# Patient Record
Sex: Male | Born: 1940
Health system: Southern US, Community
[De-identification: ages and names within clinical notes are randomized; demographics above are authoritative.]

## PROBLEM LIST (undated history)

## (undated) DIAGNOSIS — C3490 Malignant neoplasm of unspecified part of unspecified bronchus or lung: Secondary | ICD-10-CM

## (undated) DIAGNOSIS — I714 Abdominal aortic aneurysm, without rupture, unspecified: Secondary | ICD-10-CM

## (undated) DIAGNOSIS — K219 Gastro-esophageal reflux disease without esophagitis: Secondary | ICD-10-CM

## (undated) DIAGNOSIS — I10 Essential (primary) hypertension: Secondary | ICD-10-CM

## (undated) DIAGNOSIS — T7840XA Allergy, unspecified, initial encounter: Secondary | ICD-10-CM

## (undated) DIAGNOSIS — H269 Unspecified cataract: Secondary | ICD-10-CM

## (undated) DIAGNOSIS — F32A Depression, unspecified: Secondary | ICD-10-CM

## (undated) DIAGNOSIS — G4733 Obstructive sleep apnea (adult) (pediatric): Secondary | ICD-10-CM

## (undated) DIAGNOSIS — R3915 Urgency of urination: Secondary | ICD-10-CM

## (undated) DIAGNOSIS — R7301 Impaired fasting glucose: Secondary | ICD-10-CM

## (undated) DIAGNOSIS — Z95818 Presence of other cardiac implants and grafts: Secondary | ICD-10-CM

## (undated) DIAGNOSIS — I700 Atherosclerosis of aorta: Secondary | ICD-10-CM

## (undated) DIAGNOSIS — F329 Major depressive disorder, single episode, unspecified: Secondary | ICD-10-CM

## (undated) DIAGNOSIS — Z86010 Personal history of colonic polyps: Secondary | ICD-10-CM

## (undated) DIAGNOSIS — Z85118 Personal history of other malignant neoplasm of bronchus and lung: Secondary | ICD-10-CM

## (undated) DIAGNOSIS — I739 Peripheral vascular disease, unspecified: Secondary | ICD-10-CM

## (undated) DIAGNOSIS — I509 Heart failure, unspecified: Secondary | ICD-10-CM

## (undated) DIAGNOSIS — M1990 Unspecified osteoarthritis, unspecified site: Secondary | ICD-10-CM

## (undated) DIAGNOSIS — J439 Emphysema, unspecified: Secondary | ICD-10-CM

## (undated) DIAGNOSIS — E0590 Thyrotoxicosis, unspecified without thyrotoxic crisis or storm: Secondary | ICD-10-CM

## (undated) DIAGNOSIS — Z85828 Personal history of other malignant neoplasm of skin: Secondary | ICD-10-CM

## (undated) DIAGNOSIS — C189 Malignant neoplasm of colon, unspecified: Secondary | ICD-10-CM

## (undated) DIAGNOSIS — Z8709 Personal history of other diseases of the respiratory system: Secondary | ICD-10-CM

## (undated) DIAGNOSIS — K5900 Constipation, unspecified: Secondary | ICD-10-CM

## (undated) DIAGNOSIS — M4800 Spinal stenosis, site unspecified: Secondary | ICD-10-CM

## (undated) DIAGNOSIS — J449 Chronic obstructive pulmonary disease, unspecified: Secondary | ICD-10-CM

## (undated) DIAGNOSIS — C4491 Basal cell carcinoma of skin, unspecified: Secondary | ICD-10-CM

## (undated) DIAGNOSIS — T4145XA Adverse effect of unspecified anesthetic, initial encounter: Secondary | ICD-10-CM

## (undated) DIAGNOSIS — E785 Hyperlipidemia, unspecified: Secondary | ICD-10-CM

## (undated) DIAGNOSIS — N329 Bladder disorder, unspecified: Secondary | ICD-10-CM

## (undated) DIAGNOSIS — R3129 Other microscopic hematuria: Secondary | ICD-10-CM

## (undated) DIAGNOSIS — F419 Anxiety disorder, unspecified: Secondary | ICD-10-CM

## (undated) DIAGNOSIS — I4891 Unspecified atrial fibrillation: Secondary | ICD-10-CM

## (undated) DIAGNOSIS — J90 Pleural effusion, not elsewhere classified: Secondary | ICD-10-CM

## (undated) DIAGNOSIS — I5032 Chronic diastolic (congestive) heart failure: Secondary | ICD-10-CM

## (undated) DIAGNOSIS — Z9889 Other specified postprocedural states: Secondary | ICD-10-CM

## (undated) DIAGNOSIS — G4730 Sleep apnea, unspecified: Secondary | ICD-10-CM

## (undated) DIAGNOSIS — I639 Cerebral infarction, unspecified: Secondary | ICD-10-CM

## (undated) DIAGNOSIS — N400 Enlarged prostate without lower urinary tract symptoms: Secondary | ICD-10-CM

## (undated) DIAGNOSIS — T8859XA Other complications of anesthesia, initial encounter: Secondary | ICD-10-CM

## (undated) DIAGNOSIS — N189 Chronic kidney disease, unspecified: Secondary | ICD-10-CM

## (undated) DIAGNOSIS — I6523 Occlusion and stenosis of bilateral carotid arteries: Secondary | ICD-10-CM

## (undated) HISTORY — DX: Personal history of colonic polyps: Z86.010

## (undated) HISTORY — DX: Cerebral infarction, unspecified: I63.9

## (undated) HISTORY — DX: Chronic obstructive pulmonary disease, unspecified: J44.9

## (undated) HISTORY — DX: Obstructive sleep apnea (adult) (pediatric): G47.33

## (undated) HISTORY — DX: Hyperlipidemia, unspecified: E78.5

## (undated) HISTORY — DX: Unspecified cataract: H26.9

## (undated) HISTORY — PX: CATARACT EXTRACTION W/ INTRAOCULAR LENS  IMPLANT, BILATERAL: SHX1307

## (undated) HISTORY — DX: Sleep apnea, unspecified: G47.30

## (undated) HISTORY — DX: Allergy, unspecified, initial encounter: T78.40XA

## (undated) HISTORY — PX: TRANSTHORACIC ECHOCARDIOGRAM: SHX275

## (undated) HISTORY — DX: Emphysema, unspecified: J43.9

## (undated) HISTORY — DX: Unspecified atrial fibrillation: I48.91

## (undated) HISTORY — PX: COLONOSCOPY: SHX174

## (undated) HISTORY — PX: FEMORAL-POPLITEAL BYPASS GRAFT: SHX937

## (undated) HISTORY — PX: BASAL CELL CARCINOMA EXCISION: SHX1214

## (undated) HISTORY — PX: OTHER SURGICAL HISTORY: SHX169

## (undated) HISTORY — DX: Impaired fasting glucose: R73.01

## (undated) HISTORY — PX: EYE SURGERY: SHX253

## (undated) HISTORY — PX: POLYPECTOMY: SHX149

## (undated) HISTORY — DX: Peripheral vascular disease, unspecified: I73.9

## (undated) HISTORY — PX: CARDIOVASCULAR STRESS TEST: SHX262

## (undated) HISTORY — DX: Constipation, unspecified: K59.00

## (undated) HISTORY — DX: Spinal stenosis, site unspecified: M48.00

---

## 1998-07-11 ENCOUNTER — Ambulatory Visit (HOSPITAL_COMMUNITY): Admission: RE | Admit: 1998-07-11 | Discharge: 1998-07-11 | Payer: Self-pay | Admitting: Thoracic Surgery

## 1998-07-11 ENCOUNTER — Encounter: Payer: Self-pay | Admitting: Thoracic Surgery

## 1999-10-31 ENCOUNTER — Ambulatory Visit: Admission: RE | Admit: 1999-10-31 | Discharge: 1999-10-31 | Payer: Self-pay | Admitting: Vascular Surgery

## 2002-07-23 ENCOUNTER — Encounter: Payer: Self-pay | Admitting: Vascular Surgery

## 2002-07-27 ENCOUNTER — Ambulatory Visit (HOSPITAL_COMMUNITY): Admission: RE | Admit: 2002-07-27 | Discharge: 2002-07-27 | Payer: Self-pay | Admitting: Vascular Surgery

## 2002-07-27 HISTORY — PX: AORTOGRAM: SHX6300

## 2002-08-06 ENCOUNTER — Encounter: Payer: Self-pay | Admitting: Emergency Medicine

## 2002-08-06 ENCOUNTER — Emergency Department (HOSPITAL_COMMUNITY): Admission: EM | Admit: 2002-08-06 | Discharge: 2002-08-06 | Payer: Self-pay | Admitting: Emergency Medicine

## 2003-02-09 ENCOUNTER — Inpatient Hospital Stay (HOSPITAL_COMMUNITY): Admission: AD | Admit: 2003-02-09 | Discharge: 2003-02-10 | Payer: Self-pay | Admitting: Internal Medicine

## 2003-02-10 ENCOUNTER — Encounter: Payer: Self-pay | Admitting: Internal Medicine

## 2003-08-18 ENCOUNTER — Ambulatory Visit (HOSPITAL_COMMUNITY): Admission: RE | Admit: 2003-08-18 | Discharge: 2003-08-18 | Payer: Self-pay | Admitting: Specialist

## 2003-10-02 DIAGNOSIS — Z86010 Personal history of colonic polyps: Secondary | ICD-10-CM

## 2003-10-02 DIAGNOSIS — Z860101 Personal history of adenomatous and serrated colon polyps: Secondary | ICD-10-CM

## 2003-10-02 HISTORY — DX: Personal history of colonic polyps: Z86.010

## 2003-10-02 HISTORY — DX: Personal history of adenomatous and serrated colon polyps: Z86.0101

## 2004-05-18 ENCOUNTER — Emergency Department (HOSPITAL_COMMUNITY): Admission: EM | Admit: 2004-05-18 | Discharge: 2004-05-18 | Payer: Self-pay | Admitting: Emergency Medicine

## 2004-06-26 ENCOUNTER — Encounter: Admission: RE | Admit: 2004-06-26 | Discharge: 2004-06-26 | Payer: Self-pay | Admitting: Otolaryngology

## 2004-06-27 ENCOUNTER — Ambulatory Visit (HOSPITAL_BASED_OUTPATIENT_CLINIC_OR_DEPARTMENT_OTHER): Admission: RE | Admit: 2004-06-27 | Discharge: 2004-06-27 | Payer: Self-pay | Admitting: Otolaryngology

## 2004-06-27 ENCOUNTER — Encounter (INDEPENDENT_AMBULATORY_CARE_PROVIDER_SITE_OTHER): Payer: Self-pay | Admitting: Specialist

## 2004-06-27 ENCOUNTER — Ambulatory Visit (HOSPITAL_COMMUNITY): Admission: RE | Admit: 2004-06-27 | Discharge: 2004-06-27 | Payer: Self-pay | Admitting: Otolaryngology

## 2004-06-27 HISTORY — PX: LARYNGOSCOPY: SUR817

## 2004-08-23 ENCOUNTER — Ambulatory Visit: Payer: Self-pay | Admitting: Internal Medicine

## 2004-09-27 ENCOUNTER — Ambulatory Visit: Payer: Self-pay | Admitting: Internal Medicine

## 2004-12-04 ENCOUNTER — Ambulatory Visit: Payer: Self-pay | Admitting: Internal Medicine

## 2004-12-19 ENCOUNTER — Ambulatory Visit: Payer: Self-pay | Admitting: Internal Medicine

## 2005-05-03 ENCOUNTER — Ambulatory Visit: Payer: Self-pay | Admitting: Internal Medicine

## 2005-10-01 DIAGNOSIS — R7301 Impaired fasting glucose: Secondary | ICD-10-CM

## 2005-10-01 HISTORY — DX: Impaired fasting glucose: R73.01

## 2005-12-19 ENCOUNTER — Ambulatory Visit: Payer: Self-pay | Admitting: Internal Medicine

## 2006-01-15 ENCOUNTER — Ambulatory Visit: Payer: Self-pay | Admitting: Cardiology

## 2006-02-04 ENCOUNTER — Encounter: Admission: RE | Admit: 2006-02-04 | Discharge: 2006-02-04 | Payer: Self-pay | Admitting: Internal Medicine

## 2006-05-20 ENCOUNTER — Encounter: Admission: RE | Admit: 2006-05-20 | Discharge: 2006-05-20 | Payer: Self-pay | Admitting: Internal Medicine

## 2006-07-02 ENCOUNTER — Ambulatory Visit: Payer: Self-pay | Admitting: Internal Medicine

## 2006-07-29 ENCOUNTER — Ambulatory Visit: Payer: Self-pay | Admitting: Internal Medicine

## 2006-07-29 LAB — CONVERTED CEMR LAB
ALT: 23 units/L (ref 0–40)
AST: 20 units/L (ref 0–37)
Eosinophil percent: 2.1 % (ref 0.0–5.0)
Free T4: 0.9 ng/dL (ref 0.9–1.8)
HCT: 42.5 % (ref 39.0–52.0)
Lymphocytes Relative: 28.6 % (ref 12.0–46.0)
MCHC: 34.2 g/dL (ref 30.0–36.0)
MCV: 93.1 fL (ref 78.0–100.0)
Monocytes Absolute: 0.5 10*3/uL (ref 0.2–0.7)
Monocytes Relative: 8.6 % (ref 3.0–11.0)
Neutrophils Relative %: 60.1 % (ref 43.0–77.0)
RDW: 12.6 % (ref 11.5–14.6)
T3, Free: 3 pg/mL (ref 2.3–4.2)
TSH: 0.96 microintl units/mL (ref 0.35–5.50)
WBC: 5.8 10*3/uL (ref 4.5–10.5)

## 2006-10-11 ENCOUNTER — Ambulatory Visit: Payer: Self-pay | Admitting: Internal Medicine

## 2006-10-11 LAB — CONVERTED CEMR LAB
Albumin: 3.5 g/dL (ref 3.5–5.2)
Alkaline Phosphatase: 59 units/L (ref 39–117)
Chol/HDL Ratio, serum: 4
Cholesterol: 121 mg/dL (ref 0–200)

## 2006-10-30 DIAGNOSIS — H409 Unspecified glaucoma: Secondary | ICD-10-CM | POA: Insufficient documentation

## 2007-05-01 ENCOUNTER — Encounter: Payer: Self-pay | Admitting: Internal Medicine

## 2007-05-01 ENCOUNTER — Ambulatory Visit: Payer: Self-pay | Admitting: Internal Medicine

## 2007-05-08 ENCOUNTER — Encounter: Payer: Self-pay | Admitting: Internal Medicine

## 2007-05-15 ENCOUNTER — Encounter: Payer: Self-pay | Admitting: Internal Medicine

## 2007-05-15 ENCOUNTER — Ambulatory Visit: Payer: Self-pay | Admitting: Internal Medicine

## 2007-05-15 LAB — HM COLONOSCOPY

## 2007-05-19 ENCOUNTER — Encounter: Payer: Self-pay | Admitting: Internal Medicine

## 2007-05-20 ENCOUNTER — Encounter: Payer: Self-pay | Admitting: Internal Medicine

## 2007-07-22 ENCOUNTER — Ambulatory Visit: Payer: Self-pay | Admitting: Vascular Surgery

## 2007-08-04 ENCOUNTER — Telehealth (INDEPENDENT_AMBULATORY_CARE_PROVIDER_SITE_OTHER): Payer: Self-pay | Admitting: *Deleted

## 2007-09-08 ENCOUNTER — Telehealth (INDEPENDENT_AMBULATORY_CARE_PROVIDER_SITE_OTHER): Payer: Self-pay | Admitting: *Deleted

## 2007-09-09 ENCOUNTER — Telehealth (INDEPENDENT_AMBULATORY_CARE_PROVIDER_SITE_OTHER): Payer: Self-pay | Admitting: *Deleted

## 2007-09-19 ENCOUNTER — Ambulatory Visit: Payer: Self-pay | Admitting: Internal Medicine

## 2007-09-29 ENCOUNTER — Ambulatory Visit: Payer: Self-pay | Admitting: Internal Medicine

## 2007-09-29 DIAGNOSIS — E782 Mixed hyperlipidemia: Secondary | ICD-10-CM | POA: Insufficient documentation

## 2007-09-29 DIAGNOSIS — J383 Other diseases of vocal cords: Secondary | ICD-10-CM | POA: Insufficient documentation

## 2007-09-29 DIAGNOSIS — R0989 Other specified symptoms and signs involving the circulatory and respiratory systems: Secondary | ICD-10-CM | POA: Insufficient documentation

## 2007-09-29 DIAGNOSIS — I739 Peripheral vascular disease, unspecified: Secondary | ICD-10-CM | POA: Insufficient documentation

## 2007-09-29 LAB — CONVERTED CEMR LAB
BUN: 13 mg/dL (ref 6–23)
Cholesterol, target level: 200 mg/dL
Creatinine, Ser: 1.2 mg/dL (ref 0.4–1.5)
Glucose, Bld: 115 mg/dL — ABNORMAL HIGH (ref 70–99)
LDL Cholesterol: 82 mg/dL (ref 0–99)
LDL Goal: 130 mg/dL
Potassium: 4.8 meq/L (ref 3.5–5.1)

## 2007-09-30 ENCOUNTER — Encounter (INDEPENDENT_AMBULATORY_CARE_PROVIDER_SITE_OTHER): Payer: Self-pay | Admitting: *Deleted

## 2007-11-13 ENCOUNTER — Telehealth (INDEPENDENT_AMBULATORY_CARE_PROVIDER_SITE_OTHER): Payer: Self-pay | Admitting: *Deleted

## 2007-12-23 ENCOUNTER — Encounter: Payer: Self-pay | Admitting: Internal Medicine

## 2008-01-13 ENCOUNTER — Ambulatory Visit: Payer: Self-pay | Admitting: Vascular Surgery

## 2008-01-15 ENCOUNTER — Ambulatory Visit: Payer: Self-pay | Admitting: Internal Medicine

## 2008-01-15 DIAGNOSIS — F17200 Nicotine dependence, unspecified, uncomplicated: Secondary | ICD-10-CM | POA: Insufficient documentation

## 2008-01-15 DIAGNOSIS — J449 Chronic obstructive pulmonary disease, unspecified: Secondary | ICD-10-CM | POA: Insufficient documentation

## 2008-01-15 DIAGNOSIS — N400 Enlarged prostate without lower urinary tract symptoms: Secondary | ICD-10-CM | POA: Insufficient documentation

## 2008-01-19 ENCOUNTER — Encounter (INDEPENDENT_AMBULATORY_CARE_PROVIDER_SITE_OTHER): Payer: Self-pay | Admitting: *Deleted

## 2008-01-28 ENCOUNTER — Encounter (INDEPENDENT_AMBULATORY_CARE_PROVIDER_SITE_OTHER): Payer: Self-pay | Admitting: *Deleted

## 2008-02-10 ENCOUNTER — Encounter: Payer: Self-pay | Admitting: Internal Medicine

## 2008-06-29 ENCOUNTER — Telehealth (INDEPENDENT_AMBULATORY_CARE_PROVIDER_SITE_OTHER): Payer: Self-pay | Admitting: *Deleted

## 2008-07-21 ENCOUNTER — Ambulatory Visit: Payer: Self-pay | Admitting: Vascular Surgery

## 2008-09-30 ENCOUNTER — Ambulatory Visit: Payer: Self-pay | Admitting: Vascular Surgery

## 2008-12-03 ENCOUNTER — Ambulatory Visit: Payer: Self-pay | Admitting: Vascular Surgery

## 2009-02-16 ENCOUNTER — Ambulatory Visit: Payer: Self-pay | Admitting: Vascular Surgery

## 2009-03-02 ENCOUNTER — Ambulatory Visit: Payer: Self-pay | Admitting: Internal Medicine

## 2009-03-02 DIAGNOSIS — E1351 Other specified diabetes mellitus with diabetic peripheral angiopathy without gangrene: Secondary | ICD-10-CM | POA: Insufficient documentation

## 2009-03-02 DIAGNOSIS — Z86010 Personal history of colonic polyps: Secondary | ICD-10-CM | POA: Insufficient documentation

## 2009-03-02 DIAGNOSIS — R269 Unspecified abnormalities of gait and mobility: Secondary | ICD-10-CM | POA: Insufficient documentation

## 2009-03-02 DIAGNOSIS — R7303 Prediabetes: Secondary | ICD-10-CM | POA: Insufficient documentation

## 2009-03-02 DIAGNOSIS — E119 Type 2 diabetes mellitus without complications: Secondary | ICD-10-CM | POA: Insufficient documentation

## 2009-03-07 ENCOUNTER — Encounter (INDEPENDENT_AMBULATORY_CARE_PROVIDER_SITE_OTHER): Payer: Self-pay | Admitting: *Deleted

## 2009-05-12 ENCOUNTER — Ambulatory Visit: Payer: Self-pay | Admitting: Family Medicine

## 2009-08-09 ENCOUNTER — Ambulatory Visit: Payer: Self-pay | Admitting: Vascular Surgery

## 2009-09-08 ENCOUNTER — Ambulatory Visit: Payer: Self-pay | Admitting: Vascular Surgery

## 2009-10-05 ENCOUNTER — Ambulatory Visit: Payer: Self-pay | Admitting: Internal Medicine

## 2010-01-02 ENCOUNTER — Telehealth (INDEPENDENT_AMBULATORY_CARE_PROVIDER_SITE_OTHER): Payer: Self-pay | Admitting: *Deleted

## 2010-02-06 ENCOUNTER — Ambulatory Visit: Payer: Self-pay | Admitting: Vascular Surgery

## 2010-02-20 ENCOUNTER — Encounter: Payer: Self-pay | Admitting: Internal Medicine

## 2010-03-02 ENCOUNTER — Telehealth (INDEPENDENT_AMBULATORY_CARE_PROVIDER_SITE_OTHER): Payer: Self-pay | Admitting: *Deleted

## 2010-03-14 ENCOUNTER — Ambulatory Visit: Payer: Self-pay | Admitting: Internal Medicine

## 2010-03-17 ENCOUNTER — Telehealth (INDEPENDENT_AMBULATORY_CARE_PROVIDER_SITE_OTHER): Payer: Self-pay | Admitting: *Deleted

## 2010-04-18 ENCOUNTER — Ambulatory Visit: Payer: Self-pay | Admitting: Internal Medicine

## 2010-04-27 LAB — CONVERTED CEMR LAB
Albumin: 4.1 g/dL (ref 3.5–5.2)
Bilirubin, Direct: 0.1 mg/dL (ref 0.0–0.3)
CO2: 31 meq/L (ref 19–32)
Calcium: 9.2 mg/dL (ref 8.4–10.5)
Eosinophils Absolute: 0.1 10*3/uL (ref 0.0–0.7)
Eosinophils Relative: 2.1 % (ref 0.0–5.0)
Glucose, Bld: 101 mg/dL — ABNORMAL HIGH (ref 70–99)
HDL: 36.3 mg/dL — ABNORMAL LOW (ref >39.00)
LDL Cholesterol: 79 mg/dL (ref 0–99)
Lymphocytes Relative: 29.1 % (ref 12.0–46.0)
Lymphs Abs: 1.5 10*3/uL (ref 0.7–4.0)
MCHC: 34.5 g/dL (ref 30.0–36.0)
MCV: 94.3 fL (ref 78.0–100.0)
Monocytes Absolute: 0.5 10*3/uL (ref 0.1–1.0)
Neutro Abs: 2.9 10*3/uL (ref 1.4–7.7)
Neutrophils Relative %: 57.4 % (ref 43.0–77.0)
Platelets: 192 10*3/uL (ref 150.0–400.0)
RBC: 4.63 M/uL (ref 4.22–5.81)
Sodium: 142 meq/L (ref 135–145)
TSH: 1.65 microintl units/mL (ref 0.35–5.50)
Total Bilirubin: 0.6 mg/dL (ref 0.3–1.2)
Triglycerides: 150 mg/dL — ABNORMAL HIGH (ref 0.0–149.0)
VLDL: 30 mg/dL (ref 0.0–40.0)
WBC: 5.1 10*3/uL (ref 4.5–10.5)

## 2010-04-28 ENCOUNTER — Ambulatory Visit: Payer: Self-pay | Admitting: Vascular Surgery

## 2010-07-28 ENCOUNTER — Ambulatory Visit: Payer: Self-pay | Admitting: Internal Medicine

## 2010-07-28 DIAGNOSIS — J309 Allergic rhinitis, unspecified: Secondary | ICD-10-CM | POA: Insufficient documentation

## 2010-10-16 ENCOUNTER — Ambulatory Visit
Admission: RE | Admit: 2010-10-16 | Discharge: 2010-10-16 | Payer: Self-pay | Source: Home / Self Care | Attending: Internal Medicine | Admitting: Internal Medicine

## 2010-10-22 ENCOUNTER — Encounter: Payer: Self-pay | Admitting: Internal Medicine

## 2010-10-25 ENCOUNTER — Ambulatory Visit: Admit: 2010-10-25 | Payer: Self-pay | Admitting: Vascular Surgery

## 2010-10-29 LAB — CONVERTED CEMR LAB
ALT: 28 units/L (ref 0–53)
AST: 36 units/L (ref 0–37)
BUN: 13 mg/dL (ref 6–23)
CO2: 25 meq/L (ref 19–32)
Chloride: 106 meq/L (ref 96–112)
Cholesterol: 141 mg/dL (ref 0–200)
Creatinine, Ser: 0.9 mg/dL (ref 0.4–1.5)
Eosinophils Absolute: 0.1 10*3/uL (ref 0.0–0.7)
Glucose, Bld: 96 mg/dL (ref 70–99)
HCT: 42.6 % (ref 39.0–52.0)
HDL: 37.7 mg/dL — ABNORMAL LOW (ref >39.00)
Hgb A1c MFr Bld: 5.8 % (ref 4.6–6.5)
Hgb A1c MFr Bld: 5.9 % (ref 4.6–6.0)
LDL Cholesterol: 79 mg/dL (ref 0–99)
MCV: 93.9 fL (ref 78.0–100.0)
Monocytes Absolute: 0.2 10*3/uL (ref 0.1–1.0)
Neutrophils Relative %: 63.2 % (ref 43.0–77.0)
PSA: 1.53 ng/mL (ref 0.10–4.00)
Potassium: 4.6 meq/L (ref 3.5–5.1)
RBC: 4.54 M/uL (ref 4.22–5.81)
RDW: 12.9 % (ref 11.5–14.6)
Total CHOL/HDL Ratio: 4
Triglycerides: 120 mg/dL (ref 0.0–149.0)
VLDL: 24 mg/dL (ref 0.0–40.0)

## 2010-10-31 ENCOUNTER — Ambulatory Visit
Admission: RE | Admit: 2010-10-31 | Discharge: 2010-10-31 | Payer: Self-pay | Source: Home / Self Care | Attending: Internal Medicine | Admitting: Internal Medicine

## 2010-10-31 ENCOUNTER — Encounter: Payer: Self-pay | Admitting: Internal Medicine

## 2010-10-31 DIAGNOSIS — R918 Other nonspecific abnormal finding of lung field: Secondary | ICD-10-CM | POA: Insufficient documentation

## 2010-10-31 NOTE — Assessment & Plan Note (Signed)
Summary: cpx//lch   Vital Signs:  Patient profile:   70 year old male Height:      72.72 inches Weight:      197.8 pounds BMI:     26.39 Temp:     97.6 degrees F oral Pulse rate:   72 / minute Resp:     16 per minute BP sitting:   112 / 68  (left arm) Cuff size:   large  Vitals Entered By: Shonna Chock (March 14, 2010 3:53 PM) CC: Yearly Follow-up and order for labs (near future). Patient states he had EKG with in the past 12 months Comments REVIEWED MED LIST, PATIENT AGREED DOSE AND INSTRUCTION CORRECT  **Patient states he had pneumonia vaccine    CC:  Yearly Follow-up and order for labs (near future). Patient states he had EKG with in the past 12 months.  History of Present Illness: Here for Medicare AWV:  1.   Risk factors based on Past M, S, F history:COAD, PVD with claudication, gait imbalance, glaucoma 2.   Physical Activities: hunting & golf not significantly limited by claudication but he uses cart. He will rest while hunting if needed. 3.   Depression/mood: denied 4.   Hearing: ability to hear conversations affected by loud environments; whisper heard @ 6 ft 5.   ADL's: no limitations 6.   Fall Risk: gait issues of ? etiology, glaucoma with decreased vision OS 7.   Home Safety: none identified 8.   Height, weight, &visual acuity:decreased vision OS from glaucoma; OD vision essentially 20/20 distant. Dr Hazle Quant seen every 2-3 months 9.   Counseling: balance concerns with visual loss impact 10.   Labs ordered based on risk factors: see orders to monitor HTN, lipids 11.           Referral Coordination: Physical Therapy evaluation 12.           Care Plan: see #  11. 13.            Cognitive Assessment: Oriented X 3; no memory ,speech or comprehension  deficits revealed    Hyperlipidemia Follow-Up      This is a 70 year old man who presents for Hyperlipidemia follow-up.  The patient denies muscle aches, GI upset, abdominal pain, flushing, itching, constipation, diarrhea,  and fatigue.  Other symptoms include exercise intolerance, dypsnea, syncope after prolonged fasting for 24 hrs, and intermittent LLE pedal edema.  The patient denies the following symptoms: chest pain/pressure and palpitations.  Compliance with medications (by patient report) has been near 100%.  Dietary compliance has been fair.  The patient reports exercising occasionally (see HPI).    Preventive Screening-Counseling & Management  Alcohol-Tobacco     Alcohol drinks/day: <1     Smoking Status: current     Smoking Cessation Counseling: yes     Packs/Day: 1.0     Tobacco Counseling: to quit use of tobacco products  Caffeine-Diet-Exercise     Caffeine use/day: 2-3 cups/ day & 1 tea or cola     Diet Comments: no diet     Does Patient Exercise: no  Comments: He is motivated as his grandchildren are "begging " him to quit & "I feel lousy(from cigarettes)"  Hep-HIV-STD-Contraception     Dental Visit-last 6 months yes  Safety-Violence-Falls     Seat Belt Use: yes     Firearms in the Home: firearms in the home     Firearm Counseling: not indicated; uses recommended firearm safety measures     Smoke  Detectors: yes     Violence in the Home: no risk noted     Sexual Abuse: no     Fall Risk: see HPI      Sexual History:  currently monogamous.        Blood Transfusions:  yes, after 1987, and prior to 2001.        Travel History:  [place] in 2009.    Allergies: 1)  ! Sulfa 2)  ! Pcn 3)  ! * Advicor 4)  ! Lipitor  Social History: Caffeine use/day:  2-3 cups/ day & 1 tea or cola Does Patient Exercise:  no Dental Care w/in 6 mos.:  yes Seat Belt Use:  yes Fall Risk:  see HPI Sexual History:  currently monogamous Blood Transfusions:  yes, after 1987, prior to 2001  Review of Systems  The patient denies anorexia, fever, weight loss, weight gain, hoarseness, prolonged cough, headaches, hemoptysis, abdominal pain, melena, hematochezia, severe indigestion/heartburn, hematuria,  incontinence, suspicious skin lesions, depression, unusual weight change, abnormal bleeding, enlarged lymph nodes, and angioedema.         Bruising on Plavix Neuro:  Complains of falling down, numbness, and tingling; denies brief paralysis, headaches, memory loss, and weakness; N&T occasionally LLE form prior surgery.  Physical Exam  General:  well-nourished,in no acute distress; alert,appropriate and cooperative throughout examination Head:  Normocephalic and atraumatic without obvious abnormalities. No apparent alopecia  Eyes:  No corneal or conjunctival inflammation noted. Vision as noted in HPI Ears:  External ear exam shows no significant lesions or deformities.  Otoscopic examination reveals  osteomata in canals. Hearing as noted in HPI Nose:  External nasal examination shows no deformity or inflammation. Nasal mucosa are pink and moist without lesions or exudates. Mouth:  Oral mucosa and oropharynx without lesions or exudates.  Teeth : broken R lower tooth Neck:  No deformities, masses, or tenderness noted. Lungs:  Normal respiratory effort, chest expands symmetrically. Lungs are clear to auscultation, no crackles or wheezes. Heart:  Normal rate and regular rhythm. S1 and S2 normal without gallop, murmur, click, rub . S4 Abdomen:  Bowel sounds positive,abdomen soft and non-tender without masses, organomegaly or hernias noted. Genitalia:  Dr Aldean Ast 11/2009 Pulses:  R and L carotid,radial  pulses are full and equal bilaterally. Decreased RLE pedal pulses > LLE Extremities:  No clubbing, cyanosis, edema, or deformity noted. Neurologic:  alert & oriented X3, strength normal in all extremities, and DTRs asymmetrical , RLE 3/4+. Tremor of head with Romberg. Normal gait. Light touch over feet normal Skin:  Intact without suspicious lesions or rashes Cervical Nodes:  No lymphadenopathy noted Axillary Nodes:  No palpable lymphadenopathy Psych:  memory intact for recent and remote, normally  interactive, good eye contact, not anxious appearing, and not depressed appearing.     Impression & Recommendations:  Problem # 1:  PREVENTIVE HEALTH CARE (ICD-V70.0)  Orders: Subsequent annual wellness visit with prevention plan (Z3664)  Problem # 2:  GAIT IMBALANCE (ICD-781.2)  Orders: Physical Therapy Referral (PT)  Problem # 3:  OBSTRUCTIVE CHRONIC BRONCHITIS WITHOUT EXACERBAT (ICD-491.20)  Problem # 4:  CIGARETTE SMOKER (ICD-305.1) He expresses desire to quit  Problem # 5:  HYPERLIPIDEMIA (ICD-272.2)  His updated medication list for this problem includes:    Crestor 20 Mg Tabs (Rosuvastatin calcium) .[name]... 1 by mouth once daily  Problem # 6:  PERIPHERAL VASCULAR DISEASE (ICD-443.9) as per [facility]  Complete Medication List: 1)  Plavix 75 Mg Tabs (Clopidogrel bisulfate) .... Take 1 tab  each day 2)  Cardura 4 Mg Tabs (Doxazosin mesylate) .... 1/2 tab once daily 3)  Crestor 20 Mg Tabs (Rosuvastatin calcium) .[name].. 1 by mouth once daily 4)  Cilostazol 100 Mg Tabs (Cilostazol) .... Take one tablet twice daily for circulation. 5)  Nasonex 50 Mcg/act Susp (Mometasone furoate) .[name].. 1-2 spray each nostril once daily prn 6)  Lorazepam 0.5 Mg Tabs (Lorazepam) .[name].. 1 every 8-12 hrs for "yips"  Other Orders: Tdap => 4yrs IM ([address]) Admin 1st Vaccine (60454)  Patient Instructions: 1)  Fasting labs @[facility]  Lab: 2)  Tobacco is very bad for your health  as we discussed. It increases MI/stroke risk 2-3 X normal & exacerbates peripheral vascular disease progression.You Should stop smoking!.Stop Smoking Tips: Choose a Quit date. Cut down before the Quit date. decide what you will do as a substitute when you feel the urge to smoke(gum,toothpick,exercise).Referral can be made to the Smoking Cessation Program. 3)  BMP ; 4)  Hepatic Panel; 5)  Lipid Panel ; 6)  TSH ; 7)  CBC w/ Diff. See Diagnoses  for Codes. Prescriptions: LORAZEPAM 0.5 MG TABS (LORAZEPAM) 1 every 8-12 hrs for "yips"   #30 x 1   Entered and Authorized by:   Marga Melnick MD   Signed by:   Marga Melnick MD on 03/14/2010   Method used:   Print then Give to Patient   RxID:   [id number] NASONEX 50 MCG/ACT  SUSP (MOMETASONE FUROATE) 1-2 spray each nostril once daily prn  #1 x 11   Entered and Authorized by:   Marga Melnick MD   Signed by:   Marga Melnick MD on 03/14/2010   Method used:   Faxed to ...       [facility]* (retail)       [address]       [place], [place]  [address]       Ph: [phone]       Fax: [phone]   RxID:   [id number] CILOSTAZOL 100 MG  TABS (CILOSTAZOL) Take one tablet twice daily for circulation.  #60 Each x 11   Entered and Authorized by:   Marga Melnick MD   Signed by:   Marga Melnick MD on 03/14/2010   Method used:   Faxed to ...       [facility]* (retail)       [address]       [place], [place]  [id number]       Ph: [phone]       Fax: [phone]   RxID:   [id number] CRESTOR 20 MG TABS (ROSUVASTATIN CALCIUM) 1 by mouth once daily  #30 x 11   Entered and Authorized by:   Marga Melnick MD   Signed by:   Marga Melnick MD on 03/14/2010   Method used:   Faxed to ...       [facility]* (retail)       [address]       [place], [place]  [id number]       Ph: [phone]       Fax: [phone]   RxID:   [id number] CARDURA 4 MG TABS (DOXAZOSIN MESYLATE) 1/2 tab once daily  #45 x 3   Entered and Authorized by:   Marga Melnick MD   Signed by:   Marga Melnick MD on 03/14/2010   Method used:   Faxed to ...       [facility]* (retail)  [address]       [place], [place]  [address]       Ph: [phone]       Fax: [phone]   RxID:   [id number] PLAVIX 75 MG  TABS (CLOPIDOGREL BISULFATE) take 1 tab each day  #30 Each x 11   Entered and Authorized by:   Marga Melnick MD   Signed by:   Marga Melnick MD on 03/14/2010   Method used:   Faxed to ...        [facility]* (retail)       [address]       [place], [place]  [address]       Ph: [phone]       Fax: [phone]   RxID:   [id number]    Immunization History:  Pneumovax Immunization History:    Pneumovax:  historical (10/02/2007)  Immunizations Administered:  Tetanus Vaccine:    Vaccine Type: Tdap    Site: right deltoid    Mfr: GlaxoSmithKline    Dose: 0.5 ml    Route: IM    Given by: Chrae Malloy    Exp. Date: 12/24/2011    Lot #: [id number]    VIS given: 08/19/07 version given March 14, 2010.

## 2010-10-31 NOTE — Assessment & Plan Note (Signed)
Summary: chest congestion/kdc   Vital Signs:  Patient profile:   70 year old male Height:      73 inches Weight:      197 pounds O2 Sat:      98 % Temp:     97.4 degrees F Pulse rate:   97 / minute Resp:     17 per minute BP sitting:   118 / 62  (left arm)  Vitals Entered By: Jeremy Johann CMA (October 05, 2009 1:13 PM) CC: chest congestion, cough x2days Comments REVIEWED MED LIST, PATIENT AGREED DOSE AND INSTRUCTION CORRECT    CC:  chest congestion and cough x2days.  History of Present Illness: Raspy feeling in throat 10/12/2009; as of 01/04  mainly NP cough with head congestion ( none today). Rx: Mucinex. PMH of recurrent bronchitis in Winter.  Allergies: 1)  ! Sulfa 2)  ! Pcn 3)  ! * Advicor 4)  ! Lipitor  Review of Systems General:  Complains of sweats; denies chills and fever. ENT:  Denies nasal congestion and sinus pressure; No facial pain , purulence, or frontal headache. Resp:  Complains of shortness of breath and wheezing; denies chest pain with inspiration and coughing up blood; Clear sputum .  Physical Exam  General:  Appears tired,in no acute distress; alert,appropriate and cooperative throughout examination Ears:  External ear exam shows no significant lesions or deformities.  Otoscopic examination reveals clear canals but osteomata, tympanic membranes are intact bilaterally without bulging, retraction, inflammation or discharge. Hearing is grossly normal bilaterally. Nose:  External nasal examination shows no deformity or inflammation. Nasal mucosa red on R with deviation Mouth:  Oral mucosa and oropharynx without lesions or exudates.  Mild pharyngeal erythema.   Lungs:  Normal respiratory effort, chest expands symmetrically. Lungs are clear to auscultation, no crackles or wheezes. Minimaly l decreased BS Cervical Nodes:  No lymphadenopathy noted Axillary Nodes:  No palpable lymphadenopathy   Impression & Recommendations:  Problem # 1:  BRONCHITIS-ACUTE  (ICD-466.0)  His updated medication list for this problem includes:    Doxycycline Hyclate 100 Mg Caps (Doxycycline hyclate) .[name]... 1 two times a day x 5 days then 1 once daily    Benzonatate 200 Mg Caps (Benzonatate) .[name]... 1 q 6-8 hrs as needed for cough  Complete Medication List: 1)  Plavix 75 Mg Tabs (Clopidogrel bisulfate) .... Take 1 tab each day 2)  Cardura 4 Mg Tabs (Doxazosin mesylate) .... 1/2 tab once daily 3)  Crestor 20 Mg Tabs (Rosuvastatin calcium) .[name].. 1 by mouth once daily-office visit and labs due 4)  Cilostazol 100 Mg Tabs (Cilostazol) .... Take one tablet twice daily for circulation. 5)  Nasonex 50 Mcg/act Susp (Mometasone furoate) .[name].. 1-2 spray each nostril once daily prn 6)  Doxycycline Hyclate 100 Mg Caps (Doxycycline hyclate) .[name].. 1 two times a day x 5 days then 1 once daily 7)  Benzonatate 200 Mg Caps (Benzonatate) .[name].. 1 q 6-8 hrs as needed for cough  Patient Instructions: 1)  Drink as much fluid as you can tolerate for the next few days. Neti pot once daily as needed for sinus congestion. Prescriptions: BENZONATATE 200 MG CAPS (BENZONATATE) 1 q 6-8 hrs as needed for cough  #20 x 0   Entered and Authorized by:   Marga Melnick MD   Signed by:   Marga Melnick MD on 10/05/2009   Method used:   Faxed to ...       [facility]* (retail)  [address]       [name], [place]  [address]       Ph: [phone]       Fax: [phone]   RxID:   [id number] DOXYCYCLINE HYCLATE 100 MG CAPS (DOXYCYCLINE HYCLATE) 1 two times a day X 5 days then 1 once daily  #15 x 0   Entered and Authorized by:   Marga Melnick MD   Signed by:   Marga Melnick MD on 10/05/2009   Method used:   Faxed to ...       [facility]* (retail)       [address]       [place], [place]  [id number]       Ph: [phone]       Fax: [phone]   RxID:   [id number]

## 2010-10-31 NOTE — Progress Notes (Signed)
Summary: Refill request  Phone Note Refill Request Call back at Home Phone [phone] Message from:  Patient  Refills Requested: Medication #1:  CRESTOR 20 MG TABS 1 by mouth once daily-OFFICE VISIT AND LABS DUE [facility]   Method Requested: Fax to Local Pharmacy Initial call taken by: Shonna Chock,  March 02, 2010 4:04 PM  Follow-up for Phone Call        Patient's wife left message on VM, they have a [employer] and need a new RX sent to pharmacy. Additional Information was left: Tried Zocor and Lipitor and Crestor is the only med that works.   Noted Follow-up by: Shonna Chock,  March 02, 2010 4:05 PM  Additional Follow-up for Phone Call Additional follow up Details #1::        I called Mrs.Lawerance Cruel and informed her that Mr.Batterman is due for a CPX and labs. Last CPX was June 2010 Additional Follow-up by: Shonna Chock,  March 02, 2010 4:09 PM    Prescriptions: CRESTOR 20 MG TABS (ROSUVASTATIN CALCIUM) 1 by mouth once daily-OFFICE VISIT AND LABS DUE  #30 Each x 0   Entered by:   Shonna Chock   Authorized by:   Marga Melnick MD   Signed by:   Shonna Chock on 03/02/2010   Method used:   Electronically to        [facility]* (retail)       [address]       [place], [place]  [address]       Ph: [phone]       Fax: [phone]   RxID:   [id number]

## 2010-10-31 NOTE — Progress Notes (Signed)
Summary: Refill Request  Phone Note Refill Request Call back at [phone] Message from:  Pharmacy on March 17, 2010 10:47 AM  Refills Requested: Medication #1:  LORAZEPAM 0.5 MG TABS 1 every 8-12 hrs for "yips".   Dosage confirmed as above?Dosage Confirmed   Supply Requested: 1 month [facility]  Next Appointment Scheduled: none Initial call taken by: Lavell Islam,  March 17, 2010 10:48 AM  Follow-up for Phone Call        Left message for patient informing him he needs to submit rx for Lorazepam that was given to him on 03/14/2010 to the pharmacy. Follow-up by: Shonna Chock,  March 17, 2010 10:52 AM

## 2010-10-31 NOTE — Assessment & Plan Note (Signed)
Summary: cough/cbs   Vital Signs:  Patient profile:   70 year old male Weight:      200.6 pounds BMI:     26.77 Temp:     97.9 degrees F oral Pulse rate:   76 / minute Resp:     17 per minute BP sitting:   134 / 82  (left arm) Cuff size:   large  Vitals Entered By: Shonna Chock CMA (July 28, 2010 2:56 PM) CC: Chronic Bronchitis, Cough   CC:  Chronic Bronchitis and Cough.  History of Present Illness: Cough      This is a 70 year old man who presents with Cough for several months.  The patient reports productive cough with clear sputum  especially in am & @ night  and exertional dyspnea, but denies pleuritic chest pain, shortness of breath @ rest , wheezing, fever, hemoptysis, and malaise.  Associated symtpoms include chronic rhinitis.  The patient denies the following symptoms: cold/URI symptoms, sore throat, nasal congestion, and acid reflux symptoms.  Ineffective prior treatments have included OTC cough medication, Mucinex with benefit.  Risk factors include history of smoking 1/2-1 ppd & COPD.  Last CXray  was 10/2009. He will be hunting in [place] for 4 days next week.  Current Medications (verified): 1)  Plavix 75 Mg  Tabs (Clopidogrel Bisulfate) .... Take 1 Tab Each Day 2)  Cardura 4 Mg Tabs (Doxazosin Mesylate) .... 1/2 Tab Once Daily 3)  Crestor 20 Mg Tabs (Rosuvastatin Calcium) .[name].. 1 By Mouth Once Daily 4)  Cilostazol 100 Mg  Tabs (Cilostazol) .... Take One Tablet Twice Daily For Circulation. 5)  Nasonex 50 Mcg/act  Susp (Mometasone Furoate) .[name].. 1-2 Spray Each Nostril Once Daily Prn 6)  Lorazepam 0.5 Mg Tabs (Lorazepam) .[name].. 1 Every 8-12 Hrs For "yips"  Allergies: 1)  ! Sulfa 2)  ! Pcn 3)  ! * Advicor 4)  ! Lipitor  Physical Exam  General:  well-nourished,in no acute distress; alert,appropriate and cooperative throughout examination Ears:  External ear exam shows no significant lesions or deformities.  Otoscopic examination reveals clear canals, tympanic  membranes are intact bilaterally without bulging, retraction, inflammation or discharge. Hearing is grossly normal bilaterally. Osteomata in canals  Nose:  External nasal examination shows no deformity or inflammation. Nasal mucosa are pink and moist without lesions or exudates. Septal deviation to R  Mouth:  Oral mucosa and oropharynx without lesions or exudates.  Teeth in good repair. Lungs:  Normal respiratory effort, chest expands symmetrically. Lungs are clear to auscultation, no crackles or wheezes but BS decreased. Heart:  normal rate, regular rhythm, no gallop, no rub, no JVD, S4   with  grade 1 /6 systolic murmur.   Extremities:  No clubbing, cyanosis. Cervical Nodes:  No lymphadenopathy noted Axillary Nodes:  No palpable lymphadenopathy   Impression & Recommendations:  Problem # 1:  COUGH (ICD-786.2)  Orders: T-2 View CXR (71020TC)  Problem # 2:  RHINITIS (ICD-477.9)  His updated medication list for this problem includes:    Nasonex 50 Mcg/act Susp (Mometasone furoate) .[name]... 1-2 spray each nostril once daily prn    Nasonex 50 Mcg/act Susp (Mometasone furoate) .[name]... 1 spray once daily - two times a day as needed after neti pot  Problem # 3:  OBSTRUCTIVE CHRONIC BRONCHITIS WITHOUT EXACERBAT (ICD-491.20)  Problem # 4:  SMOKER (ICD-305.1)  Orders: T-2 View CXR (71020TC)  Complete Medication List: 1)  Plavix 75 Mg Tabs (Clopidogrel bisulfate) .... Take 1 tab each  day 2)  Cardura 4 Mg Tabs (Doxazosin mesylate) .... 1/2 tab once daily 3)  Crestor 20 Mg Tabs (Rosuvastatin calcium) .[name].. 1 by mouth once daily 4)  Cilostazol 100 Mg Tabs (Cilostazol) .... Take one tablet twice daily for circulation. 5)  Nasonex 50 Mcg/act Susp (Mometasone furoate) .[name].. 1-2 spray each nostril once daily prn 6)  Lorazepam 0.5 Mg Tabs (Lorazepam) .[name].. 1 every 8-12 hrs for "yips" 7)  Doxycycline Hyclate 100 Mg Caps (Doxycycline hyclate) .[name].. 1 two times a day x 5 days then once daily 8)  Nasonex 50  Mcg/act Susp (Mometasone furoate) .[name].. 1 spray once daily - two times a day as needed after neti pot  Patient Instructions: 1)  Drink as much fluid as you can tolerate for the next few days. Start the Doxycycline if secretions are purulent. Prescriptions: NASONEX 50 MCG/ACT SUSP (MOMETASONE FUROATE) 1 spray once daily - two times a day as needed after Neti pot  #1 x 11   Entered and Authorized by:   Marga Melnick MD   Signed by:   Marga Melnick MD on 07/28/2010   Method used:   Faxed to ...       [facility]* (retail)       [address]       [place], [place]  [address]       Ph: [phone]       Fax: [phone]   RxID:   [id number] DOXYCYCLINE HYCLATE 100 MG CAPS (DOXYCYCLINE HYCLATE) 1 two times a day X 5 days then once daily  #15 x 0   Entered and Authorized by:   Marga Melnick MD   Signed by:   Marga Melnick MD on 07/28/2010   Method used:   Faxed to ...       [facility]* (retail)       [address]       [place], [place]  [address]       Ph: [phone]       Fax: [phone]   RxID:   [id number]    Orders Added: 1)  Est. Patient Level IV [22025] 2)  T-2 View CXR [71020TC]  Appended Document: cough/cbs Flu Vaccine Consent Questions     Do you have a history of severe allergic reactions to this vaccine? no    Any prior history of allergic reactions to egg and/or gelatin? no    Do you have a sensitivity to the preservative Thimersol? no    Do you have a past history of Guillan-Barre Syndrome? no    Do you currently have an acute febrile illness? no    Have you ever had a severe reaction to latex? no    Vaccine information given and explained to patient? yes    Are you currently pregnant? no    Lot Number:[id number]   Exp Date:03/31/2011   Site Given  Left Deltoid IM

## 2010-10-31 NOTE — Progress Notes (Signed)
  Phone Note Other Incoming   Request: Send information Summary of Call: Request received from [employer] forwarded to [employer].       

## 2010-10-31 NOTE — Medication Information (Signed)
Summary: Step Therapy Letter Regarding Crestor/[employer]  Step Therapy Letter Regarding Crestor/[employer]   Imported By: Lanelle Bal 03/07/2010 07:50:01  _____________________________________________________________________  External Attachment:    Type:   Image     Comment:   External Document

## 2010-11-01 ENCOUNTER — Telehealth (INDEPENDENT_AMBULATORY_CARE_PROVIDER_SITE_OTHER): Payer: Self-pay | Admitting: *Deleted

## 2010-11-02 ENCOUNTER — Other Ambulatory Visit: Payer: Self-pay | Admitting: Internal Medicine

## 2010-11-02 DIAGNOSIS — R059 Cough, unspecified: Secondary | ICD-10-CM

## 2010-11-02 DIAGNOSIS — R0989 Other specified symptoms and signs involving the circulatory and respiratory systems: Secondary | ICD-10-CM

## 2010-11-02 DIAGNOSIS — R05 Cough: Secondary | ICD-10-CM

## 2010-11-02 NOTE — Assessment & Plan Note (Signed)
Summary: bronchitis, copd, sinus--just got back from hunting trip///sph   Vital Signs:  Patient profile:   70 year old male Weight:      201.4 pounds BMI:     26.87 O2 Sat:      94 % on Room air Temp:     99.0 degrees F oral Pulse rate:   80 / minute Resp:     16 per minute BP sitting:   122 / 80  (left arm) Cuff size:   large  Vitals Entered By: Shonna Chock CMA (October 16, 2010 4:15 PM)  O2 Flow:  Room air CC: Cough, URI symptoms   CC:  Cough and URI symptoms.  History of Present Illness:      This is a 70 year old man who presents with  RTI symptoms.  The patient  now reports sore throat and productive cough with clear sputum, but denies purulent nasal discharge and earache.  Associated symptoms include dyspnea and wheezing.  The patient denies fever.  The patient denies frontal  headache & bilateral facial pain, tooth pain, and tender adenopathy but has had pain behind the eyes w/o discharge.   Rx: Mucinex, Clairitin D, cough drops  Current Medications (verified): 1)  Plavix 75 Mg  Tabs (Clopidogrel Bisulfate) .... Take 1 Tab Each Day 2)  Cardura 4 Mg Tabs (Doxazosin Mesylate) .... 1/2 Tab Once Daily 3)  Crestor 20 Mg Tabs (Rosuvastatin Calcium) .[name].. 1 By Mouth Once Daily 4)  Cilostazol 100 Mg  Tabs (Cilostazol) .... Take One Tablet Twice Daily For Circulation. 5)  Lorazepam 0.5 Mg Tabs (Lorazepam) .[name].. 1 Every 8-12 Hrs For "yips" 6)  Nasonex 50 Mcg/act Susp (Mometasone Furoate) .[name].. 1 Spray Once Daily - Two Times A Day As Needed After Neti Pot 7)  Lumigan 0.03 % Soln (Bimatoprost) .[name].. 1 Drop in Both Eyes At Bedtime 8)  Cosopt 22.3-6.8 Mg/ml Soln (Dorzolamide Hcl-Timolol Mal) .[name].. 1 Drop in Right Eye Two Times A Day 9)  Alphagan P 0.15 % Soln (Brimonidine Tartrate) .[name].. 1 Drop in Right Eye Three Times A Day, 1 Drop in Left Eye Two Times A Day  Allergies: 1)  ! Sulfa 2)  ! Pcn 3)  ! * Advicor 4)  ! Lipitor  Physical Exam  General:  Appears tired but in no acute  distress; alert,appropriate and cooperative throughout examination Eyes:  No corneal or conjunctival inflammation noted. EOMI. . Vision grossly normal OD; chronically decreased vision OS from Glaucoma. Ears:  External ear exam shows no significant lesions or deformities.  Otoscopic examination reveals clear canals, tympanic membranes are intact bilaterally without bulging, retraction, inflammation or discharge. Hearing is grossly normal bilaterally. Nose:  External nasal examination shows no deformity or inflammation. Nasal mucosa are  dry without lesions or exudates. Mouth:  Oral mucosa and oropharynx without lesions or exudates.  Teeth in good repair. Hoarse Lungs:  Normal respiratory effort, chest expands symmetrically. Lungs ; scattered crackles  & mild rhonchi in lower lobes w/o wheezes. Cervical Nodes:  No lymphadenopathy noted Axillary Nodes:  No palpable lymphadenopathy   Impression & Recommendations:  Problem # 1:  BRONCHITIS-ACUTE (ICD-466.0)  The following medications were removed from the medication list:    Doxycycline Hyclate 100 Mg Caps (Doxycycline hyclate) .[name]... 1 two times a day x 5 days then once daily His updated medication list for this problem includes:    Azithromycin 250 Mg Tabs (Azithromycin) .[name]... As per pack    Benzonatate 200 Mg Caps (Benzonatate) .Marland KitchenMarland KitchenMarland KitchenMarland Kitchen 1  every 6 -8 hrs as needed for cough  Orders: Prescription Created Electronically [phone])  Complete Medication List: 1)  Plavix 75 Mg Tabs (Clopidogrel bisulfate) .... Take 1 tab each day 2)  Cardura 4 Mg Tabs (Doxazosin mesylate) .... 1/2 tab once daily 3)  Crestor 20 Mg Tabs (Rosuvastatin calcium) .[name].. 1 by mouth once daily 4)  Cilostazol 100 Mg Tabs (Cilostazol) .... Take one tablet twice daily for circulation. 5)  Lorazepam 0.5 Mg Tabs (Lorazepam) .[name].. 1 every 8-12 hrs for "yips" 6)  Nasonex 50 Mcg/act Susp (Mometasone furoate) .[name].. 1 spray once daily - two times a day as needed after neti pot 7)  Lumigan  0.03 % Soln (Bimatoprost) .[name].. 1 drop in both eyes at bedtime 8)  Cosopt 22.3-6.8 Mg/ml Soln (Dorzolamide hcl-timolol mal) .[name].. 1 drop in right eye two times a day 9)  Alphagan P 0.15 % Soln (Brimonidine tartrate) .[name].. 1 drop in right eye three times a day, 1 drop in left eye two times a day 10)  Azithromycin 250 Mg Tabs (Azithromycin) .... As per pack 11)  Benzonatate 200 Mg Caps (Benzonatate) .[name].. 1 every 6 -8 hrs as needed for cough  Patient Instructions: 1)  Drink as much  NON dairy fluid as you can tolerate for the next few days. Prescriptions: BENZONATATE 200 MG CAPS (BENZONATATE) 1 every 6 -8 hrs as needed for cough  #21 x 0   Entered and Authorized by:   Marga Melnick MD   Signed by:   Marga Melnick MD on 10/16/2010   Method used:   Electronically to        [facility]* (retail)       [address]       [place], [place]  [id number]       Ph: [phone]       Fax: [phone]   RxID:   [id number] AZITHROMYCIN 250 MG TABS (AZITHROMYCIN) as per pack  #1 x 0   Entered and Authorized by:   Marga Melnick MD   Signed by:   Marga Melnick MD on 10/16/2010   Method used:   Electronically to        [facility]* (retail)       [address]       [place], [place]  [address]       Ph: [phone]       Fax: [phone]   RxID:   [id number]    Orders Added: 1)  Prescription Created Electronically [G8553] 2)  Est. Patient Level III [25427]

## 2010-11-03 ENCOUNTER — Encounter (INDEPENDENT_AMBULATORY_CARE_PROVIDER_SITE_OTHER): Payer: Self-pay | Admitting: *Deleted

## 2010-11-03 ENCOUNTER — Other Ambulatory Visit: Payer: Self-pay

## 2010-11-03 ENCOUNTER — Other Ambulatory Visit: Payer: Self-pay | Admitting: Internal Medicine

## 2010-11-03 DIAGNOSIS — T887XXA Unspecified adverse effect of drug or medicament, initial encounter: Secondary | ICD-10-CM

## 2010-11-03 DIAGNOSIS — Z01818 Encounter for other preprocedural examination: Secondary | ICD-10-CM

## 2010-11-06 ENCOUNTER — Telehealth (INDEPENDENT_AMBULATORY_CARE_PROVIDER_SITE_OTHER): Payer: Self-pay | Admitting: *Deleted

## 2010-11-07 ENCOUNTER — Ambulatory Visit (INDEPENDENT_AMBULATORY_CARE_PROVIDER_SITE_OTHER)
Admission: RE | Admit: 2010-11-07 | Discharge: 2010-11-07 | Disposition: A | Discharge status: Home / Self Care | Payer: 59 | Source: Ambulatory Visit | Attending: Internal Medicine | Admitting: Internal Medicine

## 2010-11-07 DIAGNOSIS — R0989 Other specified symptoms and signs involving the circulatory and respiratory systems: Secondary | ICD-10-CM

## 2010-11-07 DIAGNOSIS — R059 Cough, unspecified: Secondary | ICD-10-CM

## 2010-11-07 DIAGNOSIS — R918 Other nonspecific abnormal finding of lung field: Secondary | ICD-10-CM

## 2010-11-07 DIAGNOSIS — R05 Cough: Secondary | ICD-10-CM

## 2010-11-07 HISTORY — DX: Essential (primary) hypertension: I10

## 2010-11-07 MED ORDER — IOHEXOL 300 MG/ML  SOLN
80.0000 mL | Freq: Once | INTRAMUSCULAR | Status: AC | PRN
  Administered 2010-11-07: 80 mL via INTRAVENOUS

## 2010-11-08 ENCOUNTER — Encounter: Payer: Self-pay | Admitting: Internal Medicine

## 2010-11-08 DIAGNOSIS — C3490 Malignant neoplasm of unspecified part of unspecified bronchus or lung: Secondary | ICD-10-CM | POA: Insufficient documentation

## 2010-11-08 NOTE — Assessment & Plan Note (Signed)
Summary: COUGH AND COLD NOT GETTING ANY BETTER//PH   Vital Signs:  Patient profile:   70 year old male Weight:      200.2 pounds BMI:     26.71 Temp:     98.2 degrees F oral Pulse rate:   84 / minute BP sitting:   140 / 82  (left arm) Cuff size:   large  Vitals Entered By: Shonna Chock CMA (October 31, 2010 4:25 PM) CC: Ongoing symptoms , Cough   CC:  Ongoing symptoms  and Cough.  History of Present Illness: Cough      This is a 70 year old man who presents with Cough.  The patient reports productive cough and wheezing, but denies pleuritic chest pain, fever, and hemoptysis.  The patient denies the following symptoms: cold/URI symptoms.  The cough is worse with lying down and  heat exposure.  CXray : ?? R hilar enlargement , findings discussed.   Current Medications (verified): 1)  Plavix 75 Mg  Tabs (Clopidogrel Bisulfate) .... Take 1 Tab Each Day 2)  Cardura 4 Mg Tabs (Doxazosin Mesylate) .... 1/2 Tab Once Daily 3)  Crestor 20 Mg Tabs (Rosuvastatin Calcium) .[name].. 1 By Mouth Once Daily 4)  Cilostazol 100 Mg  Tabs (Cilostazol) .... Take One Tablet Twice Daily For Circulation. 5)  Lorazepam 0.5 Mg Tabs (Lorazepam) .[name].. 1 Every 8-12 Hrs For "yips" 6)  Nasonex 50 Mcg/act Susp (Mometasone Furoate) .[name].. 1 Spray Once Daily - Two Times A Day As Needed After Neti Pot 7)  Lumigan 0.03 % Soln (Bimatoprost) .[name].. 1 Drop in Both Eyes At Bedtime 8)  Cosopt 22.3-6.8 Mg/ml Soln (Dorzolamide Hcl-Timolol Mal) .[name].. 1 Drop in Right Eye Two Times A Day 9)  Alphagan P 0.15 % Soln (Brimonidine Tartrate) .[name].. 1 Drop in Right Eye Three Times A Day, 1 Drop in Left Eye Two Times A Day 10)  Benzonatate 200 Mg Caps (Benzonatate) .[name].. 1 Every 6 -8 Hrs As Needed For Cough  Allergies: 1)  ! Sulfa 2)  ! Pcn 3)  ! * Advicor 4)  ! Lipitor  Physical Exam  General:  well-nourished,in no acute distress; alert,appropriate and cooperative throughout examination Ears:  External ear exam shows no significant  lesions or deformities.  Otoscopic examination reveals clear canals, tympanic membranes are intact bilaterally without bulging, retraction, inflammation or discharge.  Osteomata in canals Nose:  External nasal examination shows no deformity or inflammation. Nasal mucosa are pink and moist without lesions or exudates. Mouth:  Oral mucosa and oropharynx without lesions or exudates.  Teeth in good repair. Lungs:  Normal respiratory effort, chest expands symmetrically. Lungs : mild crackles  @ bases w/o  wheezes. Expiratory wheezing when [facility] demonsrated Cervical Nodes:  No lymphadenopathy noted Axillary Nodes:  No palpable lymphadenopathy   Impression & Recommendations:  Problem # 1:  COUGH (ICD-786.2)  Orders: Radiology Referral (Radiology)  Problem # 2:  OTHER NONSPECIFIC ABNORMAL FINDING OF LUNG FIELD (ICD-793.19)  ? R hilar enlargement  Orders: Radiology Referral (Radiology)  Problem # 3:  BRONCHITIS-ACUTE (ICD-466.0) clinically resolved The following medications were removed from the medication list:    Azithromycin 250 Mg Tabs (Azithromycin) .[name]... As per pack His updated medication list for this problem includes:    Benzonatate 200 Mg Caps (Benzonatate) .[name]... 1 every 6 -8 hrs as needed for cough    Qvar 80 Mcg/act Aers (Beclomethasone dipropionate) .[name]... 1-2 puffs every 12 hrs ; gargle & spit after use  Problem # 4:  SMOKER (  ICD-305.1)  pathophysiology & risks discussed  Orders: Radiology Referral (Radiology)  Complete Medication List: 1)  Plavix 75 Mg Tabs (Clopidogrel bisulfate) .... Take 1 tab each day 2)  Cardura 4 Mg Tabs (Doxazosin mesylate) .... 1/2 tab once daily 3)  Crestor 20 Mg Tabs (Rosuvastatin calcium) .[name].. 1 by mouth once daily 4)  Cilostazol 100 Mg Tabs (Cilostazol) .... Take one tablet twice daily for circulation. 5)  Lorazepam 0.5 Mg Tabs (Lorazepam) .[name].. 1 every 8-12 hrs for "yips" 6)  Nasonex 50 Mcg/act Susp (Mometasone furoate) .[name].. 1 spray once  daily - two times a day as needed after neti pot 7)  Lumigan 0.03 % Soln (Bimatoprost) .[name].. 1 drop in both eyes at bedtime 8)  Cosopt 22.3-6.8 Mg/ml Soln (Dorzolamide hcl-timolol mal) .[name].. 1 drop in right eye two times a day 9)  Alphagan P 0.15 % Soln (Brimonidine tartrate) .[name].. 1 drop in right eye three times a day, 1 drop in left eye two times a day 10)  Benzonatate 200 Mg Caps (Benzonatate) .[name].. 1 every 6 -8 hrs as needed for cough 11)  Qvar 80 Mcg/act Aers (Beclomethasone dipropionate) .[name].. 1-2 puffs every 12 hrs ; gargle & spit after use  Patient Instructions: 1)  Stop Smoking Tips: Choose a Quit date. Cut down before the Quit date. decide what you will do as a substitute when you feel the urge to smoke(gum,toothpick,exercise).report fever & purulence. 2)  Drink  room temp water if coughing spells. Prescriptions: QVAR 80 MCG/ACT AERS (BECLOMETHASONE DIPROPIONATE) 1-2 puffs every 12 hrs ; gargle & spit after use  #1 x 5   Entered and Authorized by:   Marga Melnick MD   Signed by:   Marga Melnick MD on 10/31/2010   Method used:   Print then Give to Patient   RxID:   [id number]    Orders Added: 1)  Est. Patient Level III [74259] 2)  Radiology Referral [Radiology]

## 2010-11-08 NOTE — Miscellaneous (Signed)
Summary: Orders Update   Clinical Lists Changes  Orders: Added new Test order of T-2 View CXR (71020TC) - Signed    I spoke with patient's wife and informed her to have patient go to [facility] for xray as early as he can so that report is avaliable when he comes in at 4pm .Shonna Chock CMA  October 31, 2010 9:10 AM

## 2010-11-08 NOTE — Progress Notes (Signed)
Summary: Refill Request  Phone Note Refill Request Call back at [phone] Message from:  Pharmacy on November 01, 2010 10:29 AM  Refills Requested: Medication #1:  BENZONATATE 200 MG CAPS 1 every 6 -8 hrs as needed for cough   Dosage confirmed as above?Dosage Confirmed   Supply Requested: 21   Last Refilled: 10/16/2010  Medication #2:  Beconase AQ 0.042% SPRAY   Supply Requested: [facility]  Next Appointment Scheduled: none Initial call taken by: Harold Barban,  November 01, 2010 10:30 AM  Follow-up for Phone Call        Dr.Hopper please advise, patient was seen yesterday and given Qvar, would you like for patient  to continue with this cough med also? Follow-up by: Shonna Chock CMA,  November 01, 2010 10:37 AM  Additional Follow-up for Phone Call Additional follow up Details #1::        yes ,use QVAR 2 puffs two times a day ; refill other 2 to pharmacy of choice Additional Follow-up by: Marga Melnick MD,  November 01, 2010 12:27 PM    New/Updated Medications: BECONASE AQ 42 MCG/SPRAY SUSP (BECLOMETHASONE DIPROP MONOHYD) 1-2 sprays in each nostril two times a day Prescriptions: BECONASE AQ 42 MCG/SPRAY SUSP (BECLOMETHASONE DIPROP MONOHYD) 1-2 sprays in each nostril two times a day  #1 x 1   Entered by:   Shonna Chock CMA   Authorized by:   Marga Melnick MD   Signed by:   Shonna Chock CMA on 11/02/2010   Method used:   Electronically to        [facility]* (retail)       [address]       [place], [place]  [id number]       Ph: [phone]       Fax: [phone]   RxID:   [id number] BENZONATATE 200 MG CAPS (BENZONATATE) 1 every 6 -8 hrs as needed for cough  #21 x 0   Entered by:   Shonna Chock CMA   Authorized by:   Marga Melnick MD   Signed by:   Shonna Chock CMA on 11/02/2010   Method used:   Electronically to        [facility]* (retail)       [address]       [place], [place]  [address]       Ph: [phone]       Fax: [phone]   RxID:   [id number]

## 2010-11-15 ENCOUNTER — Encounter (INDEPENDENT_AMBULATORY_CARE_PROVIDER_SITE_OTHER): Payer: 59

## 2010-11-15 ENCOUNTER — Ambulatory Visit (INDEPENDENT_AMBULATORY_CARE_PROVIDER_SITE_OTHER): Payer: 59 | Admitting: Vascular Surgery

## 2010-11-15 DIAGNOSIS — I739 Peripheral vascular disease, unspecified: Secondary | ICD-10-CM

## 2010-11-15 DIAGNOSIS — I70219 Atherosclerosis of native arteries of extremities with intermittent claudication, unspecified extremity: Secondary | ICD-10-CM

## 2010-11-15 DIAGNOSIS — Z48812 Encounter for surgical aftercare following surgery on the circulatory system: Secondary | ICD-10-CM

## 2010-11-16 NOTE — Miscellaneous (Signed)
Summary: Orders Update   Clinical Lists Changes  Problems: Added new problem of MASS, LUNG (ICD-786.6) Orders: Added new Referral order of Pulmonary Referral (Pulmonary) - Signed 

## 2010-11-16 NOTE — Progress Notes (Signed)
Summary: New Rx  Phone Note From Pharmacy   Caller: [facility]* Summary of Call: On Refill Request for Beconase AQ 0.042% spray...  Handwritten note: Patient reqiest new rx fro Qvan 40mg . Patient was given samples. Need new rx and sign. Thanks.  Initial call taken by: Harold Barban,  November 06, 2010 8:58 AM    New/Updated Medications: QVAR 40 MCG/ACT AERS (BECLOMETHASONE DIPROPIONATE) 1-2 puffs ever 12 hours, gargle and spit after use Prescriptions: QVAR 40 MCG/ACT AERS (BECLOMETHASONE DIPROPIONATE) 1-2 puffs ever 12 hours, gargle and spit after use  #1 x 4   Entered by:   Shonna Chock CMA   Authorized by:   Marga Melnick MD   Signed by:   Shonna Chock CMA on 11/06/2010   Method used:   Telephoned to ...       [facility]* (retail)       [address]       [place], [place]  [address]       Ph: [phone]       Fax: [phone]   RxID:   [id number]

## 2010-11-17 NOTE — Assessment & Plan Note (Addendum)
OFFICE VISIT  Troy [name], Troy [name] DOB:  1941/05/22                                       11/15/2010 EAVWU#:[id number]  I saw Troy [name] in the office today for continued follow-up of his claudication.  I have followed this patient for some time with stable right lower extremity claudication.  He continues to have claudication in the right calf which is brought on by ambulation and relieved with rest.  His symptoms have remained stable over the last year.  He has had no rest pain and no history of nonhealing wounds.  He has had no symptoms on the left side.  He has had no thigh or hip claudication.  PAST MEDICAL HISTORY:  Past medical history is fairly unremarkable.  He denies any history of diabetes, hypertension, hypercholesterolemia, history of previous myocardial infarction, history congestive heart failure or history of COPD.  SOCIAL HISTORY:  He continues to smoke 1 pack per day of cigarettes.  REVIEW OF SYSTEMS:  CARDIOVASCULAR:  He has had no chest pain, chest pressure, palpitations or arrhythmias. PULMONARY:  He has had no productive cough, bronchitis, asthma or wheezing.  PHYSICAL EXAMINATION:  This is a pleasant 70 year old gentleman who appears his stated age. Blood pressure is 132/71, heart rate is 90, temperature 97.9. LUNGS:  Clear bilaterally to auscultation without rales, rhonchi or wheezing. CARDIOVASCULAR:  I do not detect any carotid bruits.  He has a regular rate and rhythm.  He has palpable femoral pulses and a palpable left popliteal and dorsalis pedis pulse.  I cannot palpate pulses on the right side.  Right foot is warm well-perfused.  No significant lower extremity swelling. NEUROLOGIC:  He has no focal weakness or paresthesias.  I did independently interpret his arterial Doppler study today which shows monophasic Doppler signals in the right foot but with an ABI of 62% which is not changed.  On the left side, he has biphasic  Doppler signals in the dorsalis pedis and posterior tibial positions with an ABI of 95%.  We have again discussed that the importance of tobacco cessation and I have encouraged him to stay as active as possible.  He will have follow- up ABIs of 1 year and I will plan on seeing him back in 2 years unless there has been any significant change in his ABIs.    Di Kindle. Edilia Bo, M.D. Electronically Signed  CSD/MEDQ  D:  11/15/2010  T:  11/16/2010  Job:  [id number]

## 2010-11-23 ENCOUNTER — Institutional Professional Consult (permissible substitution) (INDEPENDENT_AMBULATORY_CARE_PROVIDER_SITE_OTHER): Payer: Managed Care, Other (non HMO) | Admitting: Internal Medicine

## 2010-11-23 ENCOUNTER — Encounter: Payer: Self-pay | Admitting: Internal Medicine

## 2010-11-23 DIAGNOSIS — R222 Localized swelling, mass and lump, trunk: Secondary | ICD-10-CM

## 2010-11-24 ENCOUNTER — Institutional Professional Consult (permissible substitution): Payer: PRIVATE HEALTH INSURANCE | Admitting: Internal Medicine

## 2010-11-28 NOTE — Assessment & Plan Note (Signed)
Summary: Pulmonary/ new pt eval > scheduled for bronchosopy for 2/29   Visit Type:  Initial Consult Primary Provider/Referring Provider:  Dr. Alwyn Ren   CC:  Cough/lung mass.  History of Present Illness: 85 yowm smoker with recurrent bronchitis "every winter"  referred by Dr Alwyn Ren for new R hilar abn on cxr/ ct   November 23, 2010  1st pulmonary office eval cc typical bronchitis with cough with clear mucus better to his satisfaction (but not wife's) p rx with z max / mucinex.  never really had purulent sputum or sob. Pt denies any significant sore throat, dysphagia, itching, sneezing,  nasal congestion or excess secretions,  fever, chills, sweats, unintended wt loss, pleuritic or exertional cp, hempoptysis, change in activity tolerance  orthopnea pnd or leg swelling Pt also denies any obvious fluctuation in symptoms with weather or environmental change or other alleviating or aggravating factors x cold weather seems to make it worse.   Current Medications (verified): 1)  Plavix 75 Mg  Tabs (Clopidogrel Bisulfate) .... Take 1 Tab Each Day 2)  Cardura 4 Mg Tabs (Doxazosin Mesylate) .... 1/2 Tab Once Daily 3)  Crestor 20 Mg Tabs (Rosuvastatin Calcium) .[name].. 1 By Mouth Once Daily 4)  Cilostazol 100 Mg  Tabs (Cilostazol) .... Take One Tablet Twice Daily For Circulation. 5)  Lorazepam 0.5 Mg Tabs (Lorazepam) .[name].. 1 Every 8-12 Hrs For "yips" 6)  Nasonex 50 Mcg/act Susp (Mometasone Furoate) .[name].. 1 Spray Once Daily - Two Times A Day As Needed After Neti Pot 7)  Lumigan 0.03 % Soln (Bimatoprost) .[name].. 1 Drop in Both Eyes At Bedtime 8)  Cosopt 22.3-6.8 Mg/ml Soln (Dorzolamide Hcl-Timolol Mal) .[name].. 1 Drop in Right Eye Two Times A Day 9)  Alphagan P 0.15 % Soln (Brimonidine Tartrate) .[name].. 1 Drop in Right Eye Three Times A Day, 1 Drop in Left Eye Two Times A Day 10)  Benzonatate 200 Mg Caps (Benzonatate) .[name].. 1 Every 6 -8 Hrs As Needed For Cough 11)  Qvar 40 Mcg/act Aers (Beclomethasone Dipropionate) .[name].. 1-2  Puffs Ever 12 Hours, Gargle and [place] After Use 12)  Beconase Aq 42 Mcg/spray Susp (Beclomethasone Diprop Monohyd) .[name].. 1-2 Sprays in Each Nostril Two Times A Day  Allergies (verified): 1)  ! Sulfa 2)  ! Pcn 3)  ! * Advicor 4)  ! 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) ,Dr Ezzard Standing, ENT PERIPHERAL VASCULAR DISEASE (ICD-443.9) GLAUCOMA NOS (ICD-365.9) Dr Hazle Quant, Ophth Colonic polyps, hx of R Hilar Fullness on cxr / ct since 10/31/2010........................Marland KitchenWert     - FOB rec November 29, 2010 >>>       Family History: father alcoholic, MI @ 51, cva paternal family history MI: 2 P uncles @ 63 &52 mother mini cva neg lung ca  Social History: Current Smoker since age 67.  Smokes 1 ppd. 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  The patient denies shortness of breath at rest, coughing up blood, chest pain, irregular heartbeats, acid heartburn, indigestion, loss of appetite, weight change, abdominal pain, difficulty swallowing, sore throat, tooth/dental problems, headaches, nasal congestion/difficulty breathing through nose, sneezing, itching, ear ache, depression, change in color of mucus, and fever.    Vital Signs:  Patient profile:  70 year old male Height:      73 inches Weight:      202.50 pounds BMI:     26.81 O2 Sat:      94 % on Room air Temp:     97.7 degrees F oral Pulse rate:   87 / minute BP sitting:   134 / 78  (left arm)  Vitals Entered By: Vernie Murders (November 23, 2010 3:23 PM)  O2 Flow:  Room air  Physical Exam  Additional Exam:  amb tense wm nad wt 200 > 202 November 23, 2010  HEENT: nl  dentition, turbinates, and orophanx. Nl external ear canals without cough reflex NECK :  without JVD/Nodes/TM/ nl carotid upstrokes bilaterally LUNGS: no acc muscle use, clear to A and P bilaterally without cough on insp or exp maneuvers CV:  RRR  no s3 or murmur or increase in P2, no edema  ABD:  soft and nontender with nl excursion in the supine position. No bruits or organomegaly, bowel sounds nl MS:  warm without deformities, calf tenderness, cyanosis or clubbing SKIN: warm and dry without lesions   NEURO:  alert, approp, no deficits     Impression & Recommendations:  Problem # 1:  MASS, LUNG (ICD-786.6)  cxr from 10/31/2010 def new area at R hilum but on CT scan is described as a spiculated mass that actually appears to have air bronchograms within in making it appear more spiculated than it is.  His cough is gone to his satisfaction, he never had hemoptysis and has a nl exam now but is a longstanding smoker so I think it's reasonable to proceed with fob but this will need to done off plavix.  Discussed in detail all the  indications, usual  risks and alternatives  relative to the benefits with patient who agrees to proceed with bronchoscopy with biopsy.   Explained PET scan doesn't help in ddx between a possible bronchopna w/in past 6 weeks vs maligancy and therefore would not do one at this point.   Other Orders: Consultation Level V [phone])  Patient Instructions: 1)  Stop plavix 2)  Return on Wednesday am 730 @ outpt registration ( opposite the ER ) with nothing to eat or drink after midnight Tuesday for Bronchoscopy 3)  you will need a follow up cxr in 3 weeks

## 2010-11-29 ENCOUNTER — Other Ambulatory Visit: Payer: Self-pay | Admitting: Internal Medicine

## 2010-11-29 ENCOUNTER — Ambulatory Visit (HOSPITAL_COMMUNITY)
Admission: RE | Admit: 2010-11-29 | Discharge: 2010-11-29 | Disposition: A | Discharge status: Home / Self Care | Payer: Managed Care, Other (non HMO) | Source: Ambulatory Visit | Attending: Internal Medicine | Admitting: Internal Medicine

## 2010-11-29 DIAGNOSIS — R918 Other nonspecific abnormal finding of lung field: Secondary | ICD-10-CM | POA: Insufficient documentation

## 2010-11-29 DIAGNOSIS — R222 Localized swelling, mass and lump, trunk: Secondary | ICD-10-CM

## 2010-12-02 NOTE — Op Note (Signed)
  NAME:  Troy [name], Troy [name] NO.:  [id number]  MEDICAL RECORD NO.:  [id number]           PATIENT TYPE:  O  LOCATION:  RESP                         FACILITY:  [facility]  PHYSICIAN:  Casimiro Needle B. Sherene Sires, MD, FCCPDATE OF BIRTH:  January 09, 1941  DATE OF PROCEDURE: DATE OF DISCHARGE:                              OPERATIVE REPORT   PROCEDURE:  Video fiberoptic bronchoscopy with biopsy of the right lower lobe superior segment orifice and washings of the same.  HISTORY AND INDICATIONS:  Please see attached office records.  This patient was seen within the last week for evaluation of an abnormal right hilar density seen on both PA and lateral chest x-ray and on CT scan suggesting the possibility of neoplasm.  However, this occurred in the setting of an apparent acute bronchopneumonia already treated effectively with antibiotics.  The patient is feeling better at the time of procedure with no active sputum production, no history of hemoptysis. There has been no change otherwise in his history or exam since the office evaluation attached.  DESCRIPTION OF PROCEDURE:  A formal time-out procedure was carried out. Then the patient was given a total of 4 mg of IV Versed and 50 mg IV Demerol for added sedation, cough suppression.  Continuously monitored by surface ECG and oximetry.  Using a standard flexible video fiberoptic scope, the right naris was easily cannulated with good visualization of the entire pharynx and larynx.  The cords moved normally and all apparent upper airway lesions.  Using additional 1% lidocaine, the entire tracheobronchial tree was explored by the following findings: 1. All the airways opened widely to subsegmental level. 2. There was no evidence of any abnormality within the right lower     lobe superior segment, neither at the orifice nor at the divisions,     nor was there any evidence of an extrinsic mass effect.  Initially the superior segment was lavaged  vigorously #2 to be complete, 2 endobronchial biopsies were obtained within the orifice in the area of the densities seen on chest x-ray but these were felt to be very low yield.  IMPRESSION:  No evidence of endobronchial neoplasm.  RECOMMENDATION:  Followup carefully with chest x-ray in 2 weeks.  If there is still an obvious density present in the right hilum, then the best step would be a PET scan followed by either an EBUS biopsy or consideration for VATS excisional biopsy and lobectomy if proves to be tumor.  The patient tolerated the procedure well.  Initial results were discussed with his wife including the need for him to be n.p.o. for 2 more hours and that he might have traces of hemoptysis as a result of the biopsies.  Plavix can be restarted tomorrow.     Charlaine Dalton. Sherene Sires, MD, [facility]     MBW/MEDQ  D:  11/29/2010  T:  11/29/2010  Job:  [id number]  cc:   Titus Dubin. Alwyn Ren, MD,FACP,FCCP [phone] [place] [address]  Electronically Signed by Sandrea Hughs MD FCCP on 12/02/2010 01:10:16 PM

## 2010-12-05 ENCOUNTER — Telehealth (INDEPENDENT_AMBULATORY_CARE_PROVIDER_SITE_OTHER): Payer: Self-pay | Admitting: *Deleted

## 2010-12-12 NOTE — Progress Notes (Signed)
  Phone Note Other Incoming   Request: Send information Summary of Call: Faxed patient's Bronch and Path report to [phone].

## 2010-12-14 ENCOUNTER — Ambulatory Visit (INDEPENDENT_AMBULATORY_CARE_PROVIDER_SITE_OTHER)
Admission: RE | Admit: 2010-12-14 | Discharge: 2010-12-14 | Disposition: A | Discharge status: Home / Self Care | Payer: Managed Care, Other (non HMO) | Source: Ambulatory Visit | Attending: Internal Medicine | Admitting: Internal Medicine

## 2010-12-14 ENCOUNTER — Ambulatory Visit (INDEPENDENT_AMBULATORY_CARE_PROVIDER_SITE_OTHER): Payer: Managed Care, Other (non HMO) | Admitting: Internal Medicine

## 2010-12-14 ENCOUNTER — Encounter: Payer: Self-pay | Admitting: Internal Medicine

## 2010-12-14 ENCOUNTER — Other Ambulatory Visit: Payer: Self-pay | Admitting: Internal Medicine

## 2010-12-14 DIAGNOSIS — R222 Localized swelling, mass and lump, trunk: Secondary | ICD-10-CM

## 2010-12-14 DIAGNOSIS — F17200 Nicotine dependence, unspecified, uncomplicated: Secondary | ICD-10-CM

## 2010-12-14 DIAGNOSIS — R911 Solitary pulmonary nodule: Secondary | ICD-10-CM

## 2010-12-19 NOTE — Assessment & Plan Note (Signed)
Summary: Pulmonary/ ext summary f/u ov > PET next   Visit Type:  Follow-up Primary Provider/Referring Provider:  Dr. Alwyn Ren   CC:  Bronchoscopy follow-up w/ CXR.  No longer has cough..  History of Present Illness: 39 yowm smoker with recurrent bronchitis "every winter"  referred by Dr Alwyn Ren for new R hilar abn on cxr/ ct   November 23, 2010  1st pulmonary office eval cc typical bronchitis acute onset x 3 weeks  with cough with clear mucus better to his satisfaction (but not wife's) p rx with z max / mucinex.  never really had purulent sputum or sob.  imp was bronchopna vs tumor  LLL sup segment.  rec fob  Nov 29, 2010  FOB completely nl airways, bx and washings also neg. rec f/u cxr 6 weeks from onset of acute symptoms.  December 14, 2010 ov for 6 week f/u cxr:  cough gone as long as uses prilosec and pepcid plus mucinex as needed.  still smoking and reluctant to quit. no sob.  Pt denies any significant sore throat, dysphagia, itching, sneezing,  nasal congestion or excess secretions,  fever, chills, sweats, unintended wt loss, pleuritic or exertional cp, hempoptysis, change in activity tolerance  orthopnea pnd or leg swelling   Preventive Screening-Counseling & Management  Alcohol-Tobacco     Smoking Status: current  Current Medications (verified): 1)  Plavix 75 Mg  Tabs (Clopidogrel Bisulfate) .... Take 1 Tab Each Day 2)  Cardura 4 Mg Tabs (Doxazosin Mesylate) .... 1/2 Tab Once Daily 3)  Crestor 20 Mg Tabs (Rosuvastatin Calcium) .[name].. 1 By Mouth Once Daily 4)  Cilostazol 100 Mg  Tabs (Cilostazol) .... Take One Tablet Twice Daily For Circulation. 5)  Lorazepam 0.5 Mg Tabs (Lorazepam) .[name].. 1 Every 8-12 Hrs For "yips" 6)  Lumigan 0.03 % Soln (Bimatoprost) .[name].. 1 Drop in Both Eyes At Bedtime 7)  Cosopt 22.3-6.8 Mg/ml Soln (Dorzolamide Hcl-Timolol Mal) .[name].. 1 Drop in Right Eye Two Times A Day 8)  Alphagan P 0.15 % Soln (Brimonidine Tartrate) .[name].. 1 Drop in Right Eye Three Times A Day, 1 Drop  in Left Eye Two Times A Day 9)  Qvar 40 Mcg/act Aers (Beclomethasone Dipropionate) .[name].. 1-2 Puffs Ever 12 Hours, Gargle and [place] After Use--Not Using 10)  Beconase Aq 42 Mcg/spray Susp (Beclomethasone Diprop Monohyd) .[name].. 1-2 Sprays in Each Nostril Two Times A Day 11)  Prilosec 20 Mg Cpdr (Omeprazole) .[name].. 1 By Mouth Every Morning 12)  Pepcid Ac 10 Mg Tabs (Famotidine) .[name].. 1 By Mouth Every Night  Allergies (verified): 1)  ! Sulfa 2)  ! Pcn 3)  ! * Advicor 4)  ! 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) ,Dr Ezzard Standing, ENT PERIPHERAL VASCULAR DISEASE (ICD-443.9) GLAUCOMA NOS (ICD-365.9) Dr Hazle Quant, Ophth Colonic polyps, hx of R Hilar Fullness on cxr / ct since 10/31/2010........................Marland KitchenWert     - FOB  November 29, 2010 >>>  nl airways, no atypical cells     - PET Scan 11/23/10 >>>       Vital Signs:  Patient profile:   70 year old male Height:      73 inches (185.42 cm) Weight:      201.13 pounds (91.42 kg) BMI:     26.63 O2 Sat:      96 % on Room air Temp:     97.6 degrees F (36.44 degrees C) oral Pulse rate:   89 / minute BP sitting:  122 / 80  (left arm) Cuff size:   regular  Vitals Entered By: Michel Bickers CMA (December 14, 2010 3:22 PM)  O2 Sat at Rest %:  96 O2 Flow:  Room air CC: Bronchoscopy follow-up w/ CXR.  No longer has cough. Comments Medications reviewed with patient. Michel Bickers [facility]  December 14, 2010 3:23 PM   Physical Exam  Additional Exam:  amb tense wm nad wt  202 November 23, 2010 > 201 December 15, 2010  HEENT: nl dentition, turbinates, and orophanx. Nl external ear canals without cough reflex NECK :  without JVD/Nodes/TM/ nl carotid upstrokes bilaterally LUNGS: no acc muscle use, clear to A and P bilaterally without cough on insp or exp maneuvers CV:  RRR  no s3 or murmur or increase in P2, no edema  ABD:  soft and nontender with nl excursion in the supine  position. No bruits or organomegaly, bowel sounds nl MS:  warm without deformities, calf tenderness, cyanosis or clubbing     CXR  Procedure date:  12/14/2010  Findings:      Comparison: Chest CT 11/07/2010.  Findings: The cardiac silhouette, mediastinal and hilar contours are stable. Persistent no infiltrates, edema or effusions.  The bony thorax is intact.  fullness in the right infrahilar region likely reflecting the lesions seen on CT scan  IMPRESSION: Persistent fullness in the right infrahilar region likely reflecting the lesions seen on the prior CT scan.   Impression & Recommendations:  Problem # 1:  MASS, LUNG (ICD-786.6)  Despite resolution of acute symptoms and completely nl fob,  the density seen clearly on cxr has not resolved at 6 weeks making bronchopna  less likely and raising the issue of resectable tumor.  The proximity to the R hilum may be problematic and require lobectomy and or pneumonectomy and needs to stop smoking now if wants to pursue this aggressively  I had an extended discussion with the patient and wife  today lasting 15 to 20 minutes of a 25 minute visit on the following issues:   Discussed in detail all the  indications, usual  risks and alternatives  relative to the benefits with patient who agrees to proceed with PET now and referral to [facility] at his requesnt.  Problem # 2:  SMOKER (ICD-305.1) His functional status appears good, his cough is gone and plans to get pft's at [facility] so the maint aspect of his care at this point is to commt to quit smoking, for at least 2 weeks preop if not indefinitely.     Medications Added to Medication List This Visit: 1)  Qvar 40 Mcg/act Aers (Beclomethasone dipropionate) .[name].. 1-2 puffs ever 12 hours, gargle and spit after use--not using 2)  Prilosec 20 Mg Cpdr (Omeprazole) .[name].. 1 by mouth every morning 3)  Pepcid Ac 10 Mg Tabs (Famotidine) .[name].. 1 by mouth every night  Other Orders: T-2 View CXR  (71020TC) Misc. Referral (Misc. Ref) Est. Patient Level IV (84132)  Patient Instructions: 1)  See Patient Care Coordinator before leaving for PET Scan 2)  Copy sent to: Dr Maisie Fus  [facility] 3)  You must stop smoking for any invasive procedure

## 2010-12-22 ENCOUNTER — Telehealth: Payer: Self-pay | Admitting: Internal Medicine

## 2010-12-22 ENCOUNTER — Encounter (HOSPITAL_COMMUNITY): Payer: Self-pay

## 2010-12-22 ENCOUNTER — Encounter (HOSPITAL_COMMUNITY)
Admission: RE | Admit: 2010-12-22 | Discharge: 2010-12-22 | Disposition: A | Discharge status: Home / Self Care | Payer: Managed Care, Other (non HMO) | Source: Ambulatory Visit | Attending: Internal Medicine | Admitting: Internal Medicine

## 2010-12-22 DIAGNOSIS — R911 Solitary pulmonary nodule: Secondary | ICD-10-CM

## 2010-12-22 DIAGNOSIS — J984 Other disorders of lung: Secondary | ICD-10-CM | POA: Insufficient documentation

## 2010-12-22 MED ORDER — FLUDEOXYGLUCOSE F - 18 (FDG) INJECTION
17.5000 | Freq: Once | INTRAVENOUS | Status: AC | PRN
  Administered 2010-12-22: 17.5 via INTRAVENOUS

## 2010-12-22 NOTE — Telephone Encounter (Signed)
Discussed with pt and sent copy to [facility]

## 2010-12-26 ENCOUNTER — Other Ambulatory Visit: Payer: Self-pay | Admitting: Internal Medicine

## 2011-01-24 HISTORY — PX: LUNG LOBECTOMY: SHX167

## 2011-02-08 ENCOUNTER — Ambulatory Visit: Payer: Managed Care, Other (non HMO) | Admitting: Internal Medicine

## 2011-02-13 NOTE — Letter (Signed)
Feb 22, 2009   To Whom It May Concern   Re:  GOTTFRIED, STANDISH                  DOB:  [birth year]   Mr. Riddik Senna is a patient of mine who I have been following with  peripheral vascular disease for many years.  He has had a long history  of claudication in the right lower extremity which prevents him from  walking any significant distance.  He has had a previous arteriogram  which demonstrated superficial femoral artery occlusive disease and  tibial occlusive disease on the right.   On his most recent Doppler study his ABI on the right was 59%.  He had  dampened monophasic signals in the right foot.   Ms. Schrom plans a trip to [place] and will be playing golf.  Because of  his disabling claudication in the right low lower extremity with  superficial femoral artery occlusive disease he would require a golf  cart in order to participate.  If I can provide any further information,  please let me know.   Sincerely,   Di Kindle. Edilia Bo, M.D.  Electronically Signed   CSD/MEDQ  D:  02/22/2009  T:  02/23/2009  Job:  [id number]

## 2011-02-13 NOTE — Procedures (Signed)
BYPASS GRAFT EVALUATION   INDICATION:  Follow up left leg bypass graft.   HISTORY:  Diabetes:  No.  Cardiac:  No.  Hypertension:  No.  Smoking:  One pack per day for 50+ years.  Previous Surgery:  Patient has had multiple left leg revascularizations,  the last was a femoral-to-popliteal artery bypass graft in [place].   SINGLE LEVEL ARTERIAL EXAM                               RIGHT              LEFT  Brachial:                    120                120  Anterior tibial:             70                 118  Posterior tibial:            74                 118  Peroneal:  Ankle/brachial index:        0.62               0.98   PREVIOUS ABI:  Date: 07/22/2007  RIGHT:  0.59  LEFT:  >1.0   LOWER EXTREMITY BYPASS GRAFT DUPLEX EXAM:   DUPLEX:  Doppler arterial waveforms are biphasic proximal to, throughout  and distal to the graft.  Velocities are within normal limits.   IMPRESSION:  1. Patent left femoral-to-popliteal artery bypass graft.  2. ABIs are stable bilaterally.      ___________________________________________  Di Kindle. Edilia Bo, M.D.   DP/MEDQ  D:  01/13/2008  T:  01/13/2008  Job:  [id number]

## 2011-02-16 NOTE — Discharge Summary (Signed)
NAME:  Troy [name], Troy [name] NO.:  [id number]   MEDICAL RECORD NO.:  [id number]                   PATIENT TYPE:  INP   LOCATION:  4739                                 FACILITY:  [facility]   PHYSICIAN:  Wanda Plump, MD [facility]                 DATE OF BIRTH:  Sep 19, 1941   DATE OF ADMISSION:  02/09/2003  DATE OF DISCHARGE:  02/10/2003                                 DISCHARGE SUMMARY   ADMITTING DIAGNOSIS:  Chest pain.   DISCHARGE DIAGNOSES:  1. Chest pain with a negative stress test.  2. High cholesterol.  3. Peripheral vascular disease.  4. Glaucoma.   BRIEF HISTORY AND PHYSICAL:  [name] is a 70 year old white male who  presented to the office complaining of an 8-week history of chest pain. The  pain is described as discomfort without radiation, located in the anterior  chest. His symptoms somehow decrease with Gaviscon.   PHYSICAL EXAM:  GENERAL: The patient is alert, oriented, in no apparent  distress.  VITAL SIGNS: His blood pressure was 118/52, with pulse of 76. Weight is 208  pounds.  LUNGS: Clear.  CARDIOVASCULAR:  Regular rate and rhythm without murmur.  ABDOMEN: Benign.   LABORATORY & X-RAYS::  During this admission, he had a chest x-ray which was  basically negative except for mild bronchitis changes.  CBC showed a white  count of 5.2. hemoglobin 13.8, platelets 196, sodium of 140, potassium 4.4,  chloride 108, glucose 96, BUN 11, creatinine 1.0.  PT and PTT were normal.  EKGs were normal and there was no change compared to previous EKGs.  All  cardiac enzymes were negative.   HOSPITAL COURSE:  [name] was admitted to the hospital with atypical chest  pain. The differential diagnoses included coronary artery disease given his  multiple risk factors, versus GI etiology. His hospital stay was  unremarkable and he was basically asymptomatic. His vital signs were normal  except for a blood pressure of 173/79 one time. He had a myocardial  perfusion SPECT imaging, which was reported negative. The stress test was  negative. At this point, I think he got maximum hospital benefit based on  the negative stress test. I now believe that his symptoms are rather GI in  origin. I am planning to discharge him home. He is to take the same  medications that he was taking prior to admission that include Cardura,  Lipitor 20 mg every day, Plavix 75 mg p.o. daily, Pletal 100 mg p.o. daily,  Omega-3 fatty acids, and his eyedrops for glaucoma. He will also take an  aspirin every day and  a PPI daily to see if that helps with his symptoms.  We will arrange an outpatient evaluation with Dr. Juanda Chance, who is our GI  specialist.  Wanda Plump, MD [facility]   JEP/MEDQ  D:  02/12/2003  T:  02/13/2003  Job:  [id number]   cc:   Lina Sar, M.D. [facility]

## 2011-02-16 NOTE — Op Note (Signed)
NAME:  Troy [name], Troy [name] NO.:  [id number]   MEDICAL RECORD NO.:  [id number]                   PATIENT TYPE:  OIB   LOCATION:  2860                                 FACILITY:  [facility]   PHYSICIAN:  Di Kindle. Edilia Bo, M.D.        DATE OF BIRTH:  1941-02-18   DATE OF PROCEDURE:  07/27/2002  DATE OF DISCHARGE:                                 OPERATIVE REPORT   PREOPERATIVE DIAGNOSES:  Right lower extremity claudication.   POSTOPERATIVE DIAGNOSES:  Right lower extremity claudication.   OPERATION PERFORMED:  1. Aortogram.  2. Bilateral iliac arteriogram.  3. Bilateral lower extremity runoff.   SURGEON:  Di Kindle. Edilia Bo, M.D.   ASSISTANT:  Nurse.   DESCRIPTION OF PROCEDURE:  The patient was taken to the peripheral vascular  cath lab at [facility] and sedated with 1 mg of Versed and 50 mcg of fentanyl.  He  subsequently received an additional 1 mg of Versed.  Both groins were  prepped and draped in the usual sterile fashion.  After the skin was  infiltrated with 1% lidocaine, the right common femoral artery was  cannulated and a guidewire introduced into the infrarenal aorta under  fluoroscopic control.  A 5 French sheath was passed over the wire.  The  dilator was removed.  The pigtail catheter was positioned at the L1  vertebral body and flush aortogram was obtained.  The catheter was then  repositioned above the aortic bifurcation and oblique iliac projections were  obtained.  Note, I also obtained an oblique projection of the left renal  artery which had a mild stenosis.  Next, the pigtail catheter was exchanged  for an IMA catheter which was positioned into the proximal left common iliac  artery.  An angled Glide wire was passed into the external iliac artery on  the left and then the IMA catheter was exchanged for an end hole catheter.  A left lower extremity runoff film was obtained.   Next, pullback pressures were obtained across the iliac  system on both  sides.  There was only a slight pressure gradient on the right side from the  common iliac to the distal external iliac artery.  This was approximately 5  mmHg mean gradient.  Next, right lower extremity runoff films were obtained  through the right femoral sheath.   FINDINGS:  There were single renal arteries bilaterally with a mild 20% left  renal artery stenosis identified.  The inferior mesenteric artery was  patent.  There was mild diffuse disease of both common iliac arteries.  The  right hypogastric artery was occluded.  The left hypogastric artery was  patent.  There was moderate atherosclerotic ulcerated plaque in the left  common iliac artery.   Of note there is a tight 90% stenosis in the proximal left hypogastric  artery.  On the right side, the right common femoral artery is patent.  The  superficial  femoral artery is occluded at its origin.  The deep femoral  artery is patent.  There is a femoral-popliteal bypass graft on the left  side which is anastomosed to the below-knee popliteal artery.  This graft  appears to extend down to the posterior tibial artery.  The posterior tibial  artery is patent to the ankle and the peroneal artery is patent although it  has a tight proximal stenosis.  In reviewing his old records, this patient  had previously had a fem below-knee pop bypass done.  The popliteal artery  had subsequently occluded and the patient had a revision of this graft with  vein jump down to the posterior tibial artery reportedly.  This was done in  [place].   On the right side, no focal stenosis was noted in the common, external,  iliac or common femoral artery.  The right common femoral artery was patent  as was the deep femoral artery.  The superficial femoral artery was patent  with only mild diffuse disease.  The popliteal artery was patent and there  was single vessel runoff through the peroneal artery on the left side.  There is some  reconstitution  distally of a diseased distal anterior tibial  artery.  The posterior tibial artery is occluded.  The anterior tibial was  also occluded proximally but only reconstitutes distally.   CONCLUSION:  1. Mild diffuse iliac artery occlusive disease bilaterally.  2. 20% left renal artery stenosis as described above.  3. Patent femoral-popliteal bypass graft on the left leg as described above.  4. Mild superficial femoral artery and popliteal artery occlusive disease     with severe kidney occlusive disease on the right as described above but     in line flow through the peroneal artery on the right.                                                  Di Kindle. Edilia Bo, M.D.    CSD/MEDQ  D:  07/27/2002  T:  07/27/2002  Job:  [id number]   cc:   Tama Headings. Marina Goodell, M.D.

## 2011-02-16 NOTE — Op Note (Signed)
NAME:  Troy [name], Troy [name] NO.:  [id number]   MEDICAL RECORD NO.:  [id number]          PATIENT TYPE:  AMB   LOCATION:  DSC                          FACILITY:  [facility]   PHYSICIAN:  Kristine Garbe. Ezzard Standing, M.D.DATE OF BIRTH:  [birth year]   DATE OF PROCEDURE:  06/27/2004  DATE OF DISCHARGE:                                 OPERATIVE REPORT   PREOPERATIVE DIAGNOSIS:  True vocal cord leukoplakia, history of smoking.   POSTOPERATIVE DIAGNOSIS:  True vocal cord leukoplakia, history of smoking.   OPERATION:  Microlaryngoscopy and biopsy of true vocal cord leukoplakia.   SURGEON:  Narda Bonds, M.D.   ANESTHESIA:  General endotracheal.   COMPLICATIONS:  None.   BRIEF CLINICAL NOTE:  Troy [name] is a 70 year old gentleman who has had a  raspy voice for several weeks now.  He smokes about a pack a day.  On  examination in the office he has leukoplakia involving the left true vocal  cord.  He is taken to the operating room at this time for microlaryngoscopy  and biopsy of vocal cord leukoplakia.   DESCRIPTION OF PROCEDURE:  After adequate endotracheal anesthesia, direct  laryngoscopy was performed.  The base of tongue, epiglottis were normal to  evaluation.  Both piriform sinuses were clear with normal mucosal membranes.  False cords were clear.  True cords were then evaluated.  The patient had  some leukoplakia involving predominantly the left true vocal cord, had some  slight thickening of the right true vocal cord but this was minimal.  Predominant area of leukoplakia was on the left side.  Photos were obtained  and then a biopsy was obtained from the left true vocal cord.  This  completed the procedure.  Troy [name] was subsequently awoke from anesthesia and  transferred to the recovery room postop doing well.   DISPOSITION:  Troy [name] is discharged home later this morning on Tylenol #3  p.r.n. pain, also placed him on Nexium 40 mg once daily for 4 weeks.  Will  have followup in my  office next week to review pathology.       CEN/MEDQ  D:  06/27/2004  T:  06/27/2004  Job:  [id number]   cc:   Wanda Plump, MD [facility]  [phone] [name], [place] [address]

## 2011-02-16 NOTE — H&P (Signed)
NAME:  Troy [name], Troy [name] NO.:  [id number]   MEDICAL RECORD NO.:  [id number]                   PATIENT TYPE:  INP   LOCATION:  4739                                 FACILITY:  [facility]   PHYSICIAN:  Wanda Plump, MD [facility]                 DATE OF BIRTH:  1940/10/05   DATE OF ADMISSION:  02/09/2003  DATE OF DISCHARGE:                                HISTORY & PHYSICAL   CHIEF COMPLAINT:  Chest pain.   HISTORY OF PRESENT ILLNESS:  The patient is a 70 year old male who presented  to the office with an eight-week history of chest pain.  The pain is  described as a discomfort at the anterior chest with no radiation and not  associated with any shortness of breath, nausea, or diaphoresis.  The  pattern is one of increased frequency.  In fact, he has been having daily  chest pain for the last three or four days.  He has noticed that the  symptoms somehow decrease with Gaviscon and increase by certain foods.  There is no clear association of the discomfort with exertion.   PAST MEDICAL HISTORY:  1. High cholesterol.  2. Peripheral vascular disease.  He had left femoropopliteal graft at the     [facility] in 1994.  Apparently this was revised a few years later.  3. Glaucoma.  4. He is taking Cardura.  It is unclear to me the reason why he is taking     it.   SOCIAL HISTORY:  The patient is a smoker.  He has several drinks every week.   FAMILY HISTORY:  His father had two MIs and eventually died from a stroke.  He started to have heart trouble when he was around 27-29 years old.  His  mother had a history of mini strokes.   REVIEW OF SYSTEMS:  He denies any fever or leg edema.  He has minimal  claudication, mostly at the right leg.  He denies any heartburn or  difficulty swallowing.   MEDICATIONS:  1. Cardura.  2. Lipitor 20 mg every day.  3. Plavix 75 mg p.o. daily.  4. Pletal 100 mg p.o. daily.  5. Omega 3 fatty acids.  6. Eyedrops for glaucoma.   ALLERGIES:  He is allergic to PENICILLIN.   PHYSICAL EXAMINATION:  GENERAL APPEARANCE:  The patient is alert and  oriented and in no apparent distress.  VITAL SIGNS:  His blood pressure is 118/52 with a pulse of 76.  WEIGHT:  208 pounds.  LUNGS:  Basically clear to auscultation bilaterally.  CARDIOVASCULAR:  Regular rate and rhythm without a murmur.  ABDOMEN:  Not distended.  Soft with good bowel sounds.  No  hepatosplenomegaly.  EXTREMITIES:  Trace edema bilaterally.  He has slightly decreased, but  positive pedal pulses.  NEUROLOGIC:  Motor and speech are normal.  NECK:  Palpation of  the carotid arteries is normal.   LABORATORY DATA:  The EKG shows normal sinus rhythm and compared to the old  EKG in the chart there have been no changes.   ASSESSMENT AND PLAN:  Atypical chest pain in a patient with multiple risk  factors for coronary artery disease.  I explained to the patient that in my  opinion he needs to be admitted to the hospital and have a Cardiolite in the  morning.  If the Cardiolite is negative, he can be discharged home.  I am  planning to do a GI work-up as an outpatient.  If the Cardiolite is  positive, cardiology needs to be consulted.  The patient reluctantly accepts  my advice and he will be admitted to the hospital.  He did not like to have  an ambulance to transfer him to the hospital.  I am also planning to rule  him out by serial enzymes and EKGs.  Should he become unstable or having  frequent chest pain, will have to start IV heparin and IV beta blockers.                                               Wanda Plump, MD [facility]    JEP/MEDQ  D:  02/09/2003  T:  02/10/2003  Job:  [id number]

## 2011-02-20 ENCOUNTER — Encounter: Payer: Self-pay | Admitting: Internal Medicine

## 2011-02-28 ENCOUNTER — Encounter (HOSPITAL_COMMUNITY): Payer: 59 | Attending: Oncology | Admitting: Oncology

## 2011-02-28 DIAGNOSIS — C3490 Malignant neoplasm of unspecified part of unspecified bronchus or lung: Secondary | ICD-10-CM

## 2011-04-19 ENCOUNTER — Other Ambulatory Visit: Payer: Self-pay | Admitting: Internal Medicine

## 2011-04-19 MED ORDER — ROSUVASTATIN CALCIUM 20 MG PO TABS: 20.0000 mg | ORAL_TABLET | Freq: Every day | ORAL | Status: DC

## 2011-04-19 NOTE — Telephone Encounter (Signed)
Patient can schedule Lipid/Hep 272.4/995.20 or a full CPX and have full lab panel at the same time

## 2011-04-23 ENCOUNTER — Other Ambulatory Visit: Payer: Self-pay | Admitting: Internal Medicine

## 2011-04-30 ENCOUNTER — Encounter (HOSPITAL_COMMUNITY): Payer: Self-pay | Admitting: Oncology

## 2011-04-30 ENCOUNTER — Other Ambulatory Visit (HOSPITAL_COMMUNITY): Payer: Self-pay | Admitting: Oncology

## 2011-04-30 DIAGNOSIS — C3490 Malignant neoplasm of unspecified part of unspecified bronchus or lung: Secondary | ICD-10-CM

## 2011-06-08 ENCOUNTER — Other Ambulatory Visit: Payer: Self-pay | Admitting: Internal Medicine

## 2011-06-08 NOTE — Telephone Encounter (Signed)
Patient needs to schedule appointment for labs Lipid/Hep 272.4/995.20

## 2011-07-23 ENCOUNTER — Other Ambulatory Visit (HOSPITAL_COMMUNITY): Payer: 59

## 2011-07-24 ENCOUNTER — Ambulatory Visit (HOSPITAL_COMMUNITY): Payer: 59 | Admitting: Oncology

## 2011-07-26 ENCOUNTER — Other Ambulatory Visit: Payer: Self-pay | Admitting: Internal Medicine

## 2011-08-01 ENCOUNTER — Encounter (HOSPITAL_COMMUNITY): Payer: 59 | Attending: Oncology | Admitting: Oncology

## 2011-08-01 VITALS — BP 123/69 | HR 80 | Temp 97.6°F | Ht 73.0 in | Wt 201.0 lb

## 2011-08-01 DIAGNOSIS — C3490 Malignant neoplasm of unspecified part of unspecified bronchus or lung: Secondary | ICD-10-CM

## 2011-08-01 DIAGNOSIS — Z09 Encounter for follow-up examination after completed treatment for conditions other than malignant neoplasm: Secondary | ICD-10-CM | POA: Insufficient documentation

## 2011-08-01 DIAGNOSIS — C3430 Malignant neoplasm of lower lobe, unspecified bronchus or lung: Secondary | ICD-10-CM

## 2011-08-01 DIAGNOSIS — Z85118 Personal history of other malignant neoplasm of bronchus and lung: Secondary | ICD-10-CM | POA: Insufficient documentation

## 2011-08-01 DIAGNOSIS — F17200 Nicotine dependence, unspecified, uncomplicated: Secondary | ICD-10-CM | POA: Insufficient documentation

## 2011-08-01 LAB — CBC
HCT: 42 % (ref 39.0–52.0)
Hemoglobin: 14 g/dL (ref 13.0–17.0)
MCH: 29.5 pg (ref 26.0–34.0)
RBC: 4.75 MIL/uL (ref 4.22–5.81)

## 2011-08-01 LAB — COMPREHENSIVE METABOLIC PANEL
ALT: 11 U/L (ref 0–53)
Alkaline Phosphatase: 85 U/L (ref 39–117)
BUN: 15 mg/dL (ref 6–23)
CO2: 27 mEq/L (ref 19–32)
Calcium: 9.5 mg/dL (ref 8.4–10.5)
GFR calc Af Amer: 73 mL/min — ABNORMAL LOW (ref >90)
GFR calc non Af Amer: 63 mL/min — ABNORMAL LOW (ref >90)
Glucose, Bld: 91 mg/dL (ref 70–99)
Sodium: 137 mEq/L (ref 135–145)

## 2011-08-01 NOTE — Patient Instructions (Signed)
[facility]  Discharge Instructions  RECOMMENDATIONS MADE BY THE CONSULTANT AND ANY TEST RESULTS WILL BE SENT TO YOUR REFERRING DOCTOR.   EXAM FINDINGS BY MD TODAY AND SIGNS AND SYMPTOMS TO REPORT TO CLINIC OR PRIMARY MD: Exam per Dr. Mariel Sleet    SPECIAL INSTRUCTIONS/FOLLOW-UP: Other (Referral/Appointments)  Labs today and appt in May 2013   I acknowledge that I have been informed and understand all the instructions given to me and received a copy. I do not have any more questions at this time, but understand that I may call the [facility] at [facility] at [phone] during business hours should I have any further questions or need assistance in obtaining follow-up care.    __________________________________________  _____________  __________ Signature of Patient or Authorized Representative            Date                   Time    __________________________________________ Nurse's Signature

## 2011-08-01 NOTE — Progress Notes (Signed)
Troy [name] presented for labwork. Labs per MD order drawn via Peripheral Line 25 gauge needle inserted in rt arm.  Good blood return present. Procedure without incident.  Needle removed intact. Patient tolerated procedure well.

## 2011-08-02 NOTE — Progress Notes (Signed)
CC:   Charlaine Dalton. Sherene Sires, MD, Tonny Bollman Titus Dubin. Alwyn Ren, MD,FACP,FCCP Denyse Dago, MD  DIAGNOSES: 1. Stage I (T2, N0) right lower lobe poorly differentiated squamous     cell carcinoma of the lung with LVI 3.7 cm in size x 2.6 x 2.1 cm.     All nodes were negative, felt to be a grade 3.  He had preoperative     staging and then pathologically was felt to be pT2a pN0 pMX tumor.     But again, no disease was found outside the lung on PET scanning,     etc.  Recent followup CT scan he states within the last 6 weeks is     negative. 2. Peripheral vascular disease status post 2 vascular procedures in     the past. 3. History of smoking a pack cigarettes a day for many years.  Still     smoking about 1/4- to 1/2-pack of cigarettes. 4. Social use of alcohol. 5. Sulfonamide and penicillin allergies. Troy [name] is here today for followup.  He did see Dr. Ewing Schlein and associates sometime in the last 6 weeks.  A CT scan was done and it was said to be totally negative, he states.  They encouraged him tremendously to quit smoking and he also met with one of their smoking cessation people.  He quit for a couple weeks and now he is back smoking 5-10 cigarettes a day.  He would really like to quit so we went over again when he needs to do, what triggers his reaching for the cigarettes, availability of cigarettes, trying to substitute something for that such as a carrot stick or celery stick, or a straw, or something that gives him this hand- mouth habit some rest.  Other than that, bowels are working well.  Appetite is excellent.  His vital signs today are excellent.  Weight is 201 pounds which is actually up about 6 pounds compared to May.  He is 6 feet 1 inch tall.  BMI is 26.  Blood pressure 123/69, pulse 80 and regular.  Respirations 16-18 and unlabored.  He is afebrile.  He denies any pain.  He is in no acute distress.  Lungs: Mild but definite hyper-resonance to percussion. Otherwise, they are  perfectly clear.  He has very intact, well-healed scars on the right chest.  Lungs:  No rales, rhonchi, or wheezes, or rubs.  Heart:  Regular rhythm and rate without murmur, rub, or gallop. Lymphatic:  He has no lymphadenopathy in the cervical, supraclavicular, infraclavicular, axillary, or inguinal areas.  Abdomen:  He has no hepatosplenomegaly.  Bowel sounds are normal.  Extremities:  He has no peripheral leg edema.  No arm edema.  Neurologic:  He is alert and very oriented.  We will get a CBC and CMET, see him back in May since she will be seen at [facility] in April, and I suspect he will have a CT of the chest that visit when he sees Dr. Ewing Schlein.  If he does not, we will be happy to do it here.  I have encouraged him again to quit smoking.  I think it is imperative for him from just the COPD standpoint.    ______________________________ Ladona Horns. Mariel Sleet, MD ESN/MEDQ  D:  08/01/2011  T:  08/02/2011  Job:  [id number]

## 2011-09-24 ENCOUNTER — Encounter: Payer: Self-pay | Admitting: Internal Medicine

## 2011-09-27 ENCOUNTER — Ambulatory Visit (INDEPENDENT_AMBULATORY_CARE_PROVIDER_SITE_OTHER): Payer: 59 | Admitting: Internal Medicine

## 2011-09-27 ENCOUNTER — Encounter: Payer: Self-pay | Admitting: Internal Medicine

## 2011-09-27 DIAGNOSIS — I714 Abdominal aortic aneurysm, without rupture, unspecified: Secondary | ICD-10-CM

## 2011-09-27 DIAGNOSIS — J209 Acute bronchitis, unspecified: Secondary | ICD-10-CM

## 2011-09-27 MED ORDER — AZITHROMYCIN 250 MG PO TABS: ORAL_TABLET | ORAL | Status: AC

## 2011-09-27 NOTE — Progress Notes (Signed)
  Subjective:    Patient ID: Troy [name], male    DOB: 1940-11-24, 70 y.o.   MRN: [id number]  HPI Respiratory tract infection Onset/symptoms:several weeks as essentially  NP cough Exposures (illness/environmental/extrinsic):wife ill Progression of symptoms:to clear sputum Treatments/response:Mucinex , Claritin with benefit Present symptoms: Fever/chills/sweats:no Frontal headache:no Facial pain:no Nasal purulence:no Sore throat:no Dental pain:no Lymphadenopathy:no Wheezing/shortness of breath:yes Cough/sputum/hemoptysis:scant sputum Pleuritic pain:no but SS pain with cough Associated extrinsic/allergic symptoms:itchy eyes/ sneezing:some sneezing Smoking history:1/2ppd           Review of Systems an incidental finding on a CAT scan ordered by his urologist was a 3 cm aortic aneurysm.     Objective:   Physical Exam General appearance :adequate  nourishment; no acute distress or increased work of breathing is present.  No  lymphadenopathy about the head, neck, or axilla noted.   Eyes: No conjunctival inflammation or lid edema is present.   Ears:  External ear exam shows no significant lesions or deformities.  Otoscopic examination reveals clear canals, tympanic membranes are intact bilaterally without bulging, retraction, inflammation or discharge.  Nose:  External nasal examination shows no deformity or inflammation. Nasal mucosa are dry without lesions or exudates. No septal dislocation .No obstruction to airflow.   Oral exam: Dental hygiene is good; lips and gums are healthy appearing.There is no oropharyngeal erythema or exudate noted. Slightly hoarse   Heart:  Normal rate and regular rhythm. S1 and S2 normal without gallop, murmur, click, rub or other extra sounds. Heart sounds slightly distant  Lungs:Chest clear to auscultation; no significant wheezes, rhonchi,rales ,or rubs present.No increased work of breathing.  Breath sounds decreased  Abdomen: No  organomegaly, masses, or aneurysm palpable  Extremities:  No cyanosis  or clubbing  noted    Skin: Warm & dry           Assessment & Plan:   #1 bronchitis, acute. Antibiotics  will be ordered.  #2 three cm aortic aneurysm. Focus will be on blood pressure control & smoking absinence

## 2011-09-27 NOTE — Patient Instructions (Signed)
Blood Pressure Goal  Ideally is an AVERAGE < 135/85. This AVERAGE should be calculated from @ least 5-7 BP readings taken @ different times of day on different days of week. You should not respond to isolated BP readings , but rather the AVERAGE for that week Consider  [facility]'s smoking cessation program @ www..com or 512-799-4259.   Please think about quitting smoking. Review the risks we discussed. Please call 1-800-QUIT-NOW  for free smoking cessation counseling.

## 2011-10-23 ENCOUNTER — Encounter: Payer: Self-pay | Admitting: Vascular Surgery

## 2011-10-24 ENCOUNTER — Ambulatory Visit (INDEPENDENT_AMBULATORY_CARE_PROVIDER_SITE_OTHER): Payer: PRIVATE HEALTH INSURANCE | Admitting: Vascular Surgery

## 2011-10-24 ENCOUNTER — Encounter: Payer: Self-pay | Admitting: Vascular Surgery

## 2011-10-24 VITALS — BP 171/82 | HR 80 | Resp 16 | Ht 73.0 in | Wt 196.0 lb

## 2011-10-24 DIAGNOSIS — I70219 Atherosclerosis of native arteries of extremities with intermittent claudication, unspecified extremity: Secondary | ICD-10-CM

## 2011-10-24 DIAGNOSIS — I714 Abdominal aortic aneurysm, without rupture, unspecified: Secondary | ICD-10-CM

## 2011-10-24 DIAGNOSIS — I739 Peripheral vascular disease, unspecified: Secondary | ICD-10-CM

## 2011-10-24 NOTE — Assessment & Plan Note (Signed)
This patient has known infrainguinal arterial occlusive disease. His most recent Doppler study in February of 2012 showed an ABI of 62% on the right and 95% on the left. He continues to have palpable pulses on the left foot with a warm well-perfused right foot. We have again discussed the importance of tobacco cessation. I've encouraged him to stay as active as possible. A scheduled follow up ABIs in one year now see him back at that time. He knows to call sooner if he has problems.

## 2011-10-24 NOTE — Assessment & Plan Note (Signed)
This patient had an incidental finding of a 3 cm infrarenal abdominal aortic aneurysm done on a CT scan by [facility]. He understands we would not consider elective repair unless the aneurysm enlarged to 5.5 cm. We have discussed the importance of blood pressure control and tobacco cessation. I have offered to refer him to [place] tobacco cessation program every states that he is working with his doctor in the dura him on this. I have ordered a follow up ultrasound in one year and we will continue to follow this aneurysm. Given the small size of the aneurysm I think one year follow up is reasonable. If the aneurysm shows evidence of enlargement and we can follow him at 6 month intervals.

## 2011-10-24 NOTE — Progress Notes (Signed)
[facility]  Patient name: Troy [name] MRN: [id number] DOB: [birth year] Sex: male  REASON FOR VISIT: small 3.0 cm infrarenal abdominal aortic aneurysm. Referred by Dr. Marga Melnick.  HPI: Troy [name] is a 71 y.o. male who is undergoing a workup for hematuria found on a UA. [facility] An incidental finding was a 3.0 cm infrarenal abdominal aortic aneurysm. Of note there were no findings to explain his microscopic hematuria. The patient denies any history of abdominal pain or back pain. I been following him with peripheral vascular disease which is been stable. He has stable claudication of the right leg. His symptoms are brought on by ambulation and relieved with rest. He's had no rest pain or nonhealing ulcers. His symptoms have been stable over the last year.  Past Medical History  Diagnosis Date  . Hypertension   . Squamous cell lung cancer 11/08/2010  . Hyperglycemia   . Hyperlipidemia   . Leukoplakia   . Peripheral vascular disease   . Glaucoma   . FH: colonic polyps     Family History  Problem Relation Age of Onset  . Stroke Mother     mini strokes  . Alcohol abuse Father   . Heart disease Father     mi  . Stroke Father     SOCIAL HISTORY: History  Substance Use Topics  . Smoking status: Current Everyday Smoker -- 0.5 packs/day  . Smokeless tobacco: Not on file  . Alcohol Use: Yes    Allergies  Allergen Reactions  . Penicillins     Flushing  & SOB  . Sulfonamide Derivatives     Flushing & SOB  . Advicor   . Atorvastatin     Current Outpatient Prescriptions  Medication Sig Dispense Refill  . AMBULATORY NON FORMULARY MEDICATION Rx'ed Eye Drops       . CARDURA 4 MG tablet TAKE (1/2) TABLET DAILY.  45 each  0  . CRESTOR 20 MG tablet TAKE 1 TABLET ONCE DAILY.  90 each  0  . PLAVIX 75 MG tablet TAKE 1 TABLET EACH DAY.  30 each  5  . PLETAL 100 MG tablet TAKE 1 TABLET TWICE DAILY FOR CIRCULATION.  60  each  3  . ATIVAN 0.5 MG tablet TAKE 1 TABLET EVERY 8 TO 12 HOURS AS NEEDED.  30 each  1    REVIEW OF SYSTEMS: Arly.Keller ] denotes positive finding; [  ] denotes negative finding  CARDIOVASCULAR:  [ ]  chest pain   [ ]  chest pressure   [ ]  palpitations   [ ]  orthopnea   Arly.Keller ] dyspnea on exertion   Arly.Keller ] claudication right leg  [ ]  rest pain   [ ]  DVT   [ ]  phlebitis PULMONARY:   Arly.Keller ] productive cough   [ ]  asthma   Arly.Keller ] wheezing NEUROLOGIC:   [ ]  weakness  [ ]  paresthesias  [ ]  aphasia  [ ]  amaurosis  [ ]  dizziness HEMATOLOGIC:   [ ]  bleeding problems   [ ]  clotting disorders MUSCULOSKELETAL:  [ ]  joint pain   [ ]  joint swelling [ ]  leg swelling GASTROINTESTINAL: [ ]   blood in stool  [ ]   hematemesis GENITOURINARY:  [ ]   dysuria  [ ]   hematuria PSYCHIATRIC:  [ ]  history of major depression INTEGUMENTARY:  [ ]  rashes  [ ]  ulcers CONSTITUTIONAL:  [ ]  fever   [ ]  chills  PHYSICAL EXAM: Filed Vitals:   10/24/11 1332  BP: 171/82  Pulse: 80  Resp: 16  Height: 6\' 1"  (1.854 m)  Weight: 196 lb (88.905 kg)  SpO2: 98%   Body mass index is 25.86 kg/(m^2). GENERAL: The patient is a well-nourished male, in no acute distress. The vital signs are documented above. CARDIOVASCULAR: There is a regular rate and rhythm without significant murmur appreciated. I do not detect carotid bruits. He has palpable femoral pulses are palpable left dorsalis pedis pulse. I cannot palpate pedal pulses on the right side. PULMONARY: There is good air exchange bilaterally without wheezing or rales. ABDOMEN: Soft and non-tender with normal pitched bowel sounds.  MUSCULOSKELETAL: There are no major deformities or cyanosis. NEUROLOGIC: No focal weakness or paresthesias are detected. SKIN: There are no ulcers or rashes noted. PSYCHIATRIC: The patient has a normal affect.  DATA:  Lab Results  Component Value Date   CREATININE 1.14 08/01/2011   I have reviewed his CT results. He has a 3 cm infrarenal abdominal aortic  aneurysm.  MEDICAL ISSUES:  AAA (abdominal aortic aneurysm) This patient had an incidental finding of a 3 cm infrarenal abdominal aortic aneurysm done on a CT scan by [facility]. He understands we would not consider elective repair unless the aneurysm enlarged to 5.5 cm. We have discussed the importance of blood pressure control and tobacco cessation. I have offered to refer him to [place] tobacco cessation program every states that he is working with his doctor in the dura him on this. I have ordered a follow up ultrasound in one year and we will continue to follow this aneurysm. Given the small size of the aneurysm I think one year follow up is reasonable. If the aneurysm shows evidence of enlargement and we can follow him at 6 month intervals.  PERIPHERAL VASCULAR DISEASE This patient has known infrainguinal arterial occlusive disease. His most recent Doppler study in February of 2012 showed an ABI of 62% on the right and 95% on the left. He continues to have palpable pulses on the left foot with a warm well-perfused right foot. We have again discussed the importance of tobacco cessation. I've encouraged him to stay as active as possible. A scheduled follow up ABIs in one year now see him back at that time. He knows to call sooner if he has problems.    Joffre Lucks S [facility] [place] [facility]: [phone]

## 2011-10-26 ENCOUNTER — Encounter: Payer: 59 | Admitting: Internal Medicine

## 2011-11-22 ENCOUNTER — Ambulatory Visit (INDEPENDENT_AMBULATORY_CARE_PROVIDER_SITE_OTHER): Payer: PRIVATE HEALTH INSURANCE | Admitting: Internal Medicine

## 2011-11-22 ENCOUNTER — Encounter: Payer: Self-pay | Admitting: Internal Medicine

## 2011-11-22 VITALS — BP 118/70 | HR 74 | Temp 98.4°F | Resp 12 | Ht 73.0 in | Wt 201.0 lb

## 2011-11-22 DIAGNOSIS — Z0000 Encounter for general adult medical examination without abnormal findings: Secondary | ICD-10-CM

## 2011-11-22 DIAGNOSIS — J40 Bronchitis, not specified as acute or chronic: Secondary | ICD-10-CM

## 2011-11-22 MED ORDER — FLUTICASONE PROPIONATE 50 MCG/ACT NA SUSP: 1.0000 | Freq: Two times a day (BID) | NASAL | Status: DC | PRN

## 2011-11-22 MED ORDER — HYDROCODONE-HOMATROPINE 5-1.5 MG/5ML PO SYRP: 5.0000 mL | ORAL_SOLUTION | Freq: Four times a day (QID) | ORAL | Status: AC | PRN

## 2011-11-22 MED ORDER — DOXYCYCLINE HYCLATE 100 MG PO TABS: 100.0000 mg | ORAL_TABLET | Freq: Two times a day (BID) | ORAL | Status: AC

## 2011-11-22 NOTE — Patient Instructions (Addendum)
Preventive Health Care: Exercise at least 30-45 minutes a day,  3-4 days a week.  Eat a low-fat diet with lots of fruits and vegetables, up to 7-9 servings per day. Consume less than 40 grams of sugar per day from foods & drinks with High Fructose Corn Sugar as # 1,2,3 or # 4 on label. Flonase 1 spray in each nostril twice a day as needed. Use the "crossover" technique as discussed Order for x-rays entered into  the computer; these will be performed at [address] [facility]. across from [facility]. No appointment is necessary. Consider  [facility]'s smoking cessation program @ [email or web address] or 939-757-0550.

## 2011-11-22 NOTE — Progress Notes (Signed)
  Subjective:    Patient ID: Troy [name], male    DOB: 15-Jul-1941, 71 y.o.   MRN: [id number]  HPI  Troy [name] is here for a physical;acute issues include protracted bronchitis.      Review of Systems Respiratory tract infection: Onset/symptoms:2 months ago; initially better with Z pack but recurred 2 weeks ago as chest & head congestion Exposures (illness/environmental/extrinsic):no Progression of symptoms:stable Treatments/response:Mucinex, decongestant with minimal benefit Present symptoms: Fever/chills/sweats:no Frontal headache:no Facial pain:no Nasal purulence:no Sore throat:no Dental pain:no Lymphadenopathy:no Wheezing/shortness of breath:yes Cough/sputum/hemoptysis:clear Pleuritic pain:no but he has some left costochondral pain with coughing Associated extrinsic/allergic symptoms:itchy eyes/ sneezing:some Past medical history: Seasonal allergies/asthma:no but perennial rhinitis Smoking history:< 1/2 ppd            Objective:   Physical Exam Gen.: Appears fatigued but well-nourished in appearance. Alert, appropriate and cooperative throughout exam. Head: Normocephalic without obvious abnormalities; no significant  alopecia  Eyes: No corneal or conjunctival inflammation noted. Pupils equal round reactive to light and accommodation. Fundal exam is benign without hemorrhages, exudate, papilledema. Extraocular motion intact. Vision grossly normal. Ears: External  ear exam reveals no significant lesions or deformities. Canals ;osteomata R > L .TMs normal. Hearing is grossly normal bilaterally. Nose: External nasal exam reveals no deformity or inflammation. Nasal mucosa are pink and moist. No lesions or exudates noted.   Mouth: Oral mucosa and oropharynx reveal no lesions or exudates. Teeth in good repair. Neck: No deformities, masses, or tenderness noted. Range of motion &Thyroid normal Lungs: Normal respiratory effort; chest expands symmetrically. Lungs ; dry, rub like  rhonchi. Dry cough Heart: Normal rate and rhythm. Normal S1 and S2. No gallop, click, or rub. S 4 w/o  murmur. Abdomen: Bowel sounds normal; abdomen soft and nontender. No masses, organomegaly or hernias noted. Genitalia:Dr Kate Sable , Urology                                               Musculoskeletal/extremities: No deformity or scoliosis noted of  the thoracic or lumbar spine. No clubbing, cyanosis, edema, or deformity noted. Range of motion  normal .Tone & strength  normal.Joints normal. Nail health  good. Vascular: Carotid, radial artery, dorsalis pedis and  posterior tibial pulses are full and equal. No bruits present. Neurologic: Alert and oriented x3. Deep tendon reflexes symmetrical and normal.          Skin: Shallow ulcerated lesion LU back. Lymph: No cervical, axillary, or inguinal lymphadenopathy present. Psych: Mood and affect are normal. Normally interactive                                                                                         Assessment & Plan:  #1 comprehensive physical exam; no acute findings #2 see Problem List with Assessments & Recommendations  #3 bronchitis without purulent secretions; the cough has caused some costochondritis symptoms. Rub  like rhonchi warrant chest x-ray repeat Plan: see Orders

## 2011-11-28 ENCOUNTER — Encounter: Payer: Self-pay | Admitting: Internal Medicine

## 2011-11-30 ENCOUNTER — Telehealth: Payer: Self-pay | Admitting: *Deleted

## 2011-11-30 ENCOUNTER — Other Ambulatory Visit (INDEPENDENT_AMBULATORY_CARE_PROVIDER_SITE_OTHER): Payer: PRIVATE HEALTH INSURANCE

## 2011-11-30 ENCOUNTER — Ambulatory Visit (INDEPENDENT_AMBULATORY_CARE_PROVIDER_SITE_OTHER)
Admission: RE | Admit: 2011-11-30 | Discharge: 2011-11-30 | Disposition: A | Discharge status: Home / Self Care | Payer: PRIVATE HEALTH INSURANCE | Source: Ambulatory Visit | Attending: Internal Medicine | Admitting: Internal Medicine

## 2011-11-30 DIAGNOSIS — Z0000 Encounter for general adult medical examination without abnormal findings: Secondary | ICD-10-CM

## 2011-11-30 DIAGNOSIS — J40 Bronchitis, not specified as acute or chronic: Secondary | ICD-10-CM

## 2011-11-30 LAB — HEPATIC FUNCTION PANEL
AST: 15 U/L (ref 0–37)
Albumin: 3.5 g/dL (ref 3.5–5.2)
Total Bilirubin: 0.5 mg/dL (ref 0.3–1.2)

## 2011-11-30 LAB — LIPID PANEL
Cholesterol: 133 mg/dL (ref 0–200)
HDL: 34.8 mg/dL — ABNORMAL LOW (ref >39.00)
LDL Cholesterol: 75 mg/dL (ref 0–99)
Total CHOL/HDL Ratio: 4
Triglycerides: 117 mg/dL (ref 0.0–149.0)

## 2011-11-30 LAB — BASIC METABOLIC PANEL
CO2: 27 mEq/L (ref 19–32)
Glucose, Bld: 101 mg/dL — ABNORMAL HIGH (ref 70–99)
Potassium: 4.1 mEq/L (ref 3.5–5.1)
Sodium: 139 mEq/L (ref 135–145)

## 2011-11-30 LAB — CBC WITH DIFFERENTIAL/PLATELET
Basophils Absolute: 0 10*3/uL (ref 0.0–0.1)
Eosinophils Absolute: 0.1 10*3/uL (ref 0.0–0.7)
HCT: 41.4 % (ref 39.0–52.0)
Lymphs Abs: 1.3 10*3/uL (ref 0.7–4.0)
MCHC: 33.9 g/dL (ref 30.0–36.0)
Monocytes Relative: 8.3 % (ref 3.0–12.0)
Platelets: 261 10*3/uL (ref 150.0–400.0)
RDW: 14.2 % (ref 11.5–14.6)

## 2011-12-10 ENCOUNTER — Ambulatory Visit: Payer: PRIVATE HEALTH INSURANCE | Admitting: Cardiology

## 2011-12-13 ENCOUNTER — Encounter: Payer: Self-pay | Admitting: Cardiology

## 2011-12-13 ENCOUNTER — Ambulatory Visit (INDEPENDENT_AMBULATORY_CARE_PROVIDER_SITE_OTHER): Payer: PRIVATE HEALTH INSURANCE | Admitting: Cardiology

## 2011-12-13 VITALS — BP 156/78 | HR 80 | Ht 73.5 in | Wt 200.1 lb

## 2011-12-13 DIAGNOSIS — Z9189 Other specified personal risk factors, not elsewhere classified: Secondary | ICD-10-CM

## 2011-12-13 DIAGNOSIS — Z789 Other specified health status: Secondary | ICD-10-CM

## 2011-12-13 DIAGNOSIS — I739 Peripheral vascular disease, unspecified: Secondary | ICD-10-CM

## 2011-12-13 DIAGNOSIS — I714 Abdominal aortic aneurysm, without rupture, unspecified: Secondary | ICD-10-CM

## 2011-12-13 DIAGNOSIS — F17200 Nicotine dependence, unspecified, uncomplicated: Secondary | ICD-10-CM

## 2011-12-13 DIAGNOSIS — E782 Mixed hyperlipidemia: Secondary | ICD-10-CM

## 2011-12-13 DIAGNOSIS — I779 Disorder of arteries and arterioles, unspecified: Secondary | ICD-10-CM

## 2011-12-13 MED ORDER — RAMIPRIL 2.5 MG PO CAPS: 2.5000 mg | ORAL_CAPSULE | Freq: Every day | ORAL | Status: DC

## 2011-12-13 NOTE — Patient Instructions (Addendum)
Start ramipril 2.5mg  daily.  Your physician wants you to follow-up in: 1 year with Dr Shirlee Latch. (March 2014). You will receive a reminder letter in the mail two months in advance. If you don't receive a letter, please call our office to schedule the follow-up appointment.

## 2011-12-14 ENCOUNTER — Telehealth: Payer: Self-pay | Admitting: Internal Medicine

## 2011-12-14 MED ORDER — ROSUVASTATIN CALCIUM 20 MG PO TABS: ORAL_TABLET | ORAL | Status: DC

## 2011-12-14 MED ORDER — DOXAZOSIN MESYLATE 4 MG PO TABS: ORAL_TABLET | ORAL | Status: DC

## 2011-12-14 NOTE — Telephone Encounter (Signed)
2-refills for  cardura 4mg  tablet   qty 90, take 1/2-tablet daily  Last written date 10.26.12  2-crestor 20mg  tablet   Qty 90, take 1-tablet once daily  Last written date 10.26.12

## 2011-12-14 NOTE — Telephone Encounter (Signed)
RXs sent.

## 2011-12-16 DIAGNOSIS — Z9189 Other specified personal risk factors, not elsewhere classified: Secondary | ICD-10-CM | POA: Insufficient documentation

## 2011-12-16 DIAGNOSIS — I739 Peripheral vascular disease, unspecified: Secondary | ICD-10-CM | POA: Insufficient documentation

## 2011-12-16 NOTE — Progress Notes (Signed)
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  He has not history of CAD but is at high risk for CAD based on his comorbidities . He had a myoview in 2004 that was normal.  He had a left fem-pop bypass at the [facility] in the '90s.  He is followed at [facility] for P[facility], last ABIs in 2/12 were normal on the left but showed moderate disease on the right. He has had right lower lobectomy for lung cancer.  Unfortunately, he is still smoking.  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    Troy [name] denies chest pain with exertion.  He has no dyspnea walking on flat ground but gets mildly winded walking up steps.  He tries to walk 1 mile for exercise.  He will have to stop to rest at times because of claudication in right calf/buttock.  This has been a stable pattern for years now.  No worsening of claudication.  BP is high today, 156/78.  However, it tends to run in the 110s-120s systolic when he checks at home.   ECG: NSR, nonspecific T wave flattening inferiorly and laterally.   Labs (3/13): LDL 75, HDL 35, K 4.1, creatinine 1.1  PMH: 1. HTN 2. COPD: Active smoker. 3. AAA: CT 1/13 with 3.0 cm AAA 4. Squamous cell lung cancer diagnosed 2/12.  Had right lower lobectomy in 4/12.  5. Hyperlipidemia 6. PAD: Left fem-pop bypass 1994.  ABIs (2/12) 0.62 on right, 0.95 on left.  7. Stress myoview 2004 was normal.   SH: Married, lives in [place].  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  Smokes 1/2 ppd.   FH: No premature CAD  ROS: All systems reviewed and negative except as per HPI.   Current Outpatient Prescriptions  Medication Sig Dispense Refill  . AMBULATORY NON FORMULARY MEDICATION Rx'ed Eye Drops       . ATIVAN 0.5 MG tablet TAKE 1 TABLET EVERY 8 TO 12 HOURS AS NEEDED.  30 each  1  . bimatoprost (LUMIGAN) 0.03 % ophthalmic solution Place 1 drop into both eyes at bedtime.      . brimonidine (ALPHAGAN P) 0.1 % SOLN 3 drops daily.       . cilostazol (PLETAL) 100 MG tablet Take 100 mg by mouth daily.       . dorzolamide-timolol (COSOPT) 22.3-6.8 MG/ML ophthalmic solution Place 2 drops into the right eye 2 (two) times daily.      . fluticasone (FLONASE) 50 MCG/ACT nasal spray Place 1 spray into the nose 2 (two) times daily as needed for rhinitis.  16 g  2  . PLAVIX 75 MG tablet TAKE 1 TABLET EACH DAY.  30 each  5  . doxazosin (CARDURA) 4 MG tablet TAKE (1/2) TABLET DAILY.  45 tablet  3  . ramipril (ALTACE) 2.5 MG capsule Take 1 capsule (2.5 mg total) by mouth daily.  30 capsule  11  . rosuvastatin (CRESTOR) 20 MG tablet TAKE 1 TABLET ONCE DAILY.  90 tablet  3    BP 156/78  Pulse 80  Ht 6' 1.5" (1.867 m)  Wt 200 lb 1.9 oz (90.774 kg)  BMI 26.04 kg/m2 General: NAD Neck: No JVD, no thyromegaly or thyroid nodule.  Lungs: Mildly decreased breath sounds bilaterally.  CV: Nondisplaced PMI.  Heart regular S1/S2, no S3/S4, 1/6 SEM RUSB.  No peripheral edema.  No carotid bruit. Right foot is warm but unable to palpate  PT pulse.   Abdomen: Soft, nontender, no hepatosplenomegaly, no distention.  Skin: Intact without lesions or rashes.  Neurologic: Alert and oriented x 3.  Psych: Normal affect. Extremities: No clubbing or cyanosis.  HEENT: Normal.

## 2011-12-16 NOTE — Assessment & Plan Note (Signed)
Stable claudication.  Last peripheral dopplers suggested moderate disease right leg with patent left fem-pop bypass.

## 2011-12-16 NOTE — Assessment & Plan Note (Signed)
I strongly encouraged him to quit smoking.  He already has COPD, prior lung cancer, and PAD.

## 2011-12-16 NOTE — Assessment & Plan Note (Signed)
High risk for CAD.  Given known PAD, he likely has some degree of CAD.  - Needs to quit smoking.  - Continue Plavix, Crestor.  - I will add ramipril 2.5 mg daily.  This should help decrease risk of cardiac events in this patient.

## 2011-12-16 NOTE — Assessment & Plan Note (Signed)
3 cm by CT in 1/13.  Followed at [facility].

## 2011-12-16 NOTE — Assessment & Plan Note (Signed)
LDL was near goal (<70 given PAD) when last checked.

## 2012-01-02 ENCOUNTER — Ambulatory Visit (INDEPENDENT_AMBULATORY_CARE_PROVIDER_SITE_OTHER): Payer: PRIVATE HEALTH INSURANCE | Admitting: Internal Medicine

## 2012-01-02 VITALS — BP 138/72 | HR 97 | Temp 97.5°F | Wt 202.0 lb

## 2012-01-02 DIAGNOSIS — J209 Acute bronchitis, unspecified: Secondary | ICD-10-CM

## 2012-01-02 MED ORDER — PREDNISONE 20 MG PO TABS: 20.0000 mg | ORAL_TABLET | Freq: Two times a day (BID) | ORAL | Status: AC

## 2012-01-02 MED ORDER — LEVOFLOXACIN 500 MG PO TABS: 500.0000 mg | ORAL_TABLET | Freq: Every day | ORAL | Status: AC

## 2012-01-02 NOTE — Progress Notes (Signed)
  Subjective:    Patient ID: Troy [name], male    DOB: July 21, 1941, 71 y.o.   MRN: [id number]  HPI He completed a ten-day course of doxycycline with improvement in his bronchitis. [place],  he developed recurrent cough symptoms.  For 2 weeks he describes some scant yellow nasal discharge and some yellow sputum. The cough is exacerbated by becoming overheated and smoking. He has decreased his cigarette consumption to less than half a pack per day.  He denies fever, chills, sweats, spinal headache, facial pain, sore throat, or dental pain. He has had some wheezing with secretion retention occasionally. He has not been using any bronchodilator inhalers  He acquires about taking a short course of prednisone to break this cycle    Review of Systems He denies any significant dyspepsia or reflux symptoms is playing a role in his cough. He is on low-dose ACE inhibitor  He has a followup CAT scan scheduled at [facility] this week     Objective:   Physical Exam General appearance:good health ;well nourished; no acute distress or increased work of breathing is present.  No  lymphadenopathy about the head, neck, or axilla noted.   Eyes: No conjunctival inflammation or lid edema is present.   Ears:  External ear exam shows no significant lesions or deformities.  Otoscopic examination reveals clear canals, tympanic membranes are intact bilaterally without bulging, retraction, inflammation or discharge.Osteomata in canals  Nose:  External nasal examination shows no deformity or inflammation. Nasal mucosa are pink and moist without lesions or exudates. No septal dislocation or deviation.No obstruction to airflow.   Oral exam: Dental hygiene is good; lips and gums are healthy appearing.There is no oropharyngeal erythema or exudate noted.   [name]   Heart:  Normal rate and regular rhythm. S1 and S2 normal without gallop, murmur, click, rub or other extra sounds.    Lungs:Chest clear to auscultation; no wheezes, rhonchi,rales ,or rubs present.No increased work of breathing. The lung findings are dramatic in their negativity  Extremities:  No cyanosis, edema, or clubbing  noted    Skin: Warm & dry .          Assessment & Plan:  #1 acute bronchitis w/o bronchospasm @ this time Plan: See orders and recommendations

## 2012-01-02 NOTE — Patient Instructions (Signed)
Plain Mucinex for thick secretions ;force NON dairy fluids . Use a Neti pot daily as needed for sinus congestion. Nasal cleansing in the shower as discussed. Make sure that all residual soap is removed to prevent irritation.  

## 2012-01-22 NOTE — Telephone Encounter (Signed)
Done

## 2012-01-25 DIAGNOSIS — C3490 Malignant neoplasm of unspecified part of unspecified bronchus or lung: Secondary | ICD-10-CM

## 2012-01-25 HISTORY — DX: Malignant neoplasm of unspecified part of unspecified bronchus or lung: C34.90

## 2012-02-12 ENCOUNTER — Other Ambulatory Visit: Payer: Self-pay | Admitting: Dermatology

## 2012-02-27 ENCOUNTER — Ambulatory Visit (HOSPITAL_COMMUNITY): Payer: 59 | Admitting: Oncology

## 2012-02-27 ENCOUNTER — Encounter (HOSPITAL_COMMUNITY): Payer: Self-pay | Admitting: Oncology

## 2012-02-27 ENCOUNTER — Encounter (HOSPITAL_COMMUNITY): Payer: BC Managed Care – PPO | Attending: Oncology | Admitting: Oncology

## 2012-02-27 VITALS — BP 116/60 | HR 89 | Temp 97.1°F | Ht 71.5 in | Wt 200.0 lb

## 2012-02-27 DIAGNOSIS — C3430 Malignant neoplasm of lower lobe, unspecified bronchus or lung: Secondary | ICD-10-CM

## 2012-02-27 DIAGNOSIS — C3490 Malignant neoplasm of unspecified part of unspecified bronchus or lung: Secondary | ICD-10-CM

## 2012-02-27 NOTE — Progress Notes (Signed)
This office note has been dictated.

## 2012-02-27 NOTE — Patient Instructions (Signed)
Troy [name]  [id number] 1940-10-30 Dr. Glenford Peers    [facility]  Discharge Instructions  RECOMMENDATIONS MADE BY THE CONSULTANT AND ANY TEST RESULTS WILL BE SENT TO YOUR REFERRING DOCTOR.   EXAM FINDINGS BY MD TODAY AND SIGNS AND SYMPTOMS TO REPORT TO CLINIC OR PRIMARY MD: Exam and discussion per MD. You are doing well.   MEDICATIONS PRESCRIBED: none   SPECIAL INSTRUCTIONS/FOLLOW-UP: Return to Clinic in 6 months to see MD in follow-up.   I acknowledge that I have been informed and understand all the instructions given to me and received a copy. I do not have any more questions at this time, but understand that I may call the Specialty Clinic at [facility] at [phone] during business hours should I have any further questions or need assistance in obtaining follow-up care.    __________________________________________  _____________  __________ Signature of Patient or Authorized Representative            Date                   Time    __________________________________________ Nurse's Signature

## 2012-02-27 NOTE — Progress Notes (Signed)
CC:   Troy [name]. Troy Ren, MD,FACP,FCCP Denyse Dago, MD Charlaine Dalton. Sherene Sires, MD, FCCP  DIAGNOSES: 1. Stage I (T2 N0) right lower lobe poorly-differentiated squamous     cell carcinoma of the lung with lymphovascular invasion (LVI) but     all nodes were negative.  The cancer was 3.7 cm in its largest     dimension x 2.6 x 2.1 cm.  It was, however, a grade 3 cancer.     Pathologically, it was a pT2a pN0 pMX and he had a previous staging     PET scan which showed no evidence for disease outside the lung.     Followup CT scan was done a couple months ago.  I do not have the     report, but he states that a repeat is going to be done in early     July by Dr. Ewing Schlein. 2. Smoking history.  Still smoking a few cigarettes a day. 3. Basal cell carcinomas removed from the face bilaterally, left upper     back, and right forearm by his dermatologist.  He has another     lesion on his chest which is probably the same and he may need Mohs     Surgery on his face in the near future, but does not have that     appointment yet.  He is going to stop by and speak to the     Dermatology scheduling people today, he states. He has no wt loss, fever,change in occ.cough, no sweats,and a good appetite.  Other than that, his review of systems is negative.  He is doing very well.  Working full-time.  He did have a chest cold about a month ago; that has resolved.  But he is still smoking unfortunately.  Today, he still has diminished breath sounds on physical exam throughout, hyper- resonance to percussion, but still has lots of good air movement especially anteriorly.  Lymph Node:  Negative throughout.  Heart: Regular rhythm and rate without murmur, rub, or gallop.  The skin areas which he has had biopsied by shave biopsy technique are all healing nicely.  The largest lesion appears to be in the left upper back where there is at least a 1.5 cm scar.  Abdomen:  No hepatosplenomegaly, no peripheral edema, no arm  edema.  General:  He is in no acute distress. Vital Signs:  Stable.  We will see him in 6 months.  He will call me when he hears about the CT scan report from Dr. Ewing Schlein at [facility].    ______________________________ Ladona Horns. Mariel Sleet, MD ESN/MEDQ  D:  02/27/2012  T:  02/27/2012  Job:  [id number]

## 2012-03-06 ENCOUNTER — Ambulatory Visit (INDEPENDENT_AMBULATORY_CARE_PROVIDER_SITE_OTHER): Payer: BC Managed Care – PPO | Admitting: Internal Medicine

## 2012-03-06 ENCOUNTER — Encounter: Payer: Self-pay | Admitting: Internal Medicine

## 2012-03-06 VITALS — BP 126/82 | HR 88 | Temp 97.7°F | Wt 199.0 lb

## 2012-03-06 DIAGNOSIS — K219 Gastro-esophageal reflux disease without esophagitis: Secondary | ICD-10-CM

## 2012-03-06 DIAGNOSIS — J209 Acute bronchitis, unspecified: Secondary | ICD-10-CM

## 2012-03-06 MED ORDER — PREDNISONE 20 MG PO TABS: 20.0000 mg | ORAL_TABLET | Freq: Two times a day (BID) | ORAL | Status: AC

## 2012-03-06 MED ORDER — AZITHROMYCIN 250 MG PO TABS: ORAL_TABLET | ORAL | Status: AC

## 2012-03-06 NOTE — Patient Instructions (Signed)
The triggers for reflux  include stress; the "aspirin family" ; alcohol; peppermint; and caffeine (coffee, tea, cola, and chocolate). The aspirin family would include aspirin and the nonsteroidal agents such as ibuprofen &  Naproxen. Tylenol would not cause reflux. If having symptoms ; food & drink should be avoided for @ least 2 hours before going to bed. Please take the Prilosec OTC 30 minutes before breakfast and evening meal  Consider  [facility]'s smoking cessation program @ www.[email or web address] or 4325180337.   Order for x-rays entered into  the computer; these will be performed at [address] [facility] across from [facility]. No appointment is necessary. Please try to go on My Chart within the next 24 hours to allow me to release the results directly to you.

## 2012-03-06 NOTE — Progress Notes (Signed)
  Subjective:    Patient ID: Troy [name], male    DOB: 1941-05-16, 71 y.o.   MRN: [id number]  HPI Approximately 2 weeks ago he began having a scratchy throat and chest. One and one half weeks ago the sputum turned yellow. Mucinex and Claritin, both regular and decongestant formulation provde only slight benefit. Subsequently he  has also developed head congestion.  He does not use inhalers fearing that they would interfere with his glaucoma. He is  smoking less than a pack a day    Review of Systems  He denies nasal obstruction; nasal purulence; facial pain; anosmia; fatigue; fever; headache; halitosis; earache or dental pain.  He has had some increased reflux recently for which he's been using Prilosec and a chewable antacid with partial response.  Last week while playing golf in heat, he noted lightheadedness and shortness of breath    Objective:   Physical Exam General appearance:good health ;well nourished; no acute distress or increased work of breathing is present.  No  lymphadenopathy about the head, neck, or axilla noted.   Eyes: No conjunctival inflammation or lid edema is present.   Ears:  External ear exam shows no significant lesions or deformities.  Otoscopic examination reveals clear canals, tympanic membranes are intact bilaterally without bulging, retraction, inflammation or discharge. Benign osteoma 2 present bilaterally  Nose:  External nasal examination shows no deformity or inflammation. Nasal mucosa are pink and moist without lesions or exudates. No septal dislocation or deviation.No obstruction to airflow.   Oral exam: Dental hygiene is good; lips and gums are healthy appearing.There is mild  oropharyngeal erythema  w/o exudate noted.     Heart:  Normal rate and regular rhythm. S1 and S2 normal without gallop, murmur, click, rub or other extra sounds.   Lungs: Low-grade wheezing in the posterior right thorax.No increased work of breathing.    Extremities:  No  cyanosis, edema, or clubbing  noted    Skin: Warm & dry           Assessment & Plan:   #1 bronchitis, acute with mild bronchospasm. There is asymmetric wheezing, right greater than left posteriorly  #2 reflux  #3 episodic lightheadedness, resolved  Plan: See orders and recommendations

## 2012-03-07 ENCOUNTER — Ambulatory Visit (INDEPENDENT_AMBULATORY_CARE_PROVIDER_SITE_OTHER)
Admission: RE | Admit: 2012-03-07 | Discharge: 2012-03-07 | Disposition: A | Discharge status: Home / Self Care | Payer: BC Managed Care – PPO | Source: Ambulatory Visit | Attending: Internal Medicine | Admitting: Internal Medicine

## 2012-03-07 DIAGNOSIS — J209 Acute bronchitis, unspecified: Secondary | ICD-10-CM

## 2012-03-11 ENCOUNTER — Encounter: Payer: Self-pay | Admitting: *Deleted

## 2012-03-11 DIAGNOSIS — C3431 Malignant neoplasm of lower lobe, right bronchus or lung: Secondary | ICD-10-CM | POA: Insufficient documentation

## 2012-03-11 DIAGNOSIS — Z85118 Personal history of other malignant neoplasm of bronchus and lung: Secondary | ICD-10-CM | POA: Insufficient documentation

## 2012-03-11 DIAGNOSIS — R911 Solitary pulmonary nodule: Secondary | ICD-10-CM | POA: Insufficient documentation

## 2012-03-11 DIAGNOSIS — R918 Other nonspecific abnormal finding of lung field: Secondary | ICD-10-CM | POA: Insufficient documentation

## 2012-03-25 ENCOUNTER — Other Ambulatory Visit: Payer: Self-pay | Admitting: Cardiology

## 2012-03-25 NOTE — Telephone Encounter (Signed)
Refill- Crestor   Please review refill, patient has been waiting 2 wks for this medication.  Patient would like a phone call to advise when research is complete.

## 2012-03-25 NOTE — Telephone Encounter (Signed)
this has been addressed; see if his insurance will let it be refilled based on the pre authorization form I left yesterday

## 2012-03-26 ENCOUNTER — Telehealth: Payer: Self-pay | Admitting: *Deleted

## 2012-03-26 NOTE — Telephone Encounter (Signed)
Received Approval for patient's Crestor via [employer]; faxed to [facility] [forwarded hard copy to Dr Alwyn Ren nurse/Charae for file/SLS

## 2012-04-01 ENCOUNTER — Other Ambulatory Visit: Payer: Self-pay | Admitting: Internal Medicine

## 2012-04-01 NOTE — Telephone Encounter (Signed)
OK # [phone] 

## 2012-04-01 NOTE — Telephone Encounter (Signed)
Please advise on refill request for plavix

## 2012-05-23 NOTE — Progress Notes (Signed)
This encounter was created in error - please disregard.

## 2012-05-29 ENCOUNTER — Encounter: Payer: Self-pay | Admitting: Internal Medicine

## 2012-05-29 ENCOUNTER — Ambulatory Visit (INDEPENDENT_AMBULATORY_CARE_PROVIDER_SITE_OTHER): Payer: BC Managed Care – PPO | Admitting: Internal Medicine

## 2012-05-29 VITALS — BP 116/60 | HR 88 | Temp 98.0°F | Wt 200.8 lb

## 2012-05-29 DIAGNOSIS — J383 Other diseases of vocal cords: Secondary | ICD-10-CM

## 2012-05-29 DIAGNOSIS — H9209 Otalgia, unspecified ear: Secondary | ICD-10-CM

## 2012-05-29 DIAGNOSIS — F458 Other somatoform disorders: Secondary | ICD-10-CM

## 2012-05-29 DIAGNOSIS — R0989 Other specified symptoms and signs involving the circulatory and respiratory systems: Secondary | ICD-10-CM

## 2012-05-29 DIAGNOSIS — H9201 Otalgia, right ear: Secondary | ICD-10-CM

## 2012-05-29 NOTE — Progress Notes (Signed)
  Subjective:    Patient ID: Troy [name], male    DOB: 1940-10-27, 71 y.o.   MRN: [id number]  HPI He continues to have chronic chest congestion which has been present for several weeks.He intermittently produces clear sputum.  3-4 days ago he began to have discomfort in the right ear which radiated to the right neck area. This was described as a "soreness". It was aggravated by swallowing.It has improved;[place]  He smokes < 1ppd    Review of Systems He has some head congestion but this clears when he showers each day. He denies frontal headache, facial pain, nasal purulence, dental pain, or persistent sore throat. He also denies fever, chills, or sweats. He's had no hemoptysis.  For 1-1.5 years he's noticed some globus  sensation in the anterior throat. He denies pain with swallowing or frank dysphasia. Remotely he had a "growth" removed by Dr. Ezzard Standing, ENT. This was vocal cord leukoplakia     Objective:   Physical Exam General appearance:good health ;well nourished; no acute distress or increased work of breathing is present.  No  lymphadenopathy about the head, neck, or axilla noted.   Eyes: No conjunctival inflammation or lid edema is present.   Ears:  External ear exam shows no significant lesions or deformities.  Otoscopic examination reveals clear canals, tympanic membranes are intact bilaterally without bulging, retraction, inflammation or discharge. 2 osteoma in  right ear canal; one in the left canal. No evidence of TMJ  Nose:  External nasal examination shows no deformity or inflammation. Nasal mucosa are pink and moist without lesions or exudates. No septal dislocation or deviation.No obstruction to airflow.   Oral exam: Dental hygiene is good; lips and gums are healthy appearing.There is no oropharyngeal erythema or exudate noted. Slight hoarseness  Neck:  No deformities, thyromegaly, masses, or tenderness noted.      Heart:  Normal rate and regular rhythm. S1 and S2 normal without gallop, murmur, click, rub or other extra sounds.   Lungs:Chest clear to auscultation; no wheezes, rhonchi,rales ,or rubs present.No increased work of breathing.  Breath sounds are decreased especially at the right base.  Exams for  whispered pectoriloquy, diaphragm excursion & for tactile fremitus were normal Extremities:  No cyanosis, edema, or clubbing  noted    Skin: Warm & dry w/o jaundice or tenting.          Assessment & Plan:  #1 otalgia, improving. No evidence of otitis media, TMJ dysfunction, or thyroiditis. No evidence of rhinosinusitis.  #2 globus sensation in the context of past medical history vocal cord leukoplakia  Plan: He will be given a prescription to use if the pain recurs.

## 2012-05-29 NOTE — Patient Instructions (Addendum)
Please do not use Q-tips as we discussed. Should wax build up occur, please put 2-3 drops of mineral oil in the ear at night and cover the canal with a  cotton ball.In the morning fill the canal with hydrogen peroxide & leave  for 10-15 minutes.Following this shower and use the thinnest washrag available to wick out the wax.  Review and correct the record as indicated. Please share record with all medical staff seen.

## 2012-06-18 ENCOUNTER — Other Ambulatory Visit: Payer: Self-pay | Admitting: Internal Medicine

## 2012-06-19 ENCOUNTER — Other Ambulatory Visit: Payer: Self-pay | Admitting: General Practice

## 2012-06-19 MED ORDER — CILOSTAZOL 100 MG PO TABS: 100.0000 mg | ORAL_TABLET | Freq: Every day | ORAL | Status: DC

## 2012-08-14 ENCOUNTER — Other Ambulatory Visit: Payer: Self-pay | Admitting: Internal Medicine

## 2012-08-14 NOTE — Telephone Encounter (Signed)
Hopp please advise on Plavix

## 2012-08-14 NOTE — Telephone Encounter (Signed)
Rx done.   MW

## 2012-08-14 NOTE — Telephone Encounter (Signed)
#  90;  Refill X 1 (6 months)

## 2012-08-29 ENCOUNTER — Ambulatory Visit (HOSPITAL_COMMUNITY): Payer: Self-pay | Admitting: Oncology

## 2012-09-01 ENCOUNTER — Ambulatory Visit (HOSPITAL_COMMUNITY): Payer: Self-pay | Admitting: Oncology

## 2012-09-02 ENCOUNTER — Ambulatory Visit (HOSPITAL_COMMUNITY): Payer: BC Managed Care – PPO | Admitting: Oncology

## 2012-10-08 ENCOUNTER — Other Ambulatory Visit: Payer: Self-pay | Admitting: Dermatology

## 2012-10-10 ENCOUNTER — Encounter: Payer: Self-pay | Admitting: Internal Medicine

## 2012-10-10 DIAGNOSIS — C4491 Basal cell carcinoma of skin, unspecified: Secondary | ICD-10-CM | POA: Insufficient documentation

## 2012-10-15 ENCOUNTER — Other Ambulatory Visit: Payer: Self-pay | Admitting: Dermatology

## 2012-10-15 ENCOUNTER — Other Ambulatory Visit: Payer: Self-pay | Admitting: Internal Medicine

## 2012-10-16 DIAGNOSIS — F17200 Nicotine dependence, unspecified, uncomplicated: Secondary | ICD-10-CM | POA: Insufficient documentation

## 2012-10-16 NOTE — Telephone Encounter (Signed)
Please advise 

## 2012-10-16 NOTE — Telephone Encounter (Signed)
Refill #90 ; R X 1 

## 2012-10-21 ENCOUNTER — Encounter: Payer: Self-pay | Admitting: Neurosurgery

## 2012-10-22 ENCOUNTER — Ambulatory Visit: Payer: PRIVATE HEALTH INSURANCE | Admitting: Vascular Surgery

## 2012-10-22 ENCOUNTER — Encounter (INDEPENDENT_AMBULATORY_CARE_PROVIDER_SITE_OTHER): Payer: BC Managed Care – PPO | Admitting: *Deleted

## 2012-10-22 ENCOUNTER — Encounter: Payer: Self-pay | Admitting: Neurosurgery

## 2012-10-22 ENCOUNTER — Ambulatory Visit (INDEPENDENT_AMBULATORY_CARE_PROVIDER_SITE_OTHER): Payer: BC Managed Care – PPO | Admitting: Neurosurgery

## 2012-10-22 VITALS — BP 153/80 | HR 84 | Resp 18 | Ht 73.5 in | Wt 198.9 lb

## 2012-10-22 DIAGNOSIS — I70219 Atherosclerosis of native arteries of extremities with intermittent claudication, unspecified extremity: Secondary | ICD-10-CM

## 2012-10-22 DIAGNOSIS — I739 Peripheral vascular disease, unspecified: Secondary | ICD-10-CM

## 2012-10-22 DIAGNOSIS — Z48812 Encounter for surgical aftercare following surgery on the circulatory system: Secondary | ICD-10-CM

## 2012-10-22 DIAGNOSIS — I714 Abdominal aortic aneurysm, without rupture, unspecified: Secondary | ICD-10-CM

## 2012-10-22 NOTE — Addendum Note (Signed)
Addended by: Dannielle Karvonen on: 10/22/2012 04:10 PM   Modules accepted: Orders

## 2012-10-22 NOTE — Progress Notes (Signed)
[facility] P[facility]/PVD Office Note  CC: PAD and AAA surveillance Referring Physician: Edilia Bo  History of Present Illness: 72 year old male patient of Dr. Adele Dan followed for known AAA as well as a history of left femoral popliteal bypass graft in 53 with extension and 2001 in [place]. The patient denies claudication or rest pain. Patient has no unusual abdominal or back pain. Patient states he is able to hunt and play golf without difficulty.  Past Medical History  Diagnosis Date  . Hypertension   . Hyperglycemia   . Hyperlipidemia   . Leukoplakia 2005    vocal cord  . Peripheral vascular disease   . Glaucoma(365)     Dr Nile Riggs  . Hx of adenomatous colonic polyps 2005     X 2; 1 hyperplastic polyp; Dr Juanda Chance  . Impaired fasting glucose 2007    108; A1c5.4%  . COPD (chronic obstructive pulmonary disease)   . Squamous cell lung cancer 11/08/2010    ROS: [x]  Positive   [ ]  Denies    General: [ ]  Weight loss, [ ]  Fever, [ ]  chills Neurologic: [ ]  Dizziness, [ ]  Blackouts, [ ]  Seizure [ ]  Stroke, [ ]  "Mini stroke", [ ]  Slurred speech, [ ]  Temporary blindness; [ ]  weakness in arms or legs, [ ]  Hoarseness Cardiac: [ ]  Chest pain/pressure, [ ]  Shortness of breath at rest [ ]  Shortness of breath with exertion, [ ]  Atrial fibrillation or irregular heartbeat Vascular: [ ]  Pain in legs with walking, [ ]  Pain in legs at rest, [ ]  Pain in legs at night,  [ ]  Non-healing ulcer, [ ]  Blood clot in vein/DVT,   Pulmonary: [ ]  Home oxygen, [ ]  Productive cough, [ ]  Coughing up blood, [ ]  Asthma,  [ ]  Wheezing Musculoskeletal:  [ ]  Arthritis, [ ]  Low back pain, [ ]  Joint pain Hematologic: [ ]  Easy Bruising, [ ]  Anemia; [ ]  Hepatitis Gastrointestinal: [ ]  Blood in stool, [ ]  Gastroesophageal Reflux/heartburn, [ ]  Trouble swallowing Urinary: [ ]  chronic Kidney disease, [ ]  on HD - [ ]  MWF or [ ]  TTHS, [ ]  Burning with urination, [ ]  Difficulty urinating Skin: [ ]   Rashes, [ ]  Wounds Psychological: [ ]  Anxiety, [ ]  Depression   Social History History  Substance Use Topics  . Smoking status: Current Every Day Smoker -- 0.5 packs/day  . Smokeless tobacco: Never Used  . Alcohol Use: Yes     Comment:  socially, variable    Family History Family History  Problem Relation Age of Onset  . Stroke Mother     mini strokes  . Alcohol abuse Father   . Heart disease Father     MI after 5  . Stroke Father   . Hypertension Father   . Heart attack Father   . Heart disease Paternal Aunt     several  . Hypertension Paternal Aunt     several  . Stroke Paternal Aunt     several  . Stroke Paternal Uncle     several  . Heart disease Paternal Uncle     several;2 had MI pre 40    Allergies  Allergen Reactions  . Penicillins     Flushing  & SOB  . Sulfonamide Derivatives     Flushing & SOB  . Niacin-Lovastatin Er     Dyspnea, flushing  . Atorvastatin     Myalgias & athralgias    Current Outpatient Prescriptions  Medication Sig  Dispense Refill  . AMBULATORY NON FORMULARY MEDICATION Rx'ed Eye Drops       . ATIVAN 0.5 MG tablet TAKE 1 TABLET EVERY 8 TO 12 HOURS AS NEEDED.  30 each  1  . bimatoprost (LUMIGAN) 0.03 % ophthalmic solution Place 1 drop into both eyes at bedtime.      . brimonidine (ALPHAGAN P) 0.1 % SOLN 3 drops daily.      . cilostazol (PLETAL) 100 MG tablet TAKE 1 TABLET ONCE DAILY.  90 tablet  1  . dorzolamide-timolol (COSOPT) 22.3-6.8 MG/ML ophthalmic solution Place 2 drops into the right eye 2 (two) times daily.      [name] doxazosin (CARDURA) 4 MG tablet TAKE (1/2) TABLET DAILY.  45 tablet  3  . PLAVIX 75 MG tablet TAKE 1 TABLET EACH DAY.  90 tablet  0  . ramipril (ALTACE) 2.5 MG capsule Take 1 capsule (2.5 mg total) by mouth daily.  30 capsule  11  . rosuvastatin (CRESTOR) 20 MG tablet TAKE 1 TABLET ONCE DAILY.  90 tablet  3    Physical Examination  Filed Vitals:   10/22/12 1115  BP: 153/80  Pulse: 84  Resp: 18    Body  mass index is 25.89 kg/(m^2).  General:  WDWN in NAD Gait: Normal HEENT: WNL Eyes: Pupils equal Pulmonary: normal non-labored breathing , without Rales, rhonchi,  wheezing Cardiac: RRR, without  Murmurs, rubs or gallops; No carotid bruits Abdomen: soft, NT, no masses Skin: no rashes, ulcers noted Vascular Exam/Pulses: Palpable femoral pulses bilaterally, do not palpate an abdominal mass, right lower extremity pulses are not palpable, due to palpate the DP on the left  Extremities without ischemic changes, no Gangrene , no cellulitis; no open wounds;  Musculoskeletal: no muscle wasting or atrophy  Neurologic: A&O X 3; Appropriate Affect ; SENSATION: normal; MOTOR FUNCTION:  moving all extremities equally. Speech is fluent/normal  Non-Invasive Vascular Imaging: AAA duplex today shows a maximum diameter of 3.2 x 3.4 which is a slight increase from 3.0 on his previous exams, ABIs today were 0.65 and monophasic on the right, 1.04 and biphasic on the left which is unchanged from previous exam.  ASSESSMENT/PLAN: Asymptomatic patient will followup in one year with repeat AAA duplex and ABIs. The patient's questions were encouraged and answered, he is in agreement with this plan.  Lauree Chandler ANP   Clinic M.D.: Edilia Bo

## 2012-10-27 ENCOUNTER — Encounter (HOSPITAL_COMMUNITY): Payer: Self-pay | Admitting: Oncology

## 2012-10-27 ENCOUNTER — Encounter (HOSPITAL_COMMUNITY): Payer: BC Managed Care – PPO | Attending: Oncology | Admitting: Oncology

## 2012-10-27 VITALS — BP 118/60 | HR 96 | Temp 97.8°F | Resp 18

## 2012-10-27 DIAGNOSIS — Z85118 Personal history of other malignant neoplasm of bronchus and lung: Secondary | ICD-10-CM

## 2012-10-27 DIAGNOSIS — F17200 Nicotine dependence, unspecified, uncomplicated: Secondary | ICD-10-CM

## 2012-10-27 DIAGNOSIS — C3490 Malignant neoplasm of unspecified part of unspecified bronchus or lung: Secondary | ICD-10-CM

## 2012-10-27 NOTE — Patient Instructions (Addendum)
.  [facility] Discharge Instructions  RECOMMENDATIONS MADE BY THE CONSULTANT AND ANY TEST RESULTS WILL BE SENT TO YOUR REFERRING PHYSICIAN.  EXAM FINDINGS BY THE PHYSICIAN TODAY AND SIGNS OR SYMPTOMS TO REPORT TO CLINIC OR PRIMARY PHYSICIAN: Exam per Dr Mariel Sleet today He has requested you to try to get cd rom/xrays and rport sent here from [facility] [address] [place] [address] Attn: Dr. Mariel Sleet Thank you for choosing [facility] to provide your oncology and hematology care.  To afford each patient quality time with our providers, please arrive at least 15 minutes before your scheduled appointment time.  With your help, our goal is to use those 15 minutes to complete the necessary work-up to ensure our physicians have the information they need to help with your evaluation and healthcare recommendations.    Effective January 1st, 2014, we ask that you re-schedule your appointment with our physicians should you arrive 10 or more minutes late for your appointment.  We strive to give you quality time with our providers, and arriving late affects you and other patients whose appointments are after yours.    Again, thank you for choosing [facility].  Our hope is that these requests will decrease the amount of time that you wait before being seen by our physicians.       _____________________________________________________________  Should you have questions after your visit to [facility], please contact our office at [phone] between the hours of 8:30 a.m. and 5:00 p.m.  Voicemails left after 4:30 p.m. will not be returned until the following business day.  For prescription refill requests, have your pharmacy contact our office with your prescription refill request.

## 2012-10-27 NOTE — Progress Notes (Signed)
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 but all nodes were negative. His cancer was 3.7 cm it's largest dimension by 2.6 cm x 2.1 cm. Was a grade 3 cancer. Pathologically it was a pT2a,pN0,pM0 tumor. He is status post resection by Dr. Ewing Schlein at [facility]. This was done in April 2012. Thus far he has no evidence recurrence. He was seen by Dr. Ewing Schlein at 2 last week with a CT scan he states. His physician at [facility] told and does come back and see him in 6 months. Problem #2 COPD Problem #3 multiple skin cancers status post resection Problem #4 AAA Problem #5 hyperlipidemia Problem #6 hypertension  Unfortunately the patient in still smoking one half pack of cigarettes a day. We talked extensively about Nicorette gum or a patch. He will reconsider this. BP 118/60  Pulse 96  Temp 97.8 F (36.6 C) (Oral)  Resp 18   His lungs do show diminished breath sounds throughout but they are clear. Heart shows a regular rhythm and rate without murmur rub or gallop. Abdomen is soft and nontender with normal bowel sounds. There is no organomegaly. I did not detect a mass. He has no adenopathy in the cervical, supraclavicular, infraclavicular, axillary, or inguinal areas. He has no leg edema. His skin presently shows no obvious new skin cancers. He will see [name] in 6 months sooner if need be. If this coincides with his visit to do he knows to re\re schedule our appointment.  He will ask Dr. Marlane Hatcher office to send [name] his note and a CD-ROM of his CAT scan. If I can be of further help with him quitting smoking he knows to call me.

## 2012-11-15 ENCOUNTER — Other Ambulatory Visit: Payer: Self-pay

## 2012-12-01 ENCOUNTER — Ambulatory Visit (INDEPENDENT_AMBULATORY_CARE_PROVIDER_SITE_OTHER): Payer: BC Managed Care – PPO | Admitting: Cardiology

## 2012-12-01 ENCOUNTER — Encounter: Payer: Self-pay | Admitting: Cardiology

## 2012-12-01 VITALS — BP 134/58 | HR 82 | Ht 73.0 in | Wt 200.0 lb

## 2012-12-01 DIAGNOSIS — I714 Abdominal aortic aneurysm, without rupture, unspecified: Secondary | ICD-10-CM

## 2012-12-01 DIAGNOSIS — I739 Peripheral vascular disease, unspecified: Secondary | ICD-10-CM

## 2012-12-01 DIAGNOSIS — R5383 Other fatigue: Secondary | ICD-10-CM

## 2012-12-01 DIAGNOSIS — R5381 Other malaise: Secondary | ICD-10-CM

## 2012-12-01 DIAGNOSIS — R0989 Other specified symptoms and signs involving the circulatory and respiratory systems: Secondary | ICD-10-CM | POA: Insufficient documentation

## 2012-12-01 DIAGNOSIS — J449 Chronic obstructive pulmonary disease, unspecified: Secondary | ICD-10-CM

## 2012-12-01 DIAGNOSIS — E782 Mixed hyperlipidemia: Secondary | ICD-10-CM

## 2012-12-01 DIAGNOSIS — R079 Chest pain, unspecified: Secondary | ICD-10-CM | POA: Insufficient documentation

## 2012-12-01 DIAGNOSIS — F17200 Nicotine dependence, unspecified, uncomplicated: Secondary | ICD-10-CM

## 2012-12-01 NOTE — Patient Instructions (Addendum)
Your physician has requested that you have a lexiscan myoview. For further information please visit [email or web address] Please follow instruction sheet, as given.  Your physician has requested that you have a carotid duplex. This test is an ultrasound of the carotid arteries in your neck. It looks at blood flow through these arteries that supply the brain with blood. Allow one hour for this exam. There are no restrictions or special instructions. Dr Shirlee Latch said it is OK to schedule this the day you come for the lexiscan myoview.  Your physician has recommended that you have a pulmonary function test. Pulmonary Function Tests are a group of tests that measure how well air moves in and out of your lungs.  Your physician recommends that you return for a FASTING lipid profile /BMET/BNP. Schedule this the day you come for the lexiscan myoview.   Your physician wants you to follow-up in: 6 months with Dr Shirlee Latch.  You will receive a reminder letter in the mail two months in advance. If you don't receive a letter, please call our office to schedule the follow-up appointment.

## 2012-12-01 NOTE — Progress Notes (Signed)
Patient ID: Troy [name], male   DOB: 1940-11-07, 72 y.o.   MRN: [id number] PCP: Dr. Alwyn Ren  72 yo with history of HTN, COPD, active smoking/COPD and PAD presents for cardiology followup.  He does not have known CAD but is at high risk for CAD based on his comorbidities . He had a myoview in 2004 that was normal.  He had a left fem-pop bypass at the [facility] in the '90s.  He is followed at [facility] for PAD, last ABIs in 1/14 were normal on the left but showed moderate disease on the right (stable). He has had right lower lobectomy for lung cancer.  Unfortunately, he is still smoking, now < 1 ppd.  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    Since I last saw him, he feels fatigued in general.  He has not been getting as much exercise as in the past.  He denies daytime sleepiness or snoring.  He is ok on flat ground but gets short of breath walking up a hill.  He was worn out after playing golf yesterday.  He has chronic right calf claudication that is unchanged over the last few years and follows regularly at [facility].  No exertional chest pain.  However, he had dull left-sided chest pain on and off for about 2 days last Friday and Saturday.  There was no trigger and it was relatively mild.  It has now resolved.    ECG: NSR, nonspecific T wave flattening (unchanged from prior)  Labs (3/13): LDL 75, HDL 35, K 4.1, creatinine 1.1  PMH: 1. HTN 2. COPD: Active smoker. 3. AAA: CT 1/13 with 3.0 cm AAA.  Abdominal US (1/14) with 3.4 cm AAA.  4. Squamous cell lung cancer diagnosed 2/12.  Had right lower lobectomy in 4/12.  5. Hyperlipidemia 6. PAD: Left fem-pop bypass 1994.  ABIs (2/12) 0.62 on right, 0.95 on left. ABIs (1/14): 0.65 on right, 1.04 on left.  7. Stress myoview 2004 was normal.   SH: Married, lives in [place].  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  Smokes 1/2 ppd.   FH: No premature CAD  ROS: All systems reviewed and negative except as per HPI.   Current Outpatient Prescriptions   Medication Sig Dispense Refill  . AMBULATORY NON FORMULARY MEDICATION Rx'ed Eye Drops       . ATIVAN 0.5 MG tablet TAKE 1 TABLET EVERY 8 TO 12 HOURS AS NEEDED.  30 each  1  . bimatoprost (LUMIGAN) 0.03 % ophthalmic solution Place 1 drop into both eyes at bedtime.      . brimonidine (ALPHAGAN P) 0.1 % SOLN 3 drops daily.      . cilostazol (PLETAL) 100 MG tablet TAKE 1 TABLET ONCE DAILY.  90 tablet  1  . dorzolamide-timolol (COSOPT) 22.3-6.8 MG/ML ophthalmic solution Place 2 drops into the right eye 2 (two) times daily.      [name] doxazosin (CARDURA) 4 MG tablet TAKE (1/2) TABLET DAILY.  45 tablet  3  . MELATONIN PO Take 1 tablet by mouth at bedtime as needed.      [name] PLAVIX 75 MG tablet TAKE 1 TABLET EACH DAY.  90 tablet  0  . ramipril (ALTACE) 2.5 MG capsule Take 1 capsule (2.5 mg total) by mouth daily.  30 capsule  11  . rosuvastatin (CRESTOR) 20 MG tablet TAKE 1 TABLET ONCE DAILY.  90 tablet  3   No current facility-administered medications for this visit.    BP 134/58  Pulse 82  Ht 6\' 1"  (1.854 m)  Wt 200 lb (90.719 kg)  BMI 26.39 kg/m2 General: NAD Neck: No JVD, no thyromegaly or thyroid nodule.  Lungs: Mildly decreased breath sounds bilaterally.  CV: Nondisplaced PMI.  Heart regular S1/S2, no S3/S4, 1/6 SEM RUSB.  No peripheral edema.  Right carotid bruit. Right foot is warm but unable to palpate PT pulse.   Abdomen: Soft, nontender, no hepatosplenomegaly, no distention.   Neurologic: Alert and oriented x 3.  Psych: Normal affect. Extremities: No clubbing or cyanosis.   Assessment/Plan: 1. Chest pain: Patient has had atypical chest pain.  He has chronic exertional dyspnea and also has been feeling fatigued in general.  Given his significant risk for obstructive CAD, I think that it would be reasonable to do a stress test.  I will have him do a Lexiscan Sestamibi as his walking is limited by claudication.  I will arrange it for this week. Continue Plavix 75 mg daily.  2.  Hyperlipidemia: Check lipids fasting when he returns for stress test.  3. Carotid bruit: Right-sided.  I will arrange for carotid dopplers.   4. PAD: Stable ABIs and stable claudication.  No foot ulcerations or rest pain.  Followed at [facility]. 5. AAA: Had recent ultrasound with 3.4 cm AAA.  Followed at [facility].   6. COPD: Chronic stable exertional dyspnea.  I will get PFTs to see how significant his COPD is.  He is not on inhalers.  An inhaler regimen may help if his lung function is significantly impaired.   I again encouraged him to quit smoking.  7. Fatigue: Patient has felt more fatigued recently.  As above, getting PFTs and Lexiscan Sestamibi.  Lack of regular exercise probably plays a role.  I will get a CBC and TSH.   Marca Ancona 12/01/2012 4:43 PM

## 2012-12-08 ENCOUNTER — Other Ambulatory Visit (INDEPENDENT_AMBULATORY_CARE_PROVIDER_SITE_OTHER): Payer: BC Managed Care – PPO

## 2012-12-08 ENCOUNTER — Ambulatory Visit (HOSPITAL_COMMUNITY): Payer: BC Managed Care – PPO | Attending: Internal Medicine | Admitting: Radiology

## 2012-12-08 VITALS — BP 83/57 | HR 77 | Ht 73.5 in | Wt 193.0 lb

## 2012-12-08 DIAGNOSIS — R079 Chest pain, unspecified: Secondary | ICD-10-CM

## 2012-12-08 DIAGNOSIS — R0602 Shortness of breath: Secondary | ICD-10-CM

## 2012-12-08 DIAGNOSIS — R0989 Other specified symptoms and signs involving the circulatory and respiratory systems: Secondary | ICD-10-CM | POA: Insufficient documentation

## 2012-12-08 LAB — BASIC METABOLIC PANEL
BUN: 20 mg/dL (ref 6–23)
CO2: 24 mEq/L (ref 19–32)
Calcium: 9.6 mg/dL (ref 8.4–10.5)
Glucose, Bld: 119 mg/dL — ABNORMAL HIGH (ref 70–99)
Sodium: 144 mEq/L (ref 135–145)

## 2012-12-08 LAB — LIPID PANEL
HDL: 27.4 mg/dL — ABNORMAL LOW (ref >39.00)
Total CHOL/HDL Ratio: 5

## 2012-12-08 MED ORDER — AMINOPHYLLINE 25 MG/ML IV SOLN
75.0000 mg | Freq: Once | INTRAVENOUS | Status: AC
  Administered 2012-12-08: 75 mg via INTRAVENOUS

## 2012-12-08 MED ORDER — TECHNETIUM TC 99M SESTAMIBI GENERIC - CARDIOLITE
11.0000 | Freq: Once | INTRAVENOUS | Status: AC | PRN
  Administered 2012-12-08: 11 via INTRAVENOUS

## 2012-12-08 MED ORDER — REGADENOSON 0.4 MG/5ML IV SOLN
0.4000 mg | Freq: Once | INTRAVENOUS | Status: AC
  Administered 2012-12-08: 0.4 mg via INTRAVENOUS

## 2012-12-08 MED ORDER — TECHNETIUM TC 99M SESTAMIBI GENERIC - CARDIOLITE
33.0000 | Freq: Once | INTRAVENOUS | Status: AC | PRN
  Administered 2012-12-08: 33 via INTRAVENOUS

## 2012-12-08 NOTE — Progress Notes (Addendum)
[facility] SITE 3 NUCLEAR MED [address] [place], [place] [address] [phone]    Cardiology Nuclear Med Study  Troy [name] is a 72 y.o. male     MRN : [id number]     DOB: 1940-12-12  Procedure Date: 12/08/2012  Nuclear Med Background Indication for Stress Test:  Evaluation for Ischemia History:  '04 XLK:[id number], EF=58%; s/p Fem-Pop Bypass Cardiac Risk Factors: Claudication, Family History - CAD, Hypertension, Lipids, PVD/AAA and Smoker  Symptoms:  Chest Pain (last episode of chest discomfort was about 2-weeks ago), Dizziness, DOE/SOB and Fatigue    Nuclear Pre-Procedure Caffeine/Decaff Intake:  None > 12 hrs NPO After: 7:00pm   Lungs:  Clear. O2 Sat: 96% on room air. IV 0.9% NS with Angio Cath:  22g  IV Site: R Antecubital x 1, tolerated well IV Started by:  Irean Hong, RN  Chest Size (in):  44 Cup Size: n/a  Height: 6' 1.5" (1.867 m)  Weight:  193 lb (87.544 kg)  BMI:  Body mass index is 25.12 kg/(m^2). Tech Comments: Took medication this am    Nuclear Med Study 1 or 2 day study: 1 day  Stress Test Type:  Lexiscan  Reading MD: Dietrich Pates, MD  Order Authorizing Provider:  Marca Ancona, MD  Resting Radionuclide: Technetium 76m Sestamibi  Resting Radionuclide Dose: 11.0 mCi   Stress Radionuclide:  Technetium 73m Sestamibi  Stress Radionuclide Dose: 33.0 mCi           Stress Protocol Rest HR: 77 Stress HR: 83  Rest BP: Sitting 83/57  Trendelenburg  107/68 Stress BP: 112/62  Exercise Time (min): n/a METS: n/a   Predicted Max HR: 148 bpm % Max HR: 56.08 bpm Rate Pressure Product: 9296   Dose of Adenosine (mg):  n/a Dose of Lexiscan: 0.4 mg Dose of Aminophylline:75 mg  Dose of Atropine (mg): n/a Dose of Dobutamine: n/a mcg/kg/min (at max HR)  Stress Test Technologist: Smiley Houseman, CMA-N  Nuclear Technologist:  Domenic Polite, CNMT     Rest Procedure:  Myocardial perfusion imaging was performed at rest 45 minutes following the intravenous  administration of Technetium 8m Sestamibi.  Rest ECG: NSR with occasional PAC.  Stress Procedure:  The patient received IV Lexiscan 0.4 mg over 15-seconds.  Technetium 26m Sestamibi injected at 30-seconds.  Quantitative spect images were obtained after a 45 minute delay.  Stress ECG: No significant ST segment change suggestive of ischemia.  QPS Raw Data Images:  Soft tissue (diaphragm, bowel activity) underlie heart. Stress Images:  Normal homogeneous uptake in all areas of the myocardium. Rest Images:  Normal homogeneous uptake in all areas of the myocardium. Subtraction (SDS):  No evidence of ischemia. Transient Ischemic Dilatation (Normal <1.22):  1.09 Lung/Heart Ratio (Normal <0.45):  0.36  Quantitative Gated Spect Images QGS EDV:  65 ml QGS ESV:  22 ml  Impression Exercise Capacity:  Lexiscan with no exercise. BP Response:  Normal blood pressure response. Clinical Symptoms:  No chest pain. ECG Impression:  No significant ST segment change suggestive of ischemia. Comparison with Prior Nuclear Study: No significant change from previous study  Overall Impression:  Normal stress nuclear study.  LV Ejection Fraction: 66%.  LV Wall Motion:  NL LV Function; NL Wall Motion  Dietrich Pates  Stress test was normal.  Please inform patient, no evidence for a significant blockage in heart arteries causing symptoms.   Marca Ancona 12/09/2012

## 2012-12-10 ENCOUNTER — Other Ambulatory Visit: Payer: Self-pay | Admitting: *Deleted

## 2012-12-10 DIAGNOSIS — R0602 Shortness of breath: Secondary | ICD-10-CM

## 2012-12-10 MED ORDER — FUROSEMIDE 20 MG PO TABS: 20.0000 mg | ORAL_TABLET | Freq: Every day | ORAL | Status: DC

## 2012-12-10 MED ORDER — POTASSIUM CHLORIDE ER 10 MEQ PO TBCR: 10.0000 meq | EXTENDED_RELEASE_TABLET | Freq: Every day | ORAL | Status: DC

## 2012-12-10 MED ORDER — ROSUVASTATIN CALCIUM 40 MG PO TABS
40.0000 mg | ORAL_TABLET | Freq: Every day | ORAL | Status: DC
Start: 2012-12-10 — End: 2013-01-19

## 2012-12-10 NOTE — Progress Notes (Signed)
LMTCB

## 2012-12-10 NOTE — Progress Notes (Signed)
Dr Shirlee Latch spoke with pt.

## 2012-12-19 ENCOUNTER — Other Ambulatory Visit: Payer: Self-pay | Admitting: Internal Medicine

## 2012-12-27 ENCOUNTER — Encounter: Payer: Self-pay | Admitting: Cardiology

## 2012-12-29 ENCOUNTER — Ambulatory Visit (HOSPITAL_COMMUNITY): Payer: BC Managed Care – PPO | Attending: Cardiology | Admitting: Radiology

## 2012-12-29 ENCOUNTER — Encounter (INDEPENDENT_AMBULATORY_CARE_PROVIDER_SITE_OTHER): Payer: BC Managed Care – PPO

## 2012-12-29 DIAGNOSIS — R0602 Shortness of breath: Secondary | ICD-10-CM | POA: Insufficient documentation

## 2012-12-29 DIAGNOSIS — I739 Peripheral vascular disease, unspecified: Secondary | ICD-10-CM | POA: Insufficient documentation

## 2012-12-29 DIAGNOSIS — R0989 Other specified symptoms and signs involving the circulatory and respiratory systems: Secondary | ICD-10-CM

## 2012-12-29 DIAGNOSIS — E785 Hyperlipidemia, unspecified: Secondary | ICD-10-CM | POA: Insufficient documentation

## 2012-12-29 DIAGNOSIS — J449 Chronic obstructive pulmonary disease, unspecified: Secondary | ICD-10-CM | POA: Insufficient documentation

## 2012-12-29 DIAGNOSIS — I6529 Occlusion and stenosis of unspecified carotid artery: Secondary | ICD-10-CM

## 2012-12-29 DIAGNOSIS — C3490 Malignant neoplasm of unspecified part of unspecified bronchus or lung: Secondary | ICD-10-CM | POA: Insufficient documentation

## 2012-12-29 DIAGNOSIS — F17200 Nicotine dependence, unspecified, uncomplicated: Secondary | ICD-10-CM | POA: Insufficient documentation

## 2012-12-29 DIAGNOSIS — J4489 Other specified chronic obstructive pulmonary disease: Secondary | ICD-10-CM | POA: Insufficient documentation

## 2012-12-29 DIAGNOSIS — R079 Chest pain, unspecified: Secondary | ICD-10-CM

## 2012-12-29 NOTE — Progress Notes (Signed)
Echocardiogram performed.  

## 2013-01-05 ENCOUNTER — Ambulatory Visit (INDEPENDENT_AMBULATORY_CARE_PROVIDER_SITE_OTHER): Payer: BC Managed Care – PPO | Admitting: Internal Medicine

## 2013-01-05 ENCOUNTER — Other Ambulatory Visit: Payer: Self-pay | Admitting: Urology

## 2013-01-05 DIAGNOSIS — R0602 Shortness of breath: Secondary | ICD-10-CM

## 2013-01-05 LAB — PULMONARY FUNCTION TEST

## 2013-01-05 NOTE — Progress Notes (Signed)
PFT done today. 

## 2013-01-19 ENCOUNTER — Encounter (HOSPITAL_BASED_OUTPATIENT_CLINIC_OR_DEPARTMENT_OTHER): Payer: Self-pay | Admitting: *Deleted

## 2013-01-19 NOTE — Progress Notes (Signed)
NPO AFTER MN. ARRIVES AT 0715. NEEDS ISTAT. CURRENT CXR AND EKG IN EPIC AND CHART.

## 2013-01-20 NOTE — Anesthesia Preprocedure Evaluation (Addendum)
Anesthesia Evaluation  Patient identified by MRN, date of birth, ID band Patient awake    Reviewed: Allergy & Precautions, H&P , NPO status , Patient's Chart, lab work & pertinent test results  Airway Mallampati: II TM Distance: >3 FB Neck ROM: full    Dental no notable dental hx. (+) Teeth Intact and Dental Advisory Given   Pulmonary neg pulmonary ROS, shortness of breath and with exertion, COPDCurrent Smoker,  Mild to moderate COPD breath sounds clear to auscultation  Pulmonary exam normal       Cardiovascular Exercise Tolerance: Good hypertension, Pt. on medications + Peripheral Vascular Disease negative cardio ROS  Rhythm:regular Rate:Normal  Aortic aneurysm.  PVD with angioplasty and stenting   Neuro/Psych Bilateral ICA stenosis. glaucoma negative neurological ROS  negative psych ROS   GI/Hepatic negative GI ROS, Neg liver ROS, GERD-  Medicated and Controlled,  Endo/Other  negative endocrine ROS  Renal/GU negative Renal ROS  negative genitourinary   Musculoskeletal   Abdominal   Peds  Hematology negative hematology ROS (+)   Anesthesia Other Findings   Reproductive/Obstetrics negative OB ROS                          Anesthesia Physical Anesthesia Plan  ASA: III  Anesthesia Plan: General   Post-op Pain Management:    Induction: Intravenous  Airway Management Planned: LMA  Additional Equipment:   Intra-op Plan:   Post-operative Plan:   Informed Consent: I have reviewed the patients History and Physical, chart, labs and discussed the procedure including the risks, benefits and alternatives for the proposed anesthesia with the patient or authorized representative who has indicated his/her understanding and acceptance.   Dental Advisory Given  Plan Discussed with: CRNA and Surgeon  Anesthesia Plan Comments:         Anesthesia Quick Evaluation

## 2013-01-21 ENCOUNTER — Encounter (HOSPITAL_BASED_OUTPATIENT_CLINIC_OR_DEPARTMENT_OTHER)
Admission: RE | Disposition: A | Discharge status: Home / Self Care | Payer: Self-pay | Source: Ambulatory Visit | Attending: Urology

## 2013-01-21 ENCOUNTER — Ambulatory Visit (HOSPITAL_BASED_OUTPATIENT_CLINIC_OR_DEPARTMENT_OTHER): Payer: BC Managed Care – PPO | Admitting: Anesthesiology

## 2013-01-21 ENCOUNTER — Encounter (HOSPITAL_BASED_OUTPATIENT_CLINIC_OR_DEPARTMENT_OTHER): Payer: Self-pay | Admitting: *Deleted

## 2013-01-21 ENCOUNTER — Ambulatory Visit (HOSPITAL_BASED_OUTPATIENT_CLINIC_OR_DEPARTMENT_OTHER)
Admission: RE | Admit: 2013-01-21 | Discharge: 2013-01-21 | Disposition: A | Discharge status: Home / Self Care | Payer: BC Managed Care – PPO | Source: Ambulatory Visit | Attending: Urology | Admitting: Urology

## 2013-01-21 ENCOUNTER — Ambulatory Visit (HOSPITAL_COMMUNITY): Payer: BC Managed Care – PPO

## 2013-01-21 ENCOUNTER — Encounter (HOSPITAL_BASED_OUTPATIENT_CLINIC_OR_DEPARTMENT_OTHER): Payer: Self-pay | Admitting: Anesthesiology

## 2013-01-21 DIAGNOSIS — I714 Abdominal aortic aneurysm, without rupture, unspecified: Secondary | ICD-10-CM | POA: Insufficient documentation

## 2013-01-21 DIAGNOSIS — F17200 Nicotine dependence, unspecified, uncomplicated: Secondary | ICD-10-CM | POA: Insufficient documentation

## 2013-01-21 DIAGNOSIS — R3915 Urgency of urination: Secondary | ICD-10-CM | POA: Insufficient documentation

## 2013-01-21 DIAGNOSIS — N329 Bladder disorder, unspecified: Secondary | ICD-10-CM

## 2013-01-21 DIAGNOSIS — N400 Enlarged prostate without lower urinary tract symptoms: Secondary | ICD-10-CM | POA: Insufficient documentation

## 2013-01-21 DIAGNOSIS — R350 Frequency of micturition: Secondary | ICD-10-CM | POA: Insufficient documentation

## 2013-01-21 DIAGNOSIS — Z79899 Other long term (current) drug therapy: Secondary | ICD-10-CM | POA: Insufficient documentation

## 2013-01-21 DIAGNOSIS — J449 Chronic obstructive pulmonary disease, unspecified: Secondary | ICD-10-CM | POA: Insufficient documentation

## 2013-01-21 DIAGNOSIS — J4489 Other specified chronic obstructive pulmonary disease: Secondary | ICD-10-CM | POA: Insufficient documentation

## 2013-01-21 DIAGNOSIS — M129 Arthropathy, unspecified: Secondary | ICD-10-CM | POA: Insufficient documentation

## 2013-01-21 DIAGNOSIS — Z85118 Personal history of other malignant neoplasm of bronchus and lung: Secondary | ICD-10-CM | POA: Insufficient documentation

## 2013-01-21 DIAGNOSIS — E785 Hyperlipidemia, unspecified: Secondary | ICD-10-CM | POA: Insufficient documentation

## 2013-01-21 DIAGNOSIS — Z888 Allergy status to other drugs, medicaments and biological substances status: Secondary | ICD-10-CM | POA: Insufficient documentation

## 2013-01-21 DIAGNOSIS — Z882 Allergy status to sulfonamides status: Secondary | ICD-10-CM | POA: Insufficient documentation

## 2013-01-21 DIAGNOSIS — Z7902 Long term (current) use of antithrombotics/antiplatelets: Secondary | ICD-10-CM | POA: Insufficient documentation

## 2013-01-21 DIAGNOSIS — I739 Peripheral vascular disease, unspecified: Secondary | ICD-10-CM | POA: Insufficient documentation

## 2013-01-21 DIAGNOSIS — N3090 Cystitis, unspecified without hematuria: Secondary | ICD-10-CM | POA: Insufficient documentation

## 2013-01-21 DIAGNOSIS — N35919 Unspecified urethral stricture, male, unspecified site: Secondary | ICD-10-CM | POA: Insufficient documentation

## 2013-01-21 DIAGNOSIS — R351 Nocturia: Secondary | ICD-10-CM | POA: Insufficient documentation

## 2013-01-21 DIAGNOSIS — Z85828 Personal history of other malignant neoplasm of skin: Secondary | ICD-10-CM | POA: Insufficient documentation

## 2013-01-21 DIAGNOSIS — K219 Gastro-esophageal reflux disease without esophagitis: Secondary | ICD-10-CM | POA: Insufficient documentation

## 2013-01-21 DIAGNOSIS — I658 Occlusion and stenosis of other precerebral arteries: Secondary | ICD-10-CM | POA: Insufficient documentation

## 2013-01-21 DIAGNOSIS — I10 Essential (primary) hypertension: Secondary | ICD-10-CM | POA: Insufficient documentation

## 2013-01-21 DIAGNOSIS — Z880 Allergy status to penicillin: Secondary | ICD-10-CM | POA: Insufficient documentation

## 2013-01-21 DIAGNOSIS — I6529 Occlusion and stenosis of unspecified carotid artery: Secondary | ICD-10-CM | POA: Insufficient documentation

## 2013-01-21 HISTORY — DX: Benign prostatic hyperplasia without lower urinary tract symptoms: N40.0

## 2013-01-21 HISTORY — DX: Peripheral vascular disease, unspecified: I73.9

## 2013-01-21 HISTORY — DX: Abdominal aortic aneurysm, without rupture, unspecified: I71.40

## 2013-01-21 HISTORY — DX: Personal history of other malignant neoplasm of bronchus and lung: Z85.118

## 2013-01-21 HISTORY — DX: Unspecified osteoarthritis, unspecified site: M19.90

## 2013-01-21 HISTORY — DX: Gastro-esophageal reflux disease without esophagitis: K21.9

## 2013-01-21 HISTORY — PX: CYSTOSCOPY W/ RETROGRADES: SHX1426

## 2013-01-21 HISTORY — DX: Other specified postprocedural states: Z85.828

## 2013-01-21 HISTORY — DX: Occlusion and stenosis of bilateral carotid arteries: I65.23

## 2013-01-21 HISTORY — DX: Abdominal aortic aneurysm, without rupture: I71.4

## 2013-01-21 HISTORY — DX: Bladder disorder, unspecified: N32.9

## 2013-01-21 HISTORY — DX: Other specified postprocedural states: Z98.890

## 2013-01-21 HISTORY — PX: CYSTOSCOPY WITH BIOPSY: SHX5122

## 2013-01-21 HISTORY — DX: Urgency of urination: R39.15

## 2013-01-21 HISTORY — DX: Other microscopic hematuria: R31.29

## 2013-01-21 LAB — POCT I-STAT 4, (NA,K, GLUC, HGB,HCT)
Glucose, Bld: 111 mg/dL — ABNORMAL HIGH (ref 70–99)
HCT: 40 % (ref 39.0–52.0)
Potassium: 4.1 mEq/L (ref 3.5–5.1)
Sodium: 141 mEq/L (ref 135–145)

## 2013-01-21 SURGERY — CYSTOSCOPY, WITH RETROGRADE PYELOGRAM
Anesthesia: General | Site: Bladder | Wound class: Clean Contaminated

## 2013-01-21 MED ORDER — LIDOCAINE HCL (CARDIAC) 20 MG/ML IV SOLN
INTRAVENOUS | Status: DC | PRN
  Administered 2013-01-21: 50 mg via INTRAVENOUS

## 2013-01-21 MED ORDER — FENTANYL CITRATE 0.05 MG/ML IJ SOLN
25.0000 ug | INTRAMUSCULAR | Status: DC | PRN
  Filled 2013-01-21: qty 1

## 2013-01-21 MED ORDER — LACTATED RINGERS IV SOLN
INTRAVENOUS | Status: DC
  Administered 2013-01-21: 08:00:00 via INTRAVENOUS
  Filled 2013-01-21: qty 1000

## 2013-01-21 MED ORDER — STERILE WATER FOR IRRIGATION IR SOLN
Status: DC | PRN
  Administered 2013-01-21: 3000 mL

## 2013-01-21 MED ORDER — IOHEXOL 350 MG/ML SOLN
INTRAVENOUS | Status: DC | PRN
  Administered 2013-01-21: 10 mL

## 2013-01-21 MED ORDER — PROPOFOL 10 MG/ML IV BOLUS
INTRAVENOUS | Status: DC | PRN
  Administered 2013-01-21: 180 mg via INTRAVENOUS

## 2013-01-21 MED ORDER — SENNOSIDES-DOCUSATE SODIUM 8.6-50 MG PO TABS: 1.0000 | ORAL_TABLET | Freq: Two times a day (BID) | ORAL | Status: DC

## 2013-01-21 MED ORDER — CIPROFLOXACIN HCL 500 MG PO TABS: 500.0000 mg | ORAL_TABLET | Freq: Two times a day (BID) | ORAL | Status: DC

## 2013-01-21 MED ORDER — LIDOCAINE HCL 2 % EX GEL
CUTANEOUS | Status: DC | PRN
  Administered 2013-01-21: 1

## 2013-01-21 MED ORDER — ONDANSETRON HCL 4 MG/2ML IJ SOLN
INTRAMUSCULAR | Status: DC | PRN
  Administered 2013-01-21: 4 mg via INTRAVENOUS

## 2013-01-21 MED ORDER — LACTATED RINGERS IV SOLN
INTRAVENOUS | Status: DC
  Filled 2013-01-21: qty 1000

## 2013-01-21 MED ORDER — DEXAMETHASONE SODIUM PHOSPHATE 4 MG/ML IJ SOLN
INTRAMUSCULAR | Status: DC | PRN
  Administered 2013-01-21: 8 mg via INTRAVENOUS

## 2013-01-21 MED ORDER — PHENAZOPYRIDINE HCL 100 MG PO TABS
100.0000 mg | ORAL_TABLET | Freq: Three times a day (TID) | ORAL | Status: AC
  Administered 2013-01-21: 100 mg via ORAL
  Filled 2013-01-21: qty 1

## 2013-01-21 MED ORDER — CIPROFLOXACIN IN D5W 400 MG/200ML IV SOLN
400.0000 mg | INTRAVENOUS | Status: AC
  Administered 2013-01-21: 400 mg via INTRAVENOUS
  Filled 2013-01-21: qty 200

## 2013-01-21 MED ORDER — FENTANYL CITRATE 0.05 MG/ML IJ SOLN
INTRAMUSCULAR | Status: DC | PRN
  Administered 2013-01-21 (×2): 25 ug via INTRAVENOUS
  Administered 2013-01-21: 50 ug via INTRAVENOUS

## 2013-01-21 MED ORDER — HYDROCODONE-ACETAMINOPHEN 5-325 MG PO TABS: 1.0000 | ORAL_TABLET | ORAL | Status: DC | PRN

## 2013-01-21 MED ORDER — BELLADONNA ALKALOIDS-OPIUM 16.2-60 MG RE SUPP
RECTAL | Status: DC | PRN
  Administered 2013-01-21: 1 via RECTAL

## 2013-01-21 MED ORDER — MIDAZOLAM HCL 5 MG/5ML IJ SOLN
INTRAMUSCULAR | Status: DC | PRN
  Administered 2013-01-21: 2 mg via INTRAVENOUS

## 2013-01-21 MED ORDER — HYOSCYAMINE SULFATE 0.125 MG PO TABS: 0.1250 mg | ORAL_TABLET | ORAL | Status: DC | PRN

## 2013-01-21 MED ORDER — PHENAZOPYRIDINE HCL 100 MG PO TABS: 100.0000 mg | ORAL_TABLET | Freq: Three times a day (TID) | ORAL | Status: DC | PRN

## 2013-01-21 SURGICAL SUPPLY — 38 items
ADAPTER CATH URET PLST 4-6FR (CATHETERS) IMPLANT
ADPR CATH URET STRL DISP 4-6FR (CATHETERS)
BAG DRAIN URO-CYSTO SKYTR STRL (DRAIN) ×3 IMPLANT
BAG DRN UROCATH (DRAIN) ×2
BASKET LASER NITINOL 1.9FR (BASKET) IMPLANT
BASKET STNLS GEMINI 4WIRE 3FR (BASKET) IMPLANT
BASKET ZERO TIP NITINOL 2.4FR (BASKET) IMPLANT
BRUSH URET BIOPSY 3F (UROLOGICAL SUPPLIES) IMPLANT
BSKT STON RTRVL 120 1.9FR (BASKET)
BSKT STON RTRVL GEM 120X11 3FR (BASKET)
BSKT STON RTRVL ZERO TP 2.4FR (BASKET)
CANISTER SUCT LVC 12 LTR MEDI- (MISCELLANEOUS) ×1 IMPLANT
CATH INTERMIT  6FR 70CM (CATHETERS) IMPLANT
CATH URET 5FR 28IN CONE TIP (BALLOONS)
CATH URET 5FR 28IN OPEN ENDED (CATHETERS) IMPLANT
CATH URET 5FR 70CM CONE TIP (BALLOONS) IMPLANT
CLOTH BEACON ORANGE TIMEOUT ST (SAFETY) ×3 IMPLANT
DRAPE CAMERA CLOSED 9X96 (DRAPES) ×3 IMPLANT
ELECT REM PT RETURN 9FT ADLT (ELECTROSURGICAL)
ELECTRODE REM PT RTRN 9FT ADLT (ELECTROSURGICAL) IMPLANT
GLOVE BIO SURGEON STRL SZ7 (GLOVE) ×4 IMPLANT
GLOVE ECLIPSE 6.0 STRL STRAW (GLOVE) ×1 IMPLANT
GLOVE INDICATOR 7.0 STRL GRN (GLOVE) ×1 IMPLANT
GLOVE INDICATOR 7.5 STRL GRN (GLOVE) IMPLANT
GOWN PREVENTION PLUS LG XLONG (DISPOSABLE) ×3 IMPLANT
GUIDEWIRE 0.038 PTFE COATED (WIRE) IMPLANT
GUIDEWIRE ANG ZIPWIRE 038X150 (WIRE) IMPLANT
GUIDEWIRE STR DUAL SENSOR (WIRE) ×3 IMPLANT
IV NS IRRIG 3000ML ARTHROMATIC (IV SOLUTION) ×3 IMPLANT
KIT BALLIN UROMAX 15FX10 (LABEL) IMPLANT
KIT BALLN UROMAX 15FX4 (MISCELLANEOUS) IMPLANT
KIT BALLN UROMAX 26 75X4 (MISCELLANEOUS)
LASER FIBER DISP (UROLOGICAL SUPPLIES) IMPLANT
PACK CYSTOSCOPY (CUSTOM PROCEDURE TRAY) ×3 IMPLANT
SET HIGH PRES BAL DIL (LABEL)
SHEATH URET ACCESS 12FR/35CM (UROLOGICAL SUPPLIES) IMPLANT
SHEATH URET ACCESS 12FR/55CM (UROLOGICAL SUPPLIES) IMPLANT
SYRINGE IRR TOOMEY STRL 70CC (SYRINGE) IMPLANT

## 2013-01-21 NOTE — H&P (Signed)
Urology History and Physical Exam  CC: Bladder lesion  HPI: 72 year old male presents today for cystoscopy, bladder biopsy, and bilateral retrograde pyelograms. He has a history of microscopic hematuria and is a long-standing smoker. He has refused cystoscopy, but agreed to cystoscopy on 12/26/12. At that time a bladder lesion was discovered. It was located on the posterior bladder wall. It was approximately once the meter in size. It was sessile in nature. There were no aggravating or alleviating factors. He presents today for the above-mentioned procedure. We have discussed the risks, benefits, alternatives, and likelihood of achieving goals. He understands that he may have to be discharged home with a catheter in place. We have also discussed the possible need to perform ureteroscopy with biopsy and stent placement and its associated risks, benefits, and complications. He was cleared by his primary doctor, Dr. Alwyn Ren, and his cardiologist, Dr. Shirlee Latch, to be off Plavix and Pletal 5 days before surgery. He states that he has discontinued these medicines accordingly. UA on 12/18/12 was negative for signs of infection.  PMH: Past Medical History  Diagnosis Date  . Hypertension   . Hyperlipidemia   . Glaucoma(365) BOTH EYES    Dr Nile Riggs  . Hx of adenomatous colonic polyps 2005     X 2; 1 hyperplastic polyp; Dr Juanda Chance  . Impaired fasting glucose 2007    108; A1c5.4%  . COPD (chronic obstructive pulmonary disease)   . History of lung cancer APRIL 2012  SQUAMOUS CELL---- S/P RIGHT LOWER LOBECTOMY AT [facility] --  NO CHEMORADIATION---  NO RECURRENCE     ONCOLOGIST- DR Mariel Sleet  LOV IN [facility] 10-27-2012  . GERD (gastroesophageal reflux disease)   . BPH (benign prostatic hypertrophy)   . Frequency of urination   . Urgency of urination   . Nocturia   . Lesion of bladder   . Microhematuria   . Arthritis   . History of basal cell carcinoma excision   . Exertional dyspnea   . Peripheral vascular disease  S/P ANGIOPLASTY AND STENTING    FOLLOWED  BY DR Edilia Bo  . PAD (peripheral artery disease) ABI'S  JAN 2014  0.65 ON RIGHT ;  1.04 ON LEFT  . AAA (abdominal aortic aneurysm) LAST ABD. [name]  JAN 2014  3.4CM    MONITORED BY DR Edilia Bo  . Bilateral carotid artery stenosis DUPLEX 12-29-2012  BY DR [facility]    BILATERAL ICA STENOSIS 60-79%    PSH: Past Surgical History  Procedure Laterality Date  . Angio plasty      X 4 in legs  . Colon polyectomy    . Trabecular surgery      OS  . Lung lobectomy  01/24/2011     RIGHT UPPER LOBE  (SQUAMOUS CELL CARCINOMA) Dr Desmond Lope , [facility]. No chemotherapyor radiation  . Laryngoscopy  06-27-2004    BX VOCAL CORD  (LEUKOPLAKIA)  PER PT NO ISSUES SINCE  . Cataract extraction w/ intraocular lens  implant, bilateral    . Basal cell carcinoma excision      MULTIPLE TIMES--  RIGHT FOREARM, CHEEKS, AND BACK  . Femoral-popliteal bypass graft Left 1994  [facility]    AND 2001  IN [place]  . Cardiovascular stress test  12-08-2012  DR [facility]    NORMAL LEXISCAN WITH NO EXERCISE NUCLEAR STUDY/ EF 66%/   NO ISCHEMIA/ NO SIGNIFICANT CHANGE FROM PRIOR STUDY  . Transthoracic echocardiogram  12-29-2012  DR [facility]    MILD LVH/  LVSF NORMAL/ EF 55-60%/  GRADE I DIASTOLIC DYSFUNCTION  . Aortogram  07-27-2002    MILD DIFFUSE ILIAC ARTERY OCCLUSIVE DISEASE /  LEFT RENAL ARTERY 20%/ PATENT LEFT FEM-POP GRAFT/ MILD SFA AND POPLITEAL ARTERY OCCLUSIVE DISEASE W/ SEVERE KIDNEY OCCLUSIVE DISEASE    Allergies: Allergies  Allergen Reactions  . Penicillins Shortness Of Breath    Flushing   . Sulfonamide Derivatives Shortness Of Breath    Flushing  . Niacin-Lovastatin Er Other (See Comments)    Dyspnea, flushing  . Atorvastatin Other (See Comments)    Myalgias & athralgias    Medications: Prescriptions prior to admission  Medication Sig Dispense Refill  . bimatoprost (LUMIGAN) 0.03 % ophthalmic solution Place 1 drop into both eyes at bedtime.      . brimonidine (ALPHAGAN P)  0.1 % SOLN Place 3 drops into both ears 3 (three) times daily.       . cilostazol (PLETAL) 100 MG tablet       . clopidogrel (PLAVIX) 75 MG tablet       . dorzolamide-timolol (COSOPT) 22.3-6.8 MG/ML ophthalmic solution Place 2 drops into the right eye 2 (two) times daily.      [name] doxazosin (CARDURA) 4 MG tablet Take 2 mg by mouth every morning. TAKE (1/2) TABLET DAILY.      [name] MELATONIN PO Take 1 tablet by mouth at bedtime as needed.      . Multiple Vitamins-Minerals (CENTRUM SILVER PO) Take 1 tablet by mouth daily.      . potassium chloride (K-DUR) 10 MEQ tablet Take 1 tablet (10 mEq total) by mouth daily.  30 tablet  6  . ramipril (ALTACE) 2.5 MG tablet Take 2.5 mg by mouth every morning.      . rosuvastatin (CRESTOR) 40 MG tablet Take 20 mg by mouth every morning.      . ramipril (ALTACE) 2.5 MG capsule Take 1 capsule (2.5 mg total) by mouth daily.  30 capsule  11     Social History: History   Social History  . Marital Status: Married    Spouse Name: N/A    Number of Children: N/A  . Years of Education: N/A   Occupational History  . Not on file.   Social History Main Topics  . Smoking status: Current Every Day Smoker -- 0.50 packs/day for 50 years    Types: Cigarettes  . Smokeless tobacco: Never Used  . Alcohol Use: Yes     Comment:  socially, variable  . Drug Use: No  . Sexually Active: Not on file   Other Topics Concern  . Not on file   Social History Narrative  . No narrative on file    Family History: Family History  Problem Relation Age of Onset  . Stroke Mother     mini strokes  . Alcohol abuse Father   . Heart disease Father     MI after 9  . Stroke Father   . Hypertension Father   . Heart attack Father   . Heart disease Paternal Aunt     several  . Hypertension Paternal Aunt     several  . Stroke Paternal Aunt     several  . Stroke Paternal Uncle     several  . Heart disease Paternal Uncle     several;2 had MI pre 68    Review of  Systems: Positive: None Negative: Fever, gross hematuria, or chest pain.  A further 10 point review of systems was negative except what is listed in the HPI.  Physical Exam:  General: No acute distress.  Awake. Head:  Normocephalic.  Atraumatic. ENT:  EOMI.  Mucous membranes moist Neck:  Supple.  No lymphadenopathy. CV:  S1 present. S2 present. Regular rate. Pulmonary: Equal effort bilaterally.  Clear to auscultation bilaterally. Abdomen: Soft.  Non- tender to palpation. Skin:  Normal turgor.  No visible rash. Extremity: No gross deformity of bilateral upper extremities.  No gross deformity of    bilateral lower extremities. Neurologic: Alert. Appropriate mood.    Studies:  No results found for this basename: HGB, WBC, PLT,  in the last 72 hours  No results found for this basename: NA, K, CL, CO2, BUN, CREATININE, CALCIUM, MAGNESIUM, GFRNONAA, GFRAA,  in the last 72 hours   No results found for this basename: PT, INR, APTT,  in the last 72 hours   No components found with this basename: ABG,     Assessment:  Bladder lesion  Plan: To OR for  for cystoscopy, bladder biopsy, and bilateral retrograde pyelograms.

## 2013-01-21 NOTE — Op Note (Signed)
Urology Operative Report  Date of Procedure: 01/21/13  Surgeon: Natalia Leatherwood, MD Assistant: None  Preoperative Diagnosis: Bladder lesion Postoperative Diagnosis:  Same. Bulbar urethral stricture  Procedure(s): Cystoscopy Bladder biopsy Bilateral retrograde pyelograms  Estimated blood loss: Minimal  Specimen: Bladder biopsy (posterior bladder, left lateral bladder).  Drains: None  Complications: None  Findings: Bulbar urethral stricture (mild) Two areas of erythema in bladder (posterior bladder, left lateral bladder). Negative filling defects on retrograde pyelograms  History of present illness: Troy [name] resents today for bladder lesions discovered during workup for microscopic hematuria. Patient has a history of smoking.   Procedure in detail: After informed consent was obtained, the patient was taken to the operating room. They were placed in the supine position. SCDs were turned on and in place. IV antibiotics were infused, and general anesthesia was induced. A timeout was performed in which the correct patient, surgical site, and procedure were identified and agreed upon by the team. A belladonna and opium suppository was placed into the rectum.  The patient was placed in a dorsolithotomy position, making sure to pad all pertinent neurovascular pressure points. The genitals were prepped and draped in the usual sterile fashion. A rigid cystoscope was advanced through the urethra. In the bulbar urethra a mild urethral stricture was encountered which was able to be traversed with the cystoscope. The bladder was drained, and then fully distended with sterile fluid and evaluated in a systematic fashion to examine the entire surface of the bladder with a 12 and 70 lens. There were 2 erythematous areas on the posterior bladder wall and left lateral bladder wall. Cold cup biopsy forcep was used to biopsy each of the sites and they were sent for pathology. Bugbee electrode  was then used to fulgurate the sites to maintain hemostasis.  A 5 French ureter catheter was used to cannulate the ureter orifices bilaterally. I injected contrast to obtain retrograde pyelograms. There was no hydronephrosis or filling defects bilaterally. Each side indeed out well. The bladder was drained and the scope was withdrawn. There was no mucosal disruption in the urethra where the stricture was located. I injected 10 cc of lidocaine jelly into the patient's urethra. He was placed back in a supine position and anesthesia was reversed. He was taken to the PACU in stable condition.  He will followup with me on Monday to review pathology results. He and his wife were instructed to restart Plavix and Pletal today.

## 2013-01-21 NOTE — Anesthesia Procedure Notes (Signed)
Procedure Name: LMA Insertion Date/Time: 01/21/2013 8:37 AM Performed by: Renella Cunas D Pre-anesthesia Checklist: Patient identified, Emergency Drugs available, Suction available and Patient being monitored Patient Re-evaluated:Patient Re-evaluated prior to inductionOxygen Delivery Method: Circle System Utilized Preoxygenation: Pre-oxygenation with 100% oxygen Intubation Type: IV induction Ventilation: Mask ventilation without difficulty LMA: LMA inserted LMA Size: 4.0 Number of attempts: 1 Airway Equipment and Method: bite block Placement Confirmation: positive ETCO2 Tube secured with: Tape Dental Injury: Teeth and Oropharynx as per pre-operative assessment

## 2013-01-21 NOTE — Anesthesia Postprocedure Evaluation (Signed)
  Anesthesia Post-op Note  Patient: [name]  Procedure(s) Performed: Procedure(s) (LRB): CYSTOSCOPY WITH RETROGRADE PYELOGRAM (Bilateral) CYSTOSCOPY WITH BIOPSY (N/A)  Patient Location: PACU  Anesthesia Type: General  Level of Consciousness: awake and alert   Airway and Oxygen Therapy: Patient Spontanous Breathing  Post-op Pain: mild  Post-op Assessment: Post-op Vital signs reviewed, Patient's Cardiovascular Status Stable, Respiratory Function Stable, Patent Airway and No signs of Nausea or vomiting  Last Vitals:  Filed Vitals:   01/21/13 0930  BP: 130/70  Pulse: 66  Temp:   Resp: 15    Post-op Vital Signs: stable   Complications: No apparent anesthesia complications

## 2013-01-21 NOTE — Transfer of Care (Signed)
Immediate Anesthesia Transfer of Care Note  Patient: Troy [name]  Procedure(s) Performed: Procedure(s) (LRB): CYSTOSCOPY WITH RETROGRADE PYELOGRAM (Bilateral) CYSTOSCOPY WITH BIOPSY (N/A)  Patient Location: PACU  Anesthesia Type: General  Level of Consciousness: awake, oriented, sedated and patient cooperative  Airway & Oxygen Therapy: Patient Spontanous Breathing and Patient connected to face mask oxygen  Post-op Assessment: Report given to PACU RN and Post -op Vital signs reviewed and stable  Post vital signs: Reviewed and stable  Complications: No apparent anesthesia complications

## 2013-01-22 ENCOUNTER — Encounter (HOSPITAL_BASED_OUTPATIENT_CLINIC_OR_DEPARTMENT_OTHER): Payer: Self-pay | Admitting: Urology

## 2013-02-11 ENCOUNTER — Other Ambulatory Visit: Payer: BC Managed Care – PPO

## 2013-02-12 ENCOUNTER — Encounter: Payer: Self-pay | Admitting: Cardiology

## 2013-04-27 ENCOUNTER — Ambulatory Visit (HOSPITAL_COMMUNITY): Payer: BC Managed Care – PPO

## 2013-05-27 ENCOUNTER — Ambulatory Visit: Payer: BC Managed Care – PPO | Admitting: Cardiology

## 2013-06-02 ENCOUNTER — Other Ambulatory Visit: Payer: Self-pay | Admitting: Internal Medicine

## 2013-06-03 NOTE — Telephone Encounter (Signed)
Med filled. Called pt and LMOVM to schedule appt.

## 2013-07-06 ENCOUNTER — Observation Stay (HOSPITAL_COMMUNITY)
Admission: EM | Admit: 2013-07-06 | Discharge: 2013-07-07 | Disposition: A | Discharge status: Home / Self Care | Payer: BC Managed Care – PPO | Attending: Family Medicine | Admitting: Family Medicine

## 2013-07-06 ENCOUNTER — Encounter (HOSPITAL_COMMUNITY): Payer: Self-pay | Admitting: *Deleted

## 2013-07-06 ENCOUNTER — Emergency Department (HOSPITAL_COMMUNITY): Payer: BC Managed Care – PPO

## 2013-07-06 DIAGNOSIS — R079 Chest pain, unspecified: Secondary | ICD-10-CM

## 2013-07-06 DIAGNOSIS — G459 Transient cerebral ischemic attack, unspecified: Principal | ICD-10-CM | POA: Diagnosis present

## 2013-07-06 DIAGNOSIS — J449 Chronic obstructive pulmonary disease, unspecified: Secondary | ICD-10-CM

## 2013-07-06 DIAGNOSIS — Z9189 Other specified personal risk factors, not elsewhere classified: Secondary | ICD-10-CM

## 2013-07-06 DIAGNOSIS — I739 Peripheral vascular disease, unspecified: Secondary | ICD-10-CM

## 2013-07-06 DIAGNOSIS — Z86010 Personal history of colonic polyps: Secondary | ICD-10-CM

## 2013-07-06 DIAGNOSIS — Z7902 Long term (current) use of antithrombotics/antiplatelets: Secondary | ICD-10-CM | POA: Insufficient documentation

## 2013-07-06 DIAGNOSIS — R42 Dizziness and giddiness: Secondary | ICD-10-CM | POA: Diagnosis present

## 2013-07-06 DIAGNOSIS — E782 Mixed hyperlipidemia: Secondary | ICD-10-CM

## 2013-07-06 DIAGNOSIS — I714 Abdominal aortic aneurysm, without rupture, unspecified: Secondary | ICD-10-CM

## 2013-07-06 DIAGNOSIS — R918 Other nonspecific abnormal finding of lung field: Secondary | ICD-10-CM

## 2013-07-06 DIAGNOSIS — N400 Enlarged prostate without lower urinary tract symptoms: Secondary | ICD-10-CM

## 2013-07-06 DIAGNOSIS — Z23 Encounter for immunization: Secondary | ICD-10-CM | POA: Insufficient documentation

## 2013-07-06 DIAGNOSIS — R7309 Other abnormal glucose: Secondary | ICD-10-CM

## 2013-07-06 DIAGNOSIS — J383 Other diseases of vocal cords: Secondary | ICD-10-CM

## 2013-07-06 DIAGNOSIS — R55 Syncope and collapse: Secondary | ICD-10-CM

## 2013-07-06 DIAGNOSIS — R0989 Other specified symptoms and signs involving the circulatory and respiratory systems: Secondary | ICD-10-CM

## 2013-07-06 DIAGNOSIS — J4489 Other specified chronic obstructive pulmonary disease: Secondary | ICD-10-CM | POA: Insufficient documentation

## 2013-07-06 DIAGNOSIS — W19XXXA Unspecified fall, initial encounter: Secondary | ICD-10-CM | POA: Insufficient documentation

## 2013-07-06 DIAGNOSIS — R5383 Other fatigue: Secondary | ICD-10-CM

## 2013-07-06 DIAGNOSIS — Z79899 Other long term (current) drug therapy: Secondary | ICD-10-CM | POA: Insufficient documentation

## 2013-07-06 DIAGNOSIS — R269 Unspecified abnormalities of gait and mobility: Secondary | ICD-10-CM

## 2013-07-06 DIAGNOSIS — I129 Hypertensive chronic kidney disease with stage 1 through stage 4 chronic kidney disease, or unspecified chronic kidney disease: Secondary | ICD-10-CM | POA: Insufficient documentation

## 2013-07-06 DIAGNOSIS — H409 Unspecified glaucoma: Secondary | ICD-10-CM

## 2013-07-06 DIAGNOSIS — F17200 Nicotine dependence, unspecified, uncomplicated: Secondary | ICD-10-CM

## 2013-07-06 DIAGNOSIS — N183 Chronic kidney disease, stage 3 unspecified: Secondary | ICD-10-CM

## 2013-07-06 DIAGNOSIS — C4491 Basal cell carcinoma of skin, unspecified: Secondary | ICD-10-CM

## 2013-07-06 LAB — COMPREHENSIVE METABOLIC PANEL
ALT: 11 U/L (ref 0–53)
AST: 14 U/L (ref 0–37)
Albumin: 3.4 g/dL — ABNORMAL LOW (ref 3.5–5.2)
Alkaline Phosphatase: 77 U/L (ref 39–117)
CO2: 28 mEq/L (ref 19–32)
Chloride: 101 mEq/L (ref 96–112)
Creatinine, Ser: 1.6 mg/dL — ABNORMAL HIGH (ref 0.50–1.35)
GFR calc non Af Amer: 41 mL/min — ABNORMAL LOW (ref >90)
Potassium: 4.8 mEq/L (ref 3.5–5.1)
Sodium: 137 mEq/L (ref 135–145)
Total Bilirubin: 0.2 mg/dL — ABNORMAL LOW (ref 0.3–1.2)

## 2013-07-06 LAB — CBC WITH DIFFERENTIAL/PLATELET
Basophils Absolute: 0 10*3/uL (ref 0.0–0.1)
Basophils Relative: 0 % (ref 0–1)
HCT: 36.7 % — ABNORMAL LOW (ref 39.0–52.0)
Lymphocytes Relative: 11 % — ABNORMAL LOW (ref 12–46)
Monocytes Absolute: 0.8 10*3/uL (ref 0.1–1.0)
Neutro Abs: 7.2 10*3/uL (ref 1.7–7.7)
Neutrophils Relative %: 79 % — ABNORMAL HIGH (ref 43–77)
RDW: 13.8 % (ref 11.5–15.5)
WBC: 9.2 10*3/uL (ref 4.0–10.5)

## 2013-07-06 LAB — URINALYSIS, ROUTINE W REFLEX MICROSCOPIC
Bilirubin Urine: NEGATIVE
Glucose, UA: NEGATIVE mg/dL
Hgb urine dipstick: NEGATIVE
Ketones, ur: NEGATIVE mg/dL
Nitrite: NEGATIVE
Specific Gravity, Urine: 1.036 — ABNORMAL HIGH (ref 1.005–1.030)
pH: 5.5 (ref 5.0–8.0)

## 2013-07-06 LAB — URINE MICROSCOPIC-ADD ON

## 2013-07-06 LAB — POCT I-STAT TROPONIN I

## 2013-07-06 MED ORDER — DOXAZOSIN MESYLATE 2 MG PO TABS
2.0000 mg | ORAL_TABLET | Freq: Every day | ORAL | Status: DC
  Administered 2013-07-07: 2 mg via ORAL
  Filled 2013-07-06: qty 1

## 2013-07-06 MED ORDER — VARENICLINE TARTRATE 1 MG PO TABS
1.0000 mg | ORAL_TABLET | Freq: Two times a day (BID) | ORAL | Status: DC
  Administered 2013-07-07: 1 mg via ORAL
  Filled 2013-07-06 (×3): qty 1

## 2013-07-06 MED ORDER — LATANOPROST 0.005 % OP SOLN
1.0000 [drp] | Freq: Every day | OPHTHALMIC | Status: DC
  Filled 2013-07-06: qty 2.5

## 2013-07-06 MED ORDER — MELATONIN 1 MG PO CAPS: 1.0000 | ORAL_CAPSULE | Freq: Every evening | ORAL | Status: DC | PRN

## 2013-07-06 MED ORDER — SODIUM CHLORIDE 0.9 % IV SOLN: INTRAVENOUS | Status: DC

## 2013-07-06 MED ORDER — BRIMONIDINE TARTRATE 0.2 % OP SOLN
1.0000 [drp] | Freq: Three times a day (TID) | OPHTHALMIC | Status: DC
  Administered 2013-07-07: 1 [drp] via OPHTHALMIC
  Filled 2013-07-06: qty 5

## 2013-07-06 MED ORDER — ROSUVASTATIN CALCIUM 20 MG PO TABS
20.0000 mg | ORAL_TABLET | Freq: Every day | ORAL | Status: DC
  Filled 2013-07-06: qty 1

## 2013-07-06 MED ORDER — CILOSTAZOL 100 MG PO TABS
100.0000 mg | ORAL_TABLET | Freq: Every day | ORAL | Status: DC
  Administered 2013-07-07: 100 mg via ORAL
  Filled 2013-07-06: qty 1

## 2013-07-06 MED ORDER — INFLUENZA VAC SPLIT QUAD 0.5 ML IM SUSP
0.5000 mL | INTRAMUSCULAR | Status: AC
  Administered 2013-07-07: 0.5 mL via INTRAMUSCULAR
  Filled 2013-07-06: qty 0.5

## 2013-07-06 MED ORDER — SENNOSIDES-DOCUSATE SODIUM 8.6-50 MG PO TABS: 1.0000 | ORAL_TABLET | Freq: Every day | ORAL | Status: DC | PRN

## 2013-07-06 MED ORDER — DORZOLAMIDE HCL-TIMOLOL MAL 2-0.5 % OP SOLN
2.0000 [drp] | Freq: Two times a day (BID) | OPHTHALMIC | Status: DC
  Administered 2013-07-07 (×2): 2 [drp] via OPHTHALMIC
  Filled 2013-07-06: qty 10

## 2013-07-06 MED ORDER — ASPIRIN 81 MG PO CHEW
81.0000 mg | CHEWABLE_TABLET | Freq: Every day | ORAL | Status: DC
  Administered 2013-07-07: 81 mg via ORAL
  Filled 2013-07-06: qty 1

## 2013-07-06 MED ORDER — CLOPIDOGREL BISULFATE 75 MG PO TABS
75.0000 mg | ORAL_TABLET | Freq: Every day | ORAL | Status: DC
  Administered 2013-07-07: 75 mg via ORAL
  Filled 2013-07-06: qty 1

## 2013-07-06 MED ORDER — SODIUM CHLORIDE 0.9 % IV SOLN
INTRAVENOUS | Status: AC
  Administered 2013-07-06: 23:00:00 via INTRAVENOUS

## 2013-07-06 MED ORDER — BRIMONIDINE TARTRATE 0.15 % OP SOLN
1.0000 [drp] | Freq: Three times a day (TID) | OPHTHALMIC | Status: DC
  Filled 2013-07-06: qty 5

## 2013-07-06 MED ORDER — ATORVASTATIN CALCIUM 80 MG PO TABS: 80.0000 mg | ORAL_TABLET | Freq: Every day | ORAL | Status: DC

## 2013-07-06 MED ORDER — SENNA-DOCUSATE SODIUM 8.6-50 MG PO TABS: 1.0000 | ORAL_TABLET | Freq: Every day | ORAL | Status: DC | PRN

## 2013-07-06 MED ORDER — IOHEXOL 350 MG/ML SOLN
80.0000 mL | Freq: Once | INTRAVENOUS | Status: AC | PRN
  Administered 2013-07-06: 70 mL via INTRAVENOUS

## 2013-07-06 MED ORDER — RAMIPRIL 2.5 MG PO CAPS
2.5000 mg | ORAL_CAPSULE | Freq: Every day | ORAL | Status: DC
  Administered 2013-07-07: 2.5 mg via ORAL
  Filled 2013-07-06: qty 1

## 2013-07-06 MED ORDER — ACETAMINOPHEN 325 MG PO TABS: 650.0000 mg | ORAL_TABLET | ORAL | Status: DC | PRN

## 2013-07-06 MED ORDER — SODIUM CHLORIDE 0.9 % IV SOLN: INTRAVENOUS | Status: AC

## 2013-07-06 NOTE — H&P (Signed)
PCP:     Marga Melnick, MD  Oncology:   Chief Complaint:  Sherlynn Stalls headedness  HPI: NORTH ESTERLINE is a 72 y.o. male   has a past medical history of Hypertension; Hyperlipidemia; Glaucoma (BOTH EYES); adenomatous colonic polyps (2005); Impaired fasting glucose (2007); COPD (chronic obstructive pulmonary disease); History of lung cancer (APRIL 2012  SQUAMOUS CELL---- S/P RIGHT LOWER LOBECTOMY AT [facility] --  NO CHEMORADIATION---  NO RECURRENCE ); GERD (gastroesophageal reflux disease); BPH (benign prostatic hypertrophy); Frequency of urination; Urgency of urination; Nocturia; Lesion of bladder; Microhematuria; Arthritis; History of basal cell carcinoma excision; Exertional dyspnea; Peripheral vascular disease (S/P ANGIOPLASTY AND STENTING); PAD (peripheral artery disease) (ABI'S  JAN 2014  0.65 ON RIGHT ;  1.04 ON LEFT); AAA (abdominal aortic aneurysm) (LAST ABD. [name]  JAN 2014  3.4CM); and Bilateral carotid artery stenosis (DUPLEX 12-29-2012  BY DR [facility]).   Presented with  He felt lightheaded this morning this after noon  around 2:15 pm he started to feel lightheaded as well and and also noted that his left hand was flapping and both his legs felt "mushy" he fell down but did not lose conscienceness. He lay down for few minutes then got up and went into the house and called his daughter. He had mild shortness of breath but no chest pain no tingling. He denies any fever's or chills. He felt weak and and unwell for some time. His family brought him in. When he got up to go to ER he felt unsteady on his feet. CT of the head was unremarkable. CT per PE protocol negative for PE. Two years ago he was diagnosed with squamous cell carcinoma sp lower lobe resection.    Review of Systems:    Pertinent positives include: gait abnormality,  Slight confusion.   Constitutional:  No weight loss, night sweats, Fevers, chills, fatigue, weight loss  HEENT:  No headaches, Difficulty swallowing,Tooth/dental problems,Sore  throat,  No sneezing, itching, ear ache, nasal congestion, post nasal drip,  Cardio-vascular:  No chest pain, Orthopnea, PND, anasarca, dizziness, palpitations.no Bilateral lower extremity swelling  GI:  No heartburn, indigestion, abdominal pain, nausea, vomiting, diarrhea, change in bowel habits, loss of appetite, melena, blood in stool, hematemesis Resp:  no shortness of breath at rest. No dyspnea on exertion, No excess mucus, no productive cough, No non-productive cough, No coughing up of blood.No change in color of mucus.No wheezing. Skin:  no rash or lesions. No jaundice GU:  no dysuria, change in color of urine, no urgency or frequency. No straining to urinate.  No flank pain.  Musculoskeletal:  No joint pain or no joint swelling. No decreased range of motion. No back pain.  Psych:  No change in mood or affect. No depression or anxiety. No memory loss.  Neuro: no localizing neurological complaints, no tingling, no weakness, no double vision, no  no slurred speech, no confusion  Otherwise ROS are negative except for above, 10 systems were reviewed  Past Medical History: Past Medical History  Diagnosis Date  . Hypertension   . Hyperlipidemia   . Glaucoma BOTH EYES    Dr Nile Riggs  . Hx of adenomatous colonic polyps 2005     X 2; 1 hyperplastic polyp; Dr Juanda Chance  . Impaired fasting glucose 2007    108; A1c5.4%  . COPD (chronic obstructive pulmonary disease)   . History of lung cancer APRIL 2012  SQUAMOUS CELL---- S/P RIGHT LOWER LOBECTOMY AT [facility] --  NO CHEMORADIATION---  NO RECURRENCE  ONCOLOGIST- DR Bebe Liter IN [facility] 10-27-2012  . GERD (gastroesophageal reflux disease)   . BPH (benign prostatic hypertrophy)   . Frequency of urination   . Urgency of urination   . Nocturia   . Lesion of bladder   . Microhematuria   . Arthritis   . History of basal cell carcinoma excision   . Exertional dyspnea   . Peripheral vascular disease S/P ANGIOPLASTY AND STENTING     FOLLOWED  BY DR Edilia Bo  . PAD (peripheral artery disease) ABI'S  JAN 2014  0.65 ON RIGHT ;  1.04 ON LEFT  . AAA (abdominal aortic aneurysm) LAST ABD. [name]  JAN 2014  3.4CM    MONITORED BY DR Edilia Bo  . Bilateral carotid artery stenosis DUPLEX 12-29-2012  BY DR [facility]    BILATERAL ICA STENOSIS 60-79%   Past Surgical History  Procedure Laterality Date  . Angio plasty      X 4 in legs  . Colon polyectomy    . Trabecular surgery      OS  . Lung lobectomy  01/24/2011     RIGHT UPPER LOBE  (SQUAMOUS CELL CARCINOMA) Dr Desmond Lope , [facility]. No chemotherapyor radiation  . Laryngoscopy  06-27-2004    BX VOCAL CORD  (LEUKOPLAKIA)  PER PT NO ISSUES SINCE  . Cataract extraction w/ intraocular lens  implant, bilateral    . Basal cell carcinoma excision      MULTIPLE TIMES--  RIGHT FOREARM, CHEEKS, AND BACK  . Femoral-popliteal bypass graft Left 1994  [facility]    AND 2001  IN [place]  . Cardiovascular stress test  12-08-2012  DR [facility]    NORMAL LEXISCAN WITH NO EXERCISE NUCLEAR STUDY/ EF 66%/   NO ISCHEMIA/ NO SIGNIFICANT CHANGE FROM PRIOR STUDY  . Transthoracic echocardiogram  12-29-2012  DR [facility]    MILD LVH/  LVSF NORMAL/ EF 55-60%/  GRADE I DIASTOLIC DYSFUNCTION  . Aortogram  07-27-2002    MILD DIFFUSE ILIAC ARTERY OCCLUSIVE DISEASE /  LEFT RENAL ARTERY 20%/ PATENT LEFT FEM-POP GRAFT/ MILD SFA AND POPLITEAL ARTERY OCCLUSIVE DISEASE W/ SEVERE KIDNEY OCCLUSIVE DISEASE  . Cystoscopy w/ retrogrades Bilateral 01/21/2013    Procedure: CYSTOSCOPY WITH RETROGRADE PYELOGRAM;  Surgeon: Milford Cage, MD;  Location: [facility];  Service: Urology;  Laterality: Bilateral;   CYSTO, BLADDER BIOPSY, BILATERAL RETROGRADE PYELOGRAM  RAD TECH FROM RADIOLOGY PER JOY  . Cystoscopy with biopsy N/A 01/21/2013    Procedure: CYSTOSCOPY WITH BIOPSY;  Surgeon: Milford Cage, MD;  Location: [facility];  Service: Urology;  Laterality: N/A;     Medications: Prior to  Admission medications   Medication Sig Start Date End Date Taking? Authorizing Provider  bimatoprost (LUMIGAN) 0.03 % ophthalmic solution Place 1 drop into both eyes at bedtime.   Yes Historical Provider, MD  brimonidine (ALPHAGAN P) 0.1 % SOLN Place 3 drops into the left eye 3 (three) times daily.    Yes Historical Provider, MD  cilostazol (PLETAL) 100 MG tablet Take 100 mg by mouth daily.  10/15/12  Yes Pecola Lawless, MD  clopidogrel (PLAVIX) 75 MG tablet Take 75 mg by mouth daily.  12/19/12  Yes Pecola Lawless, MD  dorzolamide-timolol (COSOPT) 22.3-6.8 MG/ML ophthalmic solution Place 2 drops into the right eye 2 (two) times daily.   Yes Historical Provider, MD  doxazosin (CARDURA) 4 MG tablet Take 2 mg by mouth daily.   Yes Historical Provider, MD  MELATONIN PO Take 1 tablet  by mouth at bedtime as needed (sleep).    Yes Historical Provider, MD  Multiple Vitamins-Minerals (CENTRUM SILVER PO) Take 1 tablet by mouth daily.   Yes Historical Provider, MD  naproxen sodium (ANAPROX) 220 MG tablet Take 440 mg by mouth daily as needed (pain).   Yes Historical Provider, MD  ramipril (ALTACE) 2.5 MG capsule Take 1 capsule (2.5 mg total) by mouth daily. 12/13/11 07/06/13 Yes Laurey Morale, MD  rosuvastatin (CRESTOR) 40 MG tablet Take 20 mg by mouth every morning. 12/10/12  Yes Laurey Morale, MD  sennosides-docusate sodium (SENOKOT-S) 8.6-50 MG tablet Take 1 tablet by mouth daily as needed for constipation.   Yes Historical Provider, MD  varenicline (CHANTIX) 1 MG tablet Take 1 mg by mouth 2 (two) times daily.   Yes Historical Provider, MD    Allergies:   Allergies  Allergen Reactions  . Penicillins Shortness Of Breath    Flushing   . Sulfonamide Derivatives Shortness Of Breath    Flushing  . Niacin-Lovastatin Er Other (See Comments)    Dyspnea, flushing  . Atorvastatin Other (See Comments)    Myalgias & athralgias    Social History:  Ambulatory   Independently  Lives at home    reports  that he has been smoking Cigarettes.  He has a 25 pack-year smoking history. He has never used smokeless tobacco. He reports that  drinks alcohol. He reports that he does not use illicit drugs.   Family History: family history includes Alcohol abuse in his father; Heart attack in his father; Heart disease in his father, paternal aunt, and paternal uncle; Hypertension in his father and paternal aunt; Stroke in his father, mother, paternal aunt, and paternal uncle.    Physical Exam: Patient Vitals for the past 24 hrs:  BP Temp Temp src Pulse Resp SpO2  07/06/13 1600 105/48 mmHg - - 96 21 98 %  07/06/13 1545 124/75 mmHg - - 91 19 99 %  07/06/13 1530 109/58 mmHg - - 93 19 98 %  07/06/13 1517 117/62 mmHg 98.5 F (36.9 C) Oral 94 18 98 %  07/06/13 1515 117/62 mmHg - - - - -    1. General:  in No Acute distress 2. Psychological: Alert and  Oriented 3. Head/ENT:   Moist Mucous Membranes                          Head Non traumatic, neck supple                          Normal  Dentition 4. SKIN  decreased Skin turgor,  Skin clean Dry and intact no rash 5. Heart: irRegular rate and rhythm no Murmur, Rub or gallop 6. Lungs: Clear to auscultation bilaterally, no wheezes or crackles   7. Abdomen: Soft, non-tender, Non distended 8. Lower extremities: no clubbing, cyanosis, or edema 9. Neurologically cranial nerves II through XII intact strength 5 out of 5 in all 4 extremities no pronator drift noted. 10. MSK: Normal range of motion  body mass index is unknown because there is no weight on file.   Labs on Admission:   Recent Labs  07/06/13 1539  NA 137  K 4.8  CL 101  CO2 28  GLUCOSE 105*  BUN 18  CREATININE 1.60*  CALCIUM 9.8    Recent Labs  07/06/13 1539  AST 14  ALT 11  ALKPHOS 77  BILITOT 0.2*  PROT 7.3  ALBUMIN 3.4*   No results found for this basename: LIPASE, AMYLASE,  in the last 72 hours  Recent Labs  07/06/13 1539  WBC 9.2  NEUTROABS 7.2  HGB 12.4*  HCT  36.7*  MCV 86.2  PLT 291   No results found for this basename: CKTOTAL, CKMB, CKMBINDEX, TROPONINI,  in the last 72 hours No results found for this basename: TSH, T4TOTAL, FREET3, T3FREE, THYROIDAB,  in the last 72 hours No results found for this basename: VITAMINB12, FOLATE, FERRITIN, TIBC, IRON, RETICCTPCT,  in the last 72 hours Lab Results  Component Value Date   HGBA1C 5.8 03/02/2009    The CrCl is unknown because both a height and weight (above a minimum accepted value) are required for this calculation. ABG No results found for this basename: phart, pco2, po2, hco3, tco2, acidbasedef, o2sat     No results found for this basename: DDIMER     Other results:  I have pearsonaly reviewed this: ECG REPORT  Rate: 95  Rhythm: NSR ST&T Change: slight ST depressions in inferior leads  UA no UTI   Cultures: No results found for this basename: sdes, specrequest, cult, reptstatus       Radiological Exams on Admission: Ct Head Wo Contrast  07/06/2013   CLINICAL DATA:  Dizzy and lightheadedness today, with subsequent fall and struck head, patient on Plavix  EXAM: CT HEAD WITHOUT CONTRAST  TECHNIQUE: Contiguous axial images were obtained from the base of the skull through the vertex without intravenous contrast.  COMPARISON:  None.  FINDINGS: No hemorrhage or extra-axial fluid. No infarct or mass. No hydrocephalus. No skull fracture.  IMPRESSION: Negative   Electronically Signed   By: Esperanza Heir M.D.   On: 07/06/2013 17:54   Ct Angio Chest Pe W/cm &/or Wo Cm  07/06/2013   CLINICAL DATA:  Dizziness with a fall, hitting back of the head. On Plavix.  EXAM: CT ANGIOGRAPHY CHEST WITH CONTRAST  TECHNIQUE: Multidetector CT imaging of the chest was performed using the standard protocol during bolus administration of intravenous contrast. Multiplanar CT image reconstructions including MIPs were obtained to evaluate the vascular anatomy.  CONTRAST:  70mL OMNIPAQUE IOHEXOL 350 MG/ML SOLN   COMPARISON:  PET 12/22/2010 and CT chest 11/07/2010.  FINDINGS: Negative for pulmonary embolus. No pathologically enlarged mediastinal lymph nodes. Bi hilar lymphoid tissue. No axillary adenopathy. Atherosclerotic calcification of the arterial vasculature, including coronary arteries. Heart size within normal limits. No pericardial effusion.  Chronic appearing rind of pleural fluid in the lower right hemi thorax with pleural thickening. An extrapleural lymph node at the apex of the right hemi thorax measures 12 mm (image 16). Postoperative changes of right lower lobectomy with associated scarring and volume loss in the lower right hemi thorax. Scattered peripheral peribronchovascular nodularity is unchanged and likely post infectious in etiology. Airway is otherwise unremarkable.  Incidental imaging of the upper abdomen shows no acute findings. No worrisome lytic or sclerotic lesions. Degenerative changes are seen in the spine.  Review of the MIP images confirms the above findings.  IMPRESSION: 1. Negative for pulmonary embolus. 2. Postoperative changes of right lower lobectomy. 3. Small right fibrothorax. 4. Coronary artery calcifications.   Electronically Signed   By: Leanna Battles M.D.   On: 07/06/2013 18:14    Chart has been reviewed  Assessment/Plan 72 year old gentleman presents with presyncope/lightheadedness and atypical neurological complaints. TIA versus cardiac etiology also noted elevated creatinine   Present on Admission:  . Light-headed feeling- will check  orthostatics cycle cardiac markers monitor on telemetry  . TIA (transient ischemic attack) - TIA workup including MRI MRA carotid Dopplers and echo gram patient is peripheral vascular disease and trace for cerebrovascular disease as well. Of note he has known history of carotid artery stenosis. Continue Plavix and aspirin Elevated creatinine  - we'll monitor administer IV fluids and recheck  Prophylaxis: SCD  CODE STATUS: FULL  CODE  Other plan as per orders.  I have spent a total of 55 min on this admission  Ebany Bowermaster 07/06/2013, 8:36 PM

## 2013-07-06 NOTE — ED Notes (Signed)
Pt in room eating dinner brought from home by wife.  No complaints or desires at this time

## 2013-07-06 NOTE — ED Notes (Signed)
Admitting MD in rooM

## 2013-07-06 NOTE — ED Notes (Signed)
Pt transported to CT ?

## 2013-07-06 NOTE — Progress Notes (Signed)
Pharmacy D/C order for melatonin. Spoke with them regarding it as patient was requesting for sleep. Stated that we do not have the melatonin here but that patient would be able to bring home melatonin for dispensing tomorrow. Will continue to monitor.

## 2013-07-06 NOTE — ED Notes (Signed)
Pt reports he can not void at this time. Pt verbalizes understanding that urine sample needed. Pt reports he will call staff when he needs to void.

## 2013-07-06 NOTE — ED Notes (Signed)
MD at bedside. EDP DR Jeraldine Loots at bedside . DR Jeraldine Loots went to [employer] to get Pt a Soda.

## 2013-07-06 NOTE — ED Notes (Signed)
Pt reports when got out of car today He felt dizzy and light headed . He fell to ground and hit the back of his head. Pt reports to be taking Plavix.  Pt also reports he is taking Chantax to stop smoking.

## 2013-07-06 NOTE — Progress Notes (Signed)
During report asked ED nurse to perform Stroke swallow screen. ED RN stated pt had already eat prior to the nurse's arrival with no difficulties. Screen performed when patient arrived to floor per order. No problems noted. Swallow screen passed. Pt does have history of chronic cough secondary to history of bronchitis and lung cancer. But no aspiration suspected. Will continue to monitor.

## 2013-07-06 NOTE — ED Provider Notes (Signed)
CSN: [id number]     Arrival date & time 07/06/13  1509 History   First MD Initiated Contact with Patient 07/06/13 1524     Chief Complaint  Patient presents with  . Near Syncope  . Fall   (Consider location/radiation/quality/duration/timing/severity/associated sxs/prior Treatment) HPI Patient presents after an episode of near syncope and weakness. Patient states that he was in his usual state of health until approximately 4 hours prior to my evaluation.  About that time the patient had mild tremors on the left side.  In the subsequent hours the patient had persistent weakness, globally, and after a period of worsening symptoms, with concurrent lightheadedness, the patient went to the ground, striking his head and left elbow.  Patient denies complete loss of consciousness, but does state that he was nearly unconscious. Since that time he has had no pain, no confusion, no disorientation no no no visual changes.  There is no new dyspnea. Patient states that he has a history of atrial fibrillation, take Plavix, also has history of malignancy. On my evaluation he has no focal pain.  Past Medical History  Diagnosis Date  . Hypertension   . Hyperlipidemia   . Glaucoma BOTH EYES    Dr Nile Riggs  . Hx of adenomatous colonic polyps 2005     X 2; 1 hyperplastic polyp; Dr Juanda Chance  . Impaired fasting glucose 2007    108; A1c5.4%  . COPD (chronic obstructive pulmonary disease)   . History of lung cancer APRIL 2012  SQUAMOUS CELL---- S/P RIGHT LOWER LOBECTOMY AT [facility] --  NO CHEMORADIATION---  NO RECURRENCE     ONCOLOGIST- DR Mariel Sleet  LOV IN [facility] 10-27-2012  . GERD (gastroesophageal reflux disease)   . BPH (benign prostatic hypertrophy)   . Frequency of urination   . Urgency of urination   . Nocturia   . Lesion of bladder   . Microhematuria   . Arthritis   . History of basal cell carcinoma excision   . Exertional dyspnea   . Peripheral vascular disease S/P ANGIOPLASTY AND STENTING    FOLLOWED   BY DR Edilia Bo  . PAD (peripheral artery disease) ABI'S  JAN 2014  0.65 ON RIGHT ;  1.04 ON LEFT  . AAA (abdominal aortic aneurysm) LAST ABD. [name]  JAN 2014  3.4CM    MONITORED BY DR Edilia Bo  . Bilateral carotid artery stenosis DUPLEX 12-29-2012  BY DR [facility]    BILATERAL ICA STENOSIS 60-79%   Past Surgical History  Procedure Laterality Date  . Angio plasty      X 4 in legs  . Colon polyectomy    . Trabecular surgery      OS  . Lung lobectomy  01/24/2011     RIGHT UPPER LOBE  (SQUAMOUS CELL CARCINOMA) Dr Desmond Lope , [facility]. No chemotherapyor radiation  . Laryngoscopy  06-27-2004    BX VOCAL CORD  (LEUKOPLAKIA)  PER PT NO ISSUES SINCE  . Cataract extraction w/ intraocular lens  implant, bilateral    . Basal cell carcinoma excision      MULTIPLE TIMES--  RIGHT FOREARM, CHEEKS, AND BACK  . Femoral-popliteal bypass graft Left 1994  [facility]    AND 2001  IN [place]  . Cardiovascular stress test  12-08-2012  DR [facility]    NORMAL LEXISCAN WITH NO EXERCISE NUCLEAR STUDY/ EF 66%/   NO ISCHEMIA/ NO SIGNIFICANT CHANGE FROM PRIOR STUDY  . Transthoracic echocardiogram  12-29-2012  DR [facility]    MILD LVH/  LVSF NORMAL/ EF  55-60%/  GRADE I DIASTOLIC DYSFUNCTION  . Aortogram  07-27-2002    MILD DIFFUSE ILIAC ARTERY OCCLUSIVE DISEASE /  LEFT RENAL ARTERY 20%/ PATENT LEFT FEM-POP GRAFT/ MILD SFA AND POPLITEAL ARTERY OCCLUSIVE DISEASE W/ SEVERE KIDNEY OCCLUSIVE DISEASE  . Cystoscopy w/ retrogrades Bilateral 01/21/2013    Procedure: CYSTOSCOPY WITH RETROGRADE PYELOGRAM;  Surgeon: Milford Cage, MD;  Location[facility];  Service: Urology;  Laterality: Bilateral;   CYSTO, BLADDER BIOPSY, BILATERAL RETROGRADE PYELOGRAM  RAD TECH FROM RADIOLOGY PER JOY  . Cystoscopy with biopsy N/A 01/21/2013    Procedure: CYSTOSCOPY WITH BIOPSY;  Surgeon: Milford Cage, MD;  Location: [facility];  Service: Urology;  Laterality: N/A;   Family History  Problem Relation Age of  Onset  . Stroke Mother     mini strokes  . Alcohol abuse Father   . Heart disease Father     MI after 21  . Stroke Father   . Hypertension Father   . Heart attack Father   . Heart disease Paternal Aunt     several  . Hypertension Paternal Aunt     several  . Stroke Paternal Aunt     several  . Stroke Paternal Uncle     several  . Heart disease Paternal Uncle     several;2 had MI pre 64   History  Substance Use Topics  . Smoking status: Current Every Day Smoker -- 0.50 packs/day for 50 years    Types: Cigarettes  . Smokeless tobacco: Never Used  . Alcohol Use: Yes     Comment:  socially, variable    Review of Systems  Constitutional:       Per HPI, otherwise negative  HENT:       Per HPI, otherwise negative  Respiratory:       Per HPI, otherwise negative  Cardiovascular:       Per HPI, otherwise negative  Gastrointestinal: Negative for vomiting.  Endocrine:       Negative aside from HPI  Genitourinary:       Neg aside from HPI   Musculoskeletal:       Per HPI, otherwise negative  Skin: Negative.   Neurological: Positive for tremors, weakness and light-headedness. Negative for syncope.    Allergies  Penicillins; Sulfonamide derivatives; Niacin-lovastatin er; and Atorvastatin  Home Medications   Current Outpatient Rx  Name  Route  Sig  Dispense  Refill  . bimatoprost (LUMIGAN) 0.03 % ophthalmic solution   Both Eyes   Place 1 drop into both eyes at bedtime.         . brimonidine (ALPHAGAN P) 0.1 % SOLN   Left Eye   Place 3 drops into the left eye 3 (three) times daily.          . cilostazol (PLETAL) 100 MG tablet   Oral   Take 100 mg by mouth daily.          . clopidogrel (PLAVIX) 75 MG tablet   Oral   Take 75 mg by mouth daily.          . dorzolamide-timolol (COSOPT) 22.3-6.8 MG/ML ophthalmic solution   Right Eye   Place 2 drops into the right eye 2 (two) times daily.         [name] doxazosin (CARDURA) 4 MG tablet   Oral   Take 2 mg by  mouth daily.         [name] MELATONIN PO   Oral  Take 1 tablet by mouth at bedtime as needed (sleep).          . Multiple Vitamins-Minerals (CENTRUM SILVER PO)   Oral   Take 1 tablet by mouth daily.         . naproxen sodium (ANAPROX) 220 MG tablet   Oral   Take 440 mg by mouth daily as needed (pain).         . ramipril (ALTACE) 2.5 MG capsule   Oral   Take 1 capsule (2.5 mg total) by mouth daily.   30 capsule   11   . rosuvastatin (CRESTOR) 40 MG tablet   Oral   Take 20 mg by mouth every morning.         . sennosides-docusate sodium (SENOKOT-S) 8.6-50 MG tablet   Oral   Take 1 tablet by mouth daily as needed for constipation.         . varenicline (CHANTIX) 1 MG tablet   Oral   Take 1 mg by mouth 2 (two) times daily.          BP 117/62  Pulse 94  Temp(Src) 98.5 F (36.9 C) (Oral)  Resp 18  SpO2 98% Physical Exam  Nursing note and vitals reviewed. Constitutional: He is oriented to person, place, and time. He appears well-developed. No distress.  HENT:  Head: Normocephalic and atraumatic.    Eyes: Conjunctivae and EOM are normal.  Cardiovascular: Normal rate and regular rhythm.   Pulmonary/Chest: Effort normal. No stridor. No respiratory distress.  Abdominal: He exhibits no distension.  Musculoskeletal: He exhibits no edema.  Neurological: He is alert and oriented to person, place, and time. He displays no atrophy and no tremor. No cranial nerve deficit or sensory deficit. He exhibits normal muscle tone. He displays no seizure activity. Coordination normal.  Skin: Skin is warm and dry.     Psychiatric: He has a normal mood and affect.    ED Course  Procedures (including critical care time) Labs Review Labs Reviewed  CBC WITH DIFFERENTIAL  COMPREHENSIVE METABOLIC PANEL  PROTIME-INR  URINALYSIS, ROUTINE W REFLEX MICROSCOPIC  POCT CBG (FASTING - GLUCOSE)-MANUAL ENTRY   Imaging Review No results found. O2- 99%ra, normal  Ecg: 95 sr,  nonspecific t wave changes, abnormal though not significantly changed  Cardiac monitor: 90 sr, normal  6:39 PM Patient in no distress. He and his family are aware of all results. MDM  No diagnosis found. Patient presents after a prodromal near-syncopal episode.  With the patient's description of weakness, shaking, or some suspicion for TIA, though the episodes is more consistent with this arrhythmia.  Low suspicion for any CNS, or infection.  Patient's initial labs were largely reassuring, though there is mild renal dysfunction.  The patient's history of malignancy, dysrhythmia, PE was considered, but is not demonstrated on CT scan.  Patient was admitted for further evaluation and management.     Gerhard Munch, MD 07/06/13 [phone]

## 2013-07-07 ENCOUNTER — Telehealth: Payer: Self-pay | Admitting: Cardiology

## 2013-07-07 ENCOUNTER — Observation Stay (HOSPITAL_COMMUNITY): Payer: BC Managed Care – PPO

## 2013-07-07 DIAGNOSIS — R55 Syncope and collapse: Secondary | ICD-10-CM

## 2013-07-07 DIAGNOSIS — C3490 Malignant neoplasm of unspecified part of unspecified bronchus or lung: Secondary | ICD-10-CM

## 2013-07-07 DIAGNOSIS — J438 Other emphysema: Secondary | ICD-10-CM

## 2013-07-07 DIAGNOSIS — I639 Cerebral infarction, unspecified: Secondary | ICD-10-CM

## 2013-07-07 DIAGNOSIS — G459 Transient cerebral ischemic attack, unspecified: Secondary | ICD-10-CM

## 2013-07-07 HISTORY — DX: Cerebral infarction, unspecified: I63.9

## 2013-07-07 LAB — TROPONIN I
Troponin I: <0.3 ng/mL (ref <0.30)
Troponin I: <0.3 ng/mL (ref <0.30)
Troponin I: <0.3 ng/mL (ref <0.30)

## 2013-07-07 LAB — GLUCOSE, CAPILLARY: Glucose-Capillary: 111 mg/dL — ABNORMAL HIGH (ref 70–99)

## 2013-07-07 LAB — LIPID PANEL
HDL: 31 mg/dL — ABNORMAL LOW (ref >39)
LDL Cholesterol: 76 mg/dL (ref 0–99)
Triglycerides: 130 mg/dL (ref <150)
VLDL: 26 mg/dL (ref 0–40)

## 2013-07-07 LAB — HEMOGLOBIN A1C: Hgb A1c MFr Bld: 6.1 % — ABNORMAL HIGH (ref <5.7)

## 2013-07-07 MED ORDER — ASPIRIN 81 MG PO CHEW: 81.0000 mg | CHEWABLE_TABLET | Freq: Every day | ORAL | Status: DC

## 2013-07-07 NOTE — Progress Notes (Signed)
Pt d/c to home by car with family. Assessment stable. Prescriptions given. Pt verbalizes understanding of d/c instructions. 

## 2013-07-07 NOTE — Progress Notes (Signed)
OT NOTE  Pt with discharge orders and currently leaving before OT evaluation. Pt Troy [name] completed assessment and reports no acute OT needs at this time.    Troy [name]   OTR/L Pager: [phone] Office: [phone] .

## 2013-07-07 NOTE — Telephone Encounter (Signed)
He is being discharged today.  Please make sure he has followup appt and call his wife to give her the appt. Needs to be with me.

## 2013-07-07 NOTE — Evaluation (Signed)
Physical Therapy Evaluation Patient Details Name: Troy [name] MRN: [id number] DOB: 1941/03/03 Today's Date: 07/07/2013 Time: 7829-5621 PT Time Calculation (min): 18 min  PT Assessment / Plan / Recommendation History of Present Illness  pt presents with dizziness and Orthostatic Hypotension.    Clinical Impression  Pt appears to be at baseline level of function.  Pt does report having stopped exercising ~32months ago and has noticed difficulties with his balance ever since.  Feel pt would benefit from OPPT for high level balance and returning to regular exercise.      PT Assessment  All further PT needs can be met in the next venue of care    Follow Up Recommendations  Outpatient PT    Does the patient have the potential to tolerate intense rehabilitation      Barriers to Discharge        Equipment Recommendations  None recommended by PT    Recommendations for Other Services     Frequency      Precautions / Restrictions Precautions Precautions: None Restrictions Weight Bearing Restrictions: No   Pertinent Vitals/Pain Denied pain.        Mobility  Bed Mobility Bed Mobility: Supine to Sit;Sitting - Scoot to Edge of Bed Supine to Sit: 7: Independent Sitting - Scoot to Edge of Bed: 7: Independent Transfers Transfers: Sit to Stand;Stand to Sit Sit to Stand: 6: Modified independent (Device/Increase time);With upper extremity assist;From bed Stand to Sit: 6: Modified independent (Device/Increase time);With upper extremity assist;To bed Details for Transfer Assistance: Needs use of UEs.   Ambulation/Gait Ambulation/Gait Assistance: 7: Independent Ambulation Distance (Feet): 300 Feet Assistive device: None Ambulation/Gait Assistance Details: pt moves well, with only mild balance deficits when challenged.   Gait Pattern: Step-through pattern;Decreased stride length Stairs: Yes Stairs Assistance: 6: Modified independent (Device/Increase time) Stair Management Technique:  One rail Right;Alternating pattern;Forwards Number of Stairs: 11 Wheelchair Mobility Wheelchair Mobility: No    Exercises     PT Diagnosis: Difficulty walking  PT Problem List: Decreased balance PT Treatment Interventions:       PT Goals(Current goals can be found in the care plan section)    Visit Information  Last PT Received On: 07/07/13 Assistance Needed: +1 History of Present Illness: pt presents with dizziness and Orthostatic Hypotension.         Prior Functioning  Home Living Family/patient expects to be discharged to:: Private residence Living Arrangements: Spouse/significant other Available Help at Discharge: Family;Available 24 hours/day Type of Home: House Home Access: Stairs to enter [employer] of Steps: 1 (and 1) Entrance Stairs-Rails: None Home Layout: Two level;Able to live on main level with bedroom/bathroom Alternate Level Stairs-Number of Steps: flight Alternate Level Stairs-Rails: Right Home Equipment: None Prior Function Level of Independence: Needs assistance ADL's / Homemaking Assistance Needed: Wife A with reaching socks and starting pants over feet.   Communication Communication: No difficulties    Cognition  Cognition Arousal/Alertness: Awake/alert Behavior During Therapy: WFL for tasks assessed/performed Overall Cognitive Status: Within Functional Limits for tasks assessed    Extremity/Trunk Assessment Upper Extremity Assessment Upper Extremity Assessment: Overall WFL for tasks assessed Lower Extremity Assessment Lower Extremity Assessment: Overall WFL for tasks assessed   Balance Balance Balance Assessed: Yes Standardized Balance Assessment Standardized Balance Assessment: Dynamic Gait Index Dynamic Gait Index Level Surface: Normal Change in Gait Speed: Normal Gait with Horizontal Head Turns: Normal Gait with Vertical Head Turns: Normal Gait and Pivot Turn: Mild Impairment Step Over Obstacle: Mild Impairment Step  Around Obstacles: Normal  Steps: Mild Impairment Total Score: 21  End of Session PT - End of Session Equipment Utilized During Treatment: Gait belt Activity Tolerance: Patient tolerated treatment well Patient left: in bed;with call bell/phone within reach;with family/visitor present (Sitting EOB) Nurse Communication: Mobility status  GP Functional Assessment Tool Used: Clinical Judgement Functional Limitation: Mobility: Walking and moving around Mobility: Walking and Moving Around Current Status ([id number]): 0 percent impaired, limited or restricted Mobility: Walking and Moving Around Discharge Status [phone]): 0 percent impaired, limited or restricted   Sunny Schlein, [place] [phone] 07/07/2013, 1:43 PM

## 2013-07-07 NOTE — Progress Notes (Signed)
  Echocardiogram 2D Echocardiogram limited has been performed. This study was technically difficult and technically limited.   Kaydyn Sayas 07/07/2013, 9:29 AM

## 2013-07-07 NOTE — Progress Notes (Signed)
*  PRELIMINARY RESULTS* Vascular Ultrasound Carotid Duplex (Doppler) has been completed.  Preliminary findings: Right = >80% ICA stenosis. Moderate ECA stenosis. Left = 60-79% ICA stenosis, low end of scale.  Antegrade vertebral flow.   Farrel Demark, RDMS, RVT  07/07/2013, 9:39 AM

## 2013-07-07 NOTE — Discharge Summary (Addendum)
Physician Discharge Summary  Troy [name] WGN:[id number] DOB: [birth year] DOA: 07/06/2013  PCP: Marga Melnick, MD  Admit date: 07/06/2013 Discharge date: 07/07/2013  Time spent: > 35 minutes  Recommendations for Outpatient Follow-up:  1. Please be sure to follow up with your Vascular surgeon in 1-2 weeks for evaluation of your carotid stenosis. 2. Also please encourage cessation of caffeine.  Discharge Diagnoses:  Active Problems:   Light-headed feeling   Chronic renal disease, stage 3, moderately decreased glomerular filtration rate between 30-59 mL/min/1.73 square meter   TIA (transient ischemic attack)   Discharge Condition: stable  Diet recommendation: Low sodium, heart healthy  Filed Weights   07/06/13 2300  Weight: 89.404 kg (197 lb 1.6 oz)    History of present illness:  72 y/o with h/o carotid stenosis currently followed by Dr. Ashley Royalty who follows up regularly q 6 months for carotid dopplers, h/o glaucoma Both eyes, and s/p RL lobectomy for squamous cell carcinoma in 2012. Who presented to the ED complaining of light headedness and near syncope.  Hospital Course:  Near syncope - TIA/stroke initially considered and as such work up included: - MRI of head: reported as normal for age - MRA of head: Extensive ICA siphon calcified atherosclerosis. Still, no  hemodynamically significant stenosis identified. Laterally directed  atherosclerotic pseudo-lesion of the left cavernous ICA suspected - Troponins negative x 3 - CT of head: reported as negative - CT angiogram: Negative for PE - continue low dose aspirin and plavix and will have secretary set up appointment with Vascular surgeon for follow up.  Patient agreeable with this plan and would like to be discharged and currently prefers close follow up with his vascular surgeon. - I do not believe based on history and current imaging studies that patient had TIA.  I suspect that he most likely had orthostatic hypotension. He  reports that he drinks coffee everyday as well as a coke but does not drink a lot of water. Patient had positive orthostatic hypotension on orthostatic vital checks.  Which improved after IVF rehydration and improved oral intake.  Also patient had elevated specific gravity which coincides with concentrated urine most likely due to dehydration. - As such I'm recommending cessation of caffeinated products which are most likely are depleting him intravascularly and increased in oral fluid intake.  Addendum: Physical therapy recommended outpatient PT for improvement in balance but had no other recommendations. I have provided script.  Procedures:  Please see above  Consultations:  None  Discharge Exam: Filed Vitals:   07/07/13 1123  BP: 112/60  Pulse: 86  Temp:   Resp:     General: Pt in NAD, alert and awake Cardiovascular: RRR, no MRG Respiratory: CTA BL, no wheezes  Discharge Instructions   Future Appointments Provider Department Dept Phone   07/24/2013 3:30 PM Laurey Morale, MD [facility] [place] Office [phone]   08/18/2013 1:30 PM Pecola Lawless, MD [facility]  [place] [phone]   10/21/2013 8:30 AM [facility] [address] [phone]   Eat a light meal the night before the exam but please avoid gaseous foods.   Nothing to eat or drink for at least 8 hours prior to the exam. No gum chewing or smoking the morning of the exam. Please take your morning medications with small sips of water, especially blood pressure medication. If you have several vascular lab exams and will see physician, please bring a snack with you.   10/21/2013 9:00 AM  [name] [address] [phone]   10/21/2013 10:00 AM Annye Rusk, NP [facility] [phone]       Medication List    ASK your doctor about these medications       bimatoprost 0.03 % ophthalmic solution   Commonly known as:  LUMIGAN  Place 1 drop into both eyes at bedtime.     brimonidine 0.1 % Soln  Commonly known as:  ALPHAGAN P  Place 3 drops into the left eye 3 (three) times daily.     CENTRUM SILVER PO  Take 1 tablet by mouth daily.     cilostazol 100 MG tablet  Commonly known as:  PLETAL  Take 100 mg by mouth daily.     dorzolamide-timolol 22.3-6.8 MG/ML ophthalmic solution  Commonly known as:  COSOPT  Place 2 drops into the right eye 2 (two) times daily.     doxazosin 4 MG tablet  Commonly known as:  CARDURA  Take 2 mg by mouth daily.     MELATONIN PO  Take 1 tablet by mouth at bedtime as needed (sleep).     naproxen sodium 220 MG tablet  Commonly known as:  ANAPROX  Take 440 mg by mouth daily as needed (pain).     PLAVIX 75 MG tablet  Generic drug:  clopidogrel  Take 75 mg by mouth daily.     ramipril 2.5 MG capsule  Commonly known as:  ALTACE  Take 1 capsule (2.5 mg total) by mouth daily.     rosuvastatin 40 MG tablet  Commonly known as:  CRESTOR  Take 20 mg by mouth every morning.     sennosides-docusate sodium 8.6-50 MG tablet  Commonly known as:  SENOKOT-S  Take 1 tablet by mouth daily as needed for constipation.     varenicline 1 MG tablet  Commonly known as:  CHANTIX  Take 1 mg by mouth 2 (two) times daily.       Allergies  Allergen Reactions  . Penicillins Shortness Of Breath    Flushing   . Sulfonamide Derivatives Shortness Of Breath    Flushing  . Niacin-Lovastatin Er Other (See Comments)    Dyspnea, flushing  . Atorvastatin Other (See Comments)    Myalgias & athralgias      The results of significant diagnostics from this hospitalization (including imaging, microbiology, ancillary and laboratory) are listed below for reference.    Significant Diagnostic Studies: Ct Head Wo Contrast  07/06/2013   CLINICAL DATA:  Dizzy and lightheadedness today, with subsequent fall and struck head, patient on Plavix  EXAM: CT HEAD WITHOUT CONTRAST   TECHNIQUE: Contiguous axial images were obtained from the base of the skull through the vertex without intravenous contrast.  COMPARISON:  None.  FINDINGS: No hemorrhage or extra-axial fluid. No infarct or mass. No hydrocephalus. No skull fracture.  IMPRESSION: Negative   Electronically Signed   By: Esperanza Heir M.D.   On: 07/06/2013 17:54   Ct Angio Chest Pe W/cm &/or Wo Cm  07/06/2013   CLINICAL DATA:  Dizziness with a fall, hitting back of the head. On Plavix.  EXAM: CT ANGIOGRAPHY CHEST WITH CONTRAST  TECHNIQUE: Multidetector CT imaging of the chest was performed using the standard protocol during bolus administration of intravenous contrast. Multiplanar CT image reconstructions including MIPs were obtained to evaluate the vascular anatomy.  CONTRAST:  70mL OMNIPAQUE IOHEXOL 350 MG/ML SOLN  COMPARISON:  PET 12/22/2010 and CT chest 11/07/2010.  FINDINGS: Negative  for pulmonary embolus. No pathologically enlarged mediastinal lymph nodes. Bi hilar lymphoid tissue. No axillary adenopathy. Atherosclerotic calcification of the arterial vasculature, including coronary arteries. Heart size within normal limits. No pericardial effusion.  Chronic appearing rind of pleural fluid in the lower right hemi thorax with pleural thickening. An extrapleural lymph node at the apex of the right hemi thorax measures 12 mm (image 16). Postoperative changes of right lower lobectomy with associated scarring and volume loss in the lower right hemi thorax. Scattered peripheral peribronchovascular nodularity is unchanged and likely post infectious in etiology. Airway is otherwise unremarkable.  Incidental imaging of the upper abdomen shows no acute findings. No worrisome lytic or sclerotic lesions. Degenerative changes are seen in the spine.  Review of the MIP images confirms the above findings.  IMPRESSION: 1. Negative for pulmonary embolus. 2. Postoperative changes of right lower lobectomy. 3. Small right fibrothorax. 4. Coronary  artery calcifications.   Electronically Signed   By: Leanna Battles M.D.   On: 07/06/2013 18:14   Mri Brain Without Contrast  07/07/2013   CLINICAL DATA:  72 year old male with dizziness, lightheadedness. Recent fall. On Plavix.  EXAM: MRI HEAD WITHOUT CONTRAST  MRA HEAD WITHOUT CONTRAST  TECHNIQUE: Multiplanar, multiecho pulse sequences of the brain and surrounding structures were obtained without intravenous contrast. Angiographic images of the head were obtained using MRA technique without contrast.  COMPARISON:  Head CT 07/06/2013.  FINDINGS: MRI HEAD FINDINGS  Cerebral volume is within normal limits for age. No restricted diffusion to suggest acute infarction. No midline shift, mass effect, evidence of mass lesion, ventriculomegaly, extra-axial collection or acute intracranial hemorrhage. Cervicomedullary junction and pituitary are within normal limits. Negative visualized cervical spine. Major intracranial vascular flow voids are preserved, dominant distal right vertebral artery. Wallace Cullens and white matter signal is within normal limits for age throughout the brain.  Visible internal auditory structures appear normal. Mastoids are clear.  Postoperative changes to the globes. Negative paranasal sinuses. Visualized scalp soft tissues are within normal limits. Normal bone marrow signal.  MRA HEAD FINDINGS  Antegrade flow in the posterior circulation. Dominant distal right vertebral artery supplies the basilar. Non dominant left vertebral artery terminates in PICA. Normal right PICA origin. Patent AICA origins. No basilar stenosis. SCA and left PCA origin are normal. Fetal type right PCA origin. Bilateral PCA branches are within normal limits.  Antegrade flow in both ICA siphons. Moderate to severe irregularity in keeping with the calcified plaque seen on the recent comparison. Laterally directed atherosclerotic pseudo lesion suspected in the left cavernous segment on series 5, image 68. No right ICA siphon  stenosis. Mild left ICA anterior genu stenosis, does not appear to be hemodynamically significant.  Ophthalmic and right posterior communicating artery origins are normal. Diminutive or absent left posterior communicating artery. Patent ICA termini. MCA and ACA origins within normal limits. Anterior communicating artery diminutive or absent. Visualized ACA branches within normal limits. Visualized bilateral MCA branches within normal limits.  IMPRESSION: MRI HEAD IMPRESSION  Normal for age non contrast MRI appearance of the brain.  MRA HEAD IMPRESSION  1. Extensive ICA siphon calcified atherosclerosis. Still, no hemodynamically significant stenosis identified. Laterally directed atherosclerotic pseudo-lesion of the left cavernous ICA suspected (rather than a small cavernous aneurysm).  2. Otherwise negative intracranial MRA.   Electronically Signed   By: Augusto Gamble M.D.   On: 07/07/2013 11:28   Mr Maxine Glenn Head/brain Wo Cm  07/07/2013   CLINICAL DATA:  72 year old male with dizziness, lightheadedness. Recent fall. On Plavix.  EXAM: MRI HEAD WITHOUT CONTRAST  MRA HEAD WITHOUT CONTRAST  TECHNIQUE: Multiplanar, multiecho pulse sequences of the brain and surrounding structures were obtained without intravenous contrast. Angiographic images of the head were obtained using MRA technique without contrast.  COMPARISON:  Head CT 07/06/2013.  FINDINGS: MRI HEAD FINDINGS  Cerebral volume is within normal limits for age. No restricted diffusion to suggest acute infarction. No midline shift, mass effect, evidence of mass lesion, ventriculomegaly, extra-axial collection or acute intracranial hemorrhage. Cervicomedullary junction and pituitary are within normal limits. Negative visualized cervical spine. Major intracranial vascular flow voids are preserved, dominant distal right vertebral artery. Wallace Cullens and white matter signal is within normal limits for age throughout the brain.  Visible internal auditory structures appear normal.  Mastoids are clear.  Postoperative changes to the globes. Negative paranasal sinuses. Visualized scalp soft tissues are within normal limits. Normal bone marrow signal.  MRA HEAD FINDINGS  Antegrade flow in the posterior circulation. Dominant distal right vertebral artery supplies the basilar. Non dominant left vertebral artery terminates in PICA. Normal right PICA origin. Patent AICA origins. No basilar stenosis. SCA and left PCA origin are normal. Fetal type right PCA origin. Bilateral PCA branches are within normal limits.  Antegrade flow in both ICA siphons. Moderate to severe irregularity in keeping with the calcified plaque seen on the recent comparison. Laterally directed atherosclerotic pseudo lesion suspected in the left cavernous segment on series 5, image 68. No right ICA siphon stenosis. Mild left ICA anterior genu stenosis, does not appear to be hemodynamically significant.  Ophthalmic and right posterior communicating artery origins are normal. Diminutive or absent left posterior communicating artery. Patent ICA termini. MCA and ACA origins within normal limits. Anterior communicating artery diminutive or absent. Visualized ACA branches within normal limits. Visualized bilateral MCA branches within normal limits.  IMPRESSION: MRI HEAD IMPRESSION  Normal for age non contrast MRI appearance of the brain.  MRA HEAD IMPRESSION  1. Extensive ICA siphon calcified atherosclerosis. Still, no hemodynamically significant stenosis identified. Laterally directed atherosclerotic pseudo-lesion of the left cavernous ICA suspected (rather than a small cavernous aneurysm).  2. Otherwise negative intracranial MRA.   Electronically Signed   By: Augusto Gamble M.D.   On: 07/07/2013 11:28    Microbiology: No results found for this or any previous visit (from the past 240 hour(s)).   Labs: Basic Metabolic Panel:  Recent Labs Lab 07/06/13 1539  NA 137  K 4.8  CL 101  CO2 28  GLUCOSE 105*  BUN 18  CREATININE  1.60*  CALCIUM 9.8   Liver Function Tests:  Recent Labs Lab 07/06/13 1539  AST 14  ALT 11  ALKPHOS 77  BILITOT 0.2*  PROT 7.3  ALBUMIN 3.4*   No results found for this basename: LIPASE, AMYLASE,  in the last 168 hours No results found for this basename: AMMONIA,  in the last 168 hours CBC:  Recent Labs Lab 07/06/13 1539  WBC 9.2  NEUTROABS 7.2  HGB 12.4*  HCT 36.7*  MCV 86.2  PLT 291   Cardiac Enzymes:  Recent Labs Lab 07/06/13 2325 07/07/13 0540 07/07/13 1225  TROPONINI <0.30 <0.30 <0.30   BNP: BNP (last 3 results)  Recent Labs  12/08/12 1010  PROBNP 153.0*   CBG:  Recent Labs Lab 07/07/13 1136  GLUCAP 111*       Signed:  Penny Pia  [facility] 07/07/2013, 1:36 PM

## 2013-07-07 NOTE — Telephone Encounter (Signed)
New Problem:  Pt's wife states Troy [name] is at [facility] hospital in the [place] tower, 4th floor, room 1... Pt's wife just wanted to make dr. Shirlee Latch aware

## 2013-07-08 ENCOUNTER — Encounter: Payer: Self-pay | Admitting: Vascular Surgery

## 2013-07-08 ENCOUNTER — Other Ambulatory Visit: Payer: Self-pay | Admitting: Vascular Surgery

## 2013-07-08 NOTE — Telephone Encounter (Signed)
Pt's wife advised appt with Dr Shirlee Latch 07/24/13 .

## 2013-07-09 ENCOUNTER — Ambulatory Visit (HOSPITAL_COMMUNITY)
Admission: RE | Admit: 2013-07-09 | Discharge: 2013-07-09 | Disposition: A | Discharge status: Home / Self Care | Payer: BC Managed Care – PPO | Source: Ambulatory Visit | Attending: Vascular Surgery | Admitting: Vascular Surgery

## 2013-07-09 ENCOUNTER — Encounter (HOSPITAL_COMMUNITY): Payer: Self-pay | Admitting: Pharmacy Technician

## 2013-07-09 ENCOUNTER — Encounter: Payer: Self-pay | Admitting: Vascular Surgery

## 2013-07-09 ENCOUNTER — Other Ambulatory Visit: Payer: Self-pay

## 2013-07-09 ENCOUNTER — Other Ambulatory Visit: Payer: Self-pay | Admitting: Internal Medicine

## 2013-07-09 ENCOUNTER — Ambulatory Visit (INDEPENDENT_AMBULATORY_CARE_PROVIDER_SITE_OTHER): Payer: BC Managed Care – PPO | Admitting: Internal Medicine

## 2013-07-09 ENCOUNTER — Ambulatory Visit (INDEPENDENT_AMBULATORY_CARE_PROVIDER_SITE_OTHER): Payer: BC Managed Care – PPO | Admitting: Vascular Surgery

## 2013-07-09 ENCOUNTER — Encounter: Payer: Self-pay | Admitting: Internal Medicine

## 2013-07-09 VITALS — BP 106/60 | HR 78 | Temp 98.3°F | Resp 12 | Wt 198.0 lb

## 2013-07-09 DIAGNOSIS — G459 Transient cerebral ischemic attack, unspecified: Secondary | ICD-10-CM

## 2013-07-09 DIAGNOSIS — R059 Cough, unspecified: Secondary | ICD-10-CM

## 2013-07-09 DIAGNOSIS — J438 Other emphysema: Secondary | ICD-10-CM

## 2013-07-09 DIAGNOSIS — I6529 Occlusion and stenosis of unspecified carotid artery: Secondary | ICD-10-CM | POA: Insufficient documentation

## 2013-07-09 DIAGNOSIS — F17200 Nicotine dependence, unspecified, uncomplicated: Secondary | ICD-10-CM

## 2013-07-09 DIAGNOSIS — R0609 Other forms of dyspnea: Secondary | ICD-10-CM

## 2013-07-09 DIAGNOSIS — R05 Cough: Secondary | ICD-10-CM

## 2013-07-09 DIAGNOSIS — J449 Chronic obstructive pulmonary disease, unspecified: Secondary | ICD-10-CM | POA: Insufficient documentation

## 2013-07-09 DIAGNOSIS — J439 Emphysema, unspecified: Secondary | ICD-10-CM

## 2013-07-09 MED ORDER — CILOSTAZOL 100 MG PO TABS: 100.0000 mg | ORAL_TABLET | Freq: Every day | ORAL | Status: DC

## 2013-07-09 MED ORDER — TIOTROPIUM BROMIDE MONOHYDRATE 18 MCG IN CAPS: 18.0000 ug | ORAL_CAPSULE | Freq: Every day | RESPIRATORY_TRACT | Status: DC

## 2013-07-09 NOTE — Progress Notes (Addendum)
[facility] [place] HISTORY AND PHYSICAL   History of Present Illness:  Patient is a 72 y.o. year old male who presents for evaluation of symptomatic right internal carotid artery stenosis.  The patient had an episode of presyncope dizziness left hand ataxia and left leg weakness which occurred 3 days ago. He also had some purple patches in his right eye. This resolved over several hours. The patient denies prior history or stroke. He denies prior history of TIA or amaurosis. The patient has been followed for many years by Dr. Edilia Bo for peripheral arterial disease and a small abdominal aortic aneurysm. He is on a statin aspirin and Plavix. He had a fairly normal stress test and Echo in March of this year. This was done by Dr. Ronelle Nigh.  He does have a history of some ectopic beats. He has a remote history of atrial fibrillation. Other medical problems include hypertension hyperlipidemia glaucoma abdominal aortic aneurysm (3.4 cm in January 2014) and remote history of lung cancer all of which are currently.  He is a smoker but is currently on Chantix trying to quit.  Past Medical History  Diagnosis Date  . Hypertension   . Hyperlipidemia   . Glaucoma BOTH EYES    Dr Nile Riggs  . Hx of adenomatous colonic polyps 2005     X 2; 1 hyperplastic polyp; Dr Juanda Chance  . Impaired fasting glucose 2007    108; A1c5.4%  . History of lung cancer APRIL 2012  SQUAMOUS CELL---- S/P RIGHT LOWER LOBECTOMY AT [facility] --  NO CHEMORADIATION---  NO RECURRENCE     ONCOLOGIST- DR Mariel Sleet  LOV IN [facility] 10-27-2012  . GERD (gastroesophageal reflux disease)   . BPH (benign prostatic hypertrophy)   . Frequency of urination   . Urgency of urination   . Nocturia   . Lesion of bladder   . Microhematuria   . Arthritis   . History of basal cell carcinoma excision   . Exertional dyspnea   . Peripheral vascular disease S/P ANGIOPLASTY AND STENTING    FOLLOWED  BY DR Edilia Bo  . PAD (peripheral artery disease)  ABI'S  JAN 2014  0.65 ON RIGHT ;  1.04 ON LEFT  . AAA (abdominal aortic aneurysm) LAST ABD. [name]  JAN 2014  3.4CM    MONITORED BY DR Edilia Bo  . Bilateral carotid artery stenosis DUPLEX 12-29-2012  BY DR [facility]    BILATERAL ICA STENOSIS 60-79%  . COPD (chronic obstructive pulmonary disease)     Past Surgical History  Procedure Laterality Date  . Angio plasty      X 4 in legs  . Colon polyectomy    . Trabecular surgery      OS  . Lung lobectomy  01/24/2011     RIGHT UPPER LOBE  (SQUAMOUS CELL CARCINOMA) Dr Desmond Lope , [facility]. No chemotherapyor radiation  . Laryngoscopy  06-27-2004    BX VOCAL CORD  (LEUKOPLAKIA)  PER PT NO ISSUES SINCE  . Cataract extraction w/ intraocular lens  implant, bilateral    . Basal cell carcinoma excision      MULTIPLE TIMES--  RIGHT FOREARM, CHEEKS, AND BACK  . Femoral-popliteal bypass graft Left 1994  [facility]    AND 2001  IN [place]  . Cardiovascular stress test  12-08-2012  DR [facility]    NORMAL LEXISCAN WITH NO EXERCISE NUCLEAR STUDY/ EF 66%/   NO ISCHEMIA/ NO SIGNIFICANT CHANGE FROM PRIOR STUDY  . Transthoracic echocardiogram  12-29-2012  DR [facility]  MILD LVH/  LVSF NORMAL/ EF 55-60%/  GRADE I DIASTOLIC DYSFUNCTION  . Aortogram  07-27-2002    MILD DIFFUSE ILIAC ARTERY OCCLUSIVE DISEASE /  LEFT RENAL ARTERY 20%/ PATENT LEFT FEM-POP GRAFT/ MILD SFA AND POPLITEAL ARTERY OCCLUSIVE DISEASE W/ SEVERE KIDNEY OCCLUSIVE DISEASE  . Cystoscopy w/ retrogrades Bilateral 01/21/2013    Procedure: CYSTOSCOPY WITH RETROGRADE PYELOGRAM;  Surgeon: Milford Cage, MD;  Location: [facility];  Service: Urology;  Laterality: Bilateral;   CYSTO, BLADDER BIOPSY, BILATERAL RETROGRADE PYELOGRAM  RAD TECH FROM RADIOLOGY PER JOY  . Cystoscopy with biopsy N/A 01/21/2013    Procedure: CYSTOSCOPY WITH BIOPSY;  Surgeon: Milford Cage, MD;  Location: [facility];  Service: Urology;  Laterality: N/A;    Social History History  Substance Use  Topics  . Smoking status: Current Every Day Smoker -- 0.50 packs/day for 50 years    Types: Cigarettes  . Smokeless tobacco: Never Used     Comment: pt states that he is now taking chantix  . Alcohol Use: Yes     Comment:  socially, variable    Family History Family History  Problem Relation Age of Onset  . Stroke Mother     mini strokes  . Alcohol abuse Father   . Heart disease Father     MI after 51  . Stroke Father   . Hypertension Father   . Heart attack Father   . Heart disease Paternal Aunt     several  . Hypertension Paternal Aunt     several  . Stroke Paternal Aunt     several  . Stroke Paternal Uncle     several  . Heart disease Paternal Uncle     several;2 had MI pre 69    Allergies  Allergies  Allergen Reactions  . Penicillins Shortness Of Breath    Flushing  & dyspnea Because of a history of documented adverse serious drug reaction;Medi Alert bracelet  is recommended  . Sulfonamide Derivatives Shortness Of Breath    Flushing & dyspnea Because of a history of documented adverse serious drug reaction;Medi Alert bracelet  is recommended  . Niacin-Lovastatin Er Other (See Comments)    Dyspnea, flushing  . Ramipril     cough  . Atorvastatin Other (See Comments)    Myalgias & athralgias     Current Outpatient Prescriptions  Medication Sig Dispense Refill  . aspirin 81 MG chewable tablet Chew 1 tablet (81 mg total) by mouth daily.  20 tablet  0  . bimatoprost (LUMIGAN) 0.03 % ophthalmic solution Place 1 drop into both eyes at bedtime.      . brimonidine (ALPHAGAN P) 0.1 % SOLN Place 3 drops into the left eye 3 (three) times daily.       . clopidogrel (PLAVIX) 75 MG tablet Take 75 mg by mouth daily.       . dorzolamide-timolol (COSOPT) 22.3-6.8 MG/ML ophthalmic solution Place 2 drops into the right eye 2 (two) times daily.      [name] doxazosin (CARDURA) 4 MG tablet Take 2 mg by mouth daily.      [name] MELATONIN PO Take 1 tablet by mouth at bedtime as needed  (sleep).       . Multiple Vitamins-Minerals (CENTRUM SILVER PO) Take 1 tablet by mouth daily.      . naproxen sodium (ANAPROX) 220 MG tablet Take 440 mg by mouth daily as needed (pain).      . rosuvastatin (CRESTOR) 40 MG tablet  Take 20 mg by mouth every morning.      . sennosides-docusate sodium (SENOKOT-S) 8.6-50 MG tablet Take 1 tablet by mouth daily as needed for constipation.      . varenicline (CHANTIX) 1 MG tablet Take 1 mg by mouth 2 (two) times daily.      . cilostazol (PLETAL) 100 MG tablet Take 1 tablet (100 mg total) by mouth daily.  30 tablet  5  . tiotropium (SPIRIVA HANDIHALER) 18 MCG inhalation capsule Place 1 capsule (18 mcg total) into inhaler and inhale daily.  30 capsule  12   No current facility-administered medications for this visit.    ROS:   General:  No weight loss, Fever, chills  HEENT: No recent headaches, no nasal bleeding, no visual changes, no sore throat  Neurologic: No dizziness, blackouts, seizures. No recent symptoms of stroke or mini- stroke. No recent episodes of slurred speech, or temporary blindness.  Cardiac: No recent episodes of chest pain/pressure, no shortness of breath at rest.  + shortness of breath with exertion. + history of atrial fibrillation or irregular heartbeat  Vascular: No history of rest pain in feet.  No history of claudication.  No history of non-healing ulcer, No history of DVT   Pulmonary: No home oxygen, + productive cough, no hemoptysis,  No asthma or wheezing  Musculoskeletal:  [ ]  Arthritis, [ ]  Low back pain,  [ ]  Joint pain  Hematologic:No history of hypercoagulable state.  No history of easy bleeding.  No history of anemia  Gastrointestinal: No hematochezia or melena,  No gastroesophageal reflux, no trouble swallowing  Urinary: [ ]  chronic Kidney disease, [ ]  on HD - [ ]  MWF or [ ]  TTHS, [ ]  Burning with urination, [ ]  Frequent urination, [ ]  Difficulty urinating;   Skin: No rashes  Psychological: No history of  anxiety,  No history of depression   Physical Examination  Filed Vitals:   07/09/13 1321 07/09/13 1328  BP: 106/57 105/61  Pulse: 100   Height: 6\' 1"  (1.854 m)   Weight: 198 lb 3.2 oz (89.903 kg)   SpO2: 100%     Body mass index is 26.16 kg/(m^2).  General:  Alert and oriented, no acute distress HEENT: Normal Neck: No bruit or JVD Pulmonary: Clear to auscultation bilaterally Cardiac: Irregularly irregular Rate and Rhythm without murmur Abdomen: Soft, non-tender, non-distended, no mass Skin: No rash Extremity Pulses:  2+ radial, brachial, femoral pulses bilaterally Musculoskeletal: No deformity or edema  Neurologic: Upper and lower extremity motor 5/5 and symmetric  DATA:  Carotid duplex scan was performed at [facility] on October 7. This showed 60-80% left internal carotid artery stenosis greater than 80% right internal carotid artery stenosis vertebral antegrade flow. We repeated his right carotid duplex in our office today for operative planning purposes. This suggested that the distal internal carotid artery was not completely normal with abnormal flow within it.   ASSESSMENT:  Symptomatic right internal carotid artery stenosis but the distal extent of disease cannot be determined by duplex scan   PLAN:  Arch and carotid angiogram tomorrow to determine distal extent of occlusive disease right neck. Operative planning will be determined based on the arteriographic findings. Risks benefits possible complications and procedure details were explained to the patient and his wife today. He understands these include but are not limited to contrast reaction bleeding from the vessel possible stroke. He also has requested if possible we can test his blood to see if he responds to Plavix.  We'll also review his electrocardiogram tomorrow for his irregular heartbeat  Fabienne Bruns, MD [facility] [place] Office: [phone] Pager: [phone]

## 2013-07-09 NOTE — Patient Instructions (Addendum)
Monitor cough & BP off Ramipril ; goal = average < 140/90 , optimally < 135/85. Please think about quitting smoking. Review the risks we discussed. Please call 1-800-QUIT-NOW [phone]) for free smoking cessation counseling.

## 2013-07-09 NOTE — Progress Notes (Signed)
  Subjective:    Patient ID: Troy [name], male    DOB: 20-Oct-1940, 72 y.o.   MRN: [id number]  HPI   The complex hospital record 07-06-09-7/14 was reviewed and summarized in the problem list under TIA.  There is a question of paroxysmal atrial fibrillation while hospitalized. EKG is reviewed do show PACs. There was also concern of hypoxia.component. He was told to decrease caffeine post discharge.  Carotid angiogram is scheduled for 10/10.  Since discharge he has had slight lightheadedness and intermittent "flap" of the left upper extremity.    Review of Systems    He has significant exertional dyspnea; he has quit climbing stairs and is limited in activities such as hunting. He states this is stable over past year; his wife states worse in past 3 mos. The cough is mainly dry.  Smoking history was reviewed; he has a 68 pack year history. He had a right lower lobectomy in 2012. CT of lung shows residual fibro-thorax.  He is on a low-dose ACE inhibitor. This was prescribed by his cardiologist for "high blood pressure" (see present blood pressure).  He is concerned about his inhalers possibly aggravating glaucoma. He will discuss this with the pharmacist/and     Objective:   Physical Exam Gen.: Adequately nourished in appearance. Alert, appropriate and cooperative throughout exam. Eyes: No corneal or conjunctival inflammation noted.  Nose: External nasal exam reveals no deformity or inflammation. Nasal mucosa are pink and moist. No lesions or exudates noted.   Mouth: Oral mucosa and oropharynx reveal no lesions or exudates. Teeth in good repair. Neck: No deformities, masses, or tenderness noted.  Lungs: Normal respiratory effort; chest expands symmetrically. Lungs are clear to auscultation without rales, wheezes, or increased work of breathing. Heart: Normal rate and rhythm. Normal S1 and S2. No gallop, click, or rub. No murmur. Abdomen: Bowel sounds normal; abdomen soft and nontender.  No masses, organomegaly or hernias noted.                                   Musculoskeletal/extremities: No clubbing, cyanosis, edema, or significant extremity  deformity noted. Range of motion normal .Tone & strength  Normal. Joints normal. Nail health good. Able to lie down & sit up w/o help. Negative SLR bilaterally Vascular: Carotid, radial artery, dorsalis pedis and  posterior tibial pulses are  equal.  Decreased PTP.No bruits present. Neurologic: Alert and oriented x3.         Skin: Intact without suspicious lesions or rashes. Lymph: No cervical, axillary lymphadenopathy present. Psych: Mood and affect are normal. Normally interactive                                                                                        Assessment & Plan:  #1 TIA #2 DOE ,multifaceted .PFTs reviewed & discussed. COPD added to problem list #3 cough on low dose ACE-I See recommendations

## 2013-07-10 ENCOUNTER — Other Ambulatory Visit: Payer: Self-pay | Admitting: *Deleted

## 2013-07-10 ENCOUNTER — Ambulatory Visit (HOSPITAL_COMMUNITY)
Admission: RE | Admit: 2013-07-10 | Discharge: 2013-07-10 | Disposition: A | Discharge status: Home / Self Care | Payer: BC Managed Care – PPO | Source: Ambulatory Visit | Attending: Vascular Surgery | Admitting: Vascular Surgery

## 2013-07-10 ENCOUNTER — Encounter (HOSPITAL_COMMUNITY)
Admission: RE | Disposition: A | Discharge status: Home / Self Care | Payer: Self-pay | Source: Ambulatory Visit | Attending: Vascular Surgery

## 2013-07-10 DIAGNOSIS — K219 Gastro-esophageal reflux disease without esophagitis: Secondary | ICD-10-CM | POA: Insufficient documentation

## 2013-07-10 DIAGNOSIS — H409 Unspecified glaucoma: Secondary | ICD-10-CM | POA: Insufficient documentation

## 2013-07-10 DIAGNOSIS — I10 Essential (primary) hypertension: Secondary | ICD-10-CM | POA: Insufficient documentation

## 2013-07-10 DIAGNOSIS — J4489 Other specified chronic obstructive pulmonary disease: Secondary | ICD-10-CM | POA: Insufficient documentation

## 2013-07-10 DIAGNOSIS — I6529 Occlusion and stenosis of unspecified carotid artery: Secondary | ICD-10-CM

## 2013-07-10 DIAGNOSIS — I714 Abdominal aortic aneurysm, without rupture, unspecified: Secondary | ICD-10-CM | POA: Insufficient documentation

## 2013-07-10 DIAGNOSIS — E785 Hyperlipidemia, unspecified: Secondary | ICD-10-CM | POA: Insufficient documentation

## 2013-07-10 DIAGNOSIS — J449 Chronic obstructive pulmonary disease, unspecified: Secondary | ICD-10-CM | POA: Insufficient documentation

## 2013-07-10 DIAGNOSIS — Z85828 Personal history of other malignant neoplasm of skin: Secondary | ICD-10-CM | POA: Insufficient documentation

## 2013-07-10 HISTORY — PX: CAROTID ANGIOGRAM: SHX5504

## 2013-07-10 LAB — COMPREHENSIVE METABOLIC PANEL
ALT: 11 U/L (ref 0–53)
Alkaline Phosphatase: 76 U/L (ref 39–117)
BUN: 17 mg/dL (ref 6–23)
CO2: 26 mEq/L (ref 19–32)
Calcium: 9.3 mg/dL (ref 8.4–10.5)
Chloride: 104 mEq/L (ref 96–112)
Creatinine, Ser: 1.11 mg/dL (ref 0.50–1.35)
GFR calc Af Amer: 75 mL/min — ABNORMAL LOW (ref >90)
GFR calc non Af Amer: 64 mL/min — ABNORMAL LOW (ref >90)
Glucose, Bld: 104 mg/dL — ABNORMAL HIGH (ref 70–99)
Potassium: 5.2 mEq/L — ABNORMAL HIGH (ref 3.5–5.1)
Sodium: 138 mEq/L (ref 135–145)
Total Bilirubin: 0.4 mg/dL (ref 0.3–1.2)

## 2013-07-10 LAB — APTT: aPTT: 30 seconds (ref 24–37)

## 2013-07-10 LAB — URINALYSIS, ROUTINE W REFLEX MICROSCOPIC
Bilirubin Urine: NEGATIVE
Glucose, UA: NEGATIVE mg/dL
Hgb urine dipstick: NEGATIVE
Nitrite: NEGATIVE
Specific Gravity, Urine: 1.028 (ref 1.005–1.030)
Urobilinogen, UA: 1 mg/dL (ref 0.0–1.0)
pH: 7.5 (ref 5.0–8.0)

## 2013-07-10 LAB — POCT I-STAT, CHEM 8
BUN: 19 mg/dL (ref 6–23)
Chloride: 106 mEq/L (ref 96–112)
Potassium: 4.2 mEq/L (ref 3.5–5.1)
Sodium: 139 mEq/L (ref 135–145)
TCO2: 23 mmol/L (ref 0–100)

## 2013-07-10 LAB — CBC
HCT: 37.9 % — ABNORMAL LOW (ref 39.0–52.0)
Hemoglobin: 12.5 g/dL — ABNORMAL LOW (ref 13.0–17.0)
RBC: 4.37 MIL/uL (ref 4.22–5.81)
RDW: 13.6 % (ref 11.5–15.5)
WBC: 6.3 10*3/uL (ref 4.0–10.5)

## 2013-07-10 LAB — PROTIME-INR
INR: 1.08 (ref 0.00–1.49)
Prothrombin Time: 13.8 seconds (ref 11.6–15.2)

## 2013-07-10 LAB — TYPE AND SCREEN: Antibody Screen: NEGATIVE

## 2013-07-10 LAB — PLATELET INHIBITION P2Y12: Platelet Function  P2Y12: 126 [PRU] — ABNORMAL LOW (ref 194–418)

## 2013-07-10 LAB — ABO/RH: ABO/RH(D): A POS

## 2013-07-10 SURGERY — CAROTID ANGIOGRAM
Anesthesia: LOCAL

## 2013-07-10 MED ORDER — MORPHINE SULFATE 10 MG/ML IJ SOLN: 2.0000 mg | INTRAMUSCULAR | Status: DC | PRN

## 2013-07-10 MED ORDER — HYDRALAZINE HCL 20 MG/ML IJ SOLN: 10.0000 mg | INTRAMUSCULAR | Status: DC | PRN

## 2013-07-10 MED ORDER — SODIUM CHLORIDE 0.45 % IV SOLN
INTRAVENOUS | Status: DC
  Administered 2013-07-10: 10:00:00 via INTRAVENOUS

## 2013-07-10 MED ORDER — METOPROLOL TARTRATE 1 MG/ML IV SOLN: 2.0000 mg | INTRAVENOUS | Status: DC | PRN

## 2013-07-10 MED ORDER — SODIUM CHLORIDE 0.9 % IV SOLN
INTRAVENOUS | Status: DC
  Administered 2013-07-10: 08:00:00 via INTRAVENOUS

## 2013-07-10 MED ORDER — ACETAMINOPHEN 325 MG PO TABS: 325.0000 mg | ORAL_TABLET | ORAL | Status: DC | PRN

## 2013-07-10 MED ORDER — ACETAMINOPHEN 325 MG RE SUPP: 325.0000 mg | RECTAL | Status: DC | PRN

## 2013-07-10 MED ORDER — LABETALOL HCL 5 MG/ML IV SOLN: 10.0000 mg | INTRAVENOUS | Status: DC | PRN

## 2013-07-10 MED ORDER — CILOSTAZOL 100 MG PO TABS: 100.0000 mg | ORAL_TABLET | Freq: Every day | ORAL | Status: DC

## 2013-07-10 MED ORDER — LIDOCAINE HCL (PF) 1 % IJ SOLN
INTRAMUSCULAR | Status: AC
  Filled 2013-07-10: qty 30

## 2013-07-10 MED ORDER — HEPARIN (PORCINE) IN NACL 2-0.9 UNIT/ML-% IJ SOLN
INTRAMUSCULAR | Status: AC
  Filled 2013-07-10: qty 1000

## 2013-07-10 MED ORDER — ONDANSETRON HCL 4 MG/2ML IJ SOLN: 4.0000 mg | Freq: Four times a day (QID) | INTRAMUSCULAR | Status: DC | PRN

## 2013-07-10 NOTE — Op Note (Addendum)
Procedure: Arch aortogram with bilateral carotid angiogram Preoperative diagnosis: High grade symtomatic right internal carotid artery stenosis  Postoperative diagnosis: Same  Anesthesia: Local   Operative details: After obtaining informed consent, the patient was taken to the PV lab. The patient was placed in supine position the Angio table. Both groins were prepped and draped in usual sterile fashion. Local anesthesia was infiltrated over the right common femoral artery. Ultrasound was used to identify the right common femoral artery.  An introducer needle was placed into the right common femoral artery using ultrasound guidance and there was good backbleeding from this.  Next and 0.035 Versacore wire was threaded up into the abdominal aorta and into the aortic arch under fluoroscopic guidance. A 5 French sheath was  the guidewire the right common femoral artery. This was thoroughly flushed with heparinized saline. A 5 French pigtail catheter was then placed through the guidewire advanced up in the aortic arch and arch aortogram obtained in a 40 LAO view. This shows normal arch configuration with no stenosis of the right subclavian artery. The right vertebral artery is patent with antegrade flow. The innominate artery is patent. The right common carotid artery is patent. The left common carotid artery is patent. The left subclavian artery is patent. The left vertebral artery is patent.  The 5 French pigtail catheter was then pulled back over the guidewire and exchanged for a Berenstein 2 catheter but this would not engage the innominate due to tortuosity.  A simmons 1 catheter was advanced over the wire and formed in the ascending aorta.  This was then used to selectively catheterize the innominate artery followed by the right common carotid.Injection was then performed through the right common carotid to define the right carotid system. Projections were done in AP and lateral projection. Intracranial  views were also performed to be interpreted later by the neuroradiologist. The views showed a patent right carotid bifurcation. The right internal carotid has a 90% stenosis and the external carotid is widely patent.  The bifucation is just above the hyoid.  Next the [facility] catheter was pulled back over guidewire and I was able to advance this into the left common carotid artery.  After advancing the catheter into the left common carotid artery left carotid injection was performed also in AP and lateral projection. Intracranial views were also performed in AP and lateral projection to be interpreted by the neuroradiologist. On the left side the left carotid bifurcation is patent. The left internal carotid is narrowed 50% and the external carotid artery is widely patent.  Next, the [facility] catheter was pulled back over the guidewire and out. The 5 French sheath was thoroughly flushed with heparinized saline. The patient was taken to the holding area in stable condition.  The patient tolerated the procedure well and there were no neurologic complications. The patient was transported to the holding area in stable condition.   Operative findings: 90% right ICA stenosis with suitable distal ICA for CEA  Disposition: Pt scheduled for right CEA Dr Edilia Bo 07/14/13, Tuesday.  We will stop his Plavix today and continue aspirin.   Fabienne Bruns, MD  [facility] [place]  Office: [phone]  Pager: [phone]

## 2013-07-10 NOTE — Interval H&P Note (Signed)
History and Physical Interval Note:  07/10/2013 9:25 AM  Troy [name]  has presented today for surgery, with the diagnosis of pvd  The various methods of treatment have been discussed with the patient and family. After consideration of risks, benefits and other options for treatment, the patient has consented to  Procedure(s): CAROTID ANGIOGRAM (N/A) as a surgical intervention .  The patient's history has been reviewed, patient examined, no change in status, stable for surgery.  I have reviewed the patient's chart and labs.  Questions were answered to the patient's satisfaction.     FIELDS,CHARLES E

## 2013-07-10 NOTE — H&P (View-Only) (Signed)
[facility] [place] HISTORY AND PHYSICAL   History of Present Illness:  Patient is a 72 y.o. year old male who presents for evaluation of symptomatic right internal carotid artery stenosis.  The patient had an episode of presyncope dizziness left hand ataxia and left leg weakness which occurred 3 days ago. He also had some purple patches in his right eye. This resolved over several hours. The patient denies prior history or stroke. He denies prior history of TIA or amaurosis. The patient has been followed for many years by Dr. Dickson for peripheral arterial disease and a small abdominal aortic aneurysm. He is on a statin aspirin and Plavix. He had a fairly normal stress test and Echo in March of this year. This was done by Dr. Maclean.  He does have a history of some ectopic beats. He has a remote history of atrial fibrillation. Other medical problems include hypertension hyperlipidemia glaucoma abdominal aortic aneurysm (3.4 cm in January 2014) and remote history of lung cancer all of which are currently.  He is a smoker but is currently on Chantix trying to quit.  Past Medical History  Diagnosis Date  . Hypertension   . Hyperlipidemia   . Glaucoma BOTH EYES    Dr Shapiro  . Hx of adenomatous colonic polyps 2005     X 2; 1 hyperplastic polyp; Dr Brodie  . Impaired fasting glucose 2007    108; A1c5.4%  . History of lung cancer APRIL 2012  SQUAMOUS CELL---- S/P RIGHT LOWER LOBECTOMY AT [facility] --  NO CHEMORADIATION---  NO RECURRENCE     ONCOLOGIST- DR NEIJSTROM  LOV IN EPIC 10-27-2012  . GERD (gastroesophageal reflux disease)   . BPH (benign prostatic hypertrophy)   . Frequency of urination   . Urgency of urination   . Nocturia   . Lesion of bladder   . Microhematuria   . Arthritis   . History of basal cell carcinoma excision   . Exertional dyspnea   . Peripheral vascular disease S/P ANGIOPLASTY AND STENTING    FOLLOWED  BY DR DICKSON  . PAD (peripheral artery disease)  ABI'S  JAN 2014  0.65 ON RIGHT ;  1.04 ON LEFT  . AAA (abdominal aortic aneurysm) LAST ABD. US  JAN 2014  3.4CM    MONITORED BY DR DICKSON  . Bilateral carotid artery stenosis DUPLEX 12-29-2012  BY DR MCLEAN    BILATERAL ICA STENOSIS 60-79%  . COPD (chronic obstructive pulmonary disease)     Past Surgical History  Procedure Laterality Date  . Angio plasty      X 4 in legs  . Colon polyectomy    . Trabecular surgery      OS  . Lung lobectomy  01/24/2011     RIGHT UPPER LOBE  (SQUAMOUS CELL CARCINOMA) Dr D'imico , [facility]. No chemotherapyor radiation  . Laryngoscopy  06-27-2004    BX VOCAL CORD  (LEUKOPLAKIA)  PER PT NO ISSUES SINCE  . Cataract extraction w/ intraocular lens  implant, bilateral    . Basal cell carcinoma excision      MULTIPLE TIMES--  RIGHT FOREARM, CHEEKS, AND BACK  . Femoral-popliteal bypass graft Left 1994  [facility]    AND 2001  IN [place]  . Cardiovascular stress test  12-08-2012  DR MCLEAN    NORMAL LEXISCAN WITH NO EXERCISE NUCLEAR STUDY/ EF 66%/   NO ISCHEMIA/ NO SIGNIFICANT CHANGE FROM PRIOR STUDY  . Transthoracic echocardiogram  12-29-2012  DR MCLEAN      MILD LVH/  LVSF NORMAL/ EF 55-60%/  GRADE I DIASTOLIC DYSFUNCTION  . Aortogram  07-27-2002    MILD DIFFUSE ILIAC ARTERY OCCLUSIVE DISEASE /  LEFT RENAL ARTERY 20%/ PATENT LEFT FEM-POP GRAFT/ MILD SFA AND POPLITEAL ARTERY OCCLUSIVE DISEASE W/ SEVERE KIDNEY OCCLUSIVE DISEASE  . Cystoscopy w/ retrogrades Bilateral 01/21/2013    Procedure: CYSTOSCOPY WITH RETROGRADE PYELOGRAM;  Surgeon: Daniel Young Woodruff, MD;  Location: [facility];  Service: Urology;  Laterality: Bilateral;   CYSTO, BLADDER BIOPSY, BILATERAL RETROGRADE PYELOGRAM  RAD TECH FROM RADIOLOGY PER JOY  . Cystoscopy with biopsy N/A 01/21/2013    Procedure: CYSTOSCOPY WITH BIOPSY;  Surgeon: Daniel Young Woodruff, MD;  Location: [facility];  Service: Urology;  Laterality: N/A;    Social History History  Substance Use  Topics  . Smoking status: Current Every Day Smoker -- 0.50 packs/day for 50 years    Types: Cigarettes  . Smokeless tobacco: Never Used     Comment: pt states that he is now taking chantix  . Alcohol Use: Yes     Comment:  socially, variable    Family History Family History  Problem Relation Age of Onset  . Stroke Mother     mini strokes  . Alcohol abuse Father   . Heart disease Father     MI after 55  . Stroke Father   . Hypertension Father   . Heart attack Father   . Heart disease Paternal Aunt     several  . Hypertension Paternal Aunt     several  . Stroke Paternal Aunt     several  . Stroke Paternal Uncle     several  . Heart disease Paternal Uncle     several;2 had MI pre 55    Allergies  Allergies  Allergen Reactions  . Penicillins Shortness Of Breath    Flushing  & dyspnea Because of a history of documented adverse serious drug reaction;Medi Alert bracelet  is recommended  . Sulfonamide Derivatives Shortness Of Breath    Flushing & dyspnea Because of a history of documented adverse serious drug reaction;Medi Alert bracelet  is recommended  . Niacin-Lovastatin Er Other (See Comments)    Dyspnea, flushing  . Ramipril     cough  . Atorvastatin Other (See Comments)    Myalgias & athralgias     Current Outpatient Prescriptions  Medication Sig Dispense Refill  . aspirin 81 MG chewable tablet Chew 1 tablet (81 mg total) by mouth daily.  20 tablet  0  . bimatoprost (LUMIGAN) 0.03 % ophthalmic solution Place 1 drop into both eyes at bedtime.      . brimonidine (ALPHAGAN P) 0.1 % SOLN Place 3 drops into the left eye 3 (three) times daily.       . clopidogrel (PLAVIX) 75 MG tablet Take 75 mg by mouth daily.       . dorzolamide-timolol (COSOPT) 22.3-6.8 MG/ML ophthalmic solution Place 2 drops into the right eye 2 (two) times daily.      . doxazosin (CARDURA) 4 MG tablet Take 2 mg by mouth daily.      . MELATONIN PO Take 1 tablet by mouth at bedtime as needed  (sleep).       . Multiple Vitamins-Minerals (CENTRUM SILVER PO) Take 1 tablet by mouth daily.      . naproxen sodium (ANAPROX) 220 MG tablet Take 440 mg by mouth daily as needed (pain).      . rosuvastatin (CRESTOR) 40 MG tablet   Take 20 mg by mouth every morning.      . sennosides-docusate sodium (SENOKOT-S) 8.6-50 MG tablet Take 1 tablet by mouth daily as needed for constipation.      . varenicline (CHANTIX) 1 MG tablet Take 1 mg by mouth 2 (two) times daily.      . cilostazol (PLETAL) 100 MG tablet Take 1 tablet (100 mg total) by mouth daily.  30 tablet  5  . tiotropium (SPIRIVA HANDIHALER) 18 MCG inhalation capsule Place 1 capsule (18 mcg total) into inhaler and inhale daily.  30 capsule  12   No current facility-administered medications for this visit.    ROS:   General:  No weight loss, Fever, chills  HEENT: No recent headaches, no nasal bleeding, no visual changes, no sore throat  Neurologic: No dizziness, blackouts, seizures. No recent symptoms of stroke or mini- stroke. No recent episodes of slurred speech, or temporary blindness.  Cardiac: No recent episodes of chest pain/pressure, no shortness of breath at rest.  + shortness of breath with exertion. + history of atrial fibrillation or irregular heartbeat  Vascular: No history of rest pain in feet.  No history of claudication.  No history of non-healing ulcer, No history of DVT   Pulmonary: No home oxygen, + productive cough, no hemoptysis,  No asthma or wheezing  Musculoskeletal:  [ ] Arthritis, [ ] Low back pain,  [ ] Joint pain  Hematologic:No history of hypercoagulable state.  No history of easy bleeding.  No history of anemia  Gastrointestinal: No hematochezia or melena,  No gastroesophageal reflux, no trouble swallowing  Urinary: [ ] chronic Kidney disease, [ ] on HD - [ ] MWF or [ ] TTHS, [ ] Burning with urination, [ ] Frequent urination, [ ] Difficulty urinating;   Skin: No rashes  Psychological: No history of  anxiety,  No history of depression   Physical Examination  Filed Vitals:   07/09/13 1321 07/09/13 1328  BP: 106/57 105/61  Pulse: 100   Height: 6' 1" (1.854 m)   Weight: 198 lb 3.2 oz (89.903 kg)   SpO2: 100%     Body mass index is 26.16 kg/(m^2).  General:  Alert and oriented, no acute distress HEENT: Normal Neck: No bruit or JVD Pulmonary: Clear to auscultation bilaterally Cardiac: Irregularly irregular Rate and Rhythm without murmur Abdomen: Soft, non-tender, non-distended, no mass Skin: No rash Extremity Pulses:  2+ radial, brachial, femoral pulses bilaterally Musculoskeletal: No deformity or edema  Neurologic: Upper and lower extremity motor 5/5 and symmetric  DATA:  Carotid duplex scan was performed at [facility] [facility] on October 7. This showed 60-80% left internal carotid artery stenosis greater than 80% right internal carotid artery stenosis vertebral antegrade flow. We repeated his right carotid duplex in our office today for operative planning purposes. This suggested that the distal internal carotid artery was not completely normal with abnormal flow within it.   ASSESSMENT:  Symptomatic right internal carotid artery stenosis but the distal extent of disease cannot be determined by duplex scan   PLAN:  Arch and carotid angiogram tomorrow to determine distal extent of occlusive disease right neck. Operative planning will be determined based on the arteriographic findings. Risks benefits possible complications and procedure details were explained to the patient and his wife today. He understands these include but are not limited to contrast reaction bleeding from the vessel possible stroke. He also has requested if possible we can test his blood to see if he responds to Plavix.   We'll also review his electrocardiogram tomorrow for his irregular heartbeat  Charles Fields, MD [facility] [place] Office: [phone] Pager: [phone]   

## 2013-07-10 NOTE — Telephone Encounter (Signed)
Pletal refill sent to pharmacy, #30, 0 refills. Patient needs office visit.

## 2013-07-13 ENCOUNTER — Encounter (HOSPITAL_COMMUNITY): Payer: Self-pay | Admitting: Pharmacy Technician

## 2013-07-13 ENCOUNTER — Encounter (HOSPITAL_COMMUNITY): Payer: Self-pay | Admitting: *Deleted

## 2013-07-13 MED ORDER — SODIUM CHLORIDE 0.9 % IV SOLN
1500.0000 mg | INTRAVENOUS | Status: AC
  Administered 2013-07-14: 1500 mg via INTRAVENOUS
  Filled 2013-07-13 (×3): qty 1500

## 2013-07-13 NOTE — Pre-Procedure Instructions (Signed)
KY RUMPLE  07/13/2013   Your procedure is scheduled on:  Tuesday, July 14, 2013 at 9:30 AM.   Report to [facility] Entrance "A" at 6:30 AM. Stop in the Admitting office to be checked in.   Call this number if you have problems the morning of surgery: [phone]   Remember:   Do not eat food or drink liquids after midnight tonight, 07/13/13.   Take these medicines the morning of surgery with A SIP OF WATER: eye drops, Spiriva and Cardura.   Do not wear jewelry.  Do not wear lotions, powders, or cologne. You may wear deodorant.  Do not shave 48 hours prior to surgery. Men may shave face and neck.  Do not bring valuables to the hospital.  [facility] is not responsible                  for any belongings or valuables.               Contacts, dentures or bridgework may not be worn into surgery.  Leave suitcase in the car. After surgery it may be brought to your room.  For patients admitted to the hospital, discharge time is determined by your                treatment team.

## 2013-07-13 NOTE — Progress Notes (Signed)
Pt's wife requested that I send the pre-op information to her via email. Sent today at 10:38 to [email or web address].

## 2013-07-14 ENCOUNTER — Telehealth: Payer: Self-pay | Admitting: Vascular Surgery

## 2013-07-14 ENCOUNTER — Inpatient Hospital Stay (HOSPITAL_COMMUNITY)
Admission: RE | Admit: 2013-07-14 | Discharge: 2013-07-15 | DRG: 838 | Disposition: A | Discharge status: Home / Self Care | Payer: BC Managed Care – PPO | Source: Ambulatory Visit | Attending: Vascular Surgery | Admitting: Vascular Surgery

## 2013-07-14 ENCOUNTER — Encounter (HOSPITAL_COMMUNITY): Payer: Self-pay | Admitting: Anesthesiology

## 2013-07-14 ENCOUNTER — Encounter (HOSPITAL_COMMUNITY): Payer: BC Managed Care – PPO | Admitting: Anesthesiology

## 2013-07-14 ENCOUNTER — Encounter (HOSPITAL_COMMUNITY)
Admission: RE | Disposition: A | Discharge status: Home / Self Care | Payer: Self-pay | Source: Ambulatory Visit | Attending: Vascular Surgery

## 2013-07-14 ENCOUNTER — Inpatient Hospital Stay (HOSPITAL_COMMUNITY): Payer: BC Managed Care – PPO

## 2013-07-14 ENCOUNTER — Inpatient Hospital Stay (HOSPITAL_COMMUNITY): Payer: BC Managed Care – PPO | Admitting: Anesthesiology

## 2013-07-14 DIAGNOSIS — I714 Abdominal aortic aneurysm, without rupture, unspecified: Secondary | ICD-10-CM | POA: Diagnosis present

## 2013-07-14 DIAGNOSIS — Z79899 Other long term (current) drug therapy: Secondary | ICD-10-CM

## 2013-07-14 DIAGNOSIS — Z7902 Long term (current) use of antithrombotics/antiplatelets: Secondary | ICD-10-CM

## 2013-07-14 DIAGNOSIS — E785 Hyperlipidemia, unspecified: Secondary | ICD-10-CM | POA: Diagnosis present

## 2013-07-14 DIAGNOSIS — I6529 Occlusion and stenosis of unspecified carotid artery: Principal | ICD-10-CM | POA: Diagnosis present

## 2013-07-14 DIAGNOSIS — Z7982 Long term (current) use of aspirin: Secondary | ICD-10-CM

## 2013-07-14 DIAGNOSIS — I129 Hypertensive chronic kidney disease with stage 1 through stage 4 chronic kidney disease, or unspecified chronic kidney disease: Secondary | ICD-10-CM | POA: Diagnosis present

## 2013-07-14 DIAGNOSIS — K219 Gastro-esophageal reflux disease without esophagitis: Secondary | ICD-10-CM | POA: Diagnosis present

## 2013-07-14 DIAGNOSIS — Z85828 Personal history of other malignant neoplasm of skin: Secondary | ICD-10-CM

## 2013-07-14 DIAGNOSIS — F17200 Nicotine dependence, unspecified, uncomplicated: Secondary | ICD-10-CM | POA: Diagnosis present

## 2013-07-14 DIAGNOSIS — I4891 Unspecified atrial fibrillation: Secondary | ICD-10-CM | POA: Diagnosis present

## 2013-07-14 DIAGNOSIS — Z85118 Personal history of other malignant neoplasm of bronchus and lung: Secondary | ICD-10-CM

## 2013-07-14 DIAGNOSIS — I739 Peripheral vascular disease, unspecified: Secondary | ICD-10-CM | POA: Diagnosis present

## 2013-07-14 DIAGNOSIS — J4489 Other specified chronic obstructive pulmonary disease: Secondary | ICD-10-CM | POA: Diagnosis present

## 2013-07-14 DIAGNOSIS — N183 Chronic kidney disease, stage 3 unspecified: Secondary | ICD-10-CM | POA: Diagnosis present

## 2013-07-14 DIAGNOSIS — J449 Chronic obstructive pulmonary disease, unspecified: Secondary | ICD-10-CM | POA: Diagnosis present

## 2013-07-14 HISTORY — PX: PATCH ANGIOPLASTY: SHX6230

## 2013-07-14 HISTORY — PX: CAROTID ENDARTERECTOMY: SUR193

## 2013-07-14 HISTORY — PX: ENDARTERECTOMY: SHX5162

## 2013-07-14 LAB — CBC
HCT: 35 % — ABNORMAL LOW (ref 39.0–52.0)
Hemoglobin: 11.6 g/dL — ABNORMAL LOW (ref 13.0–17.0)
MCH: 28.7 pg (ref 26.0–34.0)
MCHC: 33.1 g/dL (ref 30.0–36.0)
MCV: 86.6 fL (ref 78.0–100.0)
Platelets: 255 10*3/uL (ref 150–400)
RBC: 4.04 MIL/uL — ABNORMAL LOW (ref 4.22–5.81)
WBC: 8.7 10*3/uL (ref 4.0–10.5)

## 2013-07-14 LAB — SURGICAL PCR SCREEN
MRSA, PCR: NEGATIVE
Staphylococcus aureus: NEGATIVE

## 2013-07-14 LAB — CREATININE, SERUM
GFR calc Af Amer: >90 mL/min (ref >90)
GFR calc non Af Amer: 80 mL/min — ABNORMAL LOW (ref >90)

## 2013-07-14 SURGERY — ENDARTERECTOMY, CAROTID
Anesthesia: General | Site: Neck | Laterality: Right

## 2013-07-14 MED ORDER — HYDRALAZINE HCL 20 MG/ML IJ SOLN: 10.0000 mg | INTRAMUSCULAR | Status: DC | PRN

## 2013-07-14 MED ORDER — MUPIROCIN 2 % EX OINT
TOPICAL_OINTMENT | Freq: Once | CUTANEOUS | Status: AC
  Administered 2013-07-14: 07:00:00 via NASAL

## 2013-07-14 MED ORDER — ASPIRIN 81 MG PO CHEW
81.0000 mg | CHEWABLE_TABLET | Freq: Every day | ORAL | Status: DC
  Administered 2013-07-15: 81 mg via ORAL
  Filled 2013-07-14: qty 1

## 2013-07-14 MED ORDER — DOCUSATE SODIUM 100 MG PO CAPS
100.0000 mg | ORAL_CAPSULE | Freq: Every day | ORAL | Status: DC
  Administered 2013-07-15: 100 mg via ORAL
  Filled 2013-07-14: qty 1

## 2013-07-14 MED ORDER — NEOSTIGMINE METHYLSULFATE 1 MG/ML IJ SOLN
INTRAMUSCULAR | Status: DC | PRN
  Administered 2013-07-14: 5 mg via INTRAVENOUS

## 2013-07-14 MED ORDER — ARTIFICIAL TEARS OP OINT
TOPICAL_OINTMENT | OPHTHALMIC | Status: DC | PRN
  Administered 2013-07-14: 1 via OPHTHALMIC

## 2013-07-14 MED ORDER — BRIMONIDINE TARTRATE 0.1 % OP SOLN
3.0000 [drp] | Freq: Three times a day (TID) | OPHTHALMIC | Status: DC
  Administered 2013-07-14 – 2013-07-15 (×2): 3 [drp] via OPHTHALMIC

## 2013-07-14 MED ORDER — ACETAMINOPHEN 325 MG PO TABS
325.0000 mg | ORAL_TABLET | ORAL | Status: DC | PRN
  Administered 2013-07-14 (×2): 650 mg via ORAL
  Filled 2013-07-14 (×3): qty 2

## 2013-07-14 MED ORDER — MAGNESIUM SULFATE 40 MG/ML IJ SOLN
2.0000 g | Freq: Once | INTRAMUSCULAR | Status: AC | PRN
  Filled 2013-07-14: qty 50

## 2013-07-14 MED ORDER — SODIUM CHLORIDE 0.9 % IV SOLN
10.0000 mg | INTRAVENOUS | Status: DC | PRN
  Administered 2013-07-14: 20 ug/min via INTRAVENOUS

## 2013-07-14 MED ORDER — SENNOSIDES-DOCUSATE SODIUM 8.6-50 MG PO TABS
1.0000 | ORAL_TABLET | Freq: Every day | ORAL | Status: DC | PRN
  Filled 2013-07-14: qty 1

## 2013-07-14 MED ORDER — SODIUM CHLORIDE 0.9 % IV SOLN
INTRAVENOUS | Status: DC | PRN
  Administered 2013-07-14: 10:00:00 via INTRAVENOUS

## 2013-07-14 MED ORDER — BIMATOPROST 0.03 % OP SOLN
1.0000 [drp] | Freq: Every day | OPHTHALMIC | Status: DC
  Administered 2013-07-14: 1 [drp] via OPHTHALMIC

## 2013-07-14 MED ORDER — THROMBIN 20000 UNITS EX SOLR
CUTANEOUS | Status: AC
  Filled 2013-07-14: qty 20000

## 2013-07-14 MED ORDER — DEXAMETHASONE SODIUM PHOSPHATE 4 MG/ML IJ SOLN
INTRAMUSCULAR | Status: DC | PRN
  Administered 2013-07-14: 8 mg via INTRAVENOUS

## 2013-07-14 MED ORDER — SENNA-DOCUSATE SODIUM 8.6-50 MG PO TABS: 1.0000 | ORAL_TABLET | Freq: Every day | ORAL | Status: DC | PRN

## 2013-07-14 MED ORDER — LIDOCAINE HCL (PF) 1 % IJ SOLN
INTRAMUSCULAR | Status: AC
  Filled 2013-07-14: qty 30

## 2013-07-14 MED ORDER — SODIUM CHLORIDE 0.9 % IV SOLN: INTRAVENOUS | Status: DC

## 2013-07-14 MED ORDER — OXYCODONE HCL 5 MG PO TABS: 5.0000 mg | ORAL_TABLET | ORAL | Status: DC | PRN

## 2013-07-14 MED ORDER — LATANOPROST 0.005 % OP SOLN
1.0000 [drp] | Freq: Every day | OPHTHALMIC | Status: DC
  Filled 2013-07-14: qty 2.5

## 2013-07-14 MED ORDER — HYDROMORPHONE HCL PF 1 MG/ML IJ SOLN
INTRAMUSCULAR | Status: AC
  Filled 2013-07-14: qty 1

## 2013-07-14 MED ORDER — GUAIFENESIN-DM 100-10 MG/5ML PO SYRP: 15.0000 mL | ORAL_SOLUTION | ORAL | Status: DC | PRN

## 2013-07-14 MED ORDER — MORPHINE SULFATE 2 MG/ML IJ SOLN
2.0000 mg | INTRAMUSCULAR | Status: DC | PRN
  Administered 2013-07-14: 2 mg via INTRAVENOUS
  Filled 2013-07-14 (×2): qty 1

## 2013-07-14 MED ORDER — VANCOMYCIN HCL IN DEXTROSE 1-5 GM/200ML-% IV SOLN
1000.0000 mg | Freq: Two times a day (BID) | INTRAVENOUS | Status: AC
  Administered 2013-07-14 – 2013-07-15 (×2): 1000 mg via INTRAVENOUS
  Filled 2013-07-14 (×2): qty 200

## 2013-07-14 MED ORDER — ONDANSETRON HCL 4 MG/2ML IJ SOLN
INTRAMUSCULAR | Status: DC | PRN
  Administered 2013-07-14: 4 mg via INTRAMUSCULAR

## 2013-07-14 MED ORDER — DOPAMINE-DEXTROSE 3.2-5 MG/ML-% IV SOLN: 3.0000 ug/kg/min | INTRAVENOUS | Status: DC

## 2013-07-14 MED ORDER — LIDOCAINE-EPINEPHRINE (PF) 1 %-1:200000 IJ SOLN
INTRAMUSCULAR | Status: AC
  Filled 2013-07-14: qty 10

## 2013-07-14 MED ORDER — GLYCOPYRROLATE 0.2 MG/ML IJ SOLN
INTRAMUSCULAR | Status: DC | PRN
  Administered 2013-07-14: .8 mg via INTRAVENOUS

## 2013-07-14 MED ORDER — ACETAMINOPHEN 650 MG RE SUPP: 325.0000 mg | RECTAL | Status: DC | PRN

## 2013-07-14 MED ORDER — PROPOFOL 10 MG/ML IV BOLUS
INTRAVENOUS | Status: DC | PRN
  Administered 2013-07-14: 170 mg via INTRAVENOUS

## 2013-07-14 MED ORDER — ENOXAPARIN SODIUM 30 MG/0.3ML ~~LOC~~ SOLN
30.0000 mg | SUBCUTANEOUS | Status: DC
  Filled 2013-07-14: qty 0.3

## 2013-07-14 MED ORDER — ALUM & MAG HYDROXIDE-SIMETH 200-200-20 MG/5ML PO SUSP: 15.0000 mL | ORAL | Status: DC | PRN

## 2013-07-14 MED ORDER — TIOTROPIUM BROMIDE MONOHYDRATE 18 MCG IN CAPS
18.0000 ug | ORAL_CAPSULE | Freq: Every day | RESPIRATORY_TRACT | Status: DC
  Administered 2013-07-15: 18 ug via RESPIRATORY_TRACT
  Filled 2013-07-14: qty 5

## 2013-07-14 MED ORDER — RAMIPRIL 2.5 MG PO CAPS
2.5000 mg | ORAL_CAPSULE | Freq: Every day | ORAL | Status: DC
  Filled 2013-07-14: qty 1

## 2013-07-14 MED ORDER — METOPROLOL TARTRATE 1 MG/ML IV SOLN: 2.0000 mg | INTRAVENOUS | Status: DC | PRN

## 2013-07-14 MED ORDER — SODIUM CHLORIDE 0.9 % IV SOLN
INTRAVENOUS | Status: DC
  Administered 2013-07-14: 75 mL/h via INTRAVENOUS

## 2013-07-14 MED ORDER — LABETALOL HCL 5 MG/ML IV SOLN: 10.0000 mg | INTRAVENOUS | Status: DC | PRN

## 2013-07-14 MED ORDER — PHENOL 1.4 % MT LIQD: 1.0000 | OROMUCOSAL | Status: DC | PRN

## 2013-07-14 MED ORDER — MUPIROCIN 2 % EX OINT
TOPICAL_OINTMENT | CUTANEOUS | Status: AC
  Filled 2013-07-14: qty 22

## 2013-07-14 MED ORDER — CLOPIDOGREL BISULFATE 75 MG PO TABS
75.0000 mg | ORAL_TABLET | Freq: Every day | ORAL | Status: DC
  Administered 2013-07-15: 75 mg via ORAL
  Filled 2013-07-14: qty 1

## 2013-07-14 MED ORDER — LIDOCAINE-EPINEPHRINE (PF) 1 %-1:200000 IJ SOLN
INTRAMUSCULAR | Status: DC | PRN
  Administered 2013-07-14: 30 mL

## 2013-07-14 MED ORDER — CILOSTAZOL 100 MG PO TABS
100.0000 mg | ORAL_TABLET | Freq: Every day | ORAL | Status: DC
  Administered 2013-07-15: 100 mg via ORAL
  Filled 2013-07-14 (×2): qty 1

## 2013-07-14 MED ORDER — DEXTRAN 40 IN SALINE 10-0.9 % IV SOLN
INTRAVENOUS | Status: AC
  Filled 2013-07-14: qty 500

## 2013-07-14 MED ORDER — POTASSIUM CHLORIDE CRYS ER 20 MEQ PO TBCR: 20.0000 meq | EXTENDED_RELEASE_TABLET | Freq: Once | ORAL | Status: AC | PRN

## 2013-07-14 MED ORDER — ONDANSETRON HCL 4 MG/2ML IJ SOLN
4.0000 mg | Freq: Once | INTRAMUSCULAR | Status: AC | PRN
  Administered 2013-07-14: 4 mg via INTRAVENOUS

## 2013-07-14 MED ORDER — FENTANYL CITRATE 0.05 MG/ML IJ SOLN
INTRAMUSCULAR | Status: DC | PRN
  Administered 2013-07-14: 150 ug via INTRAVENOUS
  Administered 2013-07-14 (×3): 50 ug via INTRAVENOUS

## 2013-07-14 MED ORDER — ZOLPIDEM TARTRATE 5 MG PO TABS: 5.0000 mg | ORAL_TABLET | Freq: Every evening | ORAL | Status: DC | PRN

## 2013-07-14 MED ORDER — ONDANSETRON HCL 4 MG/2ML IJ SOLN
INTRAMUSCULAR | Status: AC
  Filled 2013-07-14: qty 2

## 2013-07-14 MED ORDER — LIDOCAINE HCL (CARDIAC) 20 MG/ML IV SOLN
INTRAVENOUS | Status: DC | PRN
  Administered 2013-07-14: 100 mg via INTRAVENOUS
  Administered 2013-07-14: 60 mg via INTRAVENOUS

## 2013-07-14 MED ORDER — ONDANSETRON HCL 4 MG/2ML IJ SOLN
4.0000 mg | Freq: Four times a day (QID) | INTRAMUSCULAR | Status: DC | PRN
  Administered 2013-07-14: 4 mg via INTRAVENOUS
  Filled 2013-07-14 (×2): qty 2

## 2013-07-14 MED ORDER — PROTAMINE SULFATE 10 MG/ML IV SOLN
INTRAVENOUS | Status: DC | PRN
  Administered 2013-07-14 (×4): 10 mg via INTRAVENOUS

## 2013-07-14 MED ORDER — HEPARIN SODIUM (PORCINE) 1000 UNIT/ML IJ SOLN
INTRAMUSCULAR | Status: DC | PRN
  Administered 2013-07-14: 8000 [IU] via INTRAVENOUS

## 2013-07-14 MED ORDER — DOXAZOSIN MESYLATE 2 MG PO TABS
2.0000 mg | ORAL_TABLET | Freq: Every day | ORAL | Status: DC
Start: 2013-07-15 — End: 2013-07-15
  Administered 2013-07-15: 2 mg via ORAL
  Filled 2013-07-14: qty 1

## 2013-07-14 MED ORDER — DORZOLAMIDE HCL-TIMOLOL MAL 2-0.5 % OP SOLN
2.0000 [drp] | Freq: Two times a day (BID) | OPHTHALMIC | Status: DC
  Administered 2013-07-14 – 2013-07-15 (×2): 2 [drp] via OPHTHALMIC

## 2013-07-14 MED ORDER — ROCURONIUM BROMIDE 100 MG/10ML IV SOLN
INTRAVENOUS | Status: DC | PRN
  Administered 2013-07-14: 50 mg via INTRAVENOUS

## 2013-07-14 MED ORDER — LACTATED RINGERS IV SOLN
INTRAVENOUS | Status: DC | PRN
  Administered 2013-07-14: 07:00:00 via INTRAVENOUS

## 2013-07-14 MED ORDER — SODIUM CHLORIDE 0.9 % IV SOLN: 500.0000 mL | Freq: Once | INTRAVENOUS | Status: AC | PRN

## 2013-07-14 MED ORDER — SODIUM CHLORIDE 0.9 % IR SOLN
Status: DC | PRN
  Administered 2013-07-14: 10:00:00

## 2013-07-14 MED ORDER — DORZOLAMIDE HCL-TIMOLOL MAL 2-0.5 % OP SOLN
2.0000 [drp] | Freq: Two times a day (BID) | OPHTHALMIC | Status: DC
  Administered 2013-07-14: 2 [drp] via OPHTHALMIC
  Filled 2013-07-14: qty 10

## 2013-07-14 MED ORDER — BRIMONIDINE TARTRATE 0.15 % OP SOLN
3.0000 [drp] | Freq: Three times a day (TID) | OPHTHALMIC | Status: DC
  Administered 2013-07-14: 3 [drp] via OPHTHALMIC
  Filled 2013-07-14: qty 5

## 2013-07-14 MED ORDER — OXYCODONE HCL 5 MG PO TABS: 5.0000 mg | ORAL_TABLET | Freq: Four times a day (QID) | ORAL | Status: DC | PRN

## 2013-07-14 MED ORDER — HYDROMORPHONE HCL PF 1 MG/ML IJ SOLN
0.2500 mg | INTRAMUSCULAR | Status: DC | PRN
  Administered 2013-07-14 (×3): 0.5 mg via INTRAVENOUS

## 2013-07-14 MED ORDER — DEXTRAN 40 IN SALINE 10-0.9 % IV SOLN
INTRAVENOUS | Status: DC | PRN
  Administered 2013-07-14: 500 mL

## 2013-07-14 MED ORDER — 0.9 % SODIUM CHLORIDE (POUR BTL) OPTIME
TOPICAL | Status: DC | PRN
  Administered 2013-07-14: 2000 mL

## 2013-07-14 MED ORDER — VARENICLINE TARTRATE 1 MG PO TABS
1.0000 mg | ORAL_TABLET | Freq: Two times a day (BID) | ORAL | Status: DC
  Filled 2013-07-14 (×3): qty 1

## 2013-07-14 SURGICAL SUPPLY — 56 items
ADH SKN CLS APL DERMABOND .7 (GAUZE/BANDAGES/DRESSINGS) ×2
BAG DECANTER FOR FLEXI CONT (MISCELLANEOUS) ×3 IMPLANT
CANISTER SUCTION 2500CC (MISCELLANEOUS) ×3 IMPLANT
CANNULA VESSEL 3MM 2 BLNT TIP (CANNULA) ×3 IMPLANT
CATH ROBINSON RED A/P 18FR (CATHETERS) ×3 IMPLANT
CLIP TI MEDIUM 24 (CLIP) ×3 IMPLANT
CLIP TI WIDE RED SMALL 24 (CLIP) ×3 IMPLANT
COVER SURGICAL LIGHT HANDLE (MISCELLANEOUS) ×3 IMPLANT
CRADLE DONUT ADULT HEAD (MISCELLANEOUS) ×3 IMPLANT
DERMABOND ADVANCED (GAUZE/BANDAGES/DRESSINGS) ×1
DERMABOND ADVANCED .7 DNX12 (GAUZE/BANDAGES/DRESSINGS) ×2 IMPLANT
DRAIN CHANNEL 15F RND FF W/TCR (WOUND CARE) IMPLANT
DRAPE PROXIMA HALF (DRAPES) ×1 IMPLANT
DRAPE WARM FLUID 44X44 (DRAPE) ×3 IMPLANT
ELECT REM PT RETURN 9FT ADLT (ELECTROSURGICAL) ×3
ELECTRODE REM PT RTRN 9FT ADLT (ELECTROSURGICAL) ×2 IMPLANT
EVACUATOR SILICONE 100CC (DRAIN) IMPLANT
GLOVE BIO SURGEON STRL SZ 6.5 (GLOVE) ×2 IMPLANT
GLOVE BIO SURGEON STRL SZ7.5 (GLOVE) ×3 IMPLANT
GLOVE BIOGEL PI IND STRL 6.5 (GLOVE) IMPLANT
GLOVE BIOGEL PI IND STRL 7.0 (GLOVE) IMPLANT
GLOVE BIOGEL PI IND STRL 7.5 (GLOVE) IMPLANT
GLOVE BIOGEL PI IND STRL 8 (GLOVE) ×2 IMPLANT
GLOVE BIOGEL PI INDICATOR 6.5 (GLOVE) ×1
GLOVE BIOGEL PI INDICATOR 7.0 (GLOVE) ×1
GLOVE BIOGEL PI INDICATOR 7.5 (GLOVE) ×1
GLOVE BIOGEL PI INDICATOR 8 (GLOVE) ×1
GLOVE ECLIPSE 6.0 STRL STRAW (GLOVE) ×1 IMPLANT
GLOVE SS N UNI LF 7.5 STRL (GLOVE) ×1 IMPLANT
GLOVE SURG SS PI 7.0 STRL IVOR (GLOVE) ×3 IMPLANT
GOWN PREVENTION PLUS XXLARGE (GOWN DISPOSABLE) ×1 IMPLANT
GOWN STRL NON-REIN LRG LVL3 (GOWN DISPOSABLE) ×8 IMPLANT
GOWN STRL REIN XL XLG (GOWN DISPOSABLE) ×2 IMPLANT
KIT BASIN OR (CUSTOM PROCEDURE TRAY) ×3 IMPLANT
KIT ROOM TURNOVER OR (KITS) ×3 IMPLANT
NDL HYPO 25X1 1.5 SAFETY (NEEDLE) ×2 IMPLANT
NEEDLE HYPO 25X1 1.5 SAFETY (NEEDLE) ×3 IMPLANT
NS IRRIG 1000ML POUR BTL (IV SOLUTION) ×6 IMPLANT
PACK CAROTID (CUSTOM PROCEDURE TRAY) ×3 IMPLANT
PAD ARMBOARD 7.5X6 YLW CONV (MISCELLANEOUS) ×6 IMPLANT
PATCH HEMASHIELD 8X75 (Vascular Products) ×1 IMPLANT
SHUNT CAROTID BYPASS 10 (VASCULAR PRODUCTS) ×1 IMPLANT
SHUNT CAROTID BYPASS 12FRX15.5 (VASCULAR PRODUCTS) IMPLANT
SPONGE SURGIFOAM ABS GEL 100 (HEMOSTASIS) IMPLANT
SUT PROLENE 6 0 BV (SUTURE) ×5 IMPLANT
SUT PROLENE 7 0 BV 1 (SUTURE) IMPLANT
SUT SILK 2 0 FS (SUTURE) IMPLANT
SUT SILK 3 0 TIES 17X18 (SUTURE)
SUT SILK 3-0 18XBRD TIE BLK (SUTURE) IMPLANT
SUT VIC AB 3-0 SH 27 (SUTURE) ×3
SUT VIC AB 3-0 SH 27X BRD (SUTURE) ×2 IMPLANT
SUT VICRYL 4-0 PS2 18IN ABS (SUTURE) ×3 IMPLANT
SYR CONTROL 10ML LL (SYRINGE) ×3 IMPLANT
TOWEL OR 17X24 6PK STRL BLUE (TOWEL DISPOSABLE) ×3 IMPLANT
TOWEL OR 17X26 10 PK STRL BLUE (TOWEL DISPOSABLE) ×3 IMPLANT
WATER STERILE IRR 1000ML POUR (IV SOLUTION) ×3 IMPLANT

## 2013-07-14 NOTE — Progress Notes (Signed)
Not able to void, bladder scan done shows 424 cc's urine.  I/O cath done using hospital protocol and received 450 cc's of dark amber urine.  Patient tolerated without event.  Continue to monitor.

## 2013-07-14 NOTE — Op Note (Signed)
NAME: Troy [name]    MRN: [id number] DOB: [birth year]    DATE OF OPERATION: 07/14/2013  PREOP DIAGNOSIS: symptomatic right carotid stenosis  POSTOP DIAGNOSIS: same  PROCEDURE: right carotid endarterectomy with Dacron patch angioplasty  SURGEON: Di Kindle. Edilia Bo, MD, FACS  ASSIST: Durene Cal M.D., Doreatha Massed, [place]  ANESTHESIA: Gen.   EBL: minimal  INDICATIONS: Troy [name] is a 72 y.o. male who presented with right hemispheric TIAs. He was found to have a tight right carotid stenosis and presents for elective right carotid endarterectomy  FINDINGS: This patient had a hemorrhagic plaque that was fairly focal in the proximal internal carotid artery producing a greater than 90% stenosis. This ulceration overlying the hemorrhagic plaque.   TECHNIQUE: The patient was taken to the operating room after monitoring lines were placed by anesthesia. The patient received a general anesthetic. The right neck was prepped and draped in the usual sterile fashion. An incision was made along the anterior border of the sternocleidomastoid and the dissection carried down to the common carotid artery which was dissected free and controlled with a Rummel tourniquet. The facial vein was divided between 2-0 ties.  The internal carotid artery was controlled above the plaque in the external carotid artery and superior thyroid arteries were controlled. Patient was then heparinized. Clamps were then placed on the internal then the common then the external carotid artery. A longitudinal arteriotomy was made in the common carotid artery. This was extended through the plaque into the internal carotid artery above the plaque. A 10 shunt was placed into the internal carotid artery, backbled and then placed into the common carotid artery and secured with Rummel tourniquet. Flow is reestablished the shunt. An endarterectomy plane was established proximally and the plaque was sharply divided. Eversion endarterectomy was  performed of the external carotid artery. Distally there was nice tapering the plaque and no tacking sutures were required. The artery was irrigated with cemented dextran all loose debris removed. Dacron patch was then sewn using continuous 6-0 Prolene suture prior to completing the patch closure, the shunt was removed. The artery was backbled and flushed appropriately and the anastomosis completed. Flow was reestablished to the external carotid artery first. At the completion there was an excellent Doppler signal in the internal carotid artery. It was a good pulse distal to the patch. The proper was partially reversed with protamine. The wounds closed the deep layer of 3-0 Vicryl. The platysma was closed with running 3-0 Vicryl. The skin was closed with 4-0 subcuticular stitch. Dermabond was applied. The patient tolerated the procedure well and awoke neurologically intact. All needle and sponge counts were correct. The patient was transferred to the recovery room in stable condition.  Waverly Ferrari, MD, FACS [facility]  DATE OF DICTATION:   07/14/2013

## 2013-07-14 NOTE — Progress Notes (Signed)
07/14/2013 Pharmacy may adjust abx as needed for renal fxn. 72 yo M s/p CEA.  Wt 89 kg, creat cl 65-75 ml/min.  He was given vancomycin 1500 mg preop at 10 am. He has vancomycin 1000 mg IV q12 hrs ordered for 2200 tonight and 10 am. Dose is appropriate Herby Abraham, Pharm.D. [phone] 07/14/2013 4:46 PM

## 2013-07-14 NOTE — Preoperative (Signed)
Beta Blockers   Reason not to administer Beta Blockers:Not Applicable 

## 2013-07-14 NOTE — Telephone Encounter (Addendum)
Message copied by Fredrich Birks on Tue Jul 14, 2013  4:13 PM ------      Message from: Sharee Pimple      Created: Tue Jul 14, 2013 11:59 AM      Regarding: schedule                   ----- Message -----         From: Dara Lords, PA-C         Sent: 07/14/2013  11:29 AM           To: Sharee Pimple, CMA            S/p right CEA 07/14/13.  F/u with Dr. Edilia Bo in 2 weeks.            Thanks,      Samantha ------  07/14/13: LM for patient regarding surgical f/u on 07/29/13 @ 115pm, dpm

## 2013-07-14 NOTE — H&P (View-Only) (Signed)
[facility] [place] HISTORY AND PHYSICAL   History of Present Illness:  Patient is a 71 y.o. year old male who presents for evaluation of symptomatic right internal carotid artery stenosis.  The patient had an episode of presyncope dizziness left hand ataxia and left leg weakness which occurred 3 days ago. He also had some purple patches in his right eye. This resolved over several hours. The patient denies prior history or stroke. He denies prior history of TIA or amaurosis. The patient has been followed for many years by Dr. Dickson for peripheral arterial disease and a small abdominal aortic aneurysm. He is on a statin aspirin and Plavix. He had a fairly normal stress test and Echo in March of this year. This was done by Dr. Maclean.  He does have a history of some ectopic beats. He has a remote history of atrial fibrillation. Other medical problems include hypertension hyperlipidemia glaucoma abdominal aortic aneurysm (3.4 cm in January 2014) and remote history of lung cancer all of which are currently.  He is a smoker but is currently on Chantix trying to quit.  Past Medical History  Diagnosis Date  . Hypertension   . Hyperlipidemia   . Glaucoma BOTH EYES    Dr Shapiro  . Hx of adenomatous colonic polyps 2005     X 2; 1 hyperplastic polyp; Dr Brodie  . Impaired fasting glucose 2007    108; A1c5.4%  . History of lung cancer APRIL 2012  SQUAMOUS CELL---- S/P RIGHT LOWER LOBECTOMY AT [facility] --  NO CHEMORADIATION---  NO RECURRENCE     ONCOLOGIST- DR NEIJSTROM  LOV IN EPIC 10-27-2012  . GERD (gastroesophageal reflux disease)   . BPH (benign prostatic hypertrophy)   . Frequency of urination   . Urgency of urination   . Nocturia   . Lesion of bladder   . Microhematuria   . Arthritis   . History of basal cell carcinoma excision   . Exertional dyspnea   . Peripheral vascular disease S/P ANGIOPLASTY AND STENTING    FOLLOWED  BY DR DICKSON  . PAD (peripheral artery disease)  ABI'S  JAN 2014  0.65 ON RIGHT ;  1.04 ON LEFT  . AAA (abdominal aortic aneurysm) LAST ABD. US  JAN 2014  3.4CM    MONITORED BY DR DICKSON  . Bilateral carotid artery stenosis DUPLEX 12-29-2012  BY DR MCLEAN    BILATERAL ICA STENOSIS 60-79%  . COPD (chronic obstructive pulmonary disease)     Past Surgical History  Procedure Laterality Date  . Angio plasty      X 4 in legs  . Colon polyectomy    . Trabecular surgery      OS  . Lung lobectomy  01/24/2011     RIGHT UPPER LOBE  (SQUAMOUS CELL CARCINOMA) Dr D'imico , [facility]. No chemotherapyor radiation  . Laryngoscopy  06-27-2004    BX VOCAL CORD  (LEUKOPLAKIA)  PER PT NO ISSUES SINCE  . Cataract extraction w/ intraocular lens  implant, bilateral    . Basal cell carcinoma excision      MULTIPLE TIMES--  RIGHT FOREARM, CHEEKS, AND BACK  . Femoral-popliteal bypass graft Left 1994  [facility]    AND 2001  IN [place]  . Cardiovascular stress test  12-08-2012  DR MCLEAN    NORMAL LEXISCAN WITH NO EXERCISE NUCLEAR STUDY/ EF 66%/   NO ISCHEMIA/ NO SIGNIFICANT CHANGE FROM PRIOR STUDY  . Transthoracic echocardiogram  12-29-2012  DR MCLEAN      MILD LVH/  LVSF NORMAL/ EF 55-60%/  GRADE I DIASTOLIC DYSFUNCTION  . Aortogram  07-27-2002    MILD DIFFUSE ILIAC ARTERY OCCLUSIVE DISEASE /  LEFT RENAL ARTERY 20%/ PATENT LEFT FEM-POP GRAFT/ MILD SFA AND POPLITEAL ARTERY OCCLUSIVE DISEASE W/ SEVERE KIDNEY OCCLUSIVE DISEASE  . Cystoscopy w/ retrogrades Bilateral 01/21/2013    Procedure: CYSTOSCOPY WITH RETROGRADE PYELOGRAM;  Surgeon: Daniel Young Woodruff, MD;  Location: [facility];  Service: Urology;  Laterality: Bilateral;   CYSTO, BLADDER BIOPSY, BILATERAL RETROGRADE PYELOGRAM  RAD TECH FROM RADIOLOGY PER JOY  . Cystoscopy with biopsy N/A 01/21/2013    Procedure: CYSTOSCOPY WITH BIOPSY;  Surgeon: Daniel Young Woodruff, MD;  Location: [facility];  Service: Urology;  Laterality: N/A;    Social History History  Substance Use  Topics  . Smoking status: Current Every Day Smoker -- 0.50 packs/day for 50 years    Types: Cigarettes  . Smokeless tobacco: Never Used     Comment: pt states that he is now taking chantix  . Alcohol Use: Yes     Comment:  socially, variable    Family History Family History  Problem Relation Age of Onset  . Stroke Mother     mini strokes  . Alcohol abuse Father   . Heart disease Father     MI after 55  . Stroke Father   . Hypertension Father   . Heart attack Father   . Heart disease Paternal Aunt     several  . Hypertension Paternal Aunt     several  . Stroke Paternal Aunt     several  . Stroke Paternal Uncle     several  . Heart disease Paternal Uncle     several;2 had MI pre 55    Allergies  Allergies  Allergen Reactions  . Penicillins Shortness Of Breath    Flushing  & dyspnea Because of a history of documented adverse serious drug reaction;Medi Alert bracelet  is recommended  . Sulfonamide Derivatives Shortness Of Breath    Flushing & dyspnea Because of a history of documented adverse serious drug reaction;Medi Alert bracelet  is recommended  . Niacin-Lovastatin Er Other (See Comments)    Dyspnea, flushing  . Ramipril     cough  . Atorvastatin Other (See Comments)    Myalgias & athralgias     Current Outpatient Prescriptions  Medication Sig Dispense Refill  . aspirin 81 MG chewable tablet Chew 1 tablet (81 mg total) by mouth daily.  20 tablet  0  . bimatoprost (LUMIGAN) 0.03 % ophthalmic solution Place 1 drop into both eyes at bedtime.      . brimonidine (ALPHAGAN P) 0.1 % SOLN Place 3 drops into the left eye 3 (three) times daily.       . clopidogrel (PLAVIX) 75 MG tablet Take 75 mg by mouth daily.       . dorzolamide-timolol (COSOPT) 22.3-6.8 MG/ML ophthalmic solution Place 2 drops into the right eye 2 (two) times daily.      . doxazosin (CARDURA) 4 MG tablet Take 2 mg by mouth daily.      . MELATONIN PO Take 1 tablet by mouth at bedtime as needed  (sleep).       . Multiple Vitamins-Minerals (CENTRUM SILVER PO) Take 1 tablet by mouth daily.      . naproxen sodium (ANAPROX) 220 MG tablet Take 440 mg by mouth daily as needed (pain).      . rosuvastatin (CRESTOR) 40 MG tablet   Take 20 mg by mouth every morning.      . sennosides-docusate sodium (SENOKOT-S) 8.6-50 MG tablet Take 1 tablet by mouth daily as needed for constipation.      . varenicline (CHANTIX) 1 MG tablet Take 1 mg by mouth 2 (two) times daily.      . cilostazol (PLETAL) 100 MG tablet Take 1 tablet (100 mg total) by mouth daily.  30 tablet  5  . tiotropium (SPIRIVA HANDIHALER) 18 MCG inhalation capsule Place 1 capsule (18 mcg total) into inhaler and inhale daily.  30 capsule  12   No current facility-administered medications for this visit.    ROS:   General:  No weight loss, Fever, chills  HEENT: No recent headaches, no nasal bleeding, no visual changes, no sore throat  Neurologic: No dizziness, blackouts, seizures. No recent symptoms of stroke or mini- stroke. No recent episodes of slurred speech, or temporary blindness.  Cardiac: No recent episodes of chest pain/pressure, no shortness of breath at rest.  + shortness of breath with exertion. + history of atrial fibrillation or irregular heartbeat  Vascular: No history of rest pain in feet.  No history of claudication.  No history of non-healing ulcer, No history of DVT   Pulmonary: No home oxygen, + productive cough, no hemoptysis,  No asthma or wheezing  Musculoskeletal:  [ ] Arthritis, [ ] Low back pain,  [ ] Joint pain  Hematologic:No history of hypercoagulable state.  No history of easy bleeding.  No history of anemia  Gastrointestinal: No hematochezia or melena,  No gastroesophageal reflux, no trouble swallowing  Urinary: [ ] chronic Kidney disease, [ ] on HD - [ ] MWF or [ ] TTHS, [ ] Burning with urination, [ ] Frequent urination, [ ] Difficulty urinating;   Skin: No rashes  Psychological: No history of  anxiety,  No history of depression   Physical Examination  Filed Vitals:   07/09/13 1321 07/09/13 1328  BP: 106/57 105/61  Pulse: 100   Height: 6' 1" (1.854 m)   Weight: 198 lb 3.2 oz (89.903 kg)   SpO2: 100%     Body mass index is 26.16 kg/(m^2).  General:  Alert and oriented, no acute distress HEENT: Normal Neck: No bruit or JVD Pulmonary: Clear to auscultation bilaterally Cardiac: Irregularly irregular Rate and Rhythm without murmur Abdomen: Soft, non-tender, non-distended, no mass Skin: No rash Extremity Pulses:  2+ radial, brachial, femoral pulses bilaterally Musculoskeletal: No deformity or edema  Neurologic: Upper and lower extremity motor 5/5 and symmetric  DATA:  Carotid duplex scan was performed at [facility] on October 7. This showed 60-80% left internal carotid artery stenosis greater than 80% right internal carotid artery stenosis vertebral antegrade flow. We repeated his right carotid duplex in our office today for operative planning purposes. This suggested that the distal internal carotid artery was not completely normal with abnormal flow within it.   ASSESSMENT:  Symptomatic right internal carotid artery stenosis but the distal extent of disease cannot be determined by duplex scan   PLAN:  Arch and carotid angiogram tomorrow to determine distal extent of occlusive disease right neck. Operative planning will be determined based on the arteriographic findings. Risks benefits possible complications and procedure details were explained to the patient and his wife today. He understands these include but are not limited to contrast reaction bleeding from the vessel possible stroke. He also has requested if possible we can test his blood to see if he responds to Plavix.   We'll also review his electrocardiogram tomorrow for his irregular heartbeat  Charles Fields, MD [facility] [place] Office: [phone] Pager: [phone]   

## 2013-07-14 NOTE — Progress Notes (Signed)
Utilization review completed.  

## 2013-07-14 NOTE — Anesthesia Preprocedure Evaluation (Signed)
Anesthesia Evaluation  Patient identified by MRN, date of birth, ID band Patient awake    Reviewed: Allergy & Precautions, H&P , NPO status , Patient's Chart, lab work & pertinent test results  Airway       Dental   Pulmonary COPD         Cardiovascular hypertension, + Peripheral Vascular Disease     Neuro/Psych    GI/Hepatic   Endo/Other    Renal/GU CRFRenal disease     Musculoskeletal   Abdominal   Peds  Hematology   Anesthesia Other Findings   Reproductive/Obstetrics                           Anesthesia Physical Anesthesia Plan  ASA: III  Anesthesia Plan: General   Post-op Pain Management:    Induction: Intravenous  Airway Management Planned: Oral ETT  Additional Equipment: Arterial line  Intra-op Plan:   Post-operative Plan: Possible Post-op intubation/ventilation  Informed Consent: I have reviewed the patients History and Physical, chart, labs and discussed the procedure including the risks, benefits and alternatives for the proposed anesthesia with the patient or authorized representative who has indicated his/her understanding and acceptance.     Plan Discussed with: CRNA, Anesthesiologist and Surgeon  Anesthesia Plan Comments:         Anesthesia Quick Evaluation

## 2013-07-14 NOTE — Transfer of Care (Signed)
Immediate Anesthesia Transfer of Care Note  Patient: Troy [name]  Procedure(s) Performed: Procedure(s): ENDARTERECTOMY CAROTID (Right) PATCH ANGIOPLASTY (Right)  Patient Location: PACU  Anesthesia Type:General  Level of Consciousness: awake, alert , oriented and patient cooperative  Airway & Oxygen Therapy: Patient Spontanous Breathing  Post-op Assessment: Report given to PACU RN, Post -op Vital signs reviewed and stable, Patient moving all extremities X 4 and Patient able to stick tongue midline  Post vital signs: Reviewed and stable  Complications: No apparent anesthesia complications

## 2013-07-14 NOTE — Interval H&P Note (Signed)
History and Physical Interval Note:  07/14/2013 9:42 AM  Troy [name]  has presented today for surgery, with the diagnosis of RIGHT ICA STENOSIS  The various methods of treatment have been discussed with the patient and family. After consideration of risks, benefits and other options for treatment, the patient has consented to  Procedure(s): ENDARTERECTOMY CAROTID (Right) as a surgical intervention .  The patient's history has been reviewed, patient examined, no change in status, stable for surgery.  I have reviewed the patient's chart and labs.  Questions were answered to the patient's satisfaction.     DICKSON,CHRISTOPHER S

## 2013-07-14 NOTE — Progress Notes (Signed)
VASCULAR PROGRESS NOTE  SUBJECTIVE: No complaints  PHYSICAL EXAM: Filed Vitals:   07/14/13 1315 07/14/13 1330 07/14/13 1345 07/14/13 1400  BP: 100/55 96/56 103/58 95/58  Pulse: 62 62 64 60  Temp:      TempSrc:      Resp: 13 11 8 11   SpO2: 97% 97% 97% 96%   Neuro intact Incision looks fine  ASSESSMENT AND PLAN: 1. Stable postop 2. Anticipate D/C in AM  Cari Caraway Beeper: [phone] 07/14/2013

## 2013-07-15 ENCOUNTER — Encounter (HOSPITAL_COMMUNITY): Payer: Self-pay | Admitting: Vascular Surgery

## 2013-07-15 LAB — BASIC METABOLIC PANEL
CO2: 22 mEq/L (ref 19–32)
Chloride: 104 mEq/L (ref 96–112)
Creatinine, Ser: 0.98 mg/dL (ref 0.50–1.35)
GFR calc Af Amer: >90 mL/min (ref >90)
GFR calc non Af Amer: 80 mL/min — ABNORMAL LOW (ref >90)
Potassium: 4.3 mEq/L (ref 3.5–5.1)

## 2013-07-15 LAB — CBC
MCHC: 34.1 g/dL (ref 30.0–36.0)
MCV: 86 fL (ref 78.0–100.0)
Platelets: 245 10*3/uL (ref 150–400)
RDW: 13.7 % (ref 11.5–15.5)
WBC: 9.8 10*3/uL (ref 4.0–10.5)

## 2013-07-15 MED ORDER — PROMETHAZINE HCL 12.5 MG PO TABS: 12.5000 mg | ORAL_TABLET | Freq: Four times a day (QID) | ORAL | Status: DC | PRN

## 2013-07-15 NOTE — Anesthesia Postprocedure Evaluation (Signed)
  Anesthesia Post-op Note  Patient: Troy [name]  Procedure(s) Performed: Procedure(s): ENDARTERECTOMY CAROTID (Right) PATCH ANGIOPLASTY (Right)  Patient Location: PACU  Anesthesia Type:General  Level of Consciousness: awake and alert   Airway and Oxygen Therapy: Patient Spontanous Breathing  Post-op Pain: none  Post-op Assessment: Post-op Vital signs reviewed, Patient's Cardiovascular Status Stable and Respiratory Function Stable  Post-op Vital Signs: Reviewed  Filed Vitals:   07/15/13 0725  BP: 112/55  Pulse: 72  Temp:   Resp: 16    Complications: No apparent anesthesia complications

## 2013-07-15 NOTE — Progress Notes (Signed)
[facility] Progress Note  07/15/2013 4:37 PM LATE ENTRY 1 Day Post-Op  Subjective:   nausea last pm, otherwise, no complaints   Filed Vitals:   07/15/13 0725  BP: 112/55  Pulse: 72  Temp:   Resp: 16     Physical Exam: Neuro:  In tact Incision:  C/d/i  CBC    Component Value Date/Time   WBC 9.8 07/15/2013 0405   RBC 3.85* 07/15/2013 0405   HGB 11.3* 07/15/2013 0405   HCT 33.1* 07/15/2013 0405   PLT 245 07/15/2013 0405   MCV 86.0 07/15/2013 0405   MCH 29.4 07/15/2013 0405   MCHC 34.1 07/15/2013 0405   RDW 13.7 07/15/2013 0405   LYMPHSABS 1.0 07/06/2013 1539   MONOABS 0.8 07/06/2013 1539   EOSABS 0.1 07/06/2013 1539   BASOSABS 0.0 07/06/2013 1539    BMET    Component Value Date/Time   NA 139 07/15/2013 0405   K 4.3 07/15/2013 0405   CL 104 07/15/2013 0405   CO2 22 07/15/2013 0405   GLUCOSE 114* 07/15/2013 0405   BUN 19 07/15/2013 0405   CREATININE 0.98 07/15/2013 0405   CALCIUM 8.6 07/15/2013 0405   GFRNONAA 80* 07/15/2013 0405   GFRAA >90 07/15/2013 0405     Intake/Output Summary (Last 24 hours) at 07/15/13 1637 Last data filed at 07/15/13 1100  Gross per 24 hour  Intake   1490 ml  Output   1775 ml  Net   -285 ml      Assessment/Plan:  This is a 72 y.o. male who is s/p right CEA 1 Day Post-Op  -pt is doing well this am.  Did have some nausea last pm, but this has resolved this am. -pt neuro exam is in tact -pt has ambulated and voided well after I&O cath last pm -will have pt eat breakfast and if he tolerates well, will discharge home later this morning.   Doreatha Massed, PA-C [facility] [phone]

## 2013-07-15 NOTE — Discharge Summary (Signed)
[facility] Discharge Summary  Troy [name] 12-05-1940 72 y.o. male  [id number]  Admission Date: 07/14/2013  Discharge Date: 07/15/13  Physician: Chuck Hint, MD  Admission Diagnosis: RIGHT ICA STENOSIS   HPI:   This is a 72 y.o. male who presents for evaluation of symptomatic right internal carotid artery stenosis. The patient had an episode of presyncope dizziness left hand ataxia and left leg weakness which occurred 3 days ago. He also had some purple patches in his right eye. This resolved over several hours. The patient denies prior history or stroke. He denies prior history of TIA or amaurosis. The patient has been followed for many years by Dr. Edilia Bo for peripheral arterial disease and a small abdominal aortic aneurysm. He is on a statin aspirin and Plavix. He had a fairly normal stress test and Echo in March of this year. This was done by Dr. Ronelle Nigh. He does have a history of some ectopic beats. He has a remote history of atrial fibrillation. Other medical problems include hypertension hyperlipidemia glaucoma abdominal aortic aneurysm (3.4 cm in January 2014) and remote history of lung cancer all of which are currently. He is a smoker but is currently on Chantix trying to quit.   Hospital Course:  The patient was admitted to the hospital and taken to the operating room on 07/14/2013 and underwent right carotid endarterectomy.  The pt tolerated the procedure well and was transported to the PACU in good condition.   By POD 1, the pt neuro status is in tact.  He did need I&O cath overnight, but was able to void well after that.  He also had nausea overnight, which did resolve.   The remainder of the hospital course consisted of increasing mobilization and increasing intake of solids without difficulty.    Recent Labs  07/15/13 0405  NA 139  K 4.3  CL 104  CO2 22  GLUCOSE 114*  BUN 19  CALCIUM 8.6    Recent Labs  07/14/13 1740  07/15/13 0405  WBC 8.7 9.8  HGB 11.6* 11.3*  HCT 35.0* 33.1*  PLT 255 245   No results found for this basename: INR,  in the last 72 hours  Discharge Instructions:   The patient is discharged to home with extensive instructions on wound care and progressive ambulation.  They are instructed not to drive or perform any heavy lifting until returning to see the physician in his office.  Discharge Orders   Future Appointments Provider Department Dept Phone   07/20/2013 11:30 AM Laurey Morale, MD [facility] [place] Office [phone]   07/29/2013 1:15 PM Chuck Hint, MD [facility] [phone]   10/21/2013 8:30 AM [facility] [place] [facility] [address] [phone]   Eat a light meal the night before the exam but please avoid gaseous foods.   Nothing to eat or drink for at least 8 hours prior to the exam. No gum chewing or smoking the morning of the exam. Please take your morning medications with small sips of water, especially blood pressure medication. If you have several vascular lab exams and will see physician, please bring a snack with you.   10/21/2013 9:00 AM [facility] [place] [facility] [address] [phone]   10/21/2013 10:00 AM Carma Lair Nickel, NP [facility] [phone]   Future Orders Complete By Expires   Call MD for:  redness, tenderness, or signs of infection (pain, swelling, bleeding, redness,  odor or green/yellow discharge around incision site)  As directed    Call MD for:  severe or increased pain, loss or decreased feeling  in affected limb(s)  As directed    Call MD for:  temperature >100.5  As directed    CAROTID Sugery: Call MD for difficulty swallowing or speaking; weakness in arms or legs that is a new symtom; severe headache.  If you have increased swelling in the neck and/or  are having difficulty breathing, CALL [phone]  As directed    Discharge wound care:  As  directed    Comments:     Shower daily with soap and water starting 07/15/13   Driving Restrictions  As directed    Comments:     No driving for 2 weeks   Lifting restrictions  As directed    Comments:     No lifting for 2 weeks   Nursing communication  As directed    Scheduling Instructions:     Please give paper Rx to patient at discharge.  It is located on the paper chart.   Resume previous diet  As directed       Discharge Diagnosis:  RIGHT ICA STENOSIS  Secondary Diagnosis: Patient Active Problem List   Diagnosis Date Noted  . Occlusion and stenosis of carotid artery without mention of cerebral infarction 07/09/2013  . Smoker 07/09/2013  . COPD with emphysema 07/09/2013  . Chronic renal disease, stage 3, moderately decreased glomerular filtration rate between 30-59 mL/min/1.73 square meter 07/06/2013  . TIA (transient ischemic attack) 07/06/2013  . Chest pain 12/01/2012  . Fatigue 12/01/2012  . Right carotid bruit 12/01/2012  . Abdominal aneurysm without mention of rupture 10/22/2012  . Basal cell carcinoma of skin 10/10/2012  . PAD (peripheral artery disease) 12/16/2011  . At risk for coronary artery disease 12/16/2011  . AAA (abdominal aortic aneurysm) 09/27/2011  . Squamous cell lung cancer 11/08/2010  . OTHER NONSPECIFIC ABNORMAL FINDING OF LUNG FIELD 10/31/2010  . GAIT IMBALANCE 03/02/2009  . FASTING HYPERGLYCEMIA 03/02/2009  . COLONIC POLYPS, HX OF 03/02/2009  . Tobacco Use Disorder 01/15/2008  . OBSTRUCTIVE CHRONIC BRONCHITIS WITHOUT EXACERBAT 01/15/2008  . HYPERPLASIA PROSTATE UNS W/O UR OBST & OTH LUTS 01/15/2008  . HYPERLIPIDEMIA 09/29/2007  . PERIPHERAL VASCULAR DISEASE 09/29/2007  . LEUKOPLAKIA, VOCAL CORDS 09/29/2007  . FEMORAL BRUIT 09/29/2007  . GLAUCOMA NOS 10/30/2006   Past Medical History  Diagnosis Date  . Hypertension   . Hyperlipidemia   . Glaucoma BOTH EYES    Dr Nile Riggs  . Hx of adenomatous colonic polyps 2005     X 2; 1 hyperplastic  polyp; Dr Juanda Chance  . Impaired fasting glucose 2007    108; A1c5.4%  . History of lung cancer APRIL 2012  SQUAMOUS CELL---- S/P RIGHT LOWER LOBECTOMY AT [facility] --  NO CHEMORADIATION---  NO RECURRENCE     ONCOLOGIST- DR Mariel Sleet  LOV IN [facility] 10-27-2012  . GERD (gastroesophageal reflux disease)   . BPH (benign prostatic hypertrophy)   . Frequency of urination   . Urgency of urination   . Nocturia   . Lesion of bladder   . Microhematuria   . Arthritis   . History of basal cell carcinoma excision   . Exertional dyspnea   . Peripheral vascular disease S/P ANGIOPLASTY AND STENTING    FOLLOWED  BY DR Edilia Bo  . PAD (peripheral artery disease) ABI'S  JAN 2014  0.65 ON RIGHT ;  1.04 ON LEFT  . AAA (abdominal aortic  aneurysm) LAST ABD. [name]  JAN 2014  3.4CM    MONITORED BY DR Edilia Bo  . Bilateral carotid artery stenosis DUPLEX 12-29-2012  BY DR [facility]    BILATERAL ICA STENOSIS 60-79%  . COPD (chronic obstructive pulmonary disease)       Medication List         aspirin 81 MG chewable tablet  Chew 1 tablet (81 mg total) by mouth daily.     bimatoprost 0.03 % ophthalmic solution  Commonly known as:  LUMIGAN  Place 1 drop into both eyes at bedtime.     brimonidine 0.1 % Soln  Commonly known as:  ALPHAGAN P  Place 3 drops into the left eye 3 (three) times daily.     CENTRUM SILVER PO  Take 1 tablet by mouth daily.     cilostazol 100 MG tablet  Commonly known as:  PLETAL  Take 1 tablet (100 mg total) by mouth daily.     clopidogrel 75 MG tablet  Commonly known as:  PLAVIX  Take 75 mg by mouth daily.     dorzolamide-timolol 22.3-6.8 MG/ML ophthalmic solution  Commonly known as:  COSOPT  Place 2 drops into the right eye 2 (two) times daily.     doxazosin 4 MG tablet  Commonly known as:  CARDURA  Take 2 mg by mouth daily.     MELATONIN PO  Take 1 tablet by mouth at bedtime as needed (sleep).     naproxen sodium 220 MG tablet  Commonly known as:  ANAPROX  Take 440 mg by mouth  daily as needed (pain).     oxyCODONE 5 MG immediate release tablet  Commonly known as:  ROXICODONE  Take 1 tablet (5 mg total) by mouth every 6 (six) hours as needed for pain.     promethazine 12.5 MG tablet  Commonly known as:  PHENERGAN  Take 1 tablet (12.5 mg total) by mouth every 6 (six) hours as needed for nausea.     ramipril 2.5 MG capsule  Commonly known as:  ALTACE  Take 2.5 mg by mouth daily.     rosuvastatin 40 MG tablet  Commonly known as:  CRESTOR  Take 20 mg by mouth every morning.     sennosides-docusate sodium 8.6-50 MG tablet  Commonly known as:  SENOKOT-S  Take 1 tablet by mouth daily as needed for constipation.     tiotropium 18 MCG inhalation capsule  Commonly known as:  SPIRIVA HANDIHALER  Place 1 capsule (18 mcg total) into inhaler and inhale daily.     varenicline 1 MG tablet  Commonly known as:  CHANTIX  Take 1 mg by mouth 2 (two) times daily.        Roxicodone #30 No Refill  Disposition: home  Patient's condition: is Good  Follow up: 1. Dr. Edilia Bo in 2 weeks.   Doreatha Massed, PA-C [facility] [phone]  --- For [facility] use --- Instructions: Press F2 to tab through selections.  Delete question if not applicable.   Modified Rankin score at D/C (0-6): 1  IV medication needed for:  1. Hypertension: No 2. Hypotension: No  Post-op Complications: No  1. Post-op CVA or TIA: No  If yes: Event classification (right eye, left eye, right cortical, left cortical, verterobasilar, other): n/a  If yes: Timing of event (intra-op, <6 hrs post-op, >=6 hrs post-op, unknown): n/a  2. CN injury: No  If yes: CN n/a injuried   3. Myocardial infarction: No  If yes:  Dx by (EKG or clinical, Troponin): n/a  4.  CHF: No  5.  Dysrhythmia (new): No  6. Wound infection: No  7. Reperfusion symptoms: No  8. Return to OR: No  If yes: return to OR for (bleeding, neurologic, other CEA incision, other): n/a  Discharge  medications: Statin use:  Yes If No: [ ]  For Medical reasons, [ ]  Non-compliant, [ ]  Not-indicated ASA use:  Yes  If No: [ ]  For Medical reasons, [ ]  Non-compliant, [ ]  Not-indicated Beta blocker use:  No If No: [ ]  For Medical reasons, [ ]  Non-compliant, [ ]  Not-indicated ACE-Inhibitor use:  Yes If No: [ ]  For Medical reasons, [ ]  Non-compliant, [ ]  Not-indicated P2Y12 Antagonist use: yes, [x]  Plavix, [ ]  Plasugrel, [ ]  Ticlopinine, [ ]  Ticagrelor, [ ]  Other, [ ]  No for medical reason, [ ]  Non-compliant, [ ]  Not-indicated Anti-coagulant use:  no, [ ]  Warfarin, [ ]  Rivaroxaban, [ ]  Dabigatran, [ ]  Other, [ ]  No for medical reason, [ ]  Non-compliant, [ ]  Not-indicated

## 2013-07-17 ENCOUNTER — Telehealth: Payer: Self-pay | Admitting: Cardiology

## 2013-07-17 NOTE — Telephone Encounter (Signed)
New problem:  Pt states he was just released from the hospital after having carotid surgery. Pt states he doesn't feel like coming into the clinic on Monday for his appt but will if his doctor thinks he needs to.... Pt would like to be advised as to rather or not he should keep his appt. Please advise

## 2013-07-17 NOTE — Telephone Encounter (Signed)
I spoke with Troy [name] & he is doing well since discharge on 10/15. He wanted to move his appointment with Dr. Shirlee Latch or "do I really need to come anyway?"  Appointment rescheduled for 10/23  At 3:30pm Mylo Red RN

## 2013-07-19 NOTE — Progress Notes (Signed)
Nausea overnight resolved.  Voiding spontaneously.  Neurologically intact.  Incision c/d/i   Durene Cal

## 2013-07-20 ENCOUNTER — Ambulatory Visit: Payer: BC Managed Care – PPO | Admitting: Cardiology

## 2013-07-20 NOTE — Discharge Summary (Signed)
Agree with plans for D/C.  Christo Hain, MD, FACS Beeper [phone] 07/20/2013  

## 2013-07-23 ENCOUNTER — Ambulatory Visit (INDEPENDENT_AMBULATORY_CARE_PROVIDER_SITE_OTHER): Payer: BC Managed Care – PPO | Admitting: Cardiology

## 2013-07-23 ENCOUNTER — Encounter: Payer: Self-pay | Admitting: Cardiology

## 2013-07-23 VITALS — BP 146/66 | HR 70 | Ht 73.0 in | Wt 197.8 lb

## 2013-07-23 DIAGNOSIS — I714 Abdominal aortic aneurysm, without rupture, unspecified: Secondary | ICD-10-CM

## 2013-07-23 DIAGNOSIS — E782 Mixed hyperlipidemia: Secondary | ICD-10-CM

## 2013-07-23 DIAGNOSIS — Z789 Other specified health status: Secondary | ICD-10-CM

## 2013-07-23 DIAGNOSIS — R079 Chest pain, unspecified: Secondary | ICD-10-CM

## 2013-07-23 DIAGNOSIS — I739 Peripheral vascular disease, unspecified: Secondary | ICD-10-CM

## 2013-07-23 DIAGNOSIS — I6529 Occlusion and stenosis of unspecified carotid artery: Secondary | ICD-10-CM

## 2013-07-23 DIAGNOSIS — Z9189 Other specified personal risk factors, not elsewhere classified: Secondary | ICD-10-CM

## 2013-07-23 DIAGNOSIS — J449 Chronic obstructive pulmonary disease, unspecified: Secondary | ICD-10-CM

## 2013-07-23 NOTE — Patient Instructions (Addendum)
Your physician wants you to follow-up in:  6 months.  You will receive a reminder letter in the mail two months in advance. If you don't receive a letter, please call our office to schedule the follow-up appointment. We will do fasting blood work at this appt(lipid and liver profiles and BMP)

## 2013-07-24 ENCOUNTER — Ambulatory Visit: Payer: BC Managed Care – PPO | Admitting: Cardiology

## 2013-07-24 DIAGNOSIS — I6529 Occlusion and stenosis of unspecified carotid artery: Secondary | ICD-10-CM | POA: Insufficient documentation

## 2013-07-24 NOTE — Progress Notes (Signed)
Patient ID: Troy [name], male   DOB: [birth year], 72 y.o.   MRN: [id number] PCP: Dr. Alwyn Ren  72 yo with history of HTN, COPD, active smoking/COPD, carotid stenosis, and PAD presents for cardiology followup.  He does not have known CAD but is at high risk for CAD based on his comorbidities.  Lexiscan Cardiolite in 3/14 showed no ischemia or infarction and echo in 3/14 showed normal EF.  He had a left fem-pop bypass at the [facility] in the '90s.  He is followed at [facility] for PAD, last ABIs in 1/14 were normal on the left but showed moderate disease on the right (stable). He has had right lower lobectomy for lung cancer.  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    Since last appointment, he had a TIA and carotid dopplers showed > 80% RICA stenosis.  He had right CEA in 10/14.  He is recovering without any problems.  He has stopped ramipril because of cough.  He has stopped smoking and is not noticing dyspnea as much now.  Spiriva has helped as well. He is short of breath walking up a steep hill but has not problems on flat ground.  No chest pain.  He has chronic right calf claudication that is unchanged over the last few years and follows regularly at [facility].    Labs (3/13): LDL 75, HDL 35, K 4.1, creatinine 1.1 Labs (3/14): LDL 91, HDL 27 Labs (10/14): K 4.3, creatinine 0.98  PMH: 1. HTN: ACEI cough.  2. COPD: Quit smoking 2014.  3. AAA: CT 1/13 with 3.0 cm AAA.  Abdominal US (1/14) with 3.4 cm AAA.  4. Squamous cell lung cancer diagnosed 2/12.  Had right lower lobectomy in 4/12.  5. Hyperlipidemia 6. PAD: Left fem-pop bypass 1994.  ABIs (2/12) 0.62 on right, 0.95 on left. ABIs (1/14): 0.65 on right, 1.04 on left.  7. Stress myoview 2004 was normal. Lexiscan Cardiolite (3/14) with EF 66%, no ischemia or infarction.  8. Echo (3/14): technically difficult with EF 55-60%, upper normal RV size.  9. Carotid stenosis: TIA 10/14.  Carotid dopplers with > 80% RICA, 60-79% LICA.  Patient had right  CEA in 10/14.   SH: Married, lives in [place].  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  Quit smoking in 2014.   FH: No premature CAD  ROS: All systems reviewed and negative except as per HPI.   Current Outpatient Prescriptions  Medication Sig Dispense Refill  . aspirin 81 MG chewable tablet Chew 1 tablet (81 mg total) by mouth daily.  20 tablet  0  . bimatoprost (LUMIGAN) 0.03 % ophthalmic solution Place 1 drop into both eyes at bedtime.      . brimonidine (ALPHAGAN P) 0.1 % SOLN Place 3 drops into the left eye 3 (three) times daily.       . cilostazol (PLETAL) 100 MG tablet Take 1 tablet (100 mg total) by mouth daily.  30 tablet  0  . clopidogrel (PLAVIX) 75 MG tablet Take 75 mg by mouth daily.      . dorzolamide-timolol (COSOPT) 22.3-6.8 MG/ML ophthalmic solution Place 2 drops into the right eye 2 (two) times daily.      [name] doxazosin (CARDURA) 4 MG tablet Take 2 mg by mouth daily.      [name] MELATONIN PO Take 1 tablet by mouth at bedtime as needed (sleep).       . Multiple Vitamins-Minerals (CENTRUM SILVER PO) Take 1 tablet by mouth daily.      [name]  naproxen sodium (ANAPROX) 220 MG tablet Take 440 mg by mouth daily as needed (pain).      [name] oxyCODONE (ROXICODONE) 5 MG immediate release tablet Take 1 tablet (5 mg total) by mouth every 6 (six) hours as needed for pain.  30 tablet  0  . promethazine (PHENERGAN) 12.5 MG tablet Take 1 tablet (12.5 mg total) by mouth every 6 (six) hours as needed for nausea.  120 tablet  0  . ramipril (ALTACE) 2.5 MG capsule Take 2.5 mg by mouth daily.      . rosuvastatin (CRESTOR) 40 MG tablet Take 20 mg by mouth every morning.      . sennosides-docusate sodium (SENOKOT-S) 8.6-50 MG tablet Take 1 tablet by mouth daily as needed for constipation.      [name] tiotropium (SPIRIVA HANDIHALER) 18 MCG inhalation capsule Place 1 capsule (18 mcg total) into inhaler and inhale daily.  30 capsule  12  . varenicline (CHANTIX) 1 MG tablet Take 1 mg by mouth 2 (two) times daily.       No  current facility-administered medications for this visit.    BP 146/66  Pulse 70  Ht 6\' 1"  (1.854 m)  Wt 89.721 kg (197 lb 12.8 oz)  BMI 26.1 kg/m2 General: NAD Neck: No JVD, no thyromegaly or thyroid nodule.  Lungs: Mildly decreased breath sounds bilaterally.  CV: Nondisplaced PMI.  Heart regular S1/S2, no S3/S4, 1/6 SEM RUSB.  No peripheral edema.  No carotid bruit. Right foot is warm but unable to palpate PT pulse.   Abdomen: Soft, nontender, no hepatosplenomegaly, no distention.   Neurologic: Alert and oriented x 3.  Psych: Normal affect. Extremities: No clubbing or cyanosis.   Assessment/Plan: 1. Chest pain: He has had no further chest pain.  Lexiscan Cardiolite was negative in 3/14 and EF was preserved on echo.  He is on ASA 81 daily, Plavix, and Crestor (for carotid stenosis/PAD).  2. Hyperlipidemia: Check lipids at followup in 6 months.  3. Carotid stenosis: s/p R CEA.  Will follow with [facility] for residual LICA stenosis.  4. PAD: Stable ABIs and stable claudication.  No foot ulcerations or rest pain.  Followed at [facility]. He is on cilostazol.  5. AAA: Had recent ultrasound with 3.4 cm AAA.  Followed at [facility].   6. COPD: Dyspnea improved with Spiriva and will stopping smoking.  I strongly encouraged him to stay off the cigarettes.    Marca Ancona 07/24/2013 10:23 AM

## 2013-07-28 ENCOUNTER — Encounter: Payer: Self-pay | Admitting: Vascular Surgery

## 2013-07-29 ENCOUNTER — Encounter: Payer: Self-pay | Admitting: Vascular Surgery

## 2013-07-29 ENCOUNTER — Ambulatory Visit (INDEPENDENT_AMBULATORY_CARE_PROVIDER_SITE_OTHER): Payer: Self-pay | Admitting: Vascular Surgery

## 2013-07-29 DIAGNOSIS — I6529 Occlusion and stenosis of unspecified carotid artery: Secondary | ICD-10-CM

## 2013-07-29 NOTE — Progress Notes (Signed)
[facility] POST OPERATIVE OFFICE NOTE  CC:  F/u for surgery  HPI:  This is a 72 y.o. male who is s/p left CEA on 07/14/13 by Dr. Edilia Bo.  The pt states he has done well since surgery.  He has not had any symptoms of stroke.  He does have post operative fatigue.  Allergies  Allergen Reactions  . Penicillins Shortness Of Breath    Flushing  & dyspnea Because of a history of documented adverse serious drug reaction;Medi Alert bracelet  is recommended  . Sulfonamide Derivatives Shortness Of Breath    Flushing & dyspnea Because of a history of documented adverse serious drug reaction;Medi Alert bracelet  is recommended  . Niacin-Lovastatin Er Other (See Comments)    Dyspnea, flushing  . Ramipril     cough  . Atorvastatin Other (See Comments)    Myalgias & athralgias    Current Outpatient Prescriptions  Medication Sig Dispense Refill  . aspirin 81 MG chewable tablet Chew 1 tablet (81 mg total) by mouth daily.  20 tablet  0  . bimatoprost (LUMIGAN) 0.03 % ophthalmic solution Place 1 drop into both eyes at bedtime.      . brimonidine (ALPHAGAN P) 0.1 % SOLN Place 3 drops into the left eye 3 (three) times daily.       . cilostazol (PLETAL) 100 MG tablet Take 1 tablet (100 mg total) by mouth daily.  30 tablet  0  . clopidogrel (PLAVIX) 75 MG tablet Take 75 mg by mouth daily.      . dorzolamide-timolol (COSOPT) 22.3-6.8 MG/ML ophthalmic solution Place 2 drops into the right eye 2 (two) times daily.      [name] doxazosin (CARDURA) 4 MG tablet Take 2 mg by mouth daily.      [name] MELATONIN PO Take 1 tablet by mouth at bedtime as needed (sleep).       . Multiple Vitamins-Minerals (CENTRUM SILVER PO) Take 1 tablet by mouth daily.      . naproxen sodium (ANAPROX) 220 MG tablet Take 440 mg by mouth daily as needed (pain).      [name] oxyCODONE (ROXICODONE) 5 MG immediate release tablet Take 1 tablet (5 mg total) by mouth every 6 (six) hours as needed for pain.  30 tablet  0  . promethazine  (PHENERGAN) 12.5 MG tablet Take 1 tablet (12.5 mg total) by mouth every 6 (six) hours as needed for nausea.  120 tablet  0  . ramipril (ALTACE) 2.5 MG capsule Take 2.5 mg by mouth daily.      . rosuvastatin (CRESTOR) 40 MG tablet Take 20 mg by mouth every morning.      . sennosides-docusate sodium (SENOKOT-S) 8.6-50 MG tablet Take 1 tablet by mouth daily as needed for constipation.      [name] tiotropium (SPIRIVA HANDIHALER) 18 MCG inhalation capsule Place 1 capsule (18 mcg total) into inhaler and inhale daily.  30 capsule  12  . varenicline (CHANTIX) 1 MG tablet Take 1 mg by mouth 2 (two) times daily.       No current facility-administered medications for this visit.     ROS:  See HPI  Physical Exam:  Filed Vitals:   07/29/13 1342  BP: 119/58  Pulse:     Incision:  Healing nicely Neuro: exam is in tact without deficit  A/P:  This is a 72 y.o. male here for f/u for left CEA  -pt is doing quite well after surgery.  His post operative nausea and  vomiting has resolved.  Fatigue should improve with time. -pt states he has an appt in January 2015 for f/u carotid duplex.  He is to keep this given he has a 60-79% stenosis of the left ICA. -pt is to continue his aspirin and crestor. -The pt does inquire about his pheasant hunting trip coming up.  Given he is 2 weeks out from surgery and doing well, Dr. Edilia Bo has told the pt it is okay to go given the pt has expressed that this is non-exertional and he does have a gator to ride in for the hunt.    Doreatha Massed, PA-C [facility] [phone]  Clinic MD:  Pt seen and examined with Dr. Edilia Bo  Agree with above. Will see him back for his F/U visit in January. He knows to call sooner if he has problems.  Waverly Ferrari, MD, FACS Beeper [phone] 07/29/2013

## 2013-08-01 NOTE — Consult Note (Signed)
NAME:  Troy [name], Troy [name] NO.:  [id number]  MEDICAL RECORD NO.:  [id number]  LOCATION:  [facility]                         FACILITY:  [facility]  PHYSICIAN:  Marylou Wages K. Johnross Nabozny, M.D.DATE OF BIRTH:  [birth year]  DATE OF CONSULTATION: DATE OF DISCHARGE:  07/10/2013                                CONSULTATION   CLINICAL HISTORY:  Patient with progressive stenosis of the right internal carotid artery proximally.  EXAMINATION:  Intracranial interpretation of bilateral common carotid arteriograms.  The right common carotid arteriogram demonstrates the right internal carotid artery in the cervical petrous junction to be normal.  The petrous, cavernous, and supraclinoid segments are widely patent.  The right middle and right anterior cerebral arteries opacify normally into the capillary and venous phases.  The left common carotid arteriogram demonstrates the left internal carotid artery  in the  cervical petrous junction to be normal.  The petrous, cavernous, and supraclinoid segments are widely patent. The left middle cerebral artery proximally demonstrates approximately 30% narrowing in its M1 segment.  The trifurcation branches are widely patent.  The left anterior cerebral artery  opacifies  normally into the capillary and venous phases.  Cross opacification via the anterior communicating artery of the right anterior cerebral artery, A2 segment is seen.  IMPRESSION:  Approximately 30% stenosis of the left middle cerebral artery M1 segment, probably related to intracranial arteriosclerosis.          ______________________________ Grandville Silos Corliss Skains, M.D.     SKD/MEDQ  D:  07/31/2013  T:  08/01/2013  Job:  [id number]

## 2013-08-12 ENCOUNTER — Ambulatory Visit (INDEPENDENT_AMBULATORY_CARE_PROVIDER_SITE_OTHER): Payer: BC Managed Care – PPO | Admitting: Nurse Practitioner

## 2013-08-12 ENCOUNTER — Encounter: Payer: Self-pay | Admitting: Nurse Practitioner

## 2013-08-12 VITALS — BP 110/60 | HR 84 | Temp 97.0°F | Ht 73.5 in | Wt 199.0 lb

## 2013-08-12 DIAGNOSIS — H6593 Unspecified nonsuppurative otitis media, bilateral: Secondary | ICD-10-CM

## 2013-08-12 DIAGNOSIS — H6590 Unspecified nonsuppurative otitis media, unspecified ear: Secondary | ICD-10-CM

## 2013-08-12 DIAGNOSIS — J329 Chronic sinusitis, unspecified: Secondary | ICD-10-CM

## 2013-08-12 MED ORDER — PREDNISONE 10 MG PO TABS: 10.0000 mg | ORAL_TABLET | Freq: Two times a day (BID) | ORAL | Status: DC

## 2013-08-12 MED ORDER — DOXYCYCLINE HYCLATE 100 MG PO TABS: 100.0000 mg | ORAL_TABLET | Freq: Two times a day (BID) | ORAL | Status: DC

## 2013-08-12 NOTE — Patient Instructions (Addendum)
I am treating you with an antibiotic and prednisone for a few days. Daily Sinus Rinses will help clear sinus passages (Neilmed Sinus Rinse). Please let us know if you develop fever or chest pain with inspiration. Feel better.

## 2013-08-12 NOTE — Progress Notes (Signed)
Pre-visit discussion using our clinic review tool. No additional management support is needed unless otherwise documented below in the visit note.  

## 2013-08-12 NOTE — Progress Notes (Signed)
  Subjective:    Patient ID: Troy [name], male    DOB: 09-26-1941, 72 y.o.   MRN: [id number]  Sinusitis This is a new problem. The current episode started in the past 7 days. The problem has been waxing and waning since onset. There has been no fever. He is experiencing no pain. Associated symptoms include congestion, coughing (productive), ear pain and a sore throat. Pertinent negatives include no chills, headaches, hoarse voice, shortness of breath, sinus pressure or swollen glands. Past treatments include oral decongestants. The treatment provided mild relief.      Review of Systems  Constitutional: Positive for fatigue. Negative for fever, chills, activity change and appetite change.  HENT: Positive for congestion, ear pain, postnasal drip and sore throat. Negative for hoarse voice, sinus pressure and voice change.   Eyes: Negative for redness.  Respiratory: Positive for cough (productive). Negative for chest tightness, shortness of breath and wheezing.   Cardiovascular: Negative for chest pain.  Gastrointestinal: Negative for nausea, abdominal pain and diarrhea.  Musculoskeletal: Negative for back pain.  Allergic/Immunologic: Negative for environmental allergies.  Neurological: Negative for headaches.  Hematological: Negative for adenopathy.       Objective:   Physical Exam  Vitals reviewed. Constitutional: He is oriented to person, place, and time. He appears well-developed and well-nourished. No distress.  HENT:  Head: Normocephalic and atraumatic.  Right Ear: Tympanic membrane is injected and retracted. Tympanic membrane is not bulging. A middle ear effusion is present.  Left Ear: Tympanic membrane is injected and retracted. Tympanic membrane is not bulging. A middle ear effusion is present.  Mouth/Throat: No oropharyngeal exudate.  Eyes: Conjunctivae are normal. Right eye exhibits no discharge. Left eye exhibits no discharge.  Neck: Neck supple. No thyromegaly present.   R-sided neck swelling post carotic endarterectomy. Scar well approximated, slightly tender  Cardiovascular: Normal rate, regular rhythm and normal heart sounds.   No murmur heard. Pulmonary/Chest: Effort normal and breath sounds normal. No respiratory distress. He has no wheezes.  Lymphadenopathy:    He has no cervical adenopathy.  Neurological: He is alert and oriented to person, place, and time.  Skin: Skin is warm and dry.  Psychiatric: He has a normal mood and affect. His behavior is normal. Thought content normal.          Assessment & Plan:  1. Unspecified sinusitis (chronic) Fatigue, cough X 1 wk. Hx squamous cell lung ca, recent endarterectomy - doxycycline (VIBRA-TABS) 100 MG tablet; Take 1 tablet (100 mg total) by mouth 2 (two) times daily.  Dispense: 14 tablet; Refill: 0 - predniSONE (DELTASONE) 10 MG tablet; Take 1 tablet (10 mg total) by mouth 2 (two) times daily.  Dispense: 8 tablet; Refill: 0  2. Otitis media with effusion, bilateral See pt instructions.

## 2013-08-18 ENCOUNTER — Encounter: Payer: BC Managed Care – PPO | Admitting: Internal Medicine

## 2013-10-06 ENCOUNTER — Other Ambulatory Visit: Payer: Self-pay | Admitting: Cardiology

## 2013-10-07 ENCOUNTER — Other Ambulatory Visit: Payer: Self-pay | Admitting: *Deleted

## 2013-10-07 ENCOUNTER — Other Ambulatory Visit: Payer: Self-pay

## 2013-10-07 DIAGNOSIS — Z48812 Encounter for surgical aftercare following surgery on the circulatory system: Secondary | ICD-10-CM

## 2013-10-07 MED ORDER — ROSUVASTATIN CALCIUM 40 MG PO TABS: 40.0000 mg | ORAL_TABLET | Freq: Every day | ORAL | Status: DC

## 2013-10-21 ENCOUNTER — Ambulatory Visit: Payer: BC Managed Care – PPO | Admitting: Family

## 2013-10-21 ENCOUNTER — Other Ambulatory Visit: Payer: BC Managed Care – PPO

## 2013-10-21 ENCOUNTER — Encounter (HOSPITAL_COMMUNITY): Payer: BC Managed Care – PPO

## 2013-10-21 ENCOUNTER — Ambulatory Visit: Payer: BC Managed Care – PPO | Admitting: Neurosurgery

## 2013-10-21 ENCOUNTER — Other Ambulatory Visit (HOSPITAL_COMMUNITY): Payer: BC Managed Care – PPO

## 2013-11-02 ENCOUNTER — Other Ambulatory Visit: Payer: Self-pay | Admitting: Internal Medicine

## 2013-11-02 NOTE — Telephone Encounter (Signed)
Plavix and Cardura refilled per protocol. JG//CMA

## 2013-11-24 ENCOUNTER — Other Ambulatory Visit: Payer: Self-pay | Admitting: Dermatology

## 2013-11-30 ENCOUNTER — Encounter: Payer: Self-pay | Admitting: Internal Medicine

## 2013-12-28 ENCOUNTER — Other Ambulatory Visit (INDEPENDENT_AMBULATORY_CARE_PROVIDER_SITE_OTHER): Payer: BC Managed Care – PPO

## 2013-12-28 ENCOUNTER — Ambulatory Visit (INDEPENDENT_AMBULATORY_CARE_PROVIDER_SITE_OTHER): Payer: BC Managed Care – PPO | Admitting: Internal Medicine

## 2013-12-28 ENCOUNTER — Encounter: Payer: Self-pay | Admitting: Internal Medicine

## 2013-12-28 VITALS — BP 140/58 | HR 83 | Temp 97.9°F | Wt 205.0 lb

## 2013-12-28 DIAGNOSIS — D649 Anemia, unspecified: Secondary | ICD-10-CM

## 2013-12-28 DIAGNOSIS — H409 Unspecified glaucoma: Secondary | ICD-10-CM

## 2013-12-28 DIAGNOSIS — R413 Other amnesia: Secondary | ICD-10-CM

## 2013-12-28 DIAGNOSIS — R4189 Other symptoms and signs involving cognitive functions and awareness: Secondary | ICD-10-CM

## 2013-12-28 DIAGNOSIS — F329 Major depressive disorder, single episode, unspecified: Secondary | ICD-10-CM

## 2013-12-28 DIAGNOSIS — F32A Depression, unspecified: Secondary | ICD-10-CM

## 2013-12-28 DIAGNOSIS — I6529 Occlusion and stenosis of unspecified carotid artery: Secondary | ICD-10-CM

## 2013-12-28 DIAGNOSIS — F3289 Other specified depressive episodes: Secondary | ICD-10-CM

## 2013-12-28 LAB — TSH: TSH: 1.53 u[IU]/mL (ref 0.35–5.50)

## 2013-12-28 LAB — VITAMIN B12: Vitamin B-12: 374 pg/mL (ref 211–911)

## 2013-12-28 MED ORDER — FLUOXETINE HCL 10 MG PO CAPS: 10.0000 mg | ORAL_CAPSULE | Freq: Every day | ORAL | Status: DC

## 2013-12-28 NOTE — Progress Notes (Signed)
   Subjective:    Patient ID: Troy [name], male    DOB: Feb 11, 1941, 73 y.o.   MRN: [id number]  HPI He describes being "forgetful " of conversations ,progressive over two years. He & his wife feels he may be depressed. No FH depression. Last TSH  1.14 on 11/30/11 Labs reveal mild anemia       Review of Systems  Constitutional:  significant change in weight of 11# gain off cigarettes; some afternoon significant fatigue;  no change in appetite.Early awakening, using Melatonin Eye: no blurred, double vision. Peripheral loss of vision with Glaucoma Cardiovascular: no palpitations; racing; irregularity ENT/GI: no constipation; diarrhea;hoarseness;dysphagia Derm: no change in nails,hair,skin Neuro: no numbness or tingling;slight LUE  Tremor in am Psych:no anxiety panic attacks Endo: temperature intolerance to cold     Objective:   Physical Exam Gen.:  well-nourished; in no acute distress Eyes: Extraocular motion intact; no lid lag or proptosis ,no nystagmus Neck: full ROM; no masses ; thyroid normal  Heart: Normal rhythm and rate without significant murmur, gallop, or extra heart sounds. Faint carotid bruits Lungs: Chest clear to auscultation without rales,rales, wheezes Neuro:Deep tendon reflexes are equal and within normal limits; no tremor  Skin: Warm and dry without significant lesions or rashes; no onycholysis Lymphatic: no cervical or axillary LA Psych: Normally communicative and interactive; no abnormal mood or affect clinically.   Mini-Mental Status exam was completed to evaluate the patient's concerns in reference to memory deficit. Results revealed:   30 out 30 Clock drawing test:good Animal naming:10 in 30 sec          Assessment & Plan:  #1 memory loss , not documented #2 depression See orders

## 2013-12-28 NOTE — Patient Instructions (Signed)
Your next office appointment will be determined based upon review of your pending labs . Those instructions will be transmitted to you through My Chart  . Please reconsider taking the agent to raise the neurotransmitters which are essential for good brain function, both intellectual & emotional health. These agents are not addictive and simply keep this essential neurotransmitter at therapeutic levels. If these levels become severely depleted; depression or panic attacks can occur.

## 2013-12-28 NOTE — Progress Notes (Signed)
Pre visit review using our clinic review tool, if applicable. No additional management support is needed unless otherwise documented below in the visit note. 

## 2014-01-07 ENCOUNTER — Other Ambulatory Visit: Payer: Self-pay | Admitting: Cardiology

## 2014-01-13 ENCOUNTER — Other Ambulatory Visit (HOSPITAL_COMMUNITY): Payer: BC Managed Care – PPO

## 2014-01-13 ENCOUNTER — Ambulatory Visit: Payer: BC Managed Care – PPO | Admitting: Vascular Surgery

## 2014-01-13 ENCOUNTER — Encounter (HOSPITAL_COMMUNITY): Payer: BC Managed Care – PPO

## 2014-01-19 ENCOUNTER — Encounter: Payer: Self-pay | Admitting: Family

## 2014-01-20 ENCOUNTER — Ambulatory Visit (HOSPITAL_COMMUNITY)
Admission: RE | Admit: 2014-01-20 | Discharge: 2014-01-20 | Disposition: A | Discharge status: Home / Self Care | Payer: BC Managed Care – PPO | Source: Ambulatory Visit | Attending: Family | Admitting: Family

## 2014-01-20 ENCOUNTER — Ambulatory Visit (INDEPENDENT_AMBULATORY_CARE_PROVIDER_SITE_OTHER)
Admission: RE | Admit: 2014-01-20 | Discharge: 2014-01-20 | Disposition: A | Discharge status: Home / Self Care | Payer: BC Managed Care – PPO | Source: Ambulatory Visit | Attending: Vascular Surgery | Admitting: Vascular Surgery

## 2014-01-20 ENCOUNTER — Ambulatory Visit (INDEPENDENT_AMBULATORY_CARE_PROVIDER_SITE_OTHER): Payer: BC Managed Care – PPO | Admitting: Family

## 2014-01-20 ENCOUNTER — Ambulatory Visit (INDEPENDENT_AMBULATORY_CARE_PROVIDER_SITE_OTHER)
Admission: RE | Admit: 2014-01-20 | Discharge: 2014-01-20 | Disposition: A | Discharge status: Home / Self Care | Payer: BC Managed Care – PPO | Source: Ambulatory Visit | Attending: Neurosurgery | Admitting: Neurosurgery

## 2014-01-20 ENCOUNTER — Encounter: Payer: Self-pay | Admitting: Family

## 2014-01-20 VITALS — BP 118/72 | HR 63 | Resp 16 | Ht 73.0 in | Wt 204.0 lb

## 2014-01-20 DIAGNOSIS — Z48812 Encounter for surgical aftercare following surgery on the circulatory system: Secondary | ICD-10-CM

## 2014-01-20 DIAGNOSIS — I739 Peripheral vascular disease, unspecified: Secondary | ICD-10-CM

## 2014-01-20 DIAGNOSIS — I714 Abdominal aortic aneurysm, without rupture, unspecified: Secondary | ICD-10-CM

## 2014-01-20 DIAGNOSIS — I6529 Occlusion and stenosis of unspecified carotid artery: Secondary | ICD-10-CM

## 2014-01-20 NOTE — Addendum Note (Signed)
Addended by: Dorthula Rue L on: 01/20/2014 12:22 PM   Modules accepted: Orders

## 2014-01-20 NOTE — Patient Instructions (Signed)
Stroke Prevention Some medical conditions and behaviors are associated with an increased chance of having a stroke. You may prevent a stroke by making healthy choices and managing medical conditions. HOW CAN I REDUCE MY RISK OF HAVING A STROKE?   Stay physically active. Get at least 30 minutes of activity on most or all days.  Do not smoke. It may also be helpful to avoid exposure to secondhand smoke.  Limit alcohol use. Moderate alcohol use is considered to be:  No more than 2 drinks per day for men.  No more than 1 drink per day for nonpregnant women.  Eat healthy foods. This involves  Eating 5 or more servings of fruits and vegetables a day.  Following a diet that addresses high blood pressure (hypertension), high cholesterol, diabetes, or obesity.  Manage your cholesterol levels.  A diet low in saturated fat, trans fat, and cholesterol and high in fiber may control cholesterol levels.  Take any prescribed medicines to control cholesterol as directed by your health care provider.  Manage your diabetes.  A controlled-carbohydrate, controlled-sugar diet is recommended to manage diabetes.  Take any prescribed medicines to control diabetes as directed by your health care provider.  Control your hypertension.  A low-salt (sodium), low-saturated fat, low-trans fat, and low-cholesterol diet is recommended to manage hypertension.  Take any prescribed medicines to control hypertension as directed by your health care provider.  Maintain a healthy weight.  A reduced-calorie, low-sodium, low-saturated fat, low-trans fat, low-cholesterol diet is recommended to manage weight.  Stop drug abuse.  Avoid taking birth control pills.  Talk to your health care provider about the risks of taking birth control pills if you are over 35 years old, smoke, get migraines, or have ever had a blood clot.  Get evaluated for sleep disorders (sleep apnea).  Talk to your health care provider about  getting a sleep evaluation if you snore a lot or have excessive sleepiness.  Take medicines as directed by your health care provider.  For some people, aspirin or blood thinners (anticoagulants) are helpful in reducing the risk of forming abnormal blood clots that can lead to stroke. If you have the irregular heart rhythm of atrial fibrillation, you should be on a blood thinner unless there is a good reason you cannot take them.  Understand all your medicine instructions.  Make sure that other other conditions (such as anemia or atherosclerosis) are addressed. SEEK IMMEDIATE MEDICAL CARE IF:   You have sudden weakness or numbness of the face, arm, or leg, especially on one side of the body.  Your face or eyelid droops to one side.  You have sudden confusion.  You have trouble speaking (aphasia) or understanding.  You have sudden trouble seeing in one or both eyes.  You have sudden trouble walking.  You have dizziness.  You have a loss of balance or coordination.  You have a sudden, severe headache with no known cause.  You have new chest pain or an irregular heartbeat. Any of these symptoms may represent a serious problem that is an emergency. Do not wait to see if the symptoms will go away. Get medical help at once. Call your local emergency services  (911 in [place] Do not drive yourself to the hospital. Document Released: 10/25/2004 Document Revised: 07/08/2013 Document Reviewed: 03/20/2013 [employer] Patient Information 2014 [employer]   Peripheral Vascular Disease Peripheral Vascular Disease (PVD), also called Peripheral Arterial Disease (PAD), is a circulation problem caused by cholesterol (atherosclerotic plaque) deposits in the   arteries. PVD commonly occurs in the lower extremities (legs) but it can occur in other areas of the body, such as your arms. The cholesterol buildup in the arteries reduces blood flow which can cause pain and other serious problems. The presence  of PVD can place a person at risk for Coronary Artery Disease (CAD).  CAUSES  Causes of PVD can be many. It is usually associated with more than one risk factor such as:   High Cholesterol.  Smoking.  Diabetes.  Lack of exercise or inactivity.  High blood pressure (hypertension).  Obesity.  Family history. SYMPTOMS   When the lower extremities are affected, patients with PVD may experience:  Leg pain with exertion or physical activity. This is called INTERMITTENT CLAUDICATION. This may present as cramping or numbness with physical activity. The location of the pain is associated with the level of blockage. For example, blockage at the abdominal level (distal abdominal aorta) may result in buttock or hip pain. Lower leg arterial blockage may result in calf pain.  As PVD becomes more severe, pain can develop with less physical activity.  In people with severe PVD, leg pain may occur at rest.  Other PVD signs and symptoms:  Leg numbness or weakness.  Coldness in the affected leg or foot, especially when compared to the other leg.  A change in leg color.  Patients with significant PVD are more prone to ulcers or sores on toes, feet or legs. These may take longer to heal or may reoccur. The ulcers or sores can become infected.  If signs and symptoms of PVD are ignored, gangrene may occur. This can result in the loss of toes or loss of an entire limb.  Not all leg pain is related to PVD. Other medical conditions can cause leg pain such as:  Blood clots (embolism) or Deep Vein Thrombosis.  Inflammation of the blood vessels (vasculitis).  Spinal stenosis. DIAGNOSIS  Diagnosis of PVD can involve several different types of tests. These can include:  Pulse Volume Recording Method (PVR). This test is simple, painless and does not involve the use of X-rays. PVR involves measuring and comparing the blood pressure in the arms and legs. An ABI (Ankle-Brachial Index) is calculated.  The normal ratio of blood pressures is 1. As this number becomes smaller, it indicates more severe disease.  < 0.95  indicates significant narrowing in one or more leg vessels.  <0.8 there will usually be pain in the foot, leg or buttock with exercise.  <0.4 will usually have pain in the legs at rest.  <0.25  usually indicates limb threatening PVD.  Doppler detection of pulses in the legs. This test is painless and checks to see if you have a pulses in your legs/feet.  A dye or contrast material (a substance that highlights the blood vessels so they show up on x-ray) may be given to help your caregiver better see the arteries for the following tests. The dye is eliminated from your body by the kidney's. Your caregiver may order blood work to check your kidney function and other laboratory values before the following tests are performed:  Magnetic Resonance Angiography (MRA). An MRA is a picture study of the blood vessels and arteries. The MRA machine uses a large magnet to produce images of the blood vessels.  Computed Tomography Angiography (CTA). A CTA is a specialized x-ray that looks at how the blood flows in your blood vessels. An IV may be inserted into your arm so contrast dye   can be injected.  Angiogram. Is a procedure that uses x-rays to look at your blood vessels. This procedure is minimally invasive, meaning a small incision (cut) is made in your groin. A small tube (catheter) is then inserted into the artery of your groin. The catheter is guided to the blood vessel or artery your caregiver wants to examine. Contrast dye is injected into the catheter. X-rays are then taken of the blood vessel or artery. After the images are obtained, the catheter is taken out. TREATMENT  Treatment of PVD involves many interventions which may include:  Lifestyle changes:  Quitting smoking.  Exercise.  Following a low fat, low cholesterol diet.  Control of diabetes.  Foot care is very  important to the PVD patient. Good foot care can help prevent infection.  Medication:  Cholesterol-lowering medicine.  Blood pressure medicine.  Anti-platelet drugs.  Certain medicines may reduce symptoms of Intermittent Claudication.  Interventional/Surgical options:  Angioplasty. An Angioplasty is a procedure that inflates a balloon in the blocked artery. This opens the blocked artery to improve blood flow.  Stent Implant. A wire mesh tube (stent) is placed in the artery. The stent expands and stays in place, allowing the artery to remain open.  Peripheral Bypass Surgery. This is a surgical procedure that reroutes the blood around a blocked artery to help improve blood flow. This type of procedure may be performed if Angioplasty or stent implants are not an option. SEEK IMMEDIATE MEDICAL CARE IF:   You develop pain or numbness in your arms or legs.  Your arm or leg turns cold, becomes blue in color.  You develop redness, warmth, swelling and pain in your arms or legs. MAKE SURE YOU:   Understand these instructions.  Will watch your condition.  Will get help right away if you are not doing well or get worse. Document Released: 10/25/2004 Document Revised: 12/10/2011 Document Reviewed: 09/21/2008 [facility] Patient Information 2014 [place], [place].   Abdominal Aortic Aneurysm An aneurysm is a weakened or damaged part of an artery wall that bulges from the normal force of blood pumping through the body. An abdominal aortic aneurysm is an aneurysm that occurs in the lower part of the aorta, the main artery of the body.  The major concern with an abdominal aortic aneurysm is that it can enlarge and burst (rupture) or blood can flow between the layers of the wall of the aorta through a tear (aorticdissection). Both of these conditions can cause bleeding inside the body and can be life threatening unless diagnosed and treated promptly. CAUSES  The exact cause of an abdominal aortic  aneurysm is unknown. Some contributing factors are:   A hardening of the arteries caused by the buildup of fat and other substances in the lining of a blood vessel (arteriosclerosis).  Inflammation of the walls of an artery (arteritis).   Connective tissue diseases, such as Marfan syndrome.   Abdominal trauma.   An infection, such as syphilis or staphylococcus, in the wall of the aorta (infectious aortitis) caused by bacteria. RISK FACTORS  Risk factors that contribute to an abdominal aortic aneurysm may include:  Age older than 82 years.   High blood pressure (hypertension).  Male gender.  Ethnicity (white race).  Obesity.  Family history of aneurysm (first degree relatives only).  Tobacco use. PREVENTION  The following healthy lifestyle habits may help decrease your risk of abdominal aortic aneurysm:  Quitting smoking. Smoking can raise your blood pressure and cause arteriosclerosis.  Limiting or avoiding alcohol.  Keeping your blood pressure, blood sugar level, and cholesterol levels within normal limits.  Decreasing your salt intake. In somepeople, too much salt can raise blood pressure and increase your risk of abdominal aortic aneurysm.  Eating a diet low in saturated fats and cholesterol.  Increasing your fiber intake by including whole grains, vegetables, and fruits in your diet. Eating these foods may help lower blood pressure.  Maintaining a healthy weight.  Staying physically active and exercising regularly. SYMPTOMS  The symptoms of abdominal aortic aneurysm may vary depending on the size and rate of growth of the aneurysm.Most grow slowly and do not have any symptoms. When symptoms do occur, they may include:  Pain (abdomen, side, lower back, or groin). The pain may vary in intensity. A sudden onset of severe pain may indicate that the aneurysm has ruptured.  Feeling full after eating only small amounts of food.  Nausea or vomiting or  both.  Feeling a pulsating lump in the abdomen.  Feeling faint or passing out. DIAGNOSIS  Since most unruptured abdominal aortic aneurysms have no symptoms, they are often discovered during diagnostic exams for other conditions. An aneurysm may be found during the following procedures:  Ultrasonography (A one-time screening for abdominal aortic aneurysm by ultrasonography is also recommended for all men aged 5-75 years who have ever smoked).  X-ray exams.  A computed tomography (CT).  Magnetic resonance imaging (MRI).  Angiography or arteriography. TREATMENT  Treatment of an abdominal aortic aneurysm depends on the size of your aneurysm, your age, and risk factors for rupture. Medication to control blood pressure and pain may be used to manage aneurysms smaller than 6 cm. Regular monitoring for enlargement may be recommended by your caregiver if:  The aneurysm is 3 4 cm in size (an annual ultrasonography may be recommended).  The aneurysm is 4 4.5 cm in size (an ultrasonography every 6 months may be recommended).  The aneurysm is larger than 4.5 cm in size (your caregiver may ask that you be examined by a vascular surgeon). If your aneurysm is larger than 6 cm, surgical repair may be recommended. There are two main methods for repair of an aneurysm:   Endovascular repair (a minimally invasive surgery). This is done most often.  Open repair. This method is used if an endovascular repair is not possible. Document Released: 06/27/2005 Document Revised: 01/12/2013 Document Reviewed: 10/17/2012 [facility] Patient Information 2014 [place], [place].

## 2014-01-20 NOTE — Progress Notes (Signed)
[facility] [place] HISTORY AND PHYSICAL   MRN : [id number]  History of Present Illness:   Troy [name] is a 73 y.o. male  patient that has been followed for many years by Dr. [name] for peripheral arterial disease and a small abdominal aortic aneurysm. He is on a statin aspirin and Plavix. He had a fairly normal stress test and Echo in March of this year. This was done by Dr. Benjamine Mola. He does have a history of some ectopic beats. He has a remote history of atrial fibrillation. Other medical problems include hypertension hyperlipidemia glaucoma abdominal aortic aneurysm (3.4 cm in January 2014) and remote history of lung cancer all of which are currently. He is also s/p left CEA on 07/14/13 by Dr. [name]. He returns today for 1 year follow up for ABI's, aorto-iliac Duplex, and carotid Duplex. After about 1/2 mile he develops bilateral buttocks and calve pain, denies non healing wounds. He denies any stroke activity since his stroke before the CEA. He denies back or abdominal pain.  Pt Diabetic: No Pt smoker: former smoker, quit October, 2014 years ago  Current Outpatient Prescriptions  Medication Sig Dispense Refill  . aspirin 81 MG chewable tablet Chew 1 tablet (81 mg total) by mouth daily.  20 tablet  0  . bimatoprost (LUMIGAN) 0.03 % ophthalmic solution Place 1 drop into both eyes at bedtime.      . brimonidine (ALPHAGAN P) 0.1 % SOLN Place 3 drops into the left eye 3 (three) times daily.       [name] CARDURA 4 MG tablet TAKE (1/2) TABLET DAILY.  45 tablet  1  . cilostazol (PLETAL) 100 MG tablet Take 1 tablet (100 mg total) by mouth daily.  30 tablet  0  . dorzolamide-timolol (COSOPT) 22.3-6.8 MG/ML ophthalmic solution Place 2 drops into the right eye 2 (two) times daily.      [name] doxycycline (VIBRA-TABS) 100 MG tablet Take 1 tablet (100 mg total) by mouth 2 (two) times daily.  14 tablet  0  . FLUoxetine (PROZAC) 10 MG capsule Take 1 capsule (10 mg total) by mouth daily.   30 capsule  2  . MELATONIN PO Take 1 tablet by mouth at bedtime as needed (sleep).       . Multiple Vitamins-Minerals (CENTRUM SILVER PO) Take 1 tablet by mouth daily.      . naproxen sodium (ANAPROX) 220 MG tablet Take 440 mg by mouth daily as needed (pain).      [name] PLAVIX 75 MG tablet TAKE 1 TABLET EACH DAY.  30 tablet  5  . ramipril (ALTACE) 2.5 MG capsule TAKE (1) CAPSULE DAILY.  30 capsule  5  . rosuvastatin (CRESTOR) 40 MG tablet Take 1 tablet (40 mg total) by mouth daily.  90 tablet  1  . tiotropium (SPIRIVA HANDIHALER) 18 MCG inhalation capsule Place 1 capsule (18 mcg total) into inhaler and inhale daily.  30 capsule  12   No current facility-administered medications for this visit.    Pt meds include: Statin :Yes ASA: Yes Other anticoagulants/antiplatelets: Plavix, Pletal  Past Medical History  Diagnosis Date  . Hypertension   . Hyperlipidemia   . Glaucoma BOTH EYES    Dr Gershon Crane  . Hx of adenomatous colonic polyps 2005     X 2; 1 hyperplastic polyp; Dr Olevia Perches  . Impaired fasting glucose 2007    108; A1c5.4%  . History of lung cancer APRIL 2012  SQUAMOUS CELL---- S/P RIGHT LOWER LOBECTOMY  AT [facility] --  NO CHEMORADIATION---  NO RECURRENCE     ONCOLOGIST- DR Tressie Stalker  LOV IN [facility] 10-27-2012  . GERD (gastroesophageal reflux disease)   . BPH (benign prostatic hypertrophy)   . Frequency of urination   . Urgency of urination   . Nocturia   . Lesion of bladder   . Microhematuria   . Arthritis   . History of basal cell carcinoma excision   . Exertional dyspnea   . Peripheral vascular disease S/P ANGIOPLASTY AND STENTING    FOLLOWED  BY DR [name]  . PAD (peripheral artery disease) ABI'S  JAN 2014  0.65 ON RIGHT ;  1.04 ON LEFT  . AAA (abdominal aortic aneurysm) LAST ABD. [name]  JAN 2014  3.4CM    MONITORED BY DR [name]  . Bilateral carotid artery stenosis DUPLEX 12-29-2012  BY DR [facility]    BILATERAL ICA STENOSIS 60-79%  . COPD (chronic obstructive pulmonary disease)   .  Stroke Jul 07, 2013    mini  TIA  . Cancer 01-25-12    LUNG    Past Surgical History  Procedure Laterality Date  . Angio plasty      X 4 in legs  . Colon polyectomy    . Trabecular surgery      OS  . Lung lobectomy  01/24/2011     RIGHT UPPER LOBE  (SQUAMOUS CELL CARCINOMA) Dr Dorthea Cove , [facility]. No chemotherapyor radiation  . Laryngoscopy  06-27-2004    BX VOCAL CORD  (LEUKOPLAKIA)  PER PT NO ISSUES SINCE  . Cataract extraction w/ intraocular lens  implant, bilateral    . Basal cell carcinoma excision      MULTIPLE TIMES--  RIGHT FOREARM, CHEEKS, AND BACK  . Femoral-popliteal bypass graft Left 1994  [facility]    AND 2001  IN [place]  . Cardiovascular stress test  12-08-2012  DR [facility]    NORMAL LEXISCAN WITH NO EXERCISE NUCLEAR STUDY/ EF 66%/   NO ISCHEMIA/ NO SIGNIFICANT CHANGE FROM PRIOR STUDY  . Transthoracic echocardiogram  12-29-2012  DR [facility]    MILD LVH/  LVSF NORMAL/ EF 70-62%/  GRADE I DIASTOLIC DYSFUNCTION  . Aortogram  07-27-2002    MILD DIFFUSE ILIAC ARTERY OCCLUSIVE DISEASE /  LEFT RENAL ARTERY 20%/ PATENT LEFT FEM-POP GRAFT/ MILD SFA AND POPLITEAL ARTERY OCCLUSIVE DISEASE W/ SEVERE KIDNEY OCCLUSIVE DISEASE  . Cystoscopy w/ retrogrades Bilateral 01/21/2013    Procedure: CYSTOSCOPY WITH RETROGRADE PYELOGRAM;  Surgeon: Molli Hazard, MD;  Location: [facility];  Service: Urology;  Laterality: Bilateral;   CYSTO, BLADDER BIOPSY, BILATERAL RETROGRADE PYELOGRAM  RAD TECH FROM RADIOLOGY PER JOY  . Cystoscopy with biopsy N/A 01/21/2013    Procedure: CYSTOSCOPY WITH BIOPSY;  Surgeon: Molli Hazard, MD;  Location: [facility];  Service: Urology;  Laterality: N/A;  . Endarterectomy Right 07/14/2013    Procedure: ENDARTERECTOMY CAROTID;  Surgeon: Angelia Mould, MD;  Location: [place];  Service: Vascular;  Laterality: Right;  . Patch angioplasty Right 07/14/2013    Procedure: PATCH ANGIOPLASTY;  Surgeon: Angelia Mould, MD;   Location: [facility];  Service: Vascular;  Laterality: Right;  . Carotid endarterectomy Right 07-14-13    cea    Social History History  Substance Use Topics  . Smoking status: Former Smoker -- 0.50 packs/day for 50 years    Types: Cigarettes  . Smokeless tobacco: Never Used     Comment: pt states that he is now taking chantix  .  Alcohol Use: Yes     Comment:  socially, variable    Family History Family History  Problem Relation Age of Onset  . Stroke Mother     mini strokes  . Alcohol abuse Father   . Heart disease Father     MI after 41  . Stroke Father   . Hypertension Father   . Heart attack Father   . Heart disease Paternal Aunt     several  . Hypertension Paternal Aunt     several  . Stroke Paternal Aunt     several  . Stroke Paternal Uncle     several  . Heart disease Paternal Uncle     several;2 had MI pre 69   Allergies  Allergen Reactions  . Penicillins Shortness Of Breath    Flushing  & dyspnea Because of a history of documented adverse serious drug reaction;Medi Alert bracelet  is recommended  . Sulfonamide Derivatives Shortness Of Breath    Flushing & dyspnea Because of a history of documented adverse serious drug reaction;Medi Alert bracelet  is recommended  . Niacin-Lovastatin Er Other (See Comments)    Dyspnea, flushing  . Ramipril     cough  . Atorvastatin Other (See Comments)    Myalgias & athralgias     REVIEW OF SYSTEMS: See HPI for pertinent positives and negatives.  Physical Examination Filed Vitals:   01/20/14 1045 01/20/14 1048  BP: 128/69 118/72  Pulse: 65 63  Resp:  16  Height:  6\' 1"  (1.854 m)  Weight:  204 lb (92.534 kg)  SpO2:  98%  Body mass index is 26.92 kg/(m^2).  General:  WDWN in NAD Gait: Normal HENT: WNL Eyes: Pupils equal Pulmonary: normal non-labored breathing , without Rales, rhonchi,  wheezing Cardiac: RRR, no murmurs detected  Abdomen: soft, NT, no masses Skin: no rashes, ulcers noted;  no Gangrene , no  cellulitis; no open wounds;   VASCULAR EXAM  Carotid Bruits Left Right   Negative Negative                             VASCULAR EXAM: Extremities without ischemic changes  without Gangrene; without open wounds; without drainage.                                                                                                          LE Pulses LEFT RIGHT       FEMORAL   palpable   palpable        POPLITEAL  not palpable   not palpable       POSTERIOR TIBIAL  not palpable   not palpable        DORSALIS PEDIS      ANTERIOR TIBIAL not palpable  not palpable    Musculoskeletal: no muscle wasting or atrophy; no edema  Neurologic: A&O X 3; Appropriate Affect ;  SENSATION: normal; MOTOR FUNCTION: 5/5 Symmetric, CN 2-12 intact except for uvula deviation slightly to the left Speech is fluent/normal   Significant  Diagnostic Studies:   Non-Invasive Vascular Imaging (01/20/2014):  CEREBROVASCULAR DUPLEX EVALUATION    INDICATION: Carotid artery disease     PREVIOUS INTERVENTION(S): Right carotid endarterectomy 07/14/2013.    DUPLEX EXAM:     RIGHT  LEFT  Peak Systolic Velocities (cm/s) End Diastolic Velocities (cm/s) Plaque LOCATION Peak Systolic Velocities (cm/s) End Diastolic Velocities (cm/s) Plaque  96 14  CCA PROXIMAL 121 19   92 16  CCA MID 102 15   78 14  CCA DISTAL 99 17   157   ECA 172 6   84 19  ICA PROXIMAL 149 40 HT  97 39  ICA MID 94 25   100 30  ICA DISTAL 97 29      ICA / CCA Ratio (PSV) 1.46  Antegrade  Vertebral Flow Antegrade   956 Brachial Systolic Pressure (mmHg) 213  Triphasic  Brachial Artery Waveforms Triphasic     Plaque Morphology:  HM = Homogeneous, HT = Heterogeneous, CP = Calcific Plaque, SP = Smooth Plaque, IP = Irregular Plaque     ADDITIONAL FINDINGS:     IMPRESSION: Patent right carotid endarterectomy site with no evidence of hyperplasia or restenosis.  Left internal carotid artery velocities suggest a 40-59% stenosis (low end of  range).    ABDOMINAL AORTA DUPLEX EVALUATION    INDICATION: Evaluation of abdominal aorta.    PREVIOUS INTERVENTION(S):     DUPLEX EXAM:     LOCATION DIAMETER AP (cm) DIAMETER TRANSVERSE (cm) VELOCITIES (cm/sec)  Aorta Proximal 2.11 1.96 50  Aorta Mid 1.99 1.92 47  Aorta Distal 3.25 3.27 46  Right Common Iliac Artery 1.01 1.03 249  Left Common Iliac Artery Not Visualized  Not Visualized      Previous max aortic diameter:  3.2 x 3.4 Date: 10/22/2012     ADDITIONAL FINDINGS:     IMPRESSION: Abdominal aortic aneurysm measuring 3.25 x 3.27 cm at the maximum diameter. Elevated velocity in the right common iliac artery.    Compared to the previous exam:  No significant change in comparison to the last exam on 10/22/2012.    ASSESSMENT:  Troy [name] is a 73 y.o. male patient that Dr. [name] has been following for many years for peripheral arterial disease and a small abdominal aortic aneurysm. He is also s/p left CEA on 07/14/13 by Dr. [name]. No further stroke activity. Patent right carotid endarterectomy site with no evidence of hyperplasia or restenosis.  Left internal carotid artery velocities suggest a 40-59% stenosis (low end of range). Small asymptomatic aortic aneurysm. He has claudication symptoms in both buttocks and calves after walking about 1/2 mile, but he appears to not walk on a regular basis.   PLAN:   Graduated walking program. Management of modifiable risk factors. Based on today's exam and non-invasive vascular lab results, the patient will follow up in 6 months for ABI's and carotid Duplex, 1 year for AAA Duplex. I discussed in depth with the patient the nature of atherosclerosis, and emphasized the importance of maximal medical management including strict control of blood pressure, blood glucose, and lipid levels, obtaining regular exercise, and continued cessation of smoking.  The patient is aware that without maximal medical management the underlying  atherosclerotic disease process will progress, limiting the benefit of any interventions. The patient was given information about stroke prevention and what symptoms should prompt the patient to seek immediate medical care. The patient was given information about AAA including signs, symptoms, treatment,  what symptoms should prompt the patient  to seek immediate medical care, and how to minimize the risk of enlargement and rupture of aneurysms. The patient was given information about PAD including signs, symptoms, treatment, what symptoms should prompt the patient to seek immediate medical care, and risk reduction measures to take. Thank you for allowing [name] to participate in this patient's care.  Clemon Chambers, RN, MSN, FNP-C [facility] Office: [phone]  Clinic MD: [name] 01/20/2014 10:45 AM

## 2014-01-25 ENCOUNTER — Ambulatory Visit (INDEPENDENT_AMBULATORY_CARE_PROVIDER_SITE_OTHER): Payer: BC Managed Care – PPO | Admitting: *Deleted

## 2014-01-25 DIAGNOSIS — E782 Mixed hyperlipidemia: Secondary | ICD-10-CM

## 2014-01-25 DIAGNOSIS — I739 Peripheral vascular disease, unspecified: Secondary | ICD-10-CM

## 2014-01-25 LAB — BASIC METABOLIC PANEL
BUN: 15 mg/dL (ref 6–23)
CHLORIDE: 105 meq/L (ref 96–112)
CO2: 28 mEq/L (ref 19–32)
Calcium: 9.6 mg/dL (ref 8.4–10.5)
Creatinine, Ser: 1.1 mg/dL (ref 0.4–1.5)
GFR: 69.68 mL/min (ref >60.00)
Glucose, Bld: 103 mg/dL — ABNORMAL HIGH (ref 70–99)
POTASSIUM: 4.9 meq/L (ref 3.5–5.1)
Sodium: 140 mEq/L (ref 135–145)

## 2014-01-25 LAB — LIPID PANEL
Cholesterol: 130 mg/dL (ref 0–200)
HDL: 30 mg/dL — ABNORMAL LOW (ref >39.00)
LDL CALC: 76 mg/dL (ref 0–99)
TRIGLYCERIDES: 121 mg/dL (ref 0.0–149.0)
Total CHOL/HDL Ratio: 4
VLDL: 24.2 mg/dL (ref 0.0–40.0)

## 2014-01-25 LAB — HEPATIC FUNCTION PANEL
ALBUMIN: 3.7 g/dL (ref 3.5–5.2)
ALT: 15 U/L (ref 0–53)
AST: 18 U/L (ref 0–37)
Alkaline Phosphatase: 62 U/L (ref 39–117)
BILIRUBIN DIRECT: 0 mg/dL (ref 0.0–0.3)
TOTAL PROTEIN: 7.6 g/dL (ref 6.0–8.3)
Total Bilirubin: 0.6 mg/dL (ref 0.3–1.2)

## 2014-01-27 ENCOUNTER — Encounter: Payer: Self-pay | Admitting: Cardiology

## 2014-01-27 ENCOUNTER — Encounter (INDEPENDENT_AMBULATORY_CARE_PROVIDER_SITE_OTHER): Payer: Self-pay

## 2014-01-27 ENCOUNTER — Ambulatory Visit (INDEPENDENT_AMBULATORY_CARE_PROVIDER_SITE_OTHER): Payer: BC Managed Care – PPO | Admitting: Cardiology

## 2014-01-27 VITALS — BP 122/66 | HR 94 | Ht 73.0 in | Wt 205.0 lb

## 2014-01-27 DIAGNOSIS — E782 Mixed hyperlipidemia: Secondary | ICD-10-CM

## 2014-01-27 DIAGNOSIS — R0989 Other specified symptoms and signs involving the circulatory and respiratory systems: Secondary | ICD-10-CM

## 2014-01-27 DIAGNOSIS — I739 Peripheral vascular disease, unspecified: Secondary | ICD-10-CM

## 2014-01-27 DIAGNOSIS — I6529 Occlusion and stenosis of unspecified carotid artery: Secondary | ICD-10-CM

## 2014-01-27 DIAGNOSIS — I4891 Unspecified atrial fibrillation: Secondary | ICD-10-CM

## 2014-01-27 DIAGNOSIS — F17200 Nicotine dependence, unspecified, uncomplicated: Secondary | ICD-10-CM

## 2014-01-27 MED ORDER — APIXABAN 5 MG PO TABS: 5.0000 mg | ORAL_TABLET | Freq: Two times a day (BID) | ORAL | Status: DC

## 2014-01-27 MED ORDER — DILTIAZEM HCL ER COATED BEADS 120 MG PO CP24: 120.0000 mg | ORAL_CAPSULE | Freq: Every day | ORAL | Status: DC

## 2014-01-27 NOTE — Progress Notes (Signed)
Patient ID: Troy [name], male   DOB: [birth year], 73 y.o.   MRN: [id number] PCP: Dr. Linna Darner  73 yo with history of HTN, COPD, active smoking/COPD, carotid stenosis s/p right CEA, and PAD presents for cardiology followup.  He does not have known CAD but is at high risk for CAD based on his comorbidities.  Lexiscan Cardiolite in 3/14 showed no ischemia or infarction and echo in 3/14 showed normal EF.  He had a left fem-pop bypass at the [facility] in the '90s.  He is followed at [facility] for PAD, last ABIs in 4/15 showed mild disease on the left and moderate disease on the right. He has had right lower lobectomy for lung cancer.  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 He continues to stay off cigarettes.     No chest pain.  He has chronic right calf/thigh/buttocks claudication that is unchanged over the last few years and follows regularly at [facility].  He does not have significant exertional dyspnea walking, golfing, or hunting.  He has been working out with a Clinical research associate and does get some dyspnea with moderate to heavy exercise.  Patient is noted to be in atrial fibrillation this morning.  He had no idea that anything different was going on.  No tachypalpitations.  He has not felt worse recently.  He played golf yesterday with no problems.  Of note, he was apparently briefly in atrial fibrillation post-op after right lobectomy back in 2012.    Labs (3/13): LDL 75, HDL 35, K 4.1, creatinine 1.1 Labs (3/14): LDL 91, HDL 27 Labs (10/14): K 4.3, creatinine 0.98 Labs (4/15): K 4.9, creatinine 1.1, LDL 76, HDL 30  ECG: atrial fibrillation at 94  PMH: 1. HTN: ACEI cough.  2. COPD: Quit smoking 2014.  3. AAA: CT 1/13 with 3.0 cm AAA.  Abdominal US (1/14) with 3.4 cm AAA. Abdominal US (4/15) with 3.25 x 3.27 AAA.  4. Squamous cell lung cancer diagnosed 2/12.  Had right lower lobectomy in 4/12.  5. Hyperlipidemia 6. PAD: Left fem-pop bypass 1994.  ABIs (2/12) 0.62 on right, 0.95 on left. ABIs (1/14): 0.65  on right, 1.04 on left. ABIs (4/15) 0.66 right, 0.87 left.  7. Stress myoview 2004 was normal. Lexiscan Cardiolite (3/14) with EF 66%, no ischemia or infarction.  8. Echo (3/14): technically difficult with EF 55-60%, upper normal RV size.  9. Carotid stenosis: TIA 10/14.  Carotid dopplers with > 17% RICA, 61-60% LICA.  Patient had right CEA in 10/14. Carotids (4/15) patent right CEA, LICA 73-71% stenosis.  10. Atrial fibrillation: Paroxysmal. First noted after lobectomy in 4/12 (brief), recurrence in 4/15.   SH: Married, lives in [place].  Coin.  Quit smoking in 2014.   FH: No premature CAD  ROS: All systems reviewed and negative except as per HPI.   Current Outpatient Prescriptions  Medication Sig Dispense Refill  . bimatoprost (LUMIGAN) 0.03 % ophthalmic solution Place 1 drop into both eyes at bedtime.      . brimonidine (ALPHAGAN P) 0.1 % SOLN Place 3 drops into the left eye 3 (three) times daily.       [name] CARDURA 4 MG tablet TAKE (1/2) TABLET DAILY.  45 tablet  1  . cilostazol (PLETAL) 100 MG tablet Take 1 tablet (100 mg total) by mouth daily.  30 tablet  0  . dorzolamide-timolol (COSOPT) 22.3-6.8 MG/ML ophthalmic solution Place 2 drops into the right eye 2 (two) times daily.      [name]  FLUoxetine (PROZAC) 10 MG capsule Take 1 capsule (10 mg total) by mouth daily.  30 capsule  2  . MELATONIN PO Take 1 tablet by mouth at bedtime as needed (sleep).       . Multiple Vitamins-Minerals (CENTRUM SILVER PO) Take 1 tablet by mouth daily.      . naproxen sodium (ANAPROX) 220 MG tablet Take 440 mg by mouth daily as needed (pain).      . ramipril (ALTACE) 2.5 MG capsule TAKE (1) CAPSULE DAILY.  30 capsule  5  . rosuvastatin (CRESTOR) 40 MG tablet Take 1 tablet (40 mg total) by mouth daily.  90 tablet  1  . tiotropium (SPIRIVA HANDIHALER) 18 MCG inhalation capsule Place 1 capsule (18 mcg total) into inhaler and inhale daily.  30 capsule  12  . apixaban (ELIQUIS) 5 MG TABS tablet  Take 1 tablet (5 mg total) by mouth 2 (two) times daily.  60 tablet  1  . diltiazem (CARDIZEM CD) 120 MG 24 hr capsule Take 1 capsule (120 mg total) by mouth daily.  30 capsule  1   No current facility-administered medications for this visit.    BP 122/66  Pulse 94  Ht 6\' 1"  (1.854 m)  Wt 92.987 kg (205 lb)  BMI 27.05 kg/m2 General: NAD Neck: No JVD, no thyromegaly or thyroid nodule.  Lungs: Mildly decreased breath sounds bilaterally.  CV: Nondisplaced PMI.  Heart irregular S1/S2, no S3/S4, 1/6 SEM RUSB.  No peripheral edema.  No carotid bruit. Right foot is warm but unable to palpate PT pulse.   Abdomen: Soft, nontender, no hepatosplenomegaly, no distention.   Neurologic: Alert and oriented x 3.  Psych: Normal affect. Extremities: No clubbing or cyanosis.   Assessment/Plan: 1. Chest pain: He has had no further chest pain.  Lexiscan Cardiolite was negative in 3/14 and EF was preserved on echo.   2. Hyperlipidemia: Good lipids in 4/15.  3. Carotid stenosis: s/p R CEA.  Will follow with [facility] for residual LICA stenosis.  4. PAD: Stable claudication, recent ABIs suggestove .  No foot ulcerations or rest pain.  Followed at [facility]. He is on cilostazol.  5. AAA: Small and stable on recent US.    6. COPD: Dyspnea improved with Spiriva and now that he has stopped smoking.   7. Atrial fibrillation: Patient is in atrial fibrillation but we do not know how long this has been going on as he is fairly asymptomatic.  CHADSVASC 2.  I am going to have patient stop ASA and Plavix and start on Eliquis 5 mg bid for anticoagulation.  He will also start on diltiazem CD 120 mg daily for rate control (hold off on beta blocker with COPD).  I think that he likely warrants an attempt at cardioversion if he remains in atrial fibrillation.  I will see him back in 2 wks.  If he is still in atrial fibrillation, I will arrange for DCCV to be done after 4 wks of Eliquis.    Larey Dresser 01/27/2014

## 2014-01-27 NOTE — Patient Instructions (Signed)
Stop plavix.  Stop aspirin.   Start Eliquis 5mg  two times a day.   Start diltiazem 120mg  daily.   Your physician recommends that you schedule a follow-up appointment in: about 2 weeks with Dr Aundra Dubin.

## 2014-02-02 ENCOUNTER — Telehealth: Payer: Self-pay | Admitting: Cardiology

## 2014-02-02 NOTE — Telephone Encounter (Signed)
New message           Pt cannot make appt for 2 week f/u because of a funeral. Pt would like to come on the 15th of May. Can you work him in?

## 2014-02-02 NOTE — Telephone Encounter (Signed)
I cannot add anyone else on May 15th. You could try to see if a patient already scheduled on May 15th could reschedule to Troy [name] original appt spot on May 14th at 2:30PM. (I placed a hold on it) Then you could give Troy [name] the slot on Friday that you rescheduled the patient from Friday to Thursday. Thanks.

## 2014-02-11 ENCOUNTER — Ambulatory Visit: Payer: BC Managed Care – PPO | Admitting: Cardiology

## 2014-02-12 ENCOUNTER — Ambulatory Visit (INDEPENDENT_AMBULATORY_CARE_PROVIDER_SITE_OTHER): Payer: BC Managed Care – PPO | Admitting: Cardiology

## 2014-02-12 ENCOUNTER — Encounter: Payer: Self-pay | Admitting: Cardiology

## 2014-02-12 VITALS — BP 142/52 | HR 74 | Ht 73.0 in | Wt 204.0 lb

## 2014-02-12 DIAGNOSIS — I6529 Occlusion and stenosis of unspecified carotid artery: Secondary | ICD-10-CM

## 2014-02-12 DIAGNOSIS — I739 Peripheral vascular disease, unspecified: Secondary | ICD-10-CM

## 2014-02-12 DIAGNOSIS — E782 Mixed hyperlipidemia: Secondary | ICD-10-CM

## 2014-02-12 DIAGNOSIS — I714 Abdominal aortic aneurysm, without rupture, unspecified: Secondary | ICD-10-CM

## 2014-02-12 DIAGNOSIS — I4891 Unspecified atrial fibrillation: Secondary | ICD-10-CM

## 2014-02-12 NOTE — Patient Instructions (Addendum)
Your physician recommends that you schedule a follow-up appointment in: 3 months with Dr Aundra Dubin

## 2014-02-14 DIAGNOSIS — I4891 Unspecified atrial fibrillation: Secondary | ICD-10-CM | POA: Insufficient documentation

## 2014-02-14 NOTE — Progress Notes (Signed)
Patient ID: Troy [name], male   DOB: 1941-01-30, 73 y.o.   MRN: [id number] PCP: Dr. Linna Darner  73 yo with history of HTN, COPD, active smoking/COPD, carotid stenosis s/p right CEA, and PAD presents for cardiology followup.  He does not have known CAD but is at high risk for CAD based on his comorbidities.  Lexiscan Cardiolite in 3/14 showed no ischemia or infarction and echo in 3/14 showed normal EF.  He had a left fem-pop bypass at the [facility] in the '90s.  He is followed at [facility] for [facility], last ABIs in 4/15 showed mild disease on the left and moderate disease on the right. He has had right lower lobectomy for lung cancer.  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 He continues to stay off cigarettes.    No chest pain.  He has chronic right calf/thigh/buttocks claudication that is unchanged over the last few years and follows regularly at [facility].  He does not have significant exertional dyspnea walking, golfing, or hunting.  He has been working out with a Clinical research associate and does get some dyspnea with moderate to heavy exercise.  At last visit, patient was noted to be in atrial fibrillation.  He had no idea that anything different was going on.  No tachypalpitations.  Of note, he was apparently briefly in atrial fibrillation post-op after right lobectomy back in 2012.  I started him on apixaban and diltiazem CD at last appointment.  Today, he is back in NSR.   Labs (3/13): LDL 75, HDL 35, K 4.1, creatinine 1.1 Labs (3/14): LDL 91, HDL 27 Labs (10/14): K 4.3, creatinine 0.98 Labs (4/15): K 4.9, creatinine 1.1, LDL 76, HDL 30  ECG: NSR, lateral T wave flattening  PMH: 1. HTN: ACEI cough.  2. COPD: Quit smoking 2014.  3. AAA: CT 1/13 with 3.0 cm AAA.  Abdominal US (1/14) with 3.4 cm AAA. Abdominal US (4/15) with 3.25 x 3.27 AAA.  4. Squamous cell lung cancer diagnosed 2/12.  Had right lower lobectomy in 4/12.  5. Hyperlipidemia 6. PAD: Left fem-pop bypass 1994.  ABIs (2/12) 0.62 on right, 0.95 on  left. ABIs (1/14): 0.65 on right, 1.04 on left. ABIs (4/15) 0.66 right, 0.87 left.  7. Stress myoview 2004 was normal. Lexiscan Cardiolite (3/14) with EF 66%, no ischemia or infarction.  8. Echo (3/14): technically difficult with EF 55-60%, upper normal RV size.  9. Carotid stenosis: TIA 10/14.  Carotid dopplers with > 82% RICA, 95-62% LICA.  Patient had right CEA in 10/14. Carotids (4/15) patent right CEA, LICA 13-08% stenosis.  10. Atrial fibrillation: Paroxysmal. First noted after lobectomy in 4/12 (brief), recurrence in 4/15.   SH: Married, lives in [place].  [place].  Quit smoking in 2014.   FH: No premature CAD  ROS: All systems reviewed and negative except as per HPI.   Current Outpatient Prescriptions  Medication Sig Dispense Refill  . apixaban (ELIQUIS) 5 MG TABS tablet Take 1 tablet (5 mg total) by mouth 2 (two) times daily.  60 tablet  1  . bimatoprost (LUMIGAN) 0.03 % ophthalmic solution Place 1 drop into both eyes at bedtime.      . brimonidine (ALPHAGAN P) 0.1 % SOLN Place 3 drops into the left eye 3 (three) times daily.       [name] CARDURA 4 MG tablet TAKE (1/2) TABLET DAILY.  45 tablet  1  . cilostazol (PLETAL) 100 MG tablet Take 1 tablet (100 mg total)  by mouth daily.  30 tablet  0  . diltiazem (CARDIZEM CD) 120 MG 24 hr capsule Take 1 capsule (120 mg total) by mouth daily.  30 capsule  1  . dorzolamide-timolol (COSOPT) 22.3-6.8 MG/ML ophthalmic solution Place 2 drops into the right eye 2 (two) times daily.      [name] FLUoxetine (PROZAC) 10 MG capsule Take 1 capsule (10 mg total) by mouth daily.  30 capsule  2  . MELATONIN PO Take 1 tablet by mouth at bedtime as needed (sleep).       . Multiple Vitamins-Minerals (CENTRUM SILVER PO) Take 1 tablet by mouth daily.      . naproxen sodium (ANAPROX) 220 MG tablet Take 440 mg by mouth daily as needed (pain).      . ramipril (ALTACE) 2.5 MG capsule TAKE (1) CAPSULE DAILY.  30 capsule  5  . rosuvastatin (CRESTOR) 40 MG  tablet Take 1 tablet (40 mg total) by mouth daily.  90 tablet  1  . tiotropium (SPIRIVA HANDIHALER) 18 MCG inhalation capsule Place 1 capsule (18 mcg total) into inhaler and inhale daily.  30 capsule  12   No current facility-administered medications for this visit.    BP 142/52  Pulse 74  Ht 6\' 1"  (1.854 m)  Wt 92.534 kg (204 lb)  BMI 26.92 kg/m2 General: NAD Neck: No JVD, no thyromegaly or thyroid nodule.  Lungs: Mildly decreased breath sounds bilaterally.  CV: Nondisplaced PMI.  Heart irregular S1/S2, no S3/S4, 1/6 SEM RUSB.  No peripheral edema.  No carotid bruit. Right foot is warm but unable to palpate PT pulse.   Abdomen: Soft, nontender, no hepatosplenomegaly, no distention.   Neurologic: Alert and oriented x 3.  Psych: Normal affect. Extremities: No clubbing or cyanosis.   Assessment/Plan: 1. Chest pain: He has had no further chest pain.  Lexiscan Cardiolite was negative in 3/14 and EF was preserved on echo.   2. Hyperlipidemia: Good lipids in 4/15.  3. Carotid stenosis: s/p R CEA.  Will follow with [facility] for residual LICA stenosis.  4. PAD: Stable claudication, recent ABIs worse on the left.  No foot ulcerations or rest pain.  Followed at [facility]. He is on cilostazol.  5. AAA: Small and stable on recent US.    6. COPD: Dyspnea improved with Spiriva and now that he has stopped smoking.   7. Atrial fibrillation: Patient is back in NSR today.  He is fairly asymptomatic with the atrial fibrillation.  CHADSVASC 3 (age, vascular disease, HTN).  He is on Eliquis an off ASA/Plavix.  He is also on diltiazem CD 120 mg daily.    Larey Dresser 02/14/2014

## 2014-03-03 ENCOUNTER — Encounter: Payer: Self-pay | Admitting: Internal Medicine

## 2014-03-08 ENCOUNTER — Telehealth: Payer: Self-pay | Admitting: Cardiology

## 2014-03-08 NOTE — Telephone Encounter (Signed)
I am out of office next week.  Can fit him in this week if possible, otherwise see PA.  Think it would be good idea to have him start dronedarone 400 mg bid to try to keep him in NSR.  He has no CHF history.

## 2014-03-08 NOTE — Telephone Encounter (Signed)
New message     Pt was seen in the ER at [facility] [place]  .  They faxed over the hosp notes.  Did we get the records and when will Dr Aundra Dubin want to see the patient?

## 2014-03-08 NOTE — Telephone Encounter (Signed)
Wife calling stating she has faxed some records to [name] from his hospital treatment in [facility] on 5/20.  Went to ER for his AFib, HR high, SOB,fatigued and weakness. They did not change any of his meds at hospital.  She wanted to know when [name] wanted to see him.  She faxed the records about a week ago. States he is not having any Afib now but is still fatigued and weak which she says is normal for him. Advised will forward message to Eliot Ford and [name] to advise when he needed to be seen.

## 2014-03-09 ENCOUNTER — Other Ambulatory Visit: Payer: Self-pay | Admitting: *Deleted

## 2014-03-09 MED ORDER — DRONEDARONE HCL 400 MG PO TABS: 400.0000 mg | ORAL_TABLET | Freq: Two times a day (BID) | ORAL | Status: DC

## 2014-03-09 NOTE — Telephone Encounter (Signed)
Returned call to patient's wife no answer.LMTC. 

## 2014-03-09 NOTE — Telephone Encounter (Signed)
Yes, this is in addition to the diltiazem.

## 2014-03-09 NOTE — Telephone Encounter (Signed)
Spoke with Mrs. Ksiazek. [name] had opening this Thurs, 9:45am.   Informed to start dronedarone 400 mg bid Order placed. Mrs. Koska wants to be sure [name] wants pt to take this as well as Cardizem CD.

## 2014-03-09 NOTE — Telephone Encounter (Signed)
Informed Troy [name]. She verbalizes understanding.

## 2014-03-11 ENCOUNTER — Encounter: Payer: Self-pay | Admitting: Cardiology

## 2014-03-11 ENCOUNTER — Ambulatory Visit (INDEPENDENT_AMBULATORY_CARE_PROVIDER_SITE_OTHER): Payer: BC Managed Care – PPO | Admitting: Cardiology

## 2014-03-11 VITALS — BP 122/64 | HR 61 | Ht 73.0 in | Wt 206.0 lb

## 2014-03-11 DIAGNOSIS — I739 Peripheral vascular disease, unspecified: Secondary | ICD-10-CM

## 2014-03-11 DIAGNOSIS — E782 Mixed hyperlipidemia: Secondary | ICD-10-CM

## 2014-03-11 DIAGNOSIS — I6529 Occlusion and stenosis of unspecified carotid artery: Secondary | ICD-10-CM

## 2014-03-11 DIAGNOSIS — I4891 Unspecified atrial fibrillation: Secondary | ICD-10-CM

## 2014-03-11 LAB — BASIC METABOLIC PANEL
BUN: 27 mg/dL — ABNORMAL HIGH (ref 6–23)
CHLORIDE: 105 meq/L (ref 96–112)
CO2: 26 mEq/L (ref 19–32)
CREATININE: 1.3 mg/dL (ref 0.4–1.5)
Calcium: 9.7 mg/dL (ref 8.4–10.5)
GFR: 56.94 mL/min — ABNORMAL LOW (ref >60.00)
Glucose, Bld: 105 mg/dL — ABNORMAL HIGH (ref 70–99)
POTASSIUM: 4.4 meq/L (ref 3.5–5.1)
Sodium: 138 mEq/L (ref 135–145)

## 2014-03-11 NOTE — Patient Instructions (Signed)
Your physician recommends that you have  lab work today--BMET.  Your physician recommends that you schedule a follow-up appointment in: 1 month with Dr Aundra Dubin.

## 2014-03-12 NOTE — Progress Notes (Signed)
Patient ID: Troy [name], male   DOB: August 12, 1941, 73 y.o.   MRN: [id number] PCP: Dr. Linna Darner  73 yo with history of HTN, COPD, active smoking/COPD, carotid stenosis s/p right CEA, and PAD presents for cardiology followup.  He does not have known CAD but is at high risk for CAD based on his comorbidities.  Lexiscan Cardiolite in 3/14 showed no ischemia or infarction and echo in 3/14 showed normal EF.  He had a left fem-pop bypass at the [facility] in the '90s.  He is followed at [facility] for [facility]AD, last ABIs in 4/15 showed mild disease on the left and moderate disease on the right. He has chronic right calf/thigh/buttocks claudication that is unchanged over the last few years and follows regularly at [facility].  He has had right lower lobectomy for lung cancer.  He continues to stay off cigarettes.    At a prior appointment, he was in atrial fibrillation but did not realize it.  I started him on Eliquis and diltiazem CD, and he spontaneously converted to NSR.  In 5/15, he was at [facility] and felt "strange" one day: fatigued, weak, short of breath.  He went to the ER and was in atrial fibrillation with HR in 80s-90s.  He spontaneously converted to NSR in the ER.  He felt back to normal after converting to NSR.  He has had no chest pain and is able to walk without significant exertional dyspnea.  I started him on Multaq 400 mg bid.  He thinks that this may be making him more fatigued.  Weight is up 2 lbs.  Labs (3/13): LDL 75, HDL 35, K 4.1, creatinine 1.1 Labs (3/14): LDL 91, HDL 27 Labs (10/14): K 4.3, creatinine 0.98 Labs (4/15): K 4.9, creatinine 1.1, LDL 76, HDL 30 Labs (5/15): K 4.3, creatinine 1.7, BNP 251  ECG: NSR, normal  PMH: 1. HTN: ACEI cough.  2. COPD: Quit smoking 2014.  3. AAA: CT 1/13 with 3.0 cm AAA.  Abdominal US (1/14) with 3.4 cm AAA. Abdominal US (4/15) with 3.25 x 3.27 AAA.  4. Squamous cell lung cancer diagnosed 2/12.  Had right lower lobectomy in 4/12.  5. Hyperlipidemia 6. PAD: Left  fem-pop bypass 1994.  ABIs (2/12) 0.62 on right, 0.95 on left. ABIs (1/14): 0.65 on right, 1.04 on left. ABIs (4/15) 0.66 right, 0.87 left.  7. Stress myoview 2004 was normal. Lexiscan Cardiolite (3/14) with EF 66%, no ischemia or infarction.  8. Echo (3/14): technically difficult with EF 55-60%, upper normal RV size.  9. Carotid stenosis: TIA 10/14.  Carotid dopplers with > 50% RICA, 56-97% LICA.  Patient had right CEA in 10/14. Carotids (4/15) patent right CEA, LICA 94-80% stenosis.  10. Atrial fibrillation: Paroxysmal. First noted after lobectomy in 4/12 (brief), recurrence in 4/15 then in 5/15.   SH: Married, lives in [place].  [place].  Quit smoking in 2014.   FH: No premature CAD  ROS: All systems reviewed and negative except as per HPI.   Current Outpatient Prescriptions  Medication Sig Dispense Refill  . apixaban (ELIQUIS) 5 MG TABS tablet Take 1 tablet (5 mg total) by mouth 2 (two) times daily.  60 tablet  1  . bimatoprost (LUMIGAN) 0.03 % ophthalmic solution Place 1 drop into both eyes at bedtime.      . brimonidine (ALPHAGAN P) 0.1 % SOLN Place 3 drops into the left eye 3 (three) times daily.       [name] CARDURA 4  MG tablet TAKE (1/2) TABLET DAILY.  45 tablet  1  . cilostazol (PLETAL) 100 MG tablet Take 1 tablet (100 mg total) by mouth daily.  30 tablet  0  . diltiazem (CARDIZEM CD) 120 MG 24 hr capsule Take 1 capsule (120 mg total) by mouth daily.  30 capsule  1  . dorzolamide-timolol (COSOPT) 22.3-6.8 MG/ML ophthalmic solution Place 2 drops into the right eye 2 (two) times daily.      [name] dronedarone (MULTAQ) 400 MG tablet Take 1 tablet (400 mg total) by mouth 2 (two) times daily with a meal.  60 tablet  6  . FLUoxetine (PROZAC) 10 MG capsule Take 1 capsule (10 mg total) by mouth daily.  30 capsule  2  . MELATONIN PO Take 1 tablet by mouth at bedtime as needed (sleep).       . Multiple Vitamins-Minerals (CENTRUM SILVER PO) Take 1 tablet by mouth daily.      .  ramipril (ALTACE) 2.5 MG capsule TAKE (1) CAPSULE DAILY.  30 capsule  5  . rosuvastatin (CRESTOR) 40 MG tablet Take 1 tablet (40 mg total) by mouth daily.  90 tablet  1  . tiotropium (SPIRIVA HANDIHALER) 18 MCG inhalation capsule Place 1 capsule (18 mcg total) into inhaler and inhale daily.  30 capsule  12  . naproxen sodium (ANAPROX) 220 MG tablet Take 440 mg by mouth daily as needed (pain).       No current facility-administered medications for this visit.    BP 122/64  Pulse 61  Ht 6\' 1"  (1.854 m)  Wt 93.441 kg (206 lb)  BMI 27.18 kg/m2 General: NAD Neck: JVP 7-8 cm, no thyromegaly or thyroid nodule.  Lungs: Mildly decreased breath sounds right base.  CV: Nondisplaced PMI.  Heart irregular S1/S2, no S3/S4, 1/6 SEM RUSB.  Trace ankle edema.  No carotid bruit. Right foot is warm but unable to palpate PT pulse.   Abdomen: Soft, nontender, no hepatosplenomegaly, no distention.   Neurologic: Alert and oriented x 3.  Psych: Normal affect. Extremities: No clubbing or cyanosis.   Assessment/Plan: 1. Chest pain: He has had no further chest pain.  Lexiscan Cardiolite was negative in 3/14 and EF was preserved on echo.   2. Hyperlipidemia: Good lipids in 4/15.  3. Carotid stenosis: s/p R CEA.  Will follow with [facility] for residual LICA stenosis.  4. PAD: Stable claudication, recent ABIs worse on the left.  No foot ulcerations or rest pain.  Followed at [facility]. He is on cilostazol.  5. AAA: Small and stable on recent US.    6. COPD: Dyspnea improved with Spiriva and now that he has stopped smoking.   7. Atrial fibrillation: Patient is back in NSR today after another atrial fibrillation recurrence, this time symptomatic.  CHADSVASC 3 (age, vascular disease, HTN).  He is also on diltiazem CD 120 mg daily.  I have started him on dronedarone 400 mg bid to try to keep him out of atrial fibrillation.  The first day he took it he felt fatigued.  I would like him to continue it for now, making sure to take it  with meals.  If he has breakthrough, may be a good atrial fibrillation ablation candidate.   8. AKI: At ER in [facility], creatinine was up.  He thinks he was dehydrated that day.  I will recheck BMET today.    [name] 03/12/2014

## 2014-03-15 ENCOUNTER — Telehealth: Payer: Self-pay

## 2014-03-15 DIAGNOSIS — F32A Depression, unspecified: Secondary | ICD-10-CM

## 2014-03-15 DIAGNOSIS — F329 Major depressive disorder, single episode, unspecified: Secondary | ICD-10-CM

## 2014-03-15 MED ORDER — FLUOXETINE HCL 20 MG PO TABS: 20.0000 mg | ORAL_TABLET | Freq: Every day | ORAL | Status: DC

## 2014-03-15 NOTE — Telephone Encounter (Signed)
Patient 's wife has been notified

## 2014-03-15 NOTE — Telephone Encounter (Signed)
Patient wife called requesting for MD to increase current dose of fluoxetine for better results. Thanks

## 2014-03-15 NOTE — Telephone Encounter (Signed)
Increase Fluoxetine to 20 mg qd #30, RX2

## 2014-03-22 ENCOUNTER — Other Ambulatory Visit: Payer: Self-pay | Admitting: Cardiology

## 2014-03-29 ENCOUNTER — Other Ambulatory Visit: Payer: Self-pay | Admitting: Cardiology

## 2014-04-12 ENCOUNTER — Other Ambulatory Visit: Payer: Self-pay | Admitting: Internal Medicine

## 2014-04-22 ENCOUNTER — Ambulatory Visit (INDEPENDENT_AMBULATORY_CARE_PROVIDER_SITE_OTHER): Payer: BC Managed Care – PPO | Admitting: Cardiology

## 2014-04-22 ENCOUNTER — Encounter: Payer: Self-pay | Admitting: Cardiology

## 2014-04-22 VITALS — BP 130/74 | HR 56 | Ht 73.0 in | Wt 202.0 lb

## 2014-04-22 DIAGNOSIS — I714 Abdominal aortic aneurysm, without rupture, unspecified: Secondary | ICD-10-CM

## 2014-04-22 DIAGNOSIS — I6529 Occlusion and stenosis of unspecified carotid artery: Secondary | ICD-10-CM

## 2014-04-22 DIAGNOSIS — I739 Peripheral vascular disease, unspecified: Secondary | ICD-10-CM

## 2014-04-22 DIAGNOSIS — I4891 Unspecified atrial fibrillation: Secondary | ICD-10-CM

## 2014-04-22 DIAGNOSIS — E782 Mixed hyperlipidemia: Secondary | ICD-10-CM

## 2014-04-22 DIAGNOSIS — J438 Other emphysema: Secondary | ICD-10-CM

## 2014-04-22 DIAGNOSIS — J439 Emphysema, unspecified: Secondary | ICD-10-CM

## 2014-04-22 DIAGNOSIS — I480 Paroxysmal atrial fibrillation: Secondary | ICD-10-CM

## 2014-04-22 NOTE — Patient Instructions (Signed)
Your physician recommends that you schedule a follow-up appointment in: 4 months with Dr Aundra Dubin.

## 2014-04-24 NOTE — Progress Notes (Signed)
Patient ID: Troy [name], male   DOB: 20-May-1941, 73 y.o.   MRN: [id number] PCP: Dr. Linna Darner  73 yo with history of HTN, COPD, active smoking/COPD, carotid stenosis s/p right CEA, and PAD presents for cardiology followup.  He does not have known CAD but is at high risk for CAD based on his comorbidities.  Lexiscan Cardiolite in 3/14 showed no ischemia or infarction and echo in 3/14 showed normal EF.  He had a left fem-pop bypass at the [facility] in the '90s.  He is followed at [facility] for [facility]AD, last ABIs in 4/15 showed mild disease on the left and moderate disease on the right. He has chronic right calf/thigh/buttocks claudication that is unchanged over the last few years and follows regularly at [facility].  He has had right lower lobectomy for lung cancer.  He continues to stay off cigarettes.    At a prior appointment, he was in atrial fibrillation but did not realize it.  I started him on Eliquis and diltiazem CD, and he spontaneously converted to NSR.  In 5/15, he was at [facility] and felt "strange" one day: fatigued, weak, short of breath.  He went to the ER and was in atrial fibrillation with HR in 80s-90s.  He spontaneously converted to NSR in the ER.  He felt back to normal after converting to NSR.  He has had no chest pain and is able to walk without significant exertional dyspnea.  I started him on Multaq 400 mg bid.  He has remained in NSR since then.  Weight is down 4 lbs.  He is playing golf.  Labs (3/13): LDL 75, HDL 35, K 4.1, creatinine 1.1 Labs (3/14): LDL 91, HDL 27 Labs (10/14): K 4.3, creatinine 0.98 Labs (4/15): K 4.9, creatinine 1.1, LDL 76, HDL 30 Labs (5/15): K 4.3, creatinine 1.7, BNP 251 Labs (6/15): K 4.4, creatinine 1.3  ECG: NSR, normal  PMH: 1. HTN: ACEI cough.  2. COPD: Quit smoking 2014.  3. AAA: CT 1/13 with 3.0 cm AAA.  Abdominal US (1/14) with 3.4 cm AAA. Abdominal US (4/15) with 3.25 x 3.27 AAA.  4. Squamous cell lung cancer diagnosed 2/12.  Had right lower lobectomy in  4/12.  5. Hyperlipidemia 6. PAD: Left fem-pop bypass 1994.  ABIs (2/12) 0.62 on right, 0.95 on left. ABIs (1/14): 0.65 on right, 1.04 on left. ABIs (4/15) 0.66 right, 0.87 left.  7. Stress myoview 2004 was normal. Lexiscan Cardiolite (3/14) with EF 66%, no ischemia or infarction.  8. Echo (3/14): technically difficult with EF 55-60%, upper normal RV size.  9. Carotid stenosis: TIA 10/14.  Carotid dopplers with > 10% RICA, 25-85% LICA.  Patient had right CEA in 10/14. Carotids (4/15) patent right CEA, LICA 27-78% stenosis.  10. Atrial fibrillation: Paroxysmal. First noted after lobectomy in 4/12 (brief), recurrence in 4/15 then in 5/15.   SH: Married, lives in [place].  [place].  Quit smoking in 2014.   FH: No premature CAD  ROS: All systems reviewed and negative except as per HPI.   Current Outpatient Prescriptions  Medication Sig Dispense Refill  . bimatoprost (LUMIGAN) 0.03 % ophthalmic solution Place 1 drop into both eyes at bedtime.      . brimonidine (ALPHAGAN P) 0.1 % SOLN Place 3 drops into the left eye 3 (three) times daily.       [name] CARDURA 4 MG tablet TAKE (1/2) TABLET DAILY.  45 tablet  1  . cilostazol (PLETAL) 100 MG  tablet TAKE 1 TABLET ONCE DAILY.  90 tablet  3  . diltiazem (CARDIZEM CD) 120 MG 24 hr capsule TAKE 1 CAPSULE DAILY.  30 capsule  0  . dorzolamide-timolol (COSOPT) 22.3-6.8 MG/ML ophthalmic solution Place 2 drops into the right eye 2 (two) times daily.      [name] dronedarone (MULTAQ) 400 MG tablet Take 1 tablet (400 mg total) by mouth 2 (two) times daily with a meal.  60 tablet  6  . ELIQUIS 5 MG TABS tablet TAKE 1 TABLET TWICE DAILY.  60 tablet  0  . FLUoxetine (PROZAC) 20 MG tablet Take 1 tablet (20 mg total) by mouth daily.  30 tablet  2  . MELATONIN PO Take 1 tablet by mouth at bedtime as needed (sleep).       . Multiple Vitamins-Minerals (CENTRUM SILVER PO) Take 1 tablet by mouth daily.      . naproxen sodium (ANAPROX) 220 MG tablet Take 440  mg by mouth daily as needed (pain).      . ramipril (ALTACE) 2.5 MG capsule TAKE (1) CAPSULE DAILY.  30 capsule  5  . rosuvastatin (CRESTOR) 40 MG tablet Take 1 tablet (40 mg total) by mouth daily.  90 tablet  1   No current facility-administered medications for this visit.    BP 130/74  Pulse 56  Ht 6\' 1"  (1.854 m)  Wt 202 lb (91.627 kg)  BMI 26.66 kg/m2 General: NAD Neck: JVP 7 cm, no thyromegaly or thyroid nodule.  Lungs: Mildly decreased breath sounds right base.  CV: Nondisplaced PMI.  Heart irregular S1/S2, no S3/S4, 1/6 SEM RUSB.  Trace ankle edema on left.  No carotid bruit. Right foot is warm but unable to palpate PT pulse.   Abdomen: Soft, nontender, no hepatosplenomegaly, no distention.   Neurologic: Alert and oriented x 3.  Psych: Normal affect. Extremities: No clubbing or cyanosis.   Assessment/Plan: 1. Chest pain: He has had no further chest pain.  Lexiscan Cardiolite was negative in 3/14 and EF was preserved on echo.   2. Hyperlipidemia: Good lipids in 4/15.  3. Carotid stenosis: s/p R CEA.  Will follow with [facility] for residual LICA stenosis.  4. PAD: Stable claudication, recent ABIs worse on the left.  No foot ulcerations or rest pain.  Followed at [facility]. He is on cilostazol.  5. AAA: Small and stable on recent US.    6. COPD: Dyspnea improved with Spiriva and now that he has stopped smoking.   7. Atrial fibrillation: Patient remains in NSR on dronedarone.  CHADSVASC 3 (age, vascular disease, HTN).  He is also on diltiazem CD 120 mg daily.   8. CKD: Creatinine improved when checked in 6/15.      [name] 04/24/2014

## 2014-04-26 ENCOUNTER — Other Ambulatory Visit: Payer: Self-pay | Admitting: Cardiology

## 2014-06-01 DIAGNOSIS — M4800 Spinal stenosis, site unspecified: Secondary | ICD-10-CM

## 2014-06-01 HISTORY — DX: Spinal stenosis, site unspecified: M48.00

## 2014-06-14 ENCOUNTER — Other Ambulatory Visit: Payer: Self-pay | Admitting: Internal Medicine

## 2014-06-18 ENCOUNTER — Ambulatory Visit: Payer: BC Managed Care – PPO | Admitting: Cardiology

## 2014-06-21 ENCOUNTER — Other Ambulatory Visit: Payer: Self-pay | Admitting: Internal Medicine

## 2014-07-01 ENCOUNTER — Other Ambulatory Visit: Payer: Self-pay | Admitting: Dermatology

## 2014-07-05 ENCOUNTER — Other Ambulatory Visit: Payer: Self-pay | Admitting: Cardiology

## 2014-07-07 ENCOUNTER — Encounter: Payer: Self-pay | Admitting: Internal Medicine

## 2014-07-19 ENCOUNTER — Other Ambulatory Visit: Payer: Self-pay | Admitting: Cardiology

## 2014-07-26 ENCOUNTER — Other Ambulatory Visit: Payer: Self-pay | Admitting: Internal Medicine

## 2014-07-26 ENCOUNTER — Other Ambulatory Visit: Payer: Self-pay | Admitting: Cardiology

## 2014-07-26 NOTE — Telephone Encounter (Signed)
OK X 3 mos 

## 2014-07-27 ENCOUNTER — Encounter: Payer: Self-pay | Admitting: Family

## 2014-07-28 ENCOUNTER — Encounter: Payer: Self-pay | Admitting: Family

## 2014-07-28 ENCOUNTER — Ambulatory Visit (INDEPENDENT_AMBULATORY_CARE_PROVIDER_SITE_OTHER): Payer: BC Managed Care – PPO | Admitting: Family

## 2014-07-28 ENCOUNTER — Ambulatory Visit (INDEPENDENT_AMBULATORY_CARE_PROVIDER_SITE_OTHER)
Admission: RE | Admit: 2014-07-28 | Discharge: 2014-07-28 | Disposition: A | Discharge status: Home / Self Care | Payer: BC Managed Care – PPO | Source: Ambulatory Visit | Attending: Family | Admitting: Family

## 2014-07-28 ENCOUNTER — Ambulatory Visit (HOSPITAL_COMMUNITY)
Admission: RE | Admit: 2014-07-28 | Discharge: 2014-07-28 | Disposition: A | Discharge status: Home / Self Care | Payer: BC Managed Care – PPO | Source: Ambulatory Visit | Attending: Family | Admitting: Family

## 2014-07-28 VITALS — BP 147/75 | HR 56 | Resp 14 | Ht 73.0 in | Wt 204.0 lb

## 2014-07-28 DIAGNOSIS — M79661 Pain in right lower leg: Secondary | ICD-10-CM

## 2014-07-28 DIAGNOSIS — I739 Peripheral vascular disease, unspecified: Secondary | ICD-10-CM

## 2014-07-28 DIAGNOSIS — I6529 Occlusion and stenosis of unspecified carotid artery: Secondary | ICD-10-CM

## 2014-07-28 DIAGNOSIS — I6523 Occlusion and stenosis of bilateral carotid arteries: Secondary | ICD-10-CM

## 2014-07-28 DIAGNOSIS — I714 Abdominal aortic aneurysm, without rupture, unspecified: Secondary | ICD-10-CM

## 2014-07-28 DIAGNOSIS — Z48812 Encounter for surgical aftercare following surgery on the circulatory system: Secondary | ICD-10-CM

## 2014-07-28 NOTE — Patient Instructions (Addendum)
Stroke Prevention Some medical conditions and behaviors are associated with an increased chance of having a stroke. You may prevent a stroke by making healthy choices and managing medical conditions. HOW CAN I REDUCE MY RISK OF HAVING A STROKE?   Stay physically active. Get at least 30 minutes of activity on most or all days.  Do not smoke. It may also be helpful to avoid exposure to secondhand smoke.  Limit alcohol use. Moderate alcohol use is considered to be:  No more than 2 drinks per day for men.  No more than 1 drink per day for nonpregnant women.  Eat healthy foods. This involves:  Eating 5 or more servings of fruits and vegetables a day.  Making dietary changes that address high blood pressure (hypertension), high cholesterol, diabetes, or obesity.  Manage your cholesterol levels.  Making food choices that are high in fiber and low in saturated fat, trans fat, and cholesterol may control cholesterol levels.  Take any prescribed medicines to control cholesterol as directed by your health care provider.  Manage your diabetes.  Controlling your carbohydrate and sugar intake is recommended to manage diabetes.  Take any prescribed medicines to control diabetes as directed by your health care provider.  Control your hypertension.  Making food choices that are low in salt (sodium), saturated fat, trans fat, and cholesterol is recommended to manage hypertension.  Take any prescribed medicines to control hypertension as directed by your health care provider.  Maintain a healthy weight.  Reducing calorie intake and making food choices that are low in sodium, saturated fat, trans fat, and cholesterol are recommended to manage weight.  Stop drug abuse.  Avoid taking birth control pills.  Talk to your health care provider about the risks of taking birth control pills if you are over 35 years old, smoke, get migraines, or have ever had a blood clot.  Get evaluated for sleep  disorders (sleep apnea).  Talk to your health care provider about getting a sleep evaluation if you snore a lot or have excessive sleepiness.  Take medicines only as directed by your health care provider.  For some people, aspirin or blood thinners (anticoagulants) are helpful in reducing the risk of forming abnormal blood clots that can lead to stroke. If you have the irregular heart rhythm of atrial fibrillation, you should be on a blood thinner unless there is a good reason you cannot take them.  Understand all your medicine instructions.  Make sure that other conditions (such as anemia or atherosclerosis) are addressed. SEEK IMMEDIATE MEDICAL CARE IF:   You have sudden weakness or numbness of the face, arm, or leg, especially on one side of the body.  Your face or eyelid droops to one side.  You have sudden confusion.  You have trouble speaking (aphasia) or understanding.  You have sudden trouble seeing in one or both eyes.  You have sudden trouble walking.  You have dizziness.  You have a loss of balance or coordination.  You have a sudden, severe headache with no known cause.  You have new chest pain or an irregular heartbeat. Any of these symptoms may represent a serious problem that is an emergency. Do not wait to see if the symptoms will go away. Get medical help at once. Call your local emergency services (911 in [place] Do not drive yourself to the hospital. Document Released: 10/25/2004 Document Revised: 02/01/2014 Document Reviewed: 03/20/2013 [employer] Patient Information 2015 [employer]. This information is not intended to replace advice given   to you by your health care provider. Make sure you discuss any questions you have with your health care provider.   Abdominal Aortic Aneurysm An aneurysm is a weakened or damaged part of an artery wall that bulges from the normal force of blood pumping through the body. An abdominal aortic aneurysm is an aneurysm that  occurs in the lower part of the aorta, the main artery of the body.  The major concern with an abdominal aortic aneurysm is that it can enlarge and burst (rupture) or blood can flow between the layers of the wall of the aorta through a tear (aorticdissection). Both of these conditions can cause bleeding inside the body and can be life threatening unless diagnosed and treated promptly. CAUSES  The exact cause of an abdominal aortic aneurysm is unknown. Some contributing factors are:   A hardening of the arteries caused by the buildup of fat and other substances in the lining of a blood vessel (arteriosclerosis).  Inflammation of the walls of an artery (arteritis).   Connective tissue diseases, such as Marfan syndrome.   Abdominal trauma.   An infection, such as syphilis or staphylococcus, in the wall of the aorta (infectious aortitis) caused by bacteria. RISK FACTORS  Risk factors that contribute to an abdominal aortic aneurysm may include:  Age older than 58 years.   High blood pressure (hypertension).  Male gender.  Ethnicity (white race).  Obesity.  Family history of aneurysm (first degree relatives only).  Tobacco use. PREVENTION  The following healthy lifestyle habits may help decrease your risk of abdominal aortic aneurysm:  Quitting smoking. Smoking can raise your blood pressure and cause arteriosclerosis.  Limiting or avoiding alcohol.  Keeping your blood pressure, blood sugar level, and cholesterol levels within normal limits.  Decreasing your salt intake. In somepeople, too much salt can raise blood pressure and increase your risk of abdominal aortic aneurysm.  Eating a diet low in saturated fats and cholesterol.  Increasing your fiber intake by including whole grains, vegetables, and fruits in your diet. Eating these foods may help lower blood pressure.  Maintaining a healthy weight.  Staying physically active and exercising regularly. SYMPTOMS  The  symptoms of abdominal aortic aneurysm may vary depending on the size and rate of growth of the aneurysm.Most grow slowly and do not have any symptoms. When symptoms do occur, they may include:  Pain (abdomen, side, lower back, or groin). The pain may vary in intensity. A sudden onset of severe pain may indicate that the aneurysm has ruptured.  Feeling full after eating only small amounts of food.  Nausea or vomiting or both.  Feeling a pulsating lump in the abdomen.  Feeling faint or passing out. DIAGNOSIS  Since most unruptured abdominal aortic aneurysms have no symptoms, they are often discovered during diagnostic exams for other conditions. An aneurysm may be found during the following procedures:  Ultrasonography (A one-time screening for abdominal aortic aneurysm by ultrasonography is also recommended for all men aged 5-75 years who have ever smoked).  X-ray exams.  A computed tomography (CT).  Magnetic resonance imaging (MRI).  Angiography or arteriography. TREATMENT  Treatment of an abdominal aortic aneurysm depends on the size of your aneurysm, your age, and risk factors for rupture. Medication to control blood pressure and pain may be used to manage aneurysms smaller than 6 cm. Regular monitoring for enlargement may be recommended by your caregiver if:  The aneurysm is 3-4 cm in size (an annual ultrasonography may be recommended).  The  aneurysm is 4-4.5 cm in size (an ultrasonography every 6 months may be recommended).  The aneurysm is larger than 4.5 cm in size (your caregiver may ask that you be examined by a vascular surgeon). If your aneurysm is larger than 6 cm, surgical repair may be recommended. There are two main methods for repair of an aneurysm:   Endovascular repair (a minimally invasive surgery). This is done most often.  Open repair. This method is used if an endovascular repair is not possible. Document Released: 06/27/2005 Document Revised: 01/12/2013  Document Reviewed: 10/17/2012 [facility] Patient Information 2015 [place], [place]. This information is not intended to replace advice given to you by your health care provider. Make sure you discuss any questions you have with your health care provider.   Peripheral Vascular Disease Peripheral Vascular Disease (PVD), also called Peripheral Arterial Disease (PAD), is a circulation problem caused by cholesterol (atherosclerotic plaque) deposits in the arteries. PVD commonly occurs in the lower extremities (legs) but it can occur in other areas of the body, such as your arms. The cholesterol buildup in the arteries reduces blood flow which can cause pain and other serious problems. The presence of PVD can place a person at risk for Coronary Artery Disease (CAD).  CAUSES  Causes of PVD can be many. It is usually associated with more than one risk factor such as:   High Cholesterol.  Smoking.  Diabetes.  Lack of exercise or inactivity.  High blood pressure (hypertension).  Obesity.  Family history. SYMPTOMS   When the lower extremities are affected, patients with PVD may experience:  Leg pain with exertion or physical activity. This is called INTERMITTENT CLAUDICATION. This may present as cramping or numbness with physical activity. The location of the pain is associated with the level of blockage. For example, blockage at the abdominal level (distal abdominal aorta) may result in buttock or hip pain. Lower leg arterial blockage may result in calf pain.  As PVD becomes more severe, pain can develop with less physical activity.  In people with severe PVD, leg pain may occur at rest.  Other PVD signs and symptoms:  Leg numbness or weakness.  Coldness in the affected leg or foot, especially when compared to the other leg.  A change in leg color.  Patients with significant PVD are more prone to ulcers or sores on toes, feet or legs. These may take longer to heal or may reoccur. The  ulcers or sores can become infected.  If signs and symptoms of PVD are ignored, gangrene may occur. This can result in the loss of toes or loss of an entire limb.  Not all leg pain is related to PVD. Other medical conditions can cause leg pain such as:  Blood clots (embolism) or Deep Vein Thrombosis.  Inflammation of the blood vessels (vasculitis).  Spinal stenosis. DIAGNOSIS  Diagnosis of PVD can involve several different types of tests. These can include:  Pulse Volume Recording Method (PVR). This test is simple, painless and does not involve the use of X-rays. PVR involves measuring and comparing the blood pressure in the arms and legs. An ABI (Ankle-Brachial Index) is calculated. The normal ratio of blood pressures is 1. As this number becomes smaller, it indicates more severe disease.  < 0.95 - indicates significant narrowing in one or more leg vessels.  <0.8 - there will usually be pain in the foot, leg or buttock with exercise.  <0.4 - will usually have pain in the legs at rest.  <0.25 -  usually indicates limb threatening PVD.  Doppler detection of pulses in the legs. This test is painless and checks to see if you have a pulses in your legs/feet.  A dye or contrast material (a substance that highlights the blood vessels so they show up on x-ray) may be given to help your caregiver better see the arteries for the following tests. The dye is eliminated from your body by the kidney's. Your caregiver may order blood work to check your kidney function and other laboratory values before the following tests are performed:  Magnetic Resonance Angiography (MRA). An MRA is a picture study of the blood vessels and arteries. The MRA machine uses a large magnet to produce images of the blood vessels.  Computed Tomography Angiography (CTA). A CTA is a specialized x-ray that looks at how the blood flows in your blood vessels. An IV may be inserted into your arm so contrast dye can be  injected.  Angiogram. Is a procedure that uses x-rays to look at your blood vessels. This procedure is minimally invasive, meaning a small incision (cut) is made in your groin. A small tube (catheter) is then inserted into the artery of your groin. The catheter is guided to the blood vessel or artery your caregiver wants to examine. Contrast dye is injected into the catheter. X-rays are then taken of the blood vessel or artery. After the images are obtained, the catheter is taken out. TREATMENT  Treatment of PVD involves many interventions which may include:  Lifestyle changes:  Quitting smoking.  Exercise.  Following a low fat, low cholesterol diet.  Control of diabetes.  Foot care is very important to the PVD patient. Good foot care can help prevent infection.  Medication:  Cholesterol-lowering medicine.  Blood pressure medicine.  Anti-platelet drugs.  Certain medicines may reduce symptoms of Intermittent Claudication.  Interventional/Surgical options:  Angioplasty. An Angioplasty is a procedure that inflates a balloon in the blocked artery. This opens the blocked artery to improve blood flow.  Stent Implant. A wire mesh tube (stent) is placed in the artery. The stent expands and stays in place, allowing the artery to remain open.  Peripheral Bypass Surgery. This is a surgical procedure that reroutes the blood around a blocked artery to help improve blood flow. This type of procedure may be performed if Angioplasty or stent implants are not an option. SEEK IMMEDIATE MEDICAL CARE IF:   You develop pain or numbness in your arms or legs.  Your arm or leg turns cold, becomes blue in color.  You develop redness, warmth, swelling and pain in your arms or legs. MAKE SURE YOU:   Understand these instructions.  Will watch your condition.  Will get help right away if you are not doing well or get worse. Document Released: 10/25/2004 Document Revised: 12/10/2011 Document  Reviewed: 09/21/2008 [facility] Patient Information 2015 [place], [place]. This information is not intended to replace advice given to you by your health care provider. Make sure you discuss any questions you have with your health care provider.

## 2014-07-28 NOTE — Progress Notes (Signed)
[facility] [place] HISTORY AND PHYSICAL   MRN : [id number]  History of Present Illness:   Troy [name] is a 73 y.o. male patient who has been followed for many years by Dr. [name] for peripheral arterial disease, a small abdominal aortic aneurysm, and is s/p left CEA on 07/14/13 by Dr. [name].   He returns today for follow up for ABI's and carotid Duplex.   Medical problems include hypertension hyperlipidemia glaucoma abdominal aortic aneurysm (3.4 cm in January 2014) and remote history of lung cancer all of which are currently.  After about 1/2 mile he develops bilateral buttocks and calf pain, worse in the right, denies non healing wounds.  Dr. Susa Day, ortho, determined that he has spinal stenosis.  He denies any stroke activity since his TIA before the CEA as manifested by left arm tremor and weakness, both legs giving way, and seeing purple spots in both eyes, he has no residual neurological deficits.   He denies back or abdominal pain.   Pt Diabetic: No  Pt smoker: former smoker, quit October, 2014  Pt meds include: Statin :Yes Betablocker: Yes ASA: No Other anticoagulants/antiplatelets: Eliquis for atrial fib prescribed by [name], Plavix stopped He also takes Pletal  Current Outpatient Prescriptions  Medication Sig Dispense Refill  . bimatoprost (LUMIGAN) 0.03 % ophthalmic solution Place 1 drop into both eyes at bedtime.      . brimonidine (ALPHAGAN P) 0.1 % SOLN Place 3 drops into the left eye 3 (three) times daily.       [name] CARDURA 4 MG tablet TAKE (1/2) TABLET DAILY.  45 tablet  1  . cilostazol (PLETAL) 100 MG tablet TAKE 1 TABLET ONCE DAILY.  90 tablet  3  . CRESTOR 40 MG tablet TAKE 1 TABLET ONCE DAILY.  90 tablet  0  . diltiazem (CARDIZEM CD) 120 MG 24 hr capsule TAKE 1 CAPSULE DAILY.  30 capsule  2  . dorzolamide-timolol (COSOPT) 22.3-6.8 MG/ML ophthalmic solution Place 2 drops into the right eye 2 (two) times daily.      [name]  dronedarone (MULTAQ) 400 MG tablet Take 1 tablet (400 mg total) by mouth 2 (two) times daily with a meal.  60 tablet  6  . ELIQUIS 5 MG TABS tablet TAKE 1 TABLET TWICE DAILY.  60 tablet  3  . FLUoxetine (PROZAC) 20 MG capsule TAKE (1) CAPSULE DAILY.  30 capsule  2  . MELATONIN PO Take 1 tablet by mouth at bedtime as needed (sleep).       . Multiple Vitamins-Minerals (CENTRUM SILVER PO) Take 1 tablet by mouth daily.      . naproxen sodium (ANAPROX) 220 MG tablet Take 440 mg by mouth daily as needed (pain).      . ramipril (ALTACE) 2.5 MG capsule TAKE (1) CAPSULE DAILY.  30 capsule  5  . FLUoxetine (PROZAC) 20 MG tablet Take 1 tablet (20 mg total) by mouth daily.  30 tablet  2   No current facility-administered medications for this visit.    Past Medical History  Diagnosis Date  . Hypertension   . Hyperlipidemia   . Glaucoma BOTH EYES    Dr Gershon Crane  . Hx of adenomatous colonic polyps 2005     X 2; 1 hyperplastic polyp; Dr Olevia Perches  . Impaired fasting glucose 2007    108; A1c5.4%  . History of lung cancer APRIL 2012  SQUAMOUS CELL---- S/P RIGHT LOWER LOBECTOMY AT [facility] --  NO CHEMORADIATION---  NO RECURRENCE     ONCOLOGIST- DR Tressie Stalker  LOV [facility] 10-27-2012  . GERD (gastroesophageal reflux disease)   . BPH (benign prostatic hypertrophy)   . Frequency of urination   . Urgency of urination   . Nocturia   . Lesion of bladder   . Microhematuria   . Arthritis   . History of basal cell carcinoma excision   . Exertional dyspnea   . Peripheral vascular disease S/P ANGIOPLASTY AND STENTING    FOLLOWED  BY DR [name]  . PAD (peripheral artery disease) ABI'S  JAN 2014  0.65 ON RIGHT ;  1.04 ON LEFT  . AAA (abdominal aortic aneurysm) LAST ABD. [name]  JAN 2014  3.4CM    MONITORED BY DR [name]  . Bilateral carotid artery stenosis DUPLEX 12-29-2012  BY DR [facility]    BILATERAL ICA STENOSIS 60-79%  . COPD (chronic obstructive pulmonary disease)   . Stroke Jul 07, 2013    mini  TIA  . Cancer  01-25-12    LUNG  . Atrial fibrillation   . Spinal stenosis Sept. 2015    Social History History  Substance Use Topics  . Smoking status: Former Smoker -- 0.50 packs/day for 50 years    Types: Cigarettes    Quit date: 07/28/2013  . Smokeless tobacco: Never Used     Comment: pt states that he is now taking chantix  . Alcohol Use: Yes     Comment:  socially, variable    Family History Family History  Problem Relation Age of Onset  . Stroke Mother     mini strokes  . Alcohol abuse Father   . Heart disease Father     MI after 58  . Stroke Father   . Hypertension Father   . Heart attack Father   . Heart disease Paternal Aunt     several  . Hypertension Paternal Aunt     several  . Stroke Paternal Aunt     several  . Stroke Paternal Uncle     several  . Heart disease Paternal Uncle     several;2 had MI pre 69    Surgical History Past Surgical History  Procedure Laterality Date  . Angio plasty      X 4 in legs  . Colon polyectomy    . Trabecular surgery      OS  . Lung lobectomy  01/24/2011     RIGHT UPPER LOBE  (SQUAMOUS CELL CARCINOMA) Dr Dorthea Cove , [facility]. No chemotherapyor radiation  . Laryngoscopy  06-27-2004    BX VOCAL CORD  (LEUKOPLAKIA)  PER PT NO ISSUES SINCE  . Cataract extraction w/ intraocular lens  implant, bilateral    . Basal cell carcinoma excision      MULTIPLE TIMES--  RIGHT FOREARM, CHEEKS, AND BACK  . Femoral-popliteal bypass graft Left 1994  [facility]    AND 2001  IN [place]  . Cardiovascular stress test  12-08-2012  DR [facility]    NORMAL LEXISCAN WITH NO EXERCISE NUCLEAR STUDY/ EF 66%/   NO ISCHEMIA/ NO SIGNIFICANT CHANGE FROM PRIOR STUDY  . Transthoracic echocardiogram  12-29-2012  DR [facility]    MILD LVH/  LVSF NORMAL/ EF 01-77%/  GRADE I DIASTOLIC DYSFUNCTION  . Aortogram  07-27-2002    MILD DIFFUSE ILIAC ARTERY OCCLUSIVE DISEASE /  LEFT RENAL ARTERY 20%/ PATENT LEFT FEM-POP GRAFT/ MILD SFA AND POPLITEAL ARTERY OCCLUSIVE DISEASE W/ SEVERE  KIDNEY OCCLUSIVE DISEASE  . Cystoscopy w/ retrogrades Bilateral 01/21/2013  Procedure: CYSTOSCOPY WITH RETROGRADE PYELOGRAM;  Surgeon: Molli Hazard, MD;  Location: [facility];  Service: Urology;  Laterality: Bilateral;   CYSTO, BLADDER BIOPSY, BILATERAL RETROGRADE PYELOGRAM  RAD TECH FROM RADIOLOGY PER JOY  . Cystoscopy with biopsy N/A 01/21/2013    Procedure: CYSTOSCOPY WITH BIOPSY;  Surgeon: Molli Hazard, MD;  Location: [facility];  Service: Urology;  Laterality: N/A;  . Endarterectomy Right 07/14/2013    Procedure: ENDARTERECTOMY CAROTID;  Surgeon: Angelia Mould, MD;  Location: [place];  Service: Vascular;  Laterality: Right;  . Patch angioplasty Right 07/14/2013    Procedure: PATCH ANGIOPLASTY;  Surgeon: Angelia Mould, MD;  Location: [facility] OR;  Service: Vascular;  Laterality: Right;  . Carotid endarterectomy Right 07-14-13    cea    Allergies  Allergen Reactions  . Penicillins Shortness Of Breath and Hives    Flushing  & dyspnea Because of a history of documented adverse serious drug reaction;Medi Alert bracelet  is recommended  . Sulfonamide Derivatives Shortness Of Breath    Flushing & dyspnea Because of a history of documented adverse serious drug reaction;Medi Alert bracelet  is recommended  . Niacin-Lovastatin Er Other (See Comments)    Dyspnea, flushing  . Sulfa Antibiotics Hives  . Atorvastatin Other (See Comments)    Myalgias & athralgias    Current Outpatient Prescriptions  Medication Sig Dispense Refill  . bimatoprost (LUMIGAN) 0.03 % ophthalmic solution Place 1 drop into both eyes at bedtime.      . brimonidine (ALPHAGAN P) 0.1 % SOLN Place 3 drops into the left eye 3 (three) times daily.       [name] CARDURA 4 MG tablet TAKE (1/2) TABLET DAILY.  45 tablet  1  . cilostazol (PLETAL) 100 MG tablet TAKE 1 TABLET ONCE DAILY.  90 tablet  3  . CRESTOR 40 MG tablet TAKE 1 TABLET ONCE DAILY.  90 tablet  0  . diltiazem  (CARDIZEM CD) 120 MG 24 hr capsule TAKE 1 CAPSULE DAILY.  30 capsule  2  . dorzolamide-timolol (COSOPT) 22.3-6.8 MG/ML ophthalmic solution Place 2 drops into the right eye 2 (two) times daily.      [name] dronedarone (MULTAQ) 400 MG tablet Take 1 tablet (400 mg total) by mouth 2 (two) times daily with a meal.  60 tablet  6  . ELIQUIS 5 MG TABS tablet TAKE 1 TABLET TWICE DAILY.  60 tablet  3  . FLUoxetine (PROZAC) 20 MG capsule TAKE (1) CAPSULE DAILY.  30 capsule  2  . MELATONIN PO Take 1 tablet by mouth at bedtime as needed (sleep).       . Multiple Vitamins-Minerals (CENTRUM SILVER PO) Take 1 tablet by mouth daily.      . naproxen sodium (ANAPROX) 220 MG tablet Take 440 mg by mouth daily as needed (pain).      . ramipril (ALTACE) 2.5 MG capsule TAKE (1) CAPSULE DAILY.  30 capsule  5  . FLUoxetine (PROZAC) 20 MG tablet Take 1 tablet (20 mg total) by mouth daily.  30 tablet  2   No current facility-administered medications for this visit.     REVIEW OF SYSTEMS: See HPI for pertinent positives and negatives.  Physical Examination Filed Vitals:   07/28/14 1129 07/28/14 1132  BP: 149/72 147/75  Pulse: 56 56  Resp:  14  Height:  6\' 1"  (1.854 m)  Weight:  204 lb (92.534 kg)  SpO2:  98%   Body mass index is 26.92  kg/(m^2).  General: WDWN in NAD  Gait: Normal  HENT: WNL  Eyes: Pupils equal  Pulmonary: normal non-labored breathing , without Rales, rhonchi, wheezing  Cardiac: RRR, no murmur detected  Abdomen: soft, NT, no masses palpated. Skin: no rashes, ulcers noted; no Gangrene , no cellulitis; no open wounds.  VASCULAR EXAM  Carotid Bruits  Left  Right    Negative  Negative    VASCULAR EXAM:  Extremities without ischemic changes  without Gangrene; without open wounds; without drainage.   LE Pulses  LEFT  RIGHT   FEMORAL  palpable  palpable   POPLITEAL  not palpable  not palpable   POSTERIOR TIBIAL  not palpable  not palpable   DORSALIS PEDIS  ANTERIOR TIBIAL  not palpable  not  palpable    Musculoskeletal: no muscle wasting or atrophy; no peripheral edema.  Neurologic: A&O X 3; Appropriate Affect ;  SENSATION: normal;  MOTOR FUNCTION: 5/5 Symmetric, CN 2-12 intact except for uvula deviation slightly to the left  Speech is fluent/normal   Non-Invasive Vascular Imaging (07/28/2014):  CEREBROVASCULAR DUPLEX EVALUATION    INDICATION: Follow-up carotid disease     PREVIOUS INTERVENTION(S): Right carotid endarterectomy 07/14/2013    DUPLEX EXAM:     RIGHT  LEFT  Peak Systolic Velocities (cm/s) End Diastolic Velocities (cm/s) Plaque LOCATION Peak Systolic Velocities (cm/s) End Diastolic Velocities (cm/s) Plaque  131 20  CCA PROXIMAL 117 17   95 18  CCA MID 114 22   77 16  CCA DISTAL 91 19   225 5  ECA 154 12   86 23  ICA PROXIMAL 161 38 HT/CP  98 25  ICA MID 121 30 HT/CP  110 33  ICA DISTAL 75 23     NA ICA / CCA Ratio (PSV) 1.8  Antegrade  Vertebral Flow Antegrade   782 Brachial Systolic Pressure (mmHg) 956  Within normal limits  Brachial Artery Waveforms Within normal limits     Plaque Morphology:  HM = Homogeneous, HT = Heterogeneous, CP = Calcific Plaque, SP = Smooth Plaque, IP = Irregular Plaque  ADDITIONAL FINDINGS:     IMPRESSION: 1. Widely patent right carotid endarterectomy without evidence of restenosis or hyperplasia. 2. Evidence of <40% stenosis of the left internal carotid artery. 3. Bilateral vertebral artery is antegrade.     Compared to the previous exam:  Left internal carotid artery category has decreased compared to previous exam.    ABI (Date: 07/28/2014)  R: 0.64 (01/20/14, 0.66), waveforms:  monophasic, TBI: 0.75  L: 0.97 (0.87), waveforms: triphasic, TBI: 1.00    ASSESSMENT:  Troy [name] is a 73 y.o. male who has been followed for many years by Dr. [name] for peripheral arterial disease, a small abdominal aortic aneurysm, and is s/p left CEA on 07/14/13 by Dr. [name].   He returns today for follow up for ABI's and  carotid Duplex.  He denies any stroke activity since his TIA before the CEA. Today's carotid Duplex reveals a widely patent right carotid endarterectomy without evidence of restenosis or hyperplasia, and evidence of <40% stenosis of the left internal carotid artery. Left internal carotid artery stenosis has decreased compared to previous exam.  He has mild claudication symptoms, worse in the right LE. ABI's indicate evidence of right LE arterial occlusive disease, stable from six months prior, and normal left lower extremity; normal bilateral TBI's.  Pt requests this progress note also go to Dr. Tonita Cong.  PLAN:   Based on today's exam and non-invasive  vascular lab results, the patient will follow up in 1 year with the following tests: carotid Duplex; ABI's and  AAA Duplex to include iliac arteries in 6 months. I discussed in depth with the patient the nature of atherosclerosis, and emphasized the importance of maximal medical management including strict control of blood pressure, blood glucose, and lipid levels, obtaining regular exercise, and cessation of smoking.  The patient is aware that without maximal medical management the underlying atherosclerotic disease process will progress, limiting the benefit of any interventions. Consideration for repair of AAA would be made when the size approaches 4.8 or 5.0 cm, growth > 1 cm/yr, and symptomatic status. The patient was given information about stroke prevention and what symptoms should prompt the patient to seek immediate medical care. The patient was given information about AAA including signs, symptoms, treatment,  what symptoms should prompt the patient to seek immediate medical care, and how to minimize the risk of enlargement and rupture of aneurysms. The patient was given information about PAD including signs, symptoms, treatment, what symptoms should prompt the patient to seek immediate medical care, and risk reduction measures to take. Thank  you for allowing [name] to participate in this patient's care.  Clemon Chambers, RN, MSN, FNP-C [facility] Office: [phone]  Clinic MD: [name] 07/28/2014 11:34 AM

## 2014-07-30 ENCOUNTER — Encounter: Payer: Self-pay | Admitting: Internal Medicine

## 2014-07-30 ENCOUNTER — Ambulatory Visit (INDEPENDENT_AMBULATORY_CARE_PROVIDER_SITE_OTHER): Payer: BC Managed Care – PPO | Admitting: Internal Medicine

## 2014-07-30 ENCOUNTER — Other Ambulatory Visit (INDEPENDENT_AMBULATORY_CARE_PROVIDER_SITE_OTHER): Payer: BC Managed Care – PPO

## 2014-07-30 VITALS — BP 128/62 | HR 67 | Temp 97.9°F | Wt 206.0 lb

## 2014-07-30 DIAGNOSIS — R413 Other amnesia: Secondary | ICD-10-CM

## 2014-07-30 DIAGNOSIS — I6529 Occlusion and stenosis of unspecified carotid artery: Secondary | ICD-10-CM

## 2014-07-30 DIAGNOSIS — Z23 Encounter for immunization: Secondary | ICD-10-CM

## 2014-07-30 LAB — TSH: TSH: 1.52 u[IU]/mL (ref 0.35–4.50)

## 2014-07-30 LAB — VITAMIN B12: VITAMIN B 12: 549 pg/mL (ref 211–911)

## 2014-07-30 NOTE — Patient Instructions (Signed)
   Please monitor for excessive snoring or frank apnea as we discussed.

## 2014-07-30 NOTE — Progress Notes (Signed)
Pre visit review using our clinic review tool, if applicable. No additional management support is needed unless otherwise documented below in the visit note. 

## 2014-07-30 NOTE — Progress Notes (Signed)
   Subjective:    Patient ID: Troy [name], male    DOB: [birth year], 73 y.o.   MRN: [id number]  HPI   He is here with his daughter to discuss memory issues.  His wife listed her concerns in writing in detail  & that will be entered into the chart. The entire family is concerned mainly about conversations are not remembered later .  Also his office staff have noted that he has memory issues. They describe inability to recall  phone numbers as 1 issue.  He denies significant depression or suicidal ideation      Review of Systems   There is no definite excessive snoring or sleep apnea. He does sleep for up to 10 hours a time. He attributes this to his medications particularly Multaq.  The family also states that he has lost interest in his appearance. He was always a meticulous dresser.     Objective:   Physical Exam  Pertinent or positive findings include: He is somewhat disheveled Slight ptosis present on left Loud right carotid bruit is present Cranial nerve exam is otherwise normal.  Mini Mental Status exam was scored as 29 out of 30. He missed the date saying that was the 29 th throughout instead of the 30th.  He named 10 animals in 30 seconds.  Clock drawing test was excellent.        Assessment & Plan:  #1subjective  memory issues, not documented  Plan see orders

## 2014-07-31 LAB — RPR

## 2014-08-03 ENCOUNTER — Other Ambulatory Visit: Payer: Self-pay | Admitting: Internal Medicine

## 2014-08-03 ENCOUNTER — Telehealth: Payer: Self-pay | Admitting: Internal Medicine

## 2014-08-03 DIAGNOSIS — R413 Other amnesia: Secondary | ICD-10-CM | POA: Insufficient documentation

## 2014-08-03 NOTE — Telephone Encounter (Signed)
Orders for non fasting labs entered into  the EMR

## 2014-08-03 NOTE — Telephone Encounter (Signed)
Pt wife called in and said that pt would like referral:     [facility]   Neurologist  Address: [address] [place], [place] [address]  [phone]  Fax number [phone]

## 2014-08-04 NOTE — Telephone Encounter (Signed)
Referral placed by Dr Linna Darner. Patient to await phone call from [facility]

## 2014-08-06 ENCOUNTER — Telehealth: Payer: Self-pay | Admitting: Internal Medicine

## 2014-08-06 NOTE — Telephone Encounter (Signed)
Patient's wife is requesting results of b12.  She is going to check mychart but would like a phone call.

## 2014-08-06 NOTE — Telephone Encounter (Signed)
Called pt wife inform her md release results to mychart system, but did give her md response...Troy [name]

## 2014-08-18 ENCOUNTER — Ambulatory Visit (INDEPENDENT_AMBULATORY_CARE_PROVIDER_SITE_OTHER): Payer: BC Managed Care – PPO | Admitting: Neurology

## 2014-08-18 ENCOUNTER — Encounter: Payer: Self-pay | Admitting: Neurology

## 2014-08-18 VITALS — BP 123/64 | HR 61 | Ht 73.0 in | Wt 203.0 lb

## 2014-08-18 DIAGNOSIS — I6529 Occlusion and stenosis of unspecified carotid artery: Secondary | ICD-10-CM

## 2014-08-18 DIAGNOSIS — R4189 Other symptoms and signs involving cognitive functions and awareness: Secondary | ICD-10-CM

## 2014-08-18 DIAGNOSIS — F919 Conduct disorder, unspecified: Secondary | ICD-10-CM

## 2014-08-18 DIAGNOSIS — R4689 Other symptoms and signs involving appearance and behavior: Principal | ICD-10-CM

## 2014-08-18 MED ORDER — DONEPEZIL HCL 5 MG PO TABS: 5.0000 mg | ORAL_TABLET | Freq: Every day | ORAL | Status: DC

## 2014-08-18 NOTE — Progress Notes (Signed)
[facility]    Provider:  Dr Jaynee Eagles Referring Provider: Hendricks Limes, MD Primary Care Physician:  [name], MD  CC:  Memory problems  HPI:  Troy [name] is a 73 y.o. male here as a referral from Dr. Linna Darner for cognitive changes. He is here with his wife today. He has noticed memory loss the last few years. Children say he repeats things, that he has trouble making or acting on decisions, he is losing focus and has loss of concentration. Wife is here and contributes. He forgets names of people he knew for years. No hallucinations or delusions. No significant etoh usage. He feels depressed. He is on prozac. 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 Decided it was time to step down. This was recently, last June. He still plays golf. He is more critical then he used to be, less patient. Irritable. He is having pains in his legs due to spinal stenosis and PVD that keeps him up at night but denies snoring or apneic events. His wife notices he is short with grandchildren, he will "cut them down". The wife and secretary get him to where he is supposed to be and ensure he does what he is supposed to do. He has a Network engineer that manages his bills. He had an MRI a year ago but since then he has noticed a significant decline in cognitive function. He gets up and some mornings he doesn't care whether he gets dressed or not.  Wife says he gets tired during the day, has fatigue and malaise. No FHx neurodegenerative dz.   Reviewed notes, labs and imaging from outside physicians, which showed: nml B12 and TSH. MRI in October 2014 revealed:   IMPRESSION: MRI HEAD IMPRESSION  Normal for age non contrast MRI appearance of the brain.  MRA HEAD IMPRESSION  1. Extensive ICA siphon calcified atherosclerosis. Still, no hemodynamically significant stenosis identified. Laterally directed atherosclerotic pseudo-lesion of the left  cavernous ICA suspected (rather than a small cavernous aneurysm).  2. Otherwise negative intracranial MRA.  Review of Systems: Patient complains of symptoms per HPI as well as the following symptoms fatigue, loss of vision, easy bruising, easy bleeding, memory loss, restless legs, depression, decreased energy, aching muscles, hearing loss, ringing in ears. Pertinent negatives per HPI. All others negative.   History   Social History  . Marital Status: Married    Spouse Name: [name]     Number of Children: 3  . Years of Education: 12+   Occupational History  . Not on file.   Social History Main Topics  . Smoking status: Former Smoker -- 0.50 packs/day for 50 years    Types: Cigarettes    Quit date: 07/28/2013  . Smokeless tobacco: Never Used     Comment: pt states that he is now taking chantix  . Alcohol Use: Yes     Comment:  socially, variable  . Drug Use: No  . Sexual Activity: Not on file   Other Topics Concern  . Not on file   Social History Narrative   Patient lives at home with spouse [name]   Patient has 3 children    Patient is right handed     Family History  Problem Relation Age of Onset  . Stroke Mother     mini strokes  . Alcohol abuse Father   . Heart disease Father     MI after 11  .  Stroke Father   . Hypertension Father   . Heart attack Father   . Heart disease Paternal Aunt     several  . Hypertension Paternal Aunt     several  . Stroke Paternal Aunt     several  . Stroke Paternal Uncle     several  . Heart disease Paternal Uncle     several;2 had MI pre 66    Past Medical History  Diagnosis Date  . Hypertension   . Hyperlipidemia   . Glaucoma BOTH EYES    Dr Gershon Crane  . Hx of adenomatous colonic polyps 2005     X 2; 1 hyperplastic polyp; Dr Olevia Perches  . Impaired fasting glucose 2007    108; A1c5.4%  . History of lung cancer APRIL 2012  SQUAMOUS CELL---- S/P RIGHT LOWER LOBECTOMY AT [facility] --  NO CHEMORADIATION---  NO RECURRENCE      ONCOLOGIST- DR Tressie Stalker  LOV IN [facility] 10-27-2012  . GERD (gastroesophageal reflux disease)   . BPH (benign prostatic hypertrophy)   . Frequency of urination   . Urgency of urination   . Nocturia   . Lesion of bladder   . Microhematuria   . Arthritis   . History of basal cell carcinoma excision   . Exertional dyspnea   . Peripheral vascular disease S/P ANGIOPLASTY AND STENTING    FOLLOWED  BY DR [name]  . PAD (peripheral artery disease) ABI'S  JAN 2014  0.65 ON RIGHT ;  1.04 ON LEFT  . AAA (abdominal aortic aneurysm) LAST ABD. [name]  JAN 2014  3.4CM    MONITORED BY DR [name]  . Bilateral carotid artery stenosis DUPLEX 12-29-2012  BY DR [facility]    BILATERAL ICA STENOSIS 60-79%  . COPD (chronic obstructive pulmonary disease)   . Stroke Jul 07, 2013    mini  TIA  . Cancer 01-25-12    LUNG  . Atrial fibrillation   . Spinal stenosis Sept. 2015    Past Surgical History  Procedure Laterality Date  . Angio plasty      X 4 in legs  . Colon polyectomy    . Trabecular surgery      OS  . Lung lobectomy  01/24/2011     RIGHT UPPER LOBE  (SQUAMOUS CELL CARCINOMA) Dr Dorthea Cove , [facility]. No chemotherapyor radiation  . Laryngoscopy  06-27-2004    BX VOCAL CORD  (LEUKOPLAKIA)  PER PT NO ISSUES SINCE  . Cataract extraction w/ intraocular lens  implant, bilateral    . Basal cell carcinoma excision      MULTIPLE TIMES--  RIGHT FOREARM, CHEEKS, AND BACK  . Femoral-popliteal bypass graft Left 1994  [facility]    AND 2001  IN [place]  . Cardiovascular stress test  12-08-2012  DR [facility]    NORMAL LEXISCAN WITH NO EXERCISE NUCLEAR STUDY/ EF 66%/   NO ISCHEMIA/ NO SIGNIFICANT CHANGE FROM PRIOR STUDY  . Transthoracic echocardiogram  12-29-2012  DR [facility]    MILD LVH/  LVSF NORMAL/ EF 75-64%/  GRADE I DIASTOLIC DYSFUNCTION  . Aortogram  07-27-2002    MILD DIFFUSE ILIAC ARTERY OCCLUSIVE DISEASE /  LEFT RENAL ARTERY 20%/ PATENT LEFT FEM-POP GRAFT/ MILD SFA AND POPLITEAL ARTERY OCCLUSIVE DISEASE W/ SEVERE  KIDNEY OCCLUSIVE DISEASE  . Cystoscopy w/ retrogrades Bilateral 01/21/2013    Procedure: CYSTOSCOPY WITH RETROGRADE PYELOGRAM;  Surgeon: Molli Hazard, MD;  Location: [facility];  Service: Urology;  Laterality: Bilateral;   CYSTO, BLADDER BIOPSY, BILATERAL  RETROGRADE PYELOGRAM  RAD TECH FROM RADIOLOGY PER JOY  . Cystoscopy with biopsy N/A 01/21/2013    Procedure: CYSTOSCOPY WITH BIOPSY;  Surgeon: Molli Hazard, MD;  Location: [facility];  Service: Urology;  Laterality: N/A;  . Endarterectomy Right 07/14/2013    Procedure: ENDARTERECTOMY CAROTID;  Surgeon: Angelia Mould, MD;  Location: [place];  Service: Vascular;  Laterality: Right;  . Patch angioplasty Right 07/14/2013    Procedure: PATCH ANGIOPLASTY;  Surgeon: Angelia Mould, MD;  Location: [place];  Service: Vascular;  Laterality: Right;  . Carotid endarterectomy Right 07-14-13    cea    Current Outpatient Prescriptions  Medication Sig Dispense Refill  . bimatoprost (LUMIGAN) 0.03 % ophthalmic solution Place 1 drop into both eyes at bedtime.    [name] BIOTIN PO Take 5,000 mcg by mouth daily.    . Bisacodyl (DULCOLAX PO) Take by mouth as needed.    . brimonidine (ALPHAGAN P) 0.1 % SOLN Place 3 drops into the left eye 3 (three) times daily.     [name] CARDURA 4 MG tablet TAKE (1/2) TABLET DAILY. 45 tablet 1  . cilostazol (PLETAL) 100 MG tablet TAKE 1 TABLET ONCE DAILY. 90 tablet 3  . CRESTOR 40 MG tablet TAKE 1 TABLET ONCE DAILY. 90 tablet 0  . diltiazem (CARDIZEM CD) 120 MG 24 hr capsule TAKE 1 CAPSULE DAILY. 30 capsule 1  . dorzolamide-timolol (COSOPT) 22.3-6.8 MG/ML ophthalmic solution Place 2 drops into the right eye 2 (two) times daily.    [name] dronedarone (MULTAQ) 400 MG tablet Take 1 tablet (400 mg total) by mouth 2 (two) times daily with a meal. 60 tablet 6  . ELIQUIS 5 MG TABS tablet TAKE 1 TABLET TWICE DAILY. 60 tablet 3  . FLUoxetine (PROZAC) 20 MG tablet Take 1 tablet (20 mg total)  by mouth daily. 30 tablet 2  . MELATONIN PO Take 1 tablet by mouth at bedtime as needed (sleep).     . Multiple Vitamins-Minerals (CENTRUM SILVER PO) Take 1 tablet by mouth daily.    . ramipril (ALTACE) 2.5 MG capsule TAKE (1) CAPSULE DAILY. 30 capsule 1  . ranitidine (ZANTAC) 150 MG tablet Take 150 mg by mouth as needed for heartburn.    . donepezil (ARICEPT) 5 MG tablet Take 1 tablet (5 mg total) by mouth at bedtime. 30 tablet 0   No current facility-administered medications for this visit.    Allergies as of 08/18/2014 - Review Complete 08/18/2014  Allergen Reaction Noted  . Penicillins Shortness Of Breath and Hives   . Sulfonamide derivatives Shortness Of Breath   . Niacin-lovastatin er Other (See Comments)   . Sulfa antibiotics Hives 03/11/2014  . Atorvastatin Other (See Comments)     Vitals: BP 123/64 mmHg  Pulse 61  Ht 6\' 1"  (1.854 m)  Wt 203 lb (92.08 kg)  BMI 26.79 kg/m2 Last Weight:  Wt Readings from Last 1 Encounters:  08/18/14 203 lb (92.08 kg)   Last Height:   Ht Readings from Last 1 Encounters:  08/18/14 6\' 1"  (1.854 m)   Physical exam: Exam: Gen: NAD, conversant, well nourised, obese, well groomed                     CV: RRR, no MRG. No Carotid Bruits. No peripheral edema, warm, nontender Eyes: Conjunctivae clear without exudates or hemorrhage  Neuro: Detailed Neurologic Exam  Speech:    Speech is normal; fluent and spontaneous with normal comprehension.  Cognition: MoCA  24/30 with -3 for visuospatial execution, -1 fluency, -2 delayed recall.     The patient is oriented to person, place, and time;     recent and remote memory intact;     language fluent;     normal attention, concentration,     fund of knowledge Cranial Nerves:    The pupils are equal, round, and reactive to light. The fundi are flat. Visual fields are full to finger confrontation. Extraocular movements are intact. Trigeminal sensation is intact and the muscles of mastication are  normal. The face is symmetric. The palate elevates in the midline. Voice is normal. Shoulder shrug is normal. The tongue has normal motion without fasciculations.   Coordination:    Normal finger to nose and heel to shin. Normal rapid alternating movements.   Gait:    Not shuffling, good arm swing  Motor Observation:    Mild intention tremor Tone:    Normal muscle tone.    Posture:    Posture is normal. normal erect    Strength:    Strength is V/V in the upper and lower limbs.      Sensation: intact to LT     Reflex Exam:  DTR's:    Deep tendon reflexes in the upper and lower extremities are normal bilaterally.   Toes:    The toes are downgoing bilaterally.   Clonus:    Clonus is absent.   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 MoCA 24/30 with -3 for visuospatial execution, -1 fluency, -2 delayed recall. MRA of the had in 2014 showed extensive ICA siphon calcified atherosclerosis so will repeat MRI of the brain to ensure no ischemic events or other etiology for symptoms. DDx is mild cognitive changes vs pseudodementia, likely a contribution from both. Will start Aricept. Will order Neuropsychiatric testing. Start Aricept 5mg  and can increase to 10mg  in a week if tolerates.    Sarina Ill, MD  [facility] [address] [place],  [address]  Phone [phone] Fax [phone]

## 2014-08-18 NOTE — Patient Instructions (Signed)
Overall you are doing fairly well but I do want to suggest a few things today:   Remember to drink plenty of fluid, eat healthy meals and do not skip any meals. Try to eat protein with a every meal and eat a healthy snack such as fruit or nuts in between meals. Try to keep a regular sleep-wake schedule and try to exercise daily, particularly in the form of walking, 20-30 minutes a day, if you can.   As far as your medications are concerned, I would like to suggest; Aricept 5mg  and can increase to 10mg  if tolerates it well  As far as diagnostic testing: Neuropsychiatric testing, MRi of the brain  I would like to see you back in 3-6 months, sooner if we need to. Please call us with any interim questions, concerns, problems, updates or refill requests.   Please also call us for any test results so we can go over those with you on the phone.  My clinical assistant and will answer any of your questions and relay your messages to me and also relay most of my messages to you.   Our phone number is [phone]. We also have an after hours call service for urgent matters and there is a physician on-call for urgent questions. For any emergencies you know to call 911 or go to the nearest emergency room

## 2014-08-19 ENCOUNTER — Encounter: Payer: Self-pay | Admitting: Cardiology

## 2014-08-19 ENCOUNTER — Ambulatory Visit (INDEPENDENT_AMBULATORY_CARE_PROVIDER_SITE_OTHER): Payer: BC Managed Care – PPO | Admitting: Cardiology

## 2014-08-19 ENCOUNTER — Telehealth: Payer: Self-pay | Admitting: Cardiology

## 2014-08-19 VITALS — BP 122/62 | HR 59 | Ht 73.0 in | Wt 203.0 lb

## 2014-08-19 DIAGNOSIS — I480 Paroxysmal atrial fibrillation: Secondary | ICD-10-CM

## 2014-08-19 DIAGNOSIS — I714 Abdominal aortic aneurysm, without rupture, unspecified: Secondary | ICD-10-CM

## 2014-08-19 DIAGNOSIS — I739 Peripheral vascular disease, unspecified: Secondary | ICD-10-CM

## 2014-08-19 DIAGNOSIS — I6529 Occlusion and stenosis of unspecified carotid artery: Secondary | ICD-10-CM

## 2014-08-19 LAB — CBC WITH DIFFERENTIAL/PLATELET
BASOS PCT: 0.4 % (ref 0.0–3.0)
Basophils Absolute: 0 10*3/uL (ref 0.0–0.1)
EOS ABS: 0.1 10*3/uL (ref 0.0–0.7)
Eosinophils Relative: 1 % (ref 0.0–5.0)
HCT: 38 % — ABNORMAL LOW (ref 39.0–52.0)
HEMOGLOBIN: 12.3 g/dL — AB (ref 13.0–17.0)
Lymphocytes Relative: 13.7 % (ref 12.0–46.0)
Lymphs Abs: 1 10*3/uL (ref 0.7–4.0)
MCHC: 32.4 g/dL (ref 30.0–36.0)
MCV: 86.1 fl (ref 78.0–100.0)
Monocytes Absolute: 0.6 10*3/uL (ref 0.1–1.0)
Monocytes Relative: 8 % (ref 3.0–12.0)
NEUTROS ABS: 5.4 10*3/uL (ref 1.4–7.7)
NEUTROS PCT: 76.9 % (ref 43.0–77.0)
Platelets: 264 10*3/uL (ref 150.0–400.0)
RBC: 4.41 Mil/uL (ref 4.22–5.81)
RDW: 15.1 % (ref 11.5–15.5)
WBC: 7 10*3/uL (ref 4.0–10.5)

## 2014-08-19 LAB — BASIC METABOLIC PANEL
BUN: 19 mg/dL (ref 6–23)
CHLORIDE: 106 meq/L (ref 96–112)
CO2: 27 meq/L (ref 19–32)
Calcium: 9.5 mg/dL (ref 8.4–10.5)
Creatinine, Ser: 1.3 mg/dL (ref 0.4–1.5)
GFR: 56.37 mL/min — ABNORMAL LOW (ref >60.00)
Glucose, Bld: 108 mg/dL — ABNORMAL HIGH (ref 70–99)
Potassium: 5.1 mEq/L (ref 3.5–5.1)
Sodium: 139 mEq/L (ref 135–145)

## 2014-08-19 MED ORDER — CILOSTAZOL 100 MG PO TABS: 100.0000 mg | ORAL_TABLET | Freq: Two times a day (BID) | ORAL | Status: DC

## 2014-08-19 NOTE — Patient Instructions (Addendum)
Your physician recommends that you have lab--BMET/CBCd.  Take Cilostazol 100mg  two times a day  Your physician wants you to follow-up in: 6 months with Dr Aundra Dubin (May 2016). You will receive a reminder letter in the mail two months in advance. If you don't receive a letter, please call our office to schedule the follow-up appointment.

## 2014-08-19 NOTE — Telephone Encounter (Signed)
busy

## 2014-08-19 NOTE — Telephone Encounter (Signed)
NA

## 2014-08-19 NOTE — Telephone Encounter (Signed)
New message     Wife calling wants to discuss today office visit & medication .      Dr. Lavell Anchors prescribe new medication multaq 400 mg.

## 2014-08-19 NOTE — Progress Notes (Signed)
Patient ID: Troy [name], male   DOB: 25-Apr-1941, 73 y.o.   MRN: [id number] PCP: Dr. Linna Darner  73 yo with history of HTN, COPD, active smoking/COPD, carotid stenosis s/p right CEA, and PAD presents for cardiology followup.  He does not have known CAD but is at high risk for CAD based on his comorbidities.  Lexiscan Cardiolite in 3/14 showed no ischemia or infarction and echo in 3/14 showed normal EF.  He had a left fem-pop bypass at the [facility] in the '90s.  He is followed at [facility] for PAD, last ABIs in 4/15 showed mild disease on the left and moderate disease on the right. He has chronic right calf/thigh/buttocks claudication that is unchanged over the last few years and follows regularly at [facility].  He has had right lower lobectomy for lung cancer.  He continues to stay off cigarettes.    At a prior appointment, he was in atrial fibrillation but did not realize it.  I started him on Eliquis and diltiazem CD, and he spontaneously converted to NSR.  In 5/15, he was at [facility] and felt "strange" one day: fatigued, weak, short of breath.  He went to the ER and was in atrial fibrillation with HR in 80s-90s.  He spontaneously converted to NSR in the ER.  He felt back to normal after converting to NSR.  He has had no chest pain and is able to walk without significant exertional dyspnea.  I started him on Multaq 400 mg bid.  He has remained in NSR since then.  He has spinal stenosis which has been causing some back pain recently.  He has some generalized fatigue.   Labs (3/13): LDL 75, HDL 35, K 4.1, creatinine 1.1 Labs (3/14): LDL 91, HDL 27 Labs (10/14): K 4.3, creatinine 0.98 Labs (4/15): K 4.9, creatinine 1.1, LDL 76, HDL 30 Labs (5/15): K 4.3, creatinine 1.7, BNP 251 Labs (6/15): K 4.4, creatinine 1.3 Labs (10/15): TSH normal  ECG: NSR, normal  PMH: 1. HTN: ACEI cough.  2. COPD: Quit smoking 2014.  3. AAA: CT 1/13 with 3.0 cm AAA.  Abdominal US (1/14) with 3.4 cm AAA. Abdominal US (4/15) with 3.25  x 3.27 AAA.  4. Squamous cell lung cancer diagnosed 2/12.  Had right lower lobectomy in 4/12.  5. Hyperlipidemia 6. PAD: Left fem-pop bypass 1994.  ABIs (2/12) 0.62 on right, 0.95 on left. ABIs (1/14): 0.65 on right, 1.04 on left. ABIs (4/15) 0.66 right, 0.87 left.  7. Stress myoview 2004 was normal. Lexiscan Cardiolite (3/14) with EF 66%, no ischemia or infarction.  8. Echo (3/14): technically difficult with EF 55-60%, upper normal RV size.  9. Carotid stenosis: TIA 10/14.  Carotid dopplers with > 02% RICA, 72-53% LICA.  Patient had right CEA in 10/14. Carotids (4/15) patent right CEA, LICA 66-44% stenosis.  Carotids (03/47) < 42% LICA, patent right CEA.  10. Atrial fibrillation: Paroxysmal. First noted after lobectomy in 4/12 (brief), recurrence in 4/15 then in 5/15.  11. Spinal stenosis  SH: Married, lives in [place].  [place].  Quit smoking in 2014.   FH: No premature CAD  ROS: All systems reviewed and negative except as per HPI.   Current Outpatient Prescriptions  Medication Sig Dispense Refill  . bimatoprost (LUMIGAN) 0.03 % ophthalmic solution Place 1 drop into both eyes at bedtime.    [name] BIOTIN PO Take 5,000 mcg by mouth daily.    . Bisacodyl (DULCOLAX PO) Take by mouth as needed.    [name]  brimonidine (ALPHAGAN P) 0.1 % SOLN Place 3 drops into the left eye 3 (three) times daily.     [name] CARDURA 4 MG tablet TAKE (1/2) TABLET DAILY. 45 tablet 1  . cilostazol (PLETAL) 100 MG tablet Take 1 tablet (100 mg total) by mouth 2 (two) times daily. 180 tablet 1  . CRESTOR 40 MG tablet TAKE 1 TABLET ONCE DAILY. 90 tablet 0  . diltiazem (CARDIZEM CD) 120 MG 24 hr capsule TAKE 1 CAPSULE DAILY. 30 capsule 1  . donepezil (ARICEPT) 5 MG tablet Take 1 tablet (5 mg total) by mouth at bedtime. 30 tablet 0  . dorzolamide-timolol (COSOPT) 22.3-6.8 MG/ML ophthalmic solution Place 2 drops into the right eye 2 (two) times daily.    [name] dronedarone (MULTAQ) 400 MG tablet Take 1 tablet (400 mg  total) by mouth 2 (two) times daily with a meal. 60 tablet 6  . ELIQUIS 5 MG TABS tablet TAKE 1 TABLET TWICE DAILY. 60 tablet 3  . FLUoxetine (PROZAC) 20 MG tablet Take 1 tablet (20 mg total) by mouth daily. 30 tablet 2  . MELATONIN PO Take 3 mg by mouth at bedtime as needed (sleep).     . Multiple Vitamins-Minerals (CENTRUM SILVER PO) Take 1 tablet by mouth daily.    . ramipril (ALTACE) 2.5 MG capsule TAKE (1) CAPSULE DAILY. 30 capsule 1  . ranitidine (ZANTAC) 150 MG tablet Take 150 mg by mouth as needed for heartburn.     No current facility-administered medications for this visit.    BP 122/62 mmHg  Pulse 59  Ht 6\' 1"  (1.854 m)  Wt 203 lb (92.08 kg)  BMI 26.79 kg/m2 General: NAD Neck: JVP 7 cm, no thyromegaly or thyroid nodule.  Lungs: Mildly decreased breath sounds right base.  CV: Nondisplaced PMI.  Heart regular S1/S2, no S3/S4, 1/6 SEM RUSB.  Trace ankle edema on left.  No carotid bruit. Right foot is warm but unable to palpate PT pulse.   Abdomen: Soft, nontender, no hepatosplenomegaly, no distention.   Neurologic: Alert and oriented x 3.  Psych: Normal affect. Extremities: No clubbing or cyanosis.   Assessment/Plan: 1. Chest pain: He has had no further chest pain.  Lexiscan Cardiolite was negative in 3/14 and EF was preserved on echo.   2. Hyperlipidemia: Good lipids in 4/15.  3. Carotid stenosis: s/p R CEA.  Will follow with [facility] for residual LICA stenosis.  4. PAD: Stable but significant claudication.  No foot ulcerations or rest pain.  Followed at [facility]. He is on cilostazol. He can try increasing this to 100 mg bid to see if it helps his symptoms.  5. AAA: Small and stable on recent US.    6. COPD: Dyspnea improved with Spiriva and now that he has stopped smoking.   7. Atrial fibrillation: Patient remains in NSR on dronedarone.  CHADSVASC 3 (age, vascular disease, HTN).  He is also on diltiazem CD 120 mg daily.   8. CKD: Creatinine improved when checked in 6/15.  BMET today.     [name] 08/19/2014

## 2014-08-20 NOTE — Telephone Encounter (Signed)
Spoke with pt's wife -she states pt has missed 2/3 doses of Multaq in over 30 days, she counted the pills.  Pt's wife states pt is unwilling to take Multaq regularly because it makes him tired. Pt is aware that this will help keep him in [name]  Pt was prescribed Aricept by other MD. Pt's wife was told by pharmacy there is a potential interaction between Aricept and Multaq.  Pt's wife aware I am forwarding this to Dr Aundra Dubin for review.

## 2014-08-20 NOTE — Telephone Encounter (Signed)
LMTCB for pt's wife. 

## 2014-08-20 NOTE — Telephone Encounter (Signed)
-----   Message from Larey Dresser, MD sent at 08/20/2014  5:07 PM EST ----- Cannot take Multaq and Aricept.  Needs to stop one or the other, sounds like he wants to stop the Multaq.

## 2014-08-20 NOTE — Telephone Encounter (Signed)
F/u   Patient wife Troy [name] returning call, call home phone first then please contact on cell at [phone].

## 2014-08-20 NOTE — Telephone Encounter (Signed)
Does he feel less tired when he takes it? If he feels better off it, can stop for now and we'll just see if he can stay out of atrial fibrillation on his own.  If he goes back into atrial fibrillation, there are other options (Tikosyn, amiodarone).

## 2014-08-23 ENCOUNTER — Telehealth: Payer: Self-pay

## 2014-08-23 NOTE — Telephone Encounter (Signed)
Troy [name] - Would you mind calling Dr. Claris Gladden office and checking if Aricept is ok with him given patient's cardiac issues please? Patient's QTc is normal but would prefer OK from his cardiologist. Thank you.

## 2014-08-23 NOTE — Telephone Encounter (Signed)
[employer] sent a fax saying the patient is taking Multaq from Dr Aundra Dubin, and that in combination with Aricept could prolong the QT interval which may result in additive or synergistic effects on the QT interval.  They want to verify it is okay to still dispense Aricept or if a med change is needed.  Please advise.  Thank you.

## 2014-08-23 NOTE — Telephone Encounter (Signed)
Voice Mail

## 2014-08-23 NOTE — Telephone Encounter (Signed)
I spoke with patient's wife. She states she thinks pt is going to stop Multaq and see how he gets along off Multaq. Pt has not started Aricept yet. I attempted to contact pt at [phone] to discuss with patient and did not get an answer.

## 2014-08-24 ENCOUNTER — Telehealth: Payer: Self-pay | Admitting: Internal Medicine

## 2014-08-24 ENCOUNTER — Telehealth: Payer: Self-pay | Admitting: Cardiology

## 2014-08-24 ENCOUNTER — Telehealth: Payer: Self-pay | Admitting: Neurology

## 2014-08-24 NOTE — Telephone Encounter (Signed)
According to a note from [place] on 08/20/2014, the patient decided that he was no longer going to take Multaq, so there should be no problem in starting Aricept. Janett Billow informed of this.

## 2014-08-24 NOTE — Telephone Encounter (Signed)
I called back.  Was transferred to Triage Nurse.  Dr Aundra Dubin is not in the office this week.  She will check with their pharmacist and call us back.

## 2014-08-24 NOTE — Telephone Encounter (Signed)
I spoke with Triage nurse, Ms Vaughan Basta.  She consulted their pharmacist, and after reviewing the patient's notes, they discovered the patient has actually discontinued Multaq, therefore they feel it is okay for the patient to begin Aricept.  I called the pharmacy.  Spoke with [place].  She is aware and will notate the patient file and call us back if anything further is needed.

## 2014-08-24 NOTE — Telephone Encounter (Signed)
LMTCB- for Janett Billow at Dr Jaynee Eagles office, in regards to medication questions for Dr Aundra Dubin

## 2014-08-24 NOTE — Telephone Encounter (Signed)
New message      Can pt take aricept 5mg  then increase to 10mg  after 1 month.  Taking this medication along with other heart meds can cause prolonged QT.  They want to make sure it is ok with Dr Aundra Dubin to start this medication

## 2014-08-24 NOTE — Telephone Encounter (Signed)
Dr. Claris Gladden office returning [place] call, wants you to call the office back @ [phone] and ask for Triage Nurse.  They need to know how you came up with the equation for the Aricept.

## 2014-08-24 NOTE — Telephone Encounter (Signed)
I called the pharmacy.  Spoke with Abigail Butts.  She provided a number of [phone] for Dr Claris Gladden office.  I called that office.  Info was relayed to operator.  They will forward question to MD and call us back.

## 2014-08-24 NOTE — Telephone Encounter (Signed)
I spoke with Triage nurse, Ms Vaughan Basta.  She consulted their pharmacist, and after reviewing the patient's notes, they discovered the patient has actually discontinued Multaq, therefore they feel it is okay for the patient to begin Aricept.  I called the pharmacy.  Spoke with [place].  She is aware and will notate the patient file and call us back if anything further is needed.

## 2014-08-27 IMAGING — CR DG CHEST 2V
2 series · 2 of 2 positions shown · non-contrast
Comparison: 07/06/2013 chest CT.

CLINICAL DATA: Preoperative study. Carotid surgery. Productive
cough

EXAM:
CHEST  2 VIEW

[w chest pa]
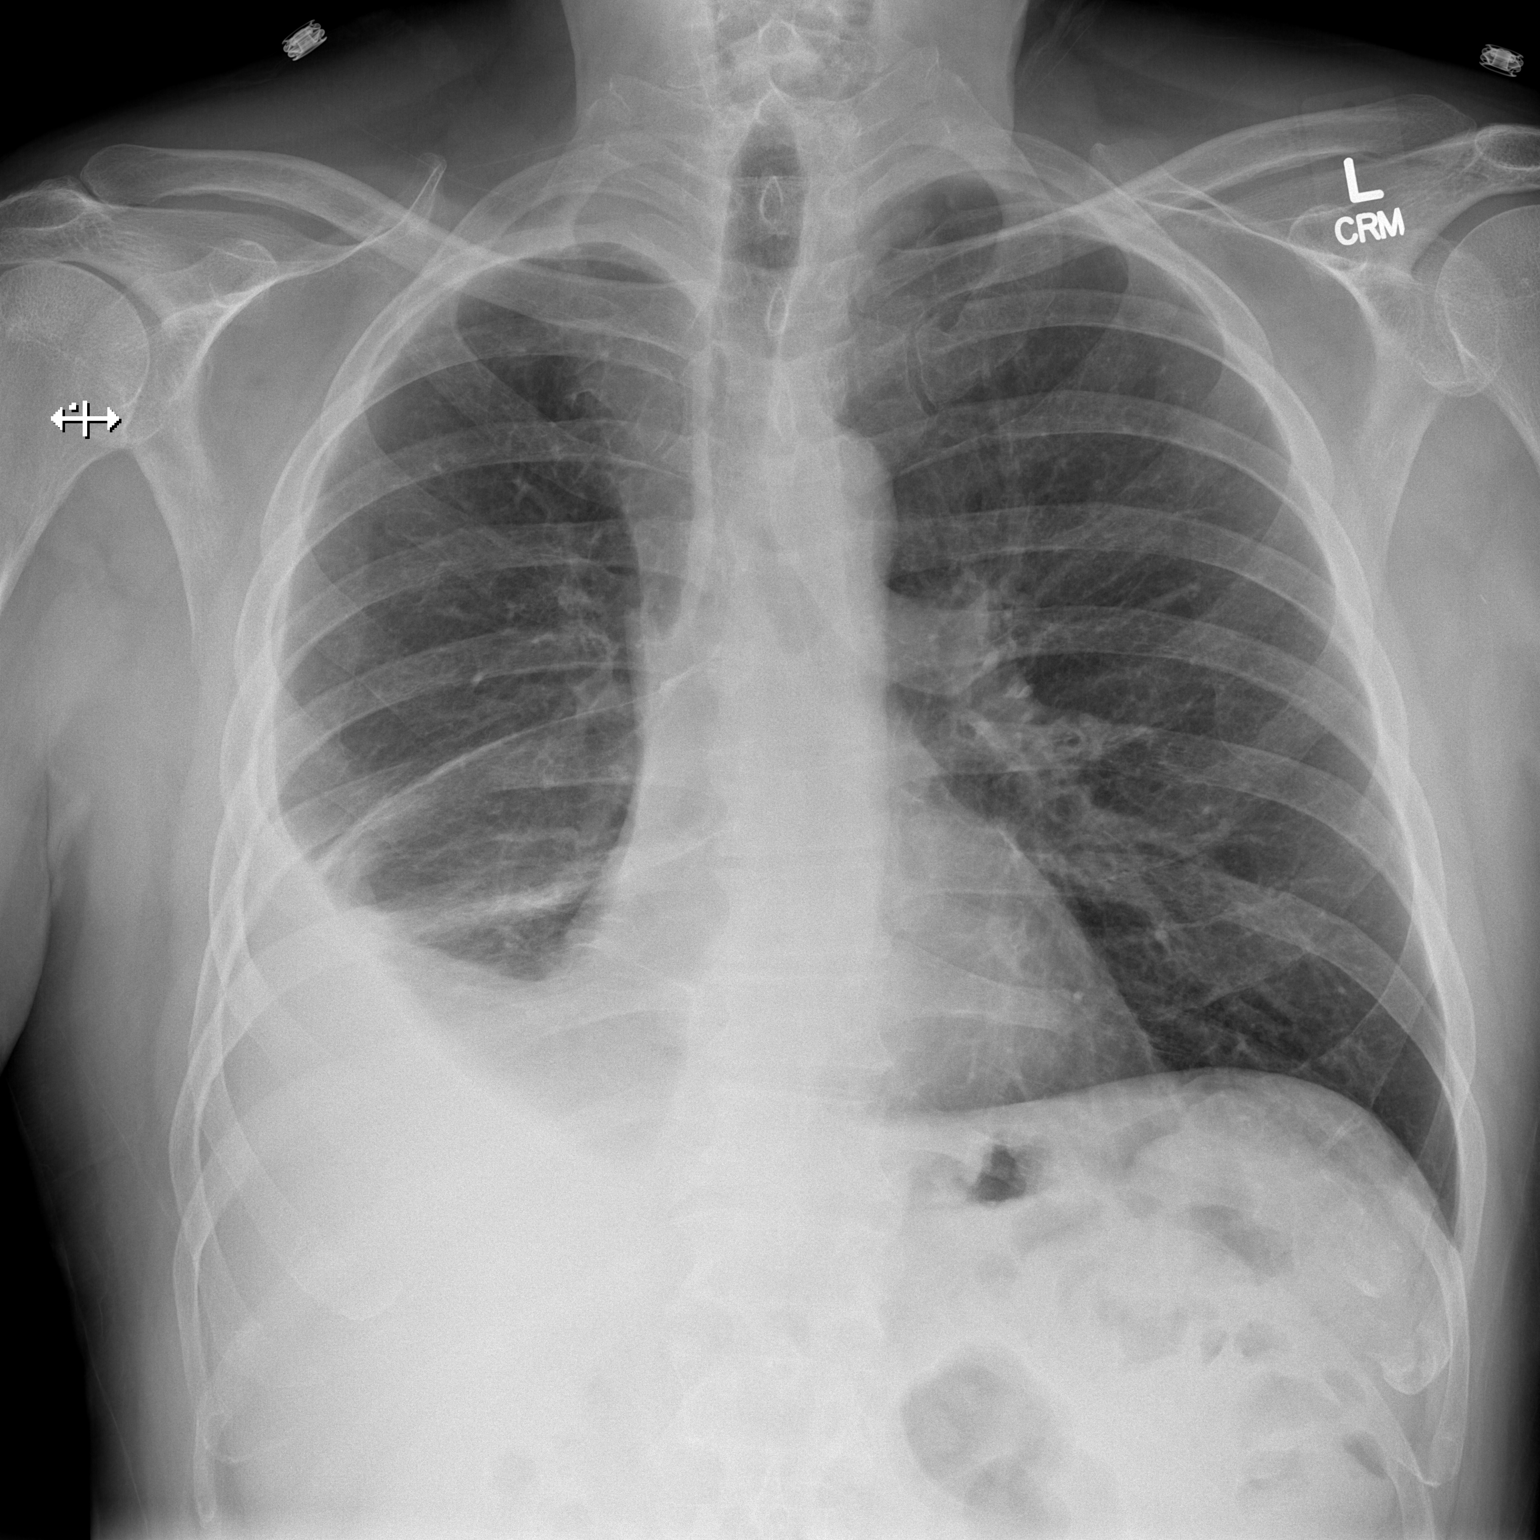

[w chest lat]
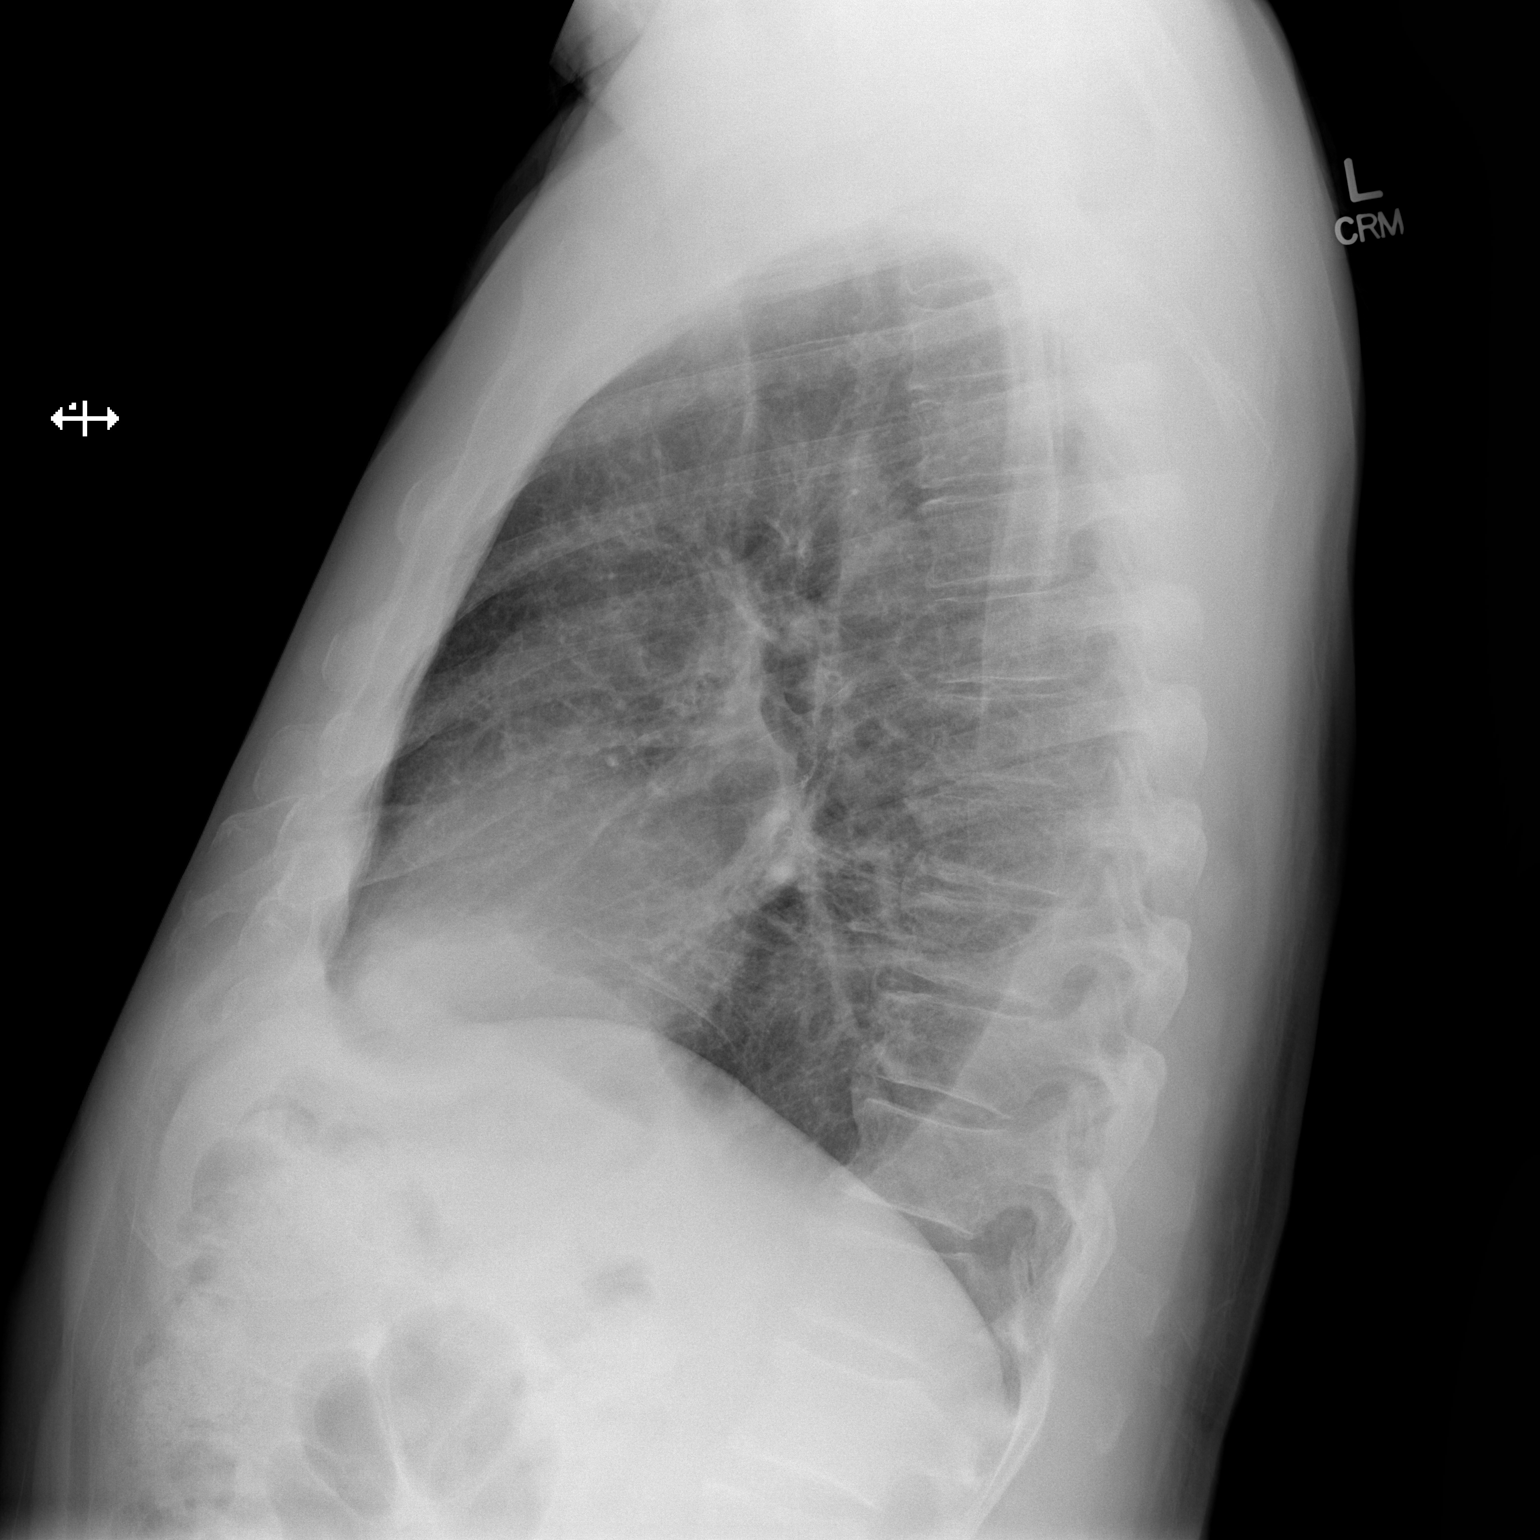

[2 of 2 positions shown; findings below may reference images not displayed]

FINDINGS: Chronic pleural effusion and pleural thickening at the right base,
status post right lower lobectomy. No acute infiltrate, edema, or
pneumothorax. Normal heart size. Postoperative distortion and
mediastinal contours on the right.
IMPRESSION: Stable appearance of the chest with chronic right pleural scarring
and effusion. No evidence of active cardiopulmonary disease.

## 2014-09-01 NOTE — Telephone Encounter (Signed)
Pt states he has stopped Multaq.  He does think he is less tired off of Multaq.

## 2014-09-09 ENCOUNTER — Encounter (HOSPITAL_COMMUNITY): Payer: Self-pay | Admitting: Vascular Surgery

## 2014-09-26 ENCOUNTER — Other Ambulatory Visit: Payer: Self-pay | Admitting: Cardiology

## 2014-10-05 ENCOUNTER — Telehealth: Payer: Self-pay | Admitting: Cardiology

## 2014-10-05 NOTE — Telephone Encounter (Signed)
Left message to call back  

## 2014-10-05 NOTE — Telephone Encounter (Signed)
Spoke with wife and she stated she had been out of town for a week and when she got home today patient stated he didn't rest well last night Per wife patient thinks he was in Afib and heart rate fast, today tired and currently resting Questioned wife about patient currently being in Afib and she was unsure  She stated she did think patient had taken medications daily except 1 day  She offered to wake patient up since he had been resting for an hour.  Advised to do so and find out if patient still in Afib and how he was feeling. Told her I would call back  Tried to call wife back, left message to call back

## 2014-10-05 NOTE — Telephone Encounter (Signed)
New message      Pt has been off multaq for 2  Months.  He now has AFIB.  Wife has been out if town for 1 wk and is not sure if pt took his medications correctly.  Should he restart his multaq?  Wife want to restart it.

## 2014-10-07 NOTE — Telephone Encounter (Signed)
Pt's wife states that pt feels his heart rate and rhythm is better now, pt at work today. Pt's wife states pt restarted Multaq 400mg  bid 2 days ago on his own.  She states that pt is requesting alternative medication to Multaq, pt feels it causes diarrhea and makes him feel bad.

## 2014-10-07 NOTE — Telephone Encounter (Signed)
Amiodarone or Tikosyn would be alternatives to Multaq.  For Tikosyn, would need to initiate with 3 days in hospital.  For amiodarone, he can start as outpatient but will have to be monitored closely for liver, thyroid, etc. Can have office appt to discuss.  Both work well.

## 2014-10-07 NOTE — Telephone Encounter (Signed)
Pt's wife aware I will forward to Dr Aundra Dubin for review.

## 2014-10-07 NOTE — Telephone Encounter (Signed)
I discussed Dr Claris Gladden recommendations  with pt's wife.  She said she would discuss with patient and call me back.

## 2014-10-26 ENCOUNTER — Telehealth: Payer: Self-pay | Admitting: *Deleted

## 2014-10-26 NOTE — Telephone Encounter (Signed)
Left message for patient's wife to return call

## 2014-10-26 NOTE — Telephone Encounter (Signed)
Higher dose would increase risk of Serotonin Syndrome because of potential drug to drug interactions. Please see me to discuss other opotions

## 2014-10-26 NOTE — Telephone Encounter (Signed)
Wife states pt is wanting to increase the dosage on his fluoxetine. She states he feel like med is not helping as much...Troy [name]

## 2014-11-02 ENCOUNTER — Other Ambulatory Visit: Payer: Self-pay | Admitting: Internal Medicine

## 2014-11-02 NOTE — Telephone Encounter (Signed)
RX 3 mos

## 2014-11-30 ENCOUNTER — Ambulatory Visit: Payer: Medicare Other | Admitting: Psychology

## 2014-12-19 ENCOUNTER — Other Ambulatory Visit: Payer: Self-pay | Admitting: Cardiology

## 2014-12-20 ENCOUNTER — Telehealth: Payer: Self-pay

## 2014-12-20 NOTE — Telephone Encounter (Signed)
Phone call from pt.  Requesting to move his appt. scheduled on 01/26/15 to an earlier date.  Stated he has been having increased pain in calves with walking with right > left.  Stated he would like to be seen sooner and discuss possible need for surgery.  Denies rest pain.  Denies any open sores of lower extremities.  Denies swelling.  Advised will call pt. back with earlier appt. options.  Agrees with plan.

## 2014-12-22 ENCOUNTER — Other Ambulatory Visit (HOSPITAL_COMMUNITY): Payer: Medicare Other

## 2014-12-22 ENCOUNTER — Encounter (HOSPITAL_COMMUNITY): Payer: Self-pay

## 2014-12-27 ENCOUNTER — Encounter: Payer: Self-pay | Admitting: Family

## 2014-12-28 ENCOUNTER — Ambulatory Visit (HOSPITAL_COMMUNITY)
Admission: RE | Admit: 2014-12-28 | Discharge: 2014-12-28 | Disposition: A | Discharge status: Home / Self Care | Payer: BLUE CROSS/BLUE SHIELD | Source: Ambulatory Visit | Attending: Family | Admitting: Family

## 2014-12-28 ENCOUNTER — Encounter: Payer: Self-pay | Admitting: Family

## 2014-12-28 ENCOUNTER — Ambulatory Visit (INDEPENDENT_AMBULATORY_CARE_PROVIDER_SITE_OTHER): Payer: BLUE CROSS/BLUE SHIELD | Admitting: Family

## 2014-12-28 ENCOUNTER — Ambulatory Visit (INDEPENDENT_AMBULATORY_CARE_PROVIDER_SITE_OTHER)
Admission: RE | Admit: 2014-12-28 | Discharge: 2014-12-28 | Disposition: A | Discharge status: Home / Self Care | Payer: BLUE CROSS/BLUE SHIELD | Source: Ambulatory Visit | Attending: Family | Admitting: Family

## 2014-12-28 VITALS — BP 124/76 | HR 66 | Resp 16 | Ht 73.0 in | Wt 200.0 lb

## 2014-12-28 DIAGNOSIS — M79662 Pain in left lower leg: Secondary | ICD-10-CM

## 2014-12-28 DIAGNOSIS — Z87891 Personal history of nicotine dependence: Secondary | ICD-10-CM

## 2014-12-28 DIAGNOSIS — M79661 Pain in right lower leg: Secondary | ICD-10-CM

## 2014-12-28 DIAGNOSIS — I714 Abdominal aortic aneurysm, without rupture, unspecified: Secondary | ICD-10-CM

## 2014-12-28 DIAGNOSIS — Z9889 Other specified postprocedural states: Secondary | ICD-10-CM

## 2014-12-28 DIAGNOSIS — I6523 Occlusion and stenosis of bilateral carotid arteries: Secondary | ICD-10-CM | POA: Diagnosis not present

## 2014-12-28 DIAGNOSIS — Z95828 Presence of other vascular implants and grafts: Secondary | ICD-10-CM

## 2014-12-28 NOTE — Progress Notes (Signed)
[facility] [place] HISTORY AND PHYSICAL   MRN : [id number]  History of Present Illness:   Troy [name] is a 74 y.o. male who has been followed for many years by Dr. [name] for peripheral arterial disease, a small abdominal aortic aneurysm, and is s/p left CEA on 07/14/13 by Dr. [name].  He returns today for follow up.   Medical problems include hypertension hyperlipidemia glaucoma abdominal aortic aneurysm (3.4 cm in January 2014) and remote history of lung cancer all of which are currently.  After about 1/2 mile he develops bilateral buttocks and calf pain, worse in the right, denies non healing wounds, worse in the last 6 months.   Dr. Susa Day, ortho, determined that he has spinal stenosis, which he states has affected his walking and he has therefore decreased his walking significantly. He denies any stroke activity since his TIA before the CEA as manifested by left arm tremor and weakness, both legs giving way, and seeing purple spots in both eyes, he has no residual neurological deficits.  He reports left lower back pain in the last 2 weeks, a firm mattress and walking improves this pain, no aggravating factors; he denies fever or chills, denies urinary difficulties.  Pt Diabetic: No  Pt smoker: former smoker, quit October, 2014  Pt meds include: Statin :Yes Betablocker: no ASA: No Other anticoagulants/antiplatelets: Eliquis for atrial fib prescribed by [name], Plavix stopped He also takes Pletal    Current Outpatient Prescriptions  Medication Sig Dispense Refill  . bimatoprost (LUMIGAN) 0.03 % ophthalmic solution Place 1 drop into both eyes at bedtime.    [name] BIOTIN PO Take 5,000 mcg by mouth daily.    . Bisacodyl (DULCOLAX PO) Take by mouth as needed.    . brimonidine (ALPHAGAN P) 0.1 % SOLN Place 3 drops into the left eye 3 (three) times daily.     [name] CARDURA 4 MG tablet TAKE (1/2) TABLET DAILY. 45 tablet 1  . cilostazol (PLETAL) 100  MG tablet Take 1 tablet (100 mg total) by mouth 2 (two) times daily. 180 tablet 1  . CRESTOR 40 MG tablet TAKE 1 TABLET ONCE DAILY. 90 tablet 0  . diltiazem (CARDIZEM CD) 120 MG 24 hr capsule TAKE 1 CAPSULE DAILY. 30 capsule 3  . donepezil (ARICEPT) 5 MG tablet Take 1 tablet (5 mg total) by mouth at bedtime. 30 tablet 0  . dorzolamide-timolol (COSOPT) 22.3-6.8 MG/ML ophthalmic solution Place 2 drops into the right eye 2 (two) times daily.    [name] ELIQUIS 5 MG TABS tablet TAKE 1 TABLET TWICE DAILY. 60 tablet 1  . FLUoxetine (PROZAC) 20 MG capsule TAKE (1) CAPSULE DAILY. 90 capsule 0  . MELATONIN PO Take 3 mg by mouth at bedtime as needed (sleep).     . Multiple Vitamins-Minerals (CENTRUM SILVER PO) Take 1 tablet by mouth daily.    . ramipril (ALTACE) 2.5 MG capsule TAKE (1) CAPSULE DAILY. 30 capsule 3  . ranitidine (ZANTAC) 150 MG tablet Take 150 mg by mouth as needed for heartburn.    [name] FLUoxetine (PROZAC) 20 MG tablet Take 1 tablet (20 mg total) by mouth daily. (Patient not taking: Reported on 12/28/2014) 30 tablet 2   No current facility-administered medications for this visit.    Past Medical History  Diagnosis Date  . Hypertension   . Hyperlipidemia   . Glaucoma BOTH EYES    Dr Gershon Crane  . Hx of adenomatous colonic polyps 2005     X  2; 1 hyperplastic polyp; Dr Olevia Perches  . Impaired fasting glucose 2007    108; A1c5.4%  . History of lung cancer APRIL 2012  SQUAMOUS CELL---- S/P RIGHT LOWER LOBECTOMY AT [facility] --  NO CHEMORADIATION---  NO RECURRENCE     ONCOLOGIST- DR Tressie Stalker  LOV IN [facility] 10-27-2012  . GERD (gastroesophageal reflux disease)   . BPH (benign prostatic hypertrophy)   . Frequency of urination   . Urgency of urination   . Nocturia   . Lesion of bladder   . Microhematuria   . Arthritis   . History of basal cell carcinoma excision   . Exertional dyspnea   . Peripheral vascular disease S/P ANGIOPLASTY AND STENTING    FOLLOWED  BY DR [name]  . PAD (peripheral artery  disease) ABI'S  JAN 2014  0.65 ON RIGHT ;  1.04 ON LEFT  . AAA (abdominal aortic aneurysm) LAST ABD. [name]  JAN 2014  3.4CM    MONITORED BY DR [name]  . Bilateral carotid artery stenosis DUPLEX 12-29-2012  BY DR [facility]    BILATERAL ICA STENOSIS 60-79%  . COPD (chronic obstructive pulmonary disease)   . Stroke Jul 07, 2013    mini  TIA  . Cancer 01-25-12    LUNG  . Atrial fibrillation   . Spinal stenosis Sept. 2015    Social History History  Substance Use Topics  . Smoking status: Former Smoker -- 0.50 packs/day for 50 years    Types: Cigarettes    Quit date: 07/28/2013  . Smokeless tobacco: Never Used     Comment: pt states that he is now taking chantix  . Alcohol Use: Yes     Comment:  socially, variable    Family History Family History  Problem Relation Age of Onset  . Stroke Mother     mini strokes  . Alcohol abuse Father   . Heart disease Father     MI after 88  . Stroke Father   . Hypertension Father   . Heart attack Father   . Heart disease Paternal Aunt     several  . Hypertension Paternal Aunt     several  . Stroke Paternal Aunt     several  . Stroke Paternal Uncle     several  . Heart disease Paternal Uncle     several;2 had MI pre 61    Surgical History Past Surgical History  Procedure Laterality Date  . Angio plasty      X 4 in legs  . Colon polyectomy    . Trabecular surgery      OS  . Lung lobectomy  01/24/2011     RIGHT UPPER LOBE  (SQUAMOUS CELL CARCINOMA) Dr Dorthea Cove , [facility]. No chemotherapyor radiation  . Laryngoscopy  06-27-2004    BX VOCAL CORD  (LEUKOPLAKIA)  PER PT NO ISSUES SINCE  . Cataract extraction w/ intraocular lens  implant, bilateral    . Basal cell carcinoma excision      MULTIPLE TIMES--  RIGHT FOREARM, CHEEKS, AND BACK  . Femoral-popliteal bypass graft Left 1994  [facility]    AND 2001  IN [place]  . Cardiovascular stress test  12-08-2012  DR [facility]    NORMAL LEXISCAN WITH NO EXERCISE NUCLEAR STUDY/ EF 66%/   NO ISCHEMIA/  NO SIGNIFICANT CHANGE FROM PRIOR STUDY  . Transthoracic echocardiogram  12-29-2012  DR [facility]    MILD LVH/  LVSF NORMAL/ EF 85-46%/  GRADE I DIASTOLIC DYSFUNCTION  . Aortogram  07-27-2002  MILD DIFFUSE ILIAC ARTERY OCCLUSIVE DISEASE /  LEFT RENAL ARTERY 20%/ PATENT LEFT FEM-POP GRAFT/ MILD SFA AND POPLITEAL ARTERY OCCLUSIVE DISEASE W/ SEVERE KIDNEY OCCLUSIVE DISEASE  . Cystoscopy w/ retrogrades Bilateral 01/21/2013    Procedure: CYSTOSCOPY WITH RETROGRADE PYELOGRAM;  Surgeon: Molli Hazard, MD;  Location: [facility];  Service: Urology;  Laterality: Bilateral;   CYSTO, BLADDER BIOPSY, BILATERAL RETROGRADE PYELOGRAM  RAD TECH FROM RADIOLOGY PER JOY  . Cystoscopy with biopsy N/A 01/21/2013    Procedure: CYSTOSCOPY WITH BIOPSY;  Surgeon: Molli Hazard, MD;  [facility];  Service: Urology;  Laterality: N/A;  . Endarterectomy Right 07/14/2013    Procedure: ENDARTERECTOMY CAROTID;  Surgeon: Angelia Mould, MD;  Location: [place];  Service: Vascular;  Laterality: Right;  . Patch angioplasty Right 07/14/2013    Procedure: PATCH ANGIOPLASTY;  Surgeon: Angelia Mould, MD;  Location: [facility] OR;  Service: Vascular;  Laterality: Right;  . Carotid endarterectomy Right 07-14-13    cea  . Carotid angiogram N/A 07/10/2013    Procedure: CAROTID ANGIOGRAM;  Surgeon: Elam Dutch, MD;  Location[facility] CATH LAB;  Service: Cardiovascular;  Laterality: N/A;    Allergies  Allergen Reactions  . Penicillins Shortness Of Breath and Hives    Flushing  & dyspnea Because of a history of documented adverse serious drug reaction;Medi Alert bracelet  is recommended  . Sulfonamide Derivatives Shortness Of Breath    Flushing & dyspnea Because of a history of documented adverse serious drug reaction;Medi Alert bracelet  is recommended  . Niacin-Lovastatin Er Other (See Comments)    Dyspnea, flushing  . Sulfa Antibiotics Hives  . Atorvastatin Other (See  Comments)    Myalgias & athralgias    Current Outpatient Prescriptions  Medication Sig Dispense Refill  . bimatoprost (LUMIGAN) 0.03 % ophthalmic solution Place 1 drop into both eyes at bedtime.    [name] BIOTIN PO Take 5,000 mcg by mouth daily.    . Bisacodyl (DULCOLAX PO) Take by mouth as needed.    . brimonidine (ALPHAGAN P) 0.1 % SOLN Place 3 drops into the left eye 3 (three) times daily.     [name] CARDURA 4 MG tablet TAKE (1/2) TABLET DAILY. 45 tablet 1  . cilostazol (PLETAL) 100 MG tablet Take 1 tablet (100 mg total) by mouth 2 (two) times daily. 180 tablet 1  . CRESTOR 40 MG tablet TAKE 1 TABLET ONCE DAILY. 90 tablet 0  . diltiazem (CARDIZEM CD) 120 MG 24 hr capsule TAKE 1 CAPSULE DAILY. 30 capsule 3  . donepezil (ARICEPT) 5 MG tablet Take 1 tablet (5 mg total) by mouth at bedtime. 30 tablet 0  . dorzolamide-timolol (COSOPT) 22.3-6.8 MG/ML ophthalmic solution Place 2 drops into the right eye 2 (two) times daily.    [name] ELIQUIS 5 MG TABS tablet TAKE 1 TABLET TWICE DAILY. 60 tablet 1  . FLUoxetine (PROZAC) 20 MG capsule TAKE (1) CAPSULE DAILY. 90 capsule 0  . MELATONIN PO Take 3 mg by mouth at bedtime as needed (sleep).     . Multiple Vitamins-Minerals (CENTRUM SILVER PO) Take 1 tablet by mouth daily.    . ramipril (ALTACE) 2.5 MG capsule TAKE (1) CAPSULE DAILY. 30 capsule 3  . ranitidine (ZANTAC) 150 MG tablet Take 150 mg by mouth as needed for heartburn.    [name] FLUoxetine (PROZAC) 20 MG tablet Take 1 tablet (20 mg total) by mouth daily. (Patient not taking: Reported on 12/28/2014) 30 tablet 2   No  current facility-administered medications for this visit.     REVIEW OF SYSTEMS: See HPI for pertinent positives and negatives.  Physical Examination Filed Vitals:   12/28/14 0958  BP: 124/76  Pulse: 66  Resp: 16  Height: 6\' 1"  (1.854 m)  Weight: 200 lb (90.719 kg)  SpO2: 98%   Body mass index is 26.39 kg/(m^2).  General: WDWN in NAD  Gait: Normal  HENT: WNL  Eyes: Pupils equal   Pulmonary: normal non-labored breathing , without Rales, rhonchi, wheezing  Cardiac: RRR, no murmur detected  Abdomen: soft, NT, no masses palpated. Skin: no rashes, ulcers noted; no Gangrene , no cellulitis; no open wounds.  VASCULAR EXAM  Carotid Bruits  Left  Right    Negative  Negative    VASCULAR EXAM:  Extremities without ischemic changes  without Gangrene; without open wounds; without drainage.   LE Pulses  LEFT  RIGHT   FEMORAL  palpable  palpable   POPLITEAL  not palpable  not palpable   POSTERIOR TIBIAL  not palpable  not palpable   DORSALIS PEDIS  ANTERIOR TIBIAL  not palpable  not palpable    Musculoskeletal: no muscle wasting or atrophy; no peripheral edema.  Neurologic: A&O X 3; Appropriate Affect ;  SENSATION: normal;  MOTOR FUNCTION: 5/5 Symmetric, CN 2-12 intact except for uvula deviation slightly to the left  Speech is fluent/normal        Non-Invasive Vascular Imaging:   ASSESSMENT:  Troy [name] is a 74 y.o. male has been followed for many years by Dr. [name] for peripheral arterial disease, a small abdominal aortic aneurysm, and is s/p left CEA on 07/14/13 by Dr. [name].  He returns today for follow up for ABI's and AAA Duplex, carotid Duplex in 6 months. He denies any stroke activity since his TIA before the CEA. Discussed HPI, physical exam, and noninvasive vascular lab results with Dr. Donnetta Hutching who agrees that pt's left low back, left hip, and bilateral legs pain at rest is spinal stenosis related as his ABI results are not decreased enough to warrant this degree of pain. If Dr. Tonita Cong thinks that some conservative measures to address his radiculopathy type pain such as physical therapy would help his pain, then pt would be able to walk more which will improve his collateral arterial perfusion. Will defer to Dr. Tonita Cong re treatment of spinal stenosis.   Face to face time with patient was 25 minutes. Over 50% of this  time was spent on counseling and coordination of care.   PLAN:   Based on today's exam and non-invasive vascular lab results, the patient will follow up in November 2016 as scheduled for carotid Duplex, will add ABI's and left LE arterial Duplex for graft evaluation. Will check AAA in about a year.  I discussed in depth with the patient the nature of atherosclerosis, and emphasized the importance of maximal medical management including strict control of blood pressure, blood glucose, and lipid levels, obtaining regular exercise, and cessation of smoking.  The patient is aware that without maximal medical management the underlying atherosclerotic disease process will progress, limiting the benefit of any interventions. Consideration for repair of AAA would be made when the size approaches 4.8 or 5.0 cm, growth > 1 cm/yr, and symptomatic status. The patient was given information about stroke prevention and what symptoms should prompt the patient to seek immediate medical care. The patient was given information about AAA including signs, symptoms, treatment,  what symptoms should prompt the patient to  seek immediate medical care, and how to minimize the risk of enlargement and rupture of aneurysms. The patient was given information about PAD including signs, symptoms, treatment, what symptoms should prompt the patient to seek immediate medical care, and risk reduction measures to take. Thank you for allowing [name] to participate in this patient's care.  Clemon Chambers, RN, MSN, FNP-C [facility] Office: [phone]  Clinic MD: Kellie Simmering 12/28/2014 10:23 AM

## 2014-12-28 NOTE — Patient Instructions (Addendum)
Abdominal Aortic Aneurysm An aneurysm is a weakened or damaged part of an artery wall that bulges from the normal force of blood pumping through the body. An abdominal aortic aneurysm is an aneurysm that occurs in the lower part of the aorta, the main artery of the body.  The major concern with an abdominal aortic aneurysm is that it can enlarge and burst (rupture) or blood can flow between the layers of the wall of the aorta through a tear (aorticdissection). Both of these conditions can cause bleeding inside the body and can be life threatening unless diagnosed and treated promptly. CAUSES  The exact cause of an abdominal aortic aneurysm is unknown. Some contributing factors are:   A hardening of the arteries caused by the buildup of fat and other substances in the lining of a blood vessel (arteriosclerosis).  Inflammation of the walls of an artery (arteritis).   Connective tissue diseases, such as Marfan syndrome.   Abdominal trauma.   An infection, such as syphilis or staphylococcus, in the wall of the aorta (infectious aortitis) caused by bacteria. RISK FACTORS  Risk factors that contribute to an abdominal aortic aneurysm may include:  Age older than 60 years.   High blood pressure (hypertension).  Male gender.  Ethnicity (white race).  Obesity.  Family history of aneurysm (first degree relatives only).  Tobacco use. PREVENTION  The following healthy lifestyle habits may help decrease your risk of abdominal aortic aneurysm:  Quitting smoking. Smoking can raise your blood pressure and cause arteriosclerosis.  Limiting or avoiding alcohol.  Keeping your blood pressure, blood sugar level, and cholesterol levels within normal limits.  Decreasing your salt intake. In somepeople, too much salt can raise blood pressure and increase your risk of abdominal aortic aneurysm.  Eating a diet low in saturated fats and cholesterol.  Increasing your fiber intake by including  whole grains, vegetables, and fruits in your diet. Eating these foods may help lower blood pressure.  Maintaining a healthy weight.  Staying physically active and exercising regularly. SYMPTOMS  The symptoms of abdominal aortic aneurysm may vary depending on the size and rate of growth of the aneurysm.Most grow slowly and do not have any symptoms. When symptoms do occur, they may include:  Pain (abdomen, side, lower back, or groin). The pain may vary in intensity. A sudden onset of severe pain may indicate that the aneurysm has ruptured.  Feeling full after eating only small amounts of food.  Nausea or vomiting or both.  Feeling a pulsating lump in the abdomen.  Feeling faint or passing out. DIAGNOSIS  Since most unruptured abdominal aortic aneurysms have no symptoms, they are often discovered during diagnostic exams for other conditions. An aneurysm may be found during the following procedures:  Ultrasonography (A one-time screening for abdominal aortic aneurysm by ultrasonography is also recommended for all men aged 65-75 years who have ever smoked).  X-ray exams.  A computed tomography (CT).  Magnetic resonance imaging (MRI).  Angiography or arteriography. TREATMENT  Treatment of an abdominal aortic aneurysm depends on the size of your aneurysm, your age, and risk factors for rupture. Medication to control blood pressure and pain may be used to manage aneurysms smaller than 6 cm. Regular monitoring for enlargement may be recommended by your caregiver if:  The aneurysm is 3-4 cm in size (an annual ultrasonography may be recommended).  The aneurysm is 4-4.5 cm in size (an ultrasonography every 6 months may be recommended).  The aneurysm is larger than 4.5 cm in   size (your caregiver may ask that you be examined by a vascular surgeon). If your aneurysm is larger than 6 cm, surgical repair may be recommended. There are two main methods for repair of an aneurysm:   Endovascular  repair (a minimally invasive surgery). This is done most often.  Open repair. This method is used if an endovascular repair is not possible. Document Released: 06/27/2005 Document Revised: 01/12/2013 Document Reviewed: 10/17/2012 [facility] Patient Information 2015 [place], [place]. This information is not intended to replace advice given to you by your health care provider. Make sure you discuss any questions you have with your health care provider.    Peripheral Vascular Disease Peripheral Vascular Disease (PVD), also called Peripheral Arterial Disease (PAD), is a circulation problem caused by cholesterol (atherosclerotic plaque) deposits in the arteries. PVD commonly occurs in the lower extremities (legs) but it can occur in other areas of the body, such as your arms. The cholesterol buildup in the arteries reduces blood flow which can cause pain and other serious problems. The presence of PVD can place a person at risk for Coronary Artery Disease (CAD).  CAUSES  Causes of PVD can be many. It is usually associated with more than one risk factor such as:   High Cholesterol.  Smoking.  Diabetes.  Lack of exercise or inactivity.  High blood pressure (hypertension).  Obesity.  Family history. SYMPTOMS   When the lower extremities are affected, patients with PVD may experience:  Leg pain with exertion or physical activity. This is called INTERMITTENT CLAUDICATION. This may present as cramping or numbness with physical activity. The location of the pain is associated with the level of blockage. For example, blockage at the abdominal level (distal abdominal aorta) may result in buttock or hip pain. Lower leg arterial blockage may result in calf pain.  As PVD becomes more severe, pain can develop with less physical activity.  In people with severe PVD, leg pain may occur at rest.  Other PVD signs and symptoms:  Leg numbness or weakness.  Coldness in the affected leg or foot,  especially when compared to the other leg.  A change in leg color.  Patients with significant PVD are more prone to ulcers or sores on toes, feet or legs. These may take longer to heal or may reoccur. The ulcers or sores can become infected.  If signs and symptoms of PVD are ignored, gangrene may occur. This can result in the loss of toes or loss of an entire limb.  Not all leg pain is related to PVD. Other medical conditions can cause leg pain such as:  Blood clots (embolism) or Deep Vein Thrombosis.  Inflammation of the blood vessels (vasculitis).  Spinal stenosis. DIAGNOSIS  Diagnosis of PVD can involve several different types of tests. These can include:  Pulse Volume Recording Method (PVR). This test is simple, painless and does not involve the use of X-rays. PVR involves measuring and comparing the blood pressure in the arms and legs. An ABI (Ankle-Brachial Index) is calculated. The normal ratio of blood pressures is 1. As this number becomes smaller, it indicates more severe disease.  < 0.95 - indicates significant narrowing in one or more leg vessels.  <0.8 - there will usually be pain in the foot, leg or buttock with exercise.  <0.4 - will usually have pain in the legs at rest.  <0.25 - usually indicates limb threatening PVD.  Doppler detection of pulses in the legs. This test is painless and checks to see  if you have a pulses in your legs/feet.  A dye or contrast material (a substance that highlights the blood vessels so they show up on x-ray) may be given to help your caregiver better see the arteries for the following tests. The dye is eliminated from your body by the kidney's. Your caregiver may order blood work to check your kidney function and other laboratory values before the following tests are performed:  Magnetic Resonance Angiography (MRA). An MRA is a picture study of the blood vessels and arteries. The MRA machine uses a large magnet to produce images of the  blood vessels.  Computed Tomography Angiography (CTA). A CTA is a specialized x-ray that looks at how the blood flows in your blood vessels. An IV may be inserted into your arm so contrast dye can be injected.  Angiogram. Is a procedure that uses x-rays to look at your blood vessels. This procedure is minimally invasive, meaning a small incision (cut) is made in your groin. A small tube (catheter) is then inserted into the artery of your groin. The catheter is guided to the blood vessel or artery your caregiver wants to examine. Contrast dye is injected into the catheter. X-rays are then taken of the blood vessel or artery. After the images are obtained, the catheter is taken out. TREATMENT  Treatment of PVD involves many interventions which may include:  Lifestyle changes:  Quitting smoking.  Exercise.  Following a low fat, low cholesterol diet.  Control of diabetes.  Foot care is very important to the PVD patient. Good foot care can help prevent infection.  Medication:  Cholesterol-lowering medicine.  Blood pressure medicine.  Anti-platelet drugs.  Certain medicines may reduce symptoms of Intermittent Claudication.  Interventional/Surgical options:  Angioplasty. An Angioplasty is a procedure that inflates a balloon in the blocked artery. This opens the blocked artery to improve blood flow.  Stent Implant. A wire mesh tube (stent) is placed in the artery. The stent expands and stays in place, allowing the artery to remain open.  Peripheral Bypass Surgery. This is a surgical procedure that reroutes the blood around a blocked artery to help improve blood flow. This type of procedure may be performed if Angioplasty or stent implants are not an option. SEEK IMMEDIATE MEDICAL CARE IF:   You develop pain or numbness in your arms or legs.  Your arm or leg turns cold, becomes blue in color.  You develop redness, warmth, swelling and pain in your arms or legs. MAKE SURE YOU:    Understand these instructions.  Will watch your condition.  Will get help right away if you are not doing well or get worse. Document Released: 10/25/2004 Document Revised: 12/10/2011 Document Reviewed: 09/21/2008 [facility] Patient Information 2015 [place], [place]. This information is not intended to replace advice given to you by your health care provider. Make sure you discuss any questions you have with your health care provider.   Stroke Prevention Some medical conditions and behaviors are associated with an increased chance of having a stroke. You may prevent a stroke by making healthy choices and managing medical conditions. HOW CAN I REDUCE MY RISK OF HAVING A STROKE?   Stay physically active. Get at least 30 minutes of activity on most or all days.  Do not smoke. It may also be helpful to avoid exposure to secondhand smoke.  Limit alcohol use. Moderate alcohol use is considered to be:  No more than 2 drinks per day for men.  No more than 1 drink  per day for nonpregnant women.  Eat healthy foods. This involves:  Eating 5 or more servings of fruits and vegetables a day.  Making dietary changes that address high blood pressure (hypertension), high cholesterol, diabetes, or obesity.  Manage your cholesterol levels.  Making food choices that are high in fiber and low in saturated fat, trans fat, and cholesterol may control cholesterol levels.  Take any prescribed medicines to control cholesterol as directed by your health care provider.  Manage your diabetes.  Controlling your carbohydrate and sugar intake is recommended to manage diabetes.  Take any prescribed medicines to control diabetes as directed by your health care provider.  Control your hypertension.  Making food choices that are low in salt (sodium), saturated fat, trans fat, and cholesterol is recommended to manage hypertension.  Take any prescribed medicines to control hypertension as directed by your  health care provider.  Maintain a healthy weight.  Reducing calorie intake and making food choices that are low in sodium, saturated fat, trans fat, and cholesterol are recommended to manage weight.  Stop drug abuse.  Avoid taking birth control pills.  Talk to your health care provider about the risks of taking birth control pills if you are over 40 years old, smoke, get migraines, or have ever had a blood clot.  Get evaluated for sleep disorders (sleep apnea).  Talk to your health care provider about getting a sleep evaluation if you snore a lot or have excessive sleepiness.  Take medicines only as directed by your health care provider.  For some people, aspirin or blood thinners (anticoagulants) are helpful in reducing the risk of forming abnormal blood clots that can lead to stroke. If you have the irregular heart rhythm of atrial fibrillation, you should be on a blood thinner unless there is a good reason you cannot take them.  Understand all your medicine instructions.  Make sure that other conditions (such as anemia or atherosclerosis) are addressed. SEEK IMMEDIATE MEDICAL CARE IF:   You have sudden weakness or numbness of the face, arm, or leg, especially on one side of the body.  Your face or eyelid droops to one side.  You have sudden confusion.  You have trouble speaking (aphasia) or understanding.  You have sudden trouble seeing in one or both eyes.  You have sudden trouble walking.  You have dizziness.  You have a loss of balance or coordination.  You have a sudden, severe headache with no known cause.  You have new chest pain or an irregular heartbeat. Any of these symptoms may represent a serious problem that is an emergency. Do not wait to see if the symptoms will go away. Get medical help at once. Call your local emergency services (911 in [place] Do not drive yourself to the hospital. Document Released: 10/25/2004 Document Revised: 02/01/2014 Document  Reviewed: 03/20/2013 [facility] Patient Information 2015 [place], [place]. This information is not intended to replace advice given to you by your health care provider. Make sure you discuss any questions you have with your health care provider.

## 2015-01-03 ENCOUNTER — Other Ambulatory Visit: Payer: Self-pay | Admitting: Internal Medicine

## 2015-01-03 ENCOUNTER — Other Ambulatory Visit: Payer: Self-pay | Admitting: Cardiology

## 2015-01-10 ENCOUNTER — Other Ambulatory Visit: Payer: Self-pay | Admitting: Cardiology

## 2015-01-11 ENCOUNTER — Telehealth: Payer: Self-pay | Admitting: *Deleted

## 2015-01-11 NOTE — Telephone Encounter (Signed)
The is a potential interaction between Multaq and prozac that may result in QT prolongation. Pt states he was on both medications when he had EKG in Nov 2015.  I have reviewed this information with Gay Filler, pharmacist and Dr Aundra Dubin, both have stated [facility] to continue both Multaq and prozac.   At the time of my last conservation with patient to give him this information pt's daughter got on the telephone and stated that pt just told her that he felt dizzy like before he had his TIA.  Pt's daughter states pt denies any other symptoms except dizziness.  I advised pt's daughter to take pt to [facility] ED for further evaluation . Pt's daughter agreed and said she felt the same way

## 2015-01-11 NOTE — Telephone Encounter (Signed)
Pt states that he is taking Multaq 400mg  bid regularly and has been taking it for several months.  Pt states he is tolerating it without problems and feels that it helps his atrial fibrillation.  Pt advised that I would refill Multaq 400mg  bid.

## 2015-01-11 NOTE — Telephone Encounter (Signed)
Dr Aundra Dubin received request for Multaq refill.   I spoke with patient and he states he has been taking Multaq 400mg  two times a day for several months.

## 2015-01-11 NOTE — Telephone Encounter (Signed)
I have been unable to contact pt by telephone numerous times to discuss Multaq prescription. I spoke with patient's wife and she said pt should be available at home number later this afternoon.

## 2015-01-11 NOTE — Telephone Encounter (Signed)
Not on patients med list. Per note patient re-started this on his own. Ok to refill? Please advise. Thanks, MI

## 2015-01-12 ENCOUNTER — Ambulatory Visit (INDEPENDENT_AMBULATORY_CARE_PROVIDER_SITE_OTHER): Payer: BLUE CROSS/BLUE SHIELD | Admitting: Internal Medicine

## 2015-01-12 ENCOUNTER — Ambulatory Visit (INDEPENDENT_AMBULATORY_CARE_PROVIDER_SITE_OTHER)
Admission: RE | Admit: 2015-01-12 | Discharge: 2015-01-12 | Disposition: A | Discharge status: Home / Self Care | Payer: BLUE CROSS/BLUE SHIELD | Source: Ambulatory Visit | Attending: Internal Medicine | Admitting: Internal Medicine

## 2015-01-12 ENCOUNTER — Encounter: Payer: Self-pay | Admitting: Internal Medicine

## 2015-01-12 ENCOUNTER — Other Ambulatory Visit (INDEPENDENT_AMBULATORY_CARE_PROVIDER_SITE_OTHER): Payer: BLUE CROSS/BLUE SHIELD

## 2015-01-12 ENCOUNTER — Other Ambulatory Visit: Payer: Self-pay | Admitting: Internal Medicine

## 2015-01-12 VITALS — BP 110/52 | HR 72 | Temp 97.8°F | Ht 73.0 in | Wt 200.5 lb

## 2015-01-12 DIAGNOSIS — J209 Acute bronchitis, unspecified: Secondary | ICD-10-CM

## 2015-01-12 DIAGNOSIS — D649 Anemia, unspecified: Secondary | ICD-10-CM

## 2015-01-12 DIAGNOSIS — I951 Orthostatic hypotension: Secondary | ICD-10-CM

## 2015-01-12 DIAGNOSIS — I6523 Occlusion and stenosis of bilateral carotid arteries: Secondary | ICD-10-CM

## 2015-01-12 DIAGNOSIS — J181 Lobar pneumonia, unspecified organism: Principal | ICD-10-CM

## 2015-01-12 DIAGNOSIS — J189 Pneumonia, unspecified organism: Secondary | ICD-10-CM

## 2015-01-12 LAB — CBC WITH DIFFERENTIAL/PLATELET
Basophils Absolute: 0 10*3/uL (ref 0.0–0.1)
Basophils Relative: 0.5 % (ref 0.0–3.0)
EOS PCT: 2.5 % (ref 0.0–5.0)
Eosinophils Absolute: 0.2 10*3/uL (ref 0.0–0.7)
HCT: 34.8 % — ABNORMAL LOW (ref 39.0–52.0)
HEMOGLOBIN: 11.8 g/dL — AB (ref 13.0–17.0)
LYMPHS ABS: 0.8 10*3/uL (ref 0.7–4.0)
Lymphocytes Relative: 12.4 % (ref 12.0–46.0)
MCHC: 33.9 g/dL (ref 30.0–36.0)
MCV: 85.1 fl (ref 78.0–100.0)
MONO ABS: 0.6 10*3/uL (ref 0.1–1.0)
Monocytes Relative: 8.6 % (ref 3.0–12.0)
NEUTROS ABS: 5.1 10*3/uL (ref 1.4–7.7)
Neutrophils Relative %: 76 % (ref 43.0–77.0)
PLATELETS: 286 10*3/uL (ref 150.0–400.0)
RBC: 4.09 Mil/uL — ABNORMAL LOW (ref 4.22–5.81)
RDW: 14.5 % (ref 11.5–15.5)
WBC: 6.7 10*3/uL (ref 4.0–10.5)

## 2015-01-12 MED ORDER — PREDNISONE 20 MG PO TABS: 20.0000 mg | ORAL_TABLET | Freq: Two times a day (BID) | ORAL | Status: DC

## 2015-01-12 NOTE — Progress Notes (Signed)
Pre visit review using our clinic review tool, if applicable. No additional management support is needed unless otherwise documented below in the visit note. 

## 2015-01-12 NOTE — Progress Notes (Signed)
   Subjective:    Patient ID: Troy [name], male    DOB: 1941-07-10, 74 y.o.   MRN: [id number]  HPI His symptoms began 01/08/15 as sore throat and rhinitis. He subsequently developed chest congestion. Initially sputum was clear but has become yellow as of 4/10. Delsym has been effective for the cough. He has used Cepacol for sore throat. He's had some shortness of breath and wheezing with the cough .He produces approximately 2 tablespoons of yellow sputum a day. There is scant yellow nasal discharge. He denies other upper respiratory tract infection symptoms.  He was seen at an urgent care 4/11 and doxycycline prescribed. He does feel he's better using this.   Yesterday in his office as he stood up; he became lightheaded and almost passed out. There was no frank syncope.  Review of Systems Frontal headache, facial pain , nasal purulence, dental pain, sore throat , otic pain or otic discharge denied. No fever , chills or sweats.     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  The nasal septum is deviated to the right.  Breath sounds are generally decreased especially at the bases.  He has diffuse keratotic skin lesions of the posterior trunk  General appearance:Adequately nourished; no acute distress or increased work of breathing is present.   Lymphatic: No  lymphadenopathy about the head, neck, or axilla . Eyes: No conjunctival inflammation or lid edema is present. There is no scleral icterus. Ears:  External ear exam shows no significant lesions or deformities.  Otoscopic examination reveals clear canals, tympanic membranes are intact bilaterally without bulging, retraction, inflammation or discharge. Nose:  External nasal examination shows no deformity or inflammation. Nasal mucosa are pink and moist without lesions or exudates. Oral exam: Dental hygiene is good; lips and gums are healthy appearing.There is no  oropharyngeal erythema or exudate . Neck:  No deformities, thyromegaly, masses, or tenderness noted.   Supple with full range of motion without pain.  Heart:  Normal rate and regular rhythm. S1 and S2 normal without gallop, murmur, click, rub or other extra sounds. Lungs: no wheezes, rhonchi,rales ,or rubs present. Extremities:  No cyanosis, edema, or clubbing  noted  Skin: Warm & dry w/o tenting or jaundice. No significant lesions or rash.      Assessment & Plan:   #1 acute bronchitis, improvement with the doxycycline  #2 advanced COPD    #3  Postural hypotension  Plan: See orders and recommendations

## 2015-01-12 NOTE — Patient Instructions (Signed)
Perform isometric exercise of calves  ( while seated go up on toes to count of 5 & then onto heels for 5 count). Repeat  4- 5 times prior to standing if you've been seated or supine for any significant period of time as BP drops with such positions.    Your next office appointment will be determined based upon review of your pending labs & xrays Those instructions will be transmitted to you by My Chart Critical results will be called.  Followup as needed for any active or acute issue. Please report any significant change in your symptoms.

## 2015-01-17 ENCOUNTER — Other Ambulatory Visit: Payer: Self-pay | Admitting: Cardiology

## 2015-01-19 ENCOUNTER — Telehealth: Payer: Self-pay | Admitting: Internal Medicine

## 2015-01-19 NOTE — Telephone Encounter (Signed)
Phone call to patient. He has been advised of his labs and xray results and to return 3-4 days after completing the antibiotic for additional labs and another xray

## 2015-01-19 NOTE — Telephone Encounter (Signed)
Pt called in and would like call back from nurse about results of lab and xrays

## 2015-01-25 ENCOUNTER — Other Ambulatory Visit (HOSPITAL_COMMUNITY): Payer: BC Managed Care – PPO

## 2015-01-25 ENCOUNTER — Ambulatory Visit: Payer: BC Managed Care – PPO | Admitting: Family

## 2015-01-26 ENCOUNTER — Other Ambulatory Visit (HOSPITAL_COMMUNITY): Payer: BC Managed Care – PPO

## 2015-01-26 ENCOUNTER — Encounter (HOSPITAL_COMMUNITY): Payer: BC Managed Care – PPO

## 2015-01-26 ENCOUNTER — Ambulatory Visit: Payer: BC Managed Care – PPO | Admitting: Family

## 2015-01-27 ENCOUNTER — Telehealth: Payer: Self-pay | Admitting: Internal Medicine

## 2015-01-27 NOTE — Telephone Encounter (Signed)
Dr Linna Darner,   Is patient just to have cbc and ibc panel?

## 2015-01-27 NOTE — Telephone Encounter (Signed)
States patient is to have another chest x ray 2-3 days after he finishes antibiotic and he is to have lab work.  Would like to know if orders can be entered.

## 2015-01-27 NOTE — Telephone Encounter (Signed)
Orders in for tomorrow 4/29

## 2015-01-27 NOTE — Telephone Encounter (Signed)
advised

## 2015-01-31 ENCOUNTER — Other Ambulatory Visit: Payer: Self-pay | Admitting: Internal Medicine

## 2015-01-31 ENCOUNTER — Ambulatory Visit (INDEPENDENT_AMBULATORY_CARE_PROVIDER_SITE_OTHER)
Admission: RE | Admit: 2015-01-31 | Discharge: 2015-01-31 | Disposition: A | Discharge status: Home / Self Care | Payer: BLUE CROSS/BLUE SHIELD | Source: Ambulatory Visit | Attending: Internal Medicine | Admitting: Internal Medicine

## 2015-01-31 DIAGNOSIS — J90 Pleural effusion, not elsewhere classified: Secondary | ICD-10-CM

## 2015-01-31 DIAGNOSIS — J189 Pneumonia, unspecified organism: Secondary | ICD-10-CM

## 2015-01-31 DIAGNOSIS — R918 Other nonspecific abnormal finding of lung field: Secondary | ICD-10-CM

## 2015-01-31 DIAGNOSIS — C3491 Malignant neoplasm of unspecified part of right bronchus or lung: Secondary | ICD-10-CM

## 2015-02-02 ENCOUNTER — Telehealth: Payer: Self-pay | Admitting: Internal Medicine

## 2015-02-02 NOTE — Telephone Encounter (Signed)
Troy [name], I am concerned that he has recurrent lung cancer on the right with associated effusion. 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 Thanks very much for the consideration. [employer]

## 2015-02-02 NOTE — Telephone Encounter (Signed)
Patient was referred to Dr. Melvyn Novas and they can't get her in till 5/23. They told him that if Dr. Linna Darner would call into Dr. Morrison Old office they may be able to work him in. Please call patient.

## 2015-02-03 ENCOUNTER — Ambulatory Visit (INDEPENDENT_AMBULATORY_CARE_PROVIDER_SITE_OTHER): Payer: BLUE CROSS/BLUE SHIELD | Admitting: Internal Medicine

## 2015-02-03 ENCOUNTER — Ambulatory Visit: Payer: BLUE CROSS/BLUE SHIELD | Admitting: Internal Medicine

## 2015-02-03 ENCOUNTER — Institutional Professional Consult (permissible substitution): Payer: BLUE CROSS/BLUE SHIELD | Admitting: Internal Medicine

## 2015-02-03 ENCOUNTER — Encounter: Payer: Self-pay | Admitting: Internal Medicine

## 2015-02-03 VITALS — BP 148/76 | HR 69 | Ht 73.0 in | Wt 197.0 lb

## 2015-02-03 DIAGNOSIS — C3491 Malignant neoplasm of unspecified part of right bronchus or lung: Secondary | ICD-10-CM

## 2015-02-03 DIAGNOSIS — R0600 Dyspnea, unspecified: Secondary | ICD-10-CM | POA: Diagnosis not present

## 2015-02-03 DIAGNOSIS — I6523 Occlusion and stenosis of bilateral carotid arteries: Secondary | ICD-10-CM

## 2015-02-03 DIAGNOSIS — J439 Emphysema, unspecified: Secondary | ICD-10-CM

## 2015-02-03 NOTE — Patient Instructions (Signed)
I will let Dr Linna Darner know that bronchoscopy is not needed in this particular point - I would wait at 3 more weeks before the CT at [facility] and based those results I would be happy to look at the scan results and give you a call   Most likely you feel tired for other reasons and I am concerned about the high doses of crestor and the use of cardura and altace

## 2015-02-03 NOTE — Telephone Encounter (Signed)
Called and spoke to pt's wife. Appt made with MW today (02/03/15) at 4pm. Pt verbalized understanding and denied any further questions or concerns at this time.

## 2015-02-03 NOTE — Telephone Encounter (Signed)
Ov ok to add on at end of day

## 2015-02-03 NOTE — Progress Notes (Signed)
Subjective:     Patient ID: Troy Kenner [name], male   DOB: [birth year],     MRN: [id number]  HPI   46 yowm quit smoking Oct 2014 s/p RLLobecty 2012 at [facility] with Ib Sq cell ca and no endobronchial dz at that time and with no RT / chemo being followed by [facility] [place] with last ct chest done in 03/2014 showing persistent moderate R effusion  due for f/u 04/2015 with new onset gen persistent   fatigue winter of 2015 then very abruptly ill 01/08/15 sore throat, cough congestion yellow mucus, light headed rx doxy / pred and referrred to pulmonary clninic 02/03/2015 by Dr Linna Darner.   02/03/2015 1st [facility] office visit in [place] / [place]   Chief Complaint  Patient presents with  . Advice Only    SOB w/activity; dizzy; fatigue;   doe x more difficult to go up one flight of steps since acute illness,   Still able to cross parking lot but wife says has to sit down when shops now if walks more than 45 min.  All of his acute symptoms otherwise resolved   No obvious day to day or daytime variabilty or assoc chronic cough or cp or chest tightness, subjective wheeze overt sinus or hb symptoms. No unusual exp hx or h/o childhood pna/ asthma or knowledge of premature birth.  Sleeping ok without nocturnal  or early am exacerbation  of respiratory  c/o's or need for noct saba. Also denies any obvious fluctuation of symptoms with weather or environmental changes or other aggravating or alleviating factors except as outlined above   Current Medications, Allergies, Complete Past Medical History, Past Surgical History, Family History, and Social History were reviewed in [employer] record.  ROS  The following are not active complaints unless bolded sore throat, dysphagia, dental problems, itching, sneezing,  nasal congestion or excess/ purulent secretions, ear ache,   fever, chills, sweats, unintended wt loss, pleuritic or exertional cp, hemoptysis,  orthopnea pnd or leg swelling, presyncope,  palpitations, heartburn, abdominal pain, anorexia, nausea, vomiting, diarrhea  or change in bowel or urinary habits, change in stools or urine, dysuria,hematuria,  rash, arthralgias, visual complaints, headache, numbness weakness or ataxia or problems with walking or coordination,  change in mood/affect or memory.             Review of Systems     Objective:   Physical Exam    amb robust wm nad constantly bickering with his wife over how sick he is  Wt Readings from Last 3 Encounters:  02/03/15 197 lb (89.359 kg)  01/12/15 200 lb 8 oz (90.946 kg)  12/28/14 200 lb (90.719 kg)    Vital signs reviewed  HEENT: nl dentition, turbinates, and orophanx. Nl external ear canals without cough reflex   NECK :  without JVD/Nodes/TM/ nl carotid upstrokes bilaterally   LUNGS: no acc muscle use, minimal decrease bs on R no localized rhonchi or wheezing    CV:  RRR  no s3 or murmur or increase in P2, no edema   ABD:  soft and nontender with nl excursion in the supine position. No bruits or organomegaly, bowel sounds nl  MS:  warm without deformities, calf tenderness, cyanosis or clubbing  SKIN: warm and dry without lesions    NEURO:  alert, approp, no deficits    I personally reviewed images and agree with radiology impression as follows:  CXR:  01/31/15 Persistent right base pulmonary infiltrate with right pleural effusion.  No interim change from prior exam.    Assessment:

## 2015-02-04 ENCOUNTER — Telehealth: Payer: Self-pay | Admitting: Internal Medicine

## 2015-02-04 ENCOUNTER — Telehealth: Payer: Self-pay | Admitting: Cardiology

## 2015-02-04 ENCOUNTER — Encounter: Payer: Self-pay | Admitting: Internal Medicine

## 2015-02-04 DIAGNOSIS — R5382 Chronic fatigue, unspecified: Secondary | ICD-10-CM

## 2015-02-04 DIAGNOSIS — R0609 Other forms of dyspnea: Secondary | ICD-10-CM | POA: Insufficient documentation

## 2015-02-04 NOTE — Telephone Encounter (Signed)
Patient states he has stopped taking cardura, crestor and ramipril after speaking with Dr. Melvyn Novas yesterday.  Patient was wanting a call in regards but I did schedule for patient to come in for an OV Monday with Dr. Linna Darner.  FYI

## 2015-02-04 NOTE — Telephone Encounter (Signed)
Completing phone call notes from below as computer locked.  Patient has also stopped his Multaq (along with his Ramipril) because it is unsure if this is contributing to his fatigue. He states that  Educated patient on medications and their S.E.  Patient states he will consider restarting the Multaq to see if his fatigue returns just being on Multaq alone. Patient would like Dr. Claris Gladden advisement regarding his options that will limit the side effects of fatigue.  He also just wanted to fyi [name] that he was referred to Dr. Melvyn Novas, pulmonology recently by Dr. Linna Darner.

## 2015-02-04 NOTE — Telephone Encounter (Signed)
Patient states he has been experiencing more than moderate daily fatigue and weakness and tiredness. He states he has also noticed some vision problems of not seeing quite so clear as usual. He attributes this to the Ramipril. He has stopped taking the Ramipril, as of 5/5. He will monitor his BP daily, so as to make sure he knows to call if it increases.  He has also stopped his Multaq

## 2015-02-04 NOTE — Assessment & Plan Note (Signed)
-    02/03/2015  Walked RA x 3 laps @ 185 ft each stopped due to min sob brisk pace, L hip pain, no desat   Symptoms are markedly disproportionate to objective findings and not clear this is a lung problem but pt does appear to have difficult airway management issues. DDX of  difficult airways management all start with A and  include Adherence, Ace Inhibitors, Acid Reflux, Active Sinus Disease, Alpha 1 Antitripsin deficiency, Anxiety masquerading as Airways dz,  ABPA,  allergy(esp in young), Aspiration (esp in elderly), Adverse effects of Drugs,  Active smokers, plus two Bs  = Bronchiectasis and Beta blocker use..and one C= CHF   Adherence is always the initial "prime suspect" and is a multilayered concern that requires a "trust but verify" approach in every patient - starting with knowing how to use medications, especially inhalers, correctly, keeping up with refills and understanding the fundamental difference between maintenance and prns vs those medications only taken for a very short course and then stopped and not refilled.   ? ACEi effects > no assoc chronic cough but in my experience may patients on ACEi with new unexplained sob get better off ACei and I have a collection of thank you notes to prove it.  May need to consider a trial off despite absent cough hx -  Some of his chronic fatigue / muscle aches may be related to high doses of crestor, would also consider a trial off - his c/o presyncope standing could be the cardura    ? Anxiety/ depression > usually a dx of exclusion > defer eval and rx to primary care

## 2015-02-04 NOTE — Assessment & Plan Note (Addendum)
  01/24/11 RLLobectomy Eighty Four Sq Cell 1B  Evaluated by Dr Darlyne Russian radiation or chemotherapy  No evidence of recurrence on present set of films - he has a chronic moderate effusion by CT chest ( though much better now on our films since last avail comparison pa and lat 10/14 2014)  but no RLL so he can't have RLL collapse as suggested by radiology and he had no endobronchial dz at initial w/u.   He is due his yearly CT in July at [facility] and would simply rec wait 3 weeks (which would be 6 weeks from his acute resp illness onset which could well have been a bronchopna) and do the ct at [facility] then and if he wants me to take another look at that point I'd be happy to schedule him s return ov  Discussed in detail all the  indications, usual  risks and alternatives  relative to the benefits with patient who agrees to proceed with plan as outlined though his wife went along with it grudgingly is convinced the pt is not being forthright about his symptoms (see dyspnea)

## 2015-02-04 NOTE — Telephone Encounter (Signed)
New Message   Pt c/o medication issue:  1. Name of Medication: Ramitril, multaq  2. How are you currently taking this medication (dosage and times per day)? Daily/ 2 1/2 mg/ 430m twice daily   3. Are you having a reaction (difficulty breathing--STAT)? No energy tired weak, lightheadness   4. What is your medication issue? Feels like the pills are going to kill him,

## 2015-02-04 NOTE — Addendum Note (Signed)
Addended by: Toni Amend D on: 02/04/2015 04:13 PM   Modules accepted: Orders, Medications

## 2015-02-04 NOTE — Telephone Encounter (Signed)
Ramipril discontinued from Medication List, however Multaq remains since patient may start it back this week.

## 2015-02-05 NOTE — Telephone Encounter (Signed)
OK to stop ramipril, check BP daily off ramipril and call in with readings.  Ramipril probably more likely to cause fatigue than Multaq.   Would consider restarting Multaq after a few days to see if he can take that alone without problems.  If fatigue goes away off ramipril and Multaq and returns on Multaq, then fatigue may be related to [facility] and he could stop that.   Given fatigue, would get echocardiogram to make sure cardiac function remains normal.

## 2015-02-07 ENCOUNTER — Ambulatory Visit (INDEPENDENT_AMBULATORY_CARE_PROVIDER_SITE_OTHER): Payer: BLUE CROSS/BLUE SHIELD | Admitting: Internal Medicine

## 2015-02-07 VITALS — BP 150/80 | HR 74 | Temp 97.8°F | Resp 16 | Ht 73.0 in | Wt 194.0 lb

## 2015-02-07 DIAGNOSIS — E782 Mixed hyperlipidemia: Secondary | ICD-10-CM | POA: Diagnosis not present

## 2015-02-07 DIAGNOSIS — D649 Anemia, unspecified: Secondary | ICD-10-CM | POA: Diagnosis not present

## 2015-02-07 DIAGNOSIS — R42 Dizziness and giddiness: Secondary | ICD-10-CM

## 2015-02-07 DIAGNOSIS — I6523 Occlusion and stenosis of bilateral carotid arteries: Secondary | ICD-10-CM

## 2015-02-07 NOTE — Telephone Encounter (Signed)
Left message for patient to call back  

## 2015-02-07 NOTE — Progress Notes (Signed)
   Subjective:    Patient ID: Troy Kenner [name], male    DOB: [birth year], 74 y.o.   MRN: [id number]  HPI He had he had interrupt his three-day golf match the first two days because he was "woozy and dizzy" and felt as if he would faint. The temperature was in the 70s & 80s. His golf partners included Pharmacists  Who expressed the opinion that this is most likely related to his medications. He stopped his Crestor, Cardura, and ramipril and "was able to play golf the 3rd day". He's been off these for @ least 7 days.  He continues to have claudication; he is on Pletal. He is also on Eliquis. No bleeding dyscrasias present. He has had a mild anemia.  He's had no lipid assessment since April 2015.  Review of Systems His exertional dyspnea is unchanged. He will have a follow-up CT scan [facility] as part of his lung cancer monitor. He denies exertional chest pain, palpitations, edema,paroxysmal nocturnal dyspnea. Epistaxis, hemoptysis, hematuria, melena, or rectal bleeding denied. No unexplained weight loss, significant dyspepsia,dysphagia, or abdominal pain.  There is no abnormal bruising , bleeding, or difficulty stopping bleeding with injury.    Objective:   Physical Exam  Pertinent or positive findings include:  He actually appears very stable this time.  Complexion is robust without his usual pallor.  He is alert and interactive.  Breath sounds are absent  In the right lower lobe.  There is no increased work of breathing.  All pedal pulses are essentially nonpalpable.  There is no pitting edema.  General appearance :adequately nourished; in no distress. Eyes: No conjunctival inflammation or scleral icterus is present. Oral exam:  Lips and gums are healthy appearing.There is no oropharyngeal erythema or exudate noted. Dental hygiene is good. Heart:  Normal rate and regular rhythm. S1 and S2 normal without gallop, murmur, click, rub or other extra sounds  Lungs:Chest clear to auscultation; no  wheezes, rhonchi,rales ,or rubs present.No increased work of breathing.  Abdomen: bowel sounds normal, soft and non-tender without masses, organomegaly or hernias noted.  No guarding or rebound.  Vascular : all pulses equal ; no bruits present. Skin:Warm & dry.  Intact without suspicious lesions or rashes ; no tenting or jaundice  Lymphatic: No lymphadenopathy is noted about the head, neck, axilla Neuro: Strength, tone & DTRs normal.        Assessment & Plan:  #1 dizziness and near syncope. This is more likely related to the increased exertional demands and ambient heat exposure in the context of his advanced vasculopathy rather than the medicines.  Plan: Labs will be checked in the morning and communication made with his Cardiologist as to appropriate regimen.

## 2015-02-07 NOTE — Progress Notes (Signed)
Pre visit review using our clinic review tool, if applicable. No additional management support is needed unless otherwise documented below in the visit note. 

## 2015-02-07 NOTE — Patient Instructions (Signed)
  Your next office appointment will be determined based upon review of your pending labs.  Those instructions will be transmitted to you by My Chart    Critical results will be called.   Followup as needed for any active or acute issue. Please report any significant change in your symptoms. 

## 2015-02-08 ENCOUNTER — Encounter: Payer: Self-pay | Admitting: Internal Medicine

## 2015-02-08 ENCOUNTER — Other Ambulatory Visit (INDEPENDENT_AMBULATORY_CARE_PROVIDER_SITE_OTHER): Payer: BLUE CROSS/BLUE SHIELD

## 2015-02-08 DIAGNOSIS — D649 Anemia, unspecified: Secondary | ICD-10-CM

## 2015-02-08 DIAGNOSIS — R42 Dizziness and giddiness: Secondary | ICD-10-CM | POA: Diagnosis not present

## 2015-02-08 DIAGNOSIS — E782 Mixed hyperlipidemia: Secondary | ICD-10-CM

## 2015-02-08 LAB — BASIC METABOLIC PANEL
BUN: 20 mg/dL (ref 6–23)
CHLORIDE: 106 meq/L (ref 96–112)
CO2: 27 mEq/L (ref 19–32)
Calcium: 9.6 mg/dL (ref 8.4–10.5)
Creatinine, Ser: 1.12 mg/dL (ref 0.40–1.50)
GFR: 68.05 mL/min (ref >60.00)
Glucose, Bld: 115 mg/dL — ABNORMAL HIGH (ref 70–99)
POTASSIUM: 4.2 meq/L (ref 3.5–5.1)
SODIUM: 139 meq/L (ref 135–145)

## 2015-02-08 LAB — HEPATIC FUNCTION PANEL
ALT: 18 U/L (ref 0–53)
AST: 17 U/L (ref 0–37)
Albumin: 3.8 g/dL (ref 3.5–5.2)
Alkaline Phosphatase: 67 U/L (ref 39–117)
BILIRUBIN TOTAL: 0.5 mg/dL (ref 0.2–1.2)
Bilirubin, Direct: 0.1 mg/dL (ref 0.0–0.3)
Total Protein: 7.4 g/dL (ref 6.0–8.3)

## 2015-02-08 LAB — TSH: TSH: 1.24 u[IU]/mL (ref 0.35–4.50)

## 2015-02-08 LAB — CBC WITH DIFFERENTIAL/PLATELET
BASOS ABS: 0 10*3/uL (ref 0.0–0.1)
Basophils Relative: 0.6 % (ref 0.0–3.0)
EOS ABS: 0.2 10*3/uL (ref 0.0–0.7)
Eosinophils Relative: 3.1 % (ref 0.0–5.0)
HCT: 39.5 % (ref 39.0–52.0)
Hemoglobin: 13.4 g/dL (ref 13.0–17.0)
Lymphocytes Relative: 17.4 % (ref 12.0–46.0)
Lymphs Abs: 1.2 10*3/uL (ref 0.7–4.0)
MCHC: 33.8 g/dL (ref 30.0–36.0)
MCV: 85.2 fl (ref 78.0–100.0)
MONO ABS: 0.6 10*3/uL (ref 0.1–1.0)
Monocytes Relative: 9.4 % (ref 3.0–12.0)
NEUTROS PCT: 69.5 % (ref 43.0–77.0)
Neutro Abs: 4.7 10*3/uL (ref 1.4–7.7)
PLATELETS: 293 10*3/uL (ref 150.0–400.0)
RBC: 4.64 Mil/uL (ref 4.22–5.81)
RDW: 15.1 % (ref 11.5–15.5)
WBC: 6.8 10*3/uL (ref 4.0–10.5)

## 2015-02-08 LAB — LIPID PANEL
CHOLESTEROL: 149 mg/dL (ref 0–200)
HDL: 42 mg/dL (ref >39.00)
LDL CALC: 86 mg/dL (ref 0–99)
NonHDL: 107
TRIGLYCERIDES: 106 mg/dL (ref 0.0–149.0)
Total CHOL/HDL Ratio: 4
VLDL: 21.2 mg/dL (ref 0.0–40.0)

## 2015-02-08 LAB — FERRITIN: FERRITIN: 92.8 ng/mL (ref 22.0–322.0)

## 2015-02-08 LAB — CK: Total CK: 41 U/L (ref 7–232)

## 2015-02-08 NOTE — Telephone Encounter (Signed)
LMTCB for pt 

## 2015-02-08 NOTE — Addendum Note (Signed)
Addended by: Katrine Coho on: 02/08/2015 01:58 PM   Modules accepted: Orders

## 2015-02-08 NOTE — Telephone Encounter (Signed)
Mw are you okay with patient canceling his appt? Thanks.

## 2015-02-08 NOTE — Telephone Encounter (Signed)
Pt states he is not taking ramipril and restarted Multaq '400mg'$  bid yesterday. Pt given an appt to see Dr Aundra Dubin 02/17/15 and agreed to schedule echocardiogram prior to that appt.

## 2015-02-10 ENCOUNTER — Telehealth: Payer: Self-pay

## 2015-02-10 ENCOUNTER — Other Ambulatory Visit (INDEPENDENT_AMBULATORY_CARE_PROVIDER_SITE_OTHER): Payer: BLUE CROSS/BLUE SHIELD

## 2015-02-10 DIAGNOSIS — R739 Hyperglycemia, unspecified: Secondary | ICD-10-CM | POA: Diagnosis not present

## 2015-02-10 LAB — HEMOGLOBIN A1C: HEMOGLOBIN A1C: 6.1 % (ref 4.6–6.5)

## 2015-02-10 NOTE — Telephone Encounter (Signed)
-----   Message from Hendricks Limes, MD sent at 02/10/2015  8:31 AM EDT ----- Please add A1c (R73.9)

## 2015-02-10 NOTE — Telephone Encounter (Signed)
Request for add on has been faxed to lab 

## 2015-02-15 ENCOUNTER — Other Ambulatory Visit: Payer: Self-pay

## 2015-02-15 ENCOUNTER — Ambulatory Visit (HOSPITAL_COMMUNITY): Payer: BLUE CROSS/BLUE SHIELD | Attending: Cardiovascular Disease

## 2015-02-15 DIAGNOSIS — I10 Essential (primary) hypertension: Secondary | ICD-10-CM

## 2015-02-15 DIAGNOSIS — R5382 Chronic fatigue, unspecified: Secondary | ICD-10-CM | POA: Diagnosis not present

## 2015-02-16 ENCOUNTER — Other Ambulatory Visit: Payer: Self-pay | Admitting: Internal Medicine

## 2015-02-16 ENCOUNTER — Other Ambulatory Visit: Payer: Self-pay | Admitting: Cardiology

## 2015-02-17 ENCOUNTER — Encounter: Payer: Self-pay | Admitting: Cardiology

## 2015-02-17 ENCOUNTER — Ambulatory Visit (INDEPENDENT_AMBULATORY_CARE_PROVIDER_SITE_OTHER): Payer: BLUE CROSS/BLUE SHIELD | Admitting: Cardiology

## 2015-02-17 ENCOUNTER — Telehealth: Payer: Self-pay | Admitting: Cardiology

## 2015-02-17 VITALS — BP 146/58 | HR 70 | Ht 73.0 in | Wt 199.0 lb

## 2015-02-17 DIAGNOSIS — I6523 Occlusion and stenosis of bilateral carotid arteries: Secondary | ICD-10-CM

## 2015-02-17 DIAGNOSIS — I480 Paroxysmal atrial fibrillation: Secondary | ICD-10-CM | POA: Diagnosis not present

## 2015-02-17 DIAGNOSIS — E782 Mixed hyperlipidemia: Secondary | ICD-10-CM | POA: Diagnosis not present

## 2015-02-17 DIAGNOSIS — I739 Peripheral vascular disease, unspecified: Secondary | ICD-10-CM

## 2015-02-17 DIAGNOSIS — R0989 Other specified symptoms and signs involving the circulatory and respiratory systems: Secondary | ICD-10-CM

## 2015-02-17 NOTE — Telephone Encounter (Signed)
New Message  Pt wife calling to inform [name] of following med list. Per pt wife- no refills on these medications. Pt forgot this list and is on the way to appt.   Diltizem- 5 mg Multaq- 400 mg  Cilostazol- 100 mg

## 2015-02-17 NOTE — Patient Instructions (Signed)
Medication Instructions:  No changes today  Labwork: None today  Testing/Procedures: None today  Follow-Up: Your physician recommends that you schedule a follow-up appointment in: 4 months with Dr Aundra Dubin.

## 2015-02-17 NOTE — Telephone Encounter (Signed)
noted 

## 2015-02-18 NOTE — Progress Notes (Signed)
Patient ID: Troy Kenner [name], male   DOB: 1941/06/16, 74 y.o.   MRN: [id number] PCP: Dr. Linna Darner  74 yo with history of HTN, COPD, active smoking/COPD, carotid stenosis s/p right CEA, and PAD presents for cardiology followup.  He does not have known CAD but is at high risk for CAD based on his comorbidities.  Lexiscan Cardiolite in 3/14 showed no ischemia or infarction and echo in 3/14 showed normal EF.  He had a left fem-pop bypass at the [facility] in the '90s.  He is followed at [facility] for PAD, last ABIs in 3/16 showed mild disease on the left and moderate disease on the right (unchanged). He has chronic right calf/thigh/buttocks claudication that is unchanged over the last few years and follows regularly at [facility].  He has had right lower lobectomy for lung cancer.  He continues to stay off cigarettes.    At a prior appointment, he was in atrial fibrillation but did not realize it.  I started him on Eliquis and diltiazem CD, and he spontaneously converted to NSR.  In 5/15, he was at [facility] and felt "strange" one day: fatigued, weak, short of breath.  He went to the ER and was in atrial fibrillation with HR in 80s-90s.  He spontaneously converted to NSR in the ER.  He felt back to normal after converting to NSR.  I started him on Multaq 400 mg bid.  He seems to have remained in NSR since then.    Given generalized fatigue, he stopped ramipril.  This led to considerable improvement in his fatigue.  He denies dyspnea on flat ground.  He is short of breath walking up hills.  No chest pain.  He does not have claudication walking on flat ground but does have pain in the right calf with walking 1-2 blocks up an incline.  No syncope or lightheadedness.   Labs (3/13): LDL 75, HDL 35, K 4.1, creatinine 1.1 Labs (3/14): LDL 91, HDL 27 Labs (10/14): K 4.3, creatinine 0.98 Labs (4/15): K 4.9, creatinine 1.1, LDL 76, HDL 30 Labs (5/15): K 4.3, creatinine 1.7, BNP 251 Labs (6/15): K 4.4, creatinine 1.3 Labs (10/15):  TSH normal Labs (5/16): K 4.2, creatinine 1.12, HCT 39.5, LFTs normal, LDL 84, HDL 43, TSH normal  ECG: NSR, PACs, QTc normal  PMH: 1. HTN: Fatigue and cough with ramipril use.  2. COPD: Quit smoking 2014.  3. AAA: CT 1/13 with 3.0 cm AAA.  Abdominal US (1/14) with 3.4 cm AAA. Abdominal US (4/15) with 3.25 x 3.27 AAA. Abdominal US (3/16) with 3.7 cm AAA.   4. Squamous cell lung cancer diagnosed 2/12.  Had right lower lobectomy in 4/12.  5. Hyperlipidemia 6. PAD: Left fem-pop bypass 1994.  ABIs (2/12) 0.62 on right, 0.95 on left. ABIs (1/14): 0.65 on right, 1.04 on left. ABIs (4/15) 0.66 right, 0.87 left.  ABIs (3/16) 0.6 right, 0.88 left.  7. Stress myoview 2004 was normal. Lexiscan Cardiolite (3/14) with EF 66%, no ischemia or infarction.  8. Echo (3/14): technically difficult with EF 55-60%, upper normal RV size.  Echo (5/16) with EF 60-65%.   9. Carotid stenosis: TIA 10/14.  Carotid dopplers with > 70% RICA, 35-00% LICA.  Patient had right CEA in 10/14. Carotids (4/15) patent right CEA, LICA 93-81% stenosis.  Carotids (82/99) < 37% LICA, patent right CEA.  10. Atrial fibrillation: Paroxysmal. First noted after lobectomy in 4/12 (brief), recurrence in 4/15 then in 5/15.  11. Spinal stenosis  SH: Married,  lives in [place].  [place].  Quit smoking in 2014.   FH: No premature CAD  ROS: All systems reviewed and negative except as per HPI.   Current Outpatient Prescriptions  Medication Sig Dispense Refill  . bimatoprost (LUMIGAN) 0.03 % ophthalmic solution Place 1 drop into both eyes at bedtime.    [name] BIOTIN PO Take 5,000 mcg by mouth daily.    . Bisacodyl (DULCOLAX PO) Take by mouth as needed.    . brimonidine (ALPHAGAN P) 0.1 % SOLN Place 3 drops into the left eye 3 (three) times daily.     [name] CARDURA 4 MG tablet TAKE (1/2) TABLET DAILY. 45 tablet 0  . cilostazol (PLETAL) 100 MG tablet Take 1 tablet (100 mg total) by mouth 2 (two) times daily. 180 tablet 1  .  CRESTOR 40 MG tablet TAKE 1 TABLET ONCE DAILY. 90 tablet 0  . diltiazem (CARDIZEM CD) 120 MG 24 hr capsule TAKE 1 CAPSULE DAILY. 30 capsule 3  . dorzolamide-timolol (COSOPT) 22.3-6.8 MG/ML ophthalmic solution Place 2 drops into the right eye 2 (two) times daily.    [name] ELIQUIS 5 MG TABS tablet TAKE 1 TABLET TWICE DAILY. 60 tablet 3  . FLUoxetine (PROZAC) 20 MG tablet Take 1 tablet (20 mg total) by mouth daily. 30 tablet 2  . Ginkgo Biloba 40 MG TABS Take by mouth. daily    . MELATONIN PO Take 3 mg by mouth at bedtime as needed (sleep).     . MULTAQ 400 MG tablet TAKE 1 TABLET TWICE DAILY WITH A MEAL. 60 tablet 3  . Multiple Vitamins-Minerals (CENTRUM SILVER PO) Take 1 tablet by mouth daily.    . ranitidine (ZANTAC) 150 MG tablet Take 150 mg by mouth as needed for heartburn.     No current facility-administered medications for this visit.    BP 146/58 mmHg  Pulse 70  Ht '6\' 1"'$  (1.854 m)  Wt 199 lb (90.266 kg)  BMI 26.26 kg/m2 General: NAD Neck: JVP 7 cm, no thyromegaly or thyroid nodule.  Lungs: Mildly decreased breath sounds right base.  CV: Nondisplaced PMI.  Heart regular S1/S2, no S3/S4, 1/6 SEM RUSB.  No edema.  No carotid bruit. Right foot is warm but unable to palpate PT pulse.   Abdomen: Soft, nontender, no hepatosplenomegaly, no distention.   Neurologic: Alert and oriented x 3.  Psych: Normal affect. Extremities: No clubbing or cyanosis.   Assessment/Plan: 1. Chest pain: He has had no further chest pain.  Lexiscan Cardiolite was negative in 3/14 and EF was preserved on echo.   2. Hyperlipidemia: Lipids ok in 5/16.  He will continue Crestor 40 daily.   3. Carotid stenosis: s/p R CEA.  Will follow with [facility] for residual LICA stenosis.  4. PAD: Stable but significant claudication.  No foot ulcerations or rest pain.  Followed at [facility]. He is on cilostazol. He knows to try to walk through the pain for at least a short distance.   5. AAA: Stable on recent US.    6. COPD: Dyspnea  improved with Spiriva and now that he has stopped smoking.   7. Atrial fibrillation: Patient remains in NSR on dronedarone.  CHADSVASC 3 (age, vascular disease, HTN).  He is also on diltiazem CD 120 mg daily.   8. CKD: Recent creatinine looks ok.  9. Fatigue: Much improved off ramipril.  He will stay off this.  I asked him to check his BP several times a week and to call me  if it is running consistently > 140/90.    [name] 02/18/2015

## 2015-02-21 ENCOUNTER — Institutional Professional Consult (permissible substitution): Payer: BLUE CROSS/BLUE SHIELD | Admitting: Internal Medicine

## 2015-03-04 ENCOUNTER — Encounter: Payer: Self-pay | Admitting: Internal Medicine

## 2015-03-14 ENCOUNTER — Other Ambulatory Visit (HOSPITAL_COMMUNITY): Payer: BLUE CROSS/BLUE SHIELD

## 2015-03-28 ENCOUNTER — Other Ambulatory Visit: Payer: Self-pay

## 2015-04-20 ENCOUNTER — Other Ambulatory Visit: Payer: Self-pay | Admitting: Internal Medicine

## 2015-04-21 ENCOUNTER — Other Ambulatory Visit: Payer: Self-pay | Admitting: Emergency Medicine

## 2015-04-21 MED ORDER — DOXAZOSIN MESYLATE 4 MG PO TABS: ORAL_TABLET | ORAL | Status: DC

## 2015-05-18 ENCOUNTER — Other Ambulatory Visit: Payer: Self-pay | Admitting: Cardiology

## 2015-05-19 ENCOUNTER — Telehealth: Payer: Self-pay | Admitting: Cardiology

## 2015-05-19 NOTE — Telephone Encounter (Signed)
New message    Wife calling husband C/O Afib again . Wife wants to talk with Webb Silversmith when she returns on tomorrow.  Patient rested all day yesterday - today he at the office.    No chest pain, No sob.

## 2015-05-19 NOTE — Telephone Encounter (Signed)
Very important to take the Multaq bid.  Would have him come by to get ECG.  If he is having frequent breakthrough with Multaq, would consider transition to dofetilide.

## 2015-05-19 NOTE — Telephone Encounter (Signed)
Spoke with pt wife, Lorre Nick (okay per DPR), stated that pt started not feeling well on Tuesday telling her that he had a stomach flu and just wanted to stay in bed.  Pt cared for him as he had flu and he was feeling a bit better yesterday, and went into office this morning for meetings and has been there all day.  Pt wife received a call today from his secretary stating that he told her he has been in A. Fib past few days and he is feeling better.  Pt wife worried as he does not tell her most times when he is in a. Fib and she has gone back and checked all of his medications to see if he has been taking them.  Per his counts, since last refill, pt has been takign Cardizem on schedule. Eliquis he has been taking to much from what seh can tell and pt will be about 7 days short on medication. Multaq pt is to take BID with meals.  She stated he is taking his morning medications fine as she sets them up in a AM pill box.  Pt insists on not having a PM pill box that he will take them at bedtime, but he is not consistent and does not admit when he misses doses. Pt has no c/o of SOB or CP to her but was unable to talk to pt as he was in meetings.  She wants to let [name] know what is going on and if there is any suggestions he may have, as pt is not compliant.

## 2015-05-20 ENCOUNTER — Encounter: Payer: Self-pay | Admitting: Cardiology

## 2015-05-20 ENCOUNTER — Ambulatory Visit (INDEPENDENT_AMBULATORY_CARE_PROVIDER_SITE_OTHER): Payer: BLUE CROSS/BLUE SHIELD | Admitting: Cardiology

## 2015-05-20 VITALS — BP 104/58 | HR 62 | Ht 73.0 in | Wt 200.4 lb

## 2015-05-20 DIAGNOSIS — I739 Peripheral vascular disease, unspecified: Secondary | ICD-10-CM

## 2015-05-20 DIAGNOSIS — I480 Paroxysmal atrial fibrillation: Secondary | ICD-10-CM | POA: Diagnosis not present

## 2015-05-20 DIAGNOSIS — J439 Emphysema, unspecified: Secondary | ICD-10-CM | POA: Diagnosis not present

## 2015-05-20 DIAGNOSIS — E782 Mixed hyperlipidemia: Secondary | ICD-10-CM | POA: Diagnosis not present

## 2015-05-20 DIAGNOSIS — I6523 Occlusion and stenosis of bilateral carotid arteries: Secondary | ICD-10-CM

## 2015-05-20 NOTE — Telephone Encounter (Signed)
Spoke with patient, advised pt I have scheduled him to see Dr Aundra Dubin today at [facility] to get EKG and discuss medications.

## 2015-05-20 NOTE — Patient Instructions (Signed)
Medication Instructions:  No changes today  Labwork: None today  Testing/Procedures: None today  Follow-Up: Your physician recommends that you schedule a follow-up appointment in: 4 months with Dr Aundra Dubin.    Any Other Special Instructions Will Be Listed Below (If Applicable).  Your physician has requested that you regularly monitor and record your blood pressure readings at home. Please use the same machine at the same time of day to check your readings and record them. I will call you in 2-3 weeks to get the readings.

## 2015-05-20 NOTE — Telephone Encounter (Signed)
LMTCB

## 2015-05-22 NOTE — Progress Notes (Signed)
Patient ID: Troy Kenner [name], male   DOB: 09/26/1941, 74 y.o.   MRN: [id number] PCP: Dr. Linna Darner  74 yo with history of HTN, COPD, active smoking/COPD, carotid stenosis s/p right CEA, and PAD presents for cardiology followup.  He does not have known CAD but is at high risk for CAD based on his comorbidities.  Lexiscan Cardiolite in 3/14 showed no ischemia or infarction and echo in 3/14 showed normal EF.  He had a left fem-pop bypass at the [facility] in the '90s.  He is followed at [facility] for PAD, last ABIs in 3/16 showed mild disease on the left and moderate disease on the right (unchanged). He has chronic right calf/thigh/buttocks claudication that is unchanged over the last few years and follows regularly at [facility].  He has had right lower lobectomy for lung cancer.  He continues to stay off cigarettes.    At a prior appointment, he was in atrial fibrillation but did not realize it.  I started him on Eliquis and diltiazem CD, and he spontaneously converted to NSR.  In 5/15, he was at [facility] and felt "strange" one day: fatigued, weak, short of breath.  He went to the ER and was in atrial fibrillation with HR in 80s-90s.  He spontaneously converted to NSR in the ER.  He felt back to normal after converting to NSR.  I started him on Multaq 400 mg bid.    Given generalized fatigue, he stopped ramipril.  This led to considerable improvement in his fatigue.  He denies dyspnea on flat ground.  He is short of breath walking up hills.  No chest pain.  He does not have claudication walking on flat ground but does have pain in the right calf with walking 1-2 blocks up an incline.  No syncope or lightheadedness.  Earlier this week, he had some lightheadedness with standing and felt generally bad.  It lasted about a day.  He wonders if he was in atrial fibrillation.  He is in NSR today.  BP is running on the low side today.  He sometimes forgets to take his pm Multaq.    Labs (3/13): LDL 75, HDL 35, K 4.1, creatinine  1.1 Labs (3/14): LDL 91, HDL 27 Labs (10/14): K 4.3, creatinine 0.98 Labs (4/15): K 4.9, creatinine 1.1, LDL 76, HDL 30 Labs (5/15): K 4.3, creatinine 1.7, BNP 251 Labs (6/15): K 4.4, creatinine 1.3 Labs (10/15): TSH normal Labs (5/16): K 4.2, creatinine 1.12, HCT 39.5, LFTs normal, LDL 84, HDL 43, TSH normal  ECG: NSR, normal  PMH: 1. HTN: Fatigue and cough with ramipril use.  2. COPD: Quit smoking 2014.  3. AAA: CT 1/13 with 3.0 cm AAA.  Abdominal US (1/14) with 3.4 cm AAA. Abdominal US (4/15) with 3.25 x 3.27 AAA. Abdominal US (3/16) with 3.7 cm AAA.   4. Squamous cell lung cancer diagnosed 2/12.  Had right lower lobectomy in 4/12.  5. Hyperlipidemia 6. PAD: Left fem-pop bypass 1994.  ABIs (2/12) 0.62 on right, 0.95 on left. ABIs (1/14): 0.65 on right, 1.04 on left. ABIs (4/15) 0.66 right, 0.87 left.  ABIs (3/16) 0.6 right, 0.88 left.  7. Stress myoview 2004 was normal. Lexiscan Cardiolite (3/14) with EF 66%, no ischemia or infarction.  8. Echo (3/14): technically difficult with EF 55-60%, upper normal RV size.  Echo (5/16) with EF 60-65%.   9. Carotid stenosis: TIA 10/14.  Carotid dopplers with > 87% RICA, 56-43% LICA.  Patient had right CEA in 10/14.  Carotids (4/15) patent right CEA, LICA 46-50% stenosis.  Carotids (35/46) < 56% LICA, patent right CEA.  10. Atrial fibrillation: Paroxysmal. First noted after lobectomy in 4/12 (brief), recurrence in 4/15 then in 5/15.  11. Spinal stenosis  SH: Married, lives in [place]  [place].  Quit smoking in 2014.   FH: No premature CAD  ROS: All systems reviewed and negative except as per HPI.   Current Outpatient Prescriptions  Medication Sig Dispense Refill  . bimatoprost (LUMIGAN) 0.03 % ophthalmic solution Place 1 drop into both eyes at bedtime.    [name] BIOTIN PO Take 5,000 mcg by mouth daily.    . Bisacodyl (DULCOLAX PO) Take by mouth as needed.    . brimonidine (ALPHAGAN P) 0.1 % SOLN Place 3 drops into the left  eye 3 (three) times daily.     . cilostazol (PLETAL) 100 MG tablet Take 1 tablet (100 mg total) by mouth 2 (two) times daily. 180 tablet 1  . CRESTOR 40 MG tablet TAKE 1 TABLET ONCE DAILY. 90 tablet 0  . diltiazem (CARDIZEM CD) 120 MG 24 hr capsule TAKE 1 CAPSULE DAILY. 30 capsule 3  . dorzolamide-timolol (COSOPT) 22.3-6.8 MG/ML ophthalmic solution Place 2 drops into the left eye 2 (two) times daily.     [name] doxazosin (CARDURA) 4 MG tablet TAKE (1/2) TABLET DAILY. 45 tablet 0  . ELIQUIS 5 MG TABS tablet TAKE 1 TABLET TWICE DAILY. 60 tablet 3  . FLUoxetine (PROZAC) 20 MG capsule TAKE (1) CAPSULE DAILY. 90 capsule 1  . Ginkgo Biloba 40 MG TABS Take by mouth. daily    . MELATONIN PO Take 3 mg by mouth at bedtime as needed (sleep).     . MULTAQ 400 MG tablet TAKE 1 TABLET TWICE DAILY WITH A MEAL. 60 tablet 3  . Multiple Vitamins-Minerals (CENTRUM SILVER PO) Take 1 tablet by mouth daily.    . ranitidine (ZANTAC) 150 MG tablet Take 150 mg by mouth as needed for heartburn.     No current facility-administered medications for this visit.    BP 104/58 mmHg  Pulse 62  Ht '6\' 1"'$  (1.854 m)  Wt 200 lb 6.4 oz (90.901 kg)  BMI 26.45 kg/m2 General: NAD Neck: JVP 7 cm, no thyromegaly or thyroid nodule.  Lungs: Mildly decreased breath sounds right base.  CV: Nondisplaced PMI.  Heart regular S1/S2, no S3/S4, 1/6 SEM RUSB.  No edema.  No carotid bruit. Right foot is warm but unable to palpate PT pulse.   Abdomen: Soft, nontender, no hepatosplenomegaly, no distention.   Neurologic: Alert and oriented x 3.  Psych: Normal affect. Extremities: No clubbing or cyanosis.   Assessment/Plan: 1. Chest pain: He has had no further chest pain.  Lexiscan Cardiolite was negative in 3/14 and EF was preserved on echo.   2. Hyperlipidemia: Lipids ok in 5/16.  He will continue Crestor 40 daily.   3. Carotid stenosis: s/p R CEA.  Will follow with [facility] for residual LICA stenosis.  4. PAD: Stable but significant claudication.   No foot ulcerations or rest pain.  Followed at [facility]. He is on cilostazol. He knows to try to walk through the pain for at least a short distance.   5. AAA: Stable on recent US.    6. COPD: Dyspnea improved with Spiriva and now that he has stopped smoking.   7. Atrial fibrillation: Patient remains in NSR on dronedarone today.  CHADSVASC 3 (age, vascular disease, HTN).  He is also on  diltiazem CD 120 mg daily.  It is possible that he had atrial fibrillation several days ago when he was feeling "bad."  I encouraged him to try to take his dronedarone twice a day every day.  He is going to start using a pillbox.  8. CKD: Recent creatinine looks ok.  9. Fatigue: Much improved off ramipril.  He will stay off this.  BP is on the low side today.  Usually higher.  I will have him check his BP daily.  If BP running low, can discontinue doxazosin.    [name] 05/22/2015

## 2015-06-08 ENCOUNTER — Telehealth: Payer: Self-pay | Admitting: *Deleted

## 2015-06-08 NOTE — Telephone Encounter (Signed)
BP not low, would not stop doxazosin

## 2015-06-08 NOTE — Telephone Encounter (Signed)
Copied from Dr Claris Gladden 05/22/15 office note: Fatigue: Much improved off ramipril. He will stay off this. BP is on the low side today. Usually higher. I will have him check his BP daily. If BP running low, can discontinue doxazosin.   06/08/15 LMTCB for pt.

## 2015-06-08 NOTE — Telephone Encounter (Signed)
I spoke with patient's wife, pt out of town. Pt's wife states pt has not checked his BP regularly. Pt's wife states pt checked his BP 05/29/15 -152/70 HR 63 Pt's wife states the next time he checked his BP was last night-164/65 HR 63. Pt's wife states she is not sure their BP cuff is accurate.  Pt's wife states pt does not want to go to [facility] or another pharmacy to get BP checked. Pt denies any lightheadedness or dizziness.  Pt's wife advised I will forward to Dr Aundra Dubin for review.

## 2015-06-09 NOTE — Telephone Encounter (Signed)
Follow up      Calling to give bp reading for this am----165/79 pulse 72 at 10:30 am

## 2015-06-09 NOTE — Telephone Encounter (Signed)
Pt called his BP reading. Pt said the his BP has been ranging from 935'L to 217 systolic. Today's BP was 165/79 HR 72 beats/minute about 10:30 AM . Pt continues to take his medication Cardura 4 mg 1/2 tablet once daily.

## 2015-06-09 NOTE — Telephone Encounter (Signed)
Left pt a message to call back. 

## 2015-06-10 NOTE — Telephone Encounter (Signed)
Pt advised to continue doxazosin, continue to take and record his BP and HR, will follow up with BP readings in 2 weeks, Pt states he will check his BP regularly for the next couple of weeks.

## 2015-06-22 ENCOUNTER — Emergency Department (HOSPITAL_COMMUNITY): Payer: BLUE CROSS/BLUE SHIELD

## 2015-06-22 ENCOUNTER — Encounter (HOSPITAL_COMMUNITY): Payer: Self-pay | Admitting: *Deleted

## 2015-06-22 ENCOUNTER — Telehealth: Payer: Self-pay | Admitting: Cardiology

## 2015-06-22 ENCOUNTER — Observation Stay (HOSPITAL_COMMUNITY)
Admission: EM | Admit: 2015-06-22 | Discharge: 2015-06-23 | Disposition: A | Discharge status: Home / Self Care | Payer: BLUE CROSS/BLUE SHIELD | Attending: Internal Medicine | Admitting: Internal Medicine

## 2015-06-22 DIAGNOSIS — R0602 Shortness of breath: Secondary | ICD-10-CM | POA: Insufficient documentation

## 2015-06-22 DIAGNOSIS — Z85828 Personal history of other malignant neoplasm of skin: Secondary | ICD-10-CM | POA: Insufficient documentation

## 2015-06-22 DIAGNOSIS — H409 Unspecified glaucoma: Secondary | ICD-10-CM | POA: Diagnosis not present

## 2015-06-22 DIAGNOSIS — Z87891 Personal history of nicotine dependence: Secondary | ICD-10-CM | POA: Diagnosis not present

## 2015-06-22 DIAGNOSIS — Z8673 Personal history of transient ischemic attack (TIA), and cerebral infarction without residual deficits: Secondary | ICD-10-CM | POA: Insufficient documentation

## 2015-06-22 DIAGNOSIS — R51 Headache: Secondary | ICD-10-CM | POA: Insufficient documentation

## 2015-06-22 DIAGNOSIS — R350 Frequency of micturition: Secondary | ICD-10-CM | POA: Insufficient documentation

## 2015-06-22 DIAGNOSIS — J449 Chronic obstructive pulmonary disease, unspecified: Secondary | ICD-10-CM | POA: Diagnosis not present

## 2015-06-22 DIAGNOSIS — N183 Chronic kidney disease, stage 3 unspecified: Secondary | ICD-10-CM | POA: Diagnosis present

## 2015-06-22 DIAGNOSIS — K219 Gastro-esophageal reflux disease without esophagitis: Secondary | ICD-10-CM | POA: Insufficient documentation

## 2015-06-22 DIAGNOSIS — Z888 Allergy status to other drugs, medicaments and biological substances status: Secondary | ICD-10-CM | POA: Insufficient documentation

## 2015-06-22 DIAGNOSIS — I129 Hypertensive chronic kidney disease with stage 1 through stage 4 chronic kidney disease, or unspecified chronic kidney disease: Secondary | ICD-10-CM | POA: Diagnosis not present

## 2015-06-22 DIAGNOSIS — I739 Peripheral vascular disease, unspecified: Secondary | ICD-10-CM | POA: Insufficient documentation

## 2015-06-22 DIAGNOSIS — Z882 Allergy status to sulfonamides status: Secondary | ICD-10-CM | POA: Diagnosis not present

## 2015-06-22 DIAGNOSIS — E669 Obesity, unspecified: Secondary | ICD-10-CM | POA: Insufficient documentation

## 2015-06-22 DIAGNOSIS — I4892 Unspecified atrial flutter: Secondary | ICD-10-CM | POA: Diagnosis not present

## 2015-06-22 DIAGNOSIS — I4891 Unspecified atrial fibrillation: Secondary | ICD-10-CM | POA: Diagnosis present

## 2015-06-22 DIAGNOSIS — I480 Paroxysmal atrial fibrillation: Secondary | ICD-10-CM | POA: Diagnosis not present

## 2015-06-22 DIAGNOSIS — N401 Enlarged prostate with lower urinary tract symptoms: Secondary | ICD-10-CM | POA: Diagnosis not present

## 2015-06-22 DIAGNOSIS — R351 Nocturia: Secondary | ICD-10-CM | POA: Diagnosis not present

## 2015-06-22 DIAGNOSIS — E785 Hyperlipidemia, unspecified: Secondary | ICD-10-CM | POA: Insufficient documentation

## 2015-06-22 DIAGNOSIS — R3915 Urgency of urination: Secondary | ICD-10-CM | POA: Insufficient documentation

## 2015-06-22 DIAGNOSIS — I714 Abdominal aortic aneurysm, without rupture, unspecified: Secondary | ICD-10-CM | POA: Diagnosis present

## 2015-06-22 DIAGNOSIS — Z7901 Long term (current) use of anticoagulants: Secondary | ICD-10-CM | POA: Diagnosis not present

## 2015-06-22 DIAGNOSIS — C3491 Malignant neoplasm of unspecified part of right bronchus or lung: Secondary | ICD-10-CM

## 2015-06-22 DIAGNOSIS — Z6825 Body mass index (BMI) 25.0-25.9, adult: Secondary | ICD-10-CM | POA: Diagnosis not present

## 2015-06-22 DIAGNOSIS — Z880 Allergy status to penicillin: Secondary | ICD-10-CM | POA: Diagnosis not present

## 2015-06-22 DIAGNOSIS — I440 Atrioventricular block, first degree: Secondary | ICD-10-CM | POA: Diagnosis not present

## 2015-06-22 DIAGNOSIS — I6523 Occlusion and stenosis of bilateral carotid arteries: Secondary | ICD-10-CM | POA: Diagnosis not present

## 2015-06-22 DIAGNOSIS — R42 Dizziness and giddiness: Secondary | ICD-10-CM | POA: Insufficient documentation

## 2015-06-22 DIAGNOSIS — Z85118 Personal history of other malignant neoplasm of bronchus and lung: Secondary | ICD-10-CM | POA: Diagnosis not present

## 2015-06-22 DIAGNOSIS — E782 Mixed hyperlipidemia: Secondary | ICD-10-CM | POA: Diagnosis present

## 2015-06-22 DIAGNOSIS — C3490 Malignant neoplasm of unspecified part of unspecified bronchus or lung: Secondary | ICD-10-CM | POA: Diagnosis present

## 2015-06-22 LAB — CBC
HCT: 43.3 % (ref 39.0–52.0)
Hemoglobin: 14 g/dL (ref 13.0–17.0)
MCH: 27.8 pg (ref 26.0–34.0)
MCHC: 32.3 g/dL (ref 30.0–36.0)
MCV: 85.9 fL (ref 78.0–100.0)
PLATELETS: 287 10*3/uL (ref 150–400)
RBC: 5.04 MIL/uL (ref 4.22–5.81)
RDW: 14.5 % (ref 11.5–15.5)
WBC: 6.7 10*3/uL (ref 4.0–10.5)

## 2015-06-22 LAB — I-STAT TROPONIN, ED: Troponin i, poc: 0 ng/mL (ref 0.00–0.08)

## 2015-06-22 LAB — BASIC METABOLIC PANEL
Anion gap: 8 (ref 5–15)
BUN: 15 mg/dL (ref 6–20)
CO2: 25 mmol/L (ref 22–32)
CREATININE: 1.33 mg/dL — AB (ref 0.61–1.24)
Calcium: 9.4 mg/dL (ref 8.9–10.3)
Chloride: 105 mmol/L (ref 101–111)
GFR calc Af Amer: 59 mL/min — ABNORMAL LOW (ref >60)
GFR, EST NON AFRICAN AMERICAN: 51 mL/min — AB (ref >60)
Glucose, Bld: 122 mg/dL — ABNORMAL HIGH (ref 65–99)
Potassium: 4.6 mmol/L (ref 3.5–5.1)
SODIUM: 138 mmol/L (ref 135–145)

## 2015-06-22 MED ORDER — BISACODYL 5 MG PO TBEC: 10.0000 mg | DELAYED_RELEASE_TABLET | Freq: Every day | ORAL | Status: DC | PRN

## 2015-06-22 MED ORDER — DRONEDARONE HCL 400 MG PO TABS
400.0000 mg | ORAL_TABLET | Freq: Two times a day (BID) | ORAL | Status: DC
  Administered 2015-06-22 – 2015-06-23 (×2): 400 mg via ORAL
  Filled 2015-06-22 (×4): qty 1

## 2015-06-22 MED ORDER — CILOSTAZOL 100 MG PO TABS
100.0000 mg | ORAL_TABLET | Freq: Two times a day (BID) | ORAL | Status: DC
  Administered 2015-06-22 – 2015-06-23 (×2): 100 mg via ORAL
  Filled 2015-06-22 (×2): qty 1

## 2015-06-22 MED ORDER — SODIUM CHLORIDE 0.9 % IV BOLUS (SEPSIS)
500.0000 mL | Freq: Once | INTRAVENOUS | Status: AC
  Administered 2015-06-22: 500 mL via INTRAVENOUS

## 2015-06-22 MED ORDER — BRIMONIDINE TARTRATE 0.15 % OP SOLN
1.0000 [drp] | Freq: Three times a day (TID) | OPHTHALMIC | Status: DC
  Filled 2015-06-22: qty 5

## 2015-06-22 MED ORDER — SODIUM CHLORIDE 0.9 % IJ SOLN: 3.0000 mL | INTRAMUSCULAR | Status: DC | PRN

## 2015-06-22 MED ORDER — DOXAZOSIN MESYLATE 2 MG PO TABS
2.0000 mg | ORAL_TABLET | Freq: Every day | ORAL | Status: DC
  Administered 2015-06-23: 2 mg via ORAL
  Filled 2015-06-22: qty 1

## 2015-06-22 MED ORDER — DORZOLAMIDE HCL-TIMOLOL MAL 2-0.5 % OP SOLN
1.0000 [drp] | Freq: Three times a day (TID) | OPHTHALMIC | Status: DC
  Filled 2015-06-22: qty 10

## 2015-06-22 MED ORDER — SODIUM CHLORIDE 0.9 % IV SOLN: 250.0000 mL | INTRAVENOUS | Status: DC | PRN

## 2015-06-22 MED ORDER — ADULT MULTIVITAMIN W/MINERALS CH
1.0000 | ORAL_TABLET | Freq: Every day | ORAL | Status: DC
  Administered 2015-06-23: 1 via ORAL
  Filled 2015-06-22: qty 1

## 2015-06-22 MED ORDER — ACETAMINOPHEN 325 MG PO TABS: 650.0000 mg | ORAL_TABLET | Freq: Four times a day (QID) | ORAL | Status: DC | PRN

## 2015-06-22 MED ORDER — FLUOXETINE HCL 20 MG PO CAPS
20.0000 mg | ORAL_CAPSULE | Freq: Every day | ORAL | Status: DC
  Administered 2015-06-23: 20 mg via ORAL
  Filled 2015-06-22: qty 1

## 2015-06-22 MED ORDER — ROSUVASTATIN CALCIUM 20 MG PO TABS
40.0000 mg | ORAL_TABLET | Freq: Every day | ORAL | Status: DC
  Administered 2015-06-23: 40 mg via ORAL
  Filled 2015-06-22: qty 2

## 2015-06-22 MED ORDER — LATANOPROST 0.005 % OP SOLN
1.0000 [drp] | Freq: Every day | OPHTHALMIC | Status: DC
  Administered 2015-06-22: 1 [drp] via OPHTHALMIC
  Filled 2015-06-22: qty 2.5

## 2015-06-22 MED ORDER — SODIUM CHLORIDE 0.9 % IJ SOLN
3.0000 mL | Freq: Two times a day (BID) | INTRAMUSCULAR | Status: DC
  Administered 2015-06-22: 3 mL via INTRAVENOUS

## 2015-06-22 MED ORDER — HYDROMORPHONE HCL 1 MG/ML IJ SOLN: 0.5000 mg | INTRAMUSCULAR | Status: DC | PRN

## 2015-06-22 MED ORDER — SODIUM CHLORIDE 0.9 % IJ SOLN
3.0000 mL | Freq: Two times a day (BID) | INTRAMUSCULAR | Status: DC
  Administered 2015-06-22 – 2015-06-23 (×2): 3 mL via INTRAVENOUS

## 2015-06-22 MED ORDER — DRONEDARONE HCL 400 MG PO TABS: 400.0000 mg | ORAL_TABLET | Freq: Two times a day (BID) | ORAL | Status: DC

## 2015-06-22 MED ORDER — APIXABAN 2.5 MG PO TABS
5.0000 mg | ORAL_TABLET | Freq: Two times a day (BID) | ORAL | Status: DC
  Administered 2015-06-22 – 2015-06-23 (×2): 5 mg via ORAL
  Filled 2015-06-22 (×2): qty 2

## 2015-06-22 MED ORDER — ACETAMINOPHEN 650 MG RE SUPP: 650.0000 mg | Freq: Four times a day (QID) | RECTAL | Status: DC | PRN

## 2015-06-22 MED ORDER — ONDANSETRON HCL 4 MG/2ML IJ SOLN: 4.0000 mg | Freq: Four times a day (QID) | INTRAMUSCULAR | Status: DC | PRN

## 2015-06-22 MED ORDER — OXYCODONE HCL 5 MG PO TABS: 5.0000 mg | ORAL_TABLET | ORAL | Status: DC | PRN

## 2015-06-22 MED ORDER — ONDANSETRON HCL 4 MG PO TABS: 4.0000 mg | ORAL_TABLET | Freq: Four times a day (QID) | ORAL | Status: DC | PRN

## 2015-06-22 MED ORDER — DILTIAZEM HCL ER COATED BEADS 120 MG PO CP24
120.0000 mg | ORAL_CAPSULE | Freq: Every day | ORAL | Status: DC
  Administered 2015-06-23: 120 mg via ORAL
  Filled 2015-06-22: qty 1

## 2015-06-22 MED ORDER — ALUM & MAG HYDROXIDE-SIMETH 200-200-20 MG/5ML PO SUSP: 30.0000 mL | Freq: Four times a day (QID) | ORAL | Status: DC | PRN

## 2015-06-22 NOTE — ED Provider Notes (Signed)
CSN: [id number]     Arrival date & time 06/22/15  1419 History   First MD Initiated Contact with Patient 06/22/15 1501     Chief Complaint  Patient presents with  . Shortness of Breath  . Arm Tingling/ Vomiting      (Consider location/radiation/quality/duration/timing/severity/associated sxs/prior Treatment) Patient is a 74 y.o. male presenting with shortness of breath. The history is provided by the patient (The patient states that he feels some dizziness and weakness today. And was passed out. He also complains of mild shortness breath. Patient states this is similar to last time he went atrial fib.).  Shortness of Breath Severity:  Mild Onset quality:  Sudden Timing:  Constant Progression:  Waxing and waning Chronicity:  New Context: activity   Associated symptoms: no abdominal pain, no chest pain, no cough, no headaches and no rash     Past Medical History  Diagnosis Date  . Hypertension   . Hyperlipidemia   . Glaucoma BOTH EYES    Dr Gershon Crane  . Hx of adenomatous colonic polyps 2005     X 2; 1 hyperplastic polyp; Dr Olevia Perches  . Impaired fasting glucose 2007    108; A1c5.4%  . History of lung cancer APRIL 2012  SQUAMOUS CELL---- S/P RIGHT LOWER LOBECTOMY AT [facility] --  NO CHEMORADIATION---  NO RECURRENCE     ONCOLOGIST- DR Tressie Stalker  LOV [facility] 10-27-2012  . GERD (gastroesophageal reflux disease)   . BPH (benign prostatic hypertrophy)   . Frequency of urination   . Urgency of urination   . Nocturia   . Lesion of bladder   . Microhematuria   . Arthritis   . History of basal cell carcinoma excision   . Exertional dyspnea   . Peripheral vascular disease S/P ANGIOPLASTY AND STENTING    FOLLOWED  BY DR [name]  . PAD (peripheral artery disease) ABI'S  JAN 2014  0.65 ON RIGHT ;  1.04 ON LEFT  . AAA (abdominal aortic aneurysm) LAST ABD. [name]  JAN 2014  3.4CM    MONITORED BY DR [name]  . Bilateral carotid artery stenosis DUPLEX 12-29-2012  BY DR [facility]    BILATERAL ICA  STENOSIS 60-79%  . COPD (chronic obstructive pulmonary disease)   . Stroke Jul 07, 2013    mini  TIA  . Cancer 01-25-12    LUNG  . Atrial fibrillation   . Spinal stenosis Sept. 2015   Past Surgical History  Procedure Laterality Date  . Angio plasty      X 4 in legs  . Colon polyectomy    . Trabecular surgery      OS  . Lung lobectomy  01/24/2011     RIGHT UPPER LOBE  (SQUAMOUS CELL CARCINOMA) Dr Dorthea Cove , [facility]. No chemotherapyor radiation  . Laryngoscopy  06-27-2004    BX VOCAL CORD  (LEUKOPLAKIA)  PER PT NO ISSUES SINCE  . Cataract extraction w/ intraocular lens  implant, bilateral    . Basal cell carcinoma excision      MULTIPLE TIMES--  RIGHT FOREARM, CHEEKS, AND BACK  . Femoral-popliteal bypass graft Left 1994  [facility]    AND 2001  IN [place]  . Cardiovascular stress test  12-08-2012  DR [facility]    NORMAL LEXISCAN WITH NO EXERCISE NUCLEAR STUDY/ EF 66%/   NO ISCHEMIA/ NO SIGNIFICANT CHANGE FROM PRIOR STUDY  . Transthoracic echocardiogram  12-29-2012  DR [facility]    MILD LVH/  LVSF NORMAL/ EF 73-53%/  GRADE I DIASTOLIC DYSFUNCTION  .  Aortogram  07-27-2002    MILD DIFFUSE ILIAC ARTERY OCCLUSIVE DISEASE /  LEFT RENAL ARTERY 20%/ PATENT LEFT FEM-POP GRAFT/ MILD SFA AND POPLITEAL ARTERY OCCLUSIVE DISEASE W/ SEVERE KIDNEY OCCLUSIVE DISEASE  . Cystoscopy w/ retrogrades Bilateral 01/21/2013    Procedure: CYSTOSCOPY WITH RETROGRADE PYELOGRAM;  Surgeon: Molli Hazard, MD;  Location: [name];  Service: Urology;  Laterality: Bilateral;   CYSTO, BLADDER BIOPSY, BILATERAL RETROGRADE PYELOGRAM  RAD TECH FROM RADIOLOGY PER JOY  . Cystoscopy with biopsy N/A 01/21/2013    Procedure: CYSTOSCOPY WITH BIOPSY;  Surgeon: Molli Hazard, MD;  Location: [facility];  Service: Urology;  Laterality: N/A;  . Endarterectomy Right 07/14/2013    Procedure: ENDARTERECTOMY CAROTID;  Surgeon: Angelia Mould, MD;  Location: [place];  Service: Vascular;   Laterality: Right;  . Patch angioplasty Right 07/14/2013    Procedure: PATCH ANGIOPLASTY;  Surgeon: Angelia Mould, MD;  Location: [facility] OR;  Service: Vascular;  Laterality: Right;  . Carotid endarterectomy Right 07-14-13    cea  . Carotid angiogram N/A 07/10/2013    Procedure: CAROTID ANGIOGRAM;  Surgeon: Elam Dutch, MD;  Location: [facility] CATH LAB;  Service: Cardiovascular;  Laterality: N/A;   Family History  Problem Relation Age of Onset  . Stroke Mother     mini strokes  . Alcohol abuse Father   . Heart disease Father     MI after 66  . Stroke Father   . Hypertension Father   . Heart attack Father   . Heart disease Paternal Aunt     several  . Hypertension Paternal Aunt     several  . Stroke Paternal Aunt     several  . Stroke Paternal Uncle     several  . Heart disease Paternal Uncle     several;2 had MI pre 49   Social History  Substance Use Topics  . Smoking status: Former Smoker -- 0.50 packs/day for 50 years    Types: Cigarettes    Quit date: 07/28/2013  . Smokeless tobacco: Never Used     Comment: pt states that he is now taking chantix  . Alcohol Use: Yes     Comment:  socially, variable    Review of Systems  Constitutional: Negative for appetite change and fatigue.  HENT: Negative for congestion, ear discharge and sinus pressure.   Eyes: Negative for discharge.  Respiratory: Positive for shortness of breath. Negative for cough.   Cardiovascular: Negative for chest pain.  Gastrointestinal: Negative for abdominal pain and diarrhea.  Genitourinary: Negative for frequency and hematuria.  Musculoskeletal: Negative for back pain.  Skin: Negative for rash.  Neurological: Negative for seizures and headaches.  Psychiatric/Behavioral: Negative for hallucinations.      Allergies  Penicillins; Sulfonamide derivatives; Niacin-lovastatin er; Sulfa antibiotics; and Atorvastatin  Home Medications   Prior to Admission medications   Medication Sig Start  Date End Date Taking? Authorizing Provider  bimatoprost (LUMIGAN) 0.03 % ophthalmic solution Place 1 drop into the left eye at bedtime.    Yes Historical Provider, MD  BIOTIN PO Take 5,000 mcg by mouth daily.   Yes Historical Provider, MD  Bisacodyl (DULCOLAX PO) Take by mouth as needed.   Yes Historical Provider, MD  brimonidine (ALPHAGAN P) 0.1 % SOLN Place 3 drops into the left eye 3 (three) times daily.    Yes Historical Provider, MD  cilostazol (PLETAL) 100 MG tablet Take 1 tablet (100 mg total) by mouth 2 (two) times daily.  08/19/14  Yes Larey Dresser, MD  CRESTOR 40 MG tablet TAKE 1 TABLET ONCE DAILY. Patient taking differently: TAKE 40 MG BY MOUTH DAILY 05/18/15  Yes Larey Dresser, MD  diltiazem (CARDIZEM CD) 120 MG 24 hr capsule TAKE 1 CAPSULE DAILY. Patient taking differently: TAKE 120 MG BY MOUTH DAILY 02/16/15  Yes Larey Dresser, MD  dorzolamide-timolol (COSOPT) 22.3-6.8 MG/ML ophthalmic solution Place 1 drop into the left eye 3 (three) times daily.    Yes Historical Provider, MD  doxazosin (CARDURA) 4 MG tablet TAKE (1/2) TABLET DAILY. Patient taking differently: Take 2 mg by mouth daily.  04/21/15  Yes Hendricks Limes, MD  ELIQUIS 5 MG TABS tablet TAKE 1 TABLET TWICE DAILY. Patient taking differently: TAKE 5 MG BY MOUTH TWICE DAILY 02/16/15  Yes Larey Dresser, MD  FLUoxetine (PROZAC) 20 MG capsule TAKE (1) CAPSULE DAILY. Patient taking differently: TAKE 20 MG BY MOUTH DAILY 02/18/15  Yes Hendricks Limes, MD  Ginkgo Biloba 40 MG TABS Take 40 mg by mouth daily. daily   Yes Historical Provider, MD  MELATONIN PO Take 3 mg by mouth at bedtime as needed (sleep).    Yes Historical Provider, MD  MULTAQ 400 MG tablet TAKE 1 TABLET TWICE DAILY WITH A MEAL. Patient taking differently: TAKE 400 MG BY MOUTH TWICE DAILY WITH A MEAL 01/11/15  Yes Larey Dresser, MD  Multiple Vitamins-Minerals (CENTRUM SILVER PO) Take 1 tablet by mouth daily.   Yes Historical Provider, MD  ranitidine  (ZANTAC) 150 MG tablet Take 150 mg by mouth as needed for heartburn.   Yes Historical Provider, MD   BP 138/70 mmHg  Pulse 83  Temp(Src) 98.2 F (36.8 C) (Oral)  Resp 18  SpO2 97% Physical Exam  Constitutional: He is oriented to person, place, and time. He appears well-developed.  HENT:  Head: Normocephalic.  Eyes: Conjunctivae and EOM are normal. No scleral icterus.  Neck: Neck supple. No thyromegaly present.  Cardiovascular: Normal rate.  Exam reveals no gallop and no friction rub.   No murmur heard. Irregular rhythm  Pulmonary/Chest: No stridor. He has no wheezes. He has no rales. He exhibits no tenderness.  Abdominal: He exhibits no distension. There is no tenderness. There is no rebound.  Musculoskeletal: Normal range of motion. He exhibits no edema.  Lymphadenopathy:    He has no cervical adenopathy.  Neurological: He is oriented to person, place, and time. He exhibits normal muscle tone. Coordination normal.  Skin: No rash noted. No erythema.  Psychiatric: He has a normal mood and affect. His behavior is normal.    ED Course  Procedures (including critical care time) Labs Review Labs Reviewed  BASIC METABOLIC PANEL - Abnormal; Notable for the following:    Glucose, Bld 122 (*)    Creatinine, Ser 1.33 (*)    GFR calc non Af Amer 51 (*)    GFR calc Af Amer 59 (*)    All other components within normal limits  CBC  I-STAT TROPOININ, ED    Imaging Review Dg Chest 2 View  06/22/2015   CLINICAL DATA:  Shortness of breath, dizziness and headache beginning today. Felt like he was going to faint. History lung cancer.  EXAM: CHEST  2 VIEW  COMPARISON:  01/31/2015  FINDINGS: Lungs are hypoinflated with mild volume loss of the right lung unchanged. Stable opacification in the right base likely small effusion/scarring. Cardiomediastinal silhouette and remainder of the exam is unchanged.  IMPRESSION: No active cardiopulmonary disease.  Stable right base opacification likely small  effusions/scarring.   Electronically Signed   By: Marin Olp M.D.   On: 06/22/2015 15:02   Ct Head Wo Contrast  06/22/2015   CLINICAL DATA:  Sudden onset shortness of breath and dizziness 2 p.m. today with arm tingling, history of stroke and atrial fibrillation  EXAM: CT HEAD WITHOUT CONTRAST  TECHNIQUE: Contiguous axial images were obtained from the base of the skull through the vertex without intravenous contrast.  COMPARISON:  07/06/2013  FINDINGS: Moderate diffuse atrophy. Moderate low attenuation in the deep white matter. No evidence of vascular territory infarct. No evidence of mass or hydrocephalus. No hemorrhage or extra-axial fluid. Right vertebral artery calcification and bilateral intracranial internal carotid artery calcification noted. Calvarium intact. Visualized portions of the paranasal sinuses and mastoid air cells clear.  IMPRESSION: Age-related involutional change with no acute findings   Electronically Signed   By: Skipper Cliche M.D.   On: 06/22/2015 16:00   I have personally reviewed and evaluated these images and lab results as part of my medical decision-making.   EKG Interpretation   Date/Time:  Wednesday June 22 2015 14:23:02 EDT Ventricular Rate:  105 PR Interval:    QRS Duration: 78 QT Interval:  339 QTC Calculation: 448 R Axis:   72 Text Interpretation:  Atrial flutter with predominant 2:1 AV block no  acute ischemia No significant change since last tracing Confirmed by  Gerald Leitz ([id number]) on 06/22/2015 2:43:05 PM      MDM   Final diagnoses:  Atrial fibrillation, unspecified    Diagnosis is atrial fibrillation. I spoke with the patient's cardiologist [name]. It was decided to have the patient admitted to medicine. And work on getting the patient back into sinus rhythm in the hospital.    Milton Ferguson, MD 06/22/15 [phone]

## 2015-06-22 NOTE — H&P (Signed)
[facility] Admission History and Physical       Troy Woodrick [name] LDJ:[id number] DOB: 1940/10/06 DOA: 06/22/2015  Referring physician: EDP PCP: [name], MD  Specialists:   Chief Complaint: Light headed and Dizzy  HPI: Cyruss Arata [name] is a 74 y.o. male with a history of Atrial Fibrillation who presents tot he ED with complaints of dizziness and fatigue and nearly faint feeling this afternoon while he was at work walking to his car, and he felt as if he was in A.fib.   He denies any chest pain or SOB he also denies any palpitations.   He was found to have a heart rate up to the 140's, but while in the ED he was found to have Atrial fibrillation with a rate in the 80's.   The EDP contacted Cardiology On Call for [name] and they will see the patent in the AM and and evaluate for further intervention.     Review of Systems:  Constitutional: No Weight Loss, No Weight Gain, Night Sweats, Fevers, Chills, Dizziness, +Light Headedness, +Fatigue, or Generalized Weakness HEENT: No Headaches, Difficulty Swallowing,Tooth/Dental Problems,Sore Throat,  No Sneezing, Rhinitis, Ear Ache, Nasal Congestion, or Post Nasal Drip,  Cardio-vascular:  No Chest pain, Orthopnea, PND, Edema in Lower Extremities, Anasarca, Dizziness, Palpitations  Resp: No Dyspnea, No DOE, No Productive Cough, No Non-Productive Cough, No Hemoptysis, No Wheezing.    GI: No Heartburn, Indigestion, Abdominal Pain, Nausea, Vomiting, Diarrhea, Constipation, Hematemesis, Hematochezia, Melena, Change in Bowel Habits,  Loss of Appetite  GU: No Dysuria, No Change in Color of Urine, No Urgency or Urinary Frequency, No Flank pain.  Musculoskeletal: No Joint Pain or Swelling, No Decreased Range of Motion, No Back Pain.  Neurologic: +Near Syncope, No Seizures, Muscle Weakness, Paresthesia, Vision Disturbance or Loss, No Diplopia, No Vertigo, No Difficulty Walking,  Skin: No Rash or Lesions. Psych: No Change in Mood or Affect, No  Depression or Anxiety, No Memory loss, No Confusion, or Hallucinations   Past Medical History  Diagnosis Date  . Hypertension   . Hyperlipidemia   . Glaucoma BOTH EYES    Dr Gershon Crane  . Hx of adenomatous colonic polyps 2005     X 2; 1 hyperplastic polyp; Dr Olevia Perches  . Impaired fasting glucose 2007    108; A1c5.4%  . History of lung cancer APRIL 2012  SQUAMOUS CELL---- S/P RIGHT LOWER LOBECTOMY AT [facility] --  NO CHEMORADIATION---  NO RECURRENCE     ONCOLOGIST- DR Tressie Stalker  LOV IN [facility] 10-27-2012  . GERD (gastroesophageal reflux disease)   . BPH (benign prostatic hypertrophy)   . Frequency of urination   . Urgency of urination   . Nocturia   . Lesion of bladder   . Microhematuria   . Arthritis   . History of basal cell carcinoma excision   . Exertional dyspnea   . Peripheral vascular disease S/P ANGIOPLASTY AND STENTING    FOLLOWED  BY DR [name]  . PAD (peripheral artery disease) ABI'S  JAN 2014  0.65 ON RIGHT ;  1.04 ON LEFT  . AAA (abdominal aortic aneurysm) LAST ABD. [name]  JAN 2014  3.4CM    MONITORED BY DR [name]  . Bilateral carotid artery stenosis DUPLEX 12-29-2012  BY DR [facility]    BILATERAL ICA STENOSIS 60-79%  . COPD (chronic obstructive pulmonary disease)   . Stroke Jul 07, 2013    mini  TIA  . Cancer 01-25-12    LUNG  . Atrial fibrillation   .  Spinal stenosis Sept. 2015     Past Surgical History  Procedure Laterality Date  . Angio plasty      X 4 in legs  . Colon polyectomy    . Trabecular surgery      OS  . Lung lobectomy  01/24/2011     RIGHT UPPER LOBE  (SQUAMOUS CELL CARCINOMA) Dr Dorthea Cove , Dr [facility]. No chemotherapyor radiation  . Laryngoscopy  06-27-2004    BX VOCAL CORD  (LEUKOPLAKIA)  PER PT NO ISSUES SINCE  . Cataract extraction w/ intraocular lens  implant, bilateral    . Basal cell carcinoma excision      MULTIPLE TIMES--  RIGHT FOREARM, CHEEKS, AND BACK  . Femoral-popliteal bypass graft Left 1994  [facility]    AND 2001  IN [place]  . Cardiovascular  stress test  12-08-2012  DR [facility]    NORMAL LEXISCAN WITH NO EXERCISE NUCLEAR STUDY/ EF 66%/   NO ISCHEMIA/ NO SIGNIFICANT CHANGE FROM PRIOR STUDY  . Transthoracic echocardiogram  12-29-2012  DR [facility]    MILD LVH/  LVSF NORMAL/ EF 31-51%/  GRADE I DIASTOLIC DYSFUNCTION  . Aortogram  07-27-2002    MILD DIFFUSE ILIAC ARTERY OCCLUSIVE DISEASE /  LEFT RENAL ARTERY 20%/ PATENT LEFT FEM-POP GRAFT/ MILD SFA AND POPLITEAL ARTERY OCCLUSIVE DISEASE W/ SEVERE KIDNEY OCCLUSIVE DISEASE  . Cystoscopy w/ retrogrades Bilateral 01/21/2013    Procedure: CYSTOSCOPY WITH RETROGRADE PYELOGRAM;  Surgeon: Molli Hazard, MD;  Location: [facility];  Service: Urology;  Laterality: Bilateral;   CYSTO, BLADDER BIOPSY, BILATERAL RETROGRADE PYELOGRAM  RAD TECH FROM RADIOLOGY PER JOY  . Cystoscopy with biopsy N/A 01/21/2013    Procedure: CYSTOSCOPY WITH BIOPSY;  Surgeon: Molli Hazard, MD;  Location: [facility];  Service: Urology;  Laterality: N/A;  . Endarterectomy Right 07/14/2013    Procedure: ENDARTERECTOMY CAROTID;  Surgeon: Angelia Mould, MD;  Location: [place];  Service: Vascular;  Laterality: Right;  . Patch angioplasty Right 07/14/2013    Procedure: PATCH ANGIOPLASTY;  Surgeon: Angelia Mould, MD;  Location: [facility] OR;  Service: Vascular;  Laterality: Right;  . Carotid endarterectomy Right 07-14-13    cea  . Carotid angiogram N/A 07/10/2013    Procedure: CAROTID ANGIOGRAM;  Surgeon: Elam Dutch, MD;  Location: [facility] CATH LAB;  Service: Cardiovascular;  Laterality: N/A;      Prior to Admission medications   Medication Sig Start Date End Date Taking? Authorizing Provider  bimatoprost (LUMIGAN) 0.03 % ophthalmic solution Place 1 drop into the left eye at bedtime.    Yes Historical Provider, MD  BIOTIN PO Take 5,000 mcg by mouth daily.   Yes Historical Provider, MD  Bisacodyl (DULCOLAX PO) Take by mouth as needed.   Yes Historical Provider, MD  brimonidine  (ALPHAGAN P) 0.1 % SOLN Place 3 drops into the left eye 3 (three) times daily.    Yes Historical Provider, MD  cilostazol (PLETAL) 100 MG tablet Take 1 tablet (100 mg total) by mouth 2 (two) times daily. 08/19/14  Yes Larey Dresser, MD  CRESTOR 40 MG tablet TAKE 1 TABLET ONCE DAILY. Patient taking differently: TAKE 40 MG BY MOUTH DAILY 05/18/15  Yes Larey Dresser, MD  diltiazem (CARDIZEM CD) 120 MG 24 hr capsule TAKE 1 CAPSULE DAILY. Patient taking differently: TAKE 120 MG BY MOUTH DAILY 02/16/15  Yes Larey Dresser, MD  dorzolamide-timolol (COSOPT) 22.3-6.8 MG/ML ophthalmic solution Place 1 drop into the left eye 3 (three) times daily.  Yes Historical Provider, MD  doxazosin (CARDURA) 4 MG tablet TAKE (1/2) TABLET DAILY. Patient taking differently: Take 2 mg by mouth daily.  04/21/15  Yes Hendricks Limes, MD  ELIQUIS 5 MG TABS tablet TAKE 1 TABLET TWICE DAILY. Patient taking differently: TAKE 5 MG BY MOUTH TWICE DAILY 02/16/15  Yes Larey Dresser, MD  FLUoxetine (PROZAC) 20 MG capsule TAKE (1) CAPSULE DAILY. Patient taking differently: TAKE 20 MG BY MOUTH DAILY 02/18/15  Yes Hendricks Limes, MD  Ginkgo Biloba 40 MG TABS Take 40 mg by mouth daily. daily   Yes Historical Provider, MD  MELATONIN PO Take 3 mg by mouth at bedtime as needed (sleep).    Yes Historical Provider, MD  MULTAQ 400 MG tablet TAKE 1 TABLET TWICE DAILY WITH A MEAL. Patient taking differently: TAKE 400 MG BY MOUTH TWICE DAILY WITH A MEAL 01/11/15  Yes Larey Dresser, MD  Multiple Vitamins-Minerals (CENTRUM SILVER PO) Take 1 tablet by mouth daily.   Yes Historical Provider, MD  ranitidine (ZANTAC) 150 MG tablet Take 150 mg by mouth as needed for heartburn.   Yes Historical Provider, MD     Allergies  Allergen Reactions  . Penicillins Hives and Shortness Of Breath    Flushing  & dyspnea Because of a history of documented adverse serious drug reaction;Medi Alert bracelet  is recommended PCN reaction causing immediate  rash, facial/tongue/throat swelling, SOB or lightheadedness with hypotension: Yes PCN reaction causing severe rash involving mucus membranes or skin necrosis: No PCN reaction occurring within the last 10 years: NO PCN reaction that required hospitalization: NO  . Sulfonamide Derivatives Shortness Of Breath    Flushing & dyspnea Because of a history of documented adverse serious drug reaction;Medi Alert bracelet  is recommended  . Niacin-Lovastatin Er Other (See Comments)    Dyspnea, flushing  . Sulfa Antibiotics Hives  . Atorvastatin Other (See Comments)    Myalgias & athralgias    Social History:  reports that he quit smoking about 22 months ago. His smoking use included Cigarettes. He has a 25 pack-year smoking history. He has never used smokeless tobacco. He reports that he drinks alcohol. He reports that he does not use illicit drugs.    Family History  Problem Relation Age of Onset  . Stroke Mother     mini strokes  . Alcohol abuse Father   . Heart disease Father     MI after 54  . Stroke Father   . Hypertension Father   . Heart attack Father   . Heart disease Paternal Aunt     several  . Hypertension Paternal Aunt     several  . Stroke Paternal Aunt     several  . Stroke Paternal Uncle     several  . Heart disease Paternal Uncle     several;2 had MI pre 81       Physical Exam:  GEN:  Pleasant Obese Elderly 74 y.o. Caucasian male examined and in no acute distress; cooperative with exam Filed Vitals:   06/22/15 1907 06/22/15 1930 06/22/15 1945 06/22/15 1952  BP: 125/70 134/65 138/70 138/70  Pulse: 78 82 80 83  Temp:      TempSrc:      Resp: '15 13 15 18  '$ SpO2: 96% 96% 98% 97%   Blood pressure 138/70, pulse 83, temperature 98.2 F (36.8 C), temperature source Oral, resp. rate 18, SpO2 97 %. PSYCH: He is alert and oriented x4; does not appear anxious does not  appear depressed; affect is normal HEENT: Normocephalic and Atraumatic, Mucous membranes pink; PERRLA;  EOM intact; Fundi:  Benign;  No scleral icterus, Nares: Patent, Oropharynx: Clear,  Fair Dentition,    Neck:  FROM, No Cervical Lymphadenopathy nor Thyromegaly or Carotid Bruit; No JVD; Breasts:: Not examined CHEST WALL: No tenderness CHEST: Normal respiration, clear to auscultation bilaterally HEART: Irregular rate and rhythm; no murmurs rubs or gallops BACK: No kyphosis or scoliosis; No CVA tenderness ABDOMEN: Positive Bowel Sounds, Obese, Soft Non-Tender, No Rebound or Guarding; No Masses, No Organomegaly, No Pannus; No Intertriginous candida. Rectal Exam: Not done EXTREMITIES: No Cyanosis, Clubbing, or Edema; No Ulcerations. Genitalia: not examined PULSES: 2+ and symmetric SKIN: Normal hydration no rash or ulceration CNS:  Alert and Oriented x 4, No Focal Deficits Vascular: pulses palpable throughout    Labs on Admission:  Basic Metabolic Panel:  Recent Labs Lab 06/22/15 1447  NA 138  K 4.6  CL 105  CO2 25  GLUCOSE 122*  BUN 15  CREATININE 1.33*  CALCIUM 9.4   Liver Function Tests: No results for input(s): AST, ALT, ALKPHOS, BILITOT, PROT, ALBUMIN in the last 168 hours. No results for input(s): LIPASE, AMYLASE in the last 168 hours. No results for input(s): AMMONIA in the last 168 hours. CBC:  Recent Labs Lab 06/22/15 1447  WBC 6.7  HGB 14.0  HCT 43.3  MCV 85.9  PLT 287   Cardiac Enzymes: No results for input(s): CKTOTAL, CKMB, CKMBINDEX, TROPONINI in the last 168 hours.  BNP (last 3 results) No results for input(s): BNP in the last 8760 hours.  ProBNP (last 3 results) No results for input(s): PROBNP in the last 8760 hours.  CBG: No results for input(s): GLUCAP in the last 168 hours.  Radiological Exams on Admission: Dg Chest 2 View  06/22/2015   CLINICAL DATA:  Shortness of breath, dizziness and headache beginning today. Felt like he was going to faint. History lung cancer.  EXAM: CHEST  2 VIEW  COMPARISON:  01/31/2015  FINDINGS: Lungs are  hypoinflated with mild volume loss of the right lung unchanged. Stable opacification in the right base likely small effusion/scarring. Cardiomediastinal silhouette and remainder of the exam is unchanged.  IMPRESSION: No active cardiopulmonary disease.  Stable right base opacification likely small effusions/scarring.   Electronically Signed   By: Marin Olp M.D.   On: 06/22/2015 15:02   Ct Head Wo Contrast  06/22/2015   CLINICAL DATA:  Sudden onset shortness of breath and dizziness 2 p.m. today with arm tingling, history of stroke and atrial fibrillation  EXAM: CT HEAD WITHOUT CONTRAST  TECHNIQUE: Contiguous axial images were obtained from the base of the skull through the vertex without intravenous contrast.  COMPARISON:  07/06/2013  FINDINGS: Moderate diffuse atrophy. Moderate low attenuation in the deep white matter. No evidence of vascular territory infarct. No evidence of mass or hydrocephalus. No hemorrhage or extra-axial fluid. Right vertebral artery calcification and bilateral intracranial internal carotid artery calcification noted. Calvarium intact. Visualized portions of the paranasal sinuses and mastoid air cells clear.  IMPRESSION: Age-related involutional change with no acute findings   Electronically Signed   By: Skipper Cliche M.D.   On: 06/22/2015 16:00     EKG: Independently reviewed. Atrial Fibrillation/Flutter rate =105       Assessment/Plan:  74 y.o. male with  Principal Problem:   1.    Atrial fibrillation- Symptomatic   Cardiac monitoring   Continue Diltiazem and Eliquis Rx   NPO after Midnight  Cards  to see in AM      Active Problems:   2.    HYPERLIPIDEMIA   On Crestor Rx     3.    Squamous cell lung cancer   Hx Dx in 2012     4.    AAA (abdominal aortic aneurysm)   HX     5.    Chronic renal disease, stage 3, moderately decreased glomerular filtration rate between 30-59 mL/min/1.73 square meter   Monitor BUN/Cr     6.    Glaucoma   Continue  Opthalmic Rx     7.    DVT Prophylaxis   On Eliquis Rx     Code Status:     FULL CODE      Family Communication:   Wife at Bedside     Disposition Plan:   Observation Status  Expected Stay 1-2 days and return to Home       Time spent:  [facility] Pager [phone]   If [place] Please Contact the Day Rounding Team MD for [facility]  If 7PM-7AM, Please Contact Night-Floor Coverage  [email or web address] Password [email or web address] 06/22/2015, 8:06 PM     ADDENDUM:   Patient was seen and examined on 06/22/2015

## 2015-06-22 NOTE — ED Notes (Signed)
Patient transported to X-ray 

## 2015-06-22 NOTE — Telephone Encounter (Signed)
Spoke with patient's wife, [name], who states patient is in the ED at [facility] and she is concerned because a carotid doppler has not been done.  She states Dr. Claris Gladden first concern when patient is in afib is always stroke.  I advised her that our cardiology team also rounds at [facility] and that [name] is in the office this afternoon so likely another team member will evaluate him if needed.  She would like to me to ask [name] if the ED doctor has called him yet.  I advised her that I will ask him and call her back.

## 2015-06-22 NOTE — Telephone Encounter (Signed)
Called patient's wife back and advised that the ED Dr had not spoken to Dr Aundra Dubin yet and that Dr Aundra Dubin will look at the records when gets out of clinic.

## 2015-06-22 NOTE — ED Notes (Signed)
Patient transported to CT 

## 2015-06-22 NOTE — ED Notes (Signed)
EDP ZAMMIT SPEAK WITH WIFE

## 2015-06-22 NOTE — ED Notes (Signed)
MD at bedside. EDP ZAMMIT PRESENT

## 2015-06-22 NOTE — ED Notes (Signed)
Hx of afib, stroke, carotid endarectomy, lung cancer, and AAA. Pt reports sudden onset SOB and dizziness around 1400 with arm tingling. Pt denies pain at present. Vomited in ED. Able to speak in short sentences.

## 2015-06-22 NOTE — Telephone Encounter (Signed)
New message    Pt is in the ER at [facility] long  Pt is in A-FIB  The wife informed me that her husband is wrong place   to be at [facility] long with a cardiac problem  Wife said if you cant reach her by cell to contact him in the ER room 17

## 2015-06-22 NOTE — ED Notes (Signed)
Bed: WA17 Expected date:  Expected time:  Means of arrival:  Comments: Hold fm triage

## 2015-06-23 DIAGNOSIS — E782 Mixed hyperlipidemia: Secondary | ICD-10-CM | POA: Diagnosis not present

## 2015-06-23 DIAGNOSIS — N183 Chronic kidney disease, stage 3 (moderate): Secondary | ICD-10-CM | POA: Diagnosis not present

## 2015-06-23 DIAGNOSIS — I480 Paroxysmal atrial fibrillation: Secondary | ICD-10-CM | POA: Diagnosis not present

## 2015-06-23 LAB — BASIC METABOLIC PANEL
Anion gap: 7 (ref 5–15)
BUN: 16 mg/dL (ref 6–20)
CHLORIDE: 107 mmol/L (ref 101–111)
CO2: 25 mmol/L (ref 22–32)
CREATININE: 1.24 mg/dL (ref 0.61–1.24)
Calcium: 8.9 mg/dL (ref 8.9–10.3)
GFR calc Af Amer: >60 mL/min (ref >60)
GFR calc non Af Amer: 56 mL/min — ABNORMAL LOW (ref >60)
GLUCOSE: 95 mg/dL (ref 65–99)
POTASSIUM: 4.1 mmol/L (ref 3.5–5.1)
Sodium: 139 mmol/L (ref 135–145)

## 2015-06-23 LAB — CBC
HEMATOCRIT: 37.7 % — AB (ref 39.0–52.0)
Hemoglobin: 12.2 g/dL — ABNORMAL LOW (ref 13.0–17.0)
MCH: 27.7 pg (ref 26.0–34.0)
MCHC: 32.4 g/dL (ref 30.0–36.0)
MCV: 85.5 fL (ref 78.0–100.0)
PLATELETS: 235 10*3/uL (ref 150–400)
RBC: 4.41 MIL/uL (ref 4.22–5.81)
RDW: 14.5 % (ref 11.5–15.5)
WBC: 5.8 10*3/uL (ref 4.0–10.5)

## 2015-06-23 NOTE — Discharge Summary (Signed)
Physician Discharge Summary  Troy Slabach [name] VOJ:[id number] DOB: 1940/11/18 DOA: 06/22/2015  PCP: [name], MD  Cardiology: Dr. [name]  Admit date: 06/22/2015 Discharge date: 06/23/2015  Time spent: Less than 30 minutes  Recommendations for Outpatient Follow-up:  1. Orson Eva, Cardiology NP on 06/28/15 at 8:30 AM: Follow-up with atrial fibrillation clinic to discuss medication and possible ablation 2. Dr. [name], PCP  Discharge Diagnoses:  Principal Problem:   Atrial fibrillation Active Problems:   HYPERLIPIDEMIA   Squamous cell lung cancer   AAA (abdominal aortic aneurysm)   Chronic renal disease, stage 3, moderately decreased glomerular filtration rate between 30-59 mL/min/1.73 square meter   Glaucoma   Discharge Condition: Improved & Stable  Diet recommendation: Heart healthy diet.  Filed Weights   06/22/15 2112  Weight: 89.3 kg (196 lb 13.9 oz)    History of present illness & Hospital course:  74 year old male with history of HTN, HLD, COPD, carotid stenosis (status post right CEA and refused Left CEA- per daughter), atrial fibrillation on diltiazem CD, Multaq & Eliquis presented to the [facility] on 06/22/15 with subacute/acute onset of dizziness, lightheadedness, clamminess, feeling like he was going to pass out but denied chest pain or palpitations. He had some dyspnea. This happened while he was leaving work and walking in the car park at approximately 2 PM on 9/21. He had 2 episodes of nonbloody emesis. His son-in-law brought him to the ED where apparently he had a heart rate up in the 140s. He was admitted to telemetry where he demonstrated paroxysmal A. fib. His symptoms resolved sometime early last night after lasting for 6-8 hours. Cardiology consulted this morning. He has recurrent symptomatic PAF and Multaq does not appear to be controlling his A. fib. Discussed with Dr. Johnsie Cancel who recommends discharging him on prior home medications  including Cardizem CD, Multaq and Eliquis. They have arranged follow-up at the atrial fibrillation clinic and may consider ablation. As stated above, patient has PAF at this time with rate controlled A. fib alternating with sinus rhythm. He has been asymptomatic since last night and is anxious to go home. Patient did not have features consistent with TIA or stroke and CT head was negative.  Other chronic medical problems: HLD, stage III chronic kidney disease, HTN, COPD are stable.   Consultations:  Cardiology   Procedures:  None    Discharge Exam:  Complaints:  No chest pain, dyspnea or palpitations. As per patient's all symptoms resolved at approximately 10 PM last night. Anxious to go home.  Filed Vitals:   06/22/15 1952 06/22/15 2112 06/23/15 0458 06/23/15 0754  BP: 138/70 148/68 127/59   Pulse: 83 92 77   Temp:  97.7 F (36.5 C) 98.2 F (36.8 C)   TempSrc:  Oral Oral   Resp: '18 18 18   '$ Height:    '6\' 1"'$  (1.854 m)  Weight:  89.3 kg (196 lb 13.9 oz)    SpO2: 97% 100% 97%     General exam: Pleasant elderly male lying comfortably in bed. Respiratory system: Clear. No increased work of breathing. Cardiovascular system: S1 & S2 heard, RRR. No JVD, murmurs, gallops, clicks or pedal edema. Imitrex: Paroxysmal A. fib. When in A. fib it is rate controlled. Gastrointestinal system: Abdomen is nondistended, soft and nontender. Normal bowel sounds heard. Central nervous system: Alert and oriented. No focal neurological deficits. Extremities: Symmetric 5 x 5 power.  Discharge Instructions      Discharge Instructions    Call MD  for:  difficulty breathing, headache or visual disturbances    Complete by:  As directed      Call MD for:  extreme fatigue    Complete by:  As directed      Call MD for:  hives    Complete by:  As directed      Call MD for:  persistant dizziness or light-headedness    Complete by:  As directed      Call MD for:  persistant nausea and vomiting     Complete by:  As directed      Call MD for:  severe uncontrolled pain    Complete by:  As directed      Call MD for:    Complete by:  As directed   Palpitations/heart racing.     Diet - low sodium heart healthy    Complete by:  As directed      Increase activity slowly    Complete by:  As directed             Medication List    TAKE these medications        bimatoprost 0.03 % ophthalmic solution  Commonly known as:  LUMIGAN  Place 1 drop into the left eye at bedtime.     BIOTIN PO  Take 5,000 mcg by mouth daily.     brimonidine 0.1 % Soln  Commonly known as:  ALPHAGAN P  Place 3 drops into the left eye 3 (three) times daily.     CENTRUM SILVER PO  Take 1 tablet by mouth daily.     cilostazol 100 MG tablet  Commonly known as:  PLETAL  Take 1 tablet (100 mg total) by mouth 2 (two) times daily.     CRESTOR 40 MG tablet  Generic drug:  rosuvastatin  TAKE 1 TABLET ONCE DAILY.     diltiazem 120 MG 24 hr capsule  Commonly known as:  CARDIZEM CD  TAKE 1 CAPSULE DAILY.     dorzolamide-timolol 22.3-6.8 MG/ML ophthalmic solution  Commonly known as:  COSOPT  Place 1 drop into the left eye 3 (three) times daily.     doxazosin 4 MG tablet  Commonly known as:  CARDURA  TAKE (1/2) TABLET DAILY.     DULCOLAX PO  Take by mouth as needed.     ELIQUIS 5 MG Tabs tablet  Generic drug:  apixaban  TAKE 1 TABLET TWICE DAILY.     FLUoxetine 20 MG capsule  Commonly known as:  PROZAC  TAKE (1) CAPSULE DAILY.     Ginkgo Biloba 40 MG Tabs  Take 40 mg by mouth daily. daily     MELATONIN PO  Take 3 mg by mouth at bedtime as needed (sleep).     MULTAQ 400 MG tablet  Generic drug:  dronedarone  TAKE 1 TABLET TWICE DAILY WITH A MEAL.     ranitidine 150 MG tablet  Commonly known as:  ZANTAC  Take 150 mg by mouth as needed for heartburn.       Follow-up Information    Follow up with CARROLL,DONNA, NP On 06/28/2015.   Specialties:  Nurse Practitioner, Cardiology   Why:   [place] [facility] Fibrillation [facility] to discuss medication and possible ablation on 06/28/2015 at 8:30AM. [place].   Contact information:   [place] [address] [phone]       Schedule an appointment as soon as  possible for a visit with [name], MD.   Specialty:  Internal Medicine   Contact information:   [address] [address] [phone]        The results of significant diagnostics from this hospitalization (including imaging, microbiology, ancillary and laboratory) are listed below for reference.    Significant Diagnostic Studies: Dg Chest 2 View  06/22/2015   CLINICAL DATA:  Shortness of breath, dizziness and headache beginning today. Felt like he was going to faint. History lung cancer.  EXAM: CHEST  2 VIEW  COMPARISON:  01/31/2015  FINDINGS: Lungs are hypoinflated with mild volume loss of the right lung unchanged. Stable opacification in the right base likely small effusion/scarring. Cardiomediastinal silhouette and remainder of the exam is unchanged.  IMPRESSION: No active cardiopulmonary disease.  Stable right base opacification likely small effusions/scarring.   Electronically Signed   By: Marin Olp M.D.   On: 06/22/2015 15:02   Ct Head Wo Contrast  06/22/2015   CLINICAL DATA:  Sudden onset shortness of breath and dizziness 2 p.m. today with arm tingling, history of stroke and atrial fibrillation  EXAM: CT HEAD WITHOUT CONTRAST  TECHNIQUE: Contiguous axial images were obtained from the base of the skull through the vertex without intravenous contrast.  COMPARISON:  07/06/2013  FINDINGS: Moderate diffuse atrophy. Moderate low attenuation in the deep white matter. No evidence of vascular territory infarct. No evidence of mass or hydrocephalus. No hemorrhage or extra-axial fluid. Right vertebral artery calcification and bilateral intracranial internal carotid artery  calcification noted. Calvarium intact. Visualized portions of the paranasal sinuses and mastoid air cells clear.  IMPRESSION: Age-related involutional change with no acute findings   Electronically Signed   By: Skipper Cliche M.D.   On: 06/22/2015 16:00    Microbiology: No results found for this or any previous visit (from the past 240 hour(s)).   Labs: Basic Metabolic Panel:  Recent Labs Lab 06/22/15 1447 06/23/15 0428  NA 138 139  K 4.6 4.1  CL 105 107  CO2 25 25  GLUCOSE 122* 95  BUN 15 16  CREATININE 1.33* 1.24  CALCIUM 9.4 8.9   Liver Function Tests: No results for input(s): AST, ALT, ALKPHOS, BILITOT, PROT, ALBUMIN in the last 168 hours. No results for input(s): LIPASE, AMYLASE in the last 168 hours. No results for input(s): AMMONIA in the last 168 hours. CBC:  Recent Labs Lab 06/22/15 1447 06/23/15 0428  WBC 6.7 5.8  HGB 14.0 12.2*  HCT 43.3 37.7*  MCV 85.9 85.5  PLT 287 235   Cardiac Enzymes: No results for input(s): CKTOTAL, CKMB, CKMBINDEX, TROPONINI in the last 168 hours. BNP: BNP (last 3 results) No results for input(s): BNP in the last 8760 hours.  ProBNP (last 3 results) No results for input(s): PROBNP in the last 8760 hours.  CBG: No results for input(s): GLUCAP in the last 168 hours.     Discussed extensively with daughter at bedside.   Signed:  Vernell Leep, MD, FACP, FHM. [facility] Pager [phone]  If 7PM-7AM, please contact night-coverage [email or web address] Password [facility] 06/23/2015, 12:33 PM

## 2015-06-23 NOTE — Consult Note (Signed)
CARDIOLOGY CONSULT NOTE   Patient ID: Troy Kenner [name] MRN: [id number], DOB/AGE: 05/05/1941   Admit date: 06/22/2015 Date of Consult: 06/23/2015 Reason for  Consult: Atrial Fibrillation   Primary Physician: [name], MD Primary Cardiologist: [name]  HPI:  Troy Severe [name] is a 74 y.o. male with past medical history of HTN, HLD, COPD, Carotid Stenosis (s/p R CEA), PAD, and Atrial Fibrillation (Eliquis, Diltiazem CD, and Multaq) who presented to [place] ED on 06/22/2015 for shortness of breath and dizziness that started that afternoon. Was noted to be in atrial fibrillation in the ED with rate in low 100's.  Reports having the dizziness, shortness of breath, and a tingling sensation down both arms bilaterally developing all at once yesterday afternoon. He also reports being nauseated with two episodes of vomiting.    He has remained on his PO Cardizem, Eliquis, and Multaq while admitted. Overnight and this morning he has had episodes of atrial fibrillation and atrial flutter with rate being controlled in the 70's - 80's overnight. He reports his symptoms have resolved.  He has a history of atrial fibrillation with initial EKG during office visit in 12/2013 showing the patient in atrial fibrillation although he was asymptomatic. He was started on Eliquis and Diltiazem CD, and he spontaneously converted to NSR. The following month he was at [facility] and felt "strange" one day with fatigue, weakness, and  shortness of breath. He went to the ER and was in atrial fibrillation with HR in 80s-90s. He spontaneously converted to NSR in the ER. He was then started on Multaq 400 mg BID by [name].   Last seen by [name] in 05/22/2015. At that time he was reporting some generalized fatigue and Ramipril was stopped. Also reported feeling "bad" a few days prior to his appointment and thought he might have been in atrial fibrillation at that time. Was encouraged to remember to take his  Multaq twice daily.  Had NST in 11/2012 which showed no evidence of ischemia or infarction. Recent echocardiogram in 01/2015 showing EF of 60-65% and no wall motion abnormalities.  Problem List  Past Medical History  Diagnosis Date  . Hypertension   . Hyperlipidemia   . Glaucoma BOTH EYES    Dr Gershon Crane  . Hx of adenomatous colonic polyps 2005     X 2; 1 hyperplastic polyp; Dr Olevia Perches  . Impaired fasting glucose 2007    108; A1c5.4%  . History of lung cancer APRIL 2012  SQUAMOUS CELL---- S/P RIGHT LOWER LOBECTOMY AT [facility] --  NO CHEMORADIATION---  NO RECURRENCE     ONCOLOGIST- DR Tressie Stalker  LOV IN [facility] 10-27-2012  . GERD (gastroesophageal reflux disease)   . BPH (benign prostatic hypertrophy)   . Frequency of urination   . Urgency of urination   . Nocturia   . Lesion of bladder   . Microhematuria   . Arthritis   . History of basal cell carcinoma excision   . Exertional dyspnea   . Peripheral vascular disease S/P ANGIOPLASTY AND STENTING    FOLLOWED  BY DR [name]  . PAD (peripheral artery disease) ABI'S  JAN 2014  0.65 ON RIGHT ;  1.04 ON LEFT  . AAA (abdominal aortic aneurysm) LAST ABD. [name]  JAN 2014  3.4CM    MONITORED BY DR [name]  . Bilateral carotid artery stenosis DUPLEX 12-29-2012  BY DR [facility]    BILATERAL ICA STENOSIS 60-79%  . COPD (chronic obstructive pulmonary disease)   . Stroke  Jul 07, 2013    mini  TIA  . Cancer 01-25-12    LUNG  . Atrial fibrillation   . Spinal stenosis Sept. 2015    Past Surgical History  Procedure Laterality Date  . Angio plasty      X 4 in legs  . Colon polyectomy    . Trabecular surgery      OS  . Lung lobectomy  01/24/2011     RIGHT UPPER LOBE  (SQUAMOUS CELL CARCINOMA) Dr Dorthea Cove , [facility]. No chemotherapyor radiation  . Laryngoscopy  06-27-2004    BX VOCAL CORD  (LEUKOPLAKIA)  PER PT NO ISSUES SINCE  . Cataract extraction w/ intraocular lens  implant, bilateral    . Basal cell carcinoma excision      MULTIPLE TIMES--  RIGHT  FOREARM, CHEEKS, AND BACK  . Femoral-popliteal bypass graft Left 1994  [facility]    AND 2001  IN [place]  . Cardiovascular stress test  12-08-2012  DR [facility]    NORMAL LEXISCAN WITH NO EXERCISE NUCLEAR STUDY/ EF 66%/   NO ISCHEMIA/ NO SIGNIFICANT CHANGE FROM PRIOR STUDY  . Transthoracic echocardiogram  12-29-2012  DR [facility]    MILD LVH/  LVSF NORMAL/ EF 95-18%/  GRADE I DIASTOLIC DYSFUNCTION  . Aortogram  07-27-2002    MILD DIFFUSE ILIAC ARTERY OCCLUSIVE DISEASE /  LEFT RENAL ARTERY 20%/ PATENT LEFT FEM-POP GRAFT/ MILD SFA AND POPLITEAL ARTERY OCCLUSIVE DISEASE W/ SEVERE KIDNEY OCCLUSIVE DISEASE  . Cystoscopy w/ retrogrades Bilateral 01/21/2013    Procedure: CYSTOSCOPY WITH RETROGRADE PYELOGRAM;  Surgeon: Molli Hazard, MD;  Location: [facility];  Service: Urology;  Laterality: Bilateral;   CYSTO, BLADDER BIOPSY, BILATERAL RETROGRADE PYELOGRAM  RAD TECH FROM RADIOLOGY PER JOY  . Cystoscopy with biopsy N/A 01/21/2013    Procedure: CYSTOSCOPY WITH BIOPSY;  Surgeon: Molli Hazard, MD;  Location: [facility];  Service: Urology;  Laterality: N/A;  . Endarterectomy Right 07/14/2013    Procedure: ENDARTERECTOMY CAROTID;  Surgeon: Angelia Mould, MD;  Location: [place];  Service: Vascular;  Laterality: Right;  . Patch angioplasty Right 07/14/2013    Procedure: PATCH ANGIOPLASTY;  Surgeon: Angelia Mould, MD;  Location: [facility] OR;  Service: Vascular;  Laterality: Right;  . Carotid endarterectomy Right 07-14-13    cea  . Carotid angiogram N/A 07/10/2013    Procedure: CAROTID ANGIOGRAM;  Surgeon: Elam Dutch, MD;  Location: [facility] CATH LAB;  Service: Cardiovascular;  Laterality: N/A;     Allergies  Allergies  Allergen Reactions  . Penicillins Hives and Shortness Of Breath    Flushing  & dyspnea Because of a history of documented adverse serious drug reaction;Medi Alert bracelet  is recommended PCN reaction causing immediate rash,  facial/tongue/throat swelling, SOB or lightheadedness with hypotension: Yes PCN reaction causing severe rash involving mucus membranes or skin necrosis: No PCN reaction occurring within the last 10 years: NO PCN reaction that required hospitalization: NO  . Sulfonamide Derivatives Shortness Of Breath    Flushing & dyspnea Because of a history of documented adverse serious drug reaction;Medi Alert bracelet  is recommended  . Niacin-Lovastatin Er Other (See Comments)    Dyspnea, flushing  . Sulfa Antibiotics Hives  . Atorvastatin Other (See Comments)    Myalgias & athralgias- takes Crestor      Inpatient Medications . apixaban  5 mg Oral BID  . brimonidine  1 drop Left Eye TID  . cilostazol  100 mg Oral BID  . diltiazem  120 mg Oral Daily  . dorzolamide-timolol  1 drop Left Eye TID  . doxazosin  2 mg Oral Daily  . dronedarone  400 mg Oral BID WC  . FLUoxetine  20 mg Oral Daily  . latanoprost  1 drop Left Eye QHS  . multivitamin with minerals  1 tablet Oral Daily  . rosuvastatin  40 mg Oral Daily  . sodium chloride  3 mL Intravenous Q12H  . sodium chloride  3 mL Intravenous Q12H    Family History Family History  Problem Relation Age of Onset  . Stroke Mother     mini strokes  . Alcohol abuse Father   . Heart disease Father     MI after 58  . Stroke Father   . Hypertension Father   . Heart attack Father   . Heart disease Paternal Aunt     several  . Hypertension Paternal Aunt     several  . Stroke Paternal Aunt     several  . Stroke Paternal Uncle     several  . Heart disease Paternal Uncle     several;2 had MI pre 43     Social History Social History   Social History  . Marital Status: Married    Spouse Name: [name]   . Number of Children: 3  . Years of Education: 12+   Occupational History  . Not on file.   Social History Main Topics  . Smoking status: Former Smoker -- 0.50 packs/day for 50 years    Types: Cigarettes    Quit date: 07/28/2013  .  Smokeless tobacco: Never Used     Comment: pt states that he is now taking chantix  . Alcohol Use: Yes     Comment:  socially, variable  . Drug Use: No  . Sexual Activity: Not on file   Other Topics Concern  . Not on file   Social History Narrative   Patient lives at home with spouse [name]   Patient has 3 children    Patient is right handed      Review of Systems General:  No chills, fever, night sweats or weight changes.  Cardiovascular:  No chest pain, dyspnea on exertion, edema, orthopnea, paroxysmal nocturnal dyspnea. Positive for palpitations. Dermatological: No rash, lesions/masses Respiratory: No cough, Positive for dyspnea Urologic: No hematuria, dysuria Abdominal:   Positive for nausea and vomiting. Denies diarrhea, bright red blood per rectum, melena, or hematemesis Neurologic:  No visual changes, wkns, changes in mental status. All other systems reviewed and are otherwise negative except as noted above.  Physical Exam Blood pressure 127/59, pulse 77, temperature 98.2 F (36.8 C), temperature source Oral, resp. rate 18, height '6\' 1"'$  (1.854 m), weight 196 lb 13.9 oz (89.3 kg), SpO2 97 %.  General: Pleasant, NAD Psych: Normal affect. Neuro: Alert and oriented X 3. Moves all extremities spontaneously. HEENT: Normal  Neck: Supple without  JVD. Left bruit noted. Lungs:  Resp regular and unlabored, CTA without wheezing or rales. Heart: Irregularly irregular no s3, s4, or murmurs. Abdomen: Soft, non-tender, non-distended, BS + x 4.  Extremities: No clubbing, cyanosis or edema. Difficult to palpate pulses. Good coloration of all extremities.  Labs  Lab Results  Component Value Date   WBC 5.8 06/23/2015   HGB 12.2* 06/23/2015   HCT 37.7* 06/23/2015   MCV 85.5 06/23/2015   PLT 235 06/23/2015    Recent Labs Lab 06/23/15 0428  NA 139  K 4.1  CL 107  CO2 25  BUN 16  CREATININE 1.24  CALCIUM 8.9  GLUCOSE 95   Lab Results  Component Value Date   CHOL 149  02/08/2015   HDL 42.00 02/08/2015   LDLCALC 86 02/08/2015   TRIG 106.0 02/08/2015    Radiology/Studies Dg Chest 2 View: 06/22/2015   CLINICAL DATA:  Shortness of breath, dizziness and headache beginning today. Felt like he was going to faint. History lung cancer.  EXAM: CHEST  2 VIEW  COMPARISON:  01/31/2015  FINDINGS: Lungs are hypoinflated with mild volume loss of the right lung unchanged. Stable opacification in the right base likely small effusion/scarring. Cardiomediastinal silhouette and remainder of the exam is unchanged.  IMPRESSION: No active cardiopulmonary disease.  Stable right base opacification likely small effusions/scarring.   Electronically Signed   By: Marin Olp M.D.   On: 06/22/2015 15:02   Ct Head Wo Contrast: 06/22/2015   CLINICAL DATA:  Sudden onset shortness of breath and dizziness 2 p.m. today with arm tingling, history of stroke and atrial fibrillation  EXAM: CT HEAD WITHOUT CONTRAST  TECHNIQUE: Contiguous axial images were obtained from the base of the skull through the vertex without intravenous contrast.  COMPARISON:  07/06/2013  FINDINGS: Moderate diffuse atrophy. Moderate low attenuation in the deep white matter. No evidence of vascular territory infarct. No evidence of mass or hydrocephalus. No hemorrhage or extra-axial fluid. Right vertebral artery calcification and bilateral intracranial internal carotid artery calcification noted. Calvarium intact. Visualized portions of the paranasal sinuses and mastoid air cells clear.  IMPRESSION: Age-related involutional change with no acute findings   Electronically Signed   By: Skipper Cliche M.D.   On: 06/22/2015 16:00   ECG: Atrial Flutter with 2:1 AV Block. Rate 105.   ASSESSMENT AND PLAN  1. Paroxysmal Atrial Fibrillation This patients CHA2DS2-VASc Score and unadjusted Ischemic Stroke Rate (% per year) is equal to 3.2 % stroke rate/year from a score of 3 Above score calculated as 1 point each if present [CHF, HTN, DM,  Vascular=MI/PAD/Aortic Plaque, Age if 65-74, or Male] Above score calculated as 2 points each if present [Age > 75, or Stroke/TIA/TE] - has been in and out of atrial fibrillation, atrial flutter and sinus rhythm overnight and this morning. - Continue Eliquis, Multaq, and Diltiazem. Will arrange follow-up in the atrial fibrillation clinic for further discussion of rate control and possible ablation.  2. Carotid Artery Stenosis - s/p R Carotid Endarterectomy 07/2013 - follows [facility] for L carotid artery stenosis  3. PAD - followed as outpatint by [facility] - on Cilostazol  4. COPD - on Spiriva   Signed, Brittany M Strader, PA-C 06/23/2015, 9:32 AM Pager: [phone]  Patient examined chart reviewed.  Recurrent PAF.  Multaq does not appear to be the right drug For him  Continue eliquis and cardizem.  Will arrange f/u with Doristine Devoid in [place] clinic.  I think He should be seen by Dr Rayann Heman for consideration of ablation.  Cardiac exam normal currently in NSR With episodes of rate controlled PAF.  Lungs clear r/o    Jenkins Rouge

## 2015-06-23 NOTE — Discharge Instructions (Signed)

## 2015-06-28 ENCOUNTER — Encounter (HOSPITAL_COMMUNITY): Payer: Self-pay | Admitting: Nurse Practitioner

## 2015-06-28 ENCOUNTER — Ambulatory Visit (HOSPITAL_COMMUNITY)
Admit: 2015-06-28 | Discharge: 2015-06-28 | Disposition: A | Discharge status: Home / Self Care | Payer: BLUE CROSS/BLUE SHIELD | Source: Ambulatory Visit | Attending: Nurse Practitioner | Admitting: Nurse Practitioner

## 2015-06-28 VITALS — BP 122/60 | HR 68 | Ht 73.0 in | Wt 200.8 lb

## 2015-06-28 DIAGNOSIS — I480 Paroxysmal atrial fibrillation: Secondary | ICD-10-CM

## 2015-06-28 MED ORDER — DILTIAZEM HCL 30 MG PO TABS: ORAL_TABLET | ORAL | Status: DC

## 2015-06-28 NOTE — Patient Instructions (Signed)
Your physician has recommended you make the following change in your medication:  1)cardizem '30mg'$  -- take 1 tablet every 4 hours AS NEEDED for HR >100 as long as BP> 100  Scheduler will be in touch with you regarding sleep study and appointment with [name]

## 2015-06-28 NOTE — Progress Notes (Signed)
Patient ID: Troy Kenner [name], male   DOB: Feb 10, 1941, 74 y.o.   MRN: [id number]    Primary Care Physician: [name], MD Referring Physician: Dr. Darien Ramus [name] is a 74 y.o. male with a h/o HTN, HLD,  COPD, carotid stenosis (status post right CEA and refused Left CEA- per daughter), atrial fibrillation on diltiazem CD, Multaq & Eliquis presented to the [facility] on 06/22/15 with subacute/acute onset of dizziness, lightheadedness, clamminess, feeling like he was going to pass out but denied chest pain or palpitations. He had some dyspnea. This happened while he was leaving work and walking in the car park at approximately 2 PM on 9/21. He had 2 episodes of nonbloody emesis. His son-in-law brought him to the ED where apparently he had a heart rate up in the 140s. He was admitted to telemetry where he demonstrated paroxysmal A. fib. His symptoms resolved after lasting for 6-8 hours. Cardiology consulted this morning. He has recurrent symptomatic PAF and Multaq does not appear to be controlling his A. fib. Discussed with Dr. Johnsie Cancel who recommends discharging him on prior home medications including Cardizem CD, Multaq and Eliquis.  He was referred to afib clinic to further discuss management of afib. Has had h/o PAF x 2 years ago. Has been on multaq x one year but has had more breakthrough episodes recently from which he becoming very symptomatic.He has been in SR since discharge. Discussed lifestyle risk factors and he reports that he already goes to the gym couple of times a week and works with a Clinical research associate. Stopped smoking two years ago. He had been abstaining from alcohol in the past but started drinking some wine over the last month. Does snore according to the wife. Has never had a sleep study.  Discussed options. ablation vrs other AAD's, with pt today and he states he as already made up his mind that he would like to have the ablation but is going on a hunting trip in [place]  end of October and it will have be after his trip.  Today, he denies symptoms of palpitations, chest pain, shortness of breath, orthopnea, PND, lower extremity edema, dizziness, presyncope, syncope, or neurologic sequela. The patient is tolerating medications without difficulties and is otherwise without complaint today.   Past Medical History  Diagnosis Date  . Hypertension   . Hyperlipidemia   . Glaucoma BOTH EYES    Dr Gershon [place]  . Hx of adenomatous colonic polyps 2005     X 2; 1 hyperplastic polyp; Dr Olevia Perches  . Impaired fasting glucose 2007    108; A1c5.4%  . History of lung cancer APRIL 2012  SQUAMOUS CELL---- S/P RIGHT LOWER LOBECTOMY AT [facility] --  NO CHEMORADIATION---  NO RECURRENCE     ONCOLOGIST- DR Tressie Stalker  LOV IN [facility] 10-27-2012  . GERD (gastroesophageal reflux disease)   . BPH (benign prostatic hypertrophy)   . Frequency of urination   . Urgency of urination   . Nocturia   . Lesion of bladder   . Microhematuria   . Arthritis   . History of basal cell carcinoma excision   . Exertional dyspnea   . Peripheral vascular disease S/P ANGIOPLASTY AND STENTING    FOLLOWED  BY DR [name]  . PAD (peripheral artery disease) ABI'S  JAN 2014  0.65 ON RIGHT ;  1.04 ON LEFT  . AAA (abdominal aortic aneurysm) LAST ABD. [name]  JAN 2014  3.4CM    MONITORED BY DR [name]  .  Bilateral carotid artery stenosis DUPLEX 12-29-2012  BY DR [facility]    BILATERAL ICA STENOSIS 60-79%  . COPD (chronic obstructive pulmonary disease)   . Stroke Jul 07, 2013    mini  TIA  . Cancer 01-25-12    LUNG  . Atrial fibrillation   . Spinal stenosis Sept. 2015   Past Surgical History  Procedure Laterality Date  . Angio plasty      X 4 in legs  . Colon polyectomy    . Trabecular surgery      OS  . Lung lobectomy  01/24/2011     RIGHT UPPER LOBE  (SQUAMOUS CELL CARCINOMA) Dr Dorthea Cove , [facility]. No chemotherapyor radiation  . Laryngoscopy  06-27-2004    BX VOCAL CORD  (LEUKOPLAKIA)  PER PT NO ISSUES SINCE  .  Cataract extraction w/ intraocular lens  implant, bilateral    . Basal cell carcinoma excision      MULTIPLE TIMES--  RIGHT FOREARM, CHEEKS, AND BACK  . Femoral-popliteal bypass graft Left 1994  [facility]    AND 2001  IN [place]  . Cardiovascular stress test  12-08-2012  DR [facility]    NORMAL LEXISCAN WITH NO EXERCISE NUCLEAR STUDY/ EF 66%/   NO ISCHEMIA/ NO SIGNIFICANT CHANGE FROM PRIOR STUDY  . Transthoracic echocardiogram  12-29-2012  DR [facility]    MILD LVH/  LVSF NORMAL/ EF 62-70%/  GRADE I DIASTOLIC DYSFUNCTION  . Aortogram  07-27-2002    MILD DIFFUSE ILIAC ARTERY OCCLUSIVE DISEASE /  LEFT RENAL ARTERY 20%/ PATENT LEFT FEM-POP GRAFT/ MILD SFA AND POPLITEAL ARTERY OCCLUSIVE DISEASE W/ SEVERE KIDNEY OCCLUSIVE DISEASE  . Cystoscopy w/ retrogrades Bilateral 01/21/2013    Procedure: CYSTOSCOPY WITH RETROGRADE PYELOGRAM;  Surgeon: Molli Hazard, MD;  Location: [facility];  Service: Urology;  Laterality: Bilateral;   CYSTO, BLADDER BIOPSY, BILATERAL RETROGRADE PYELOGRAM  RAD TECH FROM RADIOLOGY PER JOY  . Cystoscopy with biopsy N/A 01/21/2013    Procedure: CYSTOSCOPY WITH BIOPSY;  Surgeon: Molli Hazard, MD;  Location: [facility];  Service: Urology;  Laterality: N/A;  . Endarterectomy Right 07/14/2013    Procedure: ENDARTERECTOMY CAROTID;  Surgeon: Angelia Mould, MD;  Location: [place];  Service: Vascular;  Laterality: Right;  . Patch angioplasty Right 07/14/2013    Procedure: PATCH ANGIOPLASTY;  Surgeon: Angelia Mould, MD;  [facility] OR;  Service: Vascular;  Laterality: Right;  . Carotid endarterectomy Right 07-14-13    cea  . Carotid angiogram N/A 07/10/2013    Procedure: CAROTID ANGIOGRAM;  Surgeon: Elam Dutch, MD;  Location[facility] CATH LAB;  Service: Cardiovascular;  Laterality: N/A;    Current Outpatient Prescriptions  Medication Sig Dispense Refill  . bimatoprost (LUMIGAN) 0.03 % ophthalmic solution Place 1 drop into  the left eye at bedtime.     [name] BIOTIN PO Take 5,000 mcg by mouth daily.    . Bisacodyl (DULCOLAX PO) Take by mouth as needed.    . brimonidine (ALPHAGAN P) 0.1 % SOLN Place 3 drops into the left eye 3 (three) times daily.     . cilostazol (PLETAL) 100 MG tablet Take 1 tablet (100 mg total) by mouth 2 (two) times daily. 180 tablet 1  . CRESTOR 40 MG tablet TAKE 1 TABLET ONCE DAILY. (Patient taking differently: TAKE 40 MG BY MOUTH DAILY) 90 tablet 0  . diltiazem (CARDIZEM CD) 120 MG 24 hr capsule TAKE 1 CAPSULE DAILY. (Patient taking differently: TAKE 120 MG BY MOUTH DAILY) 30  capsule 3  . dorzolamide-timolol (COSOPT) 22.3-6.8 MG/ML ophthalmic solution Place 1 drop into the left eye 3 (three) times daily.     [name] doxazosin (CARDURA) 4 MG tablet TAKE (1/2) TABLET DAILY. (Patient taking differently: Take 2 mg by mouth daily. ) 45 tablet 0  . ELIQUIS 5 MG TABS tablet TAKE 1 TABLET TWICE DAILY. (Patient taking differently: TAKE 5 MG BY MOUTH TWICE DAILY) 60 tablet 3  . FLUoxetine (PROZAC) 20 MG capsule TAKE (1) CAPSULE DAILY. (Patient taking differently: TAKE 20 MG BY MOUTH DAILY) 90 capsule 1  . Ginkgo Biloba 40 MG TABS Take 40 mg by mouth daily. daily    . IRON PO Take 1 tablet by mouth daily.    [name] MELATONIN PO Take 3 mg by mouth at bedtime as needed (sleep).     . MULTAQ 400 MG tablet TAKE 1 TABLET TWICE DAILY WITH A MEAL. (Patient taking differently: TAKE 400 MG BY MOUTH TWICE DAILY WITH A MEAL) 60 tablet 3  . Multiple Vitamins-Minerals (CENTRUM SILVER PO) Take 1 tablet by mouth daily.    . ranitidine (ZANTAC) 150 MG tablet Take 150 mg by mouth as needed for heartburn.    . diltiazem (CARDIZEM) 30 MG tablet Take 1 tablet every 4 hours AS NEEDED for HR >100 BP >100 30 tablet 1   No current facility-administered medications for this encounter.    Allergies  Allergen Reactions  . Penicillins Hives and Shortness Of Breath    Flushing  & dyspnea Because of a history of documented adverse serious  drug reaction;Medi Alert bracelet  is recommended PCN reaction causing immediate rash, facial/tongue/throat swelling, SOB or lightheadedness with hypotension: Yes PCN reaction causing severe rash involving mucus membranes or skin necrosis: No PCN reaction occurring within the last 10 years: NO PCN reaction that required hospitalization: NO  . Sulfonamide Derivatives Shortness Of Breath    Flushing & dyspnea Because of a history of documented adverse serious drug reaction;Medi Alert bracelet  is recommended  . Niacin-Lovastatin Er Other (See Comments)    Dyspnea, flushing  . Sulfa Antibiotics Hives  . Atorvastatin Other (See Comments)    Myalgias & athralgias- takes Crestor    Social History   Social History  . Marital Status: Married    Spouse Name: [name]   . Number of Children: 3  . Years of Education: 12+   Occupational History  . Not on file.   Social History Main Topics  . Smoking status: Former Smoker -- 0.50 packs/day for 50 years    Types: Cigarettes    Quit date: 07/28/2013  . Smokeless tobacco: Never Used     Comment: pt states that he is now taking chantix  . Alcohol Use: Yes     Comment:  socially, variable  . Drug Use: No  . Sexual Activity: Not on file   Other Topics Concern  . Not on file   Social History Narrative   Patient lives at home with spouse [name]   Patient has 3 children    Patient is right handed     Family History  Problem Relation Age of Onset  . Stroke Mother     mini strokes  . Alcohol abuse Father   . Heart disease Father     MI after 43  . Stroke Father   . Hypertension Father   . Heart attack Father   . Heart disease Paternal Aunt     several  . Hypertension Paternal Aunt  several  . Stroke Paternal Aunt     several  . Stroke Paternal Uncle     several  . Heart disease Paternal Uncle     several;2 had MI pre 13    ROS- All systems are reviewed and negative except as per the HPI above  Physical Exam: Filed Vitals:     06/28/15 0843  BP: 122/60  Pulse: 68  Height: '6\' 1"'$  (1.854 m)  Weight: 200 lb 12.8 oz (91.082 kg)    GEN- The patient is well appearing, alert and oriented x 3 today.   Head- normocephalic, atraumatic Eyes-  Sclera clear, conjunctiva pink Ears- hearing intact Oropharynx- clear Neck- supple, no JVP, left carotid bruitLymph- no cervical lymphadenopathy Lungs- Clear to ausculation bilaterally, normal work of breathing Heart- Regular rate and rhythm, no murmurs, rubs or gallops, PMI not laterally displaced GI- soft, NT, ND, + BS Extremities- no clubbing, cyanosis, or edema MS- no significant deformity or atrophy Skin- no rash or lesion Psych- euthymic mood, full affect Neuro- strength and sensation are intact  EKG- NSR, normal EKG, v rate 68 bpm, PR int 174 ms, QRS 72 ms,QTc 418 ms.  Epic records reviewed  Echo-Left ventricle: The cavity size was normal. Wall thickness was normal. Systolic function was normal. The estimated ejection fraction was in the range of 60% to 65%. Wall motion was normal; there were no regional wall motion abnormalities. LA 34 ms  Assessment and Plan: 1. Symptomatic PAF Recent breakthrough episodes on multaq Therapeutic strategies for afib including medicine and ablation were discussed in detail with the patient today. He would prefer ablation over trying other antiarrythmic's. Appointment will be made with [name] to further discuss. Continue apixaban without missed doses. Continue multaq for now.  2. Risk factor modification Reduce alcohol use to no more than two drinks a week Continue regular exercise Sleep study ordered  Butch Penny C. Carroll, [facility] [address] [place], [place] [address] [phone]

## 2015-06-30 ENCOUNTER — Ambulatory Visit: Payer: BLUE CROSS/BLUE SHIELD | Admitting: Cardiology

## 2015-07-07 ENCOUNTER — Other Ambulatory Visit: Payer: Self-pay | Admitting: Cardiology

## 2015-07-11 ENCOUNTER — Other Ambulatory Visit: Payer: Self-pay | Admitting: *Deleted

## 2015-07-11 DIAGNOSIS — R0683 Snoring: Secondary | ICD-10-CM

## 2015-07-11 DIAGNOSIS — I4891 Unspecified atrial fibrillation: Secondary | ICD-10-CM

## 2015-07-15 ENCOUNTER — Telehealth: Payer: Self-pay | Admitting: Cardiology

## 2015-07-15 NOTE — Telephone Encounter (Signed)
New Message  Pt wife called to confirm sleep appt and get more information pertaining to appt. Please call back and discuss.

## 2015-07-15 NOTE — Telephone Encounter (Signed)
Spoke with patient's wife to confirm date.

## 2015-07-24 ENCOUNTER — Other Ambulatory Visit: Payer: Self-pay | Admitting: Internal Medicine

## 2015-07-25 ENCOUNTER — Other Ambulatory Visit: Payer: Self-pay | Admitting: Emergency Medicine

## 2015-07-25 ENCOUNTER — Other Ambulatory Visit: Payer: Self-pay | Admitting: Cardiology

## 2015-07-25 MED ORDER — DOXAZOSIN MESYLATE 4 MG PO TABS: ORAL_TABLET | ORAL | Status: DC

## 2015-07-27 ENCOUNTER — Encounter: Payer: Self-pay | Admitting: Internal Medicine

## 2015-07-27 ENCOUNTER — Ambulatory Visit (INDEPENDENT_AMBULATORY_CARE_PROVIDER_SITE_OTHER): Payer: BLUE CROSS/BLUE SHIELD | Admitting: Internal Medicine

## 2015-07-27 ENCOUNTER — Telehealth: Payer: Self-pay | Admitting: Internal Medicine

## 2015-07-27 VITALS — BP 122/78 | HR 61 | Ht 73.0 in | Wt 202.4 lb

## 2015-07-27 DIAGNOSIS — I6523 Occlusion and stenosis of bilateral carotid arteries: Secondary | ICD-10-CM

## 2015-07-27 DIAGNOSIS — I480 Paroxysmal atrial fibrillation: Secondary | ICD-10-CM | POA: Diagnosis not present

## 2015-07-27 DIAGNOSIS — R2681 Unsteadiness on feet: Secondary | ICD-10-CM

## 2015-07-27 DIAGNOSIS — I739 Peripheral vascular disease, unspecified: Secondary | ICD-10-CM | POA: Diagnosis not present

## 2015-07-27 DIAGNOSIS — G459 Transient cerebral ischemic attack, unspecified: Secondary | ICD-10-CM | POA: Diagnosis not present

## 2015-07-27 LAB — BASIC METABOLIC PANEL
BUN: 12 mg/dL (ref 7–25)
CHLORIDE: 104 mmol/L (ref 98–110)
CO2: 24 mmol/L (ref 20–31)
Calcium: 9.7 mg/dL (ref 8.6–10.3)
Creat: 1.23 mg/dL — ABNORMAL HIGH (ref 0.70–1.18)
Glucose, Bld: 105 mg/dL — ABNORMAL HIGH (ref 65–99)
POTASSIUM: 4.7 mmol/L (ref 3.5–5.3)
SODIUM: 139 mmol/L (ref 135–146)

## 2015-07-27 NOTE — Telephone Encounter (Signed)
New message      Talk to the nurse regarding patient's nov 9th hosp appt.  Wife is concerned that he may/may not be out in time for his nov 9th 9am appt.  Please call.

## 2015-07-27 NOTE — Telephone Encounter (Signed)
Called and moved the [facility] implant to Fri 08/12/15 at 7:30am  Check in at [facility] at 6:30am

## 2015-07-27 NOTE — Patient Instructions (Addendum)
Medication Instructions:  Your physician recommends that you continue on your current medications as directed. Please refer to the Current Medication list given to you today.   Labwork: Your physician recommends that you return for lab work today:BMP     Testing/Procedures: MRI/MRA of the brain w/wo gadolinium   LINQ implant after test---08/10/15  Check in at [place] at 7:30am.     Follow-Up: Your physician recommends that you schedule a follow-up appointment in: 10-14 days from [facility] implant in the device clinic for wound check   Any Other Special Instructions Will Be Listed Below (If Applicable).     If you need a refill on your cardiac medications before your next appointment, please call your pharmacy.

## 2015-07-27 NOTE — Progress Notes (Signed)
Electrophysiology Office Note   Date:  07/27/2015   ID:  Troy Kenner [name], DOB 05/11/1941, MRN [id number]  PCP:  [name], MD  Cardiologist:  [name] Primary Electrophysiologist: [name]    Chief Complaint  Patient presents with  . PAF     History of Present Illness: Troy Streight [name] is a 74 y.o. male who presents today for electrophysiology evaluation secondary to his PAfib.  He has PMHx ofHTN, HLD, COPD, carotid stenosis status post right CEA in 2014 after what he states was a TIA.  He was seen in the ER September with an episode of dizziness where he was unable to ambulate well, felt unsteady on his feet describes stumbling but no falls, he was helped by some people that saw him in the parking lot and eventually brought to the hospital.  HE felt like he knew he was in Afib, no CP, no SOB, he was found to be in AFib with RVR 140's in the ER.  He does not feel like he has had any exacerbations since that time.  He reports that he infrequently does miss a dose of his medicine, including the Eliquis, but states takes it as directed at least 95% of the time.   Today, he denies symptoms of palpitations, chest pain, shortness of breath, orthopnea, PND, lower extremity edema, claudication, dizziness, presyncope, syncope, bleeding, or neurologic sequela. The patient is tolerating medications without difficulties and is otherwise without complaint today.    Past Medical History  Diagnosis Date  . Hypertension   . Hyperlipidemia   . Glaucoma BOTH EYES    Dr Gershon [place]  . Hx of adenomatous colonic polyps 2005     X 2; 1 hyperplastic polyp; Dr Olevia Perches  . Impaired fasting glucose 2007    108; A1c5.4%  . History of lung cancer APRIL 2012  SQUAMOUS CELL---- S/P RIGHT LOWER LOBECTOMY AT [facility] --  NO CHEMORADIATION---  NO RECURRENCE     ONCOLOGIST- DR Tressie Stalker  LOV IN [facility] 10-27-2012  . GERD (gastroesophageal reflux disease)   . BPH (benign prostatic hypertrophy)   . Frequency of  urination   . Urgency of urination   . Nocturia   . Lesion of bladder   . Microhematuria   . Arthritis   . History of basal cell carcinoma excision   . Exertional dyspnea   . Peripheral vascular disease ([place]) S/P ANGIOPLASTY AND STENTING    FOLLOWED  BY DR [name]  . PAD (peripheral artery disease) ([place]) ABI'S  JAN 2014  0.65 ON RIGHT ;  1.04 ON LEFT  . AAA (abdominal aortic aneurysm) ([facility]) LAST ABD. [name]  JAN 2014  3.4CM    MONITORED BY DR [name]  . Bilateral carotid artery stenosis DUPLEX 12-29-2012  BY DR [facility]    BILATERAL ICA STENOSIS 60-79%  . COPD (chronic obstructive pulmonary disease) ([place])   . Stroke [facility]) Jul 07, 2013    mini  TIA  . Cancer ([place]) 01-25-12    LUNG  . Atrial fibrillation ([place])   . Spinal stenosis Sept. 2015   Past Surgical History  Procedure Laterality Date  . Angio plasty      X 4 in legs  . Colon polyectomy    . Trabecular surgery      OS  . Lung lobectomy  01/24/2011     RIGHT UPPER LOBE  (SQUAMOUS CELL CARCINOMA) Dr Dorthea Cove , [facility]. No chemotherapyor radiation  . Laryngoscopy  06-27-2004    BX VOCAL CORD  (  LEUKOPLAKIA)  PER PT NO ISSUES SINCE  . Cataract extraction w/ intraocular lens  implant, bilateral    . Basal cell carcinoma excision      MULTIPLE TIMES--  RIGHT FOREARM, CHEEKS, AND BACK  . Femoral-popliteal bypass graft Left 1994  [facility]    AND 2001  [place] [address]  . Cardiovascular stress test  12-08-2012  DR [name]    NORMAL LEXISCAN WITH NO EXERCISE NUCLEAR STUDY/ EF 66%/   NO ISCHEMIA/ NO SIGNIFICANT CHANGE FROM PRIOR STUDY  . Transthoracic echocardiogram  12-29-2012  DR [facility]    MILD LVH/  LVSF NORMAL/ EF 19-50%/  GRADE I DIASTOLIC DYSFUNCTION  . Aortogram  07-27-2002    MILD DIFFUSE ILIAC ARTERY OCCLUSIVE DISEASE /  LEFT RENAL ARTERY 20%/ PATENT LEFT FEM-POP GRAFT/ MILD SFA AND POPLITEAL ARTERY OCCLUSIVE DISEASE W/ SEVERE KIDNEY OCCLUSIVE DISEASE  . Cystoscopy w/ retrogrades Bilateral 01/21/2013    Procedure: CYSTOSCOPY WITH  RETROGRADE PYELOGRAM;  Surgeon: Molli Hazard, MD;  Location: [facility];  Service: Urology;  Laterality: Bilateral;   CYSTO, BLADDER BIOPSY, BILATERAL RETROGRADE PYELOGRAM  RAD TECH FROM RADIOLOGY PER JOY  . Cystoscopy with biopsy N/A 01/21/2013    Procedure: CYSTOSCOPY WITH BIOPSY;  Surgeon: Molli Hazard, MD;  Location: [facility];  Service: Urology;  Laterality: N/A;  . Endarterectomy Right 07/14/2013    Procedure: ENDARTERECTOMY CAROTID;  Surgeon: Angelia Mould, MD;  Location: [place];  Service: Vascular;  Laterality: Right;  . Patch angioplasty Right 07/14/2013    Procedure: PATCH ANGIOPLASTY;  Surgeon: Angelia Mould, MD;  Location: [facility] OR;  Service: Vascular;  Laterality: Right;  . Carotid endarterectomy Right 07-14-13    cea  . Carotid angiogram N/A 07/10/2013    Procedure: CAROTID ANGIOGRAM;  Surgeon: Elam Dutch, MD;  Location: [facility] CATH LAB;  Service: Cardiovascular;  Laterality: N/A;     Current Outpatient Prescriptions  Medication Sig Dispense Refill  . apixaban (ELIQUIS) 5 MG TABS tablet Take 5 mg by mouth 2 (two) times daily.    . bimatoprost (LUMIGAN) 0.03 % ophthalmic solution Place 1 drop into the left eye at bedtime.     [name] BIOTIN PO Take 5,000 mcg by mouth daily.    . Bisacodyl (DULCOLAX PO) Take 1 capsule by mouth daily as needed (constipation).     . brimonidine (ALPHAGAN P) 0.1 % SOLN Place 3 drops into the left eye 3 (three) times daily.     . cilostazol (PLETAL) 100 MG tablet Take 100 mg by mouth 2 (two) times daily.    [name] diltiazem (CARDIZEM CD) 120 MG 24 hr capsule Take 120 mg by mouth daily.    [name] diltiazem (CARDIZEM) 30 MG tablet Take 1 tablet by mouth every 4 hours AS NEEDED for HR >100 BP >100    . dorzolamide-timolol (COSOPT) 22.3-6.8 MG/ML ophthalmic solution Place 1 drop into the left eye 3 (three) times daily.     [name] doxazosin (CARDURA) 4 MG tablet Take 2 mg by mouth daily.    [name] dronedarone  (MULTAQ) 400 MG tablet Take 400 mg by mouth 2 (two) times daily with a meal.    . FLUoxetine (PROZAC) 20 MG capsule Take 20 mg by mouth daily.    . Ginkgo Biloba 40 MG TABS Take 40 mg by mouth daily. daily    . IRON PO Take 1 tablet by mouth daily.    [name] MELATONIN PO Take 3 mg by mouth at bedtime as needed (  sleep).     . Multiple Vitamins-Minerals (CENTRUM SILVER PO) Take 1 tablet by mouth daily.    . ranitidine (ZANTAC) 150 MG tablet Take 150 mg by mouth daily as needed for heartburn.     . rosuvastatin (CRESTOR) 40 MG tablet Take 40 mg by mouth daily.     No current facility-administered medications for this visit.    Allergies:   Penicillins; Sulfonamide derivatives; Niacin-lovastatin er; Sulfa antibiotics; and Atorvastatin   Social History:  The patient  reports that he quit smoking about 1 years ago. His smoking use included Cigarettes. He has a 25 pack-year smoking history. He has never used smokeless tobacco. He reports that he drinks alcohol. He reports that he does not use illicit drugs.   Family History:  The patient's family history includes Alcohol abuse in his father; Heart attack in his father; Heart disease in his father, paternal aunt, and paternal uncle; Hypertension in his father and paternal aunt; Stroke in his father, mother, paternal aunt, and paternal uncle.    ROS:  Please see the history of present illness.   All other systems are reviewed and negative.    PHYSICAL EXAM: VS:  BP 122/78 mmHg  Pulse 61  Ht '6\' 1"'$  (1.854 m)  Wt 202 lb 6.4 oz (91.808 kg)  BMI 26.71 kg/m2 , BMI Body mass index is 26.71 kg/(m^2). GEN: Well nourished, well developed, in no acute distress HEENT: normal Neck: no JVD, carotid bruits, or masses Cardiac: RRR; no murmurs, rubs, or gallops,no edema  Respiratory:  clear to auscultation bilaterally, normal work of breathing GI: soft, nontender, nondistended, + BS MS: no deformity or atrophy Skin: warm and dry  Neuro:  Strength and sensation  are intact Psych: euthymic mood, full affect  EKG:  EKG is ordered today. The ekg ordered today shows SR  02/15/15: Echocardiogram Study Conclusions - Left ventricle: The cavity size was normal. Wall thickness was normal. Systolic function was normal. The estimated ejection fraction was in the range of 60% to 65%. Wall motion was normal; there were no regional wall motion abnormalities. Impressions: - The image quality is poor.    Recent Labs: 02/08/2015: ALT 18; TSH 1.24 06/23/2015: BUN 16; Creatinine, Ser 1.24; Hemoglobin 12.2*; Platelets 235; Potassium 4.1; Sodium 139    Lipid Panel     Component Value Date/Time   CHOL 149 02/08/2015 0842   TRIG 106.0 02/08/2015 0842   TRIG 97 10/11/2006 1129   HDL 42.00 02/08/2015 0842   CHOLHDL 4 02/08/2015 0842   CHOLHDL 4.0 CALC 10/11/2006 1129   VLDL 21.2 02/08/2015 0842   LDLCALC 86 02/08/2015 0842     Wt Readings from Last 3 Encounters:  07/27/15 202 lb 6.4 oz (91.808 kg)  06/28/15 200 lb 12.8 oz (91.082 kg)  06/22/15 196 lb 13.9 oz (89.3 kg)      Other studies Reviewed: Additional studies/ records that were reviewed today include: ER record, CT scan, notes, last Corotid [name]   ASSESSMENT AND PLAN:  1.  PAFib      CHADS2-Vasc is at least 4 (he denies HTN) on Eliquis and Multaq      The patient is counseled on the importance of medication compliance, life style adjustments, such as ETOH.  He is pending a sleep study.       I am not certain as to how much afib he is having.  A better understanding of his AF burden would be helpful to guide further management. Risks, benefits, and alternatives to  implantable loop recorder placement were discussed with the patient who wishes to proceed. We will then have him return in a month to decide on medical options depending on AF burden and symptoms.  2. Symptoms of dizziness/ unsteadiness in September concerning for TIA.      Will order MRI/MRA of brain with intra/extra cranial  vessels to further evaluate     Compliance with anticoagulation also discussed today  3. PVD Hx of R CEA     Follows with vascular, pending testing and visit in a couple weeks  Follow-up:  56mo sooner if neeeded  Current medicines are reviewed at length with the patient today.   The patient does not have concerns regarding his medicines.  The following changes were made today:  none  Labs/ tests ordered today include:  Orders Placed This Encounter  Procedures  . MR MRA HEAD W WO CONTRAST  . EKG 12-Lead   Today, I have spent 40 minutes with the patient discussing atrial fibrillation .  More than 50% of the visit time today was spent on this issue.    SArmy FossaMD  07/27/2015 9:16 AM     [facility] [address]reens[place] [phone]) [phone](fax)

## 2015-07-28 ENCOUNTER — Encounter: Payer: Self-pay | Admitting: Internal Medicine

## 2015-07-29 ENCOUNTER — Ambulatory Visit (HOSPITAL_COMMUNITY)
Admission: RE | Admit: 2015-07-29 | Discharge: 2015-07-29 | Disposition: A | Discharge status: Home / Self Care | Payer: BLUE CROSS/BLUE SHIELD | Source: Ambulatory Visit | Attending: Internal Medicine | Admitting: Internal Medicine

## 2015-07-29 ENCOUNTER — Other Ambulatory Visit: Payer: Self-pay | Admitting: Internal Medicine

## 2015-07-29 DIAGNOSIS — R2681 Unsteadiness on feet: Secondary | ICD-10-CM

## 2015-07-29 DIAGNOSIS — M2548 Effusion, other site: Secondary | ICD-10-CM | POA: Diagnosis not present

## 2015-07-29 DIAGNOSIS — I6523 Occlusion and stenosis of bilateral carotid arteries: Secondary | ICD-10-CM | POA: Diagnosis not present

## 2015-07-29 DIAGNOSIS — R42 Dizziness and giddiness: Secondary | ICD-10-CM | POA: Diagnosis not present

## 2015-07-29 MED ORDER — GADOBENATE DIMEGLUMINE 529 MG/ML IV SOLN
20.0000 mL | Freq: Once | INTRAVENOUS | Status: AC | PRN
  Administered 2015-07-29: 18 mL via INTRAVENOUS

## 2015-08-08 ENCOUNTER — Other Ambulatory Visit: Payer: Self-pay | Admitting: Cardiology

## 2015-08-09 ENCOUNTER — Encounter: Payer: Self-pay | Admitting: Internal Medicine

## 2015-08-10 ENCOUNTER — Ambulatory Visit: Payer: BC Managed Care – PPO | Admitting: Family

## 2015-08-10 ENCOUNTER — Ambulatory Visit (INDEPENDENT_AMBULATORY_CARE_PROVIDER_SITE_OTHER)
Admission: RE | Admit: 2015-08-10 | Discharge: 2015-08-10 | Disposition: A | Discharge status: Home / Self Care | Payer: BLUE CROSS/BLUE SHIELD | Source: Ambulatory Visit | Attending: Family | Admitting: Family

## 2015-08-10 ENCOUNTER — Other Ambulatory Visit: Payer: Self-pay | Admitting: Family

## 2015-08-10 ENCOUNTER — Other Ambulatory Visit (HOSPITAL_COMMUNITY): Payer: BC Managed Care – PPO

## 2015-08-10 ENCOUNTER — Ambulatory Visit (HOSPITAL_COMMUNITY)
Admission: RE | Admit: 2015-08-10 | Discharge: 2015-08-10 | Disposition: A | Discharge status: Home / Self Care | Payer: BLUE CROSS/BLUE SHIELD | Source: Ambulatory Visit | Attending: Family | Admitting: Family

## 2015-08-10 DIAGNOSIS — Z95828 Presence of other vascular implants and grafts: Secondary | ICD-10-CM | POA: Diagnosis not present

## 2015-08-10 DIAGNOSIS — I714 Abdominal aortic aneurysm, without rupture, unspecified: Secondary | ICD-10-CM

## 2015-08-10 DIAGNOSIS — I6523 Occlusion and stenosis of bilateral carotid arteries: Secondary | ICD-10-CM | POA: Diagnosis not present

## 2015-08-10 DIAGNOSIS — Z87891 Personal history of nicotine dependence: Secondary | ICD-10-CM | POA: Diagnosis not present

## 2015-08-12 ENCOUNTER — Ambulatory Visit (HOSPITAL_COMMUNITY)
Admission: RE | Admit: 2015-08-12 | Discharge: 2015-08-12 | Disposition: A | Discharge status: Home / Self Care | Payer: BLUE CROSS/BLUE SHIELD | Source: Ambulatory Visit | Attending: Internal Medicine | Admitting: Internal Medicine

## 2015-08-12 ENCOUNTER — Encounter (HOSPITAL_COMMUNITY)
Admission: RE | Disposition: A | Discharge status: Home / Self Care | Payer: Self-pay | Source: Ambulatory Visit | Attending: Internal Medicine

## 2015-08-12 DIAGNOSIS — R3915 Urgency of urination: Secondary | ICD-10-CM | POA: Diagnosis not present

## 2015-08-12 DIAGNOSIS — Z882 Allergy status to sulfonamides status: Secondary | ICD-10-CM | POA: Diagnosis not present

## 2015-08-12 DIAGNOSIS — Z7901 Long term (current) use of anticoagulants: Secondary | ICD-10-CM | POA: Diagnosis not present

## 2015-08-12 DIAGNOSIS — N401 Enlarged prostate with lower urinary tract symptoms: Secondary | ICD-10-CM | POA: Diagnosis not present

## 2015-08-12 DIAGNOSIS — I10 Essential (primary) hypertension: Secondary | ICD-10-CM | POA: Insufficient documentation

## 2015-08-12 DIAGNOSIS — K219 Gastro-esophageal reflux disease without esophagitis: Secondary | ICD-10-CM | POA: Insufficient documentation

## 2015-08-12 DIAGNOSIS — I6523 Occlusion and stenosis of bilateral carotid arteries: Secondary | ICD-10-CM | POA: Insufficient documentation

## 2015-08-12 DIAGNOSIS — R350 Frequency of micturition: Secondary | ICD-10-CM | POA: Insufficient documentation

## 2015-08-12 DIAGNOSIS — I739 Peripheral vascular disease, unspecified: Secondary | ICD-10-CM | POA: Diagnosis not present

## 2015-08-12 DIAGNOSIS — Z8673 Personal history of transient ischemic attack (TIA), and cerebral infarction without residual deficits: Secondary | ICD-10-CM | POA: Insufficient documentation

## 2015-08-12 DIAGNOSIS — Z87891 Personal history of nicotine dependence: Secondary | ICD-10-CM | POA: Insufficient documentation

## 2015-08-12 DIAGNOSIS — J449 Chronic obstructive pulmonary disease, unspecified: Secondary | ICD-10-CM | POA: Insufficient documentation

## 2015-08-12 DIAGNOSIS — R351 Nocturia: Secondary | ICD-10-CM | POA: Insufficient documentation

## 2015-08-12 DIAGNOSIS — Z8249 Family history of ischemic heart disease and other diseases of the circulatory system: Secondary | ICD-10-CM | POA: Diagnosis not present

## 2015-08-12 DIAGNOSIS — E785 Hyperlipidemia, unspecified: Secondary | ICD-10-CM | POA: Diagnosis not present

## 2015-08-12 DIAGNOSIS — I480 Paroxysmal atrial fibrillation: Secondary | ICD-10-CM | POA: Diagnosis present

## 2015-08-12 DIAGNOSIS — R3129 Other microscopic hematuria: Secondary | ICD-10-CM | POA: Diagnosis not present

## 2015-08-12 DIAGNOSIS — Z880 Allergy status to penicillin: Secondary | ICD-10-CM | POA: Insufficient documentation

## 2015-08-12 HISTORY — PX: EP IMPLANTABLE DEVICE: SHX172B

## 2015-08-12 SURGERY — LOOP RECORDER INSERTION

## 2015-08-12 MED ORDER — LIDOCAINE-EPINEPHRINE 1 %-1:100000 IJ SOLN
INTRAMUSCULAR | Status: DC | PRN
  Administered 2015-08-12: 10 mL

## 2015-08-12 MED ORDER — LIDOCAINE-EPINEPHRINE 1 %-1:100000 IJ SOLN
INTRAMUSCULAR | Status: AC
  Filled 2015-08-12: qty 1

## 2015-08-12 SURGICAL SUPPLY — 2 items
LOOP REVEAL LINQSYS (Prosthesis & Implant Heart) ×1 IMPLANT
PACK LOOP INSERTION (CUSTOM PROCEDURE TRAY) ×2 IMPLANT

## 2015-08-12 NOTE — H&P (View-Only) (Signed)
Electrophysiology Office Note   Date:  07/27/2015   ID:  Troy Kenner [name], DOB [birth year], MRN [id number]  PCP:  [name], MD  Cardiologist:  [name] Primary Electrophysiologist: [name]    Chief Complaint  Patient presents with  . PAF     History of Present Illness: Troy Caniglia [name] is a 74 y.o. male who presents today for electrophysiology evaluation secondary to his PAfib.  He has PMHx ofHTN, HLD, COPD, carotid stenosis status post right CEA in 2014 after what he states was a TIA.  He was seen in the ER September with an episode of dizziness where he was unable to ambulate well, felt unsteady on his feet describes stumbling but no falls, he was helped by some people that saw him in the parking lot and eventually brought to the hospital.  HE felt like he knew he was in Afib, no CP, no SOB, he was found to be in AFib with RVR 140's in the ER.  He does not feel like he has had any exacerbations since that time.  He reports that he infrequently does miss a dose of his medicine, including the Eliquis, but states takes it as directed at least 95% of the time.   Today, he denies symptoms of palpitations, chest pain, shortness of breath, orthopnea, PND, lower extremity edema, claudication, dizziness, presyncope, syncope, bleeding, or neurologic sequela. The patient is tolerating medications without difficulties and is otherwise without complaint today.    Past Medical History  Diagnosis Date  . Hypertension   . Hyperlipidemia   . Glaucoma BOTH EYES    Dr Gershon Crane  . Hx of adenomatous colonic polyps 2005     X 2; 1 hyperplastic polyp; Dr Olevia Perches  . Impaired fasting glucose 2007    108; A1c5.4%  . History of lung cancer APRIL 2012  SQUAMOUS CELL---- S/P RIGHT LOWER LOBECTOMY AT [facility] --  NO CHEMORADIATION---  NO RECURRENCE     ONCOLOGIST- DR Tressie Stalker  LOV IN [facility] 10-27-2012  . GERD (gastroesophageal reflux disease)   . BPH (benign prostatic hypertrophy)   . Frequency of  urination   . Urgency of urination   . Nocturia   . Lesion of bladder   . Microhematuria   . Arthritis   . History of basal cell carcinoma excision   . Exertional dyspnea   . Peripheral vascular disease ([place]) S/P ANGIOPLASTY AND STENTING    FOLLOWED  BY DR [name]  . PAD (peripheral artery disease) ([place]) ABI'S  JAN 2014  0.65 ON RIGHT ;  1.04 ON LEFT  . AAA (abdominal aortic aneurysm) ([facility]) LAST ABD. [name]  JAN 2014  3.4CM    MONITORED BY DR [name]  . Bilateral carotid artery stenosis DUPLEX 12-29-2012  BY DR [facility]    BILATERAL ICA STENOSIS 60-79%  . COPD (chronic obstructive pulmonary disease) ([place])   . Stroke [facility]) Jul 07, 2013    mini  TIA  . Cancer ([place]) 01-25-12    LUNG  . Atrial fibrillation ([place])   . Spinal stenosis Sept. 2015   Past Surgical History  Procedure Laterality Date  . Angio plasty      X 4 in legs  . Colon polyectomy    . Trabecular surgery      OS  . Lung lobectomy  01/24/2011     RIGHT UPPER LOBE  (SQUAMOUS CELL CARCINOMA) Dr Dorthea Cove , [facility]. No chemotherapyor radiation  . Laryngoscopy  06-27-2004    BX VOCAL CORD  (  LEUKOPLAKIA)  PER PT NO ISSUES SINCE  . Cataract extraction w/ intraocular lens  implant, bilateral    . Basal cell carcinoma excision      MULTIPLE TIMES--  RIGHT FOREARM, CHEEKS, AND BACK  . Femoral-popliteal bypass graft Left 1994  [facility]    AND 2001  IN [place]  . Cardiovascular stress test  12-08-2012  DR [facility]    NORMAL LEXISCAN WITH NO EXERCISE NUCLEAR STUDY/ EF 66%/   NO ISCHEMIA/ NO SIGNIFICANT CHANGE FROM PRIOR STUDY  . Transthoracic echocardiogram  12-29-2012  DR [facility]    MILD LVH/  LVSF NORMAL/ EF 56-38%/  GRADE I DIASTOLIC DYSFUNCTION  . Aortogram  07-27-2002    MILD DIFFUSE ILIAC ARTERY OCCLUSIVE DISEASE /  LEFT RENAL ARTERY 20%/ PATENT LEFT FEM-POP GRAFT/ MILD SFA AND POPLITEAL ARTERY OCCLUSIVE DISEASE W/ SEVERE KIDNEY OCCLUSIVE DISEASE  . Cystoscopy w/ retrogrades Bilateral 01/21/2013    Procedure: CYSTOSCOPY WITH  RETROGRADE PYELOGRAM;  Surgeon: Molli Hazard, MD;  Location: [facility];  Service: Urology;  Laterality: Bilateral;   CYSTO, BLADDER BIOPSY, BILATERAL RETROGRADE PYELOGRAM  RAD TECH FROM RADIOLOGY PER JOY  . Cystoscopy with biopsy N/A 01/21/2013    Procedure: CYSTOSCOPY WITH BIOPSY;  Surgeon: Molli Hazard, MD;  Location[facility];  Service: Urology;  Laterality: N/A;  . Endarterectomy Right 07/14/2013    Procedure: ENDARTERECTOMY CAROTID;  Surgeon: Angelia Mould, MD;  Location: [facility];  Service: Vascular;  Laterality: Right;  . Patch angioplasty Right 07/14/2013    Procedure: PATCH ANGIOPLASTY;  Surgeon: Angelia Mould, MD;  Location: [facility] OR;  Service: Vascular;  Laterality: Right;  . Carotid endarterectomy Right 07-14-13    cea  . Carotid angiogram N/A 07/10/2013    Procedure: CAROTID ANGIOGRAM;  Surgeon: Elam Dutch, MD;  Location: [facility] CATH LAB;  Service: Cardiovascular;  Laterality: N/A;     Current Outpatient Prescriptions  Medication Sig Dispense Refill  . apixaban (ELIQUIS) 5 MG TABS tablet Take 5 mg by mouth 2 (two) times daily.    . bimatoprost (LUMIGAN) 0.03 % ophthalmic solution Place 1 drop into the left eye at bedtime.     [name] BIOTIN PO Take 5,000 mcg by mouth daily.    . Bisacodyl (DULCOLAX PO) Take 1 capsule by mouth daily as needed (constipation).     . brimonidine (ALPHAGAN P) 0.1 % SOLN Place 3 drops into the left eye 3 (three) times daily.     . cilostazol (PLETAL) 100 MG tablet Take 100 mg by mouth 2 (two) times daily.    [name] diltiazem (CARDIZEM CD) 120 MG 24 hr capsule Take 120 mg by mouth daily.    [name] diltiazem (CARDIZEM) 30 MG tablet Take 1 tablet by mouth every 4 hours AS NEEDED for HR >100 BP >100    . dorzolamide-timolol (COSOPT) 22.3-6.8 MG/ML ophthalmic solution Place 1 drop into the left eye 3 (three) times daily.     [name] doxazosin (CARDURA) 4 MG tablet Take 2 mg by mouth daily.    [name] dronedarone  (MULTAQ) 400 MG tablet Take 400 mg by mouth 2 (two) times daily with a meal.    . FLUoxetine (PROZAC) 20 MG capsule Take 20 mg by mouth daily.    . Ginkgo Biloba 40 MG TABS Take 40 mg by mouth daily. daily    . IRON PO Take 1 tablet by mouth daily.    [name] MELATONIN PO Take 3 mg by mouth at bedtime as needed (  sleep).     . Multiple Vitamins-Minerals (CENTRUM SILVER PO) Take 1 tablet by mouth daily.    . ranitidine (ZANTAC) 150 MG tablet Take 150 mg by mouth daily as needed for heartburn.     . rosuvastatin (CRESTOR) 40 MG tablet Take 40 mg by mouth daily.     No current facility-administered medications for this visit.    Allergies:   Penicillins; Sulfonamide derivatives; Niacin-lovastatin er; Sulfa antibiotics; and Atorvastatin   Social History:  The patient  reports that he quit smoking about 1 years ago. His smoking use included Cigarettes. He has a 25 pack-year smoking history. He has never used smokeless tobacco. He reports that he drinks alcohol. He reports that he does not use illicit drugs.   Family History:  The patient's family history includes Alcohol abuse in his father; Heart attack in his father; Heart disease in his father, paternal aunt, and paternal uncle; Hypertension in his father and paternal aunt; Stroke in his father, mother, paternal aunt, and paternal uncle.    ROS:  Please see the history of present illness.   All other systems are reviewed and negative.    PHYSICAL EXAM: VS:  BP 122/78 mmHg  Pulse 61  Ht '6\' 1"'$  (1.854 m)  Wt 202 lb 6.4 oz (91.808 kg)  BMI 26.71 kg/m2 , BMI Body mass index is 26.71 kg/(m^2). GEN: Well nourished, well developed, in no acute distress HEENT: normal Neck: no JVD, carotid bruits, or masses Cardiac: RRR; no murmurs, rubs, or gallops,no edema  Respiratory:  clear to auscultation bilaterally, normal work of breathing GI: soft, nontender, nondistended, + BS MS: no deformity or atrophy Skin: warm and dry  Neuro:  Strength and sensation  are intact Psych: euthymic mood, full affect  EKG:  EKG is ordered today. The ekg ordered today shows SR  02/15/15: Echocardiogram Study Conclusions - Left ventricle: The cavity size was normal. Wall thickness was normal. Systolic function was normal. The estimated ejection fraction was in the range of 60% to 65%. Wall motion was normal; there were no regional wall motion abnormalities. Impressions: - The image quality is poor.    Recent Labs: 02/08/2015: ALT 18; TSH 1.24 06/23/2015: BUN 16; Creatinine, Ser 1.24; Hemoglobin 12.2*; Platelets 235; Potassium 4.1; Sodium 139    Lipid Panel     Component Value Date/Time   CHOL 149 02/08/2015 0842   TRIG 106.0 02/08/2015 0842   TRIG 97 10/11/2006 1129   HDL 42.00 02/08/2015 0842   CHOLHDL 4 02/08/2015 0842   CHOLHDL 4.0 CALC 10/11/2006 1129   VLDL 21.2 02/08/2015 0842   LDLCALC 86 02/08/2015 0842     Wt Readings from Last 3 Encounters:  07/27/15 202 lb 6.4 oz (91.808 kg)  06/28/15 200 lb 12.8 oz (91.082 kg)  06/22/15 196 lb 13.9 oz (89.3 kg)      Other studies Reviewed: Additional studies/ records that were reviewed today include: ER record, CT scan, notes, last Corotid [name]   ASSESSMENT AND PLAN:  1.  PAFib      CHADS2-Vasc is at least 4 (he denies HTN) on Eliquis and Multaq      The patient is counseled on the importance of medication compliance, life style adjustments, such as ETOH.  He is pending a sleep study.       I am not certain as to how much afib he is having.  A better understanding of his AF burden would be helpful to guide further management. Risks, benefits, and alternatives to  implantable loop recorder placement were discussed with the patient who wishes to proceed. We will then have him return in a month to decide on medical options depending on AF burden and symptoms.  2. Symptoms of dizziness/ unsteadiness in September concerning for TIA.      Will order MRI/MRA of brain with intra/extra cranial  vessels to further evaluate     Compliance with anticoagulation also discussed today  3. PVD Hx of R CEA     Follows with vascular, pending testing and visit in a couple weeks  Follow-up:  68mo sooner if neeeded  Current medicines are reviewed at length with the patient today.   The patient does not have concerns regarding his medicines.  The following changes were made today:  none  Labs/ tests ordered today include:  Orders Placed This Encounter  Procedures  . MR MRA HEAD W WO CONTRAST  . EKG 12-Lead   Today, I have spent 40 minutes with the patient discussing atrial fibrillation .  More than 50% of the visit time today was spent on this issue.    SArmy FossaMD  07/27/2015 9:16 AM     [facility] [address] [phone](office) ([phone](fax)

## 2015-08-12 NOTE — Progress Notes (Signed)
Patient and wife left without receiving discharge instructions.  Called and spoke with wife over the phone to give the Supplemental Discharge Instructions following Loop Recorder Insertion.  Informed with to call company and/or Dr. Jackalyn Lombard office if there were any questions about the device.

## 2015-08-12 NOTE — Interval H&P Note (Signed)
History and Physical Interval Note:  08/12/2015 7:11 AM  Troy Kenner [name]  has presented today for surgery, with the diagnosis of afib  The various methods of treatment have been discussed with the patient and family. After consideration of risks, benefits and other options for treatment, the patient has consented to  Procedure(s): Loop Recorder Insertion (N/A) as a surgical intervention .  The patient's history has been reviewed, patient examined, no change in status, stable for surgery.  I have reviewed the patient's chart and labs.  Questions were answered to the patient's satisfaction.     Thompson Grayer

## 2015-08-15 ENCOUNTER — Encounter (HOSPITAL_COMMUNITY): Payer: Self-pay | Admitting: Internal Medicine

## 2015-08-15 ENCOUNTER — Encounter: Payer: Self-pay | Admitting: Family

## 2015-08-17 ENCOUNTER — Ambulatory Visit (INDEPENDENT_AMBULATORY_CARE_PROVIDER_SITE_OTHER): Payer: BLUE CROSS/BLUE SHIELD | Admitting: Family

## 2015-08-17 ENCOUNTER — Encounter: Payer: Self-pay | Admitting: Family

## 2015-08-17 VITALS — BP 139/71 | HR 71 | Temp 97.2°F | Resp 16 | Ht 73.0 in | Wt 201.0 lb

## 2015-08-17 DIAGNOSIS — I739 Peripheral vascular disease, unspecified: Secondary | ICD-10-CM

## 2015-08-17 DIAGNOSIS — I714 Abdominal aortic aneurysm, without rupture, unspecified: Secondary | ICD-10-CM

## 2015-08-17 DIAGNOSIS — Z48812 Encounter for surgical aftercare following surgery on the circulatory system: Secondary | ICD-10-CM | POA: Diagnosis not present

## 2015-08-17 DIAGNOSIS — I6523 Occlusion and stenosis of bilateral carotid arteries: Secondary | ICD-10-CM

## 2015-08-17 DIAGNOSIS — Z9889 Other specified postprocedural states: Secondary | ICD-10-CM

## 2015-08-17 DIAGNOSIS — Z95828 Presence of other vascular implants and grafts: Secondary | ICD-10-CM

## 2015-08-17 NOTE — Patient Instructions (Addendum)
Stroke Prevention Some medical conditions and behaviors are associated with an increased chance of having a stroke. You may prevent a stroke by making healthy choices and managing medical conditions. HOW CAN I REDUCE MY RISK OF HAVING A STROKE?   Stay physically active. Get at least 30 minutes of activity on most or all days.  Do not smoke. It may also be helpful to avoid exposure to secondhand smoke.  Limit alcohol use. Moderate alcohol use is considered to be:  No more than 2 drinks per day for men.  No more than 1 drink per day for nonpregnant women.  Eat healthy foods. This involves:  Eating 5 or more servings of fruits and vegetables a day.  Making dietary changes that address high blood pressure (hypertension), high cholesterol, diabetes, or obesity.  Manage your cholesterol levels.  Making food choices that are high in fiber and low in saturated fat, trans fat, and cholesterol may control cholesterol levels.  Take any prescribed medicines to control cholesterol as directed by your health care provider.  Manage your diabetes.  Controlling your carbohydrate and sugar intake is recommended to manage diabetes.  Take any prescribed medicines to control diabetes as directed by your health care provider.  Control your hypertension.  Making food choices that are low in salt (sodium), saturated fat, trans fat, and cholesterol is recommended to manage hypertension.  Ask your health care provider if you need treatment to lower your blood pressure. Take any prescribed medicines to control hypertension as directed by your health care provider.  If you are 18-39 years of age, have your blood pressure checked every 3-5 years. If you are 40 years of age or older, have your blood pressure checked every year.  Maintain a healthy weight.  Reducing calorie intake and making food choices that are low in sodium, saturated fat, trans fat, and cholesterol are recommended to manage  weight.  Stop drug abuse.  Avoid taking birth control pills.  Talk to your health care provider about the risks of taking birth control pills if you are over 35 years old, smoke, get migraines, or have ever had a blood clot.  Get evaluated for sleep disorders (sleep apnea).  Talk to your health care provider about getting a sleep evaluation if you snore a lot or have excessive sleepiness.  Take medicines only as directed by your health care provider.  For some people, aspirin or blood thinners (anticoagulants) are helpful in reducing the risk of forming abnormal blood clots that can lead to stroke. If you have the irregular heart rhythm of atrial fibrillation, you should be on a blood thinner unless there is a good reason you cannot take them.  Understand all your medicine instructions.  Make sure that other conditions (such as anemia or atherosclerosis) are addressed. SEEK IMMEDIATE MEDICAL CARE IF:   You have sudden weakness or numbness of the face, arm, or leg, especially on one side of the body.  Your face or eyelid droops to one side.  You have sudden confusion.  You have trouble speaking (aphasia) or understanding.  You have sudden trouble seeing in one or both eyes.  You have sudden trouble walking.  You have dizziness.  You have a loss of balance or coordination.  You have a sudden, severe headache with no known cause.  You have new chest pain or an irregular heartbeat. Any of these symptoms may represent a serious problem that is an emergency. Do not wait to see if the symptoms will   go away. Get medical help at once. Call your local emergency services (911 in [place] Do not drive yourself to the hospital.   This information is not intended to replace advice given to you by your health care provider. Make sure you discuss any questions you have with your health care provider.   Document Released: 10/25/2004 Document Revised: 10/08/2014 Document Reviewed:  03/20/2013 Elsevier Interactive Patient Education 2016 Elsevier Inc.    Peripheral Vascular Disease Peripheral vascular disease (PVD) is a disease of the blood vessels that are not part of your heart and brain. A simple term for PVD is poor circulation. In most cases, PVD narrows the blood vessels that carry blood from your heart to the rest of your body. This can result in a decreased supply of blood to your arms, legs, and internal organs, like your stomach or kidneys. However, it most often affects a person's lower legs and feet. There are two types of PVD.  Organic PVD. This is the more common type. It is caused by damage to the structure of blood vessels.  Functional PVD. This is caused by conditions that make blood vessels contract and tighten (spasm). Without treatment, PVD tends to get worse over time. PVD can also lead to acute ischemic limb. This is when an arm or limb suddenly has trouble getting enough blood. This is a medical emergency. CAUSES Each type of PVD has many different causes. The most common cause of PVD is buildup of a fatty material (plaque) inside of your arteries (atherosclerosis). Small amounts of plaque can break off from the walls of the blood vessels and become lodged in a smaller artery. This blocks blood flow and can cause acute ischemic limb. Other common causes of PVD include:  Blood clots that form inside of blood vessels.  Injuries to blood vessels.  Diseases that cause inflammation of blood vessels or cause blood vessel spasms.  Health behaviors and health history that increase your risk of developing PVD. RISK FACTORS  You may have a greater risk of PVD if you:  Have a family history of PVD.  Have certain medical conditions, including:  High cholesterol.  Diabetes.  High blood pressure (hypertension).  Coronary heart disease.  Past problems with blood clots.  Past injury, such as burns or a broken bone. These may have damaged blood  vessels in your limbs.  Buerger disease. This is caused by inflamed blood vessels in your hands and feet.  Some forms of arthritis.  Rare birth defects that affect the arteries in your legs.  Use tobacco.  Do not get enough exercise.  Are obese.  Are age 50 or older. SIGNS AND SYMPTOMS  PVD may cause many different symptoms. Your symptoms depend on what part of your body is not getting enough blood. Some common signs and symptoms include:  Cramps in your lower legs. This may be a symptom of poor leg circulation (claudication).  Pain and weakness in your legs while you are physically active that goes away when you rest (intermittent claudication).  Leg pain when at rest.  Leg numbness, tingling, or weakness.  Coldness in a leg or foot, especially when compared with the other leg.  Skin or hair changes. These can include:  Hair loss.  Shiny skin.  Pale or bluish skin.  Thick toenails.  Inability to get or maintain an erection (erectile dysfunction). People with PVD are more prone to developing ulcers and sores on their toes, feet, or legs. These may take longer than   normal to heal. DIAGNOSIS Your health care provider may diagnose PVD from your signs and symptoms. The health care provider will also do a physical exam. You may have tests to find out what is causing your PVD and determine its severity. Tests may include:  Blood pressure recordings from your arms and legs and measurements of the strength of your pulses (pulse volume recordings).  Imaging studies using sound waves to take pictures of the blood flow through your blood vessels (Doppler ultrasound).  Injecting a dye into your blood vessels before having imaging studies using:  X-rays (angiogram or arteriogram).  Computer-generated X-rays (CT angiogram).  A powerful electromagnetic field and a computer (magnetic resonance angiogram or MRA). TREATMENT Treatment for PVD depends on the cause of your condition  and the severity of your symptoms. It also depends on your age. Underlying causes need to be treated and controlled. These include long-lasting (chronic) conditions, such as diabetes, high cholesterol, and high blood pressure. You may need to first try making lifestyle changes and taking medicines. Surgery may be needed if these do not work. Lifestyle changes may include:  Quitting smoking.  Exercising regularly.  Following a low-fat, low-cholesterol diet. Medicines may include:  Blood thinners to prevent blood clots.  Medicines to improve blood flow.  Medicines to improve your blood cholesterol levels. Surgical procedures may include:  A procedure that uses an inflated balloon to open a blocked artery and improve blood flow (angioplasty).  A procedure to put in a tube (stent) to keep a blocked artery open (stent implant).  Surgery to reroute blood flow around a blocked artery (peripheral bypass surgery).  Surgery to remove dead tissue from an infected wound on the affected limb.  Amputation. This is surgical removal of the affected limb. This may be necessary in cases of acute ischemic limb that are not improved through medical or surgical treatments. HOME CARE INSTRUCTIONS  Take medicines only as directed by your health care provider.  Do not use any tobacco products, including cigarettes, chewing tobacco, or electronic cigarettes. If you need help quitting, ask your health care provider.  Lose weight if you are overweight, and maintain a healthy weight as directed by your health care provider.  Eat a diet that is low in fat and cholesterol. If you need help, ask your health care provider.  Exercise regularly. Ask your health care provider to suggest some good activities for you.  Use compression stockings or other mechanical devices as directed by your health care provider.  Take good care of your feet.  Wear comfortable shoes that fit well.  Check your feet often for  any cuts or sores. SEEK MEDICAL CARE IF:  You have cramps in your legs while walking.  You have leg pain when you are at rest.  You have coldness in a leg or foot.  Your skin changes.  You have erectile dysfunction.  You have cuts or sores on your feet that are not healing. SEEK IMMEDIATE MEDICAL CARE IF:  Your arm or leg turns cold and blue.  Your arms or legs become red, warm, swollen, painful, or numb.  You have chest pain or trouble breathing.  You suddenly have weakness in your face, arm, or leg.  You become very confused or lose the ability to speak.  You suddenly have a very bad headache or lose your vision.   This information is not intended to replace advice given to you by your health care provider. Make sure you discuss any questions   you have with your health care provider.   Document Released: 10/25/2004 Document Revised: 10/08/2014 Document Reviewed: 02/25/2014 Elsevier Interactive Patient Education 2016 Elsevier Inc.  

## 2015-08-17 NOTE — Progress Notes (Signed)
[facility] [place] HISTORY AND PHYSICAL   MRN : [id number]  History of Present Illness:   Troy Honse [name] is a 74 y.o. male who is s/p left femoropopliteal arterial bypass graft placed in 1994 with revision in 2001, and left CEA on 07/14/13 by Dr. [name].  He also has a small abdominal aortic aneurysm (3.4 cm in January 2014) that we have been monitoring.   He returns today for follow up.   Medical problems include hypertension, hyperlipidemia, glaucoma, and remote history of lung cancer.   After about 1/2 mile of walking he develops bilateral buttocks and calf pain, worse in the right, denies non healing wounds, no worse than his last visit.  Dr. Susa Day, ortho, determined that he has spinal stenosis, which he states has affected his walking and he has therefore decreased his walking significantly.  The patient had a preoperative TIA as manifested by left arm tremor and weakness, both legs giving way, and seeing purple spots in both eyes, he has no residual neurological deficits.   He had an exacerbation of atrial fib and a TIA in October 2016 as manifested by nausea, dizziness; he denies lateralizing symptoms, denies monocular loss of vision, denies speech difficulties, was evaluated at [facility]. Wife states he was discharged in atrial flutter, had a cardiac recorder implanted last week, sees [name].  Pt Diabetic: No  Pt smoker: former smoker, quit October, 2014  Pt meds include: Statin :Yes Betablocker: no ASA: No Other anticoagulants/antiplatelets: Eliquis for atrial fib prescribed by [name], Plavix stopped He also takes Pletal     Current Outpatient Prescriptions  Medication Sig Dispense Refill  . bimatoprost (LUMIGAN) 0.03 % ophthalmic solution Place 1 drop into the left eye at bedtime.     [name] BIOTIN PO Take 5,000 mcg by mouth daily.    . Bisacodyl (DULCOLAX PO) Take 1 capsule by mouth daily as needed (constipation).     . brimonidine  (ALPHAGAN P) 0.1 % SOLN Place 3 drops into the left eye 3 (three) times daily.     . cilostazol (PLETAL) 100 MG tablet Take 100 mg by mouth 2 (two) times daily.    [name] diltiazem (CARDIZEM CD) 120 MG 24 hr capsule Take 120 mg by mouth daily.    [name] diltiazem (CARDIZEM) 30 MG tablet Take 1 tablet by mouth every 4 hours AS NEEDED for HR >100 BP >100    . dorzolamide-timolol (COSOPT) 22.3-6.8 MG/ML ophthalmic solution Place 1 drop into the left eye 3 (three) times daily.     [name] doxazosin (CARDURA) 4 MG tablet Take 2 mg by mouth daily.    [name] dronedarone (MULTAQ) 400 MG tablet Take 400 mg by mouth 2 (two) times daily with a meal.    . ELIQUIS 5 MG TABS tablet TAKE 1 TABLET TWICE DAILY. (Patient taking differently: TAKE 5 MG BY MOUTH TWICE DAILY.) 180 tablet 3  . FLUoxetine (PROZAC) 20 MG capsule Take 20 mg by mouth daily.    . Ginkgo Biloba 40 MG TABS Take 40 mg by mouth daily.     . IRON PO Take 1 tablet by mouth daily.    [name] MELATONIN PO Take 3 mg by mouth at bedtime as needed (sleep).     . Multiple Vitamins-Minerals (CENTRUM SILVER PO) Take 1 tablet by mouth daily.    . ranitidine (ZANTAC) 150 MG tablet Take 150 mg by mouth daily as needed for heartburn.     . rosuvastatin (CRESTOR) 40 MG  tablet TAKE 1 TABLET ONCE DAILY. (Patient taking differently: TAKE 40 MG BY MOUTH ONCE DAILY.) 90 tablet 3   No current facility-administered medications for this visit.    Past Medical History  Diagnosis Date  . Hypertension   . Hyperlipidemia   . Glaucoma BOTH EYES    Dr Gershon Crane  . Hx of adenomatous colonic polyps 2005     X 2; 1 hyperplastic polyp; Dr Olevia Perches  . Impaired fasting glucose 2007    108; A1c5.4%  . History of lung cancer APRIL 2012  SQUAMOUS CELL---- S/P RIGHT LOWER LOBECTOMY AT [facility] --  NO CHEMORADIATION---  NO RECURRENCE     ONCOLOGIST- DR Tressie Stalker  LOV IN [facility] 10-27-2012  . GERD (gastroesophageal reflux disease)   . BPH (benign prostatic hypertrophy)   . Frequency of urination   . Urgency  of urination   . Nocturia   . Lesion of bladder   . Microhematuria   . Arthritis   . History of basal cell carcinoma excision   . Exertional dyspnea   . Peripheral vascular disease ([place]) S/P ANGIOPLASTY AND STENTING    FOLLOWED  BY DR [name]  . PAD (peripheral artery disease) ([place]) ABI'S  JAN 2014  0.65 ON RIGHT ;  1.04 ON LEFT  . AAA (abdominal aortic aneurysm) ([facility]) LAST ABD. [name]  JAN 2014  3.4CM    MONITORED BY DR [name]  . Bilateral carotid artery stenosis DUPLEX 12-29-2012  BY DR [facility]    BILATERAL ICA STENOSIS 60-79%  . COPD (chronic obstructive pulmonary disease) ([place])   . Stroke [facility]) Jul 07, 2013    mini  TIA  . Cancer ([place]) 01-25-12    LUNG  . Atrial fibrillation ([place])   . Spinal stenosis Sept. 2015    Social History Social History  Substance Use Topics  . Smoking status: Former Smoker -- 0.50 packs/day for 50 years    Types: Cigarettes    Quit date: 07/28/2013  . Smokeless tobacco: Never Used     Comment: pt states that he is now taking chantix  . Alcohol Use: Yes     Comment:  socially, variable    Family History Family History  Problem Relation Age of Onset  . Stroke Mother     mini strokes  . Alcohol abuse Father   . Heart disease Father     MI after 79  . Stroke Father   . Hypertension Father   . Heart attack Father   . Heart disease Paternal Aunt     several  . Hypertension Paternal Aunt     several  . Stroke Paternal Aunt     several  . Stroke Paternal Uncle     several  . Heart disease Paternal Uncle     several;2 had MI pre 50    Surgical History Past Surgical History  Procedure Laterality Date  . Angio plasty      X 4 in legs  . Colon polyectomy    . Trabecular surgery      OS  . Lung lobectomy  01/24/2011     RIGHT UPPER LOBE  (SQUAMOUS CELL CARCINOMA) Dr Dorthea Cove , [facility]. No chemotherapyor radiation  . Laryngoscopy  06-27-2004    BX VOCAL CORD  (LEUKOPLAKIA)  PER PT NO ISSUES SINCE  . Cataract extraction w/ intraocular lens   implant, bilateral    . Basal cell carcinoma excision      MULTIPLE TIMES--  RIGHT FOREARM, CHEEKS, AND BACK  . Femoral-popliteal  bypass graft Left 1994  [facility]    AND 2001  IN [place]  . Cardiovascular stress test  12-08-2012  DR [facility]    NORMAL LEXISCAN WITH NO EXERCISE NUCLEAR STUDY/ EF 66%/   NO ISCHEMIA/ NO SIGNIFICANT CHANGE FROM PRIOR STUDY  . Transthoracic echocardiogram  12-29-2012  DR [facility]    MILD LVH/  LVSF NORMAL/ EF 41-66%/  GRADE I DIASTOLIC DYSFUNCTION  . Aortogram  07-27-2002    MILD DIFFUSE ILIAC ARTERY OCCLUSIVE DISEASE /  LEFT RENAL ARTERY 20%/ PATENT LEFT FEM-POP GRAFT/ MILD SFA AND POPLITEAL ARTERY OCCLUSIVE DISEASE W/ SEVERE KIDNEY OCCLUSIVE DISEASE  . Cystoscopy w/ retrogrades Bilateral 01/21/2013    Procedure: CYSTOSCOPY WITH RETROGRADE PYELOGRAM;  Surgeon: Molli Hazard, MD;  Location: [facility];  Service: Urology;  Laterality: Bilateral;   CYSTO, BLADDER BIOPSY, BILATERAL RETROGRADE PYELOGRAM  RAD TECH FROM RADIOLOGY PER JOY  . Cystoscopy with biopsy N/A 01/21/2013    Procedure: CYSTOSCOPY WITH BIOPSY;  Surgeon: Molli Hazard, MD;  Location: [facility];  Service: Urology;  Laterality: N/A;  . Endarterectomy Right 07/14/2013    Procedure: ENDARTERECTOMY CAROTID;  Surgeon: Angelia Mould, MD;  Location: [name];  Service: Vascular;  Laterality: Right;  . Patch angioplasty Right 07/14/2013    Procedure: PATCH ANGIOPLASTY;  Surgeon: Angelia Mould, MD;  Location: [facility] OR;  Service: Vascular;  Laterality: Right;  . Carotid endarterectomy Right 07-14-13    cea  . Carotid angiogram N/A 07/10/2013    Procedure: CAROTID ANGIOGRAM;  Surgeon: Elam Dutch, MD;  Location: [facility] CATH LAB;  Service: Cardiovascular;  Laterality: N/A;  . Ep implantable device N/A 08/12/2015    Procedure: Loop Recorder Insertion;  Surgeon: Thompson Grayer, MD;  Location: [name] CV LAB;  Service: Cardiovascular;  Laterality: N/A;     Allergies  Allergen Reactions  . Penicillins Hives, Shortness Of Breath and Other (See Comments)    Flushing  & dyspnea Because of a history of documented adverse serious drug reaction;Medi Alert bracelet  is recommended PCN reaction causing immediate rash, facial/tongue/throat swelling, SOB or lightheadedness with hypotension: Yes PCN reaction causing severe rash involving mucus membranes or skin necrosis: No PCN reaction occurring within the last 10 years: NO PCN reaction that required hospitalization: NO  . Sulfonamide Derivatives Shortness Of Breath and Other (See Comments)    Flushing & dyspnea Because of a history of documented adverse serious drug reaction;Medi Alert bracelet  is recommended  . Niacin-Lovastatin Er Other (See Comments)    Dyspnea, flushing  . Atorvastatin Other (See Comments)    Myalgias & athralgias- takes Crestor  . Sulfa Antibiotics Hives    Current Outpatient Prescriptions  Medication Sig Dispense Refill  . bimatoprost (LUMIGAN) 0.03 % ophthalmic solution Place 1 drop into the left eye at bedtime.     [name] BIOTIN PO Take 5,000 mcg by mouth daily.    . Bisacodyl (DULCOLAX PO) Take 1 capsule by mouth daily as needed (constipation).     . brimonidine (ALPHAGAN P) 0.1 % SOLN Place 3 drops into the left eye 3 (three) times daily.     . cilostazol (PLETAL) 100 MG tablet Take 100 mg by mouth 2 (two) times daily.    [name] diltiazem (CARDIZEM CD) 120 MG 24 hr capsule Take 120 mg by mouth daily.    [name] diltiazem (CARDIZEM) 30 MG tablet Take 1 tablet by mouth every 4 hours AS NEEDED for HR >100 BP >100    .  dorzolamide-timolol (COSOPT) 22.3-6.8 MG/ML ophthalmic solution Place 1 drop into the left eye 3 (three) times daily.     [name] doxazosin (CARDURA) 4 MG tablet Take 2 mg by mouth daily.    [name] dronedarone (MULTAQ) 400 MG tablet Take 400 mg by mouth 2 (two) times daily with a meal.    . ELIQUIS 5 MG TABS tablet TAKE 1 TABLET TWICE DAILY. (Patient taking differently: TAKE 5 MG  BY MOUTH TWICE DAILY.) 180 tablet 3  . FLUoxetine (PROZAC) 20 MG capsule Take 20 mg by mouth daily.    . Ginkgo Biloba 40 MG TABS Take 40 mg by mouth daily.     . IRON PO Take 1 tablet by mouth daily.    [name] MELATONIN PO Take 3 mg by mouth at bedtime as needed (sleep).     . Multiple Vitamins-Minerals (CENTRUM SILVER PO) Take 1 tablet by mouth daily.    . ranitidine (ZANTAC) 150 MG tablet Take 150 mg by mouth daily as needed for heartburn.     . rosuvastatin (CRESTOR) 40 MG tablet TAKE 1 TABLET ONCE DAILY. (Patient taking differently: TAKE 40 MG BY MOUTH ONCE DAILY.) 90 tablet 3   No current facility-administered medications for this visit.     REVIEW OF SYSTEMS: See HPI for pertinent positives and negatives.  Physical Examination Filed Vitals:   08/17/15 1201  BP: 139/71  Pulse: 71  Temp: 97.2 F (36.2 C)  TempSrc: Oral  Resp: 16  Height: '6\' 1"'$  (1.854 m)  Weight: 201 lb (91.173 kg)  SpO2: 97%   Body mass index is 26.52 kg/(m^2).  General: WDWN in NAD  Gait: Normal  HENT: WNL  Eyes: Pupils equal  Pulmonary: normal non-labored breathing , without Rales, rhonchi, wheezing  Cardiac: RRR, no murmur detected  Abdomen: soft, NT, no masses palpated. Skin: no rashes, no ulcers, no cellulitis.  VASCULAR EXAM  Carotid Bruits  Left  Right    Negative  Negative    VASCULAR EXAM:  Extremities without ischemic changes  without Gangrene; without open wounds.   LE Pulses  LEFT  RIGHT   FEMORAL  palpable  palpable   POPLITEAL  not palpable  not palpable   POSTERIOR TIBIAL  not palpable  not palpable   DORSALIS PEDIS  ANTERIOR TIBIAL  not palpable  not palpable    Musculoskeletal: no muscle wasting or atrophy; no peripheral edema.  Neurologic: A&O X 3; Appropriate Affect ;  SENSATION: normal;  MOTOR FUNCTION: 5/5 Symmetric, CN 2-12 intact except for uvula deviation slightly to the left  Speech is fluent/normal              Non-Invasive Vascular Imaging (08/10/15):  CEREBROVASCULAR DUPLEX EVALUATION    INDICATION: Follow-up carotid disease     PREVIOUS INTERVENTION(S): Right carotid endarterectomy 07/14/2013    DUPLEX EXAM:     RIGHT  LEFT  Peak Systolic Velocities (cm/s) End Diastolic Velocities (cm/s) Plaque LOCATION Peak Systolic Velocities (cm/s) End Diastolic Velocities (cm/s) Plaque  138 18  CCA PROXIMAL 169 26   84 17  CCA MID 140 24   93 17  CCA DISTAL 110 20 HT  329 22 HT ECA 223 22 HT  80 21  ICA PROXIMAL 134 33 HT  108 31  ICA MID 93 27   105 30  ICA DISTAL 82 25     NA ICA / CCA Ratio (PSV) 0.96  Antegrade  Vertebral Flow Antegrade    Brachial Systolic Pressure (mmHg)   Within  normal limits  Subclavian Artery Waveforms Within normal limits     Plaque Morphology:  HM = Homogeneous, HT = Heterogeneous, CP = Calcific Plaque, SP = Smooth Plaque, IP = Irregular Plaque     ADDITIONAL FINDINGS:     IMPRESSION: 1. Widely patent right carotid endarterectomy without evidence of restenosis or hyperplasia.  2. Evidence of <40% left internal carotid artery stenosis. 3. >50% stenosis of the right external carotid artery. 4. Bilateral vertebral artery is antegrade.    Compared to the previous exam:  No significant change compared to prior exam.      LOWER EXTREMITY ARTERIAL DUPLEX EVALUATION    INDICATION: Follow-up left lower extremity bypass graft     PREVIOUS INTERVENTION(S): Left femoropopliteal arterial bypass graft placed in 1994 with revision in 2001    DUPLEX EXAM:     RIGHT  LEFT   Peak Systolic Velocity (cm/s) Ratio (if abnormal) Waveform  Peak Systolic Velocity (cm/s) Ratio (if abnormal) Waveform     Inflow Artery 107  B     Proximal Anastomosis 686 6.4 Stenotic     Proximal Graft 359  Turbulent     Mid Graft 44  M      Distal Graft 38  M     Distal Anastomosis 34  M     Outflow Artery 51  M  0.58 Today's ABI / TBI 0.79  0.60 Previous ABI / TBI (12/28/2014 ) 0.88     Waveform:    M - Monophasic       B - Biphasic       T - Triphasic  If Ankle Brachial Index (ABI) or Toe Brachial Index (TBI) performed, please see complete report  ADDITIONAL FINDINGS:     IMPRESSION: Patent left lower extremity bypass graft with significant (>70%) stenosis of the proximal anastomosis.    Compared to the previous exam:  No previous exam at this facility for comparison.       ASSESSMENT:  Troy Shirk [name] is a 74 y.o. male who is s/p left femoropopliteal arterial bypass graft placed in 1994 with revision in 2001, and left CEA on 07/14/13 by Dr. [name].  He also has a small abdominal aortic aneurysm that we have been monitoring (3.4 cm in January 2014). He has non life limiting bilateral hip and calf pain, relieved by rest. He has no ischemia in his feet/legs. He had a pre CEA TIA, then last month he had another TIA with an exacerbation of his atrial fib for which he was evaluated.  He takes Eliquis. 08/10/15 carotid duplex suggests a widely patent right carotid endarterectomy without evidence of restenosis or hyperplasia.  Evidence of <40% left internal carotid artery stenosis. No significant change compared to prior exam.    08/10/15 left LE arterial duplex suggests a patent left lower extremity bypass graft with significant (>70%) stenosis of the proximal anastomosis (686 cm/s).    AAA duplex is due about March 2017. Dr. [name] spoke with pt and wife.   Face to face time with patient was 25 minutes. Over 50% of this time was spent on counseling and coordination of care.   PLAN:   Based on today's exam and non-invasive vascular lab results, the patient will be scheduled for arteriogram by Dr. [name] on 08/29/15, with bilateral run off, possible intervention, access right femoral artery. I discussed in depth with the patient the nature of atherosclerosis, and emphasized the importance of maximal medical management including strict control of blood pressure, blood  glucose, and lipid levels, obtaining regular exercise, and cessation of smoking.  The patient is aware that without maximal medical management the underlying atherosclerotic disease process will progress, limiting the benefit of any interventions. Consideration for repair of AAA would be made when the size approaches 4.8 or 5.0 cm, growth > 1 cm/yr, and symptomatic status. The patient was given information about stroke prevention and what symptoms should prompt the patient to seek immediate medical care. The patient was given information about AAA including signs, symptoms, treatment,  what symptoms should prompt the patient to seek immediate medical care, and how to minimize the risk of enlargement and rupture of aneurysms. The patient was given information about PAD including signs, symptoms, treatment, what symptoms should prompt the patient to seek immediate medical care, and risk reduction measures to take. Thank you for allowing [name] to participate in this patient's care.  Clemon Chambers, RN, MSN, FNP-C [facility] Office: [phone]  Clinic MD: [name]  08/17/2015 12:12 PM

## 2015-08-18 ENCOUNTER — Other Ambulatory Visit: Payer: Self-pay

## 2015-08-18 ENCOUNTER — Telehealth: Payer: Self-pay | Admitting: Internal Medicine

## 2015-08-18 NOTE — Telephone Encounter (Signed)
Request for surgical clearance:  1. What type of surgery is being performed? Angio gram   2. When is this surgery scheduled? 08/29/2015  3. Are there any medications that need to be held prior to surgery and how long? Eliquis 2 days prior   4. Name of physician performing surgery? Dr. [name]   5. What is your office phone and fax number? Everything is on EPIC Fax: [phone]

## 2015-08-19 NOTE — Telephone Encounter (Signed)
Pt has a CHADS score of 3, including a history of TIA.  Would prefer to hold 1 day if possible given history of TIA.

## 2015-08-22 ENCOUNTER — Telehealth: Payer: Self-pay

## 2015-08-22 NOTE — Telephone Encounter (Signed)
-----   Message from Angelia Mould, MD sent at 08/19/2015  4:14 PM EST ----- That's fine. Thanks CD ----- Message -----    From: Gregery Na, RN    Sent: 08/19/2015   3:57 PM      To: Angelia Mould, MD  Please see message below from [facility] [place], [facility] Mr. Kost Eliquis. He is scheduled for AGM on 08/29/15.  "Pt has a CHADS score of 3, including a history of TIA. Would prefer to hold 1 day if possible given history of TIA."   Is 1 day okay for you? Thanks.  Colletta [place]

## 2015-08-23 ENCOUNTER — Ambulatory Visit (HOSPITAL_BASED_OUTPATIENT_CLINIC_OR_DEPARTMENT_OTHER): Payer: BLUE CROSS/BLUE SHIELD | Attending: Nurse Practitioner

## 2015-08-23 VITALS — Ht 73.0 in | Wt 200.0 lb

## 2015-08-23 DIAGNOSIS — J449 Chronic obstructive pulmonary disease, unspecified: Secondary | ICD-10-CM

## 2015-08-23 DIAGNOSIS — G4733 Obstructive sleep apnea (adult) (pediatric): Secondary | ICD-10-CM | POA: Diagnosis not present

## 2015-08-23 DIAGNOSIS — Z79899 Other long term (current) drug therapy: Secondary | ICD-10-CM | POA: Diagnosis not present

## 2015-08-23 DIAGNOSIS — R0683 Snoring: Secondary | ICD-10-CM

## 2015-08-23 DIAGNOSIS — Z7901 Long term (current) use of anticoagulants: Secondary | ICD-10-CM | POA: Insufficient documentation

## 2015-08-23 DIAGNOSIS — G4736 Sleep related hypoventilation in conditions classified elsewhere: Secondary | ICD-10-CM | POA: Diagnosis not present

## 2015-08-23 DIAGNOSIS — I493 Ventricular premature depolarization: Secondary | ICD-10-CM | POA: Insufficient documentation

## 2015-08-23 DIAGNOSIS — I4891 Unspecified atrial fibrillation: Secondary | ICD-10-CM

## 2015-08-24 ENCOUNTER — Ambulatory Visit (INDEPENDENT_AMBULATORY_CARE_PROVIDER_SITE_OTHER): Payer: BLUE CROSS/BLUE SHIELD | Admitting: *Deleted

## 2015-08-24 ENCOUNTER — Encounter (HOSPITAL_BASED_OUTPATIENT_CLINIC_OR_DEPARTMENT_OTHER): Payer: BLUE CROSS/BLUE SHIELD

## 2015-08-24 DIAGNOSIS — I480 Paroxysmal atrial fibrillation: Secondary | ICD-10-CM

## 2015-08-24 LAB — CUP PACEART INCLINIC DEVICE CHECK: MDC IDC SESS DTM: 20161123142132

## 2015-08-24 NOTE — Progress Notes (Signed)
Wound check in clinic s/p ILR implant. Wound well healed without redness or edema.  Pt with 0 tachy episodes; 0 brady episodes; 0 asystole; 0 AF episodes; 0 symptom episodes. Plan to continue [facility] f/u and f/u w/ JA on 12/19 @ 1000.

## 2015-08-29 ENCOUNTER — Encounter (HOSPITAL_BASED_OUTPATIENT_CLINIC_OR_DEPARTMENT_OTHER): Payer: Self-pay

## 2015-08-29 ENCOUNTER — Other Ambulatory Visit: Payer: Self-pay | Admitting: Internal Medicine

## 2015-08-29 ENCOUNTER — Other Ambulatory Visit: Payer: Self-pay

## 2015-08-29 ENCOUNTER — Encounter (HOSPITAL_COMMUNITY): Payer: Self-pay | Admitting: *Deleted

## 2015-08-29 ENCOUNTER — Encounter (HOSPITAL_COMMUNITY)
Admission: RE | Disposition: A | Discharge status: Home / Self Care | Payer: Self-pay | Source: Ambulatory Visit | Attending: Vascular Surgery

## 2015-08-29 ENCOUNTER — Telehealth: Payer: Self-pay | Admitting: Cardiology

## 2015-08-29 ENCOUNTER — Ambulatory Visit (HOSPITAL_COMMUNITY)
Admission: RE | Admit: 2015-08-29 | Discharge: 2015-08-29 | Disposition: A | Discharge status: Home / Self Care | Payer: BLUE CROSS/BLUE SHIELD | Source: Ambulatory Visit | Attending: Vascular Surgery | Admitting: Vascular Surgery

## 2015-08-29 DIAGNOSIS — R3915 Urgency of urination: Secondary | ICD-10-CM

## 2015-08-29 DIAGNOSIS — I70402 Unspecified atherosclerosis of autologous vein bypass graft(s) of the extremities, left leg: Secondary | ICD-10-CM | POA: Insufficient documentation

## 2015-08-29 DIAGNOSIS — Z85828 Personal history of other malignant neoplasm of skin: Secondary | ICD-10-CM | POA: Insufficient documentation

## 2015-08-29 DIAGNOSIS — H409 Unspecified glaucoma: Secondary | ICD-10-CM | POA: Insufficient documentation

## 2015-08-29 DIAGNOSIS — E785 Hyperlipidemia, unspecified: Secondary | ICD-10-CM

## 2015-08-29 DIAGNOSIS — K219 Gastro-esophageal reflux disease without esophagitis: Secondary | ICD-10-CM | POA: Insufficient documentation

## 2015-08-29 DIAGNOSIS — I4891 Unspecified atrial fibrillation: Secondary | ICD-10-CM | POA: Insufficient documentation

## 2015-08-29 DIAGNOSIS — Z7901 Long term (current) use of anticoagulants: Secondary | ICD-10-CM

## 2015-08-29 DIAGNOSIS — G4733 Obstructive sleep apnea (adult) (pediatric): Secondary | ICD-10-CM

## 2015-08-29 DIAGNOSIS — R351 Nocturia: Secondary | ICD-10-CM | POA: Insufficient documentation

## 2015-08-29 DIAGNOSIS — I714 Abdominal aortic aneurysm, without rupture: Secondary | ICD-10-CM

## 2015-08-29 DIAGNOSIS — R3129 Other microscopic hematuria: Secondary | ICD-10-CM | POA: Insufficient documentation

## 2015-08-29 DIAGNOSIS — Z8673 Personal history of transient ischemic attack (TIA), and cerebral infarction without residual deficits: Secondary | ICD-10-CM | POA: Insufficient documentation

## 2015-08-29 DIAGNOSIS — I6523 Occlusion and stenosis of bilateral carotid arteries: Secondary | ICD-10-CM

## 2015-08-29 DIAGNOSIS — M1990 Unspecified osteoarthritis, unspecified site: Secondary | ICD-10-CM | POA: Insufficient documentation

## 2015-08-29 DIAGNOSIS — N401 Enlarged prostate with lower urinary tract symptoms: Secondary | ICD-10-CM | POA: Insufficient documentation

## 2015-08-29 DIAGNOSIS — T82898A Other specified complication of vascular prosthetic devices, implants and grafts, initial encounter: Secondary | ICD-10-CM | POA: Diagnosis not present

## 2015-08-29 DIAGNOSIS — Z823 Family history of stroke: Secondary | ICD-10-CM | POA: Insufficient documentation

## 2015-08-29 DIAGNOSIS — I10 Essential (primary) hypertension: Secondary | ICD-10-CM | POA: Insufficient documentation

## 2015-08-29 DIAGNOSIS — I70209 Unspecified atherosclerosis of native arteries of extremities, unspecified extremity: Secondary | ICD-10-CM

## 2015-08-29 DIAGNOSIS — Z87891 Personal history of nicotine dependence: Secondary | ICD-10-CM

## 2015-08-29 DIAGNOSIS — I701 Atherosclerosis of renal artery: Secondary | ICD-10-CM | POA: Insufficient documentation

## 2015-08-29 DIAGNOSIS — Z8249 Family history of ischemic heart disease and other diseases of the circulatory system: Secondary | ICD-10-CM

## 2015-08-29 DIAGNOSIS — J449 Chronic obstructive pulmonary disease, unspecified: Secondary | ICD-10-CM | POA: Insufficient documentation

## 2015-08-29 DIAGNOSIS — R350 Frequency of micturition: Secondary | ICD-10-CM | POA: Insufficient documentation

## 2015-08-29 DIAGNOSIS — Z85118 Personal history of other malignant neoplasm of bronchus and lung: Secondary | ICD-10-CM

## 2015-08-29 HISTORY — PX: LOWER EXTREMITY ANGIOGRAM: SHX5508

## 2015-08-29 HISTORY — DX: Obstructive sleep apnea (adult) (pediatric): G47.33

## 2015-08-29 HISTORY — PX: PERIPHERAL VASCULAR CATHETERIZATION: SHX172C

## 2015-08-29 LAB — POCT I-STAT, CHEM 8
BUN: 17 mg/dL (ref 6–20)
CALCIUM ION: 1.21 mmol/L (ref 1.13–1.30)
CREATININE: 1.4 mg/dL — AB (ref 0.61–1.24)
Chloride: 106 mmol/L (ref 101–111)
Glucose, Bld: 130 mg/dL — ABNORMAL HIGH (ref 65–99)
HCT: 35 % — ABNORMAL LOW (ref 39.0–52.0)
Hemoglobin: 11.9 g/dL — ABNORMAL LOW (ref 13.0–17.0)
Potassium: 3.9 mmol/L (ref 3.5–5.1)
SODIUM: 141 mmol/L (ref 135–145)
TCO2: 23 mmol/L (ref 0–100)

## 2015-08-29 LAB — GLUCOSE, CAPILLARY: Glucose-Capillary: 97 mg/dL (ref 65–99)

## 2015-08-29 SURGERY — ABDOMINAL AORTOGRAM

## 2015-08-29 MED ORDER — VANCOMYCIN HCL IN DEXTROSE 1-5 GM/200ML-% IV SOLN
1000.0000 mg | INTRAVENOUS | Status: AC
  Administered 2015-08-30: 1000 mg via INTRAVENOUS
  Filled 2015-08-29: qty 200

## 2015-08-29 MED ORDER — FENTANYL CITRATE (PF) 100 MCG/2ML IJ SOLN
INTRAMUSCULAR | Status: DC | PRN
  Administered 2015-08-29: 50 ug via INTRAVENOUS

## 2015-08-29 MED ORDER — IODIXANOL 320 MG/ML IV SOLN
INTRAVENOUS | Status: DC | PRN
  Administered 2015-08-29: 145 mL via INTRAVENOUS

## 2015-08-29 MED ORDER — MIDAZOLAM HCL 2 MG/2ML IJ SOLN
INTRAMUSCULAR | Status: DC | PRN
  Administered 2015-08-29: 1 mg via INTRAVENOUS

## 2015-08-29 MED ORDER — LIDOCAINE HCL (PF) 1 % IJ SOLN
INTRAMUSCULAR | Status: DC | PRN
  Administered 2015-08-29: 8 mL

## 2015-08-29 MED ORDER — LIDOCAINE HCL (PF) 1 % IJ SOLN
INTRAMUSCULAR | Status: AC
  Filled 2015-08-29: qty 30

## 2015-08-29 MED ORDER — SODIUM CHLORIDE 0.9 % IV SOLN: 1.0000 mL/kg/h | INTRAVENOUS | Status: DC

## 2015-08-29 MED ORDER — HEPARIN (PORCINE) IN NACL 2-0.9 UNIT/ML-% IJ SOLN
INTRAMUSCULAR | Status: AC
  Filled 2015-08-29: qty 1000

## 2015-08-29 MED ORDER — SODIUM CHLORIDE 0.9 % IV SOLN: INTRAVENOUS | Status: DC

## 2015-08-29 MED ORDER — CHLORHEXIDINE GLUCONATE CLOTH 2 % EX PADS: 6.0000 | MEDICATED_PAD | Freq: Once | CUTANEOUS | Status: DC

## 2015-08-29 MED ORDER — MIDAZOLAM HCL 2 MG/2ML IJ SOLN
INTRAMUSCULAR | Status: AC
  Filled 2015-08-29: qty 2

## 2015-08-29 MED ORDER — FENTANYL CITRATE (PF) 100 MCG/2ML IJ SOLN
INTRAMUSCULAR | Status: AC
  Filled 2015-08-29: qty 2

## 2015-08-29 MED ORDER — SODIUM CHLORIDE 0.9 % IV SOLN
INTRAVENOUS | Status: DC
  Administered 2015-08-29: 1000 mL via INTRAVENOUS

## 2015-08-29 SURGICAL SUPPLY — 10 items
CATH ACCU-VU SIZ PIG 5F 70CM (CATHETERS) ×1 IMPLANT
CATH CROSS OVER TEMPO 5F (CATHETERS) ×1 IMPLANT
CATH STRAIGHT 5FR 65CM (CATHETERS) ×1 IMPLANT
KIT MICROINTRODUCER STIFF 5F (SHEATH) ×1 IMPLANT
KIT PV (KITS) ×3 IMPLANT
SHEATH PINNACLE 5F 10CM (SHEATH) ×1 IMPLANT
SYR MEDRAD MARK V 150ML (SYRINGE) ×3 IMPLANT
TRANSDUCER W/STOPCOCK (MISCELLANEOUS) ×3 IMPLANT
TRAY PV CATH (CUSTOM PROCEDURE TRAY) ×3 IMPLANT
WIRE HITORQ VERSACORE ST 145CM (WIRE) ×1 IMPLANT

## 2015-08-29 NOTE — Progress Notes (Addendum)
Site area: rt groin fa sheath Site Prior to Removal:  Level 0 Pressure Applied For:  20 minutes Manual:   yes Patient Status During Pull:  status Post Pull Site:  Level  0 Post Pull Instructions Given:  yes Post Pull Pulses Present: yes Dressing Applied:  tegaderm Bedrest begins @  [phone]

## 2015-08-29 NOTE — H&P (View-Only) (Signed)
[facility] [place] HISTORY AND PHYSICAL   MRN : [id number]  History of Present Illness:   Troy Pelzel [name] is a 74 y.o. male who is s/p left femoropopliteal arterial bypass graft placed in 1994 with revision in 2001, and left CEA on 07/14/13 by Dr. [name].  He also has a small abdominal aortic aneurysm (3.4 cm in January 2014) that we have been monitoring.   He returns today for follow up.   Medical problems include hypertension, hyperlipidemia, glaucoma, and remote history of lung cancer.   After about 1/2 mile of walking he develops bilateral buttocks and calf pain, worse in the right, denies non healing wounds, no worse than his last visit.  Dr. Susa Day, ortho, determined that he has spinal stenosis, which he states has affected his walking and he has therefore decreased his walking significantly.  The patient had a preoperative TIA as manifested by left arm tremor and weakness, both legs giving way, and seeing purple spots in both eyes, he has no residual neurological deficits.   He had an exacerbation of atrial fib and a TIA in October 2016 as manifested by nausea, dizziness; he denies lateralizing symptoms, denies monocular loss of vision, denies speech difficulties, was evaluated at [facility]. Wife states he was discharged in atrial flutter, had a cardiac recorder implanted last week, sees [name].  Pt Diabetic: No  Pt smoker: former smoker, quit October, 2014  Pt meds include: Statin :Yes Betablocker: no ASA: No Other anticoagulants/antiplatelets: Eliquis for atrial fib prescribed by [name], Plavix stopped He also takes Pletal     Current Outpatient Prescriptions  Medication Sig Dispense Refill  . bimatoprost (LUMIGAN) 0.03 % ophthalmic solution Place 1 drop into the left eye at bedtime.     [name] BIOTIN PO Take 5,000 mcg by mouth daily.    . Bisacodyl (DULCOLAX PO) Take 1 capsule by mouth daily as needed (constipation).     . brimonidine  (ALPHAGAN P) 0.1 % SOLN Place 3 drops into the left eye 3 (three) times daily.     . cilostazol (PLETAL) 100 MG tablet Take 100 mg by mouth 2 (two) times daily.    [name] diltiazem (CARDIZEM CD) 120 MG 24 hr capsule Take 120 mg by mouth daily.    [name] diltiazem (CARDIZEM) 30 MG tablet Take 1 tablet by mouth every 4 hours AS NEEDED for HR >100 BP >100    . dorzolamide-timolol (COSOPT) 22.3-6.8 MG/ML ophthalmic solution Place 1 drop into the left eye 3 (three) times daily.     [name] doxazosin (CARDURA) 4 MG tablet Take 2 mg by mouth daily.    [name] dronedarone (MULTAQ) 400 MG tablet Take 400 mg by mouth 2 (two) times daily with a meal.    . ELIQUIS 5 MG TABS tablet TAKE 1 TABLET TWICE DAILY. (Patient taking differently: TAKE 5 MG BY MOUTH TWICE DAILY.) 180 tablet 3  . FLUoxetine (PROZAC) 20 MG capsule Take 20 mg by mouth daily.    . Ginkgo Biloba 40 MG TABS Take 40 mg by mouth daily.     . IRON PO Take 1 tablet by mouth daily.    [name] MELATONIN PO Take 3 mg by mouth at bedtime as needed (sleep).     . Multiple Vitamins-Minerals (CENTRUM SILVER PO) Take 1 tablet by mouth daily.    . ranitidine (ZANTAC) 150 MG tablet Take 150 mg by mouth daily as needed for heartburn.     . rosuvastatin (CRESTOR) 40 MG  tablet TAKE 1 TABLET ONCE DAILY. (Patient taking differently: TAKE 40 MG BY MOUTH ONCE DAILY.) 90 tablet 3   No current facility-administered medications for this visit.    Past Medical History  Diagnosis Date  . Hypertension   . Hyperlipidemia   . Glaucoma BOTH EYES    Dr Gershon Crane  . Hx of adenomatous colonic polyps 2005     X 2; 1 hyperplastic polyp; Dr Olevia Perches  . Impaired fasting glucose 2007    108; A1c5.4%  . History of lung cancer APRIL 2012  SQUAMOUS CELL---- S/P RIGHT LOWER LOBECTOMY AT [facility] --  NO CHEMORADIATION---  NO RECURRENCE     ONCOLOGIST- DR Tressie Stalker  LOV IN [facility] [address] 10-27-2012  . GERD (gastroesophageal reflux disease)   . BPH (benign prostatic hypertrophy)   . Frequency of urination   . Urgency  of urination   . Nocturia   . Lesion of bladder   . Microhematuria   . Arthritis   . History of basal cell carcinoma excision   . Exertional dyspnea   . Peripheral vascular disease ([place]) S/P ANGIOPLASTY AND STENTING    FOLLOWED  BY DR [name]  . PAD (peripheral artery disease) ([place]) ABI'S  JAN 2014  0.65 ON RIGHT ;  1.04 ON LEFT  . AAA (abdominal aortic aneurysm) ([facility]) LAST ABD. [name]  JAN 2014  3.4CM    MONITORED BY DR [name]  . Bilateral carotid artery stenosis DUPLEX 12-29-2012  BY DR [facility]    BILATERAL ICA STENOSIS 60-79%  . COPD (chronic obstructive pulmonary disease) ([place])   . Stroke [facility]) Jul 07, 2013    mini  TIA  . Cancer ([place]) 01-25-12    LUNG  . Atrial fibrillation ([place])   . Spinal stenosis Sept. 2015    Social History Social History  Substance Use Topics  . Smoking status: Former Smoker -- 0.50 packs/day for 50 years    Types: Cigarettes    Quit date: 07/28/2013  . Smokeless tobacco: Never Used     Comment: pt states that he is now taking chantix  . Alcohol Use: Yes     Comment:  socially, variable    Family History Family History  Problem Relation Age of Onset  . Stroke Mother     mini strokes  . Alcohol abuse Father   . Heart disease Father     MI after 46  . Stroke Father   . Hypertension Father   . Heart attack Father   . Heart disease Paternal Aunt     several  . Hypertension Paternal Aunt     several  . Stroke Paternal Aunt     several  . Stroke Paternal Uncle     several  . Heart disease Paternal Uncle     several;2 had MI pre 12    Surgical History Past Surgical History  Procedure Laterality Date  . Angio plasty      X 4 in legs  . Colon polyectomy    . Trabecular surgery      OS  . Lung lobectomy  01/24/2011     RIGHT UPPER LOBE  (SQUAMOUS CELL CARCINOMA) Dr Dorthea Cove , [facility]. No chemotherapyor radiation  . Laryngoscopy  06-27-2004    BX VOCAL CORD  (LEUKOPLAKIA)  PER PT NO ISSUES SINCE  . Cataract extraction w/ intraocular lens   implant, bilateral    . Basal cell carcinoma excision      MULTIPLE TIMES--  RIGHT FOREARM, CHEEKS, AND BACK  . Femoral-popliteal  bypass graft Left 1994  [facility]    AND 2001  [place]  . Cardiovascular stress test  12-08-2012  DR [facility]    NORMAL LEXISCAN WITH NO EXERCISE NUCLEAR STUDY/ EF 66%/   NO ISCHEMIA/ NO SIGNIFICANT CHANGE FROM PRIOR STUDY  . Transthoracic echocardiogram  12-29-2012  DR [facility]    MILD LVH/  LVSF NORMAL/ EF 22-02%/  GRADE I DIASTOLIC DYSFUNCTION  . Aortogram  07-27-2002    MILD DIFFUSE ILIAC ARTERY OCCLUSIVE DISEASE /  LEFT RENAL ARTERY 20%/ PATENT LEFT FEM-POP GRAFT/ MILD SFA AND POPLITEAL ARTERY OCCLUSIVE DISEASE W/ SEVERE KIDNEY OCCLUSIVE DISEASE  . Cystoscopy w/ retrogrades Bilateral 01/21/2013    Procedure: CYSTOSCOPY WITH RETROGRADE PYELOGRAM;  Surgeon: Molli Hazard, MD;  Location: [facility];  Service: Urology;  Laterality: Bilateral;   CYSTO, BLADDER BIOPSY, BILATERAL RETROGRADE PYELOGRAM  RAD TECH FROM RADIOLOGY PER JOY  . Cystoscopy with biopsy N/A 01/21/2013    Procedure: CYSTOSCOPY WITH BIOPSY;  Surgeon: Molli Hazard, MD;  Location: [facility];  Service: Urology;  Laterality: N/A;  . Endarterectomy Right 07/14/2013    Procedure: ENDARTERECTOMY CAROTID;  Surgeon: Angelia Mould, MD;  Location: [place];  Service: Vascular;  Laterality: Right;  . Patch angioplasty Right 07/14/2013    Procedure: PATCH ANGIOPLASTY;  Surgeon: Angelia Mould, MD;  Location[facility];  Service: Vascular;  Laterality: Right;  . Carotid endarterectomy Right 07-14-13    cea  . Carotid angiogram N/A 07/10/2013    Procedure: CAROTID ANGIOGRAM;  Surgeon: Elam Dutch, MD;  Location: [facility] CATH LAB;  Service: Cardiovascular;  Laterality: N/A;  . Ep implantable device N/A 08/12/2015    Procedure: Loop Recorder Insertion;  Surgeon: Thompson Grayer, MD;  Location: [place] CV LAB;  Service: Cardiovascular;  Laterality: N/A;     Allergies  Allergen Reactions  . Penicillins Hives, Shortness Of Breath and Other (See Comments)    Flushing  & dyspnea Because of a history of documented adverse serious drug reaction;Medi Alert bracelet  is recommended PCN reaction causing immediate rash, facial/tongue/throat swelling, SOB or lightheadedness with hypotension: Yes PCN reaction causing severe rash involving mucus membranes or skin necrosis: No PCN reaction occurring within the last 10 years: NO PCN reaction that required hospitalization: NO  . Sulfonamide Derivatives Shortness Of Breath and Other (See Comments)    Flushing & dyspnea Because of a history of documented adverse serious drug reaction;Medi Alert bracelet  is recommended  . Niacin-Lovastatin Er Other (See Comments)    Dyspnea, flushing  . Atorvastatin Other (See Comments)    Myalgias & athralgias- takes Crestor  . Sulfa Antibiotics Hives    Current Outpatient Prescriptions  Medication Sig Dispense Refill  . bimatoprost (LUMIGAN) 0.03 % ophthalmic solution Place 1 drop into the left eye at bedtime.     [name] BIOTIN PO Take 5,000 mcg by mouth daily.    . Bisacodyl (DULCOLAX PO) Take 1 capsule by mouth daily as needed (constipation).     . brimonidine (ALPHAGAN P) 0.1 % SOLN Place 3 drops into the left eye 3 (three) times daily.     . cilostazol (PLETAL) 100 MG tablet Take 100 mg by mouth 2 (two) times daily.    [name] diltiazem (CARDIZEM CD) 120 MG 24 hr capsule Take 120 mg by mouth daily.    [name] diltiazem (CARDIZEM) 30 MG tablet Take 1 tablet by mouth every 4 hours AS NEEDED for HR >100 BP >100    .  dorzolamide-timolol (COSOPT) 22.3-6.8 MG/ML ophthalmic solution Place 1 drop into the left eye 3 (three) times daily.     [name] doxazosin (CARDURA) 4 MG tablet Take 2 mg by mouth daily.    [name] dronedarone (MULTAQ) 400 MG tablet Take 400 mg by mouth 2 (two) times daily with a meal.    . ELIQUIS 5 MG TABS tablet TAKE 1 TABLET TWICE DAILY. (Patient taking differently: TAKE 5 MG  BY MOUTH TWICE DAILY.) 180 tablet 3  . FLUoxetine (PROZAC) 20 MG capsule Take 20 mg by mouth daily.    . Ginkgo Biloba 40 MG TABS Take 40 mg by mouth daily.     . IRON PO Take 1 tablet by mouth daily.    [name] MELATONIN PO Take 3 mg by mouth at bedtime as needed (sleep).     . Multiple Vitamins-Minerals (CENTRUM SILVER PO) Take 1 tablet by mouth daily.    . ranitidine (ZANTAC) 150 MG tablet Take 150 mg by mouth daily as needed for heartburn.     . rosuvastatin (CRESTOR) 40 MG tablet TAKE 1 TABLET ONCE DAILY. (Patient taking differently: TAKE 40 MG BY MOUTH ONCE DAILY.) 90 tablet 3   No current facility-administered medications for this visit.     REVIEW OF SYSTEMS: See HPI for pertinent positives and negatives.  Physical Examination Filed Vitals:   08/17/15 1201  BP: 139/71  Pulse: 71  Temp: 97.2 F (36.2 C)  TempSrc: Oral  Resp: 16  Height: '6\' 1"'$  (1.854 m)  Weight: 201 lb (91.173 kg)  SpO2: 97%   Body mass index is 26.52 kg/(m^2).  General: WDWN in NAD  Gait: Normal  HENT: WNL  Eyes: Pupils equal  Pulmonary: normal non-labored breathing , without Rales, rhonchi, wheezing  Cardiac: RRR, no murmur detected  Abdomen: soft, NT, no masses palpated. Skin: no rashes, no ulcers, no cellulitis.  VASCULAR EXAM  Carotid Bruits  Left  Right    Negative  Negative    VASCULAR EXAM:  Extremities without ischemic changes  without Gangrene; without open wounds.   LE Pulses  LEFT  RIGHT   FEMORAL  palpable  palpable   POPLITEAL  not palpable  not palpable   POSTERIOR TIBIAL  not palpable  not palpable   DORSALIS PEDIS  ANTERIOR TIBIAL  not palpable  not palpable    Musculoskeletal: no muscle wasting or atrophy; no peripheral edema.  Neurologic: A&O X 3; Appropriate Affect ;  SENSATION: normal;  MOTOR FUNCTION: 5/5 Symmetric, CN 2-12 intact except for uvula deviation slightly to the left  Speech is fluent/normal              Non-Invasive Vascular Imaging (08/10/15):  CEREBROVASCULAR DUPLEX EVALUATION    INDICATION: Follow-up carotid disease     PREVIOUS INTERVENTION(S): Right carotid endarterectomy 07/14/2013    DUPLEX EXAM:     RIGHT  LEFT  Peak Systolic Velocities (cm/s) End Diastolic Velocities (cm/s) Plaque LOCATION Peak Systolic Velocities (cm/s) End Diastolic Velocities (cm/s) Plaque  138 18  CCA PROXIMAL 169 26   84 17  CCA MID 140 24   93 17  CCA DISTAL 110 20 HT  329 22 HT ECA 223 22 HT  80 21  ICA PROXIMAL 134 33 HT  108 31  ICA MID 93 27   105 30  ICA DISTAL 82 25     NA ICA / CCA Ratio (PSV) 0.96  Antegrade  Vertebral Flow Antegrade    Brachial Systolic Pressure (mmHg)   Within  normal limits  Subclavian Artery Waveforms Within normal limits     Plaque Morphology:  HM = Homogeneous, HT = Heterogeneous, CP = Calcific Plaque, SP = Smooth Plaque, IP = Irregular Plaque     ADDITIONAL FINDINGS:     IMPRESSION: 1. Widely patent right carotid endarterectomy without evidence of restenosis or hyperplasia.  2. Evidence of <40% left internal carotid artery stenosis. 3. >50% stenosis of the right external carotid artery. 4. Bilateral vertebral artery is antegrade.    Compared to the previous exam:  No significant change compared to prior exam.      LOWER EXTREMITY ARTERIAL DUPLEX EVALUATION    INDICATION: Follow-up left lower extremity bypass graft     PREVIOUS INTERVENTION(S): Left femoropopliteal arterial bypass graft placed in 1994 with revision in 2001    DUPLEX EXAM:     RIGHT  LEFT   Peak Systolic Velocity (cm/s) Ratio (if abnormal) Waveform  Peak Systolic Velocity (cm/s) Ratio (if abnormal) Waveform     Inflow Artery 107  B     Proximal Anastomosis 686 6.4 Stenotic     Proximal Graft 359  Turbulent     Mid Graft 44  M      Distal Graft 38  M     Distal Anastomosis 34  M     Outflow Artery 31  M  0.58 Today's ABI / TBI 0.79  0.60 Previous ABI / TBI (12/28/2014 ) 0.88     Waveform:    M - Monophasic       B - Biphasic       T - Triphasic  If Ankle Brachial Index (ABI) or Toe Brachial Index (TBI) performed, please see complete report  ADDITIONAL FINDINGS:     IMPRESSION: Patent left lower extremity bypass graft with significant (>70%) stenosis of the proximal anastomosis.    Compared to the previous exam:  No previous exam at this facility for comparison.       ASSESSMENT:  Troy Brownlow [name] is a 74 y.o. male who is s/p left femoropopliteal arterial bypass graft placed in 1994 with revision in 2001, and left CEA on 07/14/13 by Dr. [name].  He also has a small abdominal aortic aneurysm that we have been monitoring (3.4 cm in January 2014). He has non life limiting bilateral hip and calf pain, relieved by rest. He has no ischemia in his feet/legs. He had a pre CEA TIA, then last month he had another TIA with an exacerbation of his atrial fib for which he was evaluated.  He takes Eliquis. 08/10/15 carotid duplex suggests a widely patent right carotid endarterectomy without evidence of restenosis or hyperplasia.  Evidence of <40% left internal carotid artery stenosis. No significant change compared to prior exam.    08/10/15 left LE arterial duplex suggests a patent left lower extremity bypass graft with significant (>70%) stenosis of the proximal anastomosis (686 cm/s).    AAA duplex is due about March 2017. Dr. [name] spoke with pt and wife.   Face to face time with patient was 25 minutes. Over 50% of this time was spent on counseling and coordination of care.   PLAN:   Based on today's exam and non-invasive vascular lab results, the patient will be scheduled for arteriogram by Dr. [name] on 08/29/15, with bilateral run off, possible intervention, access right femoral artery. I discussed in depth with the patient the nature of atherosclerosis, and emphasized the importance of maximal medical management including strict control of blood pressure, blood  glucose, and lipid levels, obtaining regular exercise, and cessation of smoking.  The patient is aware that without maximal medical management the underlying atherosclerotic disease process will progress, limiting the benefit of any interventions. Consideration for repair of AAA would be made when the size approaches 4.8 or 5.0 cm, growth > 1 cm/yr, and symptomatic status. The patient was given information about stroke prevention and what symptoms should prompt the patient to seek immediate medical care. The patient was given information about AAA including signs, symptoms, treatment,  what symptoms should prompt the patient to seek immediate medical care, and how to minimize the risk of enlargement and rupture of aneurysms. The patient was given information about PAD including signs, symptoms, treatment, what symptoms should prompt the patient to seek immediate medical care, and risk reduction measures to take. Thank you for allowing [name] to participate in this patient's care.  Troy Chambers, RN, MSN, FNP-C [facility] Office: [phone]  Clinic MD: [name]  08/17/2015 12:12 PM

## 2015-08-29 NOTE — Interval H&P Note (Signed)
History and Physical Interval Note:  08/29/2015 7:35 AM  [name]  has presented today for surgery, with the diagnosis of pad  The various methods of treatment have been discussed with the patient and family. After consideration of risks, benefits and other options for treatment, the patient has consented to  Procedure(s): Abdominal Aortogram (N/A) as a surgical intervention .  The patient's history has been reviewed, patient examined, no change in status, stable for surgery.  I have reviewed the patient's chart and labs.  Questions were answered to the patient's satisfaction.     Troy [name]

## 2015-08-29 NOTE — Telephone Encounter (Signed)
Please let patient know that they have sleep apnea and recommend CPAP titration. Please set up titration in the sleep lab. 

## 2015-08-29 NOTE — Discharge Instructions (Signed)
Angiogram, Care After °Refer to this sheet in the next few weeks. These instructions provide you with information about caring for yourself after your procedure. Your health care provider may also give you more specific instructions. Your treatment has been planned according to current medical practices, but problems sometimes occur. Call your health care provider if you have any problems or questions after your procedure. °WHAT TO EXPECT AFTER THE PROCEDURE °After your procedure, it is typical to have the following: °· Bruising at the catheter insertion site that usually fades within 1-2 weeks. °· Blood collecting in the tissue (hematoma) that may be painful to the touch. It should usually decrease in size and tenderness within 1-2 weeks. °HOME CARE INSTRUCTIONS °· Take medicines only as directed by your health care provider. °· You may shower 24-48 hours after the procedure or as directed by your health care provider. Remove the bandage (dressing) and gently wash the site with plain soap and water. Pat the area dry with a clean towel. Do not rub the site, because this may cause bleeding. °· Do not take baths, swim, or use a hot tub until your health care provider approves. °· Check your insertion site every day for redness, swelling, or drainage. °· Do not apply powder or lotion to the site. °· Do not lift over 10 lb (4.5 kg) for 5 days after your procedure or as directed by your health care provider. °· Ask your health care provider when it is okay to: °¨ Return to work or school. °¨ Resume usual physical activities or sports. °¨ Resume sexual activity. °· Do not drive home if you are discharged the same day as the procedure. Have someone else drive you. °· You may drive 24 hours after the procedure unless otherwise instructed by your health care provider. °· Do not operate machinery or power tools for 24 hours after the procedure or as directed by your health care provider. °· If your procedure was done as an  outpatient procedure, which means that you went home the same day as your procedure, a responsible adult should be with you for the first 24 hours after you arrive home. °· Keep all follow-up visits as directed by your health care provider. This is important. °SEEK MEDICAL CARE IF: °· You have a fever. °· You have chills. °· You have increased bleeding from the catheter insertion site. Hold pressure on the site. °SEEK IMMEDIATE MEDICAL CARE IF: °· You have unusual pain at the catheter insertion site. °· You have redness, warmth, or swelling at the catheter insertion site. °· You have drainage (other than a small amount of blood on the dressing) from the catheter insertion site. °· The catheter insertion site is bleeding, and the bleeding does not stop after 30 minutes of holding steady pressure on the site. °· The area near or just beyond the catheter insertion site becomes pale, cool, tingly, or numb. °  °This information is not intended to replace advice given to you by your health care provider. Make sure you discuss any questions you have with your health care provider. °  °Document Released: 04/05/2005 Document Revised: 10/08/2014 Document Reviewed: 02/18/2013 °Elsevier Interactive Patient Education ©2016 [employer] ° °

## 2015-08-29 NOTE — Sleep Study (Signed)
    Patient Name: Alastor, Kneale MRN: [id number] Study Date: 08/23/2015 Gender: Male D.O.B: 1940/10/09 Age (years): 32 Referring Provider: Sherran Needs Interpreting Physician: Fransico Him MD, ABSM RPSGT: Madelon Lips  Weight (lbs): 200 Height (inches): 72 BMI: 27 Neck Size: 18.00  CLINICAL INFORMATION Sleep Study Type: NPSG Indication for sleep study: Snoring Epworth Sleepiness Score: 5  SLEEP STUDY TECHNIQUE As per the AASM Manual for the Scoring of Sleep and Associated Events v2.3 (April 2016) with a hypopnea requiring 4% desaturations. The channels recorded and monitored were frontal, central and occipital EEG, electrooculogram (EOG), submentalis EMG (chin), nasal and oral airflow, thoracic and abdominal wall motion, anterior tibialis EMG, snore microphone, electrocardiogram, and pulse oximetry.  MEDICATIONS Patient's medications include: Lumigan eye gtt, Biotin, Dulcolax, Alphagan eye gtts, Pletal, Cardizem, Doxazosin, Multaq, Eliquis, Aleve, Zantac, Crestor. Medications self-administered by patient during sleep study : No sleep medicine administered.  SLEEP ARCHITECTURE The study was initiated at 10:55:04 PM and ended at 4:59:57 AM. Sleep onset time was 37.2 minutes and the sleep efficiency was mildly reduced at 82.3%. The total sleep time was 300.2 minutes. Stage REM latency was prolonged at 172.5 minutes. The patient spent 15.21% of the night in stage N1 sleep, 76.13% in stage N2 sleep, 0.00% in stage N3 and 8.66% in REM. Alpha intrusion was absent. Supine sleep was 25.04%.  RESPIRATORY PARAMETERS The overall apnea/hypopnea index (AHI) was 6.0 per hour. There were 2 total apneas, including 2 obstructive, 0 central and 0 mixed apneas. There were 28 hypopneas and 4 RERAs. The AHI during Stage REM sleep was 30.0 per hour. AHI while supine was 4.0 per hour. The mean oxygen saturation was 92.16%. The minimum SpO2 during sleep was 84.00%.  Total time spent with O2  saturation <88% was 5.3 minutes.   Moderate snoring was noted during this study. CARDIAC DATA The 2 lead EKG demonstrated sinus rhythm. The mean heart rate was 62.61 beats per minute. Other EKG findings include: PVCs, PACs and atrial triplets.  LEG MOVEMENT DATA The total PLMS were 34 with a resulting PLMS index of 6.80. Associated arousal with leg movement index was 0.2 .  IMPRESSIONS - Mild obstructive sleep apnea occurred during this study (AHI = 6.0/h). - No significant central sleep apnea occurred during this study (CAI = 0.0/h). - Mild oxygen desaturation was noted during this study (Min O2 = 84.00%). - The patient snored with Moderate snoring volume. - EKG findings include PVCs. - Mild periodic limb movements of sleep occurred during the study. No significant associated arousals.  DIAGNOSIS - Obstructive Sleep Apnea (327.23 [G47.33 ICD-10]) - Nocturnal Hypoxemia (327.26 [G47.36 ICD-10])  RECOMMENDATIONS - Very mild obstructive sleep apnea overall but given significant oxygen desaturations in the setting of paroxysmal atrial fibrillation and severe OSA in REM sleep, recommend proceeding with CPAP titration.   - Avoid alcohol, sedatives and other CNS depressants that may worsen sleep apnea and disrupt normal sleep architecture. - Sleep hygiene should be reviewed to assess factors that may improve sleep quality. - Weight management and regular exercise should be initiated or continued if appropriate.    Brock, American Board of Sleep Medicine  ELECTRONICALLY SIGNED ON:  08/29/2015, 11:33 AM [place] PH: [phone]   FX: [phone] [place]

## 2015-08-29 NOTE — Progress Notes (Signed)
Pt has history of Atrial fibrillation, had loop recorder implanted on 08/12/15. Spoke with pt's wife for pre-op call.

## 2015-08-29 NOTE — Telephone Encounter (Signed)
Discussed pt with [name] who contacted Dr. [name]. Pt ok to hold Eliquis today 11/28 and tomorrow 11/29 for revision of bypass graft tomorrow. Dr. [name] will give Lovenox injection tomorrow PM and pt will start on Eliquis again on Wednesday 11/30. This will minimize pt's time off of Eliquis to 2 1/2 days given previous TIA in 2014. Will route phone note to [facility] with Dr. [name].

## 2015-08-29 NOTE — Op Note (Signed)
PATIENT: Troy Kenner [name]   MRN: [id number] DOB: 05/23/1941    DATE OF PROCEDURE: 08/29/2015  INDICATIONS: Troy Hillock [name] is a 74 y.o. male who underwent a previous left femoral to below knee popliteal artery bypass in [place]. I do have an op note from 11/15/1999, which shows he underwent a revision of his bypass graft with a left popliteal and tibial endarterectomy and extension of the bypass using reversed basilic arm vein. He has been followed in our office with a small aneurysm and also we have been following his bypass graft. His most recent duplex scan showed markedly elevated velocities in the proximal bypass graft suggesting a critical proximal stenosis. He presents for arteriography today to further evaluate this.  PROCEDURE:  1. Ultrasound-guided access to the right common femoral artery 2. Aortogram with bilateral iliac arteriogram 3. Selective catheterization of the left external iliac artery with left lower extremity runoff 4. Retrograde right femoral arteriogram  SURGEON: Judeth Cornfield. [name], MD, FACS  ANESTHESIA: local with sedation   EBL: minimal  TECHNIQUE: The patient was taken to the peripheral vascular lab and received 1 mg of Versed and 50 g of fentanyl. Both groins were prepped and draped in the usual sterile fashion. Under ultrasound guidance, after the skin was anesthetized, the right common femoral artery was cannulated and a guidewire introduced into the infrarenal aorta under fluoroscopic control. A 5 French sheath was placed and then a pigtail catheter positioned at the L1 vertebral body. Flush aortogram was obtained. The catheter was and repositioned above the aortic bifurcation and oblique iliac projection was obtained. Next the pigtail catheter was exchanged for a crossover catheter which was positioned into the left common iliac artery. The wire was advanced into the external iliac artery and the crossover catheter exchanged for a straight catheter. Selective  left external iliac arteriogram was obtained with left lower extremity runoff. The straight catheter was then removed over a wire. A retrograde right femoral arteriogram was obtained with right lower extremity runoff. A total of 145 cc of contrast was used.  FINDINGS:  1. There is an approximately 50% right renal artery stenosis and a 60% left renal artery stenosis. 2. The infrarenal aorta is widely patent without focal stenosis. There is a small aneurysm of the distal aorta. 3. There is diffuse disease and bilateral common iliac arteries. 4. On the left side, which is the symptomatic side, the hypogastric artery is patent. The external iliac artery and common femoral arteries are patent. The left superficial femoral artery is occluded at its origin. The deep femoral arteries patent with some disease proximally. The left femoral to below knee popliteal artery bypass graft is patent. There is a tight focal stenosis right at the origin of the bypass graft. I did not think this would be amenable to angioplasty given the proximity to the anastomosis and the disease in the deep femoral artery. The tibioperoneal trunk, posterior tibial and peroneal arteries are patent on the left. The anterior tibial artery is occluded. 5. On the right side, the external iliac artery, common femoral, and deep femoral arteries are patent. The hypogastric artery on the right is occluded. There is severe diffuse disease of the superficial femoral artery with occlusion of the popliteal artery above the knee. There is reconstitution of the below-knee popliteal artery with two-vessel runoff on the right via the posterior tibial and peroneal arteries. The posterior tibial artery is occluded.  CLINICAL NOTE: The patient will be scheduled for vein patch angioplasty of the  proximal stenosis in his left femoropopliteal bypass graft. We have discussed indications for the procedure and the potential complications including bleeding and wound  healing problems. He understands without revision he would be at very high risk for graft thrombosis.  Deitra Mayo, MD, FACS [facility]  DATE OF DICTATION:   08/29/2015

## 2015-08-29 NOTE — Telephone Encounter (Signed)
Received call from [facility] with vein and vascular. Pt had his angiogram today, calling for clearance to continue holding Eliquis today and tomorrow because Dr. [name] will be doing a revision of pt's fem/pop bypass graft. Pt has CHADS2 score of 3 with previous TIA. Originally gave clearance to hold Eliquis 1 day given previous TIA. Will route to [name] since Coumadin clinic did not feel comfortable holding Eliquis 3 days with TIA history.

## 2015-08-30 ENCOUNTER — Inpatient Hospital Stay (HOSPITAL_COMMUNITY): Payer: BLUE CROSS/BLUE SHIELD | Admitting: Certified Registered Nurse Anesthetist

## 2015-08-30 ENCOUNTER — Encounter (HOSPITAL_COMMUNITY): Payer: Self-pay | Admitting: Certified Registered Nurse Anesthetist

## 2015-08-30 ENCOUNTER — Encounter (HOSPITAL_COMMUNITY)
Admission: RE | Disposition: A | Discharge status: Home / Self Care | Payer: Self-pay | Source: Ambulatory Visit | Attending: Vascular Surgery

## 2015-08-30 ENCOUNTER — Inpatient Hospital Stay (HOSPITAL_COMMUNITY)
Admission: RE | Admit: 2015-08-30 | Discharge: 2015-08-31 | DRG: 254 | Disposition: A | Discharge status: Home / Self Care | Payer: BLUE CROSS/BLUE SHIELD | Source: Ambulatory Visit | Attending: Vascular Surgery | Admitting: Vascular Surgery

## 2015-08-30 DIAGNOSIS — Z961 Presence of intraocular lens: Secondary | ICD-10-CM | POA: Diagnosis present

## 2015-08-30 DIAGNOSIS — H409 Unspecified glaucoma: Secondary | ICD-10-CM | POA: Diagnosis present

## 2015-08-30 DIAGNOSIS — I739 Peripheral vascular disease, unspecified: Secondary | ICD-10-CM

## 2015-08-30 DIAGNOSIS — I10 Essential (primary) hypertension: Secondary | ICD-10-CM | POA: Diagnosis present

## 2015-08-30 DIAGNOSIS — M1990 Unspecified osteoarthritis, unspecified site: Secondary | ICD-10-CM | POA: Diagnosis present

## 2015-08-30 DIAGNOSIS — E785 Hyperlipidemia, unspecified: Secondary | ICD-10-CM | POA: Diagnosis present

## 2015-08-30 DIAGNOSIS — I714 Abdominal aortic aneurysm, without rupture: Secondary | ICD-10-CM | POA: Diagnosis present

## 2015-08-30 DIAGNOSIS — Z8249 Family history of ischemic heart disease and other diseases of the circulatory system: Secondary | ICD-10-CM

## 2015-08-30 DIAGNOSIS — Z8673 Personal history of transient ischemic attack (TIA), and cerebral infarction without residual deficits: Secondary | ICD-10-CM | POA: Diagnosis not present

## 2015-08-30 DIAGNOSIS — Z87891 Personal history of nicotine dependence: Secondary | ICD-10-CM

## 2015-08-30 DIAGNOSIS — Z9841 Cataract extraction status, right eye: Secondary | ICD-10-CM

## 2015-08-30 DIAGNOSIS — I4891 Unspecified atrial fibrillation: Secondary | ICD-10-CM | POA: Diagnosis present

## 2015-08-30 DIAGNOSIS — Z823 Family history of stroke: Secondary | ICD-10-CM | POA: Diagnosis not present

## 2015-08-30 DIAGNOSIS — Z811 Family history of alcohol abuse and dependence: Secondary | ICD-10-CM | POA: Diagnosis not present

## 2015-08-30 DIAGNOSIS — Z9842 Cataract extraction status, left eye: Secondary | ICD-10-CM | POA: Diagnosis not present

## 2015-08-30 DIAGNOSIS — G4733 Obstructive sleep apnea (adult) (pediatric): Secondary | ICD-10-CM | POA: Diagnosis present

## 2015-08-30 DIAGNOSIS — K219 Gastro-esophageal reflux disease without esophagitis: Secondary | ICD-10-CM | POA: Diagnosis present

## 2015-08-30 DIAGNOSIS — Z85828 Personal history of other malignant neoplasm of skin: Secondary | ICD-10-CM

## 2015-08-30 DIAGNOSIS — I70402 Unspecified atherosclerosis of autologous vein bypass graft(s) of the extremities, left leg: Secondary | ICD-10-CM | POA: Diagnosis not present

## 2015-08-30 DIAGNOSIS — I701 Atherosclerosis of renal artery: Secondary | ICD-10-CM | POA: Diagnosis present

## 2015-08-30 DIAGNOSIS — Z902 Acquired absence of lung [part of]: Secondary | ICD-10-CM | POA: Diagnosis not present

## 2015-08-30 DIAGNOSIS — M4800 Spinal stenosis, site unspecified: Secondary | ICD-10-CM | POA: Diagnosis present

## 2015-08-30 DIAGNOSIS — J449 Chronic obstructive pulmonary disease, unspecified: Secondary | ICD-10-CM | POA: Diagnosis present

## 2015-08-30 DIAGNOSIS — N400 Enlarged prostate without lower urinary tract symptoms: Secondary | ICD-10-CM | POA: Diagnosis present

## 2015-08-30 DIAGNOSIS — Y832 Surgical operation with anastomosis, bypass or graft as the cause of abnormal reaction of the patient, or of later complication, without mention of misadventure at the time of the procedure: Secondary | ICD-10-CM | POA: Diagnosis present

## 2015-08-30 DIAGNOSIS — Z85118 Personal history of other malignant neoplasm of bronchus and lung: Secondary | ICD-10-CM | POA: Diagnosis not present

## 2015-08-30 DIAGNOSIS — Z7902 Long term (current) use of antithrombotics/antiplatelets: Secondary | ICD-10-CM | POA: Diagnosis not present

## 2015-08-30 DIAGNOSIS — T82858A Stenosis of vascular prosthetic devices, implants and grafts, initial encounter: Principal | ICD-10-CM | POA: Diagnosis present

## 2015-08-30 DIAGNOSIS — Y92009 Unspecified place in unspecified non-institutional (private) residence as the place of occurrence of the external cause: Secondary | ICD-10-CM

## 2015-08-30 HISTORY — PX: FEMORAL-POPLITEAL BYPASS GRAFT: SHX937

## 2015-08-30 HISTORY — DX: Other complications of anesthesia, initial encounter: T88.59XA

## 2015-08-30 HISTORY — PX: PATCH ANGIOPLASTY: SHX6230

## 2015-08-30 HISTORY — DX: Adverse effect of unspecified anesthetic, initial encounter: T41.45XA

## 2015-08-30 LAB — CREATININE, SERUM: Creatinine, Ser: 1.1 mg/dL (ref 0.61–1.24)

## 2015-08-30 LAB — COMPREHENSIVE METABOLIC PANEL
ALBUMIN: 2.9 g/dL — AB (ref 3.5–5.0)
ALT: 28 U/L (ref 17–63)
ANION GAP: 6 (ref 5–15)
AST: 24 U/L (ref 15–41)
Alkaline Phosphatase: 52 U/L (ref 38–126)
BUN: 9 mg/dL (ref 6–20)
CHLORIDE: 109 mmol/L (ref 101–111)
CO2: 26 mmol/L (ref 22–32)
Calcium: 9 mg/dL (ref 8.9–10.3)
Creatinine, Ser: 1.29 mg/dL — ABNORMAL HIGH (ref 0.61–1.24)
GFR calc Af Amer: >60 mL/min (ref >60)
GFR calc non Af Amer: 53 mL/min — ABNORMAL LOW (ref >60)
GLUCOSE: 113 mg/dL — AB (ref 65–99)
POTASSIUM: 4.3 mmol/L (ref 3.5–5.1)
SODIUM: 141 mmol/L (ref 135–145)
Total Bilirubin: 0.3 mg/dL (ref 0.3–1.2)
Total Protein: 6.2 g/dL — ABNORMAL LOW (ref 6.5–8.1)

## 2015-08-30 LAB — CBC
HCT: 33.3 % — ABNORMAL LOW (ref 39.0–52.0)
HEMATOCRIT: 33.6 % — AB (ref 39.0–52.0)
Hemoglobin: 10.8 g/dL — ABNORMAL LOW (ref 13.0–17.0)
Hemoglobin: 10.7 g/dL — ABNORMAL LOW (ref 13.0–17.0)
MCH: 28 pg (ref 26.0–34.0)
MCH: 27.5 pg (ref 26.0–34.0)
MCHC: 32.4 g/dL (ref 30.0–36.0)
MCHC: 31.8 g/dL (ref 30.0–36.0)
MCV: 86.3 fL (ref 78.0–100.0)
MCV: 86.4 fL (ref 78.0–100.0)
PLATELETS: 202 10*3/uL (ref 150–400)
PLATELETS: 206 10*3/uL (ref 150–400)
RBC: 3.86 MIL/uL — ABNORMAL LOW (ref 4.22–5.81)
RBC: 3.89 MIL/uL — AB (ref 4.22–5.81)
RDW: 15 % (ref 11.5–15.5)
RDW: 14.9 % (ref 11.5–15.5)
WBC: 6.8 10*3/uL (ref 4.0–10.5)
WBC: 6.1 10*3/uL (ref 4.0–10.5)

## 2015-08-30 LAB — URINALYSIS, ROUTINE W REFLEX MICROSCOPIC
BILIRUBIN URINE: NEGATIVE
Glucose, UA: NEGATIVE mg/dL
Hgb urine dipstick: NEGATIVE
KETONES UR: NEGATIVE mg/dL
LEUKOCYTES UA: NEGATIVE
NITRITE: NEGATIVE
PROTEIN: NEGATIVE mg/dL
Specific Gravity, Urine: 1.023 (ref 1.005–1.030)
pH: 5.5 (ref 5.0–8.0)

## 2015-08-30 LAB — SURGICAL PCR SCREEN
MRSA, PCR: NEGATIVE
STAPHYLOCOCCUS AUREUS: NEGATIVE

## 2015-08-30 LAB — APTT: APTT: 30 s (ref 24–37)

## 2015-08-30 LAB — PROTIME-INR
INR: 1.08 (ref 0.00–1.49)
Prothrombin Time: 14.2 seconds (ref 11.6–15.2)

## 2015-08-30 LAB — TYPE AND SCREEN
ABO/RH(D): A POS
ANTIBODY SCREEN: NEGATIVE

## 2015-08-30 SURGERY — BYPASS GRAFT FEMORAL-POPLITEAL ARTERY
Anesthesia: General | Site: Leg Lower | Laterality: Left

## 2015-08-30 MED ORDER — ONDANSETRON HCL 4 MG/2ML IJ SOLN: 4.0000 mg | Freq: Four times a day (QID) | INTRAMUSCULAR | Status: DC | PRN

## 2015-08-30 MED ORDER — SODIUM CHLORIDE 0.9 % IJ SOLN
INTRAMUSCULAR | Status: AC
  Filled 2015-08-30: qty 10

## 2015-08-30 MED ORDER — ONDANSETRON HCL 4 MG/2ML IJ SOLN
INTRAMUSCULAR | Status: DC | PRN
  Administered 2015-08-30: 4 mg via INTRAVENOUS

## 2015-08-30 MED ORDER — HEPARIN SODIUM (PORCINE) 1000 UNIT/ML IJ SOLN
INTRAMUSCULAR | Status: DC | PRN
  Administered 2015-08-30: 8000 [IU] via INTRAVENOUS

## 2015-08-30 MED ORDER — SODIUM CHLORIDE 0.9 % IV SOLN: 500.0000 mL | Freq: Once | INTRAVENOUS | Status: DC | PRN

## 2015-08-30 MED ORDER — OXYCODONE HCL 5 MG PO TABS
5.0000 mg | ORAL_TABLET | ORAL | Status: DC | PRN
  Administered 2015-08-30: 10 mg via ORAL

## 2015-08-30 MED ORDER — DOXAZOSIN MESYLATE 2 MG PO TABS
2.0000 mg | ORAL_TABLET | Freq: Every day | ORAL | Status: DC
  Filled 2015-08-30: qty 1

## 2015-08-30 MED ORDER — SUGAMMADEX SODIUM 200 MG/2ML IV SOLN
INTRAVENOUS | Status: AC
  Filled 2015-08-30: qty 2

## 2015-08-30 MED ORDER — VANCOMYCIN HCL IN DEXTROSE 1-5 GM/200ML-% IV SOLN
1000.0000 mg | Freq: Two times a day (BID) | INTRAVENOUS | Status: AC
  Administered 2015-08-30 – 2015-08-31 (×2): 1000 mg via INTRAVENOUS
  Filled 2015-08-30 (×2): qty 200

## 2015-08-30 MED ORDER — MAGNESIUM HYDROXIDE 400 MG/5ML PO SUSP: 30.0000 mL | Freq: Every day | ORAL | Status: DC | PRN

## 2015-08-30 MED ORDER — SODIUM CHLORIDE 0.9 % IV SOLN
INTRAVENOUS | Status: DC
  Administered 2015-08-30: 15:00:00 via INTRAVENOUS

## 2015-08-30 MED ORDER — APIXABAN 5 MG PO TABS: 5.0000 mg | ORAL_TABLET | Freq: Two times a day (BID) | ORAL | Status: DC

## 2015-08-30 MED ORDER — DIPHENHYDRAMINE HCL 50 MG/ML IJ SOLN
INTRAMUSCULAR | Status: AC
  Filled 2015-08-30: qty 1

## 2015-08-30 MED ORDER — SUCCINYLCHOLINE CHLORIDE 20 MG/ML IJ SOLN
INTRAMUSCULAR | Status: AC
  Filled 2015-08-30: qty 1

## 2015-08-30 MED ORDER — ROSUVASTATIN CALCIUM 40 MG PO TABS: 40.0000 mg | ORAL_TABLET | Freq: Every day | ORAL | Status: DC

## 2015-08-30 MED ORDER — HYDROMORPHONE HCL 1 MG/ML IJ SOLN
0.2500 mg | INTRAMUSCULAR | Status: DC | PRN
  Administered 2015-08-30: 1 mg via INTRAVENOUS

## 2015-08-30 MED ORDER — ACETAMINOPHEN 325 MG PO TABS
325.0000 mg | ORAL_TABLET | ORAL | Status: DC | PRN
  Administered 2015-08-30 – 2015-08-31 (×2): 650 mg via ORAL
  Filled 2015-08-30 (×2): qty 2

## 2015-08-30 MED ORDER — LABETALOL HCL 5 MG/ML IV SOLN: 10.0000 mg | INTRAVENOUS | Status: DC | PRN

## 2015-08-30 MED ORDER — ONDANSETRON HCL 4 MG/2ML IJ SOLN
INTRAMUSCULAR | Status: AC
  Filled 2015-08-30: qty 2

## 2015-08-30 MED ORDER — THROMBIN 20000 UNITS EX SOLR
CUTANEOUS | Status: AC
  Filled 2015-08-30: qty 20000

## 2015-08-30 MED ORDER — DEXAMETHASONE SODIUM PHOSPHATE 4 MG/ML IJ SOLN
INTRAMUSCULAR | Status: AC
  Filled 2015-08-30: qty 1

## 2015-08-30 MED ORDER — DEXAMETHASONE SODIUM PHOSPHATE 4 MG/ML IJ SOLN
INTRAMUSCULAR | Status: DC | PRN
  Administered 2015-08-30: 4 mg via INTRAVENOUS

## 2015-08-30 MED ORDER — ROCURONIUM BROMIDE 100 MG/10ML IV SOLN
INTRAVENOUS | Status: DC | PRN
  Administered 2015-08-30: 10 mg via INTRAVENOUS
  Administered 2015-08-30: 20 mg via INTRAVENOUS
  Administered 2015-08-30: 50 mg via INTRAVENOUS

## 2015-08-30 MED ORDER — DIPHENHYDRAMINE HCL 50 MG/ML IJ SOLN
INTRAMUSCULAR | Status: DC | PRN
  Administered 2015-08-30: 12.5 mg via INTRAVENOUS

## 2015-08-30 MED ORDER — FAMOTIDINE 20 MG PO TABS
20.0000 mg | ORAL_TABLET | Freq: Two times a day (BID) | ORAL | Status: DC
  Administered 2015-08-30 (×2): 20 mg via ORAL
  Filled 2015-08-30 (×2): qty 1

## 2015-08-30 MED ORDER — SODIUM CHLORIDE 0.9 % IV SOLN: INTRAVENOUS | Status: DC

## 2015-08-30 MED ORDER — PROPOFOL 500 MG/50ML IV EMUL
INTRAVENOUS | Status: DC | PRN
  Administered 2015-08-30: 10 ug/kg/min via INTRAVENOUS

## 2015-08-30 MED ORDER — BIOTIN 5000 MCG PO TABS: 5000.0000 ug | ORAL_TABLET | Freq: Every day | ORAL | Status: DC

## 2015-08-30 MED ORDER — MELATONIN 5 MG PO TABS: 5.0000 mg | ORAL_TABLET | Freq: Every day | ORAL | Status: DC

## 2015-08-30 MED ORDER — OXYCODONE HCL 5 MG PO TABS
ORAL_TABLET | ORAL | Status: AC
  Filled 2015-08-30: qty 2

## 2015-08-30 MED ORDER — HYDROMORPHONE HCL 1 MG/ML IJ SOLN
INTRAMUSCULAR | Status: AC
  Filled 2015-08-30: qty 1

## 2015-08-30 MED ORDER — EPHEDRINE SULFATE 50 MG/ML IJ SOLN
INTRAMUSCULAR | Status: DC | PRN
  Administered 2015-08-30 (×2): 5 mg via INTRAVENOUS
  Administered 2015-08-30: 10 mg via INTRAVENOUS
  Administered 2015-08-30 (×2): 5 mg via INTRAVENOUS
  Administered 2015-08-30: 10 mg via INTRAVENOUS

## 2015-08-30 MED ORDER — FENTANYL CITRATE (PF) 100 MCG/2ML IJ SOLN
INTRAMUSCULAR | Status: DC | PRN
Start: 2015-08-30 — End: 2015-08-30
  Administered 2015-08-30: 50 ug via INTRAVENOUS
  Administered 2015-08-30: 100 ug via INTRAVENOUS
  Administered 2015-08-30 (×2): 25 ug via INTRAVENOUS

## 2015-08-30 MED ORDER — BRIMONIDINE TARTRATE 0.2 % OP SOLN
1.0000 [drp] | Freq: Three times a day (TID) | OPHTHALMIC | Status: DC
  Administered 2015-08-30 (×2): 1 [drp] via OPHTHALMIC
  Filled 2015-08-30: qty 5

## 2015-08-30 MED ORDER — SODIUM CHLORIDE 0.9 % IV SOLN
INTRAVENOUS | Status: DC | PRN
  Administered 2015-08-30: 500 mL

## 2015-08-30 MED ORDER — ALUM & MAG HYDROXIDE-SIMETH 200-200-20 MG/5ML PO SUSP: 15.0000 mL | ORAL | Status: DC | PRN

## 2015-08-30 MED ORDER — DORZOLAMIDE HCL-TIMOLOL MAL 2-0.5 % OP SOLN
1.0000 [drp] | Freq: Three times a day (TID) | OPHTHALMIC | Status: DC
  Administered 2015-08-30 (×2): 1 [drp] via OPHTHALMIC
  Filled 2015-08-30: qty 10

## 2015-08-30 MED ORDER — POLYSACCHARIDE IRON COMPLEX 150 MG PO CAPS
150.0000 mg | ORAL_CAPSULE | Freq: Every day | ORAL | Status: DC
  Filled 2015-08-30 (×2): qty 1

## 2015-08-30 MED ORDER — BISACODYL 5 MG PO TBEC: 5.0000 mg | DELAYED_RELEASE_TABLET | Freq: Every day | ORAL | Status: DC | PRN

## 2015-08-30 MED ORDER — PROPOFOL 10 MG/ML IV BOLUS
INTRAVENOUS | Status: AC
  Filled 2015-08-30: qty 40

## 2015-08-30 MED ORDER — LIDOCAINE HCL (CARDIAC) 20 MG/ML IV SOLN
INTRAVENOUS | Status: DC | PRN
  Administered 2015-08-30: 100 mg via INTRAVENOUS

## 2015-08-30 MED ORDER — POTASSIUM CHLORIDE CRYS ER 20 MEQ PO TBCR
20.0000 meq | EXTENDED_RELEASE_TABLET | Freq: Every day | ORAL | Status: DC | PRN
Start: 2015-08-30 — End: 2015-08-31

## 2015-08-30 MED ORDER — HEPARIN SODIUM (PORCINE) 1000 UNIT/ML IJ SOLN
INTRAMUSCULAR | Status: AC
  Filled 2015-08-30: qty 1

## 2015-08-30 MED ORDER — PROMETHAZINE HCL 25 MG/ML IJ SOLN: 6.2500 mg | INTRAMUSCULAR | Status: DC | PRN

## 2015-08-30 MED ORDER — FENTANYL CITRATE (PF) 250 MCG/5ML IJ SOLN
INTRAMUSCULAR | Status: AC
  Filled 2015-08-30: qty 5

## 2015-08-30 MED ORDER — MELATONIN 3 MG PO TABS
6.0000 mg | ORAL_TABLET | Freq: Every day | ORAL | Status: DC
  Administered 2015-08-30: 6 mg via ORAL
  Filled 2015-08-30 (×2): qty 2

## 2015-08-30 MED ORDER — LIDOCAINE HCL (CARDIAC) 20 MG/ML IV SOLN
INTRAVENOUS | Status: AC
  Filled 2015-08-30: qty 5

## 2015-08-30 MED ORDER — DRONEDARONE HCL 400 MG PO TABS
400.0000 mg | ORAL_TABLET | Freq: Two times a day (BID) | ORAL | Status: DC
  Administered 2015-08-30: 400 mg via ORAL
  Filled 2015-08-30 (×4): qty 1

## 2015-08-30 MED ORDER — PROPOFOL 10 MG/ML IV BOLUS
INTRAVENOUS | Status: DC | PRN
  Administered 2015-08-30: 110 mg via INTRAVENOUS

## 2015-08-30 MED ORDER — ADULT MULTIVITAMIN W/MINERALS CH: 1.0000 | ORAL_TABLET | Freq: Every day | ORAL | Status: DC

## 2015-08-30 MED ORDER — DILTIAZEM HCL ER COATED BEADS 120 MG PO CP24: 120.0000 mg | ORAL_CAPSULE | Freq: Every day | ORAL | Status: DC

## 2015-08-30 MED ORDER — LATANOPROST 0.005 % OP SOLN
1.0000 [drp] | Freq: Every day | OPHTHALMIC | Status: DC
  Administered 2015-08-30: 1 [drp] via OPHTHALMIC
  Filled 2015-08-30: qty 2.5

## 2015-08-30 MED ORDER — METOPROLOL TARTRATE 1 MG/ML IV SOLN: 2.0000 mg | INTRAVENOUS | Status: DC | PRN

## 2015-08-30 MED ORDER — ROCURONIUM BROMIDE 50 MG/5ML IV SOLN
INTRAVENOUS | Status: AC
  Filled 2015-08-30: qty 1

## 2015-08-30 MED ORDER — HYDROCODONE-ACETAMINOPHEN 7.5-325 MG PO TABS: 1.0000 | ORAL_TABLET | Freq: Once | ORAL | Status: DC | PRN

## 2015-08-30 MED ORDER — OXYCODONE HCL 5 MG PO TABS: 5.0000 mg | ORAL_TABLET | Freq: Four times a day (QID) | ORAL | Status: DC | PRN

## 2015-08-30 MED ORDER — 0.9 % SODIUM CHLORIDE (POUR BTL) OPTIME
TOPICAL | Status: DC | PRN
  Administered 2015-08-30: 2000 mL

## 2015-08-30 MED ORDER — BISACODYL 10 MG RE SUPP: 10.0000 mg | Freq: Every day | RECTAL | Status: DC | PRN

## 2015-08-30 MED ORDER — ACETAMINOPHEN 650 MG RE SUPP: 325.0000 mg | RECTAL | Status: DC | PRN

## 2015-08-30 MED ORDER — SUGAMMADEX SODIUM 200 MG/2ML IV SOLN
INTRAVENOUS | Status: DC | PRN
  Administered 2015-08-30: 200 mg via INTRAVENOUS

## 2015-08-30 MED ORDER — ENOXAPARIN SODIUM 30 MG/0.3ML ~~LOC~~ SOLN
30.0000 mg | SUBCUTANEOUS | Status: AC
  Administered 2015-08-30: 30 mg via SUBCUTANEOUS
  Filled 2015-08-30: qty 0.3

## 2015-08-30 MED ORDER — PHENOL 1.4 % MT LIQD: 1.0000 | OROMUCOSAL | Status: DC | PRN

## 2015-08-30 MED ORDER — LACTATED RINGERS IV SOLN
INTRAVENOUS | Status: DC | PRN
  Administered 2015-08-30 (×2): via INTRAVENOUS

## 2015-08-30 MED ORDER — CHLORHEXIDINE GLUCONATE CLOTH 2 % EX PADS: 6.0000 | MEDICATED_PAD | Freq: Every day | CUTANEOUS | Status: DC

## 2015-08-30 MED ORDER — MUPIROCIN 2 % EX OINT
1.0000 "application " | TOPICAL_OINTMENT | Freq: Once | CUTANEOUS | Status: DC
  Filled 2015-08-30: qty 22

## 2015-08-30 MED ORDER — MORPHINE SULFATE (PF) 2 MG/ML IV SOLN: 2.0000 mg | INTRAVENOUS | Status: DC | PRN

## 2015-08-30 MED ORDER — CILOSTAZOL 100 MG PO TABS
100.0000 mg | ORAL_TABLET | Freq: Two times a day (BID) | ORAL | Status: DC
  Administered 2015-08-30 (×2): 100 mg via ORAL
  Filled 2015-08-30 (×4): qty 1

## 2015-08-30 MED ORDER — DOCUSATE SODIUM 100 MG PO CAPS: 100.0000 mg | ORAL_CAPSULE | Freq: Every day | ORAL | Status: DC

## 2015-08-30 MED ORDER — PHENYLEPHRINE 40 MCG/ML (10ML) SYRINGE FOR IV PUSH (FOR BLOOD PRESSURE SUPPORT)
PREFILLED_SYRINGE | INTRAVENOUS | Status: AC
  Filled 2015-08-30: qty 10

## 2015-08-30 MED ORDER — PROTAMINE SULFATE 10 MG/ML IV SOLN
INTRAVENOUS | Status: DC | PRN
  Administered 2015-08-30: 10 mg via INTRAVENOUS

## 2015-08-30 MED ORDER — HYDRALAZINE HCL 20 MG/ML IJ SOLN: 5.0000 mg | INTRAMUSCULAR | Status: DC | PRN

## 2015-08-30 MED ORDER — BRIMONIDINE TARTRATE 0.15 % OP SOLN
1.0000 [drp] | Freq: Three times a day (TID) | OPHTHALMIC | Status: DC
  Administered 2015-08-30: 1 [drp] via OPHTHALMIC
  Filled 2015-08-30: qty 5

## 2015-08-30 MED ORDER — GUAIFENESIN-DM 100-10 MG/5ML PO SYRP
15.0000 mL | ORAL_SOLUTION | ORAL | Status: DC | PRN
Start: 2015-08-30 — End: 2015-08-31

## 2015-08-30 MED ORDER — FLUOXETINE HCL 20 MG PO CAPS: 20.0000 mg | ORAL_CAPSULE | Freq: Every day | ORAL | Status: DC

## 2015-08-30 MED ORDER — EPHEDRINE SULFATE 50 MG/ML IJ SOLN
INTRAMUSCULAR | Status: AC
  Filled 2015-08-30: qty 1

## 2015-08-30 MED ORDER — ENOXAPARIN SODIUM 30 MG/0.3ML ~~LOC~~ SOLN: 30.0000 mg | SUBCUTANEOUS | Status: DC

## 2015-08-30 SURGICAL SUPPLY — 57 items
BANDAGE ELASTIC 4 VELCRO ST LF (GAUZE/BANDAGES/DRESSINGS) IMPLANT
BANDAGE ESMARK 6X9 LF (GAUZE/BANDAGES/DRESSINGS) IMPLANT
BNDG CMPR 9X6 STRL LF SNTH (GAUZE/BANDAGES/DRESSINGS)
BNDG ESMARK 6X9 LF (GAUZE/BANDAGES/DRESSINGS)
CANISTER SUCTION 2500CC (MISCELLANEOUS) ×2 IMPLANT
CANNULA VESSEL 3MM 2 BLNT TIP (CANNULA) ×4 IMPLANT
CATH EMB 4FR 40CM (CATHETERS) ×1 IMPLANT
CLIP TI MEDIUM 24 (CLIP) ×2 IMPLANT
CLIP TI WIDE RED SMALL 24 (CLIP) ×2 IMPLANT
CUFF TOURNIQUET SINGLE 24IN (TOURNIQUET CUFF) IMPLANT
CUFF TOURNIQUET SINGLE 34IN LL (TOURNIQUET CUFF) IMPLANT
CUFF TOURNIQUET SINGLE 44IN (TOURNIQUET CUFF) IMPLANT
DRAIN CHANNEL 15F RND FF W/TCR (WOUND CARE) IMPLANT
DRAPE PROXIMA HALF (DRAPES) IMPLANT
DRAPE X-RAY CASS 24X20 (DRAPES) IMPLANT
DRSG COVADERM 4X10 (GAUZE/BANDAGES/DRESSINGS) IMPLANT
DRSG COVADERM 4X8 (GAUZE/BANDAGES/DRESSINGS) IMPLANT
ELECT REM PT RETURN 9FT ADLT (ELECTROSURGICAL) ×2
ELECTRODE REM PT RTRN 9FT ADLT (ELECTROSURGICAL) ×1 IMPLANT
EVACUATOR SILICONE 100CC (DRAIN) IMPLANT
GLOVE BIO SURGEON STRL SZ7.5 (GLOVE) ×2 IMPLANT
GLOVE BIOGEL PI IND STRL 6.5 (GLOVE) IMPLANT
GLOVE BIOGEL PI IND STRL 8 (GLOVE) ×1 IMPLANT
GLOVE BIOGEL PI INDICATOR 6.5 (GLOVE) ×3
GLOVE BIOGEL PI INDICATOR 8 (GLOVE) ×1
GLOVE ECLIPSE 6.5 STRL STRAW (GLOVE) ×3 IMPLANT
GOWN STRL REUS W/ TWL LRG LVL3 (GOWN DISPOSABLE) ×3 IMPLANT
GOWN STRL REUS W/TWL LRG LVL3 (GOWN DISPOSABLE) ×6
KIT BASIN OR (CUSTOM PROCEDURE TRAY) ×2 IMPLANT
KIT ROOM TURNOVER OR (KITS) ×2 IMPLANT
LIQUID BAND (GAUZE/BANDAGES/DRESSINGS) ×1 IMPLANT
MARKER GRAFT CORONARY BYPASS (MISCELLANEOUS) IMPLANT
NS IRRIG 1000ML POUR BTL (IV SOLUTION) ×4 IMPLANT
PACK PERIPHERAL VASCULAR (CUSTOM PROCEDURE TRAY) ×2 IMPLANT
PAD ARMBOARD 7.5X6 YLW CONV (MISCELLANEOUS) ×4 IMPLANT
PADDING CAST COTTON 6X4 STRL (CAST SUPPLIES) IMPLANT
SET COLLECT BLD 21X3/4 12 (NEEDLE) IMPLANT
SPONGE SURGIFOAM ABS GEL 100 (HEMOSTASIS) IMPLANT
STAPLER VISISTAT (STAPLE) IMPLANT
STOPCOCK 4 WAY LG BORE MALE ST (IV SETS) ×1 IMPLANT
SUT ETHILON 3 0 PS 1 (SUTURE) IMPLANT
SUT PROLENE 5 0 C 1 24 (SUTURE) ×2 IMPLANT
SUT PROLENE 6 0 BV (SUTURE) ×2 IMPLANT
SUT PROLENE 7 0 BV 1 (SUTURE) IMPLANT
SUT SILK 2 0 FS (SUTURE) ×2 IMPLANT
SUT SILK 2 0 SH (SUTURE) ×2 IMPLANT
SUT SILK 3 0 (SUTURE)
SUT SILK 3-0 18XBRD TIE 12 (SUTURE) IMPLANT
SUT VIC AB 2-0 CTB1 (SUTURE) ×4 IMPLANT
SUT VIC AB 3-0 SH 27 (SUTURE) ×4
SUT VIC AB 3-0 SH 27X BRD (SUTURE) ×2 IMPLANT
SUT VICRYL 4-0 PS2 18IN ABS (SUTURE) ×4 IMPLANT
SYR 3ML LL SCALE MARK (SYRINGE) ×1 IMPLANT
TRAY FOLEY W/METER SILVER 16FR (SET/KITS/TRAYS/PACK) ×2 IMPLANT
TUBING EXTENTION W/L.L. (IV SETS) IMPLANT
UNDERPAD 30X30 INCONTINENT (UNDERPADS AND DIAPERS) ×2 IMPLANT
WATER STERILE IRR 1000ML POUR (IV SOLUTION) ×2 IMPLANT

## 2015-08-30 NOTE — Telephone Encounter (Signed)
-----   Message from Mena Goes, RN sent at 08/30/2015 11:05 AM EST ----- Regarding: schedule   ----- Message -----    From: Gabriel Earing, PA-C    Sent: 08/30/2015  10:00 AM      To: [facility] Charge Pool  S/p revision and  left patch angioplasty left fem pop bypass 08/30/15.  F/u with Dr. [name] in 2 weeks.  Thanks,

## 2015-08-30 NOTE — Anesthesia Procedure Notes (Signed)
Procedure Name: Intubation Date/Time: 08/30/2015 7:36 AM Performed by: Salli Quarry Shakema Surita Pre-anesthesia Checklist: Patient identified, Emergency Drugs available, Suction available and Patient being monitored Patient Re-evaluated:Patient Re-evaluated prior to inductionOxygen Delivery Method: Circle system utilized Preoxygenation: Pre-oxygenation with 100% oxygen Intubation Type: IV induction Ventilation: Mask ventilation without difficulty and Oral airway inserted - appropriate to patient size Laryngoscope Size: Mac and 4 Grade View: Grade I Tube type: Oral Number of attempts: 1 Airway Equipment and Method: Stylet Placement Confirmation: positive ETCO2,  ETT inserted through vocal cords under direct vision and breath sounds checked- equal and bilateral Secured at: 23 cm Tube secured with: Tape Dental Injury: Teeth and Oropharynx as per pre-operative assessment

## 2015-08-30 NOTE — Progress Notes (Addendum)
  Day of Surgery Note    Subjective:  Requesting pain medicaiton  Filed Vitals:   08/30/15 1001 08/30/15 1027  BP: 127/63 134/64  Pulse:  68  Temp: 97.9 F (36.6 C)   Resp:  15    Incisions:   Left groin is soft without hematoma; left ankle incision is clean and intact Extremities:  Brisk left PT doppler signal; monophasic left DP   Assessment/Plan:  This is a 74 y.o. male who is s/p vein patch angioplasty of proximal left femoropopliteal bypass graft using left great saphenous vein  -pt doing well with brisk left PT doppler signal; there is a monophasic left DP signal present as well -continue pain control -to [place] when bed available -anticipate d/c in next 1-2 days -scheduled to get a dose of Lovenox at 6pm x 1 dose and then Eliquis scheduled to resume at [place], PA-C 08/30/2015 10:52 AM  Agree with above. Brisk doppler flow left foot.   Deitra Mayo, MD, [place] [phone] Office: [phone]

## 2015-08-30 NOTE — Progress Notes (Signed)
UR COMPLETED  

## 2015-08-30 NOTE — Evaluation (Signed)
Physical Therapy Evaluation Patient Details Name: Troy Jarrett [name] MRN: [id number] DOB: 1941/10/01 Today's Date: 08/30/2015   History of Present Illness  Pt is a 74 y/o M s/p revision of Lt fem-pop bypass.  Pt's PMH includes AAA, COPD, TIA, lung cancer, spinal stenosis, Lt fem-pop bypass.  Clinical Impression  Patient is s/p above surgery resulting in functional limitations due to the deficits listed below (see PT Problem List). Troy [name] will have 24/7 assist from his wife and daughter once he is d/c.  Dec stride length but ambulated 150 ft using RW in hallway today. Will determine any DME needs as pt progresses.  Patient will benefit from skilled PT to increase their independence and safety with mobility to allow discharge to the venue listed below.      Follow Up Recommendations No PT follow up    Equipment Recommendations  Other (comment) (TBD as pt progresses)    Recommendations for Other Services       Precautions / Restrictions Precautions Precautions: Fall      Mobility  Bed Mobility Overal bed mobility: Needs Assistance Bed Mobility: Supine to Sit     Supine to sit: Supervision     General bed mobility comments: slightly increased time w/ HOB elevated  Transfers Overall transfer level: Needs assistance Equipment used: Rolling walker (2 wheeled) Transfers: Sit to/from Stand Sit to Stand: Min assist         General transfer comment: Min A to steady RW during standing.  Cues for hand placement and technique and to bring RW w/ him as he goes to sit down.  Ambulation/Gait Ambulation/Gait assistance: Min guard Ambulation Distance (Feet): 150 Feet Assistive device: Rolling walker (2 wheeled) Gait Pattern/deviations: Step-through pattern;Decreased stride length;Shuffle;Antalgic   Gait velocity interpretation: Below normal speed for age/gender General Gait Details: Pt takes short steps as he says he is "being cautious".  Encouraged to increase stride length as  able.    Stairs            Wheelchair Mobility    Modified Rankin (Stroke Patients Only)       Balance Overall balance assessment: Needs assistance Sitting-balance support: Feet supported Sitting balance-Leahy Scale: Good     Standing balance support: Bilateral upper extremity supported;During functional activity Standing balance-Leahy Scale: Poor Standing balance comment: RW for support                             Pertinent Vitals/Pain Pain Assessment: 0-10 Pain Score: 2  Pain Location: Lt groin Pain Descriptors / Indicators: Aching;Tightness Pain Intervention(s): Limited activity within patient's tolerance;Monitored during session;Repositioned    Home Living Family/patient expects to be discharged to:: Private residence Living Arrangements: Spouse/significant other Available Help at Discharge: Family;Available 24 hours/day (wife and children) Type of Home: House Home Access: Stairs to enter Entrance Stairs-Rails: None Entrance Stairs-Number of Steps: 2 Home Layout: Two level;Able to live on main level with bedroom/bathroom Home Equipment: None      Prior Function Level of Independence: Independent               Hand Dominance        Extremity/Trunk Assessment   Upper Extremity Assessment: Overall WFL for tasks assessed           Lower Extremity Assessment: LLE deficits/detail   LLE Deficits / Details: limited ROM and strength as expected s/p surgery     Communication   Communication: No difficulties  Cognition Arousal/Alertness: Awake/alert  Behavior During Therapy: WFL for tasks assessed/performed Overall Cognitive Status: Within Functional Limits for tasks assessed                      General Comments      Exercises General Exercises - Lower Extremity Ankle Circles/Pumps: AROM;Both;10 reps;Seated Long Arc Quad: AROM;Both;10 reps;Seated      Assessment/Plan    PT Assessment Patient needs continued PT  services  PT Diagnosis Difficulty walking;Acute pain   PT Problem List Decreased strength;Decreased range of motion;Decreased activity tolerance;Decreased balance;Decreased mobility;Decreased knowledge of use of DME;Decreased safety awareness;Decreased knowledge of precautions;Pain  PT Treatment Interventions DME instruction;Gait training;Stair training;Functional mobility training;Therapeutic activities;Therapeutic exercise;Balance training;Neuromuscular re-education;Patient/family education   PT Goals (Current goals can be found in the Care Plan section) Acute Rehab PT Goals Patient Stated Goal: to go home PT Goal Formulation: With patient Time For Goal Achievement: 09/13/15 Potential to Achieve Goals: Good    Frequency Min 3X/week   Barriers to discharge Inaccessible home environment 2 steps to enter home w/o rail    Co-evaluation               End of Session Equipment Utilized During Treatment: Gait belt Activity Tolerance: Patient tolerated treatment well Patient left: in chair;with call bell/phone within reach Nurse Communication: Mobility status;Precautions         Time: 5397-6734 PT Time Calculation (min) (ACUTE ONLY): 26 min   Charges:   PT Evaluation $Initial PT Evaluation Tier I: 1 Procedure PT Treatments $Gait Training: 8-22 mins   PT G CodesJoslyn [name] PT, DPT [phone] Pager: [phone] 08/30/2015, 4:26 PM

## 2015-08-30 NOTE — Telephone Encounter (Signed)
Pts wife asked that we cb later on Thursday

## 2015-08-30 NOTE — Progress Notes (Signed)
Pulses /sites assessed by Zoila Shutter rn

## 2015-08-30 NOTE — Care Management Note (Addendum)
Case Management Note  Patient Details  Name: Troy Lupinacci [name] MRN: [id number] Date of Birth: 05/22/1941  Subjective/Objective:            Admitted s/p revision of Lt fem-pop bypass. PMH includes AAA, COPD, TIA, lung cancer, spinal stenosis, Lt fem-pop bypass.Independent with ADL's.       Action/Plan: Return to home when medically stable. CM to f/u with disposition needs.  Expected Discharge Date:  08/31/15               Expected Discharge Plan:  Home/Self Care  In-House Referral:     Discharge planning Services  CM Consult  Post Acute Care Choice:    Choice offered to:     DME Arranged:    DME Agency:     HH Arranged:    HH Agency:     Status of Service:  In process, will continue to follow  Medicare Important Message Given:    Date Medicare IM Given:    Medicare IM give by:    Date Additional Medicare IM Given:    Additional Medicare Important Message give by:     If discussed at [place] of Stay Meetings, dates discussed:    Additional Comments: PLAN:24/7 assist from his wife and daughter once he is d/c.   Avory Mimbs (Spouse) [phone]   Whitman Hero [place], [place] [phone] 08/30/2015, 7:49 PM

## 2015-08-30 NOTE — Transfer of Care (Signed)
Immediate Anesthesia Transfer of Care Note  Patient: Troy Kenner [name]  Procedure(s) Performed: Procedure(s): REVISION OF BYPASS GRAFT Left  FEMORAL-POPLITEAL ARTERY (Left) VEIN PATCH ANGIOPLASTY OF PROXIMAL Left BYPASS GRAFT (Left)  Patient Location: PACU  Anesthesia Type:General  Level of Consciousness: awake, oriented and patient cooperative  Airway & Oxygen Therapy: Patient Spontanous Breathing and Patient connected to nasal cannula oxygen  Post-op Assessment: Report given to RN, Post -op Vital signs reviewed and stable and Patient moving all extremities  Post vital signs: Reviewed and stable  Last Vitals:  Filed Vitals:   08/30/15 0614  BP: 138/66  Pulse: 62  Temp: 36.7 C  Resp: 16    Complications: No apparent anesthesia complications

## 2015-08-30 NOTE — Interval H&P Note (Signed)
History and Physical Interval Note:  08/30/2015 7:22 AM  Troy Kenner [name]  has presented today for surgery, with the diagnosis of Peripheral arterial disease I70.92  The various methods of treatment have been discussed with the patient and family. After consideration of risks, benefits and other options for treatment, the patient has consented to  Procedure(s): REVISION OF BYPASS GRAFT FEMORAL-POPLITEAL ARTERY (Left) VEIN PATCH ANGIOPLASTY OF PROXIMAL BYPASS GRAFT (Left) as a surgical intervention .  The patient's history has been reviewed, patient examined, no change in status, stable for surgery.  I have reviewed the patient's chart and labs.  Questions were answered to the patient's satisfaction.     Deitra Mayo

## 2015-08-30 NOTE — Anesthesia Postprocedure Evaluation (Signed)
Anesthesia Post Note  Patient: Troy Kenner [name]  Procedure(s) Performed: Procedure(s) (LRB): REVISION OF BYPASS GRAFT Left  FEMORAL-POPLITEAL ARTERY (Left) VEIN PATCH ANGIOPLASTY OF PROXIMAL Left BYPASS GRAFT (Left)  Patient location during evaluation: PACU Anesthesia Type: General Level of consciousness: awake and alert Pain management: pain level controlled Vital Signs Assessment: post-procedure vital signs reviewed and stable Respiratory status: spontaneous breathing and nonlabored ventilation Cardiovascular status: blood pressure returned to baseline Postop Assessment: no signs of nausea or vomiting Anesthetic complications: no    Last Vitals:  Filed Vitals:   08/30/15 1200 08/30/15 1231  BP:  128/64  Pulse: 68 72  Temp: 36.6 C 36.8 C  Resp: 8 7    Last Pain:  Filed Vitals:   08/30/15 1235  PainSc: 0-No pain                 Tiajuana Amass

## 2015-08-30 NOTE — Op Note (Signed)
NAME: Troy Nick [name]    MRN: [id number] DOB: 18-Mar-1941    DATE OF OPERATION: 08/30/2015  PREOP DIAGNOSIS: Vein graft stenosis left femoropopliteal bypass graft  POSTOP DIAGNOSIS: same  PROCEDURE: vein patch angioplasty of proximal left femoropopliteal bypass graft using left great saphenous vein  SURGEON: Judeth Cornfield. [name], MD, FACS  ASSIST: Leontine Locket, PA  ANESTHESIA: Gen.   EBL: minimal  INDICATIONS: Troy Filippi [name] is a 74 y.o. male who underwent a left femoropopliteal bypass graft in [place]. He subsequently required a revision of the distal graft. We have been following his graft with duplex and his most recent follow up study showed markedly elevated velocities in the proximal graft just beyond the anastomosis. I felt that the graft was at significant risk of thrombosis and recommended arteriography. This confirmed a tight focal stenosis in the proximal graft. I felt that it was close enough to the anastomosis that angioplasty would be associated with significant risk and recommended vein patch angioplasty.  FINDINGS: The stenosis was secondary to a plaque within the proximal graft. The patient has diffuse atherosclerotic disease. I elected not to endarterectomize this as I felt that the plaque was fairly smooth and endarterectomy would create a thrombogenic surface. I was also concerned that there might not be a good endpoint.  TECHNIQUE: The patient was taken to the operating room and received a general anesthetic. The entire left lower extremity and groin were prepped and draped in usual sterile fashion. A longitudinal incision was made in the left groin. Through dense scar tissue the common femoral artery, superficial femoral artery, deep femoral artery, and femoropopliteal bypass graft were dissected free. Multiple branches off of the common femoral artery were also controlled. Using the Doppler I could determine where the stenosis was in the vein graft which was just  beyond the anastomosis. I had identified a segment of great saphenous vein above the ankle area longitudinal incision was made over this area and the vein here was dissected free and ligated proximally and distally and opened longitudinally to be used as a vein patch. The patient was heparinized.  I had to clamp very high up on the external iliac artery for proximal control. The side branches were controlled with vessel loops and the proximal graft and deep femoral artery were controlled. A longitudinal arteriotomy was made in the proximal femoropopliteal bypass graft. This was extended distally into a normal segment of vein. It was then extended proximally onto the common femoral artery where there was diffuse plaquing but no focal narrowing. The narrowing within the proximal vein graft was a atherosclerotic plaque which was fairly smooth. I elected to patch this with the vein. This was sewn using continuous 6-0 Prolene suture. Prior to completing patch closure the arteries were backbled and flushed appropriately and the anastomosis completed. At the completion was an excellent Doppler signal throughout the proximal vein graft without elevated velocities. There was an excellent posterior tibial signal with the Doppler. Heparin was partially reversed with protamine.  The vein harvest site was closed with a deep layer of 3-0 Vicryl and the skin closed with 4-0 Vicryl. The groin incision was closed with 2 deep layers of 2-0 Vicryl, a subcutaneous layer of 3-0 Vicryl, and the skin was closed with a 4-0 subcutaneous stitch. Liquid band was applied. The patient tolerated the procedure well and was transferred to the recovery room in stable condition. All needle and sponge counts were correct.  Deitra Mayo, MD, FACS [facility] [place]  DATE OF DICTATION:   08/30/2015

## 2015-08-30 NOTE — Anesthesia Preprocedure Evaluation (Addendum)
Anesthesia Evaluation  Patient identified by MRN, date of birth, ID band Patient awake    Reviewed: Allergy & Precautions, NPO status , Patient's Chart, lab work & pertinent test results  History of Anesthesia Complications (+) PONV and history of anesthetic complications  Airway Mallampati: II  TM Distance: >3 FB Neck ROM: Full    Dental  (+) Dental Advisory Given   Pulmonary sleep apnea , COPD, former smoker,    breath sounds clear to auscultation       Cardiovascular hypertension, Pt. on medications + Peripheral Vascular Disease  + dysrhythmias Atrial Fibrillation  Rhythm:Regular Rate:Normal     Neuro/Psych TIACVA    GI/Hepatic Neg liver ROS, GERD  ,  Endo/Other  negative endocrine ROS  Renal/GU CRFRenal disease     Musculoskeletal  (+) Arthritis ,   Abdominal   Peds  Hematology  (+) anemia ,   Anesthesia Other Findings   Reproductive/Obstetrics                           Lab Results  Component Value Date   WBC 5.8 06/23/2015   HGB 11.9* 08/29/2015   HCT 35.0* 08/29/2015   MCV 85.5 06/23/2015   PLT 235 06/23/2015   Lab Results  Component Value Date   CREATININE 1.40* 08/29/2015   BUN 17 08/29/2015   NA 141 08/29/2015   K 3.9 08/29/2015   CL 106 08/29/2015   CO2 24 07/27/2015    Anesthesia Physical Anesthesia Plan  ASA: III  Anesthesia Plan: General   Post-op Pain Management:    Induction: Intravenous  Airway Management Planned: Oral ETT  Additional Equipment:   Intra-op Plan:   Post-operative Plan: Extubation in OR  Informed Consent: I have reviewed the patients History and Physical, chart, labs and discussed the procedure including the risks, benefits and alternatives for the proposed anesthesia with the patient or authorized representative who has indicated his/her understanding and acceptance.   Dental advisory given  Plan Discussed with:  CRNA  Anesthesia Plan Comments:         Anesthesia Quick Evaluation

## 2015-08-30 NOTE — H&P (View-Only) (Signed)
[facility] [place] HISTORY AND PHYSICAL   MRN : [id number]  History of Present Illness:   Troy Groft [name] is a 74 y.o. male who is s/p left femoropopliteal arterial bypass graft placed in 1994 with revision in 2001, and left CEA on 07/14/13 by Dr. [name].  He also has a small abdominal aortic aneurysm (3.4 cm in January 2014) that we have been monitoring.   He returns today for follow up.   Medical problems include hypertension, hyperlipidemia, glaucoma, and remote history of lung cancer.   After about 1/2 mile of walking he develops bilateral buttocks and calf pain, worse in the right, denies non healing wounds, no worse than his last visit.  Dr. Susa Day, ortho, determined that he has spinal stenosis, which he states has affected his walking and he has therefore decreased his walking significantly.  The patient had a preoperative TIA as manifested by left arm tremor and weakness, both legs giving way, and seeing purple spots in both eyes, he has no residual neurological deficits.   He had an exacerbation of atrial fib and a TIA in October 2016 as manifested by nausea, dizziness; he denies lateralizing symptoms, denies monocular loss of vision, denies speech difficulties, was evaluated at [facility]. Wife states he was discharged in atrial flutter, had a cardiac recorder implanted last week, sees [name].  Pt Diabetic: No  Pt smoker: former smoker, quit October, 2014  Pt meds include: Statin :Yes Betablocker: no ASA: No Other anticoagulants/antiplatelets: Eliquis for atrial fib prescribed by [name], Plavix stopped He also takes Pletal     Current Outpatient Prescriptions  Medication Sig Dispense Refill  . bimatoprost (LUMIGAN) 0.03 % ophthalmic solution Place 1 drop into the left eye at bedtime.     [name] BIOTIN PO Take 5,000 mcg by mouth daily.    . Bisacodyl (DULCOLAX PO) Take 1 capsule by mouth daily as needed (constipation).     . brimonidine  (ALPHAGAN P) 0.1 % SOLN Place 3 drops into the left eye 3 (three) times daily.     . cilostazol (PLETAL) 100 MG tablet Take 100 mg by mouth 2 (two) times daily.    [name] diltiazem (CARDIZEM CD) 120 MG 24 hr capsule Take 120 mg by mouth daily.    [name] diltiazem (CARDIZEM) 30 MG tablet Take 1 tablet by mouth every 4 hours AS NEEDED for HR >100 BP >100    . dorzolamide-timolol (COSOPT) 22.3-6.8 MG/ML ophthalmic solution Place 1 drop into the left eye 3 (three) times daily.     [name] doxazosin (CARDURA) 4 MG tablet Take 2 mg by mouth daily.    [name] dronedarone (MULTAQ) 400 MG tablet Take 400 mg by mouth 2 (two) times daily with a meal.    . ELIQUIS 5 MG TABS tablet TAKE 1 TABLET TWICE DAILY. (Patient taking differently: TAKE 5 MG BY MOUTH TWICE DAILY.) 180 tablet 3  . FLUoxetine (PROZAC) 20 MG capsule Take 20 mg by mouth daily.    . Ginkgo Biloba 40 MG TABS Take 40 mg by mouth daily.     . IRON PO Take 1 tablet by mouth daily.    [name] MELATONIN PO Take 3 mg by mouth at bedtime as needed (sleep).     . Multiple Vitamins-Minerals (CENTRUM SILVER PO) Take 1 tablet by mouth daily.    . ranitidine (ZANTAC) 150 MG tablet Take 150 mg by mouth daily as needed for heartburn.     . rosuvastatin (CRESTOR) 40 MG  tablet TAKE 1 TABLET ONCE DAILY. (Patient taking differently: TAKE 40 MG BY MOUTH ONCE DAILY.) 90 tablet 3   No current facility-administered medications for this visit.    Past Medical History  Diagnosis Date  . Hypertension   . Hyperlipidemia   . Glaucoma BOTH EYES    Dr Gershon Crane  . Hx of adenomatous colonic polyps 2005     X 2; 1 hyperplastic polyp; Dr Olevia Perches  . Impaired fasting glucose 2007    108; A1c5.4%  . History of lung cancer APRIL 2012  SQUAMOUS CELL---- S/P RIGHT LOWER LOBECTOMY AT [facility] --  NO CHEMORADIATION---  NO RECURRENCE     ONCOLOGIST- DR Tressie Stalker  LOV IN [facility] 10-27-2012  . GERD (gastroesophageal reflux disease)   . BPH (benign prostatic hypertrophy)   . Frequency of urination   . Urgency  of urination   . Nocturia   . Lesion of bladder   . Microhematuria   . Arthritis   . History of basal cell carcinoma excision   . Exertional dyspnea   . Peripheral vascular disease ([place]) S/P ANGIOPLASTY AND STENTING    FOLLOWED  BY DR [name]  . PAD (peripheral artery disease) ([place]) ABI'S  JAN 2014  0.65 ON RIGHT ;  1.04 ON LEFT  . AAA (abdominal aortic aneurysm) ([facility]) LAST ABD. [name]  JAN 2014  3.4CM    MONITORED BY DR [name]  . Bilateral carotid artery stenosis DUPLEX 12-29-2012  BY DR [facility]    BILATERAL ICA STENOSIS 60-79%  . COPD (chronic obstructive pulmonary disease) ([name])   . Stroke [facility]) Jul 07, 2013    mini  TIA  . Cancer ([place]) 01-25-12    LUNG  . Atrial fibrillation ([place])   . Spinal stenosis Sept. 2015    Social History Social History  Substance Use Topics  . Smoking status: Former Smoker -- 0.50 packs/day for 50 years    Types: Cigarettes    Quit date: 07/28/2013  . Smokeless tobacco: Never Used     Comment: pt states that he is now taking chantix  . Alcohol Use: Yes     Comment:  socially, variable    Family History Family History  Problem Relation Age of Onset  . Stroke Mother     mini strokes  . Alcohol abuse Father   . Heart disease Father     MI after 39  . Stroke Father   . Hypertension Father   . Heart attack Father   . Heart disease Paternal Aunt     several  . Hypertension Paternal Aunt     several  . Stroke Paternal Aunt     several  . Stroke Paternal Uncle     several  . Heart disease Paternal Uncle     several;2 had MI pre 42    Surgical History Past Surgical History  Procedure Laterality Date  . Angio plasty      X 4 in legs  . Colon polyectomy    . Trabecular surgery      OS  . Lung lobectomy  01/24/2011     RIGHT UPPER LOBE  (SQUAMOUS CELL CARCINOMA) Dr Dorthea Cove , [facility]. No chemotherapyor radiation  . Laryngoscopy  06-27-2004    BX VOCAL CORD  (LEUKOPLAKIA)  PER PT NO ISSUES SINCE  . Cataract extraction w/ intraocular lens   implant, bilateral    . Basal cell carcinoma excision      MULTIPLE TIMES--  RIGHT FOREARM, CHEEKS, AND BACK  . Femoral-popliteal  bypass graft Left 1994  [facility]    AND 2001  IN [place]  . Cardiovascular stress test  12-08-2012  DR [facility]    NORMAL LEXISCAN WITH NO EXERCISE NUCLEAR STUDY/ EF 66%/   NO ISCHEMIA/ NO SIGNIFICANT CHANGE FROM PRIOR STUDY  . Transthoracic echocardiogram  12-29-2012  DR [facility]    MILD LVH/  LVSF NORMAL/ EF 13-08%/  GRADE I DIASTOLIC DYSFUNCTION  . Aortogram  07-27-2002    MILD DIFFUSE ILIAC ARTERY OCCLUSIVE DISEASE /  LEFT RENAL ARTERY 20%/ PATENT LEFT FEM-POP GRAFT/ MILD SFA AND POPLITEAL ARTERY OCCLUSIVE DISEASE W/ SEVERE KIDNEY OCCLUSIVE DISEASE  . Cystoscopy w/ retrogrades Bilateral 01/21/2013    Procedure: CYSTOSCOPY WITH RETROGRADE PYELOGRAM;  Surgeon: Molli Hazard, MD;  [facility];  Service: Urology;  Laterality: Bilateral;   CYSTO, BLADDER BIOPSY, BILATERAL RETROGRADE PYELOGRAM  RAD TECH FROM RADIOLOGY PER JOY  . Cystoscopy with biopsy N/A 01/21/2013    Procedure: CYSTOSCOPY WITH BIOPSY;  Surgeon: Molli Hazard, MD;  Location: [facility];  Service: Urology;  Laterality: N/A;  . Endarterectomy Right 07/14/2013    Procedure: ENDARTERECTOMY CAROTID;  Surgeon: Angelia Mould, MD;  Location: [place];  Service: Vascular;  Laterality: Right;  . Patch angioplasty Right 07/14/2013    Procedure: PATCH ANGIOPLASTY;  Surgeon: Angelia Mould, MD;  Location: [facility] OR;  Service: Vascular;  Laterality: Right;  . Carotid endarterectomy Right 07-14-13    cea  . Carotid angiogram N/A 07/10/2013    Procedure: CAROTID ANGIOGRAM;  Surgeon: Elam Dutch, MD;  Location: [facility] CATH LAB;  Service: Cardiovascular;  Laterality: N/A;  . Ep implantable device N/A 08/12/2015    Procedure: Loop Recorder Insertion;  Surgeon: Thompson Grayer, MD;  Location: [place] CV LAB;  Service: Cardiovascular;  Laterality: N/A;     Allergies  Allergen Reactions  . Penicillins Hives, Shortness Of Breath and Other (See Comments)    Flushing  & dyspnea Because of a history of documented adverse serious drug reaction;Medi Alert bracelet  is recommended PCN reaction causing immediate rash, facial/tongue/throat swelling, SOB or lightheadedness with hypotension: Yes PCN reaction causing severe rash involving mucus membranes or skin necrosis: No PCN reaction occurring within the last 10 years: NO PCN reaction that required hospitalization: NO  . Sulfonamide Derivatives Shortness Of Breath and Other (See Comments)    Flushing & dyspnea Because of a history of documented adverse serious drug reaction;Medi Alert bracelet  is recommended  . Niacin-Lovastatin Er Other (See Comments)    Dyspnea, flushing  . Atorvastatin Other (See Comments)    Myalgias & athralgias- takes Crestor  . Sulfa Antibiotics Hives    Current Outpatient Prescriptions  Medication Sig Dispense Refill  . bimatoprost (LUMIGAN) 0.03 % ophthalmic solution Place 1 drop into the left eye at bedtime.     [name] BIOTIN PO Take 5,000 mcg by mouth daily.    . Bisacodyl (DULCOLAX PO) Take 1 capsule by mouth daily as needed (constipation).     . brimonidine (ALPHAGAN P) 0.1 % SOLN Place 3 drops into the left eye 3 (three) times daily.     . cilostazol (PLETAL) 100 MG tablet Take 100 mg by mouth 2 (two) times daily.    [name] diltiazem (CARDIZEM CD) 120 MG 24 hr capsule Take 120 mg by mouth daily.    [name] diltiazem (CARDIZEM) 30 MG tablet Take 1 tablet by mouth every 4 hours AS NEEDED for HR >100 BP >100    .  dorzolamide-timolol (COSOPT) 22.3-6.8 MG/ML ophthalmic solution Place 1 drop into the left eye 3 (three) times daily.     [name] doxazosin (CARDURA) 4 MG tablet Take 2 mg by mouth daily.    [name] dronedarone (MULTAQ) 400 MG tablet Take 400 mg by mouth 2 (two) times daily with a meal.    . ELIQUIS 5 MG TABS tablet TAKE 1 TABLET TWICE DAILY. (Patient taking differently: TAKE 5 MG  BY MOUTH TWICE DAILY.) 180 tablet 3  . FLUoxetine (PROZAC) 20 MG capsule Take 20 mg by mouth daily.    . Ginkgo Biloba 40 MG TABS Take 40 mg by mouth daily.     . IRON PO Take 1 tablet by mouth daily.    [name] MELATONIN PO Take 3 mg by mouth at bedtime as needed (sleep).     . Multiple Vitamins-Minerals (CENTRUM SILVER PO) Take 1 tablet by mouth daily.    . ranitidine (ZANTAC) 150 MG tablet Take 150 mg by mouth daily as needed for heartburn.     . rosuvastatin (CRESTOR) 40 MG tablet TAKE 1 TABLET ONCE DAILY. (Patient taking differently: TAKE 40 MG BY MOUTH ONCE DAILY.) 90 tablet 3   No current facility-administered medications for this visit.     REVIEW OF SYSTEMS: See HPI for pertinent positives and negatives.  Physical Examination Filed Vitals:   08/17/15 1201  BP: 139/71  Pulse: 71  Temp: 97.2 F (36.2 C)  TempSrc: Oral  Resp: 16  Height: '6\' 1"'$  (1.854 m)  Weight: 201 lb (91.173 kg)  SpO2: 97%   Body mass index is 26.52 kg/(m^2).  General: WDWN in NAD  Gait: Normal  HENT: WNL  Eyes: Pupils equal  Pulmonary: normal non-labored breathing , without Rales, rhonchi, wheezing  Cardiac: RRR, no murmur detected  Abdomen: soft, NT, no masses palpated. Skin: no rashes, no ulcers, no cellulitis.  VASCULAR EXAM  Carotid Bruits  Left  Right    Negative  Negative    VASCULAR EXAM:  Extremities without ischemic changes  without Gangrene; without open wounds.   LE Pulses  LEFT  RIGHT   FEMORAL  palpable  palpable   POPLITEAL  not palpable  not palpable   POSTERIOR TIBIAL  not palpable  not palpable   DORSALIS PEDIS  ANTERIOR TIBIAL  not palpable  not palpable    Musculoskeletal: no muscle wasting or atrophy; no peripheral edema.  Neurologic: A&O X 3; Appropriate Affect ;  SENSATION: normal;  MOTOR FUNCTION: 5/5 Symmetric, CN 2-12 intact except for uvula deviation slightly to the left  Speech is fluent/normal              Non-Invasive Vascular Imaging (08/10/15):  CEREBROVASCULAR DUPLEX EVALUATION    INDICATION: Follow-up carotid disease     PREVIOUS INTERVENTION(S): Right carotid endarterectomy 07/14/2013    DUPLEX EXAM:     RIGHT  LEFT  Peak Systolic Velocities (cm/s) End Diastolic Velocities (cm/s) Plaque LOCATION Peak Systolic Velocities (cm/s) End Diastolic Velocities (cm/s) Plaque  138 18  CCA PROXIMAL 169 26   84 17  CCA MID 140 24   93 17  CCA DISTAL 110 20 HT  329 22 HT ECA 223 22 HT  80 21  ICA PROXIMAL 134 33 HT  108 31  ICA MID 93 27   105 30  ICA DISTAL 82 25     NA ICA / CCA Ratio (PSV) 0.96  Antegrade  Vertebral Flow Antegrade    Brachial Systolic Pressure (mmHg)   Within  normal limits  Subclavian Artery Waveforms Within normal limits     Plaque Morphology:  HM = Homogeneous, HT = Heterogeneous, CP = Calcific Plaque, SP = Smooth Plaque, IP = Irregular Plaque     ADDITIONAL FINDINGS:     IMPRESSION: 1. Widely patent right carotid endarterectomy without evidence of restenosis or hyperplasia.  2. Evidence of <40% left internal carotid artery stenosis. 3. >50% stenosis of the right external carotid artery. 4. Bilateral vertebral artery is antegrade.    Compared to the previous exam:  No significant change compared to prior exam.      LOWER EXTREMITY ARTERIAL DUPLEX EVALUATION    INDICATION: Follow-up left lower extremity bypass graft     PREVIOUS INTERVENTION(S): Left femoropopliteal arterial bypass graft placed in 1994 with revision in 2001    DUPLEX EXAM:     RIGHT  LEFT   Peak Systolic Velocity (cm/s) Ratio (if abnormal) Waveform  Peak Systolic Velocity (cm/s) Ratio (if abnormal) Waveform     Inflow Artery 107  B     Proximal Anastomosis 686 6.4 Stenotic     Proximal Graft 359  Turbulent     Mid Graft 44  M      Distal Graft 38  M     Distal Anastomosis 34  M     Outflow Artery 41  M  0.58 Today's ABI / TBI 0.79  0.60 Previous ABI / TBI (12/28/2014 ) 0.88     Waveform:    M - Monophasic       B - Biphasic       T - Triphasic  If Ankle Brachial Index (ABI) or Toe Brachial Index (TBI) performed, please see complete report  ADDITIONAL FINDINGS:     IMPRESSION: Patent left lower extremity bypass graft with significant (>70%) stenosis of the proximal anastomosis.    Compared to the previous exam:  No previous exam at this facility for comparison.       ASSESSMENT:  Jourdon Zimmerle [name] is a 74 y.o. male who is s/p left femoropopliteal arterial bypass graft placed in 1994 with revision in 2001, and left CEA on 07/14/13 by Dr. [name].  He also has a small abdominal aortic aneurysm that we have been monitoring (3.4 cm in January 2014). He has non life limiting bilateral hip and calf pain, relieved by rest. He has no ischemia in his feet/legs. He had a pre CEA TIA, then last month he had another TIA with an exacerbation of his atrial fib for which he was evaluated.  He takes Eliquis. 08/10/15 carotid duplex suggests a widely patent right carotid endarterectomy without evidence of restenosis or hyperplasia.  Evidence of <40% left internal carotid artery stenosis. No significant change compared to prior exam.    08/10/15 left LE arterial duplex suggests a patent left lower extremity bypass graft with significant (>70%) stenosis of the proximal anastomosis (686 cm/s).    AAA duplex is due about March 2017. Dr. [name] spoke with pt and wife.   Face to face time with patient was 25 minutes. Over 50% of this time was spent on counseling and coordination of care.   PLAN:   Based on today's exam and non-invasive vascular lab results, the patient will be scheduled for arteriogram by Dr. [name] on 08/29/15, with bilateral run off, possible intervention, access right femoral artery. I discussed in depth with the patient the nature of atherosclerosis, and emphasized the importance of maximal medical management including strict control of blood pressure, blood  glucose, and lipid levels, obtaining regular exercise, and cessation of smoking.  The patient is aware that without maximal medical management the underlying atherosclerotic disease process will progress, limiting the benefit of any interventions. Consideration for repair of AAA would be made when the size approaches 4.8 or 5.0 cm, growth > 1 cm/yr, and symptomatic status. The patient was given information about stroke prevention and what symptoms should prompt the patient to seek immediate medical care. The patient was given information about AAA including signs, symptoms, treatment,  what symptoms should prompt the patient to seek immediate medical care, and how to minimize the risk of enlargement and rupture of aneurysms. The patient was given information about PAD including signs, symptoms, treatment, what symptoms should prompt the patient to seek immediate medical care, and risk reduction measures to take. Thank you for allowing [name] to participate in this patient's care.  Clemon Chambers, RN, MSN, FNP-C [facility] Office: [phone]  Clinic MD: [name]  08/17/2015 12:12 PM

## 2015-08-31 ENCOUNTER — Encounter (HOSPITAL_COMMUNITY): Payer: Self-pay | Admitting: Vascular Surgery

## 2015-08-31 ENCOUNTER — Telehealth: Payer: Self-pay | Admitting: Internal Medicine

## 2015-08-31 LAB — BASIC METABOLIC PANEL
ANION GAP: 8 (ref 5–15)
BUN: 9 mg/dL (ref 6–20)
CALCIUM: 8.6 mg/dL — AB (ref 8.9–10.3)
CO2: 23 mmol/L (ref 22–32)
Chloride: 103 mmol/L (ref 101–111)
Creatinine, Ser: 0.94 mg/dL (ref 0.61–1.24)
Glucose, Bld: 148 mg/dL — ABNORMAL HIGH (ref 65–99)
Potassium: 4.9 mmol/L (ref 3.5–5.1)
Sodium: 134 mmol/L — ABNORMAL LOW (ref 135–145)

## 2015-08-31 LAB — CBC
HCT: 32.9 % — ABNORMAL LOW (ref 39.0–52.0)
HEMOGLOBIN: 11 g/dL — AB (ref 13.0–17.0)
MCH: 28.9 pg (ref 26.0–34.0)
MCHC: 33.4 g/dL (ref 30.0–36.0)
MCV: 86.6 fL (ref 78.0–100.0)
Platelets: 213 10*3/uL (ref 150–400)
RBC: 3.8 MIL/uL — AB (ref 4.22–5.81)
RDW: 14.7 % (ref 11.5–15.5)
WBC: 8.4 10*3/uL (ref 4.0–10.5)

## 2015-08-31 NOTE — Telephone Encounter (Signed)
New Message    Pt's wife calling stating that the pt had a "sleep test" done on 08/24/15 and they are wanting the results. Please call back and advise.

## 2015-08-31 NOTE — Evaluation (Signed)
Occupational Therapy Evaluation Patient Details Name: Troy Viviano [name] MRN: [id number] DOB: 13-Jun-1941 Today's Date: 08/31/2015    History of Present Illness Pt is a 74 y/o M s/p revision of Lt fem-pop bypass.  Pt's PMH includes AAA, COPD, TIA, lung cancer, spinal stenosis, Lt fem-pop bypass.   Clinical Impression   Patient admitted with above. Patient inidependent PTA. Patient currently functioning at an overall mod I level (requiring slightly increased time).  No additional OT needs identified, D/C from acute OT services and no additional follow-up OT needs at this time. All appropriate education provided to patient. Please re-order OT if needed.      Follow Up Recommendations  No OT follow up;Supervision - Intermittent    Equipment Recommendations  None recommended by OT    Recommendations for Other Services  None at this time   Precautions / Restrictions Precautions Precautions: Fall Restrictions Weight Bearing Restrictions: No    Mobility Bed Mobility Overal bed mobility: Modified Independent General bed mobility comments: slightly increased time  Transfers Overall transfer level: Modified independent Equipment used: None Transfers: Sit to/from Stand Sit to Stand: Supervision General transfer comment: slightly increased time    Balance Overall balance assessment: Needs assistance Sitting-balance support: No upper extremity supported;Feet supported Sitting balance-Leahy Scale: Good     Standing balance support: No upper extremity supported;During functional activity Standing balance-Leahy Scale: Good    ADL Overall ADL's : Modified independent;At baseline     Pertinent Vitals/Pain Pain Assessment: No/denies pain Faces Pain Scale: Hurts a little bit Pain Location: Lt groin Pain Descriptors / Indicators: Grimacing;Aching Pain Intervention(s): Monitored during session;Limited activity within patient's tolerance;Repositioned     Hand Dominance Right    Extremity/Trunk Assessment Upper Extremity Assessment Upper Extremity Assessment: Overall WFL for tasks assessed   Lower Extremity Assessment Lower Extremity Assessment: Defer to PT evaluation   Cervical / Trunk Assessment Cervical / Trunk Assessment: Normal   Communication Communication Communication: No difficulties   Cognition Arousal/Alertness: Awake/alert Behavior During Therapy: WFL for tasks assessed/performed Overall Cognitive Status: Within Functional Limits for tasks assessed              Home Living Family/patient expects to be discharged to:: Private residence Living Arrangements: Spouse/significant other Available Help at Discharge: Family;Available 24 hours/day (wife and children) Type of Home: House Home Access: Stairs to enter [employer] of Steps: 2 Entrance Stairs-Rails: None Home Layout: Two level;Able to live on main level with bedroom/bathroom     Bathroom Shower/Tub: Walk-in shower;Door   [employer] Toilet: Standard     Home Equipment: None   Additional Comments: Used to have a BSC ~5 years ago, but he's not sure where it is.   At this time do not feel that patient needs a BSC. Pt aware that he can purchase one at a medical store if needed post acute d/c.     Prior Functioning/Environment Level of Independence: Independent      OT Diagnosis: Generalized weakness   OT Problem List:  n/a, no acute OT needs identified    OT Treatment/Interventions:   n/a, no acute OT needs identified    OT Goals(Current goals can be found in the care plan section) Acute Rehab OT Goals Patient Stated Goal: to go home today OT Goal Formulation: All assessment and education complete, DC therapy  OT Frequency:  n/a, no acute OT needs identified    Barriers to D/C:  None known at this time    End of Session Activity Tolerance: Patient tolerated treatment well  Patient left: in chair;with call bell/phone within reach;with family/visitor present (bed  side chair, pt independent/mod I in room)   Time: 1041-1050 OT Time Calculation (min): 9 min Charges:  OT General Charges $OT Visit: 1 Procedure OT Evaluation $Initial OT Evaluation Tier I: 1 Procedure  Pierra Skora , MS, OTR/L, CLT Pager: [phone]  08/31/2015, 11:02 AM

## 2015-08-31 NOTE — Progress Notes (Signed)
   VASCULAR SURGERY ASSESSMENT & PLAN:  * 1 Day Post-Op s/p: VPA proximal right fempop bypass graft.  * Home today.   SUBJECTIVE: No complaints  PHYSICAL EXAM: Filed Vitals:   08/30/15 1934 08/30/15 2310 08/30/15 2315 08/31/15 0500  BP: 143/68  132/61 142/60  Pulse: 70  68 69  Temp:  97.7 F (36.5 C)  98.2 F (36.8 C)  TempSrc:  Oral  Oral  Resp: '12  13 17  '$ Height:      Weight:      SpO2: 97%  96% 97%   Brisk doppler flow in left foot. Incision looks fine.   LABS: Lab Results  Component Value Date   WBC 6.1 08/30/2015   HGB 10.7* 08/30/2015   HCT 33.6* 08/30/2015   MCV 86.4 08/30/2015   PLT 206 08/30/2015   Lab Results  Component Value Date   CREATININE 0.94 08/31/2015   Lab Results  Component Value Date   INR 1.08 08/30/2015   CBG (last 3)   Recent Labs  08/29/15 0845  GLUCAP 97    Active Problems:   PAD (peripheral artery disease) ([place])    Gae Gallop Beeper: [phone] 08/31/2015

## 2015-08-31 NOTE — Progress Notes (Signed)
Physical Therapy Treatment and Discharge Patient Details Name: Troy Huezo [name] MRN: [id number] DOB: 06-Aug-1941 Today's Date: 08/31/2015    History of Present Illness Pt is a 74 y/o M s/p revision of Lt fem-pop bypass.  Pt's PMH includes AAA, COPD, TIA, lung cancer, spinal stenosis, Lt fem-pop bypass.    PT Comments    Troy [name] made excellent progress and was able to ambulate in hallway and perform stair training w/ supervision w/o AD.  No further skilled PT needs identified.  Pt is signing off.   Follow Up Recommendations  No PT follow up     Equipment Recommendations  None recommended by PT    Recommendations for Other Services       Precautions / Restrictions Precautions Precautions: Fall Restrictions Weight Bearing Restrictions: No    Mobility  Bed Mobility Overal bed mobility: Modified Independent             General bed mobility comments: slightly increased time  Transfers Overall transfer level: Needs assistance Equipment used: None Transfers: Sit to/from Stand Sit to Stand: Supervision         General transfer comment: Supervision for pt's safety.  No instability noted.  Ambulation/Gait Ambulation/Gait assistance: Supervision Ambulation Distance (Feet): 250 Feet Assistive device: None Gait Pattern/deviations: Step-through pattern;Antalgic;Decreased stride length   Gait velocity interpretation: Below normal speed for age/gender General Gait Details: No physical assist or cues needed.  Dec stride length   Stairs Stairs: Yes Stairs assistance: Supervision Stair Management: One rail Right;Step to pattern;Alternating pattern;Forwards Number of Stairs: 4 (x2) General stair comments: Superivison for safety.  Cues for proper sequencing.  Instructed pt and pt's wife to ensure that pt is holding someone's hand when attempting to get inside home once d/c, both verbalized understanding.  Pt has 2 steps w/o rails to enter home.  Wheelchair Mobility     Modified Rankin (Stroke Patients Only)       Balance Overall balance assessment: Needs assistance Sitting-balance support: No upper extremity supported;Feet supported Sitting balance-Leahy Scale: Good     Standing balance support: During functional activity Standing balance-Leahy Scale: Fair                      Cognition Arousal/Alertness: Awake/alert Behavior During Therapy: WFL for tasks assessed/performed Overall Cognitive Status: Within Functional Limits for tasks assessed                      Exercises General Exercises - Lower Extremity Ankle Circles/Pumps: AROM;Both;10 reps;Seated Long Arc Quad: AROM;Both;10 reps;Seated    General Comments        Pertinent Vitals/Pain Pain Assessment: Faces Faces Pain Scale: Hurts a little bit Pain Location: Lt groin Pain Descriptors / Indicators: Grimacing;Aching Pain Intervention(s): Monitored during session;Limited activity within patient's tolerance;Repositioned    Home Living                      Prior Function            PT Goals (current goals can now be found in the care plan section) Acute Rehab PT Goals Patient Stated Goal: to go home today PT Goal Formulation: All assessment and education complete, DC therapy Progress towards PT goals: Goals met/education completed, patient discharged from PT    Frequency       PT Plan Equipment recommendations need to be updated    Co-evaluation             End of  Session Equipment Utilized During Treatment: Gait belt Activity Tolerance: Patient tolerated treatment well Patient left: with call bell/phone within reach;in bed;with family/visitor present     Time: 0920-0937 PT Time Calculation (min) (ACUTE ONLY): 17 min  Charges:  $Gait Training: 8-22 mins                    G Codes:      Joslyn Hy PT, [place] [phone] Pager: [phone] 08/31/2015, 10:39 AM

## 2015-08-31 NOTE — Discharge Summary (Signed)
Discharge Summary     Troy [name]. 23-Nov-1940 74 y.o. male  [id number]  Admission Date: 08/30/2015  Discharge Date: 08/31/15  Physician: Angelia Mould, MD  Admission Diagnosis: Peripheral arterial disease I70.92   HPI:   This is a 74 y.o. male who is s/p left femoropopliteal arterial bypass graft placed in 1994 with revision in 2001, and left CEA on 07/14/13 by Dr. [name].  He also has a small abdominal aortic aneurysm (3.4 cm in January 2014) that we have been monitoring.  He returns today for follow up.   Medical problems include hypertension, hyperlipidemia, glaucoma, and remote history of lung cancer.  After about 1/2 mile of walking he develops bilateral buttocks and calf pain, worse in the right, denies non healing wounds, no worse than his last visit.  Dr. Susa Day, ortho, determined that he has spinal stenosis, which he states has affected his walking and he has therefore decreased his walking significantly.  The patient had a preoperative TIA as manifested by left arm tremor and weakness, both legs giving way, and seeing purple spots in both eyes, he has no residual neurological deficits.   He had an exacerbation of atrial fib and a TIA in October 2016 as manifested by nausea, dizziness; he denies lateralizing symptoms, denies monocular loss of vision, denies speech difficulties, was evaluated at [facility]. Wife states he was discharged in atrial flutter, had a cardiac recorder implanted last week, sees [name].  Hospital Course:  The patient was admitted to the hospital and taken to the operating room on 08/30/2015 and underwent: vein patch angioplasty of proximal left femoropopliteal bypass graft using left great saphenous vein    The pt tolerated the procedure well and was transported to the PACU in good condition.   Later that evening, he did receive a dose of Lovenox at 6pm and his Eliquis is restarted on POD 1.    By POD 1, he does  have brisk doppler signals in his left foot and incision is clean and dry.   He is discharged home on POD 1.    The remainder of the hospital course consisted of increasing mobilization and increasing intake of solids without difficulty.  CBC    Component Value Date/Time   WBC 6.1 08/30/2015 1505   RBC 3.89* 08/30/2015 1505   HGB 10.7* 08/30/2015 1505   HCT 33.6* 08/30/2015 1505   PLT 206 08/30/2015 1505   MCV 86.4 08/30/2015 1505   MCH 27.5 08/30/2015 1505   MCHC 31.8 08/30/2015 1505   RDW 14.9 08/30/2015 1505   LYMPHSABS 1.2 02/08/2015 0842   MONOABS 0.6 02/08/2015 0842   EOSABS 0.2 02/08/2015 0842   BASOSABS 0.0 02/08/2015 0842    BMET    Component Value Date/Time   NA 134* 08/31/2015 0500   K 4.9 08/31/2015 0500   CL 103 08/31/2015 0500   CO2 23 08/31/2015 0500   GLUCOSE 148* 08/31/2015 0500   BUN 9 08/31/2015 0500   CREATININE 0.94 08/31/2015 0500   CREATININE 1.23* 07/27/2015 0936   CALCIUM 8.6* 08/31/2015 0500   GFRNONAA >60 08/31/2015 0500   GFRAA >60 08/31/2015 0500     Discharge Instructions    Call MD for:  redness, tenderness, or signs of infection (pain, swelling, bleeding, redness, odor or green/yellow discharge around incision site)    Complete by:  As directed      Call MD for:  severe or increased pain, loss or decreased feeling  in affected limb(s)  Complete by:  As directed      Call MD for:  temperature >100.5    Complete by:  As directed      Discharge wound care:    Complete by:  As directed   Wash the groin wound with soap and water daily and pat dry. (No tub bath-only shower)  Then put a dry gauze or washcloth there to keep this area dry daily and as needed.  Do not use Vaseline or neosporin on your incisions.  Only use soap and water on your incisions and then protect and keep dry.     Driving Restrictions    Complete by:  As directed   No driving for 2 weeks     Lifting restrictions    Complete by:  As directed   No lifting for 4  weeks     Resume previous diet    Complete by:  As directed            Discharge Diagnosis:  Peripheral arterial disease I70.92  Secondary Diagnosis: Patient Active Problem List   Diagnosis Date Noted  . Atherosclerotic peripheral vascular disease ([place]) 08/29/2015  . OSA (obstructive sleep apnea) 08/29/2015  . PAF (paroxysmal atrial fibrillation) ([place])   . Glaucoma 06/22/2015  . Dyspnea 02/04/2015  . Memory loss, short term 08/03/2014  . Aftercare following surgery of the circulatory system 07/28/2014  . Pain of right lower leg- and Left Thigh and leg 07/28/2014  . Atrial fibrillation ([place]) 02/14/2014  . Anemia, unspecified 12/28/2013  . Glaucoma 12/28/2013  . Carotid stenosis 07/24/2013  . Occlusion and stenosis of carotid artery without mention of cerebral infarction 07/09/2013  . Smoker 07/09/2013  . COPD with emphysema ([place]) 07/09/2013  . Chronic renal disease, stage 3, moderately decreased glomerular filtration rate between 30-59 mL/min/1.73 square meter 07/06/2013  . TIA (transient ischemic attack) 07/06/2013  . Chest pain 12/01/2012  . Fatigue 12/01/2012  . Right carotid bruit 12/01/2012  . Abdominal aneurysm without mention of rupture 10/22/2012  . Compulsive tobacco user syndrome 10/16/2012  . Basal cell carcinoma of skin 10/10/2012  . History of lung cancer 03/11/2012  . Lung nodule, multiple 03/11/2012  . PAD (peripheral artery disease) ([place]) 12/16/2011  . At risk for coronary artery disease 12/16/2011  . AAA (abdominal aortic aneurysm) ([place]) 09/27/2011  . Squamous cell lung cancer ([place]) 11/08/2010  . OTHER NONSPECIFIC ABNORMAL FINDING OF LUNG FIELD 10/31/2010  . GAIT IMBALANCE 03/02/2009  . FASTING HYPERGLYCEMIA 03/02/2009  . COLONIC POLYPS, HX OF 03/02/2009  . Tobacco use disorder 01/15/2008  . OBSTRUCTIVE CHRONIC BRONCHITIS WITHOUT EXACERBAT 01/15/2008  . HYPERPLASIA PROSTATE UNS W/O UR OBST & OTH LUTS 01/15/2008  . HYPERLIPIDEMIA 09/29/2007  .  Peripheral vascular disease ([place]) 09/29/2007  . LEUKOPLAKIA, VOCAL CORDS 09/29/2007  . FEMORAL BRUIT 09/29/2007  . GLAUCOMA NOS 10/30/2006   Past Medical History  Diagnosis Date  . Hypertension   . Hyperlipidemia   . Glaucoma BOTH EYES    Dr Gershon Crane  . Hx of adenomatous colonic polyps 2005     X 2; 1 hyperplastic polyp; Dr Olevia Perches  . Impaired fasting glucose 2007    108; A1c5.4%  . History of lung cancer APRIL 2012  SQUAMOUS CELL---- S/P RIGHT LOWER LOBECTOMY AT [facility] --  NO CHEMORADIATION---  NO RECURRENCE     ONCOLOGIST- DR Tressie Stalker  LOV IN [facility] 10-27-2012  . GERD (gastroesophageal reflux disease)   . BPH (benign prostatic hypertrophy)   . Frequency of urination   .  Urgency of urination   . Nocturia   . Lesion of bladder   . Microhematuria   . Arthritis   . History of basal cell carcinoma excision   . Exertional dyspnea   . Peripheral vascular disease ([place]) S/P ANGIOPLASTY AND STENTING    FOLLOWED  BY DR [name]  . PAD (peripheral artery disease) ([place]) ABI'S  JAN 2014  0.65 ON RIGHT ;  1.04 ON LEFT  . AAA (abdominal aortic aneurysm) ([facility]) LAST ABD. [name]  JAN 2014  3.4CM    MONITORED BY DR [name]  . Bilateral carotid artery stenosis DUPLEX 12-29-2012  BY DR [facility]    BILATERAL ICA STENOSIS 60-79%  . COPD (chronic obstructive pulmonary disease) ([place])   . Stroke [facility]) Jul 07, 2013    mini  TIA  . Cancer ([place]) 01-25-12    LUNG  . Atrial fibrillation ([place])   . Spinal stenosis Sept. 2015  . OSA (obstructive sleep apnea) 08/29/2015    Mild with AHI overall 6/hr but severe during REM sleep with AHI 30/hr with oxygen saturations < 88% for 5.5 minutes of total sleep time.    . Complication of anesthesia     had nausea after carotid artery surgery, states the medicine given to help the nausea, made it worse       Medication List    TAKE these medications        ALEVE 220 MG tablet  Generic drug:  naproxen sodium  Take 220 mg by mouth daily as needed (pain).     bimatoprost  0.01 % Soln  Commonly known as:  LUMIGAN  Place 1 drop into the left eye at bedtime.     Biotin 5000 MCG Tabs  Take 5,000 mcg by mouth daily.     bisacodyl 5 MG EC tablet  Commonly known as:  DULCOLAX  Take 5 mg by mouth daily as needed for moderate constipation.     brimonidine 0.1 % Soln  Commonly known as:  ALPHAGAN P  Place 3 drops into the left eye 3 (three) times daily.     CARDIZEM CD 120 MG 24 hr capsule  Generic drug:  diltiazem  Take 120 mg by mouth daily.     cilostazol 100 MG tablet  Commonly known as:  PLETAL  Take 100 mg by mouth 2 (two) times daily.     diltiazem 30 MG tablet  Commonly known as:  CARDIZEM  Take 30 mg by mouth every 4 (four) hours as needed (HR >100 and BP >100).     dorzolamide-timolol 22.3-6.8 MG/ML ophthalmic solution  Commonly known as:  COSOPT  Place 1 drop into the left eye 3 (three) times daily.     doxazosin 4 MG tablet  Commonly known as:  CARDURA  Take 2 mg by mouth daily.     dronedarone 400 MG tablet  Commonly known as:  MULTAQ  Take 400 mg by mouth 2 (two) times daily with a meal.     ELIQUIS 5 MG Tabs tablet  Generic drug:  apixaban  TAKE 1 TABLET TWICE DAILY.     FLUoxetine 20 MG capsule  Commonly known as:  PROZAC  TAKE (1) CAPSULE DAILY.     Ginkgo Biloba 40 MG Tabs  Take 40 mg by mouth daily.     Melatonin 5 MG Tabs  Take 5 mg by mouth at bedtime.     multivitamin with minerals Tabs tablet  Take 1 tablet by mouth daily. Centrum Silver  oxyCODONE 5 MG immediate release tablet  Commonly known as:  ROXICODONE  Take 1 tablet (5 mg total) by mouth every 6 (six) hours as needed.     POLY-IRON 150 150 MG capsule  Generic drug:  iron polysaccharides  Take 150 mg by mouth daily.     ranitidine 150 MG tablet  Commonly known as:  ZANTAC  Take 150 mg by mouth daily as needed for heartburn.     rosuvastatin 40 MG tablet  Commonly known as:  CRESTOR  TAKE 1 TABLET ONCE DAILY.        Prescriptions  given: 1.  Roxicodone #30 No Refill  Instructions: 1.  Wash the groin wound with soap and water daily and pat dry. (No tub bath-only shower)  Then put a dry gauze or washcloth there to keep this area dry daily and as needed.  Do not use Vaseline or neosporin on your incisions.  Only use soap and water on your incisions and then protect and keep dry.  Disposition: home  Patient's condition: is Good  Follow up: 1. Dr. [name] in 2 weeks   Leontine Locket, PA-C [facility] [phone] 08/31/2015  7:58 AM  - For VQI Registry use --- Instructions: Press F2 to tab through selections.  Delete question if not applicable.   Post-op:  Wound infection: No  Graft infection: No  Transfusion: No  If yes, n/a units given New Arrhythmia: No Ipsilateral amputation: No, '[ ]'$  Minor, '[ ]'$  BKA, '[ ]'$  AKA Discharge patency: '[ ]'$  Primary, [ x] Primary assisted, '[ ]'$  Secondary, '[ ]'$  Occluded Patency judged by: [x ] Dopper only, '[ ]'$  Palpable graft pulse, '[]'$  Palpable distal pulse, '[ ]'$  ABI inc. > 0.15, '[ ]'$  Duplex Discharge ABI: R not done, L  D/C Ambulatory Status: Ambulatory  Complications: MI: No, '[ ]'$  Troponin only, '[ ]'$  EKG or Clinical CHF: No Resp failure:No, '[ ]'$  Pneumonia, '[ ]'$  Ventilator Chg in renal function: No, '[ ]'$  Inc. Cr > 0.5, '[ ]'$  Temp. Dialysis, '[ ]'$  Permanent dialysis Stroke: No, '[ ]'$  Minor, '[ ]'$  Major Return to OR: No  Reason for return to OR: '[ ]'$  Bleeding, '[ ]'$  Infection, '[ ]'$  Thrombosis, '[ ]'$  Revision  Discharge medications: Statin use:  yes ASA use:  no Plavix use:  No Eliquis:  Yes Beta blocker use: no ACEI use:   no ARB use:  no Coumadin use: no

## 2015-09-01 NOTE — Telephone Encounter (Signed)
See previous phone note from Dr. Radford Pax

## 2015-09-01 NOTE — Telephone Encounter (Signed)
Letter should be sent to:   [address] [place], [place] [address]

## 2015-09-01 NOTE — Telephone Encounter (Signed)
Patient is informed of results.   Also spoke with wife for details.   Okay to proceed with titration.   Will schedule and send letter.

## 2015-09-02 ENCOUNTER — Encounter: Payer: Self-pay | Admitting: *Deleted

## 2015-09-02 ENCOUNTER — Encounter: Payer: Self-pay | Admitting: Internal Medicine

## 2015-09-02 NOTE — Addendum Note (Signed)
Addended by: Andres Ege on: 09/02/2015 10:32 AM   Modules accepted: Orders

## 2015-09-08 ENCOUNTER — Encounter: Payer: Self-pay | Admitting: Vascular Surgery

## 2015-09-08 ENCOUNTER — Ambulatory Visit (INDEPENDENT_AMBULATORY_CARE_PROVIDER_SITE_OTHER): Payer: BLUE CROSS/BLUE SHIELD | Admitting: Vascular Surgery

## 2015-09-08 VITALS — BP 135/71 | HR 70 | Temp 98.1°F | Resp 16 | Ht 73.0 in | Wt 203.0 lb

## 2015-09-08 DIAGNOSIS — I739 Peripheral vascular disease, unspecified: Secondary | ICD-10-CM

## 2015-09-08 NOTE — Progress Notes (Signed)
Patient is a 74 year old male who underwent vein patch angioplasty of his proximal anastomosis of his left femoropopliteal bypass graft by Dr. [name] on November 29. He noticed an area in his left groin that appear to be more swollen. He denies any drainage. He denies any fever or chills. It has not become progressively more large.  Physical exam:  Filed Vitals:   09/08/15 1056  BP: 135/71  Pulse: 70  Temp: 98.1 F (36.7 C)  TempSrc: Oral  Resp: 16  Height: '6\' 1"'$  (1.854 m)  Weight: 203 lb (92.08 kg)  SpO2: 96%    Healing left ankle and left groin incision 2 x 2 centimeter mass left groin no skin erythema no drainage not really tender  Assessment: Healing left groin incision possible small hematoma or seroma but clinically not really symptomatic  Plan: Follow-up Dr. [name] as scheduled December 14  Ruta Hinds, MD [facility] [place] Office: [phone] Pager: [phone]

## 2015-09-12 ENCOUNTER — Telehealth: Payer: Self-pay | Admitting: Internal Medicine

## 2015-09-12 ENCOUNTER — Ambulatory Visit (INDEPENDENT_AMBULATORY_CARE_PROVIDER_SITE_OTHER): Payer: BLUE CROSS/BLUE SHIELD | Admitting: *Deleted

## 2015-09-12 DIAGNOSIS — I480 Paroxysmal atrial fibrillation: Secondary | ICD-10-CM

## 2015-09-12 NOTE — Telephone Encounter (Signed)
New MEssage  Pt wife called to speak w/ Device on why remote check was sched for today- 12/12. Please call back and discuss.

## 2015-09-12 NOTE — Telephone Encounter (Signed)
Spoke w/ pt wife and attempted to explain to her the monthly summary report appointment. Pt wife stated that she understood.

## 2015-09-12 NOTE — Telephone Encounter (Signed)
New Message  Pt calling to see if remote transmission was sent successfully. Please call back and discuss.

## 2015-09-12 NOTE — Telephone Encounter (Signed)
Informed pt that his transmission received. And that as long as he sleeps by his monitor it will send [name] the monthly report automatically. He verbalized understanding.

## 2015-09-14 ENCOUNTER — Ambulatory Visit (INDEPENDENT_AMBULATORY_CARE_PROVIDER_SITE_OTHER): Payer: BLUE CROSS/BLUE SHIELD | Admitting: Vascular Surgery

## 2015-09-14 ENCOUNTER — Encounter: Payer: Self-pay | Admitting: Vascular Surgery

## 2015-09-14 VITALS — BP 139/65 | HR 74 | Temp 98.8°F | Ht 73.0 in | Wt 203.7 lb

## 2015-09-14 DIAGNOSIS — Z48812 Encounter for surgical aftercare following surgery on the circulatory system: Secondary | ICD-10-CM

## 2015-09-14 NOTE — Progress Notes (Signed)
Carelink Summary Report / Loop Recorder 

## 2015-09-14 NOTE — Progress Notes (Signed)
Patient name: Troy Tuazon [name] MRN: [id number] DOB: 06-Mar-1941 Sex: male  REASON FOR VISIT: Follow up after surgery  HPI: Troy Comer [name] is a 74 y.o. male who underwent a left femoropopliteal bypass graft in [place]. He subsequently required revision of the distal graft. On follow up study in our office he was noted to have markedly elevated velocities in the proximal graft just beyond the anastomosis. I felt that he was at risk for graft thrombosis. On 08/30/2015 he underwent vein patch angioplasty of the proximal left femoropopliteal bypass graft using left great saphenous vein. He comes in for an outpatient visit.  He initially had some swelling in the left groin but this has resolved. He has no specific complaints today. He has been gradually resuming his activities.  Current Outpatient Prescriptions  Medication Sig Dispense Refill  . bimatoprost (LUMIGAN) 0.01 % SOLN Place 1 drop into the left eye at bedtime.    . Biotin 5000 MCG TABS Take 5,000 mcg by mouth daily.    . bisacodyl (DULCOLAX) 5 MG EC tablet Take 5 mg by mouth daily as needed for moderate constipation.    . brimonidine (ALPHAGAN P) 0.1 % SOLN Place 3 drops into the left eye 3 (three) times daily.     . cilostazol (PLETAL) 100 MG tablet Take 100 mg by mouth 2 (two) times daily.    [name] diltiazem (CARDIZEM CD) 120 MG 24 hr capsule Take 120 mg by mouth daily.    . dorzolamide-timolol (COSOPT) 22.3-6.8 MG/ML ophthalmic solution Place 1 drop into the left eye 3 (three) times daily.     [name] doxazosin (CARDURA) 4 MG tablet Take 2 mg by mouth daily.    [name] dronedarone (MULTAQ) 400 MG tablet Take 400 mg by mouth 2 (two) times daily with a meal.    . ELIQUIS 5 MG TABS tablet TAKE 1 TABLET TWICE DAILY. (Patient taking differently: TAKE 1 TABLET BY MOUTH TWICE DAILY) 180 tablet 3  . FLUoxetine (PROZAC) 20 MG capsule TAKE (1) CAPSULE DAILY. 90 capsule 0  . Ginkgo Biloba 40 MG TABS Take 40 mg by mouth daily.     . iron polysaccharides  (POLY-IRON 150) 150 MG capsule Take 150 mg by mouth daily.    . Melatonin 5 MG TABS Take 5 mg by mouth at bedtime.    . Multiple Vitamin (MULTIVITAMIN WITH MINERALS) TABS tablet Take 1 tablet by mouth daily. Centrum Silver    . naproxen sodium (ALEVE) 220 MG tablet Take 220 mg by mouth daily as needed (pain).    . ranitidine (ZANTAC) 150 MG tablet Take 150 mg by mouth daily as needed for heartburn.     . rosuvastatin (CRESTOR) 40 MG tablet TAKE 1 TABLET ONCE DAILY. (Patient taking differently: TAKE 40 MG BY MOUTH ONCE DAILY AT BEDTIME) 90 tablet 3  . diltiazem (CARDIZEM) 30 MG tablet Take 30 mg by mouth every 4 (four) hours as needed (HR >100 and BP >100). Reported on 09/14/2015    . oxyCODONE (ROXICODONE) 5 MG immediate release tablet Take 1 tablet (5 mg total) by mouth every 6 (six) hours as needed. (Patient not taking: Reported on 09/14/2015) 30 tablet 0   No current facility-administered medications for this visit.    REVIEW OF SYSTEMS:  '[X]'$  denotes positive finding, '[ ]'$  denotes negative finding Cardiac  Comments:  Chest pain or chest pressure:    Shortness of breath upon exertion: X   Short of breath when lying flat:    Irregular heart  rhythm:    Constitutional    Fever or chills:      PHYSICAL EXAM: Filed Vitals:   09/14/15 1436  BP: 139/65  Pulse: 74  Height: '6\' 1"'$  (1.854 m)  Weight: 203 lb 11.2 oz (92.398 kg)  SpO2: 95%    GENERAL: The patient is a well-nourished male, in no acute distress. The vital signs are documented above. CARDIOVASCULAR: There is a regular rate and rhythm. PULMONARY: There is good air exchange bilaterally without wheezing or rales. The left groin incision is healing nicely. The vein harvest site above the ankle is healing nicely. The left foot is warm and well-perfused.  MEDICAL ISSUES: The patient is doing well status post revision of his left femoropopliteal bypass graft with a vein patch angioplasty of the proximal graft. I will order a follow  duplex a graft in 3 months and ABIs at that time. I've encouraged him to resume his normal activities. He knows to call sooner if he has problems.  Deitra Mayo [facility] [place]: [phone]

## 2015-09-15 NOTE — Addendum Note (Signed)
Addended by: Dorthula Rue L on: 09/15/2015 02:20 PM   Modules accepted: Orders

## 2015-09-19 ENCOUNTER — Ambulatory Visit: Payer: BLUE CROSS/BLUE SHIELD | Admitting: Cardiology

## 2015-09-19 ENCOUNTER — Encounter: Payer: Self-pay | Admitting: Internal Medicine

## 2015-09-19 ENCOUNTER — Ambulatory Visit (INDEPENDENT_AMBULATORY_CARE_PROVIDER_SITE_OTHER): Payer: BLUE CROSS/BLUE SHIELD | Admitting: Internal Medicine

## 2015-09-19 VITALS — BP 116/70 | HR 70 | Ht 73.0 in | Wt 203.6 lb

## 2015-09-19 DIAGNOSIS — I6523 Occlusion and stenosis of bilateral carotid arteries: Secondary | ICD-10-CM

## 2015-09-19 DIAGNOSIS — I480 Paroxysmal atrial fibrillation: Secondary | ICD-10-CM

## 2015-09-19 LAB — CUP PACEART INCLINIC DEVICE CHECK: MDC IDC SESS DTM: 20161219153658

## 2015-09-19 NOTE — Progress Notes (Signed)
Electrophysiology Office Note   Date:  09/19/2015   ID:  Troy Kenner [name], DOB Mar 21, 1941, MRN [id number]  PCP:  [name], MD  Cardiologist:  [name] Primary Electrophysiologist: [name]    Chief Complaint  Patient presents with  . PVD  . PAF  . Transient cerebral ischemia     History of Present Illness: Troy Wedel [name] is a 74 y.o. male who presents today for electrophysiology evaluation secondary to his PAfib.  He has PMHx ofHTN, HLD, COPD, carotid stenosis status post right CEA in 2014 after what he states was a TIA.  He is s/p ILR implant by me to better evaluate his palpitations.  He appears to not be having afib at this time.  Episodes are currently sinus with PACs.  Today, he denies symptoms of chest pain, shortness of breath, orthopnea, PND, lower extremity edema, claudication, dizziness, presyncope, syncope, bleeding, or neurologic sequela. The patient is tolerating medications without difficulties and is otherwise without complaint today.    Past Medical History  Diagnosis Date  . Hypertension   . Hyperlipidemia   . Glaucoma BOTH EYES    Dr Gershon Crane  . Hx of adenomatous colonic polyps 2005     X 2; 1 hyperplastic polyp; Dr Olevia Perches  . Impaired fasting glucose 2007    108; A1c5.4%  . History of lung cancer APRIL 2012  SQUAMOUS CELL---- S/P RIGHT LOWER LOBECTOMY AT [facility] --  NO CHEMORADIATION---  NO RECURRENCE     ONCOLOGIST- DR Tressie Stalker  LOV IN [facility] 10-27-2012  . GERD (gastroesophageal reflux disease)   . BPH (benign prostatic hypertrophy)   . Frequency of urination   . Urgency of urination   . Nocturia   . Lesion of bladder   . Microhematuria   . Arthritis   . History of basal cell carcinoma excision   . Exertional dyspnea   . Peripheral vascular disease ([place]) S/P ANGIOPLASTY AND STENTING    FOLLOWED  BY DR [name]  . PAD (peripheral artery disease) ([place]) ABI'S  JAN 2014  0.65 ON RIGHT ;  1.04 ON LEFT  . AAA (abdominal aortic aneurysm) ([facility]) LAST  ABD. [name]  JAN 2014  3.4CM    MONITORED BY DR [name]  . Bilateral carotid artery stenosis DUPLEX 12-29-2012  BY DR [facility]    BILATERAL ICA STENOSIS 60-79%  . COPD (chronic obstructive pulmonary disease) ([place])   . Stroke [facility]) Jul 07, 2013    mini  TIA  . Cancer ([place]) 01-25-12    LUNG  . Atrial fibrillation ([place])   . Spinal stenosis Sept. 2015  . OSA (obstructive sleep apnea) 08/29/2015    Mild with AHI overall 6/hr but severe during REM sleep with AHI 30/hr with oxygen saturations < 88% for 5.5 minutes of total sleep time.    . Complication of anesthesia     had nausea after carotid artery surgery, states the medicine given to help the nausea, made it worse   Past Surgical History  Procedure Laterality Date  . Angio plasty      X 4 in legs  . Colon polyectomy    . Trabecular surgery      OS  . Lung lobectomy  01/24/2011     RIGHT UPPER LOBE  (SQUAMOUS CELL CARCINOMA) Dr Dorthea Cove , [facility]. No chemotherapyor radiation  . Laryngoscopy  06-27-2004    BX VOCAL CORD  (LEUKOPLAKIA)  PER PT NO ISSUES SINCE  . Cataract extraction w/ intraocular lens  implant, bilateral    .  Basal cell carcinoma excision      MULTIPLE TIMES--  RIGHT FOREARM, CHEEKS, AND BACK  . Femoral-popliteal bypass graft Left 1994  [facility]    AND 2001  IN [place]  . Cardiovascular stress test  12-08-2012  DR [facility]    NORMAL LEXISCAN WITH NO EXERCISE NUCLEAR STUDY/ EF 66%/   NO ISCHEMIA/ NO SIGNIFICANT CHANGE FROM PRIOR STUDY  . Transthoracic echocardiogram  12-29-2012  DR [facility]    MILD LVH/  LVSF NORMAL/ EF 08-65%/  GRADE I DIASTOLIC DYSFUNCTION  . Aortogram  07-27-2002    MILD DIFFUSE ILIAC ARTERY OCCLUSIVE DISEASE /  LEFT RENAL ARTERY 20%/ PATENT LEFT FEM-POP GRAFT/ MILD SFA AND POPLITEAL ARTERY OCCLUSIVE DISEASE W/ SEVERE KIDNEY OCCLUSIVE DISEASE  . Cystoscopy w/ retrogrades Bilateral 01/21/2013    Procedure: CYSTOSCOPY WITH RETROGRADE PYELOGRAM;  Surgeon: Molli Hazard, MD;  Location: [facility];  Service: Urology;  Laterality: Bilateral;   CYSTO, BLADDER BIOPSY, BILATERAL RETROGRADE PYELOGRAM  RAD TECH FROM RADIOLOGY PER JOY  . Cystoscopy with biopsy N/A 01/21/2013    Procedure: CYSTOSCOPY WITH BIOPSY;  Surgeon: Molli Hazard, MD;  Location: [facility];  Service: Urology;  Laterality: N/A;  . Endarterectomy Right 07/14/2013    Procedure: ENDARTERECTOMY CAROTID;  Surgeon: Angelia Mould, MD;  Location: [place];  Service: Vascular;  Laterality: Right;  . Patch angioplasty Right 07/14/2013    Procedure: PATCH ANGIOPLASTY;  Surgeon: Angelia Mould, MD;  Location: [facility] OR;  Service: Vascular;  Laterality: Right;  . Carotid endarterectomy Right 07-14-13    cea  . Carotid angiogram N/A 07/10/2013    Procedure: CAROTID ANGIOGRAM;  Surgeon: Elam Dutch, MD;  Location: [facility] CATH LAB;  Service: Cardiovascular;  Laterality: N/A;  . Ep implantable device N/A 08/12/2015    Procedure: Loop Recorder Insertion;  Surgeon: Thompson Grayer, MD;  Location: [place] CV LAB;  Service: Cardiovascular;  Laterality: N/A;  . Peripheral vascular catheterization N/A 08/29/2015    Procedure: Abdominal Aortogram;  Surgeon: Angelia Mould, MD;  Location: [place] CV LAB;  Service: Cardiovascular;  Laterality: N/A;  . Lower extremity angiogram Bilateral 08/29/2015    Procedure: Lower Extremity Angiogram;  Surgeon: Angelia Mould, MD;  Location: [place] CV LAB;  Service: Cardiovascular;  Laterality: Bilateral;  . Femoral-popliteal bypass graft Left 08/30/2015    Procedure: REVISION OF BYPASS GRAFT Left  FEMORAL-POPLITEAL ARTERY;  Surgeon: Angelia Mould, MD;  Location: [place];  Service: Vascular;  Laterality: Left;  . Patch angioplasty Left 08/30/2015    Procedure: VEIN PATCH ANGIOPLASTY OF PROXIMAL Left BYPASS GRAFT;  Surgeon: Angelia Mould, MD;  Location: [place];  Service: Vascular;  Laterality: Left;     Current Outpatient  Prescriptions  Medication Sig Dispense Refill  . bimatoprost (LUMIGAN) 0.01 % SOLN Place 1 drop into the left eye at bedtime.    . Biotin 5000 MCG TABS Take 5,000 mcg by mouth daily.    . bisacodyl (DULCOLAX) 5 MG EC tablet Take 5 mg by mouth daily as needed for moderate constipation.    . brimonidine (ALPHAGAN P) 0.1 % SOLN Place 3 drops into the left eye 3 (three) times daily.     . cilostazol (PLETAL) 100 MG tablet Take 100 mg by mouth 2 (two) times daily.    [name] diltiazem (CARDIZEM CD) 120 MG 24 hr capsule Take 120 mg by mouth daily.    [name] diltiazem (CARDIZEM) 30 MG tablet Take 30 mg by  mouth every 4 (four) hours as needed (HR >100 and BP >100). Reported on 09/14/2015    . dorzolamide-timolol (COSOPT) 22.3-6.8 MG/ML ophthalmic solution Place 1 drop into the left eye 3 (three) times daily.     [name] doxazosin (CARDURA) 4 MG tablet Take 2 mg by mouth daily.    [name] dronedarone (MULTAQ) 400 MG tablet Take 400 mg by mouth 2 (two) times daily with a meal.    . ELIQUIS 5 MG TABS tablet TAKE 1 TABLET TWICE DAILY. (Patient taking differently: TAKE 1 TABLET BY MOUTH TWICE DAILY) 180 tablet 3  . FLUoxetine (PROZAC) 20 MG capsule TAKE (1) CAPSULE DAILY. 90 capsule 0  . Ginkgo Biloba 40 MG TABS Take 40 mg by mouth daily.     . iron polysaccharides (POLY-IRON 150) 150 MG capsule Take 150 mg by mouth daily.    . Melatonin 5 MG TABS Take 5 mg by mouth at bedtime.    . Multiple Vitamin (MULTIVITAMIN WITH MINERALS) TABS tablet Take 1 tablet by mouth daily. Centrum Silver    . naproxen sodium (ALEVE) 220 MG tablet Take 220 mg by mouth daily as needed (pain).    . ranitidine (ZANTAC) 150 MG tablet Take 150 mg by mouth daily as needed for heartburn.     . rosuvastatin (CRESTOR) 40 MG tablet TAKE 1 TABLET ONCE DAILY. (Patient taking differently: TAKE 40 MG BY MOUTH ONCE DAILY AT BEDTIME) 90 tablet 3   No current facility-administered medications for this visit.    Allergies:   Penicillins; Sulfonamide derivatives;  Niacin-lovastatin er; and Atorvastatin   Social History:  The patient  reports that he quit smoking about 2 years ago. His smoking use included Cigarettes. He has a 25 pack-year smoking history. He has never used smokeless tobacco. He reports that he drinks alcohol. He reports that he does not use illicit drugs.   Family History:  The patient's family history includes Alcohol abuse in his father; Heart attack in his father; Heart disease in his father, paternal aunt, and paternal uncle; Hypertension in his father and paternal aunt; Stroke in his father, mother, paternal aunt, and paternal uncle.    ROS:  Please see the history of present illness.   All other systems are reviewed and negative.    PHYSICAL EXAM: VS:  BP 116/70 mmHg  Pulse 70  Ht '6\' 1"'$  (1.854 m)  Wt 203 lb 9.6 oz (92.352 kg)  BMI 26.87 kg/m2 , BMI Body mass index is 26.87 kg/(m^2). GEN: Well nourished, well developed, in no acute distress HEENT: normal Neck: no JVD, carotid bruits, or masses Cardiac: RRR; no murmurs, rubs, or gallops,no edema  Respiratory:  clear to auscultation bilaterally, normal work of breathing GI: soft, nontender, nondistended, + BS MS: no deformity or atrophy Skin: warm and dry  Neuro:  Strength and sensation are intact Psych: euthymic mood, full affect  EKG:  EKG is ordered today. The ekg ordered today shows SR  02/15/15: Echocardiogram Study Conclusions - Left ventricle: The cavity size was normal. Wall thickness was normal. Systolic function was normal. The estimated ejection fraction was in the range of 60% to 65%. Wall motion was normal; there were no regional wall motion abnormalities. Impressions: - The image quality is poor.    Recent Labs: 02/08/2015: TSH 1.24 08/30/2015: ALT 28 08/31/2015: BUN 9; Creatinine, Ser 0.94; Hemoglobin 11.0*; Platelets 213; Potassium 4.9; Sodium 134*    Lipid Panel     Component Value Date/Time   CHOL 149 02/08/2015 0842  TRIG 106.0  02/08/2015 0842   TRIG 97 10/11/2006 1129   HDL 42.00 02/08/2015 0842   CHOLHDL 4 02/08/2015 0842   CHOLHDL 4.0 CALC 10/11/2006 1129   VLDL 21.2 02/08/2015 0842   LDLCALC 86 02/08/2015 0842     Wt Readings from Last 3 Encounters:  09/19/15 203 lb 9.6 oz (92.352 kg)  09/14/15 203 lb 11.2 oz (92.398 kg)  09/08/15 203 lb (92.08 kg)      Other studies Reviewed: Additional studies/ records that were reviewed today include: ILR interrogation is reviewed today   ASSESSMENT AND PLAN:  1.  PAFib      CHADS2-Vasc is at least 4 (he denies HTN) on Eliquis and Multaq      ILR reveals that afib is currently well controlled (as suspected).  He appears to have sinus with PACs as his primary cause for symptoms.  I will follow remotely with ILR and see when needed.  He will follow-up with Dr Aundra Dubin in 3 months      Current medicines are reviewed at length with the patient today.   The patient does not have concerns regarding his medicines.  The following changes were made today:  none   Army Fossa MD  09/19/2015 11:45 AM     [facility] [address] [place] [address] [phone] (office) [phone] (fax)

## 2015-09-19 NOTE — Patient Instructions (Signed)
Medication Instructions:  Your physician recommends that you continue on your current medications as directed. Please refer to the Current Medication list given to you today.   Labwork: None ordered   Testing/Procedures: None ordered   Follow-Up: Your physician recommends that you schedule a follow-up appointment in: 3 months with Dr Aundra Dubin and as needed with Dr Rayann Heman   Any Other Special Instructions Will Be Listed Below (If Applicable).     If you need a refill on your cardiac medications before your next appointment, please call your pharmacy.

## 2015-09-22 ENCOUNTER — Encounter: Payer: Self-pay | Admitting: Internal Medicine

## 2015-10-11 ENCOUNTER — Other Ambulatory Visit: Payer: Self-pay | Admitting: Cardiology

## 2015-10-11 ENCOUNTER — Ambulatory Visit (INDEPENDENT_AMBULATORY_CARE_PROVIDER_SITE_OTHER): Payer: BLUE CROSS/BLUE SHIELD | Admitting: *Deleted

## 2015-10-11 DIAGNOSIS — I480 Paroxysmal atrial fibrillation: Secondary | ICD-10-CM

## 2015-10-12 NOTE — Progress Notes (Signed)
Carelink Summary Report / Loop Recorder 

## 2015-10-14 ENCOUNTER — Encounter: Payer: Self-pay | Admitting: Vascular Surgery

## 2015-10-18 ENCOUNTER — Other Ambulatory Visit: Payer: Self-pay | Admitting: Cardiology

## 2015-10-27 ENCOUNTER — Ambulatory Visit (HOSPITAL_BASED_OUTPATIENT_CLINIC_OR_DEPARTMENT_OTHER): Payer: BLUE CROSS/BLUE SHIELD | Attending: Cardiology | Admitting: *Deleted

## 2015-10-27 DIAGNOSIS — I4891 Unspecified atrial fibrillation: Secondary | ICD-10-CM | POA: Diagnosis not present

## 2015-10-27 DIAGNOSIS — Z7901 Long term (current) use of anticoagulants: Secondary | ICD-10-CM | POA: Diagnosis not present

## 2015-10-27 DIAGNOSIS — Z79899 Other long term (current) drug therapy: Secondary | ICD-10-CM | POA: Insufficient documentation

## 2015-10-27 DIAGNOSIS — G4733 Obstructive sleep apnea (adult) (pediatric): Secondary | ICD-10-CM | POA: Diagnosis present

## 2015-10-27 DIAGNOSIS — I493 Ventricular premature depolarization: Secondary | ICD-10-CM | POA: Insufficient documentation

## 2015-10-27 DIAGNOSIS — R0683 Snoring: Secondary | ICD-10-CM | POA: Insufficient documentation

## 2015-11-01 ENCOUNTER — Encounter: Payer: Self-pay | Admitting: Cardiology

## 2015-11-01 ENCOUNTER — Telehealth: Payer: Self-pay | Admitting: Cardiology

## 2015-11-01 NOTE — Addendum Note (Signed)
Addended by: Sueanne Margarita on: 11/01/2015 09:56 PM   Modules accepted: Orders

## 2015-11-01 NOTE — Telephone Encounter (Signed)
This encounter was created in error - please disregard.

## 2015-11-01 NOTE — Telephone Encounter (Signed)
Pt had successful PAP titration. Please setup appointment in 10 weeks. Please let [facility] know that order for PAP is in EPIC.   

## 2015-11-01 NOTE — Sleep Study (Signed)
   Patient Name: Troy [name], Troy [name] MRN: [id number] Study Date: 10/27/2015 Gender: Male D.O.B: 30-Nov-1940 Age (years): 86 Referring Provider: Sherran Needs Interpreting Physician: Fransico Him MD, ABSM RPSGT: [place], Holly  BMI: 27 Height (inches): 72 Weight (lbs): 200 Neck Size: 18.00  CLINICAL INFORMATION The patient is referred for a CPAP titration to treat sleep apnea. Date of NPSG, Split Night or HST:09/14/2015  SLEEP STUDY TECHNIQUE As per the AASM Manual for the Scoring of Sleep and Associated Events v2.3 (April 2016) with a hypopnea requiring 4% desaturations. The channels recorded and monitored were frontal, central and occipital EEG, electrooculogram (EOG), submentalis EMG (chin), nasal and oral airflow, thoracic and abdominal wall motion, anterior tibialis EMG, snore microphone, electrocardiogram, and pulse oximetry. Continuous positive airway pressure (CPAP) was initiated at the beginning of the study and titrated to treat sleep-disordered breathing.  MEDICATIONS Medications taken by the patient : Melatonin, Lumingan, Biotin, Ducolax, Alphagan, Cardizem, Cosopt, Doxazosin, Multaq, Eiliquis, Prozac, Iron, Naproxen, Zantac, Crestor Medications administered by patient during sleep study : No sleep medicine administered.  TECHNICIAN COMMENTS Comments added by technician: NONE  Comments added by scorer: N/A  RESPIRATORY PARAMETERS Optimal PAP Pressure (cm):10  AHI at Optimal Pressure (/hr):1.9 Overall Minimal O2 (%):89.00   Supine % at Optimal Pressure (%):100 Minimal O2 at Optimal Pressure (%):89.0    SLEEP ARCHITECTURE The study was initiated at 11:04:32 PM and ended at 5:22:51 AM. Sleep onset time was 32.1 minutes and the sleep efficiency was reduced at 68.2%. The total sleep time was 258.0 minutes. The patient spent 8.14% of the night in stage N1 sleep, 79.07% in stage N2 sleep, 0.00% in stage N3 and 12.79% in REM.Stage REM latency was 218.0 minutes Wake after  sleep onset was 88.2. Alpha intrusion was absent. Supine sleep was 65.49%.  CARDIAC DATA The 2 lead EKG demonstrated atrial fibrillation. The mean heart rate was 61.13 beats per minute. Other EKG findings include: PVCs.  LEG MOVEMENT DATA The total Periodic Limb Movements of Sleep (PLMS) were 145. The PLMS index was 33.72. A PLMS index of <15 is considered normal in adults.  IMPRESSIONS - The optimal PAP pressure was 10 cm of water. - Central sleep apnea was not noted during this titration (CAI = 0.0/h). - Mild oxygen desaturations were observed during this titration (min O2 = 89.00%). - The patient snored with Soft snoring volume during this titration study. - 2-lead EKG demonstrated: PVCs - Moderate periodic limb movements were observed during this study. Arousals associated with PLMs were rare.  DIAGNOSIS - Obstructive Sleep Apnea (327.23 [G47.33 ICD-10])  RECOMMENDATIONS - Trial of CPAP therapy on 10 cm H2O with a Large size Fisher&Paykel Full Face Mask Simplus mask and heated humidification. - Avoid alcohol, sedatives and other CNS depressants that may worsen sleep apnea and disrupt normal sleep architecture. - Sleep hygiene should be reviewed to assess factors that may improve sleep quality. - Weight management and regular exercise should be initiated or continued. - Return to Sleep Center for re-evaluation after 10 weeks of therapy   [place], American Board of Sleep Medicine  ELECTRONICALLY SIGNED ON:  11/01/2015, 9:50 PM [place] PH: [phone]   FX: [phone] [place]

## 2015-11-02 ENCOUNTER — Telehealth: Payer: Self-pay | Admitting: Cardiology

## 2015-11-02 NOTE — Telephone Encounter (Signed)
New Message  Pt wife calling to speak w/ RN concerning what to do going foraward after recent sleep titration. Can call mobile # if home # is not answered. Please call back and discuss.

## 2015-11-04 LAB — CUP PACEART REMOTE DEVICE CHECK: MDC IDC SESS DTM: 20161212202501

## 2015-11-04 NOTE — Telephone Encounter (Signed)
See previous phone note from Dr. Radford Pax

## 2015-11-04 NOTE — Telephone Encounter (Signed)
Attempted to contact patient - no answer, no voicemail.  Will try to reach back out to him before the end of the day

## 2015-11-06 ENCOUNTER — Other Ambulatory Visit: Payer: Self-pay | Admitting: Internal Medicine

## 2015-11-07 NOTE — Telephone Encounter (Signed)
DPR on file for patient's wife. She is aware of what the next step is and they are okay to proceed. Message sent to [facility] for orders. Once patient has started CPAP therapy I will schedule 10 week follow-up

## 2015-11-10 ENCOUNTER — Ambulatory Visit (INDEPENDENT_AMBULATORY_CARE_PROVIDER_SITE_OTHER): Payer: BLUE CROSS/BLUE SHIELD | Admitting: *Deleted

## 2015-11-10 DIAGNOSIS — I480 Paroxysmal atrial fibrillation: Secondary | ICD-10-CM | POA: Diagnosis not present

## 2015-11-10 NOTE — Progress Notes (Signed)
Carelink Summary Report / Loop Recorder 

## 2015-11-28 ENCOUNTER — Ambulatory Visit (INDEPENDENT_AMBULATORY_CARE_PROVIDER_SITE_OTHER): Payer: BLUE CROSS/BLUE SHIELD | Admitting: Internal Medicine

## 2015-11-28 ENCOUNTER — Encounter: Payer: Self-pay | Admitting: Internal Medicine

## 2015-11-28 VITALS — BP 142/68 | HR 57 | Temp 97.8°F | Resp 18 | Wt 205.0 lb

## 2015-11-28 DIAGNOSIS — I480 Paroxysmal atrial fibrillation: Secondary | ICD-10-CM | POA: Diagnosis not present

## 2015-11-28 DIAGNOSIS — N183 Chronic kidney disease, stage 3 unspecified: Secondary | ICD-10-CM

## 2015-11-28 DIAGNOSIS — Z23 Encounter for immunization: Secondary | ICD-10-CM | POA: Diagnosis not present

## 2015-11-28 DIAGNOSIS — J439 Emphysema, unspecified: Secondary | ICD-10-CM

## 2015-11-28 DIAGNOSIS — E782 Mixed hyperlipidemia: Secondary | ICD-10-CM

## 2015-11-28 DIAGNOSIS — G4733 Obstructive sleep apnea (adult) (pediatric): Secondary | ICD-10-CM

## 2015-11-28 NOTE — Assessment & Plan Note (Signed)
Chronic SOB, stable

## 2015-11-28 NOTE — Assessment & Plan Note (Addendum)
Taking crestor 40 mg nightly Lipid panel fairly controlled Advised to increase exercise

## 2015-11-28 NOTE — Assessment & Plan Note (Signed)
Uses cpap nightly  

## 2015-11-28 NOTE — Assessment & Plan Note (Signed)
Paroxysmal Afib - experiences dizziness Following with cardiology Management per cardiology

## 2015-11-28 NOTE — Progress Notes (Signed)
Pre visit review using our clinic review tool, if applicable. No additional management support is needed unless otherwise documented below in the visit note. 

## 2015-11-28 NOTE — Progress Notes (Signed)
Subjective:    Patient ID: Troy Kenner [name], male    DOB: [birth year], 75 y.o.   MRN: [id number]  HPI He is here to establish with a new pcp.   He is here for follow up.    Afib, carotid artery stenosis, PAD with claudication, AAA, TIA: he follows with cardiology and vascular surgery.  He has had multiple vascular procedures and most recent had a revision of his left leg bypass.  He has PAD and claudication with walking.  He states he does exercises - goes to a trainer twice a week, golfs and hunts.    History of lung cancer, COPD: He had part of his lung removed. Since quitting smoking he denies cough or wheeze.  He has some sob,which is chronic and unchanged.    Hyperlipidemia: He is taking his medication daily. He is compliant with a low fat/cholesterol diet. He is exercising minimally. He denies myalgias.    Microscopic hematuria:  He has seen urology and had a cystoscopy.  He was referred to nephrology for further evaluation of the blood in his urine.  He has chronic kidney disease.    He exercises twice a week - goes to a trainer.  No exercise in between, he hunts and plays golf a little. He admits he does not walk as much as he should.    OSA on cpap:  He uses the cpap nightly.    Medications and allergies reviewed with patient and updated if appropriate.  Patient Active Problem List   Diagnosis Date Noted  . Atherosclerotic peripheral vascular disease ([place]) 08/29/2015  . OSA (obstructive sleep apnea) 08/29/2015  . PAF (paroxysmal atrial fibrillation) ([place])   . Glaucoma 06/22/2015  . Dyspnea 02/04/2015  . Memory loss, short term 08/03/2014  . Aftercare following surgery of the circulatory system 07/28/2014  . Pain of right lower leg- and Left Thigh and leg 07/28/2014  . Atrial fibrillation ([place]) 02/14/2014  . Anemia, unspecified 12/28/2013  . Glaucoma 12/28/2013  . Carotid stenosis 07/24/2013  . Occlusion and stenosis of carotid artery without mention of cerebral  infarction 07/09/2013  . Smoker 07/09/2013  . COPD with emphysema ([place]) 07/09/2013  . Chronic renal disease, stage 3, moderately decreased glomerular filtration rate between 30-59 mL/min/1.73 square meter 07/06/2013  . TIA (transient ischemic attack) 07/06/2013  . Chest pain 12/01/2012  . Fatigue 12/01/2012  . Right carotid bruit 12/01/2012  . Abdominal aneurysm without mention of rupture 10/22/2012  . Compulsive tobacco user syndrome 10/16/2012  . Basal cell carcinoma of skin 10/10/2012  . History of lung cancer 03/11/2012  . Lung nodule, multiple 03/11/2012  . PAD (peripheral artery disease) ([place]) 12/16/2011  . At risk for coronary artery disease 12/16/2011  . AAA (abdominal aortic aneurysm) ([place]) 09/27/2011  . Squamous cell lung cancer ([place]) 11/08/2010  . OTHER NONSPECIFIC ABNORMAL FINDING OF LUNG FIELD 10/31/2010  . GAIT IMBALANCE 03/02/2009  . FASTING HYPERGLYCEMIA 03/02/2009  . COLONIC POLYPS, HX OF 03/02/2009  . Tobacco use disorder 01/15/2008  . OBSTRUCTIVE CHRONIC BRONCHITIS WITHOUT EXACERBAT 01/15/2008  . HYPERPLASIA PROSTATE UNS W/O UR OBST & OTH LUTS 01/15/2008  . HYPERLIPIDEMIA 09/29/2007  . Peripheral vascular disease ([place]) 09/29/2007  . LEUKOPLAKIA, VOCAL CORDS 09/29/2007  . FEMORAL BRUIT 09/29/2007    Current Outpatient Prescriptions on File Prior to Visit  Medication Sig Dispense Refill  . bimatoprost (LUMIGAN) 0.01 % SOLN Place 1 drop into the left eye at bedtime.    . Biotin 5000 MCG TABS Take  5,000 mcg by mouth daily.    . bisacodyl (DULCOLAX) 5 MG EC tablet Take 5 mg by mouth daily as needed for moderate constipation.    . brimonidine (ALPHAGAN P) 0.1 % SOLN Place 3 drops into the left eye 3 (three) times daily.     [name] CARDURA 4 MG tablet TAKE (1/2) TABLET DAILY. 45 tablet 0  . cilostazol (PLETAL) 100 MG tablet TAKE 1 TABLET TWICE DAILY. 180 tablet 0  . diltiazem (CARDIZEM CD) 120 MG 24 hr capsule TAKE 1 CAPSULE DAILY. 30 capsule 11  . diltiazem (CARDIZEM)  30 MG tablet Take 30 mg by mouth every 4 (four) hours as needed (HR >100 and BP >100). Reported on 09/14/2015    . dorzolamide-timolol (COSOPT) 22.3-6.8 MG/ML ophthalmic solution Place 1 drop into the left eye 3 (three) times daily.     [name] dronedarone (MULTAQ) 400 MG tablet Take 400 mg by mouth 2 (two) times daily with a meal.    . ELIQUIS 5 MG TABS tablet TAKE 1 TABLET TWICE DAILY. (Patient taking differently: TAKE 1 TABLET BY MOUTH TWICE DAILY) 180 tablet 3  . FLUoxetine (PROZAC) 20 MG capsule TAKE (1) CAPSULE DAILY. 90 capsule 0  . Ginkgo Biloba 40 MG TABS Take 40 mg by mouth daily.     . iron polysaccharides (POLY-IRON 150) 150 MG capsule Take 150 mg by mouth daily.    . Melatonin 5 MG TABS Take 5 mg by mouth at bedtime.    . Multiple Vitamin (MULTIVITAMIN WITH MINERALS) TABS tablet Take 1 tablet by mouth daily. Centrum Silver    . naproxen sodium (ALEVE) 220 MG tablet Take 220 mg by mouth daily as needed (pain).    . ranitidine (ZANTAC) 150 MG tablet Take 150 mg by mouth daily as needed for heartburn.     . rosuvastatin (CRESTOR) 40 MG tablet TAKE 1 TABLET ONCE DAILY. (Patient taking differently: TAKE 40 MG BY MOUTH ONCE DAILY AT BEDTIME) 90 tablet 3   No current facility-administered medications on file prior to visit.    Past Medical History  Diagnosis Date  . Hypertension   . Hyperlipidemia   . Glaucoma BOTH EYES    Dr Gershon Crane  . Hx of adenomatous colonic polyps 2005     X 2; 1 hyperplastic polyp; Dr Olevia Perches  . Impaired fasting glucose 2007    108; A1c5.4%  . History of lung cancer APRIL 2012  SQUAMOUS CELL---- S/P RIGHT LOWER LOBECTOMY AT [facility] --  NO CHEMORADIATION---  NO RECURRENCE     ONCOLOGIST- DR Tressie Stalker  [facility]OV [facility] 10-27-2012  . GERD (gastroesophageal reflux disease)   . BPH (benign prostatic hypertrophy)   . Frequency of urination   . Urgency of urination   . Nocturia   . Lesion of bladder   . Microhematuria   . Arthritis   . History of basal cell carcinoma  excision   . Exertional dyspnea   . Peripheral vascular disease ([place]) S/P ANGIOPLASTY AND STENTING    FOLLOWED  BY DR [name]  . PAD (peripheral artery disease) ([place]) ABI'S  JAN 2014  0.65 ON RIGHT ;  1.04 ON LEFT  . AAA (abdominal aortic aneurysm) ([facility]) LAST ABD. [name]  JAN 2014  3.4CM    MONITORED BY DR [name]  . Bilateral carotid artery stenosis DUPLEX 12-29-2012  BY DR [facility]    BILATERAL ICA STENOSIS 60-79%  . COPD (chronic obstructive pulmonary disease) ([place])   . Stroke [facility]) Jul 07, 2013  mini  TIA  . Cancer ([place]) 01-25-12    LUNG  . Atrial fibrillation ([place])   . Spinal stenosis Sept. 2015  . OSA (obstructive sleep apnea) 08/29/2015    Mild with AHI overall 6/hr but severe during REM sleep with AHI 30/hr with oxygen saturations < 88% for 5.5 minutes of total sleep time.    . Complication of anesthesia     had nausea after carotid artery surgery, states the medicine given to help the nausea, made it worse    Past Surgical History  Procedure Laterality Date  . Angio plasty      X 4 in legs  . Colon polyectomy    . Trabecular surgery      OS  . Lung lobectomy  01/24/2011     RIGHT UPPER LOBE  (SQUAMOUS CELL CARCINOMA) Dr Dorthea Cove , [facility]. No chemotherapyor radiation  . Laryngoscopy  06-27-2004    BX VOCAL CORD  (LEUKOPLAKIA)  PER PT NO ISSUES SINCE  . Cataract extraction w/ intraocular lens  implant, bilateral    . Basal cell carcinoma excision      MULTIPLE TIMES--  RIGHT FOREARM, CHEEKS, AND BACK  . Femoral-popliteal bypass graft Left 1994  [facility]    AND 2001  IN [place]  . Cardiovascular stress test  12-08-2012  DR [facility]    NORMAL LEXISCAN WITH NO EXERCISE NUCLEAR STUDY/ EF 66%/   NO ISCHEMIA/ NO SIGNIFICANT CHANGE FROM PRIOR STUDY  . Transthoracic echocardiogram  12-29-2012  DR [facility]    MILD LVH/  LVSF NORMAL/ EF 16-10%/  GRADE I DIASTOLIC DYSFUNCTION  . Aortogram  07-27-2002    MILD DIFFUSE ILIAC ARTERY OCCLUSIVE DISEASE /  LEFT RENAL ARTERY 20%/ PATENT LEFT  FEM-POP GRAFT/ MILD SFA AND POPLITEAL ARTERY OCCLUSIVE DISEASE W/ SEVERE KIDNEY OCCLUSIVE DISEASE  . Cystoscopy w/ retrogrades Bilateral 01/21/2013    Procedure: CYSTOSCOPY WITH RETROGRADE PYELOGRAM;  Surgeon: Molli Hazard, MD;  [facility];  Service: Urology;  Laterality: Bilateral;   CYSTO, BLADDER BIOPSY, BILATERAL RETROGRADE PYELOGRAM  RAD TECH FROM RADIOLOGY PER JOY  . Cystoscopy with biopsy N/A 01/21/2013    Procedure: CYSTOSCOPY WITH BIOPSY;  Surgeon: Molli Hazard, MD;  Location: [facility];  Service: Urology;  Laterality: N/A;  . Endarterectomy Right 07/14/2013    Procedure: ENDARTERECTOMY CAROTID;  Surgeon: Angelia Mould, MD;  Location: [place];  Service: Vascular;  Laterality: Right;  . Patch angioplasty Right 07/14/2013    Procedure: PATCH ANGIOPLASTY;  Surgeon: Angelia Mould, MD;  Location: [facility] OR;  Service: Vascular;  Laterality: Right;  . Carotid endarterectomy Right 07-14-13    cea  . Carotid angiogram N/A 07/10/2013    Procedure: CAROTID ANGIOGRAM;  Surgeon: Elam Dutch, MD;  Location: [facility];  Service: Cardiovascular;  Laterality: N/A;  . Ep implantable device N/A 08/12/2015    Procedure: Loop Recorder Insertion;  Surgeon: Thompson Grayer, MD;  Location: [place] CV LAB;  Service: Cardiovascular;  Laterality: N/A;  . Peripheral vascular catheterization N/A 08/29/2015    Procedure: Abdominal Aortogram;  Surgeon: Angelia Mould, MD;  Location: [name] CV LAB;  Service: Cardiovascular;  Laterality: N/A;  . Lower extremity angiogram Bilateral 08/29/2015    Procedure: Lower Extremity Angiogram;  Surgeon: Angelia Mould, MD;  Location: [place] CV LAB;  Service: Cardiovascular;  Laterality: Bilateral;  . Femoral-popliteal bypass graft Left 08/30/2015    Procedure: REVISION OF BYPASS GRAFT Left  FEMORAL-POPLITEAL ARTERY;  Surgeon:  Angelia Mould, MD;  Location: [place];  Service:  Vascular;  Laterality: Left;  . Patch angioplasty Left 08/30/2015    Procedure: VEIN PATCH ANGIOPLASTY OF PROXIMAL Left BYPASS GRAFT;  Surgeon: Angelia Mould, MD;  Location: [place];  Service: Vascular;  Laterality: Left;    Social History   Social History  . Marital Status: Married    Spouse Name: [name]   . Number of Children: 3  . Years of Education: 12+   Social History Main Topics  . Smoking status: Former Smoker -- 0.50 packs/day for 50 years    Types: Cigarettes    Quit date: 07/28/2013  . Smokeless tobacco: Never Used     Comment: pt states that he is now taking chantix  . Alcohol Use: Yes     Comment:  socially, variable  . Drug Use: No  . Sexual Activity: Not on file   Other Topics Concern  . Not on file   Social History Narrative   Patient lives at home with spouse [name]   Patient has 3 children    Patient is right handed     Family History  Problem Relation Age of Onset  . Stroke Mother     mini strokes  . Alcohol abuse Father   . Heart disease Father     MI after 39  . Stroke Father   . Hypertension Father   . Heart attack Father   . Heart disease Paternal Aunt     several  . Hypertension Paternal Aunt     several  . Stroke Paternal Aunt     several  . Stroke Paternal Uncle     several  . Heart disease Paternal Uncle     several;2 had MI pre 61    Review of Systems  Constitutional: Negative for fever and chills.  Respiratory: Positive for shortness of breath. Negative for cough and wheezing.   Cardiovascular: Positive for leg swelling (left> right, past two days -- flew recently). Negative for chest pain and palpitations.  Gastrointestinal: Negative for abdominal pain.       No GERD  Musculoskeletal: Negative for back pain and arthralgias.  Neurological: Positive for dizziness (occasional - related to Afib). Negative for light-headedness.  Psychiatric/Behavioral: Negative for sleep disturbance and dysphoric mood. The patient is not  nervous/anxious.        Objective:   Filed Vitals:   11/28/15 1548  BP: 142/68  Pulse: 57  Temp: 97.8 F (36.6 C)  Resp: 18   Filed Weights   11/28/15 1548  Weight: 205 lb (92.987 kg)   Body mass index is 27.8 kg/(m^2).   Physical Exam Constitutional: Appears well-developed and well-nourished. No distress.  Neck: Neck supple. No tracheal deviation present. No thyromegaly present.  Right carotid bruit. No cervical adenopathy.   Cardiovascular: Normal rate, regular rhythm and normal heart sounds.   No murmur heard.  1+ edema left > right Pulmonary/Chest: Effort normal and breath sounds normal. No respiratory distress. No wheezes.        Assessment & Plan:   See Problem List for Assessment and Plan of chronic medical problems.  Follow up annually, continue routine follow up with specialists  prevnar given today

## 2015-11-28 NOTE — Patient Instructions (Signed)
  We have reviewed your prior records including labs and tests today.   All other Health Maintenance issues reviewed.   All recommended immunizations and age-appropriate screenings are up-to-date.  prevnar pneumonia vaccine administered today.   Medications reviewed and updated.  No changes recommended at this time.   Please followup annually for your physical

## 2015-11-28 NOTE — Assessment & Plan Note (Signed)
Saw urology for microscopic hematuria - w/u negative Was referred to nephrology

## 2015-11-30 LAB — CUP PACEART REMOTE DEVICE CHECK: MDC IDC SESS DTM: 20170110123935

## 2015-11-30 NOTE — Progress Notes (Signed)
Carelink summary report received. Battery status OK. Normal device function. No new symptom episodes, tachy episodes, brady, or pause episodes. 1 AF- 0% 2 minutes duration +Eliquis, see ECG. Monthly summary reports and ROV/PRN

## 2015-12-05 ENCOUNTER — Other Ambulatory Visit: Payer: Self-pay | Admitting: Internal Medicine

## 2015-12-05 NOTE — Telephone Encounter (Signed)
Last refill sent 08/30/15 by Dr Linna Darner. Okay to refill?

## 2015-12-09 ENCOUNTER — Encounter: Payer: Self-pay | Admitting: Vascular Surgery

## 2015-12-12 ENCOUNTER — Ambulatory Visit (INDEPENDENT_AMBULATORY_CARE_PROVIDER_SITE_OTHER): Payer: BLUE CROSS/BLUE SHIELD | Admitting: *Deleted

## 2015-12-12 DIAGNOSIS — I480 Paroxysmal atrial fibrillation: Secondary | ICD-10-CM

## 2015-12-12 NOTE — Progress Notes (Signed)
Carelink Summary Report / Loop Recorder 

## 2015-12-14 ENCOUNTER — Encounter: Payer: Self-pay | Admitting: Vascular Surgery

## 2015-12-14 ENCOUNTER — Ambulatory Visit (INDEPENDENT_AMBULATORY_CARE_PROVIDER_SITE_OTHER)
Admission: RE | Admit: 2015-12-14 | Discharge: 2015-12-14 | Disposition: A | Discharge status: Home / Self Care | Payer: BLUE CROSS/BLUE SHIELD | Source: Ambulatory Visit | Attending: Vascular Surgery | Admitting: Vascular Surgery

## 2015-12-14 ENCOUNTER — Ambulatory Visit (HOSPITAL_COMMUNITY)
Admission: RE | Admit: 2015-12-14 | Discharge: 2015-12-14 | Disposition: A | Discharge status: Home / Self Care | Payer: BLUE CROSS/BLUE SHIELD | Source: Ambulatory Visit | Attending: Vascular Surgery | Admitting: Vascular Surgery

## 2015-12-14 ENCOUNTER — Ambulatory Visit (INDEPENDENT_AMBULATORY_CARE_PROVIDER_SITE_OTHER): Payer: BLUE CROSS/BLUE SHIELD | Admitting: Vascular Surgery

## 2015-12-14 VITALS — BP 139/65 | HR 64 | Temp 97.7°F | Resp 16 | Ht 73.0 in | Wt 201.5 lb

## 2015-12-14 DIAGNOSIS — Z48812 Encounter for surgical aftercare following surgery on the circulatory system: Secondary | ICD-10-CM | POA: Diagnosis not present

## 2015-12-14 DIAGNOSIS — E785 Hyperlipidemia, unspecified: Secondary | ICD-10-CM | POA: Diagnosis not present

## 2015-12-14 DIAGNOSIS — R0989 Other specified symptoms and signs involving the circulatory and respiratory systems: Secondary | ICD-10-CM | POA: Insufficient documentation

## 2015-12-14 DIAGNOSIS — I10 Essential (primary) hypertension: Secondary | ICD-10-CM | POA: Insufficient documentation

## 2015-12-14 NOTE — Progress Notes (Signed)
[facility] [place]  Patient name: Troy Cuffe [name] MRN: [id number] DOB: 16-Jun-1941 Sex: male  REASON FOR VISIT: Follow up after revision of left femoropopliteal bypass graft.  HPI: Troy Guardia [name] is a 75 y.o. male who had undergone a left femoropopliteal bypass graft in [place]. He subsequently required revision of the distal anastomosis. He developed a recurrent stenosis in the proximal graft and therefore on 08/30/2015, underwent vein patch angioplasty of the proximal left femoropopliteal bypass graft using left great saphenous vein. I last saw him on 09/14/2015. At that time he was doing well and I ordered a follow up duplex of his graft in 3 months with ABIs. He comes in today for that visit.  Since I saw him last, he continues to have claudication in the right calf but no symptoms in the left leg. He has been exercising and walking pretty much every day and this has helped. He denies rest pain in the right foot. His claudication in the right calf has been fairly stable. He does not have thyroid claudication.  He is on a statin. He is not on aspirin as he takes Eliquis.    Past Medical History  Diagnosis Date  . Hypertension   . Hyperlipidemia   . Glaucoma BOTH EYES    Dr Gershon Crane  . Hx of adenomatous colonic polyps 2005     X 2; 1 hyperplastic polyp; Dr Olevia Perches  . Impaired fasting glucose 2007    108; A1c5.4%  . History of lung cancer APRIL 2012  SQUAMOUS CELL---- S/P RIGHT LOWER LOBECTOMY AT [facility] --  NO CHEMORADIATION---  NO RECURRENCE     ONCOLOGIST- DR Tressie Stalker  LOV IN [facility] 10-27-2012  . GERD (gastroesophageal reflux disease)   . BPH (benign prostatic hypertrophy)   . Frequency of urination   . Urgency of urination   . Nocturia   . Lesion of bladder   . Microhematuria   . Arthritis   . History of basal cell carcinoma excision   . Exertional dyspnea   . Peripheral vascular disease ([place]) S/P ANGIOPLASTY AND STENTING    FOLLOWED  BY DR [name]  . PAD  (peripheral artery disease) ([place]) ABI'S  JAN 2014  0.65 ON RIGHT ;  1.04 ON LEFT  . AAA (abdominal aortic aneurysm) ([facility]) LAST ABD. [name]  JAN 2014  3.4CM    MONITORED BY DR [name]  . Bilateral carotid artery stenosis DUPLEX 12-29-2012  BY DR [facility]    BILATERAL ICA STENOSIS 60-79%  . COPD (chronic obstructive pulmonary disease) ([place])   . Stroke [facility]) Jul 07, 2013    mini  TIA  . Cancer ([place]) 01-25-12    LUNG  . Atrial fibrillation ([place])   . Spinal stenosis Sept. 2015  . OSA (obstructive sleep apnea) 08/29/2015    Mild with AHI overall 6/hr but severe during REM sleep with AHI 30/hr with oxygen saturations < 88% for 5.5 minutes of total sleep time.    . Complication of anesthesia     had nausea after carotid artery surgery, states the medicine given to help the nausea, made it worse    Family History  Problem Relation Age of Onset  . Stroke Mother     mini strokes  . Alcohol abuse Father   . Heart disease Father     MI after 65  . Stroke Father   . Hypertension Father   . Heart attack Father   . Heart disease Paternal Aunt  several  . Hypertension Paternal Aunt     several  . Stroke Paternal Aunt     several  . Stroke Paternal Uncle     several  . Heart disease Paternal Uncle     several;2 had MI pre 37    SOCIAL HISTORY: Social History  Substance Use Topics  . Smoking status: Former Smoker -- 0.50 packs/day for 50 years    Types: Cigarettes    Quit date: 07/28/2013  . Smokeless tobacco: Never Used  . Alcohol Use: Yes     Comment:  socially, variable    Allergies  Allergen Reactions  . Penicillins Hives, Shortness Of Breath and Other (See Comments)    Flushing  & dyspnea Because of a history of documented adverse serious drug reaction;Medi Alert bracelet  is recommended PCN reaction causing immediate rash, facial/tongue/throat swelling, SOB or lightheadedness with hypotension: Yes PCN reaction causing severe rash involving mucus membranes or skin necrosis:  No PCN reaction occurring within the last 10 years: NO PCN reaction that required hospitalization: NO  . Sulfonamide Derivatives Hives, Shortness Of Breath and Other (See Comments)    Flushing & dyspnea Because of a history of documented adverse serious drug reaction;Medi Alert bracelet  is recommended  . Niacin-Lovastatin Er Other (See Comments)    Dyspnea, flushing  . Atorvastatin Other (See Comments)    Myalgias & athralgias- takes Crestor    Current Outpatient Prescriptions  Medication Sig Dispense Refill  . bimatoprost (LUMIGAN) 0.01 % SOLN Place 1 drop into the left eye at bedtime.    . Biotin 5000 MCG TABS Take 5,000 mcg by mouth daily.    . bisacodyl (DULCOLAX) 5 MG EC tablet Take 5 mg by mouth daily as needed for moderate constipation.    . brimonidine (ALPHAGAN P) 0.1 % SOLN Place 3 drops into the left eye 3 (three) times daily.     [name] CARDURA 4 MG tablet TAKE (1/2) TABLET DAILY. 45 tablet 0  . cilostazol (PLETAL) 100 MG tablet TAKE 1 TABLET TWICE DAILY. 180 tablet 0  . diltiazem (CARDIZEM CD) 120 MG 24 hr capsule TAKE 1 CAPSULE DAILY. 30 capsule 11  . diltiazem (CARDIZEM) 30 MG tablet Take 30 mg by mouth every 4 (four) hours as needed (HR >100 and BP >100). Reported on 09/14/2015    . dorzolamide-timolol (COSOPT) 22.3-6.8 MG/ML ophthalmic solution Place 1 drop into the left eye 3 (three) times daily.     [name] dronedarone (MULTAQ) 400 MG tablet Take 400 mg by mouth 2 (two) times daily with a meal.    . ELIQUIS 5 MG TABS tablet TAKE 1 TABLET TWICE DAILY. (Patient taking differently: TAKE 1 TABLET BY MOUTH TWICE DAILY) 180 tablet 3  . FLUoxetine (PROZAC) 20 MG capsule TAKE (1) CAPSULE DAILY. 90 capsule 0  . Ginkgo Biloba 40 MG TABS Take 40 mg by mouth daily.     . iron polysaccharides (POLY-IRON 150) 150 MG capsule Take 150 mg by mouth daily.    . Melatonin 5 MG TABS Take 5 mg by mouth at bedtime.    . Multiple Vitamin (MULTIVITAMIN WITH MINERALS) TABS tablet Take 1 tablet by mouth  daily. Centrum Silver    . naproxen sodium (ALEVE) 220 MG tablet Take 220 mg by mouth daily as needed (pain).    . ranitidine (ZANTAC) 150 MG tablet Take 150 mg by mouth daily as needed for heartburn.     . rosuvastatin (CRESTOR) 40 MG tablet TAKE 1 TABLET ONCE DAILY. (Patient  taking differently: TAKE 40 MG BY MOUTH ONCE DAILY AT BEDTIME) 90 tablet 3   No current facility-administered medications for this visit.    REVIEW OF SYSTEMS:  '[X]'$  denotes positive finding, '[ ]'$  denotes negative finding Cardiac  Comments:  Chest pain or chest pressure:    Shortness of breath upon exertion: X   Short of breath when lying flat:    Irregular heart rhythm:        Vascular    Pain in calf, thigh, or hip brought on by ambulation: X   Pain in feet at night that wakes you up from your sleep:     Blood clot in your veins:    Leg swelling:         Pulmonary    Oxygen at home:    Productive cough:     Wheezing:         Neurologic    Sudden weakness in arms or legs:     Sudden numbness in arms or legs:     Sudden onset of difficulty speaking or slurred speech:    Temporary loss of vision in one eye:     Problems with dizziness:         Gastrointestinal    Blood in stool:     Vomited blood:         Genitourinary    Burning when urinating:     Blood in urine:        Psychiatric    Major depression:         Hematologic    Bleeding problems:    Problems with blood clotting too easily:        Skin    Rashes or ulcers:        Constitutional    Fever or chills:      PHYSICAL EXAM: There were no vitals filed for this visit.  GENERAL: The patient is a well-nourished male, in no acute distress. The vital signs are documented above. CARDIAC: There is a regular rate and rhythm.  VASCULAR: I do not detect carotid bruits. He has palpable femoral pulses and palpable left dorsalis pedis and left posterior tibial pulse. I cannot palpate pulses in the right foot. He has no significant lower  extremity swelling. PULMONARY: There is good air exchange bilaterally without wheezing or rales. ABDOMEN: Soft and non-tender with normal pitched bowel sounds.  MUSCULOSKELETAL: There are no major deformities or cyanosis. NEUROLOGIC: No focal weakness or paresthesias are detected. SKIN: There are no ulcers or rashes noted. PSYCHIATRIC: The patient has a normal affect.  DATA:   ARTERIAL DOPPLER: I have independently interpreted his arterial Doppler study today.  On the left side, there is a triphasic posterior tibial signal and triphasic dorsalis pedis signal. ABI is 100%.  On the right side, there is a monophasic dorsalis pedis signal and a monophasic posterior tibial signal. ABI is 63%.  ARTERIAL DUPLEX: I have independently interpreted his arterial duplex. This shows that his graft is patent with some mild diffuse disease throughout but no focal stenosis. The graft around the knee is somewhat tortuous and ectatic.  His most recent carotid duplex scan was in October 2015. At that time he had a widely patent right carotid endarterectomy site with no significant stenosis on the left.  MEDICAL ISSUES:  FOLLOW UP OF LEFT FEMOROPOPLITEAL BYPASS GRAFT: His bypass graft is widely patent. This graft was placed in [place] in 1994 he had 2 revisions of the graft. He has triphasic  signals in the left foot and the graft is widely patent. I have encouraged him to stay as active as possible. He is not a smoker. I think we can stretch his follow up out to 9 months and I have ordered ABIs and duplex at that time. He knows to call sooner for problems.  STATUS POST RIGHT CAROTID ENDARTERECTOMY: He is status post right carotid endarterectomy in October 2014. When he comes in for his 9 month follow up visit, we'll get a carotid duplex also. He is asymptomatic from that standpoint.  Deitra Mayo [facility] [place]: [phone]

## 2015-12-15 NOTE — Addendum Note (Signed)
Addended by: Thresa Ross C on: 12/15/2015 03:00 PM   Modules accepted: Orders

## 2015-12-20 ENCOUNTER — Encounter: Payer: Self-pay | Admitting: Cardiology

## 2015-12-30 ENCOUNTER — Ambulatory Visit (INDEPENDENT_AMBULATORY_CARE_PROVIDER_SITE_OTHER): Payer: BLUE CROSS/BLUE SHIELD | Admitting: Cardiology

## 2015-12-30 ENCOUNTER — Encounter: Payer: Self-pay | Admitting: Cardiology

## 2015-12-30 VITALS — BP 118/64 | HR 66 | Ht 73.0 in | Wt 210.4 lb

## 2015-12-30 DIAGNOSIS — I480 Paroxysmal atrial fibrillation: Secondary | ICD-10-CM | POA: Diagnosis not present

## 2015-12-30 DIAGNOSIS — I739 Peripheral vascular disease, unspecified: Secondary | ICD-10-CM | POA: Diagnosis not present

## 2015-12-30 DIAGNOSIS — G4733 Obstructive sleep apnea (adult) (pediatric): Secondary | ICD-10-CM

## 2015-12-30 DIAGNOSIS — E782 Mixed hyperlipidemia: Secondary | ICD-10-CM

## 2015-12-30 NOTE — Patient Instructions (Addendum)
Medication Instructions:  Your physician recommends that you continue on your current medications as directed. Please refer to the Current Medication list given to you today.   Labwork: Your physician recommends that you return for a FASTING lipid profile /BMET/CBCd --this is scheduled for Wednesday April 5,2017 DO NOT eat or drink after midnight the night before.  The lab is open from 7:30AM-5PM.   Testing/Procedures: none  Follow-Up: Your physician recommends that you schedule a follow-up appointment in: September 2017.   Any Other Special Instructions Will Be Listed Below (If Applicable).     If you need a refill on your cardiac medications before your next appointment, please call your pharmacy.

## 2016-01-02 NOTE — Progress Notes (Signed)
Patient ID: Troy Kenner [name], male   DOB: 14-Nov-1940, 75 y.o.   MRN: [id number] PCP: Dr. Linna Darner  75 yo with history of HTN, COPD, active smoking/COPD, carotid stenosis s/p right CEA, and PAD presents for cardiology followup.  He does not have known CAD but is at high risk for CAD based on his comorbidities.  Lexiscan Cardiolite in 3/14 showed no ischemia or infarction and echo in 3/14 showed normal EF.  He had a left fem-pop bypass at the [facility] in the '90s.  He is followed at [facility] for PAD, last ABIs in 3/16 showed mild disease on the left and moderate disease on the right (unchanged). He has chronic right calf/thigh/buttocks claudication that is unchanged over the last few years and follows regularly at [facility].  He has had right lower lobectomy for lung cancer.  He continues to stay off cigarettes.    At a prior appointment, he was in atrial fibrillation but did not realize it.  I started him on Eliquis and diltiazem CD, and he spontaneously converted to NSR.  In 5/15, he was at [facility] and felt "strange" one day: fatigued, weak, short of breath.  He went to the ER and was in atrial fibrillation with HR in 80s-90s.  He spontaneously converted to NSR in the ER.  He felt back to normal after converting to NSR.  I started him on Multaq 400 mg bid.    Given generalized fatigue, he stopped ramipril.  This led to considerable improvement in his fatigue. Using CPAP has also improved his fatigue.  He denies dyspnea on flat ground.  He is short of breath walking up hills.  No chest pain.  He does not have claudication walking on flat ground but does have pain in the right calf with walking 1-2 blocks up an incline.  He had patch angioplasty revision of left fem-pop bypass in 11/16.  He exercises with a trainer twice a week.  No syncope or lightheadedness. He has not felt atrial fibrillation.    Labs (3/13): LDL 75, HDL 35, K 4.1, creatinine 1.1 Labs (3/14): LDL 91, HDL 27 Labs (10/14): K 4.3, creatinine  0.98 Labs (4/15): K 4.9, creatinine 1.1, LDL 76, HDL 30 Labs (5/15): K 4.3, creatinine 1.7, BNP 251 Labs (6/15): K 4.4, creatinine 1.3 Labs (10/15): TSH normal Labs (5/16): K 4.2, creatinine 1.12, HCT 39.5, LFTs normal, LDL 84, HDL 43, TSH normal Labs (11/16): creatinine 0.94  ECG: NSR, PACs  PMH: 1. HTN: Fatigue and cough with ramipril use.  2. COPD: Quit smoking 2014.  3. AAA: CT 1/13 with 3.0 cm AAA.  Abdominal US (1/14) with 3.4 cm AAA. Abdominal US (4/15) with 3.25 x 3.27 AAA. Abdominal US (3/16) with 3.7 cm AAA.   4. Squamous cell lung cancer diagnosed 2/12.  Had right lower lobectomy in 4/12.  5. Hyperlipidemia 6. PAD: Left fem-pop bypass 1994.  ABIs (2/12) 0.62 on right, 0.95 on left. ABIs (1/14): 0.65 on right, 1.04 on left. ABIs (4/15) 0.66 right, 0.87 left.  ABIs (3/16) 0.6 right, 0.88 left.  Patch angioplasty left fem-pop bypass in 11/16.   7. Stress myoview 2004 was normal. Lexiscan Cardiolite (3/14) with EF 66%, no ischemia or infarction.  8. Echo (3/14): technically difficult with EF 55-60%, upper normal RV size.  Echo (5/16) with EF 60-65%.   9. Carotid stenosis: TIA 10/14.  Carotid dopplers with > 19% RICA, 50-93% LICA.  Patient had right CEA in 10/14. Carotids (4/15) patent right CEA, LICA  40-59% stenosis.  Carotids (16/07) < 37% LICA, patent right CEA.  10. Atrial fibrillation: Paroxysmal. First noted after lobectomy in 4/12 (brief), recurrence in 4/15 then in 5/15.  11. Spinal stenosis 12. OSA: Using CPAP.  33. Glaucoma  SH: Married, lives in [place]  [place].  Quit smoking in 2014.  Rare ETOH now.   FH: No premature CAD  ROS: All systems reviewed and negative except as per HPI.   Current Outpatient Prescriptions  Medication Sig Dispense Refill  . bimatoprost (LUMIGAN) 0.01 % SOLN Place 1 drop into the left eye at bedtime.    . Biotin 5000 MCG TABS Take 5,000 mcg by mouth daily.    . bisacodyl (DULCOLAX) 5 MG EC tablet Take 5 mg by mouth  daily as needed for moderate constipation.    . brimonidine (ALPHAGAN P) 0.1 % SOLN Place 3 drops into the left eye 3 (three) times daily.     [name] CARDURA 4 MG tablet TAKE (1/2) TABLET DAILY. 45 tablet 0  . cilostazol (PLETAL) 100 MG tablet TAKE 1 TABLET TWICE DAILY. 180 tablet 0  . diltiazem (CARDIZEM CD) 120 MG 24 hr capsule TAKE 1 CAPSULE DAILY. 30 capsule 11  . diltiazem (CARDIZEM) 30 MG tablet Take 30 mg by mouth 4 (four) times daily. TAKE 1 TABLET ('30mg'$ ) BY MOUTH EVERY 4 HOURS AS NEEDED (HR>100 AND BP >100)    . dorzolamide-timolol (COSOPT) 22.3-6.8 MG/ML ophthalmic solution Place 1 drop into the left eye 3 (three) times daily.     [name] dronedarone (MULTAQ) 400 MG tablet Take 400 mg by mouth 2 (two) times daily with a meal.    . ELIQUIS 5 MG TABS tablet TAKE 1 TABLET TWICE DAILY. (Patient taking differently: TAKE 1 TABLET BY MOUTH TWICE DAILY) 180 tablet 3  . FLUoxetine (PROZAC) 20 MG capsule TAKE (1) CAPSULE DAILY. 90 capsule 0  . Ginkgo Biloba 40 MG TABS Take 40 mg by mouth daily.     . iron polysaccharides (POLY-IRON 150) 150 MG capsule Take 150 mg by mouth daily.    . Melatonin 5 MG TABS Take 5 mg by mouth at bedtime.    . Multiple Vitamin (MULTIVITAMIN WITH MINERALS) TABS tablet Take 1 tablet by mouth daily. Centrum Silver    . naproxen sodium (ALEVE) 220 MG tablet Take 220 mg by mouth daily as needed (pain).    . ranitidine (ZANTAC) 150 MG tablet Take 150 mg by mouth daily as needed for heartburn.     . rosuvastatin (CRESTOR) 40 MG tablet TAKE 1 TABLET ONCE DAILY. (Patient taking differently: TAKE 40 MG BY MOUTH ONCE DAILY AT BEDTIME) 90 tablet 3   No current facility-administered medications for this visit.    BP 118/64 mmHg  Pulse 66  Ht '6\' 1"'$  (1.854 m)  Wt 210 lb 6.4 oz (95.437 kg)  BMI 27.76 kg/m2 General: NAD Neck: JVP 7 cm, no thyromegaly or thyroid nodule.  Lungs: Mildly decreased breath sounds right base.  CV: Nondisplaced PMI.  Heart regular S1/S2, no S3/S4, 1/6 SEM RUSB.   1+ ankle edema bilaterally.  Bilateral carotid bruits. Difficult to palpate PT pulses.   Abdomen: Soft, nontender, no hepatosplenomegaly, no distention.   Neurologic: Alert and oriented x 3.  Psych: Normal affect. Extremities: No clubbing or cyanosis.   Assessment/Plan: 1. Chest pain: He has had no further chest pain.  Lexiscan Cardiolite was negative in 3/14 and EF was preserved on echo.   2. Hyperlipidemia: Continue Crestor.  Check lipids  today.    3. Carotid stenosis: s/p R CEA.  Will follow with [facility] for residual LICA stenosis.  4. PAD: s/p patch angioplasty to left fem-pop bypass in 11/16.  Still with claudication in right calf.  Followed at [facility]. He is on cilostazol. He knows to try to walk through the pain for at least a short distance.   5. AAA: Stable on recent US.    6. COPD: Dyspnea improved now that he has stopped smoking.   7. Atrial fibrillation: Patient remains in NSR on dronedarone today.  CHADSVASC 3 (age, vascular disease, HTN).  He is also on diltiazem CD 120 mg daily.   - Limit ETOH.  - Use CPAP.  8. CKD: BMET today.   9. Fatigue: Much improved off ramipril and with use of CPAP.    Followup in 6 months.    [name] 01/02/2016

## 2016-01-04 ENCOUNTER — Other Ambulatory Visit (INDEPENDENT_AMBULATORY_CARE_PROVIDER_SITE_OTHER): Payer: BLUE CROSS/BLUE SHIELD | Admitting: *Deleted

## 2016-01-04 ENCOUNTER — Other Ambulatory Visit: Payer: Self-pay | Admitting: Urology

## 2016-01-04 DIAGNOSIS — N281 Cyst of kidney, acquired: Secondary | ICD-10-CM

## 2016-01-04 DIAGNOSIS — R3129 Other microscopic hematuria: Secondary | ICD-10-CM

## 2016-01-04 DIAGNOSIS — I480 Paroxysmal atrial fibrillation: Secondary | ICD-10-CM

## 2016-01-04 DIAGNOSIS — E782 Mixed hyperlipidemia: Secondary | ICD-10-CM

## 2016-01-04 LAB — CBC WITH DIFFERENTIAL/PLATELET
BASOS ABS: 66 {cells}/uL (ref 0–200)
BASOS PCT: 1 %
EOS ABS: 132 {cells}/uL (ref 15–500)
Eosinophils Relative: 2 %
HEMATOCRIT: 38.4 % — AB (ref 38.5–50.0)
Hemoglobin: 12.6 g/dL — ABNORMAL LOW (ref 13.2–17.1)
LYMPHS PCT: 16 %
Lymphs Abs: 1056 cells/uL (ref 850–3900)
MCH: 27.7 pg (ref 27.0–33.0)
MCHC: 32.8 g/dL (ref 32.0–36.0)
MCV: 84.4 fL (ref 80.0–100.0)
MONO ABS: 660 {cells}/uL (ref 200–950)
MONOS PCT: 10 %
MPV: 9.3 fL (ref 7.5–12.5)
NEUTROS PCT: 71 %
Neutro Abs: 4686 cells/uL (ref 1500–7800)
Platelets: 284 10*3/uL (ref 140–400)
RBC: 4.55 MIL/uL (ref 4.20–5.80)
RDW: 14.5 % (ref 11.0–15.0)
WBC: 6.6 10*3/uL (ref 3.8–10.8)

## 2016-01-04 LAB — BASIC METABOLIC PANEL
BUN: 15 mg/dL (ref 7–25)
CALCIUM: 9.3 mg/dL (ref 8.6–10.3)
CHLORIDE: 105 mmol/L (ref 98–110)
CO2: 25 mmol/L (ref 20–31)
CREATININE: 1.42 mg/dL — AB (ref 0.70–1.18)
Glucose, Bld: 118 mg/dL — ABNORMAL HIGH (ref 65–99)
Potassium: 4.3 mmol/L (ref 3.5–5.3)
Sodium: 138 mmol/L (ref 135–146)

## 2016-01-04 LAB — LIPID PANEL
CHOL/HDL RATIO: 2.4 ratio (ref <=5.0)
CHOLESTEROL: 95 mg/dL — AB (ref 125–200)
HDL: 39 mg/dL — AB (ref >=40)
LDL Cholesterol: 39 mg/dL (ref <130)
TRIGLYCERIDES: 86 mg/dL (ref <150)
VLDL: 17 mg/dL (ref <30)

## 2016-01-04 NOTE — Addendum Note (Signed)
Addended by: Eulis Foster on: 01/04/2016 09:32 AM   Modules accepted: Orders

## 2016-01-06 ENCOUNTER — Ambulatory Visit (HOSPITAL_COMMUNITY)
Admission: RE | Admit: 2016-01-06 | Discharge: 2016-01-06 | Disposition: A | Discharge status: Home / Self Care | Payer: BLUE CROSS/BLUE SHIELD | Source: Ambulatory Visit | Attending: Urology | Admitting: Urology

## 2016-01-06 DIAGNOSIS — N281 Cyst of kidney, acquired: Secondary | ICD-10-CM

## 2016-01-06 DIAGNOSIS — R3129 Other microscopic hematuria: Secondary | ICD-10-CM | POA: Diagnosis not present

## 2016-01-06 DIAGNOSIS — N400 Enlarged prostate without lower urinary tract symptoms: Secondary | ICD-10-CM | POA: Diagnosis not present

## 2016-01-09 ENCOUNTER — Ambulatory Visit (INDEPENDENT_AMBULATORY_CARE_PROVIDER_SITE_OTHER): Payer: BLUE CROSS/BLUE SHIELD | Admitting: *Deleted

## 2016-01-09 DIAGNOSIS — I480 Paroxysmal atrial fibrillation: Secondary | ICD-10-CM

## 2016-01-09 NOTE — Progress Notes (Signed)
Carelink Summary Report / Loop Recorder 

## 2016-01-19 ENCOUNTER — Encounter: Payer: Self-pay | Admitting: Cardiology

## 2016-01-19 ENCOUNTER — Ambulatory Visit (INDEPENDENT_AMBULATORY_CARE_PROVIDER_SITE_OTHER): Payer: BLUE CROSS/BLUE SHIELD | Admitting: Cardiology

## 2016-01-19 VITALS — BP 126/64 | HR 84 | Ht 73.0 in | Wt 204.1 lb

## 2016-01-19 DIAGNOSIS — G4733 Obstructive sleep apnea (adult) (pediatric): Secondary | ICD-10-CM | POA: Diagnosis not present

## 2016-01-19 DIAGNOSIS — I10 Essential (primary) hypertension: Secondary | ICD-10-CM

## 2016-01-19 DIAGNOSIS — I480 Paroxysmal atrial fibrillation: Secondary | ICD-10-CM

## 2016-01-19 NOTE — Patient Instructions (Signed)

## 2016-01-19 NOTE — Progress Notes (Signed)
Cardiology Office Note    Date:  01/19/2016   ID:  Troy Kenner [name], DOB [birth year], MRN [id number]  PCP:  Binnie Rail, MD  Cardiologist:  Sueanne Margarita, MD   Chief Complaint  Patient presents with  . Sleep Apnea  . Hypertension    History of Present Illness:  Troy Verdell [name] is a 75 y.o. male with a history of HTN, dyslipdiemia and atrial fibrillation who was recently referred for sleep study due to complaints of snoring, excessive daytime sleepiness and underwent PSG showing mild OSA with an AHI of 6/hr and oxygen desaturations as low as 84% with moderate snoring.  He underwent CPAP titration to 10cm H2O.  He is doing well with his device.  He tolerates the mask and feels the pressure is adequate.  Since going on CPAP he feels more rested in the am and has less daytime sleepiness.  He no longer snores.  He uses a nasal pillow mask and has some mild mouth dryness.    Past Medical History  Diagnosis Date  . Hypertension   . Hyperlipidemia   . Glaucoma BOTH EYES    Dr Gershon Crane  . Hx of adenomatous colonic polyps 2005     X 2; 1 hyperplastic polyp; Dr Olevia Perches  . Impaired fasting glucose 2007    108; A1c5.4%  . History of lung cancer APRIL 2012  SQUAMOUS CELL---- S/P RIGHT LOWER LOBECTOMY AT [facility] --  NO CHEMORADIATION---  NO RECURRENCE     ONCOLOGIST- DR Tressie Stalker  LOV IN [facility] 10-27-2012  . GERD (gastroesophageal reflux disease)   . BPH (benign prostatic hypertrophy)   . Frequency of urination   . Urgency of urination   . Nocturia   . Lesion of bladder   . Microhematuria   . Arthritis   . History of basal cell carcinoma excision   . Exertional dyspnea   . Peripheral vascular disease ([place]) S/P ANGIOPLASTY AND STENTING    FOLLOWED  BY DR [name]  . PAD (peripheral artery disease) ([place]) ABI'S  JAN 2014  0.65 ON RIGHT ;  1.04 ON LEFT  . AAA (abdominal aortic aneurysm) ([facility]) LAST ABD. [name]  JAN 2014  3.4CM    MONITORED BY DR [name]  . Bilateral carotid artery stenosis DUPLEX  12-29-2012  BY DR [facility]    BILATERAL ICA STENOSIS 60-79%  . COPD (chronic obstructive pulmonary disease) ([place])   . Stroke [facility]) Jul 07, 2013    mini  TIA  . Cancer ([place]) 01-25-12    LUNG  . Atrial fibrillation ([place])   . Spinal stenosis Sept. 2015  . OSA (obstructive sleep apnea) 08/29/2015    Mild with AHI overall 6/hr but severe during REM sleep with AHI 30/hr with oxygen saturations < 88% for 5.5 minutes of total sleep time.    . Complication of anesthesia     had nausea after carotid artery surgery, states the medicine given to help the nausea, made it worse  . Benign essential HTN 01/19/2016    Past Surgical History  Procedure Laterality Date  . Angio plasty      X 4 in legs  . Colon polyectomy    . Trabecular surgery      OS  . Lung lobectomy  01/24/2011     RIGHT UPPER LOBE  (SQUAMOUS CELL CARCINOMA) Dr Dorthea Cove , [facility]. No chemotherapyor radiation  . Laryngoscopy  06-27-2004    BX VOCAL CORD  (LEUKOPLAKIA)  PER PT NO ISSUES SINCE  . Cataract  extraction w/ intraocular lens  implant, bilateral    . Basal cell carcinoma excision      MULTIPLE TIMES--  RIGHT FOREARM, CHEEKS, AND BACK  . Femoral-popliteal bypass graft Left 1994  [facility]    AND 2001  IN [place]  . Cardiovascular stress test  12-08-2012  DR [facility]    NORMAL LEXISCAN WITH NO EXERCISE NUCLEAR STUDY/ EF 66%/   NO ISCHEMIA/ NO SIGNIFICANT CHANGE FROM PRIOR STUDY  . Transthoracic echocardiogram  12-29-2012  DR [facility]    MILD LVH/  LVSF NORMAL/ EF 14-78%/  GRADE I DIASTOLIC DYSFUNCTION  . Aortogram  07-27-2002    MILD DIFFUSE ILIAC ARTERY OCCLUSIVE DISEASE /  LEFT RENAL ARTERY 20%/ PATENT LEFT FEM-POP GRAFT/ MILD SFA AND POPLITEAL ARTERY OCCLUSIVE DISEASE W/ SEVERE KIDNEY OCCLUSIVE DISEASE  . Cystoscopy w/ retrogrades Bilateral 01/21/2013    Procedure: CYSTOSCOPY WITH RETROGRADE PYELOGRAM;  Surgeon: Molli Hazard, MD;  Location: [facility];  Service: Urology;  Laterality: Bilateral;   CYSTO,  BLADDER BIOPSY, BILATERAL RETROGRADE PYELOGRAM  RAD TECH FROM RADIOLOGY PER JOY  . Cystoscopy with biopsy N/A 01/21/2013    Procedure: CYSTOSCOPY WITH BIOPSY;  Surgeon: Molli Hazard, MD;  Location: [facility];  Service: Urology;  Laterality: N/A;  . Endarterectomy Right 07/14/2013    Procedure: ENDARTERECTOMY CAROTID;  Surgeon: Angelia Mould, MD;  Location: [place];  Service: Vascular;  Laterality: Right;  . Patch angioplasty Right 07/14/2013    Procedure: PATCH ANGIOPLASTY;  Surgeon: Angelia Mould, MD;  Location: [facility] OR;  Service: Vascular;  Laterality: Right;  . Carotid endarterectomy Right 07-14-13    cea  . Carotid angiogram N/A 07/10/2013    Procedure: CAROTID ANGIOGRAM;  Surgeon: Elam Dutch, MD;  Location: [facility] CATH LAB;  Service: Cardiovascular;  Laterality: N/A;  . Ep implantable device N/A 08/12/2015    Procedure: Loop Recorder Insertion;  Surgeon: Thompson Grayer, MD;  Location: [place] CV LAB;  Service: Cardiovascular;  Laterality: N/A;  . Peripheral vascular catheterization N/A 08/29/2015    Procedure: Abdominal Aortogram;  Surgeon: Angelia Mould, MD;  Location: [place] CV LAB;  Service: Cardiovascular;  Laterality: N/A;  . Lower extremity angiogram Bilateral 08/29/2015    Procedure: Lower Extremity Angiogram;  Surgeon: Angelia Mould, MD;  Location: [place] CV LAB;  Service: Cardiovascular;  Laterality: Bilateral;  . Femoral-popliteal bypass graft Left 08/30/2015    Procedure: REVISION OF BYPASS GRAFT Left  FEMORAL-POPLITEAL ARTERY;  Surgeon: Angelia Mould, MD;  Location: [place];  Service: Vascular;  Laterality: Left;  . Patch angioplasty Left 08/30/2015    Procedure: VEIN PATCH ANGIOPLASTY OF PROXIMAL Left BYPASS GRAFT;  Surgeon: Angelia Mould, MD;  Location[facility] OR;  Service: Vascular;  Laterality: Left;    Current Medications: Outpatient Prescriptions Prior to Visit  Medication Sig Dispense Refill   . bimatoprost (LUMIGAN) 0.01 % SOLN Place 1 drop into the left eye at bedtime.    . Biotin 5000 MCG TABS Take 5,000 mcg by mouth daily.    . bisacodyl (DULCOLAX) 5 MG EC tablet Take 5 mg by mouth daily as needed for moderate constipation.    . brimonidine (ALPHAGAN P) 0.1 % SOLN Place 3 drops into the left eye 3 (three) times daily.     [name] CARDURA 4 MG tablet TAKE (1/2) TABLET DAILY. 45 tablet 0  . cilostazol (PLETAL) 100 MG tablet TAKE 1 TABLET TWICE DAILY. 180 tablet 0  . diltiazem (CARDIZEM CD)  120 MG 24 hr capsule TAKE 1 CAPSULE DAILY. 30 capsule 11  . diltiazem (CARDIZEM) 30 MG tablet Take 30 mg by mouth 4 (four) times daily. TAKE 1 TABLET ('30mg'$ ) BY MOUTH EVERY 4 HOURS AS NEEDED (HR>100 AND BP >100)    . dorzolamide-timolol (COSOPT) 22.3-6.8 MG/ML ophthalmic solution Place 1 drop into the left eye 3 (three) times daily.     [name] dronedarone (MULTAQ) 400 MG tablet Take 400 mg by mouth 2 (two) times daily with a meal.    . ELIQUIS 5 MG TABS tablet TAKE 1 TABLET TWICE DAILY. (Patient taking differently: TAKE 1 TABLET BY MOUTH TWICE DAILY) 180 tablet 3  . FLUoxetine (PROZAC) 20 MG capsule TAKE (1) CAPSULE DAILY. 90 capsule 0  . Ginkgo Biloba 40 MG TABS Take 40 mg by mouth daily.     . iron polysaccharides (POLY-IRON 150) 150 MG capsule Take 150 mg by mouth daily.    . Melatonin 5 MG TABS Take 5 mg by mouth at bedtime.    . Multiple Vitamin (MULTIVITAMIN WITH MINERALS) TABS tablet Take 1 tablet by mouth daily. Centrum Silver    . naproxen sodium (ALEVE) 220 MG tablet Take 220 mg by mouth daily as needed (pain).    . ranitidine (ZANTAC) 150 MG tablet Take 150 mg by mouth daily as needed for heartburn.     . rosuvastatin (CRESTOR) 40 MG tablet TAKE 1 TABLET ONCE DAILY. (Patient taking differently: TAKE 40 MG BY MOUTH ONCE DAILY AT BEDTIME) 90 tablet 3   No facility-administered medications prior to visit.     Allergies:   Penicillins; Sulfonamide derivatives; Niacin-lovastatin er; and Atorvastatin    Social History   Social History  . Marital Status: Married    Spouse Name: [name]   . Number of Children: 3  . Years of Education: 12+   Social History Main Topics  . Smoking status: Former Smoker -- 0.50 packs/day for 50 years    Types: Cigarettes    Quit date: 07/28/2013  . Smokeless tobacco: Never Used  . Alcohol Use: 0.0 oz/week    0 Standard drinks or equivalent per week     Comment:  socially, variable  . Drug Use: No  . Sexual Activity: Not Asked   Other Topics Concern  . None   Social History Narrative   Patient lives at home with spouse [name]   Patient has 3 children    Patient is right handed      Family History:  The patient's family history includes Alcohol abuse in his father; Heart attack in his father; Heart disease in his father, paternal aunt, and paternal uncle; Hypertension in his father and paternal aunt; Stroke in his father, mother, paternal aunt, and paternal uncle.   ROS:   Please see the history of present illness.    Review of Systems  Constitution: Negative.  HENT: Negative.   Eyes: Negative.   Cardiovascular: Negative.   Respiratory: Negative.   Skin: Negative.   Musculoskeletal: Negative.   Gastrointestinal: Negative.   Genitourinary: Negative.   Neurological: Negative.   Psychiatric/Behavioral: Negative.    All other systems reviewed and are negative.   PHYSICAL EXAM:   VS:  BP 126/64 mmHg  Pulse 84  Ht '6\' 1"'$  (1.854 m)  Wt 204 lb 1.9 oz (92.588 kg)  BMI 26.94 kg/m2   GEN: Well nourished, well developed, in no acute distress HEENT: normal Neck: no JVD, carotid bruits, or masses Cardiac: RRR; no murmurs, rubs, or gallops,no edema.  Intact distal pulses bilaterally.  Respiratory:  clear to auscultation bilaterally, normal work of breathing GI: soft, nontender, nondistended, + BS MS: no deformity or atrophy Skin: warm and dry, no rash Neuro:  Alert and Oriented x 3, Strength and sensation are intact Psych: euthymic mood, full  affect  Wt Readings from Last 3 Encounters:  01/19/16 204 lb 1.9 oz (92.588 kg)  12/30/15 210 lb 6.4 oz (95.437 kg)  12/14/15 201 lb 8 oz (91.4 kg)      Studies/Labs Reviewed:   EKG:  EKG is not ordered today.    Recent Labs: 02/08/2015: TSH 1.24 08/30/2015: ALT 28 01/04/2016: BUN 15; Creat 1.42*; Hemoglobin 12.6*; Platelets 284; Potassium 4.3; Sodium 138   Lipid Panel    Component Value Date/Time   CHOL 95* 01/04/2016 0932   TRIG 86 01/04/2016 0932   TRIG 97 10/11/2006 1129   HDL 39* 01/04/2016 0932   CHOLHDL 2.4 01/04/2016 0932   CHOLHDL 4.0 CALC 10/11/2006 1129   VLDL 17 01/04/2016 0932   LDLCALC 39 01/04/2016 0932    Additional studies/ records that were reviewed today include:  CPAP download    ASSESSMENT:    1. OSA (obstructive sleep apnea)   2. PAF (paroxysmal atrial fibrillation) ([name])   3. Benign essential HTN      PLAN:  In order of problems listed above:  1. OSA on CPAP and tolerating well.  Patient has been using and benefiting from CPAP use and will continue to benefit from therapy. His d/l today showed an AHI of 4.9/hr on 10cm H2O and 93% compliance in using more than 4 hours nightly.  He will continue on current settings.  2. PAF - maintaining NSR - continue Apixaban/ Multaq and CCB 3. HTN - controlled.  COntinue CCB.    Medication Adjustments/Labs and Tests Ordered: Current medicines are reviewed at length with the patient today.  Concerns regarding medicines are outlined above.  Medication changes, Labs and Tests ordered today are listed in the Patient Instructions below. Patient Instructions  Medication Instructions:  Your physician recommends that you continue on your current medications as directed. Please refer to the Current Medication list given to you today.   Labwork: None  Testing/Procedures: None  Follow-Up: Your physician wants you to follow-up in: 1 year with Dr. Radford Pax. You will receive a reminder letter in the mail two  months in advance. If you don't receive a letter, please call our office to schedule the follow-up appointment.   Any Other Special Instructions Will Be Listed Below (If Applicable).     If you need a refill on your cardiac medications before your next appointment, please call your pharmacy.       Lurena Nida, MD  01/19/2016 12:32 PM    [facility] [place], [place], [place]  [address] Phone: [phone]; Fax: [phone]

## 2016-01-20 ENCOUNTER — Encounter: Payer: BLUE CROSS/BLUE SHIELD | Admitting: Internal Medicine

## 2016-01-21 ENCOUNTER — Other Ambulatory Visit: Payer: Self-pay | Admitting: Internal Medicine

## 2016-01-21 LAB — CUP PACEART REMOTE DEVICE CHECK: MDC IDC SESS DTM: 20170209123725

## 2016-01-21 NOTE — Progress Notes (Signed)
Carelink summary report received. Battery status OK. Normal device function. No new symptom episodes, tachy episodes, brady, or pause episodes. 585 AF 19% +Eliquis + Multaq. Monthly summary reports and ROV/PRN

## 2016-02-08 ENCOUNTER — Ambulatory Visit (INDEPENDENT_AMBULATORY_CARE_PROVIDER_SITE_OTHER): Payer: BLUE CROSS/BLUE SHIELD | Admitting: *Deleted

## 2016-02-08 DIAGNOSIS — I480 Paroxysmal atrial fibrillation: Secondary | ICD-10-CM

## 2016-02-08 NOTE — Progress Notes (Signed)
Carelink Summary Report / Loop Recorder 

## 2016-02-22 LAB — CUP PACEART REMOTE DEVICE CHECK: Date Time Interrogation Session: 20170311123938

## 2016-02-24 ENCOUNTER — Encounter: Payer: Self-pay | Admitting: Hematology

## 2016-02-24 ENCOUNTER — Telehealth: Payer: Self-pay | Admitting: Hematology

## 2016-02-24 NOTE — Telephone Encounter (Signed)
Spoke with patient re new patient appointment to see Dr. Irene Limbo 03/07/2016 @ 11 am to arrive 10:30 am. Patient demographic and insurance information confirmed.

## 2016-02-26 LAB — CUP PACEART REMOTE DEVICE CHECK: MDC IDC SESS DTM: 20170410130526

## 2016-02-26 NOTE — Progress Notes (Signed)
Carelink summary report received. Battery status OK. Normal device function. No new symptom episodes, tachy episodes, brady, or pause episodes. No new AF episodes. Monthly summary reports and ROV/PRN 

## 2016-03-05 ENCOUNTER — Ambulatory Visit (INDEPENDENT_AMBULATORY_CARE_PROVIDER_SITE_OTHER): Payer: BLUE CROSS/BLUE SHIELD | Admitting: Internal Medicine

## 2016-03-05 ENCOUNTER — Encounter: Payer: Self-pay | Admitting: Internal Medicine

## 2016-03-05 VITALS — BP 162/82 | HR 62 | Temp 98.2°F | Resp 16 | Wt 203.0 lb

## 2016-03-05 DIAGNOSIS — C4491 Basal cell carcinoma of skin, unspecified: Secondary | ICD-10-CM | POA: Diagnosis not present

## 2016-03-05 DIAGNOSIS — Z0000 Encounter for general adult medical examination without abnormal findings: Secondary | ICD-10-CM

## 2016-03-05 DIAGNOSIS — I10 Essential (primary) hypertension: Secondary | ICD-10-CM | POA: Diagnosis not present

## 2016-03-05 DIAGNOSIS — N183 Chronic kidney disease, stage 3 unspecified: Secondary | ICD-10-CM

## 2016-03-05 NOTE — Assessment & Plan Note (Signed)
Following with dermatology

## 2016-03-05 NOTE — Assessment & Plan Note (Signed)
BP slightly elevated here today, but typically well-controlled Continue current medications

## 2016-03-05 NOTE — Progress Notes (Signed)
Subjective:    Patient ID: Troy Kenner [name], male    DOB: 1941/08/27, 75 y.o.   MRN: [id number]  HPI He is here for a physical exam.    Sob and fatigue:  He exercises twice a week with PT doing some weight and stretching.  He does not do much cardio.  For a while he has experienced dyspnea with exertion and fatigue with moderate exertion.  He is limited by his PAD in his legs and often has to stop to rest.  The DOE and fatigue always come together and do not come at rest.  It is not getting worse.  He was concerned about his heart, but has not discussed with his cardiologist.    Using cpap - initailly felt very more rested  - when he first started the cpap.  He still feels it is working, just not as much.  There has been little weight change.[name]    He denies any changes in his history of family history since he was here last. He has no other concerns.  Medications and allergies reviewed with patient and updated if appropriate.  Patient Active Problem List   Diagnosis Date Noted  . Benign essential HTN 01/19/2016  . Atherosclerotic peripheral vascular disease ([place]) 08/29/2015  . OSA (obstructive sleep apnea) 08/29/2015  . PAF (paroxysmal atrial fibrillation) ([place])   . Glaucoma 06/22/2015  . Dyspnea 02/04/2015  . Memory loss, short term 08/03/2014  . Aftercare following surgery of the circulatory system 07/28/2014  . Pain of right lower leg- and Left Thigh and leg 07/28/2014  . Anemia, unspecified 12/28/2013  . Carotid stenosis 07/24/2013  . Occlusion and stenosis of carotid artery without mention of cerebral infarction 07/09/2013  . COPD with emphysema ([place]) 07/09/2013  . Chronic renal disease, stage 3, moderately decreased glomerular filtration rate between 30-59 mL/min/1.73 square meter 07/06/2013  . TIA (transient ischemic attack) 07/06/2013  . Chest pain 12/01/2012  . Fatigue 12/01/2012  . Right carotid bruit 12/01/2012  . Compulsive tobacco user syndrome 10/16/2012  . Basal  cell carcinoma of skin 10/10/2012  . History of lung cancer 03/11/2012  . Lung nodule, multiple 03/11/2012  . PAD (peripheral artery disease) ([place]) 12/16/2011  . At risk for coronary artery disease 12/16/2011  . AAA (abdominal aortic aneurysm) ([place]) 09/27/2011  . Squamous cell lung cancer ([place]) 11/08/2010  . GAIT IMBALANCE 03/02/2009  . FASTING HYPERGLYCEMIA 03/02/2009  . COLONIC POLYPS, HX OF 03/02/2009  . OBSTRUCTIVE CHRONIC BRONCHITIS WITHOUT EXACERBAT 01/15/2008  . HYPERPLASIA PROSTATE UNS W/O UR OBST & OTH LUTS 01/15/2008  . HYPERLIPIDEMIA 09/29/2007  . Peripheral vascular disease ([place]) 09/29/2007  . LEUKOPLAKIA, VOCAL CORDS 09/29/2007  . FEMORAL BRUIT 09/29/2007    Current Outpatient Prescriptions on File Prior to Visit  Medication Sig Dispense Refill  . bimatoprost (LUMIGAN) 0.01 % SOLN Place 1 drop into the left eye at bedtime.    . Biotin 5000 MCG TABS Take 5,000 mcg by mouth daily.    . bisacodyl (DULCOLAX) 5 MG EC tablet Take 5 mg by mouth daily as needed for moderate constipation.    . brimonidine (ALPHAGAN P) 0.1 % SOLN Place 3 drops into the left eye 3 (three) times daily.     [name] CARDURA 4 MG tablet TAKE (1/2) TABLET DAILY. 45 tablet 2  . cilostazol (PLETAL) 100 MG tablet TAKE 1 TABLET TWICE DAILY. 180 tablet 1  . diltiazem (CARDIZEM CD) 120 MG 24 hr capsule TAKE 1 CAPSULE DAILY. 30 capsule 11  .  diltiazem (CARDIZEM) 30 MG tablet Take 30 mg by mouth 4 (four) times daily. TAKE 1 TABLET ('30mg'$ ) BY MOUTH EVERY 4 HOURS AS NEEDED (HR>100 AND BP >100)    . dorzolamide-timolol (COSOPT) 22.3-6.8 MG/ML ophthalmic solution Place 1 drop into the left eye 3 (three) times daily.     [name] dronedarone (MULTAQ) 400 MG tablet Take 400 mg by mouth 2 (two) times daily with a meal.    . ELIQUIS 5 MG TABS tablet TAKE 1 TABLET TWICE DAILY. (Patient taking differently: TAKE 1 TABLET BY MOUTH TWICE DAILY) 180 tablet 3  . FLUoxetine (PROZAC) 20 MG capsule TAKE (1) CAPSULE DAILY. 90 capsule 0  .  Ginkgo Biloba 40 MG TABS Take 40 mg by mouth daily.     . iron polysaccharides (POLY-IRON 150) 150 MG capsule Take 150 mg by mouth daily.    . Melatonin 5 MG TABS Take 5 mg by mouth at bedtime.    . Multiple Vitamin (MULTIVITAMIN WITH MINERALS) TABS tablet Take 1 tablet by mouth daily. Centrum Silver    . naproxen sodium (ALEVE) 220 MG tablet Take 220 mg by mouth daily as needed (pain).    . ranitidine (ZANTAC) 150 MG tablet Take 150 mg by mouth daily as needed for heartburn.     . rosuvastatin (CRESTOR) 40 MG tablet TAKE 1 TABLET ONCE DAILY. (Patient taking differently: TAKE 40 MG BY MOUTH ONCE DAILY AT BEDTIME) 90 tablet 3   No current facility-administered medications on file prior to visit.    Past Medical History  Diagnosis Date  . Hypertension   . Hyperlipidemia   . Glaucoma BOTH EYES    Dr Gershon Crane  . Hx of adenomatous colonic polyps 2005     X 2; 1 hyperplastic polyp; Dr Olevia Perches  . Impaired fasting glucose 2007    108; A1c5.4%  . History of lung cancer APRIL 2012  SQUAMOUS CELL---- S/P RIGHT LOWER LOBECTOMY AT [facility] --  NO CHEMORADIATION---  NO RECURRENCE     ONCOLOGIST- DR Tressie Stalker  LOV IN [facility] 10-27-2012  . GERD (gastroesophageal reflux disease)   . BPH (benign prostatic hypertrophy)   . Frequency of urination   . Urgency of urination   . Nocturia   . Lesion of bladder   . Microhematuria   . Arthritis   . History of basal cell carcinoma excision   . Exertional dyspnea   . Peripheral vascular disease ([place]) S/P ANGIOPLASTY AND STENTING    FOLLOWED  BY DR [name]  . PAD (peripheral artery disease) ([place]) ABI'S  JAN 2014  0.65 ON RIGHT ;  1.04 ON LEFT  . AAA (abdominal aortic aneurysm) ([facility]) LAST ABD. [name]  JAN 2014  3.4CM    MONITORED BY DR [name]  . Bilateral carotid artery stenosis DUPLEX 12-29-2012  BY DR [facility]    BILATERAL ICA STENOSIS 60-79%  . COPD (chronic obstructive pulmonary disease) ([place])   . Stroke [facility]) Jul 07, 2013    mini  TIA  . Cancer ([place]) 01-25-12     LUNG  . Atrial fibrillation ([place])   . Spinal stenosis Sept. 2015  . OSA (obstructive sleep apnea) 08/29/2015    Mild with AHI overall 6/hr but severe during REM sleep with AHI 30/hr with oxygen saturations < 88% for 5.5 minutes of total sleep time.    . Complication of anesthesia     had nausea after carotid artery surgery, states the medicine given to help the nausea, made it worse  . Benign essential HTN  01/19/2016    Past Surgical History  Procedure Laterality Date  . Angio plasty      X 4 in legs  . Colon polyectomy    . Trabecular surgery      OS  . Lung lobectomy  01/24/2011     RIGHT UPPER LOBE  (SQUAMOUS CELL CARCINOMA) Dr Dorthea Cove , [facility]. No chemotherapyor radiation  . Laryngoscopy  06-27-2004    BX VOCAL CORD  (LEUKOPLAKIA)  PER PT NO ISSUES SINCE  . Cataract extraction w/ intraocular lens  implant, bilateral    . Basal cell carcinoma excision      MULTIPLE TIMES--  RIGHT FOREARM, CHEEKS, AND BACK  . Femoral-popliteal bypass graft Left 1994  [facility]    AND 2001  IN [place]  . Cardiovascular stress test  12-08-2012  DR [facility]    NORMAL LEXISCAN WITH NO EXERCISE NUCLEAR STUDY/ EF 66%/   NO ISCHEMIA/ NO SIGNIFICANT CHANGE FROM PRIOR STUDY  . Transthoracic echocardiogram  12-29-2012  DR [facility]    MILD LVH/  LVSF NORMAL/ EF 55-73%/  GRADE I DIASTOLIC DYSFUNCTION  . Aortogram  07-27-2002    MILD DIFFUSE ILIAC ARTERY OCCLUSIVE DISEASE /  LEFT RENAL ARTERY 20%/ PATENT LEFT FEM-POP GRAFT/ MILD SFA AND POPLITEAL ARTERY OCCLUSIVE DISEASE W/ SEVERE KIDNEY OCCLUSIVE DISEASE  . Cystoscopy w/ retrogrades Bilateral 01/21/2013    Procedure: CYSTOSCOPY WITH RETROGRADE PYELOGRAM;  Surgeon: Molli Hazard, MD;  Location: [facility];  Service: Urology;  Laterality: Bilateral;   CYSTO, BLADDER BIOPSY, BILATERAL RETROGRADE PYELOGRAM  RAD TECH FROM RADIOLOGY PER JOY  . Cystoscopy with biopsy N/A 01/21/2013    Procedure: CYSTOSCOPY WITH BIOPSY;  Surgeon: Molli Hazard, MD;  Location: [facility];  Service: Urology;  Laterality: N/A;  . Endarterectomy Right 07/14/2013    Procedure: ENDARTERECTOMY CAROTID;  Surgeon: Angelia Mould, MD;  Location: [place];  Service: Vascular;  Laterality: Right;  . Patch angioplasty Right 07/14/2013    Procedure: PATCH ANGIOPLASTY;  Surgeon: Angelia Mould, MD;  Location: [facility] OR;  Service: Vascular;  Laterality: Right;  . Carotid endarterectomy Right 07-14-13    cea  . Carotid angiogram N/A 07/10/2013    Procedure: CAROTID ANGIOGRAM;  Surgeon: Elam Dutch, MD;  Location: [facility] CATH LAB;  Service: Cardiovascular;  Laterality: N/A;  . Ep implantable device N/A 08/12/2015    Procedure: Loop Recorder Insertion;  Surgeon: Thompson Grayer, MD;  Location: [place] CV LAB;  Service: Cardiovascular;  Laterality: N/A;  . Peripheral vascular catheterization N/A 08/29/2015    Procedure: Abdominal Aortogram;  Surgeon: Angelia Mould, MD;  Location: [place] CV LAB;  Service: Cardiovascular;  Laterality: N/A;  . Lower extremity angiogram Bilateral 08/29/2015    Procedure: Lower Extremity Angiogram;  Surgeon: Angelia Mould, MD;  Location: [place] CV LAB;  Service: Cardiovascular;  Laterality: Bilateral;  . Femoral-popliteal bypass graft Left 08/30/2015    Procedure: REVISION OF BYPASS GRAFT Left  FEMORAL-POPLITEAL ARTERY;  Surgeon: Angelia Mould, MD;  Location: [place];  Service: Vascular;  Laterality: Left;  . Patch angioplasty Left 08/30/2015    Procedure: VEIN PATCH ANGIOPLASTY OF PROXIMAL Left BYPASS GRAFT;  Surgeon: Angelia Mould, MD;  Location: [place];  Service: Vascular;  Laterality: Left;    Social History   Social History  . Marital Status: Married    Spouse Name: [name]   . Number of Children: 3  . Years of Education: 12+   Social History Main  Topics  . Smoking status: Former Smoker -- 0.50 packs/day for 50 years    Types: Cigarettes    Quit date:  07/28/2013  . Smokeless tobacco: Never Used  . Alcohol Use: 0.0 oz/week    0 Standard drinks or equivalent per week     Comment:  socially, variable  . Drug Use: No  . Sexual Activity: Not on file   Other Topics Concern  . Not on file   Social History Narrative   Patient lives at home with spouse [name]   Patient has 3 children    Patient is right handed     Family History  Problem Relation Age of Onset  . Stroke Mother     mini strokes  . Alcohol abuse Father   . Heart disease Father     MI after 61  . Stroke Father   . Hypertension Father   . Heart attack Father   . Heart disease Paternal Aunt     several  . Hypertension Paternal Aunt     several  . Stroke Paternal Aunt     several  . Stroke Paternal Uncle     several  . Heart disease Paternal Uncle     several;2 had MI pre 22    Review of Systems  Constitutional: Positive for fatigue (with activity). Negative for fever, chills and appetite change.  HENT: Positive for hearing loss.   Eyes: Positive for visual disturbance (glaucoma).  Respiratory: Positive for shortness of breath (with activity). Negative for cough and wheezing.   Cardiovascular: Positive for leg swelling (mild in left since bypass). Negative for chest pain and palpitations.  Gastrointestinal: Negative for nausea, abdominal pain, diarrhea, constipation and blood in stool.       Rare gerd  Genitourinary: Negative for dysuria and hematuria.  Neurological: Negative for dizziness, light-headedness and headaches.       Objective:   Filed Vitals:   03/05/16 1059  BP: 162/82  Pulse: 62  Temp: 98.2 F (36.8 C)  Resp: 16   Filed Weights   03/05/16 1059  Weight: 203 lb (92.08 kg)   Body mass index is 26.79 kg/(m^2).   Physical Exam Constitutional: He appears well-developed and well-nourished. No distress.  HENT:  Head: Normocephalic and atraumatic.  Right Ear: External ear normal.  Left Ear: External ear normal.  Mouth/Throat: Oropharynx  is clear and moist.  Normal ear canals and TM b/l  Eyes: Conjunctivae and EOM are normal.  Neck: Neck supple. No tracheal deviation present. No thyromegaly present.  No carotid bruit  Cardiovascular: Normal rate, regular rhythm, normal heart sounds and intact distal pulses.   No murmur heard. Pulmonary/Chest: Effort normal and breath sounds normal. No respiratory distress. He has no wheezes. He has no rales.  Abdominal: Soft. Bowel sounds are normal. He exhibits no distension. There is no tenderness.  Genitourinary: deferred to urology Musculoskeletal: He exhibits no edema.  Lymphadenopathy:    He has no cervical adenopathy.  Skin: Skin is warm and dry. He is not diaphoretic.  Psychiatric: He has a normal mood and affect. His behavior is normal.         Assessment & Plan:   Physical exam: Screening blood work deferred - we reviewed blood work done by urology recently Immunizations  - discussed shingles vaccine, other vaccines up to date Colonoscopy  Up to date  Eye exams Up to date  EKG -  Done by cardiology Exercise - he is doing some exercise, but not enough -  he does exercise twice a week with a trainer - needs to increase cardio, but will see his cardiologist first for possible stress test given his fatigue and sob with exertion, which may be related to deconditioning. Weight - BMI just above normal, stressed regular exercise, diet is healthy Skin - sees dermatology  Substance abuse - no abuse  Dyspnea on exertion, fatigue with exertion He will call his cardiologist and set up an appointment to be evaluated for possible stress test He deferred any pulmonary evaluation at this time If his symptoms persist after his stress test and after trying to increase his activity-specifically cardio exercises. We will consider evaluation of his lungs again, but he saw pulmonary last year for the same and likely does not need repeat testing at this time   See Problem List for Assessment  and Plan of chronic medical problems.

## 2016-03-05 NOTE — Patient Instructions (Addendum)
[name] , Thank you for taking time to come for your a Wellness Visit. I appreciate your ongoing commitment to your health goals. Please review the following plan we discussed and let me know if I can assist you in the future.   These are the goals we discussed: Goals    Increase exercise after you see your cardiologist      This is a list of the screening recommended for you and due dates:  Health Maintenance  Topic Date Due  . Shingles Vaccine  09/30/2000  . Flu Shot  05/01/2016  . Colon Cancer Screening  05/14/2017  . Tetanus Vaccine  03/14/2020  . Pneumonia vaccines  Completed   Health Maintenance, Male A healthy lifestyle and preventative care can promote health and wellness.  Maintain regular health, dental, and eye exams.  Eat a healthy diet. Foods like vegetables, fruits, whole grains, low-fat dairy products, and lean protein foods contain the nutrients you need and are low in calories. Decrease your intake of foods high in solid fats, added sugars, and salt. Get information about a proper diet from your health care provider, if necessary.  Regular physical exercise is one of the most important things you can do for your health. Most adults should get at least 150 minutes of moderate-intensity exercise (any activity that increases your heart rate and causes you to sweat) each week. In addition, most adults need muscle-strengthening exercises on 2 or more days a week.   Maintain a healthy weight. The body mass index (BMI) is a screening tool to identify possible weight problems. It provides an estimate of body fat based on height and weight. Your health care provider can find your BMI and can help you achieve or maintain a healthy weight. For males 20 years and older:  A BMI below 18.5 is considered underweight.  A BMI of 18.5 to 24.9 is normal.  A BMI of 25 to 29.9 is considered overweight.  A BMI of 30 and above is considered obese.  Maintain normal blood lipids and  cholesterol by exercising and minimizing your intake of saturated fat. Eat a balanced diet with plenty of fruits and vegetables. Blood tests for lipids and cholesterol should begin at age 47 and be repeated every 5 years. If your lipid or cholesterol levels are high, you are over age 86, or you are at high risk for heart disease, you may need your cholesterol levels checked more frequently.Ongoing high lipid and cholesterol levels should be treated with medicines if diet and exercise are not working.  If you smoke, find out from your health care provider how to quit. If you do not use tobacco, do not start.  Lung cancer screening is recommended for adults aged 17-80 years who are at high risk for developing lung cancer because of a history of smoking. A yearly low-dose CT scan of the lungs is recommended for people who have at least a 30-pack-year history of smoking and are current smokers or have quit within the past 15 years. A pack year of smoking is smoking an average of 1 pack of cigarettes a day for 1 year (for example, a 30-pack-year history of smoking could mean smoking 1 pack a day for 30 years or 2 packs a day for 15 years). Yearly screening should continue until the smoker has stopped smoking for at least 15 years. Yearly screening should be stopped for people who develop a health problem that would prevent them from having lung cancer treatment.  If you choose to drink alcohol, do not have more than 2 drinks per day. One drink is considered to be 12 oz (360 mL) of beer, 5 oz (150 mL) of wine, or 1.5 oz (45 mL) of liquor.  Avoid the use of street drugs. Do not share needles with anyone. Ask for help if you need support or instructions about stopping the use of drugs.  High blood pressure causes heart disease and increases the risk of stroke. High blood pressure is more likely to develop in:  People who have blood pressure in the end of the normal range (100-139/85-89 mm Hg).  People who are  overweight or obese.  People who are African American.  If you are 84-10 years of age, have your blood pressure checked every 3-5 years. If you are 27 years of age or older, have your blood pressure checked every year. You should have your blood pressure measured twice--once when you are at a hospital or clinic, and once when you are not at a hospital or clinic. Record the average of the two measurements. To check your blood pressure when you are not at a hospital or clinic, you can use:  An automated blood pressure machine at a pharmacy.  A home blood pressure monitor.  If you are 55-68 years old, ask your health care provider if you should take aspirin to prevent heart disease.  Diabetes screening involves taking a blood sample to check your fasting blood sugar level. This should be done once every 3 years after age 29 if you are at a normal weight and without risk factors for diabetes. Testing should be considered at a younger age or be carried out more frequently if you are overweight and have at least 1 risk factor for diabetes.  Colorectal cancer can be detected and often prevented. Most routine colorectal cancer screening begins at the age of 12 and continues through age 20. However, your health care provider may recommend screening at an earlier age if you have risk factors for colon cancer. On a yearly basis, your health care provider may provide home test kits to check for hidden blood in the stool. A small camera at the end of a tube may be used to directly examine the colon (sigmoidoscopy or colonoscopy) to detect the earliest forms of colorectal cancer. Talk to your health care provider about this at age 80 when routine screening begins. A direct exam of the colon should be repeated every 5-10 years through age 51, unless early forms of precancerous polyps or small growths are found.  People who are at an increased risk for hepatitis B should be screened for this virus. You are  considered at high risk for hepatitis B if:  You were born in a country where hepatitis B occurs often. Talk with your health care provider about which countries are considered high risk.  Your parents were born in a high-risk country and you have not received a shot to protect against hepatitis B (hepatitis B vaccine).  You have HIV or AIDS.  You use needles to inject street drugs.  You live with, or have sex with, someone who has hepatitis B.  You are a man who has sex with other men (MSM).  You get hemodialysis treatment.  You take certain medicines for conditions like cancer, organ transplantation, and autoimmune conditions.  Hepatitis C blood testing is recommended for all people born from [birth year] through 1966 and any individual with known risk factors for hepatitis C.  Healthy men should no longer receive prostate-specific antigen (PSA) blood tests as part of routine cancer screening. Talk to your health care provider about prostate cancer screening.  Testicular cancer screening is not recommended for adolescents or adult males who have no symptoms. Screening includes self-exam, a health care provider exam, and other screening tests. Consult with your health care provider about any symptoms you have or any concerns you have about testicular cancer.  Practice safe sex. Use condoms and avoid high-risk sexual practices to reduce the spread of sexually transmitted infections (STIs).  You should be screened for STIs, including gonorrhea and chlamydia if:  You are sexually active and are younger than 24 years.  You are older than 24 years, and your health care provider tells you that you are at risk for this type of infection.  Your sexual activity has changed since you were last screened, and you are at an increased risk for chlamydia or gonorrhea. Ask your health care provider if you are at risk.  If you are at risk of being infected with HIV, it is recommended that you take a  prescription medicine daily to prevent HIV infection. This is called pre-exposure prophylaxis (PrEP). You are considered at risk if:  You are a man who has sex with other men (MSM).  You are a heterosexual man who is sexually active with multiple partners.  You take drugs by injection.  You are sexually active with a partner who has HIV.  Talk with your health care provider about whether you are at high risk of being infected with HIV. If you choose to begin PrEP, you should first be tested for HIV. You should then be tested every 3 months for as long as you are taking PrEP.  Use sunscreen. Apply sunscreen liberally and repeatedly throughout the day. You should seek shade when your shadow is shorter than you. Protect yourself by wearing long sleeves, pants, a wide-brimmed hat, and sunglasses year round whenever you are outdoors.  Tell your health care provider of new moles or changes in moles, especially if there is a change in shape or color. Also, tell your health care provider if a mole is larger than the size of a pencil eraser.  A one-time screening for abdominal aortic aneurysm (AAA) and surgical repair of large AAAs by ultrasound is recommended for men aged 62-75 years who are current or former smokers.  Stay current with your vaccines (immunizations).   This information is not intended to replace advice given to you by your health care provider. Make sure you discuss any questions you have with your health care provider.   Document Released: 03/15/2008 Document Revised: 10/08/2014 Document Reviewed: 02/12/2011 Elsevier Interactive Patient Education [employer].

## 2016-03-05 NOTE — Progress Notes (Signed)
Pre visit review using our clinic review tool, if applicable. No additional management support is needed unless otherwise documented below in the visit note. 

## 2016-03-07 ENCOUNTER — Encounter: Payer: Self-pay | Admitting: Hematology

## 2016-03-07 ENCOUNTER — Other Ambulatory Visit (HOSPITAL_BASED_OUTPATIENT_CLINIC_OR_DEPARTMENT_OTHER): Payer: BLUE CROSS/BLUE SHIELD

## 2016-03-07 ENCOUNTER — Ambulatory Visit (HOSPITAL_BASED_OUTPATIENT_CLINIC_OR_DEPARTMENT_OTHER): Payer: BLUE CROSS/BLUE SHIELD | Admitting: Hematology

## 2016-03-07 ENCOUNTER — Telehealth: Payer: Self-pay | Admitting: Hematology

## 2016-03-07 VITALS — BP 139/60 | HR 67 | Temp 97.9°F | Resp 18

## 2016-03-07 DIAGNOSIS — Z85118 Personal history of other malignant neoplasm of bronchus and lung: Secondary | ICD-10-CM | POA: Diagnosis not present

## 2016-03-07 DIAGNOSIS — D472 Monoclonal gammopathy: Secondary | ICD-10-CM

## 2016-03-07 LAB — CBC & DIFF AND RETIC
BASO%: 0.3 % (ref 0.0–2.0)
Basophils Absolute: 0 10*3/uL (ref 0.0–0.1)
EOS%: 1.1 % (ref 0.0–7.0)
Eosinophils Absolute: 0.1 10*3/uL (ref 0.0–0.5)
HEMATOCRIT: 36.3 % — AB (ref 38.4–49.9)
HGB: 11.8 g/dL — ABNORMAL LOW (ref 13.0–17.1)
Immature Retic Fract: 8.9 % (ref 3.00–10.60)
LYMPH#: 0.8 10*3/uL — AB (ref 0.9–3.3)
LYMPH%: 12 % — AB (ref 14.0–49.0)
MCH: 27.6 pg (ref 27.2–33.4)
MCHC: 32.5 g/dL (ref 32.0–36.0)
MCV: 85 fL (ref 79.3–98.0)
MONO#: 0.8 10*3/uL (ref 0.1–0.9)
MONO%: 11.7 % (ref 0.0–14.0)
NEUT#: 5.2 10*3/uL (ref 1.5–6.5)
NEUT%: 74.9 % (ref 39.0–75.0)
PLATELETS: 248 10*3/uL (ref 140–400)
RBC: 4.27 10*6/uL (ref 4.20–5.82)
RDW: 14.7 % — ABNORMAL HIGH (ref 11.0–14.6)
Retic %: 1.23 % (ref 0.80–1.80)
Retic Ct Abs: 52.52 10*3/uL (ref 34.80–93.90)
WBC: 7 10*3/uL (ref 4.0–10.3)

## 2016-03-07 LAB — COMPREHENSIVE METABOLIC PANEL
ALT: 23 U/L (ref 0–55)
ANION GAP: 8 meq/L (ref 3–11)
AST: 18 U/L (ref 5–34)
Albumin: 3.2 g/dL — ABNORMAL LOW (ref 3.5–5.0)
Alkaline Phosphatase: 75 U/L (ref 40–150)
BUN: 12.5 mg/dL (ref 7.0–26.0)
CALCIUM: 9.8 mg/dL (ref 8.4–10.4)
CHLORIDE: 109 meq/L (ref 98–109)
CO2: 25 meq/L (ref 22–29)
Creatinine: 1.2 mg/dL (ref 0.7–1.3)
EGFR: 57 mL/min/{1.73_m2} — AB (ref >90)
Glucose: 85 mg/dl (ref 70–140)
Potassium: 4.5 mEq/L (ref 3.5–5.1)
Sodium: 142 mEq/L (ref 136–145)
Total Bilirubin: 0.5 mg/dL (ref 0.20–1.20)
Total Protein: 7.7 g/dL (ref 6.4–8.3)

## 2016-03-07 NOTE — Telephone Encounter (Signed)
Gave avs  ° °

## 2016-03-08 LAB — MULTIPLE MYELOMA PANEL, SERUM
ALPHA2 GLOB SERPL ELPH-MCNC: 1.2 g/dL — AB (ref 0.4–1.0)
Albumin SerPl Elph-Mcnc: 3.1 g/dL (ref 2.9–4.4)
Albumin/Glob SerPl: 0.9 (ref 0.7–1.7)
Alpha 1: 0.3 g/dL (ref 0.0–0.4)
B-GLOBULIN SERPL ELPH-MCNC: 1.2 g/dL (ref 0.7–1.3)
GAMMA GLOB SERPL ELPH-MCNC: 1.1 g/dL (ref 0.4–1.8)
Globulin, Total: 3.7 g/dL (ref 2.2–3.9)
IgA, Qn, Serum: 318 mg/dL (ref 61–437)
IgM, Qn, Serum: 59 mg/dL (ref 15–143)
Total Protein: 6.8 g/dL (ref 6.0–8.5)

## 2016-03-08 LAB — KAPPA/LAMBDA LIGHT CHAINS
Ig Kappa Free Light Chain: 40.9 mg/L — ABNORMAL HIGH (ref 3.3–19.4)
Ig Lambda Free Light Chain: 18.5 mg/L (ref 5.7–26.3)
Kappa/Lambda FluidC Ratio: 2.21 — ABNORMAL HIGH (ref 0.26–1.65)

## 2016-03-08 LAB — SEDIMENTATION RATE: SED RATE: 12 mm/h (ref 0–30)

## 2016-03-09 ENCOUNTER — Ambulatory Visit (INDEPENDENT_AMBULATORY_CARE_PROVIDER_SITE_OTHER): Payer: BLUE CROSS/BLUE SHIELD | Admitting: *Deleted

## 2016-03-09 ENCOUNTER — Encounter: Payer: Self-pay | Admitting: *Deleted

## 2016-03-09 ENCOUNTER — Telehealth: Payer: Self-pay | Admitting: *Deleted

## 2016-03-09 DIAGNOSIS — R0602 Shortness of breath: Secondary | ICD-10-CM

## 2016-03-09 DIAGNOSIS — I480 Paroxysmal atrial fibrillation: Secondary | ICD-10-CM | POA: Diagnosis not present

## 2016-03-09 NOTE — Telephone Encounter (Signed)
Per Dr Troy [name] has been having new symptoms of exertional shortness of breath, schedule  stress myoview and echocardiogram.

## 2016-03-12 ENCOUNTER — Other Ambulatory Visit: Payer: Self-pay | Admitting: Internal Medicine

## 2016-03-12 LAB — UPEP/TP, 24-HR URINE
ALBUMIN, U: 18.3 %
ALPHA-1-GLOBULIN, U: 2.9 %
ALPHA-2-GLOBULIN, U: 18.8 %
Beta Globulin, U: 44.2 %
Gamma Globulin, U: 15.8 %
PROTEIN 24H UR: 120 mg/(24.h) (ref 30–150)
PROTEIN UR: 21.8 mg/dL

## 2016-03-12 NOTE — Telephone Encounter (Signed)
I spoke with patient about echo and myoview, will forward to [facility] to schedule.

## 2016-03-12 NOTE — Telephone Encounter (Signed)
LMTCB for pt to discuss echo and myoview.

## 2016-03-16 LAB — CUP PACEART REMOTE DEVICE CHECK
Date Time Interrogation Session: 20170510133604
Date Time Interrogation Session: 20170609140622

## 2016-03-16 NOTE — Progress Notes (Signed)
Carelink Summary Report 

## 2016-04-09 ENCOUNTER — Ambulatory Visit (INDEPENDENT_AMBULATORY_CARE_PROVIDER_SITE_OTHER): Payer: BLUE CROSS/BLUE SHIELD | Admitting: *Deleted

## 2016-04-09 DIAGNOSIS — I480 Paroxysmal atrial fibrillation: Secondary | ICD-10-CM | POA: Diagnosis not present

## 2016-04-09 NOTE — Progress Notes (Signed)
Carelink Summary Report / Loop Recorder 

## 2016-04-10 ENCOUNTER — Telehealth (HOSPITAL_COMMUNITY): Payer: Self-pay | Admitting: *Deleted

## 2016-04-10 NOTE — Telephone Encounter (Signed)
Attempted to call patient regarding upcoming appointment - busy x 2.   Troy [name]

## 2016-04-11 ENCOUNTER — Telehealth (HOSPITAL_COMMUNITY): Payer: Self-pay | Admitting: *Deleted

## 2016-04-11 NOTE — Telephone Encounter (Signed)
Left message on voicemail in reference to upcoming appointment scheduled for 04/13/16. Phone number given for a call back so details instructions can be given. Lynda Capistran, Ranae [place]

## 2016-04-13 ENCOUNTER — Other Ambulatory Visit: Payer: Self-pay

## 2016-04-13 ENCOUNTER — Ambulatory Visit (HOSPITAL_BASED_OUTPATIENT_CLINIC_OR_DEPARTMENT_OTHER): Payer: BLUE CROSS/BLUE SHIELD

## 2016-04-13 ENCOUNTER — Ambulatory Visit (HOSPITAL_COMMUNITY): Payer: BLUE CROSS/BLUE SHIELD | Attending: Cardiology

## 2016-04-13 DIAGNOSIS — R0602 Shortness of breath: Secondary | ICD-10-CM | POA: Diagnosis present

## 2016-04-13 DIAGNOSIS — I059 Rheumatic mitral valve disease, unspecified: Secondary | ICD-10-CM | POA: Insufficient documentation

## 2016-04-13 DIAGNOSIS — J449 Chronic obstructive pulmonary disease, unspecified: Secondary | ICD-10-CM | POA: Diagnosis not present

## 2016-04-13 DIAGNOSIS — I10 Essential (primary) hypertension: Secondary | ICD-10-CM | POA: Diagnosis not present

## 2016-04-13 DIAGNOSIS — Z87891 Personal history of nicotine dependence: Secondary | ICD-10-CM | POA: Diagnosis not present

## 2016-04-13 DIAGNOSIS — E785 Hyperlipidemia, unspecified: Secondary | ICD-10-CM | POA: Insufficient documentation

## 2016-04-13 MED ORDER — TECHNETIUM TC 99M TETROFOSMIN IV KIT
10.9000 | PACK | Freq: Once | INTRAVENOUS | Status: AC | PRN
  Administered 2016-04-13: 10.9 via INTRAVENOUS
  Filled 2016-04-13: qty 11

## 2016-04-16 ENCOUNTER — Ambulatory Visit (HOSPITAL_COMMUNITY): Payer: BLUE CROSS/BLUE SHIELD | Attending: Cardiology

## 2016-04-16 DIAGNOSIS — R0602 Shortness of breath: Secondary | ICD-10-CM | POA: Diagnosis not present

## 2016-04-16 LAB — MYOCARDIAL PERFUSION IMAGING
CHL CUP NUCLEAR SRS: 4
CHL CUP RESTING HR STRESS: 56 {beats}/min
LV sys vol: 39 mL
LVDIAVOL: 101 mL (ref 62–150)
NUC STRESS TID: 0.95
Peak HR: 76 {beats}/min
RATE: 0.34
SDS: 3
SSS: 4

## 2016-04-16 MED ORDER — TECHNETIUM TC 99M TETROFOSMIN IV KIT
33.0000 | PACK | Freq: Once | INTRAVENOUS | Status: AC | PRN
  Administered 2016-04-16: 33 via INTRAVENOUS
  Filled 2016-04-16: qty 33

## 2016-04-16 MED ORDER — REGADENOSON 0.4 MG/5ML IV SOLN
0.4000 mg | Freq: Once | INTRAVENOUS | Status: AC
  Administered 2016-04-16: 0.4 mg via INTRAVENOUS

## 2016-04-18 ENCOUNTER — Ambulatory Visit (HOSPITAL_COMMUNITY): Payer: BLUE CROSS/BLUE SHIELD

## 2016-04-18 ENCOUNTER — Telehealth: Payer: Self-pay | Admitting: Cardiology

## 2016-04-18 ENCOUNTER — Ambulatory Visit: Payer: BLUE CROSS/BLUE SHIELD | Admitting: Cardiology

## 2016-04-18 NOTE — Telephone Encounter (Signed)
Spoke with patient, he is requesting to cancel appt with Dr Aundra Dubin for today since recent echo and myoview were okay, appt has been cancelled at pt's request.

## 2016-04-18 NOTE — Telephone Encounter (Signed)
LMTCB for pt with pt's wife, pt not at home, no cell number.

## 2016-04-18 NOTE — Telephone Encounter (Signed)
New message    Pt verbalized that he is returning call for rn

## 2016-04-19 NOTE — Progress Notes (Signed)
[name]    HEMATOLOGY/ONCOLOGY CONSULTATION NOTE  Date of Service: 03/07/2016   Patient Care Team: Binnie Rail, MD as PCP - General (Internal Medicine) Everardo All, MD (Hematology and Oncology) Lerry Paterson, MD as Referring Physician (Thoracic Surgery) Larey Dresser, MD as Consulting Physician (Cardiology) Susa Day, MD as Consulting Physician (Orthopedic Surgery)  CHIEF COMPLAINTS/PURPOSE OF CONSULTATION:  H/o Lung cancer  HISTORY OF PRESENTING ILLNESS:  Troy [name]. is a wonderful 75 y.o. male who has been referred to [name] by Dr .Binnie Rail, MD for evaluation and management of his known h/o lung cancer.  History of dyslipidemia, glaucoma, adenomatous colonic polyps, impaired fasting glucose, GERD, BPH , AAA, CVA, obstructive sleep apnea, COPD and PVD.  In April 2012 he was diagnosed with right lower lobe poorly differentiated squamous cell carcinoma of the lung with lymphovascular invasion. He had resection by Dr. Elenor Quinones at [facility]  and this was pathologically noted to be a Stage I (pT2a,pN0,M0) tumor with lymphovascular invasion and was noted to be grade 3 . Patient was being followed by Dr. Everardo All for medical oncology and was last seen in clinic on 10/27/2012 until which time he had no evidence of disease recurrence . He has continued to follow-up with Dr Elenor Quinones and was last seen in his thoracic surgery clinic on 04/07/2015 and has an appointment coming up in July 2017.  Patient has been referred for surveillance of his non-small cell lung cancer. He notes no acute new changes since his last visit and notes no acute new symptoms. Breathing has been stable. No unusual weight loss. No chest pain no new shortness of breath or new cough. He has been more physically active. Continues to stay off the "smokes".   No other concerning new focal symptoms. MEDICAL HISTORY:  Past Medical History  Diagnosis Date  . Hypertension   . Hyperlipidemia   . Glaucoma BOTH EYES    Dr  Gershon Crane  . Hx of adenomatous colonic polyps 2005     X 2; 1 hyperplastic polyp; Dr Olevia Perches  . Impaired fasting glucose 2007    108; A1c5.4%  . History of lung cancer APRIL 2012  SQUAMOUS CELL---- S/P RIGHT LOWER LOBECTOMY AT [facility] --  NO CHEMORADIATION---  NO RECURRENCE     ONCOLOGIST- DR Tressie Stalker  LOV IN [facility] 10-27-2012  . GERD (gastroesophageal reflux disease)   . BPH (benign prostatic hypertrophy)   . Frequency of urination   . Urgency of urination   . Nocturia   . Lesion of bladder   . Microhematuria   . Arthritis   . History of basal cell carcinoma excision   . Exertional dyspnea   . Peripheral vascular disease ([place]) S/P ANGIOPLASTY AND STENTING    FOLLOWED  BY DR [name]  . PAD (peripheral artery disease) ([place]) ABI'S  JAN 2014  0.65 ON RIGHT ;  1.04 ON LEFT  . AAA (abdominal aortic aneurysm) ([facility]) LAST ABD. [name]  JAN 2014  3.4CM    MONITORED BY DR [name]  . Bilateral carotid artery stenosis DUPLEX 12-29-2012  BY DR [facility]    BILATERAL ICA STENOSIS 60-79%  . COPD (chronic obstructive pulmonary disease) ([place])   . Stroke [facility]) Jul 07, 2013    mini  TIA  . Cancer ([place]) 01-25-12    LUNG  . Atrial fibrillation ([place])   . Spinal stenosis Sept. 2015  . OSA (obstructive sleep apnea) 08/29/2015    Mild with AHI overall 6/hr but severe during REM sleep with  AHI 30/hr with oxygen saturations < 88% for 5.5 minutes of total sleep time.    . Complication of anesthesia     had nausea after carotid artery surgery, states the medicine given to help the nausea, made it worse  . Benign essential HTN 01/19/2016   ONCOLOGIC HISTORY History of lung cancer - right lower lobe squamous cell carcinoma  01/24/11 - Bronchoscopy, mediastinoscopy, right VATS lower lobectomy by Dr Elenor Quinones Pathology: Pathological stage T2a N0. 01/24/11 - 3. 7 cm poorly differentiated squamous cell carcinoma with positive vascular invasion. LN stations 4R, 4L, 7, 8, 9, 10, 11, and 12 negative for malignancy.  Postoperative  therapy: None      SURGICAL HISTORY: Past Surgical History  Procedure Laterality Date  . Angio plasty      X 4 in legs  . Colon polyectomy    . Trabecular surgery      OS  . Lung lobectomy  01/24/2011     RIGHT UPPER LOBE  (SQUAMOUS CELL CARCINOMA) Dr Dorthea Cove , [facility]. No chemotherapyor radiation  . Laryngoscopy  06-27-2004    BX VOCAL CORD  (LEUKOPLAKIA)  PER PT NO ISSUES SINCE  . Cataract extraction w/ intraocular lens  implant, bilateral    . Basal cell carcinoma excision      MULTIPLE TIMES--  RIGHT FOREARM, CHEEKS, AND BACK  . Femoral-popliteal bypass graft Left 1994  [facility]    AND 2001  IN [place]  . Cardiovascular stress test  12-08-2012  DR [facility]    NORMAL LEXISCAN WITH NO EXERCISE NUCLEAR STUDY/ EF 66%/   NO ISCHEMIA/ NO SIGNIFICANT CHANGE FROM PRIOR STUDY  . Transthoracic echocardiogram  12-29-2012  DR [facility]    MILD LVH/  LVSF NORMAL/ EF 53-61%/  GRADE I DIASTOLIC DYSFUNCTION  . Aortogram  07-27-2002    MILD DIFFUSE ILIAC ARTERY OCCLUSIVE DISEASE /  LEFT RENAL ARTERY 20%/ PATENT LEFT FEM-POP GRAFT/ MILD SFA AND POPLITEAL ARTERY OCCLUSIVE DISEASE W/ SEVERE KIDNEY OCCLUSIVE DISEASE  . Cystoscopy w/ retrogrades Bilateral 01/21/2013    Procedure: CYSTOSCOPY WITH RETROGRADE PYELOGRAM;  Surgeon: Molli Hazard, MD;  Location: [facility];  Service: Urology;  Laterality: Bilateral;   CYSTO, BLADDER BIOPSY, BILATERAL RETROGRADE PYELOGRAM  RAD TECH FROM RADIOLOGY PER JOY  . Cystoscopy with biopsy N/A 01/21/2013    Procedure: CYSTOSCOPY WITH BIOPSY;  Surgeon: Molli Hazard, MD;  Location: [facility];  Service: Urology;  Laterality: N/A;  . Endarterectomy Right 07/14/2013    Procedure: ENDARTERECTOMY CAROTID;  Surgeon: Angelia Mould, MD;  Location: [place];  Service: Vascular;  Laterality: Right;  . Patch angioplasty Right 07/14/2013    Procedure: PATCH ANGIOPLASTY;  Surgeon: Angelia Mould, MD;  Location: [facility] OR;   Service: Vascular;  Laterality: Right;  . Carotid endarterectomy Right 07-14-13    cea  . Carotid angiogram N/A 07/10/2013    Procedure: CAROTID ANGIOGRAM;  Surgeon: Elam Dutch, MD;  Location: [facility] CATH LAB;  Service: Cardiovascular;  Laterality: N/A;  . Ep implantable device N/A 08/12/2015    Procedure: Loop Recorder Insertion;  Surgeon: Thompson Grayer, MD;  Location: [place] CV LAB;  Service: Cardiovascular;  Laterality: N/A;  . Peripheral vascular catheterization N/A 08/29/2015    Procedure: Abdominal Aortogram;  Surgeon: Angelia Mould, MD;  Location: [place] CV LAB;  Service: Cardiovascular;  Laterality: N/A;  . Lower extremity angiogram Bilateral 08/29/2015    Procedure: Lower Extremity Angiogram;  Surgeon: Angelia Mould, MD;  Location: [facility]  INVASIVE CV LAB;  Service: Cardiovascular;  Laterality: Bilateral;  . Femoral-popliteal bypass graft Left 08/30/2015    Procedure: REVISION OF BYPASS GRAFT Left  FEMORAL-POPLITEAL ARTERY;  Surgeon: Angelia Mould, MD;  Location: [place];  Service: Vascular;  Laterality: Left;  . Patch angioplasty Left 08/30/2015    Procedure: VEIN PATCH ANGIOPLASTY OF PROXIMAL Left BYPASS GRAFT;  Surgeon: Angelia Mould, MD;  Location: [place];  Service: Vascular;  Laterality: Left;    SOCIAL HISTORY: Social History   Social History  . Marital Status: Married    Spouse Name: [name]   . Number of Children: 3  . Years of Education: 12+   Occupational History  . Not on file.   Social History Main Topics  . Smoking status: Former Smoker -- 0.50 packs/day for 50 years    Types: Cigarettes    Quit date: 07/29/2011  . Smokeless tobacco: Never Used  . Alcohol Use: 0.0 oz/week    0 Standard drinks or equivalent per week     Comment:  socially, variable  . Drug Use: No  . Sexual Activity: Not on file   Other Topics Concern  . Not on file   Social History Narrative   Patient lives at home with spouse [name]   Patient has 3  children    Patient is right handed     FAMILY HISTORY: Family History  Problem Relation Age of Onset  . Stroke Mother     mini strokes  . Alcohol abuse Father   . Heart disease Father     MI after 51  . Stroke Father   . Hypertension Father   . Heart attack Father   . Heart disease Paternal Aunt     several  . Hypertension Paternal Aunt     several  . Stroke Paternal Aunt     several  . Stroke Paternal Uncle     several  . Heart disease Paternal Uncle     several;2 had MI pre 62    ALLERGIES:  is allergic to penicillins; sulfonamide derivatives; niacin-lovastatin er; and atorvastatin.  MEDICATIONS:  Current Outpatient Prescriptions  Medication Sig Dispense Refill  . bimatoprost (LUMIGAN) 0.01 % SOLN Place 1 drop into the left eye at bedtime.    . Biotin 5000 MCG TABS Take 5,000 mcg by mouth daily.    . bisacodyl (DULCOLAX) 5 MG EC tablet Take 5 mg by mouth daily as needed for moderate constipation.    . brimonidine (ALPHAGAN P) 0.1 % SOLN Place 3 drops into the left eye 3 (three) times daily.     [name] CARDURA 4 MG tablet TAKE (1/2) TABLET DAILY. 45 tablet 2  . cilostazol (PLETAL) 100 MG tablet TAKE 1 TABLET TWICE DAILY. 180 tablet 1  . diltiazem (CARDIZEM CD) 120 MG 24 hr capsule TAKE 1 CAPSULE DAILY. 30 capsule 11  . diltiazem (CARDIZEM) 30 MG tablet Take 30 mg by mouth 4 (four) times daily. TAKE 1 TABLET ('30mg'$ ) BY MOUTH EVERY 4 HOURS AS NEEDED (HR>100 AND BP >100)    . dorzolamide-timolol (COSOPT) 22.3-6.8 MG/ML ophthalmic solution Place 1 drop into the left eye 3 (three) times daily.     [name] dronedarone (MULTAQ) 400 MG tablet Take 400 mg by mouth 2 (two) times daily with a meal.    . ELIQUIS 5 MG TABS tablet TAKE 1 TABLET TWICE DAILY. (Patient taking differently: TAKE 1 TABLET BY MOUTH TWICE DAILY) 180 tablet 3  . Ginkgo Biloba 40 MG TABS Take  40 mg by mouth daily.     . iron polysaccharides (POLY-IRON 150) 150 MG capsule Take 150 mg by mouth daily.    . Melatonin 5 MG  TABS Take 5 mg by mouth at bedtime.    . Multiple Vitamin (MULTIVITAMIN WITH MINERALS) TABS tablet Take 1 tablet by mouth daily. Centrum Silver    . ranitidine (ZANTAC) 150 MG tablet Take 150 mg by mouth daily as needed for heartburn.     . rosuvastatin (CRESTOR) 40 MG tablet TAKE 1 TABLET ONCE DAILY. (Patient taking differently: TAKE 40 MG BY MOUTH ONCE DAILY AT BEDTIME) 90 tablet 3  . FLUoxetine (PROZAC) 20 MG capsule TAKE (1) CAPSULE DAILY. 90 capsule 0   No current facility-administered medications for this visit.    REVIEW OF SYSTEMS:    10 Point review of Systems was done is negative except as noted above.  PHYSICAL EXAMINATION: ECOG PERFORMANCE STATUS: 2 - Symptomatic, <50% confined to bed  . Filed Vitals:   03/07/16 1139  BP: 139/60  Pulse: 67  Temp: 97.9 F (36.6 C)  Resp: 18   There were no vitals filed for this visit. .There is no weight on file to calculate BMI.  GENERAL:alert, in no acute distress and comfortable SKIN: skin color, texture, turgor are normal, no rashes or significant lesions EYES: normal, conjunctiva are pink and non-injected, sclera clear OROPHARYNX:no exudate, no erythema and lips, buccal mucosa, and tongue normal  NECK: supple, no JVD, thyroid normal size, non-tender, without nodularity LYMPH:  no palpable lymphadenopathy in the cervical, axillary or inguinal LUNGS - Decreased air entry bilaterally, no rales no rhonchiEART: regular rate & rhythm,  no murmurs and no lower extremity edema. ABDOMEN: abdomen soft, non-tender, normoactive bowel sounds  Musculoskeletal: no cyanosis of digits and no clubbing  PSYCH: alert & oriented x 3 with fluent speech NEURO: no focal motor/sensory deficits  LABORATORY DATA:  I have reviewed the data as listed  . CBC Latest Ref Rng & Units 03/07/2016 01/04/2016 08/31/2015  WBC 4.0 - 10.3 10e3/uL 7.0 6.6 8.4  Hemoglobin 13.0 - 17.1 g/dL 11.8(L) 12.6(L) 11.0(L)  Hematocrit 38.4 - 49.9 % 36.3(L) 38.4(L) 32.9(L)    Platelets 140 - 400 10e3/uL 248 284 213   . CBC    Component Value Date/Time   WBC 7.0 03/07/2016 1213   WBC 6.6 01/04/2016 0932   RBC 4.27 03/07/2016 1213   RBC 4.55 01/04/2016 0932   HGB 11.8 (L) 03/07/2016 1213   HCT 36.3 (L) 03/07/2016 1213   PLT 248 03/07/2016 1213   MCV 85.0 03/07/2016 1213   MCH 27.6 03/07/2016 1213   MCH 27.7 01/04/2016 0932   MCHC 32.5 03/07/2016 1213   MCHC 32.8 01/04/2016 0932   RDW 14.7 (H) 03/07/2016 1213   LYMPHSABS 0.8 (L) 03/07/2016 1213   MONOABS 0.8 03/07/2016 1213   EOSABS 0.1 03/07/2016 1213   BASOSABS 0.0 03/07/2016 1213    . CMP Latest Ref Rng & Units 03/07/2016 03/07/2016 01/04/2016  Glucose 70 - 140 mg/dl 85 - 118(H)  BUN 7.0 - 26.0 mg/dL 12.5 - 15  Creatinine 0.7 - 1.3 mg/dL 1.2 - 1.42(H)  Sodium 136 - 145 mEq/L 142 - 138  Potassium 3.5 - 5.1 mEq/L 4.5 - 4.3  Chloride 98 - 110 mmol/L - - 105  CO2 22 - 29 mEq/L 25 - 25  Calcium 8.4 - 10.4 mg/dL 9.8 - 9.3  Total Protein 6.0 - 8.5 g/dL 7.7 6.8 -  Total Bilirubin 0.20 - 1.20 mg/dL  0.50 - -  Alkaline Phos 40 - 150 U/L 75 - -  AST 5 - 34 U/L 18 - -  ALT 0 - 55 U/L 23 - -     RADIOGRAPHIC STUDIES: I have personally reviewed the radiological images as listed and agreed with the findings in the report. No results found.  ASSESSMENT & PLAN:   Mr Rumbold very pleasant 75 year old male with multiple medical comorbidities with   1) Stage IB (pT2a, pN0, M0) poorly differentiated grade 3 right lower lobe squamous cell carcinoma of the lung with lymphovascular invasion. Diagnosed in April 2012. 01/24/11 - Bronchoscopy, mediastinoscopy, right VATS lower lobectomy Pathology: Pathological stage T2a N0. 01/24/11 - 3. 7 cm poorly differentiated squamous cell carcinoma with positive vascular invasion. LN stations 4R, 4L, 7, 8, 9, 10, 11, and 12 negative for malignancy.  Adjuvant therapy: None  PLAN - patient has no clinical evidence of lung cancer recurrence at this time . He is now more than 5  years out from his diagnosis. -Labs today are stable.  -Patient has an appointment with Dr. Elenor Quinones at [facility] thoracic surgery on 04/12/2016 and he'll be getting his low-dose CT scan there for lung cancer imaging follow-up and has been seeing them yearly. -We will see him back in 12 months with labs after he has had his follow-up CT scan at [facility] in July 2018 . Earlier if any new questions or concerns .  2). Patient Active Problem List   Diagnosis Date Noted  . MGUS (monoclonal gammopathy of unknown significance) 03/07/2016  . Benign essential HTN 01/19/2016  . Atherosclerotic peripheral vascular disease ([place]) 08/29/2015  . OSA (obstructive sleep apnea) 08/29/2015  . PAF (paroxysmal atrial fibrillation) ([name])   . Glaucoma 06/22/2015  . Dyspnea 02/04/2015  . Memory loss, short term 08/03/2014  . Pain of right lower leg- and Left Thigh and leg 07/28/2014  . Anemia, unspecified 12/28/2013  . Carotid stenosis 07/24/2013  . Occlusion and stenosis of carotid artery without mention of cerebral infarction 07/09/2013  . COPD with emphysema ([place]) 07/09/2013  . Chronic renal disease, stage 3, moderately decreased glomerular filtration rate between 30-59 mL/min/1.73 square meter 07/06/2013  . TIA (transient ischemic attack) 07/06/2013  . Chest pain 12/01/2012  . Fatigue 12/01/2012  . Right carotid bruit 12/01/2012  . Compulsive tobacco user syndrome 10/16/2012  . Basal cell carcinoma of skin 10/10/2012  . History of lung cancer 03/11/2012  . Lung nodule, multiple 03/11/2012  . PAD (peripheral artery disease) ([place]) 12/16/2011  . At risk for coronary artery disease 12/16/2011  . AAA (abdominal aortic aneurysm) ([place]) 09/27/2011  . Squamous cell lung cancer ([place]) 11/08/2010  . GAIT IMBALANCE 03/02/2009  . FASTING HYPERGLYCEMIA 03/02/2009  . COLONIC POLYPS, HX OF 03/02/2009  . OBSTRUCTIVE CHRONIC BRONCHITIS WITHOUT EXACERBAT 01/15/2008  . HYPERPLASIA PROSTATE UNS W/O UR OBST & OTH LUTS 01/15/2008    . HYPERLIPIDEMIA 09/29/2007  . Peripheral vascular disease ([place]) 09/29/2007  . LEUKOPLAKIA, VOCAL CORDS 09/29/2007  . FEMORAL BRUIT 09/29/2007   -Continue follow-up with primary care physician for management of multiple medical comorbidities    All of the patients questions were answered with apparent satisfaction. The patient knows to call the clinic with any problems, questions or concerns.  I spent 40 minutes counseling the patient face to face. The total time spent in the appointment was 50 minutes and more than 50% was on counseling and direct patient cares.    Sullivan Lone MD MS AAHIVMS [facility] Hematology/Oncology Physician [facility]  [place]  (Office):       [phone] (Work cell):  [phone] (Fax):           [phone]

## 2016-05-08 ENCOUNTER — Ambulatory Visit (INDEPENDENT_AMBULATORY_CARE_PROVIDER_SITE_OTHER): Payer: BLUE CROSS/BLUE SHIELD | Admitting: *Deleted

## 2016-05-08 DIAGNOSIS — I480 Paroxysmal atrial fibrillation: Secondary | ICD-10-CM | POA: Diagnosis not present

## 2016-05-08 NOTE — Progress Notes (Signed)
Carelink Summary Report / Loop Recorder 

## 2016-05-17 LAB — CUP PACEART REMOTE DEVICE CHECK: MDC IDC SESS DTM: 20170709150610

## 2016-05-30 LAB — CUP PACEART REMOTE DEVICE CHECK: Date Time Interrogation Session: 20170808150621

## 2016-05-30 NOTE — Progress Notes (Signed)
Carelink summary report received. Battery status OK. Normal device function. No new symptom episodes, tachy episodes, brady, or pause episodes. 96 AF 9.6% +Eliquis. Monthly summary reports and ROV/PRN

## 2016-06-07 ENCOUNTER — Ambulatory Visit (INDEPENDENT_AMBULATORY_CARE_PROVIDER_SITE_OTHER): Payer: BLUE CROSS/BLUE SHIELD | Admitting: *Deleted

## 2016-06-07 DIAGNOSIS — I480 Paroxysmal atrial fibrillation: Secondary | ICD-10-CM | POA: Diagnosis not present

## 2016-06-07 NOTE — Progress Notes (Signed)
Carelink Summary Report / Loop Recorder 

## 2016-06-13 ENCOUNTER — Encounter: Payer: Self-pay | Admitting: Cardiology

## 2016-06-27 ENCOUNTER — Ambulatory Visit (INDEPENDENT_AMBULATORY_CARE_PROVIDER_SITE_OTHER): Payer: BLUE CROSS/BLUE SHIELD | Admitting: Cardiology

## 2016-06-27 VITALS — BP 136/70 | HR 62 | Ht 73.0 in | Wt 207.0 lb

## 2016-06-27 DIAGNOSIS — I739 Peripheral vascular disease, unspecified: Secondary | ICD-10-CM

## 2016-06-27 DIAGNOSIS — Z87891 Personal history of nicotine dependence: Secondary | ICD-10-CM

## 2016-06-27 DIAGNOSIS — I480 Paroxysmal atrial fibrillation: Secondary | ICD-10-CM

## 2016-06-27 DIAGNOSIS — G4733 Obstructive sleep apnea (adult) (pediatric): Secondary | ICD-10-CM

## 2016-06-27 DIAGNOSIS — E782 Mixed hyperlipidemia: Secondary | ICD-10-CM

## 2016-06-27 LAB — CBC WITH DIFFERENTIAL/PLATELET
Basophils Absolute: 0 {cells}/uL (ref 0–200)
Basophils Relative: 0 %
Eosinophils Absolute: 108 {cells}/uL (ref 15–500)
Eosinophils Relative: 2 %
HCT: 38.7 % (ref 38.5–50.0)
Hemoglobin: 12.8 g/dL — ABNORMAL LOW (ref 13.2–17.1)
Lymphocytes Relative: 19 %
Lymphs Abs: 1026 {cells}/uL (ref 850–3900)
MCH: 28.4 pg (ref 27.0–33.0)
MCHC: 33.1 g/dL (ref 32.0–36.0)
MCV: 86 fL (ref 80.0–100.0)
MPV: 9.3 fL (ref 7.5–12.5)
Monocytes Absolute: 648 {cells}/uL (ref 200–950)
Monocytes Relative: 12 %
Neutro Abs: 3618 {cells}/uL (ref 1500–7800)
Neutrophils Relative %: 67 %
Platelets: 256 K/uL (ref 140–400)
RBC: 4.5 MIL/uL (ref 4.20–5.80)
RDW: 15.3 % — ABNORMAL HIGH (ref 11.0–15.0)
WBC: 5.4 K/uL (ref 3.8–10.8)

## 2016-06-27 LAB — BASIC METABOLIC PANEL
BUN: 18 mg/dL (ref 7–25)
CHLORIDE: 105 mmol/L (ref 98–110)
CO2: 28 mmol/L (ref 20–31)
CREATININE: 1.25 mg/dL — AB (ref 0.70–1.18)
Calcium: 9 mg/dL (ref 8.6–10.3)
Glucose, Bld: 83 mg/dL (ref 65–99)
POTASSIUM: 4 mmol/L (ref 3.5–5.3)
Sodium: 141 mmol/L (ref 135–146)

## 2016-06-27 NOTE — Patient Instructions (Signed)
Medication Instructions:  Your physician recommends that you continue on your current medications as directed. Please refer to the Current Medication list given to you today.   Labwork: BMET/CBCd today  Testing/Procedures: None   Follow-Up: Your physician wants you to follow-up in: 6 months with Dr Aundra Dubin in the [facility] at [place] (March 2018)You will receive a reminder letter in the mail two months in advance. If you don't receive a letter, please call our office to schedule the follow-up appointment.       If you need a refill on your cardiac medications before your next appointment, please call your pharmacy.

## 2016-06-28 ENCOUNTER — Encounter: Payer: Self-pay | Admitting: Cardiology

## 2016-06-28 NOTE — Progress Notes (Signed)
Patient ID: Troy Kenner [name], male   DOB: 11/13/1940, 75 y.o.   MRN: [id number] PCP: Dr. Quay Burow  75 yo with history of HTN, COPD, active smoking/COPD, carotid stenosis s/p right CEA, paroxysmal atrial fibrillation, and PAD presents for cardiology followup.  He does not have known CAD but is at high risk for CAD based on his comorbidities.  Lexiscan Cardiolite in 3/14 showed no ischemia or infarction and echo in 3/14 showed normal EF.  He had a left fem-pop bypass at the [facility] in the '90s.  He is followed at [facility] for [facility]AD, last ABIs in 3/16 showed mild disease on the left and moderate disease on the right (unchanged). He has chronic right calf/thigh/buttocks claudication that is unchanged over the last few years and follows regularly at [facility].  He has had right lower lobectomy for lung cancer.  He continues to stay off cigarettes.    At a prior appointment, he was in atrial fibrillation but did not realize it.  I started him on Eliquis and diltiazem CD, and he spontaneously converted to NSR.  In 5/15, he was at [facility] and felt "strange" one day: fatigued, weak, short of breath.  He went to the ER and was in atrial fibrillation with HR in 80s-90s.  He spontaneously converted to NSR in the ER.  He felt back to normal after converting to NSR.  I started him on Multaq 400 mg bid.    He had patch angioplasty revision of left fem-pop bypass in 11/16.  Now with minimal claudication.  He follows with [facility].  He has been doing well recently.  Golfing once a week and going to gym to work out with trainer.  No tachypalpitations.  Mild dyspnea with moderate to heavy exertion.  No BRBPR or melena.  He had a Cardiolite and echo in 7/17 that were unremarkable.    Labs (3/13): LDL 75, HDL 35, K 4.1, creatinine 1.1 Labs (3/14): LDL 91, HDL 27 Labs (10/14): K 4.3, creatinine 0.98 Labs (4/15): K 4.9, creatinine 1.1, LDL 76, HDL 30 Labs (5/15): K 4.3, creatinine 1.7, BNP 251 Labs (6/15): K 4.4, creatinine 1.3 Labs  (10/15): TSH normal Labs (5/16): K 4.2, creatinine 1.12, HCT 39.5, LFTs normal, LDL 84, HDL 43, TSH normal Labs (11/16): creatinine 0.94 Labs (4/17): LDL 39, HDL 39 Labs (6/17): K 4.5, creatinine 1.2  ECG: NSR, inferior T wave inversions, QTc 420 msec  PMH: 1. HTN: Fatigue and cough with ramipril use.  2. COPD: Quit smoking 2014.  3. AAA: CT 1/13 with 3.0 cm AAA.  Abdominal US (1/14) with 3.4 cm AAA. Abdominal US (4/15) with 3.25 x 3.27 AAA. Abdominal US (3/16) with 3.7 cm AAA.   4. Squamous cell lung cancer diagnosed 2/12.  Had right lower lobectomy in 4/12.  5. Hyperlipidemia 6. PAD: Left fem-pop bypass 1994.  ABIs (2/12) 0.62 on right, 0.95 on left. ABIs (1/14): 0.65 on right, 1.04 on left. ABIs (4/15) 0.66 right, 0.87 left.  ABIs (3/16) 0.6 right, 0.88 left.  Patch angioplasty left fem-pop bypass in 11/16.   7. Stress myoview 2004 was normal. Lexiscan Cardiolite (3/14) with EF 66%, no ischemia or infarction. Lexiscan Cardiolite (7/17) with EF 61%, no ischemia/infarction.  8. Echo (3/14): technically difficult with EF 55-60%, upper normal RV size.  Echo (5/16) with EF 60-65%.  Echo (7/17) with EF 55-60%, normal RV size and systolic function.  9. Carotid stenosis: TIA 10/14.  Carotid dopplers with > 54% RICA, 00-86% LICA.  Patient  had right CEA in 10/14. Carotids (4/15) patent right CEA, LICA 42-70% stenosis.  Carotids (62/37) < 62% LICA, patent right CEA.  10. Atrial fibrillation: Paroxysmal. First noted after lobectomy in 4/12 (brief), recurrence in 4/15 then in 5/15.  11. Spinal stenosis 12. OSA: Using CPAP.  32. Glaucoma  SH: Married, lives in [place].  [place].  Quit smoking in 2014.  Rare ETOH now.   FH: No premature CAD  ROS: All systems reviewed and negative except as per HPI.   Current Outpatient Prescriptions  Medication Sig Dispense Refill  . bimatoprost (LUMIGAN) 0.01 % SOLN Place 1 drop into the left eye at bedtime.    . Biotin 5000 MCG TABS Take  5,000 mcg by mouth daily.    . bisacodyl (DULCOLAX) 5 MG EC tablet Take 5 mg by mouth daily as needed for moderate constipation.    . brimonidine (ALPHAGAN P) 0.1 % SOLN Place 3 drops into the left eye 3 (three) times daily.     [name] CARDURA 4 MG tablet TAKE (1/2) TABLET DAILY. 45 tablet 2  . cilostazol (PLETAL) 100 MG tablet TAKE 1 TABLET TWICE DAILY. 180 tablet 1  . diltiazem (CARDIZEM CD) 120 MG 24 hr capsule TAKE 1 CAPSULE DAILY. 30 capsule 11  . diltiazem (CARDIZEM) 30 MG tablet Take 30 mg by mouth 4 (four) times daily. TAKE 1 TABLET ('30mg'$ ) BY MOUTH EVERY 4 HOURS AS NEEDED (HR>100 AND BP >100)    . dorzolamide-timolol (COSOPT) 22.3-6.8 MG/ML ophthalmic solution Place 1 drop into the left eye 3 (three) times daily.     [name] dronedarone (MULTAQ) 400 MG tablet Take 400 mg by mouth 2 (two) times daily with a meal.    . ELIQUIS 5 MG TABS tablet TAKE 1 TABLET TWICE DAILY. (Patient taking differently: TAKE 1 TABLET BY MOUTH TWICE DAILY) 180 tablet 3  . FLUoxetine (PROZAC) 20 MG capsule TAKE (1) CAPSULE DAILY. 90 capsule 0  . Ginkgo Biloba 40 MG TABS Take 40 mg by mouth daily.     . iron polysaccharides (POLY-IRON 150) 150 MG capsule Take 150 mg by mouth daily.    . Melatonin 5 MG TABS Take 5 mg by mouth at bedtime.    . Multiple Vitamin (MULTIVITAMIN WITH MINERALS) TABS tablet Take 1 tablet by mouth daily. Centrum Silver    . ranitidine (ZANTAC) 150 MG tablet Take 150 mg by mouth daily as needed for heartburn.     . rosuvastatin (CRESTOR) 40 MG tablet TAKE 1 TABLET ONCE DAILY. (Patient taking differently: TAKE 40 MG BY MOUTH ONCE DAILY AT BEDTIME) 90 tablet 3   No current facility-administered medications for this visit.     BP 136/70   Pulse 62   Ht '6\' 1"'$  (1.854 m)   Wt 207 lb (93.9 kg)   BMI 27.31 kg/m  General: NAD Neck: JVP 7 cm, no thyromegaly or thyroid nodule.  Lungs: Mildly decreased breath sounds right base.  CV: Nondisplaced PMI.  Heart regular S1/S2, no S3/S4, 1/6 SEM RUSB.  No  edema.  Bilateral carotid bruits. Difficult to palpate PT pulses.   Abdomen: Soft, nontender, no hepatosplenomegaly, no distention.   Neurologic: Alert and oriented x 3.  Psych: Normal affect. Extremities: No clubbing or cyanosis.   Assessment/Plan: 1. Chest pain: He has had no further chest pain.  Lexiscan Cardiolite was negative in 7/17 and EF was preserved on echo.   2. Hyperlipidemia: Continue Crestor.  Good lipids in 4/17.   3. Carotid stenosis:  s/p R CEA.  Will follow with [facility] for residual LICA stenosis.  4. PAD: s/p patch angioplasty to left fem-pop bypass in 11/16.  Less claudication than in the past. Followed at [facility]. He is on cilostazol. He knows to try to walk through the pain for at least a short distance.   5. AAA: Stable on recent US.    6. COPD: Dyspnea improved now that he has stopped smoking.   7. Atrial fibrillation: Patient remains in NSR on dronedarone today.  CHADSVASC 3 (age, vascular disease, HTN).  He is also on diltiazem CD 120 mg daily.   - Limit ETOH.  - Use CPAP.  - Continue Eliquis, check CBC today.  8. CKD: BMET today.   9. Fatigue: Much improved off ramipril and with use of CPAP.    Followup in 6 months, he will [facility].    [name] 06/28/2016

## 2016-07-04 ENCOUNTER — Other Ambulatory Visit: Payer: Self-pay | Admitting: Internal Medicine

## 2016-07-07 LAB — CUP PACEART REMOTE DEVICE CHECK: Date Time Interrogation Session: 20170907150819

## 2016-07-07 NOTE — Progress Notes (Signed)
Carelink summary report received. Battery status OK. Normal device function. No new symptom episodes, tachy episodes, brady, or pause episodes. 9.2% AF, V rates controlled, +Eliquis. Monthly summary reports and ROV/PRN

## 2016-07-09 ENCOUNTER — Ambulatory Visit (INDEPENDENT_AMBULATORY_CARE_PROVIDER_SITE_OTHER): Payer: BLUE CROSS/BLUE SHIELD | Admitting: *Deleted

## 2016-07-09 DIAGNOSIS — I480 Paroxysmal atrial fibrillation: Secondary | ICD-10-CM | POA: Diagnosis not present

## 2016-07-09 NOTE — Progress Notes (Signed)
Carelink Summary Report / Loop Recorder 

## 2016-08-06 ENCOUNTER — Ambulatory Visit (INDEPENDENT_AMBULATORY_CARE_PROVIDER_SITE_OTHER): Payer: BLUE CROSS/BLUE SHIELD | Admitting: *Deleted

## 2016-08-06 DIAGNOSIS — I480 Paroxysmal atrial fibrillation: Secondary | ICD-10-CM

## 2016-08-07 NOTE — Progress Notes (Signed)
Carelink Summary Report / Loop Recorder 

## 2016-08-12 LAB — CUP PACEART REMOTE DEVICE CHECK
Implantable Pulse Generator Implant Date: 20161111
MDC IDC SESS DTM: 20171007150745

## 2016-08-12 NOTE — Progress Notes (Signed)
Carelink summary report received. Battery status OK. Normal device function. No new symptom episodes, tachy episodes, brady, or pause episodes. No new AF episodes. Monthly summary reports and ROV/PRN 

## 2016-08-27 ENCOUNTER — Other Ambulatory Visit: Payer: Self-pay | Admitting: Cardiology

## 2016-09-04 ENCOUNTER — Other Ambulatory Visit: Payer: Self-pay | Admitting: Cardiology

## 2016-09-04 ENCOUNTER — Ambulatory Visit (INDEPENDENT_AMBULATORY_CARE_PROVIDER_SITE_OTHER): Payer: BLUE CROSS/BLUE SHIELD | Admitting: *Deleted

## 2016-09-04 DIAGNOSIS — I480 Paroxysmal atrial fibrillation: Secondary | ICD-10-CM

## 2016-09-05 ENCOUNTER — Telehealth: Payer: Self-pay | Admitting: Cardiology

## 2016-09-05 NOTE — Telephone Encounter (Signed)
Patient SOB on exertion for week. Denies any other symptoms such as chest pain or edema. Patient's wife convinced patient to call into office to schedule an appointment with [name] for further evaluation. [name] has an opening on 12/14 but slots are on hold for [facility]. Routing to [name] for approval.

## 2016-09-05 NOTE — Progress Notes (Signed)
Carelink Summary Report / Loop Recorder 

## 2016-09-05 NOTE — Telephone Encounter (Signed)
Bring him in on 12/14 to see me.  Have him go ahead and get ECG prior, though, to see if he is in atrial fibrillation.

## 2016-09-05 NOTE — Telephone Encounter (Signed)
Pt c/o Shortness Of Breath: STAT if SOB developed within the last 24 hours or pt is noticeably SOB on the phone  1. Are you currently SOB (can you hear that pt is SOB on the phone)?yes  2. How long have you been experiencing SOB? About a week  3. Are you SOB when sitting or when up moving around? Moving around   4. Are you currently experiencing any other symptoms? no

## 2016-09-05 NOTE — Telephone Encounter (Signed)
Go ahead and bring him in this week to get an ECG.  No need to wait until 12/12.  I want him to get an ECG asap since he is having symptoms now.

## 2016-09-05 NOTE — Telephone Encounter (Signed)
Per [name] scheduled patient for an office visit with [name] on 09/13/16 at [place] and scheduled a nurse visit on 09/11/16 at 2pm for an EKG (approved by [facility]).

## 2016-09-06 ENCOUNTER — Encounter: Payer: Self-pay | Admitting: *Deleted

## 2016-09-06 ENCOUNTER — Other Ambulatory Visit: Payer: Self-pay | Admitting: Cardiology

## 2016-09-06 NOTE — Telephone Encounter (Signed)
Called and wife said patient is out of town until late Friday evening and that his symptoms have not worsened. She wants to know if they can keep the appointment for the EKG on Tuesday.

## 2016-09-06 NOTE — Telephone Encounter (Signed)
That is fine but would have preferred him getting an ECG on the day he called or the day after.

## 2016-09-11 ENCOUNTER — Ambulatory Visit (INDEPENDENT_AMBULATORY_CARE_PROVIDER_SITE_OTHER): Payer: BLUE CROSS/BLUE SHIELD | Admitting: *Deleted

## 2016-09-11 DIAGNOSIS — I480 Paroxysmal atrial fibrillation: Secondary | ICD-10-CM

## 2016-09-13 ENCOUNTER — Ambulatory Visit (INDEPENDENT_AMBULATORY_CARE_PROVIDER_SITE_OTHER): Payer: BLUE CROSS/BLUE SHIELD | Admitting: Cardiology

## 2016-09-13 ENCOUNTER — Encounter: Payer: Self-pay | Admitting: Cardiology

## 2016-09-13 VITALS — BP 120/54 | HR 68 | Ht 73.0 in | Wt 213.0 lb

## 2016-09-13 DIAGNOSIS — I480 Paroxysmal atrial fibrillation: Secondary | ICD-10-CM | POA: Diagnosis not present

## 2016-09-13 DIAGNOSIS — I739 Peripheral vascular disease, unspecified: Secondary | ICD-10-CM | POA: Diagnosis not present

## 2016-09-13 DIAGNOSIS — R0602 Shortness of breath: Secondary | ICD-10-CM | POA: Diagnosis not present

## 2016-09-13 DIAGNOSIS — I509 Heart failure, unspecified: Secondary | ICD-10-CM | POA: Diagnosis not present

## 2016-09-13 LAB — BASIC METABOLIC PANEL
BUN: 21 mg/dL (ref 7–25)
CHLORIDE: 111 mmol/L — AB (ref 98–110)
CO2: 24 mmol/L (ref 20–31)
Calcium: 8.9 mg/dL (ref 8.6–10.3)
Creat: 1.49 mg/dL — ABNORMAL HIGH (ref 0.70–1.18)
GLUCOSE: 103 mg/dL — AB (ref 65–99)
POTASSIUM: 4.5 mmol/L (ref 3.5–5.3)
Sodium: 140 mmol/L (ref 135–146)

## 2016-09-13 MED ORDER — POTASSIUM CHLORIDE CRYS ER 20 MEQ PO TBCR: 20.0000 meq | EXTENDED_RELEASE_TABLET | Freq: Every day | ORAL | 1 refills | Status: DC

## 2016-09-13 MED ORDER — FUROSEMIDE 40 MG PO TABS: 40.0000 mg | ORAL_TABLET | Freq: Every day | ORAL | 1 refills | Status: DC

## 2016-09-13 NOTE — Patient Instructions (Addendum)
Medication Instructions:  Your physician recommends that you continue on your current medications as directed. Please refer to the Current Medication list given to you today.   Labwork: None ordered   Testing/Procedures: None ordered   Follow-Up: Your physician recommends that you schedule a follow-up appointment as scheduled   Any Other Special Instructions Will Be Listed Below (If Applicable).     If you need a refill on your cardiac medications before your next appointment, please call your pharmacy.

## 2016-09-13 NOTE — Progress Notes (Signed)
Patient ID: Troy Kenner [name], male   DOB: 04-16-1941, 75 y.o.   MRN: [id number] PCP: Dr. Quay Burow  75 yo with history of HTN, COPD, active smoking/COPD, carotid stenosis s/p right CEA, paroxysmal atrial fibrillation, and PAD presents for cardiology followup.  He does not have known CAD but is at high risk for CAD based on his comorbidities.  Lexiscan Cardiolite in 3/14 showed no ischemia or infarction and echo in 3/14 showed normal EF.  He had a left fem-pop bypass at the [facility] in the '90s.  He is followed at [facility] for PAD. He has chronic right calf/thigh/buttocks claudication that is unchanged over the last few years and follows regularly at [facility].  He has had right lower lobectomy for lung cancer.  He continues to stay off cigarettes.    At a prior appointment, he was in atrial fibrillation but did not realize it.  I started him on Eliquis and diltiazem CD, and he spontaneously converted to NSR.  In 5/15, he was at [facility] and felt "strange" one day: fatigued, weak, short of breath.  He went to the ER and was in atrial fibrillation with HR in 80s-90s.  He spontaneously converted to NSR in the ER.  He felt back to normal after converting to NSR.  I started him on Multaq 400 mg bid.    He had patch angioplasty revision of left fem-pop bypass in 11/16.  Now with minimal claudication.  He follows with [facility].   He had a Cardiolite and echo in 7/17 that were unremarkable.    Over the last 3-4 weeks, he has had a noticeable worsening of functional capacity.  He is short of breath walking up stairs.  He is short of breath walking longer distances (> 100 yards or so).  He has been wheezing.  Weight is up 6 lbs since last appointment.  No orthopnea/PND.  No palpitations.  He is in NSR today.  No fever/cough.  No chest pain. He has general fatigue.  This represents a major change from about a month ago.   ECG: NSR, inferior TWIs  Labs (3/13): LDL 75, HDL 35, K 4.1, creatinine 1.1 Labs (3/14): LDL 91, HDL  27 Labs (10/14): K 4.3, creatinine 0.98 Labs (4/15): K 4.9, creatinine 1.1, LDL 76, HDL 30 Labs (5/15): K 4.3, creatinine 1.7, BNP 251 Labs (6/15): K 4.4, creatinine 1.3 Labs (10/15): TSH normal Labs (5/16): K 4.2, creatinine 1.12, HCT 39.5, LFTs normal, LDL 84, HDL 43, TSH normal Labs (11/16): creatinine 0.94 Labs (4/17): LDL 39, HDL 39 Labs (6/17): K 4.5, creatinine 1.2 Labs (9/17): K 4, creatinine 1.25, HCT 38.7  PMH: 1. HTN: Fatigue and cough with ramipril use.  2. COPD: Quit smoking 2014.  3. AAA: CT 1/13 with 3.0 cm AAA.  Abdominal US (1/14) with 3.4 cm AAA. Abdominal US (4/15) with 3.25 x 3.27 AAA. Abdominal US (3/16) with 3.7 cm AAA.   4. Squamous cell lung cancer diagnosed 2/12.  Had right lower lobectomy in 4/12.  5. Hyperlipidemia 6. PAD: Left fem-pop bypass 1994.  ABIs (2/12) 0.62 on right, 0.95 on left. ABIs (1/14): 0.65 on right, 1.04 on left. ABIs (4/15) 0.66 right, 0.87 left.  ABIs (3/16) 0.6 right, 0.88 left.  Patch angioplasty left fem-pop bypass in 11/16.   7. Stress myoview 2004 was normal. Lexiscan Cardiolite (3/14) with EF 66%, no ischemia or infarction. Lexiscan Cardiolite (7/17) with EF 61%, no ischemia/infarction.  8. Echo (3/14): technically difficult with EF 55-60%, upper normal  RV size.  Echo (5/16) with EF 60-65%.  Echo (7/17) with EF 55-60%, normal RV size and systolic function.  9. Carotid stenosis: TIA 10/14.  Carotid dopplers with > 93% RICA, 26-71% LICA.  Patient had right CEA in 10/14. Carotids (4/15) patent right CEA, LICA 24-58% stenosis.  Carotids (09/98) < 33% LICA, patent right CEA.  10. Atrial fibrillation: Paroxysmal. First noted after lobectomy in 4/12 (brief), recurrence in 4/15 then in 5/15.  11. Spinal stenosis 12. OSA: Using CPAP.  56. Glaucoma  SH: Married, lives in [place].  [place].  Quit smoking in 2014.  Rare ETOH now.   FH: No premature CAD  ROS: All systems reviewed and negative except as per HPI.    Current Outpatient Prescriptions  Medication Sig Dispense Refill  . apixaban (ELIQUIS) 5 MG TABS tablet Take 1 tablet (5 mg total) by mouth 2 (two) times daily. 60 tablet 8  . bimatoprost (LUMIGAN) 0.01 % SOLN Place 1 drop into the left eye at bedtime.    . Biotin 5000 MCG TABS Take 5,000 mcg by mouth daily.    . bisacodyl (DULCOLAX) 5 MG EC tablet Take 5 mg by mouth daily as needed for moderate constipation.    . brimonidine (ALPHAGAN P) 0.1 % SOLN Place 3 drops into the left eye 3 (three) times daily.     [name] CARDURA 4 MG tablet TAKE (1/2) TABLET DAILY. 45 tablet 2  . cilostazol (PLETAL) 100 MG tablet Take 1 tablet (100 mg total) by mouth 2 (two) times daily. 180 tablet 2  . diltiazem (CARDIZEM CD) 120 MG 24 hr capsule TAKE 1 CAPSULE DAILY. 30 capsule 11  . diltiazem (CARDIZEM) 30 MG tablet Take 30 mg by mouth 4 (four) times daily. TAKE 1 TABLET ('30mg'$ ) BY MOUTH EVERY 4 HOURS AS NEEDED (HR>100 AND BP >100)    . dorzolamide-timolol (COSOPT) 22.3-6.8 MG/ML ophthalmic solution Place 1 drop into the left eye 3 (three) times daily.     [name] FLUoxetine (PROZAC) 20 MG capsule TAKE (1) CAPSULE DAILY. 90 capsule 0  . Ginkgo Biloba 40 MG TABS Take 40 mg by mouth daily.     . iron polysaccharides (POLY-IRON 150) 150 MG capsule Take 150 mg by mouth daily.    . Melatonin 5 MG TABS Take 5 mg by mouth at bedtime.    . MULTAQ 400 MG tablet TAKE 1 TABLET TWICE DAILY WITH A MEAL. 60 tablet 9  . Multiple Vitamin (MULTIVITAMIN WITH MINERALS) TABS tablet Take 1 tablet by mouth daily. Centrum Silver    . ranitidine (ZANTAC) 150 MG tablet Take 150 mg by mouth daily as needed for heartburn.     . rosuvastatin (CRESTOR) 40 MG tablet TAKE 1 TABLET ONCE DAILY. (Patient taking differently: TAKE 40 MG BY MOUTH ONCE DAILY AT BEDTIME) 90 tablet 3  . furosemide (LASIX) 40 MG tablet Take 1 tablet (40 mg total) by mouth daily. 30 tablet 1  . potassium chloride SA (KLOR-CON M20) 20 MEQ tablet Take 1 tablet (20 mEq total) by mouth  daily. 30 tablet 1   No current facility-administered medications for this visit.     BP (!) 120/54   Pulse 68   Ht '6\' 1"'$  (1.854 m)   Wt 213 lb (96.6 kg)   BMI 28.10 kg/m  General: NAD Neck: JVP 10-11 cm, no thyromegaly or thyroid nodule.  Lungs: Mildly decreased breath sounds right base.  CV: Nondisplaced PMI.  Heart regular S1/S2, no S3/S4, 1/6 SEM  RUSB.  1+ edema to knees bilaterally.  Bilateral carotid bruits. Difficult to palpate PT pulses.   Abdomen: Soft, nontender, no hepatosplenomegaly, no distention.   Neurologic: Alert and oriented x 3.  Psych: Normal affect. Extremities: No clubbing or cyanosis.   Assessment/Plan: 1. Exertional dyspnea: This is new over 3-4 weeks.  NYHA class III.  On exam, he is volume overloaded.  No chest pain.  - I will arrange for echocardiogram to reassess LV and RV systolic function.  - He is at high risk for CAD, I am concerned this could be the cause of his worsening symptoms.  I will arrange for Lexiscan Cardiolite.  - Start Lasix 40 mg daily and KCl 20 daily.  BMET/BNP today and repeat at folllowup in 1 week.   2. Hyperlipidemia: Continue Crestor.  Good lipids in 4/17.   3. Carotid stenosis: s/p R CEA.  Will follow with [facility] for residual LICA stenosis.  4. PAD: s/p patch angioplasty to left fem-pop bypass in 11/16.  Less claudication than in the past. Followed at [facility]. He is on cilostazol => however, he will likely need stop this with the onset of CHF.  Will reassess at visit next week. He knows to try to walk through the pain for at least a short distance.   5. AAA: Stable on recent US.    6. COPD: No longer smoking. I do not think that this explains his new symptoms.   7. Atrial fibrillation: Patient remains in NSR on dronedarone today.  CHADSVASC 3 (age, vascular disease, HTN).  He is also on diltiazem CD 120 mg daily.   - Limit ETOH.  - Use CPAP.  - Continue Eliquis, check CBC today. Needs to remember to take Eliquis twice a day (sometimes  misses evening).  We discussed switching over the Xarelto once daily but he wants to stay on Eliquis.  - With CHF, will need to stop dronedarone.  I may switch him over to amiodarone next appointment.  - If EF is low, will need to replace diltiazem with beta blocker.  8. CKD: BMET today.    Followup with me in 1 week.    [name] 09/13/2016

## 2016-09-13 NOTE — Patient Instructions (Addendum)
Medication Instructions:  Start lasix (furosemide) '40mg'$  daily.  Start KCL(potassium) 20 mEq daily  Labwork: BMET/BNP today  Testing/Procedures: Your physician has requested that you have an echocardiogram. Echocardiography is a painless test that uses sound waves to create images of your heart. It provides your doctor with information about the size and shape of your heart and how well your heart's chambers and valves are working. This procedure takes approximately one hour. There are no restrictions for this procedure. BEFORE APPT WITH DR [facility] 09/19/16    Your physician has requested that you have a lexiscan myoview. For further information please visit [email or web address]. Please follow instruction sheet, as given. BEFORE APPT WITH DR [facility] 09/19/16   Follow-Up: Your physician recommends that you schedule a follow-up appointment in: 1 week with Dr Rejeana Brock is scheduled for Wednesday December 20,2017 at 4:30PM       If you need a refill on your cardiac medications before your next appointment, please call your pharmacy.

## 2016-09-13 NOTE — Progress Notes (Signed)
Patient in for EKG per Dr Aundra Dubin

## 2016-09-14 ENCOUNTER — Telehealth (HOSPITAL_COMMUNITY): Payer: Self-pay | Admitting: *Deleted

## 2016-09-14 LAB — BRAIN NATRIURETIC PEPTIDE: Brain Natriuretic Peptide: 42.6 pg/mL (ref <100)

## 2016-09-14 NOTE — Telephone Encounter (Signed)
Patient given detailed instructions per Myocardial Perfusion Study Information Sheet for the test on 09/17/16 at 7:30. Patient notified to arrive 15 minutes early and that it is imperative to arrive on time for appointment to keep from having the test rescheduled.  If you need to cancel or reschedule your appointment, please call the office within 24 hours of your appointment. Failure to do so may result in a cancellation of your appointment, and a $50 no show fee. Patient verbalized understanding.Troy [name]

## 2016-09-17 ENCOUNTER — Other Ambulatory Visit: Payer: Self-pay | Admitting: Cardiology

## 2016-09-17 ENCOUNTER — Ambulatory Visit (HOSPITAL_COMMUNITY): Payer: BLUE CROSS/BLUE SHIELD | Attending: Internal Medicine

## 2016-09-17 ENCOUNTER — Ambulatory Visit (HOSPITAL_BASED_OUTPATIENT_CLINIC_OR_DEPARTMENT_OTHER): Payer: BLUE CROSS/BLUE SHIELD

## 2016-09-17 DIAGNOSIS — I509 Heart failure, unspecified: Secondary | ICD-10-CM

## 2016-09-17 DIAGNOSIS — R0602 Shortness of breath: Secondary | ICD-10-CM

## 2016-09-17 LAB — MYOCARDIAL PERFUSION IMAGING
CHL CUP NUCLEAR SDS: 3
CHL CUP NUCLEAR SRS: 3
CHL CUP NUCLEAR SSS: 4
LHR: 0.37
LV dias vol: 93 mL (ref 62–150)
LV sys vol: 36 mL
Peak HR: 79 {beats}/min
Rest HR: 60 {beats}/min
TID: 0.92

## 2016-09-17 LAB — ECHOCARDIOGRAM COMPLETE
Height: 73 in
Weight: 3408 oz

## 2016-09-17 MED ORDER — TECHNETIUM TC 99M TETROFOSMIN IV KIT
10.6000 | PACK | Freq: Once | INTRAVENOUS | Status: AC | PRN
  Administered 2016-09-17: 10.6 via INTRAVENOUS
  Filled 2016-09-17: qty 11

## 2016-09-17 MED ORDER — TECHNETIUM TC 99M TETROFOSMIN IV KIT
31.6000 | PACK | Freq: Once | INTRAVENOUS | Status: AC | PRN
  Administered 2016-09-17: 31.6 via INTRAVENOUS
  Filled 2016-09-17: qty 32

## 2016-09-17 MED ORDER — REGADENOSON 0.4 MG/5ML IV SOLN
0.4000 mg | Freq: Once | INTRAVENOUS | Status: AC
  Administered 2016-09-17: 0.4 mg via INTRAVENOUS

## 2016-09-17 NOTE — Progress Notes (Unsigned)
Technically Difficult echo, as documented in past.

## 2016-09-19 ENCOUNTER — Ambulatory Visit (HOSPITAL_COMMUNITY)
Admission: RE | Admit: 2016-09-19 | Discharge: 2016-09-19 | Disposition: A | Discharge status: Home / Self Care | Payer: BLUE CROSS/BLUE SHIELD | Source: Ambulatory Visit | Attending: Vascular Surgery | Admitting: Vascular Surgery

## 2016-09-19 ENCOUNTER — Ambulatory Visit (INDEPENDENT_AMBULATORY_CARE_PROVIDER_SITE_OTHER): Payer: BLUE CROSS/BLUE SHIELD | Admitting: Cardiology

## 2016-09-19 ENCOUNTER — Ambulatory Visit (INDEPENDENT_AMBULATORY_CARE_PROVIDER_SITE_OTHER)
Admission: RE | Admit: 2016-09-19 | Discharge: 2016-09-19 | Disposition: A | Discharge status: Home / Self Care | Payer: BLUE CROSS/BLUE SHIELD | Source: Ambulatory Visit | Attending: Vascular Surgery | Admitting: Vascular Surgery

## 2016-09-19 ENCOUNTER — Ambulatory Visit: Payer: BLUE CROSS/BLUE SHIELD | Admitting: Vascular Surgery

## 2016-09-19 ENCOUNTER — Encounter: Payer: Self-pay | Admitting: Cardiology

## 2016-09-19 VITALS — BP 137/64 | HR 60 | Ht 73.0 in | Wt 209.0 lb

## 2016-09-19 DIAGNOSIS — I5032 Chronic diastolic (congestive) heart failure: Secondary | ICD-10-CM | POA: Diagnosis not present

## 2016-09-19 DIAGNOSIS — Z48812 Encounter for surgical aftercare following surgery on the circulatory system: Secondary | ICD-10-CM

## 2016-09-19 DIAGNOSIS — I509 Heart failure, unspecified: Secondary | ICD-10-CM

## 2016-09-19 DIAGNOSIS — I480 Paroxysmal atrial fibrillation: Secondary | ICD-10-CM | POA: Diagnosis not present

## 2016-09-19 DIAGNOSIS — I739 Peripheral vascular disease, unspecified: Secondary | ICD-10-CM

## 2016-09-19 MED ORDER — AMIODARONE HCL 200 MG PO TABS: ORAL_TABLET | ORAL | 2 refills | Status: DC

## 2016-09-19 NOTE — Patient Instructions (Addendum)
Medication Instructions:  Stop Multaq.  Stop cilostazol(pletal).  DO Not start amiodarone until Friday morning ( morning Dec 22)  Start amiodarone '200mg'$  two times a day for 1 week, then decrease to amiodarone '200mg'$  daily. Stay on amiodarone '200mg'$  daily.  Labwork: Your physician recommends that you return for lab work tomorrow-December 21-CMET. The lab is open from 7:30AM-5PM.   Testing/Procedures: Schedule an appointment for a Cardiac MRI-this can be done in January 2018.  Your physician has recommended that you have a pulmonary function test. Pulmonary Function Tests are a group of tests that measure how well air moves in and out of your lungs.   Follow-Up: Your physician recommends that you schedule a follow-up appointment in: 2 months with Dr Aundra Dubin in the [facility] at [facility] [phone]        If you need a refill on your cardiac medications before your next appointment, please call your pharmacy.

## 2016-09-20 ENCOUNTER — Other Ambulatory Visit: Payer: BLUE CROSS/BLUE SHIELD | Admitting: *Deleted

## 2016-09-20 ENCOUNTER — Telehealth (HOSPITAL_COMMUNITY): Payer: Self-pay | Admitting: Vascular Surgery

## 2016-09-20 ENCOUNTER — Encounter: Payer: Self-pay | Admitting: Vascular Surgery

## 2016-09-20 LAB — COMPREHENSIVE METABOLIC PANEL
ALK PHOS: 72 U/L (ref 40–115)
ALT: 23 U/L (ref 9–46)
AST: 26 U/L (ref 10–35)
Albumin: 3.9 g/dL (ref 3.6–5.1)
BUN: 23 mg/dL (ref 7–25)
CO2: 28 mmol/L (ref 20–31)
Calcium: 9.3 mg/dL (ref 8.6–10.3)
Chloride: 102 mmol/L (ref 98–110)
Creat: 1.62 mg/dL — ABNORMAL HIGH (ref 0.70–1.18)
GLUCOSE: 170 mg/dL — AB (ref 65–99)
POTASSIUM: 4.2 mmol/L (ref 3.5–5.3)
SODIUM: 139 mmol/L (ref 135–146)
TOTAL PROTEIN: 6.7 g/dL (ref 6.1–8.1)
Total Bilirubin: 0.5 mg/dL (ref 0.2–1.2)

## 2016-09-20 NOTE — Telephone Encounter (Signed)
Left pt message to make f/u appt w/ [facility] in 2 months

## 2016-09-21 ENCOUNTER — Telehealth: Payer: Self-pay | Admitting: *Deleted

## 2016-09-21 ENCOUNTER — Telehealth: Payer: Self-pay

## 2016-09-21 DIAGNOSIS — R0602 Shortness of breath: Secondary | ICD-10-CM

## 2016-09-21 DIAGNOSIS — I509 Heart failure, unspecified: Secondary | ICD-10-CM

## 2016-09-21 LAB — CUP PACEART REMOTE DEVICE CHECK
Date Time Interrogation Session: 20171106161346
MDC IDC PG IMPLANT DT: 20161111

## 2016-09-21 MED ORDER — FUROSEMIDE 40 MG PO TABS: ORAL_TABLET | ORAL | 1 refills | Status: DC

## 2016-09-21 NOTE — Telephone Encounter (Signed)
pt aware of results and medication changes

## 2016-09-21 NOTE — Telephone Encounter (Signed)
Patient aware of lab results and change to his lasix medication.

## 2016-09-21 NOTE — Progress Notes (Signed)
Patient ID: Troy Kenner [name], male   DOB: August 06, 1941, 75 y.o.   MRN: [id number] PCP: Dr. Quay [name]  75 yo with history of HTN, COPD, active smoking/COPD, carotid stenosis s/p right CEA, paroxysmal atrial fibrillation, and PAD presents for cardiology followup.  He does not have known CAD but is at high risk for CAD based on his comorbidities.  Lexiscan Cardiolite in 3/14 showed no ischemia or infarction and echo in 3/14 showed normal EF.  He had a left fem-pop bypass at the [facility] in the '90s.  He is followed at [facility] for PAD. He has chronic right calf/thigh/buttocks claudication that is unchanged over the last few years and follows regularly at [facility].  He has had right lower lobectomy for lung cancer.  He continues to stay off cigarettes.    At a prior appointment, he was in atrial fibrillation but did not realize it.  I started him on Eliquis and diltiazem CD, and he spontaneously converted to NSR.  In 5/15, he was at [facility] and felt "strange" one day: fatigued, weak, short of breath.  He went to the ER and was in atrial fibrillation with HR in 80s-90s.  He spontaneously converted to NSR in the ER.  He felt back to normal after converting to NSR.  I started him on Multaq 400 mg bid.    He had patch angioplasty revision of left fem-pop bypass in 11/16.  Now with minimal claudication.  He follows with [facility].   He had a Cardiolite and echo in 7/17 that were unremarkable.  Repeat Cardiolite in 12/17 showed no ischemia, echo was uninterpretable.   Prior to last appointment, Troy [name] had developed exertional dyspnea. He was volume overloaded on exam.  Lasix was started at 40 mg daily.  He has lost 12 lbs on his scales since that time.  Breathing is better, he thinks that it is back to normal.  No chest pain.  No palpitations.  He is in NSR today.    Labs (3/13): LDL 75, HDL 35, K 4.1, creatinine 1.1 Labs (3/14): LDL 91, HDL 27 Labs (10/14): K 4.3, creatinine 0.98 Labs (4/15): K 4.9, creatinine 1.1, LDL 76,  HDL 30 Labs (5/15): K 4.3, creatinine 1.7, BNP 251 Labs (6/15): K 4.4, creatinine 1.3 Labs (10/15): TSH normal Labs (5/16): K 4.2, creatinine 1.12, HCT 39.5, LFTs normal, LDL 84, HDL 43, TSH normal Labs (11/16): creatinine 0.94 Labs (4/17): LDL 39, HDL 39 Labs (6/17): K 4.5, creatinine 1.2 Labs (9/17): K 4, creatinine 1.25, HCT 38.7 Labs (12/17): K 4.5, creatinine 1.49, BNP 43  PMH: 1. HTN: Fatigue and cough with ramipril use.  2. COPD: Quit smoking 2014.  3. AAA: CT 1/13 with 3.0 cm AAA.  Abdominal US (1/14) with 3.4 cm AAA. Abdominal US (4/15) with 3.25 x 3.27 AAA. Abdominal US (3/16) with 3.7 cm AAA.   4. Squamous cell lung cancer diagnosed 2/12.  Had right lower lobectomy in 4/12.  5. Hyperlipidemia 6. PAD: Left fem-pop bypass 1994.  ABIs (2/12) 0.62 on right, 0.95 on left. ABIs (1/14): 0.65 on right, 1.04 on left. ABIs (4/15) 0.66 right, 0.87 left.  ABIs (3/16) 0.6 right, 0.88 left.  Patch angioplasty left fem-pop bypass in 11/16.   7. Stress myoview 2004 was normal. Lexiscan Cardiolite (3/14) with EF 66%, no ischemia or infarction. Lexiscan Cardiolite (7/17) with EF 61%, no ischemia/infarction.  - Cardiolite (12/17) with EF 62%, inferior/inferolateral fixed defect, most likely diaphragmatic attenuation, no ischemia.  8. Echo (3/14): technically  difficult with EF 55-60%, upper normal RV size.  Echo (5/16) with EF 60-65%.  Echo (7/17) with EF 55-60%, normal RV size and systolic function.  - Echo 12/17 with very poor windows, unable to comment on LV or RV function.  9. Carotid stenosis: TIA 10/14.  Carotid dopplers with > 72% RICA, 09-47% LICA.  Patient had right CEA in 10/14. Carotids (4/15) patent right CEA, LICA 09-62% stenosis.  Carotids (83/66) < 29% LICA, patent right CEA.  10. Atrial fibrillation: Paroxysmal. First noted after lobectomy in 4/12 (brief), recurrence in 4/15 then in 5/15.  11. Spinal stenosis 12. OSA: Using CPAP.  48. Glaucoma  SH: Married, lives in [place].   Former [place].  Quit smoking in 2014.  Rare ETOH now.   FH: No premature CAD  ROS: All systems reviewed and negative except as per HPI.   Current Outpatient Prescriptions  Medication Sig Dispense Refill  . apixaban (ELIQUIS) 5 MG TABS tablet Take 1 tablet (5 mg total) by mouth 2 (two) times daily. 60 tablet 8  . bimatoprost (LUMIGAN) 0.01 % SOLN Place 1 drop into the left eye at bedtime.    . Biotin 5000 MCG TABS Take 5,000 mcg by mouth daily.    . bisacodyl (DULCOLAX) 5 MG EC tablet Take 5 mg by mouth daily as needed for moderate constipation.    . brimonidine (ALPHAGAN P) 0.1 % SOLN Place 3 drops into the left eye 3 (three) times daily.     [name] CARDURA 4 MG tablet TAKE (1/2) TABLET DAILY. 45 tablet 2  . diltiazem (CARDIZEM CD) 120 MG 24 hr capsule TAKE 1 CAPSULE DAILY. 30 capsule 11  . diltiazem (CARDIZEM) 30 MG tablet Take 30 mg by mouth 4 (four) times daily. TAKE 1 TABLET ('30mg'$ ) BY MOUTH EVERY 4 HOURS AS NEEDED (HR>100 AND BP >100)    . dorzolamide-timolol (COSOPT) 22.3-6.8 MG/ML ophthalmic solution Place 1 drop into the left eye 3 (three) times daily.     [name] FLUoxetine (PROZAC) 20 MG capsule TAKE (1) CAPSULE DAILY. 90 capsule 0  . Ginkgo Biloba 40 MG TABS Take 40 mg by mouth daily.     . iron polysaccharides (POLY-IRON 150) 150 MG capsule Take 150 mg by mouth daily.    . Melatonin 5 MG TABS Take 5 mg by mouth at bedtime.    . Multiple Vitamin (MULTIVITAMIN WITH MINERALS) TABS tablet Take 1 tablet by mouth daily. Centrum Silver    . potassium chloride SA (KLOR-CON M20) 20 MEQ tablet Take 1 tablet (20 mEq total) by mouth daily. 30 tablet 1  . ranitidine (ZANTAC) 150 MG tablet Take 150 mg by mouth daily as needed for heartburn.     . rosuvastatin (CRESTOR) 40 MG tablet Take 1 tablet (40 mg total) by mouth daily. 90 tablet 3  . amiodarone (PACERONE) 200 MG tablet Take 1 tablet by mouth two times a day for 1 week, then decease to 1 tablet by mouth daily, stay on 1 tablet by  mouth daily 60 tablet 2  . furosemide (LASIX) 40 MG tablet Take 40 mg (1 tab) by mouth every other day, then take 20 (1/2 tab) mg by mouth alternating days 30 tablet 1   No current facility-administered medications for this visit.     BP 137/64   Pulse 60   Ht '6\' 1"'$  (1.854 m)   Wt 209 lb (94.8 kg)   BMI 27.57 kg/m  General: NAD Neck: No JVD, no thyromegaly or thyroid  nodule.  Lungs: Mildly decreased breath sounds right base.  CV: Nondisplaced PMI.  Heart regular S1/S2, no S3/S4, 1/6 SEM RUSB.  No edema.  Bilateral carotid bruits. Difficult to palpate PT pulses.   Abdomen: Soft, nontender, no hepatosplenomegaly, no distention.   Neurologic: Alert and oriented x 3.  Psych: Normal affect. Extremities: No clubbing or cyanosis.   Assessment/Plan: 1. Chronic diastolic CHF: Volume status much improved on Lasix.  EF normal on Cardiolite in 12/17, but echo was uninterpretable.   - Continue Lasix 40 mg daily, BMET today.  - I will arrange for cardiac MRI given uninterpretable echo for LV,RV and valvular evaluation.   2. Hyperlipidemia: Continue Crestor.  Good lipids in 4/17.   3. Carotid stenosis: s/p R CEA.  Will follow with [facility] for residual LICA stenosis.  4. PAD: s/p patch angioplasty to left fem-pop bypass in 11/16.  Less claudication than in the past. Followed at [facility]. He is on cilostazol => He will need to stop this with active CHF.    5. AAA: Stable on recent US.    6. COPD: No longer smoking. I do not think that this explains his new symptoms.   7. Atrial fibrillation: Patient remains in NSR on dronedarone today.  CHADSVASC 3 (age, vascular disease, HTN).  He is also on diltiazem CD 120 mg daily.   - Limit ETOH.  - Use CPAP.  - Continue Eliquis. - With active CHF, he will need to stop dronedarone. I will stop dronedarone and given significant symptoms when in atrial fibrillation, will have him start amiodarone 200 mg bid x 7 days then 200 mg daily.  Will get baseline PFTs.  I discussed  the possible side effects of amiodarone with him today. 8. CKD: BMET today.    Followup with me in CHF clinic in 2 months.    [name] 09/21/2016

## 2016-09-21 NOTE — Progress Notes (Signed)
Carelink summary report received. Battery status OK. Normal device function. No new symptom episodes, tachy episodes, brady, or pause episodes. 216 AF 18.# % +eliquis Monthly summary reports and ROV/PRN

## 2016-09-21 NOTE — Telephone Encounter (Signed)
-----   Message from Larey Dresser, MD sent at 09/20/2016 10:10 PM EST ----- Creatinine is a little higher.  Decrease Lasix to 40 mg daily alternating with 20 mg daily.

## 2016-09-26 ENCOUNTER — Ambulatory Visit (INDEPENDENT_AMBULATORY_CARE_PROVIDER_SITE_OTHER): Payer: BLUE CROSS/BLUE SHIELD | Admitting: Vascular Surgery

## 2016-09-26 ENCOUNTER — Encounter: Payer: Self-pay | Admitting: Vascular Surgery

## 2016-09-26 VITALS — BP 148/75 | HR 66 | Temp 97.1°F | Resp 18 | Ht 73.0 in | Wt 206.2 lb

## 2016-09-26 DIAGNOSIS — I714 Abdominal aortic aneurysm, without rupture, unspecified: Secondary | ICD-10-CM

## 2016-09-26 DIAGNOSIS — I6523 Occlusion and stenosis of bilateral carotid arteries: Secondary | ICD-10-CM

## 2016-09-26 DIAGNOSIS — I739 Peripheral vascular disease, unspecified: Secondary | ICD-10-CM

## 2016-09-26 NOTE — Progress Notes (Signed)
Patient name: Troy Cwikla [name] MRN: [id number] DOB: 23-Jun-1941 Sex: male  REASON FOR VISIT: Routine follow up visit.  HPI: Troy Brannigan [name] is a 75 y.o. male who I last saw in March of this year. He had undergone a left femoropopliteal bypass graft in [place]. He undergone revision of the distal anastomosis and most recently had developed a recurrent stenosis in the proximal graft. On 08/30/2015 I did a vein patch angioplasty of the proximal left femoropopliteal bypass graft using left great saphenous vein.  At the time of his last visit his ABI was 100% on the left and 63% on the right. He is also status post right carotid endarterectomy in October 2014. He was due for a carotid duplex scan.  He denies significant claudication, rest pain, or nonhealing ulcers.  He denies any history of stroke, TIAs, expressive or receptive aphasia, or amaurosis fugax.  He denies any abdominal pain or back pain.  Past Medical History:  Diagnosis Date  . AAA (abdominal aortic aneurysm) (HCC) LAST ABD. [name]  JAN 2014  3.4CM   MONITORED BY DR [name]  . Arthritis   . Atrial fibrillation ([place])   . Benign essential HTN 01/19/2016  . Bilateral carotid artery stenosis DUPLEX 12-29-2012  BY DR [facility]   BILATERAL ICA STENOSIS 60-79%  . BPH (benign prostatic hypertrophy)   . Cancer ([place]) 01-25-12   LUNG  . Complication of anesthesia    had nausea after carotid artery surgery, states the medicine given to help the nausea, made it worse  . COPD (chronic obstructive pulmonary disease) ([place])   . Exertional dyspnea   . Frequency of urination   . GERD (gastroesophageal reflux disease)   . Glaucoma BOTH EYES   Dr Gershon Crane  . History of basal cell carcinoma excision   . History of lung cancer APRIL 2012  SQUAMOUS CELL---- S/P RIGHT LOWER LOBECTOMY AT [facility] --  NO CHEMORADIATION---  NO RECURRENCE    ONCOLOGIST- DR Tressie Stalker  LOV IN [facility] 10-27-2012  . Hx of adenomatous colonic polyps 2005    X 2; 1 hyperplastic polyp; Dr  Olevia Perches  . Hyperlipidemia   . Hypertension   . Impaired fasting glucose 2007   108; A1c5.4%  . Lesion of bladder   . Microhematuria   . Nocturia   . OSA (obstructive sleep apnea) 08/29/2015   Mild with AHI overall 6/hr but severe during REM sleep with AHI 30/hr with oxygen saturations < 88% for 5.5 minutes of total sleep time.    [name] PAD (peripheral artery disease) ([place]) ABI'S  JAN 2014  0.65 ON RIGHT ;  1.04 ON LEFT  . Peripheral vascular disease ([place]) S/P ANGIOPLASTY AND STENTING   FOLLOWED  BY DR [name]  . Spinal stenosis Sept. 2015  . Stroke [facility]) Jul 07, 2013   mini  TIA  . Urgency of urination     Family History  Problem Relation Age of Onset  . Stroke Mother     mini strokes  . Alcohol abuse Father   . Heart disease Father     MI after 65  . Stroke Father   . Hypertension Father   . Heart attack Father   . Heart disease Paternal Aunt     several  . Hypertension Paternal Aunt     several  . Stroke Paternal Aunt     several  . Stroke Paternal Uncle     several  . Heart disease Paternal Uncle     several;2 had MI  pre 55    SOCIAL HISTORY: Social History  Substance Use Topics  . Smoking status: Former Smoker    Packs/day: 0.50    Years: 50.00    Types: Cigarettes    Quit date: 07/29/2011  . Smokeless tobacco: Never Used  . Alcohol use 0.0 oz/week     Comment:  socially, variable    Allergies  Allergen Reactions  . Penicillins Hives, Shortness Of Breath and Other (See Comments)    Flushing  & dyspnea Because of a history of documented adverse serious drug reaction;Medi Alert bracelet  is recommended PCN reaction causing immediate rash, facial/tongue/throat swelling, SOB or lightheadedness with hypotension: Yes PCN reaction causing severe rash involving mucus membranes or skin necrosis: No PCN reaction occurring within the last 10 years: NO PCN reaction that required hospitalization: NO  . Sulfonamide Derivatives Hives, Shortness Of Breath and Other (See  Comments)    Flushing & dyspnea Because of a history of documented adverse serious drug reaction;Medi Alert bracelet  is recommended  . Niacin-Lovastatin Er Other (See Comments)    Dyspnea, flushing  . Atorvastatin Other (See Comments)    Myalgias & athralgias- takes Crestor    Current Outpatient Prescriptions  Medication Sig Dispense Refill  . amiodarone (PACERONE) 200 MG tablet Take 1 tablet by mouth two times a day for 1 week, then decease to 1 tablet by mouth daily, stay on 1 tablet by mouth daily 60 tablet 2  . apixaban (ELIQUIS) 5 MG TABS tablet Take 1 tablet (5 mg total) by mouth 2 (two) times daily. 60 tablet 8  . bimatoprost (LUMIGAN) 0.01 % SOLN Place 1 drop into the left eye at bedtime.    . Biotin 5000 MCG TABS Take 5,000 mcg by mouth daily.    . bisacodyl (DULCOLAX) 5 MG EC tablet Take 5 mg by mouth daily as needed for moderate constipation.    . brimonidine (ALPHAGAN P) 0.1 % SOLN Place 3 drops into the left eye 3 (three) times daily.     [name] CARDURA 4 MG tablet TAKE (1/2) TABLET DAILY. 45 tablet 2  . diltiazem (CARDIZEM CD) 120 MG 24 hr capsule TAKE 1 CAPSULE DAILY. 30 capsule 11  . diltiazem (CARDIZEM) 30 MG tablet Take 30 mg by mouth 4 (four) times daily. TAKE 1 TABLET ('30mg'$ ) BY MOUTH EVERY 4 HOURS AS NEEDED (HR>100 AND BP >100)    . dorzolamide-timolol (COSOPT) 22.3-6.8 MG/ML ophthalmic solution Place 1 drop into the left eye 3 (three) times daily.     [name] FLUoxetine (PROZAC) 20 MG capsule TAKE (1) CAPSULE DAILY. 90 capsule 0  . furosemide (LASIX) 40 MG tablet Take 40 mg (1 tab) by mouth every other day, then take 20 (1/2 tab) mg by mouth alternating days 30 tablet 1  . Ginkgo Biloba 40 MG TABS Take 40 mg by mouth daily.     . iron polysaccharides (POLY-IRON 150) 150 MG capsule Take 150 mg by mouth daily.    . Melatonin 5 MG TABS Take 5 mg by mouth at bedtime.    . Multiple Vitamin (MULTIVITAMIN WITH MINERALS) TABS tablet Take 1 tablet by mouth daily. Centrum Silver    .  potassium chloride SA (KLOR-CON M20) 20 MEQ tablet Take 1 tablet (20 mEq total) by mouth daily. 30 tablet 1  . ranitidine (ZANTAC) 150 MG tablet Take 150 mg by mouth daily as needed for heartburn.     . rosuvastatin (CRESTOR) 40 MG tablet Take 1 tablet (40 mg total) by mouth  daily. 90 tablet 3   No current facility-administered medications for this visit.     REVIEW OF SYSTEMS:  '[X]'$  denotes positive finding, '[ ]'$  denotes negative finding Cardiac  Comments:  Chest pain or chest pressure:    Shortness of breath upon exertion: X   Short of breath when lying flat: X   Irregular heart rhythm:        Vascular    Pain in calf, thigh, or hip brought on by ambulation:    Pain in feet at night that wakes you up from your sleep:     Blood clot in your veins:    Leg swelling:  X       Pulmonary    Oxygen at home:    Productive cough:     Wheezing:  X       Neurologic    Sudden weakness in arms or legs:     Sudden numbness in arms or legs:     Sudden onset of difficulty speaking or slurred speech:    Temporary loss of vision in one eye:     Problems with dizziness:         Gastrointestinal    Blood in stool:     Vomited blood:         Genitourinary    Burning when urinating:     Blood in urine:        Psychiatric    Major depression:         Hematologic    Bleeding problems:    Problems with blood clotting too easily:        Skin    Rashes or ulcers:        Constitutional    Fever or chills:      PHYSICAL EXAM: Vitals:   09/26/16 1049 09/26/16 1052  BP: (!) 146/78 (!) 148/75  Pulse: 66   Resp: 18   Temp: 97.1 F (36.2 C)   TempSrc: Oral   SpO2: 96%   Weight: 206 lb 3.2 oz (93.5 kg)   Height: '6\' 1"'$  (1.854 m)     GENERAL: The patient is a well-nourished male, in no acute distress. The vital signs are documented above. CARDIAC: There is a regular rate and rhythm.  VASCULAR: I do not detect carotid bruits. He has palpable femoral pulses. I cannot palpate pedal  pulses however both feet are warm and well-perfused. He has no significant lower extremity swelling. PULMONARY: There is good air exchange bilaterally without wheezing or rales. ABDOMEN: Soft and non-tender with normal pitched bowel sounds. I cannot palpate his aneurysm. MUSCULOSKELETAL: There are no major deformities or cyanosis. NEUROLOGIC: No focal weakness or paresthesias are detected. SKIN: There are no ulcers or rashes noted. PSYCHIATRIC: The patient has a normal affect.  DATA:   LEFT LOWER EXTREMITY ARTERIAL DUPLEX: I have independently interpreted his left lower extremity arterial duplex. He has biphasic Doppler signal signals throughout his graft with an ABI of 100%. There is some tortuosity in the distal graft. There is also some mild diffuse disease throughout the graft without significant stenosis noted however.  CAROTID DUPLEX: I reviewed his carotid duplex scan that was done on 09/19/2016. The right carotid endarterectomy site is widely patent. There is a less than 39% left carotid stenosis.  MEDICAL ISSUES:  PERIPHERAL VASCULAR DISEASE: His left femoropopliteal bypass graft is patent. ABIs under percent. I've encouraged him to stay as active as possible. I'll order an ABI and graft duplex in 1 year  and see him back at that time.  CAROTID DISEASE: Carotid duplex scan shows no evidence of recurrent carotid stenosis on the right. He has less than 39% left carotid stenosis. He is asymptomatic. I have ordered a follow up carotid duplex scan in 1 year and I'll see him back at that time. He is not on aspirin as he is on Eliquis.  SMALL ABDOMINAL AORTIC ANEURYSM: Back in 2012 he was noted to have a small, 3.0 cm, infrarenal abdominal aortic aneurysm. He will be due for a follow up duplex scan in 1 year when he returns for his other studies. Again he remains asymptomatic. He no longer smokes.    Deitra Mayo [facility] [phone]

## 2016-09-27 NOTE — Addendum Note (Signed)
Addended by: Lianne Cure A on: 09/27/2016 09:56 AM   Modules accepted: Orders

## 2016-10-02 ENCOUNTER — Other Ambulatory Visit: Payer: Self-pay | Admitting: Internal Medicine

## 2016-10-02 ENCOUNTER — Encounter: Payer: Self-pay | Admitting: Internal Medicine

## 2016-10-03 ENCOUNTER — Encounter: Payer: Self-pay | Admitting: Cardiology

## 2016-10-04 ENCOUNTER — Ambulatory Visit (INDEPENDENT_AMBULATORY_CARE_PROVIDER_SITE_OTHER): Payer: BLUE CROSS/BLUE SHIELD | Admitting: Internal Medicine

## 2016-10-04 ENCOUNTER — Telehealth: Payer: Self-pay | Admitting: Cardiology

## 2016-10-04 ENCOUNTER — Encounter: Payer: BLUE CROSS/BLUE SHIELD | Admitting: *Deleted

## 2016-10-04 DIAGNOSIS — I509 Heart failure, unspecified: Secondary | ICD-10-CM | POA: Diagnosis not present

## 2016-10-04 LAB — PULMONARY FUNCTION TEST
DL/VA % PRED: 59 %
DL/VA: 2.8 ml/min/mmHg/L
DLCO COR: 14.44 ml/min/mmHg
DLCO cor % pred: 40 %
DLCO unc % pred: 40 %
DLCO unc: 14.56 ml/min/mmHg
FEF 25-75 Post: 1.21 L/sec
FEF 25-75 Pre: 0.93 L/sec
FEF2575-%CHANGE-POST: 29 %
FEF2575-%PRED-PRE: 38 %
FEF2575-%Pred-Post: 50 %
FEV1-%Change-Post: 7 %
FEV1-%PRED-PRE: 58 %
FEV1-%Pred-Post: 62 %
FEV1-Post: 2.1 L
FEV1-Pre: 1.96 L
FEV1FVC-%CHANGE-POST: 3 %
FEV1FVC-%Pred-Pre: 86 %
FEV6-%Change-Post: 5 %
FEV6-%PRED-PRE: 70 %
FEV6-%Pred-Post: 74 %
FEV6-PRE: 3.07 L
FEV6-Post: 3.23 L
FEV6FVC-%Change-Post: 1 %
FEV6FVC-%PRED-PRE: 103 %
FEV6FVC-%Pred-Post: 105 %
FVC-%Change-Post: 3 %
FVC-%PRED-POST: 70 %
FVC-%PRED-PRE: 67 %
FVC-POST: 3.26 L
FVC-PRE: 3.14 L
POST FEV6/FVC RATIO: 99 %
PRE FEV1/FVC RATIO: 62 %
Post FEV1/FVC ratio: 64 %
Pre FEV6/FVC Ratio: 98 %
RV % pred: 92 %
RV: 2.51 L
TLC % pred: 76 %
TLC: 5.77 L

## 2016-10-04 MED ORDER — DOXAZOSIN MESYLATE 4 MG PO TABS: ORAL_TABLET | ORAL | 1 refills | Status: DC

## 2016-10-04 NOTE — Telephone Encounter (Signed)
Patient wife called regarding cardiac MRI for husband Troy [name].  His appointment is 10-16-16 at noon with the patient arriving at 11:30 at the hospital.  Patient spouse understood.

## 2016-10-05 ENCOUNTER — Ambulatory Visit (INDEPENDENT_AMBULATORY_CARE_PROVIDER_SITE_OTHER): Payer: BLUE CROSS/BLUE SHIELD | Admitting: *Deleted

## 2016-10-05 DIAGNOSIS — I480 Paroxysmal atrial fibrillation: Secondary | ICD-10-CM

## 2016-10-05 NOTE — Progress Notes (Signed)
Carelink Summary Report / Loop Recorder 

## 2016-10-08 ENCOUNTER — Telehealth: Payer: Self-pay | Admitting: Cardiology

## 2016-10-08 NOTE — Telephone Encounter (Signed)
New message ° ° ° ° °Returning a call to the nurse °

## 2016-10-08 NOTE — Telephone Encounter (Signed)
Spoke with patient's wife about PFT results.

## 2016-10-16 ENCOUNTER — Ambulatory Visit (HOSPITAL_COMMUNITY)
Admission: RE | Admit: 2016-10-16 | Discharge: 2016-10-16 | Disposition: A | Discharge status: Home / Self Care | Payer: BLUE CROSS/BLUE SHIELD | Source: Ambulatory Visit | Attending: Cardiology | Admitting: Cardiology

## 2016-10-16 ENCOUNTER — Encounter (HOSPITAL_COMMUNITY): Payer: Self-pay

## 2016-10-16 DIAGNOSIS — I509 Heart failure, unspecified: Secondary | ICD-10-CM

## 2016-10-22 ENCOUNTER — Other Ambulatory Visit: Payer: Self-pay | Admitting: Internal Medicine

## 2016-10-22 ENCOUNTER — Ambulatory Visit (HOSPITAL_COMMUNITY)
Admission: RE | Admit: 2016-10-22 | Discharge: 2016-10-22 | Disposition: A | Discharge status: Home / Self Care | Payer: BLUE CROSS/BLUE SHIELD | Source: Ambulatory Visit | Attending: Cardiology | Admitting: Cardiology

## 2016-10-22 DIAGNOSIS — I509 Heart failure, unspecified: Secondary | ICD-10-CM | POA: Insufficient documentation

## 2016-10-22 LAB — CUP PACEART REMOTE DEVICE CHECK
Implantable Pulse Generator Implant Date: 20161111
MDC IDC SESS DTM: 20171206170728

## 2016-10-22 MED ORDER — GADOBENATE DIMEGLUMINE 529 MG/ML IV SOLN
30.0000 mL | Freq: Once | INTRAVENOUS | Status: AC | PRN
  Administered 2016-10-22: 30 mL via INTRAVENOUS

## 2016-10-22 NOTE — Progress Notes (Signed)
Carelink summary report received. Battery status OK. Normal device function. No new symptom episodes, tachy episodes, brady, or pause episodes. 753 AF 40.4% +Eliquis +amio. Monthly summary reports and ROV/PRN

## 2016-11-05 ENCOUNTER — Ambulatory Visit (INDEPENDENT_AMBULATORY_CARE_PROVIDER_SITE_OTHER): Payer: BLUE CROSS/BLUE SHIELD | Admitting: *Deleted

## 2016-11-05 DIAGNOSIS — I480 Paroxysmal atrial fibrillation: Secondary | ICD-10-CM | POA: Diagnosis not present

## 2016-11-06 NOTE — Progress Notes (Signed)
Carelink Summary Report / Loop Recorder 

## 2016-11-07 ENCOUNTER — Other Ambulatory Visit: Payer: Self-pay | Admitting: Cardiology

## 2016-11-13 ENCOUNTER — Other Ambulatory Visit: Payer: Self-pay | Admitting: Cardiology

## 2016-11-13 DIAGNOSIS — I509 Heart failure, unspecified: Secondary | ICD-10-CM

## 2016-11-13 DIAGNOSIS — R0602 Shortness of breath: Secondary | ICD-10-CM

## 2016-11-18 LAB — CUP PACEART REMOTE DEVICE CHECK
Date Time Interrogation Session: 20180105173710
Implantable Pulse Generator Implant Date: 20161111

## 2016-11-18 NOTE — Progress Notes (Signed)
Carelink summary report received. Battery status OK. Normal device function. No new symptom episodes, tachy episodes, brady, or pause episodes. 18 AF 10.6% +EliquisMonthly summary reports and ROV/PRN

## 2016-11-27 ENCOUNTER — Ambulatory Visit (HOSPITAL_COMMUNITY)
Admission: RE | Admit: 2016-11-27 | Discharge: 2016-11-27 | Disposition: A | Discharge status: Home / Self Care | Payer: BLUE CROSS/BLUE SHIELD | Source: Ambulatory Visit | Attending: Cardiology | Admitting: Cardiology

## 2016-11-27 ENCOUNTER — Encounter (HOSPITAL_COMMUNITY): Payer: Self-pay

## 2016-11-27 VITALS — BP 124/60 | HR 81 | Wt 207.0 lb

## 2016-11-27 DIAGNOSIS — I480 Paroxysmal atrial fibrillation: Secondary | ICD-10-CM | POA: Insufficient documentation

## 2016-11-27 DIAGNOSIS — I110 Hypertensive heart disease with heart failure: Secondary | ICD-10-CM | POA: Insufficient documentation

## 2016-11-27 DIAGNOSIS — Z85118 Personal history of other malignant neoplasm of bronchus and lung: Secondary | ICD-10-CM | POA: Diagnosis not present

## 2016-11-27 DIAGNOSIS — Z79899 Other long term (current) drug therapy: Secondary | ICD-10-CM | POA: Diagnosis not present

## 2016-11-27 DIAGNOSIS — I714 Abdominal aortic aneurysm, without rupture: Secondary | ICD-10-CM | POA: Insufficient documentation

## 2016-11-27 DIAGNOSIS — E785 Hyperlipidemia, unspecified: Secondary | ICD-10-CM | POA: Diagnosis not present

## 2016-11-27 DIAGNOSIS — J449 Chronic obstructive pulmonary disease, unspecified: Secondary | ICD-10-CM | POA: Insufficient documentation

## 2016-11-27 DIAGNOSIS — G4733 Obstructive sleep apnea (adult) (pediatric): Secondary | ICD-10-CM | POA: Insufficient documentation

## 2016-11-27 DIAGNOSIS — I5032 Chronic diastolic (congestive) heart failure: Secondary | ICD-10-CM | POA: Insufficient documentation

## 2016-11-27 DIAGNOSIS — M4800 Spinal stenosis, site unspecified: Secondary | ICD-10-CM | POA: Diagnosis not present

## 2016-11-27 DIAGNOSIS — E782 Mixed hyperlipidemia: Secondary | ICD-10-CM | POA: Diagnosis not present

## 2016-11-27 DIAGNOSIS — Z87891 Personal history of nicotine dependence: Secondary | ICD-10-CM | POA: Diagnosis not present

## 2016-11-27 DIAGNOSIS — I739 Peripheral vascular disease, unspecified: Secondary | ICD-10-CM

## 2016-11-27 DIAGNOSIS — H409 Unspecified glaucoma: Secondary | ICD-10-CM | POA: Diagnosis not present

## 2016-11-27 LAB — COMPREHENSIVE METABOLIC PANEL
ALK PHOS: 63 U/L (ref 38–126)
ALT: 26 U/L (ref 17–63)
AST: 26 U/L (ref 15–41)
Albumin: 3.7 g/dL (ref 3.5–5.0)
Anion gap: 10 (ref 5–15)
BUN: 15 mg/dL (ref 6–20)
CALCIUM: 9.7 mg/dL (ref 8.9–10.3)
CO2: 22 mmol/L (ref 22–32)
CREATININE: 1.33 mg/dL — AB (ref 0.61–1.24)
Chloride: 106 mmol/L (ref 101–111)
GFR, EST AFRICAN AMERICAN: 58 mL/min — AB (ref >60)
GFR, EST NON AFRICAN AMERICAN: 50 mL/min — AB (ref >60)
Glucose, Bld: 130 mg/dL — ABNORMAL HIGH (ref 65–99)
Potassium: 3.8 mmol/L (ref 3.5–5.1)
Sodium: 138 mmol/L (ref 135–145)
Total Bilirubin: 0.6 mg/dL (ref 0.3–1.2)
Total Protein: 7.2 g/dL (ref 6.5–8.1)

## 2016-11-27 LAB — LIPID PANEL
CHOLESTEROL: 113 mg/dL (ref 0–200)
HDL: 46 mg/dL (ref >40)
LDL Cholesterol: 39 mg/dL (ref 0–99)
Total CHOL/HDL Ratio: 2.5 RATIO
Triglycerides: 140 mg/dL (ref <150)
VLDL: 28 mg/dL (ref 0–40)

## 2016-11-27 LAB — TSH: TSH: 1.744 u[IU]/mL (ref 0.350–4.500)

## 2016-11-27 NOTE — Progress Notes (Signed)
Patient ID: Troy Gillian [name], male   DOB: 01-13-1941, 76 y.o.   MRN: [id number] PCP: Dr. Quay Burow Cardiology: [name]  76 yo with history of HTN, COPD, active smoking/COPD, carotid stenosis s/p right CEA, paroxysmal atrial fibrillation, and PAD presents for cardiology followup.  He does not have known CAD but is at high risk for CAD based on his comorbidities.  Lexiscan Cardiolite in 3/14 showed no ischemia or infarction and echo in 3/14 showed normal EF.  He had a left fem-pop bypass at the [facility] in the '90s.  He is followed at [facility] for PAD. He has chronic right calf/thigh/buttocks claudication that is unchanged over the last few years and follows regularly at [facility].  He has had right lower lobectomy for lung cancer.  He continues to stay off cigarettes.    At a prior appointment, he was in atrial fibrillation but did not realize it.  I started him on Eliquis and diltiazem CD, and he spontaneously converted to NSR.  In 5/15, he was at [name] and felt "strange" one day: fatigued, weak, short of breath.  He went to the ER and was in atrial fibrillation with HR in 80s-90s.  He spontaneously converted to NSR in the ER.  He felt back to normal after converting to NSR.  I started him on Multaq 400 mg bid.    He had patch angioplasty revision of left fem-pop bypass in 11/16.  Now with minimal claudication.  He follows with [facility].     He had a Cardiolite and echo in 7/17 that were unremarkable.  Repeat Cardiolite in 12/17 showed no ischemia, echo was uninterpretable.  Cardiac MRI was therefore done in 1/18, showing EF 66% with normal-appearing RV, no late gadolinium enhancement.   Patient developed diastolic CHF last fall.  He was started on Lasix with fall in weight.  Weight is down another 2 lbs.  No dyspnea walking 2 blocks.  Still gets fatigued.  Eyesight impaired by glaucoma. No chest pain.  He remains in NSR, no palpitations.   Labs (3/13): LDL 75, HDL 35, K 4.1, creatinine 1.1 Labs (3/14): LDL 91,  HDL 27 Labs (10/14): K 4.3, creatinine 0.98 Labs (4/15): K 4.9, creatinine 1.1, LDL 76, HDL 30 Labs (5/15): K 4.3, creatinine 1.7, BNP 251 Labs (6/15): K 4.4, creatinine 1.3 Labs (10/15): TSH normal Labs (5/16): K 4.2, creatinine 1.12, HCT 39.5, LFTs normal, LDL 84, HDL 43, TSH normal Labs (11/16): creatinine 0.94 Labs (4/17): LDL 39, HDL 39 Labs (6/17): K 4.5, creatinine 1.2 Labs (9/17): K 4, creatinine 1.25, HCT 38.7 Labs (12/17): K 4.5, creatinine 1.49 => 1.62, BNP 43  ECG (personally reviewed): NSR, low voltage P waves  PMH: 1. HTN: Fatigue and cough with ramipril use.  2. COPD: Quit smoking 2014. PFTs (1/18) with moderately severe COPD.  3. AAA: CT 1/13 with 3.0 cm AAA.  Abdominal US (1/14) with 3.4 cm AAA. Abdominal US (4/15) with 3.25 x 3.27 AAA. Abdominal US (3/16) with 3.7 cm AAA.  Followed at [facility].  4. Squamous cell lung cancer diagnosed 2/12.  Had right lower lobectomy in 4/12.  5. Hyperlipidemia 6. PAD: Left fem-pop bypass 1994.  ABIs (2/12) 0.62 on right, 0.95 on left. ABIs (1/14): 0.65 on right, 1.04 on left. ABIs (4/15) 0.66 right, 0.87 left.  ABIs (3/16) 0.6 right, 0.88 left.  Patch angioplasty left fem-pop bypass in 11/16.  - Left fem-pop bypass patent on doppler evaluation in 12/17.  7. Stress myoview 2004 was normal.  Lexiscan Cardiolite (3/14) with EF 66%, no ischemia or infarction. Lexiscan Cardiolite (7/17) with EF 61%, no ischemia/infarction.  - Cardiolite (12/17) with EF 62%, inferior/inferolateral fixed defect, most likely diaphragmatic attenuation, no ischemia.  8. Chronic diastolic CHF: Echo (5/40): technically difficult with EF 55-60%, upper normal RV size.  Echo (5/16) with EF 60-65%.  Echo (7/17) with EF 55-60%, normal RV size and systolic function.  - Echo 12/17 with very poor windows, unable to comment on LV or RV function.  - Cardiac MRI (1/18) with EF 66%, normal RV size and systolic function.  9. Carotid stenosis: TIA 10/14.  Carotid dopplers with > 98%  RICA, 11-91% LICA.  Patient had right CEA in 10/14. Carotids (4/15) patent right CEA, LICA 47-82% stenosis.  Carotids (95/62) < 13% LICA, patent right CEA.  - Carotid dopplers (12/17): right CEA ok, < 08% LICA stenosis.  10. Atrial fibrillation: Paroxysmal. First noted after lobectomy in 4/12 (brief), recurrence in 4/15 then in 5/15.  11. Spinal stenosis 12. OSA: Using CPAP.  38. Glaucoma  SH: Married, lives in [place].  Former [place].  Quit smoking in 2014.  Rare ETOH now.   FH: No premature CAD  ROS: All systems reviewed and negative except as per HPI.   Current Outpatient Prescriptions  Medication Sig Dispense Refill  . amiodarone (PACERONE) 200 MG tablet Take 200 mg by mouth daily.    [name] apixaban (ELIQUIS) 5 MG TABS tablet Take 1 tablet (5 mg total) by mouth 2 (two) times daily. 60 tablet 8  . bimatoprost (LUMIGAN) 0.01 % SOLN Place 1 drop into the left eye at bedtime.    . Biotin 5000 MCG TABS Take 5,000 mcg by mouth daily.    . brimonidine (ALPHAGAN P) 0.1 % SOLN Place 3 drops into the left eye 3 (three) times daily.     [name] diltiazem (CARDIZEM CD) 120 MG 24 hr capsule TAKE 1 CAPSULE DAILY. 30 capsule 0  . dorzolamide-timolol (COSOPT) 22.3-6.8 MG/ML ophthalmic solution Place 1 drop into the left eye 3 (three) times daily.     [name] doxazosin (CARDURA) 4 MG tablet TAKE (1/2) TABLET DAILY. 45 tablet 1  . FLUoxetine (PROZAC) 20 MG capsule TAKE (1) CAPSULE DAILY. 90 capsule 1  . furosemide (LASIX) 40 MG tablet Take 40 mg (1 tab) by mouth every other day, then take 20 (1/2 tab) mg by mouth alternating days 30 tablet 1  . Ginkgo Biloba 40 MG TABS Take 40 mg by mouth daily.     . iron polysaccharides (POLY-IRON 150) 150 MG capsule Take 150 mg by mouth daily.    . Melatonin 5 MG TABS Take 5 mg by mouth at bedtime.    . Multiple Vitamin (MULTIVITAMIN WITH MINERALS) TABS tablet Take 1 tablet by mouth daily. Centrum Silver    . potassium chloride SA (K-DUR,KLOR-CON) 20 MEQ  tablet TAKE 1 TABLET ONCE DAILY. 90 tablet 3  . ranitidine (ZANTAC) 150 MG tablet Take 150 mg by mouth daily as needed for heartburn.     . rosuvastatin (CRESTOR) 40 MG tablet Take 1 tablet (40 mg total) by mouth daily. 90 tablet 3  . bisacodyl (DULCOLAX) 5 MG EC tablet Take 5 mg by mouth daily as needed for moderate constipation.     No current facility-administered medications for this encounter.     BP 124/60   Pulse 81   Wt 207 lb (93.9 kg)   SpO2 98%   BMI 27.31 kg/m  General: NAD Neck:  No JVD, no thyromegaly or thyroid nodule.  Lungs: Mildly decreased breath sounds right base.  CV: Nondisplaced PMI.  Heart regular S1/S2, no S3/S4, 1/6 SEM RUSB.  No edema.  Bilateral carotid bruits. Difficult to palpate PT pulses.   Abdomen: Soft, nontender, no hepatosplenomegaly, no distention.   Neurologic: Alert and oriented x 3.  Psych: Normal affect. Extremities: No clubbing or cyanosis.   Assessment/Plan: 1. Chronic diastolic CHF: Volume status much improved on Lasix.  Cardiac MRI done in 1/18 due to uninterpretable echo.  This showed LV EF 66% and normal RV size and systolic function.  - Continue Lasix 40 mg daily alternating with 20 mg daily. BMET today.  2. Hyperlipidemia: Continue Crestor.  Check lipids today, goal LDL < 70 with vascular disease.  3. Carotid stenosis: s/p R CEA.  Dopplers followed at [facility].  4. PAD: s/p patch angioplasty to left fem-pop bypass in 11/16.  Followed at [facility]. He is off cilostazol with CHF but no increase in mild claudication.     5. AAA: Stable on recent US, followed at [facility].    6. COPD: No longer smoking.   7. Atrial fibrillation: Patient remains in NSR on amiodarone.  CHADSVASC 3 (age, vascular disease, HTN).  He is also on diltiazem CD 120 mg daily.   - Limit ETOH.  - Use CPAP.  - Continue Eliquis. - He is maintaining NSR on amiodarone (had to stop Multaq due to CHF). Check LFTs and TSH today.  He will need regular eye exam and will inform his  ophthalmologist that he is taking amiodarone.  Will follow PFTs yearly given COPD.  At next appointment, will plan to decrease amiodarone to 100 mg daily if he remains in NSR.  8. CKD: BMET today.    Followup in 4 months.    [name] 11/27/2016

## 2016-11-27 NOTE — Patient Instructions (Signed)
Routine lab work today. Will notify you of abnormal results  Follow up with Dr.McLean in 4 months

## 2016-12-01 LAB — CUP PACEART REMOTE DEVICE CHECK
Date Time Interrogation Session: 20180204173807
MDC IDC PG IMPLANT DT: 20161111

## 2016-12-01 NOTE — Progress Notes (Signed)
Carelink summary report received. Battery status OK. Normal device function. No new symptom episodes, tachy episodes, brady, or pause episodes. No new AF episodes. Monthly summary reports and ROV/PRN 

## 2016-12-04 ENCOUNTER — Ambulatory Visit (INDEPENDENT_AMBULATORY_CARE_PROVIDER_SITE_OTHER): Payer: BLUE CROSS/BLUE SHIELD | Admitting: *Deleted

## 2016-12-04 DIAGNOSIS — I480 Paroxysmal atrial fibrillation: Secondary | ICD-10-CM

## 2016-12-04 NOTE — Progress Notes (Signed)
Carelink Summary Report / Loop Recorder 

## 2016-12-10 ENCOUNTER — Other Ambulatory Visit: Payer: Self-pay | Admitting: Cardiology

## 2016-12-18 ENCOUNTER — Other Ambulatory Visit: Payer: Self-pay | Admitting: Cardiology

## 2016-12-18 DIAGNOSIS — R0602 Shortness of breath: Secondary | ICD-10-CM

## 2016-12-18 DIAGNOSIS — I509 Heart failure, unspecified: Secondary | ICD-10-CM

## 2016-12-19 ENCOUNTER — Telehealth (HOSPITAL_COMMUNITY): Payer: Self-pay | Admitting: *Deleted

## 2016-12-19 NOTE — Telephone Encounter (Signed)
Patient's wife called and left message on triage asking for [name] to refill patient's Prozac.    I called Pt's wife back and left message advising her she would need to call Dr. Quay Burow (the prescribing MD) for refills for that medication.

## 2017-01-03 ENCOUNTER — Ambulatory Visit (INDEPENDENT_AMBULATORY_CARE_PROVIDER_SITE_OTHER): Payer: BLUE CROSS/BLUE SHIELD | Admitting: *Deleted

## 2017-01-03 DIAGNOSIS — I480 Paroxysmal atrial fibrillation: Secondary | ICD-10-CM

## 2017-01-04 NOTE — Progress Notes (Signed)
Carelink Summary Report / Loop Recorder 

## 2017-01-05 LAB — CUP PACEART REMOTE DEVICE CHECK
Date Time Interrogation Session: 20180306183716
MDC IDC PG IMPLANT DT: 20161111

## 2017-01-12 LAB — CUP PACEART REMOTE DEVICE CHECK
Date Time Interrogation Session: 20180405183927
Implantable Pulse Generator Implant Date: 20161111

## 2017-01-12 NOTE — Progress Notes (Signed)
Carelink summary report received. Battery status OK. Normal device function. On presenting it appears there is a P wave consistently falling into each T wave. No new symptom episodes, tachy episodes, brady, or pause episodes. No new AF episodes. Monthly summary reports and ROV/PRN

## 2017-02-04 ENCOUNTER — Ambulatory Visit (INDEPENDENT_AMBULATORY_CARE_PROVIDER_SITE_OTHER): Payer: Commercial Managed Care - PPO | Admitting: *Deleted

## 2017-02-04 DIAGNOSIS — I480 Paroxysmal atrial fibrillation: Secondary | ICD-10-CM | POA: Diagnosis not present

## 2017-02-05 NOTE — Progress Notes (Signed)
Carelink Summary Report / Loop Recorder 

## 2017-02-18 LAB — CUP PACEART REMOTE DEVICE CHECK
Date Time Interrogation Session: 20180505193537
Implantable Pulse Generator Implant Date: 20161111

## 2017-03-04 ENCOUNTER — Other Ambulatory Visit: Payer: Self-pay | Admitting: Cardiology

## 2017-03-04 ENCOUNTER — Ambulatory Visit (INDEPENDENT_AMBULATORY_CARE_PROVIDER_SITE_OTHER): Payer: Commercial Managed Care - PPO | Admitting: *Deleted

## 2017-03-04 DIAGNOSIS — I480 Paroxysmal atrial fibrillation: Secondary | ICD-10-CM

## 2017-03-05 NOTE — Progress Notes (Signed)
Carelink Summary Report / Loop Recorder 

## 2017-03-06 LAB — CUP PACEART REMOTE DEVICE CHECK
Implantable Pulse Generator Implant Date: 20161111
MDC IDC SESS DTM: 20180604201309

## 2017-03-27 ENCOUNTER — Telehealth: Payer: Self-pay | Admitting: Cardiology

## 2017-03-27 NOTE — Telephone Encounter (Signed)
Spoke w/ pt wife and requested that pt send a manual transmission b/c his home monitor has not updated in at least 14 days.

## 2017-03-28 ENCOUNTER — Encounter (HOSPITAL_COMMUNITY): Payer: Self-pay

## 2017-03-28 ENCOUNTER — Ambulatory Visit (HOSPITAL_COMMUNITY)
Admission: RE | Admit: 2017-03-28 | Discharge: 2017-03-28 | Disposition: A | Discharge status: Home / Self Care | Payer: Commercial Managed Care - PPO | Source: Ambulatory Visit | Attending: Cardiology | Admitting: Cardiology

## 2017-03-28 VITALS — BP 150/74 | HR 67 | Wt 207.2 lb

## 2017-03-28 DIAGNOSIS — Z7901 Long term (current) use of anticoagulants: Secondary | ICD-10-CM | POA: Insufficient documentation

## 2017-03-28 DIAGNOSIS — G4733 Obstructive sleep apnea (adult) (pediatric): Secondary | ICD-10-CM | POA: Insufficient documentation

## 2017-03-28 DIAGNOSIS — E785 Hyperlipidemia, unspecified: Secondary | ICD-10-CM | POA: Insufficient documentation

## 2017-03-28 DIAGNOSIS — I130 Hypertensive heart and chronic kidney disease with heart failure and stage 1 through stage 4 chronic kidney disease, or unspecified chronic kidney disease: Secondary | ICD-10-CM | POA: Diagnosis not present

## 2017-03-28 DIAGNOSIS — I714 Abdominal aortic aneurysm, without rupture: Secondary | ICD-10-CM | POA: Insufficient documentation

## 2017-03-28 DIAGNOSIS — Z87891 Personal history of nicotine dependence: Secondary | ICD-10-CM | POA: Insufficient documentation

## 2017-03-28 DIAGNOSIS — Z79899 Other long term (current) drug therapy: Secondary | ICD-10-CM | POA: Diagnosis not present

## 2017-03-28 DIAGNOSIS — I5032 Chronic diastolic (congestive) heart failure: Secondary | ICD-10-CM | POA: Insufficient documentation

## 2017-03-28 DIAGNOSIS — I739 Peripheral vascular disease, unspecified: Secondary | ICD-10-CM | POA: Diagnosis not present

## 2017-03-28 DIAGNOSIS — J449 Chronic obstructive pulmonary disease, unspecified: Secondary | ICD-10-CM | POA: Diagnosis not present

## 2017-03-28 DIAGNOSIS — N189 Chronic kidney disease, unspecified: Secondary | ICD-10-CM | POA: Diagnosis not present

## 2017-03-28 DIAGNOSIS — I480 Paroxysmal atrial fibrillation: Secondary | ICD-10-CM | POA: Diagnosis not present

## 2017-03-28 DIAGNOSIS — I6529 Occlusion and stenosis of unspecified carotid artery: Secondary | ICD-10-CM | POA: Diagnosis not present

## 2017-03-28 LAB — COMPREHENSIVE METABOLIC PANEL
ALBUMIN: 3.6 g/dL (ref 3.5–5.0)
ALK PHOS: 62 U/L (ref 38–126)
ALT: 21 U/L (ref 17–63)
ANION GAP: 6 (ref 5–15)
AST: 24 U/L (ref 15–41)
BILIRUBIN TOTAL: 0.5 mg/dL (ref 0.3–1.2)
BUN: 12 mg/dL (ref 6–20)
CALCIUM: 9.3 mg/dL (ref 8.9–10.3)
CO2: 27 mmol/L (ref 22–32)
Chloride: 107 mmol/L (ref 101–111)
Creatinine, Ser: 1.49 mg/dL — ABNORMAL HIGH (ref 0.61–1.24)
GFR calc Af Amer: 51 mL/min — ABNORMAL LOW (ref >60)
GFR calc non Af Amer: 44 mL/min — ABNORMAL LOW (ref >60)
GLUCOSE: 122 mg/dL — AB (ref 65–99)
Potassium: 4.3 mmol/L (ref 3.5–5.1)
SODIUM: 140 mmol/L (ref 135–145)
TOTAL PROTEIN: 7.1 g/dL (ref 6.5–8.1)

## 2017-03-28 LAB — CBC
HCT: 39.2 % (ref 39.0–52.0)
HEMOGLOBIN: 12.3 g/dL — AB (ref 13.0–17.0)
MCH: 28.5 pg (ref 26.0–34.0)
MCHC: 31.4 g/dL (ref 30.0–36.0)
MCV: 90.7 fL (ref 78.0–100.0)
PLATELETS: 244 10*3/uL (ref 150–400)
RBC: 4.32 MIL/uL (ref 4.22–5.81)
RDW: 14.6 % (ref 11.5–15.5)
WBC: 4.9 10*3/uL (ref 4.0–10.5)

## 2017-03-28 LAB — TSH: TSH: 1.224 u[IU]/mL (ref 0.350–4.500)

## 2017-03-28 MED ORDER — AMIODARONE HCL 100 MG PO TABS
100.0000 mg | ORAL_TABLET | Freq: Every day | ORAL | 3 refills | Status: DC
Start: 2017-03-28 — End: 2017-10-25

## 2017-03-28 NOTE — Patient Instructions (Signed)
Labs today (will call for abnormal results, otherwise no news is good news)  DECREASE Amiodarone to 100 mg (1 Tablet) Once Daily  Follow up in 4 Months, we will contact you to schedule appointment.

## 2017-03-29 NOTE — Progress Notes (Signed)
Patient ID: Troy Soler [name], male   DOB: 03-07-1941, 76 y.o.   MRN: [id number] PCP: Dr. Quay Burow Cardiology: [name]  76 yo with history of HTN, COPD, active smoking/COPD, carotid stenosis s/p right CEA, paroxysmal atrial fibrillation, and PAD presents for cardiology followup.  He does not have known CAD but is at high risk for CAD based on his comorbidities.  Lexiscan Cardiolite in 3/14 showed no ischemia or infarction and echo in 3/14 showed normal EF.  He had a left fem-pop bypass at the [facility] in the '90s.  He is followed at [facility] for PAD. He has chronic right calf/thigh/buttocks claudication that is unchanged over the last few years and follows regularly at [facility].  He has had right lower lobectomy for lung cancer.  He continues to stay off cigarettes.    At a prior appointment, he was in atrial fibrillation but did not realize it.  I started him on Eliquis and diltiazem CD, and he spontaneously converted to NSR.  In 5/15, he was at [facility] and felt "strange" one day: fatigued, weak, short of breath.  He went to the ER and was in atrial fibrillation with HR in 80s-90s.  He spontaneously converted to NSR in the ER.  He felt back to normal after converting to NSR.  I started him on Multaq 400 mg bid.    He had patch angioplasty revision of left fem-pop bypass in 11/16.  Now with minimal claudication.  He follows with [facility].     He had a Cardiolite and echo in 7/17 that were unremarkable.  Repeat Cardiolite in 12/17 showed no ischemia, echo was uninterpretable.  Cardiac MRI was therefore done in 1/18, showing EF 66% with normal-appearing RV, no late gadolinium enhancement.   Overall, Mr Troy [name] seems to be doing somewhat better . He has stable claudication, no change from prior appointment.  He takes 15-20 minute walks and works out with a trainer twice a week.  No exertional dyspnea.  No chest pain.  He remains in NSR, no palpitations.   Labs (3/13): LDL 75, HDL 35, K 4.1, creatinine 1.1 Labs (3/14):  LDL 91, HDL 27 Labs (10/14): K 4.3, creatinine 0.98 Labs (4/15): K 4.9, creatinine 1.1, LDL 76, HDL 30 Labs (5/15): K 4.3, creatinine 1.7, BNP 251 Labs (6/15): K 4.4, creatinine 1.3 Labs (10/15): TSH normal Labs (5/16): K 4.2, creatinine 1.12, HCT 39.5, LFTs normal, LDL 84, HDL 43, TSH normal Labs (11/16): creatinine 0.94 Labs (4/17): LDL 39, HDL 39 Labs (6/17): K 4.5, creatinine 1.2 Labs (9/17): K 4, creatinine 1.25, HCT 38.7 Labs (12/17): K 4.5, creatinine 1.49 => 1.62, BNP 43 Labs (2/18): K 3.8, creatinine 1.33, LFTs normal, TSH normal  ECG (personally reviewed): NSR, normal  PMH: 1. HTN: Fatigue and cough with ramipril use.  2. COPD: Quit smoking 2014. PFTs (1/18) with moderately severe COPD.  3. AAA: CT 1/13 with 3.0 cm AAA.  Abdominal US (1/14) with 3.4 cm AAA. Abdominal US (4/15) with 3.25 x 3.27 AAA. Abdominal US (3/16) with 3.7 cm AAA.  Followed at [facility].  4. Squamous cell lung cancer diagnosed 2/12.  Had right lower lobectomy in 4/12.  5. Hyperlipidemia 6. PAD: Left fem-pop bypass 1994.  ABIs (2/12) 0.62 on right, 0.95 on left. ABIs (1/14): 0.65 on right, 1.04 on left. ABIs (4/15) 0.66 right, 0.87 left.  ABIs (3/16) 0.6 right, 0.88 left.  Patch angioplasty left fem-pop bypass in 11/16.  - Left fem-pop bypass patent on doppler evaluation in  12/17.  7. Stress myoview 2004 was normal. Lexiscan Cardiolite (3/14) with EF 66%, no ischemia or infarction. Lexiscan Cardiolite (7/17) with EF 61%, no ischemia/infarction.  - Cardiolite (12/17) with EF 62%, inferior/inferolateral fixed defect, most likely diaphragmatic attenuation, no ischemia.  8. Chronic diastolic CHF: Echo (6/26): technically difficult with EF 55-60%, upper normal RV size.  Echo (5/16) with EF 60-65%.  Echo (7/17) with EF 55-60%, normal RV size and systolic function.  - Echo 12/17 with very poor windows, unable to comment on LV or RV function.  - Cardiac MRI (1/18) with EF 66%, normal RV size and systolic function.  9.  Carotid stenosis: TIA 10/14.  Carotid dopplers with > 94% RICA, 85-46% LICA.  Patient had right CEA in 10/14. Carotids (4/15) patent right CEA, LICA 27-03% stenosis.  Carotids (50/09) < 38% LICA, patent right CEA.  - Carotid dopplers (12/17): right CEA ok, < 18% LICA stenosis.  10. Atrial fibrillation: Paroxysmal. First noted after lobectomy in 4/12 (brief), recurrence in 4/15 then in 5/15.  11. Spinal stenosis 12. OSA: Using CPAP.  71. Glaucoma  SH: Married, lives in [place].  Former [place].  Quit smoking in 2014.  Rare ETOH now.   FH: No premature CAD  ROS: All systems reviewed and negative except as per HPI.   Current Outpatient Prescriptions  Medication Sig Dispense Refill  . amiodarone (PACERONE) 100 MG tablet Take 1 tablet (100 mg total) by mouth daily. 30 tablet 3  . apixaban (ELIQUIS) 5 MG TABS tablet Take 1 tablet (5 mg total) by mouth 2 (two) times daily. 60 tablet 8  . bimatoprost (LUMIGAN) 0.01 % SOLN Place 1 drop into the left eye at bedtime.    . Biotin 5000 MCG TABS Take 5,000 mcg by mouth daily.    . bisacodyl (DULCOLAX) 5 MG EC tablet Take 5 mg by mouth daily as needed for moderate constipation.    . brimonidine (ALPHAGAN P) 0.1 % SOLN Place 3 drops into the left eye 3 (three) times daily.     [name] diltiazem (CARDIZEM CD) 120 MG 24 hr capsule TAKE 1 CAPSULE DAILY. 90 capsule 3  . dorzolamide-timolol (COSOPT) 22.3-6.8 MG/ML ophthalmic solution Place 1 drop into the left eye 3 (three) times daily.     [name] doxazosin (CARDURA) 4 MG tablet TAKE (1/2) TABLET DAILY. 45 tablet 1  . FLUoxetine (PROZAC) 20 MG capsule TAKE (1) CAPSULE DAILY. 90 capsule 1  . furosemide (LASIX) 40 MG tablet Take 0.5-1 tablets (20-40 mg total) by mouth as directed. Take 40 mg daily alternating with 20 mg daily 45 tablet 3  . Ginkgo Biloba 40 MG TABS Take 40 mg by mouth daily.     . iron polysaccharides (POLY-IRON 150) 150 MG capsule Take 150 mg by mouth daily.    . Melatonin 5 MG TABS  Take 5 mg by mouth at bedtime.    . potassium chloride SA (K-DUR,KLOR-CON) 20 MEQ tablet TAKE 1 TABLET ONCE DAILY. 90 tablet 3  . ranitidine (ZANTAC) 150 MG tablet Take 150 mg by mouth daily as needed for heartburn.     . rosuvastatin (CRESTOR) 40 MG tablet Take 1 tablet (40 mg total) by mouth daily. 90 tablet 3  . Multiple Vitamin (MULTIVITAMIN WITH MINERALS) TABS tablet Take 1 tablet by mouth daily. Centrum Silver     No current facility-administered medications for this encounter.     BP (!) 150/74   Pulse 67   Wt 207 lb 4 oz (94  kg)   SpO2 98%   BMI 27.34 kg/m  General: NAD Neck: JVP 7 cm, no thyromegaly or thyroid nodule.  Lungs: Clear to auscultation bilaterally  CV: Nondisplaced PMI.  Heart regular S1/S2, no S3/S4, 1/6 SEM RUSB.  No edema.  Bilateral carotid bruits. Difficult to palpate PT pulses.   Abdomen: Soft, nontender, no hepatosplenomegaly, no distention.   Neurologic: Alert and oriented x 3.  Psych: Normal affect. Extremities: No clubbing or cyanosis.   Assessment/Plan: 1. Chronic diastolic CHF: Volume status much improved on Lasix.  Cardiac MRI done in 1/18 due to uninterpretable echo.  This showed LV EF 66% and normal RV size and systolic function.  - Continue Lasix 40 mg daily alternating with 20 mg daily. BMET today.  2. Hyperlipidemia: Continue Crestor.  Good lipids in 2/18.   3. Carotid stenosis: s/p R CEA.  Dopplers followed at [facility].  4. PAD: s/p patch angioplasty to left fem-pop bypass in 11/16.  Followed at [facility]. He is off cilostazol with CHF but no increase in mild stable claudication.     5. AAA: Stable on recent US, followed at [facility].    6. COPD: No longer smoking.   7. Atrial fibrillation: Patient remains in NSR on amiodarone.  CHADSVASC 3 (age, vascular disease, HTN).  He is also on diltiazem CD 120 mg daily.   - Limit ETOH.  - Use CPAP.  - Continue Eliquis. - He is maintaining NSR on amiodarone (had to stop Multaq due to CHF). Check LFTs and TSH today.   He will need regular eye exam and will inform his ophthalmologist that he is taking amiodarone.  Will follow PFTs yearly given COPD.  Today, can decrease amiodarone to 100 mg daily. 8. CKD: BMET today.    Followup in 4 months.    [name] 03/29/2017

## 2017-04-04 ENCOUNTER — Ambulatory Visit (INDEPENDENT_AMBULATORY_CARE_PROVIDER_SITE_OTHER): Payer: Commercial Managed Care - PPO | Admitting: *Deleted

## 2017-04-04 ENCOUNTER — Other Ambulatory Visit: Payer: Self-pay | Admitting: Internal Medicine

## 2017-04-04 DIAGNOSIS — I480 Paroxysmal atrial fibrillation: Secondary | ICD-10-CM | POA: Diagnosis not present

## 2017-04-05 NOTE — Telephone Encounter (Signed)
Pls call pt he is overdue for appt last saw MD 03/2016 need annual appt schedule, and after appt has been made we can send enough until his papt

## 2017-04-05 NOTE — Progress Notes (Signed)
Carelink Summary Report / Loop Recorder 

## 2017-04-05 NOTE — Telephone Encounter (Signed)
Called and spoke to wife, wife will get Pt to call back.

## 2017-04-05 NOTE — Telephone Encounter (Signed)
Pharmacy called about doxazosin (CARDURA) 4 MG tablet

## 2017-04-08 ENCOUNTER — Other Ambulatory Visit: Payer: Self-pay | Admitting: Internal Medicine

## 2017-04-08 LAB — CUP PACEART REMOTE DEVICE CHECK
Implantable Pulse Generator Implant Date: 20161111
MDC IDC SESS DTM: 20180704200924

## 2017-04-08 MED ORDER — DOXAZOSIN MESYLATE 4 MG PO TABS: ORAL_TABLET | ORAL | 0 refills | Status: DC

## 2017-04-08 NOTE — Telephone Encounter (Signed)
Pt wife called back she made appt for husband for 05/08/17. Inform will send temporary rx to [facility]. Sent electronically,.../LMB

## 2017-04-16 ENCOUNTER — Other Ambulatory Visit: Payer: Self-pay | Admitting: Internal Medicine

## 2017-04-19 DIAGNOSIS — Z122 Encounter for screening for malignant neoplasm of respiratory organs: Secondary | ICD-10-CM | POA: Insufficient documentation

## 2017-05-02 NOTE — Progress Notes (Signed)
Subjective:    Patient ID: Troy Kenner [name], male    DOB: 11/10/1940, 76 y.o.   MRN: [id number]  HPI He is here for a physical exam.    Anxiety: He is taking his medication daily as prescribed. He denies any side effects from the medication. He feels his anxiety is well controlled and he is happy with his current dose of medication.   Tremor:  He has occasional tremor in his hands.  The left hand is worse.  When he eats sometimes he gets some tremor in his right hand.  His balance feels worse. He has poor vision from glaucoma.   He denies family tremor.   Prediabetes:  He is not compliant with a low sugar/carbohydrate diet.  He is exercising minimally.   Medications and allergies reviewed with patient and updated if appropriate.  Patient Active Problem List   Diagnosis Date Noted  . CAD (coronary artery disease) 05/03/2017  . MGUS (monoclonal gammopathy of unknown significance) 03/07/2016  . Benign essential HTN 01/19/2016  . Atherosclerotic peripheral vascular disease ([place]) 08/29/2015  . OSA (obstructive sleep apnea) 08/29/2015  . PAF (paroxysmal atrial fibrillation) ([place])   . Glaucoma 06/22/2015  . Dyspnea 02/04/2015  . Memory loss, short term 08/03/2014  . Pain of right lower leg- and Left Thigh and leg 07/28/2014  . Anemia, unspecified 12/28/2013  . Carotid stenosis 07/24/2013  . Occlusion and stenosis of carotid artery without mention of cerebral infarction 07/09/2013  . COPD with emphysema ([place]) 07/09/2013  . Chronic renal disease, stage 3, moderately decreased glomerular filtration rate between 30-59 mL/min/1.73 square meter 07/06/2013  . TIA (transient ischemic attack) 07/06/2013  . Chest pain 12/01/2012  . Fatigue 12/01/2012  . Right carotid bruit 12/01/2012  . Compulsive tobacco user syndrome 10/16/2012  . Basal cell carcinoma of skin 10/10/2012  . History of lung cancer 03/11/2012  . Lung nodule, multiple 03/11/2012  . PAD (peripheral artery disease) ([place])  12/16/2011  . AAA (abdominal aortic aneurysm) ([place]) 09/27/2011  . Squamous cell lung cancer ([place]) 11/08/2010  . GAIT IMBALANCE 03/02/2009  . FASTING HYPERGLYCEMIA 03/02/2009  . COLONIC POLYPS, HX OF 03/02/2009  . OBSTRUCTIVE CHRONIC BRONCHITIS WITHOUT EXACERBAT 01/15/2008  . HYPERPLASIA PROSTATE UNS W/O UR OBST & OTH LUTS 01/15/2008  . HYPERLIPIDEMIA 09/29/2007  . Peripheral vascular disease ([place]) 09/29/2007  . LEUKOPLAKIA, VOCAL CORDS 09/29/2007  . FEMORAL BRUIT 09/29/2007    Current Outpatient Prescriptions on File Prior to Visit  Medication Sig Dispense Refill  . amiodarone (PACERONE) 100 MG tablet Take 1 tablet (100 mg total) by mouth daily. 30 tablet 3  . apixaban (ELIQUIS) 5 MG TABS tablet Take 1 tablet (5 mg total) by mouth 2 (two) times daily. 60 tablet 8  . bimatoprost (LUMIGAN) 0.01 % SOLN Place 1 drop into the left eye at bedtime.    . Biotin 5000 MCG TABS Take 5,000 mcg by mouth daily.    . bisacodyl (DULCOLAX) 5 MG EC tablet Take 5 mg by mouth daily as needed for moderate constipation.    . brimonidine (ALPHAGAN P) 0.1 % SOLN Place 3 drops into the left eye 3 (three) times daily.     [name] CARDURA 4 MG tablet TAKE (1/2) TABLET DAILY. 45 tablet 0  . diltiazem (CARDIZEM CD) 120 MG 24 hr capsule TAKE 1 CAPSULE DAILY. 90 capsule 3  . dorzolamide-timolol (COSOPT) 22.3-6.8 MG/ML ophthalmic solution Place 1 drop into the left eye 3 (three) times daily.     [name] FLUoxetine (  PROZAC) 20 MG capsule Take 1 capsule (20 mg total) by mouth daily. Keep 05/03/17 appt for future refills 30 capsule 0  . furosemide (LASIX) 40 MG tablet Take 0.5-1 tablets (20-40 mg total) by mouth as directed. Take 40 mg daily alternating with 20 mg daily 45 tablet 3  . Ginkgo Biloba 40 MG TABS Take 40 mg by mouth daily.     . iron polysaccharides (POLY-IRON 150) 150 MG capsule Take 150 mg by mouth daily.    . Melatonin 5 MG TABS Take 5 mg by mouth at bedtime.    . Multiple Vitamin (MULTIVITAMIN WITH MINERALS) TABS  tablet Take 1 tablet by mouth daily. Centrum Silver    . potassium chloride SA (K-DUR,KLOR-CON) 20 MEQ tablet TAKE 1 TABLET ONCE DAILY. 90 tablet 3  . ranitidine (ZANTAC) 150 MG tablet Take 150 mg by mouth daily as needed for heartburn.     . rosuvastatin (CRESTOR) 40 MG tablet Take 1 tablet (40 mg total) by mouth daily. 90 tablet 3   No current facility-administered medications on file prior to visit.     Past Medical History:  Diagnosis Date  . AAA (abdominal aortic aneurysm) (HCC) LAST ABD. [name]  JAN 2014  3.4CM   MONITORED BY DR [name]  . Arthritis   . Atrial fibrillation ([place])   . Benign essential HTN 01/19/2016  . Bilateral carotid artery stenosis DUPLEX 12-29-2012  BY DR [facility]   BILATERAL ICA STENOSIS 60-79%  . BPH (benign prostatic hypertrophy)   . Cancer ([place]) 01-25-12   LUNG  . Complication of anesthesia    had nausea after carotid artery surgery, states the medicine given to help the nausea, made it worse  . COPD (chronic obstructive pulmonary disease) ([place])   . Exertional dyspnea   . Frequency of urination   . GERD (gastroesophageal reflux disease)   . Glaucoma BOTH EYES   Dr Gershon Crane  . History of basal cell carcinoma excision   . History of lung cancer APRIL 2012  SQUAMOUS CELL---- S/P RIGHT LOWER LOBECTOMY AT [facility] --  NO CHEMORADIATION---  NO RECURRENCE    ONCOLOGIST- DR Tressie Stalker  LOV [facility] 10-27-2012  . Hx of adenomatous colonic polyps 2005    X 2; 1 hyperplastic polyp; Dr Olevia Perches  . Hyperlipidemia   . Hypertension   . Impaired fasting glucose 2007   108; A1c5.4%  . Lesion of bladder   . Microhematuria   . Nocturia   . OSA (obstructive sleep apnea) 08/29/2015   Mild with AHI overall 6/hr but severe during REM sleep with AHI 30/hr with oxygen saturations < 88% for 5.5 minutes of total sleep time.    [name] PAD (peripheral artery disease) ([place]) ABI'S  JAN 2014  0.65 ON RIGHT ;  1.04 ON LEFT  . Peripheral vascular disease ([place]) S/P ANGIOPLASTY AND STENTING    FOLLOWED  BY DR [name]  . Spinal stenosis Sept. 2015  . Stroke [facility]) Jul 07, 2013   mini  TIA  . Urgency of urination     Past Surgical History:  Procedure Laterality Date  . ANGIO PLASTY     X 4 in legs  . AORTOGRAM  07-27-2002   MILD DIFFUSE ILIAC ARTERY OCCLUSIVE DISEASE /  LEFT RENAL ARTERY 20%/ PATENT LEFT FEM-POP GRAFT/ MILD SFA AND POPLITEAL ARTERY OCCLUSIVE DISEASE W/ SEVERE KIDNEY OCCLUSIVE DISEASE  . BASAL CELL CARCINOMA EXCISION     MULTIPLE TIMES--  RIGHT FOREARM, CHEEKS, AND BACK  . CARDIOVASCULAR STRESS TEST  12-08-2012  DR MCLEAN   NORMAL LEXISCAN WITH NO EXERCISE NUCLEAR STUDY/ EF 66%/   NO ISCHEMIA/ NO SIGNIFICANT CHANGE FROM PRIOR STUDY  . CAROTID ANGIOGRAM N/A 07/10/2013   Procedure: CAROTID ANGIOGRAM;  Surgeon: Elam Dutch, MD;  Location: [facility] C[facility] LAB;  Service: Cardiovascular;  Laterality: N/A;  . CAROTID ENDARTERECTOMY Right 07-14-13   cea  . CATARACT EXTRACTION W/ INTRAOCULAR LENS  IMPLANT, BILATERAL    . colon polyectomy    . CYSTOSCOPY W/ RETROGRADES Bilateral 01/21/2013   Procedure: CYSTOSCOPY WITH RETROGRADE PYELOGRAM;  Surgeon: Molli Hazard, MD;  Location: [facility];  Service: Urology;  Laterality: Bilateral;   CYSTO, BLADDER BIOPSY, BILATERAL RETROGRADE PYELOGRAM  RAD TECH FROM RADIOLOGY PER JOY  . CYSTOSCOPY WITH BIOPSY N/A 01/21/2013   Procedure: CYSTOSCOPY WITH BIOPSY;  Surgeon: Molli Hazard, MD;  Location: [facility];  Service: Urology;  Laterality: N/A;  . ENDARTERECTOMY Right 07/14/2013   Procedure: ENDARTERECTOMY CAROTID;  Surgeon: Angelia Mould, MD;  Location: [place];  Service: Vascular;  Laterality: Right;  . EP IMPLANTABLE DEVICE N/A 08/12/2015   Procedure: Loop Recorder Insertion;  Surgeon: Thompson Grayer, MD;  Location: [place] CV LAB;  Service: Cardiovascular;  Laterality: N/A;  . FEMORAL-POPLITEAL BYPASS GRAFT Left [facility]   AND 2001  IN [place]  . FEMORAL-POPLITEAL  BYPASS GRAFT Left 08/30/2015   Procedure: REVISION OF BYPASS GRAFT Left  FEMORAL-POPLITEAL ARTERY;  Surgeon: Angelia Mould, MD;  Location: [place];  Service: Vascular;  Laterality: Left;  . LARYNGOSCOPY  06-27-2004   BX VOCAL CORD  (LEUKOPLAKIA)  PER PT NO ISSUES SINCE  . LOWER EXTREMITY ANGIOGRAM Bilateral 08/29/2015   Procedure: Lower Extremity Angiogram;  Surgeon: Angelia Mould, MD;  Location: [place] CV LAB;  Service: Cardiovascular;  Laterality: Bilateral;  . LUNG LOBECTOMY  01/24/2011    RIGHT UPPER LOBE  (SQUAMOUS CELL CARCINOMA) Dr Dorthea Cove , [facility]. No chemotherapyor radiation  . PATCH ANGIOPLASTY Right 07/14/2013   Procedure: PATCH ANGIOPLASTY;  Surgeon: Angelia Mould, MD;  Location: [place];  Service: Vascular;  Laterality: Right;  . PATCH ANGIOPLASTY Left 08/30/2015   Procedure: VEIN PATCH ANGIOPLASTY OF PROXIMAL Left BYPASS GRAFT;  Surgeon: Angelia Mould, MD;  Location: [place];  Service: Vascular;  Laterality: Left;  . PERIPHERAL VASCULAR CATHETERIZATION N/A 08/29/2015   Procedure: Abdominal Aortogram;  Surgeon: Angelia Mould, MD;  Location: [place] CV LAB;  Service: Cardiovascular;  Laterality: N/A;  . trabecular surgery     OS  . TRANSTHORACIC ECHOCARDIOGRAM  12-29-2012  DR [facility]   MILD LVH/  LVSF NORMAL/ EF 54-65%/  GRADE I DIASTOLIC DYSFUNCTION    Social History   Social History  . Marital status: Married    Spouse name: [name]   . Number of children: 3  . Years of education: 12+   Social History Main Topics  . Smoking status: Former Smoker    Packs/day: 0.50    Years: 50.00    Types: Cigarettes    Quit date: 07/29/2011  . Smokeless tobacco: Never Used  . Alcohol use 0.0 oz/week     Comment:  socially, variable  . Drug use: No  . Sexual activity: Not Asked   Other Topics Concern  . None   Social History Narrative   Patient lives at home with spouse [name]   Patient has 3 children    Patient is right handed      Family History  Problem Relation Age of Onset  . Stroke Mother        mini strokes  . Alcohol abuse Father   . Heart disease Father        MI after 74  . Stroke Father   . Hypertension Father   . Heart attack Father   . Heart disease Paternal Aunt        several  . Hypertension Paternal Aunt        several  . Stroke Paternal Aunt        several  . Stroke Paternal Uncle        several  . Heart disease Paternal Uncle        several;2 had MI pre 81    Review of Systems  Constitutional: Negative for chills and fever.  Eyes: Positive for visual disturbance (poor vision related to glaucoma).  Respiratory: Positive for cough and shortness of breath (with moderate exercise). Negative for wheezing.   Cardiovascular: Positive for leg swelling (chronic). Negative for chest pain and palpitations.  Gastrointestinal: Negative for abdominal pain, blood in stool, constipation, diarrhea and nausea.       Occ GERD  Endocrine: Positive for cold intolerance.  Genitourinary: Negative for dysuria and hematuria.  Skin: Negative for rash.  Neurological: Negative for dizziness, light-headedness and headaches.       Objective:   Vitals:   05/03/17 1516  BP: 132/72  Pulse: 61  Resp: 16  Temp: 98.1 F (36.7 C)   Wt Readings from Last 3 Encounters:  05/03/17 209 lb (94.8 kg)  03/28/17 207 lb 4 oz (94 kg)  11/27/16 207 lb (93.9 kg)   Body mass index is 28.35 kg/m.   Physical Exam    Constitutional: He appears well-developed and well-nourished. No distress.  HENT:  Head: Normocephalic and atraumatic.  Right Ear: External ear normal.  Left Ear: External ear normal.  Mouth/Throat: Oropharynx is clear and moist.  Normal ear canals and TM b/l  Eyes: Conjunctivae and EOM are normal.  Neck: Neck supple. No tracheal deviation present. No thyromegaly present.  right carotid bruit  Cardiovascular: Normal rate, regular rhythm, normal heart sounds and intact distal pulses.  1/6 systolic  murmur heard. Pulmonary/Chest: Effort normal and breath sounds normal. No respiratory distress. He has no wheezes. He has no rales.  Abdominal: Soft. Ventral hernia - no tenderness. He exhibits no distension. There is no tenderness.  Genitourinary: deferred  Musculoskeletal: He exhibits no edema.  Lymphadenopathy:   He has no cervical adenopathy.  Skin: Skin is warm and dry. He is not diaphoretic.  Psychiatric: He has a normal mood and affect. His behavior is normal.      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 - good for age Skin  - sees derm twice a year Substance abuse  none  See Problem List for Assessment and Plan of chronic medical problems.

## 2017-05-03 ENCOUNTER — Ambulatory Visit (INDEPENDENT_AMBULATORY_CARE_PROVIDER_SITE_OTHER): Payer: Commercial Managed Care - PPO | Admitting: *Deleted

## 2017-05-03 ENCOUNTER — Ambulatory Visit (INDEPENDENT_AMBULATORY_CARE_PROVIDER_SITE_OTHER): Payer: Commercial Managed Care - PPO | Admitting: Internal Medicine

## 2017-05-03 ENCOUNTER — Encounter: Payer: Self-pay | Admitting: Internal Medicine

## 2017-05-03 VITALS — BP 132/72 | HR 61 | Temp 98.1°F | Resp 16 | Ht 72.0 in | Wt 209.0 lb

## 2017-05-03 DIAGNOSIS — I480 Paroxysmal atrial fibrillation: Secondary | ICD-10-CM

## 2017-05-03 DIAGNOSIS — C3491 Malignant neoplasm of unspecified part of right bronchus or lung: Secondary | ICD-10-CM | POA: Diagnosis not present

## 2017-05-03 DIAGNOSIS — R7303 Prediabetes: Secondary | ICD-10-CM

## 2017-05-03 DIAGNOSIS — R7309 Other abnormal glucose: Secondary | ICD-10-CM

## 2017-05-03 DIAGNOSIS — I5032 Chronic diastolic (congestive) heart failure: Secondary | ICD-10-CM | POA: Diagnosis not present

## 2017-05-03 DIAGNOSIS — I2510 Atherosclerotic heart disease of native coronary artery without angina pectoris: Secondary | ICD-10-CM | POA: Diagnosis not present

## 2017-05-03 DIAGNOSIS — I10 Essential (primary) hypertension: Secondary | ICD-10-CM | POA: Diagnosis not present

## 2017-05-03 DIAGNOSIS — J439 Emphysema, unspecified: Secondary | ICD-10-CM

## 2017-05-03 DIAGNOSIS — Z0001 Encounter for general adult medical examination with abnormal findings: Secondary | ICD-10-CM

## 2017-05-03 LAB — POCT GLYCOSYLATED HEMOGLOBIN (HGB A1C): HEMOGLOBIN A1C: 5.7

## 2017-05-03 NOTE — Patient Instructions (Addendum)
All other Health Maintenance issues reviewed.   All recommended immunizations and age-appropriate screenings are up-to-date or discussed.  No immunizations administered today.   Medications reviewed and updated.  No changes recommended at this time.    Please followup in 6 months    Health Maintenance, Male A healthy lifestyle and preventive care is important for your health and wellness. Ask your health care provider about what schedule of regular examinations is right for you. What should I know about weight and diet? Eat a Healthy Diet  Eat plenty of vegetables, fruits, whole grains, low-fat dairy products, and lean protein.  Do not eat a lot of foods high in solid fats, added sugars, or salt.  Maintain a Healthy Weight Regular exercise can help you achieve or maintain a healthy weight. You should:  Do at least 150 minutes of exercise each week. The exercise should increase your heart rate and make you sweat (moderate-intensity exercise).  Do strength-training exercises at least twice a week.  Watch Your Levels of Cholesterol and Blood Lipids  Have your blood tested for lipids and cholesterol every 5 years starting at 77 years of age. If you are at high risk for heart disease, you should start having your blood tested when you are 77 years old. You may need to have your cholesterol levels checked more often if: ? Your lipid or cholesterol levels are high. ? You are older than 77 years of age. ? You are at high risk for heart disease.  What should I know about cancer screening? Many types of cancers can be detected early and may often be prevented. Lung Cancer  You should be screened every year for lung cancer if: ? You are a current smoker who has smoked for at least 30 years. ? You are a former smoker who has quit within the past 15 years.  Talk to your health care provider about your screening options, when you should start screening, and how often you should be  screened.  Colorectal Cancer  Routine colorectal cancer screening usually begins at 77 years of age and should be repeated every 5-10 years until you are 77 years old. You may need to be screened more often if early forms of precancerous polyps or small growths are found. Your health care provider may recommend screening at an earlier age if you have risk factors for colon cancer.  Your health care provider may recommend using home test kits to check for hidden blood in the stool.  A small camera at the end of a tube can be used to examine your colon (sigmoidoscopy or colonoscopy). This checks for the earliest forms of colorectal cancer.  Prostate and Testicular Cancer  Depending on your age and overall health, your health care provider may do certain tests to screen for prostate and testicular cancer.  Talk to your health care provider about any symptoms or concerns you have about testicular or prostate cancer.  Skin Cancer  Check your skin from head to toe regularly.  Tell your health care provider about any new moles or changes in moles, especially if: ? There is a change in a mole's size, shape, or color. ? You have a mole that is larger than a pencil eraser.  Always use sunscreen. Apply sunscreen liberally and repeat throughout the day.  Protect yourself by wearing long sleeves, pants, a wide-brimmed hat, and sunglasses when outside.  What should I know about heart disease, diabetes, and high blood pressure?  If you  are 84-67 years of age, have your blood pressure checked every 3-5 years. If you are 72 years of age or older, have your blood pressure checked every year. You should have your blood pressure measured twice-once when you are at a hospital or clinic, and once when you are not at a hospital or clinic. Record the average of the two measurements. To check your blood pressure when you are not at a hospital or clinic, you can use: ? An automated blood pressure machine at a  pharmacy. ? A home blood pressure monitor.  Talk to your health care provider about your target blood pressure.  If you are between 28-65 years old, ask your health care provider if you should take aspirin to prevent heart disease.  Have regular diabetes screenings by checking your fasting blood sugar level. ? If you are at a normal weight and have a low risk for diabetes, have this test once every three years after the age of 3. ? If you are overweight and have a high risk for diabetes, consider being tested at a younger age or more often.  A one-time screening for abdominal aortic aneurysm (AAA) by ultrasound is recommended for men aged 24-75 years who are current or former smokers. What should I know about preventing infection? Hepatitis B If you have a higher risk for hepatitis B, you should be screened for this virus. Talk with your health care provider to find out if you are at risk for hepatitis B infection. Hepatitis C Blood testing is recommended for:  Everyone born from [birth year] through 1966.  Anyone with known risk factors for hepatitis C.  Sexually Transmitted Diseases (STDs)  You should be screened each year for STDs including gonorrhea and chlamydia if: ? You are sexually active and are younger than 77 years of age. ? You are older than 77 years of age and your health care provider tells you that you are at risk for this type of infection. ? Your sexual activity has changed since you were last screened and you are at an increased risk for chlamydia or gonorrhea. Ask your health care provider if you are at risk.  Talk with your health care provider about whether you are at high risk of being infected with HIV. Your health care provider may recommend a prescription medicine to help prevent HIV infection.  What else can I do?  Schedule regular health, dental, and eye exams.  Stay current with your vaccines (immunizations).  Do not use any tobacco products, such as  cigarettes, chewing tobacco, and e-cigarettes. If you need help quitting, ask your health care provider.  Limit alcohol intake to no more than 2 drinks per day. One drink equals 12 ounces of beer, 5 ounces of wine, or 1 ounces of hard liquor.  Do not use street drugs.  Do not share needles.  Ask your health care provider for help if you need support or information about quitting drugs.  Tell your health care provider if you often feel depressed.  Tell your health care provider if you have ever been abused or do not feel safe at home. This information is not intended to replace advice given to you by your health care provider. Make sure you discuss any questions you have with your health care provider. Document Released: 03/15/2008 Document Revised: 05/16/2016 Document Reviewed: 06/21/2015 Elsevier Interactive Patient Education  [employer].

## 2017-05-04 DIAGNOSIS — I5032 Chronic diastolic (congestive) heart failure: Secondary | ICD-10-CM | POA: Insufficient documentation

## 2017-05-04 NOTE — Assessment & Plan Note (Signed)
euvolemic on exam  Continue current medications 

## 2017-05-04 NOTE — Assessment & Plan Note (Signed)
Following with cardiology - on eliquis, statin, BP well controlled Encouraged healthy diet and regular exercise Recent CT scan shows severe 3 vessel CAD, had stress test 08/2016 Will follow up with cardiology

## 2017-05-04 NOTE — Assessment & Plan Note (Signed)
Paroxysmal Afib Follows with cardio Taking eliquis, rate controlled asymptomatic

## 2017-05-04 NOTE — Assessment & Plan Note (Signed)
Check a1c Low sugar / carb diet Stressed regular exercise   

## 2017-05-04 NOTE — Assessment & Plan Note (Signed)
BP well controlled Current regimen effective and well tolerated Continue current medications at current doses  

## 2017-05-04 NOTE — Assessment & Plan Note (Signed)
Chronic SOB with moderate exertion Following with pulmonary Does not require any inhalers

## 2017-05-07 NOTE — Progress Notes (Signed)
Carelink Summary Report / Loop Recorder 

## 2017-05-09 ENCOUNTER — Telehealth (HOSPITAL_COMMUNITY): Payer: Self-pay | Admitting: *Deleted

## 2017-05-09 NOTE — Telephone Encounter (Signed)
Patient called asking for his test results.  I called him back and left detailed VM per his request letting him know that the results are in Dr. Claris Gladden folder for review but he is out of the clinic until Monday.  I let him know that as soon as [name] reviews them we will call him to discuss.

## 2017-05-14 LAB — CUP PACEART REMOTE DEVICE CHECK
Implantable Pulse Generator Implant Date: 20161111
MDC IDC SESS DTM: 20180803204010

## 2017-05-28 ENCOUNTER — Telehealth (HOSPITAL_COMMUNITY): Payer: Self-pay | Admitting: *Deleted

## 2017-05-28 ENCOUNTER — Other Ambulatory Visit: Payer: Self-pay | Admitting: Internal Medicine

## 2017-05-28 DIAGNOSIS — R0600 Dyspnea, unspecified: Secondary | ICD-10-CM

## 2017-05-28 NOTE — Telephone Encounter (Signed)
Pt sent in his CT of chest done at [facility] on 04/19/17, it states "severe three-vessel coronary and mild to moderate aortic atherosclerotic calcifications.  The aorta, and pulmonary arteries are of normal size."  Dr Aundra Dubin discussed results w/pt via phone and recommends pt have lexiscan myoview due to increasing dyspnea.  Pt agreeable, order placed

## 2017-05-28 NOTE — Addendum Note (Signed)
Addended by: Scarlette Calico on: 05/28/2017 05:08 PM   Modules accepted: Orders

## 2017-06-04 ENCOUNTER — Ambulatory Visit (INDEPENDENT_AMBULATORY_CARE_PROVIDER_SITE_OTHER): Payer: Commercial Managed Care - PPO | Admitting: *Deleted

## 2017-06-04 DIAGNOSIS — I480 Paroxysmal atrial fibrillation: Secondary | ICD-10-CM

## 2017-06-05 ENCOUNTER — Telehealth (HOSPITAL_COMMUNITY): Payer: Self-pay | Admitting: Cardiology

## 2017-06-06 ENCOUNTER — Telehealth (HOSPITAL_COMMUNITY): Payer: Self-pay | Admitting: *Deleted

## 2017-06-06 NOTE — Telephone Encounter (Signed)
User: Cherie Dark A Date/time: 06/05/17 12:47 PM  Comment: Called Admin assistant and left a message for her to CB to try to sch patient for stress test.   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 type: Telephone Call type: Outgoing  Contact: [name] Relation to patient: Self    User: Cherie Dark A Date/time: 06/05/17 12:46 PM  Comment: Called pt and spoke with patient's wife and she gave me [name] Assistant's number([phone]) to try to reach him  Context:  Outcome: Completed  Phone number: [phone] Phone Type: Home Phone  Comm. type: Telephone Call type: Outgoing  Contact: [name] Relation to patient: Self

## 2017-06-06 NOTE — Telephone Encounter (Signed)
Left message on voicemail per DPR in reference to upcoming appointment scheduled on 06/11/17 with detailed instructions given per Myocardial Perfusion Study Information Sheet for the test. LM to arrive 15 minutes early, and that it is imperative to arrive on time for appointment to keep from having the test rescheduled. If you need to cancel or reschedule your appointment, please call the office within 24 hours of your appointment. Failure to do so may result in a cancellation of your appointment, and a $50 no show fee. Phone number given for call back for any questions. Troy [name] Jacqueline    

## 2017-06-06 NOTE — Progress Notes (Signed)
Carelink Summary Report / Loop Recorder 

## 2017-06-09 LAB — CUP PACEART REMOTE DEVICE CHECK
Date Time Interrogation Session: 20180902213817
MDC IDC PG IMPLANT DT: 20161111

## 2017-06-11 ENCOUNTER — Ambulatory Visit (HOSPITAL_COMMUNITY): Payer: Commercial Managed Care - PPO | Attending: Cardiology

## 2017-06-11 DIAGNOSIS — R9439 Abnormal result of other cardiovascular function study: Secondary | ICD-10-CM | POA: Insufficient documentation

## 2017-06-11 DIAGNOSIS — R0600 Dyspnea, unspecified: Secondary | ICD-10-CM | POA: Diagnosis present

## 2017-06-11 LAB — MYOCARDIAL PERFUSION IMAGING
CHL CUP RESTING HR STRESS: 55 {beats}/min
CSEPPHR: 69 {beats}/min
LVDIAVOL: 91 mL (ref 62–150)
LVSYSVOL: 28 mL
RATE: 0.34
SDS: 5
SRS: 7
SSS: 8
TID: 0.94

## 2017-06-11 MED ORDER — REGADENOSON 0.4 MG/5ML IV SOLN
0.4000 mg | Freq: Once | INTRAVENOUS | Status: AC
  Administered 2017-06-11: 0.4 mg via INTRAVENOUS

## 2017-06-11 MED ORDER — TECHNETIUM TC 99M TETROFOSMIN IV KIT
32.9000 | PACK | Freq: Once | INTRAVENOUS | Status: AC | PRN
  Administered 2017-06-11: 32.9 via INTRAVENOUS
  Filled 2017-06-11: qty 33

## 2017-06-11 MED ORDER — TECHNETIUM TC 99M TETROFOSMIN IV KIT
10.8000 | PACK | Freq: Once | INTRAVENOUS | Status: AC | PRN
  Administered 2017-06-11: 10.8 via INTRAVENOUS
  Filled 2017-06-11: qty 11

## 2017-06-12 ENCOUNTER — Telehealth (HOSPITAL_COMMUNITY): Payer: Self-pay

## 2017-06-12 NOTE — Telephone Encounter (Signed)
Pt is aware and agreeable. ------  Notes recorded by Larey Dresser, MD on 06/11/2017 at 4:34 PM EDT Normal EF. Low risk study, no ischemia. Looks like prior study. I think we can hold off on cardiac cath based on this.

## 2017-06-27 ENCOUNTER — Other Ambulatory Visit (HOSPITAL_COMMUNITY): Payer: Self-pay | Admitting: Cardiology

## 2017-07-02 ENCOUNTER — Ambulatory Visit (INDEPENDENT_AMBULATORY_CARE_PROVIDER_SITE_OTHER): Payer: Commercial Managed Care - PPO | Admitting: *Deleted

## 2017-07-02 DIAGNOSIS — I480 Paroxysmal atrial fibrillation: Secondary | ICD-10-CM | POA: Diagnosis not present

## 2017-07-03 NOTE — Progress Notes (Signed)
Carelink Summary Report / Loop Recorder 

## 2017-07-04 LAB — CUP PACEART REMOTE DEVICE CHECK
Date Time Interrogation Session: 20181002220844
MDC IDC PG IMPLANT DT: 20161111

## 2017-08-01 ENCOUNTER — Ambulatory Visit (INDEPENDENT_AMBULATORY_CARE_PROVIDER_SITE_OTHER): Payer: Commercial Managed Care - PPO | Admitting: *Deleted

## 2017-08-01 DIAGNOSIS — I480 Paroxysmal atrial fibrillation: Secondary | ICD-10-CM

## 2017-08-02 NOTE — Progress Notes (Signed)
Carelink Summary Report / Loop Recorder 

## 2017-08-05 ENCOUNTER — Encounter (HOSPITAL_COMMUNITY): Payer: Self-pay | Admitting: Cardiology

## 2017-08-05 ENCOUNTER — Ambulatory Visit (HOSPITAL_COMMUNITY)
Admission: RE | Admit: 2017-08-05 | Discharge: 2017-08-05 | Disposition: A | Discharge status: Home / Self Care | Payer: Commercial Managed Care - PPO | Source: Ambulatory Visit | Attending: Cardiology | Admitting: Cardiology

## 2017-08-05 ENCOUNTER — Other Ambulatory Visit (HOSPITAL_COMMUNITY): Payer: Self-pay

## 2017-08-05 ENCOUNTER — Encounter (HOSPITAL_COMMUNITY): Payer: Self-pay

## 2017-08-05 VITALS — BP 134/66 | HR 60 | Wt 212.0 lb

## 2017-08-05 DIAGNOSIS — I130 Hypertensive heart and chronic kidney disease with heart failure and stage 1 through stage 4 chronic kidney disease, or unspecified chronic kidney disease: Secondary | ICD-10-CM | POA: Insufficient documentation

## 2017-08-05 DIAGNOSIS — I6529 Occlusion and stenosis of unspecified carotid artery: Secondary | ICD-10-CM | POA: Insufficient documentation

## 2017-08-05 DIAGNOSIS — Z85118 Personal history of other malignant neoplasm of bronchus and lung: Secondary | ICD-10-CM | POA: Diagnosis not present

## 2017-08-05 DIAGNOSIS — Z7901 Long term (current) use of anticoagulants: Secondary | ICD-10-CM | POA: Diagnosis not present

## 2017-08-05 DIAGNOSIS — I714 Abdominal aortic aneurysm, without rupture: Secondary | ICD-10-CM | POA: Insufficient documentation

## 2017-08-05 DIAGNOSIS — I5032 Chronic diastolic (congestive) heart failure: Secondary | ICD-10-CM | POA: Diagnosis not present

## 2017-08-05 DIAGNOSIS — J449 Chronic obstructive pulmonary disease, unspecified: Secondary | ICD-10-CM | POA: Diagnosis not present

## 2017-08-05 DIAGNOSIS — Z87891 Personal history of nicotine dependence: Secondary | ICD-10-CM | POA: Insufficient documentation

## 2017-08-05 DIAGNOSIS — I739 Peripheral vascular disease, unspecified: Secondary | ICD-10-CM | POA: Diagnosis not present

## 2017-08-05 DIAGNOSIS — J439 Emphysema, unspecified: Secondary | ICD-10-CM | POA: Diagnosis not present

## 2017-08-05 DIAGNOSIS — E785 Hyperlipidemia, unspecified: Secondary | ICD-10-CM | POA: Insufficient documentation

## 2017-08-05 DIAGNOSIS — R0600 Dyspnea, unspecified: Secondary | ICD-10-CM | POA: Insufficient documentation

## 2017-08-05 DIAGNOSIS — G4733 Obstructive sleep apnea (adult) (pediatric): Secondary | ICD-10-CM | POA: Diagnosis not present

## 2017-08-05 DIAGNOSIS — I2510 Atherosclerotic heart disease of native coronary artery without angina pectoris: Secondary | ICD-10-CM | POA: Diagnosis not present

## 2017-08-05 DIAGNOSIS — Z8673 Personal history of transient ischemic attack (TIA), and cerebral infarction without residual deficits: Secondary | ICD-10-CM | POA: Insufficient documentation

## 2017-08-05 DIAGNOSIS — I480 Paroxysmal atrial fibrillation: Secondary | ICD-10-CM | POA: Diagnosis not present

## 2017-08-05 DIAGNOSIS — H409 Unspecified glaucoma: Secondary | ICD-10-CM | POA: Diagnosis not present

## 2017-08-05 DIAGNOSIS — I5022 Chronic systolic (congestive) heart failure: Secondary | ICD-10-CM

## 2017-08-05 DIAGNOSIS — N189 Chronic kidney disease, unspecified: Secondary | ICD-10-CM | POA: Diagnosis not present

## 2017-08-05 DIAGNOSIS — R0602 Shortness of breath: Secondary | ICD-10-CM

## 2017-08-05 DIAGNOSIS — Z79899 Other long term (current) drug therapy: Secondary | ICD-10-CM | POA: Insufficient documentation

## 2017-08-05 LAB — COMPREHENSIVE METABOLIC PANEL
ALT: 21 U/L (ref 17–63)
ANION GAP: 7 (ref 5–15)
AST: 23 U/L (ref 15–41)
Albumin: 3.5 g/dL (ref 3.5–5.0)
Alkaline Phosphatase: 73 U/L (ref 38–126)
BUN: 11 mg/dL (ref 6–20)
CHLORIDE: 106 mmol/L (ref 101–111)
CO2: 25 mmol/L (ref 22–32)
CREATININE: 1.34 mg/dL — AB (ref 0.61–1.24)
Calcium: 9.2 mg/dL (ref 8.9–10.3)
GFR, EST AFRICAN AMERICAN: 58 mL/min — AB (ref >60)
GFR, EST NON AFRICAN AMERICAN: 50 mL/min — AB (ref >60)
Glucose, Bld: 156 mg/dL — ABNORMAL HIGH (ref 65–99)
POTASSIUM: 4.6 mmol/L (ref 3.5–5.1)
Sodium: 138 mmol/L (ref 135–145)
Total Bilirubin: 0.5 mg/dL (ref 0.3–1.2)
Total Protein: 6.8 g/dL (ref 6.5–8.1)

## 2017-08-05 LAB — CUP PACEART REMOTE DEVICE CHECK
Date Time Interrogation Session: 20181101220838
MDC IDC PG IMPLANT DT: 20161111

## 2017-08-05 LAB — TSH: TSH: 1.519 u[IU]/mL (ref 0.350–4.500)

## 2017-08-05 LAB — CBC
HCT: 38.2 % — ABNORMAL LOW (ref 39.0–52.0)
Hemoglobin: 12.4 g/dL — ABNORMAL LOW (ref 13.0–17.0)
MCH: 29.3 pg (ref 26.0–34.0)
MCHC: 32.5 g/dL (ref 30.0–36.0)
MCV: 90.3 fL (ref 78.0–100.0)
PLATELETS: 243 10*3/uL (ref 150–400)
RBC: 4.23 MIL/uL (ref 4.22–5.81)
RDW: 14.3 % (ref 11.5–15.5)
WBC: 5.6 10*3/uL (ref 4.0–10.5)

## 2017-08-05 LAB — PROTIME-INR
INR: 1.16
Prothrombin Time: 14.8 seconds (ref 11.4–15.2)

## 2017-08-05 LAB — SEDIMENTATION RATE: SED RATE: 60 mm/h — AB (ref 0–16)

## 2017-08-05 NOTE — Patient Instructions (Signed)
Labs drawn today (if we do not call you, then your lab work was stable)   Your physician has recommended that you have a pulmonary function test. Pulmonary Function Tests are a group of tests that measure how well air moves in and out of your lungs.  You have been referred to Pulmonology Dr. Melvyn Novas  Your physician has requested that you have a cardiac catheterization. Cardiac catheterization is used to diagnose and/or treat various heart conditions. Doctors may recommend this procedure for a number of different reasons. The most common reason is to evaluate chest pain. Chest pain can be a symptom of coronary artery disease (CAD), and cardiac catheterization can show whether plaque is narrowing or blocking your heart's arteries. This procedure is also used to evaluate the valves, as well as measure the blood flow and oxygen levels in different parts of your heart. For further information please visit [email or web address]. Please follow instruction sheet, as given.  Your physician recommends that you schedule a follow-up appointment in: January 2019

## 2017-08-06 NOTE — H&P (View-Only) (Signed)
Patient ID: Troy Quakenbush [name], male   DOB: 12/13/1940, 76 y.o.   MRN: [id number] PCP: Dr. Burns Cardiology: Dr. Judeen Geralds  76 yo with history of HTN, COPD, active smoking/COPD, carotid stenosis s/p right CEA, paroxysmal atrial fibrillation, and PAD returns for followup of CHF and atrial fibrillation.  He does not have known obstructive CAD but is at high risk for CAD based on his comorbidities.  Lexiscan Cardiolite in 3/14 showed no ischemia or infarction and echo in 3/14 showed normal EF.  He had a left fem-pop bypass at the [facility] in the '90s.  He is followed at [facility] for PAD. He has chronic right calf/thigh/buttocks claudication that is unchanged over the last few years and follows regularly at [facility].  He has had right lower lobectomy for lung cancer.  He continues to stay off cigarettes.    At a prior appointment, he was in atrial fibrillation but did not realize it.  I started him on Eliquis and diltiazem CD, and he spontaneously converted to NSR.  In 5/15, he was at [name] and felt "strange" one day: fatigued, weak, short of breath.  He went to the ER and was in atrial fibrillation with HR in 80s-90s.  He spontaneously converted to NSR in the ER.  He felt back to normal after converting to NSR.  I started him on Multaq 400 mg bid.  With CHF, he was eventually transitioned over to amiodarone.   He had patch angioplasty revision of left fem-pop bypass in 11/16.  Now with minimal claudication.  He follows with [facility].     He had a Cardiolite and echo in 7/17 that were unremarkable.  Repeat Cardiolite in 12/17 showed no ischemia, echo was uninterpretable.  Cardiac MRI was therefore done in 1/18, showing EF 66% with normal-appearing RV, no late gadolinium enhancement.   For the last 4 months, he has felt gradually more short of breath with exertion.  Currently, short of breath getting dressed, walking up stairs, walking about 1 block.  No chest pain.  Stable claudication.  During this time, he had a  screening CT for lung cancer that showed coronary calcification.  Therefore, he had Cardiolite done in 9/18.  This showed EF >65% with fixed inferior defect (attenuation versus infarct), no definite ischemia.   ECG (personally reviewed): NSR, low voltage Ps  Labs (3/13): LDL 75, HDL 35, K 4.1, creatinine 1.1 Labs (3/14): LDL 91, HDL 27 Labs (10/14): K 4.3, creatinine 0.98 Labs (4/15): K 4.9, creatinine 1.1, LDL 76, HDL 30 Labs (5/15): K 4.3, creatinine 1.7, BNP 251 Labs (6/15): K 4.4, creatinine 1.3 Labs (10/15): TSH normal Labs (5/16): K 4.2, creatinine 1.12, HCT 39.5, LFTs normal, LDL 84, HDL 43, TSH normal Labs (11/16): creatinine 0.94 Labs (4/17): LDL 39, HDL 39 Labs (6/17): K 4.5, creatinine 1.2 Labs (9/17): K 4, creatinine 1.25, HCT 38.7 Labs (12/17): K 4.5, creatinine 1.49 => 1.62, BNP 43 Labs (2/18): K 3.8, creatinine 1.33, LFTs normal, TSH normal Labs (6/18): K 4.3, creatinine 1.49, LFTs normal, TSH normal, hgb 12.3  PMH: 1. HTN: Fatigue and cough with ramipril use.  2. COPD: Quit smoking 2014. PFTs (1/18) with moderately severe COPD.  3. AAA: CT 1/13 with 3.0 cm AAA.  Abdominal US (1/14) with 3.4 cm AAA. Abdominal US (4/15) with 3.25 x 3.27 AAA. Abdominal US (3/16) with 3.7 cm AAA.  Followed at [facility].  4. Squamous cell lung cancer diagnosed 2/12.  Had right lower lobectomy in 4/12.  5. Hyperlipidemia   6. PAD: Left fem-pop bypass 1994.  ABIs (2/12) 0.62 on right, 0.95 on left. ABIs (1/14): 0.65 on right, 1.04 on left. ABIs (4/15) 0.66 right, 0.87 left.  ABIs (3/16) 0.6 right, 0.88 left.  Patch angioplasty left fem-pop bypass in 11/16.  - Left fem-pop bypass patent on doppler evaluation in 12/17.  7. Stress myoview 2004 was normal. Lexiscan Cardiolite (3/14) with EF 66%, no ischemia or infarction. Lexiscan Cardiolite (7/17) with EF 61%, no ischemia/infarction.  - Cardiolite (12/17) with EF 62%, inferior/inferolateral fixed defect, most likely diaphragmatic attenuation, no ischemia.   - Cardiolite (9/18) with EF > 65%, fixed inferior defect (attenuation versus infarction), no ischemia.  8. Chronic diastolic CHF: Echo (3/14): technically difficult with EF 55-60%, upper normal RV size.  Echo (5/16) with EF 60-65%.  Echo (7/17) with EF 55-60%, normal RV size and systolic function.  - Echo 12/17 with very poor windows, unable to comment on LV or RV function.  - Cardiac MRI (1/18) with EF 66%, normal RV size and systolic function.  9. Carotid stenosis: TIA 10/14.  Carotid dopplers with > 80% RICA, 60-79% LICA.  Patient had right CEA in 10/14. Carotids (4/15) patent right CEA, LICA 40-59% stenosis.  Carotids (10/15) < 40% LICA, patent right CEA.  - Carotid dopplers (12/17): right CEA ok, < 39% LICA stenosis.  10. Atrial fibrillation: Paroxysmal. First noted after lobectomy in 4/12 (brief), recurrence in 4/15 then in 5/15.  11. Spinal stenosis 12. OSA: Using CPAP.  13. Glaucoma  SH: Married, lives in [place].  Former [employer].  Quit smoking in 2014.  Rare ETOH now.   FH: No premature CAD  ROS: All systems reviewed and negative except as per HPI.   Current Outpatient Medications  Medication Sig Dispense Refill  . amiodarone (PACERONE) 100 MG tablet Take 1 tablet (100 mg total) by mouth daily. 30 tablet 3  . apixaban (ELIQUIS) 5 MG TABS tablet Take 1 tablet (5 mg total) by mouth 2 (two) times daily. 60 tablet 8  . bimatoprost (LUMIGAN) 0.01 % SOLN Place 1 drop into the left eye at bedtime.    . Biotin 5000 MCG TABS Take 5,000 mcg by mouth daily.    . bisacodyl (DULCOLAX) 5 MG EC tablet Take 5 mg by mouth daily as needed for moderate constipation.    . brimonidine (ALPHAGAN P) 0.1 % SOLN Place 3 drops into the left eye 3 (three) times daily.     . CARDURA 4 MG tablet TAKE (1/2) TABLET DAILY. 15 tablet 3  . diltiazem (CARDIZEM CD) 120 MG 24 hr capsule TAKE 1 CAPSULE DAILY. 90 capsule 3  . dorzolamide-timolol (COSOPT) 22.3-6.8 MG/ML ophthalmic solution Place  1 drop into the left eye 3 (three) times daily.     . FLUoxetine (PROZAC) 20 MG capsule TAKE (1) CAPSULE DAILY. 30 capsule 2  . furosemide (LASIX) 40 MG tablet Take 0.5-1 tablets (20-40 mg total) by mouth as directed. Take 40 mg daily alternating with 20 mg daily 45 tablet 3  . Ginkgo Biloba 40 MG TABS Take 40 mg by mouth daily.     . iron polysaccharides (POLY-IRON 150) 150 MG capsule Take 150 mg by mouth daily.    . Melatonin 5 MG TABS Take 5 mg by mouth at bedtime.    . Multiple Vitamin (MULTIVITAMIN WITH MINERALS) TABS tablet Take 1 tablet by mouth daily. Centrum Silver    . potassium chloride SA (K-DUR,KLOR-CON) 20 MEQ tablet TAKE 1 TABLET ONCE DAILY. 90   tablet 3  . ranitidine (ZANTAC) 150 MG tablet Take 150 mg by mouth daily as needed for heartburn.     . rosuvastatin (CRESTOR) 40 MG tablet Take 1 tablet (40 mg total) by mouth daily. 90 tablet 3   No current facility-administered medications for this encounter.     BP 134/66   Pulse 60   Wt 212 lb (96.2 kg)   SpO2 96%   BMI 28.75 kg/m  General: NAD Neck: No JVD, no thyromegaly or thyroid nodule.  Lungs: Decreased breath sounds right base. CV: Nondisplaced PMI.  Heart regular S1/S2, no S3/S4, no murmur.  No peripheral edema.  No carotid bruit.  Normal pedal pulses.  Abdomen: Soft, nontender, no hepatosplenomegaly, no distention.  Skin: Intact without lesions or rashes.  Neurologic: Alert and oriented x 3.  Psych: Normal affect. Extremities: No clubbing or cyanosis.  HEENT: Normal.   Assessment/Plan: 1. Chronic diastolic CHF: Cardiac MRI done in 1/18 due to uninterpretable echo.  This showed LV EF 66% and normal RV size and systolic function.  He does not look volume overloaded on exam today.  - Continue Lasix 40 mg daily alternating with 20 mg daily. BMET today.  2. Hyperlipidemia: Continue Crestor.  Good lipids in 2/18.   3. Carotid stenosis: s/p R CEA.  Dopplers followed at [facility].  4. PAD: s/p patch angioplasty to left  fem-pop bypass in 11/16.  Followed at [facility]. He is off cilostazol with CHF but no increase in mild stable claudication.     5. AAA: Stable on recent US, followed at [facility].    6. COPD: No longer smoking.  Increased dyspnea in recent months could be related to worsening COPD.  PFTs in 1/18 showed moderately severe obstructive defect.  He is not on inhalers due to glaucoma.   - I will arrange for repeat PFTs.  - I would like him to see pulmonary soon.  7. Atrial fibrillation: Patient remains in NSR on amiodarone.  CHADSVASC 3 (age, vascular disease, HTN).  He is also on diltiazem CD 120 mg daily.   - Limit ETOH.  - Use CPAP.  - Continue Eliquis. CBC today.  - He is maintaining NSR on amiodarone (had to stop Multaq due to CHF), dose down to 100 mg daily. Check LFTs and TSH today.  He gets regular eye exams. Given increased dyspnea, I have some concern for amiodarone lung toxicity.  As above, I am going to get repeat PFTs.  I will also check ESR.  I am arranging for him to see pulmonary.  8. CKD: BMET today.  9. CAD: Noted on CT chest recently (coronary calcium).  Cardiolite with fixed inferior defect (infarction versus attenuation), no definite ischemia.  I am concerned that worsening dyspnea could be related to coronary disease.  With ongoing symptoms and known CAD by CT, I think it would be reasonable to set him up for left heart cath.  We discussed risks/benefits today and he agrees to proceed.  Hold Eliquis 2 days prior to cath.  10. Dyspnea: See above discussion.  Worsening COPD versus amiodarone lung toxicity versus ischemic equivalent from CAD.     Followup after cath.    [name] 08/06/2017

## 2017-08-06 NOTE — Progress Notes (Addendum)
Patient ID: Troy Blanton [name], male   DOB: 11-19-1940, 76 y.o.   MRN: [id number] PCP: Dr. Quay Burow Cardiology: [name]  76 yo with history of HTN, COPD, active smoking/COPD, carotid stenosis s/p right CEA, paroxysmal atrial fibrillation, and PAD returns for followup of CHF and atrial fibrillation.  He does not have known obstructive CAD but is at high risk for CAD based on his comorbidities.  Lexiscan Cardiolite in 3/14 showed no ischemia or infarction and echo in 3/14 showed normal EF.  He had a left fem-pop bypass at the [facility] in the '90s.  He is followed at [facility] for PAD. He has chronic right calf/thigh/buttocks claudication that is unchanged over the last few years and follows regularly at [facility].  He has had right lower lobectomy for lung cancer.  He continues to stay off cigarettes.    At a prior appointment, he was in atrial fibrillation but did not realize it.  I started him on Eliquis and diltiazem CD, and he spontaneously converted to NSR.  In 5/15, he was at [facility] and felt "strange" one day: fatigued, weak, short of breath.  He went to the ER and was in atrial fibrillation with HR in 80s-90s.  He spontaneously converted to NSR in the ER.  He felt back to normal after converting to NSR.  I started him on Multaq 400 mg bid.  With CHF, he was eventually transitioned over to amiodarone.   He had patch angioplasty revision of left fem-pop bypass in 11/16.  Now with minimal claudication.  He follows with [facility].     He had a Cardiolite and echo in 7/17 that were unremarkable.  Repeat Cardiolite in 12/17 showed no ischemia, echo was uninterpretable.  Cardiac MRI was therefore done in 1/18, showing EF 66% with normal-appearing RV, no late gadolinium enhancement.   For the last 4 months, he has felt gradually more short of breath with exertion.  Currently, short of breath getting dressed, walking up stairs, walking about 1 block.  No chest pain.  Stable claudication.  During this time, he had a  screening CT for lung cancer that showed coronary calcification.  Therefore, he had Cardiolite done in 9/18.  This showed EF >65% with fixed inferior defect (attenuation versus infarct), no definite ischemia.   ECG (personally reviewed): NSR, low voltage Ps  Labs (3/13): LDL 75, HDL 35, K 4.1, creatinine 1.1 Labs (3/14): LDL 91, HDL 27 Labs (10/14): K 4.3, creatinine 0.98 Labs (4/15): K 4.9, creatinine 1.1, LDL 76, HDL 30 Labs (5/15): K 4.3, creatinine 1.7, BNP 251 Labs (6/15): K 4.4, creatinine 1.3 Labs (10/15): TSH normal Labs (5/16): K 4.2, creatinine 1.12, HCT 39.5, LFTs normal, LDL 84, HDL 43, TSH normal Labs (11/16): creatinine 0.94 Labs (4/17): LDL 39, HDL 39 Labs (6/17): K 4.5, creatinine 1.2 Labs (9/17): K 4, creatinine 1.25, HCT 38.7 Labs (12/17): K 4.5, creatinine 1.49 => 1.62, BNP 43 Labs (2/18): K 3.8, creatinine 1.33, LFTs normal, TSH normal Labs (6/18): K 4.3, creatinine 1.49, LFTs normal, TSH normal, hgb 12.3  PMH: 1. HTN: Fatigue and cough with ramipril use.  2. COPD: Quit smoking 2014. PFTs (1/18) with moderately severe COPD.  3. AAA: CT 1/13 with 3.0 cm AAA.  Abdominal US (1/14) with 3.4 cm AAA. Abdominal US (4/15) with 3.25 x 3.27 AAA. Abdominal US (3/16) with 3.7 cm AAA.  Followed at [facility].  4. Squamous cell lung cancer diagnosed 2/12.  Had right lower lobectomy in 4/12.  5. Hyperlipidemia  6. PAD: Left fem-pop bypass 1994.  ABIs (2/12) 0.62 on right, 0.95 on left. ABIs (1/14): 0.65 on right, 1.04 on left. ABIs (4/15) 0.66 right, 0.87 left.  ABIs (3/16) 0.6 right, 0.88 left.  Patch angioplasty left fem-pop bypass in 11/16.  - Left fem-pop bypass patent on doppler evaluation in 12/17.  7. Stress myoview 2004 was normal. Lexiscan Cardiolite (3/14) with EF 66%, no ischemia or infarction. Lexiscan Cardiolite (7/17) with EF 61%, no ischemia/infarction.  - Cardiolite (12/17) with EF 62%, inferior/inferolateral fixed defect, most likely diaphragmatic attenuation, no ischemia.   - Cardiolite (9/18) with EF > 65%, fixed inferior defect (attenuation versus infarction), no ischemia.  8. Chronic diastolic CHF: Echo (3/41): technically difficult with EF 55-60%, upper normal RV size.  Echo (5/16) with EF 60-65%.  Echo (7/17) with EF 55-60%, normal RV size and systolic function.  - Echo 12/17 with very poor windows, unable to comment on LV or RV function.  - Cardiac MRI (1/18) with EF 66%, normal RV size and systolic function.  9. Carotid stenosis: TIA 10/14.  Carotid dopplers with > 96% RICA, 22-29% LICA.  Patient had right CEA in 10/14. Carotids (4/15) patent right CEA, LICA 79-89% stenosis.  Carotids (21/19) < 41% LICA, patent right CEA.  - Carotid dopplers (12/17): right CEA ok, < 74% LICA stenosis.  10. Atrial fibrillation: Paroxysmal. First noted after lobectomy in 4/12 (brief), recurrence in 4/15 then in 5/15.  11. Spinal stenosis 12. OSA: Using CPAP.  76. Glaucoma  SH: Married, lives in [place].  Former [place].  Quit smoking in 2014.  Rare ETOH now.   FH: No premature CAD  ROS: All systems reviewed and negative except as per HPI.   Current Outpatient Medications  Medication Sig Dispense Refill  . amiodarone (PACERONE) 100 MG tablet Take 1 tablet (100 mg total) by mouth daily. 30 tablet 3  . apixaban (ELIQUIS) 5 MG TABS tablet Take 1 tablet (5 mg total) by mouth 2 (two) times daily. 60 tablet 8  . bimatoprost (LUMIGAN) 0.01 % SOLN Place 1 drop into the left eye at bedtime.    . Biotin 5000 MCG TABS Take 5,000 mcg by mouth daily.    . bisacodyl (DULCOLAX) 5 MG EC tablet Take 5 mg by mouth daily as needed for moderate constipation.    . brimonidine (ALPHAGAN P) 0.1 % SOLN Place 3 drops into the left eye 3 (three) times daily.     [name] CARDURA 4 MG tablet TAKE (1/2) TABLET DAILY. 15 tablet 3  . diltiazem (CARDIZEM CD) 120 MG 24 hr capsule TAKE 1 CAPSULE DAILY. 90 capsule 3  . dorzolamide-timolol (COSOPT) 22.3-6.8 MG/ML ophthalmic solution Place  1 drop into the left eye 3 (three) times daily.     [name] FLUoxetine (PROZAC) 20 MG capsule TAKE (1) CAPSULE DAILY. 30 capsule 2  . furosemide (LASIX) 40 MG tablet Take 0.5-1 tablets (20-40 mg total) by mouth as directed. Take 40 mg daily alternating with 20 mg daily 45 tablet 3  . Ginkgo Biloba 40 MG TABS Take 40 mg by mouth daily.     . iron polysaccharides (POLY-IRON 150) 150 MG capsule Take 150 mg by mouth daily.    . Melatonin 5 MG TABS Take 5 mg by mouth at bedtime.    . Multiple Vitamin (MULTIVITAMIN WITH MINERALS) TABS tablet Take 1 tablet by mouth daily. Centrum Silver    . potassium chloride SA (K-DUR,KLOR-CON) 20 MEQ tablet TAKE 1 TABLET ONCE DAILY. [place]  tablet 3  . ranitidine (ZANTAC) 150 MG tablet Take 150 mg by mouth daily as needed for heartburn.     . rosuvastatin (CRESTOR) 40 MG tablet Take 1 tablet (40 mg total) by mouth daily. 90 tablet 3   No current facility-administered medications for this encounter.     BP 134/66   Pulse 60   Wt 212 lb (96.2 kg)   SpO2 96%   BMI 28.75 kg/m  General: NAD Neck: No JVD, no thyromegaly or thyroid nodule.  Lungs: Decreased breath sounds right base. CV: Nondisplaced PMI.  Heart regular S1/S2, no S3/S4, no murmur.  No peripheral edema.  No carotid bruit.  Normal pedal pulses.  Abdomen: Soft, nontender, no hepatosplenomegaly, no distention.  Skin: Intact without lesions or rashes.  Neurologic: Alert and oriented x 3.  Psych: Normal affect. Extremities: No clubbing or cyanosis.  HEENT: Normal.   Assessment/Plan: 1. Chronic diastolic CHF: Cardiac MRI done in 1/18 due to uninterpretable echo.  This showed LV EF 66% and normal RV size and systolic function.  He does not look volume overloaded on exam today.  - Continue Lasix 40 mg daily alternating with 20 mg daily. BMET today.  2. Hyperlipidemia: Continue Crestor.  Good lipids in 2/18.   3. Carotid stenosis: s/p R CEA.  Dopplers followed at [facility].  4. PAD: s/p patch angioplasty to left  fem-pop bypass in 11/16.  Followed at [facility]. He is off cilostazol with CHF but no increase in mild stable claudication.     5. AAA: Stable on recent US, followed at [facility].    6. COPD: No longer smoking.  Increased dyspnea in recent months could be related to worsening COPD.  PFTs in 1/18 showed moderately severe obstructive defect.  He is not on inhalers due to glaucoma.   - I will arrange for repeat PFTs.  - I would like him to see pulmonary soon.  7. Atrial fibrillation: Patient remains in NSR on amiodarone.  CHADSVASC 3 (age, vascular disease, HTN).  He is also on diltiazem CD 120 mg daily.   - Limit ETOH.  - Use CPAP.  - Continue Eliquis. CBC today.  - He is maintaining NSR on amiodarone (had to stop Multaq due to CHF), dose down to 100 mg daily. Check LFTs and TSH today.  He gets regular eye exams. Given increased dyspnea, I have some concern for amiodarone lung toxicity.  As above, I am going to get repeat PFTs.  I will also check ESR.  I am arranging for him to see pulmonary.  8. CKD: BMET today.  9. CAD: Noted on CT chest recently (coronary calcium).  Cardiolite with fixed inferior defect (infarction versus attenuation), no definite ischemia.  I am concerned that worsening dyspnea could be related to coronary disease.  With ongoing symptoms and known CAD by CT, I think it would be reasonable to set him up for left heart cath.  We discussed risks/benefits today and he agrees to proceed.  Hold Eliquis 2 days prior to cath.  10. Dyspnea: See above discussion.  Worsening COPD versus amiodarone lung toxicity versus ischemic equivalent from CAD.     Followup after cath.    [name] 08/06/2017

## 2017-08-06 NOTE — Addendum Note (Signed)
Encounter addended by: Larey Dresser, MD on: 08/06/2017 12:52 AM  Actions taken: Sign clinical note

## 2017-08-07 ENCOUNTER — Other Ambulatory Visit (HOSPITAL_COMMUNITY): Payer: Self-pay

## 2017-08-07 DIAGNOSIS — J441 Chronic obstructive pulmonary disease with (acute) exacerbation: Secondary | ICD-10-CM

## 2017-08-08 ENCOUNTER — Ambulatory Visit (INDEPENDENT_AMBULATORY_CARE_PROVIDER_SITE_OTHER): Payer: Commercial Managed Care - PPO | Admitting: Internal Medicine

## 2017-08-08 ENCOUNTER — Encounter: Payer: Self-pay | Admitting: Internal Medicine

## 2017-08-08 VITALS — BP 116/68 | HR 64 | Ht 73.0 in | Wt 214.4 lb

## 2017-08-08 DIAGNOSIS — J432 Centrilobular emphysema: Secondary | ICD-10-CM | POA: Diagnosis not present

## 2017-08-08 DIAGNOSIS — C3491 Malignant neoplasm of unspecified part of right bronchus or lung: Secondary | ICD-10-CM

## 2017-08-08 NOTE — Progress Notes (Signed)
Subjective:     Patient ID: Troy Kenner [name], male   DOB: [birth year],     MRN: [id number]     Brief patient profile:  58 yowm quit smoking Oct 2014 s/p RLLobecty 2012 at [facility] with Ib Sq cell ca and no endobronchial dz at that time and with no RT / chemo being followed by [facility] / [facility] with last ct chest done in 03/2014 showing persistent moderate R effusion  due for f/u 04/2015 with new onset gen persistent   fatigue winter of 2015 then very abruptly ill 01/08/15 sore throat, cough congestion yellow mucus, light headed rx doxy / pred and referrred to pulmonary clninic 02/03/2015 by Dr Linna Darner > dx was pna> cleared  Out then f/u ct at [name] 04/19/17 showed ? Nodules in RLL and referred himself to pulmonary clinic 08/08/2017    08/08/2017  ov/Tyreanna Bisesi re:  Consultation req by Dr Aundra Dubin  Chief Complaint  Patient presents with  . Follow-up    Pt is having SOB with exertion for last 6 months becoming bothersome. Pt is wheezing and has dry cough.   doe = sob getting dressed x 4-5 months  Sleeps ok Legs R > L claudication stops him before his breathing does  On amiodarone  X  03/05/17 but only 100 mg sicne 03/28/17    No obvious day to day or daytime variability or assoc excess/ purulent sputum or mucus plugs or hemoptysis or cp or chest tightness,  or overt sinus or hb symptoms. No unusual exp hx or h/o childhood pna/ asthma or knowledge of premature birth.  Sleeping ok flat without nocturnal  or early am exacerbation  of respiratory  c/o's or need for noct saba. Also denies any obvious fluctuation of symptoms with weather or environmental changes or other aggravating or alleviating factors except as outlined above   Current Allergies, Complete Past Medical History, Past Surgical History, Family History, and Social History were reviewed in [employer] record.  ROS  The following are not active complaints unless bolded Hoarseness, sore throat, dysphagia, dental problems, itching,  sneezing,  nasal congestion or discharge of excess mucus or purulent secretions, ear ache,   fever, chills, sweats, unintended wt loss or wt gain, classically pleuritic or exertional cp,  orthopnea pnd or leg swelling, presyncope, palpitations, abdominal pain, anorexia, nausea, vomiting, diarrhea  or change in bowel habits or change in bladder habits, change in stools or change in urine, dysuria, hematuria,  rash, arthralgias, visual complaints, headache, numbness, weakness or ataxia or problems with walking or coordination,  change in mood/affect or memory.        Current Meds  Medication Sig  . amiodarone (PACERONE) 100 MG tablet Take 1 tablet (100 mg total) by mouth daily.  [name] apixaban (ELIQUIS) 5 MG TABS tablet Take 1 tablet (5 mg total) by mouth 2 (two) times daily.  . bimatoprost (LUMIGAN) 0.01 % SOLN Place 1 drop into the left eye at bedtime.  . Biotin 5000 MCG TABS Take 5,000 mcg by mouth daily.  . brimonidine (ALPHAGAN P) 0.1 % SOLN Place 3 drops into the left eye 3 (three) times daily.   [name] CARDURA 4 MG tablet TAKE (1/2) TABLET DAILY.  [name] diltiazem (CARDIZEM CD) 120 MG 24 hr capsule TAKE 1 CAPSULE DAILY.  [name] dorzolamide-timolol (COSOPT) 22.3-6.8 MG/ML ophthalmic solution Place 1 drop into the left eye 3 (three) times daily.   [name] FLUoxetine (PROZAC) 20 MG capsule TAKE (1) CAPSULE DAILY.  . furosemide (LASIX) 40  MG tablet Take 0.5-1 tablets (20-40 mg total) by mouth as directed. Take 40 mg daily alternating with 20 mg daily  . iron polysaccharides (POLY-IRON 150) 150 MG capsule Take 150 mg by mouth daily.  . Melatonin 5 MG TABS Take 5 mg by mouth at bedtime.  . Multiple Vitamin (MULTIVITAMIN WITH MINERALS) TABS tablet Take 1 tablet by mouth daily. Centrum Silver  . potassium chloride SA (K-DUR,KLOR-CON) 20 MEQ tablet TAKE 1 TABLET ONCE DAILY.  . ranitidine (ZANTAC) 150 MG tablet Take 150 mg by mouth daily as needed for heartburn.   . rosuvastatin (CRESTOR) 40 MG tablet Take 1 tablet (40 mg  total) by mouth daily.                   Objective:   Physical Exam    amb robust wm nad    08/08/2017      214   02/03/15 197 lb (89.359 kg)  01/12/15 200 lb 8 oz (90.946 kg)  12/28/14 200 lb (90.719 kg)    Vital signs reviewed  - Note on arrival 02 sats   95% on RA   HEENT: nl dentition, turbinates bilaterally, and oropharynx. Nl external ear canals without cough reflex   NECK :  without JVD/Nodes/TM/ nl carotid upstrokes bilaterally   LUNGS: no acc muscle use,  Nl contour chest with decreased bs at R base    CV:  RRR  no s3 or murmur or increase in P2, and  Trace bilateral pedal  edema   ABD:  soft and nontender with nl inspiratory excursion in the supine position. No bruits or organomegaly appreciated, bowel sounds nl  MS:  Nl gait/ ext warm without deformities, calf tenderness, cyanosis or clubbing No obvious joint restrictions   SKIN: warm and dry without lesions    NEURO:  alert, approp, nl sensorium with  no motor or cerebellar deficits apparent.          I personally reviewed images and agree with radiology impression as follows:  Ct chest 04/19/17  1.Nodularity along the peripheral right fissure measures 9 x 12 mm. While this may represent fluid tracking within the fissure, a 3 month follow up study is recommended given history of malignancy.  2.Multiple stable small pulmonary nodules seen bilaterally, the largest of which measures 3 mm. 3.Stable postsurgical changes due to prior right lower lobectomy including small right-sided pleural effusion.     Labs ordered/ reviewed:      Chemistry      Component Value Date/Time   NA 138 08/05/2017 1238   NA 142 03/07/2016 1213   K 4.6 08/05/2017 1238   K 4.5 03/07/2016 1213   CL 106 08/05/2017 1238   CO2 25 08/05/2017 1238   CO2 25 03/07/2016 1213   BUN 11 08/05/2017 1238   BUN 12.5 03/07/2016 1213   CREATININE 1.34 (H) 08/05/2017 1238   CREATININE 1.62 (H) 09/20/2016 1141   CREATININE  1.2 03/07/2016 1213      Component Value Date/Time   CALCIUM 9.2 08/05/2017 1238   CALCIUM 9.8 03/07/2016 1213   ALKPHOS 73 08/05/2017 1238   ALKPHOS 75 03/07/2016 1213   AST 23 08/05/2017 1238   AST 18 03/07/2016 1213   ALT 21 08/05/2017 1238   ALT 23 03/07/2016 1213   BILITOT 0.5 08/05/2017 1238   BILITOT 0.50 03/07/2016 1213        Lab Results  Component Value Date   WBC 5.6 08/05/2017   HGB 12.4 (L) 08/05/2017  HCT 38.2 (L) 08/05/2017   MCV 90.3 08/05/2017   PLT 243 08/05/2017          Lab Results  Component Value Date   TSH 1.519 08/05/2017         Lab Results  Component Value Date   ESRSEDRATE 60 (H) 08/05/2017   ESRSEDRATE 12 03/07/2016        Assessment:

## 2017-08-08 NOTE — Patient Instructions (Signed)
Complete the cardiac work up then return here to regroup if breathing not back to your level of satisfaction   Keep appt to do the pfts and I will place a reminder in file for review and call you if any change in medications indicated

## 2017-08-09 ENCOUNTER — Other Ambulatory Visit (HOSPITAL_COMMUNITY): Payer: Self-pay | Admitting: Respiratory Therapy

## 2017-08-10 ENCOUNTER — Encounter: Payer: Self-pay | Admitting: Internal Medicine

## 2017-08-10 NOTE — Assessment & Plan Note (Signed)
  01/24/11 RLLobectomy [facility] Sq Cell 1B  Evaluated by Dr Darlyne Russian radiation or chemotherapy   Most recent ct reviewed with pt from 04/19/17 and assured this did not show amio toxicity or convincing evidence of recurrent ca but the nodules need f/u and the symptoms of sob have worsened since this study so f/u with HRCT needed  Discussed in detail all the  indications, usual  risks and alternatives  relative to the benefits with patient who agrees to proceed with w/u as outlined.

## 2017-08-10 NOTE — Assessment & Plan Note (Signed)
Quit smoking 07/2013 - PFT's  1/14//2018  FEV1 2.10 (62 % ) ratio 64  p 7 % improvement from saba p nothing prior to study with DLCO  40/40c % corrects to 59  % for alv volume  And typical curvature  - 08/08/2017  Walked RA x 3 laps @ 185 ft each stopped due to  End of study, nl pace, no  Desat/ min sob with legs stopping him from going further        When respiratory symptoms begin or become refractory well after a patient reports complete smoking cessation,  Especially when this wasn't the case while they were smoking, a red flag is raised based on the work of Dr Kris Mouton which states:  if you quit smoking when your best day FEV1 is still well preserved it is highly unlikely you will progress to severe disease.  That is to say, once the smoking stops,  the symptoms should not suddenly erupt or markedly worsen.  If so, the differential diagnosis should include  obesity/deconditioning,  LPR/Reflux/Aspiration syndromes,  occult CHF, or  especially side effect of medications commonly used in this population - concerned about Angiotensin here with elevated esr with f/u pfts / hrct already ordered by Dr Aundra Dubin   For now will hold off empirical trials of bronchodilators esp since showed no reversibility with saba and based on the [facility] principle outlined above  Will review cards w/u w/a and f/u accordingly

## 2017-08-13 ENCOUNTER — Encounter (HOSPITAL_COMMUNITY): Payer: Self-pay

## 2017-08-13 ENCOUNTER — Ambulatory Visit (HOSPITAL_COMMUNITY)
Admission: RE | Admit: 2017-08-13 | Discharge: 2017-08-13 | Disposition: A | Discharge status: Home / Self Care | Payer: Commercial Managed Care - PPO | Source: Ambulatory Visit | Attending: Cardiology | Admitting: Cardiology

## 2017-08-13 ENCOUNTER — Encounter (HOSPITAL_COMMUNITY): Payer: Commercial Managed Care - PPO

## 2017-08-13 DIAGNOSIS — J984 Other disorders of lung: Secondary | ICD-10-CM | POA: Diagnosis not present

## 2017-08-13 DIAGNOSIS — Z87891 Personal history of nicotine dependence: Secondary | ICD-10-CM | POA: Diagnosis not present

## 2017-08-13 DIAGNOSIS — K8020 Calculus of gallbladder without cholecystitis without obstruction: Secondary | ICD-10-CM | POA: Diagnosis not present

## 2017-08-13 DIAGNOSIS — I5032 Chronic diastolic (congestive) heart failure: Secondary | ICD-10-CM | POA: Insufficient documentation

## 2017-08-13 DIAGNOSIS — I2510 Atherosclerotic heart disease of native coronary artery without angina pectoris: Secondary | ICD-10-CM | POA: Insufficient documentation

## 2017-08-13 DIAGNOSIS — J441 Chronic obstructive pulmonary disease with (acute) exacerbation: Secondary | ICD-10-CM | POA: Insufficient documentation

## 2017-08-13 DIAGNOSIS — J439 Emphysema, unspecified: Secondary | ICD-10-CM

## 2017-08-13 DIAGNOSIS — I700 Atherosclerosis of aorta: Secondary | ICD-10-CM | POA: Diagnosis not present

## 2017-08-13 LAB — PULMONARY FUNCTION TEST
DL/VA % pred: 56 %
DL/VA: 2.68 ml/min/mmHg/L
DLCO UNC: 12.4 ml/min/mmHg
DLCO unc % pred: 34 %
FEF 25-75 Pre: 0.97 L/sec
FEF2575-%Pred-Pre: 41 %
FEV1-%Pred-Pre: 59 %
FEV1-Pre: 1.98 L
FEV1FVC-%PRED-PRE: 88 %
FEV6-%PRED-PRE: 70 %
FEV6-PRE: 3.03 L
FEV6FVC-%Pred-Pre: 104 %
FVC-%Pred-Pre: 67 %
FVC-PRE: 3.1 L
PRE FEV1/FVC RATIO: 64 %
PRE FEV6/FVC RATIO: 98 %
RV % PRED: 156 %
RV: 4.26 L
TLC % PRED: 97 %
TLC: 7.39 L

## 2017-08-15 ENCOUNTER — Telehealth (HOSPITAL_COMMUNITY): Payer: Self-pay

## 2017-08-15 NOTE — Telephone Encounter (Signed)
Notes recorded by Shirley Muscat, RN on 08/15/2017 at 11:21 AM EST Pt aware of results  ------  Notes recorded by Larey Dresser, MD on 08/14/2017 at 9:56 PM EST Emphysema, no evidence for interstitial lung disease. No clear sign of amiodarone lung toxicity

## 2017-08-19 ENCOUNTER — Other Ambulatory Visit (HOSPITAL_COMMUNITY): Payer: Self-pay | Admitting: Cardiology

## 2017-08-20 ENCOUNTER — Ambulatory Visit (HOSPITAL_COMMUNITY)
Admission: RE | Admit: 2017-08-20 | Discharge: 2017-08-20 | Disposition: A | Discharge status: Home / Self Care | Payer: Commercial Managed Care - PPO | Source: Ambulatory Visit | Attending: Cardiology | Admitting: Cardiology

## 2017-08-20 ENCOUNTER — Encounter (HOSPITAL_COMMUNITY): Payer: Self-pay | Admitting: Cardiology

## 2017-08-20 ENCOUNTER — Encounter (HOSPITAL_COMMUNITY)
Admission: RE | Disposition: A | Discharge status: Home / Self Care | Payer: Self-pay | Source: Ambulatory Visit | Attending: Cardiology

## 2017-08-20 DIAGNOSIS — I480 Paroxysmal atrial fibrillation: Secondary | ICD-10-CM | POA: Diagnosis not present

## 2017-08-20 DIAGNOSIS — Z79899 Other long term (current) drug therapy: Secondary | ICD-10-CM | POA: Insufficient documentation

## 2017-08-20 DIAGNOSIS — R079 Chest pain, unspecified: Secondary | ICD-10-CM | POA: Diagnosis not present

## 2017-08-20 DIAGNOSIS — E785 Hyperlipidemia, unspecified: Secondary | ICD-10-CM | POA: Diagnosis not present

## 2017-08-20 DIAGNOSIS — N189 Chronic kidney disease, unspecified: Secondary | ICD-10-CM | POA: Insufficient documentation

## 2017-08-20 DIAGNOSIS — Z7901 Long term (current) use of anticoagulants: Secondary | ICD-10-CM | POA: Diagnosis not present

## 2017-08-20 DIAGNOSIS — I5032 Chronic diastolic (congestive) heart failure: Secondary | ICD-10-CM | POA: Diagnosis not present

## 2017-08-20 DIAGNOSIS — H409 Unspecified glaucoma: Secondary | ICD-10-CM | POA: Insufficient documentation

## 2017-08-20 DIAGNOSIS — Z85118 Personal history of other malignant neoplasm of bronchus and lung: Secondary | ICD-10-CM | POA: Diagnosis not present

## 2017-08-20 DIAGNOSIS — I5022 Chronic systolic (congestive) heart failure: Secondary | ICD-10-CM

## 2017-08-20 DIAGNOSIS — G4733 Obstructive sleep apnea (adult) (pediatric): Secondary | ICD-10-CM | POA: Diagnosis not present

## 2017-08-20 DIAGNOSIS — I739 Peripheral vascular disease, unspecified: Secondary | ICD-10-CM | POA: Insufficient documentation

## 2017-08-20 DIAGNOSIS — R0600 Dyspnea, unspecified: Secondary | ICD-10-CM | POA: Diagnosis present

## 2017-08-20 DIAGNOSIS — I130 Hypertensive heart and chronic kidney disease with heart failure and stage 1 through stage 4 chronic kidney disease, or unspecified chronic kidney disease: Secondary | ICD-10-CM | POA: Insufficient documentation

## 2017-08-20 DIAGNOSIS — Z87891 Personal history of nicotine dependence: Secondary | ICD-10-CM | POA: Diagnosis not present

## 2017-08-20 DIAGNOSIS — J449 Chronic obstructive pulmonary disease, unspecified: Secondary | ICD-10-CM | POA: Diagnosis not present

## 2017-08-20 HISTORY — PX: LEFT HEART CATH AND CORONARY ANGIOGRAPHY: CATH118249

## 2017-08-20 SURGERY — LEFT HEART CATH AND CORONARY ANGIOGRAPHY
Anesthesia: LOCAL

## 2017-08-20 MED ORDER — ACETAMINOPHEN 325 MG PO TABS: 650.0000 mg | ORAL_TABLET | ORAL | Status: DC | PRN

## 2017-08-20 MED ORDER — FENTANYL CITRATE (PF) 100 MCG/2ML IJ SOLN
INTRAMUSCULAR | Status: DC | PRN
  Administered 2017-08-20 (×2): 25 ug via INTRAVENOUS

## 2017-08-20 MED ORDER — SODIUM CHLORIDE 0.9 % IV SOLN: 250.0000 mL | INTRAVENOUS | Status: DC | PRN

## 2017-08-20 MED ORDER — HEPARIN (PORCINE) IN NACL 2-0.9 UNIT/ML-% IJ SOLN
INTRAMUSCULAR | Status: AC | PRN
  Administered 2017-08-20: 1000 mL

## 2017-08-20 MED ORDER — ASPIRIN 81 MG PO CHEW: 81.0000 mg | CHEWABLE_TABLET | ORAL | Status: DC

## 2017-08-20 MED ORDER — FENTANYL CITRATE (PF) 100 MCG/2ML IJ SOLN
INTRAMUSCULAR | Status: AC
  Filled 2017-08-20: qty 2

## 2017-08-20 MED ORDER — IOPAMIDOL (ISOVUE-370) INJECTION 76%
INTRAVENOUS | Status: AC
  Filled 2017-08-20: qty 100

## 2017-08-20 MED ORDER — SODIUM CHLORIDE 0.9 % IV SOLN
INTRAVENOUS | Status: DC
  Administered 2017-08-20: 06:00:00 via INTRAVENOUS

## 2017-08-20 MED ORDER — SODIUM CHLORIDE 0.9% FLUSH: 3.0000 mL | INTRAVENOUS | Status: DC | PRN

## 2017-08-20 MED ORDER — HEPARIN SODIUM (PORCINE) 1000 UNIT/ML IJ SOLN
INTRAMUSCULAR | Status: DC | PRN
  Administered 2017-08-20: 5000 [IU] via INTRAVENOUS

## 2017-08-20 MED ORDER — VERAPAMIL HCL 2.5 MG/ML IV SOLN
INTRAVENOUS | Status: AC
  Filled 2017-08-20: qty 2

## 2017-08-20 MED ORDER — SODIUM CHLORIDE 0.9 % WEIGHT BASED INFUSION: 1.0000 mL/kg/h | INTRAVENOUS | Status: AC

## 2017-08-20 MED ORDER — SODIUM CHLORIDE 0.9% FLUSH: 3.0000 mL | Freq: Two times a day (BID) | INTRAVENOUS | Status: DC

## 2017-08-20 MED ORDER — LIDOCAINE HCL (PF) 1 % IJ SOLN
INTRAMUSCULAR | Status: AC
  Filled 2017-08-20: qty 30

## 2017-08-20 MED ORDER — VERAPAMIL HCL 2.5 MG/ML IV SOLN
INTRAVENOUS | Status: DC | PRN
  Administered 2017-08-20: 10 mL via INTRA_ARTERIAL

## 2017-08-20 MED ORDER — LIDOCAINE HCL (PF) 1 % IJ SOLN
INTRAMUSCULAR | Status: DC | PRN
  Administered 2017-08-20: 2 mL

## 2017-08-20 MED ORDER — ONDANSETRON HCL 4 MG/2ML IJ SOLN: 4.0000 mg | Freq: Four times a day (QID) | INTRAMUSCULAR | Status: DC | PRN

## 2017-08-20 MED ORDER — MIDAZOLAM HCL 2 MG/2ML IJ SOLN
INTRAMUSCULAR | Status: DC | PRN
  Administered 2017-08-20 (×2): 1 mg via INTRAVENOUS

## 2017-08-20 MED ORDER — MIDAZOLAM HCL 2 MG/2ML IJ SOLN
INTRAMUSCULAR | Status: AC
  Filled 2017-08-20: qty 2

## 2017-08-20 MED ORDER — HEPARIN (PORCINE) IN NACL 2-0.9 UNIT/ML-% IJ SOLN
INTRAMUSCULAR | Status: AC
  Filled 2017-08-20: qty 1000

## 2017-08-20 MED ORDER — HEPARIN SODIUM (PORCINE) 1000 UNIT/ML IJ SOLN
INTRAMUSCULAR | Status: AC
  Filled 2017-08-20: qty 1

## 2017-08-20 MED ORDER — IOPAMIDOL (ISOVUE-370) INJECTION 76%
INTRAVENOUS | Status: DC | PRN
  Administered 2017-08-20: 70 mL via INTRA_ARTERIAL

## 2017-08-20 SURGICAL SUPPLY — 11 items
CATH 5FR JL3.5 JR4 ANG PIG MP (CATHETERS) ×1 IMPLANT
CATH INFINITI 5 FR 3DRC (CATHETERS) ×1 IMPLANT
CATH INFINITI 5 FR JL3.5 (CATHETERS) ×1 IMPLANT
DEVICE RAD COMP TR BAND LRG (VASCULAR PRODUCTS) ×1 IMPLANT
GLIDESHEATH SLEND SS 6F .021 (SHEATH) ×1 IMPLANT
GUIDEWIRE INQWIRE 1.5J.035X260 (WIRE) IMPLANT
INQWIRE 1.5J .035X260CM (WIRE) ×2
KIT HEART LEFT (KITS) ×2 IMPLANT
PACK CARDIAC CATHETERIZATION (CUSTOM PROCEDURE TRAY) ×2 IMPLANT
TRANSDUCER W/STOPCOCK (MISCELLANEOUS) ×2 IMPLANT
TUBING CIL FLEX 10 FLL-RA (TUBING) ×2 IMPLANT

## 2017-08-20 NOTE — Discharge Instructions (Signed)
**Note -identified via Obfuscation** Radial Site Care °Refer to this sheet in the next few weeks. These instructions provide you with information about caring for yourself after your procedure. Your health care provider may also give you more specific instructions. Your treatment has been planned according to current medical practices, but problems sometimes occur. Call your health care provider if you have any problems or questions after your procedure. °What can I expect after the procedure? °After your procedure, it is typical to have the following: °· Bruising at the radial site that usually fades within 1-2 weeks. °· Blood collecting in the tissue (hematoma) that may be painful to the touch. It should usually decrease in size and tenderness within 1-2 weeks. ° °Follow these instructions at home: °· Take medicines only as directed by your health care provider. °· You may shower 24-48 hours after the procedure or as directed by your health care provider. Remove the bandage (dressing) and gently wash the site with plain soap and water. Pat the area dry with a clean towel. Do not rub the site, because this may cause bleeding. °· Do not take baths, swim, or use a hot tub until your health care provider approves. °· Check your insertion site every day for redness, swelling, or drainage. °· Do not apply powder or lotion to the site. °· Do not flex or bend the affected arm for 24 hours or as directed by your health care provider. °· Do not push or pull heavy objects with the affected arm for 24 hours or as directed by your health care provider. °· Do not lift over 10 lb (4.5 kg) for 5 days after your procedure or as directed by your health care provider. °· Ask your health care provider when it is okay to: °? Return to work or school. °? Resume usual physical activities or sports. °? Resume sexual activity. °· Do not drive home if you are discharged the same day as the procedure. Have someone else drive you. °· You may drive 24 hours after the procedure  unless otherwise instructed by your health care provider. °· Do not operate machinery or power tools for 24 hours after the procedure. °· If your procedure was done as an outpatient procedure, which means that you went home the same day as your procedure, a responsible adult should be with you for the first 24 hours after you arrive home. °· Keep all follow-up visits as directed by your health care provider. This is important. °Contact a health care provider if: °· You have a fever. °· You have chills. °· You have increased bleeding from the radial site. Hold pressure on the site. °Get help right away if: °· You have unusual pain at the radial site. °· You have redness, warmth, or swelling at the radial site. °· You have drainage (other than a small amount of blood on the dressing) from the radial site. °· The radial site is bleeding, and the bleeding does not stop after 30 minutes of holding steady pressure on the site. °· Your arm or hand becomes pale, cool, tingly, or numb. °This information is not intended to replace advice given to you by your health care provider. Make sure you discuss any questions you have with your health care provider. °Document Released: 10/20/2010 Document Revised: 02/23/2016 Document Reviewed: 04/05/2014 °Elsevier Interactive Patient Education © 2018 Elsevier Inc. ° °

## 2017-08-20 NOTE — Interval H&P Note (Signed)
History and Physical Interval Note:  08/20/2017 7:26 AM  Troy Kenner [name]  has presented today for surgery, with the diagnosis of shortness of breath  The various methods of treatment have been discussed with the patient and family. After consideration of risks, benefits and other options for treatment, the patient has consented to  Procedure(s): LEFT HEART CATH AND CORONARY ANGIOGRAPHY (N/A) as a surgical intervention .  The patient's history has been reviewed, patient examined, no change in status, stable for surgery.  I have reviewed the patient's chart and labs.  Questions were answered to the patient's satisfaction.     Troy [name] [employer]

## 2017-08-21 ENCOUNTER — Telehealth: Payer: Self-pay | Admitting: *Deleted

## 2017-08-21 NOTE — Telephone Encounter (Signed)
Ov with MW on 09/02/17

## 2017-08-21 NOTE — Telephone Encounter (Signed)
-----   Message from Tanda Rockers, MD sent at 08/21/2017  5:38 AM EST ----- Needs ov per Dr Aundra Dubin to review studies and options for rx - in next 2 weeks is fine

## 2017-09-02 ENCOUNTER — Ambulatory Visit (INDEPENDENT_AMBULATORY_CARE_PROVIDER_SITE_OTHER): Payer: Commercial Managed Care - PPO | Admitting: Internal Medicine

## 2017-09-02 ENCOUNTER — Encounter: Payer: Self-pay | Admitting: Internal Medicine

## 2017-09-02 ENCOUNTER — Ambulatory Visit (INDEPENDENT_AMBULATORY_CARE_PROVIDER_SITE_OTHER): Payer: Commercial Managed Care - PPO | Admitting: *Deleted

## 2017-09-02 VITALS — BP 132/60 | HR 68 | Ht 73.0 in | Wt 209.0 lb

## 2017-09-02 DIAGNOSIS — I480 Paroxysmal atrial fibrillation: Secondary | ICD-10-CM

## 2017-09-02 DIAGNOSIS — R0609 Other forms of dyspnea: Secondary | ICD-10-CM

## 2017-09-02 DIAGNOSIS — J449 Chronic obstructive pulmonary disease, unspecified: Secondary | ICD-10-CM | POA: Diagnosis not present

## 2017-09-02 NOTE — Patient Instructions (Addendum)
Keep walking with goal of 30 minutes daily at a pace where you are short of breath but never out of breath  You need to buy a pulse oximeter and monitor your oxygen level when you walk and call me if you notice a downward trend in your 02 saturations or exercise toleranc (goal is keep it above 90%)   We will be referring you for pulmonary rehab  Please schedule a follow up visit in 3 months but call sooner if needed

## 2017-09-02 NOTE — Progress Notes (Signed)
Carelink Summary Report / Loop Recorder 

## 2017-09-02 NOTE — Progress Notes (Signed)
Subjective:     Patient ID: Troy Kenner [name], male   DOB: January 09, 1941     MRN: [id number]   Brief patient profile:  62 yowm quit smoking Oct 2014 s/p RLLobecty 2012 at [facility] with Ib Sq cell ca and no endobronchial dz at that time and with no RT / chemo being followed by [facility] / [facility] with last ct chest done in 03/2014 showing persistent moderate R effusion  due for f/u 04/2015 with new onset gen persistent   fatigue winter of 2015 then very abruptly ill 01/08/15 sore throat, cough congestion yellow mucus, light headed rx doxy / pred and referrred to pulmonary clninic 02/03/2015 by Dr Linna Darner > dx was pna> cleared  Out then f/u ct at [facility] 04/19/17 showed ? Nodules in RLL and referred himself to pulmonary clinic 08/08/2017       History of Present Illness  08/08/2017  ov/Amorina Doerr re:  Consultation req by Dr Aundra Dubin  Chief Complaint  Patient presents with  . Follow-up    Pt is having SOB with exertion for last 6 months becoming bothersome. Pt is wheezing and has dry cough.   doe = sob getting dressed x 4-5 months  Sleeps ok Legs R > L claudication stops him before his breathing does  On amiodarone  X  03/05/17 but only 100 mg since 03/28/17  rec Complete the cardiac work up then return here to regroup if breathing not back to your level of satisfaction      08/20/17  Nl LHC/ lvedp =13 > referred back to pulmonary   09/02/2017  f/u ov/Jacquelene Kopecky re:   GOLD II copd/ on amiodarone and no pulmonary meds  Chief Complaint  Patient presents with  . Follow-up    Pt states Dr Aundra Dubin rec that he come back for f/u. He states his breathing is unchanged since the last visit here.   Walking regularly around his neighborhood including some hills now but really doesn't know how to pace himself and has not been observing his 02  Saturations and shows interest in rehabilitation.  No obvious day to day or daytime variability or assoc excess/ purulent sputum or mucus plugs or hemoptysis or cp or chest tightness, subjective  wheeze or overt sinus or hb symptoms. No unusual exposure hx or h/o childhood pna/ asthma or knowledge of premature birth.  Sleeping ok flat without nocturnal  or early am exacerbation  of respiratory  c/o's or need for noct saba. Also denies any obvious fluctuation of symptoms with weather or environmental changes or other aggravating or alleviating factors except as outlined above   Current Allergies, Complete Past Medical History, Past Surgical History, Family History, and Social History were reviewed in [employer] record.  ROS  The following are not active complaints unless bolded Hoarseness, sore throat, dysphagia, dental problems, itching, sneezing,  nasal congestion or discharge of excess mucus or purulent secretions, ear ache,   fever, chills, sweats, unintended wt loss or wt gain, classically pleuritic or exertional cp,  orthopnea pnd or leg swelling, presyncope, palpitations, abdominal pain, anorexia, nausea, vomiting, diarrhea  or change in bowel habits or change in bladder habits, change in stools or change in urine, dysuria, hematuria,  rash, arthralgias, visual complaints, headache, numbness, weakness or ataxia or problems with walking or coordination,  change in mood/affect or memory.        Current Meds  Medication Sig  . acetaminophen (TYLENOL) 325 MG tablet Take 650 mg every 6 (six) hours as needed  by mouth for moderate pain or headache.  [name] amiodarone (PACERONE) 100 MG tablet Take 1 tablet (100 mg total) by mouth daily.  . bimatoprost (LUMIGAN) 0.01 % SOLN Place 1 drop at bedtime into both eyes.   . Biotin 10000 MCG TABS Take 10,000 mcg daily by mouth.   [name] CARDURA 4 MG tablet TAKE (1/2) TABLET DAILY. (Patient taking differently: Take 2 mg by mouth daily)  . cilostazol (PLETAL) 100 MG tablet Take 100 mg 2 (two) times daily by mouth.  . diltiazem (CARDIZEM CD) 120 MG 24 hr capsule TAKE 1 CAPSULE DAILY. (Patient taking differently: Take 120 mg by mouth daily)   . dorzolamide-timolol (COSOPT) 22.3-6.8 MG/ML ophthalmic solution Place 2 drops 2 (two) times daily into the right eye.   [name] ELIQUIS 5 MG TABS tablet TAKE 1 TABLET BY MOUTH TWICE DAILY.  [name] FLUoxetine (PROZAC) 20 MG capsule TAKE (1) CAPSULE DAILY. (Patient taking differently: Take 20 mg by mouth daily)  . furosemide (LASIX) 40 MG tablet Take 0.5-1 tablets (20-40 mg total) by mouth as directed. Take 40 mg daily alternating with 20 mg daily (Patient taking differently: Take 20-40 mg See admin instructions by mouth. Take 40 mg daily alternating with 20 mg daily)  . iron polysaccharides (POLY-IRON 150) 150 MG capsule Take 150 mg by mouth daily.  . Melatonin 5 MG TABS Take 5 mg by mouth at bedtime.  . Multiple Vitamin (MULTIVITAMIN WITH MINERALS) TABS tablet Take 1 tablet by mouth daily. Centrum Silver  . Polyethyl Glycol-Propyl Glycol (SYSTANE OP) Place 1-2 drops as needed into both eyes (for dry eyes).  . potassium chloride SA (K-DUR,KLOR-CON) 20 MEQ tablet TAKE 1 TABLET ONCE DAILY. (Patient taking differently: Take 20 MEQ by mouth once daily)  . ranitidine (ZANTAC) 150 MG tablet Take 150 mg by mouth daily as needed for heartburn.   . rosuvastatin (CRESTOR) 40 MG tablet Take 1 tablet (40 mg total) by mouth daily.            Objective:   Physical Exam  amb wm nad    09/02/2017       209   08/08/2017      214   02/03/15 197 lb (89.359 kg)  01/12/15 200 lb 8 oz (90.946 kg)  12/28/14 200 lb (90.719 kg)     Vital signs reviewed - Note on arrival 02 sats  95% on RA      LUNGS: no acc muscle use,  Nl contour chest with decreased bs at R base    CV:  RRR  no s3 or murmur or increase in P2, and  Trace bilateral pedal  edema     HEENT: nl dentition, turbinates bilaterally, and oropharynx. Nl external ear canals without cough reflex   NECK :  without JVD/Nodes/TM/ nl carotid upstrokes bilaterally   LUNGS: no acc muscle use,  Nl contour with decreased bs at R base    CV:  RRR  no s3 or  murmur or increase in P2, and no edema   ABD:  soft and nontender with nl inspiratory excursion in the supine position. No bruits or organomegaly appreciated, bowel sounds nl  MS:  Nl gait/ ext warm without deformities, calf tenderness, cyanosis or clubbing No obvious joint restrictions   SKIN: warm and dry without lesions    NEURO:  alert, approp, nl sensorium with  no motor or cerebellar deficits apparent.         I personally reviewed images and agree with radiology impression as  follows:   Chest CT HRCT  08/13/17 1. No evidence of interstitial lung disease. 2. 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

## 2017-09-03 ENCOUNTER — Telehealth (HOSPITAL_COMMUNITY): Payer: Self-pay

## 2017-09-03 NOTE — Telephone Encounter (Signed)
Called and spoke with patient in regards to insurance - Patient is insured with [employer]. Patient stated he would like a brochure before committing to program. Mailed brochure.

## 2017-09-04 ENCOUNTER — Encounter: Payer: Self-pay | Admitting: Internal Medicine

## 2017-09-04 NOTE — Assessment & Plan Note (Signed)
-    02/03/2015  Walked RA x 3 laps @ 185 ft each stopped due to min sob brisk pace, L hip pain, no desat  -  LHC  08/05/17  Nl CA, nl pressures  -  HRCT  08/13/17  1. No evidence of interstitial lung disease. 2. 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  Referred to rehab 09/02/2017   Can f/u p rehab  Or in 3 months, whichever comes first.

## 2017-09-04 NOTE — Assessment & Plan Note (Signed)
Quit smoking 07/2013 - PFT's  1/14//2018  FEV1 2.10 (62 % ) ratio 64  p 7 % improvement from saba p nothing prior to study with DLCO  40/40c % corrects to 59  % for alv volume  And typical curvature  - 08/08/2017  Walked RA x 3 laps @ 185 ft each stopped due to  End of study, nl pace, no  Desat/ min sob with legs stopping him from going further     - PFT's  08/13/17   FEV1 1.98 (59 % ) ratio 64  p nothing prior to study with DLCO  34 % corrects to 56 % for alv volume  > refused trial of saba "afraid of glaucoma effects"  - Referred to pulmonary rehab 09/02/2017   Note that his ratio shows only mild obstruction as reflected in his flow volume curve with a restrictive component likely related to previous surgery and no evidence of interstitial lung disease on his high-resolution CT scan from November 2018.  He is not in interested in using bronchodilators if there is any risk to making his glaucoma worse. Since lama would be the best choice here and has the highest risk of causing glaucoma I believe is reasonable just to go with rehabilitation for now with the option of adding a LABA  Or LABA/ ICS combination perhaps later noting that he did not have significant response to bronchodilators and previous studies..   Rec: Pulmonary rehab/ monitor sats with walking due to ongoing amiodarone exposure but note that he is on low-dose and has no evidence of interstitial lung disease by his most recent HRCT   Discussed in detail all the  indications, usual  risks and alternatives  relative to the benefits with patient who agrees to proceed with rx as outlined  I had an extended discussion with the patient reviewing all relevant studies completed to date and  lasting 15 to 20 minutes of a 25 minute visit    Each maintenance medication was reviewed in detail including most importantly the difference between maintenance and prns and under what circumstances the prns are to be triggered using an action plan  format that is not reflected in the computer generated alphabetically organized AVS.    Please see AVS for specific instructions unique to this visit that I personally wrote and verbalized to the the pt in detail and then reviewed with pt  by my nurse highlighting any  changes in therapy recommended at today's visit to their plan of care.  [name]

## 2017-09-10 ENCOUNTER — Telehealth (HOSPITAL_COMMUNITY): Payer: Self-pay

## 2017-09-10 LAB — CUP PACEART REMOTE DEVICE CHECK
Date Time Interrogation Session: 20181201223935
MDC IDC PG IMPLANT DT: 20161111

## 2017-09-10 NOTE — Telephone Encounter (Signed)
Called to follow up with patient in regards to Brochure that I mailed - Patient has yet to receive it due to the weather. Patient will call once he receives it.

## 2017-09-11 ENCOUNTER — Other Ambulatory Visit: Payer: Self-pay | Admitting: Internal Medicine

## 2017-09-11 ENCOUNTER — Other Ambulatory Visit (HOSPITAL_COMMUNITY): Payer: Self-pay | Admitting: Cardiology

## 2017-09-13 ENCOUNTER — Other Ambulatory Visit (HOSPITAL_COMMUNITY): Payer: Self-pay | Admitting: Cardiology

## 2017-09-17 ENCOUNTER — Telehealth (HOSPITAL_COMMUNITY): Payer: Self-pay

## 2017-09-17 NOTE — Telephone Encounter (Signed)
Called and spoke with patient's wife in regards to Pulmonary Rehab - Wife stated patient will call tomorrow as they have a lot going on today.

## 2017-09-22 ENCOUNTER — Other Ambulatory Visit (HOSPITAL_COMMUNITY): Payer: Self-pay | Admitting: Cardiology

## 2017-09-30 ENCOUNTER — Other Ambulatory Visit (HOSPITAL_COMMUNITY): Payer: Self-pay | Admitting: Cardiology

## 2017-09-30 ENCOUNTER — Ambulatory Visit (INDEPENDENT_AMBULATORY_CARE_PROVIDER_SITE_OTHER): Payer: Commercial Managed Care - PPO | Admitting: *Deleted

## 2017-09-30 DIAGNOSIS — R0602 Shortness of breath: Secondary | ICD-10-CM

## 2017-09-30 DIAGNOSIS — I480 Paroxysmal atrial fibrillation: Secondary | ICD-10-CM

## 2017-09-30 DIAGNOSIS — I509 Heart failure, unspecified: Secondary | ICD-10-CM

## 2017-10-02 NOTE — Progress Notes (Signed)
Carelink Summary Report / Loop Recorder 

## 2017-10-03 ENCOUNTER — Encounter (HOSPITAL_COMMUNITY): Payer: Commercial Managed Care - PPO | Admitting: Cardiology

## 2017-10-09 ENCOUNTER — Encounter: Payer: Self-pay | Admitting: Vascular Surgery

## 2017-10-09 ENCOUNTER — Ambulatory Visit (INDEPENDENT_AMBULATORY_CARE_PROVIDER_SITE_OTHER)
Admission: RE | Admit: 2017-10-09 | Discharge: 2017-10-09 | Disposition: A | Discharge status: Home / Self Care | Payer: Commercial Managed Care - PPO | Source: Ambulatory Visit | Attending: Vascular Surgery | Admitting: Vascular Surgery

## 2017-10-09 ENCOUNTER — Ambulatory Visit (INDEPENDENT_AMBULATORY_CARE_PROVIDER_SITE_OTHER): Payer: Commercial Managed Care - PPO | Admitting: Vascular Surgery

## 2017-10-09 ENCOUNTER — Ambulatory Visit (HOSPITAL_COMMUNITY)
Admission: RE | Admit: 2017-10-09 | Discharge: 2017-10-09 | Disposition: A | Discharge status: Home / Self Care | Payer: Commercial Managed Care - PPO | Source: Ambulatory Visit | Attending: Vascular Surgery | Admitting: Vascular Surgery

## 2017-10-09 VITALS — BP 135/62 | HR 49 | Temp 96.9°F | Resp 20 | Ht 73.0 in | Wt 212.0 lb

## 2017-10-09 DIAGNOSIS — I6522 Occlusion and stenosis of left carotid artery: Secondary | ICD-10-CM | POA: Insufficient documentation

## 2017-10-09 DIAGNOSIS — I714 Abdominal aortic aneurysm, without rupture, unspecified: Secondary | ICD-10-CM

## 2017-10-09 DIAGNOSIS — Z87891 Personal history of nicotine dependence: Secondary | ICD-10-CM | POA: Diagnosis not present

## 2017-10-09 DIAGNOSIS — I739 Peripheral vascular disease, unspecified: Secondary | ICD-10-CM | POA: Insufficient documentation

## 2017-10-09 DIAGNOSIS — I6523 Occlusion and stenosis of bilateral carotid arteries: Secondary | ICD-10-CM

## 2017-10-09 DIAGNOSIS — Z95820 Peripheral vascular angioplasty status with implants and grafts: Secondary | ICD-10-CM | POA: Insufficient documentation

## 2017-10-09 DIAGNOSIS — E785 Hyperlipidemia, unspecified: Secondary | ICD-10-CM | POA: Diagnosis not present

## 2017-10-09 NOTE — Progress Notes (Signed)
Patient name: Troy Donath [name] MRN: [id number] DOB: [birth year] Sex: male  REASON FOR VISIT:   Follow-up of abdominal aortic aneurysm, carotid disease, and left femoropopliteal bypass.   HPI:   Troy Currie [name] is a pleasant 77 y.o. male who I have been following with multiple vascular issues.   He has a known small abdominal aortic aneurysm.  He denies any abdominal pain or back pain.  Back in 2016 this was 3.7 cm in maximum diameter.  He has a left femoropopliteal bypass graft which was placed in 1994 in [place] and then I revised it in 2001.  He also had angioplasty of this in 2016.  He does describe some bilateral calf claudication which is more significant on the right side.  He denies any rest pain or nonhealing ulcers.    In addition he had a right carotid endarterectomy in October 2014. He denies any history of stroke, TIAs, expressive or receptive aphasia, or amaurosis fugax.  He is not a smoker.  He does not take aspirin because he is on Eliquis.  He is on a statin.   Past Medical History:  Diagnosis Date  . AAA (abdominal aortic aneurysm) (HCC) LAST ABD. [name]  JAN 2014  3.4CM   MONITORED BY DR [name]  . Arthritis   . Atrial fibrillation ([place])   . Benign essential HTN 01/19/2016  . Bilateral carotid artery stenosis DUPLEX 12-29-2012  BY DR [facility]   BILATERAL ICA STENOSIS 60-79%  . BPH (benign prostatic hypertrophy)   . Cancer ([place]) 01-25-12   LUNG  . Complication of anesthesia    had nausea after carotid artery surgery, states the medicine given to help the nausea, made it worse  . COPD (chronic obstructive pulmonary disease) ([place])   . Exertional dyspnea   . Frequency of urination   . GERD (gastroesophageal reflux disease)   . Glaucoma BOTH EYES   Dr Gershon Crane  . History of basal cell carcinoma excision   . History of lung cancer APRIL 2012  SQUAMOUS CELL---- S/P RIGHT LOWER LOBECTOMY AT [facility] --  NO CHEMORADIATION---  NO RECURRENCE    ONCOLOGIST- DR Tressie Stalker  LOV [facility]  10-27-2012  . Hx of adenomatous colonic polyps 2005    X 2; 1 hyperplastic polyp; Dr Olevia Perches  . Hyperlipidemia   . Hypertension   . Impaired fasting glucose 2007   108; A1c5.4%  . Lesion of bladder   . Microhematuria   . Nocturia   . OSA (obstructive sleep apnea) 08/29/2015   Mild with AHI overall 6/hr but severe during REM sleep with AHI 30/hr with oxygen saturations < 88% for 5.5 minutes of total sleep time.    [name] PAD (peripheral artery disease) ([place]) ABI'S  JAN 2014  0.65 ON RIGHT ;  1.04 ON LEFT  . Peripheral vascular disease ([place]) S/P ANGIOPLASTY AND STENTING   FOLLOWED  BY DR [name]  . Spinal stenosis Sept. 2015  . Stroke [facility]) Jul 07, 2013   mini  TIA  . Urgency of urination     Family History  Problem Relation Age of Onset  . Stroke Mother        mini strokes  . Alcohol abuse Father   . Heart disease Father        MI after 32  . Stroke Father   . Hypertension Father   . Heart attack Father   . Heart disease Paternal Aunt        several  . Hypertension Paternal Aunt  several  . Stroke Paternal Aunt        several  . Stroke Paternal Uncle        several  . Heart disease Paternal Uncle        several;2 had MI pre 35    SOCIAL HISTORY: Social History   Tobacco Use  . Smoking status: Former Smoker    Packs/day: 0.50    Years: 50.00    Pack years: 25.00    Types: Cigarettes    Last attempt to quit: 07/29/2011    Years since quitting: 6.2  . Smokeless tobacco: Never Used  Substance Use Topics  . Alcohol use: Yes    Alcohol/week: 0.0 oz    Comment:  socially, variable    Allergies  Allergen Reactions  . Penicillins Hives, Shortness Of Breath and Other (See Comments)    Flushing  & dyspnea Because of a history of documented adverse serious drug reaction;Medi Alert bracelet  is recommended PCN reaction causing immediate rash, facial/tongue/throat swelling, SOB or lightheadedness with hypotension: Yes PCN reaction causing severe rash involving mucus  membranes or skin necrosis: No PCN reaction occurring within the last 10 years: NO PCN reaction that required hospitalization: NO  . Sulfonamide Derivatives Hives, Shortness Of Breath and Other (See Comments)    Flushing & dyspnea Because of a history of documented adverse serious drug reaction;Medi Alert bracelet  is recommended  . Niacin-Lovastatin Er Other (See Comments)    Dyspnea, flushing  . Atorvastatin Other (See Comments)    Myalgias & athralgias- takes Crestor    Current Outpatient Medications  Medication Sig Dispense Refill  . acetaminophen (TYLENOL) 325 MG tablet Take 650 mg every 6 (six) hours as needed by mouth for moderate pain or headache.    [name] amiodarone (PACERONE) 100 MG tablet Take 1 tablet (100 mg total) by mouth daily. 30 tablet 3  . bimatoprost (LUMIGAN) 0.01 % SOLN Place 1 drop at bedtime into both eyes.     . Biotin 10000 MCG TABS Take 10,000 mcg daily by mouth.     [name] CARDURA 4 MG tablet TAKE (1/2) TABLET DAILY. (Patient taking differently: Take 2 mg by mouth daily) 15 tablet 3  . diltiazem (CARDIZEM CD) 120 MG 24 hr capsule TAKE 1 CAPSULE DAILY. (Patient taking differently: Take 120 mg by mouth daily) 90 capsule 3  . dorzolamide-timolol (COSOPT) 22.3-6.8 MG/ML ophthalmic solution Place 2 drops 2 (two) times daily into the right eye.     [name] ELIQUIS 5 MG TABS tablet TAKE 1 TABLET BY MOUTH TWICE DAILY. 60 tablet 3  . FLUoxetine (PROZAC) 20 MG capsule TAKE (1) CAPSULE DAILY. 30 capsule 2  . furosemide (LASIX) 40 MG tablet TAKE 1/2 TABLET (20MG ) DAILY ALTERNATING WITH 1 TABLET (40MG ). 90 tablet 3  . iron polysaccharides (POLY-IRON 150) 150 MG capsule Take 150 mg by mouth daily.    . Melatonin 5 MG TABS Take 5 mg by mouth at bedtime.    . Multiple Vitamin (MULTIVITAMIN WITH MINERALS) TABS tablet Take 1 tablet by mouth daily. Centrum Silver    . Polyethyl Glycol-Propyl Glycol (SYSTANE OP) Place 1-2 drops as needed into both eyes (for dry eyes).    . potassium chloride SA  (K-DUR,KLOR-CON) 20 MEQ tablet TAKE 1 TABLET ONCE DAILY. (Patient taking differently: Take 20 MEQ by mouth once daily) 90 tablet 3  . ranitidine (ZANTAC) 150 MG tablet Take 150 mg by mouth daily as needed for heartburn.     . rosuvastatin (CRESTOR) 40 MG  tablet TAKE 1 TABLET ONCE DAILY. 30 tablet 6   No current facility-administered medications for this visit.     REVIEW OF SYSTEMS:  [X]  denotes positive finding, [ ]  denotes negative finding Cardiac  Comments:  Chest pain or chest pressure:    Shortness of breath upon exertion:    Short of breath when lying flat:    Irregular heart rhythm:        Vascular    Pain in calf, thigh, or hip brought on by ambulation:    Pain in feet at night that wakes you up from your sleep:     Blood clot in your veins:    Leg swelling:         Pulmonary    Oxygen at home:    Productive cough:     Wheezing:         Neurologic    Sudden weakness in arms or legs:     Sudden numbness in arms or legs:     Sudden onset of difficulty speaking or slurred speech:    Temporary loss of vision in one eye:     Problems with dizziness:         Gastrointestinal    Blood in stool:     Vomited blood:         Genitourinary    Burning when urinating:     Blood in urine:        Psychiatric    Major depression:         Hematologic    Bleeding problems:    Problems with blood clotting too easily:        Skin    Rashes or ulcers:        Constitutional    Fever or chills:     PHYSICAL EXAM:   Vitals:   10/09/17 1048 10/09/17 1051  BP: (!) 124/56 135/62  Pulse: (!) 49   Resp: 20   Temp: (!) 96.9 F (36.1 C)   TempSrc: Oral   SpO2: 95%   Weight: 212 lb (96.2 kg)   Height: 6\' 1"  (1.854 m)     GENERAL: The patient is a well-nourished male, in no acute distress. The vital signs are documented above. CARDIAC: There is a regular rate and rhythm.  VASCULAR: I do not detect carotid bruits. He has palpable femoral pulses.  I cannot palpate  popliteal or pedal pulses.  Both feet are warm and well-perfused. He has no significant lower extremity swelling. PULMONARY: There is good air exchange bilaterally without wheezing or rales. ABDOMEN: Soft and non-tender with normal pitched bowel sounds.  I cannot palpate his small abdominal aortic aneurysm. MUSCULOSKELETAL: There are no major deformities or cyanosis. NEUROLOGIC: No focal weakness or paresthesias are detected. SKIN: There are no ulcers or rashes noted. PSYCHIATRIC: The patient has a normal affect.  DATA:    ARTERIAL DOPPLER STUDY: I have independently interpreted his arterial Doppler study today.  On the right side he has a monophasic dorsalis pedis and posterior tibial signal with an ABI 54%.  This is stable compared to his study a year ago.  On the left side he has a triphasic posterior tibial signal and a biphasic dorsalis pedis signal.  ABIs 100% on the left.  ARTERIAL DUPLEX: I have independently interpreted the duplex of his bypass graft in the left leg.  The bypass graft is patent with biphasic Doppler signals throughout.  There were no areas of significant stenosis noted.  DUPLEX ABDOMINAL  AORTA: I have independently interpreted his duplex of his abdominal aortic aneurysm.  The maximum diameter of his aneurysm is 3.3 cm and this does not change compared to study in 2016.  The right common iliac artery measures 1.5 cm in maximum diameter.  The left common iliac artery measures 0.9 cm in maximum diameter.  CAROTID DUPLEX: I have independently interpreted his carotid duplex scan today.  His right carotid endarterectomy site is widely patent without evidence of restenosis.  He has no significant stenosis on the left.  Both vertebral arteries are patent with antegrade flow.  MEDICAL ISSUES:   STATUS POST LEFT FEMOROPOPLITEAL BYPASS: This patient underwent a left femoropopliteal bypass graft in [place] in 1994.  Most recently I revise this in November 2016.  He had a vein  patch angioplasty of a proximal stenosis in his bypass graft using left great saphenous vein.  His bypass graft is patent with a ABI of 100% and normal Doppler signals in the left foot.  He is not a smoker.  I have ordered a follow-up duplex scan of his graft and ABIs in 1 year.  STATUS POST RIGHT CAROTID ENDARTERECTOMY: He is status post right carotid endarterectomy in October 2014.  He is asymptomatic.  His right carotid endarterectomy site is widely patent.  He has no significant stenosis on the left.  I have ordered a follow-up duplex scan in 1 year and I will see him back at that time.  ABDOMINAL AORTIC ANEURYSM: This patient has a small 3.3 cm infrarenal abdominal aortic aneurysm.  This has been stable in size.  He understands we would not consider elective repair of the aneurysm unless it reached 5.5 cm in maximum diameter.  I ordered a follow-up duplex scan in 3 years and I will see him back at that time.  Fortunately he is not a smoker.  Deitra Mayo [facility] [phone]

## 2017-10-10 LAB — CUP PACEART REMOTE DEVICE CHECK
Implantable Pulse Generator Implant Date: 20161111
MDC IDC SESS DTM: 20181231223842

## 2017-10-16 ENCOUNTER — Other Ambulatory Visit (HOSPITAL_COMMUNITY): Payer: Self-pay | Admitting: Cardiology

## 2017-10-16 ENCOUNTER — Other Ambulatory Visit: Payer: Self-pay | Admitting: Internal Medicine

## 2017-10-25 ENCOUNTER — Encounter (HOSPITAL_COMMUNITY): Payer: Self-pay | Admitting: Cardiology

## 2017-10-25 ENCOUNTER — Ambulatory Visit (HOSPITAL_COMMUNITY)
Admission: RE | Admit: 2017-10-25 | Discharge: 2017-10-25 | Disposition: A | Discharge status: Home / Self Care | Payer: Commercial Managed Care - PPO | Source: Ambulatory Visit | Attending: Cardiology | Admitting: Cardiology

## 2017-10-25 VITALS — BP 137/59 | HR 49 | Wt 209.8 lb

## 2017-10-25 DIAGNOSIS — I5032 Chronic diastolic (congestive) heart failure: Secondary | ICD-10-CM | POA: Diagnosis not present

## 2017-10-25 DIAGNOSIS — G4733 Obstructive sleep apnea (adult) (pediatric): Secondary | ICD-10-CM | POA: Insufficient documentation

## 2017-10-25 DIAGNOSIS — R001 Bradycardia, unspecified: Secondary | ICD-10-CM | POA: Insufficient documentation

## 2017-10-25 DIAGNOSIS — H409 Unspecified glaucoma: Secondary | ICD-10-CM | POA: Diagnosis not present

## 2017-10-25 DIAGNOSIS — I480 Paroxysmal atrial fibrillation: Secondary | ICD-10-CM

## 2017-10-25 DIAGNOSIS — Z85118 Personal history of other malignant neoplasm of bronchus and lung: Secondary | ICD-10-CM | POA: Insufficient documentation

## 2017-10-25 DIAGNOSIS — I2510 Atherosclerotic heart disease of native coronary artery without angina pectoris: Secondary | ICD-10-CM | POA: Diagnosis not present

## 2017-10-25 DIAGNOSIS — Z7901 Long term (current) use of anticoagulants: Secondary | ICD-10-CM | POA: Diagnosis not present

## 2017-10-25 DIAGNOSIS — R0600 Dyspnea, unspecified: Secondary | ICD-10-CM | POA: Diagnosis not present

## 2017-10-25 DIAGNOSIS — Z79899 Other long term (current) drug therapy: Secondary | ICD-10-CM | POA: Insufficient documentation

## 2017-10-25 DIAGNOSIS — E785 Hyperlipidemia, unspecified: Secondary | ICD-10-CM | POA: Insufficient documentation

## 2017-10-25 DIAGNOSIS — I6529 Occlusion and stenosis of unspecified carotid artery: Secondary | ICD-10-CM | POA: Diagnosis not present

## 2017-10-25 DIAGNOSIS — Z87891 Personal history of nicotine dependence: Secondary | ICD-10-CM | POA: Insufficient documentation

## 2017-10-25 DIAGNOSIS — Z8673 Personal history of transient ischemic attack (TIA), and cerebral infarction without residual deficits: Secondary | ICD-10-CM | POA: Diagnosis not present

## 2017-10-25 DIAGNOSIS — J449 Chronic obstructive pulmonary disease, unspecified: Secondary | ICD-10-CM | POA: Diagnosis not present

## 2017-10-25 DIAGNOSIS — J439 Emphysema, unspecified: Secondary | ICD-10-CM | POA: Diagnosis not present

## 2017-10-25 DIAGNOSIS — I739 Peripheral vascular disease, unspecified: Secondary | ICD-10-CM | POA: Diagnosis not present

## 2017-10-25 DIAGNOSIS — I130 Hypertensive heart and chronic kidney disease with heart failure and stage 1 through stage 4 chronic kidney disease, or unspecified chronic kidney disease: Secondary | ICD-10-CM | POA: Insufficient documentation

## 2017-10-25 DIAGNOSIS — N189 Chronic kidney disease, unspecified: Secondary | ICD-10-CM | POA: Diagnosis not present

## 2017-10-25 DIAGNOSIS — I714 Abdominal aortic aneurysm, without rupture: Secondary | ICD-10-CM | POA: Diagnosis not present

## 2017-10-25 LAB — COMPREHENSIVE METABOLIC PANEL
ALBUMIN: 3.4 g/dL — AB (ref 3.5–5.0)
ALK PHOS: 63 U/L (ref 38–126)
ALT: 19 U/L (ref 17–63)
ANION GAP: 9 (ref 5–15)
AST: 20 U/L (ref 15–41)
BUN: 15 mg/dL (ref 6–20)
CALCIUM: 9.1 mg/dL (ref 8.9–10.3)
CO2: 25 mmol/L (ref 22–32)
Chloride: 105 mmol/L (ref 101–111)
Creatinine, Ser: 1.38 mg/dL — ABNORMAL HIGH (ref 0.61–1.24)
GFR calc Af Amer: 55 mL/min — ABNORMAL LOW (ref >60)
GFR calc non Af Amer: 48 mL/min — ABNORMAL LOW (ref >60)
GLUCOSE: 96 mg/dL (ref 65–99)
Potassium: 4.4 mmol/L (ref 3.5–5.1)
SODIUM: 139 mmol/L (ref 135–145)
Total Bilirubin: 0.8 mg/dL (ref 0.3–1.2)
Total Protein: 7.1 g/dL (ref 6.5–8.1)

## 2017-10-25 LAB — CBC
HCT: 37.7 % — ABNORMAL LOW (ref 39.0–52.0)
HEMOGLOBIN: 12.1 g/dL — AB (ref 13.0–17.0)
MCH: 29.2 pg (ref 26.0–34.0)
MCHC: 32.1 g/dL (ref 30.0–36.0)
MCV: 91.1 fL (ref 78.0–100.0)
Platelets: 229 10*3/uL (ref 150–400)
RBC: 4.14 MIL/uL — ABNORMAL LOW (ref 4.22–5.81)
RDW: 14.3 % (ref 11.5–15.5)
WBC: 5.9 10*3/uL (ref 4.0–10.5)

## 2017-10-25 LAB — LIPID PANEL
CHOLESTEROL: 105 mg/dL (ref 0–200)
HDL: 35 mg/dL — ABNORMAL LOW (ref >40)
LDL CALC: 50 mg/dL (ref 0–99)
Total CHOL/HDL Ratio: 3 RATIO
Triglycerides: 101 mg/dL (ref <150)
VLDL: 20 mg/dL (ref 0–40)

## 2017-10-25 NOTE — Patient Instructions (Signed)
Labs drawn today (if we do not call you, then your lab work was stable)   Your physician recommends that you schedule a follow-up appointment in: 4 months with [name]

## 2017-10-27 NOTE — Progress Notes (Signed)
Patient ID: Troy Kenner [name], male   DOB: 08-01-1941, 77 y.o.   MRN: [id number] PCP: Dr. Quay Burow Cardiology: [name]  77 y.o. with history of HTN, COPD, active smoking/COPD, carotid stenosis s/p right CEA, paroxysmal atrial fibrillation, and PAD returns for followup of CHF and atrial fibrillation.  He does not have known obstructive CAD but is at high risk for CAD based on his comorbidities.  Lexiscan Cardiolite in 3/14 showed no ischemia or infarction and echo in 3/14 showed normal EF.  He had a left fem-pop bypass at the [facility] in the '90s.  He is followed at [facility] for PAD. He has chronic right calf/thigh/buttocks claudication that is unchanged over the last few years and follows regularly at [facility].  He has had right lower lobectomy for lung cancer.  He continues to stay off cigarettes.    At a prior appointment, he was in atrial fibrillation but did not realize it.  I started him on Eliquis and diltiazem CD, and he spontaneously converted to NSR.  In 5/15, he was at [facility] and felt "strange" one day: fatigued, weak, short of breath.  He went to the ER and was in atrial fibrillation with HR in 80s-90s.  He spontaneously converted to NSR in the ER.  He felt back to normal after converting to NSR.  I started him on Multaq 400 mg bid.  With CHF, he was eventually transitioned over to amiodarone.   He had patch angioplasty revision of left fem-pop bypass in 11/16.  Now with minimal claudication.  He follows with [facility].     He had a Cardiolite and echo in 7/17 that were unremarkable.  Repeat Cardiolite in 12/17 showed no ischemia, echo was uninterpretable.  Cardiac MRI was therefore done in 1/18, showing EF 66% with normal-appearing RV, no late gadolinium enhancement.   Given increased exertional dyspnea and a defect on Cardiolite, he had LHC in 11/18.  This showed no obstructive CAD. He had a chest CT done given concern for possible amiodarone lung toxicity with increased dyspnea. This showed emphysema,  no ILD.    He is doing somewhat better. Able to walk on flat ground without stopping.  No chest pain.  Generalized fatigue. No orthopnea/PND.  Stable chronic right leg claudication, not significantly limiting.    ECG (personally reviewed): Sinus brady otherwise normal.   Labs (3/13): LDL 75, HDL 35, K 4.1, creatinine 1.1 Labs (3/14): LDL 91, HDL 27 Labs (10/14): K 4.3, creatinine 0.98 Labs (4/15): K 4.9, creatinine 1.1, LDL 76, HDL 30 Labs (5/15): K 4.3, creatinine 1.7, BNP 251 Labs (6/15): K 4.4, creatinine 1.3 Labs (10/15): TSH normal Labs (5/16): K 4.2, creatinine 1.12, HCT 39.5, LFTs normal, LDL 84, HDL 43, TSH normal Labs (11/16): creatinine 0.94 Labs (4/17): LDL 39, HDL 39 Labs (6/17): K 4.5, creatinine 1.2 Labs (9/17): K 4, creatinine 1.25, HCT 38.7 Labs (12/17): K 4.5, creatinine 1.49 => 1.62, BNP 43 Labs (2/18): K 3.8, creatinine 1.33, LFTs normal, TSH normal Labs (6/18): K 4.3, creatinine 1.49, LFTs normal, TSH normal, hgb 12.3 Labs (11/18): ESR 60, TSH normal, LFTs normal, creatinine 1.34  PMH: 1. HTN: Fatigue and cough with ramipril use.  2. COPD: Quit smoking 2014. PFTs (1/18) with moderately severe COPD.  - CT chest (11/18): Emphysema noted, no ILD.  3. AAA: CT 1/13 with 3.0 cm AAA.  Abdominal US (1/14) with 3.4 cm AAA. Abdominal US (4/15) with 3.25 x 3.27 AAA. Abdominal US (3/16) with 3.7 cm AAA.  Followed at [facility].  - Abd [name] (1/19): 3.3 cm AAA. 4. Squamous cell lung cancer diagnosed 2/12.  Had right lower lobectomy in 4/12.  5. Hyperlipidemia 6. PAD: Left fem-pop bypass 1994.  ABIs (2/12) 0.62 on right, 0.95 on left. ABIs (1/14): 0.65 on right, 1.04 on left. ABIs (4/15) 0.66 right, 0.87 left.  ABIs (3/16) 0.6 right, 0.88 left.  Patch angioplasty left fem-pop bypass in 11/16.  - Left fem-pop bypass patent on doppler evaluation in 12/17.  7. Stress myoview 2004 was normal. Lexiscan Cardiolite (3/14) with EF 66%, no ischemia or infarction. Lexiscan Cardiolite (7/17) with EF  61%, no ischemia/infarction.  - Cardiolite (12/17) with EF 62%, inferior/inferolateral fixed defect, most likely diaphragmatic attenuation, no ischemia.  - Cardiolite (9/18) with EF > 65%, fixed inferior defect (attenuation versus infarction), no ischemia.  - LHC (11/18): No obstructive CAD.  8. Chronic diastolic CHF: Echo (1/61): technically difficult with EF 55-60%, upper normal RV size.  Echo (5/16) with EF 60-65%.  Echo (7/17) with EF 55-60%, normal RV size and systolic function.  - Echo 12/17 with very poor windows, unable to comment on LV or RV function.  - Cardiac MRI (1/18) with EF 66%, normal RV size and systolic function.  9. Carotid stenosis: TIA 10/14.  Carotid dopplers with > 09% RICA, 60-45% LICA.  Patient had right CEA in 10/14. Carotids (4/15) patent right CEA, LICA 40-98% stenosis.  Carotids (11/91) < 47% LICA, patent right CEA.  - Carotid dopplers (12/17): right CEA ok, < 82% LICA stenosis.  - Carotid dopplers (1/19): Right CEA ok, 9-56% LICA stenosis.  10. Atrial fibrillation: Paroxysmal. First noted after lobectomy in 4/12 (brief), recurrence in 4/15 then in 5/15.  11. Spinal stenosis 12. OSA: Using CPAP.  24. Glaucoma  SH: Married, lives in [place].  Former [place].  Quit smoking in 2014.  Rare ETOH now.   FH: No premature CAD  ROS: All systems reviewed and negative except as per HPI.   Current Outpatient Medications  Medication Sig Dispense Refill  . acetaminophen (TYLENOL) 325 MG tablet Take 650 mg every 6 (six) hours as needed by mouth for moderate pain or headache.    [name] amiodarone (PACERONE) 200 MG tablet TAKE 1/2 TABLET EVERY DAY. 15 tablet 2  . bimatoprost (LUMIGAN) 0.01 % SOLN Place 1 drop at bedtime into both eyes.     . Biotin 10000 MCG TABS Take 10,000 mcg daily by mouth.     [name] CARDURA 4 MG tablet TAKE (1/2) TABLET DAILY. 15 tablet 5  . diltiazem (CARDIZEM CD) 120 MG 24 hr capsule TAKE 1 CAPSULE DAILY. (Patient taking differently: Take  120 mg by mouth daily) 90 capsule 3  . dorzolamide-timolol (COSOPT) 22.3-6.8 MG/ML ophthalmic solution Place 2 drops 2 (two) times daily into the right eye.     [name] ELIQUIS 5 MG TABS tablet TAKE 1 TABLET BY MOUTH TWICE DAILY. 60 tablet 3  . FLUoxetine (PROZAC) 20 MG capsule TAKE (1) CAPSULE DAILY. 30 capsule 2  . furosemide (LASIX) 40 MG tablet TAKE 1/2 TABLET ('20MG'$ ) DAILY ALTERNATING WITH 1 TABLET ('40MG'$ ). 90 tablet 3  . iron polysaccharides (POLY-IRON 150) 150 MG capsule Take 150 mg by mouth daily.    . Melatonin 5 MG TABS Take 5 mg by mouth at bedtime.    . Multiple Vitamin (MULTIVITAMIN WITH MINERALS) TABS tablet Take 1 tablet by mouth daily. Centrum Silver    . Polyethyl Glycol-Propyl Glycol (SYSTANE OP) Place 1-2 drops as needed  into both eyes (for dry eyes).    . potassium chloride SA (K-DUR,KLOR-CON) 20 MEQ tablet TAKE 1 TABLET ONCE DAILY. (Patient taking differently: Take 20 MEQ by mouth once daily) 90 tablet 3  . ranitidine (ZANTAC) 150 MG tablet Take 150 mg by mouth daily as needed for heartburn.     . rosuvastatin (CRESTOR) 40 MG tablet TAKE 1 TABLET ONCE DAILY. 30 tablet 6   No current facility-administered medications for this encounter.     BP (!) 137/59 (BP Location: Left Arm, Patient Position: Sitting, Cuff Size: Normal)   Pulse (!) 49   Wt 209 lb 12.8 oz (95.2 kg)   SpO2 99%   BMI 27.68 kg/m  General: NAD Neck: No JVD, no thyromegaly or thyroid nodule.  Lungs: Clear to auscultation bilaterally with normal respiratory effort. CV: Nondisplaced PMI.  Heart regular S1/S2, no S3/S4, no murmur.  No peripheral edema.  No carotid bruit.  Difficult to palpate pedal pulses.  Abdomen: Soft, nontender, no hepatosplenomegaly, no distention.  Skin: Intact without lesions or rashes.  Neurologic: Alert and oriented x 3.  Psych: Normal affect. Extremities: No clubbing or cyanosis.  HEENT: Normal.   Assessment/Plan: 1. Chronic diastolic CHF: Cardiac MRI done in 1/18 due to  uninterpretable echo.  This showed LV EF 66% and normal RV size and systolic function.  He does not look volume overloaded on exam today.  - Continue Lasix 40 mg daily alternating with 20 mg daily. BMET today.  2. Hyperlipidemia: Continue Crestor.  Check lipids today.   3. Carotid stenosis: s/p R CEA.  Dopplers followed at [facility], stable when done recently.  4. PAD: s/p patch angioplasty to left fem-pop bypass in 11/16.  Followed at [facility]. He is off cilostazol with CHF but no increase in mild stable claudication.  Evaluated recently at [facility]. 5. AAA: Stable on recent US, followed at [facility].    6. COPD: No longer smoking. PFTs in 1/18 showed moderately severe obstructive defect. CT chest in 11/18 showed emphysema.  He is not on inhalers due to glaucoma.   - He follows with pulmonary. 7. Atrial fibrillation: Patient remains in NSR on amiodarone.  CHADSVASC 3 (age, vascular disease, HTN).  He is also on diltiazem CD 120 mg daily.   - Limit ETOH.  - Use CPAP.  - Continue Eliquis. CBC today.  - He is maintaining NSR on amiodarone (had to stop Multaq due to CHF), dose down to 100 mg daily. Check LFTs today.  He gets regular eye exams. With increased dyspnea in the fall, I was concerned for possible amiodarone lung toxicity. No sign for this on CT chest (suggestive of emphysema) and I had him see pulmonary (Dr Melvyn Novas) who did not think he had amiodarone lung toxicity.   8. CKD: BMET today.  9. CAD: LHC in 11/18 with nonobstructive disease only.   10. Dyspnea: Suspect mainly due to COPD.  Somewhat improved recently.  I will refer for pulmonary rehab.      [name] 10/27/2017

## 2017-10-30 ENCOUNTER — Ambulatory Visit (INDEPENDENT_AMBULATORY_CARE_PROVIDER_SITE_OTHER): Payer: Commercial Managed Care - PPO | Admitting: *Deleted

## 2017-10-30 DIAGNOSIS — I480 Paroxysmal atrial fibrillation: Secondary | ICD-10-CM | POA: Diagnosis not present

## 2017-10-31 NOTE — Progress Notes (Signed)
Carelink Summary Report / Loop Recorder 

## 2017-11-02 NOTE — Patient Instructions (Addendum)
  Your a1c was checked today.    No immunizations administered today.   Medications reviewed and updated.  No changes recommended at this time.   Please followup in 6 months

## 2017-11-02 NOTE — Progress Notes (Signed)
Subjective:    Patient ID: Troy Kenner [name], male    DOB: [birth year], 77 y.o.   MRN: [id number]  HPI The patient is here for follow up.  Afib, Hypertension: Hypertension: He is taking his medication daily. He is compliant with a low sodium diet.  He denies chest pain, palpitations,  and regular headaches. He is exercising regularly.    Hyperlipidemia: He is taking his medication daily. He is compliant with a low fat/cholesterol diet. He is exercising regularly. He denies myalgias.   Prediabetes:  He is not compliant with a low sugar/carbohydrate diet. He drinks soda.  He is exercising regularly - -goes to a trainer twice a week.  COPD:  His shortness of breath has gotten worse.  He feels it has progressively worse.  He has some wheeze, but denies cough.  He is considering pulmonary rehab.    Medications and allergies reviewed with patient and updated if appropriate.  Patient Active Problem List   Diagnosis Date Noted  . Chronic diastolic CHF (congestive heart failure) ([place]) 05/04/2017  . CAD (coronary artery disease) 05/03/2017  . MGUS (monoclonal gammopathy of unknown significance) 03/07/2016  . Benign essential HTN 01/19/2016  . Atherosclerotic peripheral vascular disease ([name]) 08/29/2015  . OSA (obstructive sleep apnea) 08/29/2015  . PAF (paroxysmal atrial fibrillation) ([place])   . Glaucoma 06/22/2015  . Dyspnea 02/04/2015  . Memory loss, short term 08/03/2014  . Pain of right lower leg- and Left Thigh and leg 07/28/2014  . Anemia, unspecified 12/28/2013  . Carotid stenosis 07/24/2013  . Occlusion and stenosis of carotid artery without mention of cerebral infarction 07/09/2013  . COPD GOLD II  07/09/2013  . Chronic renal disease, stage 3, moderately decreased glomerular filtration rate between 30-59 mL/min/1.73 square meter (HCC) 07/06/2013  . TIA (transient ischemic attack) 07/06/2013  . Chest pain 12/01/2012  . Fatigue 12/01/2012  . Right carotid bruit 12/01/2012  .  Compulsive tobacco user syndrome 10/16/2012  . Basal cell carcinoma of skin 10/10/2012  . History of lung cancer 03/11/2012  . Lung nodule, multiple 03/11/2012  . PAD (peripheral artery disease) ([place]) 12/16/2011  . AAA (abdominal aortic aneurysm) ([place]) 09/27/2011  . Squamous cell lung cancer ([place]) 11/08/2010  . GAIT IMBALANCE 03/02/2009  . Prediabetes 03/02/2009  . COLONIC POLYPS, HX OF 03/02/2009  . OBSTRUCTIVE CHRONIC BRONCHITIS WITHOUT EXACERBAT 01/15/2008  . HYPERPLASIA PROSTATE UNS W/O UR OBST & OTH LUTS 01/15/2008  . HYPERLIPIDEMIA 09/29/2007  . Peripheral vascular disease ([place]) 09/29/2007  . LEUKOPLAKIA, VOCAL CORDS 09/29/2007  . FEMORAL BRUIT 09/29/2007    Current Outpatient Medications on File Prior to Visit  Medication Sig Dispense Refill  . acetaminophen (TYLENOL) 325 MG tablet Take 650 mg every 6 (six) hours as needed by mouth for moderate pain or headache.    [name] amiodarone (PACERONE) 200 MG tablet TAKE 1/2 TABLET EVERY DAY. 15 tablet 2  . bimatoprost (LUMIGAN) 0.01 % SOLN Place 1 drop at bedtime into both eyes.     . Biotin 10000 MCG TABS Take 10,000 mcg daily by mouth.     [name] CARDURA 4 MG tablet TAKE (1/2) TABLET DAILY. 15 tablet 5  . diltiazem (CARDIZEM CD) 120 MG 24 hr capsule TAKE 1 CAPSULE DAILY. (Patient taking differently: Take 120 mg by mouth daily) 90 capsule 3  . dorzolamide-timolol (COSOPT) 22.3-6.8 MG/ML ophthalmic solution Place 2 drops 2 (two) times daily into the right eye.     [name] ELIQUIS 5 MG TABS tablet TAKE 1 TABLET BY MOUTH  TWICE DAILY. 60 tablet 3  . FLUoxetine (PROZAC) 20 MG capsule TAKE (1) CAPSULE DAILY. 30 capsule 2  . furosemide (LASIX) 40 MG tablet TAKE 1/2 TABLET (20MG ) DAILY ALTERNATING WITH 1 TABLET (40MG ). 90 tablet 3  . iron polysaccharides (POLY-IRON 150) 150 MG capsule Take 150 mg by mouth daily.    . Melatonin 5 MG TABS Take 5 mg by mouth at bedtime.    . Multiple Vitamin (MULTIVITAMIN WITH MINERALS) TABS tablet Take 1 tablet by mouth daily.  Centrum Silver    . Polyethyl Glycol-Propyl Glycol (SYSTANE OP) Place 1-2 drops as needed into both eyes (for dry eyes).    . potassium chloride SA (K-DUR,KLOR-CON) 20 MEQ tablet TAKE 1 TABLET ONCE DAILY. (Patient taking differently: Take 20 MEQ by mouth once daily) 90 tablet 3  . ranitidine (ZANTAC) 150 MG tablet Take 150 mg by mouth daily as needed for heartburn.     . rosuvastatin (CRESTOR) 40 MG tablet TAKE 1 TABLET ONCE DAILY. 30 tablet 6   No current facility-administered medications on file prior to visit.     Past Medical History:  Diagnosis Date  . AAA (abdominal aortic aneurysm) (HCC) LAST ABD. [name]  JAN 2014  3.4CM   MONITORED BY DR [name]  . Arthritis   . Atrial fibrillation ([place])   . Benign essential HTN 01/19/2016  . Bilateral carotid artery stenosis DUPLEX 12-29-2012  BY DR [facility]   BILATERAL ICA STENOSIS 60-79%  . BPH (benign prostatic hypertrophy)   . Cancer ([place]) 01-25-12   LUNG  . Complication of anesthesia    had nausea after carotid artery surgery, states the medicine given to help the nausea, made it worse  . COPD (chronic obstructive pulmonary disease) ([place])   . Exertional dyspnea   . Frequency of urination   . GERD (gastroesophageal reflux disease)   . Glaucoma BOTH EYES   Dr Gershon Crane  . History of basal cell carcinoma excision   . History of lung cancer APRIL 2012  SQUAMOUS CELL---- S/P RIGHT LOWER LOBECTOMY AT [facility] --  NO CHEMORADIATION---  NO RECURRENCE    ONCOLOGIST- DR Tressie Stalker  LOV [facility] 10-27-2012  . Hx of adenomatous colonic polyps 2005    X 2; 1 hyperplastic polyp; Dr Olevia Perches  . Hyperlipidemia   . Hypertension   . Impaired fasting glucose 2007   108; A1c5.4%  . Lesion of bladder   . Microhematuria   . Nocturia   . OSA (obstructive sleep apnea) 08/29/2015   Mild with AHI overall 6/hr but severe during REM sleep with AHI 30/hr with oxygen saturations < 88% for 5.5 minutes of total sleep time.    [name] PAD (peripheral artery disease) ([place]) ABI'S  JAN  2014  0.65 ON RIGHT ;  1.04 ON LEFT  . Peripheral vascular disease ([place]) S/P ANGIOPLASTY AND STENTING   FOLLOWED  BY DR [name]  . Spinal stenosis Sept. 2015  . Stroke [facility]) Jul 07, 2013   mini  TIA  . Urgency of urination     Past Surgical History:  Procedure Laterality Date  . ANGIO PLASTY     X 4 in legs  . AORTOGRAM  07-27-2002   MILD DIFFUSE ILIAC ARTERY OCCLUSIVE DISEASE /  LEFT RENAL ARTERY 20%/ PATENT LEFT FEM-POP GRAFT/ MILD SFA AND POPLITEAL ARTERY OCCLUSIVE DISEASE W/ SEVERE KIDNEY OCCLUSIVE DISEASE  . BASAL CELL CARCINOMA EXCISION     MULTIPLE TIMES--  RIGHT FOREARM, CHEEKS, AND BACK  . CARDIOVASCULAR STRESS TEST  12-08-2012  DR [facility]   NORMAL LEXISCAN WITH NO EXERCISE NUCLEAR STUDY/ EF 66%/   NO ISCHEMIA/ NO SIGNIFICANT CHANGE FROM PRIOR STUDY  . CAROTID ANGIOGRAM N/A 07/10/2013   Procedure: CAROTID ANGIOGRAM;  Surgeon: Elam Dutch, MD;  Location[facility] LAB;  Service: Cardiovascular;  Laterality: N/A;  . CAROTID ENDARTERECTOMY Right 07-14-13   cea  . CATARACT EXTRACTION W/ INTRAOCULAR LENS  IMPLANT, BILATERAL    . colon polyectomy    . CYSTOSCOPY W/ RETROGRADES Bilateral 01/21/2013   Procedure: CYSTOSCOPY WITH RETROGRADE PYELOGRAM;  Surgeon: Molli Hazard, MD;  Location: [facility];  Service: Urology;  Laterality: Bilateral;   CYSTO, BLADDER BIOPSY, BILATERAL RETROGRADE PYELOGRAM  RAD TECH FROM RADIOLOGY PER JOY  . CYSTOSCOPY WITH BIOPSY N/A 01/21/2013   Procedure: CYSTOSCOPY WITH BIOPSY;  Surgeon: Molli Hazard, MD;  Location: [facility];  Service: Urology;  Laterality: N/A;  . ENDARTERECTOMY Right 07/14/2013   Procedure: ENDARTERECTOMY CAROTID;  Surgeon: Angelia Mould, MD;  Location: [facility];  Service: Vascular;  Laterality: Right;  . EP IMPLANTABLE DEVICE N/A 08/12/2015   Procedure: Loop Recorder Insertion;  Surgeon: Thompson Grayer, MD;  Location: [place] CV LAB;  Service: Cardiovascular;  Laterality: N/A;    . FEMORAL-POPLITEAL BYPASS GRAFT Left 1994  [facility]   AND 2001  IN [place]  . FEMORAL-POPLITEAL BYPASS GRAFT Left 08/30/2015   Procedure: REVISION OF BYPASS GRAFT Left  FEMORAL-POPLITEAL ARTERY;  Surgeon: Angelia Mould, MD;  Location: [place];  Service: Vascular;  Laterality: Left;  . LARYNGOSCOPY  06-27-2004   BX VOCAL CORD  (LEUKOPLAKIA)  PER PT NO ISSUES SINCE  . LEFT HEART CATH AND CORONARY ANGIOGRAPHY N/A 08/20/2017   Procedure: LEFT HEART CATH AND CORONARY ANGIOGRAPHY;  Surgeon: Larey Dresser, MD;  Location: [place] CV LAB;  Service: Cardiovascular;  Laterality: N/A;  . LOWER EXTREMITY ANGIOGRAM Bilateral 08/29/2015   Procedure: Lower Extremity Angiogram;  Surgeon: Angelia Mould, MD;  Location: [place] CV LAB;  Service: Cardiovascular;  Laterality: Bilateral;  . LUNG LOBECTOMY  01/24/2011    RIGHT UPPER LOBE  (SQUAMOUS CELL CARCINOMA) Dr Dorthea Cove , [facility]. No chemotherapyor radiation  . PATCH ANGIOPLASTY Right 07/14/2013   Procedure: PATCH ANGIOPLASTY;  Surgeon: Angelia Mould, MD;  Location: [place];  Service: Vascular;  Laterality: Right;  . PATCH ANGIOPLASTY Left 08/30/2015   Procedure: VEIN PATCH ANGIOPLASTY OF PROXIMAL Left BYPASS GRAFT;  Surgeon: Angelia Mould, MD;  Location: [place];  Service: Vascular;  Laterality: Left;  . PERIPHERAL VASCULAR CATHETERIZATION N/A 08/29/2015   Procedure: Abdominal Aortogram;  Surgeon: Angelia Mould, MD;  Location: [place] CV LAB;  Service: Cardiovascular;  Laterality: N/A;  . trabecular surgery     OS  . TRANSTHORACIC ECHOCARDIOGRAM  12-29-2012  DR [facility]   MILD LVH/  LVSF NORMAL/ EF 09-73%/  GRADE I DIASTOLIC DYSFUNCTION    Social History   Socioeconomic History  . Marital status: Married    Spouse name: [name]   . Number of children: 3  . Years of education: 12+  . Highest education level: None  Social Needs  . Financial resource strain: None  . Food insecurity - worry: None  . Food  insecurity - inability: None  . Transportation needs - medical: None  . Transportation needs - non-medical: None  Occupational History  . None  Tobacco Use  . Smoking status: Former Smoker    Packs/day: 0.50    Years: 50.00  Pack years: 25.00    Types: Cigarettes    Last attempt to quit: 07/29/2011    Years since quitting: 6.2  . Smokeless tobacco: Never Used  Substance and Sexual Activity  . Alcohol use: Yes    Alcohol/week: 0.0 oz    Comment:  socially, variable  . Drug use: No  . Sexual activity: None  Other Topics Concern  . None  Social History Narrative   Patient lives at home with spouse [name]   Patient has 3 children    Patient is right handed     Family History  Problem Relation Age of Onset  . Stroke Mother        mini strokes  . Alcohol abuse Father   . Heart disease Father        MI after 26  . Stroke Father   . Hypertension Father   . Heart attack Father   . Heart disease Paternal Aunt        several  . Hypertension Paternal Aunt        several  . Stroke Paternal Aunt        several  . Stroke Paternal Uncle        several  . Heart disease Paternal Uncle        several;2 had MI pre 5    Review of Systems  Constitutional: Negative for chills and fever.  Respiratory: Positive for shortness of breath and wheezing (occasional). Negative for cough.   Cardiovascular: Positive for leg swelling (occ, mild). Negative for chest pain and palpitations.  Neurological: Negative for light-headedness and headaches.       Objective:   Vitals:   11/04/17 1358  BP: (!) 146/62  Pulse: (!) 55  Resp: 16  Temp: 97.7 F (36.5 C)  SpO2: 97%   Wt Readings from Last 3 Encounters:  11/04/17 211 lb (95.7 kg)  10/25/17 209 lb 12.8 oz (95.2 kg)  10/09/17 212 lb (96.2 kg)   Body mass index is 27.84 kg/m.   Physical Exam    Constitutional: Appears well-developed and well-nourished. No distress.  HENT:  Head: Normocephalic and atraumatic.  Neck: Neck  supple. No tracheal deviation present. No thyromegaly present.  No cervical lymphadenopathy Cardiovascular: Normal rate, regular rhythm and normal heart sounds.   No murmur heard. No carotid bruit .  No edema Pulmonary/Chest: Effort normal and breath sounds normal. No respiratory distress. No has no wheezes. No rales.  Skin: Skin is warm and dry. Not diaphoretic.  Psychiatric: Normal mood and affect. Behavior is normal.      Assessment & Plan:    See Problem List for Assessment and Plan of chronic medical problems.

## 2017-11-04 ENCOUNTER — Encounter: Payer: Self-pay | Admitting: Internal Medicine

## 2017-11-04 ENCOUNTER — Ambulatory Visit (INDEPENDENT_AMBULATORY_CARE_PROVIDER_SITE_OTHER): Payer: Commercial Managed Care - PPO | Admitting: Internal Medicine

## 2017-11-04 VITALS — BP 146/62 | HR 55 | Temp 97.7°F | Resp 16 | Wt 211.0 lb

## 2017-11-04 DIAGNOSIS — R7303 Prediabetes: Secondary | ICD-10-CM | POA: Diagnosis not present

## 2017-11-04 DIAGNOSIS — I480 Paroxysmal atrial fibrillation: Secondary | ICD-10-CM | POA: Diagnosis not present

## 2017-11-04 DIAGNOSIS — E782 Mixed hyperlipidemia: Secondary | ICD-10-CM | POA: Diagnosis not present

## 2017-11-04 DIAGNOSIS — I10 Essential (primary) hypertension: Secondary | ICD-10-CM | POA: Diagnosis not present

## 2017-11-04 LAB — POCT GLYCOSYLATED HEMOGLOBIN (HGB A1C): HEMOGLOBIN A1C: 5.9

## 2017-11-04 NOTE — Assessment & Plan Note (Addendum)
In sinus now Asymptomatic Continue current medications

## 2017-11-04 NOTE — Assessment & Plan Note (Signed)
Lipid panel well controlled Continue statin

## 2017-11-04 NOTE — Assessment & Plan Note (Signed)
slightly elevated today Current regimen effective and well tolerated Continue current medications at current doses

## 2017-11-04 NOTE — Assessment & Plan Note (Signed)
Check a1c Low sugar / carb diet Stressed regular exercise   

## 2017-11-07 ENCOUNTER — Telehealth (HOSPITAL_COMMUNITY): Payer: Self-pay

## 2017-11-07 NOTE — Telephone Encounter (Signed)
Patient stopped by facility and wanted to schedule for Pulmonary Rehab - Reopened referral. Scheduled orientation on 11/18/2017 at 12:00pm. Patient will attend the 9:30am exc class.

## 2017-11-12 LAB — CUP PACEART REMOTE DEVICE CHECK
Date Time Interrogation Session: 20190130230951
Implantable Pulse Generator Implant Date: 20161111

## 2017-11-18 ENCOUNTER — Encounter (HOSPITAL_COMMUNITY)
Admission: RE | Admit: 2017-11-18 | Discharge: 2017-11-18 | Disposition: A | Discharge status: Home / Self Care | Payer: Commercial Managed Care - PPO | Source: Ambulatory Visit | Attending: Internal Medicine | Admitting: Internal Medicine

## 2017-11-18 VITALS — BP 138/56 | HR 50 | Ht 71.0 in | Wt 211.0 lb

## 2017-11-18 DIAGNOSIS — J449 Chronic obstructive pulmonary disease, unspecified: Secondary | ICD-10-CM | POA: Insufficient documentation

## 2017-11-18 NOTE — Progress Notes (Signed)
Troy Kenner [name] 77 y.o. male Pulmonary Rehab Orientation Note Patient arrived today in Cardiac and Pulmonary Rehab for orientation to Pulmonary Rehab. He walked from [employer] with his wife without difficulty. He does not carry portable oxygen. Per pt, he does not use oxygen, but wears a CPAP machine at night. Color good, skin warm and dry. Patient is oriented to time and place. Patient's medical history, psychosocial health, and medications reviewed. Psychosocial assessment reveals pt lives with their spouse. Pt is currently retired, he was the [employer].  Pt hobbies include hunting and golf. Unfortunately his glaucoma prevents him from hunting, but he goes with his friends occasionally.  His friends play golf with him, they assist with were he hits his ball.  Pt reports his stress level is low. Areas of stress/anxiety include Health.  Pt does not exhibit  signs of depression.  PHQ2/9 score 1/0. Pt shows good  coping skills with positive outlook .  Will continue to monitor and evaluate progress toward psychosocial goal(s) of no barriers to participation in pulmonary rehab. Physical assessment reveals heart rate is normal, breath sounds clear to auscultation, no wheezes, rales, or rhonchi. Grip strength equal, strong. Distal pulses 2+ bilateral posterior tibial pulses present. Patient reports he does take medications as prescribed. Patient states he follows a Regular diet. The patient reports no specific efforts to gain or lose weight.. Patient's weight will be monitored closely. Demonstration and practice of PLB using pulse oximeter. Patient able to return demonstration satisfactorily. Safety and hand hygiene in the exercise area reviewed with patient. Patient voices understanding of the information reviewed. Department expectations discussed with patient and achievable goals were set. The patient shows enthusiasm about attending the program and we look forward to working with this nice  gentleman. The patient is scheduled for a 6 min walk test on Tuesday, November 26, 2017 @ 3:45 pm and to begin exercise on Tuesday, December 03, 2017 in the 0930 class.  [id number]

## 2017-11-21 ENCOUNTER — Encounter (HOSPITAL_COMMUNITY): Payer: Self-pay | Admitting: *Deleted

## 2017-11-26 ENCOUNTER — Encounter (HOSPITAL_COMMUNITY)
Admission: RE | Admit: 2017-11-26 | Discharge: 2017-11-26 | Disposition: A | Discharge status: Home / Self Care | Payer: Commercial Managed Care - PPO | Source: Ambulatory Visit | Attending: Internal Medicine | Admitting: Internal Medicine

## 2017-11-26 DIAGNOSIS — J449 Chronic obstructive pulmonary disease, unspecified: Secondary | ICD-10-CM | POA: Diagnosis present

## 2017-11-28 NOTE — Progress Notes (Signed)
Pulmonary Individual Treatment Plan  Patient Details  Name: Troy Steidle [name] MRN: [id number] Date of Birth: [birth year] Referring Provider:     Pulmonary Rehab Walk Test from 11/26/2017 in [place]  Referring Provider  Dr. Melvyn Novas      Initial Encounter Date:    Pulmonary Rehab Walk Test from 11/26/2017 in [place]  Date  11/28/17  Referring Provider  Dr. Melvyn Novas      Visit Diagnosis: Chronic obstructive pulmonary disease, unspecified COPD type ([place])  Patient's Home Medications on Admission:   Current Outpatient Medications:  .  acetaminophen (TYLENOL) 325 MG tablet, Take 650 mg every 6 (six) hours as needed by mouth for moderate pain or headache., Disp: , Rfl:  .  amiodarone (PACERONE) 200 MG tablet, TAKE 1/2 TABLET EVERY DAY., Disp: 15 tablet, Rfl: 2 .  bimatoprost (LUMIGAN) 0.01 % SOLN, Place 1 drop at bedtime into both eyes. , Disp: , Rfl:  .  Biotin 10000 MCG TABS, Take 10,000 mcg daily by mouth. , Disp: , Rfl:  .  CARDURA 4 MG tablet, TAKE (1/2) TABLET DAILY., Disp: 15 tablet, Rfl: 5 .  diltiazem (CARDIZEM CD) 120 MG 24 hr capsule, TAKE 1 CAPSULE DAILY. (Patient taking differently: Take 120 mg by mouth daily), Disp: 90 capsule, Rfl: 3 .  dorzolamide-timolol (COSOPT) 22.3-6.8 MG/ML ophthalmic solution, Place 2 drops 2 (two) times daily into the right eye. , Disp: , Rfl:  .  ELIQUIS 5 MG TABS tablet, TAKE 1 TABLET BY MOUTH TWICE DAILY., Disp: 60 tablet, Rfl: 3 .  FLUoxetine (PROZAC) 20 MG capsule, TAKE (1) CAPSULE DAILY., Disp: 30 capsule, Rfl: 2 .  furosemide (LASIX) 40 MG tablet, TAKE 1/2 TABLET ('20MG'$ ) DAILY ALTERNATING WITH 1 TABLET ('40MG'$ )., Disp: 90 tablet, Rfl: 3 .  iron polysaccharides (POLY-IRON 150) 150 MG capsule, Take 150 mg by mouth daily., Disp: , Rfl:  .  Melatonin 5 MG TABS, Take 5 mg by mouth at bedtime., Disp: , Rfl:  .  Multiple Vitamin (MULTIVITAMIN WITH MINERALS) TABS tablet, Take 1 tablet by mouth  daily. Centrum Silver, Disp: , Rfl:  .  Polyethyl Glycol-Propyl Glycol (SYSTANE OP), Place 1-2 drops as needed into both eyes (for dry eyes)., Disp: , Rfl:  .  potassium chloride SA (K-DUR,KLOR-CON) 20 MEQ tablet, TAKE 1 TABLET ONCE DAILY. (Patient taking differently: Take 20 MEQ by mouth once daily), Disp: 90 tablet, Rfl: 3 .  ranitidine (ZANTAC) 150 MG tablet, Take 150 mg by mouth daily as needed for heartburn. , Disp: , Rfl:  .  rosuvastatin (CRESTOR) 40 MG tablet, TAKE 1 TABLET ONCE DAILY., Disp: 30 tablet, Rfl: 6  Past Medical History: Past Medical History:  Diagnosis Date  . AAA (abdominal aortic aneurysm) (HCC) LAST ABD. [name]  JAN 2014  3.4CM   MONITORED BY DR [name]  . Arthritis   . Atrial fibrillation ([place])   . Benign essential HTN 01/19/2016  . Bilateral carotid artery stenosis DUPLEX 12-29-2012  BY DR [facility]   BILATERAL ICA STENOSIS 60-79%  . BPH (benign prostatic hypertrophy)   . Cancer ([place]) 01-25-12   LUNG  . Complication of anesthesia    had nausea after carotid artery surgery, states the medicine given to help the nausea, made it worse  . COPD (chronic obstructive pulmonary disease) ([place])   . Exertional dyspnea   . Frequency of urination   . GERD (gastroesophageal reflux disease)   . Glaucoma BOTH EYES   Dr Gershon Crane  .  History of basal cell carcinoma excision   . History of lung cancer APRIL 2012  SQUAMOUS CELL---- S/P RIGHT LOWER LOBECTOMY AT [facility] --  NO CHEMORADIATION---  NO RECURRENCE    ONCOLOGIST- DR Tressie Stalker  LOV IN [facility] 10-27-2012  . Hx of adenomatous colonic polyps 2005    X 2; 1 hyperplastic polyp; Dr Olevia Perches  . Hyperlipidemia   . Hypertension   . Impaired fasting glucose 2007   108; A1c5.4%  . Lesion of bladder   . Microhematuria   . Nocturia   . OSA (obstructive sleep apnea) 08/29/2015   Mild with AHI overall 6/hr but severe during REM sleep with AHI 30/hr with oxygen saturations < 88% for 5.5 minutes of total sleep time.    [name] PAD (peripheral artery  disease) ([place]) ABI'S  JAN 2014  0.65 ON RIGHT ;  1.04 ON LEFT  . Peripheral vascular disease ([place]) S/P ANGIOPLASTY AND STENTING   FOLLOWED  BY DR [name]  . Spinal stenosis Sept. 2015  . Stroke [facility]) Jul 07, 2013   mini  TIA  . Urgency of urination     Tobacco Use: Social History   Tobacco Use  Smoking Status Former Smoker  . Packs/day: 0.50  . Years: 50.00  . Pack years: 25.00  . Types: Cigarettes  . Last attempt to quit: 07/29/2011  . Years since quitting: 6.3  Smokeless Tobacco Never Used    Labs: Recent Review Flowsheet Data    Labs for ITP Cardiac and Pulmonary Rehab Latest Ref Rng & Units 01/04/2016 11/27/2016 05/03/2017 10/25/2017 11/04/2017   Cholestrol 0 - 200 mg/dL 95(L) 113 - 105 -   LDLCALC 0 - 99 mg/dL 39 39 - 50 -   HDL >40 mg/dL 39(L) 46 - 35(L) -   Trlycerides <150 mg/dL 86 140 - 101 -   Hemoglobin A1c - - - 5.7 - 5.9   TCO2 0 - 100 mmol/L - - - - -      Capillary Blood Glucose: Lab Results  Component Value Date   GLUCAP 97 08/29/2015   GLUCAP 111 (H) 07/07/2013   GLUCAP 112 (H) 12/22/2010     Pulmonary Assessment Scores: Pulmonary Assessment Scores    Row Name 11/21/17 1209 11/28/17 0841       ADL UCSD   ADL Phase  Entry  Entry    SOB Score total  64  -      CAT Score   CAT Score  18 Entry  -      mMRC Score   mMRC Score  -  1       Pulmonary Function Assessment: Pulmonary Function Assessment - 11/18/17 1243      Pulmonary Function Tests   FEV1%  62 %    FEV1/FVC Ratio  64       Exercise Target Goals: Date: 11/28/17  Exercise Program Goal: Individual exercise prescription set using results from initial 6 min walk test and THRR while considering  patient's activity barriers and safety.    Exercise Prescription Goal: Initial exercise prescription builds to 30-45 minutes a day of aerobic activity, 2-3 days per week.  Home exercise guidelines will be given to patient during program as part of exercise prescription that the participant  will acknowledge.  Activity Barriers & Risk Stratification: Activity Barriers & Cardiac Risk Stratification - 11/18/17 1247      Activity Barriers & Cardiac Risk Stratification   Activity Barriers  -- eyesight       6 Minute  Walk: 6 Minute Walk    Row Name 11/28/17 0838         6 Minute Walk   Phase  Initial     Distance  1200 feet     Walk Time  6 minutes     # of Rest Breaks  0     MPH  2.27     METS  3.3     RPE  13     Perceived Dyspnea   3     Symptoms  Yes (comment)     Comments  8/10 calf pain     Resting HR  59 bpm     Resting BP  124/58     Resting Oxygen Saturation   94 %     Exercise Oxygen Saturation  during 6 min walk  92 %     Max Ex. HR  117 bpm     Max Ex. BP  146/60       Interval HR   1 Minute HR  98     2 Minute HR  98     3 Minute HR  96     4 Minute HR  117     5 Minute HR  117     6 Minute HR  70     2 Minute Post HR  59     Interval Heart Rate?  Yes       Interval Oxygen   Interval Oxygen?  Yes     Baseline Oxygen Saturation %  94 %     1 Minute Oxygen Saturation %  94 %     1 Minute Liters of Oxygen  0 L     2 Minute Oxygen Saturation %  93 %     2 Minute Liters of Oxygen  0 L     3 Minute Oxygen Saturation %  92 %     3 Minute Liters of Oxygen  0 L     4 Minute Oxygen Saturation %  93 %     4 Minute Liters of Oxygen  0 L     5 Minute Oxygen Saturation %  92 %     5 Minute Liters of Oxygen  0 L     6 Minute Oxygen Saturation %  94 %     6 Minute Liters of Oxygen  0 L     2 Minute Post Oxygen Saturation %  97 %     2 Minute Post Liters of Oxygen  0 L        Oxygen Initial Assessment: Oxygen Initial Assessment - 11/28/17 0841      Initial 6 min Walk   Oxygen Used  None      Program Oxygen Prescription   Program Oxygen Prescription  None       Oxygen Re-Evaluation:   Oxygen Discharge (Final Oxygen Re-Evaluation):   Initial Exercise Prescription: Initial Exercise Prescription - 11/28/17 0800      Date of Initial  Exercise RX and Referring Provider   Date  11/28/17    Referring Provider  Dr. Melvyn Novas      Bike   Level  0.6    Minutes  17      NuStep   Level  2    SPM  80    Minutes  17    METs  1.5      Track   Laps  10    Minutes  17  Prescription Details   Frequency (times per week)  2    Duration  Progress to 45 minutes of aerobic exercise without signs/symptoms of physical distress      Intensity   THRR 40-80% of Max Heartrate  57-114    Ratings of Perceived Exertion  11-13    Perceived Dyspnea  0-4      Progression   Progression  Continue progressive overload as per policy without signs/symptoms or physical distress.      Resistance Training   Training Prescription  Yes    Weight  BLUE BANDS    Reps  10-15       Perform Capillary Blood Glucose checks as needed.  Exercise Prescription Changes:   Exercise Comments:   Exercise Goals and Review:   Exercise Goals Re-Evaluation :   Discharge Exercise Prescription (Final Exercise Prescription Changes):   Nutrition:  Target Goals: Understanding of nutrition guidelines, daily intake of sodium 1500mg , cholesterol 200mg , calories 30% from fat and 7% or less from saturated fats, daily to have 5 or more servings of fruits and vegetables.  Biometrics: Pre Biometrics - 11/18/17 1248      Pre Biometrics   Grip Strength  28 kg        Nutrition Therapy Plan and Nutrition Goals:   Nutrition Assessments:   Nutrition Goals Re-Evaluation:   Nutrition Goals Discharge (Final Nutrition Goals Re-Evaluation):   Psychosocial: Target Goals: Acknowledge presence or absence of significant depression and/or stress, maximize coping skills, provide positive support system. Participant is able to verbalize types and ability to use techniques and skills needed for reducing stress and depression.  Initial Review & Psychosocial Screening: Initial Psych Review & Screening - 11/18/17 1253      Initial Review   Current issues  with  None Identified      Family Dynamics   Good Support System?  Yes      Barriers   Psychosocial barriers to participate in program  There are no identifiable barriers or psychosocial needs.      Screening Interventions   Interventions  Encouraged to exercise       Quality of Life Scores:  Scores of 19 and below usually indicate a poorer quality of life in these areas.  A difference of  2-3 points is a clinically meaningful difference.  A difference of 2-3 points in the total score of the Quality of Life Index has been associated with significant improvement in overall quality of life, self-image, physical symptoms, and general health in studies assessing change in quality of life.   PHQ-9: Recent Review Flowsheet Data    Depression screen [facility] 2/9 11/18/2017 11/04/2017 01/12/2015   Decreased Interest 0 0 0   Down, Depressed, Hopeless 0 0 0   PHQ - 2 Score 0 0 0     Interpretation of Total Score  Total Score Depression Severity:  1-4 = Minimal depression, 5-9 = Mild depression, 10-14 = Moderate depression, 15-19 = Moderately severe depression, 20-27 = Severe depression   Psychosocial Evaluation and Intervention: Psychosocial Evaluation - 11/18/17 1254      Psychosocial Evaluation & Interventions   Interventions  Encouraged to exercise with the program and follow exercise prescription  (Pended)     Expected Outcomes  no barriers to participation in pulmonary rehab  (Pended)     Continue Psychosocial Services   No Follow up required  (Pended)        Psychosocial Re-Evaluation:   Psychosocial Discharge (Final Psychosocial Re-Evaluation):   Education:  Education Goals: Education classes will be provided on a weekly basis, covering required topics. Participant will state understanding/return demonstration of topics presented.  Learning Barriers/Preferences: Learning Barriers/Preferences - 11/18/17 1241      Learning Barriers/Preferences   Learning Barriers  Sight     Learning Preferences  Individual Instruction;Skilled Demonstration;Verbal Instruction calls family with questions       Education Topics: Risk Factor Reduction:  -Group instruction that is supported by a PowerPoint presentation. Instructor discusses the definition of a risk factor, different risk factors for pulmonary disease, and how the heart and lungs work together.     Nutrition for Pulmonary Patient:  -Group instruction provided by PowerPoint slides, verbal discussion, and written materials to support subject matter. The instructor gives an explanation and review of healthy diet recommendations, which includes a discussion on weight management, recommendations for fruit and vegetable consumption, as well as protein, fluid, caffeine, fiber, sodium, sugar, and alcohol. Tips for eating when patients are short of breath are discussed.   Pursed Lip Breathing:  -Group instruction that is supported by demonstration and informational handouts. Instructor discusses the benefits of pursed lip and diaphragmatic breathing and detailed demonstration on how to preform both.     Oxygen Safety:  -Group instruction provided by PowerPoint, verbal discussion, and written material to support subject matter. There is an overview of "What is Oxygen" and "Why do we need it".  Instructor also reviews how to create a safe environment for oxygen use, the importance of using oxygen as prescribed, and the risks of noncompliance. There is a brief discussion on traveling with oxygen and resources the patient may utilize.   Oxygen Equipment:  -Group instruction provided by [facility] [employer] Staff utilizing handouts, written materials, and equipment demonstrations.   Signs and Symptoms:  -Group instruction provided by written material and verbal discussion to support subject matter. Warning signs and symptoms of infection, stroke, and heart attack are reviewed and when to call the physician/911 reinforced. Tips for  preventing the spread of infection discussed.   Advanced Directives:  -Group instruction provided by verbal instruction and written material to support subject matter. Instructor reviews Advanced Directive laws and proper instruction for filling out document.   Pulmonary Video:  -Group video education that reviews the importance of medication and oxygen compliance, exercise, good nutrition, pulmonary hygiene, and pursed lip and diaphragmatic breathing for the pulmonary patient.   Exercise for the Pulmonary Patient:  -Group instruction that is supported by a PowerPoint presentation. Instructor discusses benefits of exercise, core components of exercise, frequency, duration, and intensity of an exercise routine, importance of utilizing pulse oximetry during exercise, safety while exercising, and options of places to exercise outside of rehab.     Pulmonary Medications:  -Verbally interactive group education provided by instructor with focus on inhaled medications and proper administration.   Anatomy and Physiology of the Respiratory System and Intimacy:  -Group instruction provided by PowerPoint, verbal discussion, and written material to support subject matter. Instructor reviews respiratory cycle and anatomical components of the respiratory system and their functions. Instructor also reviews differences in obstructive and restrictive respiratory diseases with examples of each. Intimacy, Sex, and Sexuality differences are reviewed with a discussion on how relationships can change when diagnosed with pulmonary disease. Common sexual concerns are reviewed.   MD DAY -A group question and answer session with a medical doctor that allows participants to ask questions that relate to their pulmonary disease state.   OTHER EDUCATION -Group or individual verbal, written, or video  instructions that support the educational goals of the pulmonary rehab program.   Holiday Eating Survival Tips:   -Group instruction provided by PowerPoint slides, verbal discussion, and written materials to support subject matter. The instructor gives patients tips, tricks, and techniques to help them not only survive but enjoy the holidays despite the onslaught of food that accompanies the holidays.   Knowledge Questionnaire Score: Knowledge Questionnaire Score - 11/21/17 1209      Knowledge Questionnaire Score   Pre Score  11/18       Core Components/Risk Factors/Patient Goals at Admission: Personal Goals and Risk Factors at Admission - 11/18/17 1251      Core Components/Risk Factors/Patient Goals on Admission   Improve shortness of breath with ADL's  Yes    Intervention  Provide education, individualized exercise plan and daily activity instruction to help decrease symptoms of SOB with activities of daily living.    Expected Outcomes  Short Term: Improve cardiorespiratory fitness to achieve a reduction of symptoms when performing ADLs;Long Term: Be able to perform more ADLs without symptoms or delay the onset of symptoms       Core Components/Risk Factors/Patient Goals Review:    Core Components/Risk Factors/Patient Goals at Discharge (Final Review):    ITP Comments:   Comments:

## 2017-12-02 ENCOUNTER — Ambulatory Visit (INDEPENDENT_AMBULATORY_CARE_PROVIDER_SITE_OTHER): Payer: Commercial Managed Care - PPO | Admitting: *Deleted

## 2017-12-02 ENCOUNTER — Ambulatory Visit: Payer: Commercial Managed Care - PPO | Admitting: Internal Medicine

## 2017-12-02 DIAGNOSIS — I480 Paroxysmal atrial fibrillation: Secondary | ICD-10-CM | POA: Diagnosis not present

## 2017-12-03 ENCOUNTER — Encounter (HOSPITAL_COMMUNITY)
Admission: RE | Admit: 2017-12-03 | Discharge: 2017-12-03 | Disposition: A | Discharge status: Home / Self Care | Payer: Commercial Managed Care - PPO | Source: Ambulatory Visit | Attending: Internal Medicine | Admitting: Internal Medicine

## 2017-12-03 VITALS — Wt 206.8 lb

## 2017-12-03 DIAGNOSIS — J449 Chronic obstructive pulmonary disease, unspecified: Secondary | ICD-10-CM | POA: Diagnosis present

## 2017-12-03 NOTE — Progress Notes (Signed)
Daily Session Note  Patient Details  Name: Troy Enslin [name] MRN: [id number] Date of Birth: [birth year] Referring Provider:     Pulmonary Rehab Walk Test from 11/26/2017 in [place]  Referring Provider  Dr. Melvyn Novas      Encounter Date: 12/03/2017  Check In: Session Check In - 12/03/17 0930      Check-In   Location  [facility]    Staff Present  Rosebud Poles, RN, BSN;Molly diVincenzo, MS, ACSM RCEP, Exercise Physiologist    Supervising physician immediately available to respond to emergencies  [facility] immediately available    Physician(s)  Dr. Eliseo Squires    Medication changes reported      No    Fall or balance concerns reported     No    Tobacco Cessation  No Change    Warm-up and Cool-down  Performed as group-led instruction    Resistance Training Performed  Yes    VAD Patient?  No      Pain Assessment   Currently in Pain?  No/denies    Multiple Pain Sites  No       Capillary Blood Glucose: No results found for this or any previous visit (from the past 24 hour(s)).  Exercise Prescription Changes - 12/03/17 1100      Response to Exercise   Blood Pressure (Admit)  146/80    Blood Pressure (Exercise)  158/60    Blood Pressure (Exit)  130/58    Heart Rate (Admit)  54 bpm    Heart Rate (Exercise)  64 bpm    Heart Rate (Exit)  48 bpm    Oxygen Saturation (Admit)  97 %    Oxygen Saturation (Exercise)  95 %    Oxygen Saturation (Exit)  98 %    Rating of Perceived Exertion (Exercise)  13    Perceived Dyspnea (Exercise)  2    Duration  Progress to 45 minutes of aerobic exercise without signs/symptoms of physical distress    Intensity  Other (comment)      Progression   Progression  -- 40-80% HRR      Resistance Training   Training Prescription  Yes    Weight  BLUE BANDS    Reps  10-15    Time  10 Minutes      Bike   Level  0.6    Minutes  17      NuStep   Level  2    SPM  80    Minutes  17    METs  1.6      Track   Laps  10    Minutes  17       Social History   Tobacco Use  Smoking Status Former Smoker  . Packs/day: 0.50  . Years: 50.00  . Pack years: 25.00  . Types: Cigarettes  . Last attempt to quit: 07/29/2011  . Years since quitting: 6.3  Smokeless Tobacco Never Used    Goals Met:  Exercise tolerated well Strength training completed today  Goals Unmet:  Not Applicable  Comments: Service time is from 0930 to 1045.    Dr. Rush Farmer is Medical Director for Pulmonary Rehab at [facility].

## 2017-12-03 NOTE — Progress Notes (Signed)
Carelink Summary Report / Loop Recorder 

## 2017-12-05 ENCOUNTER — Encounter (HOSPITAL_COMMUNITY)
Admission: RE | Admit: 2017-12-05 | Discharge: 2017-12-05 | Disposition: A | Discharge status: Home / Self Care | Payer: Commercial Managed Care - PPO | Source: Ambulatory Visit | Attending: Internal Medicine | Admitting: Internal Medicine

## 2017-12-05 DIAGNOSIS — J449 Chronic obstructive pulmonary disease, unspecified: Secondary | ICD-10-CM

## 2017-12-05 NOTE — Progress Notes (Signed)
Daily Session Note  Patient Details  Name: Troy [name]. MRN: [id number] Date of Birth: July 13, 1941 Referring Provider:     Pulmonary Rehab Walk Test from 11/26/2017 in [place]  Referring Provider  Dr. Melvyn Novas      Encounter Date: 12/05/2017  Check In: Session Check In - 12/05/17 0930      Check-In   Location  [facility]    Staff Present  Rosebud Poles, RN, BSN;Molly diVincenzo, MS, ACSM RCEP, Exercise Physiologist    Supervising physician immediately available to respond to emergencies  [facility] immediately available    Physician(s)  Dr. Horris Latino    Medication changes reported      No    Fall or balance concerns reported     No    Tobacco Cessation  No Change    Warm-up and Cool-down  Performed as group-led instruction    Resistance Training Performed  Yes    VAD Patient?  No      Pain Assessment   Currently in Pain?  No/denies    Multiple Pain Sites  No       Capillary Blood Glucose: No results found for this or any previous visit (from the past 24 hour(s)).    Social History   Tobacco Use  Smoking Status Former Smoker  . Packs/day: 0.50  . Years: 50.00  . Pack years: 25.00  . Types: Cigarettes  . Last attempt to quit: 07/29/2011  . Years since quitting: 6.3  Smokeless Tobacco Never Used    Goals Met:  Exercise tolerated well Strength training completed today  Goals Unmet:  Not Applicable  Comments: Service time is from 0930 to 1100.    Dr. Rush Farmer is Medical Director for Pulmonary Rehab at [facility].

## 2017-12-09 ENCOUNTER — Other Ambulatory Visit: Payer: Self-pay | Admitting: Internal Medicine

## 2017-12-09 NOTE — Progress Notes (Deleted)
Pulmonary Individual Treatment Plan  Patient Details  Name: Troy Maret [name] MRN: [id number] Date of Birth: [birth year] Referring Provider:     Pulmonary Rehab Walk Test from 11/26/2017 in [place]  Referring Provider  Dr. Melvyn Novas      Initial Encounter Date:    Pulmonary Rehab Walk Test from 11/26/2017 in [place]  Date  11/28/17  Referring Provider  Dr. Melvyn Novas      Visit Diagnosis: No diagnosis found.  Patient's Home Medications on Admission:   Current Outpatient Medications:  .  acetaminophen (TYLENOL) 325 MG tablet, Take 650 mg every 6 (six) hours as needed by mouth for moderate pain or headache., Disp: , Rfl:  .  amiodarone (PACERONE) 200 MG tablet, TAKE 1/2 TABLET EVERY DAY., Disp: 15 tablet, Rfl: 2 .  bimatoprost (LUMIGAN) 0.01 % SOLN, Place 1 drop at bedtime into both eyes. , Disp: , Rfl:  .  Biotin 10000 MCG TABS, Take 10,000 mcg daily by mouth. , Disp: , Rfl:  .  CARDURA 4 MG tablet, TAKE (1/2) TABLET DAILY., Disp: 15 tablet, Rfl: 5 .  diltiazem (CARDIZEM CD) 120 MG 24 hr capsule, TAKE 1 CAPSULE DAILY. (Patient taking differently: Take 120 mg by mouth daily), Disp: 90 capsule, Rfl: 3 .  dorzolamide-timolol (COSOPT) 22.3-6.8 MG/ML ophthalmic solution, Place 2 drops 2 (two) times daily into the right eye. , Disp: , Rfl:  .  ELIQUIS 5 MG TABS tablet, TAKE 1 TABLET BY MOUTH TWICE DAILY., Disp: 60 tablet, Rfl: 3 .  FLUoxetine (PROZAC) 20 MG capsule, TAKE (1) CAPSULE DAILY., Disp: 30 capsule, Rfl: 2 .  furosemide (LASIX) 40 MG tablet, TAKE 1/2 TABLET (20MG) DAILY ALTERNATING WITH 1 TABLET (40MG)., Disp: 90 tablet, Rfl: 3 .  iron polysaccharides (POLY-IRON 150) 150 MG capsule, Take 150 mg by mouth daily., Disp: , Rfl:  .  Melatonin 5 MG TABS, Take 5 mg by mouth at bedtime., Disp: , Rfl:  .  Multiple Vitamin (MULTIVITAMIN WITH MINERALS) TABS tablet, Take 1 tablet by mouth daily. Centrum Silver, Disp: , Rfl:  .  Polyethyl  Glycol-Propyl Glycol (SYSTANE OP), Place 1-2 drops as needed into both eyes (for dry eyes)., Disp: , Rfl:  .  potassium chloride SA (K-DUR,KLOR-CON) 20 MEQ tablet, TAKE 1 TABLET ONCE DAILY. (Patient taking differently: Take 20 MEQ by mouth once daily), Disp: 90 tablet, Rfl: 3 .  ranitidine (ZANTAC) 150 MG tablet, Take 150 mg by mouth daily as needed for heartburn. , Disp: , Rfl:  .  rosuvastatin (CRESTOR) 40 MG tablet, TAKE 1 TABLET ONCE DAILY., Disp: 30 tablet, Rfl: 6  Past Medical History: Past Medical History:  Diagnosis Date  . AAA (abdominal aortic aneurysm) (HCC) LAST ABD. [name]  JAN 2014  3.4CM   MONITORED BY DR [name]  . Arthritis   . Atrial fibrillation ([place])   . Benign essential HTN 01/19/2016  . Bilateral carotid artery stenosis DUPLEX 12-29-2012  BY DR [facility]   BILATERAL ICA STENOSIS 60-79%  . BPH (benign prostatic hypertrophy)   . Cancer ([place]) 01-25-12   LUNG  . Complication of anesthesia    had nausea after carotid artery surgery, states the medicine given to help the nausea, made it worse  . COPD (chronic obstructive pulmonary disease) ([place])   . Exertional dyspnea   . Frequency of urination   . GERD (gastroesophageal reflux disease)   . Glaucoma BOTH EYES   Dr Gershon Crane  . History of basal cell carcinoma  excision   . History of lung cancer APRIL 2012  SQUAMOUS CELL---- S/P RIGHT LOWER LOBECTOMY AT [facility] --  NO CHEMORADIATION---  NO RECURRENCE    ONCOLOGIST- DR Tressie Stalker  LOV IN [facility] 10-27-2012  . Hx of adenomatous colonic polyps 2005    X 2; 1 hyperplastic polyp; Dr Olevia Perches  . Hyperlipidemia   . Hypertension   . Impaired fasting glucose 2007   108; A1c5.4%  . Lesion of bladder   . Microhematuria   . Nocturia   . OSA (obstructive sleep apnea) 08/29/2015   Mild with AHI overall 6/hr but severe during REM sleep with AHI 30/hr with oxygen saturations < 88% for 5.5 minutes of total sleep time.    [name] PAD (peripheral artery disease) ([place]) ABI'S  JAN 2014  0.65 ON RIGHT ;  1.04  ON LEFT  . Peripheral vascular disease ([place]) S/P ANGIOPLASTY AND STENTING   FOLLOWED  BY DR [name]  . Spinal stenosis Sept. 2015  . Stroke [facility]) Jul 07, 2013   mini  TIA  . Urgency of urination     Tobacco Use: Social History   Tobacco Use  Smoking Status Former Smoker  . Packs/day: 0.50  . Years: 50.00  . Pack years: 25.00  . Types: Cigarettes  . Last attempt to quit: 07/29/2011  . Years since quitting: 6.3  Smokeless Tobacco Never Used    Labs: Recent Review Flowsheet Data    Labs for ITP Cardiac and Pulmonary Rehab Latest Ref Rng & Units 01/04/2016 11/27/2016 05/03/2017 10/25/2017 11/04/2017   Cholestrol 0 - 200 mg/dL 95(L) 113 - 105 -   LDLCALC 0 - 99 mg/dL 39 39 - 50 -   HDL >40 mg/dL 39(L) 46 - 35(L) -   Trlycerides <150 mg/dL 86 140 - 101 -   Hemoglobin A1c - - - 5.7 - 5.9   TCO2 0 - 100 mmol/L - - - - -      Capillary Blood Glucose: Lab Results  Component Value Date   GLUCAP 97 08/29/2015   GLUCAP 111 (H) 07/07/2013   GLUCAP 112 (H) 12/22/2010     Pulmonary Assessment Scores: Pulmonary Assessment Scores    Row Name 11/21/17 1209 11/28/17 0841       ADL UCSD   ADL Phase  Entry  Entry    SOB Score total  64  -      CAT Score   CAT Score  18 Entry  -      mMRC Score   mMRC Score  -  1       Pulmonary Function Assessment: Pulmonary Function Assessment - 11/18/17 1243      Pulmonary Function Tests   FEV1%  62 %    FEV1/FVC Ratio  64       Exercise Target Goals:    Exercise Program Goal: Individual exercise prescription set using results from initial 6 min walk test and THRR while considering  patient's activity barriers and safety.    Exercise Prescription Goal: Initial exercise prescription builds to 30-45 minutes a day of aerobic activity, 2-3 days per week.  Home exercise guidelines will be given to patient during program as part of exercise prescription that the participant will acknowledge.  Activity Barriers & Risk  Stratification: Activity Barriers & Cardiac Risk Stratification - 11/18/17 1247      Activity Barriers & Cardiac Risk Stratification   Activity Barriers  -- eyesight       6 Minute Walk: 6 Minute Walk  Row Name 11/28/17 0838         6 Minute Walk   Phase  Initial     Distance  1200 feet     Walk Time  6 minutes     # of Rest Breaks  0     MPH  2.27     METS  3.3     RPE  13     Perceived Dyspnea   3     Symptoms  Yes (comment)     Comments  8/10 calf pain     Resting HR  59 bpm     Resting BP  124/58     Resting Oxygen Saturation   94 %     Exercise Oxygen Saturation  during 6 min walk  92 %     Max Ex. HR  117 bpm     Max Ex. BP  146/60       Interval HR   1 Minute HR  98     2 Minute HR  98     3 Minute HR  96     4 Minute HR  117     5 Minute HR  117     6 Minute HR  70     2 Minute Post HR  59     Interval Heart Rate?  Yes       Interval Oxygen   Interval Oxygen?  Yes     Baseline Oxygen Saturation %  94 %     1 Minute Oxygen Saturation %  94 %     1 Minute Liters of Oxygen  0 L     2 Minute Oxygen Saturation %  93 %     2 Minute Liters of Oxygen  0 L     3 Minute Oxygen Saturation %  92 %     3 Minute Liters of Oxygen  0 L     4 Minute Oxygen Saturation %  93 %     4 Minute Liters of Oxygen  0 L     5 Minute Oxygen Saturation %  92 %     5 Minute Liters of Oxygen  0 L     6 Minute Oxygen Saturation %  94 %     6 Minute Liters of Oxygen  0 L     2 Minute Post Oxygen Saturation %  97 %     2 Minute Post Liters of Oxygen  0 L        Oxygen Initial Assessment: Oxygen Initial Assessment - 12/09/17 0736      Home Oxygen   Home Oxygen Device  None    Sleep Oxygen Prescription  CPAP    Home Exercise Oxygen Prescription  None    Home at Rest Exercise Oxygen Prescription  None    Compliance with Home Oxygen Use  Yes      Initial 6 min Walk   Oxygen Used  None      Program Oxygen Prescription   Program Oxygen Prescription  None       Oxygen  Re-Evaluation:   Oxygen Discharge (Final Oxygen Re-Evaluation):   Initial Exercise Prescription: Initial Exercise Prescription - 11/28/17 0800      Date of Initial Exercise RX and Referring Provider   Date  11/28/17    Referring Provider  Dr. Melvyn Novas      Bike   Level  0.6    Minutes  17  NuStep   Level  2    SPM  80    Minutes  17    METs  1.5      Track   Laps  10    Minutes  17      Prescription Details   Frequency (times per week)  2    Duration  Progress to 45 minutes of aerobic exercise without signs/symptoms of physical distress      Intensity   THRR 40-80% of Max Heartrate  57-114    Ratings of Perceived Exertion  11-13    Perceived Dyspnea  0-4      Progression   Progression  Continue progressive overload as per policy without signs/symptoms or physical distress.      Resistance Training   Training Prescription  Yes    Weight  BLUE BANDS    Reps  10-15       Perform Capillary Blood Glucose checks as needed.  Exercise Prescription Changes: Exercise Prescription Changes    Row Name 12/03/17 1100             Response to Exercise   Blood Pressure (Admit)  146/80       Blood Pressure (Exercise)  158/60       Blood Pressure (Exit)  130/58       Heart Rate (Admit)  54 bpm       Heart Rate (Exercise)  64 bpm       Heart Rate (Exit)  48 bpm       Oxygen Saturation (Admit)  97 %       Oxygen Saturation (Exercise)  95 %       Oxygen Saturation (Exit)  98 %       Rating of Perceived Exertion (Exercise)  13       Perceived Dyspnea (Exercise)  2       Duration  Progress to 45 minutes of aerobic exercise without signs/symptoms of physical distress       Intensity  Other (comment)         Progression   Progression  - 40-80% HRR         Resistance Training   Training Prescription  Yes       Weight  BLUE BANDS       Reps  10-15       Time  10 Minutes         Bike   Level  0.6       Minutes  17         NuStep   Level  2       SPM  80        Minutes  17       METs  1.6         Track   Laps  10       Minutes  17          Exercise Comments:   Exercise Goals and Review:   Exercise Goals Re-Evaluation : Exercise Goals Re-Evaluation    Row Name 12/09/17 0736             Exercise Goal Re-Evaluation   Exercise Goals Review  Increase Strength and Stamina;Able to understand and use Dyspnea scale;Increase Physical Activity;Able to understand and use rate of perceived exertion (RPE) scale;Knowledge and understanding of Target Heart Rate Range (THRR);Understanding of Exercise Prescription       Comments  Patient has only attended two rehab sessions. Will  cont. to monitor and motivate as able.       Expected Outcomes  Through exercise at rehab, patient will increase strength and stamina and gain enough confidence to start an exercise regime at home.           Discharge Exercise Prescription (Final Exercise Prescription Changes): Exercise Prescription Changes - 12/03/17 1100      Response to Exercise   Blood Pressure (Admit)  146/80    Blood Pressure (Exercise)  158/60    Blood Pressure (Exit)  130/58    Heart Rate (Admit)  54 bpm    Heart Rate (Exercise)  64 bpm    Heart Rate (Exit)  48 bpm    Oxygen Saturation (Admit)  97 %    Oxygen Saturation (Exercise)  95 %    Oxygen Saturation (Exit)  98 %    Rating of Perceived Exertion (Exercise)  13    Perceived Dyspnea (Exercise)  2    Duration  Progress to 45 minutes of aerobic exercise without signs/symptoms of physical distress    Intensity  Other (comment)      Progression   Progression  -- 40-80% HRR      Resistance Training   Training Prescription  Yes    Weight  BLUE BANDS    Reps  10-15    Time  10 Minutes      Bike   Level  0.6    Minutes  17      NuStep   Level  2    SPM  80    Minutes  17    METs  1.6      Track   Laps  10    Minutes  17       Nutrition:  Target Goals: Understanding of nutrition guidelines, daily intake of sodium <1561m,  cholesterol <206m calories 30% from fat and 7% or less from saturated fats, daily to have 5 or more servings of fruits and vegetables.  Biometrics: Pre Biometrics - 11/18/17 1248      Pre Biometrics   Grip Strength  28 kg        Nutrition Therapy Plan and Nutrition Goals:   Nutrition Assessments:   Nutrition Goals Re-Evaluation:   Nutrition Goals Discharge (Final Nutrition Goals Re-Evaluation):   Psychosocial: Target Goals: Acknowledge presence or absence of significant depression and/or stress, maximize coping skills, provide positive support system. Participant is able to verbalize types and ability to use techniques and skills needed for reducing stress and depression.  Initial Review & Psychosocial Screening: Initial Psych Review & Screening - 11/18/17 1253      Initial Review   Current issues with  None Identified      Family Dynamics   Good Support System?  Yes      Barriers   Psychosocial barriers to participate in program  There are no identifiable barriers or psychosocial needs.      Screening Interventions   Interventions  Encouraged to exercise       Quality of Life Scores:  Scores of 19 and below usually indicate a poorer quality of life in these areas.  A difference of  2-3 points is a clinically meaningful difference.  A difference of 2-3 points in the total score of the Quality of Life Index has been associated with significant improvement in overall quality of life, self-image, physical symptoms, and general health in studies assessing change in quality of life.   PHQ-9: Recent Review Flowsheet Data  Depression screen [facility] 2/9 11/18/2017 11/04/2017 01/12/2015   Decreased Interest 0 0 0   Down, Depressed, Hopeless 0 0 0   PHQ - 2 Score 0 0 0     Interpretation of Total Score  Total Score Depression Severity:  1-4 = Minimal depression, 5-9 = Mild depression, 10-14 = Moderate depression, 15-19 = Moderately severe depression, 20-27 = Severe  depression   Psychosocial Evaluation and Intervention: Psychosocial Evaluation - 12/03/17 1237      Psychosocial Evaluation & Interventions   Interventions  Encouraged to exercise with the program and follow exercise prescription    Comments  no psychosocial issues identified    Expected Outcomes  no barriers to participation in pulmonary rehab    Continue Psychosocial Services   No Follow up required       Psychosocial Re-Evaluation: Psychosocial Re-Evaluation    [place] Name 12/03/17 1238             Psychosocial Re-Evaluation   Current issues with  None Identified       Expected Outcomes  no barriers to participation in pulmonary rehab       Interventions  Encouraged to attend Pulmonary Rehabilitation for the exercise       Continue Psychosocial Services   No Follow up required          Psychosocial Discharge (Final Psychosocial Re-Evaluation): Psychosocial Re-Evaluation - 12/03/17 1238      Psychosocial Re-Evaluation   Current issues with  None Identified    Expected Outcomes  no barriers to participation in pulmonary rehab    Interventions  Encouraged to attend Pulmonary Rehabilitation for the exercise    Continue Psychosocial Services   No Follow up required       Education: Education Goals: Education classes will be provided on a weekly basis, covering required topics. Participant will state understanding/return demonstration of topics presented.  Learning Barriers/Preferences: Learning Barriers/Preferences - 11/18/17 1241      Learning Barriers/Preferences   Learning Barriers  Sight    Learning Preferences  Individual Instruction;Skilled Demonstration;Verbal Instruction calls family with questions       Education Topics: Risk Factor Reduction:  -Group instruction that is supported by a PowerPoint presentation. Instructor discusses the definition of a risk factor, different risk factors for pulmonary disease, and how the heart and lungs work together.      Nutrition for Pulmonary Patient:  -Group instruction provided by PowerPoint slides, verbal discussion, and written materials to support subject matter. The instructor gives an explanation and review of healthy diet recommendations, which includes a discussion on weight management, recommendations for fruit and vegetable consumption, as well as protein, fluid, caffeine, fiber, sodium, sugar, and alcohol. Tips for eating when patients are short of breath are discussed.   Pursed Lip Breathing:  -Group instruction that is supported by demonstration and informational handouts. Instructor discusses the benefits of pursed lip and diaphragmatic breathing and detailed demonstration on how to preform both.     Oxygen Safety:  -Group instruction provided by PowerPoint, verbal discussion, and written material to support subject matter. There is an overview of "What is Oxygen" and "Why do we need it".  Instructor also reviews how to create a safe environment for oxygen use, the importance of using oxygen as prescribed, and the risks of noncompliance. There is a brief discussion on traveling with oxygen and resources the patient may utilize.   Oxygen Equipment:  -Group instruction provided by [facility] Staff utilizing handouts, written materials, and equipment demonstrations.  Signs and Symptoms:  -Group instruction provided by written material and verbal discussion to support subject matter. Warning signs and symptoms of infection, stroke, and heart attack are reviewed and when to call the physician/911 reinforced. Tips for preventing the spread of infection discussed.   Advanced Directives:  -Group instruction provided by verbal instruction and written material to support subject matter. Instructor reviews Advanced Directive laws and proper instruction for filling out document.   Pulmonary Video:  -Group video education that reviews the importance of medication and oxygen compliance, exercise,  good nutrition, pulmonary hygiene, and pursed lip and diaphragmatic breathing for the pulmonary patient.   Exercise for the Pulmonary Patient:  -Group instruction that is supported by a PowerPoint presentation. Instructor discusses benefits of exercise, core components of exercise, frequency, duration, and intensity of an exercise routine, importance of utilizing pulse oximetry during exercise, safety while exercising, and options of places to exercise outside of rehab.     Pulmonary Medications:  -Verbally interactive group education provided by instructor with focus on inhaled medications and proper administration.   Anatomy and Physiology of the Respiratory System and Intimacy:  -Group instruction provided by PowerPoint, verbal discussion, and written material to support subject matter. Instructor reviews respiratory cycle and anatomical components of the respiratory system and their functions. Instructor also reviews differences in obstructive and restrictive respiratory diseases with examples of each. Intimacy, Sex, and Sexuality differences are reviewed with a discussion on how relationships can change when diagnosed with pulmonary disease. Common sexual concerns are reviewed.   MD DAY -A group question and answer session with a medical doctor that allows participants to ask questions that relate to their pulmonary disease state.   OTHER EDUCATION -Group or individual verbal, written, or video instructions that support the educational goals of the pulmonary rehab program.   Holiday Eating Survival Tips:  -Group instruction provided by PowerPoint slides, verbal discussion, and written materials to support subject matter. The instructor gives patients tips, tricks, and techniques to help them not only survive but enjoy the holidays despite the onslaught of food that accompanies the holidays.   Knowledge Questionnaire Score: Knowledge Questionnaire Score - 11/21/17 1209       Knowledge Questionnaire Score   Pre Score  11/18       Core Components/Risk Factors/Patient Goals at Admission: Personal Goals and Risk Factors at Admission - 11/18/17 1251      Core Components/Risk Factors/Patient Goals on Admission   Improve shortness of breath with ADL's  Yes    Intervention  Provide education, individualized exercise plan and daily activity instruction to help decrease symptoms of SOB with activities of daily living.    Expected Outcomes  Short Term: Improve cardiorespiratory fitness to achieve a reduction of symptoms when performing ADLs;Long Term: Be able to perform more ADLs without symptoms or delay the onset of symptoms       Core Components/Risk Factors/Patient Goals Review:  Goals and Risk Factor Review    Row Name 12/03/17 1235             Core Components/Risk Factors/Patient Goals Review   Personal Goals Review  Improve shortness of breath with ADL's       Review  Has just begun program, has exercised once in program, tolerated it well.  Too early to see progression toward goals       Expected Outcomes  see admission goals          Core Components/Risk Factors/Patient Goals at Discharge (Final Review):  Goals  and Risk Factor Review - 12/03/17 1235      Core Components/Risk Factors/Patient Goals Review   Personal Goals Review  Improve shortness of breath with ADL's    Review  Has just begun program, has exercised once in program, tolerated it well.  Too early to see progression toward goals    Expected Outcomes  see admission goals       ITP Comments:   Comments: patient has attended 2 sessions since admission

## 2017-12-09 NOTE — Progress Notes (Signed)
Pulmonary Individual Treatment Plan  Patient Details  Name: Troy Steidle [name] MRN: [id number] Date of Birth: [birth year] Referring Provider:     Pulmonary Rehab Walk Test from 11/26/2017 in [place]  Referring Provider  Dr. Melvyn Novas      Initial Encounter Date:    Pulmonary Rehab Walk Test from 11/26/2017 in [place]  Date  11/28/17  Referring Provider  Dr. Melvyn Novas      Visit Diagnosis: Chronic obstructive pulmonary disease, unspecified COPD type ([place])  Patient's Home Medications on Admission:   Current Outpatient Medications:  .  acetaminophen (TYLENOL) 325 MG tablet, Take 650 mg every 6 (six) hours as needed by mouth for moderate pain or headache., Disp: , Rfl:  .  amiodarone (PACERONE) 200 MG tablet, TAKE 1/2 TABLET EVERY DAY., Disp: 15 tablet, Rfl: 2 .  bimatoprost (LUMIGAN) 0.01 % SOLN, Place 1 drop at bedtime into both eyes. , Disp: , Rfl:  .  Biotin 10000 MCG TABS, Take 10,000 mcg daily by mouth. , Disp: , Rfl:  .  CARDURA 4 MG tablet, TAKE (1/2) TABLET DAILY., Disp: 15 tablet, Rfl: 5 .  diltiazem (CARDIZEM CD) 120 MG 24 hr capsule, TAKE 1 CAPSULE DAILY. (Patient taking differently: Take 120 mg by mouth daily), Disp: 90 capsule, Rfl: 3 .  dorzolamide-timolol (COSOPT) 22.3-6.8 MG/ML ophthalmic solution, Place 2 drops 2 (two) times daily into the right eye. , Disp: , Rfl:  .  ELIQUIS 5 MG TABS tablet, TAKE 1 TABLET BY MOUTH TWICE DAILY., Disp: 60 tablet, Rfl: 3 .  FLUoxetine (PROZAC) 20 MG capsule, TAKE (1) CAPSULE DAILY., Disp: 30 capsule, Rfl: 2 .  furosemide (LASIX) 40 MG tablet, TAKE 1/2 TABLET ('20MG'$ ) DAILY ALTERNATING WITH 1 TABLET ('40MG'$ )., Disp: 90 tablet, Rfl: 3 .  iron polysaccharides (POLY-IRON 150) 150 MG capsule, Take 150 mg by mouth daily., Disp: , Rfl:  .  Melatonin 5 MG TABS, Take 5 mg by mouth at bedtime., Disp: , Rfl:  .  Multiple Vitamin (MULTIVITAMIN WITH MINERALS) TABS tablet, Take 1 tablet by mouth  daily. Centrum Silver, Disp: , Rfl:  .  Polyethyl Glycol-Propyl Glycol (SYSTANE OP), Place 1-2 drops as needed into both eyes (for dry eyes)., Disp: , Rfl:  .  potassium chloride SA (K-DUR,KLOR-CON) 20 MEQ tablet, TAKE 1 TABLET ONCE DAILY. (Patient taking differently: Take 20 MEQ by mouth once daily), Disp: 90 tablet, Rfl: 3 .  ranitidine (ZANTAC) 150 MG tablet, Take 150 mg by mouth daily as needed for heartburn. , Disp: , Rfl:  .  rosuvastatin (CRESTOR) 40 MG tablet, TAKE 1 TABLET ONCE DAILY., Disp: 30 tablet, Rfl: 6  Past Medical History: Past Medical History:  Diagnosis Date  . AAA (abdominal aortic aneurysm) (HCC) LAST ABD. [name]  JAN 2014  3.4CM   MONITORED BY DR [name]  . Arthritis   . Atrial fibrillation ([place])   . Benign essential HTN 01/19/2016  . Bilateral carotid artery stenosis DUPLEX 12-29-2012  BY DR [facility]   BILATERAL ICA STENOSIS 60-79%  . BPH (benign prostatic hypertrophy)   . Cancer ([place]) 01-25-12   LUNG  . Complication of anesthesia    had nausea after carotid artery surgery, states the medicine given to help the nausea, made it worse  . COPD (chronic obstructive pulmonary disease) ([place])   . Exertional dyspnea   . Frequency of urination   . GERD (gastroesophageal reflux disease)   . Glaucoma BOTH EYES   Dr Gershon Crane  .  History of basal cell carcinoma excision   . History of lung cancer APRIL 2012  SQUAMOUS CELL---- S/P RIGHT LOWER LOBECTOMY AT [facility] --  NO CHEMORADIATION---  NO RECURRENCE    ONCOLOGIST- DR Tressie Stalker  LOV IN [facility] 10-27-2012  . Hx of adenomatous colonic polyps 2005    X 2; 1 hyperplastic polyp; Dr Olevia Perches  . Hyperlipidemia   . Hypertension   . Impaired fasting glucose 2007   108; A1c5.4%  . Lesion of bladder   . Microhematuria   . Nocturia   . OSA (obstructive sleep apnea) 08/29/2015   Mild with AHI overall 6/hr but severe during REM sleep with AHI 30/hr with oxygen saturations < 88% for 5.5 minutes of total sleep time.    [name] PAD (peripheral artery  disease) ([place]) ABI'S  JAN 2014  0.65 ON RIGHT ;  1.04 ON LEFT  . Peripheral vascular disease ([place]) S/P ANGIOPLASTY AND STENTING   FOLLOWED  BY DR [name]  . Spinal stenosis Sept. 2015  . Stroke [facility]) Jul 07, 2013   mini  TIA  . Urgency of urination     Tobacco Use: Social History   Tobacco Use  Smoking Status Former Smoker  . Packs/day: 0.50  . Years: 50.00  . Pack years: 25.00  . Types: Cigarettes  . Last attempt to quit: 07/29/2011  . Years since quitting: 6.3  Smokeless Tobacco Never Used    Labs: Recent Review Flowsheet Data    Labs for ITP Cardiac and Pulmonary Rehab Latest Ref Rng & Units 01/04/2016 11/27/2016 05/03/2017 10/25/2017 11/04/2017   Cholestrol 0 - 200 mg/dL 95(L) 113 - 105 -   LDLCALC 0 - 99 mg/dL 39 39 - 50 -   HDL >40 mg/dL 39(L) 46 - 35(L) -   Trlycerides <150 mg/dL 86 140 - 101 -   Hemoglobin A1c - - - 5.7 - 5.9   TCO2 0 - 100 mmol/L - - - - -      Capillary Blood Glucose: Lab Results  Component Value Date   GLUCAP 97 08/29/2015   GLUCAP 111 (H) 07/07/2013   GLUCAP 112 (H) 12/22/2010     Pulmonary Assessment Scores: Pulmonary Assessment Scores    Row Name 11/21/17 1209 11/28/17 0841       ADL UCSD   ADL Phase  Entry  Entry    SOB Score total  64  -      CAT Score   CAT Score  18 Entry  -      mMRC Score   mMRC Score  -  1       Pulmonary Function Assessment: Pulmonary Function Assessment - 11/18/17 1243      Pulmonary Function Tests   FEV1%  62 %    FEV1/FVC Ratio  64       Exercise Target Goals:    Exercise Program Goal: Individual exercise prescription set using results from initial 6 min walk test and THRR while considering  patient's activity barriers and safety.    Exercise Prescription Goal: Initial exercise prescription builds to 30-45 minutes a day of aerobic activity, 2-3 days per week.  Home exercise guidelines will be given to patient during program as part of exercise prescription that the participant will  acknowledge.  Activity Barriers & Risk Stratification: Activity Barriers & Cardiac Risk Stratification - 11/18/17 1247      Activity Barriers & Cardiac Risk Stratification   Activity Barriers  -- eyesight       6 Minute  Walk: 6 Minute Walk    Row Name 11/28/17 0838         6 Minute Walk   Phase  Initial     Distance  1200 feet     Walk Time  6 minutes     # of Rest Breaks  0     MPH  2.27     METS  3.3     RPE  13     Perceived Dyspnea   3     Symptoms  Yes (comment)     Comments  8/10 calf pain     Resting HR  59 bpm     Resting BP  124/58     Resting Oxygen Saturation   94 %     Exercise Oxygen Saturation  during 6 min walk  92 %     Max Ex. HR  117 bpm     Max Ex. BP  146/60       Interval HR   1 Minute HR  98     2 Minute HR  98     3 Minute HR  96     4 Minute HR  117     5 Minute HR  117     6 Minute HR  70     2 Minute Post HR  59     Interval Heart Rate?  Yes       Interval Oxygen   Interval Oxygen?  Yes     Baseline Oxygen Saturation %  94 %     1 Minute Oxygen Saturation %  94 %     1 Minute Liters of Oxygen  0 L     2 Minute Oxygen Saturation %  93 %     2 Minute Liters of Oxygen  0 L     3 Minute Oxygen Saturation %  92 %     3 Minute Liters of Oxygen  0 L     4 Minute Oxygen Saturation %  93 %     4 Minute Liters of Oxygen  0 L     5 Minute Oxygen Saturation %  92 %     5 Minute Liters of Oxygen  0 L     6 Minute Oxygen Saturation %  94 %     6 Minute Liters of Oxygen  0 L     2 Minute Post Oxygen Saturation %  97 %     2 Minute Post Liters of Oxygen  0 L        Oxygen Initial Assessment: Oxygen Initial Assessment - 12/09/17 0736      Home Oxygen   Home Oxygen Device  None    Sleep Oxygen Prescription  CPAP    Home Exercise Oxygen Prescription  None    Home at Rest Exercise Oxygen Prescription  None    Compliance with Home Oxygen Use  Yes      Initial 6 min Walk   Oxygen Used  None      Program Oxygen Prescription   Program  Oxygen Prescription  None       Oxygen Re-Evaluation:   Oxygen Discharge (Final Oxygen Re-Evaluation):   Initial Exercise Prescription: Initial Exercise Prescription - 11/28/17 0800      Date of Initial Exercise RX and Referring Provider   Date  11/28/17    Referring Provider  Dr. Melvyn Novas      Bike   Level  0.6  Minutes  17      NuStep   Level  2    SPM  80    Minutes  17    METs  1.5      Track   Laps  10    Minutes  17      Prescription Details   Frequency (times per week)  2    Duration  Progress to 45 minutes of aerobic exercise without signs/symptoms of physical distress      Intensity   THRR 40-80% of Max Heartrate  57-114    Ratings of Perceived Exertion  11-13    Perceived Dyspnea  0-4      Progression   Progression  Continue progressive overload as per policy without signs/symptoms or physical distress.      Resistance Training   Training Prescription  Yes    Weight  BLUE BANDS    Reps  10-15       Perform Capillary Blood Glucose checks as needed.  Exercise Prescription Changes: Exercise Prescription Changes    Row Name 12/03/17 1100             Response to Exercise   Blood Pressure (Admit)  146/80       Blood Pressure (Exercise)  158/60       Blood Pressure (Exit)  130/58       Heart Rate (Admit)  54 bpm       Heart Rate (Exercise)  64 bpm       Heart Rate (Exit)  48 bpm       Oxygen Saturation (Admit)  97 %       Oxygen Saturation (Exercise)  95 %       Oxygen Saturation (Exit)  98 %       Rating of Perceived Exertion (Exercise)  13       Perceived Dyspnea (Exercise)  2       Duration  Progress to 45 minutes of aerobic exercise without signs/symptoms of physical distress       Intensity  Other (comment)         Progression   Progression  - 40-80% HRR         Resistance Training   Training Prescription  Yes       Weight  BLUE BANDS       Reps  10-15       Time  10 Minutes         Bike   Level  0.6       Minutes  17          NuStep   Level  2       SPM  80       Minutes  17       METs  1.6         Track   Laps  10       Minutes  17          Exercise Comments:   Exercise Goals and Review:   Exercise Goals Re-Evaluation : Exercise Goals Re-Evaluation    Row Name 12/09/17 0736             Exercise Goal Re-Evaluation   Exercise Goals Review  Increase Strength and Stamina;Able to understand and use Dyspnea scale;Increase Physical Activity;Able to understand and use rate of perceived exertion (RPE) scale;Knowledge and understanding of Target Heart Rate Range (THRR);Understanding of Exercise Prescription       Comments  Patient has only attended two rehab sessions. Will cont. to monitor and motivate as able.       Expected Outcomes  Through exercise at rehab, patient will increase strength and stamina and gain enough confidence to start an exercise regime at home.           Discharge Exercise Prescription (Final Exercise Prescription Changes): Exercise Prescription Changes - 12/03/17 1100      Response to Exercise   Blood Pressure (Admit)  146/80    Blood Pressure (Exercise)  158/60    Blood Pressure (Exit)  130/58    Heart Rate (Admit)  54 bpm    Heart Rate (Exercise)  64 bpm    Heart Rate (Exit)  48 bpm    Oxygen Saturation (Admit)  97 %    Oxygen Saturation (Exercise)  95 %    Oxygen Saturation (Exit)  98 %    Rating of Perceived Exertion (Exercise)  13    Perceived Dyspnea (Exercise)  2    Duration  Progress to 45 minutes of aerobic exercise without signs/symptoms of physical distress    Intensity  Other (comment)      Progression   Progression  -- 40-80% HRR      Resistance Training   Training Prescription  Yes    Weight  BLUE BANDS    Reps  10-15    Time  10 Minutes      Bike   Level  0.6    Minutes  17      NuStep   Level  2    SPM  80    Minutes  17    METs  1.6      Track   Laps  10    Minutes  17       Nutrition:  Target Goals: Understanding of nutrition  guidelines, daily intake of sodium '1500mg'$ , cholesterol '200mg'$ , calories 30% from fat and 7% or less from saturated fats, daily to have 5 or more servings of fruits and vegetables.  Biometrics: Pre Biometrics - 11/18/17 1248      Pre Biometrics   Grip Strength  28 kg        Nutrition Therapy Plan and Nutrition Goals:   Nutrition Assessments:   Nutrition Goals Re-Evaluation:   Nutrition Goals Discharge (Final Nutrition Goals Re-Evaluation):   Psychosocial: Target Goals: Acknowledge presence or absence of significant depression and/or stress, maximize coping skills, provide positive support system. Participant is able to verbalize types and ability to use techniques and skills needed for reducing stress and depression.  Initial Review & Psychosocial Screening: Initial Psych Review & Screening - 11/18/17 1253      Initial Review   Current issues with  None Identified      Family Dynamics   Good Support System?  Yes      Barriers   Psychosocial barriers to participate in program  There are no identifiable barriers or psychosocial needs.      Screening Interventions   Interventions  Encouraged to exercise       Quality of Life Scores:  Scores of 19 and below usually indicate a poorer quality of life in these areas.  A difference of  2-3 points is a clinically meaningful difference.  A difference of 2-3 points in the total score of the Quality of Life Index has been associated with significant improvement in overall quality of life, self-image, physical symptoms, and general health in studies assessing change in quality of life.  PHQ-9: Recent Review Flowsheet Data    Depression screen [facility] 2/9 11/18/2017 11/04/2017 01/12/2015   Decreased Interest 0 0 0   Down, Depressed, Hopeless 0 0 0   PHQ - 2 Score 0 0 0     Interpretation of Total Score  Total Score Depression Severity:  1-4 = Minimal depression, 5-9 = Mild depression, 10-14 = Moderate depression, 15-19 =  Moderately severe depression, 20-27 = Severe depression   Psychosocial Evaluation and Intervention: Psychosocial Evaluation - 12/03/17 1237      Psychosocial Evaluation & Interventions   Interventions  Encouraged to exercise with the program and follow exercise prescription    Comments  no psychosocial issues identified    Expected Outcomes  no barriers to participation in pulmonary rehab    Continue Psychosocial Services   No Follow up required       Psychosocial Re-Evaluation: Psychosocial Re-Evaluation    [place] Name 12/03/17 1238             Psychosocial Re-Evaluation   Current issues with  None Identified       Expected Outcomes  no barriers to participation in pulmonary rehab       Interventions  Encouraged to attend Pulmonary Rehabilitation for the exercise       Continue Psychosocial Services   No Follow up required          Psychosocial Discharge (Final Psychosocial Re-Evaluation): Psychosocial Re-Evaluation - 12/03/17 1238      Psychosocial Re-Evaluation   Current issues with  None Identified    Expected Outcomes  no barriers to participation in pulmonary rehab    Interventions  Encouraged to attend Pulmonary Rehabilitation for the exercise    Continue Psychosocial Services   No Follow up required       Education: Education Goals: Education classes will be provided on a weekly basis, covering required topics. Participant will state understanding/return demonstration of topics presented.  Learning Barriers/Preferences: Learning Barriers/Preferences - 11/18/17 1241      Learning Barriers/Preferences   Learning Barriers  Sight    Learning Preferences  Individual Instruction;Skilled Demonstration;Verbal Instruction calls family with questions       Education Topics: Risk Factor Reduction:  -Group instruction that is supported by a PowerPoint presentation. Instructor discusses the definition of a risk factor, different risk factors for pulmonary disease, and  how the heart and lungs work together.     Nutrition for Pulmonary Patient:  -Group instruction provided by PowerPoint slides, verbal discussion, and written materials to support subject matter. The instructor gives an explanation and review of healthy diet recommendations, which includes a discussion on weight management, recommendations for fruit and vegetable consumption, as well as protein, fluid, caffeine, fiber, sodium, sugar, and alcohol. Tips for eating when patients are short of breath are discussed.   Pursed Lip Breathing:  -Group instruction that is supported by demonstration and informational handouts. Instructor discusses the benefits of pursed lip and diaphragmatic breathing and detailed demonstration on how to preform both.     Oxygen Safety:  -Group instruction provided by PowerPoint, verbal discussion, and written material to support subject matter. There is an overview of "What is Oxygen" and "Why do we need it".  Instructor also reviews how to create a safe environment for oxygen use, the importance of using oxygen as prescribed, and the risks of noncompliance. There is a brief discussion on traveling with oxygen and resources the patient may utilize.   Oxygen Equipment:  -Group instruction provided by [facility] Staff  utilizing handouts, written materials, and equipment demonstrations.   Signs and Symptoms:  -Group instruction provided by written material and verbal discussion to support subject matter. Warning signs and symptoms of infection, stroke, and heart attack are reviewed and when to call the physician/911 reinforced. Tips for preventing the spread of infection discussed.   Advanced Directives:  -Group instruction provided by verbal instruction and written material to support subject matter. Instructor reviews Advanced Directive laws and proper instruction for filling out document.   Pulmonary Video:  -Group video education that reviews the importance of  medication and oxygen compliance, exercise, good nutrition, pulmonary hygiene, and pursed lip and diaphragmatic breathing for the pulmonary patient.   Exercise for the Pulmonary Patient:  -Group instruction that is supported by a PowerPoint presentation. Instructor discusses benefits of exercise, core components of exercise, frequency, duration, and intensity of an exercise routine, importance of utilizing pulse oximetry during exercise, safety while exercising, and options of places to exercise outside of rehab.     Pulmonary Medications:  -Verbally interactive group education provided by instructor with focus on inhaled medications and proper administration.   Anatomy and Physiology of the Respiratory System and Intimacy:  -Group instruction provided by PowerPoint, verbal discussion, and written material to support subject matter. Instructor reviews respiratory cycle and anatomical components of the respiratory system and their functions. Instructor also reviews differences in obstructive and restrictive respiratory diseases with examples of each. Intimacy, Sex, and Sexuality differences are reviewed with a discussion on how relationships can change when diagnosed with pulmonary disease. Common sexual concerns are reviewed.   MD DAY -A group question and answer session with a medical doctor that allows participants to ask questions that relate to their pulmonary disease state.   OTHER EDUCATION -Group or individual verbal, written, or video instructions that support the educational goals of the pulmonary rehab program.   Holiday Eating Survival Tips:  -Group instruction provided by PowerPoint slides, verbal discussion, and written materials to support subject matter. The instructor gives patients tips, tricks, and techniques to help them not only survive but enjoy the holidays despite the onslaught of food that accompanies the holidays.   Knowledge Questionnaire Score: Knowledge  Questionnaire Score - 11/21/17 1209      Knowledge Questionnaire Score   Pre Score  11/18       Core Components/Risk Factors/Patient Goals at Admission: Personal Goals and Risk Factors at Admission - 11/18/17 1251      Core Components/Risk Factors/Patient Goals on Admission   Improve shortness of breath with ADL's  Yes    Intervention  Provide education, individualized exercise plan and daily activity instruction to help decrease symptoms of SOB with activities of daily living.    Expected Outcomes  Short Term: Improve cardiorespiratory fitness to achieve a reduction of symptoms when performing ADLs;Long Term: Be able to perform more ADLs without symptoms or delay the onset of symptoms       Core Components/Risk Factors/Patient Goals Review:  Goals and Risk Factor Review    Row Name 12/03/17 1235             Core Components/Risk Factors/Patient Goals Review   Personal Goals Review  Improve shortness of breath with ADL's       Review  Has just begun program, has exercised once in program, tolerated it well.  Too early to see progression toward goals       Expected Outcomes  see admission goals          Core  Components/Risk Factors/Patient Goals at Discharge (Final Review):  Goals and Risk Factor Review - 12/03/17 1235      Core Components/Risk Factors/Patient Goals Review   Personal Goals Review  Improve shortness of breath with ADL's    Review  Has just begun program, has exercised once in program, tolerated it well.  Too early to see progression toward goals    Expected Outcomes  see admission goals       ITP Comments:   Comments: patient has attended 2 sessions since admission

## 2017-12-10 ENCOUNTER — Encounter (HOSPITAL_COMMUNITY)
Admission: RE | Admit: 2017-12-10 | Discharge: 2017-12-10 | Disposition: A | Discharge status: Home / Self Care | Payer: Commercial Managed Care - PPO | Source: Ambulatory Visit | Attending: Internal Medicine | Admitting: Internal Medicine

## 2017-12-10 ENCOUNTER — Encounter (HOSPITAL_COMMUNITY): Payer: Self-pay | Admitting: Cardiology

## 2017-12-10 DIAGNOSIS — J449 Chronic obstructive pulmonary disease, unspecified: Secondary | ICD-10-CM | POA: Diagnosis not present

## 2017-12-10 NOTE — Progress Notes (Addendum)
Daily Session Note  Patient Details  Name: Troy Reasoner [name] MRN: [id number] Date of Birth: 10-31-1940 Referring Provider:     Pulmonary Rehab Walk Test from 11/26/2017 in [place]  Referring Provider  Dr. Melvyn Novas      Encounter Date: 12/10/2017  Check In: Session Check In - 12/10/17 1233      Check-In   Location  [facility]    Staff Present  Su Hilt, MS, ACSM RCEP, Exercise Physiologist;Portia Rollene Rotunda, RN, Roque Cash, RN    Supervising physician immediately available to respond to emergencies  [facility] immediately available    Physician(s)  Dr. Horris Latino    Medication changes reported      No    Fall or balance concerns reported     No    Tobacco Cessation  No Change    Warm-up and Cool-down  Performed as group-led instruction    Resistance Training Performed  Yes    VAD Patient?  No      Pain Assessment   Currently in Pain?  No/denies    Multiple Pain Sites  No       Capillary Blood Glucose: No results found for this or any previous visit (from the past 24 hour(s)).    Social History   Tobacco Use  Smoking Status Former Smoker  . Packs/day: 0.50  . Years: 50.00  . Pack years: 25.00  . Types: Cigarettes  . Last attempt to quit: 07/29/2011  . Years since quitting: 6.3  Smokeless Tobacco Never Used    Goals Met:  Exercise tolerated well No report of cardiac concerns or symptoms Strength training completed today  Goals Unmet:  Not Applicable  Comments: Service time is from 0930 to 1100    Dr. Rush Farmer is Medical Director for Pulmonary Rehab at [facility].

## 2017-12-11 ENCOUNTER — Telehealth (HOSPITAL_COMMUNITY): Payer: Self-pay | Admitting: *Deleted

## 2017-12-11 NOTE — Telephone Encounter (Signed)
Portia with Pulm. Rehab called to inform Dr.McLean that the patients bp was not low during rehab yesterday his heart rate was in the high 40's at rest and in the mid 50's during exercise. Patient was asymptomatic during rehab and is scheduled for rehab again on Thursday. Portia and patient agreed to watch and if symptoms occurred or heart rate continued to trend down she would contact [place] for advice. Patient Per Dr.McLean no changes at this time and patient should continue with Cardiac rehab. After rehab yesterday patient told his wife that his bp was "very low". Patients wife became very concerned and contacted Pulmonary rehab and sent a mychart message. After speaking with Truddie Crumble patients wife was  contacted and informed that his bp was not low and his his heart rate was just lower than it normally is. Patients wife thanked our office for the phone call and said that patient "confused" bp and heart rate and that she felt better about the situation. Also informed her that Dr.McLean made no changes to current regimen/plan.

## 2017-12-11 NOTE — Telephone Encounter (Signed)
Received message below in mychart.  I called wife back this morning and asked her about patient's BP.  She was unable to give me the readings.  I looked at the pulmonary rehab note and BP listed was WNL.  I asked her to recheck it today when he wakes up.  She was also concerned about him not taking his medications correctly due to him having blindness and wont allow her to help him with his medications.   I talked with [name] and he agrees with trying the pill packs, I advised her to call her pharmacy and see if they provide this service, if not we can transfer her medications to a pharmacy that does. No further questions.

## 2017-12-11 NOTE — Telephone Encounter (Signed)
Entered in error

## 2017-12-12 ENCOUNTER — Encounter (HOSPITAL_COMMUNITY)
Admission: RE | Admit: 2017-12-12 | Discharge: 2017-12-12 | Disposition: A | Discharge status: Home / Self Care | Payer: Commercial Managed Care - PPO | Source: Ambulatory Visit | Attending: Internal Medicine | Admitting: Internal Medicine

## 2017-12-12 DIAGNOSIS — J449 Chronic obstructive pulmonary disease, unspecified: Secondary | ICD-10-CM | POA: Diagnosis not present

## 2017-12-12 NOTE — Progress Notes (Signed)
Daily Session Note  Patient Details  Name: Troy Misiaszek [name] MRN: [id number] Date of Birth: 1940/12/10 Referring Provider:     Pulmonary Rehab Walk Test from 11/26/2017 in [place]  Referring Provider  Dr. Melvyn Novas      Encounter Date: 12/12/2017  Check In: Session Check In - 12/12/17 0930      Check-In   Location  [facility]    Staff Present  Su Hilt, MS, ACSM RCEP, Exercise Physiologist;Portia Rollene Rotunda, RN, Roque Cash, RN    Supervising physician immediately available to respond to emergencies  [facility] immediately available    Physician(s)  Dr. Wendee Beavers    Medication changes reported      No    Fall or balance concerns reported     No    Tobacco Cessation  No Change    Warm-up and Cool-down  Performed as group-led instruction    Resistance Training Performed  Yes    VAD Patient?  No      Pain Assessment   Currently in Pain?  No/denies    Multiple Pain Sites  No       Capillary Blood Glucose: No results found for this or any previous visit (from the past 24 hour(s)).    Social History   Tobacco Use  Smoking Status Former Smoker  . Packs/day: 0.50  . Years: 50.00  . Pack years: 25.00  . Types: Cigarettes  . Last attempt to quit: 07/29/2011  . Years since quitting: 6.3  Smokeless Tobacco Never Used    Goals Met:  Exercise tolerated well No report of cardiac concerns or symptoms Strength training completed today  Goals Unmet:  Not Applicable  Comments: Service time is from 9:30a to 10:45a    Dr. Rush Farmer is Medical Director for Pulmonary Rehab at [facility].

## 2017-12-17 ENCOUNTER — Encounter (HOSPITAL_COMMUNITY)
Admission: RE | Admit: 2017-12-17 | Discharge: 2017-12-17 | Disposition: A | Discharge status: Home / Self Care | Payer: Commercial Managed Care - PPO | Source: Ambulatory Visit | Attending: Internal Medicine | Admitting: Internal Medicine

## 2017-12-17 DIAGNOSIS — J449 Chronic obstructive pulmonary disease, unspecified: Secondary | ICD-10-CM | POA: Diagnosis not present

## 2017-12-17 NOTE — Progress Notes (Signed)
Daily Session Note  Patient Details  Name: Troy Janik [name] MRN: [id number] Date of Birth: 1940/11/24 Referring Provider:     Pulmonary Rehab Walk Test from 11/26/2017 in [place]  Referring Provider  Dr. Melvyn Novas      Encounter Date: 12/17/2017  Check In: Session Check In - 12/17/17 0930      Check-In   Location  [facility]    Staff Present  Rosebud Poles, RN, BSN;Molly diVincenzo, MS, ACSM RCEP, Exercise Physiologist    Supervising physician immediately available to respond to emergencies  [facility] immediately available    Physician(s)  Dr. Wendee Beavers    Medication changes reported      No    Fall or balance concerns reported     No    Tobacco Cessation  No Change    Warm-up and Cool-down  Performed as group-led instruction    Resistance Training Performed  Yes    VAD Patient?  No      Pain Assessment   Currently in Pain?  No/denies    Multiple Pain Sites  No       Capillary Blood Glucose: No results found for this or any previous visit (from the past 24 hour(s)).    Social History   Tobacco Use  Smoking Status Former Smoker  . Packs/day: 0.50  . Years: 50.00  . Pack years: 25.00  . Types: Cigarettes  . Last attempt to quit: 07/29/2011  . Years since quitting: 6.3  Smokeless Tobacco Never Used    Goals Met:  Exercise tolerated well Strength training completed today  Goals Unmet:  Not Applicable  Comments: Service time is from 0930 to 1100.    Dr. Rush Farmer is Medical Director for Pulmonary Rehab at [facility].

## 2017-12-17 NOTE — Progress Notes (Signed)
I have reviewed a Home Exercise Prescription with Troy Kenner [name] Troy [name] is currently exercising at home.  The patient was advised to walk 2 days a week for 30 minutes.  Troy [name] and I discussed how to progress their exercise prescription.  The patient stated that their goals were to decrease shortness of breath with activity and increase walking on a daily basis.  The patient stated that they understand the exercise prescription.  We reviewed exercise guidelines, target heart rate during exercise, oxygen use, weather, home pulse oximeter, endpoints for exercise, and goals.  Patient is encouraged to come to me with any questions. I will continue to follow up with the patient to assist them with progression and safety.

## 2017-12-19 ENCOUNTER — Encounter (HOSPITAL_COMMUNITY)
Admission: RE | Admit: 2017-12-19 | Discharge: 2017-12-19 | Disposition: A | Discharge status: Home / Self Care | Payer: Commercial Managed Care - PPO | Source: Ambulatory Visit | Attending: Internal Medicine | Admitting: Internal Medicine

## 2017-12-19 VITALS — Wt 206.8 lb

## 2017-12-19 DIAGNOSIS — J449 Chronic obstructive pulmonary disease, unspecified: Secondary | ICD-10-CM

## 2017-12-19 NOTE — Progress Notes (Signed)
Daily Session Note  Patient Details  Name: Troy Paradiso [name] MRN: [id number] Date of Birth: 10-28-1940 Referring Provider:     Pulmonary Rehab Walk Test from 11/26/2017 in [place]  Referring Provider  Dr. Melvyn Novas      Encounter Date: 12/19/2017  Check In: Session Check In - 12/19/17 0930      Check-In   Location  [facility]    Staff Present  Rosebud Poles, RN, BSN;Molly diVincenzo, MS, ACSM RCEP, Exercise Physiologist    Supervising physician immediately available to respond to emergencies  [facility] immediately available    Physician(s)  Dr. Lonny Prude    Medication changes reported      No    Fall or balance concerns reported     No    Tobacco Cessation  No Change    Warm-up and Cool-down  Performed as group-led instruction    Resistance Training Performed  Yes    VAD Patient?  No      Pain Assessment   Currently in Pain?  No/denies    Multiple Pain Sites  No       Capillary Blood Glucose: No results found for this or any previous visit (from the past 24 hour(s)).  Exercise Prescription Changes - 12/19/17 1300      Response to Exercise   Blood Pressure (Admit)  134/70    Blood Pressure (Exercise)  132/58    Blood Pressure (Exit)  124/60    Heart Rate (Admit)  60 bpm    Heart Rate (Exercise)  54 bpm    Heart Rate (Exit)  59 bpm    Oxygen Saturation (Admit)  95 %    Oxygen Saturation (Exercise)  94 %    Oxygen Saturation (Exit)  94 %    Rating of Perceived Exertion (Exercise)  13    Perceived Dyspnea (Exercise)  2    Duration  Continue with 45 min of aerobic exercise without signs/symptoms of physical distress.    Intensity  THRR unchanged      Progression   Progression  Continue to progress workloads to maintain intensity without signs/symptoms of physical distress.      Resistance Training   Training Prescription  Yes    Weight  Blue bands    Reps  10-15    Time  10 Minutes      Bike   Level  2    Minutes  17      NuStep   Level  4    SPM  80    Minutes  17    METs  1.8      Track   Laps  10    Minutes  17       Social History   Tobacco Use  Smoking Status Former Smoker  . Packs/day: 0.50  . Years: 50.00  . Pack years: 25.00  . Types: Cigarettes  . Last attempt to quit: 07/29/2011  . Years since quitting: 6.3  Smokeless Tobacco Never Used    Goals Met:  Exercise tolerated well Strength training completed today  Goals Unmet:  Not Applicable  Comments: Service time is from 0930 to 1130.    Dr. Rush Farmer is Medical Director for Pulmonary Rehab at [facility].

## 2017-12-24 ENCOUNTER — Encounter (HOSPITAL_COMMUNITY)
Admission: RE | Admit: 2017-12-24 | Discharge: 2017-12-24 | Disposition: A | Discharge status: Home / Self Care | Payer: Commercial Managed Care - PPO | Source: Ambulatory Visit | Attending: Internal Medicine | Admitting: Internal Medicine

## 2017-12-24 DIAGNOSIS — J449 Chronic obstructive pulmonary disease, unspecified: Secondary | ICD-10-CM | POA: Diagnosis not present

## 2017-12-24 NOTE — Progress Notes (Signed)
Daily Session Note  Patient Details  Name: Troy Rafter [name] MRN: [id number] Date of Birth: 1941-05-31 Referring Provider:     Pulmonary Rehab Walk Test from 11/26/2017 in [place]  Referring Provider  Dr. Melvyn Novas      Encounter Date: 12/24/2017  Check In: Session Check In - 12/24/17 1221      Check-In   Location  [facility]    Staff Present  Su Hilt, MS, ACSM RCEP, Exercise Physiologist;Portia Rollene Rotunda, RN, BSN    Supervising physician immediately available to respond to emergencies  [facility] immediately available    Physician(s)  Dr. Altamese Cabal Blood Glucose: No results found for this or any previous visit (from the past 24 hour(s)).    Social History   Tobacco Use  Smoking Status Former Smoker  . Packs/day: 0.50  . Years: 50.00  . Pack years: 25.00  . Types: Cigarettes  . Last attempt to quit: 07/29/2011  . Years since quitting: 6.4  Smokeless Tobacco Never Used    Goals Met:  Exercise tolerated well No report of cardiac concerns or symptoms Strength training completed today  Goals Unmet:  Not Applicable  Comments: Service time is from 9:30a to 10:50a    Dr. Rush Farmer is Medical Director for Pulmonary Rehab at [facility].

## 2017-12-26 ENCOUNTER — Encounter (HOSPITAL_COMMUNITY)
Admission: RE | Admit: 2017-12-26 | Discharge: 2017-12-26 | Disposition: A | Discharge status: Home / Self Care | Payer: Commercial Managed Care - PPO | Source: Ambulatory Visit | Attending: Internal Medicine | Admitting: Internal Medicine

## 2017-12-26 DIAGNOSIS — J449 Chronic obstructive pulmonary disease, unspecified: Secondary | ICD-10-CM | POA: Diagnosis not present

## 2017-12-26 NOTE — Progress Notes (Signed)
Daily Session Note  Patient Details  Name: Troy Tuccillo [name] MRN: [id number] Date of Birth: 09-29-1941 Referring Provider:     Pulmonary Rehab Walk Test from 11/26/2017 in [place]  Referring Provider  Dr. Melvyn Novas      Encounter Date: 12/26/2017  Check In: Session Check In - 12/26/17 1236      Check-In   Location  [facility]    Staff Present  Su Hilt, MS, ACSM RCEP, Exercise Physiologist;Portia Rollene Rotunda, RN, Roque Cash, RN    Supervising physician immediately available to respond to emergencies  [facility] immediately available    Physician(s)  Dr. Eliseo Squires    Medication changes reported      No    Fall or balance concerns reported     No    Tobacco Cessation  No Change    Warm-up and Cool-down  Performed as group-led instruction    Resistance Training Performed  Yes    VAD Patient?  No      Pain Assessment   Currently in Pain?  No/denies    Multiple Pain Sites  No       Capillary Blood Glucose: No results found for this or any previous visit (from the past 24 hour(s)).    Social History   Tobacco Use  Smoking Status Former Smoker  . Packs/day: 0.50  . Years: 50.00  . Pack years: 25.00  . Types: Cigarettes  . Last attempt to quit: 07/29/2011  . Years since quitting: 6.4  Smokeless Tobacco Never Used    Goals Met:  Exercise tolerated well No report of cardiac concerns or symptoms Strength training completed today  Goals Unmet:  Not Applicable  Comments: Service time is from 10:30a to 12:15p    Dr. Rush Farmer is Medical Director for Pulmonary Rehab at [facility]

## 2017-12-27 ENCOUNTER — Ambulatory Visit (INDEPENDENT_AMBULATORY_CARE_PROVIDER_SITE_OTHER): Payer: Commercial Managed Care - PPO | Admitting: Internal Medicine

## 2017-12-27 ENCOUNTER — Encounter: Payer: Self-pay | Admitting: Internal Medicine

## 2017-12-27 VITALS — BP 122/82 | HR 55 | Ht 73.5 in | Wt 208.0 lb

## 2017-12-27 DIAGNOSIS — J449 Chronic obstructive pulmonary disease, unspecified: Secondary | ICD-10-CM | POA: Diagnosis not present

## 2017-12-27 NOTE — Progress Notes (Signed)
Subjective:     Patient ID: Troy Kenner [name], male   DOB: 1941/06/29     MRN: [id number]   Brief patient profile:  4   yowm quit smoking Oct 2014 s/p RLLobecty 2012 at [name] with Ib Sq cell ca and no endobronchial dz at that time and with no RT / chemo being followed by [facility] / [facility] with last ct chest done in 03/2014 showing persistent moderate R effusion  due for f/u 04/2015 with new onset gen persistent   fatigue winter of 2015 then very abruptly ill 01/08/15 sore throat, cough congestion yellow mucus, light headed rx doxy / pred and referrred to pulmonary clninic 02/03/2015 by Dr Linna Darner > dx was pna> cleared  Out then f/u ct at [facility] 04/19/17 showed ? Nodules in RLL and referred himself to pulmonary clinic 08/08/2017       History of Present Illness  08/08/2017  ov/Natoshia Souter re:  Consultation req by Dr Aundra Dubin  Chief Complaint  Patient presents with  . Follow-up    Pt is having SOB with exertion for last 6 months becoming bothersome. Pt is wheezing and has dry cough.   doe = sob getting dressed x 4-5 months  Sleeps ok Legs R > L claudication stops him before his breathing does  On amiodarone  X  03/05/17 but only 100 mg since 03/28/17  rec Complete the cardiac work up then return here to regroup if breathing not back to your level of satisfaction      08/20/17  Nl LHC/ lvedp =13 > referred back to pulmonary   09/02/2017  f/u ov/Tacha Manni re:   GOLD II copd/ on amiodarone and no pulmonary meds  Chief Complaint  Patient presents with  . Follow-up    Pt states Dr Aundra Dubin rec that he come back for f/u. He states his breathing is unchanged since the last visit here.   Walking regularly around his neighborhood including some hills now but really doesn't know how to pace himself and has not been observing his 02  Saturations and shows interest in rehabilitation. rec Keep walking with goal of 30 minutes daily at a pace where you are short of breath but never out of breath You need to buy a pulse oximeter  and monitor your oxygen level when you walk and call me if you notice a downward trend in your 02 saturations or exercise toleranc (goal is keep it above 90%)  We will be referring you for pulmonary rehab    12/27/2017  f/u ov/Alyanah Elliott re: GOLD II copd/ still on amiodarone   Chief Complaint  Patient presents with  . Follow-up    congestion x 3days no cough , SOB with activity    Dyspnea:  Improving / not limited  Cough: last few days/ no excess mucus  Sleep: ok  SABA use:  None   No obvious day to day or daytime variability or assoc excess/ purulent sputum or mucus plugs or hemoptysis or cp or chest tightness, subjective wheeze or overt sinus or hb symptoms. No unusual exposure hx or h/o childhood pna/ asthma or knowledge of premature birth.  Sleeping ok flat without nocturnal  or early am exacerbation  of respiratory  c/o's or need for noct saba. Also denies any obvious fluctuation of symptoms with weather or environmental changes or other aggravating or alleviating factors except as outlined above   Current Allergies, Complete Past Medical History, Past Surgical History, Family History, and Social History were reviewed in [employer]  medical record.  ROS  The following are not active complaints unless bolded Hoarseness, sore throat, dysphagia, dental problems, itching, sneezing,  nasal congestion or discharge of excess mucus or purulent secretions, ear ache,   fever, chills, sweats, unintended wt loss or wt gain, classically pleuritic or exertional cp,  orthopnea pnd or leg swelling, presyncope, palpitations, abdominal pain, anorexia, nausea, vomiting, diarrhea  or change in bowel habits or change in bladder habits, change in stools or change in urine, dysuria, hematuria,  rash, arthralgias, visual complaints, headache, numbness, weakness or ataxia or problems with walking or coordination,  change in mood/affect or memory.        Current Meds  Medication Sig  . acetaminophen  (TYLENOL) 325 MG tablet Take 650 mg every 6 (six) hours as needed by mouth for moderate pain or headache.  [name] amiodarone (PACERONE) 200 MG tablet TAKE 1/2 TABLET EVERY DAY.  . bimatoprost (LUMIGAN) 0.01 % SOLN Place 1 drop at bedtime into both eyes.   . Biotin 10000 MCG TABS Take 10,000 mcg daily by mouth.   [name] CARDURA 4 MG tablet TAKE (1/2) TABLET DAILY.  [name] diltiazem (CARDIZEM CD) 120 MG 24 hr capsule TAKE 1 CAPSULE DAILY. (Patient taking differently: Take 120 mg by mouth daily)  . dorzolamide-timolol (COSOPT) 22.3-6.8 MG/ML ophthalmic solution Place 2 drops 2 (two) times daily into the right eye.   [name] ELIQUIS 5 MG TABS tablet TAKE 1 TABLET BY MOUTH TWICE DAILY.  [name] FLUoxetine (PROZAC) 20 MG capsule TAKE (1) CAPSULE DAILY.  . furosemide (LASIX) 40 MG tablet TAKE 1/2 TABLET (20MG ) DAILY ALTERNATING WITH 1 TABLET (40MG ).  . iron polysaccharides (POLY-IRON 150) 150 MG capsule Take 150 mg by mouth daily.  . Melatonin 5 MG TABS Take 5 mg by mouth at bedtime.  . Multiple Vitamin (MULTIVITAMIN WITH MINERALS) TABS tablet Take 1 tablet by mouth daily. Centrum Silver  . Polyethyl Glycol-Propyl Glycol (SYSTANE OP) Place 1-2 drops as needed into both eyes (for dry eyes).  . potassium chloride SA (K-DUR,KLOR-CON) 20 MEQ tablet TAKE 1 TABLET ONCE DAILY. (Patient taking differently: Take 20 MEQ by mouth once daily)  . ranitidine (ZANTAC) 150 MG tablet Take 150 mg by mouth daily as needed for heartburn.   . rosuvastatin (CRESTOR) 40 MG tablet TAKE 1 TABLET ONCE DAILY.                Objective:   Physical Exam  Pleasant amb wm nad  12/27/2017       208  09/02/2017       209   08/08/2017      214   02/03/15 197 lb (89.359 kg)  01/12/15 200 lb 8 oz (90.946 kg)  12/28/14 200 lb (90.719 kg)       Vital signs reviewed - Note on arrival 02 sats  94% on RA          HEENT: nl dentition, turbinates bilaterally, and oropharynx. Nl external ear canals without cough reflex   NECK :  without  JVD/Nodes/TM/ nl carotid upstrokes bilaterally   LUNGS: no acc muscle use,  Nl contour chest clear x for decreased bs R Base  without cough on insp or exp maneuvers   CV:  RRR  no s3 or murmur or increase in P2, and min bilateral sym pedal edema  ABD:  soft and nontender with nl inspiratory excursion in the supine position. No bruits or organomegaly appreciated, bowel sounds nl  MS:  Nl gait/ ext warm without deformities, calf  tenderness, cyanosis or clubbing No obvious joint restrictions   SKIN: warm and dry without lesions    NEURO:  alert, approp, nl sensorium with  no motor or cerebellar deficits apparent.             Assessment:

## 2017-12-27 NOTE — Patient Instructions (Addendum)
Cough > mucinex dm up to 1200 mg every 12 hours as needed   Let us know your exact diagnosis from your eye doctor re glaucoma ? Open anlge or closed?  Please schedule a follow up visit in 6  months but call sooner if needed

## 2017-12-28 ENCOUNTER — Encounter: Payer: Self-pay | Admitting: Internal Medicine

## 2017-12-28 NOTE — Assessment & Plan Note (Addendum)
Quit smoking 07/2013 - PFT's  1/14//2018  FEV1 2.10 (62 % ) ratio 64  p 7 % improvement from saba p nothing prior to study with DLCO  40/40c % corrects to 59  % for alv volume  And typical curvature  - 08/08/2017  Walked RA x 3 laps @ 185 ft each stopped due to  End of study, nl pace, no  Desat/ min sob with legs stopping him from going further     - PFT's  08/13/17   FEV1 1.98 (59 % ) ratio 64  p nothing prior to study with DLCO  34 % corrects to 56 % for alv volume  > refused trial of saba "afraid of glaucoma effects"   - Referred to pulmonary rehab 09/02/2017 > doing great s desats as of 12/27/2017 despite rx with amiodarone  Not clear he has closed angle or severe glaucoma as contraindication to any resp meds but the good news is that he doesn't need them and doing quite well in rehab with only complaint mild am cough not even tried mucinex products yet > reviewed prn rx  Each maintenance medication was reviewed in detail including most importantly the difference between maintenance and as needed and under what circumstances the prns are to be used.  Please see AVS for specific  Instructions which are unique to this visit and I personally typed out  which were reviewed in detail in writing with the patient and a copy provided.

## 2017-12-31 ENCOUNTER — Encounter (HOSPITAL_COMMUNITY)
Admission: RE | Admit: 2017-12-31 | Discharge: 2017-12-31 | Disposition: A | Discharge status: Home / Self Care | Payer: Commercial Managed Care - PPO | Source: Ambulatory Visit | Attending: Internal Medicine | Admitting: Internal Medicine

## 2017-12-31 VITALS — Wt 208.6 lb

## 2017-12-31 DIAGNOSIS — J449 Chronic obstructive pulmonary disease, unspecified: Secondary | ICD-10-CM | POA: Diagnosis present

## 2017-12-31 NOTE — Progress Notes (Signed)
Daily Session Note  Patient Details  Name: Troy Glockner [name] MRN: [id number] Date of Birth: 07-10-1941 Referring Provider:     Pulmonary Rehab Walk Test from 11/26/2017 in [place]  Referring Provider  Dr. Melvyn Novas      Encounter Date: 12/31/2017  Check In: Session Check In - 12/31/17 1001      Check-In   Location  [facility]    Staff Present  Su Hilt, MS, ACSM RCEP, Exercise Physiologist;Portia Rollene Rotunda, RN, BSN    Supervising physician immediately available to respond to emergencies  [facility] immediately available    Physician(s)  Dr. Posey Pronto    Medication changes reported      No    Fall or balance concerns reported     No    Tobacco Cessation  No Change    Warm-up and Cool-down  Performed as group-led instruction    Resistance Training Performed  Yes    VAD Patient?  No      Pain Assessment   Currently in Pain?  No/denies    Multiple Pain Sites  No       Capillary Blood Glucose: No results found for this or any previous visit (from the past 24 hour(s)).  Exercise Prescription Changes - 12/31/17 1200      Response to Exercise   Blood Pressure (Admit)  150/66    Blood Pressure (Exercise)  118/60    Blood Pressure (Exit)  130/70    Heart Rate (Admit)  53 bpm    Heart Rate (Exercise)  60 bpm    Heart Rate (Exit)  49 bpm    Oxygen Saturation (Admit)  97 %    Oxygen Saturation (Exercise)  94 %    Oxygen Saturation (Exit)  97 %    Rating of Perceived Exertion (Exercise)  13    Perceived Dyspnea (Exercise)  3    Duration  Continue with 45 min of aerobic exercise without signs/symptoms of physical distress.    Intensity  THRR unchanged      Progression   Progression  Continue to progress workloads to maintain intensity without signs/symptoms of physical distress.      Resistance Training   Training Prescription  Yes    Weight  Blue bands    Reps  10-15    Time  10 Minutes      Bike   Level  4    Minutes   17      NuStep   Level  4    SPM  80    Minutes  17    METs  1.8      Track   Laps  10    Minutes  17       Social History   Tobacco Use  Smoking Status Former Smoker  . Packs/day: 0.50  . Years: 50.00  . Pack years: 25.00  . Types: Cigarettes  . Last attempt to quit: 07/29/2011  . Years since quitting: 6.4  Smokeless Tobacco Never Used    Goals Met:  Exercise tolerated well No report of cardiac concerns or symptoms Strength training completed today  Goals Unmet:  Not Applicable  Comments: Service time is from 9:30a to 11:00a    Dr. Rush Farmer is Medical Director for Pulmonary Rehab at [facility].

## 2018-01-02 ENCOUNTER — Encounter (HOSPITAL_COMMUNITY)
Admission: RE | Admit: 2018-01-02 | Discharge: 2018-01-02 | Disposition: A | Discharge status: Home / Self Care | Payer: Commercial Managed Care - PPO | Source: Ambulatory Visit | Attending: Internal Medicine | Admitting: Internal Medicine

## 2018-01-02 DIAGNOSIS — J449 Chronic obstructive pulmonary disease, unspecified: Secondary | ICD-10-CM | POA: Diagnosis not present

## 2018-01-02 NOTE — Progress Notes (Signed)
Daily Session Note  Patient Details  Name: Troy Egerton [name] MRN: [id number] Date of Birth: 09-03-1941 Referring Provider:     Pulmonary Rehab Walk Test from 11/26/2017 in [place]  Referring Provider  Dr. Melvyn Novas      Encounter Date: 01/02/2018  Check In: Session Check In - 01/02/18 0930      Check-In   Location  [facility]    Staff Present  Su Hilt, MS, ACSM RCEP, Exercise Physiologist;Daequan Kozma Rollene Rotunda, RN, BSN    Supervising physician immediately available to respond to emergencies  [facility] immediately available    Physician(s)  Dr. Lonny Prude    Medication changes reported      No    Fall or balance concerns reported     No    Tobacco Cessation  No Change    Warm-up and Cool-down  Performed as group-led instruction    Resistance Training Performed  Yes    VAD Patient?  No      Pain Assessment   Currently in Pain?  No/denies    Multiple Pain Sites  No       Capillary Blood Glucose: No results found for this or any previous visit (from the past 24 hour(s)).    Social History   Tobacco Use  Smoking Status Former Smoker  . Packs/day: 0.50  . Years: 50.00  . Pack years: 25.00  . Types: Cigarettes  . Last attempt to quit: 07/29/2011  . Years since quitting: 6.4  Smokeless Tobacco Never Used    Goals Met:  Improved SOB with ADL's Using PLB without cueing & demonstrates good technique Exercise tolerated well No report of cardiac concerns or symptoms Strength training completed today  Goals Unmet:  Not Applicable  Comments: Service time is from 0930 to 1115   Dr. Rush Farmer is Medical Director for Pulmonary Rehab at [facility].

## 2018-01-05 ENCOUNTER — Other Ambulatory Visit (HOSPITAL_COMMUNITY): Payer: Self-pay | Admitting: Cardiology

## 2018-01-05 DIAGNOSIS — R0602 Shortness of breath: Secondary | ICD-10-CM

## 2018-01-05 DIAGNOSIS — I509 Heart failure, unspecified: Secondary | ICD-10-CM

## 2018-01-06 ENCOUNTER — Ambulatory Visit (INDEPENDENT_AMBULATORY_CARE_PROVIDER_SITE_OTHER): Payer: Commercial Managed Care - PPO | Admitting: *Deleted

## 2018-01-06 DIAGNOSIS — I480 Paroxysmal atrial fibrillation: Secondary | ICD-10-CM | POA: Diagnosis not present

## 2018-01-06 LAB — CUP PACEART REMOTE DEVICE CHECK
Date Time Interrogation Session: 20190305004011
MDC IDC PG IMPLANT DT: 20161111

## 2018-01-06 NOTE — Progress Notes (Signed)
Pulmonary Individual Treatment Plan  Patient Details  Name: Troy Aldous [name] MRN: [id number] Date of Birth: Mar 20, 1941 Referring Provider:     Pulmonary Rehab Walk Test from 11/26/2017 in [place]  Referring Provider  Dr. Melvyn Novas      Initial Encounter Date:    Pulmonary Rehab Walk Test from 11/26/2017 in [place]  Date  11/28/17  Referring Provider  Dr. Melvyn Novas      Visit Diagnosis: Chronic obstructive pulmonary disease, unspecified COPD type ([place])  Patient's Home Medications on Admission:   Current Outpatient Medications:  .  acetaminophen (TYLENOL) 325 MG tablet, Take 650 mg every 6 (six) hours as needed by mouth for moderate pain or headache., Disp: , Rfl:  .  amiodarone (PACERONE) 200 MG tablet, TAKE 1/2 TABLET EVERY DAY., Disp: 15 tablet, Rfl: 2 .  bimatoprost (LUMIGAN) 0.01 % SOLN, Place 1 drop at bedtime into both eyes. , Disp: , Rfl:  .  Biotin 10000 MCG TABS, Take 10,000 mcg daily by mouth. , Disp: , Rfl:  .  CARDURA 4 MG tablet, TAKE (1/2) TABLET DAILY., Disp: 15 tablet, Rfl: 5 .  diltiazem (CARDIZEM CD) 120 MG 24 hr capsule, TAKE 1 CAPSULE DAILY. (Patient taking differently: Take 120 mg by mouth daily), Disp: 90 capsule, Rfl: 3 .  dorzolamide-timolol (COSOPT) 22.3-6.8 MG/ML ophthalmic solution, Place 2 drops 2 (two) times daily into the right eye. , Disp: , Rfl:  .  ELIQUIS 5 MG TABS tablet, TAKE 1 TABLET BY MOUTH TWICE DAILY., Disp: 60 tablet, Rfl: 3 .  FLUoxetine (PROZAC) 20 MG capsule, TAKE (1) CAPSULE DAILY., Disp: 30 capsule, Rfl: 5 .  furosemide (LASIX) 40 MG tablet, TAKE 1/2 TABLET ('20MG'$ ) DAILY ALTERNATING WITH 1 TABLET ('40MG'$ )., Disp: 90 tablet, Rfl: 3 .  iron polysaccharides (POLY-IRON 150) 150 MG capsule, Take 150 mg by mouth daily., Disp: , Rfl:  .  Melatonin 5 MG TABS, Take 5 mg by mouth at bedtime., Disp: , Rfl:  .  Multiple Vitamin (MULTIVITAMIN WITH MINERALS) TABS tablet, Take 1 tablet by mouth  daily. Centrum Silver, Disp: , Rfl:  .  Polyethyl Glycol-Propyl Glycol (SYSTANE OP), Place 1-2 drops as needed into both eyes (for dry eyes)., Disp: , Rfl:  .  potassium chloride SA (K-DUR,KLOR-CON) 20 MEQ tablet, TAKE 1 TABLET ONCE DAILY. (Patient taking differently: Take 20 MEQ by mouth once daily), Disp: 90 tablet, Rfl: 3 .  ranitidine (ZANTAC) 150 MG tablet, Take 150 mg by mouth daily as needed for heartburn. , Disp: , Rfl:  .  rosuvastatin (CRESTOR) 40 MG tablet, TAKE 1 TABLET ONCE DAILY., Disp: 30 tablet, Rfl: 6  Past Medical History: Past Medical History:  Diagnosis Date  . AAA (abdominal aortic aneurysm) (HCC) LAST ABD. [name]  JAN 2014  3.4CM   MONITORED BY DR [name]  . Arthritis   . Atrial fibrillation ([place])   . Benign essential HTN 01/19/2016  . Bilateral carotid artery stenosis DUPLEX 12-29-2012  BY DR [facility]   BILATERAL ICA STENOSIS 60-79%  . BPH (benign prostatic hypertrophy)   . Cancer ([place]) 01-25-12   LUNG  . Complication of anesthesia    had nausea after carotid artery surgery, states the medicine given to help the nausea, made it worse  . COPD (chronic obstructive pulmonary disease) ([place])   . Exertional dyspnea   . Frequency of urination   . GERD (gastroesophageal reflux disease)   . Glaucoma BOTH EYES   Dr Gershon Crane  .  History of basal cell carcinoma excision   . History of lung cancer APRIL 2012  SQUAMOUS CELL---- S/P RIGHT LOWER LOBECTOMY AT [facility] --  NO CHEMORADIATION---  NO RECURRENCE    ONCOLOGIST- DR Tressie Stalker  LOV IN [facility] 10-27-2012  . Hx of adenomatous colonic polyps 2005    X 2; 1 hyperplastic polyp; Dr Olevia Perches  . Hyperlipidemia   . Hypertension   . Impaired fasting glucose 2007   108; A1c5.4%  . Lesion of bladder   . Microhematuria   . Nocturia   . OSA (obstructive sleep apnea) 08/29/2015   Mild with AHI overall 6/hr but severe during REM sleep with AHI 30/hr with oxygen saturations < 88% for 5.5 minutes of total sleep time.    [name] PAD (peripheral artery  disease) ([place]) ABI'S  JAN 2014  0.65 ON RIGHT ;  1.04 ON LEFT  . Peripheral vascular disease ([place]) S/P ANGIOPLASTY AND STENTING   FOLLOWED  BY DR [name]  . Spinal stenosis Sept. 2015  . Stroke [facility]) Jul 07, 2013   mini  TIA  . Urgency of urination     Tobacco Use: Social History   Tobacco Use  Smoking Status Former Smoker  . Packs/day: 0.50  . Years: 50.00  . Pack years: 25.00  . Types: Cigarettes  . Last attempt to quit: 07/29/2011  . Years since quitting: 6.4  Smokeless Tobacco Never Used    Labs: Recent Review Flowsheet Data    Labs for ITP Cardiac and Pulmonary Rehab Latest Ref Rng & Units 01/04/2016 11/27/2016 05/03/2017 10/25/2017 11/04/2017   Cholestrol 0 - 200 mg/dL 95(L) 113 - 105 -   LDLCALC 0 - 99 mg/dL 39 39 - 50 -   HDL >40 mg/dL 39(L) 46 - 35(L) -   Trlycerides <150 mg/dL 86 140 - 101 -   Hemoglobin A1c - - - 5.7 - 5.9   TCO2 0 - 100 mmol/L - - - - -      Capillary Blood Glucose: Lab Results  Component Value Date   GLUCAP 97 08/29/2015   GLUCAP 111 (H) 07/07/2013   GLUCAP 112 (H) 12/22/2010     Pulmonary Assessment Scores: Pulmonary Assessment Scores    Row Name 11/21/17 1209 11/28/17 0841       ADL UCSD   ADL Phase  Entry  Entry    SOB Score total  64  -      CAT Score   CAT Score  18 Entry  -      mMRC Score   mMRC Score  -  1       Pulmonary Function Assessment: Pulmonary Function Assessment - 11/18/17 1243      Pulmonary Function Tests   FEV1%  62 %    FEV1/FVC Ratio  64       Exercise Target Goals:    Exercise Program Goal: Individual exercise prescription set using results from initial 6 min walk test and THRR while considering  patient's activity barriers and safety.    Exercise Prescription Goal: Initial exercise prescription builds to 30-45 minutes a day of aerobic activity, 2-3 days per week.  Home exercise guidelines will be given to patient during program as part of exercise prescription that the participant will  acknowledge.  Activity Barriers & Risk Stratification: Activity Barriers & Cardiac Risk Stratification - 11/18/17 1247      Activity Barriers & Cardiac Risk Stratification   Activity Barriers  -- eyesight       6 Minute  Walk: 6 Minute Walk    Row Name 11/28/17 0838         6 Minute Walk   Phase  Initial     Distance  1200 feet     Walk Time  6 minutes     # of Rest Breaks  0     MPH  2.27     METS  3.3     RPE  13     Perceived Dyspnea   3     Symptoms  Yes (comment)     Comments  8/10 calf pain     Resting HR  59 bpm     Resting BP  124/58     Resting Oxygen Saturation   94 %     Exercise Oxygen Saturation  during 6 min walk  92 %     Max Ex. HR  117 bpm     Max Ex. BP  146/60       Interval HR   1 Minute HR  98     2 Minute HR  98     3 Minute HR  96     4 Minute HR  117     5 Minute HR  117     6 Minute HR  70     2 Minute Post HR  59     Interval Heart Rate?  Yes       Interval Oxygen   Interval Oxygen?  Yes     Baseline Oxygen Saturation %  94 %     1 Minute Oxygen Saturation %  94 %     1 Minute Liters of Oxygen  0 L     2 Minute Oxygen Saturation %  93 %     2 Minute Liters of Oxygen  0 L     3 Minute Oxygen Saturation %  92 %     3 Minute Liters of Oxygen  0 L     4 Minute Oxygen Saturation %  93 %     4 Minute Liters of Oxygen  0 L     5 Minute Oxygen Saturation %  92 %     5 Minute Liters of Oxygen  0 L     6 Minute Oxygen Saturation %  94 %     6 Minute Liters of Oxygen  0 L     2 Minute Post Oxygen Saturation %  97 %     2 Minute Post Liters of Oxygen  0 L        Oxygen Initial Assessment: Oxygen Initial Assessment - 12/09/17 0736      Home Oxygen   Home Oxygen Device  None    Sleep Oxygen Prescription  CPAP    Home Exercise Oxygen Prescription  None    Home at Rest Exercise Oxygen Prescription  None    Compliance with Home Oxygen Use  Yes      Initial 6 min Walk   Oxygen Used  None      Program Oxygen Prescription   Program  Oxygen Prescription  None       Oxygen Re-Evaluation: Oxygen Re-Evaluation    Row Name 01/03/18 1025             Program Oxygen Prescription   Program Oxygen Prescription  None         Home Oxygen   Home Oxygen Device  None       Sleep Oxygen  Prescription  CPAP       Home Exercise Oxygen Prescription  None       Home at Rest Exercise Oxygen Prescription  None       Compliance with Home Oxygen Use  Yes         Goals/Expected Outcomes   Goals/Expected Outcomes  Cont. to be compliant with CPAP          Oxygen Discharge (Final Oxygen Re-Evaluation): Oxygen Re-Evaluation - 01/03/18 1025      Program Oxygen Prescription   Program Oxygen Prescription  None      Home Oxygen   Home Oxygen Device  None    Sleep Oxygen Prescription  CPAP    Home Exercise Oxygen Prescription  None    Home at Rest Exercise Oxygen Prescription  None    Compliance with Home Oxygen Use  Yes      Goals/Expected Outcomes   Goals/Expected Outcomes  Cont. to be compliant with CPAP       Initial Exercise Prescription: Initial Exercise Prescription - 11/28/17 0800      Date of Initial Exercise RX and Referring Provider   Date  11/28/17    Referring Provider  Dr. Melvyn Novas      Bike   Level  0.6    Minutes  17      NuStep   Level  2    SPM  80    Minutes  17    METs  1.5      Track   Laps  10    Minutes  17      Prescription Details   Frequency (times per week)  2    Duration  Progress to 45 minutes of aerobic exercise without signs/symptoms of physical distress      Intensity   THRR 40-80% of Max Heartrate  57-114    Ratings of Perceived Exertion  11-13    Perceived Dyspnea  0-4      Progression   Progression  Continue progressive overload as per policy without signs/symptoms or physical distress.      Resistance Training   Training Prescription  Yes    Weight  BLUE BANDS    Reps  10-15       Perform Capillary Blood Glucose checks as needed.  Exercise Prescription  Changes: Exercise Prescription Changes    Row Name 12/03/17 1100 12/17/17 1300 12/19/17 1300 12/31/17 1200       Response to Exercise   Blood Pressure (Admit)  146/80  -  134/70  150/66    Blood Pressure (Exercise)  158/60  -  132/58  118/60    Blood Pressure (Exit)  130/58  -  124/60  130/70    Heart Rate (Admit)  54 bpm  -  60 bpm  53 bpm    Heart Rate (Exercise)  64 bpm  -  54 bpm  60 bpm    Heart Rate (Exit)  48 bpm  -  59 bpm  49 bpm    Oxygen Saturation (Admit)  97 %  -  95 %  97 %    Oxygen Saturation (Exercise)  95 %  -  94 %  94 %    Oxygen Saturation (Exit)  98 %  -  94 %  97 %    Rating of Perceived Exertion (Exercise)  13  -  13  13    Perceived Dyspnea (Exercise)  2  -  2  3  Duration  Progress to 45 minutes of aerobic exercise without signs/symptoms of physical distress  -  Continue with 45 min of aerobic exercise without signs/symptoms of physical distress.  Continue with 45 min of aerobic exercise without signs/symptoms of physical distress.    Intensity  Other (comment)  -  THRR unchanged  THRR unchanged      Progression   Progression  - 40-80% HRR  -  Continue to progress workloads to maintain intensity without signs/symptoms of physical distress.  Continue to progress workloads to maintain intensity without signs/symptoms of physical distress.      Resistance Training   Training Prescription  Yes  -  Yes  Yes    Weight  BLUE BANDS  -  Blue bands  Blue bands    Reps  10-15  -  10-15  10-15    Time  10 Minutes  -  10 Minutes  10 Minutes      Bike   Level  0.6  -  2  4    Minutes  17  -  17  17      NuStep   Level  2  -  4  4    SPM  80  -  80  80    Minutes  17  -  17  17    METs  1.6  -  1.8  1.8      Track   Laps  10  -  10  10    Minutes  17  -  17  17      Home Exercise Plan   Plans to continue exercise at  -  Home (comment)  -  -    Frequency  -  Add 2 additional days to program exercise sessions.  -  -       Exercise Comments: Exercise  Comments    Row Name 12/17/17 1308           Exercise Comments  Home exercise completed          Exercise Goals and Review:   Exercise Goals Re-Evaluation : Exercise Goals Re-Evaluation    [place] Name 12/09/17 0736 01/03/18 1026           Exercise Goal Re-Evaluation   Exercise Goals Review  Increase Strength and Stamina;Able to understand and use Dyspnea scale;Increase Physical Activity;Able to understand and use rate of perceived exertion (RPE) scale;Knowledge and understanding of Target Heart Rate Range (THRR);Understanding of Exercise Prescription  Increase Strength and Stamina;Able to understand and use Dyspnea scale;Increase Physical Activity;Able to understand and use rate of perceived exertion (RPE) scale;Knowledge and understanding of Target Heart Rate Range (THRR);Understanding of Exercise Prescription      Comments  Patient has only attended two rehab sessions. Will cont. to monitor and motivate as able.  PAD is a barrier for this patient. He is progressing wel and has started to exercise at home. Patient is able to walk up to 10 laps (200 ft) in 15 minutes. This includes breaks due to his leg pain. MET level still places him in a "low" functional level. Will cont to monitor patient and progress as able.       Expected Outcomes  Through exercise at rehab, patient will increase strength and stamina and gain enough confidence to start an exercise regime at home.   Through exercise at rehab, patient will increase strength and stamina and gain enough confidence to start an exercise regime at home.  Discharge Exercise Prescription (Final Exercise Prescription Changes): Exercise Prescription Changes - 12/31/17 1200      Response to Exercise   Blood Pressure (Admit)  150/66    Blood Pressure (Exercise)  118/60    Blood Pressure (Exit)  130/70    Heart Rate (Admit)  53 bpm    Heart Rate (Exercise)  60 bpm    Heart Rate (Exit)  49 bpm    Oxygen Saturation (Admit)  97 %     Oxygen Saturation (Exercise)  94 %    Oxygen Saturation (Exit)  97 %    Rating of Perceived Exertion (Exercise)  13    Perceived Dyspnea (Exercise)  3    Duration  Continue with 45 min of aerobic exercise without signs/symptoms of physical distress.    Intensity  THRR unchanged      Progression   Progression  Continue to progress workloads to maintain intensity without signs/symptoms of physical distress.      Resistance Training   Training Prescription  Yes    Weight  Blue bands    Reps  10-15    Time  10 Minutes      Bike   Level  4    Minutes  17      NuStep   Level  4    SPM  80    Minutes  17    METs  1.8      Track   Laps  10    Minutes  17       Nutrition:  Target Goals: Understanding of nutrition guidelines, daily intake of sodium '1500mg'$ , cholesterol '200mg'$ , calories 30% from fat and 7% or less from saturated fats, daily to have 5 or more servings of fruits and vegetables.  Biometrics: Pre Biometrics - 11/18/17 1248      Pre Biometrics   Grip Strength  28 kg        Nutrition Therapy Plan and Nutrition Goals:   Nutrition Assessments:   Nutrition Goals Re-Evaluation:   Nutrition Goals Discharge (Final Nutrition Goals Re-Evaluation):   Psychosocial: Target Goals: Acknowledge presence or absence of significant depression and/or stress, maximize coping skills, provide positive support system. Participant is able to verbalize types and ability to use techniques and skills needed for reducing stress and depression.  Initial Review & Psychosocial Screening: Initial Psych Review & Screening - 11/18/17 1253      Initial Review   Current issues with  None Identified      Family Dynamics   Good Support System?  Yes      Barriers   Psychosocial barriers to participate in program  There are no identifiable barriers or psychosocial needs.      Screening Interventions   Interventions  Encouraged to exercise       Quality of Life Scores:  Scores  of 19 and below usually indicate a poorer quality of life in these areas.  A difference of  2-3 points is a clinically meaningful difference.  A difference of 2-3 points in the total score of the Quality of Life Index has been associated with significant improvement in overall quality of life, self-image, physical symptoms, and general health in studies assessing change in quality of life.   PHQ-9: Recent Review Flowsheet Data    Depression screen [facility] 2/9 11/18/2017 11/04/2017 01/12/2015   Decreased Interest 0 0 0   Down, Depressed, Hopeless 0 0 0   PHQ - 2 Score 0 0 0     Interpretation  of Total Score  Total Score Depression Severity:  1-4 = Minimal depression, 5-9 = Mild depression, 10-14 = Moderate depression, 15-19 = Moderately severe depression, 20-27 = Severe depression   Psychosocial Evaluation and Intervention: Psychosocial Evaluation - 12/03/17 1237      Psychosocial Evaluation & Interventions   Interventions  Encouraged to exercise with the program and follow exercise prescription    Comments  no psychosocial issues identified    Expected Outcomes  no barriers to participation in pulmonary rehab    Continue Psychosocial Services   No Follow up required       Psychosocial Re-Evaluation: Psychosocial Re-Evaluation    [place] Name 12/03/17 1238 01/06/18 1010           Psychosocial Re-Evaluation   Current issues with  None Identified  None Identified      Expected Outcomes  no barriers to participation in pulmonary rehab  no barriers to participation in pulmonary rehab      Interventions  Encouraged to attend Pulmonary Rehabilitation for the exercise  Encouraged to attend Pulmonary Rehabilitation for the exercise      Continue Psychosocial Services   No Follow up required  Follow up required by staff         Psychosocial Discharge (Final Psychosocial Re-Evaluation): Psychosocial Re-Evaluation - 01/06/18 1010      Psychosocial Re-Evaluation   Current issues with  None  Identified    Expected Outcomes  no barriers to participation in pulmonary rehab    Interventions  Encouraged to attend Pulmonary Rehabilitation for the exercise    Continue Psychosocial Services   Follow up required by staff       Education: Education Goals: Education classes will be provided on a weekly basis, covering required topics. Participant will state understanding/return demonstration of topics presented.  Learning Barriers/Preferences: Learning Barriers/Preferences - 11/18/17 1241      Learning Barriers/Preferences   Learning Barriers  Sight    Learning Preferences  Individual Instruction;Skilled Demonstration;Verbal Instruction calls family with questions       Education Topics: Risk Factor Reduction:  -Group instruction that is supported by a PowerPoint presentation. Instructor discusses the definition of a risk factor, different risk factors for pulmonary disease, and how the heart and lungs work together.     Nutrition for Pulmonary Patient:  -Group instruction provided by PowerPoint slides, verbal discussion, and written materials to support subject matter. The instructor gives an explanation and review of healthy diet recommendations, which includes a discussion on weight management, recommendations for fruit and vegetable consumption, as well as protein, fluid, caffeine, fiber, sodium, sugar, and alcohol. Tips for eating when patients are short of breath are discussed.   PULMONARY REHAB CHRONIC OBSTRUCTIVE PULMONARY DISEASE from 01/02/2018 in [place]  Date  12/19/17  Educator  Parke Simmers  Instruction Review Code  2- Demonstrated Understanding      Pursed Lip Breathing:  -Group instruction that is supported by demonstration and informational handouts. Instructor discusses the benefits of pursed lip and diaphragmatic breathing and detailed demonstration on how to preform both.     Oxygen Safety:  -Group instruction provided by  PowerPoint, verbal discussion, and written material to support subject matter. There is an overview of "What is Oxygen" and "Why do we need it".  Instructor also reviews how to create a safe environment for oxygen use, the importance of using oxygen as prescribed, and the risks of noncompliance. There is a brief discussion on traveling with oxygen and resources the  patient may utilize.   Oxygen Equipment:  -Group instruction provided by [facility] Staff utilizing handouts, written materials, and equipment demonstrations.   Signs and Symptoms:  -Group instruction provided by written material and verbal discussion to support subject matter. Warning signs and symptoms of infection, stroke, and heart attack are reviewed and when to call the physician/911 reinforced. Tips for preventing the spread of infection discussed.   Advanced Directives:  -Group instruction provided by verbal instruction and written material to support subject matter. Instructor reviews Advanced Directive laws and proper instruction for filling out document.   Pulmonary Video:  -Group video education that reviews the importance of medication and oxygen compliance, exercise, good nutrition, pulmonary hygiene, and pursed lip and diaphragmatic breathing for the pulmonary patient.   Exercise for the Pulmonary Patient:  -Group instruction that is supported by a PowerPoint presentation. Instructor discusses benefits of exercise, core components of exercise, frequency, duration, and intensity of an exercise routine, importance of utilizing pulse oximetry during exercise, safety while exercising, and options of places to exercise outside of rehab.     Pulmonary Medications:  -Verbally interactive group education provided by instructor with focus on inhaled medications and proper administration.   PULMONARY REHAB CHRONIC OBSTRUCTIVE PULMONARY DISEASE from 01/02/2018 in [place]  Date  12/26/17   Educator  pharm  Instruction Review Code  2- Demonstrated Understanding      Anatomy and Physiology of the Respiratory System and Intimacy:  -Group instruction provided by PowerPoint, verbal discussion, and written material to support subject matter. Instructor reviews respiratory cycle and anatomical components of the respiratory system and their functions. Instructor also reviews differences in obstructive and restrictive respiratory diseases with examples of each. Intimacy, Sex, and Sexuality differences are reviewed with a discussion on how relationships can change when diagnosed with pulmonary disease. Common sexual concerns are reviewed.   MD DAY -A group question and answer session with a medical doctor that allows participants to ask questions that relate to their pulmonary disease state.   OTHER EDUCATION -Group or individual verbal, written, or video instructions that support the educational goals of the pulmonary rehab program.   PULMONARY REHAB CHRONIC OBSTRUCTIVE PULMONARY DISEASE from 01/02/2018 in [place]  Date  01/02/18 Rodney Booze a sedentary lifestyle]  Educator  EP  Instruction Review Code  1- [employer] Eating Survival Tips:  -Group instruction provided by PowerPoint slides, verbal discussion, and written materials to support subject matter. The instructor gives patients tips, tricks, and techniques to help them not only survive but enjoy the holidays despite the onslaught of food that accompanies the holidays.   Knowledge Questionnaire Score: Knowledge Questionnaire Score - 11/21/17 1209      Knowledge Questionnaire Score   Pre Score  11/18       Core Components/Risk Factors/Patient Goals at Admission: Personal Goals and Risk Factors at Admission - 11/18/17 1251      Core Components/Risk Factors/Patient Goals on Admission   Improve shortness of breath with ADL's  Yes    Intervention  Provide education,  individualized exercise plan and daily activity instruction to help decrease symptoms of SOB with activities of daily living.    Expected Outcomes  Short Term: Improve cardiorespiratory fitness to achieve a reduction of symptoms when performing ADLs;Long Term: Be able to perform more ADLs without symptoms or delay the onset of symptoms       Core Components/Risk Factors/Patient Goals Review:  Goals and Risk Factor Review    Row Name 12/03/17 1235 01/06/18 1009           Core Components/Risk Factors/Patient Goals Review   Personal Goals Review  Improve shortness of breath with ADL's  Improve shortness of breath with ADL's      Review  Has just begun program, has exercised once in program, tolerated it well.  Too early to see progression toward goals  patient is doing well in the pulmonary rehab program. he states he can see a significant improvement in his shortness of breath at home with ADLs and with exercise. he has even started exercising outside of rehab focusing not only on aerobic exercise but stregnth training as well.      Expected Outcomes  see admission goals  see admission goals         Core Components/Risk Factors/Patient Goals at Discharge (Final Review):  Goals and Risk Factor Review - 01/06/18 1009      Core Components/Risk Factors/Patient Goals Review   Personal Goals Review  Improve shortness of breath with ADL's    Review  patient is doing well in the pulmonary rehab program. he states he can see a significant improvement in his shortness of breath at home with ADLs and with exercise. he has even started exercising outside of rehab focusing not only on aerobic exercise but stregnth training as well.    Expected Outcomes  see admission goals       ITP Comments:   Comments: patient has attended 10 rehab sessions since admission

## 2018-01-07 ENCOUNTER — Encounter (HOSPITAL_COMMUNITY)
Admission: RE | Admit: 2018-01-07 | Discharge: 2018-01-07 | Disposition: A | Discharge status: Home / Self Care | Payer: Commercial Managed Care - PPO | Source: Ambulatory Visit | Attending: Internal Medicine | Admitting: Internal Medicine

## 2018-01-07 DIAGNOSIS — J449 Chronic obstructive pulmonary disease, unspecified: Secondary | ICD-10-CM

## 2018-01-07 NOTE — Progress Notes (Signed)
Daily Session Note  Patient Details  Name: Troy Burich [name] MRN: [id number] Date of Birth: 09-Jul-1941 Referring Provider:     Pulmonary Rehab Walk Test from 11/26/2017 in [place]  Referring Provider  Dr. Melvyn Novas      Encounter Date: 01/07/2018  Check In: Session Check In - 01/07/18 1608      Check-In   Location  [facility]    Staff Present  Su Hilt, MS, ACSM RCEP, Exercise Physiologist;Portia Rollene Rotunda, RN, BSN    Supervising physician immediately available to respond to emergencies  [facility] immediately available    Physician(s)  Dr. Grandville Silos    Medication changes reported      No    Fall or balance concerns reported     No    Tobacco Cessation  No Change    Warm-up and Cool-down  Performed as group-led instruction    Resistance Training Performed  Yes    VAD Patient?  No      Pain Assessment   Currently in Pain?  No/denies    Multiple Pain Sites  No       Capillary Blood Glucose: No results found for this or any previous visit (from the past 24 hour(s)).    Social History   Tobacco Use  Smoking Status Former Smoker  . Packs/day: 0.50  . Years: 50.00  . Pack years: 25.00  . Types: Cigarettes  . Last attempt to quit: 07/29/2011  . Years since quitting: 6.4  Smokeless Tobacco Never Used    Goals Met:  Exercise tolerated well No report of cardiac concerns or symptoms Strength training completed today  Goals Unmet:  Not Applicable  Comments: Service time is from 9:30a to 11:00a    Dr. Rush Farmer is Medical Director for Pulmonary Rehab at [facility].

## 2018-01-07 NOTE — Progress Notes (Signed)
Carelink Summary Report / Loop Recorder 

## 2018-01-09 ENCOUNTER — Encounter (HOSPITAL_COMMUNITY)
Admission: RE | Admit: 2018-01-09 | Discharge: 2018-01-09 | Disposition: A | Discharge status: Home / Self Care | Payer: Commercial Managed Care - PPO | Source: Ambulatory Visit | Attending: Internal Medicine | Admitting: Internal Medicine

## 2018-01-09 DIAGNOSIS — J449 Chronic obstructive pulmonary disease, unspecified: Secondary | ICD-10-CM | POA: Diagnosis not present

## 2018-01-09 NOTE — Progress Notes (Signed)
Daily Session Note  Patient Details  Name: Troy [name]. MRN: [id number] Date of Birth: 10-Oct-1940 Referring Provider:     Pulmonary Rehab Walk Test from 11/26/2017 in [place]  Referring Provider  Dr. Melvyn Novas      Encounter Date: 01/09/2018  Check In: Session Check In - 01/09/18 0930      Check-In   Location  [facility]    Staff Present  Su Hilt, MS, ACSM RCEP, Exercise Physiologist;Portia Rollene Rotunda, RN, BSN    Supervising physician immediately available to respond to emergencies  [facility] immediately available    Physician(s)  Dr. Alfredia Ferguson    Medication changes reported      No    Fall or balance concerns reported     No    Tobacco Cessation  No Change    Warm-up and Cool-down  Performed as group-led instruction    Resistance Training Performed  Yes    VAD Patient?  No      Pain Assessment   Currently in Pain?  No/denies    Multiple Pain Sites  No       Capillary Blood Glucose: No results found for this or any previous visit (from the past 24 hour(s)).    Social History   Tobacco Use  Smoking Status Former Smoker  . Packs/day: 0.50  . Years: 50.00  . Pack years: 25.00  . Types: Cigarettes  . Last attempt to quit: 07/29/2011  . Years since quitting: 6.4  Smokeless Tobacco Never Used    Goals Met:  Exercise tolerated well No report of cardiac concerns or symptoms Strength training completed today  Goals Unmet:  Not Applicable  Comments: Service time is from 9:30a to 10:45a    Dr. Rush Farmer is Medical Director for Pulmonary Rehab at [facility].

## 2018-01-14 ENCOUNTER — Encounter (HOSPITAL_COMMUNITY)
Admission: RE | Admit: 2018-01-14 | Discharge: 2018-01-14 | Disposition: A | Discharge status: Home / Self Care | Payer: Commercial Managed Care - PPO | Source: Ambulatory Visit | Attending: Internal Medicine | Admitting: Internal Medicine

## 2018-01-14 VITALS — Wt 205.0 lb

## 2018-01-14 DIAGNOSIS — J449 Chronic obstructive pulmonary disease, unspecified: Secondary | ICD-10-CM | POA: Diagnosis not present

## 2018-01-14 NOTE — Progress Notes (Signed)
Daily Session Note  Patient Details  Name: Troy Yandell [name] MRN: [id number] Date of Birth: Aug 19, 1941 Referring Provider:     Pulmonary Rehab Walk Test from 11/26/2017 in [place]  Referring Provider  Dr. Melvyn Novas      Encounter Date: 01/14/2018  Check In: Session Check In - 01/14/18 1218      Check-In   Location  [facility]    Staff Present  Su Hilt, MS, ACSM RCEP, Exercise Physiologist;Wasif Simonich Rollene Rotunda, RN, Roque Cash, RN    Supervising physician immediately available to respond to emergencies  [facility] immediately available    Physician(s)  Dr. Alfredia Ferguson    Medication changes reported      No    Fall or balance concerns reported     No    Tobacco Cessation  No Change    Warm-up and Cool-down  Performed as group-led instruction    Resistance Training Performed  Yes    VAD Patient?  No      Pain Assessment   Currently in Pain?  No/denies    Multiple Pain Sites  No       Capillary Blood Glucose: No results found for this or any previous visit (from the past 24 hour(s)).  Exercise Prescription Changes - 01/14/18 1220      Response to Exercise   Blood Pressure (Admit)  134/60    Blood Pressure (Exercise)  148/56    Blood Pressure (Exit)  120/62    Heart Rate (Admit)  63 bpm    Heart Rate (Exercise)  62 bpm    Heart Rate (Exit)  57 bpm    Oxygen Saturation (Admit)  94 %    Oxygen Saturation (Exercise)  93 %    Oxygen Saturation (Exit)  97 %    Rating of Perceived Exertion (Exercise)  13    Perceived Dyspnea (Exercise)  1    Duration  Continue with 45 min of aerobic exercise without signs/symptoms of physical distress.    Intensity  THRR unchanged      Progression   Progression  Continue to progress workloads to maintain intensity without signs/symptoms of physical distress.      Resistance Training   Training Prescription  Yes    Weight  Blue bands    Reps  10-15    Time  10 Minutes      Bike   Level  4    Minutes  17      NuStep   Level  5    SPM  80    Minutes  17    METs  1.9      Track   Laps  7    Minutes  17       Social History   Tobacco Use  Smoking Status Former Smoker  . Packs/day: 0.50  . Years: 50.00  . Pack years: 25.00  . Types: Cigarettes  . Last attempt to quit: 07/29/2011  . Years since quitting: 6.4  Smokeless Tobacco Never Used    Goals Met:  Improved SOB with ADL's Using PLB without cueing & demonstrates good technique Exercise tolerated well No report of cardiac concerns or symptoms Strength training completed today  Goals Unmet:  Not Applicable  Comments: Service time is from 0930 to 1035   Dr. Rush Farmer is Medical Director for Pulmonary Rehab at [facility].

## 2018-01-16 ENCOUNTER — Encounter (HOSPITAL_COMMUNITY): Payer: Commercial Managed Care - PPO

## 2018-01-20 ENCOUNTER — Encounter: Payer: Self-pay | Admitting: Pulmonary Disease

## 2018-01-20 ENCOUNTER — Ambulatory Visit (INDEPENDENT_AMBULATORY_CARE_PROVIDER_SITE_OTHER)
Admission: RE | Admit: 2018-01-20 | Discharge: 2018-01-20 | Disposition: A | Discharge status: Home / Self Care | Payer: Commercial Managed Care - PPO | Source: Ambulatory Visit | Attending: Pulmonary Disease | Admitting: Pulmonary Disease

## 2018-01-20 ENCOUNTER — Ambulatory Visit (INDEPENDENT_AMBULATORY_CARE_PROVIDER_SITE_OTHER): Payer: Commercial Managed Care - PPO | Admitting: Pulmonary Disease

## 2018-01-20 VITALS — BP 124/70 | HR 69 | Ht 73.5 in | Wt 202.4 lb

## 2018-01-20 DIAGNOSIS — J441 Chronic obstructive pulmonary disease with (acute) exacerbation: Secondary | ICD-10-CM

## 2018-01-20 MED ORDER — MONTELUKAST SODIUM 10 MG PO TABS: 10.0000 mg | ORAL_TABLET | Freq: Every day | ORAL | 5 refills | Status: DC

## 2018-01-20 MED ORDER — PREDNISONE 10 MG PO TABS: ORAL_TABLET | ORAL | 0 refills | Status: DC

## 2018-01-20 MED ORDER — BUDESONIDE-FORMOTEROL FUMARATE 160-4.5 MCG/ACT IN AERO: 2.0000 | INHALATION_SPRAY | Freq: Two times a day (BID) | RESPIRATORY_TRACT | 12 refills | Status: DC

## 2018-01-20 NOTE — Progress Notes (Signed)
Chief Complaint  Patient presents with  . Acute Visit    increased wheezing for several days, increased SOB, productive cough (clear)    HPI: 77 yo male former smoker with COPD/emphysema.  He is followed by Dr. Melvyn Novas.  He is not on any inhalers.  He was told he couldn't use any inhalers because he has glaucoma, but wasn't clear about whether he couldn't use just some inhalers.  Over the past few weeks he has more sinus congestion and post nasal drip.  He is getting cough with clear sputum, but might have coughed up some blood also.  He isn't sure about this.  He has been getting wheeze from his throat and chest.  He has been feeling more short of breath.  Gets chest discomfort with coughing.  Not having gland swelling, skin rash, leg swelling, GI symptoms, fever.   He  has a past medical history of AAA (abdominal aortic aneurysm) (HCC) (LAST ABD. [name]  JAN 2014  3.4CM), Arthritis, Atrial fibrillation ([place]), Benign essential HTN (01/19/2016), Bilateral carotid artery stenosis (DUPLEX 12-29-2012  BY DR [facility]), BPH (benign prostatic hypertrophy), Cancer ([place]) (03-01-08), Complication of anesthesia, COPD (chronic obstructive pulmonary disease) ([place]), Exertional dyspnea, Frequency of urination, GERD (gastroesophageal reflux disease), Glaucoma (BOTH EYES), History of basal cell carcinoma excision, History of lung cancer (APRIL 2012  SQUAMOUS CELL---- S/P RIGHT LOWER LOBECTOMY AT [facility] --  NO CHEMORADIATION---  NO RECURRENCE ), adenomatous colonic polyps (2005), Hyperlipidemia, Hypertension, Impaired fasting glucose (2007), Lesion of bladder, Microhematuria, Nocturia, OSA (obstructive sleep apnea) (08/29/2015), PAD (peripheral artery disease) ([place]) (ABI'S  JAN 2014  0.65 ON RIGHT ;  1.04 ON LEFT), Peripheral vascular disease ([place]) (S/P ANGIOPLASTY AND STENTING), Spinal stenosis (Sept. 2015), Stroke [facility]) (Jul 07, 2013), and Urgency of urination.  He  has a past surgical history that includes ANGIO PLASTY; colon  polyectomy; trabecular surgery; Lung lobectomy (01/24/2011); Laryngoscopy (06-27-2004); Cataract extraction w/ intraocular lens  implant, bilateral; Excision basal cell carcinoma; Femoral-popliteal Bypass Graft (Left, [place]); Cardiovascular stress test (12-08-2012  DR [facility]); transthoracic echocardiogram (12-29-2012  DR [facility]); Aortogram (07-27-2002); Cystoscopy w/ retrogrades (Bilateral, 01/21/2013); Cystoscopy with biopsy (N/A, 01/21/2013); Endarterectomy (Right, 07/14/2013); Patch angioplasty (Right, 07/14/2013); Carotid endarterectomy (Right, 07-14-13); carotid angiogram (N/A, 07/10/2013); Cardiac catheterization (N/A, 08/12/2015); Cardiac catheterization (N/A, 08/29/2015); lower extremity angiogram (Bilateral, 08/29/2015); Femoral-popliteal Bypass Graft (Left, 08/30/2015); Patch angioplasty (Left, 08/30/2015); and LEFT HEART CATH AND CORONARY ANGIOGRAPHY (N/A, 08/20/2017).  His family history includes Alcohol abuse in his father; Heart attack in his father; Heart disease in his father, paternal aunt, and paternal uncle; Hypertension in his father and paternal aunt; Stroke in his father, mother, paternal aunt, and paternal uncle.  He  reports that he quit smoking about 6 years ago. His smoking use included cigarettes. He has a 25.00 pack-year smoking history. He has never used smokeless tobacco. He reports that he drinks alcohol. He reports that he does not use drugs.   Vital signs: BP 124/70 (BP Location: Left Arm, Cuff Size: Normal)   Pulse 69   Ht 6' 1.5" (1.867 m)   Wt 202 lb 6.4 oz (91.8 kg)   SpO2 95%   BMI 26.34 kg/m   Physical exam:  General - pleasant Eyes - pupils reactive ENT - no sinus tenderness, no oral exudate, no LAN Cardiac - regular, no murmur Chest - no wheeze, rales Abd - soft, non tender Ext - no edema Skin - no rashes Neuro - normal strength Psych - normal mood  Assessment/plan:  Acute asthmatic bronchitis with allergic rhinitis triggering COPD  exacerbation with hx of emphysema. - will add symbicort, singulair for now - prednisone taper - ?hemoptysis >> will get chest xray today - add nasal irrigation - continue flonase and OTC antihistamine   Allergies as of 01/20/2018      Reactions   Penicillins Hives, Shortness Of Breath, Other (See Comments)   Flushing  & dyspnea Because of a history of documented adverse serious drug reaction;Medi Alert bracelet  is recommended PCN reaction causing immediate rash, facial/tongue/throat swelling, SOB or lightheadedness with hypotension: Yes PCN reaction causing severe rash involving mucus membranes or skin necrosis: No PCN reaction occurring within the last 10 years: NO PCN reaction that required hospitalization: NO   Sulfonamide Derivatives Hives, Shortness Of Breath, Other (See Comments)   Flushing & dyspnea Because of a history of documented adverse serious drug reaction;Medi Alert bracelet  is recommended   Niacin-lovastatin Er Other (See Comments)   Dyspnea, flushing   Atorvastatin Other (See Comments)   Myalgias & athralgias- takes Crestor      Medication List        Accurate as of 01/20/18  5:11 PM. Always use your most recent med list.          acetaminophen 325 MG tablet Commonly known as:  TYLENOL Take 650 mg every 6 (six) hours as needed by mouth for moderate pain or headache.   amiodarone 200 MG tablet Commonly known as:  PACERONE TAKE 1/2 TABLET EVERY DAY.   bimatoprost 0.01 % Soln Commonly known as:  LUMIGAN Place 1 drop at bedtime into both eyes.   Biotin 10000 MCG Tabs Take 10,000 mcg daily by mouth.   budesonide-formoterol 160-4.5 MCG/ACT inhaler Commonly known as:  SYMBICORT Inhale 2 puffs into the lungs 2 (two) times daily.   CARDURA 4 MG tablet Generic drug:  doxazosin TAKE (1/2) TABLET DAILY.   diltiazem 120 MG 24 hr capsule Commonly known as:  CARDIZEM CD TAKE 1 CAPSULE DAILY.   dorzolamide-timolol 22.3-6.8 MG/ML ophthalmic  solution Commonly known as:  COSOPT Place 2 drops 2 (two) times daily into the right eye.   ELIQUIS 5 MG Tabs tablet Generic drug:  apixaban TAKE 1 TABLET BY MOUTH TWICE DAILY.   FLUoxetine 20 MG capsule Commonly known as:  PROZAC TAKE (1) CAPSULE DAILY.   furosemide 40 MG tablet Commonly known as:  LASIX TAKE 1/2 TABLET (20MG ) DAILY ALTERNATING WITH 1 TABLET (40MG ).   Melatonin 5 MG Tabs Take 5 mg by mouth at bedtime.   montelukast 10 MG tablet Commonly known as:  SINGULAIR Take 1 tablet (10 mg total) by mouth at bedtime.   multivitamin with minerals Tabs tablet Take 1 tablet by mouth daily. Centrum Silver   POLY-IRON 150 150 MG capsule Generic drug:  iron polysaccharides Take 150 mg by mouth daily.   potassium chloride SA 20 MEQ tablet Commonly known as:  K-DUR,KLOR-CON TAKE 1 TABLET ONCE DAILY.   predniSONE 10 MG tablet Commonly known as:  DELTASONE 3 pills daily for 2 days, 2 pills daily for 2 days, 1 pill daily for 2 days   ranitidine 150 MG tablet Commonly known as:  ZANTAC Take 150 mg by mouth daily as needed for heartburn.   rosuvastatin 40 MG tablet Commonly known as:  CRESTOR TAKE 1 TABLET ONCE DAILY.   SYSTANE OP Place 1-2 drops as needed into both eyes (for dry eyes).       Chesley Mires, MD [facility] Pulmonary/Critical Care 01/20/2018, 5:17  PM   

## 2018-01-20 NOTE — Patient Instructions (Addendum)
Chest xray today NielMed Sinus rinse daily Flonase daily Singulair 10 mg pill nightly Symbicort two puffs twice per day, and rinse mouth after each use Prednisone 10 mg pill > 3 pills daily for 2 days, 2 pills daily for 2 days, 1 pill daily for 2 days  Follow up in 2 weeks with Dr. Melvyn Novas or Nurse Practitioner

## 2018-01-21 ENCOUNTER — Encounter (HOSPITAL_COMMUNITY): Payer: Commercial Managed Care - PPO

## 2018-01-21 ENCOUNTER — Telehealth: Payer: Self-pay | Admitting: Pulmonary Disease

## 2018-01-21 ENCOUNTER — Other Ambulatory Visit (HOSPITAL_COMMUNITY): Payer: Self-pay | Admitting: Cardiology

## 2018-01-21 NOTE — Telephone Encounter (Signed)
Dg Chest 2 View  Result Date: 01/21/2018 CLINICAL DATA:  Cough, chest congestion, shortness of breath for the past 4 5 days. History of COPD. Former smoker. History of right lower lobectomy and postsurgical fibrothorax. EXAM: CHEST - 2 VIEW COMPARISON:  CT scan of the chest of August 13, 2017 and chest x-ray of June 22, 2015 FINDINGS: There is chronic volume loss on the right. The pleural thickening at the right lung base is stable. The interstitial markings are coarse in the aerated portion of the lower right lung and are more conspicuous than in the past. The left lung is well-expanded. The interstitial markings are coarse. The heart is normal in size. The bony thorax exhibits no acute abnormality. IMPRESSION: Chronic postsurgical volume loss at the right lung base. Possible superimposed subsegmental atelectasis or interstitial pneumonia at the right base. No left-sided pneumonia. Underlying COPD. No CHF. Followup PA and lateral chest X-ray is recommended in 3-4 weeks following trial of antibiotic therapy to ensure resolution and exclude underlying malignancy. Electronically Signed   By: David  Martinique M.D.   On: 01/21/2018 08:40    Results d/w pt and his wife.  He is feeling better.  Still has some cough, wheeze, and sputum >> all are improved compared to 01/20/18.   Don't think he needs ABx at this time.  Advised him to call if his symptoms get worse.  He has ROV with Tammy Parrett on 02/04/18.

## 2018-01-22 ENCOUNTER — Telehealth: Payer: Self-pay | Admitting: Internal Medicine

## 2018-01-22 MED ORDER — DOXYCYCLINE HYCLATE 100 MG PO TABS: 100.0000 mg | ORAL_TABLET | Freq: Two times a day (BID) | ORAL | 0 refills | Status: DC

## 2018-01-22 NOTE — Telephone Encounter (Signed)
Called pts spouse to make aware of recs. There is a drug interaction with zpak and pt's amiodarone.  MW please advise.

## 2018-01-22 NOTE — Telephone Encounter (Signed)
Spoke with patient's wife [name]. She stated that the patient had a CXR on 4/22 that was ordered by Dr. Halford Chessman. She wants Dr. Melvyn Novas to review this CXR as well to see if he has any recommendations.   Dr. Melvyn Novas, please advise on the CXR that was done on 4/22. Thanks!

## 2018-01-22 NOTE — Telephone Encounter (Signed)
I meantime I talked to Dr Halford Chessman and not sure he actually needs the abx but if any worse symptoms, fever, nasty mucus go doxy x 100 mg bid x 10days and f/u with cxr regardless at 2 weeks

## 2018-01-22 NOTE — Telephone Encounter (Signed)
Spoke with [employer]. She is aware of MW's recs. She wishes to have the doxy called in anyways since his cough is not better.   Will go ahead and call in medication. Nothing else needed at time of call.

## 2018-01-22 NOTE — Telephone Encounter (Signed)
It's possible there is a small area of pna there but I can't tell for sure   Would probably err on side of caution and rec  zpak  F/u cxr in 2 weeks - call sooner if not improving symptoms

## 2018-01-23 ENCOUNTER — Encounter (HOSPITAL_COMMUNITY): Payer: Commercial Managed Care - PPO

## 2018-01-28 ENCOUNTER — Encounter (HOSPITAL_COMMUNITY): Payer: Commercial Managed Care - PPO

## 2018-01-29 ENCOUNTER — Other Ambulatory Visit: Payer: Self-pay

## 2018-01-29 ENCOUNTER — Encounter (HOSPITAL_COMMUNITY): Payer: Self-pay

## 2018-01-29 ENCOUNTER — Observation Stay (HOSPITAL_COMMUNITY)
Admission: EM | Admit: 2018-01-29 | Discharge: 2018-02-01 | Disposition: A | Discharge status: Home / Self Care | Payer: Commercial Managed Care - PPO | Attending: Internal Medicine | Admitting: Internal Medicine

## 2018-01-29 ENCOUNTER — Telehealth: Payer: Self-pay | Admitting: Internal Medicine

## 2018-01-29 ENCOUNTER — Emergency Department (HOSPITAL_COMMUNITY): Payer: Commercial Managed Care - PPO

## 2018-01-29 DIAGNOSIS — I951 Orthostatic hypotension: Secondary | ICD-10-CM | POA: Diagnosis not present

## 2018-01-29 DIAGNOSIS — R55 Syncope and collapse: Secondary | ICD-10-CM | POA: Diagnosis not present

## 2018-01-29 DIAGNOSIS — I2510 Atherosclerotic heart disease of native coronary artery without angina pectoris: Secondary | ICD-10-CM | POA: Diagnosis not present

## 2018-01-29 DIAGNOSIS — E785 Hyperlipidemia, unspecified: Secondary | ICD-10-CM | POA: Diagnosis not present

## 2018-01-29 DIAGNOSIS — S0081XA Abrasion of other part of head, initial encounter: Principal | ICD-10-CM | POA: Insufficient documentation

## 2018-01-29 DIAGNOSIS — Y939 Activity, unspecified: Secondary | ICD-10-CM | POA: Insufficient documentation

## 2018-01-29 DIAGNOSIS — S0990XA Unspecified injury of head, initial encounter: Secondary | ICD-10-CM

## 2018-01-29 DIAGNOSIS — Y92009 Unspecified place in unspecified non-institutional (private) residence as the place of occurrence of the external cause: Secondary | ICD-10-CM | POA: Diagnosis not present

## 2018-01-29 DIAGNOSIS — W19XXXA Unspecified fall, initial encounter: Secondary | ICD-10-CM

## 2018-01-29 DIAGNOSIS — N183 Chronic kidney disease, stage 3 (moderate): Secondary | ICD-10-CM | POA: Insufficient documentation

## 2018-01-29 DIAGNOSIS — I130 Hypertensive heart and chronic kidney disease with heart failure and stage 1 through stage 4 chronic kidney disease, or unspecified chronic kidney disease: Secondary | ICD-10-CM | POA: Diagnosis not present

## 2018-01-29 DIAGNOSIS — Y999 Unspecified external cause status: Secondary | ICD-10-CM | POA: Diagnosis not present

## 2018-01-29 DIAGNOSIS — I4891 Unspecified atrial fibrillation: Secondary | ICD-10-CM | POA: Diagnosis not present

## 2018-01-29 DIAGNOSIS — J449 Chronic obstructive pulmonary disease, unspecified: Secondary | ICD-10-CM | POA: Insufficient documentation

## 2018-01-29 DIAGNOSIS — W0110XA Fall on same level from slipping, tripping and stumbling with subsequent striking against unspecified object, initial encounter: Secondary | ICD-10-CM | POA: Diagnosis not present

## 2018-01-29 DIAGNOSIS — Z8679 Personal history of other diseases of the circulatory system: Secondary | ICD-10-CM | POA: Diagnosis not present

## 2018-01-29 DIAGNOSIS — I5032 Chronic diastolic (congestive) heart failure: Secondary | ICD-10-CM | POA: Insufficient documentation

## 2018-01-29 LAB — CBC WITH DIFFERENTIAL/PLATELET
BASOS PCT: 0 %
Basophils Absolute: 0 10*3/uL (ref 0.0–0.1)
Eosinophils Absolute: 0 10*3/uL (ref 0.0–0.7)
Eosinophils Relative: 0 %
HEMATOCRIT: 41.9 % (ref 39.0–52.0)
HEMOGLOBIN: 13.5 g/dL (ref 13.0–17.0)
LYMPHS PCT: 14 %
Lymphs Abs: 1.4 10*3/uL (ref 0.7–4.0)
MCH: 28.7 pg (ref 26.0–34.0)
MCHC: 32.2 g/dL (ref 30.0–36.0)
MCV: 89.1 fL (ref 78.0–100.0)
MONO ABS: 0.6 10*3/uL (ref 0.1–1.0)
Monocytes Relative: 6 %
NEUTROS PCT: 80 %
Neutro Abs: 8 10*3/uL — ABNORMAL HIGH (ref 1.7–7.7)
Platelets: 250 10*3/uL (ref 150–400)
RBC: 4.7 MIL/uL (ref 4.22–5.81)
RDW: 14.8 % (ref 11.5–15.5)
WBC: 10 10*3/uL (ref 4.0–10.5)

## 2018-01-29 LAB — I-STAT TROPONIN, ED: TROPONIN I, POC: 0.01 ng/mL (ref 0.00–0.08)

## 2018-01-29 LAB — COMPREHENSIVE METABOLIC PANEL
ALBUMIN: 3.2 g/dL — AB (ref 3.5–5.0)
ALT: 37 U/L (ref 17–63)
AST: 30 U/L (ref 15–41)
Alkaline Phosphatase: 60 U/L (ref 38–126)
Anion gap: 9 (ref 5–15)
BILIRUBIN TOTAL: 0.6 mg/dL (ref 0.3–1.2)
BUN: 29 mg/dL — AB (ref 6–20)
CO2: 27 mmol/L (ref 22–32)
CREATININE: 1.54 mg/dL — AB (ref 0.61–1.24)
Calcium: 8.7 mg/dL — ABNORMAL LOW (ref 8.9–10.3)
Chloride: 103 mmol/L (ref 101–111)
GFR calc Af Amer: 48 mL/min — ABNORMAL LOW (ref >60)
GFR, EST NON AFRICAN AMERICAN: 42 mL/min — AB (ref >60)
GLUCOSE: 116 mg/dL — AB (ref 65–99)
POTASSIUM: 4 mmol/L (ref 3.5–5.1)
Sodium: 139 mmol/L (ref 135–145)
Total Protein: 6.2 g/dL — ABNORMAL LOW (ref 6.5–8.1)

## 2018-01-29 LAB — URINALYSIS, ROUTINE W REFLEX MICROSCOPIC
Bilirubin Urine: NEGATIVE
GLUCOSE, UA: NEGATIVE mg/dL
Hgb urine dipstick: NEGATIVE
Ketones, ur: NEGATIVE mg/dL
LEUKOCYTES UA: NEGATIVE
NITRITE: NEGATIVE
PH: 5 (ref 5.0–8.0)
Protein, ur: NEGATIVE mg/dL
SPECIFIC GRAVITY, URINE: 1.019 (ref 1.005–1.030)

## 2018-01-29 LAB — BRAIN NATRIURETIC PEPTIDE: B Natriuretic Peptide: 341.7 pg/mL — ABNORMAL HIGH (ref 0.0–100.0)

## 2018-01-29 MED ORDER — FUROSEMIDE 20 MG PO TABS
20.0000 mg | ORAL_TABLET | Freq: Every day | ORAL | Status: DC
  Administered 2018-01-30: 20 mg via ORAL
  Filled 2018-01-29: qty 1

## 2018-01-29 MED ORDER — DILTIAZEM HCL ER COATED BEADS 120 MG PO CP24
120.0000 mg | ORAL_CAPSULE | Freq: Every day | ORAL | Status: DC
  Administered 2018-01-30: 120 mg via ORAL
  Filled 2018-01-29: qty 1

## 2018-01-29 MED ORDER — DORZOLAMIDE HCL-TIMOLOL MAL 2-0.5 % OP SOLN
2.0000 [drp] | Freq: Two times a day (BID) | OPHTHALMIC | Status: DC
  Administered 2018-01-30 – 2018-02-01 (×5): 2 [drp] via OPHTHALMIC
  Filled 2018-01-29: qty 10

## 2018-01-29 MED ORDER — POTASSIUM CHLORIDE CRYS ER 20 MEQ PO TBCR
20.0000 meq | EXTENDED_RELEASE_TABLET | Freq: Every day | ORAL | Status: DC
  Administered 2018-01-30 – 2018-02-01 (×3): 20 meq via ORAL
  Filled 2018-01-29 (×3): qty 1

## 2018-01-29 MED ORDER — ONDANSETRON HCL 4 MG/2ML IJ SOLN: 4.0000 mg | Freq: Four times a day (QID) | INTRAMUSCULAR | Status: DC | PRN

## 2018-01-29 MED ORDER — MELATONIN 3 MG PO TABS
3.0000 mg | ORAL_TABLET | Freq: Every day | ORAL | Status: DC
  Administered 2018-01-29 – 2018-01-31 (×3): 3 mg via ORAL
  Filled 2018-01-29 (×3): qty 1

## 2018-01-29 MED ORDER — MOMETASONE FURO-FORMOTEROL FUM 200-5 MCG/ACT IN AERO
2.0000 | INHALATION_SPRAY | Freq: Two times a day (BID) | RESPIRATORY_TRACT | Status: DC
  Administered 2018-01-30 – 2018-02-01 (×4): 2 via RESPIRATORY_TRACT
  Filled 2018-01-29: qty 8.8

## 2018-01-29 MED ORDER — FAMOTIDINE 20 MG PO TABS
20.0000 mg | ORAL_TABLET | Freq: Every day | ORAL | Status: DC
Start: 2018-01-30 — End: 2018-02-01
  Administered 2018-01-30 – 2018-02-01 (×3): 20 mg via ORAL
  Filled 2018-01-29 (×3): qty 1

## 2018-01-29 MED ORDER — FLUTICASONE PROPIONATE 50 MCG/ACT NA SUSP
2.0000 | Freq: Every day | NASAL | Status: DC
  Administered 2018-01-30: 2 via NASAL
  Filled 2018-01-29: qty 16

## 2018-01-29 MED ORDER — ADULT MULTIVITAMIN W/MINERALS CH
1.0000 | ORAL_TABLET | Freq: Every day | ORAL | Status: DC
  Administered 2018-01-30 – 2018-02-01 (×3): 1 via ORAL
  Filled 2018-01-29 (×3): qty 1

## 2018-01-29 MED ORDER — FLUOXETINE HCL 20 MG PO CAPS
20.0000 mg | ORAL_CAPSULE | Freq: Every day | ORAL | Status: DC
  Administered 2018-01-30 – 2018-02-01 (×3): 20 mg via ORAL
  Filled 2018-01-29 (×3): qty 1

## 2018-01-29 MED ORDER — ACETAMINOPHEN 650 MG RE SUPP: 650.0000 mg | Freq: Four times a day (QID) | RECTAL | Status: DC | PRN

## 2018-01-29 MED ORDER — APIXABAN 5 MG PO TABS
5.0000 mg | ORAL_TABLET | Freq: Two times a day (BID) | ORAL | Status: DC
  Administered 2018-01-29 – 2018-02-01 (×6): 5 mg via ORAL
  Filled 2018-01-29 (×6): qty 1

## 2018-01-29 MED ORDER — ONDANSETRON HCL 4 MG PO TABS: 4.0000 mg | ORAL_TABLET | Freq: Four times a day (QID) | ORAL | Status: DC | PRN

## 2018-01-29 MED ORDER — ACETAMINOPHEN 325 MG PO TABS: 650.0000 mg | ORAL_TABLET | Freq: Four times a day (QID) | ORAL | Status: DC | PRN

## 2018-01-29 MED ORDER — SODIUM CHLORIDE 0.9% FLUSH
3.0000 mL | Freq: Two times a day (BID) | INTRAVENOUS | Status: DC
  Administered 2018-01-29 – 2018-02-01 (×6): 3 mL via INTRAVENOUS

## 2018-01-29 MED ORDER — SODIUM CHLORIDE 0.9 % IV BOLUS
500.0000 mL | Freq: Once | INTRAVENOUS | Status: AC
  Administered 2018-01-29: 500 mL via INTRAVENOUS

## 2018-01-29 MED ORDER — DOXAZOSIN MESYLATE 2 MG PO TABS
2.0000 mg | ORAL_TABLET | Freq: Every day | ORAL | Status: DC
  Administered 2018-01-30 – 2018-01-31 (×2): 2 mg via ORAL
  Filled 2018-01-29 (×2): qty 1

## 2018-01-29 MED ORDER — POLYSACCHARIDE IRON COMPLEX 150 MG PO CAPS
150.0000 mg | ORAL_CAPSULE | Freq: Every day | ORAL | Status: DC
  Administered 2018-01-30 – 2018-02-01 (×3): 150 mg via ORAL
  Filled 2018-01-29 (×3): qty 1

## 2018-01-29 MED ORDER — AMIODARONE HCL 100 MG PO TABS
100.0000 mg | ORAL_TABLET | Freq: Every day | ORAL | Status: DC
  Administered 2018-01-30 – 2018-02-01 (×3): 100 mg via ORAL
  Filled 2018-01-29 (×3): qty 1

## 2018-01-29 MED ORDER — POLYVINYL ALCOHOL 1.4 % OP SOLN
OPHTHALMIC | Status: DC | PRN
  Filled 2018-01-29: qty 15

## 2018-01-29 MED ORDER — ROSUVASTATIN CALCIUM 20 MG PO TABS
40.0000 mg | ORAL_TABLET | Freq: Every day | ORAL | Status: DC
  Administered 2018-01-29 – 2018-02-01 (×4): 40 mg via ORAL
  Filled 2018-01-29 (×4): qty 2

## 2018-01-29 MED ORDER — MONTELUKAST SODIUM 10 MG PO TABS
10.0000 mg | ORAL_TABLET | Freq: Every day | ORAL | Status: DC
  Administered 2018-01-29 – 2018-01-31 (×3): 10 mg via ORAL
  Filled 2018-01-29 (×3): qty 1

## 2018-01-29 MED ORDER — LATANOPROST 0.005 % OP SOLN
1.0000 [drp] | Freq: Every day | OPHTHALMIC | Status: DC
  Administered 2018-01-29 – 2018-01-31 (×3): 1 [drp] via OPHTHALMIC
  Filled 2018-01-29: qty 2.5

## 2018-01-29 MED ORDER — ACETAMINOPHEN 325 MG PO TABS
650.0000 mg | ORAL_TABLET | Freq: Four times a day (QID) | ORAL | Status: DC | PRN
  Administered 2018-01-30 – 2018-01-31 (×2): 650 mg via ORAL
  Filled 2018-01-29 (×2): qty 2

## 2018-01-29 MED ORDER — SODIUM CHLORIDE 0.9 % IV SOLN: 250.0000 mL | INTRAVENOUS | Status: DC | PRN

## 2018-01-29 MED ORDER — HYDROCODONE-ACETAMINOPHEN 5-325 MG PO TABS: 1.0000 | ORAL_TABLET | ORAL | Status: DC | PRN

## 2018-01-29 MED ORDER — SODIUM CHLORIDE 0.9% FLUSH: 3.0000 mL | INTRAVENOUS | Status: DC | PRN

## 2018-01-29 NOTE — ED Notes (Signed)
Pt has an abrasion about 2 cm wide and about 9 cm in length above his left eyebrow on his forehead. There is no bleeding at this time.

## 2018-01-29 NOTE — ED Notes (Signed)
Pt has abrasion on forehead, cleaned with normal saline and bandage applied.

## 2018-01-29 NOTE — Telephone Encounter (Signed)
If not better off doxy by 01/30/18 needs ov NP or add on to me but no other changes to rx over the phone

## 2018-01-29 NOTE — ED Provider Notes (Signed)
[facility] EMERGENCY DEPARTMENT Provider Note   CSN: [id number] Arrival date & time: 01/29/18  1540     History   Chief Complaint Chief Complaint  Patient presents with  . Fall    On blood thinners     HPI Troy Macleod [name] is a 77 y.o. male.  Patient with history of lung cancer, COPD, followed by pulmonary, history of known abdominal aortic aneurysm being monitored, history of atrial fibrillation on Eliquis, chronic diastolic heart failure on Lasix, chronic kidney disease --presents with complaint of head injury today after a fall.  Patient reports approximately 2-week history of respiratory illness with cough, shortness of breath, wheezing.  He was started on doxycycline.  Patient had increasing shortness of breath and generalized weakness after starting this and family was concerned that this was a reaction to the medication.  Family notes improvement in symptoms yesterday and today as patient has seemed less fatigued and more alert. However patient fell twice today after getting up and ambulate into the restroom.  He was able to stand up and go a few steps before feeling very lightheaded and falling to the floor.  Just prior to arrival, patient struck the left side of his forehead and sustained a large abrasion.  This prompted emergency department visit today.  Patient is currently in pulmonary rehab and states that he was doing well until recently.  Patient has had a tremor in his upper extremity that family notes to be worse over the past few weeks.  Patient is ambulatory at baseline.  The onset of this condition was acute. Aggravating factors: none. Alleviating factors: none.       Past Medical History:  Diagnosis Date  . AAA (abdominal aortic aneurysm) (HCC) LAST ABD. [name]  JAN 2014  3.4CM   MONITORED BY DR [name]  . Arthritis   . Atrial fibrillation ([place])   . Benign essential HTN 01/19/2016  . Bilateral carotid artery stenosis DUPLEX 12-29-2012  BY DR [facility]   BILATERAL ICA STENOSIS 60-79%  . BPH (benign prostatic hypertrophy)   . Cancer ([place]) 01-25-12   LUNG  . Complication of anesthesia    had nausea after carotid artery surgery, states the medicine given to help the nausea, made it worse  . COPD (chronic obstructive pulmonary disease) ([place])   . Exertional dyspnea   . Frequency of urination   . GERD (gastroesophageal reflux disease)   . Glaucoma BOTH EYES   Dr Gershon Crane  . History of basal cell carcinoma excision   . History of lung cancer APRIL 2012  SQUAMOUS CELL---- S/P RIGHT LOWER LOBECTOMY AT [facility] --  NO CHEMORADIATION---  NO RECURRENCE    ONCOLOGIST- DR Tressie Stalker  LOV IN [facility] 10-27-2012  . Hx of adenomatous colonic polyps 2005    X 2; 1 hyperplastic polyp; Dr Olevia Perches  . Hyperlipidemia   . Hypertension   . Impaired fasting glucose 2007   108; A1c5.4%  . Lesion of bladder   . Microhematuria   . Nocturia   . OSA (obstructive sleep apnea) 08/29/2015   Mild with AHI overall 6/hr but severe during REM sleep with AHI 30/hr with oxygen saturations < 88% for 5.5 minutes of total sleep time.    [name] PAD (peripheral artery disease) ([place]) ABI'S  JAN 2014  0.65 ON RIGHT ;  1.04 ON LEFT  . Peripheral vascular disease ([place]) S/P ANGIOPLASTY AND STENTING   FOLLOWED  BY DR [name]  . Spinal stenosis Sept. 2015  . Stroke [facility])  Jul 07, 2013   mini  TIA  . Urgency of urination     Patient Active Problem List   Diagnosis Date Noted  . Chronic diastolic CHF (congestive heart failure) ([place]) 05/04/2017  . CAD (coronary artery disease) 05/03/2017  . MGUS (monoclonal gammopathy of unknown significance) 03/07/2016  . Benign essential HTN 01/19/2016  . Atherosclerotic peripheral vascular disease ([place]) 08/29/2015  . OSA (obstructive sleep apnea) 08/29/2015  . PAF (paroxysmal atrial fibrillation) ([place])   . Glaucoma 06/22/2015  . Dyspnea 02/04/2015  . Memory loss, short term 08/03/2014  . Pain of right lower leg- and Left Thigh and leg 07/28/2014  .  Anemia, unspecified 12/28/2013  . Carotid stenosis 07/24/2013  . Occlusion and stenosis of carotid artery without mention of cerebral infarction 07/09/2013  . COPD GOLD II  07/09/2013  . Chronic renal disease, stage 3, moderately decreased glomerular filtration rate between 30-59 mL/min/1.73 square meter (HCC) 07/06/2013  . TIA (transient ischemic attack) 07/06/2013  . Chest pain 12/01/2012  . Fatigue 12/01/2012  . Right carotid bruit 12/01/2012  . Compulsive tobacco user syndrome 10/16/2012  . Basal cell carcinoma of skin 10/10/2012  . History of lung cancer 03/11/2012  . Lung nodule, multiple 03/11/2012  . PAD (peripheral artery disease) ([place]) 12/16/2011  . AAA (abdominal aortic aneurysm) ([place]) 09/27/2011  . Squamous cell lung cancer ([place]) 11/08/2010  . GAIT IMBALANCE 03/02/2009  . Prediabetes 03/02/2009  . COLONIC POLYPS, HX OF 03/02/2009  . OBSTRUCTIVE CHRONIC BRONCHITIS WITHOUT EXACERBAT 01/15/2008  . HYPERPLASIA PROSTATE UNS W/O UR OBST & OTH LUTS 01/15/2008  . HYPERLIPIDEMIA 09/29/2007  . Peripheral vascular disease ([place]) 09/29/2007  . LEUKOPLAKIA, VOCAL CORDS 09/29/2007  . FEMORAL BRUIT 09/29/2007    Past Surgical History:  Procedure Laterality Date  . ANGIO PLASTY     X 4 in legs  . AORTOGRAM  07-27-2002   MILD DIFFUSE ILIAC ARTERY OCCLUSIVE DISEASE /  LEFT RENAL ARTERY 20%/ PATENT LEFT FEM-POP GRAFT/ MILD SFA AND POPLITEAL ARTERY OCCLUSIVE DISEASE W/ SEVERE KIDNEY OCCLUSIVE DISEASE  . BASAL CELL CARCINOMA EXCISION     MULTIPLE TIMES--  RIGHT FOREARM, CHEEKS, AND BACK  . CARDIOVASCULAR STRESS TEST  12-08-2012  DR MCLEAN   NORMAL LEXISCAN WITH NO EXERCISE NUCLEAR STUDY/ EF 66%/   NO ISCHEMIA/ NO SIGNIFICANT CHANGE FROM PRIOR STUDY  . CAROTID ANGIOGRAM N/A 07/10/2013   Procedure: CAROTID ANGIOGRAM;  Surgeon: Elam Dutch, MD;  Location: [facility] CATH LAB;  Service: Cardiovascular;  Laterality: N/A;  . CAROTID ENDARTERECTOMY Right 07-14-13   cea  . CATARACT EXTRACTION W/  INTRAOCULAR LENS  IMPLANT, BILATERAL    . colon polyectomy    . CYSTOSCOPY W/ RETROGRADES Bilateral 01/21/2013   Procedure: CYSTOSCOPY WITH RETROGRADE PYELOGRAM;  Surgeon: Molli Hazard, MD;  Location: [facility];  Service: Urology;  Laterality: Bilateral;   CYSTO, BLADDER BIOPSY, BILATERAL RETROGRADE PYELOGRAM  RAD TECH FROM RADIOLOGY PER JOY  . CYSTOSCOPY WITH BIOPSY N/A 01/21/2013   Procedure: CYSTOSCOPY WITH BIOPSY;  Surgeon: Molli Hazard, MD;  Location: [facility];  Service: Urology;  Laterality: N/A;  . ENDARTERECTOMY Right 07/14/2013   Procedure: ENDARTERECTOMY CAROTID;  Surgeon: Angelia Mould, MD;  Location: [facility];  Service: Vascular;  Laterality: Right;  . EP IMPLANTABLE DEVICE N/A 08/12/2015   Procedure: Loop Recorder Insertion;  Surgeon: Thompson Grayer, MD;  Location: [facility] CV LAB;  Service: Cardiovascular;  Laterality: N/A;  . FEMORAL-POPLITEAL BYPASS GRAFT Left [place]  AND 2001  IN [place]  . FEMORAL-POPLITEAL BYPASS GRAFT Left 08/30/2015   Procedure: REVISION OF BYPASS GRAFT Left  FEMORAL-POPLITEAL ARTERY;  Surgeon: Angelia Mould, MD;  Location: [facility];  Service: Vascular;  Laterality: Left;  . LARYNGOSCOPY  06-27-2004   BX VOCAL CORD  (LEUKOPLAKIA)  PER PT NO ISSUES SINCE  . LEFT HEART CATH AND CORONARY ANGIOGRAPHY N/A 08/20/2017   Procedure: LEFT HEART CATH AND CORONARY ANGIOGRAPHY;  Surgeon: Larey Dresser, MD;  Location: [facility] CV LAB;  Service: Cardiovascular;  Laterality: N/A;  . LOWER EXTREMITY ANGIOGRAM Bilateral 08/29/2015   Procedure: Lower Extremity Angiogram;  Surgeon: Angelia Mould, MD;  Location: [facility] CV LAB;  Service: Cardiovascular;  Laterality: Bilateral;  . LUNG LOBECTOMY  01/24/2011    RIGHT UPPER LOBE  (SQUAMOUS CELL CARCINOMA) Dr Dorthea Cove , [facility]. No chemotherapyor radiation  . PATCH ANGIOPLASTY Right 07/14/2013   Procedure: PATCH ANGIOPLASTY;  Surgeon:  Angelia Mould, MD;  Location: [facility];  Service: Vascular;  Laterality: Right;  . PATCH ANGIOPLASTY Left 08/30/2015   Procedure: VEIN PATCH ANGIOPLASTY OF PROXIMAL Left BYPASS GRAFT;  Surgeon: Angelia Mould, MD;  Location: [facility];  Service: Vascular;  Laterality: Left;  . PERIPHERAL VASCULAR CATHETERIZATION N/A 08/29/2015   Procedure: Abdominal Aortogram;  Surgeon: Angelia Mould, MD;  Location: [facility] CV LAB;  Service: Cardiovascular;  Laterality: N/A;  . trabecular surgery     OS  . TRANSTHORACIC ECHOCARDIOGRAM  12-29-2012  DR [name]   MILD LVH/  LVSF NORMAL/ EF 29-79%/  GRADE I DIASTOLIC DYSFUNCTION        Home Medications    Prior to Admission medications   Medication Sig Start Date End Date Taking? Authorizing Provider  acetaminophen (TYLENOL) 325 MG tablet Take 650 mg every 6 (six) hours as needed by mouth for moderate pain or headache.    [provider]  amiodarone (PACERONE) 200 MG tablet TAKE 1/2 TABLET EVERY DAY. 01/21/18   Larey Dresser, MD  bimatoprost (LUMIGAN) 0.01 % SOLN Place 1 drop at bedtime into both eyes.     [provider]  Biotin 10000 MCG TABS Take 10,000 mcg daily by mouth.     [provider]  budesonide-formoterol (SYMBICORT) 160-4.5 MCG/ACT inhaler Inhale 2 puffs into the lungs 2 (two) times daily. 01/20/18   Chesley Mires, MD  CARDURA 4 MG tablet TAKE (1/2) TABLET DAILY. 10/16/17   Binnie Rail, MD  diltiazem (CARDIZEM CD) 120 MG 24 hr capsule TAKE 1 CAPSULE DAILY. 01/08/18   Larey Dresser, MD  dorzolamide-timolol (COSOPT) 22.3-6.8 MG/ML ophthalmic solution Place 2 drops 2 (two) times daily into the right eye.     [provider]  doxycycline (VIBRA-TABS) 100 MG tablet Take 1 tablet (100 mg total) by mouth 2 (two) times daily. 01/22/18   Tanda Rockers, MD  ELIQUIS 5 MG TABS tablet TAKE 1 TABLET BY MOUTH TWICE DAILY. 01/08/18   Larey Dresser, MD  FLUoxetine (PROZAC) 20 MG capsule TAKE (1)  CAPSULE DAILY. 12/09/17   Binnie Rail, MD  furosemide (LASIX) 40 MG tablet TAKE 1/2 TABLET (20MG ) DAILY ALTERNATING WITH 1 TABLET (40MG ). 10/02/17   Larey Dresser, MD  iron polysaccharides (POLY-IRON 150) 150 MG capsule Take 150 mg by mouth daily.    [provider]  Melatonin 5 MG TABS Take 5 mg by mouth at bedtime.    [provider]  montelukast (SINGULAIR) 10 MG tablet Take 1 tablet (10  mg total) by mouth at bedtime. 01/20/18   Chesley Mires, MD  Multiple Vitamin (MULTIVITAMIN WITH MINERALS) TABS tablet Take 1 tablet by mouth daily. Centrum Silver    [provider]  Polyethyl Glycol-Propyl Glycol (SYSTANE OP) Place 1-2 drops as needed into both eyes (for dry eyes).    [provider]  potassium chloride SA (K-DUR,KLOR-CON) 20 MEQ tablet TAKE 1 TABLET ONCE DAILY. 01/08/18   Larey Dresser, MD  predniSONE (DELTASONE) 10 MG tablet 3 pills daily for 2 days, 2 pills daily for 2 days, 1 pill daily for 2 days 01/20/18   Chesley Mires, MD  ranitidine (ZANTAC) 150 MG tablet Take 150 mg by mouth daily as needed for heartburn.     [provider]  rosuvastatin (CRESTOR) 40 MG tablet TAKE 1 TABLET ONCE DAILY. 01/08/18   Larey Dresser, MD    Family History Family History  Problem Relation Age of Onset  . Stroke Mother        mini strokes  . Alcohol abuse Father   . Heart disease Father        MI after 81  . Stroke Father   . Hypertension Father   . Heart attack Father   . Heart disease Paternal Aunt        several  . Hypertension Paternal Aunt        several  . Stroke Paternal Aunt        several  . Stroke Paternal Uncle        several  . Heart disease Paternal Uncle        several;2 had MI pre 30    Social History Social History   Tobacco Use  . Smoking status: Former Smoker    Packs/day: 0.50    Years: 50.00    Pack years: 25.00    Types: Cigarettes    Last attempt to quit: 07/29/2011    Years since quitting: 6.5  . Smokeless  tobacco: Never Used  Substance Use Topics  . Alcohol use: Yes    Alcohol/week: 0.0 oz    Comment:  socially, variable  . Drug use: No     Allergies   Penicillins; Sulfonamide derivatives; Niacin-lovastatin er; and Atorvastatin   Review of Systems Review of Systems  Constitutional: Negative for fever.  HENT: Negative for rhinorrhea and sore throat.   Eyes: Negative for redness.  Respiratory: Positive for shortness of breath (Recently due to respiratory infection, overall improving). Negative for cough.   Cardiovascular: Negative for chest pain and leg swelling.  Gastrointestinal: Negative for abdominal pain, diarrhea, nausea and vomiting.  Genitourinary: Negative for dysuria.  Musculoskeletal: Positive for gait problem. Negative for myalgias and neck pain.  Skin: Negative for rash.  Neurological: Positive for weakness and light-headedness. Negative for headaches.     Physical Exam Updated Vital Signs BP 112/74   Pulse (!) 58   Temp 99 F (37.2 C) (Oral)   Resp 17   SpO2 98%   Physical Exam  Constitutional: He is oriented to person, place, and time. He appears well-developed and well-nourished.  HENT:  Head: Normocephalic. Head is without raccoon's eyes and without Battle's sign.  Right Ear: Tympanic membrane, external ear and ear canal normal. No hemotympanum.  Left Ear: Tympanic membrane, external ear and ear canal normal. No hemotympanum.  Nose: Nose normal. No nasal septal hematoma.  Mouth/Throat: Oropharynx is clear and moist.  Abrasion noted to left forehead with linear superficial, non-gaping laceration.  No active bleeding.  Eyes: Pupils are equal, round, and reactive to light. Conjunctivae, EOM and lids are normal. Right eye exhibits no discharge. Left eye exhibits no discharge.  No visible hyphema  Neck: Normal range of motion. Neck supple.  Cardiovascular: Normal rate, regular rhythm and normal heart sounds.  Pulmonary/Chest: Effort normal and breath sounds  normal.  Abdominal: Soft. There is no tenderness.  Musculoskeletal: Normal range of motion.       Cervical back: He exhibits normal range of motion, no tenderness and no bony tenderness.       Thoracic back: He exhibits no tenderness and no bony tenderness.       Lumbar back: He exhibits no tenderness and no bony tenderness.  Neurological: He is alert and oriented to person, place, and time. He has normal strength and normal reflexes. He displays tremor. No cranial nerve deficit or sensory deficit. He exhibits normal muscle tone. Coordination normal. GCS eye subscore is 4. GCS verbal subscore is 5. GCS motor subscore is 6.  Pill-rolling type tremor left upper extremity.   Skin: Skin is warm and dry.  Psychiatric: He has a normal mood and affect.  Nursing note and vitals reviewed.    ED Treatments / Results  Labs (all labs ordered are listed, but only abnormal results are displayed) Labs Reviewed  CBC WITH DIFFERENTIAL/PLATELET - Abnormal; Notable for the following components:      Result Value   Neutro Abs 8.0 (*)    All other components within normal limits  COMPREHENSIVE METABOLIC PANEL - Abnormal; Notable for the following components:   Glucose, Bld 116 (*)    BUN 29 (*)    Creatinine, Ser 1.54 (*)    Calcium 8.7 (*)    Total Protein 6.2 (*)    Albumin 3.2 (*)    GFR calc non Af Amer 42 (*)    GFR calc Af Amer 48 (*)    All other components within normal limits  BRAIN NATRIURETIC PEPTIDE - Abnormal; Notable for the following components:   B Natriuretic Peptide 341.7 (*)    All other components within normal limits  URINALYSIS, ROUTINE W REFLEX MICROSCOPIC  I-STAT TROPONIN, ED    EKG EKG Interpretation  Date/Time:  Wednesday Jan 29 2018 16:40:04 EDT Ventricular Rate:  73 PR Interval:    QRS Duration: 115 QT Interval:  418 QTC Calculation: 461 R Axis:   43 Text Interpretation:  Suspect atrial fibrillation Nonspecific intraventricular conduction delay Rhythm more  irregular than last ECG Confirmed by Gareth Morgan [phone]) on 01/29/2018 4:59:17 PM   Radiology Dg Chest 2 View  Result Date: 01/29/2018 CLINICAL DATA:  Frequent falls, dyspnea/cough. EXAM: CHEST - 2 VIEW COMPARISON:  01/20/2018 CXR and chest CT 08/13/2017 FINDINGS: The heart size and mediastinal contours are within normal limits. 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 Adjacent bandlike opacity compatible with scarring is seen at the right lung base. Left lung remains clear. No acute osseous abnormality. IMPRESSION: No significant change in the appearance of the chest. Chronic blunting of the right posterolateral costophrenic angle with postop change and scarring at the right lung base. No active pulmonary disease. Electronically Signed   By: Ashley Royalty M.D.   On: 01/29/2018 18:15   Ct Head Wo Contrast  Result Date: 01/29/2018 CLINICAL DATA:  77 y/o  M; fall with abrasion to the forehead. EXAM: CT HEAD WITHOUT CONTRAST TECHNIQUE: Contiguous axial images were obtained from the base  of the skull through the vertex without intravenous contrast. COMPARISON:  06/22/2015 CT head.  07/29/2015 MRI brain. FINDINGS: Brain: No evidence of acute infarction, hemorrhage, hydrocephalus, extra-axial collection or mass lesion/mass effect. Stable chronic microvascular ischemic changes and parenchymal volume loss of the brain given differences in technique. Vascular: Calcific atherosclerosis of carotid siphons and vertebral arteries. No hyperdense vessel. Skull: Normal. Negative for fracture or focal lesion. Sinuses/Orbits: No acute finding. Other: Bilateral glaucoma devices. IMPRESSION: 1. No acute intracranial abnormality or calvarial fracture. 2. Stable chronic microvascular ischemic changes and parenchymal volume loss of the brain. Electronically Signed   By: Kristine Garbe M.D.   On: 01/29/2018 18:37     Procedures Procedures (including critical care time)  Medications Ordered in ED Medications - No data to display   Initial Impression / Assessment and Plan / ED Course  I have reviewed the triage vital signs and the nursing notes.  Pertinent labs & imaging results that were available during my care of the patient were reviewed by me and considered in my medical decision making (see chart for details).     Patient seen and examined. Work-up initiated. Discussed with Dr. Billy Fischer.   Vital signs reviewed and are as follows: BP 100/67   Pulse 69   Temp 99 F (37.2 C) (Oral)   Resp 18   SpO2 99%   Orthostatic VS for the past 24 hrs:  BP- Lying Pulse- Lying BP- Sitting Pulse- Sitting BP- Standing at 0 minutes Pulse- Standing at 0 minutes  01/29/18 1648 106/67 77 (!) 81/53 89 (!) 77/53 107   Patient with severe orthostatic changes with standing.  Fluid bolus ordered.  Patient has a baseline mild anemia, hemoglobin normal today (? Hemoconcentration).  No reported GI bleeding, vomiting, diarrhea.  He does not appear fluid overloaded.  CKD at baseline.  Mild elevation in BNP without signs of fluid overload on exam or chest x-ray.  Do not suspect acute heart failure exacerbation.  Chest x-ray without signs of pneumonia, postsurgical changes noted.  7:37 PM Patient and family updated on results.  Will request observation admission given orthostasis.  7:43 PM Spoke with Dr. Laren Everts who will see.   Final Clinical Impressions(s) / ED Diagnoses   Final diagnoses:  Near syncope  Orthostatic hypotension  Injury of head, initial encounter  Abrasion of forehead, initial encounter  Fall, initial encounter   Admit for symptomatic orthostasis & near syncope causing two falls today, cardiac history.   ED Discharge Orders    None       Carlisle Cater, Hershal Coria 01/29/18 1943    Gareth Morgan, MD 02/01/18 1220

## 2018-01-29 NOTE — ED Notes (Signed)
Yellow band and socks applied to pt.   

## 2018-01-29 NOTE — Telephone Encounter (Signed)
Spoke with pt's wife Troy [name] (dpr on file), is concerned that pt might be having an allergic reaction to doxycycline.  Pt c/o sinus congestion, sob, dizziness, fatigue, nonprod cough worse X 1 day.  Pt has been on doxycycline since 4/24.  Pt was seen on 4/22 by [facility] as an acute pt for sob, sinus congestion, cough.  Using sinus rinse as well to help with s/s.  I advised pt's wife to stop doxy at this time.  Requesting additional recs.  MW please advise.  Thanks.

## 2018-01-29 NOTE — ED Notes (Signed)
Josh PA at bedside

## 2018-01-29 NOTE — Telephone Encounter (Signed)
Called pt and advised message from the provider. Pt understood and verbalized understanding. Nothing further is needed.   She will call in the morning.

## 2018-01-29 NOTE — H&P (Signed)
[facility]                                                                                    Patient Demographics  Troy [name], is a 77 y.o. male  CSN: [id number]  MRN: [id number]  DOB - 08-11-1941  Admit Date - 01/29/2018  Outpatient Primary MD for the patient is Binnie Rail, MD   With History of -  Past Medical History:  Diagnosis Date  . AAA (abdominal aortic aneurysm) (HCC) LAST ABD. [name]  JAN 2014  3.4CM   MONITORED BY DR [name]  . Arthritis   . Atrial fibrillation ([place])   . Benign essential HTN 01/19/2016  . Bilateral carotid artery stenosis DUPLEX 12-29-2012  BY DR [facility]   BILATERAL ICA STENOSIS 60-79%  . BPH (benign prostatic hypertrophy)   . Cancer ([place]) 01-25-12   LUNG  . Complication of anesthesia    had nausea after carotid artery surgery, states the medicine given to help the nausea, made it worse  . COPD (chronic obstructive pulmonary disease) ([place])   . Exertional dyspnea   . Frequency of urination   . GERD (gastroesophageal reflux disease)   . Glaucoma BOTH EYES   Dr Gershon Crane  . History of basal cell carcinoma excision   . History of lung cancer APRIL 2012  SQUAMOUS CELL---- S/P RIGHT LOWER LOBECTOMY AT [facility] --  NO CHEMORADIATION---  NO RECURRENCE    ONCOLOGIST- DR Tressie Stalker  LOV IN [facility] 10-27-2012  . Hx of adenomatous colonic polyps 2005    X 2; 1 hyperplastic polyp; Dr Olevia Perches  . Hyperlipidemia   . Hypertension   . Impaired fasting glucose 2007   108; A1c5.4%  . Lesion of bladder   . Microhematuria   . Nocturia   . OSA (obstructive sleep apnea) 08/29/2015   Mild with AHI overall 6/hr but severe during REM sleep with AHI 30/hr with oxygen saturations < 88% for 5.5 minutes of total sleep time.    [name] PAD (peripheral artery disease) ([place]) ABI'S  JAN 2014  0.65 ON RIGHT ;  1.04 ON LEFT  . Peripheral vascular disease ([place]) S/P ANGIOPLASTY AND STENTING   FOLLOWED  BY DR [name]  . Spinal stenosis Sept. 2015  . Stroke [facility]) Jul 07, 2013    mini  TIA  . Urgency of urination       Past Surgical History:  Procedure Laterality Date  . ANGIO PLASTY     X 4 in legs  . AORTOGRAM  07-27-2002   MILD DIFFUSE ILIAC ARTERY OCCLUSIVE DISEASE /  LEFT RENAL ARTERY 20%/ PATENT LEFT FEM-POP GRAFT/ MILD SFA AND POPLITEAL ARTERY OCCLUSIVE DISEASE W/ SEVERE KIDNEY OCCLUSIVE DISEASE  . BASAL CELL CARCINOMA EXCISION     MULTIPLE TIMES--  RIGHT FOREARM, CHEEKS, AND BACK  . CARDIOVASCULAR STRESS TEST  12-08-2012  DR MCLEAN   NORMAL LEXISCAN WITH NO EXERCISE NUCLEAR STUDY/ EF 66%/   NO ISCHEMIA/ NO SIGNIFICANT CHANGE FROM PRIOR STUDY  . CAROTID ANGIOGRAM N/A 07/10/2013   Procedure: CAROTID ANGIOGRAM;  Surgeon: Elam Dutch, MD;  Location: [facility] CATH LAB;  Service: Cardiovascular;  Laterality: N/A;  . CAROTID ENDARTERECTOMY Right  07-14-13   cea  . CATARACT EXTRACTION W/ INTRAOCULAR LENS  IMPLANT, BILATERAL    . colon polyectomy    . CYSTOSCOPY W/ RETROGRADES Bilateral 01/21/2013   Procedure: CYSTOSCOPY WITH RETROGRADE PYELOGRAM;  Surgeon: Molli Hazard, MD;  Location: [facility];  Service: Urology;  Laterality: Bilateral;   CYSTO, BLADDER BIOPSY, BILATERAL RETROGRADE PYELOGRAM  RAD TECH FROM RADIOLOGY PER JOY  . CYSTOSCOPY WITH BIOPSY N/A 01/21/2013   Procedure: CYSTOSCOPY WITH BIOPSY;  Surgeon: Molli Hazard, MD;  Location[facility];  Service: Urology;  Laterality: N/A;  . ENDARTERECTOMY Right 07/14/2013   Procedure: ENDARTERECTOMY CAROTID;  Surgeon: Angelia Mould, MD;  Location: [place];  Service: Vascular;  Laterality: Right;  . EP IMPLANTABLE DEVICE N/A 08/12/2015   Procedure: Loop Recorder Insertion;  Surgeon: Thompson Grayer, MD;  Location: [place] CV LAB;  Service: Cardiovascular;  Laterality: N/A;  . FEMORAL-POPLITEAL BYPASS GRAFT Left 1994  [facility]   AND 2001  IN [place]  . FEMORAL-POPLITEAL BYPASS GRAFT Left 08/30/2015   Procedure: REVISION OF BYPASS GRAFT Left   FEMORAL-POPLITEAL ARTERY;  Surgeon: Angelia Mould, MD;  Location: [place];  Service: Vascular;  Laterality: Left;  . LARYNGOSCOPY  06-27-2004   BX VOCAL CORD  (LEUKOPLAKIA)  PER PT NO ISSUES SINCE  . LEFT HEART CATH AND CORONARY ANGIOGRAPHY N/A 08/20/2017   Procedure: LEFT HEART CATH AND CORONARY ANGIOGRAPHY;  Surgeon: Larey Dresser, MD;  Location: [place] CV LAB;  Service: Cardiovascular;  Laterality: N/A;  . LOWER EXTREMITY ANGIOGRAM Bilateral 08/29/2015   Procedure: Lower Extremity Angiogram;  Surgeon: Angelia Mould, MD;  Location: [place] CV LAB;  Service: Cardiovascular;  Laterality: Bilateral;  . LUNG LOBECTOMY  01/24/2011    RIGHT UPPER LOBE  (SQUAMOUS CELL CARCINOMA) Dr Dorthea Cove , [facility]. No chemotherapyor radiation  . PATCH ANGIOPLASTY Right 07/14/2013   Procedure: PATCH ANGIOPLASTY;  Surgeon: Angelia Mould, MD;  Location: [place];  Service: Vascular;  Laterality: Right;  . PATCH ANGIOPLASTY Left 08/30/2015   Procedure: VEIN PATCH ANGIOPLASTY OF PROXIMAL Left BYPASS GRAFT;  Surgeon: Angelia Mould, MD;  Location: [place];  Service: Vascular;  Laterality: Left;  . PERIPHERAL VASCULAR CATHETERIZATION N/A 08/29/2015   Procedure: Abdominal Aortogram;  Surgeon: Angelia Mould, MD;  Location: [place] CV LAB;  Service: Cardiovascular;  Laterality: N/A;  . trabecular surgery     OS  . TRANSTHORACIC ECHOCARDIOGRAM  12-29-2012  DR [facility]   MILD LVH/  LVSF NORMAL/ EF 51-02%/  GRADE I DIASTOLIC DYSFUNCTION    in for   Chief Complaint  Patient presents with  . Fall    On blood thinners      HPI  Troy [name]  is a 77 y.o. male, with past medic history significant for COPD/lung cancer/atrial fibrillation, followed by Dr. Halford Chessman FROM PULMONARY ,presenting With multiple falls and weakness for the last to days. One week ago he presented to pulmonary for chest congestion and he was started on steroids and doxycycline his congestion improved however he  started getting weak and had 3 falls until today when he fell and sustained a laceration on his left forehead. CT of the head was negative earache was having increase in tremors in his upper extremities noted by family. No chest pains, shortness of breath, nausea or vomiting lately. His labs were almost at baseline with a slightly increased creatinine to 1.54 but he was noted to  Be orthostatic in the emergency room .  The patient is admitted for observation .    Review of Systems    In addition to the HPI above,  No Fever-chills, No Headache, No changes with Vision or hearing, No problems swallowing food or Liquids, No Chest pain, Cough or Shortness of Breath, No Abdominal pain, No Nausea or Vommitting, Bowel movements are regular, No Blood in stool or Urine, No dysuria, No new skin rashes or bruises, No new joints pains-aches,  No recent weight gain or loss, No polyuria, polydypsia or polyphagia,   A full 10 point Review of Systems was done, except as stated above, all other Review of Systems were negative.   Social History Social History   Tobacco Use  . Smoking status: Former Smoker    Packs/day: 0.50    Years: 50.00    Pack years: 25.00    Types: Cigarettes    Last attempt to quit: 07/29/2011    Years since quitting: 6.5  . Smokeless tobacco: Never Used  Substance Use Topics  . Alcohol use: Yes    Alcohol/week: 0.0 oz    Comment:  socially, variable     Family History Family History  Problem Relation Age of Onset  . Stroke Mother        mini strokes  . Alcohol abuse Father   . Heart disease Father        MI after 64  . Stroke Father   . Hypertension Father   . Heart attack Father   . Heart disease Paternal Aunt        several  . Hypertension Paternal Aunt        several  . Stroke Paternal Aunt        several  . Stroke Paternal Uncle        several  . Heart disease Paternal Uncle        several;2 had MI pre 71     Prior to Admission medications    Medication Sig Start Date End Date Taking? Authorizing Provider  acetaminophen (TYLENOL) 325 MG tablet Take 650 mg every 6 (six) hours as needed by mouth for moderate pain or headache.   Yes [provider]  amiodarone (PACERONE) 200 MG tablet TAKE 1/2 TABLET EVERY DAY. Patient taking differently: TAKE 1/2 TABLET (100 mg) by mouth EVERY DAY. 01/21/18  Yes Larey Dresser, MD  bimatoprost (LUMIGAN) 0.01 % SOLN Place 1 drop at bedtime into both eyes.    Yes [provider]  Biotin 10000 MCG TABS Take 10,000 mcg daily by mouth.    Yes [provider]  budesonide-formoterol (SYMBICORT) 160-4.5 MCG/ACT inhaler Inhale 2 puffs into the lungs 2 (two) times daily. 01/20/18  Yes Chesley Mires, MD  CARDURA 4 MG tablet TAKE (1/2) TABLET DAILY. Patient taking differently: TAKE 1/2 TABLET ('2mg'$ ) by mouth DAILY. 10/16/17  Yes Burns, Claudina Lick, MD  diltiazem (CARDIZEM CD) 120 MG 24 hr capsule TAKE 1 CAPSULE DAILY. Patient taking differently: Take '120mg'$  by mouth every day. 01/08/18  Yes Larey Dresser, MD  dorzolamide-timolol (COSOPT) 22.3-6.8 MG/ML ophthalmic solution Place 2 drops 2 (two) times daily into the right eye.    Yes [provider]  ELIQUIS 5 MG TABS tablet TAKE 1 TABLET BY MOUTH TWICE DAILY. Patient taking differently: TAKE 1 TABLET ('5mg'$ )BY MOUTH TWICE DAILY. 01/08/18  Yes Larey Dresser, MD  FLUoxetine (PROZAC) 20 MG capsule TAKE (1) CAPSULE DAILY. Patient taking differently: Take '20mg'$  by mouth every day 12/09/17  Yes Burns, Claudina Lick, MD  fluticasone (FLONASE) 50 MCG/ACT nasal spray Place 2 sprays into both nostrils daily. 01/17/18  Yes [provider]  furosemide (LASIX) 40 MG tablet TAKE 1/2 TABLET ('20MG'$ ) DAILY ALTERNATING WITH 1 TABLET ('40MG'$ ). 10/02/17  Yes Larey Dresser, MD  Hypertonic Nasal Wash (SINUS RINSE KIT NA) Place 1 application into the nose daily as needed (congestion).   Yes [provider]  iron polysaccharides (POLY-IRON 150) 150 MG  capsule Take 150 mg by mouth daily.   Yes [provider]  Melatonin 5 MG TABS Take 5 mg by mouth at bedtime.   Yes [provider]  montelukast (SINGULAIR) 10 MG tablet Take 1 tablet (10 mg total) by mouth at bedtime. 01/20/18  Yes Chesley Mires, MD  Multiple Vitamin (MULTIVITAMIN WITH MINERALS) TABS tablet Take 1 tablet by mouth daily. Centrum Silver   Yes [provider]  Polyethyl Glycol-Propyl Glycol (SYSTANE OP) Place 1-2 drops as needed into both eyes (for dry eyes).   Yes [provider]  potassium chloride SA (K-DUR,KLOR-CON) 20 MEQ tablet TAKE 1 TABLET ONCE DAILY. Patient taking differently: Take 20 MEQ by mouth every day 01/08/18  Yes Larey Dresser, MD  ranitidine (ZANTAC) 150 MG tablet Take 150 mg by mouth daily as needed for heartburn.    Yes [provider]  rosuvastatin (CRESTOR) 40 MG tablet TAKE 1 TABLET ONCE DAILY. Patient taking differently: Take '40mg'$  by mouth every day 01/08/18  Yes Larey Dresser, MD  doxycycline (VIBRA-TABS) 100 MG tablet Take 1 tablet (100 mg total) by mouth 2 (two) times daily. Patient not taking: Reported on 01/29/2018 01/22/18   Tanda Rockers, MD  predniSONE (DELTASONE) 10 MG tablet 3 pills daily for 2 days, 2 pills daily for 2 days, 1 pill daily for 2 days Patient not taking: Reported on 01/29/2018 01/20/18   Chesley Mires, MD    Allergies  Allergen Reactions  . Penicillins Hives, Shortness Of Breath and Other (See Comments)    Flushing  & dyspnea Because of a history of documented adverse serious drug reaction;Medi Alert bracelet  is recommended PCN reaction causing immediate rash, facial/tongue/throat swelling, SOB or lightheadedness with hypotension: Yes PCN reaction causing severe rash involving mucus membranes or skin necrosis: No PCN reaction occurring within the last 10 years: NO PCN reaction that required hospitalization: NO  . Sulfonamide Derivatives Hives, Shortness Of Breath and Other (See  Comments)    Flushing & dyspnea Because of a history of documented adverse serious drug reaction;Medi Alert bracelet  is recommended  . Niacin-Lovastatin Er Other (See Comments)    Dyspnea, flushing  . Atorvastatin Other (See Comments)    Myalgias & athralgias- takes Crestor    Physical Exam  Vitals  Blood pressure (!) 145/63, pulse (!) 107, temperature 99 F (37.2 C), temperature source Oral, resp. rate 18, SpO2 97 %.   1. General extremely pleasant gentleman, in no acute distress  2. Normal affect and insight, Not Suicidal or Homicidal, Awake Alert, Oriented X 3.  3. No F.N deficits, grossly, patient moving all extremities.  4. Ears and Eyes appear Normal, Conjunctivae clear, PERRLA. Moist Oral Mucosa.  5. Supple Neck, No JVD, No cervical lymphadenopathy appriciated, No Carotid Bruits.  6. Symmetrical Chest wall movement, Good air movement bilaterally, CTAB.  7. Irregularly irregular No Gallops, Rubs or Murmurs, No Parasternal Heave.  8. Positive Bowel Sounds, Abdomen Soft, Non tender, No organomegaly appriciated,No rebound -guarding or rigidity.  9.  No Cyanosis, Normal Skin Turgor, No Skin Rash  or Bruise.  10. Good muscle tone,  joints appear normal , no effusions, Normal ROM.    Data Review  CBC Recent Labs  Lab 01/29/18 1640  WBC 10.0  HGB 13.5  HCT 41.9  PLT 250  MCV 89.1  MCH 28.7  MCHC 32.2  RDW 14.8  LYMPHSABS 1.4  MONOABS 0.6  EOSABS 0.0  BASOSABS 0.0   ------------------------------------------------------------------------------------------------------------------  Chemistries  Recent Labs  Lab 01/29/18 1640  NA 139  K 4.0  CL 103  CO2 27  GLUCOSE 116*  BUN 29*  CREATININE 1.54*  CALCIUM 8.7*  AST 30  ALT 37  ALKPHOS 60  BILITOT 0.6   ------------------------------------------------------------------------------------------------------------------ estimated creatinine clearance is 46.1 mL/min (A) (by C-G formula based on SCr  of 1.54 mg/dL (H)). ------------------------------------------------------------------------------------------------------------------ No results for input(s): TSH, T4TOTAL, T3FREE, THYROIDAB in the last 72 hours.  Invalid input(s): FREET3   Coagulation profile No results for input(s): INR, PROTIME in the last 168 hours. ------------------------------------------------------------------------------------------------------------------- No results for input(s): DDIMER in the last 72 hours. -------------------------------------------------------------------------------------------------------------------  Cardiac Enzymes No results for input(s): CKMB, TROPONINI, MYOGLOBIN in the last 168 hours.  Invalid input(s): CK ------------------------------------------------------------------------------------------------------------------ Invalid input(s): POCBNP   ---------------------------------------------------------------------------------------------------------------  Urinalysis    Component Value Date/Time   COLORURINE YELLOW 01/29/2018 1816   APPEARANCEUR CLEAR 01/29/2018 1816   LABSPEC 1.019 01/29/2018 1816   PHURINE 5.0 01/29/2018 1816   GLUCOSEU NEGATIVE 01/29/2018 1816   HGBUR NEGATIVE 01/29/2018 1816   BILIRUBINUR NEGATIVE 01/29/2018 1816   KETONESUR NEGATIVE 01/29/2018 1816   PROTEINUR NEGATIVE 01/29/2018 1816   UROBILINOGEN 1.0 07/10/2013 1500   NITRITE NEGATIVE 01/29/2018 1816   LEUKOCYTESUR NEGATIVE 01/29/2018 1816    ----------------------------------------------------------------------------------------------------------------   Imaging results:   Dg Chest 2 View  Result Date: 01/29/2018 CLINICAL DATA:  Frequent falls, dyspnea/cough. EXAM: CHEST - 2 VIEW COMPARISON:  01/20/2018 CXR and chest CT 08/13/2017 FINDINGS: The heart size and mediastinal contours are within normal limits. Volume loss from prior right lower lobectomy with chronic stable fibrothorax of the  right lung base, unchanged in appearance. Adjacent bandlike opacity compatible with scarring is seen at the right lung base. Left lung remains clear. No acute osseous abnormality. IMPRESSION: No significant change in the appearance of the chest. Chronic blunting of the right posterolateral costophrenic angle with postop change and scarring at the right lung base. No active pulmonary disease. Electronically Signed   By: Ashley Royalty M.D.   On: 01/29/2018 18:15   Dg Chest 2 View  Result Date: 01/21/2018 CLINICAL DATA:  Cough, chest congestion, shortness of breath for the past 4 5 days. History of COPD. Former smoker. History of right lower lobectomy and postsurgical fibrothorax. EXAM: CHEST - 2 VIEW COMPARISON:  CT scan of the chest of August 13, 2017 and chest x-ray of June 22, 2015 FINDINGS: There is chronic volume loss on the right. The pleural thickening at the right lung base is stable. The interstitial markings are coarse in the aerated portion of the lower right lung and are more conspicuous than in the past. The left lung is well-expanded. The interstitial markings are coarse. The heart is normal in size. The bony thorax exhibits no acute abnormality. IMPRESSION: Chronic postsurgical volume loss at the right lung base. Possible superimposed subsegmental atelectasis or interstitial pneumonia at the right base. No left-sided pneumonia. Underlying COPD. No CHF. Followup PA and lateral chest X-ray is recommended in 3-4 weeks following trial of antibiotic therapy to ensure resolution and exclude underlying malignancy. Electronically Signed  By: David  Martinique M.D.   On: 01/21/2018 08:40   Ct Head Wo Contrast  Result Date: 01/29/2018 CLINICAL DATA:  77 y/o  M; fall with abrasion to the forehead. EXAM: CT HEAD WITHOUT CONTRAST TECHNIQUE: Contiguous axial images were obtained from the base of the skull through the vertex without intravenous contrast. COMPARISON:  06/22/2015 CT head.  07/29/2015 MRI brain.  FINDINGS: Brain: No evidence of acute infarction, hemorrhage, hydrocephalus, extra-axial collection or mass lesion/mass effect. Stable chronic microvascular ischemic changes and parenchymal volume loss of the brain given differences in technique. Vascular: Calcific atherosclerosis of carotid siphons and vertebral arteries. No hyperdense vessel. Skull: Normal. Negative for fracture or focal lesion. Sinuses/Orbits: No acute finding. Other: Bilateral glaucoma devices. IMPRESSION: 1. No acute intracranial abnormality or calvarial fracture. 2. Stable chronic microvascular ischemic changes and parenchymal volume loss of the brain. Electronically Signed   By: Kristine Garbe M.D.   On: 01/29/2018 18:37    My personal review of EKG: Rhythm a. Fib at 73 bpm with nonspecific intraventricular conduction delay    Assessment & Plan  1. Syncopal episode     Place in telemetry      Serial troponins  2. Generalized weakness? Steroid myopathy and mild dehydration     Lasix dose decreased to 20 mg by mouth daily 3.left forehead laceration    Wound care 4. Atrial fibrillation, rate controlled 5.COPD/lung cancer     Followed by Dr. Halford Chessman   DVT Prophylaxis adequacies  AM Labs Ordered, also please review Full Orders  Family Communication: Admission, patients condition and plan of care including tests being ordered have been discussed with the patient and wife who indicate understanding and agree with the plan and Code Status.  Code Status full  Disposition Plan: home  Time spent in minutes : 42 minutes  Condition GUARDED   '@SIGNATURE'$ @

## 2018-01-29 NOTE — ED Notes (Signed)
[facility].

## 2018-01-29 NOTE — ED Triage Notes (Addendum)
Per [facility]: From home, had a fall about 50 minutes ago. Was in the restroom when he fell, no LOC. Feels "really weak", has had a URI, has had increased generalized weakness for 3-4 days, on blood thinner. Did hit his head, small abrasion on head. Pt fell yesterday as well due to generalized weakness. A&O X 4, stroke scale negative. HX afib.   Pt denies fevers, denies vomiting or diarrhea. Pt states that over the last 2 weeks he became more short of breath than he normally is. Pt states that he is in pulmonary rehab.

## 2018-01-30 ENCOUNTER — Encounter (HOSPITAL_COMMUNITY): Payer: Commercial Managed Care - PPO

## 2018-01-30 ENCOUNTER — Telehealth: Payer: Self-pay | Admitting: *Deleted

## 2018-01-30 ENCOUNTER — Encounter (HOSPITAL_COMMUNITY): Payer: Self-pay | Admitting: General Practice

## 2018-01-30 ENCOUNTER — Other Ambulatory Visit: Payer: Self-pay

## 2018-01-30 ENCOUNTER — Telehealth: Payer: Self-pay | Admitting: Internal Medicine

## 2018-01-30 DIAGNOSIS — R55 Syncope and collapse: Secondary | ICD-10-CM | POA: Diagnosis not present

## 2018-01-30 DIAGNOSIS — S0990XA Unspecified injury of head, initial encounter: Secondary | ICD-10-CM | POA: Diagnosis not present

## 2018-01-30 DIAGNOSIS — I951 Orthostatic hypotension: Secondary | ICD-10-CM | POA: Diagnosis not present

## 2018-01-30 LAB — PHOSPHORUS: Phosphorus: 3.7 mg/dL (ref 2.5–4.6)

## 2018-01-30 LAB — BASIC METABOLIC PANEL
Anion gap: 9 (ref 5–15)
BUN: 25 mg/dL — AB (ref 6–20)
CO2: 25 mmol/L (ref 22–32)
CREATININE: 1.31 mg/dL — AB (ref 0.61–1.24)
Calcium: 8.4 mg/dL — ABNORMAL LOW (ref 8.9–10.3)
Chloride: 105 mmol/L (ref 101–111)
GFR calc Af Amer: 59 mL/min — ABNORMAL LOW (ref >60)
GFR, EST NON AFRICAN AMERICAN: 51 mL/min — AB (ref >60)
Glucose, Bld: 119 mg/dL — ABNORMAL HIGH (ref 65–99)
Potassium: 4.1 mmol/L (ref 3.5–5.1)
Sodium: 139 mmol/L (ref 135–145)

## 2018-01-30 LAB — TROPONIN I

## 2018-01-30 LAB — MAGNESIUM: MAGNESIUM: 2.6 mg/dL — AB (ref 1.7–2.4)

## 2018-01-30 MED ORDER — SODIUM CHLORIDE 0.9 % IV SOLN
INTRAVENOUS | Status: AC
  Administered 2018-01-30: 20:00:00 via INTRAVENOUS

## 2018-01-30 MED ORDER — SODIUM CHLORIDE 0.9 % IV BOLUS
500.0000 mL | Freq: Once | INTRAVENOUS | Status: AC
Start: 2018-01-30 — End: 2018-01-30
  Administered 2018-01-30: 500 mL via INTRAVENOUS

## 2018-01-30 MED ORDER — POLYETHYLENE GLYCOL 3350 17 G PO PACK
17.0000 g | PACK | Freq: Every day | ORAL | Status: DC | PRN
  Administered 2018-01-30: 17 g via ORAL
  Filled 2018-01-30: qty 1

## 2018-01-30 NOTE — Progress Notes (Signed)
[facility] PROGRESS NOTE  Troy [name] [name] TIR:[id number] DOB: 09/06/1941 DOA: 01/29/2018 PCP: Binnie Rail, MD  Assessment/Plan:  1. Syncopal episode     Place in telemetry      Serial troponins neg ECHO ordered Pos orthostatics with PT, hydrate overnight 2. Generalized weakness? Steroid myopathy and mild dehydration     Lasix held 3.left forehead laceration    Wound care 4. Paroxsymal Atrial fibrillation, rate controlled - check Mg and Phoss 5.COPD/lung cancer     Followed by Dr. Halford Chessman 6. Fall Cont PT/OT   DVT Prophylaxis adequacies  AM Labs Ordered, also please review Full Orders  Family Communication: Admission, patients condition and plan of care including tests being ordered have been discussed with the patient and wife who indicate understanding and agree with the plan and Code Status.  Code Status full  Disposition Plan: home     HPI/Subjective: No pain. Denis SOB. No N/V.  Objective: Vitals:   01/30/18 0943 01/30/18 1029  BP: 128/78 122/66  Pulse:  (!) 57  Resp:    Temp:  (!) 97.4 F (36.3 C)  SpO2:  100%    Intake/Output Summary (Last 24 hours) at 01/30/2018 1953 Last data filed at 01/30/2018 1516 Gross per 24 hour  Intake 620 ml  Output 300 ml  Net 320 ml   Filed Weights   01/30/18 1029  Weight: 90.5 kg (199 lb 8 oz)    Exam:   General:  NAD, a&Ox3  Cardiovascular: IRR, no tachy  Respiratory: CTAB, nl wob  Abdomen: ND, Bs+  Musculoskeletal: moving all extr  Data Reviewed: Basic Metabolic Panel: Recent Labs  Lab 01/29/18 1640 01/30/18 0451  NA 139 139  K 4.0 4.1  CL 103 105  CO2 27 25  GLUCOSE 116* 119*  BUN 29* 25*  CREATININE 1.54* 1.31*  CALCIUM 8.7* 8.4*   Liver Function Tests: Recent Labs  Lab 01/29/18 1640  AST 30  ALT 37  ALKPHOS 60  BILITOT 0.6  PROT 6.2*  ALBUMIN 3.2*   No results for input(s): LIPASE, AMYLASE in the last 168 hours. No results for input(s): AMMONIA in the last 168  hours. CBC: Recent Labs  Lab 01/29/18 1640  WBC 10.0  NEUTROABS 8.0*  HGB 13.5  HCT 41.9  MCV 89.1  PLT 250   Cardiac Enzymes: Recent Labs  Lab 01/30/18 0451 01/30/18 1038  TROPONINI <0.03 <0.03   BNP (last 3 results) Recent Labs    01/29/18 1631  BNP 341.7*    ProBNP (last 3 results) No results for input(s): PROBNP in the last 8760 hours.  CBG: No results for input(s): GLUCAP in the last 168 hours.  No results found for this or any previous visit (from the past 240 hour(s)).   Studies: Dg Chest 2 View  Result Date: 01/29/2018 CLINICAL DATA:  Frequent falls, dyspnea/cough. EXAM: CHEST - 2 VIEW COMPARISON:  01/20/2018 CXR and chest CT 08/13/2017 FINDINGS: The heart size and mediastinal contours are within normal limits. 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 Adjacent bandlike opacity compatible with scarring is seen at the right lung base. Left lung remains clear. No acute osseous abnormality. IMPRESSION: No significant change in the appearance of the chest. Chronic blunting of the right posterolateral costophrenic angle with postop change and scarring at the right lung base. No active pulmonary disease. Electronically Signed   By: Ashley Royalty M.D.   On: 01/29/2018 18:15  Ct Head Wo Contrast  Result Date: 01/29/2018 CLINICAL DATA:  77 y/o  M; fall with abrasion to the forehead. EXAM: CT HEAD WITHOUT CONTRAST TECHNIQUE: Contiguous axial images were obtained from the base of the skull through the vertex without intravenous contrast. COMPARISON:  06/22/2015 CT head.  07/29/2015 MRI brain. FINDINGS: Brain: No evidence of acute infarction, hemorrhage, hydrocephalus, extra-axial collection or mass lesion/mass effect. Stable chronic microvascular ischemic changes and parenchymal volume loss of the brain given differences in technique. Vascular: Calcific atherosclerosis of carotid siphons and  vertebral arteries. No hyperdense vessel. Skull: Normal. Negative for fracture or focal lesion. Sinuses/Orbits: No acute finding. Other: Bilateral glaucoma devices. IMPRESSION: 1. No acute intracranial abnormality or calvarial fracture. 2. Stable chronic microvascular ischemic changes and parenchymal volume loss of the brain. Electronically Signed   By: Kristine Garbe M.D.   On: 01/29/2018 18:37    Scheduled Meds: . amiodarone  100 mg Oral Daily  . apixaban  5 mg Oral BID  . diltiazem  120 mg Oral Daily  . dorzolamide-timolol  2 drop Right Eye BID  . doxazosin  2 mg Oral Daily  . famotidine  20 mg Oral Daily  . FLUoxetine  20 mg Oral Daily  . fluticasone  2 spray Each Nare Daily  . furosemide  20 mg Oral Daily  . iron polysaccharides  150 mg Oral Daily  . latanoprost  1 drop Both Eyes QHS  . Melatonin  3 mg Oral QHS  . mometasone-formoterol  2 puff Inhalation BID  . montelukast  10 mg Oral QHS  . multivitamin with minerals  1 tablet Oral Daily  . potassium chloride SA  20 mEq Oral Daily  . rosuvastatin  40 mg Oral Daily  . sodium chloride flush  3 mL Intravenous Q12H   Continuous Infusions: . sodium chloride    . sodium chloride      Active Problems:   Syncope    Time spent: [facility] Pager AMION. If 7PM-7AM, please contact night-coverage at [email or web address], password [facility] 01/30/2018, 7:53 PM  LOS: 0 days

## 2018-01-30 NOTE — Telephone Encounter (Signed)
Called patient back and informed her that after searching through his chart it looks like he is managed by [facility]. I called and had them to link him to me in [place]. Download was obtained and faxed to the floor attention Charlann Lange, RN. Jess with respiratory also notified this information will be available in the patient's chart for them to refer to.

## 2018-01-30 NOTE — Plan of Care (Signed)
Oriented to room and unit. Continuous cardiac monitor. A fib controlled on monitored. Instructed to call prn needs/oob etc. Bed alarm in use. Family at bedside.

## 2018-01-30 NOTE — Evaluation (Signed)
Physical Therapy Evaluation Patient Details Name: Fermon Ureta [name] MRN: [id number] DOB: 01-03-1941 Today's Date: 01/30/2018   History of Present Illness  Pt is a 77 y/o male admited secondary to falls from syncopal episodes. CT negative for acute abnormality. PMH includes AAA, TIA, a fib, COPD, lung cancer, glaucoma, HTN, PAD.   Clinical Impression  Pt admitted secondary to problem above with deficits below. Mobility limited to standing at EOB as pt with orthostatic BP; see below. Pt unsteady upon standing and reports feeling woozy; requiring min A for standing. Anticipate pt will progress well once BP improves, however, will continue to follow and update recommendations accordingly.   Orthostatics: Lying: 146/68 mmHg; HR: 82bpm Sitting: 106/68 mmHg; HR: 81bpm Standing: 86/58 mmHg; HR: 91 bpm Standing 3 min: 88/61; HR: 81 bpm    Follow Up Recommendations Home health PT;Supervision/Assistance - 24 hour    Equipment Recommendations  Other (comment)(rollator )    Recommendations for Other Services OT consult     Precautions / Restrictions Precautions Precautions: Other (comment);Fall Precaution Comments: Pt orthostatic so watch BP. 3 falls at home secondary to lightheadedness.  Restrictions Weight Bearing Restrictions: No      Mobility  Bed Mobility Overal bed mobility: Modified Independent             General bed mobility comments: Increased time required.   Transfers Overall transfer level: Needs assistance Equipment used: None Transfers: Sit to/from Stand Sit to Stand: Min assist         General transfer comment: Min A for lift assist and steadying and min to min guard to maintain standing balance secondary to decreased steadiness. Pt orthostatic so further mobility deferred.   Ambulation/Gait             General Gait Details: Deferred secondary to orthostatics.   Stairs            Wheelchair Mobility    Modified Rankin (Stroke Patients Only)        Balance Overall balance assessment: Needs assistance Sitting-balance support: No upper extremity supported;Feet supported Sitting balance-Leahy Scale: Good     Standing balance support: No upper extremity supported;During functional activity Standing balance-Leahy Scale: Poor Standing balance comment: Reliant on external support.                              Pertinent Vitals/Pain Pain Assessment: No/denies pain    Home Living Family/patient expects to be discharged to:: Private residence Living Arrangements: Spouse/significant other Available Help at Discharge: Family;Available 24 hours/day Type of Home: House Home Access: Stairs to enter Entrance Stairs-Rails: None Entrance Stairs-Number of Steps: 2(porch and threshold) Home Layout: Two level;Able to live on main level with bedroom/bathroom Home Equipment: Shower seat - built in;Cane - single point      Prior Function Level of Independence: Independent         Comments: Was working with pulmonary rehab.      Hand Dominance        Extremity/Trunk Assessment   Upper Extremity Assessment Upper Extremity Assessment: Defer to OT evaluation    Lower Extremity Assessment Lower Extremity Assessment: Generalized weakness    Cervical / Trunk Assessment Cervical / Trunk Assessment: Other exceptions Cervical / Trunk Exceptions: Abraision on forehead from fall.   Communication   Communication: No difficulties  Cognition Arousal/Alertness: Awake/alert Behavior During Therapy: WFL for tasks assessed/performed Overall Cognitive Status: Within Functional Limits for tasks assessed  General Comments General comments (skin integrity, edema, etc.): Pt very concerned about not being able to go home. Educated that it was up to MD and importance of making sure he was medically ready to d/c.     Exercises     Assessment/Plan    PT Assessment Patient  needs continued PT services  PT Problem List Cardiopulmonary status limiting activity;Decreased balance;Decreased mobility;Decreased strength;Decreased knowledge of use of DME;Decreased knowledge of precautions       PT Treatment Interventions DME instruction;Gait training;Stair training;Functional mobility training;Therapeutic activities;Therapeutic exercise;Balance training;Patient/family education    PT Goals (Current goals can be found in the Care Plan section)  Acute Rehab PT Goals Patient Stated Goal: to go home  PT Goal Formulation: With patient Time For Goal Achievement: 02/13/18 Potential to Achieve Goals: Good    Frequency Min 3X/week   Barriers to discharge        Co-evaluation               AM-PAC PT "6 Clicks" Daily Activity  Outcome Measure Difficulty turning over in bed (including adjusting bedclothes, sheets and blankets)?: None Difficulty moving from lying on back to sitting on the side of the bed? : A Little Difficulty sitting down on and standing up from a chair with arms (e.g., wheelchair, bedside commode, etc,.)?: Unable Help needed moving to and from a bed to chair (including a wheelchair)?: A Little Help needed walking in hospital room?: A Lot Help needed climbing 3-5 steps with a railing? : A Lot 6 Click Score: 15    End of Session Equipment Utilized During Treatment: Gait belt Activity Tolerance: Treatment limited secondary to medical complications (Comment)(orthostatics ) Patient left: in bed;with call bell/phone within reach Nurse Communication: Mobility status;Other (comment)(orthostatics ) PT Visit Diagnosis: Other abnormalities of gait and mobility (R26.89);Unsteadiness on feet (R26.81);History of falling (Z91.81)    Time: 0940-7680 PT Time Calculation (min) (ACUTE ONLY): 26 min   Charges:   PT Evaluation $PT Eval Moderate Complexity: 1 Mod PT Treatments $Therapeutic Activity: 8-22 mins   PT G Codes:        Leighton Ruff,  PT, DPT  Acute Rehabilitation Services  Pager: [phone]   Rudean Hitt 01/30/2018, 10:38 AM

## 2018-01-30 NOTE — ED Notes (Signed)
Patient reports headache. Tylenol administered-will continue to re-assess

## 2018-01-30 NOTE — Telephone Encounter (Signed)
Called and spoke with pt's spouse Troy [name] clarifying that she was needing to know what settings pt's cpap needed to be placed on.  Per Troy [name], pt is currently in the hospital and they are going to get him set up on cpap tonight.  Pt stated to me she has called [name] who is supposed to be calling her back to let them know pt's cpap settings.  Pt currently has an appt scheduled with TP 5/7 for a follow up, but going to also place that the visit is a HFU.  Stated to [name] to call our office if they needed anything. Troy [name] expressed understanding. Nothing further needed at this time.

## 2018-01-30 NOTE — Telephone Encounter (Signed)
Received a call from patient's wife. She states the patient is currently in the hospital and "they" need to know what pressure his CPAP @ home is set on. Asked the patient if she knew which company set up him up on his therapy, as he is a patient of Dr Landis Gandy. She did not know. I told her to let me review his chart and call her back.

## 2018-01-31 ENCOUNTER — Observation Stay (HOSPITAL_BASED_OUTPATIENT_CLINIC_OR_DEPARTMENT_OTHER): Payer: Commercial Managed Care - PPO

## 2018-01-31 DIAGNOSIS — I5032 Chronic diastolic (congestive) heart failure: Secondary | ICD-10-CM | POA: Diagnosis not present

## 2018-01-31 DIAGNOSIS — I951 Orthostatic hypotension: Secondary | ICD-10-CM | POA: Diagnosis not present

## 2018-01-31 DIAGNOSIS — R55 Syncope and collapse: Secondary | ICD-10-CM

## 2018-01-31 DIAGNOSIS — I480 Paroxysmal atrial fibrillation: Secondary | ICD-10-CM | POA: Diagnosis not present

## 2018-01-31 DIAGNOSIS — I361 Nonrheumatic tricuspid (valve) insufficiency: Secondary | ICD-10-CM | POA: Diagnosis not present

## 2018-01-31 LAB — ECHOCARDIOGRAM COMPLETE
HEIGHTINCHES: 73 in
Weight: 3188.73 oz

## 2018-01-31 MED ORDER — PERFLUTREN LIPID MICROSPHERE
1.0000 mL | INTRAVENOUS | Status: AC | PRN
  Administered 2018-01-31: 4 mL via INTRAVENOUS
  Filled 2018-01-31: qty 10

## 2018-01-31 NOTE — Consult Note (Signed)
Primary Physician: Binnie Rail, MD PCP-Cardiologist:  [name]  Reason for Consultation: Presyncope  HPI:    Troy Kenner [name] is seen today for evaluation of presyncope/orthostasis at the request of Dr. Jonelle Sidle.   77 y.o. with history of HTN, COPD, active smoking/COPD, carotid stenosis s/p right CEA, paroxysmal atrial fibrillation, and PAD.  I know him well from the outpatient clinic.  He had a left fem-pop bypass at the [facility] in the '90s.  He is followed at [facility] for PAD. He has chronic right calf/thigh/buttocks claudication that is unchanged over the last few years and follows regularly at [facility].  He has had right lower lobectomy for lung cancer.  He continues to stay off cigarettes.    At a prior appointment, he was in atrial fibrillation but did not realize it.  I started him on Eliquis and diltiazem CD, and he spontaneously converted to NSR.  In 5/15, he was at [facility] and felt "strange" one day: fatigued, weak, short of breath. He went to the ER and was in atrial fibrillation with HR in 80s-90s.  He spontaneously converted to NSR in the ER.  He felt back to normal after converting to NSR.  I started him on Multaq 400 mg bid.  With CHF, he was eventually transitioned over to amiodarone. He has been in NSR predominantly.   He had patch angioplasty revision of left fem-pop bypass in 11/16.  Now with minimal claudication.  He follows with [facility].     He had a Cardiolite and echo in 7/17 that were unremarkable.  Repeat Cardiolite in 12/17 showed no ischemia, echo was uninterpretable.  Cardiac MRI was therefore done in 1/18, showing EF 66% with normal-appearing RV, no late gadolinium enhancement.   Given increased exertional dyspnea and a defect on Cardiolite, he had LHC in 11/18.  This showed no obstructive CAD. He had a chest CT done given concern for possible amiodarone lung toxicity with increased dyspnea. This showed emphysema, no ILD.  He was seen by Dr. Melvyn Novas and not thought  to have amiodarone lung toxicity.   He has been doing well recently, was going to pulmonary rehab.  However, for about a week prior to admission, he had a severe URI with cough, congestion, sore throat.  He did not feel like eating.  He was started on steroids and doxycycline.  He developed weakness and orthostatic dizziness with several falls. This brought him to the ER.  He did not pass out but had presyncope. He was markedly orthostatic in the ER.  Lasix was stopped, he was gently hydrated.  URI symptoms have resolved.  He was noted to be back in atrial fibrillation at admission, HR has remained in the 50s.  He is still orthostatic today though he is not symptomatic.  Says he feels much better than at admission.   Echo this admission with stable EF 55-60%.   Review of Systems: All systems reviewed and negative except as per HPI.   Home Medications Prior to Admission medications   Medication Sig Start Date End Date Taking? Authorizing Provider  acetaminophen (TYLENOL) 325 MG tablet Take 650 mg every 6 (six) hours as needed by mouth for moderate pain or headache.   Yes [provider]  amiodarone (PACERONE) 200 MG tablet TAKE 1/2 TABLET EVERY DAY. Patient taking differently: TAKE 1/2 TABLET (100 mg) by mouth EVERY DAY. 01/21/18  Yes Larey Dresser, MD  bimatoprost (LUMIGAN) 0.01 % SOLN Place  1 drop at bedtime into both eyes.    Yes [provider]  Biotin 10000 MCG TABS Take 10,000 mcg daily by mouth.    Yes [provider]  budesonide-formoterol (SYMBICORT) 160-4.5 MCG/ACT inhaler Inhale 2 puffs into the lungs 2 (two) times daily. 01/20/18  Yes Chesley Mires, MD  CARDURA 4 MG tablet TAKE (1/2) TABLET DAILY. Patient taking differently: TAKE 1/2 TABLET (40m) by mouth DAILY. 10/16/17  Yes Burns, SClaudina Lick MD  diltiazem (CARDIZEM CD) 120 MG 24 hr capsule TAKE 1 CAPSULE DAILY. Patient taking differently: Take 122mby mouth every day. 01/08/18  Yes McLarey DresserMD    dorzolamide-timolol (COSOPT) 22.3-6.8 MG/ML ophthalmic solution Place 2 drops 2 (two) times daily into the right eye.    Yes [provider]  ELIQUIS 5 MG TABS tablet TAKE 1 TABLET BY MOUTH TWICE DAILY. Patient taking differently: TAKE 1 TABLET (72m772mY MOUTH TWICE DAILY. 01/08/18  Yes McLLarey DresserD  FLUoxetine (PROZAC) 20 MG capsule TAKE (1) CAPSULE DAILY. Patient taking differently: Take 44m69m mouth every day 12/09/17  Yes Burns, StacClaudina Lick  fluticasone (FLONASE) 50 MCG/ACT nasal spray Place 2 sprays into both nostrils daily. 01/17/18  Yes [provider]  furosemide (LASIX) 40 MG tablet TAKE 1/2 TABLET (20MG) DAILY ALTERNATING WITH 1 TABLET (40MG). 10/02/17  Yes McLeLarey Dresser  Hypertonic Nasal Wash (SINUS RINSE KIT NA) Place 1 application into the nose daily as needed (congestion).   Yes [provider]  iron polysaccharides (POLY-IRON 150) 150 MG capsule Take 150 mg by mouth daily.   Yes [provider]  Melatonin 5 MG TABS Take 5 mg by mouth at bedtime.   Yes [provider]  montelukast (SINGULAIR) 10 MG tablet Take 1 tablet (10 mg total) by mouth at bedtime. 01/20/18  Yes SoodChesley Mires  Multiple Vitamin (MULTIVITAMIN WITH MINERALS) TABS tablet Take 1 tablet by mouth daily. Centrum Silver   Yes [provider]  Polyethyl Glycol-Propyl Glycol (SYSTANE OP) Place 1-2 drops as needed into both eyes (for dry eyes).   Yes [provider]  potassium chloride SA (K-DUR,KLOR-CON) 20 MEQ tablet TAKE 1 TABLET ONCE DAILY. Patient taking differently: Take 20 MEQ by mouth every day 01/08/18  Yes McLeLarey Dresser  ranitidine (ZANTAC) 150 MG tablet Take 150 mg by mouth daily as needed for heartburn.    Yes [provider]  rosuvastatin (CRESTOR) 40 MG tablet TAKE 1 TABLET ONCE DAILY. Patient taking differently: Take 40mg472mmouth every day 01/08/18  Yes McLeaLarey Dresser doxycycline (VIBRA-TABS) 100 MG tablet Take 1  tablet (100 mg total) by mouth 2 (two) times daily. Patient not taking: Reported on 01/29/2018 01/22/18   Wert,Tanda Rockers predniSONE (DELTASONE) 10 MG tablet 3 pills daily for 2 days, 2 pills daily for 2 days, 1 pill daily for 2 days Patient not taking: Reported on 01/29/2018 01/20/18   Sood,Chesley Mires  PMH: 1. HTN: Fatigue and cough with ramipril use.  2. COPD: Quit smoking 2014. PFTs (1/18) with moderately severe COPD.  - CT chest (11/18): Emphysema noted, no ILD.  3. AAA: CT 1/13 with 3.0 cm AAA.  Abdominal US (1[name]4) with 3.4 cm AAA. Abdominal US ([name]) with 3.25 x 3.27 AAA. Abdominal US (3[name]) with 3.7 cm AAA.  Followed at [facility].  - Abd US (1[name]): 3.3 cm AAA. 4. Squamous cell lung cancer diagnosed 2/12.  Had right lower lobectomy  in 4/12.  5. Hyperlipidemia 6. PAD: Left fem-pop bypass 1994.  ABIs (2/12) 0.62 on right, 0.95 on left. ABIs (1/14): 0.65 on right, 1.04 on left. ABIs (4/15) 0.66 right, 0.87 left.  ABIs (3/16) 0.6 right, 0.88 left.  Patch angioplasty left fem-pop bypass in 11/16.  - Left fem-pop bypass patent on doppler evaluation in 12/17.  7. Stress myoview 2004 was normal. Lexiscan Cardiolite (3/14) with EF 66%, no ischemia or infarction. Lexiscan Cardiolite (7/17) with EF 61%, no ischemia/infarction.  - Cardiolite (12/17) with EF 62%, inferior/inferolateral fixed defect, most likely diaphragmatic attenuation, no ischemia.  - Cardiolite (9/18) with EF > 65%, fixed inferior defect (attenuation versus infarction), no ischemia.  - LHC (11/18): No obstructive CAD.  8. Chronic diastolic CHF: Echo (9/45): technically difficult with EF 55-60%, upper normal RV size.  Echo (5/16) with EF 60-65%.  Echo (7/17) with EF 55-60%, normal RV size and systolic function.  - Echo 12/17 with very poor windows, unable to comment on LV or RV function.  - Cardiac MRI (1/18) with EF 66%, normal RV size and systolic function.  9. Carotid stenosis: TIA 10/14.  Carotid dopplers with > 03% RICA, 88-82% LICA.   Patient had right CEA in 10/14. Carotids (4/15) patent right CEA, LICA 80-03% stenosis.  Carotids (49/17) < 91% LICA, patent right CEA.  - Carotid dopplers (12/17): right CEA ok, < 50% LICA stenosis.  - Carotid dopplers (1/19): Right CEA ok, 5-69% LICA stenosis.  10. Atrial fibrillation: Paroxysmal. First noted after lobectomy in 4/12 (brief), recurrence in 4/15 then in 5/15.  11. Spinal stenosis 12. OSA: Using CPAP.  102. Glaucoma  Past Surgical History: Past Surgical History:  Procedure Laterality Date  . ANGIO PLASTY     X 4 in legs  . AORTOGRAM  07-27-2002   MILD DIFFUSE ILIAC ARTERY OCCLUSIVE DISEASE /  LEFT RENAL ARTERY 20%/ PATENT LEFT FEM-POP GRAFT/ MILD SFA AND POPLITEAL ARTERY OCCLUSIVE DISEASE W/ SEVERE KIDNEY OCCLUSIVE DISEASE  . BASAL CELL CARCINOMA EXCISION     MULTIPLE TIMES--  RIGHT FOREARM, CHEEKS, AND BACK  . CARDIOVASCULAR STRESS TEST  12-08-2012  DR    NORMAL LEXISCAN WITH NO EXERCISE NUCLEAR STUDY/ EF 66%/   NO ISCHEMIA/ NO SIGNIFICANT CHANGE FROM PRIOR STUDY  . CAROTID ANGIOGRAM N/A 07/10/2013   Procedure: CAROTID ANGIOGRAM;  Surgeon: Elam Dutch, MD;  Location: [facility] CATH LAB;  Service: Cardiovascular;  Laterality: N/A;  . CAROTID ENDARTERECTOMY Right 07-14-13   cea  . CATARACT EXTRACTION W/ INTRAOCULAR LENS  IMPLANT, BILATERAL    . colon polyectomy    . CYSTOSCOPY W/ RETROGRADES Bilateral 01/21/2013   Procedure: CYSTOSCOPY WITH RETROGRADE PYELOGRAM;  Surgeon: Molli Hazard, MD;  Location: [facility];  Service: Urology;  Laterality: Bilateral;   CYSTO, BLADDER BIOPSY, BILATERAL RETROGRADE PYELOGRAM  RAD TECH FROM RADIOLOGY PER JOY  . CYSTOSCOPY WITH BIOPSY N/A 01/21/2013   Procedure: CYSTOSCOPY WITH BIOPSY;  Surgeon: Molli Hazard, MD;  Location: [facility];  Service: Urology;  Laterality: N/A;  . ENDARTERECTOMY Right 07/14/2013   Procedure: ENDARTERECTOMY CAROTID;  Surgeon: Angelia Mould, MD;   Location: [place];  Service: Vascular;  Laterality: Right;  . EP IMPLANTABLE DEVICE N/A 08/12/2015   Procedure: Loop Recorder Insertion;  Surgeon: Thompson Grayer, MD;  Location: [place] CV LAB;  Service: Cardiovascular;  Laterality: N/A;  . FEMORAL-POPLITEAL BYPASS GRAFT Left 1994  [facility]   AND 2001  IN [place]  . FEMORAL-POPLITEAL BYPASS GRAFT Left  08/30/2015   Procedure: REVISION OF BYPASS GRAFT Left  FEMORAL-POPLITEAL ARTERY;  Surgeon: Angelia Mould, MD;  Location: [place];  Service: Vascular;  Laterality: Left;  . LARYNGOSCOPY  06-27-2004   BX VOCAL CORD  (LEUKOPLAKIA)  PER PT NO ISSUES SINCE  . LEFT HEART CATH AND CORONARY ANGIOGRAPHY N/A 08/20/2017   Procedure: LEFT HEART CATH AND CORONARY ANGIOGRAPHY;  Surgeon: Larey Dresser, MD;  Location: [place] CV LAB;  Service: Cardiovascular;  Laterality: N/A;  . LOWER EXTREMITY ANGIOGRAM Bilateral 08/29/2015   Procedure: Lower Extremity Angiogram;  Surgeon: Angelia Mould, MD;  Location: [place] CV LAB;  Service: Cardiovascular;  Laterality: Bilateral;  . LUNG LOBECTOMY  01/24/2011    RIGHT UPPER LOBE  (SQUAMOUS CELL CARCINOMA) Dr Dorthea Cove , [facility]. No chemotherapyor radiation  . PATCH ANGIOPLASTY Right 07/14/2013   Procedure: PATCH ANGIOPLASTY;  Surgeon: Angelia Mould, MD;  Location: [place];  Service: Vascular;  Laterality: Right;  . PATCH ANGIOPLASTY Left 08/30/2015   Procedure: VEIN PATCH ANGIOPLASTY OF PROXIMAL Left BYPASS GRAFT;  Surgeon: Angelia Mould, MD;  Location: [place];  Service: Vascular;  Laterality: Left;  . PERIPHERAL VASCULAR CATHETERIZATION N/A 08/29/2015   Procedure: Abdominal Aortogram;  Surgeon: Angelia Mould, MD;  Location: [place] CV LAB;  Service: Cardiovascular;  Laterality: N/A;  . trabecular surgery     OS  . TRANSTHORACIC ECHOCARDIOGRAM  12-29-2012  DR [facility]   MILD LVH/  LVSF NORMAL/ EF 27-25%/  GRADE I DIASTOLIC DYSFUNCTION    Family History: Family History    Problem Relation Age of Onset  . Stroke Mother        mini strokes  . Alcohol abuse Father   . Heart disease Father        MI after 8  . Stroke Father   . Hypertension Father   . Heart attack Father   . Heart disease Paternal Aunt        several  . Hypertension Paternal Aunt        several  . Stroke Paternal Aunt        several  . Stroke Paternal Uncle        several  . Heart disease Paternal Uncle        several;2 had MI pre 10    Social History: Social History   Socioeconomic History  . Marital status: Married    Spouse name: [name]   . Number of children: 3  . Years of education: 12+  . Highest education level: Not on file  Occupational History  . Not on file  Social Needs  . Financial resource strain: Not on file  . Food insecurity:    Worry: Not on file    Inability: Not on file  . Transportation needs:    Medical: Not on file    Non-medical: Not on file  Tobacco Use  . Smoking status: Former Smoker    Packs/day: 0.50    Years: 50.00    Pack years: 25.00    Types: Cigarettes    Last attempt to quit: 07/29/2011    Years since quitting: 6.5  . Smokeless tobacco: Never Used  Substance and Sexual Activity  . Alcohol use: Yes    Alcohol/week: 0.0 oz    Comment:  socially, variable  . Drug use: No  . Sexual activity: Not on file  Lifestyle  . Physical activity:    Days per week: Not on file    Minutes per session:  Not on file  . Stress: Not on file  Relationships  . Social connections:    Talks on phone: Not on file    Gets together: Not on file    Attends religious service: Not on file    Active member of club or organization: Not on file    Attends meetings of clubs or organizations: Not on file    Relationship status: Not on file  Other Topics Concern  . Not on file  Social History Narrative   Patient lives at home with spouse [name]   Patient has 3 children    Patient is right handed     Allergies:  Allergies  Allergen Reactions  .  Penicillins Hives, Shortness Of Breath and Other (See Comments)    Flushing  & dyspnea Because of a history of documented adverse serious drug reaction;Medi Alert bracelet  is recommended PCN reaction causing immediate rash, facial/tongue/throat swelling, SOB or lightheadedness with hypotension: Yes PCN reaction causing severe rash involving mucus membranes or skin necrosis: No PCN reaction occurring within the last 10 years: NO PCN reaction that required hospitalization: NO  . Sulfonamide Derivatives Hives, Shortness Of Breath and Other (See Comments)    Flushing & dyspnea Because of a history of documented adverse serious drug reaction;Medi Alert bracelet  is recommended  . Niacin-Lovastatin Er Other (See Comments)    Dyspnea, flushing  . Atorvastatin Other (See Comments)    Myalgias & athralgias- takes Crestor    Objective:    Vital Signs:   Temp:  [97.6 F (36.4 C)-97.7 F (36.5 C)] 97.6 F (36.4 C) (05/03 0500) Pulse Rate:  [50-63] 58 (05/03 0905) Resp:  [18-20] 18 (05/03 0500) BP: (103-140)/(55-80) 123/60 (05/03 1140) SpO2:  [97 %-100 %] 100 % (05/03 0905) Weight:  [199 lb 4.7 oz (90.4 kg)] 199 lb 4.7 oz (90.4 kg) (05/03 0700) Last BM Date: 01/31/18  Weight change: Filed Weights   01/30/18 1029 01/31/18 0700  Weight: 199 lb 8 oz (90.5 kg) 199 lb 4.7 oz (90.4 kg)    Intake/Output:   Intake/Output Summary (Last 24 hours) at 01/31/2018 1322 Last data filed at 01/31/2018 1205 Gross per 24 hour  Intake 1890 ml  Output 850 ml  Net 1040 ml      Physical Exam    General:  Well appearing. No resp difficulty HEENT: normal Neck: supple. JVP not elevated. Carotids 2+ bilat; no bruits. No lymphadenopathy or thyromegaly appreciated. Cor: PMI nondisplaced. Irregular rate & rhythm. No rubs, gallops or murmurs. Lungs: clear Abdomen: soft, nontender, nondistended. No hepatosplenomegaly. No bruits or masses. Good bowel sounds. Extremities: no cyanosis, clubbing, rash,  edema Neuro: alert & orientedx3, cranial nerves grossly intact. moves all 4 extremities w/o difficulty. Affect pleasant   Telemetry   Afib in 2s, personally reviewed  EKG    Atrial fibrillation in 50s (personally reviewed)  Labs   Basic Metabolic Panel: Recent Labs  Lab 01/29/18 1640 01/30/18 0451 01/30/18 2014  NA 139 139  --   K 4.0 4.1  --   CL 103 105  --   CO2 27 25  --   GLUCOSE 116* 119*  --   BUN 29* 25*  --   CREATININE 1.54* 1.31*  --   CALCIUM 8.7* 8.4*  --   MG  --   --  2.6*  PHOS  --   --  3.7    Liver Function Tests: Recent Labs  Lab 01/29/18 1640  AST 30  ALT 37  ALKPHOS 60  BILITOT 0.6  PROT 6.2*  ALBUMIN 3.2*   No results for input(s): LIPASE, AMYLASE in the last 168 hours. No results for input(s): AMMONIA in the last 168 hours.  CBC: Recent Labs  Lab 01/29/18 1640  WBC 10.0  NEUTROABS 8.0*  HGB 13.5  HCT 41.9  MCV 89.1  PLT 250    Cardiac Enzymes: Recent Labs  Lab 01/30/18 0451 01/30/18 1038  TROPONINI <0.03 <0.03    BNP: BNP (last 3 results) Recent Labs    01/29/18 1631  BNP 341.7*    ProBNP (last 3 results) No results for input(s): PROBNP in the last 8760 hours.   CBG: No results for input(s): GLUCAP in the last 168 hours.  Coagulation Studies: No results for input(s): LABPROT, INR in the last 72 hours.   Imaging    No results found.   Medications:     Current Medications: . amiodarone  100 mg Oral Daily  . apixaban  5 mg Oral BID  . dorzolamide-timolol  2 drop Right Eye BID  . doxazosin  2 mg Oral Daily  . famotidine  20 mg Oral Daily  . FLUoxetine  20 mg Oral Daily  . fluticasone  2 spray Each Nare Daily  . iron polysaccharides  150 mg Oral Daily  . latanoprost  1 drop Both Eyes QHS  . Melatonin  3 mg Oral QHS  . mometasone-formoterol  2 puff Inhalation BID  . montelukast  10 mg Oral QHS  . multivitamin with minerals  1 tablet Oral Daily  . potassium chloride SA  20 mEq Oral Daily  .  rosuvastatin  40 mg Oral Daily  . sodium chloride flush  3 mL Intravenous Q12H     Infusions: . sodium chloride        Assessment/Plan   1. Presyncope/orthostatic hypotension: This is most likely due to dehydration with ongoing use of Lasix and poor po intake in the setting of significant URI.  Symptomatically, he feels better.  However, he was still quite orthostatic when checked today.  Echo done today was stable, EF 55-60%.  No significant arrhythmias noted other than his baseline atrial fibrillation (rate 50s, stable).  - Will have him wear ted hose.  - Stop diltiazem with low BP.  - Hold doxazosin x 4 days then restart.  - Hold Lasix for now, can restart 20 mg daily in 4 days (was on 40 alternating with 20).  - Encourage po hydration.  2. Atrial fibrillation: History of paroxysmal atrial fibrillation, generally in NSR on amiodarone.  However, in setting of current stressful event, he is back in afib.  - Continue apixaban 5 mg bid.  - Continue amiodarone 100 daily.  Seen by Dr. Melvyn Novas in past, unlikely to have amiodarone lung toxicity.  - Will stop diltiazem with low HR.  - He has missed apixaban doses.  I have asked him to make sure to miss no doses from now forwards.  I will have him followup in afib clinic next week, then plan for DCCV in about 3 wks after he has taken apixaban without missing doses. 3. Chronic diastolic CHF: Probably dehydrated at admission, not volume overloaded.  As above, holding Lasix.   4. Hyperlipidemia: Continue Crestor.   5. Carotid stenosis: s/p R CEA.  Dopplers followed at [facility], stable when done recently.  6. PAD: s/p patch angioplasty to left fem-pop bypass in 11/16.  Followed at [facility]. He is off cilostazol with CHF but no increase in mild  stable claudication.   7. AAA: Stable on recent US, followed at [facility].    8. COPD: No longer smoking. PFTs in 1/18 showed moderately severe obstructive defect. CT chest in 11/18 showed emphysema.  He is not on inhalers due  to glaucoma.   - He follows with pulmonary. 9. CKD: Creatinine stable.   10. CAD: LHC in 11/18 with nonobstructive disease only.    Based on ongoing orthostasis, would probably observe him one more night, stopping diltiazem and doxazosin as above.   I will schedule followup in afib clinic.  Med changes at discharge: Hold Lasix x 4 more days then decrease to 20 mg daily.  Stop diltiazem for now.  Hold doxazosin for 4 days.   Length of Stay: 0  [name], MD  01/31/2018, 1:22 PM  Advanced Heart Failure Team Pager [phone] (M-F; 7a - 4p)  Please contact [facility] for night-coverage after hours (4p -7a ) and weekends on [email or web address]

## 2018-01-31 NOTE — Progress Notes (Signed)
Received call from patient's wife.  She had complaints about the previous day (admitted to a room late in the evening, didn't see the doctor until 8pm, etc).  Patient's wife is concerned that he is still in atrial fibrillation.  She stated she has called and left messages at Dr. Claris Gladden office, so he would see him.  I promised Mrs. Mullett that I would page the Attending and request a Cardiology Consult.

## 2018-01-31 NOTE — Progress Notes (Signed)
  Echocardiogram 2D Echocardiogram has been performed.  Bobbye Charleston 01/31/2018, 10:48 AM

## 2018-01-31 NOTE — Plan of Care (Signed)
  Problem: Clinical Measurements: Goal: Will remain free from infection Outcome: Progressing Note:  No s/s of infection noted.

## 2018-01-31 NOTE — Progress Notes (Signed)
Patient ID: Troy Kenner [name], male   DOB: 03/31/1941, 77 y.o.   MRN: [id number]  Asked to see patient by family.    I will leave full consult note later on.   Suspect presyncope was due to dehydration/orthostasis.   HR in 50s, sometimes in 40s overnight (atrial fibrillation).  He has h/o paroxysmal atrial fibrillation.  Can stop diltiazem.  Continue amiodarone, apixaban.   Hold Lasix with poor po intake post prolonged viral-type illness.   Needs orthostatics again this morning and mobilization.  Will check on him around noon to see if he has been able to get up and walk around.  May be able to go home if so.   [name] 01/31/2018 9:08 AM

## 2018-01-31 NOTE — Progress Notes (Signed)
Patient ID: Troy Kenner [name], male   DOB: 1941/05/22, 77 y.o.   MRN: [id number]  PROGRESS NOTE    Troy Bulson [name]  KXF:[id number] DOB: [birth year] DOA: 01/29/2018 PCP: Binnie Rail, MD   Outpatient Specialists: Dr [name]   Brief Narrative:  Troy [name]  is a 77 y.o. male, with past medic history significant for COPD/lung cancer/atrial fibrillation, followed by Dr. Halford Chessman FROM PULMONARY ,presenting With multiple falls and weakness for the last to days. One week ago he presented to pulmonary for chest congestion and he was started on steroids and doxycycline his congestion improved however he started getting weak and had 3 falls until today when he fell and sustained a laceration on his left forehead. CT of the head was negative earache was having increase in tremors in his upper extremities noted by family. No chest pains, shortness of breath, nausea or vomiting lately. His labs were almost at baseline with a slightly increased creatinine to 1.54 but he was noted to  Be orthostatic in the emergency room . Patient admitted with recurrent syncopal episodes secondary to orthostasis.   Assessment & Plan:   Active Problems:   Syncope   #1 syncope: Patient is still orthostatic. Workup has been completed including echocardiogram. Patient seen by Dr. Benjamine Mola. Still orthostatic. Holding diltiazem as well as digoxin. Lasix is on hold also. Patient wearing TED hose from today. Continue physical therapy and occupational therapy  #2 atrial fibrillation: Persistent but usually paroxysmal. Currently on Apixaban, amiodarone. Diltiazem currently on hold  #3 chronic diastolic CHF: Appears compensated. Lasix currently on hold.  #4 COPD: Stable. Continue current treatment  #5 hyperlipidemia: Continue statin  #6 history of AAA: Stable.  #7 coronary artery disease: Patient had left heart Cath in November of last year with nonobstructive disease. Continue follow up by cardiology  #8 hypertension: Patient  has orthostatic hypotension in the moment. Holding medications.   DVT prophylaxis: Apixaban Code Status: Full Family Communication: Wife, Daughter and son at bedside Disposition Plan: Home with Home health  Consultants:   Dr Aundra Dubin, Cardiology   Procedures:  -Echo  Antimicrobials:  -None  Subjective: Patient remains orthostatic. He is also weak and tired. No chest pain, no shortness of breath.  Objective: Vitals:   01/31/18 1049 01/31/18 1052 01/31/18 1054 01/31/18 1140  BP: 133/63 (!) 114/55 (!) 103/56 123/60  Pulse:      Resp:      Temp:      TempSrc:      SpO2:      Weight:      Height:        Intake/Output Summary (Last 24 hours) at 01/31/2018 1242 Last data filed at 01/31/2018 1205 Gross per 24 hour  Intake 1890 ml  Output 850 ml  Net 1040 ml   Filed Weights   01/30/18 1029 01/31/18 0700  Weight: 90.5 kg (199 lb 8 oz) 90.4 kg (199 lb 4.7 oz)    Examination:  General exam: Appears calm and comfortable  Respiratory system: Clear to auscultation. Respiratory effort normal. Cardiovascular system: S1 & S2 heard, RRR. No JVD, murmurs, rubs, gallops or clicks. No pedal edema. Gastrointestinal system: Abdomen is nondistended, soft and nontender. No organomegaly or masses felt. Normal bowel sounds heard. Central nervous system: Alert and oriented. No focal neurological deficits. Extremities: Symmetric 5 x 5 power. Skin: No rashes, lesions or ulcers Psychiatry: Judgement and insight appear normal. Mood & affect appropriate.     Data Reviewed: I have personally reviewed following labs  and imaging studies  CBC: Recent Labs  Lab 01/29/18 1640  WBC 10.0  NEUTROABS 8.0*  HGB 13.5  HCT 41.9  MCV 89.1  PLT 315   Basic Metabolic Panel: Recent Labs  Lab 01/29/18 1640 01/30/18 0451 01/30/18 2014  NA 139 139  --   K 4.0 4.1  --   CL 103 105  --   CO2 27 25  --   GLUCOSE 116* 119*  --   BUN 29* 25*  --   CREATININE 1.54* 1.31*  --   CALCIUM 8.7* 8.4*   --   MG  --   --  2.6*  PHOS  --   --  3.7   GFR: Estimated Creatinine Clearance: 53.4 mL/min (A) (by C-G formula based on SCr of 1.31 mg/dL (H)). Liver Function Tests: Recent Labs  Lab 01/29/18 1640  AST 30  ALT 37  ALKPHOS 60  BILITOT 0.6  PROT 6.2*  ALBUMIN 3.2*   No results for input(s): LIPASE, AMYLASE in the last 168 hours. No results for input(s): AMMONIA in the last 168 hours. Coagulation Profile: No results for input(s): INR, PROTIME in the last 168 hours. Cardiac Enzymes: Recent Labs  Lab 01/30/18 0451 01/30/18 1038  TROPONINI <0.03 <0.03   BNP (last 3 results) No results for input(s): PROBNP in the last 8760 hours. HbA1C: No results for input(s): HGBA1C in the last 72 hours. CBG: No results for input(s): GLUCAP in the last 168 hours. Lipid Profile: No results for input(s): CHOL, HDL, LDLCALC, TRIG, CHOLHDL, LDLDIRECT in the last 72 hours. Thyroid Function Tests: No results for input(s): TSH, T4TOTAL, FREET4, T3FREE, THYROIDAB in the last 72 hours. Anemia Panel: No results for input(s): VITAMINB12, FOLATE, FERRITIN, TIBC, IRON, RETICCTPCT in the last 72 hours. Urine analysis:    Component Value Date/Time   COLORURINE YELLOW 01/29/2018 1816   APPEARANCEUR CLEAR 01/29/2018 1816   LABSPEC 1.019 01/29/2018 1816   PHURINE 5.0 01/29/2018 1816   GLUCOSEU NEGATIVE 01/29/2018 1816   HGBUR NEGATIVE 01/29/2018 1816   BILIRUBINUR NEGATIVE 01/29/2018 1816   KETONESUR NEGATIVE 01/29/2018 1816   PROTEINUR NEGATIVE 01/29/2018 1816   UROBILINOGEN 1.0 07/10/2013 1500   NITRITE NEGATIVE 01/29/2018 1816   LEUKOCYTESUR NEGATIVE 01/29/2018 1816   Sepsis Labs: @LABRCNTIP (procalcitonin:4,lacticidven:4)  )No results found for this or any previous visit (from the past 240 hour(s)).       Radiology Studies: Dg Chest 2 View  Result Date: 01/29/2018 CLINICAL DATA:  Frequent falls, dyspnea/cough. EXAM: CHEST - 2 VIEW COMPARISON:  01/20/2018 CXR and chest CT 08/13/2017  FINDINGS: The heart size and mediastinal contours are within normal limits. 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 Adjacent bandlike opacity compatible with scarring is seen at the right lung base. Left lung remains clear. No acute osseous abnormality. IMPRESSION: No significant change in the appearance of the chest. Chronic blunting of the right posterolateral costophrenic angle with postop change and scarring at the right lung base. No active pulmonary disease. Electronically Signed   By: Ashley Royalty M.D.   On: 01/29/2018 18:15   Ct Head Wo Contrast  Result Date: 01/29/2018 CLINICAL DATA:  77 y/o  M; fall with abrasion to the forehead. EXAM: CT HEAD WITHOUT CONTRAST TECHNIQUE: Contiguous axial images were obtained from the base of the skull through the vertex without intravenous contrast. COMPARISON:  06/22/2015 CT head.  07/29/2015 MRI brain. FINDINGS: Brain: No evidence of acute infarction, hemorrhage,  hydrocephalus, extra-axial collection or mass lesion/mass effect. Stable chronic microvascular ischemic changes and parenchymal volume loss of the brain given differences in technique. Vascular: Calcific atherosclerosis of carotid siphons and vertebral arteries. No hyperdense vessel. Skull: Normal. Negative for fracture or focal lesion. Sinuses/Orbits: No acute finding. Other: Bilateral glaucoma devices. IMPRESSION: 1. No acute intracranial abnormality or calvarial fracture. 2. Stable chronic microvascular ischemic changes and parenchymal volume loss of the brain. Electronically Signed   By: Kristine Garbe M.D.   On: 01/29/2018 18:37        Scheduled Meds: . amiodarone  100 mg Oral Daily  . apixaban  5 mg Oral BID  . dorzolamide-timolol  2 drop Right Eye BID  . doxazosin  2 mg Oral Daily  . famotidine  20 mg Oral Daily  . FLUoxetine  20 mg Oral Daily  . fluticasone  2 spray Each Nare Daily  . iron  polysaccharides  150 mg Oral Daily  . latanoprost  1 drop Both Eyes QHS  . Melatonin  3 mg Oral QHS  . mometasone-formoterol  2 puff Inhalation BID  . montelukast  10 mg Oral QHS  . multivitamin with minerals  1 tablet Oral Daily  . potassium chloride SA  20 mEq Oral Daily  . rosuvastatin  40 mg Oral Daily  . sodium chloride flush  3 mL Intravenous Q12H   Continuous Infusions: . sodium chloride       LOS: 0 days    Time spent: 48 minutes    GARBA,LAWAL, MD [facility] Pager [phone] If 7PM-7AM, please contact night-coverage [email or web address] Password [email or web address] 01/31/2018, 12:42 PM

## 2018-01-31 NOTE — Progress Notes (Signed)
   01/31/18 1049 01/31/18 1052 01/31/18 1054  Vitals  BP 133/63 (!) 114/55 (!) 103/56  MAP (mmHg) 83 71 66  ECG Heart Rate (!) 55 (!) 59 66  Orthostatic Lying   BP- Lying 133/63  --   --   Pulse- Lying 55  --   --   Orthostatic Sitting  BP- Sitting  --  114/55  --   Pulse- Sitting  --  59  --   Orthostatic Standing at 3 minutes  BP- Standing at 3 minutes  --   --  103/56  Pulse- Standing at 3 minutes  --   --  66

## 2018-01-31 NOTE — Care Management Note (Addendum)
Case Management Note  Patient Details  Name: Troy Heatherly [name] MRN: [id number] Date of Birth: Jul 19, 1941  Subjective/Objective: Pt presented for Syncope. PTA from home with wife. Plan will be to return home with [facility] PT Services per recommendations.                    Action/Plan: CM did speak with pt with wife in regards to disposition. Pt is agreeable to services and then he wants to transition to Pulmonary Rehab Outpatient. CM did offer choice and pt's wife states they have used [facility] in the past and wants to utilize again. DME referral sent to [facility] and pt states that is appropriate. DME Rollator to be delivered to room prior to d/c. [name] unable to provide Services to the patient 2/2 insurance. CM did get second choice of [facility]- CM placed call to make sure able to take the insurance. CM awaiting call back.   Expected Discharge Date:  01/31/18               Expected Discharge Plan:  [place]  In-House Referral:  NA  Discharge planning Services  CM Consult  Post Acute Care Choice:  Home Health Choice offered to:  Patient, Spouse  DME Arranged:  Walker rolling with seat DME Agency:  [place]:  PT [place] Agency:  [place]   Status of Service:  Completed, signed off  If discussed at [place] of Stay Meetings, dates discussed:    Additional Comments: 1522 01-31-18 Jacqlyn Krauss, RN,BSN [phone] CM did speak with [facility] and they can service the patient. SOC to begin within 24 -48 hours.  Bethena Roys, RN 01/31/2018, 2:46 PM

## 2018-01-31 NOTE — Progress Notes (Signed)
   01/31/18 1414 01/31/18 1417 01/31/18 1419  Vitals  Temp 97.6 F (36.4 C)  --   --   Temp Source Oral  --   --   BP 128/61 92/65 (!) 89/73  MAP (mmHg)  --  72 79  Pulse Rate 60  --   --   Pulse Rate Source Monitor  --   --   ECG Heart Rate  --  80 74  Resp 20  --   --   Orthostatic Sitting  BP- Sitting 128/66  --   --   Pulse- Sitting 50  --   --   Orthostatic Standing at 0 minutes  BP- Standing at 0 minutes 92/65  --   --   Pulse- Standing at 0 minutes 72  --   --   Orthostatic Standing at 3 minutes  BP- Standing at 3 minutes  --  (!) 89/73  --   Pulse- Standing at 3 minutes  --  80  --   Oxygen Therapy  SpO2 100 %  --   --   O2 Device Room Air  --   --

## 2018-01-31 NOTE — Progress Notes (Signed)
Physical Therapy Treatment Patient Details Name: Troy Caddell [name] MRN: [id number] DOB: 10-Jul-1941 Today's Date: 01/31/2018    History of Present Illness Pt is a 77 y/o male admited secondary to falls from syncopal episodes. CT negative for acute abnormality. PMH includes AAA, TIA, a fib, COPD, lung cancer, glaucoma, HTN, PAD.     PT Comments    Pt able to amb in hallway but still with orthostatic BP. Gait stability and confidence improved with rollator.   Orthostatic BPs  Sitting 128/66      HR 50  Standing 92/65         HR 72  Standing after 3 min 89/73         HR 72  Standing after ambulation 110/56       HR 75      Follow Up Recommendations  Home health PT;Supervision for mobility/OOB     Equipment Recommendations  Other (comment)(rollator )    Recommendations for Other Services       Precautions / Restrictions Precautions Precautions: Other (comment);Fall Precaution Comments: Pt orthostatic so watch BP. 3 falls at home secondary to lightheadedness.  Restrictions Weight Bearing Restrictions: No    Mobility  Bed Mobility               General bed mobility comments: Pt up in chair.  Transfers Overall transfer level: Needs assistance Equipment used: None Transfers: Sit to/from Stand Sit to Stand: Supervision         General transfer comment: supervision for safety  Ambulation/Gait Ambulation/Gait assistance: Supervision Ambulation Distance (Feet): 350 Feet Assistive device: None;4-wheeled walker Gait Pattern/deviations: Step-through pattern;Decreased stride length   Gait velocity interpretation: >2.62 ft/sec, indicative of community ambulatory General Gait Details: Supervision for safety. Pt with improved stability with use of rollator.   Stairs             Wheelchair Mobility    Modified Rankin (Stroke Patients Only)       Balance Overall balance assessment: Needs assistance Sitting-balance support: No upper extremity  supported;Feet supported Sitting balance-Leahy Scale: Good     Standing balance support: No upper extremity supported Standing balance-Leahy Scale: Good                              Cognition Arousal/Alertness: Awake/alert Behavior During Therapy: WFL for tasks assessed/performed Overall Cognitive Status: Within Functional Limits for tasks assessed                                        Exercises      General Comments        Pertinent Vitals/Pain Pain Assessment: No/denies pain    Home Living                      Prior Function            PT Goals (current goals can now be found in the care plan section) Progress towards PT goals: Progressing toward goals    Frequency    Min 3X/week      PT Plan Current plan remains appropriate    Co-evaluation              AM-PAC PT "6 Clicks" Daily Activity  Outcome Measure  Difficulty turning over in bed (including adjusting bedclothes, sheets and blankets)?: None Difficulty moving from lying  on back to sitting on the side of the bed? : A Little Difficulty sitting down on and standing up from a chair with arms (e.g., wheelchair, bedside commode, etc,.)?: A Little Help needed moving to and from a bed to chair (including a wheelchair)?: A Little Help needed walking in hospital room?: A Little Help needed climbing 3-5 steps with a railing? : A Little 6 Click Score: 19    End of Session   Activity Tolerance: Patient tolerated treatment well Patient left: with call bell/phone within reach;in chair;with family/visitor present Nurse Communication: Mobility status;Other (comment)(orthostatics ) PT Visit Diagnosis: Other abnormalities of gait and mobility (R26.89);Unsteadiness on feet (R26.81);History of falling (Z91.81)     Time: 8377-9396 PT Time Calculation (min) (ACUTE ONLY): 22 min  Charges:  $Gait Training: 8-22 mins                    G Codes:       [facility]  PT [place] 01/31/2018, 2:44 PM

## 2018-01-31 NOTE — Progress Notes (Signed)
OT Cancellation Note  Patient Details Name: Troy Berlinger [name] MRN: [id number] DOB: June 20, 1941   Cancelled Treatment:    Reason Eval/Treat Not Completed: Other (comment). Attempted to see pt x 2 today, pt currently with PT  Britt Bottom 01/31/2018, 2:25 PM

## 2018-02-01 DIAGNOSIS — R55 Syncope and collapse: Secondary | ICD-10-CM | POA: Diagnosis not present

## 2018-02-01 MED ORDER — FUROSEMIDE 20 MG PO TABS: 20.0000 mg | ORAL_TABLET | Freq: Every day | ORAL | 11 refills | Status: DC

## 2018-02-01 NOTE — Discharge Summary (Signed)
Physician Discharge Summary  Troy Prill [name] TUU:[id number] DOB: 25-Jun-1941 DOA: 01/29/2018  PCP: Binnie Rail, MD  Admit date: 01/29/2018 Discharge date: 02/01/2018  Admitted From: Home Disposition: Home Recommendations for Outpatient Follow-up:  1. Follow up with PCP in 1-2 weeks 2. Please obtain BMP/CBC in one week    Home Health yes Equipment/Devices: ROLLATOR Discharge Condition: Stable CODE STATUS: Full code Diet recommendation: Cardiac diet  Brief Narrative:  Troy [name],with past medic history significant for COPD/lung cancer/atrial fibrillation, followed by Dr. Halford Chessman FROM PULMONARY ,presenting With multiple falls and weakness for the last to days. One week ago he presented to pulmonary for chest congestion and he was started on steroids and doxycyclinehiscongestion improved however he started getting weak and had 3 falls until today when he fell and sustained a laceration on his left forehead. CT of the head was negativeearachewas having increase in tremors in his upper extremities noted by family. No chest pains, shortness of breath, nausea or vomiting lately. His labs were almost at baseline with a slightly increased creatinine to 1.54 but he was noted to Be orthostatic in the emergency room . Patient admitted with recurrent syncopal episodes secondary to orthostasis.       Discharge Diagnoses:  Active Problems:   Near syncope   Syncope   Orthostatic hypotension  #1 syncope: Secondary to orthostatics dehydration patient being on Lasix and decreased p.o. intake.  Patient ambulated today with physical therapy and with the staff without any complaints.  Patient will be discharged home on Lasix to be started at 20 mg 02/05/2018 and doxazosin to be started on 02/05/2018 to  2 mg daily.  Diltiazem DC'd.  #2 atrial fibrillation: Persistent but usually paroxysmal. Currently on Apixaban, amiodarone. Diltiazem currently on hold  #3 chronic diastolic CHF:  Appears compensated. Lasix currently on hold.  #4 COPD: Stable. Continue current treatment  #5 hyperlipidemia: Continue statin  #6 history of AAA: Stable.  #7 coronary artery disease: Patient had left heart Cath in November of last year with nonobstructive disease. Continue follow up by cardiology  #8 hypertension: Patient has orthostatic hypotension in the moment. Holding medications.      Discharge Instructions  Discharge Instructions    Diet - low sodium heart healthy   Complete by:  As directed    Diet - low sodium heart healthy   Complete by:  As directed    Diet - low sodium heart healthy   Complete by:  As directed    Increase activity slowly   Complete by:  As directed    Increase activity slowly   Complete by:  As directed    Increase activity slowly   Complete by:  As directed      Allergies as of 02/01/2018      Reactions   Penicillins Hives, Shortness Of Breath, Other (See Comments)   Flushing  & dyspnea Because of a history of documented adverse serious drug reaction;Medi Alert bracelet  is recommended PCN reaction causing immediate rash, facial/tongue/throat swelling, SOB or lightheadedness with hypotension: Yes PCN reaction causing severe rash involving mucus membranes or skin necrosis: No PCN reaction occurring within the last 10 years: NO PCN reaction that required hospitalization: NO   Sulfonamide Derivatives Hives, Shortness Of Breath, Other (See Comments)   Flushing & dyspnea Because of a history of documented adverse serious drug reaction;Medi Alert bracelet  is recommended   Niacin-lovastatin Er Other (See Comments)   Dyspnea, flushing   Atorvastatin Other (See Comments)   Myalgias &  athralgias- takes Crestor      Medication List    STOP taking these medications   diltiazem 120 MG 24 hr capsule Commonly known as:  CARDIZEM CD   doxycycline 100 MG tablet Commonly known as:  VIBRA-TABS   predniSONE 10 MG tablet Commonly known as:   DELTASONE     TAKE these medications   acetaminophen 325 MG tablet Commonly known as:  TYLENOL Take 650 mg every 6 (six) hours as needed by mouth for moderate pain or headache.   amiodarone 200 MG tablet Commonly known as:  PACERONE TAKE 1/2 TABLET EVERY DAY. What changed:    how much to take  how to take this  when to take this   bimatoprost 0.01 % Soln Commonly known as:  LUMIGAN Place 1 drop at bedtime into both eyes.   Biotin 10000 MCG Tabs Take 10,000 mcg daily by mouth.   budesonide-formoterol 160-4.5 MCG/ACT inhaler Commonly known as:  SYMBICORT Inhale 2 puffs into the lungs 2 (two) times daily.   CARDURA 4 MG tablet Generic drug:  doxazosin TAKE (1/2) TABLET DAILY. What changed:  See the new instructions.   dorzolamide-timolol 22.3-6.8 MG/ML ophthalmic solution Commonly known as:  COSOPT Place 2 drops 2 (two) times daily into the right eye.   ELIQUIS 5 MG Tabs tablet Generic drug:  apixaban TAKE 1 TABLET BY MOUTH TWICE DAILY. What changed:    how much to take  how to take this  when to take this   FLUoxetine 20 MG capsule Commonly known as:  PROZAC TAKE (1) CAPSULE DAILY. What changed:  See the new instructions.   fluticasone 50 MCG/ACT nasal spray Commonly known as:  FLONASE Place 2 sprays into both nostrils daily.   furosemide 20 MG tablet Commonly known as:  LASIX Take 1 tablet (20 mg total) by mouth daily. START TAKING IT FROM 02/05/2018 What changed:    medication strength  See the new instructions.   Melatonin 5 MG Tabs Take 5 mg by mouth at bedtime.   montelukast 10 MG tablet Commonly known as:  SINGULAIR Take 1 tablet (10 mg total) by mouth at bedtime.   multivitamin with minerals Tabs tablet Take 1 tablet by mouth daily. Centrum Silver   POLY-IRON 150 150 MG capsule Generic drug:  iron polysaccharides Take 150 mg by mouth daily.   potassium chloride SA 20 MEQ tablet Commonly known as:  K-DUR,KLOR-CON TAKE 1 TABLET ONCE  DAILY. What changed:    how much to take  how to take this  when to take this   ranitidine 150 MG tablet Commonly known as:  ZANTAC Take 150 mg by mouth daily as needed for heartburn.   rosuvastatin 40 MG tablet Commonly known as:  CRESTOR TAKE 1 TABLET ONCE DAILY. What changed:    how much to take  how to take this  when to take this   SINUS RINSE KIT NA Place 1 application into the nose daily as needed (congestion).   SYSTANE OP Place 1-2 drops as needed into both eyes (for dry eyes).            Durable Medical Equipment  (From admission, onward)        Start     Ordered   01/31/18 1616  For home use only DME 4 wheeled rolling walker with seat  Once    Question:  Patient needs a walker to treat with the following condition  Answer:  Syncope   01/31/18 1616  Follow-up Information    [facility] Follow up.   Specialty:  Home Health Services Why:  Rolling Walker with Seat to be delivered to room prior to admission to home.  Contact information: [address] [place] [address] [phone]        [facility], [facility] Follow up.   Specialty:  Home Health Services Why:  Home Health Physical Therapy- for evaluation and treatment.  Contact information: [place] [address] [phone]          Allergies  Allergen Reactions  . Penicillins Hives, Shortness Of Breath and Other (See Comments)    Flushing  & dyspnea Because of a history of documented adverse serious drug reaction;Medi Alert bracelet  is recommended PCN reaction causing immediate rash, facial/tongue/throat swelling, SOB or lightheadedness with hypotension: Yes PCN reaction causing severe rash involving mucus membranes or skin necrosis: No PCN reaction occurring within the last 10 years: NO PCN reaction that required hospitalization: NO  . Sulfonamide Derivatives Hives, Shortness Of Breath and Other (See Comments)     Flushing & dyspnea Because of a history of documented adverse serious drug reaction;Medi Alert bracelet  is recommended  . Niacin-Lovastatin Er Other (See Comments)    Dyspnea, flushing  . Atorvastatin Other (See Comments)    Myalgias & athralgias- takes Crestor    Consultations: [facility]  Procedures/Studies: Dg Chest 2 View  Result Date: 01/29/2018 CLINICAL DATA:  Frequent falls, dyspnea/cough. EXAM: CHEST - 2 VIEW COMPARISON:  01/20/2018 CXR and chest CT 08/13/2017 FINDINGS: The heart size and mediastinal contours are within normal limits. 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 Adjacent bandlike opacity compatible with scarring is seen at the right lung base. Left lung remains clear. No acute osseous abnormality. IMPRESSION: No significant change in the appearance of the chest. Chronic blunting of the right posterolateral costophrenic angle with postop change and scarring at the right lung base. No active pulmonary disease. Electronically Signed   By: Ashley Royalty M.D.   On: 01/29/2018 18:15   Dg Chest 2 View  Result Date: 01/21/2018 CLINICAL DATA:  Cough, chest congestion, shortness of breath for the past 4 5 days. History of COPD. Former smoker. History of right lower lobectomy and postsurgical fibrothorax. EXAM: CHEST - 2 VIEW COMPARISON:  CT scan of the chest of August 13, 2017 and chest x-ray of June 22, 2015 FINDINGS: There is chronic volume loss on the right. The pleural thickening at the right lung base is stable. The interstitial markings are coarse in the aerated portion of the lower right lung and are more conspicuous than in the past. The left lung is well-expanded. The interstitial markings are coarse. The heart is normal in size. The bony thorax exhibits no acute abnormality. IMPRESSION: Chronic postsurgical volume loss at the right lung base. Possible superimposed subsegmental atelectasis or  interstitial pneumonia at the right base. No left-sided pneumonia. Underlying COPD. No CHF. Followup PA and lateral chest X-ray is recommended in 3-4 weeks following trial of antibiotic therapy to ensure resolution and exclude underlying malignancy. Electronically Signed   By: David  Martinique M.D.   On: 01/21/2018 08:40   Ct Head Wo Contrast  Result Date: 01/29/2018 CLINICAL DATA:  77 y/o  M; fall with abrasion to the forehead. EXAM: CT HEAD WITHOUT CONTRAST TECHNIQUE: Contiguous axial images were obtained from the base of the skull through the vertex without intravenous  contrast. COMPARISON:  06/22/2015 CT head.  07/29/2015 MRI brain. FINDINGS: Brain: No evidence of acute infarction, hemorrhage, hydrocephalus, extra-axial collection or mass lesion/mass effect. Stable chronic microvascular ischemic changes and parenchymal volume loss of the brain given differences in technique. Vascular: Calcific atherosclerosis of carotid siphons and vertebral arteries. No hyperdense vessel. Skull: Normal. Negative for fracture or focal lesion. Sinuses/Orbits: No acute finding. Other: Bilateral glaucoma devices. IMPRESSION: 1. No acute intracranial abnormality or calvarial fracture. 2. Stable chronic microvascular ischemic changes and parenchymal volume loss of the brain. Electronically Signed   By: Kristine Garbe M.D.   On: 01/29/2018 18:37    (Echo, Carotid, EGD, Colonoscopy, ERCP)    Subjective:   Discharge Exam: Vitals:   02/01/18 0423 02/01/18 0743  BP: (!) 145/70 134/69  Pulse: (!) 55 (!) 56  Resp:  18  Temp: (!) 97.4 F (36.3 C) 98.2 F (36.8 C)  SpO2: 99% 98%   Vitals:   01/31/18 2200 02/01/18 0113 02/01/18 0423 02/01/18 0743  BP:  (!) 159/68 (!) 145/70 134/69  Pulse: 60 (!) 56 (!) 55 (!) 56  Resp:    18  Temp:  97.6 F (36.4 C) (!) 97.4 F (36.3 C) 98.2 F (36.8 C)  TempSrc:  Axillary Oral Oral  SpO2: 97% 100% 99% 98%  Weight:   93 kg (205 lb 0.4 oz)   Height:        General:  Pt is alert, awake, not in acute distress Cardiovascular: RRR, S1/S2 +, no rubs, no gallops Respiratory: CTA bilaterally, no wheezing, no rhonchi Abdominal: Soft, NT, ND, bowel sounds + Extremities: no edema, no cyanosis    The results of significant diagnostics from this hospitalization (including imaging, microbiology, ancillary and laboratory) are listed below for reference.     Microbiology: No results found for this or any previous visit (from the past 240 hour(s)).   Labs: BNP (last 3 results) Recent Labs    01/29/18 1631  BNP 376.2*   Basic Metabolic Panel: Recent Labs  Lab 01/29/18 1640 01/30/18 0451 01/30/18 2014  NA 139 139  --   K 4.0 4.1  --   CL 103 105  --   CO2 27 25  --   GLUCOSE 116* 119*  --   BUN 29* 25*  --   CREATININE 1.54* 1.31*  --   CALCIUM 8.7* 8.4*  --   MG  --   --  2.6*  PHOS  --   --  3.7   Liver Function Tests: Recent Labs  Lab 01/29/18 1640  AST 30  ALT 37  ALKPHOS 60  BILITOT 0.6  PROT 6.2*  ALBUMIN 3.2*   No results for input(s): LIPASE, AMYLASE in the last 168 hours. No results for input(s): AMMONIA in the last 168 hours. CBC: Recent Labs  Lab 01/29/18 1640  WBC 10.0  NEUTROABS 8.0*  HGB 13.5  HCT 41.9  MCV 89.1  PLT 250   Cardiac Enzymes: Recent Labs  Lab 01/30/18 0451 01/30/18 1038  TROPONINI <0.03 <0.03   BNP: Invalid input(s): POCBNP CBG: No results for input(s): GLUCAP in the last 168 hours. D-Dimer No results for input(s): DDIMER in the last 72 hours. Hgb A1c No results for input(s): HGBA1C in the last 72 hours. Lipid Profile No results for input(s): CHOL, HDL, LDLCALC, TRIG, CHOLHDL, LDLDIRECT in the last 72 hours. Thyroid function studies No results for input(s): TSH, T4TOTAL, T3FREE, THYROIDAB in the last 72 hours.  Invalid input(s): FREET3 Anemia work up No  results for input(s): VITAMINB12, FOLATE, FERRITIN, TIBC, IRON, RETICCTPCT in the last 72 hours. Urinalysis    Component Value  Date/Time   COLORURINE YELLOW 01/29/2018 1816   APPEARANCEUR CLEAR 01/29/2018 1816   LABSPEC 1.019 01/29/2018 1816   PHURINE 5.0 01/29/2018 1816   GLUCOSEU NEGATIVE 01/29/2018 1816   HGBUR NEGATIVE 01/29/2018 1816   BILIRUBINUR NEGATIVE 01/29/2018 1816   KETONESUR NEGATIVE 01/29/2018 1816   PROTEINUR NEGATIVE 01/29/2018 1816   UROBILINOGEN 1.0 07/10/2013 1500   NITRITE NEGATIVE 01/29/2018 1816   LEUKOCYTESUR NEGATIVE 01/29/2018 1816   Sepsis Labs Invalid input(s): PROCALCITONIN,  WBC,  LACTICIDVEN Microbiology No results found for this or any previous visit (from the past 240 hour(s)).   Time coordinating discharge: Over 39 minutes  SIGNED:   Georgette Shell, MD  [facility] 02/01/2018, 10:54 AM Pager   If 7PM-7AM, please contact night-coverage [email or web address] Password [email or web address]

## 2018-02-01 NOTE — Progress Notes (Signed)
Noted patient discharging home today; [place] arranged with Lajuana Matte with [facility] called for Rollater ( rolling walker with seat). Mindi Slicker RN,MHA,BSn [phone]

## 2018-02-01 NOTE — Progress Notes (Signed)
CARDIAC REHAB PHASE I   PRE:  Rate/Rhythm: 71  BP:  Sitting: 125/68   Standing: 101/51     SaO2: 98 ra  MODE:  Ambulation: 400 ft   POST:  Rate/Rhythm: 78  BP:  Sitting: 160/81     SaO2: 98 ra  9:23a-10:02am Patient ambulated with x2 assist with rollator. Patient stated that he did have mild sob but no fatigue. No rest breaks needed. Patient in chair with daughter at side.   [place], MS 02/01/2018 10:00 AM

## 2018-02-03 ENCOUNTER — Telehealth: Payer: Self-pay | Admitting: Internal Medicine

## 2018-02-03 ENCOUNTER — Telehealth: Payer: Self-pay | Admitting: *Deleted

## 2018-02-03 NOTE — Telephone Encounter (Signed)
Spoke with Lamanual to give verbal orders per MD. Durward Fortes that pts BP has been dropping when he stands up causing a fall which lead to hospital visit. Advised pt does have hospital follow-up scheduled with Dr Quay Burow.

## 2018-02-03 NOTE — Telephone Encounter (Signed)
Transition Care Management Follow-up Telephone Call   Date discharged? 02/01/18   How have you been since you were released from the hospital? Called pt spoke w/son father was there with him.n He relay msg pt states he is feeling alright   Do you understand why you were in the hospital? YES   Do you understand the discharge instructions? YES   Where were you discharged to? Home   Items Reviewed:  Medications reviewed: YES  Allergies reviewed: YES  Dietary changes reviewed: YES, cardiac diet  Referrals reviewed: No referral needed   Functional Questionnaire:   Activities of Daily Living (ADLs):   He states he are independent in the following: feeding, continence, grooming and toileting States they require assistance with the following: ambulation, bathing and hygiene and dressing   Any transportation issues/concerns?: NO   Any patient concerns? NO   Confirmed importance and date/time of follow-up visits scheduled YES, appt 02/10/18  Provider Appointment booked with Dr. Quay Burow  Confirmed with patient if condition begins to worsen call PCP or go to the ER.  Patient was given the office number and encouraged to call back with question or concerns.  : YES

## 2018-02-03 NOTE — Progress Notes (Signed)
Discharge Progress Report  Patient Details  Name: Troy Kaus [name] MRN: [id number] Date of Birth: 02/14/1941 Referring Provider:     Pulmonary Rehab Walk Test from 11/26/2017 in [place]  Referring Provider  Dr. Melvyn Novas       Number of Visits: 13  Reason for Discharge:  Early Exit:  hospitalization for URI, multiple falls, and HH services after discharge from hospital  Smoking History:  Social History   Tobacco Use  Smoking Status Former Smoker  . Packs/day: 0.50  . Years: 50.00  . Pack years: 25.00  . Types: Cigarettes  . Last attempt to quit: 07/29/2011  . Years since quitting: 6.5  Smokeless Tobacco Never Used    Diagnosis:  Chronic obstructive pulmonary disease, unspecified COPD type ([name])  ADL UCSD: Pulmonary Assessment Scores    Row Name 11/28/17 0841         ADL UCSD   ADL Phase  Entry       mMRC Score   mMRC Score  1        Initial Exercise Prescription: Initial Exercise Prescription - 11/28/17 0800      Date of Initial Exercise RX and Referring Provider   Date  11/28/17    Referring Provider  Dr. Melvyn Novas      Bike   Level  0.6    Minutes  17      NuStep   Level  2    SPM  80    Minutes  17    METs  1.5      Track   Laps  10    Minutes  17      Prescription Details   Frequency (times per week)  2    Duration  Progress to 45 minutes of aerobic exercise without signs/symptoms of physical distress      Intensity   THRR 40-80% of Max Heartrate  57-114    Ratings of Perceived Exertion  11-13    Perceived Dyspnea  0-4      Progression   Progression  Continue progressive overload as per policy without signs/symptoms or physical distress.      Resistance Training   Training Prescription  Yes    Weight  BLUE BANDS    Reps  10-15       Discharge Exercise Prescription (Final Exercise Prescription Changes): Exercise Prescription Changes - 01/14/18 1220      Response to Exercise   Blood Pressure (Admit)   134/60    Blood Pressure (Exercise)  148/56    Blood Pressure (Exit)  120/62    Heart Rate (Admit)  63 bpm    Heart Rate (Exercise)  62 bpm    Heart Rate (Exit)  57 bpm    Oxygen Saturation (Admit)  94 %    Oxygen Saturation (Exercise)  93 %    Oxygen Saturation (Exit)  97 %    Rating of Perceived Exertion (Exercise)  13    Perceived Dyspnea (Exercise)  1    Duration  Continue with 45 min of aerobic exercise without signs/symptoms of physical distress.    Intensity  THRR unchanged      Progression   Progression  Continue to progress workloads to maintain intensity without signs/symptoms of physical distress.      Resistance Training   Training Prescription  Yes    Weight  Blue bands    Reps  10-15    Time  10 Minutes  Bike   Level  4    Minutes  17      NuStep   Level  5    SPM  80    Minutes  17    METs  1.9      Track   Laps  7    Minutes  17       Functional Capacity: 6 Minute Walk    Row Name 11/28/17 0838         6 Minute Walk   Phase  Initial     Distance  1200 feet     Walk Time  6 minutes     # of Rest Breaks  0     MPH  2.27     METS  3.3     RPE  13     Perceived Dyspnea   3     Symptoms  Yes (comment)     Comments  8/10 calf pain     Resting HR  59 bpm     Resting BP  124/58     Resting Oxygen Saturation   94 %     Exercise Oxygen Saturation  during 6 min walk  92 %     Max Ex. HR  117 bpm     Max Ex. BP  146/60       Interval HR   1 Minute HR  98     2 Minute HR  98     3 Minute HR  96     4 Minute HR  117     5 Minute HR  117     6 Minute HR  70     2 Minute Post HR  59     Interval Heart Rate?  Yes       Interval Oxygen   Interval Oxygen?  Yes     Baseline Oxygen Saturation %  94 %     1 Minute Oxygen Saturation %  94 %     1 Minute Liters of Oxygen  0 L     2 Minute Oxygen Saturation %  93 %     2 Minute Liters of Oxygen  0 L     3 Minute Oxygen Saturation %  92 %     3 Minute Liters of Oxygen  0 L     4 Minute  Oxygen Saturation %  93 %     4 Minute Liters of Oxygen  0 L     5 Minute Oxygen Saturation %  92 %     5 Minute Liters of Oxygen  0 L     6 Minute Oxygen Saturation %  94 %     6 Minute Liters of Oxygen  0 L     2 Minute Post Oxygen Saturation %  97 %     2 Minute Post Liters of Oxygen  0 L        Psychological, QOL, Others - Outcomes: PHQ 2/9: Depression screen [facility] 2/9 11/18/2017 11/04/2017 01/12/2015  Decreased Interest 0 0 0  Down, Depressed, Hopeless 0 0 0  PHQ - 2 Score 0 0 0  Some recent data might be hidden    Quality of Life:   Personal Goals: Goals established at orientation with interventions provided to work toward goal. Personal Goals and Risk Factors at Admission - 11/18/17 1251      Core Components/Risk Factors/Patient Goals on Admission   Improve shortness of breath  with ADL's  Yes    Intervention  Provide education, individualized exercise plan and daily activity instruction to help decrease symptoms of SOB with activities of daily living.    Expected Outcomes  Short Term: Improve cardiorespiratory fitness to achieve a reduction of symptoms when performing ADLs;Long Term: Be able to perform more ADLs without symptoms or delay the onset of symptoms        Personal Goals Discharge: Goals and Risk Factor Review    Row Name 12/03/17 1235 01/06/18 1009 02/03/18 1026         Core Components/Risk Factors/Patient Goals Review   Personal Goals Review  Improve shortness of breath with ADL's  Improve shortness of breath with ADL's  Improve shortness of breath with ADL's     Review  Has just begun program, has exercised once in program, tolerated it well.  Too early to see progression toward goals  patient is doing well in the pulmonary rehab program. he states he can see a significant improvement in his shortness of breath at home with ADLs and with exercise. he has even started exercising outside of rehab focusing not only on aerobic exercise but stregnth training as  well.  patient was doing well in pulmonary rehab until last week when he was admitted to the hospital for multiple falls, dehydration, and URI. He was previously seen by MD for URI and prescribed prednisone with antibiotics. His health continued to decline and he became dehydrated which resulted in several falls with bruising and facial scrapes. He was discharged home yesterday from hospital with HHPT and 24 hour care for safety. He will be discharged from pulmonary rehab and readmitted at a later time once he has recovered from current illness and deconditioning.      Expected Outcomes  see admission goals  see admission goals  -        Exercise Goals and Review:   Nutrition & Weight - Outcomes: Pre Biometrics - 11/18/17 1248      Pre Biometrics   Grip Strength  28 kg        Nutrition:   Nutrition Discharge:   Education Questionnaire Score:

## 2018-02-03 NOTE — Telephone Encounter (Signed)
Copied from [place]. Topic: Quick Communication - See Telephone Encounter >> Feb 03, 2018  1:19 PM Aurelio Brash B wrote: CRM for notification. See Telephone encounter for: 02/03/18.  Lamanual  from [place]   is asking  for PT  verbals  2x2, than 1x 2   Contact number is [phone]

## 2018-02-03 NOTE — Telephone Encounter (Signed)
noted 

## 2018-02-04 ENCOUNTER — Ambulatory Visit: Payer: Commercial Managed Care - PPO | Admitting: Adult Health

## 2018-02-04 ENCOUNTER — Encounter (HOSPITAL_COMMUNITY)
Admission: RE | Admit: 2018-02-04 | Discharge: 2018-02-04 | Disposition: A | Discharge status: Home / Self Care | Payer: Commercial Managed Care - PPO | Source: Ambulatory Visit | Attending: Internal Medicine | Admitting: Internal Medicine

## 2018-02-04 DIAGNOSIS — J449 Chronic obstructive pulmonary disease, unspecified: Secondary | ICD-10-CM

## 2018-02-05 ENCOUNTER — Encounter (HOSPITAL_COMMUNITY): Payer: Self-pay | Admitting: Nurse Practitioner

## 2018-02-05 ENCOUNTER — Ambulatory Visit (HOSPITAL_COMMUNITY)
Admit: 2018-02-05 | Discharge: 2018-02-05 | Disposition: A | Discharge status: Home / Self Care | Payer: Commercial Managed Care - PPO | Source: Ambulatory Visit | Attending: Nurse Practitioner | Admitting: Nurse Practitioner

## 2018-02-05 VITALS — Ht 73.0 in | Wt 201.0 lb

## 2018-02-05 DIAGNOSIS — Z8249 Family history of ischemic heart disease and other diseases of the circulatory system: Secondary | ICD-10-CM | POA: Diagnosis not present

## 2018-02-05 DIAGNOSIS — Z882 Allergy status to sulfonamides status: Secondary | ICD-10-CM | POA: Diagnosis not present

## 2018-02-05 DIAGNOSIS — I951 Orthostatic hypotension: Secondary | ICD-10-CM | POA: Diagnosis not present

## 2018-02-05 DIAGNOSIS — Z9842 Cataract extraction status, left eye: Secondary | ICD-10-CM | POA: Insufficient documentation

## 2018-02-05 DIAGNOSIS — Z85118 Personal history of other malignant neoplasm of bronchus and lung: Secondary | ICD-10-CM | POA: Insufficient documentation

## 2018-02-05 DIAGNOSIS — Z888 Allergy status to other drugs, medicaments and biological substances status: Secondary | ICD-10-CM | POA: Diagnosis not present

## 2018-02-05 DIAGNOSIS — G4733 Obstructive sleep apnea (adult) (pediatric): Secondary | ICD-10-CM | POA: Insufficient documentation

## 2018-02-05 DIAGNOSIS — I739 Peripheral vascular disease, unspecified: Secondary | ICD-10-CM | POA: Insufficient documentation

## 2018-02-05 DIAGNOSIS — E785 Hyperlipidemia, unspecified: Secondary | ICD-10-CM | POA: Diagnosis not present

## 2018-02-05 DIAGNOSIS — Z8673 Personal history of transient ischemic attack (TIA), and cerebral infarction without residual deficits: Secondary | ICD-10-CM | POA: Insufficient documentation

## 2018-02-05 DIAGNOSIS — J449 Chronic obstructive pulmonary disease, unspecified: Secondary | ICD-10-CM | POA: Diagnosis not present

## 2018-02-05 DIAGNOSIS — Z9181 History of falling: Secondary | ICD-10-CM | POA: Diagnosis not present

## 2018-02-05 DIAGNOSIS — Z880 Allergy status to penicillin: Secondary | ICD-10-CM | POA: Diagnosis not present

## 2018-02-05 DIAGNOSIS — I480 Paroxysmal atrial fibrillation: Secondary | ICD-10-CM | POA: Insufficient documentation

## 2018-02-05 DIAGNOSIS — Z87891 Personal history of nicotine dependence: Secondary | ICD-10-CM | POA: Insufficient documentation

## 2018-02-05 DIAGNOSIS — I10 Essential (primary) hypertension: Secondary | ICD-10-CM | POA: Insufficient documentation

## 2018-02-05 DIAGNOSIS — I484 Atypical atrial flutter: Secondary | ICD-10-CM

## 2018-02-05 DIAGNOSIS — I4892 Unspecified atrial flutter: Secondary | ICD-10-CM | POA: Insufficient documentation

## 2018-02-05 DIAGNOSIS — Z7951 Long term (current) use of inhaled steroids: Secondary | ICD-10-CM | POA: Insufficient documentation

## 2018-02-05 DIAGNOSIS — Z79899 Other long term (current) drug therapy: Secondary | ICD-10-CM | POA: Diagnosis not present

## 2018-02-05 DIAGNOSIS — Z86010 Personal history of colonic polyps: Secondary | ICD-10-CM | POA: Insufficient documentation

## 2018-02-05 DIAGNOSIS — H409 Unspecified glaucoma: Secondary | ICD-10-CM | POA: Diagnosis not present

## 2018-02-05 DIAGNOSIS — Z9841 Cataract extraction status, right eye: Secondary | ICD-10-CM | POA: Insufficient documentation

## 2018-02-05 DIAGNOSIS — Z85828 Personal history of other malignant neoplasm of skin: Secondary | ICD-10-CM | POA: Insufficient documentation

## 2018-02-05 DIAGNOSIS — I714 Abdominal aortic aneurysm, without rupture: Secondary | ICD-10-CM | POA: Diagnosis not present

## 2018-02-05 DIAGNOSIS — Z823 Family history of stroke: Secondary | ICD-10-CM | POA: Diagnosis not present

## 2018-02-05 DIAGNOSIS — Z7901 Long term (current) use of anticoagulants: Secondary | ICD-10-CM | POA: Insufficient documentation

## 2018-02-05 DIAGNOSIS — Z961 Presence of intraocular lens: Secondary | ICD-10-CM | POA: Insufficient documentation

## 2018-02-05 DIAGNOSIS — K219 Gastro-esophageal reflux disease without esophagitis: Secondary | ICD-10-CM | POA: Insufficient documentation

## 2018-02-05 DIAGNOSIS — Z902 Acquired absence of lung [part of]: Secondary | ICD-10-CM | POA: Diagnosis not present

## 2018-02-05 DIAGNOSIS — I4891 Unspecified atrial fibrillation: Secondary | ICD-10-CM | POA: Diagnosis present

## 2018-02-05 DIAGNOSIS — Z95828 Presence of other vascular implants and grafts: Secondary | ICD-10-CM | POA: Insufficient documentation

## 2018-02-05 MED ORDER — FUROSEMIDE 20 MG PO TABS: 20.0000 mg | ORAL_TABLET | Freq: Every day | ORAL | 11 refills | Status: DC

## 2018-02-05 NOTE — Patient Instructions (Addendum)
Restart Lasix 20mg  once a day  Stop Diltiazem  Only resume cardura if issues with urinating - -then would need to restart at 2mg 

## 2018-02-06 ENCOUNTER — Encounter (HOSPITAL_COMMUNITY): Payer: Commercial Managed Care - PPO

## 2018-02-06 ENCOUNTER — Encounter (INDEPENDENT_AMBULATORY_CARE_PROVIDER_SITE_OTHER): Payer: Self-pay

## 2018-02-06 ENCOUNTER — Ambulatory Visit (INDEPENDENT_AMBULATORY_CARE_PROVIDER_SITE_OTHER): Payer: Commercial Managed Care - PPO | Admitting: *Deleted

## 2018-02-06 ENCOUNTER — Other Ambulatory Visit (HOSPITAL_COMMUNITY): Payer: Self-pay | Admitting: *Deleted

## 2018-02-06 DIAGNOSIS — I480 Paroxysmal atrial fibrillation: Secondary | ICD-10-CM | POA: Diagnosis not present

## 2018-02-06 NOTE — H&P (View-Only) (Signed)
Primary Care Physician: Binnie Rail, MD Referring Physician: Dr. [name] is a 77 y.o. male with a h/o COPD/lung cancer/atrial fibrillation, followed by Dr. Halford Chessman from  Pulmonary, presenting to [facility] ER  with multiple falls and weakness for the last to days. 2 weeks ago he presented to pulmonary for chest congestion and he was started on steroids and doxycyclinehiscongestion improved however he started getting weak and had 3 falls until today when he fell and sustained a laceration on his left forehead. CT of the head was negativeearachewas having increase in tremors in his upper extremities noted by family. No chest pains, shortness of breath, nausea or vomiting lately. His labs were almost at baseline with a slightly increased creatinine to 1.54 but he was noted to be orthostatic in the emergency room .Patient admitted with recurrent syncopal episodes secondary to orthostasis.  He has h/o paroxysmal afib, but was persistent in the hospital. He has slow v rsponse and dilt was held. Lasix also was held for hypotension and Cardura also held.  He is being seen in the afib clinic in f/u. He feels improved. Orthostatics done and was not showing any signs of  Orthostasis. EKG shows persistent atrial flutter with slow v response. He is here with a Charity fundraiser and daughter today.  Today, he denies symptoms of palpitations, chest pain, shortness of breath, orthopnea, PND, lower extremity edema, dizziness, presyncope, syncope, or neurologic sequela. The patient is tolerating medications without difficulties and is otherwise without complaint today.   Past Medical History:  Diagnosis Date  . AAA (abdominal aortic aneurysm) (HCC) LAST ABD. [name]  JAN 2014  3.4CM   MONITORED BY DR [name]  . Arthritis   . Atrial fibrillation ([place])   . Benign essential HTN 01/19/2016  . Bilateral carotid artery stenosis DUPLEX 12-29-2012  BY [facility]   BILATERAL ICA STENOSIS 60-79%  . BPH (benign  prostatic hypertrophy)   . Cancer ([place]) 01-25-12   LUNG  . Complication of anesthesia    had nausea after carotid artery surgery, states the medicine given to help the nausea, made it worse  . COPD (chronic obstructive pulmonary disease) ([place])   . Exertional dyspnea   . Frequency of urination   . GERD (gastroesophageal reflux disease)   . Glaucoma BOTH EYES   Dr Gershon Crane  . Glaucoma   . History of basal cell carcinoma excision   . History of lung cancer APRIL 2012  SQUAMOUS CELL---- S/P RIGHT LOWER LOBECTOMY AT [facility] --  NO CHEMORADIATION---  NO RECURRENCE    ONCOLOGIST- DR Tressie Stalker  LOV IN [facility] 10-27-2012  . Hx of adenomatous colonic polyps 2005    X 2; 1 hyperplastic polyp; Dr Olevia Perches  . Hyperlipidemia   . Hypertension   . Impaired fasting glucose 2007   108; A1c5.4%  . Lesion of bladder   . Microhematuria   . Nocturia   . OSA (obstructive sleep apnea) 08/29/2015   Mild with AHI overall 6/hr but severe during REM sleep with AHI 30/hr with oxygen saturations < 88% for 5.5 minutes of total sleep time.    [name] PAD (peripheral artery disease) ([place]) ABI'S  JAN 2014  0.65 ON RIGHT ;  1.04 ON LEFT  . Peripheral vascular disease ([place]) S/P ANGIOPLASTY AND STENTING   FOLLOWED  BY DR [name]  . Spinal stenosis Sept. 2015  . Stroke [facility]) Jul 07, 2013   mini  TIA  . Urgency of urination    Past  Surgical History:  Procedure Laterality Date  . ANGIO PLASTY     X 4 in legs  . AORTOGRAM  07-27-2002   MILD DIFFUSE ILIAC ARTERY OCCLUSIVE DISEASE /  LEFT RENAL ARTERY 20%/ PATENT LEFT FEM-POP GRAFT/ MILD SFA AND POPLITEAL ARTERY OCCLUSIVE DISEASE W/ SEVERE KIDNEY OCCLUSIVE DISEASE  . BASAL CELL CARCINOMA EXCISION     MULTIPLE TIMES--  RIGHT FOREARM, CHEEKS, AND BACK  . CARDIOVASCULAR STRESS TEST  12-08-2012  DR MCLEAN   NORMAL LEXISCAN WITH NO EXERCISE NUCLEAR STUDY/ EF 66%/   NO ISCHEMIA/ NO SIGNIFICANT CHANGE FROM PRIOR STUDY  . CAROTID ANGIOGRAM N/A 07/10/2013   Procedure: CAROTID ANGIOGRAM;   Surgeon: Elam Dutch, MD;  Location: [facility] CATH LAB;  Service: Cardiovascular;  Laterality: N/A;  . CAROTID ENDARTERECTOMY Right 07-14-13   cea  . CATARACT EXTRACTION W/ INTRAOCULAR LENS  IMPLANT, BILATERAL    . colon polyectomy    . CYSTOSCOPY W/ RETROGRADES Bilateral 01/21/2013   Procedure: CYSTOSCOPY WITH RETROGRADE PYELOGRAM;  Surgeon: Molli Hazard, MD;  Location[facility];  Service: Urology;  Laterality: Bilateral;   CYSTO, BLADDER BIOPSY, BILATERAL RETROGRADE PYELOGRAM  RAD TECH FROM RADIOLOGY PER JOY  . CYSTOSCOPY WITH BIOPSY N/A 01/21/2013   Procedure: CYSTOSCOPY WITH BIOPSY;  Surgeon: Molli Hazard, MD;  Location: [facility];  Service: Urology;  Laterality: N/A;  . ENDARTERECTOMY Right 07/14/2013   Procedure: ENDARTERECTOMY CAROTID;  Surgeon: Angelia Mould, MD;  Location: [place];  Service: Vascular;  Laterality: Right;  . EP IMPLANTABLE DEVICE N/A 08/12/2015   Procedure: Loop Recorder Insertion;  Surgeon: Thompson Grayer, MD;  Location: [place] CV LAB;  Service: Cardiovascular;  Laterality: N/A;  . FEMORAL-POPLITEAL BYPASS GRAFT Left 1994  [facility]   AND 2001  IN [place]  . FEMORAL-POPLITEAL BYPASS GRAFT Left 08/30/2015   Procedure: REVISION OF BYPASS GRAFT Left  FEMORAL-POPLITEAL ARTERY;  Surgeon: Angelia Mould, MD;  Location: [place];  Service: Vascular;  Laterality: Left;  . LARYNGOSCOPY  06-27-2004   BX VOCAL CORD  (LEUKOPLAKIA)  PER PT NO ISSUES SINCE  . LEFT HEART CATH AND CORONARY ANGIOGRAPHY N/A 08/20/2017   Procedure: LEFT HEART CATH AND CORONARY ANGIOGRAPHY;  Surgeon: Larey Dresser, MD;  Location: [place] CV LAB;  Service: Cardiovascular;  Laterality: N/A;  . LOWER EXTREMITY ANGIOGRAM Bilateral 08/29/2015   Procedure: Lower Extremity Angiogram;  Surgeon: Angelia Mould, MD;  Location: [place] CV LAB;  Service: Cardiovascular;  Laterality: Bilateral;  . LUNG LOBECTOMY  01/24/2011    RIGHT  UPPER LOBE  (SQUAMOUS CELL CARCINOMA) Dr Dorthea Cove , [facility]. No chemotherapyor radiation  . PATCH ANGIOPLASTY Right 07/14/2013   Procedure: PATCH ANGIOPLASTY;  Surgeon: Angelia Mould, MD;  Location: [place];  Service: Vascular;  Laterality: Right;  . PATCH ANGIOPLASTY Left 08/30/2015   Procedure: VEIN PATCH ANGIOPLASTY OF PROXIMAL Left BYPASS GRAFT;  Surgeon: Angelia Mould, MD;  Location: [place];  Service: Vascular;  Laterality: Left;  . PERIPHERAL VASCULAR CATHETERIZATION N/A 08/29/2015   Procedure: Abdominal Aortogram;  Surgeon: Angelia Mould, MD;  Location: [place] CV LAB;  Service: Cardiovascular;  Laterality: N/A;  . trabecular surgery     OS  . TRANSTHORACIC ECHOCARDIOGRAM  12-29-2012  DR [facility]   MILD LVH/  LVSF NORMAL/ EF 97-98%/  GRADE I DIASTOLIC DYSFUNCTION    Current Outpatient Medications  Medication Sig Dispense Refill  . acetaminophen (TYLENOL) 325 MG tablet Take 650 mg every 6 (six) hours  as needed by mouth for moderate pain or headache.    . amiodarone (PACERONE) 200 MG tablet TAKE 1/2 TABLET EVERY DAY. (Patient taking differently: TAKE 1/2 TABLET (100 mg) by mouth EVERY DAY.) 15 tablet 6  . bimatoprost (LUMIGAN) 0.01 % SOLN Place 1 drop at bedtime into both eyes.     . Biotin 10000 MCG TABS Take 10,000 mcg daily by mouth.     . budesonide-formoterol (SYMBICORT) 160-4.5 MCG/ACT inhaler Inhale 2 puffs into the lungs 2 (two) times daily. 1 Inhaler 12  . CARDURA 4 MG tablet TAKE (1/2) TABLET DAILY. (Patient taking differently: TAKE 1/2 TABLET (2mg) by mouth DAILY.) 15 tablet 5  . dorzolamide-timolol (COSOPT) 22.3-6.8 MG/ML ophthalmic solution Place 2 drops 2 (two) times daily into the right eye.     . ELIQUIS 5 MG TABS tablet TAKE 1 TABLET BY MOUTH TWICE DAILY. (Patient taking differently: TAKE 1 TABLET (5mg)BY MOUTH TWICE DAILY.) 60 tablet 2  . FLUoxetine (PROZAC) 20 MG capsule TAKE (1) CAPSULE DAILY. (Patient taking differently: Take 20mg by mouth every  day) 30 capsule 5  . fluticasone (FLONASE) 50 MCG/ACT nasal spray Place 2 sprays into both nostrils daily.  0  . Hypertonic Nasal Wash (SINUS RINSE KIT NA) Place 1 application into the nose daily as needed (congestion).    . iron polysaccharides (POLY-IRON 150) 150 MG capsule Take 150 mg by mouth daily.    . Melatonin 5 MG TABS Take 5 mg by mouth at bedtime.    . montelukast (SINGULAIR) 10 MG tablet Take 1 tablet (10 mg total) by mouth at bedtime. 30 tablet 5  . Multiple Vitamin (MULTIVITAMIN WITH MINERALS) TABS tablet Take 1 tablet by mouth daily. Centrum Silver    . Polyethyl Glycol-Propyl Glycol (SYSTANE OP) Place 1-2 drops as needed into both eyes (for dry eyes).    . potassium chloride SA (K-DUR,KLOR-CON) 20 MEQ tablet TAKE 1 TABLET ONCE DAILY. (Patient taking differently: Take 20 MEQ by mouth every day) 30 tablet 2  . ranitidine (ZANTAC) 150 MG tablet Take 150 mg by mouth daily as needed for heartburn.     . rosuvastatin (CRESTOR) 40 MG tablet TAKE 1 TABLET ONCE DAILY. (Patient taking differently: Take 40mg by mouth every day) 30 tablet 2  . furosemide (LASIX) 20 MG tablet Take 1 tablet (20 mg total) by mouth daily. 30 tablet 11   No current facility-administered medications for this encounter.     Allergies  Allergen Reactions  . Penicillins Hives, Shortness Of Breath and Other (See Comments)    Flushing  & dyspnea Because of a history of documented adverse serious drug reaction;Medi Alert bracelet  is recommended PCN reaction causing immediate rash, facial/tongue/throat swelling, SOB or lightheadedness with hypotension: Yes PCN reaction causing severe rash involving mucus membranes or skin necrosis: No PCN reaction occurring within the last 10 years: NO PCN reaction that required hospitalization: NO  . Sulfonamide Derivatives Hives, Shortness Of Breath and Other (See Comments)    Flushing & dyspnea Because of a history of documented adverse serious drug reaction;Medi Alert bracelet   is recommended  . Niacin-Lovastatin Er Other (See Comments)    Dyspnea, flushing  . Atorvastatin Other (See Comments)    Myalgias & athralgias- takes Crestor    Social History   Socioeconomic History  . Marital status: Married    Spouse name: Lacy   . Number of children: 3  . Years of education: 12+  . Highest education level: Not on file  Occupational History  .   Not on file  Social Needs  . Financial resource strain: Not on file  . Food insecurity:    Worry: Not on file    Inability: Not on file  . Transportation needs:    Medical: Not on file    Non-medical: Not on file  Tobacco Use  . Smoking status: Former Smoker    Packs/day: 0.50    Years: 50.00    Pack years: 25.00    Types: Cigarettes    Last attempt to quit: 07/29/2011    Years since quitting: 6.5  . Smokeless tobacco: Never Used  Substance and Sexual Activity  . Alcohol use: Yes    Alcohol/week: 0.0 oz    Comment:  socially, variable  . Drug use: No  . Sexual activity: Not on file  Lifestyle  . Physical activity:    Days per week: Not on file    Minutes per session: Not on file  . Stress: Not on file  Relationships  . Social connections:    Talks on phone: Not on file    Gets together: Not on file    Attends religious service: Not on file    Active member of club or organization: Not on file    Attends meetings of clubs or organizations: Not on file    Relationship status: Not on file  . Intimate partner violence:    Fear of current or ex partner: Not on file    Emotionally abused: Not on file    Physically abused: Not on file    Forced sexual activity: Not on file  Other Topics Concern  . Not on file  Social History Narrative   Patient lives at home with spouse Lacy   Patient has 3 children    Patient is right handed     Family History  Problem Relation Age of Onset  . Stroke Mother        mini strokes  . Alcohol abuse Father   . Heart disease Father        MI after 55  . Stroke  Father   . Hypertension Father   . Heart attack Father   . Heart disease Paternal Aunt        several  . Hypertension Paternal Aunt        several  . Stroke Paternal Aunt        several  . Stroke Paternal Uncle        several  . Heart disease Paternal Uncle        several;2 had MI pre 55    ROS- All systems are reviewed and negative except as per the HPI above  Physical Exam: Vitals:   02/05/18 1537  Weight: 201 lb (91.2 kg)  Height: 6' 1" (1.854 m)   Wt Readings from Last 3 Encounters:  02/05/18 201 lb (91.2 kg)  02/01/18 205 lb 0.4 oz (93 kg)  01/20/18 202 lb 6.4 oz (91.8 kg)    Labs: Lab Results  Component Value Date   NA 139 01/30/2018   K 4.1 01/30/2018   CL 105 01/30/2018   CO2 25 01/30/2018   GLUCOSE 119 (H) 01/30/2018   BUN 25 (H) 01/30/2018   CREATININE 1.31 (H) 01/30/2018   CALCIUM 8.4 (L) 01/30/2018   PHOS 3.7 01/30/2018   MG 2.6 (H) 01/30/2018   Lab Results  Component Value Date   INR 1.16 08/05/2017   Lab Results  Component Value Date   CHOL 105 10/25/2017     HDL 35 (L) 10/25/2017   LDLCALC 50 10/25/2017   TRIG 101 10/25/2017     GEN- The patient is well appearing, alert and oriented x 3 today.   Head- normocephalic, atraumatic Eyes-  Sclera clear, conjunctiva pink Ears- hearing intact Oropharynx- clear Neck- supple, no JVP Lymph- no cervical lymphadenopathy Lungs- Clear to ausculation bilaterally, normal work of breathing Heart- Regular rate and rhythm, no murmurs, rubs or gallops, PMI not laterally displaced GI- soft, NT, ND, + BS Extremities- no clubbing, cyanosis, or edema MS- no significant deformity or atrophy Skin- no rash or lesion Psych- euthymic mood, full affect Neuro- strength and sensation are intact  EKG- atrial flutter at 54 bpm    Assessment and Plan: 1. Falls 2/2 orthostatic hypotension Not orthostatic today URI cleared and pt improved, no further falls [name] also spoke to pt today and he will continue  to hold cardura, cardizem but resume 20 mg lasix daily IF he starts having trouble urinating, he may return to cardura use  2. Paroxysmal afib Pt continues in a slow atrial flutter  He will be set up for cardioversion with [name] in the near future He states no missed does of eliquis 5 mg bid, chadsvasc score of  at least 3  F/u with [name] 5/28  Geroge Baseman. Siren Porrata, [facility] [address] [place], [place] [address] [phone]

## 2018-02-06 NOTE — Progress Notes (Signed)
Primary Care Physician: Troy Rail, MD Referring Physician: Dr. Marya [name] [name] is a 77 y.o. male with a h/o COPD/lung cancer/atrial fibrillation, followed by Troy. Halford [name] from  Pulmonary, presenting to [facility] ER  with multiple falls and weakness for the last to days. 2 weeks ago he presented to pulmonary for chest congestion and he was started on steroids and doxycyclinehiscongestion improved however he started getting weak and had 3 falls until today when he fell and sustained a laceration on his left forehead. CT of the head was negativeearachewas having increase in tremors in his upper extremities noted by family. No chest pains, shortness of breath, nausea or vomiting lately. His labs were almost at baseline with a slightly increased creatinine to 1.54 but he was noted to be orthostatic in the emergency room .Patient admitted with recurrent syncopal episodes secondary to orthostasis.  He has h/o paroxysmal afib, but was persistent in the hospital. He has slow v rsponse and dilt was held. Lasix also was held for hypotension and Cardura also held.  He is being seen in the afib clinic in f/u. He feels improved. Orthostatics done and was not showing any signs of  Orthostasis. EKG shows persistent atrial flutter with slow v response. He is here with a Charity fundraiser and daughter today.  Today, he denies symptoms of palpitations, chest pain, shortness of breath, orthopnea, PND, lower extremity edema, dizziness, presyncope, syncope, or neurologic sequela. The patient is tolerating medications without difficulties and is otherwise without complaint today.   Past Medical History:  Diagnosis Date  . AAA (abdominal aortic aneurysm) (HCC) LAST ABD. [name]  JAN 2014  3.4CM   MONITORED BY Troy [name]  . Arthritis   . Atrial fibrillation ([place])   . Benign essential HTN 01/19/2016  . Bilateral carotid artery stenosis DUPLEX 12-29-2012  BY Troy [facility]   BILATERAL ICA STENOSIS 60-79%  . BPH (benign  prostatic hypertrophy)   . Cancer ([place]) 01-25-12   LUNG  . Complication of anesthesia    had nausea after carotid artery surgery, states the medicine given to help the nausea, made it worse  . COPD (chronic obstructive pulmonary disease) ([place])   . Exertional dyspnea   . Frequency of urination   . GERD (gastroesophageal reflux disease)   . Glaucoma BOTH EYES   Troy Troy [name]  . Glaucoma   . History of basal cell carcinoma excision   . History of lung cancer APRIL 2012  SQUAMOUS CELL---- S/P RIGHT LOWER LOBECTOMY AT [facility] --  NO CHEMORADIATION---  NO RECURRENCE    ONCOLOGIST- Troy Troy [name]  LOV IN [facility] 10-27-2012  . Hx of adenomatous colonic polyps 2005    X 2; 1 hyperplastic polyp; Troy [name]  . Hyperlipidemia   . Hypertension   . Impaired fasting glucose 2007   108; A1c5.4%  . Lesion of bladder   . Microhematuria   . Nocturia   . OSA (obstructive sleep apnea) 08/29/2015   Mild with AHI overall 6/hr but severe during REM sleep with AHI 30/hr with oxygen saturations < 88% for 5.5 minutes of total sleep time.    [name] PAD (peripheral artery disease) ([place]) ABI'S  JAN 2014  0.65 ON RIGHT ;  1.04 ON LEFT  . Peripheral vascular disease ([place]) S/P ANGIOPLASTY AND STENTING   FOLLOWED  BY Troy [name]  . Spinal stenosis Sept. 2015  . Stroke [facility]) Jul 07, 2013   mini  TIA  . Urgency of urination    Past  Surgical History:  Procedure Laterality Date  . ANGIO PLASTY     X 4 in legs  . AORTOGRAM  07-27-2002   MILD DIFFUSE ILIAC ARTERY OCCLUSIVE DISEASE /  LEFT RENAL ARTERY 20%/ PATENT LEFT FEM-POP GRAFT/ MILD SFA AND POPLITEAL ARTERY OCCLUSIVE DISEASE W/ SEVERE KIDNEY OCCLUSIVE DISEASE  . BASAL CELL CARCINOMA EXCISION     MULTIPLE TIMES--  RIGHT FOREARM, CHEEKS, AND BACK  . CARDIOVASCULAR STRESS TEST  12-08-2012  Troy [name]   NORMAL LEXISCAN WITH NO EXERCISE NUCLEAR STUDY/ EF 66%/   NO ISCHEMIA/ NO SIGNIFICANT CHANGE FROM PRIOR STUDY  . CAROTID ANGIOGRAM N/A 07/10/2013   Procedure: CAROTID ANGIOGRAM;   Surgeon: Troy E Fields, MD;  Location: [facility];  Service: Cardiovascular;  Laterality: N/A;  . CAROTID ENDARTERECTOMY Right 07-14-13   cea  . CATARACT EXTRACTION W/ INTRAOCULAR LENS  IMPLANT, BILATERAL    . colon polyectomy    . CYSTOSCOPY W/ RETROGRADES Bilateral 01/21/2013   Procedure: CYSTOSCOPY WITH RETROGRADE PYELOGRAM;  Surgeon: Troy Young Woodruff, MD;  Location: [facility];  Service: Urology;  Laterality: Bilateral;   CYSTO, BLADDER BIOPSY, BILATERAL RETROGRADE PYELOGRAM  RAD TECH FROM RADIOLOGY PER Troy [name]  . CYSTOSCOPY WITH BIOPSY N/A 01/21/2013   Procedure: CYSTOSCOPY WITH BIOPSY;  Surgeon: Troy Young Woodruff, MD;  Location:  SURGERY CENTER;  Service: Urology;  Laterality: N/A;  . ENDARTERECTOMY Right 07/14/2013   Procedure: ENDARTERECTOMY CAROTID;  Surgeon: Troy S Dickson, MD;  Location: [facility] OR;  Service: Vascular;  Laterality: Right;  . EP IMPLANTABLE DEVICE N/A 08/12/2015   Procedure: Loop Recorder Insertion;  Surgeon: Troy Allred, MD;  [facility]: [facility];  Service: Cardiovascular;  Laterality: N/A;  . FEMORAL-POPLITEAL BYPASS GRAFT Left 1994  [facility]   AND 2001  IN [place]  . FEMORAL-POPLITEAL BYPASS GRAFT Left 08/30/2015   Procedure: REVISION OF BYPASS GRAFT Left  FEMORAL-POPLITEAL ARTERY;  Surgeon: Troy S Dickson, MD;  Location: [facility] OR;  Service: Vascular;  Laterality: Left;  . LARYNGOSCOPY  06-27-2004   BX VOCAL CORD  (LEUKOPLAKIA)  PER PT NO ISSUES SINCE  . LEFT HEART CATH AND CORONARY ANGIOGRAPHY N/A 08/20/2017   Procedure: LEFT HEART CATH AND CORONARY ANGIOGRAPHY;  Surgeon: [name], Troy S, MD;  [facility];  Service: Cardiovascular;  Laterality: N/A;  . LOWER EXTREMITY ANGIOGRAM Bilateral 08/29/2015   Procedure: Lower Extremity Angiogram;  Surgeon: Troy S Dickson, MD;  [facility];  Service: Cardiovascular;  Laterality: Bilateral;  . LUNG LOBECTOMY  01/24/2011    RIGHT  UPPER LOBE  (SQUAMOUS CELL CARCINOMA) Troy Troy [name] , [facility]. No chemotherapyor radiation  . PATCH ANGIOPLASTY Right 07/14/2013   Procedure: PATCH ANGIOPLASTY;  Surgeon: Troy S Dickson, MD;  [facility];  Service: Vascular;  Laterality: Right;  . PATCH ANGIOPLASTY Left 08/30/2015   Procedure: VEIN PATCH ANGIOPLASTY OF PROXIMAL Left BYPASS GRAFT;  Surgeon: Troy S Dickson, MD;  [facility];  Service: Vascular;  Laterality: Left;  . PERIPHERAL VASCULAR CATHETERIZATION N/A 08/29/2015   Procedure: Abdominal Aortogram;  Surgeon: Troy S Dickson, MD;  [facility];  Service: Cardiovascular;  Laterality: N/A;  . trabecular surgery     OS  . TRANSTHORACIC ECHOCARDIOGRAM  12-29-2012  Troy [name]   MILD LVH/  LVSF NORMAL/ EF 55-60%/  GRADE I DIASTOLIC DYSFUNCTION    Current Outpatient Medications  Medication Sig Dispense Refill  . acetaminophen (TYLENOL) 325 MG tablet Take 650 mg every 6 (six) hours   as needed by mouth for moderate pain or headache.    . amiodarone (PACERONE) 200 MG tablet TAKE 1/2 TABLET EVERY DAY. (Patient taking differently: TAKE 1/2 TABLET (100 mg) by mouth EVERY DAY.) 15 tablet 6  . bimatoprost (LUMIGAN) 0.01 % SOLN Place 1 drop at bedtime into both eyes.     . Biotin 10000 MCG TABS Take 10,000 mcg daily by mouth.     . budesonide-formoterol (SYMBICORT) 160-4.5 MCG/ACT inhaler Inhale 2 puffs into the lungs 2 (two) times daily. 1 Inhaler 12  . CARDURA 4 MG tablet TAKE (1/2) TABLET DAILY. (Patient taking differently: TAKE 1/2 TABLET (2mg) by mouth DAILY.) 15 tablet 5  . dorzolamide-timolol (COSOPT) 22.3-6.8 MG/ML ophthalmic solution Place 2 drops 2 (two) times daily into the right eye.     . ELIQUIS 5 MG TABS tablet TAKE 1 TABLET BY MOUTH TWICE DAILY. (Patient taking differently: TAKE 1 TABLET (5mg)BY MOUTH TWICE DAILY.) 60 tablet 2  . FLUoxetine (PROZAC) 20 MG capsule TAKE (1) CAPSULE DAILY. (Patient taking differently: Take 20mg by mouth every  day) 30 capsule 5  . fluticasone (FLONASE) 50 MCG/ACT nasal spray Place 2 sprays into both nostrils daily.  0  . Hypertonic Nasal Wash (SINUS RINSE KIT NA) Place 1 application into the nose daily as needed (congestion).    . iron polysaccharides (POLY-IRON 150) 150 MG capsule Take 150 mg by mouth daily.    . Melatonin 5 MG TABS Take 5 mg by mouth at bedtime.    . montelukast (SINGULAIR) 10 MG tablet Take 1 tablet (10 mg total) by mouth at bedtime. 30 tablet 5  . Multiple Vitamin (MULTIVITAMIN WITH MINERALS) TABS tablet Take 1 tablet by mouth daily. Centrum Silver    . Polyethyl Glycol-Propyl Glycol (SYSTANE OP) Place 1-2 drops as needed into both eyes (for dry eyes).    . potassium chloride SA (K-DUR,KLOR-CON) 20 MEQ tablet TAKE 1 TABLET ONCE DAILY. (Patient taking differently: Take 20 MEQ by mouth every day) 30 tablet 2  . ranitidine (ZANTAC) 150 MG tablet Take 150 mg by mouth daily as needed for heartburn.     . rosuvastatin (CRESTOR) 40 MG tablet TAKE 1 TABLET ONCE DAILY. (Patient taking differently: Take 40mg by mouth every day) 30 tablet 2  . furosemide (LASIX) 20 MG tablet Take 1 tablet (20 mg total) by mouth daily. 30 tablet 11   No current facility-administered medications for this encounter.     Allergies  Allergen Reactions  . Penicillins Hives, Shortness Of Breath and Other (See Comments)    Flushing  & dyspnea Because of a history of documented adverse serious drug reaction;Medi Alert bracelet  is recommended PCN reaction causing immediate rash, facial/tongue/throat swelling, SOB or lightheadedness with hypotension: Yes PCN reaction causing severe rash involving mucus membranes or skin necrosis: No PCN reaction occurring within the last 10 years: NO PCN reaction that required hospitalization: NO  . Sulfonamide Derivatives Hives, Shortness Of Breath and Other (See Comments)    Flushing & dyspnea Because of a history of documented adverse serious drug reaction;Medi Alert bracelet   is recommended  . Niacin-Lovastatin Er Other (See Comments)    Dyspnea, flushing  . Atorvastatin Other (See Comments)    Myalgias & athralgias- takes Crestor    Social History   Socioeconomic History  . Marital status: Married    Spouse name: Lacy   . Number of children: 3  . Years of education: 12+  . Highest education level: Not on file  Occupational History  .   Not on file  Social Needs  . Financial resource strain: Not on file  . Food insecurity:    Worry: Not on file    Inability: Not on file  . Transportation needs:    Medical: Not on file    Non-medical: Not on file  Tobacco Use  . Smoking status: Former Smoker    Packs/day: 0.50    Years: 50.00    Pack years: 25.00    Types: Cigarettes    Last attempt to quit: 07/29/2011    Years since quitting: 6.5  . Smokeless tobacco: Never Used  Substance and Sexual Activity  . Alcohol use: Yes    Alcohol/week: 0.0 oz    Comment:  socially, variable  . Drug use: No  . Sexual activity: Not on file  Lifestyle  . Physical activity:    Days per week: Not on file    Minutes per session: Not on file  . Stress: Not on file  Relationships  . Social connections:    Talks on phone: Not on file    Gets together: Not on file    Attends religious service: Not on file    Active member of club or organization: Not on file    Attends meetings of clubs or organizations: Not on file    Relationship status: Not on file  . Intimate partner violence:    Fear of current or ex partner: Not on file    Emotionally abused: Not on file    Physically abused: Not on file    Forced sexual activity: Not on file  Other Topics Concern  . Not on file  Social History Narrative   Patient lives at home with spouse Lacy   Patient has 3 children    Patient is right handed     Family History  Problem Relation Age of Onset  . Stroke Mother        mini strokes  . Alcohol abuse Father   . Heart disease Father        MI after 55  . Stroke  Father   . Hypertension Father   . Heart attack Father   . Heart disease Paternal Aunt        several  . Hypertension Paternal Aunt        several  . Stroke Paternal Aunt        several  . Stroke Paternal Uncle        several  . Heart disease Paternal Uncle        several;2 had MI pre 55    ROS- All systems are reviewed and negative except as per the HPI above  Physical Exam: Vitals:   02/05/18 1537  Weight: 201 lb (91.2 kg)  Height: 6' 1" (1.854 m)   Wt Readings from Last 3 Encounters:  02/05/18 201 lb (91.2 kg)  02/01/18 205 lb 0.4 oz (93 kg)  01/20/18 202 lb 6.4 oz (91.8 kg)    Labs: Lab Results  Component Value Date   NA 139 01/30/2018   K 4.1 01/30/2018   CL 105 01/30/2018   CO2 25 01/30/2018   GLUCOSE 119 (H) 01/30/2018   BUN 25 (H) 01/30/2018   CREATININE 1.31 (H) 01/30/2018   CALCIUM 8.4 (L) 01/30/2018   PHOS 3.7 01/30/2018   MG 2.6 (H) 01/30/2018   Lab Results  Component Value Date   INR 1.16 08/05/2017   Lab Results  Component Value Date   CHOL 105 10/25/2017     HDL 35 (L) 10/25/2017   LDLCALC 50 10/25/2017   TRIG 101 10/25/2017     GEN- The patient is well appearing, alert and oriented x 3 today.   Head- normocephalic, atraumatic Eyes-  Sclera clear, conjunctiva pink Ears- hearing intact Oropharynx- clear Neck- supple, no JVP Lymph- no cervical lymphadenopathy Lungs- Clear to ausculation bilaterally, normal work of breathing Heart- Regular rate and rhythm, no murmurs, rubs or gallops, PMI not laterally displaced GI- soft, NT, ND, + BS Extremities- no clubbing, cyanosis, or edema MS- no significant deformity or atrophy Skin- no rash or lesion Psych- euthymic mood, full affect Neuro- strength and sensation are intact  EKG- atrial flutter at 54 bpm    Assessment and Plan: 1. Falls 2/2 orthostatic hypotension Not orthostatic today URI cleared and pt improved, no further falls Troy. McLean also spoke to pt today and he will continue  to hold cardura, cardizem but resume 20 mg lasix daily IF he starts having trouble urinating, he may return to cardura use  2. Paroxysmal afib Pt continues in a slow atrial flutter  He will be set up for cardioversion with Troy. McLean in the near future He states no missed does of eliquis 5 mg bid, chadsvasc score of  at least 3  F/u with Troy. McLean 5/28  Kresha Abelson C. Allycia Pitz, ANP-C [facility] [address] [place], [place] [address] [phone]   

## 2018-02-07 ENCOUNTER — Other Ambulatory Visit (HOSPITAL_COMMUNITY): Payer: Self-pay | Admitting: *Deleted

## 2018-02-07 ENCOUNTER — Telehealth (HOSPITAL_COMMUNITY): Payer: Self-pay | Admitting: *Deleted

## 2018-02-07 DIAGNOSIS — I4819 Other persistent atrial fibrillation: Secondary | ICD-10-CM

## 2018-02-07 LAB — CUP PACEART REMOTE DEVICE CHECK
Date Time Interrogation Session: 20190407003538
MDC IDC PG IMPLANT DT: 20161111

## 2018-02-07 NOTE — Telephone Encounter (Signed)
Went over cardioversion instructions with Nathaneil Canary - he is unsure if patient may have potentally missed a dose of eliquis prior to being in the hospital and would like TEE added to ensure safety to cardiovert pt. TEE added onto cardioversion for 5/17.

## 2018-02-09 NOTE — Patient Instructions (Addendum)
  Test(s) ordered today. Your results will be released to [place] (or called to you) after review, usually within 72hours after test completion. If any changes need to be made, you will be notified at that same time.   Medications reviewed and updated.  No changes recommended at this time.    Please followup in August as scheduled

## 2018-02-09 NOTE — Progress Notes (Signed)
Subjective:    Patient ID: Troy Kenner [name], male    DOB: May 16, 1941, 77 y.o.   MRN: [id number]  HPI The patient is here for follow up from the hospital.  Admitted 01/29/18 - 02/01/18 for recurrent syncopal episodes secondary to orthostasis.   Recommendations for Outpatient Follow-up:  1. Follow up with PCP in 1-2 weeks 2. Please obtain BMP/CBC in one week   He was taken to the ED after multiple falls and weakness for a few days.    He had been recently started on steroids and doxycyline by pulmonary for an infection.  He fell the day he went to the ED and suffered a laceration on his left forehead.  Ct of the head was negative.  His family noted increased tremors in his upper extremities.  He denied chest pain, SOB, nausea.  His labs were at baseline, except elevated Cr.  He was orthostatic in the ED.    Syncope: Secondary to orthostatic dehydration being on lasix and decreased po intake He had PT in the hospital and is doing outpatient PT Diltiazem was decreased Lasix continued at 61m and doxazosin at 2 mg He has mild intermittent lightheadedness when standing after laying down for long periods - this is better and not daily He denies falls and feels he has gotten stronger  Afib, Aflutter: persistent in the hospital, typically paroxysmal Saw cardi[place] On apixaban, amiodarone  Chronic diastolic CHF: Compensated Lasix held initially, but restarted after discharge  Hypertension: Diltiazem discontinued due to orthostasis  COPD, hyperlipidemia, h/o AAA: Stable   Medications and allergies reviewed with patient and updated if appropriate.  Patient Active Problem List   Diagnosis Date Noted  . Orthostatic hypotension   . Syncope 01/29/2018  . Chronic diastolic CHF (congestive heart failure) ([facility] 05/04/2017  . CAD (coronary artery disease) 05/03/2017  . MGUS (monoclonal gammopathy of unknown significance)  03/07/2016  . Benign essential HTN 01/19/2016  . Atherosclerotic peripheral vascular disease ([place] 08/29/2015  . OSA (obstructive sleep apnea) 08/29/2015  . PAF (paroxysmal atrial fibrillation) ([facility]   . Glaucoma 06/22/2015  . Dyspnea 02/04/2015  . Memory loss, short term 08/03/2014  . Pain of right lower leg- and Left Thigh and leg 07/28/2014  . Anemia, unspecified 12/28/2013  . Carotid stenosis 07/24/2013  . Occlusion and stenosis of carotid artery without mention of cerebral infarction 07/09/2013  . COPD GOLD II  07/09/2013  . Near syncope 07/07/2013  . Chronic renal disease, stage 3, moderately decreased glomerular filtration rate between 30-59 mL/min/1.73 square meter (HCC) 07/06/2013  . TIA (transient ischemic attack) 07/06/2013  . Chest pain 12/01/2012  . Fatigue 12/01/2012  . Right carotid bruit 12/01/2012  . Compulsive tobacco user syndrome 10/16/2012  . Basal cell carcinoma of skin 10/10/2012  . History of lung cancer 03/11/2012  . Lung nodule, multiple 03/11/2012  . PAD (peripheral artery disease) ([facility] 12/16/2011  . AAA (abdominal aortic aneurysm) (H[facility] 09/27/2011  . Squamous cell lung cancer ([place] 11/08/2010  . GAIT IMBALANCE 03/02/2009  . Prediabetes 03/02/2009  . COLONIC POLYPS, HX OF 03/02/2009  . OBSTRUCTIVE CHRONIC BRONCHITIS WITHOUT EXACERBAT 01/15/2008  . HYPERPLASIA PROSTATE UNS W/O UR OBST & OTH LUTS 01/15/2008  . HYPERLIPIDEMIA 09/29/2007  . Peripheral vascular disease ([facility] 09/29/2007  . LEUKOPLAKIA, VOCAL CORDS 09/29/2007  . FEMORAL BRUIT 09/29/2007    Current Outpatient Medications on File Prior to Visit  Medication Sig Dispense Refill  . acetaminophen (TYLENOL) 325 MG  tablet Take 650 mg every 6 (six) hours as needed by mouth for moderate pain or headache.    [name] amiodarone (PACERONE) 200 MG tablet TAKE 1/2 TABLET EVERY DAY. (Patient taking differently: TAKE 1/2 TABLET (100 mg) by mouth EVERY DAY.) 15 tablet 6  . bimatoprost (LUMIGAN) 0.01 % SOLN Place  1 drop at bedtime into both eyes.     . Biotin 10000 MCG TABS Take 10,000 mcg daily by mouth.     . budesonide-formoterol (SYMBICORT) 160-4.5 MCG/ACT inhaler Inhale 2 puffs into the lungs 2 (two) times daily. 1 Inhaler 12  . CARDURA 4 MG tablet TAKE (1/2) TABLET DAILY. (Patient taking differently: TAKE 1/2 TABLET (4m) by mouth DAILY.) 15 tablet 5  . dorzolamide-timolol (COSOPT) 22.3-6.8 MG/ML ophthalmic solution Place 2 drops 2 (two) times daily into the right eye.     .Marland KitchenELIQUIS 5 MG TABS tablet TAKE 1 TABLET BY MOUTH TWICE DAILY. (Patient taking differently: TAKE 1 TABLET (54mBY MOUTH TWICE DAILY.) 60 tablet 2  . FLUoxetine (PROZAC) 20 MG capsule TAKE (1) CAPSULE DAILY. (Patient taking differently: Take 2047my mouth every day) 30 capsule 5  . fluticasone (FLONASE) 50 MCG/ACT nasal spray Place 2 sprays into both nostrils daily.  0  . furosemide (LASIX) 20 MG tablet Take 1 tablet (20 mg total) by mouth daily. 30 tablet 11  . Hypertonic Nasal Wash (SINUS RINSE KIT NA) Place 1 application into the nose daily as needed (congestion).    . iron polysaccharides (POLY-IRON 150) 150 MG capsule Take 150 mg by mouth daily.    . Melatonin 5 MG TABS Take 5 mg by mouth at bedtime.    . montelukast (SINGULAIR) 10 MG tablet Take 1 tablet (10 mg total) by mouth at bedtime. 30 tablet 5  . Multiple Vitamin (MULTIVITAMIN WITH MINERALS) TABS tablet Take 1 tablet by mouth daily. Centrum Silver    . Polyethyl Glycol-Propyl Glycol (SYSTANE OP) Place 1-2 drops as needed into both eyes (for dry eyes).    . potassium chloride SA (K-DUR,KLOR-CON) 20 MEQ tablet TAKE 1 TABLET ONCE DAILY. (Patient taking differently: Take 20 MEQ by mouth every day) 30 tablet 2  . ranitidine (ZANTAC) 150 MG tablet Take 150 mg by mouth daily as needed for heartburn.     . rosuvastatin (CRESTOR) 40 MG tablet TAKE 1 TABLET ONCE DAILY. (Patient taking differently: Take 64m47m mouth every day) 30 tablet 2   No current facility-administered  medications on file prior to visit.     Past Medical History:  Diagnosis Date  . AAA (abdominal aortic aneurysm) (HCC) LAST ABD. [place]  [place] 2014  3.4CM   MONITORED BY DR DICKScot DockArthritis   . Atrial fibrillation (HCC)[place]. Benign essential HTN 01/19/2016  . Bilateral carotid artery stenosis DUPLEX 12-29-2012  [facility]OS[facility] 60-79%  . BPH (benign prostatic hypertrophy)   . Cancer (HCC)[place]26-13   LUNG  . Complication of anesthesia    had nausea after carotid artery surgery, states the medicine given to help the nausea, made it worse  . COPD (chronic obstructive pulmonary disease) (HCC)[place]. Exertional dyspnea   . Frequency of urination   . GERD (gastroesophageal reflux disease)   . Glaucoma BOTH EYES   Dr ShapGershon CraneGlaucoma   . History of basal cell carcinoma excision   . History of lung cancer APRIL 2012  SQUAMOUS CELL---- S/P RIGHT LOWER LOBECTOMY AT [facility] --  NO  CHEMORADIATION---  NO RECURRENCE    ONCOLOGIST- DR Tressie Stalker  LOV IN [facility] 10-27-2012  . Hx of adenomatous colonic polyps 2005    X 2; 1 hyperplastic polyp; Dr Olevia Perches  . Hyperlipidemia   . Hypertension   . Impaired fasting glucose 2007   108; A1c5.4%  . Lesion of bladder   . Microhematuria   . Nocturia   . OSA (obstructive sleep apnea) 08/29/2015   Mild with AHI overall 6/hr but severe during REM sleep with AHI 30/hr with oxygen saturations < 88% for 5.5 minutes of total sleep time.    [name] PAD (peripheral artery disease) ([place]) ABI'S  JAN 2014  0.65 ON RIGHT ;  1.04 ON LEFT  . Peripheral vascular disease ([place]) S/P ANGIOPLASTY AND STENTING   FOLLOWED  BY DR [name]  . Spinal stenosis Sept. 2015  . Stroke [facility]) Jul 07, 2013   mini  TIA  . Urgency of urination     Past Surgical History:  Procedure Laterality Date  . ANGIO PLASTY     X 4 in legs  . AORTOGRAM  07-27-2002   MILD DIFFUSE ILIAC ARTERY OCCLUSIVE DISEASE /  LEFT RENAL ARTERY 20%/ PATENT LEFT FEM-POP GRAFT/ MILD SFA AND POPLITEAL  ARTERY OCCLUSIVE DISEASE W/ SEVERE KIDNEY OCCLUSIVE DISEASE  . BASAL CELL CARCINOMA EXCISION     MULTIPLE TIMES--  RIGHT FOREARM, CHEEKS, AND BACK  . CARDIOVASCULAR STRESS TEST  12-08-2012  DR MCLEAN   NORMAL LEXISCAN WITH NO EXERCISE NUCLEAR STUDY/ EF 66%/   NO ISCHEMIA/ NO SIGNIFICANT CHANGE FROM PRIOR STUDY  . CAROTID ANGIOGRAM N/A 07/10/2013   Procedure: CAROTID ANGIOGRAM;  Surgeon: Elam Dutch, MD;  [facility] CATH LAB;  Service: Cardiovascular;  Laterality: N/A;  . CAROTID ENDARTERECTOMY Right 07-14-13   cea  . CATARACT EXTRACTION W/ INTRAOCULAR LENS  IMPLANT, BILATERAL    . colon polyectomy    . CYSTOSCOPY W/ RETROGRADES Bilateral 01/21/2013   Procedure: CYSTOSCOPY WITH RETROGRADE PYELOGRAM;  Surgeon: Molli Hazard, MD;  Location[facility];  Service: Urology;  Laterality: Bilateral;   CYSTO, BLADDER BIOPSY, BILATERAL RETROGRADE PYELOGRAM  RAD TECH FROM RADIOLOGY PER JOY  . CYSTOSCOPY WITH BIOPSY N/A 01/21/2013   Procedure: CYSTOSCOPY WITH BIOPSY;  Surgeon: Molli Hazard, MD;  Location[facility];  Service: Urology;  Laterality: N/A;  . ENDARTERECTOMY Right 07/14/2013   Procedure: ENDARTERECTOMY CAROTID;  Surgeon: Angelia Mould, MD;  Location: [place];  Service: Vascular;  Laterality: Right;  . EP IMPLANTABLE DEVICE N/A 08/12/2015   Procedure: Loop Recorder Insertion;  Surgeon: [place] Grayer, MD;  Location: [place] CV LAB;  Service: Cardiovascular;  Laterality: N/A;  . FEMORAL-POPLITEAL BYPASS GRAFT Left [facility]   AND 2001  IN [place]  . FEMORAL-POPLITEAL BYPASS GRAFT Left 08/30/2015   Procedure: REVISION OF BYPASS GRAFT Left  FEMORAL-POPLITEAL ARTERY;  Surgeon: Angelia Mould, MD;  Location: [place];  Service: Vascular;  Laterality: Left;  . LARYNGOSCOPY  06-27-2004   BX VOCAL CORD  (LEUKOPLAKIA)  PER PT NO ISSUES SINCE  . LEFT HEART CATH AND CORONARY ANGIOGRAPHY N/A 08/20/2017   Procedure: LEFT HEART  CATH AND CORONARY ANGIOGRAPHY;  Surgeon: Larey Dresser, MD;  Location: [place] CV LAB;  Service: Cardiovascular;  Laterality: N/A;  . LOWER EXTREMITY ANGIOGRAM Bilateral 08/29/2015   Procedure: Lower Extremity Angiogram;  Surgeon: Angelia Mould, MD;  Location: [place] CV LAB;  Service: Cardiovascular;  Laterality: Bilateral;  . LUNG LOBECTOMY  01/24/2011    RIGHT UPPER LOBE  (SQUAMOUS CELL CARCINOMA) Dr Dorthea Cove , [facility]. No chemotherapyor radiation  . PATCH ANGIOPLASTY Right 07/14/2013   Procedure: PATCH ANGIOPLASTY;  Surgeon: Angelia Mould, MD;  Location: [place];  Service: Vascular;  Laterality: Right;  . PATCH ANGIOPLASTY Left 08/30/2015   Procedure: VEIN PATCH ANGIOPLASTY OF PROXIMAL Left BYPASS GRAFT;  Surgeon: Angelia Mould, MD;  Location: [place];  Service: Vascular;  Laterality: Left;  . PERIPHERAL VASCULAR CATHETERIZATION N/A 08/29/2015   Procedure: Abdominal Aortogram;  Surgeon: Angelia Mould, MD;  Location: [place] CV LAB;  Service: Cardiovascular;  Laterality: N/A;  . trabecular surgery     OS  . TRANSTHORACIC ECHOCARDIOGRAM  12-29-2012  DR [facility]   MILD LVH/  LVSF NORMAL/ EF 61-60%/  GRADE I DIASTOLIC DYSFUNCTION    Social History   Socioeconomic History  . Marital status: Married    Spouse name: [name]   . Number of children: 3  . Years of education: 12+  . Highest education level: Not on file  Occupational History  . Not on file  Social Needs  . Financial resource strain: Not on file  . Food insecurity:    Worry: Not on file    Inability: Not on file  . Transportation needs:    Medical: Not on file    Non-medical: Not on file  Tobacco Use  . Smoking status: Former Smoker    Packs/day: 0.50    Years: 50.00    Pack years: 25.00    Types: Cigarettes    Last attempt to quit: 07/29/2011    Years since quitting: 6.5  . Smokeless tobacco: Never Used  Substance and Sexual Activity  . Alcohol use: Yes    Alcohol/week: 0.0 oz     Comment:  socially, variable  . Drug use: No  . Sexual activity: Not on file  Lifestyle  . Physical activity:    Days per week: Not on file    Minutes per session: Not on file  . Stress: Not on file  Relationships  . Social connections:    Talks on phone: Not on file    Gets together: Not on file    Attends religious service: Not on file    Active member of club or organization: Not on file    Attends meetings of clubs or organizations: Not on file    Relationship status: Not on file  Other Topics Concern  . Not on file  Social History Narrative   Patient lives at home with spouse [name]   Patient has 3 children    Patient is right handed     Family History  Problem Relation Age of Onset  . Stroke Mother        mini strokes  . Alcohol abuse Father   . Heart disease Father        MI after 36  . Stroke Father   . Hypertension Father   . Heart attack Father   . Heart disease Paternal Aunt        several  . Hypertension Paternal Aunt        several  . Stroke Paternal Aunt        several  . Stroke Paternal Uncle        several  . Heart disease Paternal Uncle        several;2 had MI pre 33    Review of Systems  Constitutional: Negative for appetite change, chills and fever.  Respiratory: Positive for cough (allergies) and shortness of breath (with moderation exertion - improved). Negative for wheezing.   Cardiovascular: Negative for chest pain, palpitations and leg swelling.  Gastrointestinal: Negative for abdominal pain and nausea.  Neurological: Positive for light-headedness (when he gets up first thing in the morning). Negative for headaches.       Objective:   Vitals:   02/10/18 1310  BP: 120/66  Pulse: (!) 52  Resp: 16  Temp: 97.6 F (36.4 C)  SpO2: 98%   BP Readings from Last 3 Encounters:  02/10/18 120/66  02/01/18 134/69  01/20/18 124/70   Wt Readings from Last 3 Encounters:  02/10/18 202 lb (91.6 kg)  02/05/18 201 lb (91.2 kg)  02/01/18 205  lb 0.4 oz (93 kg)   Body mass index is 26.65 kg/m.   Physical Exam    Constitutional: Appears well-developed and well-nourished. No distress.  HENT:  Head: Normocephalic and atraumatic.  Neck: Neck supple. No tracheal deviation present. No thyromegaly present.  No cervical lymphadenopathy Cardiovascular: Normal rate, regular rhythm and normal heart sounds.   No murmur heard. No carotid bruit .  No edema Pulmonary/Chest: Effort normal and breath sounds normal. No respiratory distress. No has no wheezes. No rales.  Skin: Skin is warm and dry. Not diaphoretic.  Psychiatric: Normal mood and affect. Behavior is normal.   CUP PACEART REMOTE DEVICE CHECK Carelink summary report received. Battery status OK. Normal device function. No new symptom episodes, tachy episodes, brady, or pause episodes. No new AF episodes, +Eliquis. Monthly summary reports and ROV with JA PRN.Levander Campion BSN, RN, CCDS  Lab Results  Component Value Date   WBC 6.4 02/10/2018   HGB 12.8 (L) 02/10/2018   HCT 38.9 (L) 02/10/2018   PLT 253.0 02/10/2018   GLUCOSE 110 (H) 02/10/2018   CHOL 105 10/25/2017   TRIG 101 10/25/2017   HDL 35 (L) 10/25/2017   LDLCALC 50 10/25/2017   ALT 21 02/10/2018   AST 19 02/10/2018   NA 141 02/10/2018   K 4.6 02/10/2018   CL 106 02/10/2018   CREATININE 1.55 (H) 02/10/2018   BUN 20 02/10/2018   CO2 29 02/10/2018   TSH 1.519 08/05/2017   PSA 1.53 01/15/2008   INR 1.16 08/05/2017   HGBA1C 5.9 11/04/2017   MICROALBUR 0.4 09/29/2007     Assessment & Plan:    See Problem List for Assessment and Plan of chronic medical problems.

## 2018-02-10 ENCOUNTER — Encounter: Payer: Self-pay | Admitting: Internal Medicine

## 2018-02-10 ENCOUNTER — Other Ambulatory Visit (INDEPENDENT_AMBULATORY_CARE_PROVIDER_SITE_OTHER): Payer: Commercial Managed Care - PPO

## 2018-02-10 ENCOUNTER — Ambulatory Visit: Payer: Commercial Managed Care - PPO | Admitting: Internal Medicine

## 2018-02-10 VITALS — BP 120/66 | HR 52 | Temp 97.6°F | Resp 16 | Wt 202.0 lb

## 2018-02-10 DIAGNOSIS — I10 Essential (primary) hypertension: Secondary | ICD-10-CM

## 2018-02-10 DIAGNOSIS — R55 Syncope and collapse: Secondary | ICD-10-CM | POA: Diagnosis not present

## 2018-02-10 DIAGNOSIS — I951 Orthostatic hypotension: Secondary | ICD-10-CM | POA: Diagnosis not present

## 2018-02-10 DIAGNOSIS — I480 Paroxysmal atrial fibrillation: Secondary | ICD-10-CM

## 2018-02-10 LAB — CBC WITH DIFFERENTIAL/PLATELET
BASOS PCT: 0.5 % (ref 0.0–3.0)
Basophils Absolute: 0 10*3/uL (ref 0.0–0.1)
Eosinophils Absolute: 0.1 10*3/uL (ref 0.0–0.7)
Eosinophils Relative: 2 % (ref 0.0–5.0)
HEMATOCRIT: 38.9 % — AB (ref 39.0–52.0)
Hemoglobin: 12.8 g/dL — ABNORMAL LOW (ref 13.0–17.0)
Lymphocytes Relative: 15.1 % (ref 12.0–46.0)
Lymphs Abs: 1 10*3/uL (ref 0.7–4.0)
MCHC: 32.8 g/dL (ref 30.0–36.0)
MCV: 87.9 fl (ref 78.0–100.0)
MONOS PCT: 8.7 % (ref 3.0–12.0)
Monocytes Absolute: 0.6 10*3/uL (ref 0.1–1.0)
NEUTROS ABS: 4.8 10*3/uL (ref 1.4–7.7)
Neutrophils Relative %: 73.7 % (ref 43.0–77.0)
PLATELETS: 253 10*3/uL (ref 150.0–400.0)
RBC: 4.42 Mil/uL (ref 4.22–5.81)
RDW: 16.4 % — AB (ref 11.5–15.5)
WBC: 6.4 10*3/uL (ref 4.0–10.5)

## 2018-02-10 LAB — COMPREHENSIVE METABOLIC PANEL
ALBUMIN: 3.8 g/dL (ref 3.5–5.2)
ALT: 21 U/L (ref 0–53)
AST: 19 U/L (ref 0–37)
Alkaline Phosphatase: 67 U/L (ref 39–117)
BUN: 20 mg/dL (ref 6–23)
CALCIUM: 9.2 mg/dL (ref 8.4–10.5)
CHLORIDE: 106 meq/L (ref 96–112)
CO2: 29 meq/L (ref 19–32)
CREATININE: 1.55 mg/dL — AB (ref 0.40–1.50)
GFR: 46.4 mL/min — ABNORMAL LOW (ref >60.00)
Glucose, Bld: 110 mg/dL — ABNORMAL HIGH (ref 70–99)
Potassium: 4.6 mEq/L (ref 3.5–5.1)
Sodium: 141 mEq/L (ref 135–145)
Total Bilirubin: 0.3 mg/dL (ref 0.2–1.2)
Total Protein: 7 g/dL (ref 6.0–8.3)

## 2018-02-10 MED ORDER — MONTELUKAST SODIUM 10 MG PO TABS: 10.0000 mg | ORAL_TABLET | Freq: Every day | ORAL | 5 refills | Status: DC

## 2018-02-10 NOTE — Assessment & Plan Note (Signed)
BP controlled The current medical regimen but will adjust if orthostatic lightheadedness persists after cardioversion

## 2018-02-10 NOTE — Assessment & Plan Note (Signed)
Resulting in syncope in setting of dehydration Medication adjusted - diltiazem d/c'd and cardura decreased Still with mild lightheadedness when standing but not consistently Drinking more water Will need to monitor BP closely and may need to further adjust meds

## 2018-02-10 NOTE — Addendum Note (Signed)
Encounter addended by: Gaspar Bidding on: 02/10/2018 2:25 PM  Actions taken: Visit Navigator Flowsheet section accepted

## 2018-02-10 NOTE — Assessment & Plan Note (Signed)
Related to dehydration and orthostasis Diltiazem d/c'd cardura decreased to 2 mg Has some lightheadedness occasionally when standing after laying - discussed sitting for a while and then standing still once he gets up Will have cardioversion Friday - may need to adjust meds further if BP on low side and having orthostatic lightheadedness Cmp, cbc

## 2018-02-10 NOTE — Progress Notes (Signed)
Carelink Summary Report / Loop Recorder 

## 2018-02-10 NOTE — Assessment & Plan Note (Signed)
Pafib became persistent and then was in aflutter Scheduled for cardioversion on Friday Taking eliquis Cbc, cmp today asymptomatic

## 2018-02-11 ENCOUNTER — Encounter (HOSPITAL_COMMUNITY): Payer: Commercial Managed Care - PPO

## 2018-02-13 ENCOUNTER — Encounter (HOSPITAL_COMMUNITY): Payer: Commercial Managed Care - PPO

## 2018-02-14 ENCOUNTER — Encounter (HOSPITAL_COMMUNITY): Payer: Self-pay | Admitting: *Deleted

## 2018-02-14 ENCOUNTER — Ambulatory Visit (HOSPITAL_COMMUNITY)
Admission: RE | Admit: 2018-02-14 | Payer: Commercial Managed Care - PPO | Source: Ambulatory Visit | Admitting: Nurse Practitioner

## 2018-02-14 ENCOUNTER — Encounter (HOSPITAL_COMMUNITY)
Admission: RE | Disposition: A | Discharge status: Home / Self Care | Payer: Self-pay | Source: Ambulatory Visit | Attending: Cardiology

## 2018-02-14 ENCOUNTER — Other Ambulatory Visit: Payer: Self-pay

## 2018-02-14 ENCOUNTER — Ambulatory Visit (HOSPITAL_COMMUNITY)
Admission: RE | Admit: 2018-02-14 | Discharge: 2018-02-14 | Disposition: A | Discharge status: Home / Self Care | Payer: Commercial Managed Care - PPO | Source: Ambulatory Visit | Attending: Cardiology | Admitting: Cardiology

## 2018-02-14 ENCOUNTER — Ambulatory Visit (HOSPITAL_BASED_OUTPATIENT_CLINIC_OR_DEPARTMENT_OTHER)
Admission: RE | Admit: 2018-02-14 | Discharge: 2018-02-14 | Disposition: A | Discharge status: Home / Self Care | Payer: Commercial Managed Care - PPO | Source: Ambulatory Visit | Attending: Nurse Practitioner | Admitting: Nurse Practitioner

## 2018-02-14 ENCOUNTER — Encounter (HOSPITAL_COMMUNITY): Payer: Self-pay

## 2018-02-14 ENCOUNTER — Ambulatory Visit (HOSPITAL_COMMUNITY): Payer: Commercial Managed Care - PPO | Admitting: Anesthesiology

## 2018-02-14 DIAGNOSIS — E785 Hyperlipidemia, unspecified: Secondary | ICD-10-CM | POA: Diagnosis not present

## 2018-02-14 DIAGNOSIS — J449 Chronic obstructive pulmonary disease, unspecified: Secondary | ICD-10-CM | POA: Diagnosis not present

## 2018-02-14 DIAGNOSIS — Z888 Allergy status to other drugs, medicaments and biological substances status: Secondary | ICD-10-CM | POA: Insufficient documentation

## 2018-02-14 DIAGNOSIS — I491 Atrial premature depolarization: Secondary | ICD-10-CM | POA: Insufficient documentation

## 2018-02-14 DIAGNOSIS — Z85828 Personal history of other malignant neoplasm of skin: Secondary | ICD-10-CM | POA: Insufficient documentation

## 2018-02-14 DIAGNOSIS — N329 Bladder disorder, unspecified: Secondary | ICD-10-CM | POA: Insufficient documentation

## 2018-02-14 DIAGNOSIS — Z7901 Long term (current) use of anticoagulants: Secondary | ICD-10-CM | POA: Diagnosis not present

## 2018-02-14 DIAGNOSIS — Z79899 Other long term (current) drug therapy: Secondary | ICD-10-CM | POA: Diagnosis not present

## 2018-02-14 DIAGNOSIS — Z880 Allergy status to penicillin: Secondary | ICD-10-CM | POA: Diagnosis not present

## 2018-02-14 DIAGNOSIS — G4733 Obstructive sleep apnea (adult) (pediatric): Secondary | ICD-10-CM | POA: Insufficient documentation

## 2018-02-14 DIAGNOSIS — K219 Gastro-esophageal reflux disease without esophagitis: Secondary | ICD-10-CM | POA: Diagnosis not present

## 2018-02-14 DIAGNOSIS — I4892 Unspecified atrial flutter: Secondary | ICD-10-CM | POA: Diagnosis not present

## 2018-02-14 DIAGNOSIS — Z8249 Family history of ischemic heart disease and other diseases of the circulatory system: Secondary | ICD-10-CM | POA: Insufficient documentation

## 2018-02-14 DIAGNOSIS — I340 Nonrheumatic mitral (valve) insufficiency: Secondary | ICD-10-CM | POA: Diagnosis not present

## 2018-02-14 DIAGNOSIS — Z9841 Cataract extraction status, right eye: Secondary | ICD-10-CM | POA: Insufficient documentation

## 2018-02-14 DIAGNOSIS — H409 Unspecified glaucoma: Secondary | ICD-10-CM | POA: Insufficient documentation

## 2018-02-14 DIAGNOSIS — Z87891 Personal history of nicotine dependence: Secondary | ICD-10-CM | POA: Insufficient documentation

## 2018-02-14 DIAGNOSIS — Z951 Presence of aortocoronary bypass graft: Secondary | ICD-10-CM | POA: Diagnosis not present

## 2018-02-14 DIAGNOSIS — Z8673 Personal history of transient ischemic attack (TIA), and cerebral infarction without residual deficits: Secondary | ICD-10-CM | POA: Insufficient documentation

## 2018-02-14 DIAGNOSIS — I6523 Occlusion and stenosis of bilateral carotid arteries: Secondary | ICD-10-CM | POA: Diagnosis not present

## 2018-02-14 DIAGNOSIS — M1990 Unspecified osteoarthritis, unspecified site: Secondary | ICD-10-CM | POA: Diagnosis not present

## 2018-02-14 DIAGNOSIS — N400 Enlarged prostate without lower urinary tract symptoms: Secondary | ICD-10-CM | POA: Insufficient documentation

## 2018-02-14 DIAGNOSIS — I4819 Other persistent atrial fibrillation: Secondary | ICD-10-CM

## 2018-02-14 DIAGNOSIS — I4891 Unspecified atrial fibrillation: Secondary | ICD-10-CM | POA: Diagnosis not present

## 2018-02-14 DIAGNOSIS — I739 Peripheral vascular disease, unspecified: Secondary | ICD-10-CM | POA: Diagnosis not present

## 2018-02-14 DIAGNOSIS — I051 Rheumatic mitral insufficiency: Secondary | ICD-10-CM | POA: Diagnosis not present

## 2018-02-14 DIAGNOSIS — Z823 Family history of stroke: Secondary | ICD-10-CM | POA: Diagnosis not present

## 2018-02-14 DIAGNOSIS — Z882 Allergy status to sulfonamides status: Secondary | ICD-10-CM | POA: Insufficient documentation

## 2018-02-14 DIAGNOSIS — Z9889 Other specified postprocedural states: Secondary | ICD-10-CM | POA: Insufficient documentation

## 2018-02-14 DIAGNOSIS — Z955 Presence of coronary angioplasty implant and graft: Secondary | ICD-10-CM | POA: Insufficient documentation

## 2018-02-14 DIAGNOSIS — I10 Essential (primary) hypertension: Secondary | ICD-10-CM | POA: Insufficient documentation

## 2018-02-14 DIAGNOSIS — Z7951 Long term (current) use of inhaled steroids: Secondary | ICD-10-CM | POA: Insufficient documentation

## 2018-02-14 DIAGNOSIS — Z85118 Personal history of other malignant neoplasm of bronchus and lung: Secondary | ICD-10-CM | POA: Insufficient documentation

## 2018-02-14 DIAGNOSIS — Z86010 Personal history of colonic polyps: Secondary | ICD-10-CM | POA: Insufficient documentation

## 2018-02-14 DIAGNOSIS — I951 Orthostatic hypotension: Secondary | ICD-10-CM | POA: Insufficient documentation

## 2018-02-14 DIAGNOSIS — I480 Paroxysmal atrial fibrillation: Secondary | ICD-10-CM | POA: Diagnosis not present

## 2018-02-14 DIAGNOSIS — Z8679 Personal history of other diseases of the circulatory system: Secondary | ICD-10-CM | POA: Insufficient documentation

## 2018-02-14 DIAGNOSIS — Z9842 Cataract extraction status, left eye: Secondary | ICD-10-CM | POA: Insufficient documentation

## 2018-02-14 HISTORY — PX: TEE WITHOUT CARDIOVERSION: SHX5443

## 2018-02-14 HISTORY — PX: CARDIOVERSION: SHX1299

## 2018-02-14 SURGERY — CARDIOVERSION
Anesthesia: Monitor Anesthesia Care

## 2018-02-14 MED ORDER — LIDOCAINE HCL (CARDIAC) PF 100 MG/5ML IV SOSY
PREFILLED_SYRINGE | INTRAVENOUS | Status: DC | PRN
  Administered 2018-02-14: 30 mg via INTRAVENOUS

## 2018-02-14 MED ORDER — PROPOFOL 500 MG/50ML IV EMUL
INTRAVENOUS | Status: DC | PRN
  Administered 2018-02-14: 100 ug/kg/min via INTRAVENOUS

## 2018-02-14 MED ORDER — SODIUM CHLORIDE 0.9 % IV SOLN
INTRAVENOUS | Status: DC
  Administered 2018-02-14: 13:00:00 via INTRAVENOUS

## 2018-02-14 MED ORDER — PROPOFOL 10 MG/ML IV BOLUS
INTRAVENOUS | Status: DC | PRN
  Administered 2018-02-14: 20 mg via INTRAVENOUS

## 2018-02-14 NOTE — Anesthesia Postprocedure Evaluation (Signed)
Anesthesia Post Note  Patient: Troy Kenner [name]  Procedure(s) Performed: CARDIOVERSION (N/A ) TRANSESOPHAGEAL ECHOCARDIOGRAM (TEE) (N/A )     Patient location during evaluation: Endoscopy Anesthesia Type: MAC Level of consciousness: awake and alert Pain management: pain level controlled Vital Signs Assessment: post-procedure vital signs reviewed and stable Respiratory status: spontaneous breathing, nonlabored ventilation, respiratory function stable and patient connected to nasal cannula oxygen Cardiovascular status: stable and blood pressure returned to baseline Postop Assessment: no apparent nausea or vomiting Anesthetic complications: no    Last Vitals:  Vitals:   02/14/18 1415 02/14/18 1425  BP: (!) 105/57 126/63  Pulse: (!) 53   Resp: 15 17  Temp:    SpO2: 96% 98%    Last Pain:  Vitals:   02/14/18 1405  TempSrc: Oral  PainSc: 0-No pain                 Lynda Rainwater

## 2018-02-14 NOTE — Transfer of Care (Signed)
Immediate Anesthesia Transfer of Care Note  Patient: Troy Kenner [name]  Procedure(s) Performed: CARDIOVERSION (N/A ) TRANSESOPHAGEAL ECHOCARDIOGRAM (TEE) (N/A )  Patient Location: Endoscopy Unit  Anesthesia Type:MAC  Level of Consciousness: awake and alert   Airway & Oxygen Therapy: Patient Spontanous Breathing and Patient connected to nasal cannula oxygen  Post-op Assessment: Report given to RN and Post -op Vital signs reviewed and stable  Post vital signs: Reviewed and stable  Last Vitals:  Vitals Value Taken Time  BP 100/47 02/14/2018  2:05 PM  Temp 36.4 C 02/14/2018  2:05 PM  Pulse 52 02/14/2018  2:06 PM  Resp 15 02/14/2018  2:06 PM  SpO2 96 % 02/14/2018  2:06 PM  Vitals shown include unvalidated device data.  Last Pain:  Vitals:   02/14/18 1405  TempSrc: Oral  PainSc:          Complications: No apparent anesthesia complications

## 2018-02-14 NOTE — Progress Notes (Signed)
  Echocardiogram Echocardiogram Transesophageal has been performed.  Troy [name] Troy [name] 02/14/2018, 2:51 PM

## 2018-02-14 NOTE — Discharge Instructions (Signed)
TEE ° °YOU HAD AN CARDIAC PROCEDURE TODAY: Refer to the procedure report and other information in the discharge instructions given to you for any specific questions about what was found during the examination. If this information does not answer your questions, please call [facility] office at [phone] to clarify.  ° °DIET: Your first meal following the procedure should be a light meal and then it is ok to progress to your normal diet. A half-sandwich or bowl of soup is an example of a good first meal. Heavy or fried foods are harder to digest and may make you feel nauseous or bloated. Drink plenty of fluids but you should avoid alcoholic beverages for 24 hours. If you had a esophageal dilation, please see attached instructions for diet.  ° °ACTIVITY: Your care partner should take you home directly after the procedure. You should plan to take it easy, moving slowly for the rest of the day. You can resume normal activity the day after the procedure however YOU SHOULD NOT DRIVE, use power tools, machinery or perform tasks that involve climbing or major physical exertion for 24 hours (because of the sedation medicines used during the test).  ° °SYMPTOMS TO REPORT IMMEDIATELY: °A cardiologist can be reached at any hour. Please call [phone] for any of the following symptoms:  °Vomiting of blood or coffee ground material  °New, significant abdominal pain  °New, significant chest pain or pain under the shoulder blades  °Painful or persistently difficult swallowing  °New shortness of breath  °Black, tarry-looking or red, bloody stools ° °FOLLOW UP:  °Please also call with any specific questions about appointments or follow up tests. ° °Electrical Cardioversion, Care After °This sheet gives you information about how to care for yourself after your procedure. Your health care provider may also give you more specific instructions. If you have problems or questions, contact your health care provider. °What can I  expect after the procedure? °After the procedure, it is common to have: °· Some redness on the skin where the shocks were given. ° °Follow these instructions at home: °· Do not drive for 24 hours if you were given a medicine to help you relax (sedative). °· Take over-the-counter and prescription medicines only as told by your health care provider. °· Ask your health care provider how to check your pulse. Check it often. °· Rest for 48 hours after the procedure or as told by your health care provider. °· Avoid or limit your caffeine use as told by your health care provider. °Contact a health care provider if: °· You feel like your heart is beating too quickly or your pulse is not regular. °· You have a serious muscle cramp that does not go away. °Get help right away if: °· You have discomfort in your chest. °· You are dizzy or you feel faint. °· You have trouble breathing or you are short of breath. °· Your speech is slurred. °· You have trouble moving an arm or leg on one side of your body. °· Your fingers or toes turn cold or blue. °This information is not intended to replace advice given to you by your health care provider. Make sure you discuss any questions you have with your health care provider. °Document Released: 07/08/2013 Document Revised: 04/20/2016 Document Reviewed: 03/23/2016 °Elsevier Interactive Patient Education © 2018 [employer] Inc. ° °

## 2018-02-14 NOTE — Anesthesia Preprocedure Evaluation (Signed)
Anesthesia Evaluation  Patient identified by MRN, date of birth, ID band Patient awake    Reviewed: Allergy & Precautions, NPO status , Patient's Chart, lab work & pertinent test results  History of Anesthesia Complications (+) PONV and history of anesthetic complications  Airway Mallampati: II  TM Distance: >3 FB Neck ROM: Full    Dental  (+) Dental Advisory Given   Pulmonary sleep apnea , COPD, former smoker,    breath sounds clear to auscultation       Cardiovascular hypertension, Pt. on medications + Peripheral Vascular Disease  + dysrhythmias Atrial Fibrillation  Rhythm:Regular Rate:Normal     Neuro/Psych TIACVA    GI/Hepatic Neg liver ROS, GERD  ,  Endo/Other  negative endocrine ROS  Renal/GU CRFRenal disease     Musculoskeletal  (+) Arthritis ,   Abdominal   Peds  Hematology  (+) anemia ,   Anesthesia Other Findings   Reproductive/Obstetrics                             Lab Results  Component Value Date   WBC 6.4 02/10/2018   HGB 12.8 (L) 02/10/2018   HCT 38.9 (L) 02/10/2018   MCV 87.9 02/10/2018   PLT 253.0 02/10/2018   Lab Results  Component Value Date   CREATININE 1.55 (H) 02/10/2018   BUN 20 02/10/2018   NA 141 02/10/2018   K 4.6 02/10/2018   CL 106 02/10/2018   CO2 29 02/10/2018    Anesthesia Physical  Anesthesia Plan  ASA: III  Anesthesia Plan: MAC   Post-op Pain Management:    Induction: Intravenous  PONV Risk Score and Plan: Treatment may vary due to age or medical condition  Airway Management Planned: Nasal Cannula  Additional Equipment:   Intra-op Plan:   Post-operative Plan:   Informed Consent: I have reviewed the patients History and Physical, chart, labs and discussed the procedure including the risks, benefits and alternatives for the proposed anesthesia with the patient or authorized representative who has indicated his/her understanding  and acceptance.   Dental advisory given  Plan Discussed with: CRNA  Anesthesia Plan Comments:         Anesthesia Quick Evaluation

## 2018-02-14 NOTE — CV Procedure (Signed)
Procedure: TEE  Indication: Atrial fibrillation  Sedation: Per anesthesiology  Findings: Please see echo section for full report.  Normal LV size with mild LV hypertrophy.  EF 55-60%.  Normal wall motion.  Normal right ventricular size and systolic function.  Mild left atrial enlargement, no LA appendage thrombus.  Normal right atrium. No evidence for PFO or ASD by color doppler.  Trivial TR.  Mild mitral regurgitation.  Trileaflet aortic valve with no stenosis or regurgitation.  Normal caliber thoracic aorta with minimal plaque.   May proceed to DCCV.   Troy [name] 02/14/2018 2:00 PM

## 2018-02-14 NOTE — Procedures (Signed)
Electrical Cardioversion Procedure Note Troy Dunson [name] [id number] 02/10/1941  Procedure: Electrical Cardioversion Indications:  Atrial Fibrillation  Procedure Details Consent: Risks of procedure as well as the alternatives and risks of each were explained to the (patient/caregiver).  Consent for procedure obtained. Time Out: Verified patient identification, verified procedure, site/side was marked, verified correct patient position, special equipment/implants available, medications/allergies/relevent history reviewed, required imaging and test results available.  Performed  Patient placed on cardiac monitor, pulse oximetry, supplemental oxygen as necessary.  Sedation given: Per anesthesiology Pacer pads placed anterior and posterior chest.  Cardioverted 1 time(s).  Cardioverted at [place].  Evaluation Findings: Post procedure EKG shows: NSR Complications: None Patient did tolerate procedure well.   Troy [name] 02/14/2018, 2:00 PM

## 2018-02-16 ENCOUNTER — Encounter (HOSPITAL_COMMUNITY): Payer: Self-pay | Admitting: Cardiology

## 2018-02-16 NOTE — Interval H&P Note (Signed)
History and Physical Interval Note:  02/16/2018 11:09 PM  Troy [name] [name]  has presented today for surgery, with the diagnosis of a fib  The various methods of treatment have been discussed with the patient and family. After consideration of risks, benefits and other options for treatment, the patient has consented to  Procedure(s): CARDIOVERSION (N/A) TRANSESOPHAGEAL ECHOCARDIOGRAM (TEE) (N/A) as a surgical intervention .  The patient's history has been reviewed, patient examined, no change in status, stable for surgery.  I have reviewed the patient's chart and labs.  Questions were answered to the patient's satisfaction.     Salmaan Patchin [employer]

## 2018-02-17 ENCOUNTER — Telehealth: Payer: Self-pay | Admitting: Internal Medicine

## 2018-02-17 DIAGNOSIS — J449 Chronic obstructive pulmonary disease, unspecified: Secondary | ICD-10-CM

## 2018-02-17 NOTE — Telephone Encounter (Signed)
Dr. Melvyn Novas, ok to order pulmonary rehab?  I called Pulm rehab but they are closed for lunch. Will try again later.

## 2018-02-17 NOTE — Telephone Encounter (Signed)
Fine with me

## 2018-02-17 NOTE — Telephone Encounter (Signed)
Referral was made.

## 2018-02-18 ENCOUNTER — Encounter (HOSPITAL_COMMUNITY): Payer: Commercial Managed Care - PPO

## 2018-02-18 NOTE — Addendum Note (Signed)
Addendum  created 02/18/18 0651 by Lynda Rainwater, MD   Intraprocedure Staff edited

## 2018-02-20 ENCOUNTER — Encounter (HOSPITAL_COMMUNITY): Payer: Commercial Managed Care - PPO

## 2018-02-20 NOTE — Addendum Note (Signed)
Encounter addended by: Jewel Baize, RD on: 02/20/2018 12:28 PM  Actions taken: Medication List reviewed, Visit Navigator Flowsheet section accepted

## 2018-02-25 ENCOUNTER — Ambulatory Visit (HOSPITAL_COMMUNITY)
Admission: RE | Admit: 2018-02-25 | Discharge: 2018-02-25 | Disposition: A | Discharge status: Home / Self Care | Payer: Commercial Managed Care - PPO | Source: Ambulatory Visit | Attending: Cardiology | Admitting: Cardiology

## 2018-02-25 ENCOUNTER — Other Ambulatory Visit: Payer: Self-pay

## 2018-02-25 ENCOUNTER — Encounter (HOSPITAL_COMMUNITY): Payer: Self-pay | Admitting: Cardiology

## 2018-02-25 ENCOUNTER — Telehealth (HOSPITAL_COMMUNITY): Payer: Self-pay

## 2018-02-25 ENCOUNTER — Encounter (HOSPITAL_COMMUNITY): Payer: Commercial Managed Care - PPO

## 2018-02-25 VITALS — BP 139/69 | HR 55 | Wt 202.5 lb

## 2018-02-25 DIAGNOSIS — I714 Abdominal aortic aneurysm, without rupture: Secondary | ICD-10-CM | POA: Diagnosis not present

## 2018-02-25 DIAGNOSIS — Z95828 Presence of other vascular implants and grafts: Secondary | ICD-10-CM | POA: Diagnosis not present

## 2018-02-25 DIAGNOSIS — G4733 Obstructive sleep apnea (adult) (pediatric): Secondary | ICD-10-CM | POA: Insufficient documentation

## 2018-02-25 DIAGNOSIS — Z8673 Personal history of transient ischemic attack (TIA), and cerebral infarction without residual deficits: Secondary | ICD-10-CM | POA: Diagnosis not present

## 2018-02-25 DIAGNOSIS — Z87891 Personal history of nicotine dependence: Secondary | ICD-10-CM | POA: Insufficient documentation

## 2018-02-25 DIAGNOSIS — I2510 Atherosclerotic heart disease of native coronary artery without angina pectoris: Secondary | ICD-10-CM | POA: Insufficient documentation

## 2018-02-25 DIAGNOSIS — I130 Hypertensive heart and chronic kidney disease with heart failure and stage 1 through stage 4 chronic kidney disease, or unspecified chronic kidney disease: Secondary | ICD-10-CM | POA: Insufficient documentation

## 2018-02-25 DIAGNOSIS — Z7951 Long term (current) use of inhaled steroids: Secondary | ICD-10-CM | POA: Insufficient documentation

## 2018-02-25 DIAGNOSIS — E785 Hyperlipidemia, unspecified: Secondary | ICD-10-CM | POA: Diagnosis not present

## 2018-02-25 DIAGNOSIS — Z79899 Other long term (current) drug therapy: Secondary | ICD-10-CM | POA: Insufficient documentation

## 2018-02-25 DIAGNOSIS — H409 Unspecified glaucoma: Secondary | ICD-10-CM | POA: Insufficient documentation

## 2018-02-25 DIAGNOSIS — I480 Paroxysmal atrial fibrillation: Secondary | ICD-10-CM | POA: Diagnosis not present

## 2018-02-25 DIAGNOSIS — N183 Chronic kidney disease, stage 3 (moderate): Secondary | ICD-10-CM | POA: Insufficient documentation

## 2018-02-25 DIAGNOSIS — Z902 Acquired absence of lung [part of]: Secondary | ICD-10-CM | POA: Diagnosis not present

## 2018-02-25 DIAGNOSIS — R251 Tremor, unspecified: Secondary | ICD-10-CM | POA: Diagnosis not present

## 2018-02-25 DIAGNOSIS — I739 Peripheral vascular disease, unspecified: Secondary | ICD-10-CM

## 2018-02-25 DIAGNOSIS — Z85118 Personal history of other malignant neoplasm of bronchus and lung: Secondary | ICD-10-CM | POA: Diagnosis not present

## 2018-02-25 DIAGNOSIS — I5032 Chronic diastolic (congestive) heart failure: Secondary | ICD-10-CM

## 2018-02-25 DIAGNOSIS — Z7901 Long term (current) use of anticoagulants: Secondary | ICD-10-CM | POA: Diagnosis not present

## 2018-02-25 DIAGNOSIS — J449 Chronic obstructive pulmonary disease, unspecified: Secondary | ICD-10-CM | POA: Insufficient documentation

## 2018-02-25 NOTE — Progress Notes (Signed)
Patient ID: Troy Collingsworth [name], male   DOB: 01/09/1941, 77 y.o.   MRN: [id number] PCP: Dr. Burns Cardiology: Dr.   77 y.o. with history of HTN, COPD, active smoking/COPD, carotid stenosis s/p right CEA, paroxysmal atrial fibrillation, and PAD returns for followup of CHF and atrial fibrillation.  He does not have known obstructive CAD but is at high risk for CAD based on his comorbidities.  Lexiscan Cardiolite in 3/14 showed no ischemia or infarction and echo in 3/14 showed normal EF.  He had a left fem-pop bypass at the [facility] in the '90s.  He is followed at [facility] for PAD. He has chronic right calf/thigh/buttocks claudication that is unchanged over the last few years and follows regularly at [facility].  He has had right lower lobectomy for lung cancer.  He continues to stay off cigarettes.    At a prior appointment, he was in atrial fibrillation but did not realize it.  I started him on Eliquis and diltiazem CD, and he spontaneously converted to NSR.  In 5/15, he was at [name] and felt "strange" one day: fatigued, weak, short of breath.  He went to the ER and was in atrial fibrillation with HR in 80s-90s.  He spontaneously converted to NSR in the ER.  He felt back to normal after converting to NSR.  I started him on Multaq 400 mg bid.  With CHF, he was eventually transitioned over to amiodarone.   He had patch angioplasty revision of left fem-pop bypass in 11/16.  Now with minimal claudication.  He follows with [facility].     He had a Cardiolite and echo in 7/17 that were unremarkable.  Repeat Cardiolite in 12/17 showed no ischemia, echo was uninterpretable.  Cardiac MRI was therefore done in 1/18, showing EF 66% with normal-appearing RV, no late gadolinium enhancement.   Given increased exertional dyspnea and a defect on Cardiolite, he had LHC in 11/18.  This showed no obstructive CAD. He had a chest CT done given concern for possible amiodarone lung toxicity with increased dyspnea. This showed emphysema,  no ILD.    In 5/19, he developed a probable viral syndrome with dehydration and had a presyncopal episode.  He was orthostatic in the hospital.  He was noted to be in atrial fibrillation this admission which remained persistent.  He had a TEE-guided DCCV back to NSR.   He returns for followup of diastolic CHF and atrial fibrillation.  He is in NSR today.  He feels much better in NSR.  No dyspnea walking on flat ground, but he has some difficulty with balance which he attributes to deterioration in his eyesight. He gets claudication in bilateral legs if he tries to walk fast.  No cramping if he walks slowly and steadily. No further lightheadedness or falls. No chest pain.     ECG (personally reviewed): NSR at 53  Labs (3/13): LDL 75, HDL 35, K 4.1, creatinine 1.1 Labs (3/14): LDL 91, HDL 27 Labs (10/14): K 4.3, creatinine 0.98 Labs (4/15): K 4.9, creatinine 1.1, LDL 76, HDL 30 Labs (5/15): K 4.3, creatinine 1.7, BNP 251 Labs (6/15): K 4.4, creatinine 1.3 Labs (10/15): TSH normal Labs (5/16): K 4.2, creatinine 1.12, HCT 39.5, LFTs normal, LDL 84, HDL 43, TSH normal Labs (11/16): creatinine 0.94 Labs (4/17): LDL 39, HDL 39 Labs (6/17): K 4.5, creatinine 1.2 Labs (9/17): K 4, creatinine 1.25, HCT 38.7 Labs (12/17): K 4.5, creatinine 1.49 => 1.62, BNP 43 Labs (2/18): K 3.8, creatinine 1.33, LFTs   normal, TSH normal Labs (6/18): K 4.3, creatinine 1.49, LFTs normal, TSH normal, hgb 12.3 Labs (11/18): ESR 60, TSH normal, LFTs normal, creatinine 1.34 Labs (1/19): LDL 50 Labs (5/19): K 4.6, creatinine 1.55, LFTs normal, hgb 12.7  PMH: 1. HTN: Fatigue and cough with ramipril use.  2. COPD: Quit smoking 2014. PFTs (1/18) with moderately severe COPD.  - CT chest (11/18): Emphysema noted, no ILD.  3. AAA: CT 1/13 with 3.0 cm AAA.  Abdominal US (1/14) with 3.4 cm AAA. Abdominal US (4/15) with 3.25 x 3.27 AAA. Abdominal US (3/16) with 3.7 cm AAA.  Followed at [facility].  - Abd [name] (1/19): 3.3 cm AAA. 4.  Squamous cell lung cancer diagnosed 2/12.  Had right lower lobectomy in 4/12.  5. Hyperlipidemia 6. PAD: Left fem-pop bypass 1994.  ABIs (2/12) 0.62 on right, 0.95 on left. ABIs (1/14): 0.65 on right, 1.04 on left. ABIs (4/15) 0.66 right, 0.87 left.  ABIs (3/16) 0.6 right, 0.88 left.  Patch angioplasty left fem-pop bypass in 11/16.  - Left fem-pop bypass patent on doppler evaluation in 12/17.  7. Stress myoview 2004 was normal. Lexiscan Cardiolite (3/14) with EF 66%, no ischemia or infarction. Lexiscan Cardiolite (7/17) with EF 61%, no ischemia/infarction.  - Cardiolite (12/17) with EF 62%, inferior/inferolateral fixed defect, most likely diaphragmatic attenuation, no ischemia.  - Cardiolite (9/18) with EF > 65%, fixed inferior defect (attenuation versus infarction), no ischemia.  - LHC (11/18): No obstructive CAD.  8. Chronic diastolic CHF: Echo (8/67): technically difficult with EF 55-60%, upper normal RV size.  Echo (5/16) with EF 60-65%.  Echo (7/17) with EF 55-60%, normal RV size and systolic function.  - Echo 12/17 with very poor windows, unable to comment on LV or RV function.  - Cardiac MRI (1/18) with EF 66%, normal RV size and systolic function.  - TEE (5/19): EF 55-60%, normal RV size and systolic function.  9. Carotid stenosis: TIA 10/14.  Carotid dopplers with > 67% RICA, 20-94% LICA.  Patient had right CEA in 10/14. Carotids (4/15) patent right CEA, LICA 70-96% stenosis.  Carotids (28/36) < 62% LICA, patent right CEA.  - Carotid dopplers (12/17): right CEA ok, < 94% LICA stenosis.  - Carotid dopplers (1/19): Right CEA ok, 7-65% LICA stenosis.  10. Atrial fibrillation: Paroxysmal. First noted after lobectomy in 4/12 (brief), recurrence in 4/15 then in 5/15.  - TEE-guided DCCV for persistent atrial fibrillation in 5/19.  11. Spinal stenosis 12. OSA: Using CPAP.  25. Glaucoma  SH: Married, lives in [place].  Former [employer].  Quit smoking in 2014.  Rare ETOH now.    FH: No premature CAD  ROS: All systems reviewed and negative except as per HPI.   Current Outpatient Medications  Medication Sig Dispense Refill  . acetaminophen (TYLENOL) 325 MG tablet Take 650 mg every 6 (six) hours as needed by mouth for moderate pain or headache.    [name] amiodarone (PACERONE) 200 MG tablet TAKE 1/2 TABLET EVERY DAY. 15 tablet 6  . bimatoprost (LUMIGAN) 0.01 % SOLN Place 1 drop at bedtime into both eyes.     . Biotin 10000 MCG TABS Take 10,000 mcg daily by mouth.     . budesonide-formoterol (SYMBICORT) 160-4.5 MCG/ACT inhaler Inhale 2 puffs into the lungs 2 (two) times daily. 1 Inhaler 12  . CARDURA 4 MG tablet TAKE (1/2) TABLET DAILY. 15 tablet 5  . dorzolamide-timolol (COSOPT) 22.3-6.8 MG/ML ophthalmic solution Place 2 drops 2 (two) times daily into the  right eye.     [name] ELIQUIS 5 MG TABS tablet TAKE 1 TABLET BY MOUTH TWICE DAILY. 60 tablet 2  . FLUoxetine (PROZAC) 20 MG capsule TAKE (1) CAPSULE DAILY. 30 capsule 5  . fluticasone (FLONASE) 50 MCG/ACT nasal spray Place 2 sprays into both nostrils daily.  0  . furosemide (LASIX) 20 MG tablet Take 1 tablet (20 mg total) by mouth daily. 30 tablet 11  . Hypertonic Nasal Wash (SINUS RINSE KIT NA) Place 1 application into the nose daily as needed (congestion).    . iron polysaccharides (POLY-IRON 150) 150 MG capsule Take 150 mg by mouth daily.    . Melatonin 5 MG TABS Take 5 mg by mouth at bedtime.    . montelukast (SINGULAIR) 10 MG tablet Take 1 tablet (10 mg total) by mouth at bedtime. 30 tablet 5  . Multiple Vitamin (MULTIVITAMIN WITH MINERALS) TABS tablet Take 1 tablet by mouth daily. Centrum Silver    . Polyethyl Glycol-Propyl Glycol (SYSTANE OP) Place 1-2 drops as needed into both eyes (for dry eyes).    . potassium chloride SA (K-DUR,KLOR-CON) 20 MEQ tablet TAKE 1 TABLET ONCE DAILY. 30 tablet 2  . ranitidine (ZANTAC) 150 MG tablet Take 150 mg by mouth daily as needed for heartburn.     . rosuvastatin (CRESTOR) 40 MG  tablet TAKE 1 TABLET ONCE DAILY. 30 tablet 2   No current facility-administered medications for this encounter.     BP 139/69   Pulse (!) 55   Wt 202 lb 8 oz (91.9 kg)   SpO2 97%   BMI 26.72 kg/m  General: NAD Neck: No JVD, no thyromegaly or thyroid nodule.  Lungs: Clear to auscultation bilaterally with normal respiratory effort. CV: Nondisplaced PMI.  Heart regular S1/S2, no S3/S4, no murmur.  No peripheral edema.  No carotid bruit.  Unable to palpate pedal pulses.  Abdomen: Soft, nontender, no hepatosplenomegaly, no distention.  Skin: Intact without lesions or rashes.  Neurologic: Alert and oriented x 3. Resting tremor Psych: Normal affect. Extremities: No clubbing or cyanosis.  HEENT: Normal.   Assessment/Plan: 1. Chronic diastolic CHF: TEE in 1/95 showed LV EF 55-60% with normal RV size and systolic function.  He does not look volume overloaded on exam today.  - He can continue Lasix 20 mg daily.  2. Hyperlipidemia: Continue Crestor.  Good lipids in 1/19.  3. Carotid stenosis: s/p R CEA.  Dopplers followed at [facility], stable when done recently.  4. PAD: s/p patch angioplasty to left fem-pop bypass in 11/16.  Followed at [facility]. He is off cilostazol with CHF but no increase in mild stable claudication.  5. AAA: Stable on last [name], followed at [facility].    6. COPD: No longer smoking. PFTs in 1/18 showed moderately severe obstructive defect. CT chest in 11/18 showed emphysema.  He is not on inhalers due to glaucoma.   - He follows with pulmonary. 7. Atrial fibrillation: Patient remains in NSR on amiodarone after DCCV in 5/19.  CHADSVASC 3 (age, vascular disease, HTN).  He is off diltiazem with low BP.   He feels considerably better in NSR compared to atrial fibrillation.  - Continue to limit ETOH.  - Continue to use CPAP.  - Continue Eliquis.   - He is maintaining NSR on amiodarone (had to stop Multaq due to CHF), dose down to 100 mg daily. Recent LFTs normal.  He gets regular eye exams. With  increased dyspnea in the fall, I was concerned for possible amiodarone lung  toxicity. No sign for this on CT chest (suggestive of emphysema) and I had him see pulmonary (Dr Wert) who did not think he had amiodarone lung toxicity.  He has a mild tremor that could be related to amiodarone.  - I would like Mr Madole to be evaluated for atrial fibrillation ablation.  If this is successful, would be able to take him off amiodarone.  Refer to EP.  8. CKD: Stage 3.  Stable recently.  9. CAD: LHC in 11/18 with nonobstructive disease only.    Followup in 3 months.      02/25/2018   

## 2018-02-25 NOTE — Telephone Encounter (Signed)
Scheduled patient for Pulmonary Rehab - Scheduled orientation on 03/21/2018 at 9:30am. Mailed packet.

## 2018-02-25 NOTE — Patient Instructions (Signed)
You have been referred to [name] for Ablation (they will call you)   Your physician recommends that you schedule a follow-up appointment in: 3 months with [name]

## 2018-02-26 DIAGNOSIS — N400 Enlarged prostate without lower urinary tract symptoms: Secondary | ICD-10-CM | POA: Diagnosis not present

## 2018-02-26 DIAGNOSIS — N183 Chronic kidney disease, stage 3 (moderate): Secondary | ICD-10-CM | POA: Diagnosis not present

## 2018-02-26 DIAGNOSIS — I2510 Atherosclerotic heart disease of native coronary artery without angina pectoris: Secondary | ICD-10-CM

## 2018-02-26 DIAGNOSIS — I951 Orthostatic hypotension: Secondary | ICD-10-CM | POA: Diagnosis not present

## 2018-02-26 DIAGNOSIS — J439 Emphysema, unspecified: Secondary | ICD-10-CM | POA: Diagnosis not present

## 2018-02-26 DIAGNOSIS — I70209 Unspecified atherosclerosis of native arteries of extremities, unspecified extremity: Secondary | ICD-10-CM

## 2018-02-26 DIAGNOSIS — Z87891 Personal history of nicotine dependence: Secondary | ICD-10-CM

## 2018-02-26 DIAGNOSIS — Z85828 Personal history of other malignant neoplasm of skin: Secondary | ICD-10-CM

## 2018-02-26 DIAGNOSIS — Z7951 Long term (current) use of inhaled steroids: Secondary | ICD-10-CM

## 2018-02-26 DIAGNOSIS — M4800 Spinal stenosis, site unspecified: Secondary | ICD-10-CM | POA: Diagnosis not present

## 2018-02-26 DIAGNOSIS — G4733 Obstructive sleep apnea (adult) (pediatric): Secondary | ICD-10-CM

## 2018-02-26 DIAGNOSIS — Z9181 History of falling: Secondary | ICD-10-CM

## 2018-02-26 DIAGNOSIS — I739 Peripheral vascular disease, unspecified: Secondary | ICD-10-CM | POA: Diagnosis not present

## 2018-02-26 DIAGNOSIS — E785 Hyperlipidemia, unspecified: Secondary | ICD-10-CM

## 2018-02-26 DIAGNOSIS — Z8673 Personal history of transient ischemic attack (TIA), and cerebral infarction without residual deficits: Secondary | ICD-10-CM

## 2018-02-26 DIAGNOSIS — Z7901 Long term (current) use of anticoagulants: Secondary | ICD-10-CM

## 2018-02-26 DIAGNOSIS — R7303 Prediabetes: Secondary | ICD-10-CM

## 2018-02-26 DIAGNOSIS — K219 Gastro-esophageal reflux disease without esophagitis: Secondary | ICD-10-CM

## 2018-02-26 DIAGNOSIS — I5032 Chronic diastolic (congestive) heart failure: Secondary | ICD-10-CM | POA: Diagnosis not present

## 2018-02-26 DIAGNOSIS — Z85118 Personal history of other malignant neoplasm of bronchus and lung: Secondary | ICD-10-CM

## 2018-02-26 DIAGNOSIS — I6523 Occlusion and stenosis of bilateral carotid arteries: Secondary | ICD-10-CM | POA: Diagnosis not present

## 2018-02-26 DIAGNOSIS — I480 Paroxysmal atrial fibrillation: Secondary | ICD-10-CM

## 2018-02-26 DIAGNOSIS — I130 Hypertensive heart and chronic kidney disease with heart failure and stage 1 through stage 4 chronic kidney disease, or unspecified chronic kidney disease: Secondary | ICD-10-CM | POA: Diagnosis not present

## 2018-02-26 DIAGNOSIS — J383 Other diseases of vocal cords: Secondary | ICD-10-CM

## 2018-02-26 DIAGNOSIS — M1991 Primary osteoarthritis, unspecified site: Secondary | ICD-10-CM | POA: Diagnosis not present

## 2018-02-27 ENCOUNTER — Encounter (HOSPITAL_COMMUNITY): Payer: Commercial Managed Care - PPO

## 2018-03-04 ENCOUNTER — Encounter (HOSPITAL_COMMUNITY): Payer: Commercial Managed Care - PPO

## 2018-03-04 LAB — CUP PACEART REMOTE DEVICE CHECK
Date Time Interrogation Session: 20190510003948
MDC IDC PG IMPLANT DT: 20161111

## 2018-03-06 ENCOUNTER — Encounter (HOSPITAL_COMMUNITY): Payer: Commercial Managed Care - PPO

## 2018-03-11 ENCOUNTER — Ambulatory Visit (INDEPENDENT_AMBULATORY_CARE_PROVIDER_SITE_OTHER): Payer: Commercial Managed Care - PPO | Admitting: *Deleted

## 2018-03-11 DIAGNOSIS — I480 Paroxysmal atrial fibrillation: Secondary | ICD-10-CM | POA: Diagnosis not present

## 2018-03-12 NOTE — Progress Notes (Signed)
Carelink Summary Report / Loop Recorder 

## 2018-03-19 ENCOUNTER — Encounter: Payer: Commercial Managed Care - PPO | Admitting: Internal Medicine

## 2018-03-21 ENCOUNTER — Encounter (HOSPITAL_COMMUNITY)
Admission: RE | Admit: 2018-03-21 | Discharge: 2018-03-21 | Disposition: A | Discharge status: Home / Self Care | Payer: Commercial Managed Care - PPO | Source: Ambulatory Visit | Attending: Internal Medicine | Admitting: Internal Medicine

## 2018-03-21 ENCOUNTER — Encounter (HOSPITAL_COMMUNITY): Payer: Self-pay

## 2018-03-21 VITALS — BP 140/80 | HR 55 | Temp 97.1°F | Resp 20 | Ht 72.25 in | Wt 202.4 lb

## 2018-03-21 DIAGNOSIS — J449 Chronic obstructive pulmonary disease, unspecified: Secondary | ICD-10-CM

## 2018-03-21 NOTE — Progress Notes (Signed)
Troy Kenner [name] 77 y.o. male Pulmonary Rehab Orientation Note Patient who participated in pulmonary rehab this past spring in the undergraduate program arrived today in Cardiac and Pulmonary Rehab for orientation to Pulmonary Rehab. He walked in from the [place] entrance of the hospital.  Pt son brought him to the hospital. He does not  carry portable oxygen. Per pt, he uses oxygen never. Color good, skin warm and dry. Patient is oriented to time and place. Patient's medical history, psychosocial health, and medications reviewed. Psychosocial assessment reveals pt lives with their spouse. Pt is currently retired from [employer], [employer]. Pt hobbies include playing golf. Pt reports his stress level is very low. Areas of stress/anxiety include Health. Pt has diminished vision and can no longer participate in activities such as playing golf competitively, driving and bird hunting.  Pt does not exhibit  signs of depression currently but pt feels he has a history particular when he began to loose his eye sight..  PHQ2/9 score 0/1. Pt shows good  coping skills with positive outlook .  Will continue to monitor and evaluate progress toward psychosocial goal(s) of feeling better physically so that he has the energy to participate in desired activities. Physical assessment reveals heart rate is normal, breath sounds clear to auscultation, no wheezes, rales, or rhonchi, diminished in the lower lobes with no air movement on the right side due to surgery in the past.  Upper lobes clear with no rhonchi or wheezes.  Grip strength equal, strong. Distal pulses palpable. Patient reports he does take medications as prescribed however he stopped taking his symbicort when he got over his last attack. Pt indicated that Dr. Melvyn Novas was aware. Patient states he follows a Regular diet. The patient reports no specific efforts to gain or lose weight. Pt lost weight during his last hospitalization but gained this back when his appetite  improved.  Pt would like to loose weight however pt prefers fried chicken and fish. Patient's weight will be monitored closely. Demonstration and practice of PLB using pulse oximeter. Patient able to return demonstration satisfactorily. Safety and hand hygiene in the exercise area reviewed with patient. Patient voices understanding of the information reviewed. Department expectations discussed with patient and achievable goals were set. The patient shows enthusiasm about attending the program and we look forward to working with this nice patient. The patient is scheduled for a 6 min walk test on 6/25 at 3:00  and to begin exercise on 7/2 at 10:30.  45 minutes was spent on a variety of activities such as assessment of the patient, obtaining baseline data including height, weight, BMI, and grip strength, verifying medical history, allergies, and current medications, and teaching patient strategies for performing tasks with less respiratory effort with emphasis on pursed lip breathing. [id number]. Cherre Huger, BSN Cardiac and Training and development officer

## 2018-03-24 ENCOUNTER — Ambulatory Visit (INDEPENDENT_AMBULATORY_CARE_PROVIDER_SITE_OTHER): Payer: Commercial Managed Care - PPO | Admitting: Internal Medicine

## 2018-03-24 ENCOUNTER — Encounter: Payer: Commercial Managed Care - PPO | Admitting: Internal Medicine

## 2018-03-24 ENCOUNTER — Encounter: Payer: Self-pay | Admitting: Internal Medicine

## 2018-03-24 ENCOUNTER — Other Ambulatory Visit: Payer: Self-pay

## 2018-03-24 VITALS — BP 126/60 | HR 53 | Ht 73.0 in | Wt 203.8 lb

## 2018-03-24 DIAGNOSIS — I4819 Other persistent atrial fibrillation: Secondary | ICD-10-CM

## 2018-03-24 DIAGNOSIS — I481 Persistent atrial fibrillation: Secondary | ICD-10-CM

## 2018-03-24 LAB — CUP PACEART INCLINIC DEVICE CHECK
Date Time Interrogation Session: 20190624153439
Implantable Pulse Generator Implant Date: 20161111

## 2018-03-24 NOTE — Patient Instructions (Signed)
Medication Instructions:  Your physician recommends that you continue on your current medications as directed. Please refer to the Current Medication list given to you today.  Labwork: None ordered     *We will only notify you of abnormal results, otherwise continue current treatment plan.  Testing/Procedures: None ordered  Follow-Up: No follow up is needed at this time with [name].  He will see you on an as needed basis.   * If you need a refill on your cardiac medications before your next appointment, please call your pharmacy.   Thank you for choosing [facility]!!

## 2018-03-24 NOTE — Progress Notes (Signed)
PCP: Binnie Rail, MD Primary Cardiologist: Dr Aundra Dubin Primary EP: Dr Rayann Heman  Troy Kenner [name] is a 77 y.o. male who presents today for routine electrophysiology followup.  Since last being seen in our clinic, the patient reports doing reasonably well.  He has persistent atrial fibrillation.  Only 1 episode in the past year by ILR evaluation today.  He failed medical therapy with multaq and was subsequently placed on amiodarone.  He has done reasonably well but did require cardioversion in May. He reports having less fatigue and improved exercise tolerance with sinus rhythm.   He has tremor.  He also has poor vision due to glaucoma.  Dr Aundra Dubin has asked that we assess for ablation as an option with hopes to get him off of amiodarone long term.  Today, he denies symptoms of palpitations, chest pain, shortness of breath,  lower extremity edema, dizziness, presyncope, or syncope.  The patient is otherwise without complaint today.   Past Medical History:  Diagnosis Date  . AAA (abdominal aortic aneurysm) (HCC) LAST ABD. [name]  JAN 2014  3.4CM   MONITORED BY DR [name]  . Arthritis   . Atrial fibrillation ([place])   . Benign essential HTN 01/19/2016  . Bilateral carotid artery stenosis DUPLEX 12-29-2012  BY DR [facility]   BILATERAL ICA STENOSIS 60-79%  . BPH (benign prostatic hypertrophy)   . Cancer ([place]) 01-25-12   LUNG  . Complication of anesthesia    had nausea after carotid artery surgery, states the medicine given to help the nausea, made it worse  . COPD (chronic obstructive pulmonary disease) ([place])   . Exertional dyspnea   . Frequency of urination   . GERD (gastroesophageal reflux disease)   . Glaucoma BOTH EYES   Dr Gershon Crane  . Glaucoma   . History of basal cell carcinoma excision   . History of lung cancer APRIL 2012  SQUAMOUS CELL---- S/P RIGHT LOWER LOBECTOMY AT [facility] --  NO CHEMORADIATION---  NO RECURRENCE    ONCOLOGIST- DR Tressie Stalker  LOV IN [facility] 10-27-2012  . Hx of adenomatous colonic  polyps 2005    X 2; 1 hyperplastic polyp; Dr Olevia Perches  . Hyperlipidemia   . Hypertension   . Impaired fasting glucose 2007   108; A1c5.4%  . Lesion of bladder   . Microhematuria   . Nocturia   . OSA (obstructive sleep apnea) 08/29/2015   Mild with AHI overall 6/hr but severe during REM sleep with AHI 30/hr with oxygen saturations < 88% for 5.5 minutes of total sleep time.    [name] PAD (peripheral artery disease) ([place]) ABI'S  JAN 2014  0.65 ON RIGHT ;  1.04 ON LEFT  . Peripheral vascular disease ([place]) S/P ANGIOPLASTY AND STENTING   FOLLOWED  BY DR [name]  . Spinal stenosis Sept. 2015  . Stroke [facility]) Jul 07, 2013   mini  TIA  . Urgency of urination    Past Surgical History:  Procedure Laterality Date  . ANGIO PLASTY     X 4 in legs  . AORTOGRAM  07-27-2002   MILD DIFFUSE ILIAC ARTERY OCCLUSIVE DISEASE /  LEFT RENAL ARTERY 20%/ PATENT LEFT FEM-POP GRAFT/ MILD SFA AND POPLITEAL ARTERY OCCLUSIVE DISEASE W/ SEVERE KIDNEY OCCLUSIVE DISEASE  . BASAL CELL CARCINOMA EXCISION     MULTIPLE TIMES--  RIGHT FOREARM, CHEEKS, AND BACK  . CARDIOVASCULAR STRESS TEST  12-08-2012  DR [facility]   NORMAL LEXISCAN WITH NO EXERCISE NUCLEAR STUDY/ EF 66%/   NO ISCHEMIA/ NO SIGNIFICANT  CHANGE FROM PRIOR STUDY  . CARDIOVERSION N/A 02/14/2018   Procedure: CARDIOVERSION;  Surgeon: Larey Dresser, MD;  Location: [facility] ENDOSCOPY;  Service: Cardiovascular;  Laterality: N/A;  . CAROTID ANGIOGRAM N/A 07/10/2013   Procedure: CAROTID ANGIOGRAM;  Surgeon: Elam Dutch, MD;  Location[name] CATH LAB;  Service: Cardiovascular;  Laterality: N/A;  . CAROTID ENDARTERECTOMY Right 07-14-13   cea  . CATARACT EXTRACTION W/ INTRAOCULAR LENS  IMPLANT, BILATERAL    . colon polyectomy    . CYSTOSCOPY W/ RETROGRADES Bilateral 01/21/2013   Procedure: CYSTOSCOPY WITH RETROGRADE PYELOGRAM;  Surgeon: Molli Hazard, MD;  Location: [facility];  Service: Urology;  Laterality: Bilateral;   CYSTO, BLADDER BIOPSY, BILATERAL  RETROGRADE PYELOGRAM  RAD TECH FROM RADIOLOGY PER JOY  . CYSTOSCOPY WITH BIOPSY N/A 01/21/2013   Procedure: CYSTOSCOPY WITH BIOPSY;  Surgeon: Molli Hazard, MD;  Location: [facility];  Service: Urology;  Laterality: N/A;  . ENDARTERECTOMY Right 07/14/2013   Procedure: ENDARTERECTOMY CAROTID;  Surgeon: Angelia Mould, MD;  Location: [place];  Service: Vascular;  Laterality: Right;  . EP IMPLANTABLE DEVICE N/A 08/12/2015   Procedure: Loop Recorder Insertion;  Surgeon: Thompson Grayer, MD;  Location: [place] CV LAB;  Service: Cardiovascular;  Laterality: N/A;  . FEMORAL-POPLITEAL BYPASS GRAFT Left 1994  [facility]   AND 2001  IN [place]  . FEMORAL-POPLITEAL BYPASS GRAFT Left 08/30/2015   Procedure: REVISION OF BYPASS GRAFT Left  FEMORAL-POPLITEAL ARTERY;  Surgeon: Angelia Mould, MD;  Location: [place];  Service: Vascular;  Laterality: Left;  . LARYNGOSCOPY  06-27-2004   BX VOCAL CORD  (LEUKOPLAKIA)  PER PT NO ISSUES SINCE  . LEFT HEART CATH AND CORONARY ANGIOGRAPHY N/A 08/20/2017   Procedure: LEFT HEART CATH AND CORONARY ANGIOGRAPHY;  Surgeon: Larey Dresser, MD;  Location: [place] CV LAB;  Service: Cardiovascular;  Laterality: N/A;  . LOWER EXTREMITY ANGIOGRAM Bilateral 08/29/2015   Procedure: Lower Extremity Angiogram;  Surgeon: Angelia Mould, MD;  Location: [place] CV LAB;  Service: Cardiovascular;  Laterality: Bilateral;  . LUNG LOBECTOMY  01/24/2011    RIGHT UPPER LOBE  (SQUAMOUS CELL CARCINOMA) Dr Dorthea Cove , [facility]. No chemotherapyor radiation  . PATCH ANGIOPLASTY Right 07/14/2013   Procedure: PATCH ANGIOPLASTY;  Surgeon: Angelia Mould, MD;  Location: [place];  Service: Vascular;  Laterality: Right;  . PATCH ANGIOPLASTY Left 08/30/2015   Procedure: VEIN PATCH ANGIOPLASTY OF PROXIMAL Left BYPASS GRAFT;  Surgeon: Angelia Mould, MD;  Location: [place];  Service: Vascular;  Laterality: Left;  . PERIPHERAL VASCULAR CATHETERIZATION  N/A 08/29/2015   Procedure: Abdominal Aortogram;  Surgeon: Angelia Mould, MD;  Location: [place] CV LAB;  Service: Cardiovascular;  Laterality: N/A;  . TEE WITHOUT CARDIOVERSION N/A 02/14/2018   Procedure: TRANSESOPHAGEAL ECHOCARDIOGRAM (TEE);  Surgeon: Larey Dresser, MD;  Location: [facility] ENDOSCOPY;  Service: Cardiovascular;  Laterality: N/A;  . trabecular surgery     OS  . TRANSTHORACIC ECHOCARDIOGRAM  12-29-2012  DR [facility]   MILD LVH/  LVSF NORMAL/ EF 65-78%/  GRADE I DIASTOLIC DYSFUNCTION    ROS- all systems are reviewed and negatives except as per HPI above  Current Outpatient Medications  Medication Sig Dispense Refill  . acetaminophen (TYLENOL) 325 MG tablet Take 650 mg every 6 (six) hours as needed by mouth for moderate pain or headache.    [name] amiodarone (PACERONE) 200 MG tablet TAKE 1/2 TABLET EVERY DAY. 15 tablet 6  . bimatoprost (LUMIGAN) 0.01 %  SOLN Place 1 drop at bedtime into both eyes.     . Biotin 10000 MCG TABS Take 10,000 mcg daily by mouth.     [name] CARDURA 4 MG tablet TAKE (1/2) TABLET DAILY. 15 tablet 5  . dorzolamide-timolol (COSOPT) 22.3-6.8 MG/ML ophthalmic solution Place 2 drops 2 (two) times daily into the right eye.     [name] ELIQUIS 5 MG TABS tablet TAKE 1 TABLET BY MOUTH TWICE DAILY. 60 tablet 2  . FLUoxetine (PROZAC) 20 MG capsule TAKE (1) CAPSULE DAILY. 30 capsule 5  . furosemide (LASIX) 20 MG tablet Take 1 tablet (20 mg total) by mouth daily. 30 tablet 11  . Hypertonic Nasal Wash (SINUS RINSE KIT NA) Place 1 application into the nose daily as needed (congestion).    . iron polysaccharides (POLY-IRON 150) 150 MG capsule Take 150 mg by mouth daily.    . Melatonin 5 MG TABS Take 5 mg by mouth at bedtime.    . Multiple Vitamin (MULTIVITAMIN WITH MINERALS) TABS tablet Take 1 tablet by mouth daily. Centrum Silver    . Polyethyl Glycol-Propyl Glycol (SYSTANE OP) Place 1-2 drops as needed into both eyes (for dry eyes).    . potassium chloride SA (K-DUR,KLOR-CON)  20 MEQ tablet TAKE 1 TABLET ONCE DAILY. 30 tablet 2  . ranitidine (ZANTAC) 150 MG tablet Take 150 mg by mouth daily as needed for heartburn.     . rosuvastatin (CRESTOR) 40 MG tablet TAKE 1 TABLET ONCE DAILY. 30 tablet 2  . budesonide-formoterol (SYMBICORT) 160-4.5 MCG/ACT inhaler Inhale 2 puffs into the lungs 2 (two) times daily. (Patient not taking: Reported on 03/24/2018) 1 Inhaler 12  . fluticasone (FLONASE) 50 MCG/ACT nasal spray Place 2 sprays into both nostrils daily.  0  . montelukast (SINGULAIR) 10 MG tablet Take 1 tablet (10 mg total) by mouth at bedtime. (Patient not taking: Reported on 03/24/2018) 30 tablet 5   No current facility-administered medications for this visit.     Physical Exam: Vitals:   03/24/18 1401  BP: 126/60  Pulse: (!) 53  Weight: 203 lb 12.8 oz (92.4 kg)  Height: '6\' 1"'$  (1.854 m)    GEN- The patient is well appearing, alert and oriented x 3 today.   Head- normocephalic, atraumatic Eyes-  Sclera clear, conjunctiva pink Ears- hearing intact Oropharynx- clear Lungs- Clear to ausculation bilaterally, normal work of breathing Heart- Regular rate and rhythm, no murmurs, rubs or gallops, PMI not laterally displaced GI- soft, NT, ND, + BS Extremities- no clubbing, cyanosis, or edema  Wt Readings from Last 3 Encounters:  03/24/18 203 lb 12.8 oz (92.4 kg)  03/21/18 202 lb 6.1 oz (91.8 kg)  02/25/18 202 lb 8 oz (91.9 kg)    EKG tracing ordered today is personally reviewed and shows sinus rhythm 53 bpm, QRS 74 msec, Qtc 431 msec, nonspecific St/T changes  Assessment and Plan:  1. Persistent afib The patient has symptomatic, recurrent persistent atrial fibrillation. he has failed medical therapy with multaq.  Doing reasonably well with amiodarone.  ILR interrogation today is reviewed and reveals only 1 episode in the past year (AF burden 4.5%) Chads2vasc score is 6.  he is anticoagulated with eliquis. Therapeutic strategies for afib including medicine and  ablation were discussed in detail with the patient today. Risk, benefits, and alternatives to EP study and radiofrequency ablation for afib were also discussed in detail today. At this time, he is not excited about ablation.  He would prefer to continue medical therapy.  He  has an appointment with his ophthalmologist later this week.  If his vision is felt to be impacted by his amiodarone then he may be more willing to consider ablation.  Otherwise, he wishes to continue his current therapy and follow-up with Dr Aundra Dubin.  We discussed ETOh avoidance, compliance with CPAP and regular exercise as important parts of his long term care.   2. HTN Stable No change required today  3. OSA Compliant with CPAP  Follow-up with Dr Aundra Dubin as scheduled Return to see me as needed If he decides to proceed with ablation, he will contact my office.   Thompson Grayer MD, [facility] 03/24/2018 2:20 PM

## 2018-03-25 ENCOUNTER — Encounter (HOSPITAL_COMMUNITY)
Admission: RE | Admit: 2018-03-25 | Discharge: 2018-03-25 | Disposition: A | Discharge status: Home / Self Care | Payer: Commercial Managed Care - PPO | Source: Ambulatory Visit | Attending: Internal Medicine | Admitting: Internal Medicine

## 2018-03-25 ENCOUNTER — Ambulatory Visit (HOSPITAL_COMMUNITY): Payer: Commercial Managed Care - PPO

## 2018-03-25 DIAGNOSIS — J449 Chronic obstructive pulmonary disease, unspecified: Secondary | ICD-10-CM

## 2018-03-25 NOTE — Addendum Note (Signed)
Addended by: Marlis Edelson C on: 03/25/2018 02:58 PM   Modules accepted: Orders

## 2018-03-27 NOTE — Progress Notes (Signed)
Pulmonary Individual Treatment Plan  Patient Details  Name: Troy Sawin [name] MRN: [id number] Date of Birth: November 16, 1940 Referring Provider:     Pulmonary Rehab Walk Test from 03/25/2018 in [place]  Referring Provider  Dr. Melvyn Novas      Initial Encounter Date:    Pulmonary Rehab Walk Test from 03/25/2018 in [place]  Date  03/25/18      Visit Diagnosis: Chronic obstructive pulmonary disease, unspecified COPD type ([place])  Patient's Home Medications on Admission:   Current Outpatient Medications:  .  acetaminophen (TYLENOL) 325 MG tablet, Take 650 mg every 6 (six) hours as needed by mouth for moderate pain or headache., Disp: , Rfl:  .  amiodarone (PACERONE) 200 MG tablet, TAKE 1/2 TABLET EVERY DAY., Disp: 15 tablet, Rfl: 6 .  bimatoprost (LUMIGAN) 0.01 % SOLN, Place 1 drop at bedtime into both eyes. , Disp: , Rfl:  .  Biotin 10000 MCG TABS, Take 10,000 mcg daily by mouth. , Disp: , Rfl:  .  budesonide-formoterol (SYMBICORT) 160-4.5 MCG/ACT inhaler, Inhale 2 puffs into the lungs 2 (two) times daily. (Patient not taking: Reported on 03/24/2018), Disp: 1 Inhaler, Rfl: 12 .  CARDURA 4 MG tablet, TAKE (1/2) TABLET DAILY., Disp: 15 tablet, Rfl: 5 .  dorzolamide-timolol (COSOPT) 22.3-6.8 MG/ML ophthalmic solution, Place 2 drops 2 (two) times daily into the right eye. , Disp: , Rfl:  .  ELIQUIS 5 MG TABS tablet, TAKE 1 TABLET BY MOUTH TWICE DAILY., Disp: 60 tablet, Rfl: 2 .  FLUoxetine (PROZAC) 20 MG capsule, TAKE (1) CAPSULE DAILY., Disp: 30 capsule, Rfl: 5 .  fluticasone (FLONASE) 50 MCG/ACT nasal spray, Place 2 sprays into both nostrils daily., Disp: , Rfl: 0 .  furosemide (LASIX) 20 MG tablet, Take 1 tablet (20 mg total) by mouth daily., Disp: 30 tablet, Rfl: 11 .  Hypertonic Nasal Wash (SINUS RINSE KIT NA), Place 1 application into the nose daily as needed (congestion)., Disp: , Rfl:  .  iron polysaccharides (POLY-IRON 150) 150 MG  capsule, Take 150 mg by mouth daily., Disp: , Rfl:  .  Melatonin 5 MG TABS, Take 5 mg by mouth at bedtime., Disp: , Rfl:  .  montelukast (SINGULAIR) 10 MG tablet, Take 1 tablet (10 mg total) by mouth at bedtime. (Patient not taking: Reported on 03/24/2018), Disp: 30 tablet, Rfl: 5 .  Multiple Vitamin (MULTIVITAMIN WITH MINERALS) TABS tablet, Take 1 tablet by mouth daily. Centrum Silver, Disp: , Rfl:  .  Polyethyl Glycol-Propyl Glycol (SYSTANE OP), Place 1-2 drops as needed into both eyes (for dry eyes)., Disp: , Rfl:  .  potassium chloride SA (K-DUR,KLOR-CON) 20 MEQ tablet, TAKE 1 TABLET ONCE DAILY., Disp: 30 tablet, Rfl: 2 .  ranitidine (ZANTAC) 150 MG tablet, Take 150 mg by mouth daily as needed for heartburn. , Disp: , Rfl:  .  rosuvastatin (CRESTOR) 40 MG tablet, TAKE 1 TABLET ONCE DAILY., Disp: 30 tablet, Rfl: 2  Past Medical History: Past Medical History:  Diagnosis Date  . AAA (abdominal aortic aneurysm) (HCC) LAST ABD. [name]  JAN 2014  3.4CM   MONITORED BY DR [name]  . Arthritis   . Atrial fibrillation ([place])   . Benign essential HTN 01/19/2016  . Bilateral carotid artery stenosis DUPLEX 12-29-2012  BY DR [facility]   BILATERAL ICA STENOSIS 60-79%  . BPH (benign prostatic hypertrophy)   . Cancer ([place]) 01-25-12   LUNG  . Complication of anesthesia    had nausea  after carotid artery surgery, states the medicine given to help the nausea, made it worse  . COPD (chronic obstructive pulmonary disease) ([place])   . Exertional dyspnea   . Frequency of urination   . GERD (gastroesophageal reflux disease)   . Glaucoma BOTH EYES   Dr Gershon Crane  . Glaucoma   . History of basal cell carcinoma excision   . History of lung cancer APRIL 2012  SQUAMOUS CELL---- S/P RIGHT LOWER LOBECTOMY AT [facility] --  NO CHEMORADIATION---  NO RECURRENCE    ONCOLOGIST- DR Tressie Stalker  LOV IN [facility] 10-27-2012  . Hx of adenomatous colonic polyps 2005    X 2; 1 hyperplastic polyp; Dr Olevia Perches  . Hyperlipidemia   . Hypertension   .  Impaired fasting glucose 2007   108; A1c5.4%  . Lesion of bladder   . Microhematuria   . Nocturia   . OSA (obstructive sleep apnea) 08/29/2015   Mild with AHI overall 6/hr but severe during REM sleep with AHI 30/hr with oxygen saturations < 88% for 5.5 minutes of total sleep time.    [name] PAD (peripheral artery disease) ([place]) ABI'S  JAN 2014  0.65 ON RIGHT ;  1.04 ON LEFT  . Peripheral vascular disease ([place]) S/P ANGIOPLASTY AND STENTING   FOLLOWED  BY DR [name]  . Spinal stenosis Sept. 2015  . Stroke [facility]) Jul 07, 2013   mini  TIA  . Urgency of urination     Tobacco Use: Social History   Tobacco Use  Smoking Status Former Smoker  . Packs/day: 0.50  . Years: 50.00  . Pack years: 25.00  . Types: Cigarettes  . Last attempt to quit: 07/29/2011  . Years since quitting: 6.6  Smokeless Tobacco Never Used    Labs: Recent Review Flowsheet Data    Labs for ITP Cardiac and Pulmonary Rehab Latest Ref Rng & Units 01/04/2016 11/27/2016 05/03/2017 10/25/2017 11/04/2017   Cholestrol 0 - 200 mg/dL 95(L) 113 - 105 -   LDLCALC 0 - 99 mg/dL 39 39 - 50 -   HDL >40 mg/dL 39(L) 46 - 35(L) -   Trlycerides <150 mg/dL 86 140 - 101 -   Hemoglobin A1c - - - 5.7 - 5.9   TCO2 0 - 100 mmol/L - - - - -      Capillary Blood Glucose: Lab Results  Component Value Date   GLUCAP 97 08/29/2015   GLUCAP 111 (H) 07/07/2013   GLUCAP 112 (H) 12/22/2010     Pulmonary Assessment Scores: Pulmonary Assessment Scores    Row Name 11/28/17 0841 03/25/18 1543 03/27/18 0730     ADL UCSD   ADL Phase  Entry  Entry  Entry   SOB Score total  -  33  -     CAT Score   CAT Score  -  5  -     mMRC Score   mMRC Score  1  -  0      Pulmonary Function Assessment: Pulmonary Function Assessment - 11/18/17 1243      Pulmonary Function Tests   FEV1%  62 %    FEV1/FVC Ratio  64       Exercise Target Goals: Date: 03/25/18  Exercise Program Goal: Individual exercise prescription set using results from initial 6 min  walk test and THRR while considering  patient's activity barriers and safety.    Exercise Prescription Goal: Initial exercise prescription builds to 30-45 minutes a day of aerobic activity, 2-3 days per week.  Home  exercise guidelines will be given to patient during program as part of exercise prescription that the participant will acknowledge.  Activity Barriers & Risk Stratification: Activity Barriers & Cardiac Risk Stratification - 03/21/18 1014      Activity Barriers & Cardiac Risk Stratification   Activity Barriers  Shortness of Breath;Balance Concerns;History of Falls       6 Minute Walk: 6 Minute Walk    Row Name 11/28/17 [id number] 03/27/18 0724       6 Minute Walk   Phase  Initial  Initial    Distance  1200 feet  1194 feet    Walk Time  6 minutes  6 minutes    # of Rest Breaks  0  0    MPH  2.27  2.26    METS  3.3  2.76    RPE  13  12    Perceived Dyspnea   3  2    Symptoms  Yes (comment)  Yes (comment)    Comments  8/10 calf pain  4/10 calf pain    Resting HR  59 bpm  57 bpm    Resting BP  124/58  126/69    Resting Oxygen Saturation   94 %  98 %    Exercise Oxygen Saturation  during 6 min walk  92 %  94 %    Max Ex. HR  117 bpm  74 bpm    Max Ex. BP  146/60  192/67    2 Minute Post BP  -  152/78      Interval HR   1 Minute HR  98  63    2 Minute HR  98  73    3 Minute HR  96  71    4 Minute HR  117  74    5 Minute HR  117  73    6 Minute HR  70  70    2 Minute Post HR  59  57    Interval Heart Rate?  Yes  Yes      Interval Oxygen   Interval Oxygen?  Yes  -    Baseline Oxygen Saturation %  94 %  98 %    1 Minute Oxygen Saturation %  94 %  96 %    1 Minute Liters of Oxygen  0 L  0 L    2 Minute Oxygen Saturation %  93 %  95 %    2 Minute Liters of Oxygen  0 L  0 L    3 Minute Oxygen Saturation %  92 %  96 %    3 Minute Liters of Oxygen  0 L  0 L    4 Minute Oxygen Saturation %  93 %  94 %    4 Minute Liters of Oxygen  0 L  0 L    5 Minute Oxygen Saturation  %  92 %  95 %    5 Minute Liters of Oxygen  0 L  0 L    6 Minute Oxygen Saturation %  94 %  94 %    6 Minute Liters of Oxygen  0 L  0 L    2 Minute Post Oxygen Saturation %  97 %  98 %    2 Minute Post Liters of Oxygen  0 L  0 L       Oxygen Initial Assessment: Oxygen Initial Assessment - 03/27/18 0724      Initial 6  min Walk   Oxygen Used  None      Program Oxygen Prescription   Program Oxygen Prescription  None       Oxygen Re-Evaluation: Oxygen Re-Evaluation    [place] Name 01/03/18 1025             Program Oxygen Prescription   Program Oxygen Prescription  None         Home Oxygen   Home Oxygen Device  None       Sleep Oxygen Prescription  CPAP       Home Exercise Oxygen Prescription  None       Home at Rest Exercise Oxygen Prescription  None       Compliance with Home Oxygen Use  Yes         Goals/Expected Outcomes   Goals/Expected Outcomes  Cont. to be compliant with CPAP          Oxygen Discharge (Final Oxygen Re-Evaluation): Oxygen Re-Evaluation - 01/03/18 1025      Program Oxygen Prescription   Program Oxygen Prescription  None      Home Oxygen   Home Oxygen Device  None    Sleep Oxygen Prescription  CPAP    Home Exercise Oxygen Prescription  None    Home at Rest Exercise Oxygen Prescription  None    Compliance with Home Oxygen Use  Yes      Goals/Expected Outcomes   Goals/Expected Outcomes  Cont. to be compliant with CPAP       Initial Exercise Prescription: Initial Exercise Prescription - 03/27/18 0700      Date of Initial Exercise RX and Referring Provider   Date  03/25/18    Referring Provider  Dr. Melvyn Novas      Bike   Level  0.6    Minutes  17      NuStep   Level  4    SPM  80    Minutes  17    METs  1.7      Track   Laps  10    Minutes  17      Prescription Details   Frequency (times per week)  2    Duration  Progress to 45 minutes of aerobic exercise without signs/symptoms of physical distress      Intensity   THRR 40-80%  of Max Heartrate  57-114    Ratings of Perceived Exertion  11-13    Perceived Dyspnea  0-4      Progression   Progression  Continue progressive overload as per policy without signs/symptoms or physical distress.      Resistance Training   Training Prescription  Yes    Weight  blue bands    Reps  10-15       Perform Capillary Blood Glucose checks as needed.  Exercise Prescription Changes: Exercise Prescription Changes    Row Name 12/03/17 1100 12/17/17 1300 12/19/17 1300 12/31/17 1200 01/14/18 1220     Response to Exercise   Blood Pressure (Admit)  146/80  -  134/70  150/66  134/60   Blood Pressure (Exercise)  158/60  -  132/58  118/60  148/56   Blood Pressure (Exit)  130/58  -  124/60  130/70  120/62   Heart Rate (Admit)  54 bpm  -  60 bpm  53 bpm  63 bpm   Heart Rate (Exercise)  64 bpm  -  54 bpm  60 bpm  62 bpm   Heart Rate (Exit)  48 bpm  -  59 bpm  49 bpm  57 bpm   Oxygen Saturation (Admit)  97 %  -  95 %  97 %  94 %   Oxygen Saturation (Exercise)  95 %  -  94 %  94 %  93 %   Oxygen Saturation (Exit)  98 %  -  94 %  97 %  97 %   Rating of Perceived Exertion (Exercise)  13  -  _0 Perceived Dyspnea (Exercise)  2  -  _1 Duration  Progress to 45 minutes of aerobic exercise without signs/symptoms of physical distress  -  Continue with 45 min of aerobic exercise without signs/symptoms of physical distress.  Continue with 45 min of aerobic exercise without signs/symptoms of physical distress.  Continue with 45 min of aerobic exercise without signs/symptoms of physical distress.   Intensity  Other (comment)  -  THRR unchanged  THRR unchanged  THRR unchanged     Progression   Progression  - 40-80% HRR  -  Continue to progress workloads to maintain intensity without signs/symptoms of physical distress.  Continue to progress workloads to maintain intensity without signs/symptoms of physical distress.  Continue to progress workloads to maintain intensity without  signs/symptoms of physical distress.     Resistance Training   Training Prescription  Yes  -  Yes  Yes  Yes   Weight  BLUE BANDS  -  Blue bands  Blue bands  Blue bands   Reps  10-15  -  10-15  10-15  10-15   Time  10 Minutes  -  10 Minutes  10 Minutes  10 Minutes     Bike   Level  0.6  -  _2 Minutes  17  -  _3 NuStep   Level  2  -  _4 SPM  80  -  80  80  80   Minutes  17  -  _5 METs  1.6  -  1.8  1.8  1.9     Track   Laps  10  -  _6 Minutes  17  -  _7 Home Exercise Plan   Plans to continue exercise at  -  Home (comment)  -  -  -   Frequency  -  Add 2 additional days to program exercise sessions.  -  -  -      Exercise Comments: Exercise Comments    Row Name 12/17/17 1308 02/10/18 1425         Exercise Comments  Home exercise completed  Patient is now d/c from program due to hospitalization         Exercise Goals and Review: Exercise Goals    [place] Name 03/21/18 1017             Exercise Goals   Increase Physical Activity  Yes       Intervention  Provide advice, education, support and counseling about physical activity/exercise needs.;Develop an individualized exercise prescription for aerobic and resistive training based on initial evaluation findings, risk stratification, comorbidities and participant's personal goals.       Expected Outcomes  Short Term: Attend rehab on a regular  basis to increase amount of physical activity.;Long Term: Add in home exercise to make exercise part of routine and to increase amount of physical activity.;Long Term: Exercising regularly at least 3-5 days a week.       Increase Strength and Stamina  Yes       Intervention  Provide advice, education, support and counseling about physical activity/exercise needs.;Develop an individualized exercise prescription for aerobic and resistive training based on initial evaluation findings, risk stratification, comorbidities and participant's  personal goals.       Expected Outcomes  Short Term: Increase workloads from initial exercise prescription for resistance, speed, and METs.;Short Term: Perform resistance training exercises routinely during rehab and add in resistance training at home;Long Term: Improve cardiorespiratory fitness, muscular endurance and strength as measured by increased METs and functional capacity (6MWT)       Able to understand and use rate of perceived exertion (RPE) scale  Yes       Intervention  Provide education and explanation on how to use RPE scale       Expected Outcomes  Short Term: Able to use RPE daily in rehab to express subjective intensity level;Long Term:  Able to use RPE to guide intensity level when exercising independently       Able to understand and use Dyspnea scale  Yes       Intervention  Provide education and explanation on how to use Dyspnea scale       Expected Outcomes  Short Term: Able to use Dyspnea scale daily in rehab to express subjective sense of shortness of breath during exertion;Long Term: Able to use Dyspnea scale to guide intensity level when exercising independently       Knowledge and understanding of Target Heart Rate Range (THRR)  Yes       Intervention  Provide education and explanation of THRR including how the numbers were predicted and where they are located for reference       Expected Outcomes  Short Term: Able to state/look up THRR;Long Term: Able to use THRR to govern intensity when exercising independently;Short Term: Able to use daily as guideline for intensity in rehab       Understanding of Exercise Prescription  Yes       Intervention  Provide education, explanation, and written materials on patient's individual exercise prescription       Expected Outcomes  Short Term: Able to explain program exercise prescription;Long Term: Able to explain home exercise prescription to exercise independently          Exercise Goals Re-Evaluation : Exercise Goals  Re-Evaluation    Row Name 12/09/17 0736 01/03/18 1026           Exercise Goal Re-Evaluation   Exercise Goals Review  Increase Strength and Stamina;Able to understand and use Dyspnea scale;Increase Physical Activity;Able to understand and use rate of perceived exertion (RPE) scale;Knowledge and understanding of Target Heart Rate Range (THRR);Understanding of Exercise Prescription  Increase Strength and Stamina;Able to understand and use Dyspnea scale;Increase Physical Activity;Able to understand and use rate of perceived exertion (RPE) scale;Knowledge and understanding of Target Heart Rate Range (THRR);Understanding of Exercise Prescription      Comments  Patient has only attended two rehab sessions. Will cont. to monitor and motivate as able.  PAD is a barrier for this patient. He is progressing wel and has started to exercise at home. Patient is able to walk up to 10 laps (200 ft) in 15 minutes. This includes breaks due to  his leg pain. MET level still places him in a "low" functional level. Will cont to monitor patient and progress as able.       Expected Outcomes  Through exercise at rehab, patient will increase strength and stamina and gain enough confidence to start an exercise regime at home.   Through exercise at rehab, patient will increase strength and stamina and gain enough confidence to start an exercise regime at home.          Discharge Exercise Prescription (Final Exercise Prescription Changes): Exercise Prescription Changes - 01/14/18 1220      Response to Exercise   Blood Pressure (Admit)  134/60    Blood Pressure (Exercise)  148/56    Blood Pressure (Exit)  120/62    Heart Rate (Admit)  63 bpm    Heart Rate (Exercise)  62 bpm    Heart Rate (Exit)  57 bpm    Oxygen Saturation (Admit)  94 %    Oxygen Saturation (Exercise)  93 %    Oxygen Saturation (Exit)  97 %    Rating of Perceived Exertion (Exercise)  13    Perceived Dyspnea (Exercise)  1    Duration  Continue with 45  min of aerobic exercise without signs/symptoms of physical distress.    Intensity  THRR unchanged      Progression   Progression  Continue to progress workloads to maintain intensity without signs/symptoms of physical distress.      Resistance Training   Training Prescription  Yes    Weight  Blue bands    Reps  10-15    Time  10 Minutes      Bike   Level  4    Minutes  17      NuStep   Level  5    SPM  80    Minutes  17    METs  1.9      Track   Laps  7    Minutes  17       Nutrition:  Target Goals: Understanding of nutrition guidelines, daily intake of sodium <1521m, cholesterol <2026m calories 30% from fat and 7% or less from saturated fats, daily to have 5 or more servings of fruits and vegetables.  Biometrics: Pre Biometrics - 11/18/17 1248      Pre Biometrics   Grip Strength  28 kg        Nutrition Therapy Plan and Nutrition Goals: Nutrition Therapy & Goals - 02/20/18 1227      Nutrition Therapy   Diet  Heart Healthy      Intervention Plan   Intervention  Prescribe, educate and counsel regarding individualized specific dietary modifications aiming towards targeted core components such as weight, hypertension, lipid management, diabetes, heart failure and other comorbidities.    Expected Outcomes  Short Term Goal: Understand basic principles of dietary content, such as calories, fat, sodium, cholesterol and nutrients.;Long Term Goal: Adherence to prescribed nutrition plan.       Nutrition Assessments: Nutrition Assessments - 02/20/18 1227      Rate Your Plate Scores   Pre Score  42       Nutrition Goals Re-Evaluation:   Nutrition Goals Discharge (Final Nutrition Goals Re-Evaluation):   Psychosocial: Target Goals: Acknowledge presence or absence of significant depression and/or stress, maximize coping skills, provide positive support system. Participant is able to verbalize types and ability to use techniques and skills needed for reducing  stress and depression.  Initial Review & Psychosocial Screening:  Initial Psych Review & Screening - 03/21/18 1010      Initial Review   Current issues with  None Identified;History of Depression diminshed vision caused change in activity level and hobbies      [place]?  Yes      Barriers   Psychosocial barriers to participate in program  There are no identifiable barriers or psychosocial needs.      Screening Interventions   Interventions  Encouraged to exercise    Expected Outcomes  Long Term goal: The participant improves quality of Life and PHQ9 Scores as seen by post scores and/or verbalization of changes;Short Term goal: Identification and review with participant of any Quality of Life or Depression concerns found by scoring the questionnaire.       Quality of Life Scores:  Scores of 19 and below usually indicate a poorer quality of life in these areas.  A difference of  2-3 points is a clinically meaningful difference.  A difference of 2-3 points in the total score of the Quality of Life Index has been associated with significant improvement in overall quality of life, self-image, physical symptoms, and general health in studies assessing change in quality of life.   PHQ-9: Recent Review Flowsheet Data    Depression screen [facility] 2/9 03/21/2018 11/18/2017 11/04/2017 01/12/2015   Decreased Interest 0 0 0 0   Down, Depressed, Hopeless 0 0 0 0   PHQ - 2 Score 0 0 0 0   Altered sleeping 0 - - -   Tired, decreased energy 1 - - -   Change in appetite 0 - - -   Trouble concentrating 0 - - -   Moving slowly or fidgety/restless 0 - - -   Suicidal thoughts 0 - - -   PHQ-9 Score 1 - - -   Difficult doing work/chores Not difficult at all - - -     Interpretation of Total Score  Total Score Depression Severity:  1-4 = Minimal depression, 5-9 = Mild depression, 10-14 = Moderate depression, 15-19 = Moderately severe depression, 20-27 = Severe depression    Psychosocial Evaluation and Intervention: Psychosocial Evaluation - 03/21/18 1012      Psychosocial Evaluation & Interventions   Interventions  Encouraged to exercise with the program and follow exercise prescription    Comments  no psychosocial issues identified    Expected Outcomes  no barriers to participation in pulmonary rehab    Continue Psychosocial Services   Follow up required by staff       Psychosocial Re-Evaluation: Psychosocial Re-Evaluation    Row Name 12/03/17 1238 01/06/18 1010 02/03/18 1029         Psychosocial Re-Evaluation   Current issues with  None Identified  None Identified  None Identified     Comments  -  -  None identified at last face to face encounter during exercise session.     Expected Outcomes  no barriers to participation in pulmonary rehab  no barriers to participation in pulmonary rehab  -     Interventions  Encouraged to attend Pulmonary Rehabilitation for the exercise  Encouraged to attend Pulmonary Rehabilitation for the exercise  -     Continue Psychosocial Services   No Follow up required  Follow up required by staff  -        Psychosocial Discharge (Final Psychosocial Re-Evaluation): Psychosocial Re-Evaluation - 02/03/18 1029      Psychosocial Re-Evaluation   Current issues with  None  Identified    Comments  None identified at last face to face encounter during exercise session.       Education: Education Goals: Education classes will be provided on a weekly basis, covering required topics. Participant will state understanding/return demonstration of topics presented.  Learning Barriers/Preferences: Learning Barriers/Preferences - 03/21/18 1012      Learning Barriers/Preferences   Learning Barriers  Sight;Hearing has aids for hearing but does not use them    Learning Preferences  Individual Instruction;Skilled Demonstration;Verbal Instruction       Education Topics: Risk Factor Reduction:  -Group instruction that is  supported by a PowerPoint presentation. Instructor discusses the definition of a risk factor, different risk factors for pulmonary disease, and how the heart and lungs work together.     Nutrition for Pulmonary Patient:  -Group instruction provided by PowerPoint slides, verbal discussion, and written materials to support subject matter. The instructor gives an explanation and review of healthy diet recommendations, which includes a discussion on weight management, recommendations for fruit and vegetable consumption, as well as protein, fluid, caffeine, fiber, sodium, sugar, and alcohol. Tips for eating when patients are short of breath are discussed.   PULMONARY REHAB CHRONIC OBSTRUCTIVE PULMONARY DISEASE from 01/09/2018 in [place]  Date  12/19/17  Educator  Parke Simmers  Instruction Review Code  2- Demonstrated Understanding      Pursed Lip Breathing:  -Group instruction that is supported by demonstration and informational handouts. Instructor discusses the benefits of pursed lip and diaphragmatic breathing and detailed demonstration on how to preform both.     Oxygen Safety:  -Group instruction provided by PowerPoint, verbal discussion, and written material to support subject matter. There is an overview of "What is Oxygen" and "Why do we need it".  Instructor also reviews how to create a safe environment for oxygen use, the importance of using oxygen as prescribed, and the risks of noncompliance. There is a brief discussion on traveling with oxygen and resources the patient may utilize.   Oxygen Equipment:  -Group instruction provided by [facility] Staff utilizing handouts, written materials, and equipment demonstrations.   Signs and Symptoms:  -Group instruction provided by written material and verbal discussion to support subject matter. Warning signs and symptoms of infection, stroke, and heart attack are reviewed and when to call the physician/911  reinforced. Tips for preventing the spread of infection discussed.   Advanced Directives:  -Group instruction provided by verbal instruction and written material to support subject matter. Instructor reviews Advanced Directive laws and proper instruction for filling out document.   Pulmonary Video:  -Group video education that reviews the importance of medication and oxygen compliance, exercise, good nutrition, pulmonary hygiene, and pursed lip and diaphragmatic breathing for the pulmonary patient.   Exercise for the Pulmonary Patient:  -Group instruction that is supported by a PowerPoint presentation. Instructor discusses benefits of exercise, core components of exercise, frequency, duration, and intensity of an exercise routine, importance of utilizing pulse oximetry during exercise, safety while exercising, and options of places to exercise outside of rehab.     Pulmonary Medications:  -Verbally interactive group education provided by instructor with focus on inhaled medications and proper administration.   PULMONARY REHAB CHRONIC OBSTRUCTIVE PULMONARY DISEASE from 01/09/2018 in [place]  Date  12/26/17  Educator  pharm  Instruction Review Code  2- Demonstrated Understanding      Anatomy and Physiology of the Respiratory System and Intimacy:  -Group instruction provided by  PowerPoint, verbal discussion, and written material to support subject matter. Instructor reviews respiratory cycle and anatomical components of the respiratory system and their functions. Instructor also reviews differences in obstructive and restrictive respiratory diseases with examples of each. Intimacy, Sex, and Sexuality differences are reviewed with a discussion on how relationships can change when diagnosed with pulmonary disease. Common sexual concerns are reviewed.   MD DAY -A group question and answer session with a medical doctor that allows participants to ask questions  that relate to their pulmonary disease state.   PULMONARY REHAB CHRONIC OBSTRUCTIVE PULMONARY DISEASE from 01/09/2018 in [place]  Date  01/09/18  Educator  Dr. Nelda Marseille  Instruction Review Code  1- Verbalizes Understanding      OTHER EDUCATION -Group or individual verbal, written, or video instructions that support the educational goals of the pulmonary rehab program.   PULMONARY REHAB CHRONIC OBSTRUCTIVE PULMONARY DISEASE from 01/09/2018 in [place]  Date  01/02/18 Rodney Booze a sedentary lifestyle]  Educator  EP  Instruction Review Code  1- Verbalizes Understanding      Holiday Eating Survival Tips:  -Group instruction provided by PowerPoint slides, verbal discussion, and written materials to support subject matter. The instructor gives patients tips, tricks, and techniques to help them not only survive but enjoy the holidays despite the onslaught of food that accompanies the holidays.   Knowledge Questionnaire Score: Knowledge Questionnaire Score - 03/25/18 1543      Knowledge Questionnaire Score   Pre Score  16/18       Core Components/Risk Factors/Patient Goals at Admission: Personal Goals and Risk Factors at Admission - 03/21/18 1007      Core Components/Risk Factors/Patient Goals on Admission    Weight Management  Weight Loss;Yes    Intervention  Weight Management: Provide education and appropriate resources to help participant work on and attain dietary goals.;Weight Management: Develop a combined nutrition and exercise program designed to reach desired caloric intake, while maintaining appropriate intake of nutrient and fiber, sodium and fats, and appropriate energy expenditure required for the weight goal.;Weight Management/Obesity: Establish reasonable short term and long term weight goals.;Obesity: Provide education and appropriate resources to help participant work on and attain dietary goals.    Admit Weight   202 lb 6.1 oz (91.8 kg)    Goal Weight: Short Term  195 lb (88.5 kg)    Goal Weight: Long Term  190 lb (86.2 kg)    Expected Outcomes  Short Term: Continue to assess and modify interventions until short term weight is achieved;Long Term: Adherence to nutrition and physical activity/exercise program aimed toward attainment of established weight goal;Weight Loss: Understanding of general recommendations for a balanced deficit meal plan, which promotes 1-2 lb weight loss per week and includes a negative energy balance of [phone] kcal/d;Understanding recommendations for meals to include 15-35% energy as protein, 25-35% energy from fat, 35-60% energy from carbohydrates, less than 2109m of dietary cholesterol, 20-35 gm of total fiber daily;Understanding of distribution of calorie intake throughout the day with the consumption of 4-5 meals/snacks    Improve shortness of breath with ADL's  Yes    Intervention  Provide education, individualized exercise plan and daily activity instruction to help decrease symptoms of SOB with activities of daily living.    Expected Outcomes  Short Term: Improve cardiorespiratory fitness to achieve a reduction of symptoms when performing ADLs;Long Term: Be able to perform more ADLs without symptoms or delay the onset of symptoms  Heart Failure  Yes    Intervention  Provide a combined exercise and nutrition program that is supplemented with education, support and counseling about heart failure. Directed toward relieving symptoms such as shortness of breath, decreased exercise tolerance, and extremity edema.    Expected Outcomes  Improve functional capacity of life;Short term: Attendance in program 2-3 days a week with increased exercise capacity. Reported lower sodium intake. Reported increased fruit and vegetable intake. Reports medication compliance.;Long term: Adoption of self-care skills and reduction of barriers for early signs and symptoms recognition and intervention leading  to self-care maintenance.    Hypertension  Yes    Intervention  Provide education on lifestyle modifcations including regular physical activity/exercise, weight management, moderate sodium restriction and increased consumption of fresh fruit, vegetables, and low fat dairy, alcohol moderation, and smoking cessation.;Monitor prescription use compliance.    Expected Outcomes  Short Term: Continued assessment and intervention until BP is < 140/67m HG in hypertensive participants. < 130/823mHG in hypertensive participants with diabetes, heart failure or chronic kidney disease.;Long Term: Maintenance of blood pressure at goal levels.    Lipids  Yes    Intervention  Provide education and support for participant on nutrition & aerobic/resistive exercise along with prescribed medications to achieve LDL <7048mHDL >43m49m  Expected Outcomes  Short Term: Participant states understanding of desired cholesterol values and is compliant with medications prescribed. Participant is following exercise prescription and nutrition guidelines.;Long Term: Cholesterol controlled with medications as prescribed, with individualized exercise RX and with personalized nutrition plan. Value goals: LDL < 70mg73mL > 40 mg.       Core Components/Risk Factors/Patient Goals Review:  Goals and Risk Factor Review    Row Name 12/03/17 1235 01/06/18 1009 02/03/18 1026         Core Components/Risk Factors/Patient Goals Review   Personal Goals Review  Improve shortness of breath with ADL's  Improve shortness of breath with ADL's  Improve shortness of breath with ADL's     Review  Has just begun program, has exercised once in program, tolerated it well.  Too early to see progression toward goals  patient is doing well in the pulmonary rehab program. he states he can see a significant improvement in his shortness of breath at home with ADLs and with exercise. he has even started exercising outside of rehab focusing not only on aerobic  exercise but stregnth training as well.  patient was doing well in pulmonary rehab until last week when he was admitted to the hospital for multiple falls, dehydration, and URI. He was previously seen by MD for URI and prescribed prednisone with antibiotics. His health continued to decline and he became dehydrated which resulted in several falls with bruising and facial scrapes. He was discharged home yesterday from hospital with HHPT and 24 hour care for safety. He will be discharged from pulmonary rehab and readmitted at a later time once he has recovered from current illness and deconditioning.      Expected Outcomes  see admission goals  see admission goals  -        Core Components/Risk Factors/Patient Goals at Discharge (Final Review):  Goals and Risk Factor Review - 02/03/18 1026      Core Components/Risk Factors/Patient Goals Review   Personal Goals Review  Improve shortness of breath with ADL's    Review  patient was doing well in pulmonary rehab until last week when he was admitted to the hospital for multiple falls, dehydration, and URI.  He was previously seen by MD for URI and prescribed prednisone with antibiotics. His health continued to decline and he became dehydrated which resulted in several falls with bruising and facial scrapes. He was discharged home yesterday from hospital with HHPT and 24 hour care for safety. He will be discharged from pulmonary rehab and readmitted at a later time once he has recovered from current illness and deconditioning.        ITP Comments: ITP Comments    Row Name 03/21/18 0956           ITP Comments  Dr. Jennet Maduro, Medical Director          Comments:

## 2018-04-01 ENCOUNTER — Encounter (HOSPITAL_COMMUNITY)
Admission: RE | Admit: 2018-04-01 | Discharge: 2018-04-01 | Disposition: A | Discharge status: Home / Self Care | Payer: Commercial Managed Care - PPO | Source: Ambulatory Visit | Attending: Internal Medicine | Admitting: Internal Medicine

## 2018-04-01 VITALS — Wt 201.7 lb

## 2018-04-01 DIAGNOSIS — J449 Chronic obstructive pulmonary disease, unspecified: Secondary | ICD-10-CM | POA: Diagnosis present

## 2018-04-01 NOTE — Progress Notes (Signed)
I have reviewed a Home Exercise Prescription with Troy Kenner [name] Troy [name] is currently exercising at home.  The patient was advised to walk 2 days a week for 30 minutes.  Troy [name] and I discussed how to progress their exercise prescription.  The patient stated that their goals were to decrease shortness of breath and pain in legs.  The patient stated that they understand the exercise prescription.  We reviewed exercise guidelines, target heart rate during exercise, RPE Scale, weather conditions, NTG use, endpoints for exercise, warmup and cool down.  Patient is encouraged to come to me with any questions. I will continue to follow up with the patient to assist them with progression and safety.

## 2018-04-01 NOTE — Progress Notes (Signed)
Pulmonary Individual Treatment Plan  Patient Details  Name: Troy [name]. MRN: [id number] Date of Birth: November 16, 1940 Referring Provider:     Pulmonary Rehab Walk Test from 03/25/2018 in [place]  Referring Provider  Dr. Melvyn Novas      Initial Encounter Date:    Pulmonary Rehab Walk Test from 03/25/2018 in [place]  Date  03/25/18      Visit Diagnosis: Chronic obstructive pulmonary disease, unspecified COPD type ([place])  Patient's Home Medications on Admission:   Current Outpatient Medications:  .  acetaminophen (TYLENOL) 325 MG tablet, Take 650 mg every 6 (six) hours as needed by mouth for moderate pain or headache., Disp: , Rfl:  .  amiodarone (PACERONE) 200 MG tablet, TAKE 1/2 TABLET EVERY DAY., Disp: 15 tablet, Rfl: 6 .  bimatoprost (LUMIGAN) 0.01 % SOLN, Place 1 drop at bedtime into both eyes. , Disp: , Rfl:  .  Biotin 10000 MCG TABS, Take 10,000 mcg daily by mouth. , Disp: , Rfl:  .  budesonide-formoterol (SYMBICORT) 160-4.5 MCG/ACT inhaler, Inhale 2 puffs into the lungs 2 (two) times daily. (Patient not taking: Reported on 03/24/2018), Disp: 1 Inhaler, Rfl: 12 .  CARDURA 4 MG tablet, TAKE (1/2) TABLET DAILY., Disp: 15 tablet, Rfl: 5 .  dorzolamide-timolol (COSOPT) 22.3-6.8 MG/ML ophthalmic solution, Place 2 drops 2 (two) times daily into the right eye. , Disp: , Rfl:  .  ELIQUIS 5 MG TABS tablet, TAKE 1 TABLET BY MOUTH TWICE DAILY., Disp: 60 tablet, Rfl: 2 .  FLUoxetine (PROZAC) 20 MG capsule, TAKE (1) CAPSULE DAILY., Disp: 30 capsule, Rfl: 5 .  fluticasone (FLONASE) 50 MCG/ACT nasal spray, Place 2 sprays into both nostrils daily., Disp: , Rfl: 0 .  furosemide (LASIX) 20 MG tablet, Take 1 tablet (20 mg total) by mouth daily., Disp: 30 tablet, Rfl: 11 .  Hypertonic Nasal Wash (SINUS RINSE KIT NA), Place 1 application into the nose daily as needed (congestion)., Disp: , Rfl:  .  iron polysaccharides (POLY-IRON 150) 150 MG  capsule, Take 150 mg by mouth daily., Disp: , Rfl:  .  Melatonin 5 MG TABS, Take 5 mg by mouth at bedtime., Disp: , Rfl:  .  montelukast (SINGULAIR) 10 MG tablet, Take 1 tablet (10 mg total) by mouth at bedtime. (Patient not taking: Reported on 03/24/2018), Disp: 30 tablet, Rfl: 5 .  Multiple Vitamin (MULTIVITAMIN WITH MINERALS) TABS tablet, Take 1 tablet by mouth daily. Centrum Silver, Disp: , Rfl:  .  Polyethyl Glycol-Propyl Glycol (SYSTANE OP), Place 1-2 drops as needed into both eyes (for dry eyes)., Disp: , Rfl:  .  potassium chloride SA (K-DUR,KLOR-CON) 20 MEQ tablet, TAKE 1 TABLET ONCE DAILY., Disp: 30 tablet, Rfl: 2 .  ranitidine (ZANTAC) 150 MG tablet, Take 150 mg by mouth daily as needed for heartburn. , Disp: , Rfl:  .  rosuvastatin (CRESTOR) 40 MG tablet, TAKE 1 TABLET ONCE DAILY., Disp: 30 tablet, Rfl: 2  Past Medical History: Past Medical History:  Diagnosis Date  . AAA (abdominal aortic aneurysm) (HCC) LAST ABD. [name]  JAN 2014  3.4CM   MONITORED BY DR [name]  . Arthritis   . Atrial fibrillation ([place])   . Benign essential HTN 01/19/2016  . Bilateral carotid artery stenosis DUPLEX 12-29-2012  BY DR [facility]   BILATERAL ICA STENOSIS 60-79%  . BPH (benign prostatic hypertrophy)   . Cancer ([place]) 01-25-12   LUNG  . Complication of anesthesia    had nausea  after carotid artery surgery, states the medicine given to help the nausea, made it worse  . COPD (chronic obstructive pulmonary disease) ([place])   . Exertional dyspnea   . Frequency of urination   . GERD (gastroesophageal reflux disease)   . Glaucoma BOTH EYES   Dr Gershon Crane  . Glaucoma   . History of basal cell carcinoma excision   . History of lung cancer APRIL 2012  SQUAMOUS CELL---- S/P RIGHT LOWER LOBECTOMY AT [facility] --  NO CHEMORADIATION---  NO RECURRENCE    ONCOLOGIST- DR Tressie Stalker  LOV IN [facility] 10-27-2012  . Hx of adenomatous colonic polyps 2005    X 2; 1 hyperplastic polyp; Dr Olevia Perches  . Hyperlipidemia   . Hypertension   .  Impaired fasting glucose 2007   108; A1c5.4%  . Lesion of bladder   . Microhematuria   . Nocturia   . OSA (obstructive sleep apnea) 08/29/2015   Mild with AHI overall 6/hr but severe during REM sleep with AHI 30/hr with oxygen saturations < 88% for 5.5 minutes of total sleep time.    [name] PAD (peripheral artery disease) ([place]) ABI'S  JAN 2014  0.65 ON RIGHT ;  1.04 ON LEFT  . Peripheral vascular disease ([place]) S/P ANGIOPLASTY AND STENTING   FOLLOWED  BY DR [name]  . Spinal stenosis Sept. 2015  . Stroke [facility]) Jul 07, 2013   mini  TIA  . Urgency of urination     Tobacco Use: Social History   Tobacco Use  Smoking Status Former Smoker  . Packs/day: 0.50  . Years: 50.00  . Pack years: 25.00  . Types: Cigarettes  . Last attempt to quit: 07/29/2011  . Years since quitting: 6.6  Smokeless Tobacco Never Used    Labs: Recent Review Flowsheet Data    Labs for ITP Cardiac and Pulmonary Rehab Latest Ref Rng & Units 01/04/2016 11/27/2016 05/03/2017 10/25/2017 11/04/2017   Cholestrol 0 - 200 mg/dL 95(L) 113 - 105 -   LDLCALC 0 - 99 mg/dL 39 39 - 50 -   HDL >40 mg/dL 39(L) 46 - 35(L) -   Trlycerides <150 mg/dL 86 140 - 101 -   Hemoglobin A1c - - - 5.7 - 5.9   TCO2 0 - 100 mmol/L - - - - -      Capillary Blood Glucose: Lab Results  Component Value Date   GLUCAP 97 08/29/2015   GLUCAP 111 (H) 07/07/2013   GLUCAP 112 (H) 12/22/2010     Pulmonary Assessment Scores: Pulmonary Assessment Scores    Row Name 11/28/17 0841 03/25/18 1543 03/27/18 0730     ADL UCSD   ADL Phase  Entry  Entry  Entry   SOB Score total  -  33  -     CAT Score   CAT Score  -  5  -     mMRC Score   mMRC Score  1  -  0      Pulmonary Function Assessment: Pulmonary Function Assessment - 11/18/17 1243      Pulmonary Function Tests   FEV1%  62 %    FEV1/FVC Ratio  64       Exercise Target Goals:    Exercise Program Goal: Individual exercise prescription set using results from initial 6 min walk test and  THRR while considering  patient's activity barriers and safety.   Exercise Prescription Goal: Initial exercise prescription builds to 30-45 minutes a day of aerobic activity, 2-3 days per week.  Home exercise  guidelines will be given to patient during program as part of exercise prescription that the participant will acknowledge.  Activity Barriers & Risk Stratification: Activity Barriers & Cardiac Risk Stratification - 03/21/18 1014      Activity Barriers & Cardiac Risk Stratification   Activity Barriers  Shortness of Breath;Balance Concerns;History of Falls       6 Minute Walk: 6 Minute Walk    Row Name 11/28/17 [phone] 03/27/18 0724       6 Minute Walk   Phase  Initial  Initial    Distance  1200 feet  1194 feet    Walk Time  6 minutes  6 minutes    # of Rest Breaks  0  0    MPH  2.27  2.26    METS  3.3  2.76    RPE  13  12    Perceived Dyspnea   3  2    Symptoms  Yes (comment)  Yes (comment)    Comments  8/10 calf pain  4/10 calf pain    Resting HR  59 bpm  57 bpm    Resting BP  124/58  126/69    Resting Oxygen Saturation   94 %  98 %    Exercise Oxygen Saturation  during 6 min walk  92 %  94 %    Max Ex. HR  117 bpm  74 bpm    Max Ex. BP  146/60  192/67    2 Minute Post BP  -  152/78      Interval HR   1 Minute HR  98  63    2 Minute HR  98  73    3 Minute HR  96  71    4 Minute HR  117  74    5 Minute HR  117  73    6 Minute HR  70  70    2 Minute Post HR  59  57    Interval Heart Rate?  Yes  Yes      Interval Oxygen   Interval Oxygen?  Yes  -    Baseline Oxygen Saturation %  94 %  98 %    1 Minute Oxygen Saturation %  94 %  96 %    1 Minute Liters of Oxygen  0 L  0 L    2 Minute Oxygen Saturation %  93 %  95 %    2 Minute Liters of Oxygen  0 L  0 L    3 Minute Oxygen Saturation %  92 %  96 %    3 Minute Liters of Oxygen  0 L  0 L    4 Minute Oxygen Saturation %  93 %  94 %    4 Minute Liters of Oxygen  0 L  0 L    5 Minute Oxygen Saturation %  92 %  95 %     5 Minute Liters of Oxygen  0 L  0 L    6 Minute Oxygen Saturation %  94 %  94 %    6 Minute Liters of Oxygen  0 L  0 L    2 Minute Post Oxygen Saturation %  97 %  98 %    2 Minute Post Liters of Oxygen  0 L  0 L       Oxygen Initial Assessment: Oxygen Initial Assessment - 03/27/18 0724      Initial 6 min  Walk   Oxygen Used  None      Program Oxygen Prescription   Program Oxygen Prescription  None       Oxygen Re-Evaluation: Oxygen Re-Evaluation    [place] Name 01/03/18 1025             Program Oxygen Prescription   Program Oxygen Prescription  None         Home Oxygen   Home Oxygen Device  None       Sleep Oxygen Prescription  CPAP       Home Exercise Oxygen Prescription  None       Home at Rest Exercise Oxygen Prescription  None       Compliance with Home Oxygen Use  Yes         Goals/Expected Outcomes   Goals/Expected Outcomes  Cont. to be compliant with CPAP          Oxygen Discharge (Final Oxygen Re-Evaluation): Oxygen Re-Evaluation - 01/03/18 1025      Program Oxygen Prescription   Program Oxygen Prescription  None      Home Oxygen   Home Oxygen Device  None    Sleep Oxygen Prescription  CPAP    Home Exercise Oxygen Prescription  None    Home at Rest Exercise Oxygen Prescription  None    Compliance with Home Oxygen Use  Yes      Goals/Expected Outcomes   Goals/Expected Outcomes  Cont. to be compliant with CPAP       Initial Exercise Prescription: Initial Exercise Prescription - 03/27/18 0700      Date of Initial Exercise RX and Referring Provider   Date  03/25/18    Referring Provider  Dr. Melvyn Novas      Bike   Level  0.6    Minutes  17      NuStep   Level  4    SPM  80    Minutes  17    METs  1.7      Track   Laps  10    Minutes  17      Prescription Details   Frequency (times per week)  2    Duration  Progress to 45 minutes of aerobic exercise without signs/symptoms of physical distress      Intensity   THRR 40-80% of Max  Heartrate  57-114    Ratings of Perceived Exertion  11-13    Perceived Dyspnea  0-4      Progression   Progression  Continue progressive overload as per policy without signs/symptoms or physical distress.      Resistance Training   Training Prescription  Yes    Weight  blue bands    Reps  10-15       Perform Capillary Blood Glucose checks as needed.  Exercise Prescription Changes: Exercise Prescription Changes    Row Name 12/03/17 1100 12/17/17 1300 12/19/17 1300 12/31/17 1200 01/14/18 1220     Response to Exercise   Blood Pressure (Admit)  146/80  -  134/70  150/66  134/60   Blood Pressure (Exercise)  158/60  -  132/58  118/60  148/56   Blood Pressure (Exit)  130/58  -  124/60  130/70  120/62   Heart Rate (Admit)  54 bpm  -  60 bpm  53 bpm  63 bpm   Heart Rate (Exercise)  64 bpm  -  54 bpm  60 bpm  62 bpm   Heart Rate (Exit)  48  bpm  -  59 bpm  49 bpm  57 bpm   Oxygen Saturation (Admit)  97 %  -  95 %  97 %  94 %   Oxygen Saturation (Exercise)  95 %  -  94 %  94 %  93 %   Oxygen Saturation (Exit)  98 %  -  94 %  97 %  97 %   Rating of Perceived Exertion (Exercise)  13  -  '13  13  13   '$ Perceived Dyspnea (Exercise)  2  -  '2  3  1   '$ Duration  Progress to 45 minutes of aerobic exercise without signs/symptoms of physical distress  -  Continue with 45 min of aerobic exercise without signs/symptoms of physical distress.  Continue with 45 min of aerobic exercise without signs/symptoms of physical distress.  Continue with 45 min of aerobic exercise without signs/symptoms of physical distress.   Intensity  Other (comment)  -  THRR unchanged  THRR unchanged  THRR unchanged     Progression   Progression  - 40-80% HRR  -  Continue to progress workloads to maintain intensity without signs/symptoms of physical distress.  Continue to progress workloads to maintain intensity without signs/symptoms of physical distress.  Continue to progress workloads to maintain intensity without signs/symptoms  of physical distress.     Resistance Training   Training Prescription  Yes  -  Yes  Yes  Yes   Weight  BLUE BANDS  -  Blue bands  Blue bands  Blue bands   Reps  10-15  -  10-15  10-15  10-15   Time  10 Minutes  -  10 Minutes  10 Minutes  10 Minutes     Bike   Level  0.6  -  '2  4  4   '$ Minutes  17  -  '17  17  17     '$ NuStep   Level  2  -  '4  4  5   '$ SPM  80  -  80  80  80   Minutes  17  -  '17  17  17   '$ METs  1.6  -  1.8  1.8  1.9     Track   Laps  10  -  '10  10  7   '$ Minutes  17  -  '17  17  17     '$ Home Exercise Plan   Plans to continue exercise at  -  Home (comment)  -  -  -   Frequency  -  Add 2 additional days to program exercise sessions.  -  -  -   Row Name 04/01/18 1200             Home Exercise Plan   Plans to continue exercise at  Home (comment)       Frequency  Add 2 additional days to program exercise sessions.          Exercise Comments: Exercise Comments    Row Name 12/17/17 1308 02/10/18 1425 04/01/18 1243       Exercise Comments  Home exercise completed  Patient is now d/c from program due to hospitalization  Home exercise completed        Exercise Goals and Review: Exercise Goals    Row Name 03/21/18 1017             Exercise Goals   Increase Physical Activity  Yes  Intervention  Provide advice, education, support and counseling about physical activity/exercise needs.;Develop an individualized exercise prescription for aerobic and resistive training based on initial evaluation findings, risk stratification, comorbidities and participant's personal goals.       Expected Outcomes  Short Term: Attend rehab on a regular basis to increase amount of physical activity.;Long Term: Add in home exercise to make exercise part of routine and to increase amount of physical activity.;Long Term: Exercising regularly at least 3-5 days a week.       Increase Strength and Stamina  Yes       Intervention  Provide advice, education, support and counseling about  physical activity/exercise needs.;Develop an individualized exercise prescription for aerobic and resistive training based on initial evaluation findings, risk stratification, comorbidities and participant's personal goals.       Expected Outcomes  Short Term: Increase workloads from initial exercise prescription for resistance, speed, and METs.;Short Term: Perform resistance training exercises routinely during rehab and add in resistance training at home;Long Term: Improve cardiorespiratory fitness, muscular endurance and strength as measured by increased METs and functional capacity (6MWT)       Able to understand and use rate of perceived exertion (RPE) scale  Yes       Intervention  Provide education and explanation on how to use RPE scale       Expected Outcomes  Short Term: Able to use RPE daily in rehab to express subjective intensity level;Long Term:  Able to use RPE to guide intensity level when exercising independently       Able to understand and use Dyspnea scale  Yes       Intervention  Provide education and explanation on how to use Dyspnea scale       Expected Outcomes  Short Term: Able to use Dyspnea scale daily in rehab to express subjective sense of shortness of breath during exertion;Long Term: Able to use Dyspnea scale to guide intensity level when exercising independently       Knowledge and understanding of Target Heart Rate Range (THRR)  Yes       Intervention  Provide education and explanation of THRR including how the numbers were predicted and where they are located for reference       Expected Outcomes  Short Term: Able to state/look up THRR;Long Term: Able to use THRR to govern intensity when exercising independently;Short Term: Able to use daily as guideline for intensity in rehab       Understanding of Exercise Prescription  Yes       Intervention  Provide education, explanation, and written materials on patient's individual exercise prescription       Expected Outcomes   Short Term: Able to explain program exercise prescription;Long Term: Able to explain home exercise prescription to exercise independently          Exercise Goals Re-Evaluation : Exercise Goals Re-Evaluation    Row Name 12/09/17 0736 01/03/18 1026 03/28/18 1054         Exercise Goal Re-Evaluation   Exercise Goals Review  Increase Strength and Stamina;Able to understand and use Dyspnea scale;Increase Physical Activity;Able to understand and use rate of perceived exertion (RPE) scale;Knowledge and understanding of Target Heart Rate Range (THRR);Understanding of Exercise Prescription  Increase Strength and Stamina;Able to understand and use Dyspnea scale;Increase Physical Activity;Able to understand and use rate of perceived exertion (RPE) scale;Knowledge and understanding of Target Heart Rate Range (THRR);Understanding of Exercise Prescription  Increase Strength and Stamina;Increase Physical Activity;Able to understand and use  rate of perceived exertion (RPE) scale;Knowledge and understanding of Target Heart Rate Range (THRR);Understanding of Exercise Prescription;Able to understand and use Dyspnea scale;Able to check pulse independently     Comments  Patient has only attended two rehab sessions. Will cont. to monitor and motivate as able.  PAD is a barrier for this patient. He is progressing wel and has started to exercise at home. Patient is able to walk up to 10 laps (200 ft) in 15 minutes. This includes breaks due to his leg pain. MET level still places him in a "low" functional level. Will cont to monitor patient and progress as able.   Patient will restart Pulmonary Rehab on 04/01/18. Will monitor and motivate as tolerated.      Expected Outcomes  Through exercise at rehab, patient will increase strength and stamina and gain enough confidence to start an exercise regime at home.   Through exercise at rehab, patient will increase strength and stamina and gain enough confidence to start an exercise  regime at home.   Through exercise at rehab and at home, the patient will decrease shortness of breath with daily activities and feel confident in carrying out an exercise regime at home.         Discharge Exercise Prescription (Final Exercise Prescription Changes): Exercise Prescription Changes - 04/01/18 1200      Home Exercise Plan   Plans to continue exercise at  Home (comment)    Frequency  Add 2 additional days to program exercise sessions.       Nutrition:  Target Goals: Understanding of nutrition guidelines, daily intake of sodium '1500mg'$ , cholesterol '200mg'$ , calories 30% from fat and 7% or less from saturated fats, daily to have 5 or more servings of fruits and vegetables.  Biometrics: Pre Biometrics - 11/18/17 1248      Pre Biometrics   Grip Strength  28 kg        Nutrition Therapy Plan and Nutrition Goals: Nutrition Therapy & Goals - 02/20/18 1227      Nutrition Therapy   Diet  Heart Healthy      Intervention Plan   Intervention  Prescribe, educate and counsel regarding individualized specific dietary modifications aiming towards targeted core components such as weight, hypertension, lipid management, diabetes, heart failure and other comorbidities.    Expected Outcomes  Short Term Goal: Understand basic principles of dietary content, such as calories, fat, sodium, cholesterol and nutrients.;Long Term Goal: Adherence to prescribed nutrition plan.       Nutrition Assessments: Nutrition Assessments - 02/20/18 1227      Rate Your Plate Scores   Pre Score  42       Nutrition Goals Re-Evaluation:   Nutrition Goals Discharge (Final Nutrition Goals Re-Evaluation):   Psychosocial: Target Goals: Acknowledge presence or absence of significant depression and/or stress, maximize coping skills, provide positive support system. Participant is able to verbalize types and ability to use techniques and skills needed for reducing stress and depression.  Initial Review &  Psychosocial Screening: Initial Psych Review & Screening - 03/21/18 1010      Initial Review   Current issues with  None Identified;History of Depression diminshed vision caused change in activity level and hobbies      [place]?  Yes      Barriers   Psychosocial barriers to participate in program  There are no identifiable barriers or psychosocial needs.      Screening Interventions   Interventions  Encouraged to exercise  Expected Outcomes  Long Term goal: The participant improves quality of Life and PHQ9 Scores as seen by post scores and/or verbalization of changes;Short Term goal: Identification and review with participant of any Quality of Life or Depression concerns found by scoring the questionnaire.       Quality of Life Scores:  Scores of 19 and below usually indicate a poorer quality of life in these areas.  A difference of  2-3 points is a clinically meaningful difference.  A difference of 2-3 points in the total score of the Quality of Life Index has been associated with significant improvement in overall quality of life, self-image, physical symptoms, and general health in studies assessing change in quality of life.   PHQ-9: Recent Review Flowsheet Data    Depression screen [facility] 2/9 03/21/2018 11/18/2017 11/04/2017 01/12/2015   Decreased Interest 0 0 0 0   Down, Depressed, Hopeless 0 0 0 0   PHQ - 2 Score 0 0 0 0   Altered sleeping 0 - - -   Tired, decreased energy 1 - - -   Change in appetite 0 - - -   Trouble concentrating 0 - - -   Moving slowly or fidgety/restless 0 - - -   Suicidal thoughts 0 - - -   PHQ-9 Score 1 - - -   Difficult doing work/chores Not difficult at all - - -     Interpretation of Total Score  Total Score Depression Severity:  1-4 = Minimal depression, 5-9 = Mild depression, 10-14 = Moderate depression, 15-19 = Moderately severe depression, 20-27 = Severe depression   Psychosocial Evaluation and  Intervention: Psychosocial Evaluation - 03/21/18 1012      Psychosocial Evaluation & Interventions   Interventions  Encouraged to exercise with the program and follow exercise prescription    Comments  no psychosocial issues identified    Expected Outcomes  no barriers to participation in pulmonary rehab    Continue Psychosocial Services   Follow up required by staff       Psychosocial Re-Evaluation: Psychosocial Re-Evaluation    Row Name 12/03/17 1238 01/06/18 1010 02/03/18 1029 04/01/18 1313       Psychosocial Re-Evaluation   Current issues with  None Identified  None Identified  None Identified  None Identified    Comments  -  -  None identified at last face to face encounter during exercise session.  none identified    Expected Outcomes  no barriers to participation in pulmonary rehab  no barriers to participation in pulmonary rehab  -  -    Interventions  Encouraged to attend Pulmonary Rehabilitation for the exercise  Encouraged to attend Pulmonary Rehabilitation for the exercise  -  Encouraged to attend Pulmonary Rehabilitation for the exercise    Continue Psychosocial Services   No Follow up required  Follow up required by staff  -  No Follow up required       Psychosocial Discharge (Final Psychosocial Re-Evaluation): Psychosocial Re-Evaluation - 04/01/18 1313      Psychosocial Re-Evaluation   Current issues with  None Identified    Comments  none identified    Interventions  Encouraged to attend Pulmonary Rehabilitation for the exercise    Continue Psychosocial Services   No Follow up required        Education: Education Goals: Education classes will be provided on a weekly basis, covering required topics. Participant will state understanding/return demonstration of topics presented.  Learning Barriers/Preferences: Learning Barriers/Preferences - 03/21/18 1012  Learning Barriers/Preferences   Learning Barriers  Sight;Hearing has aids for hearing but does not  use them    Learning Preferences  Individual Instruction;Skilled Demonstration;Verbal Instruction       Education Topics: How Lungs Work and Diseases: - Discuss the anatomy of the lungs and diseases that can affect the lungs, such as COPD.   Exercise: -Discuss the importance of exercise, FITT principles of exercise, normal and abnormal responses to exercise, and how to exercise safely.   Environmental Irritants: -Discuss types of environmental irritants and how to limit exposure to environmental irritants.   Meds/Inhalers and oxygen: - Discuss respiratory medications, definition of an inhaler and oxygen, and the proper way to use an inhaler and oxygen.   Energy Saving Techniques: - Discuss methods to conserve energy and decrease shortness of breath when performing activities of daily living.    Bronchial Hygiene / Breathing Techniques: - Discuss breathing mechanics, pursed-lip breathing technique,  proper posture, effective ways to clear airways, and other functional breathing techniques   Cleaning Equipment: - Provides group verbal and written instruction about the health risks of elevated stress, cause of high stress, and healthy ways to reduce stress.   Nutrition I: Fats: - Discuss the types of cholesterol, what cholesterol does to the body, and how cholesterol levels can be controlled.   Nutrition II: Labels: -Discuss the different components of food labels and how to read food labels.   Respiratory Infections: - Discuss the signs and symptoms of respiratory infections, ways to prevent respiratory infections, and the importance of seeking medical treatment when having a respiratory infection.   Stress I: Signs and Symptoms: - Discuss the causes of stress, how stress may lead to anxiety and depression, and ways to limit stress.   Stress II: Relaxation: -Discuss relaxation techniques to limit stress.   Oxygen for Home/Travel: - Discuss how to prepare for  travel when on oxygen and proper ways to transport and store oxygen to ensure safety.   Knowledge Questionnaire Score: Knowledge Questionnaire Score - 03/25/18 1543      Knowledge Questionnaire Score   Pre Score  16/18       Core Components/Risk Factors/Patient Goals at Admission: Personal Goals and Risk Factors at Admission - 03/21/18 1007      Core Components/Risk Factors/Patient Goals on Admission    Weight Management  Weight Loss;Yes    Intervention  Weight Management: Provide education and appropriate resources to help participant work on and attain dietary goals.;Weight Management: Develop a combined nutrition and exercise program designed to reach desired caloric intake, while maintaining appropriate intake of nutrient and fiber, sodium and fats, and appropriate energy expenditure required for the weight goal.;Weight Management/Obesity: Establish reasonable short term and long term weight goals.;Obesity: Provide education and appropriate resources to help participant work on and attain dietary goals.    Admit Weight  202 lb 6.1 oz (91.8 kg)    Goal Weight: Short Term  195 lb (88.5 kg)    Goal Weight: Long Term  190 lb (86.2 kg)    Expected Outcomes  Short Term: Continue to assess and modify interventions until short term weight is achieved;Long Term: Adherence to nutrition and physical activity/exercise program aimed toward attainment of established weight goal;Weight Loss: Understanding of general recommendations for a balanced deficit meal plan, which promotes 1-2 lb weight loss per week and includes a negative energy balance of [id number] kcal/d;Understanding recommendations for meals to include 15-35% energy as protein, 25-35% energy from fat, 35-60% energy from carbohydrates, less than  $'200mg'C$  of dietary cholesterol, 20-35 gm of total fiber daily;Understanding of distribution of calorie intake throughout the day with the consumption of 4-5 meals/snacks    Improve shortness of breath with  ADL's  Yes    Intervention  Provide education, individualized exercise plan and daily activity instruction to help decrease symptoms of SOB with activities of daily living.    Expected Outcomes  Short Term: Improve cardiorespiratory fitness to achieve a reduction of symptoms when performing ADLs;Long Term: Be able to perform more ADLs without symptoms or delay the onset of symptoms    Heart Failure  Yes    Intervention  Provide a combined exercise and nutrition program that is supplemented with education, support and counseling about heart failure. Directed toward relieving symptoms such as shortness of breath, decreased exercise tolerance, and extremity edema.    Expected Outcomes  Improve functional capacity of life;Short term: Attendance in program 2-3 days a week with increased exercise capacity. Reported lower sodium intake. Reported increased fruit and vegetable intake. Reports medication compliance.;Long term: Adoption of self-care skills and reduction of barriers for early signs and symptoms recognition and intervention leading to self-care maintenance.    Hypertension  Yes    Intervention  Provide education on lifestyle modifcations including regular physical activity/exercise, weight management, moderate sodium restriction and increased consumption of fresh fruit, vegetables, and low fat dairy, alcohol moderation, and smoking cessation.;Monitor prescription use compliance.    Expected Outcomes  Short Term: Continued assessment and intervention until BP is < 140/70m HG in hypertensive participants. < 130/863mHG in hypertensive participants with diabetes, heart failure or chronic kidney disease.;Long Term: Maintenance of blood pressure at goal levels.    Lipids  Yes    Intervention  Provide education and support for participant on nutrition & aerobic/resistive exercise along with prescribed medications to achieve LDL '70mg'$ , HDL >'40mg'$ .    Expected Outcomes  Short Term: Participant states  understanding of desired cholesterol values and is compliant with medications prescribed. Participant is following exercise prescription and nutrition guidelines.;Long Term: Cholesterol controlled with medications as prescribed, with individualized exercise RX and with personalized nutrition plan. Value goals: LDL < '70mg'$ , HDL > 40 mg.       Core Components/Risk Factors/Patient Goals Review:  Goals and Risk Factor Review    Row Name 12/03/17 1235 01/06/18 1009 02/03/18 1026 04/01/18 1311       Core Components/Risk Factors/Patient Goals Review   Personal Goals Review  Improve shortness of breath with ADL's  Improve shortness of breath with ADL's  Improve shortness of breath with ADL's  Weight Management/Obesity;Improve shortness of breath with ADL's;Heart Failure;Hypertension;Lipids    Review  Has just begun program, has exercised once in program, tolerated it well.  Too early to see progression toward goals  patient is doing well in the pulmonary rehab program. he states he can see a significant improvement in his shortness of breath at home with ADLs and with exercise. he has even started exercising outside of rehab focusing not only on aerobic exercise but stregnth training as well.  patient was doing well in pulmonary rehab until last week when he was admitted to the hospital for multiple falls, dehydration, and URI. He was previously seen by MD for URI and prescribed prednisone with antibiotics. His health continued to decline and he became dehydrated which resulted in several falls with bruising and facial scrapes. He was discharged home yesterday from hospital with HHPT and 24 hour care for safety. He will be discharged from pulmonary rehab  and readmitted at a later time once he has recovered from current illness and deconditioning.   First day of exercise today, did well, too early to see progression toward outcomes/goals    Expected Outcomes  see admission goals  see admission goals  -  see  admission goals       Core Components/Risk Factors/Patient Goals at Discharge (Final Review):  Goals and Risk Factor Review - 04/01/18 1311      Core Components/Risk Factors/Patient Goals Review   Personal Goals Review  Weight Management/Obesity;Improve shortness of breath with ADL's;Heart Failure;Hypertension;Lipids    Review  First day of exercise today, did well, too early to see progression toward outcomes/goals    Expected Outcomes  see admission goals       ITP Comments: ITP Comments    Row Name 03/21/18 0956           ITP Comments  Dr. Jennet Maduro, Medical Director          Comments: ITP REVIEW Pt is making expected progress toward pulmonary rehab goals after completing 1 sessions. Recommend continued exercise, life style modification, education, and utilization of breathing techniques to increase stamina and strength and decrease shortness of breath with exertion.

## 2018-04-01 NOTE — Progress Notes (Signed)
Daily Session Note  Patient Details  Name: Troy Lipinski [name] MRN: [id number] Date of Birth: 02-06-1941 Referring Provider:     Pulmonary Rehab Walk Test from 03/25/2018 in [place]  Referring Provider  Dr. Melvyn Novas      Encounter Date: 04/01/2018  Check In: Session Check In - 04/01/18 1030      Check-In   Location  [facility]    Staff Present  Rosebud Poles, RN, BSN;Carlette Carlton, RN, [facility], MS, ACSM RCEP, Exercise Physiologist;Lisa Ysidro Evert, Felipe Drone, RN, [facility]    Supervising physician immediately available to respond to emergencies  [facility] immediately available    Physician(s)  Dr. Bonner Puna    Medication changes reported      No    Fall or balance concerns reported     No    Tobacco Cessation  No Change    Warm-up and Cool-down  Performed as group-led instruction    Resistance Training Performed  Yes    VAD Patient?  No    PAD/SET Patient?  No      Pain Assessment   Currently in Pain?  No/denies    Multiple Pain Sites  No       Capillary Blood Glucose: No results found for this or any previous visit (from the past 24 hour(s)).  Exercise Prescription Changes - 04/01/18 1200      Home Exercise Plan   Plans to continue exercise at  Home (comment)    Frequency  Add 2 additional days to program exercise sessions.       Social History   Tobacco Use  Smoking Status Former Smoker  . Packs/day: 0.50  . Years: 50.00  . Pack years: 25.00  . Types: Cigarettes  . Last attempt to quit: 07/29/2011  . Years since quitting: 6.6  Smokeless Tobacco Never Used    Goals Met:  Exercise tolerated well Strength training completed today  Goals Unmet:  Not Applicable  Comments: Service time is from 1030 to 1215    Dr. Rush Farmer is Medical Director for Pulmonary Rehab at [facility].

## 2018-04-08 ENCOUNTER — Encounter (HOSPITAL_COMMUNITY)
Admission: RE | Admit: 2018-04-08 | Discharge: 2018-04-08 | Disposition: A | Discharge status: Home / Self Care | Payer: Commercial Managed Care - PPO | Source: Ambulatory Visit | Attending: Internal Medicine | Admitting: Internal Medicine

## 2018-04-08 VITALS — Wt 202.2 lb

## 2018-04-08 DIAGNOSIS — J449 Chronic obstructive pulmonary disease, unspecified: Secondary | ICD-10-CM

## 2018-04-08 NOTE — Progress Notes (Signed)
Troy Thorpe [name] 77 y.o. male   DOB: Jun 07, 1941 MRN: [id number]          Nutrition 1. Chronic obstructive pulmonary disease, unspecified COPD type ([place])    Past Medical History:  Diagnosis Date  . AAA (abdominal aortic aneurysm) (HCC) LAST ABD. [name]  JAN 2014  3.4CM   MONITORED BY DR [name]  . Arthritis   . Atrial fibrillation ([place])   . Benign essential HTN 01/19/2016  . Bilateral carotid artery stenosis DUPLEX 12-29-2012  BY DR [facility]   BILATERAL ICA STENOSIS 60-79%  . BPH (benign prostatic hypertrophy)   . Cancer ([place]) 01-25-12   LUNG  . Complication of anesthesia    had nausea after carotid artery surgery, states the medicine given to help the nausea, made it worse  . COPD (chronic obstructive pulmonary disease) ([place])   . Exertional dyspnea   . Frequency of urination   . GERD (gastroesophageal reflux disease)   . Glaucoma BOTH EYES   Dr Gershon Crane  . Glaucoma   . History of basal cell carcinoma excision   . History of lung cancer APRIL 2012  SQUAMOUS CELL---- S/P RIGHT LOWER LOBECTOMY AT [facility] --  NO CHEMORADIATION---  NO RECURRENCE    ONCOLOGIST- DR Tressie Stalker  LOV IN [facility] 10-27-2012  . Hx of adenomatous colonic polyps 2005    X 2; 1 hyperplastic polyp; Dr Olevia Perches  . Hyperlipidemia   . Hypertension   . Impaired fasting glucose 2007   108; A1c5.4%  . Lesion of bladder   . Microhematuria   . Nocturia   . OSA (obstructive sleep apnea) 08/29/2015   Mild with AHI overall 6/hr but severe during REM sleep with AHI 30/hr with oxygen saturations < 88% for 5.5 minutes of total sleep time.    [name] PAD (peripheral artery disease) ([place]) ABI'S  JAN 2014  0.65 ON RIGHT ;  1.04 ON LEFT  . Peripheral vascular disease ([place]) S/P ANGIOPLASTY AND STENTING   FOLLOWED  BY DR [name]  . Spinal stenosis Sept. 2015  . Stroke [facility]) Jul 07, 2013   mini  TIA  . Urgency of urination    Meds reviewed. Lasix, crestor  noted  Ht: Ht Readings from Last 1 Encounters:  03/24/18 6\' 1"  (1.854 m)     Wt:  Wt  Readings from Last 3 Encounters:  04/01/18 201 lb 11.5 oz (91.5 kg)  03/24/18 203 lb 12.8 oz (92.4 kg)  03/21/18 202 lb 6.1 oz (91.8 kg)     BMI: 26.6 kg/m2    Current tobacco use? no  Labs:  Lipid Panel     Component Value Date/Time   CHOL 105 10/25/2017 1211   TRIG 101 10/25/2017 1211   TRIG 97 10/11/2006 1129   HDL 35 (L) 10/25/2017 1211   CHOLHDL 3.0 10/25/2017 1211   VLDL 20 10/25/2017 1211   LDLCALC 50 10/25/2017 1211    Lab Results  Component Value Date   HGBA1C 5.9 11/04/2017   Note Spoke with pt. Pt is overweight. Pt eats 3 meals a day; most meals prepared at home by his wife.  Making healthy food choices the majority of the time.  Pt's Rate Your Plate results reviewed with pt. Pt avoids salty food, does not use canned/ convenience food. Pt adds salt to food, does this ~1x a week with corn on the cob. Pt shared he tends to consume seafood and poultry the majority of the time. Has reduced consumption of red me to 1x a week. The  role of sodium in lung disease reviewed with pt. Pt expressed understanding of the information reviewed. Pt is prediabetic per last A1c, discussed using complex carbohydrates in place of simple sugars and refined grains.  Nutrition Diagnosis  ? Overweight/obesity related to excessive energy intake as evidenced by a BMI of 26.6   Nutrition Intervention ? Pt's individual nutrition plan and goals reviewed with pt. ? Benefits of adopting healthy eating habits discussed when pt's Rate Your Plate reviewed.  Goal(s) 1. Pt to identify and limit food sources of sodium. 2. Identify food quantities necessary to achieve wt loss of  -2# per week to a goal wt loss of 6-24 lb at graduation from pulmonary rehab. 3. Describe the benefit of including fruits, vegetables, whole grains, and low-fat dairy products in a healthy meal plan. 4. Pt will identify and limit sources of refined grains and simple sugars  Plan:  Pt to attend Pulmonary Nutrition  class Will provide client-centered nutrition education as part of interdisciplinary care.   Monitor and evaluate progress toward nutrition goal with team.  Monitor and Evaluate progress toward nutrition goal with team.   Laurina Bustle, MS, RD, LDN 04/08/2018 11:37 AM

## 2018-04-08 NOTE — Progress Notes (Signed)
Daily Session Note  Patient Details  Name: Troy Nazari [name] MRN: [id number] Date of Birth: [birth year] Referring Provider:     Pulmonary Rehab Walk Test from 03/25/2018 in [place]  Referring Provider  Dr. Melvyn Novas      Encounter Date: 04/08/2018  Check In: Session Check In - 04/08/18 1030      Check-In   Location  [facility]    Staff Present  Rosebud Poles, RN, BSN;Carlette Wilber Oliphant, RN, BSN;Ramon Dredge, RN, MHA;Lisa Ysidro Evert, RN    Supervising physician immediately available to respond to emergencies  [facility] immediately available    Physician(s)  Dr. Verlon Au    Medication changes reported      No    Fall or balance concerns reported     No    Tobacco Cessation  No Change    Warm-up and Cool-down  Performed as group-led instruction    Resistance Training Performed  Yes    VAD Patient?  No    PAD/SET Patient?  No      Pain Assessment   Currently in Pain?  No/denies    Multiple Pain Sites  No       Capillary Blood Glucose: No results found for this or any previous visit (from the past 24 hour(s)).  Exercise Prescription Changes - 04/08/18 1400      Response to Exercise   Blood Pressure (Admit)  124/52    Blood Pressure (Exercise)  128/60    Blood Pressure (Exit)  120/68    Heart Rate (Admit)  49 bpm    Heart Rate (Exercise)  56 bpm    Heart Rate (Exit)  51 bpm    Oxygen Saturation (Admit)  98 %    Oxygen Saturation (Exercise)  95 %    Oxygen Saturation (Exit)  95 %    Rating of Perceived Exertion (Exercise)  13    Perceived Dyspnea (Exercise)  1    Duration  Progress to 45 minutes of aerobic exercise without signs/symptoms of physical distress    Intensity  THRR unchanged      Progression   Progression  Continue to progress workloads to maintain intensity without signs/symptoms of physical distress.      Resistance Training   Training Prescription  Yes    Weight  blue bands    Reps  10-15    Time  10  Minutes      Interval Training   Interval Training  No      Bike   Level  4    Minutes  17      NuStep   Level  4    SPM  80    Minutes  17    METs  1.6      Track   Laps  7    Minutes  17       Social History   Tobacco Use  Smoking Status Former Smoker  . Packs/day: 0.50  . Years: 50.00  . Pack years: 25.00  . Types: Cigarettes  . Last attempt to quit: 07/29/2011  . Years since quitting: 6.6  Smokeless Tobacco Never Used    Goals Met:  Exercise tolerated well Strength training completed today  Goals Unmet:  Not Applicable  Comments: Service time is from 1030 to 1210    Dr. Rush Farmer is Medical Director for Pulmonary Rehab at [facility]

## 2018-04-10 ENCOUNTER — Encounter (HOSPITAL_COMMUNITY)
Admission: RE | Admit: 2018-04-10 | Discharge: 2018-04-10 | Disposition: A | Discharge status: Home / Self Care | Payer: Commercial Managed Care - PPO | Source: Ambulatory Visit | Attending: Internal Medicine | Admitting: Internal Medicine

## 2018-04-10 VITALS — Wt 202.8 lb

## 2018-04-10 DIAGNOSIS — J449 Chronic obstructive pulmonary disease, unspecified: Secondary | ICD-10-CM | POA: Diagnosis not present

## 2018-04-10 NOTE — Progress Notes (Signed)
Daily Session Note  Patient Details  Name: Ralf Konopka [name] MRN: [id number] Date of Birth: 1941/03/07 Referring Provider:     Pulmonary Rehab Walk Test from 03/25/2018 in [name]  Referring Provider  Dr. Melvyn Novas      Encounter Date: 04/10/2018  Check In: Session Check In - 04/10/18 1030      Check-In   Location  [facility]    Staff Present  Rosebud Poles, RN, BSN;Carlette Wilber Oliphant, RN, BSN;Lisa Ysidro Evert, Felipe Drone, RN, [facility]    Supervising physician immediately available to respond to emergencies  [facility] immediately available    Physician(s)  Dr. Broadus Aidin    Medication changes reported      No    Fall or balance concerns reported     No    Tobacco Cessation  No Change    Warm-up and Cool-down  Performed as group-led instruction    Resistance Training Performed  Yes    VAD Patient?  No    PAD/SET Patient?  No      Pain Assessment   Currently in Pain?  No/denies    Multiple Pain Sites  No       Capillary Blood Glucose: No results found for this or any previous visit (from the past 24 hour(s)).    Social History   Tobacco Use  Smoking Status Former Smoker  . Packs/day: 0.50  . Years: 50.00  . Pack years: 25.00  . Types: Cigarettes  . Last attempt to quit: 07/29/2011  . Years since quitting: 6.7  Smokeless Tobacco Never Used    Goals Met:  Exercise tolerated well Strength training completed today  Goals Unmet:  Not Applicable  Comments: Service time is from 1030 to 1225    Dr. Rush Farmer is Medical Director for Pulmonary Rehab at [facility].

## 2018-04-13 ENCOUNTER — Other Ambulatory Visit (HOSPITAL_COMMUNITY): Payer: Self-pay | Admitting: Cardiology

## 2018-04-14 ENCOUNTER — Ambulatory Visit (INDEPENDENT_AMBULATORY_CARE_PROVIDER_SITE_OTHER): Payer: Commercial Managed Care - PPO | Admitting: *Deleted

## 2018-04-14 DIAGNOSIS — I480 Paroxysmal atrial fibrillation: Secondary | ICD-10-CM

## 2018-04-15 ENCOUNTER — Telehealth: Payer: Self-pay

## 2018-04-15 ENCOUNTER — Encounter (HOSPITAL_COMMUNITY)
Admission: RE | Admit: 2018-04-15 | Discharge: 2018-04-15 | Disposition: A | Discharge status: Home / Self Care | Payer: Commercial Managed Care - PPO | Source: Ambulatory Visit | Attending: Internal Medicine | Admitting: Internal Medicine

## 2018-04-15 VITALS — Wt 201.1 lb

## 2018-04-15 DIAGNOSIS — J449 Chronic obstructive pulmonary disease, unspecified: Secondary | ICD-10-CM | POA: Diagnosis not present

## 2018-04-15 NOTE — Telephone Encounter (Signed)
Patient wife called to see if pt was in A-fib. I let her talk to device tech nurse.

## 2018-04-15 NOTE — Progress Notes (Signed)
Daily Session Note  Patient Details  Name: Octave Montrose [name] MRN: [id number] Date of Birth: October 13, 1940 Referring Provider:     Pulmonary Rehab Walk Test from 03/25/2018 in [place]  Referring Provider  Dr. Melvyn Novas      Encounter Date: 04/15/2018  Check In: Session Check In - 04/15/18 1030      Check-In   Location  [facility]    Staff Present  Rosebud Poles, RN, BSN;Carlette Carlton, RN, [facility], MS, ACSM RCEP, Exercise Physiologist;Lisa Ysidro Evert, Felipe Drone, RN, [facility]    Supervising physician immediately available to respond to emergencies  [facility] immediately available    Physician(s)  Dr. Broadus Masato    Medication changes reported      No    Fall or balance concerns reported     No    Tobacco Cessation  No Change    Warm-up and Cool-down  Performed as group-led instruction    Resistance Training Performed  Yes    VAD Patient?  No    PAD/SET Patient?  No      Pain Assessment   Currently in Pain?  No/denies    Multiple Pain Sites  No       Capillary Blood Glucose: No results found for this or any previous visit (from the past 24 hour(s)).    Social History   Tobacco Use  Smoking Status Former Smoker  . Packs/day: 0.50  . Years: 50.00  . Pack years: 25.00  . Types: Cigarettes  . Last attempt to quit: 07/29/2011  . Years since quitting: 6.7  Smokeless Tobacco Never Used    Goals Met:  Exercise tolerated well Strength training completed today  Goals Unmet:  Not Applicable  Comments: Service time is from 1030 to 1205    Dr. Rush Farmer is Medical Director for Pulmonary Rehab at [facility].

## 2018-04-15 NOTE — Progress Notes (Signed)
Carelink Summary Report / Loop Recorder 

## 2018-04-17 ENCOUNTER — Encounter (HOSPITAL_COMMUNITY): Payer: Commercial Managed Care - PPO

## 2018-04-17 LAB — CUP PACEART REMOTE DEVICE CHECK
Date Time Interrogation Session: 20190612014027
MDC IDC PG IMPLANT DT: 20161111

## 2018-04-18 NOTE — Telephone Encounter (Signed)
Follow Up    Pt c/o Shortness Of Breath: STAT if SOB developed within the last 24 hours or pt is noticeably SOB on the phone  1. Are you currently SOB (can you hear that pt is SOB on the phone)? Wife calling  2. How long have you been experiencing SOB? unsure  3. Are you SOB when sitting or when up moving around? Moving around  4. Are you currently experiencing any other symptoms?  Thinks he is in afib

## 2018-04-18 NOTE — Telephone Encounter (Signed)
Spoke with pt informed her of the episodes of the AF pt has had since 7/17 which is the last data send attempted to have pt send in a manual transmission x 2 but received error codes number to [place] [facility] given for pt to call.

## 2018-04-18 NOTE — Telephone Encounter (Signed)
Called patient's wife back. She was checking about the monitoring device her husband has. Patient has had several A. FIB episodes this patient week. Patient is feeling better right now. Will send to [facility] to follow-up.

## 2018-04-20 ENCOUNTER — Other Ambulatory Visit (HOSPITAL_COMMUNITY): Payer: Self-pay | Admitting: Cardiology

## 2018-04-20 DIAGNOSIS — I509 Heart failure, unspecified: Secondary | ICD-10-CM

## 2018-04-20 DIAGNOSIS — R0602 Shortness of breath: Secondary | ICD-10-CM

## 2018-04-21 NOTE — Progress Notes (Signed)
Pulmonary Individual Treatment Plan  Patient Details  Name: Kymani Shimabukuro [name] MRN: [id number] Date of Birth: Nov 22, 1940 Referring Provider:     Pulmonary Rehab Walk Test from 03/25/2018 in [place]  Referring Provider  Dr. Melvyn Novas      Initial Encounter Date:    Pulmonary Rehab Walk Test from 03/25/2018 in [place]  Date  03/25/18      Visit Diagnosis: Chronic obstructive pulmonary disease, unspecified COPD type ([place])  Patient's Home Medications on Admission:   Current Outpatient Medications:  .  acetaminophen (TYLENOL) 325 MG tablet, Take 650 mg every 6 (six) hours as needed by mouth for moderate pain or headache., Disp: , Rfl:  .  amiodarone (PACERONE) 200 MG tablet, TAKE 1/2 TABLET EVERY DAY., Disp: 15 tablet, Rfl: 6 .  bimatoprost (LUMIGAN) 0.01 % SOLN, Place 1 drop at bedtime into both eyes. , Disp: , Rfl:  .  Biotin 10000 MCG TABS, Take 10,000 mcg daily by mouth. , Disp: , Rfl:  .  budesonide-formoterol (SYMBICORT) 160-4.5 MCG/ACT inhaler, Inhale 2 puffs into the lungs 2 (two) times daily. (Patient not taking: Reported on 03/24/2018), Disp: 1 Inhaler, Rfl: 12 .  CARDURA 4 MG tablet, TAKE (1/2) TABLET DAILY., Disp: 15 tablet, Rfl: 5 .  dorzolamide-timolol (COSOPT) 22.3-6.8 MG/ML ophthalmic solution, Place 2 drops 2 (two) times daily into the right eye. , Disp: , Rfl:  .  ELIQUIS 5 MG TABS tablet, TAKE 1 TABLET BY MOUTH TWICE DAILY., Disp: 60 tablet, Rfl: 2 .  FLUoxetine (PROZAC) 20 MG capsule, TAKE (1) CAPSULE DAILY., Disp: 30 capsule, Rfl: 5 .  fluticasone (FLONASE) 50 MCG/ACT nasal spray, Place 2 sprays into both nostrils daily., Disp: , Rfl: 0 .  furosemide (LASIX) 20 MG tablet, Take 1 tablet (20 mg total) by mouth daily., Disp: 30 tablet, Rfl: 11 .  Hypertonic Nasal Wash (SINUS RINSE KIT NA), Place 1 application into the nose daily as needed (congestion)., Disp: , Rfl:  .  iron polysaccharides (POLY-IRON 150) 150 MG  capsule, Take 150 mg by mouth daily., Disp: , Rfl:  .  Melatonin 5 MG TABS, Take 5 mg by mouth at bedtime., Disp: , Rfl:  .  montelukast (SINGULAIR) 10 MG tablet, Take 1 tablet (10 mg total) by mouth at bedtime. (Patient not taking: Reported on 03/24/2018), Disp: 30 tablet, Rfl: 5 .  Multiple Vitamin (MULTIVITAMIN WITH MINERALS) TABS tablet, Take 1 tablet by mouth daily. Centrum Silver, Disp: , Rfl:  .  Polyethyl Glycol-Propyl Glycol (SYSTANE OP), Place 1-2 drops as needed into both eyes (for dry eyes)., Disp: , Rfl:  .  potassium chloride SA (K-DUR,KLOR-CON) 20 MEQ tablet, TAKE 1 TABLET ONCE DAILY., Disp: 30 tablet, Rfl: 3 .  ranitidine (ZANTAC) 150 MG tablet, Take 150 mg by mouth daily as needed for heartburn. , Disp: , Rfl:  .  rosuvastatin (CRESTOR) 40 MG tablet, TAKE 1 TABLET ONCE DAILY., Disp: 30 tablet, Rfl: 2  Past Medical History: Past Medical History:  Diagnosis Date  . AAA (abdominal aortic aneurysm) (HCC) LAST ABD. [name]  JAN 2014  3.4CM   MONITORED BY DR [name]  . Arthritis   . Atrial fibrillation ([place])   . Benign essential HTN 01/19/2016  . Bilateral carotid artery stenosis DUPLEX 12-29-2012  BY DR [facility]   BILATERAL ICA STENOSIS 60-79%  . BPH (benign prostatic hypertrophy)   . Cancer ([place]) 01-25-12   LUNG  . Complication of anesthesia    had nausea  after carotid artery surgery, states the medicine given to help the nausea, made it worse  . COPD (chronic obstructive pulmonary disease) ([place])   . Exertional dyspnea   . Frequency of urination   . GERD (gastroesophageal reflux disease)   . Glaucoma BOTH EYES   Dr Gershon Crane  . Glaucoma   . History of basal cell carcinoma excision   . History of lung cancer APRIL 2012  SQUAMOUS CELL---- S/P RIGHT LOWER LOBECTOMY AT [facility] --  NO CHEMORADIATION---  NO RECURRENCE    ONCOLOGIST- DR Tressie Stalker  LOV IN [facility] 10-27-2012  . Hx of adenomatous colonic polyps 2005    X 2; 1 hyperplastic polyp; Dr Olevia Perches  . Hyperlipidemia   . Hypertension   .  Impaired fasting glucose 2007   108; A1c5.4%  . Lesion of bladder   . Microhematuria   . Nocturia   . OSA (obstructive sleep apnea) 08/29/2015   Mild with AHI overall 6/hr but severe during REM sleep with AHI 30/hr with oxygen saturations < 88% for 5.5 minutes of total sleep time.    [name] PAD (peripheral artery disease) ([place]) ABI'S  JAN 2014  0.65 ON RIGHT ;  1.04 ON LEFT  . Peripheral vascular disease ([place]) S/P ANGIOPLASTY AND STENTING   FOLLOWED  BY DR [name]  . Spinal stenosis Sept. 2015  . Stroke [facility]) Jul 07, 2013   mini  TIA  . Urgency of urination     Tobacco Use: Social History   Tobacco Use  Smoking Status Former Smoker  . Packs/day: 0.50  . Years: 50.00  . Pack years: 25.00  . Types: Cigarettes  . Last attempt to quit: 07/29/2011  . Years since quitting: 6.7  Smokeless Tobacco Never Used    Labs: Recent Review Flowsheet Data    Labs for ITP Cardiac and Pulmonary Rehab Latest Ref Rng & Units 01/04/2016 11/27/2016 05/03/2017 10/25/2017 11/04/2017   Cholestrol 0 - 200 mg/dL 95(L) 113 - 105 -   LDLCALC 0 - 99 mg/dL 39 39 - 50 -   HDL >40 mg/dL 39(L) 46 - 35(L) -   Trlycerides <150 mg/dL 86 140 - 101 -   Hemoglobin A1c - - - 5.7 - 5.9   TCO2 0 - 100 mmol/L - - - - -      Capillary Blood Glucose: Lab Results  Component Value Date   GLUCAP 97 08/29/2015   GLUCAP 111 (H) 07/07/2013   GLUCAP 112 (H) 12/22/2010     Pulmonary Assessment Scores: Pulmonary Assessment Scores    Row Name 11/28/17 0841 03/25/18 1543 03/27/18 0730     ADL UCSD   ADL Phase  Entry  Entry  Entry   SOB Score total  -  33  -     CAT Score   CAT Score  -  5  -     mMRC Score   mMRC Score  1  -  0      Pulmonary Function Assessment: Pulmonary Function Assessment - 11/18/17 1243      Pulmonary Function Tests   FEV1%  62 %    FEV1/FVC Ratio  64       Exercise Target Goals:    Exercise Program Goal: Individual exercise prescription set using results from initial 6 min walk test and  THRR while considering  patient's activity barriers and safety.    Exercise Prescription Goal: Initial exercise prescription builds to 30-45 minutes a day of aerobic activity, 2-3 days per week.  Home  exercise guidelines will be given to patient during program as part of exercise prescription that the participant will acknowledge.  Activity Barriers & Risk Stratification: Activity Barriers & Cardiac Risk Stratification - 03/21/18 1014      Activity Barriers & Cardiac Risk Stratification   Activity Barriers  Shortness of Breath;Balance Concerns;History of Falls       6 Minute Walk: 6 Minute Walk    Row Name 11/28/17 [phone] 03/27/18 0724       6 Minute Walk   Phase  Initial  Initial    Distance  1200 feet  1194 feet    Walk Time  6 minutes  6 minutes    # of Rest Breaks  0  0    MPH  2.27  2.26    METS  3.3  2.76    RPE  13  12    Perceived Dyspnea   3  2    Symptoms  Yes (comment)  Yes (comment)    Comments  8/10 calf pain  4/10 calf pain    Resting HR  59 bpm  57 bpm    Resting BP  124/58  126/69    Resting Oxygen Saturation   94 %  98 %    Exercise Oxygen Saturation  during 6 min walk  92 %  94 %    Max Ex. HR  117 bpm  74 bpm    Max Ex. BP  146/60  192/67    2 Minute Post BP  -  152/78      Interval HR   1 Minute HR  98  63    2 Minute HR  98  73    3 Minute HR  96  71    4 Minute HR  117  74    5 Minute HR  117  73    6 Minute HR  70  70    2 Minute Post HR  59  57    Interval Heart Rate?  Yes  Yes      Interval Oxygen   Interval Oxygen?  Yes  -    Baseline Oxygen Saturation %  94 %  98 %    1 Minute Oxygen Saturation %  94 %  96 %    1 Minute Liters of Oxygen  0 L  0 L    2 Minute Oxygen Saturation %  93 %  95 %    2 Minute Liters of Oxygen  0 L  0 L    3 Minute Oxygen Saturation %  92 %  96 %    3 Minute Liters of Oxygen  0 L  0 L    4 Minute Oxygen Saturation %  93 %  94 %    4 Minute Liters of Oxygen  0 L  0 L    5 Minute Oxygen Saturation %  92 %  95 %     5 Minute Liters of Oxygen  0 L  0 L    6 Minute Oxygen Saturation %  94 %  94 %    6 Minute Liters of Oxygen  0 L  0 L    2 Minute Post Oxygen Saturation %  97 %  98 %    2 Minute Post Liters of Oxygen  0 L  0 L       Oxygen Initial Assessment: Oxygen Initial Assessment - 03/27/18 0724      Initial 6  min Walk   Oxygen Used  None      Program Oxygen Prescription   Program Oxygen Prescription  None       Oxygen Re-Evaluation: Oxygen Re-Evaluation    [place] Name 01/03/18 1025 04/21/18 0943           Program Oxygen Prescription   Program Oxygen Prescription  None  None        Home Oxygen   Home Oxygen Device  None  None      Sleep Oxygen Prescription  CPAP  CPAP      Liters per minute  -  0      Home Exercise Oxygen Prescription  None  None      Home at Rest Exercise Oxygen Prescription  None  None      Compliance with Home Oxygen Use  Yes  Yes        Goals/Expected Outcomes   Short Term Goals  -  To learn and understand importance of maintaining oxygen saturations>88%;To learn and demonstrate proper use of respiratory medications;To learn and understand importance of monitoring SPO2 with pulse oximeter and demonstrate accurate use of the pulse oximeter.;To learn and demonstrate proper pursed lip breathing techniques or other breathing techniques.      Long  Term Goals  -  Verbalizes importance of monitoring SPO2 with pulse oximeter and return demonstration;Maintenance of O2 saturations>88%;Exhibits proper breathing techniques, such as pursed lip breathing or other method taught during program session;Compliance with respiratory medication;Demonstrates proper use of MDI's      Goals/Expected Outcomes  Cont. to be compliant with CPAP  Cont. to be compliant with CPAP         Oxygen Discharge (Final Oxygen Re-Evaluation): Oxygen Re-Evaluation - 04/21/18 0943      Program Oxygen Prescription   Program Oxygen Prescription  None      Home Oxygen   Home Oxygen Device  None     Sleep Oxygen Prescription  CPAP    Liters per minute  0    Home Exercise Oxygen Prescription  None    Home at Rest Exercise Oxygen Prescription  None    Compliance with Home Oxygen Use  Yes      Goals/Expected Outcomes   Short Term Goals  To learn and understand importance of maintaining oxygen saturations>88%;To learn and demonstrate proper use of respiratory medications;To learn and understand importance of monitoring SPO2 with pulse oximeter and demonstrate accurate use of the pulse oximeter.;To learn and demonstrate proper pursed lip breathing techniques or other breathing techniques.    Long  Term Goals  Verbalizes importance of monitoring SPO2 with pulse oximeter and return demonstration;Maintenance of O2 saturations>88%;Exhibits proper breathing techniques, such as pursed lip breathing or other method taught during program session;Compliance with respiratory medication;Demonstrates proper use of MDI's    Goals/Expected Outcomes  Cont. to be compliant with CPAP       Initial Exercise Prescription: Initial Exercise Prescription - 03/27/18 0700      Date of Initial Exercise RX and Referring Provider   Date  03/25/18    Referring Provider  Dr. Melvyn Novas      Bike   Level  0.6    Minutes  17      NuStep   Level  4    SPM  80    Minutes  17    METs  1.7      Track   Laps  10    Minutes  17  Prescription Details   Frequency (times per week)  2    Duration  Progress to 45 minutes of aerobic exercise without signs/symptoms of physical distress      Intensity   THRR 40-80% of Max Heartrate  57-114    Ratings of Perceived Exertion  11-13    Perceived Dyspnea  0-4      Progression   Progression  Continue progressive overload as per policy without signs/symptoms or physical distress.      Resistance Training   Training Prescription  Yes    Weight  blue bands    Reps  10-15       Perform Capillary Blood Glucose checks as needed.  Exercise Prescription  Changes:  Exercise Prescription Changes    Row Name 12/03/17 1100 12/17/17 1300 12/19/17 1300 12/31/17 1200 01/14/18 1220     Response to Exercise   Blood Pressure (Admit)  146/80  -  134/70  150/66  134/60   Blood Pressure (Exercise)  158/60  -  132/58  118/60  148/56   Blood Pressure (Exit)  130/58  -  124/60  130/70  120/62   Heart Rate (Admit)  54 bpm  -  60 bpm  53 bpm  63 bpm   Heart Rate (Exercise)  64 bpm  -  54 bpm  60 bpm  62 bpm   Heart Rate (Exit)  48 bpm  -  59 bpm  49 bpm  57 bpm   Oxygen Saturation (Admit)  97 %  -  95 %  97 %  94 %   Oxygen Saturation (Exercise)  95 %  -  94 %  94 %  93 %   Oxygen Saturation (Exit)  98 %  -  94 %  97 %  97 %   Rating of Perceived Exertion (Exercise)  13  -  '13  13  13   '$ Perceived Dyspnea (Exercise)  2  -  '2  3  1   '$ Duration  Progress to 45 minutes of aerobic exercise without signs/symptoms of physical distress  -  Continue with 45 min of aerobic exercise without signs/symptoms of physical distress.  Continue with 45 min of aerobic exercise without signs/symptoms of physical distress.  Continue with 45 min of aerobic exercise without signs/symptoms of physical distress.   Intensity  Other (comment)  -  THRR unchanged  THRR unchanged  THRR unchanged     Progression   Progression  - 40-80% HRR  -  Continue to progress workloads to maintain intensity without signs/symptoms of physical distress.  Continue to progress workloads to maintain intensity without signs/symptoms of physical distress.  Continue to progress workloads to maintain intensity without signs/symptoms of physical distress.     Resistance Training   Training Prescription  Yes  -  Yes  Yes  Yes   Weight  BLUE BANDS  -  Blue bands  Blue bands  Blue bands   Reps  10-15  -  10-15  10-15  10-15   Time  10 Minutes  -  10 Minutes  10 Minutes  10 Minutes     Bike   Level  0.6  -  '2  4  4   '$ Minutes  17  -  '17  17  17     '$ NuStep   Level  2  -  '4  4  5   '$ SPM  80  -  80  80  80    Minutes  17  -  '17  17  17   '$ METs  1.6  -  1.8  1.8  1.9     Track   Laps  10  -  '10  10  7   '$ Minutes  17  -  '17  17  17     '$ Home Exercise Plan   Plans to continue exercise at  -  Home (comment)  -  -  -   Frequency  -  Add 2 additional days to program exercise sessions.  -  -  -   Row Name 04/01/18 1200 04/08/18 1400 04/22/18 1200         Response to Exercise   Blood Pressure (Admit)  -  124/52  122/66     Blood Pressure (Exercise)  -  128/60  140/60     Blood Pressure (Exit)  -  120/68  110/60     Heart Rate (Admit)  -  49 bpm  81 bpm     Heart Rate (Exercise)  -  56 bpm  88 bpm     Heart Rate (Exit)  -  51 bpm  81 bpm     Oxygen Saturation (Admit)  -  98 %  97 %     Oxygen Saturation (Exercise)  -  95 %  99 %     Oxygen Saturation (Exit)  -  95 %  99 %     Rating of Perceived Exertion (Exercise)  -  13  11     Perceived Dyspnea (Exercise)  -  1  1     Duration  -  Progress to 45 minutes of aerobic exercise without signs/symptoms of physical distress  Progress to 45 minutes of aerobic exercise without signs/symptoms of physical distress     Intensity  -  THRR unchanged  THRR unchanged       Progression   Progression  -  Continue to progress workloads to maintain intensity without signs/symptoms of physical distress.  Continue to progress workloads to maintain intensity without signs/symptoms of physical distress.       Resistance Training   Training Prescription  -  Yes  Yes     Weight  -  blue bands  blue bands     Reps  -  10-15  10-15     Time  -  10 Minutes  10 Minutes       Interval Training   Interval Training  -  No  No       Bike   Level  -  4  4     Minutes  -  17  17       NuStep   Level  -  4  5     SPM  -  80  80     Minutes  -  17  17     METs  -  1.6  1.9       Track   Laps  -  7  6     Minutes  -  17  17       Home Exercise Plan   Plans to continue exercise at  Home (comment)  -  -     Frequency  Add 2 additional days to program exercise  sessions.  -  -        Exercise Comments:  Exercise Comments    Row Name 12/17/17 1308 02/10/18 1425 04/01/18 1243  Exercise Comments  Home exercise completed  Patient is now d/c from program due to hospitalization  Home exercise completed        Exercise Goals and Review:  Exercise Goals    Row Name 03/21/18 1017             Exercise Goals   Increase Physical Activity  Yes       Intervention  Provide advice, education, support and counseling about physical activity/exercise needs.;Develop an individualized exercise prescription for aerobic and resistive training based on initial evaluation findings, risk stratification, comorbidities and participant's personal goals.       Expected Outcomes  Short Term: Attend rehab on a regular basis to increase amount of physical activity.;Long Term: Add in home exercise to make exercise part of routine and to increase amount of physical activity.;Long Term: Exercising regularly at least 3-5 days a week.       Increase Strength and Stamina  Yes       Intervention  Provide advice, education, support and counseling about physical activity/exercise needs.;Develop an individualized exercise prescription for aerobic and resistive training based on initial evaluation findings, risk stratification, comorbidities and participant's personal goals.       Expected Outcomes  Short Term: Increase workloads from initial exercise prescription for resistance, speed, and METs.;Short Term: Perform resistance training exercises routinely during rehab and add in resistance training at home;Long Term: Improve cardiorespiratory fitness, muscular endurance and strength as measured by increased METs and functional capacity (6MWT)       Able to understand and use rate of perceived exertion (RPE) scale  Yes       Intervention  Provide education and explanation on how to use RPE scale       Expected Outcomes  Short Term: Able to use RPE daily in rehab to express  subjective intensity level;Long Term:  Able to use RPE to guide intensity level when exercising independently       Able to understand and use Dyspnea scale  Yes       Intervention  Provide education and explanation on how to use Dyspnea scale       Expected Outcomes  Short Term: Able to use Dyspnea scale daily in rehab to express subjective sense of shortness of breath during exertion;Long Term: Able to use Dyspnea scale to guide intensity level when exercising independently       Knowledge and understanding of Target Heart Rate Range (THRR)  Yes       Intervention  Provide education and explanation of THRR including how the numbers were predicted and where they are located for reference       Expected Outcomes  Short Term: Able to state/look up THRR;Long Term: Able to use THRR to govern intensity when exercising independently;Short Term: Able to use daily as guideline for intensity in rehab       Understanding of Exercise Prescription  Yes       Intervention  Provide education, explanation, and written materials on patient's individual exercise prescription       Expected Outcomes  Short Term: Able to explain program exercise prescription;Long Term: Able to explain home exercise prescription to exercise independently          Exercise Goals Re-Evaluation : Exercise Goals Re-Evaluation    Row Name 12/09/17 0736 01/03/18 1026 03/28/18 1054 04/21/18 0956       Exercise Goal Re-Evaluation   Exercise Goals Review  Increase Strength and Stamina;Able to understand and use Dyspnea scale;Increase Physical  Activity;Able to understand and use rate of perceived exertion (RPE) scale;Knowledge and understanding of Target Heart Rate Range (THRR);Understanding of Exercise Prescription  Increase Strength and Stamina;Able to understand and use Dyspnea scale;Increase Physical Activity;Able to understand and use rate of perceived exertion (RPE) scale;Knowledge and understanding of Target Heart Rate Range  (THRR);Understanding of Exercise Prescription  Increase Strength and Stamina;Increase Physical Activity;Able to understand and use rate of perceived exertion (RPE) scale;Knowledge and understanding of Target Heart Rate Range (THRR);Understanding of Exercise Prescription;Able to understand and use Dyspnea scale;Able to check pulse independently  Increase Strength and Stamina;Increase Physical Activity;Able to understand and use rate of perceived exertion (RPE) scale;Knowledge and understanding of Target Heart Rate Range (THRR);Understanding of Exercise Prescription;Able to understand and use Dyspnea scale    Comments  Patient has only attended two rehab sessions. Will cont. to monitor and motivate as able.  PAD is a barrier for this patient. He is progressing wel and has started to exercise at home. Patient is able to walk up to 10 laps (200 ft) in 15 minutes. This includes breaks due to his leg pain. MET level still places him in a "low" functional level. Will cont to monitor patient and progress as able.   Patient will restart Pulmonary Rehab on 04/01/18. Will monitor and motivate as tolerated.   Patient has only completed 4 rehab sessions. Will cont. to monitor and progress as able.    Expected Outcomes  Through exercise at rehab, patient will increase strength and stamina and gain enough confidence to start an exercise regime at home.   Through exercise at rehab, patient will increase strength and stamina and gain enough confidence to start an exercise regime at home.   Through exercise at rehab and at home, the patient will decrease shortness of breath with daily activities and feel confident in carrying out an exercise regime at home.   Through exercise at rehab and at home, the patient will decrease shortness of breath with daily activities and feel confident in carrying out an exercise regime at home.        Discharge Exercise Prescription (Final Exercise Prescription Changes): Exercise Prescription  Changes - 04/22/18 1200      Response to Exercise   Blood Pressure (Admit)  122/66    Blood Pressure (Exercise)  140/60    Blood Pressure (Exit)  110/60    Heart Rate (Admit)  81 bpm    Heart Rate (Exercise)  88 bpm    Heart Rate (Exit)  81 bpm    Oxygen Saturation (Admit)  97 %    Oxygen Saturation (Exercise)  99 %    Oxygen Saturation (Exit)  99 %    Rating of Perceived Exertion (Exercise)  11    Perceived Dyspnea (Exercise)  1    Duration  Progress to 45 minutes of aerobic exercise without signs/symptoms of physical distress    Intensity  THRR unchanged      Progression   Progression  Continue to progress workloads to maintain intensity without signs/symptoms of physical distress.      Resistance Training   Training Prescription  Yes    Weight  blue bands    Reps  10-15    Time  10 Minutes      Interval Training   Interval Training  No      Bike   Level  4    Minutes  17      NuStep   Level  5    SPM  80    Minutes  17    METs  1.9      Track   Laps  6    Minutes  17       Nutrition:  Target Goals: Understanding of nutrition guidelines, daily intake of sodium '1500mg'$ , cholesterol '200mg'$ , calories 30% from fat and 7% or less from saturated fats, daily to have 5 or more servings of fruits and vegetables.  Biometrics: Pre Biometrics - 11/18/17 1248      Pre Biometrics   Grip Strength  28 kg        Nutrition Therapy Plan and Nutrition Goals: Nutrition Therapy & Goals - 04/08/18 1147      Nutrition Therapy   Diet  Heart Healthy      Personal Nutrition Goals   Nutrition Goal  Pt to identify and limit food sources of sodium.    Personal Goal #2  Identify food quantities necessary to achieve wt loss of  -2# per week to a goal wt loss of 6-24 lb at graduation from pulmonary rehab.      Intervention Plan   Intervention  Prescribe, educate and counsel regarding individualized specific dietary modifications aiming towards targeted core components such as  weight, hypertension, lipid management, diabetes, heart failure and other comorbidities.    Expected Outcomes  Short Term Goal: Understand basic principles of dietary content, such as calories, fat, sodium, cholesterol and nutrients.;Long Term Goal: Adherence to prescribed nutrition plan.       Nutrition Assessments: Nutrition Assessments - 02/20/18 1227      Rate Your Plate Scores   Pre Score  42       Nutrition Goals Re-Evaluation: Nutrition Goals Re-Evaluation    [place] Name 04/08/18 1148             Goals   Nutrition Goal  Pt to identify and limit food sources of sodium.       Comment  Identify food quantities necessary to achieve wt loss of  -2# per week to a goal wt loss of 6-24 lb at graduation from pulmonary rehab.          Nutrition Goals Discharge (Final Nutrition Goals Re-Evaluation): Nutrition Goals Re-Evaluation - 04/08/18 1148      Goals   Nutrition Goal  Pt to identify and limit food sources of sodium.    Comment  Identify food quantities necessary to achieve wt loss of  -2# per week to a goal wt loss of 6-24 lb at graduation from pulmonary rehab.       Psychosocial: Target Goals: Acknowledge presence or absence of significant depression and/or stress, maximize coping skills, provide positive support system. Participant is able to verbalize types and ability to use techniques and skills needed for reducing stress and depression.  Initial Review & Psychosocial Screening: Initial Psych Review & Screening - 03/21/18 1010      Initial Review   Current issues with  None Identified;History of Depression diminshed vision caused change in activity level and hobbies      [name]?  Yes      Barriers   Psychosocial barriers to participate in program  There are no identifiable barriers or psychosocial needs.      Screening Interventions   Interventions  Encouraged to exercise    Expected Outcomes  Long Term goal: The participant  improves quality of Life and PHQ9 Scores as seen by post scores and/or verbalization of changes;Short Term goal: Identification and review with  participant of any Quality of Life or Depression concerns found by scoring the questionnaire.       Quality of Life Scores:  Scores of 19 and below usually indicate a poorer quality of life in these areas.  A difference of  2-3 points is a clinically meaningful difference.  A difference of 2-3 points in the total score of the Quality of Life Index has been associated with significant improvement in overall quality of life, self-image, physical symptoms, and general health in studies assessing change in quality of life.   PHQ-9: Recent Review Flowsheet Data    Depression screen [facility] 2/9 03/21/2018 11/18/2017 11/04/2017 01/12/2015   Decreased Interest 0 0 0 0   Down, Depressed, Hopeless 0 0 0 0   PHQ - 2 Score 0 0 0 0   Altered sleeping 0 - - -   Tired, decreased energy 1 - - -   Change in appetite 0 - - -   Trouble concentrating 0 - - -   Moving slowly or fidgety/restless 0 - - -   Suicidal thoughts 0 - - -   PHQ-9 Score 1 - - -   Difficult doing work/chores Not difficult at all - - -     Interpretation of Total Score  Total Score Depression Severity:  1-4 = Minimal depression, 5-9 = Mild depression, 10-14 = Moderate depression, 15-19 = Moderately severe depression, 20-27 = Severe depression   Psychosocial Evaluation and Intervention: Psychosocial Evaluation - 04/21/18 1635      Psychosocial Evaluation & Interventions   Expected Outcomes  no barriers to participation in pulmonary rehab    Continue Psychosocial Services   Follow up required by staff       Psychosocial Re-Evaluation: Psychosocial Re-Evaluation    Row Name 12/03/17 1238 01/06/18 1010 02/03/18 1029 04/01/18 1313 04/21/18 1635     Psychosocial Re-Evaluation   Current issues with  None Identified  None Identified  None Identified  None Identified  None Identified   Comments  -   -  None identified at last face to face encounter during exercise session.  none identified  none identified   Expected Outcomes  no barriers to participation in pulmonary rehab  no barriers to participation in pulmonary rehab  -  -  no barriers to participation in pulmonary rehab   Interventions  Encouraged to attend Pulmonary Rehabilitation for the exercise  Encouraged to attend Pulmonary Rehabilitation for the exercise  -  Encouraged to attend Pulmonary Rehabilitation for the exercise  Relaxation education   Continue Psychosocial Services   No Follow up required  Follow up required by staff  -  No Follow up required  Follow up required by staff      Psychosocial Discharge (Final Psychosocial Re-Evaluation): Psychosocial Re-Evaluation - 04/21/18 1635      Psychosocial Re-Evaluation   Current issues with  None Identified    Comments  none identified    Expected Outcomes  no barriers to participation in pulmonary rehab    Interventions  Relaxation education    Continue Psychosocial Services   Follow up required by staff       Education: Education Goals: Education classes will be provided on a weekly basis, covering required topics. Participant will state understanding/return demonstration of topics presented.  Learning Barriers/Preferences: Learning Barriers/Preferences - 03/21/18 1012      Learning Barriers/Preferences   Learning Barriers  Sight;Hearing has aids for hearing but does not use them    Learning Preferences  Individual  Instruction;Skilled Demonstration;Verbal Instruction       Education Topics: Risk Factor Reduction:  -Group instruction that is supported by a PowerPoint presentation. Instructor discusses the definition of a risk factor, different risk factors for pulmonary disease, and how the heart and lungs work together.     Nutrition for Pulmonary Patient:  -Group instruction provided by PowerPoint slides, verbal discussion, and written materials to support  subject matter. The instructor gives an explanation and review of healthy diet recommendations, which includes a discussion on weight management, recommendations for fruit and vegetable consumption, as well as protein, fluid, caffeine, fiber, sodium, sugar, and alcohol. Tips for eating when patients are short of breath are discussed.   PULMONARY REHAB CHRONIC OBSTRUCTIVE PULMONARY DISEASE from 01/09/2018 in [place]  Date  12/19/17  Educator  Parke Simmers  Instruction Review Code  2- Demonstrated Understanding      Pursed Lip Breathing:  -Group instruction that is supported by demonstration and informational handouts. Instructor discusses the benefits of pursed lip and diaphragmatic breathing and detailed demonstration on how to preform both.     Oxygen Safety:  -Group instruction provided by PowerPoint, verbal discussion, and written material to support subject matter. There is an overview of "What is Oxygen" and "Why do we need it".  Instructor also reviews how to create a safe environment for oxygen use, the importance of using oxygen as prescribed, and the risks of noncompliance. There is a brief discussion on traveling with oxygen and resources the patient may utilize.   Oxygen Equipment:  -Group instruction provided by [facility] Staff utilizing handouts, written materials, and equipment demonstrations.   Signs and Symptoms:  -Group instruction provided by written material and verbal discussion to support subject matter. Warning signs and symptoms of infection, stroke, and heart attack are reviewed and when to call the physician/911 reinforced. Tips for preventing the spread of infection discussed.   PULMONARY REHAB CHRONIC OBSTRUCTIVE PULMONARY DISEASE from 04/10/2018 in [place]  Date  04/10/18  Educator  Remo Lipps  Instruction Review Code  1- Verbalizes Understanding      Advanced Directives:  -Group instruction provided by  verbal instruction and written material to support subject matter. Instructor reviews Advanced Directive laws and proper instruction for filling out document.   Pulmonary Video:  -Group video education that reviews the importance of medication and oxygen compliance, exercise, good nutrition, pulmonary hygiene, and pursed lip and diaphragmatic breathing for the pulmonary patient.   Exercise for the Pulmonary Patient:  -Group instruction that is supported by a PowerPoint presentation. Instructor discusses benefits of exercise, core components of exercise, frequency, duration, and intensity of an exercise routine, importance of utilizing pulse oximetry during exercise, safety while exercising, and options of places to exercise outside of rehab.     Pulmonary Medications:  -Verbally interactive group education provided by instructor with focus on inhaled medications and proper administration.   PULMONARY REHAB CHRONIC OBSTRUCTIVE PULMONARY DISEASE from 01/09/2018 in [place]  Date  12/26/17  Educator  pharm  Instruction Review Code  2- Demonstrated Understanding      Anatomy and Physiology of the Respiratory System and Intimacy:  -Group instruction provided by PowerPoint, verbal discussion, and written material to support subject matter. Instructor reviews respiratory cycle and anatomical components of the respiratory system and their functions. Instructor also reviews differences in obstructive and restrictive respiratory diseases with examples of each. Intimacy, Sex, and Sexuality differences are reviewed with a  discussion on how relationships can change when diagnosed with pulmonary disease. Common sexual concerns are reviewed.   MD DAY -A group question and answer session with a medical doctor that allows participants to ask questions that relate to their pulmonary disease state.   PULMONARY REHAB CHRONIC OBSTRUCTIVE PULMONARY DISEASE from 01/09/2018 in [place]  Date  01/09/18  Educator  Dr. Nelda Marseille  Instruction Review Code  1- Verbalizes Understanding      OTHER EDUCATION -Group or individual verbal, written, or video instructions that support the educational goals of the pulmonary rehab program.   PULMONARY REHAB CHRONIC OBSTRUCTIVE PULMONARY DISEASE from 01/09/2018 in [place]  Date  01/02/18 Rodney Booze a sedentary lifestyle]  Educator  EP  Instruction Review Code  1- Verbalizes Understanding      Holiday Eating Survival Tips:  -Group instruction provided by PowerPoint slides, verbal discussion, and written materials to support subject matter. The instructor gives patients tips, tricks, and techniques to help them not only survive but enjoy the holidays despite the onslaught of food that accompanies the holidays.   Knowledge Questionnaire Score: Knowledge Questionnaire Score - 03/25/18 1543      Knowledge Questionnaire Score   Pre Score  16/18       Core Components/Risk Factors/Patient Goals at Admission: Personal Goals and Risk Factors at Admission - 03/21/18 1007      Core Components/Risk Factors/Patient Goals on Admission    Weight Management  Weight Loss;Yes    Intervention  Weight Management: Provide education and appropriate resources to help participant work on and attain dietary goals.;Weight Management: Develop a combined nutrition and exercise program designed to reach desired caloric intake, while maintaining appropriate intake of nutrient and fiber, sodium and fats, and appropriate energy expenditure required for the weight goal.;Weight Management/Obesity: Establish reasonable short term and long term weight goals.;Obesity: Provide education and appropriate resources to help participant work on and attain dietary goals.    Admit Weight  202 lb 6.1 oz (91.8 kg)    Goal Weight: Short Term  195 lb (88.5 kg)    Goal Weight: Long Term  190 lb (86.2 kg)    Expected  Outcomes  Short Term: Continue to assess and modify interventions until short term weight is achieved;Long Term: Adherence to nutrition and physical activity/exercise program aimed toward attainment of established weight goal;Weight Loss: Understanding of general recommendations for a balanced deficit meal plan, which promotes 1-2 lb weight loss per week and includes a negative energy balance of [phone] kcal/d;Understanding recommendations for meals to include 15-35% energy as protein, 25-35% energy from fat, 35-60% energy from carbohydrates, less than '200mg'$  of dietary cholesterol, 20-35 gm of total fiber daily;Understanding of distribution of calorie intake throughout the day with the consumption of 4-5 meals/snacks    Improve shortness of breath with ADL's  Yes    Intervention  Provide education, individualized exercise plan and daily activity instruction to help decrease symptoms of SOB with activities of daily living.    Expected Outcomes  Short Term: Improve cardiorespiratory fitness to achieve a reduction of symptoms when performing ADLs;Long Term: Be able to perform more ADLs without symptoms or delay the onset of symptoms    Heart Failure  Yes    Intervention  Provide a combined exercise and nutrition program that is supplemented with education, support and counseling about heart failure. Directed toward relieving symptoms such as shortness of breath, decreased exercise tolerance, and extremity edema.    Expected  Outcomes  Improve functional capacity of life;Short term: Attendance in program 2-3 days a week with increased exercise capacity. Reported lower sodium intake. Reported increased fruit and vegetable intake. Reports medication compliance.;Long term: Adoption of self-care skills and reduction of barriers for early signs and symptoms recognition and intervention leading to self-care maintenance.    Hypertension  Yes    Intervention  Provide education on lifestyle modifcations including regular  physical activity/exercise, weight management, moderate sodium restriction and increased consumption of fresh fruit, vegetables, and low fat dairy, alcohol moderation, and smoking cessation.;Monitor prescription use compliance.    Expected Outcomes  Short Term: Continued assessment and intervention until BP is < 140/28m HG in hypertensive participants. < 130/859mHG in hypertensive participants with diabetes, heart failure or chronic kidney disease.;Long Term: Maintenance of blood pressure at goal levels.    Lipids  Yes    Intervention  Provide education and support for participant on nutrition & aerobic/resistive exercise along with prescribed medications to achieve LDL '70mg'$ , HDL >'40mg'$ .    Expected Outcomes  Short Term: Participant states understanding of desired cholesterol values and is compliant with medications prescribed. Participant is following exercise prescription and nutrition guidelines.;Long Term: Cholesterol controlled with medications as prescribed, with individualized exercise RX and with personalized nutrition plan. Value goals: LDL < '70mg'$ , HDL > 40 mg.       Core Components/Risk Factors/Patient Goals Review:  Goals and Risk Factor Review    Row Name 12/03/17 1235 01/06/18 1009 02/03/18 1026 04/01/18 1311 04/21/18 1631     Core Components/Risk Factors/Patient Goals Review   Personal Goals Review  Improve shortness of breath with ADL's  Improve shortness of breath with ADL's  Improve shortness of breath with ADL's  Weight Management/Obesity;Improve shortness of breath with ADL's;Heart Failure;Hypertension;Lipids  Weight Management/Obesity;Improve shortness of breath with ADL's;Heart Failure   Review  Has just begun program, has exercised once in program, tolerated it well.  Too early to see progression toward goals  patient is doing well in the pulmonary rehab program. he states he can see a significant improvement in his shortness of breath at home with ADLs and with exercise. he has  even started exercising outside of rehab focusing not only on aerobic exercise but stregnth training as well.  patient was doing well in pulmonary rehab until last week when he was admitted to the hospital for multiple falls, dehydration, and URI. He was previously seen by MD for URI and prescribed prednisone with antibiotics. His health continued to decline and he became dehydrated which resulted in several falls with bruising and facial scrapes. He was discharged home yesterday from hospital with HHPT and 24 hour care for safety. He will be discharged from pulmonary rehab and readmitted at a later time once he has recovered from current illness and deconditioning.   First day of exercise today, did well, too early to see progression toward outcomes/goals  Pt has completed 4 exercise sessions.  Pt with fair attendance and pt returns after discharge from pulmonary rehab earlier this year.  Pt with slight weight loss of .3kg since starting on 7/2. Pt reprorts his shortness of breath has not improved.  Hopefully with consistent attendance during the next 30 days, will be able to see some progress.  Pt with bp and lipids within normal limits will resolve these two patietnt goalls.   Expected Outcomes  see admission goals  see admission goals  -  see admission goals  see admission goals   Row Name 04/23/18 1153  Core Components/Risk Factors/Patient Goals Review   Review  Pt has completed 4 exercise sessions.  Pt with fair attendance and pt returns after discharge from pulmonary rehab earlier this year.  Pt with slight weight loss of .3kg since starting on 7/2. Pt reprorts his shortness of breath has not improved.  Hopefully with consistent attendance during the next 30 days, will be able to see some progress. Presently pt is on level 4 for nustep and sci fit bike, averages 9-11 laps on the track.  Pt with bp and lipids within normal limits will resolve these two patietnt goalls.       Expected  Outcomes  see admission goals/outcomes          Core Components/Risk Factors/Patient Goals at Discharge (Final Review):  Goals and Risk Factor Review - 04/23/18 1153      Core Components/Risk Factors/Patient Goals Review   Review  Pt has completed 4 exercise sessions.  Pt with fair attendance and pt returns after discharge from pulmonary rehab earlier this year.  Pt with slight weight loss of .3kg since starting on 7/2. Pt reprorts his shortness of breath has not improved.  Hopefully with consistent attendance during the next 30 days, will be able to see some progress. Presently pt is on level 4 for nustep and sci fit bike, averages 9-11 laps on the track.  Pt with bp and lipids within normal limits will resolve these two patietnt goalls.    Expected Outcomes  see admission goals/outcomes       ITP Comments: ITP Comments    Row Name 03/21/18 0956 04/21/18 1627         ITP Comments  Dr. Jennet Maduro, Medical Director  Dr. Jennet Maduro, Medical Director         Comments:  Pt has completed 4 exercise sessions. Cherre Huger, BSN Cardiac and Training and development officer

## 2018-04-22 ENCOUNTER — Encounter (HOSPITAL_COMMUNITY)
Admission: RE | Admit: 2018-04-22 | Discharge: 2018-04-22 | Disposition: A | Discharge status: Home / Self Care | Payer: Commercial Managed Care - PPO | Source: Ambulatory Visit | Attending: Internal Medicine | Admitting: Internal Medicine

## 2018-04-22 VITALS — Wt 201.9 lb

## 2018-04-22 DIAGNOSIS — J449 Chronic obstructive pulmonary disease, unspecified: Secondary | ICD-10-CM

## 2018-04-22 NOTE — Progress Notes (Signed)
Daily Session Note  Patient Details  Name: Troy Tootle [name] MRN: [id number] Date of Birth: [birth year] Referring Provider:     Pulmonary Rehab Walk Test from 03/25/2018 in [place]  Referring Provider  Dr. Melvyn Novas      Encounter Date: 04/22/2018  Check In: Session Check In - 04/22/18 1030      Check-In   Location  [facility]    Staff Present  Rosebud Poles, RN, BSN;Carlette Carlton, RN, [facility], MS, ACSM RCEP, Exercise Physiologist;Lisa Ysidro Evert, Felipe Drone, RN, [facility]    Supervising physician immediately available to respond to emergencies  [facility] immediately available    Physician(s)  Dr. Denton Brick    Medication changes reported      No    Fall or balance concerns reported     No    Tobacco Cessation  No Change    Warm-up and Cool-down  Performed as group-led instruction    Resistance Training Performed  Yes    VAD Patient?  No    PAD/SET Patient?  No      Pain Assessment   Currently in Pain?  No/denies    Multiple Pain Sites  No       Capillary Blood Glucose: No results found for this or any previous visit (from the past 24 hour(s)).  Exercise Prescription Changes - 04/22/18 1200      Response to Exercise   Blood Pressure (Admit)  122/66    Blood Pressure (Exercise)  140/60    Blood Pressure (Exit)  110/60    Heart Rate (Admit)  81 bpm    Heart Rate (Exercise)  88 bpm    Heart Rate (Exit)  81 bpm    Oxygen Saturation (Admit)  97 %    Oxygen Saturation (Exercise)  99 %    Oxygen Saturation (Exit)  99 %    Rating of Perceived Exertion (Exercise)  11    Perceived Dyspnea (Exercise)  1    Duration  Progress to 45 minutes of aerobic exercise without signs/symptoms of physical distress    Intensity  THRR unchanged      Progression   Progression  Continue to progress workloads to maintain intensity without signs/symptoms of physical distress.      Resistance Training   Training Prescription   Yes    Weight  blue bands    Reps  10-15    Time  10 Minutes      Interval Training   Interval Training  No      Bike   Level  4    Minutes  17      NuStep   Level  5    SPM  80    Minutes  17    METs  1.9      Track   Laps  6    Minutes  17       Social History   Tobacco Use  Smoking Status Former Smoker  . Packs/day: 0.50  . Years: 50.00  . Pack years: 25.00  . Types: Cigarettes  . Last attempt to quit: 07/29/2011  . Years since quitting: 6.7  Smokeless Tobacco Never Used    Goals Met:  Exercise tolerated well Strength training completed today  Goals Unmet:  Not Applicable  Comments: Service time is from 1030 to 1215    Dr. Rush Farmer is Medical Director for Pulmonary Rehab at [facility].

## 2018-04-24 ENCOUNTER — Encounter (HOSPITAL_COMMUNITY)
Admission: RE | Admit: 2018-04-24 | Discharge: 2018-04-24 | Disposition: A | Discharge status: Home / Self Care | Payer: Commercial Managed Care - PPO | Source: Ambulatory Visit | Attending: Internal Medicine | Admitting: Internal Medicine

## 2018-04-24 DIAGNOSIS — J449 Chronic obstructive pulmonary disease, unspecified: Secondary | ICD-10-CM | POA: Diagnosis not present

## 2018-04-24 NOTE — Progress Notes (Signed)
Daily Session Note  Patient Details  Name: Troy Radloff [name] MRN: [id number] Date of Birth: 01-05-1941 Referring Provider:     Pulmonary Rehab Walk Test from 03/25/2018 in [place]  Referring Provider  Dr. Melvyn Novas      Encounter Date: 04/24/2018  Check In: Session Check In - 04/24/18 1112      Check-In   Supervising physician immediately available to respond to emergencies  [facility] immediately available    Physician(s)  Dr. Tawanna Solo    Location  [facility]    Staff Present  Su Hilt, MS, ACSM RCEP, Exercise Physiologist;Lisa Ysidro Evert, RN;Carlette Carlton, RN, BSN    Medication changes reported      No    Fall or balance concerns reported     No    Tobacco Cessation  No Change    Warm-up and Cool-down  Performed as group-led instruction    Resistance Training Performed  Yes    VAD Patient?  No    PAD/SET Patient?  No      Pain Assessment   Currently in Pain?  No/denies    Multiple Pain Sites  No       Capillary Blood Glucose: No results found for this or any previous visit (from the past 24 hour(s)).    Social History   Tobacco Use  Smoking Status Former Smoker  . Packs/day: 0.50  . Years: 50.00  . Pack years: 25.00  . Types: Cigarettes  . Last attempt to quit: 07/29/2011  . Years since quitting: 6.7  Smokeless Tobacco Never Used    Goals Met:  Exercise tolerated well No report of cardiac concerns or symptoms Strength training completed today  Goals Unmet:  Not Applicable  Comments: Service time is from 10:30a to 12:30p    Dr. Rush Farmer is Medical Director for Pulmonary Rehab at [facility]

## 2018-04-29 ENCOUNTER — Encounter (HOSPITAL_COMMUNITY)
Admission: RE | Admit: 2018-04-29 | Discharge: 2018-04-29 | Disposition: A | Discharge status: Home / Self Care | Payer: Commercial Managed Care - PPO | Source: Ambulatory Visit | Attending: Internal Medicine | Admitting: Internal Medicine

## 2018-04-29 VITALS — Wt 201.3 lb

## 2018-04-29 DIAGNOSIS — J449 Chronic obstructive pulmonary disease, unspecified: Secondary | ICD-10-CM

## 2018-04-29 NOTE — Progress Notes (Signed)
Daily Session Note  Patient Details  Name: Troy Makar [name] MRN: [id number] Date of Birth: 1940/12/01 Referring Provider:     Pulmonary Rehab Walk Test from 03/25/2018 in [place]  Referring Provider  Dr. Melvyn Novas      Encounter Date: 04/29/2018  Check In: Session Check In - 04/29/18 1149      Check-In   Supervising physician immediately available to respond to emergencies  [facility] immediately available    Physician(s)  Dr. Rodena Piety    Location  [facility]    Staff Present  Su Hilt, MS, ACSM RCEP, Exercise Physiologist;Lisa Ysidro Evert, RN;Carlette Carlton, RN, BSN    Medication changes reported      No    Fall or balance concerns reported     No    Tobacco Cessation  No Change    Warm-up and Cool-down  Performed as group-led instruction    Resistance Training Performed  Yes    VAD Patient?  No      Pain Assessment   Currently in Pain?  No/denies    Multiple Pain Sites  No       Capillary Blood Glucose: No results found for this or any previous visit (from the past 24 hour(s)).    Social History   Tobacco Use  Smoking Status Former Smoker  . Packs/day: 0.50  . Years: 50.00  . Pack years: 25.00  . Types: Cigarettes  . Last attempt to quit: 07/29/2011  . Years since quitting: 6.7  Smokeless Tobacco Never Used    Goals Met:  Exercise tolerated well No report of cardiac concerns or symptoms Strength training completed today  Goals Unmet:  Not Applicable  Comments: Service time is from 10:30a to 12:15p    Dr. Rush Farmer is Medical Director for Pulmonary Rehab at [facility]

## 2018-05-01 ENCOUNTER — Encounter (HOSPITAL_COMMUNITY): Payer: Commercial Managed Care - PPO

## 2018-05-05 NOTE — Progress Notes (Signed)
Subjective:    Patient ID: Troy Kenner [name], male    DOB: 1941-03-29, 77 y.o.   MRN: [id number]  HPI The patient is here for follow up.  Afib, Hypertension: He is taking his medication daily. He is compliant with a low sodium diet.  He does feel himself going in and out of A. fib on occasion and during that time will feel mild fluttering or palpitations and weakness.  He denies chest pain, edema and regular headaches. He is exercising regularly - 4/week.  He does not monitor his blood pressure at home.    Prediabetes:  He is compliant with a low sugar/carbohydrate diet.  He is exercising regularly.  Hyperlipidemia: He is taking his medication daily. He is compliant with a low fat/cholesterol diet. He is exercising regularly. He denies myalgias.   COPD:  He has SOB with exertion.  This is chronic in nature and has not changed.  He denies any coughing, wheezing or fevers.  He follows with pulmonary and is currently in pulmonary rehab.  He also exercises regularly on his own with a trainer.  Depression: He is taking his medication daily as prescribed. He denies any side effects from the medication. He feels his depression is well controlled and he is happy with his current dose of medication.    Medications and allergies reviewed with patient and updated if appropriate.  Patient Active Problem List   Diagnosis Date Noted  . Orthostatic hypotension   . Syncope 01/29/2018  . Chronic diastolic CHF (congestive heart failure) ([place]) 05/04/2017  . CAD (coronary artery disease) 05/03/2017  . MGUS (monoclonal gammopathy of unknown significance) 03/07/2016  . Benign essential HTN 01/19/2016  . Atherosclerotic peripheral vascular disease ([place]) 08/29/2015  . OSA (obstructive sleep apnea) 08/29/2015  . PAF (paroxysmal atrial fibrillation) ([place])   . Glaucoma 06/22/2015  . Dyspnea 02/04/2015  . Memory loss, short term 08/03/2014  . Anemia, unspecified 12/28/2013  . Carotid stenosis 07/24/2013  .  Occlusion and stenosis of carotid artery without mention of cerebral infarction 07/09/2013  . COPD GOLD II  07/09/2013  . Near syncope 07/07/2013  . Chronic renal disease, stage 3, moderately decreased glomerular filtration rate between 30-59 mL/min/1.73 square meter (HCC) 07/06/2013  . TIA (transient ischemic attack) 07/06/2013  . Chest pain 12/01/2012  . Fatigue 12/01/2012  . Right carotid bruit 12/01/2012  . Compulsive tobacco user syndrome 10/16/2012  . Basal cell carcinoma of skin 10/10/2012  . History of lung cancer 03/11/2012  . PAD (peripheral artery disease) ([place]) 12/16/2011  . AAA (abdominal aortic aneurysm) ([place]) 09/27/2011  . Squamous cell lung cancer ([place]) 11/08/2010  . GAIT IMBALANCE 03/02/2009  . Prediabetes 03/02/2009  . COLONIC POLYPS, HX OF 03/02/2009  . OBSTRUCTIVE CHRONIC BRONCHITIS WITHOUT EXACERBAT 01/15/2008  . HYPERPLASIA PROSTATE UNS W/O UR OBST & OTH LUTS 01/15/2008  . HYPERLIPIDEMIA 09/29/2007  . Peripheral vascular disease ([place]) 09/29/2007  . LEUKOPLAKIA, VOCAL CORDS 09/29/2007  . FEMORAL BRUIT 09/29/2007    Current Outpatient Medications on File Prior to Visit  Medication Sig Dispense Refill  . acetaminophen (TYLENOL) 325 MG tablet Take 650 mg every 6 (six) hours as needed by mouth for moderate pain or headache.    [name] amiodarone (PACERONE) 200 MG tablet TAKE 1/2 TABLET EVERY DAY. 15 tablet 6  . bimatoprost (LUMIGAN) 0.01 % SOLN Place 1 drop at bedtime into both eyes.     . Biotin 10000 MCG TABS Take 10,000 mcg daily by mouth.     [name]  budesonide-formoterol (SYMBICORT) 160-4.5 MCG/ACT inhaler Inhale 2 puffs into the lungs 2 (two) times daily. 1 Inhaler 12  . CARDURA 4 MG tablet TAKE (1/2) TABLET DAILY. 15 tablet 5  . dorzolamide-timolol (COSOPT) 22.3-6.8 MG/ML ophthalmic solution Place 2 drops 2 (two) times daily into the right eye.     [name] ELIQUIS 5 MG TABS tablet TAKE 1 TABLET BY MOUTH TWICE DAILY. 60 tablet 2  . FLUoxetine (PROZAC) 20 MG capsule TAKE (1)  CAPSULE DAILY. 30 capsule 5  . fluticasone (FLONASE) 50 MCG/ACT nasal spray Place 2 sprays into both nostrils daily.  0  . furosemide (LASIX) 20 MG tablet Take 1 tablet (20 mg total) by mouth daily. 30 tablet 11  . Hypertonic Nasal Wash (SINUS RINSE KIT NA) Place 1 application into the nose daily as needed (congestion).    . iron polysaccharides (POLY-IRON 150) 150 MG capsule Take 150 mg by mouth daily.    . Melatonin 5 MG TABS Take 5 mg by mouth at bedtime.    . montelukast (SINGULAIR) 10 MG tablet Take 1 tablet (10 mg total) by mouth at bedtime. 30 tablet 5  . Multiple Vitamin (MULTIVITAMIN WITH MINERALS) TABS tablet Take 1 tablet by mouth daily. Centrum Silver    . Polyethyl Glycol-Propyl Glycol (SYSTANE OP) Place 1-2 drops as needed into both eyes (for dry eyes).    . potassium chloride SA (K-DUR,KLOR-CON) 20 MEQ tablet TAKE 1 TABLET ONCE DAILY. 30 tablet 3  . ranitidine (ZANTAC) 150 MG tablet Take 150 mg by mouth daily as needed for heartburn.     . rosuvastatin (CRESTOR) 40 MG tablet TAKE 1 TABLET ONCE DAILY. 30 tablet 2   No current facility-administered medications on file prior to visit.     Past Medical History:  Diagnosis Date  . AAA (abdominal aortic aneurysm) (HCC) LAST ABD. [name]  JAN 2014  3.4CM   MONITORED BY DR [name]  . Arthritis   . Atrial fibrillation ([place])   . Benign essential HTN 01/19/2016  . Bilateral carotid artery stenosis DUPLEX 12-29-2012  BY DR [facility]   BILATERAL ICA STENOSIS 60-79%  . BPH (benign prostatic hypertrophy)   . Cancer ([place]) 01-25-12   LUNG  . Complication of anesthesia    had nausea after carotid artery surgery, states the medicine given to help the nausea, made it worse  . COPD (chronic obstructive pulmonary disease) ([place])   . Exertional dyspnea   . Frequency of urination   . GERD (gastroesophageal reflux disease)   . Glaucoma BOTH EYES   Dr Gershon Crane  . Glaucoma   . History of basal cell carcinoma excision   . History of lung cancer APRIL  2012  SQUAMOUS CELL---- S/P RIGHT LOWER LOBECTOMY AT [facility] --  NO CHEMORADIATION---  NO RECURRENCE    ONCOLOGIST- DR Tressie Stalker  LOV IN [facility] 10-27-2012  . Hx of adenomatous colonic polyps 2005    X 2; 1 hyperplastic polyp; Dr Olevia Perches  . Hyperlipidemia   . Hypertension   . Impaired fasting glucose 2007   108; A1c5.4%  . Lesion of bladder   . Microhematuria   . Nocturia   . OSA (obstructive sleep apnea) 08/29/2015   Mild with AHI overall 6/hr but severe during REM sleep with AHI 30/hr with oxygen saturations < 88% for 5.5 minutes of total sleep time.    [name] PAD (peripheral artery disease) ([place]) ABI'S  JAN 2014  0.65 ON RIGHT ;  1.04 ON LEFT  . Peripheral vascular disease (HCC) S/P ANGIOPLASTY  AND STENTING   FOLLOWED  BY DR [name]  . Spinal stenosis Sept. 2015  . Stroke [facility]) Jul 07, 2013   mini  TIA  . Urgency of urination     Past Surgical History:  Procedure Laterality Date  . ANGIO PLASTY     X 4 in legs  . AORTOGRAM  07-27-2002   MILD DIFFUSE ILIAC ARTERY OCCLUSIVE DISEASE /  LEFT RENAL ARTERY 20%/ PATENT LEFT FEM-POP GRAFT/ MILD SFA AND POPLITEAL ARTERY OCCLUSIVE DISEASE W/ SEVERE KIDNEY OCCLUSIVE DISEASE  . BASAL CELL CARCINOMA EXCISION     MULTIPLE TIMES--  RIGHT FOREARM, CHEEKS, AND BACK  . CARDIOVASCULAR STRESS TEST  12-08-2012  DR MCLEAN   NORMAL LEXISCAN WITH NO EXERCISE NUCLEAR STUDY/ EF 66%/   NO ISCHEMIA/ NO SIGNIFICANT CHANGE FROM PRIOR STUDY  . CARDIOVERSION N/A 02/14/2018   Procedure: CARDIOVERSION;  Surgeon: Larey Dresser, MD;  Location[facility] ENDOSCOPY;  Service: Cardiovascular;  Laterality: N/A;  . CAROTID ANGIOGRAM N/A 07/10/2013   Procedure: CAROTID ANGIOGRAM;  Surgeon: Elam Dutch, MD;  Location: [facility] CATH LAB;  Service: Cardiovascular;  Laterality: N/A;  . CAROTID ENDARTERECTOMY Right 07-14-13   cea  . CATARACT EXTRACTION W/ INTRAOCULAR LENS  IMPLANT, BILATERAL    . colon polyectomy    . CYSTOSCOPY W/ RETROGRADES Bilateral 01/21/2013   Procedure: CYSTOSCOPY  WITH RETROGRADE PYELOGRAM;  Surgeon: Molli Hazard, MD;  Location: [facility];  Service: Urology;  Laterality: Bilateral;   CYSTO, BLADDER BIOPSY, BILATERAL RETROGRADE PYELOGRAM  RAD TECH FROM RADIOLOGY PER JOY  . CYSTOSCOPY WITH BIOPSY N/A 01/21/2013   Procedure: CYSTOSCOPY WITH BIOPSY;  Surgeon: Molli Hazard, MD;  Location: [facility];  Service: Urology;  Laterality: N/A;  . ENDARTERECTOMY Right 07/14/2013   Procedure: ENDARTERECTOMY CAROTID;  Surgeon: Angelia Mould, MD;  Location: [place];  Service: Vascular;  Laterality: Right;  . EP IMPLANTABLE DEVICE N/A 08/12/2015   Procedure: Loop Recorder Insertion;  Surgeon: Thompson Grayer, MD;  Location: [place] CV LAB;  Service: Cardiovascular;  Laterality: N/A;  . FEMORAL-POPLITEAL BYPASS GRAFT Left 1994  [facility]   AND 2001  IN [place]  . FEMORAL-POPLITEAL BYPASS GRAFT Left 08/30/2015   Procedure: REVISION OF BYPASS GRAFT Left  FEMORAL-POPLITEAL ARTERY;  Surgeon: Angelia Mould, MD;  Location: [place];  Service: Vascular;  Laterality: Left;  . LARYNGOSCOPY  06-27-2004   BX VOCAL CORD  (LEUKOPLAKIA)  PER PT NO ISSUES SINCE  . LEFT HEART CATH AND CORONARY ANGIOGRAPHY N/A 08/20/2017   Procedure: LEFT HEART CATH AND CORONARY ANGIOGRAPHY;  Surgeon: Larey Dresser, MD;  Location: [address] CV LAB;  Service: Cardiovascular;  Laterality: N/A;  . LOWER EXTREMITY ANGIOGRAM Bilateral 08/29/2015   Procedure: Lower Extremity Angiogram;  Surgeon: Angelia Mould, MD;  Location: [place] CV LAB;  Service: Cardiovascular;  Laterality: Bilateral;  . LUNG LOBECTOMY  01/24/2011    RIGHT UPPER LOBE  (SQUAMOUS CELL CARCINOMA) Dr Dorthea Cove , [facility]. No chemotherapyor radiation  . PATCH ANGIOPLASTY Right 07/14/2013   Procedure: PATCH ANGIOPLASTY;  Surgeon: Angelia Mould, MD;  Location: [facility];  Service: Vascular;  Laterality: Right;  . PATCH ANGIOPLASTY Left 08/30/2015   Procedure: VEIN  PATCH ANGIOPLASTY OF PROXIMAL Left BYPASS GRAFT;  Surgeon: Angelia Mould, MD;  Location: [place];  Service: Vascular;  Laterality: Left;  . PERIPHERAL VASCULAR CATHETERIZATION N/A 08/29/2015   Procedure: Abdominal Aortogram;  Surgeon: Angelia Mould, MD;  Location: [place] CV LAB;  Service: Cardiovascular;  Laterality: N/A;  . TEE WITHOUT CARDIOVERSION N/A 02/14/2018   Procedure: TRANSESOPHAGEAL ECHOCARDIOGRAM (TEE);  Surgeon: Larey Dresser, MD;  Location: [facility] ENDOSCOPY;  Service: Cardiovascular;  Laterality: N/A;  . trabecular surgery     OS  . TRANSTHORACIC ECHOCARDIOGRAM  12-29-2012  DR [facility]   MILD LVH/  LVSF NORMAL/ EF 15-17%/  GRADE I DIASTOLIC DYSFUNCTION    Social History   Socioeconomic History  . Marital status: Married    Spouse name: [name]   . Number of children: 3  . Years of education: 12+  . Highest education level: Not on file  Occupational History  . Not on file  Social Needs  . Financial resource strain: Not on file  . Food insecurity:    Worry: Not on file    Inability: Not on file  . Transportation needs:    Medical: Not on file    Non-medical: Not on file  Tobacco Use  . Smoking status: Former Smoker    Packs/day: 0.50    Years: 50.00    Pack years: 25.00    Types: Cigarettes    Last attempt to quit: 07/29/2011    Years since quitting: 6.7  . Smokeless tobacco: Never Used  Substance and Sexual Activity  . Alcohol use: Yes    Alcohol/week: 0.0 oz    Comment:  socially, variable  . Drug use: No  . Sexual activity: Not on file  Lifestyle  . Physical activity:    Days per week: Not on file    Minutes per session: Not on file  . Stress: Not on file  Relationships  . Social connections:    Talks on phone: Not on file    Gets together: Not on file    Attends religious service: Not on file    Active member of club or organization: Not on file    Attends meetings of clubs or organizations: Not on file    Relationship status: Not on  file  Other Topics Concern  . Not on file  Social History Narrative   Patient lives at home with spouse [name]   Patient has 3 children    Patient is right handed     Family History  Problem Relation Age of Onset  . Stroke Mother        mini strokes  . Alcohol abuse Father   . Heart disease Father        MI after 75  . Stroke Father   . Hypertension Father   . Heart attack Father   . Heart disease Paternal Aunt        several  . Hypertension Paternal Aunt        several  . Stroke Paternal Aunt        several  . Stroke Paternal Uncle        several  . Heart disease Paternal Uncle        several;2 had MI pre 39    Review of Systems  Constitutional: Negative for chills and fever.  Respiratory: Positive for shortness of breath (chronic, stable). Negative for cough and wheezing.   Cardiovascular: Positive for palpitations. Negative for chest pain and leg swelling.  Neurological: Negative for light-headedness and headaches.       Objective:   Vitals:   05/06/18 1111  BP: 124/60  Pulse: (!) 49  Resp: 16  Temp: 97.8 F (36.6 C)  SpO2: 98%   BP Readings from Last 3 Encounters:  05/06/18 124/60  03/24/18 126/60  03/21/18 140/80   Wt Readings from Last 3 Encounters:  05/06/18 202 lb (91.6 kg)  04/22/18 201 lb 15.1 oz (91.6 kg)  04/15/18 201 lb 1 oz (91.2 kg)   Body mass index is 26.65 kg/m.   Physical Exam    Constitutional: Appears well-developed and well-nourished. No distress.  HENT:  Head: Normocephalic and atraumatic.  Neck: Neck supple. No tracheal deviation present. No thyromegaly present.  No cervical lymphadenopathy Cardiovascular: Normal rate, regular rhythm and normal heart sounds.   No murmur heard. No carotid bruit .  No edema Pulmonary/Chest: Effort normal and breath sounds normal. No respiratory distress. No has no wheezes. No rales.  Skin: Skin is warm and dry. Not diaphoretic.  Psychiatric: Normal mood and affect. Behavior is normal.       Assessment & Plan:    See Problem List for Assessment and Plan of chronic medical problems.

## 2018-05-06 ENCOUNTER — Encounter (HOSPITAL_COMMUNITY): Payer: Commercial Managed Care - PPO

## 2018-05-06 ENCOUNTER — Encounter: Payer: Self-pay | Admitting: Internal Medicine

## 2018-05-06 ENCOUNTER — Ambulatory Visit: Payer: Commercial Managed Care - PPO | Admitting: Internal Medicine

## 2018-05-06 VITALS — BP 124/60 | HR 49 | Temp 97.8°F | Resp 16 | Wt 202.0 lb

## 2018-05-06 DIAGNOSIS — I480 Paroxysmal atrial fibrillation: Secondary | ICD-10-CM

## 2018-05-06 DIAGNOSIS — R7303 Prediabetes: Secondary | ICD-10-CM

## 2018-05-06 DIAGNOSIS — F3289 Other specified depressive episodes: Secondary | ICD-10-CM | POA: Diagnosis not present

## 2018-05-06 DIAGNOSIS — I10 Essential (primary) hypertension: Secondary | ICD-10-CM

## 2018-05-06 DIAGNOSIS — E782 Mixed hyperlipidemia: Secondary | ICD-10-CM

## 2018-05-06 DIAGNOSIS — F32A Depression, unspecified: Secondary | ICD-10-CM | POA: Insufficient documentation

## 2018-05-06 DIAGNOSIS — F329 Major depressive disorder, single episode, unspecified: Secondary | ICD-10-CM | POA: Insufficient documentation

## 2018-05-06 NOTE — Assessment & Plan Note (Signed)
Controlled, stable Continue current dose of medication fluoxetine 20 mg daily

## 2018-05-06 NOTE — Assessment & Plan Note (Signed)
Sugars have been stable in the prediabetic range He is compliant with a low sugar/carbohydrate diet and exercising regularly We will hold off on blood work today and recheck A1c at his next visit

## 2018-05-06 NOTE — Assessment & Plan Note (Signed)
Continue statin Continue healthy diet and regular exercise

## 2018-05-06 NOTE — Assessment & Plan Note (Signed)
BP well controlled Current regimen effective and well tolerated Continue current medications at current doses  

## 2018-05-06 NOTE — Patient Instructions (Signed)
  Medications reviewed and updated.  No changes recommended at this time.  Your prescription(s) have been submitted to your pharmacy. Please take as directed and contact our office if you believe you are having problem(s) with the medication(s).   Please followup in 6 months

## 2018-05-06 NOTE — Assessment & Plan Note (Signed)
He does feel himself go in and out of A. fib-will feel mild palpitations/fluttering and weakness Following closely with cardiology Taking the Eliquis and amiodarone

## 2018-05-08 ENCOUNTER — Encounter (HOSPITAL_COMMUNITY)
Admission: RE | Admit: 2018-05-08 | Discharge: 2018-05-08 | Disposition: A | Discharge status: Home / Self Care | Payer: Commercial Managed Care - PPO | Source: Ambulatory Visit | Attending: Internal Medicine | Admitting: Internal Medicine

## 2018-05-08 DIAGNOSIS — J449 Chronic obstructive pulmonary disease, unspecified: Secondary | ICD-10-CM

## 2018-05-08 NOTE — Progress Notes (Signed)
Daily Session Note  Patient Details  Name: Troy Cicero [name] MRN: [id number] Date of Birth: 1940/10/09 Referring Provider:     Pulmonary Rehab Walk Test from 03/25/2018 in [place]  Referring Provider  Dr. Melvyn Novas      Encounter Date: 05/08/2018  Check In: Session Check In - 05/08/18 1237      Check-In   Supervising physician immediately available to respond to emergencies  [facility] immediately available    Physician(s)  Dr. Maryland Pink    Location  [facility]    Staff Present  Su Hilt, MS, ACSM RCEP, Exercise Physiologist;Lisa Colletta Maryland, RN, MHA    Medication changes reported      No    Fall or balance concerns reported     No    Tobacco Cessation  No Change    Warm-up and Cool-down  Performed as group-led instruction    Resistance Training Performed  Yes    VAD Patient?  No    PAD/SET Patient?  No      Pain Assessment   Currently in Pain?  No/denies    Multiple Pain Sites  No       Capillary Blood Glucose: No results found for this or any previous visit (from the past 24 hour(s)).    Social History   Tobacco Use  Smoking Status Former Smoker  . Packs/day: 0.50  . Years: 50.00  . Pack years: 25.00  . Types: Cigarettes  . Last attempt to quit: 07/29/2011  . Years since quitting: 6.7  Smokeless Tobacco Never Used    Goals Met:  Achieving weight loss Exercise tolerated well Personal goals reviewed  Goals Unmet:  Not Applicable  Comments: Service time is from 10:30a to 12:15p    Dr. Rush Farmer is Medical Director for Pulmonary Rehab at [facility].

## 2018-05-13 ENCOUNTER — Encounter (HOSPITAL_COMMUNITY)
Admission: RE | Admit: 2018-05-13 | Discharge: 2018-05-13 | Disposition: A | Discharge status: Home / Self Care | Payer: Commercial Managed Care - PPO | Source: Ambulatory Visit | Attending: Internal Medicine | Admitting: Internal Medicine

## 2018-05-13 DIAGNOSIS — J449 Chronic obstructive pulmonary disease, unspecified: Secondary | ICD-10-CM

## 2018-05-13 NOTE — Progress Notes (Signed)
  Daily Session Note  Patient Details  Name: Troy Witts [name] MRN: [id number] Date of Birth: Jun 23, 1941 Referring Provider:     Pulmonary Rehab Walk Test from 03/25/2018 in [place]  Referring Provider  Dr. Melvyn Novas      Encounter Date: 05/13/2018  Check In: Session Check In - 05/13/18 1137      Check-In   Supervising physician immediately available to respond to emergencies  [facility] immediately available    Physician(s)  Dr. Florene Glen    Location  [facility]    Staff Present  Su Hilt, MS, ACSM RCEP, Exercise Physiologist;Lisa Colletta Maryland, RN, MHA    Medication changes reported      No    Fall or balance concerns reported     No    Tobacco Cessation  No Change    Warm-up and Cool-down  Performed as group-led instruction    Resistance Training Performed  Yes    VAD Patient?  No    PAD/SET Patient?  No      Pain Assessment   Currently in Pain?  No/denies       Capillary Blood Glucose: No results found for this or any previous visit (from the past 24 hour(s)).    Social History   Tobacco Use  Smoking Status Former Smoker  . Packs/day: 0.50  . Years: 50.00  . Pack years: 25.00  . Types: Cigarettes  . Last attempt to quit: 07/29/2011  . Years since quitting: 6.7  Smokeless Tobacco Never Used    Goals Met:  Achieving weight loss Personal goals reviewed Queuing for purse lip breathing  Goals Unmet:  Not Applicable  Comments: Service time is from 10:30a to 12:00p    Dr. Rush Farmer is Medical Director for Pulmonary Rehab at [facility].

## 2018-05-15 ENCOUNTER — Encounter (HOSPITAL_COMMUNITY)
Admission: RE | Admit: 2018-05-15 | Discharge: 2018-05-15 | Disposition: A | Discharge status: Home / Self Care | Payer: Commercial Managed Care - PPO | Source: Ambulatory Visit | Attending: Internal Medicine | Admitting: Internal Medicine

## 2018-05-15 DIAGNOSIS — J449 Chronic obstructive pulmonary disease, unspecified: Secondary | ICD-10-CM

## 2018-05-15 NOTE — Progress Notes (Signed)
Daily Session Note  Patient Details  Name: Troy Arrona [name] MRN: [id number] Date of Birth: 31-Jan-1941 Referring Provider:     Pulmonary Rehab Walk Test from 03/25/2018 in [place]  Referring Provider  Dr. Melvyn Novas      Encounter Date: 05/15/2018  Check In: Session Check In - 05/15/18 1236      Check-In   Supervising physician immediately available to respond to emergencies  [facility] immediately available    Physician(s)  Dr. Florene Glen    Location  [facility]    Staff Present  Su Hilt, MS, ACSM RCEP, Exercise Physiologist;Lisa Ysidro Evert, RN;Carlette Carlton, RN, BSN    Medication changes reported      No    Fall or balance concerns reported     No    Tobacco Cessation  No Change    Warm-up and Cool-down  Performed as group-led instruction    Resistance Training Performed  Yes    VAD Patient?  No    PAD/SET Patient?  No      Pain Assessment   Currently in Pain?  No/denies    Multiple Pain Sites  No       Capillary Blood Glucose: No results found for this or any previous visit (from the past 24 hour(s)).    Social History   Tobacco Use  Smoking Status Former Smoker  . Packs/day: 0.50  . Years: 50.00  . Pack years: 25.00  . Types: Cigarettes  . Last attempt to quit: 07/29/2011  . Years since quitting: 6.8  Smokeless Tobacco Never Used    Goals Met:  Achieving weight loss Exercise tolerated well Personal goals reviewed  Goals Unmet:  Not Applicable  Comments: Service time is from 10:30a to 12:30p    Dr. Rush Farmer is Medical Director for Pulmonary Rehab at [facility].

## 2018-05-16 ENCOUNTER — Ambulatory Visit (INDEPENDENT_AMBULATORY_CARE_PROVIDER_SITE_OTHER): Payer: Commercial Managed Care - PPO | Admitting: *Deleted

## 2018-05-16 DIAGNOSIS — I480 Paroxysmal atrial fibrillation: Secondary | ICD-10-CM | POA: Diagnosis not present

## 2018-05-19 NOTE — Progress Notes (Signed)
Carelink Summary Report / Loop Recorder 

## 2018-05-20 ENCOUNTER — Encounter (HOSPITAL_COMMUNITY)
Admission: RE | Admit: 2018-05-20 | Discharge: 2018-05-20 | Disposition: A | Discharge status: Home / Self Care | Payer: Commercial Managed Care - PPO | Source: Ambulatory Visit | Attending: Internal Medicine | Admitting: Internal Medicine

## 2018-05-20 DIAGNOSIS — J449 Chronic obstructive pulmonary disease, unspecified: Secondary | ICD-10-CM

## 2018-05-20 NOTE — Progress Notes (Signed)
Pulmonary Individual Treatment Plan  Patient Details  Name: Troy River [name] MRN: [id number] Date of Birth: [birth year] Referring Provider:     Pulmonary Rehab Walk Test from 03/25/2018 in [place]  Referring Provider  Dr. Melvyn Novas      Initial Encounter Date:    Pulmonary Rehab Walk Test from 03/25/2018 in [place]  Date  03/25/18      Visit Diagnosis: Chronic obstructive pulmonary disease, unspecified COPD type ([place])  Patient's Home Medications on Admission:   Current Outpatient Medications:  .  acetaminophen (TYLENOL) 325 MG tablet, Take 650 mg every 6 (six) hours as needed by mouth for moderate pain or headache., Disp: , Rfl:  .  amiodarone (PACERONE) 200 MG tablet, TAKE 1/2 TABLET EVERY DAY., Disp: 15 tablet, Rfl: 6 .  bimatoprost (LUMIGAN) 0.01 % SOLN, Place 1 drop at bedtime into both eyes. , Disp: , Rfl:  .  Biotin 10000 MCG TABS, Take 10,000 mcg daily by mouth. , Disp: , Rfl:  .  budesonide-formoterol (SYMBICORT) 160-4.5 MCG/ACT inhaler, Inhale 2 puffs into the lungs 2 (two) times daily., Disp: 1 Inhaler, Rfl: 12 .  CARDURA 4 MG tablet, TAKE (1/2) TABLET DAILY., Disp: 15 tablet, Rfl: 5 .  dorzolamide-timolol (COSOPT) 22.3-6.8 MG/ML ophthalmic solution, Place 2 drops 2 (two) times daily into the right eye. , Disp: , Rfl:  .  ELIQUIS 5 MG TABS tablet, TAKE 1 TABLET BY MOUTH TWICE DAILY., Disp: 60 tablet, Rfl: 2 .  FLUoxetine (PROZAC) 20 MG capsule, TAKE (1) CAPSULE DAILY., Disp: 30 capsule, Rfl: 5 .  fluticasone (FLONASE) 50 MCG/ACT nasal spray, Place 2 sprays into both nostrils daily., Disp: , Rfl: 0 .  furosemide (LASIX) 20 MG tablet, Take 1 tablet (20 mg total) by mouth daily., Disp: 30 tablet, Rfl: 11 .  Hypertonic Nasal Wash (SINUS RINSE KIT NA), Place 1 application into the nose daily as needed (congestion)., Disp: , Rfl:  .  iron polysaccharides (POLY-IRON 150) 150 MG capsule, Take 150 mg by mouth daily., Disp:  , Rfl:  .  Melatonin 5 MG TABS, Take 5 mg by mouth at bedtime., Disp: , Rfl:  .  montelukast (SINGULAIR) 10 MG tablet, Take 1 tablet (10 mg total) by mouth at bedtime., Disp: 30 tablet, Rfl: 5 .  Multiple Vitamin (MULTIVITAMIN WITH MINERALS) TABS tablet, Take 1 tablet by mouth daily. Centrum Silver, Disp: , Rfl:  .  Polyethyl Glycol-Propyl Glycol (SYSTANE OP), Place 1-2 drops as needed into both eyes (for dry eyes)., Disp: , Rfl:  .  potassium chloride SA (K-DUR,KLOR-CON) 20 MEQ tablet, TAKE 1 TABLET ONCE DAILY., Disp: 30 tablet, Rfl: 3 .  ranitidine (ZANTAC) 150 MG tablet, Take 150 mg by mouth daily as needed for heartburn. , Disp: , Rfl:  .  rosuvastatin (CRESTOR) 40 MG tablet, TAKE 1 TABLET ONCE DAILY., Disp: 30 tablet, Rfl: 2  Past Medical History: Past Medical History:  Diagnosis Date  . AAA (abdominal aortic aneurysm) (HCC) LAST ABD. [name]  JAN 2014  3.4CM   MONITORED BY DR [name]  . Arthritis   . Atrial fibrillation ([place])   . Benign essential HTN 01/19/2016  . Bilateral carotid artery stenosis DUPLEX 12-29-2012  BY DR [facility]   BILATERAL ICA STENOSIS 60-79%  . BPH (benign prostatic hypertrophy)   . Cancer ([place]) 01-25-12   LUNG  . Complication of anesthesia    had nausea after carotid artery surgery, states the medicine given to help the nausea,  made it worse  . COPD (chronic obstructive pulmonary disease) ([place])   . Exertional dyspnea   . Frequency of urination   . GERD (gastroesophageal reflux disease)   . Glaucoma BOTH EYES   Dr Gershon Crane  . Glaucoma   . History of basal cell carcinoma excision   . History of lung cancer APRIL 2012  SQUAMOUS CELL---- S/P RIGHT LOWER LOBECTOMY AT [facility] --  NO CHEMORADIATION---  NO RECURRENCE    ONCOLOGIST- DR Tressie Stalker  LOV IN [facility] 10-27-2012  . Hx of adenomatous colonic polyps 2005    X 2; 1 hyperplastic polyp; Dr Olevia Perches  . Hyperlipidemia   . Hypertension   . Impaired fasting glucose 2007   108; A1c5.4%  . Lesion of bladder   . Microhematuria    . Nocturia   . OSA (obstructive sleep apnea) 08/29/2015   Mild with AHI overall 6/hr but severe during REM sleep with AHI 30/hr with oxygen saturations < 88% for 5.5 minutes of total sleep time.    [name] PAD (peripheral artery disease) ([place]) ABI'S  JAN 2014  0.65 ON RIGHT ;  1.04 ON LEFT  . Peripheral vascular disease ([place]) S/P ANGIOPLASTY AND STENTING   FOLLOWED  BY DR [name]  . Spinal stenosis Sept. 2015  . Stroke [facility]) Jul 07, 2013   mini  TIA  . Urgency of urination     Tobacco Use: Social History   Tobacco Use  Smoking Status Former Smoker  . Packs/day: 0.50  . Years: 50.00  . Pack years: 25.00  . Types: Cigarettes  . Last attempt to quit: 07/29/2011  . Years since quitting: 6.8  Smokeless Tobacco Never Used    Labs: Recent Review Flowsheet Data    Labs for ITP Cardiac and Pulmonary Rehab Latest Ref Rng & Units 01/04/2016 11/27/2016 05/03/2017 10/25/2017 11/04/2017   Cholestrol 0 - 200 mg/dL 95(L) 113 - 105 -   LDLCALC 0 - 99 mg/dL 39 39 - 50 -   HDL >40 mg/dL 39(L) 46 - 35(L) -   Trlycerides <150 mg/dL 86 140 - 101 -   Hemoglobin A1c - - - 5.7 - 5.9   TCO2 0 - 100 mmol/L - - - - -      Capillary Blood Glucose: Lab Results  Component Value Date   GLUCAP 97 08/29/2015   GLUCAP 111 (H) 07/07/2013   GLUCAP 112 (H) 12/22/2010     Pulmonary Assessment Scores: Pulmonary Assessment Scores    Row Name 11/28/17 0841 03/25/18 1543 03/27/18 0730     ADL UCSD   ADL Phase  Entry  Entry  Entry   SOB Score total  -  33  -     CAT Score   CAT Score  -  5  -     mMRC Score   mMRC Score  1  -  0      Pulmonary Function Assessment:   Exercise Target Goals: Exercise Program Goal: Individual exercise prescription set using results from initial 6 min walk test and THRR while considering  patient's activity barriers and safety.   Exercise Prescription Goal: Initial exercise prescription builds to 30-45 minutes a day of aerobic activity, 2-3 days per week.  Home exercise  guidelines will be given to patient during program as part of exercise prescription that the participant will acknowledge.  Activity Barriers & Risk Stratification: Activity Barriers & Cardiac Risk Stratification - 03/21/18 1014      Activity Barriers & Cardiac Risk Stratification  Activity Barriers  Shortness of Breath;Balance Concerns;History of Falls       6 Minute Walk: 6 Minute Walk    Row Name 11/28/17 [phone] 03/27/18 0724       6 Minute Walk   Phase  Initial  Initial    Distance  1200 feet  1194 feet    Walk Time  6 minutes  6 minutes    # of Rest Breaks  0  0    MPH  2.27  2.26    METS  3.3  2.76    RPE  13  12    Perceived Dyspnea   3  2    Symptoms  Yes (comment)  Yes (comment)    Comments  8/10 calf pain  4/10 calf pain    Resting HR  59 bpm  57 bpm    Resting BP  124/58  126/69    Resting Oxygen Saturation   94 %  98 %    Exercise Oxygen Saturation  during 6 min walk  92 %  94 %    Max Ex. HR  117 bpm  74 bpm    Max Ex. BP  146/60  192/67    2 Minute Post BP  -  152/78      Interval HR   1 Minute HR  98  63    2 Minute HR  98  73    3 Minute HR  96  71    4 Minute HR  117  74    5 Minute HR  117  73    6 Minute HR  70  70    2 Minute Post HR  59  57    Interval Heart Rate?  Yes  Yes      Interval Oxygen   Interval Oxygen?  Yes  -    Baseline Oxygen Saturation %  94 %  98 %    1 Minute Oxygen Saturation %  94 %  96 %    1 Minute Liters of Oxygen  0 L  0 L    2 Minute Oxygen Saturation %  93 %  95 %    2 Minute Liters of Oxygen  0 L  0 L    3 Minute Oxygen Saturation %  92 %  96 %    3 Minute Liters of Oxygen  0 L  0 L    4 Minute Oxygen Saturation %  93 %  94 %    4 Minute Liters of Oxygen  0 L  0 L    5 Minute Oxygen Saturation %  92 %  95 %    5 Minute Liters of Oxygen  0 L  0 L    6 Minute Oxygen Saturation %  94 %  94 %    6 Minute Liters of Oxygen  0 L  0 L    2 Minute Post Oxygen Saturation %  97 %  98 %    2 Minute Post Liters of Oxygen  0  L  0 L       Oxygen Initial Assessment: Oxygen Initial Assessment - 03/27/18 0724      Initial 6 min Walk   Oxygen Used  None      Program Oxygen Prescription   Program Oxygen Prescription  None       Oxygen Re-Evaluation: Oxygen Re-Evaluation    Row Name 01/03/18 1025 04/21/18 0943 05/19/18 1353  Program Oxygen Prescription   Program Oxygen Prescription  None  None  None       Home Oxygen   Home Oxygen Device  None  None  None     Sleep Oxygen Prescription  CPAP  CPAP  CPAP     Liters per minute  -  0  0     Home Exercise Oxygen Prescription  None  None  None     Home at Rest Exercise Oxygen Prescription  None  None  None     Compliance with Home Oxygen Use  Yes  Yes  Yes       Goals/Expected Outcomes   Short Term Goals  -  To learn and understand importance of maintaining oxygen saturations>88%;To learn and demonstrate proper use of respiratory medications;To learn and understand importance of monitoring SPO2 with pulse oximeter and demonstrate accurate use of the pulse oximeter.;To learn and demonstrate proper pursed lip breathing techniques or other breathing techniques.  To learn and understand importance of maintaining oxygen saturations>88%;To learn and demonstrate proper use of respiratory medications;To learn and understand importance of monitoring SPO2 with pulse oximeter and demonstrate accurate use of the pulse oximeter.;To learn and demonstrate proper pursed lip breathing techniques or other breathing techniques.     Long  Term Goals  -  Verbalizes importance of monitoring SPO2 with pulse oximeter and return demonstration;Maintenance of O2 saturations>88%;Exhibits proper breathing techniques, such as pursed lip breathing or other method taught during program session;Compliance with respiratory medication;Demonstrates proper use of MDI's  Verbalizes importance of monitoring SPO2 with pulse oximeter and return demonstration;Maintenance of O2  saturations>88%;Exhibits proper breathing techniques, such as pursed lip breathing or other method taught during program session;Compliance with respiratory medication;Demonstrates proper use of MDI's     Goals/Expected Outcomes  Cont. to be compliant with CPAP  Cont. to be compliant with CPAP  Cont. to be compliant with CPAP        Oxygen Discharge (Final Oxygen Re-Evaluation): Oxygen Re-Evaluation - 05/19/18 1353      Program Oxygen Prescription   Program Oxygen Prescription  None      Home Oxygen   Home Oxygen Device  None    Sleep Oxygen Prescription  CPAP    Liters per minute  0    Home Exercise Oxygen Prescription  None    Home at Rest Exercise Oxygen Prescription  None    Compliance with Home Oxygen Use  Yes      Goals/Expected Outcomes   Short Term Goals  To learn and understand importance of maintaining oxygen saturations>88%;To learn and demonstrate proper use of respiratory medications;To learn and understand importance of monitoring SPO2 with pulse oximeter and demonstrate accurate use of the pulse oximeter.;To learn and demonstrate proper pursed lip breathing techniques or other breathing techniques.    Long  Term Goals  Verbalizes importance of monitoring SPO2 with pulse oximeter and return demonstration;Maintenance of O2 saturations>88%;Exhibits proper breathing techniques, such as pursed lip breathing or other method taught during program session;Compliance with respiratory medication;Demonstrates proper use of MDI's    Goals/Expected Outcomes  Cont. to be compliant with CPAP       Initial Exercise Prescription: Initial Exercise Prescription - 03/27/18 0700      Date of Initial Exercise RX and Referring Provider   Date  03/25/18    Referring Provider  Dr. Melvyn Novas      Bike   Level  0.6    Minutes  17      NuStep  Level  4    SPM  80    Minutes  17    METs  1.7      Track   Laps  10    Minutes  17      Prescription Details   Frequency (times per week)  2     Duration  Progress to 45 minutes of aerobic exercise without signs/symptoms of physical distress      Intensity   THRR 40-80% of Max Heartrate  57-114    Ratings of Perceived Exertion  11-13    Perceived Dyspnea  0-4      Progression   Progression  Continue progressive overload as per policy without signs/symptoms or physical distress.      Resistance Training   Training Prescription  Yes    Weight  blue bands    Reps  10-15       Perform Capillary Blood Glucose checks as needed.  Exercise Prescription Changes:  Exercise Prescription Changes    Row Name 12/03/17 1100 12/17/17 1300 12/19/17 1300 12/31/17 1200 01/14/18 1220     Response to Exercise   Blood Pressure (Admit)  146/80  -  134/70  150/66  134/60   Blood Pressure (Exercise)  158/60  -  132/58  118/60  148/56   Blood Pressure (Exit)  130/58  -  124/60  130/70  120/62   Heart Rate (Admit)  54 bpm  -  60 bpm  53 bpm  63 bpm   Heart Rate (Exercise)  64 bpm  -  54 bpm  60 bpm  62 bpm   Heart Rate (Exit)  48 bpm  -  59 bpm  49 bpm  57 bpm   Oxygen Saturation (Admit)  97 %  -  95 %  97 %  94 %   Oxygen Saturation (Exercise)  95 %  -  94 %  94 %  93 %   Oxygen Saturation (Exit)  98 %  -  94 %  97 %  97 %   Rating of Perceived Exertion (Exercise)  13  -  _0 Perceived Dyspnea (Exercise)  2  -  _1 Duration  Progress to 45 minutes of aerobic exercise without signs/symptoms of physical distress  -  Continue with 45 min of aerobic exercise without signs/symptoms of physical distress.  Continue with 45 min of aerobic exercise without signs/symptoms of physical distress.  Continue with 45 min of aerobic exercise without signs/symptoms of physical distress.   Intensity  Other (comment)  -  THRR unchanged  THRR unchanged  THRR unchanged     Progression   Progression  - 40-80% HRR  -  Continue to progress workloads to maintain intensity without signs/symptoms of physical distress.  Continue to progress workloads to  maintain intensity without signs/symptoms of physical distress.  Continue to progress workloads to maintain intensity without signs/symptoms of physical distress.     Resistance Training   Training Prescription  Yes  -  Yes  Yes  Yes   Weight  BLUE BANDS  -  Blue bands  Blue bands  Blue bands   Reps  10-15  -  10-15  10-15  10-15   Time  10 Minutes  -  10 Minutes  10 Minutes  10 Minutes     Bike   Level  0.6  -  _2 Minutes  17  -  _0 NuStep   Level  2  -  _1 SPM  80  -  80  80  80   Minutes  17  -  _2 METs  1.6  -  1.8  1.8  1.9     Track   Laps  10  -  _3 Minutes  17  -  _4 Home Exercise Plan   Plans to continue exercise at  -  Home (comment)  -  -  -   Frequency  -  Add 2 additional days to program exercise sessions.  -  -  -   Row Name 04/01/18 1200 04/08/18 1400 04/22/18 1200 05/08/18 0700 05/20/18 1300     Response to Exercise   Blood Pressure (Admit)  -  124/52  122/66  94/62  104/60   Blood Pressure (Exercise)  -  128/60  140/60  144/58  136/70   Blood Pressure (Exit)  -  120/68  110/60  112/56  102/60   Heart Rate (Admit)  -  49 bpm  81 bpm  50 bpm  63 bpm   Heart Rate (Exercise)  -  56 bpm  88 bpm  68 bpm  79 bpm   Heart Rate (Exit)  -  51 bpm  81 bpm  84 bpm  76 bpm   Oxygen Saturation (Admit)  -  98 %  97 %  98 %  98 %   Oxygen Saturation (Exercise)  -  95 %  99 %  95 %  95 %   Oxygen Saturation (Exit)  -  95 %  99 %  97 %  97 %   Rating of Perceived Exertion (Exercise)  -  _5 Perceived Dyspnea (Exercise)  -  _6 Duration  -  Progress to 45 minutes of aerobic exercise without signs/symptoms of physical distress  Progress to 45 minutes of aerobic exercise without signs/symptoms of physical distress  Progress to 45 minutes of aerobic exercise without signs/symptoms of physical distress  Progress to 45 minutes of aerobic exercise without signs/symptoms of physical distress   Intensity  -   THRR unchanged  THRR unchanged  THRR unchanged  THRR unchanged     Progression   Progression  -  Continue to progress workloads to maintain intensity without signs/symptoms of physical distress.  Continue to progress workloads to maintain intensity without signs/symptoms of physical distress.  Continue to progress workloads to maintain intensity without signs/symptoms of physical distress.  Continue to progress workloads to maintain intensity without signs/symptoms of physical distress.     Resistance Training   Training Prescription  -  Yes  Yes  Yes  Yes   Weight  -  blue bands  blue bands  blue bands  blue bands   Reps  -  10-15  10-15  10-15  10-15   Time  -  10 Minutes  10 Minutes  10 Minutes  10 Minutes     Interval Training   Interval Training  -  No  No  No  No     Bike   Level  -  _7 Minutes  -  _0 NuStep   Level  -  _1 SPM  -  80  80  80  80   Minutes  -  _2 METs  -  1.6  1.9  2  1.9     Track   Laps  -  _3 Minutes  -  _4 Home Exercise Plan   Plans to continue exercise at  Home (comment)  -  -  -  -   Frequency  Add 2 additional days to program exercise sessions.  -  -  -  -      Exercise Comments:  Exercise Comments    Row Name 12/17/17 1308 02/10/18 1425 04/01/18 1243       Exercise Comments  Home exercise completed  Patient is now d/c from program due to hospitalization  Home exercise completed        Exercise Goals and Review:  Exercise Goals    Row Name 03/21/18 1017             Exercise Goals   Increase Physical Activity  Yes       Intervention  Provide advice, education, support and counseling about physical activity/exercise needs.;Develop an individualized exercise prescription for aerobic and resistive training based on initial evaluation findings, risk stratification, comorbidities and participant's personal goals.       Expected Outcomes  Short Term: Attend rehab  on a regular basis to increase amount of physical activity.;Long Term: Add in home exercise to make exercise part of routine and to increase amount of physical activity.;Long Term: Exercising regularly at least 3-5 days a week.       Increase Strength and Stamina  Yes       Intervention  Provide advice, education, support and counseling about physical activity/exercise needs.;Develop an individualized exercise prescription for aerobic and resistive training based on initial evaluation findings, risk stratification, comorbidities and participant's personal goals.       Expected Outcomes  Short Term: Increase workloads from initial exercise prescription for resistance, speed, and METs.;Short Term: Perform resistance training exercises routinely during rehab and add in resistance training at home;Long Term: Improve cardiorespiratory fitness, muscular endurance and strength as measured by increased METs and functional capacity (6MWT)       Able to understand and use rate of perceived exertion (RPE) scale  Yes       Intervention  Provide education and explanation on how to use RPE scale       Expected Outcomes  Short Term: Able to use RPE daily in rehab to express subjective intensity level;Long Term:  Able to use RPE to guide intensity level when exercising independently       Able to understand and use Dyspnea scale  Yes       Intervention  Provide education and explanation on how to use Dyspnea scale       Expected Outcomes  Short Term: Able to use Dyspnea scale daily in rehab to express subjective sense of shortness of breath during exertion;Long Term: Able to use Dyspnea scale to guide intensity level when exercising independently       Knowledge and understanding of Target Heart Rate Range (THRR)  Yes       Intervention  Provide education and  explanation of THRR including how the numbers were predicted and where they are located for reference       Expected Outcomes  Short Term: Able to state/look up  THRR;Long Term: Able to use THRR to govern intensity when exercising independently;Short Term: Able to use daily as guideline for intensity in rehab       Understanding of Exercise Prescription  Yes       Intervention  Provide education, explanation, and written materials on patient's individual exercise prescription       Expected Outcomes  Short Term: Able to explain program exercise prescription;Long Term: Able to explain home exercise prescription to exercise independently          Exercise Goals Re-Evaluation : Exercise Goals Re-Evaluation    Row Name 12/09/17 0736 01/03/18 1026 03/28/18 1054 04/21/18 0956 05/19/18 1353     Exercise Goal Re-Evaluation   Exercise Goals Review  Increase Strength and Stamina;Able to understand and use Dyspnea scale;Increase Physical Activity;Able to understand and use rate of perceived exertion (RPE) scale;Knowledge and understanding of Target Heart Rate Range (THRR);Understanding of Exercise Prescription  Increase Strength and Stamina;Able to understand and use Dyspnea scale;Increase Physical Activity;Able to understand and use rate of perceived exertion (RPE) scale;Knowledge and understanding of Target Heart Rate Range (THRR);Understanding of Exercise Prescription  Increase Strength and Stamina;Increase Physical Activity;Able to understand and use rate of perceived exertion (RPE) scale;Knowledge and understanding of Target Heart Rate Range (THRR);Understanding of Exercise Prescription;Able to understand and use Dyspnea scale;Able to check pulse independently  Increase Strength and Stamina;Increase Physical Activity;Able to understand and use rate of perceived exertion (RPE) scale;Knowledge and understanding of Target Heart Rate Range (THRR);Understanding of Exercise Prescription;Able to understand and use Dyspnea scale  Increase Strength and Stamina;Increase Physical Activity;Able to understand and use rate of perceived exertion (RPE) scale;Knowledge and  understanding of Target Heart Rate Range (THRR);Understanding of Exercise Prescription;Able to understand and use Dyspnea scale   Comments  Patient has only attended two rehab sessions. Will cont. to monitor and motivate as able.  PAD is a barrier for this patient. He is progressing wel and has started to exercise at home. Patient is able to walk up to 10 laps (200 ft) in 15 minutes. This includes breaks due to his leg pain. MET level still places him in a "low" functional level. Will cont to monitor patient and progress as able.   Patient will restart Pulmonary Rehab on 04/01/18. Will monitor and motivate as tolerated.   Patient has only completed 4 rehab sessions. Will cont. to monitor and progress as able.  Patient is able to walk 13 laps (200 ft each) in 15 minutes. Patient is open to new workloads but is limited by his claudication pain. MET average places him in a low level. Will cont. to monitor and progress as able.   Expected Outcomes  Through exercise at rehab, patient will increase strength and stamina and gain enough confidence to start an exercise regime at home.   Through exercise at rehab, patient will increase strength and stamina and gain enough confidence to start an exercise regime at home.   Through exercise at rehab and at home, the patient will decrease shortness of breath with daily activities and feel confident in carrying out an exercise regime at home.   Through exercise at rehab and at home, the patient will decrease shortness of breath with daily activities and feel confident in carrying out an exercise regime at home.   Through exercise at rehab  and at home, the patient will decrease shortness of breath with daily activities and feel confident in carrying out an exercise regime at home.       Discharge Exercise Prescription (Final Exercise Prescription Changes): Exercise Prescription Changes - 05/20/18 1300      Response to Exercise   Blood Pressure (Admit)  104/60    Blood  Pressure (Exercise)  136/70    Blood Pressure (Exit)  102/60    Heart Rate (Admit)  63 bpm    Heart Rate (Exercise)  79 bpm    Heart Rate (Exit)  76 bpm    Oxygen Saturation (Admit)  98 %    Oxygen Saturation (Exercise)  95 %    Oxygen Saturation (Exit)  97 %    Rating of Perceived Exertion (Exercise)  13    Perceived Dyspnea (Exercise)  2    Duration  Progress to 45 minutes of aerobic exercise without signs/symptoms of physical distress    Intensity  THRR unchanged      Progression   Progression  Continue to progress workloads to maintain intensity without signs/symptoms of physical distress.      Resistance Training   Training Prescription  Yes    Weight  blue bands    Reps  10-15    Time  10 Minutes      Interval Training   Interval Training  No      Bike   Level  5    Minutes  17      NuStep   Level  4    SPM  80    Minutes  17    METs  1.9      Track   Laps  10    Minutes  17       Nutrition:  Target Goals: Understanding of nutrition guidelines, daily intake of sodium <1545m, cholesterol <2066m calories 30% from fat and 7% or less from saturated fats, daily to have 5 or more servings of fruits and vegetables.  Biometrics:    Nutrition Therapy Plan and Nutrition Goals: Nutrition Therapy & Goals - 04/08/18 1147      Nutrition Therapy   Diet  Heart Healthy      Personal Nutrition Goals   Nutrition Goal  Pt to identify and limit food sources of sodium.    Personal Goal #2  Identify food quantities necessary to achieve wt loss of  -2# per week to a goal wt loss of 6-24 lb at graduation from pulmonary rehab.      Intervention Plan   Intervention  Prescribe, educate and counsel regarding individualized specific dietary modifications aiming towards targeted core components such as weight, hypertension, lipid management, diabetes, heart failure and other comorbidities.    Expected Outcomes  Short Term Goal: Understand basic principles of dietary content,  such as calories, fat, sodium, cholesterol and nutrients.;Long Term Goal: Adherence to prescribed nutrition plan.       Nutrition Assessments: Nutrition Assessments - 02/20/18 1227      Rate Your Plate Scores   Pre Score  42       Nutrition Goals Re-Evaluation: Nutrition Goals Re-Evaluation    [place] 04/08/18 1148             Goals   Nutrition Goal  Pt to identify and limit food sources of sodium.       Comment  Identify food quantities necessary to achieve wt loss of  -2# per week to a goal wt loss of  6-24 lb at graduation from pulmonary rehab.          Nutrition Goals Discharge (Final Nutrition Goals Re-Evaluation): Nutrition Goals Re-Evaluation - 04/08/18 1148      Goals   Nutrition Goal  Pt to identify and limit food sources of sodium.    Comment  Identify food quantities necessary to achieve wt loss of  -2# per week to a goal wt loss of 6-24 lb at graduation from pulmonary rehab.       Psychosocial: Target Goals: Acknowledge presence or absence of significant depression and/or stress, maximize coping skills, provide positive support system. Participant is able to verbalize types and ability to use techniques and skills needed for reducing stress and depression.  Initial Review & Psychosocial Screening: Initial Psych Review & Screening - 03/21/18 1010      Initial Review   Current issues with  None Identified;History of Depression   diminshed vision caused change in activity level and hobbies     [place]?  Yes      Barriers   Psychosocial barriers to participate in program  There are no identifiable barriers or psychosocial needs.      Screening Interventions   Interventions  Encouraged to exercise    Expected Outcomes  Long Term goal: The participant improves quality of Life and PHQ9 Scores as seen by post scores and/or verbalization of changes;Short Term goal: Identification and review with participant of any Quality of Life  or Depression concerns found by scoring the questionnaire.       Quality of Life Scores:  Scores of 19 and below usually indicate a poorer quality of life in these areas.  A difference of  2-3 points is a clinically meaningful difference.  A difference of 2-3 points in the total score of the Quality of Life Index has been associated with significant improvement in overall quality of life, self-image, physical symptoms, and general health in studies assessing change in quality of life.  PHQ-9: Recent Review Flowsheet Data    Depression screen [facility] 2/9 03/21/2018 11/18/2017 11/04/2017 01/12/2015   Decreased Interest 0 0 0 0   Down, Depressed, Hopeless 0 0 0 0   PHQ - 2 Score 0 0 0 0   Altered sleeping 0 - - -   Tired, decreased energy 1 - - -   Change in appetite 0 - - -   Trouble concentrating 0 - - -   Moving slowly or fidgety/restless 0 - - -   Suicidal thoughts 0 - - -   PHQ-9 Score 1 - - -   Difficult doing work/chores Not difficult at all - - -     Interpretation of Total Score  Total Score Depression Severity:  1-4 = Minimal depression, 5-9 = Mild depression, 10-14 = Moderate depression, 15-19 = Moderately severe depression, 20-27 = Severe depression   Psychosocial Evaluation and Intervention: Psychosocial Evaluation - 05/20/18 1653      Psychosocial Evaluation & Interventions   Interventions  Encouraged to exercise with the program and follow exercise prescription    Comments  no psychosocial issues identified    Expected Outcomes  no barriers to participation in pulmonary rehab    Continue Psychosocial Services   Follow up required by staff       Psychosocial Re-Evaluation: Psychosocial Re-Evaluation    Row Name 12/03/17 1238 01/06/18 1010 02/03/18 1029 04/01/18 1313 04/21/18 1635     Psychosocial Re-Evaluation   Current issues with  None Identified  None Identified  None Identified  None Identified  None Identified   Comments  -  -  None identified at last face to face  encounter during exercise session.  none identified  none identified   Expected Outcomes  no barriers to participation in pulmonary rehab  no barriers to participation in pulmonary rehab  -  -  no barriers to participation in pulmonary rehab   Interventions  Encouraged to attend Pulmonary Rehabilitation for the exercise  Encouraged to attend Pulmonary Rehabilitation for the exercise  -  Encouraged to attend Pulmonary Rehabilitation for the exercise  Relaxation education   Continue Psychosocial Services   No Follow up required  Follow up required by staff  -  No Follow up required  Follow up required by staff   [place] Name 05/20/18 1653             Psychosocial Re-Evaluation   Current issues with  None Identified       Comments  none identified       Expected Outcomes  no barriers to participation in pulmonary rehab       Interventions  Relaxation education;Stress management education;Encouraged to attend Pulmonary Rehabilitation for the exercise       Continue Psychosocial Services   Follow up required by staff          Psychosocial Discharge (Final Psychosocial Re-Evaluation): Psychosocial Re-Evaluation - 05/20/18 1653      Psychosocial Re-Evaluation   Current issues with  None Identified    Comments  none identified    Expected Outcomes  no barriers to participation in pulmonary rehab    Interventions  Relaxation education;Stress management education;Encouraged to attend Pulmonary Rehabilitation for the exercise    Continue Psychosocial Services   Follow up required by staff       Education: Education Goals: Education classes will be provided on a weekly basis, covering required topics. Participant will state understanding/return demonstration of topics presented.  Learning Barriers/Preferences: Learning Barriers/Preferences - 03/21/18 1012      Learning Barriers/Preferences   Learning Barriers  Sight;Hearing   has aids for hearing but does not use them   Learning Preferences   Individual Instruction;Skilled Demonstration;Verbal Instruction       Education Topics: Risk Factor Reduction:  -Group instruction that is supported by a PowerPoint presentation. Instructor discusses the definition of a risk factor, different risk factors for pulmonary disease, and how the heart and lungs work together.     Nutrition for Pulmonary Patient:  -Group instruction provided by PowerPoint slides, verbal discussion, and written materials to support subject matter. The instructor gives an explanation and review of healthy diet recommendations, which includes a discussion on weight management, recommendations for fruit and vegetable consumption, as well as protein, fluid, caffeine, fiber, sodium, sugar, and alcohol. Tips for eating when patients are short of breath are discussed.   PULMONARY REHAB CHRONIC OBSTRUCTIVE PULMONARY DISEASE from 05/15/2018 in [place]  Date  12/19/17  Educator  Parke Simmers  Instruction Review Code  2- Demonstrated Understanding      Pursed Lip Breathing:  -Group instruction that is supported by demonstration and informational handouts. Instructor discusses the benefits of pursed lip and diaphragmatic breathing and detailed demonstration on how to preform both.     Oxygen Safety:  -Group instruction provided by PowerPoint, verbal discussion, and written material to support subject matter. There is an overview of "What is Oxygen" and "Why do we need it".  Instructor  also reviews how to create a safe environment for oxygen use, the importance of using oxygen as prescribed, and the risks of noncompliance. There is a brief discussion on traveling with oxygen and resources the patient may utilize.   Oxygen Equipment:  -Group instruction provided by [facility] Staff utilizing handouts, written materials, and equipment demonstrations.   PULMONARY REHAB CHRONIC OBSTRUCTIVE PULMONARY DISEASE from 05/15/2018 in [place]  Date  04/24/18  Educator  Ace Gins  Instruction Review Code  2- Demonstrated Understanding      Signs and Symptoms:  -Group instruction provided by written material and verbal discussion to support subject matter. Warning signs and symptoms of infection, stroke, and heart attack are reviewed and when to call the physician/911 reinforced. Tips for preventing the spread of infection discussed.   PULMONARY REHAB CHRONIC OBSTRUCTIVE PULMONARY DISEASE from 05/15/2018 in [place]  Date  04/10/18  Educator  Remo Lipps  Instruction Review Code  1- Verbalizes Understanding      Advanced Directives:  -Group instruction provided by verbal instruction and written material to support subject matter. Instructor reviews Advanced Directive laws and proper instruction for filling out document.   Pulmonary Video:  -Group video education that reviews the importance of medication and oxygen compliance, exercise, good nutrition, pulmonary hygiene, and pursed lip and diaphragmatic breathing for the pulmonary patient.   Exercise for the Pulmonary Patient:  -Group instruction that is supported by a PowerPoint presentation. Instructor discusses benefits of exercise, core components of exercise, frequency, duration, and intensity of an exercise routine, importance of utilizing pulse oximetry during exercise, safety while exercising, and options of places to exercise outside of rehab.     Pulmonary Medications:  -Verbally interactive group education provided by instructor with focus on inhaled medications and proper administration.   PULMONARY REHAB CHRONIC OBSTRUCTIVE PULMONARY DISEASE from 05/15/2018 in [place]  Date  12/26/17  Educator  pharm  Instruction Review Code  2- Demonstrated Understanding      Anatomy and Physiology of the Respiratory System and Intimacy:  -Group instruction provided by PowerPoint, verbal discussion,  and written material to support subject matter. Instructor reviews respiratory cycle and anatomical components of the respiratory system and their functions. Instructor also reviews differences in obstructive and restrictive respiratory diseases with examples of each. Intimacy, Sex, and Sexuality differences are reviewed with a discussion on how relationships can change when diagnosed with pulmonary disease. Common sexual concerns are reviewed.   MD DAY -A group question and answer session with a medical doctor that allows participants to ask questions that relate to their pulmonary disease state.   PULMONARY REHAB CHRONIC OBSTRUCTIVE PULMONARY DISEASE from 05/15/2018 in [place]  Date  01/09/18  Educator  Dr. Nelda Marseille  Instruction Review Code  1- Verbalizes Understanding      OTHER EDUCATION -Group or individual verbal, written, or video instructions that support the educational goals of the pulmonary rehab program.   PULMONARY REHAB CHRONIC OBSTRUCTIVE PULMONARY DISEASE from 05/15/2018 in [place]  Date  01/02/18 Rodney Booze a sedentary lifestyle]  Educator  EP  Instruction Review Code  1- Verbalizes Understanding      Holiday Eating Survival Tips:  -Group instruction provided by PowerPoint slides, verbal discussion, and written materials to support subject matter. The instructor gives patients tips, tricks, and techniques to help them not only survive but enjoy the holidays despite the onslaught of food  that accompanies the holidays.   Knowledge Questionnaire Score: Knowledge Questionnaire Score - 03/25/18 1543      Knowledge Questionnaire Score   Pre Score  16/18       Core Components/Risk Factors/Patient Goals at Admission: Personal Goals and Risk Factors at Admission - 03/21/18 1007      Core Components/Risk Factors/Patient Goals on Admission    Weight Management  Weight Loss;Yes    Intervention  Weight Management:  Provide education and appropriate resources to help participant work on and attain dietary goals.;Weight Management: Develop a combined nutrition and exercise program designed to reach desired caloric intake, while maintaining appropriate intake of nutrient and fiber, sodium and fats, and appropriate energy expenditure required for the weight goal.;Weight Management/Obesity: Establish reasonable short term and long term weight goals.;Obesity: Provide education and appropriate resources to help participant work on and attain dietary goals.    Admit Weight  202 lb 6.1 oz (91.8 kg)    Goal Weight: Short Term  195 lb (88.5 kg)    Goal Weight: Long Term  190 lb (86.2 kg)    Expected Outcomes  Short Term: Continue to assess and modify interventions until short term weight is achieved;Long Term: Adherence to nutrition and physical activity/exercise program aimed toward attainment of established weight goal;Weight Loss: Understanding of general recommendations for a balanced deficit meal plan, which promotes 1-2 lb weight loss per week and includes a negative energy balance of [id number] kcal/d;Understanding recommendations for meals to include 15-35% energy as protein, 25-35% energy from fat, 35-60% energy from carbohydrates, less than 272m of dietary cholesterol, 20-35 gm of total fiber daily;Understanding of distribution of calorie intake throughout the day with the consumption of 4-5 meals/snacks    Improve shortness of breath with ADL's  Yes    Intervention  Provide education, individualized exercise plan and daily activity instruction to help decrease symptoms of SOB with activities of daily living.    Expected Outcomes  Short Term: Improve cardiorespiratory fitness to achieve a reduction of symptoms when performing ADLs;Long Term: Be able to perform more ADLs without symptoms or delay the onset of symptoms    Heart Failure  Yes    Intervention  Provide a combined exercise and nutrition program that is  supplemented with education, support and counseling about heart failure. Directed toward relieving symptoms such as shortness of breath, decreased exercise tolerance, and extremity edema.    Expected Outcomes  Improve functional capacity of life;Short term: Attendance in program 2-3 days a week with increased exercise capacity. Reported lower sodium intake. Reported increased fruit and vegetable intake. Reports medication compliance.;Long term: Adoption of self-care skills and reduction of barriers for early signs and symptoms recognition and intervention leading to self-care maintenance.    Hypertension  Yes    Intervention  Provide education on lifestyle modifcations including regular physical activity/exercise, weight management, moderate sodium restriction and increased consumption of fresh fruit, vegetables, and low fat dairy, alcohol moderation, and smoking cessation.;Monitor prescription use compliance.    Expected Outcomes  Short Term: Continued assessment and intervention until BP is < 140/951mHG in hypertensive participants. < 130/8026mG in hypertensive participants with diabetes, heart failure or chronic kidney disease.;Long Term: Maintenance of blood pressure at goal levels.    Lipids  Yes    Intervention  Provide education and support for participant on nutrition & aerobic/resistive exercise along with prescribed medications to achieve LDL <7m110mDL >40mg17m Expected Outcomes  Short Term: Participant states understanding of desired cholesterol values  and is compliant with medications prescribed. Participant is following exercise prescription and nutrition guidelines.;Long Term: Cholesterol controlled with medications as prescribed, with individualized exercise RX and with personalized nutrition plan. Value goals: LDL < 83m, HDL > 40 mg.       Core Components/Risk Factors/Patient Goals Review:  Goals and Risk Factor Review    Row Name 12/03/17 1235 01/06/18 1009 02/03/18 1026 04/01/18  1311 04/21/18 1631     Core Components/Risk Factors/Patient Goals Review   Personal Goals Review  Improve shortness of breath with ADL's  Improve shortness of breath with ADL's  Improve shortness of breath with ADL's  Weight Management/Obesity;Improve shortness of breath with ADL's;Heart Failure;Hypertension;Lipids  Weight Management/Obesity;Improve shortness of breath with ADL's;Heart Failure   Review  Has just begun program, has exercised once in program, tolerated it well.  Too early to see progression toward goals  patient is doing well in the pulmonary rehab program. he states he can see a significant improvement in his shortness of breath at home with ADLs and with exercise. he has even started exercising outside of rehab focusing not only on aerobic exercise but stregnth training as well.  patient was doing well in pulmonary rehab until last week when he was admitted to the hospital for multiple falls, dehydration, and URI. He was previously seen by MD for URI and prescribed prednisone with antibiotics. His health continued to decline and he became dehydrated which resulted in several falls with bruising and facial scrapes. He was discharged home yesterday from hospital with HHPT and 24 hour care for safety. He will be discharged from pulmonary rehab and readmitted at a later time once he has recovered from current illness and deconditioning.   First day of exercise today, did well, too early to see progression toward outcomes/goals  Pt has completed 4 exercise sessions.  Pt with fair attendance and pt returns after discharge from pulmonary rehab earlier this year.  Pt with slight weight loss of .3kg since starting on 7/2. Pt reprorts his shortness of breath has not improved.  Hopefully with consistent attendance during the next 30 days, will be able to see some progress.  Pt with bp and lipids within normal limits will resolve these two patietnt goalls.   Expected Outcomes  see admission goals  see  admission goals  -  see admission goals  see admission goals   Row Name 04/23/18 1153 05/20/18 1653 05/21/18 0934         Core Components/Risk Factors/Patient Goals Review   Personal Goals Review  -  Weight Management/Obesity;Improve shortness of breath with ADL's;Heart Failure;Develop more efficient breathing techniques such as purse lipped breathing and diaphragmatic breathing and practicing self-pacing with activity.;Increase knowledge of respiratory medications and ability to use respiratory devices properly.  -     Review  Pt has completed 4 exercise sessions.  Pt with fair attendance and pt returns after discharge from pulmonary rehab earlier this year.  Pt with slight weight loss of .3kg since starting on 7/2. Pt reprorts his shortness of breath has not improved.  Hopefully with consistent attendance during the next 30 days, will be able to see some progress. Presently pt is on level 4 for nustep and sci fit bike, averages 9-11 laps on the track.  Pt with bp and lipids within normal limits will resolve these two patietnt goalls.  Pt has completed 11 exercise sessions.  Pt with fair attendance.  Pt with slight weight loss of .2kg since starting on 7/2. Pt does  not routinely weigh himself at home, he goes on how he feels. Presently pt remains on  level 4 for nustep and sci fit bike level 5, averages 10-13 laps on the track.. Continue to monitor pt progress during the next 30 days.  -     Expected Outcomes  see admission goals/outcomes  see admission goals/outcomes  See Admission Goals/Outcomes        Core Components/Risk Factors/Patient Goals at Discharge (Final Review):  Goals and Risk Factor Review - 05/21/18 0934      Core Components/Risk Factors/Patient Goals Review   Expected Outcomes  See Admission Goals/Outcomes       ITP Comments: ITP Comments    Row Name 03/21/18 0956 04/21/18 1627 05/21/18 0933       ITP Comments  Dr. Jennet Maduro, Medical Director  Dr. Jennet Maduro, Medical  Director  Dr. Jennet Maduro, Medical Director        Comments:  Pt completed 11 exercise sessions since 7/2. Troy [name], BSN Cardiac and Training and development officer

## 2018-05-20 NOTE — Progress Notes (Signed)
Daily Session Note  Patient Details  Name: Troy Pol [name] MRN: [id number] Date of Birth: 06/10/1941 Referring Provider:     Pulmonary Rehab Walk Test from 03/25/2018 in [facility]  Referring Provider  Dr. Wert      Encounter Date: 05/20/2018  Check In: Session Check In - 05/20/18 1235      Check-In   Supervising physician immediately available to respond to emergencies  [facility] immediately available    Physician(s)  Dr. Powell    Location  [facility]    Staff Present   , MS, ACSM RCEP, Exercise Physiologist;Lisa Hughes, RN;Carlette Carlton, RN, BSN;Annedrea Stackhouse, RN, MHA    Medication changes reported      No    Fall or balance concerns reported     No    Tobacco Cessation  No Change    Warm-up and Cool-down  Performed as group-led instruction    Resistance Training Performed  Yes    VAD Patient?  No    PAD/SET Patient?  No      Pain Assessment   Currently in Pain?  No/denies       Capillary Blood Glucose: No results found for this or any previous visit (from the past 24 hour(s)).  Exercise Prescription Changes - 05/20/18 1300      Response to Exercise   Blood Pressure (Admit)  104/60    Blood Pressure (Exercise)  136/70    Blood Pressure (Exit)  102/60    Heart Rate (Admit)  63 bpm    Heart Rate (Exercise)  79 bpm    Heart Rate (Exit)  76 bpm    Oxygen Saturation (Admit)  98 %    Oxygen Saturation (Exercise)  95 %    Oxygen Saturation (Exit)  97 %    Rating of Perceived Exertion (Exercise)  13    Perceived Dyspnea (Exercise)  2    Duration  Progress to 45 minutes of aerobic exercise without signs/symptoms of physical distress    Intensity  THRR unchanged      Progression   Progression  Continue to progress workloads to maintain intensity without signs/symptoms of physical distress.      Resistance Training   Training Prescription  Yes    Weight  blue bands    Reps  10-15    Time  10 Minutes      Interval Training   Interval Training  No      Bike   Level  5    Minutes  17      NuStep   Level  4    SPM  80    Minutes  17    METs  1.9      Track   Laps  10    Minutes  17       Social History   Tobacco Use  Smoking Status Former Smoker  . Packs/day: 0.50  . Years: 50.00  . Pack years: 25.00  . Types: Cigarettes  . Last attempt to quit: 07/29/2011  . Years since quitting: 6.8  Smokeless Tobacco Never Used    Goals Met:  Exercise tolerated well Personal goals reviewed  Goals Unmet:  Not Applicable  Comments: Service time is from 10:30a to 12:30p    Dr. Wesam G. Yacoub is Medical Director for Pulmonary Rehab at [facility]. 

## 2018-05-22 ENCOUNTER — Encounter (HOSPITAL_COMMUNITY)
Admission: RE | Admit: 2018-05-22 | Discharge: 2018-05-22 | Disposition: A | Discharge status: Home / Self Care | Payer: Commercial Managed Care - PPO | Source: Ambulatory Visit | Attending: Internal Medicine | Admitting: Internal Medicine

## 2018-05-22 ENCOUNTER — Encounter (HOSPITAL_COMMUNITY): Payer: Commercial Managed Care - PPO

## 2018-05-22 DIAGNOSIS — J449 Chronic obstructive pulmonary disease, unspecified: Secondary | ICD-10-CM

## 2018-05-22 NOTE — Progress Notes (Signed)
Daily Session Note  Patient Details  Name: Troy Vanderheiden [name] MRN: [id number] Date of Birth: Sep 10, 1941 Referring Provider:     Pulmonary Rehab Walk Test from 03/25/2018 in [place]  Referring Provider  Dr. Melvyn Novas      Encounter Date: 05/22/2018  Check In: Session Check In - 05/22/18 1218      Check-In   Supervising physician immediately available to respond to emergencies  [facility] immediately available    Physician(s)  Dr. Algis Liming    Location  [facility]    Staff Present  Su Hilt, MS, ACSM RCEP, Exercise Physiologist;Breland Trouten Ysidro Evert, RN;Carlette Wilber Oliphant, RN, BSN;Ramon Dredge, RN, MHA    Medication changes reported      No    Fall or balance concerns reported     No    Tobacco Cessation  No Change    Warm-up and Cool-down  Performed as group-led instruction    Resistance Training Performed  Yes    VAD Patient?  No    PAD/SET Patient?  No      Pain Assessment   Currently in Pain?  No/denies    Multiple Pain Sites  No       Capillary Blood Glucose: No results found for this or any previous visit (from the past 24 hour(s)).    Social History   Tobacco Use  Smoking Status Former Smoker  . Packs/day: 0.50  . Years: 50.00  . Pack years: 25.00  . Types: Cigarettes  . Last attempt to quit: 07/29/2011  . Years since quitting: 6.8  Smokeless Tobacco Never Used    Goals Met:  Exercise tolerated well No report of cardiac concerns or symptoms Strength training completed today  Goals Unmet:  Not Applicable  Comments: Service time is from 1030 to 1215    Dr. Rush Farmer is Medical Director for Pulmonary Rehab at [facility]

## 2018-05-23 NOTE — Progress Notes (Signed)
Pulmonary Individual Treatment Plan  Patient Details  Name: Troy River [name] MRN: [id number] Date of Birth: [birth year] Referring Provider:     Pulmonary Rehab Walk Test from 03/25/2018 in [place]  Referring Provider  Dr. Melvyn Novas      Initial Encounter Date:    Pulmonary Rehab Walk Test from 03/25/2018 in [place]  Date  03/25/18      Visit Diagnosis: Chronic obstructive pulmonary disease, unspecified COPD type ([place])  Patient's Home Medications on Admission:   Current Outpatient Medications:  .  acetaminophen (TYLENOL) 325 MG tablet, Take 650 mg every 6 (six) hours as needed by mouth for moderate pain or headache., Disp: , Rfl:  .  amiodarone (PACERONE) 200 MG tablet, TAKE 1/2 TABLET EVERY DAY., Disp: 15 tablet, Rfl: 6 .  bimatoprost (LUMIGAN) 0.01 % SOLN, Place 1 drop at bedtime into both eyes. , Disp: , Rfl:  .  Biotin 10000 MCG TABS, Take 10,000 mcg daily by mouth. , Disp: , Rfl:  .  budesonide-formoterol (SYMBICORT) 160-4.5 MCG/ACT inhaler, Inhale 2 puffs into the lungs 2 (two) times daily., Disp: 1 Inhaler, Rfl: 12 .  CARDURA 4 MG tablet, TAKE (1/2) TABLET DAILY., Disp: 15 tablet, Rfl: 5 .  dorzolamide-timolol (COSOPT) 22.3-6.8 MG/ML ophthalmic solution, Place 2 drops 2 (two) times daily into the right eye. , Disp: , Rfl:  .  ELIQUIS 5 MG TABS tablet, TAKE 1 TABLET BY MOUTH TWICE DAILY., Disp: 60 tablet, Rfl: 2 .  FLUoxetine (PROZAC) 20 MG capsule, TAKE (1) CAPSULE DAILY., Disp: 30 capsule, Rfl: 5 .  fluticasone (FLONASE) 50 MCG/ACT nasal spray, Place 2 sprays into both nostrils daily., Disp: , Rfl: 0 .  furosemide (LASIX) 20 MG tablet, Take 1 tablet (20 mg total) by mouth daily., Disp: 30 tablet, Rfl: 11 .  Hypertonic Nasal Wash (SINUS RINSE KIT NA), Place 1 application into the nose daily as needed (congestion)., Disp: , Rfl:  .  iron polysaccharides (POLY-IRON 150) 150 MG capsule, Take 150 mg by mouth daily., Disp:  , Rfl:  .  Melatonin 5 MG TABS, Take 5 mg by mouth at bedtime., Disp: , Rfl:  .  montelukast (SINGULAIR) 10 MG tablet, Take 1 tablet (10 mg total) by mouth at bedtime., Disp: 30 tablet, Rfl: 5 .  Multiple Vitamin (MULTIVITAMIN WITH MINERALS) TABS tablet, Take 1 tablet by mouth daily. Centrum Silver, Disp: , Rfl:  .  Polyethyl Glycol-Propyl Glycol (SYSTANE OP), Place 1-2 drops as needed into both eyes (for dry eyes)., Disp: , Rfl:  .  potassium chloride SA (K-DUR,KLOR-CON) 20 MEQ tablet, TAKE 1 TABLET ONCE DAILY., Disp: 30 tablet, Rfl: 3 .  ranitidine (ZANTAC) 150 MG tablet, Take 150 mg by mouth daily as needed for heartburn. , Disp: , Rfl:  .  rosuvastatin (CRESTOR) 40 MG tablet, TAKE 1 TABLET ONCE DAILY., Disp: 30 tablet, Rfl: 2  Past Medical History: Past Medical History:  Diagnosis Date  . AAA (abdominal aortic aneurysm) (HCC) LAST ABD. [name]  JAN 2014  3.4CM   MONITORED BY DR [name]  . Arthritis   . Atrial fibrillation ([place])   . Benign essential HTN 01/19/2016  . Bilateral carotid artery stenosis DUPLEX 12-29-2012  BY DR [facility]   BILATERAL ICA STENOSIS 60-79%  . BPH (benign prostatic hypertrophy)   . Cancer ([place]) 01-25-12   LUNG  . Complication of anesthesia    had nausea after carotid artery surgery, states the medicine given to help the nausea,  made it worse  . COPD (chronic obstructive pulmonary disease) ([place])   . Exertional dyspnea   . Frequency of urination   . GERD (gastroesophageal reflux disease)   . Glaucoma BOTH EYES   Dr Gershon Crane  . Glaucoma   . History of basal cell carcinoma excision   . History of lung cancer APRIL 2012  SQUAMOUS CELL---- S/P RIGHT LOWER LOBECTOMY AT [facility] --  NO CHEMORADIATION---  NO RECURRENCE    ONCOLOGIST- DR Tressie Stalker  LOV IN [facility] 10-27-2012  . Hx of adenomatous colonic polyps 2005    X 2; 1 hyperplastic polyp; Dr Olevia Perches  . Hyperlipidemia   . Hypertension   . Impaired fasting glucose 2007   108; A1c5.4%  . Lesion of bladder   . Microhematuria    . Nocturia   . OSA (obstructive sleep apnea) 08/29/2015   Mild with AHI overall 6/hr but severe during REM sleep with AHI 30/hr with oxygen saturations < 88% for 5.5 minutes of total sleep time.    [name] PAD (peripheral artery disease) ([place]) ABI'S  JAN 2014  0.65 ON RIGHT ;  1.04 ON LEFT  . Peripheral vascular disease ([place]) S/P ANGIOPLASTY AND STENTING   FOLLOWED  BY DR [name]  . Spinal stenosis Sept. 2015  . Stroke [facility]) Jul 07, 2013   mini  TIA  . Urgency of urination     Tobacco Use: Social History   Tobacco Use  Smoking Status Former Smoker  . Packs/day: 0.50  . Years: 50.00  . Pack years: 25.00  . Types: Cigarettes  . Last attempt to quit: 07/29/2011  . Years since quitting: 6.8  Smokeless Tobacco Never Used    Labs: Recent Review Flowsheet Data    Labs for ITP Cardiac and Pulmonary Rehab Latest Ref Rng & Units 01/04/2016 11/27/2016 05/03/2017 10/25/2017 11/04/2017   Cholestrol 0 - 200 mg/dL 95(L) 113 - 105 -   LDLCALC 0 - 99 mg/dL 39 39 - 50 -   HDL >40 mg/dL 39(L) 46 - 35(L) -   Trlycerides <150 mg/dL 86 140 - 101 -   Hemoglobin A1c - - - 5.7 - 5.9   TCO2 0 - 100 mmol/L - - - - -      Capillary Blood Glucose: Lab Results  Component Value Date   GLUCAP 97 08/29/2015   GLUCAP 111 (H) 07/07/2013   GLUCAP 112 (H) 12/22/2010     Pulmonary Assessment Scores: Pulmonary Assessment Scores    Row Name 11/28/17 0841 03/25/18 1543 03/27/18 0730     ADL UCSD   ADL Phase  Entry  Entry  Entry   SOB Score total  -  33  -     CAT Score   CAT Score  -  5  -     mMRC Score   mMRC Score  1  -  0      Pulmonary Function Assessment:   Exercise Target Goals: Exercise Program Goal: Individual exercise prescription set using results from initial 6 min walk test and THRR while considering  patient's activity barriers and safety.   Exercise Prescription Goal: Initial exercise prescription builds to 30-45 minutes a day of aerobic activity, 2-3 days per week.  Home exercise  guidelines will be given to patient during program as part of exercise prescription that the participant will acknowledge.  Activity Barriers & Risk Stratification: Activity Barriers & Cardiac Risk Stratification - 03/21/18 1014      Activity Barriers & Cardiac Risk Stratification  Activity Barriers  Shortness of Breath;Balance Concerns;History of Falls       6 Minute Walk: 6 Minute Walk    Row Name 11/28/17 [phone] 03/27/18 0724       6 Minute Walk   Phase  Initial  Initial    Distance  1200 feet  1194 feet    Walk Time  6 minutes  6 minutes    # of Rest Breaks  0  0    MPH  2.27  2.26    METS  3.3  2.76    RPE  13  12    Perceived Dyspnea   3  2    Symptoms  Yes (comment)  Yes (comment)    Comments  8/10 calf pain  4/10 calf pain    Resting HR  59 bpm  57 bpm    Resting BP  124/58  126/69    Resting Oxygen Saturation   94 %  98 %    Exercise Oxygen Saturation  during 6 min walk  92 %  94 %    Max Ex. HR  117 bpm  74 bpm    Max Ex. BP  146/60  192/67    2 Minute Post BP  -  152/78      Interval HR   1 Minute HR  98  63    2 Minute HR  98  73    3 Minute HR  96  71    4 Minute HR  117  74    5 Minute HR  117  73    6 Minute HR  70  70    2 Minute Post HR  59  57    Interval Heart Rate?  Yes  Yes      Interval Oxygen   Interval Oxygen?  Yes  -    Baseline Oxygen Saturation %  94 %  98 %    1 Minute Oxygen Saturation %  94 %  96 %    1 Minute Liters of Oxygen  0 L  0 L    2 Minute Oxygen Saturation %  93 %  95 %    2 Minute Liters of Oxygen  0 L  0 L    3 Minute Oxygen Saturation %  92 %  96 %    3 Minute Liters of Oxygen  0 L  0 L    4 Minute Oxygen Saturation %  93 %  94 %    4 Minute Liters of Oxygen  0 L  0 L    5 Minute Oxygen Saturation %  92 %  95 %    5 Minute Liters of Oxygen  0 L  0 L    6 Minute Oxygen Saturation %  94 %  94 %    6 Minute Liters of Oxygen  0 L  0 L    2 Minute Post Oxygen Saturation %  97 %  98 %    2 Minute Post Liters of Oxygen  0  L  0 L       Oxygen Initial Assessment: Oxygen Initial Assessment - 03/27/18 0724      Initial 6 min Walk   Oxygen Used  None      Program Oxygen Prescription   Program Oxygen Prescription  None       Oxygen Re-Evaluation: Oxygen Re-Evaluation    Row Name 01/03/18 1025 04/21/18 0943 05/19/18 1353  Program Oxygen Prescription   Program Oxygen Prescription  None  None  None       Home Oxygen   Home Oxygen Device  None  None  None     Sleep Oxygen Prescription  CPAP  CPAP  CPAP     Liters per minute  -  0  0     Home Exercise Oxygen Prescription  None  None  None     Home at Rest Exercise Oxygen Prescription  None  None  None     Compliance with Home Oxygen Use  Yes  Yes  Yes       Goals/Expected Outcomes   Short Term Goals  -  To learn and understand importance of maintaining oxygen saturations>88%;To learn and demonstrate proper use of respiratory medications;To learn and understand importance of monitoring SPO2 with pulse oximeter and demonstrate accurate use of the pulse oximeter.;To learn and demonstrate proper pursed lip breathing techniques or other breathing techniques.  To learn and understand importance of maintaining oxygen saturations>88%;To learn and demonstrate proper use of respiratory medications;To learn and understand importance of monitoring SPO2 with pulse oximeter and demonstrate accurate use of the pulse oximeter.;To learn and demonstrate proper pursed lip breathing techniques or other breathing techniques.     Long  Term Goals  -  Verbalizes importance of monitoring SPO2 with pulse oximeter and return demonstration;Maintenance of O2 saturations>88%;Exhibits proper breathing techniques, such as pursed lip breathing or other method taught during program session;Compliance with respiratory medication;Demonstrates proper use of MDI's  Verbalizes importance of monitoring SPO2 with pulse oximeter and return demonstration;Maintenance of O2  saturations>88%;Exhibits proper breathing techniques, such as pursed lip breathing or other method taught during program session;Compliance with respiratory medication;Demonstrates proper use of MDI's     Goals/Expected Outcomes  Cont. to be compliant with CPAP  Cont. to be compliant with CPAP  Cont. to be compliant with CPAP        Oxygen Discharge (Final Oxygen Re-Evaluation): Oxygen Re-Evaluation - 05/19/18 1353      Program Oxygen Prescription   Program Oxygen Prescription  None      Home Oxygen   Home Oxygen Device  None    Sleep Oxygen Prescription  CPAP    Liters per minute  0    Home Exercise Oxygen Prescription  None    Home at Rest Exercise Oxygen Prescription  None    Compliance with Home Oxygen Use  Yes      Goals/Expected Outcomes   Short Term Goals  To learn and understand importance of maintaining oxygen saturations>88%;To learn and demonstrate proper use of respiratory medications;To learn and understand importance of monitoring SPO2 with pulse oximeter and demonstrate accurate use of the pulse oximeter.;To learn and demonstrate proper pursed lip breathing techniques or other breathing techniques.    Long  Term Goals  Verbalizes importance of monitoring SPO2 with pulse oximeter and return demonstration;Maintenance of O2 saturations>88%;Exhibits proper breathing techniques, such as pursed lip breathing or other method taught during program session;Compliance with respiratory medication;Demonstrates proper use of MDI's    Goals/Expected Outcomes  Cont. to be compliant with CPAP       Initial Exercise Prescription: Initial Exercise Prescription - 03/27/18 0700      Date of Initial Exercise RX and Referring Provider   Date  03/25/18    Referring Provider  Dr. Melvyn Novas      Bike   Level  0.6    Minutes  17      NuStep  Level  4    SPM  80    Minutes  17    METs  1.7      Track   Laps  10    Minutes  17      Prescription Details   Frequency (times per week)  2     Duration  Progress to 45 minutes of aerobic exercise without signs/symptoms of physical distress      Intensity   THRR 40-80% of Max Heartrate  57-114    Ratings of Perceived Exertion  11-13    Perceived Dyspnea  0-4      Progression   Progression  Continue progressive overload as per policy without signs/symptoms or physical distress.      Resistance Training   Training Prescription  Yes    Weight  blue bands    Reps  10-15       Perform Capillary Blood Glucose checks as needed.  Exercise Prescription Changes: Exercise Prescription Changes    Row Name 12/03/17 1100 12/17/17 1300 12/19/17 1300 12/31/17 1200 01/14/18 1220     Response to Exercise   Blood Pressure (Admit)  146/80  -  134/70  150/66  134/60   Blood Pressure (Exercise)  158/60  -  132/58  118/60  148/56   Blood Pressure (Exit)  130/58  -  124/60  130/70  120/62   Heart Rate (Admit)  54 bpm  -  60 bpm  53 bpm  63 bpm   Heart Rate (Exercise)  64 bpm  -  54 bpm  60 bpm  62 bpm   Heart Rate (Exit)  48 bpm  -  59 bpm  49 bpm  57 bpm   Oxygen Saturation (Admit)  97 %  -  95 %  97 %  94 %   Oxygen Saturation (Exercise)  95 %  -  94 %  94 %  93 %   Oxygen Saturation (Exit)  98 %  -  94 %  97 %  97 %   Rating of Perceived Exertion (Exercise)  13  -  _0 Perceived Dyspnea (Exercise)  2  -  _1 Duration  Progress to 45 minutes of aerobic exercise without signs/symptoms of physical distress  -  Continue with 45 min of aerobic exercise without signs/symptoms of physical distress.  Continue with 45 min of aerobic exercise without signs/symptoms of physical distress.  Continue with 45 min of aerobic exercise without signs/symptoms of physical distress.   Intensity  Other (comment)  -  THRR unchanged  THRR unchanged  THRR unchanged     Progression   Progression  - 40-80% HRR  -  Continue to progress workloads to maintain intensity without signs/symptoms of physical distress.  Continue to progress workloads to  maintain intensity without signs/symptoms of physical distress.  Continue to progress workloads to maintain intensity without signs/symptoms of physical distress.     Resistance Training   Training Prescription  Yes  -  Yes  Yes  Yes   Weight  BLUE BANDS  -  Blue bands  Blue bands  Blue bands   Reps  10-15  -  10-15  10-15  10-15   Time  10 Minutes  -  10 Minutes  10 Minutes  10 Minutes     Bike   Level  0.6  -  _2 Minutes  17  -  _0 NuStep   Level  2  -  _1 SPM  80  -  80  80  80   Minutes  17  -  _2 METs  1.6  -  1.8  1.8  1.9     Track   Laps  10  -  _3 Minutes  17  -  _4 Home Exercise Plan   Plans to continue exercise at  -  Home (comment)  -  -  -   Frequency  -  Add 2 additional days to program exercise sessions.  -  -  -   Row Name 04/01/18 1200 04/08/18 1400 04/22/18 1200 05/08/18 0700 05/20/18 1300     Response to Exercise   Blood Pressure (Admit)  -  124/52  122/66  94/62  104/60   Blood Pressure (Exercise)  -  128/60  140/60  144/58  136/70   Blood Pressure (Exit)  -  120/68  110/60  112/56  102/60   Heart Rate (Admit)  -  49 bpm  81 bpm  50 bpm  63 bpm   Heart Rate (Exercise)  -  56 bpm  88 bpm  68 bpm  79 bpm   Heart Rate (Exit)  -  51 bpm  81 bpm  84 bpm  76 bpm   Oxygen Saturation (Admit)  -  98 %  97 %  98 %  98 %   Oxygen Saturation (Exercise)  -  95 %  99 %  95 %  95 %   Oxygen Saturation (Exit)  -  95 %  99 %  97 %  97 %   Rating of Perceived Exertion (Exercise)  -  _5 Perceived Dyspnea (Exercise)  -  _6 Duration  -  Progress to 45 minutes of aerobic exercise without signs/symptoms of physical distress  Progress to 45 minutes of aerobic exercise without signs/symptoms of physical distress  Progress to 45 minutes of aerobic exercise without signs/symptoms of physical distress  Progress to 45 minutes of aerobic exercise without signs/symptoms of physical distress   Intensity  -   THRR unchanged  THRR unchanged  THRR unchanged  THRR unchanged     Progression   Progression  -  Continue to progress workloads to maintain intensity without signs/symptoms of physical distress.  Continue to progress workloads to maintain intensity without signs/symptoms of physical distress.  Continue to progress workloads to maintain intensity without signs/symptoms of physical distress.  Continue to progress workloads to maintain intensity without signs/symptoms of physical distress.     Resistance Training   Training Prescription  -  Yes  Yes  Yes  Yes   Weight  -  blue bands  blue bands  blue bands  blue bands   Reps  -  10-15  10-15  10-15  10-15   Time  -  10 Minutes  10 Minutes  10 Minutes  10 Minutes     Interval Training   Interval Training  -  No  No  No  No     Bike   Level  -  _7 Minutes  -  17  _0 NuStep   Level  -  _1 SPM  -  80  80  80  80   Minutes  -  _2 METs  -  1.6  1.9  2  1.9     Track   Laps  -  _3 Minutes  -  _4 Home Exercise Plan   Plans to continue exercise at  Home (comment)  -  -  -  -   Frequency  Add 2 additional days to program exercise sessions.  -  -  -  -      Exercise Comments: Exercise Comments    Row Name 12/17/17 1308 02/10/18 1425 04/01/18 1243       Exercise Comments  Home exercise completed  Patient is now d/c from program due to hospitalization  Home exercise completed        Exercise Goals and Review: Exercise Goals    Row Name 03/21/18 1017             Exercise Goals   Increase Physical Activity  Yes       Intervention  Provide advice, education, support and counseling about physical activity/exercise needs.;Develop an individualized exercise prescription for aerobic and resistive training based on initial evaluation findings, risk stratification, comorbidities and participant's personal goals.       Expected Outcomes  Short Term: Attend rehab on  a regular basis to increase amount of physical activity.;Long Term: Add in home exercise to make exercise part of routine and to increase amount of physical activity.;Long Term: Exercising regularly at least 3-5 days a week.       Increase Strength and Stamina  Yes       Intervention  Provide advice, education, support and counseling about physical activity/exercise needs.;Develop an individualized exercise prescription for aerobic and resistive training based on initial evaluation findings, risk stratification, comorbidities and participant's personal goals.       Expected Outcomes  Short Term: Increase workloads from initial exercise prescription for resistance, speed, and METs.;Short Term: Perform resistance training exercises routinely during rehab and add in resistance training at home;Long Term: Improve cardiorespiratory fitness, muscular endurance and strength as measured by increased METs and functional capacity (6MWT)       Able to understand and use rate of perceived exertion (RPE) scale  Yes       Intervention  Provide education and explanation on how to use RPE scale       Expected Outcomes  Short Term: Able to use RPE daily in rehab to express subjective intensity level;Long Term:  Able to use RPE to guide intensity level when exercising independently       Able to understand and use Dyspnea scale  Yes       Intervention  Provide education and explanation on how to use Dyspnea scale       Expected Outcomes  Short Term: Able to use Dyspnea scale daily in rehab to express subjective sense of shortness of breath during exertion;Long Term: Able to use Dyspnea scale to guide intensity level when exercising independently       Knowledge and understanding of Target Heart Rate Range (THRR)  Yes       Intervention  Provide education and explanation of THRR including  how the numbers were predicted and where they are located for reference       Expected Outcomes  Short Term: Able to state/look up  THRR;Long Term: Able to use THRR to govern intensity when exercising independently;Short Term: Able to use daily as guideline for intensity in rehab       Understanding of Exercise Prescription  Yes       Intervention  Provide education, explanation, and written materials on patient's individual exercise prescription       Expected Outcomes  Short Term: Able to explain program exercise prescription;Long Term: Able to explain home exercise prescription to exercise independently          Exercise Goals Re-Evaluation : Exercise Goals Re-Evaluation    Row Name 12/09/17 0736 01/03/18 1026 03/28/18 1054 04/21/18 0956 05/19/18 1353     Exercise Goal Re-Evaluation   Exercise Goals Review  Increase Strength and Stamina;Able to understand and use Dyspnea scale;Increase Physical Activity;Able to understand and use rate of perceived exertion (RPE) scale;Knowledge and understanding of Target Heart Rate Range (THRR);Understanding of Exercise Prescription  Increase Strength and Stamina;Able to understand and use Dyspnea scale;Increase Physical Activity;Able to understand and use rate of perceived exertion (RPE) scale;Knowledge and understanding of Target Heart Rate Range (THRR);Understanding of Exercise Prescription  Increase Strength and Stamina;Increase Physical Activity;Able to understand and use rate of perceived exertion (RPE) scale;Knowledge and understanding of Target Heart Rate Range (THRR);Understanding of Exercise Prescription;Able to understand and use Dyspnea scale;Able to check pulse independently  Increase Strength and Stamina;Increase Physical Activity;Able to understand and use rate of perceived exertion (RPE) scale;Knowledge and understanding of Target Heart Rate Range (THRR);Understanding of Exercise Prescription;Able to understand and use Dyspnea scale  Increase Strength and Stamina;Increase Physical Activity;Able to understand and use rate of perceived exertion (RPE) scale;Knowledge and  understanding of Target Heart Rate Range (THRR);Understanding of Exercise Prescription;Able to understand and use Dyspnea scale   Comments  Patient has only attended two rehab sessions. Will cont. to monitor and motivate as able.  PAD is a barrier for this patient. He is progressing wel and has started to exercise at home. Patient is able to walk up to 10 laps (200 ft) in 15 minutes. This includes breaks due to his leg pain. MET level still places him in a "low" functional level. Will cont to monitor patient and progress as able.   Patient will restart Pulmonary Rehab on 04/01/18. Will monitor and motivate as tolerated.   Patient has only completed 4 rehab sessions. Will cont. to monitor and progress as able.  Patient is able to walk 13 laps (200 ft each) in 15 minutes. Patient is open to new workloads but is limited by his claudication pain. MET average places him in a low level. Will cont. to monitor and progress as able.   Expected Outcomes  Through exercise at rehab, patient will increase strength and stamina and gain enough confidence to start an exercise regime at home.   Through exercise at rehab, patient will increase strength and stamina and gain enough confidence to start an exercise regime at home.   Through exercise at rehab and at home, the patient will decrease shortness of breath with daily activities and feel confident in carrying out an exercise regime at home.   Through exercise at rehab and at home, the patient will decrease shortness of breath with daily activities and feel confident in carrying out an exercise regime at home.   Through exercise at rehab and at home, the  patient will decrease shortness of breath with daily activities and feel confident in carrying out an exercise regime at home.       Discharge Exercise Prescription (Final Exercise Prescription Changes): Exercise Prescription Changes - 05/20/18 1300      Response to Exercise   Blood Pressure (Admit)  104/60    Blood  Pressure (Exercise)  136/70    Blood Pressure (Exit)  102/60    Heart Rate (Admit)  63 bpm    Heart Rate (Exercise)  79 bpm    Heart Rate (Exit)  76 bpm    Oxygen Saturation (Admit)  98 %    Oxygen Saturation (Exercise)  95 %    Oxygen Saturation (Exit)  97 %    Rating of Perceived Exertion (Exercise)  13    Perceived Dyspnea (Exercise)  2    Duration  Progress to 45 minutes of aerobic exercise without signs/symptoms of physical distress    Intensity  THRR unchanged      Progression   Progression  Continue to progress workloads to maintain intensity without signs/symptoms of physical distress.      Resistance Training   Training Prescription  Yes    Weight  blue bands    Reps  10-15    Time  10 Minutes      Interval Training   Interval Training  No      Bike   Level  5    Minutes  17      NuStep   Level  4    SPM  80    Minutes  17    METs  1.9      Track   Laps  10    Minutes  17       Nutrition:  Target Goals: Understanding of nutrition guidelines, daily intake of sodium <1538m, cholesterol <2055m calories 30% from fat and 7% or less from saturated fats, daily to have 5 or more servings of fruits and vegetables.  Biometrics:    Nutrition Therapy Plan and Nutrition Goals: Nutrition Therapy & Goals - 04/08/18 1147      Nutrition Therapy   Diet  Heart Healthy      Personal Nutrition Goals   Nutrition Goal  Pt to identify and limit food sources of sodium.    Personal Goal #2  Identify food quantities necessary to achieve wt loss of  -2# per week to a goal wt loss of 6-24 lb at graduation from pulmonary rehab.      Intervention Plan   Intervention  Prescribe, educate and counsel regarding individualized specific dietary modifications aiming towards targeted core components such as weight, hypertension, lipid management, diabetes, heart failure and other comorbidities.    Expected Outcomes  Short Term Goal: Understand basic principles of dietary content,  such as calories, fat, sodium, cholesterol and nutrients.;Long Term Goal: Adherence to prescribed nutrition plan.       Nutrition Assessments: Nutrition Assessments - 02/20/18 1227      Rate Your Plate Scores   Pre Score  42       Nutrition Goals Re-Evaluation: Nutrition Goals Re-Evaluation    [employer] 04/08/18 1148             Goals   Nutrition Goal  Pt to identify and limit food sources of sodium.       Comment  Identify food quantities necessary to achieve wt loss of  -2# per week to a goal wt loss of 6-24 lb at graduation  from pulmonary rehab.          Nutrition Goals Discharge (Final Nutrition Goals Re-Evaluation): Nutrition Goals Re-Evaluation - 04/08/18 1148      Goals   Nutrition Goal  Pt to identify and limit food sources of sodium.    Comment  Identify food quantities necessary to achieve wt loss of  -2# per week to a goal wt loss of 6-24 lb at graduation from pulmonary rehab.       Psychosocial: Target Goals: Acknowledge presence or absence of significant depression and/or stress, maximize coping skills, provide positive support system. Participant is able to verbalize types and ability to use techniques and skills needed for reducing stress and depression.  Initial Review & Psychosocial Screening: Initial Psych Review & Screening - 03/21/18 1010      Initial Review   Current issues with  None Identified;History of Depression   diminshed vision caused change in activity level and hobbies     [place]?  Yes      Barriers   Psychosocial barriers to participate in program  There are no identifiable barriers or psychosocial needs.      Screening Interventions   Interventions  Encouraged to exercise    Expected Outcomes  Long Term goal: The participant improves quality of Life and PHQ9 Scores as seen by post scores and/or verbalization of changes;Short Term goal: Identification and review with participant of any Quality of Life  or Depression concerns found by scoring the questionnaire.       Quality of Life Scores:  Scores of 19 and below usually indicate a poorer quality of life in these areas.  A difference of  2-3 points is a clinically meaningful difference.  A difference of 2-3 points in the total score of the Quality of Life Index has been associated with significant improvement in overall quality of life, self-image, physical symptoms, and general health in studies assessing change in quality of life.  PHQ-9: Recent Review Flowsheet Data    Depression screen [facility] 2/9 03/21/2018 11/18/2017 11/04/2017 01/12/2015   Decreased Interest 0 0 0 0   Down, Depressed, Hopeless 0 0 0 0   PHQ - 2 Score 0 0 0 0   Altered sleeping 0 - - -   Tired, decreased energy 1 - - -   Change in appetite 0 - - -   Trouble concentrating 0 - - -   Moving slowly or fidgety/restless 0 - - -   Suicidal thoughts 0 - - -   PHQ-9 Score 1 - - -   Difficult doing work/chores Not difficult at all - - -     Interpretation of Total Score  Total Score Depression Severity:  1-4 = Minimal depression, 5-9 = Mild depression, 10-14 = Moderate depression, 15-19 = Moderately severe depression, 20-27 = Severe depression   Psychosocial Evaluation and Intervention: Psychosocial Evaluation - 05/20/18 1653      Psychosocial Evaluation & Interventions   Interventions  Encouraged to exercise with the program and follow exercise prescription    Comments  no psychosocial issues identified    Expected Outcomes  no barriers to participation in pulmonary rehab    Continue Psychosocial Services   Follow up required by staff       Psychosocial Re-Evaluation: Psychosocial Re-Evaluation    Row Name 12/03/17 1238 01/06/18 1010 02/03/18 1029 04/01/18 1313 04/21/18 1635     Psychosocial Re-Evaluation   Current issues with  None Identified  None  Identified  None Identified  None Identified  None Identified   Comments  -  -  None identified at last face to face  encounter during exercise session.  none identified  none identified   Expected Outcomes  no barriers to participation in pulmonary rehab  no barriers to participation in pulmonary rehab  -  -  no barriers to participation in pulmonary rehab   Interventions  Encouraged to attend Pulmonary Rehabilitation for the exercise  Encouraged to attend Pulmonary Rehabilitation for the exercise  -  Encouraged to attend Pulmonary Rehabilitation for the exercise  Relaxation education   Continue Psychosocial Services   No Follow up required  Follow up required by staff  -  No Follow up required  Follow up required by staff   [place] Name 05/20/18 1653             Psychosocial Re-Evaluation   Current issues with  None Identified       Comments  none identified       Expected Outcomes  no barriers to participation in pulmonary rehab       Interventions  Relaxation education;Stress management education;Encouraged to attend Pulmonary Rehabilitation for the exercise       Continue Psychosocial Services   Follow up required by staff          Psychosocial Discharge (Final Psychosocial Re-Evaluation): Psychosocial Re-Evaluation - 05/20/18 1653      Psychosocial Re-Evaluation   Current issues with  None Identified    Comments  none identified    Expected Outcomes  no barriers to participation in pulmonary rehab    Interventions  Relaxation education;Stress management education;Encouraged to attend Pulmonary Rehabilitation for the exercise    Continue Psychosocial Services   Follow up required by staff       Education: Education Goals: Education classes will be provided on a weekly basis, covering required topics. Participant will state understanding/return demonstration of topics presented.  Learning Barriers/Preferences: Learning Barriers/Preferences - 03/21/18 1012      Learning Barriers/Preferences   Learning Barriers  Sight;Hearing   has aids for hearing but does not use them   Learning Preferences   Individual Instruction;Skilled Demonstration;Verbal Instruction       Education Topics: Risk Factor Reduction:  -Group instruction that is supported by a PowerPoint presentation. Instructor discusses the definition of a risk factor, different risk factors for pulmonary disease, and how the heart and lungs work together.     Nutrition for Pulmonary Patient:  -Group instruction provided by PowerPoint slides, verbal discussion, and written materials to support subject matter. The instructor gives an explanation and review of healthy diet recommendations, which includes a discussion on weight management, recommendations for fruit and vegetable consumption, as well as protein, fluid, caffeine, fiber, sodium, sugar, and alcohol. Tips for eating when patients are short of breath are discussed.   PULMONARY REHAB CHRONIC OBSTRUCTIVE PULMONARY DISEASE from 05/15/2018 in [place]  Date  12/19/17  Educator  Parke Simmers  Instruction Review Code  2- Demonstrated Understanding      Pursed Lip Breathing:  -Group instruction that is supported by demonstration and informational handouts. Instructor discusses the benefits of pursed lip and diaphragmatic breathing and detailed demonstration on how to preform both.     Oxygen Safety:  -Group instruction provided by PowerPoint, verbal discussion, and written material to support subject matter. There is an overview of "What is Oxygen" and "Why do we need it".  Instructor also reviews how to  create a safe environment for oxygen use, the importance of using oxygen as prescribed, and the risks of noncompliance. There is a brief discussion on traveling with oxygen and resources the patient may utilize.   Oxygen Equipment:  -Group instruction provided by [facility] Staff utilizing handouts, written materials, and equipment demonstrations.   PULMONARY REHAB CHRONIC OBSTRUCTIVE PULMONARY DISEASE from 05/15/2018 in [place]  Date  04/24/18  Educator  Ace Gins  Instruction Review Code  2- Demonstrated Understanding      Signs and Symptoms:  -Group instruction provided by written material and verbal discussion to support subject matter. Warning signs and symptoms of infection, stroke, and heart attack are reviewed and when to call the physician/911 reinforced. Tips for preventing the spread of infection discussed.   PULMONARY REHAB CHRONIC OBSTRUCTIVE PULMONARY DISEASE from 05/15/2018 in [place]  Date  04/10/18  Educator  Remo Lipps  Instruction Review Code  1- Verbalizes Understanding      Advanced Directives:  -Group instruction provided by verbal instruction and written material to support subject matter. Instructor reviews Advanced Directive laws and proper instruction for filling out document.   Pulmonary Video:  -Group video education that reviews the importance of medication and oxygen compliance, exercise, good nutrition, pulmonary hygiene, and pursed lip and diaphragmatic breathing for the pulmonary patient.   Exercise for the Pulmonary Patient:  -Group instruction that is supported by a PowerPoint presentation. Instructor discusses benefits of exercise, core components of exercise, frequency, duration, and intensity of an exercise routine, importance of utilizing pulse oximetry during exercise, safety while exercising, and options of places to exercise outside of rehab.     Pulmonary Medications:  -Verbally interactive group education provided by instructor with focus on inhaled medications and proper administration.   PULMONARY REHAB CHRONIC OBSTRUCTIVE PULMONARY DISEASE from 05/15/2018 in [place]  Date  12/26/17  Educator  pharm  Instruction Review Code  2- Demonstrated Understanding      Anatomy and Physiology of the Respiratory System and Intimacy:  -Group instruction provided by PowerPoint, verbal discussion,  and written material to support subject matter. Instructor reviews respiratory cycle and anatomical components of the respiratory system and their functions. Instructor also reviews differences in obstructive and restrictive respiratory diseases with examples of each. Intimacy, Sex, and Sexuality differences are reviewed with a discussion on how relationships can change when diagnosed with pulmonary disease. Common sexual concerns are reviewed.   MD DAY -A group question and answer session with a medical doctor that allows participants to ask questions that relate to their pulmonary disease state.   PULMONARY REHAB CHRONIC OBSTRUCTIVE PULMONARY DISEASE from 05/15/2018 in [place]  Date  01/09/18  Educator  Dr. Nelda Marseille  Instruction Review Code  1- Verbalizes Understanding      OTHER EDUCATION -Group or individual verbal, written, or video instructions that support the educational goals of the pulmonary rehab program.   PULMONARY REHAB CHRONIC OBSTRUCTIVE PULMONARY DISEASE from 05/15/2018 in [place]  Date  01/02/18 Rodney Booze a sedentary lifestyle]  Educator  EP  Instruction Review Code  1- Verbalizes Understanding      Holiday Eating Survival Tips:  -Group instruction provided by PowerPoint slides, verbal discussion, and written materials to support subject matter. The instructor gives patients tips, tricks, and techniques to help them not only survive but enjoy the holidays despite the onslaught of food that accompanies the holidays.  Knowledge Questionnaire Score: Knowledge Questionnaire Score - 03/25/18 1543      Knowledge Questionnaire Score   Pre Score  16/18       Core Components/Risk Factors/Patient Goals at Admission: Personal Goals and Risk Factors at Admission - 03/21/18 1007      Core Components/Risk Factors/Patient Goals on Admission    Weight Management  Weight Loss;Yes    Intervention  Weight Management:  Provide education and appropriate resources to help participant work on and attain dietary goals.;Weight Management: Develop a combined nutrition and exercise program designed to reach desired caloric intake, while maintaining appropriate intake of nutrient and fiber, sodium and fats, and appropriate energy expenditure required for the weight goal.;Weight Management/Obesity: Establish reasonable short term and long term weight goals.;Obesity: Provide education and appropriate resources to help participant work on and attain dietary goals.    Admit Weight  202 lb 6.1 oz (91.8 kg)    Goal Weight: Short Term  195 lb (88.5 kg)    Goal Weight: Long Term  190 lb (86.2 kg)    Expected Outcomes  Short Term: Continue to assess and modify interventions until short term weight is achieved;Long Term: Adherence to nutrition and physical activity/exercise program aimed toward attainment of established weight goal;Weight Loss: Understanding of general recommendations for a balanced deficit meal plan, which promotes 1-2 lb weight loss per week and includes a negative energy balance of [phone] kcal/d;Understanding recommendations for meals to include 15-35% energy as protein, 25-35% energy from fat, 35-60% energy from carbohydrates, less than '200mg'$  of dietary cholesterol, 20-35 gm of total fiber daily;Understanding of distribution of calorie intake throughout the day with the consumption of 4-5 meals/snacks    Improve shortness of breath with ADL's  Yes    Intervention  Provide education, individualized exercise plan and daily activity instruction to help decrease symptoms of SOB with activities of daily living.    Expected Outcomes  Short Term: Improve cardiorespiratory fitness to achieve a reduction of symptoms when performing ADLs;Long Term: Be able to perform more ADLs without symptoms or delay the onset of symptoms    Heart Failure  Yes    Intervention  Provide a combined exercise and nutrition program that is  supplemented with education, support and counseling about heart failure. Directed toward relieving symptoms such as shortness of breath, decreased exercise tolerance, and extremity edema.    Expected Outcomes  Improve functional capacity of life;Short term: Attendance in program 2-3 days a week with increased exercise capacity. Reported lower sodium intake. Reported increased fruit and vegetable intake. Reports medication compliance.;Long term: Adoption of self-care skills and reduction of barriers for early signs and symptoms recognition and intervention leading to self-care maintenance.    Hypertension  Yes    Intervention  Provide education on lifestyle modifcations including regular physical activity/exercise, weight management, moderate sodium restriction and increased consumption of fresh fruit, vegetables, and low fat dairy, alcohol moderation, and smoking cessation.;Monitor prescription use compliance.    Expected Outcomes  Short Term: Continued assessment and intervention until BP is < 140/75m HG in hypertensive participants. < 130/818mHG in hypertensive participants with diabetes, heart failure or chronic kidney disease.;Long Term: Maintenance of blood pressure at goal levels.    Lipids  Yes    Intervention  Provide education and support for participant on nutrition & aerobic/resistive exercise along with prescribed medications to achieve LDL '70mg'$ , HDL >'40mg'$ .    Expected Outcomes  Short Term: Participant states understanding of desired cholesterol values and is compliant with medications prescribed.  Participant is following exercise prescription and nutrition guidelines.;Long Term: Cholesterol controlled with medications as prescribed, with individualized exercise RX and with personalized nutrition plan. Value goals: LDL < '70mg'$ , HDL > 40 mg.       Core Components/Risk Factors/Patient Goals Review:  Goals and Risk Factor Review    Row Name 12/03/17 1235 01/06/18 1009 02/03/18 1026 04/01/18  1311 04/21/18 1631     Core Components/Risk Factors/Patient Goals Review   Personal Goals Review  Improve shortness of breath with ADL's  Improve shortness of breath with ADL's  Improve shortness of breath with ADL's  Weight Management/Obesity;Improve shortness of breath with ADL's;Heart Failure;Hypertension;Lipids  Weight Management/Obesity;Improve shortness of breath with ADL's;Heart Failure   Review  Has just begun program, has exercised once in program, tolerated it well.  Too early to see progression toward goals  patient is doing well in the pulmonary rehab program. he states he can see a significant improvement in his shortness of breath at home with ADLs and with exercise. he has even started exercising outside of rehab focusing not only on aerobic exercise but stregnth training as well.  patient was doing well in pulmonary rehab until last week when he was admitted to the hospital for multiple falls, dehydration, and URI. He was previously seen by MD for URI and prescribed prednisone with antibiotics. His health continued to decline and he became dehydrated which resulted in several falls with bruising and facial scrapes. He was discharged home yesterday from hospital with HHPT and 24 hour care for safety. He will be discharged from pulmonary rehab and readmitted at a later time once he has recovered from current illness and deconditioning.   First day of exercise today, did well, too early to see progression toward outcomes/goals  Pt has completed 4 exercise sessions.  Pt with fair attendance and pt returns after discharge from pulmonary rehab earlier this year.  Pt with slight weight loss of .3kg since starting on 7/2. Pt reprorts his shortness of breath has not improved.  Hopefully with consistent attendance during the next 30 days, will be able to see some progress.  Pt with bp and lipids within normal limits will resolve these two patietnt goalls.   Expected Outcomes  see admission goals  see  admission goals  -  see admission goals  see admission goals   Row Name 04/23/18 1153 05/20/18 1653 05/21/18 0934         Core Components/Risk Factors/Patient Goals Review   Personal Goals Review  -  Weight Management/Obesity;Improve shortness of breath with ADL's;Heart Failure;Develop more efficient breathing techniques such as purse lipped breathing and diaphragmatic breathing and practicing self-pacing with activity.;Increase knowledge of respiratory medications and ability to use respiratory devices properly.  -     Review  Pt has completed 4 exercise sessions.  Pt with fair attendance and pt returns after discharge from pulmonary rehab earlier this year.  Pt with slight weight loss of .3kg since starting on 7/2. Pt reprorts his shortness of breath has not improved.  Hopefully with consistent attendance during the next 30 days, will be able to see some progress. Presently pt is on level 4 for nustep and sci fit bike, averages 9-11 laps on the track.  Pt with bp and lipids within normal limits will resolve these two patietnt goalls.  Pt has completed 11 exercise sessions.  Pt with fair attendance.  Pt with slight weight loss of .2kg since starting on 7/2. Pt does not routinely weigh himself at home,  he goes on how he feels. Presently pt remains on  level 4 for nustep and sci fit bike level 5, averages 10-13 laps on the track.. Continue to monitor pt progress during the next 30 days.  -     Expected Outcomes  see admission goals/outcomes  see admission goals/outcomes  See Admission Goals/Outcomes        Core Components/Risk Factors/Patient Goals at Discharge (Final Review):  Goals and Risk Factor Review - 05/21/18 0934      Core Components/Risk Factors/Patient Goals Review   Expected Outcomes  See Admission Goals/Outcomes       ITP Comments: ITP Comments    Row Name 03/21/18 0956 04/21/18 1627 05/21/18 0933       ITP Comments  Dr. Jennet Maduro, Medical Director  Dr. Jennet Maduro, Medical  Director  Dr. Jennet Maduro, Medical Director        Comments:  Pt has completed 11 exercise sessions. Cherre Huger, BSN Cardiac and Training and development officer

## 2018-05-26 ENCOUNTER — Telehealth: Payer: Self-pay | Admitting: Cardiology

## 2018-05-26 ENCOUNTER — Encounter (HOSPITAL_COMMUNITY): Payer: Self-pay | Admitting: Cardiology

## 2018-05-26 ENCOUNTER — Other Ambulatory Visit: Payer: Self-pay

## 2018-05-26 ENCOUNTER — Ambulatory Visit (HOSPITAL_COMMUNITY)
Admission: RE | Admit: 2018-05-26 | Discharge: 2018-05-26 | Disposition: A | Discharge status: Home / Self Care | Payer: Commercial Managed Care - PPO | Source: Ambulatory Visit | Attending: Cardiology | Admitting: Cardiology

## 2018-05-26 VITALS — BP 124/58 | HR 67 | Wt 204.0 lb

## 2018-05-26 DIAGNOSIS — Z7901 Long term (current) use of anticoagulants: Secondary | ICD-10-CM | POA: Diagnosis not present

## 2018-05-26 DIAGNOSIS — Z7951 Long term (current) use of inhaled steroids: Secondary | ICD-10-CM | POA: Insufficient documentation

## 2018-05-26 DIAGNOSIS — I480 Paroxysmal atrial fibrillation: Secondary | ICD-10-CM | POA: Diagnosis not present

## 2018-05-26 DIAGNOSIS — I5032 Chronic diastolic (congestive) heart failure: Secondary | ICD-10-CM | POA: Insufficient documentation

## 2018-05-26 DIAGNOSIS — I2510 Atherosclerotic heart disease of native coronary artery without angina pectoris: Secondary | ICD-10-CM | POA: Insufficient documentation

## 2018-05-26 DIAGNOSIS — Z79899 Other long term (current) drug therapy: Secondary | ICD-10-CM | POA: Insufficient documentation

## 2018-05-26 DIAGNOSIS — Z85118 Personal history of other malignant neoplasm of bronchus and lung: Secondary | ICD-10-CM | POA: Diagnosis not present

## 2018-05-26 DIAGNOSIS — Z8673 Personal history of transient ischemic attack (TIA), and cerebral infarction without residual deficits: Secondary | ICD-10-CM | POA: Insufficient documentation

## 2018-05-26 DIAGNOSIS — I739 Peripheral vascular disease, unspecified: Secondary | ICD-10-CM | POA: Insufficient documentation

## 2018-05-26 DIAGNOSIS — E785 Hyperlipidemia, unspecified: Secondary | ICD-10-CM | POA: Diagnosis not present

## 2018-05-26 DIAGNOSIS — I481 Persistent atrial fibrillation: Secondary | ICD-10-CM | POA: Insufficient documentation

## 2018-05-26 DIAGNOSIS — N183 Chronic kidney disease, stage 3 (moderate): Secondary | ICD-10-CM | POA: Diagnosis not present

## 2018-05-26 DIAGNOSIS — Z87891 Personal history of nicotine dependence: Secondary | ICD-10-CM | POA: Insufficient documentation

## 2018-05-26 DIAGNOSIS — I130 Hypertensive heart and chronic kidney disease with heart failure and stage 1 through stage 4 chronic kidney disease, or unspecified chronic kidney disease: Secondary | ICD-10-CM | POA: Diagnosis not present

## 2018-05-26 DIAGNOSIS — I491 Atrial premature depolarization: Secondary | ICD-10-CM | POA: Insufficient documentation

## 2018-05-26 DIAGNOSIS — J449 Chronic obstructive pulmonary disease, unspecified: Secondary | ICD-10-CM | POA: Insufficient documentation

## 2018-05-26 DIAGNOSIS — I714 Abdominal aortic aneurysm, without rupture: Secondary | ICD-10-CM | POA: Diagnosis not present

## 2018-05-26 DIAGNOSIS — G4733 Obstructive sleep apnea (adult) (pediatric): Secondary | ICD-10-CM | POA: Insufficient documentation

## 2018-05-26 DIAGNOSIS — H409 Unspecified glaucoma: Secondary | ICD-10-CM | POA: Diagnosis not present

## 2018-05-26 DIAGNOSIS — J439 Emphysema, unspecified: Secondary | ICD-10-CM | POA: Diagnosis not present

## 2018-05-26 LAB — COMPREHENSIVE METABOLIC PANEL
ALBUMIN: 3.3 g/dL — AB (ref 3.5–5.0)
ALT: 18 U/L (ref 0–44)
AST: 21 U/L (ref 15–41)
Alkaline Phosphatase: 66 U/L (ref 38–126)
Anion gap: 6 (ref 5–15)
BUN: 15 mg/dL (ref 8–23)
CHLORIDE: 111 mmol/L (ref 98–111)
CO2: 26 mmol/L (ref 22–32)
Calcium: 9.4 mg/dL (ref 8.9–10.3)
Creatinine, Ser: 1.5 mg/dL — ABNORMAL HIGH (ref 0.61–1.24)
GFR calc Af Amer: 50 mL/min — ABNORMAL LOW (ref >60)
GFR, EST NON AFRICAN AMERICAN: 43 mL/min — AB (ref >60)
GLUCOSE: 106 mg/dL — AB (ref 70–99)
POTASSIUM: 4.9 mmol/L (ref 3.5–5.1)
SODIUM: 143 mmol/L (ref 135–145)
Total Bilirubin: 0.6 mg/dL (ref 0.3–1.2)
Total Protein: 6.9 g/dL (ref 6.5–8.1)

## 2018-05-26 LAB — CBC
HEMATOCRIT: 38.6 % — AB (ref 39.0–52.0)
Hemoglobin: 11.7 g/dL — ABNORMAL LOW (ref 13.0–17.0)
MCH: 27.7 pg (ref 26.0–34.0)
MCHC: 30.3 g/dL (ref 30.0–36.0)
MCV: 91.5 fL (ref 78.0–100.0)
PLATELETS: 311 10*3/uL (ref 150–400)
RBC: 4.22 MIL/uL (ref 4.22–5.81)
RDW: 13.9 % (ref 11.5–15.5)
WBC: 5.6 10*3/uL (ref 4.0–10.5)

## 2018-05-26 LAB — TSH: TSH: 1.581 u[IU]/mL (ref 0.350–4.500)

## 2018-05-26 NOTE — Telephone Encounter (Signed)
Patient wife called and stated that she had questions about patient atrial fib and his loop recorder. Call routed to [place].

## 2018-05-26 NOTE — Patient Instructions (Signed)
Labs today (will call for abnormal results, otherwise no news is good news)  Follow up in 6 months, please call our clinic in January/February 2020 to schedule your appointment.  [phone], Option 3.

## 2018-05-26 NOTE — Telephone Encounter (Signed)
Spoke with pt's wife who wanted to know when pt was in atrial fib because they were seeing [name] today read off to pts wife dates and times pt was in AF and duration since 8/17 to today.

## 2018-05-27 ENCOUNTER — Encounter (HOSPITAL_COMMUNITY)
Admission: RE | Admit: 2018-05-27 | Discharge: 2018-05-27 | Disposition: A | Discharge status: Home / Self Care | Payer: Commercial Managed Care - PPO | Source: Ambulatory Visit | Attending: Internal Medicine | Admitting: Internal Medicine

## 2018-05-27 DIAGNOSIS — J449 Chronic obstructive pulmonary disease, unspecified: Secondary | ICD-10-CM | POA: Diagnosis not present

## 2018-05-27 NOTE — Progress Notes (Signed)
Patient ID: Troy Kenner [name], male   DOB: 11/16/1940, 77 y.o.   MRN: [id number] PCP: Dr. Quay Burow Cardiology: [name]  77 y.o. with history of HTN, COPD, active smoking/COPD, carotid stenosis s/p right CEA, paroxysmal atrial fibrillation, and PAD returns for followup of CHF and atrial fibrillation.  He does not have known obstructive CAD but is at high risk for CAD based on his comorbidities.  Lexiscan Cardiolite in 3/14 showed no ischemia or infarction and echo in 3/14 showed normal EF.  He had a left fem-pop bypass at the [facility] in the '90s.  He is followed at [facility] for PAD. He has chronic right calf/thigh/buttocks claudication that is unchanged over the last few years and follows regularly at [facility].  He has had right lower lobectomy for lung cancer.  He continues to stay off cigarettes.    At a prior appointment, he was in atrial fibrillation but did not realize it.  I started him on Eliquis and diltiazem CD, and he spontaneously converted to NSR.  In 5/15, he was at [facility] and felt "strange" one day: fatigued, weak, short of breath.  He went to the ER and was in atrial fibrillation with HR in 80s-90s.  He spontaneously converted to NSR in the ER.  He felt back to normal after converting to NSR.  I started him on Multaq 400 mg bid.  With CHF, he was eventually transitioned over to amiodarone.   He had patch angioplasty revision of left fem-pop bypass in 11/16.  Now with minimal claudication.  He follows with [facility].     He had a Cardiolite and echo in 7/17 that were unremarkable.  Repeat Cardiolite in 12/17 showed no ischemia, echo was uninterpretable.  Cardiac MRI was therefore done in 1/18, showing EF 66% with normal-appearing RV, no late gadolinium enhancement.   Given increased exertional dyspnea and a defect on Cardiolite, he had LHC in 11/18.  This showed no obstructive CAD. He had a chest CT done given concern for possible amiodarone lung toxicity with increased dyspnea. This showed emphysema,  no ILD.    In 5/19, he developed a probable viral syndrome with dehydration and had a presyncopal episode.  He was orthostatic in the hospital.  He was noted to be in atrial fibrillation this admission which remained persistent.  He had a TEE-guided DCCV back to NSR.   He returns for followup of diastolic CHF and atrial fibrillation.  He is in NSR today.  He feels like he has been in NSR primarily recently.  ILR interrogation has shown occasional short afib runs. He is feeling good, walking more.  Able to go around the block without dyspnea.  Less claudication now that he has been walking more.  No chest pain.  No lightheadedness/falls.  Going to pulmonary rehab.   ECG (personally reviewed): NSR, PAC  Labs (3/13): LDL 75, HDL 35, K 4.1, creatinine 1.1 Labs (3/14): LDL 91, HDL 27 Labs (10/14): K 4.3, creatinine 0.98 Labs (4/15): K 4.9, creatinine 1.1, LDL 76, HDL 30 Labs (5/15): K 4.3, creatinine 1.7, BNP 251 Labs (6/15): K 4.4, creatinine 1.3 Labs (10/15): TSH normal Labs (5/16): K 4.2, creatinine 1.12, HCT 39.5, LFTs normal, LDL 84, HDL 43, TSH normal Labs (11/16): creatinine 0.94 Labs (4/17): LDL 39, HDL 39 Labs (6/17): K 4.5, creatinine 1.2 Labs (9/17): K 4, creatinine 1.25, HCT 38.7 Labs (12/17): K 4.5, creatinine 1.49 => 1.62, BNP 43 Labs (2/18): K 3.8, creatinine 1.33, LFTs normal, TSH normal Labs (  6/18): K 4.3, creatinine 1.49, LFTs normal, TSH normal, hgb 12.3 Labs (11/18): ESR 60, TSH normal, LFTs normal, creatinine 1.34 Labs (1/19): LDL 50 Labs (5/19): K 4.6, creatinine 1.55, LFTs normal, hgb 12.7, LFTs normal  PMH: 1. HTN: Fatigue and cough with ramipril use.  2. COPD: Quit smoking 2014. PFTs (1/18) with moderately severe COPD.  - CT chest (11/18): Emphysema noted, no ILD.  3. AAA: CT 1/13 with 3.0 cm AAA.  Abdominal US (1/14) with 3.4 cm AAA. Abdominal US (4/15) with 3.25 x 3.27 AAA. Abdominal US (3/16) with 3.7 cm AAA.  Followed at [facility].  - Abd [name] (1/19): 3.3 cm AAA. 4.  Squamous cell lung cancer diagnosed 2/12.  Had right lower lobectomy in 4/12.  5. Hyperlipidemia 6. PAD: Left fem-pop bypass 1994.  ABIs (2/12) 0.62 on right, 0.95 on left. ABIs (1/14): 0.65 on right, 1.04 on left. ABIs (4/15) 0.66 right, 0.87 left.  ABIs (3/16) 0.6 right, 0.88 left.  Patch angioplasty left fem-pop bypass in 11/16.  - Left fem-pop bypass patent on doppler evaluation in 12/17.  7. Stress myoview 2004 was normal. Lexiscan Cardiolite (3/14) with EF 66%, no ischemia or infarction. Lexiscan Cardiolite (7/17) with EF 61%, no ischemia/infarction.  - Cardiolite (12/17) with EF 62%, inferior/inferolateral fixed defect, most likely diaphragmatic attenuation, no ischemia.  - Cardiolite (9/18) with EF > 65%, fixed inferior defect (attenuation versus infarction), no ischemia.  - LHC (11/18): No obstructive CAD.  8. Chronic diastolic CHF: Echo (9/67): technically difficult with EF 55-60%, upper normal RV size.  Echo (5/16) with EF 60-65%.  Echo (7/17) with EF 55-60%, normal RV size and systolic function.  - Echo 12/17 with very poor windows, unable to comment on LV or RV function.  - Cardiac MRI (1/18) with EF 66%, normal RV size and systolic function.  - TEE (5/19): EF 55-60%, normal RV size and systolic function.  9. Carotid stenosis: TIA 10/14.  Carotid dopplers with > 89% RICA, 38-10% LICA.  Patient had right CEA in 10/14. Carotids (4/15) patent right CEA, LICA 17-51% stenosis.  Carotids (02/58) < 52% LICA, patent right CEA.  - Carotid dopplers (12/17): right CEA ok, < 77% LICA stenosis.  - Carotid dopplers (1/19): Right CEA ok, 8-24% LICA stenosis.  10. Atrial fibrillation: Paroxysmal. First noted after lobectomy in 4/12 (brief), recurrence in 4/15 then in 5/15.  - TEE-guided DCCV for persistent atrial fibrillation in 5/19.  11. Spinal stenosis 12. OSA: Using CPAP.  13. Glaucoma 14. Has ILR  SH: Married, lives in [place].  Former [place].  Quit smoking in 2014.   Rare ETOH now.   FH: No premature CAD  ROS: All systems reviewed and negative except as per HPI.   Current Outpatient Medications  Medication Sig Dispense Refill  . acetaminophen (TYLENOL) 325 MG tablet Take 650 mg every 6 (six) hours as needed by mouth for moderate pain or headache.    [name] amiodarone (PACERONE) 200 MG tablet TAKE 1/2 TABLET EVERY DAY. 15 tablet 6  . bimatoprost (LUMIGAN) 0.01 % SOLN Place 1 drop at bedtime into both eyes.     . Biotin 10000 MCG TABS Take 10,000 mcg daily by mouth.     . budesonide-formoterol (SYMBICORT) 160-4.5 MCG/ACT inhaler Inhale 2 puffs into the lungs 2 (two) times daily. 1 Inhaler 12  . CARDURA 4 MG tablet TAKE (1/2) TABLET DAILY. 15 tablet 5  . dorzolamide-timolol (COSOPT) 22.3-6.8 MG/ML ophthalmic solution Place 2 drops 2 (two) times daily into  the right eye.     [name] ELIQUIS 5 MG TABS tablet TAKE 1 TABLET BY MOUTH TWICE DAILY. 60 tablet 2  . FLUoxetine (PROZAC) 20 MG capsule TAKE (1) CAPSULE DAILY. 30 capsule 5  . fluticasone (FLONASE) 50 MCG/ACT nasal spray Place 2 sprays into both nostrils daily.  0  . furosemide (LASIX) 20 MG tablet Take 1 tablet (20 mg total) by mouth daily. 30 tablet 11  . Hypertonic Nasal Wash (SINUS RINSE KIT NA) Place 1 application into the nose daily as needed (congestion).    . iron polysaccharides (POLY-IRON 150) 150 MG capsule Take 150 mg by mouth daily.    . Melatonin 5 MG TABS Take 5 mg by mouth at bedtime.    . montelukast (SINGULAIR) 10 MG tablet Take 1 tablet (10 mg total) by mouth at bedtime. 30 tablet 5  . Multiple Vitamin (MULTIVITAMIN WITH MINERALS) TABS tablet Take 1 tablet by mouth daily. Centrum Silver    . Polyethyl Glycol-Propyl Glycol (SYSTANE OP) Place 1-2 drops as needed into both eyes (for dry eyes).    . potassium chloride SA (K-DUR,KLOR-CON) 20 MEQ tablet TAKE 1 TABLET ONCE DAILY. 30 tablet 3  . ranitidine (ZANTAC) 150 MG tablet Take 150 mg by mouth daily as needed for heartburn.     . rosuvastatin  (CRESTOR) 40 MG tablet TAKE 1 TABLET ONCE DAILY. 30 tablet 2   No current facility-administered medications for this encounter.     BP (!) 124/58   Pulse 67   Wt 92.5 kg (204 lb)   SpO2 98%   BMI 26.91 kg/m  General: NAD Neck: No JVD, no thyromegaly or thyroid nodule.  Lungs: Decreased BS right base.  CV: Nondisplaced PMI.  Heart regular S1/S2, no S3/S4, no murmur.  No peripheral edema.  No carotid bruit.  Unable to palpate pedal pulses.  Abdomen: Soft, nontender, no hepatosplenomegaly, no distention.  Skin: Intact without lesions or rashes.  Neurologic: Alert and oriented x 3.  Psych: Normal affect. Extremities: No clubbing or cyanosis.  HEENT: Normal.   Assessment/Plan: 1. Chronic diastolic CHF: TEE in 6/22 showed LV EF 55-60% with normal RV size and systolic function.  He does not look volume overloaded on exam today.  - He can continue Lasix 20 mg daily. BMET today. 2. Hyperlipidemia: Continue Crestor.  Good lipids in 1/19.  3. Carotid stenosis: s/p R CEA.  Dopplers followed at [facility], stable when done recently.  4. PAD: s/p patch angioplasty to left fem-pop bypass in 11/16.  Followed at [facility]. He is off cilostazol with CHF but his claudication has actually improved with increased exercise.   5. AAA: Stable on last [name], followed at [facility].    6. COPD: No longer smoking. PFTs in 1/18 showed moderately severe obstructive defect. CT chest in 11/18 showed emphysema.  He is not on inhalers due to glaucoma.   - He follows with pulmonary. - Continue pulmonary rehab.  7. Atrial fibrillation: Patient remains in NSR on amiodarone after DCCV in 5/19.  CHADSVASC 3 (age, vascular disease, HTN).  He is off diltiazem with low BP.   He feels considerably better in NSR compared to atrial fibrillation. Minimal atrial fibrillation recently.  Saw [name] and decided to hold off on afib ablation.  - Continue to limit ETOH.  - Continue to use CPAP.  - Continue Eliquis.   - He is maintaining NSR on  amiodarone (had to stop Multaq due to CHF), dose down to 100 mg daily. Check LFTs  and TSH today.  He gets regular eye exams due to glaucoma.  - Atrial fibrillation ablation would be an option if he has more frequent breakthrough.  8. CKD: Stage 3.  BMET today.   9. CAD: LHC in 11/18 with nonobstructive disease only.    Followup in 6 months.    [name] 05/27/2018

## 2018-05-27 NOTE — Progress Notes (Signed)
Daily Session Note  Patient Details  Name: Troy Millett [name] MRN: [id number] Date of Birth: Jan 05, 1941 Referring Provider:     Pulmonary Rehab Walk Test from 03/25/2018 in [place]  Referring Provider  Dr. Melvyn Novas      Encounter Date: 05/27/2018  Check In: Session Check In - 05/27/18 1152      Check-In   Supervising physician immediately available to respond to emergencies  [facility] immediately available    Physician(s)  Dr. Jonnie Finner     Location  [facility]    Staff Present  Su Hilt, MS, ACSM RCEP, Exercise Physiologist;Lisa Ysidro Evert, RN;Carlette Wilber Oliphant, RN, Deland Pretty, MS, ACSM CEP, Exercise Physiologist;Joann Rion, RN, BSN    Medication changes reported      No    Fall or balance concerns reported     No    Tobacco Cessation  No Change    Warm-up and Cool-down  Performed as group-led instruction    Resistance Training Performed  Yes    VAD Patient?  No    PAD/SET Patient?  No      Pain Assessment   Currently in Pain?  No/denies       Capillary Blood Glucose: No results found for this or any previous visit (from the past 24 hour(s)).    Social History   Tobacco Use  Smoking Status Former Smoker  . Packs/day: 0.50  . Years: 50.00  . Pack years: 25.00  . Types: Cigarettes  . Last attempt to quit: 07/29/2011  . Years since quitting: 6.8  Smokeless Tobacco Never Used    Goals Met:  Exercise tolerated well  Goals Unmet:  Not Applicable  Comments: Service time is from 10:30a to 12:!5p    Dr. Rush Farmer is Medical Director for Pulmonary Rehab at [facility].

## 2018-05-29 ENCOUNTER — Encounter (HOSPITAL_COMMUNITY)
Admission: RE | Admit: 2018-05-29 | Discharge: 2018-05-29 | Disposition: A | Discharge status: Home / Self Care | Payer: Commercial Managed Care - PPO | Source: Ambulatory Visit | Attending: Internal Medicine | Admitting: Internal Medicine

## 2018-05-29 VITALS — Wt 203.7 lb

## 2018-05-29 DIAGNOSIS — J449 Chronic obstructive pulmonary disease, unspecified: Secondary | ICD-10-CM | POA: Diagnosis not present

## 2018-05-29 NOTE — Progress Notes (Signed)
  Daily Session Note  Patient Details  Name: Troy Orth [name] MRN: [id number] Date of Birth: [birth year] Referring Provider:     Pulmonary Rehab Walk Test from 03/25/2018 in [place]  Referring Provider  Dr. Melvyn Novas      Encounter Date: 05/29/2018  Check In: Session Check In - 05/29/18 1058      Check-In   Supervising physician immediately available to respond to emergencies  [facility] immediately available    Physician(s)  Dr. Florene Glen    Location  [facility]    Staff Present  Su Hilt, MS, ACSM RCEP, Exercise Physiologist;Carlette Wilber Oliphant, RN, BSN;Ramon Dredge, RN, MHA    Medication changes reported      No    Fall or balance concerns reported     No    Tobacco Cessation  No Change    Warm-up and Cool-down  Performed as group-led instruction    Resistance Training Performed  Yes    VAD Patient?  No    PAD/SET Patient?  No      Pain Assessment   Currently in Pain?  No/denies    Multiple Pain Sites  No       Capillary Blood Glucose: No results found for this or any previous visit (from the past 24 hour(s)).    Social History   Tobacco Use  Smoking Status Former Smoker  . Packs/day: 0.50  . Years: 50.00  . Pack years: 25.00  . Types: Cigarettes  . Last attempt to quit: 07/29/2011  . Years since quitting: 6.8  Smokeless Tobacco Never Used    Goals Met:  Exercise tolerated well  Goals Unmet:  Not Applicable  Comments: Service time is from 10:30p to 12:45p    Dr. Rush Farmer is Medical Director for Pulmonary Rehab at [facility].

## 2018-06-02 LAB — CUP PACEART REMOTE DEVICE CHECK
Implantable Pulse Generator Implant Date: 20161111
MDC IDC SESS DTM: 20190715020911

## 2018-06-03 ENCOUNTER — Encounter (HOSPITAL_COMMUNITY): Payer: Commercial Managed Care - PPO

## 2018-06-04 DIAGNOSIS — Z961 Presence of intraocular lens: Secondary | ICD-10-CM | POA: Insufficient documentation

## 2018-06-04 DIAGNOSIS — H401133 Primary open-angle glaucoma, bilateral, severe stage: Secondary | ICD-10-CM | POA: Insufficient documentation

## 2018-06-05 ENCOUNTER — Encounter (HOSPITAL_COMMUNITY)
Admission: RE | Admit: 2018-06-05 | Discharge: 2018-06-05 | Disposition: A | Discharge status: Home / Self Care | Payer: Commercial Managed Care - PPO | Source: Ambulatory Visit | Attending: Internal Medicine | Admitting: Internal Medicine

## 2018-06-05 DIAGNOSIS — J449 Chronic obstructive pulmonary disease, unspecified: Secondary | ICD-10-CM | POA: Insufficient documentation

## 2018-06-05 NOTE — Progress Notes (Signed)
Daily Session Note  Patient Details  Name: Troy Candy [name] MRN: [id number] Date of Birth: October 12, 1940 Referring Provider:     Pulmonary Rehab Walk Test from 03/25/2018 in [place]  Referring Provider  Dr. Melvyn Novas      Encounter Date: 06/05/2018  Check In: Session Check In - 06/05/18 1138      Check-In   Supervising physician immediately available to respond to emergencies  [facility] immediately available    Physician(s)  Dr. Darrick Meigs     Location  [facility]    Staff Present  Rodney Langton, RN;Carlette Wilber Oliphant, RN, BSN;Joann Rion, RN, BSN;Ramon Dredge, RN, MHA    Medication changes reported      No    Fall or balance concerns reported     No    Tobacco Cessation  No Change    Warm-up and Cool-down  Performed as group-led instruction    Resistance Training Performed  Yes    VAD Patient?  No    PAD/SET Patient?  No      Pain Assessment   Currently in Pain?  No/denies       Capillary Blood Glucose: No results found for this or any previous visit (from the past 24 hour(s)).    Social History   Tobacco Use  Smoking Status Former Smoker  . Packs/day: 0.50  . Years: 50.00  . Pack years: 25.00  . Types: Cigarettes  . Last attempt to quit: 07/29/2011  . Years since quitting: 6.8  Smokeless Tobacco Never Used    Goals Met:  Exercise tolerated well No report of cardiac concerns or symptoms Strength training completed today  Goals Unmet:  Not Applicable  Comments: Service time is from 1030 to 1150    Dr. Rush Farmer is Medical Director for Pulmonary Rehab at [facility].

## 2018-06-10 ENCOUNTER — Encounter (HOSPITAL_COMMUNITY)
Admission: RE | Admit: 2018-06-10 | Discharge: 2018-06-10 | Disposition: A | Discharge status: Home / Self Care | Payer: Commercial Managed Care - PPO | Source: Ambulatory Visit | Attending: Internal Medicine | Admitting: Internal Medicine

## 2018-06-10 DIAGNOSIS — J449 Chronic obstructive pulmonary disease, unspecified: Secondary | ICD-10-CM | POA: Diagnosis not present

## 2018-06-10 NOTE — Progress Notes (Signed)
Daily Session Note  Patient Details  Name: Troy Beeks [name] MRN: [id number] Date of Birth: 07/08/1941 Referring Provider:     Pulmonary Rehab Walk Test from 03/25/2018 in [place]  Referring Provider  Dr. Melvyn Novas      Encounter Date: 06/10/2018  Check In: Session Check In - 06/10/18 1047      Check-In   Supervising physician immediately available to respond to emergencies  [facility] immediately available    Physician(s)  Dr. Darrick Meigs     Location  [facility]    Staff Present  Rodney Langton, RN;Carlette Wilber Oliphant, RN, BSN;Joann Rion, RN, BSN;Ramon Dredge, RN, MHA;Larayah Clute, MS, ACSM RCEP, Exercise Physiologist    Medication changes reported      No    Fall or balance concerns reported     No    Tobacco Cessation  No Change    Warm-up and Cool-down  Performed as group-led instruction    Resistance Training Performed  Yes    VAD Patient?  No    PAD/SET Patient?  No      Pain Assessment   Currently in Pain?  No/denies    Multiple Pain Sites  No       Capillary Blood Glucose: No results found for this or any previous visit (from the past 24 hour(s)).    Social History   Tobacco Use  Smoking Status Former Smoker  . Packs/day: 0.50  . Years: 50.00  . Pack years: 25.00  . Types: Cigarettes  . Last attempt to quit: 07/29/2011  . Years since quitting: 6.8  Smokeless Tobacco Never Used    Goals Met:  Exercise tolerated well  Goals Unmet:  Not Applicable  Comments: Service time is from 10:30a to 12:20p    Dr. Rush Farmer is Medical Director for Pulmonary Rehab at [facility].

## 2018-06-12 ENCOUNTER — Encounter (HOSPITAL_COMMUNITY)
Admission: RE | Admit: 2018-06-12 | Discharge: 2018-06-12 | Disposition: A | Discharge status: Home / Self Care | Payer: Commercial Managed Care - PPO | Source: Ambulatory Visit | Attending: Internal Medicine | Admitting: Internal Medicine

## 2018-06-12 DIAGNOSIS — J449 Chronic obstructive pulmonary disease, unspecified: Secondary | ICD-10-CM

## 2018-06-12 NOTE — Progress Notes (Signed)
Daily Session Note  Patient Details  Name: Troy Cutting [name] MRN: [id number] Date of Birth: 05-25-1941 Referring Provider:     Pulmonary Rehab Walk Test from 03/25/2018 in [place]  Referring Provider  Dr. Melvyn Novas      Encounter Date: 06/12/2018  Check In: Session Check In - 06/12/18 1220      Check-In   Supervising physician immediately available to respond to emergencies  [facility] immediately available    Physician(s)  Dr. Dyann Kief    Location  [facility]    Staff Present  Maurice Small, RN, BSN;Tereza Gilham, MS, ACSM RCEP, Exercise Physiologist;Lisa Ysidro Evert, Felipe Drone, RN, MHA    Medication changes reported      No    Fall or balance concerns reported     No    Tobacco Cessation  No Change    Warm-up and Cool-down  Performed as group-led instruction    Resistance Training Performed  Yes    VAD Patient?  No    PAD/SET Patient?  No      Pain Assessment   Currently in Pain?  No/denies    Pain Score  0-No pain    Multiple Pain Sites  No       Capillary Blood Glucose: No results found for this or any previous visit (from the past 24 hour(s)).    Social History   Tobacco Use  Smoking Status Former Smoker  . Packs/day: 0.50  . Years: 50.00  . Pack years: 25.00  . Types: Cigarettes  . Last attempt to quit: 07/29/2011  . Years since quitting: 6.8  Smokeless Tobacco Never Used    Goals Met:  Exercise tolerated well  Goals Unmet:  Not Applicable  Comments: Service time is from 10:30a to 12:05p    Dr. Rush Farmer is Medical Director for Pulmonary Rehab at [facility].

## 2018-06-17 ENCOUNTER — Encounter (HOSPITAL_COMMUNITY)
Admission: RE | Admit: 2018-06-17 | Discharge: 2018-06-17 | Disposition: A | Discharge status: Home / Self Care | Payer: Commercial Managed Care - PPO | Source: Ambulatory Visit | Attending: Internal Medicine | Admitting: Internal Medicine

## 2018-06-17 VITALS — Wt 202.2 lb

## 2018-06-17 DIAGNOSIS — J449 Chronic obstructive pulmonary disease, unspecified: Secondary | ICD-10-CM

## 2018-06-17 NOTE — Progress Notes (Signed)
Daily Session Note  Patient Details  Name: Troy [name]. MRN: [id number] Date of Birth: May 13, 1941 Referring Provider:     Pulmonary Rehab Walk Test from 03/25/2018 in [place]  Referring Provider  Dr. Melvyn Novas      Encounter Date: 06/17/2018  Check In: Session Check In - 06/17/18 1234      Check-In   Supervising physician immediately available to respond to emergencies  [facility] immediately available    Physician(s)  Dr. Sloan Leiter    Location  [facility]    Staff Present  Maurice Small, RN, BSN;Harshan Kearley, MS, ACSM RCEP, Exercise Physiologist;Lisa Ysidro Evert, Felipe Drone, RN, MHA    Medication changes reported      No    Fall or balance concerns reported     No    Tobacco Cessation  No Change    Warm-up and Cool-down  Performed as group-led instruction    Resistance Training Performed  Yes    VAD Patient?  No    PAD/SET Patient?  No      Pain Assessment   Currently in Pain?  No/denies    Multiple Pain Sites  No       Capillary Blood Glucose: No results found for this or any previous visit (from the past 24 hour(s)).  Exercise Prescription Changes - 06/17/18 1300      Response to Exercise   Blood Pressure (Admit)  108/60    Blood Pressure (Exercise)  144/60    Blood Pressure (Exit)  106/60    Heart Rate (Admit)  75 bpm    Heart Rate (Exercise)  108 bpm    Heart Rate (Exit)  76 bpm    Oxygen Saturation (Admit)  97 %    Oxygen Saturation (Exercise)  96 %    Oxygen Saturation (Exit)  97 %    Rating of Perceived Exertion (Exercise)  13    Perceived Dyspnea (Exercise)  2    Duration  Progress to 45 minutes of aerobic exercise without signs/symptoms of physical distress    Intensity  THRR unchanged      Progression   Progression  Continue to progress workloads to maintain intensity without signs/symptoms of physical distress.      Resistance Training   Training Prescription  Yes    Weight  blue  bands    Reps  10-15    Time  10 Minutes      Interval Training   Interval Training  No      Bike   Level  5    Minutes  17      NuStep   Level  5    SPM  80    Minutes  17    METs  1.6      Track   Laps  12    Minutes  17       Social History   Tobacco Use  Smoking Status Former Smoker  . Packs/day: 0.50  . Years: 50.00  . Pack years: 25.00  . Types: Cigarettes  . Last attempt to quit: 07/29/2011  . Years since quitting: 6.8  Smokeless Tobacco Never Used    Goals Met:  Exercise tolerated well  Goals Unmet:  Not Applicable  Comments: Service time is from 10:30a to 12:10p    Dr. Rush Farmer is Medical Director for Pulmonary Rehab at [facility].

## 2018-06-18 ENCOUNTER — Ambulatory Visit (INDEPENDENT_AMBULATORY_CARE_PROVIDER_SITE_OTHER): Payer: Commercial Managed Care - PPO | Admitting: *Deleted

## 2018-06-18 ENCOUNTER — Encounter (HOSPITAL_COMMUNITY): Payer: Self-pay | Admitting: *Deleted

## 2018-06-18 DIAGNOSIS — I480 Paroxysmal atrial fibrillation: Secondary | ICD-10-CM | POA: Diagnosis not present

## 2018-06-18 NOTE — Progress Notes (Addendum)
Pulmonary Individual Treatment Plan  Patient Details  Name: Troy Gallery [name] MRN: [id number] Date of Birth: [birth year] Referring Provider:     Pulmonary Rehab Walk Test from 03/25/2018 in [facility]  Referring Provider  Dr. Melvyn Novas      Initial Encounter Date:    Pulmonary Rehab Walk Test from 03/25/2018 in [facility]  Date  03/25/18      Visit Diagnosis: Chronic obstructive pulmonary disease, unspecified COPD type ([name])  COPD mixed type ([place])  Patient's Home Medications on Admission:  Current Outpatient Medications:  .  acetaminophen (TYLENOL) 325 MG tablet, Take 650 mg every 6 (six) hours as needed by mouth for moderate pain or headache., Disp: , Rfl:  .  amiodarone (PACERONE) 200 MG tablet, TAKE 1/2 TABLET EVERY DAY., Disp: 15 tablet, Rfl: 6 .  bimatoprost (LUMIGAN) 0.01 % SOLN, Place 1 drop at bedtime into both eyes. , Disp: , Rfl:  .  Biotin 10000 MCG TABS, Take 10,000 mcg daily by mouth. , Disp: , Rfl:  .  budesonide-formoterol (SYMBICORT) 160-4.5 MCG/ACT inhaler, Inhale 2 puffs into the lungs 2 (two) times daily., Disp: 1 Inhaler, Rfl: 12 .  CARDURA 4 MG tablet, TAKE (1/2) TABLET DAILY., Disp: 15 tablet, Rfl: 5 .  dorzolamide-timolol (COSOPT) 22.3-6.8 MG/ML ophthalmic solution, Place 2 drops 2 (two) times daily into the right eye. , Disp: , Rfl:  .  ELIQUIS 5 MG TABS tablet, TAKE 1 TABLET BY MOUTH TWICE DAILY., Disp: 60 tablet, Rfl: 2 .  FLUoxetine (PROZAC) 20 MG capsule, TAKE (1) CAPSULE DAILY., Disp: 30 capsule, Rfl: 5 .  fluticasone (FLONASE) 50 MCG/ACT nasal spray, Place 2 sprays into both nostrils daily., Disp: , Rfl: 0 .  furosemide (LASIX) 20 MG tablet, Take 1 tablet (20 mg total) by mouth daily., Disp: 30 tablet, Rfl: 11 .  Hypertonic Nasal Wash (SINUS RINSE KIT NA), Place 1 application into the nose daily as needed (congestion)., Disp: , Rfl:  .  iron polysaccharides (POLY-IRON 150) 150 MG capsule, Take 150  mg by mouth daily., Disp: , Rfl:  .  Melatonin 5 MG TABS, Take 5 mg by mouth at bedtime., Disp: , Rfl:  .  montelukast (SINGULAIR) 10 MG tablet, Take 1 tablet (10 mg total) by mouth at bedtime., Disp: 30 tablet, Rfl: 5 .  Multiple Vitamin (MULTIVITAMIN WITH MINERALS) TABS tablet, Take 1 tablet by mouth daily. Centrum Silver, Disp: , Rfl:  .  Polyethyl Glycol-Propyl Glycol (SYSTANE OP), Place 1-2 drops as needed into both eyes (for dry eyes)., Disp: , Rfl:  .  potassium chloride SA (K-DUR,KLOR-CON) 20 MEQ tablet, TAKE 1 TABLET ONCE DAILY., Disp: 30 tablet, Rfl: 3 .  ranitidine (ZANTAC) 150 MG tablet, Take 150 mg by mouth daily as needed for heartburn. , Disp: , Rfl:  .  rosuvastatin (CRESTOR) 40 MG tablet, TAKE 1 TABLET ONCE DAILY., Disp: 30 tablet, Rfl: 2  Past Medical History: Past Medical History:  Diagnosis Date  . AAA (abdominal aortic aneurysm) (HCC) LAST ABD. [name]  JAN 2014  3.4CM   MONITORED BY DR [name]  . Arthritis   . Atrial fibrillation ([place])   . Benign essential HTN 01/19/2016  . Bilateral carotid artery stenosis DUPLEX 12-29-2012  BY DR [facility]   BILATERAL ICA STENOSIS 60-79%  . BPH (benign prostatic hypertrophy)   . Cancer ([place]) 01-25-12   LUNG  . Complication of anesthesia    had nausea after carotid artery surgery, states the medicine given  to help the nausea, made it worse  . COPD (chronic obstructive pulmonary disease) ([place])   . Exertional dyspnea   . Frequency of urination   . GERD (gastroesophageal reflux disease)   . Glaucoma BOTH EYES   Dr Gershon Crane  . Glaucoma   . History of basal cell carcinoma excision   . History of lung cancer APRIL 2012  SQUAMOUS CELL---- S/P RIGHT LOWER LOBECTOMY AT [facility] --  NO CHEMORADIATION---  NO RECURRENCE    ONCOLOGIST- DR Tressie Stalker  LOV IN [facility] 10-27-2012  . Hx of adenomatous colonic polyps 2005    X 2; 1 hyperplastic polyp; Dr Olevia Perches  . Hyperlipidemia   . Hypertension   . Impaired fasting glucose 2007   108; A1c5.4%  . Lesion of  bladder   . Microhematuria   . Nocturia   . OSA (obstructive sleep apnea) 08/29/2015   Mild with AHI overall 6/hr but severe during REM sleep with AHI 30/hr with oxygen saturations < 88% for 5.5 minutes of total sleep time.    [name] PAD (peripheral artery disease) ([place]) ABI'S  JAN 2014  0.65 ON RIGHT ;  1.04 ON LEFT  . Peripheral vascular disease ([place]) S/P ANGIOPLASTY AND STENTING   FOLLOWED  BY DR [name]  . Spinal stenosis Sept. 2015  . Stroke [facility]) Jul 07, 2013   mini  TIA  . Urgency of urination     Tobacco Use: Social History   Tobacco Use  Smoking Status Former Smoker  . Packs/day: 0.50  . Years: 50.00  . Pack years: 25.00  . Types: Cigarettes  . Last attempt to quit: 07/29/2011  . Years since quitting: 6.8  Smokeless Tobacco Never Used    Labs: Recent Review Flowsheet Data    Labs for ITP Cardiac and Pulmonary Rehab Latest Ref Rng & Units 01/04/2016 11/27/2016 05/03/2017 10/25/2017 11/04/2017   Cholestrol 0 - 200 mg/dL 95(L) 113 - 105 -   LDLCALC 0 - 99 mg/dL 39 39 - 50 -   HDL >40 mg/dL 39(L) 46 - 35(L) -   Trlycerides <150 mg/dL 86 140 - 101 -   Hemoglobin A1c - - - 5.7 - 5.9   TCO2 0 - 100 mmol/L - - - - -      Capillary Blood Glucose: Lab Results  Component Value Date   GLUCAP 97 08/29/2015   GLUCAP 111 (H) 07/07/2013   GLUCAP 112 (H) 12/22/2010     Exercise Target Goals: Exercise Program Goal: Individual exercise prescription set using results from initial 6 min walk test and THRR while considering  patient's activity barriers and safety.   Exercise Prescription Goal: Initial exercise prescription builds to 30-45 minutes a day of aerobic activity, 2-3 days per week.  Home exercise guidelines will be given to patient during program as part of exercise prescription that the participant will acknowledge.  Activity Barriers & Risk Stratification: Activity Barriers & Cardiac Risk Stratification - 03/21/18 1014      Activity Barriers & Cardiac Risk Stratification    Activity Barriers  Shortness of Breath;Balance Concerns;History of Falls       6 Minute Walk: 6 Minute Walk    Row Name 03/27/18 0724         6 Minute Walk   Phase  Initial     Distance  1194 feet     Walk Time  6 minutes     # of Rest Breaks  0     MPH  2.26  METS  2.76     RPE  12     Perceived Dyspnea   2     Symptoms  Yes (comment)     Comments  4/10 calf pain     Resting HR  57 bpm     Resting BP  126/69     Resting Oxygen Saturation   98 %     Exercise Oxygen Saturation  during 6 min walk  94 %     Max Ex. HR  74 bpm     Max Ex. BP  192/67     2 Minute Post BP  152/78       Interval HR   1 Minute HR  63     2 Minute HR  73     3 Minute HR  71     4 Minute HR  74     5 Minute HR  73     6 Minute HR  70     2 Minute Post HR  57     Interval Heart Rate?  Yes       Interval Oxygen   Baseline Oxygen Saturation %  98 %     1 Minute Oxygen Saturation %  96 %     1 Minute Liters of Oxygen  0 L     2 Minute Oxygen Saturation %  95 %     2 Minute Liters of Oxygen  0 L     3 Minute Oxygen Saturation %  96 %     3 Minute Liters of Oxygen  0 L     4 Minute Oxygen Saturation %  94 %     4 Minute Liters of Oxygen  0 L     5 Minute Oxygen Saturation %  95 %     5 Minute Liters of Oxygen  0 L     6 Minute Oxygen Saturation %  94 %     6 Minute Liters of Oxygen  0 L     2 Minute Post Oxygen Saturation %  98 %     2 Minute Post Liters of Oxygen  0 L        Oxygen Initial Assessment: Oxygen Initial Assessment - 03/27/18 0724      Initial 6 min Walk   Oxygen Used  None      Program Oxygen Prescription   Program Oxygen Prescription  None       Oxygen Re-Evaluation: Oxygen Re-Evaluation    Row Name 01/03/18 1025 04/21/18 0943 05/19/18 1353 06/16/18 1409       Program Oxygen Prescription   Program Oxygen Prescription  None  None  None  None      Home Oxygen   Home Oxygen Device  None  None  None  None    Sleep Oxygen Prescription  CPAP  CPAP  CPAP   CPAP    Liters per minute  -  0  0  0    Home Exercise Oxygen Prescription  None  None  None  None    Home at Rest Exercise Oxygen Prescription  None  None  None  None    Compliance with Home Oxygen Use  Yes  Yes  Yes  Yes      Goals/Expected Outcomes   Short Term Goals  -  To learn and understand importance of maintaining oxygen saturations>88%;To learn and demonstrate proper use of respiratory medications;To learn and understand importance of monitoring  SPO2 with pulse oximeter and demonstrate accurate use of the pulse oximeter.;To learn and demonstrate proper pursed lip breathing techniques or other breathing techniques.  To learn and understand importance of maintaining oxygen saturations>88%;To learn and demonstrate proper use of respiratory medications;To learn and understand importance of monitoring SPO2 with pulse oximeter and demonstrate accurate use of the pulse oximeter.;To learn and demonstrate proper pursed lip breathing techniques or other breathing techniques.  To learn and understand importance of maintaining oxygen saturations>88%;To learn and demonstrate proper use of respiratory medications;To learn and understand importance of monitoring SPO2 with pulse oximeter and demonstrate accurate use of the pulse oximeter.;To learn and demonstrate proper pursed lip breathing techniques or other breathing techniques.    Long  Term Goals  -  Verbalizes importance of monitoring SPO2 with pulse oximeter and return demonstration;Maintenance of O2 saturations>88%;Exhibits proper breathing techniques, such as pursed lip breathing or other method taught during program session;Compliance with respiratory medication;Demonstrates proper use of MDI's  Verbalizes importance of monitoring SPO2 with pulse oximeter and return demonstration;Maintenance of O2 saturations>88%;Exhibits proper breathing techniques, such as pursed lip breathing or other method taught during program session;Compliance with respiratory  medication;Demonstrates proper use of MDI's  Verbalizes importance of monitoring SPO2 with pulse oximeter and return demonstration;Maintenance of O2 saturations>88%;Exhibits proper breathing techniques, such as pursed lip breathing or other method taught during program session;Compliance with respiratory medication;Demonstrates proper use of MDI's    Goals/Expected Outcomes  Cont. to be compliant with CPAP  Cont. to be compliant with CPAP  Cont. to be compliant with CPAP  Cont. to be compliant with CPAP       Oxygen Discharge (Final Oxygen Re-Evaluation): Oxygen Re-Evaluation - 06/16/18 1409      Program Oxygen Prescription   Program Oxygen Prescription  None      Home Oxygen   Home Oxygen Device  None    Sleep Oxygen Prescription  CPAP    Liters per minute  0    Home Exercise Oxygen Prescription  None    Home at Rest Exercise Oxygen Prescription  None    Compliance with Home Oxygen Use  Yes      Goals/Expected Outcomes   Short Term Goals  To learn and understand importance of maintaining oxygen saturations>88%;To learn and demonstrate proper use of respiratory medications;To learn and understand importance of monitoring SPO2 with pulse oximeter and demonstrate accurate use of the pulse oximeter.;To learn and demonstrate proper pursed lip breathing techniques or other breathing techniques.    Long  Term Goals  Verbalizes importance of monitoring SPO2 with pulse oximeter and return demonstration;Maintenance of O2 saturations>88%;Exhibits proper breathing techniques, such as pursed lip breathing or other method taught during program session;Compliance with respiratory medication;Demonstrates proper use of MDI's    Goals/Expected Outcomes  Cont. to be compliant with CPAP       Initial Exercise Prescription: Initial Exercise Prescription - 03/27/18 0700      Date of Initial Exercise RX and Referring Provider   Date  03/25/18    Referring Provider  Dr. Melvyn Novas      Bike   Level  0.6     Minutes  17      NuStep   Level  4    SPM  80    Minutes  17    METs  1.7      Track   Laps  10    Minutes  17      Prescription Details   Frequency (times per week)  2    Duration  Progress to 45 minutes of aerobic exercise without signs/symptoms of physical distress      Intensity   THRR 40-80% of Max Heartrate  57-114    Ratings of Perceived Exertion  11-13    Perceived Dyspnea  0-4      Progression   Progression  Continue progressive overload as per policy without signs/symptoms or physical distress.      Resistance Training   Training Prescription  Yes    Weight  blue bands    Reps  10-15       Perform Capillary Blood Glucose checks as needed.  Exercise Prescription Changes: Exercise Prescription Changes    Row Name 12/31/17 1200 01/14/18 1220 04/01/18 1200 04/08/18 1400 04/22/18 1200     Response to Exercise   Blood Pressure (Admit)  150/66  134/60  -  124/52  122/66   Blood Pressure (Exercise)  118/60  148/56  -  128/60  140/60   Blood Pressure (Exit)  130/70  120/62  -  120/68  110/60   Heart Rate (Admit)  53 bpm  63 bpm  -  49 bpm  81 bpm   Heart Rate (Exercise)  60 bpm  62 bpm  -  56 bpm  88 bpm   Heart Rate (Exit)  49 bpm  57 bpm  -  51 bpm  81 bpm   Oxygen Saturation (Admit)  97 %  94 %  -  98 %  97 %   Oxygen Saturation (Exercise)  94 %  93 %  -  95 %  99 %   Oxygen Saturation (Exit)  97 %  97 %  -  95 %  99 %   Rating of Perceived Exertion (Exercise)  13  13  -  13  11   Perceived Dyspnea (Exercise)  3  1  -  1  1   Duration  Continue with 45 min of aerobic exercise without signs/symptoms of physical distress.  Continue with 45 min of aerobic exercise without signs/symptoms of physical distress.  -  Progress to 45 minutes of aerobic exercise without signs/symptoms of physical distress  Progress to 45 minutes of aerobic exercise without signs/symptoms of physical distress   Intensity  THRR unchanged  THRR unchanged  -  THRR unchanged  THRR unchanged      Progression   Progression  Continue to progress workloads to maintain intensity without signs/symptoms of physical distress.  Continue to progress workloads to maintain intensity without signs/symptoms of physical distress.  -  Continue to progress workloads to maintain intensity without signs/symptoms of physical distress.  Continue to progress workloads to maintain intensity without signs/symptoms of physical distress.     Resistance Training   Training Prescription  Yes  Yes  -  Yes  Yes   Weight  Blue bands  Blue bands  -  blue bands  blue bands   Reps  10-15  10-15  -  10-15  10-15   Time  10 Minutes  10 Minutes  -  10 Minutes  10 Minutes     Interval Training   Interval Training  -  -  -  No  No     Bike   Level  4  4  -  4  4   Minutes  17  17  -  17  17     NuStep   Level  4  5  -  4  5   SPM  80  80  -  80  80   Minutes  17  17  -  17  17   METs  1.8  1.9  -  1.6  1.9     Track   Laps  10  7  -  7  6   Minutes  17  17  -  17  17     Home Exercise Plan   Plans to continue exercise at  -  -  Home (comment)  -  -   Frequency  -  -  Add 2 additional days to program exercise sessions.  -  -   Row Name 05/08/18 0700 05/20/18 1300 05/29/18 1641 06/17/18 1300       Response to Exercise   Blood Pressure (Admit)  94/62  104/60  118/56  108/60    Blood Pressure (Exercise)  144/58  136/70  128/78  144/60    Blood Pressure (Exit)  112/56  102/60  150/72  106/60    Heart Rate (Admit)  50 bpm  63 bpm  76 bpm  75 bpm    Heart Rate (Exercise)  68 bpm  79 bpm  71 bpm  108 bpm    Heart Rate (Exit)  84 bpm  76 bpm  66 bpm  76 bpm    Oxygen Saturation (Admit)  98 %  98 %  95 %  97 %    Oxygen Saturation (Exercise)  95 %  95 %  99 %  96 %    Oxygen Saturation (Exit)  97 %  97 %  99 %  97 %    Rating of Perceived Exertion (Exercise)  '13  13  11  13    '$ Perceived Dyspnea (Exercise)  '1  2  1  2    '$ Duration  Progress to 45 minutes of aerobic exercise without signs/symptoms of physical  distress  Progress to 45 minutes of aerobic exercise without signs/symptoms of physical distress  Progress to 45 minutes of aerobic exercise without signs/symptoms of physical distress  Progress to 45 minutes of aerobic exercise without signs/symptoms of physical distress    Intensity  THRR unchanged  THRR unchanged  THRR unchanged  THRR unchanged      Progression   Progression  Continue to progress workloads to maintain intensity without signs/symptoms of physical distress.  Continue to progress workloads to maintain intensity without signs/symptoms of physical distress.  Continue to progress workloads to maintain intensity without signs/symptoms of physical distress.  Continue to progress workloads to maintain intensity without signs/symptoms of physical distress.      Resistance Training   Training Prescription  Yes  Yes  Yes  Yes    Weight  blue bands  blue bands  blue bands  blue bands    Reps  10-15  10-15  10-15  10-15    Time  10 Minutes  10 Minutes  10 Minutes  10 Minutes      Interval Training   Interval Training  No  No  No  No      Bike   Level  4  5  -  5    Minutes  17  17  -  17      NuStep   Level  5  4  -  5    SPM  80  80  -  80    Minutes  17  17  -  17    METs  2  1.9  -  1.6      Track   Laps  '10  10  13  12    '$ Minutes  '17  17  17  17       '$ Exercise Comments: Exercise Comments    Row Name 02/10/18 1425 04/01/18 1243         Exercise Comments  Patient is now d/c from program due to hospitalization  Home exercise completed         Exercise Goals and Review: Exercise Goals    Row Name 03/21/18 1017             Exercise Goals   Increase Physical Activity  Yes       Intervention  Provide advice, education, support and counseling about physical activity/exercise needs.;Develop an individualized exercise prescription for aerobic and resistive training based on initial evaluation findings, risk stratification, comorbidities and participant's personal  goals.       Expected Outcomes  Short Term: Attend rehab on a regular basis to increase amount of physical activity.;Long Term: Add in home exercise to make exercise part of routine and to increase amount of physical activity.;Long Term: Exercising regularly at least 3-5 days a week.       Increase Strength and Stamina  Yes       Intervention  Provide advice, education, support and counseling about physical activity/exercise needs.;Develop an individualized exercise prescription for aerobic and resistive training based on initial evaluation findings, risk stratification, comorbidities and participant's personal goals.       Expected Outcomes  Short Term: Increase workloads from initial exercise prescription for resistance, speed, and METs.;Short Term: Perform resistance training exercises routinely during rehab and add in resistance training at home;Long Term: Improve cardiorespiratory fitness, muscular endurance and strength as measured by increased METs and functional capacity (6MWT)       Able to understand and use rate of perceived exertion (RPE) scale  Yes       Intervention  Provide education and explanation on how to use RPE scale       Expected Outcomes  Short Term: Able to use RPE daily in rehab to express subjective intensity level;Long Term:  Able to use RPE to guide intensity level when exercising independently       Able to understand and use Dyspnea scale  Yes       Intervention  Provide education and explanation on how to use Dyspnea scale       Expected Outcomes  Short Term: Able to use Dyspnea scale daily in rehab to express subjective sense of shortness of breath during exertion;Long Term: Able to use Dyspnea scale to guide intensity level when exercising independently       Knowledge and understanding of Target Heart Rate Range (THRR)  Yes       Intervention  Provide education and explanation of THRR including how the numbers were predicted and where they are located for reference        Expected Outcomes  Short Term: Able to state/look up THRR;Long Term: Able to use THRR to govern intensity when exercising independently;Short Term: Able to use daily as guideline for intensity in rehab       Understanding of Exercise Prescription  Yes       Intervention  Provide education, explanation, and written materials on patient's individual exercise prescription       Expected Outcomes  Short Term: Able to explain program exercise  prescription;Long Term: Able to explain home exercise prescription to exercise independently          Exercise Goals Re-Evaluation : Exercise Goals Re-Evaluation    Row Name 01/03/18 1026 03/28/18 1054 04/21/18 0956 05/19/18 1353 06/16/18 1410     Exercise Goal Re-Evaluation   Exercise Goals Review  Increase Strength and Stamina;Able to understand and use Dyspnea scale;Increase Physical Activity;Able to understand and use rate of perceived exertion (RPE) scale;Knowledge and understanding of Target Heart Rate Range (THRR);Understanding of Exercise Prescription  Increase Strength and Stamina;Increase Physical Activity;Able to understand and use rate of perceived exertion (RPE) scale;Knowledge and understanding of Target Heart Rate Range (THRR);Understanding of Exercise Prescription;Able to understand and use Dyspnea scale;Able to check pulse independently  Increase Strength and Stamina;Increase Physical Activity;Able to understand and use rate of perceived exertion (RPE) scale;Knowledge and understanding of Target Heart Rate Range (THRR);Understanding of Exercise Prescription;Able to understand and use Dyspnea scale  Increase Strength and Stamina;Increase Physical Activity;Able to understand and use rate of perceived exertion (RPE) scale;Knowledge and understanding of Target Heart Rate Range (THRR);Understanding of Exercise Prescription;Able to understand and use Dyspnea scale  Increase Strength and Stamina;Increase Physical Activity;Able to understand and use rate of  perceived exertion (RPE) scale;Knowledge and understanding of Target Heart Rate Range (THRR);Understanding of Exercise Prescription;Able to understand and use Dyspnea scale   Comments  PAD is a barrier for this patient. He is progressing wel and has started to exercise at home. Patient is able to walk up to 10 laps (200 ft) in 15 minutes. This includes breaks due to his leg pain. MET level still places him in a "low" functional level. Will cont to monitor patient and progress as able.   Patient will restart Pulmonary Rehab on 04/01/18. Will monitor and motivate as tolerated.   Patient has only completed 4 rehab sessions. Will cont. to monitor and progress as able.  Patient is able to walk 13 laps (200 ft each) in 15 minutes. Patient is open to new workloads but is limited by his claudication pain. MET average places him in a low level. Will cont. to monitor and progress as able.  Patient is able to walk 11 laps (200 ft each) in 15 minutes. Patient is open to new workloads but is limited by his claudication pain. MET average places him in a low level.  Patient will be graduating soon-will encourage exercise at home or staying in our maintenance program. Will cont. to monitor and progress as able.   Expected Outcomes  Through exercise at rehab, patient will increase strength and stamina and gain enough confidence to start an exercise regime at home.   Through exercise at rehab and at home, the patient will decrease shortness of breath with daily activities and feel confident in carrying out an exercise regime at home.   Through exercise at rehab and at home, the patient will decrease shortness of breath with daily activities and feel confident in carrying out an exercise regime at home.   Through exercise at rehab and at home, the patient will decrease shortness of breath with daily activities and feel confident in carrying out an exercise regime at home.   Through exercise at rehab and at home, the patient will  decrease shortness of breath with daily activities and feel confident in carrying out an exercise regime at home.       Discharge Exercise Prescription (Final Exercise Prescription Changes): Exercise Prescription Changes - 06/17/18 1300      Response to Exercise  Blood Pressure (Admit)  108/60    Blood Pressure (Exercise)  144/60    Blood Pressure (Exit)  106/60    Heart Rate (Admit)  75 bpm    Heart Rate (Exercise)  108 bpm    Heart Rate (Exit)  76 bpm    Oxygen Saturation (Admit)  97 %    Oxygen Saturation (Exercise)  96 %    Oxygen Saturation (Exit)  97 %    Rating of Perceived Exertion (Exercise)  13    Perceived Dyspnea (Exercise)  2    Duration  Progress to 45 minutes of aerobic exercise without signs/symptoms of physical distress    Intensity  THRR unchanged      Progression   Progression  Continue to progress workloads to maintain intensity without signs/symptoms of physical distress.      Resistance Training   Training Prescription  Yes    Weight  blue bands    Reps  10-15    Time  10 Minutes      Interval Training   Interval Training  No      Bike   Level  5    Minutes  17      NuStep   Level  5    SPM  80    Minutes  17    METs  1.6      Track   Laps  12    Minutes  17       Nutrition:  Target Goals: Understanding of nutrition guidelines, daily intake of sodium '1500mg'$ , cholesterol '200mg'$ , calories 30% from fat and 7% or less from saturated fats, daily to have 5 or more servings of fruits and vegetables.  Biometrics:    Nutrition Therapy Plan and Nutrition Goals: Nutrition Therapy & Goals - 06/13/18 0810      Nutrition Therapy   Diet  Heart Healthy      Personal Nutrition Goals   Nutrition Goal  Pt to identify and limit food sources of sodium.    Personal Goal #2  Identify food quantities necessary to achieve wt loss of  -2# per week to a goal wt loss of 6-24 lb at graduation from pulmonary rehab.      Intervention Plan   Intervention   Prescribe, educate and counsel regarding individualized specific dietary modifications aiming towards targeted core components such as weight, hypertension, lipid management, diabetes, heart failure and other comorbidities.    Expected Outcomes  Short Term Goal: Understand basic principles of dietary content, such as calories, fat, sodium, cholesterol and nutrients.;Long Term Goal: Adherence to prescribed nutrition plan.       Nutrition Assessments: Nutrition Assessments - 06/13/18 0811      Rate Your Plate Scores   Pre Score  42    Post Score  30       Nutrition Goals Re-Evaluation: Nutrition Goals Re-Evaluation    Row Name 04/08/18 1148 06/13/18 0811           Goals   Nutrition Goal  Pt to identify and limit food sources of sodium.  Pt to identify and limit food sources of sodium.      Comment  Identify food quantities necessary to achieve wt loss of  -2# per week to a goal wt loss of 6-24 lb at graduation from pulmonary rehab.  nutrition goal met        Personal Goal #2 Re-Evaluation   Personal Goal #2  -  Identify food quantities necessary to achieve wt loss  of  -2# per week to a goal wt loss of 6-24 lb at graduation from pulmonary rehab. weight loss goal not met, pt maintained weight         Nutrition Goals Re-Evaluation: Nutrition Goals Re-Evaluation    [place] Name 04/08/18 1148 06/13/18 0811           Goals   Nutrition Goal  Pt to identify and limit food sources of sodium.  Pt to identify and limit food sources of sodium.      Comment  Identify food quantities necessary to achieve wt loss of  -2# per week to a goal wt loss of 6-24 lb at graduation from pulmonary rehab.  nutrition goal met        Personal Goal #2 Re-Evaluation   Personal Goal #2  -  Identify food quantities necessary to achieve wt loss of  -2# per week to a goal wt loss of 6-24 lb at graduation from pulmonary rehab. weight loss goal not met, pt maintained weight         Nutrition Goals Discharge  (Final Nutrition Goals Re-Evaluation): Nutrition Goals Re-Evaluation - 06/13/18 0811      Goals   Nutrition Goal  Pt to identify and limit food sources of sodium.    Comment  nutrition goal met      Personal Goal #2 Re-Evaluation   Personal Goal #2  Identify food quantities necessary to achieve wt loss of  -2# per week to a goal wt loss of 6-24 lb at graduation from pulmonary rehab.   weight loss goal not met, pt maintained weight      Psychosocial: Target Goals: Acknowledge presence or absence of significant depression and/or stress, maximize coping skills, provide positive support system. Participant is able to verbalize types and ability to use techniques and skills needed for reducing stress and depression.  Initial Review & Psychosocial Screening: Initial Psych Review & Screening - 03/21/18 1010      Initial Review   Current issues with  None Identified;History of Depression   diminshed vision caused change in activity level and hobbies     [place]?  Yes      Barriers   Psychosocial barriers to participate in program  There are no identifiable barriers or psychosocial needs.      Screening Interventions   Interventions  Encouraged to exercise    Expected Outcomes  Long Term goal: The participant improves quality of Life and PHQ9 Scores as seen by post scores and/or verbalization of changes;Short Term goal: Identification and review with participant of any Quality of Life or Depression concerns found by scoring the questionnaire.       Quality of Life Scores:  Scores of 19 and below usually indicate a poorer quality of life in these areas.  A difference of  2-3 points is a clinically meaningful difference.  A difference of 2-3 points in the total score of the Quality of Life Index has been associated with significant improvement in overall quality of life, self-image, physical symptoms, and general health in studies assessing change in quality  of life.  PHQ-9: Recent Review Flowsheet Data    Depression screen [facility] 2/9 03/21/2018 11/18/2017 11/04/2017 01/12/2015   Decreased Interest 0 0 0 0   Down, Depressed, Hopeless 0 0 0 0   PHQ - 2 Score 0 0 0 0   Altered sleeping 0 - - -   Tired, decreased energy 1 - - -  Change in appetite 0 - - -   Trouble concentrating 0 - - -   Moving slowly or fidgety/restless 0 - - -   Suicidal thoughts 0 - - -   PHQ-9 Score 1 - - -   Difficult doing work/chores Not difficult at all - - -     Interpretation of Total Score  Total Score Depression Severity:  1-4 = Minimal depression, 5-9 = Mild depression, 10-14 = Moderate depression, 15-19 = Moderately severe depression, 20-27 = Severe depression   Psychosocial Evaluation and Intervention: Psychosocial Evaluation - 06/18/18 1425      Psychosocial Evaluation & Interventions   Interventions  Encouraged to exercise with the program and follow exercise prescription    Comments  no psychosocial issues identified    Expected Outcomes  no barriers to participation in pulmonary rehab    Continue Psychosocial Services   No Follow up required       Psychosocial Re-Evaluation: Psychosocial Re-Evaluation    [place] Name 01/06/18 1010 02/03/18 1029 04/01/18 1313 04/21/18 1635 05/20/18 1653     Psychosocial Re-Evaluation   Current issues with  None Identified  None Identified  None Identified  None Identified  None Identified   Comments  -  None identified at last face to face encounter during exercise session.  none identified  none identified  none identified   Expected Outcomes  no barriers to participation in pulmonary rehab  -  -  no barriers to participation in pulmonary rehab  no barriers to participation in pulmonary rehab   Interventions  Encouraged to attend Pulmonary Rehabilitation for the exercise  -  Encouraged to attend Pulmonary Rehabilitation for the exercise  Relaxation education  Relaxation education;Stress management education;Encouraged to  attend Pulmonary Rehabilitation for the exercise   Continue Psychosocial Services   Follow up required by staff  -  No Follow up required  Follow up required by staff  Follow up required by staff   [place] Name 06/18/18 1425             Psychosocial Re-Evaluation   Current issues with  None Identified       Comments  Pt plans to graduate on the next exercise session.  Pt initially was slow to warm but has increased his interaction with staff and fellow participants       Expected Outcomes  no barriers to participation in pulmonary rehab       Continue Psychosocial Services   No Follow up required          Psychosocial Discharge (Final Psychosocial Re-Evaluation): Psychosocial Re-Evaluation - 06/18/18 1425      Psychosocial Re-Evaluation   Current issues with  None Identified    Comments  Pt plans to graduate on the next exercise session.  Pt initially was slow to warm but has increased his interaction with staff and fellow participants    Expected Outcomes  no barriers to participation in pulmonary rehab    Continue Psychosocial Services   No Follow up required       Vocational Rehabilitation: Provide vocational rehab assistance to qualifying candidates.   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 Participant will state understanding/return demonstration of topics presented.  Learning Barriers/Preferences: Learning Barriers/Preferences - 03/21/18 1012      Learning Barriers/Preferences   Learning Barriers  Sight;Hearing   has aids for hearing but does not use them   Learning Preferences  Individual Instruction;Skilled Demonstration;Verbal Instruction       Education  Topics: Count Your Pulse:  -Group instruction provided by verbal instruction, demonstration, patient participation and written materials to support subject.  Instructors address importance of being able to find your pulse and how to count your pulse when at home without a heart monitor.  Patients get hands on experience counting their pulse with staff help and individually.   Heart Attack, Angina, and Risk Factor Modification:  -Group instruction provided by verbal instruction, video, and written materials to support subject.  Instructors address signs and symptoms of angina and heart attacks.    Also discuss risk factors for heart disease and how to make changes to improve heart health risk factors.   Functional Fitness:  -Group instruction provided by verbal instruction, demonstration, patient participation, and written materials to support subject.  Instructors address safety measures for doing things around the house.  Discuss how to get up and down off the floor, how to pick things up properly, how to safely get out of a chair without assistance, and balance training.   Meditation and Mindfulness:  -Group instruction provided by verbal instruction, patient participation, and written materials to support subject.  Instructor addresses importance of mindfulness and meditation practice to help reduce stress and improve awareness.  Instructor also leads participants through a meditation exercise.    PULMONARY REHAB CHRONIC OBSTRUCTIVE PULMONARY DISEASE from 06/10/2018 in [place]  Date  05/15/18  Educator  bh  Instruction Review Code  2- Demonstrated Understanding      Stretching for Flexibility and Mobility:  -Group instruction provided by verbal instruction, patient participation, and written materials to support subject.  Instructors lead participants through series of stretches that are designed to increase flexibility thus improving mobility.  These  stretches are additional exercise for major muscle groups that are typically performed during regular warm up and cool down.   Hands Only CPR:  -Group verbal, video, and participation provides a basic overview of AHA guidelines for community CPR. Role-play of emergencies allow participants the opportunity to practice calling for help and chest compression technique with discussion of AED use.   Hypertension: -Group verbal and written instruction that provides a basic overview of hypertension including the most recent diagnostic guidelines, risk factor reduction with self-care instructions and medication management.    Nutrition I class: Heart Healthy Eating:  -Group instruction provided by PowerPoint slides, verbal discussion, and written materials to support subject matter. The instructor gives an explanation and review of the Therapeutic Lifestyle Changes diet recommendations, which includes a discussion on lipid goals, dietary fat, sodium, fiber, plant stanol/sterol esters, sugar, and the components of a well-balanced, healthy diet.   Nutrition II class: Lifestyle Skills:  -Group instruction provided by PowerPoint slides, verbal discussion, and written materials to support subject matter. The instructor gives an explanation and review of label reading, grocery shopping for heart health, heart healthy recipe modifications, and ways to make healthier choices when eating out.   Diabetes Question & Answer:  -Group instruction provided by PowerPoint slides, verbal discussion, and written materials to support subject matter. The instructor gives an explanation and review of diabetes co-morbidities, pre- and post-prandial blood glucose goals, pre-exercise blood glucose goals, signs, symptoms, and treatment of hypoglycemia and hyperglycemia,  and foot care basics.   Diabetes Blitz:  -Group instruction provided by PowerPoint slides, verbal discussion, and written materials to support subject  matter. The instructor gives an explanation and review of the physiology behind type 1 and type 2 diabetes, diabetes medications and rational behind using different medications, pre- and post-prandial blood glucose recommendations and Hemoglobin A1c goals, diabetes diet, and exercise including blood glucose guidelines for exercising safely.    Portion Distortion:  -Group instruction provided by PowerPoint slides, verbal discussion, written materials, and food models to support subject matter. The instructor gives an explanation of serving size versus portion size, changes in portions sizes over the last 20 years, and what consists of a serving from each food group.   Stress Management:  -Group instruction provided by verbal instruction, video, and written materials to support subject matter.  Instructors review role of stress in heart disease and how to cope with stress positively.     Exercising on Your Own:  -Group instruction provided by verbal instruction, power point, and written materials to support subject.  Instructors discuss benefits of exercise, components of exercise, frequency and intensity of exercise, and end points for exercise.  Also discuss use of nitroglycerin and activating EMS.  Review options of places to exercise outside of rehab.  Review guidelines for sex with heart disease.   Cardiac Drugs I:  -Group instruction provided by verbal instruction and written materials to support subject.  Instructor reviews cardiac drug classes: antiplatelets, anticoagulants, beta blockers, and statins.  Instructor discusses reasons, side effects, and lifestyle considerations for each drug class.   Cardiac Drugs II:  -Group instruction provided by verbal instruction and written materials to support subject.  Instructor reviews cardiac drug classes: angiotensin converting enzyme inhibitors (ACE-I), angiotensin II receptor blockers (ARBs), nitrates, and calcium channel blockers.  Instructor  discusses reasons, side effects, and lifestyle considerations for each drug class.   Anatomy and Physiology of the Circulatory System:  Group verbal and written instruction and models provide basic cardiac anatomy and physiology, with the coronary electrical and arterial systems. Review of: AMI, Angina, Valve disease, Heart Failure, Peripheral Artery Disease, Cardiac Arrhythmia, Pacemakers, and the ICD.   Other Education:  -Group or individual verbal, written, or video instructions that support the educational goals of the cardiac rehab program.   PULMONARY REHAB CHRONIC OBSTRUCTIVE PULMONARY DISEASE from 06/10/2018 in [place]  Date  01/02/18 Rodney Booze a sedentary lifestyle]  Educator  EP  Instruction Review Code  1- [employer] Eating Survival Tips:  -Group instruction provided by PowerPoint slides, verbal discussion, and written materials to support subject matter. The instructor gives patients tips, tricks, and techniques to help them not only survive but enjoy the holidays despite the onslaught of food that accompanies the holidays.   Knowledge Questionnaire Score: Knowledge Questionnaire Score - 03/25/18 1543      Knowledge Questionnaire Score   Pre Score  16/18       Core Components/Risk Factors/Patient Goals at Admission: Personal Goals and Risk Factors at Admission - 03/21/18 1007      Core Components/Risk Factors/Patient Goals on Admission    Weight Management  Weight Loss;Yes    Intervention  Weight Management: Provide education and appropriate resources to help participant work on and attain dietary goals.;Weight Management: Develop a combined nutrition and exercise program designed to reach desired caloric intake, while maintaining appropriate intake of nutrient and fiber, sodium and fats, and appropriate energy expenditure required  for the weight goal.;Weight Management/Obesity: Establish reasonable short term and  long term weight goals.;Obesity: Provide education and appropriate resources to help participant work on and attain dietary goals.    Admit Weight  202 lb 6.1 oz (91.8 kg)    Goal Weight: Short Term  195 lb (88.5 kg)    Goal Weight: Long Term  190 lb (86.2 kg)    Expected Outcomes  Short Term: Continue to assess and modify interventions until short term weight is achieved;Long Term: Adherence to nutrition and physical activity/exercise program aimed toward attainment of established weight goal;Weight Loss: Understanding of general recommendations for a balanced deficit meal plan, which promotes 1-2 lb weight loss per week and includes a negative energy balance of [phone] kcal/d;Understanding recommendations for meals to include 15-35% energy as protein, 25-35% energy from fat, 35-60% energy from carbohydrates, less than '200mg'$  of dietary cholesterol, 20-35 gm of total fiber daily;Understanding of distribution of calorie intake throughout the day with the consumption of 4-5 meals/snacks    Improve shortness of breath with ADL's  Yes    Intervention  Provide education, individualized exercise plan and daily activity instruction to help decrease symptoms of SOB with activities of daily living.    Expected Outcomes  Short Term: Improve cardiorespiratory fitness to achieve a reduction of symptoms when performing ADLs;Long Term: Be able to perform more ADLs without symptoms or delay the onset of symptoms    Heart Failure  Yes    Intervention  Provide a combined exercise and nutrition program that is supplemented with education, support and counseling about heart failure. Directed toward relieving symptoms such as shortness of breath, decreased exercise tolerance, and extremity edema.    Expected Outcomes  Improve functional capacity of life;Short term: Attendance in program 2-3 days a week with increased exercise capacity. Reported lower sodium intake. Reported increased fruit and vegetable intake. Reports  medication compliance.;Long term: Adoption of self-care skills and reduction of barriers for early signs and symptoms recognition and intervention leading to self-care maintenance.    Hypertension  Yes    Intervention  Provide education on lifestyle modifcations including regular physical activity/exercise, weight management, moderate sodium restriction and increased consumption of fresh fruit, vegetables, and low fat dairy, alcohol moderation, and smoking cessation.;Monitor prescription use compliance.    Expected Outcomes  Short Term: Continued assessment and intervention until BP is < 140/83m HG in hypertensive participants. < 130/861mHG in hypertensive participants with diabetes, heart failure or chronic kidney disease.;Long Term: Maintenance of blood pressure at goal levels.    Lipids  Yes    Intervention  Provide education and support for participant on nutrition & aerobic/resistive exercise along with prescribed medications to achieve LDL '70mg'$ , HDL >'40mg'$ .    Expected Outcomes  Short Term: Participant states understanding of desired cholesterol values and is compliant with medications prescribed. Participant is following exercise prescription and nutrition guidelines.;Long Term: Cholesterol controlled with medications as prescribed, with individualized exercise RX and with personalized nutrition plan. Value goals: LDL < '70mg'$ , HDL > 40 mg.       Core Components/Risk Factors/Patient Goals Review:  Goals and Risk Factor Review    Row Name 01/06/18 1009 02/03/18 1026 04/01/18 1311 04/21/18 1631 04/23/18 1153     Core Components/Risk Factors/Patient Goals Review   Personal Goals Review  Improve shortness of breath with ADL's  Improve shortness of breath with ADL's  Weight Management/Obesity;Improve shortness of breath with ADL's;Heart Failure;Hypertension;Lipids  Weight Management/Obesity;Improve shortness of breath with ADL's;Heart Failure  -  Review  patient is doing well in the pulmonary  rehab program. he states he can see a significant improvement in his shortness of breath at home with ADLs and with exercise. he has even started exercising outside of rehab focusing not only on aerobic exercise but stregnth training as well.  patient was doing well in pulmonary rehab until last week when he was admitted to the hospital for multiple falls, dehydration, and URI. He was previously seen by MD for URI and prescribed prednisone with antibiotics. His health continued to decline and he became dehydrated which resulted in several falls with bruising and facial scrapes. He was discharged home yesterday from hospital with HHPT and 24 hour care for safety. He will be discharged from pulmonary rehab and readmitted at a later time once he has recovered from current illness and deconditioning.   First day of exercise today, did well, too early to see progression toward outcomes/goals  Pt has completed 4 exercise sessions.  Pt with fair attendance and pt returns after discharge from pulmonary rehab earlier this year.  Pt with slight weight loss of .3kg since starting on 7/2. Pt reprorts his shortness of breath has not improved.  Hopefully with consistent attendance during the next 30 days, will be able to see some progress.  Pt with bp and lipids within normal limits will resolve these two patietnt goalls.  Pt has completed 4 exercise sessions.  Pt with fair attendance and pt returns after discharge from pulmonary rehab earlier this year.  Pt with slight weight loss of .3kg since starting on 7/2. Pt reprorts his shortness of breath has not improved.  Hopefully with consistent attendance during the next 30 days, will be able to see some progress. Presently pt is on level 4 for nustep and sci fit bike, averages 9-11 laps on the track.  Pt with bp and lipids within normal limits will resolve these two patietnt goalls.   Expected Outcomes  see admission goals  -  see admission goals  see admission goals  see  admission goals/outcomes   [place] Name 05/20/18 1653 05/21/18 0934 06/18/18 1426         Core Components/Risk Factors/Patient Goals Review   Personal Goals Review  Weight Management/Obesity;Improve shortness of breath with ADL's;Heart Failure;Develop more efficient breathing techniques such as purse lipped breathing and diaphragmatic breathing and practicing self-pacing with activity.;Increase knowledge of respiratory medications and ability to use respiratory devices properly.  -  Weight Management/Obesity;Improve shortness of breath with ADL's;Heart Failure;Develop more efficient breathing techniques such as purse lipped breathing and diaphragmatic breathing and practicing self-pacing with activity.;Increase knowledge of respiratory medications and ability to use respiratory devices properly.     Review  Pt has completed 11 exercise sessions.  Pt with fair attendance.  Pt with slight weight loss of .2kg since starting on 7/2. Pt does not routinely weigh himself at home, he goes on how he feels. Presently pt remains on  level 4 for nustep and sci fit bike level 5, averages 10-13 laps on the track.. Continue to monitor pt progress during the next 30 days.  -  Pt has completed 18 exercise sessions and plans to graduate on 9/19.  Pt with weight gain of .2kg since initial exercise session. Pt does not routinely weigh himself at home, he goes on how he feels. Presently pt increased to level 5 for nustep and sci fit bike remains at level 5, averages 10-13 laps on the track.      Expected Outcomes  see admission goals/outcomes  See Admission Goals/Outcomes  See Admission Goals/Outcomes        Core Components/Risk Factors/Patient Goals at Discharge (Final Review):  Goals and Risk Factor Review - 06/18/18 1426      Core Components/Risk Factors/Patient Goals Review   Personal Goals Review  Weight Management/Obesity;Improve shortness of breath with ADL's;Heart Failure;Develop more efficient breathing techniques  such as purse lipped breathing and diaphragmatic breathing and practicing self-pacing with activity.;Increase knowledge of respiratory medications and ability to use respiratory devices properly.    Review  Pt has completed 18 exercise sessions and plans to graduate on 9/19.  Pt with weight gain of .2kg since initial exercise session. Pt does not routinely weigh himself at home, he goes on how he feels. Presently pt increased to level 5 for nustep and sci fit bike remains at level 5, averages 10-13 laps on the track.     Expected Outcomes  See Admission Goals/Outcomes       ITP Comments: ITP Comments    Row Name 03/21/18 0956 04/21/18 1627 05/21/18 0933 06/18/18 1424     ITP Comments  Dr. Jennet Maduro, Medical Director  Dr. Jennet Maduro, Medical Director  Dr. Jennet Maduro, Medical Director  Dr. Jennet Maduro, Medical Director Pulmonary Rehab       Comments: Pt completed 18 exercise sessions. Plans to graduate on 9/19. Cherre Huger, BSN Cardiac and Training and development officer

## 2018-06-19 ENCOUNTER — Encounter (HOSPITAL_COMMUNITY)
Admission: RE | Admit: 2018-06-19 | Discharge: 2018-06-19 | Disposition: A | Discharge status: Home / Self Care | Payer: Commercial Managed Care - PPO | Source: Ambulatory Visit | Attending: Internal Medicine | Admitting: Internal Medicine

## 2018-06-19 DIAGNOSIS — J449 Chronic obstructive pulmonary disease, unspecified: Secondary | ICD-10-CM

## 2018-06-19 NOTE — Progress Notes (Signed)
Daily Session Note  Patient Details  Name: Troy Viscomi [name] MRN: [id number] Date of Birth: 1941/09/10 Referring Provider:     Pulmonary Rehab Walk Test from 03/25/2018 in [place]  Referring Provider  Dr. Melvyn Novas      Encounter Date: 06/19/2018  Check In: Session Check In - 06/19/18 1231      Check-In   Supervising physician immediately available to respond to emergencies  [facility] immediately available    Physician(s)  Dr. Toma Copier    Location  [facility]    Staff Present  Maurice Small, RN, BSN;Molly DiVincenzo, MS, ACSM RCEP, Exercise Physiologist;Dessirae Scarola Ysidro Evert, Felipe Drone, RN, MHA    Medication changes reported      No    Fall or balance concerns reported     No    Tobacco Cessation  No Change    Warm-up and Cool-down  Performed as group-led instruction    Resistance Training Performed  Yes    VAD Patient?  No    PAD/SET Patient?  No      Pain Assessment   Currently in Pain?  No/denies    Multiple Pain Sites  No       Capillary Blood Glucose: No results found for this or any previous visit (from the past 24 hour(s)).    Social History   Tobacco Use  Smoking Status Former Smoker  . Packs/day: 0.50  . Years: 50.00  . Pack years: 25.00  . Types: Cigarettes  . Last attempt to quit: 07/29/2011  . Years since quitting: 6.8  Smokeless Tobacco Never Used    Goals Met:  Exercise tolerated well No report of cardiac concerns or symptoms Strength training completed today  Goals Unmet:  Not Applicable  Comments: Service time is from 1030 to 1220 Attended the Sedentary Lifestyle class.    Dr. Rush Farmer is Medical Director for Pulmonary Rehab at [facility].

## 2018-06-19 NOTE — Progress Notes (Signed)
Carelink Summary Report / Loop Recorder 

## 2018-06-23 ENCOUNTER — Emergency Department (HOSPITAL_COMMUNITY): Payer: Commercial Managed Care - PPO

## 2018-06-23 ENCOUNTER — Other Ambulatory Visit: Payer: Self-pay | Admitting: Internal Medicine

## 2018-06-23 ENCOUNTER — Other Ambulatory Visit: Payer: Self-pay

## 2018-06-23 ENCOUNTER — Encounter (HOSPITAL_COMMUNITY): Payer: Self-pay | Admitting: Emergency Medicine

## 2018-06-23 ENCOUNTER — Observation Stay (HOSPITAL_COMMUNITY)
Admission: EM | Admit: 2018-06-23 | Discharge: 2018-06-26 | Disposition: A | Discharge status: Home / Self Care | Payer: Commercial Managed Care - PPO | Attending: Internal Medicine | Admitting: Internal Medicine

## 2018-06-23 DIAGNOSIS — R42 Dizziness and giddiness: Secondary | ICD-10-CM | POA: Diagnosis present

## 2018-06-23 DIAGNOSIS — Z79899 Other long term (current) drug therapy: Secondary | ICD-10-CM | POA: Insufficient documentation

## 2018-06-23 DIAGNOSIS — G4730 Sleep apnea, unspecified: Secondary | ICD-10-CM | POA: Diagnosis not present

## 2018-06-23 DIAGNOSIS — D508 Other iron deficiency anemias: Secondary | ICD-10-CM | POA: Insufficient documentation

## 2018-06-23 DIAGNOSIS — Z7951 Long term (current) use of inhaled steroids: Secondary | ICD-10-CM | POA: Insufficient documentation

## 2018-06-23 DIAGNOSIS — K5730 Diverticulosis of large intestine without perforation or abscess without bleeding: Secondary | ICD-10-CM | POA: Diagnosis not present

## 2018-06-23 DIAGNOSIS — K640 First degree hemorrhoids: Secondary | ICD-10-CM | POA: Diagnosis not present

## 2018-06-23 DIAGNOSIS — I10 Essential (primary) hypertension: Secondary | ICD-10-CM | POA: Diagnosis present

## 2018-06-23 DIAGNOSIS — D122 Benign neoplasm of ascending colon: Secondary | ICD-10-CM | POA: Diagnosis not present

## 2018-06-23 DIAGNOSIS — Z85118 Personal history of other malignant neoplasm of bronchus and lung: Secondary | ICD-10-CM | POA: Insufficient documentation

## 2018-06-23 DIAGNOSIS — I2510 Atherosclerotic heart disease of native coronary artery without angina pectoris: Secondary | ICD-10-CM | POA: Diagnosis not present

## 2018-06-23 DIAGNOSIS — Z7901 Long term (current) use of anticoagulants: Secondary | ICD-10-CM | POA: Insufficient documentation

## 2018-06-23 DIAGNOSIS — I130 Hypertensive heart and chronic kidney disease with heart failure and stage 1 through stage 4 chronic kidney disease, or unspecified chronic kidney disease: Secondary | ICD-10-CM | POA: Insufficient documentation

## 2018-06-23 DIAGNOSIS — H409 Unspecified glaucoma: Secondary | ICD-10-CM | POA: Diagnosis not present

## 2018-06-23 DIAGNOSIS — D124 Benign neoplasm of descending colon: Secondary | ICD-10-CM | POA: Diagnosis not present

## 2018-06-23 DIAGNOSIS — G4733 Obstructive sleep apnea (adult) (pediatric): Secondary | ICD-10-CM | POA: Diagnosis not present

## 2018-06-23 DIAGNOSIS — K922 Gastrointestinal hemorrhage, unspecified: Secondary | ICD-10-CM | POA: Diagnosis not present

## 2018-06-23 DIAGNOSIS — N183 Chronic kidney disease, stage 3 unspecified: Secondary | ICD-10-CM | POA: Diagnosis present

## 2018-06-23 DIAGNOSIS — I714 Abdominal aortic aneurysm, without rupture, unspecified: Secondary | ICD-10-CM | POA: Diagnosis present

## 2018-06-23 DIAGNOSIS — I5032 Chronic diastolic (congestive) heart failure: Secondary | ICD-10-CM | POA: Diagnosis present

## 2018-06-23 DIAGNOSIS — K8020 Calculus of gallbladder without cholecystitis without obstruction: Secondary | ICD-10-CM | POA: Insufficient documentation

## 2018-06-23 DIAGNOSIS — C187 Malignant neoplasm of sigmoid colon: Secondary | ICD-10-CM | POA: Diagnosis not present

## 2018-06-23 DIAGNOSIS — I739 Peripheral vascular disease, unspecified: Secondary | ICD-10-CM | POA: Insufficient documentation

## 2018-06-23 DIAGNOSIS — F329 Major depressive disorder, single episode, unspecified: Secondary | ICD-10-CM | POA: Insufficient documentation

## 2018-06-23 DIAGNOSIS — J439 Emphysema, unspecified: Secondary | ICD-10-CM | POA: Insufficient documentation

## 2018-06-23 DIAGNOSIS — Z8673 Personal history of transient ischemic attack (TIA), and cerebral infarction without residual deficits: Secondary | ICD-10-CM | POA: Insufficient documentation

## 2018-06-23 DIAGNOSIS — K210 Gastro-esophageal reflux disease with esophagitis: Secondary | ICD-10-CM | POA: Diagnosis not present

## 2018-06-23 DIAGNOSIS — R531 Weakness: Secondary | ICD-10-CM

## 2018-06-23 DIAGNOSIS — Z950 Presence of cardiac pacemaker: Secondary | ICD-10-CM | POA: Insufficient documentation

## 2018-06-23 DIAGNOSIS — D123 Benign neoplasm of transverse colon: Secondary | ICD-10-CM | POA: Diagnosis not present

## 2018-06-23 DIAGNOSIS — Z95820 Peripheral vascular angioplasty status with implants and grafts: Secondary | ICD-10-CM | POA: Insufficient documentation

## 2018-06-23 DIAGNOSIS — Z87891 Personal history of nicotine dependence: Secondary | ICD-10-CM | POA: Insufficient documentation

## 2018-06-23 DIAGNOSIS — D62 Acute posthemorrhagic anemia: Secondary | ICD-10-CM | POA: Diagnosis not present

## 2018-06-23 DIAGNOSIS — E785 Hyperlipidemia, unspecified: Secondary | ICD-10-CM | POA: Diagnosis not present

## 2018-06-23 DIAGNOSIS — I480 Paroxysmal atrial fibrillation: Secondary | ICD-10-CM | POA: Diagnosis not present

## 2018-06-23 DIAGNOSIS — D649 Anemia, unspecified: Secondary | ICD-10-CM

## 2018-06-23 DIAGNOSIS — K2950 Unspecified chronic gastritis without bleeding: Secondary | ICD-10-CM | POA: Insufficient documentation

## 2018-06-23 DIAGNOSIS — Z86010 Personal history of colonic polyps: Secondary | ICD-10-CM | POA: Insufficient documentation

## 2018-06-23 DIAGNOSIS — Z85828 Personal history of other malignant neoplasm of skin: Secondary | ICD-10-CM | POA: Insufficient documentation

## 2018-06-23 LAB — CUP PACEART REMOTE DEVICE CHECK
Date Time Interrogation Session: 20190817020641
Implantable Pulse Generator Implant Date: 20161111

## 2018-06-23 LAB — RAPID URINE DRUG SCREEN, HOSP PERFORMED
Amphetamines: NOT DETECTED
BENZODIAZEPINES: NOT DETECTED
Barbiturates: NOT DETECTED
COCAINE: NOT DETECTED
Opiates: NOT DETECTED
Tetrahydrocannabinol: NOT DETECTED

## 2018-06-23 LAB — I-STAT TROPONIN, ED: TROPONIN I, POC: 0.01 ng/mL (ref 0.00–0.08)

## 2018-06-23 LAB — COMPREHENSIVE METABOLIC PANEL
ALT: 12 U/L (ref 0–44)
AST: 19 U/L (ref 15–41)
Albumin: 3.2 g/dL — ABNORMAL LOW (ref 3.5–5.0)
Alkaline Phosphatase: 61 U/L (ref 38–126)
Anion gap: 7 (ref 5–15)
BUN: 14 mg/dL (ref 8–23)
CHLORIDE: 108 mmol/L (ref 98–111)
CO2: 26 mmol/L (ref 22–32)
Calcium: 9.3 mg/dL (ref 8.9–10.3)
Creatinine, Ser: 1.36 mg/dL — ABNORMAL HIGH (ref 0.61–1.24)
GFR calc non Af Amer: 49 mL/min — ABNORMAL LOW (ref >60)
GFR, EST AFRICAN AMERICAN: 56 mL/min — AB (ref >60)
Glucose, Bld: 124 mg/dL — ABNORMAL HIGH (ref 70–99)
POTASSIUM: 4.3 mmol/L (ref 3.5–5.1)
Sodium: 141 mmol/L (ref 135–145)
TOTAL PROTEIN: 6.7 g/dL (ref 6.5–8.1)
Total Bilirubin: 0.4 mg/dL (ref 0.3–1.2)

## 2018-06-23 LAB — DIFFERENTIAL
ABS IMMATURE GRANULOCYTES: 0 10*3/uL (ref 0.0–0.1)
BASOS PCT: 1 %
Basophils Absolute: 0 10*3/uL (ref 0.0–0.1)
EOS ABS: 0.1 10*3/uL (ref 0.0–0.7)
Eosinophils Relative: 2 %
IMMATURE GRANULOCYTES: 0 %
Lymphocytes Relative: 17 %
Lymphs Abs: 0.9 10*3/uL (ref 0.7–4.0)
MONOS PCT: 10 %
Monocytes Absolute: 0.5 10*3/uL (ref 0.1–1.0)
NEUTROS PCT: 70 %
Neutro Abs: 3.8 10*3/uL (ref 1.7–7.7)

## 2018-06-23 LAB — CBC
HCT: 35.2 % — ABNORMAL LOW (ref 39.0–52.0)
Hemoglobin: 10.7 g/dL — ABNORMAL LOW (ref 13.0–17.0)
MCH: 27.6 pg (ref 26.0–34.0)
MCHC: 30.4 g/dL (ref 30.0–36.0)
MCV: 90.7 fL (ref 78.0–100.0)
PLATELETS: 265 10*3/uL (ref 150–400)
RBC: 3.88 MIL/uL — AB (ref 4.22–5.81)
RDW: 13.9 % (ref 11.5–15.5)
WBC: 5.4 10*3/uL (ref 4.0–10.5)

## 2018-06-23 LAB — I-STAT CHEM 8, ED
BUN: 16 mg/dL (ref 8–23)
CHLORIDE: 107 mmol/L (ref 98–111)
Calcium, Ion: 1.25 mmol/L (ref 1.15–1.40)
Creatinine, Ser: 1.5 mg/dL — ABNORMAL HIGH (ref 0.61–1.24)
Glucose, Bld: 119 mg/dL — ABNORMAL HIGH (ref 70–99)
HEMATOCRIT: 31 % — AB (ref 39.0–52.0)
Hemoglobin: 10.5 g/dL — ABNORMAL LOW (ref 13.0–17.0)
POTASSIUM: 4.2 mmol/L (ref 3.5–5.1)
SODIUM: 141 mmol/L (ref 135–145)
TCO2: 26 mmol/L (ref 22–32)

## 2018-06-23 LAB — URINALYSIS, ROUTINE W REFLEX MICROSCOPIC
Bilirubin Urine: NEGATIVE
Glucose, UA: NEGATIVE mg/dL
HGB URINE DIPSTICK: NEGATIVE
Ketones, ur: NEGATIVE mg/dL
LEUKOCYTES UA: NEGATIVE
Nitrite: NEGATIVE
Protein, ur: NEGATIVE mg/dL
SPECIFIC GRAVITY, URINE: 1.013 (ref 1.005–1.030)
pH: 5 (ref 5.0–8.0)

## 2018-06-23 LAB — ETHANOL: Alcohol, Ethyl (B): <10 mg/dL

## 2018-06-23 LAB — PROTIME-INR
INR: 1.22
PROTHROMBIN TIME: 15.3 s — AB (ref 11.4–15.2)

## 2018-06-23 LAB — APTT: aPTT: 32 seconds (ref 24–36)

## 2018-06-23 LAB — POC OCCULT BLOOD, ED: FECAL OCCULT BLD: POSITIVE — AB

## 2018-06-23 MED ORDER — ONDANSETRON HCL 4 MG/2ML IJ SOLN: 4.0000 mg | Freq: Four times a day (QID) | INTRAMUSCULAR | Status: DC | PRN

## 2018-06-23 MED ORDER — AMIODARONE HCL 100 MG PO TABS
100.0000 mg | ORAL_TABLET | Freq: Every day | ORAL | Status: DC
  Administered 2018-06-24 – 2018-06-26 (×2): 100 mg via ORAL
  Filled 2018-06-23 (×3): qty 1

## 2018-06-23 MED ORDER — FLUOXETINE HCL 20 MG PO CAPS
20.0000 mg | ORAL_CAPSULE | Freq: Every day | ORAL | Status: DC
  Administered 2018-06-24 – 2018-06-26 (×3): 20 mg via ORAL
  Filled 2018-06-23 (×3): qty 1

## 2018-06-23 MED ORDER — MELATONIN 3 MG PO TABS
6.0000 mg | ORAL_TABLET | Freq: Every day | ORAL | Status: DC
  Administered 2018-06-24 – 2018-06-25 (×3): 6 mg via ORAL
  Filled 2018-06-23 (×3): qty 2

## 2018-06-23 MED ORDER — ACETAMINOPHEN 325 MG PO TABS
650.0000 mg | ORAL_TABLET | Freq: Four times a day (QID) | ORAL | Status: DC | PRN
  Administered 2018-06-25: 650 mg via ORAL
  Filled 2018-06-23: qty 2

## 2018-06-23 MED ORDER — MAGNESIUM OXIDE 400 (241.3 MG) MG PO TABS
400.0000 mg | ORAL_TABLET | Freq: Every day | ORAL | Status: DC
  Administered 2018-06-24 – 2018-06-25 (×3): 400 mg via ORAL
  Filled 2018-06-23 (×3): qty 1

## 2018-06-23 MED ORDER — ROSUVASTATIN CALCIUM 20 MG PO TABS
40.0000 mg | ORAL_TABLET | Freq: Every day | ORAL | Status: DC
  Administered 2018-06-24 – 2018-06-25 (×3): 40 mg via ORAL
  Filled 2018-06-23 (×3): qty 2

## 2018-06-23 MED ORDER — LATANOPROST 0.005 % OP SOLN
1.0000 [drp] | Freq: Every day | OPHTHALMIC | Status: DC
  Administered 2018-06-24 – 2018-06-25 (×3): 1 [drp] via OPHTHALMIC
  Filled 2018-06-23: qty 2.5

## 2018-06-23 MED ORDER — ACETAMINOPHEN 650 MG RE SUPP: 650.0000 mg | Freq: Four times a day (QID) | RECTAL | Status: DC | PRN

## 2018-06-23 MED ORDER — ONDANSETRON HCL 4 MG PO TABS: 4.0000 mg | ORAL_TABLET | Freq: Four times a day (QID) | ORAL | Status: DC | PRN

## 2018-06-23 MED ORDER — DORZOLAMIDE HCL-TIMOLOL MAL 2-0.5 % OP SOLN
2.0000 [drp] | Freq: Two times a day (BID) | OPHTHALMIC | Status: DC
  Administered 2018-06-24 – 2018-06-26 (×5): 2 [drp] via OPHTHALMIC
  Filled 2018-06-23: qty 10

## 2018-06-23 MED ORDER — MONTELUKAST SODIUM 10 MG PO TABS
10.0000 mg | ORAL_TABLET | Freq: Every day | ORAL | Status: DC
  Administered 2018-06-24 – 2018-06-25 (×3): 10 mg via ORAL
  Filled 2018-06-23 (×3): qty 1

## 2018-06-23 NOTE — ED Notes (Signed)
Handed pt urine specimen packet but he states he has just went but if he goes again he'll bring it over to me

## 2018-06-23 NOTE — Progress Notes (Signed)
Per Dr Glenis Smoker does not need swallow screen

## 2018-06-23 NOTE — H&P (Addendum)
History and Physical    Troy Diamant [name] IWL:[id number] DOB: 1940-12-26 DOA: 06/23/2018  PCP: Binnie Rail, MD  Patient coming from: Home.  Chief Complaint: Weakness.  HPI: Troy Cush [name] is a 77 y.o. male with history of paroxysmal atrial fibrillation, hypertension, TIA.  History of carotid endarterectomy, abdominal aortic aneurysm who was brought to the ER after patient was complaining of increasing weakness.  Patient states yesterday at home while walking he felt dizzy and fell onto the bushes.  He felt weak generalized but did not lose function of the hands or leg.  He was able to regain strength and walked up to his house.  Did not have any chest pain shortness of breath nausea vomiting abdominal pain.  Has been noticing dark stools last few days.  Denies taking any NSAIDs.  Since patient was not still feeling well patient presented to the ER.  ED Course: In the ER patient hemoglobin was found to be around and stool for Hemoccult was positive.  On-call gastroenterologist fellow bowel was consulted by ER physician.  They will be seeing patient in the morning.  EKG shows A. fib with rate around 49 bpm.  Patient has been admitted for GI bleed and generalized weakness.  MRI of the brain was negative for any acute changes.  On exam patient is nonfocal.  Review of Systems: As per HPI, rest all negative.   Past Medical History:  Diagnosis Date  . AAA (abdominal aortic aneurysm) (HCC) LAST ABD. [name]  JAN 2014  3.4CM   MONITORED BY DR [name]  . Arthritis   . Atrial fibrillation ([place])   . Benign essential HTN 01/19/2016  . Bilateral carotid artery stenosis DUPLEX 12-29-2012  BY DR [facility]   BILATERAL ICA STENOSIS 60-79%  . BPH (benign prostatic hypertrophy)   . Cancer ([place]) 01-25-12   LUNG  . Complication of anesthesia    had nausea after carotid artery surgery, states the medicine given to help the nausea, made it worse  . COPD (chronic obstructive pulmonary disease) ([place])   . Exertional  dyspnea   . Frequency of urination   . GERD (gastroesophageal reflux disease)   . Glaucoma BOTH EYES   Dr Gershon Crane  . Glaucoma   . History of basal cell carcinoma excision   . History of lung cancer APRIL 2012  SQUAMOUS CELL---- S/P RIGHT LOWER LOBECTOMY AT [facility] --  NO CHEMORADIATION---  NO RECURRENCE    ONCOLOGIST- DR Tressie Stalker  LOV IN [facility] 10-27-2012  . Hx of adenomatous colonic polyps 2005    X 2; 1 hyperplastic polyp; Dr Olevia Perches  . Hyperlipidemia   . Hypertension   . Impaired fasting glucose 2007   108; A1c5.4%  . Lesion of bladder   . Microhematuria   . Nocturia   . OSA (obstructive sleep apnea) 08/29/2015   Mild with AHI overall 6/hr but severe during REM sleep with AHI 30/hr with oxygen saturations < 88% for 5.5 minutes of total sleep time.    [name] PAD (peripheral artery disease) ([place]) ABI'S  JAN 2014  0.65 ON RIGHT ;  1.04 ON LEFT  . Peripheral vascular disease ([place]) S/P ANGIOPLASTY AND STENTING   FOLLOWED  BY DR [name]  . Spinal stenosis Sept. 2015  . Stroke [facility]) Jul 07, 2013   mini  TIA  . Urgency of urination     Past Surgical History:  Procedure Laterality Date  . ANGIO PLASTY     X 4 in legs  . AORTOGRAM  07-27-2002   MILD DIFFUSE ILIAC ARTERY OCCLUSIVE DISEASE /  LEFT RENAL ARTERY 20%/ PATENT LEFT FEM-POP GRAFT/ MILD SFA AND POPLITEAL ARTERY OCCLUSIVE DISEASE W/ SEVERE KIDNEY OCCLUSIVE DISEASE  . BASAL CELL CARCINOMA EXCISION     MULTIPLE TIMES--  RIGHT FOREARM, CHEEKS, AND BACK  . CARDIOVASCULAR STRESS TEST  12-08-2012  DR MCLEAN   NORMAL LEXISCAN WITH NO EXERCISE NUCLEAR STUDY/ EF 66%/   NO ISCHEMIA/ NO SIGNIFICANT CHANGE FROM PRIOR STUDY  . CARDIOVERSION N/A 02/14/2018   Procedure: CARDIOVERSION;  Surgeon: Larey Dresser, MD;  Location: [facility] ENDOSCOPY;  Service: Cardiovascular;  Laterality: N/A;  . CAROTID ANGIOGRAM N/A 07/10/2013   Procedure: CAROTID ANGIOGRAM;  Surgeon: Elam Dutch, MD;  Location: [facility] CATH LAB;  Service: Cardiovascular;  Laterality: N/A;    . CAROTID ENDARTERECTOMY Right 07-14-13   cea  . CATARACT EXTRACTION W/ INTRAOCULAR LENS  IMPLANT, BILATERAL    . colon polyectomy    . CYSTOSCOPY W/ RETROGRADES Bilateral 01/21/2013   Procedure: CYSTOSCOPY WITH RETROGRADE PYELOGRAM;  Surgeon: Molli Hazard, MD;  Location: [facility];  Service: Urology;  Laterality: Bilateral;   CYSTO, BLADDER BIOPSY, BILATERAL RETROGRADE PYELOGRAM  RAD TECH FROM RADIOLOGY PER JOY  . CYSTOSCOPY WITH BIOPSY N/A 01/21/2013   Procedure: CYSTOSCOPY WITH BIOPSY;  Surgeon: Molli Hazard, MD;  Location[facility];  Service: Urology;  Laterality: N/A;  . ENDARTERECTOMY Right 07/14/2013   Procedure: ENDARTERECTOMY CAROTID;  Surgeon: Angelia Mould, MD;  Location: [place];  Service: Vascular;  Laterality: Right;  . EP IMPLANTABLE DEVICE N/A 08/12/2015   Procedure: Loop Recorder Insertion;  Surgeon: Thompson Grayer, MD;  Location: [place] CV LAB;  Service: Cardiovascular;  Laterality: N/A;  . FEMORAL-POPLITEAL BYPASS GRAFT Left 1994  [facility]   AND 2001  IN [place]  . FEMORAL-POPLITEAL BYPASS GRAFT Left 08/30/2015   Procedure: REVISION OF BYPASS GRAFT Left  FEMORAL-POPLITEAL ARTERY;  Surgeon: Angelia Mould, MD;  Location: [place];  Service: Vascular;  Laterality: Left;  . LARYNGOSCOPY  06-27-2004   BX VOCAL CORD  (LEUKOPLAKIA)  PER PT NO ISSUES SINCE  . LEFT HEART CATH AND CORONARY ANGIOGRAPHY N/A 08/20/2017   Procedure: LEFT HEART CATH AND CORONARY ANGIOGRAPHY;  Surgeon: Larey Dresser, MD;  Location: [place] CV LAB;  Service: Cardiovascular;  Laterality: N/A;  . LOWER EXTREMITY ANGIOGRAM Bilateral 08/29/2015   Procedure: Lower Extremity Angiogram;  Surgeon: Angelia Mould, MD;  Location: [place] CV LAB;  Service: Cardiovascular;  Laterality: Bilateral;  . LUNG LOBECTOMY  01/24/2011    RIGHT UPPER LOBE  (SQUAMOUS CELL CARCINOMA) Dr Dorthea [place] , [facility]. No chemotherapyor radiation  . PATCH  ANGIOPLASTY Right 07/14/2013   Procedure: PATCH ANGIOPLASTY;  Surgeon: Angelia Mould, MD;  Location: [place];  Service: Vascular;  Laterality: Right;  . PATCH ANGIOPLASTY Left 08/30/2015   Procedure: VEIN PATCH ANGIOPLASTY OF PROXIMAL Left BYPASS GRAFT;  Surgeon: Angelia Mould, MD;  Location: [place];  Service: Vascular;  Laterality: Left;  . PERIPHERAL VASCULAR CATHETERIZATION N/A 08/29/2015   Procedure: Abdominal Aortogram;  Surgeon: Angelia Mould, MD;  Location: [place] CV LAB;  Service: Cardiovascular;  Laterality: N/A;  . TEE WITHOUT CARDIOVERSION N/A 02/14/2018   Procedure: TRANSESOPHAGEAL ECHOCARDIOGRAM (TEE);  Surgeon: Larey Dresser, MD;  Location[facility] ENDOSCOPY;  Service: Cardiovascular;  Laterality: N/A;  . trabecular surgery     OS  . TRANSTHORACIC ECHOCARDIOGRAM  12-29-2012  DR [name]   MILD LVH/  LVSF  NORMAL/ EF 44-31%/  GRADE I DIASTOLIC DYSFUNCTION     reports that he quit smoking about 6 years ago. His smoking use included cigarettes. He has a 25.00 pack-year smoking history. He has never used smokeless tobacco. He reports that he drinks alcohol. He reports that he does not use drugs.  Allergies  Allergen Reactions  . Niacin-Lovastatin Er Shortness Of Breath and Other (See Comments)    Dyspnea, flushing  . Penicillins Hives, Shortness Of Breath and Other (See Comments)    Flushing  & dyspnea Because of a history of documented adverse serious drug reaction;Medi Alert bracelet  is recommended PCN reaction causing immediate rash, facial/tongue/throat swelling, SOB or lightheadedness with hypotension: Yes PCN reaction causing severe rash involving mucus membranes or skin necrosis: No PCN reaction occurring within the last 10 years: NO PCN reaction that required hospitalization: NO  . Sulfonamide Derivatives Hives, Shortness Of Breath and Other (See Comments)    Flushing & dyspnea Because of a history of documented adverse serious drug reaction;Medi  Alert bracelet  is recommended  . Atorvastatin Other (See Comments)    Myalgias & athralgias- takes Crestor    Family History  Problem Relation Age of Onset  . Stroke Mother        mini strokes  . Alcohol abuse Father   . Heart disease Father        MI after 41  . Stroke Father   . Hypertension Father   . Heart attack Father   . Heart disease Paternal Aunt        several  . Hypertension Paternal Aunt        several  . Stroke Paternal Aunt        several  . Stroke Paternal Uncle        several  . Heart disease Paternal Uncle        several;2 had MI pre 4    Prior to Admission medications   Medication Sig Start Date End Date Taking? Authorizing Provider  acetaminophen (TYLENOL) 325 MG tablet Take 650 mg every 6 (six) hours as needed by mouth for moderate pain or headache.   Yes [provider]  amiodarone (PACERONE) 200 MG tablet TAKE 1/2 TABLET EVERY DAY. Patient taking differently: Take 100 mg by mouth daily.  01/21/18  Yes Larey Dresser, MD  bimatoprost (LUMIGAN) 0.01 % SOLN Place 1 drop at bedtime into both eyes.    Yes [provider]  Biotin 10000 MCG TABS Take 10,000 mcg daily by mouth.    Yes [provider]  dorzolamide-timolol (COSOPT) 22.3-6.8 MG/ML ophthalmic solution Place 2 drops into both eyes 2 (two) times daily.    Yes [provider]  ELIQUIS 5 MG TABS tablet TAKE 1 TABLET BY MOUTH TWICE DAILY. 04/14/18  Yes Larey Dresser, MD  FLUoxetine (PROZAC) 20 MG capsule TAKE (1) CAPSULE DAILY. Patient taking differently: Take 20 mg by mouth daily.  06/23/18  Yes Burns, Claudina Lick, MD  furosemide (LASIX) 20 MG tablet Take 1 tablet (20 mg total) by mouth daily. 02/05/18 02/05/19 Yes Sherran Needs, NP  iron polysaccharides (POLY-IRON 150) 150 MG capsule Take 150 mg by mouth at bedtime.    Yes [provider]  Magnesium 500 MG CAPS Take 500 mg by mouth at bedtime.   Yes [provider]  Melatonin 5 MG TABS Take 5 mg by  mouth at bedtime.   Yes [provider]  montelukast (SINGULAIR) 10 MG tablet Take 1  tablet (10 mg total) by mouth at bedtime. 02/10/18  Yes Burns, Claudina Lick, MD  Multiple Vitamins-Minerals (CENTRUM SILVER 50+MEN) TABS Take 1 tablet by mouth at bedtime.   Yes [provider]  Polyethyl Glycol-Propyl Glycol (SYSTANE OP) Place 1-2 drops as needed into both eyes (for dry eyes).   Yes [provider]  potassium chloride SA (K-DUR,KLOR-CON) 20 MEQ tablet TAKE 1 TABLET ONCE DAILY. Patient taking differently: Take 20 mEq by mouth daily.  04/21/18  Yes Larey Dresser, MD  ranitidine (ZANTAC) 150 MG tablet Take 150 mg by mouth daily as needed for heartburn.    Yes [provider]  rosuvastatin (CRESTOR) 40 MG tablet TAKE 1 TABLET ONCE DAILY. Patient taking differently: Take 40 mg by mouth at bedtime.  01/08/18  Yes Larey Dresser, MD  budesonide-formoterol [facility]) 160-4.5 MCG/ACT inhaler Inhale 2 puffs into the lungs 2 (two) times daily. Patient not taking: Reported on 06/23/2018 01/20/18   Chesley Mires, MD  CARDURA 4 MG tablet TAKE (1/2) TABLET DAILY. Patient not taking: No sig reported 10/16/17   Binnie Rail, MD    Physical Exam: Vitals:   06/23/18 1818 06/23/18 1820 06/23/18 2152 06/23/18 2358  BP:  (!) 180/66 (!) 147/78 (!) 136/54  Pulse:  (!) 47 (!) 48 (!) 48  Resp:  20 18 16   Temp:   (!) 97.5 F (36.4 C) 98.2 F (36.8 C)  TempSrc:   Oral Oral  SpO2:  97% 99% 97%  Weight: 90.7 kg  90.9 kg   Height: 6\' 1"  (1.854 m)  6\' 1"  (1.854 m)       Constitutional: Moderately built and nourished. Vitals:   06/23/18 1818 06/23/18 1820 06/23/18 2152 06/23/18 2358  BP:  (!) 180/66 (!) 147/78 (!) 136/54  Pulse:  (!) 47 (!) 48 (!) 48  Resp:  20 18 16   Temp:   (!) 97.5 F (36.4 C) 98.2 F (36.8 C)  TempSrc:   Oral Oral  SpO2:  97% 99% 97%  Weight: 90.7 kg  90.9 kg   Height: 6\' 1"  (1.854 m)  6\' 1"  (1.854 m)    Eyes: Anicteric no pallor. ENMT: No discharge  from the ears eyes nose or mouth. Neck: No mass felt.  No neck rigidity. Respiratory: No rhonchi or crepitations. Cardiovascular: S1-S2 heard no murmurs appreciated. Abdomen: Soft nontender bowel sounds present. Musculoskeletal: No edema.  No joint effusion. Skin: No rash.  Skin appears warm. Neurologic: Alert awake oriented to time place and person.  Moves all extremities 5 x 5 no facial asymmetry tongue is midline pupils equal and reacting to light. Psychiatric: Appears normal per normal affect.   Labs on Admission: I have personally reviewed following labs and imaging studies  CBC: Recent Labs  Lab 06/23/18 1449 06/23/18 1502  WBC 5.4  --   NEUTROABS 3.8  --   HGB 10.7* 10.5*  HCT 35.2* 31.0*  MCV 90.7  --   PLT 265  --    Basic Metabolic Panel: Recent Labs  Lab 06/23/18 1449 06/23/18 1502  NA 141 141  K 4.3 4.2  CL 108 107  CO2 26  --   GLUCOSE 124* 119*  BUN 14 16  CREATININE 1.36* 1.50*  CALCIUM 9.3  --    GFR: Estimated Creatinine Clearance: 46.6 mL/min (A) (by C-G formula based on SCr of 1.5 mg/dL (H)). Liver Function Tests: Recent Labs  Lab 06/23/18 1449  AST 19  ALT 12  ALKPHOS 61  BILITOT 0.4  PROT 6.7  ALBUMIN 3.2*   No results for input(s): LIPASE, AMYLASE in the last 168 hours. No results for input(s): AMMONIA in the last 168 hours. Coagulation Profile: Recent Labs  Lab 06/23/18 1449  INR 1.22   Cardiac Enzymes: No results for input(s): CKTOTAL, CKMB, CKMBINDEX, TROPONINI in the last 168 hours. BNP (last 3 results) No results for input(s): PROBNP in the last 8760 hours. HbA1C: No results for input(s): HGBA1C in the last 72 hours. CBG: No results for input(s): GLUCAP in the last 168 hours. Lipid Profile: No results for input(s): CHOL, HDL, LDLCALC, TRIG, CHOLHDL, LDLDIRECT in the last 72 hours. Thyroid Function Tests: No results for input(s): TSH, T4TOTAL, FREET4, T3FREE, THYROIDAB in the last 72 hours. Anemia Panel: No results for  input(s): VITAMINB12, FOLATE, FERRITIN, TIBC, IRON, RETICCTPCT in the last 72 hours. Urine analysis:    Component Value Date/Time   COLORURINE YELLOW 06/23/2018 1853   APPEARANCEUR CLEAR 06/23/2018 1853   LABSPEC 1.013 06/23/2018 1853   PHURINE 5.0 06/23/2018 1853   GLUCOSEU NEGATIVE 06/23/2018 1853   HGBUR NEGATIVE 06/23/2018 1853   BILIRUBINUR NEGATIVE 06/23/2018 1853   KETONESUR NEGATIVE 06/23/2018 1853   PROTEINUR NEGATIVE 06/23/2018 1853   UROBILINOGEN 1.0 07/10/2013 1500   NITRITE NEGATIVE 06/23/2018 1853   LEUKOCYTESUR NEGATIVE 06/23/2018 1853   Sepsis Labs: @LABRCNTIP (procalcitonin:4,lacticidven:4) )No results found for this or any previous visit (from the past 240 hour(s)).   Radiological Exams on Admission: Ct Head Wo Contrast  Result Date: 06/23/2018 CLINICAL DATA:  77 year old male with a history of dizziness EXAM: CT HEAD WITHOUT CONTRAST TECHNIQUE: Contiguous axial images were obtained from the base of the skull through the vertex without intravenous contrast. COMPARISON:  01/29/2018, 06/22/2015, MR 07/29/2015 FINDINGS: Brain: No acute intracranial hemorrhage. No midline shift or mass effect. Gray-white differentiation maintained. Unremarkable appearance of the ventricular system. Vascular: Calcifications of the intracranial vasculature. Skull: No acute fracture.  No aggressive bone lesion identified. Sinuses/Orbits: Bilateral globe/scleral surgery. Mastoid air cells clear. No middle ear effusion. No significant sinus disease. Other: None IMPRESSION: Negative for acute intracranial abnormality. Electronically Signed   By: Corrie Mckusick D.O.   On: 06/23/2018 15:43   Mr Brain Wo Contrast  Result Date: 06/23/2018 CLINICAL DATA:  Focal neuro deficit, > 6 hrs, stroke suspected EXAM: MRI HEAD WITHOUT CONTRAST TECHNIQUE: Multiplanar, multiecho pulse sequences of the brain and surrounding structures were obtained without intravenous contrast. COMPARISON:  06/23/2018 head CT Brain  MRI 07/29/2015 FINDINGS: BRAIN: There is no acute infarct, acute hemorrhage or mass effect. The midline structures are normal. There are no old infarcts. Multifocal white matter hyperintensity, most commonly due to chronic ischemic microangiopathy. The CSF spaces are normal for age, with no hydrocephalus. Susceptibility-sensitive sequences show no chronic microhemorrhage or superficial siderosis. VASCULAR: Major intracranial arterial and venous sinus flow voids are preserved. SKULL AND UPPER CERVICAL SPINE: The visualized skull base, calvarium, upper cervical spine and extracranial soft tissues are normal. SINUSES/ORBITS: No fluid levels or advanced mucosal thickening. No mastoid or middle ear effusion. The orbits are normal. IMPRESSION: Chronic ischemic microangiopathy without acute intracranial abnormality. Electronically Signed   By: Ulyses Jarred M.D.   On: 06/23/2018 21:01    EKG: Independently reviewed.  A. fib rate around 49 bpm.  Assessment/Plan Principal Problem:   Acute GI bleeding Active Problems:   AAA (abdominal aortic aneurysm) (HCC)   Chronic renal disease, stage 3, moderately decreased glomerular filtration rate between 30-59 mL/min/1.73 square meter (HCC)   PAF (paroxysmal atrial fibrillation) ([place])  OSA (obstructive sleep apnea)   Benign essential HTN   Chronic diastolic CHF (congestive heart failure) (HCC)   Acute blood loss anemia   Weakness generalized    1. GI bleed -we will follow serial CBC.  Type and screen transfuse if hemoglobin less than 7.  I have discussed with patient and patient's wife about holding apixaban.  Patient and patient's wife are in agreement holding apixaban for now.  ER physician as discussed with on-call [facility] who will be seeing patient in the morning.  Patient is placed on Pepcid IV. 2. Generalized weakness -could be either from anemia or could be from bradycardia.  Closely observe heart rate in the telemetry.  Patient on amiodarone.  May have  to hold if there is further bradycardic episodes.  Check TSH and troponin. 3. History of paroxysmal atrial fibrillation on amiodarone.  See #2 with regarding to amiodarone.  Apixaban on hold due to possible GI bleed. 4. Sleep apnea on CPAP. 5. Chronic kidney disease stage III -creatinine appears to be at baseline. 6. Hyperlipidemia on statins. 7. History of abdominal aortic aneurysm denies any abdominal pain. 8. History of COPD -not actively wheezing continue inhalers.   DVT prophylaxis: SCDs. Code Status: Full code. Family Communication: Patient's wife. Disposition Plan: Home. Consults called: Gastroenterologist. Admission status: Observation.   Rise Patience MD [facility] Pager [phone].  If 7PM-7AM, please contact night-coverage [email or web address] Password [facility]  06/23/2018, 11:59 PM

## 2018-06-23 NOTE — ED Provider Notes (Signed)
[name] DEPARTMENT Provider Note   CSN: [id number] Arrival date & time: 06/23/18  1406     History   Chief Complaint Chief Complaint  Patient presents with  . Dizziness  . Fall    HPI Troy Skalla [name] is a 77 y.o. male.  HPI Pt had an episode while walking yesterday.  His legs and arms got weak and started shaking.  He ended up falling into the bushes at his house.  Pt was able to get back to the house.  This morning he was still feeling tired.  He tried to go to his doctor office today but they were unavailable so he was sent to the ED.  No unilateral weakness.  No new issues with his vision or speech.  No CP or abd pain.  Wife feels that he is slower to speak and respond.  Past Medical History:  Diagnosis Date  . AAA (abdominal aortic aneurysm) (HCC) LAST ABD. [name]  JAN 2014  3.4CM   MONITORED BY DR [name]  . Arthritis   . Atrial fibrillation ([place])   . Benign essential HTN 01/19/2016  . Bilateral carotid artery stenosis DUPLEX 12-29-2012  BY DR [facility]   BILATERAL ICA STENOSIS 60-79%  . BPH (benign prostatic hypertrophy)   . Cancer ([place]) 01-25-12   LUNG  . Complication of anesthesia    had nausea after carotid artery surgery, states the medicine given to help the nausea, made it worse  . COPD (chronic obstructive pulmonary disease) ([place])   . Exertional dyspnea   . Frequency of urination   . GERD (gastroesophageal reflux disease)   . Glaucoma BOTH EYES   Dr Gershon Crane  . Glaucoma   . History of basal cell carcinoma excision   . History of lung cancer APRIL 2012  SQUAMOUS CELL---- S/P RIGHT LOWER LOBECTOMY AT [facility] --  NO CHEMORADIATION---  NO RECURRENCE    ONCOLOGIST- DR Tressie Stalker  LOV [name] 10-27-2012  . Hx of adenomatous colonic polyps 2005    X 2; 1 hyperplastic polyp; Dr Olevia Perches  . Hyperlipidemia   . Hypertension   . Impaired fasting glucose 2007   108; A1c5.4%  . Lesion of bladder   . Microhematuria   . Nocturia   . OSA (obstructive  sleep apnea) 08/29/2015   Mild with AHI overall 6/hr but severe during REM sleep with AHI 30/hr with oxygen saturations < 88% for 5.5 minutes of total sleep time.    [name] PAD (peripheral artery disease) ([place]) ABI'S  JAN 2014  0.65 ON RIGHT ;  1.04 ON LEFT  . Peripheral vascular disease ([place]) S/P ANGIOPLASTY AND STENTING   FOLLOWED  BY DR [name]  . Spinal stenosis Sept. 2015  . Stroke [facility]) Jul 07, 2013   mini  TIA  . Urgency of urination     Patient Active Problem List   Diagnosis Date Noted  . Depression 05/06/2018  . Syncope 01/29/2018  . Chronic diastolic CHF (congestive heart failure) ([place]) 05/04/2017  . CAD (coronary artery disease) 05/03/2017  . MGUS (monoclonal gammopathy of unknown significance) 03/07/2016  . Benign essential HTN 01/19/2016  . OSA (obstructive sleep apnea) 08/29/2015  . PAF (paroxysmal atrial fibrillation) ([place])   . Glaucoma 06/22/2015  . Dyspnea 02/04/2015  . Memory loss, short term 08/03/2014  . Anemia, unspecified 12/28/2013  . Occlusion and stenosis of carotid artery without mention of cerebral infarction 07/09/2013  . COPD GOLD II  07/09/2013  . Chronic renal disease, stage 3,  moderately decreased glomerular filtration rate between 30-59 mL/min/1.73 square meter ([place]) 07/06/2013  . TIA (transient ischemic attack) 07/06/2013  . Right carotid bruit 12/01/2012  . Basal cell carcinoma of skin 10/10/2012  . History of lung cancer 03/11/2012  . PAD (peripheral artery disease) ([employer]) 12/16/2011  . AAA (abdominal aortic aneurysm) ([place]) 09/27/2011  . Squamous cell lung cancer ([place]) 11/08/2010  . GAIT IMBALANCE 03/02/2009  . Prediabetes 03/02/2009  . COLONIC POLYPS, HX OF 03/02/2009  . OBSTRUCTIVE CHRONIC BRONCHITIS WITHOUT EXACERBAT 01/15/2008  . HYPERPLASIA PROSTATE UNS W/O UR OBST & OTH LUTS 01/15/2008  . HYPERLIPIDEMIA 09/29/2007  . Peripheral vascular disease ([place]) 09/29/2007  . LEUKOPLAKIA, VOCAL CORDS 09/29/2007  . FEMORAL BRUIT 09/29/2007    Past  Surgical History:  Procedure Laterality Date  . ANGIO PLASTY     X 4 in legs  . AORTOGRAM  07-27-2002   MILD DIFFUSE ILIAC ARTERY OCCLUSIVE DISEASE /  LEFT RENAL ARTERY 20%/ PATENT LEFT FEM-POP GRAFT/ MILD SFA AND POPLITEAL ARTERY OCCLUSIVE DISEASE W/ SEVERE KIDNEY OCCLUSIVE DISEASE  . BASAL CELL CARCINOMA EXCISION     MULTIPLE TIMES--  RIGHT FOREARM, CHEEKS, AND BACK  . CARDIOVASCULAR STRESS TEST  12-08-2012  DR MCLEAN   NORMAL LEXISCAN WITH NO EXERCISE NUCLEAR STUDY/ EF 66%/   NO ISCHEMIA/ NO SIGNIFICANT CHANGE FROM PRIOR STUDY  . CARDIOVERSION N/A 02/14/2018   Procedure: CARDIOVERSION;  Surgeon: Larey Dresser, MD;  Location[facility] ENDOSCOPY;  Service: Cardiovascular;  Laterality: N/A;  . CAROTID ANGIOGRAM N/A 07/10/2013   Procedure: CAROTID ANGIOGRAM;  Surgeon: Elam Dutch, MD;  Location: [facility] CATH LAB;  Service: Cardiovascular;  Laterality: N/A;  . CAROTID ENDARTERECTOMY Right 07-14-13   cea  . CATARACT EXTRACTION W/ INTRAOCULAR LENS  IMPLANT, BILATERAL    . colon polyectomy    . CYSTOSCOPY W/ RETROGRADES Bilateral 01/21/2013   Procedure: CYSTOSCOPY WITH RETROGRADE PYELOGRAM;  Surgeon: Molli Hazard, MD;  Location: [facility];  Service: Urology;  Laterality: Bilateral;   CYSTO, BLADDER BIOPSY, BILATERAL RETROGRADE PYELOGRAM  RAD TECH FROM RADIOLOGY PER JOY  . CYSTOSCOPY WITH BIOPSY N/A 01/21/2013   Procedure: CYSTOSCOPY WITH BIOPSY;  Surgeon: Molli Hazard, MD;  Location[facility];  Service: Urology;  Laterality: N/A;  . ENDARTERECTOMY Right 07/14/2013   Procedure: ENDARTERECTOMY CAROTID;  Surgeon: Angelia Mould, MD;  Location: [facility];  Service: Vascular;  Laterality: Right;  . EP IMPLANTABLE DEVICE N/A 08/12/2015   Procedure: Loop Recorder Insertion;  Surgeon: Thompson Grayer, MD;  Location: [facility] CV LAB;  Service: Cardiovascular;  Laterality: N/A;  . FEMORAL-POPLITEAL BYPASS GRAFT Left 1994  [facility]   AND 2001  IN  [place]  . FEMORAL-POPLITEAL BYPASS GRAFT Left 08/30/2015   Procedure: REVISION OF BYPASS GRAFT Left  FEMORAL-POPLITEAL ARTERY;  Surgeon: Angelia Mould, MD;  Location: [facility];  Service: Vascular;  Laterality: Left;  . LARYNGOSCOPY  06-27-2004   BX VOCAL CORD  (LEUKOPLAKIA)  PER PT NO ISSUES SINCE  . LEFT HEART CATH AND CORONARY ANGIOGRAPHY N/A 08/20/2017   Procedure: LEFT HEART CATH AND CORONARY ANGIOGRAPHY;  Surgeon: Larey Dresser, MD;  Location: [facility] CV LAB;  Service: Cardiovascular;  Laterality: N/A;  . LOWER EXTREMITY ANGIOGRAM Bilateral 08/29/2015   Procedure: Lower Extremity Angiogram;  Surgeon: Angelia Mould, MD;  Location: [place] CV LAB;  Service: Cardiovascular;  Laterality: Bilateral;  . LUNG LOBECTOMY  01/24/2011    RIGHT UPPER LOBE  (SQUAMOUS CELL CARCINOMA) Dr Dorthea Cove ,  [facility]. No chemotherapyor radiation  . PATCH ANGIOPLASTY Right 07/14/2013   Procedure: PATCH ANGIOPLASTY;  Surgeon: Angelia Mould, MD;  Location: [facility];  Service: Vascular;  Laterality: Right;  . PATCH ANGIOPLASTY Left 08/30/2015   Procedure: VEIN PATCH ANGIOPLASTY OF PROXIMAL Left BYPASS GRAFT;  Surgeon: Angelia Mould, MD;  Location: [facility] [place];  Service: Vascular;  Laterality: Left;  . PERIPHERAL VASCULAR CATHETERIZATION N/A 08/29/2015   Procedure: Abdominal Aortogram;  Surgeon: Angelia Mould, MD;  Location: [facility] CV LAB;  Service: Cardiovascular;  Laterality: N/A;  . TEE WITHOUT CARDIOVERSION N/A 02/14/2018   Procedure: TRANSESOPHAGEAL ECHOCARDIOGRAM (TEE);  Surgeon: Larey Dresser, MD;  Location[facility] ENDOSCOPY;  Service: Cardiovascular;  Laterality: N/A;  . trabecular surgery     OS  . TRANSTHORACIC ECHOCARDIOGRAM  12-29-2012  DR [facility]   MILD LVH/  LVSF NORMAL/ EF 36-14%/  GRADE I DIASTOLIC DYSFUNCTION        Home Medications    Prior to Admission medications   Medication Sig Start Date End Date Taking? Authorizing Provider  acetaminophen (TYLENOL)  325 MG tablet Take 650 mg every 6 (six) hours as needed by mouth for moderate pain or headache.    [provider]  amiodarone (PACERONE) 200 MG tablet TAKE 1/2 TABLET EVERY DAY. 01/21/18   Larey Dresser, MD  bimatoprost (LUMIGAN) 0.01 % SOLN Place 1 drop at bedtime into both eyes.     [provider]  Biotin 10000 MCG TABS Take 10,000 mcg daily by mouth.     [provider]  budesonide-formoterol (SYMBICORT) 160-4.5 MCG/ACT inhaler Inhale 2 puffs into the lungs 2 (two) times daily. 01/20/18   Chesley Mires, MD  CARDURA 4 MG tablet TAKE (1/2) TABLET DAILY. 10/16/17   Binnie Rail, MD  dorzolamide-timolol (COSOPT) 22.3-6.8 MG/ML ophthalmic solution Place 2 drops 2 (two) times daily into the right eye.     [provider]  ELIQUIS 5 MG TABS tablet TAKE 1 TABLET BY MOUTH TWICE DAILY. 04/14/18   Larey Dresser, MD  FLUoxetine (PROZAC) 20 MG capsule TAKE (1) CAPSULE DAILY. 06/23/18   Binnie Rail, MD  fluticasone (FLONASE) 50 MCG/ACT nasal spray Place 2 sprays into both nostrils daily. 01/17/18   [provider]  furosemide (LASIX) 20 MG tablet Take 1 tablet (20 mg total) by mouth daily. 02/05/18 02/05/19  Sherran Needs, NP  Hypertonic Nasal Wash (SINUS RINSE KIT NA) Place 1 application into the nose daily as needed (congestion).    [provider]  iron polysaccharides (POLY-IRON 150) 150 MG capsule Take 150 mg by mouth daily.    [provider]  Melatonin 5 MG TABS Take 5 mg by mouth at bedtime.    [provider]  montelukast (SINGULAIR) 10 MG tablet Take 1 tablet (10 mg total) by mouth at bedtime. 02/10/18   Binnie Rail, MD  Multiple Vitamin (MULTIVITAMIN WITH MINERALS) TABS tablet Take 1 tablet by mouth daily. Centrum Silver    [provider]  Polyethyl Glycol-Propyl Glycol (SYSTANE OP) Place 1-2 drops as needed into both eyes (for dry eyes).    [provider]  potassium chloride SA (K-DUR,KLOR-CON) 20 MEQ  tablet TAKE 1 TABLET ONCE DAILY. 04/21/18   Larey Dresser, MD  ranitidine (ZANTAC) 150 MG tablet Take 150 mg by mouth daily as needed for heartburn.     [provider]  rosuvastatin (CRESTOR) 40 MG tablet TAKE 1 TABLET ONCE DAILY. 01/08/18   [name]  S, MD    Family History Family History  Problem Relation Age of Onset  . Stroke Mother        mini strokes  . Alcohol abuse Father   . Heart disease Father        MI after 34  . Stroke Father   . Hypertension Father   . Heart attack Father   . Heart disease Paternal Aunt        several  . Hypertension Paternal Aunt        several  . Stroke Paternal Aunt        several  . Stroke Paternal Uncle        several  . Heart disease Paternal Uncle        several;2 had MI pre 48    Social History Social History   Tobacco Use  . Smoking status: Former Smoker    Packs/day: 0.50    Years: 50.00    Pack years: 25.00    Types: Cigarettes    Last attempt to quit: 07/29/2011    Years since quitting: 6.9  . Smokeless tobacco: Never Used  Substance Use Topics  . Alcohol use: Yes    Alcohol/week: 0.0 standard drinks    Comment:  socially, variable  . Drug use: No     Allergies   Penicillins; Sulfonamide derivatives; Niacin-lovastatin er; and Atorvastatin   Review of Systems Review of Systems  Gastrointestinal:       Patient has noticed dark stools  All other systems reviewed and are negative.    Physical Exam Updated Vital Signs BP (!) 180/66 (BP Location: Right Arm)   Pulse (!) 47   Temp 98.1 F (36.7 C) (Oral)   Resp 20   Ht 1.854 m ('6\' 1"'$ )   Wt 90.7 kg   SpO2 97%   BMI 26.39 kg/m   Physical Exam  Constitutional: He is oriented to person, place, and time. He appears well-developed and well-nourished. No distress.  HENT:  Head: Normocephalic and atraumatic.  Right Ear: External ear normal.  Left Ear: External ear normal.  Mouth/Throat: Oropharynx is clear and moist.  Eyes: Conjunctivae are  normal. Right eye exhibits no discharge. Left eye exhibits no discharge. No scleral icterus.  Neck: Neck supple. No tracheal deviation present.  Cardiovascular: Normal rate, regular rhythm and intact distal pulses.  Pulmonary/Chest: Effort normal and breath sounds normal. No stridor. No respiratory distress. He has no wheezes. He has no rales.  Abdominal: Soft. Bowel sounds are normal. He exhibits no distension. There is no tenderness. There is no rebound and no guarding.  Genitourinary:  Genitourinary Comments: Black stool, guaiac positive  Musculoskeletal: He exhibits no edema or tenderness.  Neurological: He is alert and oriented to person, place, and time. He has normal strength. No cranial nerve deficit (no facial droop, extraocular movements intact, no slurred speech) or sensory deficit. He exhibits normal muscle tone. He displays no seizure activity. Coordination normal.  No pronator drift bilateral upper extrem, able to hold both legs off bed for 5 seconds, sensation intact in all extremities, visual acuity diminished but no visual field cuts , no left or right sided neglect, abnormal finger-nose exam , no nystagmus noted   Skin: Skin is warm and dry. No rash noted.  Psychiatric: He has a normal mood and affect.  Nursing note and vitals reviewed.    ED Treatments / Results  Labs (all labs ordered are listed, but only abnormal results are displayed) Labs Reviewed  PROTIME-INR - Abnormal; Notable for the following components:      Result Value   Prothrombin Time 15.3 (*)    All other components within normal limits  CBC - Abnormal; Notable for the following components:   RBC 3.88 (*)    Hemoglobin 10.7 (*)    HCT 35.2 (*)    All other components within normal limits  COMPREHENSIVE METABOLIC PANEL - Abnormal; Notable for the following components:   Glucose, Bld 124 (*)    Creatinine, Ser 1.36 (*)    Albumin 3.2 (*)    GFR calc non Af Amer 49 (*)    GFR calc Af Amer 56 (*)     All other components within normal limits  I-STAT CHEM 8, ED - Abnormal; Notable for the following components:   Creatinine, Ser 1.50 (*)    Glucose, Bld 119 (*)    Hemoglobin 10.5 (*)    HCT 31.0 (*)    All other components within normal limits  POC OCCULT BLOOD, ED - Abnormal; Notable for the following components:   Fecal Occult Bld POSITIVE (*)    All other components within normal limits  ETHANOL  APTT  DIFFERENTIAL  URINALYSIS, ROUTINE W REFLEX MICROSCOPIC  RAPID URINE DRUG SCREEN, HOSP PERFORMED  I-STAT TROPONIN, ED    EKG EKG Interpretation  Date/Time:  Monday June 23 2018 14:23:02 EDT Ventricular Rate:  49 PR Interval:  112 QRS Duration: 72 QT Interval:  458 QTC Calculation: 413 R Axis:   73 Text Interpretation:  atrial fibrillation Nonspecific ST abnormality Abnormal QRS-T angle, consider primary T wave abnormality Abnormal ECG a fib is new since last tracing Confirmed by Dorie Rank [phone]) on 06/23/2018 6:33:40 PM   Radiology Ct Head Wo Contrast  Result Date: 06/23/2018 CLINICAL DATA:  77 year old male with a history of dizziness EXAM: CT HEAD WITHOUT CONTRAST TECHNIQUE: Contiguous axial images were obtained from the base of the skull through the vertex without intravenous contrast. COMPARISON:  01/29/2018, 06/22/2015, MR 07/29/2015 FINDINGS: Brain: No acute intracranial hemorrhage. No midline shift or mass effect. Gray-white differentiation maintained. Unremarkable appearance of the ventricular system. Vascular: Calcifications of the intracranial vasculature. Skull: No acute fracture.  No aggressive bone lesion identified. Sinuses/Orbits: Bilateral globe/scleral surgery. Mastoid air cells clear. No middle ear effusion. No significant sinus disease. Other: None IMPRESSION: Negative for acute intracranial abnormality. Electronically Signed   By: Corrie Mckusick D.O.   On: 06/23/2018 15:43    Procedures .Critical Care Performed by: Dorie Rank, MD Authorized by:  Dorie Rank, MD   Critical care provider statement:    Critical care time (minutes):  45   Critical care was time spent personally by me on the following activities:  Discussions with consultants, evaluation of patient's response to treatment, examination of patient, ordering and performing treatments and interventions, ordering and review of laboratory studies, ordering and review of radiographic studies, pulse oximetry, re-evaluation of patient's condition, obtaining history from patient or surrogate and review of old charts   (including critical care time)  Medications Ordered in ED Medications - No data to display   Initial Impression / Assessment and Plan / ED Course  I have reviewed the triage vital signs and the nursing notes.  Pertinent labs & imaging results that were available during my care of the patient were reviewed by me and considered in my medical decision making (see chart for details).  Clinical Course as of Jun 23 1924  Mon Jun 23, 2018  1830 No change from  baseline  Comprehensive metabolic panel(!) [JK]  4830 Hgb decreased from prior  CBC(!) [JK]    Clinical Course User Index [JK] Dorie Rank, MD   Patient presented to the emergency room for evaluation of dizziness with concerns of possible TIA.  Patient does have some difficulty with finger-nose exam but this may be related to his vision difficulties.  Patient symptoms were more bilateral in nature however the family states he had similar symptoms when he was diagnosed with a TIA ended up having an endarterectomy.  Patient is labs are notable for a slow decrease in his hemoglobin.  He is guaiac positive.  Possible that that may be a factor as well.  He also does have bradycardia that could have contributed to his dizziness.  I will order an MRI to rule out occult stroke.  I will consult the medical service for admission to have further work-up of possible GI bleeding contributing to his lightheadedness.  Final  Clinical Impressions(s) / ED Diagnoses   Final diagnoses:  Dizziness  Anemia, unspecified type  Gastrointestinal hemorrhage, unspecified gastrointestinal hemorrhage type      Dorie Rank, MD 06/23/18 1929

## 2018-06-23 NOTE — ED Triage Notes (Signed)
Pt states yesterday he had an episode of dizziness, poor balance, fell into the bench. Pt had no visual disturbances, speech problems, or confusion. Pt states today he feels better, but does not feel back to normal. He is not dizzy, but he is tired. Denies any pain anywhere. No neuro deficits today.

## 2018-06-23 NOTE — ED Provider Notes (Signed)
MSE was initiated and I personally evaluated the patient and placed orders (if any) at  2:38 PM on June 23, 2018.  The patient appears stable so that the remainder of the MSE may be completed by another provider.  Patient placed in Quick Look pathway, seen and evaluated   Chief Complaint: dizziness  HPI:   77yo M with pmh of AAA, A-fib, prior TIA presents for possible TIA sent by PCP. Patient states yesterday when walking in the driveway, began feeling "wobbly" and off balance. This caused him to grab on to the shrubs for support. He fell on to the ground as a result of it. Denies any head injuries or LOC. Since then has also been feeling off balance although not as severe as that time. Denies any blurry vision, headache, chest pain. Reports similar but more severe symptoms when he had his TIA several years ago (which prompted endarterectomy).  ROS: dizziness (one)  Physical Exam:   Gen: No distress  Neuro: Awake and Alert  Skin: Warm    Focused Exam: PERRL. Very minimal L facial droop noted when raising eyebrows. Smile symmetrical. Fluent speech. Strength 5/5 in BUE, BLE.   Initiation of care has begun. The patient has been counseled on the process, plan, and necessity for staying for the completion/evaluation, and the remainder of the medical screening examination    Delia Heady, PA-C 06/23/18 1442    Valarie Merino, MD 06/23/18 1728

## 2018-06-24 ENCOUNTER — Encounter (HOSPITAL_COMMUNITY): Payer: Commercial Managed Care - PPO

## 2018-06-24 ENCOUNTER — Encounter (HOSPITAL_COMMUNITY): Payer: Self-pay | Admitting: Physician Assistant

## 2018-06-24 DIAGNOSIS — C187 Malignant neoplasm of sigmoid colon: Secondary | ICD-10-CM | POA: Diagnosis not present

## 2018-06-24 DIAGNOSIS — Z86010 Personal history of colonic polyps: Secondary | ICD-10-CM

## 2018-06-24 DIAGNOSIS — D508 Other iron deficiency anemias: Secondary | ICD-10-CM | POA: Diagnosis not present

## 2018-06-24 DIAGNOSIS — K922 Gastrointestinal hemorrhage, unspecified: Secondary | ICD-10-CM | POA: Diagnosis not present

## 2018-06-24 DIAGNOSIS — K210 Gastro-esophageal reflux disease with esophagitis: Secondary | ICD-10-CM | POA: Diagnosis not present

## 2018-06-24 DIAGNOSIS — D509 Iron deficiency anemia, unspecified: Secondary | ICD-10-CM | POA: Diagnosis not present

## 2018-06-24 LAB — CBC
HCT: 33.6 % — ABNORMAL LOW (ref 39.0–52.0)
HEMATOCRIT: 30.9 % — AB (ref 39.0–52.0)
Hemoglobin: 10.4 g/dL — ABNORMAL LOW (ref 13.0–17.0)
Hemoglobin: 9.6 g/dL — ABNORMAL LOW (ref 13.0–17.0)
MCH: 27.4 pg (ref 26.0–34.0)
MCH: 27.5 pg (ref 26.0–34.0)
MCHC: 31 g/dL (ref 30.0–36.0)
MCHC: 31.1 g/dL (ref 30.0–36.0)
MCV: 88.4 fL (ref 78.0–100.0)
MCV: 88.5 fL (ref 78.0–100.0)
PLATELETS: 266 10*3/uL (ref 150–400)
PLATELETS: 235 10*3/uL (ref 150–400)
RBC: 3.8 MIL/uL — AB (ref 4.22–5.81)
RBC: 3.49 MIL/uL — ABNORMAL LOW (ref 4.22–5.81)
RDW: 14 % (ref 11.5–15.5)
RDW: 13.8 % (ref 11.5–15.5)
WBC: 6 10*3/uL (ref 4.0–10.5)
WBC: 5 10*3/uL (ref 4.0–10.5)

## 2018-06-24 LAB — RETICULOCYTES
RBC.: 3.49 MIL/uL — ABNORMAL LOW (ref 4.22–5.81)
Retic Count, Absolute: 52.4 10*3/uL (ref 19.0–186.0)
Retic Ct Pct: 1.5 % (ref 0.4–3.1)

## 2018-06-24 LAB — BASIC METABOLIC PANEL
Anion gap: 10 (ref 5–15)
BUN: 14 mg/dL (ref 8–23)
CALCIUM: 8.9 mg/dL (ref 8.9–10.3)
CO2: 24 mmol/L (ref 22–32)
CREATININE: 1.28 mg/dL — AB (ref 0.61–1.24)
Chloride: 107 mmol/L (ref 98–111)
GFR calc Af Amer: >60 mL/min (ref >60)
GFR calc non Af Amer: 52 mL/min — ABNORMAL LOW (ref >60)
Glucose, Bld: 121 mg/dL — ABNORMAL HIGH (ref 70–99)
Potassium: 4 mmol/L (ref 3.5–5.1)
Sodium: 141 mmol/L (ref 135–145)

## 2018-06-24 LAB — HEMOGLOBIN AND HEMATOCRIT, BLOOD
HEMATOCRIT: 32.9 % — AB (ref 39.0–52.0)
Hemoglobin: 10.4 g/dL — ABNORMAL LOW (ref 13.0–17.0)

## 2018-06-24 LAB — TROPONIN I: Troponin I: <0.03 ng/mL (ref <0.03)

## 2018-06-24 LAB — IRON AND TIBC
Iron: 26 ug/dL — ABNORMAL LOW (ref 45–182)
SATURATION RATIOS: 7 % — AB (ref 17.9–39.5)
TIBC: 378 ug/dL (ref 250–450)
UIBC: 352 ug/dL

## 2018-06-24 LAB — TSH: TSH: 0.751 u[IU]/mL (ref 0.350–4.500)

## 2018-06-24 LAB — FERRITIN: Ferritin: 9 ng/mL — ABNORMAL LOW (ref 24–336)

## 2018-06-24 LAB — VITAMIN B12: Vitamin B-12: 454 pg/mL (ref 180–914)

## 2018-06-24 MED ORDER — PEG-KCL-NACL-NASULF-NA ASC-C 100 G PO SOLR
0.5000 | Freq: Once | ORAL | Status: AC
  Administered 2018-06-24: 100 g via ORAL
  Filled 2018-06-24: qty 1

## 2018-06-24 MED ORDER — PEG-KCL-NACL-NASULF-NA ASC-C 100 G PO SOLR
0.5000 | Freq: Once | ORAL | Status: AC
  Administered 2018-06-25: 100 g via ORAL
  Filled 2018-06-24: qty 1

## 2018-06-24 MED ORDER — SODIUM CHLORIDE 0.9 % IV SOLN
INTRAVENOUS | Status: AC
  Administered 2018-06-24: 08:00:00 via INTRAVENOUS

## 2018-06-24 MED ORDER — BISACODYL 5 MG PO TBEC
20.0000 mg | DELAYED_RELEASE_TABLET | Freq: Once | ORAL | Status: AC
  Administered 2018-06-24: 20 mg via ORAL
  Filled 2018-06-24: qty 4

## 2018-06-24 MED ORDER — METOCLOPRAMIDE HCL 5 MG/ML IJ SOLN
10.0000 mg | Freq: Once | INTRAMUSCULAR | Status: AC
  Administered 2018-06-24: 10 mg via INTRAVENOUS
  Filled 2018-06-24: qty 2

## 2018-06-24 MED ORDER — FAMOTIDINE IN NACL 20-0.9 MG/50ML-% IV SOLN
20.0000 mg | Freq: Two times a day (BID) | INTRAVENOUS | Status: DC
  Administered 2018-06-24: 20 mg via INTRAVENOUS
  Filled 2018-06-24: qty 50

## 2018-06-24 MED ORDER — PEG-KCL-NACL-NASULF-NA ASC-C 100 G PO SOLR: 1.0000 | Freq: Once | ORAL | Status: DC

## 2018-06-24 MED ORDER — PANTOPRAZOLE SODIUM 40 MG PO TBEC
40.0000 mg | DELAYED_RELEASE_TABLET | Freq: Two times a day (BID) | ORAL | Status: DC
  Administered 2018-06-24 – 2018-06-26 (×5): 40 mg via ORAL
  Filled 2018-06-24 (×5): qty 1

## 2018-06-24 MED ORDER — METOCLOPRAMIDE HCL 5 MG/ML IJ SOLN
10.0000 mg | Freq: Once | INTRAMUSCULAR | Status: AC
  Administered 2018-06-25: 10 mg via INTRAVENOUS
  Filled 2018-06-24: qty 2

## 2018-06-24 NOTE — Progress Notes (Addendum)
[facility] PROGRESS NOTE  Troy Speegle [name] DZH:[id number] DOB: 04-27-1941 DOA: 06/23/2018 PCP: Binnie Rail, MD  Assessment/Plan: 1. GI bleed - Heme + stool. Hg 11.7 last month and 9.6 this am. No NSAID use. Has been Type and screen and will plan transfuse if hemoglobin less than 7.  Continue to hold apixaban. Continue Pepcid IV. Evaluated by GI who recommend EGD and colonoscopy tomorrow. Monitor CBC 2. Generalized weakness -likely related to #1. HR range 48-56. Some concern that this is contributing as well. No events on tele. Troponin negative TSH 0.75 3. History of paroxysmal atrial fibrillation on amiodarone.  See #2 with regarding to amiodarone.  Apixaban on hold due to possible GI bleed. 4. Sleep apnea on CPAP. 5. Chronic kidney disease stage III -creatinine  at baseline. 6. Hyperlipidemia on statins. 7. History of abdominal aortic aneurysm denies any abdominal pain. 8. History of COPD -not actively wheezing continue inhalers.  Code Status: full Family Communication: wife Disposition Plan: home hopefully tomorrow    Consultants:  [facility]  Procedures:  none  Antibiotics:  none  HPI/Subjective: Troy Mcginness [name] is a 77 y.o. male with history of paroxysmal atrial fibrillation, hypertension, TIA.  History of carotid endarterectomy, abdominal aortic aneurysm who was brought to the ER on 9/23 after patient was complaining of increasing weakness.  Patient stated yesterday at home while walking he felt dizzy and fell onto the bushes.  He felt weak generalized but did not lose function of the hands or leg.  He was able to regain strength and walked up to his house.  Did not have any chest pain shortness of breath nausea vomiting abdominal pain.  Had been noticing dark stools last few days.  Denied taking any NSAIDs.  Since patient was not still feeling well patient presented to the ER.  Objective: Vitals:   06/23/18 2358 06/24/18 0517  BP: (!) 136/54 (!) 149/70   Pulse: (!) 48 (!) 54  Resp: 16 18  Temp: 98.2 F (36.8 C) 98.4 F (36.9 C)  SpO2: 97% 96%   No intake or output data in the 24 hours ending 06/24/18 1051 Filed Weights   06/23/18 1818 06/23/18 2152  Weight: 90.7 kg 90.9 kg    Exam:   General: awake alert in no acute distress  Cardiovascular: irregularly irregular no mgr no LE edema  Respiratory: normal effort BS clear bilaterally no wheeze  Abdomen: soft +BS non-tender no guarding or rebounding  Musculoskeletal: joints without swelling/erythema   Data Reviewed: Basic Metabolic Panel: Recent Labs  Lab 06/23/18 1449 06/23/18 1502 06/24/18 0107  NA 141 141 141  K 4.3 4.2 4.0  CL 108 107 107  CO2 26  --  24  GLUCOSE 124* 119* 121*  BUN _0 CREATININE 1.36* 1.50* 1.28*  CALCIUM 9.3  --  8.9   Liver Function Tests: Recent Labs  Lab 06/23/18 1449  AST 19  ALT 12  ALKPHOS 61  BILITOT 0.4  PROT 6.7  ALBUMIN 3.2*   No results for input(s): LIPASE, AMYLASE in the last 168 hours. No results for input(s): AMMONIA in the last 168 hours. CBC: Recent Labs  Lab 06/23/18 1449 06/23/18 1502 06/24/18 0107 06/24/18 0607  WBC 5.4  --  6.0 5.0  NEUTROABS 3.8  --   --   --   HGB 10.7* 10.5* 10.4* 9.6*  HCT 35.2* 31.0* 33.6* 30.9*  MCV 90.7  --  88.4 88.5  PLT 265  --  266 235  Cardiac Enzymes: Recent Labs  Lab 06/24/18 0750  TROPONINI <0.03   BNP (last 3 results) Recent Labs    01/29/18 1631  BNP 341.7*    ProBNP (last 3 results) No results for input(s): PROBNP in the last 8760 hours.  CBG: No results for input(s): GLUCAP in the last 168 hours.  No results found for this or any previous visit (from the past 240 hour(s)).   Studies: Ct Head Wo Contrast  Result Date: 06/23/2018 CLINICAL DATA:  77 year old male with a history of dizziness EXAM: CT HEAD WITHOUT CONTRAST TECHNIQUE: Contiguous axial images were obtained from the base of the skull through the vertex without intravenous contrast.  COMPARISON:  01/29/2018, 06/22/2015, MR 07/29/2015 FINDINGS: Brain: No acute intracranial hemorrhage. No midline shift or mass effect. Gray-white differentiation maintained. Unremarkable appearance of the ventricular system. Vascular: Calcifications of the intracranial vasculature. Skull: No acute fracture.  No aggressive bone lesion identified. Sinuses/Orbits: Bilateral globe/scleral surgery. Mastoid air cells clear. No middle ear effusion. No significant sinus disease. Other: None IMPRESSION: Negative for acute intracranial abnormality. Electronically Signed   By: Corrie Mckusick D.O.   On: 06/23/2018 15:43   Mr Brain Wo Contrast  Result Date: 06/23/2018 CLINICAL DATA:  Focal neuro deficit, > 6 hrs, stroke suspected EXAM: MRI HEAD WITHOUT CONTRAST TECHNIQUE: Multiplanar, multiecho pulse sequences of the brain and surrounding structures were obtained without intravenous contrast. COMPARISON:  06/23/2018 head CT Brain MRI 07/29/2015 FINDINGS: BRAIN: There is no acute infarct, acute hemorrhage or mass effect. The midline structures are normal. There are no old infarcts. Multifocal white matter hyperintensity, most commonly due to chronic ischemic microangiopathy. The CSF spaces are normal for age, with no hydrocephalus. Susceptibility-sensitive sequences show no chronic microhemorrhage or superficial siderosis. VASCULAR: Major intracranial arterial and venous sinus flow voids are preserved. SKULL AND UPPER CERVICAL SPINE: The visualized skull base, calvarium, upper cervical spine and extracranial soft tissues are normal. SINUSES/ORBITS: No fluid levels or advanced mucosal thickening. No mastoid or middle ear effusion. The orbits are normal. IMPRESSION: Chronic ischemic microangiopathy without acute intracranial abnormality. Electronically Signed   By: Ulyses Jarred M.D.   On: 06/23/2018 21:01    Scheduled Meds: . amiodarone  100 mg Oral Daily  . bisacodyl  20 mg Oral Once  . dorzolamide-timolol  2 drop Both  Eyes BID  . FLUoxetine  20 mg Oral Daily  . latanoprost  1 drop Both Eyes QHS  . magnesium oxide  400 mg Oral QHS  . Melatonin  6 mg Oral QHS  . metoCLOPramide (REGLAN) injection  10 mg Intravenous Once   Followed by  . [START ON 06/25/2018] metoCLOPramide (REGLAN) injection  10 mg Intravenous Once  . montelukast  10 mg Oral QHS  . pantoprazole  40 mg Oral BID  . peg 3350 powder  1 kit Oral Once  . rosuvastatin  40 mg Oral QHS   Continuous Infusions: . sodium chloride 50 mL/hr at 06/24/18 8563    Principal Problem:   Acute GI bleeding Active Problems:   Acute blood loss anemia   AAA (abdominal aortic aneurysm) (HCC)   Chronic renal disease, stage 3, moderately decreased glomerular filtration rate between 30-59 mL/min/1.73 square meter (HCC)   Benign essential HTN   CAD (coronary artery disease)   Chronic diastolic CHF (congestive heart failure) (HCC)   PAF (paroxysmal atrial fibrillation) (HCC)   OSA (obstructive sleep apnea)    Time spent: 40 minutes    [facility] M  [facility]  If 7PM-7AM,  please contact night-coverage at [email or web address], password [email or web address] 06/24/2018, 10:51 AM  LOS: 0 days

## 2018-06-24 NOTE — Progress Notes (Signed)
Patient refused CPAP at this time.

## 2018-06-24 NOTE — Consult Note (Addendum)
[facility] Consult: 8:12 AM 06/24/2018  LOS: 0 days    Referring Provider: Dr Evangeline Gula  Primary Care Physician:  Binnie Rail, MD Primary Gastroenterologist:  Dr Olevia Perches,      Reason for Consultation: Anemia, FOBT positive.   HPI: Troy Lappe [name] is a 77 y.o. male.  PMH CAD. Afib, cardioversion 01/2018.   ASPVD, s/p fem-pop BPG , CEA and LE angioplasty/stents.  Hx TIA.  S/p paacemaker.  AAA.  GERD.  Htn.  Emphysema, COPD. Squamous cell lung cancer, S/p RLL lobectomy 2012.  OSA.  Gallstones on Ct chest 08/2017.   Hx falls. 05/2007 Colonoscopy.  Polyp surveillance, adenomas in 2005.  Three, 2 - 3 mm rectal polyps (hyperplastic).  Sigmoid tics.      Did not respond to call back letter from GI in 2015.    For a week or more he is been feeling malaise, lethargy, lack of energy.  On Sunday he was walking in the driveway and got wobbly and fell into the bushes.  He did not lose consciousness.  His equilibrium returned and he was able to get to the house and felt okay the rest of the day.  Monday morning his wife contacted Dr. Doren Custard, his vascular surgeon.  Patient was advised to proceed to the ED.    Hgb 10.7 >> 9.6.  Baseline in May ~ 13, 11.7 05/26/18.  MCV 88.   Iron 26.  Ferritin 9.   FOBT +.   BUN not elevated.   MRI and CT head: Nothing acute.  Chronic ischemic microangiopathy.    Baseline GI ROS.  No abdominal pain.  No nausea.  Intermittent constipation.  Uses mag oxide nightly.  Chronically dark stools ranging from formed to soft.  On chronic p.o. iron for at least 2 or 3 years.  Appetite stable.  Weight fluctuates by 3 or 4 pounds and has done this for many years.  Does not use aspirin or NSAIDs.  Drinks a couple of glasses of wine every few weeks.  Robust red wines because heartburn so he avoids these.  Uses  ranitidine at most a couple of times a month as needed for heartburn.   Is not noticed any unusual bleeding or bruising tendencies. Last took Eliquis in a.m. 06/23/2018     Past Medical History:  Diagnosis Date  . AAA (abdominal aortic aneurysm) (HCC) LAST ABD. [name]  JAN 2014  3.4CM   MONITORED BY DR [name]  . Arthritis   . Atrial fibrillation ([place])   . Benign essential HTN 01/19/2016  . Bilateral carotid artery stenosis DUPLEX 12-29-2012  BY DR [facility]   BILATERAL ICA STENOSIS 60-79%  . BPH (benign prostatic hypertrophy)   . Cancer ([place]) 01-25-12   LUNG  . Complication of anesthesia    had nausea after carotid artery surgery, states the medicine given to help the nausea, made it worse  . COPD (chronic obstructive pulmonary disease) ([place])   . Exertional dyspnea   . Frequency of urination   . GERD (gastroesophageal reflux disease)   . Glaucoma BOTH  EYES   Dr Gershon Crane  . Glaucoma   . History of basal cell carcinoma excision   . History of lung cancer APRIL 2012  SQUAMOUS CELL---- S/P RIGHT LOWER LOBECTOMY AT [facility] --  NO CHEMORADIATION---  NO RECURRENCE    ONCOLOGIST- DR Tressie Stalker  LOV [facility] 10-27-2012  . Hx of adenomatous colonic polyps 2005    X 2; 1 hyperplastic polyp; Dr Olevia Perches  . Hyperlipidemia   . Hypertension   . Impaired fasting glucose 2007   108; A1c5.4%  . Lesion of bladder   . Microhematuria   . Nocturia   . OSA (obstructive sleep apnea) 08/29/2015   Mild with AHI overall 6/hr but severe during REM sleep with AHI 30/hr with oxygen saturations < 88% for 5.5 minutes of total sleep time.    [name] PAD (peripheral artery disease) ([place]) ABI'S  JAN 2014  0.65 ON RIGHT ;  1.04 ON LEFT  . Peripheral vascular disease ([place]) S/P ANGIOPLASTY AND STENTING   FOLLOWED  BY DR [name]  . Spinal stenosis Sept. 2015  . Stroke [facility]) Jul 07, 2013   mini  TIA  . Urgency of urination     Past Surgical History:  Procedure Laterality Date  . ANGIO PLASTY     X 4 in legs  . AORTOGRAM   07-27-2002   MILD DIFFUSE ILIAC ARTERY OCCLUSIVE DISEASE /  LEFT RENAL ARTERY 20%/ PATENT LEFT FEM-POP GRAFT/ MILD SFA AND POPLITEAL ARTERY OCCLUSIVE DISEASE W/ SEVERE KIDNEY OCCLUSIVE DISEASE  . BASAL CELL CARCINOMA EXCISION     MULTIPLE TIMES--  RIGHT FOREARM, CHEEKS, AND BACK  . CARDIOVASCULAR STRESS TEST  12-08-2012  DR MCLEAN   NORMAL LEXISCAN WITH NO EXERCISE NUCLEAR STUDY/ EF 66%/   NO ISCHEMIA/ NO SIGNIFICANT CHANGE FROM PRIOR STUDY  . CARDIOVERSION N/A 02/14/2018   Procedure: CARDIOVERSION;  Surgeon: Larey Dresser, MD;  Location[facility] ENDOSCOPY;  Service: Cardiovascular;  Laterality: N/A;  . CAROTID ANGIOGRAM N/A 07/10/2013   Procedure: CAROTID ANGIOGRAM;  Surgeon: Elam Dutch, MD;  Location: [facility] CATH LAB;  Service: Cardiovascular;  Laterality: N/A;  . CAROTID ENDARTERECTOMY Right 07-14-13   cea  . CATARACT EXTRACTION W/ INTRAOCULAR LENS  IMPLANT, BILATERAL    . colon polyectomy    . CYSTOSCOPY W/ RETROGRADES Bilateral 01/21/2013   Procedure: CYSTOSCOPY WITH RETROGRADE PYELOGRAM;  Surgeon: Molli Hazard, MD;  Location: [facility];  Service: Urology;  Laterality: Bilateral;   CYSTO, BLADDER BIOPSY, BILATERAL RETROGRADE PYELOGRAM  RAD TECH FROM RADIOLOGY PER JOY  . CYSTOSCOPY WITH BIOPSY N/A 01/21/2013   Procedure: CYSTOSCOPY WITH BIOPSY;  Surgeon: Molli Hazard, MD;  Location: [facility];  Service: Urology;  Laterality: N/A;  . ENDARTERECTOMY Right 07/14/2013   Procedure: ENDARTERECTOMY CAROTID;  Surgeon: Angelia Mould, MD;  Location: [place];  Service: Vascular;  Laterality: Right;  . EP IMPLANTABLE DEVICE N/A 08/12/2015   Procedure: Loop Recorder Insertion;  Surgeon: Thompson Grayer, MD;  Location: [place] CV LAB;  Service: Cardiovascular;  Laterality: N/A;  . FEMORAL-POPLITEAL BYPASS GRAFT Left [facility]   AND 2001  IN [place]  . FEMORAL-POPLITEAL BYPASS GRAFT Left 08/30/2015   Procedure: REVISION OF BYPASS GRAFT  Left  FEMORAL-POPLITEAL ARTERY;  Surgeon: Angelia Mould, MD;  Location: [place];  Service: Vascular;  Laterality: Left;  . LARYNGOSCOPY  06-27-2004   BX VOCAL CORD  (LEUKOPLAKIA)  PER PT NO ISSUES SINCE  . LEFT HEART CATH AND CORONARY ANGIOGRAPHY N/A  08/20/2017   Procedure: LEFT HEART CATH AND CORONARY ANGIOGRAPHY;  Surgeon: Larey Dresser, MD;  Location: [place] CV LAB;  Service: Cardiovascular;  Laterality: N/A;  . LOWER EXTREMITY ANGIOGRAM Bilateral 08/29/2015   Procedure: Lower Extremity Angiogram;  Surgeon: Angelia Mould, MD;  Location: [place] CV LAB;  Service: Cardiovascular;  Laterality: Bilateral;  . LUNG LOBECTOMY  01/24/2011    RIGHT UPPER LOBE  (SQUAMOUS CELL CARCINOMA) Dr Dorthea Cove , [facility]. No chemotherapyor radiation  . PATCH ANGIOPLASTY Right 07/14/2013   Procedure: PATCH ANGIOPLASTY;  Surgeon: Angelia Mould, MD;  Location: [place];  Service: Vascular;  Laterality: Right;  . PATCH ANGIOPLASTY Left 08/30/2015   Procedure: VEIN PATCH ANGIOPLASTY OF PROXIMAL Left BYPASS GRAFT;  Surgeon: Angelia Mould, MD;  Location: [place];  Service: Vascular;  Laterality: Left;  . PERIPHERAL VASCULAR CATHETERIZATION N/A 08/29/2015   Procedure: Abdominal Aortogram;  Surgeon: Angelia Mould, MD;  Location: [name] CV LAB;  Service: Cardiovascular;  Laterality: N/A;  . TEE WITHOUT CARDIOVERSION N/A 02/14/2018   Procedure: TRANSESOPHAGEAL ECHOCARDIOGRAM (TEE);  Surgeon: Larey Dresser, MD;  Location: [facility] ENDOSCOPY;  Service: Cardiovascular;  Laterality: N/A;  . trabecular surgery     OS  . TRANSTHORACIC ECHOCARDIOGRAM  12-29-2012  DR [facility]   MILD LVH/  LVSF NORMAL/ EF 05-39%/  GRADE I DIASTOLIC DYSFUNCTION    Prior to Admission medications   Medication Sig Start Date End Date Taking? Authorizing Provider  acetaminophen (TYLENOL) 325 MG tablet Take 650 mg every 6 (six) hours as needed by mouth for moderate pain or headache.   Yes [provider]    amiodarone (PACERONE) 200 MG tablet TAKE 1/2 TABLET EVERY DAY. Patient taking differently: Take 100 mg by mouth daily.  01/21/18  Yes Larey Dresser, MD  bimatoprost (LUMIGAN) 0.01 % SOLN Place 1 drop at bedtime into both eyes.    Yes [provider]  Biotin 10000 MCG TABS Take 10,000 mcg daily by mouth.    Yes [provider]  dorzolamide-timolol (COSOPT) 22.3-6.8 MG/ML ophthalmic solution Place 2 drops into both eyes 2 (two) times daily.    Yes [provider]  ELIQUIS 5 MG TABS tablet TAKE 1 TABLET BY MOUTH TWICE DAILY. 04/14/18  Yes Larey Dresser, MD  FLUoxetine (PROZAC) 20 MG capsule TAKE (1) CAPSULE DAILY. Patient taking differently: Take 20 mg by mouth daily.  06/23/18  Yes Burns, Claudina Lick, MD  furosemide (LASIX) 20 MG tablet Take 1 tablet (20 mg total) by mouth daily. 02/05/18 02/05/19 Yes Sherran Needs, NP  iron polysaccharides (POLY-IRON 150) 150 MG capsule Take 150 mg by mouth at bedtime.    Yes [provider]  Magnesium 500 MG CAPS Take 500 mg by mouth at bedtime.   Yes [provider]  Melatonin 5 MG TABS Take 5 mg by mouth at bedtime.   Yes [provider]  montelukast (SINGULAIR) 10 MG tablet Take 1 tablet (10 mg total) by mouth at bedtime. 02/10/18  Yes Burns, Claudina Lick, MD  Multiple Vitamins-Minerals (CENTRUM SILVER 50+MEN) TABS Take 1 tablet by mouth at bedtime.   Yes [provider]  Polyethyl Glycol-Propyl Glycol (SYSTANE OP) Place 1-2 drops as needed into both eyes (for dry eyes).   Yes [provider]  potassium chloride SA (K-DUR,KLOR-CON) 20 MEQ tablet TAKE 1 TABLET ONCE DAILY. Patient taking differently: Take 20 mEq by mouth daily.  04/21/18  Yes Larey Dresser, MD  ranitidine (ZANTAC) 150 MG  tablet Take 150 mg by mouth daily as needed for heartburn.    Yes [provider]  rosuvastatin (CRESTOR) 40 MG tablet TAKE 1 TABLET ONCE DAILY. Patient taking differently: Take 40 mg by mouth at  bedtime.  01/08/18  Yes Larey Dresser, MD  budesonide-formoterol [facility]) 160-4.5 MCG/ACT inhaler Inhale 2 puffs into the lungs 2 (two) times daily. Patient not taking: Reported on 06/23/2018 01/20/18   Chesley Mires, MD  CARDURA 4 MG tablet TAKE (1/2) TABLET DAILY. Patient not taking: No sig reported 10/16/17   Binnie Rail, MD    Scheduled Meds: . amiodarone  100 mg Oral Daily  . dorzolamide-timolol  2 drop Both Eyes BID  . FLUoxetine  20 mg Oral Daily  . latanoprost  1 drop Both Eyes QHS  . magnesium oxide  400 mg Oral QHS  . Melatonin  6 mg Oral QHS  . montelukast  10 mg Oral QHS  . rosuvastatin  40 mg Oral QHS   Infusions: . sodium chloride 50 mL/hr at 06/24/18 0739  . famotidine (PEPCID) IV 20 mg (06/24/18 0741)   PRN Meds: acetaminophen **OR** acetaminophen, ondansetron **OR** ondansetron (ZOFRAN) IV   Allergies as of 06/23/2018 - Review Complete 06/23/2018  Allergen Reaction Noted  . Niacin-lovastatin er Shortness Of Breath and Other (See Comments)   . Penicillins Hives, Shortness Of Breath, and Other (See Comments)   . Sulfonamide derivatives Hives, Shortness Of Breath, and Other (See Comments)   . Atorvastatin Other (See Comments)     Family History  Problem Relation Age of Onset  . Stroke Mother        mini strokes  . Alcohol abuse Father   . Heart disease Father        MI after 82  . Stroke Father   . Hypertension Father   . Heart attack Father   . Heart disease Paternal Aunt        several  . Hypertension Paternal Aunt        several  . Stroke Paternal Aunt        several  . Stroke Paternal Uncle        several  . Heart disease Paternal Uncle        several;2 had MI pre 55    Social History   Socioeconomic History  . Marital status: Married    Spouse name: [name]   . Number of children: 3  . Years of education: 12+  . Highest education level: Not on file  Occupational History  . Not on file  Social Needs  . Financial resource strain: Not  on file  . Food insecurity:    Worry: Not on file    Inability: Not on file  . Transportation needs:    Medical: Not on file    Non-medical: Not on file  Tobacco Use  . Smoking status: Former Smoker    Packs/day: 0.50    Years: 50.00    Pack years: 25.00    Types: Cigarettes    Last attempt to quit: 07/29/2011    Years since quitting: 6.9  . Smokeless tobacco: Never Used  Substance and Sexual Activity  . Alcohol use: Yes    Alcohol/week: 0.0 standard drinks    Comment:  socially, variable  . Drug use: No  . Sexual activity: Not on file  Lifestyle  . Physical activity:    Days per week: Not on file    Minutes per session: Not on file  .  Stress: Not on file  Relationships  . Social connections:    Talks on phone: Not on file    Gets together: Not on file    Attends religious service: Not on file    Active member of club or organization: Not on file    Attends meetings of clubs or organizations: Not on file    Relationship status: Not on file  . Intimate partner violence:    Fear of current or ex partner: Not on file    Emotionally abused: Not on file    Physically abused: Not on file    Forced sexual activity: Not on file  Other Topics Concern  . Not on file  Social History Narrative   Patient lives at home with spouse [name]   Patient has 3 children    Patient is right handed     REVIEW OF SYSTEMS: Constitutional:  Per HPI ENT:  No nose bleeds Pulm: No new DOE, cough. CV: Intermittent A. fib which he is not always aware of.  Chest pain.  Chronic minor left LE swelling.  This is the leg that has had vascular interventions performed GU:  No hematuria, no frequency GI:  Per HPI Heme: No unusual bleeding or bruising. Transfusions: Previous transfusions 2014, before 1987 and prior to 2001 Neuro: Patient had been scheduled at [facility] ophthalmology for drainage shunts procedure to left eye to address glaucoma.  He postponed this so it would not interrupt a plan family  gathering and hunting season.  It is now scheduled for the first of the coming year 2020.  Because of his visual limitations, the patient only drives locally and during the daytime.  No headaches, no peripheral tingling or numbness Derm:  No itching, no rash or sores.  Endocrine:  No sweats or chills.  No polyuria or dysuria Immunization: Reviewed.  Does not look like he has had his flu shot for the current flu season yet. Travel:  None beyond local counties in last few months.    PHYSICAL EXAM: Vital signs in last 24 hours: Vitals:   06/23/18 2358 06/24/18 0517  BP: (!) 136/54 (!) 149/70  Pulse: (!) 48 (!) 54  Resp: 16 18  Temp: 98.2 F (36.8 C) 98.4 F (36.9 C)  SpO2: 97% 96%   Wt Readings from Last 3 Encounters:  06/23/18 90.9 kg  06/17/18 91.7 kg  05/29/18 92.4 kg    General: Pleasant, alert Head: No facial asymmetry or swelling.  No signs of head trauma. Eyes: No scleral icterus.  No conjunctival pallor.  EOMI. Ears: Not hard of hearing Nose: No congestion or discharge Mouth: Fair dentition, several missing teeth.  Tongue midline.  Mucosa pink, moist, clear. Neck: No JVD, no masses, no thyromegaly. Lungs: Clear bilaterally.  Absent breath sounds in the right lower lobe worries had lobectomy.  No labored breathing or cough Heart: RRR.  No MRG.  S1, S2 present. Abdomen: Soft.  No masses, HSM, bruits, hernias, organomegaly.  Not tender or distended.  Active bowel sounds..   Rectal: Deferred rectal exam.  FOBT +, black stool on DRE in ED yesterday. Musc/Skeltl: No joint redness, swelling or market deformities. Extremities: Slight, nonpitting swelling in the left foot/ankle. Neurologic: Slow response time but accurate, appropriate answers.  Moves all 4 limbs, strength not tested.  No tremors or gross weakness/deficits. Skin: Palmar erythema, no telangiectasia, sores or significant purpura/hematoma. Tattoos: None observed Nodes: No cervical adenopathy. Psych: Pleasant,  cooperative.  Intake/Output from previous day: No intake/output data  recorded. Intake/Output this shift: No intake/output data recorded.  LAB RESULTS: Recent Labs    06/23/18 1449 06/23/18 1502 06/24/18 0107 06/24/18 0607  WBC 5.4  --  6.0 5.0  HGB 10.7* 10.5* 10.4* 9.6*  HCT 35.2* 31.0* 33.6* 30.9*  PLT 265  --  266 235   BMET Lab Results  Component Value Date   NA 141 06/24/2018   NA 141 06/23/2018   NA 141 06/23/2018   K 4.0 06/24/2018   K 4.2 06/23/2018   K 4.3 06/23/2018   CL 107 06/24/2018   CL 107 06/23/2018   CL 108 06/23/2018   CO2 24 06/24/2018   CO2 26 06/23/2018   CO2 26 05/26/2018   GLUCOSE 121 (H) 06/24/2018   GLUCOSE 119 (H) 06/23/2018   GLUCOSE 124 (H) 06/23/2018   BUN 14 06/24/2018   BUN 16 06/23/2018   BUN 14 06/23/2018   CREATININE 1.28 (H) 06/24/2018   CREATININE 1.50 (H) 06/23/2018   CREATININE 1.36 (H) 06/23/2018   CALCIUM 8.9 06/24/2018   CALCIUM 9.3 06/23/2018   CALCIUM 9.4 05/26/2018   LFT Recent Labs    06/23/18 1449  PROT 6.7  ALBUMIN 3.2*  AST 19  ALT 12  ALKPHOS 61  BILITOT 0.4   PT/INR Lab Results  Component Value Date   INR 1.22 06/23/2018   INR 1.16 08/05/2017   INR 1.08 08/30/2015   Hepatitis Panel No results for input(s): HEPBSAG, HCVAB, HEPAIGM, HEPBIGM in the last 72 hours. C-Diff No components found for: CDIFF Lipase  No results found for: LIPASE  Drugs of Abuse     Component Value Date/Time   LABOPIA NONE DETECTED 06/23/2018 1852   COCAINSCRNUR NONE DETECTED 06/23/2018 1852   LABBENZ NONE DETECTED 06/23/2018 1852   AMPHETMU NONE DETECTED 06/23/2018 1852   THCU NONE DETECTED 06/23/2018 1852   LABBARB NONE DETECTED 06/23/2018 1852     RADIOLOGY STUDIES: Ct Head Wo Contrast  Result Date: 06/23/2018 CLINICAL DATA:  77 year old male with a history of dizziness EXAM: CT HEAD WITHOUT CONTRAST TECHNIQUE: Contiguous axial images were obtained from the base of the skull through the vertex without  intravenous contrast. COMPARISON:  01/29/2018, 06/22/2015, MR 07/29/2015 FINDINGS: Brain: No acute intracranial hemorrhage. No midline shift or mass effect. Gray-white differentiation maintained. Unremarkable appearance of the ventricular system. Vascular: Calcifications of the intracranial vasculature. Skull: No acute fracture.  No aggressive bone lesion identified. Sinuses/Orbits: Bilateral globe/scleral surgery. Mastoid air cells clear. No middle ear effusion. No significant sinus disease. Other: None IMPRESSION: Negative for acute intracranial abnormality. Electronically Signed   By: Corrie Mckusick D.O.   On: 06/23/2018 15:43   Mr Brain Wo Contrast  Result Date: 06/23/2018 CLINICAL DATA:  Focal neuro deficit, > 6 hrs, stroke suspected EXAM: MRI HEAD WITHOUT CONTRAST TECHNIQUE: Multiplanar, multiecho pulse sequences of the brain and surrounding structures were obtained without intravenous contrast. COMPARISON:  06/23/2018 head CT Brain MRI 07/29/2015 FINDINGS: BRAIN: There is no acute infarct, acute hemorrhage or mass effect. The midline structures are normal. There are no old infarcts. Multifocal white matter hyperintensity, most commonly due to chronic ischemic microangiopathy. The CSF spaces are normal for age, with no hydrocephalus. Susceptibility-sensitive sequences show no chronic microhemorrhage or superficial siderosis. VASCULAR: Major intracranial arterial and venous sinus flow voids are preserved. SKULL AND UPPER CERVICAL SPINE: The visualized skull base, calvarium, upper cervical spine and extracranial soft tissues are normal. SINUSES/ORBITS: No fluid levels or advanced mucosal thickening. No mastoid or middle ear effusion. The orbits are  normal. IMPRESSION: Chronic ischemic microangiopathy without acute intracranial abnormality. Electronically Signed   By: Ulyses Jarred M.D.   On: 06/23/2018 21:01      IMPRESSION:   *  IDA.  Symptomatic with weakness, pre-syncope.   On Iron at home for at  least a couple of years.  Chronically dark stools.  Black, FOBT+ stool on DRE yesterday.    *   PAF.  S/p pacemaker.  On Eliquis.  Last dose 9/23 in AM.    *  Adenomatous colon polyp (polyps?) 2005, hyperplastic colon polyps 2008.  Overdue for surveillance colonoscopy as of 2015  *   GERD.  Infrequent sxs, controlled with prn Ranitidine.    *   ASPVD.  S/p multiple surgeries and perc interventions.   AAA, no change in 3.3 cm diameter as of doppler 2016 - 10/2017.    *   Hx CVA and a fib.  On Eliquis.    *    COPD, Emphysema, OSA, squamous cell lung Ca resected 2012.     PLAN:     *   Current measures in place to address anemia, FOBT+ stool include IV Pepcid, clear liquid diet.    *    D/w Dr Jerilynn Mages.  EGD and colonoscopy 11:30 AM tmrw, pt agreeable.      *   Hgb/hct 1600 today.  CBC in AM.       Azucena Freed  06/24/2018, 8:12 AM Phone [phone]

## 2018-06-24 NOTE — H&P (View-Only) (Signed)
[facility] Consult: 8:12 AM 06/24/2018  LOS: 0 days    Referring Provider: Dr Evangeline Gula  Primary Care Physician:  Binnie Rail, MD Primary Gastroenterologist:  Dr Olevia Perches,      Reason for Consultation: Anemia, FOBT positive.   HPI: Troy Duddy [name] is a 77 y.o. male.  PMH CAD. Afib, cardioversion 01/2018.   ASPVD, s/p fem-pop BPG , CEA and LE angioplasty/stents.  Hx TIA.  S/p paacemaker.  AAA.  GERD.  Htn.  Emphysema, COPD. Squamous cell lung cancer, S/p RLL lobectomy 2012.  OSA.  Gallstones on Ct chest 08/2017.   Hx falls. 05/2007 Colonoscopy.  Polyp surveillance, adenomas in 2005.  Three, 2 - 3 mm rectal polyps (hyperplastic).  Sigmoid tics.      Did not respond to call back letter from GI in 2015.    For a week or more he is been feeling malaise, lethargy, lack of energy.  On Sunday he was walking in the driveway and got wobbly and fell into the bushes.  He did not lose consciousness.  His equilibrium returned and he was able to get to the house and felt okay the rest of the day.  Monday morning his wife contacted Dr. Doren Custard, his vascular surgeon.  Patient was advised to proceed to the ED.    Hgb 10.7 >> 9.6.  Baseline in May ~ 13, 11.7 05/26/18.  MCV 88.   Iron 26.  Ferritin 9.   FOBT +.   BUN not elevated.   MRI and CT head: Nothing acute.  Chronic ischemic microangiopathy.    Baseline GI ROS.  No abdominal pain.  No nausea.  Intermittent constipation.  Uses mag oxide nightly.  Chronically dark stools ranging from formed to soft.  On chronic p.o. iron for at least 2 or 3 years.  Appetite stable.  Weight fluctuates by 3 or 4 pounds and has done this for many years.  Does not use aspirin or NSAIDs.  Drinks a couple of glasses of wine every few weeks.  Robust red wines because heartburn so he avoids these.  Uses  ranitidine at most a couple of times a month as needed for heartburn.   Is not noticed any unusual bleeding or bruising tendencies. Last took Eliquis in a.m. 06/23/2018     Past Medical History:  Diagnosis Date  . AAA (abdominal aortic aneurysm) (HCC) LAST ABD. [name]  JAN 2014  3.4CM   MONITORED BY DR [name]  . Arthritis   . Atrial fibrillation ([place])   . Benign essential HTN 01/19/2016  . Bilateral carotid artery stenosis DUPLEX 12-29-2012  BY DR [facility]   BILATERAL ICA STENOSIS 60-79%  . BPH (benign prostatic hypertrophy)   . Cancer ([place]) 01-25-12   LUNG  . Complication of anesthesia    had nausea after carotid artery surgery, states the medicine given to help the nausea, made it worse  . COPD (chronic obstructive pulmonary disease) ([place])   . Exertional dyspnea   . Frequency of urination   . GERD (gastroesophageal reflux disease)   . Glaucoma BOTH  EYES   Dr Gershon Crane  . Glaucoma   . History of basal cell carcinoma excision   . History of lung cancer APRIL 2012  SQUAMOUS CELL---- S/P RIGHT LOWER LOBECTOMY AT [facility] --  NO CHEMORADIATION---  NO RECURRENCE    ONCOLOGIST- DR Tressie Stalker  LOV IN [facility] 10-27-2012  . Hx of adenomatous colonic polyps 2005    X 2; 1 hyperplastic polyp; Dr Olevia Perches  . Hyperlipidemia   . Hypertension   . Impaired fasting glucose 2007   108; A1c5.4%  . Lesion of bladder   . Microhematuria   . Nocturia   . OSA (obstructive sleep apnea) 08/29/2015   Mild with AHI overall 6/hr but severe during REM sleep with AHI 30/hr with oxygen saturations < 88% for 5.5 minutes of total sleep time.    [name] PAD (peripheral artery disease) ([place]) ABI'S  JAN 2014  0.65 ON RIGHT ;  1.04 ON LEFT  . Peripheral vascular disease ([place]) S/P ANGIOPLASTY AND STENTING   FOLLOWED  BY DR [name]  . Spinal stenosis Sept. 2015  . Stroke [facility]) Jul 07, 2013   mini  TIA  . Urgency of urination     Past Surgical History:  Procedure Laterality Date  . ANGIO PLASTY     X 4 in legs  . AORTOGRAM   07-27-2002   MILD DIFFUSE ILIAC ARTERY OCCLUSIVE DISEASE /  LEFT RENAL ARTERY 20%/ PATENT LEFT FEM-POP GRAFT/ MILD SFA AND POPLITEAL ARTERY OCCLUSIVE DISEASE W/ SEVERE KIDNEY OCCLUSIVE DISEASE  . BASAL CELL CARCINOMA EXCISION     MULTIPLE TIMES--  RIGHT FOREARM, CHEEKS, AND BACK  . CARDIOVASCULAR STRESS TEST  12-08-2012  DR MCLEAN   NORMAL LEXISCAN WITH NO EXERCISE NUCLEAR STUDY/ EF 66%/   NO ISCHEMIA/ NO SIGNIFICANT CHANGE FROM PRIOR STUDY  . CARDIOVERSION N/A 02/14/2018   Procedure: CARDIOVERSION;  Surgeon: Larey Dresser, MD;  Location: [facility] ENDOSCOPY;  Service: Cardiovascular;  Laterality: N/A;  . CAROTID ANGIOGRAM N/A 07/10/2013   Procedure: CAROTID ANGIOGRAM;  Surgeon: Elam Dutch, MD;  Location: [facility] CATH LAB;  Service: Cardiovascular;  Laterality: N/A;  . CAROTID ENDARTERECTOMY Right 07-14-13   cea  . CATARACT EXTRACTION W/ INTRAOCULAR LENS  IMPLANT, BILATERAL    . colon polyectomy    . CYSTOSCOPY W/ RETROGRADES Bilateral 01/21/2013   Procedure: CYSTOSCOPY WITH RETROGRADE PYELOGRAM;  Surgeon: Molli Hazard, MD;  Location: [facility];  Service: Urology;  Laterality: Bilateral;   CYSTO, BLADDER BIOPSY, BILATERAL RETROGRADE PYELOGRAM  RAD TECH FROM RADIOLOGY PER JOY  . CYSTOSCOPY WITH BIOPSY N/A 01/21/2013   Procedure: CYSTOSCOPY WITH BIOPSY;  Surgeon: Molli Hazard, MD;  Location: [facility];  Service: Urology;  Laterality: N/A;  . ENDARTERECTOMY Right 07/14/2013   Procedure: ENDARTERECTOMY CAROTID;  Surgeon: Angelia Mould, MD;  Location: [place];  Service: Vascular;  Laterality: Right;  . EP IMPLANTABLE DEVICE N/A 08/12/2015   Procedure: Loop Recorder Insertion;  Surgeon: Thompson Grayer, MD;  Location: [place] CV LAB;  Service: Cardiovascular;  Laterality: N/A;  . FEMORAL-POPLITEAL BYPASS GRAFT Left [facility]   AND 2001  IN [place]  . FEMORAL-POPLITEAL BYPASS GRAFT Left 08/30/2015   Procedure: REVISION OF BYPASS GRAFT  Left  FEMORAL-POPLITEAL ARTERY;  Surgeon: Angelia Mould, MD;  Location: [place];  Service: Vascular;  Laterality: Left;  . LARYNGOSCOPY  06-27-2004   BX VOCAL CORD  (LEUKOPLAKIA)  PER PT NO ISSUES SINCE  . LEFT HEART CATH AND CORONARY ANGIOGRAPHY N/A  08/20/2017   Procedure: LEFT HEART CATH AND CORONARY ANGIOGRAPHY;  Surgeon: Larey Dresser, MD;  Location: [place] CV LAB;  Service: Cardiovascular;  Laterality: N/A;  . LOWER EXTREMITY ANGIOGRAM Bilateral 08/29/2015   Procedure: Lower Extremity Angiogram;  Surgeon: Angelia Mould, MD;  Location: [place] CV LAB;  Service: Cardiovascular;  Laterality: Bilateral;  . LUNG LOBECTOMY  01/24/2011    RIGHT UPPER LOBE  (SQUAMOUS CELL CARCINOMA) Dr Dorthea Cove , [facility]. No chemotherapyor radiation  . PATCH ANGIOPLASTY Right 07/14/2013   Procedure: PATCH ANGIOPLASTY;  Surgeon: Angelia Mould, MD;  Location: [place];  Service: Vascular;  Laterality: Right;  . PATCH ANGIOPLASTY Left 08/30/2015   Procedure: VEIN PATCH ANGIOPLASTY OF PROXIMAL Left BYPASS GRAFT;  Surgeon: Angelia Mould, MD;  Location: [place];  Service: Vascular;  Laterality: Left;  . PERIPHERAL VASCULAR CATHETERIZATION N/A 08/29/2015   Procedure: Abdominal Aortogram;  Surgeon: Angelia Mould, MD;  Location: [place] CV LAB;  Service: Cardiovascular;  Laterality: N/A;  . TEE WITHOUT CARDIOVERSION N/A 02/14/2018   Procedure: TRANSESOPHAGEAL ECHOCARDIOGRAM (TEE);  Surgeon: Larey Dresser, MD;  Location[facility] ENDOSCOPY;  Service: Cardiovascular;  Laterality: N/A;  . trabecular surgery     OS  . TRANSTHORACIC ECHOCARDIOGRAM  12-29-2012  DR [facility]   MILD LVH/  LVSF NORMAL/ EF 50-09%/  GRADE I DIASTOLIC DYSFUNCTION    Prior to Admission medications   Medication Sig Start Date End Date Taking? Authorizing Provider  acetaminophen (TYLENOL) 325 MG tablet Take 650 mg every 6 (six) hours as needed by mouth for moderate pain or headache.   Yes [provider]    amiodarone (PACERONE) 200 MG tablet TAKE 1/2 TABLET EVERY DAY. Patient taking differently: Take 100 mg by mouth daily.  01/21/18  Yes Larey Dresser, MD  bimatoprost (LUMIGAN) 0.01 % SOLN Place 1 drop at bedtime into both eyes.    Yes [provider]  Biotin 10000 MCG TABS Take 10,000 mcg daily by mouth.    Yes [provider]  dorzolamide-timolol (COSOPT) 22.3-6.8 MG/ML ophthalmic solution Place 2 drops into both eyes 2 (two) times daily.    Yes [provider]  ELIQUIS 5 MG TABS tablet TAKE 1 TABLET BY MOUTH TWICE DAILY. 04/14/18  Yes Larey Dresser, MD  FLUoxetine (PROZAC) 20 MG capsule TAKE (1) CAPSULE DAILY. Patient taking differently: Take 20 mg by mouth daily.  06/23/18  Yes Burns, Claudina Lick, MD  furosemide (LASIX) 20 MG tablet Take 1 tablet (20 mg total) by mouth daily. 02/05/18 02/05/19 Yes Sherran Needs, NP  iron polysaccharides (POLY-IRON 150) 150 MG capsule Take 150 mg by mouth at bedtime.    Yes [provider]  Magnesium 500 MG CAPS Take 500 mg by mouth at bedtime.   Yes [provider]  Melatonin 5 MG TABS Take 5 mg by mouth at bedtime.   Yes [provider]  montelukast (SINGULAIR) 10 MG tablet Take 1 tablet (10 mg total) by mouth at bedtime. 02/10/18  Yes Burns, Claudina Lick, MD  Multiple Vitamins-Minerals (CENTRUM SILVER 50+MEN) TABS Take 1 tablet by mouth at bedtime.   Yes [provider]  Polyethyl Glycol-Propyl Glycol (SYSTANE OP) Place 1-2 drops as needed into both eyes (for dry eyes).   Yes [provider]  potassium chloride SA (K-DUR,KLOR-CON) 20 MEQ tablet TAKE 1 TABLET ONCE DAILY. Patient taking differently: Take 20 mEq by mouth daily.  04/21/18  Yes Larey Dresser, MD  ranitidine (ZANTAC) 150 MG  tablet Take 150 mg by mouth daily as needed for heartburn.    Yes [provider]  rosuvastatin (CRESTOR) 40 MG tablet TAKE 1 TABLET ONCE DAILY. Patient taking differently: Take 40 mg by mouth at  bedtime.  01/08/18  Yes Larey Dresser, MD  budesonide-formoterol [facility]) 160-4.5 MCG/ACT inhaler Inhale 2 puffs into the lungs 2 (two) times daily. Patient not taking: Reported on 06/23/2018 01/20/18   Chesley Mires, MD  CARDURA 4 MG tablet TAKE (1/2) TABLET DAILY. Patient not taking: No sig reported 10/16/17   Binnie Rail, MD    Scheduled Meds: . amiodarone  100 mg Oral Daily  . dorzolamide-timolol  2 drop Both Eyes BID  . FLUoxetine  20 mg Oral Daily  . latanoprost  1 drop Both Eyes QHS  . magnesium oxide  400 mg Oral QHS  . Melatonin  6 mg Oral QHS  . montelukast  10 mg Oral QHS  . rosuvastatin  40 mg Oral QHS   Infusions: . sodium chloride 50 mL/hr at 06/24/18 0739  . famotidine (PEPCID) IV 20 mg (06/24/18 0741)   PRN Meds: acetaminophen **OR** acetaminophen, ondansetron **OR** ondansetron (ZOFRAN) IV   Allergies as of 06/23/2018 - Review Complete 06/23/2018  Allergen Reaction Noted  . Niacin-lovastatin er Shortness Of Breath and Other (See Comments)   . Penicillins Hives, Shortness Of Breath, and Other (See Comments)   . Sulfonamide derivatives Hives, Shortness Of Breath, and Other (See Comments)   . Atorvastatin Other (See Comments)     Family History  Problem Relation Age of Onset  . Stroke Mother        mini strokes  . Alcohol abuse Father   . Heart disease Father        MI after 19  . Stroke Father   . Hypertension Father   . Heart attack Father   . Heart disease Paternal Aunt        several  . Hypertension Paternal Aunt        several  . Stroke Paternal Aunt        several  . Stroke Paternal Uncle        several  . Heart disease Paternal Uncle        several;2 had MI pre 6    Social History   Socioeconomic History  . Marital status: Married    Spouse name: [name]   . Number of children: 3  . Years of education: 12+  . Highest education level: Not on file  Occupational History  . Not on file  Social Needs  . Financial resource strain: Not  on file  . Food insecurity:    Worry: Not on file    Inability: Not on file  . Transportation needs:    Medical: Not on file    Non-medical: Not on file  Tobacco Use  . Smoking status: Former Smoker    Packs/day: 0.50    Years: 50.00    Pack years: 25.00    Types: Cigarettes    Last attempt to quit: 07/29/2011    Years since quitting: 6.9  . Smokeless tobacco: Never Used  Substance and Sexual Activity  . Alcohol use: Yes    Alcohol/week: 0.0 standard drinks    Comment:  socially, variable  . Drug use: No  . Sexual activity: Not on file  Lifestyle  . Physical activity:    Days per week: Not on file    Minutes per session: Not on file  .  Stress: Not on file  Relationships  . Social connections:    Talks on phone: Not on file    Gets together: Not on file    Attends religious service: Not on file    Active member of club or organization: Not on file    Attends meetings of clubs or organizations: Not on file    Relationship status: Not on file  . Intimate partner violence:    Fear of current or ex partner: Not on file    Emotionally abused: Not on file    Physically abused: Not on file    Forced sexual activity: Not on file  Other Topics Concern  . Not on file  Social History Narrative   Patient lives at home with spouse [name]   Patient has 3 children    Patient is right handed     REVIEW OF SYSTEMS: Constitutional:  Per HPI ENT:  No nose bleeds Pulm: No new DOE, cough. CV: Intermittent A. fib which he is not always aware of.  Chest pain.  Chronic minor left LE swelling.  This is the leg that has had vascular interventions performed GU:  No hematuria, no frequency GI:  Per HPI Heme: No unusual bleeding or bruising. Transfusions: Previous transfusions 2014, before 1987 and prior to 2001 Neuro: Patient had been scheduled at [facility] ophthalmology for drainage shunts procedure to left eye to address glaucoma.  He postponed this so it would not interrupt a plan family  gathering and hunting season.  It is now scheduled for the first of the coming year 2020.  Because of his visual limitations, the patient only drives locally and during the daytime.  No headaches, no peripheral tingling or numbness Derm:  No itching, no rash or sores.  Endocrine:  No sweats or chills.  No polyuria or dysuria Immunization: Reviewed.  Does not look like he has had his flu shot for the current flu season yet. Travel:  None beyond local counties in last few months.    PHYSICAL EXAM: Vital signs in last 24 hours: Vitals:   06/23/18 2358 06/24/18 0517  BP: (!) 136/54 (!) 149/70  Pulse: (!) 48 (!) 54  Resp: 16 18  Temp: 98.2 F (36.8 C) 98.4 F (36.9 C)  SpO2: 97% 96%   Wt Readings from Last 3 Encounters:  06/23/18 90.9 kg  06/17/18 91.7 kg  05/29/18 92.4 kg    General: Pleasant, alert Head: No facial asymmetry or swelling.  No signs of head trauma. Eyes: No scleral icterus.  No conjunctival pallor.  EOMI. Ears: Not hard of hearing Nose: No congestion or discharge Mouth: Fair dentition, several missing teeth.  Tongue midline.  Mucosa pink, moist, clear. Neck: No JVD, no masses, no thyromegaly. Lungs: Clear bilaterally.  Absent breath sounds in the right lower lobe worries had lobectomy.  No labored breathing or cough Heart: RRR.  No MRG.  S1, S2 present. Abdomen: Soft.  No masses, HSM, bruits, hernias, organomegaly.  Not tender or distended.  Active bowel sounds..   Rectal: Deferred rectal exam.  FOBT +, black stool on DRE in ED yesterday. Musc/Skeltl: No joint redness, swelling or market deformities. Extremities: Slight, nonpitting swelling in the left foot/ankle. Neurologic: Slow response time but accurate, appropriate answers.  Moves all 4 limbs, strength not tested.  No tremors or gross weakness/deficits. Skin: Palmar erythema, no telangiectasia, sores or significant purpura/hematoma. Tattoos: None observed Nodes: No cervical adenopathy. Psych: Pleasant,  cooperative.  Intake/Output from previous day: No intake/output data  recorded. Intake/Output this shift: No intake/output data recorded.  LAB RESULTS: Recent Labs    06/23/18 1449 06/23/18 1502 06/24/18 0107 06/24/18 0607  WBC 5.4  --  6.0 5.0  HGB 10.7* 10.5* 10.4* 9.6*  HCT 35.2* 31.0* 33.6* 30.9*  PLT 265  --  266 235   BMET Lab Results  Component Value Date   NA 141 06/24/2018   NA 141 06/23/2018   NA 141 06/23/2018   K 4.0 06/24/2018   K 4.2 06/23/2018   K 4.3 06/23/2018   CL 107 06/24/2018   CL 107 06/23/2018   CL 108 06/23/2018   CO2 24 06/24/2018   CO2 26 06/23/2018   CO2 26 05/26/2018   GLUCOSE 121 (H) 06/24/2018   GLUCOSE 119 (H) 06/23/2018   GLUCOSE 124 (H) 06/23/2018   BUN 14 06/24/2018   BUN 16 06/23/2018   BUN 14 06/23/2018   CREATININE 1.28 (H) 06/24/2018   CREATININE 1.50 (H) 06/23/2018   CREATININE 1.36 (H) 06/23/2018   CALCIUM 8.9 06/24/2018   CALCIUM 9.3 06/23/2018   CALCIUM 9.4 05/26/2018   LFT Recent Labs    06/23/18 1449  PROT 6.7  ALBUMIN 3.2*  AST 19  ALT 12  ALKPHOS 61  BILITOT 0.4   PT/INR Lab Results  Component Value Date   INR 1.22 06/23/2018   INR 1.16 08/05/2017   INR 1.08 08/30/2015   Hepatitis Panel No results for input(s): HEPBSAG, HCVAB, HEPAIGM, HEPBIGM in the last 72 hours. C-Diff No components found for: CDIFF Lipase  No results found for: LIPASE  Drugs of Abuse     Component Value Date/Time   LABOPIA NONE DETECTED 06/23/2018 1852   COCAINSCRNUR NONE DETECTED 06/23/2018 1852   LABBENZ NONE DETECTED 06/23/2018 1852   AMPHETMU NONE DETECTED 06/23/2018 1852   THCU NONE DETECTED 06/23/2018 1852   LABBARB NONE DETECTED 06/23/2018 1852     RADIOLOGY STUDIES: Ct Head Wo Contrast  Result Date: 06/23/2018 CLINICAL DATA:  77 year old male with a history of dizziness EXAM: CT HEAD WITHOUT CONTRAST TECHNIQUE: Contiguous axial images were obtained from the base of the skull through the vertex without  intravenous contrast. COMPARISON:  01/29/2018, 06/22/2015, MR 07/29/2015 FINDINGS: Brain: No acute intracranial hemorrhage. No midline shift or mass effect. Gray-white differentiation maintained. Unremarkable appearance of the ventricular system. Vascular: Calcifications of the intracranial vasculature. Skull: No acute fracture.  No aggressive bone lesion identified. Sinuses/Orbits: Bilateral globe/scleral surgery. Mastoid air cells clear. No middle ear effusion. No significant sinus disease. Other: None IMPRESSION: Negative for acute intracranial abnormality. Electronically Signed   By: Corrie Mckusick D.O.   On: 06/23/2018 15:43   Mr Brain Wo Contrast  Result Date: 06/23/2018 CLINICAL DATA:  Focal neuro deficit, > 6 hrs, stroke suspected EXAM: MRI HEAD WITHOUT CONTRAST TECHNIQUE: Multiplanar, multiecho pulse sequences of the brain and surrounding structures were obtained without intravenous contrast. COMPARISON:  06/23/2018 head CT Brain MRI 07/29/2015 FINDINGS: BRAIN: There is no acute infarct, acute hemorrhage or mass effect. The midline structures are normal. There are no old infarcts. Multifocal white matter hyperintensity, most commonly due to chronic ischemic microangiopathy. The CSF spaces are normal for age, with no hydrocephalus. Susceptibility-sensitive sequences show no chronic microhemorrhage or superficial siderosis. VASCULAR: Major intracranial arterial and venous sinus flow voids are preserved. SKULL AND UPPER CERVICAL SPINE: The visualized skull base, calvarium, upper cervical spine and extracranial soft tissues are normal. SINUSES/ORBITS: No fluid levels or advanced mucosal thickening. No mastoid or middle ear effusion. The orbits are  normal. IMPRESSION: Chronic ischemic microangiopathy without acute intracranial abnormality. Electronically Signed   By: Ulyses Jarred M.D.   On: 06/23/2018 21:01      IMPRESSION:   *  IDA.  Symptomatic with weakness, pre-syncope.   On Iron at home for at  least a couple of years.  Chronically dark stools.  Black, FOBT+ stool on DRE yesterday.    *   PAF.  S/p pacemaker.  On Eliquis.  Last dose 9/23 in AM.    *  Adenomatous colon polyp (polyps?) 2005, hyperplastic colon polyps 2008.  Overdue for surveillance colonoscopy as of 2015  *   GERD.  Infrequent sxs, controlled with prn Ranitidine.    *   ASPVD.  S/p multiple surgeries and perc interventions.   AAA, no change in 3.3 cm diameter as of doppler 2016 - 10/2017.    *   Hx CVA and a fib.  On Eliquis.    *    COPD, Emphysema, OSA, squamous cell lung Ca resected 2012.     PLAN:     *   Current measures in place to address anemia, FOBT+ stool include IV Pepcid, clear liquid diet.    *    D/w Dr Jerilynn Mages.  EGD and colonoscopy 11:30 AM tmrw, pt agreeable.      *   Hgb/hct 1600 today.  CBC in AM.       Azucena Freed  06/24/2018, 8:12 AM Phone [phone]

## 2018-06-25 ENCOUNTER — Observation Stay (HOSPITAL_COMMUNITY): Payer: Commercial Managed Care - PPO

## 2018-06-25 ENCOUNTER — Observation Stay (HOSPITAL_COMMUNITY): Payer: Commercial Managed Care - PPO | Admitting: Anesthesiology

## 2018-06-25 ENCOUNTER — Encounter (HOSPITAL_COMMUNITY): Payer: Self-pay | Admitting: *Deleted

## 2018-06-25 ENCOUNTER — Encounter (HOSPITAL_COMMUNITY)
Admission: EM | Disposition: A | Discharge status: Home / Self Care | Payer: Self-pay | Source: Home / Self Care | Attending: Emergency Medicine

## 2018-06-25 DIAGNOSIS — C187 Malignant neoplasm of sigmoid colon: Secondary | ICD-10-CM | POA: Diagnosis not present

## 2018-06-25 DIAGNOSIS — G4733 Obstructive sleep apnea (adult) (pediatric): Secondary | ICD-10-CM

## 2018-06-25 DIAGNOSIS — K922 Gastrointestinal hemorrhage, unspecified: Secondary | ICD-10-CM

## 2018-06-25 DIAGNOSIS — D124 Benign neoplasm of descending colon: Secondary | ICD-10-CM

## 2018-06-25 DIAGNOSIS — I480 Paroxysmal atrial fibrillation: Secondary | ICD-10-CM

## 2018-06-25 DIAGNOSIS — D62 Acute posthemorrhagic anemia: Secondary | ICD-10-CM | POA: Diagnosis not present

## 2018-06-25 DIAGNOSIS — D122 Benign neoplasm of ascending colon: Secondary | ICD-10-CM

## 2018-06-25 DIAGNOSIS — K3189 Other diseases of stomach and duodenum: Secondary | ICD-10-CM | POA: Diagnosis not present

## 2018-06-25 DIAGNOSIS — K639 Disease of intestine, unspecified: Secondary | ICD-10-CM | POA: Diagnosis not present

## 2018-06-25 DIAGNOSIS — K2970 Gastritis, unspecified, without bleeding: Secondary | ICD-10-CM

## 2018-06-25 DIAGNOSIS — K228 Other specified diseases of esophagus: Secondary | ICD-10-CM

## 2018-06-25 DIAGNOSIS — D123 Benign neoplasm of transverse colon: Secondary | ICD-10-CM

## 2018-06-25 HISTORY — PX: COLONOSCOPY WITH PROPOFOL: SHX5780

## 2018-06-25 HISTORY — PX: ESOPHAGOGASTRODUODENOSCOPY (EGD) WITH PROPOFOL: SHX5813

## 2018-06-25 HISTORY — PX: POLYPECTOMY: SHX5525

## 2018-06-25 HISTORY — PX: SUBMUCOSAL INJECTION: SHX5543

## 2018-06-25 HISTORY — PX: BIOPSY: SHX5522

## 2018-06-25 LAB — CBC
HCT: 32.5 % — ABNORMAL LOW (ref 39.0–52.0)
Hemoglobin: 10.1 g/dL — ABNORMAL LOW (ref 13.0–17.0)
MCH: 27.4 pg (ref 26.0–34.0)
MCHC: 31.1 g/dL (ref 30.0–36.0)
MCV: 88.1 fL (ref 78.0–100.0)
PLATELETS: 244 10*3/uL (ref 150–400)
RBC: 3.69 MIL/uL — ABNORMAL LOW (ref 4.22–5.81)
RDW: 13.7 % (ref 11.5–15.5)
WBC: 4.8 10*3/uL (ref 4.0–10.5)

## 2018-06-25 LAB — FOLATE RBC
FOLATE, RBC: 1600 ng/mL (ref >498)
Folate, Hemolysate: 489.7 ng/mL
HEMATOCRIT: 30.6 % — AB (ref 37.5–51.0)

## 2018-06-25 SURGERY — COLONOSCOPY WITH PROPOFOL
Anesthesia: Monitor Anesthesia Care

## 2018-06-25 MED ORDER — LACTATED RINGERS IV SOLN
INTRAVENOUS | Status: DC | PRN
  Administered 2018-06-25: 13:00:00 via INTRAVENOUS

## 2018-06-25 MED ORDER — SPOT INK MARKER SYRINGE KIT
PACK | SUBMUCOSAL | Status: DC | PRN
  Administered 2018-06-25: 3 mL via SUBMUCOSAL

## 2018-06-25 MED ORDER — SPOT INK MARKER SYRINGE KIT
PACK | SUBMUCOSAL | Status: AC
  Filled 2018-06-25: qty 5

## 2018-06-25 MED ORDER — PROPOFOL 500 MG/50ML IV EMUL
INTRAVENOUS | Status: DC | PRN
  Administered 2018-06-25: 100 ug/kg/min via INTRAVENOUS

## 2018-06-25 MED ORDER — PHENYLEPHRINE HCL 10 MG/ML IJ SOLN
INTRAMUSCULAR | Status: DC | PRN
  Administered 2018-06-25: 60 ug via INTRAVENOUS

## 2018-06-25 MED ORDER — LIDOCAINE HCL (CARDIAC) PF 100 MG/5ML IV SOSY
PREFILLED_SYRINGE | INTRAVENOUS | Status: DC | PRN
  Administered 2018-06-25: 80 mg via INTRAVENOUS

## 2018-06-25 MED ORDER — PROPOFOL 10 MG/ML IV BOLUS
INTRAVENOUS | Status: DC | PRN
  Administered 2018-06-25: 10 mg via INTRAVENOUS
  Administered 2018-06-25: 20 mg via INTRAVENOUS

## 2018-06-25 MED ORDER — IOHEXOL 300 MG/ML  SOLN
100.0000 mL | Freq: Once | INTRAMUSCULAR | Status: AC | PRN
  Administered 2018-06-25: 100 mL via INTRAVENOUS

## 2018-06-25 SURGICAL SUPPLY — 25 items

## 2018-06-25 NOTE — Progress Notes (Signed)
Pt taking po contrast and tolerating well. Wife continues at bedside

## 2018-06-25 NOTE — Progress Notes (Signed)
Pt returns and sits in bedside chair. Wife at side. Pt talking on phone and denies pain.

## 2018-06-25 NOTE — Progress Notes (Signed)
Patient sleeping with no distress, respirations unlabored .

## 2018-06-25 NOTE — Transfer of Care (Signed)
Immediate Anesthesia Transfer of Care Note  Patient: Troy Kenner [name]  Procedure(s) Performed: COLONOSCOPY WITH PROPOFOL (N/A ) ESOPHAGOGASTRODUODENOSCOPY (EGD) WITH PROPOFOL (N/A ) BIOPSY POLYPECTOMY SUBMUCOSAL INJECTION  Patient Location: Endoscopy Unit  Anesthesia Type:MAC  Level of Consciousness: awake, alert  and oriented  Airway & Oxygen Therapy: Patient Spontanous Breathing  Post-op Assessment: Report given to RN, Post -op Vital signs reviewed and stable and Patient moving all extremities X 4  Post vital signs: Reviewed and stable  Last Vitals:  Vitals Value Taken Time  BP 123/54 06/25/2018  3:12 PM  Temp 36.3 C 06/25/2018  3:11 PM  Pulse 62 06/25/2018  3:15 PM  Resp 20 06/25/2018  3:15 PM  SpO2 97 % 06/25/2018  3:15 PM  Vitals shown include unvalidated device data.  Last Pain:  Vitals:   06/25/18 1511  TempSrc: Oral  PainSc: 0-No pain         Complications: No apparent anesthesia complications

## 2018-06-25 NOTE — Progress Notes (Signed)
Pt refused CPAP for the night. RT will continue to monitor as needed.  

## 2018-06-25 NOTE — Progress Notes (Signed)
Progress Note    Troy Russomanno [name]  ALP:[id number] DOB: [birth year]  DOA: 06/23/2018 PCP: Binnie Rail, MD    Brief Narrative:     Medical records reviewed and are as summarized below:  Troy Kenner [name] is an 77 y.o. male with history of paroxysmal atrial fibrillation, hypertension, TIA.  History of carotid endarterectomy, abdominal aortic aneurysm who was brought to the ER after patient was complaining of increasing weakness.  Patient states yesterday at home while walking he felt dizzy and fell onto the bushes.  He felt weak generalized but did not lose function of the hands or leg.  He was able to regain strength and walked up to his house.  Did not have any chest pain shortness of breath nausea vomiting abdominal pain.  Has been noticing dark stools last few days.  Denies taking any NSAIDs. On 9/25 had EGD/colonoscopy with a suspicious mass found in colon.  CEA, CT chest abd/pelvis ordered.  Assessment/Plan:   Principal Problem:   Acute GI bleeding Active Problems:   AAA (abdominal aortic aneurysm) (HCC)   Chronic renal disease, stage 3, moderately decreased glomerular filtration rate between 30-59 mL/min/1.73 square meter (HCC)   PAF (paroxysmal atrial fibrillation) (HCC)   OSA (obstructive sleep apnea)   Benign essential HTN   CAD (coronary artery disease)   Chronic diastolic CHF (congestive heart failure) (HCC)   Acute blood loss anemia  Oozing mass in the sigmoid colon -hold apixaban (will need long-term discussions about when or if to resume anti-cogulation) -CT chest/abd/pelvis -CEA in AM -path pending -holding on surgery/oncology consult as may follow up at another facility  Generalized weakness-likely related to anemia.   History of paroxysmal atrial fibrillation  -will need discussions with cardiology regarding long term use of anticoagulation  Sleep apnea on CPAP.  Chronic kidney disease stage III-creatinine  at baseline.  Hyperlipidemia on  statins.  History of abdominal aortic aneurysm denies any abdominal pain.  History of COPD -continue inhalers.   Family Communication/Anticipated D/C date and plan/Code Status   DVT prophylaxis: scd Code Status: Full Code.  Family Communication:  Disposition Plan: home 24-48 hours   Medical Consultants:    None.   Anti-Infectives:    None  Subjective:   hungry  Objective:    Vitals:   06/25/18 1212 06/25/18 1511 06/25/18 1520 06/25/18 1540  BP: (!) 188/83 (!) 123/54 130/64 (!) 147/69  Pulse: (!) 46 (!) 59 (!) 45 66  Resp: 17 14 17 16   Temp: 97.8 F (36.6 C) (!) 97.4 F (36.3 C)    TempSrc: Oral Oral    SpO2: 99% 98% 96% 97%  Weight:      Height:        Intake/Output Summary (Last 24 hours) at 06/25/2018 1617 Last data filed at 06/25/2018 1500 Gross per 24 hour  Intake 500 ml  Output -  Net 500 ml   Filed Weights   06/23/18 1818 06/23/18 2152  Weight: 90.7 kg 90.9 kg    Exam: Hungry NAD  No increased work of breathing No LE edema  Data Reviewed:   I have personally reviewed following labs and imaging studies:  Labs: Labs show the following:   Basic Metabolic Panel: Recent Labs  Lab 06/23/18 1449 06/23/18 1502 06/24/18 0107  NA 141 141 141  K 4.3 4.2 4.0  CL 108 107 107  CO2 26  --  24  GLUCOSE 124* 119* 121*  BUN 14 16 14   CREATININE 1.36* 1.50* 1.28*  CALCIUM 9.3  --  8.9   GFR Estimated Creatinine Clearance: 54.6 mL/min (A) (by C-G formula based on SCr of 1.28 mg/dL (H)). Liver Function Tests: Recent Labs  Lab 06/23/18 1449  AST 19  ALT 12  ALKPHOS 61  BILITOT 0.4  PROT 6.7  ALBUMIN 3.2*   No results for input(s): LIPASE, AMYLASE in the last 168 hours. No results for input(s): AMMONIA in the last 168 hours. Coagulation profile Recent Labs  Lab 06/23/18 1449  INR 1.22    CBC: Recent Labs  Lab 06/23/18 1449 06/23/18 1502 06/24/18 0107 06/24/18 0607 06/24/18 1520 06/25/18 0550  WBC 5.4  --  6.0 5.0  --   4.8  NEUTROABS 3.8  --   --   --   --   --   HGB 10.7* 10.5* 10.4* 9.6* 10.4* 10.1*  HCT 35.2* 31.0* 33.6* 30.9* 32.9* 32.5*  MCV 90.7  --  88.4 88.5  --  88.1  PLT 265  --  266 235  --  244   Cardiac Enzymes: Recent Labs  Lab 06/24/18 0750  TROPONINI <0.03   BNP (last 3 results) No results for input(s): PROBNP in the last 8760 hours. CBG: No results for input(s): GLUCAP in the last 168 hours. D-Dimer: No results for input(s): DDIMER in the last 72 hours. Hgb A1c: No results for input(s): HGBA1C in the last 72 hours. Lipid Profile: No results for input(s): CHOL, HDL, LDLCALC, TRIG, CHOLHDL, LDLDIRECT in the last 72 hours. Thyroid function studies: Recent Labs    06/24/18 0750  TSH 0.751   Anemia work up: Recent Labs    06/24/18 0607  VITAMINB12 454  FERRITIN 9*  TIBC 378  IRON 26*  RETICCTPCT 1.5   Sepsis Labs: Recent Labs  Lab 06/23/18 1449 06/24/18 0107 06/24/18 0607 06/25/18 0550  WBC 5.4 6.0 5.0 4.8    Microbiology No results found for this or any previous visit (from the past 240 hour(s)).  Procedures and diagnostic studies:  Mr Brain Wo Contrast  Result Date: 06/23/2018 CLINICAL DATA:  Focal neuro deficit, > 6 hrs, stroke suspected EXAM: MRI HEAD WITHOUT CONTRAST TECHNIQUE: Multiplanar, multiecho pulse sequences of the brain and surrounding structures were obtained without intravenous contrast. COMPARISON:  06/23/2018 head CT Brain MRI 07/29/2015 FINDINGS: BRAIN: There is no acute infarct, acute hemorrhage or mass effect. The midline structures are normal. There are no old infarcts. Multifocal white matter hyperintensity, most commonly due to chronic ischemic microangiopathy. The CSF spaces are normal for age, with no hydrocephalus. Susceptibility-sensitive sequences show no chronic microhemorrhage or superficial siderosis. VASCULAR: Major intracranial arterial and venous sinus flow voids are preserved. SKULL AND UPPER CERVICAL SPINE: The visualized  skull base, calvarium, upper cervical spine and extracranial soft tissues are normal. SINUSES/ORBITS: No fluid levels or advanced mucosal thickening. No mastoid or middle ear effusion. The orbits are normal. IMPRESSION: Chronic ischemic microangiopathy without acute intracranial abnormality. Electronically Signed   By: Ulyses Jarred M.D.   On: 06/23/2018 21:01    Medications:   . amiodarone  100 mg Oral Daily  . dorzolamide-timolol  2 drop Both Eyes BID  . FLUoxetine  20 mg Oral Daily  . latanoprost  1 drop Both Eyes QHS  . magnesium oxide  400 mg Oral QHS  . Melatonin  6 mg Oral QHS  . montelukast  10 mg Oral QHS  . pantoprazole  40 mg Oral BID  . rosuvastatin  40 mg Oral QHS   Continuous Infusions:  LOS: 0 days   Geradine Girt  [facility]   *Please refer to [email or web address], password [email or web address] to get updated schedule on who will round on this patient, as hospitalists switch teams weekly. If 7PM-7AM, please contact night-coverage at [email or web address], password [email or web address] for any overnight needs.  06/25/2018, 4:17 PM

## 2018-06-25 NOTE — Anesthesia Preprocedure Evaluation (Addendum)
Anesthesia Evaluation  Patient identified by MRN, date of birth, ID band Patient awake    Reviewed: Allergy & Precautions, NPO status , Patient's Chart, lab work & pertinent test results  History of Anesthesia Complications (+) PONV and history of anesthetic complications  Airway Mallampati: II  TM Distance: >3 FB Neck ROM: Full    Dental  (+) Dental Advisory Given   Pulmonary sleep apnea and Continuous Positive Airway Pressure Ventilation , COPD, former smoker,    breath sounds clear to auscultation       Cardiovascular hypertension, Pt. on medications + CAD and + Peripheral Vascular Disease  + dysrhythmias Atrial Fibrillation  Rhythm:Regular Rate:Normal  Study Conclusions  - Left ventricle: The cavity size was normal. Wall thickness was   increased in a pattern of mild LVH. Systolic function was normal.   The estimated ejection fraction was in the range of 55% to 60%.   Wall motion was normal; there were no regional wall motion   abnormalities. No evidence of thrombus. - Aortic valve: There was no stenosis. - Aorta: Normal caliber thoracic aorta with minimal plaque. - Mitral valve: There was mild regurgitation. - Left atrium: The atrium was mildly dilated. No evidence of   thrombus in the atrial cavity or appendage. No evidence of   thrombus in the atrial cavity or appendage. - Right ventricle: The cavity size was normal. Systolic function   was normal. - Right atrium: No evidence of thrombus in the atrial cavity or   appendage.   Neuro/Psych PSYCHIATRIC DISORDERS Depression TIACVA    GI/Hepatic Neg liver ROS, GERD  ,  Endo/Other  negative endocrine ROS  Renal/GU Renal InsufficiencyRenal disease     Musculoskeletal  (+) Arthritis ,   Abdominal   Peds  Hematology  (+) anemia ,   Anesthesia Other Findings   Reproductive/Obstetrics                            Lab Results  Component  Value Date   WBC 4.8 06/25/2018   HGB 10.1 (L) 06/25/2018   HCT 32.5 (L) 06/25/2018   MCV 88.1 06/25/2018   PLT 244 06/25/2018   Lab Results  Component Value Date   CREATININE 1.28 (H) 06/24/2018   BUN 14 06/24/2018   NA 141 06/24/2018   K 4.0 06/24/2018   CL 107 06/24/2018   CO2 24 06/24/2018    Anesthesia Physical  Anesthesia Plan  ASA: III  Anesthesia Plan: MAC   Post-op Pain Management:    Induction: Intravenous  PONV Risk Score and Plan: Treatment may vary due to age or medical condition  Airway Management Planned: Nasal Cannula  Additional Equipment:   Intra-op Plan:   Post-operative Plan:   Informed Consent: I have reviewed the patients History and Physical, chart, labs and discussed the procedure including the risks, benefits and alternatives for the proposed anesthesia with the patient or authorized representative who has indicated his/her understanding and acceptance.   Dental advisory given  Plan Discussed with: CRNA  Anesthesia Plan Comments:         Anesthesia Quick Evaluation

## 2018-06-25 NOTE — Anesthesia Postprocedure Evaluation (Signed)
Anesthesia Post Note  Patient: [name]  Procedure(s) Performed: COLONOSCOPY WITH PROPOFOL (N/A ) ESOPHAGOGASTRODUODENOSCOPY (EGD) WITH PROPOFOL (N/A ) BIOPSY POLYPECTOMY SUBMUCOSAL INJECTION     Patient location during evaluation: PACU Anesthesia Type: MAC Level of consciousness: awake and alert Pain management: pain level controlled Vital Signs Assessment: post-procedure vital signs reviewed and stable Respiratory status: spontaneous breathing and respiratory function stable Cardiovascular status: stable Postop Assessment: no apparent nausea or vomiting Anesthetic complications: no    Last Vitals:  Vitals:   06/25/18 1540 06/25/18 1626  BP: (!) 147/69 (!) 174/76  Pulse: 66 60  Resp: 16 16  Temp:  36.5 C  SpO2: 97% 98%    Last Pain:  Vitals:   06/25/18 1626  TempSrc: Oral  PainSc:                  [name]

## 2018-06-25 NOTE — Anesthesia Procedure Notes (Signed)
Procedure Name: MAC Date/Time: 06/25/2018 2:09 PM Performed by: Mariea Clonts, CRNA Pre-anesthesia Checklist: Patient identified, Emergency Drugs available, Suction available, Patient being monitored and Timeout performed Patient Re-evaluated:Patient Re-evaluated prior to induction Oxygen Delivery Method: Nasal cannula

## 2018-06-25 NOTE — Op Note (Addendum)
[facility] Patient Name: Troy [name] Procedure Date : 06/25/2018 MRN: [id number] Attending MD: Justice Britain , MD Date of Birth: [birth year] CSN: [id number] Age: 77 Admit Type: Inpatient Procedure:                Colonoscopy Indications:              Last colonoscopy 10 years ago, Unexplained iron                            deficiency anemia Providers:                Justice Britain, MD, Burtis Junes, RN, William Dalton, Technician Referring MD:              Medicines:                Monitored Anesthesia Care Complications:            No immediate complications. Estimated Blood Loss:     Estimated blood loss was minimal. Procedure:                Pre-Anesthesia Assessment:                           - Prior to the procedure, a History and Physical                            was performed, and patient medications and                            allergies were reviewed. The patient's tolerance of                            previous anesthesia was also reviewed. The risks                            and benefits of the procedure and the sedation                            options and risks were discussed with the patient.                            All questions were answered, and informed consent                            was obtained. Prior Anticoagulants: The patient has                            taken Eliquis (apixaban), last dose was 2 days                            prior to procedure. ASA Grade Assessment: II - A  patient with mild systemic disease. After reviewing                            the risks and benefits, the patient was deemed in                            satisfactory condition to undergo the procedure.                           After obtaining informed consent, the colonoscope                            was passed under direct vision. Throughout the                            procedure, the  patient's blood pressure, pulse, and                            oxygen saturations were monitored continuously. The                            CF-HQ190L ([id number] Olympus adult colon was                            introduced through the anus and advanced to the 10                            cm into the ileum. The colonoscopy was performed                            without difficulty. The patient tolerated the                            procedure well. The quality of the bowel                            preparation was evaluated using the BBPS [facility]                            Bowel Preparation Scale) with scores of: Right                            Colon = 2 (minor amount of residual staining, small                            fragments of stool and/or opaque liquid, but mucosa                            seen well), Transverse Colon = 2 (minor amount of                            residual staining, small fragments of stool and/or  opaque liquid, but mucosa seen well) and Left Colon                            = 3 (entire mucosa seen well with no residual                            staining, small fragments of stool or opaque                            liquid). The total BBPS score equals 7. The quality                            of the bowel preparation was fair. Scope In: 2:23:03 PM Scope Out: 3:03:38 PM Scope Withdrawal Time: 0 hours 33 minutes 48 seconds  Total Procedure Duration: 0 hours 40 minutes 35 seconds  Findings:      The perianal examination was normal.      The terminal ileum and ileocecal valve appeared normal.      Three sessile polyps were found in the ascending colon. The polyps were       5 to 8 mm in size. These polyps were removed with a hot snare. Resection       and retrieval were complete.      Three sessile polyps were found in the descending colon, hepatic flexure       and ascending colon. The polyps were 3 to 6 mm in size. These polyps        were removed with a cold snare. Resection and retrieval were complete.      A frond-like/villous, fungating, polypoid and ulcerated partially       obstructing medium-sized mass was found in the sigmoid colon. The mass       was partially circumferential (involving one-third of the lumen       circumference). The mass measured four cm in length. Oozing was present.       Biopsies were taken with a cold forceps for histology. Area was tattooed       proximal to the mass with an injection of Spot (carbon black).      Multiple small and large-mouthed diverticula were found in the       recto-sigmoid colon, sigmoid colon and descending colon.      Non-bleeding non-thrombosed internal hemorrhoids were found during       retroflexion. The hemorrhoids were Grade I (internal hemorrhoids that do       not prolapse). Impression:               - Preparation of the colon was fair.                           - The examined portion of the ileum was normal.                           - Three 5 to 8 mm polyps in the ascending colon,                            removed with a hot snare. Resected and retrieved.                           -  Three 3 to 6 mm polyps in the descending colon,                            at the hepatic flexure and in the ascending colon,                            removed with a cold snare. Resected and retrieved.                           - Rule out malignancy, partially obstructing tumor                            in the sigmoid colon. Biopsied. Tattooed proximally.                           - Diverticulosis in the recto-sigmoid colon, in the                            sigmoid colon and in the descending colon.                           - Non-bleeding non-thrombosed internal hemorrhoids. Recommendation:           - The patient will be observed post-procedure,                            until all discharge criteria are met.                           - Return patient to hospital  ward for ongoing care.                           - Await pathology results.                           - Will proceed with obtaining a                            CT-Chest/Abdomen/Pelvis with contrast. Would send a                            CEA level as well with next blood draw.                           - Difficult to know when to restart anticoagulation                            but would say at least 48-72 hours with                            polypectomies and with the oozing mass may need to                            be longer.                           -  The findings and recommendations were discussed                            with the patient.                           - The findings and recommendations were discussed                            with the patient's family.                           - The findings and recommendations were discussed                            with the referring physician. Procedure Code(s):        --- Professional ---                           [phone], Colonoscopy, flexible; with removal of                            tumor(s), polyp(s), or other lesion(s) by snare                            technique                           [id number], 16, Colonoscopy, flexible; with biopsy,                            single or multiple Diagnosis Code(s):        --- Professional ---                           K64.0, First degree hemorrhoids                           D12.4, Benign neoplasm of descending colon                           D12.3, Benign neoplasm of transverse colon (hepatic                            flexure or splenic flexure)                           D12.2, Benign neoplasm of ascending colon                           D49.0, Neoplasm of unspecified behavior of                            digestive system                           K56.690, Other partial intestinal obstruction  D50.9, Iron deficiency anemia, unspecified                            K57.30, Diverticulosis of large intestine without                            perforation or abscess without bleeding CPT copyright 2017 [employer]. All rights reserved. The codes documented in this report are preliminary and upon coder review may  be revised to meet current compliance requirements. Justice Britain, MD 06/25/2018 3:30:15 PM Number of Addenda: 1 Addendum Number: 1   Addendum Date: 06/25/2018 4:44:24 PM      The colon mass was noted from 35 cm to 39 cm. Justice Britain, MD 06/25/2018 4:44:39 PM

## 2018-06-25 NOTE — Progress Notes (Signed)
Pt to ct via wc. Denies pain.

## 2018-06-25 NOTE — Op Note (Signed)
[facility] Patient Name: Troy [name] Procedure Date : 06/25/2018 MRN: [id number] Attending MD: Justice Britain , MD Date of Birth: 24-Dec-1940 CSN: [id number] Age: 77 Admit Type: Inpatient Procedure:                Upper GI endoscopy Indications:              Suspected upper gastrointestinal bleeding in                            patient with unexplained iron deficiency anemia Providers:                Justice Britain, MD, Burtis Junes, RN, William Dalton, Technician Referring MD:              Medicines:                Monitored Anesthesia Care Complications:            No immediate complications. Estimated Blood Loss:     Estimated blood loss was minimal. Procedure:                Pre-Anesthesia Assessment:                           - Prior to the procedure, a History and Physical                            was performed, and patient medications and                            allergies were reviewed. The patient's tolerance of                            previous anesthesia was also reviewed. The risks                            and benefits of the procedure and the sedation                            options and risks were discussed with the patient.                            All questions were answered, and informed consent                            was obtained. Prior Anticoagulants: The patient has                            taken Eliquis (apixaban), last dose was 2 days                            prior to procedure. ASA Grade Assessment: II - A  patient with mild systemic disease. After reviewing                            the risks and benefits, the patient was deemed in                            satisfactory condition to undergo the procedure.                           After obtaining informed consent, the endoscope was                            passed under direct vision. Throughout the              procedure, the patient's blood pressure, pulse, and                            oxygen saturations were monitored continuously. The                            [id number]-H190 ([id number]) Olympus Adult EGD was introduced                            through the mouth, and advanced to the third part                            of duodenum. The upper GI endoscopy was                            accomplished without difficulty. The patient                            tolerated the procedure well. Scope In: Scope Out: Findings:      No gross lesions were noted in the proximal esophagus and in the mid       esophagus.      Scattered islands of salmon-colored mucosa were present at 45 cm. No       other visible abnormalities were present. Biopsies were taken with a       cold forceps for histology.      The Z-line was irregular and was found 46 cm from the incisors.      No gross lesions were noted in the entire examined stomach. Biopsies       were taken with a cold forceps for histology and Helicobacter pylori       testing from the antrum/incisura/greater curve/lesser curve.      Patchy mildly erythematous mucosa without active bleeding and with no       stigmata of bleeding was found in the duodenal bulb.      No other gross lesions were noted in the second portion of the duodenum       and in the third portion of the duodenum. Biopsies were taken with a       cold forceps for histology from all regions of the duodenum that was       visualized. Impression:               -  No gross lesions in esophagus.                           - Salmon-colored mucosa suspicious for Barrett's                            esophagus. Biopsied to rule out Barrett's.                           - Z-line irregular, 46 cm from the incisors.                           - No gross lesions in the stomach. Biopsied for HP.                           - Erythematous duodenopathy in bulb. No other gross                             lesions in the second portion of the duodenum and                            in the third portion of the duodenum. Biopsied to                            rule out Celiac. Recommendation:           - Proceed to scheduled Colonoscopy for further                            evaluation of IDA.                           - Await pathology results.                           - Maintain at least once daily PPI.                           - Observe patient's clinical course.                           - The findings and recommendations were discussed                            with the referring physician.                           - The findings and recommendations were discussed                            with the patient.                           - The findings and recommendations were discussed  with the patient's family. Procedure Code(s):        --- Professional ---                           [phone], Esophagogastroduodenoscopy, flexible,                            transoral; with biopsy, single or multiple Diagnosis Code(s):        --- Professional ---                           K22.8, Other specified diseases of esophagus                           K31.89, Other diseases of stomach and duodenum                           D50.9, Iron deficiency anemia, unspecified CPT copyright 2017 American Medical Association. All rights reserved. The codes documented in this report are preliminary and upon coder review may  be revised to meet current compliance requirements. Justice Britain, MD 06/25/2018 3:17:39 PM Number of Addenda: 0

## 2018-06-25 NOTE — Progress Notes (Signed)
Pt returns from EGD, A&o x3. Maew resp even and non labored. Wife at side. Drinking fluids and eating crackers.

## 2018-06-25 NOTE — Interval H&P Note (Signed)
History and Physical Interval Note:  06/25/2018 1:52 PM  Troy Kenner [name]  has presented today for surgery, with the diagnosis of anemia, FOBT +.  last Eliquis was AM 9/23  The various methods of treatment have been discussed with the patient and family. After consideration of risks, benefits and other options for treatment, the patient has consented to  Procedure(s): COLONOSCOPY WITH PROPOFOL (N/A) ESOPHAGOGASTRODUODENOSCOPY (EGD) WITH PROPOFOL (N/A) as a surgical intervention .  The patient's history has been reviewed, patient examined, no change in status, stable for surgery.  I have reviewed the patient's chart and labs.  Questions were answered to the patient's satisfaction.     [employer]

## 2018-06-26 ENCOUNTER — Encounter (HOSPITAL_COMMUNITY): Payer: Self-pay | Admitting: Gastroenterology

## 2018-06-26 ENCOUNTER — Encounter (HOSPITAL_COMMUNITY): Payer: Commercial Managed Care - PPO

## 2018-06-26 DIAGNOSIS — C187 Malignant neoplasm of sigmoid colon: Secondary | ICD-10-CM | POA: Diagnosis not present

## 2018-06-26 DIAGNOSIS — K922 Gastrointestinal hemorrhage, unspecified: Secondary | ICD-10-CM | POA: Diagnosis not present

## 2018-06-26 DIAGNOSIS — N183 Chronic kidney disease, stage 3 (moderate): Secondary | ICD-10-CM | POA: Diagnosis not present

## 2018-06-26 DIAGNOSIS — I10 Essential (primary) hypertension: Secondary | ICD-10-CM

## 2018-06-26 DIAGNOSIS — R229 Localized swelling, mass and lump, unspecified: Secondary | ICD-10-CM

## 2018-06-26 DIAGNOSIS — D62 Acute posthemorrhagic anemia: Secondary | ICD-10-CM | POA: Diagnosis not present

## 2018-06-26 LAB — CBC
HEMATOCRIT: 29.8 % — AB (ref 39.0–52.0)
HEMOGLOBIN: 9.5 g/dL — AB (ref 13.0–17.0)
MCH: 28 pg (ref 26.0–34.0)
MCHC: 31.9 g/dL (ref 30.0–36.0)
MCV: 87.9 fL (ref 78.0–100.0)
Platelets: 228 10*3/uL (ref 150–400)
RBC: 3.39 MIL/uL — ABNORMAL LOW (ref 4.22–5.81)
RDW: 13.6 % (ref 11.5–15.5)
WBC: 6.9 10*3/uL (ref 4.0–10.5)

## 2018-06-26 LAB — BASIC METABOLIC PANEL
ANION GAP: 6 (ref 5–15)
BUN: 14 mg/dL (ref 8–23)
CALCIUM: 8.4 mg/dL — AB (ref 8.9–10.3)
CHLORIDE: 111 mmol/L (ref 98–111)
CO2: 23 mmol/L (ref 22–32)
CREATININE: 1.25 mg/dL — AB (ref 0.61–1.24)
GFR calc non Af Amer: 54 mL/min — ABNORMAL LOW (ref >60)
Glucose, Bld: 146 mg/dL — ABNORMAL HIGH (ref 70–99)
Potassium: 3.8 mmol/L (ref 3.5–5.1)
SODIUM: 140 mmol/L (ref 135–145)

## 2018-06-26 MED ORDER — AMIODARONE HCL 200 MG PO TABS: 100.0000 mg | ORAL_TABLET | Freq: Every day | ORAL | Status: DC

## 2018-06-26 NOTE — H&P (View-Only) (Signed)
[name] Progress Note   Chief Complaint:   Colon mass / anemia  SUBJECTIVE:   Feels fine, ate well following colonoscopy yesterday.   ASSESSMENT AND PLAN:   1. 77 yo male with IDA / several polyps and sigmoid colon mass on colonoscopy yesterday. Baseline hgb 12-13, currently stable in at mid 9 to 10 range  -awaiting path.  -staging CT scan --no evidence for mets in abd / pelvis . Multiple, very small peripheral nodules thorughout both upper lung lobes, nonspecific. Enlarging superior right paratracheal lymph node -regarding Eliquis, would ask that it be held for two additional days given polypectomy. Will defer to Cardiology regarding longterm need for Eliquis.   ADDENDUM: biopsies back. Mild inflammation on esophagus, no Barrett's . Polyps were tubular adenomas without HGD. Sigmoid mass c/w adenocarcinoma. He is interested in treatment in [place]  2. Hc of CVA / PAF / pacemaker, on Eliquis at home / COPD  3. GERD / endoscopically appeared to have Barrett's -await biopsies -continue PPI  4. Cholelithiasis  5. AAA, 3.8 on CT scan   OBJECTIVE:     Vital signs in last 24 hours: Temp:  [97.4 F (36.3 C)-99.6 F (37.6 C)] 98 F (36.7 C) (09/26 0900) Pulse Rate:  [45-66] 61 (09/26 0900) Resp:  [14-20] 20 (09/26 0900) BP: (116-188)/(47-111) 150/69 (09/26 0900) SpO2:  [96 %-100 %] 96 % (09/26 0626) Weight:  [91.6 kg] 91.6 kg (09/26 0626) Last BM Date: 06/25/18 General:   Alert, well-developed, male in NAD EENT:  Normal hearing, non icteric sclera, conjunctive pink.  Heart:  Regular rate and rhythm; no lower extremity edema Pulm: Normal respiratory effort, Abdomen:  Soft, nondistended, nontender.  Normal bowel sounds, no masses felt.     Neurologic:  Alert and  oriented x4;  grossly normal neurologically. Psych:  Pleasant, cooperative.  Normal mood and affect.   Intake/Output from previous day: 09/25 0701 - 09/26 0700 In: 500 [I.V.:500] Out: -    Intake/Output this shift: No intake/output data recorded.  Lab Results: Recent Labs    06/24/18 0607 06/24/18 1520 06/25/18 0550 06/26/18 0550  WBC 5.0  --  4.8 6.9  HGB 9.6* 10.4* 10.1* 9.5*  HCT 30.9*  30.6* 32.9* 32.5* 29.8*  PLT 235  --  244 228   BMET Recent Labs    06/23/18 1449 06/23/18 1502 06/24/18 0107 06/26/18 0550  NA 141 141 141 140  K 4.3 4.2 4.0 3.8  CL 108 107 107 111  CO2 26  --  24 23  GLUCOSE 124* 119* 121* 146*  BUN 14 16 14 14   CREATININE 1.36* 1.50* 1.28* 1.25*  CALCIUM 9.3  --  8.9 8.4*   LFT Recent Labs    06/23/18 1449  PROT 6.7  ALBUMIN 3.2*  AST 19  ALT 12  ALKPHOS 61  BILITOT 0.4   PT/INR Recent Labs    06/23/18 1449  LABPROT 15.3*  INR 1.22   Ct Chest W Contrast  Result Date: 06/25/2018 CLINICAL DATA:  New diagnosis of colon cancer. Staging. Prior RIGHT LOWER lobectomy in April, 2012 for a squamous cell carcinoma (performed at [facility]). EXAM: CT CHEST, ABDOMEN, AND PELVIS WITH CONTRAST TECHNIQUE: Multidetector CT imaging of the chest, abdomen and pelvis was performed following the standard protocol during bolus administration of intravenous contrast. CONTRAST:  16mL OMNIPAQUE IOHEXOL 300 MG/ML IV. Oral contrast was also administered. COMPARISON:  High-resolution CT chest 08/13/2017. CT abdomen and pelvis 10/26/2015. FINDINGS: CT CHEST FINDINGS  Cardiovascular: Cardiac silhouette upper normal in size. Moderate three-vessel coronary atherosclerosis. No pericardial effusion. Severe atherosclerosis involving the thoracic aorta without evidence of aneurysm. Proximal great vessels atherosclerotic though widely patent. Mediastinum/Nodes: RIGHT SUPERIOR paratracheal mediastinal lymph node (station 2R) measuring approximately 2.2 x 1.4 cm, increased in size since the November, 2018 CT. Multiple normal-sized lymph nodes elsewhere in the mediastinum which appear stable. Normal appearing esophagus. Normal-appearing thyroid  gland. Lungs/Pleura: Prior RIGHT LOWER lobectomy. Pleural scarring/thickening laterally and posteriorly at the RIGHT base with a chronic RIGHT pleural effusion, unchanged. Parenchymal scarring in the RIGHT MIDDLE LOBE, unchanged. Very small peripheral nodules are present in the BILATERAL UPPER lobes, none of which are more than 2-3 mm in size. No dominant nodules in either lung. No confluent airspace consolidation. No LEFT pleural effusion. Musculoskeletal: Degenerative changes involving the thoracic spine. No acute findings. No evidence of osseous metastatic disease. CT ABDOMEN PELVIS FINDINGS Hepatobiliary: Diffuse hepatic steatosis without focal hepatic parenchymal abnormality. Solitary calcified gallstone in the gallbladder measuring approximately 8 mm. No evidence of acute cholecystitis. No biliary ductal dilation. Pancreas: Mild pancreatic atrophy. No pancreatic mass or peripancreatic edema/inflammation. Spleen: Normal in appearance for the early arterial phase of enhancement. Adrenals/Urinary Tract: Normal appearing adrenal glands. Benign 1.8 cm cyst arising from the UPPER pole of the RIGHT kidney. Scarring with severe cortical thinning involving the LOWER pole the LEFT kidney. No solid masses involving either kidney. No hydronephrosis. No urinary tract calculi. Normal appearing decompressed urinary bladder. Stomach/Bowel: Normal appearing stomach filled with oral contrast and food. Diverticulum arising medially from the descending duodenum as noted previously, with oral contrast material dependently in the diverticulum. Remainder of the small bowel normal in appearance. Mass involving the relatively decompressed sigmoid colon corresponding to the patient's known cancer. Distal descending and proximal sigmoid colon diverticulosis without evidence of acute diverticulitis. Normal-appearing long appendix in the RIGHT mid and UPPER pelvis. Vascular/Lymphatic: Severe aortoiliofemoral atherosclerosis. Saccular  infrarenal abdominal aortic aneurysm with maximum diameter 3.8 cm. No pathologic lymphadenopathy. Reproductive: Moderate prostate gland enlargement, particularly the median lobe, indenting the base of the urinary bladder. Normal-appearing seminal vesicles. Other: Small BILATERAL inguinal hernias containing fat. Musculoskeletal: Degenerative disc disease with vacuum disc phenomenon at L3-4, L4-5 and L5-S1. Diffuse facet degenerative changes throughout the lumbar spine. Moderate multifactorial spinal stenosis at L4-5. No evidence of osseous metastatic disease. IMPRESSION: CT Chest: 1. Multiple very small peripheral nodules involving the BILATERAL UPPER lobes, none larger than 2-3 mm, a nonspecific finding. No dominant pulmonary nodules. 2. Enlarging SUPERIOR RIGHT paratracheal (station 2R) lymph node since November, 2018. Multiple normal sized lymph nodes are present elsewhere throughout the mediastinum which are stable. 3.  No acute cardiopulmonary disease. 4.  Emphysema. (ICD10-J43.9) 5. Postsurgical pleural and parenchymal scarring related to the prior RIGHT LOWER lobectomy. Stable chronic RIGHT pleural effusion. 6.  Aortic Atherosclerosis (ICD10-170.0) CT Abdomen and Pelvis: 1. Mass involving the mid sigmoid colon which corresponds to the patient's known colon cancer. 2. No evidence of metastatic disease in the abdomen or pelvis. 3. 3.8 cm saccular infrarenal abdominal aortic aneurysm. (ICD10-I71.9). Recommend followup by ultrasound in 2 years. This recommendation follows ACR consensus guidelines: White Paper of the ACR Incidental Findings Committee II on Vascular Findings. J Am Coll Radiol 2013; 10:789-794. 4. Cholelithiasis without evidence of acute cholecystitis. 5. Hepatic steatosis. 6. Diverticulum arising medially from the descending duodenum. 7. Distal descending and proximal sigmoid colon diverticulosis without evidence of acute diverticulitis. 8. Moderate median lobe prostate gland enlargement. 9.  Scarring involving the LOWER  pole the LEFT kidney. 10. Small BILATERAL inguinal hernias containing fat. Electronically Signed   By: Evangeline Dakin M.D.   On: 06/25/2018 19:33   Ct Abdomen Pelvis W Contrast  Result Date: 06/25/2018 CLINICAL DATA:  New diagnosis of colon cancer. Staging. Prior RIGHT LOWER lobectomy in April, 2012 for a squamous cell carcinoma (performed at [facility]). EXAM: CT CHEST, ABDOMEN, AND PELVIS WITH CONTRAST TECHNIQUE: Multidetector CT imaging of the chest, abdomen and pelvis was performed following the standard protocol during bolus administration of intravenous contrast. CONTRAST:  133mL OMNIPAQUE IOHEXOL 300 MG/ML IV. Oral contrast was also administered. COMPARISON:  High-resolution CT chest 08/13/2017. CT abdomen and pelvis 10/26/2015. FINDINGS: CT CHEST FINDINGS Cardiovascular: Cardiac silhouette upper normal in size. Moderate three-vessel coronary atherosclerosis. No pericardial effusion. Severe atherosclerosis involving the thoracic aorta without evidence of aneurysm. Proximal great vessels atherosclerotic though widely patent. Mediastinum/Nodes: RIGHT SUPERIOR paratracheal mediastinal lymph node (station 2R) measuring approximately 2.2 x 1.4 cm, increased in size since the November, 2018 CT. Multiple normal-sized lymph nodes elsewhere in the mediastinum which appear stable. Normal appearing esophagus. Normal-appearing thyroid gland. Lungs/Pleura: Prior RIGHT LOWER lobectomy. Pleural scarring/thickening laterally and posteriorly at the RIGHT base with a chronic RIGHT pleural effusion, unchanged. Parenchymal scarring in the RIGHT MIDDLE LOBE, unchanged. Very small peripheral nodules are present in the BILATERAL UPPER lobes, none of which are more than 2-3 mm in size. No dominant nodules in either lung. No confluent airspace consolidation. No LEFT pleural effusion. Musculoskeletal: Degenerative changes involving the thoracic spine. No acute findings. No  evidence of osseous metastatic disease. CT ABDOMEN PELVIS FINDINGS Hepatobiliary: Diffuse hepatic steatosis without focal hepatic parenchymal abnormality. Solitary calcified gallstone in the gallbladder measuring approximately 8 mm. No evidence of acute cholecystitis. No biliary ductal dilation. Pancreas: Mild pancreatic atrophy. No pancreatic mass or peripancreatic edema/inflammation. Spleen: Normal in appearance for the early arterial phase of enhancement. Adrenals/Urinary Tract: Normal appearing adrenal glands. Benign 1.8 cm cyst arising from the UPPER pole of the RIGHT kidney. Scarring with severe cortical thinning involving the LOWER pole the LEFT kidney. No solid masses involving either kidney. No hydronephrosis. No urinary tract calculi. Normal appearing decompressed urinary bladder. Stomach/Bowel: Normal appearing stomach filled with oral contrast and food. Diverticulum arising medially from the descending duodenum as noted previously, with oral contrast material dependently in the diverticulum. Remainder of the small bowel normal in appearance. Mass involving the relatively decompressed sigmoid colon corresponding to the patient's known cancer. Distal descending and proximal sigmoid colon diverticulosis without evidence of acute diverticulitis. Normal-appearing long appendix in the RIGHT mid and UPPER pelvis. Vascular/Lymphatic: Severe aortoiliofemoral atherosclerosis. Saccular infrarenal abdominal aortic aneurysm with maximum diameter 3.8 cm. No pathologic lymphadenopathy. Reproductive: Moderate prostate gland enlargement, particularly the median lobe, indenting the base of the urinary bladder. Normal-appearing seminal vesicles. Other: Small BILATERAL inguinal hernias containing fat. Musculoskeletal: Degenerative disc disease with vacuum disc phenomenon at L3-4, L4-5 and L5-S1. Diffuse facet degenerative changes throughout the lumbar spine. Moderate multifactorial spinal stenosis at L4-5. No evidence of  osseous metastatic disease. IMPRESSION: CT Chest: 1. Multiple very small peripheral nodules involving the BILATERAL UPPER lobes, none larger than 2-3 mm, a nonspecific finding. No dominant pulmonary nodules. 2. Enlarging SUPERIOR RIGHT paratracheal (station 2R) lymph node since November, 2018. Multiple normal sized lymph nodes are present elsewhere throughout the mediastinum which are stable. 3.  No acute cardiopulmonary disease. 4.  Emphysema. (ICD10-J43.9) 5. Postsurgical pleural and parenchymal scarring related to the prior RIGHT LOWER lobectomy. Stable chronic RIGHT  pleural effusion. 6.  Aortic Atherosclerosis (ICD10-170.0) CT Abdomen and Pelvis: 1. Mass involving the mid sigmoid colon which corresponds to the patient's known colon cancer. 2. No evidence of metastatic disease in the abdomen or pelvis. 3. 3.8 cm saccular infrarenal abdominal aortic aneurysm. (ICD10-I71.9). Recommend followup by ultrasound in 2 years. This recommendation follows ACR consensus guidelines: White Paper of the ACR Incidental Findings Committee II on Vascular Findings. J Am Coll Radiol 2013; 10:789-794. 4. Cholelithiasis without evidence of acute cholecystitis. 5. Hepatic steatosis. 6. Diverticulum arising medially from the descending duodenum. 7. Distal descending and proximal sigmoid colon diverticulosis without evidence of acute diverticulitis. 8. Moderate median lobe prostate gland enlargement. 9. Scarring involving the LOWER pole the LEFT kidney. 10. Small BILATERAL inguinal hernias containing fat. Electronically Signed   By: Evangeline Dakin M.D.   On: 06/25/2018 19:33   Principal Problem:   Acute GI bleeding Active Problems:   AAA (abdominal aortic aneurysm) (HCC)   Chronic renal disease, stage 3, moderately decreased glomerular filtration rate between 30-59 mL/min/1.73 square meter (HCC)   PAF (paroxysmal atrial fibrillation) (HCC)   OSA (obstructive sleep apnea)   Benign essential HTN   CAD (coronary artery  disease)   Chronic diastolic CHF (congestive heart failure) ([place])   Acute blood loss anemia     LOS: 0 days   Tye Savoy ,NP 06/26/2018, 9:21 AM

## 2018-06-26 NOTE — Progress Notes (Addendum)
[facility] Progress Note   Chief Complaint:   Colon mass / anemia  SUBJECTIVE:   Feels fine, ate well following colonoscopy yesterday.   ASSESSMENT AND PLAN:   1. 77 yo male with IDA / several polyps and sigmoid colon mass on colonoscopy yesterday. Baseline hgb 12-13, currently stable in at mid 9 to 10 range  -awaiting path.  -staging CT scan --no evidence for mets in abd / pelvis . Multiple, very small peripheral nodules thorughout both upper lung lobes, nonspecific. Enlarging superior right paratracheal lymph node -regarding Eliquis, would ask that it be held for two additional days given polypectomy. Will defer to Cardiology regarding longterm need for Eliquis.   ADDENDUM: biopsies back. Mild inflammation on esophagus, no Barrett's . Polyps were tubular adenomas without HGD. Sigmoid mass c/w adenocarcinoma. He is interested in treatment in [place]  2. Hc of CVA / PAF / pacemaker, on Eliquis at home / COPD  3. GERD / endoscopically appeared to have Barrett's -await biopsies -continue PPI  4. Cholelithiasis  5. AAA, 3.8 on CT scan   OBJECTIVE:     Vital signs in last 24 hours: Temp:  [97.4 F (36.3 C)-99.6 F (37.6 C)] 98 F (36.7 C) (09/26 0900) Pulse Rate:  [45-66] 61 (09/26 0900) Resp:  [14-20] 20 (09/26 0900) BP: (116-188)/(47-111) 150/69 (09/26 0900) SpO2:  [96 %-100 %] 96 % (09/26 0626) Weight:  [91.6 kg] 91.6 kg (09/26 0626) Last BM Date: 06/25/18 General:   Alert, well-developed, male in NAD EENT:  Normal hearing, non icteric sclera, conjunctive pink.  Heart:  Regular rate and rhythm; no lower extremity edema Pulm: Normal respiratory effort, Abdomen:  Soft, nondistended, nontender.  Normal bowel sounds, no masses felt.     Neurologic:  Alert and  oriented x4;  grossly normal neurologically. Psych:  Pleasant, cooperative.  Normal mood and affect.   Intake/Output from previous day: 09/25 0701 - 09/26 0700 In: 500 [I.V.:500] Out: -    Intake/Output this shift: No intake/output data recorded.  Lab Results: Recent Labs    06/24/18 0607 06/24/18 1520 06/25/18 0550 06/26/18 0550  WBC 5.0  --  4.8 6.9  HGB 9.6* 10.4* 10.1* 9.5*  HCT 30.9*  30.6* 32.9* 32.5* 29.8*  PLT 235  --  244 228   BMET Recent Labs    06/23/18 1449 06/23/18 1502 06/24/18 0107 06/26/18 0550  NA 141 141 141 140  K 4.3 4.2 4.0 3.8  CL 108 107 107 111  CO2 26  --  24 23  GLUCOSE 124* 119* 121* 146*  BUN 14 16 14 14   CREATININE 1.36* 1.50* 1.28* 1.25*  CALCIUM 9.3  --  8.9 8.4*   LFT Recent Labs    06/23/18 1449  PROT 6.7  ALBUMIN 3.2*  AST 19  ALT 12  ALKPHOS 61  BILITOT 0.4   PT/INR Recent Labs    06/23/18 1449  LABPROT 15.3*  INR 1.22   Ct Chest W Contrast  Result Date: 06/25/2018 CLINICAL DATA:  New diagnosis of colon cancer. Staging. Prior RIGHT LOWER lobectomy in April, 2012 for a squamous cell carcinoma (performed at [facility]). EXAM: CT CHEST, ABDOMEN, AND PELVIS WITH CONTRAST TECHNIQUE: Multidetector CT imaging of the chest, abdomen and pelvis was performed following the standard protocol during bolus administration of intravenous contrast. CONTRAST:  123mL OMNIPAQUE IOHEXOL 300 MG/ML IV. Oral contrast was also administered. COMPARISON:  High-resolution CT chest 08/13/2017. CT abdomen and pelvis 10/26/2015. FINDINGS: CT CHEST FINDINGS  Cardiovascular: Cardiac silhouette upper normal in size. Moderate three-vessel coronary atherosclerosis. No pericardial effusion. Severe atherosclerosis involving the thoracic aorta without evidence of aneurysm. Proximal great vessels atherosclerotic though widely patent. Mediastinum/Nodes: RIGHT SUPERIOR paratracheal mediastinal lymph node (station 2R) measuring approximately 2.2 x 1.4 cm, increased in size since the November, 2018 CT. Multiple normal-sized lymph nodes elsewhere in the mediastinum which appear stable. Normal appearing esophagus. Normal-appearing thyroid  gland. Lungs/Pleura: Prior RIGHT LOWER lobectomy. Pleural scarring/thickening laterally and posteriorly at the RIGHT base with a chronic RIGHT pleural effusion, unchanged. Parenchymal scarring in the RIGHT MIDDLE LOBE, unchanged. Very small peripheral nodules are present in the BILATERAL UPPER lobes, none of which are more than 2-3 mm in size. No dominant nodules in either lung. No confluent airspace consolidation. No LEFT pleural effusion. Musculoskeletal: Degenerative changes involving the thoracic spine. No acute findings. No evidence of osseous metastatic disease. CT ABDOMEN PELVIS FINDINGS Hepatobiliary: Diffuse hepatic steatosis without focal hepatic parenchymal abnormality. Solitary calcified gallstone in the gallbladder measuring approximately 8 mm. No evidence of acute cholecystitis. No biliary ductal dilation. Pancreas: Mild pancreatic atrophy. No pancreatic mass or peripancreatic edema/inflammation. Spleen: Normal in appearance for the early arterial phase of enhancement. Adrenals/Urinary Tract: Normal appearing adrenal glands. Benign 1.8 cm cyst arising from the UPPER pole of the RIGHT kidney. Scarring with severe cortical thinning involving the LOWER pole the LEFT kidney. No solid masses involving either kidney. No hydronephrosis. No urinary tract calculi. Normal appearing decompressed urinary bladder. Stomach/Bowel: Normal appearing stomach filled with oral contrast and food. Diverticulum arising medially from the descending duodenum as noted previously, with oral contrast material dependently in the diverticulum. Remainder of the small bowel normal in appearance. Mass involving the relatively decompressed sigmoid colon corresponding to the patient's known cancer. Distal descending and proximal sigmoid colon diverticulosis without evidence of acute diverticulitis. Normal-appearing long appendix in the RIGHT mid and UPPER pelvis. Vascular/Lymphatic: Severe aortoiliofemoral atherosclerosis. Saccular  infrarenal abdominal aortic aneurysm with maximum diameter 3.8 cm. No pathologic lymphadenopathy. Reproductive: Moderate prostate gland enlargement, particularly the median lobe, indenting the base of the urinary bladder. Normal-appearing seminal vesicles. Other: Small BILATERAL inguinal hernias containing fat. Musculoskeletal: Degenerative disc disease with vacuum disc phenomenon at L3-4, L4-5 and L5-S1. Diffuse facet degenerative changes throughout the lumbar spine. Moderate multifactorial spinal stenosis at L4-5. No evidence of osseous metastatic disease. IMPRESSION: CT Chest: 1. Multiple very small peripheral nodules involving the BILATERAL UPPER lobes, none larger than 2-3 mm, a nonspecific finding. No dominant pulmonary nodules. 2. Enlarging SUPERIOR RIGHT paratracheal (station 2R) lymph node since November, 2018. Multiple normal sized lymph nodes are present elsewhere throughout the mediastinum which are stable. 3.  No acute cardiopulmonary disease. 4.  Emphysema. (ICD10-J43.9) 5. Postsurgical pleural and parenchymal scarring related to the prior RIGHT LOWER lobectomy. Stable chronic RIGHT pleural effusion. 6.  Aortic Atherosclerosis (ICD10-170.0) CT Abdomen and Pelvis: 1. Mass involving the mid sigmoid colon which corresponds to the patient's known colon cancer. 2. No evidence of metastatic disease in the abdomen or pelvis. 3. 3.8 cm saccular infrarenal abdominal aortic aneurysm. (ICD10-I71.9). Recommend followup by ultrasound in 2 years. This recommendation follows ACR consensus guidelines: White Paper of the ACR Incidental Findings Committee II on Vascular Findings. J Am Coll Radiol 2013; 10:789-794. 4. Cholelithiasis without evidence of acute cholecystitis. 5. Hepatic steatosis. 6. Diverticulum arising medially from the descending duodenum. 7. Distal descending and proximal sigmoid colon diverticulosis without evidence of acute diverticulitis. 8. Moderate median lobe prostate gland enlargement. 9.  Scarring involving the LOWER  pole the LEFT kidney. 10. Small BILATERAL inguinal hernias containing fat. Electronically Signed   By: Evangeline Dakin M.D.   On: 06/25/2018 19:33   Ct Abdomen Pelvis W Contrast  Result Date: 06/25/2018 CLINICAL DATA:  New diagnosis of colon cancer. Staging. 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 EXAM: CT CHEST, ABDOMEN, AND PELVIS WITH CONTRAST TECHNIQUE: Multidetector CT imaging of the chest, abdomen and pelvis was performed following the standard protocol during bolus administration of intravenous contrast. CONTRAST:  120mL OMNIPAQUE IOHEXOL 300 MG/ML IV. Oral contrast was also administered. COMPARISON:  High-resolution CT chest 08/13/2017. CT abdomen and pelvis 10/26/2015. FINDINGS: CT CHEST FINDINGS Cardiovascular: Cardiac silhouette upper normal in size. Moderate three-vessel coronary atherosclerosis. No pericardial effusion. Severe atherosclerosis involving the thoracic aorta without evidence of aneurysm. Proximal great vessels atherosclerotic though widely patent. Mediastinum/Nodes: RIGHT SUPERIOR paratracheal mediastinal lymph node (station 2R) measuring approximately 2.2 x 1.4 cm, increased in size since the November, 2018 CT. Multiple normal-sized lymph nodes elsewhere in the mediastinum which appear stable. Normal appearing esophagus. Normal-appearing thyroid gland. Lungs/Pleura: Prior RIGHT LOWER lobectomy. Pleural scarring/thickening laterally and posteriorly at the RIGHT base with a chronic RIGHT pleural effusion, unchanged. Parenchymal scarring in the RIGHT MIDDLE LOBE, unchanged. Very small peripheral nodules are present in the BILATERAL UPPER lobes, none of which are more than 2-3 mm in size. No dominant nodules in either lung. No confluent airspace consolidation. No LEFT pleural effusion. Musculoskeletal: Degenerative changes involving the thoracic spine. No acute findings. No  evidence of osseous metastatic disease. CT ABDOMEN PELVIS FINDINGS Hepatobiliary: Diffuse hepatic steatosis without focal hepatic parenchymal abnormality. Solitary calcified gallstone in the gallbladder measuring approximately 8 mm. No evidence of acute cholecystitis. No biliary ductal dilation. Pancreas: Mild pancreatic atrophy. No pancreatic mass or peripancreatic edema/inflammation. Spleen: Normal in appearance for the early arterial phase of enhancement. Adrenals/Urinary Tract: Normal appearing adrenal glands. Benign 1.8 cm cyst arising from the UPPER pole of the RIGHT kidney. Scarring with severe cortical thinning involving the LOWER pole the LEFT kidney. No solid masses involving either kidney. No hydronephrosis. No urinary tract calculi. Normal appearing decompressed urinary bladder. Stomach/Bowel: Normal appearing stomach filled with oral contrast and food. Diverticulum arising medially from the descending duodenum as noted previously, with oral contrast material dependently in the diverticulum. Remainder of the small bowel normal in appearance. Mass involving the relatively decompressed sigmoid colon corresponding to the patient's known cancer. Distal descending and proximal sigmoid colon diverticulosis without evidence of acute diverticulitis. Normal-appearing long appendix in the RIGHT mid and UPPER pelvis. Vascular/Lymphatic: Severe aortoiliofemoral atherosclerosis. Saccular infrarenal abdominal aortic aneurysm with maximum diameter 3.8 cm. No pathologic lymphadenopathy. Reproductive: Moderate prostate gland enlargement, particularly the median lobe, indenting the base of the urinary bladder. Normal-appearing seminal vesicles. Other: Small BILATERAL inguinal hernias containing fat. Musculoskeletal: Degenerative disc disease with vacuum disc phenomenon at L3-4, L4-5 and L5-S1. Diffuse facet degenerative changes throughout the lumbar spine. Moderate multifactorial spinal stenosis at L4-5. No evidence of  osseous metastatic disease. IMPRESSION: CT Chest: 1. Multiple very small peripheral nodules involving the BILATERAL UPPER lobes, none larger than 2-3 mm, a nonspecific finding. No dominant pulmonary nodules. 2. Enlarging SUPERIOR RIGHT paratracheal (station 2R) lymph node since November, 2018. Multiple normal sized lymph nodes are present elsewhere throughout the mediastinum which are stable. 3.  No acute cardiopulmonary disease. 4.  Emphysema. (ICD10-J43.9) 5. Postsurgical pleural and parenchymal scarring related to the prior RIGHT LOWER lobectomy. Stable chronic RIGHT  pleural effusion. 6.  Aortic Atherosclerosis (ICD10-170.0) CT Abdomen and Pelvis: 1. Mass involving the mid sigmoid colon which corresponds to the patient's known colon cancer. 2. No evidence of metastatic disease in the abdomen or pelvis. 3. 3.8 cm saccular infrarenal abdominal aortic aneurysm. (ICD10-I71.9). Recommend followup by ultrasound in 2 years. This recommendation follows ACR consensus guidelines: White Paper of the ACR Incidental Findings Committee II on Vascular Findings. J Am Coll Radiol 2013; 10:789-794. 4. Cholelithiasis without evidence of acute cholecystitis. 5. Hepatic steatosis. 6. Diverticulum arising medially from the descending duodenum. 7. Distal descending and proximal sigmoid colon diverticulosis without evidence of acute diverticulitis. 8. Moderate median lobe prostate gland enlargement. 9. Scarring involving the LOWER pole the LEFT kidney. 10. Small BILATERAL inguinal hernias containing fat. Electronically Signed   By: Evangeline Dakin M.D.   On: 06/25/2018 19:33   Principal Problem:   Acute GI bleeding Active Problems:   AAA (abdominal aortic aneurysm) (HCC)   Chronic renal disease, stage 3, moderately decreased glomerular filtration rate between 30-59 mL/min/1.73 square meter (HCC)   PAF (paroxysmal atrial fibrillation) (HCC)   OSA (obstructive sleep apnea)   Benign essential HTN   CAD (coronary artery  disease)   Chronic diastolic CHF (congestive heart failure) ([place])   Acute blood loss anemia     LOS: 0 days   Tye Savoy ,NP 06/26/2018, 9:21 AM

## 2018-06-26 NOTE — Discharge Summary (Signed)
Physician Discharge Summary  Troy Novosel [name] ZJQ:[id number] DOB: Jun 22, 1941 DOA: 06/23/2018  PCP: Binnie Rail, MD  Admit date: 06/23/2018 Discharge date: 06/26/2018  Admitted From: home Discharge disposition: *home   Recommendations for Outpatient Follow-Up:   1. Referral made to oncology and surgery (oncology to call patient and set up follow up) 2. -CEA pending   Discharge Diagnosis:   Principal Problem:   Acute GI bleeding Active Problems:   AAA (abdominal aortic aneurysm) (HCC)   Chronic renal disease, stage 3, moderately decreased glomerular filtration rate between 30-59 mL/min/1.73 square meter (HCC)   PAF (paroxysmal atrial fibrillation) (HCC)   OSA (obstructive sleep apnea)   Benign essential HTN   CAD (coronary artery disease)   Chronic diastolic CHF (congestive heart failure) (HCC)   Acute blood loss anemia    Discharge Condition: Improved.  Diet recommendation: Low sodium, heart healthy.  Wound care: None.  Code status: Full.   History of Present Illness:    Troy Fayette [name] is a 77 y.o. male with history of paroxysmal atrial fibrillation, hypertension, TIA.  History of carotid endarterectomy, abdominal aortic aneurysm who was brought to the ER after patient was complaining of increasing weakness.  Patient states yesterday at home while walking he felt dizzy and fell onto the bushes.  He felt weak generalized but did not lose function of the hands or leg.  He was able to regain strength and walked up to his house.  Did not have any chest pain shortness of breath nausea vomiting abdominal pain.  Has been noticing dark stools last few days.  Denies taking any NSAIDs.  Since patient was not still feeling well patient presented to the ER.  ED Course: In the ER patient hemoglobin was found to be around and stool for Hemoccult was positive.  On-call gastroenterologist fellow bowel was consulted by ER physician.  They will be seeing patient in the morning.   EKG shows A. fib with rate around 49 bpm.  Patient has been admitted for GI bleed and generalized weakness.  MRI of the brain was negative for any acute changes.  On exam patient is nonfocal.   Hospital Course by Problem:   Iron def anemia due to adenocarcinoma -to follow up with general surgery as well as oncology -CT chest/abd/pelvis done -eliquis on hold-- will route to [name] his cardiologist (will need to be held minimally until 9/29) but mass has oozing -monitor CBC -CEA pending  GERD / endoscopically appeared to have Barrett's -await biopsies -continue PPI  AAA, 3.8 on CT scan -is [facility] at [facility]  History of paroxysmal atrial fibrillation  -will need discussions with cardiology regarding long term use of anticoagulation- see above  Sleep apnea on CPAP.  Chronic kidney disease stage III-creatinine at baseline.  Hyperlipidemia on statins.  History of COPD -continue inhalers.   Medical Consultants:   GI   Discharge Exam:   Vitals:   06/26/18 0626 06/26/18 0900  BP: (!) 148/65 (!) 150/69  Pulse: 60 61  Resp: 17 20  Temp: 99 F (37.2 C) 98 F (36.7 C)  SpO2: 96%    Vitals:   06/25/18 1941 06/26/18 0131 06/26/18 0626 06/26/18 0900  BP:  (!) 116/47 (!) 148/65 (!) 150/69  Pulse:  63 60 61  Resp:  17 17 20   Temp:  99.6 F (37.6 C) 99 F (37.2 C) 98 F (36.7 C)  TempSrc:  Oral Oral Oral  SpO2: 100% 96% 96%   Weight:  91.6 kg   Height:        General exam: Appears calm and comfortable. Mild tremor-- anxious to go home  The results of significant diagnostics from this hospitalization (including imaging, microbiology, ancillary and laboratory) are listed below for reference.     Procedures and Diagnostic Studies:   Ct Head Wo Contrast  Result Date: 06/23/2018 CLINICAL DATA:  77 year old male with a history of dizziness EXAM: CT HEAD WITHOUT CONTRAST TECHNIQUE: Contiguous axial images were obtained from the base of the skull through the  vertex without intravenous contrast. COMPARISON:  01/29/2018, 06/22/2015, MR 07/29/2015 FINDINGS: Brain: No acute intracranial hemorrhage. No midline shift or mass effect. Gray-white differentiation maintained. Unremarkable appearance of the ventricular system. Vascular: Calcifications of the intracranial vasculature. Skull: No acute fracture.  No aggressive bone lesion identified. Sinuses/Orbits: Bilateral globe/scleral surgery. Mastoid air cells clear. No middle ear effusion. No significant sinus disease. Other: None IMPRESSION: Negative for acute intracranial abnormality. Electronically Signed   By: Corrie Mckusick D.O.   On: 06/23/2018 15:43   Mr Brain Wo Contrast  Result Date: 06/23/2018 CLINICAL DATA:  Focal neuro deficit, > 6 hrs, stroke suspected EXAM: MRI HEAD WITHOUT CONTRAST TECHNIQUE: Multiplanar, multiecho pulse sequences of the brain and surrounding structures were obtained without intravenous contrast. COMPARISON:  06/23/2018 head CT Brain MRI 07/29/2015 FINDINGS: BRAIN: There is no acute infarct, acute hemorrhage or mass effect. The midline structures are normal. There are no old infarcts. Multifocal white matter hyperintensity, most commonly due to chronic ischemic microangiopathy. The CSF spaces are normal for age, with no hydrocephalus. Susceptibility-sensitive sequences show no chronic microhemorrhage or superficial siderosis. VASCULAR: Major intracranial arterial and venous sinus flow voids are preserved. SKULL AND UPPER CERVICAL SPINE: The visualized skull base, calvarium, upper cervical spine and extracranial soft tissues are normal. SINUSES/ORBITS: No fluid levels or advanced mucosal thickening. No mastoid or middle ear effusion. The orbits are normal. IMPRESSION: Chronic ischemic microangiopathy without acute intracranial abnormality. Electronically Signed   By: Ulyses Jarred M.D.   On: 06/23/2018 21:01     Labs:   Basic Metabolic Panel: Recent Labs  Lab 06/23/18 1449  06/23/18 1502 06/24/18 0107 06/26/18 0550  NA 141 141 141 140  K 4.3 4.2 4.0 3.8  CL 108 107 107 111  CO2 26  --  24 23  GLUCOSE 124* 119* 121* 146*  BUN 14 16 14 14   CREATININE 1.36* 1.50* 1.28* 1.25*  CALCIUM 9.3  --  8.9 8.4*   GFR Estimated Creatinine Clearance: 55.9 mL/min (A) (by C-G formula based on SCr of 1.25 mg/dL (H)). Liver Function Tests: Recent Labs  Lab 06/23/18 1449  AST 19  ALT 12  ALKPHOS 61  BILITOT 0.4  PROT 6.7  ALBUMIN 3.2*   No results for input(s): LIPASE, AMYLASE in the last 168 hours. No results for input(s): AMMONIA in the last 168 hours. Coagulation profile Recent Labs  Lab 06/23/18 1449  INR 1.22    CBC: Recent Labs  Lab 06/23/18 1449  06/24/18 0107 06/24/18 0607 06/24/18 1520 06/25/18 0550 06/26/18 0550  WBC 5.4  --  6.0 5.0  --  4.8 6.9  NEUTROABS 3.8  --   --   --   --   --   --   HGB 10.7*   < > 10.4* 9.6* 10.4* 10.1* 9.5*  HCT 35.2*   < > 33.6* 30.9*  30.6* 32.9* 32.5* 29.8*  MCV 90.7  --  88.4 88.5  --  88.1 87.9  PLT  265  --  266 235  --  244 228   < > = values in this interval not displayed.   Cardiac Enzymes: Recent Labs  Lab 06/24/18 0750  TROPONINI <0.03   BNP: Invalid input(s): POCBNP CBG: No results for input(s): GLUCAP in the last 168 hours. D-Dimer No results for input(s): DDIMER in the last 72 hours. Hgb A1c No results for input(s): HGBA1C in the last 72 hours. Lipid Profile No results for input(s): CHOL, HDL, LDLCALC, TRIG, CHOLHDL, LDLDIRECT in the last 72 hours. Thyroid function studies Recent Labs    06/24/18 0750  TSH 0.751   Anemia work up Recent Labs    06/24/18 0607  VITAMINB12 454  FERRITIN 9*  TIBC 378  IRON 26*  RETICCTPCT 1.5   Microbiology No results found for this or any previous visit (from the past 240 hour(s)).   Discharge Instructions:   Discharge Instructions    Diet - low sodium heart healthy   Complete by:  As directed    Discharge instructions   Complete by:   As directed    Have made a referral to [facility]-- please call if have not heard in 24-48 hours Schedule appointment with surgeon from central [place] [facility]   Increase activity slowly   Complete by:  As directed      Allergies as of 06/26/2018      Reactions   Niacin-lovastatin Er Shortness Of Breath, Other (See Comments)   Dyspnea, flushing   Penicillins Hives, Shortness Of Breath, Other (See Comments)   Flushing  & dyspnea Because of a history of documented adverse serious drug reaction;Medi Alert bracelet  is recommended PCN reaction causing immediate rash, facial/tongue/throat swelling, SOB or lightheadedness with hypotension: Yes PCN reaction causing severe rash involving mucus membranes or skin necrosis: No PCN reaction occurring within the last 10 years: NO PCN reaction that required hospitalization: NO   Sulfonamide Derivatives Hives, Shortness Of Breath, Other (See Comments)   Flushing & dyspnea Because of a history of documented adverse serious drug reaction;Medi Alert bracelet  is recommended   Atorvastatin Other (See Comments)   Myalgias & athralgias- takes Crestor      Medication List    STOP taking these medications   budesonide-formoterol 160-4.5 MCG/ACT inhaler Commonly known as:  SYMBICORT   CARDURA 4 MG tablet Generic drug:  doxazosin   ELIQUIS 5 MG Tabs tablet Generic drug:  apixaban     TAKE these medications   acetaminophen 325 MG tablet Commonly known as:  TYLENOL Take 650 mg every 6 (six) hours as needed by mouth for moderate pain or headache.   amiodarone 200 MG tablet Commonly known as:  PACERONE Take 0.5 tablets (100 mg total) by mouth daily.   bimatoprost 0.01 % Soln Commonly known as:  LUMIGAN Place 1 drop at bedtime into both eyes.   Biotin 10000 MCG Tabs Take 10,000 mcg daily by mouth.   CENTRUM SILVER 50+MEN Tabs Take 1 tablet by mouth at bedtime.   dorzolamide-timolol 22.3-6.8 MG/ML ophthalmic solution Commonly  known as:  COSOPT Place 2 drops into both eyes 2 (two) times daily.   FLUoxetine 20 MG capsule Commonly known as:  PROZAC TAKE (1) CAPSULE DAILY. What changed:  See the new instructions.   furosemide 20 MG tablet Commonly known as:  LASIX Take 1 tablet (20 mg total) by mouth daily.   Magnesium 500 MG Caps Take 500 mg by mouth at bedtime.   Melatonin 5 MG Tabs Take 5  mg by mouth at bedtime.   montelukast 10 MG tablet Commonly known as:  SINGULAIR Take 1 tablet (10 mg total) by mouth at bedtime.   POLY-IRON 150 150 MG capsule Generic drug:  iron polysaccharides Take 150 mg by mouth at bedtime.   potassium chloride SA 20 MEQ tablet Commonly known as:  K-DUR,KLOR-CON TAKE 1 TABLET ONCE DAILY.   ranitidine 150 MG tablet Commonly known as:  ZANTAC Take 150 mg by mouth daily as needed for heartburn.   rosuvastatin 40 MG tablet Commonly known as:  CRESTOR TAKE 1 TABLET ONCE DAILY. What changed:  when to take this   SYSTANE OP Place 1-2 drops as needed into both eyes (for dry eyes).      Follow-up Information    Binnie Rail, MD Follow up in 1 week(s).   Specialty:  Internal Medicine Contact information: [place] [address] [phone]        Sur[facility], [place] Follow up.   Specialty:  General Surgery Contact information: [address] [place] [address] [phone]        Larey Dresser, MD Follow up.   Specialty:  Cardiology Why:  to discuss future anticoagulation Contact information: [address] [place]  [address] [place] Follow up.   Why:  [facility] [place] [address]  [phone]  I have left a message for a referral, please follow up in you have not heard in 24 hours           Time coordinating discharge: 35 min  Signed:  Geradine Girt  [facility] 06/26/2018, 1:33  PM

## 2018-06-27 ENCOUNTER — Telehealth: Payer: Self-pay

## 2018-06-27 ENCOUNTER — Telehealth: Payer: Self-pay | Admitting: Nurse Practitioner

## 2018-06-27 ENCOUNTER — Telehealth: Payer: Self-pay | Admitting: *Deleted

## 2018-06-27 DIAGNOSIS — C189 Malignant neoplasm of colon, unspecified: Secondary | ICD-10-CM

## 2018-06-27 LAB — CEA: CEA: 1.4 ng/mL (ref 0.0–4.7)

## 2018-06-27 NOTE — Telephone Encounter (Signed)
Referrals have been made to [facility] and Oncology. I requested expedited appointments.

## 2018-06-27 NOTE — Telephone Encounter (Signed)
Spoke to the Troy [name]'s wife to schedule an appt for the Troy [name] to see Lacie on 9/30 at 130pm. Mrs. Raczkowski is aware to arrive 30 minutes early.

## 2018-06-27 NOTE — Telephone Encounter (Signed)
-----   Message from Irving Copas., MD sent at 06/26/2018  6:57 PM EDT ----- Regarding: Patient for [facility] and referrals Dear Ivor Reining and Chong Sicilian, I hope you are both well.  This is a patient that we diagnosed with sigmoid colon adenocarcinoma during hospital stay in the setting of iron deficiency anemia.  We obtain a CT chest abdomen pelvis.  Abdomen pelvis looks okay.  He has a history of lung disease and has had prior squamous cell of the lung status post resection years ago with Dr. Otilio Connors at [facility].  He has a lesion/likely lymph node in the paratracheal region that is larger than on previous scans in our system.  We do not have access to [facility] system records. I discussed with the patient as did Dr. Lucianne Lei and would like a referral to our colorectal surgeons as well as our oncology here.  Can you please expedite these referrals. Please let us know if there is anything else we can do, the patient and family will be awaiting callback's.  Chester Holstein

## 2018-06-27 NOTE — Telephone Encounter (Signed)
Transition Care Management Follow-up Telephone Call   Date discharged? 06/26/18   How have you been since you were released from the hospital? Spoke w/wife she states husband is doing alright   Do you understand why you were in the hospital? YES   Do you understand the discharge instructions? YES   Where were you discharged to? Home   Items Reviewed:  Medications reviewed: YES  Allergies reviewed: YES  Dietary changes reviewed: YES  Referrals reviewed: YES, will see oncologist Monday 9/30   Functional Questionnaire:   Activities of Daily Living (ADLs):   She states he are independent in the following: bathing and hygiene, feeding, continence, grooming, toileting and dressing States they require assistance with the following: ambulation   Any transportation issues/concerns?: NO   Any patient concerns? NO   Confirmed importance and date/time of follow-up visits scheduled YES, appt 07/02/18  Provider Appointment booked with Dr. Quay Burow  Confirmed with patient if condition begins to worsen call PCP or go to the ER.  Patient was given the office number and encouraged to call back with question or concerns.  : YES

## 2018-06-30 ENCOUNTER — Ambulatory Visit: Payer: Commercial Managed Care - PPO | Admitting: Internal Medicine

## 2018-06-30 ENCOUNTER — Inpatient Hospital Stay: Payer: Commercial Managed Care - PPO | Attending: Nurse Practitioner | Admitting: Nurse Practitioner

## 2018-06-30 ENCOUNTER — Telehealth: Payer: Self-pay | Admitting: Oncology

## 2018-06-30 ENCOUNTER — Encounter: Payer: Self-pay | Admitting: Nurse Practitioner

## 2018-06-30 ENCOUNTER — Encounter: Payer: Self-pay | Admitting: Gastroenterology

## 2018-06-30 VITALS — BP 147/68 | HR 63 | Temp 98.2°F | Resp 18 | Ht 73.0 in | Wt 201.9 lb

## 2018-06-30 DIAGNOSIS — C189 Malignant neoplasm of colon, unspecified: Secondary | ICD-10-CM | POA: Insufficient documentation

## 2018-06-30 DIAGNOSIS — C187 Malignant neoplasm of sigmoid colon: Secondary | ICD-10-CM | POA: Diagnosis not present

## 2018-06-30 DIAGNOSIS — Z803 Family history of malignant neoplasm of breast: Secondary | ICD-10-CM

## 2018-06-30 DIAGNOSIS — Z87891 Personal history of nicotine dependence: Secondary | ICD-10-CM

## 2018-06-30 DIAGNOSIS — D472 Monoclonal gammopathy: Secondary | ICD-10-CM

## 2018-06-30 DIAGNOSIS — I739 Peripheral vascular disease, unspecified: Secondary | ICD-10-CM | POA: Diagnosis not present

## 2018-06-30 DIAGNOSIS — J449 Chronic obstructive pulmonary disease, unspecified: Secondary | ICD-10-CM

## 2018-06-30 DIAGNOSIS — Z85118 Personal history of other malignant neoplasm of bronchus and lung: Secondary | ICD-10-CM

## 2018-06-30 DIAGNOSIS — I509 Heart failure, unspecified: Secondary | ICD-10-CM

## 2018-06-30 DIAGNOSIS — Z7901 Long term (current) use of anticoagulants: Secondary | ICD-10-CM

## 2018-06-30 DIAGNOSIS — H409 Unspecified glaucoma: Secondary | ICD-10-CM

## 2018-06-30 DIAGNOSIS — R599 Enlarged lymph nodes, unspecified: Secondary | ICD-10-CM

## 2018-06-30 DIAGNOSIS — Z8371 Family history of colonic polyps: Secondary | ICD-10-CM

## 2018-06-30 DIAGNOSIS — I4891 Unspecified atrial fibrillation: Secondary | ICD-10-CM

## 2018-06-30 DIAGNOSIS — D500 Iron deficiency anemia secondary to blood loss (chronic): Secondary | ICD-10-CM

## 2018-06-30 LAB — CUP PACEART REMOTE DEVICE CHECK
Date Time Interrogation Session: 20190919023744
Implantable Pulse Generator Implant Date: 20161111

## 2018-06-30 NOTE — Telephone Encounter (Signed)
Appts scheduled avs/calendar printed per 9/30 los °

## 2018-06-30 NOTE — Progress Notes (Addendum)
[place]  [phone] Fax:([phone]  Clinic New Consult Note   Patient Care Team: Binnie Rail, MD as PCP - General (Internal Medicine) Thompson Grayer, MD as PCP - Electrophysiology (Cardiology) Everardo All, MD (Hematology and Oncology) Lerry Paterson, MD as Referring Physician (Thoracic Surgery) Larey Dresser, MD as Consulting Physician (Cardiology) Susa Day, MD as Consulting Physician (Orthopedic Surgery) Date of Service: 06/30/18   CHIEF COMPLAINTS/PURPOSE OF CONSULTATION:  Colon cancer; consult requested by Dr. Rush Landmark   HISTORY OF PRESENTING ILLNESS:  Troy Kenner [name] 77 y.o. male is here because of newly diagnosed colon cancer. He presented to ED on 06/23/18 with symptomatic iron deficiency anemia with weakness and near fall, work up revealed Hgb 10.7 and iron studies reflecting ferritin 9, serum iron 26, 7% saturation. He did not receive transfusion. He reported few days history of dark stools, but also reports being on oral iron for 2 years; FOBT was positive in the ED. Last colonoscopy 05/16/07 with hyperplastic polyp in the rectum. He was admitted for work up and, after anticoagulation was held, underwent upper endoscopy and colonoscopy per Dr. Rush Landmark on 06/25/18. Upper endoscopy showed no gross lesions. Colonoscopy revealed multiple polyps in the ascending and descending colon, diverticulosis in the sigmoid and descending colon and non bleeding internal hemorrhoids as well as a 4-cm frond-like/villous, fungating, polypoid and ulcerated partially obstructing medium-sized mass with oozing found in the sigmoid colon at 35-39 cm. Biopsy of the sigmoid mass was positive for adenocarcinoma. Staging CT CAP showed enlarging right superior paratracheal mediastinal node measuring 2.2 x 1.4 cm, which is increased from 08/2017 CT. No other evidence of metastasis in the abdomen or pelvis. CEA is normal, 1.4.   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 Elenor Quinones at [facility]. PMH otherwise notable for HL, PVD, COPD s/p pulmonary rehab, PAD, TIA s/p endarterectomy, AAA, Afib on anticoagulation, CHF, glaucoma, and MGUS.   Socially, he is married with 3 children, 2 daughters and a son. Daughter diagnosed with breast cancer at age 75 and also with sessile polyp on recent colonoscopy. Denies other family history of colorectal cancer, breast, GYN, or pancreas cancer. 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 He exercises twice per week. He does not drive often due to glaucoma, but otherwise is independent with ADLs. He denies drug use; drinks alcohol socially. He quit smoking cigarettes in 2015 after 50 years.   Today, he reports gradually worsening fatigue and dyspnea over last 3 weeks. He has decreased appetite without significant weight loss. He has constipation since hospital discharge, last BM was today. Stools are dark without obvious blood, he reports being on oral iron for 2 years. On eliquis for Afib but has been on hold since admission. Denies abdominal pain, n/v/d, adenopathy, recent infection, cough, chest pain, fever, or chills.   MEDICAL HISTORY:  Past Medical History:  Diagnosis Date  . AAA (abdominal aortic aneurysm) (HCC) LAST ABD. [name]  JAN 2014  3.4CM   MONITORED BY DR [name]  . Arthritis   . Atrial fibrillation ([place])   . Bilateral carotid artery stenosis DUPLEX 12-29-2012  BY DR [facility]   BILATERAL ICA STENOSIS 60-79%  . BPH (benign prostatic hypertrophy)   . Cancer ([place]) 01/25/2012   LUNG  . Complication of anesthesia    had nausea after carotid artery surgery, states the medicine given to help the  nausea, made it worse  . COPD (chronic obstructive pulmonary disease) ([place])   . GERD (gastroesophageal reflux disease)   . Glaucoma BOTH EYES   Dr Gershon Crane  .  History of basal cell carcinoma excision   . History of lung cancer APRIL 2012  SQUAMOUS CELL---- S/P RIGHT LOWER LOBECTOMY AT [facility] --  NO CHEMORADIATION---  NO RECURRENCE    ONCOLOGIST- DR Tressie Stalker  LOV IN [facility] 10-27-2012  . Hx of adenomatous colonic polyps 2005    X 2; 1 hyperplastic polyp; Dr Olevia Perches  . Hyperlipidemia   . Hypertension   . Impaired fasting glucose 2007   108; A1c5.4%  . Lesion of bladder   . Microhematuria   . OSA (obstructive sleep apnea) 08/29/2015   Mild with AHI overall 6/hr but severe during REM sleep with AHI 30/hr with oxygen saturations < 88% for 5.5 minutes of total sleep time.    [name] PAD (peripheral artery disease) ([place]) ABI'S  JAN 2014  0.65 ON RIGHT ;  1.04 ON LEFT  . Peripheral vascular disease ([place]) S/P ANGIOPLASTY AND STENTING   FOLLOWED  BY DR [name]  . Spinal stenosis Sept. 2015  . Stroke [facility]) Jul 07, 2013   mini  TIA  . Urgency of urination     SURGICAL HISTORY: Past Surgical History:  Procedure Laterality Date  . ANGIO PLASTY     X 4 in legs  . AORTOGRAM  07-27-2002   MILD DIFFUSE ILIAC ARTERY OCCLUSIVE DISEASE /  LEFT RENAL ARTERY 20%/ PATENT LEFT FEM-POP GRAFT/ MILD SFA AND POPLITEAL ARTERY OCCLUSIVE DISEASE W/ SEVERE KIDNEY OCCLUSIVE DISEASE  . BASAL CELL CARCINOMA EXCISION     MULTIPLE TIMES--  RIGHT FOREARM, CHEEKS, AND BACK  . BIOPSY  06/25/2018   Procedure: BIOPSY;  Surgeon: Rush Landmark Telford Nab., MD;  Location: [place];  Service: Gastroenterology;;  . CARDIOVASCULAR STRESS TEST  12-08-2012  DR MCLEAN   NORMAL LEXISCAN WITH NO EXERCISE NUCLEAR STUDY/ EF 66%/   NO ISCHEMIA/ NO SIGNIFICANT CHANGE FROM PRIOR STUDY  . CARDIOVERSION N/A 02/14/2018   Procedure: CARDIOVERSION;  Surgeon: Larey Dresser, MD;  Location: [facility] ENDOSCOPY;  Service: Cardiovascular;  Laterality: N/A;  . CAROTID ANGIOGRAM N/A 07/10/2013   Procedure: CAROTID ANGIOGRAM;  Surgeon: Elam Dutch, MD;  Location: [facility] CATH LAB;  Service: Cardiovascular;  Laterality:  N/A;  . CAROTID ENDARTERECTOMY Right 07-14-13   cea  . CATARACT EXTRACTION W/ INTRAOCULAR LENS  IMPLANT, BILATERAL    . colon polyectomy    . COLONOSCOPY WITH PROPOFOL N/A 06/25/2018   Procedure: COLONOSCOPY WITH PROPOFOL;  Surgeon: Rush Landmark Telford Nab., MD;  Location: [place];  Service: Gastroenterology;  Laterality: N/A;  . CYSTOSCOPY W/ RETROGRADES Bilateral 01/21/2013   Procedure: CYSTOSCOPY WITH RETROGRADE PYELOGRAM;  Surgeon: Molli Hazard, MD;  Location: [facility];  Service: Urology;  Laterality: Bilateral;   CYSTO, BLADDER BIOPSY, BILATERAL RETROGRADE PYELOGRAM  RAD TECH FROM RADIOLOGY PER JOY  . CYSTOSCOPY WITH BIOPSY N/A 01/21/2013   Procedure: CYSTOSCOPY WITH BIOPSY;  Surgeon: Molli Hazard, MD;  Location: [facility];  Service: Urology;  Laterality: N/A;  . ENDARTERECTOMY Right 07/14/2013   Procedure: ENDARTERECTOMY CAROTID;  Surgeon: Angelia Mould, MD;  Location: [place];  Service: Vascular;  Laterality: Right;  . EP IMPLANTABLE DEVICE N/A 08/12/2015   Procedure: Loop Recorder Insertion;  Surgeon: [place] Grayer, MD;  Location: [place] CV LAB;  Service: Cardiovascular;  Laterality: N/A;  . ESOPHAGOGASTRODUODENOSCOPY (EGD) WITH PROPOFOL N/A 06/25/2018  Procedure: ESOPHAGOGASTRODUODENOSCOPY (EGD) WITH PROPOFOL;  Surgeon: Rush Landmark Telford Nab., MD;  Location: [place];  Service: Gastroenterology;  Laterality: N/A;  . FEMORAL-POPLITEAL BYPASS GRAFT Left 1994  [facility]   AND 2001  IN [place]  . FEMORAL-POPLITEAL BYPASS GRAFT Left 08/30/2015   Procedure: REVISION OF BYPASS GRAFT Left  FEMORAL-POPLITEAL ARTERY;  Surgeon: Angelia Mould, MD;  Location: [place];  Service: Vascular;  Laterality: Left;  . LARYNGOSCOPY  06-27-2004   BX VOCAL CORD  (LEUKOPLAKIA)  PER PT NO ISSUES SINCE  . LEFT HEART CATH AND CORONARY ANGIOGRAPHY N/A 08/20/2017   Procedure: LEFT HEART CATH AND CORONARY ANGIOGRAPHY;  Surgeon: Larey Dresser, MD;  Location: [place] CV LAB;  Service: Cardiovascular;  Laterality: N/A;  . LOWER EXTREMITY ANGIOGRAM Bilateral 08/29/2015   Procedure: Lower Extremity Angiogram;  Surgeon: Angelia Mould, MD;  Location: [place] CV LAB;  Service: Cardiovascular;  Laterality: Bilateral;  . LUNG LOBECTOMY  01/24/2011    RIGHT UPPER LOBE  (SQUAMOUS CELL CARCINOMA) Dr Dorthea Cove , [facility]. No chemotherapyor radiation  . PATCH ANGIOPLASTY Right 07/14/2013   Procedure: PATCH ANGIOPLASTY;  Surgeon: Angelia Mould, MD;  Location: [place];  Service: Vascular;  Laterality: Right;  . PATCH ANGIOPLASTY Left 08/30/2015   Procedure: VEIN PATCH ANGIOPLASTY OF PROXIMAL Left BYPASS GRAFT;  Surgeon: Angelia Mould, MD;  Location: [place];  Service: Vascular;  Laterality: Left;  . PERIPHERAL VASCULAR CATHETERIZATION N/A 08/29/2015   Procedure: Abdominal Aortogram;  Surgeon: Angelia Mould, MD;  Location: [place] CV LAB;  Service: Cardiovascular;  Laterality: N/A;  . POLYPECTOMY  06/25/2018   Procedure: POLYPECTOMY;  Surgeon: Rush Landmark Telford Nab., MD;  Location: [place];  Service: Gastroenterology;;  . Lia Foyer INJECTION  06/25/2018   Procedure: SUBMUCOSAL INJECTION;  Surgeon: Irving Copas., MD;  Location: [place];  Service: Gastroenterology;;  . TEE WITHOUT CARDIOVERSION N/A 02/14/2018   Procedure: TRANSESOPHAGEAL ECHOCARDIOGRAM (TEE);  Surgeon: Larey Dresser, MD;  Location: [facility] ENDOSCOPY;  Service: Cardiovascular;  Laterality: N/A;  . trabecular surgery     OS  . TRANSTHORACIC ECHOCARDIOGRAM  12-29-2012  DR [facility]   MILD LVH/  LVSF NORMAL/ EF 24-09%/  GRADE I DIASTOLIC DYSFUNCTION    SOCIAL HISTORY: Social History   Socioeconomic History  . Marital status: Married    Spouse name: Troy [name]   . Number of children: 3  . Years of education: 12+  . Highest education level: Not on file  Occupational History  . 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 Financial resource strain: Not on file  . Food insecurity:    Worry: Not on file    Inability: Not on file  . Transportation needs:    Medical: Not on file    Non-medical: Not on file  Tobacco Use  . Smoking status: Former Smoker    Packs/day: 0.50    Years: 50.00    Pack years: 25.00    Types: Cigarettes    Last attempt to quit: 07/28/2014    Years since quitting: 3.9  . Smokeless tobacco: Never Used  Substance and Sexual Activity  . Alcohol use: Yes    Alcohol/week: 0.0 standard drinks    Comment:  socially, variable  . Drug use: No  . Sexual activity: Not on file  Lifestyle  . Physical activity:    Days per week:  Not on file    Minutes per session: Not on file  . Stress: Not on file  Relationships  . Social connections:    Talks on phone: Not on file    Gets together: Not on file    Attends religious service: Not on file    Active member of club or organization: Not on file    Attends meetings of clubs or organizations: Not on file    Relationship status: Not on file  . Intimate partner violence:    Fear of current or ex partner: Not on file    Emotionally abused: Not on file    Physically abused: Not on file    Forced sexual activity: Not on file  Other Topics Concern  . Not on file  Social History Narrative   Patient lives at home with spouse Troy [name]   Patient has 3 children    Patient is right handed     FAMILY HISTORY: Family History  Problem Relation Age of Onset  . Stroke Mother        mini strokes  . Alcohol abuse Father   . Heart disease Father        MI after 30  . Stroke Father   . Hypertension Father   . Heart attack Father   . Heart disease Paternal Aunt        several  . Hypertension Paternal Aunt        several  . Stroke Paternal Aunt        several  . Stroke Paternal Uncle        several  . Heart disease  Paternal Uncle        several;2 had MI pre 73  . Cancer Daughter 80       breast ca, also with benign sessile polyp     ALLERGIES:  is allergic to niacin-lovastatin er; penicillins; sulfonamide derivatives; and atorvastatin.  MEDICATIONS:  Current Outpatient Medications  Medication Sig Dispense Refill  . acetaminophen (TYLENOL) 325 MG tablet Take 650 mg every 6 (six) hours as needed by mouth for moderate pain or headache.    [name] amiodarone (PACERONE) 200 MG tablet Take 0.5 tablets (100 mg total) by mouth daily.    . bimatoprost (LUMIGAN) 0.01 % SOLN Place 1 drop at bedtime into both eyes.     . Biotin 10000 MCG TABS Take 10,000 mcg daily by mouth.     . dorzolamide-timolol (COSOPT) 22.3-6.8 MG/ML ophthalmic solution Place 2 drops into both eyes 2 (two) times daily.     [name] FLUoxetine (PROZAC) 20 MG capsule TAKE (1) CAPSULE DAILY. (Patient taking differently: Take 20 mg by mouth daily. ) 90 capsule 1  . furosemide (LASIX) 20 MG tablet Take 1 tablet (20 mg total) by mouth daily. 30 tablet 11  . iron polysaccharides (POLY-IRON 150) 150 MG capsule Take 150 mg by mouth at bedtime.     . Magnesium 500 MG CAPS Take 500 mg by mouth at bedtime.    . Melatonin 5 MG TABS Take 5 mg by mouth at bedtime.    . montelukast (SINGULAIR) 10 MG tablet Take 1 tablet (10 mg total) by mouth at bedtime. 30 tablet 5  . Multiple Vitamins-Minerals (CENTRUM SILVER 50+MEN) TABS Take 1 tablet by mouth at bedtime.    Vladimir Faster Glycol-Propyl Glycol (SYSTANE OP) Place 1-2 drops as needed into both eyes (for dry eyes).    . potassium chloride SA (K-DUR,KLOR-CON) 20 MEQ tablet TAKE 1 TABLET ONCE DAILY. (  Patient taking differently: Take 20 mEq by mouth daily. ) 30 tablet 3  . ranitidine (ZANTAC) 150 MG tablet Take 150 mg by mouth daily as needed for heartburn.     . rosuvastatin (CRESTOR) 40 MG tablet TAKE 1 TABLET ONCE DAILY. (Patient taking differently: Take 40 mg by mouth at bedtime. ) 30 tablet 2   No current  facility-administered medications for this visit.      PHYSICAL EXAMINATION: ECOG PERFORMANCE STATUS: 1 - Symptomatic but completely ambulatory  Vitals:   06/30/18 1343  BP: (!) 147/68  Pulse: 63  Resp: 18  Temp: 98.2 F (36.8 C)  SpO2: 98%   Filed Weights   06/30/18 1343  Weight: 201 lb 14.4 oz (91.6 kg)    GENERAL:alert, no distress and comfortable SKIN: no rashes or significant lesions EYES: sclera clear OROPHARYNX:no thrush or ulcers  LYMPH:  no palpable cervical, supraclavicular, axillary, or inguinal lymphadenopathy  LUNGS: clear to auscultation with normal breathing effort HEART: regular rate & rhythm, no significant lower extremity edema ABDOMEN:abdomen soft, non-tender and normal bowel sounds PSYCH: alert & oriented x 3 with fluent speech NEURO: no focal motor deficits   LABORATORY DATA:  I have reviewed the data as listed CBC Latest Ref Rng & Units 06/26/2018 06/25/2018 06/24/2018  WBC 4.0 - 10.5 K/uL 6.9 4.8 -  Hemoglobin 13.0 - 17.0 g/dL 9.5(L) 10.1(L) 10.4(L)  Hematocrit 39.0 - 52.0 % 29.8(L) 32.5(L) 32.9(L)  Platelets 150 - 400 K/uL 228 244 -   CMP Latest Ref Rng & Units 06/26/2018 06/24/2018 06/23/2018  Glucose 70 - 99 mg/dL 146(H) 121(H) 119(H)  BUN 8 - 23 mg/dL 14 14 16   Creatinine 0.61 - 1.24 mg/dL 1.25(H) 1.28(H) 1.50(H)  Sodium 135 - 145 mmol/L 140 141 141  Potassium 3.5 - 5.1 mmol/L 3.8 4.0 4.2  Chloride 98 - 111 mmol/L 111 107 107  CO2 22 - 32 mmol/L 23 24 -  Calcium 8.9 - 10.3 mg/dL 8.4(L) 8.9 -  Total Protein 6.5 - 8.1 g/dL - - -  Total Bilirubin 0.3 - 1.2 mg/dL - - -  Alkaline Phos 38 - 126 U/L - - -  AST 15 - 41 U/L - - -  ALT 0 - 44 U/L - - -    PATHOLOGY   Initial biopsy 06/25/18 Diagnosis 1. Duodenum, Biopsy - BENIGN SMALL BOWEL MUCOSA. - NO ACTIVE INFLAMMATION OR VILLOUS ATROPHY IDENTIFIED. 2. Stomach, biopsy - CHRONIC GASTRITIS WITH GOBLET CELL METAPLASIA. - THERE IS NO EVIDENCE OF HELICOBACTER PYLORI, DYSPLASIA OR  MALIGNANCY. - SEE COMMENT. 3. Esophagus, biopsy, at 45 cm - GASTROESOPHAGEAL JUNCTION MUCOSA WITH MILD INFLAMMATION CONSISTENT WITH GASTROESOPHAGEAL REFLUX. - THERE IS NO EVIDENCE OF GOBLET CELL METAPLASIA, DYSPLASIA OR MALIGNANCY IDENTIFIED. 4. Colon, polyp(s), ascending, hepatic flexure and descending - TUBULAR ADENOMA(S). - HIGH GRADE DYSPLASIA IS NOT IDENTIFIED. 5. Colon, biopsy, sigmoid at 35-39 cm mass - ADENOCARCINOMA. - SEE COMMENT.  Colonoscopy per Dr. Rush Landmark: 06/25/18 A frond-like/villous, fungating, polypoid and ulcerated partially obstructing medium-sized mass was found in the sigmoid colon. The mass was partially circumferential (involving one-third of the lumen circumference). The mass measured four cm in length. Oozing was present. IMPRESSION - Preparation of the colon was fair. - The examined portion of the ileum was normal. - Three 5 to 8 mm polyps in the ascending colon, removed with a hot snare. Resected and retrieved. - Three 3 to 6 mm polyps in the descending colon, at the hepatic flexure and in the ascending colon, removed with a  cold snare. Resected and retrieved. - Rule out malignancy, partially obstructing tumor in the sigmoid colon. Biopsied. Tattooed proximally. - Diverticulosis in the recto-sigmoid colon, in the sigmoid colon and in the descending colon. - Non-bleeding non-thrombosed internal hemorrhoids.  UPPER ENDOSCOPY 06/25/18  - No gross lesions in esophagus. - Salmon-colored mucosa suspicious for Barrett's esophagus. Biopsied to rule out Barrett's. - Z-line irregular, 46 cm from the incisors. - No gross lesions in the stomach. Biopsied for HP. - Erythematous duodenopathy in bulb. No other gross lesions in the second portion of the duodenum and in the third portion of the duodenum. Biopsied to rule out Celiac.   RADIOGRAPHIC STUDIES: I have personally reviewed the radiological images as listed and agreed with the findings in the  report.  IMPRESSION: 06/25/18  CT Chest: 1. Multiple very small peripheral nodules involving the BILATERAL UPPER lobes, none larger than 2-3 mm, a nonspecific finding. No dominant pulmonary nodules. 2. Enlarging SUPERIOR RIGHT paratracheal (station 2R) lymph node since November, 2018. Multiple normal sized lymph nodes are present elsewhere throughout the mediastinum which are stable. 3.  No acute cardiopulmonary disease. 4.  Emphysema. (ICD10-J43.9) 5. Postsurgical pleural and parenchymal scarring related to the prior RIGHT LOWER lobectomy. Stable chronic RIGHT pleural effusion. 6.  Aortic Atherosclerosis (ICD10-170.0) CT Abdomen and Pelvis: 1. Mass involving the mid sigmoid colon which corresponds to the patient's known colon cancer. 2. No evidence of metastatic disease in the abdomen or pelvis. 3. 3.8 cm saccular infrarenal abdominal aortic aneurysm. (ICD10-I71.9). Recommend followup by ultrasound in 2 years. This recommendation follows ACR consensus guidelines: White Paper of the ACR Incidental Findings Committee II on Vascular Findings. J Am Coll Radiol 2013; 10:789-794. 4. Cholelithiasis without evidence of acute cholecystitis. 5. Hepatic steatosis. 6. Diverticulum arising medially from the descending duodenum. 7. Distal descending and proximal sigmoid colon diverticulosis without evidence of acute diverticulitis. 8. Moderate median lobe prostate gland enlargement. 9. Scarring involving the LOWER pole the LEFT kidney. 10. Small BILATERAL inguinal hernias containing fat.  Ct Head Wo Contrast  Result Date: 06/23/2018 CLINICAL DATA:  77 year old male with a history of dizziness EXAM: CT HEAD WITHOUT CONTRAST TECHNIQUE: Contiguous axial images were obtained from the base of the skull through the vertex without intravenous contrast. COMPARISON:  01/29/2018, 06/22/2015, MR 07/29/2015 FINDINGS: Brain: No acute intracranial hemorrhage. No midline shift or mass effect. Gray-white  differentiation maintained. Unremarkable appearance of the ventricular system. Vascular: Calcifications of the intracranial vasculature. Skull: No acute fracture.  No aggressive bone lesion identified. Sinuses/Orbits: Bilateral globe/scleral surgery. Mastoid air cells clear. No middle ear effusion. No significant sinus disease. Other: None IMPRESSION: Negative for acute intracranial abnormality. Electronically Signed   By: Corrie Mckusick D.O.   On: 06/23/2018 15:43   Ct Chest W Contrast  Result Date: 06/25/2018 CLINICAL DATA:  New diagnosis of colon cancer. Staging. Prior RIGHT LOWER lobectomy in April, 2012 for a squamous cell carcinoma (performed at [facility]). EXAM: CT CHEST, ABDOMEN, AND PELVIS WITH CONTRAST TECHNIQUE: Multidetector CT imaging of the chest, abdomen and pelvis was performed following the standard protocol during bolus administration of intravenous contrast. CONTRAST:  158mL OMNIPAQUE IOHEXOL 300 MG/ML IV. Oral contrast was also administered. COMPARISON:  High-resolution CT chest 08/13/2017. CT abdomen and pelvis 10/26/2015. FINDINGS: CT CHEST FINDINGS Cardiovascular: Cardiac silhouette upper normal in size. Moderate three-vessel coronary atherosclerosis. No pericardial effusion. Severe atherosclerosis involving the thoracic aorta without evidence of aneurysm. Proximal great vessels atherosclerotic though widely patent. Mediastinum/Nodes: RIGHT SUPERIOR paratracheal mediastinal lymph node (  station 2R) measuring approximately 2.2 x 1.4 cm, increased in size since the November, 2018 CT. Multiple normal-sized lymph nodes elsewhere in the mediastinum which appear stable. Normal appearing esophagus. Normal-appearing thyroid gland. Lungs/Pleura: Prior RIGHT LOWER lobectomy. Pleural scarring/thickening laterally and posteriorly at the RIGHT base with a chronic RIGHT pleural effusion, unchanged. Parenchymal scarring in the RIGHT MIDDLE LOBE, unchanged. Very small peripheral nodules  are present in the BILATERAL UPPER lobes, none of which are more than 2-3 mm in size. No dominant nodules in either lung. No confluent airspace consolidation. No LEFT pleural effusion. Musculoskeletal: Degenerative changes involving the thoracic spine. No acute findings. No evidence of osseous metastatic disease. CT ABDOMEN PELVIS FINDINGS Hepatobiliary: Diffuse hepatic steatosis without focal hepatic parenchymal abnormality. Solitary calcified gallstone in the gallbladder measuring approximately 8 mm. No evidence of acute cholecystitis. No biliary ductal dilation. Pancreas: Mild pancreatic atrophy. No pancreatic mass or peripancreatic edema/inflammation. Spleen: Normal in appearance for the early arterial phase of enhancement. Adrenals/Urinary Tract: Normal appearing adrenal glands. Benign 1.8 cm cyst arising from the UPPER pole of the RIGHT kidney. Scarring with severe cortical thinning involving the LOWER pole the LEFT kidney. No solid masses involving either kidney. No hydronephrosis. No urinary tract calculi. Normal appearing decompressed urinary bladder. Stomach/Bowel: Normal appearing stomach filled with oral contrast and food. Diverticulum arising medially from the descending duodenum as noted previously, with oral contrast material dependently in the diverticulum. Remainder of the small bowel normal in appearance. Mass involving the relatively decompressed sigmoid colon corresponding to the patient's known cancer. Distal descending and proximal sigmoid colon diverticulosis without evidence of acute diverticulitis. Normal-appearing long appendix in the RIGHT mid and UPPER pelvis. Vascular/Lymphatic: Severe aortoiliofemoral atherosclerosis. Saccular infrarenal abdominal aortic aneurysm with maximum diameter 3.8 cm. No pathologic lymphadenopathy. Reproductive: Moderate prostate gland enlargement, particularly the median lobe, indenting the base of the urinary bladder. Normal-appearing seminal vesicles. Other:  Small BILATERAL inguinal hernias containing fat. Musculoskeletal: Degenerative disc disease with vacuum disc phenomenon at L3-4, L4-5 and L5-S1. Diffuse facet degenerative changes throughout the lumbar spine. Moderate multifactorial spinal stenosis at L4-5. No evidence of osseous metastatic disease. IMPRESSION: CT Chest: 1. Multiple very small peripheral nodules involving the BILATERAL UPPER lobes, none larger than 2-3 mm, a nonspecific finding. No dominant pulmonary nodules. 2. Enlarging SUPERIOR RIGHT paratracheal (station 2R) lymph node since November, 2018. Multiple normal sized lymph nodes are present elsewhere throughout the mediastinum which are stable. 3.  No acute cardiopulmonary disease. 4.  Emphysema. (ICD10-J43.9) 5. Postsurgical pleural and parenchymal scarring related to the prior RIGHT LOWER lobectomy. Stable chronic RIGHT pleural effusion. 6.  Aortic Atherosclerosis (ICD10-170.0) CT Abdomen and Pelvis: 1. Mass involving the mid sigmoid colon which corresponds to the patient's known colon cancer. 2. No evidence of metastatic disease in the abdomen or pelvis. 3. 3.8 cm saccular infrarenal abdominal aortic aneurysm. (ICD10-I71.9). Recommend followup by ultrasound in 2 years. This recommendation follows ACR consensus guidelines: White Paper of the ACR Incidental Findings Committee II on Vascular Findings. J Am Coll Radiol 2013; 10:789-794. 4. Cholelithiasis without evidence of acute cholecystitis. 5. Hepatic steatosis. 6. Diverticulum arising medially from the descending duodenum. 7. Distal descending and proximal sigmoid colon diverticulosis without evidence of acute diverticulitis. 8. Moderate median lobe prostate gland enlargement. 9. Scarring involving the LOWER pole the LEFT kidney. 10. Small BILATERAL inguinal hernias containing fat. Electronically Signed   By: Evangeline Dakin M.D.   On: 06/25/2018 19:33   Mr Brain Wo Contrast  Result Date: 06/23/2018 CLINICAL DATA:  Focal  neuro deficit, > 6  hrs, stroke suspected EXAM: MRI HEAD WITHOUT CONTRAST TECHNIQUE: Multiplanar, multiecho pulse sequences of the brain and surrounding structures were obtained without intravenous contrast. COMPARISON:  06/23/2018 head CT Brain MRI 07/29/2015 FINDINGS: BRAIN: There is no acute infarct, acute hemorrhage or mass effect. The midline structures are normal. There are no old infarcts. Multifocal white matter hyperintensity, most commonly due to chronic ischemic microangiopathy. The CSF spaces are normal for age, with no hydrocephalus. Susceptibility-sensitive sequences show no chronic microhemorrhage or superficial siderosis. VASCULAR: Major intracranial arterial and venous sinus flow voids are preserved. SKULL AND UPPER CERVICAL SPINE: The visualized skull base, calvarium, upper cervical spine and extracranial soft tissues are normal. SINUSES/ORBITS: No fluid levels or advanced mucosal thickening. No mastoid or middle ear effusion. The orbits are normal. IMPRESSION: Chronic ischemic microangiopathy without acute intracranial abnormality. Electronically Signed   By: Ulyses Jarred M.D.   On: 06/23/2018 21:01   Ct Abdomen Pelvis W Contrast  Result Date: 06/25/2018 CLINICAL DATA:  New diagnosis of colon cancer. Staging. Prior RIGHT LOWER lobectomy in April, 2012 for a squamous cell carcinoma (performed at [facility] [place] [name]). EXAM: CT CHEST, ABDOMEN, AND PELVIS WITH CONTRAST TECHNIQUE: Multidetector CT imaging of the chest, abdomen and pelvis was performed following the standard protocol during bolus administration of intravenous contrast. CONTRAST:  156mL OMNIPAQUE IOHEXOL 300 MG/ML IV. Oral contrast was also administered. COMPARISON:  High-resolution CT chest 08/13/2017. CT abdomen and pelvis 10/26/2015. FINDINGS: CT CHEST FINDINGS Cardiovascular: Cardiac silhouette upper normal in size. Moderate three-vessel coronary atherosclerosis. No pericardial effusion. Severe atherosclerosis involving the thoracic  aorta without evidence of aneurysm. Proximal great vessels atherosclerotic though widely patent. Mediastinum/Nodes: RIGHT SUPERIOR paratracheal mediastinal lymph node (station 2R) measuring approximately 2.2 x 1.4 cm, increased in size since the November, 2018 CT. Multiple normal-sized lymph nodes elsewhere in the mediastinum which appear stable. Normal appearing esophagus. Normal-appearing thyroid gland. Lungs/Pleura: Prior RIGHT LOWER lobectomy. Pleural scarring/thickening laterally and posteriorly at the RIGHT base with a chronic RIGHT pleural effusion, unchanged. Parenchymal scarring in the RIGHT MIDDLE LOBE, unchanged. Very small peripheral nodules are present in the BILATERAL UPPER lobes, none of which are more than 2-3 mm in size. No dominant nodules in either lung. No confluent airspace consolidation. No LEFT pleural effusion. Musculoskeletal: Degenerative changes involving the thoracic spine. No acute findings. No evidence of osseous metastatic disease. CT ABDOMEN PELVIS FINDINGS Hepatobiliary: Diffuse hepatic steatosis without focal hepatic parenchymal abnormality. Solitary calcified gallstone in the gallbladder measuring approximately 8 mm. No evidence of acute cholecystitis. No biliary ductal dilation. Pancreas: Mild pancreatic atrophy. No pancreatic mass or peripancreatic edema/inflammation. Spleen: Normal in appearance for the early arterial phase of enhancement. Adrenals/Urinary Tract: Normal appearing adrenal glands. Benign 1.8 cm cyst arising from the UPPER pole of the RIGHT kidney. Scarring with severe cortical thinning involving the LOWER pole the LEFT kidney. No solid masses involving either kidney. No hydronephrosis. No urinary tract calculi. Normal appearing decompressed urinary bladder. Stomach/Bowel: Normal appearing stomach filled with oral contrast and food. Diverticulum arising medially from the descending duodenum as noted previously, with oral contrast material dependently in the  diverticulum. Remainder of the small bowel normal in appearance. Mass involving the relatively decompressed sigmoid colon corresponding to the patient's known cancer. Distal descending and proximal sigmoid colon diverticulosis without evidence of acute diverticulitis. Normal-appearing long appendix in the RIGHT mid and UPPER pelvis. Vascular/Lymphatic: Severe aortoiliofemoral atherosclerosis. Saccular infrarenal abdominal aortic aneurysm with maximum diameter 3.8 cm. No pathologic lymphadenopathy. Reproductive: Moderate prostate  gland enlargement, particularly the median lobe, indenting the base of the urinary bladder. Normal-appearing seminal vesicles. Other: Small BILATERAL inguinal hernias containing fat. Musculoskeletal: Degenerative disc disease with vacuum disc phenomenon at L3-4, L4-5 and L5-S1. Diffuse facet degenerative changes throughout the lumbar spine. Moderate multifactorial spinal stenosis at L4-5. No evidence of osseous metastatic disease. IMPRESSION: CT Chest: 1. Multiple very small peripheral nodules involving the BILATERAL UPPER lobes, none larger than 2-3 mm, a nonspecific finding. No dominant pulmonary nodules. 2. Enlarging SUPERIOR RIGHT paratracheal (station 2R) lymph node since November, 2018. Multiple normal sized lymph nodes are present elsewhere throughout the mediastinum which are stable. 3.  No acute cardiopulmonary disease. 4.  Emphysema. (ICD10-J43.9) 5. Postsurgical pleural and parenchymal scarring related to the prior RIGHT LOWER lobectomy. Stable chronic RIGHT pleural effusion. 6.  Aortic Atherosclerosis (ICD10-170.0) CT Abdomen and Pelvis: 1. Mass involving the mid sigmoid colon which corresponds to the patient's known colon cancer. 2. No evidence of metastatic disease in the abdomen or pelvis. 3. 3.8 cm saccular infrarenal abdominal aortic aneurysm. (ICD10-I71.9). Recommend followup by ultrasound in 2 years. This recommendation follows ACR consensus guidelines: White Paper of the  ACR Incidental Findings Committee II on Vascular Findings. J Am Coll Radiol 2013; 10:789-794. 4. Cholelithiasis without evidence of acute cholecystitis. 5. Hepatic steatosis. 6. Diverticulum arising medially from the descending duodenum. 7. Distal descending and proximal sigmoid colon diverticulosis without evidence of acute diverticulitis. 8. Moderate median lobe prostate gland enlargement. 9. Scarring involving the LOWER pole the LEFT kidney. 10. Small BILATERAL inguinal hernias containing fat. Electronically Signed   By: Evangeline Dakin M.D.   On: 06/25/2018 19:33    ASSESSMENT & PLAN: 77 year old caucasian male presenting with symptomatic anemia found to have sigmoid colon mass and enlarging right paratracheal lymph node   1. Adenocarcinoma of sigmoid colon  -We reviewed his medical record including colonoscopy, CT, and pathology in detail with the patient and family. Dr. Benay Spice reviewed the CT images during today's visit. Mr. Mcclintock has what appears to be early stage colon cancer, without clinical evidence of metastasis in the abdomen or pelvis. MD discussed colon cancer staging is finalized after surgical resection. He has been referred to GI surgery, will see Dr. Dema Severin on 10/9.  -CT shows enlarging right superior paratracheal LN, that has previously been stable on serial CT monitoring for his stage IB lung cancer from 2012.  -Dr. Benay Spice reviewed differential for the enlarging LN includes benign etiology vs lymphoma vs colon cancer metastasis; he discussed this could also be related to lung cancer. He recommends biopsy. We have requested Dr. Rush Landmark or colleague consider if this is feasible via EUS biopsy  -If biopsy is negative for colon cancer, he would likely proceed to colorectal surgery and see [name] back in f/u after to discuss final pathology and adjuvant therapy if needed.  -He will return in 2 weeks for repeat CBC and to review biopsy results.   2. Iron deficiency anemia -Secondary to  bleeding from tumor, exacerbated by anticoagulation with eliquis which is currently on hold  -He presented with symptomatic iron deficiency anemia, most recent Hgb 9.5 on 9/26.  -He will continue oral iron therapy  -Plan to repeat CBC in 2 weeks at next f/u.  3. 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 Elenor Quinones at [facility]. Last Ct chest 05/01/18 stable, without concerning evidence of recurrence    Orders Placed This Encounter  Procedures  . CBC with Differential (Cancer Center Only)    Standing Status:   Future    Standing Expiration Date:   07/01/2019    All questions were answered. The patient knows to call the clinic with any problems, questions or concerns.     Alla Feeling, NP 07/01/2018  This was a shared visit with Cira Rue.  Mr. Everingham was interviewed and examined.  We reviewed the recent and remote CT images with Mr. Kimberley and his family. He has been diagnosed with adenocarcinoma of the sigmoid colon.  He appears to have early stage colon cancer and is a candidate for primary resection. There is an enlarging mediastinal lymph node of unclear etiology.  The lymph node could be benign or related to a malignancy.  The differential diagnosis includes recurrence of lung cancer, lymphoma, and metastatic colorectal cancer.  He will be referred for an EUS biopsy of the mediastinal lymph node.  He will begin ferrous sulfate twice daily.  We will check a CBC when he returns in 2 weeks.  We referred him to Dr. Dema Severin.  Julieanne Manson, MD

## 2018-07-01 ENCOUNTER — Encounter: Payer: Self-pay | Admitting: Nurse Practitioner

## 2018-07-01 ENCOUNTER — Telehealth: Payer: Self-pay

## 2018-07-01 ENCOUNTER — Encounter (HOSPITAL_COMMUNITY): Payer: Commercial Managed Care - PPO

## 2018-07-01 ENCOUNTER — Other Ambulatory Visit: Payer: Self-pay

## 2018-07-01 DIAGNOSIS — C189 Malignant neoplasm of colon, unspecified: Secondary | ICD-10-CM

## 2018-07-01 DIAGNOSIS — I899 Noninfective disorder of lymphatic vessels and lymph nodes, unspecified: Secondary | ICD-10-CM

## 2018-07-01 NOTE — Telephone Encounter (Signed)
-----   Message from Irving Copas., MD sent at 07/01/2018  8:17 AM EDT ----- Courtney Heys, Thanks for reaching back out. I looked at the imaging and also had Linna Hoff look at it as well. We can definitely give it a try though this particular level can sometimes be difficult. Maebel Marasco, can you look for a next available for Dan or myself as an EUS Radial/Linear with FNA/FNB of paratracheal lymph node? Thanks. Gabe ----- Message ----- From: Alla Feeling, NP Sent: 06/30/2018   4:20 PM EDT To: Arna Snipe, RN, Ladell Pier, MD, #  Dr. Rush Landmark,   I saw this patient today with Dr. Benay Spice who recommends biopsy of the enlarged right superior paratracheal node. Is this something you can do via EUS?   Dawn, can you please check and see if he has appt with colorectal surgery?   Thanks,  Regan Rakers NP

## 2018-07-01 NOTE — Progress Notes (Signed)
Subjective:    Patient ID: Troy Kenner [name], male    DOB: 24-May-1941, 77 y.o.   MRN: [id number]  HPI The patient is here for follow up from the ED. he is here with his wife.  He went to the ED on 9/23 for weakness.  He was having increasing weakness.  The day prior he felt dizzy while walking and fell into the bushes due to generalized weakness.  He denied chest pain, SOB, nausea, vomiting and abdominal pain.  He did notice black stools for a few days.  He denies nsaid use.  He had a low hgb, hemoccult positive stools. GI was consulted.  EKG showed Afib. MRI of brain was neg for acute changes.     GI bleed, iron def anemia due to adenocarcinoma: Had EGD and colonoscopy Has possible Barrett's - biopsies taken Continue PPI, which she states he is not taking.  He does have Zantac that he takes only as needed, but not regularly.  He was not discharged home with a PPI Colonoscopy showed colon cancer - adenocardinoma eliquis on hold  Monitor CBC-oncology is going to repeat at his next visit Oncology has discussed his colon cancer with him-he has an appointment with surgery and depending on results may or may not need treatment after that CEA  Taking iron twice daily He denies any blood in the stool.  His stools are dark, but he knows that is from the iron. No abdominal pain or other concerning symptoms.  Enlarging lymph node chest: He is scheduled for an endoscopy with Dr. Ardis Hughs this month to do a biopsy of the lymph node  PAfib: eliquis held due to GI bleed Cardiology will discuss long term use of anticoagulation-he does have an appointment scheduled  Generalized weakness, fatigue: There has been some improvement in his generalized weakness and fatigue  CKD: Cr at baseline  Hyperlipidemia: On statins  Hypertension: Continue home medications  COPD: Continue inhalers  He is taking all of his medications as prescribed.  Medications and allergies reviewed with patient and  updated if appropriate.  Patient Active Problem List   Diagnosis Date Noted  . Adenocarcinoma of sigmoid colon ([place]) 06/30/2018  . Acute GI bleeding 06/23/2018  . Acute blood loss anemia 06/23/2018  . Weakness generalized 06/23/2018  . Depression 05/06/2018  . Syncope 01/29/2018  . Chronic diastolic CHF (congestive heart failure) ([place]) 05/04/2017  . CAD (coronary artery disease) 05/03/2017  . MGUS (monoclonal gammopathy of unknown significance) 03/07/2016  . Benign essential HTN 01/19/2016  . OSA (obstructive sleep apnea) 08/29/2015  . PAF (paroxysmal atrial fibrillation) ([place])   . Glaucoma 06/22/2015  . Dyspnea 02/04/2015  . Memory loss, short term 08/03/2014  . Anemia, unspecified 12/28/2013  . Occlusion and stenosis of carotid artery without mention of cerebral infarction 07/09/2013  . COPD GOLD II  07/09/2013  . Chronic renal disease, stage 3, moderately decreased glomerular filtration rate between 30-59 mL/min/1.73 square meter (HCC) 07/06/2013  . TIA (transient ischemic attack) 07/06/2013  . Right carotid bruit 12/01/2012  . Basal cell carcinoma of skin 10/10/2012  . History of lung cancer 03/11/2012  . PAD (peripheral artery disease) ([place]) 12/16/2011  . AAA (abdominal aortic aneurysm) ([place]) 09/27/2011  . Squamous cell lung cancer ([place]) 11/08/2010  . GAIT IMBALANCE 03/02/2009  . Prediabetes 03/02/2009  . COLONIC POLYPS, HX OF 03/02/2009  . OBSTRUCTIVE CHRONIC BRONCHITIS WITHOUT EXACERBAT 01/15/2008  . HYPERPLASIA PROSTATE UNS W/O UR OBST & OTH LUTS 01/15/2008  . HYPERLIPIDEMIA 09/29/2007  .  Peripheral vascular disease ([name]) 09/29/2007  . LEUKOPLAKIA, VOCAL CORDS 09/29/2007  . FEMORAL BRUIT 09/29/2007    Current Outpatient Medications on File Prior to Visit  Medication Sig Dispense Refill  . acetaminophen (TYLENOL) 325 MG tablet Take 650 mg every 6 (six) hours as needed by mouth for moderate pain or headache.    [name] amiodarone (PACERONE) 200 MG tablet Take 0.5 tablets  (100 mg total) by mouth daily.    . bimatoprost (LUMIGAN) 0.01 % SOLN Place 1 drop at bedtime into both eyes.     . Biotin 10000 MCG TABS Take 10,000 mcg daily by mouth.     . dorzolamide-timolol (COSOPT) 22.3-6.8 MG/ML ophthalmic solution Place 2 drops into both eyes 2 (two) times daily.     [name] FLUoxetine (PROZAC) 20 MG capsule TAKE (1) CAPSULE DAILY. (Patient taking differently: Take 20 mg by mouth daily. ) 90 capsule 1  . furosemide (LASIX) 20 MG tablet Take 1 tablet (20 mg total) by mouth daily. 30 tablet 11  . iron polysaccharides (POLY-IRON 150) 150 MG capsule Take 150 mg by mouth at bedtime.     . Magnesium 500 MG CAPS Take 500 mg by mouth at bedtime.    . Melatonin 5 MG TABS Take 5 mg by mouth at bedtime.    . montelukast (SINGULAIR) 10 MG tablet Take 1 tablet (10 mg total) by mouth at bedtime. 30 tablet 5  . Multiple Vitamins-Minerals (CENTRUM SILVER 50+MEN) TABS Take 1 tablet by mouth at bedtime.    Vladimir Faster Glycol-Propyl Glycol (SYSTANE OP) Place 1-2 drops as needed into both eyes (for dry eyes).    . potassium chloride SA (K-DUR,KLOR-CON) 20 MEQ tablet TAKE 1 TABLET ONCE DAILY. (Patient taking differently: Take 20 mEq by mouth daily. ) 30 tablet 3  . ranitidine (ZANTAC) 150 MG tablet Take 150 mg by mouth daily as needed for heartburn.     . rosuvastatin (CRESTOR) 40 MG tablet TAKE 1 TABLET ONCE DAILY. (Patient taking differently: Take 40 mg by mouth at bedtime. ) 30 tablet 2   No current facility-administered medications on file prior to visit.     Past Medical History:  Diagnosis Date  . AAA (abdominal aortic aneurysm) (HCC) LAST ABD. [name]  JAN 2014  3.4CM   MONITORED BY DR [name]  . Arthritis   . Atrial fibrillation ([place])   . Bilateral carotid artery stenosis DUPLEX 12-29-2012  BY DR [facility]   BILATERAL ICA STENOSIS 60-79%  . BPH (benign prostatic hypertrophy)   . Cancer ([place]) 01/25/2012   LUNG  . Complication of anesthesia    had nausea after carotid artery surgery,  states the medicine given to help the nausea, made it worse  . COPD (chronic obstructive pulmonary disease) ([place])   . GERD (gastroesophageal reflux disease)   . Glaucoma BOTH EYES   Dr Gershon Crane  . History of basal cell carcinoma excision   . History of lung cancer APRIL 2012  SQUAMOUS CELL---- S/P RIGHT LOWER LOBECTOMY AT [facility] --  NO CHEMORADIATION---  NO RECURRENCE    ONCOLOGIST- DR Tressie Stalker  LOV [facility] 10-27-2012  . Hx of adenomatous colonic polyps 2005    X 2; 1 hyperplastic polyp; Dr Olevia Perches  . Hyperlipidemia   . Hypertension   . Impaired fasting glucose 2007   108; A1c5.4%  . Lesion of bladder   . Microhematuria   . OSA (obstructive sleep apnea) 08/29/2015   Mild with AHI overall 6/hr but severe during REM sleep with AHI 30/hr  with oxygen saturations < 88% for 5.5 minutes of total sleep time.    [name] PAD (peripheral artery disease) ([place]) ABI'S  JAN 2014  0.65 ON RIGHT ;  1.04 ON LEFT  . Peripheral vascular disease ([place]) S/P ANGIOPLASTY AND STENTING   FOLLOWED  BY DR [name]  . Spinal stenosis Sept. 2015  . Stroke [facility]) Jul 07, 2013   mini  TIA  . Urgency of urination     Past Surgical History:  Procedure Laterality Date  . ANGIO PLASTY     X 4 in legs  . AORTOGRAM  07-27-2002   MILD DIFFUSE ILIAC ARTERY OCCLUSIVE DISEASE /  LEFT RENAL ARTERY 20%/ PATENT LEFT FEM-POP GRAFT/ MILD SFA AND POPLITEAL ARTERY OCCLUSIVE DISEASE W/ SEVERE KIDNEY OCCLUSIVE DISEASE  . BASAL CELL CARCINOMA EXCISION     MULTIPLE TIMES--  RIGHT FOREARM, CHEEKS, AND BACK  . BIOPSY  06/25/2018   Procedure: BIOPSY;  Surgeon: Rush Landmark Telford Nab., MD;  Location: [place];  Service: Gastroenterology;;  . CARDIOVASCULAR STRESS TEST  12-08-2012  DR MCLEAN   NORMAL LEXISCAN WITH NO EXERCISE NUCLEAR STUDY/ EF 66%/   NO ISCHEMIA/ NO SIGNIFICANT CHANGE FROM PRIOR STUDY  . CARDIOVERSION N/A 02/14/2018   Procedure: CARDIOVERSION;  Surgeon: Larey Dresser, MD;  [facility] ENDOSCOPY;  Service: Cardiovascular;   Laterality: N/A;  . CAROTID ANGIOGRAM N/A 07/10/2013   Procedure: CAROTID ANGIOGRAM;  Surgeon: Elam Dutch, MD;  Location: [facility] CATH LAB;  Service: Cardiovascular;  Laterality: N/A;  . CAROTID ENDARTERECTOMY Right 07-14-13   cea  . CATARACT EXTRACTION W/ INTRAOCULAR LENS  IMPLANT, BILATERAL    . colon polyectomy    . COLONOSCOPY WITH PROPOFOL N/A 06/25/2018   Procedure: COLONOSCOPY WITH PROPOFOL;  Surgeon: Rush Landmark Telford Nab., MD;  Location: [place];  Service: Gastroenterology;  Laterality: N/A;  . CYSTOSCOPY W/ RETROGRADES Bilateral 01/21/2013   Procedure: CYSTOSCOPY WITH RETROGRADE PYELOGRAM;  Surgeon: Molli Hazard, MD;  [facility];  Service: Urology;  Laterality: Bilateral;   CYSTO, BLADDER BIOPSY, BILATERAL RETROGRADE PYELOGRAM  RAD TECH FROM RADIOLOGY PER JOY  . CYSTOSCOPY WITH BIOPSY N/A 01/21/2013   Procedure: CYSTOSCOPY WITH BIOPSY;  Surgeon: Molli Hazard, MD;  Location: [facility];  Service: Urology;  Laterality: N/A;  . ENDARTERECTOMY Right 07/14/2013   Procedure: ENDARTERECTOMY CAROTID;  Surgeon: Angelia Mould, MD;  Location: [place];  Service: Vascular;  Laterality: Right;  . EP IMPLANTABLE DEVICE N/A 08/12/2015   Procedure: Loop Recorder Insertion;  Surgeon: Thompson Grayer, MD;  Location: [place] CV LAB;  Service: Cardiovascular;  Laterality: N/A;  . ESOPHAGOGASTRODUODENOSCOPY (EGD) WITH PROPOFOL N/A 06/25/2018   Procedure: ESOPHAGOGASTRODUODENOSCOPY (EGD) WITH PROPOFOL;  Surgeon: Rush Landmark Telford Nab., MD;  Location: [place];  Service: Gastroenterology;  Laterality: N/A;  . FEMORAL-POPLITEAL BYPASS GRAFT Left [facility]   AND [facility]  IN [place]  . FEMORAL-POPLITEAL BYPASS GRAFT Left 08/30/2015   Procedure: REVISION OF BYPASS GRAFT Left  FEMORAL-POPLITEAL ARTERY;  Surgeon: Angelia Mould, MD;  Location: [place];  Service: Vascular;  Laterality: Left;  . LARYNGOSCOPY  06-27-2004   BX  VOCAL CORD  (LEUKOPLAKIA)  PER PT NO ISSUES SINCE  . LEFT HEART CATH AND CORONARY ANGIOGRAPHY N/A 08/20/2017   Procedure: LEFT HEART CATH AND CORONARY ANGIOGRAPHY;  Surgeon: Larey Dresser, MD;  Location: [place] CV LAB;  Service: Cardiovascular;  Laterality: N/A;  . LOWER EXTREMITY ANGIOGRAM Bilateral 08/29/2015   Procedure: Lower Extremity Angiogram;  Surgeon:  Angelia Mould, MD;  Location: [place] CV LAB;  Service: Cardiovascular;  Laterality: Bilateral;  . LUNG LOBECTOMY  01/24/2011    RIGHT UPPER LOBE  (SQUAMOUS CELL CARCINOMA) Dr Dorthea Cove , [facility]. No chemotherapyor radiation  . PATCH ANGIOPLASTY Right 07/14/2013   Procedure: PATCH ANGIOPLASTY;  Surgeon: Angelia Mould, MD;  Location: [place];  Service: Vascular;  Laterality: Right;  . PATCH ANGIOPLASTY Left 08/30/2015   Procedure: VEIN PATCH ANGIOPLASTY OF PROXIMAL Left BYPASS GRAFT;  Surgeon: Angelia Mould, MD;  Location: [place];  Service: Vascular;  Laterality: Left;  . PERIPHERAL VASCULAR CATHETERIZATION N/A 08/29/2015   Procedure: Abdominal Aortogram;  Surgeon: Angelia Mould, MD;  Location: [place] CV LAB;  Service: Cardiovascular;  Laterality: N/A;  . POLYPECTOMY  06/25/2018   Procedure: POLYPECTOMY;  Surgeon: Rush Landmark Telford Nab., MD;  Location: [place];  Service: Gastroenterology;;  . Lia Foyer INJECTION  06/25/2018   Procedure: SUBMUCOSAL INJECTION;  Surgeon: Irving Copas., MD;  Location: [place];  Service: Gastroenterology;;  . TEE WITHOUT CARDIOVERSION N/A 02/14/2018   Procedure: TRANSESOPHAGEAL ECHOCARDIOGRAM (TEE);  Surgeon: Larey Dresser, MD;  Location[facility] ENDOSCOPY;  Service: Cardiovascular;  Laterality: N/A;  . trabecular surgery     OS  . TRANSTHORACIC ECHOCARDIOGRAM  12-29-2012  DR [facility]   MILD LVH/  LVSF NORMAL/ EF 32-20%/  GRADE I DIASTOLIC DYSFUNCTION    Social History   Socioeconomic History  . Marital status: Married    Spouse name: [name]   .  Number of children: 3  . Years of education: 12+  . Highest education level: Not on file  Occupational History  . 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 Financial resource strain: Not on file  . Food insecurity:    Worry: Not on file    Inability: Not on file  . Transportation needs:    Medical: Not on file    Non-medical: Not on file  Tobacco Use  . Smoking status: Former Smoker    Packs/day: 0.50    Years: 50.00    Pack years: 25.00    Types: Cigarettes    Last attempt to quit: 07/28/2014    Years since quitting: 3.9  . Smokeless tobacco: Never Used  Substance and Sexual Activity  . Alcohol use: Yes    Alcohol/week: 0.0 standard drinks    Comment:  socially, variable  . Drug use: No  . Sexual activity: Not on file  Lifestyle  . Physical activity:    Days per week: Not on file    Minutes per session: Not on file  . Stress: Not on file  Relationships  . Social connections:    Talks on phone: Not on file    Gets together: Not on file    Attends religious service: Not on file    Active member of club or organization: Not on file    Attends meetings of clubs or organizations: Not on file    Relationship status: Not on file  Other Topics Concern  . Not on file  Social History Narrative   Patient lives at home with spouse [name]   Patient has 3 children    Patient is right handed     Family History  Problem Relation Age of Onset  . Stroke Mother        mini strokes  . Alcohol  abuse Father   . Heart disease Father        MI after 50  . Stroke Father   . Hypertension Father   . Heart attack Father   . Heart disease Paternal Aunt        several  . Hypertension Paternal Aunt        several  . Stroke Paternal Aunt        several  . Stroke Paternal Uncle        several  . Heart disease Paternal Uncle        several;2 had MI pre  62  . Cancer Daughter 46       breast ca, also with benign sessile polyp     Review of Systems  Constitutional: Positive for fatigue (improved). Negative for chills and fever.  Respiratory: Positive for shortness of breath. Negative for cough and wheezing.   Cardiovascular: Negative for chest pain, palpitations and leg swelling.  Gastrointestinal: Negative for abdominal pain, blood in stool and nausea.  Neurological: Negative for dizziness, light-headedness and headaches.       Objective:   Vitals:   07/02/18 1300  BP: (!) 148/74  Pulse: 75  Resp: 16  Temp: 99.1 F (37.3 C)  SpO2: 96%   BP Readings from Last 3 Encounters:  07/02/18 (!) 148/74  06/30/18 (!) 147/68  06/26/18 122/66   Wt Readings from Last 3 Encounters:  07/02/18 201 lb (91.2 kg)  06/30/18 201 lb 14.4 oz (91.6 kg)  06/26/18 201 lb 15.1 oz (91.6 kg)   Body mass index is 26.52 kg/m.   Physical Exam    Constitutional: Appears well-developed and well-nourished. No distress.  HENT:  Head: Normocephalic and atraumatic.  Neck: Neck supple. No tracheal deviation present. No thyromegaly present.  No cervical lymphadenopathy Cardiovascular: Normal rate, regular rhythm and normal heart sounds.   No murmur heard. No carotid bruit .  No edema Pulmonary/Chest: Effort normal and breath sounds normal. No respiratory distress. No has no wheezes. No rales. Abdomen: Soft, nontender, nondistended Skin: Skin is warm and dry. Not diaphoretic.  Psychiatric: Normal mood and affect. Behavior is normal.   06/23/18:  CT of head:  IMPRESSION: Negative for acute intracranial abnormality.  06/23/18;  MRI of Brain wo contrast: IMPRESSION: Chronic ischemic microangiopathy without acute intracranial Abnormality.  06/25/18  Ct chest and Abdomen:  IMPRESSION: CT Chest:  1. Multiple very small peripheral nodules involving the BILATERAL UPPER lobes, none larger than 2-3 mm, a nonspecific finding. No dominant pulmonary  nodules. 2. Enlarging SUPERIOR RIGHT paratracheal (station 2R) lymph node since November, 2018. Multiple normal sized lymph nodes are present elsewhere throughout the mediastinum which are stable. 3.  No acute cardiopulmonary disease. 4.  Emphysema. (ICD10-J43.9) 5. Postsurgical pleural and parenchymal scarring related to the prior RIGHT LOWER lobectomy. Stable chronic RIGHT pleural effusion. 6.  Aortic Atherosclerosis (ICD10-170.0)  CT Abdomen and Pelvis:  1. Mass involving the mid sigmoid colon which corresponds to the patient's known colon cancer. 2. No evidence of metastatic disease in the abdomen or pelvis. 3. 3.8 cm saccular infrarenal abdominal aortic aneurysm. (ICD10-I71.9). Recommend followup by ultrasound in 2 years. This recommendation follows ACR consensus guidelines: White Paper of the ACR Incidental Findings Committee II on Vascular Findings. J Am Coll Radiol 2013; 10:789-794. 4. Cholelithiasis without evidence of acute cholecystitis. 5. Hepatic steatosis. 6. Diverticulum arising medially from the descending duodenum. 7. Distal descending and proximal sigmoid colon diverticulosis without evidence of acute diverticulitis. 8. Moderate median lobe  prostate gland enlargement. 9. Scarring involving the LOWER pole the LEFT kidney. 10. Small BILATERAL inguinal hernias containing fat.    Lab Results  Component Value Date   WBC 6.9 06/26/2018   HGB 9.5 (L) 06/26/2018   HCT 29.8 (L) 06/26/2018   PLT 228 06/26/2018   GLUCOSE 146 (H) 06/26/2018   CHOL 105 10/25/2017   TRIG 101 10/25/2017   HDL 35 (L) 10/25/2017   LDLCALC 50 10/25/2017   ALT 12 06/23/2018   AST 19 06/23/2018   NA 140 06/26/2018   K 3.8 06/26/2018   CL 111 06/26/2018   CREATININE 1.25 (H) 06/26/2018   BUN 14 06/26/2018   CO2 23 06/26/2018   TSH 0.751 06/24/2018   PSA 1.53 01/15/2008   INR 1.22 06/23/2018   HGBA1C 5.9 11/04/2017   MICROALBUR 0.4 09/29/2007      Assessment & Plan:    See  Problem List for Assessment and Plan of chronic medical problems.

## 2018-07-01 NOTE — Telephone Encounter (Signed)
EUS scheduled, pt instructed and medications reviewed.  Patient instructions mailed to home.  Patient to call with any questions or concerns.  

## 2018-07-01 NOTE — Telephone Encounter (Signed)
EUS Dr Ardis Hughs on 10/24 at 730 am

## 2018-07-02 ENCOUNTER — Telehealth: Payer: Self-pay | Admitting: Gastroenterology

## 2018-07-02 ENCOUNTER — Encounter: Payer: Self-pay | Admitting: Internal Medicine

## 2018-07-02 ENCOUNTER — Ambulatory Visit: Payer: Commercial Managed Care - PPO | Admitting: Internal Medicine

## 2018-07-02 VITALS — BP 148/74 | HR 75 | Temp 99.1°F | Resp 16 | Ht 73.0 in | Wt 201.0 lb

## 2018-07-02 DIAGNOSIS — N183 Chronic kidney disease, stage 3 unspecified: Secondary | ICD-10-CM

## 2018-07-02 DIAGNOSIS — I480 Paroxysmal atrial fibrillation: Secondary | ICD-10-CM

## 2018-07-02 DIAGNOSIS — I10 Essential (primary) hypertension: Secondary | ICD-10-CM

## 2018-07-02 DIAGNOSIS — C187 Malignant neoplasm of sigmoid colon: Secondary | ICD-10-CM

## 2018-07-02 DIAGNOSIS — R531 Weakness: Secondary | ICD-10-CM

## 2018-07-02 DIAGNOSIS — K922 Gastrointestinal hemorrhage, unspecified: Secondary | ICD-10-CM

## 2018-07-02 MED ORDER — OMEPRAZOLE 20 MG PO CPDR: 20.0000 mg | DELAYED_RELEASE_CAPSULE | Freq: Every day | ORAL | 3 refills | Status: DC

## 2018-07-02 NOTE — Assessment & Plan Note (Signed)
Has an appointment scheduled for surgery Has already seen oncology and depending on results of surgery and lymph node biopsy may not need additional treatment

## 2018-07-02 NOTE — Assessment & Plan Note (Signed)
Following with cardiology Currently not on Eliquis due to GI bleed likely from adenocarcinoma of colon We will follow-up with cardiology

## 2018-07-02 NOTE — Telephone Encounter (Signed)
Patty,  I see he is scheduled for 10/24, please offer/recommend that he come 10/10 instead, I know I have an opening.  Leta Speller get him in next week if he is willing.  Thanks

## 2018-07-02 NOTE — Telephone Encounter (Signed)
thanks

## 2018-07-02 NOTE — Assessment & Plan Note (Signed)
CBC stable before he left the hospital Taking iron twice daily No symptoms or signs of GI bleed actively He knows what to monitor for Has an appointment with oncology and they are going to follow-up his CBC so we will not recheck today

## 2018-07-02 NOTE — Telephone Encounter (Signed)
The pt was rescheduled to 10/10 at 1030 am.  He was re instructed and verbalized understanding.

## 2018-07-02 NOTE — Assessment & Plan Note (Signed)
Stable

## 2018-07-02 NOTE — Assessment & Plan Note (Signed)
Slightly improved since leaving hospital He will continue to monitor and if his weakness or any other symptoms get worse he will call

## 2018-07-02 NOTE — Patient Instructions (Addendum)
   Medications reviewed and updated.  Changes include :   none   If you have any questions or concerns, please call.

## 2018-07-02 NOTE — Assessment & Plan Note (Signed)
Blood pressure slightly elevated here today, but typically better controlled There is some increased anxiety with his recent diagnosis of cancer Continue current medications at current doses and will monitor

## 2018-07-03 ENCOUNTER — Encounter (HOSPITAL_COMMUNITY): Payer: Commercial Managed Care - PPO

## 2018-07-08 ENCOUNTER — Other Ambulatory Visit: Payer: Self-pay

## 2018-07-08 ENCOUNTER — Encounter (HOSPITAL_COMMUNITY): Payer: Self-pay | Admitting: *Deleted

## 2018-07-10 ENCOUNTER — Encounter (HOSPITAL_COMMUNITY): Payer: Self-pay

## 2018-07-10 ENCOUNTER — Encounter (HOSPITAL_COMMUNITY)
Admission: RE | Disposition: A | Discharge status: Home / Self Care | Payer: Self-pay | Source: Ambulatory Visit | Attending: Gastroenterology

## 2018-07-10 ENCOUNTER — Ambulatory Visit (HOSPITAL_COMMUNITY)
Admission: RE | Admit: 2018-07-10 | Discharge: 2018-07-10 | Disposition: A | Discharge status: Home / Self Care | Payer: Commercial Managed Care - PPO | Source: Ambulatory Visit | Attending: Gastroenterology | Admitting: Gastroenterology

## 2018-07-10 ENCOUNTER — Other Ambulatory Visit: Payer: Self-pay

## 2018-07-10 ENCOUNTER — Ambulatory Visit (HOSPITAL_COMMUNITY): Payer: Commercial Managed Care - PPO | Admitting: Anesthesiology

## 2018-07-10 ENCOUNTER — Ambulatory Visit (HOSPITAL_COMMUNITY): Payer: Commercial Managed Care - PPO

## 2018-07-10 DIAGNOSIS — I899 Noninfective disorder of lymphatic vessels and lymph nodes, unspecified: Secondary | ICD-10-CM

## 2018-07-10 DIAGNOSIS — R591 Generalized enlarged lymph nodes: Secondary | ICD-10-CM | POA: Diagnosis not present

## 2018-07-10 DIAGNOSIS — K8020 Calculus of gallbladder without cholecystitis without obstruction: Secondary | ICD-10-CM | POA: Diagnosis not present

## 2018-07-10 DIAGNOSIS — D509 Iron deficiency anemia, unspecified: Secondary | ICD-10-CM | POA: Insufficient documentation

## 2018-07-10 DIAGNOSIS — Z85118 Personal history of other malignant neoplasm of bronchus and lung: Secondary | ICD-10-CM | POA: Diagnosis not present

## 2018-07-10 DIAGNOSIS — Z7901 Long term (current) use of anticoagulants: Secondary | ICD-10-CM | POA: Insufficient documentation

## 2018-07-10 DIAGNOSIS — Z79899 Other long term (current) drug therapy: Secondary | ICD-10-CM | POA: Diagnosis not present

## 2018-07-10 DIAGNOSIS — I714 Abdominal aortic aneurysm, without rupture: Secondary | ICD-10-CM | POA: Diagnosis not present

## 2018-07-10 DIAGNOSIS — C3490 Malignant neoplasm of unspecified part of unspecified bronchus or lung: Secondary | ICD-10-CM | POA: Diagnosis present

## 2018-07-10 DIAGNOSIS — K591 Functional diarrhea: Secondary | ICD-10-CM | POA: Diagnosis not present

## 2018-07-10 DIAGNOSIS — C187 Malignant neoplasm of sigmoid colon: Secondary | ICD-10-CM | POA: Insufficient documentation

## 2018-07-10 DIAGNOSIS — C189 Malignant neoplasm of colon, unspecified: Secondary | ICD-10-CM | POA: Diagnosis not present

## 2018-07-10 DIAGNOSIS — Z880 Allergy status to penicillin: Secondary | ICD-10-CM | POA: Diagnosis not present

## 2018-07-10 DIAGNOSIS — Z888 Allergy status to other drugs, medicaments and biological substances status: Secondary | ICD-10-CM | POA: Insufficient documentation

## 2018-07-10 DIAGNOSIS — I5032 Chronic diastolic (congestive) heart failure: Secondary | ICD-10-CM | POA: Insufficient documentation

## 2018-07-10 DIAGNOSIS — Z902 Acquired absence of lung [part of]: Secondary | ICD-10-CM | POA: Diagnosis not present

## 2018-07-10 DIAGNOSIS — N183 Chronic kidney disease, stage 3 (moderate): Secondary | ICD-10-CM | POA: Diagnosis not present

## 2018-07-10 DIAGNOSIS — K219 Gastro-esophageal reflux disease without esophagitis: Secondary | ICD-10-CM | POA: Diagnosis not present

## 2018-07-10 DIAGNOSIS — Z950 Presence of cardiac pacemaker: Secondary | ICD-10-CM | POA: Diagnosis not present

## 2018-07-10 DIAGNOSIS — Z86010 Personal history of colonic polyps: Secondary | ICD-10-CM | POA: Insufficient documentation

## 2018-07-10 DIAGNOSIS — I2510 Atherosclerotic heart disease of native coronary artery without angina pectoris: Secondary | ICD-10-CM | POA: Diagnosis not present

## 2018-07-10 DIAGNOSIS — I130 Hypertensive heart and chronic kidney disease with heart failure and stage 1 through stage 4 chronic kidney disease, or unspecified chronic kidney disease: Secondary | ICD-10-CM | POA: Diagnosis not present

## 2018-07-10 DIAGNOSIS — I480 Paroxysmal atrial fibrillation: Secondary | ICD-10-CM | POA: Insufficient documentation

## 2018-07-10 DIAGNOSIS — G4733 Obstructive sleep apnea (adult) (pediatric): Secondary | ICD-10-CM | POA: Insufficient documentation

## 2018-07-10 DIAGNOSIS — Z882 Allergy status to sulfonamides status: Secondary | ICD-10-CM | POA: Insufficient documentation

## 2018-07-10 DIAGNOSIS — J449 Chronic obstructive pulmonary disease, unspecified: Secondary | ICD-10-CM | POA: Diagnosis not present

## 2018-07-10 DIAGNOSIS — Z9889 Other specified postprocedural states: Secondary | ICD-10-CM

## 2018-07-10 HISTORY — PX: EUS: SHX5427

## 2018-07-10 HISTORY — DX: Presence of other cardiac implants and grafts: Z95.818

## 2018-07-10 HISTORY — PX: ESOPHAGOGASTRODUODENOSCOPY (EGD) WITH PROPOFOL: SHX5813

## 2018-07-10 HISTORY — PX: FINE NEEDLE ASPIRATION: SHX5430

## 2018-07-10 SURGERY — UPPER ENDOSCOPIC ULTRASOUND (EUS) RADIAL
Anesthesia: Monitor Anesthesia Care

## 2018-07-10 MED ORDER — PROPOFOL 500 MG/50ML IV EMUL
INTRAVENOUS | Status: DC | PRN
  Administered 2018-07-10: 30 mg via INTRAVENOUS

## 2018-07-10 MED ORDER — PROPOFOL 500 MG/50ML IV EMUL
INTRAVENOUS | Status: DC | PRN
  Administered 2018-07-10: 125 ug/kg/min via INTRAVENOUS

## 2018-07-10 MED ORDER — LACTATED RINGERS IV SOLN
INTRAVENOUS | Status: DC
  Administered 2018-07-10: 1000 mL via INTRAVENOUS

## 2018-07-10 MED ORDER — PROPOFOL 10 MG/ML IV BOLUS
INTRAVENOUS | Status: AC
  Filled 2018-07-10: qty 40

## 2018-07-10 MED ORDER — ONDANSETRON HCL 4 MG/2ML IJ SOLN
INTRAMUSCULAR | Status: DC | PRN
  Administered 2018-07-10: 4 mg via INTRAVENOUS

## 2018-07-10 MED ORDER — SODIUM CHLORIDE 0.9 % IV SOLN: INTRAVENOUS | Status: DC

## 2018-07-10 NOTE — Anesthesia Procedure Notes (Signed)
Date/Time: 07/10/2018 11:08 AM Performed by: Glory Buff, CRNA Oxygen Delivery Method: Nasal cannula

## 2018-07-10 NOTE — Interval H&P Note (Signed)
History and Physical Interval Note:  07/10/2018 9:51 AM  Troy Kenner [name]  has presented today for surgery, with the diagnosis of pratracheal lymph nodes  The various methods of treatment have been discussed with the patient and family. After consideration of risks, benefits and other options for treatment, the patient has consented to  Procedure(s): UPPER ENDOSCOPIC ULTRASOUND (EUS) RADIAL (N/A) as a surgical intervention .  The patient's history has been reviewed, patient examined, no change in status, stable for surgery.  I have reviewed the patient's chart and labs.  Questions were answered to the patient's satisfaction.     Milus Banister

## 2018-07-10 NOTE — Op Note (Signed)
[facility] Patient Name: Troy [name] Procedure Date: 07/10/2018 MRN: [id number] Attending MD: Milus Banister , MD Date of Birth: Feb 15, 1941 CSN: [id number] Age: 77 Admit Type: Outpatient Procedure:                Upper EUS Indications:              Remoted squamous cell cancer of lung; newly                            diagnosed sigmoid adenocarcinoma, Staging CT shows                            suspicious right paratracheal adenopathy. Providers:                Milus Banister, MD, Cleda Daub, RN, William Dalton, Technician Referring MD:             Julieanne Manson, MD Medicines:                Monitored Anesthesia Care Complications:            No immediate complications. Estimated blood loss:                            None. Estimated Blood Loss:     Estimated blood loss: none. Procedure:                Pre-Anesthesia Assessment:                           - Prior to the procedure, a History and Physical                            was performed, and patient medications and                            allergies were reviewed. The patient's tolerance of                            previous anesthesia was also reviewed. The risks                            and benefits of the procedure and the sedation                            options and risks were discussed with the patient.                            All questions were answered, and informed consent                            was obtained. Prior Anticoagulants: The patient has                            taken  Eliquis (apixaban), last dose was 3 weeks                            prior to procedure. ASA Grade Assessment: III - A                            patient with severe systemic disease. After                            reviewing the risks and benefits, the patient was                            deemed in satisfactory condition to undergo the                            procedure.                          After obtaining informed consent, the endoscope was                            passed under direct vision. Throughout the                            procedure, the patient's blood pressure, pulse, and                            oxygen saturations were monitored continuously. The                            [id number]) Olympus Linear EUS was                            introduced through the mouth, and advanced to the                            body of the stomach. The upper EUS was accomplished                            without difficulty. The patient tolerated the                            procedure well. Scope In: Scope Out: Findings:      ENDOSCOPIC FINDING (limited views with linear echoendoscope): :      Normal esophagus and stomach.      EUS findings:      1. A collection of several round, well demarcted paraesophageal       lymphnodes was noted in right chest, about 27cm from the incisors. They       measure 4cm across in total and correlate with the paratracheal       adenopathy described on recent CT. The nodes were sampled with two       passes with EUS FNA needles (22 guage once, 25 guage once). The nodes       appeared to directly abut lung/pleura and  although I do not feel the       needle passed into the lung I ordered a CXR to be certain there is no       pneumothorax.      2. Limited evaluation of the pancreas, liver, stomach were all normal. Impression:               - A collection of several round, well demarcted                            paraesophageal lymphnodes was noted in right chest,                            about 27cm from the incisors. They measure 4cm                            across in total and correlate with the paratracheal                            adenopathy described on recent CT. The nodes were                            sampled with two passes with EUS FNA needles (22                            guage once, 25 guage  once). The nodes appeared to                            directly abut lung/pleura and although I do not                            feel the needle passed into the lung I ordered a                            CXR to be certain there is no pneumothorax. Moderate Sedation:      N/A- Per Anesthesia Care Recommendation:           - Discharge patient to home (ambulatory).                           - Await cytology results. Procedure Code(s):        --- Professional ---                           [phone], 69, Esophagogastroduodenoscopy, flexible,                            transoral; with transendoscopic ultrasound-guided                            intramural or transmural fine needle                            aspiration/biopsy(s), (includes endoscopic  ultrasound examination limited to the esophagus,                            stomach or duodenum, and adjacent structures) Diagnosis Code(s):        --- Professional ---                           R59.1, Generalized enlarged lymph nodes CPT copyright 2018 American [employer] Association. All rights reserved. The codes documented in this report are preliminary and upon coder review may  be revised to meet current compliance requirements. Milus Banister, MD 07/10/2018 11:48:47 AM This report has been signed electronically. Number of Addenda: 0

## 2018-07-10 NOTE — Discharge Instructions (Signed)
YOU HAD AN ENDOSCOPIC PROCEDURE TODAY: Refer to the procedure report and other information in the discharge instructions given to you for any specific questions about what was found during the examination. If this information does not answer your questions, please call [place] office at [phone] to clarify.   YOU SHOULD EXPECT: Some feelings of bloating in the abdomen. Passage of more gas than usual. Walking can help get rid of the air that was put into your GI tract during the procedure and reduce the bloating. If you had a lower endoscopy (such as a colonoscopy or flexible sigmoidoscopy) you may notice spotting of blood in your stool or on the toilet paper. Some abdominal soreness may be present for a day or two, also.  DIET: Your first meal following the procedure should be a light meal and then it is ok to progress to your normal diet. A half-sandwich or bowl of soup is an example of a good first meal. Heavy or fried foods are harder to digest and may make you feel nauseous or bloated. Drink plenty of fluids but you should avoid alcoholic beverages for 24 hours. If you had a esophageal dilation, please see attached instructions for diet.    ACTIVITY: Your care partner should take you home directly after the procedure. You should plan to take it easy, moving slowly for the rest of the day. You can resume normal activity the day after the procedure however YOU SHOULD NOT DRIVE, use power tools, machinery or perform tasks that involve climbing or major physical exertion for 24 hours (because of the sedation medicines used during the test).   SYMPTOMS TO REPORT IMMEDIATELY: A gastroenterologist can be reached at any hour. Please call [phone]  for any of the following symptoms:   Following upper endoscopy (EGD, EUS, ERCP, esophageal dilation) Vomiting of blood or coffee ground material  New, significant abdominal pain  New, significant chest pain or pain under the shoulder blades  Painful or  persistently difficult swallowing  New shortness of breath  Black, tarry-looking or red, bloody stools  FOLLOW UP:  If any biopsies were taken you will be contacted by phone or by letter within the next 1-3 weeks. Call [phone]  if you have not heard about the biopsies in 3 weeks.  Please also call with any specific questions about appointments or follow up tests.  

## 2018-07-10 NOTE — Transfer of Care (Signed)
Immediate Anesthesia Transfer of Care Note  Patient: [name]  Procedure(s) Performed: UPPER ENDOSCOPIC ULTRASOUND (EUS) RADIAL (N/A ) FINE NEEDLE ASPIRATION (FNA) LINEAR (N/A )  Patient Location: PACU  Anesthesia Type:MAC  Level of Consciousness: awake, alert  and oriented  Airway & Oxygen Therapy: Patient Spontanous Breathing and Patient connected to nasal cannula oxygen  Post-op Assessment: Report given to RN and Post -op Vital signs reviewed and stable  Post vital signs: Reviewed and stable  Last Vitals:  Vitals Value Taken Time  BP    Temp    Pulse    Resp    SpO2      Last Pain:  Vitals:   07/10/18 0934  TempSrc: Oral  PainSc: 0-No pain         Complications: No apparent anesthesia complications

## 2018-07-10 NOTE — Anesthesia Preprocedure Evaluation (Addendum)
Anesthesia Evaluation  Patient identified by MRN, date of birth, ID band  Reviewed: Allergy & Precautions, NPO status , Patient's Chart, lab work & pertinent test results  History of Anesthesia Complications (+) PONV  Airway Mallampati: II  TM Distance: >3 FB Neck ROM: Full    Dental no notable dental hx.    Pulmonary sleep apnea and Continuous Positive Airway Pressure Ventilation , former smoker,    Pulmonary exam normal breath sounds clear to auscultation       Cardiovascular hypertension, Pt. on medications Normal cardiovascular exam+ dysrhythmias Atrial Fibrillation  Rhythm:Regular Rate:Normal  Carotid stenosis   Neuro/Psych Depression Spinal stenosis CVA, No Residual Symptoms    GI/Hepatic GERD  ,  Endo/Other    Renal/GU   negative genitourinary   Musculoskeletal  (+) Arthritis , Osteoarthritis,    Abdominal   Peds  Hematology negative hematology ROS (+)   Anesthesia Other Findings   Reproductive/Obstetrics negative OB ROS                            Anesthesia Physical Anesthesia Plan  ASA: III  Anesthesia Plan: MAC   Post-op Pain Management:    Induction:   PONV Risk Score and Plan: 2 and Treatment may vary due to age or medical condition, Propofol infusion and Ondansetron  Airway Management Planned: Natural Airway  Additional Equipment:   Intra-op Plan:   Post-operative Plan:   Informed Consent: I have reviewed the patients History and Physical, chart, labs and discussed the procedure including the risks, benefits and alternatives for the proposed anesthesia with the patient or authorized representative who has indicated his/her understanding and acceptance.   Dental advisory given  Plan Discussed with: Surgeon and CRNA  Anesthesia Plan Comments:        Anesthesia Quick Evaluation

## 2018-07-10 NOTE — Progress Notes (Signed)
No vitals recorded 1150 to 1217.  Patient in radiology.

## 2018-07-10 NOTE — Anesthesia Postprocedure Evaluation (Signed)
Anesthesia Post Note  Patient: Troy Kenner [name]  Procedure(s) Performed: UPPER ENDOSCOPIC ULTRASOUND (EUS) RADIAL (N/A ) FINE NEEDLE ASPIRATION (FNA) LINEAR (N/A )     Patient location during evaluation: PACU Anesthesia Type: MAC Level of consciousness: awake and alert Pain management: pain level controlled Vital Signs Assessment: post-procedure vital signs reviewed and stable Respiratory status: spontaneous breathing, nonlabored ventilation and respiratory function stable Cardiovascular status: blood pressure returned to baseline and stable Postop Assessment: no apparent nausea or vomiting Anesthetic complications: no    Last Vitals:  Vitals:   07/10/18 0934  BP: (!) 117/93  Pulse: 80  Resp: 16  Temp: 36.6 C  SpO2: 98%    Last Pain:  Vitals:   07/10/18 0934  TempSrc: Oral  PainSc: 0-No pain                 [name]

## 2018-07-11 ENCOUNTER — Telehealth: Payer: Self-pay

## 2018-07-11 NOTE — Telephone Encounter (Signed)
Spoke to patient's wife about AF episodes recorded over the last several weeks. Dates, times, and durations provided for wife. Wife verbalized understanding and appreciation of information.

## 2018-07-11 NOTE — Telephone Encounter (Signed)
Pt wants the nurse to call her because she believes he has been in A-Fib a lot lately. The best phone number for her is: [phone]

## 2018-07-12 ENCOUNTER — Encounter (HOSPITAL_COMMUNITY): Payer: Self-pay | Admitting: Gastroenterology

## 2018-07-14 ENCOUNTER — Encounter: Payer: Self-pay | Admitting: Internal Medicine

## 2018-07-14 ENCOUNTER — Ambulatory Visit (INDEPENDENT_AMBULATORY_CARE_PROVIDER_SITE_OTHER): Payer: Commercial Managed Care - PPO | Admitting: Internal Medicine

## 2018-07-14 ENCOUNTER — Ambulatory Visit (INDEPENDENT_AMBULATORY_CARE_PROVIDER_SITE_OTHER)
Admission: RE | Admit: 2018-07-14 | Discharge: 2018-07-14 | Disposition: A | Discharge status: Home / Self Care | Payer: Commercial Managed Care - PPO | Source: Ambulatory Visit | Attending: Internal Medicine | Admitting: Internal Medicine

## 2018-07-14 ENCOUNTER — Other Ambulatory Visit (INDEPENDENT_AMBULATORY_CARE_PROVIDER_SITE_OTHER): Payer: Commercial Managed Care - PPO

## 2018-07-14 VITALS — BP 146/90 | HR 63 | Ht 73.0 in | Wt 205.6 lb

## 2018-07-14 DIAGNOSIS — J90 Pleural effusion, not elsewhere classified: Secondary | ICD-10-CM | POA: Diagnosis not present

## 2018-07-14 DIAGNOSIS — R0609 Other forms of dyspnea: Secondary | ICD-10-CM | POA: Diagnosis not present

## 2018-07-14 DIAGNOSIS — J449 Chronic obstructive pulmonary disease, unspecified: Secondary | ICD-10-CM | POA: Diagnosis not present

## 2018-07-14 LAB — CBC WITH DIFFERENTIAL/PLATELET
BASOS PCT: 0.7 % (ref 0.0–3.0)
Basophils Absolute: 0 10*3/uL (ref 0.0–0.1)
EOS ABS: 0.1 10*3/uL (ref 0.0–0.7)
Eosinophils Relative: 1.8 % (ref 0.0–5.0)
HEMATOCRIT: 33.5 % — AB (ref 39.0–52.0)
Hemoglobin: 10.7 g/dL — ABNORMAL LOW (ref 13.0–17.0)
LYMPHS PCT: 15.2 % (ref 12.0–46.0)
Lymphs Abs: 0.7 10*3/uL (ref 0.7–4.0)
MCHC: 31.8 g/dL (ref 30.0–36.0)
MCV: 86.1 fl (ref 78.0–100.0)
MONO ABS: 0.4 10*3/uL (ref 0.1–1.0)
Monocytes Relative: 9.3 % (ref 3.0–12.0)
NEUTROS ABS: 3.4 10*3/uL (ref 1.4–7.7)
Neutrophils Relative %: 73 % (ref 43.0–77.0)
PLATELETS: 263 10*3/uL (ref 150.0–400.0)
RBC: 3.89 Mil/uL — ABNORMAL LOW (ref 4.22–5.81)
RDW: 16.7 % — AB (ref 11.5–15.5)
WBC: 4.7 10*3/uL (ref 4.0–10.5)

## 2018-07-14 LAB — SEDIMENTATION RATE: SED RATE: 108 mm/h — AB (ref 0–20)

## 2018-07-14 NOTE — Progress Notes (Signed)
Subjective:     Patient ID: Troy Kenner [name], male   DOB: Sep 02, 1941     MRN: [id number]  Brief patient profile:  37  yowm quit smoking Oct 2014 s/p RLLobecty 2012 at [facility] with Ib Sq cell ca and no endobronchial dz at that time and with no RT / chemo being followed by [facility] / [facility] with last ct chest done in 03/2014 showing persistent moderate R effusion  due for f/u 04/2015 with new onset gen persistent   fatigue winter of 2015 then very abruptly ill 01/08/15 sore throat, cough congestion yellow mucus, light headed rx doxy / pred and referrred to pulmonary clninic 02/03/2015 by Dr Linna Darner > dx was pna> cleared  Out then f/u ct at [facility] 04/19/17 showed ? Nodules in RLL and referred himself to pulmonary clinic 08/08/2017    History of Present Illness  08/08/2017  ov/Taquita Demby re:  Consultation req by Dr Aundra Dubin  Chief Complaint  Patient presents with  . Follow-up    Pt is having SOB with exertion for last 6 months becoming bothersome. Pt is wheezing and has dry cough.   doe = sob getting dressed x 4-5 months  Sleeps ok Legs R > L claudication stops him before his breathing does  On amiodarone  X  03/05/17 but only 100 mg since 03/28/17  rec Complete the cardiac work up then return here to regroup if breathing not back to your level of satisfaction      08/20/17  Nl LHC/ lvedp =13 > referred back to pulmonary   09/02/2017  f/u ov/Troy [name] re:   GOLD II copd/ on amiodarone and no pulmonary meds  Chief Complaint  Patient presents with  . Follow-up    Pt states Dr Aundra Dubin rec that he come back for f/u. He states his breathing is unchanged since the last visit here.   Walking regularly around his neighborhood including some hills now but really doesn't know how to pace himself and has not been observing his 02  Saturations and shows interest in rehabilitation. rec Keep walking with goal of 30 minutes daily at a pace where you are short of breath but never out of breath You need to buy a pulse oximeter and  monitor your oxygen level when you walk and call me if you notice a downward trend in your 02 saturations or exercise toleranc (goal is keep it above 90%)  We will be referring you for pulmonary rehab    12/27/2017  f/u ov/Damar Petit re: GOLD II copd/ still on amiodarone   Chief Complaint  Patient presents with  . Follow-up    congestion x 3days no cough , SOB with activity    Dyspnea:  Improving / not limited  Cough: last few days/ no excess mucus  Sleep: ok  SABA use:  None  rec mucinex up to 1200 mg every 12 hours as needed    01/20/18  sood Chest xray today NielMed Sinus rinse daily Flonase daily Singulair 10 mg pill nightly Symbicort two puffs twice per day, and rinse mouth after each use Prednisone 10 mg pill > 3 pills daily for 2 days, 2 pills daily for 2 days, 1 pill daily for 2 days       Admit date: 01/29/2018 Discharge date: 02/01/2018  Admitted From: Home Disposition: Home Recommendations for Outpatient Follow-up:  1. Follow up with PCP in 1-2 weeks 2. Please obtain BMP/CBC in one week    Home Health yes Equipment/Devices: ROLLATOR Discharge Condition: Stable CODE  STATUS: Full code Diet recommendation: Cardiac diet  Brief Narrative: Troy [name] a76 y.o.male,with past medic history significant for COPD/lung cancer/atrial fibrillation, followed by Dr. Halford Chessman FROM PULMONARY ,presenting With multiple falls and weakness for the last to days. One week ago he presented to pulmonary for chest congestion and he was started on steroids and doxycyclinehiscongestion improved however he started getting weak and had 3 falls until today when he fell and sustained a laceration on his left forehead. CT of the head was negativeearachewas having increase in tremors in his upper extremities noted by family. No chest pains, shortness of breath, nausea or vomiting lately. His labs were almost at baseline with a slightly increased creatinine to 1.54 but he was noted to Be  orthostatic in the emergency room .Patient admitted with recurrent syncopal episodes secondary to orthostasis.       Discharge Diagnoses:  Active Problems:   Near syncope   Syncope   Orthostatic hypotension syncope: Secondary to orthostatics dehydration patient being on Lasix and decreased p.o. intake.  Patient ambulated today with physical therapy and with the staff without any complaints.  Patient will be discharged home on Lasix to be started at 20 mg 02/05/2018 and doxazosin to be started on 02/05/2018 to  2 mg daily.  Diltiazem DC'd.  #2 atrial fibrillation:Persistent but usually paroxysmal. Currently on Apixaban, amiodarone. Diltiazem currently on hold  #3 chronic diastolic KXF:GHWEXHB compensated. Lasix currently on hold.  #4 COPD: Stable. Continue current treatment  #5 hyperlipidemia: Continue statin  #6 history of ZJI:RCVELF.  #7 coronary artery disease:Patient had left heart Cath in November of last year with nonobstructive disease. Continue follow up by cardiology  #8 hypertension:Patient has orthostatic hypotension in the moment. Holding medications.    07/14/2018 acute extended ov/Troy [name] re:  Chief Complaint  Patient presents with  . Acute Visit    Pt c/o increased DOE x 2 wks. He states he gets winded just getting dressed.  He also c/o wheezing and non prod cough.    Stopped symbicort shortly after last ov in 12/2017 and stayed on singulair  Started symbicort 160 x 2 back one hour prior to ov and feels a little better  Prior to taking symbicort sob with adl's and 5 laps in driveway flat 810 ft  Sleeping flat on side / one pillow/ cpap Cough is more day than noct   No obvious day to day or daytime variability or assoc excess/ purulent sputum or mucus plugs or hemoptysis or cp or chest tightness,   or overt sinus or hb symptoms.   Sleeping as bbove  without nocturnal  or early am exacerbation  of respiratory  c/o's or need for noct saba. Also denies  any obvious fluctuation of symptoms with weather or environmental changes or other aggravating or alleviating factors except as outlined above   No unusual exposure hx or h/o childhood pna/ asthma or knowledge of premature birth.  Current Allergies, Complete Past Medical History, Past Surgical History, Family History, and Social History were reviewed in [employer] record.  ROS  The following are not active complaints unless bolded Hoarseness, sore throat, dysphagia, dental problems, itching, sneezing,  nasal congestion or discharge of excess mucus or purulent secretions, ear ache,   fever, chills, sweats, unintended wt loss or wt gain, classically pleuritic or exertional cp,  orthopnea pnd or arm/hand swelling  or leg swelling, presyncope, palpitations, abdominal pain, anorexia, nausea, vomiting, diarrhea  or change in bowel habits or change in bladder habits, change in  stools or change in urine, dysuria, hematuria,  rash, arthralgias, visual complaints, headache, numbness, weakness or ataxia or problems with walking or coordination,  change in mood= anxiety  or  memory.        Current Meds  Medication Sig  . acetaminophen (TYLENOL) 325 MG tablet Take 650 mg every 6 (six) hours as needed by mouth for moderate pain or headache.  [name] amiodarone (PACERONE) 200 MG tablet Take 0.5 tablets (100 mg total) by mouth daily.  . bimatoprost (LUMIGAN) 0.01 % SOLN Place 1 drop at bedtime into both eyes.   . Biotin 10000 MCG TABS Take 10,000 mcg daily by mouth.   . dorzolamide-timolol (COSOPT) 22.3-6.8 MG/ML ophthalmic solution Place 2 drops into both eyes 2 (two) times daily.   . ferrous sulfate 325 (65 FE) MG tablet Take 325 mg by mouth 2 (two) times daily with a meal.  . FLUoxetine (PROZAC) 20 MG capsule TAKE (1) CAPSULE DAILY.  . furosemide (LASIX) 20 MG tablet Take 1 tablet (20 mg total) by mouth daily.  . Magnesium 500 MG CAPS Take 500 mg by mouth at bedtime.  . Melatonin 5 MG TABS  Take 5 mg by mouth at bedtime.  . montelukast (SINGULAIR) 10 MG tablet Take 1 tablet (10 mg total) by mouth at bedtime.  . Multiple Vitamins-Minerals (CENTRUM SILVER 50+MEN) TABS Take 1 tablet by mouth at bedtime.  [name] omeprazole (PRILOSEC) 20 MG capsule Take 1 capsule (20 mg total) by mouth daily.  Vladimir Faster Glycol-Propyl Glycol (SYSTANE OP) Place 1-2 drops as needed into both eyes (for dry eyes).  . potassium chloride SA (K-DUR,KLOR-CON) 20 MEQ tablet TAKE 1 TABLET ONCE DAILY.  . rosuvastatin (CRESTOR) 40 MG tablet TAKE 1 TABLET ONCE DAILY.                   Objective:   Physical Exam  amb wm nad    07/14/2018     205  12/27/2017       208  09/02/2017       209   08/08/2017      214   02/03/15 197 lb (89.359 kg)  01/12/15 200 lb 8 oz (90.946 kg)  12/28/14 200 lb (90.719 kg)       Vital signs reviewed - Note on arrival 02 sats  92% on RA      HEENT: nl dentition, turbinates bilaterally, and oropharynx. Nl external ear canals without cough reflex   NECK :  without JVD/Nodes/TM/ nl carotid upstrokes bilaterally   LUNGS: no acc muscle use,  Nl contour chest with decreased bs/ dullness R base and  A few bibasilar crackles on insp    CV:  RRR  no s3 or murmur or increase in P2, and trace sym pedal edema bilaterally   ABD:  soft and nontender with nl inspiratory excursion in the supine position. No bruits or organomegaly appreciated, bowel sounds nl  MS:  Nl gait/ ext warm without deformities, calf tenderness, cyanosis or clubbing No obvious joint restrictions   SKIN: warm and dry without lesions    NEURO:  alert, approp, nl sensorium with  no motor or cerebellar deficits apparent.         CXR PA and Lateral:   07/14/2018 :    I personally reviewed images and agree with radiology impression as follows:   No residual pneumothorax. Persistent moderate-sized right pleural effusion associated with volume loss of the right lung. Mild compensatory hyperinflation of the  left lung. Decreased  pulmonary interstitial markings may reflect decreased interstitial edema. No alveolar pneumonia.    Labs ordered/ reviewed:      Chemistry      Component Value Date/Time   NA 137 07/14/2018 1716   NA 142 03/07/2016 1213   K 4.0 07/14/2018 1716   K 4.5 03/07/2016 1213   CL 103 07/14/2018 1716   CO2 24 07/14/2018 1716   CO2 25 03/07/2016 1213   BUN 22 07/14/2018 1716   BUN 12.5 03/07/2016 1213   CREATININE 1.39 07/14/2018 1716   CREATININE 1.62 (H) 09/20/2016 1141   CREATININE 1.2 03/07/2016 1213      Component Value Date/Time   CALCIUM 9.2 07/14/2018 1716   CALCIUM 9.8 03/07/2016 1213   ALKPHOS 61 06/23/2018 1449   ALKPHOS 75 03/07/2016 1213   AST 19 06/23/2018 1449   AST 18 03/07/2016 1213   ALT 12 06/23/2018 1449   ALT 23 03/07/2016 1213   BILITOT 0.4 06/23/2018 1449   BILITOT 0.50 03/07/2016 1213        Lab Results  Component Value Date   WBC 4.7 07/14/2018   HGB 10.7 (L) 07/14/2018   HCT 33.5 (L) 07/14/2018   MCV 86.1 07/14/2018   PLT 263.0 07/14/2018      EOS                                                              0.1                                      07/14/2018       Lab Results  Component Value Date   TSH 0.03 (L) 07/14/2018     Lab Results  Component Value Date   PROBNP 279.0 (H) 07/14/2018       Lab Results  Component Value Date   ESRSEDRATE 108 (H) 07/14/2018   ESRSEDRATE 60 (H) 08/05/2017   ESRSEDRATE 12 03/07/2016            Assessment:

## 2018-07-14 NOTE — Patient Instructions (Addendum)
Continue symbicort 160 Take 2 puffs first thing in am and then another 2 puffs about 12 hours later.    Please remember to go to the lab and x-ray department downstairs in the basement  for your tests - we will call you with the results when they are available.

## 2018-07-15 ENCOUNTER — Encounter (HOSPITAL_COMMUNITY): Payer: Self-pay | Admitting: Cardiology

## 2018-07-15 ENCOUNTER — Ambulatory Visit (HOSPITAL_COMMUNITY)
Admission: RE | Admit: 2018-07-15 | Discharge: 2018-07-15 | Disposition: A | Discharge status: Home / Self Care | Payer: Commercial Managed Care - PPO | Source: Ambulatory Visit | Attending: Cardiology | Admitting: Cardiology

## 2018-07-15 ENCOUNTER — Telehealth: Payer: Self-pay | Admitting: *Deleted

## 2018-07-15 ENCOUNTER — Other Ambulatory Visit: Payer: Self-pay

## 2018-07-15 ENCOUNTER — Encounter: Payer: Self-pay | Admitting: Internal Medicine

## 2018-07-15 VITALS — BP 120/68 | HR 66 | Wt 204.5 lb

## 2018-07-15 DIAGNOSIS — E785 Hyperlipidemia, unspecified: Secondary | ICD-10-CM | POA: Insufficient documentation

## 2018-07-15 DIAGNOSIS — I2510 Atherosclerotic heart disease of native coronary artery without angina pectoris: Secondary | ICD-10-CM | POA: Insufficient documentation

## 2018-07-15 DIAGNOSIS — I714 Abdominal aortic aneurysm, without rupture: Secondary | ICD-10-CM | POA: Insufficient documentation

## 2018-07-15 DIAGNOSIS — N183 Chronic kidney disease, stage 3 (moderate): Secondary | ICD-10-CM | POA: Insufficient documentation

## 2018-07-15 DIAGNOSIS — Z85118 Personal history of other malignant neoplasm of bronchus and lung: Secondary | ICD-10-CM | POA: Insufficient documentation

## 2018-07-15 DIAGNOSIS — Z87891 Personal history of nicotine dependence: Secondary | ICD-10-CM | POA: Diagnosis not present

## 2018-07-15 DIAGNOSIS — Z79899 Other long term (current) drug therapy: Secondary | ICD-10-CM | POA: Insufficient documentation

## 2018-07-15 DIAGNOSIS — I739 Peripheral vascular disease, unspecified: Secondary | ICD-10-CM | POA: Insufficient documentation

## 2018-07-15 DIAGNOSIS — I4819 Other persistent atrial fibrillation: Secondary | ICD-10-CM

## 2018-07-15 DIAGNOSIS — I5032 Chronic diastolic (congestive) heart failure: Secondary | ICD-10-CM | POA: Diagnosis not present

## 2018-07-15 DIAGNOSIS — E0590 Thyrotoxicosis, unspecified without thyrotoxic crisis or storm: Secondary | ICD-10-CM | POA: Insufficient documentation

## 2018-07-15 DIAGNOSIS — C187 Malignant neoplasm of sigmoid colon: Secondary | ICD-10-CM | POA: Insufficient documentation

## 2018-07-15 DIAGNOSIS — J449 Chronic obstructive pulmonary disease, unspecified: Secondary | ICD-10-CM | POA: Diagnosis not present

## 2018-07-15 DIAGNOSIS — G4733 Obstructive sleep apnea (adult) (pediatric): Secondary | ICD-10-CM | POA: Diagnosis not present

## 2018-07-15 DIAGNOSIS — Z85038 Personal history of other malignant neoplasm of large intestine: Secondary | ICD-10-CM | POA: Insufficient documentation

## 2018-07-15 DIAGNOSIS — I130 Hypertensive heart and chronic kidney disease with heart failure and stage 1 through stage 4 chronic kidney disease, or unspecified chronic kidney disease: Secondary | ICD-10-CM | POA: Diagnosis present

## 2018-07-15 DIAGNOSIS — I480 Paroxysmal atrial fibrillation: Secondary | ICD-10-CM | POA: Diagnosis not present

## 2018-07-15 DIAGNOSIS — J90 Pleural effusion, not elsewhere classified: Secondary | ICD-10-CM | POA: Insufficient documentation

## 2018-07-15 LAB — BASIC METABOLIC PANEL
BUN: 22 mg/dL (ref 6–23)
CALCIUM: 9.2 mg/dL (ref 8.4–10.5)
CO2: 24 mEq/L (ref 19–32)
Chloride: 103 mEq/L (ref 96–112)
Creatinine, Ser: 1.39 mg/dL (ref 0.40–1.50)
GFR: 52.56 mL/min — AB (ref >60.00)
Glucose, Bld: 116 mg/dL — ABNORMAL HIGH (ref 70–99)
POTASSIUM: 4 meq/L (ref 3.5–5.1)
SODIUM: 137 meq/L (ref 135–145)

## 2018-07-15 LAB — TSH: TSH: 0.03 u[IU]/mL — ABNORMAL LOW (ref 0.35–4.50)

## 2018-07-15 LAB — BRAIN NATRIURETIC PEPTIDE: PRO B NATRI PEPTIDE: 279 pg/mL — AB (ref 0.0–100.0)

## 2018-07-15 MED ORDER — FUROSEMIDE 20 MG PO TABS: ORAL_TABLET | ORAL | 11 refills | Status: DC

## 2018-07-15 MED ORDER — METOPROLOL SUCCINATE ER 25 MG PO TB24: 25.0000 mg | ORAL_TABLET | Freq: Every day | ORAL | 3 refills | Status: DC

## 2018-07-15 NOTE — Assessment & Plan Note (Signed)
-  02/03/2015  Walked RA x 3 laps @ 185 ft each stopped due to min sob brisk pace, L hip pain, no desat  -  LHC  08/05/17  Nl CA, nl pressures  -  HRCT  08/13/17  1. No evidence of interstitial lung disease. 2. 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 Referred to rehab 09/02/2017  - 07/14/2018  Walked RA x 3 laps @ 185 ft each stopped due to  End of study, nl pace, no desat  - mild sob     Symptoms are disproportionate to objective findings and not clear to what extent this is all actually a pulmonary  problem but pt does appear to have difficult to sort out respiratory symptoms of unknown origin for which  DDX  = almost all start with A and  include Adherence, Ace Inhibitors, Acid Reflux, Active Sinus Disease, Alpha 1 Antitripsin deficiency, Anxiety masquerading as Airways dz,  ABPA,  Allergy(esp in young), Aspiration (esp in elderly), Adverse effects of meds,  Active smoking or Vaping, A bunch of PE's/clot burden (a few small clots can't cause this syndrome unless there is already severe underlying pulm or vascular dz with poor reserve),  Anemia or thyroid disorder, plus two Bs  = Bronchiectasis and Beta blocker use..and one C= CHF    Adherence is always the initial "prime suspect" and is a multilayered concern that requires a "trust but verify" approach in every patient - starting with knowing how to use medications, especially inhalers, correctly, keeping up with refills and understanding the fundamental difference between maintenance and prns vs those medications only taken for a very short course and then stopped and not refilled.  - see [facility]fa teaching - return  with all meds in hand using a trust but verify approach to confirm accurate Medication  Reconciliation The principal here is that until we are certain that the  patients are doing what we've asked, it makes no sense to ask them to do more.    ? Allergy/ asthma > continue symbicort 160  2bid/ continue singulair   ? Adverse effects of meds > esp concerned about amiodarone given high esr  - Patients typically have been on amiodarone for 6-12 months before this complication manifests.  Of note, serial clinical evaluation for symptoms such as cough dyspnea or fevers is  the preferred method of monitoring for pulmonary toxicity because a decrease in DLCO or lung volumes is a nonspecific for toxicity. Pathologically amiodarone pulmonary toxicity may appear as interstitial pneumonitis, eosinophilic pneumonia, organizing pneumonia, pulmonary fibrosis or less commonly as diffuse alveolar hemorrhage, pulmonary nodules or pleural effusions.  Risk factors for pulmonary toxicity include age greater than 60, daily dose greater than equal to 400 mg, a high cumulative dose, or pre-existing lung disease, so he has at least 2/4 risk factors - seeing cards 07/15/2018 so will let them decide re risk/benefit   Anemia excluded , Thryroid dz possible, esp on amiodarone, needs T3uptake, T4 and free T3 to complete the w/u   ? chf > seeing cards  07/15/2018              

## 2018-07-15 NOTE — Patient Instructions (Signed)
STOP Amiodarone START Toprol XL 25 mg, one tab daily  INCREASE Lasix to 40 mg daily for 3 days, then CHANGE to 40 mg daily alternating with 20 mg daily  Labs needed in 10 days   Your physician recommends that you schedule a follow-up appointment in: 6 weeks with Dr Aundra Dubin   Do the following things EVERYDAY: 1) Weigh yourself in the morning before breakfast. Write it down and keep it in a log. 2) Take your medicines as prescribed 3) Eat low salt foods-Limit salt (sodium) to 2000 mg per day.  4) Stay as active as you can everyday 5) Limit all fluids for the day to less than 2 liters

## 2018-07-15 NOTE — Progress Notes (Signed)
Spoke with the pt's spouse and notified of results/recs and she verbalized understanding.

## 2018-07-15 NOTE — Assessment & Plan Note (Addendum)
Quit smoking 07/2013 - PFT's  1/14//2018  FEV1 2.10 (62 % ) ratio 64  p 7 % improvement from saba p nothing prior to study with DLCO  40/40c % corrects to 59  % for alv volume  And typical curvature  - 08/08/2017  Walked RA x 3 laps @ 185 ft each stopped due to  End of study, nl pace, no  Desat/ min sob with legs stopping him from going further     - PFT's  08/13/17   FEV1 1.98 (59 % ) ratio 64  p nothing prior to study with DLCO  34 % corrects to 56 % for alv volume  > refused trial of saba "afraid of glaucoma effects"  - Referred to pulmonary rehab 09/02/2017 > completed end of sept 2019   - Spirometry 07/14/2018  FEV1 1.4 (40%)  Ratio 58 p am symbicort 160 x 2 with mod curvature  - 07/14/2018  After extensive coaching inhaler device,  effectiveness =   75%     rec continue symbicort 160 2bid as only other option is lama and he has h/o acute glaucoma

## 2018-07-15 NOTE — Progress Notes (Addendum)
This was a shared visit with [place]  [phone] [phone]  Clinic Follow up Note   Patient Care Team: Binnie Rail, MD as PCP - General (Internal Medicine) Thompson Grayer, MD as PCP - Electrophysiology (Cardiology) Everardo All, MD (Hematology and Oncology) Lerry Paterson, MD as Referring Physician (Thoracic Surgery) Larey Dresser, MD as Consulting Physician (Cardiology) Susa Day, MD as Consulting Physician (Orthopedic Surgery) 07/16/2018  SUMMARY OF ONCOLOGIC HISTORY:   Colon adenocarcinoma ([place])   06/25/2018 Procedure    Colonoscopy per Dr. Rush Landmark: - Preparation of the colon was fair. - The examined portion of the ileum was normal. - Three 5 to 8 mm polyps in the ascending colon, removed with a hot snare. Resected and retrieved. - Three 3 to 6 mm polyps in the descending colon, at the hepatic flexure and in the ascending colon, removed with a cold snare. Resected and retrieved. - Rule out malignancy, partially obstructing tumor in the sigmoid colon. Biopsied. Tattooed proximally. - Diverticulosis in the recto-sigmoid colon, in the sigmoid colon and in the descending colon. - Non-bleeding non-thrombosed internal hemorrhoids.    06/25/2018 Procedure    UPPER ENDOSCOPY IMPRESSION - No gross lesions in esophagus. - Salmon-colored mucosa suspicious for Barrett's esophagus. Biopsied to rule out Barrett's. - Z-line irregular, 46 cm from the incisors. - No gross lesions in the stomach. Biopsied for HP. - Erythematous duodenopathy in bulb. No other gross lesions in the second portion of the duodenum and in the third portion of the duodenum. Biopsied to rule out Celiac.     06/25/2018 Initial Biopsy    Diagnosis 1. Duodenum, Biopsy - BENIGN SMALL BOWEL MUCOSA. - NO ACTIVE INFLAMMATION OR VILLOUS ATROPHY IDENTIFIED. 2. Stomach, biopsy - CHRONIC GASTRITIS WITH GOBLET CELL METAPLASIA. - THERE IS NO EVIDENCE OF  HELICOBACTER PYLORI, DYSPLASIA OR MALIGNANCY. - SEE COMMENT. 3. Esophagus, biopsy, at 45 cm - GASTROESOPHAGEAL JUNCTION MUCOSA WITH MILD INFLAMMATION CONSISTENT WITH GASTROESOPHAGEAL REFLUX. - THERE IS NO EVIDENCE OF GOBLET CELL METAPLASIA, DYSPLASIA OR MALIGNANCY IDENTIFIED. 4. Colon, polyp(s), ascending, hepatic flexure and descending - TUBULAR ADENOMA(S). - HIGH GRADE DYSPLASIA IS NOT IDENTIFIED. 5. Colon, biopsy, sigmoid at 35-39 cm mass - ADENOCARCINOMA. - SEE COMMENT.    06/25/2018 Imaging    IMPRESSION: CT Chest: 1. Multiple very small peripheral nodules involving the BILATERAL UPPER lobes, none larger than 2-3 mm, a nonspecific finding. No dominant pulmonary nodules. 2. Enlarging SUPERIOR RIGHT paratracheal (station 2R) lymph node since November, 2018. Multiple normal sized lymph nodes are present elsewhere throughout the mediastinum which are stable. 3.  No acute cardiopulmonary disease. 4.  Emphysema. (ICD10-J43.9) 5. Postsurgical pleural and parenchymal scarring related to the prior RIGHT LOWER lobectomy. Stable chronic RIGHT pleural effusion. 6.  Aortic Atherosclerosis (ICD10-170.0)  CT Abdomen and Pelvis: 1. Mass involving the mid sigmoid colon which corresponds to the patient's known colon cancer. 2. No evidence of metastatic disease in the abdomen or pelvis. 3. 3.8 cm saccular infrarenal abdominal aortic aneurysm. (ICD10-I71.9). Recommend followup by ultrasound in 2 years. This recommendation follows ACR consensus guidelines: White Paper of the ACR Incidental Findings Committee II on Vascular Findings. J Am Coll Radiol 2013; 10:789-794. 4. Cholelithiasis without evidence of acute cholecystitis. 5. Hepatic steatosis. 6. Diverticulum arising medially from the descending duodenum. 7. Distal descending and proximal sigmoid colon diverticulosis without evidence of acute diverticulitis. 8. Moderate median lobe prostate gland enlargement. 9. Scarring involving the LOWER pole  the LEFT kidney. 10. Small BILATERAL inguinal hernias  containing fat.     06/30/2018 Initial Diagnosis    Adenocarcinoma of sigmoid colon [facility])     INTERVAL HISTORY: Troy [name] returns for follow up as scheduled; he presents with his son. He was last seen 9/30 in consult. He underwent FNA biopsy of paratracheal LN on 10/10. He tolerated the procedure well. He notes increased constipation after anesthesia, but he recovered. Takes stool softeners daily. Last BM this morning. Stools are dark on iron therapy BID; denies obvious bleeding. Denies abdominal pain. Appetite remains low without weight loss. He remains fatigued but functional. He saw Drs. Wert and Aundra Dubin this week. Dyspnea has improved some since starting symbicort, also on singulair. Amiodarone was discontinued and started toprol XL per cardiology. Dry cough is at baseline. Denies chest pain. No recent fever, chills.    MEDICAL HISTORY:  Past Medical History:  Diagnosis Date  . AAA (abdominal aortic aneurysm) ([place]) LAST ABDOMINAL US 10-20-17 3.3 CM   MONITORED BY DR [name]  . Arthritis   . Atrial fibrillation ([place])   . Bilateral carotid artery stenosis DUPLEX 12-29-2012  BY DR [facility]   BILATERAL ICA STENOSIS 60-79%  . BPH (benign prostatic hypertrophy)   . Cancer ([place]) 01/25/2012   LUNG  . Complication of anesthesia    had nausea after carotid artery surgery, states the medicine given to help the nausea, made it worse  . COPD (chronic obstructive pulmonary disease) ([place])   . GERD (gastroesophageal reflux disease)   . Glaucoma BOTH EYES   Dr Gershon Crane  . History of basal cell carcinoma excision   . History of lung cancer APRIL 2012  SQUAMOUS CELL---- S/P RIGHT LOWER LOBECTOMY AT [facility] --  NO CHEMORADIATION---  NO RECURRENCE    ONCOLOGIST- DR Tressie Stalker  LOV IN [facility] 10-27-2012  . Hx of adenomatous colonic polyps 2005    X 2; 1 hyperplastic polyp; Dr Olevia Perches  . Hyperlipidemia   . Hypertension   . Impaired fasting glucose 2007    108; A1c5.4%  . Lesion of bladder   . Microhematuria   . OSA (obstructive sleep apnea) 08/29/2015   CPAP SET ON 10  . PAD (peripheral artery disease) ([place]) ABI'S  JAN 2014  0.65 ON RIGHT ;  1.04 ON LEFT  . Peripheral vascular disease ([place]) S/P ANGIOPLASTY AND STENTING   FOLLOWED  BY DR [name]  . Spinal stenosis Sept. 2015  . Status post placement of implantable loop recorder   . Stroke [facility]) Jul 07, 2013   mini  TIA  . Urgency of urination     SURGICAL HISTORY: Past Surgical History:  Procedure Laterality Date  . ANGIO PLASTY     X 4 in legs  . AORTOGRAM  07-27-2002   MILD DIFFUSE ILIAC ARTERY OCCLUSIVE DISEASE /  LEFT RENAL ARTERY 20%/ PATENT LEFT FEM-POP GRAFT/ MILD SFA AND POPLITEAL ARTERY OCCLUSIVE DISEASE W/ SEVERE KIDNEY OCCLUSIVE DISEASE  . BASAL CELL CARCINOMA EXCISION     MULTIPLE TIMES--  RIGHT FOREARM, CHEEKS, AND BACK  . BIOPSY  06/25/2018   Procedure: BIOPSY;  Surgeon: Rush Landmark Telford Nab., MD;  Location: [place];  Service: Gastroenterology;;  . CARDIOVASCULAR STRESS TEST  12-08-2012  DR MCLEAN   NORMAL LEXISCAN WITH NO EXERCISE NUCLEAR STUDY/ EF 66%/   NO ISCHEMIA/ NO SIGNIFICANT CHANGE FROM PRIOR STUDY  . CARDIOVERSION N/A 02/14/2018   Procedure: CARDIOVERSION;  Surgeon: Larey Dresser, MD;  Location: [facility] ENDOSCOPY;  Service: Cardiovascular;  Laterality: N/A;  . CAROTID ANGIOGRAM N/A 07/10/2013   Procedure:  CAROTID ANGIOGRAM;  Surgeon: Elam Dutch, MD;  Location: [facility] CATH LAB;  Service: Cardiovascular;  Laterality: N/A;  . CAROTID ENDARTERECTOMY Right 07-14-13   cea  . CATARACT EXTRACTION W/ INTRAOCULAR LENS  IMPLANT, BILATERAL    . colon polyectomy    . COLONOSCOPY WITH PROPOFOL N/A 06/25/2018   Procedure: COLONOSCOPY WITH PROPOFOL;  Surgeon: Rush Landmark Telford Nab., MD;  Location: [place];  Service: Gastroenterology;  Laterality: N/A;  . CYSTOSCOPY W/ RETROGRADES Bilateral 01/21/2013   Procedure: CYSTOSCOPY WITH RETROGRADE PYELOGRAM;  Surgeon:  Molli Hazard, MD;  Location: [facility];  Service: Urology;  Laterality: Bilateral;   CYSTO, BLADDER BIOPSY, BILATERAL RETROGRADE PYELOGRAM  RAD TECH FROM RADIOLOGY PER JOY  . CYSTOSCOPY WITH BIOPSY N/A 01/21/2013   Procedure: CYSTOSCOPY WITH BIOPSY;  Surgeon: Molli Hazard, MD;  Location: [facility];  Service: Urology;  Laterality: N/A;  . ENDARTERECTOMY Right 07/14/2013   Procedure: ENDARTERECTOMY CAROTID;  Surgeon: Angelia Mould, MD;  Location: [place];  Service: Vascular;  Laterality: Right;  . EP IMPLANTABLE DEVICE N/A 08/12/2015   Procedure: Loop Recorder Insertion;  Surgeon: Thompson Grayer, MD;  Location: [place] CV LAB;  Service: Cardiovascular;  Laterality: N/A;  . ESOPHAGOGASTRODUODENOSCOPY (EGD) WITH PROPOFOL N/A 06/25/2018   Procedure: ESOPHAGOGASTRODUODENOSCOPY (EGD) WITH PROPOFOL;  Surgeon: Rush Landmark Telford Nab., MD;  Location: [place];  Service: Gastroenterology;  Laterality: N/A;  . ESOPHAGOGASTRODUODENOSCOPY (EGD) WITH PROPOFOL N/A 07/10/2018   Procedure: ESOPHAGOGASTRODUODENOSCOPY (EGD) WITH PROPOFOL;  Surgeon: Milus Banister, MD;  Location: [facility] ENDOSCOPY;  Service: Endoscopy;  Laterality: N/A;  . EUS N/A 07/10/2018   Procedure: UPPER ENDOSCOPIC ULTRASOUND (EUS) RADIAL;  Surgeon: Milus Banister, MD;  Location: [facility] ENDOSCOPY;  Service: Endoscopy;  Laterality: N/A;  . FEMORAL-POPLITEAL BYPASS GRAFT Left [facility]   AND 2001  IN [place]  . FEMORAL-POPLITEAL BYPASS GRAFT Left 08/30/2015   Procedure: REVISION OF BYPASS GRAFT Left  FEMORAL-POPLITEAL ARTERY;  Surgeon: Angelia Mould, MD;  Location: [place];  Service: Vascular;  Laterality: Left;  . FINE NEEDLE ASPIRATION N/A 07/10/2018   Procedure: FINE NEEDLE ASPIRATION (FNA) LINEAR;  Surgeon: Milus Banister, MD;  Location: [facility] ENDOSCOPY;  Service: Endoscopy;  Laterality: N/A;  . LARYNGOSCOPY  06-27-2004   BX VOCAL CORD  (LEUKOPLAKIA)  PER PT NO ISSUES  SINCE  . LEFT HEART CATH AND CORONARY ANGIOGRAPHY N/A 08/20/2017   Procedure: LEFT HEART CATH AND CORONARY ANGIOGRAPHY;  Surgeon: Larey Dresser, MD;  Location: [place] CV LAB;  Service: Cardiovascular;  Laterality: N/A;  . LOWER EXTREMITY ANGIOGRAM Bilateral 08/29/2015   Procedure: Lower Extremity Angiogram;  Surgeon: Angelia Mould, MD;  Location: [place] CV LAB;  Service: Cardiovascular;  Laterality: Bilateral;  . LUNG LOBECTOMY  01/24/2011    RIGHT UPPER LOBE  (SQUAMOUS CELL CARCINOMA) Dr Dorthea Cove , [facility]. No chemotherapyor radiation  . PATCH ANGIOPLASTY Right 07/14/2013   Procedure: PATCH ANGIOPLASTY;  Surgeon: Angelia Mould, MD;  Location: [place];  Service: Vascular;  Laterality: Right;  . PATCH ANGIOPLASTY Left 08/30/2015   Procedure: VEIN PATCH ANGIOPLASTY OF PROXIMAL Left BYPASS GRAFT;  Surgeon: Angelia Mould, MD;  Location: [place];  Service: Vascular;  Laterality: Left;  . PERIPHERAL VASCULAR CATHETERIZATION N/A 08/29/2015   Procedure: Abdominal Aortogram;  Surgeon: Angelia Mould, MD;  Location: [place] CV LAB;  Service: Cardiovascular;  Laterality: N/A;  . POLYPECTOMY  06/25/2018   Procedure: POLYPECTOMY;  Surgeon: Irving Copas., MD;  Location:  [facility] ENDOSCOPY;  Service: Gastroenterology;;  . Lia Foyer INJECTION  06/25/2018   Procedure: SUBMUCOSAL INJECTION;  Surgeon: Irving Copas., MD;  Location: [place];  Service: Gastroenterology;;  . TEE WITHOUT CARDIOVERSION N/A 02/14/2018   Procedure: TRANSESOPHAGEAL ECHOCARDIOGRAM (TEE);  Surgeon: Larey Dresser, MD;  Location: [facility] ENDOSCOPY;  Service: Cardiovascular;  Laterality: N/A;  . trabecular surgery     OS  . TRANSTHORACIC ECHOCARDIOGRAM  12-29-2012  DR [facility]   MILD LVH/  LVSF NORMAL/ EF 67-12%/  GRADE I DIASTOLIC DYSFUNCTION    I have reviewed the social history and family history with the patient and they are unchanged from previous note.  ALLERGIES:  is allergic to  niacin-lovastatin er; penicillins; sulfonamide derivatives; and atorvastatin.  MEDICATIONS:  Current Outpatient Medications  Medication Sig Dispense Refill  . acetaminophen (TYLENOL) 325 MG tablet Take 650 mg every 6 (six) hours as needed by mouth for moderate pain or headache.    . bimatoprost (LUMIGAN) 0.01 % SOLN Place 1 drop at bedtime into both eyes.     . Biotin 10000 MCG TABS Take 10,000 mcg daily by mouth.     . budesonide-formoterol (SYMBICORT) 80-4.5 MCG/ACT inhaler Inhale 2 puffs into the lungs 2 (two) times daily.    [name] docusate sodium (COLACE) 100 MG capsule Take 100 mg by mouth 2 (two) times daily.    . dorzolamide-timolol (COSOPT) 22.3-6.8 MG/ML ophthalmic solution Place 2 drops into both eyes 2 (two) times daily.     . ferrous sulfate 325 (65 FE) MG tablet Take 325 mg by mouth 2 (two) times daily with a meal.    . FLUoxetine (PROZAC) 20 MG capsule TAKE (1) CAPSULE DAILY. 90 capsule 1  . furosemide (LASIX) 20 MG tablet Take 40 mg daily for 3 days, then 40 mg daily alternating with 20 mg daily 60 tablet 11  . Magnesium 500 MG CAPS Take 500 mg by mouth at bedtime.    . Melatonin 5 MG TABS Take 5 mg by mouth at bedtime.    . metoprolol succinate (TOPROL-XL) 25 MG 24 hr tablet Take 1 tablet (25 mg total) by mouth daily. Take with or immediately following a meal. 90 tablet 3  . montelukast (SINGULAIR) 10 MG tablet Take 1 tablet (10 mg total) by mouth at bedtime. 30 tablet 5  . Multiple Vitamins-Minerals (CENTRUM SILVER 50+MEN) TABS Take 1 tablet by mouth at bedtime.    [name] omeprazole (PRILOSEC) 20 MG capsule Take 1 capsule (20 mg total) by mouth daily. 90 capsule 3  . Polyethyl Glycol-Propyl Glycol (SYSTANE OP) Place 1-2 drops as needed into both eyes (for dry eyes).    . potassium chloride SA (K-DUR,KLOR-CON) 20 MEQ tablet TAKE 1 TABLET ONCE DAILY. 30 tablet 3  . rosuvastatin (CRESTOR) 40 MG tablet TAKE 1 TABLET ONCE DAILY. 30 tablet 2   No current facility-administered medications  for this visit.     PHYSICAL EXAMINATION: ECOG PERFORMANCE STATUS: 1 - Symptomatic but completely ambulatory  Vitals:   07/16/18 1043  BP: (!) 142/62  Pulse: 64  Resp: 17  Temp: 98.1 F (36.7 C)  SpO2: 93%   Filed Weights   07/16/18 1043  Weight: 207 lb (93.9 kg)    GENERAL:alert, no distress and comfortable SKIN:  no rashes or significant lesions EYES:  sclera clear OROPHARYNX:no thrush or ulcers LYMPH:  no palpable cervical or supraclavicular lymphadenopathy  LUNGS: distant breath sounds, normal breathing effort HEART: irregular rhythm, no lower extremity edema ABDOMEN:abdomen soft, non-tender and normal bowel  sounds Musculoskeletal:no cyanosis of digits and no clubbing  NEURO: alert & oriented x 3 with fluent speech, no focal motor deficits  LABORATORY DATA:  I have reviewed the data as listed CBC Latest Ref Rng & Units 07/14/2018 06/26/2018 06/25/2018  WBC 4.0 - 10.5 K/uL 4.7 6.9 4.8  Hemoglobin 13.0 - 17.0 g/dL 10.7(L) 9.5(L) 10.1(L)  Hematocrit 39.0 - 52.0 % 33.5(L) 29.8(L) 32.5(L)  Platelets 150.0 - 400.0 K/uL 263.0 228 244     CMP Latest Ref Rng & Units 07/14/2018 06/26/2018 06/24/2018  Glucose 70 - 99 mg/dL 116(H) 146(H) 121(H)  BUN 6 - 23 mg/dL 22 14 14   Creatinine 0.40 - 1.50 mg/dL 1.39 1.25(H) 1.28(H)  Sodium 135 - 145 mEq/L 137 140 141  Potassium 3.5 - 5.1 mEq/L 4.0 3.8 4.0  Chloride 96 - 112 mEq/L 103 111 107  CO2 19 - 32 mEq/L 24 23 24   Calcium 8.4 - 10.5 mg/dL 9.2 8.4(L) 8.9  Total Protein 6.5 - 8.1 g/dL - - -  Total Bilirubin 0.3 - 1.2 mg/dL - - -  Alkaline Phos 38 - 126 U/L - - -  AST 15 - 41 U/L - - -  ALT 0 - 44 U/L - - -   PATHOLOGY   Initial biopsy 06/25/18 Diagnosis 1. Duodenum, Biopsy - BENIGN SMALL BOWEL MUCOSA. - NO ACTIVE INFLAMMATION OR VILLOUS ATROPHY IDENTIFIED. 2. Stomach, biopsy - CHRONIC GASTRITIS WITH GOBLET CELL METAPLASIA. - THERE IS NO EVIDENCE OF HELICOBACTER PYLORI, DYSPLASIA OR MALIGNANCY. - SEE COMMENT. 3.  Esophagus, biopsy, at 45 cm - GASTROESOPHAGEAL JUNCTION MUCOSA WITH MILD INFLAMMATION CONSISTENT WITH GASTROESOPHAGEAL REFLUX. - THERE IS NO EVIDENCE OF GOBLET CELL METAPLASIA, DYSPLASIA OR MALIGNANCY IDENTIFIED. 4. Colon, polyp(s), ascending, hepatic flexure and descending - TUBULAR ADENOMA(S). - HIGH GRADE DYSPLASIA IS NOT IDENTIFIED. 5. Colon, biopsy, sigmoid at 35-39 cm mass - ADENOCARCINOMA. - SEE COMMENT.  Diagnosis 07/10/18 FINE NEEDLE ASPIRATION, ENDOSCOPIC PARATRACHEAL LYMPH NODE (SPECIMEN I OF 1 COLLECTED 07/10/18) NO MALIGNANT CELLS IDENTIFIED. Preliminary Diagnosis Intraoperative Diagnosis: NO DEFINITIVE MALIGNANT CELL IDENTIFIED. (DLB)   PROCEDURES  Colonoscopy per Dr. Rush Landmark: 06/25/18 A frond-like/villous, fungating, polypoid and ulcerated partially obstructing medium-sized mass was found in the sigmoid colon. The mass was partially circumferential (involving one-third of the lumen circumference). The mass measured four cm in length. Oozing was present. IMPRESSION - Preparation of the colon was fair. - The examined portion of the ileum was normal. - Three 5 to 8 mm polyps in the ascending colon, removed with a hot snare. Resected and retrieved. - Three 3 to 6 mm polyps in the descending colon, at the hepatic flexure and in the ascending colon, removed with a cold snare. Resected and retrieved. - Rule out malignancy, partially obstructing tumor in the sigmoid colon. Biopsied. Tattooed proximally. - Diverticulosis in the recto-sigmoid colon, in the sigmoid colon and in the descending colon. - Non-bleeding non-thrombosed internal hemorrhoids.  UPPER ENDOSCOPY 06/25/18  - No gross lesions in esophagus. - Salmon-colored mucosa suspicious for Barrett's esophagus. Biopsied to rule out Barrett's. - Z-line irregular, 46 cm from the incisors. - No gross lesions in the stomach. Biopsied for HP. - Erythematous duodenopathy in bulb. No other gross lesions in the second  portion of the duodenum and in the third portion of the duodenum. Biopsied to rule out Celiac.     RADIOGRAPHIC STUDIES: I have personally reviewed the radiological images as listed and agreed with the findings in the report. Dg Chest 2 View  Result Date: 07/15/2018 CLINICAL DATA:  Follow-up pneumothorax. Persistent wheezing and dyspnea. History of right lower lobectomy for malignancy, atrial fibrillation, former smoker. EXAM: CHEST - 2 VIEW COMPARISON:  PA and lateral chest x-ray of July 10, 2018 FINDINGS: There is no residual pneumothorax. There is persistent volume loss on the right with pleural fluid in the apex and at the lung base as well as some pleural thickening. The interstitial markings of both lungs remain increased but have improved slightly since the previous study. The heart is normal in size. A implantable Roux loop recorder is present and stable. There is faint calcification in the wall of the aortic arch. IMPRESSION: No residual pneumothorax. Persistent moderate-sized right pleural effusion associated with volume loss of the right lung. Mild compensatory hyperinflation of the left lung. Decreased pulmonary interstitial markings may reflect decreased interstitial edema. No alveolar pneumonia. Thoracic aortic atherosclerosis. Electronically Signed   By: David  Martinique M.D.   On: 07/15/2018 08:23     ASSESSMENT & PLAN: 77 year old caucasian male presenting with symptomatic anemia found to have sigmoid colon mass and enlarging right paratracheal lymph node   1. Adenocarcinoma of sigmoid colon  Troy [name] is doing well. He was seen with Dr. Benay Spice today. We reviewed pathology of paratracheal node which is negative for malignancy. He has been referred and will see Dr. Dema Severin on 10/17 to discuss the surgical plan. The patient has a trip planned from 11/5-11/12, will likely undergo resection soon after that. We again discussed colon cancer staging is finalized after surgical  resection. We will see him back 1.5 - 2 weeks after surgery to review the final pathology and discuss any adjuvant therapy he may need. We will watch the enlarged paratracheal node on future imaging, likely CT or possibly PET.   2. Iron deficiency anemia Secondary to bleeding from tumor, exacerbated by anticoagulation with eliquis which is currently on hold. He presented with symptomatic iron deficiency anemia, most recent Hgb 9.5 on 9/26. Hgb improved slightly, 10.7; he will continue oral iron therapy   3. 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 Elenor Quinones at [facility]. Last Ct chest 05/01/18 stable, without concerning evidence of recurrence   4. COPD, Afib Followed by Dr. Melvyn Novas on 10/15, on symbicort and singulair.  Followed by [name] on 10/15. Stopped amio and started toprol XL. Off eliquis pending colon surgery.  All questions were answered. The patient knows to call the clinic with any problems, questions or concerns. No barriers to learning was detected.     Alla Feeling, NP 07/16/18  This was a shared visit with Cira Rue.  The EUS biopsy of the chest lymph node is negative for malignancy.  Troy [name] understands the lymph node could still be involved with a malignancy.  We will follow this with a repeat CT in 3-4 months.  He will see Dr. Dema Severin this week to plan surgery for the colon cancer.  We will see him after surgery to discuss the pathology result and recommend adjuvant therapy.  Julieanne Manson, MD

## 2018-07-15 NOTE — Assessment & Plan Note (Signed)
s/p RLLobectomy 2012 at [place] is unchanged at this point  NB: Note that usually pleural effusion and copd have the same effect on insp muscles/mechanics (both shorten their length prior to inspiration making them weaker with less force reserve) so they are synergistic in causing sob.  However, his is an ex vacuo issue from RLLobectomy and note tracha pulled to side of effusion so overall effect on chest wall volumes is neg, not positive, so don't feel this is contributing to his symptoms and is unchanged from priors when symptoms were better so is not likely the culprit now    I had an extended discussion with the patient/wife reviewing all relevant studies completed to date and  lasting 25 minutes of a 40  minute acute office  visit addressing  severe non-specific but potentially very serious refractory respiratory symptoms of uncertain and potentially multiple  etiologies.   Each maintenance medication was reviewed in detail including most importantly the difference between maintenance and prns and under what circumstances the prns are to be triggered using an action plan format that is not reflected in the computer generated alphabetically organized AVS.    Please see AVS for specific instructions unique to this office visit that I personally wrote and verbalized to the the pt in detail and then reviewed with pt  by my nurse highlighting any changes in therapy/plan of care  recommended at today's visit.    See device teaching which extended face to face time for this visit

## 2018-07-15 NOTE — Telephone Encounter (Signed)
Patient's wife called stating patient had lab work done yesterday at [employer], [place] office. Patient's wife would like to cancel the lab appointment for tomorrow. Per Regan Rakers, NP okay to cancel tomorrow lab appt.  Made patient's wife aware, understanding verbalized.

## 2018-07-16 ENCOUNTER — Inpatient Hospital Stay: Payer: Commercial Managed Care - PPO

## 2018-07-16 ENCOUNTER — Inpatient Hospital Stay: Payer: Commercial Managed Care - PPO | Attending: Nurse Practitioner | Admitting: Nurse Practitioner

## 2018-07-16 ENCOUNTER — Encounter: Payer: Self-pay | Admitting: Nurse Practitioner

## 2018-07-16 VITALS — BP 142/62 | HR 64 | Temp 98.1°F | Resp 17 | Ht 73.0 in | Wt 207.0 lb

## 2018-07-16 DIAGNOSIS — D509 Iron deficiency anemia, unspecified: Secondary | ICD-10-CM

## 2018-07-16 DIAGNOSIS — C187 Malignant neoplasm of sigmoid colon: Secondary | ICD-10-CM

## 2018-07-16 DIAGNOSIS — Z85118 Personal history of other malignant neoplasm of bronchus and lung: Secondary | ICD-10-CM | POA: Diagnosis not present

## 2018-07-16 DIAGNOSIS — Z87891 Personal history of nicotine dependence: Secondary | ICD-10-CM | POA: Insufficient documentation

## 2018-07-16 DIAGNOSIS — I4891 Unspecified atrial fibrillation: Secondary | ICD-10-CM

## 2018-07-16 DIAGNOSIS — Z79899 Other long term (current) drug therapy: Secondary | ICD-10-CM | POA: Insufficient documentation

## 2018-07-16 DIAGNOSIS — J449 Chronic obstructive pulmonary disease, unspecified: Secondary | ICD-10-CM

## 2018-07-16 DIAGNOSIS — R599 Enlarged lymph nodes, unspecified: Secondary | ICD-10-CM | POA: Diagnosis not present

## 2018-07-16 LAB — RESPIRATORY ALLERGY PROFILE REGION II ~~LOC~~
Allergen, A. alternata, m6: <0.1 kU/L
Allergen, Cedar tree, t12: <0.1 kU/L
Allergen, Comm Silver Birch, t9: <0.1 kU/L
Allergen, Cottonwood, t14: <0.1 kU/L
Allergen, D pternoyssinus,d7: <0.1 kU/L
Allergen, Mouse Urine Protein, e78: <0.1 kU/L
Allergen, P. notatum, m1: <0.1 kU/L
Aspergillus fumigatus, m3: <0.1 kU/L
Bermuda Grass: <0.1 kU/L
Box Elder IgE: <0.1 kU/L
CLASS: 0
CLASS: 0
CLASS: 0
CLASS: 0
CLASS: 0
CLASS: 0
CLASS: 0
CLASS: 0
CLASS: 0
CLASS: 0
CLASS: 0
CLASS: 0
CLASS: 0
CLASS: 0
Cat Dander: <0.1 kU/L
Class: 0
Class: 0
Class: 0
Class: 0
Class: 0
Class: 0
Class: 0
Class: 0
Class: 0
Class: 0
D. farinae: <0.1 kU/L
Dog Dander: <0.1 kU/L
Elm IgE: <0.1 kU/L
IGE (IMMUNOGLOBULIN E), SERUM: 19 kU/L (ref <=114)
Johnson Grass: <0.1 kU/L
Pecan/Hickory Tree IgE: <0.1 kU/L

## 2018-07-16 LAB — INTERPRETATION:

## 2018-07-16 NOTE — Progress Notes (Signed)
Patient ID: Troy Kenner [name], male   DOB: 02-19-1941, 77 y.o.   MRN: [id number] PCP: Dr. Quay Burow Cardiology: [name]  77 y.o. with history of HTN, COPD, active smoking/COPD, carotid stenosis s/p right CEA, paroxysmal atrial fibrillation, and PAD returns for followup of CHF and atrial fibrillation.  He does not have known obstructive CAD but is at high risk for CAD based on his comorbidities.  Lexiscan Cardiolite in 3/14 showed no ischemia or infarction and echo in 3/14 showed normal EF.  He had a left fem-pop bypass at the [facility] in the '90s.  He is followed at [facility] for PAD. He has chronic right calf/thigh/buttocks claudication that is unchanged over the last few years and follows regularly at [facility].  He has had right lower lobectomy for lung cancer.  He continues to stay off cigarettes.    At a prior appointment, he was in atrial fibrillation but did not realize it.  I started him on Eliquis and diltiazem CD, and he spontaneously converted to NSR.  In 5/15, he was at [facility] and felt "strange" one day: fatigued, weak, short of breath.  He went to the ER and was in atrial fibrillation with HR in 80s-90s.  He spontaneously converted to NSR in the ER.  He felt back to normal after converting to NSR.  I started him on Multaq 400 mg bid.  With CHF, he was eventually transitioned over to amiodarone.   He had patch angioplasty revision of left fem-pop bypass in 11/16.  Now with minimal claudication.  He follows with [facility].     He had a Cardiolite and echo in 7/17 that were unremarkable.  Repeat Cardiolite in 12/17 showed no ischemia, echo was uninterpretable.  Cardiac MRI was therefore done in 1/18, showing EF 66% with normal-appearing RV, no late gadolinium enhancement.   Given increased exertional dyspnea and a defect on Cardiolite, he had LHC in 11/18.  This showed no obstructive CAD. He had a chest CT done given concern for possible amiodarone lung toxicity with increased dyspnea. This showed emphysema,  no ILD.    In 5/19, he developed a probable viral syndrome with dehydration and had a presyncopal episode.  He was orthostatic in the hospital.  He was noted to be in atrial fibrillation this admission which remained persistent.  He had a TEE-guided DCCV back to NSR.   In 9/19, he was admitted with GI bleeding and found to have a sigmoid mass on colonoscopy, path showed sigmoid adenocarcinoma.  He was noted to have right paratracheal lymphadenopathy but EUS with biopsy in 10/19 did not show malignancy.  Eliquis was stopped with bleeding sigmoid mass.  He was noted to be back in atrial fibrillation this admission.   He returns for followup of diastolic CHF and atrial fibrillation.  Weight is stable.  He is more short of breath with exertion.  Notes dyspnea with stairs, not able to walk as far before getting short of breath.  No orthopnea/PND.  No chest pain. Stable mild claudication.  He saw Dr. Melvyn Novas who raised the possibility of amiodarone lung toxicity.     ECG (personally reviewed):atrial fibrillation rate 78  Labs (3/13): LDL 75, HDL 35, K 4.1, creatinine 1.1 Labs (3/14): LDL 91, HDL 27 Labs (10/14): K 4.3, creatinine 0.98 Labs (4/15): K 4.9, creatinine 1.1, LDL 76, HDL 30 Labs (5/15): K 4.3, creatinine 1.7, BNP 251 Labs (6/15): K 4.4, creatinine 1.3 Labs (10/15): TSH normal Labs (5/16): K 4.2, creatinine 1.12, HCT  39.5, LFTs normal, LDL 84, HDL 43, TSH normal Labs (11/16): creatinine 0.94 Labs (4/17): LDL 39, HDL 39 Labs (6/17): K 4.5, creatinine 1.2 Labs (9/17): K 4, creatinine 1.25, HCT 38.7 Labs (12/17): K 4.5, creatinine 1.49 => 1.62, BNP 43 Labs (2/18): K 3.8, creatinine 1.33, LFTs normal, TSH normal Labs (6/18): K 4.3, creatinine 1.49, LFTs normal, TSH normal, hgb 12.3 Labs (11/18): ESR 60, TSH normal, LFTs normal, creatinine 1.34 Labs (1/19): LDL 50 Labs (5/19): K 4.6, creatinine 1.55, LFTs normal, hgb 12.7, LFTs normal Labs (10/19): K 4, creatinine 1.39, BNP 279, hgb 10.7, TSH  low, ESR 108  PMH: 1. HTN: Fatigue and cough with ramipril use.  2. COPD: Quit smoking 2014. PFTs (1/18) with moderately severe COPD.  - CT chest (11/18): Emphysema noted, no ILD.  3. AAA: CT 1/13 with 3.0 cm AAA.  Abdominal US (1/14) with 3.4 cm AAA. Abdominal US (4/15) with 3.25 x 3.27 AAA. Abdominal US (3/16) with 3.7 cm AAA.  Followed at [facility].  - Abd [name] (1/19): 3.3 cm AAA. 4. Squamous cell lung cancer diagnosed 2/12.  Had right lower lobectomy in 4/12.  5. Hyperlipidemia 6. PAD: Left fem-pop bypass 1994.  ABIs (2/12) 0.62 on right, 0.95 on left. ABIs (1/14): 0.65 on right, 1.04 on left. ABIs (4/15) 0.66 right, 0.87 left.  ABIs (3/16) 0.6 right, 0.88 left.  Patch angioplasty left fem-pop bypass in 11/16.  - Left fem-pop bypass patent on doppler evaluation in 12/17.  7. Stress myoview 2004 was normal. Lexiscan Cardiolite (3/14) with EF 66%, no ischemia or infarction. Lexiscan Cardiolite (7/17) with EF 61%, no ischemia/infarction.  - Cardiolite (12/17) with EF 62%, inferior/inferolateral fixed defect, most likely diaphragmatic attenuation, no ischemia.  - Cardiolite (9/18) with EF > 65%, fixed inferior defect (attenuation versus infarction), no ischemia.  - LHC (11/18): No obstructive CAD.  8. Chronic diastolic CHF: Echo (0/96): technically difficult with EF 55-60%, upper normal RV size.  Echo (5/16) with EF 60-65%.  Echo (7/17) with EF 55-60%, normal RV size and systolic function.  - Echo 12/17 with very poor windows, unable to comment on LV or RV function.  - Cardiac MRI (1/18) with EF 66%, normal RV size and systolic function.  - TEE (5/19): EF 55-60%, normal RV size and systolic function.  9. Carotid stenosis: TIA 10/14.  Carotid dopplers with > 28% RICA, 36-62% LICA.  Patient had right CEA in 10/14. Carotids (4/15) patent right CEA, LICA 94-76% stenosis.  Carotids (54/65) < 03% LICA, patent right CEA.  - Carotid dopplers (12/17): right CEA ok, < 54% LICA stenosis.  - Carotid dopplers  (1/19): Right CEA ok, 6-56% LICA stenosis.  10. Atrial fibrillation: Paroxysmal. First noted after lobectomy in 4/12 (brief), recurrence in 4/15 then in 5/15.  - TEE-guided DCCV for persistent atrial fibrillation in 5/19.  11. Spinal stenosis 12. OSA: Using CPAP.  13. Glaucoma 14. Has ILR 15. Colon cancer: Sigmoid adenocarcinoma  SH: Married, lives in [place].  Former [employer].  Quit smoking in 2014.  Rare ETOH now.   FH: No premature CAD  ROS: All systems reviewed and negative except as per HPI.   Current Outpatient Medications  Medication Sig Dispense Refill  . acetaminophen (TYLENOL) 325 MG tablet Take 650 mg every 6 (six) hours as needed by mouth for moderate pain or headache.    . bimatoprost (LUMIGAN) 0.01 % SOLN Place 1 drop at bedtime into both eyes.     . Biotin 10000 MCG TABS  Take 10,000 mcg daily by mouth.     . dorzolamide-timolol (COSOPT) 22.3-6.8 MG/ML ophthalmic solution Place 2 drops into both eyes 2 (two) times daily.     . ferrous sulfate 325 (65 FE) MG tablet Take 325 mg by mouth 2 (two) times daily with a meal.    . FLUoxetine (PROZAC) 20 MG capsule TAKE (1) CAPSULE DAILY. 90 capsule 1  . furosemide (LASIX) 20 MG tablet Take 40 mg daily for 3 days, then 40 mg daily alternating with 20 mg daily 60 tablet 11  . Magnesium 500 MG CAPS Take 500 mg by mouth at bedtime.    . Melatonin 5 MG TABS Take 5 mg by mouth at bedtime.    . montelukast (SINGULAIR) 10 MG tablet Take 1 tablet (10 mg total) by mouth at bedtime. 30 tablet 5  . Multiple Vitamins-Minerals (CENTRUM SILVER 50+MEN) TABS Take 1 tablet by mouth at bedtime.    [name] omeprazole (PRILOSEC) 20 MG capsule Take 1 capsule (20 mg total) by mouth daily. 90 capsule 3  . Polyethyl Glycol-Propyl Glycol (SYSTANE OP) Place 1-2 drops as needed into both eyes (for dry eyes).    . potassium chloride SA (K-DUR,KLOR-CON) 20 MEQ tablet TAKE 1 TABLET ONCE DAILY. 30 tablet 3  . rosuvastatin (CRESTOR) 40 MG tablet  TAKE 1 TABLET ONCE DAILY. 30 tablet 2  . metoprolol succinate (TOPROL-XL) 25 MG 24 hr tablet Take 1 tablet (25 mg total) by mouth daily. Take with or immediately following a meal. 90 tablet 3   No current facility-administered medications for this encounter.     BP 120/68   Pulse 66   Wt 92.8 kg (204 lb 8 oz)   SpO2 95%   BMI 26.98 kg/m  General: NAD Neck: JVP 8-9 cm with HJR, no thyromegaly or thyroid nodule.  Lungs: Decreased breath sounds on right.  CV: Nondisplaced PMI.  Heart regular S1/S2, no S3/S4, no murmur.  1+ L>R ankle edema.  No carotid bruit.  Normal pedal pulses.  Abdomen: Soft, nontender, no hepatosplenomegaly, no distention.  Skin: Intact without lesions or rashes.  Neurologic: Alert and oriented x 3.  Psych: Normal affect. Extremities: No clubbing or cyanosis.  HEENT: Normal.   Assessment/Plan: 1. Chronic diastolic CHF: TEE in 7/67 showed LV EF 55-60% with normal RV size and systolic function.  He looks more volume overloaded on exam today in setting of persistent atrial fibrillation, NYHA class III symptoms.  - Increase Lasix to 40 mg daily x 3 days then alternate 40 mg daily with 20 mg daily.  BMET 1 week.  2. Hyperlipidemia: Continue Crestor.  Good lipids in 1/19.  3. Carotid stenosis: s/p R CEA.  Dopplers followed at [facility], stable when done recently.  4. PAD: s/p patch angioplasty to left fem-pop bypass in 11/16.  Followed at [facility]. He is off cilostazol with CHF but his claudication has actually improved over time with increased exercise.   5. AAA: Stable on last [name], followed at [facility].    6. COPD: No longer smoking. PFTs in 1/18 showed moderately severe obstructive defect. CT chest in 11/18 showed emphysema.  He is not on inhalers due to glaucoma.   - He follows with pulmonary. 7. Atrial fibrillation: He has symptomatic atrial fibrillation, suspect this correlates with his recent development of volume overload.  He is back in atrial fibrillation for the last month or so  despite amiodarone use.  He is off anticoagulation due to bleeding sigmoid adenocarcinoma. He will not be able  to restart until he has had the sigmoid mass removed, therefore, I cannot cardiovert him now.  Additionally, TSH was low when checked in 10/19 concerning for amiodarone-related hyperthyroidism, and Dr. Melvyn Novas apparently raised concern with him for amiodarone lung toxicity.  - I will stop amiodarone at this point.  He will start on Toprol XL 25 mg daily.  - He is off Eliquis for reasons outlined above, restart after cancer is resected.  - Continue to limit ETOH.  - Continue to use CPAP.  8. CKD: Stage 3. Stable BMET recently.   9. CAD: LHC in 11/18 with nonobstructive disease only.   10. Hyperthyroidism: TSH low on most recent labs.  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  I am stopping amiodarone.  - With repeat labs next week, resend TSH and check free T3 and free T4.  11. Colon cancer: Sigmoid adenocarcinoma. Will be seeing GI surgeon soon.  12. Dyspnea: Suspect multifactorial with anemia, diastolic CHF, persistent atrial fibrillation.  Cannot rule out amiodarone lung toxicity.    Followup in 6 wks.    [name] 07/16/2018

## 2018-07-17 ENCOUNTER — Telehealth: Payer: Self-pay | Admitting: Oncology

## 2018-07-17 NOTE — Telephone Encounter (Signed)
Appts scheduled and patient was notified per 10/16 los

## 2018-07-17 NOTE — Progress Notes (Signed)
Left detailed msg on machine ok per DPR

## 2018-07-20 ENCOUNTER — Other Ambulatory Visit: Payer: Self-pay | Admitting: Pulmonary Disease

## 2018-07-21 ENCOUNTER — Telehealth: Payer: Self-pay | Admitting: *Deleted

## 2018-07-21 ENCOUNTER — Telehealth: Payer: Self-pay | Admitting: Cardiovascular Disease

## 2018-07-21 ENCOUNTER — Ambulatory Visit (INDEPENDENT_AMBULATORY_CARE_PROVIDER_SITE_OTHER): Payer: Commercial Managed Care - PPO | Admitting: *Deleted

## 2018-07-21 DIAGNOSIS — I480 Paroxysmal atrial fibrillation: Secondary | ICD-10-CM

## 2018-07-21 NOTE — Telephone Encounter (Signed)
Wrong Provider

## 2018-07-21 NOTE — Telephone Encounter (Signed)
Patient was last seen 01/19/16 and was suppose to return in one year but never did. Patient now needs a written prescription to carry his cpap on board the plane.

## 2018-07-22 ENCOUNTER — Encounter: Payer: Self-pay | Admitting: *Deleted

## 2018-07-22 ENCOUNTER — Telehealth: Payer: Self-pay | Admitting: *Deleted

## 2018-07-22 NOTE — Telephone Encounter (Signed)
From Dr Radford Pax for patient to travel RE: [facility]  Sueanne Margarita, MD  Freada Bergeron, [place]        Patient has mild OSA with an AHI of 6/hr and oxygen desaturations as low as 84% with moderate snoring. He is on CPAP at 10cm H2O and needs to carry his device with him on trips to use for his obstructive sleep apnea.   Fransico Him, MD

## 2018-07-22 NOTE — Telephone Encounter (Signed)
ATC pt, NA and his VM box is full

## 2018-07-22 NOTE — Progress Notes (Signed)
Carelink Summary Report / Loop Recorder 

## 2018-07-22 NOTE — Telephone Encounter (Signed)
-----   Message from Tanda Rockers, MD sent at 07/21/2018 11:28 AM EDT ----- Needs ov to clear for surgery in next 2 weeks

## 2018-07-23 ENCOUNTER — Telehealth (HOSPITAL_COMMUNITY): Payer: Self-pay

## 2018-07-23 ENCOUNTER — Telehealth: Payer: Self-pay | Admitting: Oncology

## 2018-07-23 ENCOUNTER — Ambulatory Visit (HOSPITAL_COMMUNITY)
Admission: RE | Admit: 2018-07-23 | Discharge: 2018-07-23 | Disposition: A | Discharge status: Home / Self Care | Payer: Commercial Managed Care - PPO | Source: Ambulatory Visit | Attending: Internal Medicine | Admitting: Internal Medicine

## 2018-07-23 DIAGNOSIS — I5032 Chronic diastolic (congestive) heart failure: Secondary | ICD-10-CM | POA: Diagnosis present

## 2018-07-23 LAB — BASIC METABOLIC PANEL
Anion gap: 5 (ref 5–15)
BUN: 19 mg/dL (ref 8–23)
CHLORIDE: 111 mmol/L (ref 98–111)
CO2: 24 mmol/L (ref 22–32)
CREATININE: 1.43 mg/dL — AB (ref 0.61–1.24)
Calcium: 9.2 mg/dL (ref 8.9–10.3)
GFR, EST AFRICAN AMERICAN: 53 mL/min — AB (ref >60)
GFR, EST NON AFRICAN AMERICAN: 46 mL/min — AB (ref >60)
Glucose, Bld: 103 mg/dL — ABNORMAL HIGH (ref 70–99)
POTASSIUM: 4.1 mmol/L (ref 3.5–5.1)
SODIUM: 140 mmol/L (ref 135–145)

## 2018-07-23 LAB — TSH: TSH: 0.01 u[IU]/mL — AB (ref 0.350–4.500)

## 2018-07-23 LAB — T4, FREE: FREE T4: 2.94 ng/dL — AB (ref 0.82–1.77)

## 2018-07-23 NOTE — Telephone Encounter (Signed)
Patient wife rescheduled and request a print out mailed

## 2018-07-23 NOTE — Telephone Encounter (Signed)
Spoke with the pt and scheduled ov with MW for 07/29/18

## 2018-07-23 NOTE — Telephone Encounter (Signed)
Pt surgical clearance was faxed

## 2018-07-24 ENCOUNTER — Telehealth: Payer: Self-pay | Admitting: Internal Medicine

## 2018-07-24 ENCOUNTER — Telehealth (HOSPITAL_COMMUNITY): Payer: Self-pay | Admitting: Cardiology

## 2018-07-24 DIAGNOSIS — E0590 Thyrotoxicosis, unspecified without thyrotoxic crisis or storm: Secondary | ICD-10-CM

## 2018-07-24 LAB — T3, FREE: T3 FREE: 4.7 pg/mL — AB (ref 2.0–4.4)

## 2018-07-24 NOTE — Telephone Encounter (Signed)
Reviewed and agree with Dr Claris Gladden recs for eval of hyperthyroidism and will go over this in more detail at f/u ov which he should still keep

## 2018-07-24 NOTE — Telephone Encounter (Signed)
-----   Message from Larey Dresser, MD sent at 07/23/2018  8:49 PM EDT ----- Labs consistent with hyperthyroidism.  He is now off amiodarone.  Needs appointment with endocrinology, please see if Dr. Cruzita Lederer can see him as soon as possible.

## 2018-07-24 NOTE — Telephone Encounter (Signed)
Called pt and spoke with his wife [name] letting her know that we would let MW know that pt had labwork performed at Dr. Claris Gladden office.  [name] expressed understanding.  Dr. Melvyn Novas, please see labs that were done at Dr. Claris Gladden office yesterday, 07/23/18. Thanks!

## 2018-07-24 NOTE — Telephone Encounter (Signed)
Referral placed  Detailed message left on voicemail

## 2018-07-24 NOTE — Telephone Encounter (Signed)
Called and spoke with pt's wife [name] letting her know the information stated by MW and stated to her for pt to keep OV in which MW would go over things in more detail at that [place].  [name] expressed understanding. Nothing further needed.

## 2018-07-25 ENCOUNTER — Telehealth (HOSPITAL_COMMUNITY): Payer: Self-pay

## 2018-07-25 NOTE — Telephone Encounter (Signed)
It is possible that the shortness of breath could be related to hyperthyroidism.  Needs to get in with endocrinology asap, please get appointment.  Try to work him in to see me before he leaves.

## 2018-07-25 NOTE — Telephone Encounter (Signed)
Pt's wife called and stated that pt is anxious about his low thyroid and having colon surgery. I advised her that the low thyroid would have no interference with his cancer surgery. Wife also states that pt has extreme SOB and it comes and goes. States that it got worst when he came off amiod. Pt is traveling to [place] to go hunting from Nov 7-11th and wife is worried.

## 2018-07-29 ENCOUNTER — Encounter: Payer: Self-pay | Admitting: Internal Medicine

## 2018-07-29 ENCOUNTER — Ambulatory Visit (INDEPENDENT_AMBULATORY_CARE_PROVIDER_SITE_OTHER): Payer: Commercial Managed Care - PPO | Admitting: Internal Medicine

## 2018-07-29 VITALS — BP 116/70 | HR 54 | Ht 73.0 in | Wt 204.8 lb

## 2018-07-29 DIAGNOSIS — J449 Chronic obstructive pulmonary disease, unspecified: Secondary | ICD-10-CM | POA: Diagnosis not present

## 2018-07-29 DIAGNOSIS — R0609 Other forms of dyspnea: Secondary | ICD-10-CM

## 2018-07-29 NOTE — Telephone Encounter (Signed)
Pt is sch for 11/1

## 2018-07-29 NOTE — Patient Instructions (Addendum)
Continue symbicort 80  Take 2 puffs first thing in am and then another 2 puffs about 12 hours later if you think you need it.   Work on inhaler technique:  relax and gently blow all the way out then take a nice smooth deep breath back in, triggering the inhaler at same time you start breathing in.  Hold for up to 5 seconds if you can. Blow out thru nose. Rinse and gargle with water when done       You are cleared for colon surgery from my perspective but you final clearance from the thyroid doctor and I will send Dr Dema Severin a copy of my thoughts

## 2018-07-29 NOTE — Progress Notes (Signed)
Subjective:    Patient ID: Troy Kenner [name], male   DOB: 1941-06-05     MRN: [id number]    Brief patient profile:  10  yowm quit smoking Oct 2014 s/p RLLobecty 2012 at [facility] with Ib Sq cell ca and no endobronchial dz at that time and with no RT / chemo being followed by [facility] / [facility] with last ct chest done in 03/2014 showing persistent moderate R effusion  due for f/u 04/2015 with new onset gen persistent   fatigue winter of 2015 then very abruptly ill 01/08/15 sore throat, cough congestion yellow mucus, light headed rx doxy / pred and referrred to pulmonary clninic 02/03/2015 by Dr Linna Darner > dx was pna> cleared  Out then f/u ct at [facility] 04/19/17 showed ? Nodules in RLL and referred himself to pulmonary clinic 08/08/2017    History of Present Illness  08/08/2017  ov/Troy [name] re:  Consultation req by Dr Aundra Dubin  Chief Complaint  Patient presents with  . Follow-up    Pt is having SOB with exertion for last 6 months becoming bothersome. Pt is wheezing and has dry cough.   doe = sob getting dressed x 4-5 months  Sleeps ok Legs R > L claudication stops him before his breathing does  On amiodarone  X  03/05/17 but only 100 mg since 03/28/17  rec Complete the cardiac work up then return here to regroup if breathing not back to your level of satisfaction      08/20/17  Nl LHC/ lvedp =13 > referred back to pulmonary   09/02/2017  f/u ov/Troy [name] re:   GOLD II copd/ on amiodarone and no pulmonary meds  Chief Complaint  Patient presents with  . Follow-up    Pt states Dr Aundra Dubin rec that he come back for f/u. He states his breathing is unchanged since the last visit here.   Walking regularly around his neighborhood including some hills now but really doesn't know how to pace himself and has not been observing his 02  Saturations and shows interest in rehabilitation. rec Keep walking with goal of 30 minutes daily at a pace where you are short of breath but never out of breath You need to buy a pulse oximeter and  monitor your oxygen level when you walk and call me if you notice a downward trend in your 02 saturations or exercise toleranc (goal is keep it above 90%)  We will be referring you for pulmonary rehab      Admit date: 01/29/2018 Discharge date: 02/01/2018  Brief Narrative: Troy [name] a76 y.o.male,with past medic history significant for COPD/lung cancer/atrial fibrillation  With multiple falls and weakness for the last to days. One week ago he presented to pulmonary for chest congestion and he was started on steroids and doxycyclinehiscongestion improved however he started getting weak and had 3 falls until today when he fell and sustained a laceration on his left forehead. CT of the head was negativeearachewas having increase in tremors in his upper extremities noted by family. No chest pains, shortness of breath, nausea or vomiting lately. His labs were almost at baseline with a slightly increased creatinine to 1.54 but he was noted to Be orthostatic in the emergency room .Patient admitted with recurrent syncopal episodes secondary to orthostasis.       Discharge Diagnoses:  Active Problems:   Near syncope   Syncope   Orthostatic hypotension syncope: Secondary to orthostatics dehydration patient being on Lasix and decreased p.o. intake.  Patient ambulated today with physical  therapy and with the staff without any complaints.  Patient will be discharged home on Lasix to be started at 20 mg 02/05/2018 and doxazosin to be started on 02/05/2018 to  2 mg daily.  Diltiazem DC'd.  #2 atrial fibrillation:Persistent but usually paroxysmal. Currently on Apixaban, amiodarone. Diltiazem currently on hold  #3 chronic diastolic XBM:WUXLKGM compensated. Lasix currently on hold.  #4 COPD: Stable. Continue current treatment  #5 hyperlipidemia: Continue statin  #6 history of WNU:UVOZDG.  #7 coronary artery disease:Patient had left heart Cath in November of last year with  nonobstructive disease. Continue follow up by cardiology  #8 hypertension:Patient has orthostatic hypotension in the moment. Holding medications.    07/14/2018 acute extended ov/Troy [name] re: new doe on amiodarone since 11/27/16 per med review Chief Complaint  Patient presents with  . Acute Visit    Pt c/o increased DOE x 2 wks. He states he gets winded just getting dressed.  He also c/o wheezing and non prod cough.    Stopped symbicort shortly after last ov in 12/2017 and stayed on singulair  Started symbicort 160 x 2 back one hour prior to ov and feels a little better  Prior to taking symbicort sob with adl's and 5 laps in driveway flat 644 ft  Sleeping flat on side / one pillow/ cpap Cough is more day than noct  rec Continue symbicort 160 Take 2 puffs first thing in am and then another 2 puffs about 12 hours later Review with dr Aundra Dubin re high esr and low TSH ? Amiodarone effects > d/c'd 07/15/18      07/29/2018  f/u ov/Troy [name] re: sob  Improved / underlying copd Moderate = GOLD II baseline Chief Complaint  Patient presents with  . Follow-up    Breathing has improved some. No new co's.   Dyspnea:  Not walking regularly but plans to start  - at this point Not limited by breathing from desired activities   Cough: some pnds/ not keeping him up at night at all  Sleeping: cpap flat rotated on side  SABA use: none  02: none     No obvious day to day or daytime variability or assoc excess/ purulent sputum or mucus plugs or hemoptysis or cp or chest tightness, subjective wheeze or overt sinus or hb symptoms.   Sleeping fine without nocturnal  or early am exacerbation  of respiratory  c/o's or need for noct saba. Also denies any obvious fluctuation of symptoms with weather or environmental changes or other aggravating or alleviating factors except as outlined above   No unusual exposure hx or h/o childhood pna/ asthma or knowledge of premature birth.  Current Allergies, Complete Past  Medical History, Past Surgical History, Family History, and Social History were reviewed in [employer] record.  ROS  The following are not active complaints unless bolded Hoarseness, sore throat, dysphagia, dental problems, itching, sneezing,  nasal congestion or discharge of excess mucus or purulent secretions, ear ache,   fever, chills, sweats, unintended wt loss or wt gain, classically pleuritic or exertional cp,  orthopnea pnd or arm/hand swelling  or leg swelling, presyncope, palpitations, abdominal pain, anorexia, nausea, vomiting, diarrhea  or change in bowel habits or change in bladder habits, change in stools or change in urine, dysuria, hematuria,  rash, arthralgias, visual complaints, headache, numbness, weakness or ataxia or problems with walking or coordination,  change in mood or  memory.        Current Meds  Medication Sig  . acetaminophen (  TYLENOL) 325 MG tablet Take 650 mg every 6 (six) hours as needed by mouth for moderate pain or headache.  . bimatoprost (LUMIGAN) 0.01 % SOLN Place 1 drop at bedtime into both eyes.   . Biotin 10000 MCG TABS Take 10,000 mcg daily by mouth.   . budesonide-formoterol (SYMBICORT) 80-4.5 MCG/ACT inhaler Inhale 2 puffs into the lungs 2 (two) times daily.  [name] docusate sodium (COLACE) 100 MG capsule Take 100 mg by mouth 2 (two) times daily.  . dorzolamide-timolol (COSOPT) 22.3-6.8 MG/ML ophthalmic solution Place 2 drops into both eyes 2 (two) times daily.   . ferrous sulfate 325 (65 FE) MG tablet Take 325 mg by mouth 2 (two) times daily with a meal.  . FLUoxetine (PROZAC) 20 MG capsule TAKE (1) CAPSULE DAILY.  . furosemide (LASIX) 20 MG tablet Take 40 mg daily for 3 days, then 40 mg daily alternating with 20 mg daily  . Magnesium 500 MG CAPS Take 500 mg by mouth at bedtime.  . Melatonin 5 MG TABS Take 5 mg by mouth at bedtime.  . metoprolol succinate (TOPROL-XL) 25 MG 24 hr tablet Take 1 tablet (25 mg total) by mouth daily. Take  with or immediately following a meal.  . montelukast (SINGULAIR) 10 MG tablet TAKE ONE TABLET AT BEDTIME.  . Multiple Vitamins-Minerals (CENTRUM SILVER 50+MEN) TABS Take 1 tablet by mouth at bedtime.  [name] omeprazole (PRILOSEC) 20 MG capsule Take 1 capsule (20 mg total) by mouth daily.  Vladimir Faster Glycol-Propyl Glycol (SYSTANE OP) Place 1-2 drops as needed into both eyes (for dry eyes).  . potassium chloride SA (K-DUR,KLOR-CON) 20 MEQ tablet TAKE 1 TABLET ONCE DAILY.  . rosuvastatin (CRESTOR) 40 MG tablet TAKE 1 TABLET ONCE DAILY.                      Objective:   Physical Exam  amb wm nad / more robust today   07/29/2018    204  07/14/2018     205  12/27/2017       208  09/02/2017       209   08/08/2017      214   02/03/15 197 lb (89.359 kg)  01/12/15 200 lb 8 oz (90.946 kg)  12/28/14 200 lb (90.719 kg)      Vital signs reviewed - Note on arrival 02 sats  94% on RA           HEENT: nl dentition, turbinates bilaterally, and oropharynx. Nl external ear canals without cough reflex   NECK :  without JVD/Nodes/TM/ nl carotid upstrokes bilaterally   LUNGS: no acc muscle use,  Decreased bs R base, min bibasilar insp crackles  CV:  RRR  no s3 or murmur or increase in P2, and trace bilateral sym pedal edema   ABD:  soft and nontender with nl inspiratory excursion in the supine position. No bruits or organomegaly appreciated, bowel sounds nl  MS:  Nl gait/ ext warm without deformities, calf tenderness, cyanosis or clubbing No obvious joint restrictions   SKIN: warm and dry without lesions    NEURO:  alert, approp, nl sensorium with  no motor or cerebellar deficits apparent.                     Assessment:

## 2018-07-30 ENCOUNTER — Encounter: Payer: Self-pay | Admitting: Internal Medicine

## 2018-07-30 NOTE — Assessment & Plan Note (Addendum)
Quit smoking 07/2013 - PFT's  1/14//2018  FEV1 2.10 (62 % ) ratio 64  p 7 % improvement from saba p nothing prior to study with DLCO  40/40c % corrects to 59  % for alv volume  And typical curvature  - 08/08/2017  Walked RA x 3 laps @ 185 ft each stopped due to  End of study, nl pace, no  Desat/ min sob with legs stopping him from going further     - PFT's  08/13/17   FEV1 1.98 (59 % ) ratio 64  p nothing prior to study with DLCO  34 % corrects to 56 % for alv volume  > refused trial of saba "afraid of glaucoma effects"  - Referred to pulmonary rehab 09/02/2017 > completed end of sept 2019  - Spirometry 07/14/2018  FEV1 1.4 (40%)  Ratio 58 p am symbicort 160 x 2  - Allergy profile  07/14/18  >  Eos 0.1 /  IgE  19 RAST neg  - 07/29/2018  After extensive coaching inhaler device,  effectiveness =    75%    - The proper method of use, as well as anticipated side effects, of a metered-dose inhaler are discussed and demonstrated to the patient and wife.     Adequate control on present rx, reviewed in detail with pt > no change in rx needed  - should not be a problem for colon surgery planned and he is cleared for surgery contingent on no flares in interim   >>> Note there is no "red line" for non-thoracic surgery based on fev1 and in absence of exac/ AB/ excess secretions he should do fine but will need early mobilization /min narcs and noct cpap (perhaps with bipap bridge) while supine/ sedated immediately post op with pulmonary f/u as inpt prn    I had an extended discussion with the patient reviewing all relevant studies completed to date and  lasting 15 to 20 minutes of a 25 minute visit    See device teaching which extended face to face time for this visit.  Each maintenance medication was reviewed in detail including emphasizing most importantly the difference between maintenance and prns and under what circumstances the prns are to be triggered using an action plan format that is not reflected  in the computer generated alphabetically organized AVS which I have not found useful in most complex patients, especially with respiratory illnesses  Please see AVS for specific instructions unique to this visit that I personally wrote and verbalized to the the pt in detail and then reviewed with pt  by my nurse highlighting any  changes in therapy recommended at today's visit to their plan of care.

## 2018-07-30 NOTE — Assessment & Plan Note (Addendum)
-    02/03/2015  Walked RA x 3 laps @ 185 ft each stopped due to min sob brisk pace, L hip pain, no desat  -  LHC  08/05/17  Nl CA, nl pressures  -  HRCT  08/13/17  1. No evidence of interstitial lung disease. 2. 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 Referred to rehab 09/02/2017     - D/c amiodarone 07/15/18  With ESR 108 and chemical hyperthyroidism - the latter needs to be evaluated prior to surgery and may explain some of his doe / mild tremor and may risk "thyroid storm" if acutely ill (or major surgery) > advised pt and wife

## 2018-08-01 ENCOUNTER — Encounter (HOSPITAL_COMMUNITY): Payer: Self-pay | Admitting: Cardiology

## 2018-08-01 ENCOUNTER — Ambulatory Visit (HOSPITAL_COMMUNITY)
Admission: RE | Admit: 2018-08-01 | Discharge: 2018-08-01 | Disposition: A | Discharge status: Home / Self Care | Payer: Commercial Managed Care - PPO | Source: Ambulatory Visit | Attending: Cardiology | Admitting: Cardiology

## 2018-08-01 VITALS — BP 147/54 | HR 53 | Wt 202.4 lb

## 2018-08-01 DIAGNOSIS — I10 Essential (primary) hypertension: Secondary | ICD-10-CM | POA: Diagnosis present

## 2018-08-01 DIAGNOSIS — I5032 Chronic diastolic (congestive) heart failure: Secondary | ICD-10-CM | POA: Diagnosis not present

## 2018-08-01 DIAGNOSIS — I714 Abdominal aortic aneurysm, without rupture: Secondary | ICD-10-CM | POA: Insufficient documentation

## 2018-08-01 DIAGNOSIS — I739 Peripheral vascular disease, unspecified: Secondary | ICD-10-CM

## 2018-08-01 DIAGNOSIS — I130 Hypertensive heart and chronic kidney disease with heart failure and stage 1 through stage 4 chronic kidney disease, or unspecified chronic kidney disease: Secondary | ICD-10-CM | POA: Insufficient documentation

## 2018-08-01 DIAGNOSIS — H409 Unspecified glaucoma: Secondary | ICD-10-CM | POA: Insufficient documentation

## 2018-08-01 DIAGNOSIS — I2510 Atherosclerotic heart disease of native coronary artery without angina pectoris: Secondary | ICD-10-CM | POA: Insufficient documentation

## 2018-08-01 DIAGNOSIS — Z87891 Personal history of nicotine dependence: Secondary | ICD-10-CM | POA: Diagnosis not present

## 2018-08-01 DIAGNOSIS — Z7951 Long term (current) use of inhaled steroids: Secondary | ICD-10-CM | POA: Insufficient documentation

## 2018-08-01 DIAGNOSIS — G4733 Obstructive sleep apnea (adult) (pediatric): Secondary | ICD-10-CM | POA: Diagnosis not present

## 2018-08-01 DIAGNOSIS — N183 Chronic kidney disease, stage 3 (moderate): Secondary | ICD-10-CM | POA: Diagnosis not present

## 2018-08-01 DIAGNOSIS — I4891 Unspecified atrial fibrillation: Secondary | ICD-10-CM | POA: Diagnosis not present

## 2018-08-01 DIAGNOSIS — E785 Hyperlipidemia, unspecified: Secondary | ICD-10-CM | POA: Diagnosis not present

## 2018-08-01 DIAGNOSIS — J439 Emphysema, unspecified: Secondary | ICD-10-CM | POA: Insufficient documentation

## 2018-08-01 DIAGNOSIS — Z95828 Presence of other vascular implants and grafts: Secondary | ICD-10-CM | POA: Insufficient documentation

## 2018-08-01 DIAGNOSIS — Z85118 Personal history of other malignant neoplasm of bronchus and lung: Secondary | ICD-10-CM | POA: Diagnosis not present

## 2018-08-01 DIAGNOSIS — E0590 Thyrotoxicosis, unspecified without thyrotoxic crisis or storm: Secondary | ICD-10-CM | POA: Diagnosis not present

## 2018-08-01 DIAGNOSIS — C187 Malignant neoplasm of sigmoid colon: Secondary | ICD-10-CM | POA: Diagnosis not present

## 2018-08-01 DIAGNOSIS — I480 Paroxysmal atrial fibrillation: Secondary | ICD-10-CM | POA: Diagnosis not present

## 2018-08-01 DIAGNOSIS — Z902 Acquired absence of lung [part of]: Secondary | ICD-10-CM | POA: Insufficient documentation

## 2018-08-01 DIAGNOSIS — Z79899 Other long term (current) drug therapy: Secondary | ICD-10-CM | POA: Diagnosis not present

## 2018-08-01 DIAGNOSIS — Z8673 Personal history of transient ischemic attack (TIA), and cerebral infarction without residual deficits: Secondary | ICD-10-CM | POA: Diagnosis not present

## 2018-08-01 MED ORDER — METHIMAZOLE 10 MG PO TABS: 10.0000 mg | ORAL_TABLET | Freq: Every day | ORAL | 6 refills | Status: DC

## 2018-08-01 MED ORDER — METHIMAZOLE 10 MG PO TABS: 10.0000 mg | ORAL_TABLET | Freq: Three times a day (TID) | ORAL | 6 refills | Status: DC

## 2018-08-01 NOTE — Patient Instructions (Addendum)
START Methimazole 10mg  (1 tab) daily.  Prescription sent to pharmacy.  Your physician recommends that you schedule a follow-up appointment in: 2 months

## 2018-08-01 NOTE — Progress Notes (Signed)
Patient ID: Troy Kenner [name], male   DOB: 1941-02-16, 77 y.o.   MRN: [id number] PCP: Dr. Quay Burow Cardiology: [name]  77 y.o. with history of HTN, COPD, active smoking/COPD, carotid stenosis s/p right CEA, paroxysmal atrial fibrillation, and PAD returns for followup of CHF and atrial fibrillation.  He does not have known obstructive CAD but is at high risk for CAD based on his comorbidities.  Lexiscan Cardiolite in 3/14 showed no ischemia or infarction and echo in 3/14 showed normal EF.  He had a left fem-pop bypass at the [facility] in the '90s.  He is followed at [facility] for PAD. He has chronic right calf/thigh/buttocks claudication that is unchanged over the last few years and follows regularly at [facility].  He has had right lower lobectomy for lung cancer.  He continues to stay off cigarettes.    At a prior appointment, he was in atrial fibrillation but did not realize it.  I started him on Eliquis and diltiazem CD, and he spontaneously converted to NSR.  In 5/15, he was at [facility] and felt "strange" one day: fatigued, weak, short of breath.  He went to the ER and was in atrial fibrillation with HR in 80s-90s.  He spontaneously converted to NSR in the ER.  He felt back to normal after converting to NSR.  I started him on Multaq 400 mg bid.  With CHF, he was eventually transitioned over to amiodarone.   He had patch angioplasty revision of left fem-pop bypass in 11/16.  Now with minimal claudication.  He follows with [facility].     He had a Cardiolite and echo in 7/17 that were unremarkable.  Repeat Cardiolite in 12/17 showed no ischemia, echo was uninterpretable.  Cardiac MRI was therefore done in 1/18, showing EF 66% with normal-appearing RV, no late gadolinium enhancement.   Given increased exertional dyspnea and a defect on Cardiolite, he had LHC in 11/18.  This showed no obstructive CAD. He had a chest CT done given concern for possible amiodarone lung toxicity with increased dyspnea. This showed emphysema,  no ILD.    In 5/19, he developed a probable viral syndrome with dehydration and had a presyncopal episode.  He was orthostatic in the hospital.  He was noted to be in atrial fibrillation this admission which remained persistent.  He had a TEE-guided DCCV back to NSR.   In 9/19, he was admitted with GI bleeding and found to have a sigmoid mass on colonoscopy, path showed sigmoid adenocarcinoma.  He was noted to have right paratracheal lymphadenopathy but EUS with biopsy in 10/19 did not show malignancy.  Eliquis was stopped with bleeding sigmoid mass.  He was noted to be back in atrial fibrillation this admission.   Amiodarone was stopped with elevated ESR and increased dyspnea.  He was also found to have new hyperthyroidism.   He returns for followup of diastolic CHF and atrial fibrillation.  Weight is down 2 lbs.  He is short of breath after walking about a block or walking briskly up stairs but says that he is feeling better.  +Fatigue.  No orthopnea/PND.  No chest pain.  He is in NSR today, off amiodarone.  No stroke-like symptoms, anticoagulation on hold with bleeding from colon tumor.  Chronic claudication, no change and not particularly bothersome.   ECG (personally reviewed): Sinus brady at 50, o/w normal  Labs (3/13): LDL 75, HDL 35, K 4.1, creatinine 1.1 Labs (3/14): LDL 91, HDL 27 Labs (10/14): K 4.3, creatinine  0.98 Labs (4/15): K 4.9, creatinine 1.1, LDL 76, HDL 30 Labs (5/15): K 4.3, creatinine 1.7, BNP 251 Labs (6/15): K 4.4, creatinine 1.3 Labs (10/15): TSH normal Labs (5/16): K 4.2, creatinine 1.12, HCT 39.5, LFTs normal, LDL 84, HDL 43, TSH normal Labs (11/16): creatinine 0.94 Labs (4/17): LDL 39, HDL 39 Labs (6/17): K 4.5, creatinine 1.2 Labs (9/17): K 4, creatinine 1.25, HCT 38.7 Labs (12/17): K 4.5, creatinine 1.49 => 1.62, BNP 43 Labs (2/18): K 3.8, creatinine 1.33, LFTs normal, TSH normal Labs (6/18): K 4.3, creatinine 1.49, LFTs normal, TSH normal, hgb 12.3 Labs  (11/18): ESR 60, TSH normal, LFTs normal, creatinine 1.34 Labs (1/19): LDL 50 Labs (5/19): K 4.6, creatinine 1.55, LFTs normal, hgb 12.7, LFTs normal Labs (10/19): K 4 => 4.1, creatinine 1.39 => 1.43, BNP 279, hgb 10.7, TSH low, free T4 and free T3 low, ESR 108  PMH: 1. HTN: Fatigue and cough with ramipril use.  2. COPD: Quit smoking 2014. PFTs (1/18) with moderately severe COPD.  - CT chest (11/18): Emphysema noted, no ILD.  3. AAA: CT 1/13 with 3.0 cm AAA.  Abdominal US (1/14) with 3.4 cm AAA. Abdominal US (4/15) with 3.25 x 3.27 AAA. Abdominal US (3/16) with 3.7 cm AAA.  Followed at [facility].  - Abd [name] (1/19): 3.3 cm AAA. 4. Squamous cell lung cancer diagnosed 2/12.  Had right lower lobectomy in 4/12.  5. Hyperlipidemia 6. PAD: Left fem-pop bypass 1994.  ABIs (2/12) 0.62 on right, 0.95 on left. ABIs (1/14): 0.65 on right, 1.04 on left. ABIs (4/15) 0.66 right, 0.87 left.  ABIs (3/16) 0.6 right, 0.88 left.  Patch angioplasty left fem-pop bypass in 11/16.  - Left fem-pop bypass patent on doppler evaluation in 12/17.  7. Stress myoview 2004 was normal. Lexiscan Cardiolite (3/14) with EF 66%, no ischemia or infarction. Lexiscan Cardiolite (7/17) with EF 61%, no ischemia/infarction.  - Cardiolite (12/17) with EF 62%, inferior/inferolateral fixed defect, most likely diaphragmatic attenuation, no ischemia.  - Cardiolite (9/18) with EF > 65%, fixed inferior defect (attenuation versus infarction), no ischemia.  - LHC (11/18): No obstructive CAD.  8. Chronic diastolic CHF: Echo (4/40): technically difficult with EF 55-60%, upper normal RV size.  Echo (5/16) with EF 60-65%.  Echo (7/17) with EF 55-60%, normal RV size and systolic function.  - Echo 12/17 with very poor windows, unable to comment on LV or RV function.  - Cardiac MRI (1/18) with EF 66%, normal RV size and systolic function.  - TEE (5/19): EF 55-60%, normal RV size and systolic function.  9. Carotid stenosis: TIA 10/14.  Carotid dopplers with >  10% RICA, 27-25% LICA.  Patient had right CEA in 10/14. Carotids (4/15) patent right CEA, LICA 36-64% stenosis.  Carotids (40/34) < 74% LICA, patent right CEA.  - Carotid dopplers (12/17): right CEA ok, < 25% LICA stenosis.  - Carotid dopplers (1/19): Right CEA ok, 9-56% LICA stenosis.  10. Atrial fibrillation: Paroxysmal. First noted after lobectomy in 4/12 (brief), recurrence in 4/15 then in 5/15.  - TEE-guided DCCV for persistent atrial fibrillation in 5/19.  11. Spinal stenosis 12. OSA: Using CPAP.  13. Glaucoma 14. Has ILR 15. Colon cancer: Sigmoid adenocarcinoma 16. 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   SH: Married, lives in [place].  Former [place].  Quit smoking in 2014.  Rare ETOH now.   FH: No premature CAD  ROS: All systems reviewed and negative except as per HPI.   Current Outpatient Medications  Medication Sig Dispense Refill  . acetaminophen (TYLENOL) 325 MG tablet Take 650 mg every 6 (six) hours as needed by mouth for moderate pain or headache.    . bimatoprost (LUMIGAN) 0.01 % SOLN Place 1 drop at bedtime into both eyes.     . Biotin 10000 MCG TABS Take 10,000 mcg daily by mouth.     . budesonide-formoterol (SYMBICORT) 80-4.5 MCG/ACT inhaler Inhale 2 puffs into the lungs 2 (two) times daily.    [name] docusate sodium (COLACE) 100 MG capsule Take 100 mg by mouth 2 (two) times daily.    . dorzolamide-timolol (COSOPT) 22.3-6.8 MG/ML ophthalmic solution Place 2 drops into both eyes 2 (two) times daily.     . ferrous sulfate 325 (65 FE) MG tablet Take 325 mg by mouth 2 (two) times daily with a meal.    . FLUoxetine (PROZAC) 20 MG capsule TAKE (1) CAPSULE DAILY. 90 capsule 1  . furosemide (LASIX) 20 MG tablet Take 40 mg daily for 3 days, then 40 mg daily alternating with 20 mg daily 60 tablet 11  . Magnesium 500 MG CAPS Take 500 mg by mouth at bedtime.    . Melatonin 5 MG TABS Take 5 mg by mouth at bedtime.    . metoprolol succinate (TOPROL-XL)  25 MG 24 hr tablet Take 1 tablet (25 mg total) by mouth daily. Take with or immediately following a meal. 90 tablet 3  . montelukast (SINGULAIR) 10 MG tablet TAKE ONE TABLET AT BEDTIME. 30 tablet 2  . Multiple Vitamins-Minerals (CENTRUM SILVER 50+MEN) TABS Take 1 tablet by mouth at bedtime.    [name] omeprazole (PRILOSEC) 20 MG capsule Take 1 capsule (20 mg total) by mouth daily. 90 capsule 3  . Polyethyl Glycol-Propyl Glycol (SYSTANE OP) Place 1-2 drops as needed into both eyes (for dry eyes).    . potassium chloride SA (K-DUR,KLOR-CON) 20 MEQ tablet TAKE 1 TABLET ONCE DAILY. 30 tablet 3  . rosuvastatin (CRESTOR) 40 MG tablet TAKE 1 TABLET ONCE DAILY. 30 tablet 2  . methimazole (TAPAZOLE) 10 MG tablet Take 1 tablet (10 mg total) by mouth daily. 30 tablet 6   No current facility-administered medications for this encounter.     BP (!) 147/54   Pulse (!) 53   Wt 91.8 kg (202 lb 6.4 oz)   SpO2 96%   BMI 26.70 kg/m  General: NAD Neck: JVP 8 cm, no thyromegaly or thyroid nodule.  Lungs: Clear to auscultation bilaterally with normal respiratory effort. CV: Nondisplaced PMI.  Heart regular S1/S2, no S3/S4, no murmur.  1+ left ankle edema (chronic asymmetry).  No carotid bruit.  Normal pedal pulses.  Abdomen: Soft, nontender, no hepatosplenomegaly, no distention.  Skin: Intact without lesions or rashes.  Neurologic: Alert and oriented x 3. Mild tremor.  Psych: Normal affect. Extremities: No clubbing or cyanosis.  HEENT: Normal.   Assessment/Plan: 1. Chronic diastolic CHF: TEE in 2/97 showed LV EF 55-60% with normal RV size and systolic function.  Volume status looks ok today, he feels better (and is back in NSR). - Continue alternating Lasix 40 mg daily with 20 mg daily.  Recent BMET was stable.  2. Hyperlipidemia: Continue Crestor.  Good lipids in 1/19.  3. Carotid stenosis: s/p R CEA.  Dopplers followed at [facility], stable when done recently.  4. PAD: s/p patch angioplasty to left fem-pop bypass in  11/16.  Followed at [facility]. He is off cilostazol with CHF but his claudication has actually improved over time with increased exercise.  5. AAA: Stable on last [name], followed at [facility].    6. COPD: No longer smoking. PFTs in 1/18 showed moderately severe obstructive defect. CT chest in 11/18 showed emphysema.  He is not on inhalers due to glaucoma.   - He follows with pulmonary. 7. Atrial fibrillation: He is symptomatic when in atrial fibrillation, in NSR today.  Atrial fibrillation seems to worsen his CHF. He is off anticoagulation due to bleeding sigmoid adenocarcinoma. He will not be able to restart until he has had the sigmoid mass removed.  Additionally, TSH was low when checked in 10/19 concerning for amiodarone-related hyperthyroidism, and there was also concern for amiodarone lung toxicity. He is now off amiodarone but is actually in NSR today.  - Continue Toprol XL 25 mg daily.  - He is off Eliquis for reasons outlined above, restart after cancer is resected.  - Continue to limit ETOH.  - Continue to use CPAP.  8. CKD: Stage 3. Stable BMET recently.   9. CAD: LHC in 11/18 with nonobstructive disease only.   10. 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  He has a mild tremor and fatigue that may be related.  - I will start methimazole 10 mg tid.  He has appointment with endocrinology on 08/14/18.    11. Colon cancer: Sigmoid adenocarcinoma. Needs surgical resection.  He is at moderate surgical risk from a cardiac standpoint but not prohibitive.  Continue Toprol XL peri-op and start back on Eliquis after surgery.   12. Dyspnea: Suspect multifactorial with anemia, diastolic CHF, persistent atrial fibrillation, hyperthyroidism.  Cannot rule out amiodarone lung toxicity.    Followup in 2 months.  Cleared from cardiac standpoint for surgery.  Would like him to see endocrinology pre-op to make sure hyperthyroidism is controlled.    [name] 08/01/2018

## 2018-08-05 ENCOUNTER — Telehealth: Payer: Self-pay | Admitting: Oncology

## 2018-08-05 NOTE — Telephone Encounter (Signed)
R/s 11/29 appt due to GBS out of office in the afternoon - pt is aware of new appt time.

## 2018-08-08 LAB — CUP PACEART REMOTE DEVICE CHECK
Date Time Interrogation Session: 20191022033941
MDC IDC PG IMPLANT DT: 20161111

## 2018-08-14 ENCOUNTER — Ambulatory Visit (INDEPENDENT_AMBULATORY_CARE_PROVIDER_SITE_OTHER): Payer: Commercial Managed Care - PPO | Admitting: Internal Medicine

## 2018-08-14 ENCOUNTER — Encounter: Payer: Self-pay | Admitting: Internal Medicine

## 2018-08-14 VITALS — BP 138/60 | HR 57 | Ht 73.0 in | Wt 198.0 lb

## 2018-08-14 DIAGNOSIS — E0590 Thyrotoxicosis, unspecified without thyrotoxic crisis or storm: Secondary | ICD-10-CM

## 2018-08-14 NOTE — Progress Notes (Addendum)
Patient ID: Troy Kenner [name], male   DOB: [birth year], 77 y.o.   MRN: [id number]    HPI  Troy Linch [name] is a 77 y.o.-year-old male, referred by his cardiologist, 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  He is here with his son who offers part of the history, especially about his medical history and previous investigations.  Patient is seen in the cardiology clinic for several cardiovascular conditions: paroxysmal Afib, Diastolic CHF, CAD.  He also has a history of AAA, PAD, history of TIA.  He was found to have a low TSH at his visit on 07/14/2018.  He had repeat TFTs 9 days later and this showed again a suppressed TSH and also elevated free T4 and free T3.    Of note, he takes biotin 10,000 mcg daily and he took this before the last sets of labs. He had a CT of the chest with contrast on 06/25/2018.  At that time, his amiodarone (that he was taking for almost 1 year) was stopped.  He was started on methimazole 10 mg daily.  He also started Toprol-XL 25 mg daily.  He has a relatively new diagnosis of colon cancer and he will need to be cleared for surgery from the thyroid point of view.  I reviewed pt's thyroid tests -previous TFTs were normal since 2015: Lab Results  Component Value Date   TSH 0.010 (L) 07/23/2018   TSH 0.03 (L) 07/14/2018   TSH 0.751 06/24/2018   TSH 1.581 05/26/2018   TSH 1.519 08/05/2017   TSH 1.224 03/28/2017   TSH 1.744 11/27/2016   TSH 1.24 02/08/2015   TSH 1.52 07/30/2014   TSH 1.53 12/28/2013   FREET4 2.94 (H) 07/23/2018   FREET4 0.9 07/29/2006   T3FREE 4.7 (H) 07/23/2018   T3FREE 3.0 07/29/2006   Antithyroid antibodies: No results found for: TSI  Pt denies: - feeling nodules in neck - hoarseness - dysphagia - choking - SOB with lying down  She denies: - fatigue - excessive sweating/heat intolerance - tremors - anxiety - palpitations - weight loss - hair loss - hyperdefecation  Pt does not have a FH of  thyroid ds. No FH of thyroid cancer. No h/o radiation tx to head or neck.  No seaweed or kelp,+recent contrast studies (see above). No steroid use. No herbal supplements. + Biotin use (see above).  Pt. also has a history of lung cancer, HL, COPD, OSA - on CPAP.  ROS: Constitutional: no weight gain/loss, no fatigue, no subjective hyperthermia/hypothermia Eyes: no blurry vision, no xerophthalmia ENT: no sore throat, + see HPI Cardiovascular: no CP/SOB/palpitations/leg swelling Respiratory: no cough/SOB Gastrointestinal: no N/V/D/C Musculoskeletal: no muscle/joint aches Skin: no rashes Neurological: no tremors/numbness/tingling/dizziness Psychiatric: no depression/anxiety  Past Medical History:  Diagnosis Date  . AAA (abdominal aortic aneurysm) ([place]) LAST ABDOMINAL US 10-20-17 3.3 CM   MONITORED BY DR [name]  . Arthritis   . Atrial fibrillation ([place])   . Bilateral carotid artery stenosis DUPLEX 12-29-2012  BY DR [facility]   BILATERAL ICA STENOSIS 60-79%  . BPH (benign prostatic hypertrophy)   . Cancer ([place]) 01/25/2012   LUNG  . Complication of anesthesia    had nausea after carotid artery surgery, states the medicine given to help the nausea, made it worse  . COPD (chronic obstructive pulmonary disease) ([place])   . GERD (gastroesophageal reflux disease)   . Glaucoma BOTH EYES   Dr Gershon Crane  . History of basal cell carcinoma excision   . History of lung  cancer APRIL 2012  SQUAMOUS CELL---- S/P RIGHT LOWER LOBECTOMY AT [facility] --  NO CHEMORADIATION---  NO RECURRENCE    ONCOLOGIST- DR Tressie Stalker  LOV IN [facility] 10-27-2012  . Hx of adenomatous colonic polyps 2005    X 2; 1 hyperplastic polyp; Dr Olevia Perches  . Hyperlipidemia   . Hypertension   . Impaired fasting glucose 2007   108; A1c5.4%  . Lesion of bladder   . Microhematuria   . OSA (obstructive sleep apnea) 08/29/2015   CPAP SET ON 10  . PAD (peripheral artery disease) ([place]) ABI'S  JAN 2014  0.65 ON RIGHT ;  1.04 ON LEFT  . Peripheral  vascular disease ([place]) S/P ANGIOPLASTY AND STENTING   FOLLOWED  BY DR [name]  . Spinal stenosis Sept. 2015  . Status post placement of implantable loop recorder   . Stroke [facility]) Jul 07, 2013   mini  TIA  . Urgency of urination    Past Surgical History:  Procedure Laterality Date  . ANGIO PLASTY     X 4 in legs  . AORTOGRAM  07-27-2002   MILD DIFFUSE ILIAC ARTERY OCCLUSIVE DISEASE /  LEFT RENAL ARTERY 20%/ PATENT LEFT FEM-POP GRAFT/ MILD SFA AND POPLITEAL ARTERY OCCLUSIVE DISEASE W/ SEVERE KIDNEY OCCLUSIVE DISEASE  . BASAL CELL CARCINOMA EXCISION     MULTIPLE TIMES--  RIGHT FOREARM, CHEEKS, AND BACK  . BIOPSY  06/25/2018   Procedure: BIOPSY;  Surgeon: Rush Landmark Telford Nab., MD;  Location: [place];  Service: Gastroenterology;;  . CARDIOVASCULAR STRESS TEST  12-08-2012  DR MCLEAN   NORMAL LEXISCAN WITH NO EXERCISE NUCLEAR STUDY/ EF 66%/   NO ISCHEMIA/ NO SIGNIFICANT CHANGE FROM PRIOR STUDY  . CARDIOVERSION N/A 02/14/2018   Procedure: CARDIOVERSION;  Surgeon: Larey Dresser, MD;  Location: [facility] ENDOSCOPY;  Service: Cardiovascular;  Laterality: N/A;  . CAROTID ANGIOGRAM N/A 07/10/2013   Procedure: CAROTID ANGIOGRAM;  Surgeon: Elam Dutch, MD;  Location: [facility] CATH LAB;  Service: Cardiovascular;  Laterality: N/A;  . CAROTID ENDARTERECTOMY Right 07-14-13   cea  . CATARACT EXTRACTION W/ INTRAOCULAR LENS  IMPLANT, BILATERAL    . colon polyectomy    . COLONOSCOPY WITH PROPOFOL N/A 06/25/2018   Procedure: COLONOSCOPY WITH PROPOFOL;  Surgeon: Rush Landmark Telford Nab., MD;  Location: [place];  Service: Gastroenterology;  Laterality: N/A;  . CYSTOSCOPY W/ RETROGRADES Bilateral 01/21/2013   Procedure: CYSTOSCOPY WITH RETROGRADE PYELOGRAM;  Surgeon: Molli Hazard, MD;  Location: [facility];  Service: Urology;  Laterality: Bilateral;   CYSTO, BLADDER BIOPSY, BILATERAL RETROGRADE PYELOGRAM  RAD TECH FROM RADIOLOGY PER JOY  . CYSTOSCOPY WITH BIOPSY N/A 01/21/2013    Procedure: CYSTOSCOPY WITH BIOPSY;  Surgeon: Molli Hazard, MD;  Location: [facility];  Service: Urology;  Laterality: N/A;  . ENDARTERECTOMY Right 07/14/2013   Procedure: ENDARTERECTOMY CAROTID;  Surgeon: Angelia Mould, MD;  Location: [place];  Service: Vascular;  Laterality: Right;  . EP IMPLANTABLE DEVICE N/A 08/12/2015   Procedure: Loop Recorder Insertion;  Surgeon: Thompson Grayer, MD;  Location: [place] CV LAB;  Service: Cardiovascular;  Laterality: N/A;  . ESOPHAGOGASTRODUODENOSCOPY (EGD) WITH PROPOFOL N/A 06/25/2018   Procedure: ESOPHAGOGASTRODUODENOSCOPY (EGD) WITH PROPOFOL;  Surgeon: Rush Landmark Telford Nab., MD;  Location: [place];  Service: Gastroenterology;  Laterality: N/A;  . ESOPHAGOGASTRODUODENOSCOPY (EGD) WITH PROPOFOL N/A 07/10/2018   Procedure: ESOPHAGOGASTRODUODENOSCOPY (EGD) WITH PROPOFOL;  Surgeon: Milus Banister, MD;  Location: [facility] ENDOSCOPY;  Service: Endoscopy;  Laterality: N/A;  . EUS N/A 07/10/2018  Procedure: UPPER ENDOSCOPIC ULTRASOUND (EUS) RADIAL;  Surgeon: Milus Banister, MD;  Location: [facility] ENDOSCOPY;  Service: Endoscopy;  Laterality: N/A;  . FEMORAL-POPLITEAL BYPASS GRAFT Left [facility]   AND 2001  IN [place]  . FEMORAL-POPLITEAL BYPASS GRAFT Left 08/30/2015   Procedure: REVISION OF BYPASS GRAFT Left  FEMORAL-POPLITEAL ARTERY;  Surgeon: Angelia Mould, MD;  Location: [place];  Service: Vascular;  Laterality: Left;  . FINE NEEDLE ASPIRATION N/A 07/10/2018   Procedure: FINE NEEDLE ASPIRATION (FNA) LINEAR;  Surgeon: Milus Banister, MD;  Location: [facility] ENDOSCOPY;  Service: Endoscopy;  Laterality: N/A;  . LARYNGOSCOPY  06-27-2004   BX VOCAL CORD  (LEUKOPLAKIA)  PER PT NO ISSUES SINCE  . LEFT HEART CATH AND CORONARY ANGIOGRAPHY N/A 08/20/2017   Procedure: LEFT HEART CATH AND CORONARY ANGIOGRAPHY;  Surgeon: Larey Dresser, MD;  Location: [place] CV LAB;  Service: Cardiovascular;  Laterality: N/A;  . LOWER  EXTREMITY ANGIOGRAM Bilateral 08/29/2015   Procedure: Lower Extremity Angiogram;  Surgeon: Angelia Mould, MD;  Location: [place] CV LAB;  Service: Cardiovascular;  Laterality: Bilateral;  . LUNG LOBECTOMY  01/24/2011    RIGHT UPPER LOBE  (SQUAMOUS CELL CARCINOMA) Dr Dorthea Cove , [facility]. No chemotherapyor radiation  . PATCH ANGIOPLASTY Right 07/14/2013   Procedure: PATCH ANGIOPLASTY;  Surgeon: Angelia Mould, MD;  Location: [place];  Service: Vascular;  Laterality: Right;  . PATCH ANGIOPLASTY Left 08/30/2015   Procedure: VEIN PATCH ANGIOPLASTY OF PROXIMAL Left BYPASS GRAFT;  Surgeon: Angelia Mould, MD;  Location: [place];  Service: Vascular;  Laterality: Left;  . PERIPHERAL VASCULAR CATHETERIZATION N/A 08/29/2015   Procedure: Abdominal Aortogram;  Surgeon: Angelia Mould, MD;  Location: [place] CV LAB;  Service: Cardiovascular;  Laterality: N/A;  . POLYPECTOMY  06/25/2018   Procedure: POLYPECTOMY;  Surgeon: Rush Landmark Telford Nab., MD;  Location: [place];  Service: Gastroenterology;;  . Lia Foyer INJECTION  06/25/2018   Procedure: SUBMUCOSAL INJECTION;  Surgeon: Irving Copas., MD;  Location: [place];  Service: Gastroenterology;;  . TEE WITHOUT CARDIOVERSION N/A 02/14/2018   Procedure: TRANSESOPHAGEAL ECHOCARDIOGRAM (TEE);  Surgeon: Larey Dresser, MD;  Location: [facility] ENDOSCOPY;  Service: Cardiovascular;  Laterality: N/A;  . trabecular surgery     OS  . TRANSTHORACIC ECHOCARDIOGRAM  12-29-2012  DR [facility]   MILD LVH/  LVSF NORMAL/ EF 95-62%/  GRADE I DIASTOLIC DYSFUNCTION   Social History   Socioeconomic History  . Marital status: Married    Spouse name: [name]   . Number of children: 3  . Years of education: 12+  . Highest education level: Not on file  Occupational History  . 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 Financial resource strain: Not on file  . Food insecurity:    Worry: Not on file    Inability: Not on file  . Transportation needs:    Medical: Not on file    Non-medical: Not on file  Tobacco Use  . Smoking status: Former Smoker    Packs/day: 0.50    Years: 50.00    Pack years: 25.00    Types: Cigarettes    Last attempt to quit: 07/28/2014    Years since quitting: 4.0  . Smokeless tobacco: Never Used  Substance and Sexual Activity  . Alcohol use: Yes    Alcohol/week: 0.0 standard drinks  Comment:  socially, variable  . Drug use: No  . Sexual activity: Not on file  Lifestyle  . Physical activity:    Days per week: Not on file    Minutes per session: Not on file  . Stress: Not on file  Relationships  . Social connections:    Talks on phone: Not on file    Gets together: Not on file    Attends religious service: Not on file    Active member of club or organization: Not on file    Attends meetings of clubs or organizations: Not on file    Relationship status: Not on file  . Intimate partner violence:    Fear of current or ex partner: Not on file    Emotionally abused: Not on file    Physically abused: Not on file    Forced sexual activity: Not on file  Other Topics Concern  . Not on file  Social History Narrative   Patient lives at home with spouse [name]   Patient has 3 children    Patient is right handed    Current Outpatient Medications on File Prior to Visit  Medication Sig Dispense Refill  . acetaminophen (TYLENOL) 325 MG tablet Take 650 mg every 6 (six) hours as needed by mouth for moderate pain or headache.    . bimatoprost (LUMIGAN) 0.01 % SOLN Place 1 drop at bedtime into both eyes.     . Biotin 10000 MCG TABS Take 10,000 mcg daily by mouth.     . budesonide-formoterol (SYMBICORT) 80-4.5 MCG/ACT inhaler Inhale 2 puffs into the lungs 2 (two) times daily.    [name] docusate sodium (COLACE) 100 MG capsule Take 100 mg by mouth 2 (two) times daily.    .  dorzolamide-timolol (COSOPT) 22.3-6.8 MG/ML ophthalmic solution Place 2 drops into both eyes 2 (two) times daily.     . ferrous sulfate 325 (65 FE) MG tablet Take 325 mg by mouth 2 (two) times daily with a meal.    . FLUoxetine (PROZAC) 20 MG capsule TAKE (1) CAPSULE DAILY. 90 capsule 1  . furosemide (LASIX) 20 MG tablet Take 40 mg daily for 3 days, then 40 mg daily alternating with 20 mg daily 60 tablet 11  . Magnesium 500 MG CAPS Take 500 mg by mouth at bedtime.    . Melatonin 5 MG TABS Take 5 mg by mouth at bedtime.    . methimazole (TAPAZOLE) 10 MG tablet Take 1 tablet (10 mg total) by mouth daily. 30 tablet 6  . metoprolol succinate (TOPROL-XL) 25 MG 24 hr tablet Take 1 tablet (25 mg total) by mouth daily. Take with or immediately following a meal. 90 tablet 3  . montelukast (SINGULAIR) 10 MG tablet TAKE ONE TABLET AT BEDTIME. 30 tablet 2  . Multiple Vitamins-Minerals (CENTRUM SILVER 50+MEN) TABS Take 1 tablet by mouth at bedtime.    [name] omeprazole (PRILOSEC) 20 MG capsule Take 1 capsule (20 mg total) by mouth daily. 90 capsule 3  . Polyethyl Glycol-Propyl Glycol (SYSTANE OP) Place 1-2 drops as needed into both eyes (for dry eyes).    . potassium chloride SA (K-DUR,KLOR-CON) 20 MEQ tablet TAKE 1 TABLET ONCE DAILY. 30 tablet 3  . rosuvastatin (CRESTOR) 40 MG tablet TAKE 1 TABLET ONCE DAILY. 30 tablet 2   No current facility-administered medications on file prior to visit.    Allergies  Allergen Reactions  . Niacin-Lovastatin Er Shortness Of Breath and Other (See Comments)    Dyspnea, flushing  .  Penicillins Hives, Shortness Of Breath and Other (See Comments)    Flushing  & dyspnea Because of a history of documented adverse serious drug reaction;Medi Alert bracelet  is recommended PCN reaction causing immediate rash, facial/tongue/throat swelling, SOB or lightheadedness with hypotension: Yes PCN reaction causing severe rash involving mucus membranes or skin necrosis: No PCN reaction  occurring within the last 10 years: NO PCN reaction that required hospitalization: NO  . Sulfonamide Derivatives Hives, Shortness Of Breath and Other (See Comments)    Flushing & dyspnea Because of a history of documented adverse serious drug reaction;Medi Alert bracelet  is recommended  . Atorvastatin Other (See Comments)    Myalgias & athralgias- takes Crestor, DOES NOT LIKE LIPITOR   Family History  Problem Relation Age of Onset  . Stroke Mother        mini strokes  . Alcohol abuse Father   . Heart disease Father        MI after 46  . Stroke Father   . Hypertension Father   . Heart attack Father   . Heart disease Paternal Aunt        several  . Hypertension Paternal Aunt        several  . Stroke Paternal Aunt        several  . Stroke Paternal Uncle        several  . Heart disease Paternal Uncle        several;2 had MI pre 42  . Cancer Daughter 33       breast ca, also with benign sessile polyp     PE: BP 138/60   Pulse (!) 57   Ht 6\' 1"  (1.854 m)   Wt 198 lb (89.8 kg)   SpO2 94%   BMI 26.12 kg/m  Wt Readings from Last 3 Encounters:  08/14/18 198 lb (89.8 kg)  08/01/18 202 lb 6.4 oz (91.8 kg)  07/29/18 204 lb 12.8 oz (92.9 kg)   Constitutional: overweight, in NAD Eyes: PERRLA, EOMI, no exophthalmos, no lid lag, no stare ENT: moist mucous membranes, no thyromegaly, no cervical lymphadenopathy Cardiovascular: RRR, No MRG Respiratory: CTA B Gastrointestinal: abdomen soft, NT, ND, BS+ Musculoskeletal: no deformities, strength intact in all 4 Skin: moist, warm, no rashes Neurological: no tremor with outstretched hands, DTR normal in all 4  ASSESSMENT: 1. Thyrotoxicosis  PLAN:  1. Patient with a recently found low TSH, without thyrotoxic sxs: No reported weight loss (however, weight is lower by 6 pounds in the last 2 weeks), heat intolerance, hyperdefecation, palpitations, anxiety.  He does have PAF with the last episode of A. fib at the beginning of 07/2018. -  Patient had normal thyroid test in the past but was found to have a low TSH a month ago.  This was confirmed on the repeat thyroid test panel (low TSH, high free T4 and free T3).  However, patient takes a high dose of biotin (unclear when this was started) and we discussed that this can interfere with the thyroid test assay and because of thyrotoxicosis-like picture.  Also, the fact that he had a contrasted CT scan (a high iodine load) 2 weeks prior to the test could have influence the thyroid tests.  Lastly, he was on amiodarone, which can cause a thyrotoxicosis-like picture even months after starting treatment.  We discussed that amiodarone can cause either thyroiditis or frank hyperthyroidism.  These conditions are treated differently, the first with prednisone in the second with methimazole.  It is usually difficult  to differentiate between the 2, so we frequently start both of the medicines.  He was already started on thionamide (methimazole 10 mg daily)  and beta-blocker (Toprol-XL 25 mg daily), which we will continue for now.  Unfortunately, I cannot check his TFTs today since he just took biotin this morning.  Due to the high dose that he is taking (10,000 mcg) we need to a more than a week for this to clear out from his system.  I asked him to come back in 1.5 weeks for recheck of his TFTs.  In the meantime, we can continue the methimazole and beta-blocker.  I will also add TSI antibodies to screen for Graves' disease, but this is less likely compared to the above possible culprits.  I do not feel nodules at palpation of his thyroid to point towards a toxic adenoma.  Subacute thyroiditis is also possible, but I feel that the above etiologies are more likely for him. - He is anxious to get his colon cancer surgery scheduled but I explained that he is at high risk for anesthesia if we proceed while he is thyrotoxic.  I will wait for the results of his next thyroid tests before deciding whether he can be  cleared for surgery or wait for a longer period of time until we bring his TFTs under control. - RTC in 3 months, but sooner for repeat labs  Orders Placed This Encounter  Procedures  . TSH  . T4, free  . T3, free  . Thyroid stimulating immunoglobulin  . Sedimentation Rate  When the results are back, he needs sx clearance sent to Dr. Dema Severin - [facility].  Component     Latest Ref Rng & Units 08/27/2018  Sed Rate     0 - 20 mm/hr 47 (H)  Triiodothyronine,Free,Serum     2.3 - 4.2 pg/mL 5.6 (H)  T4,Free(Direct)     0.60 - 1.60 ng/dL 3.84 (H)  TSH     0.35 - 4.50 uIU/mL <0.01 (L)   Lab Results  Component Value Date   TSI <89 08/27/2018  TSIs not elevated.  TFTs are worse >> will start Prednisone 5 mg daily and increase MMI to 10 mg in am and 5 mg in pm.  I do not feel it is safe to proceed with surgery for now.  Will have him back for labs in 1 mo.  Philemon Kingdom, MD PhD [facility] Endocrinology

## 2018-08-14 NOTE — Patient Instructions (Addendum)
Please continue methimazole 10 mg daily and Toprol-XL 25 mg daily.  Please stop Biotin!  Come back for labs in 1.5 weeks.  Please come back for a follow-up appointment in 3 months.

## 2018-08-17 ENCOUNTER — Other Ambulatory Visit (HOSPITAL_COMMUNITY): Payer: Self-pay | Admitting: Cardiology

## 2018-08-17 DIAGNOSIS — R0602 Shortness of breath: Secondary | ICD-10-CM

## 2018-08-17 DIAGNOSIS — I509 Heart failure, unspecified: Secondary | ICD-10-CM

## 2018-08-17 NOTE — Addendum Note (Signed)
Encounter addended by: Rowe Pavy, RN on: 08/17/2018 6:59 PM  Actions taken: Episode resolved, Flowsheet data copied forward, Visit Navigator Flowsheet section accepted, Sign clinical note

## 2018-08-17 NOTE — Progress Notes (Signed)
Discharge Progress Report  Patient Details  Name: Troy Wymer [name] MRN: [id number] Date of Birth: June 13, 1941 Referring Provider:     Pulmonary Rehab Walk Test from 03/25/2018 in [place]  Referring Provider  Dr. Melvyn Novas       Number of Visits: 19  Reason for Discharge:  Patient reached a stable level of exercise. Patient independent in their exercise.  Smoking History:  Social History   Tobacco Use  Smoking Status Former Smoker  . Packs/day: 0.50  . Years: 50.00  . Pack years: 25.00  . Types: Cigarettes  . Last attempt to quit: 07/28/2014  . Years since quitting: 4.0  Smokeless Tobacco Never Used    Diagnosis:  Chronic obstructive pulmonary disease, unspecified COPD type ([place])  COPD mixed type ([place])  ADL [facility]: Pulmonary Assessment Scores    Row Name 03/25/18 1543 03/27/18 0730 06/18/18 1517     ADL [facility]   ADL Phase  Entry  Entry  Entry   SOB Score total  33  -  57     CAT Score   CAT Score  5  -  16     mMRC Score   mMRC Score  -  0  -   Row Name 06/19/18 1629         ADL [facility]   ADL Phase  Exit       mMRC Score   mMRC Score  0        Initial Exercise Prescription: Initial Exercise Prescription - 03/27/18 0700      Date of Initial Exercise RX and Referring Provider   Date  03/25/18    Referring Provider  Dr. Melvyn Novas      Bike   Level  0.6    Minutes  17      NuStep   Level  4    SPM  80    Minutes  17    METs  1.7      Track   Laps  10    Minutes  17      Prescription Details   Frequency (times per week)  2    Duration  Progress to 45 minutes of aerobic exercise without signs/symptoms of physical distress      Intensity   THRR 40-80% of Max Heartrate  57-114    Ratings of Perceived Exertion  11-13    Perceived Dyspnea  0-4      Progression   Progression  Continue progressive overload as per policy without signs/symptoms or physical distress.      Resistance Training   Training Prescription  Yes     Weight  blue bands    Reps  10-15       Discharge Exercise Prescription (Final Exercise Prescription Changes): Exercise Prescription Changes - 06/17/18 1300      Response to Exercise   Blood Pressure (Admit)  108/60    Blood Pressure (Exercise)  144/60    Blood Pressure (Exit)  106/60    Heart Rate (Admit)  75 bpm    Heart Rate (Exercise)  108 bpm    Heart Rate (Exit)  76 bpm    Oxygen Saturation (Admit)  97 %    Oxygen Saturation (Exercise)  96 %    Oxygen Saturation (Exit)  97 %    Rating of Perceived Exertion (Exercise)  13    Perceived Dyspnea (Exercise)  2    Duration  Progress to 45 minutes of aerobic exercise without  signs/symptoms of physical distress    Intensity  THRR unchanged      Progression   Progression  Continue to progress workloads to maintain intensity without signs/symptoms of physical distress.      Resistance Training   Training Prescription  Yes    Weight  blue bands    Reps  10-15    Time  10 Minutes      Interval Training   Interval Training  No      Bike   Level  5    Minutes  17      NuStep   Level  5    SPM  80    Minutes  17    METs  1.6      Track   Laps  12    Minutes  17       Functional Capacity: 6 Minute Walk    Row Name 03/27/18 0724 06/19/18 1630       6 Minute Walk   Phase  Initial  Discharge    Distance  1194 feet  1200 feet    Distance Feet Change  -  6 ft    Walk Time  6 minutes  6 minutes    # of Rest Breaks  0  0    MPH  2.26  2.27    METS  2.76  2.76    RPE  12  12    Perceived Dyspnea   2  2    Symptoms  Yes (comment)  Yes (comment)    Comments  4/10 calf pain  6/10 claudication pain    Resting HR  57 bpm  62 bpm    Resting BP  126/69  -    Resting Oxygen Saturation   98 %  97 %    Exercise Oxygen Saturation  during 6 min walk  94 %  95 %    Max Ex. HR  74 bpm  87 bpm    Max Ex. BP  192/67  100/64    2 Minute Post BP  152/78  -      Interval HR   1 Minute HR  63  81    2 Minute HR  73  91    3  Minute HR  71  85    4 Minute HR  74  87    5 Minute HR  73  77    6 Minute HR  70  77    2 Minute Post HR  57  71    Interval Heart Rate?  Yes  Yes      Interval Oxygen   Interval Oxygen?  -  Yes    Baseline Oxygen Saturation %  98 %  97 %    1 Minute Oxygen Saturation %  96 %  96 %    1 Minute Liters of Oxygen  0 L  0 L    2 Minute Oxygen Saturation %  95 %  95 %    2 Minute Liters of Oxygen  0 L  0 L    3 Minute Oxygen Saturation %  96 %  98 %    3 Minute Liters of Oxygen  0 L  0 L    4 Minute Oxygen Saturation %  94 %  98 %    4 Minute Liters of Oxygen  0 L  0 L    5 Minute Oxygen Saturation %  95 %  98 %    5 Minute Liters of Oxygen  0 L  0 L    6 Minute Oxygen Saturation %  94 %  97 %    6 Minute Liters of Oxygen  0 L  0 L    2 Minute Post Oxygen Saturation %  98 %  99 %    2 Minute Post Liters of Oxygen  0 L  0 L       Psychological, QOL, Others - Outcomes: PHQ 2/9: Depression screen [facility] 2/9 06/20/2018 03/21/2018 11/18/2017 11/04/2017  Decreased Interest 0 0 0 0  Down, Depressed, Hopeless 0 0 0 0  PHQ - 2 Score 0 0 0 0  Altered sleeping 0 0 - -  Tired, decreased energy 0 1 - -  Change in appetite 0 0 - -  Trouble concentrating 0 0 - -  Moving slowly or fidgety/restless 0 0 - -  Suicidal thoughts 0 0 - -  PHQ-9 Score 0 1 - -  Difficult doing work/chores Not difficult at all Not difficult at all - -  Some recent data might be hidden    Quality of Life:   Personal Goals: Goals established at orientation with interventions provided to work toward goal. Personal Goals and Risk Factors at Admission - 03/21/18 1007      Core Components/Risk Factors/Patient Goals on Admission    Weight Management  Weight Loss;Yes    Intervention  Weight Management: Provide education and appropriate resources to help participant work on and attain dietary goals.;Weight Management: Develop a combined nutrition and exercise program designed to reach desired caloric intake, while  maintaining appropriate intake of nutrient and fiber, sodium and fats, and appropriate energy expenditure required for the weight goal.;Weight Management/Obesity: Establish reasonable short term and long term weight goals.;Obesity: Provide education and appropriate resources to help participant work on and attain dietary goals.    Admit Weight  202 lb 6.1 oz (91.8 kg)    Goal Weight: Short Term  195 lb (88.5 kg)    Goal Weight: Long Term  190 lb (86.2 kg)    Expected Outcomes  Short Term: Continue to assess and modify interventions until short term weight is achieved;Long Term: Adherence to nutrition and physical activity/exercise program aimed toward attainment of established weight goal;Weight Loss: Understanding of general recommendations for a balanced deficit meal plan, which promotes 1-2 lb weight loss per week and includes a negative energy balance of [phone] kcal/d;Understanding recommendations for meals to include 15-35% energy as protein, 25-35% energy from fat, 35-60% energy from carbohydrates, less than 210m of dietary cholesterol, 20-35 gm of total fiber daily;Understanding of distribution of calorie intake throughout the day with the consumption of 4-5 meals/snacks    Improve shortness of breath with ADL's  Yes    Intervention  Provide education, individualized exercise plan and daily activity instruction to help decrease symptoms of SOB with activities of daily living.    Expected Outcomes  Short Term: Improve cardiorespiratory fitness to achieve a reduction of symptoms when performing ADLs;Long Term: Be able to perform more ADLs without symptoms or delay the onset of symptoms    Heart Failure  Yes    Intervention  Provide a combined exercise and nutrition program that is supplemented with education, support and counseling about heart failure. Directed toward relieving symptoms such as shortness of breath, decreased exercise tolerance, and extremity edema.    Expected Outcomes  Improve  functional capacity of life;Short term: Attendance in program 2-3 days a  week with increased exercise capacity. Reported lower sodium intake. Reported increased fruit and vegetable intake. Reports medication compliance.;Long term: Adoption of self-care skills and reduction of barriers for early signs and symptoms recognition and intervention leading to self-care maintenance.    Hypertension  Yes    Intervention  Provide education on lifestyle modifcations including regular physical activity/exercise, weight management, moderate sodium restriction and increased consumption of fresh fruit, vegetables, and low fat dairy, alcohol moderation, and smoking cessation.;Monitor prescription use compliance.    Expected Outcomes  Short Term: Continued assessment and intervention until BP is < 140/59m HG in hypertensive participants. < 130/859mHG in hypertensive participants with diabetes, heart failure or chronic kidney disease.;Long Term: Maintenance of blood pressure at goal levels.    Lipids  Yes    Intervention  Provide education and support for participant on nutrition & aerobic/resistive exercise along with prescribed medications to achieve LDL <7034mHDL >63m82m  Expected Outcomes  Short Term: Participant states understanding of desired cholesterol values and is compliant with medications prescribed. Participant is following exercise prescription and nutrition guidelines.;Long Term: Cholesterol controlled with medications as prescribed, with individualized exercise RX and with personalized nutrition plan. Value goals: LDL < 70mg53mL > 40 mg.        Personal Goals Discharge: Goals and Risk Factor Review    Row Name 04/01/18 1311 04/21/18 1631 04/23/18 1153 05/20/18 1653 05/21/18 0934     Core Components/Risk Factors/Patient Goals Review   Personal Goals Review  Weight Management/Obesity;Improve shortness of breath with ADL's;Heart Failure;Hypertension;Lipids  Weight Management/Obesity;Improve shortness  of breath with ADL's;Heart Failure  -  Weight Management/Obesity;Improve shortness of breath with ADL's;Heart Failure;Develop more efficient breathing techniques such as purse lipped breathing and diaphragmatic breathing and practicing self-pacing with activity.;Increase knowledge of respiratory medications and ability to use respiratory devices properly.  -   Review  First day of exercise today, did well, too early to see progression toward outcomes/goals  Pt has completed 4 exercise sessions.  Pt with fair attendance and pt returns after discharge from pulmonary rehab earlier this year.  Pt with slight weight loss of .3kg since starting on 7/2. Pt reprorts his shortness of breath has not improved.  Hopefully with consistent attendance during the next 30 days, will be able to see some progress.  Pt with bp and lipids within normal limits will resolve these two patietnt goalls.  Pt has completed 4 exercise sessions.  Pt with fair attendance and pt returns after discharge from pulmonary rehab earlier this year.  Pt with slight weight loss of .3kg since starting on 7/2. Pt reprorts his shortness of breath has not improved.  Hopefully with consistent attendance during the next 30 days, will be able to see some progress. Presently pt is on level 4 for nustep and sci fit bike, averages 9-11 laps on the track.  Pt with bp and lipids within normal limits will resolve these two patietnt goalls.  Pt has completed 11 exercise sessions.  Pt with fair attendance.  Pt with slight weight loss of .2kg since starting on 7/2. Pt does not routinely weigh himself at home, he goes on how he feels. Presently pt remains on  level 4 for nustep and sci fit bike level 5, averages 10-13 laps on the track.. Continue to monitor pt progress during the next 30 days.  -   Expected Outcomes  see admission goals  see admission goals  see admission goals/outcomes  see admission goals/outcomes  See Admission Goals/Outcomes  [place] Name 06/18/18 1426  08/17/18 1853           Core Components/Risk Factors/Patient Goals Review   Personal Goals Review  Weight Management/Obesity;Improve shortness of breath with ADL's;Heart Failure;Develop more efficient breathing techniques such as purse lipped breathing and diaphragmatic breathing and practicing self-pacing with activity.;Increase knowledge of respiratory medications and ability to use respiratory devices properly.  Weight Management/Obesity;Improve shortness of breath with ADL's;Heart Failure;Develop more efficient breathing techniques such as purse lipped breathing and diaphragmatic breathing and practicing self-pacing with activity.;Increase knowledge of respiratory medications and ability to use respiratory devices properly.      Review  Pt has completed 18 exercise sessions and plans to graduate on 9/19.  Pt with weight gain of .2kg since initial exercise session. Pt does not routinely weigh himself at home, he goes on how he feels. Presently pt increased to level 5 for nustep and sci fit bike remains at level 5, averages 10-13 laps on the track.   Pt graduates with the completion of 19 exercise sessions.  Pt plans to continue exercsei with walking      Expected Outcomes  See Admission Goals/Outcomes  Pt content with his progress toward goals.         Exercise Goals and Review: Exercise Goals    Row Name 03/21/18 1017             Exercise Goals   Increase Physical Activity  Yes       Intervention  Provide advice, education, support and counseling about physical activity/exercise needs.;Develop an individualized exercise prescription for aerobic and resistive training based on initial evaluation findings, risk stratification, comorbidities and participant's personal goals.       Expected Outcomes  Short Term: Attend rehab on a regular basis to increase amount of physical activity.;Long Term: Add in home exercise to make exercise part of routine and to increase amount of physical  activity.;Long Term: Exercising regularly at least 3-5 days a week.       Increase Strength and Stamina  Yes       Intervention  Provide advice, education, support and counseling about physical activity/exercise needs.;Develop an individualized exercise prescription for aerobic and resistive training based on initial evaluation findings, risk stratification, comorbidities and participant's personal goals.       Expected Outcomes  Short Term: Increase workloads from initial exercise prescription for resistance, speed, and METs.;Short Term: Perform resistance training exercises routinely during rehab and add in resistance training at home;Long Term: Improve cardiorespiratory fitness, muscular endurance and strength as measured by increased METs and functional capacity (6MWT)       Able to understand and use rate of perceived exertion (RPE) scale  Yes       Intervention  Provide education and explanation on how to use RPE scale       Expected Outcomes  Short Term: Able to use RPE daily in rehab to express subjective intensity level;Long Term:  Able to use RPE to guide intensity level when exercising independently       Able to understand and use Dyspnea scale  Yes       Intervention  Provide education and explanation on how to use Dyspnea scale       Expected Outcomes  Short Term: Able to use Dyspnea scale daily in rehab to express subjective sense of shortness of breath during exertion;Long Term: Able to use Dyspnea scale to guide intensity level when exercising independently       Knowledge and understanding of  Target Heart Rate Range (THRR)  Yes       Intervention  Provide education and explanation of THRR including how the numbers were predicted and where they are located for reference       Expected Outcomes  Short Term: Able to state/look up THRR;Long Term: Able to use THRR to govern intensity when exercising independently;Short Term: Able to use daily as guideline for intensity in rehab        Understanding of Exercise Prescription  Yes       Intervention  Provide education, explanation, and written materials on patient's individual exercise prescription       Expected Outcomes  Short Term: Able to explain program exercise prescription;Long Term: Able to explain home exercise prescription to exercise independently          Exercise Goals Re-Evaluation: Exercise Goals Re-Evaluation    Row Name 03/28/18 1054 04/21/18 0956 05/19/18 1353 06/16/18 1410       Exercise Goal Re-Evaluation   Exercise Goals Review  Increase Strength and Stamina;Increase Physical Activity;Able to understand and use rate of perceived exertion (RPE) scale;Knowledge and understanding of Target Heart Rate Range (THRR);Understanding of Exercise Prescription;Able to understand and use Dyspnea scale;Able to check pulse independently  Increase Strength and Stamina;Increase Physical Activity;Able to understand and use rate of perceived exertion (RPE) scale;Knowledge and understanding of Target Heart Rate Range (THRR);Understanding of Exercise Prescription;Able to understand and use Dyspnea scale  Increase Strength and Stamina;Increase Physical Activity;Able to understand and use rate of perceived exertion (RPE) scale;Knowledge and understanding of Target Heart Rate Range (THRR);Understanding of Exercise Prescription;Able to understand and use Dyspnea scale  Increase Strength and Stamina;Increase Physical Activity;Able to understand and use rate of perceived exertion (RPE) scale;Knowledge and understanding of Target Heart Rate Range (THRR);Understanding of Exercise Prescription;Able to understand and use Dyspnea scale    Comments  Patient will restart Pulmonary Rehab on 04/01/18. Will monitor and motivate as tolerated.   Patient has only completed 4 rehab sessions. Will cont. to monitor and progress as able.  Patient is able to walk 13 laps (200 ft each) in 15 minutes. Patient is open to new workloads but is limited by his  claudication pain. MET average places him in a low level. Will cont. to monitor and progress as able.  Patient is able to walk 11 laps (200 ft each) in 15 minutes. Patient is open to new workloads but is limited by his claudication pain. MET average places him in a low level.  Patient will be graduating soon-will encourage exercise at home or staying in our maintenance program. Will cont. to monitor and progress as able.    Expected Outcomes  Through exercise at rehab and at home, the patient will decrease shortness of breath with daily activities and feel confident in carrying out an exercise regime at home.   Through exercise at rehab and at home, the patient will decrease shortness of breath with daily activities and feel confident in carrying out an exercise regime at home.   Through exercise at rehab and at home, the patient will decrease shortness of breath with daily activities and feel confident in carrying out an exercise regime at home.   Through exercise at rehab and at home, the patient will decrease shortness of breath with daily activities and feel confident in carrying out an exercise regime at home.        Nutrition & Weight - Outcomes:    Nutrition: Nutrition Therapy & Goals - 08/08/18 1428  Nutrition Therapy   Diet  Heart Healthy      Personal Nutrition Goals   Nutrition Goal  Pt to identify and limit food sources of sodium.      Intervention Plan   Intervention  Prescribe, educate and counsel regarding individualized specific dietary modifications aiming towards targeted core components such as weight, hypertension, lipid management, diabetes, heart failure and other comorbidities.    Expected Outcomes  Short Term Goal: Understand basic principles of dietary content, such as calories, fat, sodium, cholesterol and nutrients.;Long Term Goal: Adherence to prescribed nutrition plan.       Nutrition Discharge: Nutrition Assessments - 06/13/18 0811      Rate Your Plate  Scores   Pre Score  42    Post Score  30       Education Questionnaire Score: Knowledge Questionnaire Score - 06/18/18 1516      Knowledge Questionnaire Score   Pre Score  17/18       Goals reviewed with patient. Cherre Huger, BSN Cardiac and Training and development officer

## 2018-08-18 ENCOUNTER — Other Ambulatory Visit: Payer: Self-pay | Admitting: Internal Medicine

## 2018-08-18 MED ORDER — FLUOXETINE HCL 40 MG PO CAPS: 40.0000 mg | ORAL_CAPSULE | Freq: Every day | ORAL | 0 refills | Status: DC

## 2018-08-18 NOTE — Telephone Encounter (Signed)
Spoke with pt. Pt okay with increasing RX. 40 mg sent to [facility] for 30 day supply. Pt advised to contact office if any side effects.

## 2018-08-18 NOTE — Telephone Encounter (Signed)
His wife sent a message regarding increasing his prozac -- ok to increase to 40mg  if he wants -- new rx pending - just confirm with him -- call him

## 2018-08-25 ENCOUNTER — Encounter (HOSPITAL_COMMUNITY): Payer: Commercial Managed Care - PPO | Admitting: Cardiology

## 2018-08-25 ENCOUNTER — Ambulatory Visit (INDEPENDENT_AMBULATORY_CARE_PROVIDER_SITE_OTHER): Payer: Commercial Managed Care - PPO

## 2018-08-25 DIAGNOSIS — I480 Paroxysmal atrial fibrillation: Secondary | ICD-10-CM

## 2018-08-25 NOTE — Progress Notes (Signed)
Carelink Summary Report / Loop Recorder 

## 2018-08-26 ENCOUNTER — Other Ambulatory Visit: Payer: Commercial Managed Care - PPO

## 2018-08-26 ENCOUNTER — Ambulatory Visit: Payer: Commercial Managed Care - PPO | Admitting: Oncology

## 2018-08-27 ENCOUNTER — Telehealth: Payer: Self-pay

## 2018-08-27 ENCOUNTER — Telehealth: Payer: Self-pay | Admitting: Oncology

## 2018-08-27 ENCOUNTER — Other Ambulatory Visit (INDEPENDENT_AMBULATORY_CARE_PROVIDER_SITE_OTHER): Payer: Commercial Managed Care - PPO

## 2018-08-27 ENCOUNTER — Telehealth: Payer: Self-pay | Admitting: Internal Medicine

## 2018-08-27 DIAGNOSIS — E0590 Thyrotoxicosis, unspecified without thyrotoxic crisis or storm: Secondary | ICD-10-CM

## 2018-08-27 LAB — SEDIMENTATION RATE: Sed Rate: 47 mm/h — ABNORMAL HIGH (ref 0–20)

## 2018-08-27 LAB — T4, FREE: Free T4: 3.84 ng/dL — ABNORMAL HIGH (ref 0.60–1.60)

## 2018-08-27 LAB — T3, FREE: T3, Free: 5.6 pg/mL — ABNORMAL HIGH (ref 2.3–4.2)

## 2018-08-27 LAB — TSH: TSH: <0.01 u[IU]/mL — ABNORMAL LOW (ref 0.35–4.50)

## 2018-08-27 NOTE — Telephone Encounter (Signed)
Once we have the results, we will fax them.

## 2018-08-27 NOTE — Telephone Encounter (Signed)
Tried to reach regarding 1/16 no voicemail came on

## 2018-08-27 NOTE — Telephone Encounter (Signed)
Patient's wife Bella Kennedy ph# [phone] called re: requests lab results on patient's thyroid be faxed asap to Dr. Albina BilletHassan Rowan fax# [phone]. Dr. Dema Severin needs lab results before patient can be scheduled to have his surgery.

## 2018-08-27 NOTE — Telephone Encounter (Signed)
TC from Pt's wife inquiring about appointment on 08/29/18. Appointment time was not correct. After looking in chart appointment was canceled by Dr. Benay Spice. Dr. Benay Spice stated appointment was set for Pt. To review pathology report, which Pt did not have surgery yet. Pt's wife verbalized understanding that appointment was canceled. And instructed to call if any other problems or concerns.

## 2018-08-29 ENCOUNTER — Ambulatory Visit: Payer: Commercial Managed Care - PPO | Admitting: Oncology

## 2018-08-29 ENCOUNTER — Other Ambulatory Visit: Payer: Commercial Managed Care - PPO

## 2018-08-29 ENCOUNTER — Inpatient Hospital Stay: Payer: Commercial Managed Care - PPO

## 2018-08-29 ENCOUNTER — Inpatient Hospital Stay: Payer: Commercial Managed Care - PPO | Admitting: Oncology

## 2018-09-01 LAB — THYROID STIMULATING IMMUNOGLOBULIN

## 2018-09-01 MED ORDER — METHIMAZOLE 10 MG PO TABS: ORAL_TABLET | ORAL | 6 refills | Status: DC

## 2018-09-01 MED ORDER — PREDNISONE 5 MG PO TABS: 5.0000 mg | ORAL_TABLET | Freq: Every day | ORAL | 3 refills | Status: DC

## 2018-09-01 NOTE — Telephone Encounter (Signed)
Results in and faxed.  Dr. Cruzita Lederer please review for patient, they called this morning for results.

## 2018-09-01 NOTE — Addendum Note (Signed)
Addended by: Philemon Kingdom on: 09/01/2018 12:24 PM   Modules accepted: Orders

## 2018-09-01 NOTE — Telephone Encounter (Signed)
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  The treatment for hyperthyroidism is methimazole, while the treatment for thyroiditis is a strong anti-inflammatory agent like prednisone.  I know that they are very concerned about the colon cancer, however, delaying the surgery would not be as dangerous for him as going under anesthesia while his thyroid are overreacting.    Unfortunately, I cannot clear him for surgery right now from the thyroid point of view as much as I would want to.

## 2018-09-01 NOTE — Telephone Encounter (Signed)
Not all are back but the ones that are back are a little worse.  Please advise him to start Prednisone 5 mg daily (sent Rx0 and increase MMI to 10 mg in am and 5 mg in pm.  I do not feel it is safe to proceed with surgery for now. I let Dr. Dema Severin know - fwd'ed him my note.  Will need labs in 1 mo >> labs are in.

## 2018-09-01 NOTE — Telephone Encounter (Signed)
Spoke to patient's wife and gave message, she is very concerned about not having the surgery due to him having colon cancer, she would also like to know what the prednisone will do? How can they be more aggressive treating this, she feels it is very urgent that he get surgery.  Please call [phone]

## 2018-09-02 NOTE — Telephone Encounter (Signed)
Spoke with the patients wife and gave her MD advice and I scheduled him for f/u labs 09/26/18- she would like to know if there is anything more aggressive he can take so that he can be cleared for surgery sooner please advise

## 2018-09-02 NOTE — Telephone Encounter (Signed)
Unfortunately, no. Pushing him into hypothyroidism will also not be acceptable for surgery. I d/w Dr. Dema Severin (his surgeon) and he is aware and agrees with the plan.

## 2018-09-02 NOTE — Telephone Encounter (Signed)
LMTCB to discuss MD advice

## 2018-09-02 NOTE — Telephone Encounter (Signed)
Made wife aware that this is the only treatment option and she stated an understanding

## 2018-09-12 ENCOUNTER — Telehealth: Payer: Self-pay | Admitting: Internal Medicine

## 2018-09-12 ENCOUNTER — Telehealth (HOSPITAL_COMMUNITY): Payer: Self-pay

## 2018-09-12 NOTE — Telephone Encounter (Signed)
° ° °  Patient's spouse calling to report patient had dizziness, nausea and vertigo on 12/12 lasting about 45 minutes. No symptoms today. Requesting review of loop recorder readings from yesterday. Please call

## 2018-09-12 NOTE — Telephone Encounter (Signed)
Spoke with wife and advised of response below. She expressed understanding.

## 2018-09-12 NOTE — Telephone Encounter (Signed)
Agree, if his weight is down he can hold lasix for 1-2 days.  EP to review Loop Recorder to look for arrythmias. Otherwise, should follow with PCP.     Legrand Como [address] [place], PA-C 09/12/2018 9:31 AM

## 2018-09-12 NOTE — Telephone Encounter (Signed)
If recurs - needs to be seen

## 2018-09-12 NOTE — Telephone Encounter (Signed)
Pt's wife called to report that pt had an episode on 12/12 of nausea, vertigo, weakness, dizziness, and fatigue that lasted around 45 minutes. I advised pt's wife to call pt's PCP to set up an appt.

## 2018-09-12 NOTE — Telephone Encounter (Signed)
Copied from [place] [id number]. Topic: Quick Communication - See Telephone Encounter >> Sep 12, 2018  2:43 PM Vernona Rieger wrote: CRM for notification. See Telephone encounter for: 09/12/18.  Patient's wife, [name] called to report that pt had an episode on 12/12 of nausea, vertigo, weakness, dizziness, and fatigue that lasted around 45 minutes. Patient is fine today and has also reported this to Cardiology, they advised her to call his pcp to see what he should further do. The cardiologist's office advised her they would call to make him an appointment, but then called her to let her know that it was not his afib. Please advise.

## 2018-09-12 NOTE — Telephone Encounter (Signed)
Remote report shows no episodes of AF currently. Last episode was noted on Oct 21. Current AF burden from Oct 11-Dec 13 is 4.1%.  Patient was made aware of his remote transmission results. I advised him to discuss his symptoms with his PCP.  Pt has verbalized understanding and had no additional questions.

## 2018-09-14 ENCOUNTER — Other Ambulatory Visit (HOSPITAL_COMMUNITY): Payer: Self-pay | Admitting: Cardiology

## 2018-09-14 DIAGNOSIS — R0602 Shortness of breath: Secondary | ICD-10-CM

## 2018-09-14 DIAGNOSIS — I509 Heart failure, unspecified: Secondary | ICD-10-CM

## 2018-09-21 ENCOUNTER — Other Ambulatory Visit (HOSPITAL_COMMUNITY): Payer: Self-pay | Admitting: Cardiology

## 2018-09-25 ENCOUNTER — Ambulatory Visit: Payer: Commercial Managed Care - PPO

## 2018-09-26 ENCOUNTER — Other Ambulatory Visit (INDEPENDENT_AMBULATORY_CARE_PROVIDER_SITE_OTHER): Payer: Commercial Managed Care - PPO

## 2018-09-26 DIAGNOSIS — E0590 Thyrotoxicosis, unspecified without thyrotoxic crisis or storm: Secondary | ICD-10-CM

## 2018-09-26 LAB — CUP PACEART REMOTE DEVICE CHECK
Date Time Interrogation Session: 20191227043819
Implantable Pulse Generator Implant Date: 20161111

## 2018-09-26 LAB — T3, FREE: T3, Free: 3.9 pg/mL (ref 2.3–4.2)

## 2018-09-26 LAB — T4, FREE: Free T4: 2.64 ng/dL — ABNORMAL HIGH (ref 0.60–1.60)

## 2018-09-26 LAB — TSH: TSH: <0.01 u[IU]/mL — ABNORMAL LOW (ref 0.35–4.50)

## 2018-09-29 ENCOUNTER — Telehealth: Payer: Self-pay | Admitting: Internal Medicine

## 2018-09-29 ENCOUNTER — Telehealth: Payer: Self-pay

## 2018-09-29 DIAGNOSIS — E0590 Thyrotoxicosis, unspecified without thyrotoxic crisis or storm: Secondary | ICD-10-CM

## 2018-09-29 NOTE — Telephone Encounter (Signed)
-----   Message from Philemon Kingdom, MD sent at 09/26/2018  5:08 PM EST ----- [name], I sent a message through epic to Dr. Dema Severin.  Just in case, can you also please fax the above labs to him along with my message:  Dr. Albina BilletHassan Rowan fax# [phone]

## 2018-09-29 NOTE — Telephone Encounter (Signed)
Patients wife is requesting a call back for the patients lab results.

## 2018-09-29 NOTE — Telephone Encounter (Signed)
Duplicate encounter, see open call today.

## 2018-09-29 NOTE — Telephone Encounter (Signed)
Lab results faxed to Dr. Dema Severin.  Future orders placed.

## 2018-09-29 NOTE — Telephone Encounter (Signed)
-----   Message from Philemon Kingdom, MD sent at 09/26/2018  5:02 PM EST ----- [name], can you please call pt: The free thyroid hormone levels have improved, but 1 of them is still above target.  I think at this point, it would be a little safer to proceed with the surgery if the anesthesiologist is comfortable with these results.  However, ideally, I would suggest to continue the same regimen for now and recheck in another 4 to 5 weeks.  Can you please order these?

## 2018-10-03 ENCOUNTER — Telehealth: Payer: Self-pay

## 2018-10-03 NOTE — Telephone Encounter (Signed)
-----   Message from Philemon Kingdom, MD sent at 10/02/2018  5:23 PM EST ----- Ambyr Qadri, as you see, Dr. Dema Severin also prefers to wait another 4 to 5 weeks.  Let us have him back for labs then.  Labs are in.

## 2018-10-03 NOTE — Telephone Encounter (Signed)
LMTCB to schedule labs

## 2018-10-12 LAB — CUP PACEART REMOTE DEVICE CHECK
Date Time Interrogation Session: 20191124033636
Implantable Pulse Generator Implant Date: 20161111

## 2018-10-13 NOTE — Telephone Encounter (Signed)
Lab appt made

## 2018-10-14 ENCOUNTER — Encounter (HOSPITAL_COMMUNITY): Payer: Self-pay | Admitting: Cardiology

## 2018-10-14 ENCOUNTER — Ambulatory Visit (HOSPITAL_COMMUNITY)
Admission: RE | Admit: 2018-10-14 | Discharge: 2018-10-14 | Disposition: A | Discharge status: Home / Self Care | Payer: Commercial Managed Care - PPO | Source: Ambulatory Visit | Attending: Cardiology | Admitting: Cardiology

## 2018-10-14 VITALS — BP 158/70 | HR 60 | Wt 189.2 lb

## 2018-10-14 DIAGNOSIS — R9431 Abnormal electrocardiogram [ECG] [EKG]: Secondary | ICD-10-CM | POA: Diagnosis not present

## 2018-10-14 DIAGNOSIS — I10 Essential (primary) hypertension: Secondary | ICD-10-CM | POA: Diagnosis not present

## 2018-10-14 DIAGNOSIS — I480 Paroxysmal atrial fibrillation: Secondary | ICD-10-CM | POA: Diagnosis not present

## 2018-10-14 DIAGNOSIS — R001 Bradycardia, unspecified: Secondary | ICD-10-CM | POA: Insufficient documentation

## 2018-10-14 DIAGNOSIS — I5032 Chronic diastolic (congestive) heart failure: Secondary | ICD-10-CM | POA: Diagnosis present

## 2018-10-14 LAB — BASIC METABOLIC PANEL
Anion gap: 9 (ref 5–15)
BUN: 15 mg/dL (ref 8–23)
CO2: 23 mmol/L (ref 22–32)
CREATININE: 0.92 mg/dL (ref 0.61–1.24)
Calcium: 9 mg/dL (ref 8.9–10.3)
Chloride: 107 mmol/L (ref 98–111)
GFR calc Af Amer: >60 mL/min (ref >60)
Glucose, Bld: 159 mg/dL — ABNORMAL HIGH (ref 70–99)
Potassium: 4.3 mmol/L (ref 3.5–5.1)
Sodium: 139 mmol/L (ref 135–145)

## 2018-10-14 LAB — T4, FREE: Free T4: 1.71 ng/dL (ref 0.82–1.77)

## 2018-10-14 LAB — TSH: TSH: <0.01 u[IU]/mL — ABNORMAL LOW (ref 0.350–4.500)

## 2018-10-14 MED ORDER — AMLODIPINE BESYLATE 5 MG PO TABS: 5.0000 mg | ORAL_TABLET | Freq: Every day | ORAL | 11 refills | Status: DC

## 2018-10-14 NOTE — Patient Instructions (Addendum)
START Amlodipine 5mg  (1 tab) daily  Labs today We will only contact you if something comes back abnormal or we need to make some changes. Otherwise no news is good news!  Your physician recommends that you schedule a follow-up appointment in: 3 months with [name]

## 2018-10-15 ENCOUNTER — Telehealth (HOSPITAL_COMMUNITY): Payer: Self-pay

## 2018-10-15 LAB — T3, FREE: T3, Free: 3.1 pg/mL (ref 2.0–4.4)

## 2018-10-15 NOTE — Telephone Encounter (Signed)
-----   Message from Larey Dresser, MD sent at 10/14/2018 10:09 PM EST ----- TSH remains low but free T4 is now normal (was elevated 2 wks ago).  Still pending free T3.  Please call patient with results.

## 2018-10-15 NOTE — Progress Notes (Signed)
Patient ID: Troy Beneke [name], male   DOB: January 18, 1941, 78 y.o.   MRN: [id number] PCP: Dr. Quay Burow Cardiology: [name]  78 y.o. with history of HTN, COPD, active smoking/COPD, carotid stenosis s/p right CEA, paroxysmal atrial fibrillation, and PAD returns for followup of CHF and atrial fibrillation.  He does not have known obstructive CAD but is at high risk for CAD based on his comorbidities.  Lexiscan Cardiolite in 3/14 showed no ischemia or infarction and echo in 3/14 showed normal EF.  He had a left fem-pop bypass at the [facility] in the '90s.  He is followed at [facility] for PAD. He has chronic right calf/thigh/buttocks claudication that is unchanged over the last few years and follows regularly at [facility].  He has had right lower lobectomy for lung cancer.  He continues to stay off cigarettes.    At a prior appointment, he was in atrial fibrillation but did not realize it.  I started him on Eliquis and diltiazem CD, and he spontaneously converted to NSR.  In 5/15, he was at [facility] and felt "strange" one day: fatigued, weak, short of breath.  He went to the ER and was in atrial fibrillation with HR in 80s-90s.  He spontaneously converted to NSR in the ER.  He felt back to normal after converting to NSR.  I started him on Multaq 400 mg bid.  With CHF, he was eventually transitioned over to amiodarone.   He had patch angioplasty revision of left fem-pop bypass in 11/16.  Now with minimal claudication.  He follows with [facility].     He had a Cardiolite and echo in 7/17 that were unremarkable.  Repeat Cardiolite in 12/17 showed no ischemia, echo was uninterpretable.  Cardiac MRI was therefore done in 1/18, showing EF 66% with normal-appearing RV, no late gadolinium enhancement.   Given increased exertional dyspnea and a defect on Cardiolite, he had LHC in 11/18.  This showed no obstructive CAD. He had a chest CT done given concern for possible amiodarone lung toxicity with increased dyspnea. This showed emphysema,  no ILD.    In 5/19, he developed a probable viral syndrome with dehydration and had a presyncopal episode.  He was orthostatic in the hospital.  He was noted to be in atrial fibrillation this admission which remained persistent.  He had a TEE-guided DCCV back to NSR.   In 9/19, he was admitted with GI bleeding and found to have a sigmoid mass on colonoscopy, path showed sigmoid adenocarcinoma.  He was noted to have right paratracheal lymphadenopathy but EUS with biopsy in 10/19 did not show malignancy.  Eliquis was stopped with bleeding sigmoid mass.  He was noted to be back in atrial fibrillation this admission.   Amiodarone was stopped with elevated ESR and increased dyspnea.  He was also found to have new hyperthyroidism. He has been started on methimazole and following with endocrinology.  Colon cancer surgery has been on hold while hyperthyroidism is controlled.   He is feeling better today.  He is back in NSR.  Surgery now scheduled for 11/13/18.  No exertional dyspnea but not very active. No chest pain.  No lightheadedness.   ECG (personally reviewed): NSR at 55, nonspecific T wave flattening, QTc 444 msec  Labs (3/13): LDL 75, HDL 35, K 4.1, creatinine 1.1 Labs (3/14): LDL 91, HDL 27 Labs (10/14): K 4.3, creatinine 0.98 Labs (4/15): K 4.9, creatinine 1.1, LDL 76, HDL 30 Labs (5/15): K 4.3, creatinine 1.7, BNP 251 Labs (  6/15): K 4.4, creatinine 1.3 Labs (10/15): TSH normal Labs (5/16): K 4.2, creatinine 1.12, HCT 39.5, LFTs normal, LDL 84, HDL 43, TSH normal Labs (11/16): creatinine 0.94 Labs (4/17): LDL 39, HDL 39 Labs (6/17): K 4.5, creatinine 1.2 Labs (9/17): K 4, creatinine 1.25, HCT 38.7 Labs (12/17): K 4.5, creatinine 1.49 => 1.62, BNP 43 Labs (2/18): K 3.8, creatinine 1.33, LFTs normal, TSH normal Labs (6/18): K 4.3, creatinine 1.49, LFTs normal, TSH normal, hgb 12.3 Labs (11/18): ESR 60, TSH normal, LFTs normal, creatinine 1.34 Labs (1/19): LDL 50 Labs (5/19): K 4.6,  creatinine 1.55, LFTs normal, hgb 12.7, LFTs normal Labs (10/19): K 4 => 4.1, creatinine 1.39 => 1.43, BNP 279, hgb 10.7, TSH low, free T4 and free T3 low, ESR 108  PMH: 1. HTN: Fatigue and cough with ramipril use.  2. COPD: Quit smoking 2014. PFTs (1/18) with moderately severe COPD.  - CT chest (11/18): Emphysema noted, no ILD.  3. AAA: CT 1/13 with 3.0 cm AAA.  Abdominal US (1/14) with 3.4 cm AAA. Abdominal US (4/15) with 3.25 x 3.27 AAA. Abdominal US (3/16) with 3.7 cm AAA.  Followed at [facility].  - Abd [name] (1/19): 3.3 cm AAA. 4. Squamous cell lung cancer diagnosed 2/12.  Had right lower lobectomy in 4/12.  5. Hyperlipidemia 6. PAD: Left fem-pop bypass 1994.  ABIs (2/12) 0.62 on right, 0.95 on left. ABIs (1/14): 0.65 on right, 1.04 on left. ABIs (4/15) 0.66 right, 0.87 left.  ABIs (3/16) 0.6 right, 0.88 left.  Patch angioplasty left fem-pop bypass in 11/16.  - Left fem-pop bypass patent on doppler evaluation in 12/17.  7. Stress myoview 2004 was normal. Lexiscan Cardiolite (3/14) with EF 66%, no ischemia or infarction. Lexiscan Cardiolite (7/17) with EF 61%, no ischemia/infarction.  - Cardiolite (12/17) with EF 62%, inferior/inferolateral fixed defect, most likely diaphragmatic attenuation, no ischemia.  - Cardiolite (9/18) with EF > 65%, fixed inferior defect (attenuation versus infarction), no ischemia.  - LHC (11/18): No obstructive CAD.  8. Chronic diastolic CHF: Echo (6/37): technically difficult with EF 55-60%, upper normal RV size.  Echo (5/16) with EF 60-65%.  Echo (7/17) with EF 55-60%, normal RV size and systolic function.  - Echo 12/17 with very poor windows, unable to comment on LV or RV function.  - Cardiac MRI (1/18) with EF 66%, normal RV size and systolic function.  - TEE (5/19): EF 55-60%, normal RV size and systolic function.  9. Carotid stenosis: TIA 10/14.  Carotid dopplers with > 85% RICA, 88-50% LICA.  Patient had right CEA in 10/14. Carotids (4/15) patent right CEA, LICA  27-74% stenosis.  Carotids (12/87) < 86% LICA, patent right CEA.  - Carotid dopplers (12/17): right CEA ok, < 76% LICA stenosis.  - Carotid dopplers (1/19): Right CEA ok, 7-20% LICA stenosis.  10. Atrial fibrillation: Paroxysmal. First noted after lobectomy in 4/12 (brief), recurrence in 4/15 then in 5/15.  - TEE-guided DCCV for persistent atrial fibrillation in 5/19.  11. Spinal stenosis 12. OSA: Using CPAP.  13. Glaucoma 14. Has ILR 15. Colon cancer: Sigmoid adenocarcinoma 16. 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   SH: Married, lives in [place].  Former [place].  Quit smoking in 2014.  Rare ETOH now.   FH: No premature CAD  ROS: All systems reviewed and negative except as per HPI.   Current Outpatient Medications  Medication Sig Dispense Refill  . acetaminophen (TYLENOL) 325 MG tablet Take 650 mg every 6 (six) hours as  needed by mouth for moderate pain or headache.    . bimatoprost (LUMIGAN) 0.01 % SOLN Place 1 drop at bedtime into both eyes.     . budesonide-formoterol (SYMBICORT) 80-4.5 MCG/ACT inhaler Inhale 2 puffs into the lungs 2 (two) times daily.    [name] docusate sodium (COLACE) 100 MG capsule Take 100 mg by mouth 2 (two) times daily.    . dorzolamide-timolol (COSOPT) 22.3-6.8 MG/ML ophthalmic solution Place 2 drops into both eyes 2 (two) times daily.     . ferrous sulfate 325 (65 FE) MG tablet Take 325 mg by mouth daily.     [name] FLUoxetine (PROZAC) 40 MG capsule Take 1 capsule (40 mg total) by mouth daily. 30 capsule 0  . furosemide (LASIX) 20 MG tablet Take 40 mg daily for 3 days, then 40 mg daily alternating with 20 mg daily 60 tablet 11  . Magnesium 500 MG CAPS Take 500 mg by mouth at bedtime.    . Melatonin 5 MG TABS Take 5 mg by mouth at bedtime.    . methimazole (TAPAZOLE) 10 MG tablet Take 10 mg in am and 5 mg in pm 60 tablet 6  . metoprolol succinate (TOPROL-XL) 25 MG 24 hr tablet Take 1 tablet (25 mg total) by mouth daily. Take with or  immediately following a meal. 90 tablet 3  . montelukast (SINGULAIR) 10 MG tablet TAKE ONE TABLET AT BEDTIME. 30 tablet 2  . omeprazole (PRILOSEC) 20 MG capsule Take 1 capsule (20 mg total) by mouth daily. 90 capsule 3  . Polyethyl Glycol-Propyl Glycol (SYSTANE OP) Place 1-2 drops as needed into both eyes (for dry eyes).    . potassium chloride SA (K-DUR,KLOR-CON) 20 MEQ tablet TAKE 1 TABLET ONCE DAILY. 30 tablet 0  . predniSONE (DELTASONE) 5 MG tablet Take 1 tablet (5 mg total) by mouth daily with breakfast. 45 tablet 3  . rosuvastatin (CRESTOR) 40 MG tablet TAKE 1 TABLET BY MOUTH DAILY. 30 tablet 3  . amLODipine (NORVASC) 5 MG tablet Take 1 tablet (5 mg total) by mouth daily. 30 tablet 11  . Multiple Vitamins-Minerals (CENTRUM SILVER 50+MEN) TABS Take 1 tablet by mouth at bedtime.     No current facility-administered medications for this encounter.     BP (!) 158/70   Pulse 60   Wt 85.8 kg (189 lb 3.2 oz)   SpO2 95%   BMI 24.96 kg/m  General: NAD Neck: No JVD, no thyromegaly or thyroid nodule.  Lungs: Clear to auscultation bilaterally with normal respiratory effort. CV: Nondisplaced PMI.  Heart regular S1/S2, no S3/S4, no murmur.  No peripheral edema.  No carotid bruit.  Normal pedal pulses.  Abdomen: Soft, nontender, no hepatosplenomegaly, no distention.  Skin: Intact without lesions or rashes.  Neurologic: Alert and oriented x 3.  Psych: Normal affect. Extremities: No clubbing or cyanosis.  HEENT: Normal.   Assessment/Plan: 1. Chronic diastolic CHF: TEE in 3/79 showed LV EF 55-60% with normal RV size and systolic function.  Volume status looks ok today, NYHA class II though not very active.  - I encouraged him to increase exercise level.  - Continue alternating Lasix 40 mg daily with 20 mg daily.  BMET today.  2. Hyperlipidemia: Continue Crestor.  Check lipids next appt.   3. Carotid stenosis: s/p R CEA.  Dopplers followed at [facility].  4. PAD: s/p patch angioplasty to left fem-pop  bypass in 11/16.  Followed at [facility]. He is off cilostazol with CHF but his claudication has actually improved  over time.   5. AAA: Stable on last [name], followed at [facility].    6. COPD: No longer smoking. PFTs in 1/18 showed moderately severe obstructive defect. CT chest in 11/18 showed emphysema.  He is not on inhalers due to glaucoma.   - He follows with pulmonary. 7. Atrial fibrillation: He is symptomatic when in atrial fibrillation, in NSR today.  Atrial fibrillation seems to worsen his CHF. He is off anticoagulation due to bleeding sigmoid adenocarcinoma. He will not be able to restart until he has had the sigmoid mass removed.  Additionally, TSH was low when checked in 10/19 concerning for amiodarone-related hyperthyroidism, and there was also concern for amiodarone lung toxicity. He is now off amiodarone but is actually in NSR today.  - Continue Toprol XL 25 mg daily.  - He is off Eliquis for reasons outlined above, restart after cancer is resected.  - Continue to limit ETOH.  - Continue to use CPAP.  8. CKD: Stage 3. Stable BMET recently.   9. CAD: LHC in 11/18 with nonobstructive disease only.   10. 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  He is now on methimazole and follows with endocrinology.     11. Colon cancer: Sigmoid adenocarcinoma. Needs surgical resection.  He is at moderate surgical risk from a cardiac standpoint but not prohibitive.  Continue Toprol XL peri-op and start back on Eliquis after surgery.  Surgery has been delayed until hyperthyroidism has been controlled.  - Send thyroid indices today and forward to Dr. Cruzita Lederer.  12. HTN: BP has been running consistently high.   - Start amlodipine 5 mg daily.   Followup in 3 months.    [name] 10/15/2018

## 2018-10-15 NOTE — Telephone Encounter (Signed)
Both patient and wife received results of TSH, T3 and T4.  All questions asnwered

## 2018-10-16 ENCOUNTER — Inpatient Hospital Stay: Payer: Commercial Managed Care - PPO

## 2018-10-16 ENCOUNTER — Inpatient Hospital Stay: Payer: Commercial Managed Care - PPO | Admitting: Oncology

## 2018-10-16 ENCOUNTER — Telehealth: Payer: Self-pay | Admitting: Oncology

## 2018-10-16 NOTE — Telephone Encounter (Signed)
Called patient per 1/15 sch  Message - left message for patient to call back if r/s is still needed.

## 2018-10-17 ENCOUNTER — Telehealth: Payer: Self-pay | Admitting: *Deleted

## 2018-10-17 NOTE — Telephone Encounter (Signed)
Called wife to follow up on status of surgery. She reports his TSH has still been too low--recently checked again last week. Scheduled to recheck labs on 10/27/18 and if adequate will have surgery on 11/13/18 at [facility]. If not, it will be another month to check labs. She will call if he has surgery. Informed her that Dr. Benay Spice would like to see him around 10 days postop.

## 2018-10-20 ENCOUNTER — Other Ambulatory Visit (HOSPITAL_COMMUNITY): Payer: Self-pay | Admitting: Cardiology

## 2018-10-20 DIAGNOSIS — R0602 Shortness of breath: Secondary | ICD-10-CM

## 2018-10-20 DIAGNOSIS — I509 Heart failure, unspecified: Secondary | ICD-10-CM

## 2018-10-27 ENCOUNTER — Other Ambulatory Visit (INDEPENDENT_AMBULATORY_CARE_PROVIDER_SITE_OTHER): Payer: Commercial Managed Care - PPO

## 2018-10-27 DIAGNOSIS — E0590 Thyrotoxicosis, unspecified without thyrotoxic crisis or storm: Secondary | ICD-10-CM | POA: Diagnosis not present

## 2018-10-27 LAB — T4, FREE: Free T4: 1.34 ng/dL (ref 0.60–1.60)

## 2018-10-27 LAB — TSH: TSH: 0.02 u[IU]/mL — ABNORMAL LOW (ref 0.35–4.50)

## 2018-10-27 LAB — T3, FREE: T3, Free: 3 pg/mL (ref 2.3–4.2)

## 2018-10-28 ENCOUNTER — Ambulatory Visit: Payer: Self-pay | Admitting: Surgery

## 2018-10-28 ENCOUNTER — Telehealth: Payer: Self-pay | Admitting: Internal Medicine

## 2018-10-28 DIAGNOSIS — E0590 Thyrotoxicosis, unspecified without thyrotoxic crisis or storm: Secondary | ICD-10-CM

## 2018-10-28 NOTE — Telephone Encounter (Signed)
Notified patient wife of message from Dr. Cruzita Lederer, expressed understanding and agreement. No further questions.

## 2018-10-28 NOTE — Addendum Note (Signed)
Addended by: Cardell Peach I on: 10/28/2018 04:53 PM   Modules accepted: Orders

## 2018-10-28 NOTE — H&P (Signed)
CC: Referred by [facility] for newly diagnosed colon cancer - sigmoid, 35-39cm from anal verge  HPI: Mr. Liwanag is a very pleasant 42yoF with hx of HTN, Afib (off Eliquis x63mo), ?CHF, possible COPD, HLD referred by Dr. Rush Landmark following a colonoscopy done 06/25/18. He had presented to the hospital with what he thought was a TIA but was subsequently found to have anemia as his underlying issue. He underwent colonoscopy with Dr. Rush Landmark 06/25/18 which was notable for: 3 sessile polyps in the ascending colon 5-8 mm in size, removed 3 sessile polyps in the descending, hepatic flexure, ascending 3-6 mm in size, removed A frond-like/villous, fungating, polypoid and ulcerated partially obstructing medium sized mass in the sigmoid colon partially circumferential. 4 cm in length. Biopsies were taken. Area proximal to mass was injected with spot Multiple diverticula Internal hemorrhoids  Pathology returned as tubular adenomas in his colon without high-grade dysplasia Biopsy of the mass in the sigmoid revealed adenocarcinoma  He underwent staging CT chest/abdomen/pelvis 06/25/18. Multiple subcentimeter (2-3 mm) lung nodules noted. There is no evidence of metastatic disease. Findings of emphysema were also noted. Mass identified in mid sigmoid colon  CEA: 1.4  INTERVAL HX He was seen and evaluated by both cardiology and endocrinology. He was found to be thyrotoxic by the endocrinologist after being referred there by cardiology for his A. fib. Given these findings, he was not cleared by endocrinology for surgery. He has been on treatment for this and his last set of labs per Dr. Cruzita Lederer were improving. She stated it would be another 4-5 weeks before she thought he would be optimized for surgery as of earlier this month. Additionally, he has been on prednisone for his thyrotoxic state. He reports doing well today. He denies any abdominal pain. He denies any obstructive symptoms. He denies any  nausea/vomiting. He denies any further blood in the stool now that he is off anticoagulation.  He is here today with his wife and daughter. One of his other daugters recently underwent a robotic colon resection in [place] with Dr. Phill Mutter for colon cancer.   PMH: HTN (well controlled on oral antihypertensive), Afib (off Eliquis now, follows iwth Dr. Marylou Mccoy), COPD (follow with Dr. Melvyn Novas), HLD (well controlled on statin), ?CHF  PSH: Right carotid endarterectomy by Dr. Doren Custard 6 years ago Left lower extremity PAD with multiple bypasses at [facility] with Dr. Graylon Good VATS-right lower lobectomy for an early lung cancer at [facility] He denies any prior abdominal operations  FHx: Denies FHx of malignancy  Social: Denies use of tobacco/drugs. Reformed smoker. Social EtOH use. 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  ROS: A comprehensive 10 system review of systems was completed with the patient and pertinent findings as noted above.  The patient is a 78 year old male.   Allergies Sabino Gasser, CMA; 10/06/2018 11:02 AM) Sulfa Drugs  Penicillins  Allergies Reconciled   Medication History Sabino Gasser, CMA; 10/06/2018 11:02 AM) Rosuvastatin Calcium (40MG  Tablet, Oral) Active. Montelukast Sodium (10MG  Tablet, Oral) Active. Lumigan (0.01% Solution, Ophthalmic) Active. Symbicort (160-4.5MCG/ACT Aerosol, Inhalation) Active. FLUoxetine HCl (20MG  Capsule, Oral) Active. Furosemide (20MG  Tablet, Oral) Active. Dorzolamide HCl-Timolol Mal (22.3-6.8MG /ML Solution, Ophthalmic) Active. Toprol XL (25MG  Tablet ER 24HR, 1 tablet Oral daily) Active. Omeprazole (20MG  Capsule DR, Oral daily) Active. Potassium Chloride ER (20MEQ Tablet ER, Oral daily) Active. Ferrous Sulfate (325 (65 Fe)MG Tablet, Oral two times daily) Active. Magnesium (500MG  Tablet, Oral daily) Active. Colace (100MG  Capsule, Oral daily) Active. Dulcolax (5MG  Tablet DR,  2 tablets Oral daily) Active. Medications  Reconciled    Review of Systems Harrell Gave M. Montasia Chisenhall MD; 10/06/2018 11:39 AM) General Present- Appetite Loss and Fatigue. Not Present- Chills, Fever, Night Sweats, Weight Gain and Weight Loss. HEENT Present- Hearing Loss, Ringing in the Ears, Seasonal Allergies and Wears glasses/contact lenses. Not Present- Earache, Hoarseness, Nose Bleed, Oral Ulcers, Sinus Pain, Sore Throat, Visual Disturbances and Yellow Eyes. Respiratory Present- Wheezing. Not Present- Bloody sputum, Chronic Cough, Difficulty Breathing and Snoring. Cardiovascular Present- Leg Cramps, Shortness of Breath and Swelling of Extremities. Not Present- Chest Pain, Difficulty Breathing Lying Down, Palpitations and Rapid Heart Rate. Gastrointestinal Present- Bloody Stool, Constipation, Excessive gas, Hemorrhoids and Indigestion. Not Present- Abdominal Pain, Bloating, Change in Bowel Habits, Chronic diarrhea, Difficulty Swallowing, Gets full quickly at meals, Nausea, Rectal Pain and Vomiting. Male Genitourinary Present- Frequency and Urgency. Not Present- Blood in Urine, Change in Urinary Stream, Impotence, Nocturia, Painful Urination and Urine Leakage. Musculoskeletal Not Present- Back Pain, Joint Pain, Joint Stiffness, Muscle Pain, Muscle Weakness and Swelling of Extremities. Neurological Present- Decreased Memory and Tremor. Not Present- Fainting, Headaches, Numbness, Seizures, Tingling, Trouble walking and Weakness. Psychiatric Present- Depression. Not Present- Anxiety, Bipolar, Change in Sleep Pattern, Fearful and Frequent crying. Endocrine Present- Cold Intolerance. Not Present- Excessive Hunger, Hair Changes, Heat Intolerance and New Diabetes. Hematology Present- Easy Bruising and Excessive bleeding. Not Present- Blood Thinners, Gland problems, HIV and Persistent Infections.  Vitals Sabino Gasser CMA; 10/06/2018 11:03 AM) 10/06/2018 11:02 AM Weight: 185 lb Height: 73in Body Surface Area: 2.08 m Body Mass Index: 24.41 kg/m   Temp.: 97.85F(Oral)  Pulse: 63 (Regular)  BP: 134/84 (Sitting, Left Arm, Standard)       Physical Exam Harrell Gave M. Darrik Richman MD; 10/06/2018 11:39 AM) The physical exam findings are as follows: Note:Constitutional: No acute distress; conversant; no deformities Eyes: Moist conjunctiva; no lid lag; anicteric sclerae; pupils equal round and reactive to light Neck: Trachea midline; no palpable thyromegaly Lungs: Normal respiratory effort; no tactile fremitus CV: Regular rate and rhythm; no palpable thrill; no pitting edema GI: Abdomen soft, nontender, nondistended; no palpable hepatosplenomegaly MSK: Normal gait; no clubbing/cyanosis Psychiatric: Appropriate affect; alert and oriented 3 Lymphatic: No palpable cervical or axillary lymphadenopathy    Assessment & Plan Harrell Gave M. Laquashia Mergenthaler MD; 10/06/2018 11:44 AM) CANCER OF SIGMOID (C18.7) Story: Mr. Rathgeber is a very pleasant 50yoF with hx of HTN, Afib (off Eliquis x2mo), ?CHF, possible COPD, HLD with newly diagnosed adenocarcinoma of the mid sigmoid colon CT chest/abdomen/pelvis to ensure no evidence of metastatic disease; there is a large mediastinal node which the patient and his wife state they have been undergoing surveillance for given his history of lung cancer with [facility]. He reportedly had this biopsied 6 or 7 months ago and was told it was benign. They are planning on going surveillance of this. CEA: 1.4 Impression: -The anatomy and physiology of the GI tract was discussed at length with the patient. The pathophysiology of colon cancer was discussed at length with associated pictures. -Dr. Cruzita Lederer had requested we delay his surgery until his thyroid function normalizes given the substantial morbidity associated with surgery while he is thyrotoxic. He is currently on prednisone as well-5 mg daily (20mg  Equivalent of hydrocortison). We discussed higher potential anastomotic complications and even potentially stomas in the setting of  steroids. I have reached back out to Dr. Cruzita Lederer whom stopped his steroids and is following closely; she has cleared him for the procedure. -We discussed laparoscopic, possible open, sigmoid colectomy with colorectal anastomosis,  flexible sigmoidoscopy, possible takedown of splenic flexure. -The planned procedure, material risks (including, but not limited to, pain, bleeding, infection, scarring, need for blood transfusion, damage to surrounding structures- blood vessels/nerves/viscus/organs, damage to ureter, urine leak, leak from anastomosis, need for additional procedures, need for stoma which may be permanent, hernia, recurrence, pneumonia, heart attack, stroke, death) benefits and alternatives to surgery were discussed at length. The patient's questions were answered to his satisfaction, he voiced understanding and elected to proceed with surgery once he is cleared. Additionally, we discussed typical postoperative expectations and the recovery process. -30 minutes were spent face to face evaluating, counseling and coordinating his care.  Signed electronically by Ileana Roup, MD (10/06/2018 11:45 AM)

## 2018-10-28 NOTE — Telephone Encounter (Signed)
Patients wife is calling in regards to the patients labs.

## 2018-10-28 NOTE — Telephone Encounter (Signed)
Patient has his labs done yesterday.  Please advise.

## 2018-10-28 NOTE — Telephone Encounter (Signed)
TFTs are better! Free hormones are normal. We can clear him from sx now. He does not need to restart Prednisone, but need labs in 1 mo, Can you please order a TSH, fT4 and fT3?

## 2018-10-29 ENCOUNTER — Ambulatory Visit (INDEPENDENT_AMBULATORY_CARE_PROVIDER_SITE_OTHER): Payer: Commercial Managed Care - PPO

## 2018-10-29 DIAGNOSIS — I480 Paroxysmal atrial fibrillation: Secondary | ICD-10-CM | POA: Diagnosis not present

## 2018-10-30 NOTE — Progress Notes (Signed)
CARDIAC CLEARANCE/LOV DR [facility] 10-14-2018 EKG 10-14-2018 Epic LOV DR WERT 07-29-18 Epic ECHO TEE 02-14-18 Epic LEFT HEART CATH 08-20-17 Epic DR ALLRED LOV 03-24-18 Epic

## 2018-10-30 NOTE — Patient Instructions (Signed)
Troy Kenner [name]     Your procedure is scheduled on: 11-13-2018  Report to [facility]  Entrance  Report to admitting at 530 AM  BEING CPAP MASK AND TUBING ONLY  FOLLOW ALL BOWEL PRE INSTRUCTIONS FROM DR WHITE  Call this number if you have problems the morning of surgery [phone]   Remember:   [place] AND RINSE YOUR MOUTH OUT, NO CHEWING GUM CANDY OR MINTS.   DRINK 2 PRESURGERY ENSURE DRINKS THE NIGHT BEFORE SURGERY AT  1000 PM AND 1 PRESURGERY DRINK THE DAY OF THE PROCEDURE 3 HOURS PRIOR TO SCHEDULED SURGERY. NO SOLIDS AFTER MIDNIGHT THE DAY PRIOR TO THE SURGERY. NOTHING BY MOUTH EXCEPT CLEAR LIQUIDS UNTIL THREE HOURS PRIOR TO SCHEDULED SURGERY. PLEASE FINISH PRESURGERY ENSURE DRINK PER SURGEON ORDER 3 HOURS PRIOR TO SCHEDULED SURGERY TIME WHICH NEEDS TO BE COMPLETED AT 430 AM.    CLEAR LIQUID DIET   Foods Allowed                                                                     Foods Excluded  Coffee and tea, regular and decaf                             liquids that you cannot  Plain Jell-O in any flavor                                             see through such as: Fruit ices (not with fruit pulp)                                     milk, soups, orange juice  Iced Popsicles                                    All solid food Carbonated beverages, regular and diet                                    Cranberry, grape and apple juices Sports drinks like Gatorade Lightly seasoned clear broth or consume(fat free) Sugar, honey syrup  Sample Menu Breakfast                                Lunch                                     Supper Cranberry juice                    Beef broth  Chicken broth Jell-O                                     Grape juice                           Apple juice Coffee or tea                        Jell-O                                      Popsicle                          Coffee or tea                        Coffee or tea  _____________________________________________________________________     Take these medicines the morning of surgery with A SIP OF WATER: SYMBICORT, FLUOXETINE (PROZAC), ROSUVASTATIN (CRESTOR), METOPROLOL SUCCINATE, AMLODIPINE (NORVASC), PREDNISONE, COSOPT EYE DROP, METHIMAZOLE (TAPAZOLE), OMEPRAZOLE (PRILOSEC)                               You may not have any metal on your body including hair pins and              piercings  Do not wear jewelry, make-up, lotions, powders or perfumes, deodorant             Do not wear nail polish.  Do not shave  48 hours prior to surgery.              Men may shave face and neck.   Do not bring valuables to the hospital. [place].  Contacts, dentures or bridgework may not be worn into surgery.  Leave suitcase in the car. After surgery it may be brought to your room.                   Please read over the following fact sheets you were given: _____________________________________________________________________   WHAT IS A BLOOD TRANSFUSION? Blood Transfusion Information  A transfusion is the replacement of blood or some of its parts. Blood is made up of multiple cells which provide different functions.  Red blood cells carry oxygen and are used for blood loss replacement.  White blood cells fight against infection.  Platelets control bleeding.  Plasma helps clot blood.  Other blood products are available for specialized needs, such as hemophilia or other clotting disorders. BEFORE THE TRANSFUSION  Who gives blood for transfusions?   Healthy volunteers who are fully evaluated to make sure their blood is safe. This is blood bank blood. Transfusion therapy is the safest it has ever been in the practice of medicine. Before blood is taken from a donor, a complete history is taken to make sure that person has no history  of diseases nor engages in risky social behavior (examples are intravenous drug use or sexual activity with multiple partners). The donor's travel history is screened to minimize risk of transmitting infections, such as malaria. The  donated blood is tested for signs of infectious diseases, such as HIV and hepatitis. The blood is then tested to be sure it is compatible with you in order to minimize the chance of a transfusion reaction. If you or a relative donates blood, this is often done in anticipation of surgery and is not appropriate for emergency situations. It takes many days to process the donated blood. RISKS AND COMPLICATIONS Although transfusion therapy is very safe and saves many lives, the main dangers of transfusion include:   Getting an infectious disease.  Developing a transfusion reaction. This is an allergic reaction to something in the blood you were given. Every precaution is taken to prevent this. The decision to have a blood transfusion has been considered carefully by your caregiver before blood is given. Blood is not given unless the benefits outweigh the risks. AFTER THE TRANSFUSION  Right after receiving a blood transfusion, you will usually feel much better and more energetic. This is especially true if your red blood cells have gotten low (anemic). The transfusion raises the level of the red blood cells which carry oxygen, and this usually causes an energy increase.  The nurse administering the transfusion will monitor you carefully for complications. HOME CARE INSTRUCTIONS  No special instructions are needed after a transfusion. You may find your energy is better. Speak with your caregiver about any limitations on activity for underlying diseases you may have. SEEK MEDICAL CARE IF:   Your condition is not improving after your transfusion.  You develop redness or irritation at the intravenous (IV) site. SEEK IMMEDIATE MEDICAL CARE IF:  Any of the following symptoms  occur over the next 12 hours:  Shaking chills.  You have a temperature by mouth above 102 F (38.9 C), not controlled by medicine.  Chest, back, or muscle pain.  People around you feel you are not acting correctly or are confused.  Shortness of breath or difficulty breathing.  Dizziness and fainting.  You get a rash or develop hives.  You have a decrease in urine output.  Your urine turns a dark color or changes to pink, red, or brown. Any of the following symptoms occur over the next 10 days:  You have a temperature by mouth above 102 F (38.9 C), not controlled by medicine.  Shortness of breath.  Weakness after normal activity.  The white part of the eye turns yellow (jaundice).  You have a decrease in the amount of urine or are urinating less often.  Your urine turns a dark color or changes to pink, red, or brown. Document Released: 09/14/2000 Document Revised: 12/10/2011 Document Reviewed: 05/03/2008 [facility] Patient Information 2014 [place], [place].  _______________________________________________________________________

## 2018-10-30 NOTE — Progress Notes (Signed)
Carelink Summary Report / Loop Recorder 

## 2018-10-31 LAB — CUP PACEART REMOTE DEVICE CHECK
Implantable Pulse Generator Implant Date: 20161111
MDC IDC SESS DTM: 20200129094203

## 2018-11-04 ENCOUNTER — Other Ambulatory Visit (HOSPITAL_COMMUNITY): Payer: Self-pay

## 2018-11-04 DIAGNOSIS — R0602 Shortness of breath: Secondary | ICD-10-CM

## 2018-11-04 DIAGNOSIS — I509 Heart failure, unspecified: Secondary | ICD-10-CM

## 2018-11-04 MED ORDER — METOPROLOL SUCCINATE ER 25 MG PO TB24: 25.0000 mg | ORAL_TABLET | Freq: Every day | ORAL | 3 refills | Status: DC

## 2018-11-04 MED ORDER — ROSUVASTATIN CALCIUM 40 MG PO TABS: 40.0000 mg | ORAL_TABLET | Freq: Every day | ORAL | 11 refills | Status: DC

## 2018-11-04 MED ORDER — POTASSIUM CHLORIDE CRYS ER 20 MEQ PO TBCR: 20.0000 meq | EXTENDED_RELEASE_TABLET | Freq: Every day | ORAL | 11 refills | Status: DC

## 2018-11-05 ENCOUNTER — Ambulatory Visit: Payer: Commercial Managed Care - PPO | Admitting: Internal Medicine

## 2018-11-06 ENCOUNTER — Encounter: Payer: Self-pay | Admitting: Internal Medicine

## 2018-11-07 ENCOUNTER — Encounter (HOSPITAL_COMMUNITY): Payer: Self-pay

## 2018-11-07 ENCOUNTER — Inpatient Hospital Stay (HOSPITAL_COMMUNITY)
Admission: RE | Admit: 2018-11-07 | Discharge: 2018-11-07 | Disposition: A | Discharge status: Home / Self Care | Payer: Commercial Managed Care - PPO | Source: Ambulatory Visit

## 2018-11-17 ENCOUNTER — Ambulatory Visit (INDEPENDENT_AMBULATORY_CARE_PROVIDER_SITE_OTHER): Payer: Commercial Managed Care - PPO | Admitting: Internal Medicine

## 2018-11-17 ENCOUNTER — Encounter: Payer: Self-pay | Admitting: Internal Medicine

## 2018-11-17 DIAGNOSIS — J449 Chronic obstructive pulmonary disease, unspecified: Secondary | ICD-10-CM

## 2018-11-17 MED ORDER — ALBUTEROL SULFATE HFA 108 (90 BASE) MCG/ACT IN AERS: 2.0000 | INHALATION_SPRAY | Freq: Four times a day (QID) | RESPIRATORY_TRACT | 0 refills | Status: DC | PRN

## 2018-11-17 NOTE — Assessment & Plan Note (Signed)
Rx for albuterol to use for SOB. Given sample of breo to use for a few days to see if this helps. My recommendation was for steroids for a few days but they do not want their surgery for colon cancer to be delayed. Advised if worsening needs return visit and may need the steroids.

## 2018-11-17 NOTE — Progress Notes (Signed)
   Subjective:   Patient ID: Troy Kenner [name], male    DOB: 12-18-1940, 78 y.o.   MRN: [id number]  HPI The patient is a 78 y.o. man coming in for cold symptoms and SOB. He does have COPD and colon cancer. Scheduled for surgery in about 1 month and the surgeon will not do surgery if he is on prednisone within a month so they would like to avoid. Started about 3 days ago. Main symptoms are: SOB, cough, nasal drainage. Denies fevers or chills or body aches or headaches. Overall it is slightly better today. Has tried claritin D starting Saturday and seems to be helping starting today. Denies known sick contacts. Still SOB today. Still taking symbicort. Does not have a rescue inhaler.   Review of Systems  Constitutional: Positive for activity change, appetite change and fatigue. Negative for chills, fever and unexpected weight change.  HENT: Positive for congestion, postnasal drip and rhinorrhea. Negative for ear discharge, ear pain, sinus pressure, sinus pain, sneezing, sore throat, tinnitus, trouble swallowing and voice change.   Eyes: Negative.   Respiratory: Positive for cough, shortness of breath and wheezing. Negative for chest tightness.   Cardiovascular: Negative.   Gastrointestinal: Negative.   Musculoskeletal: Negative for myalgias.  Neurological: Negative.     Objective:  Physical Exam Constitutional:      Appearance: He is well-developed.  HENT:     Head: Normocephalic and atraumatic.     Comments: Oropharynx with redness and clear drainage, nose with swollen turbinates, TMs normal bilaterally.  Neck:     Musculoskeletal: Normal range of motion.     Thyroid: No thyromegaly.  Cardiovascular:     Rate and Rhythm: Normal rate and regular rhythm.  Pulmonary:     Effort: Pulmonary effort is normal. No respiratory distress.     Breath sounds: Wheezing present. No rales.     Comments: Minimal expiratory wheezing, some upper airway noises as well Abdominal:     Palpations: Abdomen is  soft.  Musculoskeletal:        General: Tenderness present.  Lymphadenopathy:     Cervical: No cervical adenopathy.  Skin:    General: Skin is warm and dry.  Neurological:     Mental Status: He is alert and oriented to person, place, and time.     Vitals:   11/17/18 1049  BP: (!) 150/78  Pulse: (!) 59  Temp: 97.6 F (36.4 C)  TempSrc: Oral  SpO2: 94%  Weight: 188 lb (85.3 kg)  Height: 6\' 1"  (1.854 m)    Assessment & Plan:  Visit time 25 minutes: greater than 50% of that time was spent in face to face counseling and coordination of care with the patient: counseled about COPD and treatment options as well as in reference to the colon cancer and risk there

## 2018-11-17 NOTE — Patient Instructions (Signed)
We have sent in a prescription for the albuterol inhaler or the rescue inhaler we talked about which you can use 1-2 puffs every 4 hours as needed for breathing problems.   I have given you an inhaler to use 1 puff daily along with symbicort for the next 1 week or so to help your lungs get better.   This is likely coming from a viral cold and keep taking the claritin D as well.   If you are not improving come back in.

## 2018-11-18 ENCOUNTER — Ambulatory Visit (HOSPITAL_COMMUNITY)
Admission: RE | Admit: 2018-11-18 | Discharge: 2018-11-18 | Disposition: A | Discharge status: Home / Self Care | Payer: Commercial Managed Care - PPO | Source: Ambulatory Visit | Attending: Cardiology | Admitting: Cardiology

## 2018-11-18 ENCOUNTER — Other Ambulatory Visit (HOSPITAL_COMMUNITY): Payer: Self-pay

## 2018-11-18 ENCOUNTER — Other Ambulatory Visit: Payer: Self-pay

## 2018-11-18 VITALS — BP 116/68 | HR 63 | Wt 193.4 lb

## 2018-11-18 DIAGNOSIS — Z7901 Long term (current) use of anticoagulants: Secondary | ICD-10-CM | POA: Diagnosis not present

## 2018-11-18 DIAGNOSIS — Z79899 Other long term (current) drug therapy: Secondary | ICD-10-CM | POA: Diagnosis not present

## 2018-11-18 DIAGNOSIS — E0590 Thyrotoxicosis, unspecified without thyrotoxic crisis or storm: Secondary | ICD-10-CM | POA: Diagnosis not present

## 2018-11-18 DIAGNOSIS — I5032 Chronic diastolic (congestive) heart failure: Secondary | ICD-10-CM | POA: Insufficient documentation

## 2018-11-18 DIAGNOSIS — E785 Hyperlipidemia, unspecified: Secondary | ICD-10-CM | POA: Insufficient documentation

## 2018-11-18 DIAGNOSIS — I130 Hypertensive heart and chronic kidney disease with heart failure and stage 1 through stage 4 chronic kidney disease, or unspecified chronic kidney disease: Secondary | ICD-10-CM | POA: Diagnosis not present

## 2018-11-18 DIAGNOSIS — G4733 Obstructive sleep apnea (adult) (pediatric): Secondary | ICD-10-CM | POA: Insufficient documentation

## 2018-11-18 DIAGNOSIS — Z87891 Personal history of nicotine dependence: Secondary | ICD-10-CM | POA: Diagnosis not present

## 2018-11-18 DIAGNOSIS — I2510 Atherosclerotic heart disease of native coronary artery without angina pectoris: Secondary | ICD-10-CM | POA: Insufficient documentation

## 2018-11-18 DIAGNOSIS — J449 Chronic obstructive pulmonary disease, unspecified: Secondary | ICD-10-CM | POA: Diagnosis not present

## 2018-11-18 DIAGNOSIS — H409 Unspecified glaucoma: Secondary | ICD-10-CM | POA: Insufficient documentation

## 2018-11-18 DIAGNOSIS — C187 Malignant neoplasm of sigmoid colon: Secondary | ICD-10-CM | POA: Diagnosis not present

## 2018-11-18 DIAGNOSIS — J90 Pleural effusion, not elsewhere classified: Secondary | ICD-10-CM

## 2018-11-18 DIAGNOSIS — I480 Paroxysmal atrial fibrillation: Secondary | ICD-10-CM

## 2018-11-18 DIAGNOSIS — N183 Chronic kidney disease, stage 3 (moderate): Secondary | ICD-10-CM | POA: Diagnosis not present

## 2018-11-18 DIAGNOSIS — Z8673 Personal history of transient ischemic attack (TIA), and cerebral infarction without residual deficits: Secondary | ICD-10-CM | POA: Insufficient documentation

## 2018-11-18 DIAGNOSIS — I4819 Other persistent atrial fibrillation: Secondary | ICD-10-CM | POA: Diagnosis not present

## 2018-11-18 DIAGNOSIS — I714 Abdominal aortic aneurysm, without rupture: Secondary | ICD-10-CM | POA: Diagnosis not present

## 2018-11-18 HISTORY — DX: Pleural effusion, not elsewhere classified: J90

## 2018-11-18 LAB — BRAIN NATRIURETIC PEPTIDE: B NATRIURETIC PEPTIDE 5: 387.8 pg/mL — AB (ref 0.0–100.0)

## 2018-11-18 LAB — BASIC METABOLIC PANEL
ANION GAP: 10 (ref 5–15)
BUN: 18 mg/dL (ref 8–23)
CO2: 24 mmol/L (ref 22–32)
Calcium: 9.4 mg/dL (ref 8.9–10.3)
Chloride: 103 mmol/L (ref 98–111)
Creatinine, Ser: 1.04 mg/dL (ref 0.61–1.24)
GFR calc Af Amer: >60 mL/min (ref >60)
GFR calc non Af Amer: >60 mL/min (ref >60)
Glucose, Bld: 116 mg/dL — ABNORMAL HIGH (ref 70–99)
Potassium: 4.1 mmol/L (ref 3.5–5.1)
Sodium: 137 mmol/L (ref 135–145)

## 2018-11-18 LAB — CBC
HCT: 39.6 % (ref 39.0–52.0)
Hemoglobin: 12.6 g/dL — ABNORMAL LOW (ref 13.0–17.0)
MCH: 27.8 pg (ref 26.0–34.0)
MCHC: 31.8 g/dL (ref 30.0–36.0)
MCV: 87.4 fL (ref 80.0–100.0)
Platelets: 324 10*3/uL (ref 150–400)
RBC: 4.53 MIL/uL (ref 4.22–5.81)
RDW: 17 % — ABNORMAL HIGH (ref 11.5–15.5)
WBC: 6.9 10*3/uL (ref 4.0–10.5)
nRBC: 0 % (ref 0.0–0.2)

## 2018-11-18 MED ORDER — FUROSEMIDE 20 MG PO TABS: ORAL_TABLET | ORAL | 3 refills | Status: DC

## 2018-11-18 MED ORDER — DOXYCYCLINE HYCLATE 100 MG PO CAPS: 100.0000 mg | ORAL_CAPSULE | Freq: Two times a day (BID) | ORAL | 0 refills | Status: DC

## 2018-11-18 NOTE — Patient Instructions (Addendum)
Labs drawn today. We will call you if results come back abnormal. Otherwise, no news is good news.  START doxycycline 100 mg twice daily.  INCREASED lasix to 3 tabs (60 mg) daily for 3 days, then 2 tabs (40 mg) daily.  Follow up in 1 week in APP clinic with labs.  Chest Xray ordered today. Will call you with results.

## 2018-11-18 NOTE — Progress Notes (Signed)
Patient ID: Troy Asper [name], male   DOB: August 29, 1941, 78 y.o.   MRN: [id number] PCP: Dr. Quay [name] Cardiology: Dr. Aundra [name]  78 y.o. with history of HTN, COPD, active smoking/COPD, carotid stenosis s/p right CEA, paroxysmal atrial fibrillation, and PAD returns for followup of CHF and atrial fibrillation.  He does not have known obstructive CAD but is at high risk for CAD based on his comorbidities.  Lexiscan Cardiolite in 3/14 showed no ischemia or infarction and echo in 3/14 showed normal EF.  He had a left fem-pop bypass at the [facility] in the '90s.  He is followed at [facility] for PAD. He has chronic right calf/thigh/buttocks claudication that is unchanged over the last few years and follows regularly at [facility].  He has had right lower lobectomy for lung cancer.  He continues to stay off cigarettes.    At a prior appointment, he was in atrial fibrillation but did not realize it.  I started him on Eliquis and diltiazem CD, and he spontaneously converted to NSR.  In 5/15, he was at [facility] and felt "strange" one day: fatigued, weak, short of breath.  He went to the ER and was in atrial fibrillation with HR in 80s-90s.  He spontaneously converted to NSR in the ER.  He felt back to normal after converting to NSR.  I started him on Multaq 400 mg bid.  With CHF, he was eventually transitioned over to amiodarone.   He had patch angioplasty revision of left fem-pop bypass in 11/16.  Now with minimal claudication.  He follows with [facility].     He had a Cardiolite and echo in 7/17 that were unremarkable.  Repeat Cardiolite in 12/17 showed no ischemia, echo was uninterpretable.  Cardiac MRI was therefore done in 1/18, showing EF 66% with normal-appearing RV, no late gadolinium enhancement.   Given increased exertional dyspnea and a defect on Cardiolite, he had LHC in 11/18.  This showed no obstructive CAD. He had a chest CT done given concern for possible amiodarone lung toxicity with increased dyspnea. This showed emphysema,  no ILD.    In 5/19, he developed a probable viral syndrome with dehydration and had a presyncopal episode.  He was orthostatic in the hospital.  He was noted to be in atrial fibrillation this admission which remained persistent.  He had a TEE-guided DCCV back to NSR.   In 9/19, he was admitted with GI bleeding and found to have a sigmoid mass on colonoscopy, path showed sigmoid adenocarcinoma.  He was noted to have right paratracheal lymphadenopathy but EUS with biopsy in 10/19 did not show malignancy.  Eliquis was stopped with bleeding sigmoid mass.  He was noted to be back in atrial fibrillation this admission.   Amiodarone was stopped with elevated ESR and increased dyspnea.  He was also found to have new hyperthyroidism. He has been started on methimazole and following with endocrinology.  Colon cancer surgery has been on hold while hyperthyroidism is controlled.   Troy [name] is worked in today for a sick visit.  For 3-4 days, he has been short of breath and wheezing.  No fever.  Weight has been up at home though stable on our scales.  He saw his PCP, thought to have COPD exacerbation.  Did not want to take prednisone as he has surgery planned for colon cancer in early March.  He felt like he was in atrial fibrillation with irregular pulse today.  He is mildly tachypneic.  Dyspneic walking around the  house.  No chest pain.  Oxygen saturation 95% today on room air.    ECG (personally reviewed): NSR (low voltage Ps) with PACs.    Labs (3/13): LDL 75, HDL 35, K 4.1, creatinine 1.1 Labs (3/14): LDL 91, HDL 27 Labs (10/14): K 4.3, creatinine 0.98 Labs (4/15): K 4.9, creatinine 1.1, LDL 76, HDL 30 Labs (5/15): K 4.3, creatinine 1.7, BNP 251 Labs (6/15): K 4.4, creatinine 1.3 Labs (10/15): TSH normal Labs (5/16): K 4.2, creatinine 1.12, HCT 39.5, LFTs normal, LDL 84, HDL 43, TSH normal Labs (11/16): creatinine 0.94 Labs (4/17): LDL 39, HDL 39 Labs (6/17): K 4.5, creatinine 1.2 Labs (9/17): K 4,  creatinine 1.25, HCT 38.7 Labs (12/17): K 4.5, creatinine 1.49 => 1.62, BNP 43 Labs (2/18): K 3.8, creatinine 1.33, LFTs normal, TSH normal Labs (6/18): K 4.3, creatinine 1.49, LFTs normal, TSH normal, hgb 12.3 Labs (11/18): ESR 60, TSH normal, LFTs normal, creatinine 1.34 Labs (1/19): LDL 50 Labs (5/19): K 4.6, creatinine 1.55, LFTs normal, hgb 12.7, LFTs normal Labs (10/19): K 4 => 4.1, creatinine 1.39 => 1.43, BNP 279, hgb 10.7, TSH low, free T4 and free T3 low, ESR 108  PMH: 1. HTN: Fatigue and cough with ramipril use.  2. COPD: Quit smoking 2014. PFTs (1/18) with moderately severe COPD.  - CT chest (11/18): Emphysema noted, no ILD.  3. AAA: CT 1/13 with 3.0 cm AAA.  Abdominal US (1/14) with 3.4 cm AAA. Abdominal US (4/15) with 3.25 x 3.27 AAA. Abdominal US (3/16) with 3.7 cm AAA.  Followed at [facility].  - Abd [name] (1/19): 3.3 cm AAA. 4. Squamous cell lung cancer diagnosed 2/12.  Had right lower lobectomy in 4/12.  5. Hyperlipidemia 6. PAD: Left fem-pop bypass 1994.  ABIs (2/12) 0.62 on right, 0.95 on left. ABIs (1/14): 0.65 on right, 1.04 on left. ABIs (4/15) 0.66 right, 0.87 left.  ABIs (3/16) 0.6 right, 0.88 left.  Patch angioplasty left fem-pop bypass in 11/16.  - Left fem-pop bypass patent on doppler evaluation in 12/17.  7. Stress myoview 2004 was normal. Lexiscan Cardiolite (3/14) with EF 66%, no ischemia or infarction. Lexiscan Cardiolite (7/17) with EF 61%, no ischemia/infarction.  - Cardiolite (12/17) with EF 62%, inferior/inferolateral fixed defect, most likely diaphragmatic attenuation, no ischemia.  - Cardiolite (9/18) with EF > 65%, fixed inferior defect (attenuation versus infarction), no ischemia.  - LHC (11/18): No obstructive CAD.  8. Chronic diastolic CHF: Echo (7/65): technically difficult with EF 55-60%, upper normal RV size.  Echo (5/16) with EF 60-65%.  Echo (7/17) with EF 55-60%, normal RV size and systolic function.  - Echo 12/17 with very poor windows, unable to comment  on LV or RV function.  - Cardiac MRI (1/18) with EF 66%, normal RV size and systolic function.  - TEE (5/19): EF 55-60%, normal RV size and systolic function.  9. Carotid stenosis: TIA 10/14.  Carotid dopplers with > 46% RICA, 50-35% LICA.  Patient had right CEA in 10/14. Carotids (4/15) patent right CEA, LICA 46-56% stenosis.  Carotids (81/27) < 51% LICA, patent right CEA.  - Carotid dopplers (12/17): right CEA ok, < 70% LICA stenosis.  - Carotid dopplers (1/19): Right CEA ok, 0-17% LICA stenosis.  10. Atrial fibrillation: Paroxysmal. First noted after lobectomy in 4/12 (brief), recurrence in 4/15 then in 5/15.  - TEE-guided DCCV for persistent atrial fibrillation in 5/19.  11. Spinal stenosis 12. OSA: Using CPAP.  13. Glaucoma 14. Has ILR 15. Colon cancer: Sigmoid adenocarcinoma 16. Hyperthyroidism:  Likely related to amiodarone use.   SH: Married, lives in [place].  Former [name].  Quit smoking in 2014.  Rare ETOH now.   FH: No premature CAD  ROS: All systems reviewed and negative except as per HPI.   Current Outpatient Medications  Medication Sig Dispense Refill  . acetaminophen (TYLENOL) 325 MG tablet Take 650 mg every 6 (six) hours as needed by mouth for moderate pain or headache.    . albuterol (PROVENTIL HFA;VENTOLIN HFA) 108 (90 Base) MCG/ACT inhaler Inhale 2 puffs into the lungs every 6 (six) hours as needed for wheezing or shortness of breath. 1 Inhaler 0  . amLODipine (NORVASC) 5 MG tablet Take 1 tablet (5 mg total) by mouth daily. 30 tablet 11  . bimatoprost (LUMIGAN) 0.01 % SOLN Place 1 drop at bedtime into both eyes.     . budesonide-formoterol (SYMBICORT) 160-4.5 MCG/ACT inhaler Inhale 2 puffs into the lungs 2 (two) times daily.     [name] docusate sodium (COLACE) 100 MG capsule Take 100 mg by mouth daily.     . dorzolamide-timolol (COSOPT) 22.3-6.8 MG/ML ophthalmic solution Place 2 drops into both eyes 2 (two) times daily.     . ferrous sulfate 325 (65  FE) MG tablet Take 325 mg by mouth 2 (two) times daily.     [name] FLUoxetine (PROZAC) 40 MG capsule Take 1 capsule (40 mg total) by mouth daily. 30 capsule 0  . Magnesium 500 MG CAPS Take 500 mg by mouth daily.     . Melatonin 5 MG TABS Take 5 mg by mouth at bedtime.    . methimazole (TAPAZOLE) 10 MG tablet Take 10 mg in am and 5 mg in pm (Patient taking differently: Take 5-10 mg by mouth See admin instructions. Take 5 mg by mouth in the morning and take 10 mg by mouth in the evening) 60 tablet 6  . metoprolol succinate (TOPROL-XL) 25 MG 24 hr tablet Take 1 tablet (25 mg total) by mouth daily. Take with or immediately following a meal. 90 tablet 3  . montelukast (SINGULAIR) 10 MG tablet TAKE ONE TABLET AT BEDTIME. (Patient taking differently: Take 10 mg by mouth at bedtime. ) 30 tablet 2  . omeprazole (PRILOSEC) 20 MG capsule Take 1 capsule (20 mg total) by mouth daily. 90 capsule 3  . Polyethyl Glycol-Propyl Glycol (SYSTANE) 0.4-0.3 % SOLN Place 1 drop into both eyes daily as needed (for dry eyes).     . potassium chloride SA (K-DUR,KLOR-CON) 20 MEQ tablet Take 1 tablet (20 mEq total) by mouth daily. 30 tablet 11  . rosuvastatin (CRESTOR) 40 MG tablet Take 1 tablet (40 mg total) by mouth daily. 30 tablet 11  . senna (SENOKOT) 8.6 MG tablet Take 1 tablet by mouth 2 (two) times daily.    [name] doxycycline (VIBRAMYCIN) 100 MG capsule Take 1 capsule (100 mg total) by mouth 2 (two) times daily. 28 capsule 0  . furosemide (LASIX) 20 MG tablet Take 3 tabs (60 mg) for 3 days, then 2 tabs (40 mg) daily. 75 tablet 3   No current facility-administered medications for this encounter.     BP 116/68   Pulse 63   Wt 87.7 kg (193 lb 6.4 oz)   SpO2 95%   BMI 25.52 kg/m  General: NAD Neck: JVP 9-10 cm, no thyromegaly or thyroid nodule.  Lungs: Bilateral wheezes/rhonchi.  CV: Nondisplaced PMI.  Heart regular S1/S2, no S3/S4, no murmur.  No peripheral edema.  No carotid bruit.  Normal pedal pulses.  Abdomen: Soft,  nontender, no hepatosplenomegaly, no distention.  Skin: Intact without lesions or rashes.  Neurologic: Alert and oriented x 3.  Psych: Normal affect. Extremities: No clubbing or cyanosis.  HEENT: Normal.   Assessment/Plan: 1. Chronic diastolic CHF: TEE in 2/58 showed LV EF 55-60% with normal RV size and systolic function.  He is more short of breath, may be COPD exacerbation + some volume overload based on exam.   - Increase Lasix to 60 mg daily x 3 days then 40 mg daily after that. BMET/BNP today. BMET at followup in 1 week.  2. Hyperlipidemia: Continue Crestor.  Need to check lipids next appt.   3. Carotid stenosis: s/p R CEA.  Dopplers followed at [facility].  4. PAD: s/p patch angioplasty to left fem-pop bypass in 11/16.  Followed at [facility]. He is off cilostazol with CHF but his claudication has actually improved over time.   5. AAA: Stable on last [name], followed at [facility].    6. COPD: No longer smoking. PFTs in 1/18 showed moderately severe obstructive defect. CT chest in 11/18 showed emphysema.  Today, he has diffuse wheezing and dyspnea.  I suspect that there is a component of COPD exacerbation.   - He was started on fluticasone inhaler by his PCP.  Trying to avoid systemic steroids with need for bowel surgery soon.  - CXR (PA/lateral) today to assess for PNA.  - I will treat with doxycycline 100 mg bid x 1 week.  7. Atrial fibrillation: He is symptomatic when in atrial fibrillation, in NSR today (low voltage Ps).  Atrial fibrillation seems to worsen his CHF. He is off anticoagulation due to bleeding sigmoid adenocarcinoma. He will not be able to restart until he has had the sigmoid mass removed.  Additionally, TSH was low when checked in 10/19 concerning for amiodarone-related hyperthyroidism, and there was also concern for amiodarone lung toxicity. He is now off amiodarone but is actually in NSR today.  - Continue Toprol XL 25 mg daily.  - He is off Eliquis with colon tumor and bleeding, need to  restart after surgery.  - Continue to limit ETOH.  - Continue to use CPAP.  8. CKD: Stage 3. BMET today.   9. CAD: LHC in 11/18 with nonobstructive disease only.   10. 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  He is now on methimazole and follows with endocrinology.     11. Colon cancer: Sigmoid adenocarcinoma. Needs surgical resection.  He is at moderate surgical risk from a cardiac standpoint but not prohibitive.  Continue Toprol XL peri-op and start back on Eliquis after surgery.  Surgery was been delayed until hyperthyroidism controlled, now planned for early March.  12. HTN: Now controlled on amlodipine.    Followup in 1 week with NP/PA.    [name] 11/18/2018

## 2018-11-19 ENCOUNTER — Telehealth (HOSPITAL_COMMUNITY): Payer: Self-pay

## 2018-11-19 NOTE — Telephone Encounter (Signed)
-----   Message from Larey Dresser, MD sent at 11/19/2018 12:14 PM EST ----- No acute findings. Please let him know.

## 2018-11-19 NOTE — Telephone Encounter (Signed)
Relayed information

## 2018-11-25 ENCOUNTER — Ambulatory Visit (HOSPITAL_COMMUNITY)
Admission: RE | Admit: 2018-11-25 | Discharge: 2018-11-25 | Disposition: A | Discharge status: Home / Self Care | Payer: Commercial Managed Care - PPO | Source: Ambulatory Visit | Attending: Internal Medicine | Admitting: Internal Medicine

## 2018-11-25 VITALS — BP 122/62 | HR 51 | Wt 185.0 lb

## 2018-11-25 DIAGNOSIS — E785 Hyperlipidemia, unspecified: Secondary | ICD-10-CM | POA: Insufficient documentation

## 2018-11-25 DIAGNOSIS — I130 Hypertensive heart and chronic kidney disease with heart failure and stage 1 through stage 4 chronic kidney disease, or unspecified chronic kidney disease: Secondary | ICD-10-CM | POA: Diagnosis not present

## 2018-11-25 DIAGNOSIS — J439 Emphysema, unspecified: Secondary | ICD-10-CM | POA: Diagnosis not present

## 2018-11-25 DIAGNOSIS — I2510 Atherosclerotic heart disease of native coronary artery without angina pectoris: Secondary | ICD-10-CM | POA: Diagnosis not present

## 2018-11-25 DIAGNOSIS — C187 Malignant neoplasm of sigmoid colon: Secondary | ICD-10-CM | POA: Diagnosis not present

## 2018-11-25 DIAGNOSIS — N183 Chronic kidney disease, stage 3 (moderate): Secondary | ICD-10-CM | POA: Insufficient documentation

## 2018-11-25 DIAGNOSIS — E0590 Thyrotoxicosis, unspecified without thyrotoxic crisis or storm: Secondary | ICD-10-CM

## 2018-11-25 DIAGNOSIS — G4733 Obstructive sleep apnea (adult) (pediatric): Secondary | ICD-10-CM | POA: Insufficient documentation

## 2018-11-25 DIAGNOSIS — H409 Unspecified glaucoma: Secondary | ICD-10-CM | POA: Diagnosis not present

## 2018-11-25 DIAGNOSIS — Z85118 Personal history of other malignant neoplasm of bronchus and lung: Secondary | ICD-10-CM | POA: Insufficient documentation

## 2018-11-25 DIAGNOSIS — Z8673 Personal history of transient ischemic attack (TIA), and cerebral infarction without residual deficits: Secondary | ICD-10-CM | POA: Diagnosis not present

## 2018-11-25 DIAGNOSIS — Z7901 Long term (current) use of anticoagulants: Secondary | ICD-10-CM | POA: Insufficient documentation

## 2018-11-25 DIAGNOSIS — Z87891 Personal history of nicotine dependence: Secondary | ICD-10-CM | POA: Insufficient documentation

## 2018-11-25 DIAGNOSIS — I5032 Chronic diastolic (congestive) heart failure: Secondary | ICD-10-CM | POA: Diagnosis present

## 2018-11-25 DIAGNOSIS — Z79899 Other long term (current) drug therapy: Secondary | ICD-10-CM | POA: Insufficient documentation

## 2018-11-25 DIAGNOSIS — J449 Chronic obstructive pulmonary disease, unspecified: Secondary | ICD-10-CM | POA: Insufficient documentation

## 2018-11-25 DIAGNOSIS — I480 Paroxysmal atrial fibrillation: Secondary | ICD-10-CM | POA: Diagnosis not present

## 2018-11-25 DIAGNOSIS — I739 Peripheral vascular disease, unspecified: Secondary | ICD-10-CM | POA: Diagnosis not present

## 2018-11-25 DIAGNOSIS — I714 Abdominal aortic aneurysm, without rupture: Secondary | ICD-10-CM | POA: Insufficient documentation

## 2018-11-25 DIAGNOSIS — I6523 Occlusion and stenosis of bilateral carotid arteries: Secondary | ICD-10-CM

## 2018-11-25 DIAGNOSIS — I10 Essential (primary) hypertension: Secondary | ICD-10-CM

## 2018-11-25 LAB — BASIC METABOLIC PANEL
Anion gap: 7 (ref 5–15)
BUN: 17 mg/dL (ref 8–23)
CALCIUM: 9.3 mg/dL (ref 8.9–10.3)
CO2: 25 mmol/L (ref 22–32)
Chloride: 106 mmol/L (ref 98–111)
Creatinine, Ser: 1.12 mg/dL (ref 0.61–1.24)
GFR calc Af Amer: >60 mL/min (ref >60)
GFR calc non Af Amer: >60 mL/min (ref >60)
Glucose, Bld: 181 mg/dL — ABNORMAL HIGH (ref 70–99)
Potassium: 4 mmol/L (ref 3.5–5.1)
Sodium: 138 mmol/L (ref 135–145)

## 2018-11-25 NOTE — Progress Notes (Signed)
Patient ID: Troy Furukawa [name], male   DOB: 1941-02-21, 78 y.o.   MRN: [id number]     Advanced Heart Failure Clinic Note   PCP: Dr. Quay [name] Cardiology: Dr. [name] is a 78 y.o. male  with history of HTN, COPD, active smoking/COPD, carotid stenosis s/p right CEA, paroxysmal atrial fibrillation, and PAD returns for followup of CHF and atrial fibrillation.  He does not have known obstructive CAD but is at high risk for CAD based on his comorbidities.  Lexiscan Cardiolite in 3/14 showed no ischemia or infarction and echo in 3/14 showed normal EF.  He had a left fem-pop bypass at the [facility] in the '90s.  He is followed at [facility] for PAD. He has chronic right calf/thigh/buttocks claudication that is unchanged over the last few years and follows regularly at [facility].  He has had right lower lobectomy for lung cancer.  He continues to stay off cigarettes.    At a prior appointment, he was in atrial fibrillation but did not realize it. Started on Eliquis and diltiazem CD, and he spontaneously converted to NSR.  In 5/15, he was at [facility] and felt "strange" one day: fatigued, weak, short of breath.  He went to the ER and was in atrial fibrillation with HR in 80s-90s.  He spontaneously converted to NSR in the ER.  He felt back to normal after converting to NSR. Started him on Multaq 400 mg bid.  With CHF, he was eventually transitioned over to amiodarone.   He had patch angioplasty revision of left fem-pop bypass in 11/16.  Now with minimal claudication.  He follows with [facility].     He had a Cardiolite and echo in 7/17 that were unremarkable.  Repeat Cardiolite in 12/17 showed no ischemia, echo was uninterpretable.  Cardiac MRI was therefore done in 1/18, showing EF 66% with normal-appearing RV, no late gadolinium enhancement.   Given increased exertional dyspnea and a defect on Cardiolite, he had LHC in 11/18.  This showed no obstructive CAD. He had a chest CT done given concern for possible amiodarone  lung toxicity with increased dyspnea. This showed emphysema, no ILD.    In 5/19, he developed a probable viral syndrome with dehydration and had a presyncopal episode.  He was orthostatic in the hospital.  He was noted to be in atrial fibrillation this admission which remained persistent.  He had a TEE-guided DCCV back to NSR.   In 9/19, he was admitted with GI bleeding and found to have a sigmoid mass on colonoscopy, path showed sigmoid adenocarcinoma.  He was noted to have right paratracheal lymphadenopathy but EUS with biopsy in 10/19 did not show malignancy.  Eliquis was stopped with bleeding sigmoid mass.  He was noted to be back in atrial fibrillation this admission.   Amiodarone was stopped with elevated ESR and increased dyspnea.  He was also found to have new hyperthyroidism. He has been started on methimazole and following with endocrinology.  Colon cancer surgery has been on hold while hyperthyroidism is controlled.   Presents today for 1 week follow up. Seen last week for sick visit. Lasix increased and given doxy for possible AECOPD. He is feeling much better today. Weight down 7 lbs. Wife states his urine was very dark this am. Was on lasix 40 mg alternating 20 mg daily prior to recent increase. He has surgery planned for his colon cancer in early March. Pulse regular today. Having intermittent wheezing at home, but overall much  improved. Denies CP. No lightheadedness or dizziness.   Labs (3/13): LDL 75, HDL 35, K 4.1, creatinine 1.1 Labs (3/14): LDL 91, HDL 27 Labs (10/14): K 4.3, creatinine 0.98 Labs (4/15): K 4.9, creatinine 1.1, LDL 76, HDL 30 Labs (5/15): K 4.3, creatinine 1.7, BNP 251 Labs (6/15): K 4.4, creatinine 1.3 Labs (10/15): TSH normal Labs (5/16): K 4.2, creatinine 1.12, HCT 39.5, LFTs normal, LDL 84, HDL 43, TSH normal Labs (11/16): creatinine 0.94 Labs (4/17): LDL 39, HDL 39 Labs (6/17): K 4.5, creatinine 1.2 Labs (9/17): K 4, creatinine 1.25, HCT 38.7 Labs  (12/17): K 4.5, creatinine 1.49 => 1.62, BNP 43 Labs (2/18): K 3.8, creatinine 1.33, LFTs normal, TSH normal Labs (6/18): K 4.3, creatinine 1.49, LFTs normal, TSH normal, hgb 12.3 Labs (11/18): ESR 60, TSH normal, LFTs normal, creatinine 1.34 Labs (1/19): LDL 50 Labs (5/19): K 4.6, creatinine 1.55, LFTs normal, hgb 12.7, LFTs normal Labs (10/19): K 4 => 4.1, creatinine 1.39 => 1.43, BNP 279, hgb 10.7, TSH low, free T4 and free T3 low, ESR 108  PMH: 1. HTN: Fatigue and cough with ramipril use.  2. COPD: Quit smoking 2014. PFTs (1/18) with moderately severe COPD.  - CT chest (11/18): Emphysema noted, no ILD.  3. AAA: CT 1/13 with 3.0 cm AAA.  Abdominal US (1/14) with 3.4 cm AAA. Abdominal US (4/15) with 3.25 x 3.27 AAA. Abdominal US (3/16) with 3.7 cm AAA.  Followed at [facility].  - Abd [name] (1/19): 3.3 cm AAA. 4. Squamous cell lung cancer diagnosed 2/12.  Had right lower lobectomy in 4/12.  5. Hyperlipidemia 6. PAD: Left fem-pop bypass 1994.  ABIs (2/12) 0.62 on right, 0.95 on left. ABIs (1/14): 0.65 on right, 1.04 on left. ABIs (4/15) 0.66 right, 0.87 left.  ABIs (3/16) 0.6 right, 0.88 left.  Patch angioplasty left fem-pop bypass in 11/16.  - Left fem-pop bypass patent on doppler evaluation in 12/17.  7. Stress myoview 2004 was normal. Lexiscan Cardiolite (3/14) with EF 66%, no ischemia or infarction. Lexiscan Cardiolite (7/17) with EF 61%, no ischemia/infarction.  - Cardiolite (12/17) with EF 62%, inferior/inferolateral fixed defect, most likely diaphragmatic attenuation, no ischemia.  - Cardiolite (9/18) with EF > 65%, fixed inferior defect (attenuation versus infarction), no ischemia.  - LHC (11/18): No obstructive CAD.  8. Chronic diastolic CHF: Echo (1/75): technically difficult with EF 55-60%, upper normal RV size.  Echo (5/16) with EF 60-65%.  Echo (7/17) with EF 55-60%, normal RV size and systolic function.  - Echo 12/17 with very poor windows, unable to comment on LV or RV function.  -  Cardiac MRI (1/18) with EF 66%, normal RV size and systolic function.  - TEE (5/19): EF 55-60%, normal RV size and systolic function.  9. Carotid stenosis: TIA 10/14.  Carotid dopplers with > 10% RICA, 25-85% LICA.  Patient had right CEA in 10/14. Carotids (4/15) patent right CEA, LICA 27-78% stenosis.  Carotids (24/23) < 53% LICA, patent right CEA.  - Carotid dopplers (12/17): right CEA ok, < 61% LICA stenosis.  - Carotid dopplers (1/19): Right CEA ok, 4-43% LICA stenosis.  10. Atrial fibrillation: Paroxysmal. First noted after lobectomy in 4/12 (brief), recurrence in 4/15 then in 5/15.  - TEE-guided DCCV for persistent atrial fibrillation in 5/19.  11. Spinal stenosis 12. OSA: Using CPAP.  13. Glaucoma 14. Has ILR 15. Colon cancer: Sigmoid adenocarcinoma 16. 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   SH: Married, lives in [place].  Former [place].  Quit smoking in 2014.  Rare ETOH now.   FH: No premature CAD  Review of systems complete and found to be negative unless listed in HPI.    Current Outpatient Medications  Medication Sig Dispense Refill  . acetaminophen (TYLENOL) 325 MG tablet Take 650 mg every 6 (six) hours as needed by mouth for moderate pain or headache.    . albuterol (PROVENTIL HFA;VENTOLIN HFA) 108 (90 Base) MCG/ACT inhaler Inhale 2 puffs into the lungs every 6 (six) hours as needed for wheezing or shortness of breath. 1 Inhaler 0  . amLODipine (NORVASC) 5 MG tablet Take 1 tablet (5 mg total) by mouth daily. 30 tablet 11  . bimatoprost (LUMIGAN) 0.01 % SOLN Place 1 drop at bedtime into both eyes.     . budesonide-formoterol (SYMBICORT) 160-4.5 MCG/ACT inhaler Inhale 2 puffs into the lungs 2 (two) times daily.     [name] docusate sodium (COLACE) 100 MG capsule Take 100 mg by mouth daily.     . dorzolamide-timolol (COSOPT) 22.3-6.8 MG/ML ophthalmic solution Place 2 drops into both eyes 2 (two) times daily.     [name] doxycycline (VIBRAMYCIN) 100 MG  capsule Take 1 capsule (100 mg total) by mouth 2 (two) times daily. 28 capsule 0  . ferrous sulfate 325 (65 FE) MG tablet Take 325 mg by mouth 2 (two) times daily.     [name] FLUoxetine (PROZAC) 40 MG capsule Take 1 capsule (40 mg total) by mouth daily. 30 capsule 0  . furosemide (LASIX) 20 MG tablet Take 3 tabs (60 mg) for 3 days, then 2 tabs (40 mg) daily. 75 tablet 3  . Magnesium 500 MG CAPS Take 500 mg by mouth daily.     . Melatonin 5 MG TABS Take 5 mg by mouth at bedtime.    . methimazole (TAPAZOLE) 10 MG tablet Take 10 mg in am and 5 mg in pm (Patient taking differently: Take 5-10 mg by mouth See admin instructions. Take 5 mg by mouth in the morning and take 10 mg by mouth in the evening) 60 tablet 6  . metoprolol succinate (TOPROL-XL) 25 MG 24 hr tablet Take 1 tablet (25 mg total) by mouth daily. Take with or immediately following a meal. 90 tablet 3  . montelukast (SINGULAIR) 10 MG tablet TAKE ONE TABLET AT BEDTIME. (Patient taking differently: Take 10 mg by mouth at bedtime. ) 30 tablet 2  . omeprazole (PRILOSEC) 20 MG capsule Take 1 capsule (20 mg total) by mouth daily. 90 capsule 3  . Polyethyl Glycol-Propyl Glycol (SYSTANE) 0.4-0.3 % SOLN Place 1 drop into both eyes daily as needed (for dry eyes).     . potassium chloride SA (K-DUR,KLOR-CON) 20 MEQ tablet Take 1 tablet (20 mEq total) by mouth daily. 30 tablet 11  . rosuvastatin (CRESTOR) 40 MG tablet Take 1 tablet (40 mg total) by mouth daily. 30 tablet 11  . senna (SENOKOT) 8.6 MG tablet Take 1 tablet by mouth 2 (two) times daily.     No current facility-administered medications for this encounter.    Vitals:   11/25/18 1006  BP: 122/62  Pulse: (!) 51  SpO2: 96%  Weight: 83.9 kg (185 lb)    Wt Readings from Last 3 Encounters:  11/25/18 83.9 kg (185 lb)  11/18/18 87.7 kg (193 lb 6.4 oz)  11/17/18 85.3 kg (188 lb)    General: NAD HEENT: Normal Neck: Supple. JVP 6-7 cm. Carotids 2+ bilat; no bruits. No thyromegaly or nodule  noted. Cor: PMI nondisplaced.  RRR, No M/G/R noted Lungs: CTAB, normal effort. Abdomen: Soft, non-tender, non-distended, no HSM. No bruits or masses. +BS  Extremities: No cyanosis, clubbing, or rash. R and LLE no edema.  Neuro: Alert & orientedx3, cranial nerves grossly intact. moves all 4 extremities w/o difficulty. Affect pleasant   Assessment/Plan: 1. Chronic diastolic CHF: TEE in 5/52 showed LV EF 55-60% with normal RV size and systolic function. Seen last week and more SOB. Now much improved and volume stable to dry.  - Continue lasix 40 mg daily. BMET Today. May need to cut back based on labs. 2. Hyperlipidemia:  - Continue Crestor. Lipids today per [name].  3. Carotid stenosis: s/p R CEA.  Dopplers followed at [facility]. No change.  4. PAD: s/p patch angioplasty to left fem-pop bypass in 11/16.  Followed at [facility]. He is off cilostazol with CHF but his claudication has actually improved over time.  No change.  5. AAA: Stable on last [name], followed at [facility].   No change.  6. COPD: No longer smoking. PFTs in 1/18 showed moderately severe obstructive defect. CT chest in 11/18 showed emphysema. Lung sounds much improved. No obvious wheeze on exam. Decreased sounds in bases, R>L.   - He was started on fluticasone inhaler by his PCP.  Trying to avoid systemic steroids with need for bowel surgery soon.  - CXR (PA/lateral) 11/18/18 with no acute abnormality.  - Finishing course of doxy.  7. Atrial fibrillation: He is symptomatic when in atrial fibrillation.  Atrial fibrillation seems to worsen his CHF. He is off anticoagulation due to bleeding sigmoid adenocarcinoma. He will not be able to restart until he has had the sigmoid mass removed.  Additionally, TSH was low when checked in 10/19 concerning for amiodarone-related hyperthyroidism, and there was also concern for amiodarone lung toxicity. He is now off amiodarone. Regular on exam today. NSR by EKG last week.  - Continue Toprol XL 25 mg daily.  - He is  off Eliquis with colon tumor and bleeding, need to restart after surgery.  - Continue to limit ETOH.  - Continue to use CPAP.  8. CKD: Stage 3 - BMET today.  9. CAD: LHC in 11/18 with nonobstructive disease only.   - No s/s of ischemia.    10. 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  He is now on methimazole and follows with endocrinology. No change.  11. Colon cancer: Sigmoid adenocarcinoma. Needs surgical resection.  He is at moderate surgical risk from a cardiac standpoint but not prohibitive.  Continue Toprol XL peri-op and start back on Eliquis after surgery.   - Surgery was been delayed until hyperthyroidism controlled, now planned for early March. No change.  12. HTN:  - Meds as above.    Doing much better. Keep an eye on dark urine. Keep 2 month follow up with Dr. Leo Rod, PA-C  11/25/2018   Greater than 50% of the 25 minute visit was spent in counseling/coordination of care regarding disease state education, salt/fluid restriction, sliding scale diuretics, and medication compliance.

## 2018-11-27 ENCOUNTER — Other Ambulatory Visit: Payer: Self-pay

## 2018-11-27 ENCOUNTER — Ambulatory Visit (INDEPENDENT_AMBULATORY_CARE_PROVIDER_SITE_OTHER): Payer: Commercial Managed Care - PPO | Admitting: Internal Medicine

## 2018-11-27 ENCOUNTER — Encounter: Payer: Self-pay | Admitting: Internal Medicine

## 2018-11-27 VITALS — BP 150/60 | HR 55 | Ht 73.0 in | Wt 188.0 lb

## 2018-11-27 DIAGNOSIS — E0580 Other thyrotoxicosis without thyrotoxic crisis or storm: Secondary | ICD-10-CM | POA: Diagnosis not present

## 2018-11-27 DIAGNOSIS — E0590 Thyrotoxicosis, unspecified without thyrotoxic crisis or storm: Secondary | ICD-10-CM | POA: Diagnosis not present

## 2018-11-27 NOTE — Patient Instructions (Signed)
Please send me a message regarding current Methimazole dose.   Please stop at the lab.  Come back for a lab appt ~5-6 weeks after the surgery.  Please come back for a follow-up appointment in 4 months.

## 2018-11-27 NOTE — Progress Notes (Signed)
Patient ID: Troy Hinnenkamp [name], male   DOB: [birth year], 78 y.o.   MRN: [id number]    HPI  Troy Staup [name] is a 78 y.o.-year-old male, initially referred by his cardiologist, 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  He is here with his son who offers part of the history, especially about his recent medical history and previous investigations.    Of note, patient has a diagnosis of colon cancer and will have surgery next month.  We had to bring his thyroid tests is close to the normal range as soon as possible for him to continue with surgery.  Reviewed and addended history: Patient is seen in the cardiology clinic for several cardiovascular conditions: paroxysmal Afib, Diastolic CHF, CAD.  He also has a history of AAA, PAD, history of TIA.  He was found to have a low TSH at his visit on 07/14/2018.  He had repeat TFTs 9 days later and this showed again a suppressed TSH and also elevated free T4 and free T3.    She was taking a high dose of biotin, 10,000 mcg daily and he took this before his TFTs in 10-08/2018. He had a CT of the chest with contrast on 06/25/2018.  At that time, his amiodarone (that he was taking for almost 1 year) was stopped.    He was started on methimazole 10 mg daily.  He also started Toprol-XL 25 mg daily.  At last visit, we continued methimazole and also started prednisone 10 mg daily.  We were able to decrease the dose of prednisone and stop eventually in 10/2018.  He had to be off prednisone for several weeks before surgery. He does not feel worse after he stopped steroid.  We had to increase the dose of methimazole slightly to 15 mg daily.  He continues on this dose today.  Reviewed patient's TFTs: Lab Results  Component Value Date   TSH 0.02 (L) 10/27/2018   TSH <0.010 (L) 10/14/2018   TSH <0.01 (L) 09/26/2018   TSH <0.01 (L) 08/27/2018   TSH 0.010 (L) 07/23/2018   TSH 0.03 (L) 07/14/2018   TSH 0.751 06/24/2018   TSH  1.581 05/26/2018   TSH 1.519 08/05/2017   TSH 1.224 03/28/2017   FREET4 1.34 10/27/2018   FREET4 1.71 10/14/2018   FREET4 2.64 (H) 09/26/2018   FREET4 3.84 (H) 08/27/2018   FREET4 2.94 (H) 07/23/2018   FREET4 0.9 07/29/2006   T3FREE 3.0 10/27/2018   T3FREE 3.1 10/14/2018   T3FREE 3.9 09/26/2018   T3FREE 5.6 (H) 08/27/2018   T3FREE 4.7 (H) 07/23/2018   T3FREE 3.0 07/29/2006   Graves Ab's were negative: Lab Results  Component Value Date   TSI <89 08/27/2018   Pt denies: - feeling nodules in neck - hoarseness - dysphagia - choking - SOB with lying down  Pt denies: - Unintentional weight loss, but he did lose 10 pounds since last visit - heat intolerance - tremors - palpitations - anxiety - hyperdefecation - hair loss  Pt does not have a FH of thyroid ds. No FH of thyroid cancer. No h/o radiation tx to head or neck.  No seaweed or kelp. No recent contrast studies. No herbal supplements. No Biotin use. No recent steroids use.   Pt. also has a history of lung cancer, HL, COPD, OSA - on CPAP.  ROS: Constitutional: + see HPI Eyes: no blurry vision, no xerophthalmia ENT: no sore throat, + see HPI Cardiovascular: no CP/no SOB/no palpitations/no leg swelling  Respiratory: no cough/no SOB/no wheezing Gastrointestinal: no N/no V/no D/no C/no acid reflux Musculoskeletal: no muscle aches/no joint aches Skin: no rashes, no hair loss Neurological: no tremors/no numbness/no tingling/no dizziness  I reviewed pt's medications, allergies, PMH, social hx, family hx, and changes were documented in the history of present illness. Otherwise, unchanged from my initial visit note.  Past Medical History:  Diagnosis Date  . AAA (abdominal aortic aneurysm) ([place]) LAST ABDOMINAL US 10-20-17 3.3 CM   MONITORED BY DR [name]  . Arthritis   . Atrial fibrillation ([place])   . Bilateral carotid artery stenosis DUPLEX 12-29-2012  BY DR [facility]   BILATERAL ICA STENOSIS 60-79%  . BPH (benign  prostatic hypertrophy)   . Cancer ([place]) 01/25/2012   LUNG  . Complication of anesthesia    had nausea after carotid artery surgery, states the medicine given to help the nausea, made it worse  . COPD (chronic obstructive pulmonary disease) ([place])   . GERD (gastroesophageal reflux disease)   . Glaucoma BOTH EYES   Dr Gershon Crane  . History of basal cell carcinoma excision   . History of lung cancer APRIL 2012  SQUAMOUS CELL---- S/P RIGHT LOWER LOBECTOMY AT [facility] --  NO CHEMORADIATION---  NO RECURRENCE    ONCOLOGIST- DR Tressie Stalker  LOV IN [facility] 10-27-2012  . Hx of adenomatous colonic polyps 2005    X 2; 1 hyperplastic polyp; Dr Olevia Perches  . Hyperlipidemia   . Hypertension   . Impaired fasting glucose 2007   108; A1c5.4%  . Lesion of bladder   . Microhematuria   . OSA (obstructive sleep apnea) 08/29/2015   CPAP SET ON 10  . PAD (peripheral artery disease) ([place]) ABI'S  JAN 2014  0.65 ON RIGHT ;  1.04 ON LEFT  . Peripheral vascular disease ([place]) S/P ANGIOPLASTY AND STENTING   FOLLOWED  BY DR [name]  . Spinal stenosis Sept. 2015  . Status post placement of implantable loop recorder   . Stroke [facility]) Jul 07, 2013   mini  TIA  . Urgency of urination    Past Surgical History:  Procedure Laterality Date  . ANGIO PLASTY     X 4 in legs  . AORTOGRAM  07-27-2002   MILD DIFFUSE ILIAC ARTERY OCCLUSIVE DISEASE /  LEFT RENAL ARTERY 20%/ PATENT LEFT FEM-POP GRAFT/ MILD SFA AND POPLITEAL ARTERY OCCLUSIVE DISEASE W/ SEVERE KIDNEY OCCLUSIVE DISEASE  . BASAL CELL CARCINOMA EXCISION     MULTIPLE TIMES--  RIGHT FOREARM, CHEEKS, AND BACK  . BIOPSY  06/25/2018   Procedure: BIOPSY;  Surgeon: Rush Landmark Telford Nab., MD;  Location: [place];  Service: Gastroenterology;;  . CARDIOVASCULAR STRESS TEST  12-08-2012  DR MCLEAN   NORMAL LEXISCAN WITH NO EXERCISE NUCLEAR STUDY/ EF 66%/   NO ISCHEMIA/ NO SIGNIFICANT CHANGE FROM PRIOR STUDY  . CARDIOVERSION N/A 02/14/2018   Procedure: CARDIOVERSION;  Surgeon: Larey Dresser, MD;  Location: [facility] ENDOSCOPY;  Service: Cardiovascular;  Laterality: N/A;  . CAROTID ANGIOGRAM N/A 07/10/2013   Procedure: CAROTID ANGIOGRAM;  Surgeon: Elam Dutch, MD;  Location: [facility] CATH LAB;  Service: Cardiovascular;  Laterality: N/A;  . CAROTID ENDARTERECTOMY Right 07-14-13   cea  . CATARACT EXTRACTION W/ INTRAOCULAR LENS  IMPLANT, BILATERAL    . colon polyectomy    . COLONOSCOPY WITH PROPOFOL N/A 06/25/2018   Procedure: COLONOSCOPY WITH PROPOFOL;  Surgeon: Rush Landmark Telford Nab., MD;  Location: [place];  Service: Gastroenterology;  Laterality: N/A;  . CYSTOSCOPY W/ RETROGRADES Bilateral 01/21/2013   Procedure:  CYSTOSCOPY WITH RETROGRADE PYELOGRAM;  Surgeon: Molli Hazard, MD;  Location: [place];  Service: Urology;  Laterality: Bilateral;   CYSTO, BLADDER BIOPSY, BILATERAL RETROGRADE PYELOGRAM  RAD TECH FROM RADIOLOGY PER JOY  . CYSTOSCOPY WITH BIOPSY N/A 01/21/2013   Procedure: CYSTOSCOPY WITH BIOPSY;  Surgeon: Molli Hazard, MD;  Location: [facility];  Service: Urology;  Laterality: N/A;  . ENDARTERECTOMY Right 07/14/2013   Procedure: ENDARTERECTOMY CAROTID;  Surgeon: Angelia Mould, MD;  Location: [place];  Service: Vascular;  Laterality: Right;  . EP IMPLANTABLE DEVICE N/A 08/12/2015   Procedure: Loop Recorder Insertion;  Surgeon: Thompson Grayer, MD;  Location: [place] CV LAB;  Service: Cardiovascular;  Laterality: N/A;  . ESOPHAGOGASTRODUODENOSCOPY (EGD) WITH PROPOFOL N/A 06/25/2018   Procedure: ESOPHAGOGASTRODUODENOSCOPY (EGD) WITH PROPOFOL;  Surgeon: Rush Landmark Telford Nab., MD;  Location: [place];  Service: Gastroenterology;  Laterality: N/A;  . ESOPHAGOGASTRODUODENOSCOPY (EGD) WITH PROPOFOL N/A 07/10/2018   Procedure: ESOPHAGOGASTRODUODENOSCOPY (EGD) WITH PROPOFOL;  Surgeon: Milus Banister, MD;  Location: [facility] ENDOSCOPY;  Service: Endoscopy;  Laterality: N/A;  . EUS N/A 07/10/2018   Procedure: UPPER  ENDOSCOPIC ULTRASOUND (EUS) RADIAL;  Surgeon: Milus Banister, MD;  Location: [facility] ENDOSCOPY;  Service: Endoscopy;  Laterality: N/A;  . FEMORAL-POPLITEAL BYPASS GRAFT Left [facility]   AND 2001  [place]  . FEMORAL-POPLITEAL BYPASS GRAFT Left 08/30/2015   Procedure: REVISION OF BYPASS GRAFT Left  FEMORAL-POPLITEAL ARTERY;  Surgeon: Angelia Mould, MD;  Location: [place];  Service: Vascular;  Laterality: Left;  . FINE NEEDLE ASPIRATION N/A 07/10/2018   Procedure: FINE NEEDLE ASPIRATION (FNA) LINEAR;  Surgeon: Milus Banister, MD;  Location: [facility] ENDOSCOPY;  Service: Endoscopy;  Laterality: N/A;  . LARYNGOSCOPY  06-27-2004   BX VOCAL CORD  (LEUKOPLAKIA)  PER PT NO ISSUES SINCE  . LEFT HEART CATH AND CORONARY ANGIOGRAPHY N/A 08/20/2017   Procedure: LEFT HEART CATH AND CORONARY ANGIOGRAPHY;  Surgeon: Larey Dresser, MD;  Location: [place] CV LAB;  Service: Cardiovascular;  Laterality: N/A;  . LOWER EXTREMITY ANGIOGRAM Bilateral 08/29/2015   Procedure: Lower Extremity Angiogram;  Surgeon: Angelia Mould, MD;  Location: [place] CV LAB;  Service: Cardiovascular;  Laterality: Bilateral;  . LUNG LOBECTOMY  01/24/2011    RIGHT UPPER LOBE  (SQUAMOUS CELL CARCINOMA) Dr Dorthea Cove , [facility]. No chemotherapyor radiation  . PATCH ANGIOPLASTY Right 07/14/2013   Procedure: PATCH ANGIOPLASTY;  Surgeon: Angelia Mould, MD;  Location: [place];  Service: Vascular;  Laterality: Right;  . PATCH ANGIOPLASTY Left 08/30/2015   Procedure: VEIN PATCH ANGIOPLASTY OF PROXIMAL Left BYPASS GRAFT;  Surgeon: Angelia Mould, MD;  Location: [place];  Service: Vascular;  Laterality: Left;  . PERIPHERAL VASCULAR CATHETERIZATION N/A 08/29/2015   Procedure: Abdominal Aortogram;  Surgeon: Angelia Mould, MD;  Location: [place] CV LAB;  Service: Cardiovascular;  Laterality: N/A;  . POLYPECTOMY  06/25/2018   Procedure: POLYPECTOMY;  Surgeon: Rush Landmark Telford Nab., MD;  Location: [place];   Service: Gastroenterology;;  . Lia Foyer INJECTION  06/25/2018   Procedure: SUBMUCOSAL INJECTION;  Surgeon: Irving Copas., MD;  Location: [place];  Service: Gastroenterology;;  . TEE WITHOUT CARDIOVERSION N/A 02/14/2018   Procedure: TRANSESOPHAGEAL ECHOCARDIOGRAM (TEE);  Surgeon: Larey Dresser, MD;  Location: [facility] ENDOSCOPY;  Service: Cardiovascular;  Laterality: N/A;  . trabecular surgery     OS  . TRANSTHORACIC ECHOCARDIOGRAM  12-29-2012  DR [facility]   MILD LVH/  LVSF NORMAL/ EF 55-60%/  GRADE I DIASTOLIC DYSFUNCTION   Social History   Socioeconomic History  . Marital status: Married    Spouse name: [name]   . Number of children: 3  . Years of education: 12+  . Highest education level: Not on file  Occupational History  . 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 Financial resource strain: Not on file  . Food insecurity:    Worry: Not on file    Inability: Not on file  . Transportation needs:    Medical: Not on file    Non-medical: Not on file  Tobacco Use  . Smoking status: Former Smoker    Packs/day: 0.50    Years: 50.00    Pack years: 25.00    Types: Cigarettes    Last attempt to quit: 07/28/2014    Years since quitting: 4.3  . Smokeless tobacco: Never Used  Substance and Sexual Activity  . Alcohol use: Yes    Alcohol/week: 0.0 standard drinks    Comment:  socially, variable  . Drug use: No  . Sexual activity: Not on file  Lifestyle  . Physical activity:    Days per week: Not on file    Minutes per session: Not on file  . Stress: Not on file  Relationships  . Social connections:    Talks on phone: Not on file    Gets together: Not on file    Attends religious service: Not on file    Active member of club or organization: Not on file    Attends meetings of clubs or organizations: Not on file    Relationship status:  Not on file  . Intimate partner violence:    Fear of current or ex partner: Not on file    Emotionally abused: Not on file    Physically abused: Not on file    Forced sexual activity: Not on file  Other Topics Concern  . Not on file  Social History Narrative   Patient lives at home with spouse [name]   Patient has 3 children    Patient is right handed    Current Outpatient Medications on File Prior to Visit  Medication Sig Dispense Refill  . acetaminophen (TYLENOL) 325 MG tablet Take 650 mg every 6 (six) hours as needed by mouth for moderate pain or headache.    . albuterol (PROVENTIL HFA;VENTOLIN HFA) 108 (90 Base) MCG/ACT inhaler Inhale 2 puffs into the lungs every 6 (six) hours as needed for wheezing or shortness of breath. 1 Inhaler 0  . amLODipine (NORVASC) 5 MG tablet Take 1 tablet (5 mg total) by mouth daily. 30 tablet 11  . bimatoprost (LUMIGAN) 0.01 % SOLN Place 1 drop at bedtime into both eyes.     . budesonide-formoterol (SYMBICORT) 160-4.5 MCG/ACT inhaler Inhale 2 puffs into the lungs 2 (two) times daily.     [name] docusate sodium (COLACE) 100 MG capsule Take 100 mg by mouth daily.     . dorzolamide-timolol (COSOPT) 22.3-6.8 MG/ML ophthalmic solution Place 2 drops into both eyes 2 (two) times daily.     [name] doxycycline (VIBRAMYCIN) 100 MG capsule Take 1 capsule (100 mg total) by mouth 2 (two) times daily. 28 capsule 0  . ferrous sulfate 325 (65 FE) MG tablet Take 325 mg by mouth 2 (two) times daily.     [name] FLUoxetine (PROZAC) 40 MG capsule Take  1 capsule (40 mg total) by mouth daily. 30 capsule 0  . furosemide (LASIX) 20 MG tablet Take 3 tabs (60 mg) for 3 days, then 2 tabs (40 mg) daily. 75 tablet 3  . Magnesium 500 MG CAPS Take 500 mg by mouth daily.     . Melatonin 5 MG TABS Take 5 mg by mouth at bedtime.    . methimazole (TAPAZOLE) 10 MG tablet Take 10 mg in am and 5 mg in pm (Patient taking differently: Take 5-10 mg by mouth See admin instructions. Take 5 mg by mouth in the  morning and take 10 mg by mouth in the evening) 60 tablet 6  . metoprolol succinate (TOPROL-XL) 25 MG 24 hr tablet Take 1 tablet (25 mg total) by mouth daily. Take with or immediately following a meal. 90 tablet 3  . montelukast (SINGULAIR) 10 MG tablet TAKE ONE TABLET AT BEDTIME. (Patient taking differently: Take 10 mg by mouth at bedtime. ) 30 tablet 2  . omeprazole (PRILOSEC) 20 MG capsule Take 1 capsule (20 mg total) by mouth daily. 90 capsule 3  . Polyethyl Glycol-Propyl Glycol (SYSTANE) 0.4-0.3 % SOLN Place 1 drop into both eyes daily as needed (for dry eyes).     . potassium chloride SA (K-DUR,KLOR-CON) 20 MEQ tablet Take 1 tablet (20 mEq total) by mouth daily. 30 tablet 11  . rosuvastatin (CRESTOR) 40 MG tablet Take 1 tablet (40 mg total) by mouth daily. 30 tablet 11  . senna (SENOKOT) 8.6 MG tablet Take 1 tablet by mouth 2 (two) times daily.     No current facility-administered medications on file prior to visit.    Allergies  Allergen Reactions  . Niacin-Lovastatin Er Shortness Of Breath and Other (See Comments)    Dyspnea, flushing  . Penicillins Hives, Shortness Of Breath and Other (See Comments)    Flushing  & dyspnea Because of a history of documented adverse serious drug reaction;Medi Alert bracelet  is recommended PCN reaction causing immediate rash, facial/tongue/throat swelling, SOB or lightheadedness with hypotension: Yes PCN reaction causing severe rash involving mucus membranes or skin necrosis: No PCN reaction occurring within the last 10 years: NO PCN reaction that required hospitalization: NO  . Sulfonamide Derivatives Hives, Shortness Of Breath and Other (See Comments)    Flushing & dyspnea Because of a history of documented adverse serious drug reaction;Medi Alert bracelet  is recommended  . Atorvastatin Other (See Comments)    Myalgias & athralgias- takes Crestor, DOES NOT LIKE LIPITOR   Family History  Problem Relation Age of Onset  . Stroke Mother         mini strokes  . Alcohol abuse Father   . Heart disease Father        MI after 9  . Stroke Father   . Hypertension Father   . Heart attack Father   . Heart disease Paternal Aunt        several  . Hypertension Paternal Aunt        several  . Stroke Paternal Aunt        several  . Stroke Paternal Uncle        several  . Heart disease Paternal Uncle        several;2 had MI pre 27  . Cancer Daughter 56       breast ca, also with benign sessile polyp     PE: BP (!) 150/60   Pulse (!) 55   Ht 6\' 1"  (1.854 m)  Wt 188 lb (85.3 kg)   SpO2 94%   BMI 24.80 kg/m  Wt Readings from Last 3 Encounters:  11/27/18 188 lb (85.3 kg)  11/25/18 185 lb (83.9 kg)  11/18/18 193 lb 6.4 oz (87.7 kg)   Constitutional: overweight, in NAD Eyes: PERRLA, EOMI, no exophthalmos ENT: moist mucous membranes, no thyromegaly, no cervical lymphadenopathy Cardiovascular: RRR, No MRG Respiratory: CTA B Gastrointestinal: abdomen soft, NT, ND, BS+ Musculoskeletal: no deformities, strength intact in all 4 Skin: moist, warm, no rashes Neurological: no tremor with outstretched hands, DTR normal in all 4  ASSESSMENT: 1.  Amiodarone-induced thyrotoxicosis  PLAN:  1. Patient with a relatively recent diagnosis of thyrotoxicosis, without hyperthyroid symptoms with the possible exception of weight loss.  However, patient did lose weight (approximately 6 pounds before last visit and 10 more pounds since then, but he mentions this was intentional).  He denied heat intolerance, palpitations, anxiety, hyper defecation.  He does have a history of PAF and is on amiodarone.  Around the time of his investigation for hypothyroidism she was also taking a high dose of biotin which is known to influence the TFTs and mimic thyrotoxicosis, and he also had a contrasted CT scan which could have influence the thyroid test in the same way. -At last visit, his TSH was undetectable and the free thyroid hormones were high.  He was already  started on methimazole 10 mg daily and we continued this but increase the dose to 50 mg daily.  We continued his Toprol XL.  Due to suspicion for amiodarone induced thyrotoxicosis in the setting of normal TSI antibodies (making Graves' disease less likely), we started prednisone 10 mg daily.  We decreased the dose and was able to stop prednisone completely in 10/2018.  - Of note, patient has a history of colon cancer and will need to have colectomy.  For this, he needs to be off the prednisone for several weeks.  We discussed with patient and his son about possible surgical risks if he is on prednisone.  His surgery is scheduled for 12/12/2018. -At today's visit, he tells me that he felt better after he started prednisone but he did not feel any difference after he stopped it a month ago.  We will check his TFTs today and I am hopeful that we do not need to add prednisone back, since this will postpone his surgery even more. -We will continue the same dose of methimazole for now, but there is confusion about the dose that he is actually taking.  I advised him to look at home and let me know. - RTC in 4 months, but sooner for repeat labs, approximately 5 to 6 weeks after the surgery  When the results are back, he needs sx clearance sent to Dr. Dema Severin - [facility].  - time spent with the patient: 15 min, of which >50% was spent in obtaining information about his symptoms, reviewing his previous labs, evaluations, and treatments, counseling him about his condition (please see the discussed topics above), and developing a plan to further investigate and treat it; he had a number of questions which I addressed.  Office Visit on 11/27/2018  Component Date Value Ref Range Status  . Free T4 11/27/2018 0.90  0.60 - 1.60 ng/dL Final   Comment: Specimens from patients who are undergoing biotin therapy and /or ingesting biotin supplements may contain high levels of biotin.  The higher biotin concentration in these specimens  interferes with this Free T4 assay.  Specimens that  contain high levels  of biotin may cause false high results for this Free T4 assay.  Please interpret results in light of the total clinical presentation of the patient.    . T3, Free 11/27/2018 2.9  2.3 - 4.2 pg/mL Final  . TSH 11/27/2018 3.57  0.35 - 4.50 uIU/mL Final   Msg sent: Dear Mr. Lucita Lora, Excellent news: your thyroid tests are all normal now! Please let me know what dose of Methimazole you are actually taking as we will need to reduce this. Sincerely, Philemon Kingdom MD   Philemon Kingdom, MD PhD [facility] Endocrinology

## 2018-11-28 ENCOUNTER — Telehealth: Payer: Self-pay

## 2018-11-28 LAB — T3, FREE: T3, Free: 2.9 pg/mL (ref 2.3–4.2)

## 2018-11-28 LAB — T4, FREE: Free T4: 0.9 ng/dL (ref 0.60–1.60)

## 2018-11-28 LAB — TSH: TSH: 3.57 u[IU]/mL (ref 0.35–4.50)

## 2018-11-28 NOTE — Telephone Encounter (Signed)
Notified patient of message from Dr. Cruzita Lederer, patient expressed understanding and agreement. No further questions.  RX changed.

## 2018-11-28 NOTE — Telephone Encounter (Signed)
For now, decrease the dose to 10 mg in the a.m. only.  Melissa, can you make this change on his medication list?

## 2018-11-28 NOTE — Telephone Encounter (Signed)
Patients wife called today requesting lab results-I gave her the results and asked her what his current dose of methimazole is- she stated he is taking 10 mg in the morning and 5 mg in the evening- please call his wife back at [phone] with the dosage change

## 2018-12-01 ENCOUNTER — Ambulatory Visit (INDEPENDENT_AMBULATORY_CARE_PROVIDER_SITE_OTHER): Payer: Commercial Managed Care - PPO | Admitting: *Deleted

## 2018-12-01 DIAGNOSIS — I480 Paroxysmal atrial fibrillation: Secondary | ICD-10-CM

## 2018-12-01 LAB — CUP PACEART REMOTE DEVICE CHECK
Date Time Interrogation Session: 20200302113033
Implantable Pulse Generator Implant Date: 20161111

## 2018-12-02 ENCOUNTER — Encounter (HOSPITAL_COMMUNITY): Payer: Self-pay

## 2018-12-02 NOTE — Pre-Procedure Instructions (Signed)
Cardiac clearance Dr. Chalmers Cater in note on 11/25/2018 in epic EKG 11/18/2018 in peic CXR 11/19/2018 in epic Loop recorder check 12/01/2018 in epic

## 2018-12-02 NOTE — Patient Instructions (Addendum)
Your procedure is scheduled on: Friday, December 12, 2018   Surgery Time:  7:30AM-11:00AM   Report to [facility] Main  Entrance    Report to admitting at 5:30 AM   Call this number if you have problems the morning of surgery [phone]   Bring CPAP mask and tubing day of surgery   Drink plenty of fluids the day before surgery to prevent dehydration   Drink 2 Ensure drinks the night before surgery.      Miralax:  Mix with 64 oz Gatorade/Powerade. Drink gradually over the next few hours (8 oz glass every 15-30 minutes) until gone the day prior to surgery.   Neomycin:  At 2 pm, 3 pm and 10 pm after Miralax bowel prep the day prior to surgery.   Metronidazole:  At 2 pm, 3 pm and 10 pm after Miralax bowel prep the day prior to surgery.   Do not eat food:After Midnight.   May have liquids from [place] until 4:30 AM day of surgery   CLEAR LIQUID DIET  Foods Allowed                                                                     Foods Excluded  Water, Black Coffee and tea, regular and decaf                             liquids that you cannot  Plain Jell-O in any flavor                                             see through such as: Fruit ices (not with fruit pulp)                                     milk, soups, orange juice  Iced Popsicles                                    All solid food Carbonated beverages, regular and diet                                    Cranberry, grape and apple juices Sports drinks like Gatorade Lightly seasoned clear broth or consume(fat free) Sugar, honey syrup  Sample Menu Breakfast                                Lunch                                     Supper Cranberry juice                    Beef broth  Chicken broth Jell-O                                     Grape juice                           Apple juice Coffee or tea                        Jell-O                                       Popsicle                                                Coffee or tea                        Coffee or tea   Complete one Ensure drink the morning of surgery at 4:30AM prior to scheduled surgery.   Brush your teeth the morning of surgery.   Do NOT smoke after Midnight   Take these medicines the morning of surgery with A SIP OF WATER: Amlodipine (if normally takes in the morning), Fluoxetine, Methimazole, Metoprolol (if normally takes in the morning)   Use Inhaler per normal routine   Use eye drops per normal routine   Bring rescue inhaler day of surgery                               You may not have any metal on your body including jewelry, and body piercings             Do not wear lotions, powders, perfumes/cologne, or deodorant                           Men may shave face and neck.   Do not bring valuables to the hospital. [name].   Contacts, dentures or bridgework may not be worn into surgery.   Leave suitcase in the car. After surgery it may be brought to your room.    Special Instructions: Bring a copy of your healthcare power of attorney and living will documents         the day of surgery if you haven't scanned them in before.              Please read over the following fact sheets you were given:  [facility] - Preparing for Surgery Before surgery, you can play an important role.  Because skin is not sterile, your skin needs to be as free of germs as possible.  You can reduce the number of germs on your skin by washing with CHG (chlorahexidine gluconate) soap before surgery.  CHG is an antiseptic cleaner which kills germs and bonds with the skin to continue killing germs even after washing. Please DO NOT use if you have an allergy to CHG or antibacterial soaps.  If your  skin becomes reddened/irritated stop using the CHG and inform your nurse when you arrive at Short Stay. Do not shave (including legs and underarms) for  at least 48 hours prior to the first CHG shower.  You may shave your face/neck.  Please follow these instructions carefully:  1.  Shower with CHG Soap the night before surgery and the  morning of surgery.  2.  If you choose to wash your hair, wash your hair first as usual with your normal  shampoo.  3.  After you shampoo, rinse your hair and body thoroughly to remove the shampoo.                             4.  Use CHG as you would any other liquid soap.  You can apply chg directly to the skin and wash.  Gently with a scrungie or clean washcloth.  5.  Apply the CHG Soap to your body ONLY FROM THE NECK DOWN.   Do   not use on face/ open                           Wound or open sores. Avoid contact with eyes, ears mouth and   genitals (private parts).                       Wash face,  Genitals (private parts) with your normal soap.             6.  Wash thoroughly, paying special attention to the area where your    surgery  will be performed.  7.  Thoroughly rinse your body with warm water from the neck down.  8.  DO NOT shower/wash with your normal soap after using and rinsing off the CHG Soap.                9.  Pat yourself dry with a clean towel.            10.  Wear clean pajamas.            11.  Place clean sheets on your bed the night of your first shower and do not  sleep with pets. Day of Surgery : Do not apply any lotions/deodorants the morning of surgery.  Please wear clean clothes to the hospital/surgery center.  FAILURE TO FOLLOW THESE INSTRUCTIONS MAY RESULT IN THE CANCELLATION OF YOUR SURGERY  PATIENT SIGNATURE_________________________________  NURSE SIGNATURE__________________________________  ________________________________________________________________________   Adam Phenix  An incentive spirometer is a tool that can help keep your lungs clear and active. This tool measures how well you are filling your lungs with each breath. Taking long deep breaths may help  reverse or decrease the chance of developing breathing (pulmonary) problems (especially infection) following:  A long period of time when you are unable to move or be active. BEFORE THE PROCEDURE   If the spirometer includes an indicator to show your best effort, your nurse or respiratory therapist will set it to a desired goal.  If possible, sit up straight or lean slightly forward. Try not to slouch.  Hold the incentive spirometer in an upright position. INSTRUCTIONS FOR USE  1. Sit on the edge of your bed if possible, or sit up as far as you can in bed or on a chair. 2. Hold the incentive spirometer in an upright position. 3. Breathe out normally. 4.  Place the mouthpiece in your mouth and seal your lips tightly around it. 5. Breathe in slowly and as deeply as possible, raising the piston or the ball toward the top of the column. 6. Hold your breath for 3-5 seconds or for as long as possible. Allow the piston or ball to fall to the bottom of the column. 7. Remove the mouthpiece from your mouth and breathe out normally. 8. Rest for a few seconds and repeat Steps 1 through 7 at least 10 times every 1-2 hours when you are awake. Take your time and take a few normal breaths between deep breaths. 9. The spirometer may include an indicator to show your best effort. Use the indicator as a goal to work toward during each repetition. 10. After each set of 10 deep breaths, practice coughing to be sure your lungs are clear. If you have an incision (the cut made at the time of surgery), support your incision when coughing by placing a pillow or rolled up towels firmly against it. Once you are able to get out of bed, walk around indoors and cough well. You may stop using the incentive spirometer when instructed by your caregiver.  RISKS AND COMPLICATIONS  Take your time so you do not get dizzy or light-headed.  If you are in pain, you may need to take or ask for pain medication before doing incentive  spirometry. It is harder to take a deep breath if you are having pain. AFTER USE  Rest and breathe slowly and easily.  It can be helpful to keep track of a log of your progress. Your caregiver can provide you with a simple table to help with this. If you are using the spirometer at home, follow these instructions: [place] IF:   You are having difficultly using the spirometer.  You have trouble using the spirometer as often as instructed.  Your pain medication is not giving enough relief while using the spirometer.  You develop fever of 100.5 F (38.1 C) or higher. SEEK IMMEDIATE MEDICAL CARE IF:   You cough up bloody sputum that had not been present before.  You develop fever of 102 F (38.9 C) or greater.  You develop worsening pain at or near the incision site. MAKE SURE YOU:   Understand these instructions.  Will watch your condition.  Will get help right away if you are not doing well or get worse. Document Released: 01/28/2007 Document Revised: 12/10/2011 Document Reviewed: 03/31/2007 [employer] Patient Information 2014 [employer], [place].   ________________________________________________________________________  WHAT IS A BLOOD TRANSFUSION? Blood Transfusion Information  A transfusion is the replacement of blood or some of its parts. Blood is made up of multiple cells which provide different functions.  Red blood cells carry oxygen and are used for blood loss replacement.  White blood cells fight against infection.  Platelets control bleeding.  Plasma helps clot blood.  Other blood products are available for specialized needs, such as hemophilia or other clotting disorders. BEFORE THE TRANSFUSION  Who gives blood for transfusions?   Healthy volunteers who are fully evaluated to make sure their blood is safe. This is blood bank blood. Transfusion therapy is the safest it has ever been in the practice of medicine. Before blood is taken from a donor, a  complete history is taken to make sure that person has no history of diseases nor engages in risky social behavior (examples are intravenous drug use or sexual activity with multiple partners). The donor's travel history is  screened to minimize risk of transmitting infections, such as malaria. The donated blood is tested for signs of infectious diseases, such as HIV and hepatitis. The blood is then tested to be sure it is compatible with you in order to minimize the chance of a transfusion reaction. If you or a relative donates blood, this is often done in anticipation of surgery and is not appropriate for emergency situations. It takes many days to process the donated blood. RISKS AND COMPLICATIONS Although transfusion therapy is very safe and saves many lives, the main dangers of transfusion include:   Getting an infectious disease.  Developing a transfusion reaction. This is an allergic reaction to something in the blood you were given. Every precaution is taken to prevent this. The decision to have a blood transfusion has been considered carefully by your caregiver before blood is given. Blood is not given unless the benefits outweigh the risks. AFTER THE TRANSFUSION  Right after receiving a blood transfusion, you will usually feel much better and more energetic. This is especially true if your red blood cells have gotten low (anemic). The transfusion raises the level of the red blood cells which carry oxygen, and this usually causes an energy increase.  The nurse administering the transfusion will monitor you carefully for complications. HOME CARE INSTRUCTIONS  No special instructions are needed after a transfusion. You may find your energy is better. Speak with your caregiver about any limitations on activity for underlying diseases you may have. SEEK MEDICAL CARE IF:   Your condition is not improving after your transfusion.  You develop redness or irritation at the intravenous (IV)  site. SEEK IMMEDIATE MEDICAL CARE IF:  Any of the following symptoms occur over the next 12 hours:  Shaking chills.  You have a temperature by mouth above 102 F (38.9 C), not controlled by medicine.  Chest, back, or muscle pain.  People around you feel you are not acting correctly or are confused.  Shortness of breath or difficulty breathing.  Dizziness and fainting.  You get a rash or develop hives.  You have a decrease in urine output.  Your urine turns a dark color or changes to pink, red, or brown. Any of the following symptoms occur over the next 10 days:  You have a temperature by mouth above 102 F (38.9 C), not controlled by medicine.  Shortness of breath.  Weakness after normal activity.  The white part of the eye turns yellow (jaundice).  You have a decrease in the amount of urine or are urinating less often.  Your urine turns a dark color or changes to pink, red, or brown. Document Released: 09/14/2000 Document Revised: 12/10/2011 Document Reviewed: 05/03/2008 [facility] Patient Information 2014 [place], [place].  _______________________________________________________________________

## 2018-12-03 ENCOUNTER — Other Ambulatory Visit: Payer: Self-pay

## 2018-12-03 ENCOUNTER — Encounter (HOSPITAL_COMMUNITY)
Admission: RE | Admit: 2018-12-03 | Discharge: 2018-12-03 | Disposition: A | Discharge status: Home / Self Care | Payer: Commercial Managed Care - PPO | Source: Ambulatory Visit | Attending: Surgery | Admitting: Surgery

## 2018-12-03 ENCOUNTER — Encounter (HOSPITAL_COMMUNITY): Payer: Commercial Managed Care - PPO

## 2018-12-03 ENCOUNTER — Encounter (HOSPITAL_COMMUNITY): Payer: Self-pay

## 2018-12-03 DIAGNOSIS — Z01812 Encounter for preprocedural laboratory examination: Secondary | ICD-10-CM | POA: Insufficient documentation

## 2018-12-03 HISTORY — DX: Thyrotoxicosis, unspecified without thyrotoxic crisis or storm: E05.90

## 2018-12-03 HISTORY — DX: Malignant neoplasm of unspecified part of unspecified bronchus or lung: C34.90

## 2018-12-03 HISTORY — DX: Personal history of other diseases of the respiratory system: Z87.09

## 2018-12-03 HISTORY — DX: Pleural effusion, not elsewhere classified: J90

## 2018-12-03 HISTORY — DX: Depression, unspecified: F32.A

## 2018-12-03 HISTORY — DX: Chronic kidney disease, unspecified: N18.9

## 2018-12-03 HISTORY — DX: Anxiety disorder, unspecified: F41.9

## 2018-12-03 HISTORY — DX: Chronic diastolic (congestive) heart failure: I50.32

## 2018-12-03 HISTORY — DX: Basal cell carcinoma of skin, unspecified: C44.91

## 2018-12-03 HISTORY — DX: Malignant neoplasm of colon, unspecified: C18.9

## 2018-12-03 HISTORY — DX: Atherosclerosis of aorta: I70.0

## 2018-12-03 HISTORY — DX: Major depressive disorder, single episode, unspecified: F32.9

## 2018-12-03 LAB — PROTIME-INR
INR: 1 (ref 0.8–1.2)
Prothrombin Time: 13.5 seconds (ref 11.4–15.2)

## 2018-12-03 LAB — COMPREHENSIVE METABOLIC PANEL
ALT: 26 U/L (ref 0–44)
AST: 25 U/L (ref 15–41)
Albumin: 3.2 g/dL — ABNORMAL LOW (ref 3.5–5.0)
Alkaline Phosphatase: 117 U/L (ref 38–126)
Anion gap: 8 (ref 5–15)
BUN: 19 mg/dL (ref 8–23)
CO2: 24 mmol/L (ref 22–32)
Calcium: 9.1 mg/dL (ref 8.9–10.3)
Chloride: 107 mmol/L (ref 98–111)
Creatinine, Ser: 1.06 mg/dL (ref 0.61–1.24)
GFR calc Af Amer: >60 mL/min (ref >60)
GFR calc non Af Amer: >60 mL/min (ref >60)
Glucose, Bld: 91 mg/dL (ref 70–99)
Potassium: 4.4 mmol/L (ref 3.5–5.1)
Sodium: 139 mmol/L (ref 135–145)
Total Bilirubin: 0.6 mg/dL (ref 0.3–1.2)
Total Protein: 7 g/dL (ref 6.5–8.1)

## 2018-12-03 LAB — CBC WITH DIFFERENTIAL/PLATELET
Abs Immature Granulocytes: 0.05 10*3/uL (ref 0.00–0.07)
Basophils Absolute: 0 10*3/uL (ref 0.0–0.1)
Basophils Relative: 0 %
EOS ABS: 0.2 10*3/uL (ref 0.0–0.5)
Eosinophils Relative: 2 %
HCT: 39.9 % (ref 39.0–52.0)
Hemoglobin: 12.4 g/dL — ABNORMAL LOW (ref 13.0–17.0)
Immature Granulocytes: 1 %
Lymphocytes Relative: 14 %
Lymphs Abs: 1.1 10*3/uL (ref 0.7–4.0)
MCH: 28.4 pg (ref 26.0–34.0)
MCHC: 31.1 g/dL (ref 30.0–36.0)
MCV: 91.3 fL (ref 80.0–100.0)
Monocytes Absolute: 0.7 10*3/uL (ref 0.1–1.0)
Monocytes Relative: 9 %
Neutro Abs: 5.8 10*3/uL (ref 1.7–7.7)
Neutrophils Relative %: 74 %
Platelets: 321 10*3/uL (ref 150–400)
RBC: 4.37 MIL/uL (ref 4.22–5.81)
RDW: 16.5 % — ABNORMAL HIGH (ref 11.5–15.5)
WBC: 7.8 10*3/uL (ref 4.0–10.5)
nRBC: 0 % (ref 0.0–0.2)

## 2018-12-03 LAB — HEMOGLOBIN A1C
HEMOGLOBIN A1C: 6.3 % — AB (ref 4.8–5.6)
Mean Plasma Glucose: 134.11 mg/dL

## 2018-12-03 LAB — ABO/RH: ABO/RH(D): A POS

## 2018-12-03 LAB — APTT: aPTT: 30 seconds (ref 24–36)

## 2018-12-03 NOTE — Pre-Procedure Instructions (Signed)
CBC/diff and Hgb A1C results sent to Dr. Dema Severin via epic.  Chart sent to [facility].A. for review

## 2018-12-07 NOTE — Anesthesia Preprocedure Evaluation (Addendum)
Anesthesia Evaluation  Patient identified by MRN, date of birth, ID band Patient awake    Reviewed: Allergy & Precautions, NPO status , Patient's Chart, lab work & pertinent test results  Airway Mallampati: II   Neck ROM: full    Dental   Pulmonary sleep apnea , COPD, former smoker,  H/o lung CA s/p RLL lobectomy   breath sounds clear to auscultation       Cardiovascular hypertension, + Peripheral Vascular Disease, +CHF and + DOE  + dysrhythmias Atrial Fibrillation  Rhythm:regular Rate:Normal  AAA 3.3 cm   Neuro/Psych    GI/Hepatic GERD  ,  Endo/Other  Hyperthyroidism   Renal/GU Renal disease     Musculoskeletal  (+) Arthritis ,   Abdominal   Peds  Hematology   Anesthesia Other Findings   Reproductive/Obstetrics                           Anesthesia Physical Anesthesia Plan  ASA: III  Anesthesia Plan: General   Post-op Pain Management:    Induction: Intravenous  PONV Risk Score and Plan: 2 and Ondansetron, Dexamethasone, Midazolam and Treatment may vary due to age or medical condition  Airway Management Planned: Oral ETT  Additional Equipment:   Intra-op Plan:   Post-operative Plan: Extubation in OR  Informed Consent: I have reviewed the patients History and Physical, chart, labs and discussed the procedure including the risks, benefits and alternatives for the proposed anesthesia with the patient or authorized representative who has indicated his/her understanding and acceptance.       Plan Discussed with: CRNA, Anesthesiologist and Surgeon  Anesthesia Plan Comments: (See PAT note 12/03/18, Konrad Felix, PA-C)       Anesthesia Quick Evaluation

## 2018-12-07 NOTE — Progress Notes (Signed)
Anesthesia Chart Review   Case:  [id number] Date/Time:  12/12/18 0700   Procedures:      XI ROBOT POSSIBLE OPEN  SIGMOID COLECTOMY POSSIBLE TAKEDOWN OF SPLENIC FLEXURE ERAS PATHWAY (N/A )     FLEXIBLE SIGMOIDOSCOPY (N/A )   Anesthesia type:  General   Pre-op diagnosis:  sigmoid colon cancer   Location:  [place] [facility]   Surgeon:  Ileana Roup, MD      DISCUSSION: 78 yo former smoker (25 pack years, quit 07/28/14) with h/o PONV, HTN, GERD, PVD (s/p angioplasty and stenting 2016), stroke (2014), carotid artery stenosis (s/p right CEA 2014), OSA with CPAP, CKD, depression, anxiety, CHF, A-fib (on Eliquis), loop recorder in place, COPD, AAA (Abdominal US 10/20/17), BPH, HLD, h/o lung cancer (s/p right lower lobectomy 2012), sigmoid colon cancer scheduled for above procedure 12/12/18 with Dr. Nadeen Landau.   Pt last seen by cardiology 11/25/18.  Seen by Barrington Ellison, PA-C.  Per his note, "He is at moderate surgical risk from a cardiac standpoint but not prohibitive.  Continue Toprol XL peri-op and start back on Eliquis after surgery."  Surgery previously postponed due to diagnosis of thyrotoxicosis, likely due to amiodarone, biotin, and contrast CT per endocrinology note.  Followed by endocrinologist, Dr. Philemon Kingdom.  Last seen 11/27/18.  Per this note pt has discontinued prednisone 10/2018, normal thyroid labs at this visit.  He continues on methimazole.  Ok to proceed with surgery.   Pt can proceed with planned procedure barring acute status change.  VS: BP (!) 131/59   Pulse (!) 54   Temp 37 C (Oral)   Resp 16   Ht 6\' 1"  (1.854 m)   Wt 85 kg   SpO2 96%   BMI 24.74 kg/m   PROVIDERS: Binnie Rail, MD is PCP   [name], MD is Cardiologist   Christinia Gully, MD is Pulmonologist  LABS: Labs reviewed: Acceptable for surgery. (all labs ordered are listed, but only abnormal results are displayed)  Labs Reviewed  CBC WITH DIFFERENTIAL/PLATELET - Abnormal;  Notable for the following components:      Result Value   Hemoglobin 12.4 (*)    RDW 16.5 (*)    All other components within normal limits  COMPREHENSIVE METABOLIC PANEL - Abnormal; Notable for the following components:   Albumin 3.2 (*)    All other components within normal limits  HEMOGLOBIN A1C - Abnormal; Notable for the following components:   Hgb A1c MFr Bld 6.3 (*)    All other components within normal limits  APTT  PROTIME-INR  TYPE AND SCREEN  ABO/RH     IMAGES: 11/18/18 Chest Xray  FINDINGS: Chronic pleuroparenchymal changes in the right lung base are stable due to scarring and chronic right effusion. Negative for heart failure. Left lung clear. Cardiac loop recorder unchanged. Right apical pleural thickening unchanged.  IMPRESSION: Chronic right lower lobe scarring and chronic right pleural effusion stable. No superimposed acute abnormality.  Carotid Doppler 10/09/17 Patent right carotid endarterectomy site with no evidence of hyperplasia or restenosis  Left internal carotid artery velocities suggest a 1-39% stenosis  EKG: 11/18/18 Rate 66 bpm NSR with PACs Nonspecific T wave changes   CV: Echo 02/15/19  Study Conclusions  - Left ventricle: The cavity size was normal. Wall thickness was   increased in a pattern of mild LVH. Systolic function was normal.   The estimated ejection fraction was in the range of 55% to 60%.   Wall motion  was normal; there were no regional wall motion   abnormalities. No evidence of thrombus. - Aortic valve: There was no stenosis. - Aorta: Normal caliber thoracic aorta with minimal plaque. - Mitral valve: There was mild regurgitation. - Left atrium: The atrium was mildly dilated. No evidence of   thrombus in the atrial cavity or appendage. No evidence of   thrombus in the atrial cavity or appendage. - Right ventricle: The cavity size was normal. Systolic function   was normal. - Right atrium: No evidence of thrombus in the  atrial cavity or   appendage. - Atrial septum: No evidence for PFO or ASD by color doppler.  Impressions:  - Successful cardioversion. No cardiac source of emboli was   indentified.  Cardiac Cath 08/10/17 No obstructive CAD.  Mild luminal irregularities only.   Stress Test 06/11/17  The left ventricular ejection fraction is hyperdynamic (>65%).  Nuclear stress EF: 69%.  There was no ST segment deviation noted during stress.  Defect 1: There is a large defect of severe severity present in the basal inferior, mid inferior and mid inferolateral location.  This is a low risk study.   Low risk stress nuclear study with prior inferior/inferolateral infarct vs prominent diaphragmatic attenuation; no ischemia; EF 69 with normal wall motion.  Past Medical History:  Diagnosis Date  . AAA (abdominal aortic aneurysm) ([place]) LAST ABDOMINAL US 10-20-17 3.3 CM   MONITORED BY DR [name]  . Anxiety   . Arthritis   . Atrial fibrillation ([place])   . Basal cell carcinoma   . Bilateral carotid artery stenosis DUPLEX 12-29-2012  BY DR [facility]   BILATERAL ICA STENOSIS 60-79%  . BPH (benign prostatic hypertrophy)   . Chronic diastolic heart failure ([place])   . Chronic kidney disease    stage 3, pt unaware  . Colon cancer ([name])   . Complication of anesthesia    had nausea after carotid artery surgery, states the medicine given to help the nausea, made it worse  . COPD (chronic obstructive pulmonary disease) ([place])   . Depression   . GERD (gastroesophageal reflux disease)   . Glaucoma BOTH EYES   Dr Gershon Crane  . History of basal cell carcinoma excision   . History of lung cancer APRIL 2012  SQUAMOUS CELL---- S/P RIGHT LOWER LOBECTOMY AT [facility] --  NO CHEMORADIATION---  NO RECURRENCE    ONCOLOGIST- DR Tressie Stalker  LOV IN [facility] 10-27-2012  . History of pneumothorax    pt unaware  . Hx of adenomatous colonic polyps 2005    X 2; 1 hyperplastic polyp; Dr Olevia Perches  . Hyperlipidemia   . Hypertension   .  Impaired fasting glucose 2007   108; A1c5.4%  . Lesion of bladder   . Lung cancer ([place]) 01/25/2012  . Microhematuria   . OSA (obstructive sleep apnea) 08/29/2015   CPAP SET ON 10  . PAD (peripheral artery disease) ([place]) ABI'S  JAN 2014  0.65 ON RIGHT ;  1.04 ON LEFT  . Peripheral vascular disease ([place]) S/P ANGIOPLASTY AND STENTING   FOLLOWED  BY DR [name]  . Pleural effusion on right 11/18/2018   Chronic, noted on CXR  . Spinal stenosis Sept. 2015  . Status post placement of implantable loop recorder   . Stroke [facility]) Jul 07, 2013   mini  TIA  . Thoracic aorta atherosclerosis ([place])   . Thyrotoxicosis    amiodarone induced  . Urgency of urination     Past Surgical History:  Procedure Laterality Date  .  ANGIO PLASTY     X 4 in legs  . AORTOGRAM  07-27-2002   MILD DIFFUSE ILIAC ARTERY OCCLUSIVE DISEASE /  LEFT RENAL ARTERY 20%/ PATENT LEFT FEM-POP GRAFT/ MILD SFA AND POPLITEAL ARTERY OCCLUSIVE DISEASE W/ SEVERE KIDNEY OCCLUSIVE DISEASE  . BASAL CELL CARCINOMA EXCISION     MULTIPLE TIMES--  RIGHT FOREARM, CHEEKS, AND BACK  . BIOPSY  06/25/2018   Procedure: BIOPSY;  Surgeon: Rush Landmark Telford Nab., MD;  Location: [place];  Service: Gastroenterology;;  . CARDIOVASCULAR STRESS TEST  12-08-2012  DR MCLEAN   NORMAL LEXISCAN WITH NO EXERCISE NUCLEAR STUDY/ EF 66%/   NO ISCHEMIA/ NO SIGNIFICANT CHANGE FROM PRIOR STUDY  . CARDIOVERSION N/A 02/14/2018   Procedure: CARDIOVERSION;  Surgeon: Larey Dresser, MD;  Location: [facility] ENDOSCOPY;  Service: Cardiovascular;  Laterality: N/A;  . CAROTID ANGIOGRAM N/A 07/10/2013   Procedure: CAROTID ANGIOGRAM;  Surgeon: Elam Dutch, MD;  Location: [facility] CATH LAB;  Service: Cardiovascular;  Laterality: N/A;  . CAROTID ENDARTERECTOMY Right 07-14-13   cea  . CATARACT EXTRACTION W/ INTRAOCULAR LENS  IMPLANT, BILATERAL    . colon polyectomy    . COLONOSCOPY WITH PROPOFOL N/A 06/25/2018   Procedure: COLONOSCOPY WITH PROPOFOL;  Surgeon: Rush Landmark  Telford Nab., MD;  Location: [place];  Service: Gastroenterology;  Laterality: N/A;  . CYSTOSCOPY W/ RETROGRADES Bilateral 01/21/2013   Procedure: CYSTOSCOPY WITH RETROGRADE PYELOGRAM;  Surgeon: Molli Hazard, MD;  Location: [facility];  Service: Urology;  Laterality: Bilateral;   CYSTO, BLADDER BIOPSY, BILATERAL RETROGRADE PYELOGRAM  RAD TECH FROM RADIOLOGY PER JOY  . CYSTOSCOPY WITH BIOPSY N/A 01/21/2013   Procedure: CYSTOSCOPY WITH BIOPSY;  Surgeon: Molli Hazard, MD;  Location: [facility];  Service: Urology;  Laterality: N/A;  . ENDARTERECTOMY Right 07/14/2013   Procedure: ENDARTERECTOMY CAROTID;  Surgeon: Angelia Mould, MD;  Location: [place];  Service: Vascular;  Laterality: Right;  . EP IMPLANTABLE DEVICE N/A 08/12/2015   Procedure: Loop Recorder Insertion;  Surgeon: Thompson Grayer, MD;  Location: [place] CV LAB;  Service: Cardiovascular;  Laterality: N/A;  . ESOPHAGOGASTRODUODENOSCOPY (EGD) WITH PROPOFOL N/A 06/25/2018   Procedure: ESOPHAGOGASTRODUODENOSCOPY (EGD) WITH PROPOFOL;  Surgeon: Rush Landmark Telford Nab., MD;  Location: [place];  Service: Gastroenterology;  Laterality: N/A;  . ESOPHAGOGASTRODUODENOSCOPY (EGD) WITH PROPOFOL N/A 07/10/2018   Procedure: ESOPHAGOGASTRODUODENOSCOPY (EGD) WITH PROPOFOL;  Surgeon: Milus Banister, MD;  Location: [facility];  Service: Endoscopy;  Laterality: N/A;  . EUS N/A 07/10/2018   Procedure: UPPER ENDOSCOPIC ULTRASOUND (EUS) RADIAL;  Surgeon: Milus Banister, MD;  Location: [facility];  Service: Endoscopy;  Laterality: N/A;  . EYE SURGERY Right   . FEMORAL-POPLITEAL BYPASS GRAFT Left [facility]   AND 2001  IN [place]  . FEMORAL-POPLITEAL BYPASS GRAFT Left 08/30/2015   Procedure: REVISION OF BYPASS GRAFT Left  FEMORAL-POPLITEAL ARTERY;  Surgeon: Angelia Mould, MD;  Location: [place];  Service: Vascular;  Laterality: Left;  . FINE NEEDLE ASPIRATION N/A 07/10/2018    Procedure: FINE NEEDLE ASPIRATION (FNA) LINEAR;  Surgeon: Milus Banister, MD;  [facility];  Service: Endoscopy;  Laterality: N/A;  . LARYNGOSCOPY  06-27-2004   BX VOCAL CORD  (LEUKOPLAKIA)  PER PT NO ISSUES SINCE  . LEFT HEART CATH AND CORONARY ANGIOGRAPHY N/A 08/20/2017   Procedure: LEFT HEART CATH AND CORONARY ANGIOGRAPHY;  Surgeon: Larey Dresser, MD;  Location: [place] CV LAB;  Service: Cardiovascular;  Laterality: N/A;  . LOWER EXTREMITY ANGIOGRAM  Bilateral 08/29/2015   Procedure: Lower Extremity Angiogram;  Surgeon: Angelia Mould, MD;  Location: [place] CV LAB;  Service: Cardiovascular;  Laterality: Bilateral;  . LUNG LOBECTOMY  01/24/2011    RIGHT UPPER LOBE  (SQUAMOUS CELL CARCINOMA) Dr Dorthea Cove , [facility]. No chemotherapyor radiation  . PATCH ANGIOPLASTY Right 07/14/2013   Procedure: PATCH ANGIOPLASTY;  Surgeon: Angelia Mould, MD;  Location: [place];  Service: Vascular;  Laterality: Right;  . PATCH ANGIOPLASTY Left 08/30/2015   Procedure: VEIN PATCH ANGIOPLASTY OF PROXIMAL Left BYPASS GRAFT;  Surgeon: Angelia Mould, MD;  Location: [place];  Service: Vascular;  Laterality: Left;  . PERIPHERAL VASCULAR CATHETERIZATION N/A 08/29/2015   Procedure: Abdominal Aortogram;  Surgeon: Angelia Mould, MD;  Location: [place] CV LAB;  Service: Cardiovascular;  Laterality: N/A;  . POLYPECTOMY  06/25/2018   Procedure: POLYPECTOMY;  Surgeon: Rush Landmark Telford Nab., MD;  Location: [place];  Service: Gastroenterology;;  . Lia Foyer INJECTION  06/25/2018   Procedure: SUBMUCOSAL INJECTION;  Surgeon: Irving Copas., MD;  Location: [place];  Service: Gastroenterology;;  . TEE WITHOUT CARDIOVERSION N/A 02/14/2018   Procedure: TRANSESOPHAGEAL ECHOCARDIOGRAM (TEE);  Surgeon: Larey Dresser, MD;  Location: [facility] ENDOSCOPY;  Service: Cardiovascular;  Laterality: N/A;  . trabecular surgery     OS  . TRANSTHORACIC ECHOCARDIOGRAM  12-29-2012  DR  [facility]   MILD LVH/  LVSF NORMAL/ EF 16-10%/  GRADE I DIASTOLIC DYSFUNCTION    MEDICATIONS: . acetaminophen (TYLENOL) 325 MG tablet  . albuterol (PROVENTIL HFA;VENTOLIN HFA) 108 (90 Base) MCG/ACT inhaler  . amLODipine (NORVASC) 5 MG tablet  . bimatoprost (LUMIGAN) 0.01 % SOLN  . budesonide-formoterol (SYMBICORT) 160-4.5 MCG/ACT inhaler  . docusate sodium (COLACE) 100 MG capsule  . dorzolamide-timolol (COSOPT) 22.3-6.8 MG/ML ophthalmic solution  . doxycycline (VIBRAMYCIN) 100 MG capsule  . ferrous sulfate 325 (65 FE) MG tablet  . FLUoxetine (PROZAC) 40 MG capsule  . furosemide (LASIX) 20 MG tablet  . Magnesium 500 MG CAPS  . Melatonin 5 MG TABS  . methimazole (TAPAZOLE) 10 MG tablet  . metoprolol succinate (TOPROL-XL) 25 MG 24 hr tablet  . montelukast (SINGULAIR) 10 MG tablet  . omeprazole (PRILOSEC) 20 MG capsule  . Polyethyl Glycol-Propyl Glycol (SYSTANE) 0.4-0.3 % SOLN  . potassium chloride SA (K-DUR,KLOR-CON) 20 MEQ tablet  . rosuvastatin (CRESTOR) 40 MG tablet  . senna (SENOKOT) 8.6 MG tablet   No current facility-administered medications for this encounter.      Maia Plan [facility] Pre-Surgical Testing [phone] 12/07/18 12:49 PM

## 2018-12-08 NOTE — Progress Notes (Signed)
Carelink Summary Report / Loop Recorder 

## 2018-12-11 MED ORDER — GENTAMICIN SULFATE 40 MG/ML IJ SOLN
5.0000 mg/kg | INTRAVENOUS | Status: AC
  Administered 2018-12-12: 429.2 mg via INTRAVENOUS
  Filled 2018-12-11: qty 10.75

## 2018-12-11 MED ORDER — BUPIVACAINE LIPOSOME 1.3 % IJ SUSP
20.0000 mL | Freq: Once | INTRAMUSCULAR | Status: DC
  Filled 2018-12-11: qty 20

## 2018-12-11 MED ORDER — CLINDAMYCIN PHOSPHATE 900 MG/50ML IV SOLN
900.0000 mg | INTRAVENOUS | Status: AC
  Administered 2018-12-12: 900 mg via INTRAVENOUS
  Filled 2018-12-11: qty 50

## 2018-12-12 ENCOUNTER — Inpatient Hospital Stay (HOSPITAL_COMMUNITY)
Admission: RE | Admit: 2018-12-12 | Discharge: 2018-12-19 | DRG: 330 | Disposition: A | Discharge status: Home Health | Payer: Commercial Managed Care - PPO | Attending: Surgery | Admitting: Surgery

## 2018-12-12 ENCOUNTER — Other Ambulatory Visit: Payer: Self-pay

## 2018-12-12 ENCOUNTER — Encounter (HOSPITAL_COMMUNITY): Payer: Self-pay | Admitting: *Deleted

## 2018-12-12 ENCOUNTER — Inpatient Hospital Stay (HOSPITAL_COMMUNITY): Payer: Commercial Managed Care - PPO | Admitting: Certified Registered"

## 2018-12-12 ENCOUNTER — Inpatient Hospital Stay (HOSPITAL_COMMUNITY): Payer: Commercial Managed Care - PPO | Admitting: Physician Assistant

## 2018-12-12 ENCOUNTER — Encounter (HOSPITAL_COMMUNITY)
Admission: RE | Disposition: A | Discharge status: Home Health | Payer: Self-pay | Source: Home / Self Care | Attending: Surgery

## 2018-12-12 DIAGNOSIS — G4733 Obstructive sleep apnea (adult) (pediatric): Secondary | ICD-10-CM | POA: Diagnosis present

## 2018-12-12 DIAGNOSIS — F329 Major depressive disorder, single episode, unspecified: Secondary | ICD-10-CM | POA: Diagnosis present

## 2018-12-12 DIAGNOSIS — I130 Hypertensive heart and chronic kidney disease with heart failure and stage 1 through stage 4 chronic kidney disease, or unspecified chronic kidney disease: Secondary | ICD-10-CM | POA: Diagnosis present

## 2018-12-12 DIAGNOSIS — F419 Anxiety disorder, unspecified: Secondary | ICD-10-CM | POA: Diagnosis present

## 2018-12-12 DIAGNOSIS — Z85828 Personal history of other malignant neoplasm of skin: Secondary | ICD-10-CM | POA: Diagnosis not present

## 2018-12-12 DIAGNOSIS — I5032 Chronic diastolic (congestive) heart failure: Secondary | ICD-10-CM | POA: Diagnosis present

## 2018-12-12 DIAGNOSIS — Z823 Family history of stroke: Secondary | ICD-10-CM | POA: Diagnosis not present

## 2018-12-12 DIAGNOSIS — J449 Chronic obstructive pulmonary disease, unspecified: Secondary | ICD-10-CM | POA: Diagnosis present

## 2018-12-12 DIAGNOSIS — K219 Gastro-esophageal reflux disease without esophagitis: Secondary | ICD-10-CM | POA: Diagnosis present

## 2018-12-12 DIAGNOSIS — I4891 Unspecified atrial fibrillation: Secondary | ICD-10-CM | POA: Diagnosis present

## 2018-12-12 DIAGNOSIS — E785 Hyperlipidemia, unspecified: Secondary | ICD-10-CM | POA: Diagnosis present

## 2018-12-12 DIAGNOSIS — N400 Enlarged prostate without lower urinary tract symptoms: Secondary | ICD-10-CM | POA: Diagnosis present

## 2018-12-12 DIAGNOSIS — I2510 Atherosclerotic heart disease of native coronary artery without angina pectoris: Secondary | ICD-10-CM | POA: Diagnosis present

## 2018-12-12 DIAGNOSIS — Z8673 Personal history of transient ischemic attack (TIA), and cerebral infarction without residual deficits: Secondary | ICD-10-CM | POA: Diagnosis not present

## 2018-12-12 DIAGNOSIS — Z902 Acquired absence of lung [part of]: Secondary | ICD-10-CM | POA: Diagnosis not present

## 2018-12-12 DIAGNOSIS — C187 Malignant neoplasm of sigmoid colon: Secondary | ICD-10-CM | POA: Diagnosis present

## 2018-12-12 DIAGNOSIS — N183 Chronic kidney disease, stage 3 (moderate): Secondary | ICD-10-CM | POA: Diagnosis present

## 2018-12-12 DIAGNOSIS — Z8249 Family history of ischemic heart disease and other diseases of the circulatory system: Secondary | ICD-10-CM

## 2018-12-12 DIAGNOSIS — Z803 Family history of malignant neoplasm of breast: Secondary | ICD-10-CM | POA: Diagnosis not present

## 2018-12-12 DIAGNOSIS — Z85118 Personal history of other malignant neoplasm of bronchus and lung: Secondary | ICD-10-CM

## 2018-12-12 DIAGNOSIS — Z87891 Personal history of nicotine dependence: Secondary | ICD-10-CM

## 2018-12-12 HISTORY — PX: FLEXIBLE SIGMOIDOSCOPY: SHX5431

## 2018-12-12 LAB — TYPE AND SCREEN
ABO/RH(D): A POS
Antibody Screen: NEGATIVE

## 2018-12-12 SURGERY — COLECTOMY, PARTIAL, ROBOT-ASSISTED, LAPAROSCOPIC
Anesthesia: General | Site: Rectum

## 2018-12-12 MED ORDER — ALVIMOPAN 12 MG PO CAPS
ORAL_CAPSULE | ORAL | Status: AC
  Administered 2018-12-12: 12 mg via ORAL
  Filled 2018-12-12: qty 1

## 2018-12-12 MED ORDER — MONTELUKAST SODIUM 10 MG PO TABS
10.0000 mg | ORAL_TABLET | Freq: Every day | ORAL | Status: DC
  Administered 2018-12-12 – 2018-12-18 (×7): 10 mg via ORAL
  Filled 2018-12-12 (×7): qty 1

## 2018-12-12 MED ORDER — PHENYLEPHRINE 40 MCG/ML (10ML) SYRINGE FOR IV PUSH (FOR BLOOD PRESSURE SUPPORT)
PREFILLED_SYRINGE | INTRAVENOUS | Status: AC
  Filled 2018-12-12: qty 10

## 2018-12-12 MED ORDER — ENSURE SURGERY PO LIQD
237.0000 mL | Freq: Two times a day (BID) | ORAL | Status: DC
  Administered 2018-12-15 – 2018-12-19 (×3): 237 mL via ORAL
  Filled 2018-12-12 (×15): qty 237

## 2018-12-12 MED ORDER — ALBUTEROL SULFATE (2.5 MG/3ML) 0.083% IN NEBU
3.0000 mL | INHALATION_SOLUTION | Freq: Four times a day (QID) | RESPIRATORY_TRACT | Status: DC
  Filled 2018-12-12: qty 3

## 2018-12-12 MED ORDER — ONDANSETRON HCL 4 MG PO TABS: 4.0000 mg | ORAL_TABLET | Freq: Four times a day (QID) | ORAL | Status: DC | PRN

## 2018-12-12 MED ORDER — BUDESONIDE-FORMOTEROL FUMARATE 160-4.5 MCG/ACT IN AERO: 2.0000 | INHALATION_SPRAY | Freq: Two times a day (BID) | RESPIRATORY_TRACT | Status: DC

## 2018-12-12 MED ORDER — OXYCODONE HCL 5 MG/5ML PO SOLN: 5.0000 mg | Freq: Once | ORAL | Status: DC | PRN

## 2018-12-12 MED ORDER — BIMATOPROST 0.01 % OP SOLN
1.0000 [drp] | Freq: Every day | OPHTHALMIC | Status: DC
  Administered 2018-12-12 – 2018-12-18 (×7): 1 [drp] via OPHTHALMIC

## 2018-12-12 MED ORDER — KETAMINE HCL 10 MG/ML IJ SOLN
INTRAMUSCULAR | Status: DC | PRN
  Administered 2018-12-12 (×2): 20 mg via INTRAVENOUS

## 2018-12-12 MED ORDER — FUROSEMIDE 40 MG PO TABS
40.0000 mg | ORAL_TABLET | Freq: Every day | ORAL | Status: DC
  Administered 2018-12-13 – 2018-12-19 (×7): 40 mg via ORAL
  Filled 2018-12-12 (×7): qty 1

## 2018-12-12 MED ORDER — DIPHENHYDRAMINE HCL 12.5 MG/5ML PO ELIX: 12.5000 mg | ORAL_SOLUTION | Freq: Four times a day (QID) | ORAL | Status: DC | PRN

## 2018-12-12 MED ORDER — ACETAMINOPHEN 500 MG PO TABS
1000.0000 mg | ORAL_TABLET | ORAL | Status: AC
  Administered 2018-12-12: 1000 mg via ORAL

## 2018-12-12 MED ORDER — DIPHENHYDRAMINE HCL 50 MG/ML IJ SOLN: 12.5000 mg | Freq: Four times a day (QID) | INTRAMUSCULAR | Status: DC | PRN

## 2018-12-12 MED ORDER — BUPIVACAINE LIPOSOME 1.3 % IJ SUSP
INTRAMUSCULAR | Status: DC | PRN
  Administered 2018-12-12: 20 mL

## 2018-12-12 MED ORDER — STERILE WATER FOR IRRIGATION IR SOLN
Status: DC | PRN
  Administered 2018-12-12: 1000 mL

## 2018-12-12 MED ORDER — DORZOLAMIDE HCL-TIMOLOL MAL 2-0.5 % OP SOLN
2.0000 [drp] | Freq: Two times a day (BID) | OPHTHALMIC | Status: DC
  Administered 2018-12-12 – 2018-12-18 (×9): 2 [drp] via OPHTHALMIC
  Filled 2018-12-12 (×2): qty 10

## 2018-12-12 MED ORDER — ALVIMOPAN 12 MG PO CAPS
12.0000 mg | ORAL_CAPSULE | ORAL | Status: AC
  Administered 2018-12-12: 12 mg via ORAL

## 2018-12-12 MED ORDER — NEOMYCIN SULFATE 500 MG PO TABS: 1000.0000 mg | ORAL_TABLET | ORAL | Status: DC

## 2018-12-12 MED ORDER — IBUPROFEN 200 MG PO TABS: 600.0000 mg | ORAL_TABLET | Freq: Four times a day (QID) | ORAL | Status: DC | PRN

## 2018-12-12 MED ORDER — METOPROLOL SUCCINATE ER 25 MG PO TB24
25.0000 mg | ORAL_TABLET | Freq: Every day | ORAL | Status: DC
  Administered 2018-12-13 – 2018-12-19 (×6): 25 mg via ORAL
  Filled 2018-12-12 (×7): qty 1

## 2018-12-12 MED ORDER — LIDOCAINE HCL 2 % IJ SOLN
INTRAMUSCULAR | Status: AC
  Filled 2018-12-12: qty 20

## 2018-12-12 MED ORDER — POTASSIUM CHLORIDE CRYS ER 20 MEQ PO TBCR
20.0000 meq | EXTENDED_RELEASE_TABLET | Freq: Every day | ORAL | Status: DC
  Administered 2018-12-13 – 2018-12-19 (×7): 20 meq via ORAL
  Filled 2018-12-12 (×7): qty 1

## 2018-12-12 MED ORDER — BUPIVACAINE-EPINEPHRINE (PF) 0.25% -1:200000 IJ SOLN
INTRAMUSCULAR | Status: DC | PRN
  Administered 2018-12-12: 30 mL via PERINEURAL

## 2018-12-12 MED ORDER — GABAPENTIN 300 MG PO CAPS
ORAL_CAPSULE | ORAL | Status: AC
  Administered 2018-12-12: 300 mg via ORAL
  Filled 2018-12-12: qty 1

## 2018-12-12 MED ORDER — DEXAMETHASONE SODIUM PHOSPHATE 10 MG/ML IJ SOLN
INTRAMUSCULAR | Status: AC
  Filled 2018-12-12: qty 1

## 2018-12-12 MED ORDER — POLYETHYLENE GLYCOL 3350 17 GM/SCOOP PO POWD: 1.0000 | Freq: Once | ORAL | Status: DC

## 2018-12-12 MED ORDER — MAGNESIUM OXIDE 400 (241.3 MG) MG PO TABS
400.0000 mg | ORAL_TABLET | Freq: Every day | ORAL | Status: DC
  Administered 2018-12-12 – 2018-12-19 (×8): 400 mg via ORAL
  Filled 2018-12-12 (×8): qty 1

## 2018-12-12 MED ORDER — GABAPENTIN 300 MG PO CAPS
300.0000 mg | ORAL_CAPSULE | ORAL | Status: AC
  Administered 2018-12-12: 300 mg via ORAL

## 2018-12-12 MED ORDER — HEPARIN SODIUM (PORCINE) 5000 UNIT/ML IJ SOLN
5000.0000 [IU] | Freq: Once | INTRAMUSCULAR | Status: AC
  Administered 2018-12-12: 5000 [IU] via SUBCUTANEOUS

## 2018-12-12 MED ORDER — ROCURONIUM BROMIDE 10 MG/ML (PF) SYRINGE
PREFILLED_SYRINGE | INTRAVENOUS | Status: AC
  Filled 2018-12-12: qty 10

## 2018-12-12 MED ORDER — LIDOCAINE 2% (20 MG/ML) 5 ML SYRINGE
INTRAMUSCULAR | Status: DC | PRN
  Administered 2018-12-12: 1.5 mg/kg/h via INTRAVENOUS

## 2018-12-12 MED ORDER — ALBUTEROL SULFATE (2.5 MG/3ML) 0.083% IN NEBU: 3.0000 mL | INHALATION_SOLUTION | Freq: Four times a day (QID) | RESPIRATORY_TRACT | Status: DC | PRN

## 2018-12-12 MED ORDER — PROPOFOL 10 MG/ML IV BOLUS
INTRAVENOUS | Status: DC | PRN
  Administered 2018-12-12: 130 mg via INTRAVENOUS

## 2018-12-12 MED ORDER — MAGNESIUM 500 MG PO CAPS: 500.0000 mg | ORAL_CAPSULE | Freq: Every day | ORAL | Status: DC

## 2018-12-12 MED ORDER — LATANOPROST 0.005 % OP SOLN
1.0000 [drp] | Freq: Every day | OPHTHALMIC | Status: DC
  Filled 2018-12-12: qty 2.5

## 2018-12-12 MED ORDER — SUGAMMADEX SODIUM 200 MG/2ML IV SOLN
INTRAVENOUS | Status: AC
  Filled 2018-12-12: qty 2

## 2018-12-12 MED ORDER — ALVIMOPAN 12 MG PO CAPS
12.0000 mg | ORAL_CAPSULE | Freq: Two times a day (BID) | ORAL | Status: DC
  Administered 2018-12-13 – 2018-12-15 (×5): 12 mg via ORAL
  Filled 2018-12-12 (×6): qty 1

## 2018-12-12 MED ORDER — SUGAMMADEX SODIUM 200 MG/2ML IV SOLN
INTRAVENOUS | Status: DC | PRN
  Administered 2018-12-12: 200 mg via INTRAVENOUS

## 2018-12-12 MED ORDER — ACETAMINOPHEN 500 MG PO TABS
ORAL_TABLET | ORAL | Status: AC
  Administered 2018-12-12: 1000 mg via ORAL
  Filled 2018-12-12: qty 2

## 2018-12-12 MED ORDER — POLYVINYL ALCOHOL 1.4 % OP SOLN
1.0000 [drp] | OPHTHALMIC | Status: DC | PRN
  Filled 2018-12-12: qty 15

## 2018-12-12 MED ORDER — KETAMINE HCL 10 MG/ML IJ SOLN
INTRAMUSCULAR | Status: AC
  Filled 2018-12-12: qty 1

## 2018-12-12 MED ORDER — METHIMAZOLE 10 MG PO TABS
10.0000 mg | ORAL_TABLET | Freq: Every day | ORAL | Status: DC
  Administered 2018-12-13 – 2018-12-19 (×7): 10 mg via ORAL
  Filled 2018-12-12 (×7): qty 1

## 2018-12-12 MED ORDER — ACETAMINOPHEN 500 MG PO TABS
1000.0000 mg | ORAL_TABLET | Freq: Four times a day (QID) | ORAL | Status: DC
  Administered 2018-12-12 – 2018-12-19 (×24): 1000 mg via ORAL
  Filled 2018-12-12 (×24): qty 2

## 2018-12-12 MED ORDER — 0.9 % SODIUM CHLORIDE (POUR BTL) OPTIME
TOPICAL | Status: DC | PRN
  Administered 2018-12-12: 2000 mL

## 2018-12-12 MED ORDER — FENTANYL CITRATE (PF) 250 MCG/5ML IJ SOLN
INTRAMUSCULAR | Status: DC | PRN
  Administered 2018-12-12: 50 ug via INTRAVENOUS

## 2018-12-12 MED ORDER — ONDANSETRON HCL 4 MG/2ML IJ SOLN: 4.0000 mg | Freq: Four times a day (QID) | INTRAMUSCULAR | Status: DC | PRN

## 2018-12-12 MED ORDER — LIDOCAINE 2% (20 MG/ML) 5 ML SYRINGE
INTRAMUSCULAR | Status: DC | PRN
  Administered 2018-12-12: 80 mg via INTRAVENOUS

## 2018-12-12 MED ORDER — POLYETHYL GLYCOL-PROPYL GLYCOL 0.4-0.3 % OP SOLN: 1.0000 [drp] | Freq: Every day | OPHTHALMIC | Status: DC | PRN

## 2018-12-12 MED ORDER — LACTATED RINGERS IV SOLN
INTRAVENOUS | Status: DC
  Administered 2018-12-12 (×2): via INTRAVENOUS

## 2018-12-12 MED ORDER — FERROUS SULFATE 325 (65 FE) MG PO TABS
325.0000 mg | ORAL_TABLET | Freq: Every day | ORAL | Status: DC
  Administered 2018-12-12 – 2018-12-18 (×7): 325 mg via ORAL
  Filled 2018-12-12 (×7): qty 1

## 2018-12-12 MED ORDER — HYDROMORPHONE HCL 1 MG/ML IJ SOLN: 0.5000 mg | INTRAMUSCULAR | Status: DC | PRN

## 2018-12-12 MED ORDER — FLUOXETINE HCL 20 MG PO CAPS
40.0000 mg | ORAL_CAPSULE | Freq: Every day | ORAL | Status: DC
  Administered 2018-12-13 – 2018-12-19 (×7): 40 mg via ORAL
  Filled 2018-12-12 (×7): qty 2

## 2018-12-12 MED ORDER — ROSUVASTATIN CALCIUM 20 MG PO TABS
40.0000 mg | ORAL_TABLET | Freq: Every day | ORAL | Status: DC
  Administered 2018-12-13 – 2018-12-19 (×7): 40 mg via ORAL
  Filled 2018-12-12 (×7): qty 2

## 2018-12-12 MED ORDER — ONDANSETRON HCL 4 MG/2ML IJ SOLN
INTRAMUSCULAR | Status: AC
  Filled 2018-12-12: qty 2

## 2018-12-12 MED ORDER — ALUM & MAG HYDROXIDE-SIMETH 200-200-20 MG/5ML PO SUSP: 30.0000 mL | Freq: Four times a day (QID) | ORAL | Status: DC | PRN

## 2018-12-12 MED ORDER — AMLODIPINE BESYLATE 5 MG PO TABS
5.0000 mg | ORAL_TABLET | Freq: Every day | ORAL | Status: DC
  Administered 2018-12-13 – 2018-12-19 (×7): 5 mg via ORAL
  Filled 2018-12-12 (×7): qty 1

## 2018-12-12 MED ORDER — ROCURONIUM BROMIDE 10 MG/ML (PF) SYRINGE
PREFILLED_SYRINGE | INTRAVENOUS | Status: DC | PRN
  Administered 2018-12-12: 15 mg via INTRAVENOUS
  Administered 2018-12-12: 55 mg via INTRAVENOUS
  Administered 2018-12-12 (×2): 10 mg via INTRAVENOUS

## 2018-12-12 MED ORDER — HEPARIN SODIUM (PORCINE) 5000 UNIT/ML IJ SOLN
5000.0000 [IU] | Freq: Three times a day (TID) | INTRAMUSCULAR | Status: DC
  Administered 2018-12-13 – 2018-12-19 (×19): 5000 [IU] via SUBCUTANEOUS
  Filled 2018-12-12 (×19): qty 1

## 2018-12-12 MED ORDER — METHIMAZOLE 5 MG PO TABS
5.0000 mg | ORAL_TABLET | Freq: Every evening | ORAL | Status: DC
  Administered 2018-12-12 – 2018-12-18 (×6): 5 mg via ORAL
  Filled 2018-12-12 (×8): qty 1

## 2018-12-12 MED ORDER — EPHEDRINE 5 MG/ML INJ
INTRAVENOUS | Status: AC
  Filled 2018-12-12: qty 10

## 2018-12-12 MED ORDER — BUPIVACAINE-EPINEPHRINE (PF) 0.25% -1:200000 IJ SOLN
INTRAMUSCULAR | Status: AC
  Filled 2018-12-12: qty 30

## 2018-12-12 MED ORDER — MIDAZOLAM HCL 2 MG/2ML IJ SOLN
INTRAMUSCULAR | Status: DC | PRN
  Administered 2018-12-12: 1 mg via INTRAVENOUS

## 2018-12-12 MED ORDER — ONDANSETRON HCL 4 MG/2ML IJ SOLN
INTRAMUSCULAR | Status: DC | PRN
  Administered 2018-12-12 (×2): 4 mg via INTRAVENOUS

## 2018-12-12 MED ORDER — PROPOFOL 10 MG/ML IV BOLUS
INTRAVENOUS | Status: AC
  Filled 2018-12-12: qty 20

## 2018-12-12 MED ORDER — SODIUM CHLORIDE (PF) 0.9 % IJ SOLN
INTRAMUSCULAR | Status: AC
  Filled 2018-12-12: qty 10

## 2018-12-12 MED ORDER — FENTANYL CITRATE (PF) 100 MCG/2ML IJ SOLN: 25.0000 ug | INTRAMUSCULAR | Status: DC | PRN

## 2018-12-12 MED ORDER — EPHEDRINE SULFATE 50 MG/ML IJ SOLN
INTRAMUSCULAR | Status: DC | PRN
  Administered 2018-12-12: 5 mg via INTRAVENOUS
  Administered 2018-12-12: 10 mg via INTRAVENOUS

## 2018-12-12 MED ORDER — HYDRALAZINE HCL 20 MG/ML IJ SOLN: 10.0000 mg | INTRAMUSCULAR | Status: DC | PRN

## 2018-12-12 MED ORDER — MIDAZOLAM HCL 2 MG/2ML IJ SOLN
INTRAMUSCULAR | Status: AC
  Filled 2018-12-12: qty 2

## 2018-12-12 MED ORDER — CHLORHEXIDINE GLUCONATE CLOTH 2 % EX PADS: 6.0000 | MEDICATED_PAD | Freq: Once | CUTANEOUS | Status: DC

## 2018-12-12 MED ORDER — LIDOCAINE 2% (20 MG/ML) 5 ML SYRINGE
INTRAMUSCULAR | Status: AC
  Filled 2018-12-12: qty 5

## 2018-12-12 MED ORDER — PANTOPRAZOLE SODIUM 40 MG PO TBEC
40.0000 mg | DELAYED_RELEASE_TABLET | Freq: Every day | ORAL | Status: DC
  Administered 2018-12-13 – 2018-12-19 (×7): 40 mg via ORAL
  Filled 2018-12-12 (×7): qty 1

## 2018-12-12 MED ORDER — METRONIDAZOLE 500 MG PO TABS: 1000.0000 mg | ORAL_TABLET | ORAL | Status: DC

## 2018-12-12 MED ORDER — POLYETHYL GLYCOL-PROPYL GLYCOL 0.4-0.3 % OP SOLN: 1.0000 [drp] | OPHTHALMIC | Status: DC | PRN

## 2018-12-12 MED ORDER — OXYCODONE HCL 5 MG PO TABS: 5.0000 mg | ORAL_TABLET | Freq: Once | ORAL | Status: DC | PRN

## 2018-12-12 MED ORDER — MOMETASONE FURO-FORMOTEROL FUM 200-5 MCG/ACT IN AERO
2.0000 | INHALATION_SPRAY | Freq: Two times a day (BID) | RESPIRATORY_TRACT | Status: DC
  Filled 2018-12-12: qty 8.8

## 2018-12-12 MED ORDER — HEPARIN SODIUM (PORCINE) 5000 UNIT/ML IJ SOLN
INTRAMUSCULAR | Status: AC
  Administered 2018-12-12: 5000 [IU] via SUBCUTANEOUS
  Filled 2018-12-12: qty 1

## 2018-12-12 MED ORDER — FENTANYL CITRATE (PF) 250 MCG/5ML IJ SOLN
INTRAMUSCULAR | Status: AC
  Filled 2018-12-12: qty 5

## 2018-12-12 MED ORDER — DEXAMETHASONE SODIUM PHOSPHATE 10 MG/ML IJ SOLN
INTRAMUSCULAR | Status: DC | PRN
  Administered 2018-12-12: 8 mg via INTRAVENOUS

## 2018-12-12 MED ORDER — TRAMADOL HCL 50 MG PO TABS: 50.0000 mg | ORAL_TABLET | Freq: Four times a day (QID) | ORAL | Status: DC | PRN

## 2018-12-12 MED ORDER — LACTATED RINGERS IV SOLN
INTRAVENOUS | Status: DC
  Administered 2018-12-13: via INTRAVENOUS

## 2018-12-12 SURGICAL SUPPLY — 110 items
ADH SKN CLS APL DERMABOND .7 (GAUZE/BANDAGES/DRESSINGS) ×2
APPLIER CLIP 5 13 M/L LIGAMAX5 (MISCELLANEOUS)
APPLIER CLIP ROT 10 11.4 M/L (STAPLE)
APR CLP MED LRG 11.4X10 (STAPLE)
APR CLP MED LRG 5 ANG JAW (MISCELLANEOUS)
BLADE EXTENDED COATED 6.5IN (ELECTRODE) ×3 IMPLANT
CANNULA REDUC XI 12-8 STAPL (CANNULA) ×1
CANNULA REDUCER 12-8 DVNC XI (CANNULA) ×2 IMPLANT
CELLS DAT CNTRL 66122 CELL SVR (MISCELLANEOUS) IMPLANT
CHLORAPREP W/TINT 26ML (MISCELLANEOUS) ×3 IMPLANT
CLIP APPLIE 5 13 M/L LIGAMAX5 (MISCELLANEOUS) IMPLANT
CLIP APPLIE ROT 10 11.4 M/L (STAPLE) IMPLANT
CLIP VESOLOCK LG 6/CT PURPLE (CLIP) ×1 IMPLANT
CLIP VESOLOCK MED LG 6/CT (CLIP) IMPLANT
CLIP VESOLOCK XL 6/CT (CLIP) IMPLANT
COVER SURGICAL LIGHT HANDLE (MISCELLANEOUS) ×6 IMPLANT
COVER TIP SHEARS 8 DVNC (MISCELLANEOUS) ×2 IMPLANT
COVER TIP SHEARS 8MM DA VINCI (MISCELLANEOUS) ×1
COVER WAND RF STERILE (DRAPES) ×1 IMPLANT
DECANTER SPIKE VIAL GLASS SM (MISCELLANEOUS) ×3 IMPLANT
DERMABOND ADVANCED (GAUZE/BANDAGES/DRESSINGS) ×1
DERMABOND ADVANCED .7 DNX12 (GAUZE/BANDAGES/DRESSINGS) IMPLANT
DEVICE TROCAR PUNCTURE CLOSURE (ENDOMECHANICALS) IMPLANT
DRAIN CHANNEL 19F RND (DRAIN) ×3 IMPLANT
DRAPE ARM DVNC X/XI (DISPOSABLE) ×8 IMPLANT
DRAPE COLUMN DVNC XI (DISPOSABLE) ×2 IMPLANT
DRAPE DA VINCI XI ARM (DISPOSABLE) ×4
DRAPE DA VINCI XI COLUMN (DISPOSABLE) ×1
DRAPE SURG IRRIG POUCH 19X23 (DRAPES) ×3 IMPLANT
DRSG OPSITE POSTOP 4X10 (GAUZE/BANDAGES/DRESSINGS) IMPLANT
DRSG OPSITE POSTOP 4X6 (GAUZE/BANDAGES/DRESSINGS) ×1 IMPLANT
DRSG OPSITE POSTOP 4X8 (GAUZE/BANDAGES/DRESSINGS) IMPLANT
DRSG TEGADERM 2-3/8X2-3/4 SM (GAUZE/BANDAGES/DRESSINGS) ×15 IMPLANT
DRSG TEGADERM 4X4.75 (GAUZE/BANDAGES/DRESSINGS) ×3 IMPLANT
ELECT PENCIL ROCKER SW 15FT (MISCELLANEOUS) ×3 IMPLANT
ELECT REM PT RETURN 15FT ADLT (MISCELLANEOUS) ×3 IMPLANT
ENDOLOOP SUT PDS II  0 18 (SUTURE)
ENDOLOOP SUT PDS II 0 18 (SUTURE) IMPLANT
EVACUATOR SILICONE 100CC (DRAIN) ×3 IMPLANT
GAUZE SPONGE 2X2 8PLY STRL LF (GAUZE/BANDAGES/DRESSINGS) ×2 IMPLANT
GAUZE SPONGE 4X4 12PLY STRL (GAUZE/BANDAGES/DRESSINGS) IMPLANT
GLOVE BIO SURGEON STRL SZ7.5 (GLOVE) ×9 IMPLANT
GLOVE INDICATOR 8.0 STRL GRN (GLOVE) ×9 IMPLANT
GOWN STRL REUS W/TWL XL LVL3 (GOWN DISPOSABLE) ×15 IMPLANT
GRASPER SUT TROCAR 14GX15 (MISCELLANEOUS) ×3 IMPLANT
HOLDER FOLEY CATH W/STRAP (MISCELLANEOUS) ×3 IMPLANT
IRRIG SUCT STRYKERFLOW 2 WTIP (MISCELLANEOUS)
IRRIGATION SUCT STRKRFLW 2 WTP (MISCELLANEOUS) IMPLANT
KIT PROCEDURE DA VINCI SI (MISCELLANEOUS) ×1
KIT PROCEDURE DVNC SI (MISCELLANEOUS) ×2 IMPLANT
KIT TURNOVER KIT A (KITS) IMPLANT
NDL INSUFFLATION 14GA 120MM (NEEDLE) ×2 IMPLANT
NEEDLE INSUFFLATION 14GA 120MM (NEEDLE) ×3 IMPLANT
PACK CARDIOVASCULAR III (CUSTOM PROCEDURE TRAY) ×3 IMPLANT
PACK COLON (CUSTOM PROCEDURE TRAY) ×3 IMPLANT
PAD POSITIONING PINK XL (MISCELLANEOUS) ×3 IMPLANT
PORT LAP GEL ALEXIS MED 5-9CM (MISCELLANEOUS) ×3 IMPLANT
PROTECTOR NERVE ULNAR (MISCELLANEOUS) ×6 IMPLANT
RELOAD STAPLE 45 3.5 WHT DVNC (STAPLE) IMPLANT
RELOAD STAPLE 45 BLU REG DVNC (STAPLE) IMPLANT
RELOAD STAPLE 45 GRN THCK DVNC (STAPLE) IMPLANT
RETRACTOR WND ALEXIS 18 MED (MISCELLANEOUS) IMPLANT
RTRCTR WOUND ALEXIS 18CM MED (MISCELLANEOUS)
SCISSORS LAP 5X35 DISP (ENDOMECHANICALS) ×3 IMPLANT
SEAL CANN UNIV 5-8 DVNC XI (MISCELLANEOUS) ×8 IMPLANT
SEAL XI 5MM-8MM UNIVERSAL (MISCELLANEOUS) ×4
SEALER VESSEL DA VINCI XI (MISCELLANEOUS) ×1
SEALER VESSEL EXT DVNC XI (MISCELLANEOUS) ×2 IMPLANT
SLEEVE ADV FIXATION 5X100MM (TROCAR) IMPLANT
SOLUTION ELECTROLUBE (MISCELLANEOUS) ×3 IMPLANT
SPONGE GAUZE 2X2 STER 10/PKG (GAUZE/BANDAGES/DRESSINGS) ×1
STAPLER 45 BLU RELOAD XI (STAPLE) IMPLANT
STAPLER 45 BLUE RELOAD XI (STAPLE)
STAPLER 45 GREEN RELOAD XI (STAPLE) ×2
STAPLER 45 GRN RELOAD XI (STAPLE) ×4 IMPLANT
STAPLER 45 WHITE RELOAD XI (STAPLE) ×1
STAPLER 45 WHT RELOAD XI (STAPLE) ×2 IMPLANT
STAPLER CANNULA SEAL DVNC XI (STAPLE) ×2 IMPLANT
STAPLER CANNULA SEAL XI (STAPLE) ×1
STAPLER ECHELON POWER CIR 29 (STAPLE) ×1 IMPLANT
STAPLER SHEATH (SHEATH) ×1
STAPLER SHEATH ENDOWRIST DVNC (SHEATH) ×2 IMPLANT
SURGILUBE 2OZ TUBE FLIPTOP (MISCELLANEOUS) ×3 IMPLANT
SUT MNCRL AB 4-0 PS2 18 (SUTURE) ×6 IMPLANT
SUT PDS AB 1 CTX 36 (SUTURE) ×2 IMPLANT
SUT PDS AB 1 TP1 96 (SUTURE) IMPLANT
SUT PROLENE 0 CT 2 (SUTURE) IMPLANT
SUT PROLENE 2 0 KS (SUTURE) ×1 IMPLANT
SUT PROLENE 2 0 SH DA (SUTURE) IMPLANT
SUT SILK 2 0 (SUTURE)
SUT SILK 2 0 SH CR/8 (SUTURE) IMPLANT
SUT SILK 2-0 18XBRD TIE 12 (SUTURE) IMPLANT
SUT SILK 3 0 (SUTURE) ×3
SUT SILK 3 0 SH CR/8 (SUTURE) ×3 IMPLANT
SUT SILK 3-0 18XBRD TIE 12 (SUTURE) ×2 IMPLANT
SUT V-LOC BARB 180 2/0GR6 GS22 (SUTURE)
SUT VIC AB 3-0 SH 18 (SUTURE) IMPLANT
SUT VIC AB 3-0 SH 27 (SUTURE)
SUT VIC AB 3-0 SH 27XBRD (SUTURE) IMPLANT
SUT VICRYL 0 UR6 27IN ABS (SUTURE) ×3 IMPLANT
SUTURE V-LC BRB 180 2/0GR6GS22 (SUTURE) IMPLANT
SYR 10ML LL (SYRINGE) ×3 IMPLANT
SYS LAPSCP GELPORT 120MM (MISCELLANEOUS)
SYSTEM LAPSCP GELPORT 120MM (MISCELLANEOUS) IMPLANT
TAPE UMBILICAL COTTON 1/8X30 (MISCELLANEOUS) ×2 IMPLANT
TOWEL OR NON WOVEN STRL DISP B (DISPOSABLE) ×3 IMPLANT
TRAY FOLEY MTR SLVR 16FR STAT (SET/KITS/TRAYS/PACK) ×3 IMPLANT
TROCAR ADV FIXATION 5X100MM (TROCAR) ×3 IMPLANT
TUBING CONNECTING 10 (TUBING) ×6 IMPLANT
TUBING INSUFFLATION 10FT LAP (TUBING) ×3 IMPLANT

## 2018-12-12 NOTE — Anesthesia Procedure Notes (Signed)
Procedure Name: Intubation Date/Time: 12/12/2018 7:35 AM Performed by: Niel Hummer, CRNA Pre-anesthesia Checklist: Patient being monitored, Suction available, Emergency Drugs available and Patient identified Patient Re-evaluated:Patient Re-evaluated prior to induction Oxygen Delivery Method: Circle system utilized Preoxygenation: Pre-oxygenation with 100% oxygen Induction Type: IV induction Ventilation: Oral airway inserted - appropriate to patient size and Two handed mask ventilation required Laryngoscope Size: Mac and 4 Grade View: Grade I Tube type: Oral Tube size: 7.5 mm Number of attempts: 1 Airway Equipment and Method: Stylet Placement Confirmation: ETT inserted through vocal cords under direct vision,  positive ETCO2 and breath sounds checked- equal and bilateral Secured at: 22 cm Tube secured with: Tape Dental Injury: Teeth and Oropharynx as per pre-operative assessment

## 2018-12-12 NOTE — H&P (Addendum)
CC: Here today for surgery  HPI: [name] is a very pleasant 89yoF with hx of HTN, Afib (off Eliquis x74mo), ?CHF, possible COPD, HLD referred by Dr. Rush Landmark following a colonoscopy done 06/25/18. He had presented to the hospital with what he thought was a TIA but was subsequently found to have anemia as his underlying issue. He underwent colonoscopy with Dr. Rush Landmark 06/25/18 which was notable for: 3 sessile polyps in the ascending colon 5-8 mm in size, removed 3 sessile polyps in the descending, hepatic flexure, ascending 3-6 mm in size, removed A frond-like/villous, fungating, polypoid and ulcerated partially obstructing medium sized mass in the sigmoid colon partially circumferential. 4 cm in length. Biopsies were taken. Area proximal to mass was injected with spot Multiple diverticula Internal hemorrhoids  Pathology returned as tubular adenomas in his colon without high-grade dysplasia Biopsy of the mass in the sigmoid revealed adenocarcinoma  He underwent staging CT chest/abdomen/pelvis 06/25/18. Multiple subcentimeter (2-3 mm) lung nodules noted. There is no evidence of metastatic disease. Findings of emphysema were also noted. Mass identified in mid sigmoid colon  CEA: 1.4  He was seen and evaluated by both cardiology and endocrinology. He was found to be thyrotoxic by the endocrinologist after being referred there by cardiology for his A. fib. Given these findings, he was not cleared by endocrinology for surgery. He has been on treatment for this and his last set of labs per Dr. Cruzita Lederer were improving. He denies any further blood in the stool now that he is off anticoagulation.  He is here today with his wife. One of his other daugters recently underwent a robotic colon resection in [place] with Dr. Phill Mutter for colon cancer. He denies any changes in his health aside from now having normal thyroid function. He has been off steroids for over 6 weeks now   PMH: HTN  (well controlled on oral antihypertensive), Afib (off Eliquis now, follows iwth Dr. Marylou Mccoy), COPD (follow with Dr. Melvyn Novas), HLD (well controlled on statin), ?CHF  PSH: Right carotid endarterectomy by Dr. Doren Custard 6 years ago Left lower extremity PAD with multiple bypasses at [facility] with Dr. Graylon Good VATS-right lower lobectomy for an early lung cancer at [facility] He denies any prior abdominal operations  FHx: Denies FHx of malignancy  Social: Denies use of tobacco/drugs. Reformed smoker. Social EtOH use. 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  ROS: A comprehensive 10 system review of systems was completed with the patient and pertinent findings as noted above.   Past Medical History:  Diagnosis Date  . AAA (abdominal aortic aneurysm) ([place]) LAST ABDOMINAL US 10-20-17 3.3 CM   MONITORED BY DR [name]  . Anxiety   . Arthritis   . Atrial fibrillation ([place])   . Basal cell carcinoma   . Bilateral carotid artery stenosis DUPLEX 12-29-2012  BY DR [facility]   BILATERAL ICA STENOSIS 60-79%  . BPH (benign prostatic hypertrophy)   . Chronic diastolic heart failure ([place])   . Chronic kidney disease    stage 3, pt unaware  . Colon cancer ([place])   . Complication of anesthesia    had nausea after carotid artery surgery, states the medicine given to help the nausea, made it worse  . COPD (chronic obstructive pulmonary disease) ([place])   . Depression   . GERD (gastroesophageal reflux disease)   . Glaucoma BOTH EYES   Dr Gershon Crane  . History of basal cell carcinoma excision   . History of lung cancer APRIL 2012  SQUAMOUS  CELL---- S/P RIGHT LOWER LOBECTOMY AT [facility] --  NO CHEMORADIATION---  NO RECURRENCE    ONCOLOGIST- DR Tressie Stalker  LOV [facility] 10-27-2012  . History of pneumothorax    pt unaware  . Hx of adenomatous colonic polyps 2005    X 2; 1 hyperplastic polyp; Dr Olevia Perches  . Hyperlipidemia   . Hypertension   . Impaired fasting glucose 2007   108; A1c5.4%  . Lesion of bladder   .  Lung cancer ([place]) 01/25/2012  . Microhematuria   . OSA (obstructive sleep apnea) 08/29/2015   CPAP SET ON 10  . PAD (peripheral artery disease) ([place]) ABI'S  JAN 2014  0.65 ON RIGHT ;  1.04 ON LEFT  . Peripheral vascular disease ([place]) S/P ANGIOPLASTY AND STENTING   FOLLOWED  BY DR [name]  . Pleural effusion on right 11/18/2018   Chronic, noted on CXR  . Spinal stenosis Sept. 2015  . Status post placement of implantable loop recorder   . Stroke [facility]) Jul 07, 2013   mini  TIA  . Thoracic aorta atherosclerosis ([place])   . Thyrotoxicosis    amiodarone induced  . Urgency of urination     Past Surgical History:  Procedure Laterality Date  . ANGIO PLASTY     X 4 in legs  . AORTOGRAM  07-27-2002   MILD DIFFUSE ILIAC ARTERY OCCLUSIVE DISEASE /  LEFT RENAL ARTERY 20%/ PATENT LEFT FEM-POP GRAFT/ MILD SFA AND POPLITEAL ARTERY OCCLUSIVE DISEASE W/ SEVERE KIDNEY OCCLUSIVE DISEASE  . BASAL CELL CARCINOMA EXCISION     MULTIPLE TIMES--  RIGHT FOREARM, CHEEKS, AND BACK  . BIOPSY  06/25/2018   Procedure: BIOPSY;  Surgeon: Rush Landmark Telford Nab., MD;  Location: [place];  Service: Gastroenterology;;  . CARDIOVASCULAR STRESS TEST  12-08-2012  DR MCLEAN   NORMAL LEXISCAN WITH NO EXERCISE NUCLEAR STUDY/ EF 66%/   NO ISCHEMIA/ NO SIGNIFICANT CHANGE FROM PRIOR STUDY  . CARDIOVERSION N/A 02/14/2018   Procedure: CARDIOVERSION;  Surgeon: Larey Dresser, MD;  Location: [facility] ENDOSCOPY;  Service: Cardiovascular;  Laterality: N/A;  . CAROTID ANGIOGRAM N/A 07/10/2013   Procedure: CAROTID ANGIOGRAM;  Surgeon: Elam Dutch, MD;  Location: [facility] CATH LAB;  Service: Cardiovascular;  Laterality: N/A;  . CAROTID ENDARTERECTOMY Right 07-14-13   cea  . CATARACT EXTRACTION W/ INTRAOCULAR LENS  IMPLANT, BILATERAL    . colon polyectomy    . COLONOSCOPY WITH PROPOFOL N/A 06/25/2018   Procedure: COLONOSCOPY WITH PROPOFOL;  Surgeon: Rush Landmark Telford Nab., MD;  Location: [place];  Service: Gastroenterology;   Laterality: N/A;  . CYSTOSCOPY W/ RETROGRADES Bilateral 01/21/2013   Procedure: CYSTOSCOPY WITH RETROGRADE PYELOGRAM;  Surgeon: Molli Hazard, MD;  Location: [facility];  Service: Urology;  Laterality: Bilateral;   CYSTO, BLADDER BIOPSY, BILATERAL RETROGRADE PYELOGRAM  RAD TECH FROM RADIOLOGY PER JOY  . CYSTOSCOPY WITH BIOPSY N/A 01/21/2013   Procedure: CYSTOSCOPY WITH BIOPSY;  Surgeon: Molli Hazard, MD;  Location: [facility];  Service: Urology;  Laterality: N/A;  . ENDARTERECTOMY Right 07/14/2013   Procedure: ENDARTERECTOMY CAROTID;  Surgeon: Angelia Mould, MD;  Location: [place];  Service: Vascular;  Laterality: Right;  . EP IMPLANTABLE DEVICE N/A 08/12/2015   Procedure: Loop Recorder Insertion;  Surgeon: Thompson Grayer, MD;  Location: [place] CV LAB;  Service: Cardiovascular;  Laterality: N/A;  . ESOPHAGOGASTRODUODENOSCOPY (EGD) WITH PROPOFOL N/A 06/25/2018   Procedure: ESOPHAGOGASTRODUODENOSCOPY (EGD) WITH PROPOFOL;  Surgeon: Rush Landmark Telford Nab., MD;  Location: [place];  Service: Gastroenterology;  Laterality: N/A;  . ESOPHAGOGASTRODUODENOSCOPY (EGD) WITH PROPOFOL N/A 07/10/2018   Procedure: ESOPHAGOGASTRODUODENOSCOPY (EGD) WITH PROPOFOL;  Surgeon: Milus Banister, MD;  Location: [facility] ENDOSCOPY;  Service: Endoscopy;  Laterality: N/A;  . EUS N/A 07/10/2018   Procedure: UPPER ENDOSCOPIC ULTRASOUND (EUS) RADIAL;  Surgeon: Milus Banister, MD;  Location: [facility] ENDOSCOPY;  Service: Endoscopy;  Laterality: N/A;  . EYE SURGERY Right   . FEMORAL-POPLITEAL BYPASS GRAFT Left [facility]   AND 2001  IN [place]  . FEMORAL-POPLITEAL BYPASS GRAFT Left 08/30/2015   Procedure: REVISION OF BYPASS GRAFT Left  FEMORAL-POPLITEAL ARTERY;  Surgeon: Angelia Mould, MD;  Location: [place];  Service: Vascular;  Laterality: Left;  . FINE NEEDLE ASPIRATION N/A 07/10/2018   Procedure: FINE NEEDLE ASPIRATION (FNA) LINEAR;  Surgeon: Milus Banister, MD;  Location: [facility] ENDOSCOPY;  Service: Endoscopy;  Laterality: N/A;  . LARYNGOSCOPY  06-27-2004   BX VOCAL CORD  (LEUKOPLAKIA)  PER PT NO ISSUES SINCE  . LEFT HEART CATH AND CORONARY ANGIOGRAPHY N/A 08/20/2017   Procedure: LEFT HEART CATH AND CORONARY ANGIOGRAPHY;  Surgeon: Larey Dresser, MD;  Location: [place] CV LAB;  Service: Cardiovascular;  Laterality: N/A;  . LOWER EXTREMITY ANGIOGRAM Bilateral 08/29/2015   Procedure: Lower Extremity Angiogram;  Surgeon: Angelia Mould, MD;  Location: [place] CV LAB;  Service: Cardiovascular;  Laterality: Bilateral;  . LUNG LOBECTOMY  01/24/2011    RIGHT UPPER LOBE  (SQUAMOUS CELL CARCINOMA) Dr Dorthea Cove , [facility]. No chemotherapyor radiation  . PATCH ANGIOPLASTY Right 07/14/2013   Procedure: PATCH ANGIOPLASTY;  Surgeon: Angelia Mould, MD;  Location: [place];  Service: Vascular;  Laterality: Right;  . PATCH ANGIOPLASTY Left 08/30/2015   Procedure: VEIN PATCH ANGIOPLASTY OF PROXIMAL Left BYPASS GRAFT;  Surgeon: Angelia Mould, MD;  Location: [place];  Service: Vascular;  Laterality: Left;  . PERIPHERAL VASCULAR CATHETERIZATION N/A 08/29/2015   Procedure: Abdominal Aortogram;  Surgeon: Angelia Mould, MD;  Location: [place] CV LAB;  Service: Cardiovascular;  Laterality: N/A;  . POLYPECTOMY  06/25/2018   Procedure: POLYPECTOMY;  Surgeon: Rush Landmark Telford Nab., MD;  Location: [place];  Service: Gastroenterology;;  . Lia Foyer INJECTION  06/25/2018   Procedure: SUBMUCOSAL INJECTION;  Surgeon: Irving Copas., MD;  Location: [place];  Service: Gastroenterology;;  . TEE WITHOUT CARDIOVERSION N/A 02/14/2018   Procedure: TRANSESOPHAGEAL ECHOCARDIOGRAM (TEE);  Surgeon: Larey Dresser, MD;  Location: [facility] ENDOSCOPY;  Service: Cardiovascular;  Laterality: N/A;  . trabecular surgery     OS  . TRANSTHORACIC ECHOCARDIOGRAM  12-29-2012  DR [facility]   MILD LVH/  LVSF NORMAL/ EF 67-54%/  GRADE I DIASTOLIC DYSFUNCTION     Family History  Problem Relation Age of Onset  . Stroke Mother        mini strokes  . Alcohol abuse Father   . Heart disease Father        MI after 40  . Stroke Father   . Hypertension Father   . Heart attack Father   . Heart disease Paternal Aunt        several  . Hypertension Paternal Aunt        several  . Stroke Paternal Aunt        several  . Stroke Paternal Uncle        several  . Heart disease Paternal Uncle        several;2 had MI pre 24  . Cancer Daughter 52  breast ca, also with benign sessile polyp     Social:  reports that he quit smoking about 4 years ago. His smoking use included cigarettes. He has a 25.00 pack-year smoking history. He has never used smokeless tobacco. He reports current alcohol use. He reports that he does not use drugs.  Allergies:  Allergies  Allergen Reactions  . Niacin-Lovastatin Er Shortness Of Breath and Other (See Comments)    Dyspnea, flushing  . Penicillins Hives, Shortness Of Breath and Other (See Comments)    Flushing  & dyspnea Because of a history of documented adverse serious drug reaction;Medi Alert bracelet  is recommended PCN reaction causing immediate rash, facial/tongue/throat swelling, SOB or lightheadedness with hypotension: Yes PCN reaction causing severe rash involving mucus membranes or skin necrosis: No PCN reaction occurring within the last 10 years: NO PCN reaction that required hospitalization: NO  . Sulfonamide Derivatives Hives, Shortness Of Breath and Other (See Comments)    Flushing & dyspnea Because of a history of documented adverse serious drug reaction;Medi Alert bracelet  is recommended  . Atorvastatin Other (See Comments)    Myalgias & athralgias- takes Crestor, DOES NOT LIKE LIPITOR    Medications: I have reviewed the patient's current medications.  No results found for this or any previous visit (from the past 48 hour(s)).  No results found.  ROS - all of the below systems have been  reviewed with the patient and positives are indicated with bold text General: chills, fever or night sweats Eyes: blurry vision or double vision ENT: epistaxis or sore throat Allergy/Immunology: itchy/watery eyes or nasal congestion Hematologic/Lymphatic: bleeding problems, blood clots or swollen lymph nodes Endocrine: temperature intolerance or unexpected weight changes Breast: new or changing breast lumps or nipple discharge Resp: cough, shortness of breath, or wheezing CV: chest pain or dyspnea on exertion GI: as per HPI GU: dysuria, trouble voiding, or hematuria MSK: joint pain or joint stiffness Neuro: TIA or stroke symptoms Derm: pruritus and skin lesion changes Psych: anxiety and depression  PE Blood pressure 124/63, pulse 62, temperature 98.6 F (37 C), temperature source Oral, resp. rate 16, SpO2 97 %. Constitutional: NAD; conversant; no deformities Eyes: Moist conjunctiva; no lid lag; anicteric; PERRL Neck: Trachea midline; no thyromegaly Lungs: Normal respiratory effort; no tactile fremitus CV: RRR; no palpable thrills; no pitting edema GI: Abd soft, NT/ND; no palpable hepatosplenomegaly MSK: Normal gait; no clubbing/cyanosis Psychiatric: Appropriate affect; alert and oriented x3 Lymphatic: No palpable cervical or axillary lymphadenopathy  No results found for this or any previous visit (from the past 48 hour(s)).  No results found.  A/P: Mr. Wale is a very pleasant 16yoF with hx of HTN, Afib (off Eliquis x86mo), ?CHF, possible COPD, HLD with newly diagnosed adenocarcinoma of the mid sigmoid colon  CT chest/abdomen/pelvis to ensure no evidence of metastatic disease; there is a large mediastinal node which the patient and his wife state they have been undergoing surveillance for given his history of lung cancer with [facility]. He reportedly had this biopsied 6 or 7 months ago and was told it was benign. They are planning on going surveillance of this. CEA: 1.4  -The  anatomy and physiology of the GI tract was discussed at length with the patient again today. The pathophysiology of colon cancer was discussed at length as well. -We discussed laparoscopic, possible open, sigmoid colectomy with colorectal anastomosis, flexible sigmoidoscopy, possible takedown of splenic flexure. -The planned procedure, material risks (including, but not limited to, pain, bleeding, infection, scarring, need for  blood transfusion, damage to surrounding structures- blood vessels/nerves/viscus/organs, damage to ureter, urine leak, leak from anastomosis, need for additional procedures, potential for metastasis and/or procedure abortion, need for chemotherapy, need for stoma which may be permanent, hernia, recurrence, pneumonia, heart attack, stroke, death) benefits and alternatives to surgery were discussed at length. The patient's questions were answered to his satisfaction, he voiced understanding and elected to proceed with surgery once he is cleared. Additionally, we discussed typical postoperative expectations and the recovery process.  Sharon Mt. Dema Severin, M.D. General and Colorectal Surgery [facility]

## 2018-12-12 NOTE — Transfer of Care (Signed)
Immediate Anesthesia Transfer of Care Note  Patient: Troy Kenner [name]  Procedure(s) Performed: XI ROBOT POSSIBLE OPEN  SIGMOID COLECTOMY POSSIBLE TAKEDOWN OF SPLENIC FLEXURE ERAS PATHWAY (N/A Abdomen) FLEXIBLE SIGMOIDOSCOPY (N/A Rectum)  Patient Location: PACU  Anesthesia Type:General  Level of Consciousness: awake, alert  and oriented  Airway & Oxygen Therapy: Patient Spontanous Breathing and Patient connected to face mask oxygen  Post-op Assessment: Report given to RN and Post -op Vital signs reviewed and stable  Post vital signs: Reviewed and stable  Last Vitals:  Vitals Value Taken Time  BP 126/64 12/12/2018 11:13 AM  Temp    Pulse 54 12/12/2018 11:16 AM  Resp 13 12/12/2018 11:16 AM  SpO2 100 % 12/12/2018 11:16 AM  Vitals shown include unvalidated device data.  Last Pain:  Vitals:   12/12/18 0535  TempSrc: Oral         Complications: No apparent anesthesia complications

## 2018-12-12 NOTE — Op Note (Addendum)
12/12/2018  11:09 AM  PATIENT:  Troy Kenner [name]  78 y.o. male  Patient Care Team: Binnie Rail, MD as PCP - General (Internal Medicine) Thompson Grayer, MD as PCP - Electrophysiology (Cardiology) Everardo All, MD (Hematology and Oncology) Lerry Paterson, MD as Referring Physician (Thoracic Surgery) Larey Dresser, MD as Consulting Physician (Cardiology) Susa Day, MD as Consulting Physician (Orthopedic Surgery)  PRE-OPERATIVE DIAGNOSIS:  Sigmoid colon cancer  POST-OPERATIVE DIAGNOSIS:  Same  PROCEDURE:   1. Robotic-assisted low anterior resection with double stapled colorectal anastomosis 2. Flexible sigmoidoscopy  SURGEON:  Sharon Mt. Dema Severin, MD  ASSISTANT: Michael Boston, MD  ANESTHESIA:   general  COUNTS:  Sponge, needle and instrument counts were reported correct x2 at the conclusion of the operation.  EBL: 50 mL  DRAINS: None  SPECIMEN: 1. Sigmoid colon + proximal most aspect of rectum as one unit; open end is proxima 2. Proximal anastomotic donut 3. Distal anastomotic donut  COMPLICATIONS: None  FINDINGS: Mildly cirrhotic appearence to the liver; no visible masses on surface of the liver. Visualized peritoneum appeared normal as well. Tattoo identified in sigmoid colon; mass palpable just proximal to this; regardless, wide margins were taken. The anastomosis is located 15 cm from the anal verge by sigmoidoscopy with straightened scope.  DISPOSITION: PACU in satisfactory condition  INDICATION: Mr. Kerce is a very pleasant 89yoF with hx of HTN, Afib (off Eliquis x73mo, ?CHF, possible COPD, HLD referred by Dr. MRush Landmarkfollowing a colonoscopy done 06/25/18. He had presented to the hospital with what he thought was a TIA but was subsequently found to have anemia as his underlying issue. He underwent colonoscopy with Dr. MRush Landmark9/25/19 which was notable for: 3 sessile polyps in the ascending colon 5-8 mm in size, removed 3 sessile polyps in the  descending, hepatic flexure, ascending 3-6 mm in size, removed A frond-like/villous, fungating, polypoid and ulcerated partially obstructing medium sized mass in the sigmoid colon partially circumferential. 4 cm in length. Biopsies were taken. Area proximal to mass was injected with spot Multiple diverticula Internal hemorrhoids  Pathology returned as tubular adenomas in his colon without high-grade dysplasia Biopsy of the mass in the sigmoid revealed adenocarcinoma  He underwent staging CT chest/abdomen/pelvis 06/25/18. Multiple subcentimeter (2-3 mm) lung nodules noted. There is no evidence of metastatic disease. Findings of emphysema were also noted. Mass identified in mid sigmoid colon  CEA: 1.4  He was seen and evaluated by both cardiology and endocrinology. He was found to be thyrotoxic by the endocrinologist after being referred there by cardiology for his A. fib. Given these findings, he was not cleared by endocrinology for surgery. He has been on treatment for this and his last set of labs per Dr. GCruzita Ledererwere improving. He denies any further blood in the stool now that he is off anticoagulation.  One of his other [place]. He denies any changes in his health aside from now having normal thyroid function. He has been off steroids for over 6 weeks now   Options had been discussed moving forward and he opted to undergo surgery to address this cancer. Please refer to notes elsewhere in the health record for details regarding this discussion.  DESCRIPTION: The patient was identified in preop holding and taken to the OR where he was placed on the operating room table. SCDs were placed. General endotracheal anesthesia was induced without difficulty. He was then positioned in lithotomy using AZenia Resides  stirrups. A foley catheter was placed under sterile conditions by the nursing team. All pressure  points were padded and checked. He was then prepped and draped in the usual sterile fashion. A surgical timeout was performed indicating the correct patient, procedure, positioning and need for preoperative antibiotics.   An OG tube then placed by the anesthesia team and was confirmed to be placed to suction.  Beginning at [facility] point, a small stab incision was made and a Veress needle was introduced into the peritoneal cavity.  Intraperitoneal location was confirmed using the aspiration and saline drop test.  Insufflation then commenced to maximum pressure of 15 mmHg with CO2.  Following this, an 8 mm incision was made just superior and lateral to the umbilicus and an 8 mm robotic trocar was carefully placed.  The laparoscope was then inserted in the peritoneal cavity and inspection revealed no evidence of Veress needle site or trocar site complications.  Bilateral transversus abdominis plane blocks were then performed using a dilute mixture of Exparel and Marcaine.  Under laparoscopic visualization, 3 additional 8 mm trochars were placed in a line along the right hemiabdomen extending from the right ASIS up towards the left costal margin.  An additional 5 mm assistant port was then placed in the right lower quadrant under direct visualization. Diagnostic laparoscopy revealed a mildly cirrhotic appearing liver without evidence of surface metastases. The visualized peritoneum also appeared normal without evidence of peritoneal metastases.  The small bowel was swept out of the pelvis and the omentum was reflected cephalad.  The sigmoid colon was identified and quite redundant.  A tattoo was visible in the mid sigmoid colon.  The mass was actually palpable just proximal to this tattoo.  At this point in time, the robot was docked.  I then went to the console.  Beginning laterally, attachments of the sigmoid colon to the intersigmoid fossa were taken down.  The left ureter was readily identified from this  approach.  This plane was developed anterior to the left ureter proximally and distally, sweeping the ureter "down." The gonadal vessels were also preserved.  The Dottie Vaquerano line of Toldt lateral to the descending colon was incised sharply up to the level of the splenic flexure.  The descending mesentery was mobilized medially.  The sigmoid mesentery was mobilized medially again leaving the left ureter "down."  Attention was then turned to the medial approach of the dissection.  The rectosigmoid colon was grasped and elevated anteriorly.  Over the sacral promontory, the peritoneum was incised overlying the proximal mesorectum.  The avascular plane between the presacral fascia and the fascia propria of the rectum was cleared identified.  The hypogastric nerves were identified and preserved free of injury.  This plane was developed proximally distally.  Up to the level of the IMA, the peritoneum was then taken down.  The fat surrounding the IMA pedicle was cleared.  The left ureter was again identified and confirmed to be well away from our plane dissection and down.  The IMA was then circumferentially dissected.  Due to his history of vascular disease and a somewhat firm appearing IMA pedicle, the decision was made to divide this using a Daryl Quiros load with the robotic stapler.  A 12 mm trocar was inserted at the location of the planned Pfannenstiel, 3 fingerbreadths above the pubic symphysis and well above the bladder.  The stapler was inserted.  The left ureter was again identified and confirmed to be down.  The IMA had been skeletonized and  was approximately 1 cm from its takeoff from the aorta.  At this location, the stapler was closed, held, and then fired.  The stump was inspected and noted to be hemostatic.  The stapler was removed.  Working cephalad and anterior to the left ureter, the IMV was identified and ligated using the vessel sealer.  Both the IMA and IMV stumps were inspected and again noted to be hemostatic.   Additional retroperitoneal attachments to the descending mesentery were swept down.   Attention was then turned to the distal site of division.  Again, well below the level of any tattoo, the proximal rectum was identified at the location of the sacral promontory was, the tinea had splayed, and there was loss of appendices epiploica.  The mesorectum at this level was cleared using the vessel sealer.  The remaining rectum was healthy and well perfused.  Using 2 firings of the 45 mm green load robotic stapler, the rectum was divided.  The staple line on the stump was inspected and noted to be complete and intact.  The site of proximal division was then selected again well away from any tattoo and above the level of the palpable mass.  The mesocolon out to the level of the planned site of division was then divided. This corresponded to the location where the sigmoid met the descending colon.  The remnant colon proximal to the site of planned transection was pink and well perfused.  This laid within the pelvis without any tension and remained in that location.  Attention was then turned to the extracorporeal portion of the procedure.  The robotic instruments were removed under direct visualization.  The robot was then undocked.  At the location of the 12 mm port in the Pfannenstiel site, the skin incision was widened using a scalpel.  Subcutaneous tissue was divided with electrocautery.  The anterior sheath of the rectus fascia was identified and incised.  Flaps were then raised cephalad and caudad separating the anterior sheath from the underlying rectus.  The peritoneum had been entered with the 12 mm port and this opening was extended staying well above the bladder.  An D[place]ea wound protector was placed.  The specimen was then delivered through the wound protector onto the field.  The tattoo and mass were identified and palpable.  The location of planned proximal division was identified.  The segment of colon was  clearly well perfused and healthy in appearance.  A pursestring clamp was applied to the colon at this level.  A 2-0 Prolene on a Keith needle was passed.  The specimen was then divided using Mayo scissors and passed off.  Open end proximal.  The specimen was inspected and again the mass confirmed to be well away from either or sites of division.  The pursestring clamp was removed and additional 3-0 silk sutures were used as "belt loops" on the pursestring suture line.  EEA sizers were passed.  A 29 mm EEA stapler was selected.  The anvil was placed and the pursestring was tied.  Some fatty appendages were cleared. The marginal artery was palpable at this location and had a palpable pulse.  The colon was pink.  This was placed back into the abdomen.  A cap was placed over the wound protector.  The abdomen is reinsufflated with CO2.  I then went below.  EEA sizers were passed under direct visualization.  The stapler was then passed under direct visualization and the spike deployed just anterior to the staple line.  The components were mated.  Orientation was then examined and confirmed.  There was no twisting of the colon or mesentery.  There was no small bowel underneath the mesentery.  This reached easily without any tension.  The stapler was then closed, held, and fired.  Stapler was removed.  The donuts were inspected and noted to be intact and complete.  These were then passed off the specimen.    Attention was then turned to performing a leak test.  The colon proximal to the anastomosis was clamped.  A flexible sigmoidoscope was passed.  The rectum was insufflated with CO2.  The pelvis had been filled with sterile saline.  There was no bubbles noted.  The anastomosis was air tight, patent, pink, hemostatic and tension-free.  This was located 15 cm from the anal verge with a straightened sigmoidoscope. The air was exhausted.  Omentum was placed back in the pelvis.  The irrigation was evacuated.  Under direct  visualization, all trochars were removed and the McDonald wound protector was removed.  We then switched to clean instruments, drapes, and new gowns/gloves.  Attention was turned to closing the abdomen.  The peritoneum was closed under direct visualization using a running 0 Vicryl suture.  The fascia at the Pfannenstiel site was approximated using 2 running #1 PDS sutures.  The skin of all incision sites was closed with 4 Monocryl subcuticular sutures.  Dermabond was applied.  A honeycomb dressing was applied to the Pfannenstiel incision.  The patient was then taken out of lithotomy, awakened from anesthesia, extubated, and transferred to a stretcher for transport to PACU in satisfactory condition.

## 2018-12-12 NOTE — Discharge Instructions (Signed)
POST OP INSTRUCTIONS AFTER COLON SURGERY  1. DIET: Be sure to include lots of fluids daily to stay hydrated - 64oz of water per day (8, 8 oz glasses).  Avoid fast food or heavy meals for the first couple of weeks as your are more likely to get nauseated. Avoid raw/uncooked fruits or vegetables for the first 4 weeks (its ok to have these if they are blended into smoothie form). If you have fruits/vegetables, make sure they are cooked until soft enough to mash on the roof of your mouth and chew your food well. Otherwise, diet as tolerated.  2. Take your usually prescribed home medications unless otherwise directed.  3. PAIN CONTROL: a. Pain is best controlled by a usual combination of three different methods TOGETHER: i. Ice/Heat ii. Over the counter pain medication iii. Prescription pain medication b. Most patients will experience some swelling and bruising around the surgical site.  Ice packs or heating pads (30-60 minutes up to 6 times a day) will help. Some people prefer to use ice alone, heat alone, alternating between ice & heat.  Experiment to what works for you.  Swelling and bruising can take several weeks to resolve.   c. It is helpful to take an over-the-counter pain medication regularly for the first few weeks: i. Ibuprofen (Motrin/Advil) - 200mg  tabs - take 3 tabs (600mg ) every 6 hours as needed for pain (unless you have been directed previously to avoid NSAIDs/ibuprofen) ii. Acetaminophen (Tylenol) - you may take 650mg  every 6 hours as needed. You can take this with motrin as they act differently on the body. If you are taking a narcotic pain medication that has acetaminophen in it, do not take over the counter tylenol at the same time. iii. NOTE: You may take both of these medications together - most patients  find it most helpful when alternating between the two (i.e. Ibuprofen at 6am, tylenol at 9am, ibuprofen at 12pm ...) d. A  prescription for pain medication should be given to you  upon discharge.  Take your pain medication as prescribed if your pain is not adequatly controlled with the over-the-counter pain reliefs mentioned above.  4. Avoid getting constipated.  Between the surgery and the pain medications, it is common to experience some constipation.  Increasing fluid intake and taking a fiber supplement (such as Metamucil, Citrucel, FiberCon, MiraLax, etc) 1-2 times a day regularly will usually help prevent this problem from occurring.  A mild laxative (prune juice, Milk of Magnesia, MiraLax, etc) should be taken according to package directions if there are no bowel movements after 48 hours.    5. Dressing: Your incisions are covered in Dermabond which is like sterile superglue for the skin. This will come off on it's own in a couple weeks. It is waterproof and you may bathe normally starting the day after your surgery in a shower. Avoid baths/pools/lakes/oceans until your wounds have fully healed.  6. ACTIVITIES as tolerated:   a. Avoid heavy lifting (>10lbs or 1 gallon of milk) for the next 6 weeks. b. You may resume regular daily activities as tolerated--such as daily self-care, walking, climbing stairs--gradually increasing activities as tolerated.  If you can walk 30 minutes without difficulty, it is safe to try more intense activity such as jogging, treadmill, bicycling, low-impact aerobics.  c. DO NOT PUSH THROUGH PAIN.  Let pain be your guide: If it hurts to do something, don't do it. d. Dennis Bast may drive when you are no longer taking prescription pain medication, you  can comfortably wear a seatbelt, and you can safely maneuver your car and apply brakes.  7. FOLLOW UP in our office a. Please call [facility] at [phone] to set up an appointment to see your surgeon in the office for a follow-up appointment approximately 2 weeks after your surgery. b. Make sure that you call for this appointment the day you arrive home to insure a convenient appointment time.  9. If you  have disability or family leave forms that need to be completed, you may have them completed by your primary care physician's office; for return to work instructions, please ask our office staff and they will be happy to assist you in obtaining this documentation   When to call us [phone]: 1. Poor pain control 2. Reactions / problems with new medications (rash/itching, etc)  3. Fever over 101.5 F (38.5 C) 4. Inability to urinate 5. Nausea/vomiting 6. Worsening swelling or bruising 7. Continued bleeding from incision. 8. Increased pain, redness, or drainage from the incision  The clinic staff is available to answer your questions during regular business hours (8:30am-5pm).  Please dont hesitate to call and ask to speak to one of our nurses for clinical concerns.   A surgeon from [facility] is always on call at the hospitals   If you have a medical emergency, go to the nearest emergency room or call 911.  [facility], [place] [address] [place], [place],   [address] MAIN: [phone] FAX: [phone] [email or web address]

## 2018-12-13 LAB — CBC
HCT: 34.9 % — ABNORMAL LOW (ref 39.0–52.0)
Hemoglobin: 10.6 g/dL — ABNORMAL LOW (ref 13.0–17.0)
MCH: 28 pg (ref 26.0–34.0)
MCHC: 30.4 g/dL (ref 30.0–36.0)
MCV: 92.3 fL (ref 80.0–100.0)
NRBC: 0 % (ref 0.0–0.2)
Platelets: 245 10*3/uL (ref 150–400)
RBC: 3.78 MIL/uL — ABNORMAL LOW (ref 4.22–5.81)
RDW: 17 % — ABNORMAL HIGH (ref 11.5–15.5)
WBC: 7 10*3/uL (ref 4.0–10.5)

## 2018-12-13 LAB — BASIC METABOLIC PANEL
Anion gap: 8 (ref 5–15)
BUN: 15 mg/dL (ref 8–23)
CO2: 23 mmol/L (ref 22–32)
Calcium: 8.8 mg/dL — ABNORMAL LOW (ref 8.9–10.3)
Chloride: 104 mmol/L (ref 98–111)
Creatinine, Ser: 1 mg/dL (ref 0.61–1.24)
GFR calc Af Amer: >60 mL/min (ref >60)
GFR calc non Af Amer: >60 mL/min (ref >60)
Glucose, Bld: 125 mg/dL — ABNORMAL HIGH (ref 70–99)
Potassium: 4.5 mmol/L (ref 3.5–5.1)
Sodium: 135 mmol/L (ref 135–145)

## 2018-12-13 LAB — MAGNESIUM: Magnesium: 1.9 mg/dL (ref 1.7–2.4)

## 2018-12-13 LAB — PHOSPHORUS: Phosphorus: 3.9 mg/dL (ref 2.5–4.6)

## 2018-12-13 NOTE — Progress Notes (Signed)
1 Day Post-Op   Subjective/Chief Complaint: No complaints. Feels ok. Passing flatus   Objective: Vital signs in last 24 hours: Temp:  [97.4 F (36.3 C)-98.3 F (36.8 C)] 98.3 F (36.8 C) (03/14 3474) Pulse Rate:  [52-65] 58 (03/14 0613) Resp:  [12-18] 15 (03/14 0613) BP: (119-152)/(64-72) 152/71 (03/14 0613) SpO2:  [98 %-100 %] 99 % (03/14 2595) Weight:  [88.8 kg] 88.8 kg (03/14 0624) Last BM Date: 12/11/18  Intake/Output from previous day: 03/13 0701 - 03/14 0700 In: 2525 [P.O.:200; I.V.:2225; IV Piggyback:100] Out: 1600 [Urine:1550; Blood:50] Intake/Output this shift: Total I/O In: 108.1 [I.V.:108.1] Out: 150 [Urine:150]  General appearance: alert and cooperative Resp: clear to auscultation bilaterally Cardio: regular rate and rhythm GI: soft, mild tenderness. good bs. incisions look good  Lab Results:  Recent Labs    12/13/18 0424  WBC 7.0  HGB 10.6*  HCT 34.9*  PLT 245   BMET Recent Labs    12/13/18 0424  NA 135  K 4.5  CL 104  CO2 23  GLUCOSE 125*  BUN 15  CREATININE 1.00  CALCIUM 8.8*   PT/INR No results for input(s): LABPROT, INR in the last 72 hours. ABG No results for input(s): PHART, HCO3 in the last 72 hours.  Invalid input(s): PCO2, PO2  Studies/Results: No results found.  Anti-infectives: Anti-infectives (From admission, onward)   Start     Dose/Rate Route Frequency Ordered Stop   12/12/18 1400  neomycin (MYCIFRADIN) tablet 1,000 mg  Status:  Discontinued     1,000 mg Oral 3 times per day 12/12/18 0535 12/12/18 0550   12/12/18 1400  metroNIDAZOLE (FLAGYL) tablet 1,000 mg  Status:  Discontinued     1,000 mg Oral 3 times per day 12/12/18 0535 12/12/18 0550   12/12/18 0600  clindamycin (CLEOCIN) IVPB 900 mg     900 mg 100 mL/hr over 30 Minutes Intravenous 60 min pre-op 12/11/18 1132 12/12/18 0751   12/12/18 0600  gentamicin (GARAMYCIN) 430 mg in dextrose 5 % 100 mL IVPB     5 mg/kg  85.8 kg 110.8 mL/hr over 60 Minutes Intravenous  60 min pre-op 12/11/18 1132 12/12/18 0806      Assessment/Plan: s/p Procedure(s): XI ROBOT  LOW ANTERIOR RESECTION  ERAS PATHWAY (N/A) FLEXIBLE SIGMOIDOSCOPY (N/A) Advance diet. Start fulls later today Ambulate Pod 1  LOS: 1 day    Autumn Messing III 12/13/2018

## 2018-12-13 NOTE — Progress Notes (Signed)
I have reviewed and concur with the student's documentation.  

## 2018-12-13 NOTE — Evaluation (Addendum)
Physical Therapy Evaluation Patient Details Name: Troy Burley [name] MRN: [id number] DOB: [birth year] Today's Date: 12/13/2018   History of Present Illness   78 yo former smoker (25 pack years, quit 07/28/14) with h/o PONV, HTN, GERD, PVD (s/p angioplasty and stenting 2016), stroke (2014), carotid artery stenosis (s/p right CEA 2014), OSA with CPAP, CKD, depression, anxiety, CHF, A-fib (on Eliquis), loop recorder in place, COPD, AAA (Abdominal US 10/20/17), BPH, HLD, h/o lung cancer (s/p right lower lobectomy 2012), sigmoid colon cancer, now s/p  robotic assisted low anterior resection scheduled with Dr. Nadeen Landau.  Clinical Impression  Pt with generalized weakness and decreased tolerance after above procedure. Pt was independent at home with no AD. Pt did stated he walked last night after his surgery with nursing staff, and tolerated walking with RW today as well. Still with a little soreness , and decreased activity tolerance to benefit from continued PT.  Encouraged pt to  also walk with nursing staff in the hallway in order to get in multiple walks a day.     Follow Up Recommendations Follow surgeon's recommendation for DC plan and follow-up therapies(wife mentioned MD wanting SNF, pt showing progress today)    Equipment Recommendations  None recommended by PT    Recommendations for Other Services       Precautions / Restrictions Precautions Precautions: None Restrictions Weight Bearing Restrictions: No      Mobility  Bed Mobility Overal bed mobility: Needs Assistance Bed Mobility: Supine to Sit;Sit to Supine     Supine to sit: Min guard Sit to supine: Min guard   General bed mobility comments: used hand rail and bed was elevated   Transfers Overall transfer level: Needs assistance Min [employer] used: Rolling walker (2 wheeled)                Ambulation/Gait Ambulation/Gait assistance: Counsellor (Feet): 150 Feet Assistive device: Rolling  walker (2 wheeled) Gait Pattern/deviations: Wide base of support     General Gait Details: slightly flexed fixed posture, however stated his belly was a little sore, but really wanted to walk and tolerated fairly well. Stated he had also walked last night with nursing as well.   Stairs            Wheelchair Mobility    Modified Rankin (Stroke Patients Only)       Balance Overall balance assessment: Needs assistance Sitting-balance support: Feet supported Sitting balance-Leahy Scale: Normal     Standing balance support: Bilateral upper extremity supported;During functional activity Standing balance-Leahy Scale: Fair                               Pertinent Vitals/Pain Pain Assessment: 0-10 Pain Score: 3  Pain Location: "belly"  Pain Descriptors / Indicators: Sore Pain Intervention(s): Monitored during session    Home Living Family/patient expects to be discharged to:: Private residence Living Arrangements: Spouse/significant other   Type of Home: House Home Access: Stairs to enter Entrance Stairs-Rails: None Entrance Stairs-Number of Steps: 2 ( one on porch and one on threshold )  Home Layout: Two level;Able to live on main level with bedroom/bathroom Home Equipment: Gilford Rile - 2 wheels      Prior Function Level of Independence: Independent               Hand Dominance        Extremity/Trunk Assessment        Lower Extremity Assessment  Lower Extremity Assessment: Generalized weakness       Communication   Communication: No difficulties  Cognition Arousal/Alertness: Awake/alert Behavior During Therapy: WFL for tasks assessed/performed Overall Cognitive Status: Within Functional Limits for tasks assessed                                        General Comments      Exercises     Assessment/Plan    PT Assessment Patient needs continued PT services  PT Problem List Decreased strength;Decreased activity  tolerance;Decreased mobility       PT Treatment Interventions Gait training;Functional mobility training;Therapeutic exercise    PT Goals (Current goals can be found in the Care Plan section)  Acute Rehab PT Goals Patient Stated Goal: I want to walk again thsi afternoon  PT Goal Formulation: With patient Time For Goal Achievement: 12/27/18 Potential to Achieve Goals: Good    Frequency Min 3X/week   Barriers to discharge        Co-evaluation               AM-PAC PT "6 Clicks" Mobility  Outcome Measure Help needed turning from your back to your side while in a flat bed without using bedrails?: A Little Help needed moving from lying on your back to sitting on the side of a flat bed without using bedrails?: A Little Help needed moving to and from a bed to a chair (including a wheelchair)?: A Little Help needed standing up from a chair using your arms (e.g., wheelchair or bedside chair)?: A Little Help needed to walk in hospital room?: A Little Help needed climbing 3-5 steps with a railing? : A Little 6 Click Score: 18    End of Session   Activity Tolerance: Patient tolerated treatment well Patient left: in bed;with call bell/phone within reach;with family/visitor present Nurse Communication: Mobility status PT Visit Diagnosis: Muscle weakness (generalized) (M62.81)    Time: 1345-1415 PT Time Calculation (min) (ACUTE ONLY): 30 min   Charges:   PT Evaluation $PT Eval Low Complexity: 1 Low PT Treatments $Gait Training: 8-22 mins        Clide Dales, PT Acute Rehabilitation Services Pager: [phone] Office: [phone] 12/13/2018   Clide Dales 12/13/2018, 5:26 PM

## 2018-12-13 NOTE — Evaluation (Addendum)
Occupational Therapy Evaluation Patient Details Name: Troy Cerveny [name] MRN: [id number] DOB: Dec 12, 1940 Today's Date: 12/13/2018    History of Present Illness  78 yo former smoker (25 pack years, quit 07/28/14) with h/o PONV, HTN, GERD, PVD (s/p angioplasty and stenting 2016), stroke (2014), carotid artery stenosis (s/p right CEA 2014), OSA with CPAP, CKD, depression, anxiety, CHF, A-fib (on Eliquis), loop recorder in place, COPD, AAA (Abdominal US 10/20/17), BPH, HLD, h/o lung cancer (s/p right lower lobectomy 2012), sigmoid colon cancer, now s/p  robotic assisted low anterior resection scheduled with Dr. Nadeen Landau.   Clinical Impression   PTA, pt was living with his wife and was independent and not using any DME/AE. Pt currently requiring Min Guard A-Min A for LB ADLs and Min Guard A functional mobility with RW. Pt limited mainly by pain at stomach impacting LB ADLs. Pt's wife reporting that doctors have recommended dc to SNF for rehab due to poor results on returning home after prior surgeries and she has contacted [employer]. Pt would benefit from further acute OT to facilitate safe dc. Recommend dc to post-acute rehab for further OT to optimize safety, independence with ADLs, and return to PLOF.      Follow Up Recommendations  Follow surgeon's recommendation for DC plan and follow-up therapies;Supervision/Assistance - 24 hour    Equipment Recommendations  None recommended by OT    Recommendations for Other Services PT consult     Precautions / Restrictions Precautions Precautions: None Restrictions Weight Bearing Restrictions: No      Mobility Bed Mobility Overal bed mobility: Needs Assistance Bed Mobility: Supine to Sit;Sit to Supine     Supine to sit: Min guard Sit to supine: Min guard   General bed mobility comments: used hand rail and bed was elevated   Transfers Overall transfer level: Needs assistance Equipment used: Rolling walker (2 wheeled) Transfers:  Sit to/from Stand Sit to Stand: Min guard         General transfer comment: Min guard A for safety    Balance Overall balance assessment: Needs assistance Sitting-balance support: Feet supported Sitting balance-Leahy Scale: Normal     Standing balance support: Bilateral upper extremity supported;During functional activity Standing balance-Leahy Scale: Fair                             ADL either performed or assessed with clinical judgement   ADL Overall ADL's : Needs assistance/impaired Eating/Feeding: Supervision/ safety;Set up;Sitting   Grooming: Min guard;Standing   Upper Body Bathing: Supervision/ safety;Set up;Sitting   Lower Body Bathing: Min guard;Sit to/from stand   Upper Body Dressing : Supervision/safety;Set up;Sitting   Lower Body Dressing: Sit to/from stand;Minimal assistance Lower Body Dressing Details (indicate cue type and reason): Due to pain Toilet Transfer: Min guard;Ambulation;RW(Simulated to recliner) Armed forces technical officer Details (indicate cue type and reason): Min Guard A for safety         Functional mobility during ADLs: Min guard;Rolling walker General ADL Comments: Pt presenting with decreased activity tolerance due to pain and fatigue     Vision Baseline Vision/History: Wears glasses Wears Glasses: Reading only;At all times Patient Visual Report: No change from baseline       Perception     Praxis      Pertinent Vitals/Pain Pain Assessment: 0-10 Pain Score: 3  Pain Location: "belly"  Pain Descriptors / Indicators: Sore Pain Intervention(s): Monitored during session;Limited activity within patient's tolerance;Repositioned     Hand Dominance Right  Extremity/Trunk Assessment Upper Extremity Assessment Upper Extremity Assessment: Overall WFL for tasks assessed   Lower Extremity Assessment Lower Extremity Assessment: Defer to PT evaluation   Cervical / Trunk Assessment Cervical / Trunk Assessment: Other  exceptions Cervical / Trunk Exceptions:  s/p  robotic assisted low anterior resection    Communication Communication Communication: No difficulties   Cognition Arousal/Alertness: Awake/alert Behavior During Therapy: WFL for tasks assessed/performed Overall Cognitive Status: Within Functional Limits for tasks assessed                                     General Comments  Wife present throughout    Exercises     Shoulder Instructions      Home Living Family/patient expects to be discharged to:: Private residence Living Arrangements: Spouse/significant other Available Help at Discharge: Family;Available PRN/intermittently Type of Home: House Home Access: Stairs to enter [employer] of Steps: 2 ( one on porch and one on threshold )  Entrance Stairs-Rails: None Home Layout: Two level;Able to live on main level with bedroom/bathroom Alternate Level Stairs-Number of Steps: flight    Bathroom Shower/Tub: Walk-in shower   Bathroom Toilet: Handicapped height     Home Equipment: Environmental consultant - 2 wheels          Prior Functioning/Environment Level of Independence: Independent                 OT Problem List: Decreased range of motion;Decreased activity tolerance;Impaired balance (sitting and/or standing);Decreased knowledge of use of DME or AE;Decreased knowledge of precautions;Pain      OT Treatment/Interventions: Self-care/ADL training;Therapeutic exercise;Energy conservation;DME and/or AE instruction;Therapeutic activities;Patient/family education    OT Goals(Current goals can be found in the care plan section) Acute Rehab OT Goals Patient Stated Goal: Go home OT Goal Formulation: With patient/family Time For Goal Achievement: 12/27/18 Potential to Achieve Goals: Good  OT Frequency: Min 2X/week   Barriers to D/C:            Co-evaluation              AM-PAC OT "6 Clicks" Daily Activity     Outcome Measure Help from another person  eating meals?: None Help from another person taking care of personal grooming?: None Help from another person toileting, which includes using toliet, bedpan, or urinal?: A Little Help from another person bathing (including washing, rinsing, drying)?: A Little Help from another person to put on and taking off regular upper body clothing?: None Help from another person to put on and taking off regular lower body clothing?: A Little 6 Click Score: 21   End of Session Equipment Utilized During Treatment: Rolling walker Nurse Communication: Mobility status  Activity Tolerance: Patient tolerated treatment well Patient left: in bed;with call bell/phone within reach;with family/visitor present  OT Visit Diagnosis: Unsteadiness on feet (R26.81);Other abnormalities of gait and mobility (R26.89);Muscle weakness (generalized) (M62.81);Pain Pain - part of body: (Stomach)                Time: 6295-2841 OT Time Calculation (min): 20 min Charges:  OT General Charges $OT Visit: 1 Visit OT Evaluation $OT Eval Low Complexity: [place], OTR/L Acute Rehab Pager: [phone] Office: [place] 12/13/2018, 5:46 PM

## 2018-12-14 LAB — BASIC METABOLIC PANEL
Anion gap: 9 (ref 5–15)
BUN: 14 mg/dL (ref 8–23)
CO2: 25 mmol/L (ref 22–32)
Calcium: 8.8 mg/dL — ABNORMAL LOW (ref 8.9–10.3)
Chloride: 102 mmol/L (ref 98–111)
Creatinine, Ser: 1.07 mg/dL (ref 0.61–1.24)
GFR calc Af Amer: >60 mL/min (ref >60)
Glucose, Bld: 88 mg/dL (ref 70–99)
POTASSIUM: 4 mmol/L (ref 3.5–5.1)
Sodium: 136 mmol/L (ref 135–145)

## 2018-12-14 LAB — CBC
HCT: 35.2 % — ABNORMAL LOW (ref 39.0–52.0)
Hemoglobin: 10.6 g/dL — ABNORMAL LOW (ref 13.0–17.0)
MCH: 28.3 pg (ref 26.0–34.0)
MCHC: 30.1 g/dL (ref 30.0–36.0)
MCV: 94.1 fL (ref 80.0–100.0)
Platelets: 260 10*3/uL (ref 150–400)
RBC: 3.74 MIL/uL — ABNORMAL LOW (ref 4.22–5.81)
RDW: 17.2 % — ABNORMAL HIGH (ref 11.5–15.5)
WBC: 7.8 10*3/uL (ref 4.0–10.5)
nRBC: 0 % (ref 0.0–0.2)

## 2018-12-14 LAB — MAGNESIUM: MAGNESIUM: 1.9 mg/dL (ref 1.7–2.4)

## 2018-12-14 LAB — PHOSPHORUS: Phosphorus: 2.2 mg/dL — ABNORMAL LOW (ref 2.5–4.6)

## 2018-12-14 NOTE — Progress Notes (Signed)
2 Days Post-Op   Subjective/Chief Complaint: Flatus, no BM yet Feels some rumbling in his stomach Only taking Tylenol for pain   Objective: Vital signs in last 24 hours: Temp:  [97.5 F (36.4 C)-98.2 F (36.8 C)] 98.2 F (36.8 C) (03/15 0549) Pulse Rate:  [57-60] 57 (03/15 0922) Resp:  [15-18] 15 (03/15 0549) BP: (141-162)/(67-81) 143/67 (03/15 0922) SpO2:  [93 %-97 %] 93 % (03/15 0922) Weight:  [87.3 kg] 87.3 kg (03/15 0549) Last BM Date: 12/11/18  Intake/Output from previous day: 03/14 0701 - 03/15 0700 In: 1548.1 [P.O.:1440; I.V.:108.1] Out: 2550 [Urine:2550] Intake/Output this shift: Total I/O In: 30 [P.O.:30] Out: 100 [Urine:100]  General appearance: alert, cooperative and no distress GI: soft, active bowel sounds Incisions c/d/i  Lab Results:  Recent Labs    12/13/18 0424 12/14/18 0357  WBC 7.0 7.8  HGB 10.6* 10.6*  HCT 34.9* 35.2*  PLT 245 260   BMET Recent Labs    12/13/18 0424 12/14/18 0357  NA 135 136  K 4.5 4.0  CL 104 102  CO2 23 25  GLUCOSE 125* 88  BUN 15 14  CREATININE 1.00 1.07  CALCIUM 8.8* 8.8*   PT/INR No results for input(s): LABPROT, INR in the last 72 hours. ABG No results for input(s): PHART, HCO3 in the last 72 hours.  Invalid input(s): PCO2, PO2  Studies/Results: No results found.  Anti-infectives: Anti-infectives (From admission, onward)   Start     Dose/Rate Route Frequency Ordered Stop   12/12/18 1400  neomycin (MYCIFRADIN) tablet 1,000 mg  Status:  Discontinued     1,000 mg Oral 3 times per day 12/12/18 0535 12/12/18 0550   12/12/18 1400  metroNIDAZOLE (FLAGYL) tablet 1,000 mg  Status:  Discontinued     1,000 mg Oral 3 times per day 12/12/18 0535 12/12/18 0550   12/12/18 0600  clindamycin (CLEOCIN) IVPB 900 mg     900 mg 100 mL/hr over 30 Minutes Intravenous 60 min pre-op 12/11/18 1132 12/12/18 0751   12/12/18 0600  gentamicin (GARAMYCIN) 430 mg in dextrose 5 % 100 mL IVPB     5 mg/kg  85.8 kg 110.8 mL/hr  over 60 Minutes Intravenous 60 min pre-op 12/11/18 1132 12/12/18 0806      Assessment/Plan: Sigmoid colon cancer  POD #2 robotic LAR, flex sig by Dr. Dema Severin  Full liquids Await bowel function Good pain control Encourage ambulation/ mobilization  LOS: 2 days    Troy [name] 12/14/2018

## 2018-12-14 NOTE — Anesthesia Postprocedure Evaluation (Signed)
Anesthesia Post Note  Patient: [name]  Procedure(s) Performed: XI ROBOT  LOW ANTERIOR RESECTION  ERAS PATHWAY (N/A Abdomen) FLEXIBLE SIGMOIDOSCOPY (N/A Rectum)     Patient location during evaluation: PACU Anesthesia Type: General Level of consciousness: awake and alert Pain management: pain level controlled Vital Signs Assessment: post-procedure vital signs reviewed and stable Respiratory status: spontaneous breathing, nonlabored ventilation, respiratory function stable and patient connected to nasal cannula oxygen Cardiovascular status: blood pressure returned to baseline and stable Postop Assessment: no apparent nausea or vomiting Anesthetic complications: no    Last Vitals:  Vitals:   12/14/18 0549 12/14/18 0922  BP: (!) 162/74 (!) 143/67  Pulse: (!) 57 (!) 57  Resp: 15   Temp: 36.8 C   SpO2: 93% 93%    Last Pain:  Vitals:   12/14/18 0836  TempSrc:   PainSc: 0-No pain                 Khari Mally S

## 2018-12-15 ENCOUNTER — Encounter (HOSPITAL_COMMUNITY): Payer: Self-pay | Admitting: Surgery

## 2018-12-15 LAB — BASIC METABOLIC PANEL
Anion gap: 8 (ref 5–15)
BUN: 13 mg/dL (ref 8–23)
CO2: 24 mmol/L (ref 22–32)
Calcium: 8.7 mg/dL — ABNORMAL LOW (ref 8.9–10.3)
Chloride: 103 mmol/L (ref 98–111)
Creatinine, Ser: 0.94 mg/dL (ref 0.61–1.24)
GFR calc non Af Amer: >60 mL/min (ref >60)
Glucose, Bld: 92 mg/dL (ref 70–99)
Potassium: 4 mmol/L (ref 3.5–5.1)
Sodium: 135 mmol/L (ref 135–145)

## 2018-12-15 LAB — MAGNESIUM: Magnesium: 1.9 mg/dL (ref 1.7–2.4)

## 2018-12-15 LAB — PHOSPHORUS: PHOSPHORUS: 2.8 mg/dL (ref 2.5–4.6)

## 2018-12-16 ENCOUNTER — Telehealth: Payer: Self-pay

## 2018-12-16 LAB — BASIC METABOLIC PANEL
Anion gap: 9 (ref 5–15)
BUN: 13 mg/dL (ref 8–23)
CO2: 25 mmol/L (ref 22–32)
Calcium: 8.8 mg/dL — ABNORMAL LOW (ref 8.9–10.3)
Chloride: 103 mmol/L (ref 98–111)
Creatinine, Ser: 0.99 mg/dL (ref 0.61–1.24)
GFR calc Af Amer: >60 mL/min (ref >60)
Glucose, Bld: 89 mg/dL (ref 70–99)
Potassium: 4 mmol/L (ref 3.5–5.1)
SODIUM: 137 mmol/L (ref 135–145)

## 2018-12-16 LAB — MAGNESIUM: Magnesium: 2 mg/dL (ref 1.7–2.4)

## 2018-12-16 LAB — PHOSPHORUS: Phosphorus: 3.3 mg/dL (ref 2.5–4.6)

## 2018-12-16 NOTE — Telephone Encounter (Signed)
Has she communicated this to his doctor in the hospital.  Unfortunately I am not able to visit him and see directly how he is doing and can not make any changes to his hospital care.  She really needs to discuss this with the doctors in the hospital

## 2018-12-16 NOTE — Progress Notes (Signed)
Physical Therapy Treatment Patient Details Name: Troy Simonich [name] MRN: [id number] DOB: 10/08/1940 Today's Date: 12/16/2018    History of Present Illness  78 yo former smoker (25 pack years, quit 07/28/14) with h/o PONV, HTN, GERD, PVD (s/p angioplasty and stenting 2016), stroke (2014), carotid artery stenosis (s/p right CEA 2014), OSA with CPAP, CKD, depression, anxiety, CHF, A-fib (on Eliquis), loop recorder in place, COPD, AAA (Abdominal US 10/20/17), BPH, HLD, h/o lung cancer (s/p right lower lobectomy 2012), sigmoid colon cancer, now s/p  robotic assisted low anterior resection scheduled with Dr. Nadeen Landau.    PT Comments    Pt present with increased c/o weakness/fatigue/no energy.  Slowly upgrading po intake.  Assisted with amb pt a decreased distance.  General Gait Details: Very unsteady gait and decreased distance due to increased c/o weakness/fatigue/no energy.  Trial amb without walker, pt required increased assist present with lateral LOB, decreased step length, increased BOS and increased risk for falls.   Pt will need ST Rehab at SNF to regain prior level of mobility.    Follow Up Recommendations  SNF     Equipment Recommendations  None recommended by PT    Recommendations for Other Services       Precautions / Restrictions Precautions Precautions: Fall Restrictions Weight Bearing Restrictions: No    Mobility  Bed Mobility               General bed mobility comments: OOB in recliner   Transfers Overall transfer level: Needs assistance Equipment used: Rolling walker (2 wheeled) Transfers: Sit to/from [employer] Sit to Stand: Min guard;Min assist Stand pivot transfers: Min guard;Min assist       General transfer comment: 50% VC's on proper walker use throughout as well as unsteady with turns and transition from sit to stand and stand to sit  Ambulation/Gait Ambulation/Gait assistance: Min guard;Min assist Gait Distance (Feet): 75  Feet Assistive device: Rolling walker (2 wheeled);None Gait Pattern/deviations: Step-to pattern;Step-through pattern;Decreased step length - left;Decreased stance time - right;Shuffle Gait velocity: decreased    General Gait Details: Very unsteady gait and decreased distance due to increased c/o weakness/fatigue/no energy.  Trial amb without walker, pt required increased assist present with lateral LOB, decreased step length, increased BOS and increased risk for falls.     Stairs             Wheelchair Mobility    Modified Rankin (Stroke Patients Only)       Balance                                            Cognition Arousal/Alertness: Awake/alert Behavior During Therapy: WFL for tasks assessed/performed Overall Cognitive Status: Within Functional Limits for tasks assessed                                        Exercises      General Comments        Pertinent Vitals/Pain Pain Assessment: Faces Faces Pain Scale: Hurts a little bit Pain Location: lower ABD with sit to stand and stand to sit Pain Descriptors / Indicators: Sore;Tender Pain Intervention(s): Monitored during session;Repositioned    Home Living  Prior Function            PT Goals (current goals can now be found in the care plan section)      Frequency    Min 3X/week      PT Plan Current plan remains appropriate    Co-evaluation              AM-PAC PT "6 Clicks" Mobility   Outcome Measure  Help needed turning from your back to your side while in a flat bed without using bedrails?: A Little Help needed moving from lying on your back to sitting on the side of a flat bed without using bedrails?: A Little Help needed moving to and from a bed to a chair (including a wheelchair)?: A Little Help needed standing up from a chair using your arms (e.g., wheelchair or bedside chair)?: A Little Help needed to walk in hospital  room?: A Little Help needed climbing 3-5 steps with a railing? : A Little 6 Click Score: 18    End of Session Equipment Utilized During Treatment: Gait belt Activity Tolerance: Patient tolerated treatment well Patient left: in chair;with call bell/phone within reach Nurse Communication: Mobility status PT Visit Diagnosis: Muscle weakness (generalized) (M62.81)     Time: 1444-5848 PT Time Calculation (min) (ACUTE ONLY): 16 min  Charges:  $Gait Training: 8-22 mins                     Rica Koyanagi  PTA Acute  Rehabilitation Services Pager      [phone] Office      [phone]

## 2018-12-16 NOTE — Progress Notes (Addendum)
LATE ENTRY; NOTE WAS INCOMPLETE YESTERDAY  Subjective No acute events. Feeling well. Tolerating full liquids without n/v. Having flatus and BM. Pain well controlled on oral analgesics.  Objective: Vital signs in last 24 hours: Temp:  [97.8 F (36.6 C)-98.2 F (36.8 C)] 98 F (36.7 C) (03/17 0617) Pulse Rate:  [50-56] 51 (03/17 0617) Resp:  [16-17] 16 (03/17 0617) BP: (137-173)/(66-78) 173/78 (03/17 0617) SpO2:  [94 %-97 %] 94 % (03/17 0617) Weight:  [81.5 kg] 81.5 kg (03/17 0617) Last BM Date: 12/15/18  Intake/Output from previous day: 03/16 0701 - 03/17 0700 In: 930 [P.O.:930] Out: 1325 [Urine:1325] Intake/Output this shift: No intake/output data recorded.  Gen: NAD, comfortable CV: RRR Pulm: Normal work of breathing Abd: Soft, NT/ND. Incisions c/d/i without erythema Ext: SCDs in place  Lab Results: CBC  Recent Labs    12/14/18 0357  WBC 7.8  HGB 10.6*  HCT 35.2*  PLT 260   BMET Recent Labs    12/15/18 0359 12/16/18 0430  NA 135 137  K 4.0 4.0  CL 103 103  CO2 24 25  GLUCOSE 92 89  BUN 13 13  CREATININE 0.94 0.99  CALCIUM 8.7* 8.8*   PT/INR No results for input(s): LABPROT, INR in the last 72 hours. ABG No results for input(s): PHART, HCO3 in the last 72 hours.  Invalid input(s): PCO2, PO2  Studies/Results:  Anti-infectives: Anti-infectives (From admission, onward)   Start     Dose/Rate Route Frequency Ordered Stop   12/12/18 1400  neomycin (MYCIFRADIN) tablet 1,000 mg  Status:  Discontinued     1,000 mg Oral 3 times per day 12/12/18 0535 12/12/18 0550   12/12/18 1400  metroNIDAZOLE (FLAGYL) tablet 1,000 mg  Status:  Discontinued     1,000 mg Oral 3 times per day 12/12/18 0535 12/12/18 0550   12/12/18 0600  clindamycin (CLEOCIN) IVPB 900 mg     900 mg 100 mL/hr over 30 Minutes Intravenous 60 min pre-op 12/11/18 1132 12/12/18 0751   12/12/18 0600  gentamicin (GARAMYCIN) 430 mg in dextrose 5 % 100 mL IVPB     5 mg/kg  85.8 kg 110.8 mL/hr  over 60 Minutes Intravenous 60 min pre-op 12/11/18 1132 12/12/18 0806       Assessment/Plan: Patient Active Problem List   Diagnosis Date Noted  . Cancer of sigmoid colon ([place]) 12/12/2018  . Pleural effusion on right 07/15/2018  . Disease of lymph node   . Colon adenocarcinoma ([place]) 06/30/2018  . Acute GI bleeding 06/23/2018  . Acute blood loss anemia 06/23/2018  . Weakness generalized 06/23/2018  . Depression 05/06/2018  . Syncope 01/29/2018  . Chronic diastolic CHF (congestive heart failure) ([place]) 05/04/2017  . CAD (coronary artery disease) 05/03/2017  . MGUS (monoclonal gammopathy of unknown significance) 03/07/2016  . Benign essential HTN 01/19/2016  . OSA (obstructive sleep apnea) 08/29/2015  . PAF (paroxysmal atrial fibrillation) ([place])   . Glaucoma 06/22/2015  . DOE (dyspnea on exertion) 02/04/2015  . Memory loss, short term 08/03/2014  . Anemia, unspecified 12/28/2013  . Occlusion and stenosis of carotid artery without mention of cerebral infarction 07/09/2013  . COPD GOLD II  07/09/2013  . Chronic renal disease, stage 3, moderately decreased glomerular filtration rate between 30-59 mL/min/1.73 square meter (HCC) 07/06/2013  . TIA (transient ischemic attack) 07/06/2013  . Right carotid bruit 12/01/2012  . Basal cell carcinoma of skin 10/10/2012  . History of lung cancer 03/11/2012  . PAD (peripheral artery disease) ([place]) 12/16/2011  . AAA (abdominal  aortic aneurysm) ([place]) 09/27/2011  . Squamous cell lung cancer ([place]) 11/08/2010  . GAIT IMBALANCE 03/02/2009  . Prediabetes 03/02/2009  . COLONIC POLYPS, HX OF 03/02/2009  . OBSTRUCTIVE CHRONIC BRONCHITIS WITHOUT EXACERBAT 01/15/2008  . HYPERPLASIA PROSTATE UNS W/O UR OBST & OTH LUTS 01/15/2008  . HYPERLIPIDEMIA 09/29/2007  . Peripheral vascular disease ([place]) 09/29/2007  . LEUKOPLAKIA, VOCAL CORDS 09/29/2007  . FEMORAL BRUIT 09/29/2007   s/p Procedure(s): XI ROBOT  LOW ANTERIOR RESECTION  ERAS PATHWAY FLEXIBLE  SIGMOIDOSCOPY 12/12/2018  -Recovering well -Ambulate 5x/day -Soft diet -PPx: SQH, SCDs -Dispo: PT/OT; possible SNF as per wife's request  LOS: 3 days   Sharon Mt. Dema Severin, M.D. [facility]

## 2018-12-16 NOTE — Telephone Encounter (Signed)
Pts wife is aware and expressed understanding.

## 2018-12-16 NOTE — Progress Notes (Addendum)
Subjective No acute events. Feels well. Denies any complaints. Ambulating well with assistance with walker.  Objective: Vital signs in last 24 hours: Temp:  [97.8 F (36.6 C)-98.2 F (36.8 C)] 98 F (36.7 C) (03/17 0617) Pulse Rate:  [50-56] 51 (03/17 0617) Resp:  [16-17] 16 (03/17 0617) BP: (137-173)/(66-78) 173/78 (03/17 0617) SpO2:  [94 %-97 %] 94 % (03/17 0617) Weight:  [81.5 kg] 81.5 kg (03/17 0617) Last BM Date: 12/15/18  Intake/Output from previous day: 03/16 0701 - 03/17 0700 In: 930 [P.O.:930] Out: 1325 [Urine:1325] Intake/Output this shift: No intake/output data recorded.  Gen: NAD, comfortable CV: RRR Pulm: Normal work of breathing Abd: Soft, NT/ND; incisions c/d/i without erythema or drainage Ext: SCDs in place  Lab Results: CBC  Recent Labs    12/14/18 0357  WBC 7.8  HGB 10.6*  HCT 35.2*  PLT 260   BMET Recent Labs    12/15/18 0359 12/16/18 0430  NA 135 137  K 4.0 4.0  CL 103 103  CO2 24 25  GLUCOSE 92 89  BUN 13 13  CREATININE 0.94 0.99  CALCIUM 8.7* 8.8*   PT/INR No results for input(s): LABPROT, INR in the last 72 hours. ABG No results for input(s): PHART, HCO3 in the last 72 hours.  Invalid input(s): PCO2, PO2  Studies/Results:  Anti-infectives: Anti-infectives (From admission, onward)   Start     Dose/Rate Route Frequency Ordered Stop   12/12/18 1400  neomycin (MYCIFRADIN) tablet 1,000 mg  Status:  Discontinued     1,000 mg Oral 3 times per day 12/12/18 0535 12/12/18 0550   12/12/18 1400  metroNIDAZOLE (FLAGYL) tablet 1,000 mg  Status:  Discontinued     1,000 mg Oral 3 times per day 12/12/18 0535 12/12/18 0550   12/12/18 0600  clindamycin (CLEOCIN) IVPB 900 mg     900 mg 100 mL/hr over 30 Minutes Intravenous 60 min pre-op 12/11/18 1132 12/12/18 0751   12/12/18 0600  gentamicin (GARAMYCIN) 430 mg in dextrose 5 % 100 mL IVPB     5 mg/kg  85.8 kg 110.8 mL/hr over 60 Minutes Intravenous 60 min pre-op 12/11/18 1132 12/12/18 0806        Assessment/Plan: Patient Active Problem List   Diagnosis Date Noted  . Cancer of sigmoid colon ([place]) 12/12/2018  . Pleural effusion on right 07/15/2018  . Disease of lymph node   . Colon adenocarcinoma ([place]) 06/30/2018  . Acute GI bleeding 06/23/2018  . Acute blood loss anemia 06/23/2018  . Weakness generalized 06/23/2018  . Depression 05/06/2018  . Syncope 01/29/2018  . Chronic diastolic CHF (congestive heart failure) ([place]) 05/04/2017  . CAD (coronary artery disease) 05/03/2017  . MGUS (monoclonal gammopathy of unknown significance) 03/07/2016  . Benign essential HTN 01/19/2016  . OSA (obstructive sleep apnea) 08/29/2015  . PAF (paroxysmal atrial fibrillation) ([place])   . Glaucoma 06/22/2015  . DOE (dyspnea on exertion) 02/04/2015  . Memory loss, short term 08/03/2014  . Anemia, unspecified 12/28/2013  . Occlusion and stenosis of carotid artery without mention of cerebral infarction 07/09/2013  . COPD GOLD II  07/09/2013  . Chronic renal disease, stage 3, moderately decreased glomerular filtration rate between 30-59 mL/min/1.73 square meter (HCC) 07/06/2013  . TIA (transient ischemic attack) 07/06/2013  . Right carotid bruit 12/01/2012  . Basal cell carcinoma of skin 10/10/2012  . History of lung cancer 03/11/2012  . PAD (peripheral artery disease) ([place]) 12/16/2011  . AAA (abdominal aortic aneurysm) ([place]) 09/27/2011  . Squamous cell lung cancer ([place])  11/08/2010  . GAIT IMBALANCE 03/02/2009  . Prediabetes 03/02/2009  . COLONIC POLYPS, HX OF 03/02/2009  . OBSTRUCTIVE CHRONIC BRONCHITIS WITHOUT EXACERBAT 01/15/2008  . HYPERPLASIA PROSTATE UNS W/O UR OBST & OTH LUTS 01/15/2008  . HYPERLIPIDEMIA 09/29/2007  . Peripheral vascular disease ([place]) 09/29/2007  . LEUKOPLAKIA, VOCAL CORDS 09/29/2007  . FEMORAL BRUIT 09/29/2007   s/p Procedure(s): XI ROBOT  LOW ANTERIOR RESECTION  ERAS PATHWAY FLEXIBLE SIGMOIDOSCOPY 12/12/2018  -Soft diet -Ambulate 5x/day -PPx: SQH, SCDs,  PPI -Dispo: pending evaluation by PT/OT; his wife has requested SNF/rehab  LOS: 4 days   Sharon Mt. Dema Severin, M.D. [facility]

## 2018-12-16 NOTE — Progress Notes (Signed)
Occupational Therapy Treatment Patient Details Name: Troy Ostrom [name] MRN: [id number] DOB: Apr 21, 1941 Today's Date: 12/16/2018    History of present illness  78 yo former smoker (25 pack years, quit 07/28/14) with h/o PONV, HTN, GERD, PVD (s/p angioplasty and stenting 2016), stroke (2014), carotid artery stenosis (s/p right CEA 2014), OSA with CPAP, CKD, depression, anxiety, CHF, A-fib (on Eliquis), loop recorder in place, COPD, AAA (Abdominal US 10/20/17), BPH, HLD, h/o lung cancer (s/p right lower lobectomy 2012), sigmoid colon cancer, now s/p  robotic assisted low anterior resection scheduled with Dr. Nadeen Landau.   OT comments  PATIENT WAS COOPERATIVE WITH THERAPY. PATIENT WAS MOTIVATED TO WORK WITH OT TO MAXIMIZE I AND SAFETY WITH PERFORMING ADLS AND ADL TRANSFERS. PATIENT REQUIRED CUES FOR PROPER WALKER USAGE. PATIENT REQUIRES CUES FOR PROPER HAND PLACEMENT FOR SIT TO STAND AND STAND TO SIT. PATIENT WAS ABLE TO PERFORM GROOMING IN STANDING AT MIN GUARD ASSIST LEVEL FOR 5 MIN. PATIENT REQUIRES MIN GUARD ASSIST FOR STANDING FOR LE ADLS. PATIENT TO BE FOLLOWED FOR SKILLED OT.  Follow Up Recommendations       Equipment Recommendations       Recommendations for Other Services      Precautions / Restrictions Precautions Precautions: Fall Restrictions Weight Bearing Restrictions: No       Mobility Bed Mobility               General bed mobility comments: OOB in recliner   Transfers Overall transfer level: Needs assistance Equipment used: Rolling walker (2 wheeled) Transfers: Sit to/from [facility] Sit to Stand: Min guard Stand pivot transfers: Min guard       General transfer comment: cues to roll walker instead of picking it up    Balance                                           ADL either performed or assessed with clinical judgement   ADL       Grooming: Wash/dry hands;Wash/dry face;Oral care;Min guard;Standing               Lower Body Dressing: Min guard;Sit to/from stand(using cross leg technique)   Toilet Transfer: Min guard;Ambulation;Comfort height toilet;Grab bars           Functional mobility during ADLs: Min guard;Rolling walker General ADL Comments: Pt presenting with decreased activity tolerance due to pain and fatigue     Vision       Perception     Praxis      Cognition Arousal/Alertness: Awake/alert Behavior During Therapy: WFL for tasks assessed/performed Overall Cognitive Status: Within Functional Limits for tasks assessed                                          Exercises     Shoulder Instructions       General Comments      Pertinent Vitals/ Pain       Pain Assessment: 0-10 Pain Score: 2  Faces Pain Scale: Hurts a little bit Pain Location: lower ABD with sit to stand and stand to sit Pain Descriptors / Indicators: Sore;Tender Pain Intervention(s): Monitored during session  Home Living  Prior Functioning/Environment              Frequency           Progress Toward Goals  OT Goals(current goals can now be found in the care plan section)  Progress towards OT goals: Progressing toward goals  Acute Rehab OT Goals Patient Stated Goal: go home  Plan Discharge plan remains appropriate    Co-evaluation                 AM-PAC OT "6 Clicks" Daily Activity     Outcome Measure   Help from another person eating meals?: None Help from another person taking care of personal grooming?: A Little Help from another person toileting, which includes using toliet, bedpan, or urinal?: A Little Help from another person bathing (including washing, rinsing, drying)?: A Little Help from another person to put on and taking off regular upper body clothing?: None Help from another person to put on and taking off regular lower body clothing?: A Little 6 Click Score: 20    End  of Session Equipment Utilized During Treatment: Gait belt;Rolling walker      Activity Tolerance Patient tolerated treatment well   Patient Left in chair;with call bell/phone within reach   Nurse Communication (ok therapy)        Time: 4835-0757 OT Time Calculation (min): 38 min  Charges: OT General Charges $OT Visit: 1 Visit OT Treatments $Self Care/Home Management : 32-25 mins  6 CLICKS   Bram Hottel 12/16/2018, 3:13 PM

## 2018-12-16 NOTE — Telephone Encounter (Signed)
Copied from [place] [id number]. Topic: General - Other >> Dec 16, 2018 10:00 AM Leward Quan A wrote: Reason for CRM: Patient wife called to say that she is concerned about the fact that he is in the hospital and is having hallucinations of seeing little white puppies. She is concerned that he may be dehydrated because he takes 40 mg Lasix daily and she fears that he is not drinking enough. She also say that she is very concerned about how weak he is and say that he is not getting any IV fluids. Please advise... Ph# [phone]

## 2018-12-17 NOTE — TOC Initial Note (Signed)
Transition of Care (TOC) - Initial/Assessment Note    Patient Details  Name: Troy Davoli [name] MRN: [id number] Date of Birth: 12-11-1940  [facility]) CM/SW Contact:    Wende Neighbors, LCSW Phone Number: 12/17/2018, 1:41 PM  Clinical Narrative:   CSW met patient and spouse at bedside to discuss discharge plans. Patient stated he is agreeable to go to rehab. CSW gave patient a list of SNF that made a bed offer (pulled ratings off of the Medicare.gov site). Patient and spouse aware that due to the coronavirus that visitation to facilities are restricted. Patient stated he would like to go to [facility]  And agreeable with CSW starting authorization through [facility]. CSW continuing to follow for discharge needs             Expected Discharge Plan: Skilled Nursing Facility Barriers to Discharge: Insurance Authorization   Patient Goals and CMS Choice Patient states their goals for this hospitalization and ongoing recovery are:: to be able to go home  CMS Medicare.gov Compare Post Acute Care list provided to:: Patient Choice offered to / list presented to : Patient  Expected Discharge Plan and Services Expected Discharge Plan: [place] Choice: [place] arrangements for the past 2 months: Single Family Home                          Prior Living Arrangements/Services Living arrangements for the past 2 months: Single Family Home Lives with:: Spouse Patient language and need for interpreter reviewed:: Yes Do you feel safe going back to the place where you live?: Yes      Need for Family Participation in Patient Care: Yes (Comment) Care giver support system in place?: Yes (comment)   Criminal Activity/Legal Involvement Pertinent to Current Situation/Hospitalization: No - Comment as needed  Activities of Daily Living Home Assistive Devices/Equipment: Eyeglasses, CPAP, Blood pressure cuff, Grab bars in shower, Grab bars  around toilet ADL Screening (condition at time of admission) Patient's cognitive ability adequate to safely complete daily activities?: Yes Is the patient deaf or have difficulty hearing?: Yes Does the patient have difficulty seeing, even when wearing glasses/contacts?: Yes Does the patient have difficulty concentrating, remembering, or making decisions?: Yes Patient able to express need for assistance with ADLs?: Yes Does the patient have difficulty dressing or bathing?: No Independently performs ADLs?: Yes (appropriate for developmental age) Does the patient have difficulty walking or climbing stairs?: Yes(vision, sob) Weakness of Legs: Both Weakness of Arms/Hands: None  Permission Sought/Granted Permission sought to share information with : Family Supports    Share Information with NAME: Artem Bunte  Permission granted to share info w AGENCY: [facility]  Permission granted to share info w Relationship: spouse  Permission granted to share info w Contact Information: [phone]  Emotional Assessment Appearance:: Appears stated age Attitude/Demeanor/Rapport: Engaged Affect (typically observed): Accepting Orientation: : Oriented to Self, Oriented to  Time, Oriented to Place, Oriented to Situation Alcohol / Substance Use: Other (comment) Psych Involvement: No (comment)  Admission diagnosis:  sigmoid colon cancer Patient Active Problem List   Diagnosis Date Noted  . Cancer of sigmoid colon ([place]) 12/12/2018  . Pleural effusion on right 07/15/2018  . Disease of lymph node   . Colon adenocarcinoma ([place]) 06/30/2018  . Acute GI bleeding 06/23/2018  . Acute blood loss anemia 06/23/2018  . Weakness generalized 06/23/2018  . Depression 05/06/2018  . Syncope 01/29/2018  . Chronic  diastolic CHF (congestive heart failure) ([place]) 05/04/2017  . CAD (coronary artery disease) 05/03/2017  . MGUS (monoclonal gammopathy of unknown significance) 03/07/2016  . Benign essential HTN 01/19/2016   . OSA (obstructive sleep apnea) 08/29/2015  . PAF (paroxysmal atrial fibrillation) ([place])   . Glaucoma 06/22/2015  . DOE (dyspnea on exertion) 02/04/2015  . Memory loss, short term 08/03/2014  . Anemia, unspecified 12/28/2013  . Occlusion and stenosis of carotid artery without mention of cerebral infarction 07/09/2013  . COPD GOLD II  07/09/2013  . Chronic renal disease, stage 3, moderately decreased glomerular filtration rate between 30-59 mL/min/1.73 square meter (HCC) 07/06/2013  . TIA (transient ischemic attack) 07/06/2013  . Right carotid bruit 12/01/2012  . Basal cell carcinoma of skin 10/10/2012  . History of lung cancer 03/11/2012  . PAD (peripheral artery disease) ([place]) 12/16/2011  . AAA (abdominal aortic aneurysm) ([place]) 09/27/2011  . Squamous cell lung cancer ([place]) 11/08/2010  . GAIT IMBALANCE 03/02/2009  . Prediabetes 03/02/2009  . COLONIC POLYPS, HX OF 03/02/2009  . OBSTRUCTIVE CHRONIC BRONCHITIS WITHOUT EXACERBAT 01/15/2008  . HYPERPLASIA PROSTATE UNS W/O UR OBST & OTH LUTS 01/15/2008  . HYPERLIPIDEMIA 09/29/2007  . Peripheral vascular disease ([place]) 09/29/2007  . LEUKOPLAKIA, VOCAL CORDS 09/29/2007  . FEMORAL BRUIT 09/29/2007   PCP:  Binnie Rail, MD Pharmacy:   [name], [place] [address] Phone: [phone] Fax: [phone]     Social Determinants of Health (SDOH) Interventions    Readmission Risk Interventions 30 Day Unplanned Readmission Risk Score     Admission (Current) from 12/12/2018 in [facility] [place]  30 Day Unplanned Readmission Risk Score (%)  18 Filed at 12/17/2018 1200     This score is the patient's risk of an unplanned readmission within 30 days of being discharged (0 -100%). The score is based on dignosis, age, lab data, medications, orders, and past utilization.   Low:  0-14.9   Medium: 15-21.9   High: 22-29.9    Extreme: 30 and above       No flowsheet data found.

## 2018-12-17 NOTE — Progress Notes (Signed)
Occupational Therapy Treatment Patient Details Name: Troy Bennis [name] MRN: [id number] DOB: July 10, 1941 Today's Date: 12/17/2018    History of present illness  78 yo former smoker (25 pack years, quit 07/28/14) with h/o PONV, HTN, GERD, PVD (s/p angioplasty and stenting 2016), stroke (2014), carotid artery stenosis (s/p right CEA 2014), OSA with CPAP, CKD, depression, anxiety, CHF, A-fib (on Eliquis), loop recorder in place, COPD, AAA (Abdominal US 10/20/17), BPH, HLD, h/o lung cancer (s/p right lower lobectomy 2012), sigmoid colon cancer, now s/p  robotic assisted low anterior resection scheduled with Dr. Nadeen Landau.   OT comments  Pt overall S- min A with ADL activity .  Pt reports he has family 2 doors down and they have hired A in the past  Follow Up Recommendations  Home health OT;Supervision/Assistance - 24 hour    Equipment Recommendations  None recommended by OT    Recommendations for Other Services      Precautions / Restrictions Precautions Precautions: Fall       Mobility Bed Mobility Overal bed mobility: Needs Assistance Bed Mobility: Supine to Sit     Supine to sit: Supervision        Transfers Overall transfer level: Needs assistance Equipment used: Rolling walker (2 wheeled) Transfers: Sit to/from [facility] Sit to Stand: Supervision;Min guard Stand pivot transfers: Supervision;Min guard            Balance Overall balance assessment: Needs assistance Sitting-balance support: Feet supported Sitting balance-Leahy Scale: Normal     Standing balance support: Bilateral upper extremity supported;During functional activity Standing balance-Leahy Scale: Fair                             ADL either performed or assessed with clinical judgement   ADL Overall ADL's : Needs assistance/impaired Eating/Feeding: Set up;Sitting   Grooming: Set up;Standing;Cueing for safety                   Toilet Transfer:  Ambulation;Comfort height toilet;Grab bars;Supervision/safety   Toileting- Clothing Manipulation and Hygiene: Supervision/safety;Sit to/from stand;Cueing for sequencing;Cueing for safety       Functional mobility during ADLs: Min guard;Cueing for safety;Cueing for sequencing General ADL Comments: pt overall S- min A.  Pt would benefit from [name] OT to increase I with ADL activity.       Vision       Perception     Praxis      Cognition Arousal/Alertness: Awake/alert Behavior During Therapy: WFL for tasks assessed/performed Overall Cognitive Status: Within Functional Limits for tasks assessed                                                     Pertinent Vitals/ Pain       Pain Score: 2  Pain Location: lower ABD with sitting in chair Pain Descriptors / Indicators: Sore;Tender Pain Intervention(s): Limited activity within patient's tolerance;Repositioned      Progress Toward Goals  OT Goals(current goals can now be found in the care plan section)  Progress towards OT goals: Progressing toward goals     Plan Discharge plan remains appropriate       AM-PAC OT "6 Clicks" Daily Activity     Outcome Measure   Help from another person eating meals?: None Help from another person taking  care of personal grooming?: A Little Help from another person toileting, which includes using toliet, bedpan, or urinal?: A Little Help from another person bathing (including washing, rinsing, drying)?: A Little Help from another person to put on and taking off regular upper body clothing?: None Help from another person to put on and taking off regular lower body clothing?: A Little 6 Click Score: 20    End of Session Equipment Utilized During Treatment: Gait belt;Rolling walker  OT Visit Diagnosis: Unsteadiness on feet (R26.81);Other abnormalities of gait and mobility (R26.89);Muscle weakness (generalized) (M62.81);Pain   Activity Tolerance Patient tolerated treatment  well   Patient Left in chair;with call bell/phone within reach   Nurse Communication (ok therapy)        Time: 7510-2585 OT Time Calculation (min): 27 min  Charges: OT General Charges $OT Visit: 1 Visit OT Treatments $Self Care/Home Management : 23-37 mins  Kari Baars, [place] [phone] Office- [place], Edwena Felty D 12/17/2018, 4:12 PM

## 2018-12-17 NOTE — NC FL2 (Signed)
[place] LEVEL OF CARE SCREENING TOOL     IDENTIFICATION  Patient Name: Troy [name]. Birthdate: [birth year] Sex: male Admission Date (Current Location): 12/12/2018  [facility] and [place] Number:  Herbalist and Address:  [facility],  [place], [place]      Provider Number: [id number]  Attending Physician Name and Address:  Ileana Roup, MD  Relative Name and Phone Number:  Jaelen Soth, [phone]    Current Level of Care: Hospital Recommended Level of Care: [place] Prior Approval Number:    Date Approved/Denied:   PASRR Number: [id number]  Discharge Plan: SNF    Current Diagnoses: Patient Active Problem List   Diagnosis Date Noted  . Cancer of sigmoid colon ([place]) 12/12/2018  . Pleural effusion on right 07/15/2018  . Disease of lymph node   . Colon adenocarcinoma ([place]) 06/30/2018  . Acute GI bleeding 06/23/2018  . Acute blood loss anemia 06/23/2018  . Weakness generalized 06/23/2018  . Depression 05/06/2018  . Syncope 01/29/2018  . Chronic diastolic CHF (congestive heart failure) ([place]) 05/04/2017  . CAD (coronary artery disease) 05/03/2017  . MGUS (monoclonal gammopathy of unknown significance) 03/07/2016  . Benign essential HTN 01/19/2016  . OSA (obstructive sleep apnea) 08/29/2015  . PAF (paroxysmal atrial fibrillation) ([place])   . Glaucoma 06/22/2015  . DOE (dyspnea on exertion) 02/04/2015  . Memory loss, short term 08/03/2014  . Anemia, unspecified 12/28/2013  . Occlusion and stenosis of carotid artery without mention of cerebral infarction 07/09/2013  . COPD GOLD II  07/09/2013  . Chronic renal disease, stage 3, moderately decreased glomerular filtration rate between 30-59 mL/min/1.73 square meter (HCC) 07/06/2013  . TIA (transient ischemic attack) 07/06/2013  . Right carotid bruit 12/01/2012  . Basal cell carcinoma of skin 10/10/2012  . History of lung cancer 03/11/2012  .  PAD (peripheral artery disease) ([place]) 12/16/2011  . AAA (abdominal aortic aneurysm) ([place]) 09/27/2011  . Squamous cell lung cancer ([place]) 11/08/2010  . GAIT IMBALANCE 03/02/2009  . Prediabetes 03/02/2009  . COLONIC POLYPS, HX OF 03/02/2009  . OBSTRUCTIVE CHRONIC BRONCHITIS WITHOUT EXACERBAT 01/15/2008  . HYPERPLASIA PROSTATE UNS W/O UR OBST & OTH LUTS 01/15/2008  . HYPERLIPIDEMIA 09/29/2007  . Peripheral vascular disease ([place]) 09/29/2007  . LEUKOPLAKIA, VOCAL CORDS 09/29/2007  . FEMORAL BRUIT 09/29/2007    Orientation RESPIRATION BLADDER Height & Weight     Self, Situation, Place, Time  Normal Continent Weight: 179 lb 10.8 oz (81.5 kg) Height:  6\' 1"  (185.4 cm)  BEHAVIORAL SYMPTOMS/MOOD NEUROLOGICAL BOWEL NUTRITION STATUS      Continent Diet(diet soft)  AMBULATORY STATUS COMMUNICATION OF NEEDS Skin   Limited Assist Verbally                         Personal Care Assistance Level of Assistance  Feeding, Bathing, Dressing Bathing Assistance: Limited assistance Feeding assistance: Independent Dressing Assistance: Limited assistance     Functional Limitations Info  Sight, Hearing, Speech Sight Info: Adequate Hearing Info: Adequate Speech Info: Adequate    SPECIAL CARE FACTORS FREQUENCY  PT (By licensed PT), OT (By licensed OT)     PT Frequency: 5x wk OT Frequency: 5x wk            Contractures Contractures Info: Not present    Additional Factors Info  Code Status, Allergies Code Status Info: full code Allergies Info: NIACIN-LOVASTATIN ER, PENICILLINS, SULFONAMIDE DERIVATIVES, ATORVASTATIN  Current Medications (12/17/2018):  This is the current hospital active medication list Current Facility-Administered Medications  Medication Dose Route Frequency Provider Last Rate Last Dose  . acetaminophen (TYLENOL) tablet 1,000 mg  1,000 mg Oral Q6H Ileana Roup, MD   1,000 mg at 12/17/18 0540  . albuterol (PROVENTIL) (2.5 MG/3ML) 0.083% nebulizer  solution 3 mL  3 mL Inhalation Q6H PRN Ileana Roup, MD      . alum & mag hydroxide-simeth (MAALOX/MYLANTA) 200-200-20 MG/5ML suspension 30 mL  30 mL Oral Q6H PRN Ileana Roup, MD      . amLODipine (NORVASC) tablet 5 mg  5 mg Oral Daily Ileana Roup, MD   5 mg at 12/17/18 0950  . bimatoprost (LUMIGAN) 0.01 % ophthalmic solution 1 drop  1 drop Both Eyes QHS Ileana Roup, MD   1 drop at 12/16/18 2139  . diphenhydrAMINE (BENADRYL) 12.5 MG/5ML elixir 12.5 mg  12.5 mg Oral Q6H PRN Ileana Roup, MD       Or  . diphenhydrAMINE (BENADRYL) injection 12.5 mg  12.5 mg Intravenous Q6H PRN Ileana Roup, MD      . dorzolamide-timolol (COSOPT) 22.3-6.8 MG/ML ophthalmic solution 2 drop  2 drop Both Eyes BID Ileana Roup, MD   2 drop at 12/17/18 0953  . feeding supplement (ENSURE SURGERY) liquid 237 mL  237 mL Oral BID BM Ileana Roup, MD   237 mL at 12/17/18 0954  . ferrous sulfate tablet 325 mg  325 mg Oral QHS Ileana Roup, MD   325 mg at 12/16/18 2139  . FLUoxetine (PROZAC) capsule 40 mg  40 mg Oral Daily Ileana Roup, MD   40 mg at 12/17/18 0950  . furosemide (LASIX) tablet 40 mg  40 mg Oral Daily Ileana Roup, MD   40 mg at 12/17/18 0950  . heparin injection 5,000 Units  5,000 Units Subcutaneous Q8H Ileana Roup, MD   5,000 Units at 12/17/18 0540  . hydrALAZINE (APRESOLINE) injection 10 mg  10 mg Intravenous Q2H PRN Ileana Roup, MD      . HYDROmorphone (DILAUDID) injection 0.5 mg  0.5 mg Intravenous Q3H PRN Ileana Roup, MD      . ibuprofen (ADVIL,MOTRIN) tablet 600 mg  600 mg Oral Q6H PRN Ileana Roup, MD      . magnesium oxide (MAG-OX) tablet 400 mg  400 mg Oral Daily Ileana Roup, MD   400 mg at 12/17/18 0950  . methimazole (TAPAZOLE) tablet 10 mg  10 mg Oral Daily Ileana Roup, MD   10 mg at 12/17/18 0950  . methimazole (TAPAZOLE) tablet 5 mg  5 mg Oral QPM Ileana Roup, MD   5 mg at 12/16/18 1711  . metoprolol succinate (TOPROL-XL) 24 hr tablet 25 mg  25 mg Oral Daily Ileana Roup, MD   25 mg at 12/17/18 0949  . montelukast (SINGULAIR) tablet 10 mg  10 mg Oral QHS Ileana Roup, MD   10 mg at 12/16/18 2139  . ondansetron (ZOFRAN) tablet 4 mg  4 mg Oral Q6H PRN Ileana Roup, MD       Or  . ondansetron [facility]) injection 4 mg  4 mg Intravenous Q6H PRN Ileana Roup, MD      . pantoprazole (PROTONIX) EC tablet 40 mg  40 mg Oral Daily Ileana Roup, MD   40 mg at 12/17/18 0950  . Polyethyl Glycol-Propyl Glycol 0.4-0.3 %  SOLN 1 drop  1 drop Ophthalmic PRN Ileana Roup, MD      . potassium chloride SA (K-DUR,KLOR-CON) CR tablet 20 mEq  20 mEq Oral Daily Ileana Roup, MD   20 mEq at 12/17/18 0950  . rosuvastatin (CRESTOR) tablet 40 mg  40 mg Oral Daily Ileana Roup, MD   40 mg at 12/17/18 0950  . traMADol (ULTRAM) tablet 50 mg  50 mg Oral Q6H PRN Ileana Roup, MD         Discharge Medications: Please see discharge summary for a list of discharge medications.  Relevant Imaging Results:  Relevant Lab Results:   Additional Information SS# [id number]  Wende Neighbors, LCSW

## 2018-12-17 NOTE — Progress Notes (Addendum)
Subjective No acute events. Feels well. Denies any complaints. Ambulating well with assistance with walker. Passing gas, having bowel movements. Tolerating liquids and solids well. Ate tuna fish sandwich, ice cream, cornbread, collard greens, spaghetti with meatballs, plate of cake, 2 cans of sprite, glass of tea, 2 cups of water, 1 can of gingerale   Objective: Vital signs in last 24 hours: Temp:  [97.8 F (36.6 C)-98.2 F (36.8 C)] 98.2 F (36.8 C) (03/18 0534) Pulse Rate:  [56-61] 61 (03/18 0534) Resp:  [16-17] 16 (03/18 0534) BP: (122-165)/(67-76) 165/74 (03/18 0534) SpO2:  [95 %-97 %] 96 % (03/18 0534) Last BM Date: 12/16/18  Intake/Output from previous day: 03/17 0701 - 03/18 0700 In: 840 [P.O.:540; I.V.:300] Out: 700 [Urine:700] Intake/Output this shift: No intake/output data recorded.  Gen: NAD, comfortable CV: RRR Pulm: Normal work of breathing Abd: Soft, NT/ND; incisions c/d/i without erythema or drainage Ext: SCDs in place  Lab Results: CBC  No results for input(s): WBC, HGB, HCT, PLT in the last 72 hours. BMET Recent Labs    12/15/18 0359 12/16/18 0430  NA 135 137  K 4.0 4.0  CL 103 103  CO2 24 25  GLUCOSE 92 89  BUN 13 13  CREATININE 0.94 0.99  CALCIUM 8.7* 8.8*   PT/INR No results for input(s): LABPROT, INR in the last 72 hours. ABG No results for input(s): PHART, HCO3 in the last 72 hours.  Invalid input(s): PCO2, PO2  Studies/Results:  Anti-infectives: Anti-infectives (From admission, onward)   Start     Dose/Rate Route Frequency Ordered Stop   12/12/18 1400  neomycin (MYCIFRADIN) tablet 1,000 mg  Status:  Discontinued     1,000 mg Oral 3 times per day 12/12/18 0535 12/12/18 0550   12/12/18 1400  metroNIDAZOLE (FLAGYL) tablet 1,000 mg  Status:  Discontinued     1,000 mg Oral 3 times per day 12/12/18 0535 12/12/18 0550   12/12/18 0600  clindamycin (CLEOCIN) IVPB 900 mg     900 mg 100 mL/hr over 30 Minutes Intravenous 60 min pre-op  12/11/18 1132 12/12/18 0751   12/12/18 0600  gentamicin (GARAMYCIN) 430 mg in dextrose 5 % 100 mL IVPB     5 mg/kg  85.8 kg 110.8 mL/hr over 60 Minutes Intravenous 60 min pre-op 12/11/18 1132 12/12/18 0806       Assessment/Plan: Patient Active Problem List   Diagnosis Date Noted  . Cancer of sigmoid colon ([place]) 12/12/2018  . Pleural effusion on right 07/15/2018  . Disease of lymph node   . Colon adenocarcinoma ([place]) 06/30/2018  . Acute GI bleeding 06/23/2018  . Acute blood loss anemia 06/23/2018  . Weakness generalized 06/23/2018  . Depression 05/06/2018  . Syncope 01/29/2018  . Chronic diastolic CHF (congestive heart failure) ([place]) 05/04/2017  . CAD (coronary artery disease) 05/03/2017  . MGUS (monoclonal gammopathy of unknown significance) 03/07/2016  . Benign essential HTN 01/19/2016  . OSA (obstructive sleep apnea) 08/29/2015  . PAF (paroxysmal atrial fibrillation) ([place])   . Glaucoma 06/22/2015  . DOE (dyspnea on exertion) 02/04/2015  . Memory loss, short term 08/03/2014  . Anemia, unspecified 12/28/2013  . Occlusion and stenosis of carotid artery without mention of cerebral infarction 07/09/2013  . COPD GOLD II  07/09/2013  . Chronic renal disease, stage 3, moderately decreased glomerular filtration rate between 30-59 mL/min/1.73 square meter (HCC) 07/06/2013  . TIA (transient ischemic attack) 07/06/2013  . Right carotid bruit 12/01/2012  . Basal cell carcinoma of skin 10/10/2012  . History of  lung cancer 03/11/2012  . PAD (peripheral artery disease) ([name]) 12/16/2011  . AAA (abdominal aortic aneurysm) ([place]) 09/27/2011  . Squamous cell lung cancer ([place]) 11/08/2010  . GAIT IMBALANCE 03/02/2009  . Prediabetes 03/02/2009  . COLONIC POLYPS, HX OF 03/02/2009  . OBSTRUCTIVE CHRONIC BRONCHITIS WITHOUT EXACERBAT 01/15/2008  . HYPERPLASIA PROSTATE UNS W/O UR OBST & OTH LUTS 01/15/2008  . HYPERLIPIDEMIA 09/29/2007  . Peripheral vascular disease ([place]) 09/29/2007  .  LEUKOPLAKIA, VOCAL CORDS 09/29/2007  . FEMORAL BRUIT 09/29/2007   s/p Procedure(s): XI ROBOT  LOW ANTERIOR RESECTION  ERAS PATHWAY FLEXIBLE SIGMOIDOSCOPY 12/12/2018  -Soft diet -Ambulate 5x/day -PPx: SQH, SCDs, PPI -Dispo: pending evaluation by PT/OT; his wife has requested SNF/rehab -awaiting this to be established; otherwise appears medically cleared for discharge  LOS: 5 days   Sharon Mt. Dema Severin, M.D. [facility]

## 2018-12-18 NOTE — Progress Notes (Signed)
Subjective No acute events. Feels well. Denies any complaints. Passing gas, having bowel movements. Tolerating diet well. Pain well controlled  Objective: Vital signs in last 24 hours: Temp:  [97.9 F (36.6 C)-98.9 F (37.2 C)] 98.9 F (37.2 C) (03/19 0600) Pulse Rate:  [56-60] 60 (03/19 0600) Resp:  [14-18] 14 (03/19 0600) BP: (143-165)/(69-76) 143/69 (03/19 0600) SpO2:  [96 %-99 %] 96 % (03/19 0600) Last BM Date: 12/17/18  Intake/Output from previous day: 03/18 0701 - 03/19 0700 In: 1200 [P.O.:1200] Out: 450 [Urine:450] Intake/Output this shift: No intake/output data recorded.  Gen: NAD, comfortable CV: RRR Pulm: Normal work of breathing Abd: Soft, NT/ND; incisions c/d/i without erythema or drainage Ext: SCDs in place  Lab Results: CBC  No results for input(s): WBC, HGB, HCT, PLT in the last 72 hours. BMET Recent Labs    12/16/18 0430  NA 137  K 4.0  CL 103  CO2 25  GLUCOSE 89  BUN 13  CREATININE 0.99  CALCIUM 8.8*   PT/INR No results for input(s): LABPROT, INR in the last 72 hours. ABG No results for input(s): PHART, HCO3 in the last 72 hours.  Invalid input(s): PCO2, PO2  Studies/Results:  Anti-infectives: Anti-infectives (From admission, onward)   Start     Dose/Rate Route Frequency Ordered Stop   12/12/18 1400  neomycin (MYCIFRADIN) tablet 1,000 mg  Status:  Discontinued     1,000 mg Oral 3 times per day 12/12/18 0535 12/12/18 0550   12/12/18 1400  metroNIDAZOLE (FLAGYL) tablet 1,000 mg  Status:  Discontinued     1,000 mg Oral 3 times per day 12/12/18 0535 12/12/18 0550   12/12/18 0600  clindamycin (CLEOCIN) IVPB 900 mg     900 mg 100 mL/hr over 30 Minutes Intravenous 60 min pre-op 12/11/18 1132 12/12/18 0751   12/12/18 0600  gentamicin (GARAMYCIN) 430 mg in dextrose 5 % 100 mL IVPB     5 mg/kg  85.8 kg 110.8 mL/hr over 60 Minutes Intravenous 60 min pre-op 12/11/18 1132 12/12/18 0806       Assessment/Plan: Patient Active Problem List   Diagnosis Date Noted  . Cancer of sigmoid colon ([place]) 12/12/2018  . Pleural effusion on right 07/15/2018  . Disease of lymph node   . Colon adenocarcinoma ([place]) 06/30/2018  . Acute GI bleeding 06/23/2018  . Acute blood loss anemia 06/23/2018  . Weakness generalized 06/23/2018  . Depression 05/06/2018  . Syncope 01/29/2018  . Chronic diastolic CHF (congestive heart failure) ([place]) 05/04/2017  . CAD (coronary artery disease) 05/03/2017  . MGUS (monoclonal gammopathy of unknown significance) 03/07/2016  . Benign essential HTN 01/19/2016  . OSA (obstructive sleep apnea) 08/29/2015  . PAF (paroxysmal atrial fibrillation) ([place])   . Glaucoma 06/22/2015  . DOE (dyspnea on exertion) 02/04/2015  . Memory loss, short term 08/03/2014  . Anemia, unspecified 12/28/2013  . Occlusion and stenosis of carotid artery without mention of cerebral infarction 07/09/2013  . COPD GOLD II  07/09/2013  . Chronic renal disease, stage 3, moderately decreased glomerular filtration rate between 30-59 mL/min/1.73 square meter (HCC) 07/06/2013  . TIA (transient ischemic attack) 07/06/2013  . Right carotid bruit 12/01/2012  . Basal cell carcinoma of skin 10/10/2012  . History of lung cancer 03/11/2012  . PAD (peripheral artery disease) ([place]) 12/16/2011  . AAA (abdominal aortic aneurysm) ([place]) 09/27/2011  . Squamous cell lung cancer ([place]) 11/08/2010  . GAIT IMBALANCE 03/02/2009  . Prediabetes 03/02/2009  . COLONIC POLYPS, HX OF 03/02/2009  . OBSTRUCTIVE CHRONIC BRONCHITIS  WITHOUT EXACERBAT 01/15/2008  . HYPERPLASIA PROSTATE UNS W/O UR OBST & OTH LUTS 01/15/2008  . HYPERLIPIDEMIA 09/29/2007  . Peripheral vascular disease ([place]) 09/29/2007  . LEUKOPLAKIA, VOCAL CORDS 09/29/2007  . FEMORAL BRUIT 09/29/2007   s/p Procedure(s): XI ROBOT  LOW ANTERIOR RESECTION  ERAS PATHWAY FLEXIBLE SIGMOIDOSCOPY 12/12/2018  -Soft diet -Ambulate 5x/day -PPx: SQH, SCDs, PPI -Dispo: pending SNF/rehab acceptance -awaiting this to  be established; otherwise appears medically cleared for discharge    LOS: 6 days   Sharon Mt. Dema Severin, M.D. [facility]

## 2018-12-18 NOTE — Progress Notes (Addendum)
Occupational Therapy Treatment Patient Details Name: Troy Collington [name] MRN: [id number] DOB: December 07, 1940 Today's Date: 12/18/2018    History of present illness  78 yo former smoker (25 pack years, quit 07/28/14) with h/o PONV, HTN, GERD, PVD (s/p angioplasty and stenting 2016), stroke (2014), carotid artery stenosis (s/p right CEA 2014), OSA with CPAP, CKD, depression, anxiety, CHF, A-fib (on Eliquis), loop recorder in place, COPD, AAA (Abdominal US 10/20/17), BPH, HLD, h/o lung cancer (s/p right lower lobectomy 2012), sigmoid colon cancer, now s/p  robotic assisted low anterior resection scheduled with Dr. Nadeen Landau.   OT comments  Pts wife wants him to go to SNF but pt wants to go home.  Pt is overall S and states he can hire any A that he needs.  Follow Up Recommendations  Home health OT;Supervision/Assistance - 24 hour    Equipment Recommendations  None recommended by OT    Recommendations for Other Services      Precautions / Restrictions Precautions Precautions: Fall Restrictions Weight Bearing Restrictions: No       Mobility Bed Mobility Overal bed mobility: Modified Independent             General bed mobility comments: pt self able with increased time   Transfers Overall transfer level: Needs assistance Equipment used: Rolling walker (2 wheeled) Transfers: Sit to/from [facility] Sit to Stand: Supervision Stand pivot transfers: Supervision       General transfer comment: pt self able with good use of hands to steady self    Balance Overall balance assessment: Needs assistance Sitting-balance support: Feet supported Sitting balance-Leahy Scale: Normal     Standing balance support: Bilateral upper extremity supported;During functional activity Standing balance-Leahy Scale: Fair                             ADL either performed or assessed with clinical judgement   ADL Overall ADL's : Needs assistance/impaired                      Lower Body Dressing: Supervision/safety;Sit to/from stand;Cueing for sequencing;Cueing for safety   Toilet Transfer: Supervision/safety;Comfort height toilet;RW   Toileting- Clothing Manipulation and Hygiene: Supervision/safety;Sit to/from stand;Cueing for sequencing;Cueing for safety       Functional mobility during ADLs: Supervision/safety;Rolling walker;Cueing for safety;Cueing for sequencing       Vision Baseline Vision/History: Wears glasses Wears Glasses: Reading only;At all times Patient Visual Report: No change from baseline            Cognition Arousal/Alertness: Awake/alert Behavior During Therapy: WFL for tasks assessed/performed Overall Cognitive Status: Within Functional Limits for tasks assessed                                 General Comments: pleasant                   Pertinent Vitals/ Pain       Pain Assessment: No/denies pain            Progress Toward Goals  OT Goals(current goals can now be found in the care plan section)  Progress towards OT goals: Progressing toward goals     Plan Discharge plan remains appropriate       AM-PAC OT "6 Clicks" Daily Activity     Outcome Measure   Help from another person eating meals?: None Help from another  person taking care of personal grooming?: None Help from another person toileting, which includes using toliet, bedpan, or urinal?: None Help from another person bathing (including washing, rinsing, drying)?: A Little Help from another person to put on and taking off regular upper body clothing?: None Help from another person to put on and taking off regular lower body clothing?: None 6 Click Score: 23    End of Session Equipment Utilized During Treatment: Gait belt;Rolling walker  OT Visit Diagnosis: Unsteadiness on feet (R26.81);Other abnormalities of gait and mobility (R26.89);Muscle weakness (generalized) (M62.81);Pain   Activity Tolerance Patient  tolerated treatment well   Patient Left in chair;with call bell/phone within reach   Nurse Communication (ok therapy)        Time: 6837-2902 OT Time Calculation (min): 27 min  Charges: OT General Charges $OT Visit: 1 Visit OT Treatments $Self Care/Home Management : 23-37 mins  Kari Baars, [place] [phone] Office- [place], Edwena Felty D 12/18/2018, 1:26 PM

## 2018-12-18 NOTE — Progress Notes (Signed)
Physical Therapy Treatment Patient Details Name: Troy [name]. MRN: [id number] DOB: [birth year] Today's Date: 12/18/2018    History of Present Illness  78 yo former smoker (25 pack years, quit 07/28/14) with h/o PONV, HTN, GERD, PVD (s/p angioplasty and stenting 2016), stroke (2014), carotid artery stenosis (s/p right CEA 2014), OSA with CPAP, CKD, depression, anxiety, CHF, A-fib (on Eliquis), loop recorder in place, COPD, AAA (Abdominal US 10/20/17), BPH, HLD, h/o lung cancer (s/p right lower lobectomy 2012), sigmoid colon cancer, now s/p  robotic assisted low anterior resection scheduled with Dr. Nadeen Landau.    PT Comments    Pt progressing well with his mobility.  General bed mobility comments: pt self able with increased time.  General transfer comment: pt self able with good use of hands to steady self.  Pt amb 225 feet General Gait Details: tolerated an increased distance and present with increased balance/stability.  Light lean on walker and self able to navigate around obsticles safely.  Also present with good safey cognition.  Pt does have low vision(glaucoma).  No LOB.  No dyspnea.   Consulted LPT pt's progress and may no longer require SNF.  Spouse wants to continue to pursue ST Rehab at SNF and pt wants to go home.    Follow Up Recommendations  Home health PT     Equipment Recommendations  None recommended by PT    Recommendations for Other Services       Precautions / Restrictions Precautions Precautions: Fall Restrictions Weight Bearing Restrictions: No    Mobility  Bed Mobility Overal bed mobility: Modified Independent             General bed mobility comments: pt self able with increased time   Transfers Overall transfer level: Needs assistance Equipment used: Rolling walker (2 wheeled) Transfers: Sit to/from [facility] Sit to Stand: Supervision Stand pivot transfers: Supervision       General transfer comment: pt self able with  good use of hands to steady self  Ambulation/Gait Ambulation/Gait assistance: Supervision Gait Distance (Feet): 225 Feet Assistive device: Rolling walker (2 wheeled);None Gait Pattern/deviations: Step-through pattern Gait velocity: WFL   General Gait Details: tolerated an increased distance and present with increased balance/stability.  Light lean on walker and self able to navigate around obsticles safely.  Also present with good safey cognition.  Pt does have low vision(glaucoma).  No LOB.  No dyspnea.     Stairs             Wheelchair Mobility    Modified Rankin (Stroke Patients Only)       Balance                                            Cognition Arousal/Alertness: Awake/alert Behavior During Therapy: WFL for tasks assessed/performed Overall Cognitive Status: Within Functional Limits for tasks assessed                                 General Comments: pleasant      Exercises      General Comments        Pertinent Vitals/Pain Pain Assessment: No/denies pain    Home Living                      Prior Function  PT Goals (current goals can now be found in the care plan section) Progress towards PT goals: Progressing toward goals    Frequency    Min 3X/week      PT Plan Current plan remains appropriate    Co-evaluation              AM-PAC PT "6 Clicks" Mobility   Outcome Measure  Help needed turning from your back to your side while in a flat bed without using bedrails?: None Help needed moving from lying on your back to sitting on the side of a flat bed without using bedrails?: None Help needed moving to and from a bed to a chair (including a wheelchair)?: A Little Help needed standing up from a chair using your arms (e.g., wheelchair or bedside chair)?: A Little Help needed to walk in hospital room?: A Little Help needed climbing 3-5 steps with a railing? : A Little 6 Click  Score: 20    End of Session Equipment Utilized During Treatment: Gait belt Activity Tolerance: Patient tolerated treatment well Patient left: in chair;with call bell/phone within reach Nurse Communication: Mobility status       Time: 1020-1035 PT Time Calculation (min) (ACUTE ONLY): 15 min  Charges:  $Gait Training: 8-22 mins                     Rica Koyanagi  PTA Acute  Rehabilitation Services Pager      [phone] Office      [phone]

## 2018-12-18 NOTE — TOC Progression Note (Addendum)
Transition of Care (TOC) - Progression Note    Patient Details  Name: Troy Magnone [name] MRN: [id number] Date of Birth: [birth year]  Transition of Care [facility]) CM/SW [place], [place] Phone Number: 12/18/2018, 12:49 PM  Clinical Narrative:  CSW following patient for support and discharge needs. CSW met patient, spouse and OT at bedside. OT Cecille Rubin spoke with family stating that she feels patient would be able to discharge home with home health. After a long conversation patients spouse stated she still wants patient to go to rehab. Cecille Rubin stated that she feels patient would do better at home. Spouse stated she can not take him home because she does not have help. Patient stayed quit during this time and allowed spouse to make discharge plans.  CSW made family aware that patient is waiting for authorization through insurance and CSW will reach back out to family once Josem Kaufmann has been approved or denied.  Patient and spouse requested that CSW long into [facility] since they have paid into the facility. CSW reached out to [facility] and they stated that they will not be able to take patient due to the virus spreading. Facility stated that if anything changes they will reach back out to CSW to make her aware of change      Expected Discharge Plan: [place] Barriers to Discharge: [employer]  Expected Discharge Plan and Services Expected Discharge Plan: [place] Choice: [name] arrangements for the past 2 months: Single Family Home                           Social Determinants of Health (SDOH) Interventions    Readmission Risk Interventions No flowsheet data found.

## 2018-12-19 ENCOUNTER — Telehealth: Payer: Self-pay | Admitting: Nurse Practitioner

## 2018-12-19 NOTE — Progress Notes (Signed)
Occupational Therapy Treatment Patient Details Name: Troy Trickey [name] MRN: [id number] DOB: 1941-04-09 Today's Date: 12/19/2018    History of present illness  78 yo former smoker (25 pack years, quit 07/28/14) with h/o PONV, HTN, GERD, PVD (s/p angioplasty and stenting 2016), stroke (2014), carotid artery stenosis (s/p right CEA 2014), OSA with CPAP, CKD, depression, anxiety, CHF, A-fib (on Eliquis), loop recorder in place, COPD, AAA (Abdominal US 10/20/17), BPH, HLD, h/o lung cancer (s/p right lower lobectomy 2012), sigmoid colon cancer, now s/p  robotic assisted low anterior resection scheduled with Dr. Nadeen Landau.   OT comments  Pt relieved he is able to go home and not to SNF  Follow Up Recommendations  Home health OT;Supervision/Assistance - 24 hour    Equipment Recommendations  None recommended by OT    Recommendations for Other Services      Precautions / Restrictions Precautions Precautions: Fall       Mobility Bed Mobility Overal bed mobility: Modified Independent                Transfers Overall transfer level: Needs assistance Equipment used: Rolling walker (2 wheeled) Transfers: Sit to/from [facility] Sit to Stand: Supervision Stand pivot transfers: Supervision       General transfer comment: pt self able with good use of hands to steady self    Balance Overall balance assessment: Needs assistance Sitting-balance support: Feet supported Sitting balance-Leahy Scale: Normal     Standing balance support: Bilateral upper extremity supported;During functional activity Standing balance-Leahy Scale: Fair                             ADL either performed or assessed with clinical judgement   ADL Overall ADL's : Needs assistance/impaired     Grooming: Set up;Standing;Cueing for safety   Upper Body Bathing: Set up;Sitting   Lower Body Bathing: Min guard;Sit to/from stand;Cueing for safety   Upper Body Dressing : Set  up;Sitting   Lower Body Dressing: Min guard;Sit to/from stand;Cueing for safety   Toilet Transfer: Supervision/safety;RW;Ambulation           Functional mobility during ADLs: Supervision/safety;Rolling walker;Cueing for safety;Cueing for sequencing General ADL Comments: pt overall S- min A.  Pt would benefit from [facility] OT to increase I with ADL activity.  Pt stood at sink and performed bird bath with OT this day. Pt is relieved he is going home     Vision Baseline Vision/History: Wears glasses Wears Glasses: Reading only;At all times Patient Visual Report: No change from baseline     Perception     Praxis      Cognition Arousal/Alertness: Awake/alert Behavior During Therapy: WFL for tasks assessed/performed Overall Cognitive Status: Within Functional Limits for tasks assessed                                            Progress Toward Goals  OT Goals(current goals can now be found in the care plan section)  Progress towards OT goals: Progressing toward goals     Plan Discharge plan remains appropriate       AM-PAC OT "6 Clicks" Daily Activity     Outcome Measure   Help from another person eating meals?: None Help from another person taking care of personal grooming?: None Help from another person toileting, which includes using toliet, bedpan, or  urinal?: None Help from another person bathing (including washing, rinsing, drying)?: A Little Help from another person to put on and taking off regular upper body clothing?: None Help from another person to put on and taking off regular lower body clothing?: None 6 Click Score: 23    End of Session Equipment Utilized During Treatment: Gait belt;Rolling walker  OT Visit Diagnosis: Unsteadiness on feet (R26.81);Other abnormalities of gait and mobility (R26.89);Muscle weakness (generalized) (M62.81);Pain   Activity Tolerance Patient tolerated treatment well   Patient Left in chair;with call bell/phone  within reach   Nurse Communication (ok therapy)        Time: 0149-9692 OT Time Calculation (min): 22 min  Charges: OT General Charges $OT Visit: 1 Visit OT Treatments $Self Care/Home Management : 8-22 mins  Kari Baars, [place] [phone] Office- [phone]      [place], Edwena Felty D 12/19/2018, 3:18 PM

## 2018-12-19 NOTE — Progress Notes (Signed)
Subjective No acute events. Feels well. Denies any complaints. Passing gas, having bowel movements. Tolerating diet well. Pain well controlled  Objective: Vital signs in last 24 hours: Temp:  [97.5 F (36.4 C)-98.4 F (36.9 C)] 97.5 F (36.4 C) (03/20 0522) Pulse Rate:  [56-59] 58 (03/20 0522) Resp:  [18] 18 (03/20 0522) BP: (123-157)/(64-74) 157/72 (03/20 0522) SpO2:  [97 %-98 %] 98 % (03/20 0522) Weight:  [83.4 kg] 83.4 kg (03/20 0522) Last BM Date: 12/18/18  Intake/Output from previous day: 03/19 0701 - 03/20 0700 In: 840 [P.O.:840] Out: 1375 [Urine:1375] Intake/Output this shift: No intake/output data recorded.  Gen: NAD, comfortable CV: RRR Pulm: Normal work of breathing Abd: Soft, NT/ND; incisions c/d/i without erythema or drainage Ext: SCDs in place  Lab Results: CBC  No results for input(s): WBC, HGB, HCT, PLT in the last 72 hours. BMET No results for input(s): NA, K, CL, CO2, GLUCOSE, BUN, CREATININE, CALCIUM in the last 72 hours. PT/INR No results for input(s): LABPROT, INR in the last 72 hours. ABG No results for input(s): PHART, HCO3 in the last 72 hours.  Invalid input(s): PCO2, PO2  Studies/Results:  Anti-infectives: Anti-infectives (From admission, onward)   Start     Dose/Rate Route Frequency Ordered Stop   12/12/18 1400  neomycin (MYCIFRADIN) tablet 1,000 mg  Status:  Discontinued     1,000 mg Oral 3 times per day 12/12/18 0535 12/12/18 0550   12/12/18 1400  metroNIDAZOLE (FLAGYL) tablet 1,000 mg  Status:  Discontinued     1,000 mg Oral 3 times per day 12/12/18 0535 12/12/18 0550   12/12/18 0600  clindamycin (CLEOCIN) IVPB 900 mg     900 mg 100 mL/hr over 30 Minutes Intravenous 60 min pre-op 12/11/18 1132 12/12/18 0751   12/12/18 0600  gentamicin (GARAMYCIN) 430 mg in dextrose 5 % 100 mL IVPB     5 mg/kg  85.8 kg 110.8 mL/hr over 60 Minutes Intravenous 60 min pre-op 12/11/18 1132 12/12/18 0806       Assessment/Plan: Patient Active  Problem List   Diagnosis Date Noted  . Cancer of sigmoid colon ([place]) 12/12/2018  . Pleural effusion on right 07/15/2018  . Disease of lymph node   . Colon adenocarcinoma ([place]) 06/30/2018  . Acute GI bleeding 06/23/2018  . Acute blood loss anemia 06/23/2018  . Weakness generalized 06/23/2018  . Depression 05/06/2018  . Syncope 01/29/2018  . Chronic diastolic CHF (congestive heart failure) ([place]) 05/04/2017  . CAD (coronary artery disease) 05/03/2017  . MGUS (monoclonal gammopathy of unknown significance) 03/07/2016  . Benign essential HTN 01/19/2016  . OSA (obstructive sleep apnea) 08/29/2015  . PAF (paroxysmal atrial fibrillation) ([place])   . Glaucoma 06/22/2015  . DOE (dyspnea on exertion) 02/04/2015  . Memory loss, short term 08/03/2014  . Anemia, unspecified 12/28/2013  . Occlusion and stenosis of carotid artery without mention of cerebral infarction 07/09/2013  . COPD GOLD II  07/09/2013  . Chronic renal disease, stage 3, moderately decreased glomerular filtration rate between 30-59 mL/min/1.73 square meter (HCC) 07/06/2013  . TIA (transient ischemic attack) 07/06/2013  . Right carotid bruit 12/01/2012  . Basal cell carcinoma of skin 10/10/2012  . History of lung cancer 03/11/2012  . PAD (peripheral artery disease) ([place]) 12/16/2011  . AAA (abdominal aortic aneurysm) ([place]) 09/27/2011  . Squamous cell lung cancer ([place]) 11/08/2010  . GAIT IMBALANCE 03/02/2009  . Prediabetes 03/02/2009  . COLONIC POLYPS, HX OF 03/02/2009  . OBSTRUCTIVE CHRONIC BRONCHITIS WITHOUT EXACERBAT 01/15/2008  . HYPERPLASIA PROSTATE  UNS W/O UR OBST & OTH LUTS 01/15/2008  . HYPERLIPIDEMIA 09/29/2007  . Peripheral vascular disease ([place]) 09/29/2007  . LEUKOPLAKIA, VOCAL CORDS 09/29/2007  . FEMORAL BRUIT 09/29/2007   s/p Procedure(s): XI ROBOT  LOW ANTERIOR RESECTION  ERAS PATHWAY FLEXIBLE SIGMOIDOSCOPY 12/12/2018  -Doing well. Remains in house for complicated disposition -Ambulate 5x/day -PPx: SQH,  SCDs, PPI -Dispo: pending SNF/rehab acceptance? -awaiting this to be established; otherwise appears medically cleared for discharge    LOS: 7 days   [place] Dema Severin, M.D. [facility]

## 2018-12-19 NOTE — TOC Transition Note (Signed)
Transition of Care [facility]) - CM/SW Discharge Note   Patient Details  Name: Troy Councilman [name] MRN: [id number] Date of Birth: 11-May-1941  Transition of Care [facility]) CM/SW Contact:  Dessa Phi, RN Phone Number: 12/19/2018, 11:22 AM   Clinical Narrative:  CM has left message for return call from Dr. Burnard Bunting to [place] about patient's recc per PT/OT-HHPT/OT. CM recc HHRN/PT/OT/aide/csw-await call back from Dr. Dema Severin for [facility] orders if in agreement.[facility] already following.       Barriers to Discharge: Insurance Authorization   Patient Goals and CMS Choice Patient states their goals for this hospitalization and ongoing recovery are:: to be able to go home  CMS Medicare.gov Compare Post Acute Care list provided to:: Patient Choice offered to / list presented to : Patient  Discharge Placement                       Discharge Plan and Services     Post Acute Care Choice: [place]                    Social Determinants of Health (SDOH) Interventions     Readmission Risk Interventions No flowsheet data found.

## 2018-12-19 NOTE — TOC Transition Note (Signed)
Transition of Care [facility]) - CM/SW Discharge Note   Patient Details  Name: Troy Yeatts [name] MRN: [id number] Date of Birth: [birth year]  Transition of Care [facility]) CM/SW Contact:  Dessa Phi, RN Phone Number: 12/19/2018, 11:47 AM   Clinical Narrative: Spoke to D. White-in agreement to [place] orders, & face to [facility] chosen for [employer] already following. Patient's address:[address]. No further CM needs.        Barriers to Discharge: Insurance Authorization   Patient Goals and CMS Choice Patient states their goals for this hospitalization and ongoing recovery are:: to be able to go home  CMS Medicare.gov Compare Post Acute Care list provided to:: Patient Choice offered to / list presented to : Patient  Discharge Placement                       Discharge Plan and Services d/c home w/HHC-AHH-HHRN/OT/aide/CSW/PT     Post Acute Care Choice: [place]                    Social Determinants of Health (SDOH) Interventions     Readmission Risk Interventions No flowsheet data found.

## 2018-12-19 NOTE — Progress Notes (Signed)
Pt was discharged home today. Instructions were reviewed with patient, and questions were answered. Pt was taken to main entrance via wheelchair by NT.  

## 2018-12-19 NOTE — Telephone Encounter (Signed)
Called patietn per 3/16 VM scheduling log.  Patient wife wanted to schedule her husband appt for a f/u due to her saying the MD wanted to see him 10 days after his procedure.  Called nurse, nurse sent a scheduled message for me to schedule the appt,  Scheduled appt per 3/20 scheduled message.  Patient wife did not want the appt that following week, she stated her husband was to weak and wanted the appt scheduled the week of March 30th.  Call the MD nurse and she stated it was okay.  Patient wife aware of appt date and time.  Put the appt with Lattie Haw because the patient did not want an appt that was to early in the morning.

## 2018-12-22 NOTE — Discharge Summary (Signed)
Patient ID: Troy Kenner [name] MRN: [id number] DOB/AGE: 79/24/1942 78 y.o.  Admit date: 12/12/2018 Discharge date: 12/22/2018  Discharge Diagnoses Patient Active Problem List   Diagnosis Date Noted  . Cancer of sigmoid colon ([place]) 12/12/2018  . Pleural effusion on right 07/15/2018  . Disease of lymph node   . Colon adenocarcinoma ([place]) 06/30/2018  . Acute GI bleeding 06/23/2018  . Acute blood loss anemia 06/23/2018  . Weakness generalized 06/23/2018  . Depression 05/06/2018  . Syncope 01/29/2018  . Chronic diastolic CHF (congestive heart failure) ([place]) 05/04/2017  . CAD (coronary artery disease) 05/03/2017  . MGUS (monoclonal gammopathy of unknown significance) 03/07/2016  . Benign essential HTN 01/19/2016  . OSA (obstructive sleep apnea) 08/29/2015  . PAF (paroxysmal atrial fibrillation) ([place])   . Glaucoma 06/22/2015  . DOE (dyspnea on exertion) 02/04/2015  . Memory loss, short term 08/03/2014  . Anemia, unspecified 12/28/2013  . Occlusion and stenosis of carotid artery without mention of cerebral infarction 07/09/2013  . COPD GOLD II  07/09/2013  . Chronic renal disease, stage 3, moderately decreased glomerular filtration rate between 30-59 mL/min/1.73 square meter (HCC) 07/06/2013  . TIA (transient ischemic attack) 07/06/2013  . Right carotid bruit 12/01/2012  . Basal cell carcinoma of skin 10/10/2012  . History of lung cancer 03/11/2012  . PAD (peripheral artery disease) ([place]) 12/16/2011  . AAA (abdominal aortic aneurysm) ([place]) 09/27/2011  . Squamous cell lung cancer ([place]) 11/08/2010  . GAIT IMBALANCE 03/02/2009  . Prediabetes 03/02/2009  . COLONIC POLYPS, HX OF 03/02/2009  . OBSTRUCTIVE CHRONIC BRONCHITIS WITHOUT EXACERBAT 01/15/2008  . HYPERPLASIA PROSTATE UNS W/O UR OBST & OTH LUTS 01/15/2008  . HYPERLIPIDEMIA 09/29/2007  . Peripheral vascular disease ([place]) 09/29/2007  . LEUKOPLAKIA, VOCAL CORDS 09/29/2007  . FEMORAL BRUIT 09/29/2007     Consultants None  Procedures XI ROBOT  LOW ANTERIOR RESECTION  ERAS PATHWAY FLEXIBLE SIGMOIDOSCOPY 12/12/2018   Hospital Course: He was admitted postoperatively where he recovered well. Initially there were possible recommendations for SNF/rehab placement. Initially had some issues with placement but ultimately was accepted. On the day of discharge, he and his wife ultimately opted to pursue home health and this was cleared by our PT/OT team. He was tolerating a diet well, moving his bowels, mobilizing and pain well controlled on oral analgesics. He was deemed stable for discharge    Allergies as of 12/19/2018      Reactions   Niacin-lovastatin Er Shortness Of Breath, Other (See Comments)   Dyspnea, flushing   Penicillins Hives, Shortness Of Breath, Other (See Comments)   Flushing  & dyspnea Because of a history of documented adverse serious drug reaction;Medi Alert bracelet  is recommended PCN reaction causing immediate rash, facial/tongue/throat swelling, SOB or lightheadedness with hypotension: Yes PCN reaction causing severe rash involving mucus membranes or skin necrosis: No PCN reaction occurring within the last 10 years: NO PCN reaction that required hospitalization: NO   Sulfonamide Derivatives Hives, Shortness Of Breath, Other (See Comments)   Flushing & dyspnea Because of a history of documented adverse serious drug reaction;Medi Alert bracelet  is recommended   Atorvastatin Other (See Comments)   Myalgias & athralgias- takes Crestor, DOES NOT LIKE LIPITOR      Medication List    TAKE these medications   acetaminophen 325 MG tablet Commonly known as:  TYLENOL Take 650 mg every 6 (six) hours as needed by mouth for moderate pain or headache.   albuterol 108 (90 Base) MCG/ACT inhaler Commonly known as:  PROVENTIL HFA;VENTOLIN  HFA Inhale 2 puffs into the lungs every 6 (six) hours as needed for wheezing or shortness of breath.   amLODipine 5 MG tablet Commonly known  as:  NORVASC Take 1 tablet (5 mg total) by mouth daily.   bimatoprost 0.01 % Soln Commonly known as:  LUMIGAN Place 1 drop at bedtime into both eyes.   budesonide-formoterol 160-4.5 MCG/ACT inhaler Commonly known as:  SYMBICORT Inhale 2 puffs into the lungs 2 (two) times daily.   docusate sodium 100 MG capsule Commonly known as:  COLACE Take 100 mg by mouth daily.   dorzolamide-timolol 22.3-6.8 MG/ML ophthalmic solution Commonly known as:  COSOPT Place 2 drops into both eyes 2 (two) times daily.   doxycycline 100 MG capsule Commonly known as:  VIBRAMYCIN Take 1 capsule (100 mg total) by mouth 2 (two) times daily.   ferrous sulfate 325 (65 FE) MG tablet Take 325 mg by mouth at bedtime.   FLUoxetine 40 MG capsule Commonly known as:  PROZAC Take 1 capsule (40 mg total) by mouth daily.   furosemide 20 MG tablet Commonly known as:  Lasix Take 3 tabs (60 mg) for 3 days, then 2 tabs (40 mg) daily. What changed:    how much to take  how to take this  when to take this  additional instructions   Magnesium 500 MG Caps Take 500 mg by mouth daily.   Melatonin 5 MG Tabs Take 5 mg by mouth at bedtime.   methimazole 10 MG tablet Commonly known as:  TAPAZOLE Take 10 mg in am and 5 mg in pm What changed:    how much to take  how to take this  when to take this  additional instructions   metoprolol succinate 25 MG 24 hr tablet Commonly known as:  TOPROL-XL Take 1 tablet (25 mg total) by mouth daily. Take with or immediately following a meal.   montelukast 10 MG tablet Commonly known as:  SINGULAIR TAKE ONE TABLET AT BEDTIME.   omeprazole 20 MG capsule Commonly known as:  PRILOSEC Take 1 capsule (20 mg total) by mouth daily.   potassium chloride SA 20 MEQ tablet Commonly known as:  K-DUR,KLOR-CON Take 1 tablet (20 mEq total) by mouth daily.   rosuvastatin 40 MG tablet Commonly known as:  CRESTOR Take 1 tablet (40 mg total) by mouth daily.   senna 8.6 MG  tablet Commonly known as:  SENOKOT Take 1 tablet by mouth 2 (two) times daily.   Systane 0.4-0.3 % Soln Generic drug:  Polyethyl Glycol-Propyl Glycol Place 1 drop into both eyes daily as needed (for dry eyes).         Contact information for follow-up providers    Ileana Roup, MD Follow up in 2 week(s).   Specialty:  General Surgery Contact information: [place] [address] [phone]        [facility] Follow up.   Specialty:  Home Health Services Why:  [phone]. HH nursing/physical/occupational therapy,aide/social Geneticist, molecular information for after-discharge care    Destination    [facility] Preferred SNF .   Service:  Skilled Nursing Contact information: [name] Dema Severin, M.D. [facility]

## 2018-12-29 ENCOUNTER — Other Ambulatory Visit: Payer: Self-pay | Admitting: *Deleted

## 2018-12-29 DIAGNOSIS — C187 Malignant neoplasm of sigmoid colon: Secondary | ICD-10-CM

## 2018-12-30 ENCOUNTER — Inpatient Hospital Stay: Payer: Commercial Managed Care - PPO | Admitting: Nurse Practitioner

## 2018-12-30 ENCOUNTER — Other Ambulatory Visit: Payer: Commercial Managed Care - PPO

## 2018-12-30 ENCOUNTER — Telehealth: Payer: Self-pay | Admitting: Oncology

## 2018-12-30 ENCOUNTER — Other Ambulatory Visit: Payer: Self-pay

## 2018-12-30 ENCOUNTER — Inpatient Hospital Stay: Payer: Commercial Managed Care - PPO | Attending: Nurse Practitioner | Admitting: Oncology

## 2018-12-30 DIAGNOSIS — Z85118 Personal history of other malignant neoplasm of bronchus and lung: Secondary | ICD-10-CM | POA: Diagnosis not present

## 2018-12-30 DIAGNOSIS — C187 Malignant neoplasm of sigmoid colon: Secondary | ICD-10-CM | POA: Diagnosis not present

## 2018-12-30 DIAGNOSIS — D509 Iron deficiency anemia, unspecified: Secondary | ICD-10-CM | POA: Diagnosis not present

## 2018-12-30 NOTE — Telephone Encounter (Signed)
Scheduled appt per 3/31 los.  Gave patient the number to central radiology.  Patient aware of appt date and time.

## 2018-12-30 NOTE — Progress Notes (Signed)
Wife reports they have had "minimal exposure" to COVID 19 and will not come in today for appointment. Wife agrees to telephone visit with Dr. Benay Spice today at his 10:30 appointment time. Medication list reconciled w/wife.

## 2018-12-30 NOTE — Progress Notes (Signed)
[facility] OFFICE PROGRESS NOTE   Diagnosis: Colon cancer  INTERVAL HISTORY:   This was a telephone visit with Mr. Copes and his wife.  I spoke to them from my office.  They were at home.  Mr. Urton was last seen at the Cancer center in October 2019.   He underwent a robotic assisted low anterior resection by Dr. Dema Severin on 12/12/2018.  The liver appeared mildly cirrhotic.  No evidence of liver or peritoneal metastases.  A tattoo was noted in sigmoid colon adjacent to a palpable mass.  The anastomosis is at 15 cm from the anal verge.  The pathology ([id number]) revealed a moderately differentiated adenocarcinoma of the sigmoid colon.  Tumor extends into pericolonic tissue.  11 lymph nodes are negative for metastatic carcinoma.  The resection margins are negative.  No macroscopic tumor perforation.  No lymphovascular or perineural invasion.  No tumor deposits.  No loss of mismatch repair protein expression.  MSI-stable.  Lab Results:  Lab Results  Component Value Date   WBC 7.8 12/14/2018   HGB 10.6 (L) 12/14/2018   HCT 35.2 (L) 12/14/2018   MCV 94.1 12/14/2018   PLT 260 12/14/2018   NEUTROABS 5.8 12/03/2018    CMP  Lab Results  Component Value Date   NA 137 12/16/2018   K 4.0 12/16/2018   CL 103 12/16/2018   CO2 25 12/16/2018   GLUCOSE 89 12/16/2018   BUN 13 12/16/2018   CREATININE 0.99 12/16/2018   CALCIUM 8.8 (L) 12/16/2018   PROT 7.0 12/03/2018   ALBUMIN 3.2 (L) 12/03/2018   AST 25 12/03/2018   ALT 26 12/03/2018   ALKPHOS 117 12/03/2018   BILITOT 0.6 12/03/2018   GFRNONAA >60 12/16/2018   GFRAA >60 12/16/2018    Lab Results  Component Value Date   CEA1 1.4 06/26/2018     Medications: I have reviewed the patient's current medications.   Assessment/Plan: 1. Adenocarcinoma of sigmoid colon-stage II (T3N0)   Colonoscopy 06/25/2018- multiple polyps in the ascending and descending colon, partially obstructing mass in the sigmoid colon-biopsy  confirmed adenocarcinoma  CTs 06/25/2018-enlarging right paratracheal lymph node compared to November 2018, no other evidence of metastatic disease  Low anterior resection 12/12/2018, stage II (T3N0) adenocarcinoma the sigmoid colon, no loss of mismatch repair protein expression, MSI-stable    2. Iron deficiency anemia Secondary to bleeding from tumor, exacerbated by anticoagulation with eliquis which is currently on hold. He presented with symptomatic iron deficiency anemia, most recent Hgb 9.5 on 9/26. Hgb improved slightly, 10.7; he will continue oral iron therapy   3.Stage Ib squamous cell carcinoma of right lower lung s/pbronchoscopy, mediastinoscopy, right thoracoscopic lowerlobectomy 01/24/2011 -Path poorly differentiated squamous cell carcinoma with positive vascular invasion LN stations 4R, 4L, 7, 8, 9, 10, 11, and 12 w/o evidence of malignancy - pT2a,pN0,pMX stage IB  -managed and monitored per Dr. Elenor Quinones at [facility]. Last Ct chest 05/01/18 stable, without concerning evidence of recurrence -CT 06/25/2018- nonspecific small bilateral nodules, enlarging superior right paratracheal node -EUS biopsy of the right paratracheal nodes (several rounded nodes measuring 4 cm in total) on 07/10/2018- negative for malignancy  4. COPD 5. Atrial fibrillation 6. Thyrotoxicosis-potentially amiodarone related    Disposition: Mr. Gadson underwent a low anterior resection 12/12/2018.  He has been diagnosed with stage II colon cancer.  I discussed the prognosis and reviewed the details of the surgery pathology report with Mr. Bischof and his wife.  He has a good prognosis for long-term disease-free survival.  I do not recommend adjuvant chemotherapy.  He does not appear to have hereditary non-polyposis colon cancer syndrome, but his family members are at increased risk of developing colorectal cancer.  They should receive appropriate screening.  He will be scheduled for a restaging chest CT and CEA in  approximately 2 months.  I will see him for office visit within a few days of the chest CT.  I called Mr. Diesel at approximately 11 AM and we spoke on the phone for 20 minutes.  Greater than 30 minutes were spent reviewing the operative note, pathology report, and coordinating a treatment plan.  Betsy Coder, MD  12/30/2018  11:00 AM

## 2019-01-05 ENCOUNTER — Ambulatory Visit (INDEPENDENT_AMBULATORY_CARE_PROVIDER_SITE_OTHER): Payer: Commercial Managed Care - PPO | Admitting: *Deleted

## 2019-01-05 ENCOUNTER — Other Ambulatory Visit: Payer: Self-pay

## 2019-01-05 DIAGNOSIS — I5032 Chronic diastolic (congestive) heart failure: Secondary | ICD-10-CM

## 2019-01-05 DIAGNOSIS — I4819 Other persistent atrial fibrillation: Secondary | ICD-10-CM | POA: Diagnosis not present

## 2019-01-05 LAB — CUP PACEART REMOTE DEVICE CHECK
Date Time Interrogation Session: 20200404140634
Implantable Pulse Generator Implant Date: 20161111

## 2019-01-13 NOTE — Progress Notes (Signed)
Carelink Summary Report / Loop Recorder 

## 2019-01-14 ENCOUNTER — Encounter (HOSPITAL_COMMUNITY): Payer: Self-pay

## 2019-01-19 ENCOUNTER — Encounter (HOSPITAL_COMMUNITY): Payer: Commercial Managed Care - PPO | Admitting: Cardiology

## 2019-01-20 ENCOUNTER — Ambulatory Visit (INDEPENDENT_AMBULATORY_CARE_PROVIDER_SITE_OTHER): Payer: Commercial Managed Care - PPO | Admitting: Internal Medicine

## 2019-01-20 ENCOUNTER — Encounter: Payer: Self-pay | Admitting: Internal Medicine

## 2019-01-20 ENCOUNTER — Other Ambulatory Visit: Payer: Self-pay

## 2019-01-20 DIAGNOSIS — E0580 Other thyrotoxicosis without thyrotoxic crisis or storm: Secondary | ICD-10-CM | POA: Diagnosis not present

## 2019-01-20 MED ORDER — METHIMAZOLE 5 MG PO TABS: 5.0000 mg | ORAL_TABLET | Freq: Every day | ORAL | 3 refills | Status: DC

## 2019-01-20 NOTE — Patient Instructions (Signed)
Please decrease Methimazole to 5 mg daily and come back for labs in 1.5 months.  Please come back for a follow-up appointment in 3 months.

## 2019-01-20 NOTE — Progress Notes (Signed)
Patient ID: Troy Spiller [name], male   DOB: 1941/04/03, 78 y.o.   MRN: [id number]   Patient location: Home My location: Office  Referring Provider: Binnie Rail, MD  I connected with the patient and his wife (who offers additional information about his surgery and also has questions about his vitamin supplement) on 01/20/19 at 11:21 AM EDT by a video enabled telemedicine application and verified that I am speaking with the correct person.   I discussed the limitations of evaluation and management by telemedicine and the availability of in person appointments. The patient expressed understanding and agreed to proceed.   Details of the encounter are shown below.  HPI  Troy Meinecke [name] is a 78 y.o.-year-old male, initially referred by his cardiologist, 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  Last OV ~ 1.5 mo ago.  Pt has a h/o colon Ca >> had low anterior resection 11/2018. No metastases.  The tumor did extend.:,  But without affected lymph nodes (of 11 sample). He does not need ChTx and RxTx. He will have a new colonoscopy 07/2019.  His oncologist is Dr. Malachy Mood.  Dr. Learta Codding.  Reviewed and addended hx: Patient is seen in the cardiology clinic for several cardiovascular conditions: paroxysmal Afib, Diastolic CHF, CAD.  He also has a history of AAA, PAD, history of TIA.  He was found to have a low TSH at his visit on 07/14/2018.  He had repeat TFTs 9 days later and this showed again a suppressed TSH and also elevated free T4 and free T3.    She was taking a high dose of biotin, 10,000 mcg daily and he took this before his TFTs in 10-08/2018. He had a CT of the chest with contrast on 06/25/2018.  At that time, his amiodarone (that he was taking for almost 1 year) was stopped.    He was started on methimazole 10 mg daily.  He also started Toprol-XL 25 mg daily.  We had to increase the dose of methimazole slightly to 15 mg daily >> but then decreased to 10  mg daily.  He continues on this dose today.  We also started Prednisone 10 mg daily >> we were then able to decrease the dose and finally stop 10/2018.  TFTs normalized at last check in 11/2018.  Reviewed TFTs: Lab Results  Component Value Date   TSH 3.57 11/27/2018   TSH 0.02 (L) 10/27/2018   TSH <0.010 (L) 10/14/2018   TSH <0.01 (L) 09/26/2018   TSH <0.01 (L) 08/27/2018   TSH 0.010 (L) 07/23/2018   TSH 0.03 (L) 07/14/2018   TSH 0.751 06/24/2018   TSH 1.581 05/26/2018   TSH 1.519 08/05/2017   FREET4 0.90 11/27/2018   FREET4 1.34 10/27/2018   FREET4 1.71 10/14/2018   FREET4 2.64 (H) 09/26/2018   FREET4 3.84 (H) 08/27/2018   FREET4 2.94 (H) 07/23/2018   FREET4 0.9 07/29/2006   T3FREE 2.9 11/27/2018   T3FREE 3.0 10/27/2018   T3FREE 3.1 10/14/2018   T3FREE 3.9 09/26/2018   T3FREE 5.6 (H) 08/27/2018   T3FREE 4.7 (H) 07/23/2018   T3FREE 3.0 07/29/2006   Graves Ab's were not elevated: Lab Results  Component Value Date   TSI <89 08/27/2018   Pt denies: - feeling nodules in neck - hoarseness - dysphagia - choking - SOB with lying down  Pt does not have a FH of thyroid ds. No FH of thyroid cancer. No h/o radiation tx to head or neck.  No herbal supplements.  No Biotin use. No recent steroids use.   Pt. also has a history of lung cancer, HL, COPD, OSA - on CPAP.  ROS: Constitutional: no weight gain/no weight loss, no fatigue, no subjective hyperthermia, no subjective hypothermia Eyes: no blurry vision, no xerophthalmia ENT: no sore throat, + see HPI Cardiovascular: no CP/no SOB/no palpitations/no leg swelling Respiratory: no cough/no SOB/no wheezing Gastrointestinal: no N/no V/no D/no C/no acid reflux Musculoskeletal: no muscle aches/no joint aches Skin: no rashes, no hair loss Neurological: no tremors/no numbness/no tingling/no dizziness  I reviewed pt's medications, allergies, PMH, social hx, family hx, and changes were documented in the history of present  illness. Otherwise, unchanged from my initial visit note.  Past Medical History:  Diagnosis Date  . AAA (abdominal aortic aneurysm) ([address]) LAST ABDOMINAL US 10-20-17 3.3 CM   MONITORED BY DR [name]  . Anxiety   . Arthritis   . Atrial fibrillation ([place])   . Basal cell carcinoma   . Bilateral carotid artery stenosis DUPLEX 12-29-2012  BY DR [facility]   BILATERAL ICA STENOSIS 60-79%  . BPH (benign prostatic hypertrophy)   . Chronic diastolic heart failure ([place])   . Chronic kidney disease    stage 3, pt unaware  . Colon cancer ([place])   . Complication of anesthesia    had nausea after carotid artery surgery, states the medicine given to help the nausea, made it worse  . COPD (chronic obstructive pulmonary disease) ([place])   . Depression   . GERD (gastroesophageal reflux disease)   . Glaucoma BOTH EYES   Dr Gershon Crane  . History of basal cell carcinoma excision   . History of lung cancer APRIL 2012  SQUAMOUS CELL---- S/P RIGHT LOWER LOBECTOMY AT [facility] --  NO CHEMORADIATION---  NO RECURRENCE    ONCOLOGIST- DR Tressie Stalker  LOV [facility] 10-27-2012  . History of pneumothorax    pt unaware  . Hx of adenomatous colonic polyps 2005    X 2; 1 hyperplastic polyp; Dr Olevia Perches  . Hyperlipidemia   . Hypertension   . Impaired fasting glucose 2007   108; A1c5.4%  . Lesion of bladder   . Lung cancer ([place]) 01/25/2012  . Microhematuria   . OSA (obstructive sleep apnea) 08/29/2015   CPAP SET ON 10  . PAD (peripheral artery disease) ([place]) ABI'S  JAN 2014  0.65 ON RIGHT ;  1.04 ON LEFT  . Peripheral vascular disease ([place]) S/P ANGIOPLASTY AND STENTING   FOLLOWED  BY DR [name]  . Pleural effusion on right 11/18/2018   Chronic, noted on CXR  . Spinal stenosis Sept. 2015  . Status post placement of implantable loop recorder   . Stroke [facility]) Jul 07, 2013   mini  TIA  . Thoracic aorta atherosclerosis ([place])   . Thyrotoxicosis    amiodarone induced  . Urgency of urination    Past Surgical History:  Procedure  Laterality Date  . ANGIO PLASTY     X 4 in legs  . AORTOGRAM  07-27-2002   MILD DIFFUSE ILIAC ARTERY OCCLUSIVE DISEASE /  LEFT RENAL ARTERY 20%/ PATENT LEFT FEM-POP GRAFT/ MILD SFA AND POPLITEAL ARTERY OCCLUSIVE DISEASE W/ SEVERE KIDNEY OCCLUSIVE DISEASE  . BASAL CELL CARCINOMA EXCISION     MULTIPLE TIMES--  RIGHT FOREARM, CHEEKS, AND BACK  . BIOPSY  06/25/2018   Procedure: BIOPSY;  Surgeon: Rush Landmark Telford Nab., MD;  Location: [facility];  Service: Gastroenterology;;  . CARDIOVASCULAR STRESS TEST  12-08-2012  DR [facility]   NORMAL [place] Adam WITH  NO EXERCISE NUCLEAR STUDY/ EF 66%/   NO ISCHEMIA/ NO SIGNIFICANT CHANGE FROM PRIOR STUDY  . CARDIOVERSION N/A 02/14/2018   Procedure: CARDIOVERSION;  Surgeon: Larey Dresser, MD;  Location: [facility] ENDOSCOPY;  Service: Cardiovascular;  Laterality: N/A;  . CAROTID ANGIOGRAM N/A 07/10/2013   Procedure: CAROTID ANGIOGRAM;  Surgeon: Elam Dutch, MD;  Location: [facility] CATH LAB;  Service: Cardiovascular;  Laterality: N/A;  . CAROTID ENDARTERECTOMY Right 07-14-13   cea  . CATARACT EXTRACTION W/ INTRAOCULAR LENS  IMPLANT, BILATERAL    . colon polyectomy    . COLONOSCOPY WITH PROPOFOL N/A 06/25/2018   Procedure: COLONOSCOPY WITH PROPOFOL;  Surgeon: Rush Landmark Telford Nab., MD;  Location: [place];  Service: Gastroenterology;  Laterality: N/A;  . CYSTOSCOPY W/ RETROGRADES Bilateral 01/21/2013   Procedure: CYSTOSCOPY WITH RETROGRADE PYELOGRAM;  Surgeon: Molli Hazard, MD;  Location: [facility];  Service: Urology;  Laterality: Bilateral;   CYSTO, BLADDER BIOPSY, BILATERAL RETROGRADE PYELOGRAM  RAD TECH FROM RADIOLOGY PER JOY  . CYSTOSCOPY WITH BIOPSY N/A 01/21/2013   Procedure: CYSTOSCOPY WITH BIOPSY;  Surgeon: Molli Hazard, MD;  Location: [facility];  Service: Urology;  Laterality: N/A;  . ENDARTERECTOMY Right 07/14/2013   Procedure: ENDARTERECTOMY CAROTID;  Surgeon: Angelia Mould, MD;  Location: [place];   Service: Vascular;  Laterality: Right;  . EP IMPLANTABLE DEVICE N/A 08/12/2015   Procedure: Loop Recorder Insertion;  Surgeon: Thompson Grayer, MD;  Location: [place] CV LAB;  Service: Cardiovascular;  Laterality: N/A;  . ESOPHAGOGASTRODUODENOSCOPY (EGD) WITH PROPOFOL N/A 06/25/2018   Procedure: ESOPHAGOGASTRODUODENOSCOPY (EGD) WITH PROPOFOL;  Surgeon: Rush Landmark Telford Nab., MD;  Location: [facility];  Service: Gastroenterology;  Laterality: N/A;  . ESOPHAGOGASTRODUODENOSCOPY (EGD) WITH PROPOFOL N/A 07/10/2018   Procedure: ESOPHAGOGASTRODUODENOSCOPY (EGD) WITH PROPOFOL;  Surgeon: Milus Banister, MD;  Location: [facility] ENDOSCOPY;  Service: Endoscopy;  Laterality: N/A;  . EUS N/A 07/10/2018   Procedure: UPPER ENDOSCOPIC ULTRASOUND (EUS) RADIAL;  Surgeon: Milus Banister, MD;  Location: [facility] ENDOSCOPY;  Service: Endoscopy;  Laterality: N/A;  . EYE SURGERY Right   . FEMORAL-POPLITEAL BYPASS GRAFT Left 1994  [facility]   AND 2001  IN [place]  . FEMORAL-POPLITEAL BYPASS GRAFT Left 08/30/2015   Procedure: REVISION OF BYPASS GRAFT Left  FEMORAL-POPLITEAL ARTERY;  Surgeon: Angelia Mould, MD;  Location: [place];  Service: Vascular;  Laterality: Left;  . FINE NEEDLE ASPIRATION N/A 07/10/2018   Procedure: FINE NEEDLE ASPIRATION (FNA) LINEAR;  Surgeon: Milus Banister, MD;  Location: [facility] ENDOS[facility];  Service: Endoscopy;  Laterality: N/A;  . FLEXIBLE SIGMOIDOSCOPY N/A 12/12/2018   Procedure: FLEXIBLE SIGMOIDOSCOPY;  Surgeon: Ileana Roup, MD;  Location: [facility] ORS;  Service: General;  Laterality: N/A;  . LARYNGOSCOPY  06-27-2004   BX VOCAL CORD  (LEUKOPLAKIA)  PER PT NO ISSUES SINCE  . LEFT HEART CATH AND CORONARY ANGIOGRAPHY N/A 08/20/2017   Procedure: LEFT HEART CATH AND CORONARY ANGIOGRAPHY;  Surgeon: Larey Dresser, MD;  Location: [name] CV LAB;  Service: Cardiovascular;  Laterality: N/A;  . LOWER EXTREMITY ANGIOGRAM Bilateral 08/29/2015   Procedure: Lower Extremity Angiogram;  Surgeon:  Angelia Mould, MD;  Location: [place] CV LAB;  Service: Cardiovascular;  Laterality: Bilateral;  . LUNG LOBECTOMY  01/24/2011    RIGHT UPPER LOBE  (SQUAMOUS CELL CARCINOMA) Dr Dorthea Cove , [facility]. No chemotherapyor radiation  . PATCH ANGIOPLASTY Right 07/14/2013   Procedure: PATCH ANGIOPLASTY;  Surgeon: Angelia Mould, MD;  Location: [place];  Service: Vascular;  Laterality: Right;  . PATCH ANGIOPLASTY Left 08/30/2015   Procedure: VEIN PATCH ANGIOPLASTY OF PROXIMAL Left BYPASS GRAFT;  Surgeon: Angelia Mould, MD;  Location: [name];  Service: Vascular;  Laterality: Left;  . PERIPHERAL VASCULAR CATHETERIZATION N/A 08/29/2015   Procedure: Abdominal Aortogram;  Surgeon: Angelia Mould, MD;  Location: [place] CV LAB;  Service: Cardiovascular;  Laterality: N/A;  . POLYPECTOMY  06/25/2018   Procedure: POLYPECTOMY;  Surgeon: Rush Landmark Telford Nab., MD;  Location: [place];  Service: Gastroenterology;;  . Lia Foyer INJECTION  06/25/2018   Procedure: SUBMUCOSAL INJECTION;  Surgeon: Irving Copas., MD;  Location: [place];  Service: Gastroenterology;;  . TEE WITHOUT CARDIOVERSION N/A 02/14/2018   Procedure: TRANSESOPHAGEAL ECHOCARDIOGRAM (TEE);  Surgeon: Larey Dresser, MD;  Location: [facility] ENDOSCOPY;  Service: Cardiovascular;  Laterality: N/A;  . trabecular surgery     OS  . TRANSTHORACIC ECHOCARDIOGRAM  12-29-2012  DR [facility]   MILD LVH/  LVSF NORMAL/ EF 40-34%/  GRADE I DIASTOLIC DYSFUNCTION   Social History   Socioeconomic History  . Marital status: Married    Spouse name: [name]   . Number of children: 3  . Years of education: 12+  . Highest education level: Not on file  Occupational History  . 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 Financial resource strain: Not on file  . Food insecurity:    Worry: Not on file     Inability: Not on file  . Transportation needs:    Medical: Not on file    Non-medical: Not on file  Tobacco Use  . Smoking status: Former Smoker    Packs/day: 0.50    Years: 50.00    Pack years: 25.00    Types: Cigarettes    Last attempt to quit: 07/28/2014    Years since quitting: 4.4  . Smokeless tobacco: Never Used  Substance and Sexual Activity  . Alcohol use: Yes    Alcohol/week: 0.0 standard drinks    Comment:  socially, variable  . Drug use: No  . Sexual activity: Not on file  Lifestyle  . Physical activity:    Days per week: Not on file    Minutes per session: Not on file  . Stress: Not on file  Relationships  . Social connections:    Talks on phone: Not on file    Gets together: Not on file    Attends religious service: Not on file    Active member of club or organization: Not on file    Attends meetings of clubs or organizations: Not on file    Relationship status: Not on file  . Intimate partner violence:    Fear of current or ex partner: Not on file    Emotionally abused: Not on file    Physically abused: Not on file    Forced sexual activity: Not on file  Other Topics Concern  . Not on file  Social History Narrative   Patient lives at home with spouse [name]   Patient has 3 children    Patient is right handed    Current Outpatient Medications on File Prior to Visit  Medication Sig Dispense Refill  . acetaminophen (TYLENOL) 325 MG tablet Take 650 mg every 6 (six) hours as needed by mouth for moderate pain or headache.    . bimatoprost (LUMIGAN) 0.01 % SOLN Place 1 drop  at bedtime into both eyes.     . budesonide-formoterol (SYMBICORT) 160-4.5 MCG/ACT inhaler Inhale 2 puffs into the lungs 2 (two) times daily.     [name] docusate sodium (COLACE) 100 MG capsule Take 100 mg by mouth daily.     . dorzolamide-timolol (COSOPT) 22.3-6.8 MG/ML ophthalmic solution Place 2 drops into both eyes 2 (two) times daily.     . ferrous sulfate 325 (65 FE) MG tablet Take 325 mg  by mouth at bedtime.     [name] FLUoxetine (PROZAC) 40 MG capsule Take 1 capsule (40 mg total) by mouth daily. 30 capsule 0  . furosemide (LASIX) 20 MG tablet Take 3 tabs (60 mg) for 3 days, then 2 tabs (40 mg) daily. (Patient taking differently: Take 40 mg by mouth daily. ) 75 tablet 3  . Magnesium 500 MG CAPS Take 500 mg by mouth daily.     . Melatonin 5 MG TABS Take 5 mg by mouth at bedtime.    . methimazole (TAPAZOLE) 10 MG tablet Take 10 mg in am and 5 mg in pm (Patient taking differently: Take 10 mg by mouth daily. Take 10 MG every morning.) 60 tablet 6  . metoprolol succinate (TOPROL-XL) 25 MG 24 hr tablet Take 1 tablet (25 mg total) by mouth daily. Take with or immediately following a meal. 90 tablet 3  . montelukast (SINGULAIR) 10 MG tablet TAKE ONE TABLET AT BEDTIME. (Patient taking differently: Take 10 mg by mouth at bedtime. ) 30 tablet 2  . omeprazole (PRILOSEC) 20 MG capsule Take 1 capsule (20 mg total) by mouth daily. 90 capsule 3  . Polyethyl Glycol-Propyl Glycol (SYSTANE) 0.4-0.3 % SOLN Place 1 drop into both eyes daily as needed (for dry eyes).     . potassium chloride SA (K-DUR,KLOR-CON) 20 MEQ tablet Take 1 tablet (20 mEq total) by mouth daily. 30 tablet 11  . rosuvastatin (CRESTOR) 40 MG tablet Take 1 tablet (40 mg total) by mouth daily. 30 tablet 11  . senna (SENOKOT) 8.6 MG tablet Take 1 tablet by mouth 2 (two) times daily.    [name] albuterol (PROVENTIL HFA;VENTOLIN HFA) 108 (90 Base) MCG/ACT inhaler Inhale 2 puffs into the lungs every 6 (six) hours as needed for wheezing or shortness of breath. (Patient not taking: Reported on 12/30/2018) 1 Inhaler 0  . amLODipine (NORVASC) 5 MG tablet Take 1 tablet (5 mg total) by mouth daily. 30 tablet 11   No current facility-administered medications on file prior to visit.    Allergies  Allergen Reactions  . Niacin-Lovastatin Er Shortness Of Breath and Other (See Comments)    Dyspnea, flushing  . Penicillins Hives, Shortness Of Breath and  Other (See Comments)    Flushing  & dyspnea Because of a history of documented adverse serious drug reaction;Medi Alert bracelet  is recommended PCN reaction causing immediate rash, facial/tongue/throat swelling, SOB or lightheadedness with hypotension: Yes PCN reaction causing severe rash involving mucus membranes or skin necrosis: No PCN reaction occurring within the last 10 years: NO PCN reaction that required hospitalization: NO  . Sulfonamide Derivatives Hives, Shortness Of Breath and Other (See Comments)    Flushing & dyspnea Because of a history of documented adverse serious drug reaction;Medi Alert bracelet  is recommended  . Atorvastatin Other (See Comments)    Myalgias & athralgias- takes Crestor, DOES NOT LIKE LIPITOR   Family History  Problem Relation Age of Onset  . Stroke Mother        mini strokes  .  Alcohol abuse Father   . Heart disease Father        MI after 48  . Stroke Father   . Hypertension Father   . Heart attack Father   . Heart disease Paternal Aunt        several  . Hypertension Paternal Aunt        several  . Stroke Paternal Aunt        several  . Stroke Paternal Uncle        several  . Heart disease Paternal Uncle        several;2 had MI pre 7  . Cancer Daughter 44       breast ca, also with benign sessile polyp     PE: There were no vitals taken for this visit. Wt Readings from Last 3 Encounters:  12/19/18 183 lb 13.8 oz (83.4 kg)  12/03/18 187 lb 8 oz (85 kg)  11/27/18 188 lb (85.3 kg)   Constitutional:  in NAD  The physical exam was not performed (virtual visit).  ASSESSMENT: 1.  Amiodarone-induced thyrotoxicosis  PLAN:  1. Patient with a recent dx of amiodarone-induced thyrotoxicosis, without thyrotoxic sxs other than possible wt loss.  However, he did mention that this was intentional.  He denied heat intolerance, palpitations, anxiety, hyper defecation.  Of note, he does have a history of paroxysmal atrial fibrillation and is on  amiodarone.  Around the time of his initial investigation for thyrotoxicosis, he was also taking a high dose of biotin which is known to influence TFTs and mimic thyrotoxicosis and had a contrasted CT scan which could have influenced the thyroid tests further. -We initially started methimazole and then also prednisone 10 mg daily.  He did well afterwards and we were able to decrease the dose of prednisone and methimazole.  We actually stopped prednisone in 10/2018.  Subsequent labs in 11/2022 completely normal.  At this visit, he continues on 10 mg of methimazole.  He is still on Toprol-XL 25 mg daily for his A. fib. -At this visit, he tells me he feels good after his surgery, without thyrotoxic symptoms -He would be ideal to have a new set of TFTs, however, due to the coronavirus pandemic, we will postpone checking his thyroid tests but I did advise him to decrease the methimazole dose to 5 mg daily.  I will then have him come back to clinic for labs if 5 to 6 weeks.  He agrees to do so. -His wife was curious whether he should continue to hold multivitamins due to the presence of biotin.  I advised him that he can restart on the vitamin but hold the dose in the day that we are checking TFTs. -I refilled his methimazole prescription -I will see the patient back in 3 months for a regular visit  CC: Dr. Dema Severin - [facility].  - time spent with the patient and his wife: 15 min, of which >50% was spent in obtaining information about his symptoms, reviewing his previous labs, evaluations, tumor pathology report, and treatments, counseling him about his condition (please see the discussed topics above), and developing a plan to further investigate and treat it; he had a number of questions which I addressed.  Philemon Kingdom, MD PhD [facility]

## 2019-01-23 ENCOUNTER — Telehealth: Payer: Self-pay | Admitting: Cardiology

## 2019-01-23 NOTE — Telephone Encounter (Signed)
New Message:    Patient calling concerning her husbands device and would like to speak with some one. Please call patient back.

## 2019-01-23 NOTE — Telephone Encounter (Signed)
It's really difficult to tell. Presenting looks like sinus with PACs but some of the AF episodes are more consistent with AF. We are working with her to get a manual transmission that will give [name] more data.  By Monday, I should know for sure based off of what comes in over the weekend

## 2019-01-23 NOTE — Telephone Encounter (Signed)
Spoke with wife, patient has had increased heart rates recently as reflected on ILR trend. He has been off anticoagulation for several months and is now post colon resection. He has had some increased shortness of breath. He has an appt scheduled virtually with HF clinic on Monday. I will send this information over to them and also Dr Rayann Heman to review timing for restarting anticoagulation.      Chanetta Marshall, NP 01/23/2019 12:33 PM

## 2019-01-23 NOTE — Telephone Encounter (Signed)
Patient wife called and stated that patient has had a fast irregular this week. The HR has been 77-102. Patient wife states that patient has been feeling tired this week. Patient wife states no chest pain. Some shortness of breath, and a little woozy feeling. Attempted to help pt wife send a manual transmission. Error coder 3230 in the middle of the transmission. Monitor did update today 01/23/2019. Instructed pt wife to call tech support to further help trouble shooting the monitor. Informed her that I would send a message to Device Tech RN to review today report and call back w/ the results from the automatic update. Pt wife verbalized understanding.

## 2019-01-23 NOTE — Telephone Encounter (Signed)
Looks like he will need to restart Eliquis.  Was it definite atrial fibrillation?

## 2019-01-26 ENCOUNTER — Encounter (HOSPITAL_COMMUNITY): Payer: Self-pay

## 2019-01-26 ENCOUNTER — Ambulatory Visit (HOSPITAL_COMMUNITY)
Admission: RE | Admit: 2019-01-26 | Discharge: 2019-01-26 | Disposition: A | Discharge status: Home / Self Care | Payer: Commercial Managed Care - PPO | Source: Ambulatory Visit | Attending: Cardiology | Admitting: Cardiology

## 2019-01-26 ENCOUNTER — Encounter (HOSPITAL_COMMUNITY): Payer: Self-pay | Admitting: *Deleted

## 2019-01-26 ENCOUNTER — Other Ambulatory Visit (HOSPITAL_COMMUNITY): Payer: Self-pay | Admitting: Cardiology

## 2019-01-26 ENCOUNTER — Other Ambulatory Visit: Payer: Self-pay

## 2019-01-26 VITALS — BP 125/65 | HR 58

## 2019-01-26 DIAGNOSIS — I5032 Chronic diastolic (congestive) heart failure: Secondary | ICD-10-CM | POA: Diagnosis not present

## 2019-01-26 DIAGNOSIS — I480 Paroxysmal atrial fibrillation: Secondary | ICD-10-CM

## 2019-01-26 DIAGNOSIS — E0590 Thyrotoxicosis, unspecified without thyrotoxic crisis or storm: Secondary | ICD-10-CM

## 2019-01-26 DIAGNOSIS — R0602 Shortness of breath: Secondary | ICD-10-CM

## 2019-01-26 MED ORDER — APIXABAN 5 MG PO TABS: 5.0000 mg | ORAL_TABLET | Freq: Two times a day (BID) | ORAL | 6 refills | Status: DC

## 2019-01-26 NOTE — Progress Notes (Signed)
Heart Failure TeleHealth Note  Due to national recommendations of social distancing due to [place] 19, Audio/video telehealth visit is felt to be most appropriate for this patient at this time.  See MyChart message from today for patient consent regarding telehealth for [name].  Date:  01/26/2019   ID:  Troy Kenner [name], DOB 03/23/1941, MRN [id number]  Location: Home  Provider location: [place] [facility] Type of Visit: Established patient   PCP:  Binnie Rail, MD  Cardiologist:  No primary care provider on file. Primary HF: Dr Aundra Dubin   Chief Complaint:  Heart Failure    History of Present Illness: Troy Codd [name] is a 78 y.o. male with a history of fHTN, COPD, active smoking/COPD, carotid stenosis s/p right CEA, paroxysmal atrial fibrillation, and PAD returns for followup of CHF and atrial fibrillation.  He does not have known obstructive CAD but is at high risk for CAD based on his comorbidities.  Lexiscan Cardiolite in 3/14 showed no ischemia or infarction and echo in 3/14 showed normal EF.  He had a left fem-pop bypass at [facility] in the '90[facility].  [facility]. He has chronic right calf/thigh/buttocks claudication that is unchanged over the last few years and follows regularly at [facility].  He has had right lower lobectomy for lung cancer.  He continues to stay off cigarettes.   At a prior appointment, he was in atrial fibrillation but did not realize it. Started on Eliquis and diltiazem CD, and he spontaneously converted to NSR.  In 5/15, he was at [facility] and felt "strange" one day: fatigued, weak, short of breath.  He went to the ER and was in atrial fibrillation with HR in 80s-90s.  He spontaneously converted to NSR in the ER.  He felt back to normal after converting to NSR. Started him on Multaq 400 mg bid.  With CHF, he was eventually transitioned over to amiodarone.   He had patch angioplasty revision of left fem-pop bypass in 11/16.   Now with minimal claudication.  He follows with [facility].     He had a Cardiolite and echo in 7/17 that were unremarkable.  Repeat Cardiolite in 12/17 showed no ischemia, echo was uninterpretable.  Cardiac MRI was therefore done in 1/18, showing EF 66% with normal-appearing RV, no late gadolinium enhancement.   Given increased exertional dyspnea and a defect on Cardiolite, he had LHC in 11/18.  This showed no obstructive CAD. He had a chest CT done given concern for possible amiodarone lung toxicity with increased dyspnea. This showed emphysema, no ILD.    In 5/19, he developed a probable viral syndrome with dehydration and had a presyncopal episode.  He was orthostatic in the hospital.  He was noted to be in atrial fibrillation this admission which remained persistent.  He had a TEE-guided DCCV back to NSR.   In 9/19, he was admitted with GI bleeding and found to have a sigmoid mass on colonoscopy, path showed sigmoid adenocarcinoma.  He was noted to have right paratracheal lymphadenopathy but EUS with biopsy in 10/19 did not show malignancy.  Eliquis was stopped with bleeding sigmoid mass.  He was noted to be back in atrial fibrillation this admission.   Amiodarone was stopped with elevated ESR and increased dyspnea.  He was also found to have new hyperthyroidism. He has been started on methimazole and following with endocrinology.  Colon cancer surgery has been on hold while hyperthyroidism  is controlled.   He presents via Psychiatric nurse for a telehealth visit today.  Says he is recovering from colon surgery. He has been off eliquis for his colon surgery. On 01/23/19 there was concern he was back in A fib.  Overall feeling ok. Over the weekend he had some shortness of breath but today he feels better. Today he endorsed minimal dyspnea with exertion. Denies PND/Orthopnea. Appetite ok. No fever or chills. Weight at home 183-184  pounds. Taking all medications.    he denies symptoms worrisome for  COVID 19.   Past Medical History:  Diagnosis Date  . AAA (abdominal aortic aneurysm) ([place]) LAST ABDOMINAL US 10-20-17 3.3 CM   MONITORED BY DR [name]  . Anxiety   . Arthritis   . Atrial fibrillation ([place])   . Basal cell carcinoma   . Bilateral carotid artery stenosis DUPLEX 12-29-2012  BY DR [facility]   BILATERAL ICA STENOSIS 60-79%  . BPH (benign prostatic hypertrophy)   . Chronic diastolic heart failure ([place])   . Chronic kidney disease    stage 3, pt unaware  . Colon cancer ([place])   . Complication of anesthesia    had nausea after carotid artery surgery, states the medicine given to help the nausea, made it worse  . COPD (chronic obstructive pulmonary disease) ([place])   . Depression   . GERD (gastroesophageal reflux disease)   . Glaucoma BOTH EYES   Dr Gershon Crane  . History of basal cell carcinoma excision   . History of lung cancer APRIL 2012  SQUAMOUS CELL---- S/P RIGHT LOWER LOBECTOMY AT [facility] --  NO CHEMORADIATION---  NO RECURRENCE    ONCOLOGIST- DR Tressie Stalker  LOV IN [facility] 10-27-2012  . History of pneumothorax    pt unaware  . Hx of adenomatous colonic polyps 2005    X 2; 1 hyperplastic polyp; Dr Olevia Perches  . Hyperlipidemia   . Hypertension   . Impaired fasting glucose 2007   108; A1c5.4%  . Lesion of bladder   . Lung cancer ([place]) 01/25/2012  . Microhematuria   . OSA (obstructive sleep apnea) 08/29/2015   CPAP SET ON 10  . PAD (peripheral artery disease) ([place]) ABI'S  JAN 2014  0.65 ON RIGHT ;  1.04 ON LEFT  . Peripheral vascular disease ([place]) S/P ANGIOPLASTY AND STENTING   FOLLOWED  BY DR [name]  . Pleural effusion on right 11/18/2018   Chronic, noted on CXR  . Spinal stenosis Sept. 2015  . Status post placement of implantable loop recorder   . Stroke [facility]) Jul 07, 2013   mini  TIA  . Thoracic aorta atherosclerosis ([place])   . Thyrotoxicosis    amiodarone induced  . Urgency of urination    Past Surgical History:  Procedure Laterality Date  . ANGIO PLASTY     X 4 in legs   . AORTOGRAM  07-27-2002   MILD DIFFUSE ILIAC ARTERY OCCLUSIVE DISEASE /  LEFT RENAL ARTERY 20%/ PATENT LEFT FEM-POP GRAFT/ MILD SFA AND POPLITEAL ARTERY OCCLUSIVE DISEASE W/ SEVERE KIDNEY OCCLUSIVE DISEASE  . BASAL CELL CARCINOMA EXCISION     MULTIPLE TIMES--  RIGHT FOREARM, CHEEKS, AND BACK  . BIOPSY  06/25/2018   Procedure: BIOPSY;  Surgeon: Rush Landmark Telford Nab., MD;  Location: [place];  Service: Gastroenterology;;  . CARDIOVASCULAR STRESS TEST  12-08-2012  DR MCLEAN   NORMAL LEXISCAN WITH NO EXERCISE NUCLEAR STUDY/ EF 66%/   NO ISCHEMIA/ NO SIGNIFICANT CHANGE FROM PRIOR STUDY  . CARDIOVERSION N/A 02/14/2018  Procedure: CARDIOVERSION;  Surgeon: Larey Dresser, MD;  Location: [facility] ENDOSCOPY;  Service: Cardiovascular;  Laterality: N/A;  . CAROTID ANGIOGRAM N/A 07/10/2013   Procedure: CAROTID ANGIOGRAM;  Surgeon: Elam Dutch, MD;  Location: [facility] CATH LAB;  Service: Cardiovascular;  Laterality: N/A;  . CAROTID ENDARTERECTOMY Right 07-14-13   cea  . CATARACT EXTRACTION W/ INTRAOCULAR LENS  IMPLANT, BILATERAL    . colon polyectomy    . COLONOSCOPY WITH PROPOFOL N/A 06/25/2018   Procedure: COLONOSCOPY WITH PROPOFOL;  Surgeon: Rush Landmark Telford Nab., MD;  Location: [place];  Service: Gastroenterology;  Laterality: N/A;  . CYSTOSCOPY W/ RETROGRADES Bilateral 01/21/2013   Procedure: CYSTOSCOPY WITH RETROGRADE PYELOGRAM;  Surgeon: Molli Hazard, MD;  Location[facility];  Service: Urology;  Laterality: Bilateral;   CYSTO, BLADDER BIOPSY, BILATERAL RETROGRADE PYELOGRAM  RAD TECH FROM RADIOLOGY PER JOY  . CYSTOSCOPY WITH BIOPSY N/A 01/21/2013   Procedure: CYSTOSCOPY WITH BIOPSY;  Surgeon: Molli Hazard, MD;  Location: [facility];  Service: Urology;  Laterality: N/A;  . ENDARTERECTOMY Right 07/14/2013   Procedure: ENDARTERECTOMY CAROTID;  Surgeon: Angelia Mould, MD;  Location: [place];  Service: Vascular;  Laterality: Right;  . EP  IMPLANTABLE DEVICE N/A 08/12/2015   Procedure: Loop Recorder Insertion;  Surgeon: Thompson Grayer, MD;  Location: [place] CV LAB;  Service: Cardiovascular;  Laterality: N/A;  . ESOPHAGOGASTRODUODENOSCOPY (EGD) WITH PROPOFOL N/A 06/25/2018   Procedure: ESOPHAGOGASTRODUODENOSCOPY (EGD) WITH PROPOFOL;  Surgeon: Rush Landmark Telford Nab., MD;  Location: [place];  Service: Gastroenterology;  Laterality: N/A;  . ESOPHAGOGASTRODUODENOSCOPY (EGD) WITH PROPOFOL N/A 07/10/2018   Procedure: ESOPHAGOGASTRODUODENOSCOPY (EGD) WITH PROPOFOL;  Surgeon: Milus Banister, MD;  Location: [facility] ENDOSCOPY;  Service: Endoscopy;  Laterality: N/A;  . EUS N/A 07/10/2018   Procedure: UPPER ENDOSCOPIC ULTRASOUND (EUS) RADIAL;  Surgeon: Milus Banister, MD;  Location: [facility] ENDOSCOPY;  Service: Endoscopy;  Laterality: N/A;  . EYE SURGERY Right   . FEMORAL-POPLITEAL BYPASS GRAFT Left [facility]   AND 2001  IN [place]  . FEMORAL-POPLITEAL BYPASS GRAFT Left 08/30/2015   Procedure: REVISION OF BYPASS GRAFT Left  FEMORAL-POPLITEAL ARTERY;  Surgeon: Angelia Mould, MD;  Location: [place];  Service: Vascular;  Laterality: Left;  . FINE NEEDLE ASPIRATION N/A 07/10/2018   Procedure: FINE NEEDLE ASPIRATION (FNA) LINEAR;  Surgeon: Milus Banister, MD;  Location: [facility] ENDOSCOPY;  Service: Endoscopy;  Laterality: N/A;  . FLEXIBLE SIGMOIDOSCOPY N/A 12/12/2018   Procedure: FLEXIBLE SIGMOIDOSCOPY;  Surgeon: Ileana Roup, MD;  Location: [facility]S;  Service: General;  Laterality: N/A;  . LARYNGOSCOPY  06-27-2004   BX VOCAL CORD  (LEUKOPLAKIA)  PER PT NO ISSUES SINCE  . LEFT HEART CATH AND CORONARY ANGIOGRAPHY N/A 08/20/2017   Procedure: LEFT HEART CATH AND CORONARY ANGIOGRAPHY;  Surgeon: Larey Dresser, MD;  Location: [place] CV LAB;  Service: Cardiovascular;  Laterality: N/A;  . LOWER EXTREMITY ANGIOGRAM Bilateral 08/29/2015   Procedure: Lower Extremity Angiogram;  Surgeon: Angelia Mould, MD;  Location: [place] CV LAB;  Service: Cardiovascular;  Laterality: Bilateral;  . LUNG LOBECTOMY  01/24/2011    RIGHT UPPER LOBE  (SQUAMOUS CELL CARCINOMA) Dr Dorthea Cove , [facility]. No chemotherapyor radiation  . PATCH ANGIOPLASTY Right 07/14/2013   Procedure: PATCH ANGIOPLASTY;  Surgeon: Angelia Mould, MD;  Location: [place];  Service: Vascular;  Laterality: Right;  . PATCH ANGIOPLASTY Left 08/30/2015   Procedure: VEIN PATCH ANGIOPLASTY OF PROXIMAL Left BYPASS GRAFT;  Surgeon: Harrell Gave  Nicole Cella, MD;  Location: [place];  Service: Vascular;  Laterality: Left;  . PERIPHERAL VASCULAR CATHETERIZATION N/A 08/29/2015   Procedure: Abdominal Aortogram;  Surgeon: Angelia Mould, MD;  Location: [place] CV LAB;  Service: Cardiovascular;  Laterality: N/A;  . POLYPECTOMY  06/25/2018   Procedure: POLYPECTOMY;  Surgeon: Rush Landmark Telford Nab., MD;  Location: [place];  Service: Gastroenterology;;  . Lia Foyer INJECTION  06/25/2018   Procedure: SUBMUCOSAL INJECTION;  Surgeon: Irving Copas., MD;  Location: [place];  Service: Gastroenterology;;  . TEE WITHOUT CARDIOVERSION N/A 02/14/2018   Procedure: TRANSESOPHAGEAL ECHOCARDIOGRAM (TEE);  Surgeon: Larey Dresser, MD;  Location: [facility] ENDOSCOPY;  Service: Cardiovascular;  Laterality: N/A;  . trabecular surgery     OS  . TRANSTHORACIC ECHOCARDIOGRAM  12-29-2012  DR [facility]   MILD LVH/  LVSF NORMAL/ EF 44-03%/  GRADE I DIASTOLIC DYSFUNCTION     Current Outpatient Medications  Medication Sig Dispense Refill  . acetaminophen (TYLENOL) 325 MG tablet Take 650 mg every 6 (six) hours as needed by mouth for moderate pain or headache.    . albuterol (PROVENTIL HFA;VENTOLIN HFA) 108 (90 Base) MCG/ACT inhaler Inhale 2 puffs into the lungs every 6 (six) hours as needed for wheezing or shortness of breath. (Patient not taking: Reported on 12/30/2018) 1 Inhaler 0  . amLODipine (NORVASC) 5 MG tablet Take 1 tablet (5 mg total) by mouth daily. 30 tablet 11  .  bimatoprost (LUMIGAN) 0.01 % SOLN Place 1 drop at bedtime into both eyes.     . budesonide-formoterol (SYMBICORT) 160-4.5 MCG/ACT inhaler Inhale 2 puffs into the lungs 2 (two) times daily.     [name] docusate sodium (COLACE) 100 MG capsule Take 100 mg by mouth daily.     . dorzolamide-timolol (COSOPT) 22.3-6.8 MG/ML ophthalmic solution Place 2 drops into both eyes 2 (two) times daily.     . ferrous sulfate 325 (65 FE) MG tablet Take 325 mg by mouth at bedtime.     [name] FLUoxetine (PROZAC) 40 MG capsule Take 1 capsule (40 mg total) by mouth daily. 30 capsule 0  . furosemide (LASIX) 20 MG tablet Take 3 tabs (60 mg) for 3 days, then 2 tabs (40 mg) daily. (Patient taking differently: Take 40 mg by mouth daily. ) 75 tablet 3  . Magnesium 500 MG CAPS Take 500 mg by mouth daily.     . Melatonin 5 MG TABS Take 5 mg by mouth at bedtime.    . methimazole (TAPAZOLE) 5 MG tablet Take 1 tablet (5 mg total) by mouth daily. 45 tablet 3  . metoprolol succinate (TOPROL-XL) 25 MG 24 hr tablet Take 1 tablet (25 mg total) by mouth daily. Take with or immediately following a meal. 90 tablet 3  . montelukast (SINGULAIR) 10 MG tablet TAKE ONE TABLET AT BEDTIME. (Patient taking differently: Take 10 mg by mouth at bedtime. ) 30 tablet 2  . omeprazole (PRILOSEC) 20 MG capsule Take 1 capsule (20 mg total) by mouth daily. 90 capsule 3  . Polyethyl Glycol-Propyl Glycol (SYSTANE) 0.4-0.3 % SOLN Place 1 drop into both eyes daily as needed (for dry eyes).     . potassium chloride SA (K-DUR,KLOR-CON) 20 MEQ tablet Take 1 tablet (20 mEq total) by mouth daily. 30 tablet 11  . rosuvastatin (CRESTOR) 40 MG tablet Take 1 tablet (40 mg total) by mouth daily. 30 tablet 11  . senna (SENOKOT) 8.6 MG tablet Take 1 tablet by mouth 2 (two) times daily.  No current facility-administered medications for this encounter.     Allergies:   Niacin-lovastatin er; Penicillins; Sulfonamide derivatives; and Atorvastatin   Social History:  The patient   reports that he quit smoking about 4 years ago. His smoking use included cigarettes. He has a 25.00 pack-year smoking history. He has never used smokeless tobacco. He reports current alcohol use. He reports that he does not use drugs.   Family History:  The patient's family history includes Alcohol abuse in his father; Cancer (age of onset: 66) in his daughter; Heart attack in his father; Heart disease in his father, paternal aunt, and paternal uncle; Hypertension in his father and paternal aunt; Stroke in his father, mother, paternal aunt, and paternal uncle.   ROS:  Please see the history of present illness.   All other systems are personally reviewed and negative.   Exam:  Tele Health Call; Exam is subjective General:  Speaks in full sentences. No resp difficulty.  Lungs: Normal respiratory effort with conversation.  Abdomen: Non-distended per patient report Extremities: Pt denies edema. Neuro: Alert & oriented x 3.   Recent Labs: 07/14/2018: Pro B Natriuretic peptide (BNP) 279.0 11/18/2018: B Natriuretic Peptide 387.8 11/27/2018: TSH 3.57 12/03/2018: ALT 26 12/14/2018: Hemoglobin 10.6; Platelets 260 12/16/2018: BUN 13; Creatinine, Ser 0.99; Magnesium 2.0; Potassium 4.0; Sodium 137  Personally reviewed   Wt Readings from Last 3 Encounters:  12/19/18 83.4 kg (183 lb 13.8 oz)  12/03/18 85 kg (187 lb 8 oz)  11/27/18 85.3 kg (188 lb)    Vitals:   01/26/19 1435  BP: 125/65  Pulse: (!) 58     ASSESSMENT AND PLAN:  1. Chronic diastolic CHF: TEE in 7/10 showed LV EF 55-60% with normal RV size and systolic function. NYHA II-III.  Volume status sounds stable. Continue current dose of diuretic.  2. Hyperlipidemia:  - Continue Crestor. 3. Carotid stenosis: s/p R CEA.  Dopplers followed at [facility]. No change.  4. PAD: s/p patch angioplasty to left fem-pop bypass in 11/16.  Followed at [facility]. He is off cilostazol with CHF but his claudication has actually improved over time.  No change.  5. AAA:  Stable on last [name], followed at [facility].   No change.  6. COPD: No longer smoking. PFTs in 1/18 showed moderately severe obstructive defect. CT chest in 11/18 showed emphysema. Lung sounds much improved. No obvious wheeze on exam. Decreased sounds in bases, R>L.   7. Atrial fibrillation: He is symptomatic when in atrial fibrillation.  Atrial fibrillation seems to worsen his CHF. He is off anticoagulation due to bleeding sigmoid adenocarcinoma. He will not be able to restart until he has had the sigmoid mass removed.  Additionally, TSH was low when checked in 10/19 concerning for amiodarone-related hyperthyroidism, and there was also concern for amiodarone lung toxicity. He is now off amiodarone.  I personally discussed and reviewed with Chanetta Marshall NP for EP. He is in A fib 23% of the time. I suspect he is going in and out of A fib.  Today we will restart eliquis 5 mg twice a day. I will refer him to A fib clinic for an appointment this week.  Check CBC at that time.  - Continue Toprol XL 25 mg daily.  - Continue to limit ETOH.  - Continue CPAP. Continue to use CPAP.  8. . CKD: Stage 3 9. CAD: LHC in 11/18 with nonobstructive disease only.   - No s/s ischemia 10. 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  Continue methimazole and  follows with endocrinology.  11. Colon cancer: Sigmoid adenocarcinoma. S/P Colectomy 11/2018    12. HTN: Stable.    COVID screen The patient does not have any symptoms that suggest any further testing/ screening at this time.  Social distancing reinforced today.  Patient Risk: After full review of this patients clinical status, I feel that they are at moderate risk for cardiac decompensation at this time.  Relevant cardiac medications were reviewed at length with the patient today. The patient does not have concerns regarding their medications at this time.   The following changes were made today:  Restart eliquis 5 mg twice a day. We have sent to [facility].   Recommended follow-up:  6 weeks with Dr Aundra Dubin. Refer to the A fib clinic today for an appointment.   Today, I have spent 25  minutes with the patient with telehealth technology discussing the above issues .    Jeanmarie Hubert, NP  01/26/2019 12:54 PM  [place] [address] Heart and [place] [address] [phone] (office) [phone] (fax)

## 2019-01-26 NOTE — Patient Instructions (Addendum)
Prescription for Eliquis 5 mg Twice daily was sent to [facility] for you  The A-fib clinic will call you for an appointment this week  Our office will call you to schedule a follow up appointment with Dr Aundra Dubin in 6 weeks.

## 2019-01-26 NOTE — Progress Notes (Signed)
AVS sent to pt via mychart.  Message sent to a-fib clinic for appt this week

## 2019-01-27 ENCOUNTER — Telehealth: Payer: Self-pay | Admitting: Internal Medicine

## 2019-01-27 NOTE — Telephone Encounter (Signed)
Please advise 

## 2019-01-27 NOTE — Telephone Encounter (Signed)
Patients wife has called stating that her husband is in and out of avid and stated they go and see the heart doctor on Thursday and would like to know if can start taking amiodarone again.  Please Advise, Thanks

## 2019-01-28 NOTE — Telephone Encounter (Signed)
Called pt and made aware. States would rather wait and speak with the cardiologist first before starting the medication. Further states, would prefer cardiologist order and manage.

## 2019-01-28 NOTE — Telephone Encounter (Signed)
This is per cardiology. If he needs Amiodarone again, he can start it and we will have to keep a close eye on his thyroid tests. If he is started on this, need to check a TSH, free T4 and free T3 in 4 weeks. I will order, but please tell him to send me a msg through MyChart if he starts it after tomorrow's appt.

## 2019-01-28 NOTE — Telephone Encounter (Signed)
Oh, absolutely, he SHOULD NOT start this without being instructed as such per cardiology

## 2019-01-29 ENCOUNTER — Ambulatory Visit (HOSPITAL_COMMUNITY)
Admission: RE | Admit: 2019-01-29 | Discharge: 2019-01-29 | Disposition: A | Discharge status: Home / Self Care | Payer: Commercial Managed Care - PPO | Source: Ambulatory Visit | Attending: Nurse Practitioner | Admitting: Nurse Practitioner

## 2019-01-29 ENCOUNTER — Other Ambulatory Visit: Payer: Self-pay

## 2019-01-29 ENCOUNTER — Encounter (HOSPITAL_COMMUNITY): Payer: Self-pay | Admitting: Nurse Practitioner

## 2019-01-29 VITALS — BP 130/68 | HR 65 | Ht 73.0 in | Wt 189.8 lb

## 2019-01-29 DIAGNOSIS — Z880 Allergy status to penicillin: Secondary | ICD-10-CM | POA: Insufficient documentation

## 2019-01-29 DIAGNOSIS — I480 Paroxysmal atrial fibrillation: Secondary | ICD-10-CM

## 2019-01-29 DIAGNOSIS — Z803 Family history of malignant neoplasm of breast: Secondary | ICD-10-CM | POA: Insufficient documentation

## 2019-01-29 DIAGNOSIS — Z8249 Family history of ischemic heart disease and other diseases of the circulatory system: Secondary | ICD-10-CM | POA: Insufficient documentation

## 2019-01-29 DIAGNOSIS — I739 Peripheral vascular disease, unspecified: Secondary | ICD-10-CM | POA: Insufficient documentation

## 2019-01-29 DIAGNOSIS — Z882 Allergy status to sulfonamides status: Secondary | ICD-10-CM | POA: Diagnosis not present

## 2019-01-29 DIAGNOSIS — I130 Hypertensive heart and chronic kidney disease with heart failure and stage 1 through stage 4 chronic kidney disease, or unspecified chronic kidney disease: Secondary | ICD-10-CM | POA: Diagnosis not present

## 2019-01-29 DIAGNOSIS — E0590 Thyrotoxicosis, unspecified without thyrotoxic crisis or storm: Secondary | ICD-10-CM | POA: Insufficient documentation

## 2019-01-29 DIAGNOSIS — R9431 Abnormal electrocardiogram [ECG] [EKG]: Secondary | ICD-10-CM | POA: Insufficient documentation

## 2019-01-29 DIAGNOSIS — K219 Gastro-esophageal reflux disease without esophagitis: Secondary | ICD-10-CM | POA: Diagnosis not present

## 2019-01-29 DIAGNOSIS — G4733 Obstructive sleep apnea (adult) (pediatric): Secondary | ICD-10-CM | POA: Insufficient documentation

## 2019-01-29 DIAGNOSIS — J449 Chronic obstructive pulmonary disease, unspecified: Secondary | ICD-10-CM | POA: Insufficient documentation

## 2019-01-29 DIAGNOSIS — N183 Chronic kidney disease, stage 3 (moderate): Secondary | ICD-10-CM | POA: Diagnosis not present

## 2019-01-29 DIAGNOSIS — Z85828 Personal history of other malignant neoplasm of skin: Secondary | ICD-10-CM | POA: Insufficient documentation

## 2019-01-29 DIAGNOSIS — Z7901 Long term (current) use of anticoagulants: Secondary | ICD-10-CM | POA: Insufficient documentation

## 2019-01-29 DIAGNOSIS — I5032 Chronic diastolic (congestive) heart failure: Secondary | ICD-10-CM | POA: Diagnosis not present

## 2019-01-29 DIAGNOSIS — Z87891 Personal history of nicotine dependence: Secondary | ICD-10-CM | POA: Insufficient documentation

## 2019-01-29 DIAGNOSIS — Z888 Allergy status to other drugs, medicaments and biological substances status: Secondary | ICD-10-CM | POA: Diagnosis not present

## 2019-01-29 DIAGNOSIS — H409 Unspecified glaucoma: Secondary | ICD-10-CM | POA: Diagnosis not present

## 2019-01-29 DIAGNOSIS — I700 Atherosclerosis of aorta: Secondary | ICD-10-CM | POA: Diagnosis not present

## 2019-01-29 DIAGNOSIS — Z85038 Personal history of other malignant neoplasm of large intestine: Secondary | ICD-10-CM | POA: Diagnosis not present

## 2019-01-29 DIAGNOSIS — Z883 Allergy status to other anti-infective agents status: Secondary | ICD-10-CM | POA: Insufficient documentation

## 2019-01-29 DIAGNOSIS — Z79899 Other long term (current) drug therapy: Secondary | ICD-10-CM | POA: Insufficient documentation

## 2019-01-29 LAB — CBC
HCT: 40.4 % (ref 39.0–52.0)
Hemoglobin: 13 g/dL (ref 13.0–17.0)
MCH: 30.7 pg (ref 26.0–34.0)
MCHC: 32.2 g/dL (ref 30.0–36.0)
MCV: 95.3 fL (ref 80.0–100.0)
Platelets: 352 10*3/uL (ref 150–400)
RBC: 4.24 MIL/uL (ref 4.22–5.81)
RDW: 15.5 % (ref 11.5–15.5)
WBC: 6.4 10*3/uL (ref 4.0–10.5)
nRBC: 0 % (ref 0.0–0.2)

## 2019-01-29 LAB — TSH: TSH: 11.765 u[IU]/mL — ABNORMAL HIGH (ref 0.350–4.500)

## 2019-01-29 LAB — T4, FREE: Free T4: 0.7 ng/dL — ABNORMAL LOW (ref 0.82–1.77)

## 2019-01-29 NOTE — Progress Notes (Signed)
Primary Care Physician: Binnie Rail, MD Referring Physician: Darrick Grinder, NP/[name] EP: Dr. [name] is a 78 y.o. male with a h/o HTN, COPD,  carotid stenosis s/p right CEA, paroxysmal atrial fibrillation, and PAD, obstructive CAD, returns for followup of CHF and atrial fibrillation.He had a left fem-pop bypass at the [facility] in the '90s. He is followed at [facility] for PAD. He has chronic right calf/thigh/buttocks claudication that is unchanged over the last few years and follows regularly at [facility]. He has had right lower lobectomy for lung cancer. He continues to stay off cigarettes. He had been on amiodarone until last October when he was stopped for hyperthyroidism and possible lung changes.  In 06/19/18, he was admitted with GI bleeding and found to have a sigmoid mass on colonoscopy, path showed sigmoid adenocarcinoma. He was noted to have right paratracheal lymphadenopathy but EUS with biopsy in 10/19 did not show malignancy. Eliquis was stopped with bleeding sigmoid mass. He was noted to be back in atrial fibrillation that admission.   He had a virtual appointment with Darrick Grinder, NP, 4/27 and Chanetta Marshall, NP, had relayed that his LinQ revealed 23% afib burden. Pt had noted some increased fatigue and elevated heart rates at home. He was started back on his eliquis 5 mg bid and set up for this visit today. His is on toprol 25 mg daily.  Today he is in afib, rate controlled. He is back on eliquis since last Friday. No rectal bleeding noted. He is now about 6 weeks post rectal surgery. He is not aware of the afib today but the wife can tell when he is in afib because of extra fatigue. When I reviewed Dr. Jackalyn Lombard note from last June he offered the pt an ablation but he deferred at that time.His weight is stable.By last echo left atrium is normal size.By last endocrinologist note his last thyroid panel was normal but sine it was a virtual visit, she states that it would be  good to get thyroid labs when able.  Today, he denies symptoms of palpitations, chest pain, shortness of breath, orthopnea, PND, lower extremity edema, dizziness, presyncope, syncope, or neurologic sequela. The patient is tolerating medications without difficulties and is otherwise without complaint today.   Past Medical History:  Diagnosis Date  . AAA (abdominal aortic aneurysm) ([place]) LAST ABDOMINAL US 10-20-17 3.3 CM   MONITORED BY DR [name]  . Anxiety   . Arthritis   . Atrial fibrillation ([place])   . Basal cell carcinoma   . Bilateral carotid artery stenosis DUPLEX 12-29-2012  BY DR [facility]   BILATERAL ICA STENOSIS 60-79%  . BPH (benign prostatic hypertrophy)   . Chronic diastolic heart failure ([place])   . Chronic kidney disease    stage 3, pt unaware  . Colon cancer ([place])   . Complication of anesthesia    had nausea after carotid artery surgery, states the medicine given to help the nausea, made it worse  . COPD (chronic obstructive pulmonary disease) ([place])   . Depression   . GERD (gastroesophageal reflux disease)   . Glaucoma BOTH EYES   Dr Gershon Crane  . History of basal cell carcinoma excision   . History of lung cancer APRIL 2012  SQUAMOUS CELL---- S/P RIGHT LOWER LOBECTOMY AT [facility] --  NO CHEMORADIATION---  NO RECURRENCE    ONCOLOGIST- DR Tressie Stalker  LOV IN [facility] 10-27-2012  . History of pneumothorax    pt unaware  . Hx  of adenomatous colonic polyps 2005    X 2; 1 hyperplastic polyp; Dr Olevia Perches  . Hyperlipidemia   . Hypertension   . Impaired fasting glucose 2007   108; A1c5.4%  . Lesion of bladder   . Lung cancer ([place]) 01/25/2012  . Microhematuria   . OSA (obstructive sleep apnea) 08/29/2015   CPAP SET ON 10  . PAD (peripheral artery disease) ([place]) ABI'S  JAN 2014  0.65 ON RIGHT ;  1.04 ON LEFT  . Peripheral vascular disease ([name]) S/P ANGIOPLASTY AND STENTING   FOLLOWED  BY DR [name]  . Pleural effusion on right 11/18/2018   Chronic, noted on CXR  . Spinal stenosis Sept.  2015  . Status post placement of implantable loop recorder   . Stroke [facility]) Jul 07, 2013   mini  TIA  . Thoracic aorta atherosclerosis ([place])   . Thyrotoxicosis    amiodarone induced  . Urgency of urination    Past Surgical History:  Procedure Laterality Date  . ANGIO PLASTY     X 4 in legs  . AORTOGRAM  07-27-2002   MILD DIFFUSE ILIAC ARTERY OCCLUSIVE DISEASE /  LEFT RENAL ARTERY 20%/ PATENT LEFT FEM-POP GRAFT/ MILD SFA AND POPLITEAL ARTERY OCCLUSIVE DISEASE W/ SEVERE KIDNEY OCCLUSIVE DISEASE  . BASAL CELL CARCINOMA EXCISION     MULTIPLE TIMES--  RIGHT FOREARM, CHEEKS, AND BACK  . BIOPSY  06/25/2018   Procedure: BIOPSY;  Surgeon: Rush Landmark Telford Nab., MD;  Location: [place];  Service: Gastroenterology;;  . CARDIOVASCULAR STRESS TEST  12-08-2012  DR MCLEAN   NORMAL LEXISCAN WITH NO EXERCISE NUCLEAR STUDY/ EF 66%/   NO ISCHEMIA/ NO SIGNIFICANT CHANGE FROM PRIOR STUDY  . CARDIOVERSION N/A 02/14/2018   Procedure: CARDIOVERSION;  Surgeon: Larey Dresser, MD;  Location: [facility] ENDOSCOPY;  Service: Cardiovascular;  Laterality: N/A;  . CAROTID ANGIOGRAM N/A 07/10/2013   Procedure: CAROTID ANGIOGRAM;  Surgeon: Elam Dutch, MD;  Location: [facility] CATH LAB;  Service: Cardiovascular;  Laterality: N/A;  . CAROTID ENDARTERECTOMY Right 07-14-13   cea  . CATARACT EXTRACTION W/ INTRAOCULAR LENS  IMPLANT, BILATERAL    . colon polyectomy    . COLONOSCOPY WITH PROPOFOL N/A 06/25/2018   Procedure: COLONOSCOPY WITH PROPOFOL;  Surgeon: Rush Landmark Telford Nab., MD;  Location: [place];  Service: Gastroenterology;  Laterality: N/A;  . CYSTOSCOPY W/ RETROGRADES Bilateral 01/21/2013   Procedure: CYSTOSCOPY WITH RETROGRADE PYELOGRAM;  Surgeon: Molli Hazard, MD;  Location: [facility];  Service: Urology;  Laterality: Bilateral;   CYSTO, BLADDER BIOPSY, BILATERAL RETROGRADE PYELOGRAM  RAD TECH FROM RADIOLOGY PER JOY  . CYSTOSCOPY WITH BIOPSY N/A 01/21/2013   Procedure: CYSTOSCOPY  WITH BIOPSY;  Surgeon: Molli Hazard, MD;  Location: [facility];  Service: Urology;  Laterality: N/A;  . ENDARTERECTOMY Right 07/14/2013   Procedure: ENDARTERECTOMY CAROTID;  Surgeon: Angelia Mould, MD;  Location: [place];  Service: Vascular;  Laterality: Right;  . EP IMPLANTABLE DEVICE N/A 08/12/2015   Procedure: Loop Recorder Insertion;  Surgeon: Thompson Grayer, MD;  Location: [place] CV LAB;  Service: Cardiovascular;  Laterality: N/A;  . ESOPHAGOGASTRODUODENOSCOPY (EGD) WITH PROPOFOL N/A 06/25/2018   Procedure: ESOPHAGOGASTRODUODENOSCOPY (EGD) WITH PROPOFOL;  Surgeon: Rush Landmark Telford Nab., MD;  Location: [place];  Service: Gastroenterology;  Laterality: N/A;  . ESOPHAGOGASTRODUODENOSCOPY (EGD) WITH PROPOFOL N/A 07/10/2018   Procedure: ESOPHAGOGASTRODUODENOSCOPY (EGD) WITH PROPOFOL;  Surgeon: Milus Banister, MD;  Location: [facility] ENDOSCOPY;  Service: Endoscopy;  Laterality: N/A;  . EUS N/A 07/10/2018  Procedure: UPPER ENDOSCOPIC ULTRASOUND (EUS) RADIAL;  Surgeon: Milus Banister, MD;  Location: [facility] ENDOSCOPY;  Service: Endoscopy;  Laterality: N/A;  . EYE SURGERY Right   . FEMORAL-POPLITEAL BYPASS GRAFT Left [facility]   AND 2001  IN [place]  . FEMORAL-POPLITEAL BYPASS GRAFT Left 08/30/2015   Procedure: REVISION OF BYPASS GRAFT Left  FEMORAL-POPLITEAL ARTERY;  Surgeon: Angelia Mould, MD;  Location: [place];  Service: Vascular;  Laterality: Left;  . FINE NEEDLE ASPIRATION N/A 07/10/2018   Procedure: FINE NEEDLE ASPIRATION (FNA) LINEAR;  Surgeon: Milus Banister, MD;  Location: [facility] ENDOSCOPY;  Service: Endoscopy;  Laterality: N/A;  . FLEXIBLE SIGMOIDOSCOPY N/A 12/12/2018   Procedure: FLEXIBLE SIGMOIDOSCOPY;  Surgeon: Ileana Roup, MD;  Location: [facility] ORS;  Service: General;  Laterality: N/A;  . LARYNGOSCOPY  06-27-2004   BX VOCAL CORD  (LEUKOPLAKIA)  PER PT NO ISSUES SINCE  . LEFT HEART CATH AND CORONARY ANGIOGRAPHY N/A 08/20/2017    Procedure: LEFT HEART CATH AND CORONARY ANGIOGRAPHY;  Surgeon: Larey Dresser, MD;  Location: [place] CV LAB;  Service: Cardiovascular;  Laterality: N/A;  . LOWER EXTREMITY ANGIOGRAM Bilateral 08/29/2015   Procedure: Lower Extremity Angiogram;  Surgeon: Angelia Mould, MD;  Location: [place] CV LAB;  Service: Cardiovascular;  Laterality: Bilateral;  . LUNG LOBECTOMY  01/24/2011    RIGHT UPPER LOBE  (SQUAMOUS CELL CARCINOMA) Dr Dorthea Cove , [facility]. No chemotherapyor radiation  . PATCH ANGIOPLASTY Right 07/14/2013   Procedure: PATCH ANGIOPLASTY;  Surgeon: Angelia Mould, MD;  Location: [place];  Service: Vascular;  Laterality: Right;  . PATCH ANGIOPLASTY Left 08/30/2015   Procedure: VEIN PATCH ANGIOPLASTY OF PROXIMAL Left BYPASS GRAFT;  Surgeon: Angelia Mould, MD;  Location: [place];  Service: Vascular;  Laterality: Left;  . PERIPHERAL VASCULAR CATHETERIZATION N/A 08/29/2015   Procedure: Abdominal Aortogram;  Surgeon: Angelia Mould, MD;  Location: [place] CV LAB;  Service: Cardiovascular;  Laterality: N/A;  . POLYPECTOMY  06/25/2018   Procedure: POLYPECTOMY;  Surgeon: Rush Landmark Telford Nab., MD;  Location: [place];  Service: Gastroenterology;;  . Lia Foyer INJECTION  06/25/2018   Procedure: SUBMUCOSAL INJECTION;  Surgeon: Irving Copas., MD;  Location: [place];  Service: Gastroenterology;;  . TEE WITHOUT CARDIOVERSION N/A 02/14/2018   Procedure: TRANSESOPHAGEAL ECHOCARDIOGRAM (TEE);  Surgeon: Larey Dresser, MD;  Location: [facility] ENDOSCOPY;  Service: Cardiovascular;  Laterality: N/A;  . trabecular surgery     OS  . TRANSTHORACIC ECHOCARDIOGRAM  12-29-2012  DR [facility]   MILD LVH/  LVSF NORMAL/ EF 19-14%/  GRADE I DIASTOLIC DYSFUNCTION    Current Outpatient Medications  Medication Sig Dispense Refill  . acetaminophen (TYLENOL) 325 MG tablet Take 650 mg every 6 (six) hours as needed by mouth for moderate pain or headache.    [name] amLODipine (NORVASC)  5 MG tablet Take 1 tablet (5 mg total) by mouth daily. 30 tablet 11  . apixaban (ELIQUIS) 5 MG TABS tablet Take 1 tablet (5 mg total) by mouth 2 (two) times daily. 60 tablet 6  . bimatoprost (LUMIGAN) 0.01 % SOLN Place 1 drop at bedtime into both eyes.     . budesonide-formoterol (SYMBICORT) 160-4.5 MCG/ACT inhaler Inhale 2 puffs into the lungs 2 (two) times daily.     [name] docusate sodium (COLACE) 100 MG capsule Take 100 mg by mouth daily.     . dorzolamide-timolol (COSOPT) 22.3-6.8 MG/ML ophthalmic solution Place 2 drops into both eyes 2 (two) times daily.     [name]  ferrous sulfate 325 (65 FE) MG tablet Take 325 mg by mouth at bedtime.     [name] FLUoxetine (PROZAC) 40 MG capsule Take 1 capsule (40 mg total) by mouth daily. 30 capsule 0  . furosemide (LASIX) 20 MG tablet Take 3 tabs (60 mg) for 3 days, then 2 tabs (40 mg) daily. (Patient taking differently: Take 40 mg by mouth daily. ) 75 tablet 3  . Magnesium 500 MG CAPS Take 500 mg by mouth daily.     . Melatonin 5 MG TABS Take 5 mg by mouth at bedtime.    . methimazole (TAPAZOLE) 5 MG tablet Take 1 tablet (5 mg total) by mouth daily. 45 tablet 3  . metoprolol succinate (TOPROL-XL) 25 MG 24 hr tablet Take 1 tablet (25 mg total) by mouth daily. Take with or immediately following a meal. 90 tablet 3  . montelukast (SINGULAIR) 10 MG tablet TAKE ONE TABLET AT BEDTIME. 30 tablet 2  . omeprazole (PRILOSEC) 20 MG capsule Take 1 capsule (20 mg total) by mouth daily. 90 capsule 3  . Polyethyl Glycol-Propyl Glycol (SYSTANE) 0.4-0.3 % SOLN Place 1 drop into both eyes daily as needed (for dry eyes).     . potassium chloride SA (K-DUR,KLOR-CON) 20 MEQ tablet Take 1 tablet (20 mEq total) by mouth daily. 30 tablet 11  . rosuvastatin (CRESTOR) 40 MG tablet TAKE 1 TABLET BY MOUTH DAILY. 90 tablet 0  . senna (SENOKOT) 8.6 MG tablet Take 1 tablet by mouth 2 (two) times daily.    [name] albuterol (PROVENTIL HFA;VENTOLIN HFA) 108 (90 Base) MCG/ACT inhaler Inhale 2 puffs into the  lungs every 6 (six) hours as needed for wheezing or shortness of breath. 1 Inhaler 0   No current facility-administered medications for this encounter.     Allergies  Allergen Reactions  . Niacin-Lovastatin Er Shortness Of Breath and Other (See Comments)    Dyspnea, flushing  . Penicillins Hives, Shortness Of Breath and Other (See Comments)    Flushing  & dyspnea Because of a history of documented adverse serious drug reaction;Medi Alert bracelet  is recommended PCN reaction causing immediate rash, facial/tongue/throat swelling, SOB or lightheadedness with hypotension: Yes PCN reaction causing severe rash involving mucus membranes or skin necrosis: No PCN reaction occurring within the last 10 years: NO PCN reaction that required hospitalization: NO  . Sulfonamide Derivatives Hives, Shortness Of Breath and Other (See Comments)    Flushing & dyspnea Because of a history of documented adverse serious drug reaction;Medi Alert bracelet  is recommended  . Atorvastatin Other (See Comments)    Myalgias & athralgias- takes Crestor, DOES NOT LIKE LIPITOR    Social History   Socioeconomic History  . Marital status: Married    Spouse name: [name]   . Number of children: 3  . Years of education: 12+  . Highest education level: Not on file  Occupational History  . 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 Financial resource strain: Not on file  . Food insecurity:    Worry: Not on file    Inability: Not on file  . Transportation needs:    Medical: Not on file    Non-medical: Not on file  Tobacco Use  . Smoking status: Former Smoker    Packs/day: 0.50    Years: 50.00    Pack years: 25.00    Types: Cigarettes  Last attempt to quit: 07/28/2014    Years since quitting: 4.5  . Smokeless tobacco: Never Used  Substance and Sexual Activity  . Alcohol use:  Yes    Alcohol/week: 0.0 standard drinks    Comment:  socially, variable  . Drug use: No  . Sexual activity: Not on file  Lifestyle  . Physical activity:    Days per week: Not on file    Minutes per session: Not on file  . Stress: Not on file  Relationships  . Social connections:    Talks on phone: Not on file    Gets together: Not on file    Attends religious service: Not on file    Active member of club or organization: Not on file    Attends meetings of clubs or organizations: Not on file    Relationship status: Not on file  . Intimate partner violence:    Fear of current or ex partner: Not on file    Emotionally abused: Not on file    Physically abused: Not on file    Forced sexual activity: Not on file  Other Topics Concern  . Not on file  Social History Narrative   Patient lives at home with spouse [name]   Patient has 3 children    Patient is right handed     Family History  Problem Relation Age of Onset  . Stroke Mother        mini strokes  . Alcohol abuse Father   . Heart disease Father        MI after 86  . Stroke Father   . Hypertension Father   . Heart attack Father   . Heart disease Paternal Aunt        several  . Hypertension Paternal Aunt        several  . Stroke Paternal Aunt        several  . Stroke Paternal Uncle        several  . Heart disease Paternal Uncle        several;2 had MI pre 74  . Cancer Daughter 32       breast ca, also with benign sessile polyp     ROS- All systems are reviewed and negative except as per the HPI above  Physical Exam: Vitals:   01/29/19 1006  BP: 130/68  Pulse: 65  Weight: 86.1 kg  Height: 6\' 1"  (1.854 m)   Wt Readings from Last 3 Encounters:  01/29/19 86.1 kg  12/19/18 83.4 kg  12/03/18 85 kg    Labs: Lab Results  Component Value Date   NA 137 12/16/2018   K 4.0 12/16/2018   CL 103 12/16/2018   CO2 25 12/16/2018   GLUCOSE 89 12/16/2018   BUN 13 12/16/2018   CREATININE 0.99 12/16/2018    CALCIUM 8.8 (L) 12/16/2018   PHOS 3.3 12/16/2018   MG 2.0 12/16/2018   Lab Results  Component Value Date   INR 1.0 12/03/2018   Lab Results  Component Value Date   CHOL 105 10/25/2017   HDL 35 (L) 10/25/2017   LDLCALC 50 10/25/2017   TRIG 101 10/25/2017     GEN- The patient is well appearing, alert and oriented x 3 today. Wearing a mask today.   Head- normocephalic, atraumatic Eyes-  Sclera clear, conjunctiva pink Ears- hearing intact Oropharynx- clear Neck- supple, no JVP Lymph- no cervical lymphadenopathy Lungs- Clear to ausculation bilaterally, normal work of breathing Heart- irregular rate and rhythm,  no murmurs, rubs or gallops, PMI not laterally displaced GI- soft, NT, ND, + BS Extremities- no clubbing, cyanosis, or edema MS- no significant deformity or atrophy Skin- no rash or lesion Psych- euthymic mood, full affect Neuro- strength and sensation are intact  EKG-afib at 65 bpm, qrs int 78 ms, qtc 409 ms Device notes reviewed Epic records reviewed Echo-01/2018-Study Conclusions  - Procedure narrative: Transthoracic echocardiography. Image   quality was suboptimal. The study was technically difficult, as a   result of poor acoustic windows and poor sound wave transmission.   Intravenous contrast (Definity) was administered. - Left ventricle: Wall thickness was increased in a pattern of   moderate LVH. Systolic function was normal. The estimated   ejection fraction was in the range of 55% to 60%. - Mitral valve: Calcified annulus. Mildly thickened leaflets . - Impressions: Overall poor image quality.  Impressions:  - Overall poor image quality. Left atrium normal size  Assessment and Plan: 1. Paroxysmal afib Per device clinic, he has 23% afib burden Other than some fatigue, he does not seem to be too symptomatic Continue toprol 25 mg daily, will  not increase dose as he has controlled v rates in the mid 60's today I think his options are tikosyn or  ablation Per Dr.Allred's last note 03/2018, he offered ablation, pt deferred I will send my note to [name] to see if he still feels the pt may be best served with ablation, when covid 19 restrictions relaxed  2. CHA2DS2VASc score of at least 6 He has restarted eliquis 5 mg bid since last Friday Continue without missing doses Watch for rectal bleeding  Will repeat CBC today  3. Hyperthyroidism Per Dr. Cruzita Lederer last virtual note, wanted repeat thyroid panel when able Will draw today  Would avoid amiodarone going forward  I will forward my note to [name] for best path forward He has f/u with [name] in 6 weeks  Geroge Baseman. Adalina Dopson, [facility] [address] [place], [place] [address] [phone]

## 2019-01-30 LAB — T3: T3, Total: 93 ng/dL (ref 71–180)

## 2019-02-03 ENCOUNTER — Telehealth: Payer: Self-pay | Admitting: *Deleted

## 2019-02-03 NOTE — Telephone Encounter (Signed)
Spoke with patient's wife, Troy [name] ([place]), regarding LINQ at [facility] as of 02/02/19. Discussed options at this time. Pt's wife reports the Troy [name] has been very beneficial in managing pt's AF and she thinks it would be helpful to discuss options with [name]. They do have the ability to do a video visit. Advised I will route this message to Sonia Baller, RN, and Melissa to schedule. Plan to keep [facility] monitor plugged in for now, will await update regarding plan prior to discontinuing monitoring. Pt's wife thanked me for my call and denies questions or concerns at this time.

## 2019-02-04 NOTE — Telephone Encounter (Signed)
Pt has appt to discuss ablation with [name].  Added loop at [facility] to appt notes.

## 2019-02-05 ENCOUNTER — Other Ambulatory Visit: Payer: Self-pay | Admitting: Pulmonary Disease

## 2019-02-05 ENCOUNTER — Other Ambulatory Visit: Payer: Self-pay

## 2019-02-05 ENCOUNTER — Ambulatory Visit (INDEPENDENT_AMBULATORY_CARE_PROVIDER_SITE_OTHER): Payer: Commercial Managed Care - PPO | Admitting: *Deleted

## 2019-02-05 DIAGNOSIS — I480 Paroxysmal atrial fibrillation: Secondary | ICD-10-CM

## 2019-02-06 LAB — CUP PACEART REMOTE DEVICE CHECK
Date Time Interrogation Session: 20200507144136
Implantable Pulse Generator Implant Date: 20161111

## 2019-02-10 ENCOUNTER — Telehealth: Payer: Self-pay

## 2019-02-10 NOTE — Telephone Encounter (Signed)
Left message regarding appt on 02/11/19.

## 2019-02-10 NOTE — Progress Notes (Signed)
Carelink Summary Report / Loop Recorder 

## 2019-02-11 ENCOUNTER — Telehealth (INDEPENDENT_AMBULATORY_CARE_PROVIDER_SITE_OTHER): Payer: Commercial Managed Care - PPO | Admitting: Internal Medicine

## 2019-02-11 ENCOUNTER — Other Ambulatory Visit: Payer: Self-pay

## 2019-02-11 ENCOUNTER — Encounter: Payer: Self-pay | Admitting: Internal Medicine

## 2019-02-11 VITALS — BP 134/56 | HR 61 | Ht 73.0 in | Wt 188.0 lb

## 2019-02-11 DIAGNOSIS — I480 Paroxysmal atrial fibrillation: Secondary | ICD-10-CM

## 2019-02-11 DIAGNOSIS — I10 Essential (primary) hypertension: Secondary | ICD-10-CM | POA: Diagnosis not present

## 2019-02-11 DIAGNOSIS — E0590 Thyrotoxicosis, unspecified without thyrotoxic crisis or storm: Secondary | ICD-10-CM | POA: Diagnosis not present

## 2019-02-11 DIAGNOSIS — I5032 Chronic diastolic (congestive) heart failure: Secondary | ICD-10-CM | POA: Diagnosis not present

## 2019-02-11 DIAGNOSIS — I6523 Occlusion and stenosis of bilateral carotid arteries: Secondary | ICD-10-CM

## 2019-02-11 NOTE — Telephone Encounter (Signed)
° ° °  Please return call to patient °

## 2019-02-11 NOTE — Progress Notes (Signed)
Electrophysiology TeleHealth Note   Due to national recommendations of social distancing due to [place] 19, an audio telehealth visit is felt to be most appropriate for this patient at this time.  Verbal consent was obtained from the patient today.  He was unable to get connected to virtual visit despite multiple efforts and feels that a phone visit is required.   Date:  02/11/2019   ID:  Troy Kenner [name], DOB [birth year], MRN [id number]  Location: patient's home  Provider location: [address] [place]  Evaluation Performed: Follow-up visit  PCP:  Binnie Rail, MD  Cardiologist:   Dr Aundra Dubin Electrophysiologist:  Dr Rayann Heman  Chief Complaint:  afib  History of Present Illness:    Troy Kenner [name] is a 78 y.o. male who presents via audio conferencing for a telehealth visit today.  Since last being seen in our clinic, the patient reports doing reasonably well.  He reports increasing frequency and duration of afib.  + fatigue and decreased exercise tolerance during afib.  + tachypalpitations Today, he denies symptoms of chest pain, shortness of breath,  lower extremity edema, dizziness, presyncope, or syncope.  The patient is otherwise without complaint today.  The patient denies symptoms of fevers, chills, cough, or new SOB worrisome for COVID 19.  Past Medical History:  Diagnosis Date  . AAA (abdominal aortic aneurysm) ([place]) LAST ABDOMINAL US 10-20-17 3.3 CM   MONITORED BY DR [name]  . Anxiety   . Arthritis   . Atrial fibrillation ([place])   . Basal cell carcinoma   . Bilateral carotid artery stenosis DUPLEX 12-29-2012  BY DR [facility]   BILATERAL ICA STENOSIS 60-79%  . BPH (benign prostatic hypertrophy)   . Chronic diastolic heart failure ([place])   . Chronic kidney disease    stage 3, pt unaware  . Colon cancer ([place])   . Complication of anesthesia    had nausea after carotid artery surgery, states the medicine given to help the nausea, made it worse  . COPD (chronic  obstructive pulmonary disease) ([name])   . Depression   . GERD (gastroesophageal reflux disease)   . Glaucoma BOTH EYES   Dr Gershon Crane  . History of basal cell carcinoma excision   . History of lung cancer APRIL 2012  SQUAMOUS CELL---- S/P RIGHT LOWER LOBECTOMY AT [facility] --  NO CHEMORADIATION---  NO RECURRENCE    ONCOLOGIST- DR Tressie Stalker  LOV IN [facility] 10-27-2012  . History of pneumothorax    pt unaware  . Hx of adenomatous colonic polyps 2005    X 2; 1 hyperplastic polyp; Dr Olevia Perches  . Hyperlipidemia   . Hypertension   . Impaired fasting glucose 2007   108; A1c5.4%  . Lesion of bladder   . Lung cancer ([place]) 01/25/2012  . Microhematuria   . OSA (obstructive sleep apnea) 08/29/2015   CPAP SET ON 10  . PAD (peripheral artery disease) ([place]) ABI'S  JAN 2014  0.65 ON RIGHT ;  1.04 ON LEFT  . Peripheral vascular disease ([place]) S/P ANGIOPLASTY AND STENTING   FOLLOWED  BY DR [name]  . Pleural effusion on right 11/18/2018   Chronic, noted on CXR  . Spinal stenosis Sept. 2015  . Status post placement of implantable loop recorder   . Stroke [facility]) Jul 07, 2013   mini  TIA  . Thoracic aorta atherosclerosis ([place])   . Thyrotoxicosis    amiodarone induced  . Urgency of urination     Past Surgical History:  Procedure Laterality Date  . ANGIO PLASTY     X 4 in legs  . AORTOGRAM  07-27-2002   MILD DIFFUSE ILIAC ARTERY OCCLUSIVE DISEASE /  LEFT RENAL ARTERY 20%/ PATENT LEFT FEM-POP GRAFT/ MILD SFA AND POPLITEAL ARTERY OCCLUSIVE DISEASE W/ SEVERE KIDNEY OCCLUSIVE DISEASE  . BASAL CELL CARCINOMA EXCISION     MULTIPLE TIMES--  RIGHT FOREARM, CHEEKS, AND BACK  . BIOPSY  06/25/2018   Procedure: BIOPSY;  Surgeon: Rush Landmark Telford Nab., MD;  Location: [place];  Service: Gastroenterology;;  . CARDIOVASCULAR STRESS TEST  12-08-2012  DR MCLEAN   NORMAL LEXISCAN WITH NO EXERCISE NUCLEAR STUDY/ EF 66%/   NO ISCHEMIA/ NO SIGNIFICANT CHANGE FROM PRIOR STUDY  . CARDIOVERSION N/A 02/14/2018   Procedure:  CARDIOVERSION;  Surgeon: Larey Dresser, MD;  Location: [facility] ENDOSCOPY;  Service: Cardiovascular;  Laterality: N/A;  . CAROTID ANGIOGRAM N/A 07/10/2013   Procedure: CAROTID ANGIOGRAM;  Surgeon: Elam Dutch, MD;  Location: [facility] CATH LAB;  Service: Cardiovascular;  Laterality: N/A;  . CAROTID ENDARTERECTOMY Right 07-14-13   cea  . CATARACT EXTRACTION W/ INTRAOCULAR LENS  IMPLANT, BILATERAL    . colon polyectomy    . COLONOSCOPY WITH PROPOFOL N/A 06/25/2018   Procedure: COLONOSCOPY WITH PROPOFOL;  Surgeon: Rush Landmark Telford Nab., MD;  Location: [place];  Service: Gastroenterology;  Laterality: N/A;  . CYSTOSCOPY W/ RETROGRADES Bilateral 01/21/2013   Procedure: CYSTOSCOPY WITH RETROGRADE PYELOGRAM;  Surgeon: Molli Hazard, MD;  [facility];  Service: Urology;  Laterality: Bilateral;   CYSTO, BLADDER BIOPSY, BILATERAL RETROGRADE PYELOGRAM  RAD TECH FROM RADIOLOGY PER JOY  . CYSTOSCOPY WITH BIOPSY N/A 01/21/2013   Procedure: CYSTOSCOPY WITH BIOPSY;  Surgeon: Molli Hazard, MD;  Location: [facility];  Service: Urology;  Laterality: N/A;  . ENDARTERECTOMY Right 07/14/2013   Procedure: ENDARTERECTOMY CAROTID;  Surgeon: Angelia Mould, MD;  Location: [place];  Service: Vascular;  Laterality: Right;  . EP IMPLANTABLE DEVICE N/A 08/12/2015   Procedure: Loop Recorder Insertion;  Surgeon: Thompson Grayer, MD;  Location: [place] CV LAB;  Service: Cardiovascular;  Laterality: N/A;  . ESOPHAGOGASTRODUODENOSCOPY (EGD) WITH PROPOFOL N/A 06/25/2018   Procedure: ESOPHAGOGASTRODUODENOSCOPY (EGD) WITH PROPOFOL;  Surgeon: Rush Landmark Telford Nab., MD;  Location: [place];  Service: Gastroenterology;  Laterality: N/A;  . ESOPHAGOGASTRODUODENOSCOPY (EGD) WITH PROPOFOL N/A 07/10/2018   Procedure: ESOPHAGOGASTRODUODENOSCOPY (EGD) WITH PROPOFOL;  Surgeon: Milus Banister, MD;  Location: [facility] ENDOSCOPY;  Service: Endoscopy;  Laterality: N/A;  . EUS N/A  07/10/2018   Procedure: UPPER ENDOSCOPIC ULTRASOUND (EUS) RADIAL;  Surgeon: Milus Banister, MD;  Location: [facility] ENDOSCOPY;  Service: Endoscopy;  Laterality: N/A;  . EYE SURGERY Right   . FEMORAL-POPLITEAL BYPASS GRAFT Left [facility]   AND [place]  [place]  . FEMORAL-POPLITEAL BYPASS GRAFT Left 08/30/2015   Procedure: REVISION OF BYPASS GRAFT Left  FEMORAL-POPLITEAL ARTERY;  Surgeon: Angelia Mould, MD;  Location: [place];  Service: Vascular;  Laterality: Left;  . FINE NEEDLE ASPIRATION N/A 07/10/2018   Procedure: FINE NEEDLE ASPIRATION (FNA) LINEAR;  Surgeon: Milus Banister, MD;  Location: [facility] ENDOSCOPY;  Service: Endoscopy;  Laterality: N/A;  . FLEXIBLE SIGMOIDOSCOPY N/A 12/12/2018   Procedure: FLEXIBLE SIGMOIDOSCOPY;  Surgeon: Ileana Roup, MD;  Location: [facility] ORS;  Service: General;  Laterality: N/A;  . LARYNGOSCOPY  06-27-2004   BX VOCAL CORD  (LEUKOPLAKIA)  PER PT NO ISSUES SINCE  . LEFT HEART CATH AND CORONARY ANGIOGRAPHY N/A 08/20/2017  Procedure: LEFT HEART CATH AND CORONARY ANGIOGRAPHY;  Surgeon: Larey Dresser, MD;  Location: [place] CV LAB;  Service: Cardiovascular;  Laterality: N/A;  . LOWER EXTREMITY ANGIOGRAM Bilateral 08/29/2015   Procedure: Lower Extremity Angiogram;  Surgeon: Angelia Mould, MD;  Location: [place] CV LAB;  Service: Cardiovascular;  Laterality: Bilateral;  . LUNG LOBECTOMY  01/24/2011    RIGHT UPPER LOBE  (SQUAMOUS CELL CARCINOMA) Dr Dorthea Cove , [facility]. No chemotherapyor radiation  . PATCH ANGIOPLASTY Right 07/14/2013   Procedure: PATCH ANGIOPLASTY;  Surgeon: Angelia Mould, MD;  Location: [place];  Service: Vascular;  Laterality: Right;  . PATCH ANGIOPLASTY Left 08/30/2015   Procedure: VEIN PATCH ANGIOPLASTY OF PROXIMAL Left BYPASS GRAFT;  Surgeon: Angelia Mould, MD;  Location: [place];  Service: Vascular;  Laterality: Left;  . PERIPHERAL VASCULAR CATHETERIZATION N/A 08/29/2015   Procedure: Abdominal Aortogram;   Surgeon: Angelia Mould, MD;  Location: [place] CV LAB;  Service: Cardiovascular;  Laterality: N/A;  . POLYPECTOMY  06/25/2018   Procedure: POLYPECTOMY;  Surgeon: Rush Landmark Telford Nab., MD;  Location: [place];  Service: Gastroenterology;;  . Lia Foyer INJECTION  06/25/2018   Procedure: SUBMUCOSAL INJECTION;  Surgeon: Irving Copas., MD;  Location: [place];  Service: Gastroenterology;;  . TEE WITHOUT CARDIOVERSION N/A 02/14/2018   Procedure: TRANSESOPHAGEAL ECHOCARDIOGRAM (TEE);  Surgeon: Larey Dresser, MD;  Location: [facility] ENDOSCOPY;  Service: Cardiovascular;  Laterality: N/A;  . trabecular surgery     OS  . TRANSTHORACIC ECHOCARDIOGRAM  12-29-2012  DR [facility]   MILD LVH/  LVSF NORMAL/ EF 34-19%/  GRADE I DIASTOLIC DYSFUNCTION    Current Outpatient Medications  Medication Sig Dispense Refill  . acetaminophen (TYLENOL) 325 MG tablet Take 650 mg every 6 (six) hours as needed by mouth for moderate pain or headache.    . albuterol (PROVENTIL HFA;VENTOLIN HFA) 108 (90 Base) MCG/ACT inhaler Inhale 2 puffs into the lungs every 6 (six) hours as needed for wheezing or shortness of breath. 1 Inhaler 0  . apixaban (ELIQUIS) 5 MG TABS tablet Take 1 tablet (5 mg total) by mouth 2 (two) times daily. 60 tablet 6  . bimatoprost (LUMIGAN) 0.01 % SOLN Place 1 drop at bedtime into both eyes.     [name] docusate sodium (COLACE) 100 MG capsule Take 100 mg by mouth daily.     . dorzolamide-timolol (COSOPT) 22.3-6.8 MG/ML ophthalmic solution Place 2 drops into both eyes 2 (two) times daily.     . ferrous sulfate 325 (65 FE) MG tablet Take 325 mg by mouth at bedtime.     [name] FLUoxetine (PROZAC) 40 MG capsule Take 1 capsule (40 mg total) by mouth daily. 30 capsule 0  . furosemide (LASIX) 20 MG tablet Take 3 tabs (60 mg) for 3 days, then 2 tabs (40 mg) daily. (Patient taking differently: Take 40 mg by mouth daily. ) 75 tablet 3  . Magnesium 500 MG CAPS Take 500 mg by mouth daily.     . Melatonin  5 MG TABS Take 5 mg by mouth at bedtime.    . methimazole (TAPAZOLE) 5 MG tablet Take 1 tablet (5 mg total) by mouth daily. 45 tablet 3  . montelukast (SINGULAIR) 10 MG tablet TAKE ONE TABLET AT BEDTIME. 30 tablet 2  . omeprazole (PRILOSEC) 20 MG capsule Take 1 capsule (20 mg total) by mouth daily. 90 capsule 3  . Polyethyl Glycol-Propyl Glycol (SYSTANE) 0.4-0.3 % SOLN Place 1 drop into both eyes daily as needed (for dry  eyes).     . potassium chloride SA (K-DUR,KLOR-CON) 20 MEQ tablet Take 1 tablet (20 mEq total) by mouth daily. 30 tablet 11  . rosuvastatin (CRESTOR) 40 MG tablet TAKE 1 TABLET BY MOUTH DAILY. 90 tablet 0  . senna (SENOKOT) 8.6 MG tablet Take 1 tablet by mouth 2 (two) times daily.    . SYMBICORT 160-4.5 MCG/ACT inhaler USE 2 PUFFS TWICE DAILY. 1 Inhaler 0  . amLODipine (NORVASC) 5 MG tablet Take 1 tablet (5 mg total) by mouth daily. 30 tablet 11  . metoprolol succinate (TOPROL-XL) 25 MG 24 hr tablet Take 1 tablet (25 mg total) by mouth daily. Take with or immediately following a meal. 90 tablet 3   No current facility-administered medications for this visit.     Allergies:   Niacin-lovastatin er; Penicillins; Sulfonamide derivatives; and Atorvastatin   Social History:  The patient  reports that he quit smoking about 4 years ago. His smoking use included cigarettes. He has a 25.00 pack-year smoking history. He has never used smokeless tobacco. He reports current alcohol use. He reports that he does not use drugs.   Family History:  The patient's  family history includes Alcohol abuse in his father; Cancer (age of onset: 73) in his daughter; Heart attack in his father; Heart disease in his father, paternal aunt, and paternal uncle; Hypertension in his father and paternal aunt; Stroke in his father, mother, paternal aunt, and paternal uncle.   ROS:  Please see the history of present illness.   All other systems are personally reviewed and negative.    Exam:    Vital Signs:  BP  (!) 134/56   Pulse 61   Ht 6\' 1"  (1.854 m)   Wt 188 lb (85.3 kg)   BMI 24.80 kg/m   Well sounding  Labs/Other Tests and Data Reviewed:    Recent Labs: 07/14/2018: Pro B Natriuretic peptide (BNP) 279.0 11/18/2018: B Natriuretic Peptide 387.8 12/03/2018: ALT 26 12/16/2018: BUN 13; Creatinine, Ser 0.99; Magnesium 2.0; Potassium 4.0; Sodium 137 01/29/2019: Hemoglobin 13.0; Platelets 352; TSH 11.765   Wt Readings from Last 3 Encounters:  02/11/19 188 lb (85.3 kg)  01/29/19 189 lb 12.8 oz (86.1 kg)  12/19/18 183 lb 13.8 oz (83.4 kg)     Other studies personally reviewed: Additional studies/ records that were reviewed today include: AF clinic notes, CHF clinic notes,  My prior notes  Review of the above records today demonstrates: as above TEE 02/14/18- preserved EF  Last device remote is reviewed from [name] PDF dated 02/05/2019 which reveals normal device function, afib   ASSESSMENT & PLAN:    1.  Paroxysmal atrial fibrillation Increased symptoms of afib The patient has symptomatic, recurrent paroxysmal atrial fibrillation. he has failed medical therapy with amiodarone. Chads2vasc score is eliquis.  he is anticoagulated with 6 . Therapeutic strategies for afib including medicine and ablation were discussed in detail with the patient today. Risk, benefits, and alternatives to EP study and radiofrequency ablation for afib were also discussed in detail today. These risks include but are not limited to stroke, bleeding, vascular damage, tamponade, perforation, damage to the esophagus, lungs, and other structures, pulmonary vein stenosis, worsening renal function, and death. The patient understands these risk and wishes to proceed.  We will therefore proceed with catheter ablation at the next available time.  Carto, ICE, anesthesia are requested for the procedure.  Will also obtain TEE or CT prior to the procedure to exclude LAA thrombus and further evaluate atrial anatomy.  ILR is at RRT,   We will plan to replace his ILR at time of his ablation.  2. Hyperthyroidism Occurred with amiodarone Avoid amiodarone in the future   3. Chronic diastolic dysfunction Stable No change required today  4. PAD Stable No change required today  5. AAA Followed by [facility]  6. OSA Compliant with CPAP   7. COVID 19 screen The patient denies symptoms of COVID 19 at this time.  The importance of social distancing was discussed today.   Current medicines are reviewed at length with the patient today.   The patient does not have concerns regarding his medicines.  The following changes were made today:  none  Labs/ tests ordered today include:  No orders of the defined types were placed in this encounter.  Patient Risk:  after full review of this patients clinical status, I feel that they are at moderate risk at this time.  Today, I have spent 22 minutes with the patient with telehealth technology discussing afib .    Army Fossa, MD  02/11/2019 3:45 PM     [place] [address] [place] [address] [phone] (office) [phone] (fax)

## 2019-02-11 NOTE — Telephone Encounter (Signed)
Follow Up:; ° ° °Returning your call. °

## 2019-02-13 ENCOUNTER — Other Ambulatory Visit: Payer: Self-pay | Admitting: *Deleted

## 2019-02-13 ENCOUNTER — Telehealth: Payer: Self-pay | Admitting: Internal Medicine

## 2019-02-13 MED ORDER — OMEPRAZOLE 20 MG PO CPDR: 20.0000 mg | DELAYED_RELEASE_CAPSULE | Freq: Every day | ORAL | 0 refills | Status: DC

## 2019-02-13 MED ORDER — MONTELUKAST SODIUM 10 MG PO TABS: 10.0000 mg | ORAL_TABLET | Freq: Every day | ORAL | 2 refills | Status: DC

## 2019-02-13 MED ORDER — FLUOXETINE HCL 40 MG PO CAPS: 40.0000 mg | ORAL_CAPSULE | Freq: Every day | ORAL | 0 refills | Status: DC

## 2019-02-13 NOTE — Telephone Encounter (Signed)
Refill of montelukast has been sent to pill pack for pt. Nothing further needed.

## 2019-02-26 ENCOUNTER — Other Ambulatory Visit: Payer: Self-pay | Admitting: Internal Medicine

## 2019-02-27 ENCOUNTER — Other Ambulatory Visit: Payer: Self-pay | Admitting: Internal Medicine

## 2019-02-27 DIAGNOSIS — E0580 Other thyrotoxicosis without thyrotoxic crisis or storm: Secondary | ICD-10-CM

## 2019-03-02 ENCOUNTER — Other Ambulatory Visit (INDEPENDENT_AMBULATORY_CARE_PROVIDER_SITE_OTHER): Payer: Commercial Managed Care - PPO

## 2019-03-02 ENCOUNTER — Other Ambulatory Visit: Payer: Self-pay

## 2019-03-02 DIAGNOSIS — E0580 Other thyrotoxicosis without thyrotoxic crisis or storm: Secondary | ICD-10-CM | POA: Diagnosis not present

## 2019-03-02 LAB — TSH: TSH: 5.73 u[IU]/mL — ABNORMAL HIGH (ref 0.35–4.50)

## 2019-03-02 LAB — T4, FREE: Free T4: 0.73 ng/dL (ref 0.60–1.60)

## 2019-03-02 LAB — T3, FREE: T3, Free: 2.6 pg/mL (ref 2.3–4.2)

## 2019-03-03 ENCOUNTER — Telehealth: Payer: Self-pay | Admitting: Internal Medicine

## 2019-03-03 ENCOUNTER — Other Ambulatory Visit: Payer: Self-pay | Admitting: Internal Medicine

## 2019-03-03 ENCOUNTER — Ambulatory Visit (HOSPITAL_COMMUNITY)
Admission: RE | Admit: 2019-03-03 | Discharge: 2019-03-03 | Disposition: A | Discharge status: Home / Self Care | Payer: Commercial Managed Care - PPO | Source: Ambulatory Visit | Attending: Oncology | Admitting: Oncology

## 2019-03-03 ENCOUNTER — Encounter (HOSPITAL_COMMUNITY): Payer: Self-pay

## 2019-03-03 ENCOUNTER — Other Ambulatory Visit: Payer: Self-pay

## 2019-03-03 ENCOUNTER — Inpatient Hospital Stay: Payer: Commercial Managed Care - PPO | Attending: Oncology

## 2019-03-03 DIAGNOSIS — Z862 Personal history of diseases of the blood and blood-forming organs and certain disorders involving the immune mechanism: Secondary | ICD-10-CM | POA: Insufficient documentation

## 2019-03-03 DIAGNOSIS — C187 Malignant neoplasm of sigmoid colon: Secondary | ICD-10-CM | POA: Insufficient documentation

## 2019-03-03 DIAGNOSIS — J449 Chronic obstructive pulmonary disease, unspecified: Secondary | ICD-10-CM | POA: Diagnosis not present

## 2019-03-03 DIAGNOSIS — E0590 Thyrotoxicosis, unspecified without thyrotoxic crisis or storm: Secondary | ICD-10-CM | POA: Diagnosis not present

## 2019-03-03 DIAGNOSIS — I4891 Unspecified atrial fibrillation: Secondary | ICD-10-CM | POA: Diagnosis not present

## 2019-03-03 DIAGNOSIS — E0580 Other thyrotoxicosis without thyrotoxic crisis or storm: Secondary | ICD-10-CM

## 2019-03-03 DIAGNOSIS — Z85118 Personal history of other malignant neoplasm of bronchus and lung: Secondary | ICD-10-CM | POA: Insufficient documentation

## 2019-03-03 LAB — BASIC METABOLIC PANEL - CANCER CENTER ONLY
Anion gap: 10 (ref 5–15)
BUN: 18 mg/dL (ref 8–23)
CO2: 25 mmol/L (ref 22–32)
Calcium: 9.6 mg/dL (ref 8.9–10.3)
Chloride: 107 mmol/L (ref 98–111)
Creatinine: 1.11 mg/dL (ref 0.61–1.24)
GFR, Est AFR Am: >60 mL/min (ref >60)
GFR, Estimated: >60 mL/min (ref >60)
Glucose, Bld: 105 mg/dL — ABNORMAL HIGH (ref 70–99)
Potassium: 4.5 mmol/L (ref 3.5–5.1)
Sodium: 142 mmol/L (ref 135–145)

## 2019-03-03 LAB — CEA (IN HOUSE-CHCC): CEA (CHCC-In House): 1.3 ng/mL (ref 0.00–5.00)

## 2019-03-03 MED ORDER — METHIMAZOLE 5 MG PO TABS: 2.5000 mg | ORAL_TABLET | Freq: Every day | ORAL | 3 refills | Status: DC

## 2019-03-03 MED ORDER — IOHEXOL 300 MG/ML  SOLN
75.0000 mL | Freq: Once | INTRAMUSCULAR | Status: AC | PRN
  Administered 2019-03-03: 75 mL via INTRAVENOUS

## 2019-03-03 MED ORDER — SODIUM CHLORIDE (PF) 0.9 % IJ SOLN
INTRAMUSCULAR | Status: AC
  Filled 2019-03-03: qty 50

## 2019-03-03 NOTE — Telephone Encounter (Signed)
Patients wife called to get patient's recent lab results. Please call back with

## 2019-03-05 ENCOUNTER — Inpatient Hospital Stay (HOSPITAL_BASED_OUTPATIENT_CLINIC_OR_DEPARTMENT_OTHER): Payer: Commercial Managed Care - PPO | Admitting: Oncology

## 2019-03-05 ENCOUNTER — Other Ambulatory Visit: Payer: Self-pay

## 2019-03-05 ENCOUNTER — Telehealth: Payer: Self-pay | Admitting: Internal Medicine

## 2019-03-05 VITALS — BP 105/47 | HR 81 | Temp 97.3°F | Resp 18 | Ht 73.0 in | Wt 194.3 lb

## 2019-03-05 DIAGNOSIS — J449 Chronic obstructive pulmonary disease, unspecified: Secondary | ICD-10-CM

## 2019-03-05 DIAGNOSIS — C187 Malignant neoplasm of sigmoid colon: Secondary | ICD-10-CM | POA: Diagnosis not present

## 2019-03-05 DIAGNOSIS — I4891 Unspecified atrial fibrillation: Secondary | ICD-10-CM

## 2019-03-05 DIAGNOSIS — Z862 Personal history of diseases of the blood and blood-forming organs and certain disorders involving the immune mechanism: Secondary | ICD-10-CM

## 2019-03-05 DIAGNOSIS — Z85118 Personal history of other malignant neoplasm of bronchus and lung: Secondary | ICD-10-CM

## 2019-03-05 DIAGNOSIS — E0590 Thyrotoxicosis, unspecified without thyrotoxic crisis or storm: Secondary | ICD-10-CM

## 2019-03-05 MED ORDER — FUROSEMIDE 20 MG PO TABS: 40.0000 mg | ORAL_TABLET | Freq: Every day | ORAL | Status: DC

## 2019-03-05 NOTE — Progress Notes (Signed)
[facility] OFFICE PROGRESS NOTE   Diagnosis: Colon cancer, history of lung cancer  INTERVAL HISTORY:   Troy [name] returns as scheduled.  He reports feeling well.  He continues treatment for hyperthyroidism.  He reports that he will be scheduled for a "ablation" procedure for management of atrial fibrillation.  He has occasional tremors of both hands.   Objective:  Vital signs in last 24 hours:  Blood pressure (!) 105/47, pulse 81, temperature (!) 97.3 F (36.3 C), temperature source Oral, resp. rate 18, height _0  (1.854 m), weight 194 lb 4.8 oz (88.1 kg), SpO2 97 %.    Limited examination secondary to distancing with the COVID pandemic  Cardio: Regular rate and rhythm  Neuro: No tremor of the hands     Lab Results:  Lab Results  Component Value Date   WBC 6.4 01/29/2019   HGB 13.0 01/29/2019   HCT 40.4 01/29/2019   MCV 95.3 01/29/2019   PLT 352 01/29/2019   NEUTROABS 5.8 12/03/2018    CMP  Lab Results  Component Value Date   NA 142 03/03/2019   K 4.5 03/03/2019   CL 107 03/03/2019   CO2 25 03/03/2019   GLUCOSE 105 (H) 03/03/2019   BUN 18 03/03/2019   CREATININE 1.11 03/03/2019   CALCIUM 9.6 03/03/2019   PROT 7.0 12/03/2018   ALBUMIN 3.2 (L) 12/03/2018   AST 25 12/03/2018   ALT 26 12/03/2018   ALKPHOS 117 12/03/2018   BILITOT 0.6 12/03/2018   GFRNONAA >60 03/03/2019   GFRAA >60 03/03/2019    Lab Results  Component Value Date   CEA1 1.30 03/03/2019     Imaging:  Ct Chest W Contrast  Result Date: 03/03/2019 CLINICAL DATA:  History of colon and lung cancer. Follow-up mediastinal lymphadenopathy. History of right lower lobectomy. Shortness of breath. EXAM: CT CHEST WITH CONTRAST TECHNIQUE: Multidetector CT imaging of the chest was performed during intravenous contrast administration. CONTRAST:  47m OMNIPAQUE IOHEXOL 300 MG/ML  SOLN COMPARISON:  CT chest 06/25/2018 and 08/13/2017. FINDINGS: Cardiovascular: Moderate diffuse  atherosclerosis of the aorta, great vessels and coronary arteries. No acute vascular findings. The heart size is normal. There is no pericardial effusion. Mediastinum/Nodes: The previously noted mildly enlarged right paratracheal node is stable, measuring 13 mm short axis on image 29/2. Additional small mediastinal and hilar lymph nodes are stable. No progressive adenopathy. The thyroid gland, trachea and esophagus demonstrate no significant findings. Lungs/Pleura: Stable postsurgical changes from previous right lower lobectomy. The loculated right pleural effusion and associated pleural thickening are stable and consistent with a fibrothorax. Emphysema, scarring in the right middle lobe and scattered small pulmonary nodules are again noted, not significantly changed. No new or enlarging nodules. Upper abdomen: Small vague enhancing lesion posteriorly in the right hepatic lobe on image 148/2 is stable. No adrenal mass. There is stable cholelithiasis and mild extrahepatic biliary dilatation. Musculoskeletal/Chest wall: There is no chest wall mass or suspicious osseous finding. IMPRESSION: 1. Stable postoperative chest status post right lower lobectomy. Mildly prominent right paratracheal node is unchanged, likely reactive. Stable right fibrothorax. 2. No evidence of metastatic disease. 3. Stable appearance of the visualized upper abdomen. 4. Aortic Atherosclerosis (ICD10-I70.0) and Emphysema (ICD10-J43.9). Electronically Signed   By: WRichardean SaleM.D.   On: 03/03/2019 16:15    Medications: I have reviewed the patient's current medications.   Assessment/Plan: 1. Adenocarcinoma of sigmoid colon-stage II (T3N0)   Colonoscopy 06/25/2018- multiple polyps in the ascending and descending colon, partially obstructing mass  in the sigmoid colon-biopsy confirmed adenocarcinoma  CTs 06/25/2018-enlarging right paratracheal lymph node compared to November 2018, no other evidence of metastatic disease  Low anterior  resection 12/12/2018, stage II (T3N0) adenocarcinoma the sigmoid colon, no loss of mismatch repair protein expression, MSI-stable    2.  History of iron deficiency anemia Secondary to bleeding from tumor, exacerbated by anticoagulation with eliquis 3.Stage Ib squamous cell carcinoma of right lower lung s/pbronchoscopy, mediastinoscopy, right thoracoscopic lowerlobectomy 01/24/2011 -Path poorly differentiated squamous cell carcinoma with positive vascular invasion LN stations 4R, 4L, 7, 8, 9, 10, 11, and 12 w/o evidence of malignancy - pT2a,pN0,pMX stage IB  -managed and monitored per Dr. Elenor Quinones at [facility]. Last Ct chest 05/01/18 stable, without concerning evidence of recurrence -CT 06/25/2018- nonspecific small bilateral nodules, enlarging superior right paratracheal node -EUS biopsy of the right paratracheal nodes (several rounded nodes measuring 4 cm in total) on 07/10/2018- negative for malignancy -CT chest 03/03/2019- stable postoperative appearance of the chest, unchanged mildly prominent right paratracheal lymph node, no evidence of metastatic disease  4. COPD 5. Atrial fibrillation 6. Thyrotoxicosis-potentially amiodarone related  Disposition: Troy [name] appears well.  He is in clinical remission from colon cancer and lung cancer.  He will return for an office visit and CEA in 6 months.  Betsy Coder, MD  03/05/2019  5:10 PM

## 2019-03-05 NOTE — Telephone Encounter (Signed)
Please tell him to follow the instructions.   I advised the patient: "You can either cut the tablet in half and take half a tablet daily or take 1 whole tablet every other day."

## 2019-03-05 NOTE — Telephone Encounter (Signed)
[facility] is stating that the new directions for methimazole (TAPAZOLE) 5 MG tablet, is a concern due to this being a hazardous drug. Please Advise with pharmacy and send new RX.  Thanks

## 2019-03-05 NOTE — Telephone Encounter (Signed)
PER MyChart   Viewed by Olin Hauser [name] on 03/04/2019 4:35 PM  Written by Philemon Kingdom, MD on 03/03/2019 1:21 PM  Dear [name],  Kermit Balo news: Your thyroid test are better but the TSH is still slightly high, so at this point we can decrease the methimazole even further to 2.5 mg daily. You can either cut the tablet in half and take half a tablet daily or take 1 whole tablet every other day. We will then need to repeat your thyroid tests in 5 to 6 weeks after this change.  Please call our main office number [phone]) to schedule a lab appointment.  Sincerely,  Philemon Kingdom MD   I spoke with patient and confirmed he did receive the results, no further questions.

## 2019-03-06 ENCOUNTER — Telehealth: Payer: Self-pay | Admitting: Oncology

## 2019-03-06 MED ORDER — METHIMAZOLE 5 MG PO TABS: 2.5000 mg | ORAL_TABLET | Freq: Every day | ORAL | 3 refills | Status: DC

## 2019-03-06 NOTE — Telephone Encounter (Signed)
I do not think the pharmacy had the current RX because it was marked No Print.  I have changed it to normal and sent it in.

## 2019-03-06 NOTE — Telephone Encounter (Signed)
Scheduled appt per 6/4 los. ° °A calendar will be mailed out. °

## 2019-03-10 ENCOUNTER — Ambulatory Visit (INDEPENDENT_AMBULATORY_CARE_PROVIDER_SITE_OTHER): Payer: Commercial Managed Care - PPO | Admitting: *Deleted

## 2019-03-10 DIAGNOSIS — I480 Paroxysmal atrial fibrillation: Secondary | ICD-10-CM

## 2019-03-10 LAB — CUP PACEART REMOTE DEVICE CHECK
Date Time Interrogation Session: 20200609133214
Implantable Pulse Generator Implant Date: 20161111

## 2019-03-16 ENCOUNTER — Telehealth: Payer: Self-pay

## 2019-03-16 DIAGNOSIS — I480 Paroxysmal atrial fibrillation: Secondary | ICD-10-CM

## 2019-03-16 NOTE — Telephone Encounter (Signed)
Outreach made to Pt.  Spoke with wife.  Wife to speak with her husband and they will call me back tomorrow to discuss afib ablation

## 2019-03-19 NOTE — Progress Notes (Signed)
Carelink Summary Report / Loop Recorder 

## 2019-03-20 ENCOUNTER — Other Ambulatory Visit: Payer: Self-pay | Admitting: Internal Medicine

## 2019-03-20 NOTE — Telephone Encounter (Signed)
Pt last seen in 07/20/18.

## 2019-03-26 NOTE — Telephone Encounter (Signed)
Follow up   Patient's wife has additional questions about the ablation procedure. Please call.

## 2019-03-27 NOTE — Telephone Encounter (Signed)
Returned call to wife.  Questions answered.  Completed instruction letter and scheduling.  No further action needed at this time.

## 2019-03-30 ENCOUNTER — Telehealth: Payer: Self-pay | Admitting: Internal Medicine

## 2019-03-30 NOTE — Telephone Encounter (Signed)
Patient is going to dentist tomorrow for a tooth abscess, his is currently on antibiotics. Wife was just calling us to let us know, as she didn't know if that would affect his upcoming surgery.

## 2019-03-31 NOTE — Telephone Encounter (Signed)
Follow Up:    Wife returning your call from today.

## 2019-03-31 NOTE — Telephone Encounter (Signed)
Follow up:     Patient calling back from yesterday and would ike for someone to call her back today.

## 2019-03-31 NOTE — Telephone Encounter (Signed)
Spoke with patient's wife who states patient had an abscessed tooth pulled this morning. He is on antibiotics and stitches are scheduled to be removed on July 8. Patient has  ablation scheduled for July 9. Patient is also scheduled for a root canal July 16. I advised that patient's extraction and removal of stitches would not interfere with ablation as long as the patient has no signs of infection. I advised I will route question regarding whether patient can have root canal on July 16 to [name] because I am not certain whether there will be any interference with ablation. Wife verbalized understanding and agreement and thanked me for the call.

## 2019-03-31 NOTE — Telephone Encounter (Signed)
Left message for patient to call back and ask for [place], [place]

## 2019-03-31 NOTE — Telephone Encounter (Signed)
If he has to hold anticoagulation for the procedure, we may need to delay his ablation.

## 2019-04-01 ENCOUNTER — Encounter (HOSPITAL_COMMUNITY): Payer: Commercial Managed Care - PPO | Admitting: Cardiology

## 2019-04-01 NOTE — Telephone Encounter (Signed)
LMTCB

## 2019-04-01 NOTE — Telephone Encounter (Signed)
Spoke with Pt's wife multiple times today.  Pt does not need to hold blood thinner for root canal.  Problem then became dental visit after covid test and prior to ablation.  Discussed with dentist.  Pt stitch removal will be rescheduled to 03/08/19 in the am prior to labs/covid test.  Mailed Pt instruction letter per wife request.  No further action needed.

## 2019-04-06 ENCOUNTER — Other Ambulatory Visit: Payer: Self-pay

## 2019-04-06 ENCOUNTER — Other Ambulatory Visit (HOSPITAL_COMMUNITY)
Admission: RE | Admit: 2019-04-06 | Discharge: 2019-04-06 | Disposition: A | Discharge status: Home / Self Care | Payer: Commercial Managed Care - PPO | Source: Ambulatory Visit | Attending: Internal Medicine | Admitting: Internal Medicine

## 2019-04-06 ENCOUNTER — Other Ambulatory Visit: Payer: Commercial Managed Care - PPO | Admitting: *Deleted

## 2019-04-06 DIAGNOSIS — Z1159 Encounter for screening for other viral diseases: Secondary | ICD-10-CM | POA: Diagnosis not present

## 2019-04-06 DIAGNOSIS — Z01812 Encounter for preprocedural laboratory examination: Secondary | ICD-10-CM | POA: Diagnosis present

## 2019-04-06 DIAGNOSIS — I480 Paroxysmal atrial fibrillation: Secondary | ICD-10-CM

## 2019-04-06 NOTE — Telephone Encounter (Signed)
Returned call to wife.  Gave verbal instruction for ablation d/t letter still hasn't gotten to Pt/family.

## 2019-04-06 NOTE — Telephone Encounter (Signed)
Follow up   Patient's wife states that she has not received instruction letter and has questions about the instructions. Please call.

## 2019-04-07 LAB — CBC WITH DIFFERENTIAL/PLATELET
Basophils Absolute: 0 10*3/uL (ref 0.0–0.2)
Basos: 0 %
EOS (ABSOLUTE): 0.1 10*3/uL (ref 0.0–0.4)
Eos: 1 %
Hematocrit: 39.4 % (ref 37.5–51.0)
Hemoglobin: 12.9 g/dL — ABNORMAL LOW (ref 13.0–17.7)
Immature Grans (Abs): 0 10*3/uL (ref 0.0–0.1)
Immature Granulocytes: 0 %
Lymphocytes Absolute: 1.1 10*3/uL (ref 0.7–3.1)
Lymphs: 15 %
MCH: 31.1 pg (ref 26.6–33.0)
MCHC: 32.7 g/dL (ref 31.5–35.7)
MCV: 95 fL (ref 79–97)
Monocytes Absolute: 0.5 10*3/uL (ref 0.1–0.9)
Monocytes: 7 %
Neutrophils Absolute: 5.8 10*3/uL (ref 1.4–7.0)
Neutrophils: 77 %
Platelets: 357 10*3/uL (ref 150–450)
RBC: 4.15 x10E6/uL (ref 4.14–5.80)
RDW: 13.7 % (ref 11.6–15.4)
WBC: 7.6 10*3/uL (ref 3.4–10.8)

## 2019-04-07 LAB — BASIC METABOLIC PANEL
BUN/Creatinine Ratio: 15 (ref 10–24)
BUN: 19 mg/dL (ref 8–27)
CO2: 21 mmol/L (ref 20–29)
Calcium: 9.6 mg/dL (ref 8.6–10.2)
Chloride: 103 mmol/L (ref 96–106)
Creatinine, Ser: 1.26 mg/dL (ref 0.76–1.27)
GFR calc Af Amer: 63 mL/min/{1.73_m2} (ref >59)
GFR calc non Af Amer: 54 mL/min/{1.73_m2} — ABNORMAL LOW (ref >59)
Glucose: 149 mg/dL — ABNORMAL HIGH (ref 65–99)
Potassium: 4.4 mmol/L (ref 3.5–5.2)
Sodium: 140 mmol/L (ref 134–144)

## 2019-04-07 LAB — SARS CORONAVIRUS 2 (TAT 6-24 HRS): SARS Coronavirus 2: NEGATIVE

## 2019-04-08 ENCOUNTER — Telehealth: Payer: Self-pay

## 2019-04-08 NOTE — Telephone Encounter (Signed)
Follow up  Patient's wife would like a call to discuss ablation. Please call.

## 2019-04-08 NOTE — Telephone Encounter (Signed)
Returned call to Pt's wife.  Discussed ablation instructions again.  Pt wife indicates understanding.

## 2019-04-09 ENCOUNTER — Other Ambulatory Visit: Payer: Self-pay | Admitting: Internal Medicine

## 2019-04-09 ENCOUNTER — Ambulatory Visit (HOSPITAL_COMMUNITY): Payer: Commercial Managed Care - PPO | Admitting: Anesthesiology

## 2019-04-09 ENCOUNTER — Ambulatory Visit (HOSPITAL_COMMUNITY)
Admission: RE | Admit: 2019-04-09 | Discharge: 2019-04-09 | Disposition: A | Discharge status: Home / Self Care | Payer: Commercial Managed Care - PPO | Attending: Internal Medicine | Admitting: Internal Medicine

## 2019-04-09 ENCOUNTER — Ambulatory Visit (HOSPITAL_COMMUNITY)
Admission: RE | Admit: 2019-04-09 | Payer: Commercial Managed Care - PPO | Source: Home / Self Care | Admitting: Internal Medicine

## 2019-04-09 ENCOUNTER — Other Ambulatory Visit (HOSPITAL_COMMUNITY): Payer: Self-pay | Admitting: Cardiology

## 2019-04-09 ENCOUNTER — Encounter (HOSPITAL_COMMUNITY): Payer: Self-pay | Admitting: Anesthesiology

## 2019-04-09 ENCOUNTER — Encounter (HOSPITAL_COMMUNITY)
Admission: RE | Disposition: A | Discharge status: Home / Self Care | Payer: Commercial Managed Care - PPO | Source: Home / Self Care | Attending: Internal Medicine

## 2019-04-09 ENCOUNTER — Other Ambulatory Visit: Payer: Self-pay

## 2019-04-09 DIAGNOSIS — Z7901 Long term (current) use of anticoagulants: Secondary | ICD-10-CM | POA: Diagnosis not present

## 2019-04-09 DIAGNOSIS — Z85038 Personal history of other malignant neoplasm of large intestine: Secondary | ICD-10-CM | POA: Diagnosis not present

## 2019-04-09 DIAGNOSIS — E785 Hyperlipidemia, unspecified: Secondary | ICD-10-CM | POA: Diagnosis not present

## 2019-04-09 DIAGNOSIS — I5032 Chronic diastolic (congestive) heart failure: Secondary | ICD-10-CM | POA: Diagnosis not present

## 2019-04-09 DIAGNOSIS — I714 Abdominal aortic aneurysm, without rupture: Secondary | ICD-10-CM | POA: Insufficient documentation

## 2019-04-09 DIAGNOSIS — Z87891 Personal history of nicotine dependence: Secondary | ICD-10-CM | POA: Diagnosis not present

## 2019-04-09 DIAGNOSIS — I739 Peripheral vascular disease, unspecified: Secondary | ICD-10-CM | POA: Insufficient documentation

## 2019-04-09 DIAGNOSIS — N400 Enlarged prostate without lower urinary tract symptoms: Secondary | ICD-10-CM | POA: Diagnosis not present

## 2019-04-09 DIAGNOSIS — Z85118 Personal history of other malignant neoplasm of bronchus and lung: Secondary | ICD-10-CM | POA: Insufficient documentation

## 2019-04-09 DIAGNOSIS — Z880 Allergy status to penicillin: Secondary | ICD-10-CM | POA: Insufficient documentation

## 2019-04-09 DIAGNOSIS — Z8249 Family history of ischemic heart disease and other diseases of the circulatory system: Secondary | ICD-10-CM | POA: Insufficient documentation

## 2019-04-09 DIAGNOSIS — I480 Paroxysmal atrial fibrillation: Secondary | ICD-10-CM | POA: Diagnosis present

## 2019-04-09 DIAGNOSIS — F419 Anxiety disorder, unspecified: Secondary | ICD-10-CM | POA: Insufficient documentation

## 2019-04-09 DIAGNOSIS — Z882 Allergy status to sulfonamides status: Secondary | ICD-10-CM | POA: Diagnosis not present

## 2019-04-09 DIAGNOSIS — Z7951 Long term (current) use of inhaled steroids: Secondary | ICD-10-CM | POA: Diagnosis not present

## 2019-04-09 DIAGNOSIS — I483 Typical atrial flutter: Secondary | ICD-10-CM | POA: Insufficient documentation

## 2019-04-09 DIAGNOSIS — N183 Chronic kidney disease, stage 3 (moderate): Secondary | ICD-10-CM | POA: Diagnosis not present

## 2019-04-09 DIAGNOSIS — K219 Gastro-esophageal reflux disease without esophagitis: Secondary | ICD-10-CM | POA: Diagnosis not present

## 2019-04-09 DIAGNOSIS — Z8673 Personal history of transient ischemic attack (TIA), and cerebral infarction without residual deficits: Secondary | ICD-10-CM | POA: Diagnosis not present

## 2019-04-09 DIAGNOSIS — I130 Hypertensive heart and chronic kidney disease with heart failure and stage 1 through stage 4 chronic kidney disease, or unspecified chronic kidney disease: Secondary | ICD-10-CM | POA: Diagnosis not present

## 2019-04-09 DIAGNOSIS — G4733 Obstructive sleep apnea (adult) (pediatric): Secondary | ICD-10-CM | POA: Diagnosis not present

## 2019-04-09 DIAGNOSIS — Z79899 Other long term (current) drug therapy: Secondary | ICD-10-CM | POA: Insufficient documentation

## 2019-04-09 DIAGNOSIS — M1990 Unspecified osteoarthritis, unspecified site: Secondary | ICD-10-CM | POA: Insufficient documentation

## 2019-04-09 DIAGNOSIS — Z85828 Personal history of other malignant neoplasm of skin: Secondary | ICD-10-CM | POA: Insufficient documentation

## 2019-04-09 DIAGNOSIS — F329 Major depressive disorder, single episode, unspecified: Secondary | ICD-10-CM | POA: Insufficient documentation

## 2019-04-09 DIAGNOSIS — J449 Chronic obstructive pulmonary disease, unspecified: Secondary | ICD-10-CM | POA: Insufficient documentation

## 2019-04-09 HISTORY — PX: ATRIAL FIBRILLATION ABLATION: EP1191

## 2019-04-09 HISTORY — PX: LOOP RECORDER REMOVAL: EP1215

## 2019-04-09 HISTORY — PX: LOOP RECORDER INSERTION: EP1214

## 2019-04-09 LAB — POCT ACTIVATED CLOTTING TIME
Activated Clotting Time: 263 seconds
Activated Clotting Time: 323 seconds

## 2019-04-09 SURGERY — ATRIAL FIBRILLATION ABLATION
Anesthesia: General

## 2019-04-09 MED ORDER — HEPARIN SODIUM (PORCINE) 1000 UNIT/ML IJ SOLN
INTRAMUSCULAR | Status: DC | PRN
  Administered 2019-04-09: 5000 [IU] via INTRAVENOUS

## 2019-04-09 MED ORDER — PROTAMINE SULFATE 10 MG/ML IV SOLN
INTRAVENOUS | Status: DC | PRN
  Administered 2019-04-09 (×4): 10 mg via INTRAVENOUS

## 2019-04-09 MED ORDER — SODIUM CHLORIDE 0.9% FLUSH: 3.0000 mL | INTRAVENOUS | Status: DC | PRN

## 2019-04-09 MED ORDER — METHIMAZOLE 5 MG PO TABS: 2.5000 mg | ORAL_TABLET | Freq: Every day | ORAL | Status: DC

## 2019-04-09 MED ORDER — DORZOLAMIDE HCL-TIMOLOL MAL 2-0.5 % OP SOLN
1.0000 [drp] | Freq: Two times a day (BID) | OPHTHALMIC | Status: DC
  Filled 2019-04-09: qty 10

## 2019-04-09 MED ORDER — ACETAMINOPHEN 325 MG PO TABS: 650.0000 mg | ORAL_TABLET | ORAL | Status: DC | PRN

## 2019-04-09 MED ORDER — ONDANSETRON HCL 4 MG/2ML IJ SOLN: 4.0000 mg | Freq: Four times a day (QID) | INTRAMUSCULAR | Status: DC | PRN

## 2019-04-09 MED ORDER — FENTANYL CITRATE (PF) 100 MCG/2ML IJ SOLN
INTRAMUSCULAR | Status: DC | PRN
  Administered 2019-04-09: 75 ug via INTRAVENOUS
  Administered 2019-04-09: 25 ug via INTRAVENOUS

## 2019-04-09 MED ORDER — ROCURONIUM BROMIDE 10 MG/ML (PF) SYRINGE
PREFILLED_SYRINGE | INTRAVENOUS | Status: DC | PRN
  Administered 2019-04-09: 10 mg via INTRAVENOUS
  Administered 2019-04-09: 20 mg via INTRAVENOUS
  Administered 2019-04-09: 30 mg via INTRAVENOUS

## 2019-04-09 MED ORDER — MONTELUKAST SODIUM 10 MG PO TABS: 10.0000 mg | ORAL_TABLET | Freq: Every day | ORAL | Status: DC

## 2019-04-09 MED ORDER — DEXAMETHASONE SODIUM PHOSPHATE 10 MG/ML IJ SOLN
INTRAMUSCULAR | Status: DC | PRN
  Administered 2019-04-09: 10 mg via INTRAVENOUS

## 2019-04-09 MED ORDER — POTASSIUM CHLORIDE CRYS ER 20 MEQ PO TBCR: 20.0000 meq | EXTENDED_RELEASE_TABLET | Freq: Every day | ORAL | Status: DC

## 2019-04-09 MED ORDER — FUROSEMIDE 40 MG PO TABS: 40.0000 mg | ORAL_TABLET | Freq: Every day | ORAL | Status: DC

## 2019-04-09 MED ORDER — BUPIVACAINE HCL (PF) 0.25 % IJ SOLN
INTRAMUSCULAR | Status: DC | PRN
  Administered 2019-04-09: 30 mL
  Administered 2019-04-09: 20 mL

## 2019-04-09 MED ORDER — SUGAMMADEX SODIUM 200 MG/2ML IV SOLN
INTRAVENOUS | Status: DC | PRN
  Administered 2019-04-09: 200 mg via INTRAVENOUS

## 2019-04-09 MED ORDER — HEPARIN SODIUM (PORCINE) 1000 UNIT/ML IJ SOLN
INTRAMUSCULAR | Status: AC
  Filled 2019-04-09: qty 1

## 2019-04-09 MED ORDER — APIXABAN 5 MG PO TABS
5.0000 mg | ORAL_TABLET | Freq: Two times a day (BID) | ORAL | Status: DC
  Administered 2019-04-09: 16:00:00 5 mg via ORAL
  Filled 2019-04-09: qty 1

## 2019-04-09 MED ORDER — BUPIVACAINE HCL (PF) 0.25 % IJ SOLN
INTRAMUSCULAR | Status: AC
  Filled 2019-04-09: qty 30

## 2019-04-09 MED ORDER — LIDOCAINE 2% (20 MG/ML) 5 ML SYRINGE
INTRAMUSCULAR | Status: DC | PRN
  Administered 2019-04-09: 80 mg via INTRAVENOUS

## 2019-04-09 MED ORDER — SODIUM CHLORIDE 0.9 % IV SOLN
INTRAVENOUS | Status: DC
  Administered 2019-04-09: 09:00:00 via INTRAVENOUS

## 2019-04-09 MED ORDER — HEPARIN (PORCINE) IN NACL 2000-0.9 UNIT/L-% IV SOLN
INTRAVENOUS | Status: DC | PRN
  Administered 2019-04-09: 12000 mL

## 2019-04-09 MED ORDER — SUCCINYLCHOLINE CHLORIDE 20 MG/ML IJ SOLN
INTRAMUSCULAR | Status: DC | PRN
  Administered 2019-04-09: 100 mg via INTRAVENOUS

## 2019-04-09 MED ORDER — ONDANSETRON HCL 4 MG/2ML IJ SOLN
INTRAMUSCULAR | Status: DC | PRN
  Administered 2019-04-09: 4 mg via INTRAVENOUS

## 2019-04-09 MED ORDER — HEPARIN SODIUM (PORCINE) 1000 UNIT/ML IJ SOLN
INTRAMUSCULAR | Status: DC | PRN
  Administered 2019-04-09: 1000 [IU] via INTRAVENOUS

## 2019-04-09 MED ORDER — TRAMADOL HCL 50 MG PO TABS: 50.0000 mg | ORAL_TABLET | Freq: Four times a day (QID) | ORAL | Status: DC | PRN

## 2019-04-09 MED ORDER — ACETAMINOPHEN 500 MG PO TABS
1000.0000 mg | ORAL_TABLET | Freq: Once | ORAL | Status: AC
  Administered 2019-04-09: 1000 mg via ORAL
  Filled 2019-04-09 (×2): qty 2

## 2019-04-09 MED ORDER — MELATONIN 3 MG PO TABS
6.0000 mg | ORAL_TABLET | Freq: Every day | ORAL | Status: DC
  Filled 2019-04-09: qty 2

## 2019-04-09 MED ORDER — SODIUM CHLORIDE 0.9% FLUSH: 3.0000 mL | Freq: Two times a day (BID) | INTRAVENOUS | Status: DC

## 2019-04-09 MED ORDER — PHENYLEPHRINE 40 MCG/ML (10ML) SYRINGE FOR IV PUSH (FOR BLOOD PRESSURE SUPPORT)
PREFILLED_SYRINGE | INTRAVENOUS | Status: DC | PRN
  Administered 2019-04-09: 80 ug via INTRAVENOUS
  Administered 2019-04-09 (×2): 40 ug via INTRAVENOUS
  Administered 2019-04-09: 80 ug via INTRAVENOUS

## 2019-04-09 MED ORDER — PROPOFOL 10 MG/ML IV BOLUS
INTRAVENOUS | Status: DC | PRN
  Administered 2019-04-09: 130 mg via INTRAVENOUS

## 2019-04-09 MED ORDER — HYDROCODONE-ACETAMINOPHEN 5-325 MG PO TABS: 1.0000 | ORAL_TABLET | ORAL | Status: DC | PRN

## 2019-04-09 MED ORDER — HEPARIN (PORCINE) IN NACL 1000-0.9 UT/500ML-% IV SOLN
INTRAVENOUS | Status: AC
  Filled 2019-04-09: qty 500

## 2019-04-09 MED ORDER — FLUOXETINE HCL 20 MG PO CAPS: 40.0000 mg | ORAL_CAPSULE | Freq: Every day | ORAL | Status: DC

## 2019-04-09 MED ORDER — EPHEDRINE SULFATE-NACL 50-0.9 MG/10ML-% IV SOSY
PREFILLED_SYRINGE | INTRAVENOUS | Status: DC | PRN
  Administered 2019-04-09: 5 mg via INTRAVENOUS

## 2019-04-09 MED ORDER — AMLODIPINE BESYLATE 5 MG PO TABS: 5.0000 mg | ORAL_TABLET | Freq: Every evening | ORAL | Status: DC

## 2019-04-09 MED ORDER — METOPROLOL SUCCINATE ER 25 MG PO TB24: 25.0000 mg | ORAL_TABLET | Freq: Every day | ORAL | Status: DC

## 2019-04-09 MED ORDER — SODIUM CHLORIDE 0.9 % IV SOLN: 250.0000 mL | INTRAVENOUS | Status: DC | PRN

## 2019-04-09 MED ORDER — HEPARIN (PORCINE) IN NACL 2-0.9 UNITS/ML
INTRAMUSCULAR | Status: AC | PRN
  Administered 2019-04-09: 500 mL

## 2019-04-09 SURGICAL SUPPLY — 19 items
BLANKET WARM UNDERBOD FULL ACC (MISCELLANEOUS) ×2 IMPLANT
CATH MAPPNG PENTARAY F 2-6-2MM (CATHETERS) IMPLANT
CATH NAVISTAR SMARTTOUCH DF (ABLATOR) ×2 IMPLANT
CATH SOUNDSTAR ECO REPROCESSED (CATHETERS) ×2 IMPLANT
CATH WEBSTER BI DIR CS D-F CRV (CATHETERS) ×2 IMPLANT
COVER SWIFTLINK CONNECTOR (BAG) ×2 IMPLANT
LOOP REVEAL LINQSYS (Prosthesis & Implant Heart) ×2 IMPLANT
NDL BAYLIS TRANSSEPTAL 71CM (NEEDLE) IMPLANT
NEEDLE BAYLIS TRANSSEPTAL 71CM (NEEDLE) ×3 IMPLANT
PACK EP LATEX FREE (CUSTOM PROCEDURE TRAY) ×3
PACK EP LF (CUSTOM PROCEDURE TRAY) ×1 IMPLANT
PACK LOOP INSERTION (CUSTOM PROCEDURE TRAY) ×3 IMPLANT
PAD PRO RADIOLUCENT 2001M-C (PAD) ×3 IMPLANT
PATCH CARTO3 (PAD) ×2 IMPLANT
PENTARAY F 2-6-2MM (CATHETERS) ×3
SHEATH AVANTI 11F 11CM (SHEATH) ×2 IMPLANT
SHEATH PINNACLE 7F 10CM (SHEATH) ×4 IMPLANT
SHEATH SWARTZ TS SL2 63CM 8.5F (SHEATH) ×2 IMPLANT
TUBING SMART ABLATE COOLFLOW (TUBING) ×2 IMPLANT

## 2019-04-09 NOTE — Transfer of Care (Signed)
Immediate Anesthesia Transfer of Care Note  Patient: Troy Kindred [name]  Procedure(s) Performed: ATRIAL FIBRILLATION ABLATION (N/A ) LOOP RECORDER INSERTION (N/A ) LOOP RECORDER REMOVAL (N/A )  Patient Location: PACU  Anesthesia Type:General  Level of Consciousness: awake, oriented and patient cooperative  Airway & Oxygen Therapy: Patient Spontanous Breathing and Patient connected to nasal cannula oxygen  Post-op Assessment: Report given to RN and Post -op Vital signs reviewed and stable  Post vital signs: Reviewed  Last Vitals:  Vitals Value Taken Time  BP 128/48 04/09/19 1425  Temp 36.3 C 04/09/19 1426  Pulse 49 04/09/19 1426  Resp 8 04/09/19 1426  SpO2 98 % 04/09/19 1426  Vitals shown include unvalidated device data.  Last Pain:  Vitals:   04/09/19 1426  TempSrc: Temporal         Complications: No apparent anesthesia complications

## 2019-04-09 NOTE — Progress Notes (Addendum)
Rt groin site level 0. 3 way stop cock removed, suture cut and removed, gauze and tegaderm dressing applied. Instructions reviewed w/patient. IV saline locked.

## 2019-04-09 NOTE — Progress Notes (Signed)
Pt taken off bedrest. Ambulated with no issues. Groin level 0.

## 2019-04-09 NOTE — H&P (Signed)
Chief Complaint:  afib  History of Present Illness:    Troy Ohlin [name] is a 78 y.o. male who presents today for EPS and RFA of atrial fibrillation.  He has an ILR which is at RRT and also desires to have his ILR removed and a new device placed.  Since last being seen in our clinic, the patient reports doing reasonably well.  He reports increasing frequency and duration of afib.  + fatigue and decreased exercise tolerance during afib.  + tachypalpitations Today, he denies symptoms of chest pain, shortness of breath,  lower extremity edema, dizziness, presyncope, or syncope.  The patient is otherwise without complaint today.  The patient denies symptoms of fevers, chills, cough, or new SOB worrisome for COVID 19.  He is in sinus rhythm today.      Past Medical History:  Diagnosis Date  . AAA (abdominal aortic aneurysm) ([place]) LAST ABDOMINAL US 10-20-17 3.3 CM   MONITORED BY DR [name]  . Anxiety   . Arthritis   . Atrial fibrillation ([place])   . Basal cell carcinoma   . Bilateral carotid artery stenosis DUPLEX 12-29-2012  BY DR [facility]   BILATERAL ICA STENOSIS 60-79%  . BPH (benign prostatic hypertrophy)   . Chronic diastolic heart failure ([place])   . Chronic kidney disease    stage 3, pt unaware  . Colon cancer ([place])   . Complication of anesthesia    had nausea after carotid artery surgery, states the medicine given to help the nausea, made it worse  . COPD (chronic obstructive pulmonary disease) ([place])   . Depression   . GERD (gastroesophageal reflux disease)   . Glaucoma BOTH EYES   Dr Gershon Crane  . History of basal cell carcinoma excision   . History of lung cancer APRIL 2012  SQUAMOUS CELL---- S/P RIGHT LOWER LOBECTOMY AT [facility] --  NO CHEMORADIATION---  NO RECURRENCE    ONCOLOGIST- DR Tressie Stalker  LOV IN [facility] 10-27-2012  . History of pneumothorax    pt unaware  . Hx of adenomatous colonic polyps 2005    X 2; 1 hyperplastic polyp; Dr Olevia Perches  . Hyperlipidemia   .  Hypertension   . Impaired fasting glucose 2007   108; A1c5.4%  . Lesion of bladder   . Lung cancer ([place]) 01/25/2012  . Microhematuria   . OSA (obstructive sleep apnea) 08/29/2015   CPAP SET ON 10  . PAD (peripheral artery disease) ([place]) ABI'S  JAN 2014  0.65 ON RIGHT ;  1.04 ON LEFT  . Peripheral vascular disease ([place]) S/P ANGIOPLASTY AND STENTING   FOLLOWED  BY DR [name]  . Pleural effusion on right 11/18/2018   Chronic, noted on CXR  . Spinal stenosis Sept. 2015  . Status post placement of implantable loop recorder   . Stroke [facility]) Jul 07, 2013   mini  TIA  . Thoracic aorta atherosclerosis ([place])   . Thyrotoxicosis    amiodarone induced  . Urgency of urination          Past Surgical History:  Procedure Laterality Date  . ANGIO PLASTY     X 4 in legs  . AORTOGRAM  07-27-2002   MILD DIFFUSE ILIAC ARTERY OCCLUSIVE DISEASE /  LEFT RENAL ARTERY 20%/ PATENT LEFT FEM-POP GRAFT/ MILD SFA AND POPLITEAL ARTERY OCCLUSIVE DISEASE W/ SEVERE KIDNEY OCCLUSIVE DISEASE  . BASAL CELL CARCINOMA EXCISION     MULTIPLE TIMES--  RIGHT FOREARM, CHEEKS, AND BACK  . BIOPSY  06/25/2018   Procedure: BIOPSY;  Surgeon: Irving Copas., MD;  Location: [place];  Service: Gastroenterology;;  . CARDIOVASCULAR STRESS TEST  12-08-2012  DR MCLEAN   NORMAL LEXISCAN WITH NO EXERCISE NUCLEAR STUDY/ EF 66%/   NO ISCHEMIA/ NO SIGNIFICANT CHANGE FROM PRIOR STUDY  . CARDIOVERSION N/A 02/14/2018   Procedure: CARDIOVERSION;  Surgeon: Larey Dresser, MD;  Location: [facility] ENDOSCOPY;  Service: Cardiovascular;  Laterality: N/A;  . CAROTID ANGIOGRAM N/A 07/10/2013   Procedure: CAROTID ANGIOGRAM;  Surgeon: Elam Dutch, MD;  Location: [facility] CATH LAB;  Service: Cardiovascular;  Laterality: N/A;  . CAROTID ENDARTERECTOMY Right 07-14-13   cea  . CATARACT EXTRACTION W/ INTRAOCULAR LENS  IMPLANT, BILATERAL    . colon polyectomy    . COLONOSCOPY WITH PROPOFOL N/A 06/25/2018    Procedure: COLONOSCOPY WITH PROPOFOL;  Surgeon: Rush Landmark Telford Nab., MD;  Location: [place];  Service: Gastroenterology;  Laterality: N/A;  . CYSTOSCOPY W/ RETROGRADES Bilateral 01/21/2013   Procedure: CYSTOSCOPY WITH RETROGRADE PYELOGRAM;  Surgeon: Molli Hazard, MD;  Location: [facility];  Service: Urology;  Laterality: Bilateral;   CYSTO, BLADDER BIOPSY, BILATERAL RETROGRADE PYELOGRAM  RAD TECH FROM RADIOLOGY PER JOY  . CYSTOSCOPY WITH BIOPSY N/A 01/21/2013   Procedure: CYSTOSCOPY WITH BIOPSY;  Surgeon: Molli Hazard, MD;  Location: [facility];  Service: Urology;  Laterality: N/A;  . ENDARTERECTOMY Right 07/14/2013   Procedure: ENDARTERECTOMY CAROTID;  Surgeon: Angelia Mould, MD;  Location: [place];  Service: Vascular;  Laterality: Right;  . EP IMPLANTABLE DEVICE N/A 08/12/2015   Procedure: Loop Recorder Insertion;  Surgeon: [place] Grayer, MD;  Location: [place] CV LAB;  Service: Cardiovascular;  Laterality: N/A;  . ESOPHAGOGASTRODUODENOSCOPY (EGD) WITH PROPOFOL N/A 06/25/2018   Procedure: ESOPHAGOGASTRODUODENOSCOPY (EGD) WITH PROPOFOL;  Surgeon: Rush Landmark Telford Nab., MD;  Location: [place];  Service: Gastroenterology;  Laterality: N/A;  . ESOPHAGOGASTRODUODENOSCOPY (EGD) WITH PROPOFOL N/A 07/10/2018   Procedure: ESOPHAGOGASTRODUODENOSCOPY (EGD) WITH PROPOFOL;  Surgeon: Milus Banister, MD;  Location: [facility] ENDOSCOPY;  Service: Endoscopy;  Laterality: N/A;  . EUS N/A 07/10/2018   Procedure: UPPER ENDOSCOPIC ULTRASOUND (EUS) RADIAL;  Surgeon: Milus Banister, MD;  Location: [facility] ENDOSCOPY;  Service: Endoscopy;  Laterality: N/A;  . EYE SURGERY Right   . FEMORAL-POPLITEAL BYPASS GRAFT Left 1994  [facility]   AND 2001  IN [place]  . FEMORAL-POPLITEAL BYPASS GRAFT Left 08/30/2015   Procedure: REVISION OF BYPASS GRAFT Left  FEMORAL-POPLITEAL ARTERY;  Surgeon: Angelia Mould, MD;  Location: [place];  Service:  Vascular;  Laterality: Left;  . FINE NEEDLE ASPIRATION N/A 07/10/2018   Procedure: FINE NEEDLE ASPIRATION (FNA) LINEAR;  Surgeon: Milus Banister, MD;  Location: [facility] ENDOSCOPY;  Service: Endoscopy;  Laterality: N/A;  . FLEXIBLE SIGMOIDOSCOPY N/A 12/12/2018   Procedure: FLEXIBLE SIGMOIDOSCOPY;  Surgeon: Ileana Roup, MD;  Location: [facility] ORS;  Service: General;  Laterality: N/A;  . LARYNGOSCOPY  06-27-2004   BX VOCAL CORD  (LEUKOPLAKIA)  PER PT NO ISSUES SINCE  . LEFT HEART CATH AND CORONARY ANGIOGRAPHY N/A 08/20/2017   Procedure: LEFT HEART CATH AND CORONARY ANGIOGRAPHY;  Surgeon: Larey Dresser, MD;  Location: [place] CV LAB;  Service: Cardiovascular;  Laterality: N/A;  . LOWER EXTREMITY ANGIOGRAM Bilateral 08/29/2015   Procedure: Lower Extremity Angiogram;  Surgeon: Angelia Mould, MD;  Location: [place] CV LAB;  Service: Cardiovascular;  Laterality: Bilateral;  . LUNG LOBECTOMY  01/24/2011    RIGHT UPPER LOBE  (SQUAMOUS CELL CARCINOMA) Dr Dorthea Cove , [facility].  No chemotherapyor radiation  . PATCH ANGIOPLASTY Right 07/14/2013   Procedure: PATCH ANGIOPLASTY;  Surgeon: Angelia Mould, MD;  Location: [place];  Service: Vascular;  Laterality: Right;  . PATCH ANGIOPLASTY Left 08/30/2015   Procedure: VEIN PATCH ANGIOPLASTY OF PROXIMAL Left BYPASS GRAFT;  Surgeon: Angelia Mould, MD;  Location: [place];  Service: Vascular;  Laterality: Left;  . PERIPHERAL VASCULAR CATHETERIZATION N/A 08/29/2015   Procedure: Abdominal Aortogram;  Surgeon: Angelia Mould, MD;  Location: [place] CV LAB;  Service: Cardiovascular;  Laterality: N/A;  . POLYPECTOMY  06/25/2018   Procedure: POLYPECTOMY;  Surgeon: Rush Landmark Telford Nab., MD;  Location: [place];  Service: Gastroenterology;;  . Lia Foyer INJECTION  06/25/2018   Procedure: SUBMUCOSAL INJECTION;  Surgeon: Irving Copas., MD;  Location: [place];  Service: Gastroenterology;;  . TEE WITHOUT  CARDIOVERSION N/A 02/14/2018   Procedure: TRANSESOPHAGEAL ECHOCARDIOGRAM (TEE);  Surgeon: Larey Dresser, MD;  Location: [facility] ENDOSCOPY;  Service: Cardiovascular;  Laterality: N/A;  . trabecular surgery     OS  . TRANSTHORACIC ECHOCARDIOGRAM  12-29-2012  DR [facility]   MILD LVH/  LVSF NORMAL/ EF 00-93%/  GRADE I DIASTOLIC DYSFUNCTION          Current Outpatient Medications  Medication Sig Dispense Refill  . acetaminophen (TYLENOL) 325 MG tablet Take 650 mg every 6 (six) hours as needed by mouth for moderate pain or headache.    . albuterol (PROVENTIL HFA;VENTOLIN HFA) 108 (90 Base) MCG/ACT inhaler Inhale 2 puffs into the lungs every 6 (six) hours as needed for wheezing or shortness of breath. 1 Inhaler 0  . apixaban (ELIQUIS) 5 MG TABS tablet Take 1 tablet (5 mg total) by mouth 2 (two) times daily. 60 tablet 6  . bimatoprost (LUMIGAN) 0.01 % SOLN Place 1 drop at bedtime into both eyes.     [name] docusate sodium (COLACE) 100 MG capsule Take 100 mg by mouth daily.     . dorzolamide-timolol (COSOPT) 22.3-6.8 MG/ML ophthalmic solution Place 2 drops into both eyes 2 (two) times daily.     . ferrous sulfate 325 (65 FE) MG tablet Take 325 mg by mouth at bedtime.     [name] FLUoxetine (PROZAC) 40 MG capsule Take 1 capsule (40 mg total) by mouth daily. 30 capsule 0  . furosemide (LASIX) 20 MG tablet Take 3 tabs (60 mg) for 3 days, then 2 tabs (40 mg) daily. (Patient taking differently: Take 40 mg by mouth daily. ) 75 tablet 3  . Magnesium 500 MG CAPS Take 500 mg by mouth daily.     . Melatonin 5 MG TABS Take 5 mg by mouth at bedtime.    . methimazole (TAPAZOLE) 5 MG tablet Take 1 tablet (5 mg total) by mouth daily. 45 tablet 3  . montelukast (SINGULAIR) 10 MG tablet TAKE ONE TABLET AT BEDTIME. 30 tablet 2  . omeprazole (PRILOSEC) 20 MG capsule Take 1 capsule (20 mg total) by mouth daily. 90 capsule 3  . Polyethyl Glycol-Propyl Glycol (SYSTANE) 0.4-0.3 % SOLN Place 1 drop into both eyes daily  as needed (for dry eyes).     . potassium chloride SA (K-DUR,KLOR-CON) 20 MEQ tablet Take 1 tablet (20 mEq total) by mouth daily. 30 tablet 11  . rosuvastatin (CRESTOR) 40 MG tablet TAKE 1 TABLET BY MOUTH DAILY. 90 tablet 0  . senna (SENOKOT) 8.6 MG tablet Take 1 tablet by mouth 2 (two) times daily.    . SYMBICORT 160-4.5 MCG/ACT inhaler USE 2 PUFFS TWICE DAILY.  1 Inhaler 0  . amLODipine (NORVASC) 5 MG tablet Take 1 tablet (5 mg total) by mouth daily. 30 tablet 11  . metoprolol succinate (TOPROL-XL) 25 MG 24 hr tablet Take 1 tablet (25 mg total) by mouth daily. Take with or immediately following a meal. 90 tablet 3   No current facility-administered medications for this visit.     Allergies:   Niacin-lovastatin er; Penicillins; Sulfonamide derivatives; and Atorvastatin   Social History:  The patient  reports that he quit smoking about 4 years ago. His smoking use included cigarettes. He has a 25.00 pack-year smoking history. He has never used smokeless tobacco. He reports current alcohol use. He reports that he does not use drugs.   Family History:  The patient's  family history includes Alcohol abuse in his father; Cancer (age of onset: 48) in his daughter; Heart attack in his father; Heart disease in his father, paternal aunt, and paternal uncle; Hypertension in his father and paternal aunt; Stroke in his father, mother, paternal aunt, and paternal uncle.   ROS:  Please see the history of present illness.   All other systems are personally reviewed and negative.   Physical Exam: Vitals:   04/09/19 0845  BP: (!) 153/63  Pulse: (!) 55  Temp: (!) 96.7 F (35.9 C)  TempSrc: Oral  SpO2: 98%  Weight: 86.6 kg  Height: 6\' 1"  (1.854 m)    GEN- The patient is well appearing, alert and oriented x 3 today.   Head- normocephalic, atraumatic Eyes-  Sclera clear, conjunctiva pink Ears- hearing intact Oropharynx- clear Neck- supple, Lungs- Clear to ausculation bilaterally, normal  work of breathing Heart- Regular rate and rhythm, no murmurs, rubs or gallops, PMI not laterally displaced GI- soft, NT, ND, + BS Extremities- no clubbing, cyanosis, or edema, groin is without hematoma/ bruit MS- no significant deformity or atrophy Skin- no rash or lesion Psych- euthymic mood, full affect Neuro- strength and sensation are intact   Labs/Other Tests and Data Reviewed:    Recent Labs: 07/14/2018: Pro B Natriuretic peptide (BNP) 279.0 11/18/2018: B Natriuretic Peptide 387.8 12/03/2018: ALT 26 12/16/2018: BUN 13; Creatinine, Ser 0.99; Magnesium 2.0; Potassium 4.0; Sodium 137 01/29/2019: Hemoglobin 13.0; Platelets 352; TSH 11.765      Wt Readings from Last 3 Encounters:  02/11/19 188 lb (85.3 kg)  01/29/19 189 lb 12.8 oz (86.1 kg)  12/19/18 183 lb 13.8 oz (83.4 kg)     Other studies personally reviewed: Additional studies/ records that were reviewed today include: AF clinic notes, CHF clinic notes,  My prior notes  Review of the above records today demonstrates: as above TEE 02/14/18- preserved EF    ASSESSMENT & PLAN:    1.  Paroxysmal atrial fibrillation Increased symptoms of afib The patient has symptomatic, recurrent paroxysmal atrial fibrillation. he has failed medical therapy with amiodarone. Chads2vasc score is eliquis.  he is anticoagulated with 6 .  He reports compliance with eliquis without interruption over the past 4 weeks.  He is in sinus rhythm today.  Risk, benefits, and alternatives to EP study and radiofrequency ablation for afib as well as ILR removal and reimplantation were also discussed in detail today. These risks include but are not limited to stroke, bleeding, vascular damage, tamponade, perforation, damage to the esophagus, lungs, and other structures, pulmonary vein stenosis, worsening renal function, and death. The patient understands these risk and wishes to proceed.   [place] Grayer MD, [place] 04/09/2019 11:04 AM

## 2019-04-09 NOTE — Anesthesia Procedure Notes (Signed)
Procedure Name: Intubation Date/Time: 04/09/2019 11:48 AM Performed by: Barrington Ellison, CRNA Pre-anesthesia Checklist: Patient identified, Emergency Drugs available, Suction available and Patient being monitored Patient Re-evaluated:Patient Re-evaluated prior to induction Oxygen Delivery Method: Circle System Utilized Preoxygenation: Pre-oxygenation with 100% oxygen Induction Type: IV induction and Rapid sequence Ventilation: Mask ventilation without difficulty Laryngoscope Size: Mac and 4 Grade View: Grade I Tube type: Oral Tube size: 7.5 mm Number of attempts: 1 Airway Equipment and Method: Stylet and Oral airway Placement Confirmation: ETT inserted through vocal cords under direct vision,  positive ETCO2 and breath sounds checked- equal and bilateral Secured at: 22 cm Tube secured with: Tape Dental Injury: Teeth and Oropharynx as per pre-operative assessment

## 2019-04-09 NOTE — Anesthesia Postprocedure Evaluation (Signed)
Anesthesia Post Note  Patient: Troy Binette [name]  Procedure(s) Performed: ATRIAL FIBRILLATION ABLATION (N/A ) LOOP RECORDER INSERTION (N/A ) LOOP RECORDER REMOVAL (N/A )     Patient location during evaluation: PACU Anesthesia Type: General Level of consciousness: awake and alert Pain management: pain level controlled Vital Signs Assessment: post-procedure vital signs reviewed and stable Respiratory status: spontaneous breathing, nonlabored ventilation, respiratory function stable and patient connected to nasal cannula oxygen Cardiovascular status: blood pressure returned to baseline and stable Postop Assessment: no apparent nausea or vomiting Anesthetic complications: no    Last Vitals:  Vitals:   04/09/19 1608 04/09/19 1620  BP: (!) 143/73 125/72  Pulse: (!) 52 (!) 47  Resp: 12 14  Temp:    SpO2: 96% 97%    Last Pain:  Vitals:   04/09/19 1620  TempSrc:   PainSc: 0-No pain                 Catalina Gravel

## 2019-04-09 NOTE — Anesthesia Preprocedure Evaluation (Addendum)
Anesthesia Evaluation  Patient identified by MRN, date of birth, ID band Patient awake    Reviewed: Allergy & Precautions, NPO status , Patient's Chart, lab work & pertinent test results, reviewed documented beta blocker date and time   History of Anesthesia Complications (+) PONV and history of anesthetic complications  Airway Mallampati: II  TM Distance: >3 FB Neck ROM: Full    Dental  (+) Teeth Intact, Dental Advisory Given   Pulmonary sleep apnea and Continuous Positive Airway Pressure Ventilation , COPD,  COPD inhaler, former smoker,  H/o lung CA s/p RLL lobectomy   Pulmonary exam normal breath sounds clear to auscultation       Cardiovascular hypertension, Pt. on medications and Pt. on home beta blockers (-) angina+ CAD, + Peripheral Vascular Disease, +CHF and + DOE  (-) Past MI and (-) Cardiac Stents Normal cardiovascular exam+ dysrhythmias Atrial Fibrillation  Rhythm:Regular Rate:Normal  Echo 02/26/2018: Study Conclusions  - Left ventricle: The cavity size was normal. Wall thickness was increased in a pattern of mild LVH. Systolic function was normal. The estimated ejection fraction was in the range of 55% to 60%. Wall motion was normal; there were no regional wall motion abnormalities. No evidence of thrombus. - Aortic valve: There was no stenosis. - Aorta: Normal caliber thoracic aorta with minimal plaque. - Mitral valve: There was mild regurgitation. - Left atrium: The atrium was mildly dilated. No evidence of thrombus in the atrial cavity or appendage. No evidence of thrombus in the atrial cavity or appendage. - Right ventricle: The cavity size was normal. Systolic function was normal. - Right atrium: No evidence of thrombus in the atrial cavity or appendage. - Atrial septum: No evidence for PFO or ASD by color doppler.   Neuro/Psych PSYCHIATRIC DISORDERS Anxiety Depression TIACVA, No Residual Symptoms     GI/Hepatic Neg liver ROS, GERD  Medicated,  Endo/Other  negative endocrine ROS  Renal/GU Renal InsufficiencyRenal disease     Musculoskeletal  (+) Arthritis ,   Abdominal   Peds  Hematology  (+) Blood dyscrasia (Eliquis), anemia ,   Anesthesia Other Findings Day of surgery medications reviewed with the patient.  Reproductive/Obstetrics                            Anesthesia Physical Anesthesia Plan  ASA: III  Anesthesia Plan: General   Post-op Pain Management:    Induction: Intravenous  PONV Risk Score and Plan: 3 and Dexamethasone and Ondansetron  Airway Management Planned: Oral ETT  Additional Equipment:   Intra-op Plan:   Post-operative Plan: Extubation in OR  Informed Consent: I have reviewed the patients History and Physical, chart, labs and discussed the procedure including the risks, benefits and alternatives for the proposed anesthesia with the patient or authorized representative who has indicated his/her understanding and acceptance.     Dental advisory given  Plan Discussed with: CRNA  Anesthesia Plan Comments:         Anesthesia Quick Evaluation

## 2019-04-09 NOTE — Discharge Instructions (Signed)
Post implant site/wound care instructions Keep incision clean and dry for 3 days. You can remove outer dressing tomorrow. Leave steri-strips (little pieces of tape) on until seen in the office for wound check appointment. Call the office [phone]) for redness, drainage, swelling, or fever.   Post ablation care instructions No driving for 4 days. No lifting over 5 lbs for 1 week. No vigorous or sexual activity for 1 week. You may return to work on 04/16/2019. Keep procedure site clean & dry. If you notice increased pain, swelling, bleeding or pus, call/return!  You may shower, but no soaking baths/hot tubs/pools for 1 week.    You have an appointment set up with the [facility].  Multiple studies have shown that being followed by a dedicated atrial fibrillation clinic in addition to the standard care you receive from your other physicians improves health. We believe that enrollment in the atrial fibrillation clinic will allow [name] to better care for you.   The phone number to the [facility] is [phone]. The clinic is staffed Monday through Friday from 8:30am to 5pm.  Parking Directions: The clinic is located in the Heart and Vascular Building connected to [facility]. 1)From [address] turn on to [address] and go to the 3rd entrance  (Heart and Vascular entrance) on the right. 2)Look to the right for Heart &Vascular Parking Garage. 3)A code for the entrance is required July's code is 9009   4)Take the elevators to the 1st floor. Registration is in the room with the glass walls at the end of the hallway.  If you have any trouble parking or locating the clinic, please dont hesitate to call [phone].

## 2019-04-10 ENCOUNTER — Encounter (HOSPITAL_COMMUNITY): Payer: Self-pay | Admitting: Internal Medicine

## 2019-04-20 ENCOUNTER — Telehealth: Payer: Self-pay | Admitting: Student

## 2019-04-20 NOTE — Telephone Encounter (Signed)
Pt's wife answered no to all screening questions.      COVID-19 Pre-Screening Questions:  . In the past 7 to 10 days have you had a cough,  shortness of breath, headache, congestion, fever (100 or greater) body aches, chills, sore throat, or sudden loss of taste or sense of smell? . Have you been around anyone with known Covid 19. . Have you been around anyone who is awaiting Covid 19 test results in the past 7 to 10 days? . Have you been around anyone who has been exposed to Covid 19, or has mentioned symptoms of Covid 19 within the past 7 to 10 days?  If you have any concerns/questions about symptoms patients report during screening (either on the phone or at threshold). Contact the provider seeing the patient or DOD for further guidance.  If neither are available contact a member of the leadership team.

## 2019-04-21 ENCOUNTER — Ambulatory Visit (INDEPENDENT_AMBULATORY_CARE_PROVIDER_SITE_OTHER): Payer: Commercial Managed Care - PPO | Admitting: Student

## 2019-04-21 ENCOUNTER — Other Ambulatory Visit: Payer: Self-pay

## 2019-04-21 DIAGNOSIS — I480 Paroxysmal atrial fibrillation: Secondary | ICD-10-CM

## 2019-04-21 LAB — CUP PACEART INCLINIC DEVICE CHECK
Date Time Interrogation Session: 20200721144405
Implantable Pulse Generator Implant Date: 20200709

## 2019-04-21 NOTE — Progress Notes (Signed)
ILR wound check in clinic. Steri strips removed. Wound well healed. Home monitor transmitting nightly. R wave 0.19 mV. 16 AF episodes, longest 1hr 30 minutes s/p AF ablation 04/09/2019. Total burden 1.1% Questions answered. Sees Afib clinic 04/21/2019. Next remote 05/12/2019   Beryle Beams" [place], PA-C 04/21/2019 2:43 PM

## 2019-05-04 ENCOUNTER — Encounter: Payer: Self-pay | Admitting: Oncology

## 2019-05-06 DIAGNOSIS — C187 Malignant neoplasm of sigmoid colon: Secondary | ICD-10-CM | POA: Diagnosis not present

## 2019-05-07 ENCOUNTER — Ambulatory Visit (HOSPITAL_COMMUNITY)
Admission: RE | Admit: 2019-05-07 | Discharge: 2019-05-07 | Disposition: A | Discharge status: Home / Self Care | Payer: PPO | Source: Ambulatory Visit | Attending: Physician Assistant | Admitting: Physician Assistant

## 2019-05-07 ENCOUNTER — Other Ambulatory Visit: Payer: Self-pay

## 2019-05-07 ENCOUNTER — Encounter (HOSPITAL_COMMUNITY): Payer: Self-pay | Admitting: Physician Assistant

## 2019-05-07 VITALS — BP 144/64 | HR 51 | Ht 73.0 in | Wt 199.0 lb

## 2019-05-07 DIAGNOSIS — Z85118 Personal history of other malignant neoplasm of bronchus and lung: Secondary | ICD-10-CM | POA: Diagnosis not present

## 2019-05-07 DIAGNOSIS — I5032 Chronic diastolic (congestive) heart failure: Secondary | ICD-10-CM | POA: Insufficient documentation

## 2019-05-07 DIAGNOSIS — Z85038 Personal history of other malignant neoplasm of large intestine: Secondary | ICD-10-CM | POA: Diagnosis not present

## 2019-05-07 DIAGNOSIS — Z85828 Personal history of other malignant neoplasm of skin: Secondary | ICD-10-CM | POA: Diagnosis not present

## 2019-05-07 DIAGNOSIS — I6523 Occlusion and stenosis of bilateral carotid arteries: Secondary | ICD-10-CM | POA: Insufficient documentation

## 2019-05-07 DIAGNOSIS — I2510 Atherosclerotic heart disease of native coronary artery without angina pectoris: Secondary | ICD-10-CM | POA: Insufficient documentation

## 2019-05-07 DIAGNOSIS — I130 Hypertensive heart and chronic kidney disease with heart failure and stage 1 through stage 4 chronic kidney disease, or unspecified chronic kidney disease: Secondary | ICD-10-CM | POA: Insufficient documentation

## 2019-05-07 DIAGNOSIS — Z7901 Long term (current) use of anticoagulants: Secondary | ICD-10-CM | POA: Diagnosis not present

## 2019-05-07 DIAGNOSIS — Z888 Allergy status to other drugs, medicaments and biological substances status: Secondary | ICD-10-CM | POA: Diagnosis not present

## 2019-05-07 DIAGNOSIS — Z882 Allergy status to sulfonamides status: Secondary | ICD-10-CM | POA: Insufficient documentation

## 2019-05-07 DIAGNOSIS — R7301 Impaired fasting glucose: Secondary | ICD-10-CM | POA: Insufficient documentation

## 2019-05-07 DIAGNOSIS — J449 Chronic obstructive pulmonary disease, unspecified: Secondary | ICD-10-CM | POA: Diagnosis not present

## 2019-05-07 DIAGNOSIS — I480 Paroxysmal atrial fibrillation: Secondary | ICD-10-CM | POA: Diagnosis not present

## 2019-05-07 DIAGNOSIS — H409 Unspecified glaucoma: Secondary | ICD-10-CM | POA: Insufficient documentation

## 2019-05-07 DIAGNOSIS — I491 Atrial premature depolarization: Secondary | ICD-10-CM | POA: Insufficient documentation

## 2019-05-07 DIAGNOSIS — I739 Peripheral vascular disease, unspecified: Secondary | ICD-10-CM | POA: Insufficient documentation

## 2019-05-07 DIAGNOSIS — G4733 Obstructive sleep apnea (adult) (pediatric): Secondary | ICD-10-CM | POA: Insufficient documentation

## 2019-05-07 DIAGNOSIS — Z79899 Other long term (current) drug therapy: Secondary | ICD-10-CM | POA: Insufficient documentation

## 2019-05-07 DIAGNOSIS — I700 Atherosclerosis of aorta: Secondary | ICD-10-CM | POA: Insufficient documentation

## 2019-05-07 DIAGNOSIS — Z880 Allergy status to penicillin: Secondary | ICD-10-CM | POA: Insufficient documentation

## 2019-05-07 DIAGNOSIS — N183 Chronic kidney disease, stage 3 (moderate): Secondary | ICD-10-CM | POA: Diagnosis not present

## 2019-05-07 DIAGNOSIS — K219 Gastro-esophageal reflux disease without esophagitis: Secondary | ICD-10-CM | POA: Insufficient documentation

## 2019-05-07 DIAGNOSIS — E785 Hyperlipidemia, unspecified: Secondary | ICD-10-CM | POA: Insufficient documentation

## 2019-05-07 DIAGNOSIS — M1990 Unspecified osteoarthritis, unspecified site: Secondary | ICD-10-CM | POA: Diagnosis not present

## 2019-05-07 DIAGNOSIS — E0590 Thyrotoxicosis, unspecified without thyrotoxic crisis or storm: Secondary | ICD-10-CM | POA: Diagnosis not present

## 2019-05-07 DIAGNOSIS — Z87891 Personal history of nicotine dependence: Secondary | ICD-10-CM | POA: Insufficient documentation

## 2019-05-07 DIAGNOSIS — Z8249 Family history of ischemic heart disease and other diseases of the circulatory system: Secondary | ICD-10-CM | POA: Insufficient documentation

## 2019-05-07 DIAGNOSIS — Z803 Family history of malignant neoplasm of breast: Secondary | ICD-10-CM | POA: Insufficient documentation

## 2019-05-07 NOTE — Progress Notes (Signed)
Primary Care Physician: Binnie Rail, MD Referring Physician: Darrick Grinder, NP/[name] EP: Dr. Achilles Dunk Troy [name]. is a 78 y.o. male with a h/o HTN, COPD,  carotid stenosis s/p right CEA, paroxysmal atrial fibrillation, and PAD, obstructive CAD, returns for followup of CHF and atrial fibrillation.He had a left fem-pop bypass at the [facility] in the '90s. He is followed at [facility] for PAD. He has chronic right calf/thigh/buttocks claudication that is unchanged over the last few years and follows regularly at [facility]. He has had right lower lobectomy for lung cancer. He continues to stay off cigarettes. He had been on amiodarone until last October when he was stopped for hyperthyroidism and possible lung changes.  In 06/19/18, he was admitted with GI bleeding and found to have a sigmoid mass on colonoscopy, path showed sigmoid adenocarcinoma. He was noted to have right paratracheal lymphadenopathy but EUS with biopsy in 10/19 did not show malignancy. Eliquis was stopped with bleeding sigmoid mass. He was noted to be back in atrial fibrillation that admission.   He had a virtual appointment with Darrick Grinder, NP, 4/27 and Chanetta Marshall, NP, had relayed that his LinQ revealed 23% afib burden. Pt had noted some increased fatigue and elevated heart rates at home. He was started back on his eliquis 5 mg bid and set up for this visit.  Follow up in AF clinic 05/07/19. Patient is s/p afib ablation and ILR implant with Dr Rayann Heman 04/09/19. He reports that he is doing very well since his procedure. He reports that he has much more energy now. He denies any heart racing or palpitations. He also denies chest pain, groin or swallowing issues.  Today, he denies symptoms of palpitations, chest pain, shortness of breath, orthopnea, PND, lower extremity edema, dizziness, presyncope, syncope, or neurologic sequela. The patient is tolerating medications without difficulties and is otherwise without complaint  today.   Past Medical History:  Diagnosis Date  . AAA (abdominal aortic aneurysm) ([place]) LAST ABDOMINAL US 10-20-17 3.3 CM   MONITORED BY DR [name]  . Anxiety   . Arthritis   . Atrial fibrillation ([place])   . Basal cell carcinoma   . Bilateral carotid artery stenosis DUPLEX 12-29-2012  BY DR [facility]   BILATERAL ICA STENOSIS 60-79%  . BPH (benign prostatic hypertrophy)   . Chronic diastolic heart failure ([place])   . Chronic kidney disease    stage 3, pt unaware  . Colon cancer ([place])   . Complication of anesthesia    had nausea after carotid artery surgery, states the medicine given to help the nausea, made it worse  . COPD (chronic obstructive pulmonary disease) ([place])   . Depression   . GERD (gastroesophageal reflux disease)   . Glaucoma BOTH EYES   Dr Gershon Crane  . History of basal cell carcinoma excision   . History of lung cancer APRIL 2012  SQUAMOUS CELL---- S/P RIGHT LOWER LOBECTOMY AT [facility] --  NO CHEMORADIATION---  NO RECURRENCE    ONCOLOGIST- DR Tressie Stalker  LOV IN [facility] 10-27-2012  . History of pneumothorax    pt unaware  . Hx of adenomatous colonic polyps 2005    X 2; 1 hyperplastic polyp; Dr Olevia Perches  . Hyperlipidemia   . Hypertension   . Impaired fasting glucose 2007   108; A1c5.4%  . Lesion of bladder   . Lung cancer ([place]) 01/25/2012  . Microhematuria   . OSA (obstructive sleep apnea) 08/29/2015   CPAP SET ON 10  .  PAD (peripheral artery disease) ([place]) ABI'S  JAN 2014  0.65 ON RIGHT ;  1.04 ON LEFT  . Peripheral vascular disease ([place]) S/P ANGIOPLASTY AND STENTING   FOLLOWED  BY DR [name]  . Pleural effusion on right 11/18/2018   Chronic, noted on CXR  . Spinal stenosis Sept. 2015  . Status post placement of implantable loop recorder   . Stroke [facility]) Jul 07, 2013   mini  TIA  . Thoracic aorta atherosclerosis ([place])   . Thyrotoxicosis    amiodarone induced  . Urgency of urination    Past Surgical History:  Procedure Laterality Date  . ANGIO PLASTY     X 4 in legs   . AORTOGRAM  07-27-2002   MILD DIFFUSE ILIAC ARTERY OCCLUSIVE DISEASE /  LEFT RENAL ARTERY 20%/ PATENT LEFT FEM-POP GRAFT/ MILD SFA AND POPLITEAL ARTERY OCCLUSIVE DISEASE W/ SEVERE KIDNEY OCCLUSIVE DISEASE  . ATRIAL FIBRILLATION ABLATION N/A 04/09/2019   Procedure: ATRIAL FIBRILLATION ABLATION;  Surgeon: Thompson Grayer, MD;  Location: [place] CV LAB;  Service: Cardiovascular;  Laterality: N/A;  . BASAL CELL CARCINOMA EXCISION     MULTIPLE TIMES--  RIGHT FOREARM, CHEEKS, AND BACK  . BIOPSY  06/25/2018   Procedure: BIOPSY;  Surgeon: Rush Landmark Telford Nab., MD;  Location: [place];  Service: Gastroenterology;;  . CARDIOVASCULAR STRESS TEST  12-08-2012  DR MCLEAN   NORMAL LEXISCAN WITH NO EXERCISE NUCLEAR STUDY/ EF 66%/   NO ISCHEMIA/ NO SIGNIFICANT CHANGE FROM PRIOR STUDY  . CARDIOVERSION N/A 02/14/2018   Procedure: CARDIOVERSION;  Surgeon: Larey Dresser, MD;  Location: [facility] ENDOSCOPY;  Service: Cardiovascular;  Laterality: N/A;  . CAROTID ANGIOGRAM N/A 07/10/2013   Procedure: CAROTID ANGIOGRAM;  Surgeon: Elam Dutch, MD;  Location: [facility] CATH LAB;  Service: Cardiovascular;  Laterality: N/A;  . CAROTID ENDARTERECTOMY Right 07-14-13   cea  . CATARACT EXTRACTION W/ INTRAOCULAR LENS  IMPLANT, BILATERAL    . colon polyectomy    . COLONOSCOPY WITH PROPOFOL N/A 06/25/2018   Procedure: COLONOSCOPY WITH PROPOFOL;  Surgeon: Rush Landmark Telford Nab., MD;  Location: [place];  Service: Gastroenterology;  Laterality: N/A;  . CYSTOSCOPY W/ RETROGRADES Bilateral 01/21/2013   Procedure: CYSTOSCOPY WITH RETROGRADE PYELOGRAM;  Surgeon: Molli Hazard, MD;  Location: [facility];  Service: Urology;  Laterality: Bilateral;   CYSTO, BLADDER BIOPSY, BILATERAL RETROGRADE PYELOGRAM  RAD TECH FROM RADIOLOGY PER JOY  . CYSTOSCOPY WITH BIOPSY N/A 01/21/2013   Procedure: CYSTOSCOPY WITH BIOPSY;  Surgeon: Molli Hazard, MD;  Location: [facility];  Service: Urology;   Laterality: N/A;  . ENDARTERECTOMY Right 07/14/2013   Procedure: ENDARTERECTOMY CAROTID;  Surgeon: Angelia Mould, MD;  Location: [place];  Service: Vascular;  Laterality: Right;  . EP IMPLANTABLE DEVICE N/A 08/12/2015   Procedure: Loop Recorder Insertion;  Surgeon: Thompson Grayer, MD;  Location: [place] CV LAB;  Service: Cardiovascular;  Laterality: N/A;  . ESOPHAGOGASTRODUODENOSCOPY (EGD) WITH PROPOFOL N/A 06/25/2018   Procedure: ESOPHAGOGASTRODUODENOSCOPY (EGD) WITH PROPOFOL;  Surgeon: Rush Landmark Telford Nab., MD;  Location: [place];  Service: Gastroenterology;  Laterality: N/A;  . ESOPHAGOGASTRODUODENOSCOPY (EGD) WITH PROPOFOL N/A 07/10/2018   Procedure: ESOPHAGOGASTRODUODENOSCOPY (EGD) WITH PROPOFOL;  Surgeon: Milus Banister, MD;  Location: [facility] ENDOSCOPY;  Service: Endoscopy;  Laterality: N/A;  . EUS N/A 07/10/2018   Procedure: UPPER ENDOSCOPIC ULTRASOUND (EUS) RADIAL;  Surgeon: Milus Banister, MD;  Location: [facility] ENDOSCOPY;  Service: Endoscopy;  Laterality: N/A;  . EYE SURGERY Right   . FEMORAL-POPLITEAL BYPASS  GRAFT Left 1994  [facility]   AND 2001  IN [place]  . FEMORAL-POPLITEAL BYPASS GRAFT Left 08/30/2015   Procedure: REVISION OF BYPASS GRAFT Left  FEMORAL-POPLITEAL ARTERY;  Surgeon: Angelia Mould, MD;  Location: [place];  Service: Vascular;  Laterality: Left;  . FINE NEEDLE ASPIRATION N/A 07/10/2018   Procedure: FINE NEEDLE ASPIRATION (FNA) LINEAR;  Surgeon: Milus Banister, MD;  Location: [facility] ENDOSCOPY;  Service: Endoscopy;  Laterality: N/A;  . FLEXIBLE SIGMOIDOSCOPY N/A 12/12/2018   Procedure: FLEXIBLE SIGMOIDOSCOPY;  Surgeon: Ileana Roup, MD;  Location: [facility] ORS;  Service: General;  Laterality: N/A;  . LARYNGOSCOPY  06-27-2004   BX VOCAL CORD  (LEUKOPLAKIA)  PER PT NO ISSUES SINCE  . LEFT HEART CATH AND CORONARY ANGIOGRAPHY N/A 08/20/2017   Procedure: LEFT HEART CATH AND CORONARY ANGIOGRAPHY;  Surgeon: Larey Dresser, MD;  Location: [place] CV LAB;   Service: Cardiovascular;  Laterality: N/A;  . LOOP RECORDER INSERTION N/A 04/09/2019   Procedure: LOOP RECORDER INSERTION;  Surgeon: Thompson Grayer, MD;  Location: [place] CV LAB;  Service: Cardiovascular;  Laterality: N/A;  . LOOP RECORDER REMOVAL N/A 04/09/2019   Procedure: LOOP RECORDER REMOVAL;  Surgeon: Thompson Grayer, MD;  Location: [place] CV LAB;  Service: Cardiovascular;  Laterality: N/A;  . LOWER EXTREMITY ANGIOGRAM Bilateral 08/29/2015   Procedure: Lower Extremity Angiogram;  Surgeon: Angelia Mould, MD;  Location: [place] CV LAB;  Service: Cardiovascular;  Laterality: Bilateral;  . LUNG LOBECTOMY  01/24/2011    RIGHT UPPER LOBE  (SQUAMOUS CELL CARCINOMA) Dr Dorthea Cove , [facility]. No chemotherapyor radiation  . PATCH ANGIOPLASTY Right 07/14/2013   Procedure: PATCH ANGIOPLASTY;  Surgeon: Angelia Mould, MD;  Location: [place];  Service: Vascular;  Laterality: Right;  . PATCH ANGIOPLASTY Left 08/30/2015   Procedure: VEIN PATCH ANGIOPLASTY OF PROXIMAL Left BYPASS GRAFT;  Surgeon: Angelia Mould, MD;  Location: [place];  Service: Vascular;  Laterality: Left;  . PERIPHERAL VASCULAR CATHETERIZATION N/A 08/29/2015   Procedure: Abdominal Aortogram;  Surgeon: Angelia Mould, MD;  Location: [place] CV LAB;  Service: Cardiovascular;  Laterality: N/A;  . POLYPECTOMY  06/25/2018   Procedure: POLYPECTOMY;  Surgeon: Rush Landmark Telford Nab., MD;  Location: [place];  Service: Gastroenterology;;  . Lia Foyer INJECTION  06/25/2018   Procedure: SUBMUCOSAL INJECTION;  Surgeon: Irving Copas., MD;  Location: [place];  Service: Gastroenterology;;  . TEE WITHOUT CARDIOVERSION N/A 02/14/2018   Procedure: TRANSESOPHAGEAL ECHOCARDIOGRAM (TEE);  Surgeon: Larey Dresser, MD;  Location: [facility] ENDOSCOPY;  Service: Cardiovascular;  Laterality: N/A;  . trabecular surgery     OS  . TRANSTHORACIC ECHOCARDIOGRAM  12-29-2012  DR [facility]   MILD LVH/  LVSF NORMAL/ EF 55-60%/   GRADE I DIASTOLIC DYSFUNCTION    Current Outpatient Medications  Medication Sig Dispense Refill  . acetaminophen (TYLENOL) 325 MG tablet Take 325 mg by mouth every 6 (six) hours as needed for moderate pain or headache.     [name] amLODipine (NORVASC) 5 MG tablet Take 5 mg by mouth every evening.    [name] apixaban (ELIQUIS) 5 MG TABS tablet Take 1 tablet (5 mg total) by mouth 2 (two) times daily. 60 tablet 6  . Ascorbic Acid (VITAMIN C PO) Take 1 tablet by mouth daily.    . bimatoprost (LUMIGAN) 0.01 % SOLN Place 1 drop at bedtime into both eyes.     [name] docusate sodium (COLACE) 100 MG capsule Take 100 mg by mouth at bedtime.     [name]  dorzolamide-timolol (COSOPT) 22.3-6.8 MG/ML ophthalmic solution Place 1 drop into both eyes 2 (two) times daily.     . ferrous sulfate 325 (65 FE) MG tablet Take 325 mg by mouth at bedtime.     [name] FLUoxetine (PROZAC) 40 MG capsule TAKE (1) CAPSULE DAILY. (Patient taking differently: Take 40 mg by mouth daily. ) 28 capsule 2  . furosemide (LASIX) 20 MG tablet TAKE 2 TABLETS EACH MORNING. 60 tablet 2  . Magnesium 500 MG CAPS Take 500 mg by mouth daily.     . Melatonin 5 MG TABS Take 5 mg by mouth at bedtime.    . methimazole (TAPAZOLE) 5 MG tablet Take 0.5 tablets (2.5 mg total) by mouth daily. (Patient taking differently: Take 5 mg by mouth every other day. ) 30 tablet 3  . metoprolol succinate (TOPROL-XL) 25 MG 24 hr tablet Take 1 tablet (25 mg total) by mouth daily. Take with or immediately following a meal. 90 tablet 3  . montelukast (SINGULAIR) 10 MG tablet TAKE ONE TABLET AT BEDTIME. 30 tablet 3  . Multiple Vitamins-Minerals (CENTRUM SILVER PO) Take 1 tablet by mouth daily.    . Multiple Vitamins-Minerals (ZINC PO) Take 1 tablet by mouth daily.    [name] omeprazole (PRILOSEC) 20 MG capsule Take 1 capsule (20 mg total) by mouth daily. (Patient taking differently: Take 20 mg by mouth at bedtime. ) 90 capsule 0  . Polyethyl Glycol-Propyl Glycol (SYSTANE) 0.4-0.3 % SOLN Place 1 drop  into both eyes daily as needed (for dry eyes).     . potassium chloride SA (K-DUR,KLOR-CON) 20 MEQ tablet Take 1 tablet (20 mEq total) by mouth daily. 30 tablet 11  . rosuvastatin (CRESTOR) 40 MG tablet TAKE 1 TABLET BY MOUTH DAILY. (Patient taking differently: Take 40 mg by mouth daily. ) 90 tablet 0  . senna (SENOKOT) 8.6 MG tablet Take 1 tablet by mouth 2 (two) times daily.    . SYMBICORT 160-4.5 MCG/ACT inhaler USE 2 PUFFS TWICE DAILY. (Patient taking differently: Inhale 2 puffs into the lungs 2 (two) times daily as needed (for shortness of breath or wheezing). ) 1 Inhaler 0   No current facility-administered medications for this encounter.     Allergies  Allergen Reactions  . Niacin-Lovastatin Er Shortness Of Breath and Other (See Comments)    Dyspnea, flushing  . Penicillins Hives, Shortness Of Breath and Other (See Comments)    Flushing  & dyspnea Because of a history of documented adverse serious drug reaction;Medi Alert bracelet  is recommended PCN reaction causing immediate rash, facial/tongue/throat swelling, SOB or lightheadedness with hypotension: Yes PCN reaction causing severe rash involving mucus membranes or skin necrosis: No PCN reaction occurring within the last 10 years: NO PCN reaction that required hospitalization: NO  . Sulfonamide Derivatives Hives, Shortness Of Breath and Other (See Comments)    Flushing & dyspnea Because of a history of documented adverse serious drug reaction;Medi Alert bracelet  is recommended  . Atorvastatin Other (See Comments)    Myalgias & athralgias- takes Crestor, DOES NOT LIKE LIPITOR    Social History   Socioeconomic History  . Marital status: Married    Spouse name: [name]   . Number of children: 3  . Years of education: 12+  . Highest education level: Not on file  Occupational History  . 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  Financial resource strain: Not on file  . Food insecurity    Worry: Not on file    Inability: Not on file  . Transportation needs    Medical: Not on file    Non-medical: Not on file  Tobacco Use  . Smoking status: Former Smoker    Packs/day: 0.50    Years: 50.00    Pack years: 25.00    Types: Cigarettes    Quit date: 07/28/2014    Years since quitting: 4.7  . Smokeless tobacco: Never Used  Substance and Sexual Activity  . Alcohol use: Yes    Alcohol/week: 0.0 standard drinks    Comment:  socially, variable  . Drug use: No  . Sexual activity: Not on file  Lifestyle  . Physical activity    Days per week: Not on file    Minutes per session: Not on file  . Stress: Not on file  Relationships  . Social Herbalist on phone: Not on file    Gets together: Not on file    Attends religious service: Not on file    Active member of club or organization: Not on file    Attends meetings of clubs or organizations: Not on file    Relationship status: Not on file  . Intimate partner violence    Fear of current or ex partner: Not on file    Emotionally abused: Not on file    Physically abused: Not on file    Forced sexual activity: Not on file  Other Topics Concern  . Not on file  Social History Narrative   Patient lives at home with spouse [name]   Patient has 3 children    Patient is right handed     Family History  Problem Relation Age of Onset  . Stroke Mother        mini strokes  . Alcohol abuse Father   . Heart disease Father        MI after 58  . Stroke Father   . Hypertension Father   . Heart attack Father   . Heart disease Paternal Aunt        several  . Hypertension Paternal Aunt        several  . Stroke Paternal Aunt        several  . Stroke Paternal Uncle        several  . Heart disease Paternal Uncle        several;2 had MI pre 29  . Cancer Daughter 83       breast ca, also with benign sessile polyp     ROS-  All systems are reviewed and negative except as per the HPI above  Physical Exam: Vitals:   05/07/19 1102  BP: (!) 144/64  Pulse: (!) 51  Weight: 90.3 kg  Height: 6\' 1"  (1.854 m)   Wt Readings from Last 3 Encounters:  05/07/19 90.3 kg  04/09/19 86.6 kg  03/05/19 88.1 kg    Labs: Lab Results  Component Value Date   NA 140 04/06/2019   K 4.4 04/06/2019   CL 103 04/06/2019   CO2 21 04/06/2019   GLUCOSE 149 (H) 04/06/2019   BUN 19 04/06/2019   CREATININE 1.26 04/06/2019   CALCIUM 9.6 04/06/2019   PHOS 3.3 12/16/2018   MG 2.0 12/16/2018   Lab Results  Component Value Date   INR 1.0 12/03/2018   Lab Results  Component Value Date   CHOL 105 10/25/2017  HDL 35 (L) 10/25/2017   LDLCALC 50 10/25/2017   TRIG 101 10/25/2017    GEN- The patient is well appearing elderly male, alert and oriented x 3 today.   HEENT-head normocephalic, atraumatic, sclera clear, conjunctiva pink, hearing intact, trachea midline. Lungs- Clear to ausculation bilaterally, normal work of breathing Heart- Regular rate and rhythm, no murmurs, rubs or gallops  GI- soft, NT, ND, + BS Extremities- no clubbing, cyanosis, or edema MS- no significant deformity or atrophy Skin- no rash or lesion Psych- euthymic mood, full affect Neuro- strength and sensation are intact   EKG- SB HR 51, PR 180, QRS 76, QTc 436  Epic records reviewed  Echo-01/2018-Study Conclusions  - Procedure narrative: Transthoracic echocardiography. Image   quality was suboptimal. The study was technically difficult, as a   result of poor acoustic windows and poor sound wave transmission.   Intravenous contrast (Definity) was administered. - Left ventricle: Wall thickness was increased in a pattern of   moderate LVH. Systolic function was normal. The estimated   ejection fraction was in the range of 55% to 60%. - Mitral valve: Calcified annulus. Mildly thickened leaflets . - Impressions: Overall poor image quality.   Impressions:  - Overall poor image quality. Left atrium normal size  Assessment and Plan: 1. Paroxysmal atrial fibrillation S/p afib ablation with Dr Rayann Heman 04/09/19. Patient appears to be maintaining Troy [name]. Continue toprol 25 mg daily Continue Eliquis 5 mg BID with no missed doses for 3 months post procedure.  This patients CHA2DS2-VASc Score and unadjusted Ischemic Stroke Rate (% per year) is equal to 9.7 % stroke rate/year from a score of 6  Above score calculated as 1 point each if present [CHF, HTN, DM, Vascular=MI/PAD/Aortic Plaque, Age if 65-74, or Male] Above score calculated as 2 points each if present [Age > 75, or Stroke/TIA/TE]  2. Chronic diastolic dysfunction Weight stable, no signs or symptoms of fluid overload. Continue present therapy.  3. PAD Stable, no change today.  4. Hyperthyroid Occurred on amiodarone. Would avoid amiodarone in the future.  5. HTN Stable, no change today.   Follow up with Dr Rayann Heman as scheduled. [facility].   [facility] [address] [place], [place] [address] [phone]

## 2019-05-12 ENCOUNTER — Ambulatory Visit (INDEPENDENT_AMBULATORY_CARE_PROVIDER_SITE_OTHER): Payer: PPO | Admitting: *Deleted

## 2019-05-12 DIAGNOSIS — I480 Paroxysmal atrial fibrillation: Secondary | ICD-10-CM | POA: Diagnosis not present

## 2019-05-12 LAB — CUP PACEART REMOTE DEVICE CHECK
Date Time Interrogation Session: 20200811173530
Implantable Pulse Generator Implant Date: 20200709

## 2019-05-19 ENCOUNTER — Encounter: Payer: Self-pay | Admitting: Internal Medicine

## 2019-05-19 DIAGNOSIS — H9193 Unspecified hearing loss, bilateral: Secondary | ICD-10-CM

## 2019-05-20 NOTE — Progress Notes (Signed)
Carelink Summary Report / Loop Recorder 

## 2019-05-25 ENCOUNTER — Other Ambulatory Visit: Payer: Self-pay | Admitting: Pulmonary Disease

## 2019-05-29 ENCOUNTER — Encounter: Payer: Self-pay | Admitting: Internal Medicine

## 2019-05-29 ENCOUNTER — Ambulatory Visit (INDEPENDENT_AMBULATORY_CARE_PROVIDER_SITE_OTHER): Payer: PPO | Admitting: Internal Medicine

## 2019-05-29 ENCOUNTER — Other Ambulatory Visit: Payer: Self-pay

## 2019-05-29 VITALS — BP 110/70 | HR 42 | Temp 98.7°F | Ht 72.75 in | Wt 197.8 lb

## 2019-05-29 DIAGNOSIS — E0580 Other thyrotoxicosis without thyrotoxic crisis or storm: Secondary | ICD-10-CM | POA: Diagnosis not present

## 2019-05-29 DIAGNOSIS — R001 Bradycardia, unspecified: Secondary | ICD-10-CM | POA: Diagnosis not present

## 2019-05-29 LAB — T3, FREE: T3, Free: 3 pg/mL (ref 2.3–4.2)

## 2019-05-29 LAB — T4, FREE: Free T4: 0.93 ng/dL (ref 0.60–1.60)

## 2019-05-29 LAB — TSH: TSH: 3.52 u[IU]/mL (ref 0.35–4.50)

## 2019-05-29 NOTE — Progress Notes (Signed)
Patient ID: Troy Owensby [name], male   DOB: [birth year], 78 y.o.   MRN: [id number]   HPI  Troy Seminara [name] is a 78 y.o.-year-old male, initially referred by his cardiologist, [name], presenting for follow-up for amiodarone induced thyrotoxicosis.  Last visit was 4 months ago (virtual)  He has a history of colon cancer and had a low anterior resection 11/2018.  He has no metastasis.  He does not need chemotherapy and radiation therapy.  He will have a new colonoscopy 07/2019. He will have a new colonoscopy 07/2019.  His oncologist is Dr. Learta Codding.  Reviewed and addended history: Patient is seen in the cardiology clinic for several cardiovascular conditions: paroxysmal Afib, Diastolic CHF, CAD.  He also has a history of AAA, PAD, history of TIA.  He was found to have a low TSH at his visit on 07/14/2018.  He had repeat TFTs 9 days later and this showed again a suppressed TSH and also elevated free T4 and free T3.    She was taking a high dose of biotin, 10,000 mcg daily and he took this before his TFTs in 10-08/2018. He had a CT of the chest with contrast on 06/25/2018.  At that time, his amiodarone (that he was taking for almost 1 year) was stopped.    He was started on methimazole 10 mg daily.  He also started Toprol-XL 25 mg daily.  We had to increase the dose of methimazole slightly to 15 mg daily >> but then decreased to 10 mg daily.  He continues on this dose today.  We also started Prednisone 10 mg daily >> we were then able to decrease the dose and finally stop 10/2018.  TFTs normalized in 11/2018.  However, afterwards TSH increased so we decreased her methimazole with the last dose change in 03/2021 2.5 mg daily.  Reviewed his TFTs: Lab Results  Component Value Date   TSH 5.73 (H) 03/02/2019   TSH 11.765 (H) 01/29/2019   TSH 3.57 11/27/2018   TSH 0.02 (L) 10/27/2018   TSH <0.010 (L) 10/14/2018   TSH <0.01 (L) 09/26/2018   TSH <0.01 (L) 08/27/2018   TSH 0.010 (L)  07/23/2018   TSH 0.03 (L) 07/14/2018   TSH 0.751 06/24/2018   FREET4 0.73 03/02/2019   FREET4 0.70 (L) 01/29/2019   FREET4 0.90 11/27/2018   FREET4 1.34 10/27/2018   FREET4 1.71 10/14/2018   FREET4 2.64 (H) 09/26/2018   FREET4 3.84 (H) 08/27/2018   FREET4 2.94 (H) 07/23/2018   FREET4 0.9 07/29/2006   T3FREE 2.6 03/02/2019   T3FREE 2.9 11/27/2018   T3FREE 3.0 10/27/2018   T3FREE 3.1 10/14/2018   T3FREE 3.9 09/26/2018   T3FREE 5.6 (H) 08/27/2018   T3FREE 4.7 (H) 07/23/2018   T3FREE 3.0 07/29/2006   His Graves' antibodies were not elevated: Lab Results  Component Value Date   TSI <89 08/27/2018   Pt denies: - feeling nodules in neck - hoarseness - dysphagia - choking - SOB with lying down  Pt does not have a FH of thyroid ds. No FH of thyroid cancer. No h/o radiation tx to head or neck.  No seaweed or kelp. No recent contrast studies. No herbal supplements. No Biotin use. No recent steroids use.   Pt. also has a history of lung cancer, HL, COPD, OSA-on CPAP.  ROS: Constitutional: no weight gain/no weight loss, no fatigue, no subjective hyperthermia, no subjective hypothermia Eyes: no blurry vision, no xerophthalmia ENT: no sore throat, + see HPI  Cardiovascular: no CP/no SOB/no palpitations/no leg swelling Respiratory: no cough/no SOB/no wheezing Gastrointestinal: no N/no V/no D/no C/no acid reflux Musculoskeletal: no muscle aches/no joint aches Skin: no rashes, no hair loss Neurological: no tremors/no numbness/no tingling/no dizziness  I reviewed pt's medications, allergies, PMH, social hx, family hx, and changes were documented in the history of present illness. Otherwise, unchanged from my initial visit note.  Past Medical History:  Diagnosis Date  . AAA (abdominal aortic aneurysm) ([place]) LAST ABDOMINAL US 10-20-17 3.3 CM   MONITORED BY DR [name]  . Anxiety   . Arthritis   . Atrial fibrillation ([place])   . Basal cell carcinoma   . Bilateral carotid artery  stenosis DUPLEX 12-29-2012  BY DR [facility]   BILATERAL ICA STENOSIS 60-79%  . BPH (benign prostatic hypertrophy)   . Chronic diastolic heart failure ([place])   . Chronic kidney disease    stage 3, pt unaware  . Colon cancer ([place])   . Complication of anesthesia    had nausea after carotid artery surgery, states the medicine given to help the nausea, made it worse  . COPD (chronic obstructive pulmonary disease) ([place])   . Depression   . GERD (gastroesophageal reflux disease)   . Glaucoma BOTH EYES   Dr Gershon Crane  . History of basal cell carcinoma excision   . History of lung cancer APRIL 2012  SQUAMOUS CELL---- S/P RIGHT LOWER LOBECTOMY AT [facility] --  NO CHEMORADIATION---  NO RECURRENCE    ONCOLOGIST- DR Tressie Stalker  [facility]2012  . History of pneumothorax    pt unaware  . Hx of adenomatous colonic polyps 2005    X 2; 1 hyperplastic polyp; Dr Olevia Perches  . Hyperlipidemia   . Hypertension   . Impaired fasting glucose 2007   108; A1c5.4%  . Lesion of bladder   . Lung cancer ([place]) 01/25/2012  . Microhematuria   . OSA (obstructive sleep apnea) 08/29/2015   CPAP SET ON 10  . PAD (peripheral artery disease) ([place]) ABI'S  JAN 2014  0.65 ON RIGHT ;  1.04 ON LEFT  . Peripheral vascular disease ([place]) S/P ANGIOPLASTY AND STENTING   FOLLOWED  BY DR [name]  . Pleural effusion on right 11/18/2018   Chronic, noted on CXR  . Spinal stenosis Sept. 2015  . Status post placement of implantable loop recorder   . Stroke [facility]) Jul 07, 2013   mini  TIA  . Thoracic aorta atherosclerosis ([place])   . Thyrotoxicosis    amiodarone induced  . Urgency of urination    Past Surgical History:  Procedure Laterality Date  . ANGIO PLASTY     X 4 in legs  . AORTOGRAM  07-27-2002   MILD DIFFUSE ILIAC ARTERY OCCLUSIVE DISEASE /  LEFT RENAL ARTERY 20%/ PATENT LEFT FEM-POP GRAFT/ MILD SFA AND POPLITEAL ARTERY OCCLUSIVE DISEASE W/ SEVERE KIDNEY OCCLUSIVE DISEASE  . ATRIAL FIBRILLATION ABLATION N/A 04/09/2019   Procedure:  ATRIAL FIBRILLATION ABLATION;  Surgeon: Thompson Grayer, MD;  Location: [place] [facility];  Service: Cardiovascular;  Laterality: N/A;  . BASAL CELL CARCINOMA EXCISION     MULTIPLE TIMES--  RIGHT FOREARM, CHEEKS, AND BACK  . BIOPSY  06/25/2018   Procedure: BIOPSY;  Surgeon: Rush Landmark Telford Nab., MD;  Location: [place];  Service: Gastroenterology;;  . CARDIOVASCULAR STRESS TEST  12-08-2012  DR MCLEAN   NORMAL LEXISCAN WITH NO EXERCISE NUCLEAR STUDY/ EF 66%/   NO ISCHEMIA/ NO SIGNIFICANT CHANGE FROM PRIOR STUDY  . CARDIOVERSION N/A 02/14/2018  Procedure: CARDIOVERSION;  Surgeon: Larey Dresser, MD;  Location: [facility] ENDOSCOPY;  Service: Cardiovascular;  Laterality: N/A;  . CAROTID ANGIOGRAM N/A 07/10/2013   Procedure: CAROTID ANGIOGRAM;  Surgeon: Elam Dutch, MD;  Location: [facility] CATH LAB;  Service: Cardiovascular;  Laterality: N/A;  . CAROTID ENDARTERECTOMY Right 07-14-13   cea  . CATARACT EXTRACTION W/ INTRAOCULAR LENS  IMPLANT, BILATERAL    . colon polyectomy    . COLONOSCOPY WITH PROPOFOL N/A 06/25/2018   Procedure: COLONOSCOPY WITH PROPOFOL;  Surgeon: Rush Landmark Telford Nab., MD;  Location: [place];  Service: Gastroenterology;  Laterality: N/A;  . CYSTOSCOPY W/ RETROGRADES Bilateral 01/21/2013   Procedure: CYSTOSCOPY WITH RETROGRADE PYELOGRAM;  Surgeon: Molli Hazard, MD;  Location: [facility];  Service: Urology;  Laterality: Bilateral;   CYSTO, BLADDER BIOPSY, BILATERAL RETROGRADE PYELOGRAM  RAD TECH FROM RADIOLOGY PER JOY  . CYSTOSCOPY WITH BIOPSY N/A 01/21/2013   Procedure: CYSTOSCOPY WITH BIOPSY;  Surgeon: Molli Hazard, MD;  Location: [facility];  Service: Urology;  Laterality: N/A;  . ENDARTERECTOMY Right 07/14/2013   Procedure: ENDARTERECTOMY CAROTID;  Surgeon: Angelia Mould, MD;  Location: [place];  Service: Vascular;  Laterality: Right;  . EP IMPLANTABLE DEVICE N/A 08/12/2015   Procedure: Loop Recorder Insertion;   Surgeon: Thompson Grayer, MD;  Location: [place] [facility];  Service: Cardiovascular;  Laterality: N/A;  . ESOPHAGOGASTRODUODENOSCOPY (EGD) WITH PROPOFOL N/A 06/25/2018   Procedure: ESOPHAGOGASTRODUODENOSCOPY (EGD) WITH PROPOFOL;  Surgeon: Rush Landmark Telford Nab., MD;  Location: [place];  Service: Gastroenterology;  Laterality: N/A;  . ESOPHAGOGASTRODUODENOSCOPY (EGD) WITH PROPOFOL N/A 07/10/2018   Procedure: ESOPHAGOGASTRODUODENOSCOPY (EGD) WITH PROPOFOL;  Surgeon: Milus Banister, MD;  Location: [facility] ENDOSCOPY;  Service: Endoscopy;  Laterality: N/A;  . EUS N/A 07/10/2018   Procedure: UPPER ENDOSCOPIC ULTRASOUND (EUS) RADIAL;  Surgeon: Milus Banister, MD;  Location: [facility] ENDOS[facility];  Service: Endoscopy;  Laterality: N/A;  . EYE SURGERY Right   . FEMORAL-POPLITEAL BYPASS GRAFT Left [facility]   AND 2001  IN [place]  . FEMORAL-POPLITEAL BYPASS GRAFT Left 08/30/2015   Procedure: REVISION OF BYPASS GRAFT Left  FEMORAL-POPLITEAL ARTERY;  Surgeon: Angelia Mould, MD;  Location: [place];  Service: Vascular;  Laterality: Left;  . FINE NEEDLE ASPIRATION N/A 07/10/2018   Procedure: FINE NEEDLE ASPIRATION (FNA) LINEAR;  Surgeon: Milus Banister, MD;  Location: [facility];  Service: Endoscopy;  Laterality: N/A;  . FLEXIBLE SIGMOIDOSCOPY N/A 12/12/2018   Procedure: FLEXIBLE SIGMOIDOSCOPY;  Surgeon: Ileana Roup, MD;  Location: [facility] ORS;  Service: General;  Laterality: N/A;  . LARYNGOSCOPY  06-27-2004   BX VOCAL CORD  (LEUKOPLAKIA)  PER PT NO ISSUES SINCE  . LEFT HEART CATH AND CORONARY ANGIOGRAPHY N/A 08/20/2017   Procedure: LEFT HEART CATH AND CORONARY ANGIOGRAPHY;  Surgeon: Larey Dresser, MD;  Location: [place] [facility];  Service: Cardiovascular;  Laterality: N/A;  . LOOP RECORDER INSERTION N/A 04/09/2019   Procedure: LOOP RECORDER INSERTION;  Surgeon: Thompson Grayer, MD;  Location: [place] [facility];  Service: Cardiovascular;  Laterality: N/A;  . LOOP RECORDER REMOVAL N/A 04/09/2019    Procedure: LOOP RECORDER REMOVAL;  Surgeon: Thompson Grayer, MD;  Location: [place] [facility];  Service: Cardiovascular;  Laterality: N/A;  . LOWER EXTREMITY ANGIOGRAM Bilateral 08/29/2015   Procedure: Lower Extremity Angiogram;  Surgeon: Angelia Mould, MD;  Location: [place] [facility];  Service: Cardiovascular;  Laterality: Bilateral;  . LUNG LOBECTOMY  01/24/2011    RIGHT  UPPER LOBE  (SQUAMOUS CELL CARCINOMA) Dr Dorthea [place] , [facility]. No chemotherapyor radiation  . PATCH ANGIOPLASTY Right 07/14/2013   Procedure: PATCH ANGIOPLASTY;  Surgeon: Angelia Mould, MD;  Location: [place];  Service: Vascular;  Laterality: Right;  . PATCH ANGIOPLASTY Left 08/30/2015   Procedure: VEIN PATCH ANGIOPLASTY OF PROXIMAL Left BYPASS GRAFT;  Surgeon: Angelia Mould, MD;  Location: [place];  Service: Vascular;  Laterality: Left;  . PERIPHERAL VASCULAR CATHETERIZATION N/A 08/29/2015   Procedure: Abdominal Aortogram;  Surgeon: Angelia Mould, MD;  Location: [place] [facility];  Service: Cardiovascular;  Laterality: N/A;  . POLYPECTOMY  06/25/2018   Procedure: POLYPECTOMY;  Surgeon: Rush Landmark Telford Nab., MD;  Location: [place];  Service: Gastroenterology;;  . Lia Foyer INJECTION  06/25/2018   Procedure: SUBMUCOSAL INJECTION;  Surgeon: Irving Copas., MD;  Location: [place];  Service: Gastroenterology;;  . TEE WITHOUT CARDIOVERSION N/A 02/14/2018   Procedure: TRANSESOPHAGEAL ECHOCARDIOGRAM (TEE);  Surgeon: Larey Dresser, MD;  Location: [facility] ENDOSCOPY;  Service: Cardiovascular;  Laterality: N/A;  . trabecular surgery     OS  . TRANSTHORACIC ECHOCARDIOGRAM  12-29-2012  DR [facility]   MILD LVH/  LVSF NORMAL/ EF 78-93%/  GRADE I DIASTOLIC DYSFUNCTION   Social History   Socioeconomic History  . Marital status: Married    Spouse name: Troy [name]   . Number of children: 3  . Years of education: 12+  . Highest education level: Not on file  Occupational History  . 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 Financial resource strain: Not on file  . Food insecurity    Worry: Not on file    Inability: Not on file  . Transportation needs    Medical: Not on file    Non-medical: Not on file  Tobacco Use  . Smoking status: Former Smoker    Packs/day: 0.50    Years: 50.00    Pack years: 25.00    Types: Cigarettes    Quit date: 07/28/2014    Years since quitting: 4.8  . Smokeless tobacco: Never Used  Substance and Sexual Activity  . Alcohol use: Yes    Alcohol/week: 0.0 standard drinks    Comment:  socially, variable  . Drug use: No  . Sexual activity: Not on file  Lifestyle  . Physical activity    Days per week: Not on file    Minutes per session: Not on file  . Stress: Not on file  Relationships  . Social Herbalist on phone: Not on file    Gets together: Not on file    Attends religious service: Not on file    Active member of club or organization: Not on file    Attends meetings of clubs or organizations: Not on file    Relationship status: Not on file  . Intimate partner violence    Fear of current or ex partner: Not on file    Emotionally abused: Not on file    Physically abused: Not on file    Forced sexual activity: Not on file  Other Topics Concern  . Not on file  Social History Narrative   Patient lives at home with spouse Troy [name]   Patient has 3 children    Patient is right handed    Current Outpatient Medications on File Prior to Visit  Medication Sig Dispense  Refill  . acetaminophen (TYLENOL) 325 MG tablet Take 325 mg by mouth every 6 (six) hours as needed for moderate pain or headache.     [name] amLODipine (NORVASC) 5 MG tablet Take 5 mg by mouth every evening.    [name] apixaban (ELIQUIS) 5 MG TABS tablet Take 1 tablet (5 mg total) by mouth 2 (two) times daily. 60 tablet 6  . Ascorbic Acid (VITAMIN C PO) Take  1 tablet by mouth daily.    . bimatoprost (LUMIGAN) 0.01 % SOLN Place 1 drop at bedtime into both eyes.     [name] docusate sodium (COLACE) 100 MG capsule Take 100 mg by mouth at bedtime.     . dorzolamide-timolol (COSOPT) 22.3-6.8 MG/ML ophthalmic solution Place 1 drop into both eyes 2 (two) times daily.     . ferrous sulfate 325 (65 FE) MG tablet Take 325 mg by mouth at bedtime.     [name] FLUoxetine (PROZAC) 40 MG capsule TAKE (1) CAPSULE DAILY. (Patient taking differently: Take 40 mg by mouth daily. ) 28 capsule 2  . furosemide (LASIX) 20 MG tablet TAKE 2 TABLETS EACH MORNING. 60 tablet 2  . Magnesium 500 MG CAPS Take 500 mg by mouth daily.     . Melatonin 5 MG TABS Take 5 mg by mouth at bedtime.    . methimazole (TAPAZOLE) 5 MG tablet Take 0.5 tablets (2.5 mg total) by mouth daily. (Patient taking differently: Take 5 mg by mouth every other day. ) 30 tablet 3  . metoprolol succinate (TOPROL-XL) 25 MG 24 hr tablet Take 1 tablet (25 mg total) by mouth daily. Take with or immediately following a meal. 90 tablet 3  . montelukast (SINGULAIR) 10 MG tablet TAKE ONE TABLET AT BEDTIME. 30 tablet 3  . Multiple Vitamins-Minerals (CENTRUM SILVER PO) Take 1 tablet by mouth daily.    . Multiple Vitamins-Minerals (ZINC PO) Take 1 tablet by mouth daily.    [name] omeprazole (PRILOSEC) 20 MG capsule Take 1 capsule (20 mg total) by mouth daily. (Patient taking differently: Take 20 mg by mouth at bedtime. ) 90 capsule 0  . Polyethyl Glycol-Propyl Glycol (SYSTANE) 0.4-0.3 % SOLN Place 1 drop into both eyes daily as needed (for dry eyes).     . potassium chloride SA (K-DUR,KLOR-CON) 20 MEQ tablet Take 1 tablet (20 mEq total) by mouth daily. 30 tablet 11  . rosuvastatin (CRESTOR) 40 MG tablet TAKE 1 TABLET BY MOUTH DAILY. (Patient taking differently: Take 40 mg by mouth daily. ) 90 tablet 0  . senna (SENOKOT) 8.6 MG tablet Take 1 tablet by mouth 2 (two) times daily.    . SYMBICORT 160-4.5 MCG/ACT inhaler USE 2 PUFFS TWICE DAILY.  10.2 g 0   No current facility-administered medications on file prior to visit.    Allergies  Allergen Reactions  . Niacin-Lovastatin Er Shortness Of Breath and Other (See Comments)    Dyspnea, flushing  . Penicillins Hives, Shortness Of Breath and Other (See Comments)    Flushing  & dyspnea Because of a history of documented adverse serious drug reaction;Medi Alert bracelet  is recommended PCN reaction causing immediate rash, facial/tongue/throat swelling, SOB or lightheadedness with hypotension: Yes PCN reaction causing severe rash involving mucus membranes or skin necrosis: No PCN reaction occurring within the last 10 years: NO PCN reaction that required hospitalization: NO  . Sulfonamide Derivatives Hives, Shortness Of Breath and Other (See Comments)    Flushing & dyspnea Because of a history of documented adverse serious drug reaction;Medi  Alert bracelet  is recommended  . Atorvastatin Other (See Comments)    Myalgias & athralgias- takes Crestor, DOES NOT LIKE LIPITOR   Family History  Problem Relation Age of Onset  . Stroke Mother        mini strokes  . Alcohol abuse Father   . Heart disease Father        MI after 60  . Stroke Father   . Hypertension Father   . Heart attack Father   . Heart disease Paternal Aunt        several  . Hypertension Paternal Aunt        several  . Stroke Paternal Aunt        several  . Stroke Paternal Uncle        several  . Heart disease Paternal Uncle        several;2 had MI pre 78  . Cancer Daughter 52       breast ca, also with benign sessile polyp     PE: BP 110/70   Pulse (!) 42   Temp 98.7 F (37.1 C)   Ht 6' 0.75" (1.848 m)   Wt 197 lb 12.8 oz (89.7 kg)   SpO2 97%   BMI 26.28 kg/m  Wt Readings from Last 3 Encounters:  05/29/19 197 lb 12.8 oz (89.7 kg)  05/07/19 199 lb (90.3 kg)  04/09/19 191 lb (86.6 kg)   Constitutional: overweight, in NAD Eyes: PERRLA, EOMI, no exophthalmos ENT: moist mucous membranes, no  thyromegaly, no cervical lymphadenopathy Cardiovascular: RRR, No MRG Respiratory: CTA B Gastrointestinal: abdomen soft, NT, ND, BS+ Musculoskeletal: no deformities, strength intact in all 4 Skin: moist, warm, no rashes Neurological: no tremor with outstretched hands, DTR normal in all 4  ASSESSMENT: 1.  Amiodarone-induced thyrotoxicosis (AIT)  2. Bradycardia  PLAN:  1. Patient with h/o AIT, without thyrotoxic symptoms other than possible weight loss.  However, he did mention that this was intentional.  He denied heat intolerance, palpitations, anxiety, hyper defecation.  He does have a history of paroxysmal atrial fibrillation and is on amiodarone.  Around the time of his initial investigation for thyrotoxicosis he was also taking a high dose of biotin which can cause thyrotoxicosis like picture, however, the thyroid test abnormality persisted even after stopping the supplement. -We initially started methimazole and then also added prednisone 10 mg daily.  He did well afterwards and we were able to decrease the dose of prednisone to methimazole.  We stopped prednisone in 10/2018.  At last visit, we were able to decrease the dose of methimazole from 5 mg to 2.5 mg daily.  He continues on Toprol-XL 25 mg daily for his A. Fib. -At this visit, he appears euthyroid and does not have any thyrotoxic symptoms.  He did not feel a difference after he decrease the methimazole dose. -We will check TFTs today and I am hoping that we can stop his methimazole afterwards. -I will see him back in 6 months, but with labs before next visit.  2. Bradycardia - HR today 42 on Toprol XL 25 mg daily per cardiology - he also feels sluggish, tired - will FWD to [name] -? If needs reduction in dose  Component     Latest Ref Rng & Units 05/29/2019  TSH     0.35 - 4.50 uIU/mL 3.52  Triiodothyronine,Free,Serum     2.3 - 4.2 pg/mL 3.0  T4,Free(Direct)     0.60 - 1.60 ng/dL 0.93  Thyroid tests are  normal.  At  this point, we will try to stop methimazole and have him back for labs in 1.5 months.  Philemon Kingdom, MD PhD [facility]

## 2019-05-29 NOTE — Patient Instructions (Addendum)
Please continue methimazole 2.5 mg daily.  Please stop at the lab.  Please come back for a follow-up appointment in 6 months.

## 2019-05-30 NOTE — Progress Notes (Signed)
Please have him decrease Toprol XL to 12.5 mg daily and take it at night.

## 2019-06-01 NOTE — Progress Notes (Signed)
Left message to return call 

## 2019-06-02 ENCOUNTER — Other Ambulatory Visit (HOSPITAL_COMMUNITY): Payer: Self-pay

## 2019-06-02 MED ORDER — METOPROLOL SUCCINATE ER 25 MG PO TB24: 12.5000 mg | ORAL_TABLET | Freq: Every day | ORAL | 1 refills | Status: DC

## 2019-06-02 NOTE — Progress Notes (Signed)
pts wife advised and verbalized understanding. She will have patient take 12.5mg  of metoprolol at night. New rx was sent into the pharmacy as well.

## 2019-06-04 DIAGNOSIS — H903 Sensorineural hearing loss, bilateral: Secondary | ICD-10-CM | POA: Diagnosis not present

## 2019-06-09 ENCOUNTER — Other Ambulatory Visit: Payer: Self-pay | Admitting: Internal Medicine

## 2019-06-10 ENCOUNTER — Other Ambulatory Visit: Payer: Self-pay | Admitting: Internal Medicine

## 2019-06-15 ENCOUNTER — Ambulatory Visit (INDEPENDENT_AMBULATORY_CARE_PROVIDER_SITE_OTHER): Payer: PPO | Admitting: *Deleted

## 2019-06-15 ENCOUNTER — Telehealth: Payer: Self-pay | Admitting: Gastroenterology

## 2019-06-15 DIAGNOSIS — I480 Paroxysmal atrial fibrillation: Secondary | ICD-10-CM | POA: Diagnosis not present

## 2019-06-15 DIAGNOSIS — H903 Sensorineural hearing loss, bilateral: Secondary | ICD-10-CM | POA: Diagnosis not present

## 2019-06-15 LAB — CUP PACEART REMOTE DEVICE CHECK
Date Time Interrogation Session: 20200913174255
Implantable Pulse Generator Implant Date: 20200709

## 2019-06-15 NOTE — Telephone Encounter (Signed)
Dr Rush Landmark please advise

## 2019-06-15 NOTE — Telephone Encounter (Signed)
Pt's wife called to inform that pt had surgery for colon cancer this past March. Dr. Rush Landmark advise pt to have a repeat colon after his surgery so pt wants to know if it is time to repeat colon. Pls call her.

## 2019-06-15 NOTE — Telephone Encounter (Signed)
Gerald Stabs and [place], I hope you are both well. Gerald Stabs, looks like you are able to get this patient's colon resection done in March. Should we wait until March of next year to do his one-year follow-up colonoscopy or do you want me to get that done for any reason before the end of the year? Whenever you decide let me know and Advanced RN Gerarda Fraction will help schedule follow-up Thanks. Chester Holstein

## 2019-06-16 NOTE — Telephone Encounter (Signed)
Thanks to all. Leroy Sea, reading your last note it looks like you were planning on imaging in December. If that is the case, then we can plan to proceed with a colonoscopy follow-up a week or so after you have seen him or had imaging done so we can ensure that there is still a role for [name] to move forward with things. If that is okay with you I will then please let me know and then Chong Sicilian can work on arranging that for December or January. All the best. GM

## 2019-06-16 NOTE — Telephone Encounter (Signed)
Sounds good

## 2019-06-16 NOTE — Telephone Encounter (Signed)
I am fine with colonoscopy at time of all his other surveillance imaging so as to consolidate his surveillance items - some time between now and March should be ok unless Dr. Benay Spice feels differently.  Gerald Stabs

## 2019-06-18 ENCOUNTER — Telehealth (HOSPITAL_COMMUNITY): Payer: Self-pay

## 2019-06-18 MED ORDER — METOPROLOL SUCCINATE ER 25 MG PO TB24: 12.5000 mg | ORAL_TABLET | Freq: Every day | ORAL | 1 refills | Status: DC

## 2019-06-18 NOTE — Telephone Encounter (Signed)
rx refill

## 2019-06-26 NOTE — Progress Notes (Signed)
Carelink Summary Report / Loop Recorder 

## 2019-07-02 DIAGNOSIS — M5384 Other specified dorsopathies, thoracic region: Secondary | ICD-10-CM | POA: Diagnosis not present

## 2019-07-02 DIAGNOSIS — M9902 Segmental and somatic dysfunction of thoracic region: Secondary | ICD-10-CM | POA: Diagnosis not present

## 2019-07-02 DIAGNOSIS — M9905 Segmental and somatic dysfunction of pelvic region: Secondary | ICD-10-CM | POA: Diagnosis not present

## 2019-07-02 DIAGNOSIS — M25552 Pain in left hip: Secondary | ICD-10-CM | POA: Diagnosis not present

## 2019-07-06 ENCOUNTER — Other Ambulatory Visit (HOSPITAL_COMMUNITY): Payer: Self-pay | Admitting: Cardiology

## 2019-07-06 ENCOUNTER — Telehealth: Payer: Self-pay | Admitting: Internal Medicine

## 2019-07-06 ENCOUNTER — Telehealth: Payer: Self-pay

## 2019-07-06 DIAGNOSIS — M9905 Segmental and somatic dysfunction of pelvic region: Secondary | ICD-10-CM | POA: Diagnosis not present

## 2019-07-06 DIAGNOSIS — M25552 Pain in left hip: Secondary | ICD-10-CM | POA: Diagnosis not present

## 2019-07-06 DIAGNOSIS — M9902 Segmental and somatic dysfunction of thoracic region: Secondary | ICD-10-CM | POA: Diagnosis not present

## 2019-07-06 DIAGNOSIS — M5384 Other specified dorsopathies, thoracic region: Secondary | ICD-10-CM | POA: Diagnosis not present

## 2019-07-06 NOTE — Telephone Encounter (Signed)
Left a message regarding appt on 07/10/19.

## 2019-07-07 DIAGNOSIS — M9902 Segmental and somatic dysfunction of thoracic region: Secondary | ICD-10-CM | POA: Diagnosis not present

## 2019-07-07 DIAGNOSIS — M5384 Other specified dorsopathies, thoracic region: Secondary | ICD-10-CM | POA: Diagnosis not present

## 2019-07-07 DIAGNOSIS — M25552 Pain in left hip: Secondary | ICD-10-CM | POA: Diagnosis not present

## 2019-07-07 DIAGNOSIS — M9905 Segmental and somatic dysfunction of pelvic region: Secondary | ICD-10-CM | POA: Diagnosis not present

## 2019-07-07 MED ORDER — OMEPRAZOLE 20 MG PO CPDR: 20.0000 mg | DELAYED_RELEASE_CAPSULE | Freq: Every day | ORAL | 0 refills | Status: DC

## 2019-07-07 MED ORDER — FLUOXETINE HCL 40 MG PO CAPS: 40.0000 mg | ORAL_CAPSULE | Freq: Every day | ORAL | 0 refills | Status: DC

## 2019-07-07 NOTE — Addendum Note (Signed)
Addended by: Delice Bison E on: 07/07/2019 09:24 AM   Modules accepted: Orders

## 2019-07-07 NOTE — Telephone Encounter (Signed)
RX sent

## 2019-07-07 NOTE — Telephone Encounter (Signed)
Pt wife called and stated that they made an appointment on 07/09/19 and would like to know if they can get this refilled now. Pill pack will be sending medications out on 07/08/19. Please advise.

## 2019-07-08 ENCOUNTER — Other Ambulatory Visit: Payer: Self-pay | Admitting: Internal Medicine

## 2019-07-08 NOTE — Patient Instructions (Addendum)
  Your a1c was checked today.   Medications reviewed and updated.  Changes include :   none  Your prescription(s) have been submitted to your pharmacy. Please take as directed and contact our office if you believe you are having problem(s) with the medication(s).   Please followup in 1 year

## 2019-07-08 NOTE — Progress Notes (Signed)
Subjective:    Patient ID: Troy Kindred [name], male    DOB: October 12, 1940, 78 y.o.   MRN: [id number]  HPI The patient is here for follow up.  He is exercising regularly - Has a trainer 3/week..     Afib Hypertension: He is taking his medication daily. He is compliant with a low sodium diet.  He denies chest pain, palpitations, edema and regular headaches.  He does not monitor his blood pressure at home.    Prediabetes:  He is compliant with a low sugar/carbohydrate diet.  He is exercising regularly.  Hyperlipidemia: He is taking his medication daily. He is compliant with a low fat/cholesterol diet. He denies myalgias.   COPD:  He has SOB with exertion and occasional wheeze.  There has been change in his symptoms.  He uses the Symbicort 1-2 times a day.  He is exercising.   Depression: He is taking his medication daily as prescribed. He denies any side effects from the medication. He feels his depression is well controlled and he is happy with his current dose of medication.   Back pain:  He is going to the chiropractor.    Medications and allergies reviewed with patient and updated if appropriate.  Patient Active Problem List   Diagnosis Date Noted  . Cancer of sigmoid colon ([place]) 12/12/2018  . Pleural effusion on right 07/15/2018  . Disease of lymph node   . Colon adenocarcinoma ([place]) 06/30/2018  . Acute GI bleeding 06/23/2018  . Acute blood loss anemia 06/23/2018  . Weakness generalized 06/23/2018  . Depression 05/06/2018  . Syncope 01/29/2018  . Chronic diastolic CHF (congestive heart failure) ([place]) 05/04/2017  . CAD (coronary artery disease) 05/03/2017  . MGUS (monoclonal gammopathy of unknown significance) 03/07/2016  . Benign essential HTN 01/19/2016  . OSA (obstructive sleep apnea) 08/29/2015  . PAF (paroxysmal atrial fibrillation) ([place])   . Glaucoma 06/22/2015  . DOE (dyspnea on exertion) 02/04/2015  . Memory loss, short term 08/03/2014  . Anemia, unspecified  12/28/2013  . Occlusion and stenosis of carotid artery without mention of cerebral infarction 07/09/2013  . COPD GOLD II  07/09/2013  . Chronic renal disease, stage 3, moderately decreased glomerular filtration rate between 30-59 mL/min/1.73 square meter (HCC) 07/06/2013  . TIA (transient ischemic attack) 07/06/2013  . Right carotid bruit 12/01/2012  . Basal cell carcinoma of skin 10/10/2012  . History of lung cancer 03/11/2012  . PAD (peripheral artery disease) ([place]) 12/16/2011  . AAA (abdominal aortic aneurysm) ([place]) 09/27/2011  . Squamous cell lung cancer ([place]) 11/08/2010  . GAIT IMBALANCE 03/02/2009  . Prediabetes 03/02/2009  . COLONIC POLYPS, HX OF 03/02/2009  . OBSTRUCTIVE CHRONIC BRONCHITIS WITHOUT EXACERBAT 01/15/2008  . HYPERPLASIA PROSTATE UNS W/O UR OBST & OTH LUTS 01/15/2008  . HYPERLIPIDEMIA 09/29/2007  . Peripheral vascular disease ([place]) 09/29/2007  . LEUKOPLAKIA, VOCAL CORDS 09/29/2007  . FEMORAL BRUIT 09/29/2007    Current Outpatient Medications on File Prior to Visit  Medication Sig Dispense Refill  . acetaminophen (TYLENOL) 325 MG tablet Take 325 mg by mouth every 6 (six) hours as needed for moderate pain or headache.     [name] amLODipine (NORVASC) 5 MG tablet Take 5 mg by mouth every evening.    [name] apixaban (ELIQUIS) 5 MG TABS tablet Take 1 tablet (5 mg total) by mouth 2 (two) times daily. 60 tablet 6  . Ascorbic Acid (VITAMIN C PO) Take 1 tablet by mouth daily.    . bimatoprost (LUMIGAN) 0.01 %  SOLN Place 1 drop at bedtime into both eyes.     [name] docusate sodium (COLACE) 100 MG capsule Take 100 mg by mouth at bedtime.     . dorzolamide-timolol (COSOPT) 22.3-6.8 MG/ML ophthalmic solution Place 1 drop into both eyes 2 (two) times daily.     . ferrous sulfate 325 (65 FE) MG tablet Take 325 mg by mouth at bedtime.     [name] FLUoxetine (PROZAC) 40 MG capsule Take 1 capsule (40 mg total) by mouth daily. Need office visit for more refills. 30 capsule 0  . furosemide (LASIX) 20 MG  tablet TAKE 2 TABLETS EACH MORNING. 56 tablet 0  . Magnesium 500 MG CAPS Take 500 mg by mouth daily.     . Melatonin 5 MG TABS Take 5 mg by mouth at bedtime.    . metoprolol succinate (TOPROL-XL) 25 MG 24 hr tablet Take 0.5 tablets (12.5 mg total) by mouth at bedtime. Take with or immediately following a meal. 45 tablet 1  . montelukast (SINGULAIR) 10 MG tablet TAKE ONE TABLET AT BEDTIME. 30 tablet 3  . Multiple Vitamins-Minerals (CENTRUM SILVER PO) Take 1 tablet by mouth daily.    . Multiple Vitamins-Minerals (ZINC PO) Take 1 tablet by mouth daily.    [name] omeprazole (PRILOSEC) 20 MG capsule Take 1 capsule (20 mg total) by mouth at bedtime. Need office visit for more refills 30 capsule 0  . Polyethyl Glycol-Propyl Glycol (SYSTANE) 0.4-0.3 % SOLN Place 1 drop into both eyes daily as needed (for dry eyes).     . potassium chloride SA (K-DUR,KLOR-CON) 20 MEQ tablet Take 1 tablet (20 mEq total) by mouth daily. 30 tablet 11  . rosuvastatin (CRESTOR) 40 MG tablet TAKE 1 TABLET BY MOUTH DAILY. (Patient taking differently: Take 40 mg by mouth daily. ) 90 tablet 0  . senna (SENOKOT) 8.6 MG tablet Take 1 tablet by mouth 2 (two) times daily.    . SYMBICORT 160-4.5 MCG/ACT inhaler USE 2 PUFFS TWICE DAILY. 10.2 g 0   No current facility-administered medications on file prior to visit.     Past Medical History:  Diagnosis Date  . AAA (abdominal aortic aneurysm) ([place]) LAST ABDOMINAL US 10-20-17 3.3 CM   MONITORED BY DR [name]  . Anxiety   . Arthritis   . Atrial fibrillation ([place])   . Basal cell carcinoma   . Bilateral carotid artery stenosis DUPLEX 12-29-2012  BY DR [facility]   BILATERAL ICA STENOSIS 60-79%  . BPH (benign prostatic hypertrophy)   . Chronic diastolic heart failure ([place])   . Chronic kidney disease    stage 3, pt unaware  . Colon cancer ([place])   . Complication of anesthesia    had nausea after carotid artery surgery, states the medicine given to help the nausea, made it worse  . COPD  (chronic obstructive pulmonary disease) ([place])   . Depression   . GERD (gastroesophageal reflux disease)   . Glaucoma BOTH EYES   Dr Gershon Crane  . History of basal cell carcinoma excision   . History of lung cancer APRIL 2012  SQUAMOUS CELL---- S/P RIGHT LOWER LOBECTOMY AT [facility] --  NO CHEMORADIATION---  NO RECURRENCE    ONCOLOGIST- DR Tressie Stalker  LOV IN [facility] 10-27-2012  . History of pneumothorax    pt unaware  . Hx of adenomatous colonic polyps 2005    X 2; 1 hyperplastic polyp; Dr Olevia Perches  . Hyperlipidemia   . Hypertension   . Impaired fasting glucose 2007   108; A1c5.4%  .  Lesion of bladder   . Lung cancer ([place]) 01/25/2012  . Microhematuria   . OSA (obstructive sleep apnea) 08/29/2015   CPAP SET ON 10  . PAD (peripheral artery disease) ([place]) ABI'S  JAN 2014  0.65 ON RIGHT ;  1.04 ON LEFT  . Peripheral vascular disease ([place]) S/P ANGIOPLASTY AND STENTING   FOLLOWED  BY DR [name]  . Pleural effusion on right 11/18/2018   Chronic, noted on CXR  . Spinal stenosis Sept. 2015  . Status post placement of implantable loop recorder   . Stroke [facility]) Jul 07, 2013   mini  TIA  . Thoracic aorta atherosclerosis ([place])   . Thyrotoxicosis    amiodarone induced  . Urgency of urination     Past Surgical History:  Procedure Laterality Date  . ANGIO PLASTY     X 4 in legs  . AORTOGRAM  07-27-2002   MILD DIFFUSE ILIAC ARTERY OCCLUSIVE DISEASE /  LEFT RENAL ARTERY 20%/ PATENT LEFT FEM-POP GRAFT/ MILD SFA AND POPLITEAL ARTERY OCCLUSIVE DISEASE W/ SEVERE KIDNEY OCCLUSIVE DISEASE  . ATRIAL FIBRILLATION ABLATION N/A 04/09/2019   Procedure: ATRIAL FIBRILLATION ABLATION;  Surgeon: Thompson Grayer, MD;  Location: [place] CV LAB;  Service: Cardiovascular;  Laterality: N/A;  . BASAL CELL CARCINOMA EXCISION     MULTIPLE TIMES--  RIGHT FOREARM, CHEEKS, AND BACK  . BIOPSY  06/25/2018   Procedure: BIOPSY;  Surgeon: Rush Landmark Telford Nab., MD;  Location: [place];  Service: Gastroenterology;;  .  CARDIOVASCULAR STRESS TEST  12-08-2012  DR MCLEAN   NORMAL LEXISCAN WITH NO EXERCISE NUCLEAR STUDY/ EF 66%/   NO ISCHEMIA/ NO SIGNIFICANT CHANGE FROM PRIOR STUDY  . CARDIOVERSION N/A 02/14/2018   Procedure: CARDIOVERSION;  Surgeon: Larey Dresser, MD;  Location: [facility] ENDOSCOPY;  Service: Cardiovascular;  Laterality: N/A;  . CAROTID ANGIOGRAM N/A 07/10/2013   Procedure: CAROTID ANGIOGRAM;  Surgeon: Elam Dutch, MD;  Location: [facility] CATH LAB;  Service: Cardiovascular;  Laterality: N/A;  . CAROTID ENDARTERECTOMY Right 07-14-13   cea  . CATARACT EXTRACTION W/ INTRAOCULAR LENS  IMPLANT, BILATERAL    . colon polyectomy    . COLONOSCOPY WITH PROPOFOL N/A 06/25/2018   Procedure: COLONOSCOPY WITH PROPOFOL;  Surgeon: Rush Landmark Telford Nab., MD;  Location: [place];  Service: Gastroenterology;  Laterality: N/A;  . CYSTOSCOPY W/ RETROGRADES Bilateral 01/21/2013   Procedure: CYSTOSCOPY WITH RETROGRADE PYELOGRAM;  Surgeon: Molli Hazard, MD;  Location: [facility];  Service: Urology;  Laterality: Bilateral;   CYSTO, BLADDER BIOPSY, BILATERAL RETROGRADE PYELOGRAM  RAD TECH FROM RADIOLOGY PER JOY  . CYSTOSCOPY WITH BIOPSY N/A 01/21/2013   Procedure: CYSTOSCOPY WITH BIOPSY;  Surgeon: Molli Hazard, MD;  [facility];  Service: Urology;  Laterality: N/A;  . ENDARTERECTOMY Right 07/14/2013   Procedure: ENDARTERECTOMY CAROTID;  Surgeon: Angelia Mould, MD;  Location: [place];  Service: Vascular;  Laterality: Right;  . EP IMPLANTABLE DEVICE N/A 08/12/2015   Procedure: Loop Recorder Insertion;  Surgeon: Thompson Grayer, MD;  Location: [place] CV LAB;  Service: Cardiovascular;  Laterality: N/A;  . ESOPHAGOGASTRODUODENOSCOPY (EGD) WITH PROPOFOL N/A 06/25/2018   Procedure: ESOPHAGOGASTRODUODENOSCOPY (EGD) WITH PROPOFOL;  Surgeon: Rush Landmark Telford Nab., MD;  Location: [place];  Service: Gastroenterology;  Laterality: N/A;  .  ESOPHAGOGASTRODUODENOSCOPY (EGD) WITH PROPOFOL N/A 07/10/2018   Procedure: ESOPHAGOGASTRODUODENOSCOPY (EGD) WITH PROPOFOL;  Surgeon: Milus Banister, MD;  Location: [facility] ENDOSCOPY;  Service: Endoscopy;  Laterality: N/A;  . EUS N/A 07/10/2018   Procedure: UPPER  ENDOSCOPIC ULTRASOUND (EUS) RADIAL;  Surgeon: Milus Banister, MD;  Location: [facility] ENDOSCOPY;  Service: Endoscopy;  Laterality: N/A;  . EYE SURGERY Right   . FEMORAL-POPLITEAL BYPASS GRAFT Left [facility]   AND 2001  IN [place]  . FEMORAL-POPLITEAL BYPASS GRAFT Left 08/30/2015   Procedure: REVISION OF BYPASS GRAFT Left  FEMORAL-POPLITEAL ARTERY;  Surgeon: Angelia Mould, MD;  Location: [place];  Service: Vascular;  Laterality: Left;  . FINE NEEDLE ASPIRATION N/A 07/10/2018   Procedure: FINE NEEDLE ASPIRATION (FNA) LINEAR;  Surgeon: Milus Banister, MD;  Location: [facility] ENDOSCOPY;  Service: Endoscopy;  Laterality: N/A;  . FLEXIBLE SIGMOIDOSCOPY N/A 12/12/2018   Procedure: FLEXIBLE SIGMOIDOSCOPY;  Surgeon: Ileana Roup, MD;  Location: [facility] ORS;  Service: General;  Laterality: N/A;  . LARYNGOSCOPY  06-27-2004   BX VOCAL CORD  (LEUKOPLAKIA)  PER PT NO ISSUES SINCE  . LEFT HEART CATH AND CORONARY ANGIOGRAPHY N/A 08/20/2017   Procedure: LEFT HEART CATH AND CORONARY ANGIOGRAPHY;  Surgeon: Larey Dresser, MD;  Location: [place] CV LAB;  Service: Cardiovascular;  Laterality: N/A;  . LOOP RECORDER INSERTION N/A 04/09/2019   Procedure: LOOP RECORDER INSERTION;  Surgeon: Thompson Grayer, MD;  Location: [place] CV LAB;  Service: Cardiovascular;  Laterality: N/A;  . LOOP RECORDER REMOVAL N/A 04/09/2019   Procedure: LOOP RECORDER REMOVAL;  Surgeon: Thompson Grayer, MD;  Location: [place] CV LAB;  Service: Cardiovascular;  Laterality: N/A;  . LOWER EXTREMITY ANGIOGRAM Bilateral 08/29/2015   Procedure: Lower Extremity Angiogram;  Surgeon: Angelia Mould, MD;  Location: [place] CV LAB;  Service: Cardiovascular;  Laterality:  Bilateral;  . LUNG LOBECTOMY  01/24/2011    RIGHT UPPER LOBE  (SQUAMOUS CELL CARCINOMA) Dr Dorthea Cove , [facility]. No chemotherapyor radiation  . PATCH ANGIOPLASTY Right 07/14/2013   Procedure: PATCH ANGIOPLASTY;  Surgeon: Angelia Mould, MD;  Location: [place];  Service: Vascular;  Laterality: Right;  . PATCH ANGIOPLASTY Left 08/30/2015   Procedure: VEIN PATCH ANGIOPLASTY OF PROXIMAL Left BYPASS GRAFT;  Surgeon: Angelia Mould, MD;  Location: [place];  Service: Vascular;  Laterality: Left;  . PERIPHERAL VASCULAR CATHETERIZATION N/A 08/29/2015   Procedure: Abdominal Aortogram;  Surgeon: Angelia Mould, MD;  Location: [place] [facility] LAB;  Service: Cardiovascular;  Laterality: N/A;  . POLYPECTOMY  06/25/2018   Procedure: POLYPECTOMY;  Surgeon: Rush Landmark Telford Nab., MD;  Location: [place];  Service: Gastroenterology;;  . Lia Foyer INJECTION  06/25/2018   Procedure: SUBMUCOSAL INJECTION;  Surgeon: Irving Copas., MD;  Location: [place];  Service: Gastroenterology;;  . TEE WITHOUT CARDIOVERSION N/A 02/14/2018   Procedure: TRANSESOPHAGEAL ECHOCARDIOGRAM (TEE);  Surgeon: Larey Dresser, MD;  Location: [facility] ENDOSCOPY;  Service: Cardiovascular;  Laterality: N/A;  . trabecular surgery     OS  . TRANSTHORACIC ECHOCARDIOGRAM  12-29-2012  DR [facility]   MILD LVH/  LVSF NORMAL/ EF 75-10%/  GRADE I DIASTOLIC DYSFUNCTION    Social History   Socioeconomic History  . Marital status: Married    Spouse name: [name]   . Number of children: 3  . Years of education: 12+  . Highest education level: Not on file  Occupational History  . 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 Financial resource strain: Not on file  . Food insecurity    Worry: Not on file    Inability:  Not on file  . Transportation needs    Medical: Not on file    Non-medical: Not on  file  Tobacco Use  . Smoking status: Former Smoker    Packs/day: 0.50    Years: 50.00    Pack years: 25.00    Types: Cigarettes    Quit date: 07/28/2014    Years since quitting: 4.9  . Smokeless tobacco: Never Used  Substance and Sexual Activity  . Alcohol use: Yes    Alcohol/week: 0.0 standard drinks    Comment:  socially, variable  . Drug use: No  . Sexual activity: Not on file  Lifestyle  . Physical activity    Days per week: Not on file    Minutes per session: Not on file  . Stress: Not on file  Relationships  . Social Herbalist on phone: Not on file    Gets together: Not on file    Attends religious service: Not on file    Active member of club or organization: Not on file    Attends meetings of clubs or organizations: Not on file    Relationship status: Not on file  Other Topics Concern  . Not on file  Social History Narrative   Patient lives at home with spouse [name]   Patient has 3 children    Patient is right handed     Family History  Problem Relation Age of Onset  . Stroke Mother        mini strokes  . Alcohol abuse Father   . Heart disease Father        MI after 40  . Stroke Father   . Hypertension Father   . Heart attack Father   . Heart disease Paternal Aunt        several  . Hypertension Paternal Aunt        several  . Stroke Paternal Aunt        several  . Stroke Paternal Uncle        several  . Heart disease Paternal Uncle        several;2 had MI pre 34  . Cancer Daughter 88       breast ca, also with benign sessile polyp     Review of Systems  Constitutional: Negative for chills and fever.  Respiratory: Positive for shortness of breath (COPD, chronic, no change) and wheezing (occ). Negative for cough.   Cardiovascular: Negative for chest pain, palpitations and leg swelling.  Gastrointestinal: Negative for abdominal pain, blood in stool, constipation and diarrhea.  Neurological: Negative for light-headedness and headaches.        Objective:   Vitals:   07/09/19 0936  BP: 140/60  Pulse: 63  Temp: 98.2 F (36.8 C)  SpO2: 96%   BP Readings from Last 3 Encounters:  07/09/19 140/60  05/29/19 110/70  05/07/19 (!) 144/64   Wt Readings from Last 3 Encounters:  07/09/19 200 lb (90.7 kg)  05/29/19 197 lb 12.8 oz (89.7 kg)  05/07/19 199 lb (90.3 kg)   Body mass index is 26.57 kg/m.   Physical Exam    Constitutional: Appears well-developed and well-nourished. No distress.  HENT:  Head: Normocephalic and atraumatic.  Neck: Neck supple. No tracheal deviation present. No thyromegaly present.  No cervical lymphadenopathy Cardiovascular: Normal rate, regular rhythm and normal heart sounds.   No murmur heard. No carotid bruit .  No edema Pulmonary/Chest: Effort normal and breath sounds normal. No respiratory distress. No  has no wheezes. No rales.  Skin: Skin is warm and dry. Not diaphoretic.  Psychiatric: Normal mood and affect. Behavior is normal.      Assessment & Plan:    See Problem List for Assessment and Plan of chronic medical problems.

## 2019-07-09 ENCOUNTER — Other Ambulatory Visit: Payer: Self-pay | Admitting: Internal Medicine

## 2019-07-09 ENCOUNTER — Other Ambulatory Visit: Payer: Self-pay

## 2019-07-09 ENCOUNTER — Encounter: Payer: Self-pay | Admitting: Internal Medicine

## 2019-07-09 ENCOUNTER — Ambulatory Visit (INDEPENDENT_AMBULATORY_CARE_PROVIDER_SITE_OTHER): Payer: PPO | Admitting: Internal Medicine

## 2019-07-09 VITALS — BP 140/60 | HR 63 | Temp 98.2°F | Ht 72.75 in | Wt 200.0 lb

## 2019-07-09 DIAGNOSIS — E782 Mixed hyperlipidemia: Secondary | ICD-10-CM | POA: Diagnosis not present

## 2019-07-09 DIAGNOSIS — I480 Paroxysmal atrial fibrillation: Secondary | ICD-10-CM | POA: Diagnosis not present

## 2019-07-09 DIAGNOSIS — I10 Essential (primary) hypertension: Secondary | ICD-10-CM

## 2019-07-09 DIAGNOSIS — M5384 Other specified dorsopathies, thoracic region: Secondary | ICD-10-CM | POA: Diagnosis not present

## 2019-07-09 DIAGNOSIS — R7303 Prediabetes: Secondary | ICD-10-CM

## 2019-07-09 DIAGNOSIS — M25552 Pain in left hip: Secondary | ICD-10-CM | POA: Diagnosis not present

## 2019-07-09 DIAGNOSIS — M9902 Segmental and somatic dysfunction of thoracic region: Secondary | ICD-10-CM | POA: Diagnosis not present

## 2019-07-09 DIAGNOSIS — F3289 Other specified depressive episodes: Secondary | ICD-10-CM | POA: Diagnosis not present

## 2019-07-09 DIAGNOSIS — M9905 Segmental and somatic dysfunction of pelvic region: Secondary | ICD-10-CM | POA: Diagnosis not present

## 2019-07-09 LAB — POCT GLYCOSYLATED HEMOGLOBIN (HGB A1C): Hemoglobin A1C: 5.9 % — AB (ref 4.0–5.6)

## 2019-07-09 MED ORDER — FLUOXETINE HCL 40 MG PO CAPS: 40.0000 mg | ORAL_CAPSULE | Freq: Every day | ORAL | 3 refills | Status: DC

## 2019-07-09 NOTE — Assessment & Plan Note (Signed)
BP well controlled Current regimen effective and well tolerated Continue current medications at current doses  

## 2019-07-09 NOTE — Assessment & Plan Note (Signed)
Stable, controlled Continue fluoxetine 40 mg daily

## 2019-07-09 NOTE — Assessment & Plan Note (Signed)
Check A1c today Low sugar/carbohydrate diet Continue regular exercise

## 2019-07-09 NOTE — Assessment & Plan Note (Signed)
Following with cardiology Asymptomatic Taking Eliquis, metoprolol Blood work up-to-date

## 2019-07-09 NOTE — Assessment & Plan Note (Signed)
Cholesterol well controlled Continue statin

## 2019-07-10 ENCOUNTER — Telehealth (INDEPENDENT_AMBULATORY_CARE_PROVIDER_SITE_OTHER): Payer: PPO | Admitting: Internal Medicine

## 2019-07-10 DIAGNOSIS — G4733 Obstructive sleep apnea (adult) (pediatric): Secondary | ICD-10-CM

## 2019-07-10 DIAGNOSIS — I480 Paroxysmal atrial fibrillation: Secondary | ICD-10-CM | POA: Diagnosis not present

## 2019-07-10 DIAGNOSIS — I5032 Chronic diastolic (congestive) heart failure: Secondary | ICD-10-CM | POA: Diagnosis not present

## 2019-07-10 NOTE — Progress Notes (Signed)
Electrophysiology TeleHealth Note  Due to national recommendations of social distancing due to [place] 19, an audio telehealth visit is felt to be most appropriate for this patient at this time.  Verbal consent was obtained by me for the telehealth visit today.  The patient does not have capability for a virtual visit.  A phone visit is therefore required today.   Date:  07/10/2019   ID:  Troy Kindred [name], DOB August 23, 1941, MRN [id number]  Location: patient's home  Provider location:  [facility]  Evaluation Performed: Follow-up visit  PCP:  Binnie Rail, MD   Electrophysiologist:  Dr Rayann Heman  Chief Complaint:  palpitations  History of Present Illness:    Troy Vercher [name] is a 78 y.o. male who presents via telehealth conferencing today.  Since his ablation, the patient reports doing very well.   Denies procedure related complications. Today, he denies symptoms of palpitations, chest pain, shortness of breath,  lower extremity edema, dizziness, presyncope, or syncope.  The patient is otherwise without complaint today.  The patient denies symptoms of fevers, chills, cough, or new SOB worrisome for COVID 19.  Past Medical History:  Diagnosis Date   AAA (abdominal aortic aneurysm) ([place]) LAST ABDOMINAL US 10-20-17 3.3 CM   MONITORED BY DR [name]   Anxiety    Arthritis    Atrial fibrillation (HCC)    Basal cell carcinoma    Bilateral carotid artery stenosis DUPLEX 12-29-2012  BY DR [facility]   BILATERAL ICA STENOSIS 60-79%   BPH (benign prostatic hypertrophy)    Chronic diastolic heart failure (HCC)    Chronic kidney disease    stage 3, pt unaware   Colon cancer (HCC)    Complication of anesthesia    had nausea after carotid artery surgery, states the medicine given to help the nausea, made it worse   COPD (chronic obstructive pulmonary disease) (HCC)    Depression    GERD (gastroesophageal reflux disease)    Glaucoma BOTH EYES   Dr Gershon Crane    History of basal cell carcinoma excision    History of lung cancer APRIL 2012  SQUAMOUS CELL---- S/P RIGHT LOWER LOBECTOMY AT [facility] --  NO CHEMORADIATION---  NO RECURRENCE    ONCOLOGIST- DR Tressie Stalker  LOV IN [facility] 10-27-2012   History of pneumothorax    pt unaware   Hx of adenomatous colonic polyps 2005    X 2; 1 hyperplastic polyp; Dr Olevia Perches   Hyperlipidemia    Hypertension    Impaired fasting glucose 2007   108; A1c5.4%   Lesion of bladder    Lung cancer ([place]) 01/25/2012   Microhematuria    OSA (obstructive sleep apnea) 08/29/2015   CPAP SET ON 10   PAD (peripheral artery disease) ([place]) ABI'S  JAN 2014  0.65 ON RIGHT ;  1.04 ON LEFT   Peripheral vascular disease (HCC) S/P ANGIOPLASTY AND STENTING   FOLLOWED  BY DR [name]   Pleural effusion on right 11/18/2018   Chronic, noted on CXR   Spinal stenosis Sept. 2015   Status post placement of implantable loop recorder    Stroke [facility]) Jul 07, 2013   mini  TIA   Thoracic aorta atherosclerosis (HCC)    Thyrotoxicosis    amiodarone induced   Urgency of urination     Past Surgical History:  Procedure Laterality Date   ANGIO PLASTY     X 4 in legs   AORTOGRAM  07-27-2002   MILD DIFFUSE ILIAC ARTERY  OCCLUSIVE DISEASE /  LEFT RENAL ARTERY 20%/ PATENT LEFT FEM-POP GRAFT/ MILD SFA AND POPLITEAL ARTERY OCCLUSIVE DISEASE W/ SEVERE KIDNEY OCCLUSIVE DISEASE   ATRIAL FIBRILLATION ABLATION N/A 04/09/2019   Procedure: ATRIAL FIBRILLATION ABLATION;  Surgeon: Thompson Grayer, MD;  Location: [place] [facility];  Service: Cardiovascular;  Laterality: N/A;   BASAL CELL CARCINOMA EXCISION     MULTIPLE TIMES--  RIGHT FOREARM, CHEEKS, AND BACK   BIOPSY  06/25/2018   Procedure: BIOPSY;  Surgeon: Irving Copas., MD;  Location: [place];  Service: Gastroenterology;;   CARDIOVASCULAR STRESS TEST  12-08-2012  DR [facility]   NORMAL LEXISCAN WITH NO EXERCISE NUCLEAR STUDY/ EF 66%/   NO ISCHEMIA/ NO SIGNIFICANT CHANGE FROM PRIOR  STUDY   CARDIOVERSION N/A 02/14/2018   Procedure: CARDIOVERSION;  Surgeon: Larey Dresser, MD;  Location: [place];  Service: Cardiovascular;  Laterality: N/A;   CAROTID ANGIOGRAM N/A 07/10/2013   Procedure: CAROTID ANGIOGRAM;  Surgeon: Elam Dutch, MD;  Location: [facility] CATH LAB;  Service: Cardiovascular;  Laterality: N/A;   CAROTID ENDARTERECTOMY Right 07-14-13   cea   CATARACT EXTRACTION W/ INTRAOCULAR LENS  IMPLANT, BILATERAL     colon polyectomy     COLONOSCOPY WITH PROPOFOL N/A 06/25/2018   Procedure: COLONOSCOPY WITH PROPOFOL;  Surgeon: Rush Landmark Telford Nab., MD;  Location: [place];  Service: Gastroenterology;  Laterality: N/A;   CYSTOSCOPY W/ RETROGRADES Bilateral 01/21/2013   Procedure: CYSTOSCOPY WITH RETROGRADE PYELOGRAM;  Surgeon: Molli Hazard, MD;  Location: [facility];  Service: Urology;  Laterality: Bilateral;   CYSTO, BLADDER BIOPSY, BILATERAL RETROGRADE PYELOGRAM  RAD TECH FROM RADIOLOGY PER JOY   CYSTOSCOPY WITH BIOPSY N/A 01/21/2013   Procedure: CYSTOSCOPY WITH BIOPSY;  Surgeon: Molli Hazard, MD;  Location: [facility];  Service: Urology;  Laterality: N/A;   ENDARTERECTOMY Right 07/14/2013   Procedure: ENDARTERECTOMY CAROTID;  Surgeon: Angelia Mould, MD;  Location: [place];  Service: Vascular;  Laterality: Right;   EP IMPLANTABLE DEVICE N/A 08/12/2015   Procedure: Loop Recorder Insertion;  Surgeon: Thompson Grayer, MD;  Location: [place] [facility];  Service: Cardiovascular;  Laterality: N/A;   ESOPHAGOGASTRODUODENOSCOPY (EGD) WITH PROPOFOL N/A 06/25/2018   Procedure: ESOPHAGOGASTRODUODENOSCOPY (EGD) WITH PROPOFOL;  Surgeon: Rush Landmark Telford Nab., MD;  Location: [place];  Service: Gastroenterology;  Laterality: N/A;   ESOPHAGOGASTRODUODENOSCOPY (EGD) WITH PROPOFOL N/A 07/10/2018   Procedure: ESOPHAGOGASTRODUODENOSCOPY (EGD) WITH PROPOFOL;  Surgeon: Milus Banister, MD;  Location: [facility];   Service: Endoscopy;  Laterality: N/A;   EUS N/A 07/10/2018   Procedure: UPPER ENDOSCOPIC ULTRASOUND (EUS) RADIAL;  Surgeon: Milus Banister, MD;  Location: [facility];  Service: Endoscopy;  Laterality: N/A;   EYE SURGERY Right    FEMORAL-POPLITEAL BYPASS GRAFT Left [place]   AND 2001  IN [place]   FEMORAL-POPLITEAL BYPASS GRAFT Left 08/30/2015   Procedure: REVISION OF BYPASS GRAFT Left  FEMORAL-POPLITEAL ARTERY;  Surgeon: Angelia Mould, MD;  Location: [place];  Service: Vascular;  Laterality: Left;   FINE NEEDLE ASPIRATION N/A 07/10/2018   Procedure: FINE NEEDLE ASPIRATION (FNA) LINEAR;  Surgeon: Milus Banister, MD;  Location: [facility];  Service: Endoscopy;  Laterality: N/A;   FLEXIBLE SIGMOIDOSCOPY N/A 12/12/2018   Procedure: FLEXIBLE SIGMOIDOSCOPY;  Surgeon: Ileana Roup, MD;  Location: [facility];  Service: General;  Laterality: N/A;   LARYNGOSCOPY  06-27-2004   BX VOCAL CORD  (LEUKOPLAKIA)  PER PT NO ISSUES SINCE   LEFT HEART CATH AND  CORONARY ANGIOGRAPHY N/A 08/20/2017   Procedure: LEFT HEART CATH AND CORONARY ANGIOGRAPHY;  Surgeon: Larey Dresser, MD;  Location: [place] [facility];  Service: Cardiovascular;  Laterality: N/A;   LOOP RECORDER INSERTION N/A 04/09/2019   Procedure: LOOP RECORDER INSERTION;  Surgeon: Thompson Grayer, MD;  Location: [place] [facility];  Service: Cardiovascular;  Laterality: N/A;   LOOP RECORDER REMOVAL N/A 04/09/2019   Procedure: LOOP RECORDER REMOVAL;  Surgeon: Thompson Grayer, MD;  Location: [place] [facility];  Service: Cardiovascular;  Laterality: N/A;   LOWER EXTREMITY ANGIOGRAM Bilateral 08/29/2015   Procedure: Lower Extremity Angiogram;  Surgeon: Angelia Mould, MD;  Location: [place] [facility];  Service: Cardiovascular;  Laterality: Bilateral;   LUNG LOBECTOMY  01/24/2011    RIGHT UPPER LOBE  (SQUAMOUS CELL CARCINOMA) Dr Dorthea Cove , [facility]. No chemotherapyor radiation   PATCH ANGIOPLASTY Right 07/14/2013    Procedure: PATCH ANGIOPLASTY;  Surgeon: Angelia Mould, MD;  Location: [place];  Service: Vascular;  Laterality: Right;   PATCH ANGIOPLASTY Left 08/30/2015   Procedure: VEIN PATCH ANGIOPLASTY OF PROXIMAL Left BYPASS GRAFT;  Surgeon: Angelia Mould, MD;  Location: [place];  Service: Vascular;  Laterality: Left;   PERIPHERAL VASCULAR CATHETERIZATION N/A 08/29/2015   Procedure: Abdominal Aortogram;  Surgeon: Angelia Mould, MD;  Location: [place] [facility];  Service: Cardiovascular;  Laterality: N/A;   POLYPECTOMY  06/25/2018   Procedure: POLYPECTOMY;  Surgeon: Rush Landmark Telford Nab., MD;  Location: [place];  Service: Gastroenterology;;   SUBMUCOSAL INJECTION  06/25/2018   Procedure: SUBMUCOSAL INJECTION;  Surgeon: Irving Copas., MD;  Location: [place];  Service: Gastroenterology;;   TEE WITHOUT CARDIOVERSION N/A 02/14/2018   Procedure: TRANSESOPHAGEAL ECHOCARDIOGRAM (TEE);  Surgeon: Larey Dresser, MD;  Location: [facility] ENDOSCOPY;  Service: Cardiovascular;  Laterality: N/A;   trabecular surgery     OS   TRANSTHORACIC ECHOCARDIOGRAM  12-29-2012  DR [facility]   MILD LVH/  LVSF NORMAL/ EF 84-66%/  GRADE I DIASTOLIC DYSFUNCTION    Current Outpatient Medications  Medication Sig Dispense Refill   acetaminophen (TYLENOL) 325 MG tablet Take 325 mg by mouth every 6 (six) hours as needed for moderate pain or headache.      amLODipine (NORVASC) 5 MG tablet Take 5 mg by mouth every evening.     apixaban (ELIQUIS) 5 MG TABS tablet Take 1 tablet (5 mg total) by mouth 2 (two) times daily. 60 tablet 6   Ascorbic Acid (VITAMIN C PO) Take 1 tablet by mouth daily.     bimatoprost (LUMIGAN) 0.01 % SOLN Place 1 drop at bedtime into both eyes.      docusate sodium (COLACE) 100 MG capsule Take 100 mg by mouth at bedtime.      dorzolamide-timolol (COSOPT) 22.3-6.8 MG/ML ophthalmic solution Place 1 drop into both eyes 2 (two) times daily.      ferrous sulfate 325 (65 FE)  MG tablet Take 325 mg by mouth at bedtime.      FLUoxetine (PROZAC) 40 MG capsule Take 1 capsule (40 mg total) by mouth daily. 90 capsule 3   furosemide (LASIX) 20 MG tablet TAKE 2 TABLETS EACH MORNING. 56 tablet 0   Magnesium 500 MG CAPS Take 500 mg by mouth daily.      Melatonin 5 MG TABS Take 5 mg by mouth at bedtime.     metoprolol succinate (TOPROL-XL) 25 MG 24 hr tablet Take 0.5 tablets (12.5 mg total) by mouth at bedtime. Take with or immediately following  a meal. 45 tablet 1   montelukast (SINGULAIR) 10 MG tablet TAKE ONE TABLET AT BEDTIME. 30 tablet 3   Multiple Vitamins-Minerals (CENTRUM SILVER PO) Take 1 tablet by mouth daily.     Multiple Vitamins-Minerals (ZINC PO) Take 1 tablet by mouth daily.     omeprazole (PRILOSEC) 20 MG capsule TAKE (1) CAPSULE DAILY. 90 capsule 3   Polyethyl Glycol-Propyl Glycol (SYSTANE) 0.4-0.3 % SOLN Place 1 drop into both eyes daily as needed (for dry eyes).      potassium chloride SA (K-DUR,KLOR-CON) 20 MEQ tablet Take 1 tablet (20 mEq total) by mouth daily. 30 tablet 11   rosuvastatin (CRESTOR) 40 MG tablet TAKE 1 TABLET BY MOUTH DAILY. (Patient taking differently: Take 40 mg by mouth daily. ) 90 tablet 0   senna (SENOKOT) 8.6 MG tablet Take 1 tablet by mouth 2 (two) times daily.     SYMBICORT 160-4.5 MCG/ACT inhaler USE 2 PUFFS TWICE DAILY. 10.2 g 0   No current facility-administered medications for this visit.     Allergies:   Niacin-lovastatin er, Penicillins, Sulfonamide derivatives, and Atorvastatin   Social History:  The patient  reports that he quit smoking about 4 years ago. His smoking use included cigarettes. He has a 25.00 pack-year smoking history. He has never used smokeless tobacco. He reports current alcohol use. He reports that he does not use drugs.   Family History:  The patient's family history includes Alcohol abuse in his father; Cancer (age of onset: 48) in his daughter; Heart attack in his father; Heart disease in  his father, paternal aunt, and paternal uncle; Hypertension in his father and paternal aunt; Stroke in his father, mother, paternal aunt, and paternal uncle.   ROS:  Please see the history of present illness.   All other systems are personally reviewed and negative.    Exam:    Vital Signs:  There were no vitals taken for this visit.  Well sounding, alert and conversant  Labs/Other Tests and Data Reviewed:    Recent Labs: 07/14/2018: Pro B Natriuretic peptide (BNP) 279.0 11/18/2018: B Natriuretic Peptide 387.8 12/03/2018: ALT 26 12/16/2018: Magnesium 2.0 04/06/2019: BUN 19; Creatinine, Ser 1.26; Hemoglobin 12.9; Platelets 357; Potassium 4.4; Sodium 140 05/29/2019: TSH 3.52   Wt Readings from Last 3 Encounters:  07/09/19 200 lb (90.7 kg)  05/29/19 197 lb 12.8 oz (89.7 kg)  05/07/19 199 lb (90.3 kg)     Last device remote is reviewed from [facility] PDF which reveals normal device function, some afib noted   ASSESSMENT & PLAN:    1.  Paroxysmal atrial fibrillation/ atrial flutter Doing very well post ablation Denies complications He has had some ERAF but clinically is doing very well off AAD therapy Previously had thyroid issues with amiodarone.  We will avoid this going forward Continue anticoagulation with eliquis  2. Chronic diastolic dysfunction Stable No change required today  3. AAA/ PAD Followed by [facility]  4. OSA Compliant with CPAP  Follow-up:  Return to see me in 3 months   Patient Risk:  after full review of this patients clinical status, I feel that they are at moderate risk at this time.  Today, I have spent 15 minutes with the patient with telehealth technology discussing arrhythmia management .    Army Fossa, MD  07/10/2019 10:59 AM     [facility] [address] [place] [address] [phone] (office) [phone] (fax)

## 2019-07-13 DIAGNOSIS — M25552 Pain in left hip: Secondary | ICD-10-CM | POA: Diagnosis not present

## 2019-07-13 DIAGNOSIS — M9902 Segmental and somatic dysfunction of thoracic region: Secondary | ICD-10-CM | POA: Diagnosis not present

## 2019-07-13 DIAGNOSIS — M9905 Segmental and somatic dysfunction of pelvic region: Secondary | ICD-10-CM | POA: Diagnosis not present

## 2019-07-13 DIAGNOSIS — M5384 Other specified dorsopathies, thoracic region: Secondary | ICD-10-CM | POA: Diagnosis not present

## 2019-07-13 DIAGNOSIS — H401133 Primary open-angle glaucoma, bilateral, severe stage: Secondary | ICD-10-CM | POA: Diagnosis not present

## 2019-07-14 DIAGNOSIS — M9902 Segmental and somatic dysfunction of thoracic region: Secondary | ICD-10-CM | POA: Diagnosis not present

## 2019-07-14 DIAGNOSIS — M5384 Other specified dorsopathies, thoracic region: Secondary | ICD-10-CM | POA: Diagnosis not present

## 2019-07-14 DIAGNOSIS — M9905 Segmental and somatic dysfunction of pelvic region: Secondary | ICD-10-CM | POA: Diagnosis not present

## 2019-07-14 DIAGNOSIS — M25552 Pain in left hip: Secondary | ICD-10-CM | POA: Diagnosis not present

## 2019-07-16 DIAGNOSIS — M25552 Pain in left hip: Secondary | ICD-10-CM | POA: Diagnosis not present

## 2019-07-16 DIAGNOSIS — M9902 Segmental and somatic dysfunction of thoracic region: Secondary | ICD-10-CM | POA: Diagnosis not present

## 2019-07-16 DIAGNOSIS — M9905 Segmental and somatic dysfunction of pelvic region: Secondary | ICD-10-CM | POA: Diagnosis not present

## 2019-07-16 DIAGNOSIS — M5384 Other specified dorsopathies, thoracic region: Secondary | ICD-10-CM | POA: Diagnosis not present

## 2019-07-20 ENCOUNTER — Ambulatory Visit (INDEPENDENT_AMBULATORY_CARE_PROVIDER_SITE_OTHER): Payer: PPO | Admitting: *Deleted

## 2019-07-20 DIAGNOSIS — M9905 Segmental and somatic dysfunction of pelvic region: Secondary | ICD-10-CM | POA: Diagnosis not present

## 2019-07-20 DIAGNOSIS — M9902 Segmental and somatic dysfunction of thoracic region: Secondary | ICD-10-CM | POA: Diagnosis not present

## 2019-07-20 DIAGNOSIS — I5032 Chronic diastolic (congestive) heart failure: Secondary | ICD-10-CM

## 2019-07-20 DIAGNOSIS — M25552 Pain in left hip: Secondary | ICD-10-CM | POA: Diagnosis not present

## 2019-07-20 DIAGNOSIS — I480 Paroxysmal atrial fibrillation: Secondary | ICD-10-CM

## 2019-07-20 DIAGNOSIS — M5384 Other specified dorsopathies, thoracic region: Secondary | ICD-10-CM | POA: Diagnosis not present

## 2019-07-20 LAB — CUP PACEART REMOTE DEVICE CHECK
Date Time Interrogation Session: 20201016174258
Implantable Pulse Generator Implant Date: 20200709

## 2019-07-21 DIAGNOSIS — M5384 Other specified dorsopathies, thoracic region: Secondary | ICD-10-CM | POA: Diagnosis not present

## 2019-07-21 DIAGNOSIS — M25552 Pain in left hip: Secondary | ICD-10-CM | POA: Diagnosis not present

## 2019-07-21 DIAGNOSIS — M9902 Segmental and somatic dysfunction of thoracic region: Secondary | ICD-10-CM | POA: Diagnosis not present

## 2019-07-21 DIAGNOSIS — M9905 Segmental and somatic dysfunction of pelvic region: Secondary | ICD-10-CM | POA: Diagnosis not present

## 2019-07-23 DIAGNOSIS — M9902 Segmental and somatic dysfunction of thoracic region: Secondary | ICD-10-CM | POA: Diagnosis not present

## 2019-07-23 DIAGNOSIS — M25552 Pain in left hip: Secondary | ICD-10-CM | POA: Diagnosis not present

## 2019-07-23 DIAGNOSIS — M9905 Segmental and somatic dysfunction of pelvic region: Secondary | ICD-10-CM | POA: Diagnosis not present

## 2019-07-23 DIAGNOSIS — M5384 Other specified dorsopathies, thoracic region: Secondary | ICD-10-CM | POA: Diagnosis not present

## 2019-07-28 DIAGNOSIS — M25552 Pain in left hip: Secondary | ICD-10-CM | POA: Diagnosis not present

## 2019-07-28 DIAGNOSIS — M5384 Other specified dorsopathies, thoracic region: Secondary | ICD-10-CM | POA: Diagnosis not present

## 2019-07-28 DIAGNOSIS — M9902 Segmental and somatic dysfunction of thoracic region: Secondary | ICD-10-CM | POA: Diagnosis not present

## 2019-07-28 DIAGNOSIS — M9905 Segmental and somatic dysfunction of pelvic region: Secondary | ICD-10-CM | POA: Diagnosis not present

## 2019-07-30 DIAGNOSIS — M25552 Pain in left hip: Secondary | ICD-10-CM | POA: Diagnosis not present

## 2019-07-30 DIAGNOSIS — M9902 Segmental and somatic dysfunction of thoracic region: Secondary | ICD-10-CM | POA: Diagnosis not present

## 2019-07-30 DIAGNOSIS — M5384 Other specified dorsopathies, thoracic region: Secondary | ICD-10-CM | POA: Diagnosis not present

## 2019-07-30 DIAGNOSIS — M9905 Segmental and somatic dysfunction of pelvic region: Secondary | ICD-10-CM | POA: Diagnosis not present

## 2019-08-03 ENCOUNTER — Other Ambulatory Visit: Payer: Self-pay | Admitting: Internal Medicine

## 2019-08-03 ENCOUNTER — Other Ambulatory Visit (HOSPITAL_COMMUNITY): Payer: Self-pay | Admitting: Cardiology

## 2019-08-04 DIAGNOSIS — M25552 Pain in left hip: Secondary | ICD-10-CM | POA: Diagnosis not present

## 2019-08-04 DIAGNOSIS — M9905 Segmental and somatic dysfunction of pelvic region: Secondary | ICD-10-CM | POA: Diagnosis not present

## 2019-08-04 DIAGNOSIS — M9902 Segmental and somatic dysfunction of thoracic region: Secondary | ICD-10-CM | POA: Diagnosis not present

## 2019-08-04 DIAGNOSIS — M5384 Other specified dorsopathies, thoracic region: Secondary | ICD-10-CM | POA: Diagnosis not present

## 2019-08-06 DIAGNOSIS — M9905 Segmental and somatic dysfunction of pelvic region: Secondary | ICD-10-CM | POA: Diagnosis not present

## 2019-08-06 DIAGNOSIS — M9902 Segmental and somatic dysfunction of thoracic region: Secondary | ICD-10-CM | POA: Diagnosis not present

## 2019-08-06 DIAGNOSIS — M25552 Pain in left hip: Secondary | ICD-10-CM | POA: Diagnosis not present

## 2019-08-06 DIAGNOSIS — M5384 Other specified dorsopathies, thoracic region: Secondary | ICD-10-CM | POA: Diagnosis not present

## 2019-08-11 DIAGNOSIS — M9905 Segmental and somatic dysfunction of pelvic region: Secondary | ICD-10-CM | POA: Diagnosis not present

## 2019-08-11 DIAGNOSIS — M9902 Segmental and somatic dysfunction of thoracic region: Secondary | ICD-10-CM | POA: Diagnosis not present

## 2019-08-11 DIAGNOSIS — M5384 Other specified dorsopathies, thoracic region: Secondary | ICD-10-CM | POA: Diagnosis not present

## 2019-08-11 DIAGNOSIS — M25552 Pain in left hip: Secondary | ICD-10-CM | POA: Diagnosis not present

## 2019-08-19 ENCOUNTER — Ambulatory Visit (INDEPENDENT_AMBULATORY_CARE_PROVIDER_SITE_OTHER): Payer: PPO | Admitting: *Deleted

## 2019-08-19 DIAGNOSIS — R55 Syncope and collapse: Secondary | ICD-10-CM | POA: Diagnosis not present

## 2019-08-19 DIAGNOSIS — I480 Paroxysmal atrial fibrillation: Secondary | ICD-10-CM

## 2019-08-20 LAB — CUP PACEART REMOTE DEVICE CHECK
Date Time Interrogation Session: 20201118124446
Implantable Pulse Generator Implant Date: 20200709

## 2019-09-02 ENCOUNTER — Other Ambulatory Visit (HOSPITAL_COMMUNITY): Payer: Self-pay | Admitting: Cardiology

## 2019-09-02 ENCOUNTER — Other Ambulatory Visit: Payer: Self-pay | Admitting: Internal Medicine

## 2019-09-03 ENCOUNTER — Other Ambulatory Visit: Payer: Self-pay | Admitting: *Deleted

## 2019-09-03 ENCOUNTER — Telehealth: Payer: Self-pay | Admitting: Oncology

## 2019-09-03 DIAGNOSIS — C187 Malignant neoplasm of sigmoid colon: Secondary | ICD-10-CM

## 2019-09-03 NOTE — Telephone Encounter (Signed)
Called patient per providers request to convert 12/04 appointment to a virtual visit, patient agreed to do phone visit.

## 2019-09-04 ENCOUNTER — Other Ambulatory Visit: Payer: Self-pay

## 2019-09-04 ENCOUNTER — Inpatient Hospital Stay (HOSPITAL_BASED_OUTPATIENT_CLINIC_OR_DEPARTMENT_OTHER): Payer: PPO | Admitting: Oncology

## 2019-09-04 ENCOUNTER — Inpatient Hospital Stay: Payer: PPO | Attending: Oncology

## 2019-09-04 DIAGNOSIS — C187 Malignant neoplasm of sigmoid colon: Secondary | ICD-10-CM

## 2019-09-04 LAB — CBC WITH DIFFERENTIAL (CANCER CENTER ONLY)
Abs Immature Granulocytes: 0.02 10*3/uL (ref 0.00–0.07)
Basophils Absolute: 0 10*3/uL (ref 0.0–0.1)
Basophils Relative: 0 %
Eosinophils Absolute: 0.1 10*3/uL (ref 0.0–0.5)
Eosinophils Relative: 1 %
HCT: 37.9 % — ABNORMAL LOW (ref 39.0–52.0)
Hemoglobin: 12.1 g/dL — ABNORMAL LOW (ref 13.0–17.0)
Immature Granulocytes: 0 %
Lymphocytes Relative: 14 %
Lymphs Abs: 0.7 10*3/uL (ref 0.7–4.0)
MCH: 30 pg (ref 26.0–34.0)
MCHC: 31.9 g/dL (ref 30.0–36.0)
MCV: 94 fL (ref 80.0–100.0)
Monocytes Absolute: 0.3 10*3/uL (ref 0.1–1.0)
Monocytes Relative: 7 %
Neutro Abs: 3.8 10*3/uL (ref 1.7–7.7)
Neutrophils Relative %: 78 %
Platelet Count: 239 10*3/uL (ref 150–400)
RBC: 4.03 MIL/uL — ABNORMAL LOW (ref 4.22–5.81)
RDW: 14.6 % (ref 11.5–15.5)
WBC Count: 5 10*3/uL (ref 4.0–10.5)
nRBC: 0 % (ref 0.0–0.2)

## 2019-09-04 LAB — CMP (CANCER CENTER ONLY)
ALT: 15 U/L (ref 0–44)
AST: 17 U/L (ref 15–41)
Albumin: 3.6 g/dL (ref 3.5–5.0)
Alkaline Phosphatase: 100 U/L (ref 38–126)
Anion gap: 8 (ref 5–15)
BUN: 19 mg/dL (ref 8–23)
CO2: 27 mmol/L (ref 22–32)
Calcium: 9.2 mg/dL (ref 8.9–10.3)
Chloride: 106 mmol/L (ref 98–111)
Creatinine: 1.1 mg/dL (ref 0.61–1.24)
GFR, Est AFR Am: >60 mL/min (ref >60)
GFR, Estimated: >60 mL/min (ref >60)
Glucose, Bld: 147 mg/dL — ABNORMAL HIGH (ref 70–99)
Potassium: 4.4 mmol/L (ref 3.5–5.1)
Sodium: 141 mmol/L (ref 135–145)
Total Bilirubin: 0.6 mg/dL (ref 0.3–1.2)
Total Protein: 7 g/dL (ref 6.5–8.1)

## 2019-09-04 NOTE — Progress Notes (Signed)
[place] OFFICE VISIT PROGRESS NOTE  I connected with on 09/04/19 at 12:30 AM EST by telephone and verified that I am speaking with the correct person using two identifiers.   I discussed the limitations, risks, security and privacy concerns of performing an evaluation and management service by telemedicine and the availability of in-person appointments. I also discussed with the patient that there may be a patient responsible charge related to this service. The patient expressed understanding and agreed to proceed.  Other persons participating in the visit and their role in the encounter: Wife  Patient's location: Home Provider's location: Office    Diagnosis: Colon cancer, non-small cell lung cancer  INTERVAL HISTORY:   Troy [name] is seen for a telephone visit.  This is secondary to distancing with the Covid pandemic.  Good appetite.  He reports increased exertional dyspnea and fatigue.  He relates this to COPD.  No fever or cough.  No other complaint. He reports his thyroid function is now normal.  He has no longer taking thyroid medication.  Lab Results:  Lab Results  Component Value Date   WBC 5.0 09/04/2019   HGB 12.1 (L) 09/04/2019   HCT 37.9 (L) 09/04/2019   MCV 94.0 09/04/2019   PLT 239 09/04/2019   NEUTROABS 3.8 09/04/2019     Medications: I have reviewed the patient's current medications.  Assessment/Plan: 1. Adenocarcinoma of sigmoid colon-stage II (T3N0)  Colonoscopy 06/25/2018- multiple polyps in the ascending and descending colon, partially obstructing mass in the sigmoid colon-biopsy confirmed adenocarcinoma  CTs 06/25/2018-enlarging right paratracheal lymph node compared to November 2018, no other evidence of metastatic disease  Low anterior resection 12/12/2018, stage II (T3N0) adenocarcinoma the sigmoid colon, no loss of mismatch repair protein expression, MSI-stable   2.  History of iron deficiency  anemia Secondary to bleeding from tumor, exacerbated by anticoagulation with eliquis 3.Stage Ib squamous cell carcinoma of right lower lung s/pbronchoscopy, mediastinoscopy, right thoracoscopic lowerlobectomy 01/24/2011 -Path poorly differentiated squamous cell carcinoma with positive vascular invasion LN stations 4R, 4L, 7, 8, 9, 10, 11, and 12 w/o evidence of malignancy - pT2a,pN0,pMX stage IB  -managed and monitored per Dr. Elenor Quinones at [facility] Last Ct chest 05/01/18 stable, without concerning evidence of recurrence -CT 06/25/2018- nonspecific small bilateral nodules, enlarging superior right paratracheal node -EUS biopsy of the right paratracheal nodes (several rounded nodes measuring 4 cm in total) on 07/10/2018- negative for malignancy -CT chest 03/03/2019- stable postoperative appearance of the chest, unchanged mildly prominent right paratracheal lymph node, no evidence of metastatic disease  4. COPD 5. Atrial fibrillation 6. Thyrotoxicosis-potentially amiodarone related   Disposition: [name] is in clinical remission from colon cancer and non-small cell lung cancer.  I suspect the exertional dyspnea is related to COPD.  I recommended he follow-up with Dr. Melvyn Novas.  We will follow-up on the CEA from today.  He will return for an office visit, CEA, and restaging chest CT in approximately 4 months.  I will refer him to Dr. Rush Landmark for a surveillance colonoscopy.   I discussed the assessment and treatment plan with the patient. The patient was provided an opportunity to ask questions and all were answered. The patient agreed with the plan and demonstrated an understanding of the instructions.   The patient was advised to call back or seek an in-person evaluation if the symptoms worsen or if the condition fails to improve as anticipated.  I provided 22 minutes of telephone, chart review, and documentation  time during this encounter, and > 50% was spent counseling as documented under my  assessment & plan.  Troy [name] ANP/GNP-BC   09/04/2019 12:52 PM

## 2019-09-07 ENCOUNTER — Other Ambulatory Visit: Payer: Self-pay | Admitting: Internal Medicine

## 2019-09-07 ENCOUNTER — Telehealth: Payer: Self-pay | Admitting: Oncology

## 2019-09-07 ENCOUNTER — Ambulatory Visit (INDEPENDENT_AMBULATORY_CARE_PROVIDER_SITE_OTHER): Payer: PPO | Admitting: Internal Medicine

## 2019-09-07 ENCOUNTER — Telehealth: Payer: Self-pay

## 2019-09-07 DIAGNOSIS — J449 Chronic obstructive pulmonary disease, unspecified: Secondary | ICD-10-CM | POA: Diagnosis not present

## 2019-09-07 LAB — CEA (IN HOUSE-CHCC): CEA (CHCC-In House): 1.07 ng/mL (ref 0.00–5.00)

## 2019-09-07 MED ORDER — BUDESONIDE-FORMOTEROL FUMARATE 160-4.5 MCG/ACT IN AERO: INHALATION_SPRAY | RESPIRATORY_TRACT | 11 refills | Status: DC

## 2019-09-07 MED ORDER — OMEPRAZOLE 20 MG PO CPDR: DELAYED_RELEASE_CAPSULE | ORAL | Status: DC

## 2019-09-07 NOTE — Telephone Encounter (Signed)
TC to pt per Dr Benay Spice to let him know his CEA is normal. Patient verbalized understanding. No further problems pr concerns at this time.

## 2019-09-07 NOTE — Telephone Encounter (Signed)
I left a message regarding schedule  

## 2019-09-07 NOTE — Patient Instructions (Addendum)
Plan A = Automatic = Always=    Symbicort  160 Take 2 puffs first thing in am and then another 2 puffs about 12 hours later.  And prilosec 20 mg otc  Take 30- 60 min before your first and last meals of the day   Plan B = Backup (to supplement plan A, not to replace it) Only use your albuterol inhaler as a rescue medication to be used if you can't catch your breath by resting or doing a relaxed purse lip breathing pattern.  - The less you use it, the better it will work when you need it. - Ok to use the inhaler up to 2 puffs  every 4 hours if you must but call for appointment if use goes up over your usual need - Don't leave home without it !!  (think of it like the spare tire for your car)   GERD (REFLUX)  is an extremely common cause of respiratory symptoms just like yours , many times with no obvious heartburn at all.    It can be treated with medication, but also with lifestyle changes including elevation of the head of your bed (ideally with 6 -8inch blocks under the headboard of your bed),  Smoking cessation, avoidance of late meals, excessive alcohol, and avoid fatty foods, chocolate, peppermint, colas, red wine, and acidic juices such as orange juice.  NO MINT OR MENTHOL PRODUCTS SO NO COUGH DROPS  USE SUGARLESS CANDY INSTEAD (Jolley ranchers or Stover's or Life Savers) or even ice chips will also do - the key is to swallow to prevent all throat clearing. NO OIL BASED VITAMINS - use powdered substitutes.  Avoid fish oil when coughing.    Please schedule a follow up office visit in 2 weeks, sooner if needed  with all medications /inhalers/ solutions in hand so we can verify exactly what you are taking. This includes all medications from all doctors and over the counters - cxr on return

## 2019-09-07 NOTE — Progress Notes (Signed)
Subjective:    Patient ID: Troy Kenner [name], male   DOB: 01/18/1941     MRN: [id number]    Brief patient profile:  77  yowm quit smoking Oct 2014 s/p RLLobecty 2012 at [facility] with Ib Sq cell ca and no endobronchial dz at that time and with no RT / chemo being followed by [facility] / [facility] with last ct chest done in 03/2014 showing persistent moderate R effusion  due for f/u 04/2015 with new onset gen persistent   fatigue winter of 2015 then very abruptly ill 01/08/15 sore throat, cough congestion yellow mucus, light headed rx doxy / pred and referrred to pulmonary clninic 02/03/2015 by Dr Linna Darner > dx was pna> cleared  Out then f/u ct at [facility] 04/19/17 showed ? Nodules in RLL and referred himself to pulmonary clinic 08/08/2017    History of Present Illness  08/08/2017  ov/ re:  Consultation req by Dr Aundra Dubin  Chief Complaint  Patient presents with  . Follow-up    Pt is having SOB with exertion for last 6 months becoming bothersome. Pt is wheezing and has dry cough.   doe = sob getting dressed x 4-5 months  Sleeps ok Legs R > L claudication stops him before his breathing does  On amiodarone  X  03/05/17 but only 100 mg since 03/28/17  rec Complete the cardiac work up then return here to regroup if breathing not back to your level of satisfaction      08/20/17  Nl LHC/ lvedp =13 > referred back to pulmonary   09/02/2017  f/u ov/ re:   GOLD II copd/ on amiodarone and no pulmonary meds  Chief Complaint  Patient presents with  . Follow-up    Pt states Dr Aundra Dubin rec that he come back for f/u. He states his breathing is unchanged since the last visit here.   Walking regularly around his neighborhood including some hills now but really doesn't know how to pace himself and has not been observing his 02  Saturations and shows interest in rehabilitation. rec Keep walking with goal of 30 minutes daily at a pace where you are short of breath but never out of breath You need to buy a pulse oximeter and  monitor your oxygen level when you walk and call me if you notice a downward trend in your 02 saturations or exercise toleranc (goal is keep it above 90%)  We will be referring you for pulmonary rehab      Admit date: 01/29/2018 Discharge date: 02/01/2018  Brief Narrative: Troy [name] a76 y.o.male,with past medic history significant for COPD/lung cancer/atrial fibrillation  With multiple falls and weakness for the last to days. One week ago he presented to pulmonary for chest congestion and he was started on steroids and doxycyclinehiscongestion improved however he started getting weak and had 3 falls until today when he fell and sustained a laceration on his left forehead. CT of the head was negativeearachewas having increase in tremors in his upper extremities noted by family. No chest pains, shortness of breath, nausea or vomiting lately. His labs were almost at baseline with a slightly increased creatinine to 1.54 but he was noted to Be orthostatic in the emergency room .Patient admitted with recurrent syncopal episodes secondary to orthostasis.       Discharge Diagnoses:  Active Problems:   Near syncope   Syncope   Orthostatic hypotension syncope: Secondary to orthostatics dehydration patient being on Lasix and decreased p.o. intake.  Patient ambulated today with physical  therapy and with the staff without any complaints.  Patient will be discharged home on Lasix to be started at 20 mg 02/05/2018 and doxazosin to be started on 02/05/2018 to  2 mg daily.  Diltiazem DC'd.  #2 atrial fibrillation:Persistent but usually paroxysmal. Currently on Apixaban, amiodarone. Diltiazem currently on hold  #3 chronic diastolic ITG:PQDIYME compensated. Lasix currently on hold.  #4 COPD: Stable. Continue current treatment  #5 hyperlipidemia: Continue statin  #6 history of BRA:XENMMH.  #7 coronary artery disease:Patient had left heart Cath in November of last year with  nonobstructive disease. Continue follow up by cardiology  #8 hypertension:Patient has orthostatic hypotension in the moment. Holding medications.    07/14/2018 acute extended ov/ re: new doe on amiodarone since 11/27/16 per med review Chief Complaint  Patient presents with  . Acute Visit    Pt c/o increased DOE x 2 wks. He states he gets winded just getting dressed.  He also c/o wheezing and non prod cough.    Stopped symbicort shortly after last ov in 12/2017 and stayed on singulair  Started symbicort 160 x 2 back one hour prior to ov and feels a little better  Prior to taking symbicort sob with adl's and 5 laps in driveway flat 680 ft  Sleeping flat on side / one pillow/ cpap Cough is more day than noct  rec Continue symbicort 160 Take 2 puffs first thing in am and then another 2 puffs about 12 hours later Review with dr Aundra Dubin re high esr and low TSH ? Amiodarone effects > d/c'd 07/15/18      07/29/2018  f/u ov/ re: sob  Improved / underlying copd Moderate = GOLD II baseline Chief Complaint  Patient presents with  . Follow-up    Breathing has improved some. No new co's.   Dyspnea:  Not walking regularly but plans to start  - at this point Not limited by breathing from desired activities   Cough: some pnds/ not keeping him up at night at all  Sleeping: cpap flat rotated on side  SABA use: none  02: none   rec Continue symbicort 80  Take 2 puffs first thing in am and then another 2 puffs about 12 hours later if you think you need it.  Work on inhaler technique:  You are cleared for colon surgery from my perspective but you final clearance from the thyroid doctor and I will send Dr Dema Severin a copy of my thoughts    Virtual Visit via Telephone Note 09/07/2019   I connected with Troy Kindred [name] on 09/07/19 at  4:00 PM EST by telephone and verified that I am speaking with the correct person using two identifiers.   I discussed the limitations, risks, security and  privacy concerns of performing an evaluation and management service by telephone and the availability of in person appointments. I also discussed with the patient that there may be a patient responsible charge related to this service. The patient expressed understanding and agreed to proceed.   History of Present Illness: Dyspnea: gradually  Worse x 2 months to point of doe across the room Cough: mid afternoon / dry  Sleeping: fine flat  SABA use: has not using 02: none    No obvious day to day or daytime variability or assoc excess/ purulent sputum or mucus plugs or hemoptysis or cp or chest tightness, subjective wheeze or overt sinus or hb symptoms.    Also denies any obvious fluctuation of symptoms with weather or environmental  changes or other aggravating or alleviating factors except as outlined above.   Meds reviewed/ med reconciliation completed       Observations/Objective: Sounds ok on the phone / easily confused with details of care/ just taking symb 80 one bid    CT chest w contrast 03/03/2019 1. Stable postoperative chest status post right lower lobectomy. Mildly prominent right paratracheal node is unchanged, likely reactive. Stable right fibrothorax. 2. No evidence of metastatic disease. 3. Stable appearance of the visualized upper abdomen. 4. Aortic Atherosclerosis (ICD10-I70.0) and Emphysema (ICD10-J43.9).  Assessment and Plan: See problem list for active a/p's   Follow Up Instructions: See avs for instructions unique to this ov which includes revised/ updated med list     I discussed the assessment and treatment plan with the patient. The patient was provided an opportunity to ask questions and all were answered. The patient agreed with the plan and demonstrated an understanding of the instructions.   The patient was advised to call back or seek an in-person evaluation if the symptoms worsen or if the condition fails to improve as anticipated.  I provided 24  minutes of non-face-to-face time during this encounter.   Christinia Gully, MD

## 2019-09-08 ENCOUNTER — Encounter: Payer: Self-pay | Admitting: Internal Medicine

## 2019-09-14 ENCOUNTER — Telehealth: Payer: Self-pay | Admitting: Internal Medicine

## 2019-09-14 NOTE — Telephone Encounter (Signed)
Called and spoke with Patient. Patient stated he was taking his Symbicort 2 puffs twice a day, but his wife stated that was wrong. Went over Dr. Gustavus Bryant Symbicort instructions.  Understanding stated.  Nothing further at this time.  Per Dr. Melvyn Novas last [place] 09/07/19-   Instructions  Plan A = Automatic = Always=    Symbicort  160 Take 2 puffs first thing in am and then another 2 puffs about 12 hours later.  And prilosec 20 mg otc  Take 30- 60 min before your first and last meals of the day

## 2019-09-17 NOTE — Progress Notes (Signed)
Carelink Summary Report / Loop Recorder 

## 2019-09-21 ENCOUNTER — Ambulatory Visit (INDEPENDENT_AMBULATORY_CARE_PROVIDER_SITE_OTHER): Payer: PPO | Admitting: *Deleted

## 2019-09-21 DIAGNOSIS — R55 Syncope and collapse: Secondary | ICD-10-CM

## 2019-09-21 LAB — CUP PACEART REMOTE DEVICE CHECK
Date Time Interrogation Session: 20201221125424
Implantable Pulse Generator Implant Date: 20200709

## 2019-09-28 ENCOUNTER — Other Ambulatory Visit (HOSPITAL_COMMUNITY): Payer: Self-pay | Admitting: Cardiology

## 2019-10-07 ENCOUNTER — Ambulatory Visit (INDEPENDENT_AMBULATORY_CARE_PROVIDER_SITE_OTHER): Payer: PPO | Admitting: Psychiatry

## 2019-10-07 ENCOUNTER — Other Ambulatory Visit: Payer: Self-pay

## 2019-10-07 DIAGNOSIS — F4321 Adjustment disorder with depressed mood: Secondary | ICD-10-CM | POA: Diagnosis not present

## 2019-10-07 NOTE — Progress Notes (Signed)
PROBLEM-FOCUSED INITIAL PSYCHOTHERAPY EVALUATION Troy Moore, PhD LP [facility]  Name: Troy Lucci [name] Date: 10/07/2019 Time spent: 29 min MRN: [id number] DOB: August 06, 1941 Guardian/Payee: self  PCP: Troy Rail, MD Documentation requested on this visit: No  PROBLEM HISTORY Reason for Visit /Presenting Problem:  Chief Complaint  Patient presents with  . Establish Care  . Family Problem    Narrative/History of Present Illness Referred by self for treatment of irritability and depression.  PT reports "what brings me is my wife Troy [name]) and daughter Troy [name])."  C/o glaucoma taking awake his abilities to pay golf, hunt quail, read, drive.  "Beside myself" today about how "the great state of [place] [name] screwed me".  Worked all his life to provide for family, retired and Troy [name], financially fine, but unable physically to do what he prepared for.  Daughter Troy [name], who has benefited from therapy, recommended this.  C/o not being able to enjoy much.  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  Really last August when fully retired.  Son-in-law is now head of the company.  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    One worry with audit of the sale and setting up irrevocable trusts -- IRS investigating almost a year now, financial advisors tell him he Troy [name] put money in some places because it will trigger further audit.  With funds tied up and   Watches a lot of [employer], but even that gets old without freedom of movement.  Has a personal trainer come in 3/wk to his home.  [employer] under renovation.  Pandemic has established reservation policy for using the fitness center and thinned out the social interaction he used to have.  Troy get children to take him out now and then for food, but food at home is a lot of frozen meals and to-go orders.  No disagreement with wife about safety and pandemic, just hard to deal  with the restrictions.  Has a driver who will take him out about 2/wk.    Health concerns have included surgery for colon cancer, delayed 4 months due to a thyroid condition.  Thankful that it was caught, since he went in for what he thought was a stroke.  Has had lung cancer as well, c. 6-8 yrs ago, no chemo or radiation needed for either.  Clean at this point.    Does see the stress of his confinement putting stress on Troy [name].  Knows he Troy get irritable with her.  She is an established patient of Troy [name].    Will go on some trips with cherished buddies, at least observe hunting and the dogs, to the extent he Troy see.  Occasionally Troy pull a trigger.  Glaucoma entails being able to see some things off-focus  Prior Psychiatric Assessment/Treatment:   Outpatient treatment: none stated Psychiatric hospitalization: none stated Psychological assessment/testing: none stated   Abuse/neglect screening: Victim of abuse: Not assessed at this time / none suspected.   Victim of neglect: Not assessed at this time / none suspected.   Perpetrator of abuse/neglect: Not assessed at this time / none suspected.   Witness / Exposure to Domestic Violence: Not assessed at this time / none suspected.   Witness to Community Violence:  Not assessed at this time / none suspected.   Protective Services Involvement: No.   Report needed: No.    Substance abuse screening: Current substance abuse: No.  History of impactful substance use/abuse: deferred, suspected   FAMILY/SOCIAL HISTORY Family of origin -- deferred Family of intention/current living situation -- Lives with Troy [name], 47 yrs married at age 82.  Childhood neighbors, teen sweethearts before that.  Alludes to terrible regrets, and once in a while she Troy bring out the track record.   Education -- deferred Vocation -- retired Scientist, water quality of an Engineer, agricultural -- retirement, stable, no concerns Nutritional therapist --  Enjoyable activities --  hunting, golf  Other situational factors affecting treatment and prognosis: Stressors from the following areas: Health problems and Loss of sight Barriers to service: requires rides from wife due to eyesight, no expected impact Notable cultural sensitivities: none stated Strengths: Friends, Conservator, museum/gallery and Able to Communicate Effectively   MED/SURG HISTORY Med/surg history was partially reviewed with PT at this time.  Of note for psychotherapy at this time is diminishing eyesight and cardiac history. Past Medical History:  Diagnosis Date  . AAA (abdominal aortic aneurysm) ([place]) LAST ABDOMINAL US 10-20-17 3.3 CM   MONITORED BY Troy [name]  . Anxiety   . Arthritis   . Atrial fibrillation ([place])   . Basal cell carcinoma   . Bilateral carotid artery stenosis DUPLEX 12-29-2012  BY Troy [facility]   BILATERAL ICA STENOSIS 60-79%  . BPH (benign prostatic hypertrophy)   . Chronic diastolic heart failure ([place])   . Chronic kidney disease    stage 3, pt unaware  . Colon cancer ([place])   . Complication of anesthesia    had nausea after carotid artery surgery, states the medicine given to help the nausea, made it worse  . COPD (chronic obstructive pulmonary disease) ([place])   . Depression   . GERD (gastroesophageal reflux disease)   . Glaucoma BOTH EYES   Troy [name]  . History of basal cell carcinoma excision   . History of lung cancer APRIL 2012  SQUAMOUS CELL---- S/P RIGHT LOWER LOBECTOMY AT [facility] --  NO CHEMORADIATION---  NO RECURRENCE    ONCOLOGIST- Troy [name]  LOV IN [facility] 10-27-2012  . History of pneumothorax    pt unaware  . Hx of adenomatous colonic polyps 2005    X 2; 1 hyperplastic polyp; Troy [name]  . Hyperlipidemia   . Hypertension   . Impaired fasting glucose 2007   108; A1c5.4%  . Lesion of bladder   . Lung cancer ([place]) 01/25/2012  . Microhematuria   . OSA (obstructive sleep apnea) 08/29/2015   CPAP SET ON 10  . PAD (peripheral artery disease) ([place]) ABI'S  JAN 2014  0.65 ON  RIGHT ;  1.04 ON LEFT  . Peripheral vascular disease ([place]) S/P ANGIOPLASTY AND STENTING   FOLLOWED  BY Troy [name]  . Pleural effusion on right 11/18/2018   Chronic, noted on CXR  . Spinal stenosis Sept. 2015  . Status post placement of implantable loop recorder   . Stroke [facility]) Jul 07, 2013   mini  TIA  . Thoracic aorta atherosclerosis ([name])   . Thyrotoxicosis    amiodarone induced  . Urgency of urination      Past Surgical History:  Procedure Laterality Date  . ANGIO PLASTY     X 4 in legs  . AORTOGRAM  07-27-2002   MILD DIFFUSE ILIAC ARTERY OCCLUSIVE DISEASE /  LEFT RENAL ARTERY 20%/ PATENT LEFT FEM-POP GRAFT/ MILD SFA AND POPLITEAL ARTERY OCCLUSIVE DISEASE W/ SEVERE KIDNEY OCCLUSIVE DISEASE  . ATRIAL FIBRILLATION ABLATION N/A 04/09/2019   Procedure: ATRIAL FIBRILLATION ABLATION;  Surgeon:  Thompson Grayer, MD;  Location: [place] CV LAB;  Service: Cardiovascular;  Laterality: N/A;  . BASAL CELL CARCINOMA EXCISION     MULTIPLE TIMES--  RIGHT FOREARM, CHEEKS, AND BACK  . BIOPSY  06/25/2018   Procedure: BIOPSY;  Surgeon: Rush Landmark Telford Nab., MD;  Location: [place];  Service: Gastroenterology;;  . CARDIOVASCULAR STRESS TEST  12-08-2012  Troy MCLEAN   NORMAL LEXISCAN WITH NO EXERCISE NUCLEAR STUDY/ EF 66%/   NO ISCHEMIA/ NO SIGNIFICANT CHANGE FROM PRIOR STUDY  . CARDIOVERSION N/A 02/14/2018   Procedure: CARDIOVERSION;  Surgeon: Larey Dresser, MD;  Location: [facility] ENDOSCOPY;  Service: Cardiovascular;  Laterality: N/A;  . CAROTID ANGIOGRAM N/A 07/10/2013   Procedure: CAROTID ANGIOGRAM;  Surgeon: Elam Dutch, MD;  Location: [facility] CATH LAB;  Service: Cardiovascular;  Laterality: N/A;  . CAROTID ENDARTERECTOMY Right 07-14-13   cea  . CATARACT EXTRACTION W/ INTRAOCULAR LENS  IMPLANT, BILATERAL    . colon polyectomy    . COLONOSCOPY WITH PROPOFOL N/A 06/25/2018   Procedure: COLONOSCOPY WITH PROPOFOL;  Surgeon: Rush Landmark Telford Nab., MD;  Location: [place];  Service:  Gastroenterology;  Laterality: N/A;  . CYSTOSCOPY W/ RETROGRADES Bilateral 01/21/2013   Procedure: CYSTOSCOPY WITH RETROGRADE PYELOGRAM;  Surgeon: Molli Hazard, MD;  Location: [facility];  Service: Urology;  Laterality: Bilateral;   CYSTO, BLADDER BIOPSY, BILATERAL RETROGRADE PYELOGRAM  RAD TECH FROM RADIOLOGY PER JOY  . CYSTOSCOPY WITH BIOPSY N/A 01/21/2013   Procedure: CYSTOSCOPY WITH BIOPSY;  Surgeon: Molli Hazard, MD;  Location: [facility];  Service: Urology;  Laterality: N/A;  . ENDARTERECTOMY Right 07/14/2013   Procedure: ENDARTERECTOMY CAROTID;  Surgeon: Angelia Mould, MD;  Location: [place];  Service: Vascular;  Laterality: Right;  . EP IMPLANTABLE DEVICE N/A 08/12/2015   Procedure: Loop Recorder Insertion;  Surgeon: Thompson Grayer, MD;  Location: [place] CV LAB;  Service: Cardiovascular;  Laterality: N/A;  . ESOPHAGOGASTRODUODENOSCOPY (EGD) WITH PROPOFOL N/A 06/25/2018   Procedure: ESOPHAGOGASTRODUODENOSCOPY (EGD) WITH PROPOFOL;  Surgeon: Rush Landmark Telford Nab., MD;  Location: [place];  Service: Gastroenterology;  Laterality: N/A;  . ESOPHAGOGASTRODUODENOSCOPY (EGD) WITH PROPOFOL N/A 07/10/2018   Procedure: ESOPHAGOGASTRODUODENOSCOPY (EGD) WITH PROPOFOL;  Surgeon: Milus Banister, MD;  Location: [facility] ENDOSCOPY;  Service: Endoscopy;  Laterality: N/A;  . EUS N/A 07/10/2018   Procedure: UPPER ENDOSCOPIC ULTRASOUND (EUS) RADIAL;  Surgeon: Milus Banister, MD;  Location: [facility] ENDOSCOPY;  Service: Endoscopy;  Laterality: N/A;  . EYE SURGERY Right   . FEMORAL-POPLITEAL BYPASS GRAFT Left [facility]   AND [place]  [place]  . FEMORAL-POPLITEAL BYPASS GRAFT Left 08/30/2015   Procedure: REVISION OF BYPASS GRAFT Left  FEMORAL-POPLITEAL ARTERY;  Surgeon: Angelia Mould, MD;  Location: [place];  Service: Vascular;  Laterality: Left;  . FINE [name] ASPIRATION N/A 07/10/2018   Procedure: FINE [name] ASPIRATION (FNA) LINEAR;   Surgeon: Milus Banister, MD;  Location: [facility] ENDOSCOPY;  Service: Endoscopy;  Laterality: N/A;  . FLEXIBLE SIGMOIDOSCOPY N/A 12/12/2018   Procedure: FLEXIBLE SIGMOIDOSCOPY;  Surgeon: Ileana Roup, MD;  Location: [facility] ORS;  Service: General;  Laterality: N/A;  . LARYNGOSCOPY  06-27-2004   BX VOCAL CORD  (LEUKOPLAKIA)  PER PT NO ISSUES SINCE  . LEFT HEART CATH AND CORONARY ANGIOGRAPHY N/A 08/20/2017   Procedure: LEFT HEART CATH AND CORONARY ANGIOGRAPHY;  Surgeon: Larey Dresser, MD;  Location: [place] CV LAB;  Service: Cardiovascular;  Laterality: N/A;  . LOOP RECORDER INSERTION N/A 04/09/2019  Procedure: LOOP RECORDER INSERTION;  Surgeon: Thompson Grayer, MD;  Location: [place] CV LAB;  Service: Cardiovascular;  Laterality: N/A;  . LOOP RECORDER REMOVAL N/A 04/09/2019   Procedure: LOOP RECORDER REMOVAL;  Surgeon: Thompson Grayer, MD;  Location: [place] CV LAB;  Service: Cardiovascular;  Laterality: N/A;  . LOWER EXTREMITY ANGIOGRAM Bilateral 08/29/2015   Procedure: Lower Extremity Angiogram;  Surgeon: Angelia Mould, MD;  Location: [place] CV LAB;  Service: Cardiovascular;  Laterality: Bilateral;  . LUNG LOBECTOMY  01/24/2011    RIGHT UPPER LOBE  (SQUAMOUS CELL CARCINOMA) [place] , [facility]. No chemotherapyor radiation  . PATCH ANGIOPLASTY Right 07/14/2013   Procedure: PATCH ANGIOPLASTY;  Surgeon: Angelia Mould, MD;  Location: [place];  Service: Vascular;  Laterality: Right;  . PATCH ANGIOPLASTY Left 08/30/2015   Procedure: VEIN PATCH ANGIOPLASTY OF PROXIMAL Left BYPASS GRAFT;  Surgeon: Angelia Mould, MD;  Location: [place];  Service: Vascular;  Laterality: Left;  . PERIPHERAL VASCULAR CATHETERIZATION N/A 08/29/2015   Procedure: Abdominal Aortogram;  Surgeon: Angelia Mould, MD;  Location: [place] [facility];  Service: Cardiovascular;  Laterality: N/A;  . POLYPECTOMY  06/25/2018   Procedure: POLYPECTOMY;  Surgeon: Rush Landmark Telford Nab., MD;  Location: [place];  Service: Gastroenterology;;  . Lia Foyer INJECTION  06/25/2018   Procedure: SUBMUCOSAL INJECTION;  Surgeon: Irving Copas., MD;  Location: [place];  Service: Gastroenterology;;  . TEE WITHOUT CARDIOVERSION N/A 02/14/2018   Procedure: TRANSESOPHAGEAL ECHOCARDIOGRAM (TEE);  Surgeon: Larey Dresser, MD;  Location: [facility] ENDOSCOPY;  Service: Cardiovascular;  Laterality: N/A;  . trabecular surgery     OS  . TRANSTHORACIC ECHOCARDIOGRAM  12-29-2012  Troy [facility]   MILD LVH/  LVSF NORMAL/ EF 41-66%/  GRADE I DIASTOLIC DYSFUNCTION    Allergies  Allergen Reactions  . Niacin-Lovastatin Er Shortness Of Breath and Other (See Comments)    Dyspnea, flushing  . Penicillins Hives, Shortness Of Breath and Other (See Comments)    Flushing  & dyspnea Because of a history of documented adverse serious drug reaction;Medi Alert bracelet  is recommended PCN reaction causing immediate rash, facial/tongue/throat swelling, SOB or lightheadedness with hypotension: Yes PCN reaction causing severe rash involving mucus membranes or skin necrosis: No PCN reaction occurring within the last 10 years: NO PCN reaction that required hospitalization: NO  . Sulfonamide Derivatives Hives, Shortness Of Breath and Other (See Comments)    Flushing & dyspnea Because of a history of documented adverse serious drug reaction;Medi Alert bracelet  is recommended  . Atorvastatin Other (See Comments)    Myalgias & athralgias- takes Crestor, DOES NOT LIKE LIPITOR    Medications (as listed in Epic): Current Outpatient Medications  Medication Sig Dispense Refill  . acetaminophen (TYLENOL) 325 MG tablet Take 325 mg by mouth every 6 (six) hours as needed for moderate pain or headache.     [name] amLODipine (NORVASC) 5 MG tablet TAKE 1 TABLET ONCE DAILY. 28 tablet 0  . Ascorbic Acid (VITAMIN C PO) Take 1 tablet by mouth daily.    . bimatoprost (LUMIGAN) 0.01 % SOLN Place 1 drop at bedtime into both eyes.     .  budesonide-formoterol (SYMBICORT) 160-4.5 MCG/ACT inhaler Take 2 puffs first thing in am and then another 2 puffs about 12 hours later. 10.2 g 11  . docusate sodium (COLACE) 100 MG capsule Take 100 mg by mouth at bedtime.     . dorzolamide-timolol (COSOPT) 22.3-6.8 MG/ML ophthalmic solution Place 1 drop into both eyes 2 (  two) times daily.     [name] ELIQUIS 5 MG TABS tablet TAKE 1 TABLET BY MOUTH TWICE DAILY. 56 tablet 0  . ferrous sulfate 325 (65 FE) MG tablet Take 325 mg by mouth at bedtime.     [name] FLUoxetine (PROZAC) 40 MG capsule Take 1 capsule (40 mg total) by mouth daily. 90 capsule 3  . furosemide (LASIX) 20 MG tablet TAKE 2 TABLETS EACH MORNING. 56 tablet 0  . Magnesium 500 MG CAPS Take 500 mg by mouth daily.     . Melatonin 5 MG TABS Take 5 mg by mouth at bedtime.    . metoprolol succinate (TOPROL-XL) 25 MG 24 hr tablet Take 0.5 tablets (12.5 mg total) by mouth at bedtime. Take with or immediately following a meal. 45 tablet 1  . montelukast (SINGULAIR) 10 MG tablet TAKE ONE TABLET AT BEDTIME. 30 tablet 5  . Multiple Vitamins-Minerals (CENTRUM SILVER PO) Take 1 tablet by mouth daily.    . Multiple Vitamins-Minerals (ZINC PO) Take 1 tablet by mouth daily.    [name] omeprazole (PRILOSEC) 20 MG capsule Take 30- 60 min before your first and last meals of the day    . Polyethyl Glycol-Propyl Glycol (SYSTANE) 0.4-0.3 % SOLN Place 1 drop into both eyes daily as needed (for dry eyes).     . potassium chloride SA (KLOR-CON) 20 MEQ tablet TAKE 1 TABLET ONCE DAILY. 28 tablet 0  . rosuvastatin (CRESTOR) 40 MG tablet TAKE 1 TABLET BY MOUTH DAILY. (Patient taking differently: Take 40 mg by mouth daily. ) 90 tablet 0  . senna (SENOKOT) 8.6 MG tablet Take 1 tablet by mouth 2 (two) times daily.    . SYMBICORT 160-4.5 MCG/ACT inhaler USE 2 PUFFS TWICE DAILY. 10.2 g 5   No current facility-administered medications for this visit.    MENTAL STATUS AND OBSERVATIONS Appearance:   Casual     Behavior:  Appropriate  and initially stiff  Motor:  Shuffling Gait  Speech/Language:   Clear and Coherent  Affect:  Appropriate  Mood:  irritable, responsive  Thought process:  normal  Thought content:    WNL  Sensory/Perceptual disturbances:    WNL  Orientation:  Fully oriented  Attention:  Good  Concentration:  Good  Memory:  WNL  Fund of knowledge:   Good  Insight:    Good  Judgment:   Good  Impulse Control:  Good   Initial Risk Assessment: Danger to self: No Self-injurious behavior: No Danger to others: No Physical aggression / violence: No Duty to warn: No Access to firearms a concern: No Gang involvement: No Patient / guardian was educated about steps to take if suicide or homicide risk level increases between visits: yes . While future psychiatric events cannot be accurately predicted, the patient does not currently require acute inpatient psychiatric care and does not currently meet [facility] involuntary commitment criteria.   DIAGNOSIS:    ICD-10-CM   1. Adjustment disorder with depressed mood  F43.21     INITIAL TREATMENT: . Support/validation provided for distressing symptoms and confirmed rapport . Ethical orientation and informed consent confirmed re: o privacy rights -- including but not limited to HIPAA, EMR and use of e-PHI o patient responsibilities -- scheduling, fair notice of changes, in-person vs. telehealth and regulatory and financial conditions affecting choice o expectations for working relationship in psychotherapy o needs and consents for working partnerships and exchange of information with other health care providers, especially any medication and other behavioral health providers .  Initial orientation to cognitive-behavioral and solution-focused therapy approach . Psychoeducation and initial recommendations: o Discussed activities of interest -- tonight plans to go back to investment club, after a year.  Lost ability to read due to diminished eyesight but has, and  is interested in, audio books, may turn them on.  Encouraged in picking up anything enjoyable that he may have put off while stewing about losing his sight. o Discussed possibility that there are adaptive techniques worked out for golf and hunting for the visually impaired -- offer to start researching, PT says he will do it . Outlook for therapy -- scheduling constraints, availability of crisis service, inclusion of family member(s) as appropriate  Plan: . Initial homework to follow through with investment club visit and audio books . Consider researching occupational therapy interventions for golf and hunting for the visually impaired  . Maintain medication as prescribed and work faithfully with relevant prescriber(s) if any changes are desired or seem indicated . Call the clinic on-call service, present to ER, or call 911 if any life-threatening psychiatric crisis Return for session(s) already scheduled; cancel Jan 20.  Blanchie Serve, PhD  Troy Moore, PhD LP Clinical Psychologist, [facility] [address] [place], [place] [address] [phone]

## 2019-10-12 ENCOUNTER — Ambulatory Visit: Payer: PPO | Admitting: Internal Medicine

## 2019-10-14 ENCOUNTER — Other Ambulatory Visit: Payer: Self-pay

## 2019-10-14 ENCOUNTER — Ambulatory Visit (INDEPENDENT_AMBULATORY_CARE_PROVIDER_SITE_OTHER): Payer: PPO | Admitting: Psychiatry

## 2019-10-14 DIAGNOSIS — F4321 Adjustment disorder with depressed mood: Secondary | ICD-10-CM

## 2019-10-14 NOTE — Progress Notes (Signed)
Psychotherapy Progress Note [facility] Luan Moore, PhD LP  Patient ID: Troy Kindred [name]     MRN: [id number]     Therapy format: Individual psychotherapy Date: 10/14/2019      Start: 3:24p     Stop: 4:13p     Time Spent: 49 min Location: In-person   Session narrative (presenting needs, interim history, self-report of stressors and symptoms, applications of prior therapy, status changes, and interventions made in session) Cancelled travel for a hunting trip -- best interest not to get on an airplane with pandemic as it is right now.  Got vaccine scheduled.  Knows he has a number of risk factors, and the 2-week quarantine on return was not worth it.    Discussed the possibility of auditory golf.  Not the season, but has people he can ask to look into it. Looked up blind golfing association in session -- will research further on his own time.  Not walking so much, but got out this morning with personal trainer, doing "laps" in driveway around the block.  Will do a local hunting trip this weekend, at a [place] club he belongs to [facility]) to break boredom.  Will travel with son in law.  Always enjoys taking guests and employees and seeing how they react to the amenities.  Has enjoyed reading, has audiobooks, just hasn't bothered to load them up.  Describes a fairly simple process, and the unit is charged, just needs to initiate.  Resolved to open one tonight, in fact.    Figures he needs to tone down how much news he watches, and he is disgusted with developments last week.  Saw reason for both parties to wake up in the [employer] last week.  Would like to see universal term limits, unlikely as they may be to get passed.  Has enjoyed volunteering at [employer], checking people in and out or escorting pts to rehab.  If it were still available and capable, he would.  Therapeutic modalities: Cognitive Behavioral Therapy and Solution-Oriented/Positive Psychology  Mental  Status/Observations:  Appearance:   Casual     Behavior:  Appropriate  Motor:  Normal, exc. shuffling gait  Speech/Language:   Clear and Coherent  Affect:  Appropriate  Mood:  dysthymic and more responsive  Thought process:  normal  Thought content:    WNL  Sensory/Perceptual disturbances:    WNL  Orientation:  Fully oriented  Attention:  Good  Concentration:  Good  Memory:  WNL  Insight:    Good  Judgment:   Good  Impulse Control:  Good   Risk Assessment: Danger to Self: No Self-injurious Behavior: No Danger to Others: No Physical Aggression / Violence: No Duty to Warn: No Access to Firearms a concern: No  Assessment of progress:  progressing  Diagnosis:   ICD-10-CM   1. Adjustment disorder with depressed mood  F43.21    Plan:  . Look further into visually impaired golf . Try out an audio book tonight . Continue self-care, including walking, as able and accompanied . Other recommendations/advice as noted above . Continue to utilize previously learned skills ad lib . Maintain medication as prescribed and work faithfully with relevant prescriber(s) if any changes are desired or seem indicated . Call the clinic on-call service, present to ER, or call 911 if any life-threatening psychiatric crisis Return in about 2 weeks (around 10/28/2019). Next scheduled in this office 10/28/2019  Blanchie Serve, PhD Luan Moore, PhD LP Clinical Psychologist, [facility] [address] [place],  [address] [phone]

## 2019-10-15 DIAGNOSIS — Z85828 Personal history of other malignant neoplasm of skin: Secondary | ICD-10-CM | POA: Diagnosis not present

## 2019-10-15 DIAGNOSIS — L570 Actinic keratosis: Secondary | ICD-10-CM | POA: Diagnosis not present

## 2019-10-15 DIAGNOSIS — L821 Other seborrheic keratosis: Secondary | ICD-10-CM | POA: Diagnosis not present

## 2019-10-18 ENCOUNTER — Ambulatory Visit: Payer: PPO | Attending: Internal Medicine

## 2019-10-18 DIAGNOSIS — Z23 Encounter for immunization: Secondary | ICD-10-CM | POA: Insufficient documentation

## 2019-10-18 NOTE — Progress Notes (Signed)
   [id number]  Name:  Troy Larmon [name]    MRN: [id number] DOB: [birth year]  10/18/2019  [name] was observed post Covid-19 immunization for 15 minutes without incidence. He was provided with Vaccine Information Sheet and instruction to access the V-Safe system.   [name] was instructed to call 911 with any severe reactions post vaccine: [name] Difficulty breathing  . Swelling of your face and throat  . A fast heartbeat  . A bad rash all over your body  . Dizziness and weakness    1:03

## 2019-10-20 ENCOUNTER — Ambulatory Visit: Payer: PPO | Admitting: Gastroenterology

## 2019-10-21 ENCOUNTER — Ambulatory Visit: Payer: PPO | Admitting: Psychiatry

## 2019-10-26 ENCOUNTER — Ambulatory Visit (INDEPENDENT_AMBULATORY_CARE_PROVIDER_SITE_OTHER): Payer: PPO | Admitting: *Deleted

## 2019-10-26 ENCOUNTER — Other Ambulatory Visit (HOSPITAL_COMMUNITY): Payer: Self-pay | Admitting: Cardiology

## 2019-10-26 DIAGNOSIS — I509 Heart failure, unspecified: Secondary | ICD-10-CM

## 2019-10-26 DIAGNOSIS — R0602 Shortness of breath: Secondary | ICD-10-CM

## 2019-10-26 DIAGNOSIS — R55 Syncope and collapse: Secondary | ICD-10-CM | POA: Diagnosis not present

## 2019-10-26 LAB — CUP PACEART REMOTE DEVICE CHECK
Date Time Interrogation Session: 20210123125546
Implantable Pulse Generator Implant Date: 20200709

## 2019-10-28 ENCOUNTER — Other Ambulatory Visit: Payer: Self-pay

## 2019-10-28 ENCOUNTER — Ambulatory Visit (INDEPENDENT_AMBULATORY_CARE_PROVIDER_SITE_OTHER): Payer: PPO | Admitting: Psychiatry

## 2019-10-28 DIAGNOSIS — Z87891 Personal history of nicotine dependence: Secondary | ICD-10-CM

## 2019-10-28 DIAGNOSIS — Z7142 Counseling for family member of alcoholic: Secondary | ICD-10-CM | POA: Diagnosis not present

## 2019-10-28 DIAGNOSIS — Z87898 Personal history of other specified conditions: Secondary | ICD-10-CM

## 2019-10-28 DIAGNOSIS — Z8673 Personal history of transient ischemic attack (TIA), and cerebral infarction without residual deficits: Secondary | ICD-10-CM | POA: Diagnosis not present

## 2019-10-28 DIAGNOSIS — F4321 Adjustment disorder with depressed mood: Secondary | ICD-10-CM

## 2019-10-28 NOTE — Progress Notes (Signed)
Psychotherapy Progress Note [facility] Troy Moore, PhD LP  Patient ID: Troy Kindred [name]     MRN: [id number] Therapy format: Individual psychotherapy Date: 10/28/2019      Start: 1:15p     Stop: 2:00p     Time Spent: 45 min Location: In-person   Session narrative (presenting needs, interim history, self-report of stressors and symptoms, applications of prior therapy, status changes, and interventions made in session) 79yo English lab needed emergency surgery Sunday after picking up a thorn and an infection hunting.  His trip to [facility] was provocative enough, with the cold snap endured outdoors, to start considering not going hunting any more.  Poses question about alcohol treatment for 76yo sister [name], who has a longstanding drinking problem, was hospitalized.  Dried out, got out, switched to wine, been cancelling medical appointments.  Motivated while in hospital for issues, loses motivation once she gets home.  Not clear if she is motivated now, but pattern of just 1 glass turned back into more and more and then 2 bottles of vodka.  Had a fall, possible concussion, memory losses.  [facility] would be an obvious choice, but wonders if there are alternatives.    Discussed her background and tactics for her motivation to get, and keep, help.  She has a roommate, [name], a longtime friend, who has enabled some, drinking herself and rationalizing but can be an Multimedia programmer.  Family history of father alcoholic.  PT had history of mother calling him over to look for and destroy F's stashes of vodka.  PT's own history of 3-4 days a week going to the club and having several, until he had his mini-stroke, kicked both alcohol and cigarettes, didn't look back.  Been too occupied with other things to look up blind golfing, but still means to.  Therapeutic modalities: Cognitive Behavioral Therapy, Solution-Oriented/Positive Psychology and 12-Step  Mental  Status/Observations:  Appearance:   Casual     Behavior:  Appropriate  Motor:  Normal  Speech/Language:   Clear and Coherent  Affect:  Appropriate  Mood:  normal  Thought process:  normal  Thought content:    WNL  Sensory/Perceptual disturbances:    WNL  Orientation:  Fully oriented  Attention:  Good  Concentration:  Good  Memory:  WNL  Insight:    Good  Judgment:   Good  Impulse Control:  Good   Risk Assessment: Danger to Self: No Self-injurious Behavior: No Danger to Others: No Physical Aggression / Violence: No Duty to Warn: No Access to Firearms a concern: No  Assessment of progress:  progressing  Diagnosis:   ICD-10-CM   1. Adjustment disorder with depressed mood  F43.21   2. Counseling for family member of alcoholic  I34.74   3. History of alcohol use  Z87.898   4. History of nicotine use  Z87.891   5. History of ischemic stroke  Z86.73    Plan:  . Use tactics for motivating sister back into rehab, as able . Look further into visually impaired golf when able . Continue audio books . Continue self-care, including walking, as able and accompanied . Other recommendations/advice as may be noted above . Continue to utilize previously learned skills ad lib . Maintain medication as prescribed and work faithfully with relevant prescriber(s) if any changes are desired or seem indicated . Call the clinic on-call service, present to ER, or call 911 if any life-threatening psychiatric crisis . Return in 2 weeks (on 11/11/2019), or prefers later  than 1pm if possible, for session(s) already scheduled.  Next scheduled visit in this office 11/04/2019.  Blanchie Serve, PhD Troy Moore, PhD LP Clinical Psychologist, [facility] [address], [place], [place] [address] [phone]

## 2019-10-29 ENCOUNTER — Telehealth: Payer: PPO | Admitting: Internal Medicine

## 2019-10-30 ENCOUNTER — Encounter (HOSPITAL_COMMUNITY): Payer: PPO

## 2019-10-30 ENCOUNTER — Other Ambulatory Visit (HOSPITAL_COMMUNITY): Payer: PPO

## 2019-10-30 ENCOUNTER — Ambulatory Visit: Payer: PPO

## 2019-11-02 ENCOUNTER — Telehealth (INDEPENDENT_AMBULATORY_CARE_PROVIDER_SITE_OTHER): Payer: PPO | Admitting: Internal Medicine

## 2019-11-02 ENCOUNTER — Encounter: Payer: Self-pay | Admitting: Internal Medicine

## 2019-11-02 VITALS — Ht 73.0 in | Wt 195.0 lb

## 2019-11-02 DIAGNOSIS — I480 Paroxysmal atrial fibrillation: Secondary | ICD-10-CM | POA: Diagnosis not present

## 2019-11-02 DIAGNOSIS — G4733 Obstructive sleep apnea (adult) (pediatric): Secondary | ICD-10-CM

## 2019-11-02 DIAGNOSIS — I10 Essential (primary) hypertension: Secondary | ICD-10-CM | POA: Diagnosis not present

## 2019-11-02 NOTE — Progress Notes (Signed)
Electrophysiology TeleHealth Note  Due to national recommendations of social distancing due to [place] 19, an audio telehealth visit is felt to be most appropriate for this patient at this time.  Verbal consent was obtained by me for the telehealth visit today.  The patient does not have capability for a virtual visit.  A phone visit is therefore required today.   Date:  11/02/2019   ID:  Troy Kindred [name], DOB 17-Oct-1940, MRN [id number]  Location: patient's home  Provider location:  [place]  Evaluation Performed: Follow-up visit  PCP:  Binnie Rail, MD   Electrophysiologist:  Dr Rayann Heman  Chief Complaint:  SOB  History of Present Illness:    Troy Waas [name] is a 79 y.o. male who presents via telehealth conferencing today.  Since last being seen in our clinic, the patient reports doing very well.  He has chronic stable SOB.  Today, he denies symptoms of palpitations, chest pain,  lower extremity edema, dizziness, presyncope, or syncope.  The patient is otherwise without complaint today.  The patient denies symptoms of fevers, chills, cough, or new SOB worrisome for COVID 19.  Past Medical History:  Diagnosis Date  . AAA (abdominal aortic aneurysm) ([place]) LAST ABDOMINAL US 10-20-17 3.3 CM   MONITORED BY DR [name]  . Anxiety   . Arthritis   . Atrial fibrillation ([place])   . Basal cell carcinoma   . Bilateral carotid artery stenosis DUPLEX 12-29-2012  BY DR [facility]   BILATERAL ICA STENOSIS 60-79%  . BPH (benign prostatic hypertrophy)   . Chronic diastolic heart failure ([place])   . Chronic kidney disease    stage 3, pt unaware  . Colon cancer ([place])   . Complication of anesthesia    had nausea after carotid artery surgery, states the medicine given to help the nausea, made it worse  . COPD (chronic obstructive pulmonary disease) ([place])   . Depression   . GERD (gastroesophageal reflux disease)   . Glaucoma BOTH EYES   Dr Gershon Crane  . History of basal cell carcinoma  excision   . History of lung cancer APRIL 2012  SQUAMOUS CELL---- S/P RIGHT LOWER LOBECTOMY AT [facility] --  NO CHEMORADIATION---  NO RECURRENCE    ONCOLOGIST- DR Tressie Stalker  LOV IN [facility] 10-27-2012  . History of pneumothorax    pt unaware  . Hx of adenomatous colonic polyps 2005    X 2; 1 hyperplastic polyp; Dr Olevia Perches  . Hyperlipidemia   . Hypertension   . Impaired fasting glucose 2007   108; A1c5.4%  . Lesion of bladder   . Lung cancer ([name]) 01/25/2012  . Microhematuria   . OSA (obstructive sleep apnea) 08/29/2015   CPAP SET ON 10  . PAD (peripheral artery disease) ([place]) ABI'S  JAN 2014  0.65 ON RIGHT ;  1.04 ON LEFT  . Peripheral vascular disease ([place]) S/P ANGIOPLASTY AND STENTING   FOLLOWED  BY DR [name]  . Pleural effusion on right 11/18/2018   Chronic, noted on CXR  . Spinal stenosis Sept. 2015  . Status post placement of implantable loop recorder   . Stroke [facility]) Jul 07, 2013   mini  TIA  . Thoracic aorta atherosclerosis ([place])   . Thyrotoxicosis    amiodarone induced  . Urgency of urination     Past Surgical History:  Procedure Laterality Date  . ANGIO PLASTY     X 4 in legs  . AORTOGRAM  07-27-2002   MILD DIFFUSE  ILIAC ARTERY OCCLUSIVE DISEASE /  LEFT RENAL ARTERY 20%/ PATENT LEFT FEM-POP GRAFT/ MILD SFA AND POPLITEAL ARTERY OCCLUSIVE DISEASE W/ SEVERE KIDNEY OCCLUSIVE DISEASE  . ATRIAL FIBRILLATION ABLATION N/A 04/09/2019   Procedure: ATRIAL FIBRILLATION ABLATION;  Surgeon: Thompson Grayer, MD;  Location: [place] CV LAB;  Service: Cardiovascular;  Laterality: N/A;  . BASAL CELL CARCINOMA EXCISION     MULTIPLE TIMES--  RIGHT FOREARM, CHEEKS, AND BACK  . BIOPSY  06/25/2018   Procedure: BIOPSY;  Surgeon: Rush Landmark Telford Nab., MD;  Location: [place];  Service: Gastroenterology;;  . CARDIOVASCULAR STRESS TEST  12-08-2012  DR MCLEAN   NORMAL LEXISCAN WITH NO EXERCISE NUCLEAR STUDY/ EF 66%/   NO ISCHEMIA/ NO SIGNIFICANT CHANGE FROM PRIOR STUDY  . CARDIOVERSION N/A  02/14/2018   Procedure: CARDIOVERSION;  Surgeon: Larey Dresser, MD;  Location: [facility] ENDOSCOPY;  Service: Cardiovascular;  Laterality: N/A;  . CAROTID ANGIOGRAM N/A 07/10/2013   Procedure: CAROTID ANGIOGRAM;  Surgeon: Elam Dutch, MD;  Location: [facility] CATH LAB;  Service: Cardiovascular;  Laterality: N/A;  . CAROTID ENDARTERECTOMY Right 07-14-13   cea  . CATARACT EXTRACTION W/ INTRAOCULAR LENS  IMPLANT, BILATERAL    . colon polyectomy    . COLONOSCOPY WITH PROPOFOL N/A 06/25/2018   Procedure: COLONOSCOPY WITH PROPOFOL;  Surgeon: Rush Landmark Telford Nab., MD;  Location: [place];  Service: Gastroenterology;  Laterality: N/A;  . CYSTOSCOPY W/ RETROGRADES Bilateral 01/21/2013   Procedure: CYSTOSCOPY WITH RETROGRADE PYELOGRAM;  Surgeon: Molli Hazard, MD;  Location: [facility];  Service: Urology;  Laterality: Bilateral;   CYSTO, BLADDER BIOPSY, BILATERAL RETROGRADE PYELOGRAM  RAD TECH FROM RADIOLOGY PER JOY  . CYSTOSCOPY WITH BIOPSY N/A 01/21/2013   Procedure: CYSTOSCOPY WITH BIOPSY;  Surgeon: Molli Hazard, MD;  Location: [facility];  Service: Urology;  Laterality: N/A;  . ENDARTERECTOMY Right 07/14/2013   Procedure: ENDARTERECTOMY CAROTID;  Surgeon: Angelia Mould, MD;  Location: [place];  Service: Vascular;  Laterality: Right;  . EP IMPLANTABLE DEVICE N/A 08/12/2015   Procedure: Loop Recorder Insertion;  Surgeon: Thompson Grayer, MD;  Location: [place] CV LAB;  Service: Cardiovascular;  Laterality: N/A;  . ESOPHAGOGASTRODUODENOSCOPY (EGD) WITH PROPOFOL N/A 06/25/2018   Procedure: ESOPHAGOGASTRODUODENOSCOPY (EGD) WITH PROPOFOL;  Surgeon: Rush Landmark Telford Nab., MD;  Location: [place];  Service: Gastroenterology;  Laterality: N/A;  . ESOPHAGOGASTRODUODENOSCOPY (EGD) WITH PROPOFOL N/A 07/10/2018   Procedure: ESOPHAGOGASTRODUODENOSCOPY (EGD) WITH PROPOFOL;  Surgeon: Milus Banister, MD;  Location: [facility] ENDOSCOPY;  Service: Endoscopy;   Laterality: N/A;  . EUS N/A 07/10/2018   Procedure: UPPER ENDOSCOPIC ULTRASOUND (EUS) RADIAL;  Surgeon: Milus Banister, MD;  Location: [facility] ENDOSCOPY;  Service: Endoscopy;  Laterality: N/A;  . EYE SURGERY Right   . FEMORAL-POPLITEAL BYPASS GRAFT Left [facility]   AND 2001  IN [place]  . FEMORAL-POPLITEAL BYPASS GRAFT Left 08/30/2015   Procedure: REVISION OF BYPASS GRAFT Left  FEMORAL-POPLITEAL ARTERY;  Surgeon: Angelia Mould, MD;  Location: [place];  Service: Vascular;  Laterality: Left;  . FINE NEEDLE ASPIRATION N/A 07/10/2018   Procedure: FINE NEEDLE ASPIRATION (FNA) LINEAR;  Surgeon: Milus Banister, MD;  Location: [facility] ENDOSCOPY;  Service: Endoscopy;  Laterality: N/A;  . FLEXIBLE SIGMOIDOSCOPY N/A 12/12/2018   Procedure: FLEXIBLE SIGMOIDOSCOPY;  Surgeon: Ileana Roup, MD;  Location: [facility] ORS;  Service: General;  Laterality: N/A;  . LARYNGOSCOPY  06-27-2004   BX VOCAL CORD  (LEUKOPLAKIA)  PER PT NO ISSUES SINCE  . LEFT HEART  CATH AND CORONARY ANGIOGRAPHY N/A 08/20/2017   Procedure: LEFT HEART CATH AND CORONARY ANGIOGRAPHY;  Surgeon: Larey Dresser, MD;  Location: [place] [facility] LAB;  Service: Cardiovascular;  Laterality: N/A;  . LOOP RECORDER INSERTION N/A 04/09/2019   Procedure: LOOP RECORDER INSERTION;  Surgeon: Thompson Grayer, MD;  Location: [place] [facility];  Service: Cardiovascular;  Laterality: N/A;  . LOOP RECORDER REMOVAL N/A 04/09/2019   Procedure: LOOP RECORDER REMOVAL;  Surgeon: Thompson Grayer, MD;  Location: [place] CV LAB;  Service: Cardiovascular;  Laterality: N/A;  . LOWER EXTREMITY ANGIOGRAM Bilateral 08/29/2015   Procedure: Lower Extremity Angiogram;  Surgeon: Angelia Mould, MD;  Location: [place] CV LAB;  Service: Cardiovascular;  Laterality: Bilateral;  . LUNG LOBECTOMY  01/24/2011    RIGHT UPPER LOBE  (SQUAMOUS CELL CARCINOMA) Dr Dorthea Cove , [facility]. No chemotherapyor radiation  . PATCH ANGIOPLASTY Right 07/14/2013   Procedure: PATCH  ANGIOPLASTY;  Surgeon: Angelia Mould, MD;  Location: [place];  Service: Vascular;  Laterality: Right;  . PATCH ANGIOPLASTY Left 08/30/2015   Procedure: VEIN PATCH ANGIOPLASTY OF PROXIMAL Left BYPASS GRAFT;  Surgeon: Angelia Mould, MD;  Location: [place];  Service: Vascular;  Laterality: Left;  . PERIPHERAL VASCULAR CATHETERIZATION N/A 08/29/2015   Procedure: Abdominal Aortogram;  Surgeon: Angelia Mould, MD;  Location: [facility];  Service: Cardiovascular;  Laterality: N/A;  . POLYPECTOMY  06/25/2018   Procedure: POLYPECTOMY;  Surgeon: Rush Landmark Telford Nab., MD;  Location: [place];  Service: Gastroenterology;;  . Lia Foyer INJECTION  06/25/2018   Procedure: SUBMUCOSAL INJECTION;  Surgeon: Irving Copas., MD;  Location: [place];  Service: Gastroenterology;;  . TEE WITHOUT CARDIOVERSION N/A 02/14/2018   Procedure: TRANSESOPHAGEAL ECHOCARDIOGRAM (TEE);  Surgeon: Larey Dresser, MD;  Location: [facility] ENDOSCOPY;  Service: Cardiovascular;  Laterality: N/A;  . trabecular surgery     OS  . TRANSTHORACIC ECHOCARDIOGRAM  12-29-2012  DR [facility]   MILD LVH/  LVSF NORMAL/ EF 47-09%/  GRADE I DIASTOLIC DYSFUNCTION    Current Outpatient Medications  Medication Sig Dispense Refill  . acetaminophen (TYLENOL) 325 MG tablet Take 325 mg by mouth every 6 (six) hours as needed for moderate pain or headache.     [name] amLODipine (NORVASC) 5 MG tablet TAKE 1 TABLET ONCE DAILY. 28 tablet 0  . Ascorbic Acid (VITAMIN C PO) Take 1 tablet by mouth daily.    . bimatoprost (LUMIGAN) 0.01 % SOLN Place 1 drop at bedtime into both eyes.     [name] docusate sodium (COLACE) 100 MG capsule Take 100 mg by mouth at bedtime.     . dorzolamide-timolol (COSOPT) 22.3-6.8 MG/ML ophthalmic solution Place 1 drop into both eyes 2 (two) times daily.     [name] ELIQUIS 5 MG TABS tablet TAKE 1 TABLET BY MOUTH TWICE DAILY. 56 tablet 0  . ferrous sulfate 325 (65 FE) MG tablet Take 325 mg by mouth at bedtime.       [name] FLUoxetine (PROZAC) 40 MG capsule Take 1 capsule (40 mg total) by mouth daily. 90 capsule 3  . furosemide (LASIX) 20 MG tablet TAKE 2 TABLETS EACH MORNING. 56 tablet 0  . Magnesium 500 MG CAPS Take 500 mg by mouth daily.     . Melatonin 5 MG TABS Take 5 mg by mouth at bedtime.    . metoprolol succinate (TOPROL-XL) 25 MG 24 hr tablet Take 0.5 tablets (12.5 mg total) by mouth at bedtime. Take with or immediately following a meal. 45 tablet  1  . montelukast (SINGULAIR) 10 MG tablet TAKE ONE TABLET AT BEDTIME. 30 tablet 5  . Multiple Vitamins-Minerals (CENTRUM SILVER PO) Take 1 tablet by mouth daily.    . Multiple Vitamins-Minerals (ZINC PO) Take 1 tablet by mouth daily.    [name] omeprazole (PRILOSEC) 20 MG capsule Take 30- 60 min before your first and last meals of the day    . Polyethyl Glycol-Propyl Glycol (SYSTANE) 0.4-0.3 % SOLN Place 1 drop into both eyes daily as needed (for dry eyes).     . potassium chloride SA (KLOR-CON) 20 MEQ tablet TAKE 1 TABLET ONCE DAILY. 28 tablet 0  . rosuvastatin (CRESTOR) 40 MG tablet TAKE 1 TABLET BY MOUTH DAILY. 90 tablet 0  . senna (SENOKOT) 8.6 MG tablet Take 1 tablet by mouth 2 (two) times daily.    . SYMBICORT 160-4.5 MCG/ACT inhaler USE 2 PUFFS TWICE DAILY. 10.2 g 5   No current facility-administered medications for this visit.    Allergies:   Niacin-lovastatin er, Penicillins, Sulfonamide derivatives, and Atorvastatin   Social History:  The patient  reports that he quit smoking about 5 years ago. His smoking use included cigarettes. He has a 25.00 pack-year smoking history. He has never used smokeless tobacco. He reports current alcohol use. He reports that he does not use drugs.   Family History:  The patient's family history includes Alcohol abuse in his father; Cancer (age of onset: 50) in his daughter; Heart attack in his father; Heart disease in his father, paternal aunt, and paternal uncle; Hypertension in his father and paternal aunt; Stroke in his  father, mother, paternal aunt, and paternal uncle.   ROS:  Please see the history of present illness.   All other systems are personally reviewed and negative.    Exam:    Vital Signs:  Ht 6\' 1"  (1.854 m)   Wt 195 lb (88.5 kg)   BMI 25.73 kg/m   Well sounding, alert and conversant   Labs/Other Tests and Data Reviewed:    Recent Labs: 11/18/2018: B Natriuretic Peptide 387.8 12/16/2018: Magnesium 2.0 05/29/2019: TSH 3.52 09/04/2019: ALT 15; BUN 19; Creatinine 1.10; Hemoglobin 12.1; Platelet Count 239; Potassium 4.4; Sodium 141   Wt Readings from Last 3 Encounters:  11/02/19 195 lb (88.5 kg)  07/09/19 200 lb (90.7 kg)  05/29/19 197 lb 12.8 oz (89.7 kg)     Last device remote is reviewed from [facility] which reveals primarily sinus with PACs   ASSESSMENT & PLAN:    1.  Paroxysmal atrial fibrillation/ atrial flutter Though his ILR says that his AF burden is 11%, every episode I have reviewed from recent transmission is actually sinus with PACs. No changes at this time chads2vasc score is at least 6.  He is on eliquis  2. OSA Compliance with CPAP is encouraged  3. Chronic diastolic dysfunction Stable No change required today    Follow-up:  3 months in AF clinic   Patient Risk:  after full review of this patients clinical status, I feel that they are at moderate risk at this time.  Today, I have spent 15 minutes with the patient with telehealth technology discussing arrhythmia management .    SignedThompson Grayer, MD  11/02/2019 2:32 PM     [place] [address] [place] [address] [phone] (office) [phone] (fax)

## 2019-11-04 ENCOUNTER — Ambulatory Visit (INDEPENDENT_AMBULATORY_CARE_PROVIDER_SITE_OTHER): Payer: PPO | Admitting: Psychiatry

## 2019-11-04 ENCOUNTER — Other Ambulatory Visit: Payer: Self-pay

## 2019-11-04 DIAGNOSIS — F4321 Adjustment disorder with depressed mood: Secondary | ICD-10-CM

## 2019-11-04 DIAGNOSIS — Z7142 Counseling for family member of alcoholic: Secondary | ICD-10-CM

## 2019-11-04 NOTE — Progress Notes (Signed)
Psychotherapy Progress Note [facility] Troy Moore, PhD LP  Patient ID: Troy Kindred [name]     MRN: [id number] Therapy format: Individual psychotherapy Date: 11/04/2019      Start: 2:10p     Stop: 3:00p     Time Spent: 50 min Location: In-person   Session narrative (presenting needs, interim history, self-report of stressors and symptoms, applications of prior therapy, status changes, and interventions made in session) Good week.  Had dinner with sister, saw her initially try to order coffee, ask "permission" to have a chardonnay, ordered one -- while her roommate [name] ordered martinis and his driver Legrand Como was at the table.  Driver noticed how unsteady [name] was walking out.  Not sure there is any trend toward her being more sober, or less.  Has shared [place] referral with her, not interested, so he's dropping it.  Discussed tactics should she renew her interest.  Good week looking after himself with Bella Kennedy out of town.  Found it cute that Bella Kennedy called him 7 times one day to see if he was OK.  Says he is capable of heating up frozen dinners, taking care of himself in the house.  The big aggravation is not being able to drive.  Reveals he got stopped, not ticketed, but told he is past being able to drive with his eyesight and ability to track the road.  Has reduced [employer], got sick of hearing the same old grievances.  Realized he was addicted to listening up for the bad news.  Able to have principled, respectful political conversations with his driver, who identifies differently, and considers the exchanges illuminating.  Exercising at home 3 d/wk, since last March when the workout center at the [place] club closed.  Personal trainer comes to the house, which helps.  Typically does driveway laps, stretches, and hand weights for an hour or more, all told.  Affirmed and encouraged.  Reiterated idea of adaptive equipment and orientation for blind golf and blind  shooting.  Still interested, but figures he'll wait for milder weather to look into it.  Therapeutic modalities: Cognitive Behavioral Therapy and Solution-Oriented/Positive Psychology  Mental Status/Observations:  Appearance:   Casual     Behavior:  Appropriate  Motor:  Normal exc shuffling gait  Speech/Language:   Clear and Coherent  Affect:  Appropriate  Mood:  less irritated  Thought process:  normal  Thought content:    WNL  Sensory/Perceptual disturbances:     hard of hearing  Orientation:  Fully oriented  Attention:  Good  Concentration:  Good  Memory:  grossly intact  Insight:    Fair  Judgment:   Good  Impulse Control:  Good   Risk Assessment: Danger to Self: No Self-injurious Behavior: No Danger to Others: No Physical Aggression / Violence: No Duty to Warn: No Access to Firearms a concern: No  Assessment of progress:  progressing  Diagnosis:   ICD-10-CM   1. Adjustment disorder with depressed mood  F43.21   2. Counseling for family member of alcoholic  J18.84    Plan:  . Encourage to look into adaptive golf, shooting.  May require an occupational therapist. . Manage exposure to angry content on TV . Other recommendations/advice as may be noted above . Continue to utilize previously learned skills ad lib . Maintain medication as prescribed and work faithfully with relevant prescriber(s) if any changes are desired or seem indicated . Call the clinic on-call service, present to ER, or call 911  if any life-threatening psychiatric crisis . Return in about 2 weeks (around 11/18/2019).[name]  Next scheduled visit in this office 11/11/2019.  Blanchie Serve, PhD Troy Moore, PhD LP Clinical Psychologist, [facility] [address], [place], [place] [address] [phone]

## 2019-11-08 ENCOUNTER — Ambulatory Visit: Payer: PPO | Attending: Internal Medicine

## 2019-11-08 DIAGNOSIS — Z23 Encounter for immunization: Secondary | ICD-10-CM

## 2019-11-08 NOTE — Progress Notes (Signed)
   [id number]  Name:  Troy Graeff [name]    MRN: [id number] DOB: 16-Oct-1940  11/08/2019  Troy [name] was observed post Covid-19 immunization for 15 minutes without incidence. He was provided with Vaccine Information Sheet and instruction to access the V-Safe system.   Troy [name] was instructed to call 911 with any severe reactions post vaccine: [name] Difficulty breathing  . Swelling of your face and throat  . A fast heartbeat  . A bad rash all over your body  . Dizziness and weakness    Immunizations Administered    Name Date Dose VIS Date Route   Pfizer COVID-19 Vaccine 11/08/2019 11:24 AM 0.3 mL 09/11/2019 Intramuscular   Manufacturer: [place]   Lot: [id number]   [name]: [id number]

## 2019-11-10 DIAGNOSIS — H401133 Primary open-angle glaucoma, bilateral, severe stage: Secondary | ICD-10-CM | POA: Diagnosis not present

## 2019-11-11 ENCOUNTER — Other Ambulatory Visit: Payer: Self-pay

## 2019-11-11 ENCOUNTER — Ambulatory Visit (INDEPENDENT_AMBULATORY_CARE_PROVIDER_SITE_OTHER): Payer: PPO | Admitting: Psychiatry

## 2019-11-11 DIAGNOSIS — Z8673 Personal history of transient ischemic attack (TIA), and cerebral infarction without residual deficits: Secondary | ICD-10-CM

## 2019-11-11 DIAGNOSIS — F4321 Adjustment disorder with depressed mood: Secondary | ICD-10-CM

## 2019-11-11 NOTE — Progress Notes (Signed)
Psychotherapy Progress Note [facility] Luan Moore, PhD LP  Patient ID: [name]     MRN: [id number] Therapy format: Individual psychotherapy Date: 11/11/2019      Start: 2:30p     Stop: 3:15p     Time Spent: 45 min Location: In-person   Session narrative (presenting needs, interim history, self-report of stressors and symptoms, applications of prior therapy, status changes, and interventions made in session) Did an audiobook, started a second one.  Enjoying pretty well.  Called off another hunting trip, not worth the weather, cold, inability to see, and finds himself not missing it.  Seems to have worked through the anger stage, and not particularly depressed.  Has had some long interest in eventually learning to [place], idea of finding a black cook to teach him.   Discussed possibility of asking W, who used to cook actively and well, but figures he would be too impatient to do that.  Not yet sure if he wants to pursue it for lessons with someone else.  Ophthalmology yesterday, pressure steady but has noticeably lost some vision last 6 months.  Viona Gilmore Bella Kennedy has arranged refrigerator to where his items are in known places on the left side.    Very favorable experience getting COVID vaccine through the [facility] clinic set up at the [facility].  Re. future planning, has been to [facility] to get name on list for entry in est. 5 years.    Discussed schedule -- OK to thin to q 2 wks.    Therapeutic modalities: Cognitive Behavioral Therapy and Solution-Oriented/Positive Psychology  Mental Status/Observations:  Appearance:   Casual     Behavior:  Appropriate  Motor:  Normal and Shuffling Gait  Speech/Language:   Clear and Coherent  Affect:  Negative  Mood:  normal  Thought process:  normal  Thought content:    WNL  Sensory/Perceptual disturbances:    WNL  Orientation:  Fully oriented  Attention:  Good  Concentration:  Good  Memory:  grossly intact   Insight:    Good  Judgment:   Good  Impulse Control:  Good   Risk Assessment: Danger to Self: No Self-injurious Behavior: No Danger to Others: No Physical Aggression / Violence: No Duty to Warn: No Access to Firearms a concern: No  Assessment of progress:  progressing  Diagnosis:   ICD-10-CM   1. Adjustment disorder with depressed mood  F43.21   2. History of ischemic stroke  Z86.73    Plan:  . Recommend bring home magnifying glass from office to help make things more visible . Option to practice discerning objects by feel, e.g., measuring cup sizes, to train supplemental senses . Other recommendations/advice as may be noted above . Continue to utilize previously learned skills ad lib . Maintain medication as prescribed and work faithfully with relevant prescriber(s) if any changes are desired or seem indicated . Call the clinic on-call service, present to ER, or call 911 if any life-threatening psychiatric crisis . Return in about 2 weeks (around 11/25/2019).[name]  Next scheduled visit in this office 11/25/2019.  Blanchie Serve, PhD Luan Moore, PhD LP Clinical Psychologist, [facility] [address] [place], [place] [address] [phone]

## 2019-11-13 ENCOUNTER — Ambulatory Visit: Payer: PPO | Admitting: Gastroenterology

## 2019-11-16 NOTE — Progress Notes (Signed)
Office Note     CC:  follow up Requesting Provider:  Binnie Rail, MD  HPI: Nilo Fallin [name] is a 79 y.o. (11-15-1940) male who presents for routine follow up of peripheral artery disease.   He is s/p left fem-pop bypass graft in [place] in 1994. Dr. [name] revised this in 2001. He had angioplasty of this in 2016. His bypass has been patent without any recurrent stenosis on surveillance.   He has been having left lower extremity pain that started a couple months ago. He states that it begins in his left lower back, radiates around to his anterior left thigh, then down to his left knee and anterior left leg. No pain Left foot. This occurs mostly at night when lying down if he moves certain ways. He describes it as a sharp nerve like pain that progresses to cramping like pain in the muscle. He will get a sensation of pain while sitting occasionally. He says he will also get the sensation in the right thigh and low back but more so the left side. He has been seeing a chiropractor with some improvement. He states that it is very uncomfortable but is not limiting his activity. He also has a Physiological scientist that he works with 3x a week and he is able to perform the exercises without any limitations. He otherwise denies any claudication or rest pain symptoms. He does not have any non healing wounds.   He is s/p right carotid endarterectomy in October of 2014. He denies any TIA or stroke like symptoms. He has not had any headaches, dizziness, facial drooping, slurred speech, weakness or numbness of upper or lower extremities  He has history of AAA which was last evaluated in 2019 at which time it was 3.3. x 3.7. He denies an abdominal pain or back pain aside from that described above.  Patient did have robotic anterior resection of his sigmoid colon on 12/12/18 for colon cancer. He has done well following this.  He also states he had an ablation for his atrial fibrillation in April 2020. He  denies any chest pain or shortness of breath  The pt is on a statin for cholesterol management.  The pt is not on a daily aspirin.   Other AC:  Eliquis (a. Fib) The pt is on amlodipine for hypertension.   The pt is not diabetic.   Tobacco hx:  Former smoker, quit 2015  Past Medical History:  Diagnosis Date  . AAA (abdominal aortic aneurysm) ([place]) LAST ABDOMINAL US 10-20-17 3.3 CM   MONITORED BY DR [name]  . Anxiety   . Arthritis   . Atrial fibrillation ([place])   . Basal cell carcinoma   . Bilateral carotid artery stenosis DUPLEX 12-29-2012  BY DR [facility]   BILATERAL ICA STENOSIS 60-79%  . BPH (benign prostatic hypertrophy)   . Chronic diastolic heart failure ([place])   . Chronic kidney disease    stage 3, pt unaware  . Colon cancer ([place])   . Complication of anesthesia    had nausea after carotid artery surgery, states the medicine given to help the nausea, made it worse  . COPD (chronic obstructive pulmonary disease) ([place])   . Depression   . GERD (gastroesophageal reflux disease)   . Glaucoma BOTH EYES   Dr Gershon Crane  . History of basal cell carcinoma excision   . History of lung cancer APRIL 2012  SQUAMOUS CELL---- S/P RIGHT LOWER LOBECTOMY AT [facility] --  NO CHEMORADIATION---  NO  RECURRENCE    ONCOLOGIST- DR Tressie Stalker  LOV IN [facility] 10-27-2012  . History of pneumothorax    pt unaware  . Hx of adenomatous colonic polyps 2005    X 2; 1 hyperplastic polyp; Dr Olevia Perches  . Hyperlipidemia   . Hypertension   . Impaired fasting glucose 2007   108; A1c5.4%  . Lesion of bladder   . Lung cancer ([place]) 01/25/2012  . Microhematuria   . OSA (obstructive sleep apnea) 08/29/2015   CPAP SET ON 10  . PAD (peripheral artery disease) ([place]) ABI'S  JAN 2014  0.65 ON RIGHT ;  1.04 ON LEFT  . Peripheral vascular disease ([place]) S/P ANGIOPLASTY AND STENTING   FOLLOWED  BY DR [name]  . Pleural effusion on right 11/18/2018   Chronic, noted on CXR  . Spinal stenosis Sept. 2015  . Status post placement of  implantable loop recorder   . Stroke [facility]) Jul 07, 2013   mini  TIA  . Thoracic aorta atherosclerosis ([place])   . Thyrotoxicosis    amiodarone induced  . Urgency of urination     Past Surgical History:  Procedure Laterality Date  . ANGIO PLASTY     X 4 in legs  . AORTOGRAM  07-27-2002   MILD DIFFUSE ILIAC ARTERY OCCLUSIVE DISEASE /  LEFT RENAL ARTERY 20%/ PATENT LEFT FEM-POP GRAFT/ MILD SFA AND POPLITEAL ARTERY OCCLUSIVE DISEASE W/ SEVERE KIDNEY OCCLUSIVE DISEASE  . ATRIAL FIBRILLATION ABLATION N/A 04/09/2019   Procedure: ATRIAL FIBRILLATION ABLATION;  Surgeon: Thompson Grayer, MD;  Location: [place] CV LAB;  Service: Cardiovascular;  Laterality: N/A;  . BASAL CELL CARCINOMA EXCISION     MULTIPLE TIMES--  RIGHT FOREARM, CHEEKS, AND BACK  . BIOPSY  06/25/2018   Procedure: BIOPSY;  Surgeon: Rush Landmark Telford Nab., MD;  Location: [place];  Service: Gastroenterology;;  . CARDIOVASCULAR STRESS TEST  12-08-2012  DR MCLEAN   NORMAL LEXISCAN WITH NO EXERCISE NUCLEAR STUDY/ EF 66%/   NO ISCHEMIA/ NO SIGNIFICANT CHANGE FROM PRIOR STUDY  . CARDIOVERSION N/A 02/14/2018   Procedure: CARDIOVERSION;  Surgeon: Larey Dresser, MD;  Location: [facility] ENDOSCOPY;  Service: Cardiovascular;  Laterality: N/A;  . CAROTID ANGIOGRAM N/A 07/10/2013   Procedure: CAROTID ANGIOGRAM;  Surgeon: Elam Dutch, MD;  Location: [facility] CATH LAB;  Service: Cardiovascular;  Laterality: N/A;  . CAROTID ENDARTERECTOMY Right 07-14-13   cea  . CATARACT EXTRACTION W/ INTRAOCULAR LENS  IMPLANT, BILATERAL    . colon polyectomy    . COLONOSCOPY WITH PROPOFOL N/A 06/25/2018   Procedure: COLONOSCOPY WITH PROPOFOL;  Surgeon: Rush Landmark Telford Nab., MD;  Location: [place];  Service: Gastroenterology;  Laterality: N/A;  . CYSTOSCOPY W/ RETROGRADES Bilateral 01/21/2013   Procedure: CYSTOSCOPY WITH RETROGRADE PYELOGRAM;  Surgeon: Molli Hazard, MD;  Location: [facility];  Service: Urology;  Laterality:  Bilateral;   CYSTO, BLADDER BIOPSY, BILATERAL RETROGRADE PYELOGRAM  RAD TECH FROM RADIOLOGY PER JOY  . CYSTOSCOPY WITH BIOPSY N/A 01/21/2013   Procedure: CYSTOSCOPY WITH BIOPSY;  Surgeon: Molli Hazard, MD;  Location[facility];  Service: Urology;  Laterality: N/A;  . ENDARTERECTOMY Right 07/14/2013   Procedure: ENDARTERECTOMY CAROTID;  Surgeon: Angelia Mould, MD;  Location: [place];  Service: Vascular;  Laterality: Right;  . EP IMPLANTABLE DEVICE N/A 08/12/2015   Procedure: Loop Recorder Insertion;  Surgeon: Thompson Grayer, MD;  Location: [place] CV LAB;  Service: Cardiovascular;  Laterality: N/A;  . ESOPHAGOGASTRODUODENOSCOPY (EGD) WITH PROPOFOL N/A 06/25/2018   Procedure:  ESOPHAGOGASTRODUODENOSCOPY (EGD) WITH PROPOFOL;  Surgeon: Mansouraty, Telford Nab., MD;  Location: [place];  Service: Gastroenterology;  Laterality: N/A;  . ESOPHAGOGASTRODUODENOSCOPY (EGD) WITH PROPOFOL N/A 07/10/2018   Procedure: ESOPHAGOGASTRODUODENOSCOPY (EGD) WITH PROPOFOL;  Surgeon: Milus Banister, MD;  Location: [facility] ENDOSCOPY;  Service: Endoscopy;  Laterality: N/A;  . EUS N/A 07/10/2018   Procedure: UPPER ENDOSCOPIC ULTRASOUND (EUS) RADIAL;  Surgeon: Milus Banister, MD;  Location: [facility] ENDOSCOPY;  Service: Endoscopy;  Laterality: N/A;  . EYE SURGERY Right   . FEMORAL-POPLITEAL BYPASS GRAFT Left [facility]   AND 2001  IN [place]  . FEMORAL-POPLITEAL BYPASS GRAFT Left 08/30/2015   Procedure: REVISION OF BYPASS GRAFT Left  FEMORAL-POPLITEAL ARTERY;  Surgeon: Angelia Mould, MD;  Location: [place];  Service: Vascular;  Laterality: Left;  . FINE NEEDLE ASPIRATION N/A 07/10/2018   Procedure: FINE NEEDLE ASPIRATION (FNA) LINEAR;  Surgeon: Milus Banister, MD;  Location: [facility] ENDOSCOPY;  Service: Endoscopy;  Laterality: N/A;  . FLEXIBLE SIGMOIDOSCOPY N/A 12/12/2018   Procedure: FLEXIBLE SIGMOIDOSCOPY;  Surgeon: Ileana Roup, MD;  Location: [facility] ORS;  Service: General;   Laterality: N/A;  . LARYNGOSCOPY  06-27-2004   BX VOCAL CORD  (LEUKOPLAKIA)  PER PT NO ISSUES SINCE  . LEFT HEART CATH AND CORONARY ANGIOGRAPHY N/A 08/20/2017   Procedure: LEFT HEART CATH AND CORONARY ANGIOGRAPHY;  Surgeon: Larey Dresser, MD;  Location: [place] CV LAB;  Service: Cardiovascular;  Laterality: N/A;  . LOOP RECORDER INSERTION N/A 04/09/2019   Procedure: LOOP RECORDER INSERTION;  Surgeon: Thompson Grayer, MD;  Location: [place] CV LAB;  Service: Cardiovascular;  Laterality: N/A;  . LOOP RECORDER REMOVAL N/A 04/09/2019   Procedure: LOOP RECORDER REMOVAL;  Surgeon: Thompson Grayer, MD;  Location: [place] CV LAB;  Service: Cardiovascular;  Laterality: N/A;  . LOWER EXTREMITY ANGIOGRAM Bilateral 08/29/2015   Procedure: Lower Extremity Angiogram;  Surgeon: Angelia Mould, MD;  Location: [place] CV LAB;  Service: Cardiovascular;  Laterality: Bilateral;  . LUNG LOBECTOMY  01/24/2011    RIGHT UPPER LOBE  (SQUAMOUS CELL CARCINOMA) Dr Dorthea Cove , [facility]. No chemotherapyor radiation  . PATCH ANGIOPLASTY Right 07/14/2013   Procedure: PATCH ANGIOPLASTY;  Surgeon: Angelia Mould, MD;  Location: [place];  Service: Vascular;  Laterality: Right;  . PATCH ANGIOPLASTY Left 08/30/2015   Procedure: VEIN PATCH ANGIOPLASTY OF PROXIMAL Left BYPASS GRAFT;  Surgeon: Angelia Mould, MD;  Location: [place];  Service: Vascular;  Laterality: Left;  . PERIPHERAL VASCULAR CATHETERIZATION N/A 08/29/2015   Procedure: Abdominal Aortogram;  Surgeon: Angelia Mould, MD;  Location: [place] CV LAB;  Service: Cardiovascular;  Laterality: N/A;  . POLYPECTOMY  06/25/2018   Procedure: POLYPECTOMY;  Surgeon: Rush Landmark Telford Nab., MD;  Location: [place];  Service: Gastroenterology;;  . Lia Foyer INJECTION  06/25/2018   Procedure: SUBMUCOSAL INJECTION;  Surgeon: Irving Copas., MD;  Location: [place];  Service: Gastroenterology;;  . TEE WITHOUT CARDIOVERSION N/A 02/14/2018    Procedure: TRANSESOPHAGEAL ECHOCARDIOGRAM (TEE);  Surgeon: Larey Dresser, MD;  Location: [facility] ENDOSCOPY;  Service: Cardiovascular;  Laterality: N/A;  . trabecular surgery     OS  . TRANSTHORACIC ECHOCARDIOGRAM  12-29-2012  DR [facility]   MILD LVH/  LVSF NORMAL/ EF 95-28%/  GRADE I DIASTOLIC DYSFUNCTION    Social History   Socioeconomic History  . Marital status: Married    Spouse name: [name]   . Number of children: 3  . Years of education: 12+  . Highest education  level: Not on file  Occupational History  . 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 Smoking status: Former Smoker    Packs/day: 0.50    Years: 50.00    Pack years: 25.00    Types: Cigarettes    Quit date: 07/28/2014    Years since quitting: 5.3  . Smokeless tobacco: Never Used  Substance and Sexual Activity  . Alcohol use: Yes    Alcohol/week: 0.0 standard drinks    Comment:  socially, variable  . Drug use: No  . Sexual activity: Not on file  Other Topics Concern  . Not on file  Social History Narrative   Patient lives at home with spouse [name]   Patient has 3 children    Patient is right handed    Social Determinants of Health   Financial Resource Strain:   . Difficulty of Paying Living Expenses: Not on file  Food Insecurity:   . Worried About Charity fundraiser in the Last Year: Not on file  . Ran Out of Food in the Last Year: Not on file  Transportation Needs:   . Lack of Transportation (Medical): Not on file  . Lack of Transportation (Non-Medical): Not on file  Physical Activity:   . Days of Exercise per Week: Not on file  . Minutes of Exercise per Session: Not on file  Stress:   . Feeling of Stress : Not on file  Social Connections:   . Frequency of Communication with Friends and Family: Not on file  . Frequency of Social Gatherings with Friends and Family: Not on  file  . Attends Religious Services: Not on file  . Active Member of Clubs or Organizations: Not on file  . Attends Archivist Meetings: Not on file  . Marital Status: Not on file  Intimate Partner Violence:   . Fear of Current or Ex-Partner: Not on file  . Emotionally Abused: Not on file  . Physically Abused: Not on file  . Sexually Abused: Not on file    Family History  Problem Relation Age of Onset  . Stroke Mother        mini strokes  . Alcohol abuse Father   . Heart disease Father        MI after 107  . Stroke Father   . Hypertension Father   . Heart attack Father   . Heart disease Paternal Aunt        several  . Hypertension Paternal Aunt        several  . Stroke Paternal Aunt        several  . Stroke Paternal Uncle        several  . Heart disease Paternal Uncle        several;2 had MI pre 59  . Cancer Daughter 9       breast ca, also with benign sessile polyp     Current Outpatient Medications  Medication Sig Dispense Refill  . acetaminophen (TYLENOL) 325 MG tablet Take 325 mg by mouth every 6 (six) hours as needed for moderate pain or headache.     [name] amLODipine (NORVASC) 5 MG tablet TAKE 1 TABLET ONCE DAILY. 28 tablet 0  . Ascorbic Acid (VITAMIN C PO) Take 1 tablet by mouth daily.    . bimatoprost (LUMIGAN) 0.01 % SOLN Place 1 drop at bedtime  into both eyes.     [name] docusate sodium (COLACE) 100 MG capsule Take 100 mg by mouth at bedtime.     . dorzolamide-timolol (COSOPT) 22.3-6.8 MG/ML ophthalmic solution Place 1 drop into both eyes 2 (two) times daily.     [name] ELIQUIS 5 MG TABS tablet TAKE 1 TABLET BY MOUTH TWICE DAILY. 56 tablet 0  . ferrous sulfate 325 (65 FE) MG tablet Take 325 mg by mouth at bedtime.     [name] FLUoxetine (PROZAC) 40 MG capsule Take 1 capsule (40 mg total) by mouth daily. 90 capsule 3  . furosemide (LASIX) 20 MG tablet TAKE 2 TABLETS EACH MORNING. 56 tablet 0  . Magnesium 500 MG CAPS Take 500 mg by mouth daily.     . Melatonin 5 MG TABS  Take 5 mg by mouth at bedtime.    . metoprolol succinate (TOPROL-XL) 25 MG 24 hr tablet Take 0.5 tablets (12.5 mg total) by mouth at bedtime. Take with or immediately following a meal. 45 tablet 1  . montelukast (SINGULAIR) 10 MG tablet TAKE ONE TABLET AT BEDTIME. 30 tablet 5  . Multiple Vitamins-Minerals (CENTRUM SILVER PO) Take 1 tablet by mouth daily.    . Multiple Vitamins-Minerals (ZINC PO) Take 1 tablet by mouth daily.    [name] omeprazole (PRILOSEC) 20 MG capsule Take 30- 60 min before your first and last meals of the day    . Polyethyl Glycol-Propyl Glycol (SYSTANE) 0.4-0.3 % SOLN Place 1 drop into both eyes daily as needed (for dry eyes).     . potassium chloride SA (KLOR-CON) 20 MEQ tablet TAKE 1 TABLET ONCE DAILY. 28 tablet 0  . rosuvastatin (CRESTOR) 40 MG tablet TAKE 1 TABLET BY MOUTH DAILY. 90 tablet 0  . senna (SENOKOT) 8.6 MG tablet Take 1 tablet by mouth 2 (two) times daily.    . SYMBICORT 160-4.5 MCG/ACT inhaler USE 2 PUFFS TWICE DAILY. 10.2 g 5   No current facility-administered medications for this visit.    Allergies  Allergen Reactions  . Niacin-Lovastatin Er Shortness Of Breath and Other (See Comments)    Dyspnea, flushing  . Penicillins Hives, Shortness Of Breath and Other (See Comments)    Flushing  & dyspnea Because of a history of documented adverse serious drug reaction;Medi Alert bracelet  is recommended PCN reaction causing immediate rash, facial/tongue/throat swelling, SOB or lightheadedness with hypotension: Yes PCN reaction causing severe rash involving mucus membranes or skin necrosis: No PCN reaction occurring within the last 10 years: NO PCN reaction that required hospitalization: NO  . Sulfonamide Derivatives Hives, Shortness Of Breath and Other (See Comments)    Flushing & dyspnea Because of a history of documented adverse serious drug reaction;Medi Alert bracelet  is recommended  . Atorvastatin Other (See Comments)    Myalgias & athralgias- takes  Crestor, DOES NOT LIKE LIPITOR     REVIEW OF SYSTEMS:  Review of Systems  Constitutional: Negative for chills and fever.  HENT: Positive for hearing loss. Negative for congestion, sore throat and tinnitus.   Eyes: Positive for blurred vision.  Respiratory: Negative for cough, shortness of breath and wheezing.   Cardiovascular: Negative for chest pain, palpitations and leg swelling.  Gastrointestinal: Negative for abdominal pain, constipation, diarrhea, nausea and vomiting.  Genitourinary: Negative for dysuria.  Musculoskeletal: Negative for joint pain.    PHYSICAL EXAMINATION:  Vitals:   11/18/19 1311 11/18/19 1314  BP: (!) 157/73 (!) 144/75  Pulse: 67 71  Temp: 97.9 F (36.6 C)  SpO2: 93%   Weight: 199 lb 12.8 oz (90.6 kg)   Height: 6\' 1"  (1.854 m)     General:  Well appearing, not in any acute distress Gait: shuffling gate HENT: WNL, normocephalic Pulmonary: normal non-labored breathing , without Rales, rhonchi,  wheezing Cardiac: regular HR, without  Murmurs without carotid bruit Abdomen: soft, NT, no masses Skin: without rashes Vascular Exam/Pulses:  Right Left  Radial 2+ (normal) 2+ (normal)  Ulnar 2+ (normal) 2+ (normal)  Femoral 2+ (normal) 2+ (normal)  Popliteal 2+ (normal) Palpable pulse in left lower extremity bypass  DP Not palpable Not palpable  PT Not palpable Not palpable   Extremities: without ischemic changes, without Gangrene , without cellulitis; without open wounds; right and left feet warm. Normal motor and sensation Musculoskeletal: no muscle wasting or atrophy, no pain to palpation of left thigh, left hip or greater trochanter, no positive straight leg raise, no lumbar tenderness  Neurologic: A&O X 3;  No focal weakness or paresthesias are detected Psychiatric:  The pt has Normal affect.   Non-Invasive Vascular Imaging:   R ABI: 0.54, TBI: 0.47 Monophasic L ABI: 1.02, TBI: 0.98 Triphasic   ASSESSMENT/PLAN:: 79 y.o. male here for follow up  for his peripheral artery disease. He is s/p left fem-popliteal bypass in 1994 with subsequent angioplasty in 2016 by Dr. [name]. He is not having any claudication symptoms, rest pain, or any non healing wounds. He has been having LLE>RLE pain that is radiating from his low back that does not clinically seem to be related to his arterial disease and I suspect is more neuropathic in nature. It is presently not limiting his activities of daily living. I encourage him to continue his activity. He will continue his Eliquis and Crestor. He will follow up in 1 year with an arterial duplex of his left fem- pop bypass and ABI.  Right Carotid endarterectomy in 2014. He remains asymptomatic without any TIA or stroke like symptoms. He will have follow up carotid duplex in 1 year.  We are continuing to observe his AAA with follow up every 2-3 years with duplex. He will be due for his follow up at next visit in 1 year.  I discussed concerning signs and symptoms of stroke and PAD with the patient and instructed him to call for earlier follow up or to go to the Emergency room  Karoline Caldwell, PA-C [facility] [phone]  Clinic MD:  Dr. [name]

## 2019-11-17 ENCOUNTER — Other Ambulatory Visit: Payer: Self-pay

## 2019-11-17 ENCOUNTER — Telehealth (HOSPITAL_COMMUNITY): Payer: Self-pay

## 2019-11-17 DIAGNOSIS — I739 Peripheral vascular disease, unspecified: Secondary | ICD-10-CM

## 2019-11-17 NOTE — Telephone Encounter (Signed)

## 2019-11-18 ENCOUNTER — Ambulatory Visit (HOSPITAL_COMMUNITY)
Admission: RE | Admit: 2019-11-18 | Discharge: 2019-11-18 | Disposition: A | Discharge status: Home / Self Care | Payer: PPO | Source: Ambulatory Visit | Attending: Vascular Surgery | Admitting: Vascular Surgery

## 2019-11-18 ENCOUNTER — Ambulatory Visit: Payer: PPO | Admitting: Psychiatry

## 2019-11-18 ENCOUNTER — Ambulatory Visit (INDEPENDENT_AMBULATORY_CARE_PROVIDER_SITE_OTHER)
Admission: RE | Admit: 2019-11-18 | Discharge: 2019-11-18 | Disposition: A | Discharge status: Home / Self Care | Payer: PPO | Source: Ambulatory Visit | Attending: Vascular Surgery | Admitting: Vascular Surgery

## 2019-11-18 ENCOUNTER — Ambulatory Visit (INDEPENDENT_AMBULATORY_CARE_PROVIDER_SITE_OTHER): Payer: PPO | Admitting: Physician Assistant

## 2019-11-18 ENCOUNTER — Other Ambulatory Visit: Payer: Self-pay

## 2019-11-18 VITALS — BP 144/75 | HR 71 | Temp 97.9°F | Ht 73.0 in | Wt 199.8 lb

## 2019-11-18 DIAGNOSIS — I714 Abdominal aortic aneurysm, without rupture, unspecified: Secondary | ICD-10-CM

## 2019-11-18 DIAGNOSIS — I739 Peripheral vascular disease, unspecified: Secondary | ICD-10-CM

## 2019-11-18 DIAGNOSIS — I6523 Occlusion and stenosis of bilateral carotid arteries: Secondary | ICD-10-CM | POA: Diagnosis not present

## 2019-11-20 ENCOUNTER — Other Ambulatory Visit: Payer: Self-pay | Admitting: *Deleted

## 2019-11-20 ENCOUNTER — Telehealth: Payer: Self-pay | Admitting: Internal Medicine

## 2019-11-20 ENCOUNTER — Ambulatory Visit: Payer: PPO | Admitting: Gastroenterology

## 2019-11-20 DIAGNOSIS — I739 Peripheral vascular disease, unspecified: Secondary | ICD-10-CM

## 2019-11-20 DIAGNOSIS — I714 Abdominal aortic aneurysm, without rupture, unspecified: Secondary | ICD-10-CM

## 2019-11-20 DIAGNOSIS — I6523 Occlusion and stenosis of bilateral carotid arteries: Secondary | ICD-10-CM

## 2019-11-20 NOTE — Telephone Encounter (Signed)
Spoke with pt spouse, assisted her with sending manual transmission from [place], results reviewed with EM, RN.  SR with PACs, no true AF noted.    Pt spouse reports pt symptoms starting yesterday morning include weakness, nausea and vertigo.  Pt BP yesterday morning was 173/67 HR 73.  Pt did not take any of his morning meds due to nausea.  In the afternoon his BP was 167-70 HR 63.  At time of call spouse unable to provide if pt still symptomatic as he is just waking up.    Advised pt symptoms not necessarily caused by Hr and if he continues with symptoms especially nausea, consult with PCP.  Encouraged medication compliance.

## 2019-11-20 NOTE — Telephone Encounter (Signed)
New Message    Pts wife is calling and would like for Troy [name] to call her  She thinks the pt is in Afib   Please call

## 2019-11-20 NOTE — Telephone Encounter (Signed)
Call returned to Pt's wife.  Per wife Pt is having strange symptoms that normally mean he is in afib (she did not state what these were).  She went on to say she would like [name] to check.  Pt has loop recorder.  Advised wife we would follow up with her.

## 2019-11-23 ENCOUNTER — Emergency Department (HOSPITAL_COMMUNITY)
Admission: EM | Admit: 2019-11-23 | Discharge: 2019-11-23 | Disposition: A | Discharge status: Home / Self Care | Payer: PPO | Attending: Emergency Medicine | Admitting: Emergency Medicine

## 2019-11-23 ENCOUNTER — Emergency Department (HOSPITAL_COMMUNITY): Payer: PPO

## 2019-11-23 ENCOUNTER — Encounter (HOSPITAL_COMMUNITY): Payer: Self-pay | Admitting: Emergency Medicine

## 2019-11-23 ENCOUNTER — Telehealth: Payer: Self-pay

## 2019-11-23 ENCOUNTER — Other Ambulatory Visit: Payer: Self-pay

## 2019-11-23 DIAGNOSIS — I130 Hypertensive heart and chronic kidney disease with heart failure and stage 1 through stage 4 chronic kidney disease, or unspecified chronic kidney disease: Secondary | ICD-10-CM | POA: Insufficient documentation

## 2019-11-23 DIAGNOSIS — R030 Elevated blood-pressure reading, without diagnosis of hypertension: Secondary | ICD-10-CM | POA: Diagnosis not present

## 2019-11-23 DIAGNOSIS — I10 Essential (primary) hypertension: Secondary | ICD-10-CM

## 2019-11-23 DIAGNOSIS — Z7901 Long term (current) use of anticoagulants: Secondary | ICD-10-CM | POA: Insufficient documentation

## 2019-11-23 DIAGNOSIS — J449 Chronic obstructive pulmonary disease, unspecified: Secondary | ICD-10-CM | POA: Insufficient documentation

## 2019-11-23 DIAGNOSIS — Z87891 Personal history of nicotine dependence: Secondary | ICD-10-CM | POA: Diagnosis not present

## 2019-11-23 DIAGNOSIS — R42 Dizziness and giddiness: Secondary | ICD-10-CM | POA: Diagnosis not present

## 2019-11-23 DIAGNOSIS — Z79899 Other long term (current) drug therapy: Secondary | ICD-10-CM | POA: Insufficient documentation

## 2019-11-23 DIAGNOSIS — I5032 Chronic diastolic (congestive) heart failure: Secondary | ICD-10-CM | POA: Insufficient documentation

## 2019-11-23 DIAGNOSIS — N183 Chronic kidney disease, stage 3 unspecified: Secondary | ICD-10-CM | POA: Diagnosis not present

## 2019-11-23 LAB — CBC
HCT: 43.6 % (ref 39.0–52.0)
Hemoglobin: 14 g/dL (ref 13.0–17.0)
MCH: 30.1 pg (ref 26.0–34.0)
MCHC: 32.1 g/dL (ref 30.0–36.0)
MCV: 93.8 fL (ref 80.0–100.0)
Platelets: 296 10*3/uL (ref 150–400)
RBC: 4.65 MIL/uL (ref 4.22–5.81)
RDW: 14.6 % (ref 11.5–15.5)
WBC: 5.4 10*3/uL (ref 4.0–10.5)
nRBC: 0 % (ref 0.0–0.2)

## 2019-11-23 LAB — BASIC METABOLIC PANEL
Anion gap: 9 (ref 5–15)
BUN: 16 mg/dL (ref 8–23)
CO2: 26 mmol/L (ref 22–32)
Calcium: 9.2 mg/dL (ref 8.9–10.3)
Chloride: 103 mmol/L (ref 98–111)
Creatinine, Ser: 1.09 mg/dL (ref 0.61–1.24)
GFR calc Af Amer: >60 mL/min (ref >60)
GFR calc non Af Amer: >60 mL/min (ref >60)
Glucose, Bld: 164 mg/dL — ABNORMAL HIGH (ref 70–99)
Potassium: 3.8 mmol/L (ref 3.5–5.1)
Sodium: 138 mmol/L (ref 135–145)

## 2019-11-23 LAB — CBG MONITORING, ED: Glucose-Capillary: 101 mg/dL — ABNORMAL HIGH (ref 70–99)

## 2019-11-23 LAB — URINALYSIS, ROUTINE W REFLEX MICROSCOPIC
Bilirubin Urine: NEGATIVE
Glucose, UA: NEGATIVE mg/dL
Hgb urine dipstick: NEGATIVE
Ketones, ur: NEGATIVE mg/dL
Leukocytes,Ua: NEGATIVE
Nitrite: NEGATIVE
Protein, ur: NEGATIVE mg/dL
Specific Gravity, Urine: 1.011 (ref 1.005–1.030)
pH: 5 (ref 5.0–8.0)

## 2019-11-23 MED ORDER — SODIUM CHLORIDE 0.9% FLUSH: 3.0000 mL | Freq: Once | INTRAVENOUS | Status: DC

## 2019-11-23 MED ORDER — MECLIZINE HCL 12.5 MG PO TABS: 12.5000 mg | ORAL_TABLET | Freq: Three times a day (TID) | ORAL | 0 refills | Status: DC | PRN

## 2019-11-23 NOTE — ED Notes (Signed)
Patient verbalizes understanding of discharge instructions. Opportunity for questioning and answers were provided. Armband removed by staff, pt discharged from ED.  

## 2019-11-23 NOTE — Telephone Encounter (Signed)
Dr Rush Landmark would like for this pt to be scheduled as a direct colonoscopy. Dr Rush Landmark reviewed pt's chart last week as the pt missed his OV appointment last week. I will send to get clearance for Eliquis to be held prior to procedure. Will you please call pt and cancel office visit (April 2021) and schedule pt for pre-visit with Nurse.

## 2019-11-23 NOTE — Telephone Encounter (Signed)
Patient with diagnosis of afib on Eliquis for anticoagulation.    Procedure: Colonoscopy  Date of procedure: TBD  CHADS2-VASc score of  7 (CHF, HTN, AGE, stroke/tia x 2, CAD, AGE)  CrCl 68 ml/min  Due to patients hx of TIA he is high risk off anticoagulation. Would recommend patient hold Eliquis only 1 day prior to procedure. If requesting provider still wants patient to hold 2 days, will need to defer to [name].

## 2019-11-23 NOTE — Telephone Encounter (Signed)
-----   Message from Irving Copas., MD sent at 11/23/2019  7:33 AM EST ----- Regarding: RE: [facility] patient Julie Paolini, Let me know what goes on with getting the patient scheduled for his surveillance colonoscopy for history of colon cancer. Thanks. GM ----- Message ----- From: Ladell Pier, MD Sent: 11/22/2019   2:20 PM EST To: Irving Copas., MD Subject: RE: [facility] patient                             thanks ----- Message ----- From: Irving Copas., MD Sent: 11/20/2019   7:06 PM EST To: Ladell Pier, MD, Binnie Rail, MD Subject: [facility] patient                                 Dr. Quay Burow and Dr. Vevelyn Royals wanted to let you all know that the patient did not show up for his follow-up in clinic so that we could move forward with scheduling his surveillance colonoscopy.I am actually okay without necessarily seeing the patient so we will try to reach out to him again and if he is okay with just coming in for direct procedure colonoscopy if we get the okay in regards to his anticoagulation then I will do that so that we can make sure he gets his surveillance procedure.Just wanted to let you all know.GM

## 2019-11-23 NOTE — Telephone Encounter (Signed)
Request for surgical clearance:     Endoscopy Procedure  What type of surgery is being performed?     Colonoscopy   When is this surgery scheduled?     TBD   What type of clearance is required ?   Pharmacy  Are there any medications that need to be held prior to surgery and how long? Eliquis x 2 days prior to procedure.   Practice name and name of physician performing surgery?      [facility]  What is your office phone and fax number?      Phone- [phone]  [phone]  Anesthesia type (None, local, MAC, general) ?       MAC

## 2019-11-23 NOTE — Telephone Encounter (Signed)
Pt c/o BP issue: STAT if pt c/o blurred vision, one-sided weakness or slurred speech  1. What are your last 5 BP readings?  02-22: 180/73  02-21: 170/75 02-20: 173/76 02-19: 175/82   2. Are you having any other symptoms (ex. Dizziness, headache, blurred vision, passed out)? Vision trouble, dizziness, headache, SOB, fatigue  3. What is your BP issue? High BP   Wife called last week because she thought he was in Afib, but that was not the case. She is concerned because this is the 5th day in a row of him not feeling well.  Vision trouble,

## 2019-11-23 NOTE — ED Notes (Signed)
Wife- lacy [phone] would like a call because she thinks he needs to have someone with him while he is here

## 2019-11-23 NOTE — Telephone Encounter (Signed)
Called Troy [name] but he did not answer. I left a voicemail to call back.

## 2019-11-23 NOTE — Telephone Encounter (Signed)
Pharm please address eliquis thanks 

## 2019-11-23 NOTE — ED Provider Notes (Signed)
[facility] EMERGENCY DEPARTMENT Provider Note   CSN: [id number] Arrival date & time: 11/23/19  1459     History Chief Complaint  Patient presents with  . Dizziness  . Hypertension    Trasean Delima [name] is a 79 y.o. male.  The history is provided by the patient and medical records.  Dizziness Quality:  Vertigo Severity:  Moderate Onset quality:  Sudden Duration:  5 days Timing:  Intermittent Progression:  Resolved Chronicity:  New Context: head movement   Context: not with loss of consciousness, not with physical activity and not when standing up   Context comment:  Lying down and turning to the right only Relieved by:  Being still Exacerbated by: lying down and turning to the right. Associated symptoms: nausea   Associated symptoms: no chest pain, no diarrhea, no headaches, no hearing loss, no palpitations, no shortness of breath, no syncope, no vision changes and no weakness   Risk factors: no hx of vertigo        Past Medical History:  Diagnosis Date  . AAA (abdominal aortic aneurysm) ([place]) LAST ABDOMINAL US 10-20-17 3.3 CM   MONITORED BY DR [name]  . Anxiety   . Arthritis   . Atrial fibrillation ([place])   . Basal cell carcinoma   . Bilateral carotid artery stenosis DUPLEX 12-29-2012  BY DR [facility]   BILATERAL ICA STENOSIS 60-79%  . BPH (benign prostatic hypertrophy)   . Chronic diastolic heart failure ([place])   . Chronic kidney disease    stage 3, pt unaware  . Colon cancer ([place])   . Complication of anesthesia    had nausea after carotid artery surgery, states the medicine given to help the nausea, made it worse  . COPD (chronic obstructive pulmonary disease) ([place])   . Depression   . GERD (gastroesophageal reflux disease)   . Glaucoma BOTH EYES   Dr Gershon Crane  . History of basal cell carcinoma excision   . History of lung cancer APRIL 2012  SQUAMOUS CELL---- S/P RIGHT LOWER LOBECTOMY AT [facility] --  NO CHEMORADIATION---  NO RECURRENCE    ONCOLOGIST- DR Tressie Stalker  LOV IN [facility] 10-27-2012  . History of pneumothorax    pt unaware  . Hx of adenomatous colonic polyps 2005    X 2; 1 hyperplastic polyp; Dr Olevia Perches  . Hyperlipidemia   . Hypertension   . Impaired fasting glucose 2007   108; A1c5.4%  . Lesion of bladder   . Lung cancer ([place]) 01/25/2012  . Microhematuria   . OSA (obstructive sleep apnea) 08/29/2015   CPAP SET ON 10  . PAD (peripheral artery disease) ([place]) ABI'S  JAN 2014  0.65 ON RIGHT ;  1.04 ON LEFT  . Peripheral vascular disease ([place]) S/P ANGIOPLASTY AND STENTING   FOLLOWED  BY DR [name]  . Pleural effusion on right 11/18/2018   Chronic, noted on CXR  . Spinal stenosis Sept. 2015  . Status post placement of implantable loop recorder   . Stroke [facility]) Jul 07, 2013   mini  TIA  . Thoracic aorta atherosclerosis ([place])   . Thyrotoxicosis    amiodarone induced  . Urgency of urination     Patient Active Problem List   Diagnosis Date Noted  . Cancer of sigmoid colon ([place]) 12/12/2018  . Pleural effusion on right 07/15/2018  . Disease of lymph node   . Colon adenocarcinoma ([place]) 06/30/2018  . Acute GI bleeding 06/23/2018  . Depression 05/06/2018  . Syncope 01/29/2018  .  Chronic diastolic CHF (congestive heart failure) ([place]) 05/04/2017  . CAD (coronary artery disease) 05/03/2017  . MGUS (monoclonal gammopathy of unknown significance) 03/07/2016  . Benign essential HTN 01/19/2016  . OSA (obstructive sleep apnea) 08/29/2015  . PAF (paroxysmal atrial fibrillation) ([place])   . Glaucoma 06/22/2015  . DOE (dyspnea on exertion) 02/04/2015  . Memory loss, short term 08/03/2014  . Anemia, unspecified 12/28/2013  . Occlusion and stenosis of carotid artery without mention of cerebral infarction 07/09/2013  . COPD GOLD II  07/09/2013  . Chronic renal disease, stage 3, moderately decreased glomerular filtration rate between 30-59 mL/min/1.73 square meter (HCC) 07/06/2013  . TIA (transient ischemic attack) 07/06/2013    . Basal cell carcinoma of skin 10/10/2012  . History of lung cancer 03/11/2012  . PAD (peripheral artery disease) ([place]) 12/16/2011  . AAA (abdominal aortic aneurysm) ([place]) 09/27/2011  . Squamous cell lung cancer ([place]) 11/08/2010  . GAIT IMBALANCE 03/02/2009  . Prediabetes 03/02/2009  . COLONIC POLYPS, HX OF 03/02/2009  . OBSTRUCTIVE CHRONIC BRONCHITIS WITHOUT EXACERBAT 01/15/2008  . HYPERPLASIA PROSTATE UNS W/O UR OBST & OTH LUTS 01/15/2008  . HYPERLIPIDEMIA 09/29/2007  . Peripheral vascular disease ([place]) 09/29/2007  . LEUKOPLAKIA, VOCAL CORDS 09/29/2007  . FEMORAL BRUIT 09/29/2007    Past Surgical History:  Procedure Laterality Date  . ANGIO PLASTY     X 4 in legs  . AORTOGRAM  07-27-2002   MILD DIFFUSE ILIAC ARTERY OCCLUSIVE DISEASE /  LEFT RENAL ARTERY 20%/ PATENT LEFT FEM-POP GRAFT/ MILD SFA AND POPLITEAL ARTERY OCCLUSIVE DISEASE W/ SEVERE KIDNEY OCCLUSIVE DISEASE  . ATRIAL FIBRILLATION ABLATION N/A 04/09/2019   Procedure: ATRIAL FIBRILLATION ABLATION;  Surgeon: Thompson Grayer, MD;  Location: [place] CV LAB;  Service: Cardiovascular;  Laterality: N/A;  . BASAL CELL CARCINOMA EXCISION     MULTIPLE TIMES--  RIGHT FOREARM, CHEEKS, AND BACK  . BIOPSY  06/25/2018   Procedure: BIOPSY;  Surgeon: Rush Landmark Telford Nab., MD;  Location: [facility];  Service: Gastroenterology;;  . CARDIOVASCULAR STRESS TEST  12-08-2012  DR MCLEAN   NORMAL LEXISCAN WITH NO EXERCISE NUCLEAR STUDY/ EF 66%/   NO ISCHEMIA/ NO SIGNIFICANT CHANGE FROM PRIOR STUDY  . CARDIOVERSION N/A 02/14/2018   Procedure: CARDIOVERSION;  Surgeon: Larey Dresser, MD;  Location: [facility] ENDOSCOPY;  Service: Cardiovascular;  Laterality: N/A;  . CAROTID ANGIOGRAM N/A 07/10/2013   Procedure: CAROTID ANGIOGRAM;  Surgeon: Elam Dutch, MD;  Location: [facility] CATH LAB;  Service: Cardiovascular;  Laterality: N/A;  . CAROTID ENDARTERECTOMY Right 07-14-13   cea  . CATARACT EXTRACTION W/ INTRAOCULAR LENS  IMPLANT, BILATERAL    . colon  polyectomy    . COLONOSCOPY WITH PROPOFOL N/A 06/25/2018   Procedure: COLONOSCOPY WITH PROPOFOL;  Surgeon: Rush Landmark Telford Nab., MD;  Location: [facility];  Service: Gastroenterology;  Laterality: N/A;  . CYSTOSCOPY W/ RETROGRADES Bilateral 01/21/2013   Procedure: CYSTOSCOPY WITH RETROGRADE PYELOGRAM;  Surgeon: Molli Hazard, MD;  Location: [facility];  Service: Urology;  Laterality: Bilateral;   CYSTO, BLADDER BIOPSY, BILATERAL RETROGRADE PYELOGRAM  RAD TECH FROM RADIOLOGY PER JOY  . CYSTOSCOPY WITH BIOPSY N/A 01/21/2013   Procedure: CYSTOSCOPY WITH BIOPSY;  Surgeon: Molli Hazard, MD;  Location: [facility];  Service: Urology;  Laterality: N/A;  . ENDARTERECTOMY Right 07/14/2013   Procedure: ENDARTERECTOMY CAROTID;  Surgeon: Angelia Mould, MD;  Location: [facility];  Service: Vascular;  Laterality: Right;  . EP IMPLANTABLE DEVICE N/A 08/12/2015   Procedure: Loop Recorder Insertion;  Surgeon: Thompson Grayer, MD;  Location: [place] CV LAB;  Service: Cardiovascular;  Laterality: N/A;  . ESOPHAGOGASTRODUODENOSCOPY (EGD) WITH PROPOFOL N/A 06/25/2018   Procedure: ESOPHAGOGASTRODUODENOSCOPY (EGD) WITH PROPOFOL;  Surgeon: Rush Landmark Telford Nab., MD;  Location: [facility];  Service: Gastroenterology;  Laterality: N/A;  . ESOPHAGOGASTRODUODENOSCOPY (EGD) WITH PROPOFOL N/A 07/10/2018   Procedure: ESOPHAGOGASTRODUODENOSCOPY (EGD) WITH PROPOFOL;  Surgeon: Milus Banister, MD;  Location: [facility] ENDOSCOPY;  Service: Endoscopy;  Laterality: N/A;  . EUS N/A 07/10/2018   Procedure: UPPER ENDOSCOPIC ULTRASOUND (EUS) RADIAL;  Surgeon: Milus Banister, MD;  Location: [facility] ENDOSCOPY;  Service: Endoscopy;  Laterality: N/A;  . EYE SURGERY Right   . FEMORAL-POPLITEAL BYPASS GRAFT Left [facility]   AND 2001  IN [place]  . FEMORAL-POPLITEAL BYPASS GRAFT Left 08/30/2015   Procedure: REVISION OF BYPASS GRAFT Left  FEMORAL-POPLITEAL ARTERY;  Surgeon: Angelia Mould, MD;  Location: [facility];  Service: Vascular;  Laterality: Left;  . FINE NEEDLE ASPIRATION N/A 07/10/2018   Procedure: FINE NEEDLE ASPIRATION (FNA) LINEAR;  Surgeon: Milus Banister, MD;  Location: [facility] ENDOSCOPY;  Service: Endoscopy;  Laterality: N/A;  . FLEXIBLE SIGMOIDOSCOPY N/A 12/12/2018   Procedure: FLEXIBLE SIGMOIDOSCOPY;  Surgeon: Ileana Roup, MD;  Location: [facility] ORS;  Service: General;  Laterality: N/A;  . LARYNGOSCOPY  06-27-2004   BX VOCAL CORD  (LEUKOPLAKIA)  PER PT NO ISSUES SINCE  . LEFT HEART CATH AND CORONARY ANGIOGRAPHY N/A 08/20/2017   Procedure: LEFT HEART CATH AND CORONARY ANGIOGRAPHY;  Surgeon: Larey Dresser, MD;  Location: [place] CV LAB;  Service: Cardiovascular;  Laterality: N/A;  . LOOP RECORDER INSERTION N/A 04/09/2019   Procedure: LOOP RECORDER INSERTION;  Surgeon: Thompson Grayer, MD;  Location: [place] CV LAB;  Service: Cardiovascular;  Laterality: N/A;  . LOOP RECORDER REMOVAL N/A 04/09/2019   Procedure: LOOP RECORDER REMOVAL;  Surgeon: Thompson Grayer, MD;  Location: [place] CV LAB;  Service: Cardiovascular;  Laterality: N/A;  . LOWER EXTREMITY ANGIOGRAM Bilateral 08/29/2015   Procedure: Lower Extremity Angiogram;  Surgeon: Angelia Mould, MD;  Location: [place] CV LAB;  Service: Cardiovascular;  Laterality: Bilateral;  . LUNG LOBECTOMY  01/24/2011    RIGHT UPPER LOBE  (SQUAMOUS CELL CARCINOMA) Dr Dorthea Cove , [facility]. No chemotherapyor radiation  . PATCH ANGIOPLASTY Right 07/14/2013   Procedure: PATCH ANGIOPLASTY;  Surgeon: Angelia Mould, MD;  Location: [place] [facility];  Service: Vascular;  Laterality: Right;  . PATCH ANGIOPLASTY Left 08/30/2015   Procedure: VEIN PATCH ANGIOPLASTY OF PROXIMAL Left BYPASS GRAFT;  Surgeon: Angelia Mould, MD;  Location: [place];  Service: Vascular;  Laterality: Left;  . PERIPHERAL VASCULAR CATHETERIZATION N/A 08/29/2015   Procedure: Abdominal Aortogram;  Surgeon: Angelia Mould, MD;  Location: [place] CV LAB;  Service: Cardiovascular;  Laterality: N/A;  . POLYPECTOMY  06/25/2018   Procedure: POLYPECTOMY;  Surgeon: Rush Landmark Telford Nab., MD;  Location: [place];  Service: Gastroenterology;;  . Lia Foyer INJECTION  06/25/2018   Procedure: SUBMUCOSAL INJECTION;  Surgeon: Irving Copas., MD;  Location: [facility];  Service: Gastroenterology;;  . TEE WITHOUT CARDIOVERSION N/A 02/14/2018   Procedure: TRANSESOPHAGEAL ECHOCARDIOGRAM (TEE);  Surgeon: Larey Dresser, MD;  Location: [facility] ENDOSCOPY;  Service: Cardiovascular;  Laterality: N/A;  . trabecular surgery     OS  . TRANSTHORACIC ECHOCARDIOGRAM  12-29-2012  DR [facility]   MILD LVH/  LVSF NORMAL/ EF 01-77%/  GRADE I DIASTOLIC DYSFUNCTION       Family History  Problem  Relation Age of Onset  . Stroke Mother        mini strokes  . Alcohol abuse Father   . Heart disease Father        MI after 52  . Stroke Father   . Hypertension Father   . Heart attack Father   . Heart disease Paternal Aunt        several  . Hypertension Paternal Aunt        several  . Stroke Paternal Aunt        several  . Stroke Paternal Uncle        several  . Heart disease Paternal Uncle        several;2 had MI pre 37  . Cancer Daughter 77       breast ca, also with benign sessile polyp     Social History   Tobacco Use  . Smoking status: Former Smoker    Packs/day: 0.50    Years: 50.00    Pack years: 25.00    Types: Cigarettes    Quit date: 07/28/2014    Years since quitting: 5.3  . Smokeless tobacco: Never Used  Substance Use Topics  . Alcohol use: Yes    Alcohol/week: 0.0 standard drinks    Comment:  socially, variable  . Drug use: No    Home Medications Prior to Admission medications   Medication Sig Start Date End Date Taking? Authorizing Provider  acetaminophen (TYLENOL) 325 MG tablet Take 325 mg by mouth every 6 (six) hours as needed for moderate pain or headache.     [provider]  amLODipine (NORVASC)  5 MG tablet TAKE 1 TABLET ONCE DAILY. 10/27/19   Larey Dresser, MD  Ascorbic Acid (VITAMIN C PO) Take 1 tablet by mouth daily.    [provider]  bimatoprost (LUMIGAN) 0.01 % SOLN Place 1 drop at bedtime into both eyes.     [provider]  docusate sodium (COLACE) 100 MG capsule Take 100 mg by mouth at bedtime.     [provider]  dorzolamide-timolol (COSOPT) 22.3-6.8 MG/ML ophthalmic solution Place 1 drop into both eyes 2 (two) times daily.     [provider]  ELIQUIS 5 MG TABS tablet TAKE 1 TABLET BY MOUTH TWICE DAILY. 10/27/19   Larey Dresser, MD  ferrous sulfate 325 (65 FE) MG tablet Take 325 mg by mouth at bedtime.     [provider]  FLUoxetine (PROZAC) 40 MG capsule Take 1 capsule (40 mg total) by mouth daily. 07/09/19   Binnie Rail, MD  furosemide (LASIX) 20 MG tablet TAKE 2 TABLETS EACH MORNING. 10/27/19   Larey Dresser, MD  Magnesium 500 MG CAPS Take 500 mg by mouth daily.     [provider]  Melatonin 5 MG TABS Take 5 mg by mouth at bedtime.    [provider]  metoprolol succinate (TOPROL-XL) 25 MG 24 hr tablet Take 0.5 tablets (12.5 mg total) by mouth at bedtime. Take with or immediately following a meal. 06/18/19   Larey Dresser, MD  montelukast (SINGULAIR) 10 MG tablet TAKE ONE TABLET AT BEDTIME. 09/08/19   Tanda Rockers, MD  Multiple Vitamins-Minerals (CENTRUM SILVER PO) Take 1 tablet by mouth daily.    [provider]  Multiple Vitamins-Minerals (ZINC PO) Take 1 tablet by mouth daily.    [provider]  omeprazole (PRILOSEC) 20 MG capsule Take 30- 60 min before your first and last meals of  the day 09/07/19   Tanda Rockers, MD  Polyethyl Glycol-Propyl Glycol (SYSTANE) 0.4-0.3 % SOLN Place 1 drop into both eyes daily as needed (for dry eyes).     [provider]  potassium chloride SA (KLOR-CON) 20 MEQ tablet TAKE 1 TABLET ONCE DAILY. 10/27/19   Larey Dresser, MD    rosuvastatin (CRESTOR) 40 MG tablet TAKE 1 TABLET BY MOUTH DAILY. 01/26/19   Larey Dresser, MD  senna (SENOKOT) 8.6 MG tablet Take 1 tablet by mouth 2 (two) times daily.    [provider]  SYMBICORT 160-4.5 MCG/ACT inhaler USE 2 PUFFS TWICE DAILY. 09/08/19   Tanda Rockers, MD    Allergies    Niacin-lovastatin er, Penicillins, Sulfonamide derivatives, and Atorvastatin  Review of Systems   Review of Systems  Constitutional: Negative for chills and fever.  HENT: Negative for hearing loss.   Respiratory: Negative for shortness of breath.   Cardiovascular: Negative for chest pain, palpitations and syncope.  Gastrointestinal: Positive for nausea. Negative for diarrhea.  Neurological: Positive for dizziness. Negative for syncope, weakness, light-headedness, numbness and headaches.  All other systems reviewed and are negative.   Physical Exam Updated Vital Signs BP (!) 161/80 (BP Location: Right Arm)   Pulse (!) 101   Temp 97.9 F (36.6 C) (Oral)   Resp 16   Ht 6\' 1"  (1.854 m)   Wt 88.5 kg   SpO2 95%   BMI 25.73 kg/m   Physical Exam Vitals and nursing note reviewed.  Constitutional:      Appearance: He is well-developed. He is not toxic-appearing or diaphoretic.  HENT:     Head: Normocephalic and atraumatic.     Ears:     Comments: Normal TM    Mouth/Throat:     Mouth: Mucous membranes are moist.     Pharynx: Oropharynx is clear.  Eyes:     Extraocular Movements: Extraocular movements intact.     Conjunctiva/sclera: Conjunctivae normal.     Pupils: Pupils are equal, round, and reactive to light.     Comments: No nystagmus  Cardiovascular:     Rate and Rhythm: Normal rate and regular rhythm.     Pulses: Normal pulses.     Heart sounds: No murmur.  Pulmonary:     Effort: Pulmonary effort is normal. No respiratory distress.     Breath sounds: Normal breath sounds.  Abdominal:     Palpations: Abdomen is soft.     Tenderness: There is no abdominal  tenderness.  Musculoskeletal:        General: No tenderness or deformity.     Cervical back: Neck supple. No rigidity.  Skin:    General: Skin is warm and dry.  Neurological:     General: No focal deficit present.     Mental Status: He is alert and oriented to person, place, and time.     Cranial Nerves: No cranial nerve deficit.     Sensory: No sensory deficit.     Motor: No weakness.     Coordination: Coordination normal.     Gait: Gait normal.     Deep Tendon Reflexes: Reflexes normal.     Comments: Negative Dix-Hallpike     ED Results / Procedures / Treatments   Labs (all labs ordered are listed, but only abnormal results are displayed) Labs Reviewed  BASIC METABOLIC PANEL - Abnormal; Notable for the following components:      Result Value   Glucose, Bld 164 (*)  All other components within normal limits  CBG MONITORING, ED - Abnormal; Notable for the following components:   Glucose-Capillary 101 (*)    All other components within normal limits  CBC  URINALYSIS, ROUTINE W REFLEX MICROSCOPIC    EKG None   Sinus rhythm with sinus arrhythmia, normal axis and intervals, no ST or T wave changes concerning for ischemia or infarction, no evidence of dangerous arrhythmia.  Radiology DG Chest Portable 1 View  Result Date: 11/23/2019 CLINICAL DATA:  Elevated blood pressure.  Vertigo EXAM: PORTABLE CHEST 1 VIEW COMPARISON:  11/18/2018. FINDINGS: The heart size and mediastinal contours are within normal limits. Right lung volume loss status post right lower lobectomy. Chronic right pleural effusion is unchanged from previous exam. The visualized skeletal structures are unremarkable. IMPRESSION: No active disease. Electronically Signed   By: Kerby Moors M.D.   On: 11/23/2019 19:07    Procedures Procedures (including critical care time)  Medications Ordered in ED Medications  sodium chloride flush (NS) 0.9 % injection 3 mL (has no administration in time range)    ED  Course  I have reviewed the triage vital signs and the nursing notes.  Pertinent labs & imaging results that were available during my care of the patient were reviewed by me and considered in my medical decision making (see chart for details).    MDM Rules/Calculators/A&P                      Patient here for vertigo as well as concerned about hypertension.  Differential includes BPPV, posterior circulation stroke, otitis media.  Patient's vertigo only comes when lying on his back and then rolling to his right side, when it comes it is intense and associated with nausea, it is not present at this time, he has a nonfocal neuro exam, no nystagmus, negative Dix-Hallpike at this time.  As his symptoms come with a specific position/movement this is concerning for BPPV.  The patient is asymptomatic at this time, meclizine is contraindicated as he has history of acute glaucoma, will refer to ENT for evaluation and possible vestibular rehab.  No emergent changes on laboratory evaluation or EKG, patient is resting comfortably in no distress.  Strict return precautions provided, all results discussed with the patient and his wife, discharged in stable condition. Final Clinical Impression(s) / ED Diagnoses Final diagnoses:  Vertigo  Hypertension, unspecified type    Rx / DC Orders ED Discharge Orders    None       Dionisio Aragones, [place], MD 11/24/19 0100    Margette Fast, MD 11/24/19 [phone]

## 2019-11-23 NOTE — Discharge Instructions (Addendum)
Follow up with your primary doctor and call the attached number for ENT for follow up of your vertigo.

## 2019-11-23 NOTE — Telephone Encounter (Signed)
I spoke to the patient's wife [name]) who is calling because the patient has had hypertension the past 5 days along with symptoms of "blurred" vision, dizziness, headache, SOB and fatigue.    I recommended that they go to the ED for further evaluation.  She verbalized understanding.

## 2019-11-23 NOTE — ED Triage Notes (Signed)
Pt reports for the last 5 days noted bp to be high. Having vertigo with room spinning sensation/dizziness. Legally blind. Good compliance with medications per patient. Alert, oriented x4, no focal neuro deficts noted.

## 2019-11-24 ENCOUNTER — Telehealth: Payer: Self-pay

## 2019-11-24 DIAGNOSIS — R42 Dizziness and giddiness: Secondary | ICD-10-CM

## 2019-11-24 NOTE — Telephone Encounter (Signed)
Referral ordered

## 2019-11-24 NOTE — Telephone Encounter (Signed)
New message  The patient wife calling need a referral to [facility] ENT with Dr. Carolynn Serve.  The office is holding an appointment for patient  On 3/2 @ 2 pm   Office phone # [phone]   Fax [phone]

## 2019-11-24 NOTE — Progress Notes (Signed)
Subjective:    Patient ID: Troy Kindred [name], male    DOB: 1941/07/24, 79 y.o.   MRN: [id number]  HPI The patient is here for follow up from the ED.   ED 11/23/19 for 5 days of dizziness that was intermittent and related to head movements.  Typically it occurred with laying down and turning to the right.  He did not have it with exertion or standing.  He had associated with nausea.  There was no chest pain, palpitations.  His exam was normal.  His dix-hallpike was negative.  His BP was a little elevated.  EKG showed SR with sinus arrhythmia w/o acute changes.  It was thought he had BPPV.  He was not symptomatic in the ED. meclizine was contraindicated due to h/o acute angle glaucoma.  His BP was elevated in the ED.    He still has dizziness but it has gotten better.  He has an appointment with ENT next week.    BP has been elevated for the past 6 days. Mostly over 160/? At home.  He is taking his medication as prescribed. He denies chest pain, palpitations, leg edema.      His wife called pharmacy and his eye doctor and he stoppped it - has been on it for   SOB worse in last 6 days.    Medications and allergies reviewed with patient and updated if appropriate.  Patient Active Problem List   Diagnosis Date Noted  . Cancer of sigmoid colon ([place]) 12/12/2018  . Pleural effusion on right 07/15/2018  . Disease of lymph node   . Colon adenocarcinoma ([place]) 06/30/2018  . Acute GI bleeding 06/23/2018  . Depression 05/06/2018  . Syncope 01/29/2018  . Chronic diastolic CHF (congestive heart failure) ([place]) 05/04/2017  . CAD (coronary artery disease) 05/03/2017  . MGUS (monoclonal gammopathy of unknown significance) 03/07/2016  . Benign essential HTN 01/19/2016  . OSA (obstructive sleep apnea) 08/29/2015  . PAF (paroxysmal atrial fibrillation) ([place])   . Glaucoma 06/22/2015  . DOE (dyspnea on exertion) 02/04/2015  . Memory loss, short term 08/03/2014  . Anemia, unspecified  12/28/2013  . Occlusion and stenosis of carotid artery without mention of cerebral infarction 07/09/2013  . COPD GOLD II  07/09/2013  . Chronic renal disease, stage 3, moderately decreased glomerular filtration rate between 30-59 mL/min/1.73 square meter (HCC) 07/06/2013  . TIA (transient ischemic attack) 07/06/2013  . Basal cell carcinoma of skin 10/10/2012  . History of lung cancer 03/11/2012  . PAD (peripheral artery disease) ([place]) 12/16/2011  . AAA (abdominal aortic aneurysm) ([place]) 09/27/2011  . Squamous cell lung cancer ([place]) 11/08/2010  . GAIT IMBALANCE 03/02/2009  . Prediabetes 03/02/2009  . COLONIC POLYPS, HX OF 03/02/2009  . OBSTRUCTIVE CHRONIC BRONCHITIS WITHOUT EXACERBAT 01/15/2008  . HYPERPLASIA PROSTATE UNS W/O UR OBST & OTH LUTS 01/15/2008  . HYPERLIPIDEMIA 09/29/2007  . Peripheral vascular disease ([place]) 09/29/2007  . LEUKOPLAKIA, VOCAL CORDS 09/29/2007  . FEMORAL BRUIT 09/29/2007    Current Outpatient Medications on File Prior to Visit  Medication Sig Dispense Refill  . acetaminophen (TYLENOL) 325 MG tablet Take 325 mg by mouth every 6 (six) hours as needed for moderate pain or headache.     [name] amLODipine (NORVASC) 5 MG tablet TAKE 1 TABLET ONCE DAILY. 28 tablet 0  . Ascorbic Acid (VITAMIN C PO) Take 1 tablet by mouth daily.    . AZOPT 1 % ophthalmic suspension     . bimatoprost (LUMIGAN) 0.01 % SOLN  Place 1 drop at bedtime into both eyes.     [name] docusate sodium (COLACE) 100 MG capsule Take 100 mg by mouth at bedtime.     [name] ELIQUIS 5 MG TABS tablet TAKE 1 TABLET BY MOUTH TWICE DAILY. 56 tablet 0  . ferrous sulfate 325 (65 FE) MG tablet Take 325 mg by mouth at bedtime.     [name] FLUoxetine (PROZAC) 40 MG capsule Take 1 capsule (40 mg total) by mouth daily. 90 capsule 3  . furosemide (LASIX) 20 MG tablet TAKE 2 TABLETS EACH MORNING. 56 tablet 0  . Magnesium 500 MG CAPS Take 500 mg by mouth daily.     . Melatonin 5 MG TABS Take 5 mg by mouth at bedtime.    . metoprolol  succinate (TOPROL-XL) 25 MG 24 hr tablet Take 0.5 tablets (12.5 mg total) by mouth at bedtime. Take with or immediately following a meal. 45 tablet 1  . montelukast (SINGULAIR) 10 MG tablet TAKE ONE TABLET AT BEDTIME. 30 tablet 5  . Multiple Vitamins-Minerals (CENTRUM SILVER PO) Take 1 tablet by mouth daily.    . Multiple Vitamins-Minerals (ZINC PO) Take 1 tablet by mouth daily.    [name] omeprazole (PRILOSEC) 20 MG capsule Take 30- 60 min before your first and last meals of the day    . Polyethyl Glycol-Propyl Glycol (SYSTANE) 0.4-0.3 % SOLN Place 1 drop into both eyes daily as needed (for dry eyes).     . potassium chloride SA (KLOR-CON) 20 MEQ tablet TAKE 1 TABLET ONCE DAILY. 28 tablet 0  . rosuvastatin (CRESTOR) 40 MG tablet TAKE 1 TABLET BY MOUTH DAILY. 90 tablet 0  . senna (SENOKOT) 8.6 MG tablet Take 1 tablet by mouth 2 (two) times daily.    . SYMBICORT 160-4.5 MCG/ACT inhaler USE 2 PUFFS TWICE DAILY. 10.2 g 5   No current facility-administered medications on file prior to visit.    Past Medical History:  Diagnosis Date  . AAA (abdominal aortic aneurysm) ([place]) LAST ABDOMINAL US 10-20-17 3.3 CM   MONITORED BY DR [name]  . Anxiety   . Arthritis   . Atrial fibrillation ([place])   . Basal cell carcinoma   . Bilateral carotid artery stenosis DUPLEX 12-29-2012  BY DR [facility]   BILATERAL ICA STENOSIS 60-79%  . BPH (benign prostatic hypertrophy)   . Chronic diastolic heart failure ([place])   . Chronic kidney disease    stage 3, pt unaware  . Colon cancer ([place])   . Complication of anesthesia    had nausea after carotid artery surgery, states the medicine given to help the nausea, made it worse  . COPD (chronic obstructive pulmonary disease) ([place])   . Depression   . GERD (gastroesophageal reflux disease)   . Glaucoma BOTH EYES   Dr Gershon Crane  . History of basal cell carcinoma excision   . History of lung cancer APRIL 2012  SQUAMOUS CELL---- S/P RIGHT LOWER LOBECTOMY AT [facility] --  NO  CHEMORADIATION---  NO RECURRENCE    ONCOLOGIST- DR Tressie Stalker  LOV IN [facility] 10-27-2012  . History of pneumothorax    pt unaware  . Hx of adenomatous colonic polyps 2005    X 2; 1 hyperplastic polyp; Dr Olevia Perches  . Hyperlipidemia   . Hypertension   . Impaired fasting glucose 2007   108; A1c5.4%  . Lesion of bladder   . Lung cancer ([place]) 01/25/2012  . Microhematuria   . OSA (obstructive sleep apnea) 08/29/2015   CPAP SET ON 10  .  PAD (peripheral artery disease) ([place]) ABI'S  JAN 2014  0.65 ON RIGHT ;  1.04 ON LEFT  . Peripheral vascular disease ([place]) S/P ANGIOPLASTY AND STENTING   FOLLOWED  BY DR [name]  . Pleural effusion on right 11/18/2018   Chronic, noted on CXR  . Spinal stenosis Sept. 2015  . Status post placement of implantable loop recorder   . Stroke [facility]) Jul 07, 2013   mini  TIA  . Thoracic aorta atherosclerosis ([place])   . Thyrotoxicosis    amiodarone induced  . Urgency of urination     Past Surgical History:  Procedure Laterality Date  . ANGIO PLASTY     X 4 in legs  . AORTOGRAM  07-27-2002   MILD DIFFUSE ILIAC ARTERY OCCLUSIVE DISEASE /  LEFT RENAL ARTERY 20%/ PATENT LEFT FEM-POP GRAFT/ MILD SFA AND POPLITEAL ARTERY OCCLUSIVE DISEASE W/ SEVERE KIDNEY OCCLUSIVE DISEASE  . ATRIAL FIBRILLATION ABLATION N/A 04/09/2019   Procedure: ATRIAL FIBRILLATION ABLATION;  Surgeon: Thompson Grayer, MD;  Location: [place] [facility];  Service: Cardiovascular;  Laterality: N/A;  . BASAL CELL CARCINOMA EXCISION     MULTIPLE TIMES--  RIGHT FOREARM, CHEEKS, AND BACK  . BIOPSY  06/25/2018   Procedure: BIOPSY;  Surgeon: Rush Landmark Telford Nab., MD;  Location: [place];  Service: Gastroenterology;;  . CARDIOVASCULAR STRESS TEST  12-08-2012  DR MCLEAN   NORMAL LEXISCAN WITH NO EXERCISE NUCLEAR STUDY/ EF 66%/   NO ISCHEMIA/ NO SIGNIFICANT CHANGE FROM PRIOR STUDY  . CARDIOVERSION N/A 02/14/2018   Procedure: CARDIOVERSION;  Surgeon: Larey Dresser, MD;  Location: [facility] ENDOSCOPY;  Service:  Cardiovascular;  Laterality: N/A;  . CAROTID ANGIOGRAM N/A 07/10/2013   Procedure: CAROTID ANGIOGRAM;  Surgeon: Elam Dutch, MD;  Location: [facility] CATH LAB;  Service: Cardiovascular;  Laterality: N/A;  . CAROTID ENDARTERECTOMY Right 07-14-13   cea  . CATARACT EXTRACTION W/ INTRAOCULAR LENS  IMPLANT, BILATERAL    . colon polyectomy    . COLONOSCOPY WITH PROPOFOL N/A 06/25/2018   Procedure: COLONOSCOPY WITH PROPOFOL;  Surgeon: Rush Landmark Telford Nab., MD;  Location: [place];  Service: Gastroenterology;  Laterality: N/A;  . CYSTOSCOPY W/ RETROGRADES Bilateral 01/21/2013   Procedure: CYSTOSCOPY WITH RETROGRADE PYELOGRAM;  Surgeon: Molli Hazard, MD;  Location: [facility];  Service: Urology;  Laterality: Bilateral;   CYSTO, BLADDER BIOPSY, BILATERAL RETROGRADE PYELOGRAM  RAD TECH FROM RADIOLOGY PER JOY  . CYSTOSCOPY WITH BIOPSY N/A 01/21/2013   Procedure: CYSTOSCOPY WITH BIOPSY;  Surgeon: Molli Hazard, MD;  Location: [facility];  Service: Urology;  Laterality: N/A;  . ENDARTERECTOMY Right 07/14/2013   Procedure: ENDARTERECTOMY CAROTID;  Surgeon: Angelia Mould, MD;  Location: [place];  Service: Vascular;  Laterality: Right;  . EP IMPLANTABLE DEVICE N/A 08/12/2015   Procedure: Loop Recorder Insertion;  Surgeon: Thompson Grayer, MD;  Location: [place] CV [facility];  Service: Cardiovascular;  Laterality: N/A;  . ESOPHAGOGASTRODUODENOSCOPY (EGD) WITH PROPOFOL N/A 06/25/2018   Procedure: ESOPHAGOGASTRODUODENOSCOPY (EGD) WITH PROPOFOL;  Surgeon: Rush Landmark Telford Nab., MD;  Location: [place];  Service: Gastroenterology;  Laterality: N/A;  . ESOPHAGOGASTRODUODENOSCOPY (EGD) WITH PROPOFOL N/A 07/10/2018   Procedure: ESOPHAGOGASTRODUODENOSCOPY (EGD) WITH PROPOFOL;  Surgeon: Milus Banister, MD;  Location: [facility] ENDOSCOPY;  Service: Endoscopy;  Laterality: N/A;  . EUS N/A 07/10/2018   Procedure: UPPER ENDOSCOPIC ULTRASOUND (EUS) RADIAL;  Surgeon:  Milus Banister, MD;  Location: [facility] ENDOSCOPY;  Service: Endoscopy;  Laterality: N/A;  . EYE SURGERY Right   . FEMORAL-POPLITEAL  BYPASS GRAFT Left [facility]   AND 2001  IN [place]  . FEMORAL-POPLITEAL BYPASS GRAFT Left 08/30/2015   Procedure: REVISION OF BYPASS GRAFT Left  FEMORAL-POPLITEAL ARTERY;  Surgeon: Angelia Mould, MD;  Location: [place];  Service: Vascular;  Laterality: Left;  . FINE NEEDLE ASPIRATION N/A 07/10/2018   Procedure: FINE NEEDLE ASPIRATION (FNA) LINEAR;  Surgeon: Milus Banister, MD;  Location: [facility] ENDOSCOPY;  Service: Endoscopy;  Laterality: N/A;  . FLEXIBLE SIGMOIDOSCOPY N/A 12/12/2018   Procedure: FLEXIBLE SIGMOIDOSCOPY;  Surgeon: Ileana Roup, MD;  Location: [facility] ORS;  Service: General;  Laterality: N/A;  . LARYNGOSCOPY  06-27-2004   BX VOCAL CORD  (LEUKOPLAKIA)  PER PT NO ISSUES SINCE  . LEFT HEART CATH AND CORONARY ANGIOGRAPHY N/A 08/20/2017   Procedure: LEFT HEART CATH AND CORONARY ANGIOGRAPHY;  Surgeon: Larey Dresser, MD;  Location: [place] [facility];  Service: Cardiovascular;  Laterality: N/A;  . LOOP RECORDER INSERTION N/A 04/09/2019   Procedure: LOOP RECORDER INSERTION;  Surgeon: Thompson Grayer, MD;  Location: [place] [facility];  Service: Cardiovascular;  Laterality: N/A;  . LOOP RECORDER REMOVAL N/A 04/09/2019   Procedure: LOOP RECORDER REMOVAL;  Surgeon: Thompson Grayer, MD;  Location: [place] [facility];  Service: Cardiovascular;  Laterality: N/A;  . LOWER EXTREMITY ANGIOGRAM Bilateral 08/29/2015   Procedure: Lower Extremity Angiogram;  Surgeon: Angelia Mould, MD;  Location: [place] [facility];  Service: Cardiovascular;  Laterality: Bilateral;  . LUNG LOBECTOMY  01/24/2011    RIGHT UPPER LOBE  (SQUAMOUS CELL CARCINOMA) Dr Dorthea Cove , [facility]. No chemotherapyor radiation  . PATCH ANGIOPLASTY Right 07/14/2013   Procedure: PATCH ANGIOPLASTY;  Surgeon: Angelia Mould, MD;  Location: [name];  Service: Vascular;  Laterality: Right;  .  PATCH ANGIOPLASTY Left 08/30/2015   Procedure: VEIN PATCH ANGIOPLASTY OF PROXIMAL Left BYPASS GRAFT;  Surgeon: Angelia Mould, MD;  Location: [place];  Service: Vascular;  Laterality: Left;  . PERIPHERAL VASCULAR CATHETERIZATION N/A 08/29/2015   Procedure: Abdominal Aortogram;  Surgeon: Angelia Mould, MD;  Location: [place] [facility];  Service: Cardiovascular;  Laterality: N/A;  . POLYPECTOMY  06/25/2018   Procedure: POLYPECTOMY;  Surgeon: Rush Landmark Telford Nab., MD;  Location: [place];  Service: Gastroenterology;;  . Lia Foyer INJECTION  06/25/2018   Procedure: SUBMUCOSAL INJECTION;  Surgeon: Irving Copas., MD;  Location: [place];  Service: Gastroenterology;;  . TEE WITHOUT CARDIOVERSION N/A 02/14/2018   Procedure: TRANSESOPHAGEAL ECHOCARDIOGRAM (TEE);  Surgeon: Larey Dresser, MD;  Location[facility] ENDOSCOPY;  Service: Cardiovascular;  Laterality: N/A;  . trabecular surgery     OS  . TRANSTHORACIC ECHOCARDIOGRAM  12-29-2012  DR [facility]   MILD LVH/  LVSF NORMAL/ EF 08-67%/  GRADE I DIASTOLIC DYSFUNCTION    Social History   Socioeconomic History  . Marital status: Married    Spouse name: [name]   . Number of children: 3  . Years of education: 12+  . Highest education level: Not on file  Occupational History  . 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 Smoking status: Former Smoker    Packs/day: 0.50    Years: 50.00    Pack years: 25.00    Types: Cigarettes    Quit date: 07/28/2014    Years since quitting: 5.3  . Smokeless tobacco: Never Used  Substance and Sexual Activity  .  Alcohol use: Yes    Alcohol/week: 0.0 standard drinks    Comment:  socially, variable  . Drug use: No  . Sexual activity: Not on file  Other Topics Concern  . Not on file  Social History Narrative   Patient lives at home with spouse [name]    Patient has 3 children    Patient is right handed    Social Determinants of Health   Financial Resource Strain:   . Difficulty of Paying Living Expenses: Not on file  Food Insecurity:   . Worried About Charity fundraiser in the Last Year: Not on file  . Ran Out of Food in the Last Year: Not on file  Transportation Needs:   . Lack of Transportation (Medical): Not on file  . Lack of Transportation (Non-Medical): Not on file  Physical Activity:   . Days of Exercise per Week: Not on file  . Minutes of Exercise per Session: Not on file  Stress:   . Feeling of Stress : Not on file  Social Connections:   . Frequency of Communication with Friends and Family: Not on file  . Frequency of Social Gatherings with Friends and Family: Not on file  . Attends Religious Services: Not on file  . Active Member of Clubs or Organizations: Not on file  . Attends Archivist Meetings: Not on file  . Marital Status: Not on file    Family History  Problem Relation Age of Onset  . Stroke Mother        mini strokes  . Alcohol abuse Father   . Heart disease Father        MI after 59  . Stroke Father   . Hypertension Father   . Heart attack Father   . Heart disease Paternal Aunt        several  . Hypertension Paternal Aunt        several  . Stroke Paternal Aunt        several  . Stroke Paternal Uncle        several  . Heart disease Paternal Uncle        several;2 had MI pre 45  . Cancer Daughter 87       breast ca, also with benign sessile polyp     Review of Systems  Constitutional: Negative for chills and fever.  Respiratory: Positive for shortness of breath (chronic). Negative for cough and wheezing.   Cardiovascular: Negative for chest pain, palpitations and leg swelling.  Neurological: Positive for dizziness, light-headedness and headaches (in mornings).       Objective:   Vitals:   11/25/19 1432  BP: (!) 156/78  Pulse: 87  Resp: 16  Temp: 98.3 F (36.8 C)  SpO2:  95%   BP Readings from Last 3 Encounters:  11/25/19 (!) 156/78  11/23/19 (!) 138/109  11/18/19 (!) 144/75   Wt Readings from Last 3 Encounters:  11/25/19 196 lb (88.9 kg)  11/23/19 195 lb (88.5 kg)  11/18/19 199 lb 12.8 oz (90.6 kg)   Body mass index is 25.86 kg/m.   Physical Exam    Constitutional: Appears well-developed and well-nourished. No distress.  HENT:  Head: Normocephalic and atraumatic.  Neck: Neck supple. No tracheal deviation present. No thyromegaly present.  No cervical lymphadenopathy Cardiovascular: Normal rate, regular rhythm and normal heart sounds.  No murmur heard. No carotid bruit .  No edema Pulmonary/Chest: Effort normal and breath sounds normal. No respiratory distress. No has no  wheezes. No rales.  Skin: Skin is warm and dry. Not diaphoretic.  Psychiatric: Normal mood and affect. Behavior is normal.      Assessment & Plan:    See Problem List for Assessment and Plan of chronic medical problems.    This visit occurred during the SARS-CoV-2 public health emergency.  Safety protocols were in place, including screening questions prior to the visit, additional usage of staff PPE, and extensive cleaning of exam room while observing appropriate contact time as indicated for disinfecting solutions.

## 2019-11-24 NOTE — Telephone Encounter (Signed)
Pt aware.

## 2019-11-24 NOTE — Telephone Encounter (Signed)
Looks like he has been being seen for vertigo and syncope

## 2019-11-25 ENCOUNTER — Other Ambulatory Visit (HOSPITAL_COMMUNITY): Payer: Self-pay | Admitting: Cardiology

## 2019-11-25 ENCOUNTER — Ambulatory Visit (INDEPENDENT_AMBULATORY_CARE_PROVIDER_SITE_OTHER): Payer: PPO | Admitting: Internal Medicine

## 2019-11-25 ENCOUNTER — Other Ambulatory Visit: Payer: Self-pay

## 2019-11-25 ENCOUNTER — Ambulatory Visit: Payer: PPO | Admitting: Psychiatry

## 2019-11-25 ENCOUNTER — Encounter: Payer: Self-pay | Admitting: Internal Medicine

## 2019-11-25 DIAGNOSIS — R42 Dizziness and giddiness: Secondary | ICD-10-CM | POA: Insufficient documentation

## 2019-11-25 DIAGNOSIS — R0602 Shortness of breath: Secondary | ICD-10-CM

## 2019-11-25 DIAGNOSIS — I10 Essential (primary) hypertension: Secondary | ICD-10-CM | POA: Diagnosis not present

## 2019-11-25 DIAGNOSIS — I509 Heart failure, unspecified: Secondary | ICD-10-CM

## 2019-11-25 MED ORDER — LOSARTAN POTASSIUM 25 MG PO TABS: 25.0000 mg | ORAL_TABLET | Freq: Every day | ORAL | 5 refills | Status: DC

## 2019-11-25 NOTE — Patient Instructions (Addendum)
  Medications reviewed and updated.  Changes include :   Start losartan 25mg  daily  Your prescription(s) have been submitted to your pharmacy. Please take as directed and contact our office if you believe you are having problem(s) with the medication(s).    Please followup in about 6 months

## 2019-11-25 NOTE — Assessment & Plan Note (Signed)
Chronic Not controlled Start losartan 25 mg daily Continue amlodipine and metoprolol at current doses Will monitor BP at home Bmp in next month - has two upcoming appointments and should be able to have that done then - if not can have it done here

## 2019-11-25 NOTE — Assessment & Plan Note (Signed)
Acute BPPV Evaluation in ED negative Agree this BPPV Symptoms has gotten better Deferred referral to PT today Will see ENT next week

## 2019-11-27 ENCOUNTER — Other Ambulatory Visit: Payer: Self-pay

## 2019-11-27 NOTE — Telephone Encounter (Signed)
Dr.Mansouraty please see recommendations regarding Eliquis. Please let me know if you are ok with previous recommendation or would like for [name] to contact Dr. Jackalyn Lombard  to inquire about holding 2 days prior to procedure?

## 2019-11-28 NOTE — Telephone Encounter (Signed)
Thank you for the update. The day before his procedure and day of the procedure. I will restart as soon as able based on findings and colonoscopy.   Thank you. GM

## 2019-11-30 ENCOUNTER — Other Ambulatory Visit: Payer: Self-pay

## 2019-11-30 ENCOUNTER — Encounter: Payer: Self-pay | Admitting: Internal Medicine

## 2019-11-30 ENCOUNTER — Ambulatory Visit (INDEPENDENT_AMBULATORY_CARE_PROVIDER_SITE_OTHER): Payer: PPO | Admitting: *Deleted

## 2019-11-30 ENCOUNTER — Ambulatory Visit (INDEPENDENT_AMBULATORY_CARE_PROVIDER_SITE_OTHER): Payer: PPO | Admitting: Internal Medicine

## 2019-11-30 VITALS — BP 122/78 | HR 78 | Ht 73.0 in | Wt 199.0 lb

## 2019-11-30 DIAGNOSIS — R55 Syncope and collapse: Secondary | ICD-10-CM

## 2019-11-30 DIAGNOSIS — E0580 Other thyrotoxicosis without thyrotoxic crisis or storm: Secondary | ICD-10-CM

## 2019-11-30 DIAGNOSIS — I10 Essential (primary) hypertension: Secondary | ICD-10-CM | POA: Diagnosis not present

## 2019-11-30 LAB — CUP PACEART REMOTE DEVICE CHECK
Date Time Interrogation Session: 20210301003948
Implantable Pulse Generator Implant Date: 20200709

## 2019-11-30 NOTE — Progress Notes (Addendum)
Patient ID: Troy Ehrler [name], male   DOB: [birth year], 79 y.o.   MRN: [id number]   This visit occurred during the SARS-CoV-2 public health emergency.  Safety protocols were in place, including screening questions prior to the visit, additional usage of staff PPE, and extensive cleaning of exam room while observing appropriate contact time as indicated for disinfecting solutions.   HPI  Troy Ingham [name] is a 79 y.o.-year-old male, initially referred by his cardiologist, [name], presenting for follow-up for amiodarone induced thyrotoxicosis.  Last visit was 6 months ago.  He started to have vertigo 1 week ago. Now improving..  Reviewed and addended history: Patient is seen in the cardiology clinic for several cardiovascular conditions: paroxysmal Afib, Diastolic CHF, CAD.  He also has a history of AAA, PAD, history of TIA.  He was found to have a low TSH at his visit on 07/14/2018.  He had repeat TFTs 9 days later and this showed again a suppressed TSH and also elevated free T4 and free T3.    She was taking a high dose of biotin, 10,000 mcg daily and he took this before his TFTs in 10-08/2018. He had a CT of the chest with contrast on 06/25/2018.  At that time, his amiodarone (that he was taking for almost 1 year) was stopped.    He was started on methimazole 10 mg daily.  He also started Toprol-XL 25 mg daily.  We had to increase the dose of methimazole slightly to 15 mg daily >> but then decreased to 10 mg daily.  He continues on this dose today.  We also started Prednisone 10 mg daily >> we were then able to decrease the dose and finally stop 10/2018.  TFTs normalized in 11/2018.  However, afterwards TSH increased so we decreased her methimazole - in 03/2019: 2.5 mg daily.  In 05/2019, we stop methimazole completely.  However, he did not return for labs afterwards...  Reviewed his TFTs: Lab Results  Component Value Date   TSH 3.52 05/29/2019   TSH 5.73 (H) 03/02/2019    TSH 11.765 (H) 01/29/2019   TSH 3.57 11/27/2018   TSH 0.02 (L) 10/27/2018   TSH <0.010 (L) 10/14/2018   TSH <0.01 (L) 09/26/2018   TSH <0.01 (L) 08/27/2018   TSH 0.010 (L) 07/23/2018   TSH 0.03 (L) 07/14/2018   FREET4 0.93 05/29/2019   FREET4 0.73 03/02/2019   FREET4 0.70 (L) 01/29/2019   FREET4 0.90 11/27/2018   FREET4 1.34 10/27/2018   FREET4 1.71 10/14/2018   FREET4 2.64 (H) 09/26/2018   FREET4 3.84 (H) 08/27/2018   FREET4 2.94 (H) 07/23/2018   FREET4 0.9 07/29/2006   T3FREE 3.0 05/29/2019   T3FREE 2.6 03/02/2019   T3FREE 2.9 11/27/2018   T3FREE 3.0 10/27/2018   T3FREE 3.1 10/14/2018   T3FREE 3.9 09/26/2018   T3FREE 5.6 (H) 08/27/2018   T3FREE 4.7 (H) 07/23/2018   T3FREE 3.0 07/29/2006   His Graves' antibodies were not elevated: Lab Results  Component Value Date   TSI <89 08/27/2018   Pt denies: - feeling nodules in neck - hoarseness - dysphagia - choking - SOB with lying down  Pt does not have a FH of thyroid ds. No FH of thyroid cancer. No h/o radiation tx to head or neck.  No seaweed or kelp. No recent contrast studies. No herbal supplements. No Biotin use. No recent steroids use.   Pt. also has a history of lung cancer, HL, COPD, OSA-on CPAP. He also  has a history of colon cancer and had a low anterior resection 11/2018.  No metastases.  No need for chemotherapy and radiation therapy.   His oncologist is Dr. Learta Codding.  ROS: Constitutional: no weight gain/no weight loss, no fatigue, no subjective hyperthermia, no subjective hypothermia Eyes: no blurry vision, no xerophthalmia ENT: no sore throat, + see HPI Cardiovascular: no CP/no SOB/no palpitations/no leg swelling Respiratory: no cough/no SOB/no wheezing Gastrointestinal: no N/no V/no D/no C/no acid reflux Musculoskeletal: no muscle aches/no joint aches Skin: no rashes, no hair loss Neurological: no tremors/no numbness/no tingling/+ dizziness, + vertigo  I reviewed pt's medications, allergies, PMH,  social hx, family hx, and changes were documented in the history of present illness. Otherwise, unchanged from my initial visit note. He was started on an antihypertensive: Losartan 25.  Past Medical History:  Diagnosis Date  . AAA (abdominal aortic aneurysm) ([place]) LAST ABDOMINAL US 10-20-17 3.3 CM   MONITORED BY DR [name]  . Anxiety   . Arthritis   . Atrial fibrillation ([place])   . Basal cell carcinoma   . Bilateral carotid artery stenosis DUPLEX 12-29-2012  BY DR [facility]   BILATERAL ICA STENOSIS 60-79%  . BPH (benign prostatic hypertrophy)   . Chronic diastolic heart failure ([place])   . Chronic kidney disease    stage 3, pt unaware  . Colon cancer ([place])   . Complication of anesthesia    had nausea after carotid artery surgery, states the medicine given to help the nausea, made it worse  . COPD (chronic obstructive pulmonary disease) ([place])   . Depression   . GERD (gastroesophageal reflux disease)   . Glaucoma BOTH EYES   Dr Gershon Crane  . History of basal cell carcinoma excision   . History of lung cancer APRIL 2012  SQUAMOUS CELL---- S/P RIGHT LOWER LOBECTOMY AT [facility] --  NO CHEMORADIATION---  NO RECURRENCE    ONCOLOGIST- DR Tressie Stalker  LOV IN [facility] 10-27-2012  . History of pneumothorax    pt unaware  . Hx of adenomatous colonic polyps 2005    X 2; 1 hyperplastic polyp; Dr Olevia Perches  . Hyperlipidemia   . Hypertension   . Impaired fasting glucose 2007   108; A1c5.4%  . Lesion of bladder   . Lung cancer ([place]) 01/25/2012  . Microhematuria   . OSA (obstructive sleep apnea) 08/29/2015   CPAP SET ON 10  . PAD (peripheral artery disease) ([place]) ABI'S  JAN 2014  0.65 ON RIGHT ;  1.04 ON LEFT  . Peripheral vascular disease ([place]) S/P ANGIOPLASTY AND STENTING   FOLLOWED  BY DR [name]  . Pleural effusion on right 11/18/2018   Chronic, noted on CXR  . Spinal stenosis Sept. 2015  . Status post placement of implantable loop recorder   . Stroke [facility]) Jul 07, 2013   mini  TIA  . Thoracic aorta  atherosclerosis ([place])   . Thyrotoxicosis    amiodarone induced  . Urgency of urination    Past Surgical History:  Procedure Laterality Date  . ANGIO PLASTY     X 4 in legs  . AORTOGRAM  07-27-2002   MILD DIFFUSE ILIAC ARTERY OCCLUSIVE DISEASE /  LEFT RENAL ARTERY 20%/ PATENT LEFT FEM-POP GRAFT/ MILD SFA AND POPLITEAL ARTERY OCCLUSIVE DISEASE W/ SEVERE KIDNEY OCCLUSIVE DISEASE  . ATRIAL FIBRILLATION ABLATION N/A 04/09/2019   Procedure: ATRIAL FIBRILLATION ABLATION;  Surgeon: Thompson Grayer, MD;  Location: [place] CV LAB;  Service: Cardiovascular;  Laterality: N/A;  . BASAL CELL CARCINOMA EXCISION  MULTIPLE TIMES--  RIGHT FOREARM, CHEEKS, AND BACK  . BIOPSY  06/25/2018   Procedure: BIOPSY;  Surgeon: Rush Landmark Telford Nab., MD;  Location: [place];  Service: Gastroenterology;;  . CARDIOVASCULAR STRESS TEST  12-08-2012  DR MCLEAN   NORMAL LEXISCAN WITH NO EXERCISE NUCLEAR STUDY/ EF 66%/   NO ISCHEMIA/ NO SIGNIFICANT CHANGE FROM PRIOR STUDY  . CARDIOVERSION N/A 02/14/2018   Procedure: CARDIOVERSION;  Surgeon: Larey Dresser, MD;  Location[facility] ENDOSCOPY;  Service: Cardiovascular;  Laterality: N/A;  . CAROTID ANGIOGRAM N/A 07/10/2013   Procedure: CAROTID ANGIOGRAM;  Surgeon: Elam Dutch, MD;  Location: [facility] CATH LAB;  Service: Cardiovascular;  Laterality: N/A;  . CAROTID ENDARTERECTOMY Right 07-14-13   cea  . CATARACT EXTRACTION W/ INTRAOCULAR LENS  IMPLANT, BILATERAL    . colon polyectomy    . COLONOSCOPY WITH PROPOFOL N/A 06/25/2018   Procedure: COLONOSCOPY WITH PROPOFOL;  Surgeon: Rush Landmark Telford Nab., MD;  Location: [place];  Service: Gastroenterology;  Laterality: N/A;  . CYSTOSCOPY W/ RETROGRADES Bilateral 01/21/2013   Procedure: CYSTOSCOPY WITH RETROGRADE PYELOGRAM;  Surgeon: Molli Hazard, MD;  Location: [facility];  Service: Urology;  Laterality: Bilateral;   CYSTO, BLADDER BIOPSY, BILATERAL RETROGRADE PYELOGRAM  RAD TECH FROM RADIOLOGY PER  JOY  . CYSTOSCOPY WITH BIOPSY N/A 01/21/2013   Procedure: CYSTOSCOPY WITH BIOPSY;  Surgeon: Molli Hazard, MD;  Location: [facility];  Service: Urology;  Laterality: N/A;  . ENDARTERECTOMY Right 07/14/2013   Procedure: ENDARTERECTOMY CAROTID;  Surgeon: Angelia Mould, MD;  Location: [place];  Service: Vascular;  Laterality: Right;  . EP IMPLANTABLE DEVICE N/A 08/12/2015   Procedure: Loop Recorder Insertion;  Surgeon: Thompson Grayer, MD;  Location: [place] CV LAB;  Service: Cardiovascular;  Laterality: N/A;  . ESOPHAGOGASTRODUODENOSCOPY (EGD) WITH PROPOFOL N/A 06/25/2018   Procedure: ESOPHAGOGASTRODUODENOSCOPY (EGD) WITH PROPOFOL;  Surgeon: Rush Landmark Telford Nab., MD;  Location: [place];  Service: Gastroenterology;  Laterality: N/A;  . ESOPHAGOGASTRODUODENOSCOPY (EGD) WITH PROPOFOL N/A 07/10/2018   Procedure: ESOPHAGOGASTRODUODENOSCOPY (EGD) WITH PROPOFOL;  Surgeon: Milus Banister, MD;  Location: [facility] ENDOSCOPY;  Service: Endoscopy;  Laterality: N/A;  . EUS N/A 07/10/2018   Procedure: UPPER ENDOSCOPIC ULTRASOUND (EUS) RADIAL;  Surgeon: Milus Banister, MD;  Location: [facility] ENDOSCOPY;  Service: Endoscopy;  Laterality: N/A;  . EYE SURGERY Right   . FEMORAL-POPLITEAL BYPASS GRAFT Left 1994  [facility]   AND 2001  IN [place]  . FEMORAL-POPLITEAL BYPASS GRAFT Left 08/30/2015   Procedure: REVISION OF BYPASS GRAFT Left  FEMORAL-POPLITEAL ARTERY;  Surgeon: Angelia Mould, MD;  Location: [place];  Service: Vascular;  Laterality: Left;  . FINE NEEDLE ASPIRATION N/A 07/10/2018   Procedure: FINE NEEDLE ASPIRATION (FNA) LINEAR;  Surgeon: Milus Banister, MD;  Location: [facility] ENDOSCOPY;  Service: Endoscopy;  Laterality: N/A;  . FLEXIBLE SIGMOIDOSCOPY N/A 12/12/2018   Procedure: FLEXIBLE SIGMOIDOSCOPY;  Surgeon: Ileana Roup, MD;  Location: [facility] ORS;  Service: General;  Laterality: N/A;  . LARYNGOSCOPY  06-27-2004   BX VOCAL CORD  (LEUKOPLAKIA)  PER PT NO ISSUES SINCE   . LEFT HEART CATH AND CORONARY ANGIOGRAPHY N/A 08/20/2017   Procedure: LEFT HEART CATH AND CORONARY ANGIOGRAPHY;  Surgeon: Larey Dresser, MD;  Location: [place] CV LAB;  Service: Cardiovascular;  Laterality: N/A;  . LOOP RECORDER INSERTION N/A 04/09/2019   Procedure: LOOP RECORDER INSERTION;  Surgeon: Thompson Grayer, MD;  Location: [place] CV LAB;  Service: Cardiovascular;  Laterality: N/A;  . LOOP  RECORDER REMOVAL N/A 04/09/2019   Procedure: LOOP RECORDER REMOVAL;  Surgeon: Thompson Grayer, MD;  Location: [place] CV LAB;  Service: Cardiovascular;  Laterality: N/A;  . LOWER EXTREMITY ANGIOGRAM Bilateral 08/29/2015   Procedure: Lower Extremity Angiogram;  Surgeon: Angelia Mould, MD;  Location: [place] CV LAB;  Service: Cardiovascular;  Laterality: Bilateral;  . LUNG LOBECTOMY  01/24/2011    RIGHT UPPER LOBE  (SQUAMOUS CELL CARCINOMA) Dr Dorthea Cove , [facility]. No chemotherapyor radiation  . PATCH ANGIOPLASTY Right 07/14/2013   Procedure: PATCH ANGIOPLASTY;  Surgeon: Angelia Mould, MD;  Location: [place];  Service: Vascular;  Laterality: Right;  . PATCH ANGIOPLASTY Left 08/30/2015   Procedure: VEIN PATCH ANGIOPLASTY OF PROXIMAL Left BYPASS GRAFT;  Surgeon: Angelia Mould, MD;  Location: [place];  Service: Vascular;  Laterality: Left;  . PERIPHERAL VASCULAR CATHETERIZATION N/A 08/29/2015   Procedure: Abdominal Aortogram;  Surgeon: Angelia Mould, MD;  Location: [place] CV LAB;  Service: Cardiovascular;  Laterality: N/A;  . POLYPECTOMY  06/25/2018   Procedure: POLYPECTOMY;  Surgeon: Rush Landmark Telford Nab., MD;  Location: [place];  Service: Gastroenterology;;  . Lia Foyer INJECTION  06/25/2018   Procedure: SUBMUCOSAL INJECTION;  Surgeon: Irving Copas., MD;  Location: [place];  Service: Gastroenterology;;  . TEE WITHOUT CARDIOVERSION N/A 02/14/2018   Procedure: TRANSESOPHAGEAL ECHOCARDIOGRAM (TEE);  Surgeon: Larey Dresser, MD;  Location: [facility]  ENDOSCOPY;  Service: Cardiovascular;  Laterality: N/A;  . trabecular surgery     OS  . TRANSTHORACIC ECHOCARDIOGRAM  12-29-2012  DR [facility]   MILD LVH/  LVSF NORMAL/ EF 80-99%/  GRADE I DIASTOLIC DYSFUNCTION   Social History   Socioeconomic History  . Marital status: Married    Spouse name: [name]   . Number of children: 3  . Years of education: 12+  . Highest education level: Not on file  Occupational History  . 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 Smoking status: Former Smoker    Packs/day: 0.50    Years: 50.00    Pack years: 25.00    Types: Cigarettes    Quit date: 07/28/2014    Years since quitting: 5.3  . Smokeless tobacco: Never Used  Substance and Sexual Activity  . Alcohol use: Yes    Alcohol/week: 0.0 standard drinks    Comment:  socially, variable  . Drug use: No  . Sexual activity: Not on file  Other Topics Concern  . Not on file  Social History Narrative   Patient lives at home with spouse [name]   Patient has 3 children    Patient is right handed    Social Determinants of Health   Financial Resource Strain:   . Difficulty of Paying Living Expenses: Not on file  Food Insecurity:   . Worried About Charity fundraiser in the Last Year: Not on file  . Ran Out of Food in the Last Year: Not on file  Transportation Needs:   . Lack of Transportation (Medical): Not on file  . Lack of Transportation (Non-Medical): Not on file  Physical Activity:   . Days of Exercise per Week: Not on file  . Minutes of Exercise per Session: Not on file  Stress:   . Feeling of Stress : Not on file  Social Connections:   . Frequency of Communication with Friends and Family: Not on file  .  Frequency of Social Gatherings with Friends and Family: Not on file  . Attends Religious Services: Not on file  . Active Member of Clubs or Organizations: Not on  file  . Attends Archivist Meetings: Not on file  . Marital Status: Not on file  Intimate Partner Violence:   . Fear of Current or Ex-Partner: Not on file  . Emotionally Abused: Not on file  . Physically Abused: Not on file  . Sexually Abused: Not on file   Current Outpatient Medications on File Prior to Visit  Medication Sig Dispense Refill  . acetaminophen (TYLENOL) 325 MG tablet Take 325 mg by mouth every 6 (six) hours as needed for moderate pain or headache.     [name] amLODipine (NORVASC) 5 MG tablet TAKE 1 TABLET ONCE DAILY. 28 tablet 0  . Ascorbic Acid (VITAMIN C PO) Take 1 tablet by mouth daily.    . AZOPT 1 % ophthalmic suspension     . bimatoprost (LUMIGAN) 0.01 % SOLN Place 1 drop at bedtime into both eyes.     [name] docusate sodium (COLACE) 100 MG capsule Take 100 mg by mouth at bedtime.     [name] ELIQUIS 5 MG TABS tablet TAKE 1 TABLET BY MOUTH TWICE DAILY. 60 tablet 0  . ferrous sulfate 325 (65 FE) MG tablet Take 325 mg by mouth at bedtime.     [name] FLUoxetine (PROZAC) 40 MG capsule Take 1 capsule (40 mg total) by mouth daily. 90 capsule 3  . furosemide (LASIX) 20 MG tablet Take 2 tablets (40 mg total) by mouth daily. Please call for office visit [phone] 60 tablet 0  . losartan (COZAAR) 25 MG tablet Take 1 tablet (25 mg total) by mouth daily. 30 tablet 5  . Magnesium 500 MG CAPS Take 500 mg by mouth daily.     . Melatonin 5 MG TABS Take 5 mg by mouth at bedtime.    . metoprolol succinate (TOPROL-XL) 25 MG 24 hr tablet Take 0.5 tablets (12.5 mg total) by mouth daily. Please call for office visit [phone] 14 tablet 0  . montelukast (SINGULAIR) 10 MG tablet TAKE ONE TABLET AT BEDTIME. 30 tablet 5  . Multiple Vitamins-Minerals (CENTRUM SILVER PO) Take 1 tablet by mouth daily.    . Multiple Vitamins-Minerals (ZINC PO) Take 1 tablet by mouth daily.    [name] omeprazole (PRILOSEC) 20 MG capsule Take 30- 60 min before your first and last meals of the day    . Polyethyl Glycol-Propyl  Glycol (SYSTANE) 0.4-0.3 % SOLN Place 1 drop into both eyes daily as needed (for dry eyes).     . potassium chloride SA (KLOR-CON) 20 MEQ tablet Take 1 tablet (20 mEq total) by mouth daily. Please call for office visit [phone] 30 tablet 0  . rosuvastatin (CRESTOR) 40 MG tablet TAKE 1 TABLET BY MOUTH DAILY. 90 tablet 0  . senna (SENOKOT) 8.6 MG tablet Take 1 tablet by mouth 2 (two) times daily.    . SYMBICORT 160-4.5 MCG/ACT inhaler USE 2 PUFFS TWICE DAILY. 10.2 g 5   No current facility-administered medications on file prior to visit.   Allergies  Allergen Reactions  . Niacin-Lovastatin Er Shortness Of Breath and Other (See Comments)    Dyspnea, flushing  . Penicillins Hives, Shortness Of Breath and Other (See Comments)    Flushing  & dyspnea Because of a history of documented adverse serious drug reaction;Medi Alert bracelet  is recommended PCN reaction causing immediate rash, facial/tongue/throat swelling, SOB or lightheadedness  with hypotension: Yes PCN reaction causing severe rash involving mucus membranes or skin necrosis: No PCN reaction occurring within the last 10 years: NO PCN reaction that required hospitalization: NO  . Sulfonamide Derivatives Hives, Shortness Of Breath and Other (See Comments)    Flushing & dyspnea Because of a history of documented adverse serious drug reaction;Medi Alert bracelet  is recommended  . Atorvastatin Other (See Comments)    Myalgias & athralgias- takes Crestor, DOES NOT LIKE LIPITOR   Family History  Problem Relation Age of Onset  . Stroke Mother        mini strokes  . Alcohol abuse Father   . Heart disease Father        MI after 104  . Stroke Father   . Hypertension Father   . Heart attack Father   . Heart disease Paternal Aunt        several  . Hypertension Paternal Aunt        several  . Stroke Paternal Aunt        several  . Stroke Paternal Uncle        several  . Heart disease Paternal Uncle        several;2 had MI pre 5   . Cancer Daughter 66       breast ca, also with benign sessile polyp     PE: BP 122/78   Pulse 78   Ht 6\' 1"  (1.854 m)   Wt 199 lb (90.3 kg)   SpO2 92%   BMI 26.25 kg/m  Wt Readings from Last 3 Encounters:  11/30/19 199 lb (90.3 kg)  11/25/19 196 lb (88.9 kg)  11/23/19 195 lb (88.5 kg)   Constitutional: overweight, in NAD Eyes: PERRLA, EOMI, no exophthalmos ENT: moist mucous membranes, no thyromegaly, no cervical lymphadenopathy Cardiovascular: irreg. Irreg. Rhythm, RR, No MRG Respiratory: CTA B Gastrointestinal: abdomen soft, NT, ND, BS+ Musculoskeletal: no deformities, strength intact in all 4 Skin: moist, warm, no rashes Neurological: + Significant tremor with outstretched hands, DTR normal in all 4  ASSESSMENT: 1.  Amiodarone-induced thyrotoxicosis (AIT)  PLAN:  1. Patient history of AIT, without thyrotoxic symptoms other than possibly weight loss (which he mentions, was intentional).  He denied heat intolerance, palpitations, anxiety, hyper defecation.  He does have a history of PAF and is on amiodarone.  Around the time of his initial investigation for thyrotoxicosis he was also taking a high dose of biotin, which can cause of thyrotoxicosis like picture, however, the thyroid tests were abnormal even after stopping the supplement. -We initially started methimazole and then also added prednisone 10 mg daily.  He did well afterwards and we were able to decrease the dose of prednisone and methimazole.  We stopped prednisone completely 10/2018.  We stop methimazole completely in 05/2019.  However, he did not return for repeat set of labs approximately 1 month after stopping methimazole. -He continues on Toprol-XL 25 mg daily for his A. fib -At this visit, he has no thyrotoxic symptoms; he had vertigo recently, unlikely to be related to the previous thyrotoxicosis -We will recheck his TFTs to see if we need to restart methimazole or continue off the medication -I will see him  back in 6 months but probably sooner for labs.  I did advise him about the importance of following up for labs.  We will also check his BMP for potassium - per PCP.  Component     Latest Ref Rng & Units 11/30/2019  Sodium     135 - 145 mEq/L 138  Potassium     3.5 - 5.1 mEq/L 4.5  Chloride     96 - 112 mEq/L 102  CO2     19 - 32 mEq/L 28  Glucose     70 - 99 mg/dL 105 (H)  BUN     6 - 23 mg/dL 15  Creatinine     0.40 - 1.50 mg/dL 1.28  Calcium     8.4 - 10.5 mg/dL 9.4  GFR     >60.00 mL/min 54.19 (L)  TSH     0.35 - 4.50 uIU/mL 2.48  Triiodothyronine,Free,Serum     2.3 - 4.2 pg/mL 3.4  T4,Free(Direct)     0.60 - 1.60 ng/dL 1.12  TFTs normal.  If they remain normal at next visit, she can continue to follow-up with PCP with only yearly TFTs from then on.  Philemon Kingdom, MD PhD [facility] Endocrinology

## 2019-11-30 NOTE — Patient Instructions (Signed)
Please continue off methimazole.  Please stop at the lab.  Please come back for a follow-up appointment in 6 months.

## 2019-12-01 ENCOUNTER — Telehealth: Payer: Self-pay | Admitting: Internal Medicine

## 2019-12-01 DIAGNOSIS — R251 Tremor, unspecified: Secondary | ICD-10-CM | POA: Diagnosis not present

## 2019-12-01 DIAGNOSIS — H8110 Benign paroxysmal vertigo, unspecified ear: Secondary | ICD-10-CM | POA: Diagnosis not present

## 2019-12-01 DIAGNOSIS — H9113 Presbycusis, bilateral: Secondary | ICD-10-CM | POA: Diagnosis not present

## 2019-12-01 DIAGNOSIS — I482 Chronic atrial fibrillation, unspecified: Secondary | ICD-10-CM | POA: Diagnosis not present

## 2019-12-01 LAB — BASIC METABOLIC PANEL
BUN: 15 mg/dL (ref 6–23)
CO2: 28 mEq/L (ref 19–32)
Calcium: 9.4 mg/dL (ref 8.4–10.5)
Chloride: 102 mEq/L (ref 96–112)
Creatinine, Ser: 1.28 mg/dL (ref 0.40–1.50)
GFR: 54.19 mL/min — ABNORMAL LOW (ref >60.00)
Glucose, Bld: 105 mg/dL — ABNORMAL HIGH (ref 70–99)
Potassium: 4.5 mEq/L (ref 3.5–5.1)
Sodium: 138 mEq/L (ref 135–145)

## 2019-12-01 LAB — T4, FREE: Free T4: 1.12 ng/dL (ref 0.60–1.60)

## 2019-12-01 LAB — TSH: TSH: 2.48 u[IU]/mL (ref 0.35–4.50)

## 2019-12-01 LAB — T3, FREE: T3, Free: 3.4 pg/mL (ref 2.3–4.2)

## 2019-12-01 NOTE — Progress Notes (Signed)
ILR Remote 

## 2019-12-01 NOTE — Telephone Encounter (Signed)
DPR ok to lmom. Left message in regards to Eliquis. Will need to hold Eliquis the day before procedure and the day of procedure and resume ASAP after procedure. I did ask for a call back to [phone] to let us know the pt did get the message our office left about holding the Eliquis.

## 2019-12-01 NOTE — Telephone Encounter (Signed)
Wife calling in to speak with someone in device clinic. States that her husbands doctor believed he was going into afib so advised him to send in a transmission via loop recorder. She was calling to check transmission to see if he was in afib. Please advise.

## 2019-12-01 NOTE — Telephone Encounter (Signed)
Callback pool, Please inform the patient

## 2019-12-01 NOTE — Telephone Encounter (Signed)
Spoke with pt spouse, advised transmission received.  She reports pt was at ENT today, the MD felt he might be going into AF and recommended sending transmission.  She reports pt is asymptomatic.  Advised no true AF episodes today.  Pt having frequent PACs

## 2019-12-01 NOTE — Telephone Encounter (Signed)
Transmission received 12-01-2019

## 2019-12-02 DIAGNOSIS — H8110 Benign paroxysmal vertigo, unspecified ear: Secondary | ICD-10-CM | POA: Insufficient documentation

## 2019-12-02 DIAGNOSIS — H9113 Presbycusis, bilateral: Secondary | ICD-10-CM | POA: Insufficient documentation

## 2019-12-02 DIAGNOSIS — R251 Tremor, unspecified: Secondary | ICD-10-CM | POA: Insufficient documentation

## 2019-12-02 NOTE — Telephone Encounter (Signed)
[name] - FYI. Please move forward with scheduling. Thanks. GM

## 2019-12-02 NOTE — Telephone Encounter (Signed)
DPR ok to s/w pt's wife who has been made aware pt will need to hold hi Eliquis 1 day prior to colonoscopy and resume right after procedure once surgeon feels it is safe to resume. Pt's wife thanked me for the call. I will remove from the pre op call back pool.

## 2019-12-07 DIAGNOSIS — G4733 Obstructive sleep apnea (adult) (pediatric): Secondary | ICD-10-CM | POA: Diagnosis not present

## 2019-12-09 ENCOUNTER — Emergency Department (HOSPITAL_COMMUNITY)
Admission: EM | Admit: 2019-12-09 | Discharge: 2019-12-09 | Disposition: A | Discharge status: Home / Self Care | Payer: PPO | Attending: Emergency Medicine | Admitting: Emergency Medicine

## 2019-12-09 ENCOUNTER — Other Ambulatory Visit: Payer: Self-pay

## 2019-12-09 ENCOUNTER — Encounter (HOSPITAL_COMMUNITY): Payer: Self-pay | Admitting: Emergency Medicine

## 2019-12-09 ENCOUNTER — Emergency Department (HOSPITAL_COMMUNITY): Payer: PPO

## 2019-12-09 ENCOUNTER — Ambulatory Visit: Payer: PPO | Admitting: Psychiatry

## 2019-12-09 ENCOUNTER — Telehealth: Payer: Self-pay

## 2019-12-09 DIAGNOSIS — R072 Precordial pain: Secondary | ICD-10-CM | POA: Diagnosis not present

## 2019-12-09 DIAGNOSIS — Z85828 Personal history of other malignant neoplasm of skin: Secondary | ICD-10-CM | POA: Insufficient documentation

## 2019-12-09 DIAGNOSIS — N183 Chronic kidney disease, stage 3 unspecified: Secondary | ICD-10-CM | POA: Diagnosis not present

## 2019-12-09 DIAGNOSIS — Z85038 Personal history of other malignant neoplasm of large intestine: Secondary | ICD-10-CM | POA: Diagnosis not present

## 2019-12-09 DIAGNOSIS — Z79899 Other long term (current) drug therapy: Secondary | ICD-10-CM | POA: Diagnosis not present

## 2019-12-09 DIAGNOSIS — Z95818 Presence of other cardiac implants and grafts: Secondary | ICD-10-CM | POA: Insufficient documentation

## 2019-12-09 DIAGNOSIS — J449 Chronic obstructive pulmonary disease, unspecified: Secondary | ICD-10-CM | POA: Diagnosis not present

## 2019-12-09 DIAGNOSIS — R109 Unspecified abdominal pain: Secondary | ICD-10-CM

## 2019-12-09 DIAGNOSIS — Z87891 Personal history of nicotine dependence: Secondary | ICD-10-CM | POA: Insufficient documentation

## 2019-12-09 DIAGNOSIS — R0602 Shortness of breath: Secondary | ICD-10-CM | POA: Insufficient documentation

## 2019-12-09 DIAGNOSIS — I5032 Chronic diastolic (congestive) heart failure: Secondary | ICD-10-CM | POA: Diagnosis not present

## 2019-12-09 DIAGNOSIS — J90 Pleural effusion, not elsewhere classified: Secondary | ICD-10-CM | POA: Diagnosis not present

## 2019-12-09 DIAGNOSIS — R1010 Upper abdominal pain, unspecified: Secondary | ICD-10-CM | POA: Diagnosis present

## 2019-12-09 DIAGNOSIS — J9811 Atelectasis: Secondary | ICD-10-CM | POA: Diagnosis not present

## 2019-12-09 DIAGNOSIS — K219 Gastro-esophageal reflux disease without esophagitis: Secondary | ICD-10-CM | POA: Diagnosis not present

## 2019-12-09 DIAGNOSIS — Z85118 Personal history of other malignant neoplasm of bronchus and lung: Secondary | ICD-10-CM | POA: Diagnosis not present

## 2019-12-09 DIAGNOSIS — Z902 Acquired absence of lung [part of]: Secondary | ICD-10-CM | POA: Insufficient documentation

## 2019-12-09 DIAGNOSIS — R1011 Right upper quadrant pain: Secondary | ICD-10-CM | POA: Diagnosis not present

## 2019-12-09 DIAGNOSIS — I130 Hypertensive heart and chronic kidney disease with heart failure and stage 1 through stage 4 chronic kidney disease, or unspecified chronic kidney disease: Secondary | ICD-10-CM | POA: Insufficient documentation

## 2019-12-09 DIAGNOSIS — Z7901 Long term (current) use of anticoagulants: Secondary | ICD-10-CM | POA: Insufficient documentation

## 2019-12-09 LAB — BASIC METABOLIC PANEL WITH GFR
Anion gap: 11 (ref 5–15)
BUN: 14 mg/dL (ref 8–23)
CO2: 24 mmol/L (ref 22–32)
Calcium: 9.5 mg/dL (ref 8.9–10.3)
Chloride: 103 mmol/L (ref 98–111)
Creatinine, Ser: 1.19 mg/dL (ref 0.61–1.24)
GFR calc Af Amer: >60 mL/min
GFR calc non Af Amer: 58 mL/min — ABNORMAL LOW
Glucose, Bld: 162 mg/dL — ABNORMAL HIGH (ref 70–99)
Potassium: 4.5 mmol/L (ref 3.5–5.1)
Sodium: 138 mmol/L (ref 135–145)

## 2019-12-09 LAB — HEPATIC FUNCTION PANEL
ALT: 22 U/L (ref 0–44)
AST: 23 U/L (ref 15–41)
Albumin: 3.6 g/dL (ref 3.5–5.0)
Alkaline Phosphatase: 82 U/L (ref 38–126)
Bilirubin, Direct: 0.2 mg/dL (ref 0.0–0.2)
Indirect Bilirubin: 0.6 mg/dL (ref 0.3–0.9)
Total Bilirubin: 0.8 mg/dL (ref 0.3–1.2)
Total Protein: 7.3 g/dL (ref 6.5–8.1)

## 2019-12-09 LAB — CBC
HCT: 41.3 % (ref 39.0–52.0)
Hemoglobin: 13.2 g/dL (ref 13.0–17.0)
MCH: 30.4 pg (ref 26.0–34.0)
MCHC: 32 g/dL (ref 30.0–36.0)
MCV: 95.2 fL (ref 80.0–100.0)
Platelets: 301 10*3/uL (ref 150–400)
RBC: 4.34 MIL/uL (ref 4.22–5.81)
RDW: 14.7 % (ref 11.5–15.5)
WBC: 10.5 10*3/uL (ref 4.0–10.5)
nRBC: 0 % (ref 0.0–0.2)

## 2019-12-09 LAB — LIPASE, BLOOD: Lipase: 23 U/L (ref 11–51)

## 2019-12-09 LAB — TROPONIN I (HIGH SENSITIVITY)
Troponin I (High Sensitivity): 9 ng/L
Troponin I (High Sensitivity): 10 ng/L (ref <18)

## 2019-12-09 MED ORDER — ALUM & MAG HYDROXIDE-SIMETH 200-200-20 MG/5ML PO SUSP
15.0000 mL | Freq: Once | ORAL | Status: AC
  Administered 2019-12-09: 30 mL via ORAL
  Filled 2019-12-09: qty 30

## 2019-12-09 MED ORDER — SODIUM CHLORIDE 0.9% FLUSH: 3.0000 mL | Freq: Once | INTRAVENOUS | Status: DC

## 2019-12-09 MED ORDER — ACETAMINOPHEN 500 MG PO TABS
1000.0000 mg | ORAL_TABLET | Freq: Once | ORAL | Status: AC
  Administered 2019-12-09: 1000 mg via ORAL
  Filled 2019-12-09: qty 2

## 2019-12-09 MED ORDER — FAMOTIDINE 20 MG PO TABS
20.0000 mg | ORAL_TABLET | Freq: Once | ORAL | Status: AC
  Administered 2019-12-09: 20 mg via ORAL
  Filled 2019-12-09: qty 1

## 2019-12-09 NOTE — ED Provider Notes (Addendum)
[facility] Provider Note   CSN: [id number] Arrival date & time: 12/09/19  1036     History Chief Complaint  Patient presents with  . Nausea  . Chest Pain  . Shortness of Breath    Troy Heppler [name] is a 79 y.o. male.  Patient c/o pain to upper abd/lower chest. Symptoms acute onset last pm, at rest, dull, constant, radiated to back and to lower chest, moderate, constant. +nausea, single episode of emesis, not bloody or bilious. No associated sob, or diaphoresis. No recent exertional cp or discomfort. No unusual doe or fatigue. No abd distension. Having normal bms, no diarrhea or constipation. No fever or chills. No cough or uri symptoms.   The history is provided by the patient.  Chest Pain Associated symptoms: abdominal pain, nausea, shortness of breath and vomiting   Associated symptoms: no cough, no fever, no headache and no palpitations   Shortness of Breath Associated symptoms: abdominal pain, chest pain and vomiting   Associated symptoms: no cough, no fever, no headaches, no neck pain, no rash and no sore throat        Past Medical History:  Diagnosis Date  . AAA (abdominal aortic aneurysm) ([place]) LAST ABDOMINAL US 10-20-17 3.3 CM   MONITORED BY DR [name]  . Anxiety   . Arthritis   . Atrial fibrillation ([place])   . Basal cell carcinoma   . Bilateral carotid artery stenosis DUPLEX 12-29-2012  BY DR [facility]   BILATERAL ICA STENOSIS 60-79%  . BPH (benign prostatic hypertrophy)   . Chronic diastolic heart failure ([place])   . Chronic kidney disease    stage 3, pt unaware  . Colon cancer ([place])   . Complication of anesthesia    had nausea after carotid artery surgery, states the medicine given to help the nausea, made it worse  . COPD (chronic obstructive pulmonary disease) ([place])   . Depression   . GERD (gastroesophageal reflux disease)   . Glaucoma BOTH EYES   Dr Gershon Crane  . History of basal cell carcinoma excision   . History  of lung cancer APRIL 2012  SQUAMOUS CELL---- S/P RIGHT LOWER LOBECTOMY AT [facility] --  NO CHEMORADIATION---  NO RECURRENCE    ONCOLOGIST- DR Tressie Stalker  LOV IN [facility] 10-27-2012  . History of pneumothorax    pt unaware  . Hx of adenomatous colonic polyps 2005    X 2; 1 hyperplastic polyp; Dr Olevia Perches  . Hyperlipidemia   . Hypertension   . Impaired fasting glucose 2007   108; A1c5.4%  . Lesion of bladder   . Lung cancer ([place]) 01/25/2012  . Microhematuria   . OSA (obstructive sleep apnea) 08/29/2015   CPAP SET ON 10  . PAD (peripheral artery disease) ([place]) ABI'S  JAN 2014  0.65 ON RIGHT ;  1.04 ON LEFT  . Peripheral vascular disease ([place]) S/P ANGIOPLASTY AND STENTING   FOLLOWED  BY DR [name]  . Pleural effusion on right 11/18/2018   Chronic, noted on CXR  . Spinal stenosis Sept. 2015  . Status post placement of implantable loop recorder   . Stroke [facility]) Jul 07, 2013   mini  TIA  . Thoracic aorta atherosclerosis ([place])   . Thyrotoxicosis    amiodarone induced  . Urgency of urination     Patient Active Problem List   Diagnosis Date Noted  . Vertigo 11/25/2019  . Cancer of sigmoid colon ([place]) 12/12/2018  . Pleural effusion on right 07/15/2018  .  Disease of lymph node   . Colon adenocarcinoma ([place]) 06/30/2018  . Acute GI bleeding 06/23/2018  . Depression 05/06/2018  . Syncope 01/29/2018  . Chronic diastolic CHF (congestive heart failure) ([place]) 05/04/2017  . CAD (coronary artery disease) 05/03/2017  . MGUS (monoclonal gammopathy of unknown significance) 03/07/2016  . Benign essential HTN 01/19/2016  . OSA (obstructive sleep apnea) 08/29/2015  . PAF (paroxysmal atrial fibrillation) ([place])   . Glaucoma 06/22/2015  . DOE (dyspnea on exertion) 02/04/2015  . Memory loss, short term 08/03/2014  . Anemia, unspecified 12/28/2013  . Occlusion and stenosis of carotid artery without mention of cerebral infarction 07/09/2013  . COPD GOLD II  07/09/2013  . Chronic renal disease, stage 3,  moderately decreased glomerular filtration rate between 30-59 mL/min/1.73 square meter (HCC) 07/06/2013  . TIA (transient ischemic attack) 07/06/2013  . Basal cell carcinoma of skin 10/10/2012  . History of lung cancer 03/11/2012  . PAD (peripheral artery disease) ([place]) 12/16/2011  . AAA (abdominal aortic aneurysm) ([place]) 09/27/2011  . Squamous cell lung cancer ([place]) 11/08/2010  . GAIT IMBALANCE 03/02/2009  . Prediabetes 03/02/2009  . COLONIC POLYPS, HX OF 03/02/2009  . OBSTRUCTIVE CHRONIC BRONCHITIS WITHOUT EXACERBAT 01/15/2008  . HYPERPLASIA PROSTATE UNS W/O UR OBST & OTH LUTS 01/15/2008  . HYPERLIPIDEMIA 09/29/2007  . Peripheral vascular disease ([place]) 09/29/2007  . LEUKOPLAKIA, VOCAL CORDS 09/29/2007  . FEMORAL BRUIT 09/29/2007    Past Surgical History:  Procedure Laterality Date  . ANGIO PLASTY     X 4 in legs  . AORTOGRAM  07-27-2002   MILD DIFFUSE ILIAC ARTERY OCCLUSIVE DISEASE /  LEFT RENAL ARTERY 20%/ PATENT LEFT FEM-POP GRAFT/ MILD SFA AND POPLITEAL ARTERY OCCLUSIVE DISEASE W/ SEVERE KIDNEY OCCLUSIVE DISEASE  . ATRIAL FIBRILLATION ABLATION N/A 04/09/2019   Procedure: ATRIAL FIBRILLATION ABLATION;  Surgeon: Thompson Grayer, MD;  Location: [place] CV LAB;  Service: Cardiovascular;  Laterality: N/A;  . BASAL CELL CARCINOMA EXCISION     MULTIPLE TIMES--  RIGHT FOREARM, CHEEKS, AND BACK  . BIOPSY  06/25/2018   Procedure: BIOPSY;  Surgeon: Rush Landmark Telford Nab., MD;  Location: [place];  Service: Gastroenterology;;  . CARDIOVASCULAR STRESS TEST  12-08-2012  DR MCLEAN   NORMAL LEXISCAN WITH NO EXERCISE NUCLEAR STUDY/ EF 66%/   NO ISCHEMIA/ NO SIGNIFICANT CHANGE FROM PRIOR STUDY  . CARDIOVERSION N/A 02/14/2018   Procedure: CARDIOVERSION;  Surgeon: Larey Dresser, MD;  Location: [facility] ENDOSCOPY;  Service: Cardiovascular;  Laterality: N/A;  . CAROTID ANGIOGRAM N/A 07/10/2013   Procedure: CAROTID ANGIOGRAM;  Surgeon: Elam Dutch, MD;  Location: [facility] CATH LAB;  Service:  Cardiovascular;  Laterality: N/A;  . CAROTID ENDARTERECTOMY Right 07-14-13   cea  . CATARACT EXTRACTION W/ INTRAOCULAR LENS  IMPLANT, BILATERAL    . colon polyectomy    . COLONOSCOPY WITH PROPOFOL N/A 06/25/2018   Procedure: COLONOSCOPY WITH PROPOFOL;  Surgeon: Rush Landmark Telford Nab., MD;  Location: [place];  Service: Gastroenterology;  Laterality: N/A;  . CYSTOSCOPY W/ RETROGRADES Bilateral 01/21/2013   Procedure: CYSTOSCOPY WITH RETROGRADE PYELOGRAM;  Surgeon: Molli Hazard, MD;  Location: [facility];  Service: Urology;  Laterality: Bilateral;   CYSTO, BLADDER BIOPSY, BILATERAL RETROGRADE PYELOGRAM  RAD TECH FROM RADIOLOGY PER JOY  . CYSTOSCOPY WITH BIOPSY N/A 01/21/2013   Procedure: CYSTOSCOPY WITH BIOPSY;  Surgeon: Molli Hazard, MD;  Location: [facility];  Service: Urology;  Laterality: N/A;  . ENDARTERECTOMY Right 07/14/2013   Procedure: ENDARTERECTOMY CAROTID;  Surgeon: Judeth Cornfield  [name], MD;  Location: [place];  Service: Vascular;  Laterality: Right;  . EP IMPLANTABLE DEVICE N/A 08/12/2015   Procedure: Loop Recorder Insertion;  Surgeon: Thompson Grayer, MD;  Location: [employer] CV LAB;  Service: Cardiovascular;  Laterality: N/A;  . ESOPHAGOGASTRODUODENOSCOPY (EGD) WITH PROPOFOL N/A 06/25/2018   Procedure: ESOPHAGOGASTRODUODENOSCOPY (EGD) WITH PROPOFOL;  Surgeon: Rush Landmark Telford Nab., MD;  Location: [place];  Service: Gastroenterology;  Laterality: N/A;  . ESOPHAGOGASTRODUODENOSCOPY (EGD) WITH PROPOFOL N/A 07/10/2018   Procedure: ESOPHAGOGASTRODUODENOSCOPY (EGD) WITH PROPOFOL;  Surgeon: Milus Banister, MD;  Location: [facility] ENDOSCOPY;  Service: Endoscopy;  Laterality: N/A;  . EUS N/A 07/10/2018   Procedure: UPPER ENDOSCOPIC ULTRASOUND (EUS) RADIAL;  Surgeon: Milus Banister, MD;  Location: [facility] ENDOSCOPY;  Service: Endoscopy;  Laterality: N/A;  . EYE SURGERY Right   . FEMORAL-POPLITEAL BYPASS GRAFT Left [facility]   AND  2001  IN [place]  . FEMORAL-POPLITEAL BYPASS GRAFT Left 08/30/2015   Procedure: REVISION OF BYPASS GRAFT Left  FEMORAL-POPLITEAL ARTERY;  Surgeon: Angelia Mould, MD;  Location: [place];  Service: Vascular;  Laterality: Left;  . FINE NEEDLE ASPIRATION N/A 07/10/2018   Procedure: FINE NEEDLE ASPIRATION (FNA) LINEAR;  Surgeon: Milus Banister, MD;  Location: [facility] ENDOSCOPY;  Service: Endoscopy;  Laterality: N/A;  . FLEXIBLE SIGMOIDOSCOPY N/A 12/12/2018   Procedure: FLEXIBLE SIGMOIDOSCOPY;  Surgeon: Ileana Roup, MD;  Location: [facility] ORS;  Service: General;  Laterality: N/A;  . LARYNGOSCOPY  06-27-2004   BX VOCAL CORD  (LEUKOPLAKIA)  PER PT NO ISSUES SINCE  . LEFT HEART CATH AND CORONARY ANGIOGRAPHY N/A 08/20/2017   Procedure: LEFT HEART CATH AND CORONARY ANGIOGRAPHY;  Surgeon: Larey Dresser, MD;  Location: [place] CV LAB;  Service: Cardiovascular;  Laterality: N/A;  . LOOP RECORDER INSERTION N/A 04/09/2019   Procedure: LOOP RECORDER INSERTION;  Surgeon: Thompson Grayer, MD;  Location: [place] CV LAB;  Service: Cardiovascular;  Laterality: N/A;  . LOOP RECORDER REMOVAL N/A 04/09/2019   Procedure: LOOP RECORDER REMOVAL;  Surgeon: Thompson Grayer, MD;  Location: [place] CV LAB;  Service: Cardiovascular;  Laterality: N/A;  . LOWER EXTREMITY ANGIOGRAM Bilateral 08/29/2015   Procedure: Lower Extremity Angiogram;  Surgeon: Angelia Mould, MD;  Location: [place] CV LAB;  Service: Cardiovascular;  Laterality: Bilateral;  . LUNG LOBECTOMY  01/24/2011    RIGHT UPPER LOBE  (SQUAMOUS CELL CARCINOMA) Dr Dorthea Cove , [facility]. No chemotherapyor radiation  . PATCH ANGIOPLASTY Right 07/14/2013   Procedure: PATCH ANGIOPLASTY;  Surgeon: Angelia Mould, MD;  Location: [place];  Service: Vascular;  Laterality: Right;  . PATCH ANGIOPLASTY Left 08/30/2015   Procedure: VEIN PATCH ANGIOPLASTY OF PROXIMAL Left BYPASS GRAFT;  Surgeon: Angelia Mould, MD;  Location: [place];  Service: Vascular;   Laterality: Left;  . PERIPHERAL VASCULAR CATHETERIZATION N/A 08/29/2015   Procedure: Abdominal Aortogram;  Surgeon: Angelia Mould, MD;  Location: [place] CV LAB;  Service: Cardiovascular;  Laterality: N/A;  . POLYPECTOMY  06/25/2018   Procedure: POLYPECTOMY;  Surgeon: Rush Landmark Telford Nab., MD;  Location: [place];  Service: Gastroenterology;;  . Lia Foyer INJECTION  06/25/2018   Procedure: SUBMUCOSAL INJECTION;  Surgeon: Irving Copas., MD;  Location: [place];  Service: Gastroenterology;;  . TEE WITHOUT CARDIOVERSION N/A 02/14/2018   Procedure: TRANSESOPHAGEAL ECHOCARDIOGRAM (TEE);  Surgeon: Larey Dresser, MD;  Location: [facility] ENDOSCOPY;  Service: Cardiovascular;  Laterality: N/A;  . trabecular surgery     OS  . TRANSTHORACIC ECHOCARDIOGRAM  12-29-2012  DR [facility]   MILD LVH/  LVSF NORMAL/ EF 28-41%/  GRADE I DIASTOLIC DYSFUNCTION       Family History  Problem Relation Age of Onset  . Stroke Mother        mini strokes  . Alcohol abuse Father   . Heart disease Father        MI after 62  . Stroke Father   . Hypertension Father   . Heart attack Father   . Heart disease Paternal Aunt        several  . Hypertension Paternal Aunt        several  . Stroke Paternal Aunt        several  . Stroke Paternal Uncle        several  . Heart disease Paternal Uncle        several;2 had MI pre 51  . Cancer Daughter 61       breast ca, also with benign sessile polyp     Social History   Tobacco Use  . Smoking status: Former Smoker    Packs/day: 0.50    Years: 50.00    Pack years: 25.00    Types: Cigarettes    Quit date: 07/28/2014    Years since quitting: 5.3  . Smokeless tobacco: Never Used  Substance Use Topics  . Alcohol use: Yes    Alcohol/week: 0.0 standard drinks    Comment:  socially, variable  . Drug use: No    Home Medications Prior to Admission medications   Medication Sig Start Date End Date Taking? Authorizing Provider  acetaminophen  (TYLENOL) 325 MG tablet Take 325 mg by mouth every 6 (six) hours as needed for moderate pain or headache.     [provider]  amLODipine (NORVASC) 5 MG tablet TAKE 1 TABLET ONCE DAILY. 11/26/19   Larey Dresser, MD  Ascorbic Acid (VITAMIN C PO) Take 1 tablet by mouth daily.    [provider]  AZOPT 1 % ophthalmic suspension  11/25/19   [provider]  bimatoprost (LUMIGAN) 0.01 % SOLN Place 1 drop at bedtime into both eyes.     [provider]  docusate sodium (COLACE) 100 MG capsule Take 100 mg by mouth at bedtime.     [provider]  ELIQUIS 5 MG TABS tablet TAKE 1 TABLET BY MOUTH TWICE DAILY. 11/26/19   Larey Dresser, MD  ferrous sulfate 325 (65 FE) MG tablet Take 325 mg by mouth at bedtime.     [provider]  FLUoxetine (PROZAC) 40 MG capsule Take 1 capsule (40 mg total) by mouth daily. 07/09/19   Binnie Rail, MD  furosemide (LASIX) 20 MG tablet Take 2 tablets (40 mg total) by mouth daily. Please call for office visit [phone] 11/26/19   Larey Dresser, MD  losartan (COZAAR) 25 MG tablet Take 1 tablet (25 mg total) by mouth daily. 11/25/19   Binnie Rail, MD  Magnesium 500 MG CAPS Take 500 mg by mouth daily.     [provider]  Melatonin 5 MG TABS Take 5 mg by mouth at bedtime.    [provider]  metoprolol succinate (TOPROL-XL) 25 MG 24 hr tablet Take 0.5 tablets (12.5 mg total) by mouth daily. Please call for office visit [phone] 11/26/19   Larey Dresser, MD  montelukast (SINGULAIR) 10 MG tablet TAKE ONE TABLET AT BEDTIME. 09/08/19   Tanda Rockers, MD  Multiple Vitamins-Minerals (CENTRUM SILVER PO) Take  1 tablet by mouth daily.    [provider]  Multiple Vitamins-Minerals (ZINC PO) Take 1 tablet by mouth daily.    [provider]  omeprazole (PRILOSEC) 20 MG capsule Take 30- 60 min before your first and last meals of the day 09/07/19   Tanda Rockers, MD  Polyethyl  Glycol-Propyl Glycol (SYSTANE) 0.4-0.3 % SOLN Place 1 drop into both eyes daily as needed (for dry eyes).     [provider]  potassium chloride SA (KLOR-CON) 20 MEQ tablet Take 1 tablet (20 mEq total) by mouth daily. Please call for office visit [phone] 11/26/19   Larey Dresser, MD  rosuvastatin (CRESTOR) 40 MG tablet TAKE 1 TABLET BY MOUTH DAILY. 01/26/19   Larey Dresser, MD  senna (SENOKOT) 8.6 MG tablet Take 1 tablet by mouth 2 (two) times daily.    [provider]  SYMBICORT 160-4.5 MCG/ACT inhaler USE 2 PUFFS TWICE DAILY. 09/08/19   Tanda Rockers, MD    Allergies    Niacin-lovastatin er, Penicillins, Sulfonamide derivatives, and Atorvastatin  Review of Systems   Review of Systems  Constitutional: Negative for fever.  HENT: Negative for sore throat.   Eyes: Negative for redness.  Respiratory: Positive for shortness of breath. Negative for cough.   Cardiovascular: Positive for chest pain. Negative for palpitations and leg swelling.  Gastrointestinal: Positive for abdominal pain, nausea and vomiting. Negative for constipation and diarrhea.  Genitourinary: Negative for dysuria and flank pain.  Musculoskeletal: Negative for neck pain.  Skin: Negative for rash.  Neurological: Negative for headaches.  Hematological: Does not bruise/bleed easily.  Psychiatric/Behavioral: Negative for confusion.    Physical Exam Updated Vital Signs BP (!) 161/74   Pulse 77   Temp 98.3 F (36.8 C)   Resp 15   SpO2 96%   Physical Exam Vitals and nursing note reviewed.  Constitutional:      Appearance: Normal appearance. He is well-developed.  HENT:     Head: Atraumatic.     Nose: Nose normal.     Mouth/Throat:     Mouth: Mucous membranes are moist.     Pharynx: Oropharynx is clear.  Eyes:     General: No scleral icterus.    Conjunctiva/sclera: Conjunctivae normal.  Neck:     Trachea: No tracheal deviation.  Cardiovascular:     Rate and Rhythm: Normal rate and  regular rhythm.     Pulses: Normal pulses.     Heart sounds: Normal heart sounds. No murmur. No friction rub. No gallop.   Pulmonary:     Effort: Pulmonary effort is normal. No accessory muscle usage or respiratory distress.     Breath sounds: Normal breath sounds.  Chest:     Chest wall: No tenderness.  Abdominal:     General: Bowel sounds are normal. There is no distension.     Palpations: Abdomen is soft. There is no mass.     Tenderness: There is abdominal tenderness. There is no guarding or rebound.     Hernia: No hernia is present.     Comments: Mild upper abd tenderness.   Genitourinary:    Comments: No cva tenderness. Musculoskeletal:        General: No swelling or tenderness.     Cervical back: Normal range of motion and neck supple. No rigidity.     Right lower leg: No edema.     Left lower leg: No edema.  Skin:    General: Skin is warm and dry.  Findings: No rash.  Neurological:     Mental Status: He is alert.     Comments: Alert, speech clear.   Psychiatric:        Mood and Affect: Mood normal.     ED Results / Procedures / Treatments   Labs (all labs ordered are listed, but only abnormal results are displayed) Results for orders placed or performed during the hospital encounter of 44/31/54  Basic metabolic panel  Result Value Ref Range   Sodium 138 135 - 145 mmol/L   Potassium 4.5 3.5 - 5.1 mmol/L   Chloride 103 98 - 111 mmol/L   CO2 24 22 - 32 mmol/L   Glucose, Bld 162 (H) 70 - 99 mg/dL   BUN 14 8 - 23 mg/dL   Creatinine, Ser 1.19 0.61 - 1.24 mg/dL   Calcium 9.5 8.9 - 10.3 mg/dL   GFR calc non Af Amer 58 (L) >60 mL/min   GFR calc Af Amer >60 >60 mL/min   Anion gap 11 5 - 15  CBC  Result Value Ref Range   WBC 10.5 4.0 - 10.5 K/uL   RBC 4.34 4.22 - 5.81 MIL/uL   Hemoglobin 13.2 13.0 - 17.0 g/dL   HCT 41.3 39.0 - 52.0 %   MCV 95.2 80.0 - 100.0 fL   MCH 30.4 26.0 - 34.0 pg   MCHC 32.0 30.0 - 36.0 g/dL   RDW 14.7 11.5 - 15.5 %   Platelets 301 150  - 400 K/uL   nRBC 0.0 0.0 - 0.2 %  Hepatic function panel  Result Value Ref Range   Total Protein 7.3 6.5 - 8.1 g/dL   Albumin 3.6 3.5 - 5.0 g/dL   AST 23 15 - 41 U/L   ALT 22 0 - 44 U/L   Alkaline Phosphatase 82 38 - 126 U/L   Total Bilirubin 0.8 0.3 - 1.2 mg/dL   Bilirubin, Direct 0.2 0.0 - 0.2 mg/dL   Indirect Bilirubin 0.6 0.3 - 0.9 mg/dL  Lipase, blood  Result Value Ref Range   Lipase 23 11 - 51 U/L  Troponin I (High Sensitivity)  Result Value Ref Range   Troponin I (High Sensitivity) 9 <18 ng/L  Troponin I (High Sensitivity)  Result Value Ref Range   Troponin I (High Sensitivity) 10 <18 ng/L   DG Chest 2 View  Result Date: 12/09/2019 CLINICAL DATA:  Left upper quadrant and left chest pain. Shortness of breath EXAM: CHEST - 2 VIEW COMPARISON:  11/23/2019 FINDINGS: Moderate right pleural effusion again noted, unchanged. Right base atelectasis. No confluent opacity on the left. Heart is normal size. No acute bony abnormality. IMPRESSION: Stable moderate right pleural effusion with right base atelectasis. Electronically Signed   By: Rolm Baptise M.D.   On: 12/09/2019 11:02     EKG EKG Interpretation  Date/Time:  Wednesday December 09 2019 10:45:08 EST Ventricular Rate:  76 PR Interval:  168 QRS Duration: 72 QT Interval:  396 QTC Calculation: 445 R Axis:   35 Text Interpretation: Normal sinus rhythm Nonspecific ST abnormality No significant change since last tracing Confirmed by Lajean Saver [phone]) on 12/09/2019 11:31:01 AM   Radiology DG Chest 2 View  Result Date: 12/09/2019 CLINICAL DATA:  Left upper quadrant and left chest pain. Shortness of breath EXAM: CHEST - 2 VIEW COMPARISON:  11/23/2019 FINDINGS: Moderate right pleural effusion again noted, unchanged. Right base atelectasis. No confluent opacity on the left. Heart is normal size. No acute bony abnormality. IMPRESSION: Stable moderate right  pleural effusion with right base atelectasis. Electronically Signed   By:  Rolm Baptise M.D.   On: 12/09/2019 11:02   US Abdomen Limited RUQ  Result Date: 12/09/2019 CLINICAL DATA:  Acute right upper quadrant abdominal pain. EXAM: ULTRASOUND ABDOMEN LIMITED RIGHT UPPER QUADRANT COMPARISON:  None. FINDINGS: Gallbladder: No gallstones or wall thickening visualized. No sonographic Murphy sign noted by sonographer. Common bile duct: Diameter: 8 mm which is within normal limits for age. Liver: No focal lesion identified. Within normal limits in parenchymal echogenicity. Portal vein is patent on color Doppler imaging with normal direction of blood flow towards the liver. Other: None. IMPRESSION: No definite abnormality seen in the right upper quadrant of the abdomen. Electronically Signed   By: Marijo Conception M.D.   On: 12/09/2019 13:06    Procedures Procedures (including critical care time)  Medications Ordered in ED Medications  sodium chloride flush (NS) 0.9 % injection 3 mL (3 mLs Intravenous Not Given 12/09/19 1132)    ED Course  I have reviewed the triage vital signs and the nursing notes.  Pertinent labs & imaging results that were available during my care of the patient were reviewed by me and considered in my medical decision making (see chart for details).    MDM Rules/Calculators/A&P                      Iv ns. Continuous pulse ox and monitor. Stat labs. Imaging ordered.   Reviewed nursing notes and prior charts for additional history. On prior imaging, pt was noted to have gallstones. Given upper abd pain, radiating to back, will get u/s today.   Labs reviewed/interpreted by me -  Initial trop normal.  CXR reviewed/interpreted by me - chr r effusion, no acute pna.  Additional labs reviewed/intepreted by me - lfts, lipase normal. Delta trop remains pending.  U/s reviewed/interpreted by me - no definite gallstones, no cholecystitis.   Acetaminophen, pepcid, maalox for symptom relief.   Additional labs reviewed/interpreted by me - delta trop normal.    Recheck, no cp or discomfort. No abd pain. abd soft nt.   Patient currently symptom free and appears stable for d/c.   Rec pcp and cardiology f/u.   Return precautions provided.       Final Clinical Impression(s) / ED Diagnoses Final diagnoses:  Abdominal pain    Rx / DC Orders ED Discharge Orders    None           Lajean Saver, MD 12/09/19 1555

## 2019-12-09 NOTE — ED Triage Notes (Signed)
Patient reports upper abdominal/chest pain, sob and nausea since last night. Denies fevers or chills. Reports BP was elevated at home in the 047T systolically. A/ox4, resp e/u, nad.

## 2019-12-09 NOTE — Discharge Instructions (Addendum)
It was our pleasure to provide your ER care today - we hope that you feel better.  If reflux symptoms, you may try pepcid or maalox as need.   You chest xray shows a persistent right effusion (also noted on your prior imaging) - follow up with your doctor.   For chest discomfort, follow up with cardiologist in 1 week - call office to arrange appointment.  Return to ER if worse, new symptoms, fevers, persistent/recurrent chest pain, increased trouble breathing, severe abdominal pain, persistent vomiting, or other concern.

## 2019-12-09 NOTE — Telephone Encounter (Signed)
New message    Was told to call back start of new medication  losartan (COZAAR) 25 MG tablet   1. BP readings  3.7.21-----152/66 3.8.21-----151/41 3.9.21---172/60 3.10.21---187/75 @ 8:30am 3.10.21--- 179/70 @ 9:30am   2. . What is your BP issue? Patient having stomach pain and chest pain,   Patient went to [facility] emergency room will be discharge shortly.

## 2019-12-09 NOTE — ED Notes (Signed)
Pt went to [name]

## 2019-12-10 ENCOUNTER — Telehealth: Payer: Self-pay | Admitting: Internal Medicine

## 2019-12-10 MED ORDER — OMEPRAZOLE 20 MG PO CPDR: 20.0000 mg | DELAYED_RELEASE_CAPSULE | ORAL | 1 refills | Status: DC

## 2019-12-10 MED ORDER — LOSARTAN POTASSIUM 25 MG PO TABS: 50.0000 mg | ORAL_TABLET | Freq: Every day | ORAL | 5 refills | Status: DC

## 2019-12-10 NOTE — Telephone Encounter (Signed)
    Pt c/o medication issue:  1. Name of Medication: omeprazole (PRILOSEC) 20 MG capsule  2. How are you currently taking this medication (dosage and times per day)? As written  3. Are you having a reaction (difficulty breathing--STAT)? no  4. What is your medication issue? Spouse calling to discuss increasing frequency of dose. Please call

## 2019-12-10 NOTE — Telephone Encounter (Signed)
Pts wife aware of response. 

## 2019-12-10 NOTE — Telephone Encounter (Signed)
Increase losartan to 50 mg daily.

## 2019-12-10 NOTE — Telephone Encounter (Signed)
Rx sent 

## 2019-12-10 NOTE — Telephone Encounter (Signed)
LVM for pt to call back.

## 2019-12-11 ENCOUNTER — Telehealth: Payer: Self-pay | Admitting: *Deleted

## 2019-12-11 NOTE — Telephone Encounter (Signed)
Patient recently had CXR in ER and asking if he still needs the CT scan in April. Informed her yes, that the CT scan is more sensitive that CXR.

## 2019-12-15 ENCOUNTER — Telehealth: Payer: Self-pay | Admitting: Internal Medicine

## 2019-12-15 MED ORDER — LOSARTAN POTASSIUM 25 MG PO TABS: 50.0000 mg | ORAL_TABLET | Freq: Every day | ORAL | 5 refills | Status: DC

## 2019-12-15 NOTE — Telephone Encounter (Signed)
Rx sent 

## 2019-12-15 NOTE — Telephone Encounter (Signed)
New message:   1.Medication Requested: losartan (COZAAR) 25 MG tablet 2. Pharmacy (Name, Street, [place]): [facility], [place] 3. On Med List: Yes  4. Last Visit with PCP:   5. Next visit date with PCP:11/25/19  Pt's wife states the patient was advised to take 2 pills at night and then need a new prescription sent over for that as well. Agent: Please be advised that RX refills may take up to 3 business days. We ask that you follow-up with your pharmacy.

## 2019-12-21 ENCOUNTER — Telehealth: Payer: Self-pay

## 2019-12-21 MED ORDER — OMEPRAZOLE 20 MG PO CPDR: 20.0000 mg | DELAYED_RELEASE_CAPSULE | ORAL | 1 refills | Status: DC

## 2019-12-21 NOTE — Telephone Encounter (Signed)
New message  The wife saying medication should say take two capsule a day   1.Medication Requested:omeprazole (PRILOSEC) 20 MG capsule    2. Pharmacy (Name, [place], City):[facility], [place]  3. On Med List: Yes   4. Last Visit with PCP: 2.24.2021   5. Next visit date with PCP: no appt made at this time    Agent: Please be advised that RX refills may take up to 3 business days. We ask that you follow-up with your pharmacy.

## 2019-12-21 NOTE — Telephone Encounter (Signed)
Rx sent 

## 2019-12-23 ENCOUNTER — Ambulatory Visit (INDEPENDENT_AMBULATORY_CARE_PROVIDER_SITE_OTHER): Payer: PPO | Admitting: Psychiatry

## 2019-12-23 ENCOUNTER — Other Ambulatory Visit: Payer: Self-pay

## 2019-12-23 DIAGNOSIS — F4321 Adjustment disorder with depressed mood: Secondary | ICD-10-CM | POA: Diagnosis not present

## 2019-12-23 NOTE — Progress Notes (Signed)
Psychotherapy Progress Note [facility] Luan Moore, PhD LP   Patient ID: Troy Kindred [name]     MRN: [id number] Therapy format: Individual psychotherapy Date: 12/23/2019      Start: 2:18p     Stop: 3:03p     Time Spent: 45 min Location: In-person   Session narrative (presenting needs, interim history, self-report of stressors and symptoms, applications of prior therapy, status changes, and interventions made in session) ED trip since last seen, suspects gall bladder issue, best idea that he was passing one or more stones.  Spent most of the day.  Lesson taken to be more careful about what he eats -- less fried food, primarily, to which he reacts when he eat it.  Glad it wasn't cardiac, though his chart does show CAD, CHF, AAA, and exertional dyspnea.  [place]  Anticipates enjoying the beach and grandchildren.  Continues to go to lunch with friends.  Concerned with news.  Mood rated about 7/10 lately.  Good day today with lunch with son, personal trainer visit earlier.  Tues/Thurs usually have an outing of some kind, tomorrow to the office for a bit, in c/o driver Legrand Como.  Occasionally will go grocery shopping with him.  Took him to his investment club meeting the other night, he was plenty welcome.  Legrand Como first acquainted as a Careers information officer, hired away for side work as [employer].  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  Politically differ but works very well for them to agree to disagree.  Looking for weather to get better so he can explore more activities.  Still would like to pick up cooking.    Has not looked into adaptive versions of golf and shooting.  Not necessarily as interested right now.  Request Pt ask wife if she has concerns to bring out for therapy.    Therapeutic modalities: Cognitive Behavioral Therapy and Solution-Oriented/Positive Psychology  Mental  Status/Observations:  Appearance:   Casual     Behavior:  Appropriate  Motor:  Shuffling Gait  Speech/Language:   Clear and Coherent  Affect:  Appropriate  Mood:  brighter  Thought process:  normal  Thought content:    WNL  Sensory/Perceptual disturbances:    WNL  Orientation:  Fully oriented  Attention:  Good  Concentration:  Good  Memory:  grossly intact  Insight:    Good  Judgment:   Good  Impulse Control:  Good   Risk Assessment: Danger to Self: No Self-injurious Behavior: No Danger to Others: No Physical Aggression / Violence: No Duty to Warn: No Access to Firearms a concern: No  Assessment of progress:  stabilized  Diagnosis:   ICD-10-CM   1. Adjustment disorder with depressed mood  F43.21    Plan:  . Encourage continue to look into adaptive versions of favorite activities  . Other recommendations/advice as may be noted above . Continue to utilize previously learned skills ad lib . Maintain medication as prescribed and work faithfully with relevant prescriber(s) if any changes are desired or seem indicated . Call the clinic on-call service, present to ER, or call 911 if any life-threatening psychiatric crisis Return in about 1 month (around 01/23/2020). . Already scheduled visit in this office 01/06/2020.  Troy Serve, PhD Luan Moore, PhD LP Clinical Psychologist, [facility] [name] [place], [place] [address] [phone]

## 2019-12-24 ENCOUNTER — Other Ambulatory Visit: Payer: Self-pay

## 2019-12-24 ENCOUNTER — Telehealth: Payer: Self-pay

## 2019-12-24 MED ORDER — OMEPRAZOLE 20 MG PO CPDR: 20.0000 mg | DELAYED_RELEASE_CAPSULE | ORAL | 1 refills | Status: DC

## 2019-12-31 ENCOUNTER — Other Ambulatory Visit (HOSPITAL_COMMUNITY): Payer: Self-pay | Admitting: Cardiology

## 2019-12-31 ENCOUNTER — Other Ambulatory Visit: Payer: Self-pay

## 2019-12-31 DIAGNOSIS — I509 Heart failure, unspecified: Secondary | ICD-10-CM

## 2019-12-31 DIAGNOSIS — R0602 Shortness of breath: Secondary | ICD-10-CM

## 2019-12-31 MED ORDER — OMEPRAZOLE 20 MG PO CPDR: 20.0000 mg | DELAYED_RELEASE_CAPSULE | Freq: Two times a day (BID) | ORAL | 1 refills | Status: DC

## 2020-01-04 ENCOUNTER — Other Ambulatory Visit (HOSPITAL_COMMUNITY): Payer: PPO

## 2020-01-04 ENCOUNTER — Other Ambulatory Visit: Payer: PPO

## 2020-01-04 ENCOUNTER — Ambulatory Visit (INDEPENDENT_AMBULATORY_CARE_PROVIDER_SITE_OTHER): Payer: PPO | Admitting: *Deleted

## 2020-01-04 DIAGNOSIS — R55 Syncope and collapse: Secondary | ICD-10-CM

## 2020-01-05 ENCOUNTER — Ambulatory Visit: Payer: PPO | Admitting: Oncology

## 2020-01-05 LAB — CUP PACEART REMOTE DEVICE CHECK
Date Time Interrogation Session: 20210401014242
Implantable Pulse Generator Implant Date: 20200709

## 2020-01-05 NOTE — Telephone Encounter (Signed)
Pt is scheduled for office visit on 01/07/2020 at pt's request.

## 2020-01-06 ENCOUNTER — Encounter (HOSPITAL_COMMUNITY): Payer: PPO | Admitting: Cardiology

## 2020-01-06 ENCOUNTER — Ambulatory Visit (HOSPITAL_COMMUNITY)
Admission: RE | Admit: 2020-01-06 | Discharge: 2020-01-06 | Disposition: A | Discharge status: Home / Self Care | Payer: PPO | Source: Ambulatory Visit | Attending: Cardiology | Admitting: Cardiology

## 2020-01-06 ENCOUNTER — Encounter (HOSPITAL_COMMUNITY): Payer: Self-pay | Admitting: Cardiology

## 2020-01-06 ENCOUNTER — Ambulatory Visit: Payer: PPO | Admitting: Psychiatry

## 2020-01-06 ENCOUNTER — Other Ambulatory Visit: Payer: Self-pay

## 2020-01-06 VITALS — BP 118/54 | HR 68 | Wt 199.0 lb

## 2020-01-06 DIAGNOSIS — Z85118 Personal history of other malignant neoplasm of bronchus and lung: Secondary | ICD-10-CM | POA: Insufficient documentation

## 2020-01-06 DIAGNOSIS — I480 Paroxysmal atrial fibrillation: Secondary | ICD-10-CM

## 2020-01-06 DIAGNOSIS — E785 Hyperlipidemia, unspecified: Secondary | ICD-10-CM | POA: Diagnosis not present

## 2020-01-06 DIAGNOSIS — I714 Abdominal aortic aneurysm, without rupture: Secondary | ICD-10-CM | POA: Diagnosis not present

## 2020-01-06 DIAGNOSIS — Z8673 Personal history of transient ischemic attack (TIA), and cerebral infarction without residual deficits: Secondary | ICD-10-CM | POA: Insufficient documentation

## 2020-01-06 DIAGNOSIS — I2510 Atherosclerotic heart disease of native coronary artery without angina pectoris: Secondary | ICD-10-CM | POA: Insufficient documentation

## 2020-01-06 DIAGNOSIS — Z902 Acquired absence of lung [part of]: Secondary | ICD-10-CM | POA: Insufficient documentation

## 2020-01-06 DIAGNOSIS — E782 Mixed hyperlipidemia: Secondary | ICD-10-CM | POA: Diagnosis not present

## 2020-01-06 DIAGNOSIS — Z79899 Other long term (current) drug therapy: Secondary | ICD-10-CM | POA: Insufficient documentation

## 2020-01-06 DIAGNOSIS — Z7951 Long term (current) use of inhaled steroids: Secondary | ICD-10-CM | POA: Diagnosis not present

## 2020-01-06 DIAGNOSIS — Z85038 Personal history of other malignant neoplasm of large intestine: Secondary | ICD-10-CM | POA: Diagnosis not present

## 2020-01-06 DIAGNOSIS — N183 Chronic kidney disease, stage 3 unspecified: Secondary | ICD-10-CM | POA: Insufficient documentation

## 2020-01-06 DIAGNOSIS — I5032 Chronic diastolic (congestive) heart failure: Secondary | ICD-10-CM

## 2020-01-06 DIAGNOSIS — Z87891 Personal history of nicotine dependence: Secondary | ICD-10-CM | POA: Diagnosis not present

## 2020-01-06 DIAGNOSIS — G4733 Obstructive sleep apnea (adult) (pediatric): Secondary | ICD-10-CM | POA: Diagnosis not present

## 2020-01-06 DIAGNOSIS — J449 Chronic obstructive pulmonary disease, unspecified: Secondary | ICD-10-CM | POA: Insufficient documentation

## 2020-01-06 DIAGNOSIS — J90 Pleural effusion, not elsewhere classified: Secondary | ICD-10-CM | POA: Diagnosis not present

## 2020-01-06 DIAGNOSIS — I130 Hypertensive heart and chronic kidney disease with heart failure and stage 1 through stage 4 chronic kidney disease, or unspecified chronic kidney disease: Secondary | ICD-10-CM | POA: Diagnosis not present

## 2020-01-06 DIAGNOSIS — I4891 Unspecified atrial fibrillation: Secondary | ICD-10-CM

## 2020-01-06 DIAGNOSIS — Z7901 Long term (current) use of anticoagulants: Secondary | ICD-10-CM | POA: Insufficient documentation

## 2020-01-06 DIAGNOSIS — E0590 Thyrotoxicosis, unspecified without thyrotoxic crisis or storm: Secondary | ICD-10-CM | POA: Diagnosis not present

## 2020-01-06 LAB — LIPID PANEL
Cholesterol: 123 mg/dL (ref 0–200)
HDL: 36 mg/dL — ABNORMAL LOW (ref >40)
LDL Cholesterol: 59 mg/dL (ref 0–99)
Total CHOL/HDL Ratio: 3.4 RATIO
Triglycerides: 138 mg/dL (ref <150)
VLDL: 28 mg/dL (ref 0–40)

## 2020-01-06 LAB — BASIC METABOLIC PANEL
Anion gap: 9 (ref 5–15)
BUN: 18 mg/dL (ref 8–23)
CO2: 25 mmol/L (ref 22–32)
Calcium: 9.3 mg/dL (ref 8.9–10.3)
Chloride: 105 mmol/L (ref 98–111)
Creatinine, Ser: 1.43 mg/dL — ABNORMAL HIGH (ref 0.61–1.24)
GFR calc Af Amer: 54 mL/min — ABNORMAL LOW (ref >60)
GFR calc non Af Amer: 46 mL/min — ABNORMAL LOW (ref >60)
Glucose, Bld: 134 mg/dL — ABNORMAL HIGH (ref 70–99)
Potassium: 4.8 mmol/L (ref 3.5–5.1)
Sodium: 139 mmol/L (ref 135–145)

## 2020-01-06 LAB — BRAIN NATRIURETIC PEPTIDE: B Natriuretic Peptide: 523.6 pg/mL — ABNORMAL HIGH (ref 0.0–100.0)

## 2020-01-06 NOTE — Addendum Note (Signed)
Encounter addended by: Valeda Malm, RN on: 01/06/2020 1:14 PM  Actions taken: Clinical Note Signed

## 2020-01-06 NOTE — Progress Notes (Signed)
ReDS Vest / Clip - 01/06/20 1300      ReDS Vest / Clip   Station Marker  C    Ruler Value  29    ReDS Value Range  Moderate volume overload    ReDS Actual Value  38    Anatomical Comments  sitting

## 2020-01-06 NOTE — Patient Instructions (Addendum)
Take lasix 40mg  (2 tabs) twice a day for 2 days only then go back to your normal dose.   Labs today We will only contact you if something comes back abnormal or we need to make some changes. Otherwise no news is good news!  Your physician has requested that you have an echocardiogram. Echocardiography is a painless test that uses sound waves to create images of your heart. It provides your doctor with information about the size and shape of your heart and how well your heart's chambers and valves are working. This procedure takes approximately one hour. There are no restrictions for this procedure.  Your physician recommends that you schedule a follow-up appointment in: 1 week for echo and visit with nurses for an EKG  April Garage code 5009  Your physician recommends that you schedule a follow-up appointment in: 4 months with Dr Aundra Dubin.    August garage code 4008   Please call office at [phone] option [phone] if you have any questions or concerns.   At the [facility], you and your health needs are our priority. As part of our continuing mission to provide you with exceptional heart care, we have created designated Provider Care Teams. These Care Teams include your primary Cardiologist (physician) and Advanced Practice Providers (APPs- Physician Assistants and Nurse Practitioners) who all work together to provide you with the care you need, when you need it.   You may see any of the following providers on your designated Care Team at your next follow up: [name] Dr Glori Bickers . Dr [name] . Darrick Grinder, NP . Lyda Jester, PA . Audry Riles, PharmD   Please be sure to bring in all your medications bottles to every appointment.

## 2020-01-07 ENCOUNTER — Encounter: Payer: Self-pay | Admitting: Gastroenterology

## 2020-01-07 ENCOUNTER — Ambulatory Visit (INDEPENDENT_AMBULATORY_CARE_PROVIDER_SITE_OTHER): Payer: PPO | Admitting: Gastroenterology

## 2020-01-07 VITALS — BP 104/52 | HR 72 | Temp 98.5°F | Ht 73.0 in | Wt 199.0 lb

## 2020-01-07 DIAGNOSIS — Z86010 Personal history of colonic polyps: Secondary | ICD-10-CM | POA: Diagnosis not present

## 2020-01-07 DIAGNOSIS — R1013 Epigastric pain: Secondary | ICD-10-CM

## 2020-01-07 DIAGNOSIS — R198 Other specified symptoms and signs involving the digestive system and abdomen: Secondary | ICD-10-CM | POA: Diagnosis not present

## 2020-01-07 DIAGNOSIS — K8020 Calculus of gallbladder without cholecystitis without obstruction: Secondary | ICD-10-CM | POA: Diagnosis not present

## 2020-01-07 DIAGNOSIS — Z85038 Personal history of other malignant neoplasm of large intestine: Secondary | ICD-10-CM | POA: Diagnosis not present

## 2020-01-07 MED ORDER — SUPREP BOWEL PREP KIT 17.5-3.13-1.6 GM/177ML PO SOLN: 1.0000 | ORAL | 0 refills | Status: DC

## 2020-01-07 NOTE — Progress Notes (Signed)
Patient ID: Troy Humphres [name], male   DOB: September 17, 1941, 79 y.o.   MRN: [id number] PCP: Dr. Quay Burow Cardiology: [name]  79 y.o. with history of HTN, COPD, active smoking/COPD, carotid stenosis s/p right CEA, paroxysmal atrial fibrillation, and PAD returns for followup of CHF and atrial fibrillation.  He does not have known obstructive CAD but is at high risk for CAD based on his comorbidities.  Lexiscan Cardiolite in 3/14 showed no ischemia or infarction and echo in 3/14 showed normal EF.  He had a left fem[facility]pop bypass at the [facility] in the '90s.  He is followed at [facility] for PAD. He has chronic right calf/thigh/buttocks claudication that is unchanged over the last few years and follows regularly at [facility].  He has had right lower lobectomy for lung cancer.  He continues to stay off cigarettes.    At a prior appointment, he was in atrial fibrillation but did not realize it.  I started him on Eliquis and diltiazem CD, and he spontaneously converted to NSR.  In 5/15, he was at [facility] and felt "strange" one day: fatigued, weak, short of breath.  He went to the ER and was in atrial fibrillation with HR in 80s-90s.  He spontaneously converted to NSR in the ER.  He felt back to normal after converting to NSR.  I started him on Multaq 400 mg bid.  With CHF, he was eventually transitioned over to amiodarone.   He had patch angioplasty revision of left fem-pop bypass in 11/16.  Now with minimal claudication.  He follows with [facility].     He had a Cardiolite and echo in 7/17 that were unremarkable.  Repeat Cardiolite in 12/17 showed no ischemia, echo was uninterpretable.  Cardiac MRI was therefore done in 1/18, showing EF 66% with normal-appearing RV, no late gadolinium enhancement.   Given increased exertional dyspnea and a defect on Cardiolite, he had LHC in 11/18.  This showed no obstructive CAD. He had a chest CT done given concern for possible amiodarone lung toxicity with increased dyspnea. This showed  emphysema, no ILD.    In 5/19, he developed a probable viral syndrome with dehydration and had a presyncopal episode.  He was orthostatic in the hospital.  He was noted to be in atrial fibrillation this admission which remained persistent.  He had a TEE-guided DCCV back to NSR.   In 9/19, he was admitted with GI bleeding and found to have a sigmoid mass on colonoscopy, path showed sigmoid adenocarcinoma.  He was noted to have right paratracheal lymphadenopathy but EUS with biopsy in 10/19 did not show malignancy.  He had surgical resection of colon cancer.   Amiodarone was stopped with elevated ESR and increased dyspnea.  He was also found to have hyperthyroidism. He was treated with methimazole by endocrinology.    He had atrial fibrillation ablation in 7/20.   Patient is in atrial fibrillation today.  He thinks that it began this morning, he has felt weak today.  He was feeling fine yesterday.  He has primarily been in NSR since ablation.  He walks in his driveway and has a trainer who comes to his house.  Generally no dyspnea walking in house and in driveway.  His legs "give out" before he gets short of breath.  His right leg has a moderately decreased ABI, but he does not have pain in the leg.  No pedal ulcers.   REDS clip 38%  ECG (personally reviewed): Atrial fibrillation rate 72  Labs (3/13): LDL 75, HDL 35, K 4.1, creatinine 1.1 Labs (3/14): LDL 91, HDL 27 Labs (10/14): K 4.3, creatinine 0.98 Labs (4/15): K 4.9, creatinine 1.1, LDL 76, HDL 30 Labs (5/15): K 4.3, creatinine 1.7, BNP 251 Labs (6/15): K 4.4, creatinine 1.3 Labs (10/15): TSH normal Labs (5/16): K 4.2, creatinine 1.12, HCT 39.5, LFTs normal, LDL 84, HDL 43, TSH normal Labs (11/16): creatinine 0.94 Labs (4/17): LDL 39, HDL 39 Labs (6/17): K 4.5, creatinine 1.2 Labs (9/17): K 4, creatinine 1.25, HCT 38.7 Labs (12/17): K 4.5, creatinine 1.49 => 1.62, BNP 43 Labs (2/18): K 3.8, creatinine 1.33, LFTs normal, TSH  normal Labs (6/18): K 4.3, creatinine 1.49, LFTs normal, TSH normal, hgb 12.3 Labs (11/18): ESR 60, TSH normal, LFTs normal, creatinine 1.34 Labs (1/19): LDL 50 Labs (5/19): K 4.6, creatinine 1.55, LFTs normal, hgb 12.7, LFTs normal Labs (10/19): K 4 => 4.1, creatinine 1.39 => 1.43, BNP 279, hgb 10.7, TSH low, free T4 and free T3 low, ESR 108 Labs (3/21): K 4.5, creatinine 1.19, TSH normal  PMH: 1. HTN: Fatigue and cough with ramipril use.  2. COPD: Quit smoking 2014. PFTs (1/18) with moderately severe COPD.  - CT chest (11/18): Emphysema noted, no ILD.  3. AAA: CT 1/13 with 3.0 cm AAA.  Abdominal US (1/14) with 3.4 cm AAA. Abdominal US (4/15) with 3.25 x 3.27 AAA. Abdominal US (3/16) with 3.7 cm AAA.  Followed at [facility].  - Abd [name] (1/19): 3.3 cm AAA. 4. Squamous cell lung cancer diagnosed 2/12.  Had right lower lobectomy in 4/12.  5. Hyperlipidemia 6. PAD: Left fem-pop bypass 1994.  ABIs (2/12) 0.62 on right, 0.95 on left. ABIs (1/14): 0.65 on right, 1.04 on left. ABIs (4/15) 0.66 right, 0.87 left.  ABIs (3/16) 0.6 right, 0.88 left.  Patch angioplasty left fem-pop bypass in 11/16.   - Left fem-pop bypass patent on doppler evaluation in 12/17.  - ABIs (2/21): normal on left (1.02), moderately decreased on right (0.54).  7. Stress myoview 2004 was normal. Lexiscan Cardiolite (3/14) with EF 66%, no ischemia or infarction. Lexiscan Cardiolite (7/17) with EF 61%, no ischemia/infarction.  - Cardiolite (12/17) with EF 62%, inferior/inferolateral fixed defect, most likely diaphragmatic attenuation, no ischemia.  - Cardiolite (9/18) with EF > 65%, fixed inferior defect (attenuation versus infarction), no ischemia.  - LHC (11/18): No obstructive CAD.  8. Chronic diastolic CHF: Echo (0/09): technically difficult with EF 55-60%, upper normal RV size.  Echo (5/16) with EF 60-65%.  Echo (7/17) with EF 55-60%, normal RV size and systolic function.  - Echo 12/17 with very poor windows, unable to comment on LV  or RV function.  - Cardiac MRI (1/18) with EF 66%, normal RV size and systolic function.  - TEE (5/19): EF 55-60%, normal RV size and systolic function.  9. Carotid stenosis: TIA 10/14.  Carotid dopplers with > 23% RICA, 30-07% LICA.  Patient had right CEA in 10/14. Carotids (4/15) patent right CEA, LICA 62-26% stenosis.  Carotids (33/35) < 45% LICA, patent right CEA.  - Carotid dopplers (12/17): right CEA ok, < 62% LICA stenosis.  - Carotid dopplers (1/19): Right CEA ok, 5-63% LICA stenosis.  - Carotid dopplers (2/21): Right CEA ok, LICA 89-37% stenosis.  10. Atrial fibrillation: Paroxysmal. First noted after lobectomy in 4/12 (brief), recurrence in 4/15 then in 5/15.  - TEE-guided DCCV for persistent atrial fibrillation in 5/19.  - Atrial fibrillation ablation 7/20.  11. Spinal stenosis 12. OSA: Using CPAP.  13.  Glaucoma 14. Has ILR 15. Colon cancer: Sigmoid adenocarcinoma, s/p surgical resection.  16. 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   SH: Married, lives in [place].  Former [place].  Quit smoking in 2014.  Rare ETOH now.   FH: No premature CAD  ROS: All systems reviewed and negative except as per HPI.   Current Outpatient Medications  Medication Sig Dispense Refill  . acetaminophen (TYLENOL) 325 MG tablet Take 325 mg by mouth every 6 (six) hours as needed for moderate pain or headache.     [name] amLODipine (NORVASC) 5 MG tablet TAKE 1 TABLET ONCE DAILY. 30 tablet 0  . Ascorbic Acid (VITAMIN C PO) Take 1 tablet by mouth daily.    . AZOPT 1 % ophthalmic suspension Place 1 drop into both eyes in the morning and at bedtime.     . bimatoprost (LUMIGAN) 0.01 % SOLN Place 1 drop at bedtime into both eyes.     [name] docusate sodium (COLACE) 100 MG capsule Take 100 mg by mouth 2 (two) times daily.     [name] ELIQUIS 5 MG TABS tablet TAKE 1 TABLET BY MOUTH TWICE DAILY. 60 tablet 0  . ferrous sulfate 325 (65 FE) MG tablet Take 325 mg by mouth at bedtime.     [name]  FLUoxetine (PROZAC) 40 MG capsule Take 1 capsule (40 mg total) by mouth daily. 90 capsule 3  . furosemide (LASIX) 20 MG tablet TAKE 2 TABLETS EACH MORNING. 60 tablet 0  . losartan (COZAAR) 25 MG tablet Take 2 tablets (50 mg total) by mouth daily. 60 tablet 5  . Magnesium 500 MG CAPS Take 500 mg by mouth daily.     . Melatonin 5 MG TABS Take 5 mg by mouth at bedtime.    . metoprolol succinate (TOPROL-XL) 25 MG 24 hr tablet Take 0.5 tablets (12.5 mg total) by mouth daily. Please call for office visit [phone] (Patient taking differently: Take 12.5 mg by mouth at bedtime. ) 14 tablet 0  . montelukast (SINGULAIR) 10 MG tablet TAKE ONE TABLET AT BEDTIME. (Patient taking differently: Take 10 mg by mouth at bedtime. ) 30 tablet 5  . Multiple Vitamins-Minerals (CENTRUM SILVER PO) Take 1 tablet by mouth daily.    . Multiple Vitamins-Minerals (ZINC PO) Take 1 tablet by mouth daily.    [name] omeprazole (PRILOSEC) 20 MG capsule Take 1 capsule (20 mg total) by mouth 2 (two) times daily before a meal. Take 30- 60 min before your first and last meals of the day 180 capsule 1  . Polyethyl Glycol-Propyl Glycol (SYSTANE) 0.4-0.3 % SOLN Place 1 drop into both eyes daily as needed (for dry eyes).     . potassium chloride SA (KLOR-CON) 20 MEQ tablet TAKE 1 TABLET ONCE DAILY. 30 tablet 0  . rosuvastatin (CRESTOR) 40 MG tablet TAKE 1 TABLET BY MOUTH DAILY. (Patient taking differently: Take 40 mg by mouth daily. ) 90 tablet 0  . senna (SENOKOT) 8.6 MG tablet Take 1 tablet by mouth 2 (two) times daily.    . SYMBICORT 160-4.5 MCG/ACT inhaler USE 2 PUFFS TWICE DAILY. (Patient taking differently: Inhale 2 puffs into the lungs in the morning and at bedtime. ) 10.2 g 5  . Na Sulfate-K Sulfate-Mg Sulf (SUPREP BOWEL PREP KIT) 17.5-3.13-1.6 GM/177ML SOLN Take 1 kit by mouth as directed. For colonoscopy prep 354 mL 0   No current facility-administered medications for this encounter.    BP (!) 118/54   Pulse 68   Wt  90.3 kg (199  lb)   SpO2 95%   BMI 26.25 kg/m  General: NAD Neck: JVP 8 cm, no thyromegaly or thyroid nodule.  Lungs: Decreased breath sounds on right. CV: Nondisplaced PMI.  Heart irregular S1/S2, no S3/S4, no murmur.  No peripheral edema.  No carotid bruit.  Unable to palpate right pedal pulses.  Abdomen: Soft, nontender, no hepatosplenomegaly, no distention.  Skin: Intact without lesions or rashes.  Neurologic: Alert and oriented x 3.  Psych: Normal affect. Extremities: No clubbing or cyanosis.  HEENT: Normal.   Assessment/Plan: 1. Chronic diastolic CHF: TEE in 3/53 showed LV EF 55-60% with normal RV size and systolic function.  Feeling weaker today but noted to be back in atrial fibrillation. Mildly volume overloaded with mildly increased REDS clip number.    - Increase Lasix to 40 mg bid x 2 days then back to 40 mg daily.  BMET/BNP today.   - I will arrange for echo.  2. Hyperlipidemia: Continue Crestor.  Check lipids today.    3. Carotid stenosis: s/p R CEA.  Dopplers followed at [facility].  4. PAD: s/p patch angioplasty to left fem-pop bypass in 11/16.  Followed at [facility]. He is off cilostazol with CHF but his claudication has actually improved over time.  Moderately decreased ABI on right in 2/21, but no pain or pedal ulcerations.   5. AAA: Stable on last [name], followed at [facility].    6. COPD: No longer smoking. PFTs in 1/18 showed moderately severe obstructive defect. CT chest in 11/18 showed emphysema.   7. Atrial fibrillation: S/p atrial fibrillation ablation in 7/20.  He seems to be primarily in NSR generally, but is in atrial fibrillation today.   - Repeat echo in 1 week to see if still in atrial fibrillation (if so, would arrange DCCV).   - Continue Toprol XL 12.5 mg daily.  - Continue Eliquis 5 mg bid.  - Continue to limit ETOH.  - Continue to use CPAP.  8. CKD: Stage 3. BMET today.   9. CAD: LHC in 11/18 with nonobstructive disease only.   10. Hyperthyroidism: Suspect amiodarone-related  hyperthyroidism.  He is no longer on methimazole, follows with endocrinology.     11. Colon cancer: Sigmoid adenocarcinoma, s/p surgical resection.  12. HTN: Now controlled on amlodipine and losartan.  13. Pleural effusion: Chronic right pleural effusion, s/p right lower lobectomy.  To get CT to followup on this.     [name] 01/07/2020

## 2020-01-07 NOTE — Progress Notes (Signed)
[facility] VISIT   Primary Care Provider Binnie Rail, MD [place] [address] [phone]  Patient Profile: Troy [name]. is a 79 y.o. male with a pmh significant for Colon cancer status post LAR, colon polyps, diverticulosis, cholelithiasis, gastritis with goblet cell metaplasia, chronic renal insufficiency, carotid artery stenosis, history of prior BCC, A. fib (on anticoagulation), BPH, prior lung cancer, sleep apnea, peripheral arterial disease, hypertension, hyperlipidemia.  The patient presents to the [facility] for an evaluation and management of problem(s) noted below:  Problem List 1. History of colon cancer   2. History of colonic polyps   3. Epigastric pain   4. Calculus of gallbladder without cholecystitis without obstruction   5. Abnormal findings on esophagogastroduodenoscopy (EGD)     History of Present Illness This is a patient that I initially met back in 2019 in the setting of iron deficiency anemia and underwent endoscopic evaluation finding of colon cancer.  He underwent an LAR in 2019.  He was supposed to come back for his 1 year colonoscopy for colon cancer follow-up but did not follow-up.  The patient on discussion today was not even sure why he was here in clinic.  I did review however with him and he would now recall that earlier in March she had a single day of abdominal discomfort in the midepigastrium.  Pain progressed and worsened to the point he had to come to the ED.  He had a right upper quadrant ultrasound performed and they did not see evidence of a stone.  However, the patient had a previous CT abdomen/pelvis with evidence of a calcified gallstone in the gallbladder.  No evidence of biliary obstruction.  His liver tests were normal.  He was pain-free by the end of his ED evaluation.  He has monitored what he is eating and trying not to eat fatty foods.  He is doing better and he has  no recurrence of pain.  Patient is on anticoagulation for his history of atrial fibrillation.  Patient has no other significant issues and continues to do well.  He is hopeful and ready to get back on schedule since he feels more comfortable about the current COVID-19 pandemic.  GI Review of Systems Positive as above Negative for dysphagia, odynophagia, nausea, vomiting, change in bowel habits, melena, hematochezia  Review of Systems General: Denies fevers/chills/weight loss HEENT: Denies oral lesions Cardiovascular: Denies chest pain Pulmonary: Denies shortness of breath Gastroenterological: See HPI Genitourinary: Denies darkened urine Hematological: Denies easy bruising/bleeding Dermatological: Denies jaundice Psychological: Mood is stable   Medications Current Outpatient Medications  Medication Sig Dispense Refill  . acetaminophen (TYLENOL) 325 MG tablet Take 325 mg by mouth every 6 (six) hours as needed for moderate pain or headache.     [name] amLODipine (NORVASC) 5 MG tablet TAKE 1 TABLET ONCE DAILY. 30 tablet 0  . Ascorbic Acid (VITAMIN C PO) Take 1 tablet by mouth daily.    . AZOPT 1 % ophthalmic suspension Place 1 drop into both eyes in the morning and at bedtime.     . bimatoprost (LUMIGAN) 0.01 % SOLN Place 1 drop at bedtime into both eyes.     [name] docusate sodium (COLACE) 100 MG capsule Take 100 mg by mouth 2 (two) times daily.     [name] ELIQUIS 5 MG TABS tablet TAKE 1 TABLET BY MOUTH TWICE DAILY. 60 tablet 0  . ferrous sulfate 325 (65 FE) MG tablet Take 325 mg by  mouth at bedtime.     [name] FLUoxetine (PROZAC) 40 MG capsule Take 1 capsule (40 mg total) by mouth daily. 90 capsule 3  . furosemide (LASIX) 20 MG tablet TAKE 2 TABLETS EACH MORNING. 60 tablet 0  . losartan (COZAAR) 25 MG tablet Take 2 tablets (50 mg total) by mouth daily. 60 tablet 5  . Magnesium 500 MG CAPS Take 500 mg by mouth daily.     . Melatonin 5 MG TABS Take 5 mg by mouth at bedtime.    . metoprolol succinate  (TOPROL-XL) 25 MG 24 hr tablet Take 0.5 tablets (12.5 mg total) by mouth daily. Please call for office visit [phone] (Patient taking differently: Take 12.5 mg by mouth at bedtime. ) 14 tablet 0  . montelukast (SINGULAIR) 10 MG tablet TAKE ONE TABLET AT BEDTIME. (Patient taking differently: Take 10 mg by mouth at bedtime. ) 30 tablet 5  . Multiple Vitamins-Minerals (CENTRUM SILVER PO) Take 1 tablet by mouth daily.    . Multiple Vitamins-Minerals (ZINC PO) Take 1 tablet by mouth daily.    [name] omeprazole (PRILOSEC) 20 MG capsule Take 1 capsule (20 mg total) by mouth 2 (two) times daily before a meal. Take 30- 60 min before your first and last meals of the day 180 capsule 1  . Polyethyl Glycol-Propyl Glycol (SYSTANE) 0.4-0.3 % SOLN Place 1 drop into both eyes daily as needed (for dry eyes).     . potassium chloride SA (KLOR-CON) 20 MEQ tablet TAKE 1 TABLET ONCE DAILY. 30 tablet 0  . rosuvastatin (CRESTOR) 40 MG tablet TAKE 1 TABLET BY MOUTH DAILY. (Patient taking differently: Take 40 mg by mouth daily. ) 90 tablet 0  . senna (SENOKOT) 8.6 MG tablet Take 1 tablet by mouth 2 (two) times daily.    . SYMBICORT 160-4.5 MCG/ACT inhaler USE 2 PUFFS TWICE DAILY. (Patient taking differently: Inhale 2 puffs into the lungs in the morning and at bedtime. ) 10.2 g 5  . Na Sulfate-K Sulfate-Mg Sulf (SUPREP BOWEL PREP KIT) 17.5-3.13-1.6 GM/177ML SOLN Take 1 kit by mouth as directed. For colonoscopy prep 354 mL 0   No current facility-administered medications for this visit.    Allergies Allergies  Allergen Reactions  . Niacin-Lovastatin Er Shortness Of Breath and Other (See Comments)    Dyspnea, flushing  . Penicillins Hives, Shortness Of Breath and Other (See Comments)    Flushing  & dyspnea Because of a history of documented adverse serious drug reaction;Medi Alert bracelet  is recommended PCN reaction causing immediate rash, facial/tongue/throat swelling, SOB or lightheadedness with hypotension: Yes PCN  reaction causing severe rash involving mucus membranes or skin necrosis: No PCN reaction occurring within the last 10 years: NO PCN reaction that required hospitalization: NO  . Sulfonamide Derivatives Hives, Shortness Of Breath and Other (See Comments)    Flushing & dyspnea Because of a history of documented adverse serious drug reaction;Medi Alert bracelet  is recommended  . Atorvastatin Other (See Comments)    Myalgias & athralgias- takes Crestor, DOES NOT LIKE LIPITOR    Histories Past Medical History:  Diagnosis Date  . AAA (abdominal aortic aneurysm) ([place]) LAST ABDOMINAL US 10-20-17 3.3 CM   MONITORED BY DR [name]  . Anxiety   . Arthritis   . Atrial fibrillation ([place])   . Basal cell carcinoma   . Bilateral carotid artery stenosis DUPLEX 12-29-2012  BY DR [facility]   BILATERAL ICA STENOSIS 60-79%  . BPH (benign prostatic hypertrophy)   . Chronic diastolic heart  failure ([place])   . Chronic kidney disease    stage 3, pt unaware  . Colon cancer ([place])   . Complication of anesthesia    had nausea after carotid artery surgery, states the medicine given to help the nausea, made it worse  . COPD (chronic obstructive pulmonary disease) ([place])   . Depression   . GERD (gastroesophageal reflux disease)   . Glaucoma BOTH EYES   Dr Gershon Crane  . History of basal cell carcinoma excision   . History of lung cancer APRIL 2012  SQUAMOUS CELL---- S/P RIGHT LOWER LOBECTOMY AT [facility] --  NO CHEMORADIATION---  NO RECURRENCE    ONCOLOGIST- DR Tressie Stalker  LOV IN [facility] 10-27-2012  . History of pneumothorax    pt unaware  . Hx of adenomatous colonic polyps 2005    X 2; 1 hyperplastic polyp; Dr Olevia Perches  . Hyperlipidemia   . Hypertension   . Impaired fasting glucose 2007   108; A1c5.4%  . Lesion of bladder   . Lung cancer ([place]) 01/25/2012  . Microhematuria   . OSA (obstructive sleep apnea) 08/29/2015   CPAP SET ON 10  . PAD (peripheral artery disease) ([place]) ABI'S  JAN 2014  0.65 ON RIGHT ;  1.04 ON LEFT   . Peripheral vascular disease ([place]) S/P ANGIOPLASTY AND STENTING   FOLLOWED  BY DR [name]  . Pleural effusion on right 11/18/2018   Chronic, noted on CXR  . Spinal stenosis Sept. 2015  . Status post placement of implantable loop recorder   . Stroke [facility]) Jul 07, 2013   mini  TIA  . Thoracic aorta atherosclerosis ([place])   . Thyrotoxicosis    amiodarone induced  . Urgency of urination    Past Surgical History:  Procedure Laterality Date  . ANGIO PLASTY     X 4 in legs  . AORTOGRAM  07-27-2002   MILD DIFFUSE ILIAC ARTERY OCCLUSIVE DISEASE /  LEFT RENAL ARTERY 20%/ PATENT LEFT FEM-POP GRAFT/ MILD SFA AND POPLITEAL ARTERY OCCLUSIVE DISEASE W/ SEVERE KIDNEY OCCLUSIVE DISEASE  . ATRIAL FIBRILLATION ABLATION N/A 04/09/2019   Procedure: ATRIAL FIBRILLATION ABLATION;  Surgeon: Thompson Grayer, MD;  Location: [place] CV [facility];  Service: Cardiovascular;  Laterality: N/A;  . BASAL CELL CARCINOMA EXCISION     MULTIPLE TIMES--  RIGHT FOREARM, CHEEKS, AND BACK  . BIOPSY  06/25/2018   Procedure: BIOPSY;  Surgeon: Rush Landmark Telford Nab., MD;  Location: [place];  Service: Gastroenterology;;  . CARDIOVASCULAR STRESS TEST  12-08-2012  DR MCLEAN   NORMAL LEXISCAN WITH NO EXERCISE NUCLEAR STUDY/ EF 66%/   NO ISCHEMIA/ NO SIGNIFICANT CHANGE FROM PRIOR STUDY  . CARDIOVERSION N/A 02/14/2018   Procedure: CARDIOVERSION;  Surgeon: Larey Dresser, MD;  Location: [facility] ENDOSCOPY;  Service: Cardiovascular;  Laterality: N/A;  . CAROTID ANGIOGRAM N/A 07/10/2013   Procedure: CAROTID ANGIOGRAM;  Surgeon: Elam Dutch, MD;  Location: [facility] CATH LAB;  Service: Cardiovascular;  Laterality: N/A;  . CAROTID ENDARTERECTOMY Right 07-14-13   cea  . CATARACT EXTRACTION W/ INTRAOCULAR LENS  IMPLANT, BILATERAL    . colon polyectomy    . COLONOSCOPY WITH PROPOFOL N/A 06/25/2018   Procedure: COLONOSCOPY WITH PROPOFOL;  Surgeon: Rush Landmark Telford Nab., MD;  Location: [place];  Service: Gastroenterology;  Laterality: N/A;   . CYSTOSCOPY W/ RETROGRADES Bilateral 01/21/2013   Procedure: CYSTOSCOPY WITH RETROGRADE PYELOGRAM;  Surgeon: Molli Hazard, MD;  Location: [facility];  Service: Urology;  Laterality: Bilateral;   CYSTO, BLADDER BIOPSY, BILATERAL RETROGRADE  PYELOGRAM  RAD TECH FROM RADIOLOGY PER JOY  . CYSTOSCOPY WITH BIOPSY N/A 01/21/2013   Procedure: CYSTOSCOPY WITH BIOPSY;  Surgeon: Molli Hazard, MD;  Location: [facility];  Service: Urology;  Laterality: N/A;  . ENDARTERECTOMY Right 07/14/2013   Procedure: ENDARTERECTOMY CAROTID;  Surgeon: Angelia Mould, MD;  Location: [place];  Service: Vascular;  Laterality: Right;  . EP IMPLANTABLE DEVICE N/A 08/12/2015   Procedure: Loop Recorder Insertion;  Surgeon: Thompson Grayer, MD;  Location: [place] [facility];  Service: Cardiovascular;  Laterality: N/A;  . ESOPHAGOGASTRODUODENOSCOPY (EGD) WITH PROPOFOL N/A 06/25/2018   Procedure: ESOPHAGOGASTRODUODENOSCOPY (EGD) WITH PROPOFOL;  Surgeon: Rush Landmark Telford Nab., MD;  Location: [place];  Service: Gastroenterology;  Laterality: N/A;  . ESOPHAGOGASTRODUODENOSCOPY (EGD) WITH PROPOFOL N/A 07/10/2018   Procedure: ESOPHAGOGASTRODUODENOSCOPY (EGD) WITH PROPOFOL;  Surgeon: Milus Banister, MD;  Location: [facility] ENDOSCOPY;  Service: Endoscopy;  Laterality: N/A;  . EUS N/A 07/10/2018   Procedure: UPPER ENDOSCOPIC ULTRASOUND (EUS) RADIAL;  Surgeon: Milus Banister, MD;  Location: [facility] ENDOSCOPY;  Service: Endoscopy;  Laterality: N/A;  . EYE SURGERY Right   . FEMORAL-POPLITEAL BYPASS GRAFT Left [facility]   AND 2001  IN [place]  . FEMORAL-POPLITEAL BYPASS GRAFT Left 08/30/2015   Procedure: REVISION OF BYPASS GRAFT Left  FEMORAL-POPLITEAL ARTERY;  Surgeon: Angelia Mould, MD;  Location: [place];  Service: Vascular;  Laterality: Left;  . FINE NEEDLE ASPIRATION N/A 07/10/2018   Procedure: FINE NEEDLE ASPIRATION (FNA) LINEAR;  Surgeon: Milus Banister, MD;  Location:  [facility] ENDOSCOPY;  Service: Endoscopy;  Laterality: N/A;  . FLEXIBLE SIGMOIDOSCOPY N/A 12/12/2018   Procedure: FLEXIBLE SIGMOIDOSCOPY;  Surgeon: Ileana Roup, MD;  Location: [facility] ORS;  Service: General;  Laterality: N/A;  . LARYNGOSCOPY  06-27-2004   BX VOCAL CORD  (LEUKOPLAKIA)  PER PT NO ISSUES SINCE  . LEFT HEART CATH AND CORONARY ANGIOGRAPHY N/A 08/20/2017   Procedure: LEFT HEART CATH AND CORONARY ANGIOGRAPHY;  Surgeon: Larey Dresser, MD;  Location: [place] CV LAB;  Service: Cardiovascular;  Laterality: N/A;  . LOOP RECORDER INSERTION N/A 04/09/2019   Procedure: LOOP RECORDER INSERTION;  Surgeon: Thompson Grayer, MD;  Location: [place] [facility] LAB;  Service: Cardiovascular;  Laterality: N/A;  . LOOP RECORDER REMOVAL N/A 04/09/2019   Procedure: LOOP RECORDER REMOVAL;  Surgeon: Thompson Grayer, MD;  Location: [place] [facility];  Service: Cardiovascular;  Laterality: N/A;  . LOWER EXTREMITY ANGIOGRAM Bilateral 08/29/2015   Procedure: Lower Extremity Angiogram;  Surgeon: Angelia Mould, MD;  Location: [place] [facility] LAB;  Service: Cardiovascular;  Laterality: Bilateral;  . LUNG LOBECTOMY  01/24/2011    RIGHT UPPER LOBE  (SQUAMOUS CELL CARCINOMA) Dr Dorthea Cove , [facility]. No chemotherapyor radiation  . PATCH ANGIOPLASTY Right 07/14/2013   Procedure: PATCH ANGIOPLASTY;  Surgeon: Angelia Mould, MD;  Location: [place];  Service: Vascular;  Laterality: Right;  . PATCH ANGIOPLASTY Left 08/30/2015   Procedure: VEIN PATCH ANGIOPLASTY OF PROXIMAL Left BYPASS GRAFT;  Surgeon: Angelia Mould, MD;  Location: [place];  Service: Vascular;  Laterality: Left;  . PERIPHERAL VASCULAR CATHETERIZATION N/A 08/29/2015   Procedure: Abdominal Aortogram;  Surgeon: Angelia Mould, MD;  Location: [name] [facility];  Service: Cardiovascular;  Laterality: N/A;  . POLYPECTOMY  06/25/2018   Procedure: POLYPECTOMY;  Surgeon: Rush Landmark Telford Nab., MD;  Location: [place];  Service: Gastroenterology;;   . Lia Foyer INJECTION  06/25/2018   Procedure: SUBMUCOSAL INJECTION;  Surgeon: Irving Copas.,  MD;  Location: [place];  Service: Gastroenterology;;  . TEE WITHOUT CARDIOVERSION N/A 02/14/2018   Procedure: TRANSESOPHAGEAL ECHOCARDIOGRAM (TEE);  Surgeon: Larey Dresser, MD;  Location: [facility] ENDOSCOPY;  Service: Cardiovascular;  Laterality: N/A;  . trabecular surgery     OS  . TRANSTHORACIC ECHOCARDIOGRAM  12-29-2012  DR [facility]   MILD LVH/  LVSF NORMAL/ EF 81-44%/  GRADE I DIASTOLIC DYSFUNCTION   Social History   Socioeconomic History  . Marital status: Married    Spouse name: [name]   . Number of children: 3  . Years of education: 12+  . Highest education level: Not on file  Occupational History  . 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 Smoking status: Former Smoker    Packs/day: 0.50    Years: 50.00    Pack years: 25.00    Types: Cigarettes    Quit date: 07/28/2014    Years since quitting: 5.4  . Smokeless tobacco: Never Used  Substance and Sexual Activity  . Alcohol use: Yes    Alcohol/week: 0.0 standard drinks    Comment:  socially, variable  . Drug use: No  . Sexual activity: Not on file  Other Topics Concern  . Not on file  Social History Narrative   Patient lives at home with spouse [name]   Patient has 3 children    Patient is right handed    Social Determinants of Health   Financial Resource Strain:   . Difficulty of Paying Living Expenses:   Food Insecurity:   . Worried About Charity fundraiser in the Last Year:   . Arboriculturist in the Last Year:   Transportation Needs:   . Film/video editor (Medical):   [name] Lack of Transportation (Non-Medical):   Physical Activity:   . Days of Exercise per Week:   . Minutes of Exercise per Session:   Stress:   . Feeling of Stress :   Social Connections:   . Frequency of  Communication with Friends and Family:   . Frequency of Social Gatherings with Friends and Family:   . Attends Religious Services:   . Active Member of Clubs or Organizations:   . Attends Archivist Meetings:   [name] Marital Status:   Intimate Partner Violence:   . Fear of Current or Ex-Partner:   . Emotionally Abused:   [name] Physically Abused:   . Sexually Abused:    Family History  Problem Relation Age of Onset  . Stroke Mother        mini strokes  . Alcohol abuse Father   . Heart disease Father        MI after 5  . Stroke Father   . Hypertension Father   . Heart attack Father   . Heart disease Paternal Aunt        several  . Hypertension Paternal Aunt        several  . Stroke Paternal Aunt        several  . Stroke Paternal Uncle        several  . Heart disease Paternal Uncle        several;2 had MI pre 93  . Cancer Daughter 1       breast ca, also with benign sessile polyp    I have reviewed his medical, social, and family history  in detail and updated the electronic medical record as necessary.    PHYSICAL EXAMINATION  BP (!) 104/52   Pulse 72   Temp 98.5 F (36.9 C)   Ht '6\' 1"'$  (1.854 m)   Wt 199 lb (90.3 kg)   SpO2 96%   BMI 26.25 kg/m  Wt Readings from Last 3 Encounters:  01/07/20 199 lb (90.3 kg)  01/06/20 199 lb (90.3 kg)  11/30/19 199 lb (90.3 kg)  GEN: NAD, appears stated age, doesn't appear chronically ill PSYCH: Cooperative, without pressured speech EYE: Conjunctivae pink, sclerae anicteric ENT: MMM, without oral ulcers, no erythema or exudates noted CV: Nontachycardic RESP: No appreciable wheezing on exam GI: NABS, soft, surgical scars present, nontender, nondistended, no rebound or guarding  MSK/EXT: Trace bilateral lower extremity edema SKIN: No jaundice NEURO:  Alert & Oriented x 3, no focal deficits   REVIEW OF DATA  I reviewed the following data at the time of this encounter:  GI Procedures and Studies  September 2019 EGD -  No gross lesions in esophagus. - Salmon-colored mucosa suspicious for Barrett's esophagus. Biopsied to rule out Barrett's. - Z-line irregular, 46 cm from the incisors. - No gross lesions in the stomach. Biopsied for HP. - Erythematous duodenopathy in bulb. No other gross lesions in the second portion of the duodenum and in the third portion of the duodenum. Biopsied to rule out Celiac.  September 2019 colonoscopy - Preparation of the colon was fair. - The examined portion of the ileum was normal. - Three 5 to 8 mm polyps in the ascending colon, removed with a hot snare. Resected and retrieved. - Three 3 to 6 mm polyps in the descending colon, at the hepatic flexure and in the ascending colon, removed with a cold snare. Resected and retrieved. - Rule out malignancy, partially obstructing tumor in the sigmoid colon. Biopsied. Tattooed proximally. - Diverticulosis in the recto-sigmoid colon, in the sigmoid colon and in the descending colon. - Non-bleeding non-thrombosed internal hemorrhoids.  Pathology Diagnosis 1. Duodenum, Biopsy - BENIGN SMALL BOWEL MUCOSA. - NO ACTIVE INFLAMMATION OR VILLOUS ATROPHY IDENTIFIED. 2. Stomach, biopsy - CHRONIC GASTRITIS WITH GOBLET CELL METAPLASIA. - THERE IS NO EVIDENCE OF HELICOBACTER PYLORI, DYSPLASIA OR MALIGNANCY. - SEE COMMENT. 3. Esophagus, biopsy, at 45 cm - GASTROESOPHAGEAL JUNCTION MUCOSA WITH MILD INFLAMMATION CONSISTENT WITH GASTROESOPHAGEAL REFLUX. - THERE IS NO EVIDENCE OF GOBLET CELL METAPLASIA, DYSPLASIA OR MALIGNANCY IDENTIFIED. 4. Colon, polyp(s), ascending, hepatic flexure and descending - TUBULAR ADENOMA(S). - HIGH GRADE DYSPLASIA IS NOT IDENTIFIED. 5. Colon, biopsy, sigmoid at 35-39 cm mass - ADENOCARCINOMA. - SEE COMMENT.  Laboratory Studies  Reviewed those in epic  Imaging Studies  March 2021 right upper quadrant ultrasound IMPRESSION: No definite abnormality seen in the right upper quadrant of  the Abdomen.  September 2019 CT abdomen pelvis with contrast CT Abdomen and Pelvis: 1. Mass involving the mid sigmoid colon which corresponds to the patient's known colon cancer. 2. No evidence of metastatic disease in the abdomen or pelvis. 3. 3.8 cm saccular infrarenal abdominal aortic aneurysm. (ICD10-I71.9). Recommend followup by ultrasound in 2 years. This recommendation follows ACR consensus guidelines: White Paper of the ACR Incidental Findings Committee II on Vascular Findings. J Am Coll Radiol 2013; 10:789-794. 4. Cholelithiasis without evidence of acute cholecystitis. 5. Hepatic steatosis. 6. Diverticulum arising medially from the descending duodenum. 7. Distal descending and proximal sigmoid colon diverticulosis without evidence of acute diverticulitis. 8. Moderate median lobe prostate gland enlargement. 9. Scarring involving the LOWER pole the  LEFT kidney. 10. Small BILATERAL inguinal hernias containing fat.   ASSESSMENT  Mr. Hemenway is a 80 y.o. male with a pmh significant for Colon cancer status post LAR, colon polyps, diverticulosis, cholelithiasis, gastritis with goblet cell metaplasia, chronic renal insufficiency, carotid artery stenosis, history of prior BCC, A. fib (on anticoagulation), BPH, prior lung cancer, sleep apnea, peripheral arterial disease, hypertension, hyperlipidemia.  The patient is seen today for evaluation and management of:  1. History of colon cancer   2. History of colonic polyps   3. Epigastric pain   4. Calculus of gallbladder without cholecystitis without obstruction   5. Abnormal findings on esophagogastroduodenoscopy (EGD)    The patient is clinically and hemodynamically stable.  The patient is due for colon cancer screening/surveillance in the setting of his recent colon cancer of 2019 status post LAR.  We will get permission for his anticoagulation to be held for at least 1 if not 2 days from cardiology service.  The patient is no longer  having any recurrence of the abdominal pain that led him to the emergency department in March.  I do think based on his discomfort that it would be worthwhile for [name] to go ahead and do an upper endoscopy at the same time as colonoscopy for diagnostic purposes.  He also did have evidence of gastritis with goblet cell metaplasia on prior biopsies.  We will plan to reevaluate ensure there is nothing that is causing him current issues.  The risks and benefits of endoscopic evaluation were discussed with the patient; these include but are not limited to the risk of perforation, infection, bleeding, missed lesions, lack of diagnosis, severe illness requiring hospitalization, as well as anesthesia and sedation related illnesses.  The patient is agreeable to proceed.  All patient questions were answered, to the best of my ability, and the patient agrees to the aforementioned plan of action with follow-up as indicated.   PLAN  Proceed with scheduling colonoscopy for colon cancer surveillance Proceed with scheduling diagnostic endoscopy to evaluate previous symptoms of midepigastric abdominal pain and follow-up gastritis with goblet cell metaplasia Keep in mind patient does have cholelithiasis if he has recurrent issues or biliary type colic then we need to consider cholecystectomy at some point We will get patient off anticoagulation per cardiology approval for at least 1 if not 2 days prior to procedures   Orders Placed This Encounter  Procedures  . Ambulatory referral to Gastroenterology    New Prescriptions   NA SULFATE-K SULFATE-MG SULF (SUPREP BOWEL PREP KIT) 17.5-3.13-1.6 GM/177ML SOLN    Take 1 kit by mouth as directed. For colonoscopy prep   Modified Medications   No medications on file    Planned Follow Up No follow-ups on file.   Total Time in Face-to-Face and in Coordination of Care for patient including independent/personal interpretation/review of prior testing, medical history,  examination, medication adjustment, communicating results with the patient directly, and documentation with the EHR is 25 minutes.   Justice Britain, MD [facility] Office # [phone]

## 2020-01-07 NOTE — Addendum Note (Signed)
Encounter addended by: Larey Dresser, MD on: 01/07/2020 10:37 PM  Actions taken: Clinical Note Signed, Level of Service modified, Visit diagnoses modified

## 2020-01-07 NOTE — Patient Instructions (Signed)
You have been scheduled for an endoscopy and colonoscopy. Please follow the written instructions given to you at your visit today. Please pick up your prep supplies at the pharmacy within the next 1-3 days. If you use inhalers (even only as needed), please bring them with you on the day of your procedure.  If you are age 80 or older, your body mass index should be between 23-30. Your Body mass index is 26.25 kg/m. If this is out of the aforementioned range listed, please consider follow up with your Primary Care Provider.  If you are age 76 or younger, your body mass index should be between 19-25. Your Body mass index is 26.25 kg/m. If this is out of the aformentioned range listed, please consider follow up with your Primary Care Provider.    Due to recent changes in healthcare laws, you may see the results of your imaging and laboratory studies on MyChart before your provider has had a chance to review them.  We understand that in some cases there may be results that are confusing or concerning to you. Not all laboratory results come back in the same time frame and the provider may be waiting for multiple results in order to interpret others.  Please give [name] 48 hours in order for your provider to thoroughly review all the results before contacting the office for clarification of your results.   We have sent the following medications to your pharmacy for you to pick up at your convenience: Suprep   Thank you for choosing me and [facility].  Dr. Rush Landmark

## 2020-01-08 ENCOUNTER — Encounter: Payer: Self-pay | Admitting: Gastroenterology

## 2020-01-08 DIAGNOSIS — Z85038 Personal history of other malignant neoplasm of large intestine: Secondary | ICD-10-CM | POA: Insufficient documentation

## 2020-01-08 DIAGNOSIS — R1013 Epigastric pain: Secondary | ICD-10-CM | POA: Insufficient documentation

## 2020-01-08 DIAGNOSIS — R198 Other specified symptoms and signs involving the digestive system and abdomen: Secondary | ICD-10-CM | POA: Insufficient documentation

## 2020-01-08 DIAGNOSIS — K8020 Calculus of gallbladder without cholecystitis without obstruction: Secondary | ICD-10-CM | POA: Insufficient documentation

## 2020-01-12 DIAGNOSIS — C187 Malignant neoplasm of sigmoid colon: Secondary | ICD-10-CM | POA: Diagnosis not present

## 2020-01-13 ENCOUNTER — Ambulatory Visit (HOSPITAL_BASED_OUTPATIENT_CLINIC_OR_DEPARTMENT_OTHER)
Admission: RE | Admit: 2020-01-13 | Discharge: 2020-01-13 | Disposition: A | Discharge status: Home / Self Care | Payer: PPO | Source: Ambulatory Visit | Attending: Cardiology | Admitting: Cardiology

## 2020-01-13 ENCOUNTER — Encounter (HOSPITAL_COMMUNITY): Payer: Self-pay

## 2020-01-13 ENCOUNTER — Ambulatory Visit (HOSPITAL_COMMUNITY)
Admission: RE | Admit: 2020-01-13 | Discharge: 2020-01-13 | Disposition: A | Discharge status: Home / Self Care | Payer: PPO | Source: Ambulatory Visit | Attending: Cardiology | Admitting: Cardiology

## 2020-01-13 ENCOUNTER — Other Ambulatory Visit: Payer: Self-pay

## 2020-01-13 VITALS — HR 52 | Wt 198.6 lb

## 2020-01-13 DIAGNOSIS — I509 Heart failure, unspecified: Secondary | ICD-10-CM | POA: Diagnosis not present

## 2020-01-13 DIAGNOSIS — J439 Emphysema, unspecified: Secondary | ICD-10-CM | POA: Diagnosis not present

## 2020-01-13 DIAGNOSIS — E785 Hyperlipidemia, unspecified: Secondary | ICD-10-CM | POA: Diagnosis not present

## 2020-01-13 DIAGNOSIS — R590 Localized enlarged lymph nodes: Secondary | ICD-10-CM | POA: Diagnosis not present

## 2020-01-13 DIAGNOSIS — J90 Pleural effusion, not elsewhere classified: Secondary | ICD-10-CM | POA: Diagnosis not present

## 2020-01-13 DIAGNOSIS — I2510 Atherosclerotic heart disease of native coronary artery without angina pectoris: Secondary | ICD-10-CM | POA: Diagnosis not present

## 2020-01-13 DIAGNOSIS — K8020 Calculus of gallbladder without cholecystitis without obstruction: Secondary | ICD-10-CM | POA: Insufficient documentation

## 2020-01-13 DIAGNOSIS — R918 Other nonspecific abnormal finding of lung field: Secondary | ICD-10-CM | POA: Insufficient documentation

## 2020-01-13 DIAGNOSIS — I4891 Unspecified atrial fibrillation: Secondary | ICD-10-CM | POA: Diagnosis not present

## 2020-01-13 DIAGNOSIS — I700 Atherosclerosis of aorta: Secondary | ICD-10-CM | POA: Insufficient documentation

## 2020-01-13 DIAGNOSIS — Z85038 Personal history of other malignant neoplasm of large intestine: Secondary | ICD-10-CM | POA: Diagnosis not present

## 2020-01-13 DIAGNOSIS — I110 Hypertensive heart disease with heart failure: Secondary | ICD-10-CM | POA: Insufficient documentation

## 2020-01-13 NOTE — Progress Notes (Signed)
Patient seen in office today for EKG to see if he was still in afib. EKG shows sinus bradycardia. Per Dr.McLean continue current medication regimen no need for DCCV. Pt aware.

## 2020-01-13 NOTE — Progress Notes (Signed)
  Echocardiogram 2D Echocardiogram has been performed.  Jennette Dubin 01/13/2020, 10:45 AM

## 2020-01-14 ENCOUNTER — Ambulatory Visit (HOSPITAL_COMMUNITY)
Admission: RE | Admit: 2020-01-14 | Discharge: 2020-01-14 | Disposition: A | Discharge status: Home / Self Care | Payer: PPO | Source: Ambulatory Visit | Attending: Oncology | Admitting: Oncology

## 2020-01-14 ENCOUNTER — Inpatient Hospital Stay: Payer: PPO | Attending: Oncology

## 2020-01-14 ENCOUNTER — Encounter (HOSPITAL_COMMUNITY): Payer: Self-pay

## 2020-01-14 ENCOUNTER — Inpatient Hospital Stay: Payer: PPO | Admitting: Oncology

## 2020-01-14 ENCOUNTER — Other Ambulatory Visit: Payer: Self-pay

## 2020-01-14 ENCOUNTER — Other Ambulatory Visit: Payer: Self-pay | Admitting: Surgery

## 2020-01-14 VITALS — BP 154/64 | HR 60 | Temp 98.9°F | Resp 17 | Ht 73.0 in | Wt 205.4 lb

## 2020-01-14 DIAGNOSIS — I4891 Unspecified atrial fibrillation: Secondary | ICD-10-CM | POA: Diagnosis not present

## 2020-01-14 DIAGNOSIS — E0590 Thyrotoxicosis, unspecified without thyrotoxic crisis or storm: Secondary | ICD-10-CM | POA: Diagnosis not present

## 2020-01-14 DIAGNOSIS — C187 Malignant neoplasm of sigmoid colon: Secondary | ICD-10-CM

## 2020-01-14 DIAGNOSIS — Z7901 Long term (current) use of anticoagulants: Secondary | ICD-10-CM | POA: Insufficient documentation

## 2020-01-14 DIAGNOSIS — D500 Iron deficiency anemia secondary to blood loss (chronic): Secondary | ICD-10-CM | POA: Diagnosis not present

## 2020-01-14 DIAGNOSIS — C3490 Malignant neoplasm of unspecified part of unspecified bronchus or lung: Secondary | ICD-10-CM | POA: Diagnosis not present

## 2020-01-14 DIAGNOSIS — J449 Chronic obstructive pulmonary disease, unspecified: Secondary | ICD-10-CM | POA: Insufficient documentation

## 2020-01-14 DIAGNOSIS — C3431 Malignant neoplasm of lower lobe, right bronchus or lung: Secondary | ICD-10-CM | POA: Diagnosis not present

## 2020-01-14 DIAGNOSIS — R599 Enlarged lymph nodes, unspecified: Secondary | ICD-10-CM | POA: Insufficient documentation

## 2020-01-14 LAB — BASIC METABOLIC PANEL - CANCER CENTER ONLY
Anion gap: 8 (ref 5–15)
BUN: 16 mg/dL (ref 8–23)
CO2: 24 mmol/L (ref 22–32)
Calcium: 8.9 mg/dL (ref 8.9–10.3)
Chloride: 107 mmol/L (ref 98–111)
Creatinine: 1.28 mg/dL — ABNORMAL HIGH (ref 0.61–1.24)
GFR, Est AFR Am: >60 mL/min (ref >60)
GFR, Estimated: 53 mL/min — ABNORMAL LOW (ref >60)
Glucose, Bld: 116 mg/dL — ABNORMAL HIGH (ref 70–99)
Potassium: 4.8 mmol/L (ref 3.5–5.1)
Sodium: 139 mmol/L (ref 135–145)

## 2020-01-14 LAB — CBC WITH DIFFERENTIAL (CANCER CENTER ONLY)
Abs Immature Granulocytes: 0.03 10*3/uL (ref 0.00–0.07)
Basophils Absolute: 0 10*3/uL (ref 0.0–0.1)
Basophils Relative: 1 %
Eosinophils Absolute: 0.1 10*3/uL (ref 0.0–0.5)
Eosinophils Relative: 2 %
HCT: 36.9 % — ABNORMAL LOW (ref 39.0–52.0)
Hemoglobin: 11.9 g/dL — ABNORMAL LOW (ref 13.0–17.0)
Immature Granulocytes: 1 %
Lymphocytes Relative: 13 %
Lymphs Abs: 0.7 10*3/uL (ref 0.7–4.0)
MCH: 30.4 pg (ref 26.0–34.0)
MCHC: 32.2 g/dL (ref 30.0–36.0)
MCV: 94.4 fL (ref 80.0–100.0)
Monocytes Absolute: 0.3 10*3/uL (ref 0.1–1.0)
Monocytes Relative: 5 %
Neutro Abs: 4 10*3/uL (ref 1.7–7.7)
Neutrophils Relative %: 78 %
Platelet Count: 237 10*3/uL (ref 150–400)
RBC: 3.91 MIL/uL — ABNORMAL LOW (ref 4.22–5.81)
RDW: 14.8 % (ref 11.5–15.5)
WBC Count: 5.1 10*3/uL (ref 4.0–10.5)
nRBC: 0 % (ref 0.0–0.2)

## 2020-01-14 LAB — CEA (IN HOUSE-CHCC): CEA (CHCC-In House): <1 ng/mL (ref 0.00–5.00)

## 2020-01-14 MED ORDER — IOHEXOL 300 MG/ML  SOLN
75.0000 mL | Freq: Once | INTRAMUSCULAR | Status: AC | PRN
  Administered 2020-01-14: 75 mL via INTRAVENOUS

## 2020-01-14 MED ORDER — SODIUM CHLORIDE (PF) 0.9 % IJ SOLN
INTRAMUSCULAR | Status: AC
  Filled 2020-01-14: qty 50

## 2020-01-14 NOTE — Progress Notes (Signed)
[facility] OFFICE PROGRESS NOTE   Diagnosis: Colon cancer, non-small cell lung cancer  INTERVAL HISTORY:   Mr. Goldfarb returns as scheduled.  He feels well.  Good appetite.  No difficulty with bowel function.  No bleeding.  He reports pain at the lower back and left "hip "/thigh for the past month.  The pain is worse with ambulation and when lying in bed.  No pain at present.  Objective:  Vital signs in last 24 hours:  Blood pressure (!) 154/64, pulse 60, temperature 98.9 F (37.2 C), temperature source Temporal, resp. rate 17, height '6\' 1"'$  (1.854 m), weight 205 lb 6.4 oz (93.2 kg), SpO2 95 %.    Lymphatics: No cervical, supraclavicular, axillary, or inguinal nodes Resp: Decreased breath sounds at the right lower posterior chest, no respiratory distress Cardio: Regular rate and rhythm GI: No hepatosplenomegaly, no mass, nontender Vascular: Trace edema at the left greater than right lower leg Musculoskeletal: No spine tenderness, no pain with motion of the left hip Skin: Multiple benign-appearing moles over the trunk, no rash at the left lower back or upper thigh    Lab Results:  Lab Results  Component Value Date   WBC 5.1 01/14/2020   HGB 11.9 (L) 01/14/2020   HCT 36.9 (L) 01/14/2020   MCV 94.4 01/14/2020   PLT 237 01/14/2020   NEUTROABS 4.0 01/14/2020    CMP  Lab Results  Component Value Date   NA 139 01/14/2020   K 4.8 01/14/2020   CL 107 01/14/2020   CO2 24 01/14/2020   GLUCOSE 116 (H) 01/14/2020   BUN 16 01/14/2020   CREATININE 1.28 (H) 01/14/2020   CALCIUM 8.9 01/14/2020   PROT 7.3 12/09/2019   ALBUMIN 3.6 12/09/2019   AST 23 12/09/2019   ALT 22 12/09/2019   ALKPHOS 82 12/09/2019   BILITOT 0.8 12/09/2019   GFRNONAA 53 (L) 01/14/2020   GFRAA >60 01/14/2020    Lab Results  Component Value Date   CEA1 <1.00 01/14/2020     Imaging:  ECHOCARDIOGRAM COMPLETE  Result Date: 01/14/2020    ECHOCARDIOGRAM REPORT   Patient Name:   Nichalas Coin [name] Date of Exam: 01/13/2020 Medical Rec #:  [id number]              Height:       73.0 in Accession #:    [id number]             Weight:       199.0 lb Date of Birth:  01-14-1941             BSA:          2.147 m Patient Age:    79 years               BP:           104/52 mmHg Patient Gender: M                      HR:           55 bpm. Exam Location:  Outpatient Procedure: 2D Echo Indications:    Congestive Heart Failure I50.9  History:        Patient has prior history of Echocardiogram examinations, most                 recent 02/26/2018. PAD, Stroke and COPD, Arrythmias:Atrial  Fibrillation; Risk Factors:Hypertension and Dyslipidemia.  Sonographer:    Mikki Santee RDCS (AE) Referring Phys: [place]  Sonographer Comments: Suboptimal apical window. IMPRESSIONS  1. Left ventricular ejection fraction, by estimation, is 60 to 65%. The left ventricle has normal function. Left ventricular endocardial border not optimally defined to evaluate regional wall motion. Left ventricular diastolic parameters are indeterminate. Elevated left ventricular end-diastolic pressure.  2. Right ventricular systolic function is normal. The right ventricular size is normal. There is normal pulmonary artery systolic pressure.  3. The mitral valve is normal in structure. Trivial mitral valve regurgitation. No evidence of mitral stenosis.  4. The aortic valve is normal in structure. Aortic valve regurgitation is not visualized. No aortic stenosis is present.  5. The inferior vena cava is normal in size with greater than 50% respiratory variability, suggesting right atrial pressure of 3 mmHg.  6. Overall poor quality echo with poor acoustical windows. Best images that were in the subcostal views but overall LVF appears preserved. Cannot assess Regional wall motion with accuracy. LV filling pressures were elevated. FINDINGS  Left Ventricle: Left ventricular ejection fraction, by estimation, is 60 to  65%. The left ventricle has normal function. Left ventricular endocardial border not optimally defined to evaluate regional wall motion. The left ventricular internal cavity size was normal in size. There is no left ventricular hypertrophy. Left ventricular diastolic parameters are indeterminate. Elevated left ventricular end-diastolic pressure. Right Ventricle: The right ventricular size is normal. No increase in right ventricular wall thickness. Right ventricular systolic function is normal. There is normal pulmonary artery systolic pressure. The tricuspid regurgitant velocity is 2.14 m/s, and  with an assumed right atrial pressure of 3 mmHg, the estimated right ventricular systolic pressure is 50.5 mmHg. Left Atrium: Left atrial size was normal in size. Right Atrium: Right atrial size was normal in size. Pericardium: There is no evidence of pericardial effusion. Mitral Valve: The mitral valve is normal in structure. Normal mobility of the mitral valve leaflets. Trivial mitral valve regurgitation. No evidence of mitral valve stenosis. Tricuspid Valve: The tricuspid valve is normal in structure. Tricuspid valve regurgitation is mild . No evidence of tricuspid stenosis. Aortic Valve: The aortic valve is normal in structure. Aortic valve regurgitation is not visualized. No aortic stenosis is present. Pulmonic Valve: The pulmonic valve was normal in structure. Pulmonic valve regurgitation is not visualized. No evidence of pulmonic stenosis. Aorta: The aortic root is normal in size and structure. Venous: The inferior vena cava is normal in size with greater than 50% respiratory variability, suggesting right atrial pressure of 3 mmHg. IAS/Shunts: The interatrial septum appears to be lipomatous. No atrial level shunt detected by color flow Doppler.  LEFT VENTRICLE PLAX 2D LVIDd:         3.60 cm  Diastology LVIDs:         2.60 cm  LV e' lateral:   7.38 cm/s LV PW:         1.10 cm  LV E/e' lateral: 15.2 LV IVS:        1.10  cm  LV e' medial:    5.68 cm/s LVOT diam:     2.00 cm  LV E/e' medial:  19.7 LV SV:         84 LV SV Index:   39 LVOT Area:     3.14 cm  RIGHT VENTRICLE RV S prime:     9.24 cm/s TAPSE (M-mode): 2.0 cm LEFT ATRIUM  Index       RIGHT ATRIUM           Index LA diam:      3.80 cm 1.77 cm/m  RA Area:     16.10 cm LA Vol (A4C): 36.4 ml 16.95 ml/m RA Volume:   38.60 ml  17.98 ml/m  AORTIC VALVE LVOT Vmax:   105.00 cm/s LVOT Vmean:  71.800 cm/s LVOT VTI:    0.267 m  AORTA Ao Root diam: 2.90 cm MITRAL VALVE                TRICUSPID VALVE MV Area (PHT): 2.62 cm     TR Peak grad:   18.3 mmHg MV Decel Time: 289 msec     TR Vmax:        214.00 cm/s MV E velocity: 112.00 cm/s MV A velocity: 41.00 cm/s   SHUNTS MV E/A ratio:  2.73         Systemic VTI:  0.27 m                             Systemic Diam: 2.00 cm Fransico Him MD Electronically signed by Fransico Him MD Signature Date/Time: 01/14/2020/7:39:24 AM    Final     Medications: I have reviewed the patient's current medications.   Assessment/Plan:  1. Adenocarcinoma of sigmoid colon-stage II (T3N0)  Colonoscopy 06/25/2018- multiple polyps in the ascending and descending colon, partially obstructing mass in the sigmoid colon-biopsy confirmed adenocarcinoma  CTs 06/25/2018-enlarging right paratracheal lymph node compared to November 2018, no other evidence of metastatic disease  Low anterior resection 12/12/2018, stage II (T3N0) adenocarcinoma the sigmoid colon, no loss of mismatch repair protein expression, MSI-stable   2.  History of iron deficiency anemia Secondary to bleeding from tumor, exacerbated by anticoagulation with eliquis 3.Stage Ib squamous cell carcinoma of right lower lung s/pbronchoscopy, mediastinoscopy, right thoracoscopic lowerlobectomy 01/24/2011 -Path poorly differentiated squamous cell carcinoma with positive vascular invasion LN stations 4R, 4L, 7, 8, 9, 10, 11, and 12 w/o evidence of malignancy - pT2a,pN0,pMX stage  IB  -managed and monitored per Dr. Elenor Quinones at [facility]. Last Ct chest 05/01/18 stable, without concerning evidence of recurrence -CT 06/25/2018- nonspecific small bilateral nodules, enlarging superior right paratracheal node -EUS biopsy of the right paratracheal nodes (several rounded nodes measuring 4 cm in total) on 07/10/2018- negative for malignancy -CT chest 03/03/2019- stable postoperative appearance of the chest, unchanged mildly prominent right paratracheal lymph node, no evidence of metastatic disease  4. COPD 5. Atrial fibrillation 6. Thyrotoxicosis-potentially amiodarone related    Disposition: Mr. Stauber is in clinical remission from colon cancer and lung cancer.  A chest CT is pending from earlier today.  I reviewed the images with Mr. Luberto.  I see no obvious change in the chest images, though the right paratracheal node may be slightly larger compared to the CT from June 2020.  The hemoglobin is slightly lower today.  He will return for a CBC in 3 months.  He will be scheduled for an office visit in 6 months.  We will obtain a repeat chest CT in 8-9 months unless there are concerning findings on the final CT report today.  Betsy Coder, MD  01/14/2020  3:33 PM

## 2020-01-19 ENCOUNTER — Other Ambulatory Visit (HOSPITAL_COMMUNITY): Payer: Self-pay | Admitting: Cardiology

## 2020-01-19 ENCOUNTER — Telehealth: Payer: Self-pay | Admitting: Oncology

## 2020-01-19 DIAGNOSIS — R0602 Shortness of breath: Secondary | ICD-10-CM

## 2020-01-19 DIAGNOSIS — I509 Heart failure, unspecified: Secondary | ICD-10-CM

## 2020-01-19 NOTE — Telephone Encounter (Signed)
Scheduled per los. Called and left msg. Mailed printout  °

## 2020-01-20 ENCOUNTER — Telehealth (INDEPENDENT_AMBULATORY_CARE_PROVIDER_SITE_OTHER): Payer: PPO | Admitting: Internal Medicine

## 2020-01-20 ENCOUNTER — Telehealth: Payer: Self-pay

## 2020-01-20 ENCOUNTER — Encounter: Payer: Self-pay | Admitting: Internal Medicine

## 2020-01-20 ENCOUNTER — Ambulatory Visit (INDEPENDENT_AMBULATORY_CARE_PROVIDER_SITE_OTHER): Payer: PPO | Admitting: Psychiatry

## 2020-01-20 ENCOUNTER — Other Ambulatory Visit: Payer: Self-pay

## 2020-01-20 VITALS — BP 155/65 | HR 67 | Ht 73.0 in | Wt 190.0 lb

## 2020-01-20 DIAGNOSIS — G4733 Obstructive sleep apnea (adult) (pediatric): Secondary | ICD-10-CM | POA: Diagnosis not present

## 2020-01-20 DIAGNOSIS — D6869 Other thrombophilia: Secondary | ICD-10-CM

## 2020-01-20 DIAGNOSIS — I480 Paroxysmal atrial fibrillation: Secondary | ICD-10-CM

## 2020-01-20 DIAGNOSIS — I5032 Chronic diastolic (congestive) heart failure: Secondary | ICD-10-CM

## 2020-01-20 DIAGNOSIS — F4321 Adjustment disorder with depressed mood: Secondary | ICD-10-CM | POA: Diagnosis not present

## 2020-01-20 DIAGNOSIS — I4891 Unspecified atrial fibrillation: Secondary | ICD-10-CM

## 2020-01-20 NOTE — Telephone Encounter (Signed)
-----   Message from Thompson Grayer, MD sent at 01/20/2020  3:58 PM EDT ----- Afib ablation C/I/A  Cardiac CT

## 2020-01-20 NOTE — H&P (View-Only) (Signed)
Electrophysiology TeleHealth Note  Due to national recommendations of social distancing due to [place] 19, an audio telehealth visit is felt to be most appropriate for this patient at this time.  Verbal consent was obtained by me for the telehealth visit today.  The patient does not have capability for a virtual visit.  A phone visit is therefore required today.   Date:  01/20/2020   ID:  Troy Kindred [name], DOB January 23, 1941, MRN [id number]  Location: patient's home  Provider location:  [place]  Evaluation Performed: Follow-up visit  PCP:  Binnie Rail, MD   Electrophysiologist:  Dr Rayann Heman  Chief Complaint:  palpitations  History of Present Illness:    Troy Ku [name] is a 79 y.o. male who presents via telehealth conferencing today.  Since last being seen in our clinic, the patient reports doing very well.  He states that he is active.  He was weak when he saw Dr Aundra Dubin and was noted to be in afib at that time.  Today, he denies symptoms of palpitations, chest pain, shortness of breath,  lower extremity edema, dizziness, presyncope, or syncope.  The patient is otherwise without complaint today.     Past Medical History:  Diagnosis Date  . AAA (abdominal aortic aneurysm) ([place]) LAST ABDOMINAL US 10-20-17 3.3 CM   MONITORED BY DR [name]  . Anxiety   . Arthritis   . Atrial fibrillation ([place])   . Basal cell carcinoma   . Bilateral carotid artery stenosis DUPLEX 12-29-2012  BY DR [facility]   BILATERAL ICA STENOSIS 60-79%  . BPH (benign prostatic hypertrophy)   . Chronic diastolic heart failure ([place])   . Chronic kidney disease    stage 3, pt unaware  . Colon cancer ([place])   . Complication of anesthesia    had nausea after carotid artery surgery, states the medicine given to help the nausea, made it worse  . COPD (chronic obstructive pulmonary disease) ([place])   . Depression   . GERD (gastroesophageal reflux disease)   . Glaucoma BOTH EYES   Dr Gershon Crane  . History  of basal cell carcinoma excision   . History of lung cancer APRIL 2012  SQUAMOUS CELL---- S/P RIGHT LOWER LOBECTOMY AT [facility] --  NO CHEMORADIATION---  NO RECURRENCE    ONCOLOGIST- DR Tressie Stalker  LOV IN [facility] 10-27-2012  . History of pneumothorax    pt unaware  . Hx of adenomatous colonic polyps 2005    X 2; 1 hyperplastic polyp; Dr Olevia Perches  . Hyperlipidemia   . Hypertension   . Impaired fasting glucose 2007   108; A1c5.4%  . Lesion of bladder   . Lung cancer ([place]) 01/25/2012  . Microhematuria   . OSA (obstructive sleep apnea) 08/29/2015   CPAP SET ON 10  . PAD (peripheral artery disease) ([place]) ABI'S  JAN 2014  0.65 ON RIGHT ;  1.04 ON LEFT  . Peripheral vascular disease ([place]) S/P ANGIOPLASTY AND STENTING   FOLLOWED  BY DR [name]  . Pleural effusion on right 11/18/2018   Chronic, noted on CXR  . Spinal stenosis Sept. 2015  . Status post placement of implantable loop recorder   . Stroke [facility]) Jul 07, 2013   mini  TIA  . Thoracic aorta atherosclerosis ([place])   . Thyrotoxicosis    amiodarone induced  . Urgency of urination     Past Surgical History:  Procedure Laterality Date  . ANGIO PLASTY     X 4 in  legs  . AORTOGRAM  07-27-2002   MILD DIFFUSE ILIAC ARTERY OCCLUSIVE DISEASE /  LEFT RENAL ARTERY 20%/ PATENT LEFT FEM-POP GRAFT/ MILD SFA AND POPLITEAL ARTERY OCCLUSIVE DISEASE W/ SEVERE KIDNEY OCCLUSIVE DISEASE  . ATRIAL FIBRILLATION ABLATION N/A 04/09/2019   Procedure: ATRIAL FIBRILLATION ABLATION;  Surgeon: Thompson Grayer, MD;  Location: [place] [facility];  Service: Cardiovascular;  Laterality: N/A;  . BASAL CELL CARCINOMA EXCISION     MULTIPLE TIMES--  RIGHT FOREARM, CHEEKS, AND BACK  . BIOPSY  06/25/2018   Procedure: BIOPSY;  Surgeon: Rush Landmark Telford Nab., MD;  Location: [place];  Service: Gastroenterology;;  . CARDIOVASCULAR STRESS TEST  12-08-2012  DR MCLEAN   NORMAL LEXISCAN WITH NO EXERCISE NUCLEAR STUDY/ EF 66%/   NO ISCHEMIA/ NO SIGNIFICANT CHANGE FROM PRIOR STUDY  .  CARDIOVERSION N/A 02/14/2018   Procedure: CARDIOVERSION;  Surgeon: Larey Dresser, MD;  Location: [facility] ENDOSCOPY;  Service: Cardiovascular;  Laterality: N/A;  . CAROTID ANGIOGRAM N/A 07/10/2013   Procedure: CAROTID ANGIOGRAM;  Surgeon: Elam Dutch, MD;  Location: [facility] CATH LAB;  Service: Cardiovascular;  Laterality: N/A;  . CAROTID ENDARTERECTOMY Right 07-14-13   cea  . CATARACT EXTRACTION W/ INTRAOCULAR LENS  IMPLANT, BILATERAL    . colon polyectomy    . COLONOSCOPY WITH PROPOFOL N/A 06/25/2018   Procedure: COLONOSCOPY WITH PROPOFOL;  Surgeon: Rush Landmark Telford Nab., MD;  Location: [place];  Service: Gastroenterology;  Laterality: N/A;  . CYSTOSCOPY W/ RETROGRADES Bilateral 01/21/2013   Procedure: CYSTOSCOPY WITH RETROGRADE PYELOGRAM;  Surgeon: Molli Hazard, MD;  Location: [facility];  Service: Urology;  Laterality: Bilateral;   CYSTO, BLADDER BIOPSY, BILATERAL RETROGRADE PYELOGRAM  RAD TECH FROM RADIOLOGY PER JOY  . CYSTOSCOPY WITH BIOPSY N/A 01/21/2013   Procedure: CYSTOSCOPY WITH BIOPSY;  Surgeon: Molli Hazard, MD;  Location[facility];  Service: Urology;  Laterality: N/A;  . ENDARTERECTOMY Right 07/14/2013   Procedure: ENDARTERECTOMY CAROTID;  Surgeon: Angelia Mould, MD;  Location: [place];  Service: Vascular;  Laterality: Right;  . EP IMPLANTABLE DEVICE N/A 08/12/2015   Procedure: Loop Recorder Insertion;  Surgeon: Thompson Grayer, MD;  Location: [place] [facility];  Service: Cardiovascular;  Laterality: N/A;  . ESOPHAGOGASTRODUODENOSCOPY (EGD) WITH PROPOFOL N/A 06/25/2018   Procedure: ESOPHAGOGASTRODUODENOSCOPY (EGD) WITH PROPOFOL;  Surgeon: Rush Landmark Telford Nab., MD;  Location: [place];  Service: Gastroenterology;  Laterality: N/A;  . ESOPHAGOGASTRODUODENOSCOPY (EGD) WITH PROPOFOL N/A 07/10/2018   Procedure: ESOPHAGOGASTRODUODENOSCOPY (EGD) WITH PROPOFOL;  Surgeon: Milus Banister, MD;  Location: [facility] ENDOSCOPY;  Service:  Endoscopy;  Laterality: N/A;  . EUS N/A 07/10/2018   Procedure: UPPER ENDOSCOPIC ULTRASOUND (EUS) RADIAL;  Surgeon: Milus Banister, MD;  Location: [facility] ENDOSCOPY;  Service: Endoscopy;  Laterality: N/A;  . EYE SURGERY Right   . FEMORAL-POPLITEAL BYPASS GRAFT Left 1994  [facility]   AND 2001  IN [place]  . FEMORAL-POPLITEAL BYPASS GRAFT Left 08/30/2015   Procedure: REVISION OF BYPASS GRAFT Left  FEMORAL-POPLITEAL ARTERY;  Surgeon: Angelia Mould, MD;  Location: [place];  Service: Vascular;  Laterality: Left;  . FINE NEEDLE ASPIRATION N/A 07/10/2018   Procedure: FINE NEEDLE ASPIRATION (FNA) LINEAR;  Surgeon: Milus Banister, MD;  Location: [facility] ENDOSCOPY;  Service: Endoscopy;  Laterality: N/A;  . FLEXIBLE SIGMOIDOSCOPY N/A 12/12/2018   Procedure: FLEXIBLE SIGMOIDOSCOPY;  Surgeon: Ileana Roup, MD;  Location: [facility] ORS;  Service: General;  Laterality: N/A;  . LARYNGOSCOPY  06-27-2004   BX VOCAL CORD  (LEUKOPLAKIA)  PER PT NO ISSUES SINCE  . LEFT HEART CATH AND CORONARY ANGIOGRAPHY N/A 08/20/2017   Procedure: LEFT HEART CATH AND CORONARY ANGIOGRAPHY;  Surgeon: Larey Dresser, MD;  Location: [place] [facility];  Service: Cardiovascular;  Laterality: N/A;  . LOOP RECORDER INSERTION N/A 04/09/2019   Procedure: LOOP RECORDER INSERTION;  Surgeon: Thompson Grayer, MD;  Location: [place] [facility];  Service: Cardiovascular;  Laterality: N/A;  . LOOP RECORDER REMOVAL N/A 04/09/2019   Procedure: LOOP RECORDER REMOVAL;  Surgeon: Thompson Grayer, MD;  Location: [place] [facility];  Service: Cardiovascular;  Laterality: N/A;  . LOWER EXTREMITY ANGIOGRAM Bilateral 08/29/2015   Procedure: Lower Extremity Angiogram;  Surgeon: Angelia Mould, MD;  Location: [place] [facility];  Service: Cardiovascular;  Laterality: Bilateral;  . LUNG LOBECTOMY  01/24/2011    RIGHT UPPER LOBE  (SQUAMOUS CELL CARCINOMA) Dr Dorthea Cove , [facility]. No chemotherapyor radiation  . PATCH ANGIOPLASTY Right 07/14/2013   Procedure:  PATCH ANGIOPLASTY;  Surgeon: Angelia Mould, MD;  Location: [place];  Service: Vascular;  Laterality: Right;  . PATCH ANGIOPLASTY Left 08/30/2015   Procedure: VEIN PATCH ANGIOPLASTY OF PROXIMAL Left BYPASS GRAFT;  Surgeon: Angelia Mould, MD;  Location: [place];  Service: Vascular;  Laterality: Left;  . PERIPHERAL VASCULAR CATHETERIZATION N/A 08/29/2015   Procedure: Abdominal Aortogram;  Surgeon: Angelia Mould, MD;  Location: [place] [facility];  Service: Cardiovascular;  Laterality: N/A;  . POLYPECTOMY  06/25/2018   Procedure: POLYPECTOMY;  Surgeon: Rush Landmark Telford Nab., MD;  Location: [place];  Service: Gastroenterology;;  . Lia Foyer INJECTION  06/25/2018   Procedure: SUBMUCOSAL INJECTION;  Surgeon: Irving Copas., MD;  Location: [place];  Service: Gastroenterology;;  . TEE WITHOUT CARDIOVERSION N/A 02/14/2018   Procedure: TRANSESOPHAGEAL ECHOCARDIOGRAM (TEE);  Surgeon: Larey Dresser, MD;  Location: [facility] ENDOSCOPY;  Service: Cardiovascular;  Laterality: N/A;  . trabecular surgery     OS  . TRANSTHORACIC ECHOCARDIOGRAM  12-29-2012  DR [facility]   MILD LVH/  LVSF NORMAL/ EF 64-33%/  GRADE I DIASTOLIC DYSFUNCTION    Current Outpatient Medications  Medication Sig Dispense Refill  . acetaminophen (TYLENOL) 325 MG tablet Take 325 mg by mouth every 6 (six) hours as needed for moderate pain or headache.     [name] amLODipine (NORVASC) 5 MG tablet TAKE 1 TABLET ONCE DAILY. 30 tablet 0  . Ascorbic Acid (VITAMIN C PO) Take 1 tablet by mouth daily.    . AZOPT 1 % ophthalmic suspension Place 1 drop into both eyes in the morning and at bedtime.     . bimatoprost (LUMIGAN) 0.01 % SOLN Place 1 drop at bedtime into both eyes.     [name] docusate sodium (COLACE) 100 MG capsule Take 100 mg by mouth 2 (two) times daily.     [name] ELIQUIS 5 MG TABS tablet TAKE 1 TABLET BY MOUTH TWICE DAILY. 60 tablet 0  . ferrous sulfate 325 (65 FE) MG tablet Take 325 mg by mouth at bedtime.     [name]  FLUoxetine (PROZAC) 40 MG capsule Take 1 capsule (40 mg total) by mouth daily. 90 capsule 3  . furosemide (LASIX) 20 MG tablet TAKE 2 TABLETS EACH MORNING. 60 tablet 0  . losartan (COZAAR) 25 MG tablet Take 2 tablets (50 mg total) by mouth daily. 60 tablet 5  . Magnesium 500 MG CAPS Take 500 mg by mouth daily.     . Melatonin 5 MG TABS Take 5 mg by mouth at bedtime.    [name]  metoprolol succinate (TOPROL-XL) 25 MG 24 hr tablet Take 0.5 tablets (12.5 mg total) by mouth daily. Please call for office visit [phone] (Patient taking differently: Take 12.5 mg by mouth at bedtime. ) 14 tablet 0  . montelukast (SINGULAIR) 10 MG tablet TAKE ONE TABLET AT BEDTIME. (Patient taking differently: Take 10 mg by mouth at bedtime. ) 30 tablet 5  . Multiple Vitamins-Minerals (CENTRUM SILVER PO) Take 1 tablet by mouth daily.    . Multiple Vitamins-Minerals (ZINC PO) Take 1 tablet by mouth daily.    . Na Sulfate-K Sulfate-Mg Sulf (SUPREP BOWEL PREP KIT) 17.5-3.13-1.6 GM/177ML SOLN Take 1 kit by mouth as directed. For colonoscopy prep 354 mL 0  . omeprazole (PRILOSEC) 20 MG capsule Take 1 capsule (20 mg total) by mouth 2 (two) times daily before a meal. Take 30- 60 min before your first and last meals of the day 180 capsule 1  . Polyethyl Glycol-Propyl Glycol (SYSTANE) 0.4-0.3 % SOLN Place 1 drop into both eyes daily as needed (for dry eyes).     . potassium chloride SA (KLOR-CON) 20 MEQ tablet TAKE 1 TABLET ONCE DAILY. 30 tablet 0  . rosuvastatin (CRESTOR) 40 MG tablet TAKE 1 TABLET BY MOUTH DAILY. (Patient taking differently: Take 40 mg by mouth daily. ) 90 tablet 0  . senna (SENOKOT) 8.6 MG tablet Take 1 tablet by mouth 2 (two) times daily.    . SYMBICORT 160-4.5 MCG/ACT inhaler USE 2 PUFFS TWICE DAILY. (Patient taking differently: Inhale 2 puffs into the lungs in the morning and at bedtime. ) 10.2 g 5   No current facility-administered medications for this visit.    Allergies:   Niacin-lovastatin er, Penicillins,  Sulfonamide derivatives, and Atorvastatin   Social History:  The patient  reports that he quit smoking about 5 years ago. His smoking use included cigarettes. He has a 25.00 pack-year smoking history. He has never used smokeless tobacco. He reports current alcohol use. He reports that he does not use drugs.   ROS:  Please see the history of present illness.   All other systems are personally reviewed and negative.    Exam:    Vital Signs:  BP (!) 155/65   Pulse 67   Ht 6' 1" (1.854 m)   Wt 190 lb (86.2 kg)   BMI 25.07 kg/m   Well sounding, alert and conversant   Labs/Other Tests and Data Reviewed:    Recent Labs: 11/30/2019: TSH 2.48 12/09/2019: ALT 22 01/06/2020: B Natriuretic Peptide 523.6 01/14/2020: BUN 16; Creatinine 1.28; Hemoglobin 11.9; Platelet Count 237; Potassium 4.8; Sodium 139   Wt Readings from Last 3 Encounters:  01/20/20 190 lb (86.2 kg)  01/14/20 205 lb 6.4 oz (93.2 kg)  01/13/20 198 lb 9 oz (90.1 kg)     Last device remote is reviewed from [place] PDF which reveals normal device function, afib burden has increased in past 2 months   ASSESSMENT & PLAN:    1.  Paroxysmal atrial fibrillation  The patient has symptomatic, recurrent paroyxsmal atrial fibrillation. he has failed medical therapy.  He underwent afib ablation by me 04/2019.  He did well initially but has begun having additional atrial fibrillation events.   he is anticoagulated with eliquis .  chads2vasc score is at least 6 Therapeutic strategies for afib including medicine and ablation were discussed in detail with the patient today. Risk, benefits, and alternatives to EP study and radiofrequency ablation for afib were also discussed in detail today. These risks include but are  not limited to stroke, bleeding, vascular damage, tamponade, perforation, damage to the esophagus, lungs, and other structures, pulmonary vein stenosis, worsening renal function, and death. The patient understands these risk and  wishes to proceed.  We will therefore proceed with catheter ablation at the next available time.  Carto, ICE, anesthesia are requested for the procedure.  Will also obtain cardiac CT prior to the procedure to exclude LAA thrombus and further evaluate atrial anatomy.  2. OSA Compliance with CPAP is encouraged  3. Chronic diastolic dysfunction Stable No change required today   Patient Risk:  after full review of this patients clinical status, I feel that they are at moderate risk at this time.  Today, I have spent 15 minutes with the patient with telehealth technology discussing arrhythmia management .    Army Fossa, MD  01/20/2020 3:54 PM     [facility] [address] [place] [address] [phone] (office) [phone] (fax)

## 2020-01-20 NOTE — Progress Notes (Signed)
Psychotherapy Progress Note [facility] Luan Moore, PhD LP  Patient ID: Troy Kindred [name]     MRN: [id number] Therapy format: Individual psychotherapy Date: 01/20/2020      Start: 2:14p     Stop: 2:55p     Time Spent: 41 min Location: In-person   Session narrative (presenting needs, interim history, self-report of stressors and symptoms, applications of prior therapy, status changes, and interventions made in session) Was going to bring Troy [name], but she is recovering from eye surgery.  Doing OK, personally, busy/active and not napping every afternoon.  Having things to get out for and meetings (including health care) to occupy him seem to be helping.  Today incl cancer followup (lung and colon, in long remission).  Colonoscopy early next month.  No hx chemo or radiation.   Tires of hearing too much news, including [employer], but hard to find much on TV daytime of interest.  Will check PBS, History Channel.  Audio books are helpful, listens 8-10 or so.  Good weather providing some opportunities to get outside.  Overall, pretty satisfied what he can do with his days.  Been busy working out a trust for wife and kids that triggered an IRS audit and got delayed.  Some apprehension about whether stock market will turn on him for the funds he is about to place, and he is readying to change trustees.  Tells of his oldest daughter coming around in perspective and maturity about having a trust fund.  Satisfied with all the kids' maturity, just at issue whether a stock sale to the trust was undervalued enough to constitute a large gift, for tax purposes, and getting ready to go ahead and settle a tax bill.  Morale good, no complaints of feeling shut it too much, only concerns for politics, social issues, and the quality of national administration.  Would like to maintain 1/mo service and re-invite Troy [name] after her recovery.  Therapeutic modalities: Cognitive Behavioral Therapy,  Solution-Oriented/Positive Psychology and Ego-Supportive  Mental Status/Observations:  Appearance:   Casual and Neat     Behavior:  Appropriate  Motor:  Normal  Speech/Language:   Clear and Coherent  Affect:  Appropriate  Mood:  euthymic  Thought process:  normal  Thought content:    WNL  Sensory/Perceptual disturbances:    WNL  Orientation:  Fully oriented  Attention:  Good    Concentration:  Good  Memory:  WNL  Insight:    Good  Judgment:   Good  Impulse Control:  Good   Risk Assessment: Danger to Self: No Self-injurious Behavior: No Danger to Others: No Physical Aggression / Violence: No Duty to Warn: No Access to Firearms a concern: No  Assessment of progress:  progressing well  Diagnosis:   ICD-10-CM   1. Adjustment disorder with depressed mood  F43.21    Plan:  . Continue with audio books and other non-sight-intensive recreational options  . Invite Troy [name] next month to reassess needs and adjustment . Likely relapse prevention planning re. next fall/winter and any foreseeable declines in eyesight, opportunity, or sister's welfare . Other recommendations/advice as may be noted above . Continue to utilize previously learned skills ad lib . Maintain medication as prescribed and work faithfully with relevant prescriber(s) if any changes are desired or seem indicated . Call the clinic on-call service, present to ER, or call 911 if any life-threatening psychiatric crisis Return in about 1 month (around 02/19/2020). . Already scheduled visit in this office Visit date not found.  Blanchie Serve, PhD Luan Moore, PhD LP Clinical Psychologist, [facility] [address], [place], [place] [address] [phone]

## 2020-01-20 NOTE — Progress Notes (Signed)
   Electrophysiology TeleHealth Note  Due to national recommendations of social distancing due to COVID 19, an audio telehealth visit is felt to be most appropriate for this patient at this time.  Verbal consent was obtained by me for the telehealth visit today.  The patient does not have capability for a virtual visit.  A phone visit is therefore required today.   Date:  01/20/2020   ID:  Troy Douglas Maxfield [name], DOB 03/19/1941, MRN [id number]  Location: patient's home  Provider location:  [employer]  Evaluation Performed: Follow-up visit  PCP:  Burns, Stacy J, MD   Electrophysiologist:  Dr Jhoan Schmieder  Chief Complaint:  palpitations  History of Present Illness:    Troy Douglas Bellina [name] is a 78 y.o. male who presents via telehealth conferencing today.  Since last being seen in our clinic, the patient reports doing very well.  He states that he is active.  He was weak when he saw Dr McLean and was noted to be in afib at that time.  Today, he denies symptoms of palpitations, chest pain, shortness of breath,  lower extremity edema, dizziness, presyncope, or syncope.  The patient is otherwise without complaint today.     Past Medical History:  Diagnosis Date  . AAA (abdominal aortic aneurysm) (HCC) LAST ABDOMINAL US 10-20-17 3.3 CM   MONITORED BY DR DICKSON  . Anxiety   . Arthritis   . Atrial fibrillation (HCC)   . Basal cell carcinoma   . Bilateral carotid artery stenosis DUPLEX 12-29-2012  BY DR MCLEAN   BILATERAL ICA STENOSIS 60-79%  . BPH (benign prostatic hypertrophy)   . Chronic diastolic heart failure (HCC)   . Chronic kidney disease    stage 3, pt unaware  . Colon cancer (HCC)   . Complication of anesthesia    had nausea after carotid artery surgery, states the medicine given to help the nausea, made it worse  . COPD (chronic obstructive pulmonary disease) (HCC)   . Depression   . GERD (gastroesophageal reflux disease)   . Glaucoma BOTH EYES   Dr Shapiro  . History  of basal cell carcinoma excision   . History of lung cancer APRIL 2012  SQUAMOUS CELL---- S/P RIGHT LOWER LOBECTOMY AT [facility] --  NO CHEMORADIATION---  NO RECURRENCE    ONCOLOGIST- DR NEIJSTROM  LOV IN EPIC 10-27-2012  . History of pneumothorax    pt unaware  . Hx of adenomatous colonic polyps 2005    X 2; 1 hyperplastic polyp; Dr Brodie  . Hyperlipidemia   . Hypertension   . Impaired fasting glucose 2007   108; A1c5.4%  . Lesion of bladder   . Lung cancer (HCC) 01/25/2012  . Microhematuria   . OSA (obstructive sleep apnea) 08/29/2015   CPAP SET ON 10  . PAD (peripheral artery disease) (HCC) ABI'S  JAN 2014  0.65 ON RIGHT ;  1.04 ON LEFT  . Peripheral vascular disease (HCC) S/P ANGIOPLASTY AND STENTING   FOLLOWED  BY DR DICKSON  . Pleural effusion on right 11/18/2018   Chronic, noted on CXR  . Spinal stenosis Sept. 2015  . Status post placement of implantable loop recorder   . Stroke (HCC) Jul 07, 2013   mini  TIA  . Thoracic aorta atherosclerosis (HCC)   . Thyrotoxicosis    amiodarone induced  . Urgency of urination     Past Surgical History:  Procedure Laterality Date  . ANGIO PLASTY     X 4 in   legs  . AORTOGRAM  07-27-2002   MILD DIFFUSE ILIAC ARTERY OCCLUSIVE DISEASE /  LEFT RENAL ARTERY 20%/ PATENT LEFT FEM-POP GRAFT/ MILD SFA AND POPLITEAL ARTERY OCCLUSIVE DISEASE W/ SEVERE KIDNEY OCCLUSIVE DISEASE  . ATRIAL FIBRILLATION ABLATION N/A 04/09/2019   Procedure: ATRIAL FIBRILLATION ABLATION;  Surgeon: Evangelia Whitaker, MD;  [facility];  Service: Cardiovascular;  Laterality: N/A;  . BASAL CELL CARCINOMA EXCISION     MULTIPLE TIMES--  RIGHT FOREARM, CHEEKS, AND BACK  . BIOPSY  06/25/2018   Procedure: BIOPSY;  Surgeon: Mansouraty, Gabriel Jr., MD;  [facility];  Service: Gastroenterology;;  . CARDIOVASCULAR STRESS TEST  12-08-2012  DR MCLEAN   NORMAL LEXISCAN WITH NO EXERCISE NUCLEAR STUDY/ EF 66%/   NO ISCHEMIA/ NO SIGNIFICANT CHANGE FROM PRIOR STUDY  .  CARDIOVERSION N/A 02/14/2018   Procedure: CARDIOVERSION;  Surgeon: McLean, Dalton S, MD;  [facility];  Service: Cardiovascular;  Laterality: N/A;  . CAROTID ANGIOGRAM N/A 07/10/2013   Procedure: CAROTID ANGIOGRAM;  Surgeon: Charles E Fields, MD;  [facility];  Service: Cardiovascular;  Laterality: N/A;  . CAROTID ENDARTERECTOMY Right 07-14-13   cea  . CATARACT EXTRACTION W/ INTRAOCULAR LENS  IMPLANT, BILATERAL    . colon polyectomy    . COLONOSCOPY WITH PROPOFOL N/A 06/25/2018   Procedure: COLONOSCOPY WITH PROPOFOL;  Surgeon: Mansouraty, Gabriel Jr., MD;  [facility];  Service: Gastroenterology;  Laterality: N/A;  . CYSTOSCOPY W/ RETROGRADES Bilateral 01/21/2013   Procedure: CYSTOSCOPY WITH RETROGRADE PYELOGRAM;  Surgeon: Daniel Young Woodruff, MD;  Location: [facility];  Service: Urology;  Laterality: Bilateral;   CYSTO, BLADDER BIOPSY, BILATERAL RETROGRADE PYELOGRAM  RAD TECH FROM RADIOLOGY PER JOY  . CYSTOSCOPY WITH BIOPSY N/A 01/21/2013   Procedure: CYSTOSCOPY WITH BIOPSY;  Surgeon: Daniel Young Woodruff, MD;  Location: [facility];  Service: Urology;  Laterality: N/A;  . ENDARTERECTOMY Right 07/14/2013   Procedure: ENDARTERECTOMY CAROTID;  Surgeon: Christopher S Dickson, MD;  [facility];  Service: Vascular;  Laterality: Right;  . EP IMPLANTABLE DEVICE N/A 08/12/2015   Procedure: Loop Recorder Insertion;  Surgeon: Jhonnie Aliano, MD;  [facility];  Service: Cardiovascular;  Laterality: N/A;  . ESOPHAGOGASTRODUODENOSCOPY (EGD) WITH PROPOFOL N/A 06/25/2018   Procedure: ESOPHAGOGASTRODUODENOSCOPY (EGD) WITH PROPOFOL;  Surgeon: Mansouraty, Gabriel Jr., MD;  [facility];  Service: Gastroenterology;  Laterality: N/A;  . ESOPHAGOGASTRODUODENOSCOPY (EGD) WITH PROPOFOL N/A 07/10/2018   Procedure: ESOPHAGOGASTRODUODENOSCOPY (EGD) WITH PROPOFOL;  Surgeon: Jacobs, Daniel P, MD;  [facility];  Service:  Endoscopy;  Laterality: N/A;  . EUS N/A 07/10/2018   Procedure: UPPER ENDOSCOPIC ULTRASOUND (EUS) RADIAL;  Surgeon: Jacobs, Daniel P, MD;  [facility];  Service: Endoscopy;  Laterality: N/A;  . EYE SURGERY Right   . FEMORAL-POPLITEAL BYPASS GRAFT Left 1994  [facility]   AND 2001  IN [place]  . FEMORAL-POPLITEAL BYPASS GRAFT Left 08/30/2015   Procedure: REVISION OF BYPASS GRAFT Left  FEMORAL-POPLITEAL ARTERY;  Surgeon: Christopher S Dickson, MD;  [facility];  Service: Vascular;  Laterality: Left;  . FINE NEEDLE ASPIRATION N/A 07/10/2018   Procedure: FINE NEEDLE ASPIRATION (FNA) LINEAR;  Surgeon: Jacobs, Daniel P, MD;  [facility];  Service: Endoscopy;  Laterality: N/A;  . FLEXIBLE SIGMOIDOSCOPY N/A 12/12/2018   Procedure: FLEXIBLE SIGMOIDOSCOPY;  Surgeon: White, Christopher M, MD;  Location: [facility];  Service: General;  Laterality: N/A;  . LARYNGOSCOPY  06-27-2004   BX VOCAL CORD  (LEUKOPLAKIA)    PER PT NO ISSUES SINCE  . LEFT HEART CATH AND CORONARY ANGIOGRAPHY N/A 08/20/2017   Procedure: LEFT HEART CATH AND CORONARY ANGIOGRAPHY;  Surgeon: Larey Dresser, MD;  Location: [place] [facility];  Service: Cardiovascular;  Laterality: N/A;  . LOOP RECORDER INSERTION N/A 04/09/2019   Procedure: LOOP RECORDER INSERTION;  Surgeon: Thompson Grayer, MD;  Location: [place] [facility];  Service: Cardiovascular;  Laterality: N/A;  . LOOP RECORDER REMOVAL N/A 04/09/2019   Procedure: LOOP RECORDER REMOVAL;  Surgeon: Thompson Grayer, MD;  Location: [place] [facility];  Service: Cardiovascular;  Laterality: N/A;  . LOWER EXTREMITY ANGIOGRAM Bilateral 08/29/2015   Procedure: Lower Extremity Angiogram;  Surgeon: Angelia Mould, MD;  Location: [place] [facility];  Service: Cardiovascular;  Laterality: Bilateral;  . LUNG LOBECTOMY  01/24/2011    RIGHT UPPER LOBE  (SQUAMOUS CELL CARCINOMA) Dr Dorthea Cove , [facility]. No chemotherapyor radiation  . PATCH ANGIOPLASTY Right 07/14/2013   Procedure:  PATCH ANGIOPLASTY;  Surgeon: Angelia Mould, MD;  Location: [place];  Service: Vascular;  Laterality: Right;  . PATCH ANGIOPLASTY Left 08/30/2015   Procedure: VEIN PATCH ANGIOPLASTY OF PROXIMAL Left BYPASS GRAFT;  Surgeon: Angelia Mould, MD;  Location: [place];  Service: Vascular;  Laterality: Left;  . PERIPHERAL VASCULAR CATHETERIZATION N/A 08/29/2015   Procedure: Abdominal Aortogram;  Surgeon: Angelia Mould, MD;  Location: [place] [facility];  Service: Cardiovascular;  Laterality: N/A;  . POLYPECTOMY  06/25/2018   Procedure: POLYPECTOMY;  Surgeon: Rush Landmark Telford Nab., MD;  Location: [place];  Service: Gastroenterology;;  . Lia Foyer INJECTION  06/25/2018   Procedure: SUBMUCOSAL INJECTION;  Surgeon: Irving Copas., MD;  Location: [place];  Service: Gastroenterology;;  . TEE WITHOUT CARDIOVERSION N/A 02/14/2018   Procedure: TRANSESOPHAGEAL ECHOCARDIOGRAM (TEE);  Surgeon: Larey Dresser, MD;  Location: [facility];  Service: Cardiovascular;  Laterality: N/A;  . trabecular surgery     OS  . TRANSTHORACIC ECHOCARDIOGRAM  12-29-2012  DR [facility]   MILD LVH/  LVSF NORMAL/ EF 64-33%/  GRADE I DIASTOLIC DYSFUNCTION    Current Outpatient Medications  Medication Sig Dispense Refill  . acetaminophen (TYLENOL) 325 MG tablet Take 325 mg by mouth every 6 (six) hours as needed for moderate pain or headache.     [name] amLODipine (NORVASC) 5 MG tablet TAKE 1 TABLET ONCE DAILY. 30 tablet 0  . Ascorbic Acid (VITAMIN C PO) Take 1 tablet by mouth daily.    . AZOPT 1 % ophthalmic suspension Place 1 drop into both eyes in the morning and at bedtime.     . bimatoprost (LUMIGAN) 0.01 % SOLN Place 1 drop at bedtime into both eyes.     [name] docusate sodium (COLACE) 100 MG capsule Take 100 mg by mouth 2 (two) times daily.     [name] ELIQUIS 5 MG TABS tablet TAKE 1 TABLET BY MOUTH TWICE DAILY. 60 tablet 0  . ferrous sulfate 325 (65 FE) MG tablet Take 325 mg by mouth at bedtime.     [name]  FLUoxetine (PROZAC) 40 MG capsule Take 1 capsule (40 mg total) by mouth daily. 90 capsule 3  . furosemide (LASIX) 20 MG tablet TAKE 2 TABLETS EACH MORNING. 60 tablet 0  . losartan (COZAAR) 25 MG tablet Take 2 tablets (50 mg total) by mouth daily. 60 tablet 5  . Magnesium 500 MG CAPS Take 500 mg by mouth daily.     . Melatonin 5 MG TABS Take 5 mg by mouth at bedtime.    [name]  metoprolol succinate (TOPROL-XL) 25 MG 24 hr tablet Take 0.5 tablets (12.5 mg total) by mouth daily. Please call for office visit [phone] (Patient taking differently: Take 12.5 mg by mouth at bedtime. ) 14 tablet 0  . montelukast (SINGULAIR) 10 MG tablet TAKE ONE TABLET AT BEDTIME. (Patient taking differently: Take 10 mg by mouth at bedtime. ) 30 tablet 5  . Multiple Vitamins-Minerals (CENTRUM SILVER PO) Take 1 tablet by mouth daily.    . Multiple Vitamins-Minerals (ZINC PO) Take 1 tablet by mouth daily.    . Na Sulfate-K Sulfate-Mg Sulf (SUPREP BOWEL PREP KIT) 17.5-3.13-1.6 GM/177ML SOLN Take 1 kit by mouth as directed. For colonoscopy prep 354 mL 0  . omeprazole (PRILOSEC) 20 MG capsule Take 1 capsule (20 mg total) by mouth 2 (two) times daily before a meal. Take 30- 60 min before your first and last meals of the day 180 capsule 1  . Polyethyl Glycol-Propyl Glycol (SYSTANE) 0.4-0.3 % SOLN Place 1 drop into both eyes daily as needed (for dry eyes).     . potassium chloride SA (KLOR-CON) 20 MEQ tablet TAKE 1 TABLET ONCE DAILY. 30 tablet 0  . rosuvastatin (CRESTOR) 40 MG tablet TAKE 1 TABLET BY MOUTH DAILY. (Patient taking differently: Take 40 mg by mouth daily. ) 90 tablet 0  . senna (SENOKOT) 8.6 MG tablet Take 1 tablet by mouth 2 (two) times daily.    . SYMBICORT 160-4.5 MCG/ACT inhaler USE 2 PUFFS TWICE DAILY. (Patient taking differently: Inhale 2 puffs into the lungs in the morning and at bedtime. ) 10.2 g 5   No current facility-administered medications for this visit.    Allergies:   Niacin-lovastatin er, Penicillins,  Sulfonamide derivatives, and Atorvastatin   Social History:  The patient  reports that he quit smoking about 5 years ago. His smoking use included cigarettes. He has a 25.00 pack-year smoking history. He has never used smokeless tobacco. He reports current alcohol use. He reports that he does not use drugs.   ROS:  Please see the history of present illness.   All other systems are personally reviewed and negative.    Exam:    Vital Signs:  BP (!) 155/65   Pulse 67   Ht 6' 1" (1.854 m)   Wt 190 lb (86.2 kg)   BMI 25.07 kg/m   Well sounding, alert and conversant   Labs/Other Tests and Data Reviewed:    Recent Labs: 11/30/2019: TSH 2.48 12/09/2019: ALT 22 01/06/2020: B Natriuretic Peptide 523.6 01/14/2020: BUN 16; Creatinine 1.28; Hemoglobin 11.9; Platelet Count 237; Potassium 4.8; Sodium 139   Wt Readings from Last 3 Encounters:  01/20/20 190 lb (86.2 kg)  01/14/20 205 lb 6.4 oz (93.2 kg)  01/13/20 198 lb 9 oz (90.1 kg)     Last device remote is reviewed from [facility] PDF which reveals normal device function, afib burden has increased in past 2 months   ASSESSMENT & PLAN:    1.  Paroxysmal atrial fibrillation  The patient has symptomatic, recurrent paroyxsmal atrial fibrillation. he has failed medical therapy.  He underwent afib ablation by me 04/2019.  He did well initially but has begun having additional atrial fibrillation events.   he is anticoagulated with eliquis .  chads2vasc score is at least 6 Therapeutic strategies for afib including medicine and ablation were discussed in detail with the patient today. Risk, benefits, and alternatives to EP study and radiofrequency ablation for afib were also discussed in detail today. These risks include but are  not limited to stroke, bleeding, vascular damage, tamponade, perforation, damage to the esophagus, lungs, and other structures, pulmonary vein stenosis, worsening renal function, and death. The patient understands these risk and  wishes to proceed.  We will therefore proceed with catheter ablation at the next available time.  Carto, ICE, anesthesia are requested for the procedure.  Will also obtain cardiac CT prior to the procedure to exclude LAA thrombus and further evaluate atrial anatomy.  2. OSA Compliance with CPAP is encouraged  3. Chronic diastolic dysfunction Stable No change required today   Patient Risk:  after full review of this patients clinical status, I feel that they are at moderate risk at this time.  Today, I have spent 15 minutes with the patient with telehealth technology discussing arrhythmia management .    Signed,  , MD  01/20/2020 3:54 PM     [facility] [address] [place] [address] [phone] (office) [phone] (fax)  

## 2020-01-22 NOTE — Telephone Encounter (Signed)
Pt scheduled for afib ablation on Feb 12, 2020 at 1030 am  Labs up to date  Scheduled covid test  Instruction letters sent via mychart and briefly discussed with family.  Work up complete.

## 2020-01-29 ENCOUNTER — Other Ambulatory Visit: Payer: Self-pay

## 2020-01-29 ENCOUNTER — Ambulatory Visit
Admission: RE | Admit: 2020-01-29 | Discharge: 2020-01-29 | Disposition: A | Discharge status: Home / Self Care | Payer: PPO | Source: Ambulatory Visit | Attending: Surgery | Admitting: Surgery

## 2020-01-29 DIAGNOSIS — C187 Malignant neoplasm of sigmoid colon: Secondary | ICD-10-CM

## 2020-01-29 DIAGNOSIS — K8020 Calculus of gallbladder without cholecystitis without obstruction: Secondary | ICD-10-CM | POA: Diagnosis not present

## 2020-01-29 MED ORDER — IOPAMIDOL (ISOVUE-300) INJECTION 61%
100.0000 mL | Freq: Once | INTRAVENOUS | Status: AC | PRN
  Administered 2020-01-29: 100 mL via INTRAVENOUS

## 2020-02-01 ENCOUNTER — Telehealth: Payer: Self-pay | Admitting: Internal Medicine

## 2020-02-01 NOTE — Telephone Encounter (Signed)
[name], Wife of the patient would like someone from the office to call her to discuss pre-procedure instructions for his CT scheduled for 02-08-20

## 2020-02-01 NOTE — Telephone Encounter (Signed)
Returned call to Pt's wife.  Went over CT instructions.  All questions answered.

## 2020-02-04 ENCOUNTER — Ambulatory Visit (HOSPITAL_COMMUNITY): Payer: PPO | Admitting: Physician Assistant

## 2020-02-04 LAB — CUP PACEART REMOTE DEVICE CHECK
Date Time Interrogation Session: 20210502014458
Implantable Pulse Generator Implant Date: 20200709

## 2020-02-05 ENCOUNTER — Telehealth (HOSPITAL_COMMUNITY): Payer: Self-pay | Admitting: *Deleted

## 2020-02-05 NOTE — Telephone Encounter (Signed)
Reaching out to patient to offer assistance regarding upcoming cardiac imaging study; pt's wife verbalizes understanding of appt date/time, parking situation and where to check in, pre-test NPO status and medications ordered, and verified current allergies; name and call back number provided for further questions should they arise ° °Aleesa Sweigert Tai RN Navigator Cardiac Imaging °[facility] °[phone] office °[phone] cell ° °

## 2020-02-08 ENCOUNTER — Ambulatory Visit (INDEPENDENT_AMBULATORY_CARE_PROVIDER_SITE_OTHER): Payer: PPO | Admitting: *Deleted

## 2020-02-08 ENCOUNTER — Other Ambulatory Visit: Payer: Self-pay

## 2020-02-08 ENCOUNTER — Ambulatory Visit (HOSPITAL_COMMUNITY)
Admission: RE | Admit: 2020-02-08 | Discharge: 2020-02-08 | Disposition: A | Discharge status: Home / Self Care | Payer: PPO | Source: Ambulatory Visit | Attending: Internal Medicine | Admitting: Internal Medicine

## 2020-02-08 DIAGNOSIS — I480 Paroxysmal atrial fibrillation: Secondary | ICD-10-CM

## 2020-02-08 DIAGNOSIS — I4891 Unspecified atrial fibrillation: Secondary | ICD-10-CM | POA: Insufficient documentation

## 2020-02-08 MED ORDER — IOHEXOL 350 MG/ML SOLN
80.0000 mL | Freq: Once | INTRAVENOUS | Status: AC | PRN
  Administered 2020-02-08: 08:00:00 80 mL via INTRAVENOUS

## 2020-02-09 ENCOUNTER — Telehealth: Payer: Self-pay | Admitting: Internal Medicine

## 2020-02-09 NOTE — Telephone Encounter (Signed)
Returned call to Pt's wife.  Reiterated that Pt should NOT PUT anything in his mouth the morning of his procedure.  NOTHING in his mouth after midnight the night before his procedure.    Wife indicates understanding.

## 2020-02-09 NOTE — Progress Notes (Signed)
Carelink Summary Report / Loop Recorder 

## 2020-02-09 NOTE — Telephone Encounter (Signed)
Pt c/o medication issue:  1. Name of Medication: ELIQUIS 5 MG TABS tablet  2. How are you currently taking this medication (dosage and times per day)? As directed  3. Are you having a reaction (difficulty breathing--STAT)? no  4. What is your medication issue? Wife wants to make sure that the patient does not take the medication the morning of his ablation.

## 2020-02-10 ENCOUNTER — Other Ambulatory Visit (HOSPITAL_COMMUNITY)
Admission: RE | Admit: 2020-02-10 | Discharge: 2020-02-10 | Disposition: A | Discharge status: Home / Self Care | Payer: PPO | Source: Ambulatory Visit | Attending: Internal Medicine | Admitting: Internal Medicine

## 2020-02-10 DIAGNOSIS — Z01812 Encounter for preprocedural laboratory examination: Secondary | ICD-10-CM | POA: Diagnosis not present

## 2020-02-10 DIAGNOSIS — Z20822 Contact with and (suspected) exposure to covid-19: Secondary | ICD-10-CM | POA: Insufficient documentation

## 2020-02-10 LAB — SARS CORONAVIRUS 2 (TAT 6-24 HRS): SARS Coronavirus 2: NEGATIVE

## 2020-02-11 ENCOUNTER — Other Ambulatory Visit: Payer: Self-pay | Admitting: Internal Medicine

## 2020-02-11 ENCOUNTER — Ambulatory Visit (HOSPITAL_COMMUNITY)
Admission: RE | Admit: 2020-02-11 | Discharge: 2020-02-11 | Disposition: A | Discharge status: Home / Self Care | Payer: PPO | Source: Ambulatory Visit | Attending: Internal Medicine | Admitting: Internal Medicine

## 2020-02-11 DIAGNOSIS — I25119 Atherosclerotic heart disease of native coronary artery with unspecified angina pectoris: Secondary | ICD-10-CM | POA: Diagnosis not present

## 2020-02-11 DIAGNOSIS — I4891 Unspecified atrial fibrillation: Secondary | ICD-10-CM | POA: Diagnosis not present

## 2020-02-11 DIAGNOSIS — I739 Peripheral vascular disease, unspecified: Secondary | ICD-10-CM

## 2020-02-12 ENCOUNTER — Ambulatory Visit (HOSPITAL_COMMUNITY)
Admission: RE | Admit: 2020-02-12 | Discharge: 2020-02-12 | Disposition: A | Discharge status: Home / Self Care | Payer: PPO | Attending: Internal Medicine | Admitting: Internal Medicine

## 2020-02-12 ENCOUNTER — Other Ambulatory Visit: Payer: Self-pay | Admitting: Nurse Practitioner

## 2020-02-12 ENCOUNTER — Other Ambulatory Visit: Payer: Self-pay

## 2020-02-12 ENCOUNTER — Ambulatory Visit (HOSPITAL_COMMUNITY): Payer: PPO | Admitting: Certified Registered Nurse Anesthetist

## 2020-02-12 ENCOUNTER — Encounter (HOSPITAL_COMMUNITY)
Admission: RE | Disposition: A | Discharge status: Home / Self Care | Payer: Self-pay | Source: Home / Self Care | Attending: Internal Medicine

## 2020-02-12 ENCOUNTER — Encounter (HOSPITAL_COMMUNITY): Payer: Self-pay | Admitting: Internal Medicine

## 2020-02-12 DIAGNOSIS — Z880 Allergy status to penicillin: Secondary | ICD-10-CM | POA: Insufficient documentation

## 2020-02-12 DIAGNOSIS — N189 Chronic kidney disease, unspecified: Secondary | ICD-10-CM | POA: Diagnosis not present

## 2020-02-12 DIAGNOSIS — I4819 Other persistent atrial fibrillation: Secondary | ICD-10-CM | POA: Diagnosis not present

## 2020-02-12 DIAGNOSIS — E785 Hyperlipidemia, unspecified: Secondary | ICD-10-CM | POA: Diagnosis not present

## 2020-02-12 DIAGNOSIS — N400 Enlarged prostate without lower urinary tract symptoms: Secondary | ICD-10-CM | POA: Insufficient documentation

## 2020-02-12 DIAGNOSIS — Z7901 Long term (current) use of anticoagulants: Secondary | ICD-10-CM | POA: Insufficient documentation

## 2020-02-12 DIAGNOSIS — I6523 Occlusion and stenosis of bilateral carotid arteries: Secondary | ICD-10-CM | POA: Diagnosis not present

## 2020-02-12 DIAGNOSIS — Z888 Allergy status to other drugs, medicaments and biological substances status: Secondary | ICD-10-CM | POA: Insufficient documentation

## 2020-02-12 DIAGNOSIS — Z8673 Personal history of transient ischemic attack (TIA), and cerebral infarction without residual deficits: Secondary | ICD-10-CM | POA: Insufficient documentation

## 2020-02-12 DIAGNOSIS — I130 Hypertensive heart and chronic kidney disease with heart failure and stage 1 through stage 4 chronic kidney disease, or unspecified chronic kidney disease: Secondary | ICD-10-CM | POA: Diagnosis not present

## 2020-02-12 DIAGNOSIS — Z902 Acquired absence of lung [part of]: Secondary | ICD-10-CM | POA: Insufficient documentation

## 2020-02-12 DIAGNOSIS — Z882 Allergy status to sulfonamides status: Secondary | ICD-10-CM | POA: Diagnosis not present

## 2020-02-12 DIAGNOSIS — J449 Chronic obstructive pulmonary disease, unspecified: Secondary | ICD-10-CM | POA: Insufficient documentation

## 2020-02-12 DIAGNOSIS — K219 Gastro-esophageal reflux disease without esophagitis: Secondary | ICD-10-CM | POA: Diagnosis not present

## 2020-02-12 DIAGNOSIS — I5032 Chronic diastolic (congestive) heart failure: Secondary | ICD-10-CM | POA: Insufficient documentation

## 2020-02-12 DIAGNOSIS — Z79899 Other long term (current) drug therapy: Secondary | ICD-10-CM | POA: Insufficient documentation

## 2020-02-12 DIAGNOSIS — Z87891 Personal history of nicotine dependence: Secondary | ICD-10-CM | POA: Diagnosis not present

## 2020-02-12 DIAGNOSIS — Z7951 Long term (current) use of inhaled steroids: Secondary | ICD-10-CM | POA: Diagnosis not present

## 2020-02-12 DIAGNOSIS — I4891 Unspecified atrial fibrillation: Secondary | ICD-10-CM | POA: Diagnosis not present

## 2020-02-12 DIAGNOSIS — I739 Peripheral vascular disease, unspecified: Secondary | ICD-10-CM | POA: Insufficient documentation

## 2020-02-12 DIAGNOSIS — H409 Unspecified glaucoma: Secondary | ICD-10-CM | POA: Insufficient documentation

## 2020-02-12 DIAGNOSIS — G4733 Obstructive sleep apnea (adult) (pediatric): Secondary | ICD-10-CM | POA: Insufficient documentation

## 2020-02-12 DIAGNOSIS — M1990 Unspecified osteoarthritis, unspecified site: Secondary | ICD-10-CM | POA: Insufficient documentation

## 2020-02-12 DIAGNOSIS — I10 Essential (primary) hypertension: Secondary | ICD-10-CM | POA: Diagnosis not present

## 2020-02-12 HISTORY — PX: ATRIAL FIBRILLATION ABLATION: EP1191

## 2020-02-12 LAB — POCT ACTIVATED CLOTTING TIME: Activated Clotting Time: 268 seconds

## 2020-02-12 SURGERY — ATRIAL FIBRILLATION ABLATION
Anesthesia: General

## 2020-02-12 MED ORDER — ONDANSETRON HCL 4 MG/2ML IJ SOLN
INTRAMUSCULAR | Status: DC | PRN
  Administered 2020-02-12: 4 mg via INTRAVENOUS

## 2020-02-12 MED ORDER — LIDOCAINE 2% (20 MG/ML) 5 ML SYRINGE
INTRAMUSCULAR | Status: DC | PRN
  Administered 2020-02-12: 40 mg via INTRAVENOUS

## 2020-02-12 MED ORDER — SUGAMMADEX SODIUM 200 MG/2ML IV SOLN
INTRAVENOUS | Status: DC | PRN
  Administered 2020-02-12: 200 mg via INTRAVENOUS

## 2020-02-12 MED ORDER — ISOPROTERENOL HCL 0.2 MG/ML IJ SOLN
INTRAMUSCULAR | Status: AC
  Filled 2020-02-12: qty 5

## 2020-02-12 MED ORDER — HEPARIN SODIUM (PORCINE) 1000 UNIT/ML IJ SOLN
INTRAMUSCULAR | Status: DC | PRN
  Administered 2020-02-12: 3000 [IU] via INTRAVENOUS

## 2020-02-12 MED ORDER — PROTAMINE SULFATE 10 MG/ML IV SOLN
INTRAVENOUS | Status: DC | PRN
Start: 2020-02-12 — End: 2020-02-12
  Administered 2020-02-12: 10 mg via INTRAVENOUS
  Administered 2020-02-12: 30 mg via INTRAVENOUS

## 2020-02-12 MED ORDER — ADENOSINE 6 MG/2ML IV SOLN
INTRAVENOUS | Status: DC | PRN
  Administered 2020-02-12 (×2): 12 mg via INTRAVENOUS

## 2020-02-12 MED ORDER — ROCURONIUM BROMIDE 10 MG/ML (PF) SYRINGE
PREFILLED_SYRINGE | INTRAVENOUS | Status: DC | PRN
  Administered 2020-02-12: 60 mg via INTRAVENOUS

## 2020-02-12 MED ORDER — PROPOFOL 10 MG/ML IV BOLUS
INTRAVENOUS | Status: DC | PRN
  Administered 2020-02-12: 120 mg via INTRAVENOUS

## 2020-02-12 MED ORDER — HEPARIN SODIUM (PORCINE) 1000 UNIT/ML IJ SOLN
INTRAMUSCULAR | Status: AC
  Filled 2020-02-12: qty 2

## 2020-02-12 MED ORDER — PHENYLEPHRINE HCL-NACL 10-0.9 MG/250ML-% IV SOLN
INTRAVENOUS | Status: DC | PRN
  Administered 2020-02-12: 25 ug/min via INTRAVENOUS

## 2020-02-12 MED ORDER — HEPARIN (PORCINE) IN NACL 1000-0.9 UT/500ML-% IV SOLN
INTRAVENOUS | Status: DC | PRN
  Administered 2020-02-12: 500 mL

## 2020-02-12 MED ORDER — ADENOSINE 6 MG/2ML IV SOLN
INTRAVENOUS | Status: AC
  Filled 2020-02-12: qty 6

## 2020-02-12 MED ORDER — PHENYLEPHRINE 40 MCG/ML (10ML) SYRINGE FOR IV PUSH (FOR BLOOD PRESSURE SUPPORT)
PREFILLED_SYRINGE | INTRAVENOUS | Status: DC | PRN
  Administered 2020-02-12 (×3): 80 ug via INTRAVENOUS

## 2020-02-12 MED ORDER — ADENOSINE 6 MG/2ML IV SOLN
INTRAVENOUS | Status: AC
  Filled 2020-02-12: qty 2

## 2020-02-12 MED ORDER — SODIUM CHLORIDE 0.9 % IV SOLN: 250.0000 mL | INTRAVENOUS | Status: DC | PRN

## 2020-02-12 MED ORDER — ONDANSETRON HCL 4 MG/2ML IJ SOLN: 4.0000 mg | Freq: Four times a day (QID) | INTRAMUSCULAR | Status: DC | PRN

## 2020-02-12 MED ORDER — SODIUM CHLORIDE 0.9 % IV SOLN: INTRAVENOUS | Status: DC

## 2020-02-12 MED ORDER — SODIUM CHLORIDE 0.9% FLUSH: 3.0000 mL | INTRAVENOUS | Status: DC | PRN

## 2020-02-12 MED ORDER — ACETAMINOPHEN 325 MG PO TABS: 650.0000 mg | ORAL_TABLET | ORAL | Status: DC | PRN

## 2020-02-12 MED ORDER — HEPARIN SODIUM (PORCINE) 1000 UNIT/ML IJ SOLN
INTRAMUSCULAR | Status: DC | PRN
  Administered 2020-02-12: 1000 [IU] via INTRAVENOUS
  Administered 2020-02-12: 14000 [IU] via INTRAVENOUS

## 2020-02-12 MED ORDER — HEPARIN SODIUM (PORCINE) 1000 UNIT/ML IJ SOLN
INTRAMUSCULAR | Status: AC
  Filled 2020-02-12: qty 1

## 2020-02-12 MED ORDER — HYDROCODONE-ACETAMINOPHEN 5-325 MG PO TABS: 1.0000 | ORAL_TABLET | ORAL | Status: DC | PRN

## 2020-02-12 MED ORDER — ISOPROTERENOL HCL 0.2 MG/ML IJ SOLN
INTRAVENOUS | Status: DC | PRN
  Administered 2020-02-12: 10 ug/min via INTRAVENOUS

## 2020-02-12 MED ORDER — SODIUM CHLORIDE 0.9% FLUSH: 3.0000 mL | Freq: Two times a day (BID) | INTRAVENOUS | Status: DC

## 2020-02-12 MED ORDER — FENTANYL CITRATE (PF) 250 MCG/5ML IJ SOLN
INTRAMUSCULAR | Status: DC | PRN
  Administered 2020-02-12: 25 ug via INTRAVENOUS
  Administered 2020-02-12: 50 ug via INTRAVENOUS
  Administered 2020-02-12: 25 ug via INTRAVENOUS

## 2020-02-12 MED ORDER — DEXAMETHASONE SODIUM PHOSPHATE 10 MG/ML IJ SOLN
INTRAMUSCULAR | Status: DC | PRN
  Administered 2020-02-12: 5 mg via INTRAVENOUS

## 2020-02-12 SURGICAL SUPPLY — 21 items
BAG SNAP BAND KOVER 36X36 (MISCELLANEOUS) ×1 IMPLANT
BLANKET WARM UNDERBOD FULL ACC (MISCELLANEOUS) ×2 IMPLANT
CATH MAPPNG PENTARAY F 2-6-2MM (CATHETERS) IMPLANT
CATH SMTCH THERMOCOOL SF DF (CATHETERS) ×1 IMPLANT
CATH SOUNDSTAR ECO 8FR (CATHETERS) ×1 IMPLANT
CATH WEBSTER BI DIR CS D-F CRV (CATHETERS) ×1 IMPLANT
COVER SWIFTLINK CONNECTOR (BAG) ×2 IMPLANT
DEVICE CLOSURE PERCLS PRGLD 6F (VASCULAR PRODUCTS) IMPLANT
NDL BAYLIS TRANSSEPTAL 71CM (NEEDLE) IMPLANT
NEEDLE BAYLIS TRANSSEPTAL 71CM (NEEDLE) ×2 IMPLANT
PACK EP LATEX FREE (CUSTOM PROCEDURE TRAY) ×2
PACK EP LF (CUSTOM PROCEDURE TRAY) ×1 IMPLANT
PAD PRO RADIOLUCENT 2001M-C (PAD) ×2 IMPLANT
PATCH CARTO3 (PAD) ×1 IMPLANT
PENTARAY F 2-6-2MM (CATHETERS) ×2
PERCLOSE PROGLIDE 6F (VASCULAR PRODUCTS) ×6
SHEATH PINNACLE 7F 10CM (SHEATH) ×2 IMPLANT
SHEATH PINNACLE 9F 10CM (SHEATH) ×1 IMPLANT
SHEATH PROBE COVER 6X72 (BAG) ×1 IMPLANT
SHEATH SWARTZ TS SL2 63CM 8.5F (SHEATH) ×1 IMPLANT
TUBING SMART ABLATE COOLFLOW (TUBING) ×1 IMPLANT

## 2020-02-12 NOTE — Anesthesia Preprocedure Evaluation (Signed)
Anesthesia Evaluation  Patient identified by MRN, date of birth, ID band Patient awake    Reviewed: Allergy & Precautions, NPO status , Patient's Chart, lab work & pertinent test results  Airway Mallampati: II  TM Distance: >3 FB Neck ROM: Full    Dental  (+) Teeth Intact, Dental Advisory Given   Pulmonary former smoker,    breath sounds clear to auscultation       Cardiovascular hypertension,  Rhythm:Irregular     Neuro/Psych    GI/Hepatic   Endo/Other    Renal/GU      Musculoskeletal   Abdominal   Peds  Hematology   Anesthesia Other Findings   Reproductive/Obstetrics                             Anesthesia Physical Anesthesia Plan  ASA: III  Anesthesia Plan: General   Post-op Pain Management:    Induction: Intravenous  PONV Risk Score and Plan: Ondansetron and Dexamethasone  Airway Management Planned: Oral ETT  Additional Equipment:   Intra-op Plan:   Post-operative Plan: Extubation in OR  Informed Consent: I have reviewed the patients History and Physical, chart, labs and discussed the procedure including the risks, benefits and alternatives for the proposed anesthesia with the patient or authorized representative who has indicated his/her understanding and acceptance.     Dental advisory given  Plan Discussed with: CRNA and Anesthesiologist  Anesthesia Plan Comments:         Anesthesia Quick Evaluation

## 2020-02-12 NOTE — Progress Notes (Signed)
Patient and wife were given discharge instructions. Both verbalized understanding.

## 2020-02-12 NOTE — Anesthesia Procedure Notes (Signed)
Procedure Name: Intubation Date/Time: 02/12/2020 11:52 AM Performed by: Kathryne Hitch, CRNA Pre-anesthesia Checklist: Patient identified, Emergency Drugs available, Suction available and Patient being monitored Patient Re-evaluated:Patient Re-evaluated prior to induction Oxygen Delivery Method: Circle system utilized Preoxygenation: Pre-oxygenation with 100% oxygen Induction Type: IV induction Ventilation: Mask ventilation with difficulty Laryngoscope Size: Miller and 3 Grade View: Grade II Tube type: Oral Tube size: 7.5 mm Number of attempts: 1 Airway Equipment and Method: Patient positioned with wedge pillow and Stylet Placement Confirmation: ETT inserted through vocal cords under direct vision,  positive ETCO2 and breath sounds checked- equal and bilateral Secured at: 23 cm Tube secured with: Tape Dental Injury: Teeth and Oropharynx as per pre-operative assessment

## 2020-02-12 NOTE — Interval H&P Note (Signed)
History and Physical Interval Note:  02/12/2020 10:50 AM  Troy Kindred [name]  has presented today for surgery, with the diagnosis of Atrial Fibrillation.  The various methods of treatment have been discussed with the patient and family. After consideration of risks, benefits and other options for treatment, the patient has consented to  Procedure(s): ATRIAL FIBRILLATION ABLATION (N/A) as a surgical intervention.  The patient's history has been reviewed, patient examined, no change in status, stable for surgery.  I have reviewed the patient's chart and labs.  Questions were answered to the patient's satisfaction.    Cardiac CT reviewed at length with Dr Aundra Dubin and the patient.  Dr Aundra Dubin does not feel that he likely has significant CAD.  No ischemic symptoms at this time.  Cath 2018 reviewed with patient today who did not have obstructive CAD at that time.  We will plan lexiscan in 4 weeks as per Dr Oleh Genin advice. Dr Aundra Dubin advises that it is ok to proceed with ablation.  Thompson Grayer

## 2020-02-12 NOTE — Transfer of Care (Signed)
Immediate Anesthesia Transfer of Care Note  Patient: Troy Kindred [name]  Procedure(s) Performed: ATRIAL FIBRILLATION ABLATION (N/A )  Patient Location: Cath Lab  Anesthesia Type:General  Level of Consciousness: drowsy and patient cooperative  Airway & Oxygen Therapy: Patient Spontanous Breathing and Patient connected to face mask oxygen  Post-op Assessment: Report given to RN and Post -op Vital signs reviewed and stable  Post vital signs: Reviewed and stable  Last Vitals:  Vitals Value Taken Time  BP 94/39 02/12/20 1349  Temp 36.4 C 02/12/20 1343  Pulse 60 02/12/20 1350  Resp 10 02/12/20 1350  SpO2 99 % 02/12/20 1350  Vitals shown include unvalidated device data.  Last Pain:  Vitals:   02/12/20 1343  TempSrc: Temporal  PainSc: 0-No pain         Complications: No apparent anesthesia complications

## 2020-02-12 NOTE — Discharge Instructions (Signed)
No driving for 4 days. No lifting over 5 lbs for 1 week. No sexual activity for 1 week. You may return to work in 1 week. Keep procedure site clean & dry. If you notice increased pain, swelling, bleeding or pus, call/return!  You may shower, but no soaking baths/hot tubs/pools for 1 week.  ° ° °You have an appointment set up with the Atrial Fibrillation Clinic.  Multiple studies have shown that being followed by a dedicated atrial fibrillation clinic in addition to the standard care you receive from your other physicians improves health. We believe that enrollment in the atrial fibrillation clinic will allow us to better care for you.  ° °The phone number to the Atrial Fibrillation Clinic is [phone]. The clinic is staffed Monday through Friday from 8:30am to 5pm. ° °Parking Directions: The clinic is located in the Heart and Vascular Building connected to [place] [facility]. °1)From [address] turn on to [address] and go to the 3rd entrance  (Heart and Vascular entrance) on the right. °2)Look to the right for Heart &Vascular Parking Garage. °3)A code for the entrance is required please call the clinic to receive this.   °4)Take the elevators to the 1st floor. Registration is in the room with the glass walls at the end of the hallway. ° °If you have any trouble parking or locating the clinic, please don’t hesitate to call [phone]. ° ° °

## 2020-02-12 NOTE — Anesthesia Postprocedure Evaluation (Signed)
Anesthesia Post Note  Patient: Troy Nasworthy [name]  Procedure(s) Performed: ATRIAL FIBRILLATION ABLATION (N/A )     Patient location during evaluation: Spec[facility] Recovery Anesthesia Type: General Level of consciousness: awake and alert Pain management: pain level controlled Vital Signs Assessment: post-procedure vital signs reviewed and stable Respiratory status: spontaneous breathing, nonlabored ventilation, respiratory function stable and patient connected to nasal cannula oxygen Cardiovascular status: blood pressure returned to baseline and stable Postop Assessment: no apparent nausea or vomiting Anesthetic complications: no    Last Vitals:  Vitals:   02/12/20 1700 02/12/20 1705  BP: (!) 125/59 (!) 123/56  Pulse: 74 73  Resp: 12 13  Temp:    SpO2: 93% 92%    Last Pain:  Vitals:   02/12/20 1606  TempSrc:   PainSc: 0-No pain                 Shaelynn Dragos COKER

## 2020-02-15 ENCOUNTER — Other Ambulatory Visit: Payer: Self-pay | Admitting: Internal Medicine

## 2020-02-15 ENCOUNTER — Other Ambulatory Visit (HOSPITAL_COMMUNITY): Payer: Self-pay | Admitting: Cardiology

## 2020-02-15 ENCOUNTER — Other Ambulatory Visit: Payer: Self-pay | Admitting: Gastroenterology

## 2020-02-15 DIAGNOSIS — I509 Heart failure, unspecified: Secondary | ICD-10-CM

## 2020-02-15 DIAGNOSIS — R0602 Shortness of breath: Secondary | ICD-10-CM

## 2020-02-18 ENCOUNTER — Encounter: Payer: Self-pay | Admitting: Gastroenterology

## 2020-02-23 DIAGNOSIS — H401133 Primary open-angle glaucoma, bilateral, severe stage: Secondary | ICD-10-CM | POA: Diagnosis not present

## 2020-02-23 DIAGNOSIS — H353131 Nonexudative age-related macular degeneration, bilateral, early dry stage: Secondary | ICD-10-CM | POA: Diagnosis not present

## 2020-03-01 ENCOUNTER — Ambulatory Visit: Payer: PPO | Admitting: Psychiatry

## 2020-03-03 ENCOUNTER — Ambulatory Visit: Payer: PPO | Admitting: Psychiatry

## 2020-03-03 ENCOUNTER — Encounter: Payer: PPO | Admitting: Gastroenterology

## 2020-03-04 ENCOUNTER — Telehealth: Payer: Self-pay

## 2020-03-04 DIAGNOSIS — I25119 Atherosclerotic heart disease of native coronary artery with unspecified angina pectoris: Secondary | ICD-10-CM

## 2020-03-04 DIAGNOSIS — R9389 Abnormal findings on diagnostic imaging of other specified body structures: Secondary | ICD-10-CM

## 2020-03-04 NOTE — Telephone Encounter (Signed)
Order placed for lexiscan myoview after abnormal cardiac CT scan.

## 2020-03-04 NOTE — Telephone Encounter (Signed)
-----   Message from Patsey Berthold, NP sent at 02/12/2020 10:52 AM EDT ----- Marykay Lex, Mr Morton needs a myoview in 4 weeks to follow up on CT scan. I tried to order it but couldn't figure out how to get it done at [facility] Can you help?  Museum/gallery conservator

## 2020-03-14 ENCOUNTER — Ambulatory Visit (INDEPENDENT_AMBULATORY_CARE_PROVIDER_SITE_OTHER): Payer: PPO | Admitting: *Deleted

## 2020-03-14 DIAGNOSIS — I480 Paroxysmal atrial fibrillation: Secondary | ICD-10-CM | POA: Diagnosis not present

## 2020-03-14 LAB — CUP PACEART REMOTE DEVICE CHECK
Date Time Interrogation Session: 20210614001028
Implantable Pulse Generator Implant Date: 20200709

## 2020-03-16 NOTE — Progress Notes (Signed)
Carelink Summary Report / Loop Recorder 

## 2020-03-21 ENCOUNTER — Ambulatory Visit (HOSPITAL_COMMUNITY): Payer: PPO | Admitting: Physician Assistant

## 2020-03-22 ENCOUNTER — Other Ambulatory Visit: Payer: Self-pay

## 2020-03-22 ENCOUNTER — Ambulatory Visit (HOSPITAL_COMMUNITY)
Admission: RE | Admit: 2020-03-22 | Discharge: 2020-03-22 | Disposition: A | Discharge status: Home / Self Care | Payer: PPO | Source: Ambulatory Visit | Attending: Physician Assistant | Admitting: Physician Assistant

## 2020-03-22 ENCOUNTER — Encounter (HOSPITAL_COMMUNITY): Payer: Self-pay | Admitting: Physician Assistant

## 2020-03-22 VITALS — BP 140/62 | HR 57 | Ht 73.0 in | Wt 201.4 lb

## 2020-03-22 DIAGNOSIS — I130 Hypertensive heart and chronic kidney disease with heart failure and stage 1 through stage 4 chronic kidney disease, or unspecified chronic kidney disease: Secondary | ICD-10-CM | POA: Insufficient documentation

## 2020-03-22 DIAGNOSIS — Z8249 Family history of ischemic heart disease and other diseases of the circulatory system: Secondary | ICD-10-CM | POA: Diagnosis not present

## 2020-03-22 DIAGNOSIS — F419 Anxiety disorder, unspecified: Secondary | ICD-10-CM | POA: Diagnosis not present

## 2020-03-22 DIAGNOSIS — I480 Paroxysmal atrial fibrillation: Secondary | ICD-10-CM | POA: Diagnosis not present

## 2020-03-22 DIAGNOSIS — D6869 Other thrombophilia: Secondary | ICD-10-CM | POA: Insufficient documentation

## 2020-03-22 DIAGNOSIS — Z7901 Long term (current) use of anticoagulants: Secondary | ICD-10-CM | POA: Diagnosis not present

## 2020-03-22 DIAGNOSIS — F329 Major depressive disorder, single episode, unspecified: Secondary | ICD-10-CM | POA: Insufficient documentation

## 2020-03-22 DIAGNOSIS — E785 Hyperlipidemia, unspecified: Secondary | ICD-10-CM | POA: Diagnosis not present

## 2020-03-22 DIAGNOSIS — J90 Pleural effusion, not elsewhere classified: Secondary | ICD-10-CM | POA: Diagnosis not present

## 2020-03-22 DIAGNOSIS — N183 Chronic kidney disease, stage 3 unspecified: Secondary | ICD-10-CM | POA: Insufficient documentation

## 2020-03-22 DIAGNOSIS — Z79899 Other long term (current) drug therapy: Secondary | ICD-10-CM | POA: Diagnosis not present

## 2020-03-22 DIAGNOSIS — K219 Gastro-esophageal reflux disease without esophagitis: Secondary | ICD-10-CM | POA: Insufficient documentation

## 2020-03-22 DIAGNOSIS — Z87891 Personal history of nicotine dependence: Secondary | ICD-10-CM | POA: Diagnosis not present

## 2020-03-22 DIAGNOSIS — I5032 Chronic diastolic (congestive) heart failure: Secondary | ICD-10-CM | POA: Insufficient documentation

## 2020-03-22 DIAGNOSIS — J449 Chronic obstructive pulmonary disease, unspecified: Secondary | ICD-10-CM | POA: Insufficient documentation

## 2020-03-22 NOTE — Progress Notes (Signed)
Primary Care Physician: Binnie Rail, MD Referring Physician: Darrick Grinder, NP/[name] EP: Dr. Achilles Dunk [name] is a 79 y.o. male with a h/o HTN, COPD,  carotid stenosis s/p right CEA, paroxysmal atrial fibrillation, and PAD, obstructive CAD, returns for followup of CHF and atrial fibrillation.He had a left fem-pop bypass at the [facility] in the '90s. He is followed at [facility] for PAD. He has chronic right calf/thigh/buttocks claudication that is unchanged over the last few years and follows regularly at [facility]. He has had right lower lobectomy for lung cancer. He continues to stay off cigarettes. He had been on amiodarone until last October when he was stopped for hyperthyroidism and possible lung changes.  In 06/19/18, he was admitted with GI bleeding and found to have a sigmoid mass on colonoscopy, path showed sigmoid adenocarcinoma. He was noted to have right paratracheal lymphadenopathy but EUS with biopsy in 10/19 did not show malignancy. Eliquis was stopped with bleeding sigmoid mass. He was noted to be back in atrial fibrillation that admission.   He had a virtual appointment with Darrick Grinder, NP, 4/27 and Chanetta Marshall, NP, had relayed that his LinQ revealed 23% afib burden. Pt had noted some increased fatigue and elevated heart rates at home. He was started back on his eliquis 5 mg bid and set up for this visit today. His is on toprol 25 mg daily.  Follow up 05/07/19. Today he is in afib, rate controlled. He is back on eliquis since last Friday. No rectal bleeding noted. He is now about 6 weeks post rectal surgery. He is not aware of the afib today but the wife can tell when he is in afib because of extra fatigue. When I reviewed Dr. Jackalyn Lombard note from last June he offered the pt an ablation but he deferred at that time.His weight is stable.By last echo left atrium is normal size.By last endocrinologist note his last thyroid panel was normal but sine it was a virtual visit, she  states that it would be good to get thyroid labs when able.  Follow up in the AF clinic 03/22/20. Patient is s/p ablation with Dr Rayann Heman on 02/12/20. Patient reports that he has done very well since his ablation. ILR shows no new episodes since the procedure. He denies CP, swallowing, or groin issues. Of note, his wife recently passed away.   Today, he denies symptoms of palpitations, chest pain, shortness of breath, orthopnea, PND, lower extremity edema, dizziness, presyncope, syncope, or neurologic sequela. The patient is tolerating medications without difficulties and is otherwise without complaint today.   Past Medical History:  Diagnosis Date  . AAA (abdominal aortic aneurysm) ([place]) LAST ABDOMINAL US 10-20-17 3.3 CM   MONITORED BY DR [name]  . Anxiety   . Arthritis   . Atrial fibrillation ([place])   . Basal cell carcinoma   . Bilateral carotid artery stenosis DUPLEX 12-29-2012  BY DR [facility]   BILATERAL ICA STENOSIS 60-79%  . BPH (benign prostatic hypertrophy)   . Chronic diastolic heart failure ([place])   . Chronic kidney disease    stage 3, pt unaware  . Colon cancer ([place])   . Complication of anesthesia    had nausea after carotid artery surgery, states the medicine given to help the nausea, made it worse  . COPD (chronic obstructive pulmonary disease) ([place])   . Depression   . GERD (gastroesophageal reflux disease)   . Glaucoma BOTH EYES   Dr Gershon Crane  .  History of basal cell carcinoma excision   . History of lung cancer APRIL 2012  SQUAMOUS CELL---- S/P RIGHT LOWER LOBECTOMY AT [facility] --  NO CHEMORADIATION---  NO RECURRENCE    ONCOLOGIST- DR Tressie Stalker  LOV IN [facility] 10-27-2012  . History of pneumothorax    pt unaware  . Hx of adenomatous colonic polyps 2005    X 2; 1 hyperplastic polyp; Dr Olevia Perches  . Hyperlipidemia   . Hypertension   . Impaired fasting glucose 2007   108; A1c5.4%  . Lesion of bladder   . Lung cancer ([place]) 01/25/2012  . Microhematuria   . OSA (obstructive sleep  apnea) 08/29/2015   CPAP SET ON 10  . PAD (peripheral artery disease) ([place]) ABI'S  JAN 2014  0.65 ON RIGHT ;  1.04 ON LEFT  . Peripheral vascular disease ([place]) S/P ANGIOPLASTY AND STENTING   FOLLOWED  BY DR [name]  . Pleural effusion on right 11/18/2018   Chronic, noted on CXR  . Spinal stenosis Sept. 2015  . Status post placement of implantable loop recorder   . Stroke [facility]) Jul 07, 2013   mini  TIA  . Thoracic aorta atherosclerosis ([place])   . Thyrotoxicosis    amiodarone induced  . Urgency of urination    Past Surgical History:  Procedure Laterality Date  . ANGIO PLASTY     X 4 in legs  . AORTOGRAM  07-27-2002   MILD DIFFUSE ILIAC ARTERY OCCLUSIVE DISEASE /  LEFT RENAL ARTERY 20%/ PATENT LEFT FEM-POP GRAFT/ MILD SFA AND POPLITEAL ARTERY OCCLUSIVE DISEASE W/ SEVERE KIDNEY OCCLUSIVE DISEASE  . ATRIAL FIBRILLATION ABLATION N/A 04/09/2019   Procedure: ATRIAL FIBRILLATION ABLATION;  Surgeon: Thompson Grayer, MD;  Location: [place] CV LAB;  Service: Cardiovascular;  Laterality: N/A;  . ATRIAL FIBRILLATION ABLATION N/A 02/12/2020   Procedure: ATRIAL FIBRILLATION ABLATION;  Surgeon: Thompson Grayer, MD;  Location: [place] CV LAB;  Service: Cardiovascular;  Laterality: N/A;  . BASAL CELL CARCINOMA EXCISION     MULTIPLE TIMES--  RIGHT FOREARM, CHEEKS, AND BACK  . BIOPSY  06/25/2018   Procedure: BIOPSY;  Surgeon: Rush Landmark Telford Nab., MD;  Location: [place];  Service: Gastroenterology;;  . CARDIOVASCULAR STRESS TEST  12-08-2012  DR MCLEAN   NORMAL LEXISCAN WITH NO EXERCISE NUCLEAR STUDY/ EF 66%/   NO ISCHEMIA/ NO SIGNIFICANT CHANGE FROM PRIOR STUDY  . CARDIOVERSION N/A 02/14/2018   Procedure: CARDIOVERSION;  Surgeon: Larey Dresser, MD;  Location: [facility] ENDOSCOPY;  Service: Cardiovascular;  Laterality: N/A;  . CAROTID ANGIOGRAM N/A 07/10/2013   Procedure: CAROTID ANGIOGRAM;  Surgeon: Elam Dutch, MD;  Location: [facility] CATH LAB;  Service: Cardiovascular;  Laterality: N/A;  . CAROTID  ENDARTERECTOMY Right 07-14-13   cea  . CATARACT EXTRACTION W/ INTRAOCULAR LENS  IMPLANT, BILATERAL    . colon polyectomy    . COLONOSCOPY WITH PROPOFOL N/A 06/25/2018   Procedure: COLONOSCOPY WITH PROPOFOL;  Surgeon: Rush Landmark Telford Nab., MD;  Location: [place];  Service: Gastroenterology;  Laterality: N/A;  . CYSTOSCOPY W/ RETROGRADES Bilateral 01/21/2013   Procedure: CYSTOSCOPY WITH RETROGRADE PYELOGRAM;  Surgeon: Molli Hazard, MD;  Location: [facility];  Service: Urology;  Laterality: Bilateral;   CYSTO, BLADDER BIOPSY, BILATERAL RETROGRADE PYELOGRAM  RAD TECH FROM RADIOLOGY PER JOY  . CYSTOSCOPY WITH BIOPSY N/A 01/21/2013   Procedure: CYSTOSCOPY WITH BIOPSY;  Surgeon: Molli Hazard, MD;  Location: [facility];  Service: Urology;  Laterality: N/A;  . ENDARTERECTOMY Right 07/14/2013   Procedure:  ENDARTERECTOMY CAROTID;  Surgeon: Angelia Mould, MD;  Location: [place];  Service: Vascular;  Laterality: Right;  . EP IMPLANTABLE DEVICE N/A 08/12/2015   Procedure: Loop Recorder Insertion;  Surgeon: Thompson Grayer, MD;  Location: [place] CV LAB;  Service: Cardiovascular;  Laterality: N/A;  . ESOPHAGOGASTRODUODENOSCOPY (EGD) WITH PROPOFOL N/A 06/25/2018   Procedure: ESOPHAGOGASTRODUODENOSCOPY (EGD) WITH PROPOFOL;  Surgeon: Rush Landmark Telford Nab., MD;  Location: [place];  Service: Gastroenterology;  Laterality: N/A;  . ESOPHAGOGASTRODUODENOSCOPY (EGD) WITH PROPOFOL N/A 07/10/2018   Procedure: ESOPHAGOGASTRODUODENOSCOPY (EGD) WITH PROPOFOL;  Surgeon: Milus Banister, MD;  Location: [facility] ENDOSCOPY;  Service: Endoscopy;  Laterality: N/A;  . EUS N/A 07/10/2018   Procedure: UPPER ENDOSCOPIC ULTRASOUND (EUS) RADIAL;  Surgeon: Milus Banister, MD;  Location: [facility] ENDOSCOPY;  Service: Endoscopy;  Laterality: N/A;  . EYE SURGERY Right   . FEMORAL-POPLITEAL BYPASS GRAFT Left [facility]   AND 2001  [place]  . FEMORAL-POPLITEAL BYPASS  GRAFT Left 08/30/2015   Procedure: REVISION OF BYPASS GRAFT Left  FEMORAL-POPLITEAL ARTERY;  Surgeon: Angelia Mould, MD;  Location: [place];  Service: Vascular;  Laterality: Left;  . FINE NEEDLE ASPIRATION N/A 07/10/2018   Procedure: FINE NEEDLE ASPIRATION (FNA) LINEAR;  Surgeon: Milus Banister, MD;  Location: [facility] ENDOSCOPY;  Service: Endoscopy;  Laterality: N/A;  . FLEXIBLE SIGMOIDOSCOPY N/A 12/12/2018   Procedure: FLEXIBLE SIGMOIDOSCOPY;  Surgeon: Ileana Roup, MD;  Location: [facility] ORS;  Service: General;  Laterality: N/A;  . LARYNGOSCOPY  06-27-2004   BX VOCAL CORD  (LEUKOPLAKIA)  PER PT NO ISSUES SINCE  . LEFT HEART CATH AND CORONARY ANGIOGRAPHY N/A 08/20/2017   Procedure: LEFT HEART CATH AND CORONARY ANGIOGRAPHY;  Surgeon: Larey Dresser, MD;  Location: [place] CV LAB;  Service: Cardiovascular;  Laterality: N/A;  . LOOP RECORDER INSERTION N/A 04/09/2019   Procedure: LOOP RECORDER INSERTION;  Surgeon: Thompson Grayer, MD;  Location: [place] CV LAB;  Service: Cardiovascular;  Laterality: N/A;  . LOOP RECORDER REMOVAL N/A 04/09/2019   Procedure: LOOP RECORDER REMOVAL;  Surgeon: Thompson Grayer, MD;  Location: [place] CV LAB;  Service: Cardiovascular;  Laterality: N/A;  . LOWER EXTREMITY ANGIOGRAM Bilateral 08/29/2015   Procedure: Lower Extremity Angiogram;  Surgeon: Angelia Mould, MD;  Location: [place] CV LAB;  Service: Cardiovascular;  Laterality: Bilateral;  . LUNG LOBECTOMY  01/24/2011    RIGHT UPPER LOBE  (SQUAMOUS CELL CARCINOMA) Dr Dorthea Cove , [facility]. No chemotherapyor radiation  . PATCH ANGIOPLASTY Right 07/14/2013   Procedure: PATCH ANGIOPLASTY;  Surgeon: Angelia Mould, MD;  Location: [place];  Service: Vascular;  Laterality: Right;  . PATCH ANGIOPLASTY Left 08/30/2015   Procedure: VEIN PATCH ANGIOPLASTY OF PROXIMAL Left BYPASS GRAFT;  Surgeon: Angelia Mould, MD;  Location: [place];  Service: Vascular;  Laterality: Left;  . PERIPHERAL VASCULAR  CATHETERIZATION N/A 08/29/2015   Procedure: Abdominal Aortogram;  Surgeon: Angelia Mould, MD;  Location: [place] CV LAB;  Service: Cardiovascular;  Laterality: N/A;  . POLYPECTOMY  06/25/2018   Procedure: POLYPECTOMY;  Surgeon: Rush Landmark Telford Nab., MD;  Location: [place];  Service: Gastroenterology;;  . Lia Foyer INJECTION  06/25/2018   Procedure: SUBMUCOSAL INJECTION;  Surgeon: Irving Copas., MD;  Location: [place];  Service: Gastroenterology;;  . TEE WITHOUT CARDIOVERSION N/A 02/14/2018   Procedure: TRANSESOPHAGEAL ECHOCARDIOGRAM (TEE);  Surgeon: Larey Dresser, MD;  Location: [facility] ENDOSCOPY;  Service: Cardiovascular;  Laterality: N/A;  . trabecular surgery     OS  .  TRANSTHORACIC ECHOCARDIOGRAM  12-29-2012  DR [facility]   MILD LVH/  LVSF NORMAL/ EF 02-77%/  GRADE I DIASTOLIC DYSFUNCTION    Current Outpatient Medications  Medication Sig Dispense Refill  . acetaminophen (TYLENOL) 325 MG tablet Take 325 mg by mouth every 6 (six) hours as needed for moderate pain or headache.     [name] amLODipine (NORVASC) 5 MG tablet Take 1 tablet (5 mg total) by mouth daily. 30 tablet 6  . apixaban (ELIQUIS) 5 MG TABS tablet Take 1 tablet (5 mg total) by mouth 2 (two) times daily. 60 tablet 6  . Ascorbic Acid (VITAMIN C PO) Take 500 mg by mouth in the morning and at bedtime.     . AZOPT 1 % ophthalmic suspension Place 1 drop into both eyes in the morning and at bedtime.     . bimatoprost (LUMIGAN) 0.01 % SOLN Place 1 drop at bedtime into both eyes.     . Biotin 2500 MCG CAPS Take 2,500 mcg by mouth daily.    [name] docusate sodium (COLACE) 100 MG capsule Take 100 mg by mouth 2 (two) times daily.     . famotidine (PEPCID) 20 MG tablet Take 20 mg by mouth 2 (two) times daily.    . ferrous sulfate 325 (65 FE) MG tablet Take 325 mg by mouth 2 (two) times daily with a meal.     . FLUoxetine (PROZAC) 40 MG capsule Take 1 capsule (40 mg total) by mouth daily. 90 capsule 3  . furosemide  (LASIX) 20 MG tablet Take 2 tablets (40 mg total) by mouth daily. 60 tablet 6  . losartan (COZAAR) 25 MG tablet Take 2 tablets (50 mg total) by mouth daily. 60 tablet 5  . Magnesium 500 MG CAPS Take 500 mg by mouth daily.     . Melatonin 5 MG TABS Take 5 mg by mouth at bedtime.    . metoprolol succinate (TOPROL-XL) 25 MG 24 hr tablet Take 0.5 tablets (12.5 mg total) by mouth at bedtime. 15 tablet 6  . montelukast (SINGULAIR) 10 MG tablet TAKE ONE TABLET AT BEDTIME. 30 tablet 1  . Multiple Vitamins-Minerals (CENTRUM SILVER PO) Take 1 tablet by mouth daily.    . Multiple Vitamins-Minerals (ZINC PO) Take 1 tablet by mouth daily.    [name] omeprazole (PRILOSEC) 20 MG capsule Take 1 capsule (20 mg total) by mouth 2 (two) times daily before a meal. Take 30- 60 min before your first and last meals of the day 180 capsule 1  . Polyethyl Glycol-Propyl Glycol (SYSTANE) 0.4-0.3 % SOLN Place 1 drop into both eyes daily as needed (for dry eyes).     . potassium chloride SA (KLOR-CON) 20 MEQ tablet Take 1 tablet (20 mEq total) by mouth daily. 30 tablet 6  . rosuvastatin (CRESTOR) 40 MG tablet Take 1 tablet (40 mg total) by mouth daily. 30 tablet 6  . senna (SENOKOT) 8.6 MG tablet Take 1 tablet by mouth 2 (two) times daily.    . SYMBICORT 160-4.5 MCG/ACT inhaler USE 2 PUFFS TWICE DAILY. (Patient taking differently: Inhale 2 puffs into the lungs in the morning and at bedtime. ) 10.2 g 5   No current facility-administered medications for this encounter.    Allergies  Allergen Reactions  . Niacin-Lovastatin Er Shortness Of Breath and Other (See Comments)    Dyspnea, flushing  . Penicillins Hives, Shortness Of Breath and Other (See Comments)    Flushing  & dyspnea Because of a history of documented adverse serious  drug reaction;Medi Alert bracelet  is recommended PCN reaction causing immediate rash, facial/tongue/throat swelling, SOB or lightheadedness with hypotension: Yes PCN reaction causing severe rash  involving mucus membranes or skin necrosis: No PCN reaction occurring within the last 10 years: NO PCN reaction that required hospitalization: NO  . Sulfonamide Derivatives Hives, Shortness Of Breath and Other (See Comments)    Flushing & dyspnea Because of a history of documented adverse serious drug reaction;Medi Alert bracelet  is recommended  . Atorvastatin Other (See Comments)    Myalgias & athralgias- takes Crestor, DOES NOT LIKE LIPITOR    Social History   Socioeconomic History  . Marital status: Married    Spouse name: [name]   . Number of children: 3  . Years of education: 12+  . Highest education level: Not on file  Occupational History  . 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 Smoking status: Former Smoker    Packs/day: 0.50    Years: 50.00    Pack years: 25.00    Types: Cigarettes    Quit date: 07/28/2014    Years since quitting: 5.6  . Smokeless tobacco: Never Used  Vaping Use  . Vaping Use: Never used  Substance and Sexual Activity  . Alcohol use: Yes    Alcohol/week: 4.0 standard drinks    Types: 1 Glasses of wine, 1 Cans of beer, 1 Shots of liquor, 1 Standard drinks or equivalent per week    Comment:  socially, variable  . Drug use: No  . Sexual activity: Not on file  Other Topics Concern  . Not on file  Social History Narrative   Patient lives at home with spouse [name]   Patient has 3 children    Patient is right handed    Social Determinants of Health   Financial Resource Strain:   . Difficulty of Paying Living Expenses:   Food Insecurity:   . Worried About Charity fundraiser in the Last Year:   . Arboriculturist in the Last Year:   Transportation Needs:   . Film/video editor (Medical):   [name] Lack of Transportation (Non-Medical):   Physical Activity:   . Days of Exercise per Week:   . Minutes of Exercise per  Session:   Stress:   . Feeling of Stress :   Social Connections:   . Frequency of Communication with Friends and Family:   . Frequency of Social Gatherings with Friends and Family:   . Attends Religious Services:   . Active Member of Clubs or Organizations:   . Attends Archivist Meetings:   [name] Marital Status:   Intimate Partner Violence:   . Fear of Current or Ex-Partner:   . Emotionally Abused:   [name] Physically Abused:   . Sexually Abused:     Family History  Problem Relation Age of Onset  . Stroke Mother        mini strokes  . Alcohol abuse Father   . Heart disease Father        MI after 58  . Stroke Father   . Hypertension Father   . Heart attack Father   . Heart disease Paternal Aunt        several  . Hypertension Paternal Aunt        several  . Stroke Paternal Aunt  several  . Stroke Paternal Uncle        several  . Heart disease Paternal Uncle        several;2 had MI pre 10  . Cancer Daughter 36       breast ca, also with benign sessile polyp     ROS- All systems are reviewed and negative except as per the HPI above  Physical Exam: Vitals:   03/22/20 1506  BP: 140/62  Pulse: (!) 57  Weight: 91.4 kg  Height: 6\' 1"  (1.854 m)   Wt Readings from Last 3 Encounters:  03/22/20 91.4 kg  02/12/20 88.5 kg  01/20/20 86.2 kg    Labs: Lab Results  Component Value Date   NA 139 01/14/2020   K 4.8 01/14/2020   CL 107 01/14/2020   CO2 24 01/14/2020   GLUCOSE 116 (H) 01/14/2020   BUN 16 01/14/2020   CREATININE 1.28 (H) 01/14/2020   CALCIUM 8.9 01/14/2020   PHOS 3.3 12/16/2018   MG 2.0 12/16/2018   Lab Results  Component Value Date   INR 1.0 12/03/2018   Lab Results  Component Value Date   CHOL 123 01/06/2020   HDL 36 (L) 01/06/2020   LDLCALC 59 01/06/2020   TRIG 138 01/06/2020    GEN- The patient is well appearing elderly male, alert and oriented x 3 today.   HEENT-head normocephalic, atraumatic, sclera clear, conjunctiva pink,  hearing intact, trachea midline. Lungs- Clear to ausculation bilaterally, normal work of breathing Heart- Regular rate and rhythm, no murmurs, rubs or gallops  GI- soft, NT, ND, + BS Extremities- no clubbing, cyanosis, or edema MS- no significant deformity or atrophy Skin- no rash or lesion Psych- euthymic mood, full affect Neuro- strength and sensation are intact   EKG- SB HR 57, PR 172, QRS 74, QTc 422  Device notes reviewed Epic records reviewed  Echo 01/13/20 1. Left ventricular ejection fraction, by estimation, is 60 to 65%. The  left ventricle has normal function. Left ventricular endocardial border  not optimally defined to evaluate regional wall motion. Left ventricular  diastolic parameters are  indeterminate. Elevated left ventricular end-diastolic pressure.  2. Right ventricular systolic function is normal. The right ventricular  size is normal. There is normal pulmonary artery systolic pressure.  3. The mitral valve is normal in structure. Trivial mitral valve  regurgitation. No evidence of mitral stenosis.  4. The aortic valve is normal in structure. Aortic valve regurgitation is  not visualized. No aortic stenosis is present.  5. The inferior vena cava is normal in size with greater than 50%  respiratory variability, suggesting right atrial pressure of 3 mmHg.  6. Overall poor quality echo with poor acoustical windows. Best images  that were in the subcostal views but overall LVF appears preserved. Cannot  assess Regional wall motion with accuracy. LV filling pressures were  elevated.   CHA2DS2-VASc Score = 6  The patient's score is based upon: CHF History: 0 HTN History: 1 Age : 2 Diabetes History: 0 Stroke History: 2 Vascular Disease History: 1 Gender: 0      ASSESSMENT AND PLAN: 1. Paroxysmal Atrial Fibrillation (ICD10:  I48.0) The patient's CHA2DS2-VASc score is 6, indicating a 9.7% annual risk of stroke.  Afib burden on ILR down to 2.5% from  12.9%. No new episodes since ablation.  S/p repeat afib ablation with Dr Rayann Heman on 02/12/20 Continue Toprol 12.5 mg daily Continue Eliquis 5 mg BID Recall pt has h/o amio induced thyrotoxicosis.   2. Secondary  Hypercoagulable State (ICD10:  D68.69) The patient is at significant risk for stroke/thromboembolism based upon his CHA2DS2-VASc Score of 6.  Continue Apixaban (Eliquis).   3. Chronic diastolic dysfunction No signs or symptoms of fluid overload today.   Follow up with Dr Aundra Dubin and Dr Rayann Heman as scheduled.   [facility] [address] [place], [place] [address] [phone]

## 2020-03-24 ENCOUNTER — Telehealth (HOSPITAL_COMMUNITY): Payer: Self-pay

## 2020-03-24 ENCOUNTER — Encounter (HOSPITAL_COMMUNITY): Payer: Self-pay

## 2020-03-24 NOTE — Telephone Encounter (Signed)
Spoke with the patient he stated that he understood and would be here for his test. Asked to call back with any questions. S.Skylan Lara EMTP

## 2020-03-28 ENCOUNTER — Telehealth: Payer: Self-pay | Admitting: Gastroenterology

## 2020-03-28 NOTE — Telephone Encounter (Signed)
Hey Dr Rush Landmark, pt called to rsx his endo colon that he cancelled back in April. Pt is on Eliquis, is it ok to reschedule him or do you want to see him in the office first.

## 2020-03-28 NOTE — Telephone Encounter (Signed)
Ok to proceed with EGD/Colonoscopy scheduling. Please take care of anticoagulation hold as per protocol with Cardiology due to history of Afib. Thanks. GM

## 2020-03-29 ENCOUNTER — Other Ambulatory Visit: Payer: Self-pay

## 2020-03-29 ENCOUNTER — Ambulatory Visit (HOSPITAL_COMMUNITY): Payer: PPO | Attending: Cardiovascular Disease

## 2020-03-29 DIAGNOSIS — I25119 Atherosclerotic heart disease of native coronary artery with unspecified angina pectoris: Secondary | ICD-10-CM | POA: Insufficient documentation

## 2020-03-29 DIAGNOSIS — R9389 Abnormal findings on diagnostic imaging of other specified body structures: Secondary | ICD-10-CM | POA: Diagnosis not present

## 2020-03-29 LAB — MYOCARDIAL PERFUSION IMAGING
LV dias vol: 86 mL (ref 62–150)
LV sys vol: 25 mL
Peak HR: 68 {beats}/min
Rest HR: 52 {beats}/min
SDS: 0
SRS: 0
SSS: 0
TID: 0.89

## 2020-03-29 MED ORDER — TECHNETIUM TC 99M TETROFOSMIN IV KIT
10.7000 | PACK | Freq: Once | INTRAVENOUS | Status: AC | PRN
  Administered 2020-03-29: 10.7 via INTRAVENOUS
  Filled 2020-03-29: qty 11

## 2020-03-29 MED ORDER — TECHNETIUM TC 99M TETROFOSMIN IV KIT
30.6000 | PACK | Freq: Once | INTRAVENOUS | Status: AC | PRN
  Administered 2020-03-29: 30.6 via INTRAVENOUS
  Filled 2020-03-29: qty 31

## 2020-03-29 MED ORDER — REGADENOSON 0.4 MG/5ML IV SOLN
0.4000 mg | Freq: Once | INTRAVENOUS | Status: AC
  Administered 2020-03-29: 0.4 mg via INTRAVENOUS

## 2020-03-29 NOTE — Telephone Encounter (Signed)
Spoke w. Pt's daughter and relayed message. She is aware that Sep schedule is not yet available and that we will contact pt to r/s when it becomes avail.

## 2020-04-11 ENCOUNTER — Other Ambulatory Visit: Payer: Self-pay | Admitting: Internal Medicine

## 2020-04-13 NOTE — Telephone Encounter (Signed)
Left message to call back  

## 2020-04-14 ENCOUNTER — Other Ambulatory Visit: Payer: Self-pay | Admitting: Oncology

## 2020-04-14 ENCOUNTER — Other Ambulatory Visit: Payer: Self-pay

## 2020-04-14 ENCOUNTER — Inpatient Hospital Stay: Payer: PPO | Attending: Oncology

## 2020-04-14 DIAGNOSIS — C187 Malignant neoplasm of sigmoid colon: Secondary | ICD-10-CM | POA: Diagnosis not present

## 2020-04-14 LAB — CBC WITH DIFFERENTIAL (CANCER CENTER ONLY)
Abs Immature Granulocytes: 0.02 10*3/uL (ref 0.00–0.07)
Basophils Absolute: 0 10*3/uL (ref 0.0–0.1)
Basophils Relative: 1 %
Eosinophils Absolute: 0.1 10*3/uL (ref 0.0–0.5)
Eosinophils Relative: 2 %
HCT: 35.4 % — ABNORMAL LOW (ref 39.0–52.0)
Hemoglobin: 11.5 g/dL — ABNORMAL LOW (ref 13.0–17.0)
Immature Granulocytes: 0 %
Lymphocytes Relative: 11 %
Lymphs Abs: 0.6 10*3/uL — ABNORMAL LOW (ref 0.7–4.0)
MCH: 30.6 pg (ref 26.0–34.0)
MCHC: 32.5 g/dL (ref 30.0–36.0)
MCV: 94.1 fL (ref 80.0–100.0)
Monocytes Absolute: 0.5 10*3/uL (ref 0.1–1.0)
Monocytes Relative: 8 %
Neutro Abs: 4.3 10*3/uL (ref 1.7–7.7)
Neutrophils Relative %: 78 %
Platelet Count: 252 10*3/uL (ref 150–400)
RBC: 3.76 MIL/uL — ABNORMAL LOW (ref 4.22–5.81)
RDW: 14.3 % (ref 11.5–15.5)
WBC Count: 5.5 10*3/uL (ref 4.0–10.5)
nRBC: 0 % (ref 0.0–0.2)

## 2020-04-16 DIAGNOSIS — H401133 Primary open-angle glaucoma, bilateral, severe stage: Secondary | ICD-10-CM | POA: Diagnosis not present

## 2020-04-18 ENCOUNTER — Ambulatory Visit (INDEPENDENT_AMBULATORY_CARE_PROVIDER_SITE_OTHER): Payer: PPO | Admitting: *Deleted

## 2020-04-18 DIAGNOSIS — I480 Paroxysmal atrial fibrillation: Secondary | ICD-10-CM | POA: Diagnosis not present

## 2020-04-18 LAB — CUP PACEART REMOTE DEVICE CHECK
Date Time Interrogation Session: 20210718231657
Implantable Pulse Generator Implant Date: 20200709

## 2020-04-19 ENCOUNTER — Telehealth: Payer: Self-pay

## 2020-04-19 NOTE — Telephone Encounter (Signed)
Dawn from Afib clinic called and asked me to walk patient daughter through on how to send a transmission. Patient thinks he has been in afib per [facility] and his symptoms are SOB and lack of energy for 3 days. Patient transmission did go through successfully. Please review and let afib clinic know

## 2020-04-19 NOTE — Progress Notes (Signed)
Carelink Summary Report / Loop Recorder 

## 2020-04-19 NOTE — Telephone Encounter (Signed)
Manual transmission received.  It does not appear to be AFIB, it appears pt has not been in AF since May.  Forwarding to AF clinic for confirmation

## 2020-04-20 ENCOUNTER — Telehealth (HOSPITAL_COMMUNITY): Payer: Self-pay | Admitting: Licensed Clinical Social Worker

## 2020-04-20 ENCOUNTER — Other Ambulatory Visit (HOSPITAL_COMMUNITY): Payer: Self-pay

## 2020-04-20 ENCOUNTER — Telehealth (HOSPITAL_COMMUNITY): Payer: Self-pay | Admitting: *Deleted

## 2020-04-20 NOTE — Progress Notes (Signed)
Paramedicine Encounter    Patient ID: Troy Boulet [name], male    DOB: 04-27-1941, 79 y.o.   MRN: [id number]   Patient Care Team: Binnie Rail, MD as PCP - General (Internal Medicine) Thompson Grayer, MD as PCP - Electrophysiology (Cardiology) Everardo All, MD (Hematology and Oncology) Lerry Paterson, MD as Referring Physician (Thoracic Surgery) Larey Dresser, MD as Consulting Physician (Cardiology) Susa Day, MD as Consulting Physician (Orthopedic Surgery)  Patient Active Problem List   Diagnosis Date Noted  . Secondary hypercoagulable state ([place]) 03/22/2020  . History of colon cancer 01/08/2020  . Epigastric pain 01/08/2020  . Abnormal findings on esophagogastroduodenoscopy (EGD) 01/08/2020  . Calculus of gallbladder without cholecystitis without obstruction 01/08/2020  . Vertigo 11/25/2019  . Paroxysmal atrial fibrillation ([place]) 04/09/2019  . Cancer of sigmoid colon ([place]) 12/12/2018  . Pleural effusion on right 07/15/2018  . Disease of lymph node   . Colon adenocarcinoma ([place]) 06/30/2018  . Acute GI bleeding 06/23/2018  . Depression 05/06/2018  . Syncope 01/29/2018  . Chronic diastolic CHF (congestive heart failure) ([place]) 05/04/2017  . CAD (coronary artery disease) 05/03/2017  . MGUS (monoclonal gammopathy of unknown significance) 03/07/2016  . Benign essential HTN 01/19/2016  . OSA (obstructive sleep apnea) 08/29/2015  . PAF (paroxysmal atrial fibrillation) ([place])   . Glaucoma 06/22/2015  . DOE (dyspnea on exertion) 02/04/2015  . Memory loss, short term 08/03/2014  . Anemia, unspecified 12/28/2013  . Occlusion and stenosis of carotid artery without mention of cerebral infarction 07/09/2013  . COPD GOLD II  07/09/2013  . Chronic renal disease, stage 3, moderately decreased glomerular filtration rate between 30-59 mL/min/1.73 square meter (HCC) 07/06/2013  . TIA (transient ischemic attack) 07/06/2013  . Basal cell carcinoma of skin 10/10/2012  . History of  lung cancer 03/11/2012  . PAD (peripheral artery disease) ([name]) 12/16/2011  . AAA (abdominal aortic aneurysm) ([place]) 09/27/2011  . Squamous cell lung cancer ([place]) 11/08/2010  . GAIT IMBALANCE 03/02/2009  . Prediabetes 03/02/2009  . History of colonic polyps 03/02/2009  . OBSTRUCTIVE CHRONIC BRONCHITIS WITHOUT EXACERBAT 01/15/2008  . HYPERPLASIA PROSTATE UNS W/O UR OBST & OTH LUTS 01/15/2008  . HYPERLIPIDEMIA 09/29/2007  . Peripheral vascular disease ([place]) 09/29/2007  . LEUKOPLAKIA, VOCAL CORDS 09/29/2007  . FEMORAL BRUIT 09/29/2007    Current Outpatient Medications:  .  acetaminophen (TYLENOL) 325 MG tablet, Take 325 mg by mouth every 6 (six) hours as needed for moderate pain or headache. , Disp: , Rfl:  .  amLODipine (NORVASC) 5 MG tablet, Take 1 tablet (5 mg total) by mouth daily., Disp: 30 tablet, Rfl: 6 .  apixaban (ELIQUIS) 5 MG TABS tablet, Take 1 tablet (5 mg total) by mouth 2 (two) times daily., Disp: 60 tablet, Rfl: 6 .  Ascorbic Acid (VITAMIN C PO), Take 500 mg by mouth in the morning and at bedtime. , Disp: , Rfl:  .  AZOPT 1 % ophthalmic suspension, Place 1 drop into both eyes in the morning and at bedtime. , Disp: , Rfl:  .  bimatoprost (LUMIGAN) 0.01 % SOLN, Place 1 drop at bedtime into both eyes. , Disp: , Rfl:  .  Biotin 2500 MCG CAPS, Take 2,500 mcg by mouth daily., Disp: , Rfl:  .  docusate sodium (COLACE) 100 MG capsule, Take 100 mg by mouth 2 (two) times daily. , Disp: , Rfl:  .  famotidine (PEPCID) 20 MG tablet, Take 20 mg by mouth 2 (two) times daily., Disp: , Rfl:  .  ferrous  sulfate 325 (65 FE) MG tablet, Take 325 mg by mouth 2 (two) times daily with a meal. , Disp: , Rfl:  .  FLUoxetine (PROZAC) 40 MG capsule, Take 1 capsule (40 mg total) by mouth daily., Disp: 90 capsule, Rfl: 3 .  furosemide (LASIX) 20 MG tablet, Take 2 tablets (40 mg total) by mouth daily., Disp: 60 tablet, Rfl: 6 .  losartan (COZAAR) 25 MG tablet, Take 2 tablets (50 mg total) by mouth daily.,  Disp: 60 tablet, Rfl: 5 .  Magnesium 500 MG CAPS, Take 500 mg by mouth daily. , Disp: , Rfl:  .  metoprolol succinate (TOPROL-XL) 25 MG 24 hr tablet, Take 0.5 tablets (12.5 mg total) by mouth at bedtime., Disp: 15 tablet, Rfl: 6 .  montelukast (SINGULAIR) 10 MG tablet, TAKE ONE TABLET AT BEDTIME., Disp: 28 tablet, Rfl: 0 .  Multiple Vitamins-Minerals (CENTRUM SILVER PO), Take 1 tablet by mouth daily., Disp: , Rfl:  .  omeprazole (PRILOSEC) 20 MG capsule, Take 1 capsule (20 mg total) by mouth 2 (two) times daily before a meal. Take 30- 60 min before your first and last meals of the day, Disp: 180 capsule, Rfl: 1 .  Polyethyl Glycol-Propyl Glycol (SYSTANE) 0.4-0.3 % SOLN, Place 1 drop into both eyes daily as needed (for dry eyes). , Disp: , Rfl:  .  potassium chloride SA (KLOR-CON) 20 MEQ tablet, Take 1 tablet (20 mEq total) by mouth daily., Disp: 30 tablet, Rfl: 6 .  rosuvastatin (CRESTOR) 40 MG tablet, Take 1 tablet (40 mg total) by mouth daily., Disp: 30 tablet, Rfl: 6 .  senna (SENOKOT) 8.6 MG tablet, Take 1 tablet by mouth 2 (two) times daily., Disp: , Rfl:  .  SYMBICORT 160-4.5 MCG/ACT inhaler, USE 2 PUFFS TWICE DAILY. (Patient taking differently: Inhale 2 puffs into the lungs in the morning and at bedtime. ), Disp: 10.2 g, Rfl: 5 .  Melatonin 5 MG TABS, Take 5 mg by mouth at bedtime., Disp: , Rfl:  .  Multiple Vitamins-Minerals (ZINC PO), Take 1 tablet by mouth daily., Disp: , Rfl:  Allergies  Allergen Reactions  . Niacin-Lovastatin Er Shortness Of Breath and Other (See Comments)    Dyspnea, flushing  . Penicillins Hives, Shortness Of Breath and Other (See Comments)    Flushing  & dyspnea Because of a history of documented adverse serious drug reaction;Medi Alert bracelet  is recommended PCN reaction causing immediate rash, facial/tongue/throat swelling, SOB or lightheadedness with hypotension: Yes PCN reaction causing severe rash involving mucus membranes or skin necrosis: No PCN reaction  occurring within the last 10 years: NO PCN reaction that required hospitalization: NO  . Sulfonamide Derivatives Hives, Shortness Of Breath and Other (See Comments)    Flushing & dyspnea Because of a history of documented adverse serious drug reaction;Medi Alert bracelet  is recommended  . Atorvastatin Other (See Comments)    Myalgias & athralgias- takes Crestor, DOES NOT LIKE LIPITOR      Social History   Socioeconomic History  . Marital status: Married    Spouse name: Troy [name]   . Number of children: 3  . Years of education: 12+  . Highest education level: Not on file  Occupational History  . 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 Smoking status: Former Smoker    Packs/day: 0.50    Years: 50.00    Pack  years: 25.00    Types: Cigarettes    Quit date: 07/28/2014    Years since quitting: 5.7  . Smokeless tobacco: Never Used  Vaping Use  . Vaping Use: Never used  Substance and Sexual Activity  . Alcohol use: Yes    Alcohol/week: 4.0 standard drinks    Types: 1 Glasses of wine, 1 Cans of beer, 1 Shots of liquor, 1 Standard drinks or equivalent per week    Comment:  socially, variable  . Drug use: No  . Sexual activity: Not on file  Other Topics Concern  . Not on file  Social History Narrative   Patient lives at home with spouse Troy [name]   Patient has 3 children    Patient is right handed    Social Determinants of Health   Financial Resource Strain:   . Difficulty of Paying Living Expenses:   Food Insecurity:   . Worried About Charity fundraiser in the Last Year:   . Arboriculturist in the Last Year:   Transportation Needs:   . Film/video editor (Medical):   [name] Lack of Transportation (Non-Medical):   Physical Activity:   . Days of Exercise per Week:   . Minutes of Exercise per Session:   Stress:   . Feeling of Stress :   Social  Connections:   . Frequency of Communication with Friends and Family:   . Frequency of Social Gatherings with Friends and Family:   . Attends Religious Services:   . Active Member of Clubs or Organizations:   . Attends Archivist Meetings:   [name] Marital Status:   Intimate Partner Violence:   . Fear of Current or Ex-Partner:   . Emotionally Abused:   [name] Physically Abused:   . Sexually Abused:     Physical Exam      Future Appointments  Date Time Provider [place]  04/21/2020  1:00 PM Larey Dresser, MD [facility] None  05/09/2020 10:20 AM Larey Dresser, MD [facility] None  05/16/2020  3:15 PM Thompson Grayer, MD [facility]  05/23/2020 11:25 AM [facility] DEVICE REMOTES [facility]  06/02/2020  2:40 PM Philemon Kingdom, MD [facility] None  06/27/2020 11:25 AM [facility] DEVICE REMOTES [facility]  07/15/2020 11:30 AM [facility] LAB 3 [facility] None  07/15/2020 12:00 PM Ladell Pier, MD [facility] None  08/01/2020 11:25 AM [facility] DEVICE REMOTES [facility]  09/05/2020 11:25 AM [facility] DEVICE REMOTES [facility]  10/10/2020 11:25 AM [facility] DEVICE REMOTES [facility]    BP (!) 110/52   Pulse 68   Resp 20   SpO2 94%  CBG [id number]  Pts daughter called clinic to report he is not feeling well, he reports past few days he has felt lethargic, he has been increased sob. Constipated for past 4-5 days.  Appetite is below average.  His wife died a month ago and he has been grieving from that.  Drinking some fluids. Family reports not enough fluids.  A month ago his wife passed away and she tended to all his medical needs. Caregiver is here now 24/7 and she reports he has not been complaint with meds.  [facility] and has bubble packs. He uses pill box for his supplements/vitamins.   He gets very sob upon ambulating.  He initially sp02 was 88%, after a few deep  breaths and rest it did come up to 94%.  Lungs clear. Right lower lobe is removed.  Minimal edema to his  feet if any at all.  Heart rate regular, every now and then he is irregular. 12 lead done no abnormal beats.  Can weigh daily due to poor sight and last week it was 194.  Will suggest to family talking scales.  This information was given to heather, nurse at clinic and she will speak to dr Aundra Dubin when he returns this afternoon and go from there about further.     Troy [name], [facility] Paramedic  04/20/20

## 2020-04-20 NOTE — Telephone Encounter (Signed)
CSW consulted to assist with getting Community paramedicine involved to address immediate home concerns with pt health.  Clinic received call with concerns about pt being SOB, possible abdominal distention, and concerns about pt taking medication appropriately.  CSW placed referral to [employer] and paramedic will make home visit today to address current concerns and address as needed.  CSW will continue to follow through [employer] program.  Jorge Ny, Alex[facility] Worker [name] Desk#: [phone] Cell#: [phone]

## 2020-04-20 NOTE — Telephone Encounter (Signed)
Steffanie Dunn called concerned about pt, she states he has been much more SOB since Sunday and it is getting worse. He spoke w/device and afib clinic yesterday and was told he is not in afib to f/u with [name]. She states she is unsure about his medications as he can not see well and she often finds pills in the floor that he has dropped. He is suppose to be on Lasix 40 mg Daily but she doesn't feel that he is taking any meds correctly. She states he doesn't weight regularly, doesn't seem to have much swelling, abd seems larger but pt reports no BM for several days. Have discussed having community paramedic come out and check on pt and making a plan from there. Pt and daughter agreeable with this, have spoken w/Jenna, CSW she will contact paramedics and have someone go out and see him today.

## 2020-04-20 NOTE — Telephone Encounter (Signed)
Spoke w/pt's daughter, she is aware and will bring pt to appt tomorrow at 1 pm

## 2020-04-20 NOTE — Telephone Encounter (Signed)
Can I see him tomorrow at 1? Would have Katie give him Lasix 40 mg IV today if she can go back over there.  If not, make sure he takes his Lasix as ordered and come to see me in am.

## 2020-04-20 NOTE — Telephone Encounter (Signed)
Katie w/paramedicine called to report on findings, she states pt is SOB w/exertion however lungs CTA, O2 sat was initiallly 88% put quickly improved to 94-95% and has maintained. She states he only has trace edema if any, HR 68, denies fever/chills/cp/cough/dizziness, he does c/o increased fatigue and no appetite. With real volume issue will send to Dr Aundra Dubin for further recommendations, pt does have hx of COPD may need to see pulmonology

## 2020-04-21 ENCOUNTER — Encounter (HOSPITAL_COMMUNITY): Payer: Self-pay | Admitting: Cardiology

## 2020-04-21 ENCOUNTER — Ambulatory Visit (HOSPITAL_COMMUNITY)
Admission: RE | Admit: 2020-04-21 | Discharge: 2020-04-21 | Disposition: A | Discharge status: Home / Self Care | Payer: PPO | Source: Ambulatory Visit | Attending: Cardiology | Admitting: Cardiology

## 2020-04-21 ENCOUNTER — Other Ambulatory Visit: Payer: Self-pay

## 2020-04-21 VITALS — BP 110/60 | HR 58 | Wt 198.6 lb

## 2020-04-21 DIAGNOSIS — Z87891 Personal history of nicotine dependence: Secondary | ICD-10-CM | POA: Insufficient documentation

## 2020-04-21 DIAGNOSIS — R0600 Dyspnea, unspecified: Secondary | ICD-10-CM

## 2020-04-21 DIAGNOSIS — Z8673 Personal history of transient ischemic attack (TIA), and cerebral infarction without residual deficits: Secondary | ICD-10-CM | POA: Insufficient documentation

## 2020-04-21 DIAGNOSIS — I130 Hypertensive heart and chronic kidney disease with heart failure and stage 1 through stage 4 chronic kidney disease, or unspecified chronic kidney disease: Secondary | ICD-10-CM | POA: Diagnosis not present

## 2020-04-21 DIAGNOSIS — I6529 Occlusion and stenosis of unspecified carotid artery: Secondary | ICD-10-CM | POA: Insufficient documentation

## 2020-04-21 DIAGNOSIS — N183 Chronic kidney disease, stage 3 unspecified: Secondary | ICD-10-CM | POA: Insufficient documentation

## 2020-04-21 DIAGNOSIS — I480 Paroxysmal atrial fibrillation: Secondary | ICD-10-CM | POA: Diagnosis not present

## 2020-04-21 DIAGNOSIS — Z85118 Personal history of other malignant neoplasm of bronchus and lung: Secondary | ICD-10-CM | POA: Diagnosis not present

## 2020-04-21 DIAGNOSIS — I714 Abdominal aortic aneurysm, without rupture: Secondary | ICD-10-CM | POA: Insufficient documentation

## 2020-04-21 DIAGNOSIS — E785 Hyperlipidemia, unspecified: Secondary | ICD-10-CM | POA: Insufficient documentation

## 2020-04-21 DIAGNOSIS — H409 Unspecified glaucoma: Secondary | ICD-10-CM | POA: Diagnosis not present

## 2020-04-21 DIAGNOSIS — I5032 Chronic diastolic (congestive) heart failure: Secondary | ICD-10-CM

## 2020-04-21 DIAGNOSIS — J449 Chronic obstructive pulmonary disease, unspecified: Secondary | ICD-10-CM | POA: Insufficient documentation

## 2020-04-21 DIAGNOSIS — E0590 Thyrotoxicosis, unspecified without thyrotoxic crisis or storm: Secondary | ICD-10-CM | POA: Diagnosis not present

## 2020-04-21 DIAGNOSIS — Z9989 Dependence on other enabling machines and devices: Secondary | ICD-10-CM | POA: Insufficient documentation

## 2020-04-21 DIAGNOSIS — Z902 Acquired absence of lung [part of]: Secondary | ICD-10-CM | POA: Diagnosis not present

## 2020-04-21 DIAGNOSIS — Z85038 Personal history of other malignant neoplasm of large intestine: Secondary | ICD-10-CM | POA: Diagnosis not present

## 2020-04-21 DIAGNOSIS — Z7951 Long term (current) use of inhaled steroids: Secondary | ICD-10-CM | POA: Insufficient documentation

## 2020-04-21 DIAGNOSIS — Z79899 Other long term (current) drug therapy: Secondary | ICD-10-CM | POA: Diagnosis not present

## 2020-04-21 DIAGNOSIS — J90 Pleural effusion, not elsewhere classified: Secondary | ICD-10-CM | POA: Diagnosis not present

## 2020-04-21 DIAGNOSIS — I739 Peripheral vascular disease, unspecified: Secondary | ICD-10-CM | POA: Insufficient documentation

## 2020-04-21 DIAGNOSIS — Z7901 Long term (current) use of anticoagulants: Secondary | ICD-10-CM | POA: Diagnosis not present

## 2020-04-21 DIAGNOSIS — G4733 Obstructive sleep apnea (adult) (pediatric): Secondary | ICD-10-CM | POA: Insufficient documentation

## 2020-04-21 DIAGNOSIS — Z9049 Acquired absence of other specified parts of digestive tract: Secondary | ICD-10-CM | POA: Diagnosis not present

## 2020-04-21 LAB — CBC
HCT: 34.5 % — ABNORMAL LOW (ref 39.0–52.0)
Hemoglobin: 10.7 g/dL — ABNORMAL LOW (ref 13.0–17.0)
MCH: 30.1 pg (ref 26.0–34.0)
MCHC: 31 g/dL (ref 30.0–36.0)
MCV: 96.9 fL (ref 80.0–100.0)
Platelets: 302 10*3/uL (ref 150–400)
RBC: 3.56 MIL/uL — ABNORMAL LOW (ref 4.22–5.81)
RDW: 14.2 % (ref 11.5–15.5)
WBC: 5.2 10*3/uL (ref 4.0–10.5)
nRBC: 0 % (ref 0.0–0.2)

## 2020-04-21 LAB — BASIC METABOLIC PANEL
Anion gap: 11 (ref 5–15)
BUN: 24 mg/dL — ABNORMAL HIGH (ref 8–23)
CO2: 21 mmol/L — ABNORMAL LOW (ref 22–32)
Calcium: 8.9 mg/dL (ref 8.9–10.3)
Chloride: 105 mmol/L (ref 98–111)
Creatinine, Ser: 1.79 mg/dL — ABNORMAL HIGH (ref 0.61–1.24)
GFR calc Af Amer: 41 mL/min — ABNORMAL LOW (ref >60)
GFR calc non Af Amer: 35 mL/min — ABNORMAL LOW (ref >60)
Glucose, Bld: 121 mg/dL — ABNORMAL HIGH (ref 70–99)
Potassium: 3.9 mmol/L (ref 3.5–5.1)
Sodium: 137 mmol/L (ref 135–145)

## 2020-04-21 LAB — TSH: TSH: 1.166 u[IU]/mL (ref 0.350–4.500)

## 2020-04-21 NOTE — Patient Instructions (Signed)
Increase Furosemide to 40 mg Twice daily   Labs done today, your results will be available in MyChart, we will contact you for abnormal readings.  Labs in 10 days  Chest X-Ray today  Your physician recommends that you schedule a follow-up appointment in: 3 weeks  If you have any questions or concerns before your next appointment please send [name] a message through [place] or call our office at [phone].    TO LEAVE A MESSAGE FOR THE NURSE SELECT OPTION 2, PLEASE LEAVE A MESSAGE INCLUDING: . YOUR NAME . DATE OF BIRTH . CALL BACK NUMBER . REASON FOR CALL**this is important as we prioritize the call backs  [place] AS LONG AS YOU CALL BEFORE 4:00 PM  At the [facility], you and your health needs are our priority. As part of our continuing mission to provide you with exceptional heart care, we have created designated Provider Care Teams. These Care Teams include your primary Cardiologist (physician) and Advanced Practice Providers (APPs- Physician Assistants and Nurse Practitioners) who all work together to provide you with the care you need, when you need it.   You may see any of the following providers on your designated Care Team at your next follow up: [name] Dr Glori Bickers . Dr [name] . Darrick Grinder, NP . Lyda Jester, PA . Audry Riles, PharmD   Please be sure to bring in all your medications bottles to every appointment.

## 2020-04-21 NOTE — Progress Notes (Signed)
ReDS Vest / Clip - 04/21/20 1404      ReDS Vest / Clip   Station Marker C    Ruler Value 29    ReDS Value Range High volume overload    ReDS Actual Value 44    Anatomical Comments Sitting

## 2020-04-21 NOTE — Progress Notes (Signed)
Patient ID: Troy Hanrahan [name], male   DOB: 09/10/1941, 79 y.o.   MRN: [id number] PCP: Dr. Quay Burow Cardiology: [name]  79 y.o. with history of HTN, COPD, active smoking/COPD, carotid stenosis s/p right CEA, paroxysmal atrial fibrillation, and PAD returns for followup of CHF and atrial fibrillation.  He does not have known obstructive CAD but is at high risk for CAD based on his comorbidities.  Lexiscan Cardiolite in 3/14 showed no ischemia or infarction and echo in 3/14 showed normal EF.  He had a left fem-pop bypass at the [facility] in the '90s.  He is followed at [facility] for PAD. He has chronic right calf/thigh/buttocks claudication that is unchanged over the last few years and follows regularly at [facility].  He has had right lower lobectomy for lung cancer.  He continues to stay off cigarettes.    At a prior appointment, he was in atrial fibrillation but did not realize it.  I started him on Eliquis and diltiazem CD, and he spontaneously converted to NSR.  In 5/15, he was at [facility] and felt "strange" one day: fatigued, weak, short of breath.  He went to the ER and was in atrial fibrillation with HR in 80s-90s.  He spontaneously converted to NSR in the ER.  He felt back to normal after converting to NSR.  I started him on Multaq 400 mg bid.  With CHF, he was eventually transitioned over to amiodarone.   He had patch angioplasty revision of left fem-pop bypass in 11/16.  Now with minimal claudication.  He follows with [facility].     He had a Cardiolite and echo in 7/17 that were unremarkable.  Repeat Cardiolite in 12/17 showed no ischemia, echo was uninterpretable.  Cardiac MRI was therefore done in 1/18, showing EF 66% with normal-appearing RV, no late gadolinium enhancement.   Given increased exertional dyspnea and a defect on Cardiolite, he had LHC in 11/18.  This showed no obstructive CAD. He had a chest CT done given concern for possible amiodarone lung toxicity with increased dyspnea. This showed  emphysema, no ILD.    In 5/19, he developed a probable viral syndrome with dehydration and had a presyncopal episode.  He was orthostatic in the hospital.  He was noted to be in atrial fibrillation this admission which remained persistent.  He had a TEE-guided DCCV back to NSR.   In 9/19, he was admitted with GI bleeding and found to have a sigmoid mass on colonoscopy, path showed sigmoid adenocarcinoma.  He was noted to have right paratracheal lymphadenopathy but EUS with biopsy in 10/19 did not show malignancy.  He had surgical resection of colon cancer.   Amiodarone was stopped with elevated ESR and increased dyspnea.  He was also found to have hyperthyroidism. He was treated with methimazole by endocrinology.    He had atrial fibrillation ablation in 7/20.   Echo in 4/21 showed EF 60-65%, normal RV.    In 5/21, due to high atrial fibrillation burden, he had redo atrial fibrillation ablation.   Cardiolite in 6/21 showed EF 70%, no ischemia/infarction.   He returns today for followup of diastolic CHF and atrial fibrillation.  His wife passed away unexpectedly in Feb 16, 2023 and he has been grieving, has not left the house much.  For the last few weeks, he has been more short of breath.  This waxes and wanes, but generally short of breath walking across his house.  Able to walk into office today but winded.  He  has had orthopnea, sleeping on 2 pillows.  No chest pain.  Poor appetite.  Atrial fibrillation burden has been minimal post-redo ablation. Weight stable but eating very little. He has mild cramping in thighs with walking.   REDS clip 44%  ECG (personally reviewed): NSR, no significant abnormality    Labs (3/13): LDL 75, HDL 35, K 4.1, creatinine 1.1 Labs (3/14): LDL 91, HDL 27 Labs (10/14): K 4.3, creatinine 0.98 Labs (4/15): K 4.9, creatinine 1.1, LDL 76, HDL 30 Labs (5/15): K 4.3, creatinine 1.7, BNP 251 Labs (6/15): K 4.4, creatinine 1.3 Labs (10/15): TSH normal Labs (5/16): K 4.2,  creatinine 1.12, HCT 39.5, LFTs normal, LDL 84, HDL 43, TSH normal Labs (11/16): creatinine 0.94 Labs (4/17): LDL 39, HDL 39 Labs (6/17): K 4.5, creatinine 1.2 Labs (9/17): K 4, creatinine 1.25, HCT 38.7 Labs (12/17): K 4.5, creatinine 1.49 => 1.62, BNP 43 Labs (2/18): K 3.8, creatinine 1.33, LFTs normal, TSH normal Labs (6/18): K 4.3, creatinine 1.49, LFTs normal, TSH normal, hgb 12.3 Labs (11/18): ESR 60, TSH normal, LFTs normal, creatinine 1.34 Labs (1/19): LDL 50 Labs (5/19): K 4.6, creatinine 1.55, LFTs normal, hgb 12.7, LFTs normal Labs (10/19): K 4 => 4.1, creatinine 1.39 => 1.43, BNP 279, hgb 10.7, TSH low, free T4 and free T3 low, ESR 108 Labs (3/21): K 4.5, creatinine 1.19, TSH normal Labs (4/21): K 4.8, creatinine 1.28, BNP 524, LDL 59 Labs (7/21): hgb 11.5  PMH: 1. HTN: Fatigue and cough with ramipril use.  2. COPD: Quit smoking 2014. PFTs (1/18) with moderately severe COPD.  - CT chest (11/18): Emphysema noted, no ILD.  3. AAA: CT 1/13 with 3.0 cm AAA.  Abdominal US (1/14) with 3.4 cm AAA. Abdominal US (4/15) with 3.25 x 3.27 AAA. Abdominal US (3/16) with 3.7 cm AAA.  Followed at [facility].  - Abd [name] (1/19): 3.3 cm AAA. 4. Squamous cell lung cancer diagnosed 2/12.  Had right lower lobectomy in 4/12.  5. Hyperlipidemia 6. PAD: Left fem-pop bypass 1994.  ABIs (2/12) 0.62 on right, 0.95 on left. ABIs (1/14): 0.65 on right, 1.04 on left. ABIs (4/15) 0.66 right, 0.87 left.  ABIs (3/16) 0.6 right, 0.88 left.  Patch angioplasty left fem-pop bypass in 11/16.   - Left fem-pop bypass patent on doppler evaluation in 12/17.  - ABIs (2/21): normal on left (1.02), moderately decreased on right (0.54).  7. CAD: Stress myoview 2004 was normal. Lexiscan Cardiolite (3/14) with EF 66%, no ischemia or infarction. Lexiscan Cardiolite (7/17) with EF 61%, no ischemia/infarction.  - Cardiolite (12/17) with EF 62%, inferior/inferolateral fixed defect, most likely diaphragmatic attenuation, no ischemia.  -  Cardiolite (9/18) with EF > 65%, fixed inferior defect (attenuation versus infarction), no ischemia.  - LHC (11/18): No obstructive CAD.  - Cardiolite (6/21): EF 70%, no ischemia/infarction 8. Chronic diastolic CHF: Echo (6/71): technically difficult with EF 55-60%, upper normal RV size.  Echo (5/16) with EF 60-65%.  Echo (7/17) with EF 55-60%, normal RV size and systolic function.  - Echo 12/17 with very poor windows, unable to comment on LV or RV function.  - Cardiac MRI (1/18) with EF 66%, normal RV size and systolic function.  - TEE (5/19): EF 55-60%, normal RV size and systolic function.  - Echo (4/21): EF 60-65%, normal RV 9. Carotid stenosis: TIA 10/14.  Carotid dopplers with > 24% RICA, 58-09% LICA.  Patient had right CEA in 10/14. Carotids (4/15) patent right CEA, LICA 98-33% stenosis.  Carotids (10/15) < 40%  LICA, patent right CEA.  - Carotid dopplers (12/17): right CEA ok, < 36% LICA stenosis.  - Carotid dopplers (1/19): Right CEA ok, 1-44% LICA stenosis.  - Carotid dopplers (2/21): Right CEA ok, LICA 31-54% stenosis.  10. Atrial fibrillation: Paroxysmal. First noted after lobectomy in 4/12 (brief), recurrence in 4/15 then in 5/15.  - TEE-guided DCCV for persistent atrial fibrillation in 5/19.  - Atrial fibrillation ablation 7/20.  - Re-do atrial fibrillation ablation 5/21.  11. Spinal stenosis 12. OSA: Using CPAP.  13. Glaucoma 14. Has ILR 15. Colon cancer: Sigmoid adenocarcinoma, s/p surgical resection.  16. 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   SH: Married, lives in [place].  Former [employer].  Quit smoking in 2014.  Rare ETOH now.   FH: No premature CAD  ROS: All systems reviewed and negative except as per HPI.   Current Outpatient Medications  Medication Sig Dispense Refill  . acetaminophen (TYLENOL) 325 MG tablet Take 325 mg by mouth every 6 (six) hours as needed for moderate pain or headache.     [name] amLODipine (NORVASC) 5 MG tablet  Take 1 tablet (5 mg total) by mouth daily. 30 tablet 6  . apixaban (ELIQUIS) 5 MG TABS tablet Take 1 tablet (5 mg total) by mouth 2 (two) times daily. 60 tablet 6  . Ascorbic Acid (VITAMIN C PO) Take 500 mg by mouth in the morning and at bedtime.     . Biotin 2500 MCG CAPS Take 2,500 mcg by mouth daily.    . Brinzolamide-Brimonidine (SIMBRINZA) 1-0.2 % SUSP Place 1 drop into both eyes 2 times daily.    . budesonide-formoterol (SYMBICORT) 160-4.5 MCG/ACT inhaler Inhale 2 puffs into the lungs 2 (two) times daily.    [name] docusate sodium (COLACE) 100 MG capsule Take 100 mg by mouth 2 (two) times daily.     . famotidine (PEPCID) 20 MG tablet Take 20 mg by mouth 2 (two) times daily.    . ferrous sulfate 325 (65 FE) MG tablet Take 325 mg by mouth 2 (two) times daily with a meal.     . FLUoxetine (PROZAC) 40 MG capsule Take 1 capsule (40 mg total) by mouth daily. 90 capsule 3  . furosemide (LASIX) 20 MG tablet Take 2 tablets (40 mg total) by mouth daily. 60 tablet 6  . losartan (COZAAR) 25 MG tablet Take 2 tablets (50 mg total) by mouth daily. 60 tablet 5  . Magnesium 500 MG CAPS Take 500 mg by mouth daily.     . Melatonin 5 MG TABS Take 5 mg by mouth at bedtime.    . metoprolol succinate (TOPROL-XL) 25 MG 24 hr tablet Take 0.5 tablets (12.5 mg total) by mouth at bedtime. 15 tablet 6  . montelukast (SINGULAIR) 10 MG tablet TAKE ONE TABLET AT BEDTIME. 28 tablet 0  . Multiple Vitamins-Minerals (CENTRUM SILVER PO) Take 1 tablet by mouth daily.    . Multiple Vitamins-Minerals (ZINC PO) Take 1 tablet by mouth daily.    [name] omeprazole (PRILOSEC) 20 MG capsule Take 1 capsule (20 mg total) by mouth 2 (two) times daily before a meal. Take 30- 60 min before your first and last meals of the day 180 capsule 1  . Polyethyl Glycol-Propyl Glycol (SYSTANE) 0.4-0.3 % SOLN Place 1 drop into both eyes daily as needed (for dry eyes).     . potassium chloride SA (KLOR-CON) 20 MEQ tablet Take 1 tablet (20 mEq total) by mouth  daily. 30 tablet 6  .  rosuvastatin (CRESTOR) 40 MG tablet Take 1 tablet (40 mg total) by mouth daily. 30 tablet 6  . senna (SENOKOT) 8.6 MG tablet Take 1 tablet by mouth 2 (two) times daily.     No current facility-administered medications for this encounter.    BP (!) 110/60   Pulse 58   Wt 90.1 kg (198 lb 9.6 oz)   SpO2 96%   BMI 26.20 kg/m  General: NAD Neck: JVP 8-9 cm, no thyromegaly or thyroid nodule.  Lungs: Decreased BS right base. CV: Nondisplaced PMI.  Heart regular S1/S2, no S3/S4, no murmur.  Trace ankle edema.  No carotid bruit.  Unable to palpate pedal pulses.  Abdomen: Soft, nontender, no hepatosplenomegaly, no distention.  Skin: Intact without lesions or rashes.  Neurologic: Alert and oriented x 3.  Psych: Normal affect. Extremities: No clubbing or cyanosis.  HEENT: Normal.   Assessment/Plan: 1. Chronic diastolic CHF: Echo in 1/09 with EF 60-65%, normal RV.  NYHA class IIIb symptoms, worse recently.  Volume overload on exam and by REDS vest.    - Increase Lasix to 40 mg bid, BMET today and again in 10 days.    - Will get CXR,  2. Hyperlipidemia: Continue Crestor.  Good lipids in 4/21.  3. Carotid stenosis: s/p R CEA.  Dopplers followed at [facility].  4. PAD: s/p patch angioplasty to left fem-pop bypass in 11/16.  Followed at [facility].  Moderately decreased ABI on right in 2/21, but no pain or pedal ulcerations.  Stable mild claudication.  5. AAA: Stable on last [name], followed at [facility].    6. COPD: No longer smoking. PFTs in 1/18 showed moderately severe obstructive defect. CT chest in 11/18 showed emphysema.   7. Atrial fibrillation: S/p atrial fibrillation ablation in 7/20 and redo in 5/21.  He has been primarily in NSR since redo ablation, NSR today.   - Continue Toprol XL 12.5 mg daily.  - Continue Eliquis 5 mg bid.  - Continue to limit ETOH.  - Continue to use CPAP.  8. CKD: Stage 3. BMET today.   9. CAD: LHC in 11/18 with nonobstructive disease only.  Cardiolite in 6/21  with no ischemia/infarction.  10. 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  He is no longer on methimazole, follows with endocrinology.     11. Colon cancer: Sigmoid adenocarcinoma, s/p surgical resection.  12. HTN: Now controlled on amlodipine and losartan.  13. Pleural effusion: Chronic right pleural effusion, s/p right lower lobectomy.       [name] 04/21/2020

## 2020-04-22 ENCOUNTER — Telehealth: Payer: Self-pay | Admitting: Nurse Practitioner

## 2020-04-22 ENCOUNTER — Telehealth (HOSPITAL_COMMUNITY): Payer: Self-pay | Admitting: *Deleted

## 2020-04-22 ENCOUNTER — Other Ambulatory Visit (HOSPITAL_COMMUNITY): Payer: Self-pay | Admitting: *Deleted

## 2020-04-22 MED ORDER — FUROSEMIDE 40 MG PO TABS: 40.0000 mg | ORAL_TABLET | Freq: Two times a day (BID) | ORAL | 3 refills | Status: DC

## 2020-04-22 NOTE — Telephone Encounter (Signed)
[place], RN  04/22/2020 4:50 PM EDT Back to Top    Spoke w/pt's daughter Steffanie Dunn, she is aware and agreeable, pt already sch for repeat labs next week   Larey Dresser, MD  04/21/2020 10:49 PM EDT     Creatinine up but volume overloaded. Stop losartan, repeat BMET on Monday or Tuesday.

## 2020-04-22 NOTE — Telephone Encounter (Signed)
Rescheduled appointment per 7/22 provider message. Patient is aware of updated appointment times.

## 2020-04-25 ENCOUNTER — Encounter: Payer: Self-pay | Admitting: Gastroenterology

## 2020-04-25 ENCOUNTER — Telehealth (HOSPITAL_COMMUNITY): Payer: Self-pay | Admitting: *Deleted

## 2020-04-25 NOTE — Telephone Encounter (Signed)
pts daughter left vm stating pt is still very short of breath and that is was really bad Saturday. She would like to know if his eyedrops lantanoprost could be the cause.

## 2020-04-25 NOTE — Telephone Encounter (Signed)
Increase Lasix to 60 mg bid x 2 days then back to 40 mg bid.  Will need BMET later this week as creatinine is higher than baseline.

## 2020-04-25 NOTE — Telephone Encounter (Signed)
Pts daughter aware and agreeable with plan. Pt has lab appt on  7/29

## 2020-04-27 ENCOUNTER — Other Ambulatory Visit (HOSPITAL_COMMUNITY): Payer: Self-pay

## 2020-04-27 MED ORDER — FUROSEMIDE 40 MG PO TABS: 40.0000 mg | ORAL_TABLET | Freq: Two times a day (BID) | ORAL | 3 refills | Status: DC

## 2020-04-27 NOTE — Progress Notes (Signed)
Paramedicine Encounter    Patient ID: Troy Topper [name], male    DOB: 01/05/1941, 79 y.o.   MRN: [id number]   Patient Care Team: Binnie Rail, MD as PCP - General (Internal Medicine) Thompson Grayer, MD as PCP - Electrophysiology (Cardiology) Everardo All, MD (Hematology and Oncology) Lerry Paterson, MD as Referring Physician (Thoracic Surgery) Larey Dresser, MD as Consulting Physician (Cardiology) Susa Day, MD as Consulting Physician (Orthopedic Surgery)  Patient Active Problem List   Diagnosis Date Noted  . Secondary hypercoagulable state ([place]) 03/22/2020  . History of colon cancer 01/08/2020  . Epigastric pain 01/08/2020  . Abnormal findings on esophagogastroduodenoscopy (EGD) 01/08/2020  . Calculus of gallbladder without cholecystitis without obstruction 01/08/2020  . Vertigo 11/25/2019  . Paroxysmal atrial fibrillation ([place]) 04/09/2019  . Cancer of sigmoid colon ([place]) 12/12/2018  . Pleural effusion on right 07/15/2018  . Disease of lymph node   . Colon adenocarcinoma ([place]) 06/30/2018  . Acute GI bleeding 06/23/2018  . Depression 05/06/2018  . Syncope 01/29/2018  . Chronic diastolic CHF (congestive heart failure) ([place]) 05/04/2017  . CAD (coronary artery disease) 05/03/2017  . MGUS (monoclonal gammopathy of unknown significance) 03/07/2016  . Benign essential HTN 01/19/2016  . OSA (obstructive sleep apnea) 08/29/2015  . PAF (paroxysmal atrial fibrillation) ([place])   . Glaucoma 06/22/2015  . DOE (dyspnea on exertion) 02/04/2015  . Memory loss, short term 08/03/2014  . Anemia, unspecified 12/28/2013  . Occlusion and stenosis of carotid artery without mention of cerebral infarction 07/09/2013  . COPD GOLD II  07/09/2013  . Chronic renal disease, stage 3, moderately decreased glomerular filtration rate between 30-59 mL/min/1.73 square meter (HCC) 07/06/2013  . TIA (transient ischemic attack) 07/06/2013  . Basal cell carcinoma of skin 10/10/2012  . History of  lung cancer 03/11/2012  . PAD (peripheral artery disease) ([place]) 12/16/2011  . AAA (abdominal aortic aneurysm) ([place]) 09/27/2011  . Squamous cell lung cancer ([place]) 11/08/2010  . GAIT IMBALANCE 03/02/2009  . Prediabetes 03/02/2009  . History of colonic polyps 03/02/2009  . OBSTRUCTIVE CHRONIC BRONCHITIS WITHOUT EXACERBAT 01/15/2008  . HYPERPLASIA PROSTATE UNS W/O UR OBST & OTH LUTS 01/15/2008  . HYPERLIPIDEMIA 09/29/2007  . Peripheral vascular disease ([place]) 09/29/2007  . LEUKOPLAKIA, VOCAL CORDS 09/29/2007  . FEMORAL BRUIT 09/29/2007    Current Outpatient Medications:  .  acetaminophen (TYLENOL) 325 MG tablet, Take 325 mg by mouth every 6 (six) hours as needed for moderate pain or headache. , Disp: , Rfl:  .  amLODipine (NORVASC) 5 MG tablet, Take 1 tablet (5 mg total) by mouth daily., Disp: 30 tablet, Rfl: 6 .  apixaban (ELIQUIS) 5 MG TABS tablet, Take 1 tablet (5 mg total) by mouth 2 (two) times daily., Disp: 60 tablet, Rfl: 6 .  Ascorbic Acid (VITAMIN C PO), Take 500 mg by mouth in the morning and at bedtime. , Disp: , Rfl:  .  Biotin 2500 MCG CAPS, Take 2,500 mcg by mouth daily., Disp: , Rfl:  .  Brinzolamide-Brimonidine (SIMBRINZA) 1-0.2 % SUSP, Place 1 drop into both eyes 2 times daily., Disp: , Rfl:  .  budesonide-formoterol (SYMBICORT) 160-4.5 MCG/ACT inhaler, Inhale 2 puffs into the lungs 2 (two) times daily., Disp: , Rfl:  .  docusate sodium (COLACE) 100 MG capsule, Take 100 mg by mouth 2 (two) times daily. , Disp: , Rfl:  .  famotidine (PEPCID) 20 MG tablet, Take 20 mg by mouth 2 (two) times daily., Disp: , Rfl:  .  ferrous sulfate 325 (65  FE) MG tablet, Take 325 mg by mouth 2 (two) times daily with a meal. , Disp: , Rfl:  .  FLUoxetine (PROZAC) 40 MG capsule, Take 1 capsule (40 mg total) by mouth daily., Disp: 90 capsule, Rfl: 3 .  furosemide (LASIX) 40 MG tablet, Take 1 tablet (40 mg total) by mouth 2 (two) times daily., Disp: 60 tablet, Rfl: 3 .  Magnesium 500 MG CAPS, Take 500  mg by mouth daily. , Disp: , Rfl:  .  Melatonin 5 MG TABS, Take 5 mg by mouth at bedtime., Disp: , Rfl:  .  metoprolol succinate (TOPROL-XL) 25 MG 24 hr tablet, Take 0.5 tablets (12.5 mg total) by mouth at bedtime., Disp: 15 tablet, Rfl: 6 .  montelukast (SINGULAIR) 10 MG tablet, TAKE ONE TABLET AT BEDTIME., Disp: 28 tablet, Rfl: 0 .  Multiple Vitamins-Minerals (CENTRUM SILVER PO), Take 1 tablet by mouth daily., Disp: , Rfl:  .  Multiple Vitamins-Minerals (ZINC PO), Take 1 tablet by mouth daily., Disp: , Rfl:  .  omeprazole (PRILOSEC) 20 MG capsule, Take 1 capsule (20 mg total) by mouth 2 (two) times daily before a meal. Take 30- 60 min before your first and last meals of the day, Disp: 180 capsule, Rfl: 1 .  Polyethyl Glycol-Propyl Glycol (SYSTANE) 0.4-0.3 % SOLN, Place 1 drop into both eyes daily as needed (for dry eyes). , Disp: , Rfl:  .  potassium chloride SA (KLOR-CON) 20 MEQ tablet, Take 1 tablet (20 mEq total) by mouth daily., Disp: 30 tablet, Rfl: 6 .  rosuvastatin (CRESTOR) 40 MG tablet, Take 1 tablet (40 mg total) by mouth daily., Disp: 30 tablet, Rfl: 6 .  senna (SENOKOT) 8.6 MG tablet, Take 1 tablet by mouth 2 (two) times daily., Disp: , Rfl:  Allergies  Allergen Reactions  . Niacin-Lovastatin Er Shortness Of Breath and Other (See Comments)    Dyspnea, flushing  . Penicillins Hives, Shortness Of Breath and Other (See Comments)    Flushing  & dyspnea Because of a history of documented adverse serious drug reaction;Medi Alert bracelet  is recommended PCN reaction causing immediate rash, facial/tongue/throat swelling, SOB or lightheadedness with hypotension: Yes PCN reaction causing severe rash involving mucus membranes or skin necrosis: No PCN reaction occurring within the last 10 years: NO PCN reaction that required hospitalization: NO  . Sulfonamide Derivatives Hives, Shortness Of Breath and Other (See Comments)    Flushing & dyspnea Because of a history of documented adverse  serious drug reaction;Medi Alert bracelet  is recommended  . Atorvastatin Other (See Comments)    Myalgias & athralgias- takes Crestor, DOES NOT LIKE LIPITOR     Social History   Socioeconomic History  . Marital status: Married    Spouse name: [name]   . Number of children: 3  . Years of education: 12+  . Highest education level: Not on file  Occupational History  . 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 Smoking status: Former Smoker    Packs/day: 0.50    Years: 50.00    Pack years: 25.00    Types: Cigarettes    Quit date: 07/28/2014    Years since quitting: 5.7  . Smokeless tobacco: Never Used  Vaping Use  . Vaping Use: Never used  Substance and Sexual Activity  . Alcohol use: Yes    Alcohol/week: 4.0 standard drinks  Types: 1 Glasses of wine, 1 Cans of beer, 1 Shots of liquor, 1 Standard drinks or equivalent per week    Comment:  socially, variable  . Drug use: No  . Sexual activity: Not on file  Other Topics Concern  . Not on file  Social History Narrative   Patient lives at home with spouse [name]   Patient has 3 children    Patient is right handed    Social Determinants of Health   Financial Resource Strain:   . Difficulty of Paying Living Expenses:   Food Insecurity:   . Worried About Charity fundraiser in the Last Year:   . Arboriculturist in the Last Year:   Transportation Needs:   . Film/video editor (Medical):   [name] Lack of Transportation (Non-Medical):   Physical Activity:   . Days of Exercise per Week:   . Minutes of Exercise per Session:   Stress:   . Feeling of Stress :   Social Connections:   . Frequency of Communication with Friends and Family:   . Frequency of Social Gatherings with Friends and Family:   . Attends Religious Services:   . Active Member of Clubs or Organizations:   . Attends Theatre manager Meetings:   [name] Marital Status:   Intimate Partner Violence:   . Fear of Current or Ex-Partner:   . Emotionally Abused:   [name] Physically Abused:   . Sexually Abused:     Physical Exam Vitals reviewed.  Constitutional:      Appearance: He is normal weight.  HENT:     Head: Normocephalic.     Nose: Nose normal.     Mouth/Throat:     Mouth: Mucous membranes are moist.  Eyes:     Pupils: Pupils are equal, round, and reactive to light.  Cardiovascular:     Rate and Rhythm: Normal rate and regular rhythm.     Pulses: Normal pulses.     Heart sounds: Normal heart sounds.  Pulmonary:     Effort: Pulmonary effort is normal.     Breath sounds: Normal breath sounds.  Abdominal:     General: Abdomen is flat.  Musculoskeletal:        General: Normal range of motion.     Cervical back: Normal range of motion.     Right lower leg: No edema.     Left lower leg: No edema.  Skin:    General: Skin is warm and dry.     Capillary Refill: Capillary refill takes less than 2 seconds.  Neurological:     Mental Status: He is alert. Mental status is at baseline.     Motor: Weakness present.  Psychiatric:        Mood and Affect: Mood normal.    Arrived for home visit for Mr. Capell who was alert and oriented laying supine on his couch with no obvious shortness of breath. [place] reports he is still having weakness and shortness of breath upon movement. He reported he did some exercise today with his trainer and was only able to do about half of what he normally does due to being short of breath. Lung sounds clear, no edema noted, abdomen soft and non tender. Delonta had his Lasix increased for two days in which he will complete tonight and receive labs tomorrow. I will follow up as needed with same. Medications verified and pill box adjusted. Vitals and assessment as noted. Home visit complete.  Refills: NONE      Future Appointments  Date Time Provider [place]  04/28/2020  1:45  PM [facility] LAB [facility] None  05/09/2020 10:20 AM Larey Dresser, MD [facility] None  05/16/2020  3:15 PM Thompson Grayer, MD [facility]  05/23/2020 11:25 AM [facility] DEVICE REMOTES [facility]  06/02/2020  2:40 PM Philemon Kingdom, MD [facility] None  06/09/2020  4:00 PM [facility] PREVISIT RM50 [facility]  06/23/2020  8:00 AM Mansouraty, Telford Nab., MD [facility]  06/27/2020 11:25 AM [facility] DEVICE REMOTES [facility]  07/15/2020 10:15 AM [facility] LAB 6 [facility] None  07/15/2020 10:45 AM Owens Shark, NP [facility] None  08/01/2020 11:25 AM [facility] DEVICE REMOTES [facility]  09/05/2020 11:25 AM [facility] DEVICE REMOTES [facility]  10/10/2020 11:25 AM [facility] DEVICE REMOTES [facility]     ACTION: Home visit completed Next visit planned for one week

## 2020-04-28 ENCOUNTER — Other Ambulatory Visit: Payer: Self-pay

## 2020-04-28 ENCOUNTER — Ambulatory Visit (HOSPITAL_COMMUNITY)
Admission: RE | Admit: 2020-04-28 | Discharge: 2020-04-28 | Disposition: A | Discharge status: Home / Self Care | Payer: PPO | Source: Ambulatory Visit | Attending: Internal Medicine | Admitting: Internal Medicine

## 2020-04-28 DIAGNOSIS — I5032 Chronic diastolic (congestive) heart failure: Secondary | ICD-10-CM | POA: Diagnosis not present

## 2020-04-28 DIAGNOSIS — R0600 Dyspnea, unspecified: Secondary | ICD-10-CM

## 2020-04-28 LAB — BASIC METABOLIC PANEL
Anion gap: 12 (ref 5–15)
BUN: 16 mg/dL (ref 8–23)
CO2: 23 mmol/L (ref 22–32)
Calcium: 9.1 mg/dL (ref 8.9–10.3)
Chloride: 101 mmol/L (ref 98–111)
Creatinine, Ser: 1.4 mg/dL — ABNORMAL HIGH (ref 0.61–1.24)
GFR calc Af Amer: 55 mL/min — ABNORMAL LOW (ref >60)
GFR calc non Af Amer: 47 mL/min — ABNORMAL LOW (ref >60)
Glucose, Bld: 148 mg/dL — ABNORMAL HIGH (ref 70–99)
Potassium: 3.7 mmol/L (ref 3.5–5.1)
Sodium: 136 mmol/L (ref 135–145)

## 2020-05-02 NOTE — Progress Notes (Signed)
Subjective:    Patient ID: Troy Kindred [name], male    DOB: [birth year], 79 y.o.   MRN: [id number]  HPI The patient is here for follow up of their chronic medical problems, and he complains of feels tired and possibly depressed.  He is here with his daughter.    He is taking all of his medications.    He is exercising regularly.   3/week.  Sees a trainer.   He feels tired.  ? Related to Depression.   Inc SOB 1-2 weeks ago, which has improved.  It improved after he had his ablation.  It is much better this week.    Uses cpap nightly.   Bed 9: -30 - 8-10.  This morning he slept until 10 Wakes frequently    He still feels tired Cat nap 30-90 minutes  He states low energy.        Medications and allergies reviewed with patient and updated if appropriate.  Patient Active Problem List   Diagnosis Date Noted  . Secondary hypercoagulable state ([place]) 03/22/2020  . History of colon cancer 01/08/2020  . Epigastric pain 01/08/2020  . Abnormal findings on esophagogastroduodenoscopy (EGD) 01/08/2020  . Calculus of gallbladder without cholecystitis without obstruction 01/08/2020  . Vertigo 11/25/2019  . Cancer of sigmoid colon ([place]) 12/12/2018  . Pleural effusion on right 07/15/2018  . Disease of lymph node   . Colon adenocarcinoma ([place]) 06/30/2018  . Acute GI bleeding 06/23/2018  . Depression 05/06/2018  . Syncope 01/29/2018  . Chronic diastolic CHF (congestive heart failure) ([place]) 05/04/2017  . CAD (coronary artery disease) 05/03/2017  . MGUS (monoclonal gammopathy of unknown significance) 03/07/2016  . Benign essential HTN 01/19/2016  . OSA (obstructive sleep apnea) 08/29/2015  . PAF (paroxysmal atrial fibrillation) ([place])   . Glaucoma 06/22/2015  . DOE (dyspnea on exertion) 02/04/2015  . Memory loss, short term 08/03/2014  . Anemia, unspecified 12/28/2013  . Occlusion and stenosis of carotid artery without mention of cerebral infarction 07/09/2013  . COPD GOLD II   07/09/2013  . Chronic renal disease, stage 3, moderately decreased glomerular filtration rate between 30-59 mL/min/1.73 square meter (HCC) 07/06/2013  . TIA (transient ischemic attack) 07/06/2013  . Basal cell carcinoma of skin 10/10/2012  . History of lung cancer 03/11/2012  . PAD (peripheral artery disease) ([place]) 12/16/2011  . AAA (abdominal aortic aneurysm) ([place]) 09/27/2011  . Squamous cell lung cancer ([place]) 11/08/2010  . GAIT IMBALANCE 03/02/2009  . Prediabetes 03/02/2009  . History of colonic polyps 03/02/2009  . OBSTRUCTIVE CHRONIC BRONCHITIS WITHOUT EXACERBAT 01/15/2008  . HYPERPLASIA PROSTATE UNS W/O UR OBST & OTH LUTS 01/15/2008  . HYPERLIPIDEMIA 09/29/2007  . Peripheral vascular disease ([name]) 09/29/2007  . LEUKOPLAKIA, VOCAL CORDS 09/29/2007  . FEMORAL BRUIT 09/29/2007    Current Outpatient Medications on File Prior to Visit  Medication Sig Dispense Refill  . amLODipine (NORVASC) 5 MG tablet Take 1 tablet (5 mg total) by mouth daily. 30 tablet 6  . apixaban (ELIQUIS) 5 MG TABS tablet Take 1 tablet (5 mg total) by mouth 2 (two) times daily. 60 tablet 6  . Ascorbic Acid (VITAMIN C PO) Take 500 mg by mouth in the morning and at bedtime.     . Biotin 2500 MCG CAPS Take 2,500 mcg by mouth daily.    . Brinzolamide-Brimonidine (SIMBRINZA) 1-0.2 % SUSP Place 1 drop into both eyes 2 times daily.    . budesonide-formoterol (SYMBICORT) 160-4.5 MCG/ACT inhaler Inhale 2 puffs into the lungs  2 (two) times daily.    [name] docusate sodium (COLACE) 100 MG capsule Take 100 mg by mouth 2 (two) times daily.     . famotidine (PEPCID) 20 MG tablet Take 20 mg by mouth 2 (two) times daily.    . ferrous sulfate 325 (65 FE) MG tablet Take 325 mg by mouth 2 (two) times daily with a meal.     . FLUoxetine (PROZAC) 40 MG capsule Take 1 capsule (40 mg total) by mouth daily. 90 capsule 3  . furosemide (LASIX) 40 MG tablet Take 1 tablet (40 mg total) by mouth 2 (two) times daily. 60 tablet 3  . losartan  (COZAAR) 25 MG tablet Take 25 mg by mouth 2 (two) times daily.    . Magnesium 500 MG CAPS Take 500 mg by mouth daily.     . Melatonin 5 MG TABS Take 5 mg by mouth at bedtime.    . metoprolol succinate (TOPROL-XL) 25 MG 24 hr tablet Take 0.5 tablets (12.5 mg total) by mouth at bedtime. 15 tablet 6  . montelukast (SINGULAIR) 10 MG tablet TAKE ONE TABLET AT BEDTIME. 28 tablet 0  . Multiple Vitamins-Minerals (CENTRUM SILVER PO) Take 1 tablet by mouth daily.    . Multiple Vitamins-Minerals (ZINC PO) Take 1 tablet by mouth daily.    [name] omeprazole (PRILOSEC) 20 MG capsule Take 1 capsule (20 mg total) by mouth 2 (two) times daily before a meal. Take 30- 60 min before your first and last meals of the day 180 capsule 1  . Polyethyl Glycol-Propyl Glycol (SYSTANE) 0.4-0.3 % SOLN Place 1 drop into both eyes daily as needed (for dry eyes).     . potassium chloride SA (KLOR-CON) 20 MEQ tablet Take 1 tablet (20 mEq total) by mouth daily. 30 tablet 6  . rosuvastatin (CRESTOR) 40 MG tablet Take 1 tablet (40 mg total) by mouth daily. 30 tablet 6  . senna (SENOKOT) 8.6 MG tablet Take 1 tablet by mouth 2 (two) times daily.     No current facility-administered medications on file prior to visit.    Past Medical History:  Diagnosis Date  . AAA (abdominal aortic aneurysm) ([place]) LAST ABDOMINAL US 10-20-17 3.3 CM   MONITORED BY DR [name]  . Anxiety   . Arthritis   . Atrial fibrillation ([name])   . Basal cell carcinoma   . Bilateral carotid artery stenosis DUPLEX 12-29-2012  BY DR [facility]   BILATERAL ICA STENOSIS 60-79%  . BPH (benign prostatic hypertrophy)   . Chronic diastolic heart failure ([place])   . Chronic kidney disease    stage 3, pt unaware  . Colon cancer ([place])   . Complication of anesthesia    had nausea after carotid artery surgery, states the medicine given to help the nausea, made it worse  . COPD (chronic obstructive pulmonary disease) ([place])   . Depression   . GERD (gastroesophageal reflux disease)    . Glaucoma BOTH EYES   Dr Gershon Crane  . History of basal cell carcinoma excision   . History of lung cancer APRIL 2012  SQUAMOUS CELL---- S/P RIGHT LOWER LOBECTOMY AT [facility] --  NO CHEMORADIATION---  NO RECURRENCE    ONCOLOGIST- DR Tressie Stalker  LOV [facility] 10-27-2012  . History of pneumothorax    pt unaware  . Hx of adenomatous colonic polyps 2005    X 2; 1 hyperplastic polyp; Dr Olevia Perches  . Hyperlipidemia   . Hypertension   . Impaired fasting glucose 2007   108; A1c5.4%  .  Lesion of bladder   . Lung cancer ([place]) 01/25/2012  . Microhematuria   . OSA (obstructive sleep apnea) 08/29/2015   CPAP SET ON 10  . PAD (peripheral artery disease) ([place]) ABI'S  JAN 2014  0.65 ON RIGHT ;  1.04 ON LEFT  . Peripheral vascular disease ([place]) S/P ANGIOPLASTY AND STENTING   FOLLOWED  BY DR [name]  . Pleural effusion on right 11/18/2018   Chronic, noted on CXR  . Spinal stenosis Sept. 2015  . Status post placement of implantable loop recorder   . Stroke [facility]) Jul 07, 2013   mini  TIA  . Thoracic aorta atherosclerosis ([place])   . Thyrotoxicosis    amiodarone induced  . Urgency of urination     Past Surgical History:  Procedure Laterality Date  . ANGIO PLASTY     X 4 in legs  . AORTOGRAM  07-27-2002   MILD DIFFUSE ILIAC ARTERY OCCLUSIVE DISEASE /  LEFT RENAL ARTERY 20%/ PATENT LEFT FEM-POP GRAFT/ MILD SFA AND POPLITEAL ARTERY OCCLUSIVE DISEASE W/ SEVERE KIDNEY OCCLUSIVE DISEASE  . ATRIAL FIBRILLATION ABLATION N/A 04/09/2019   Procedure: ATRIAL FIBRILLATION ABLATION;  Surgeon: Thompson Grayer, MD;  Location: [place] CV LAB;  Service: Cardiovascular;  Laterality: N/A;  . ATRIAL FIBRILLATION ABLATION N/A 02/12/2020   Procedure: ATRIAL FIBRILLATION ABLATION;  Surgeon: Thompson Grayer, MD;  Location: [place] CV LAB;  Service: Cardiovascular;  Laterality: N/A;  . BASAL CELL CARCINOMA EXCISION     MULTIPLE TIMES--  RIGHT FOREARM, CHEEKS, AND BACK  . BIOPSY  06/25/2018   Procedure: BIOPSY;  Surgeon:  Rush Landmark Telford Nab., MD;  Location: [place];  Service: Gastroenterology;;  . CARDIOVASCULAR STRESS TEST  12-08-2012  DR MCLEAN   NORMAL LEXISCAN WITH NO EXERCISE NUCLEAR STUDY/ EF 66%/   NO ISCHEMIA/ NO SIGNIFICANT CHANGE FROM PRIOR STUDY  . CARDIOVERSION N/A 02/14/2018   Procedure: CARDIOVERSION;  Surgeon: Larey Dresser, MD;  Location: [facility] ENDOSCOPY;  Service: Cardiovascular;  Laterality: N/A;  . CAROTID ANGIOGRAM N/A 07/10/2013   Procedure: CAROTID ANGIOGRAM;  Surgeon: Elam Dutch, MD;  Location: [facility] CATH LAB;  Service: Cardiovascular;  Laterality: N/A;  . CAROTID ENDARTERECTOMY Right 07-14-13   cea  . CATARACT EXTRACTION W/ INTRAOCULAR LENS  IMPLANT, BILATERAL    . colon polyectomy    . COLONOSCOPY WITH PROPOFOL N/A 06/25/2018   Procedure: COLONOSCOPY WITH PROPOFOL;  Surgeon: Rush Landmark Telford Nab., MD;  Location: [place];  Service: Gastroenterology;  Laterality: N/A;  . CYSTOSCOPY W/ RETROGRADES Bilateral 01/21/2013   Procedure: CYSTOSCOPY WITH RETROGRADE PYELOGRAM;  Surgeon: Molli Hazard, MD;  [facility];  Service: Urology;  Laterality: Bilateral;   CYSTO, BLADDER BIOPSY, BILATERAL RETROGRADE PYELOGRAM  RAD TECH FROM RADIOLOGY PER JOY  . CYSTOSCOPY WITH BIOPSY N/A 01/21/2013   Procedure: CYSTOSCOPY WITH BIOPSY;  Surgeon: Molli Hazard, MD;  Location: [facility];  Service: Urology;  Laterality: N/A;  . ENDARTERECTOMY Right 07/14/2013   Procedure: ENDARTERECTOMY CAROTID;  Surgeon: Angelia Mould, MD;  Location: [place];  Service: Vascular;  Laterality: Right;  . EP IMPLANTABLE DEVICE N/A 08/12/2015   Procedure: Loop Recorder Insertion;  Surgeon: Thompson Grayer, MD;  Location: [place] CV LAB;  Service: Cardiovascular;  Laterality: N/A;  . ESOPHAGOGASTRODUODENOSCOPY (EGD) WITH PROPOFOL N/A 06/25/2018   Procedure: ESOPHAGOGASTRODUODENOSCOPY (EGD) WITH PROPOFOL;  Surgeon: Rush Landmark Telford Nab., MD;  Location:  [place];  Service: Gastroenterology;  Laterality: N/A;  . ESOPHAGOGASTRODUODENOSCOPY (EGD) WITH PROPOFOL N/A 07/10/2018  Procedure: ESOPHAGOGASTRODUODENOSCOPY (EGD) WITH PROPOFOL;  Surgeon: Milus Banister, MD;  Location: [facility] ENDOSCOPY;  Service: Endoscopy;  Laterality: N/A;  . EUS N/A 07/10/2018   Procedure: UPPER ENDOSCOPIC ULTRASOUND (EUS) RADIAL;  Surgeon: Milus Banister, MD;  Location: [facility] ENDOSCOPY;  Service: Endoscopy;  Laterality: N/A;  . EYE SURGERY Right   . FEMORAL-POPLITEAL BYPASS GRAFT Left [facility]   AND 2001  IN [place]  . FEMORAL-POPLITEAL BYPASS GRAFT Left 08/30/2015   Procedure: REVISION OF BYPASS GRAFT Left  FEMORAL-POPLITEAL ARTERY;  Surgeon: Angelia Mould, MD;  Location: [place];  Service: Vascular;  Laterality: Left;  . FINE NEEDLE ASPIRATION N/A 07/10/2018   Procedure: FINE NEEDLE ASPIRATION (FNA) LINEAR;  Surgeon: Milus Banister, MD;  Location: [facility] ENDOSCOPY;  Service: Endoscopy;  Laterality: N/A;  . FLEXIBLE SIGMOIDOSCOPY N/A 12/12/2018   Procedure: FLEXIBLE SIGMOIDOSCOPY;  Surgeon: Ileana Roup, MD;  Location: [facility] ORS;  Service: General;  Laterality: N/A;  . LARYNGOSCOPY  06-27-2004   BX VOCAL CORD  (LEUKOPLAKIA)  PER PT NO ISSUES SINCE  . LEFT HEART CATH AND CORONARY ANGIOGRAPHY N/A 08/20/2017   Procedure: LEFT HEART CATH AND CORONARY ANGIOGRAPHY;  Surgeon: Larey Dresser, MD;  Location: [place] CV LAB;  Service: Cardiovascular;  Laterality: N/A;  . LOOP RECORDER INSERTION N/A 04/09/2019   Procedure: LOOP RECORDER INSERTION;  Surgeon: Thompson Grayer, MD;  Location: [place] CV LAB;  Service: Cardiovascular;  Laterality: N/A;  . LOOP RECORDER REMOVAL N/A 04/09/2019   Procedure: LOOP RECORDER REMOVAL;  Surgeon: Thompson Grayer, MD;  Location: [place] CV LAB;  Service: Cardiovascular;  Laterality: N/A;  . LOWER EXTREMITY ANGIOGRAM Bilateral 08/29/2015   Procedure: Lower Extremity Angiogram;  Surgeon: Angelia Mould, MD;   Location: [place] CV LAB;  Service: Cardiovascular;  Laterality: Bilateral;  . LUNG LOBECTOMY  01/24/2011    RIGHT UPPER LOBE  (SQUAMOUS CELL CARCINOMA) Dr Dorthea Cove , [facility]. No chemotherapyor radiation  . PATCH ANGIOPLASTY Right 07/14/2013   Procedure: PATCH ANGIOPLASTY;  Surgeon: Angelia Mould, MD;  Location: [place];  Service: Vascular;  Laterality: Right;  . PATCH ANGIOPLASTY Left 08/30/2015   Procedure: VEIN PATCH ANGIOPLASTY OF PROXIMAL Left BYPASS GRAFT;  Surgeon: Angelia Mould, MD;  Location: [place];  Service: Vascular;  Laterality: Left;  . PERIPHERAL VASCULAR CATHETERIZATION N/A 08/29/2015   Procedure: Abdominal Aortogram;  Surgeon: Angelia Mould, MD;  Location: [place] CV LAB;  Service: Cardiovascular;  Laterality: N/A;  . POLYPECTOMY  06/25/2018   Procedure: POLYPECTOMY;  Surgeon: Rush Landmark Telford Nab., MD;  Location: [place];  Service: Gastroenterology;;  . Lia Foyer INJECTION  06/25/2018   Procedure: SUBMUCOSAL INJECTION;  Surgeon: Irving Copas., MD;  Location: [place];  Service: Gastroenterology;;  . TEE WITHOUT CARDIOVERSION N/A 02/14/2018   Procedure: TRANSESOPHAGEAL ECHOCARDIOGRAM (TEE);  Surgeon: Larey Dresser, MD;  Location: [facility] ENDOSCOPY;  Service: Cardiovascular;  Laterality: N/A;  . trabecular surgery     OS  . TRANSTHORACIC ECHOCARDIOGRAM  12-29-2012  DR [facility]   MILD LVH/  LVSF NORMAL/ EF 38-10%/  GRADE I DIASTOLIC DYSFUNCTION    Social History   Socioeconomic History  . Marital status: Married    Spouse name: [name]   . Number of children: 3  . Years of education: 12+  . Highest education level: Not on file  Occupational History  . 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 Smoking status: Former Smoker    Packs/day: 0.50    Years: 50.00    Pack years: 25.00    Types: Cigarettes     Quit date: 07/28/2014    Years since quitting: 5.7  . Smokeless tobacco: Never Used  Vaping Use  . Vaping Use: Never used  Substance and Sexual Activity  . Alcohol use: Yes    Alcohol/week: 4.0 standard drinks    Types: 1 Glasses of wine, 1 Cans of beer, 1 Shots of liquor, 1 Standard drinks or equivalent per week    Comment:  socially, variable  . Drug use: No  . Sexual activity: Not on file  Other Topics Concern  . Not on file  Social History Narrative   Patient lives at home with spouse [name]   Patient has 3 children    Patient is right handed    Social Determinants of Health   Financial Resource Strain:   . Difficulty of Paying Living Expenses:   Food Insecurity:   . Worried About Charity fundraiser in the Last Year:   . Arboriculturist in the Last Year:   Transportation Needs:   . Film/video editor (Medical):   [name] Lack of Transportation (Non-Medical):   Physical Activity:   . Days of Exercise per Week:   . Minutes of Exercise per Session:   Stress:   . Feeling of Stress :   Social Connections:   . Frequency of Communication with Friends and Family:   . Frequency of Social Gatherings with Friends and Family:   . Attends Religious Services:   . Active Member of Clubs or Organizations:   . Attends Archivist Meetings:   [name] Marital Status:     Family History  Problem Relation Age of Onset  . Stroke Mother        mini strokes  . Alcohol abuse Father   . Heart disease Father        MI after 83  . Stroke Father   . Hypertension Father   . Heart attack Father   . Heart disease Paternal Aunt        several  . Hypertension Paternal Aunt        several  . Stroke Paternal Aunt        several  . Stroke Paternal Uncle        several  . Heart disease Paternal Uncle        several;2 had MI pre 74  . Cancer Daughter 39       breast ca, also with benign sessile polyp     Review of Systems  Constitutional: Positive for fatigue.  Respiratory: Positive  for cough (a little ) and shortness of breath (improved). Negative for wheezing.   Cardiovascular: Negative for chest pain, palpitations and leg swelling.  Gastrointestinal:       No gerd  Psychiatric/Behavioral: Positive for dysphoric mood. Negative for sleep disturbance. The patient is not nervous/anxious.        Feels irritable at times, less tolerant       Objective:   Vitals:   05/03/20 1139  BP: 110/60  Pulse: (!) 55  Temp: 98.1 F (36.7 C)  SpO2: 95%   BP Readings from Last 3 Encounters:  05/03/20 110/60  04/27/20 120/68  04/21/20 (!) 110/60   Wt Readings from Last 3 Encounters:  05/03/20 190 lb 9.6 oz (86.5 kg)  04/27/20 193 lb (87.5 kg)  04/21/20 198 lb 9.6 oz (90.1 kg)   Body mass index is 25.15 kg/m.   Physical Exam    Constitutional: Appears well-developed and well-nourished. No distress.  HENT:  Head: Normocephalic and atraumatic.  Neck: Neck supple. No tracheal deviation present. No thyromegaly present.  No cervical lymphadenopathy Cardiovascular: Normal rate, regular rhythm and normal heart sounds.   No murmur heard. No carotid bruit .  No edema Pulmonary/Chest: Effort normal and breath sounds normal. No respiratory distress. No has no wheezes. No rales.  Skin: Skin is warm and dry. Not diaphoretic.  Psychiatric: depressed mood and affect. Behavior is normal.      Assessment & Plan:    See Problem List for Assessment and Plan of chronic medical problems.    This visit occurred during the SARS-CoV-2 public health emergency.  Safety protocols were in place, including screening questions prior to the visit, additional usage of staff PPE, and extensive cleaning of exam room while observing appropriate contact time as indicated for disinfecting solutions.

## 2020-05-02 NOTE — Patient Instructions (Addendum)
  Medications reviewed and updated.  Changes include :   Stop the montelukast at night and see if you really need it.  Decreased fluoxetine to 20 mg x 2 weeks.   Start lexparo 10 mg daily after stopping the fluoxetine.   Your prescription(s) have been submitted to your pharmacy. Please take as directed and contact our office if you believe you are having problem(s) with the medication(s).  Try miralax for the constipation.    Please followup in 3 months, sooner if needed.

## 2020-05-03 ENCOUNTER — Encounter: Payer: Self-pay | Admitting: Internal Medicine

## 2020-05-03 ENCOUNTER — Ambulatory Visit (INDEPENDENT_AMBULATORY_CARE_PROVIDER_SITE_OTHER): Payer: PPO | Admitting: Internal Medicine

## 2020-05-03 ENCOUNTER — Telehealth (HOSPITAL_COMMUNITY): Payer: Self-pay

## 2020-05-03 ENCOUNTER — Other Ambulatory Visit: Payer: Self-pay

## 2020-05-03 VITALS — BP 110/60 | HR 55 | Temp 98.1°F | Ht 73.0 in | Wt 190.6 lb

## 2020-05-03 DIAGNOSIS — I10 Essential (primary) hypertension: Secondary | ICD-10-CM | POA: Diagnosis not present

## 2020-05-03 DIAGNOSIS — G4733 Obstructive sleep apnea (adult) (pediatric): Secondary | ICD-10-CM | POA: Diagnosis not present

## 2020-05-03 DIAGNOSIS — I480 Paroxysmal atrial fibrillation: Secondary | ICD-10-CM

## 2020-05-03 DIAGNOSIS — R7303 Prediabetes: Secondary | ICD-10-CM

## 2020-05-03 DIAGNOSIS — F3289 Other specified depressive episodes: Secondary | ICD-10-CM | POA: Diagnosis not present

## 2020-05-03 DIAGNOSIS — K922 Gastrointestinal hemorrhage, unspecified: Secondary | ICD-10-CM | POA: Diagnosis not present

## 2020-05-03 DIAGNOSIS — E782 Mixed hyperlipidemia: Secondary | ICD-10-CM

## 2020-05-03 MED ORDER — ESCITALOPRAM OXALATE 10 MG PO TABS: 10.0000 mg | ORAL_TABLET | Freq: Every day | ORAL | 5 refills | Status: DC

## 2020-05-03 MED ORDER — FLUOXETINE HCL 20 MG PO TABS
20.0000 mg | ORAL_TABLET | Freq: Every day | ORAL | 0 refills | Status: DC
Start: 2020-05-03 — End: 2020-08-01

## 2020-05-03 MED ORDER — OMEPRAZOLE 20 MG PO CPDR: 20.0000 mg | DELAYED_RELEASE_CAPSULE | Freq: Two times a day (BID) | ORAL | 1 refills | Status: DC | PRN

## 2020-05-03 NOTE — Assessment & Plan Note (Signed)
Chronic Not controlled Will taper off prozac and start lexapro prozac 20 mg daily x 2 weeks Then start lexapro 10 mg daily F/u in 3 months

## 2020-05-03 NOTE — Assessment & Plan Note (Signed)
Chronic BP well controlled Current regimen effective and well tolerated Continue current medications at current doses  

## 2020-05-03 NOTE — Assessment & Plan Note (Signed)
Denies GERD taking omeprazole, pepcid prn Continue prn medication

## 2020-05-03 NOTE — Telephone Encounter (Signed)
Called pt today to sch home visit, he states he is feeling better and he seen his PCP today. He does not feel he needs a home visit this week, or really anymore visits at this time. I did get him to agree with a quick visit next week to check in and we will go from there.   Marylouise Stacks, EMT-Paramedic  05/03/20

## 2020-05-06 ENCOUNTER — Other Ambulatory Visit: Payer: Self-pay | Admitting: Internal Medicine

## 2020-05-09 ENCOUNTER — Ambulatory Visit (HOSPITAL_COMMUNITY)
Admission: RE | Admit: 2020-05-09 | Discharge: 2020-05-09 | Disposition: A | Discharge status: Home / Self Care | Payer: PPO | Source: Ambulatory Visit | Attending: Cardiology | Admitting: Cardiology

## 2020-05-09 ENCOUNTER — Other Ambulatory Visit (HOSPITAL_COMMUNITY): Payer: Self-pay

## 2020-05-09 ENCOUNTER — Encounter (HOSPITAL_COMMUNITY): Payer: Self-pay | Admitting: Cardiology

## 2020-05-09 ENCOUNTER — Other Ambulatory Visit: Payer: Self-pay

## 2020-05-09 VITALS — BP 108/60 | HR 55 | Ht 73.0 in | Wt 191.4 lb

## 2020-05-09 DIAGNOSIS — Z87891 Personal history of nicotine dependence: Secondary | ICD-10-CM | POA: Diagnosis not present

## 2020-05-09 DIAGNOSIS — Z85038 Personal history of other malignant neoplasm of large intestine: Secondary | ICD-10-CM | POA: Diagnosis not present

## 2020-05-09 DIAGNOSIS — H409 Unspecified glaucoma: Secondary | ICD-10-CM | POA: Insufficient documentation

## 2020-05-09 DIAGNOSIS — Z85118 Personal history of other malignant neoplasm of bronchus and lung: Secondary | ICD-10-CM | POA: Diagnosis not present

## 2020-05-09 DIAGNOSIS — Z7901 Long term (current) use of anticoagulants: Secondary | ICD-10-CM | POA: Diagnosis not present

## 2020-05-09 DIAGNOSIS — N183 Chronic kidney disease, stage 3 unspecified: Secondary | ICD-10-CM | POA: Insufficient documentation

## 2020-05-09 DIAGNOSIS — J449 Chronic obstructive pulmonary disease, unspecified: Secondary | ICD-10-CM | POA: Diagnosis not present

## 2020-05-09 DIAGNOSIS — I2510 Atherosclerotic heart disease of native coronary artery without angina pectoris: Secondary | ICD-10-CM | POA: Diagnosis not present

## 2020-05-09 DIAGNOSIS — E0590 Thyrotoxicosis, unspecified without thyrotoxic crisis or storm: Secondary | ICD-10-CM | POA: Diagnosis not present

## 2020-05-09 DIAGNOSIS — E785 Hyperlipidemia, unspecified: Secondary | ICD-10-CM | POA: Diagnosis not present

## 2020-05-09 DIAGNOSIS — G4733 Obstructive sleep apnea (adult) (pediatric): Secondary | ICD-10-CM | POA: Insufficient documentation

## 2020-05-09 DIAGNOSIS — Z8673 Personal history of transient ischemic attack (TIA), and cerebral infarction without residual deficits: Secondary | ICD-10-CM | POA: Insufficient documentation

## 2020-05-09 DIAGNOSIS — I480 Paroxysmal atrial fibrillation: Secondary | ICD-10-CM

## 2020-05-09 DIAGNOSIS — Z95820 Peripheral vascular angioplasty status with implants and grafts: Secondary | ICD-10-CM | POA: Diagnosis not present

## 2020-05-09 DIAGNOSIS — J90 Pleural effusion, not elsewhere classified: Secondary | ICD-10-CM | POA: Diagnosis not present

## 2020-05-09 DIAGNOSIS — I130 Hypertensive heart and chronic kidney disease with heart failure and stage 1 through stage 4 chronic kidney disease, or unspecified chronic kidney disease: Secondary | ICD-10-CM | POA: Diagnosis not present

## 2020-05-09 DIAGNOSIS — I714 Abdominal aortic aneurysm, without rupture: Secondary | ICD-10-CM | POA: Insufficient documentation

## 2020-05-09 DIAGNOSIS — Z7951 Long term (current) use of inhaled steroids: Secondary | ICD-10-CM | POA: Diagnosis not present

## 2020-05-09 DIAGNOSIS — Z902 Acquired absence of lung [part of]: Secondary | ICD-10-CM | POA: Insufficient documentation

## 2020-05-09 DIAGNOSIS — I739 Peripheral vascular disease, unspecified: Secondary | ICD-10-CM | POA: Insufficient documentation

## 2020-05-09 DIAGNOSIS — Z79899 Other long term (current) drug therapy: Secondary | ICD-10-CM | POA: Diagnosis not present

## 2020-05-09 DIAGNOSIS — I5032 Chronic diastolic (congestive) heart failure: Secondary | ICD-10-CM | POA: Insufficient documentation

## 2020-05-09 DIAGNOSIS — I6521 Occlusion and stenosis of right carotid artery: Secondary | ICD-10-CM | POA: Diagnosis not present

## 2020-05-09 LAB — BASIC METABOLIC PANEL
Anion gap: 10 (ref 5–15)
BUN: 16 mg/dL (ref 8–23)
CO2: 25 mmol/L (ref 22–32)
Calcium: 9.3 mg/dL (ref 8.9–10.3)
Chloride: 103 mmol/L (ref 98–111)
Creatinine, Ser: 1.17 mg/dL (ref 0.61–1.24)
GFR calc Af Amer: >60 mL/min (ref >60)
GFR calc non Af Amer: 59 mL/min — ABNORMAL LOW (ref >60)
Glucose, Bld: 176 mg/dL — ABNORMAL HIGH (ref 70–99)
Potassium: 4.5 mmol/L (ref 3.5–5.1)
Sodium: 138 mmol/L (ref 135–145)

## 2020-05-09 MED ORDER — FUROSEMIDE 40 MG PO TABS: 40.0000 mg | ORAL_TABLET | Freq: Two times a day (BID) | ORAL | 3 refills | Status: DC

## 2020-05-09 NOTE — Patient Instructions (Signed)
Labs done today, your results will be available in MyChart, we will contact you for abnormal readings.  Your physician recommends that you schedule a follow-up appointment in:  3 months  If you have any questions or concerns before your next appointment please send us a message through mychart or call our office at [phone].    TO LEAVE A MESSAGE FOR THE NURSE SELECT OPTION 2, PLEASE LEAVE A MESSAGE INCLUDING: . YOUR NAME . DATE OF BIRTH . CALL BACK NUMBER . REASON FOR CALL**this is important as we prioritize the call backs  YOU WILL RECEIVE A CALL BACK THE SAME DAY AS LONG AS YOU CALL BEFORE 4:00 PM  At the [facility], you and your health needs are our priority. As part of our continuing mission to provide you with exceptional heart care, we have created designated Provider Care Teams. These Care Teams include your primary Cardiologist (physician) and Advanced Practice Providers (APPs- Physician Assistants and Nurse Practitioners) who all work together to provide you with the care you need, when you need it.   You may see any of the following providers on your designated Care Team at your next follow up: . Dr Daniel Bensimhon . Dr Dalton McLean . Amy Clegg, NP . Brittainy Simmons, PA . Lauren Kemp, PharmD   Please be sure to bring in all your medications bottles to every appointment.     

## 2020-05-09 NOTE — Progress Notes (Signed)
Patient ID: Troy Wilemon [name], male   DOB: September 04, 1941, 79 y.o.   MRN: [id number] PCP: Dr. Quay Burow Cardiology: [name]  79 y.o. with history of HTN, COPD, active smoking/COPD, carotid stenosis s/p right CEA, paroxysmal atrial fibrillation, and PAD returns for followup of CHF and atrial fibrillation.  He does not have known obstructive CAD but is at high risk for CAD based on his comorbidities.  Lexiscan Cardiolite in 3/14 showed no ischemia or infarction and echo in 3/14 showed normal EF.  He had a left fem-pop bypass at the [facility] in the '90s.  He is followed at [facility] for PAD. He has chronic right calf/thigh/buttocks claudication that is unchanged over the last few years and follows regularly at [facility].  He has had right lower lobectomy for lung cancer.  He continues to stay off cigarettes.    At a prior appointment, he was in atrial fibrillation but did not realize it.  I started him on Eliquis and diltiazem CD, and he spontaneously converted to NSR.  In 5/15, he was at [facility] and felt "strange" one day: fatigued, weak, short of breath.  He went to the ER and was in atrial fibrillation with HR in 80s-90s.  He spontaneously converted to NSR in the ER.  He felt back to normal after converting to NSR.  I started him on Multaq 400 mg bid.  With CHF, he was eventually transitioned over to amiodarone.   He had patch angioplasty revision of left fem-pop bypass in 11/16.  Now with minimal claudication.  He follows with [facility].     He had a Cardiolite and echo in 7/17 that were unremarkable.  Repeat Cardiolite in 12/17 showed no ischemia, echo was uninterpretable.  Cardiac MRI was therefore done in 1/18, showing EF 66% with normal-appearing RV, no late gadolinium enhancement.   Given increased exertional dyspnea and a defect on Cardiolite, he had LHC in 11/18.  This showed no obstructive CAD. He had a chest CT done given concern for possible amiodarone lung toxicity with increased dyspnea. This showed  emphysema, no ILD.    In 5/19, he developed a probable viral syndrome with dehydration and had a presyncopal episode.  He was orthostatic in the hospital.  He was noted to be in atrial fibrillation this admission which remained persistent.  He had a TEE-guided DCCV back to NSR.   In 9/19, he was admitted with GI bleeding and found to have a sigmoid mass on colonoscopy, path showed sigmoid adenocarcinoma.  He was noted to have right paratracheal lymphadenopathy but EUS with biopsy in 10/19 did not show malignancy.  He had surgical resection of colon cancer.   Amiodarone was stopped with elevated ESR and increased dyspnea.  He was also found to have hyperthyroidism. He was treated with methimazole by endocrinology.    He had atrial fibrillation ablation in 7/20.   Echo in 4/21 showed EF 60-65%, normal RV.    In 5/21, due to high atrial fibrillation burden, he had redo atrial fibrillation ablation.   Cardiolite in 6/21 showed EF 70%, no ischemia/infarction.   He returns today for followup of diastolic CHF and atrial fibrillation.  At last appointment, he was volume overloaded and short of breath.  I had him increase Lasix to 40 mg bid.  His weight is down 7 lbs.  His breathing is better, no dyspnea walking in the house and does ok walking slowly on flat ground.  He is working with a Clinical research associate three days/week.  No chest pain.  No lightheadedness.  No significant claudication.  He is off losartan with elevated creatinine.   ECG (personally reviewed): NSR, rate 56  Labs (3/13): LDL 75, HDL 35, K 4.1, creatinine 1.1 Labs (3/14): LDL 91, HDL 27 Labs (10/14): K 4.3, creatinine 0.98 Labs (4/15): K 4.9, creatinine 1.1, LDL 76, HDL 30 Labs (5/15): K 4.3, creatinine 1.7, BNP 251 Labs (6/15): K 4.4, creatinine 1.3 Labs (10/15): TSH normal Labs (5/16): K 4.2, creatinine 1.12, HCT 39.5, LFTs normal, LDL 84, HDL 43, TSH normal Labs (11/16): creatinine 0.94 Labs (4/17): LDL 39, HDL 39 Labs (6/17): K 4.5,  creatinine 1.2 Labs (9/17): K 4, creatinine 1.25, HCT 38.7 Labs (12/17): K 4.5, creatinine 1.49 => 1.62, BNP 43 Labs (2/18): K 3.8, creatinine 1.33, LFTs normal, TSH normal Labs (6/18): K 4.3, creatinine 1.49, LFTs normal, TSH normal, hgb 12.3 Labs (11/18): ESR 60, TSH normal, LFTs normal, creatinine 1.34 Labs (1/19): LDL 50 Labs (5/19): K 4.6, creatinine 1.55, LFTs normal, hgb 12.7, LFTs normal Labs (10/19): K 4 => 4.1, creatinine 1.39 => 1.43, BNP 279, hgb 10.7, TSH low, free T4 and free T3 low, ESR 108 Labs (3/21): K 4.5, creatinine 1.19, TSH normal Labs (4/21): K 4.8, creatinine 1.28, BNP 524, LDL 59 Labs (7/21): hgb 11.5, creatinine 1.4  PMH: 1. HTN: Fatigue and cough with ramipril use.  2. COPD: Quit smoking 2014. PFTs (1/18) with moderately severe COPD.  - CT chest (11/18): Emphysema noted, no ILD.  3. AAA: CT 1/13 with 3.0 cm AAA.  Abdominal US (1/14) with 3.4 cm AAA. Abdominal US (4/15) with 3.25 x 3.27 AAA. Abdominal US (3/16) with 3.7 cm AAA.  Followed at [facility].  - Abd [name] (1/19): 3.3 cm AAA. 4. Squamous cell lung cancer diagnosed 2/12.  Had right lower lobectomy in 4/12.  5. Hyperlipidemia 6. PAD: Left fem-pop bypass 1994.  ABIs (2/12) 0.62 on right, 0.95 on left. ABIs (1/14): 0.65 on right, 1.04 on left. ABIs (4/15) 0.66 right, 0.87 left.  ABIs (3/16) 0.6 right, 0.88 left.  Patch angioplasty left fem-pop bypass in 11/16.   - Left fem-pop bypass patent on doppler evaluation in 12/17.  - ABIs (2/21): normal on left (1.02), moderately decreased on right (0.54).  7. CAD: Stress myoview 2004 was normal. Lexiscan Cardiolite (3/14) with EF 66%, no ischemia or infarction. Lexiscan Cardiolite (7/17) with EF 61%, no ischemia/infarction.  - Cardiolite (12/17) with EF 62%, inferior/inferolateral fixed defect, most likely diaphragmatic attenuation, no ischemia.  - Cardiolite (9/18) with EF > 65%, fixed inferior defect (attenuation versus infarction), no ischemia.  - LHC (11/18): No  obstructive CAD.  - Cardiolite (6/21): EF 70%, no ischemia/infarction 8. Chronic diastolic CHF: Echo (3/54): technically difficult with EF 55-60%, upper normal RV size.  Echo (5/16) with EF 60-65%.  Echo (7/17) with EF 55-60%, normal RV size and systolic function.  - Echo 12/17 with very poor windows, unable to comment on LV or RV function.  - Cardiac MRI (1/18) with EF 66%, normal RV size and systolic function.  - TEE (5/19): EF 55-60%, normal RV size and systolic function.  - Echo (4/21): EF 60-65%, normal RV 9. Carotid stenosis: TIA 10/14.  Carotid dopplers with > 65% RICA, 68-12% LICA.  Patient had right CEA in 10/14. Carotids (4/15) patent right CEA, LICA 75-17% stenosis.  Carotids (00/17) < 49% LICA, patent right CEA.  - Carotid dopplers (12/17): right CEA ok, < 44% LICA stenosis.  - Carotid dopplers (1/19): Right CEA ok,  2-03% LICA stenosis.  - Carotid dopplers (2/21): Right CEA ok, LICA 55-97% stenosis.  10. Atrial fibrillation: Paroxysmal. First noted after lobectomy in 4/12 (brief), recurrence in 4/15 then in 5/15.  - TEE-guided DCCV for persistent atrial fibrillation in 5/19.  - Atrial fibrillation ablation 7/20.  - Re-do atrial fibrillation ablation 5/21.  11. Spinal stenosis 12. OSA: Using CPAP.  13. Glaucoma 14. Has ILR 15. Colon cancer: Sigmoid adenocarcinoma, s/p surgical resection.  16. 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   SH: Married, lives in [place].  Former [employer].  Quit smoking in 2014.  Rare ETOH now.   FH: No premature CAD  ROS: All systems reviewed and negative except as per HPI.   Current Outpatient Medications  Medication Sig Dispense Refill  . amLODipine (NORVASC) 5 MG tablet Take 1 tablet (5 mg total) by mouth daily. 30 tablet 6  . apixaban (ELIQUIS) 5 MG TABS tablet Take 1 tablet (5 mg total) by mouth 2 (two) times daily. 60 tablet 6  . Ascorbic Acid (VITAMIN C PO) Take 500 mg by mouth in the morning and at bedtime.      . Biotin 2500 MCG CAPS Take 2,500 mcg by mouth daily.    . Brinzolamide-Brimonidine (SIMBRINZA) 1-0.2 % SUSP Place 1 drop into both eyes 2 times daily.    . budesonide-formoterol (SYMBICORT) 160-4.5 MCG/ACT inhaler Inhale 2 puffs into the lungs 2 (two) times daily.    [name] docusate sodium (COLACE) 100 MG capsule Take 100 mg by mouth 2 (two) times daily.     [name] escitalopram (LEXAPRO) 10 MG tablet Take 1 tablet (10 mg total) by mouth daily. 30 tablet 5  . ferrous sulfate 325 (65 FE) MG tablet Take 325 mg by mouth 2 (two) times daily with a meal.     . FLUoxetine (PROZAC) 20 MG tablet Take 1 tablet (20 mg total) by mouth daily. 14 tablet 0  . losartan (COZAAR) 25 MG tablet TAKE (2) TABLETS BY MOUTH DAILY. 60 tablet 5  . Magnesium 500 MG CAPS Take 500 mg by mouth daily.     . Melatonin 5 MG TABS Take 5 mg by mouth at bedtime.    . metoprolol succinate (TOPROL-XL) 25 MG 24 hr tablet Take 0.5 tablets (12.5 mg total) by mouth at bedtime. 15 tablet 6  . montelukast (SINGULAIR) 10 MG tablet TAKE ONE TABLET AT BEDTIME. 30 tablet 5  . Multiple Vitamins-Minerals (CENTRUM SILVER PO) Take 1 tablet by mouth daily.    . Multiple Vitamins-Minerals (ZINC PO) Take 1 tablet by mouth daily.    [name] omeprazole (PRILOSEC) 20 MG capsule Take 1 capsule (20 mg total) by mouth 2 (two) times daily as needed. Take 30- 60 min before your first and last meals of the day 180 capsule 1  . Polyethyl Glycol-Propyl Glycol (SYSTANE) 0.4-0.3 % SOLN Place 1 drop into both eyes daily as needed (for dry eyes).     . potassium chloride SA (KLOR-CON) 20 MEQ tablet Take 1 tablet (20 mEq total) by mouth daily. 30 tablet 6  . rosuvastatin (CRESTOR) 40 MG tablet Take 1 tablet (40 mg total) by mouth daily. 30 tablet 6  . senna (SENOKOT) 8.6 MG tablet Take 1 tablet by mouth 2 (two) times daily.    . furosemide (LASIX) 40 MG tablet Take 1 tablet (40 mg total) by mouth 2 (two) times daily. 60 tablet 3   No current facility-administered medications  for this encounter.    BP 108/60  Pulse (!) 55   Ht _0  (1.854 m)   Wt 86.8 kg (191 lb 6.4 oz)   SpO2 96%   BMI 25.25 kg/m  General: NAD Neck: No JVD, no thyromegaly or thyroid nodule.  Lungs: Clear to auscultation bilaterally with normal respiratory effort. CV: Nondisplaced PMI.  Heart regular S1/S2, no S3/S4, no murmur.  No peripheral edema.  No carotid bruit. Difficult to palpate pedal pulses.  Abdomen: Soft, nontender, no hepatosplenomegaly, no distention.  Skin: Intact without lesions or rashes.  Neurologic: Alert and oriented x 3.  Psych: Normal affect. Extremities: No clubbing or cyanosis.  HEENT: Normal.   Assessment/Plan: 1. Chronic diastolic CHF: Echo in 3/15 with EF 60-65%, normal RV.  NYHA class II-III symptoms, improved recently on higher Lasix with weight down.  Not volume overloaded on exam.  - Continue Lasix 40 mg bid, BMET today.    2. Hyperlipidemia: Continue Crestor.  Good lipids in 4/21.  3. Carotid stenosis: s/p R CEA.  Dopplers followed at [facility].  4. PAD: s/p patch angioplasty to left fem-pop bypass in 11/16.  Followed at [facility].  Moderately decreased ABI on right in 2/21, but no pain or pedal ulcerations.  Stable mild claudication.  5. AAA: Stable on last [name], followed at [facility].    6. COPD: No longer smoking. PFTs in 1/18 showed moderately severe obstructive defect. CT chest in 11/18 showed emphysema.   7. Atrial fibrillation: S/p atrial fibrillation ablation in 7/20 and redo in 5/21.  He has been primarily in NSR since redo ablation, NSR today.   - Continue Toprol XL 12.5 mg daily.  - Continue Eliquis 5 mg bid.  - Continue to limit ETOH.  - Continue to use CPAP.  8. CKD: Stage 3. BMET today.   9. CAD: LHC in 11/18 with nonobstructive disease only.  Cardiolite in 6/21 with no ischemia/infarction.  10. 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  He is no longer on methimazole, follows with endocrinology.     11. Colon cancer: Sigmoid  adenocarcinoma, s/p surgical resection.  12. HTN: BP is now on the lower side, he is off losartan and continues on amlodipine.  13. Pleural effusion: Chronic right pleural effusion, s/p right lower lobectomy.     Followup in 3 months.     [name] 05/09/2020

## 2020-05-10 ENCOUNTER — Telehealth: Payer: Self-pay

## 2020-05-10 NOTE — Telephone Encounter (Signed)
Pt scheduled for PV on 06/09/20@ 4:00pm- pt will be informed by nurse at pre-visit.

## 2020-05-10 NOTE — Telephone Encounter (Signed)
Request for surgical clearance:     Endoscopy Procedure  What type of surgery is being performed?     Colon/Endo    When is this surgery scheduled?     06/23/2020  What type of clearance is required ?   Pharmacy  Are there any medications that need to be held prior to surgery and how long? Eliquis x2 days prior to procedure  Practice name and name of physician performing surgery?       Gastroenterology-Dr Mansouraty   What is your office phone and fax number?      Phone- [phone]  [phone]  Anesthesia type (None, local, MAC, general) ?       MAC

## 2020-05-10 NOTE — Telephone Encounter (Signed)
Pharmacy please comment on holding Eliquis in this patient pre colonoscopy and endoscopy.  Kerin Ransom PA-C 05/10/2020 9:58 AM

## 2020-05-10 NOTE — Telephone Encounter (Signed)
   Primary Cardiologist: Dr Aundra Dubin  Chart reviewed as part of pre-operative protocol coverage. Given past medical history and time since last visit, based on ACC/AHA guidelines, Troy Bumbaugh [name] would be at acceptable risk for the planned procedure without further cardiovascular testing.   OK to hold Eliquis one day pre op (high risk). Resume as soon as safe post op.  I will route this recommendation to the requesting party via Epic fax function and remove from pre-op pool.  Please call with questions.  Kerin Ransom, PA-C 05/10/2020, 11:00 AM

## 2020-05-10 NOTE — Telephone Encounter (Signed)
Patient with diagnosis of afib on Eliquis for anticoagulation.    Procedure: colonoscopy/endoscopy Date of procedure: 06/23/20  CHADS2-VASc score of 7 (age x2, CHF, HTN, CAD/PAD, TIA), also has pre diabetes. Underwent redo afib ablation 02/12/20, procedure date is scheduled for > 3 months post ablation.  CrCl 8mL/min Platelet count 302K   Per office protocol, patient can hold Eliquis for 1 day prior to procedure due to elevated cardiac risk. Recommend resuming Eliquis as soon as safely possible.

## 2020-05-16 ENCOUNTER — Ambulatory Visit (INDEPENDENT_AMBULATORY_CARE_PROVIDER_SITE_OTHER): Payer: PPO | Admitting: Internal Medicine

## 2020-05-16 ENCOUNTER — Other Ambulatory Visit: Payer: Self-pay

## 2020-05-16 ENCOUNTER — Encounter: Payer: Self-pay | Admitting: Internal Medicine

## 2020-05-16 VITALS — BP 138/68 | HR 59 | Ht 73.5 in | Wt 191.0 lb

## 2020-05-16 DIAGNOSIS — G4733 Obstructive sleep apnea (adult) (pediatric): Secondary | ICD-10-CM

## 2020-05-16 DIAGNOSIS — I480 Paroxysmal atrial fibrillation: Secondary | ICD-10-CM

## 2020-05-16 DIAGNOSIS — I5032 Chronic diastolic (congestive) heart failure: Secondary | ICD-10-CM

## 2020-05-16 MED ORDER — FUROSEMIDE 40 MG PO TABS: 40.0000 mg | ORAL_TABLET | Freq: Every day | ORAL | 3 refills | Status: DC

## 2020-05-16 NOTE — Patient Instructions (Addendum)
Medication Instructions:  1. Reduce your Lasix 40 mg  Take 1 tablet by mouth daily  *If you need a refill on your cardiac medications before your next appointment, please call your pharmacy*  Lab Work: BMP, CBC  If you have labs (blood work) drawn today and your tests are completely normal, you will receive your results only by:  [place] (if you have MyChart) OR  A paper copy in the mail If you have any lab test that is abnormal or we need to change your treatment, we will call you to review the results.  Testing/Procedures: None ordered.  Follow-Up: At [facility], you and your health needs are our priority.  As part of our continuing mission to provide you with exceptional heart care, we have created designated Provider Care Teams.  These Care Teams include your primary Cardiologist (physician) and Advanced Practice Providers (APPs -  Physician Assistants and Nurse Practitioners) who all work together to provide you with the care you need, when you need it.  We recommend signing up for the patient portal called "MyChart".  Sign up information is provided on this After Visit Summary.  MyChart is used to connect with patients for Virtual Visits (Telemedicine).  Patients are able to view lab/test results, encounter notes, upcoming appointments, etc.  Non-urgent messages can be sent to your provider as well.   To learn more about what you can do with MyChart, go to [email or web address].    Your next appointment:   Your physician wants you to follow-up in: 08/17/20 at 2:30 at the [address] office with [name].   Remote monitoring is used to monitor your Pacemaker from home. This monitoring reduces the number of office visits required to check your device to one time per year. It allows [name] to keep an eye on the functioning of your device to ensure it is working properly. You are scheduled for a device check from home on 05/23/20. You may send your transmission at any time that  day. If you have a wireless device, the transmission will be sent automatically. After your physician reviews your transmission, you will receive a postcard with your next transmission date.  Other Instructions:

## 2020-05-16 NOTE — Progress Notes (Signed)
PCP: Binnie Rail, MD Primary Cardiologist: Dr Evans Lance [name] is a 79 y.o. male who presents today for routine electrophysiology followup.  Since his recent afib ablation, the patient reports doing very well.  he denies procedure related complications and is pleased with the results of the procedure.  He recently had his lasix increased by Dr Aundra Dubin due to CHF.  His symptoms improved. Over the past week, he has become weak/ unsteady.  He is accompanied by his son who assists with history today.  + dizziness.   Today, he denies symptoms of palpitations, chest pain, shortness of breath,  lower extremity edema, presyncope, new neuro symptoms, or syncope.  The patient is otherwise without complaint today.   Past Medical History:  Diagnosis Date  . AAA (abdominal aortic aneurysm) ([place]) LAST ABDOMINAL US 10-20-17 3.3 CM   MONITORED BY DR [name]  . Anxiety   . Arthritis   . Atrial fibrillation ([place])   . Basal cell carcinoma   . Bilateral carotid artery stenosis DUPLEX 12-29-2012  BY DR [facility]   BILATERAL ICA STENOSIS 60-79%  . BPH (benign prostatic hypertrophy)   . Chronic diastolic heart failure ([place])   . Chronic kidney disease    stage 3, pt unaware  . Colon cancer ([place])   . Complication of anesthesia    had nausea after carotid artery surgery, states the medicine given to help the nausea, made it worse  . COPD (chronic obstructive pulmonary disease) ([place])   . Depression   . GERD (gastroesophageal reflux disease)   . Glaucoma BOTH EYES   Dr Gershon Crane  . History of basal cell carcinoma excision   . History of lung cancer APRIL 2012  SQUAMOUS CELL---- S/P RIGHT LOWER LOBECTOMY AT [facility] --  NO CHEMORADIATION---  NO RECURRENCE    ONCOLOGIST- DR Tressie Stalker  LOV IN [facility] 10-27-2012  . History of pneumothorax    pt unaware  . Hx of adenomatous colonic polyps 2005    X 2; 1 hyperplastic polyp; Dr Olevia Perches  . Hyperlipidemia   . Hypertension   . Impaired fasting glucose 2007    108; A1c5.4%  . Lesion of bladder   . Lung cancer ([place]) 01/25/2012  . Microhematuria   . OSA (obstructive sleep apnea) 08/29/2015   CPAP SET ON 10  . PAD (peripheral artery disease) ([place]) ABI'S  JAN 2014  0.65 ON RIGHT ;  1.04 ON LEFT  . Peripheral vascular disease ([place]) S/P ANGIOPLASTY AND STENTING   FOLLOWED  BY DR [name]  . Pleural effusion on right 11/18/2018   Chronic, noted on CXR  . Spinal stenosis Sept. 2015  . Status post placement of implantable loop recorder   . Stroke [facility]) Jul 07, 2013   mini  TIA  . Thoracic aorta atherosclerosis ([place])   . Thyrotoxicosis    amiodarone induced  . Urgency of urination    Past Surgical History:  Procedure Laterality Date  . ANGIO PLASTY     X 4 in legs  . AORTOGRAM  07-27-2002   MILD DIFFUSE ILIAC ARTERY OCCLUSIVE DISEASE /  LEFT RENAL ARTERY 20%/ PATENT LEFT FEM-POP GRAFT/ MILD SFA AND POPLITEAL ARTERY OCCLUSIVE DISEASE W/ SEVERE KIDNEY OCCLUSIVE DISEASE  . ATRIAL FIBRILLATION ABLATION N/A 04/09/2019   Procedure: ATRIAL FIBRILLATION ABLATION;  Surgeon: Thompson Grayer, MD;  Location: [place] [facility];  Service: Cardiovascular;  Laterality: N/A;  . ATRIAL FIBRILLATION ABLATION N/A 02/12/2020   Procedure: ATRIAL FIBRILLATION ABLATION;  Surgeon: Thompson Grayer,  MD;  Location: [place] [facility];  Service: Cardiovascular;  Laterality: N/A;  . BASAL CELL CARCINOMA EXCISION     MULTIPLE TIMES--  RIGHT FOREARM, CHEEKS, AND BACK  . BIOPSY  06/25/2018   Procedure: BIOPSY;  Surgeon: Rush Landmark Telford Nab., MD;  Location: [place];  Service: Gastroenterology;;  . CARDIOVASCULAR STRESS TEST  12-08-2012  DR MCLEAN   NORMAL LEXISCAN WITH NO EXERCISE NUCLEAR STUDY/ EF 66%/   NO ISCHEMIA/ NO SIGNIFICANT CHANGE FROM PRIOR STUDY  . CARDIOVERSION N/A 02/14/2018   Procedure: CARDIOVERSION;  Surgeon: Larey Dresser, MD;  Location: [facility] ENDOSCOPY;  Service: Cardiovascular;  Laterality: N/A;  . CAROTID ANGIOGRAM N/A 07/10/2013   Procedure: CAROTID  ANGIOGRAM;  Surgeon: Elam Dutch, MD;  Location: [facility] CATH LAB;  Service: Cardiovascular;  Laterality: N/A;  . CAROTID ENDARTERECTOMY Right 07-14-13   cea  . CATARACT EXTRACTION W/ INTRAOCULAR LENS  IMPLANT, BILATERAL    . colon polyectomy    . COLONOSCOPY WITH PROPOFOL N/A 06/25/2018   Procedure: COLONOSCOPY WITH PROPOFOL;  Surgeon: Rush Landmark Telford Nab., MD;  Location: [place];  Service: Gastroenterology;  Laterality: N/A;  . CYSTOSCOPY W/ RETROGRADES Bilateral 01/21/2013   Procedure: CYSTOSCOPY WITH RETROGRADE PYELOGRAM;  Surgeon: Molli Hazard, MD;  Location: [facility];  Service: Urology;  Laterality: Bilateral;   CYSTO, BLADDER BIOPSY, BILATERAL RETROGRADE PYELOGRAM  RAD TECH FROM RADIOLOGY PER JOY  . CYSTOSCOPY WITH BIOPSY N/A 01/21/2013   Procedure: CYSTOSCOPY WITH BIOPSY;  Surgeon: Molli Hazard, MD;  Location: [facility];  Service: Urology;  Laterality: N/A;  . ENDARTERECTOMY Right 07/14/2013   Procedure: ENDARTERECTOMY CAROTID;  Surgeon: Angelia Mould, MD;  Location: [place];  Service: Vascular;  Laterality: Right;  . EP IMPLANTABLE DEVICE N/A 08/12/2015   Procedure: Loop Recorder Insertion;  Surgeon: Thompson Grayer, MD;  Location: [place] [facility];  Service: Cardiovascular;  Laterality: N/A;  . ESOPHAGOGASTRODUODENOSCOPY (EGD) WITH PROPOFOL N/A 06/25/2018   Procedure: ESOPHAGOGASTRODUODENOSCOPY (EGD) WITH PROPOFOL;  Surgeon: Rush Landmark Telford Nab., MD;  Location: [place];  Service: Gastroenterology;  Laterality: N/A;  . ESOPHAGOGASTRODUODENOSCOPY (EGD) WITH PROPOFOL N/A 07/10/2018   Procedure: ESOPHAGOGASTRODUODENOSCOPY (EGD) WITH PROPOFOL;  Surgeon: [place] Banister, MD;  Location: [facility] ENDOSCOPY;  Service: Endoscopy;  Laterality: N/A;  . EUS N/A 07/10/2018   Procedure: UPPER ENDOSCOPIC ULTRASOUND (EUS) RADIAL;  Surgeon: [place] Banister, MD;  Location: [facility] ENDOSCOPY;  Service: Endoscopy;  Laterality: N/A;  . EYE  SURGERY Right   . FEMORAL-POPLITEAL BYPASS GRAFT Left 1994  [facility]   AND 2001  IN [place]  . FEMORAL-POPLITEAL BYPASS GRAFT Left 08/30/2015   Procedure: REVISION OF BYPASS GRAFT Left  FEMORAL-POPLITEAL ARTERY;  Surgeon: Angelia Mould, MD;  Location: [place];  Service: Vascular;  Laterality: Left;  . FINE NEEDLE ASPIRATION N/A 07/10/2018   Procedure: FINE NEEDLE ASPIRATION (FNA) LINEAR;  Surgeon: [place] Banister, MD;  Location: [facility] ENDOSCOPY;  Service: Endoscopy;  Laterality: N/A;  . FLEXIBLE SIGMOIDOSCOPY N/A 12/12/2018   Procedure: FLEXIBLE SIGMOIDOSCOPY;  Surgeon: Ileana Roup, MD;  Location: [facility] ORS;  Service: General;  Laterality: N/A;  . LARYNGOSCOPY  06-27-2004   BX VOCAL CORD  (LEUKOPLAKIA)  PER PT NO ISSUES SINCE  . LEFT HEART CATH AND CORONARY ANGIOGRAPHY N/A 08/20/2017   Procedure: LEFT HEART CATH AND CORONARY ANGIOGRAPHY;  Surgeon: Larey Dresser, MD;  Location: [place] CV LAB;  Service: Cardiovascular;  Laterality: N/A;  . LOOP RECORDER INSERTION N/A 04/09/2019   Procedure:  LOOP RECORDER INSERTION;  Surgeon: Thompson Grayer, MD;  Location: [place] CV LAB;  Service: Cardiovascular;  Laterality: N/A;  . LOOP RECORDER REMOVAL N/A 04/09/2019   Procedure: LOOP RECORDER REMOVAL;  Surgeon: Thompson Grayer, MD;  Location: [place] CV [facility];  Service: Cardiovascular;  Laterality: N/A;  . LOWER EXTREMITY ANGIOGRAM Bilateral 08/29/2015   Procedure: Lower Extremity Angiogram;  Surgeon: Angelia Mould, MD;  Location: [place] [facility];  Service: Cardiovascular;  Laterality: Bilateral;  . LUNG LOBECTOMY  01/24/2011    RIGHT UPPER LOBE  (SQUAMOUS CELL CARCINOMA) Dr Dorthea Cove , [facility]. No chemotherapyor radiation  . PATCH ANGIOPLASTY Right 07/14/2013   Procedure: PATCH ANGIOPLASTY;  Surgeon: Angelia Mould, MD;  Location: [place];  Service: Vascular;  Laterality: Right;  . PATCH ANGIOPLASTY Left 08/30/2015   Procedure: VEIN PATCH ANGIOPLASTY OF PROXIMAL Left BYPASS  GRAFT;  Surgeon: Angelia Mould, MD;  Location: [place];  Service: Vascular;  Laterality: Left;  . PERIPHERAL VASCULAR CATHETERIZATION N/A 08/29/2015   Procedure: Abdominal Aortogram;  Surgeon: Angelia Mould, MD;  Location: [place] [facility];  Service: Cardiovascular;  Laterality: N/A;  . POLYPECTOMY  06/25/2018   Procedure: POLYPECTOMY;  Surgeon: Rush Landmark Telford Nab., MD;  Location: [place];  Service: Gastroenterology;;  . Lia Foyer INJECTION  06/25/2018   Procedure: SUBMUCOSAL INJECTION;  Surgeon: Irving Copas., MD;  Location: [place];  Service: Gastroenterology;;  . TEE WITHOUT CARDIOVERSION N/A 02/14/2018   Procedure: TRANSESOPHAGEAL ECHOCARDIOGRAM (TEE);  Surgeon: Larey Dresser, MD;  Location: [facility] ENDOSCOPY;  Service: Cardiovascular;  Laterality: N/A;  . trabecular surgery     OS  . TRANSTHORACIC ECHOCARDIOGRAM  12-29-2012  DR [facility]   MILD LVH/  LVSF NORMAL/ EF 82-95%/  GRADE I DIASTOLIC DYSFUNCTION    ROS- all systems are personally reviewed and negatives except as per HPI above  Current Outpatient Medications  Medication Sig Dispense Refill  . amLODipine (NORVASC) 5 MG tablet Take 1 tablet (5 mg total) by mouth daily. 30 tablet 6  . apixaban (ELIQUIS) 5 MG TABS tablet Take 1 tablet (5 mg total) by mouth 2 (two) times daily. 60 tablet 6  . Ascorbic Acid (VITAMIN C PO) Take 500 mg by mouth in the morning and at bedtime.     . Biotin 2500 MCG CAPS Take 2,500 mcg by mouth daily.    . Brinzolamide-Brimonidine (SIMBRINZA) 1-0.2 % SUSP Place 1 drop into both eyes 2 times daily.    . budesonide-formoterol (SYMBICORT) 160-4.5 MCG/ACT inhaler Inhale 2 puffs into the lungs 2 (two) times daily.    [name] docusate sodium (COLACE) 100 MG capsule Take 100 mg by mouth 2 (two) times daily.     [name] escitalopram (LEXAPRO) 10 MG tablet Take 1 tablet (10 mg total) by mouth daily. 30 tablet 5  . ferrous sulfate 325 (65 FE) MG tablet Take 325 mg by mouth 2 (two) times daily  with a meal.     . FLUoxetine (PROZAC) 20 MG tablet Take 1 tablet (20 mg total) by mouth daily. 14 tablet 0  . furosemide (LASIX) 40 MG tablet Take 1 tablet (40 mg total) by mouth 2 (two) times daily. 60 tablet 3  . losartan (COZAAR) 25 MG tablet TAKE (2) TABLETS BY MOUTH DAILY. 60 tablet 5  . Magnesium 500 MG CAPS Take 500 mg by mouth daily.     . Melatonin 5 MG TABS Take 5 mg by mouth at bedtime.    . metoprolol succinate (TOPROL-XL) 25 MG 24  hr tablet Take 0.5 tablets (12.5 mg total) by mouth at bedtime. 15 tablet 6  . montelukast (SINGULAIR) 10 MG tablet TAKE ONE TABLET AT BEDTIME. 30 tablet 5  . Multiple Vitamins-Minerals (CENTRUM SILVER PO) Take 1 tablet by mouth daily.    . Multiple Vitamins-Minerals (ZINC PO) Take 1 tablet by mouth daily.    [name] omeprazole (PRILOSEC) 20 MG capsule Take 1 capsule (20 mg total) by mouth 2 (two) times daily as needed. Take 30- 60 min before your first and last meals of the day 180 capsule 1  . Polyethyl Glycol-Propyl Glycol (SYSTANE) 0.4-0.3 % SOLN Place 1 drop into both eyes daily as needed (for dry eyes).     . potassium chloride SA (KLOR-CON) 20 MEQ tablet Take 1 tablet (20 mEq total) by mouth daily. 30 tablet 6  . rosuvastatin (CRESTOR) 40 MG tablet Take 1 tablet (40 mg total) by mouth daily. 30 tablet 6  . senna (SENOKOT) 8.6 MG tablet Take 1 tablet by mouth 2 (two) times daily.     No current facility-administered medications for this visit.    Physical Exam: Vitals:   05/16/20 1537  BP: 138/68  Pulse: (!) 59  SpO2: 98%  Weight: 191 lb (86.6 kg)  Height: 6' 1.5" (1.867 m)    GEN- The patient is ill appearing, walks slowly with a walker today,  His son assists with ambulation, alert and oriented x 3 today.   Head- normocephalic, atraumatic Eyes-  Sclera clear, conjunctiva pink Ears- hearing intact Oropharynx- clear Lungs-   normal work of breathing Heart- Regular rate and rhythm  GI- soft  Extremities- no clubbing, cyanosis, or  edema  EKG tracing ordered today is personally reviewed and shows sinus rhythm 59 bpm, PR 170 msec, QRS 74 msec, QTc 433 msec  Assessment and Plan:  1. Paroxysmal atrial fibrillation Doing well s/p ablation chads2vasc score is at least 6 Continue on eliquis.  Check bmet, cbc today afib burden by ILR was 0.6 % (previously 12.9%)  2. Chronic diastolic dysfunction He appears dry today Reduced lasix to 40mg  daily Check bmet, cbc today Daily weights advised Heat avoidance and adequate hydration encouraged  3. OSA Compliance with CPAP advised  4. HL Continue crestor  Return to see me in 3 months  Thompson Grayer MD, [facility] 05/16/2020 3:44 PM

## 2020-05-17 LAB — CBC WITH DIFFERENTIAL/PLATELET
Basophils Absolute: 0 10*3/uL (ref 0.0–0.2)
Basos: 0 %
EOS (ABSOLUTE): 0.1 10*3/uL (ref 0.0–0.4)
Eos: 2 %
Hematocrit: 37.9 % (ref 37.5–51.0)
Hemoglobin: 12.4 g/dL — ABNORMAL LOW (ref 13.0–17.7)
Immature Grans (Abs): 0 10*3/uL (ref 0.0–0.1)
Immature Granulocytes: 0 %
Lymphocytes Absolute: 0.9 10*3/uL (ref 0.7–3.1)
Lymphs: 17 %
MCH: 30.8 pg (ref 26.6–33.0)
MCHC: 32.7 g/dL (ref 31.5–35.7)
MCV: 94 fL (ref 79–97)
Monocytes Absolute: 0.3 10*3/uL (ref 0.1–0.9)
Monocytes: 6 %
Neutrophils Absolute: 4 10*3/uL (ref 1.4–7.0)
Neutrophils: 75 %
Platelets: 329 10*3/uL (ref 150–450)
RBC: 4.03 x10E6/uL — ABNORMAL LOW (ref 4.14–5.80)
RDW: 14.2 % (ref 11.6–15.4)
WBC: 5.4 10*3/uL (ref 3.4–10.8)

## 2020-05-17 LAB — BASIC METABOLIC PANEL
BUN/Creatinine Ratio: 16 (ref 10–24)
BUN: 17 mg/dL (ref 8–27)
CO2: 25 mmol/L (ref 20–29)
Calcium: 9 mg/dL (ref 8.6–10.2)
Chloride: 105 mmol/L (ref 96–106)
Creatinine, Ser: 1.09 mg/dL (ref 0.76–1.27)
GFR calc Af Amer: 74 mL/min/{1.73_m2} (ref >59)
GFR calc non Af Amer: 64 mL/min/{1.73_m2} (ref >59)
Glucose: 134 mg/dL — ABNORMAL HIGH (ref 65–99)
Potassium: 4.3 mmol/L (ref 3.5–5.2)
Sodium: 142 mmol/L (ref 134–144)

## 2020-05-18 ENCOUNTER — Telehealth (HOSPITAL_COMMUNITY): Payer: Self-pay

## 2020-05-18 NOTE — Telephone Encounter (Signed)
Called pt again on both numbers listed in chart, no answer and LVM for him to return my call to see if he would like another home visit.  Last time I spoke to him he state he didn't need it and for me to call back this week. So far, no answer. I called yesterday as well.   Marylouise Stacks, EMT-Paramedic  05/18/20

## 2020-05-23 ENCOUNTER — Telehealth (HOSPITAL_COMMUNITY): Payer: Self-pay

## 2020-05-23 ENCOUNTER — Ambulatory Visit (INDEPENDENT_AMBULATORY_CARE_PROVIDER_SITE_OTHER): Payer: PPO | Admitting: *Deleted

## 2020-05-23 DIAGNOSIS — I480 Paroxysmal atrial fibrillation: Secondary | ICD-10-CM

## 2020-05-23 LAB — CUP PACEART REMOTE DEVICE CHECK
Date Time Interrogation Session: 20210820231901
Implantable Pulse Generator Implant Date: 20200709

## 2020-05-23 NOTE — Telephone Encounter (Signed)
Spoke to pt today regarding future home visits. Pt states he has been doing much better, he feels there is no need for anymore visits at this time. He has numerous of resources in the home. I advised him that should he feel he needs more visits then to call me or contact clinic for them to reach me. He denies any issues or medical concerns that need attention. No sob. No c/p. No dizziness. He makes it to his appointments. Pt is agreeable to plan.  Will d/c from paramedicine at this time.   Marylouise Stacks, EMT-Paramedic  05/23/20

## 2020-05-24 LAB — CUP PACEART INCLINIC DEVICE CHECK
Date Time Interrogation Session: 20210816171710
Implantable Pulse Generator Implant Date: 20200709

## 2020-05-25 DIAGNOSIS — H401133 Primary open-angle glaucoma, bilateral, severe stage: Secondary | ICD-10-CM | POA: Diagnosis not present

## 2020-05-27 ENCOUNTER — Ambulatory Visit: Payer: PPO | Admitting: Gastroenterology

## 2020-05-30 NOTE — Progress Notes (Signed)
Carelink Summary Report / Loop Recorder 

## 2020-06-02 ENCOUNTER — Encounter: Payer: Self-pay | Admitting: Internal Medicine

## 2020-06-02 ENCOUNTER — Other Ambulatory Visit: Payer: Self-pay

## 2020-06-02 ENCOUNTER — Ambulatory Visit: Payer: PPO | Admitting: Internal Medicine

## 2020-06-02 VITALS — BP 138/86 | HR 55 | Ht 73.0 in | Wt 196.0 lb

## 2020-06-02 DIAGNOSIS — E0580 Other thyrotoxicosis without thyrotoxic crisis or storm: Secondary | ICD-10-CM

## 2020-06-02 LAB — T4, FREE: Free T4: 0.92 ng/dL (ref 0.60–1.60)

## 2020-06-02 LAB — T3, FREE: T3, Free: 3 pg/mL (ref 2.3–4.2)

## 2020-06-02 LAB — TSH: TSH: 2.05 u[IU]/mL (ref 0.35–4.50)

## 2020-06-02 NOTE — Progress Notes (Signed)
Patient ID: Troy Wildeman [name], male   DOB: [birth year], 79 y.o.   MRN: [id number]   This visit occurred during the SARS-CoV-2 public health emergency.  Safety protocols were in place, including screening questions prior to the visit, additional usage of staff PPE, and extensive cleaning of exam room while observing appropriate contact time as indicated for disinfecting solutions.   HPI  Troy Hulgan [name] is a 79 y.o.-year-old male, initially referred by his cardiologist, Troy [name], presenting for follow-up for amiodarone-induced thyrotoxicosis.  Last visit was 6 months ago.  At this visit, he tells me that he feels better more recently, with less fatigue and less palpitations.  Reviewed and addended history: Patient is seen in the cardiology clinic for several cardiovascular conditions: paroxysmal Afib, Diastolic CHF, CAD.  He also has a history of AAA, PAD, history of TIA.  He was found to have a low TSH at his visit on 07/14/2018.  He had repeat TFTs 9 days later and this showed again a suppressed TSH and also elevated free T4 and free T3.    She was taking a high dose of biotin, 10,000 mcg daily and he took this before his TFTs in 10-08/2018. He had a CT of the chest with contrast on 06/25/2018.  At that time, his amiodarone (that he was taking for almost 1 year) was stopped.    He was started on methimazole 10 mg daily.  He also started Toprol-XL 25 mg daily.  We had to increase the dose of methimazole slightly to 15 mg daily >> but then decreased to 10 mg daily.  He continues on this dose today.  We also started Prednisone 10 mg daily >> we were then able to decrease the dose and finally stop 10/2018.  TFTs normalized in 11/2018.  However, afterwards TSH increased so we decreased her methimazole - in 03/2019: 2.5 mg daily.  In 05/2019, we stopped methimazole completely and his TFTs remained normal. Metoprolol was cut down to 12.5 mg daily.   Reviewed his TFTs: Lab Results   Component Value Date   TSH 1.166 04/21/2020   TSH 2.48 11/30/2019   TSH 3.52 05/29/2019   TSH 5.73 (H) 03/02/2019   TSH 11.765 (H) 01/29/2019   TSH 3.57 11/27/2018   TSH 0.02 (L) 10/27/2018   TSH <0.010 (L) 10/14/2018   TSH <0.01 (L) 09/26/2018   TSH <0.01 (L) 08/27/2018   FREET4 1.12 11/30/2019   FREET4 0.93 05/29/2019   FREET4 0.73 03/02/2019   FREET4 0.70 (L) 01/29/2019   FREET4 0.90 11/27/2018   FREET4 1.34 10/27/2018   FREET4 1.71 10/14/2018   FREET4 2.64 (H) 09/26/2018   FREET4 3.84 (H) 08/27/2018   FREET4 2.94 (H) 07/23/2018   T3FREE 3.4 11/30/2019   T3FREE 3.0 05/29/2019   T3FREE 2.6 03/02/2019   T3FREE 2.9 11/27/2018   T3FREE 3.0 10/27/2018   T3FREE 3.1 10/14/2018   T3FREE 3.9 09/26/2018   T3FREE 5.6 (H) 08/27/2018   T3FREE 4.7 (H) 07/23/2018   T3FREE 3.0 07/29/2006   His Graves' antibodies were not elevated: Lab Results  Component Value Date   TSI <89 08/27/2018   Pt denies: - feeling nodules in neck - hoarseness - dysphagia - choking - SOB with lying down  Pt does not have a FH of thyroid ds. No FH of thyroid cancer. No h/o radiation tx to head or neck.  No seaweed or kelp. No recent contrast studies. No herbal supplements. No Biotin use. No recent steroids use.  Pt. also has a history of lung cancer, HL, COPD, OSA-on CPAP. He also has a history of colon cancer and had a low anterior resection 11/2018.  No metastases.  No need for chemotherapy and radiation therapy.   His oncologist is Troy [name].  ROS: Constitutional: no weight gain/no weight loss, no fatigue, no subjective hyperthermia, no subjective hypothermia Eyes: no blurry vision, no xerophthalmia ENT: no sore throat, + see HPI Cardiovascular: no CP/no SOB/+ improved palpitations/no leg swelling Respiratory: no cough/no SOB/no wheezing Gastrointestinal: no N/no V/no D/+ C/no acid reflux Musculoskeletal: no muscle aches/no joint aches Skin: no rashes, no hair loss Neurological: no  tremors/no numbness/no tingling/+ dizziness  I reviewed pt's medications, allergies, PMH, social hx, family hx, and changes were documented in the history of present illness. Otherwise, unchanged from my initial visit note, except his dose of Lasix was decreased from last visit  Past Medical History:  Diagnosis Date  . AAA (abdominal aortic aneurysm) ([name]) LAST ABDOMINAL US 10-20-17 3.3 CM   MONITORED BY Troy [name]  . Anxiety   . Arthritis   . Atrial fibrillation ([place])   . Basal cell carcinoma   . Bilateral carotid artery stenosis DUPLEX 12-29-2012  BY Troy [facility]   BILATERAL ICA STENOSIS 60-79%  . BPH (benign prostatic hypertrophy)   . Chronic diastolic heart failure ([place])   . Chronic kidney disease    stage 3, pt unaware  . Colon cancer ([place])   . Complication of anesthesia    had nausea after carotid artery surgery, states the medicine given to help the nausea, made it worse  . COPD (chronic obstructive pulmonary disease) ([place])   . Depression   . GERD (gastroesophageal reflux disease)   . Glaucoma BOTH EYES   Troy [name]  . History of basal cell carcinoma excision   . History of lung cancer APRIL 2012  SQUAMOUS CELL---- S/P RIGHT LOWER LOBECTOMY AT [facility] --  NO CHEMORADIATION---  NO RECURRENCE    ONCOLOGIST- Troy Tressie Stalker  LOV IN [facility] 10-27-2012  . History of pneumothorax    pt unaware  . Hx of adenomatous colonic polyps 2005    X 2; 1 hyperplastic polyp; Troy Olevia Perches  . Hyperlipidemia   . Hypertension   . Impaired fasting glucose 2007   108; A1c5.4%  . Lesion of bladder   . Lung cancer ([place]) 01/25/2012  . Microhematuria   . OSA (obstructive sleep apnea) 08/29/2015   CPAP SET ON 10  . PAD (peripheral artery disease) ([place]) ABI'S  JAN 2014  0.65 ON RIGHT ;  1.04 ON LEFT  . Peripheral vascular disease ([place]) S/P ANGIOPLASTY AND STENTING   FOLLOWED  BY Troy [name]  . Pleural effusion on right 11/18/2018   Chronic, noted on CXR  . Spinal stenosis Sept. 2015  . Status post placement  of implantable loop recorder   . Stroke [facility]) Jul 07, 2013   mini  TIA  . Thoracic aorta atherosclerosis ([place])   . Thyrotoxicosis    amiodarone induced  . Urgency of urination    Past Surgical History:  Procedure Laterality Date  . ANGIO PLASTY     X 4 in legs  . AORTOGRAM  07-27-2002   MILD DIFFUSE ILIAC ARTERY OCCLUSIVE DISEASE /  LEFT RENAL ARTERY 20%/ PATENT LEFT FEM-POP GRAFT/ MILD SFA AND POPLITEAL ARTERY OCCLUSIVE DISEASE W/ SEVERE KIDNEY OCCLUSIVE DISEASE  . ATRIAL FIBRILLATION ABLATION N/A 04/09/2019   Procedure: ATRIAL FIBRILLATION ABLATION;  Surgeon: Thompson Grayer, MD;  Location: [place]  CV LAB;  Service: Cardiovascular;  Laterality: N/A;  . ATRIAL FIBRILLATION ABLATION N/A 02/12/2020   Procedure: ATRIAL FIBRILLATION ABLATION;  Surgeon: Thompson Grayer, MD;  Location: [place] CV LAB;  Service: Cardiovascular;  Laterality: N/A;  . BASAL CELL CARCINOMA EXCISION     MULTIPLE TIMES--  RIGHT FOREARM, CHEEKS, AND BACK  . BIOPSY  06/25/2018   Procedure: BIOPSY;  Surgeon: Rush Landmark Telford Nab., MD;  Location: [place];  Service: Gastroenterology;;  . CARDIOVASCULAR STRESS TEST  12-08-2012  Troy MCLEAN   NORMAL LEXISCAN WITH NO EXERCISE NUCLEAR STUDY/ EF 66%/   NO ISCHEMIA/ NO SIGNIFICANT CHANGE FROM PRIOR STUDY  . CARDIOVERSION N/A 02/14/2018   Procedure: CARDIOVERSION;  Surgeon: Larey Dresser, MD;  Location: [facility] ENDOSCOPY;  Service: Cardiovascular;  Laterality: N/A;  . CAROTID ANGIOGRAM N/A 07/10/2013   Procedure: CAROTID ANGIOGRAM;  Surgeon: Elam Dutch, MD;  Location: [facility] CATH LAB;  Service: Cardiovascular;  Laterality: N/A;  . CAROTID ENDARTERECTOMY Right 07-14-13   cea  . CATARACT EXTRACTION W/ INTRAOCULAR LENS  IMPLANT, BILATERAL    . colon polyectomy    . COLONOSCOPY WITH PROPOFOL N/A 06/25/2018   Procedure: COLONOSCOPY WITH PROPOFOL;  Surgeon: Rush Landmark Telford Nab., MD;  Location: [place];  Service: Gastroenterology;  Laterality: N/A;  . CYSTOSCOPY W/  RETROGRADES Bilateral 01/21/2013   Procedure: CYSTOSCOPY WITH RETROGRADE PYELOGRAM;  Surgeon: Molli Hazard, MD;  Location: [facility];  Service: Urology;  Laterality: Bilateral;   CYSTO, BLADDER BIOPSY, BILATERAL RETROGRADE PYELOGRAM  RAD TECH FROM RADIOLOGY PER JOY  . CYSTOSCOPY WITH BIOPSY N/A 01/21/2013   Procedure: CYSTOSCOPY WITH BIOPSY;  Surgeon: Molli Hazard, MD;  Location: [facility];  Service: Urology;  Laterality: N/A;  . ENDARTERECTOMY Right 07/14/2013   Procedure: ENDARTERECTOMY CAROTID;  Surgeon: Angelia Mould, MD;  Location: [place];  Service: Vascular;  Laterality: Right;  . EP IMPLANTABLE DEVICE N/A 08/12/2015   Procedure: Loop Recorder Insertion;  Surgeon: Thompson Grayer, MD;  Location: [place] CV LAB;  Service: Cardiovascular;  Laterality: N/A;  . ESOPHAGOGASTRODUODENOSCOPY (EGD) WITH PROPOFOL N/A 06/25/2018   Procedure: ESOPHAGOGASTRODUODENOSCOPY (EGD) WITH PROPOFOL;  Surgeon: Rush Landmark Telford Nab., MD;  Location: [place];  Service: Gastroenterology;  Laterality: N/A;  . ESOPHAGOGASTRODUODENOSCOPY (EGD) WITH PROPOFOL N/A 07/10/2018   Procedure: ESOPHAGOGASTRODUODENOSCOPY (EGD) WITH PROPOFOL;  Surgeon: Milus Banister, MD;  Location: [facility] ENDOSCOPY;  Service: Endoscopy;  Laterality: N/A;  . EUS N/A 07/10/2018   Procedure: UPPER ENDOSCOPIC ULTRASOUND (EUS) RADIAL;  Surgeon: Milus Banister, MD;  Location: [facility] ENDOSCOPY;  Service: Endoscopy;  Laterality: N/A;  . EYE SURGERY Right   . FEMORAL-POPLITEAL BYPASS GRAFT Left [facility]   AND 2001  IN [place]  . FEMORAL-POPLITEAL BYPASS GRAFT Left 08/30/2015   Procedure: REVISION OF BYPASS GRAFT Left  FEMORAL-POPLITEAL ARTERY;  Surgeon: Angelia Mould, MD;  Location: [place];  Service: Vascular;  Laterality: Left;  . FINE NEEDLE ASPIRATION N/A 07/10/2018   Procedure: FINE NEEDLE ASPIRATION (FNA) LINEAR;  Surgeon: Milus Banister, MD;  Location: [facility] ENDOSCOPY;   Service: Endoscopy;  Laterality: N/A;  . FLEXIBLE SIGMOIDOSCOPY N/A 12/12/2018   Procedure: FLEXIBLE SIGMOIDOSCOPY;  Surgeon: Ileana Roup, MD;  Location: [facility] ORS;  Service: General;  Laterality: N/A;  . LARYNGOSCOPY  06-27-2004   BX VOCAL CORD  (LEUKOPLAKIA)  PER PT NO ISSUES SINCE  . LEFT HEART CATH AND CORONARY ANGIOGRAPHY N/A 08/20/2017   Procedure: LEFT HEART CATH AND CORONARY ANGIOGRAPHY;  Surgeon: Aundra [name],  Elby Showers, MD;  Location: [place] CV LAB;  Service: Cardiovascular;  Laterality: N/A;  . LOOP RECORDER INSERTION N/A 04/09/2019   Procedure: LOOP RECORDER INSERTION;  Surgeon: Thompson Grayer, MD;  Location: [place] CV LAB;  Service: Cardiovascular;  Laterality: N/A;  . LOOP RECORDER REMOVAL N/A 04/09/2019   Procedure: LOOP RECORDER REMOVAL;  Surgeon: Thompson Grayer, MD;  Location: [place] CV LAB;  Service: Cardiovascular;  Laterality: N/A;  . LOWER EXTREMITY ANGIOGRAM Bilateral 08/29/2015   Procedure: Lower Extremity Angiogram;  Surgeon: Angelia Mould, MD;  Location: [place] CV LAB;  Service: Cardiovascular;  Laterality: Bilateral;  . LUNG LOBECTOMY  01/24/2011    RIGHT UPPER LOBE  (SQUAMOUS CELL CARCINOMA) [facility]. No chemotherapyor radiation  . PATCH ANGIOPLASTY Right 07/14/2013   Procedure: PATCH ANGIOPLASTY;  Surgeon: Angelia Mould, MD;  Location: [place];  Service: Vascular;  Laterality: Right;  . PATCH ANGIOPLASTY Left 08/30/2015   Procedure: VEIN PATCH ANGIOPLASTY OF PROXIMAL Left BYPASS GRAFT;  Surgeon: Angelia Mould, MD;  Location: [place];  Service: Vascular;  Laterality: Left;  . PERIPHERAL VASCULAR CATHETERIZATION N/A 08/29/2015   Procedure: Abdominal Aortogram;  Surgeon: Angelia Mould, MD;  Location: [place] CV LAB;  Service: Cardiovascular;  Laterality: N/A;  . POLYPECTOMY  06/25/2018   Procedure: POLYPECTOMY;  Surgeon: Rush Landmark Telford Nab., MD;  Location: [place];  Service: Gastroenterology;;  . Lia Foyer  INJECTION  06/25/2018   Procedure: SUBMUCOSAL INJECTION;  Surgeon: Irving Copas., MD;  Location: [place];  Service: Gastroenterology;;  . TEE WITHOUT CARDIOVERSION N/A 02/14/2018   Procedure: TRANSESOPHAGEAL ECHOCARDIOGRAM (TEE);  Surgeon: Larey Dresser, MD;  Location: [facility] ENDOSCOPY;  Service: Cardiovascular;  Laterality: N/A;  . trabecular surgery     OS  . TRANSTHORACIC ECHOCARDIOGRAM  12-29-2012  [facility]   MILD LVH/  LVSF NORMAL/ EF 40-98%/  GRADE I DIASTOLIC DYSFUNCTION   Social History   Socioeconomic History  . Marital status: Married    Spouse name: [name]   . Number of children: 3  . Years of education: 12+  . Highest education level: Not on file  Occupational History  . 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 Smoking status: Former Smoker    Packs/day: 0.50    Years: 50.00    Pack years: 25.00    Types: Cigarettes    Quit date: 07/28/2014    Years since quitting: 5.8  . Smokeless tobacco: Never Used  Vaping Use  . Vaping Use: Never used  Substance and Sexual Activity  . Alcohol use: Yes    Alcohol/week: 4.0 standard drinks    Types: 1 Glasses of wine, 1 Cans of beer, 1 Shots of liquor, 1 Standard drinks or equivalent per week    Comment:  socially, variable  . Drug use: No  . Sexual activity: Not on file  Other Topics Concern  . Not on file  Social History Narrative   Patient lives at home with spouse [name]   Patient has 3 children    Patient is right handed    Social Determinants of Health   Financial Resource Strain:   . Difficulty of Paying Living Expenses: Not on file  Food Insecurity:   . Worried About Charity fundraiser in the Last Year: Not on file  . Ran Out of Food in the  Last Year: Not on file  Transportation Needs:   . Lack of Transportation (Medical): Not on file  . Lack of Transportation  (Non-Medical): Not on file  Physical Activity:   . Days of Exercise per Week: Not on file  . Minutes of Exercise per Session: Not on file  Stress:   . Feeling of Stress : Not on file  Social Connections:   . Frequency of Communication with Friends and Family: Not on file  . Frequency of Social Gatherings with Friends and Family: Not on file  . Attends Religious Services: Not on file  . Active Member of Clubs or Organizations: Not on file  . Attends Archivist Meetings: Not on file  . Marital Status: Not on file  Intimate Partner Violence:   . Fear of Current or Ex-Partner: Not on file  . Emotionally Abused: Not on file  . Physically Abused: Not on file  . Sexually Abused: Not on file   Current Outpatient Medications on File Prior to Visit  Medication Sig Dispense Refill  . amLODipine (NORVASC) 5 MG tablet Take 1 tablet (5 mg total) by mouth daily. 30 tablet 6  . apixaban (ELIQUIS) 5 MG TABS tablet Take 1 tablet (5 mg total) by mouth 2 (two) times daily. 60 tablet 6  . Ascorbic Acid (VITAMIN C PO) Take 500 mg by mouth in the morning and at bedtime.     . Biotin 2500 MCG CAPS Take 2,500 mcg by mouth daily.    . Brinzolamide-Brimonidine (SIMBRINZA) 1-0.2 % SUSP Place 1 drop into both eyes 2 times daily.    . budesonide-formoterol (SYMBICORT) 160-4.5 MCG/ACT inhaler Inhale 2 puffs into the lungs 2 (two) times daily.    [name] docusate sodium (COLACE) 100 MG capsule Take 100 mg by mouth 2 (two) times daily.     [name] escitalopram (LEXAPRO) 10 MG tablet Take 1 tablet (10 mg total) by mouth daily. 30 tablet 5  . ferrous sulfate 325 (65 FE) MG tablet Take 325 mg by mouth 2 (two) times daily with a meal.     . FLUoxetine (PROZAC) 20 MG tablet Take 1 tablet (20 mg total) by mouth daily. 14 tablet 0  . furosemide (LASIX) 40 MG tablet Take 1 tablet (40 mg total) by mouth 2 (two) times daily. 60 tablet 3  . furosemide (LASIX) 40 MG tablet Take 1 tablet (40 mg total) by mouth daily. 90 tablet 3   . losartan (COZAAR) 25 MG tablet TAKE (2) TABLETS BY MOUTH DAILY. 60 tablet 5  . Magnesium 500 MG CAPS Take 500 mg by mouth daily.     . Melatonin 5 MG TABS Take 5 mg by mouth at bedtime.    . metoprolol succinate (TOPROL-XL) 25 MG 24 hr tablet Take 0.5 tablets (12.5 mg total) by mouth at bedtime. 15 tablet 6  . montelukast (SINGULAIR) 10 MG tablet TAKE ONE TABLET AT BEDTIME. 30 tablet 5  . Multiple Vitamins-Minerals (CENTRUM SILVER PO) Take 1 tablet by mouth daily.    . Multiple Vitamins-Minerals (ZINC PO) Take 1 tablet by mouth daily.    [name] omeprazole (PRILOSEC) 20 MG capsule Take 1 capsule (20 mg total) by mouth 2 (two) times daily as needed. Take 30- 60 min before your first and last meals of the day 180 capsule 1  . Polyethyl Glycol-Propyl Glycol (SYSTANE) 0.4-0.3 % SOLN Place 1 drop into both eyes daily as needed (for dry eyes).     . potassium chloride SA (KLOR-CON) 20 MEQ  tablet Take 1 tablet (20 mEq total) by mouth daily. 30 tablet 6  . rosuvastatin (CRESTOR) 40 MG tablet Take 1 tablet (40 mg total) by mouth daily. 30 tablet 6  . senna (SENOKOT) 8.6 MG tablet Take 1 tablet by mouth 2 (two) times daily.     No current facility-administered medications on file prior to visit.   Allergies  Allergen Reactions  . Niacin-Lovastatin Er Shortness Of Breath and Other (See Comments)    Dyspnea, flushing  . Penicillins Hives, Shortness Of Breath and Other (See Comments)    Flushing  & dyspnea Because of a history of documented adverse serious drug reaction;Medi Alert bracelet  is recommended PCN reaction causing immediate rash, facial/tongue/throat swelling, SOB or lightheadedness with hypotension: Yes PCN reaction causing severe rash involving mucus membranes or skin necrosis: No PCN reaction occurring within the last 10 years: NO PCN reaction that required hospitalization: NO  . Sulfonamide Derivatives Hives, Shortness Of Breath and Other (See Comments)    Flushing & dyspnea Because of a  history of documented adverse serious drug reaction;Medi Alert bracelet  is recommended  . Atorvastatin Other (See Comments)    Myalgias & athralgias- takes Crestor, DOES NOT LIKE LIPITOR   Family History  Problem Relation Age of Onset  . Stroke Mother        mini strokes  . Alcohol abuse Father   . Heart disease Father        MI after 40  . Stroke Father   . Hypertension Father   . Heart attack Father   . Heart disease Paternal Aunt        several  . Hypertension Paternal Aunt        several  . Stroke Paternal Aunt        several  . Stroke Paternal Uncle        several  . Heart disease Paternal Uncle        several;2 had MI pre 49  . Cancer Daughter 25       breast ca, also with benign sessile polyp     PE: BP 138/86   Pulse (!) 55   Ht 6\' 1"  (1.854 m)   Wt 196 lb (88.9 kg)   SpO2 92%   BMI 25.86 kg/m  Wt Readings from Last 3 Encounters:  06/02/20 196 lb (88.9 kg)  05/16/20 191 lb (86.6 kg)  05/09/20 191 lb 6.4 oz (86.8 kg)   Constitutional: Slightly overweight, in NAD Eyes: PERRLA, EOMI, no exophthalmos ENT: moist mucous membranes, no thyromegaly, no cervical lymphadenopathy Cardiovascular: Irregularly irregular rhythm, RR, No MRG Respiratory: CTA B Gastrointestinal: abdomen soft, NT, ND, BS+ Musculoskeletal: no deformities, strength intact in all 4 Skin: moist, warm, no rashes Neurological: no tremor with outstretched hands, DTR normal in all 4  ASSESSMENT: 1.  Amiodarone-induced thyrotoxicosis (AIT)  PLAN:  1. Patient with history of amiodarone induced thyrotoxicosis, without thyrotoxic symptoms other than possibly weight loss (which, he mentions, was intentional).  He did not have heat intolerance, palpitations, anxiety, hypertension patient.  He does have a history of dark paroxystic atrial fibrillation and was on amiodarone around the time of his initial investigation for thyrotoxicosis he was also taking a high dose of biotin, which can influence the  thyroid labs. However, the thyroid tests were abnormal even after stopping the supplement. -We initially started methimazole and then also added prednisone 10 mg daily.  He did well afterwards and we were able to decrease dose medication doses.  We ended up stopping prednisone completely 10/2018 and stopping methimazole completely 05/2019. -He had 3 sets of thyroid labs obtained afterwards which were normal, including on 04/21/2020 -He continues on Toprol-XL 12.5 mg daily for his A. Fib -He tells me he feels better: no tremors anymore (had these at last visit), and palpitations improved. He lost 3 lbs since last OV. -At today's visit, we will need to recheck his TFTs to see if we need to restart methimazole -If the tests are normal, I will plan to see him back in 1 year.  Component     Latest Ref Rng & Units 06/02/2020  TSH     0.35 - 4.50 uIU/mL 2.05  Triiodothyronine,Free,Serum     2.3 - 4.2 pg/mL 3.0  T4,Free(Direct)     0.60 - 1.60 ng/dL 0.92  Thyroid tests are normal.  Philemon Kingdom, MD PhD [facility] Endocr[facility]

## 2020-06-02 NOTE — Patient Instructions (Signed)
Please continue off methimazole.  Please stop at the lab.  Please come back for a follow-up appointment in 1 year.

## 2020-06-03 ENCOUNTER — Encounter: Payer: Self-pay | Admitting: Internal Medicine

## 2020-06-09 ENCOUNTER — Other Ambulatory Visit: Payer: Self-pay

## 2020-06-09 ENCOUNTER — Ambulatory Visit (AMBULATORY_SURGERY_CENTER): Payer: Self-pay | Admitting: *Deleted

## 2020-06-09 VITALS — Ht 73.5 in | Wt 198.0 lb

## 2020-06-09 DIAGNOSIS — Z86010 Personal history of colonic polyps: Secondary | ICD-10-CM

## 2020-06-09 DIAGNOSIS — Z85038 Personal history of other malignant neoplasm of large intestine: Secondary | ICD-10-CM

## 2020-06-09 DIAGNOSIS — R1013 Epigastric pain: Secondary | ICD-10-CM

## 2020-06-09 MED ORDER — PLENVU 140 G PO SOLR: 1.0000 | ORAL | 0 refills | Status: DC

## 2020-06-09 NOTE — Progress Notes (Signed)
cov vax x 2  No egg or soy allergy known to patient  No issues with past sedation with any surgeries or procedures no intubation problems in the past  No FH of Malignant Hyperthermia No diet pills per patient No home 02 use per patient  No blood thinners per patient  Pt denies issues with constipation - miralax prn, colace daily - uses a liquid laxative as needed- states has daily soft regular BM's currently  No A fib or A flutter  EMMI video to pt or via [place] 19 guidelines implemented in [place] today with Pt and RN   PLENVU  Is the prep pt choose to do in [facility] today - [facility] plenvu coupon to pt   Due to the COVID-19 pandemic we are asking patients to follow these guidelines. Please only bring one care partner. Please be aware that your care partner may wait in the car in the parking lot or if they feel like they will be too hot to wait in the car, they may wait in the lobby on the 4th floor. All care partners are required to wear a mask the entire time (we do not have any that we can provide them), they need to practice social distancing, and we will do a Covid check for all patient's and care partners when you arrive. Also we will check their temperature and your temperature. If the care partner waits in their car they need to stay in the parking lot the entire time and we will call them on their cell phone when the patient is ready for discharge so they can bring the car to the front of the building. Also all patient's will need to wear a mask into building.

## 2020-06-10 ENCOUNTER — Encounter: Payer: Self-pay | Admitting: Gastroenterology

## 2020-06-10 DIAGNOSIS — H524 Presbyopia: Secondary | ICD-10-CM | POA: Diagnosis not present

## 2020-06-10 DIAGNOSIS — H5202 Hypermetropia, left eye: Secondary | ICD-10-CM | POA: Diagnosis not present

## 2020-06-14 ENCOUNTER — Ambulatory Visit: Payer: Self-pay | Attending: Internal Medicine

## 2020-06-14 DIAGNOSIS — Z23 Encounter for immunization: Secondary | ICD-10-CM

## 2020-06-14 NOTE — Progress Notes (Signed)
   [id number]  Name:  Troy Highbaugh [name]    MRN: [id number] DOB: 1941-09-21  06/14/2020  Troy [name] was observed post Covid-19 immunization for 15 minutes without incident. He was provided with Vaccine Information Sheet and instruction to access the V-Safe system.   Troy [name] was instructed to call 911 with any severe reactions post vaccine: [name] Difficulty breathing  . Swelling of face and throat  . A fast heartbeat  . A bad rash all over body  . Dizziness and weakness

## 2020-06-23 ENCOUNTER — Other Ambulatory Visit (INDEPENDENT_AMBULATORY_CARE_PROVIDER_SITE_OTHER): Payer: PPO

## 2020-06-23 ENCOUNTER — Other Ambulatory Visit: Payer: Self-pay

## 2020-06-23 ENCOUNTER — Encounter: Payer: Self-pay | Admitting: Gastroenterology

## 2020-06-23 ENCOUNTER — Ambulatory Visit (AMBULATORY_SURGERY_CENTER): Payer: PPO | Admitting: Gastroenterology

## 2020-06-23 VITALS — BP 148/66 | HR 62 | Temp 97.7°F | Resp 14 | Ht 73.5 in | Wt 198.0 lb

## 2020-06-23 DIAGNOSIS — D649 Anemia, unspecified: Secondary | ICD-10-CM | POA: Diagnosis not present

## 2020-06-23 DIAGNOSIS — D123 Benign neoplasm of transverse colon: Secondary | ICD-10-CM

## 2020-06-23 DIAGNOSIS — T184XXA Foreign body in colon, initial encounter: Secondary | ICD-10-CM | POA: Diagnosis not present

## 2020-06-23 DIAGNOSIS — K2970 Gastritis, unspecified, without bleeding: Secondary | ICD-10-CM | POA: Diagnosis not present

## 2020-06-23 DIAGNOSIS — Z85038 Personal history of other malignant neoplasm of large intestine: Secondary | ICD-10-CM

## 2020-06-23 DIAGNOSIS — R1013 Epigastric pain: Secondary | ICD-10-CM

## 2020-06-23 DIAGNOSIS — D122 Benign neoplasm of ascending colon: Secondary | ICD-10-CM

## 2020-06-23 DIAGNOSIS — J449 Chronic obstructive pulmonary disease, unspecified: Secondary | ICD-10-CM | POA: Diagnosis not present

## 2020-06-23 DIAGNOSIS — D120 Benign neoplasm of cecum: Secondary | ICD-10-CM | POA: Diagnosis not present

## 2020-06-23 DIAGNOSIS — K319 Disease of stomach and duodenum, unspecified: Secondary | ICD-10-CM | POA: Diagnosis not present

## 2020-06-23 DIAGNOSIS — D131 Benign neoplasm of stomach: Secondary | ICD-10-CM

## 2020-06-23 DIAGNOSIS — K317 Polyp of stomach and duodenum: Secondary | ICD-10-CM | POA: Diagnosis not present

## 2020-06-23 DIAGNOSIS — Z86010 Personal history of colonic polyps: Secondary | ICD-10-CM | POA: Diagnosis not present

## 2020-06-23 DIAGNOSIS — K2950 Unspecified chronic gastritis without bleeding: Secondary | ICD-10-CM | POA: Diagnosis not present

## 2020-06-23 DIAGNOSIS — R1010 Upper abdominal pain, unspecified: Secondary | ICD-10-CM | POA: Diagnosis not present

## 2020-06-23 LAB — IBC + FERRITIN
Ferritin: 126.5 ng/mL (ref 22.0–322.0)
Iron: 59 ug/dL (ref 42–165)
Saturation Ratios: 17.9 % — ABNORMAL LOW (ref 20.0–50.0)
Transferrin: 235 mg/dL (ref 212.0–360.0)

## 2020-06-23 LAB — CBC
HCT: 37.9 % — ABNORMAL LOW (ref 39.0–52.0)
Hemoglobin: 12.7 g/dL — ABNORMAL LOW (ref 13.0–17.0)
MCHC: 33.6 g/dL (ref 30.0–36.0)
MCV: 92.7 fl (ref 78.0–100.0)
Platelets: 260 10*3/uL (ref 150.0–400.0)
RBC: 4.09 Mil/uL — ABNORMAL LOW (ref 4.22–5.81)
RDW: 16.3 % — ABNORMAL HIGH (ref 11.5–15.5)
WBC: 4.8 10*3/uL (ref 4.0–10.5)

## 2020-06-23 LAB — VITAMIN B12: Vitamin B-12: 395 pg/mL (ref 211–911)

## 2020-06-23 LAB — FOLATE: Folate: >24.8 ng/mL (ref >5.9)

## 2020-06-23 MED ORDER — SODIUM CHLORIDE 0.9 % IV SOLN: 500.0000 mL | Freq: Once | INTRAVENOUS | Status: DC

## 2020-06-23 MED ORDER — OMEPRAZOLE 40 MG PO CPDR: 40.0000 mg | DELAYED_RELEASE_CAPSULE | Freq: Two times a day (BID) | ORAL | 3 refills | Status: DC

## 2020-06-23 NOTE — Progress Notes (Signed)
Called to room to assist during endoscopic procedure.  Patient ID and intended procedure confirmed with present staff. Received instructions for my participation in the procedure from the performing physician.  

## 2020-06-23 NOTE — Op Note (Signed)
[facility] Patient Name: Troy [name] Procedure Date: 06/23/2020 7:58 AM MRN: [id number] Endoscopist: Justice Britain , MD Age: 79 Referring MD:  Date of Birth: 1941/01/04 Gender: Male Account #: [id number] Procedure:                Upper GI endoscopy Indications:              Follow-up of intestinal metaplasia Medicines:                Monitored Anesthesia Care Procedure:                Pre-Anesthesia Assessment:                           - Prior to the procedure, a History and Physical                            was performed, and patient medications and                            allergies were reviewed. The patient's tolerance of                            previous anesthesia was also reviewed. The risks                            and benefits of the procedure and the sedation                            options and risks were discussed with the patient.                            All questions were answered, and informed consent                            was obtained. Prior Anticoagulants: The patient has                            taken Eliquis (apixaban), last dose was 2 days                            prior to procedure. ASA Grade Assessment: III - A                            patient with severe systemic disease. After                            reviewing the risks and benefits, the patient was                            deemed in satisfactory condition to undergo the                            procedure.  After obtaining informed consent, the endoscope was                            passed under direct vision. Throughout the                            procedure, the patient's blood pressure, pulse, and                            oxygen saturations were monitored continuously. The                            Endoscope was introduced through the mouth, and                            advanced to the second part of duodenum. The upper                             GI endoscopy was accomplished without difficulty.                            The patient tolerated the procedure. Scope In: Scope Out: Findings:                 No gross lesions were noted in the entire esophagus.                           The Z-line was regular and was found 40 cm from the                            incisors.                           Multiple 3 to 15 mm pedunculated and sessile polyps                            with bleeding were found in the gastric fundus, in                            the gastric body, at the incisura and in the                            gastric antrum. The largest polyp had evidence of                            active oozing. Preparations were made for mucosal                            resection. NBI imaging and White-light endoscopy                            was done to demarcate the borders of the lesion.  Saline was injected to raise the lesion. Snare                            mucosal resection was performed. Resection and                            retrieval were complete. To prevent bleeding after                            mucosal resection, two hemostatic clips were                            successfully placed (MR conditional). There was no                            bleeding during, or at the end, of the procedure.                           Striped mildly erythematous mucosa without bleeding                            was found in the gastric antrum.                           No other gross lesions were noted in the entire                            examined stomach. Due to history of Intestinal                            metaplasia, gastric mapping biopsies were                            performed. Biopsies were taken with a cold forceps                            for histology from the antrum. Biopsies were taken                            with a cold forceps for histology from the                             incisura. Biopsies were taken with a cold forceps                            for histology from the greater curve. Biopsies were                            taken with a cold forceps for histology from the                            lesser curve. Biopsies were taken with a cold  forceps for histology from the fundus/cardia.                           No gross lesions were noted in the duodenal bulb,                            in the first portion of the duodenum and in the                            second portion of the duodenum. Complications:            No immediate complications. Estimated Blood Loss:     Estimated blood loss was minimal. Impression:               - No gross lesions in esophagus. Z-line regular, 40                            cm from the incisors.                           - Multiple gastric polyps. 1 polyp with evidence of                            active oozing, resected via EMR and retrieved.                            Clips (MR conditional) were placed.                           - Erythematous mucosa in the antrum. No other gross                            lesions in the stomach. Gastric Mapping biopsies                            performed.                           - No gross lesions in the duodenal bulb, in the                            first portion of the duodenum and in the second                            portion of the duodenum. Recommendation:           - Proceed to scheduled colonoscopy.                           - Observe patient's clinical course.                           - Increase Omeprazole to 40 mg twice daily for 1  month and then may decrease back to 20 mg twice                            daily for 1 month. Then may decrease to effective                            dose for patient in regards to symptom relief but                            would maintain at least once  daily.                           - Obtain CBC/Iron/TIBC/Ferritin/Vitamin B12/Folate.                           - If patient has evidence of IDA, can proceed with                            scheduling EGD with EMR of multiple other polyps in                            effort of decreasing potential chronic blood loss                            anemia in hospital-based setting.                           - Repeat upper endoscopy in 3 years for                            surveillance likely in setting of GIM but will see                            how things look.                           - The findings and recommendations were discussed                            with the patient.                           - The findings and recommendations were discussed                            with the patient's family. Justice Britain, MD 06/23/2020 8:55:04 AM

## 2020-06-23 NOTE — Progress Notes (Signed)
0233 Ephedrine 10 mg given IV due to low BP, MD updated.

## 2020-06-23 NOTE — Progress Notes (Signed)
[facility] called and indicated that Dr. Rush Landmark would like the following labs placed for pt that he scoped this morning: cbc, IBC Ferritin, B12 and Folate. Labs entered with the Dx of anemia per Dr. Jerilynn Mages.

## 2020-06-23 NOTE — Progress Notes (Signed)
0758 Robinul 0.1 mg IV given due large amount of secretions upon assessment.  MD made aware, vss

## 2020-06-23 NOTE — Op Note (Signed)
[facility] Patient Name: Troy [name] Procedure Date: 06/23/2020 7:58 AM MRN: [id number] Endoscopist: Justice Britain , MD Age: 79 Referring MD:  Date of Birth: 1941/08/12 Gender: Male Account #: [id number] Procedure:                Colonoscopy Indications:              High risk colon cancer surveillance: Personal                            history of multiple (3 or more) adenomas, High risk                            colon cancer surveillance: Personal history of                            colon cancer Medicines:                Monitored Anesthesia Care Procedure:                Pre-Anesthesia Assessment:                           - Prior to the procedure, a History and Physical                            was performed, and patient medications and                            allergies were reviewed. The patient's tolerance of                            previous anesthesia was also reviewed. The risks                            and benefits of the procedure and the sedation                            options and risks were discussed with the patient.                            All questions were answered, and informed consent                            was obtained. Prior Anticoagulants: The patient has                            taken Eliquis (apixaban), last dose was 2 days                            prior to procedure. ASA Grade Assessment: III - A                            patient with severe systemic disease. After  reviewing the risks and benefits, the patient was                            deemed in satisfactory condition to undergo the                            procedure.                           After obtaining informed consent, the colonoscope                            was passed under direct vision. Throughout the                            procedure, the patient's blood pressure, pulse, and                            oxygen  saturations were monitored continuously. The                            Colonoscope was introduced through the anus and                            advanced to the 5 cm into the ileum. The                            colonoscopy was performed without difficulty. The                            patient tolerated the procedure. The quality of the                            bowel preparation was adequate. The terminal ileum,                            ileocecal valve, appendiceal orifice, and rectum                            were photographed. Scope In: 8:22:35 AM Scope Out: 8:40:44 AM Scope Withdrawal Time: 0 hours 15 minutes 19 seconds  Total Procedure Duration: 0 hours 18 minutes 9 seconds  Findings:                 The digital rectal exam findings include                            hemorrhoids. Pertinent negatives include no                            palpable rectal lesions.                           The terminal ileum and ileocecal valve appeared  normal.                           Seven sessile polyps were found in the transverse                            colon (1), ascending colon (3) and cecum (3). The                            polyps were 2 to 7 mm in size. These polyps were                            removed with a cold snare. Resection and retrieval                            were complete.                           Multiple small-mouthed diverticula were found in                            the recto-sigmoid colon and sigmoid colon.                           There was evidence of a prior end-to-end                            colo-colonic anastomosis in the                            rectosigmoid/sigmoid colon (20 cm from anus). This                            was patent and was characterized by visible                            sutures. The anastomosis was traversed. Removal of                            a suture was accomplished with a regular  forceps.                           Normal mucosa was found in the entire colon                            otherwise.                           Non-bleeding non-thrombosed external and internal                            hemorrhoids were found during retroflexion, during                            perianal exam and during digital exam. The  hemorrhoids were Grade III (internal hemorrhoids                            that prolapse but require manual reduction). Complications:            No immediate complications. Estimated Blood Loss:     Estimated blood loss was minimal. Impression:               - Hemorrhoids found on digital rectal exam.                           - The examined portion of the ileum was normal.                           - Seven 2 to 7 mm polyps in the transverse colon,                            in the ascending colon and in the cecum, removed                            with a cold snare. Resected and retrieved.                           - Diverticulosis in the recto-sigmoid colon and in                            the sigmoid colon.                           - Patent end-to-end colo-colonic anastomosis (at 20                            cm), characterized by visible sutures. Sutures                            removed.                           - Normal mucosa in the entire examined colon                            otherwise.                           - Non-bleeding non-thrombosed external and internal                            hemorrhoids. Recommendation:           - The patient will be observed post-procedure,                            until all discharge criteria are met.                           - Discharge patient to home.                           -  Patient has a contact number available for                            emergencies. The signs and symptoms of potential                            delayed complications were discussed with the                             patient. Return to normal activities tomorrow.                            Written discharge instructions were provided to the                            patient.                           - High fiber diet.                           - Continue present medications.                           - Use FiberCon 1-2 tablets PO daily.                           - Await pathology results.                           - Repeat colonoscopy in 3 years for surveillance.                           - May restart Eliquis in 72 hours (9/26 AM) in                            effort of decreasing bleeding risk from                            interventions performed today.                           - Minimize other NSAIDs as able for next 2-weeks.                           - The findings and recommendations were discussed                            with the patient.                           - The findings and recommendations were discussed                            with the patient's family. Justice Britain, MD 06/23/2020 9:02:07 AM

## 2020-06-23 NOTE — Patient Instructions (Signed)
YOU HAD AN ENDOSCOPIC PROCEDURE TODAY AT THE [name]:   Refer to the procedure report that was given to you for any specific questions about what was found during the examination.  If the procedure report does not answer your questions, please call your gastroenterologist to clarify.  If you requested that your care partner not be given the details of your procedure findings, then the procedure report has been included in a sealed envelope for you to review at your convenience later.  YOU SHOULD EXPECT: Some feelings of bloating in the abdomen. Passage of more gas than usual.  Walking can help get rid of the air that was put into your GI tract during the procedure and reduce the bloating. If you had a lower endoscopy (such as a colonoscopy or flexible sigmoidoscopy) you may notice spotting of blood in your stool or on the toilet paper. If you underwent a bowel prep for your procedure, you may not have a normal bowel movement for a few days.  Please Note:  You might notice some irritation and congestion in your nose or some drainage.  This is from the oxygen used during your procedure.  There is no need for concern and it should clear up in a day or so.  SYMPTOMS TO REPORT IMMEDIATELY:   Following lower endoscopy (colonoscopy or flexible sigmoidoscopy):  Excessive amounts of blood in the stool  Significant tenderness or worsening of abdominal pains  Swelling of the abdomen that is new, acute  Fever of 100F or higher   Following upper endoscopy (EGD)  Vomiting of blood or coffee ground material  New chest pain or pain under the shoulder blades  Painful or persistently difficult swallowing  New shortness of breath  Fever of 100F or higher  Black, tarry-looking stools  For urgent or emergent issues, a gastroenterologist can be reached at any hour by calling [phone]. Do not use MyChart messaging for urgent concerns.    DIET:  We do recommend a small meal at first, but  then you may proceed to your regular diet.  Drink plenty of fluids but you should avoid alcoholic beverages for 24 hours.  ACTIVITY:  You should plan to take it easy for the rest of today and you should NOT DRIVE or use heavy machinery until tomorrow (because of the sedation medicines used during the test).    FOLLOW UP: Our staff will call the number listed on your records 48-72 hours following your procedure to check on you and address any questions or concerns that you may have regarding the information given to you following your procedure. If we do not reach you, we will leave a message.  We will attempt to reach you two times.  During this call, we will ask if you have developed any symptoms of COVID 19. If you develop any symptoms (ie: fever, flu-like symptoms, shortness of breath, cough etc.) before then, please call [phone].  If you test positive for Covid 19 in the 2 weeks post procedure, please call and report this information to us.    If any biopsies were taken you will be contacted by phone or by letter within the next 1-3 weeks.  Please call us at [phone] if you have not heard about the biopsies in 3 weeks.    SIGNATURES/CONFIDENTIALITY: You and/or your care partner have signed paperwork which will be entered into your electronic medical record.  These signatures attest to the fact that that the information above on   your After Visit Summary has been reviewed and is understood.  Full responsibility of the confidentiality of this discharge information lies with you and/or your care-partner. 

## 2020-06-23 NOTE — Progress Notes (Signed)
VS- Dustin Flock RN  Pt's states no medical or surgical changes since previsit or office visit.  Last dose of Eliquis 9-21-2-1

## 2020-06-26 LAB — CUP PACEART REMOTE DEVICE CHECK
Date Time Interrogation Session: 20210922233510
Implantable Pulse Generator Implant Date: 20200709

## 2020-06-27 ENCOUNTER — Other Ambulatory Visit: Payer: Self-pay

## 2020-06-27 ENCOUNTER — Telehealth: Payer: Self-pay | Admitting: *Deleted

## 2020-06-27 ENCOUNTER — Ambulatory Visit (INDEPENDENT_AMBULATORY_CARE_PROVIDER_SITE_OTHER): Payer: PPO | Admitting: Emergency Medicine

## 2020-06-27 ENCOUNTER — Telehealth: Payer: Self-pay

## 2020-06-27 DIAGNOSIS — I480 Paroxysmal atrial fibrillation: Secondary | ICD-10-CM

## 2020-06-27 DIAGNOSIS — D649 Anemia, unspecified: Secondary | ICD-10-CM

## 2020-06-27 NOTE — Telephone Encounter (Signed)
Left message on answering machine. 

## 2020-06-27 NOTE — Telephone Encounter (Signed)
Second attempt, left VM.  

## 2020-06-30 NOTE — Progress Notes (Signed)
Carelink Summary Report / Loop Recorder 

## 2020-07-01 ENCOUNTER — Encounter: Payer: Self-pay | Admitting: Gastroenterology

## 2020-07-06 ENCOUNTER — Telehealth (INDEPENDENT_AMBULATORY_CARE_PROVIDER_SITE_OTHER): Payer: PPO | Admitting: Adult Health

## 2020-07-06 DIAGNOSIS — K529 Noninfective gastroenteritis and colitis, unspecified: Secondary | ICD-10-CM

## 2020-07-06 NOTE — Progress Notes (Signed)
Virtual Visit via Video Note  I connected with Troy [name] on 07/06/20 at 10:30 AM EDT by a video enabled telemedicine application and verified that I am speaking with the correct person using two identifiers.  Location patient: home Location provider:work or home office Persons participating in the virtual visit: patient, provider, daughter  I discussed the limitations of evaluation and management by telemedicine and the availability of in person appointments. The patient expressed understanding and agreed to proceed.   HPI: A 79 year old male who is being evaluated today for an acute issue.  His symptoms started 4 days ago.  Symptoms include diarrhea, two episodes of vomiting, fatigue, and headache.  He reports that over the last 24 hours he started to feel better is no longer having diarrhea, and has not had vomiting episodes in the last 2 day.  Denies fevers, chills, abdominal pain, cramping, blood in stool or vomit during his illness.  When he was feeling bad he stopped his medications but has subsequently restarted them yesterday.  His daughters have been keeping an eye on him and then bringing him Pedialyte and propelled to stay hydrated.  Believes that he got sick from a sub sandwhich that he left on the counter for roughly 6 hours before eating   ROS: See pertinent positives and negatives per HPI.  Past Medical History:  Diagnosis Date   AAA (abdominal aortic aneurysm) ([place]) LAST ABDOMINAL US 10-20-17 3.3 CM   MONITORED BY DR [name]   Allergy    Anxiety    Arthritis    Atrial fibrillation (HCC)    Basal cell carcinoma    Bilateral carotid artery stenosis DUPLEX 12-29-2012  BY DR [facility]   BILATERAL ICA STENOSIS 60-79%   BPH (benign prostatic hypertrophy)    Cataract    removed both eyes   Chronic diastolic heart failure (HCC)    Chronic kidney disease    stage 3, pt unaware   Colon cancer ([place])    Complication of anesthesia    had nausea after carotid artery  surgery, states the medicine given to help the nausea, made it worse   Constipation    COPD (chronic obstructive pulmonary disease) (HCC)    Depression    Emphysema of lung (HCC)    GERD (gastroesophageal reflux disease)    Glaucoma BOTH EYES   Dr Gershon Crane   History of basal cell carcinoma excision    History of lung cancer APRIL 2012  SQUAMOUS CELL---- S/P RIGHT LOWER LOBECTOMY AT [facility] --  NO CHEMORADIATION---  NO RECURRENCE    ONCOLOGIST- DR Tressie Stalker  LOV IN [facility] 10-27-2012   History of pneumothorax    pt unaware   Hx of adenomatous colonic polyps 2005    X 2; 1 hyperplastic polyp; Dr Olevia Perches   Hyperlipidemia    Hypertension    Impaired fasting glucose 2007   108; A1c5.4%   Lesion of bladder    Lung cancer ([place]) 01/25/2012   Microhematuria    OSA (obstructive sleep apnea) 08/29/2015   CPAP SET ON 10   PAD (peripheral artery disease) ([place]) ABI'S  JAN 2014  0.65 ON RIGHT ;  1.04 ON LEFT   Peripheral vascular disease (HCC) S/P ANGIOPLASTY AND STENTING   FOLLOWED  BY DR [name]   Pleural effusion on right 11/18/2018   Chronic, noted on CXR   Sleep apnea    + cpap    Spinal stenosis Sept. 2015   Status post placement of implantable loop recorder    Stroke (  [place]) Jul 07, 2013   mini  TIA   Thoracic aorta atherosclerosis (HCC)    Thyrotoxicosis    amiodarone induced   Urgency of urination     Past Surgical History:  Procedure Laterality Date   ANGIO PLASTY     X 4 in legs   AORTOGRAM  07-27-2002   MILD DIFFUSE ILIAC ARTERY OCCLUSIVE DISEASE /  LEFT RENAL ARTERY 20%/ PATENT LEFT FEM-POP GRAFT/ MILD SFA AND POPLITEAL ARTERY OCCLUSIVE DISEASE W/ SEVERE KIDNEY OCCLUSIVE DISEASE   ATRIAL FIBRILLATION ABLATION N/A 04/09/2019   Procedure: ATRIAL FIBRILLATION ABLATION;  Surgeon: Thompson Grayer, MD;  Location: [place] [facility];  Service: Cardiovascular;  Laterality: N/A;   ATRIAL FIBRILLATION ABLATION N/A 02/12/2020   Procedure: ATRIAL FIBRILLATION  ABLATION;  Surgeon: Thompson Grayer, MD;  Location: [place] [facility];  Service: Cardiovascular;  Laterality: N/A;   BASAL CELL CARCINOMA EXCISION     MULTIPLE TIMES--  RIGHT FOREARM, CHEEKS, AND BACK   BIOPSY  06/25/2018   Procedure: BIOPSY;  Surgeon: Irving Copas., MD;  Location: [place];  Service: Gastroenterology;;   CARDIOVASCULAR STRESS TEST  12-08-2012  DR [facility]   NORMAL LEXISCAN WITH NO EXERCISE NUCLEAR STUDY/ EF 66%/   NO ISCHEMIA/ NO SIGNIFICANT CHANGE FROM PRIOR STUDY   CARDIOVERSION N/A 02/14/2018   Procedure: CARDIOVERSION;  Surgeon: Larey Dresser, MD;  Location: [place];  Service: Cardiovascular;  Laterality: N/A;   CAROTID ANGIOGRAM N/A 07/10/2013   Procedure: CAROTID ANGIOGRAM;  Surgeon: Elam Dutch, MD;  Location: [facility] CATH LAB;  Service: Cardiovascular;  Laterality: N/A;   CAROTID ENDARTERECTOMY Right 07-14-13   cea   CATARACT EXTRACTION W/ INTRAOCULAR LENS  IMPLANT, BILATERAL     colon polyectomy     COLONOSCOPY     COLONOSCOPY WITH PROPOFOL N/A 06/25/2018   Procedure: COLONOSCOPY WITH PROPOFOL;  Surgeon: Rush Landmark Telford Nab., MD;  Location: [place];  Service: Gastroenterology;  Laterality: N/A;   CYSTOSCOPY W/ RETROGRADES Bilateral 01/21/2013   Procedure: CYSTOSCOPY WITH RETROGRADE PYELOGRAM;  Surgeon: Molli Hazard, MD;  Location: [facility];  Service: Urology;  Laterality: Bilateral;   CYSTO, BLADDER BIOPSY, BILATERAL RETROGRADE PYELOGRAM  RAD TECH FROM RADIOLOGY PER JOY   CYSTOSCOPY WITH BIOPSY N/A 01/21/2013   Procedure: CYSTOSCOPY WITH BIOPSY;  Surgeon: Molli Hazard, MD;  Location: [facility];  Service: Urology;  Laterality: N/A;   ENDARTERECTOMY Right 07/14/2013   Procedure: ENDARTERECTOMY CAROTID;  Surgeon: Angelia Mould, MD;  Location: [place];  Service: Vascular;  Laterality: Right;   EP IMPLANTABLE DEVICE N/A 08/12/2015   Procedure: Loop Recorder Insertion;   Surgeon: Thompson Grayer, MD;  Location: [place] [facility];  Service: Cardiovascular;  Laterality: N/A;   ESOPHAGOGASTRODUODENOSCOPY (EGD) WITH PROPOFOL N/A 06/25/2018   Procedure: ESOPHAGOGASTRODUODENOSCOPY (EGD) WITH PROPOFOL;  Surgeon: Rush Landmark Telford Nab., MD;  Location: [place];  Service: Gastroenterology;  Laterality: N/A;   ESOPHAGOGASTRODUODENOSCOPY (EGD) WITH PROPOFOL N/A 07/10/2018   Procedure: ESOPHAGOGASTRODUODENOSCOPY (EGD) WITH PROPOFOL;  Surgeon: Milus Banister, MD;  Location: [facility];  Service: Endoscopy;  Laterality: N/A;   EUS N/A 07/10/2018   Procedure: UPPER ENDOSCOPIC ULTRASOUND (EUS) RADIAL;  Surgeon: Milus Banister, MD;  Location: [facility];  Service: Endoscopy;  Laterality: N/A;   EYE SURGERY Right    FEMORAL-POPLITEAL BYPASS GRAFT Left 1994  [facility]   AND 2001  IN [place]   FEMORAL-POPLITEAL BYPASS GRAFT Left 08/30/2015   Procedure: REVISION OF BYPASS GRAFT Left  FEMORAL-POPLITEAL ARTERY;  Surgeon: Angelia Mould, MD;  Location: [place];  Service: Vascular;  Laterality: Left;   FINE NEEDLE ASPIRATION N/A 07/10/2018   Procedure: FINE NEEDLE ASPIRATION (FNA) LINEAR;  Surgeon: Milus Banister, MD;  Location: [facility];  Service: Endoscopy;  Laterality: N/A;   FLEXIBLE SIGMOIDOSCOPY N/A 12/12/2018   Procedure: FLEXIBLE SIGMOIDOSCOPY;  Surgeon: Ileana Roup, MD;  Location: [facility];  Service: General;  Laterality: N/A;   LARYNGOSCOPY  06-27-2004   BX VOCAL CORD  (LEUKOPLAKIA)  PER PT NO ISSUES SINCE   LEFT HEART CATH AND CORONARY ANGIOGRAPHY N/A 08/20/2017   Procedure: LEFT HEART CATH AND CORONARY ANGIOGRAPHY;  Surgeon: Larey Dresser, MD;  Location: [place] [facility];  Service: Cardiovascular;  Laterality: N/A;   LOOP RECORDER INSERTION N/A 04/09/2019   Procedure: LOOP RECORDER INSERTION;  Surgeon: Thompson Grayer, MD;  Location: [place] [facility];  Service: Cardiovascular;  Laterality: N/A;   LOOP RECORDER REMOVAL N/A 04/09/2019    Procedure: LOOP RECORDER REMOVAL;  Surgeon: Thompson Grayer, MD;  Location: [place] [facility];  Service: Cardiovascular;  Laterality: N/A;   LOWER EXTREMITY ANGIOGRAM Bilateral 08/29/2015   Procedure: Lower Extremity Angiogram;  Surgeon: Angelia Mould, MD;  Location: [place] [facility];  Service: Cardiovascular;  Laterality: Bilateral;   LUNG LOBECTOMY  01/24/2011    RIGHT UPPER LOBE  (SQUAMOUS CELL CARCINOMA) Dr Dorthea Cove , [facility]. No chemotherapyor radiation   PATCH ANGIOPLASTY Right 07/14/2013   Procedure: PATCH ANGIOPLASTY;  Surgeon: Angelia Mould, MD;  Location: [place];  Service: Vascular;  Laterality: Right;   PATCH ANGIOPLASTY Left 08/30/2015   Procedure: VEIN PATCH ANGIOPLASTY OF PROXIMAL Left BYPASS GRAFT;  Surgeon: Angelia Mould, MD;  Location: [place];  Service: Vascular;  Laterality: Left;   PERIPHERAL VASCULAR CATHETERIZATION N/A 08/29/2015   Procedure: Abdominal Aortogram;  Surgeon: Angelia Mould, MD;  Location: [place] [facility];  Service: Cardiovascular;  Laterality: N/A;   POLYPECTOMY  06/25/2018   Procedure: POLYPECTOMY;  Surgeon: Rush Landmark Telford Nab., MD;  Location: [place];  Service: Gastroenterology;;   POLYPECTOMY     SUBMUCOSAL INJECTION  06/25/2018   Procedure: SUBMUCOSAL INJECTION;  Surgeon: Irving Copas., MD;  Location: [place];  Service: Gastroenterology;;   TEE WITHOUT CARDIOVERSION N/A 02/14/2018   Procedure: TRANSESOPHAGEAL ECHOCARDIOGRAM (TEE);  Surgeon: Larey Dresser, MD;  Location: [facility] ENDOSCOPY;  Service: Cardiovascular;  Laterality: N/A;   trabecular surgery     OS   TRANSTHORACIC ECHOCARDIOGRAM  12-29-2012  DR [facility]   MILD LVH/  LVSF NORMAL/ EF 51-88%/  GRADE I DIASTOLIC DYSFUNCTION    Family History  Problem Relation Age of Onset   Stroke Mother        mini strokes   Alcohol abuse Father    Heart disease Father        MI after 54   Stroke Father    Hypertension Father    Heart  attack Father    Heart disease Paternal Aunt        several   Hypertension Paternal Aunt        several   Stroke Paternal Aunt        several   Stroke Paternal Uncle        several   Heart disease Paternal Uncle        several;2 had MI pre 26   Cancer Daughter 34       breast ca, also with benign sessile polyp    Colon  cancer Daughter 69   Colon polyps Neg Hx    Esophageal cancer Neg Hx    Rectal cancer Neg Hx    Stomach cancer Neg Hx        Current Outpatient Medications:    amLODipine (NORVASC) 5 MG tablet, Take 1 tablet (5 mg total) by mouth daily., Disp: 30 tablet, Rfl: 6   apixaban (ELIQUIS) 5 MG TABS tablet, Take 1 tablet (5 mg total) by mouth 2 (two) times daily., Disp: 60 tablet, Rfl: 6   Ascorbic Acid (VITAMIN C PO), Take 500 mg by mouth in the morning and at bedtime. , Disp: , Rfl:    Biotin 2500 MCG CAPS, Take 2,500 mcg by mouth daily., Disp: , Rfl:    Brinzolamide-Brimonidine (SIMBRINZA) 1-0.2 % SUSP, Place 1 drop into both eyes 2 times daily., Disp: , Rfl:    budesonide-formoterol (SYMBICORT) 160-4.5 MCG/ACT inhaler, Inhale 2 puffs into the lungs 2 (two) times daily., Disp: , Rfl:    docusate sodium (COLACE) 100 MG capsule, Take 100 mg by mouth 2 (two) times daily. , Disp: , Rfl:    escitalopram (LEXAPRO) 10 MG tablet, Take 1 tablet (10 mg total) by mouth daily., Disp: 30 tablet, Rfl: 5   ferrous sulfate 325 (65 FE) MG tablet, Take 325 mg by mouth 2 (two) times daily with a meal. , Disp: , Rfl:    FLUoxetine (PROZAC) 20 MG tablet, Take 1 tablet (20 mg total) by mouth daily., Disp: 14 tablet, Rfl: 0   furosemide (LASIX) 40 MG tablet, Take 1 tablet (40 mg total) by mouth 2 (two) times daily., Disp: 60 tablet, Rfl: 3   furosemide (LASIX) 40 MG tablet, Take 1 tablet (40 mg total) by mouth daily., Disp: 90 tablet, Rfl: 3   latanoprost (XALATAN) 0.005 % ophthalmic solution, , Disp: , Rfl:    losartan (COZAAR) 25 MG tablet, TAKE (2) TABLETS BY MOUTH  DAILY., Disp: 60 tablet, Rfl: 5   Magnesium 500 MG CAPS, Take 500 mg by mouth daily. , Disp: , Rfl:    Melatonin 5 MG TABS, Take 5 mg by mouth at bedtime., Disp: , Rfl:    metoprolol succinate (TOPROL-XL) 25 MG 24 hr tablet, Take 0.5 tablets (12.5 mg total) by mouth at bedtime., Disp: 15 tablet, Rfl: 6   montelukast (SINGULAIR) 10 MG tablet, TAKE ONE TABLET AT BEDTIME., Disp: 30 tablet, Rfl: 5   Multiple Vitamins-Minerals (CENTRUM SILVER PO), Take 1 tablet by mouth daily., Disp: , Rfl:    Multiple Vitamins-Minerals (ZINC PO), Take 1 tablet by mouth daily., Disp: , Rfl:    omeprazole (PRILOSEC) 20 MG capsule, Take 1 capsule (20 mg total) by mouth 2 (two) times daily as needed. Take 30- 60 min before your first and last meals of the day, Disp: 180 capsule, Rfl: 1   omeprazole (PRILOSEC) 40 MG capsule, Take 1 capsule (40 mg total) by mouth in the morning and at bedtime. 40 mg twice daily for 30 days and then drop down to 20 mg twice daily, Disp: 90 capsule, Rfl: 3   PEG-KCl-NaCl-NaSulf-Na Asc-C (PLENVU) 140 g SOLR, Take 1 kit by mouth as directed. Pt has a manufacturers coupon to bring to  Pharmacy, Disp: 1 each, Rfl: 0   Polyethyl Glycol-Propyl Glycol (SYSTANE) 0.4-0.3 % SOLN, Place 1 drop into both eyes daily as needed (for dry eyes). , Disp: , Rfl:    potassium chloride SA (KLOR-CON) 20 MEQ tablet, Take 1 tablet (20 mEq total) by mouth daily., Disp: 30  tablet, Rfl: 6   rosuvastatin (CRESTOR) 40 MG tablet, Take 1 tablet (40 mg total) by mouth daily., Disp: 30 tablet, Rfl: 6   senna (SENOKOT) 8.6 MG tablet, Take 1 tablet by mouth 2 (two) times daily., Disp: , Rfl:   EXAM:  VITALS per patient if applicable:  GENERAL: alert, oriented, appears well and in no acute distress  HEENT: atraumatic, conjunttiva clear, no obvious abnormalities on inspection of external nose and ears  NECK: normal movements of the head and neck  LUNGS: on inspection no signs of respiratory distress, breathing  rate appears normal, no obvious gross SOB, gasping or wheezing  CV: no obvious cyanosis  MS: moves all visible extremities without noticeable abnormality  PSYCH/NEURO: pleasant and cooperative, no obvious depression or anxiety, speech and thought processing grossly intact  ASSESSMENT AND PLAN:  Discussed the following assessment and plan:  1. Gastroenteritis -He is improving thankfully.  And he has restarted all of his medications.  Advised that he can take Pepto for the next few days, brat diet, rest and stay hydrated.  He should be back to his normal self within 2 to 3 days.  His symptoms worsen then please follow-up with myself or his PCP  Dorothyann Peng, NP        I discussed the assessment and treatment plan with the patient. The patient was provided an opportunity to ask questions and all were answered. The patient agreed with the plan and demonstrated an understanding of the instructions.   The patient was advised to call back or seek an in-person evaluation if the symptoms worsen or if the condition fails to improve as anticipated.   Dorothyann Peng, NP

## 2020-07-14 ENCOUNTER — Telehealth: Payer: Self-pay | Admitting: Oncology

## 2020-07-14 DIAGNOSIS — H401133 Primary open-angle glaucoma, bilateral, severe stage: Secondary | ICD-10-CM | POA: Diagnosis not present

## 2020-07-14 DIAGNOSIS — H353130 Nonexudative age-related macular degeneration, bilateral, stage unspecified: Secondary | ICD-10-CM | POA: Diagnosis not present

## 2020-07-14 NOTE — Telephone Encounter (Signed)
R/s appt per 10/14 sch msg - unable to reach pt . Left message for pt wife with new appt date and time

## 2020-07-15 ENCOUNTER — Other Ambulatory Visit: Payer: PPO

## 2020-07-15 ENCOUNTER — Ambulatory Visit: Payer: PPO | Admitting: Oncology

## 2020-07-15 ENCOUNTER — Ambulatory Visit: Payer: PPO | Admitting: Nurse Practitioner

## 2020-07-27 ENCOUNTER — Telehealth: Payer: Self-pay | Admitting: Internal Medicine

## 2020-07-27 NOTE — Telephone Encounter (Signed)
LVM for pt to rtn my call to schedule AWV-I with [facility]. If patient calls the office please schedule AWV-I with [facility]  °

## 2020-07-30 LAB — CUP PACEART REMOTE DEVICE CHECK
Date Time Interrogation Session: 20211026000120
Implantable Pulse Generator Implant Date: 20200709

## 2020-08-01 ENCOUNTER — Ambulatory Visit (INDEPENDENT_AMBULATORY_CARE_PROVIDER_SITE_OTHER): Payer: PPO

## 2020-08-01 DIAGNOSIS — I700 Atherosclerosis of aorta: Secondary | ICD-10-CM | POA: Insufficient documentation

## 2020-08-01 DIAGNOSIS — I480 Paroxysmal atrial fibrillation: Secondary | ICD-10-CM | POA: Diagnosis not present

## 2020-08-01 NOTE — Progress Notes (Signed)
Subjective:    Patient ID: Troy Kindred [name], male    DOB: [birth year], 79 y.o.   MRN: [id number]  HPI The patient is here for follow up of their chronic medical problems, including depression  Three months ago he tapered off the prozac and started lexapro.  Overall he feels good.  He denies any depression or anxiety.  He does miss his wife.  He had an IBS flare and was advised to take pepto.  That has resolved and his appetite has improved.  He did lose weight but has gained it back.    He had a colonoscopy and EGD.   He is exercising 3/ week with a PT.  He walks during the day.    Medications and allergies reviewed with patient and updated if appropriate.  Patient Active Problem List   Diagnosis Date Noted  . Aortic atherosclerosis ([place]) 08/01/2020  . Secondary hypercoagulable state ([place]) 03/22/2020  . History of colon cancer 01/08/2020  . Epigastric pain 01/08/2020  . Abnormal findings on esophagogastroduodenoscopy (EGD) 01/08/2020  . Calculus of gallbladder without cholecystitis without obstruction 01/08/2020  . Vertigo 11/25/2019  . Cancer of sigmoid colon ([place]) 12/12/2018  . Pleural effusion on right 07/15/2018  . Disease of lymph node   . Colon adenocarcinoma ([place]) 06/30/2018  . Acute GI bleeding 06/23/2018  . Depression 05/06/2018  . Syncope 01/29/2018  . Chronic diastolic CHF (congestive heart failure) ([place]) 05/04/2017  . CAD (coronary artery disease) 05/03/2017  . MGUS (monoclonal gammopathy of unknown significance) 03/07/2016  . Benign essential HTN 01/19/2016  . OSA (obstructive sleep apnea) 08/29/2015  . PAF (paroxysmal atrial fibrillation) ([place])   . Glaucoma 06/22/2015  . DOE (dyspnea on exertion) 02/04/2015  . Memory loss, short term 08/03/2014  . Anemia, unspecified 12/28/2013  . Occlusion and stenosis of carotid artery without mention of cerebral infarction 07/09/2013  . COPD GOLD II  07/09/2013  . Chronic renal disease, stage 3, moderately  decreased glomerular filtration rate between 30-59 mL/min/1.73 square meter (HCC) 07/06/2013  . TIA (transient ischemic attack) 07/06/2013  . Basal cell carcinoma of skin 10/10/2012  . History of lung cancer 03/11/2012  . PAD (peripheral artery disease) ([place]) 12/16/2011  . AAA (abdominal aortic aneurysm) ([employer]) 09/27/2011  . Squamous cell lung cancer ([place]) 11/08/2010  . GAIT IMBALANCE 03/02/2009  . Prediabetes 03/02/2009  . History of colonic polyps 03/02/2009  . OBSTRUCTIVE CHRONIC BRONCHITIS WITHOUT EXACERBAT 01/15/2008  . HYPERPLASIA PROSTATE UNS W/O UR OBST & OTH LUTS 01/15/2008  . HYPERLIPIDEMIA 09/29/2007  . Peripheral vascular disease ([place]) 09/29/2007  . LEUKOPLAKIA, VOCAL CORDS 09/29/2007  . FEMORAL BRUIT 09/29/2007    Current Outpatient Medications on File Prior to Visit  Medication Sig Dispense Refill  . amLODipine (NORVASC) 5 MG tablet Take 1 tablet (5 mg total) by mouth daily. 30 tablet 6  . apixaban (ELIQUIS) 5 MG TABS tablet Take 1 tablet (5 mg total) by mouth 2 (two) times daily. 60 tablet 6  . Ascorbic Acid (VITAMIN C PO) Take 500 mg by mouth in the morning and at bedtime.     . Biotin 2500 MCG CAPS Take 2,500 mcg by mouth daily.    . Brinzolamide-Brimonidine (SIMBRINZA) 1-0.2 % SUSP Place 1 drop into both eyes 2 times daily.    . budesonide-formoterol (SYMBICORT) 160-4.5 MCG/ACT inhaler Inhale 2 puffs into the lungs 2 (two) times daily.    [name] docusate sodium (COLACE) 100 MG capsule Take 100 mg by mouth 2 (two) times daily.     [name]  escitalopram (LEXAPRO) 10 MG tablet Take 1 tablet (10 mg total) by mouth daily. 30 tablet 5  . ferrous sulfate 325 (65 FE) MG tablet Take 325 mg by mouth 2 (two) times daily with a meal.     . furosemide (LASIX) 40 MG tablet Take 1 tablet (40 mg total) by mouth 2 (two) times daily. 60 tablet 3  . furosemide (LASIX) 40 MG tablet Take 1 tablet (40 mg total) by mouth daily. 90 tablet 3  . latanoprost (XALATAN) 0.005 % ophthalmic solution     .  losartan (COZAAR) 25 MG tablet TAKE (2) TABLETS BY MOUTH DAILY. 60 tablet 5  . Magnesium 500 MG CAPS Take 500 mg by mouth daily.     . Melatonin 5 MG TABS Take 5 mg by mouth at bedtime.    . metoprolol succinate (TOPROL-XL) 25 MG 24 hr tablet Take 0.5 tablets (12.5 mg total) by mouth at bedtime. 15 tablet 6  . montelukast (SINGULAIR) 10 MG tablet TAKE ONE TABLET AT BEDTIME. 30 tablet 5  . Multiple Vitamins-Minerals (CENTRUM SILVER PO) Take 1 tablet by mouth daily.    . Multiple Vitamins-Minerals (ZINC PO) Take 1 tablet by mouth daily.    [name] omeprazole (PRILOSEC) 20 MG capsule Take 1 capsule (20 mg total) by mouth 2 (two) times daily as needed. Take 30- 60 min before your first and last meals of the day 180 capsule 1  . omeprazole (PRILOSEC) 40 MG capsule Take 1 capsule (40 mg total) by mouth in the morning and at bedtime. 40 mg twice daily for 30 days and then drop down to 20 mg twice daily 90 capsule 3  . PEG-KCl-NaCl-NaSulf-Na Asc-C (PLENVU) 140 g SOLR Take 1 kit by mouth as directed. Pt has a manufacturers coupon to bring to  Pharmacy 1 each 0  . Polyethyl Glycol-Propyl Glycol (SYSTANE) 0.4-0.3 % SOLN Place 1 drop into both eyes daily as needed (for dry eyes).     . potassium chloride SA (KLOR-CON) 20 MEQ tablet Take 1 tablet (20 mEq total) by mouth daily. 30 tablet 6  . rosuvastatin (CRESTOR) 40 MG tablet Take 1 tablet (40 mg total) by mouth daily. 30 tablet 6  . senna (SENOKOT) 8.6 MG tablet Take 1 tablet by mouth 2 (two) times daily.     No current facility-administered medications on file prior to visit.    Past Medical History:  Diagnosis Date  . AAA (abdominal aortic aneurysm) ([place]) LAST ABDOMINAL US 10-20-17 3.3 CM   MONITORED BY DR [name]  . Allergy   . Anxiety   . Arthritis   . Atrial fibrillation ([place])   . Basal cell carcinoma   . Bilateral carotid artery stenosis DUPLEX 12-29-2012  BY DR [facility]   BILATERAL ICA STENOSIS 60-79%  . BPH (benign prostatic hypertrophy)   .  Cataract    removed both eyes  . Chronic diastolic heart failure ([place])   . Chronic kidney disease    stage 3, pt unaware  . Colon cancer ([place])   . Complication of anesthesia    had nausea after carotid artery surgery, states the medicine given to help the nausea, made it worse  . Constipation   . COPD (chronic obstructive pulmonary disease) ([place])   . Depression   . Emphysema of lung ([place])   . GERD (gastroesophageal reflux disease)   . Glaucoma BOTH EYES   Dr Gershon Crane  . History of basal cell carcinoma excision   . History of lung cancer APRIL 2012  SQUAMOUS CELL---- S/P RIGHT LOWER LOBECTOMY AT [facility] --  NO CHEMORADIATION---  NO RECURRENCE    ONCOLOGIST- DR Tressie Stalker  LOV IN [facility] 10-27-2012  . History of pneumothorax    pt unaware  . Hx of adenomatous colonic polyps 2005    X 2; 1 hyperplastic polyp; Dr Olevia Perches  . Hyperlipidemia   . Hypertension   . Impaired fasting glucose 2007   108; A1c5.4%  . Lesion of bladder   . Lung cancer ([place]) 01/25/2012  . Microhematuria   . OSA (obstructive sleep apnea) 08/29/2015   CPAP SET ON 10  . PAD (peripheral artery disease) ([place]) ABI'S  JAN 2014  0.65 ON RIGHT ;  1.04 ON LEFT  . Peripheral vascular disease ([place]) S/P ANGIOPLASTY AND STENTING   FOLLOWED  BY DR [name]  . Pleural effusion on right 11/18/2018   Chronic, noted on CXR  . Sleep apnea    + cpap   . Spinal stenosis Sept. 2015  . Status post placement of implantable loop recorder   . Stroke [facility]) Jul 07, 2013   mini  TIA  . Thoracic aorta atherosclerosis ([name])   . Thyrotoxicosis    amiodarone induced  . Urgency of urination     Past Surgical History:  Procedure Laterality Date  . ANGIO PLASTY     X 4 in legs  . AORTOGRAM  07-27-2002   MILD DIFFUSE ILIAC ARTERY OCCLUSIVE DISEASE /  LEFT RENAL ARTERY 20%/ PATENT LEFT FEM-POP GRAFT/ MILD SFA AND POPLITEAL ARTERY OCCLUSIVE DISEASE W/ SEVERE KIDNEY OCCLUSIVE DISEASE  . ATRIAL FIBRILLATION ABLATION N/A 04/09/2019   Procedure:  ATRIAL FIBRILLATION ABLATION;  Surgeon: Thompson Grayer, MD;  Location: [place] CV LAB;  Service: Cardiovascular;  Laterality: N/A;  . ATRIAL FIBRILLATION ABLATION N/A 02/12/2020   Procedure: ATRIAL FIBRILLATION ABLATION;  Surgeon: Thompson Grayer, MD;  Location: [place] CV LAB;  Service: Cardiovascular;  Laterality: N/A;  . BASAL CELL CARCINOMA EXCISION     MULTIPLE TIMES--  RIGHT FOREARM, CHEEKS, AND BACK  . BIOPSY  06/25/2018   Procedure: BIOPSY;  Surgeon: Rush Landmark Telford Nab., MD;  Location: [place];  Service: Gastroenterology;;  . CARDIOVASCULAR STRESS TEST  12-08-2012  DR MCLEAN   NORMAL LEXISCAN WITH NO EXERCISE NUCLEAR STUDY/ EF 66%/   NO ISCHEMIA/ NO SIGNIFICANT CHANGE FROM PRIOR STUDY  . CARDIOVERSION N/A 02/14/2018   Procedure: CARDIOVERSION;  Surgeon: Larey Dresser, MD;  Location: [facility] ENDOSCOPY;  Service: Cardiovascular;  Laterality: N/A;  . CAROTID ANGIOGRAM N/A 07/10/2013   Procedure: CAROTID ANGIOGRAM;  Surgeon: Elam Dutch, MD;  Location: [facility] CATH LAB;  Service: Cardiovascular;  Laterality: N/A;  . CAROTID ENDARTERECTOMY Right 07-14-13   cea  . CATARACT EXTRACTION W/ INTRAOCULAR LENS  IMPLANT, BILATERAL    . colon polyectomy    . COLONOSCOPY    . COLONOSCOPY WITH PROPOFOL N/A 06/25/2018   Procedure: COLONOSCOPY WITH PROPOFOL;  Surgeon: Rush Landmark Telford Nab., MD;  Location: [place];  Service: Gastroenterology;  Laterality: N/A;  . CYSTOSCOPY W/ RETROGRADES Bilateral 01/21/2013   Procedure: CYSTOSCOPY WITH RETROGRADE PYELOGRAM;  Surgeon: Molli Hazard, MD;  Location: [facility];  Service: Urology;  Laterality: Bilateral;   CYSTO, BLADDER BIOPSY, BILATERAL RETROGRADE PYELOGRAM  RAD TECH FROM RADIOLOGY PER JOY  . CYSTOSCOPY WITH BIOPSY N/A 01/21/2013   Procedure: CYSTOSCOPY WITH BIOPSY;  Surgeon: Molli Hazard, MD;  Location: [facility];  Service: Urology;  Laterality: N/A;  . ENDARTERECTOMY Right 07/14/2013    Procedure:  ENDARTERECTOMY CAROTID;  Surgeon: Angelia Mould, MD;  Location: [place];  Service: Vascular;  Laterality: Right;  . EP IMPLANTABLE DEVICE N/A 08/12/2015   Procedure: Loop Recorder Insertion;  Surgeon: Thompson Grayer, MD;  Location: [place] CV LAB;  Service: Cardiovascular;  Laterality: N/A;  . ESOPHAGOGASTRODUODENOSCOPY (EGD) WITH PROPOFOL N/A 06/25/2018   Procedure: ESOPHAGOGASTRODUODENOSCOPY (EGD) WITH PROPOFOL;  Surgeon: Rush Landmark Telford Nab., MD;  Location: [place];  Service: Gastroenterology;  Laterality: N/A;  . ESOPHAGOGASTRODUODENOSCOPY (EGD) WITH PROPOFOL N/A 07/10/2018   Procedure: ESOPHAGOGASTRODUODENOSCOPY (EGD) WITH PROPOFOL;  Surgeon: Milus Banister, MD;  Location: [facility] ENDOSCOPY;  Service: Endoscopy;  Laterality: N/A;  . EUS N/A 07/10/2018   Procedure: UPPER ENDOSCOPIC ULTRASOUND (EUS) RADIAL;  Surgeon: Milus Banister, MD;  Location: [facility] ENDOSCOPY;  Service: Endoscopy;  Laterality: N/A;  . EYE SURGERY Right   . FEMORAL-POPLITEAL BYPASS GRAFT Left [facility]   AND 2001  IN [place]  . FEMORAL-POPLITEAL BYPASS GRAFT Left 08/30/2015   Procedure: REVISION OF BYPASS GRAFT Left  FEMORAL-POPLITEAL ARTERY;  Surgeon: Angelia Mould, MD;  Location: [place];  Service: Vascular;  Laterality: Left;  . FINE NEEDLE ASPIRATION N/A 07/10/2018   Procedure: FINE NEEDLE ASPIRATION (FNA) LINEAR;  Surgeon: Milus Banister, MD;  Location: [facility] ENDOSCOPY;  Service: Endoscopy;  Laterality: N/A;  . FLEXIBLE SIGMOIDOSCOPY N/A 12/12/2018   Procedure: FLEXIBLE SIGMOIDOSCOPY;  Surgeon: Ileana Roup, MD;  Location: [facility] ORS;  Service: General;  Laterality: N/A;  . LARYNGOSCOPY  06-27-2004   BX VOCAL CORD  (LEUKOPLAKIA)  PER PT NO ISSUES SINCE  . LEFT HEART CATH AND CORONARY ANGIOGRAPHY N/A 08/20/2017   Procedure: LEFT HEART CATH AND CORONARY ANGIOGRAPHY;  Surgeon: Larey Dresser, MD;  Location: [place] CV LAB;  Service: Cardiovascular;  Laterality: N/A;  . LOOP  RECORDER INSERTION N/A 04/09/2019   Procedure: LOOP RECORDER INSERTION;  Surgeon: Thompson Grayer, MD;  Location: [place] CV LAB;  Service: Cardiovascular;  Laterality: N/A;  . LOOP RECORDER REMOVAL N/A 04/09/2019   Procedure: LOOP RECORDER REMOVAL;  Surgeon: Thompson Grayer, MD;  Location: [place] CV LAB;  Service: Cardiovascular;  Laterality: N/A;  . LOWER EXTREMITY ANGIOGRAM Bilateral 08/29/2015   Procedure: Lower Extremity Angiogram;  Surgeon: Angelia Mould, MD;  Location: [place] CV LAB;  Service: Cardiovascular;  Laterality: Bilateral;  . LUNG LOBECTOMY  01/24/2011    RIGHT UPPER LOBE  (SQUAMOUS CELL CARCINOMA) Dr Dorthea Cove , [facility]. No chemotherapyor radiation  . PATCH ANGIOPLASTY Right 07/14/2013   Procedure: PATCH ANGIOPLASTY;  Surgeon: Angelia Mould, MD;  Location: [place];  Service: Vascular;  Laterality: Right;  . PATCH ANGIOPLASTY Left 08/30/2015   Procedure: VEIN PATCH ANGIOPLASTY OF PROXIMAL Left BYPASS GRAFT;  Surgeon: Angelia Mould, MD;  Location: [place];  Service: Vascular;  Laterality: Left;  . PERIPHERAL VASCULAR CATHETERIZATION N/A 08/29/2015   Procedure: Abdominal Aortogram;  Surgeon: Angelia Mould, MD;  Location: [name] [facility] LAB;  Service: Cardiovascular;  Laterality: N/A;  . POLYPECTOMY  06/25/2018   Procedure: POLYPECTOMY;  Surgeon: Rush Landmark Telford Nab., MD;  Location: [place];  Service: Gastroenterology;;  . POLYPECTOMY    . SUBMUCOSAL INJECTION  06/25/2018   Procedure: SUBMUCOSAL INJECTION;  Surgeon: Rush Landmark Telford Nab., MD;  Location: [place];  Service: Gastroenterology;;  . TEE WITHOUT CARDIOVERSION N/A 02/14/2018   Procedure: TRANSESOPHAGEAL ECHOCARDIOGRAM (TEE);  Surgeon: Larey Dresser, MD;  Location[facility];  Service: Cardiovascular;  Laterality: N/A;  . trabecular surgery  OS  . TRANSTHORACIC ECHOCARDIOGRAM  12-29-2012  DR [facility]   MILD LVH/  LVSF NORMAL/ EF 30-86%/  GRADE I DIASTOLIC DYSFUNCTION     Social History   Socioeconomic History  . Marital status: Married    Spouse name: [name]   . Number of children: 3  . Years of education: 12+  . Highest education level: Not on file  Occupational History  . 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 Smoking status: Former Smoker    Packs/day: 0.50    Years: 50.00    Pack years: 25.00    Types: Cigarettes    Quit date: 07/28/2014    Years since quitting: 6.0  . Smokeless tobacco: Never Used  Vaping Use  . Vaping Use: Never used  Substance and Sexual Activity  . Alcohol use: Yes    Alcohol/week: 4.0 standard drinks    Types: 1 Glasses of wine, 1 Cans of beer, 1 Shots of liquor, 1 Standard drinks or equivalent per week    Comment:  socially, variable  . Drug use: No  . Sexual activity: Not on file  Other Topics Concern  . Not on file  Social History Narrative   Patient lives at home with spouse [name]   Patient has 3 children    Patient is right handed    Social Determinants of Health   Financial Resource Strain:   . Difficulty of Paying Living Expenses: Not on file  Food Insecurity:   . Worried About Charity fundraiser in the Last Year: Not on file  . Ran Out of Food in the Last Year: Not on file  Transportation Needs:   . Lack of Transportation (Medical): Not on file  . Lack of Transportation (Non-Medical): Not on file  Physical Activity:   . Days of Exercise per Week: Not on file  . Minutes of Exercise per Session: Not on file  Stress:   . Feeling of Stress : Not on file  Social Connections:   . Frequency of Communication with Friends and Family: Not on file  . Frequency of Social Gatherings with Friends and Family: Not on file  . Attends Religious Services: Not on file  . Active Member of Clubs or Organizations: Not on file  . Attends Archivist Meetings: Not on file   . Marital Status: Not on file    Family History  Problem Relation Age of Onset  . Stroke Mother        mini strokes  . Alcohol abuse Father   . Heart disease Father        MI after 63  . Stroke Father   . Hypertension Father   . Heart attack Father   . Heart disease Paternal Aunt        several  . Hypertension Paternal Aunt        several  . Stroke Paternal Aunt        several  . Stroke Paternal Uncle        several  . Heart disease Paternal Uncle        several;2 had MI pre 14  . Cancer Daughter 41       breast ca, also with benign sessile polyp   . Colon cancer Daughter 42  . Colon polyps Neg Hx   . Esophageal cancer Neg Hx   .  Rectal cancer Neg Hx   . Stomach cancer Neg Hx     Review of Systems  Constitutional: Negative for appetite change and fever.  Respiratory: Positive for shortness of breath (a little). Negative for cough and wheezing.   Cardiovascular: Negative for chest pain, palpitations and leg swelling.  Gastrointestinal:       No gerd  Neurological: Negative for light-headedness and headaches.  Psychiatric/Behavioral: Negative for dysphoric mood and sleep disturbance. The patient is not nervous/anxious.        Objective:   Vitals:   08/02/20 1118  BP: 130/70  Pulse: (!) 59  Temp: 98.3 F (36.8 C)  SpO2: 90%   BP Readings from Last 3 Encounters:  08/02/20 130/70  06/23/20 (!) 148/66  06/02/20 138/86   Wt Readings from Last 3 Encounters:  08/02/20 189 lb (85.7 kg)  06/23/20 198 lb (89.8 kg)  06/09/20 198 lb (89.8 kg)   Body mass index is 24.6 kg/m.   Physical Exam    Constitutional: Appears well-developed and well-nourished. No distress.  HENT:  Head: Normocephalic and atraumatic.  Neck: Neck supple. No tracheal deviation present. No thyromegaly present.  No cervical lymphadenopathy Cardiovascular: Normal rate, regular rhythm and normal heart sounds.   No murmur heard. No carotid bruit .  No edema Pulmonary/Chest: Effort normal  and breath sounds normal. No respiratory distress. No has no wheezes. No rales.  Skin: Skin is warm and dry. Not diaphoretic.  Psychiatric: Normal mood and affect. Behavior is normal.      Assessment & Plan:    See Problem List for Assessment and Plan of chronic medical problems.    This visit occurred during the SARS-CoV-2 public health emergency.  Safety protocols were in place, including screening questions prior to the visit, additional usage of staff PPE, and extensive cleaning of exam room while observing appropriate contact time as indicated for disinfecting solutions.

## 2020-08-02 ENCOUNTER — Other Ambulatory Visit: Payer: Self-pay

## 2020-08-02 ENCOUNTER — Encounter: Payer: Self-pay | Admitting: Internal Medicine

## 2020-08-02 ENCOUNTER — Ambulatory Visit (INDEPENDENT_AMBULATORY_CARE_PROVIDER_SITE_OTHER): Payer: PPO | Admitting: Internal Medicine

## 2020-08-02 VITALS — BP 130/70 | HR 59 | Temp 98.3°F | Ht 73.5 in | Wt 189.0 lb

## 2020-08-02 DIAGNOSIS — I10 Essential (primary) hypertension: Secondary | ICD-10-CM | POA: Diagnosis not present

## 2020-08-02 DIAGNOSIS — F3289 Other specified depressive episodes: Secondary | ICD-10-CM

## 2020-08-02 DIAGNOSIS — I700 Atherosclerosis of aorta: Secondary | ICD-10-CM | POA: Diagnosis not present

## 2020-08-02 NOTE — Assessment & Plan Note (Signed)
Chronic Currently on Crestor 40 mg daily-continue He is exercising regularly-works with a trainer 3 times a week and also walks Overall diet is healthy  LDL is at goal of less than 70 Continue above

## 2020-08-02 NOTE — Assessment & Plan Note (Signed)
Chronic BP well controlled Continue amlodipine 5 mg daily losartan 50 mg daily, metoprolol 12.5 mg daily and Lasix 40 mg daily

## 2020-08-02 NOTE — Assessment & Plan Note (Signed)
Chronic Controlled, stable Continue Lexapro 10 mg daily

## 2020-08-02 NOTE — Patient Instructions (Signed)
  Medications reviewed and updated.  Changes include :   none    Please followup in 6 months   

## 2020-08-04 NOTE — Progress Notes (Signed)
Carelink Summary Report / Loop Recorder 

## 2020-08-05 ENCOUNTER — Telehealth: Payer: Self-pay | Admitting: Oncology

## 2020-08-05 ENCOUNTER — Other Ambulatory Visit: Payer: Self-pay

## 2020-08-05 ENCOUNTER — Inpatient Hospital Stay: Payer: PPO | Attending: Oncology

## 2020-08-05 ENCOUNTER — Inpatient Hospital Stay: Payer: PPO | Admitting: Oncology

## 2020-08-05 VITALS — BP 135/57 | HR 56 | Temp 98.8°F | Resp 18 | Ht 73.5 in | Wt 194.3 lb

## 2020-08-05 DIAGNOSIS — I4891 Unspecified atrial fibrillation: Secondary | ICD-10-CM | POA: Diagnosis not present

## 2020-08-05 DIAGNOSIS — C187 Malignant neoplasm of sigmoid colon: Secondary | ICD-10-CM

## 2020-08-05 DIAGNOSIS — J449 Chronic obstructive pulmonary disease, unspecified: Secondary | ICD-10-CM | POA: Insufficient documentation

## 2020-08-05 DIAGNOSIS — K31A0 Gastric intestinal metaplasia, unspecified: Secondary | ICD-10-CM | POA: Insufficient documentation

## 2020-08-05 DIAGNOSIS — C3431 Malignant neoplasm of lower lobe, right bronchus or lung: Secondary | ICD-10-CM | POA: Insufficient documentation

## 2020-08-05 LAB — CBC WITH DIFFERENTIAL (CANCER CENTER ONLY)
Abs Immature Granulocytes: 0.02 10*3/uL (ref 0.00–0.07)
Basophils Absolute: 0 10*3/uL (ref 0.0–0.1)
Basophils Relative: 1 %
Eosinophils Absolute: 0.1 10*3/uL (ref 0.0–0.5)
Eosinophils Relative: 3 %
HCT: 41.8 % (ref 39.0–52.0)
Hemoglobin: 13.5 g/dL (ref 13.0–17.0)
Immature Granulocytes: 0 %
Lymphocytes Relative: 18 %
Lymphs Abs: 0.8 10*3/uL (ref 0.7–4.0)
MCH: 29.9 pg (ref 26.0–34.0)
MCHC: 32.3 g/dL (ref 30.0–36.0)
MCV: 92.7 fL (ref 80.0–100.0)
Monocytes Absolute: 0.3 10*3/uL (ref 0.1–1.0)
Monocytes Relative: 6 %
Neutro Abs: 3.4 10*3/uL (ref 1.7–7.7)
Neutrophils Relative %: 72 %
Platelet Count: 234 10*3/uL (ref 150–400)
RBC: 4.51 MIL/uL (ref 4.22–5.81)
RDW: 15.4 % (ref 11.5–15.5)
WBC Count: 4.7 10*3/uL (ref 4.0–10.5)
nRBC: 0 % (ref 0.0–0.2)

## 2020-08-05 LAB — CEA (IN HOUSE-CHCC): CEA (CHCC-In House): <1 ng/mL (ref 0.00–5.00)

## 2020-08-05 NOTE — Telephone Encounter (Signed)
Scheduled appointment per 11/5 los. Spoke to patient who is aware of appointments dates and times.

## 2020-08-05 NOTE — Progress Notes (Signed)
[place] OFFICE PROGRESS NOTE   Diagnosis: Colon cancer, lung cancer, iron deficiency anemia  INTERVAL HISTORY:   Troy [name] returns as scheduled.  He reports feeling well.  Good appetite.  He had a diarrhea illness approximately 1 month ago.  The diarrhea has resolved.  He underwent a colonoscopy in September.  Multiple polyps were removed and the pathology revealed tubular adenomas.   Objective:  Vital signs in last 24 hours:  Blood pressure (!) 135/57, pulse (!) 56, temperature 98.8 F (37.1 C), temperature source Tympanic, resp. rate 18, height 6' 1.5" (1.867 m), weight 194 lb 4.8 oz (88.1 kg), SpO2 94 %.   Lymphatics: No cervical, supraclavicular, axillary, or inguinal nodes Resp: Lungs clear bilaterally Cardio: Regular rate and rhythm GI: No mass, nontender, no hepatosplenomegaly Vascular: No leg edema, left lower leg is larger than the right side  Lab Results:  Lab Results  Component Value Date   WBC 4.7 08/05/2020   HGB 13.5 08/05/2020   HCT 41.8 08/05/2020   MCV 92.7 08/05/2020   PLT 234 08/05/2020   NEUTROABS 3.4 08/05/2020    CMP  Lab Results  Component Value Date   NA 142 05/16/2020   K 4.3 05/16/2020   CL 105 05/16/2020   CO2 25 05/16/2020   GLUCOSE 134 (H) 05/16/2020   BUN 17 05/16/2020   CREATININE 1.09 05/16/2020   CALCIUM 9.0 05/16/2020   PROT 7.3 12/09/2019   ALBUMIN 3.6 12/09/2019   AST 23 12/09/2019   ALT 22 12/09/2019   ALKPHOS 82 12/09/2019   BILITOT 0.8 12/09/2019   GFRNONAA 64 05/16/2020   GFRAA 74 05/16/2020    Lab Results  Component Value Date   CEA1 <1.00 01/14/2020     Medications: I have reviewed the patient's current medications.   Assessment/Plan:  1. Adenocarcinoma of sigmoid colon-stage II (T3N0)  Colonoscopy 06/25/2018- multiple polyps in the ascending and descending colon, partially obstructing mass in the sigmoid colon-biopsy confirmed adenocarcinoma  CTs 06/25/2018-enlarging right paratracheal  lymph node compared to November 2018, no other evidence of metastatic disease  Low anterior resection 12/12/2018, stage II (T3N0) adenocarcinoma the sigmoid colon, no loss of mismatch repair protein expression, MSI-stable  Colonoscopy 06/23/2020-polyps removed from the transverse colon, ascending colon, and cecum-tubular adenomas   CT chest 01/14/2020-stable mildly enlarged high right paratracheal node and right hilar node  CT abdomen/pelvis 01/29/2020-no evidence of recurrent disease, stable right lower lobe nodule   2.  History of iron deficiency anemia Secondary to bleeding from tumor, exacerbated by anticoagulation with eliquis 3.Stage Ib squamous cell carcinoma of right lower lung s/pbronchoscopy, mediastinoscopy, right thoracoscopic lowerlobectomy 01/24/2011 -Path poorly differentiated squamous cell carcinoma with positive vascular invasion LN stations 4R, 4L, 7, 8, 9, 10, 11, and 12 w/o evidence of malignancy - pT2a,pN0,pMX stage IB  -managed and monitored per Dr. Elenor Quinones at [facility]. Last Ct chest 05/01/18 stable, without concerning evidence of recurrence -CT 06/25/2018- nonspecific small bilateral nodules, enlarging superior right paratracheal node -EUS biopsy of the right paratracheal nodes (several rounded nodes measuring 4 cm in total) on 07/10/2018- negative for malignancy -CT chest 03/03/2019- stable postoperative appearance of the chest, unchanged mildly prominent right paratracheal lymph node, no evidence of metastatic disease -CT chest 01/14/2020-stable surgical changes in the right chest, stable mild mediastinal and right hilar adenopathy  4. COPD 5. Atrial fibrillation 6. Thyrotoxicosis-potentially amiodarone related 7.  Multiple gastric polyps on endoscopy 06/23/2020-1 oozing polyp-hyperplastic polyp, biopsy from the gastric antrum revealed intestinal metaplasia  Disposition: Troy [name] is in clinical remission from colon cancer and lung cancer.  The hemoglobin is in  the normal range.  He continues iron therapy.  He is followed by Dr. Rush Landmark for management of gastric metaplasia and GI bleeding.  He will return for an office visit and restaging chest CT in 6 months.  Betsy Coder, MD  08/05/2020  11:25 AM

## 2020-08-08 ENCOUNTER — Telehealth: Payer: Self-pay

## 2020-08-08 ENCOUNTER — Ambulatory Visit (INDEPENDENT_AMBULATORY_CARE_PROVIDER_SITE_OTHER): Payer: PPO

## 2020-08-08 DIAGNOSIS — Z0000 Encounter for general adult medical examination without abnormal findings: Secondary | ICD-10-CM | POA: Diagnosis not present

## 2020-08-08 NOTE — Telephone Encounter (Signed)
-----   Message from Ladell Pier, MD sent at 08/05/2020  4:02 PM EDT ----- Please call patient, CEA is normal

## 2020-08-08 NOTE — Progress Notes (Signed)
I connected with Troy Holmes [name] today by telephone and verified that I am speaking with the correct person using two identifiers. Location patient: home Location provider: work Persons participating in the virtual visit: Troy Kallenbach Sr and [place], LPN.   I discussed the limitations, risks, security and privacy concerns of performing an evaluation and management service by telephone and the availability of in person appointments. I also discussed with the patient that there may be a patient responsible charge related to this service. The patient expressed understanding and verbally consented to this telephonic visit.    Interactive audio and video telecommunications were attempted between this provider and patient, however failed, due to patient having technical difficulties OR patient did not have access to video capability.  We continued and completed visit with audio only.  Some vital signs may be absent or patient reported.   Time Spent with patient on telephone encounter: 30 minutes  Subjective:   Troy Levario [name] is a 79 y.o. male who presents for Medicare Annual/Subsequent preventive examination.  Review of Systems    No ROS. Medicare Wellness Visit. Cardiac Risk Factors include: advanced age (>59men, >77 women);dyslipidemia;family history of premature cardiovascular disease;hypertension;male gender Sleep Patterns: No sleep issues, feels rested on waking and sleeps 6-8 hours nightly. Home Safety/Smoke Alarms: Feels safe in home; uses home alarm. Smoke alarms in place. Living environment: 2-story home; Lives alone on the main level of home; no needs for DME; good support system. Seat Belt Safety/Bike Helmet: Wears seat belt.    Objective:    There were no vitals filed for this visit. There is no height or weight on file to calculate BMI.  Advanced Directives 08/08/2020 02/12/2020 04/09/2019 12/12/2018 12/03/2018 07/10/2018 07/08/2018  Does Patient Have a Medical  Advance Directive? Yes Yes Yes Yes Yes Yes Yes  Type of Paramedic of [place];Living will [place];Living will Healthcare Power of [place];Living will [place];Living will [place];Living will [place];Living will  Does patient want to make changes to medical advance directive? No - Patient declined No - Patient declined - No - Patient declined No - Patient declined - No - Patient declined  Copy of [place] in Chart? No - copy requested - - No - copy requested No - copy requested No - copy requested -  Would patient like information on creating a medical advance directive? - - - - - - -  Pre-existing out of facility DNR order (yellow form or pink MOST form) - - - - - - -    Current Medications (verified) Outpatient Encounter Medications as of 08/08/2020  Medication Sig  . amLODipine (NORVASC) 5 MG tablet Take 1 tablet (5 mg total) by mouth daily.  [name] apixaban (ELIQUIS) 5 MG TABS tablet Take 1 tablet (5 mg total) by mouth 2 (two) times daily.  . Ascorbic Acid (VITAMIN C PO) Take 500 mg by mouth in the morning and at bedtime.   . Biotin 2500 MCG CAPS Take 2,500 mcg by mouth daily.  . Brinzolamide-Brimonidine (SIMBRINZA) 1-0.2 % SUSP Place 1 drop into both eyes 2 times daily.  . budesonide-formoterol (SYMBICORT) 160-4.5 MCG/ACT inhaler Inhale 2 puffs into the lungs 2 (two) times daily.  [name] docusate sodium (COLACE) 100 MG capsule Take 100 mg by mouth 2 (two) times daily.   [name] escitalopram (LEXAPRO) 10 MG tablet Take 1 tablet (10 mg total) by mouth daily.  [name]  ferrous sulfate 325 (65 FE) MG tablet Take 325 mg by mouth 2 (two) times daily with a meal.   . furosemide (LASIX) 40 MG tablet Take 1 tablet (40 mg total) by mouth 2 (two) times daily.  . furosemide (LASIX) 40 MG tablet Take 1 tablet (40 mg total) by mouth daily.  [name] latanoprost (XALATAN) 0.005 %  ophthalmic solution   . losartan (COZAAR) 25 MG tablet TAKE (2) TABLETS BY MOUTH DAILY.  . Magnesium 500 MG CAPS Take 500 mg by mouth daily.   . Melatonin 5 MG TABS Take 5 mg by mouth at bedtime.  . metoprolol succinate (TOPROL-XL) 25 MG 24 hr tablet Take 0.5 tablets (12.5 mg total) by mouth at bedtime.  . montelukast (SINGULAIR) 10 MG tablet TAKE ONE TABLET AT BEDTIME.  . Multiple Vitamins-Minerals (CENTRUM SILVER PO) Take 1 tablet by mouth daily.  . Multiple Vitamins-Minerals (ZINC PO) Take 1 tablet by mouth daily.  [name] omeprazole (PRILOSEC) 20 MG capsule Take 1 capsule (20 mg total) by mouth 2 (two) times daily as needed. Take 30- 60 min before your first and last meals of the day  . omeprazole (PRILOSEC) 40 MG capsule Take 1 capsule (40 mg total) by mouth in the morning and at bedtime. 40 mg twice daily for 30 days and then drop down to 20 mg twice daily  . PEG-KCl-NaCl-NaSulf-Na Asc-C (PLENVU) 140 g SOLR Take 1 kit by mouth as directed. Pt has a manufacturers coupon to bring to  Pharmacy  . Polyethyl Glycol-Propyl Glycol (SYSTANE) 0.4-0.3 % SOLN Place 1 drop into both eyes daily as needed (for dry eyes).   . potassium chloride SA (KLOR-CON) 20 MEQ tablet Take 1 tablet (20 mEq total) by mouth daily.  . rosuvastatin (CRESTOR) 40 MG tablet Take 1 tablet (40 mg total) by mouth daily.  [name] senna (SENOKOT) 8.6 MG tablet Take 1 tablet by mouth 2 (two) times daily.   No facility-administered encounter medications on file as of 08/08/2020.    Allergies (verified) Niacin-lovastatin er, Penicillins, Sulfonamide derivatives, and Atorvastatin   History: Past Medical History:  Diagnosis Date  . AAA (abdominal aortic aneurysm) ([place]) LAST ABDOMINAL US 10-20-17 3.3 CM   MONITORED BY DR [name]  . Allergy   . Anxiety   . Arthritis   . Atrial fibrillation ([place])   . Basal cell carcinoma   . Bilateral carotid artery stenosis DUPLEX 12-29-2012  BY DR [facility]   BILATERAL ICA STENOSIS 60-79%  . BPH (benign  prostatic hypertrophy)   . Cataract    removed both eyes  . Chronic diastolic heart failure ([place])   . Chronic kidney disease    stage 3, pt unaware  . Colon cancer ([place])   . Complication of anesthesia    had nausea after carotid artery surgery, states the medicine given to help the nausea, made it worse  . Constipation   . COPD (chronic obstructive pulmonary disease) ([place])   . Depression   . Emphysema of lung ([place])   . GERD (gastroesophageal reflux disease)   . Glaucoma BOTH EYES   Dr Gershon Crane  . History of basal cell carcinoma excision   . History of lung cancer APRIL 2012  SQUAMOUS CELL---- S/P RIGHT LOWER LOBECTOMY AT [facility] --  NO CHEMORADIATION---  NO RECURRENCE    ONCOLOGIST- DR Tressie Stalker  LOV IN [facility] 10-27-2012  . History of pneumothorax    pt unaware  . Hx of adenomatous colonic polyps 2005    X 2; 1 hyperplastic polyp;  Dr Juanda Chance  . Hyperlipidemia   . Hypertension   . Impaired fasting glucose 2007   108; A1c5.4%  . Lesion of bladder   . Lung cancer (HCC) 01/25/2012  . Microhematuria   . OSA (obstructive sleep apnea) 08/29/2015   CPAP SET ON 10  . PAD (peripheral artery disease) (HCC) ABI'S  JAN 2014  0.65 ON RIGHT ;  1.04 ON LEFT  . Peripheral vascular disease (HCC) S/P ANGIOPLASTY AND STENTING   FOLLOWED  BY DR Edilia Bo  . Pleural effusion on right 11/18/2018   Chronic, noted on CXR  . Sleep apnea    + cpap   . Spinal stenosis Sept. 2015  . Status post placement of implantable loop recorder   . Stroke [facility]) Jul 07, 2013   mini  TIA  . Thoracic aorta atherosclerosis (HCC)   . Thyrotoxicosis    amiodarone induced  . Urgency of urination    Past Surgical History:  Procedure Laterality Date  . ANGIO PLASTY     X 4 in legs  . AORTOGRAM  07-27-2002   MILD DIFFUSE ILIAC ARTERY OCCLUSIVE DISEASE /  LEFT RENAL ARTERY 20%/ PATENT LEFT FEM-POP GRAFT/ MILD SFA AND POPLITEAL ARTERY OCCLUSIVE DISEASE W/ SEVERE KIDNEY OCCLUSIVE DISEASE  . ATRIAL FIBRILLATION ABLATION N/A  04/09/2019   Procedure: ATRIAL FIBRILLATION ABLATION;  Surgeon: Hillis Range, MD;  [facility];  Service: Cardiovascular;  Laterality: N/A;  . ATRIAL FIBRILLATION ABLATION N/A 02/12/2020   Procedure: ATRIAL FIBRILLATION ABLATION;  Surgeon: Hillis Range, MD;  [facility];  Service: Cardiovascular;  Laterality: N/A;  . BASAL CELL CARCINOMA EXCISION     MULTIPLE TIMES--  RIGHT FOREARM, CHEEKS, AND BACK  . BIOPSY  06/25/2018   Procedure: BIOPSY;  Surgeon: Meridee Score Netty Starring., MD;  Location: [facility] ENDOSCOPY;  Service: Gastroenterology;;  . CARDIOVASCULAR STRESS TEST  12-08-2012  DR MCLEAN   NORMAL LEXISCAN WITH NO EXERCISE NUCLEAR STUDY/ EF 66%/   NO ISCHEMIA/ NO SIGNIFICANT CHANGE FROM PRIOR STUDY  . CARDIOVERSION N/A 02/14/2018   Procedure: CARDIOVERSION;  Surgeon: Laurey Morale, MD;  Location: [facility] ENDOSCOPY;  Service: Cardiovascular;  Laterality: N/A;  . CAROTID ANGIOGRAM N/A 07/10/2013   Procedure: CAROTID ANGIOGRAM;  Surgeon: Sherren Kerns, MD;  Location: [facility] CATH LAB;  Service: Cardiovascular;  Laterality: N/A;  . CAROTID ENDARTERECTOMY Right 07-14-13   cea  . CATARACT EXTRACTION W/ INTRAOCULAR LENS  IMPLANT, BILATERAL    . colon polyectomy    . COLONOSCOPY    . COLONOSCOPY WITH PROPOFOL N/A 06/25/2018   Procedure: COLONOSCOPY WITH PROPOFOL;  Surgeon: Meridee Score Netty Starring., MD;  Location: [facility] ENDOSCOPY;  Service: Gastroenterology;  Laterality: N/A;  . CYSTOSCOPY W/ RETROGRADES Bilateral 01/21/2013   Procedure: CYSTOSCOPY WITH RETROGRADE PYELOGRAM;  Surgeon: Milford Cage, MD;  Location: [facility];  Service: Urology;  Laterality: Bilateral;   CYSTO, BLADDER BIOPSY, BILATERAL RETROGRADE PYELOGRAM  RAD TECH FROM RADIOLOGY PER JOY  . CYSTOSCOPY WITH BIOPSY N/A 01/21/2013   Procedure: CYSTOSCOPY WITH BIOPSY;  Surgeon: Milford Cage, MD;  [facility];  Service: Urology;  Laterality: N/A;  .  ENDARTERECTOMY Right 07/14/2013   Procedure: ENDARTERECTOMY CAROTID;  Surgeon: Chuck Hint, MD;  Location: [facility] OR;  Service: Vascular;  Laterality: Right;  . EP IMPLANTABLE DEVICE N/A 08/12/2015   Procedure: Loop Recorder Insertion;  Surgeon: Hillis Range, MD;  [facility];  Service: Cardiovascular;  Laterality: N/A;  . ESOPHAGOGASTRODUODENOSCOPY (EGD) WITH  PROPOFOL N/A 06/25/2018   Procedure: ESOPHAGOGASTRODUODENOSCOPY (EGD) WITH PROPOFOL;  Surgeon: Rush Landmark Telford Nab., MD;  Location: [place];  Service: Gastroenterology;  Laterality: N/A;  . ESOPHAGOGASTRODUODENOSCOPY (EGD) WITH PROPOFOL N/A 07/10/2018   Procedure: ESOPHAGOGASTRODUODENOSCOPY (EGD) WITH PROPOFOL;  Surgeon: Milus Banister, MD;  Location: [facility] ENDOSCOPY;  Service: Endoscopy;  Laterality: N/A;  . EUS N/A 07/10/2018   Procedure: UPPER ENDOSCOPIC ULTRASOUND (EUS) RADIAL;  Surgeon: Milus Banister, MD;  Location: [facility] ENDOSCOPY;  Service: Endoscopy;  Laterality: N/A;  . EYE SURGERY Right   . FEMORAL-POPLITEAL BYPASS GRAFT Left 1994  [facility]   AND 2001  IN [name]  . FEMORAL-POPLITEAL BYPASS GRAFT Left 08/30/2015   Procedure: REVISION OF BYPASS GRAFT Left  FEMORAL-POPLITEAL ARTERY;  Surgeon: Angelia Mould, MD;  Location: [place];  Service: Vascular;  Laterality: Left;  . FINE NEEDLE ASPIRATION N/A 07/10/2018   Procedure: FINE NEEDLE ASPIRATION (FNA) LINEAR;  Surgeon: Milus Banister, MD;  Location: [facility] ENDOSCOPY;  Service: Endoscopy;  Laterality: N/A;  . FLEXIBLE SIGMOIDOSCOPY N/A 12/12/2018   Procedure: FLEXIBLE SIGMOIDOSCOPY;  Surgeon: Ileana Roup, MD;  Location: [facility] ORS;  Service: General;  Laterality: N/A;  . LARYNGOSCOPY  06-27-2004   BX VOCAL CORD  (LEUKOPLAKIA)  PER PT NO ISSUES SINCE  . LEFT HEART CATH AND CORONARY ANGIOGRAPHY N/A 08/20/2017   Procedure: LEFT HEART CATH AND CORONARY ANGIOGRAPHY;  Surgeon: Larey Dresser, MD;  Location: [place] CV LAB;  Service:  Cardiovascular;  Laterality: N/A;  . LOOP RECORDER INSERTION N/A 04/09/2019   Procedure: LOOP RECORDER INSERTION;  Surgeon: Thompson Grayer, MD;  Location: [place] CV LAB;  Service: Cardiovascular;  Laterality: N/A;  . LOOP RECORDER REMOVAL N/A 04/09/2019   Procedure: LOOP RECORDER REMOVAL;  Surgeon: Thompson Grayer, MD;  Location: [place] CV LAB;  Service: Cardiovascular;  Laterality: N/A;  . LOWER EXTREMITY ANGIOGRAM Bilateral 08/29/2015   Procedure: Lower Extremity Angiogram;  Surgeon: Angelia Mould, MD;  Location: [place] CV LAB;  Service: Cardiovascular;  Laterality: Bilateral;  . LUNG LOBECTOMY  01/24/2011    RIGHT UPPER LOBE  (SQUAMOUS CELL CARCINOMA) Dr Dorthea Cove , [facility]. No chemotherapyor radiation  . PATCH ANGIOPLASTY Right 07/14/2013   Procedure: PATCH ANGIOPLASTY;  Surgeon: Angelia Mould, MD;  Location: [place];  Service: Vascular;  Laterality: Right;  . PATCH ANGIOPLASTY Left 08/30/2015   Procedure: VEIN PATCH ANGIOPLASTY OF PROXIMAL Left BYPASS GRAFT;  Surgeon: Angelia Mould, MD;  Location: [place];  Service: Vascular;  Laterality: Left;  . PERIPHERAL VASCULAR CATHETERIZATION N/A 08/29/2015   Procedure: Abdominal Aortogram;  Surgeon: Angelia Mould, MD;  Location: [place] CV LAB;  Service: Cardiovascular;  Laterality: N/A;  . POLYPECTOMY  06/25/2018   Procedure: POLYPECTOMY;  Surgeon: Rush Landmark Telford Nab., MD;  Location: [place];  Service: Gastroenterology;;  . POLYPECTOMY    . SUBMUCOSAL INJECTION  06/25/2018   Procedure: SUBMUCOSAL INJECTION;  Surgeon: Rush Landmark Telford Nab., MD;  Location: [place];  Service: Gastroenterology;;  . TEE WITHOUT CARDIOVERSION N/A 02/14/2018   Procedure: TRANSESOPHAGEAL ECHOCARDIOGRAM (TEE);  Surgeon: Larey Dresser, MD;  Location: [facility] ENDOSCOPY;  Service: Cardiovascular;  Laterality: N/A;  . trabecular surgery     OS  . TRANSTHORACIC ECHOCARDIOGRAM  12-29-2012  DR [facility]   MILD LVH/  LVSF NORMAL/ EF  27-74%/  GRADE I DIASTOLIC DYSFUNCTION   Family History  Problem Relation Age of Onset  . Stroke Mother        mini strokes  . Alcohol  abuse Father   . Heart disease Father        MI after 78  . Stroke Father   . Hypertension Father   . Heart attack Father   . Heart disease Paternal Aunt        several  . Hypertension Paternal Aunt        several  . Stroke Paternal Aunt        several  . Stroke Paternal Uncle        several  . Heart disease Paternal Uncle        several;2 had MI pre 50  . Cancer Daughter 10       breast ca, also with benign sessile polyp   . Colon cancer Daughter 51  . Colon polyps Neg Hx   . Esophageal cancer Neg Hx   . Rectal cancer Neg Hx   . Stomach cancer Neg Hx    Social History   Socioeconomic History  . Marital status: Married    Spouse name: [name]   . Number of children: 3  . Years of education: 12+  . Highest education level: Not on file  Occupational History  . 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 Smoking status: Former Smoker    Packs/day: 0.50    Years: 50.00    Pack years: 25.00    Types: Cigarettes    Quit date: 07/28/2014    Years since quitting: 6.0  . Smokeless tobacco: Never Used  Vaping Use  . Vaping Use: Never used  Substance and Sexual Activity  . Alcohol use: Yes    Alcohol/week: 4.0 standard drinks    Types: 1 Glasses of wine, 1 Cans of beer, 1 Shots of liquor, 1 Standard drinks or equivalent per week    Comment:  socially, variable  . Drug use: No  . Sexual activity: Not on file  Other Topics Concern  . Not on file  Social History Narrative   Patient lives at home with spouse [name]   Patient has 3 children    Patient is right handed    Social Determinants of Health   Financial Resource Strain: Low Risk   . Difficulty of Paying Living Expenses: Not hard at all  Food  Insecurity: No Food Insecurity  . Worried About Charity fundraiser in the Last Year: Never true  . Ran Out of Food in the Last Year: Never true  Transportation Needs: No Transportation Needs  . Lack of Transportation (Medical): No  . Lack of Transportation (Non-Medical): No  Physical Activity: Insufficiently Active  . Days of Exercise per Week: 3 days  . Minutes of Exercise per Session: 30 min  Stress: No Stress Concern Present  . Feeling of Stress : Not at all  Social Connections:   . Frequency of Communication with Friends and Family: Not on file  . Frequency of Social Gatherings with Friends and Family: Not on file  . Attends Religious Services: Not on file  . Active Member of Clubs or Organizations: Not on file  . Attends Archivist Meetings: Not on file  . Marital Status: Not on file    Tobacco Counseling Counseling given: Not Answered   Clinical Intake:  Pre-visit preparation completed: Yes  Pain : No/denies pain     Nutritional Risks: None Diabetes: No  What is the last  grade level you completed in school?: HSG  Diabetic? no  Interpreter Needed?: No  Information entered by :: Evette Cristal, LPN   Activities of Daily Living In your present state of health, do you have any difficulty performing the following activities: 08/08/2020 08/02/2020  Hearing? N N  Vision? Y N  Difficulty concentrating or making decisions? N N  Walking or climbing stairs? N N  Dressing or bathing? N N  Doing errands, shopping? N N  Preparing Food and eating ? N -  Using the Toilet? N -  In the past six months, have you accidently leaked urine? N -  Do you have problems with loss of bowel control? N -  Managing your Medications? N -  Managing your Finances? N -  Housekeeping or managing your Housekeeping? N -  Some recent data might be hidden    Patient Care Team: Binnie Rail, MD as PCP - General (Internal Medicine) Thompson Grayer, MD as PCP - Electrophysiology  (Cardiology) Everardo All, MD (Hematology and Oncology) Lerry Paterson, MD as Referring Physician (Thoracic Surgery) Larey Dresser, MD as Consulting Physician (Cardiology) Susa Day, MD as Consulting Physician (Orthopedic Surgery)  Indicate any recent Medical Services you may have received from other than [facility] providers in the past year (date may be approximate).     Assessment:   This is a routine wellness examination for Z[place].  Hearing/Vision screen No exam data present  Dietary issues and exercise activities discussed: Current Exercise Habits: Home exercise routine, Type of exercise: walking, Time (Minutes): 30, Frequency (Times/Week): 3, Weekly Exercise (Minutes/Week): 90, Intensity: Mild, Exercise limited by: cardiac condition(s);respiratory conditions(s);psychological condition(s)  Goals   None    Depression Screen PHQ 2/9 Scores 08/02/2020 06/20/2018 03/21/2018 11/18/2017 11/04/2017 01/12/2015  PHQ - 2 Score 0 0 0 0 0 0  PHQ- 9 Score - 0 1 - - -    Fall Risk Fall Risk  08/08/2020 08/02/2020 03/21/2018 11/18/2017 11/04/2017  Falls in the past year? 0 0 Yes Yes No  Number falls in past yr: 0 0 2 or more 1 -  Injury with Fall? 0 0 Yes No -  Risk for fall due to : Impaired vision;No Fall Risks No Fall Risks Impaired balance/gait;History of fall(s);Impaired vision Impaired vision -  Risk for fall due to: Comment - - - has glaucoma, and macular degeneration -  Follow up Falls evaluation completed Falls evaluation completed Falls prevention discussed;Education provided Falls prevention discussed -    Any stairs in or around the home? Yes  If so, are there any without handrails? No  Home free of loose throw rugs in walkways, pet beds, electrical cords, etc? Yes  Adequate lighting in your home to reduce risk of falls? Yes   ASSISTIVE DEVICES UTILIZED TO PREVENT FALLS:  Life alert? No  Use of a cane, walker or w/c? No  Grab bars in the bathroom? Yes  Shower chair or bench  in shower? Yes  Elevated toilet seat or a handicapped toilet? Yes   TIMED UP AND GO:  Was the test performed? No .  Length of time to ambulate 10 feet: 0 sec.   Gait slow and steady without use of assistive device  Cognitive Function:   Montreal Cognitive Assessment  08/18/2014  Visuospatial/ Executive (0/5) 2  Naming (0/3) 3  Attention: Read list of digits (0/2) 2  Attention: Read list of letters (0/1) 1  Attention: Serial 7 subtraction starting at 100 (0/3) 3  Language: Repeat phrase (0/2)  2  Language : Fluency (0/1) 0  Abstraction (0/2) 2  Delayed Recall (0/5) 3  Orientation (0/6) 6  Total 24  Adjusted Score (based on education) 24      Immunizations Immunization History  Administered Date(s) Administered  . Influenza Split 07/05/2017  . Influenza Whole 07/28/2010  . Influenza,inj,Quad PF,6+ Mos 07/07/2013, 07/30/2014  . Influenza-Unspecified 07/02/2015  . PFIZER SARS-COV-2 Vaccination 10/18/2019, 11/08/2019, 06/14/2020  . Pneumococcal Conjugate-13 11/28/2015  . Pneumococcal Polysaccharide-23 10/02/2007  . Td 03/14/2010    TDAP status: Due, Education has been provided regarding the importance of this vaccine. Advised may receive this vaccine at local pharmacy or Health Dept. Aware to provide a copy of the vaccination record if obtained from local pharmacy or Health Dept. Verbalized acceptance and understanding. Flu Vaccine status: Declined, Education has been provided regarding the importance of this vaccine but patient still declined. Advised may receive this vaccine at local pharmacy or Health Dept. Aware to provide a copy of the vaccination record if obtained from local pharmacy or Health Dept. Verbalized acceptance and understanding. Pneumococcal vaccine status: Up to date Covid-19 vaccine status: Completed vaccines  Qualifies for Shingles Vaccine? Yes   Zostavax completed No   Shingrix Completed?: No.    Education has been provided regarding the importance of  this vaccine. Patient has been advised to call insurance company to determine out of pocket expense if they have not yet received this vaccine. Advised may also receive vaccine at local pharmacy or Health Dept. Verbalized acceptance and understanding.  Screening Tests Health Maintenance  Topic Date Due  . Hepatitis C Screening  Never done  . TETANUS/TDAP  03/14/2020  . INFLUENZA VACCINE  05/01/2020  . COLONOSCOPY  06/24/2023  . COVID-19 Vaccine  Completed  . PNA vac Low Risk Adult  Completed    Health Maintenance  Health Maintenance Due  Topic Date Due  . Hepatitis C Screening  Never done  . TETANUS/TDAP  03/14/2020  . INFLUENZA VACCINE  05/01/2020    Colorectal cancer screening: Completed 06/23/2020. Repeat every 3 years  Lung Cancer Screening: (Low Dose CT Chest recommended if Age 72-80 years, 30 pack-year currently smoking OR have quit w/in 15years.) does not qualify.   Lung Cancer Screening Referral: no  Additional Screening:  Hepatitis C Screening: does qualify; Completed no  Vision Screening: Recommended annual ophthalmology exams for early detection of glaucoma and other disorders of the eye. Is the patient up to date with their annual eye exam?  Yes  Who is the provider or what is the name of the office in which the patient attends annual eye exams? Raynelle Fanning, MD If pt is not established with a provider, would they like to be referred to a provider to establish care? No .   Dental Screening: Recommended annual dental exams for proper oral hygiene  Community Resource Referral / Chronic Care Management: CRR required this visit?  No   CCM required this visit?  No      Plan:     I have personally reviewed and noted the following in the patient's chart:   . Medical and social history . Use of alcohol, tobacco or illicit drugs  . Current medications and supplements . Functional ability and status . Nutritional status . Physical activity . Advanced  directives . List of other physicians . Hospitalizations, surgeries, and ER visits in previous 12 months . Vitals . Screenings to include cognitive, depression, and falls . Referrals and appointments  In addition, I have reviewed  and discussed with patient certain preventive protocols, quality metrics, and best practice recommendations. A written personalized care plan for preventive services as well as general preventive health recommendations were provided to patient.     Sheral Flow, LPN   [birth year]   Nurse Notes:  Patient is cogitatively intact. There were no vitals filed for this visit. There is no height or weight on file to calculate BMI. Patient stated that she has no issues with gait or balance; does not use any assistive devices.

## 2020-08-08 NOTE — Telephone Encounter (Signed)
Made pt aware of cea results

## 2020-08-08 NOTE — Patient Instructions (Signed)
Troy [name] , Thank you for taking time to come for your Medicare Wellness Visit. I appreciate your ongoing commitment to your health goals. Please review the following plan we discussed and let me know if I can assist you in the future.   Screening recommendations/referrals: Colonoscopy: 06/23/2020 Recommended yearly ophthalmology/optometry visit for glaucoma screening and checkup Recommended yearly dental visit for hygiene and checkup  Vaccinations: Influenza vaccine: not up to date Pneumococcal vaccine: need Pneumovax 23 Tdap vaccine: overdue Shingles vaccine: never done   Covid-19: up to date  Advanced directives: Please bring a copy of your health care power of attorney and living will to the office at your convenience.  Conditions/risks identified: Yes  Next appointment: Please schedule your next Medicare Wellness Visit with your Nurse Health Advisor in 1 year by calling [phone].  Preventive Care 80 Years and Older, Male Preventive care refers to lifestyle choices and visits with your health care provider that can promote health and wellness. What does preventive care include?  A yearly physical exam. This is also called an annual well check.  Dental exams once or twice a year.  Routine eye exams. Ask your health care provider how often you should have your eyes checked.  Personal lifestyle choices, including:  Daily care of your teeth and gums.  Regular physical activity.  Eating a healthy diet.  Avoiding tobacco and drug use.  Limiting alcohol use.  Practicing safe sex.  Taking low doses of aspirin every day.  Taking vitamin and mineral supplements as recommended by your health care provider. What happens during an annual well check? The services and screenings done by your health care provider during your annual well check will depend on your age, overall health, lifestyle risk factors, and family history of disease. Counseling  Your health care provider  may ask you questions about your:  Alcohol use.  Tobacco use.  Drug use.  Emotional well-being.  Home and relationship well-being.  Sexual activity.  Eating habits.  History of falls.  Memory and ability to understand (cognition).  Work and work Statistician. Screening  You may have the following tests or measurements:  Height, weight, and BMI.  Blood pressure.  Lipid and cholesterol levels. These may be checked every 5 years, or more frequently if you are over 61 years old.  Skin check.  Lung cancer screening. You may have this screening every year starting at age 7 if you have a 30-pack-year history of smoking and currently smoke or have quit within the past 15 years.  Fecal occult blood test (FOBT) of the stool. You may have this test every year starting at age 15.  Flexible sigmoidoscopy or colonoscopy. You may have a sigmoidoscopy every 5 years or a colonoscopy every 10 years starting at age 51.  Prostate cancer screening. Recommendations will vary depending on your family history and other risks.  Hepatitis C blood test.  Hepatitis B blood test.  Sexually transmitted disease (STD) testing.  Diabetes screening. This is done by checking your blood sugar (glucose) after you have not eaten for a while (fasting). You may have this done every 1-3 years.  Abdominal aortic aneurysm (AAA) screening. You may need this if you are a current or former smoker.  Osteoporosis. You may be screened starting at age 65 if you are at high risk. Talk with your health care provider about your test results, treatment options, and if necessary, the need for more tests. Vaccines  Your health care provider may recommend certain vaccines,  such as:  Influenza vaccine. This is recommended every year.  Tetanus, diphtheria, and acellular pertussis (Tdap, Td) vaccine. You may need a Td booster every 10 years.  Zoster vaccine. You may need this after age 37.  Pneumococcal 13-valent  conjugate (PCV13) vaccine. One dose is recommended after age 15.  Pneumococcal polysaccharide (PPSV23) vaccine. One dose is recommended after age 62. Talk to your health care provider about which screenings and vaccines you need and how often you need them. This information is not intended to replace advice given to you by your health care provider. Make sure you discuss any questions you have with your health care provider. Document Released: 10/14/2015 Document Revised: 06/06/2016 Document Reviewed: 07/19/2015 Elsevier Interactive Patient Education  2017 [name] Prevention in the Home Falls can cause injuries. They can happen to people of all ages. There are many things you can do to make your home safe and to help prevent falls. What can I do on the outside of my home?  Regularly fix the edges of walkways and driveways and fix any cracks.  Remove anything that might make you trip as you walk through a door, such as a raised step or threshold.  Trim any bushes or trees on the path to your home.  Use bright outdoor lighting.  Clear any walking paths of anything that might make someone trip, such as rocks or tools.  Regularly check to see if handrails are loose or broken. Make sure that both sides of any steps have handrails.  Any raised decks and porches should have guardrails on the edges.  Have any leaves, snow, or ice cleared regularly.  Use sand or salt on walking paths during winter.  Clean up any spills in your garage right away. This includes oil or grease spills. What can I do in the bathroom?  Use night lights.  Install grab bars by the toilet and in the tub and shower. Do not use towel bars as grab bars.  Use non-skid mats or decals in the tub or shower.  If you need to sit down in the shower, use a plastic, non-slip stool.  Keep the floor dry. Clean up any water that spills on the floor as soon as it happens.  Remove soap buildup in the tub or  shower regularly.  Attach bath mats securely with double-sided non-slip rug tape.  Do not have throw rugs and other things on the floor that can make you trip. What can I do in the bedroom?  Use night lights.  Make sure that you have a light by your bed that is easy to reach.  Do not use any sheets or blankets that are too big for your bed. They should not hang down onto the floor.  Have a firm chair that has side arms. You can use this for support while you get dressed.  Do not have throw rugs and other things on the floor that can make you trip. What can I do in the kitchen?  Clean up any spills right away.  Avoid walking on wet floors.  Keep items that you use a lot in easy-to-reach places.  If you need to reach something above you, use a strong step stool that has a grab bar.  Keep electrical cords out of the way.  Do not use floor polish or wax that makes floors slippery. If you must use wax, use non-skid floor wax.  Do not have throw rugs and other things on  the floor that can make you trip. What can I do with my stairs?  Do not leave any items on the stairs.  Make sure that there are handrails on both sides of the stairs and use them. Fix handrails that are broken or loose. Make sure that handrails are as long as the stairways.  Check any carpeting to make sure that it is firmly attached to the stairs. Fix any carpet that is loose or worn.  Avoid having throw rugs at the top or bottom of the stairs. If you do have throw rugs, attach them to the floor with carpet tape.  Make sure that you have a light switch at the top of the stairs and the bottom of the stairs. If you do not have them, ask someone to add them for you. What else can I do to help prevent falls?  Wear shoes that:  Do not have high heels.  Have rubber bottoms.  Are comfortable and fit you well.  Are closed at the toe. Do not wear sandals.  If you use a stepladder:  Make sure that it is fully  opened. Do not climb a closed stepladder.  Make sure that both sides of the stepladder are locked into place.  Ask someone to hold it for you, if possible.  Clearly mark and make sure that you can see:  Any grab bars or handrails.  First and last steps.  Where the edge of each step is.  Use tools that help you move around (mobility aids) if they are needed. These include:  Canes.  Walkers.  Scooters.  Crutches.  Turn on the lights when you go into a dark area. Replace any light bulbs as soon as they burn out.  Set up your furniture so you have a clear path. Avoid moving your furniture around.  If any of your floors are uneven, fix them.  If there are any pets around you, be aware of where they are.  Review your medicines with your doctor. Some medicines can make you feel dizzy. This can increase your chance of falling. Ask your doctor what other things that you can do to help prevent falls. This information is not intended to replace advice given to you by your health care provider. Make sure you discuss any questions you have with your health care provider. Document Released: 07/14/2009 Document Revised: 02/23/2016 Document Reviewed: 10/22/2014 Elsevier Interactive Patient Education  2017 [employer].

## 2020-08-09 ENCOUNTER — Encounter (HOSPITAL_COMMUNITY): Payer: Self-pay | Admitting: Cardiology

## 2020-08-09 ENCOUNTER — Ambulatory Visit (HOSPITAL_COMMUNITY)
Admission: RE | Admit: 2020-08-09 | Discharge: 2020-08-09 | Disposition: A | Discharge status: Home / Self Care | Payer: PPO | Source: Ambulatory Visit | Attending: Cardiology | Admitting: Cardiology

## 2020-08-09 ENCOUNTER — Other Ambulatory Visit: Payer: Self-pay

## 2020-08-09 VITALS — BP 160/88 | HR 60 | Wt 194.4 lb

## 2020-08-09 DIAGNOSIS — I480 Paroxysmal atrial fibrillation: Secondary | ICD-10-CM | POA: Insufficient documentation

## 2020-08-09 DIAGNOSIS — I130 Hypertensive heart and chronic kidney disease with heart failure and stage 1 through stage 4 chronic kidney disease, or unspecified chronic kidney disease: Secondary | ICD-10-CM | POA: Insufficient documentation

## 2020-08-09 DIAGNOSIS — Z7901 Long term (current) use of anticoagulants: Secondary | ICD-10-CM | POA: Insufficient documentation

## 2020-08-09 DIAGNOSIS — Z902 Acquired absence of lung [part of]: Secondary | ICD-10-CM | POA: Diagnosis not present

## 2020-08-09 DIAGNOSIS — Z79899 Other long term (current) drug therapy: Secondary | ICD-10-CM | POA: Insufficient documentation

## 2020-08-09 DIAGNOSIS — C187 Malignant neoplasm of sigmoid colon: Secondary | ICD-10-CM | POA: Diagnosis not present

## 2020-08-09 DIAGNOSIS — I5032 Chronic diastolic (congestive) heart failure: Secondary | ICD-10-CM | POA: Insufficient documentation

## 2020-08-09 DIAGNOSIS — Z87891 Personal history of nicotine dependence: Secondary | ICD-10-CM | POA: Diagnosis not present

## 2020-08-09 DIAGNOSIS — Z7951 Long term (current) use of inhaled steroids: Secondary | ICD-10-CM | POA: Insufficient documentation

## 2020-08-09 DIAGNOSIS — J90 Pleural effusion, not elsewhere classified: Secondary | ICD-10-CM | POA: Insufficient documentation

## 2020-08-09 DIAGNOSIS — I2510 Atherosclerotic heart disease of native coronary artery without angina pectoris: Secondary | ICD-10-CM | POA: Insufficient documentation

## 2020-08-09 DIAGNOSIS — I739 Peripheral vascular disease, unspecified: Secondary | ICD-10-CM

## 2020-08-09 DIAGNOSIS — Z8673 Personal history of transient ischemic attack (TIA), and cerebral infarction without residual deficits: Secondary | ICD-10-CM | POA: Diagnosis not present

## 2020-08-09 DIAGNOSIS — I6521 Occlusion and stenosis of right carotid artery: Secondary | ICD-10-CM | POA: Insufficient documentation

## 2020-08-09 DIAGNOSIS — J449 Chronic obstructive pulmonary disease, unspecified: Secondary | ICD-10-CM | POA: Insufficient documentation

## 2020-08-09 DIAGNOSIS — N183 Chronic kidney disease, stage 3 unspecified: Secondary | ICD-10-CM | POA: Diagnosis not present

## 2020-08-09 DIAGNOSIS — E785 Hyperlipidemia, unspecified: Secondary | ICD-10-CM | POA: Insufficient documentation

## 2020-08-09 DIAGNOSIS — I714 Abdominal aortic aneurysm, without rupture: Secondary | ICD-10-CM | POA: Insufficient documentation

## 2020-08-09 DIAGNOSIS — E0590 Thyrotoxicosis, unspecified without thyrotoxic crisis or storm: Secondary | ICD-10-CM | POA: Diagnosis not present

## 2020-08-09 DIAGNOSIS — G4733 Obstructive sleep apnea (adult) (pediatric): Secondary | ICD-10-CM | POA: Diagnosis not present

## 2020-08-09 HISTORY — DX: Heart failure, unspecified: I50.9

## 2020-08-09 LAB — CBC
HCT: 41.2 % (ref 39.0–52.0)
Hemoglobin: 13.3 g/dL (ref 13.0–17.0)
MCH: 30.2 pg (ref 26.0–34.0)
MCHC: 32.3 g/dL (ref 30.0–36.0)
MCV: 93.4 fL (ref 80.0–100.0)
Platelets: 260 10*3/uL (ref 150–400)
RBC: 4.41 MIL/uL (ref 4.22–5.81)
RDW: 15.1 % (ref 11.5–15.5)
WBC: 4.7 10*3/uL (ref 4.0–10.5)
nRBC: 0 % (ref 0.0–0.2)

## 2020-08-09 LAB — BASIC METABOLIC PANEL
Anion gap: 9 (ref 5–15)
BUN: 17 mg/dL (ref 8–23)
CO2: 26 mmol/L (ref 22–32)
Calcium: 9.6 mg/dL (ref 8.9–10.3)
Chloride: 104 mmol/L (ref 98–111)
Creatinine, Ser: 1.26 mg/dL — ABNORMAL HIGH (ref 0.61–1.24)
GFR, Estimated: 58 mL/min — ABNORMAL LOW (ref >60)
Glucose, Bld: 133 mg/dL — ABNORMAL HIGH (ref 70–99)
Potassium: 4.4 mmol/L (ref 3.5–5.1)
Sodium: 139 mmol/L (ref 135–145)

## 2020-08-09 LAB — CHOLESTEROL, TOTAL: Cholesterol: 129 mg/dL (ref 0–200)

## 2020-08-09 NOTE — Progress Notes (Signed)
Patient ID: Troy Labell [name], male   DOB: [birth year], 79 y.o.   MRN: [id number] PCP: Dr. Quay Burow Cardiology: [name]  79 y.o. with history of HTN, COPD, active smoking/COPD, carotid stenosis s/p right CEA, paroxysmal atrial fibrillation, and PAD returns for followup of CHF and atrial fibrillation.  He does not have known obstructive CAD but is at high risk for CAD based on his comorbidities.  Lexiscan Cardiolite in 3/14 showed no ischemia or infarction and echo in 3/14 showed normal EF.  He had a left fem-pop bypass at the [facility] in the '90s.  He is followed at [facility] for PAD. He has chronic right calf/thigh/buttocks claudication that is unchanged over the last few years and follows regularly at [facility].  He has had right lower lobectomy for lung cancer.  He continues to stay off cigarettes.    At a prior appointment, he was in atrial fibrillation but did not realize it.  I started him on Eliquis and diltiazem CD, and he spontaneously converted to NSR.  In 5/15, he was at [facility] and felt "strange" one day: fatigued, weak, short of breath.  He went to the ER and was in atrial fibrillation with HR in 80s-90s.  He spontaneously converted to NSR in the ER.  He felt back to normal after converting to NSR.  I started him on Multaq 400 mg bid.  With CHF, he was eventually transitioned over to amiodarone.   He had patch angioplasty revision of left fem-pop bypass in 11/16.  Now with minimal claudication.  He follows with [facility].     He had a Cardiolite and echo in 7/17 that were unremarkable.  Repeat Cardiolite in 12/17 showed no ischemia, echo was uninterpretable.  Cardiac MRI was therefore done in 1/18, showing EF 66% with normal-appearing RV, no late gadolinium enhancement.   Given increased exertional dyspnea and a defect on Cardiolite, he had LHC in 11/18.  This showed no obstructive CAD. He had a chest CT done given concern for possible amiodarone lung toxicity with increased dyspnea. This showed  emphysema, no ILD.    In 5/19, he developed a probable viral syndrome with dehydration and had a presyncopal episode.  He was orthostatic in the hospital.  He was noted to be in atrial fibrillation this admission which remained persistent.  He had a TEE-guided DCCV back to NSR.   In 9/19, he was admitted with GI bleeding and found to have a sigmoid mass on colonoscopy, path showed sigmoid adenocarcinoma.  He was noted to have right paratracheal lymphadenopathy but EUS with biopsy in 10/19 did not show malignancy.  He had surgical resection of colon cancer.   Amiodarone was stopped with elevated ESR and increased dyspnea.  He was also found to have hyperthyroidism. He was treated with methimazole by endocrinology.    He had atrial fibrillation ablation in 7/20.   Echo in 4/21 showed EF 60-65%, normal RV.    In 5/21, due to high atrial fibrillation burden, he had redo atrial fibrillation ablation.   Cardiolite in 6/21 showed EF 70%, no ischemia/infarction.   He returns today for followup of diastolic CHF and atrial fibrillation.  He is stable symptomatically.  Moving slowly with a somewhat shuffling gait. He has been walking laps in his driveway without exertional dyspnea.  He works out with a Physiological scientist at home.  No chest pain.  No palpitations.  Rare claudication.  He is in NSR today. He is using his CPAP.  Weight  is up 3 lbs.  BP is elevated today but he has not taken his meds yet.   ECG (personally reviewed): NSR, low voltage P waves  Labs (3/13): LDL 75, HDL 35, K 4.1, creatinine 1.1 Labs (3/14): LDL 91, HDL 27 Labs (10/14): K 4.3, creatinine 0.98 Labs (4/15): K 4.9, creatinine 1.1, LDL 76, HDL 30 Labs (5/15): K 4.3, creatinine 1.7, BNP 251 Labs (6/15): K 4.4, creatinine 1.3 Labs (10/15): TSH normal Labs (5/16): K 4.2, creatinine 1.12, HCT 39.5, LFTs normal, LDL 84, HDL 43, TSH normal Labs (11/16): creatinine 0.94 Labs (4/17): LDL 39, HDL 39 Labs (6/17): K 4.5, creatinine  1.2 Labs (9/17): K 4, creatinine 1.25, HCT 38.7 Labs (12/17): K 4.5, creatinine 1.49 => 1.62, BNP 43 Labs (2/18): K 3.8, creatinine 1.33, LFTs normal, TSH normal Labs (6/18): K 4.3, creatinine 1.49, LFTs normal, TSH normal, hgb 12.3 Labs (11/18): ESR 60, TSH normal, LFTs normal, creatinine 1.34 Labs (1/19): LDL 50 Labs (5/19): K 4.6, creatinine 1.55, LFTs normal, hgb 12.7, LFTs normal Labs (10/19): K 4 => 4.1, creatinine 1.39 => 1.43, BNP 279, hgb 10.7, TSH low, free T4 and free T3 low, ESR 108 Labs (3/21): K 4.5, creatinine 1.19, TSH normal Labs (4/21): K 4.8, creatinine 1.28, BNP 524, LDL 59 Labs (7/21): hgb 11.5, creatinine 1.4 Labs (8/21): K 4.3, creatinine 1.09 Labs (9/21): hgb 12.7  PMH: 1. HTN: Fatigue and cough with ramipril use.  2. COPD: Quit smoking 2014. PFTs (1/18) with moderately severe COPD.  - CT chest (11/18): Emphysema noted, no ILD.  3. AAA: CT 1/13 with 3.0 cm AAA.  Abdominal US (1/14) with 3.4 cm AAA. Abdominal US (4/15) with 3.25 x 3.27 AAA. Abdominal US (3/16) with 3.7 cm AAA.  Followed at [facility].  - Abd [name] (1/19): 3.3 cm AAA. 4. Squamous cell lung cancer diagnosed 2/12.  Had right lower lobectomy in 4/12.  5. Hyperlipidemia 6. PAD: Left fem-pop bypass 1994.  ABIs (2/12) 0.62 on right, 0.95 on left. ABIs (1/14): 0.65 on right, 1.04 on left. ABIs (4/15) 0.66 right, 0.87 left.  ABIs (3/16) 0.6 right, 0.88 left.  Patch angioplasty left fem-pop bypass in 11/16.   - Left fem-pop bypass patent on doppler evaluation in 12/17.  - ABIs (2/21): normal on left (1.02), moderately decreased on right (0.54).  7. CAD: Stress myoview 2004 was normal. Lexiscan Cardiolite (3/14) with EF 66%, no ischemia or infarction. Lexiscan Cardiolite (7/17) with EF 61%, no ischemia/infarction.  - Cardiolite (12/17) with EF 62%, inferior/inferolateral fixed defect, most likely diaphragmatic attenuation, no ischemia.  - Cardiolite (9/18) with EF > 65%, fixed inferior defect (attenuation versus  infarction), no ischemia.  - LHC (11/18): No obstructive CAD.  - Cardiolite (6/21): EF 70%, no ischemia/infarction 8. Chronic diastolic CHF: Echo (5/83): technically difficult with EF 55-60%, upper normal RV size.  Echo (5/16) with EF 60-65%.  Echo (7/17) with EF 55-60%, normal RV size and systolic function.  - Echo 12/17 with very poor windows, unable to comment on LV or RV function.  - Cardiac MRI (1/18) with EF 66%, normal RV size and systolic function.  - TEE (5/19): EF 55-60%, normal RV size and systolic function.  - Echo (4/21): EF 60-65%, normal RV 9. Carotid stenosis: TIA 10/14.  Carotid dopplers with > 09% RICA, 40-76% LICA.  Patient had right CEA in 10/14. Carotids (4/15) patent right CEA, LICA 80-88% stenosis.  Carotids (11/03) < 15% LICA, patent right CEA.  - Carotid dopplers (12/17): right CEA ok, <  01% LICA stenosis.  - Carotid dopplers (1/19): Right CEA ok, 7-79% LICA stenosis.  - Carotid dopplers (2/21): Right CEA ok, LICA 39-03% stenosis.  10. Atrial fibrillation: Paroxysmal. First noted after lobectomy in 4/12 (brief), recurrence in 4/15 then in 5/15.  - TEE-guided DCCV for persistent atrial fibrillation in 5/19.  - Atrial fibrillation ablation 7/20.  - Re-do atrial fibrillation ablation 5/21.  11. Spinal stenosis 12. OSA: Using CPAP.  13. Glaucoma 14. Has ILR 15. Colon cancer: Sigmoid adenocarcinoma, s/p surgical resection.  16. 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   SH: Married, lives in [place].  Former [place].  Quit smoking in 2014.  Rare ETOH now.   FH: No premature CAD  ROS: All systems reviewed and negative except as per HPI.   Current Outpatient Medications  Medication Sig Dispense Refill  . amLODipine (NORVASC) 5 MG tablet Take 1 tablet (5 mg total) by mouth daily. 30 tablet 6  . apixaban (ELIQUIS) 5 MG TABS tablet Take 1 tablet (5 mg total) by mouth 2 (two) times daily. 60 tablet 6  . Ascorbic Acid (VITAMIN C PO) Take  500 mg by mouth in the morning and at bedtime.     . Biotin 2500 MCG CAPS Take 2,500 mcg by mouth daily.    . Brinzolamide-Brimonidine (SIMBRINZA) 1-0.2 % SUSP Place 1 drop into both eyes 2 times daily.    . budesonide-formoterol (SYMBICORT) 160-4.5 MCG/ACT inhaler Inhale 2 puffs into the lungs 2 (two) times daily.    [name] docusate sodium (COLACE) 100 MG capsule Take 100 mg by mouth 2 (two) times daily.     [name] escitalopram (LEXAPRO) 10 MG tablet Take 1 tablet (10 mg total) by mouth daily. 30 tablet 5  . ferrous sulfate 325 (65 FE) MG tablet Take 325 mg by mouth 2 (two) times daily with a meal.     . latanoprost (XALATAN) 0.005 % ophthalmic solution     . losartan (COZAAR) 25 MG tablet TAKE (2) TABLETS BY MOUTH DAILY. 60 tablet 5  . Magnesium 500 MG CAPS Take 500 mg by mouth daily.     . Melatonin 5 MG TABS Take 5 mg by mouth at bedtime.    . metoprolol succinate (TOPROL-XL) 25 MG 24 hr tablet Take 0.5 tablets (12.5 mg total) by mouth at bedtime. 15 tablet 6  . montelukast (SINGULAIR) 10 MG tablet TAKE ONE TABLET AT BEDTIME. 30 tablet 5  . Multiple Vitamins-Minerals (CENTRUM SILVER PO) Take 1 tablet by mouth daily.    . Multiple Vitamins-Minerals (ZINC PO) Take 1 tablet by mouth daily.    [name] omeprazole (PRILOSEC) 40 MG capsule Take 1 capsule (40 mg total) by mouth in the morning and at bedtime. 40 mg twice daily for 30 days and then drop down to 20 mg twice daily 90 capsule 3  . PEG-KCl-NaCl-NaSulf-Na Asc-C (PLENVU) 140 g SOLR Take 1 kit by mouth as directed. Pt has a manufacturers coupon to bring to  Pharmacy 1 each 0  . Polyethyl Glycol-Propyl Glycol (SYSTANE) 0.4-0.3 % SOLN Place 1 drop into both eyes daily as needed (for dry eyes).     . potassium chloride SA (KLOR-CON) 20 MEQ tablet Take 1 tablet (20 mEq total) by mouth daily. 30 tablet 6  . rosuvastatin (CRESTOR) 40 MG tablet Take 1 tablet (40 mg total) by mouth daily. 30 tablet 6  . senna (SENOKOT) 8.6 MG tablet Take 1 tablet by mouth 2 (two)  times daily.    [name]  furosemide (LASIX) 40 MG tablet Take 1 tablet (40 mg total) by mouth 2 (two) times daily. 60 tablet 3   No current facility-administered medications for this encounter.    BP (!) 160/88   Pulse 60   Wt 88.2 kg (194 lb 6.4 oz)   SpO2 95%   BMI 25.30 kg/m  General: NAD Neck: No JVD, no thyromegaly or thyroid nodule.  Lungs: Decreased BS right base.  CV: Nondisplaced PMI.  Heart regular S1/S2, no S3/S4, no murmur.  No peripheral edema.  No carotid bruit.  Unable to palpate pedal pulses.  Abdomen: Soft, nontender, no hepatosplenomegaly, no distention.  Skin: Intact without lesions or rashes.  Neurologic: Alert and oriented x 3.  Psych: Normal affect. Extremities: No clubbing or cyanosis.  HEENT: Normal.   Assessment/Plan: 1. Chronic diastolic CHF: Echo in 6/75 with EF 60-65%, normal RV.  NYHA class II symptoms.  Not volume overloaded on exam.  - Continue Lasix 40 mg bid, BMET today.    2. Hyperlipidemia: Continue Crestor.  Check lipids today.  3. Carotid stenosis: s/p R CEA.  Dopplers followed at [facility].  4. PAD: s/p patch angioplasty to left fem-pop bypass in 11/16.  Followed at [facility].  Moderately decreased ABI on right in 2/21, but no pain or pedal ulcerations.  Stable mild claudication.  5. AAA: Stable on last [name], followed at [facility].    6. COPD: No longer smoking. PFTs in 1/18 showed moderately severe obstructive defect. CT chest in 11/18 showed emphysema.   7. Atrial fibrillation: S/p atrial fibrillation ablation in 7/20 and redo in 5/21.  He has been primarily in NSR since redo ablation, NSR today.   - Continue Toprol XL 12.5 mg daily.  - Continue Eliquis 5 mg bid, CBC today.  - Continue to limit ETOH.  - Continue to use CPAP.  8. CKD: Stage 3. BMET today.   9. CAD: LHC in 11/18 with nonobstructive disease only.  Cardiolite in 6/21 with no ischemia/infarction. No chest pain.  10. 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  He is no longer on  methimazole, follows with endocrinology.     11. Colon cancer: Sigmoid adenocarcinoma, s/p surgical resection. In remission.  12. HTN: BP elevated today but has not taken amlodipine or losartan yet.  SBP has been around 130 at other recent MD appts.  13. Pleural effusion: Chronic right pleural effusion, s/p right lower lobectomy.     Followup in 4 months.     [name] 08/09/2020

## 2020-08-09 NOTE — Patient Instructions (Signed)
Labs done today, your results will be available in MyChart, we will contact you for abnormal readings.  Your physician recommends that you schedule a follow-up appointment in: 4 months  If you have any questions or concerns before your next appointment please send [name] a message through [place] or call our office at [phone].    TO LEAVE A MESSAGE FOR THE NURSE SELECT OPTION 2, PLEASE LEAVE A MESSAGE INCLUDING: . YOUR NAME . DATE OF BIRTH . CALL BACK NUMBER . REASON FOR CALL**this is important as we prioritize the call backs  [place] AS LONG AS YOU CALL BEFORE 4:00 PM  At the [facility], you and your health needs are our priority. As part of our continuing mission to provide you with exceptional heart care, we have created designated Provider Care Teams. These Care Teams include your primary Cardiologist (physician) and Advanced Practice Providers (APPs- Physician Assistants and Nurse Practitioners) who all work together to provide you with the care you need, when you need it.   You may see any of the following providers on your designated Care Team at your next follow up: [name] Dr Glori Bickers . Dr [name] . Darrick Grinder, NP . Lyda Jester, PA . Audry Riles, PharmD   Please be sure to bring in all your medications bottles to every appointment.

## 2020-08-11 ENCOUNTER — Ambulatory Visit (INDEPENDENT_AMBULATORY_CARE_PROVIDER_SITE_OTHER): Payer: PPO

## 2020-08-11 ENCOUNTER — Other Ambulatory Visit: Payer: Self-pay

## 2020-08-11 DIAGNOSIS — Z23 Encounter for immunization: Secondary | ICD-10-CM | POA: Diagnosis not present

## 2020-08-15 ENCOUNTER — Telehealth: Payer: Self-pay | Admitting: Internal Medicine

## 2020-08-15 NOTE — Telephone Encounter (Signed)
   Patient's daughter calling to verify med list Please call

## 2020-08-17 ENCOUNTER — Ambulatory Visit: Payer: PPO | Admitting: Internal Medicine

## 2020-08-17 ENCOUNTER — Other Ambulatory Visit: Payer: Self-pay

## 2020-08-17 ENCOUNTER — Encounter: Payer: Self-pay | Admitting: Internal Medicine

## 2020-08-17 VITALS — BP 138/70 | HR 62 | Ht 73.5 in | Wt 192.0 lb

## 2020-08-17 DIAGNOSIS — I480 Paroxysmal atrial fibrillation: Secondary | ICD-10-CM

## 2020-08-17 DIAGNOSIS — G4733 Obstructive sleep apnea (adult) (pediatric): Secondary | ICD-10-CM | POA: Diagnosis not present

## 2020-08-17 DIAGNOSIS — I5032 Chronic diastolic (congestive) heart failure: Secondary | ICD-10-CM | POA: Diagnosis not present

## 2020-08-17 NOTE — Patient Instructions (Signed)
Medication Instructions:  Your physician recommends that you continue on your current medications as directed. Please refer to the Current Medication list given to you today.  *If you need a refill on your cardiac medications before your next appointment, please call your pharmacy*  Lab Work: None ordered.  If you have labs (blood work) drawn today and your tests are completely normal, you will receive your results only by: [name] MyChart Message (if you have MyChart) OR . A paper copy in the mail If you have any lab test that is abnormal or we need to change your treatment, we will call you to review the results.  Testing/Procedures: None ordered.  Follow-Up: At [facility], you and your health needs are our priority.  As part of our continuing mission to provide you with exceptional heart care, we have created designated Provider Care Teams.  These Care Teams include your primary Cardiologist (physician) and Advanced Practice Providers (APPs -  Physician Assistants and Nurse Practitioners) who all work together to provide you with the care you need, when you need it.  We recommend signing up for the patient portal called "MyChart".  Sign up information is provided on this After Visit Summary.  MyChart is used to connect with patients for Virtual Visits (Telemedicine).  Patients are able to view lab/test results, encounter notes, upcoming appointments, etc.  Non-urgent messages can be sent to your provider as well.   To learn more about what you can do with MyChart, go to [email or web address].    Your next appointment:   Your physician wants you to follow-up in: 6 months with [name]. You will receive a reminder letter in the mail two months in advance. If you don't receive a letter, please call our office to schedule the follow-up appointment.    Other Instructions:

## 2020-08-17 NOTE — Telephone Encounter (Signed)
Spoke with patient today. 

## 2020-08-17 NOTE — Progress Notes (Signed)
PCP: Binnie Rail, MD Primary Cardiologist: Dr Aundra Dubin Primary EP: Dr Rayann Heman  [name] is a 79 y.o. male who presents today for routine electrophysiology followup.  Since last being seen in our clinic, the patient reports doing very well.  Today, he denies symptoms of palpitations, chest pain, shortness of breath,  lower extremity edema, dizziness, presyncope, or syncope.  The patient is otherwise without complaint today.   Past Medical History:  Diagnosis Date  . AAA (abdominal aortic aneurysm) ([place]) LAST ABDOMINAL US 10-20-17 3.3 CM   MONITORED BY DR [name]  . Allergy   . Anxiety   . Arthritis   . Atrial fibrillation ([place])   . Basal cell carcinoma   . Bilateral carotid artery stenosis DUPLEX 12-29-2012  BY DR [facility]   BILATERAL ICA STENOSIS 60-79%  . BPH (benign prostatic hypertrophy)   . Cataract    removed both eyes  . CHF (congestive heart failure) ([place])   . Chronic diastolic heart failure ([place])   . Chronic kidney disease    stage 3, pt unaware  . Colon cancer ([place])   . Complication of anesthesia    had nausea after carotid artery surgery, states the medicine given to help the nausea, made it worse  . Constipation   . COPD (chronic obstructive pulmonary disease) ([place])   . Depression   . Emphysema of lung ([place])   . GERD (gastroesophageal reflux disease)   . Glaucoma BOTH EYES   Dr Gershon Crane  . History of basal cell carcinoma excision   . History of lung cancer APRIL 2012  SQUAMOUS CELL---- S/P RIGHT LOWER LOBECTOMY AT [facility] --  NO CHEMORADIATION---  NO RECURRENCE    ONCOLOGIST- DR Tressie Stalker  LOV [facility] 10-27-2012  . History of pneumothorax    pt unaware  . Hx of adenomatous colonic polyps 2005    X 2; 1 hyperplastic polyp; Dr Olevia Perches  . Hyperlipidemia   . Hypertension   . Impaired fasting glucose 2007   108; A1c5.4%  . Lesion of bladder   . Lung cancer ([place]) 01/25/2012  . Microhematuria   . OSA (obstructive sleep apnea) 08/29/2015   CPAP SET ON  10  . PAD (peripheral artery disease) ([place]) ABI'S  JAN 2014  0.65 ON RIGHT ;  1.04 ON LEFT  . Peripheral vascular disease ([place]) S/P ANGIOPLASTY AND STENTING   FOLLOWED  BY DR [name]  . Pleural effusion on right 11/18/2018   Chronic, noted on CXR  . Sleep apnea    + cpap   . Spinal stenosis Sept. 2015  . Status post placement of implantable loop recorder   . Stroke [facility]) Jul 07, 2013   mini  TIA  . Thoracic aorta atherosclerosis ([place])   . Thyrotoxicosis    amiodarone induced  . Urgency of urination    Past Surgical History:  Procedure Laterality Date  . ANGIO PLASTY     X 4 in legs  . AORTOGRAM  07-27-2002   MILD DIFFUSE ILIAC ARTERY OCCLUSIVE DISEASE /  LEFT RENAL ARTERY 20%/ PATENT LEFT FEM-POP GRAFT/ MILD SFA AND POPLITEAL ARTERY OCCLUSIVE DISEASE W/ SEVERE KIDNEY OCCLUSIVE DISEASE  . ATRIAL FIBRILLATION ABLATION N/A 04/09/2019   Procedure: ATRIAL FIBRILLATION ABLATION;  Surgeon: Thompson Grayer, MD;  Location: [place] CV LAB;  Service: Cardiovascular;  Laterality: N/A;  . ATRIAL FIBRILLATION ABLATION N/A 02/12/2020   Procedure: ATRIAL FIBRILLATION ABLATION;  Surgeon: Thompson Grayer, MD;  Location: [place] CV LAB;  Service: Cardiovascular;  Laterality: N/A;  . BASAL CELL CARCINOMA EXCISION     MULTIPLE TIMES--  RIGHT FOREARM, CHEEKS, AND BACK  . BIOPSY  06/25/2018   Procedure: BIOPSY;  Surgeon: Meridee Score Netty Starring., MD;  Location: [facility] ENDOSCOPY;  Service: Gastroenterology;;  . CARDIOVASCULAR STRESS TEST  12-08-2012  DR MCLEAN   NORMAL LEXISCAN WITH NO EXERCISE NUCLEAR STUDY/ EF 66%/   NO ISCHEMIA/ NO SIGNIFICANT CHANGE FROM PRIOR STUDY  . CARDIOVERSION N/A 02/14/2018   Procedure: CARDIOVERSION;  Surgeon: Laurey Morale, MD;  Location: [facility] ENDOSCOPY;  Service: Cardiovascular;  Laterality: N/A;  . CAROTID ANGIOGRAM N/A 07/10/2013   Procedure: CAROTID ANGIOGRAM;  Surgeon: Sherren Kerns, MD;  Location: [facility] CATH LAB;  Service: Cardiovascular;  Laterality: N/A;  . CAROTID  ENDARTERECTOMY Right 07-14-13   cea  . CATARACT EXTRACTION W/ INTRAOCULAR LENS  IMPLANT, BILATERAL    . colon polyectomy    . COLONOSCOPY    . COLONOSCOPY WITH PROPOFOL N/A 06/25/2018   Procedure: COLONOSCOPY WITH PROPOFOL;  Surgeon: Meridee Score Netty Starring., MD;  Location: [facility] ENDOSCOPY;  Service: Gastroenterology;  Laterality: N/A;  . CYSTOSCOPY W/ RETROGRADES Bilateral 01/21/2013   Procedure: CYSTOSCOPY WITH RETROGRADE PYELOGRAM;  Surgeon: Milford Cage, MD;  Location: [facility];  Service: Urology;  Laterality: Bilateral;   CYSTO, BLADDER BIOPSY, BILATERAL RETROGRADE PYELOGRAM  RAD TECH FROM RADIOLOGY PER JOY  . CYSTOSCOPY WITH BIOPSY N/A 01/21/2013   Procedure: CYSTOSCOPY WITH BIOPSY;  Surgeon: Milford Cage, MD;  Location: [facility];  Service: Urology;  Laterality: N/A;  . ENDARTERECTOMY Right 07/14/2013   Procedure: ENDARTERECTOMY CAROTID;  Surgeon: Chuck Hint, MD;  Location: [facility] OR;  Service: Vascular;  Laterality: Right;  . EP IMPLANTABLE DEVICE N/A 08/12/2015   Procedure: Loop Recorder Insertion;  Surgeon: Hillis Range, MD;  Location: [facility] INVASIVE CV LAB;  Service: Cardiovascular;  Laterality: N/A;  . ESOPHAGOGASTRODUODENOSCOPY (EGD) WITH PROPOFOL N/A 06/25/2018   Procedure: ESOPHAGOGASTRODUODENOSCOPY (EGD) WITH PROPOFOL;  Surgeon: Meridee Score Netty Starring., MD;  Location: [facility] ENDOSCOPY;  Service: Gastroenterology;  Laterality: N/A;  . ESOPHAGOGASTRODUODENOSCOPY (EGD) WITH PROPOFOL N/A 07/10/2018   Procedure: ESOPHAGOGASTRODUODENOSCOPY (EGD) WITH PROPOFOL;  Surgeon: Rachael Fee, MD;  Location: [facility] ENDOSCOPY;  Service: Endoscopy;  Laterality: N/A;  . EUS N/A 07/10/2018   Procedure: UPPER ENDOSCOPIC ULTRASOUND (EUS) RADIAL;  Surgeon: Rachael Fee, MD;  Location: [facility] ENDOSCOPY;  Service: Endoscopy;  Laterality: N/A;  . EYE SURGERY Right   . FEMORAL-POPLITEAL BYPASS GRAFT Left 1994  [facility]   AND 2001  IN [place]  .  FEMORAL-POPLITEAL BYPASS GRAFT Left 08/30/2015   Procedure: REVISION OF BYPASS GRAFT Left  FEMORAL-POPLITEAL ARTERY;  Surgeon: Chuck Hint, MD;  Location[facility] OR;  Service: Vascular;  Laterality: Left;  . FINE NEEDLE ASPIRATION N/A 07/10/2018   Procedure: FINE NEEDLE ASPIRATION (FNA) LINEAR;  Surgeon: Rachael Fee, MD;  Location: [facility] ENDOSCOPY;  Service: Endoscopy;  Laterality: N/A;  . FLEXIBLE SIGMOIDOSCOPY N/A 12/12/2018   Procedure: FLEXIBLE SIGMOIDOSCOPY;  Surgeon: Andria Meuse, MD;  Location: [facility] ORS;  Service: General;  Laterality: N/A;  . LARYNGOSCOPY  06-27-2004   BX VOCAL CORD  (LEUKOPLAKIA)  PER PT NO ISSUES SINCE  . LEFT HEART CATH AND CORONARY ANGIOGRAPHY N/A 08/20/2017   Procedure: LEFT HEART CATH AND CORONARY ANGIOGRAPHY;  Surgeon: Laurey Morale, MD;  [facility] INVASIVE CV LAB;  Service: Cardiovascular;  Laterality: N/A;  . LOOP RECORDER INSERTION N/A 04/09/2019   Procedure: LOOP RECORDER INSERTION;  Surgeon: Johney Frame,  Fayrene Fearing, MD;  Location: [facility] INVASIVE CV LAB;  Service: Cardiovascular;  Laterality: N/A;  . LOOP RECORDER REMOVAL N/A 04/09/2019   Procedure: LOOP RECORDER REMOVAL;  Surgeon: Hillis Range, MD;  Location: [facility] INVASIVE CV LAB;  Service: Cardiovascular;  Laterality: N/A;  . LOWER EXTREMITY ANGIOGRAM Bilateral 08/29/2015   Procedure: Lower Extremity Angiogram;  Surgeon: Chuck Hint, MD;  Location: [facility] INVASIVE CV LAB;  Service: Cardiovascular;  Laterality: Bilateral;  . LUNG LOBECTOMY  01/24/2011    RIGHT UPPER LOBE  (SQUAMOUS CELL CARCINOMA) Dr Desmond Lope , [facility]. No chemotherapyor radiation  . PATCH ANGIOPLASTY Right 07/14/2013   Procedure: PATCH ANGIOPLASTY;  Surgeon: Chuck Hint, MD;  Location: [facility] OR;  Service: Vascular;  Laterality: Right;  . PATCH ANGIOPLASTY Left 08/30/2015   Procedure: VEIN PATCH ANGIOPLASTY OF PROXIMAL Left BYPASS GRAFT;  Surgeon: Chuck Hint, MD;  Location: [facility] OR;  Service: Vascular;  Laterality: Left;  .  PERIPHERAL VASCULAR CATHETERIZATION N/A 08/29/2015   Procedure: Abdominal Aortogram;  Surgeon: Chuck Hint, MD;  Location: [facility] INVASIVE CV LAB;  Service: Cardiovascular;  Laterality: N/A;  . POLYPECTOMY  06/25/2018   Procedure: POLYPECTOMY;  Surgeon: Meridee Score Netty Starring., MD;  Location: [facility] ENDOSCOPY;  Service: Gastroenterology;;  . POLYPECTOMY    . SUBMUCOSAL INJECTION  06/25/2018   Procedure: SUBMUCOSAL INJECTION;  Surgeon: Meridee Score Netty Starring., MD;  Location: [facility] ENDOSCOPY;  Service: Gastroenterology;;  . TEE WITHOUT CARDIOVERSION N/A 02/14/2018   Procedure: TRANSESOPHAGEAL ECHOCARDIOGRAM (TEE);  Surgeon: Laurey Morale, MD;  Location: [facility] ENDOSCOPY;  Service: Cardiovascular;  Laterality: N/A;  . trabecular surgery     OS  . TRANSTHORACIC ECHOCARDIOGRAM  12-29-2012  DR [facility]   MILD LVH/  LVSF NORMAL/ EF 55-60%/  GRADE I DIASTOLIC DYSFUNCTION    ROS- all systems are reviewed and negatives except as per HPI above  Current Outpatient Medications  Medication Sig Dispense Refill  . amLODipine (NORVASC) 5 MG tablet Take 1 tablet (5 mg total) by mouth daily. 30 tablet 6  . apixaban (ELIQUIS) 5 MG TABS tablet Take 1 tablet (5 mg total) by mouth 2 (two) times daily. 60 tablet 6  . Ascorbic Acid (VITAMIN C PO) Take 500 mg by mouth in the morning and at bedtime.     . Biotin 2500 MCG CAPS Take 2,500 mcg by mouth daily.    . Brinzolamide-Brimonidine (SIMBRINZA) 1-0.2 % SUSP Place 1 drop into both eyes 2 times daily.    . budesonide-formoterol (SYMBICORT) 160-4.5 MCG/ACT inhaler Inhale 2 puffs into the lungs 2 (two) times daily.    [name] docusate sodium (COLACE) 100 MG capsule Take 100 mg by mouth 2 (two) times daily.     [name] escitalopram (LEXAPRO) 10 MG tablet Take 1 tablet (10 mg total) by mouth daily. 30 tablet 5  . ferrous sulfate 325 (65 FE) MG tablet Take 325 mg by mouth 2 (two) times daily with a meal.     . furosemide (LASIX) 40 MG tablet Take 1 tablet (40 mg total) by mouth 2 (two)  times daily. 60 tablet 3  . latanoprost (XALATAN) 0.005 % ophthalmic solution     . losartan (COZAAR) 25 MG tablet TAKE (2) TABLETS BY MOUTH DAILY. 60 tablet 5  . Magnesium 500 MG CAPS Take 500 mg by mouth daily.     . Melatonin 5 MG TABS Take 5 mg by mouth at bedtime.    . metoprolol succinate (TOPROL-XL) 25 MG 24 hr tablet Take 0.5 tablets (12.5 mg total) by mouth  at bedtime. 15 tablet 6  . montelukast (SINGULAIR) 10 MG tablet TAKE ONE TABLET AT BEDTIME. 30 tablet 5  . Multiple Vitamins-Minerals (CENTRUM SILVER PO) Take 1 tablet by mouth daily.    . Multiple Vitamins-Minerals (ZINC PO) Take 1 tablet by mouth daily.    [name] omeprazole (PRILOSEC) 40 MG capsule Take 1 capsule (40 mg total) by mouth in the morning and at bedtime. 40 mg twice daily for 30 days and then drop down to 20 mg twice daily 90 capsule 3  . PEG-KCl-NaCl-NaSulf-Na Asc-C (PLENVU) 140 g SOLR Take 1 kit by mouth as directed. Pt has a manufacturers coupon to bring to  Pharmacy 1 each 0  . Polyethyl Glycol-Propyl Glycol (SYSTANE) 0.4-0.3 % SOLN Place 1 drop into both eyes daily as needed (for dry eyes).     . potassium chloride SA (KLOR-CON) 20 MEQ tablet Take 1 tablet (20 mEq total) by mouth daily. 30 tablet 6  . rosuvastatin (CRESTOR) 40 MG tablet Take 1 tablet (40 mg total) by mouth daily. 30 tablet 6  . senna (SENOKOT) 8.6 MG tablet Take 1 tablet by mouth 2 (two) times daily.     No current facility-administered medications for this visit.    Physical Exam: Vitals:   08/17/20 1159  BP: 138/70  Pulse: 62  SpO2: 95%  Weight: 192 lb (87.1 kg)  Height: 6' 1.5" (1.867 m)    GEN- The patient is elderly appearing, alert and oriented x 3 today. Walks slowly with a shuffling gait  Head- normocephalic, atraumatic Eyes-  Sclera clear, conjunctiva pink Ears- hearing intact Oropharynx- clear Lungs-   normal work of breathing Heart- Regular rate and rhythm  GI- soft  Extremities- no clubbing, cyanosis, or edema  Wt Readings  from Last 3 Encounters:  08/17/20 192 lb (87.1 kg)  08/09/20 194 lb 6.4 oz (88.2 kg)  08/05/20 194 lb 4.8 oz (88.1 kg)    EKG tracing ordered today is personally reviewed and shows sinus with PACs  Assessment and Plan:  1. Paroxysmal atrial fibrillation afib burden is 0% post ablation by ILR chads2vasc score is 6.  He is on eliquis  2. Chronic diastolic dysfunction Stable No change required today  3. OSA Compliance with CPAP advised  4. HL He is on crestor  Risks, benefits and potential toxicities for medications prescribed and/or refilled reviewed with patient today.   Return in 6 months  Thompson Grayer MD, [facility] 08/17/2020 12:09 PM

## 2020-08-18 ENCOUNTER — Encounter (HOSPITAL_COMMUNITY): Payer: Self-pay

## 2020-08-23 ENCOUNTER — Ambulatory Visit: Payer: PPO | Admitting: Gastroenterology

## 2020-08-30 ENCOUNTER — Other Ambulatory Visit (HOSPITAL_COMMUNITY): Payer: Self-pay | Admitting: Cardiology

## 2020-08-30 DIAGNOSIS — R0602 Shortness of breath: Secondary | ICD-10-CM

## 2020-08-30 DIAGNOSIS — I509 Heart failure, unspecified: Secondary | ICD-10-CM

## 2020-09-03 LAB — CUP PACEART REMOTE DEVICE CHECK
Date Time Interrogation Session: 20211127231453
Implantable Pulse Generator Implant Date: 20200709

## 2020-09-05 ENCOUNTER — Other Ambulatory Visit: Payer: Self-pay | Admitting: Gastroenterology

## 2020-09-05 ENCOUNTER — Ambulatory Visit (INDEPENDENT_AMBULATORY_CARE_PROVIDER_SITE_OTHER): Payer: PPO

## 2020-09-05 DIAGNOSIS — I480 Paroxysmal atrial fibrillation: Secondary | ICD-10-CM | POA: Diagnosis not present

## 2020-09-15 NOTE — Progress Notes (Signed)
Carelink Summary Report / Loop Recorder 

## 2020-09-21 DIAGNOSIS — H401133 Primary open-angle glaucoma, bilateral, severe stage: Secondary | ICD-10-CM | POA: Diagnosis not present

## 2020-09-26 ENCOUNTER — Other Ambulatory Visit: Payer: Self-pay | Admitting: Gastroenterology

## 2020-09-26 ENCOUNTER — Other Ambulatory Visit (HOSPITAL_COMMUNITY): Payer: Self-pay | Admitting: Cardiology

## 2020-09-26 DIAGNOSIS — R0602 Shortness of breath: Secondary | ICD-10-CM

## 2020-09-26 DIAGNOSIS — I509 Heart failure, unspecified: Secondary | ICD-10-CM

## 2020-10-10 ENCOUNTER — Ambulatory Visit (INDEPENDENT_AMBULATORY_CARE_PROVIDER_SITE_OTHER): Payer: PPO

## 2020-10-10 DIAGNOSIS — I480 Paroxysmal atrial fibrillation: Secondary | ICD-10-CM

## 2020-10-10 LAB — CUP PACEART REMOTE DEVICE CHECK
Date Time Interrogation Session: 20220108231331
Implantable Pulse Generator Implant Date: 20200709

## 2020-10-18 ENCOUNTER — Other Ambulatory Visit (INDEPENDENT_AMBULATORY_CARE_PROVIDER_SITE_OTHER): Payer: PPO

## 2020-10-18 ENCOUNTER — Encounter: Payer: Self-pay | Admitting: Gastroenterology

## 2020-10-18 ENCOUNTER — Ambulatory Visit (INDEPENDENT_AMBULATORY_CARE_PROVIDER_SITE_OTHER): Payer: PPO | Admitting: Gastroenterology

## 2020-10-18 VITALS — BP 142/80 | HR 68 | Ht 73.5 in | Wt 204.0 lb

## 2020-10-18 DIAGNOSIS — E611 Iron deficiency: Secondary | ICD-10-CM | POA: Diagnosis not present

## 2020-10-18 DIAGNOSIS — Z862 Personal history of diseases of the blood and blood-forming organs and certain disorders involving the immune mechanism: Secondary | ICD-10-CM

## 2020-10-18 DIAGNOSIS — Z85038 Personal history of other malignant neoplasm of large intestine: Secondary | ICD-10-CM

## 2020-10-18 DIAGNOSIS — Z8719 Personal history of other diseases of the digestive system: Secondary | ICD-10-CM

## 2020-10-18 DIAGNOSIS — Z7901 Long term (current) use of anticoagulants: Secondary | ICD-10-CM | POA: Diagnosis not present

## 2020-10-18 DIAGNOSIS — Z86010 Personal history of colonic polyps: Secondary | ICD-10-CM

## 2020-10-18 DIAGNOSIS — K31A0 Gastric intestinal metaplasia, unspecified: Secondary | ICD-10-CM

## 2020-10-18 LAB — CBC
HCT: 41.1 % (ref 39.0–52.0)
Hemoglobin: 13.9 g/dL (ref 13.0–17.0)
MCHC: 33.8 g/dL (ref 30.0–36.0)
MCV: 91.5 fl (ref 78.0–100.0)
Platelets: 278 10*3/uL (ref 150.0–400.0)
RBC: 4.5 Mil/uL (ref 4.22–5.81)
RDW: 14.9 % (ref 11.5–15.5)
WBC: 6.1 10*3/uL (ref 4.0–10.5)

## 2020-10-18 LAB — IBC + FERRITIN
Ferritin: 160.6 ng/mL (ref 22.0–322.0)
Iron: 88 ug/dL (ref 42–165)
Saturation Ratios: 25.1 % (ref 20.0–50.0)
Transferrin: 250 mg/dL (ref 212.0–360.0)

## 2020-10-18 NOTE — Progress Notes (Signed)
[facility] VISIT   Primary Care Provider Binnie Rail, MD [place] [address] [phone]  Patient Profile: Troy [name]. is a 80 y.o. male with a pmh significant for Colon cancer status post LAR, colon polyps, diverticulosis, cholelithiasis, gastritis with intestinal metaplasia, chronic renal insufficiency, carotid artery stenosis, history of prior BCC, A. fib (on anticoagulation), BPH, prior lung cancer, sleep apnea, peripheral arterial disease, hypertension, hyperlipidemia, iron deficiency.  The patient presents to the [facility] for an evaluation and management of problem(s) noted below:  Problem List 1. Personal history of colon cancer   2. Hx of adenomatous colonic polyps   3. Gastric intestinal metaplasia   4. History of gastric polyps   5. Iron deficiency   6. History of anemia   7. Chronic anticoagulation     History of Present Illness Please see initial consultation note for full details of HPI.  Interval History Today, the patient returns for a scheduled follow-up.  Overall, the patient has done well.  The patient describes no significant GI issues at this time.  He is having bowel movements on a daily basis.  He has had a slight decrease in his energy overall but thinks things are stabilizing at this time.  He has not noted any overt GI bleeding (melena/hematochezia/maroon stools).  The patient denies significant abdominal pain.  No issues since his upper endoscopy and colonoscopy in September of last year.  He has history of intestinal metaplasia was mapped with plan for a normal 3-year follow-up endoscopy however he had evidence of what appeared to be inflammatory polyps with one that had recent oozing and that was resected with the hope that this would aid in his iron deficiency.  Multiple tubular adenomas were removed during his colonoscopy.  The plan for colon cancer screening was for follow-up  in 3 years.  He continues taking PPI however he is not clear on his dosing of whether he is taking 20 mg or 40 mg although our records had wanted him to go to 20 mg twice daily.  GI Review of Systems Positive as above Negative for pyrosis, odynophagia, dysphagia, nausea, vomiting, change in bowel habits   Review of Systems General: Denies fevers/chills/weight loss unintentionally Cardiovascular: Denies chest pain Pulmonary: Denies shortness of breath Gastroenterological: See HPI Genitourinary: Denies darkened urine Hematological: Denies easy bruising/bleeding Dermatological: Denies jaundice Psychological: Mood is stable   Medications Current Outpatient Medications  Medication Sig Dispense Refill  . amLODipine (NORVASC) 5 MG tablet TAKE 1 TABLET ONCE DAILY. 28 tablet 0  . Ascorbic Acid (VITAMIN C PO) Take 500 mg by mouth in the morning and at bedtime.     . Biotin 2500 MCG CAPS Take 2,500 mcg by mouth daily.    . Brinzolamide-Brimonidine (SIMBRINZA) 1-0.2 % SUSP Place 1 drop into both eyes 2 times daily.    . budesonide-formoterol (SYMBICORT) 160-4.5 MCG/ACT inhaler Inhale 2 puffs into the lungs 2 (two) times daily.    [name] docusate sodium (COLACE) 100 MG capsule Take 100 mg by mouth 2 (two) times daily.     [name] ELIQUIS 5 MG TABS tablet TAKE 1 TABLET BY MOUTH TWICE DAILY. 56 tablet 0  . escitalopram (LEXAPRO) 10 MG tablet Take 1 tablet (10 mg total) by mouth daily. 30 tablet 5  . ferrous sulfate 325 (65 FE) MG tablet Take 325 mg by mouth 2 (two) times daily with a meal.     . furosemide (LASIX) 40 MG tablet  Take 1 tablet (40 mg total) by mouth 2 (two) times daily. 60 tablet 3  . latanoprost (XALATAN) 0.005 % ophthalmic solution     . losartan (COZAAR) 25 MG tablet TAKE (2) TABLETS BY MOUTH DAILY. 60 tablet 5  . Magnesium 500 MG CAPS Take 500 mg by mouth daily.     . Melatonin 5 MG TABS Take 5 mg by mouth at bedtime.    . metoprolol succinate (TOPROL-XL) 25 MG 24 hr tablet TAKE 1/2 TABLET  IN THE EVENING WITH OR IMMEDIATELY FOLLOWING A MEAL 14 tablet 0  . montelukast (SINGULAIR) 10 MG tablet TAKE ONE TABLET AT BEDTIME. 30 tablet 5  . Multiple Vitamins-Minerals (CENTRUM SILVER PO) Take 1 tablet by mouth daily.    . Multiple Vitamins-Minerals (ZINC PO) Take 1 tablet by mouth daily.    [name] omeprazole (PRILOSEC) 20 MG capsule TAKE (1) CAPSULE TWICE DAILY. 60 capsule 3  . PEG-KCl-NaCl-NaSulf-Na Asc-C (PLENVU) 140 g SOLR Take 1 kit by mouth as directed. Pt has a manufacturers coupon to bring to  Pharmacy 1 each 0  . Polyethyl Glycol-Propyl Glycol 0.4-0.3 % SOLN Place 1 drop into both eyes daily as needed (for dry eyes).     . potassium chloride SA (KLOR-CON) 20 MEQ tablet TAKE 1 TABLET ONCE DAILY. 28 tablet 0  . rosuvastatin (CRESTOR) 40 MG tablet TAKE 1 TABLET ONCE DAILY. 28 tablet 0  . senna (SENOKOT) 8.6 MG tablet Take 1 tablet by mouth 2 (two) times daily.    [name] omeprazole (PRILOSEC) 40 MG capsule Take 1 capsule (40 mg total) by mouth in the morning and at bedtime. 40 mg twice daily for 30 days and then drop down to 20 mg twice daily (Patient not taking: Reported on 10/18/2020) 90 capsule 3   No current facility-administered medications for this visit.    Allergies Allergies  Allergen Reactions  . Niacin-Lovastatin Er Shortness Of Breath and Other (See Comments)    Dyspnea, flushing  . Penicillins Hives, Shortness Of Breath and Other (See Comments)    Flushing  & dyspnea Because of a history of documented adverse serious drug reaction;Medi Alert bracelet  is recommended PCN reaction causing immediate rash, facial/tongue/throat swelling, SOB or lightheadedness with hypotension: Yes PCN reaction causing severe rash involving mucus membranes or skin necrosis: No PCN reaction occurring within the last 10 years: NO PCN reaction that required hospitalization: NO  . Sulfonamide Derivatives Hives, Shortness Of Breath and Other (See Comments)    Flushing & dyspnea Because of a history of  documented adverse serious drug reaction;Medi Alert bracelet  is recommended  . Atorvastatin Other (See Comments)    Myalgias & athralgias- takes Crestor, DOES NOT LIKE LIPITOR    Histories Past Medical History:  Diagnosis Date  . AAA (abdominal aortic aneurysm) ([place]) LAST ABDOMINAL US 10-20-17 3.3 CM   MONITORED BY DR [name]  . Allergy   . Anxiety   . Arthritis   . Atrial fibrillation ([place])   . Basal cell carcinoma   . Bilateral carotid artery stenosis DUPLEX 12-29-2012  BY DR [facility]   BILATERAL ICA STENOSIS 60-79%  . BPH (benign prostatic hypertrophy)   . Cataract    removed both eyes  . CHF (congestive heart failure) ([name])   . Chronic diastolic heart failure ([place])   . Chronic kidney disease    stage 3, pt unaware  . Colon cancer ([place])   . Complication of anesthesia    had nausea after carotid artery surgery, states the medicine  given to help the nausea, made it worse  . Constipation   . COPD (chronic obstructive pulmonary disease) ([place])   . Depression   . Emphysema of lung ([place])   . GERD (gastroesophageal reflux disease)   . Glaucoma BOTH EYES   Dr Gershon Crane  . History of basal cell carcinoma excision   . History of lung cancer APRIL 2012  SQUAMOUS CELL---- S/P RIGHT LOWER LOBECTOMY AT [facility] --  NO CHEMORADIATION---  NO RECURRENCE    ONCOLOGIST- DR Tressie Stalker  LOV IN [facility] 10-27-2012  . History of pneumothorax    pt unaware  . Hx of adenomatous colonic polyps 2005    X 2; 1 hyperplastic polyp; Dr Olevia Perches  . Hyperlipidemia   . Hypertension   . Impaired fasting glucose 2007   108; A1c5.4%  . Lesion of bladder   . Lung cancer ([place]) 01/25/2012  . Microhematuria   . OSA (obstructive sleep apnea) 08/29/2015   CPAP SET ON 10  . PAD (peripheral artery disease) ([place]) ABI'S  JAN 2014  0.65 ON RIGHT ;  1.04 ON LEFT  . Peripheral vascular disease ([place]) S/P ANGIOPLASTY AND STENTING   FOLLOWED  BY DR [name]  . Pleural effusion on right 11/18/2018   Chronic, noted on CXR  .  Sleep apnea    + cpap   . Spinal stenosis Sept. 2015  . Status post placement of implantable loop recorder   . Stroke [facility]) Jul 07, 2013   mini  TIA  . Thoracic aorta atherosclerosis ([place])   . Thyrotoxicosis    amiodarone induced  . Urgency of urination    Past Surgical History:  Procedure Laterality Date  . ANGIO PLASTY     X 4 in legs  . AORTOGRAM  07-27-2002   MILD DIFFUSE ILIAC ARTERY OCCLUSIVE DISEASE /  LEFT RENAL ARTERY 20%/ PATENT LEFT FEM-POP GRAFT/ MILD SFA AND POPLITEAL ARTERY OCCLUSIVE DISEASE W/ SEVERE KIDNEY OCCLUSIVE DISEASE  . ATRIAL FIBRILLATION ABLATION N/A 04/09/2019   Procedure: ATRIAL FIBRILLATION ABLATION;  Surgeon: Thompson Grayer, MD;  Location: [place] CV LAB;  Service: Cardiovascular;  Laterality: N/A;  . ATRIAL FIBRILLATION ABLATION N/A 02/12/2020   Procedure: ATRIAL FIBRILLATION ABLATION;  Surgeon: Thompson Grayer, MD;  Location: [place] CV LAB;  Service: Cardiovascular;  Laterality: N/A;  . BASAL CELL CARCINOMA EXCISION     MULTIPLE TIMES--  RIGHT FOREARM, CHEEKS, AND BACK  . BIOPSY  06/25/2018   Procedure: BIOPSY;  Surgeon: Rush Landmark Telford Nab., MD;  Location: [place];  Service: Gastroenterology;;  . CARDIOVASCULAR STRESS TEST  12-08-2012  DR MCLEAN   NORMAL LEXISCAN WITH NO EXERCISE NUCLEAR STUDY/ EF 66%/   NO ISCHEMIA/ NO SIGNIFICANT CHANGE FROM PRIOR STUDY  . CARDIOVERSION N/A 02/14/2018   Procedure: CARDIOVERSION;  Surgeon: Larey Dresser, MD;  Location: [facility] ENDOSCOPY;  Service: Cardiovascular;  Laterality: N/A;  . CAROTID ANGIOGRAM N/A 07/10/2013   Procedure: CAROTID ANGIOGRAM;  Surgeon: Elam Dutch, MD;  Location: [name] CATH LAB;  Service: Cardiovascular;  Laterality: N/A;  . CAROTID ENDARTERECTOMY Right 07-14-13   cea  . CATARACT EXTRACTION W/ INTRAOCULAR LENS  IMPLANT, BILATERAL    . colon polyectomy    . COLONOSCOPY    . COLONOSCOPY WITH PROPOFOL N/A 06/25/2018   Procedure: COLONOSCOPY WITH PROPOFOL;  Surgeon: Rush Landmark Telford Nab., MD;  Location: [place];  Service: Gastroenterology;  Laterality: N/A;  . CYSTOSCOPY W/ RETROGRADES Bilateral 01/21/2013   Procedure: CYSTOSCOPY WITH RETROGRADE PYELOGRAM;  Surgeon: Molli Hazard, MD;  Location: [place];  Service: Urology;  Laterality: Bilateral;   CYSTO, BLADDER BIOPSY, BILATERAL RETROGRADE PYELOGRAM  RAD TECH FROM RADIOLOGY PER JOY  . CYSTOSCOPY WITH BIOPSY N/A 01/21/2013   Procedure: CYSTOSCOPY WITH BIOPSY;  Surgeon: Molli Hazard, MD;  [facility];  Service: Urology;  Laterality: N/A;  . ENDARTERECTOMY Right 07/14/2013   Procedure: ENDARTERECTOMY CAROTID;  Surgeon: Angelia Mould, MD;  Location: [place];  Service: Vascular;  Laterality: Right;  . EP IMPLANTABLE DEVICE N/A 08/12/2015   Procedure: Loop Recorder Insertion;  Surgeon: Thompson Grayer, MD;  Location: [place] CV LAB;  Service: Cardiovascular;  Laterality: N/A;  . ESOPHAGOGASTRODUODENOSCOPY (EGD) WITH PROPOFOL N/A 06/25/2018   Procedure: ESOPHAGOGASTRODUODENOSCOPY (EGD) WITH PROPOFOL;  Surgeon: Rush Landmark Telford Nab., MD;  Location: [place];  Service: Gastroenterology;  Laterality: N/A;  . ESOPHAGOGASTRODUODENOSCOPY (EGD) WITH PROPOFOL N/A 07/10/2018   Procedure: ESOPHAGOGASTRODUODENOSCOPY (EGD) WITH PROPOFOL;  Surgeon: Milus Banister, MD;  Location: [facility] ENDOSCOPY;  Service: Endoscopy;  Laterality: N/A;  . EUS N/A 07/10/2018   Procedure: UPPER ENDOSCOPIC ULTRASOUND (EUS) RADIAL;  Surgeon: Milus Banister, MD;  Location: [facility] ENDOSCOPY;  Service: Endoscopy;  Laterality: N/A;  . EYE SURGERY Right   . FEMORAL-POPLITEAL BYPASS GRAFT Left 1994  [facility]   AND 2001  IN [place]  . FEMORAL-POPLITEAL BYPASS GRAFT Left 08/30/2015   Procedure: REVISION OF BYPASS GRAFT Left  FEMORAL-POPLITEAL ARTERY;  Surgeon: Angelia Mould, MD;  Location: [place];  Service: Vascular;  Laterality: Left;  . FINE NEEDLE ASPIRATION N/A 07/10/2018   Procedure:  FINE NEEDLE ASPIRATION (FNA) LINEAR;  Surgeon: Milus Banister, MD;  Location: [facility] ENDOSCOPY;  Service: Endoscopy;  Laterality: N/A;  . FLEXIBLE SIGMOIDOSCOPY N/A 12/12/2018   Procedure: FLEXIBLE SIGMOIDOSCOPY;  Surgeon: Ileana Roup, MD;  Location: [facility] ORS;  Service: General;  Laterality: N/A;  . LARYNGOSCOPY  06-27-2004   BX VOCAL CORD  (LEUKOPLAKIA)  PER PT NO ISSUES SINCE  . LEFT HEART CATH AND CORONARY ANGIOGRAPHY N/A 08/20/2017   Procedure: LEFT HEART CATH AND CORONARY ANGIOGRAPHY;  Surgeon: Larey Dresser, MD;  Location: [place] CV LAB;  Service: Cardiovascular;  Laterality: N/A;  . LOOP RECORDER INSERTION N/A 04/09/2019   Procedure: LOOP RECORDER INSERTION;  Surgeon: Thompson Grayer, MD;  Location: [place] CV LAB;  Service: Cardiovascular;  Laterality: N/A;  . LOOP RECORDER REMOVAL N/A 04/09/2019   Procedure: LOOP RECORDER REMOVAL;  Surgeon: Thompson Grayer, MD;  Location: [place] CV LAB;  Service: Cardiovascular;  Laterality: N/A;  . LOWER EXTREMITY ANGIOGRAM Bilateral 08/29/2015   Procedure: Lower Extremity Angiogram;  Surgeon: Angelia Mould, MD;  Location: [place] CV LAB;  Service: Cardiovascular;  Laterality: Bilateral;  . LUNG LOBECTOMY  01/24/2011    RIGHT UPPER LOBE  (SQUAMOUS CELL CARCINOMA) Dr Dorthea Cove , [facility]. No chemotherapyor radiation  . PATCH ANGIOPLASTY Right 07/14/2013   Procedure: PATCH ANGIOPLASTY;  Surgeon: Angelia Mould, MD;  Location: [place];  Service: Vascular;  Laterality: Right;  . PATCH ANGIOPLASTY Left 08/30/2015   Procedure: VEIN PATCH ANGIOPLASTY OF PROXIMAL Left BYPASS GRAFT;  Surgeon: Angelia Mould, MD;  Location: [place];  Service: Vascular;  Laterality: Left;  . PERIPHERAL VASCULAR CATHETERIZATION N/A 08/29/2015   Procedure: Abdominal Aortogram;  Surgeon: Angelia Mould, MD;  Location: [place] CV LAB;  Service: Cardiovascular;  Laterality: N/A;  . POLYPECTOMY  06/25/2018   Procedure: POLYPECTOMY;  Surgeon:  Rush Landmark Telford Nab., MD;  Location: [place];  Service: Gastroenterology;;  . POLYPECTOMY    . SUBMUCOSAL INJECTION  06/25/2018   Procedure: SUBMUCOSAL INJECTION;  Surgeon: Rush Landmark Telford Nab., MD;  Location: [place];  Service: Gastroenterology;;  . TEE WITHOUT CARDIOVERSION N/A 02/14/2018   Procedure: TRANSESOPHAGEAL ECHOCARDIOGRAM (TEE);  Surgeon: Larey Dresser, MD;  Location: [facility] ENDOSCOPY;  Service: Cardiovascular;  Laterality: N/A;  . trabecular surgery     OS  . TRANSTHORACIC ECHOCARDIOGRAM  12-29-2012  DR [facility]   MILD LVH/  LVSF NORMAL/ EF 76-72%/  GRADE I DIASTOLIC DYSFUNCTION   Social History   Socioeconomic History  . Marital status: Married    Spouse name: [name]   . Number of children: 3  . Years of education: 12+  . Highest education level: Not on file  Occupational History  . 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 Smoking status: Former Smoker    Packs/day: 0.50    Years: 50.00    Pack years: 25.00    Types: Cigarettes    Quit date: 07/28/2014    Years since quitting: 6.2  . Smokeless tobacco: Never Used  Vaping Use  . Vaping Use: Never used  Substance and Sexual Activity  . Alcohol use: Yes    Alcohol/week: 4.0 standard drinks    Types: 1 Glasses of wine, 1 Cans of beer, 1 Shots of liquor, 1 Standard drinks or equivalent per week    Comment:  socially, variable  . Drug use: No  . Sexual activity: Not on file  Other Topics Concern  . Not on file  Social History Narrative   Patient lives at home with spouse [name]   Patient has 3 children    Patient is right handed    Social Determinants of Health   Financial Resource Strain: Low Risk   . Difficulty of Paying Living Expenses: Not hard at all  Food Insecurity: No Food Insecurity  . Worried About Charity fundraiser in the Last Year: Never true  . Ran Out of  Food in the Last Year: Never true  Transportation Needs: No Transportation Needs  . Lack of Transportation (Medical): No  . Lack of Transportation (Non-Medical): No  Physical Activity: Insufficiently Active  . Days of Exercise per Week: 3 days  . Minutes of Exercise per Session: 30 min  Stress: No Stress Concern Present  . Feeling of Stress : Not at all  Social Connections: Not on file  Intimate Partner Violence: Not on file   Family History  Problem Relation Age of Onset  . Stroke Mother        mini strokes  . Alcohol abuse Father   . Heart disease Father        MI after 67  . Stroke Father   . Hypertension Father   . Heart attack Father   . Heart disease Paternal Aunt        several  . Hypertension Paternal Aunt        several  . Stroke Paternal Aunt        several  . Stroke Paternal Uncle        several  . Heart disease Paternal Uncle        several;2 had MI pre 88  . Cancer Daughter 13       breast ca, also with benign sessile polyp   . Colon cancer Daughter  51  . Colon polyps Neg Hx   . Esophageal cancer Neg Hx   . Rectal cancer Neg Hx   . Stomach cancer Neg Hx    I have reviewed his medical, social, and family history in detail and updated the electronic medical record as necessary.    PHYSICAL EXAMINATION  BP (!) 142/80   Pulse 68   Ht 6' 1.5" (1.867 m)   Wt 204 lb (92.5 kg)   SpO2 97%   BMI 26.55 kg/m  Wt Readings from Last 3 Encounters:  10/18/20 204 lb (92.5 kg)  08/17/20 192 lb (87.1 kg)  08/09/20 194 lb 6.4 oz (88.2 kg)  GEN: NAD, appears stated age, doesn't appear chronically ill PSYCH: Cooperative, without pressured speech EYE: Conjunctivae pink, sclerae anicteric ENT: Masked CV: Nontachycardic RESP: No appreciable wheezing on exam GI: NABS, soft, surgical scars present, nontender, nondistended, no rebound MSK/EXT: Trace bilateral lower extremity edema SKIN: No jaundice NEURO:  Alert & Oriented x 3, no focal deficits   REVIEW OF DATA   I reviewed the following data at the time of this encounter:  GI Procedures and Studies  September 2021 colonoscopy - Hemorrhoids found on digital rectal exam. - The examined portion of the ileum was normal. - Seven 2 to 7 mm polyps in the transverse colon, in the ascending colon and in the cecum, removed with a cold snare. Resected and retrieved. - Diverticulosis in the recto-sigmoid colon and in the sigmoid colon. - Patent end-to-end colo-colonic anastomosis (at 20 cm), characterized by visible sutures. Sutures removed. - Normal mucosa in the entire examined colon otherwise. - Non-bleeding non-thrombosed external and internal hemorrhoids.  September 2021 EGD - No gross lesions in esophagus. Z-line regular, 40 cm from the incisors. - Multiple gastric polyps. 1 polyp with evidence of active oozing, resected via EMR and retrieved. Clips (MR conditional) were placed. - Erythematous mucosa in the antrum. No other gross lesions in the stomach. Gastric Mapping biopsies performed. - No gross lesions in the duodenal bulb, in the first portion of the duodenum and in the second portion of the duodenum.  Pathology Diagnosis 1. Surgical [P], gastric antrum - CHRONIC GASTRITIS. REACTIVE GASTROPATHY. - INTESTINAL METAPLASIA IS PRESENT FOCALLY, NEGATIVE FOR DYSPLASIA. - WARTHIN-STARRY STAIN IS NEGATIVE FOR HELICOBACTER PYLORI. 2. Surgical [P], gastric incisura - CHRONIC GASTRITIS. REACTIVE GASTROPATHY. - NEGATIVE FOR INTESTINAL METAPLASIA. 3. Surgical [P], greater curve of stomach - MILD CHRONIC GASTRITIS. - NEGATIVE FOR INTESTINAL METAPLASIA. 4. Surgical [P], stomach, cardia and fundus - MILD CHRONIC GASTRITIS. - NEGATIVE FOR INTESTINAL METAPLASIA. 5. Surgical [P], lesser curve of stomach - MILD CHRONIC GASTRITIS. - NEGATIVE FOR INTESTINAL METAPLASIA. 6. Surgical [P], gastric body polyp emr - HYPERPLASTIC POLYP, ULCERATED AND INFLAMED WITH GRANULATION TISSUE AND REACTIVE CHANGES. -  NEGATIVE FOR INTESTINAL METAPLASIA. 7. Surgical [P], colon, ascending, cecum, transverse, polyp (6) - TUBULAR ADENOMA (X MULTIPLE). - NEGATIVE FOR HIGH GRADE DYSPLASIA.  Laboratory Studies  Reviewed those in epic  Imaging Studies  No new imaging studies to review   ASSESSMENT  Troy [name] is a 80 y.o. male with a pmh significant for Colon cancer status post LAR, colon polyps, diverticulosis, cholelithiasis, gastritis with intestinal metaplasia, chronic renal insufficiency, carotid artery stenosis, history of prior BCC, A. fib (on anticoagulation), BPH, prior lung cancer, sleep apnea, peripheral arterial disease, hypertension, hyperlipidemia, iron deficiency.  The patient is seen today for evaluation and management of:  1. Personal history of colon cancer   2. Hx of adenomatous colonic polyps  3. Gastric intestinal metaplasia   4. History of gastric polyps   5. Iron deficiency   6. History of anemia   7. Chronic anticoagulation    The patient is hemodynamically and clinically stable at this time.  He is now up-to-date from a colon cancer screening/colon polyp surveillance and his next colonoscopy will be due in 2024.  He did have evidence of some iron deficiency which led to an upper endoscopy being performed as well.  He was found to have his known intestinal metaplasia which was gastric mapped.  If we were only going off gastric mapping he would be due for a follow-up in 3 years (2024).  However, he had inflammatory appearing polyps with one that looked like there was recent oozing.  That one was removed and returned as a benign hyperplastic/inflammatory polyp with no dysplasia.  He has not had repeat blood counts or iron indices to check to see if that has improved his iron deficiency.  My goal hopefully is that having that removed will allow [name] to not need to pursue any further endoscopic evaluation at least until the end of this year (1 year follow-up).  If however the patient has evidence  of iron deficiency we would consider an earlier repeat endoscopy to remove the rest of the gastric polyps that were inflammatory appearing.  We would do that in the hospital-based setting in an effort of being able to use clips to decrease risk of bleeding post procedurally.  If he had persistence of iron deficiency then we would consider the role of video capsule endoscopy at some point (understanding as a result of his prior surgical interventions he may be at higher risk of potential capsule retention though unlikely overall).  We will see how his blood counts and iron indices look.  If any repeat endoscopic evaluation is needed earlier than the 1 year mark, we will get approval from [name] for holding of his anticoagulation.  He is going to be following up with cardiology in the next month and a half for normal follow-up to ensure no other issues are occurring.  All patient questions were answered to the best of my ability, and the patient agrees to the aforementioned plan of action with follow-up as indicated.   PLAN  CBC and iron indices to be obtained -If evidence of iron deficiency is present then would consider repeat EGD/SBE in hospital-based setting for resection of all remaining gastric polyps --Only then if persistent iron deficiency was noted would consider video capsule endoscopy -If no evidence of iron deficiency is present then would only repeat upper endoscopy in the late portion of 2022 for 1 year follow-up of recent resection Colonoscopy due in 2024 (based on patient's medical comorbidities at the time) We will keep in mind that the patient does have cholelithiasis if he has recurrent issues or biliary type colic then we need to consider cholecystectomy at some point Anticoagulation hold as per cardiology of at least 1 to 2 days prior to any procedures  We will decrease omeprazole to 20 mg daily and he will let us know if any symptoms occur by dropping his dose   Orders Placed  This Encounter  Procedures  . CBC  . IBC + Ferritin    New Prescriptions   No medications on file   Modified Medications   No medications on file    Planned Follow Up No follow-ups on file.   Total Time in Face-to-Face and in Coordination of Care  for patient including independent/personal interpretation/review of prior testing, medical history, examination, medication adjustment, communicating results with the patient directly, and documentation with the EHR is 25 minutes.   Justice Britain, MD [facility] Advanced [facility]oscopy Office # [phone]

## 2020-10-18 NOTE — Patient Instructions (Signed)
Your provider has requested that you go to the basement level for lab work before leaving today. Press "B" on the elevator. The lab is located at the first door on the left as you exit the elevator.  Make sure your taking Omeprazole 20mg  - once daily 30 minutes before breakfast.   Depending on results of your labs, Dr. Rush Landmark will decide on when you should have your next endoscopy.   If you are age 81 or older, your body mass index should be between 23-30. Your Body mass index is 26.55 kg/m. If this is out of the aforementioned range listed, please consider follow up with your Primary Care Provider.  If you are age 57 or younger, your body mass index should be between 19-25. Your Body mass index is 26.55 kg/m. If this is out of the aformentioned range listed, please consider follow up with your Primary Care Provider.    Thank you for choosing me and [facility].  Dr. Rush Landmark

## 2020-10-20 DIAGNOSIS — D692 Other nonthrombocytopenic purpura: Secondary | ICD-10-CM | POA: Diagnosis not present

## 2020-10-20 DIAGNOSIS — D225 Melanocytic nevi of trunk: Secondary | ICD-10-CM | POA: Diagnosis not present

## 2020-10-20 DIAGNOSIS — L570 Actinic keratosis: Secondary | ICD-10-CM | POA: Diagnosis not present

## 2020-10-20 DIAGNOSIS — D2261 Melanocytic nevi of right upper limb, including shoulder: Secondary | ICD-10-CM | POA: Diagnosis not present

## 2020-10-20 DIAGNOSIS — D2262 Melanocytic nevi of left upper limb, including shoulder: Secondary | ICD-10-CM | POA: Diagnosis not present

## 2020-10-20 DIAGNOSIS — L821 Other seborrheic keratosis: Secondary | ICD-10-CM | POA: Diagnosis not present

## 2020-10-20 DIAGNOSIS — Z85828 Personal history of other malignant neoplasm of skin: Secondary | ICD-10-CM | POA: Diagnosis not present

## 2020-10-24 NOTE — Progress Notes (Signed)
Carelink Summary Report / Loop Recorder 

## 2020-10-28 ENCOUNTER — Other Ambulatory Visit: Payer: Self-pay | Admitting: Internal Medicine

## 2020-10-28 ENCOUNTER — Other Ambulatory Visit (HOSPITAL_COMMUNITY): Payer: Self-pay | Admitting: Cardiology

## 2020-10-28 DIAGNOSIS — I509 Heart failure, unspecified: Secondary | ICD-10-CM

## 2020-10-28 DIAGNOSIS — R0602 Shortness of breath: Secondary | ICD-10-CM

## 2020-11-09 NOTE — Progress Notes (Signed)
Patient ID: Troy Prater [name], male   DOB: 07-Sep-1941, 80 y.o.   MRN: [id number] PCP: Dr. Quay Burow Cardiology: [name]  80 y.o. with history of HTN, COPD, active smoking/COPD, carotid stenosis s/p right CEA, paroxysmal atrial fibrillation, and PAD.   He does not have known obstructive CAD but is at high risk for CAD based on his comorbidities.  Lexiscan Cardiolite in 3/14 showed no ischemia or infarction and echo in 3/14 showed normal EF.  He had a left fem-pop bypass at the [facility] in the '90s.  He is followed at [facility] for PAD. He has chronic right calf/thigh/buttocks claudication that is unchanged over the last few years and follows regularly at [facility].  He has had right lower lobectomy for lung cancer.  He continues to stay off cigarettes.    At a prior appointment, he was in atrial fibrillation but did not realize it. Dr Aundra Dubin  started him on Eliquis and diltiazem CD, and he spontaneously converted to NSR.  In 5/15, he was at [facility] and felt "strange" one day: fatigued, weak, short of breath.  He went to the ER and was in atrial fibrillation with HR in 80s-90s.  He spontaneously converted to NSR in the ER.  He felt back to normal after converting to NSR.  Started on Multaq 400 mg bid.  With CHF, he was eventually transitioned over to amiodarone.   He had patch angioplasty revision of left fem-pop bypass in 11/16.  Now with minimal claudication.  He follows with [facility].     He had a Cardiolite and echo in 7/17 that were unremarkable.  Repeat Cardiolite in 12/17 showed no ischemia, echo was uninterpretable.  Cardiac MRI was therefore done in 1/18, showing EF 66% with normal-appearing RV, no late gadolinium enhancement.   Given increased exertional dyspnea and a defect on Cardiolite, he had LHC in 11/18.  This showed no obstructive CAD. He had a chest CT done given concern for possible amiodarone lung toxicity with increased dyspnea. This showed emphysema, no ILD.    In 5/19, he developed a  probable viral syndrome with dehydration and had a presyncopal episode.  He was orthostatic in the hospital.  He was noted to be in atrial fibrillation this admission which remained persistent.  He had a TEE-guided DCCV back to NSR.   In 9/19, he was admitted with GI bleeding and found to have a sigmoid mass on colonoscopy, path showed sigmoid adenocarcinoma.  He was noted to have right paratracheal lymphadenopathy but EUS with biopsy in 10/19 did not show malignancy.  He had surgical resection of colon cancer.   Amiodarone was stopped with elevated ESR and increased dyspnea.  He was also found to have hyperthyroidism. He was treated with methimazole by endocrinology.    He had atrial fibrillation ablation in 7/20.   Echo in 4/21 showed EF 60-65%, normal RV.    In 5/21, due to high atrial fibrillation burden, he had redo atrial fibrillation ablation.   Cardiolite in 6/21 showed EF 70%, no ischemia/infarction.   Today he returns for HF follow up. Says he feels like he is full of fluid. Over the last few months he has noticed increased shortness of breath with exertion. SOB with steps. Denies /Orthopnea. Appetite ok. Had 4 pieces of pizza last night. Snacking on fritos/olives/cheese/crackers.   No fever or chills. Weight at home  Has gone up 8-10 pounds. Taking all medications. He has caregivers 5 days a week. Visually limited due to  acute glaucoma.   ECG (personally reviewed): Sinus Brady 56 bpm personally reviewed.   Labs (3/13): LDL 75, HDL 35, K 4.1, creatinine 1.1 Labs (3/14): LDL 91, HDL 27 Labs (10/14): K 4.3, creatinine 0.98 Labs (4/15): K 4.9, creatinine 1.1, LDL 76, HDL 30 Labs (5/15): K 4.3, creatinine 1.7, BNP 251 Labs (6/15): K 4.4, creatinine 1.3 Labs (10/15): TSH normal Labs (5/16): K 4.2, creatinine 1.12, HCT 39.5, LFTs normal, LDL 84, HDL 43, TSH normal Labs (11/16): creatinine 0.94 Labs (4/17): LDL 39, HDL 39 Labs (6/17): K 4.5, creatinine 1.2 Labs (9/17): K 4, creatinine  1.25, HCT 38.7 Labs (12/17): K 4.5, creatinine 1.49 => 1.62, BNP 43 Labs (2/18): K 3.8, creatinine 1.33, LFTs normal, TSH normal Labs (6/18): K 4.3, creatinine 1.49, LFTs normal, TSH normal, hgb 12.3 Labs (11/18): ESR 60, TSH normal, LFTs normal, creatinine 1.34 Labs (1/19): LDL 50 Labs (5/19): K 4.6, creatinine 1.55, LFTs normal, hgb 12.7, LFTs normal Labs (10/19): K 4 => 4.1, creatinine 1.39 => 1.43, BNP 279, hgb 10.7, TSH low, free T4 and free T3 low, ESR 108 Labs (3/21): K 4.5, creatinine 1.19, TSH normal Labs (4/21): K 4.8, creatinine 1.28, BNP 524, LDL 59 Labs (7/21): hgb 11.5, creatinine 1.4 Labs (8/21): K 4.3, creatinine 1.09 Labs (9/21): hgb 12.7  PMH: 1. HTN: Fatigue and cough with ramipril use.  2. COPD: Quit smoking 2014. PFTs (1/18) with moderately severe COPD.  - CT chest (11/18): Emphysema noted, no ILD.  3. AAA: CT 1/13 with 3.0 cm AAA.  Abdominal US (1/14) with 3.4 cm AAA. Abdominal US (4/15) with 3.25 x 3.27 AAA. Abdominal US (3/16) with 3.7 cm AAA.  Followed at [facility].  - Abd [name] (1/19): 3.3 cm AAA. 4. Squamous cell lung cancer diagnosed 2/12.  Had right lower lobectomy in 4/12.  5. Hyperlipidemia 6. PAD: Left fem-pop bypass 1994.  ABIs (2/12) 0.62 on right, 0.95 on left. ABIs (1/14): 0.65 on right, 1.04 on left. ABIs (4/15) 0.66 right, 0.87 left.  ABIs (3/16) 0.6 right, 0.88 left.  Patch angioplasty left fem-pop bypass in 11/16.   - Left fem-pop bypass patent on doppler evaluation in 12/17.  - ABIs (2/21): normal on left (1.02), moderately decreased on right (0.54).  7. CAD: Stress myoview 2004 was normal. Lexiscan Cardiolite (3/14) with EF 66%, no ischemia or infarction. Lexiscan Cardiolite (7/17) with EF 61%, no ischemia/infarction.  - Cardiolite (12/17) with EF 62%, inferior/inferolateral fixed defect, most likely diaphragmatic attenuation, no ischemia.  - Cardiolite (9/18) with EF > 65%, fixed inferior defect (attenuation versus infarction), no ischemia.  - LHC  (11/18): No obstructive CAD.  - Cardiolite (6/21): EF 70%, no ischemia/infarction 8. Chronic diastolic CHF: Echo (2/37): technically difficult with EF 55-60%, upper normal RV size.  Echo (5/16) with EF 60-65%.  Echo (7/17) with EF 55-60%, normal RV size and systolic function.  - Echo 12/17 with very poor windows, unable to comment on LV or RV function.  - Cardiac MRI (1/18) with EF 66%, normal RV size and systolic function.  - TEE (5/19): EF 55-60%, normal RV size and systolic function.  - Echo (4/21): EF 60-65%, normal RV 9. Carotid stenosis: TIA 10/14.  Carotid dopplers with > 62% RICA, 83-15% LICA.  Patient had right CEA in 10/14. Carotids (4/15) patent right CEA, LICA 17-61% stenosis.  Carotids (60/73) < 71% LICA, patent right CEA.  - Carotid dopplers (12/17): right CEA ok, < 06% LICA stenosis.  - Carotid dopplers (1/19): Right CEA ok, 2-69% LICA  stenosis.  - Carotid dopplers (2/21): Right CEA ok, LICA 76-72% stenosis.  10. Atrial fibrillation: Paroxysmal. First noted after lobectomy in 4/12 (brief), recurrence in 4/15 then in 5/15.  - TEE-guided DCCV for persistent atrial fibrillation in 5/19.  - Atrial fibrillation ablation 7/20.  - Re-do atrial fibrillation ablation 5/21.  11. Spinal stenosis 12. OSA: Using CPAP.  13. Glaucoma 14. Has ILR 15. Colon cancer: Sigmoid adenocarcinoma, s/p surgical resection.  16. 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   SH: Married, lives in [place].  Former [place] [employer].  Quit smoking in 2014.  Rare ETOH now.   FH: No premature CAD  ROS: All systems reviewed and negative except as per HPI.   Current Outpatient Medications  Medication Sig Dispense Refill  . amLODipine (NORVASC) 5 MG tablet TAKE 1 TABLET ONCE DAILY. 28 tablet 0  . Ascorbic Acid (VITAMIN C PO) Take 500 mg by mouth in the morning and at bedtime.     . Biotin 2500 MCG CAPS Take 2,500 mcg by mouth daily.    . Brinzolamide-Brimonidine (SIMBRINZA) 1-0.2 % SUSP  Place 1 drop into both eyes 2 times daily.    . budesonide-formoterol (SYMBICORT) 160-4.5 MCG/ACT inhaler Inhale 2 puffs into the lungs 2 (two) times daily.    [name] docusate sodium (COLACE) 100 MG capsule Take 100 mg by mouth 2 (two) times daily.     [name] ELIQUIS 5 MG TABS tablet TAKE 1 TABLET BY MOUTH TWICE DAILY. 56 tablet 0  . escitalopram (LEXAPRO) 10 MG tablet TAKE 1 TABLET BY MOUTH DAILY. 28 tablet 5  . ferrous sulfate 325 (65 FE) MG tablet Take 325 mg by mouth 2 (two) times daily with a meal.     . furosemide (LASIX) 40 MG tablet Take 1 tablet (40 mg total) by mouth 2 (two) times daily. (Patient taking differently: Take 20 mg by mouth 2 (two) times daily.) 60 tablet 3  . latanoprost (XALATAN) 0.005 % ophthalmic solution     . losartan (COZAAR) 25 MG tablet TAKE (2) TABLETS BY MOUTH DAILY. 56 tablet 5  . Magnesium 500 MG CAPS Take 500 mg by mouth daily.     . Melatonin 5 MG TABS Take 5 mg by mouth at bedtime.    . metoprolol succinate (TOPROL-XL) 25 MG 24 hr tablet TAKE 1/2 TABLET IN THE EVENING WITH OR IMMEDIATELY FOLLOWING A MEAL 14 tablet 0  . montelukast (SINGULAIR) 10 MG tablet TAKE ONE TABLET AT BEDTIME. 28 tablet 5  . Multiple Vitamins-Minerals (CENTRUM SILVER PO) Take 1 tablet by mouth daily.    . Multiple Vitamins-Minerals (ZINC PO) Take 1 tablet by mouth daily.    [name] omeprazole (PRILOSEC) 20 MG capsule TAKE (1) CAPSULE TWICE DAILY. 60 capsule 3  . PEG-KCl-NaCl-NaSulf-Na Asc-C (PLENVU) 140 g SOLR Take 1 kit by mouth as directed. Pt has a manufacturers coupon to bring to  Pharmacy 1 each 0  . Polyethyl Glycol-Propyl Glycol 0.4-0.3 % SOLN Place 1 drop into both eyes daily as needed (for dry eyes).     . potassium chloride SA (KLOR-CON) 20 MEQ tablet TAKE 1 TABLET ONCE DAILY. 28 tablet 0  . rosuvastatin (CRESTOR) 40 MG tablet TAKE 1 TABLET ONCE DAILY. 28 tablet 0  . senna (SENOKOT) 8.6 MG tablet Take 1 tablet by mouth 2 (two) times daily.    [name] omeprazole (PRILOSEC) 40 MG capsule Take 1  capsule (40 mg total) by mouth in the morning and at bedtime. 40 mg twice daily for  30 days and then drop down to 20 mg twice daily (Patient not taking: Reported on 11/10/2020) 90 capsule 3   No current facility-administered medications for this encounter.   Wt Readings from Last 3 Encounters:  11/10/20 93.1 kg (205 lb 3.2 oz)  10/18/20 92.5 kg (204 lb)  08/17/20 87.1 kg (192 lb)    BP 112/64   Pulse (!) 56   Wt 93.1 kg (205 lb 3.2 oz)   SpO2 94%   BMI 26.71 kg/m   Reds Clip 37%.  General:  Well appearing. No resp difficulty HEENT: normal Neck: supple. JVP 9-10. Carotids 2+ bilat; no bruits. No lymphadenopathy or thryomegaly appreciated. Cor: PMI nondisplaced. Regular rate & rhythm. No rubs, gallops or murmurs. Lungs: clear except decreased in RLL.  Abdomen: soft, nontender, nondistended. No hepatosplenomegaly. No bruits or masses. Good bowel sounds. Extremities: no cyanosis, clubbing, rash, R and LLE trace -1+edema Neuro: alert & orientedx3, cranial nerves grossly intact. moves all 4 extremities w/o difficulty. Affect pleasant  EKG: SR 56 bpm   Assessment/Plan: 1. Chronic diastolic CHF: Echo in 4/31 with EF 60-65%, normal RV.   NYHA III. Reds Clip 37%  Volume status elevated suspect in the setting of high sodium foods.  I offered 3 options 1- one time dose of IV lasix in the clinic 2- Screen for AT HOME Study 3- Increase home lasix regimen. - He would like to try increasing home lasix regimen. We are going to try lasix 60 mg twice a day 2/10, 2/11, and 2/12 with extra 20 meq of potassium  - On 2/13 he cut back lasix to 20 mg twice a day + 20 meq Potassium  - We discussed low salt food choices.   2. Hyperlipidemia: Continue Crestor.   3. Carotid stenosis: s/p R CEA.  Dopplers followed at [facility].  4. PAD: s/p patch angioplasty to left fem-pop bypass in 11/16.  Followed at [facility].  Moderately decreased ABI on right in 2/21, but no pain or pedal ulcerations.  Stable mild claudication.   5. AAA: Stable on last [name], followed at [facility].    6. COPD: No longer smoking. PFTs in 1/18 showed moderately severe obstructive defect. CT chest in 11/18 showed emphysema.   7. Atrial fibrillation: S/p atrial fibrillation ablation in 7/20 and redo in 5/21.  He has been primarily in NSR since redo ablation. EKG today - SR 56 bpm  - Continue Toprol XL 12.5 mg daily.  - Continue Eliquis 5 mg bid, check cbc today.  - Continue to limit ETOH.  - Continue to use CPAP.  8. CKD: Stage 3.   Check BMET  9. CAD: LHC in 11/18 with nonobstructive disease only.  Cardiolite in 6/21 with no ischemia/infarction. No chest pain.  10. 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  He is no longer on methimazole, follows with endocrinology.     11. Colon cancer: Sigmoid adenocarcinoma, s/p surgical resection. In remission.  12. HTN: Stable.   13. Pleural effusion: Chronic right pleural effusion, s/p right lower lobectomy.      Follow up next week to reassess volume status and check Reds Clip. Check BMET/CBC today.  Follow up with Dr Aundra Dubin in 3-4 months with an ECHO   Plez Belton NP-C  11/10/2020

## 2020-11-10 ENCOUNTER — Encounter (HOSPITAL_COMMUNITY): Payer: Self-pay

## 2020-11-10 ENCOUNTER — Ambulatory Visit (HOSPITAL_COMMUNITY)
Admission: RE | Admit: 2020-11-10 | Discharge: 2020-11-10 | Disposition: A | Discharge status: Home / Self Care | Payer: PPO | Source: Ambulatory Visit | Attending: Adult Health | Admitting: Adult Health

## 2020-11-10 ENCOUNTER — Other Ambulatory Visit: Payer: Self-pay

## 2020-11-10 VITALS — BP 112/64 | HR 56 | Wt 205.2 lb

## 2020-11-10 DIAGNOSIS — J449 Chronic obstructive pulmonary disease, unspecified: Secondary | ICD-10-CM | POA: Diagnosis not present

## 2020-11-10 DIAGNOSIS — I714 Abdominal aortic aneurysm, without rupture: Secondary | ICD-10-CM | POA: Insufficient documentation

## 2020-11-10 DIAGNOSIS — I6521 Occlusion and stenosis of right carotid artery: Secondary | ICD-10-CM | POA: Insufficient documentation

## 2020-11-10 DIAGNOSIS — Z87891 Personal history of nicotine dependence: Secondary | ICD-10-CM | POA: Insufficient documentation

## 2020-11-10 DIAGNOSIS — H409 Unspecified glaucoma: Secondary | ICD-10-CM | POA: Insufficient documentation

## 2020-11-10 DIAGNOSIS — Z8673 Personal history of transient ischemic attack (TIA), and cerebral infarction without residual deficits: Secondary | ICD-10-CM | POA: Insufficient documentation

## 2020-11-10 DIAGNOSIS — J90 Pleural effusion, not elsewhere classified: Secondary | ICD-10-CM | POA: Insufficient documentation

## 2020-11-10 DIAGNOSIS — I480 Paroxysmal atrial fibrillation: Secondary | ICD-10-CM | POA: Diagnosis not present

## 2020-11-10 DIAGNOSIS — Z85038 Personal history of other malignant neoplasm of large intestine: Secondary | ICD-10-CM | POA: Insufficient documentation

## 2020-11-10 DIAGNOSIS — I739 Peripheral vascular disease, unspecified: Secondary | ICD-10-CM | POA: Insufficient documentation

## 2020-11-10 DIAGNOSIS — R0602 Shortness of breath: Secondary | ICD-10-CM | POA: Insufficient documentation

## 2020-11-10 DIAGNOSIS — C187 Malignant neoplasm of sigmoid colon: Secondary | ICD-10-CM | POA: Diagnosis not present

## 2020-11-10 DIAGNOSIS — I130 Hypertensive heart and chronic kidney disease with heart failure and stage 1 through stage 4 chronic kidney disease, or unspecified chronic kidney disease: Secondary | ICD-10-CM | POA: Diagnosis not present

## 2020-11-10 DIAGNOSIS — E785 Hyperlipidemia, unspecified: Secondary | ICD-10-CM | POA: Insufficient documentation

## 2020-11-10 DIAGNOSIS — N183 Chronic kidney disease, stage 3 unspecified: Secondary | ICD-10-CM | POA: Insufficient documentation

## 2020-11-10 DIAGNOSIS — G4733 Obstructive sleep apnea (adult) (pediatric): Secondary | ICD-10-CM

## 2020-11-10 DIAGNOSIS — R0609 Other forms of dyspnea: Secondary | ICD-10-CM

## 2020-11-10 DIAGNOSIS — I5032 Chronic diastolic (congestive) heart failure: Secondary | ICD-10-CM | POA: Diagnosis not present

## 2020-11-10 DIAGNOSIS — Z902 Acquired absence of lung [part of]: Secondary | ICD-10-CM | POA: Diagnosis not present

## 2020-11-10 DIAGNOSIS — Z7951 Long term (current) use of inhaled steroids: Secondary | ICD-10-CM | POA: Diagnosis not present

## 2020-11-10 DIAGNOSIS — R0600 Dyspnea, unspecified: Secondary | ICD-10-CM | POA: Diagnosis not present

## 2020-11-10 DIAGNOSIS — Z79899 Other long term (current) drug therapy: Secondary | ICD-10-CM | POA: Insufficient documentation

## 2020-11-10 DIAGNOSIS — Z7901 Long term (current) use of anticoagulants: Secondary | ICD-10-CM | POA: Diagnosis not present

## 2020-11-10 DIAGNOSIS — E0590 Thyrotoxicosis, unspecified without thyrotoxic crisis or storm: Secondary | ICD-10-CM | POA: Diagnosis not present

## 2020-11-10 DIAGNOSIS — I2510 Atherosclerotic heart disease of native coronary artery without angina pectoris: Secondary | ICD-10-CM | POA: Diagnosis not present

## 2020-11-10 LAB — CBC
HCT: 38.3 % — ABNORMAL LOW (ref 39.0–52.0)
Hemoglobin: 12.4 g/dL — ABNORMAL LOW (ref 13.0–17.0)
MCH: 30.9 pg (ref 26.0–34.0)
MCHC: 32.4 g/dL (ref 30.0–36.0)
MCV: 95.5 fL (ref 80.0–100.0)
Platelets: 231 10*3/uL (ref 150–400)
RBC: 4.01 MIL/uL — ABNORMAL LOW (ref 4.22–5.81)
RDW: 13.8 % (ref 11.5–15.5)
WBC: 5.5 10*3/uL (ref 4.0–10.5)
nRBC: 0 % (ref 0.0–0.2)

## 2020-11-10 LAB — BASIC METABOLIC PANEL
Anion gap: 8 (ref 5–15)
BUN: 20 mg/dL (ref 8–23)
CO2: 25 mmol/L (ref 22–32)
Calcium: 8.9 mg/dL (ref 8.9–10.3)
Chloride: 105 mmol/L (ref 98–111)
Creatinine, Ser: 1.77 mg/dL — ABNORMAL HIGH (ref 0.61–1.24)
GFR, Estimated: 38 mL/min — ABNORMAL LOW (ref >60)
Glucose, Bld: 120 mg/dL — ABNORMAL HIGH (ref 70–99)
Potassium: 4.7 mmol/L (ref 3.5–5.1)
Sodium: 138 mmol/L (ref 135–145)

## 2020-11-10 LAB — BRAIN NATRIURETIC PEPTIDE: B Natriuretic Peptide: 195.9 pg/mL — ABNORMAL HIGH (ref 0.0–100.0)

## 2020-11-10 NOTE — Progress Notes (Signed)
ReDS Vest / Clip - 11/10/20 1300      ReDS Vest / Clip   Station Marker C    Ruler Value 30    ReDS Value Range Moderate volume overload    ReDS Actual Value 37

## 2020-11-10 NOTE — Patient Instructions (Signed)
INCREASE Lasix to 60 mg twice a day for 2 days, then resume  Normal dose of 40 mg twice a day on 11/13/20 INCREASE Potassium to 20 meq twice a day for 2 days, then resume normal dose of 20 meq daily on 11/13/20  Labs today We will only contact you if something comes back abnormal or we need to make some changes. Otherwise no news is good news!  Your physician recommends that you schedule a follow-up appointment in: 1 week  in the Advanced Practitioners (PA/NP) Clinic    Do the following things EVERYDAY: 1) Weigh yourself in the morning before breakfast. Write it down and keep it in a log. 2) Take your medicines as prescribed 3) Eat low salt foods--Limit salt (sodium) to 2000 mg per day.  4) Stay as active as you can everyday 5) Limit all fluids for the day to less than 2 liters  At the [facility], you and your health needs are our priority. As part of our continuing mission to provide you with exceptional heart care, we have created designated Provider Care Teams. These Care Teams include your primary Cardiologist (physician) and Advanced Practice Providers (APPs- Physician Assistants and Nurse Practitioners) who all work together to provide you with the care you need, when you need it.   You may see any of the following providers on your designated Care Team at your next follow up: [name] Dr Glori Bickers . Dr [name] . Darrick Grinder, NP . Lyda Jester, [place] . Audry Riles, PharmD   Please be sure to bring in all your medications bottles to every appointment.

## 2020-11-12 LAB — CUP PACEART REMOTE DEVICE CHECK
Date Time Interrogation Session: 20220210232913
Implantable Pulse Generator Implant Date: 20200709

## 2020-11-14 ENCOUNTER — Ambulatory Visit (INDEPENDENT_AMBULATORY_CARE_PROVIDER_SITE_OTHER): Payer: PPO

## 2020-11-14 DIAGNOSIS — I480 Paroxysmal atrial fibrillation: Secondary | ICD-10-CM

## 2020-11-16 ENCOUNTER — Ambulatory Visit (HOSPITAL_COMMUNITY)
Admission: RE | Admit: 2020-11-16 | Discharge: 2020-11-16 | Disposition: A | Discharge status: Home / Self Care | Payer: PPO | Source: Ambulatory Visit | Attending: Family Medicine | Admitting: Family Medicine

## 2020-11-16 ENCOUNTER — Other Ambulatory Visit: Payer: Self-pay

## 2020-11-16 ENCOUNTER — Encounter (HOSPITAL_COMMUNITY): Payer: Self-pay

## 2020-11-16 VITALS — BP 120/70 | HR 57 | Wt 204.6 lb

## 2020-11-16 DIAGNOSIS — Z8709 Personal history of other diseases of the respiratory system: Secondary | ICD-10-CM

## 2020-11-16 DIAGNOSIS — I739 Peripheral vascular disease, unspecified: Secondary | ICD-10-CM | POA: Diagnosis not present

## 2020-11-16 DIAGNOSIS — H409 Unspecified glaucoma: Secondary | ICD-10-CM | POA: Insufficient documentation

## 2020-11-16 DIAGNOSIS — Z8673 Personal history of transient ischemic attack (TIA), and cerebral infarction without residual deficits: Secondary | ICD-10-CM | POA: Diagnosis not present

## 2020-11-16 DIAGNOSIS — E785 Hyperlipidemia, unspecified: Secondary | ICD-10-CM | POA: Insufficient documentation

## 2020-11-16 DIAGNOSIS — Z87891 Personal history of nicotine dependence: Secondary | ICD-10-CM | POA: Insufficient documentation

## 2020-11-16 DIAGNOSIS — E0590 Thyrotoxicosis, unspecified without thyrotoxic crisis or storm: Secondary | ICD-10-CM

## 2020-11-16 DIAGNOSIS — I2510 Atherosclerotic heart disease of native coronary artery without angina pectoris: Secondary | ICD-10-CM | POA: Diagnosis not present

## 2020-11-16 DIAGNOSIS — J90 Pleural effusion, not elsewhere classified: Secondary | ICD-10-CM | POA: Insufficient documentation

## 2020-11-16 DIAGNOSIS — I714 Abdominal aortic aneurysm, without rupture, unspecified: Secondary | ICD-10-CM

## 2020-11-16 DIAGNOSIS — I5032 Chronic diastolic (congestive) heart failure: Secondary | ICD-10-CM | POA: Diagnosis not present

## 2020-11-16 DIAGNOSIS — I25119 Atherosclerotic heart disease of native coronary artery with unspecified angina pectoris: Secondary | ICD-10-CM | POA: Diagnosis not present

## 2020-11-16 DIAGNOSIS — J439 Emphysema, unspecified: Secondary | ICD-10-CM | POA: Insufficient documentation

## 2020-11-16 DIAGNOSIS — Z7901 Long term (current) use of anticoagulants: Secondary | ICD-10-CM | POA: Insufficient documentation

## 2020-11-16 DIAGNOSIS — Z85038 Personal history of other malignant neoplasm of large intestine: Secondary | ICD-10-CM | POA: Diagnosis not present

## 2020-11-16 DIAGNOSIS — I6523 Occlusion and stenosis of bilateral carotid arteries: Secondary | ICD-10-CM | POA: Diagnosis not present

## 2020-11-16 DIAGNOSIS — I130 Hypertensive heart and chronic kidney disease with heart failure and stage 1 through stage 4 chronic kidney disease, or unspecified chronic kidney disease: Secondary | ICD-10-CM | POA: Diagnosis not present

## 2020-11-16 DIAGNOSIS — I480 Paroxysmal atrial fibrillation: Secondary | ICD-10-CM | POA: Insufficient documentation

## 2020-11-16 DIAGNOSIS — Z79899 Other long term (current) drug therapy: Secondary | ICD-10-CM | POA: Diagnosis not present

## 2020-11-16 DIAGNOSIS — E782 Mixed hyperlipidemia: Secondary | ICD-10-CM | POA: Diagnosis not present

## 2020-11-16 DIAGNOSIS — N183 Chronic kidney disease, stage 3 unspecified: Secondary | ICD-10-CM | POA: Insufficient documentation

## 2020-11-16 DIAGNOSIS — I4891 Unspecified atrial fibrillation: Secondary | ICD-10-CM | POA: Diagnosis not present

## 2020-11-16 DIAGNOSIS — I10 Essential (primary) hypertension: Secondary | ICD-10-CM

## 2020-11-16 DIAGNOSIS — C187 Malignant neoplasm of sigmoid colon: Secondary | ICD-10-CM | POA: Diagnosis not present

## 2020-11-16 LAB — BASIC METABOLIC PANEL
Anion gap: 8 (ref 5–15)
BUN: 25 mg/dL — ABNORMAL HIGH (ref 8–23)
CO2: 27 mmol/L (ref 22–32)
Calcium: 9.1 mg/dL (ref 8.9–10.3)
Chloride: 103 mmol/L (ref 98–111)
Creatinine, Ser: 2.02 mg/dL — ABNORMAL HIGH (ref 0.61–1.24)
GFR, Estimated: 33 mL/min — ABNORMAL LOW (ref >60)
Glucose, Bld: 117 mg/dL — ABNORMAL HIGH (ref 70–99)
Potassium: 4.6 mmol/L (ref 3.5–5.1)
Sodium: 138 mmol/L (ref 135–145)

## 2020-11-16 MED ORDER — FUROSEMIDE 20 MG PO TABS: 40.0000 mg | ORAL_TABLET | Freq: Two times a day (BID) | ORAL | 3 refills | Status: DC

## 2020-11-16 NOTE — Patient Instructions (Addendum)
INCREASE Lasix 80 mg twice a day for 2 days then back to 40 mg twice a day thereafter  Labs today We will only contact you if something comes back abnormal or we need to make some changes. Otherwise no news is good news!  Labs needed in 7-10 days  Keep followup as scheduled  Do the following things EVERYDAY: 1) Weigh yourself in the morning before breakfast. Write it down and keep it in a log. 2) Take your medicines as prescribed 3) Eat low salt foods--Limit salt (sodium) to 2000 mg per day.  4) Stay as active as you can everyday 5) Limit all fluids for the day to less than 2 liters   At the [facility], you and your health needs are our priority. As part of our continuing mission to provide you with exceptional heart care, we have created designated Provider Care Teams. These Care Teams include your primary Cardiologist (physician) and Advanced Practice Providers (APPs- Physician Assistants and Nurse Practitioners) who all work together to provide you with the care you need, when you need it.   You may see any of the following providers on your designated Care Team at your next follow up: [name] Dr Glori Bickers . Dr [name] . Darrick Grinder, NP . Lyda Jester, [place], NP . Audry Riles, PharmD   Please be sure to bring in all your medications bottles to every appointment.

## 2020-11-16 NOTE — Progress Notes (Signed)
Patient ID: Troy Gerry [name], male   DOB: [birth year], 80 y.o.   MRN: [id number] PCP: Dr. Quay Burow Cardiology: [name]  80 y.o. with history of HTN, COPD, active smoking/COPD, carotid stenosis s/p right CEA, paroxysmal atrial fibrillation, and PAD.   He does not have known obstructive CAD but is at high risk for CAD based on his comorbidities.  Lexiscan Cardiolite in 3/14 showed no ischemia or infarction and echo in 3/14 showed normal EF.  He had a left fem-pop bypass at the [facility] in the '90s.  He is followed at [facility] for PAD. He has chronic right calf/thigh/buttocks claudication that is unchanged over the last few years and follows regularly at [facility].  He has had right lower lobectomy for lung cancer.  He continues to stay off cigarettes.    At a prior appointment, he was in atrial fibrillation but did not realize it. Dr Aundra Dubin  started him on Eliquis and diltiazem CD, and he spontaneously converted to NSR.  In 5/15, he was at [facility] and felt "strange" one day: fatigued, weak, short of breath.  He went to the ER and was in atrial fibrillation with HR in 80s-90s.  He spontaneously converted to NSR in the ER.  He felt back to normal after converting to NSR.  Started on Multaq 400 mg bid.  With CHF, he was eventually transitioned over to amiodarone.   He had patch angioplasty revision of left fem-pop bypass in 11/16.  Now with minimal claudication.  He follows with [facility].     He had a Cardiolite and echo in 7/17 that were unremarkable.  Repeat Cardiolite in 12/17 showed no ischemia, echo was uninterpretable.  Cardiac MRI was therefore done in 1/18, showing EF 66% with normal-appearing RV, no late gadolinium enhancement.   Given increased exertional dyspnea and a defect on Cardiolite, he had LHC in 11/18.  This showed no obstructive CAD. He had a chest CT done given concern for possible amiodarone lung toxicity with increased dyspnea. This showed emphysema, no ILD.    In 5/19, he developed a  probable viral syndrome with dehydration and had a presyncopal episode.  He was orthostatic in the hospital.  He was noted to be in atrial fibrillation this admission which remained persistent.  He had a TEE-guided DCCV back to NSR.   In 9/19, he was admitted with GI bleeding and found to have a sigmoid mass on colonoscopy, path showed sigmoid adenocarcinoma.  He was noted to have right paratracheal lymphadenopathy but EUS with biopsy in 10/19 did not show malignancy.  He had surgical resection of colon cancer.   Amiodarone was stopped with elevated ESR and increased dyspnea.  He was also found to have hyperthyroidism. He was treated with methimazole by endocrinology.    He had atrial fibrillation ablation in 7/20.   Echo in 4/21 showed EF 60-65%, normal RV.    In 5/21, due to high atrial fibrillation burden, he had redo atrial fibrillation ablation.   Cardiolite in 6/21 showed EF 70%, no ischemia/infarction.   Today he returns for HF follow up. Was seen last week in clinic and was volume overloaded in the setting of increased dietary salt intake, weight was up 8-10 lbs. Lasix increased to 60 mg bid x 3 days, then back to 20 mg bid. Overall feeling fine. Lost about 5 lbs, but started drinking more water this past week. Feels breathing is better. Can walk 6 laps in driveway now. Denies increasing SOB, CP, dizziness,  edema, or PND/Orthopnea. Appetite ok. No fever or chills. Weight at home ~195-196 pounds. Taking all medications. He has caregivers 5 days a week, eats out 3 days/week. Reviewed his diet habits from past week, still eating very high-sodium foods. Visually limited due to acute glaucoma.  ICD Interrogation (personally reviewed): No recent AT/AF, daily activity less than 30 mins/day  Labs (3/13): LDL 75, HDL 35, K 4.1, creatinine 1.1 Labs (3/14): LDL 91, HDL 27 Labs (10/14): K 4.3, creatinine 0.98 Labs (4/15): K 4.9, creatinine 1.1, LDL 76, HDL 30 Labs (5/15): K 4.3, creatinine 1.7, BNP  251 Labs (6/15): K 4.4, creatinine 1.3 Labs (10/15): TSH normal Labs (5/16): K 4.2, creatinine 1.12, HCT 39.5, LFTs normal, LDL 84, HDL 43, TSH normal Labs (11/16): creatinine 0.94 Labs (4/17): LDL 39, HDL 39 Labs (6/17): K 4.5, creatinine 1.2 Labs (9/17): K 4, creatinine 1.25, HCT 38.7 Labs (12/17): K 4.5, creatinine 1.49 => 1.62, BNP 43 Labs (2/18): K 3.8, creatinine 1.33, LFTs normal, TSH normal Labs (6/18): K 4.3, creatinine 1.49, LFTs normal, TSH normal, hgb 12.3 Labs (11/18): ESR 60, TSH normal, LFTs normal, creatinine 1.34 Labs (1/19): LDL 50 Labs (5/19): K 4.6, creatinine 1.55, LFTs normal, hgb 12.7, LFTs normal Labs (10/19): K 4 => 4.1, creatinine 1.39 => 1.43, BNP 279, hgb 10.7, TSH low, free T4 and free T3 low, ESR 108 Labs (3/21): K 4.5, creatinine 1.19, TSH normal Labs (4/21): K 4.8, creatinine 1.28, BNP 524, LDL 59 Labs (7/21): hgb 11.5, creatinine 1.4 Labs (8/21): K 4.3, creatinine 1.09 Labs (9/21): hgb 12.7 Labs (2/22): K 4.7, creatinine 1.77, hgb 12.4  PMH: 1. HTN: Fatigue and cough with ramipril use.  2. COPD: Quit smoking 2014. PFTs (1/18) with moderately severe COPD.  - CT chest (11/18): Emphysema noted, no ILD.  3. AAA: CT 1/13 with 3.0 cm AAA.  Abdominal US (1/14) with 3.4 cm AAA. Abdominal US (4/15) with 3.25 x 3.27 AAA. Abdominal US (3/16) with 3.7 cm AAA.  Followed at [facility].  - Abd [name] (1/19): 3.3 cm AAA. 4. Squamous cell lung cancer diagnosed 2/12.  Had right lower lobectomy in 4/12.  5. Hyperlipidemia 6. PAD: Left fem-pop bypass 1994.  ABIs (2/12) 0.62 on right, 0.95 on left. ABIs (1/14): 0.65 on right, 1.04 on left. ABIs (4/15) 0.66 right, 0.87 left.  ABIs (3/16) 0.6 right, 0.88 left.  Patch angioplasty left fem-pop bypass in 11/16.   - Left fem-pop bypass patent on doppler evaluation in 12/17.  - ABIs (2/21): normal on left (1.02), moderately decreased on right (0.54).  7. CAD: Stress myoview 2004 was normal. Lexiscan Cardiolite (3/14) with EF 66%, no  ischemia or infarction. Lexiscan Cardiolite (7/17) with EF 61%, no ischemia/infarction.  - Cardiolite (12/17) with EF 62%, inferior/inferolateral fixed defect, most likely diaphragmatic attenuation, no ischemia.  - Cardiolite (9/18) with EF > 65%, fixed inferior defect (attenuation versus infarction), no ischemia.  - LHC (11/18): No obstructive CAD.  - Cardiolite (6/21): EF 70%, no ischemia/infarction 8. Chronic diastolic CHF: Echo (1/54): technically difficult with EF 55-60%, upper normal RV size.  Echo (5/16) with EF 60-65%.  Echo (7/17) with EF 55-60%, normal RV size and systolic function.  - Echo 12/17 with very poor windows, unable to comment on LV or RV function.  - Cardiac MRI (1/18) with EF 66%, normal RV size and systolic function.  - TEE (5/19): EF 55-60%, normal RV size and systolic function.  - Echo (4/21): EF 60-65%, normal RV 9. Carotid stenosis: TIA 10/14.  Carotid dopplers with > 74% RICA, 08-14% LICA.  Patient had right CEA in 10/14. Carotids (4/15) patent right CEA, LICA 48-18% stenosis.  Carotids (56/31) < 49% LICA, patent right CEA.  - Carotid dopplers (12/17): right CEA ok, < 70% LICA stenosis.  - Carotid dopplers (1/19): Right CEA ok, 2-63% LICA stenosis.  - Carotid dopplers (2/21): Right CEA ok, LICA 78-58% stenosis.  10. Atrial fibrillation: Paroxysmal. First noted after lobectomy in 4/12 (brief), recurrence in 4/15 then in 5/15.  - TEE-guided DCCV for persistent atrial fibrillation in 5/19.  - Atrial fibrillation ablation 7/20.  - Re-do atrial fibrillation ablation 5/21.  11. Spinal stenosis 12. OSA: Using CPAP.  13. Glaucoma 14. Has ILR 15. Colon cancer: Sigmoid adenocarcinoma, s/p surgical resection.  16. 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   SH: Married, lives in [place].  Former [place].  Quit smoking in 2014.  Rare ETOH now.   FH: No premature CAD  ROS: All systems reviewed and negative except as per HPI.   Current  Outpatient Medications  Medication Sig Dispense Refill  . amLODipine (NORVASC) 5 MG tablet TAKE 1 TABLET ONCE DAILY. 28 tablet 0  . Ascorbic Acid (VITAMIN C PO) Take 500 mg by mouth in the morning and at bedtime.     . Biotin 2500 MCG CAPS Take 2,500 mcg by mouth daily.    . Brinzolamide-Brimonidine (SIMBRINZA) 1-0.2 % SUSP Place 1 drop into both eyes 2 times daily.    . budesonide-formoterol (SYMBICORT) 160-4.5 MCG/ACT inhaler Inhale 2 puffs into the lungs 2 (two) times daily.    [name] docusate sodium (COLACE) 100 MG capsule Take 100 mg by mouth 2 (two) times daily.     [name] ELIQUIS 5 MG TABS tablet TAKE 1 TABLET BY MOUTH TWICE DAILY. 56 tablet 0  . escitalopram (LEXAPRO) 10 MG tablet TAKE 1 TABLET BY MOUTH DAILY. 28 tablet 5  . ferrous sulfate 325 (65 FE) MG tablet Take 325 mg by mouth 2 (two) times daily with a meal.     . latanoprost (XALATAN) 0.005 % ophthalmic solution     . losartan (COZAAR) 25 MG tablet TAKE (2) TABLETS BY MOUTH DAILY. 56 tablet 5  . Magnesium 500 MG CAPS Take 500 mg by mouth daily.     . Melatonin 5 MG TABS Take 5 mg by mouth at bedtime.    . metoprolol succinate (TOPROL-XL) 25 MG 24 hr tablet TAKE 1/2 TABLET IN THE EVENING WITH OR IMMEDIATELY FOLLOWING A MEAL 14 tablet 0  . montelukast (SINGULAIR) 10 MG tablet TAKE ONE TABLET AT BEDTIME. 28 tablet 5  . Multiple Vitamins-Minerals (CENTRUM SILVER PO) Take 1 tablet by mouth daily.    . Multiple Vitamins-Minerals (ZINC PO) Take 1 tablet by mouth daily.    [name] omeprazole (PRILOSEC) 20 MG capsule TAKE (1) CAPSULE TWICE DAILY. 60 capsule 3  . PEG-KCl-NaCl-NaSulf-Na Asc-C (PLENVU) 140 g SOLR Take 1 kit by mouth as directed. Pt has a manufacturers coupon to bring to  Pharmacy 1 each 0  . Polyethyl Glycol-Propyl Glycol 0.4-0.3 % SOLN Place 1 drop into both eyes daily as needed (for dry eyes).     . potassium chloride SA (KLOR-CON) 20 MEQ tablet TAKE 1 TABLET ONCE DAILY. 28 tablet 0  . rosuvastatin (CRESTOR) 40 MG tablet TAKE 1 TABLET  ONCE DAILY. 28 tablet 0  . senna (SENOKOT) 8.6 MG tablet Take 1 tablet by mouth 2 (two) times daily.    . furosemide (LASIX) 20 MG  tablet Take 2 tablets (40 mg total) by mouth 2 (two) times daily. 120 tablet 3  . omeprazole (PRILOSEC) 40 MG capsule Take 1 capsule (40 mg total) by mouth in the morning and at bedtime. 40 mg twice daily for 30 days and then drop down to 20 mg twice daily (Patient not taking: No sig reported) 90 capsule 3   No current facility-administered medications for this encounter.   Wt Readings from Last 3 Encounters:  11/16/20 92.8 kg (204 lb 9.6 oz)  11/10/20 93.1 kg (205 lb 3.2 oz)  10/18/20 92.5 kg (204 lb)   BP 120/70   Pulse (!) 57   Wt 92.8 kg (204 lb 9.6 oz)   SpO2 95%   BMI 26.63 kg/m   Reds Clip 37%.  General:  NAD. No resp difficulty, well-appearing HEENT: Normal Neck: Supple. JVP 5-6. Carotids 2+ bilat; no bruits. No lymphadenopathy or thryomegaly appreciated. Cor: PMI nondisplaced. Regular rate & rhythm. No rubs, gallops or murmurs. Lungs: Clear, diminished lower lobes, absent RLL. Abdomen: Soft, nontender, nondistended. No hepatosplenomegaly. No bruits or masses. Good bowel sounds. Extremities: No cyanosis, clubbing, rash, 1+ LE edema Neuro: alert & oriented x 3, cranial nerves grossly intact. Moves all 4 extremities w/o difficulty. Affect pleasant.  Reds Clip: 37%  Assessment/Plan: 1. Chronic diastolic CHF: Echo in 0/08 with EF 60-65%, normal RV.   NYHA III. Reds Clip still 37%, weight down 1 lb Volume status elevated suspect in the setting of high sodium foods and increased fluid intake.  - He does not want IV or SQ diuretics. Increase lasix to 80 mg bid x 2 days with extra 20 meq of potassium.  - Then back to lasix to 40 mg twice a day + 20 meq Potassium BMET today, repeat 7-10 days. - We discussed low salt food choices.   2. Hyperlipidemia: Continue Crestor.   3. Carotid stenosis: s/p R CEA.  Dopplers followed at [facility].  4. PAD: s/p patch  angioplasty to left fem-pop bypass in 11/16.  Followed at [facility].  Moderately decreased ABI on right in 2/21, but no pain or pedal ulcerations.  Stable mild claudication.  5. AAA: Stable on last [name], followed at [facility].    6. COPD: No longer smoking. PFTs in 1/18 showed moderately severe obstructive defect. CT chest in 11/18 showed emphysema.   7. Atrial fibrillation: S/p atrial fibrillation ablation in 7/20 and redo in 5/21.  He has been primarily in NSR since redo ablation. EKG [name] - Continue Toprol XL 12.5 mg daily.  - Continue Eliquis 5 mg bid. - Continue to limit ETOH.  - Continue to use CPAP.  8. CKD: Stage 3.   Check BMET  9. CAD: LHC in 11/18 with nonobstructive disease only.  Cardiolite in 6/21 with no ischemia/infarction. No chest pain.  10. 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  He is no longer on methimazole, follows with endocrinology.     11. Colon cancer: Sigmoid adenocarcinoma, s/p surgical resection. In remission.  12. HTN: Stable.   13. Pleural effusion: Chronic right pleural effusion, s/p right lower lobectomy.      - Labs in 7-10 days, has follow up in 3 weeks with [name].  - Instructed patient if he gains >3 lbs in 24 hours or 5 lbs in 1 week, he would need to be seen back in clinic before his follow up with [name]. Patient agreeable to daily weights and limiting salt intake.    Maricela Bo [facility] FNP-BC  11/16/2020

## 2020-11-16 NOTE — Progress Notes (Signed)
ReDS Vest / Clip - 11/16/20 1400      ReDS Vest / Clip   Station Marker C    Ruler Value 30    ReDS Value Range Moderate volume overload    ReDS Actual Value 37

## 2020-11-17 NOTE — Progress Notes (Signed)
Carelink Summary Report / Loop Recorder 

## 2020-11-18 ENCOUNTER — Encounter (HOSPITAL_COMMUNITY): Payer: Self-pay

## 2020-11-21 ENCOUNTER — Telehealth (HOSPITAL_COMMUNITY): Payer: Self-pay | Admitting: Cardiology

## 2020-11-21 ENCOUNTER — Other Ambulatory Visit (HOSPITAL_COMMUNITY): Payer: Self-pay | Admitting: Cardiology

## 2020-11-21 DIAGNOSIS — I509 Heart failure, unspecified: Secondary | ICD-10-CM

## 2020-11-21 DIAGNOSIS — R0602 Shortness of breath: Secondary | ICD-10-CM

## 2020-11-21 DIAGNOSIS — I5032 Chronic diastolic (congestive) heart failure: Secondary | ICD-10-CM

## 2020-11-21 MED ORDER — FUROSEMIDE 20 MG PO TABS: 20.0000 mg | ORAL_TABLET | Freq: Two times a day (BID) | ORAL | 3 refills | Status: DC

## 2020-11-21 NOTE — Telephone Encounter (Signed)
-----   Message from Rafael Bihari, [place] sent at 11/16/2020  4:29 PM EST ----- Kidney function still elevated. Please only take 60 mg lasix bid x 2 days (not 80), then back down to 20 mg bid. Repeat labs next week.

## 2020-11-21 NOTE — Telephone Encounter (Signed)
[phone] (H) no answer, unable to leave message VM full [phone] (M) pt aware via daughter, repeat labs 2/24

## 2020-11-23 ENCOUNTER — Other Ambulatory Visit: Payer: Self-pay

## 2020-11-23 ENCOUNTER — Ambulatory Visit (HOSPITAL_COMMUNITY)
Admission: RE | Admit: 2020-11-23 | Discharge: 2020-11-23 | Disposition: A | Discharge status: Home / Self Care | Payer: PPO | Source: Ambulatory Visit | Attending: Cardiology | Admitting: Cardiology

## 2020-11-23 ENCOUNTER — Telehealth: Payer: Self-pay | Admitting: Internal Medicine

## 2020-11-23 DIAGNOSIS — I5032 Chronic diastolic (congestive) heart failure: Secondary | ICD-10-CM | POA: Insufficient documentation

## 2020-11-23 LAB — BASIC METABOLIC PANEL
Anion gap: 10 (ref 5–15)
BUN: 20 mg/dL (ref 8–23)
CO2: 24 mmol/L (ref 22–32)
Calcium: 9.3 mg/dL (ref 8.9–10.3)
Chloride: 105 mmol/L (ref 98–111)
Creatinine, Ser: 1.59 mg/dL — ABNORMAL HIGH (ref 0.61–1.24)
GFR, Estimated: 44 mL/min — ABNORMAL LOW (ref >60)
Glucose, Bld: 122 mg/dL — ABNORMAL HIGH (ref 70–99)
Potassium: 5 mmol/L (ref 3.5–5.1)
Sodium: 139 mmol/L (ref 135–145)

## 2020-11-23 NOTE — Progress Notes (Signed)
  Chronic Care Management   Note  11/23/2020 Name: Pieter Fooks [name] MRN: [id number] DOB: [birth year]  [name] is a 80 y.o. year old male who is a primary care patient of Burns, Claudina Lick, MD. I reached out to [name] by phone today in response to a referral sent by Mr. Rui Wordell [name]'s PCP, Binnie Rail, MD.   [name] was given information about Chronic Care Management services today including:  1. CCM service includes personalized support from designated clinical staff supervised by his physician, including individualized plan of care and coordination with other care providers 2. 24/7 contact phone numbers for assistance for urgent and routine care needs. 3. Service will only be billed when office clinical staff spend 20 minutes or more in a month to coordinate care. 4. Only one practitioner may furnish and bill the service in a calendar month. 5. The patient may stop CCM services at any time (effective at the end of the month) by phone call to the office staff.   Patient agreed to services and verbal consent obtained.   Follow up plan:   Carley Perdue [employer] Scheduler

## 2020-11-24 ENCOUNTER — Other Ambulatory Visit (HOSPITAL_COMMUNITY): Payer: PPO

## 2020-11-28 ENCOUNTER — Other Ambulatory Visit: Payer: Self-pay | Admitting: Internal Medicine

## 2020-12-05 DIAGNOSIS — H401133 Primary open-angle glaucoma, bilateral, severe stage: Secondary | ICD-10-CM | POA: Diagnosis not present

## 2020-12-07 ENCOUNTER — Other Ambulatory Visit: Payer: Self-pay

## 2020-12-07 ENCOUNTER — Encounter (HOSPITAL_COMMUNITY): Payer: Self-pay | Admitting: Cardiology

## 2020-12-07 ENCOUNTER — Ambulatory Visit (HOSPITAL_COMMUNITY)
Admission: RE | Admit: 2020-12-07 | Discharge: 2020-12-07 | Disposition: A | Discharge status: Home / Self Care | Payer: PPO | Source: Ambulatory Visit | Attending: Cardiology | Admitting: Cardiology

## 2020-12-07 VITALS — BP 130/62 | HR 55 | Wt 205.2 lb

## 2020-12-07 DIAGNOSIS — J449 Chronic obstructive pulmonary disease, unspecified: Secondary | ICD-10-CM | POA: Diagnosis not present

## 2020-12-07 DIAGNOSIS — I480 Paroxysmal atrial fibrillation: Secondary | ICD-10-CM | POA: Insufficient documentation

## 2020-12-07 DIAGNOSIS — Z79899 Other long term (current) drug therapy: Secondary | ICD-10-CM | POA: Insufficient documentation

## 2020-12-07 DIAGNOSIS — Z8673 Personal history of transient ischemic attack (TIA), and cerebral infarction without residual deficits: Secondary | ICD-10-CM | POA: Insufficient documentation

## 2020-12-07 DIAGNOSIS — J90 Pleural effusion, not elsewhere classified: Secondary | ICD-10-CM | POA: Diagnosis not present

## 2020-12-07 DIAGNOSIS — Z85038 Personal history of other malignant neoplasm of large intestine: Secondary | ICD-10-CM | POA: Diagnosis not present

## 2020-12-07 DIAGNOSIS — Z7984 Long term (current) use of oral hypoglycemic drugs: Secondary | ICD-10-CM | POA: Diagnosis not present

## 2020-12-07 DIAGNOSIS — Z7951 Long term (current) use of inhaled steroids: Secondary | ICD-10-CM | POA: Insufficient documentation

## 2020-12-07 DIAGNOSIS — N183 Chronic kidney disease, stage 3 unspecified: Secondary | ICD-10-CM | POA: Diagnosis not present

## 2020-12-07 DIAGNOSIS — Z95820 Peripheral vascular angioplasty status with implants and grafts: Secondary | ICD-10-CM | POA: Diagnosis not present

## 2020-12-07 DIAGNOSIS — I4891 Unspecified atrial fibrillation: Secondary | ICD-10-CM

## 2020-12-07 DIAGNOSIS — I2510 Atherosclerotic heart disease of native coronary artery without angina pectoris: Secondary | ICD-10-CM | POA: Insufficient documentation

## 2020-12-07 DIAGNOSIS — I5032 Chronic diastolic (congestive) heart failure: Secondary | ICD-10-CM | POA: Insufficient documentation

## 2020-12-07 DIAGNOSIS — Z7901 Long term (current) use of anticoagulants: Secondary | ICD-10-CM | POA: Insufficient documentation

## 2020-12-07 DIAGNOSIS — Z902 Acquired absence of lung [part of]: Secondary | ICD-10-CM | POA: Diagnosis not present

## 2020-12-07 DIAGNOSIS — I739 Peripheral vascular disease, unspecified: Secondary | ICD-10-CM | POA: Insufficient documentation

## 2020-12-07 DIAGNOSIS — I130 Hypertensive heart and chronic kidney disease with heart failure and stage 1 through stage 4 chronic kidney disease, or unspecified chronic kidney disease: Secondary | ICD-10-CM | POA: Diagnosis not present

## 2020-12-07 DIAGNOSIS — E782 Mixed hyperlipidemia: Secondary | ICD-10-CM | POA: Diagnosis not present

## 2020-12-07 DIAGNOSIS — Z87891 Personal history of nicotine dependence: Secondary | ICD-10-CM | POA: Insufficient documentation

## 2020-12-07 DIAGNOSIS — I6529 Occlusion and stenosis of unspecified carotid artery: Secondary | ICD-10-CM | POA: Insufficient documentation

## 2020-12-07 DIAGNOSIS — I714 Abdominal aortic aneurysm, without rupture: Secondary | ICD-10-CM | POA: Insufficient documentation

## 2020-12-07 LAB — BASIC METABOLIC PANEL
Anion gap: 9 (ref 5–15)
BUN: 19 mg/dL (ref 8–23)
CO2: 24 mmol/L (ref 22–32)
Calcium: 9.5 mg/dL (ref 8.9–10.3)
Chloride: 104 mmol/L (ref 98–111)
Creatinine, Ser: 1.62 mg/dL — ABNORMAL HIGH (ref 0.61–1.24)
GFR, Estimated: 43 mL/min — ABNORMAL LOW (ref >60)
Glucose, Bld: 115 mg/dL — ABNORMAL HIGH (ref 70–99)
Potassium: 4.5 mmol/L (ref 3.5–5.1)
Sodium: 137 mmol/L (ref 135–145)

## 2020-12-07 LAB — LIPID PANEL
Cholesterol: 101 mg/dL (ref 0–200)
HDL: 34 mg/dL — ABNORMAL LOW (ref >40)
LDL Cholesterol: 31 mg/dL (ref 0–99)
Total CHOL/HDL Ratio: 3 RATIO
Triglycerides: 179 mg/dL — ABNORMAL HIGH (ref <150)
VLDL: 36 mg/dL (ref 0–40)

## 2020-12-07 LAB — BRAIN NATRIURETIC PEPTIDE: B Natriuretic Peptide: 274.8 pg/mL — ABNORMAL HIGH (ref 0.0–100.0)

## 2020-12-07 MED ORDER — EMPAGLIFLOZIN 10 MG PO TABS: 10.0000 mg | ORAL_TABLET | Freq: Every day | ORAL | 11 refills | Status: DC

## 2020-12-07 MED ORDER — LOSARTAN POTASSIUM 25 MG PO TABS: 25.0000 mg | ORAL_TABLET | Freq: Every day | ORAL | 3 refills | Status: DC

## 2020-12-07 MED ORDER — FUROSEMIDE 20 MG PO TABS: ORAL_TABLET | ORAL | 11 refills | Status: DC

## 2020-12-07 NOTE — Patient Instructions (Addendum)
EKG done today.  Labs done today. We will contact you only if your labs are abnormal.  START Jardiance 10mg  (1 tablet) by mouth daily.  INCREASE Lasix 40mg  (2 tablets) by mouth 2 times daily for 3 days THEN DECREASE to 40mg (2 tablets)  every morning and 20mg  (1 tablet) by mouth every evening.  DECREASE Losartan to 25mg  (1 tablet) by mouth daily.  No other medication changes were made. Please continue all current medications as prescribed.  Vascular & Vein specialist will be in contact with you about scheduling an appointment.  Your physician recommends that you schedule a follow-up appointment in: 10 days for a lab only appointment and in 3 weeks for an appointment with our [facility] here in office.   If you have any questions or concerns before your next appointment please send [name] a message through [place] or call our office at [phone].    TO LEAVE A MESSAGE FOR THE NURSE SELECT OPTION 2, PLEASE LEAVE A MESSAGE INCLUDING: . YOUR NAME . DATE OF BIRTH . CALL BACK NUMBER . REASON FOR CALL**this is important as we prioritize the call backs  YOU WILL RECEIVE A CALL BACK THE SAME DAY AS LONG AS YOU CALL BEFORE 4:00 PM   Do the following things EVERYDAY: 1) Weigh yourself in the morning before breakfast. Write it down and keep it in a log. 2) Take your medicines as prescribed 3) Eat low salt foods--Limit salt (sodium) to 2000 mg per day.  4) Stay as active as you can everyday 5) Limit all fluids for the day to less than 2 liters   At the [facility], you and your health needs are our priority. As part of our continuing mission to provide you with exceptional heart care, we have created designated Provider Care Teams. These Care Teams include your primary Cardiologist (physician) and Advanced Practice Providers (APPs- Physician Assistants and Nurse Practitioners) who all work together to provide you with the care you need, when you need it.   You may see any of  the following providers on your designated Care Team at your next follow up: [name] Dr Glori Bickers . Dr [name] . Darrick Grinder, NP . Lyda Jester, PA . Audry Riles, PharmD   Please be sure to bring in all your medications bottles to every appointment.

## 2020-12-08 NOTE — Progress Notes (Signed)
Patient ID: Troy Olenik [name], male   DOB: Nov 06, 1940, 80 y.o.   MRN: [id number] PCP: Dr. Quay Burow Cardiology: [name]  80 y.o. with history of HTN, COPD, active smoking/COPD, carotid stenosis s/p right CEA, paroxysmal atrial fibrillation, and PAD.   He does not have known obstructive CAD but is at high risk for CAD based on his comorbidities.  Lexiscan Cardiolite in 3/14 showed no ischemia or infarction and echo in 3/14 showed normal EF.  He had a left fem-pop bypass at the [facility] in the '90s.  He is followed at [facility] for PAD. He has chronic right calf/thigh/buttocks claudication that is unchanged over the last few years and follows regularly at [facility].  He has had right lower lobectomy for lung cancer.  He continues to stay off cigarettes.    At a prior appointment, he was in atrial fibrillation but did not realize it. Dr Aundra Dubin  started him on Eliquis and diltiazem CD, and he spontaneously converted to NSR.  In 5/15, he was at [facility] and felt "strange" one day: fatigued, weak, short of breath.  He went to the ER and was in atrial fibrillation with HR in 80s-90s.  He spontaneously converted to NSR in the ER.  He felt back to normal after converting to NSR.  Started on Multaq 400 mg bid.  With CHF, he was eventually transitioned over to amiodarone.   He had patch angioplasty revision of left fem-pop bypass in 11/16.  Now with minimal claudication.  He follows with [facility].     He had a Cardiolite and echo in 7/17 that were unremarkable.  Repeat Cardiolite in 12/17 showed no ischemia, echo was uninterpretable.  Cardiac MRI was therefore done in 1/18, showing EF 66% with normal-appearing RV, no late gadolinium enhancement.   Given increased exertional dyspnea and a defect on Cardiolite, he had LHC in 11/18.  This showed no obstructive CAD. He had a chest CT done given concern for possible amiodarone lung toxicity with increased dyspnea. This showed emphysema, no ILD.    In 5/19, he developed a  probable viral syndrome with dehydration and had a presyncopal episode.  He was orthostatic in the hospital.  He was noted to be in atrial fibrillation this admission which remained persistent.  He had a TEE-guided DCCV back to NSR.   In 9/19, he was admitted with GI bleeding and found to have a sigmoid mass on colonoscopy, path showed sigmoid adenocarcinoma.  He was noted to have right paratracheal lymphadenopathy but EUS with biopsy in 10/19 did not show malignancy.  He had surgical resection of colon cancer.   Amiodarone was stopped with elevated ESR and increased dyspnea.  He was also found to have hyperthyroidism. He was treated with methimazole by endocrinology.    He had atrial fibrillation ablation in 7/20.   Echo in 4/21 showed EF 60-65%, normal RV.    In 5/21, due to high atrial fibrillation burden, he had redo atrial fibrillation ablation.   Cardiolite in 6/21 showed EF 70%, no ischemia/infarction.   He returns today for followup of CHF.  Weight is up 1 lb.  He is in NSR.  He is short of breath walking about 100 feet.  Walks in driveway with his trainer a couple of times a week.  No chest pain.  No orthopnea/PND.  Gets cramping in his right leg. Legs are swelling.  Lasix dose has been adjusted up and down with creatinine level.   ECG (personally reviewed): sinus  brady 57  Labs (3/13): LDL 75, HDL 35, K 4.1, creatinine 1.1 Labs (3/14): LDL 91, HDL 27 Labs (10/14): K 4.3, creatinine 0.98 Labs (4/15): K 4.9, creatinine 1.1, LDL 76, HDL 30 Labs (5/15): K 4.3, creatinine 1.7, BNP 251 Labs (6/15): K 4.4, creatinine 1.3 Labs (10/15): TSH normal Labs (5/16): K 4.2, creatinine 1.12, HCT 39.5, LFTs normal, LDL 84, HDL 43, TSH normal Labs (11/16): creatinine 0.94 Labs (4/17): LDL 39, HDL 39 Labs (6/17): K 4.5, creatinine 1.2 Labs (9/17): K 4, creatinine 1.25, HCT 38.7 Labs (12/17): K 4.5, creatinine 1.49 => 1.62, BNP 43 Labs (2/18): K 3.8, creatinine 1.33, LFTs normal, TSH normal Labs  (6/18): K 4.3, creatinine 1.49, LFTs normal, TSH normal, hgb 12.3 Labs (11/18): ESR 60, TSH normal, LFTs normal, creatinine 1.34 Labs (1/19): LDL 50 Labs (5/19): K 4.6, creatinine 1.55, LFTs normal, hgb 12.7, LFTs normal Labs (10/19): K 4 => 4.1, creatinine 1.39 => 1.43, BNP 279, hgb 10.7, TSH low, free T4 and free T3 low, ESR 108 Labs (3/21): K 4.5, creatinine 1.19, TSH normal Labs (4/21): K 4.8, creatinine 1.28, BNP 524, LDL 59 Labs (7/21): hgb 11.5, creatinine 1.4 Labs (8/21): K 4.3, creatinine 1.09 Labs (9/21): hgb 12.7 Labs (2/22): K 5, creatinine 1.54  PMH: 1. HTN: Fatigue and cough with ramipril use.  2. COPD: Quit smoking 2014. PFTs (1/18) with moderately severe COPD.  - CT chest (11/18): Emphysema noted, no ILD.  3. AAA: CT 1/13 with 3.0 cm AAA.  Abdominal US (1/14) with 3.4 cm AAA. Abdominal US (4/15) with 3.25 x 3.27 AAA. Abdominal US (3/16) with 3.7 cm AAA.  Followed at [facility].  - Abd [name] (1/19): 3.3 cm AAA. 4. Squamous cell lung cancer diagnosed 2/12.  Had right lower lobectomy in 4/12.  5. Hyperlipidemia 6. PAD: Left fem-pop bypass 1994.  ABIs (2/12) 0.62 on right, 0.95 on left. ABIs (1/14): 0.65 on right, 1.04 on left. ABIs (4/15) 0.66 right, 0.87 left.  ABIs (3/16) 0.6 right, 0.88 left.  Patch angioplasty left fem-pop bypass in 11/16.   - Left fem-pop bypass patent on doppler evaluation in 12/17.  - ABIs (2/21): normal on left (1.02), moderately decreased on right (0.54).  7. CAD: Stress myoview 2004 was normal. Lexiscan Cardiolite (3/14) with EF 66%, no ischemia or infarction. Lexiscan Cardiolite (7/17) with EF 61%, no ischemia/infarction.  - Cardiolite (12/17) with EF 62%, inferior/inferolateral fixed defect, most likely diaphragmatic attenuation, no ischemia.  - Cardiolite (9/18) with EF > 65%, fixed inferior defect (attenuation versus infarction), no ischemia.  - LHC (11/18): No obstructive CAD.  - Cardiolite (6/21): EF 70%, no ischemia/infarction 8. Chronic diastolic CHF:  Echo (1/63): technically difficult with EF 55-60%, upper normal RV size.  Echo (5/16) with EF 60-65%.  Echo (7/17) with EF 55-60%, normal RV size and systolic function.  - Echo 12/17 with very poor windows, unable to comment on LV or RV function.  - Cardiac MRI (1/18) with EF 66%, normal RV size and systolic function.  - TEE (5/19): EF 55-60%, normal RV size and systolic function.  - Echo (4/21): EF 60-65%, normal RV 9. Carotid stenosis: TIA 10/14.  Carotid dopplers with > 84% RICA, 66-59% LICA.  Patient had right CEA in 10/14. Carotids (4/15) patent right CEA, LICA 93-57% stenosis.  Carotids (01/77) < 93% LICA, patent right CEA.  - Carotid dopplers (12/17): right CEA ok, < 90% LICA stenosis.  - Carotid dopplers (1/19): Right CEA ok, 3-00% LICA stenosis.  - Carotid dopplers (2/21):  Right CEA ok, LICA 94-17% stenosis.  10. Atrial fibrillation: Paroxysmal. First noted after lobectomy in 4/12 (brief), recurrence in 4/15 then in 5/15.  - TEE-guided DCCV for persistent atrial fibrillation in 5/19.  - Atrial fibrillation ablation 7/20.  - Re-do atrial fibrillation ablation 5/21.  11. Spinal stenosis 12. OSA: Using CPAP.  13. Glaucoma 14. Has ILR 15. Colon cancer: Sigmoid adenocarcinoma, s/p surgical resection.  16. 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   SH: Married, lives in [place].  Former [place].  Quit smoking in 2014.  Rare ETOH now.   FH: No premature CAD  ROS: All systems reviewed and negative except as per HPI.   Current Outpatient Medications  Medication Sig Dispense Refill  . amLODipine (NORVASC) 5 MG tablet TAKE 1 TABLET ONCE DAILY. 60 tablet 1  . Ascorbic Acid (VITAMIN C PO) Take 500 mg by mouth in the morning and at bedtime.     . Biotin 2500 MCG CAPS Take 2,500 mcg by mouth daily.    . Brinzolamide-Brimonidine (SIMBRINZA) 1-0.2 % SUSP Place 1 drop into both eyes 2 times daily.    [name] docusate sodium (COLACE) 100 MG capsule Take 100 mg by mouth  2 (two) times daily.     [name] ELIQUIS 5 MG TABS tablet TAKE 1 TABLET BY MOUTH TWICE DAILY. 120 tablet 1  . empagliflozin (JARDIANCE) 10 MG TABS tablet Take 1 tablet (10 mg total) by mouth daily before breakfast. 30 tablet 11  . escitalopram (LEXAPRO) 10 MG tablet TAKE 1 TABLET BY MOUTH DAILY. 28 tablet 5  . ferrous sulfate 325 (65 FE) MG tablet Take 325 mg by mouth 2 (two) times daily with a meal.     . latanoprost (XALATAN) 0.005 % ophthalmic solution     . Magnesium 500 MG CAPS Take 500 mg by mouth daily.     . Melatonin 5 MG TABS Take 5 mg by mouth at bedtime.    . metoprolol succinate (TOPROL-XL) 25 MG 24 hr tablet TAKE 1/2 TABLET IN THE EVENING WITH OR IMMEDIATELY FOLLOWING A MEAL 30 tablet 1  . montelukast (SINGULAIR) 10 MG tablet TAKE ONE TABLET AT BEDTIME. 28 tablet 5  . Multiple Vitamins-Minerals (CENTRUM SILVER PO) Take 1 tablet by mouth daily.    Vladimir Faster Glycol-Propyl Glycol 0.4-0.3 % SOLN Place 1 drop into both eyes daily as needed (for dry eyes).     . potassium chloride SA (KLOR-CON) 20 MEQ tablet TAKE 1 TABLET ONCE DAILY. 60 tablet 1  . rosuvastatin (CRESTOR) 40 MG tablet TAKE 1 TABLET ONCE DAILY. 60 tablet 1  . senna (SENOKOT) 8.6 MG tablet Take 1 tablet by mouth 2 (two) times daily.    . SYMBICORT 160-4.5 MCG/ACT inhaler USE 2 PUFFS TWICE DAILY. 10.2 g 11  . furosemide (LASIX) 20 MG tablet Take 2 tablets (40 mg total) by mouth every morning AND 1 tablet (20 mg total) every evening. 90 tablet 11  . losartan (COZAAR) 25 MG tablet Take 1 tablet (25 mg total) by mouth daily. 90 tablet 3   No current facility-administered medications for this encounter.   Wt Readings from Last 3 Encounters:  12/07/20 93.1 kg (205 lb 3.2 oz)  11/16/20 92.8 kg (204 lb 9.6 oz)  11/10/20 93.1 kg (205 lb 3.2 oz)    BP 130/62   Pulse (!) 55   Wt 93.1 kg (205 lb 3.2 oz)   SpO2 95%   BMI 26.71 kg/m    General: NAD Neck: JVP  8-9 cm with HJR, no thyromegaly or thyroid nodule.  Lungs: Clear to  auscultation bilaterally with normal respiratory effort. CV: Nondisplaced PMI.  Heart regular S1/S2, no S3/S4, no murmur.  1+ edema to knees.  No carotid bruit.  Difficult to palpate pedal pulses.  Abdomen: Soft, nontender, no hepatosplenomegaly, no distention.  Skin: Intact without lesions or rashes.  Neurologic: Alert and oriented x 3.  Psych: Normal affect. Extremities: No clubbing or cyanosis.  HEENT: Normal.   Assessment/Plan: 1. Chronic diastolic CHF: Echo in 6/38 with EF 60-65%, normal RV.  NYHA class III symptoms, he is volume overloaded on exam.  - Increase Lasix to 40 mg bid x 4 days then 40 qam/20 qpm.  - Add Jardiance 10 mg daily.  - BMET today and again in 10 days.   2. Hyperlipidemia: Continue Crestor.   - Check lipids today.  3. Carotid stenosis: s/p R CEA.  Dopplers followed at [facility].  4. PAD: s/p patch angioplasty to left fem-pop bypass in 11/16.  Followed at [facility].  Moderately decreased ABI on right in 2/21, but no pain or pedal ulcerations.  Stable mild claudication.  - He is due for followup at [facility], needs appt.  5. AAA: Stable on last [name], followed at [facility].    6. COPD: No longer smoking. PFTs in 1/18 showed moderately severe obstructive defect. CT chest in 11/18 showed emphysema.   7. Atrial fibrillation: S/p atrial fibrillation ablation in 7/20 and redo in 5/21.  He has been primarily in NSR since redo ablation.  He is in NSR today.  - Continue Toprol XL 12.5 mg daily.  - Continue Eliquis 5 mg bid.  - Continue to limit ETOH.  - Continue to use CPAP.  8. CKD: Stage 3.   - BMET today.  - Will cut back on losartan to 25 mg daily with elevated creatinine recently.  9. CAD: LHC in 11/18 with nonobstructive disease only.  Cardiolite in 6/21 with no ischemia/infarction. No chest pain.  10. 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  He is no longer on methimazole, follows with endocrinology.     11. Colon cancer: Sigmoid adenocarcinoma, s/p surgical resection.  In remission.  12. HTN: Stable.   13. Pleural effusion: Chronic right pleural effusion, s/p right lower lobectomy.      Followup 3 wks with NP/PA  [name] 12/08/2020

## 2020-12-15 LAB — CUP PACEART REMOTE DEVICE CHECK
Date Time Interrogation Session: 20220316003105
Implantable Pulse Generator Implant Date: 20200709

## 2020-12-19 ENCOUNTER — Ambulatory Visit (HOSPITAL_COMMUNITY)
Admission: RE | Admit: 2020-12-19 | Discharge: 2020-12-19 | Disposition: A | Discharge status: Home / Self Care | Payer: PPO | Source: Ambulatory Visit | Attending: Cardiology | Admitting: Cardiology

## 2020-12-19 ENCOUNTER — Ambulatory Visit (INDEPENDENT_AMBULATORY_CARE_PROVIDER_SITE_OTHER): Payer: PPO

## 2020-12-19 ENCOUNTER — Other Ambulatory Visit: Payer: Self-pay

## 2020-12-19 DIAGNOSIS — I4891 Unspecified atrial fibrillation: Secondary | ICD-10-CM | POA: Diagnosis not present

## 2020-12-19 DIAGNOSIS — I5032 Chronic diastolic (congestive) heart failure: Secondary | ICD-10-CM | POA: Insufficient documentation

## 2020-12-19 LAB — BASIC METABOLIC PANEL
Anion gap: 8 (ref 5–15)
BUN: 20 mg/dL (ref 8–23)
CO2: 24 mmol/L (ref 22–32)
Calcium: 8.9 mg/dL (ref 8.9–10.3)
Chloride: 104 mmol/L (ref 98–111)
Creatinine, Ser: 1.56 mg/dL — ABNORMAL HIGH (ref 0.61–1.24)
GFR, Estimated: 45 mL/min — ABNORMAL LOW (ref >60)
Glucose, Bld: 175 mg/dL — ABNORMAL HIGH (ref 70–99)
Potassium: 4 mmol/L (ref 3.5–5.1)
Sodium: 136 mmol/L (ref 135–145)

## 2020-12-26 ENCOUNTER — Telehealth: Payer: Self-pay | Admitting: Oncology

## 2020-12-26 NOTE — Telephone Encounter (Signed)
I mailed updated appointment calendar to patient w/ change of location  

## 2020-12-27 NOTE — Progress Notes (Signed)
Carelink Summary Report / Loop Recorder 

## 2020-12-29 ENCOUNTER — Encounter (HOSPITAL_COMMUNITY): Payer: Self-pay

## 2020-12-29 ENCOUNTER — Ambulatory Visit (HOSPITAL_COMMUNITY)
Admission: RE | Admit: 2020-12-29 | Discharge: 2020-12-29 | Disposition: A | Discharge status: Home / Self Care | Payer: PPO | Source: Ambulatory Visit | Attending: Cardiology | Admitting: Cardiology

## 2020-12-29 ENCOUNTER — Other Ambulatory Visit: Payer: Self-pay

## 2020-12-29 VITALS — BP 140/78 | HR 57 | Wt 201.0 lb

## 2020-12-29 DIAGNOSIS — Z79899 Other long term (current) drug therapy: Secondary | ICD-10-CM | POA: Diagnosis not present

## 2020-12-29 DIAGNOSIS — I5032 Chronic diastolic (congestive) heart failure: Secondary | ICD-10-CM | POA: Diagnosis not present

## 2020-12-29 DIAGNOSIS — Z85038 Personal history of other malignant neoplasm of large intestine: Secondary | ICD-10-CM | POA: Diagnosis not present

## 2020-12-29 DIAGNOSIS — J90 Pleural effusion, not elsewhere classified: Secondary | ICD-10-CM | POA: Insufficient documentation

## 2020-12-29 DIAGNOSIS — Z85118 Personal history of other malignant neoplasm of bronchus and lung: Secondary | ICD-10-CM | POA: Diagnosis not present

## 2020-12-29 DIAGNOSIS — I130 Hypertensive heart and chronic kidney disease with heart failure and stage 1 through stage 4 chronic kidney disease, or unspecified chronic kidney disease: Secondary | ICD-10-CM | POA: Insufficient documentation

## 2020-12-29 DIAGNOSIS — J439 Emphysema, unspecified: Secondary | ICD-10-CM | POA: Diagnosis not present

## 2020-12-29 DIAGNOSIS — Z87891 Personal history of nicotine dependence: Secondary | ICD-10-CM | POA: Diagnosis not present

## 2020-12-29 DIAGNOSIS — Z7901 Long term (current) use of anticoagulants: Secondary | ICD-10-CM | POA: Diagnosis not present

## 2020-12-29 DIAGNOSIS — N183 Chronic kidney disease, stage 3 unspecified: Secondary | ICD-10-CM | POA: Insufficient documentation

## 2020-12-29 DIAGNOSIS — I480 Paroxysmal atrial fibrillation: Secondary | ICD-10-CM | POA: Insufficient documentation

## 2020-12-29 DIAGNOSIS — I714 Abdominal aortic aneurysm, without rupture: Secondary | ICD-10-CM | POA: Insufficient documentation

## 2020-12-29 DIAGNOSIS — I2510 Atherosclerotic heart disease of native coronary artery without angina pectoris: Secondary | ICD-10-CM | POA: Insufficient documentation

## 2020-12-29 DIAGNOSIS — I739 Peripheral vascular disease, unspecified: Secondary | ICD-10-CM | POA: Diagnosis not present

## 2020-12-29 DIAGNOSIS — E785 Hyperlipidemia, unspecified: Secondary | ICD-10-CM | POA: Diagnosis not present

## 2020-12-29 LAB — BASIC METABOLIC PANEL
Anion gap: 8 (ref 5–15)
BUN: 22 mg/dL (ref 8–23)
CO2: 25 mmol/L (ref 22–32)
Calcium: 9.2 mg/dL (ref 8.9–10.3)
Chloride: 104 mmol/L (ref 98–111)
Creatinine, Ser: 1.64 mg/dL — ABNORMAL HIGH (ref 0.61–1.24)
GFR, Estimated: 42 mL/min — ABNORMAL LOW (ref >60)
Glucose, Bld: 122 mg/dL — ABNORMAL HIGH (ref 70–99)
Potassium: 4.4 mmol/L (ref 3.5–5.1)
Sodium: 137 mmol/L (ref 135–145)

## 2020-12-29 LAB — BRAIN NATRIURETIC PEPTIDE: B Natriuretic Peptide: 206.4 pg/mL — ABNORMAL HIGH (ref 0.0–100.0)

## 2020-12-29 MED ORDER — FUROSEMIDE 20 MG PO TABS: 40.0000 mg | ORAL_TABLET | Freq: Two times a day (BID) | ORAL | 11 refills | Status: DC

## 2020-12-29 NOTE — Progress Notes (Addendum)
Patient ID: Troy Ackert [name], male   DOB: August 14, 1941, 80 y.o.   MRN: [id number] PCP: Dr. Quay Burow Cardiology: [name]  80 y.o. with history of HTN, COPD, active smoking/COPD, carotid stenosis s/p right CEA, paroxysmal atrial fibrillation, and PAD.   He does not have known obstructive CAD but is at high risk for CAD based on his comorbidities.  Lexiscan Cardiolite in 3/14 showed no ischemia or infarction and echo in 3/14 showed normal EF.  He had a left fem-pop bypass at the [facility] in the '90s.  He is followed at [facility] for PAD. He has chronic right calf/thigh/buttocks claudication that is unchanged over the last few years and follows regularly at [facility].  He has had right lower lobectomy for lung cancer.  He continues to stay off cigarettes.    At a prior appointment, he was in atrial fibrillation but did not realize it. Dr Aundra Dubin  started him on Eliquis and diltiazem CD, and he spontaneously converted to NSR.  In 5/15, he was at [facility] and felt "strange" one day: fatigued, weak, short of breath.  He went to the ER and was in atrial fibrillation with HR in 80s-90s.  He spontaneously converted to NSR in the ER.  He felt back to normal after converting to NSR.  Started on Multaq 400 mg bid.  With CHF, he was eventually transitioned over to amiodarone.   He had patch angioplasty revision of left fem-pop bypass in 11/16.  Now with minimal claudication.  He follows with [facility].     He had a Cardiolite and echo in 7/17 that were unremarkable.  Repeat Cardiolite in 12/17 showed no ischemia, echo was uninterpretable.  Cardiac MRI was therefore done in 1/18, showing EF 66% with normal-appearing RV, no late gadolinium enhancement.   Given increased exertional dyspnea and a defect on Cardiolite, he had LHC in 11/18.  This showed no obstructive CAD. He had a chest CT done given concern for possible amiodarone lung toxicity with increased dyspnea. This showed emphysema, no ILD.    In 5/19, he developed a  probable viral syndrome with dehydration and had a presyncopal episode.  He was orthostatic in the hospital.  He was noted to be in atrial fibrillation this admission which remained persistent.  He had a TEE-guided DCCV back to NSR.   In 9/19, he was admitted with GI bleeding and found to have a sigmoid mass on colonoscopy, path showed sigmoid adenocarcinoma.  He was noted to have right paratracheal lymphadenopathy but EUS with biopsy in 10/19 did not show malignancy.  He had surgical resection of colon cancer.   Amiodarone was stopped with elevated ESR and increased dyspnea.  He was also found to have hyperthyroidism. He was treated with methimazole by endocrinology.    He had atrial fibrillation ablation in 7/20.   Echo in 4/21 showed EF 60-65%, normal RV.    In 5/21, due to high atrial fibrillation burden, he had redo atrial fibrillation ablation.   Cardiolite in 6/21 showed EF 70%, no ischemia/infarction.   Seen by [name] earlier this month for routine f/u and endorsed increased exertional dyspnea and was found to be fluid overloaded on exam. Jardiance was added. Lasix was increased to 40 mg bid x 4 days then back down to 40 qam/ 20 qpm. Losartan was also reduced down to 25 mg daily due to renal function.   He presents back for f/u. He reports he felt extremely well the 4 days he took  40 of Lasix BID. Dyspnea improved and wt came down but felt he took a step backwards when he went back to 40/20 mg dosage. Continues w/ exertional dyspnea. He monitors sodium intake but not fluid intake. Stays thirsty and drinks lots of water.   ECG (personally reviewed): sinus bradycardia 59 bpm   Labs (3/13): LDL 75, HDL 35, K 4.1, creatinine 1.1 Labs (3/14): LDL 91, HDL 27 Labs (10/14): K 4.3, creatinine 0.98 Labs (4/15): K 4.9, creatinine 1.1, LDL 76, HDL 30 Labs (5/15): K 4.3, creatinine 1.7, BNP 251 Labs (6/15): K 4.4, creatinine 1.3 Labs (10/15): TSH normal Labs (5/16): K 4.2, creatinine 1.12,  HCT 39.5, LFTs normal, LDL 84, HDL 43, TSH normal Labs (11/16): creatinine 0.94 Labs (4/17): LDL 39, HDL 39 Labs (6/17): K 4.5, creatinine 1.2 Labs (9/17): K 4, creatinine 1.25, HCT 38.7 Labs (12/17): K 4.5, creatinine 1.49 => 1.62, BNP 43 Labs (2/18): K 3.8, creatinine 1.33, LFTs normal, TSH normal Labs (6/18): K 4.3, creatinine 1.49, LFTs normal, TSH normal, hgb 12.3 Labs (11/18): ESR 60, TSH normal, LFTs normal, creatinine 1.34 Labs (1/19): LDL 50 Labs (5/19): K 4.6, creatinine 1.55, LFTs normal, hgb 12.7, LFTs normal Labs (10/19): K 4 => 4.1, creatinine 1.39 => 1.43, BNP 279, hgb 10.7, TSH low, free T4 and free T3 low, ESR 108 Labs (3/21): K 4.5, creatinine 1.19, TSH normal Labs (4/21): K 4.8, creatinine 1.28, BNP 524, LDL 59 Labs (7/21): hgb 11.5, creatinine 1.4 Labs (8/21): K 4.3, creatinine 1.09 Labs (9/21): hgb 12.7 Labs (2/22): K 5, creatinine 1.54 Labs (3/22): K 4.0, creatinine 1.56  PMH: 1. HTN: Fatigue and cough with ramipril use.  2. COPD: Quit smoking 2014. PFTs (1/18) with moderately severe COPD.  - CT chest (11/18): Emphysema noted, no ILD.  3. AAA: CT 1/13 with 3.0 cm AAA.  Abdominal US (1/14) with 3.4 cm AAA. Abdominal US (4/15) with 3.25 x 3.27 AAA. Abdominal US (3/16) with 3.7 cm AAA.  Followed at [facility].  - Abd [name] (1/19): 3.3 cm AAA. 4. Squamous cell lung cancer diagnosed 2/12.  Had right lower lobectomy in 4/12.  5. Hyperlipidemia 6. PAD: Left fem-pop bypass 1994.  ABIs (2/12) 0.62 on right, 0.95 on left. ABIs (1/14): 0.65 on right, 1.04 on left. ABIs (4/15) 0.66 right, 0.87 left.  ABIs (3/16) 0.6 right, 0.88 left.  Patch angioplasty left fem-pop bypass in 11/16.   - Left fem-pop bypass patent on doppler evaluation in 12/17.  - ABIs (2/21): normal on left (1.02), moderately decreased on right (0.54).  7. CAD: Stress myoview 2004 was normal. Lexiscan Cardiolite (3/14) with EF 66%, no ischemia or infarction. Lexiscan Cardiolite (7/17) with EF 61%, no  ischemia/infarction.  - Cardiolite (12/17) with EF 62%, inferior/inferolateral fixed defect, most likely diaphragmatic attenuation, no ischemia.  - Cardiolite (9/18) with EF > 65%, fixed inferior defect (attenuation versus infarction), no ischemia.  - LHC (11/18): No obstructive CAD.  - Cardiolite (6/21): EF 70%, no ischemia/infarction 8. Chronic diastolic CHF: Echo (7/94): technically difficult with EF 55-60%, upper normal RV size.  Echo (5/16) with EF 60-65%.  Echo (7/17) with EF 55-60%, normal RV size and systolic function.  - Echo 12/17 with very poor windows, unable to comment on LV or RV function.  - Cardiac MRI (1/18) with EF 66%, normal RV size and systolic function.  - TEE (5/19): EF 55-60%, normal RV size and systolic function.  - Echo (4/21): EF 60-65%, normal RV 9. Carotid stenosis: TIA 10/14.  Carotid dopplers  with > 06% RICA, 30-16% LICA.  Patient had right CEA in 10/14. Carotids (4/15) patent right CEA, LICA 01-09% stenosis.  Carotids (32/35) < 57% LICA, patent right CEA.  - Carotid dopplers (12/17): right CEA ok, < 32% LICA stenosis.  - Carotid dopplers (1/19): Right CEA ok, 2-02% LICA stenosis.  - Carotid dopplers (2/21): Right CEA ok, LICA 54-27% stenosis.  10. Atrial fibrillation: Paroxysmal. First noted after lobectomy in 4/12 (brief), recurrence in 4/15 then in 5/15.  - TEE-guided DCCV for persistent atrial fibrillation in 5/19.  - Atrial fibrillation ablation 7/20.  - Re-do atrial fibrillation ablation 5/21.  11. Spinal stenosis 12. OSA: Using CPAP.  13. Glaucoma 14. Has ILR 15. Colon cancer: Sigmoid adenocarcinoma, s/p surgical resection.  16. 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   SH: Married, lives in [place].  Former [facility].  Quit smoking in 2014.  Rare ETOH now.   FH: No premature CAD  ROS: All systems reviewed and negative except as per HPI.   Current Outpatient Medications  Medication Sig Dispense Refill  . amLODipine  (NORVASC) 5 MG tablet TAKE 1 TABLET ONCE DAILY. 60 tablet 1  . Ascorbic Acid (VITAMIN C PO) Take 500 mg by mouth in the morning and at bedtime.     . Biotin 2500 MCG CAPS Take 2,500 mcg by mouth daily.    . Brinzolamide-Brimonidine (SIMBRINZA) 1-0.2 % SUSP Place 1 drop into both eyes 2 times daily.    [name] docusate sodium (COLACE) 100 MG capsule Take 100 mg by mouth 2 (two) times daily.     [name] ELIQUIS 5 MG TABS tablet TAKE 1 TABLET BY MOUTH TWICE DAILY. 120 tablet 1  . empagliflozin (JARDIANCE) 10 MG TABS tablet Take 1 tablet (10 mg total) by mouth daily before breakfast. 30 tablet 11  . escitalopram (LEXAPRO) 10 MG tablet TAKE 1 TABLET BY MOUTH DAILY. 28 tablet 5  . ferrous sulfate 325 (65 FE) MG tablet Take 325 mg by mouth 2 (two) times daily with a meal.     . latanoprost (XALATAN) 0.005 % ophthalmic solution     . losartan (COZAAR) 25 MG tablet Take 1 tablet (25 mg total) by mouth daily. 90 tablet 3  . Magnesium 500 MG CAPS Take 500 mg by mouth daily.     . Melatonin 5 MG TABS Take 5 mg by mouth at bedtime.    . metoprolol succinate (TOPROL-XL) 25 MG 24 hr tablet TAKE 1/2 TABLET IN THE EVENING WITH OR IMMEDIATELY FOLLOWING A MEAL 30 tablet 1  . montelukast (SINGULAIR) 10 MG tablet TAKE ONE TABLET AT BEDTIME. 28 tablet 5  . Multiple Vitamins-Minerals (CENTRUM SILVER PO) Take 1 tablet by mouth daily.    Vladimir Faster Glycol-Propyl Glycol 0.4-0.3 % SOLN Place 1 drop into both eyes daily as needed (for dry eyes).     . potassium chloride SA (KLOR-CON) 20 MEQ tablet TAKE 1 TABLET ONCE DAILY. 60 tablet 1  . rosuvastatin (CRESTOR) 40 MG tablet TAKE 1 TABLET ONCE DAILY. 60 tablet 1  . senna (SENOKOT) 8.6 MG tablet Take 1 tablet by mouth 2 (two) times daily.    . SYMBICORT 160-4.5 MCG/ACT inhaler USE 2 PUFFS TWICE DAILY. 10.2 g 11  . furosemide (LASIX) 20 MG tablet Take 2 tablets (40 mg total) by mouth 2 (two) times daily. 120 tablet 11  . omeprazole (PRILOSEC) 20 MG capsule Take 20 mg by mouth 2 (two)  times daily.     No current facility-administered  medications for this encounter.   Wt Readings from Last 3 Encounters:  12/29/20 91.2 kg (201 lb)  12/07/20 93.1 kg (205 lb 3.2 oz)  11/16/20 92.8 kg (204 lb 9.6 oz)    BP 140/78   Pulse (!) 57   Wt 91.2 kg (201 lb)   SpO2 94%   BMI 26.16 kg/m    PHYSICAL EXAM:  ReDs Clip 47%  General:  Well appearing. No respiratory difficulty HEENT: normal Neck: supple. JVD 8 cm. Carotids 2+ bilat; no bruits. No lymphadenopathy or thyromegaly appreciated. Cor: PMI nondisplaced. Regular rate & rhythm. No rubs, gallops or murmurs. Lungs: clear Abdomen: soft, nontender, nondistended. No hepatosplenomegaly. No bruits or masses. Good bowel sounds. Extremities: no cyanosis, clubbing, rash, edema Neuro: alert & oriented x 3, cranial nerves grossly intact. moves all 4 extremities w/o difficulty. Affect pleasant.    Assessment/Plan: 1. Chronic diastolic CHF: Echo in 1/79 with EF 60-65%, normal RV.   - NYHA class III symptoms. Remains fluid overloaded. ReDs Clip 47%  - Increase Lasix to 40 mg bid  - Continue Jardiance 10 mg daily.  - Check BMP today and again in 7 days  2. Hyperlipidemia: Continue Crestor.   - Lipids at goal. Lipid panel 12/07/20 LDL 31 mg/dL. HFTs normal  3. Carotid stenosis: s/p R CEA.  Dopplers followed at [facility].  4. PAD: s/p patch angioplasty to left fem-pop bypass in 11/16.  Followed at [facility].  Moderately decreased ABI on right in 2/21, but no pain or pedal ulcerations.  Stable mild claudication.  - He is due for followup at [facility], needs appt.  5. AAA: Stable on last [name], followed at [facility].    6. COPD: No longer smoking. PFTs in 1/18 showed moderately severe obstructive defect. CT chest in 11/18 showed emphysema.   7. Atrial fibrillation: S/p atrial fibrillation ablation in 7/20 and redo in 5/21.  He has been primarily in NSR since redo ablation.  He is SB on EKG today, HR 59 bpm  - Continue Toprol XL 12.5 mg daily.  - Continue Eliquis 5  mg bid.  - Continue to limit ETOH.   - Continue to use CPAP.  8. CKD: Stage 3.   - BMP today and again in 7 days w/ Lasix increase .  9. CAD: LHC in 11/18 with nonobstructive disease only.  Cardiolite in 6/21 with no ischemia/infarction. No chest pain.  10. 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  He is no longer on methimazole, follows with endocrinology.     11. Colon cancer: Sigmoid adenocarcinoma, s/p surgical resection. In remission.  12. HTN: Controlled on current regimen.   13. Pleural effusion: Chronic right pleural effusion, s/p right lower lobectomy.      F/u BMP in 1 week. Provider f/u in 3-4 weeks   Lyda Jester, [place]  12/29/2020

## 2020-12-29 NOTE — Patient Instructions (Signed)
EKG done today.   Labs done today. We will contact you only if your labs are abnormal.  INCREASE Lasix to 40mg  (2 tablets) by mouth 2 times daily.  No other medication changes were made. Please continue all current medications as prescribed.  Your physician recommends that you schedule a follow-up appointment in: 1 week for a nurse visit and in 6-8 weeks with [name].  If you have any questions or concerns before your next appointment please send [name] a message through [place] or call our office at [phone].    TO LEAVE A MESSAGE FOR THE NURSE SELECT OPTION 2, PLEASE LEAVE A MESSAGE INCLUDING: . YOUR NAME . DATE OF BIRTH . CALL BACK NUMBER . REASON FOR CALL**this is important as we prioritize the call backs  YOU WILL RECEIVE A CALL BACK THE SAME DAY AS LONG AS YOU CALL BEFORE 4:00 PM   Do the following things EVERYDAY: 1) Weigh yourself in the morning before breakfast. Write it down and keep it in a log. 2) Take your medicines as prescribed 3) Eat low salt foods--Limit salt (sodium) to 2000 mg per day.  4) Stay as active as you can everyday 5) Limit all fluids for the day to less than 2 liters   At the [facility], you and your health needs are our priority. As part of our continuing mission to provide you with exceptional heart care, we have created designated Provider Care Teams. These Care Teams include your primary Cardiologist (physician) and Advanced Practice Providers (APPs- Physician Assistants and Nurse Practitioners) who all work together to provide you with the care you need, when you need it.   You may see any of the following providers on your designated Care Team at your next follow up: [name] Dr Glori Bickers . Dr [name] . Darrick Grinder, NP . Lyda Jester, PA . Audry Riles, PharmD   Please be sure to bring in all your medications bottles to every appointment.

## 2020-12-29 NOTE — Progress Notes (Signed)
ReDS Vest / Clip - 12/29/20 1400      ReDS Vest / Clip   Station Marker C    Ruler Value 30    ReDS Value Range High volume overload    ReDS Actual Value 47    Anatomical Comments sitting

## 2021-01-05 ENCOUNTER — Other Ambulatory Visit: Payer: Self-pay

## 2021-01-05 ENCOUNTER — Ambulatory Visit (HOSPITAL_COMMUNITY)
Admission: RE | Admit: 2021-01-05 | Discharge: 2021-01-05 | Disposition: A | Discharge status: Home / Self Care | Payer: PPO | Source: Ambulatory Visit | Attending: Internal Medicine | Admitting: Internal Medicine

## 2021-01-05 DIAGNOSIS — I5032 Chronic diastolic (congestive) heart failure: Secondary | ICD-10-CM | POA: Diagnosis not present

## 2021-01-05 LAB — BASIC METABOLIC PANEL
Anion gap: 8 (ref 5–15)
BUN: 23 mg/dL (ref 8–23)
CO2: 24 mmol/L (ref 22–32)
Calcium: 9.1 mg/dL (ref 8.9–10.3)
Chloride: 107 mmol/L (ref 98–111)
Creatinine, Ser: 1.73 mg/dL — ABNORMAL HIGH (ref 0.61–1.24)
GFR, Estimated: 39 mL/min — ABNORMAL LOW (ref >60)
Glucose, Bld: 117 mg/dL — ABNORMAL HIGH (ref 70–99)
Potassium: 4.1 mmol/L (ref 3.5–5.1)
Sodium: 139 mmol/L (ref 135–145)

## 2021-01-05 LAB — BRAIN NATRIURETIC PEPTIDE: B Natriuretic Peptide: 188.6 pg/mL — ABNORMAL HIGH (ref 0.0–100.0)

## 2021-01-06 ENCOUNTER — Telehealth (HOSPITAL_COMMUNITY): Payer: Self-pay | Admitting: *Deleted

## 2021-01-06 ENCOUNTER — Encounter (HOSPITAL_COMMUNITY): Payer: Self-pay | Admitting: *Deleted

## 2021-01-06 NOTE — Telephone Encounter (Signed)
-----   Message from Consuelo Pandy, [place] sent at 01/05/2021  3:31 PM EDT ----- Can you please let him know that his renal function and K are both stable w/ diuretic increase. He was worried about this. Thanks.

## 2021-01-12 ENCOUNTER — Other Ambulatory Visit: Payer: Self-pay | Admitting: Gastroenterology

## 2021-01-18 ENCOUNTER — Telehealth: Payer: Self-pay

## 2021-01-18 LAB — CUP PACEART REMOTE DEVICE CHECK
Date Time Interrogation Session: 20220418010217
Implantable Pulse Generator Implant Date: 20200709

## 2021-01-18 NOTE — Progress Notes (Signed)
Chronic Care Management Pharmacy Assistant   Name: Troy Gurganus [name]  MRN: [id number] DOB: 02/25/1941   Reason for Encounter: Initial Questions Appointment: OV 01/19/21 @ 2 pm   Recent office visits:  08/02/20 Burns (PCP) - Patient was seen for Aortic atherosclerosis , depression & HTN.Patient was instructed to discontinued Fluoxetine, and f/u in 6 months.   Recent consult visits:  08/05/20 [facility]rill (Oncology) - Adenocarcinoma of sigmoid colon. Patient currently in remission and asked to f/u in 6 mo for CT.  08/09/20 [facility] (Cardiology) - Patient was seen CHF and atrial fibrillation. Provider stated he is stable symptomatically, moving slowly with a somewhat shuffling gait. He is no longer on methimazole, follows with endocrinology. Patient instructed to f/u in 4 months.    09/21/20 Bond (Opthamology) - Glaucoma - f/u in 2 mo., Patient instructed to discontinue XALATAN, increase SIMBRINZA 3x day)   10/18/20 Mansouraty (Gastro) - Colon Cancer. Patient instructed to decrease Omeprazole from 40 to 20 mg daily  11/16/20 [place],FNP (Cardiology) - CHF. Patient medication was increase to lasix 80 mg 2x day for two days then back to 40 mg. Patient instructed to keep a weight log.  12/05/20 Bond ( Opthalmology) - continue current plan, f/u 3 mo  12/29/20 Simmons,PA-C (Cardiology) - continue current regimen, f/u BMP 1 week, f/u 3 mo provider  Hospital visits:  None in previous 6 months  Medications: Outpatient Encounter Medications as of 01/18/2021  Medication Sig  . amLODipine (NORVASC) 5 MG tablet TAKE 1 TABLET ONCE DAILY.  [name] Ascorbic Acid (VITAMIN C PO) Take 500 mg by mouth in the morning and at bedtime.   . Biotin 2500 MCG CAPS Take 2,500 mcg by mouth daily.  . Brinzolamide-Brimonidine (SIMBRINZA) 1-0.2 % SUSP Place 1 drop into both eyes 2 times daily.  [name] docusate sodium (COLACE) 100 MG capsule Take 100 mg by mouth 2 (two) times daily.   [name] ELIQUIS 5 MG TABS tablet TAKE 1 TABLET BY  MOUTH TWICE DAILY.  [name] empagliflozin (JARDIANCE) 10 MG TABS tablet Take 1 tablet (10 mg total) by mouth daily before breakfast.  . escitalopram (LEXAPRO) 10 MG tablet TAKE 1 TABLET BY MOUTH DAILY.  . ferrous sulfate 325 (65 FE) MG tablet Take 325 mg by mouth 2 (two) times daily with a meal.   . furosemide (LASIX) 20 MG tablet Take 2 tablets (40 mg total) by mouth 2 (two) times daily.  [name] latanoprost (XALATAN) 0.005 % ophthalmic solution   . losartan (COZAAR) 25 MG tablet Take 1 tablet (25 mg total) by mouth daily.  . Magnesium 500 MG CAPS Take 500 mg by mouth daily.   . Melatonin 5 MG TABS Take 5 mg by mouth at bedtime.  . metoprolol succinate (TOPROL-XL) 25 MG 24 hr tablet TAKE 1/2 TABLET IN THE EVENING WITH OR IMMEDIATELY FOLLOWING A MEAL  . montelukast (SINGULAIR) 10 MG tablet TAKE ONE TABLET AT BEDTIME.  . Multiple Vitamins-Minerals (CENTRUM SILVER PO) Take 1 tablet by mouth daily.  [name] omeprazole (PRILOSEC) 20 MG capsule TAKE (1) CAPSULE TWICE DAILY.  Vladimir Faster Glycol-Propyl Glycol 0.4-0.3 % SOLN Place 1 drop into both eyes daily as needed (for dry eyes).   . potassium chloride SA (KLOR-CON) 20 MEQ tablet TAKE 1 TABLET ONCE DAILY.  . rosuvastatin (CRESTOR) 40 MG tablet TAKE 1 TABLET ONCE DAILY.  [name] senna (SENOKOT) 8.6 MG tablet Take 1 tablet by mouth 2 (two) times daily.  . SYMBICORT 160-4.5 MCG/ACT inhaler USE 2 PUFFS TWICE DAILY.  No facility-administered encounter medications on file as of 01/18/2021.    Have you seen any other providers since your last visit?  Patient stated he seen Dr.McClean his cardiologists off and on.  Any changes in your medications or health?  Patient stated [name] started him on Jardiance 10 mg.  Any side effects from any medications?  Patient states no side effects at this time.  Do you have an symptoms or problems not managed by your medications?  Patient states no problems at this time.  Any concerns about your health right now?  Patient states  no concerns at this time.  Has your provider asked that you check blood pressure, blood sugar, or follow special diet at home? Patient states he doesn't check his BP at home.Patient also states he doesn't eat salt or fried foods.  Do you get any type of exercise on a regular basis?  Patient states he work out 3x a week with a Physiological scientist.  Can you think of a goal you would like to reach for your health?  Patient states he doesn't have a goal at this time.  Do you have any problems getting your medications?  Patient states he has no problems getting meds.  Is there anything that you would like to discuss during the appointment?  Patient want to make sure that with all the medication he takes one don't interfere with the other.  Please bring medications and supplements to appointment  Star Rating Drugs: Losartan - last fill 12/25/20 28D Rosuvastatin - last fill 12/25/20 [place], RMA Clinical Pharmacists Assistant [phone]  Time Spent: [phone]

## 2021-01-19 ENCOUNTER — Other Ambulatory Visit: Payer: Self-pay

## 2021-01-19 ENCOUNTER — Ambulatory Visit (INDEPENDENT_AMBULATORY_CARE_PROVIDER_SITE_OTHER): Payer: PPO | Admitting: Pharmacist

## 2021-01-19 DIAGNOSIS — N1832 Chronic kidney disease, stage 3b: Secondary | ICD-10-CM

## 2021-01-19 DIAGNOSIS — I480 Paroxysmal atrial fibrillation: Secondary | ICD-10-CM | POA: Diagnosis not present

## 2021-01-19 DIAGNOSIS — J449 Chronic obstructive pulmonary disease, unspecified: Secondary | ICD-10-CM | POA: Diagnosis not present

## 2021-01-19 DIAGNOSIS — E782 Mixed hyperlipidemia: Secondary | ICD-10-CM | POA: Diagnosis not present

## 2021-01-19 DIAGNOSIS — I10 Essential (primary) hypertension: Secondary | ICD-10-CM | POA: Diagnosis not present

## 2021-01-19 DIAGNOSIS — I5032 Chronic diastolic (congestive) heart failure: Secondary | ICD-10-CM | POA: Diagnosis not present

## 2021-01-19 DIAGNOSIS — F3289 Other specified depressive episodes: Secondary | ICD-10-CM

## 2021-01-19 DIAGNOSIS — I700 Atherosclerosis of aorta: Secondary | ICD-10-CM

## 2021-01-19 NOTE — Patient Instructions (Addendum)
Thank you for meeting with me to discuss your medications! I look forward to working with you to achieve your health care goals. Below is a summary of what we talked about during the visit:   Make sure omeprazole 20 mg is only once a day in the pill box (there are 2 bottles full of omeprazole and it is also in the pill packaging from [facility])  Try Align Probiotic (or store brand) instead of gummies (gummies have extra sugar, corn syrup and gelatin that can worsen constipation).   Visit Information  Phone number for Pharmacist: [phone]  Goals Addressed            This Visit's Progress   . Manage My Medicine       Timeframe:  Long-Range Goal Priority:  Medium Start Date:         01/20/21                    Expected End Date:      07/30/21                 Follow Up Date 04/29/21   - call for medicine refill 2 or 3 days before it runs out - call if I am sick and can't take my medicine - keep a list of all the medicines I take; vitamins and herbals too - use a pillbox to sort medicine  -Switch to Align (or similar) probiotic   Why is this important?   . These steps will help you keep on track with your medicines.   Notes:       Patient Care Plan: CCM Pharmacy Care Plan    Problem Identified: Hypertension, Hyperlipidemia, Atrial Fibrillation, Heart Failure, Coronary Artery Disease, GERD, COPD, Chronic Kidney Disease and Depression   Priority: High    Long-Range Goal: Disease management   Start Date: 01/20/2021  Expected End Date: 07/22/2021  This Visit's Progress: On track  Priority: High  Note:   Current Barriers:  . Unable to independently monitor therapeutic efficacy . Unable to maintain control of constipation  Pharmacist Clinical Goal(s):  [name] Patient will achieve adherence to monitoring guidelines and medication adherence to achieve therapeutic efficacy through collaboration with PharmD and provider.   Interventions: . 1:1 collaboration with Binnie Rail,  MD regarding development and update of comprehensive plan of care as evidenced by provider attestation and co-signature . Inter-disciplinary care team collaboration (see longitudinal plan of care) . Comprehensive medication review performed; medication list updated in electronic medical record  Hyperlipidemia: (LDL goal < 70) -Hx TIA, PAD, CAD (aortic atherosclerosis) -Controlled - LDL is at goal, pt denies side effects -Current treatment: . Rosuvastatin 40 mg daily -Educated on Cholesterol goals;  Benefits of statin for ASCVD risk reduction; -Recommended to continue current medication  Atrial Fibrillation (Goal: prevent stroke and major bleeding) -Controlled - pt reports Afib episodes are rare since ablation; he denies bleeding issues -CHADSVASC: 6 -hx GI bleed 06/2018 related to colon cancer -Current treatment: . Rate control: Metoprolol succinate 25 mg - 1/2 tab daily . Anticoagulation: Eliquis 5 mg BID -Home BP and HR readings: not checking  -Counseled on increased risk of stroke due to Afib and benefits of anticoagulation for stroke prevention; importance of adherence to anticoagulant exactly as prescribed; bleeding risk associated with Eliquis and importance of self-monitoring for signs/symptoms of bleeding; -Recommended to continue current medication  Heart Failure / HTN (Goal: BP < 140/90, prevent exacerbations) -Relatively controlled - pt reports frequent urination  on furosemide, but swelling and shortness of breath are stable now -Last ejection fraction: 60-65% (Date: 01/13/2020) -HF type: Diastolic -NYHA Class: III (marked limitation of activity) -Current treatment: . Amlodipine 5 mg daily . Furosemide 20 mg - 2 tab BID . Losartan 25 mg daily . Metoprolol succinate 25 mg - 1/2 tab daily . Jardiance 10 mg daily . Klor-Con 20 mEq daily -Current dietary habits: eats out 3-4 nights per week; he does not add salt to food but eats fried foods, snacks often that may be high in  sodium -Current exercise habits: limited  -Educated on Benefits of medications for managing symptoms and prolonging life Proper diuretic administration and potassium supplementation  Importance of avoiding salt -Recommended to continue current medication  COPD (Goal: control symptoms and prevent exacerbations) -Controlled - pt reports breathing/SOB is currently under control -Gold Grade: Gold 2 (FEV1 50-79%) -COPD Classification:  A (low sx, <2 exacerbations/yr) -MMRC/CAT score: not on file -Pulmonary function testing: (10/04/2016) FEV1 62% predicted, FEV1/FVC 0.64 -Exacerbations requiring treatment in last 6 months: 0 -Current treatment  . Symbicort 160-4.5 mcg/act 2 puffs BID . Montelukast 10 mg HS -Patient reports consistent use of maintenance inhaler -Frequency of rescue inhaler use: n/a -Counseled on Benefits of consistent maintenance inhaler use -Recommended to continue current medication  Depression/Anxiety (Goal: manage symptoms) -Controlled - pt reports he has his ups and downs while still mourning his wife but overall mood is "good" -Current treatment: . Escitalopram 10 mg daily -Medications previously tried/failed: fluoxetine -PHQ9: 0 (08/2020) -GAD7: not on file -Connected with PCP for mental health support -Educated on Benefits of medication for symptom control -Recommended to continue current medication  GERD / hx GI bleed (Goal: manage symptoms) -Controlled - pt is not sure how often he is taking omeprazole; it is part of his pill packs and he also has 2 extra bottles; of note pt reports constipation is an issue -GI bleed 06/2018 related to colon cancer -Current treatment  . Omeprazole 20 mg BID . Famotidine 20 mg PRN (rarely used) -Discussed that extra bottles of omeprazole (filled 11/28/20 and 12/25/20) indicate that his caregivers are likely only giving it to him once a day -Counseled that omeprazole may be contributing to constipation -Recommended to take  omeprazole once a day - verify with caregivers that the are only putting 1 pill per day in his pill box  Glaucoma (Goal: maintain IOP in goal range) -Controlled - follows with Dr Edilia Bo regularly -Current treatment  . Simbrinza (brinzolamide-brimonidine) eye drops BID . Latanoprost 0.005% eye drops HS . Artificial Tears PRN -Patient is satisfied with current regimen and denies issues -Recommended to continue current medication  Health Maintenance -Vaccine gaps: TDAP, Shingrix -Current therapy:  . Docusate 100 mg BID . Senna 8.6 mg BID . Melatonin 10 mg HS . Multivitamin . Digestive Health gummies -Counseled on contents of digestive gummies - contains sugar, corn syrup and gelatin which may exacerbate constipation -Recommend to switch to Align Probiotic (or store brand)  Patient Goals/Self-Care Activities . Patient will:  - take medications as prescribed focus on medication adherence by pill box weigh daily, and contact provider if weight gain of 3+ lbs overnight engage in dietary modifications by minimizing salt  Switch to Align (or similar) probiotic  Follow Up Plan: Telephone follow up appointment with care management team member scheduled for: 3 months      Mr. Baumgardner was given information about Chronic Care Management services today including:  1. CCM service includes personalized support from designated  clinical staff supervised by his physician, including individualized plan of care and coordination with other care providers 2. 24/7 contact phone numbers for assistance for urgent and routine care needs. 3. Standard insurance, coinsurance, copays and deductibles apply for chronic care management only during months in which we provide at least 20 minutes of these services. Most insurances cover these services at 100%, however patients may be responsible for any copay, coinsurance and/or deductible if applicable. This service may help you avoid the need for more expensive  face-to-face services. 4. Only one practitioner may furnish and bill the service in a calendar month. 5. The patient may stop CCM services at any time (effective at the end of the month) by phone call to the office staff.  Patient agreed to services and verbal consent obtained.   Patient verbalizes understanding of instructions provided today and agrees to view in [place].  Telephone follow up appointment with pharmacy team member scheduled for: 3 months  Charlene Brooke, PharmD, BCACP, CPP Clinical Pharmacist [facility] [phone]   Cooking With Less [place] with less salt is one way to reduce the amount of sodium you get from food. Sodium is one of the elements that make up salt. It is found naturally in foods and is also added to certain foods. Depending on your condition and overall health, your health care provider or dietitian may recommend that you reduce your sodium intake. Most people should have less than 2,300 milligrams (mg) of sodium each day. If you have high blood pressure (hypertension), you may need to limit your sodium to 1,500 mg each day. Follow the tips below to help reduce your sodium intake. What are tips for eating less sodium? Reading food labels  Check the food label before buying or using packaged ingredients. Always check the label for the serving size and sodium content.  Look for products with no more than 140 mg of sodium in one serving.  Check the % Daily Value column to see what percent of the daily recommended amount of sodium is provided in one serving of the product. Foods with 5% or less in this column are considered low in sodium. Foods with 20% or higher are considered high in sodium.  Do not choose foods with salt as one of the first three ingredients on the ingredients list. If salt is one of the first three ingredients, it usually means the item is high in sodium.   Shopping  Buy sodium-free or low-sodium  products. Look for the following words on food labels: ? Low-sodium. ? Sodium-free. ? Reduced-sodium. ? No salt added. ? Unsalted.  Always check the sodium content even if foods are labeled as low-sodium or no salt added.  Buy fresh foods. Cooking  Use herbs, seasonings without salt, and spices as substitutes for salt.  Use sodium-free baking soda when baking.  Grill, braise, or roast foods to add flavor with less salt.  Avoid adding salt to pasta, rice, or hot cereals.  Drain and rinse canned vegetables, beans, and meat before use.  Avoid adding salt when cooking sweets and desserts.  Cook with low-sodium ingredients. What foods are high in sodium? Vegetables Regular canned vegetables (not low-sodium or reduced-sodium). Sauerkraut, pickled vegetables, and relishes. Olives. [place] fries. Onion rings. Regular canned tomato sauce and paste. Regular tomato and vegetable juice. Frozen vegetables in sauces. Grains Instant hot cereals. Bread stuffing, pancake, and biscuit mixes. Croutons. Seasoned rice or pasta mixes. Noodle soup cups. Boxed or frozen  macaroni and cheese. Regular salted crackers. Self-rising flour. Rolls. Bagels. Flour tortillas and wraps. Meats and other proteins Meat or fish that is salted, canned, smoked, cured, spiced, or pickled. This includes bacon, ham, sausages, hot dogs, corned beef, chipped beef, meat loaves, salt pork, jerky, pickled herring, anchovies, regular canned tuna, and sardines. Salted nuts. Dairy Processed cheese and cheese spreads. Cheese curds. Blue cheese. Feta cheese. String cheese. Regular cottage cheese. Buttermilk. Canned milk. The items listed above may not be a complete list of foods high in sodium. Actual amounts of sodium may be different depending on processing. Contact a dietitian for more information. What foods are low in sodium? Fruits Fresh, frozen, or canned fruit with no sauce added. Fruit juice. Vegetables Fresh or frozen  vegetables with no sauce added. "No salt added" canned vegetables. "No salt added" tomato sauce and paste. Low-sodium or reduced-sodium tomato and vegetable juice. Grains Noodles, pasta, quinoa, rice. Shredded or puffed wheat or puffed rice. Regular or quick oats (not instant). Low-sodium crackers. Low-sodium bread. Whole-grain bread and whole-grain pasta. Unsalted popcorn. Meats and other proteins Fresh or frozen whole meats, poultry (not injected with sodium), and fish with no sauce added. Unsalted nuts. Dried peas, beans, and lentils without added salt. Unsalted canned beans. Eggs. Unsalted nut butters. Low-sodium canned tuna or chicken. Dairy Milk. Soy milk. Yogurt. Low-sodium cheeses, such as Swiss, Monterey Jack, [place], and [employer]. Sherbet or ice cream (keep to  cup per serving). Cream cheese. Fats and oils Unsalted butter or margarine. Other foods Homemade pudding. Sodium-free baking soda and baking powder. Herbs and spices. Low-sodium seasoning mixes. Beverages Coffee and tea. Carbonated beverages. The items listed above may not be a complete list of foods low in sodium. Actual amounts of sodium may be different depending on processing. Contact a dietitian for more information. What are some salt alternatives when cooking? The following are herbs, seasonings, and spices that can be used instead of salt to flavor your food. Herbs should be fresh or dried. Do not choose packaged mixes. Next to the name of the herb, spice, or seasoning are some examples of foods you can pair it with. Herbs  Bay leaves - Soups, meat and vegetable dishes, and spaghetti sauce.  Basil - [employer], soups, pasta, and fish dishes.  Cilantro - Meat, poultry, and vegetable dishes.  Chili powder - Marinades and Mexican dishes.  Chives - Salad dressings and potato dishes.  Cumin - Mexican dishes, couscous, and meat dishes.  Dill - Fish dishes, sauces, and salads.  Fennel - Meat and vegetable  dishes, breads, and cookies.  Garlic (do not use garlic salt) - [place] dishes, meat dishes, salad dressings, and sauces.  Marjoram - Soups, potato dishes, and meat dishes.  Oregano - Pizza and spaghetti sauce.  Parsley - Salads, soups, pasta, and meat dishes.  Rosemary - [place] dishes, salad dressings, soups, and red meats.  Saffron - Fish dishes, pasta, and some poultry dishes.  Sage - Stuffings and sauces.  Tarragon - Fish and [employer].  Thyme - Stuffing, meat, and fish dishes. Seasonings  Lemon juice - Fish dishes, poultry dishes, vegetables, and salads.  Vinegar - Salad dressings, vegetables, and fish dishes. Spices  Cinnamon - Sweet dishes, such as cakes, cookies, and puddings.  Cloves - Gingerbread, puddings, and marinades for meats.  Curry - Vegetable dishes, fish and poultry dishes, and stir-fry dishes.  Ginger - Vegetable dishes, fish dishes, and stir-fry dishes.  Nutmeg - Pasta, vegetables, poultry, fish dishes, and custard.  Summary  Cooking with less salt is one way to reduce the amount of sodium that you get from food.  Buy sodium-free or low-sodium products.  Check the food label before using or buying packaged ingredients.  Use herbs, seasonings without salt, and spices as substitutes for salt in foods. This information is not intended to replace advice given to you by your health care provider. Make sure you discuss any questions you have with your health care provider. Document Revised: 09/09/2019 Document Reviewed: 09/09/2019 Elsevier Patient Education  [place].

## 2021-01-19 NOTE — Progress Notes (Signed)
Chronic Care Management Pharmacy Note  01/20/2021 Name:  Troy Heiny [name] MRN:  [id number] DOB:  Feb 21, 1941  Subjective: Troy Bruce [name] is an 80 y.o. year old male who is a primary patient of Burns, Bobette Mo, MD.  The CCM team was consulted for assistance with disease management and care coordination needs.    Engaged with patient face to face for initial visit in response to provider referral for pharmacy case management and/or care coordination services.   Consent to Services:  The patient was given the following information about Chronic Care Management services today, agreed to services, and gave verbal consent: 1. CCM service includes personalized support from designated clinical staff supervised by the primary care provider, including individualized plan of care and coordination with other care providers 2. 24/7 contact phone numbers for assistance for urgent and routine care needs. 3. Service will only be billed when office clinical staff spend 20 minutes or more in a month to coordinate care. 4. Only one practitioner may furnish and bill the service in a calendar month. 5.The patient may stop CCM services at any time (effective at the end of the month) by phone call to the office staff. 6. The patient will be responsible for cost sharing (co-pay) of up to 20% of the service fee (after annual deductible is met). Patient agreed to services and consent obtained.  Patient Care Team: Pincus Sanes, MD as PCP - General (Internal Medicine) Hillis Range, MD as PCP - Electrophysiology (Cardiology) Glenford Peers, MD (Hematology and Oncology) Denyse Dago, MD as Referring Physician (Thoracic Surgery) Laurey Morale, MD as Consulting Physician (Cardiology) Jene Every, MD as Consulting Physician (Orthopedic Surgery) Bond, Doran Stabler, MD as Referring Physician (Ophthalmology) Kathyrn Sheriff, [facility] as Pharmacist (Pharmacist)   Patient lives at home alone since his  wife passed recently. He has several hired caregivers who sit with him most days, and several family members who keep up with him as well. His caregivers set up a monthly pill box for him, combining [employer] pill packs with his OTC items.  Recent office visits: 08/02/20 Burns (PCP) - f/u depression. S/p tapering fluoxetine, starting lexapro. Discontinued Fluoxetine, f/u 6 mo  Recent consult visits: 08/05/20 Truett Perna (Oncology) - f/u colon cancer; currently in remission, f/u in 6 mo for CT.  08/09/20 [facility] (Cardiology) -f/u CHF, PAD, AFIB. BMP, CBC, chol stable. F/u 4 mos.  08/17/20 Dr Johney Frame (cardiology): EP f/u. S/p multiple afib ablations. Loop recorder implanted 08/2015. 0% afib burden today. F/U 6 months  09/21/20 Bond (Opthamology) - F/U glaucoma.  f/u in 2 mo., discontinue XALATAN, increase SIMBRINZA 3x day)   10/18/20 Mansouraty (Gastro): f/u hx colon cancer; decrease omeprazole from 40 to 20 mg daily  11/16/20 [place],FNP (Cardiology) - increase lasix 60 mg 2x day for two days then back to 20 mg BID.  12/05/20 Bond (Opthalmology) - continue current plan, f/u 3 mo  12/07/20 Dr Shirlee Latch (cardiology): HF clinic. Started Jardiance 10 mg; increased Lasix to 40 mg AM and 20 mg PM.  12/29/20 Simmons,PA-C (Cardiology) - increase lasix to 40 mg BID; f/u BMP 1 week, f/u 3 mo provider  Hospital visits: None in previous 6 months  Objective:  Lab Results  Component Value Date   CREATININE 1.73 (H) 01/05/2021   BUN 23 01/05/2021   GFR 54.19 (L) 11/30/2019   GFRNONAA 39 (L) 01/05/2021   GFRAA 74 05/16/2020   NA 139 01/05/2021   K 4.1 01/05/2021   CALCIUM 9.1  01/05/2021   CO2 24 01/05/2021   GLUCOSE 117 (H) 01/05/2021    Lab Results  Component Value Date/Time   HGBA1C 5.9 (A) 07/09/2019 10:28 AM   HGBA1C 6.3 (H) 12/03/2018 02:23 PM   HGBA1C 5.9 11/04/2017 02:44 PM   HGBA1C 6.1 02/10/2015 10:51 AM   GFR 54.19 (L) 11/30/2019 03:16 PM   GFR 52.56 (L) 07/14/2018 05:16 PM    MICROALBUR 0.4 09/29/2007 10:59 AM    Last diabetic Eye exam: No results found for: HMDIABEYEEXA  Last diabetic Foot exam: No results found for: HMDIABFOOTEX   Lab Results  Component Value Date   CHOL 101 12/07/2020   HDL 34 (L) 12/07/2020   LDLCALC 31 12/07/2020   TRIG 179 (H) 12/07/2020   CHOLHDL 3.0 12/07/2020    Hepatic Function Latest Ref Rng & Units 12/09/2019 09/04/2019 12/03/2018  Total Protein 6.5 - 8.1 g/dL 7.3 7.0 7.0  Albumin 3.5 - 5.0 g/dL 3.6 3.6 3.2(L)  AST 15 - 41 U/L $Remo'23 17 25  'ltghf$ ALT 0 - 44 U/L $Remo'22 15 26  'DRhnX$ Alk Phosphatase 38 - 126 U/L 82 100 117  Total Bilirubin 0.3 - 1.2 mg/dL 0.8 0.6 0.6  Bilirubin, Direct 0.0 - 0.2 mg/dL 0.2 - -    Lab Results  Component Value Date/Time   TSH 2.05 06/02/2020 03:16 PM   TSH 1.166 04/21/2020 02:39 PM   TSH 2.48 11/30/2019 03:16 PM   FREET4 0.92 06/02/2020 03:16 PM   FREET4 1.12 11/30/2019 03:16 PM    CBC Latest Ref Rng & Units 11/10/2020 10/18/2020 08/09/2020  WBC 4.0 - 10.5 K/uL 5.5 6.1 4.7  Hemoglobin 13.0 - 17.0 g/dL 12.4(L) 13.9 13.3  Hematocrit 39.0 - 52.0 % 38.3(L) 41.1 41.2  Platelets 150 - 400 K/uL 231 278.0 260    No results found for: VD25OH  Clinical ASCVD: Yes  The ASCVD Risk score Mikey Bussing DC Jr., et al., 2013) failed to calculate for the following reasons:   The 2013 ASCVD risk score is only valid for ages 50 to 48    Depression screen PHQ 2/9 08/02/2020  Decreased Interest 0  Down, Depressed, Hopeless 0  PHQ - 2 Score 0  Some recent data might be hidden    CHA2DS2-VASc Score = 6  The patient's score is based upon: CHF History: No HTN History: Yes Diabetes History: No Stroke History: Yes Vascular Disease History: Yes      Social History   Tobacco Use  Smoking Status Former Smoker  . Packs/day: 0.50  . Years: 50.00  . Pack years: 25.00  . Types: Cigarettes  . Quit date: 07/28/2014  . Years since quitting: 6.4  Smokeless Tobacco Never Used   BP Readings from Last 3 Encounters:  12/29/20 140/78   12/07/20 130/62  11/16/20 120/70   Pulse Readings from Last 3 Encounters:  12/29/20 (!) 57  12/07/20 (!) 55  11/16/20 (!) 57   Wt Readings from Last 3 Encounters:  12/29/20 201 lb (91.2 kg)  12/07/20 205 lb 3.2 oz (93.1 kg)  11/16/20 204 lb 9.6 oz (92.8 kg)   BMI Readings from Last 3 Encounters:  12/29/20 26.16 kg/m  12/07/20 26.71 kg/m  11/16/20 26.63 kg/m    Assessment/Interventions: Review of patient past medical history, allergies, medications, health status, including review of consultants reports, laboratory and other test data, was performed as part of comprehensive evaluation and provision of chronic care management services.   SDOH:  (Social Determinants of Health) assessments and interventions performed: Yes  SDOH Screenings  Alcohol Screen: Low Risk   . Last Alcohol Screening Score (AUDIT): 3  Depression (PHQ2-9): Low Risk   . PHQ-2 Score: 0  Financial Resource Strain: Low Risk   . Difficulty of Paying Living Expenses: Not hard at all  Food Insecurity: No Food Insecurity  . Worried About Charity fundraiser in the Last Year: Never true  . Ran Out of Food in the Last Year: Never true  Housing: Low Risk   . Last Housing Risk Score: 0  Physical Activity: Insufficiently Active  . Days of Exercise per Week: 3 days  . Minutes of Exercise per Session: 30 min  Social Connections: Not on file  Stress: No Stress Concern Present  . Feeling of Stress : Not at all  Tobacco Use: Medium Risk  . Smoking Tobacco Use: Former Smoker  . Smokeless Tobacco Use: Never Used  Transportation Needs: No Transportation Needs  . Lack of Transportation (Medical): No  . Lack of Transportation (Non-Medical): No    CCM Care Plan  Allergies  Allergen Reactions  . Niacin-Lovastatin Er Shortness Of Breath and Other (See Comments)    Dyspnea, flushing  . Penicillins Hives, Shortness Of Breath and Other (See Comments)    Flushing  & dyspnea Because of a history of documented  adverse serious drug reaction;Medi Alert bracelet  is recommended PCN reaction causing immediate rash, facial/tongue/throat swelling, SOB or lightheadedness with hypotension: Yes PCN reaction causing severe rash involving mucus membranes or skin necrosis: No PCN reaction occurring within the last 10 years: NO PCN reaction that required hospitalization: NO  . Sulfonamide Derivatives Hives, Shortness Of Breath and Other (See Comments)    Flushing & dyspnea Because of a history of documented adverse serious drug reaction;Medi Alert bracelet  is recommended  . Atorvastatin Other (See Comments)    Myalgias & athralgias- takes Crestor, DOES NOT LIKE LIPITOR    Medications Reviewed Today    Reviewed by Charlton Haws, [facility] (Pharmacist) on 01/19/21 at 1526  Med List Status: <None>  Medication Order Taking? Sig Documenting Provider Last Dose Status Informant  amLODipine (NORVASC) 5 MG tablet [id number] Yes TAKE 1 TABLET ONCE DAILY. Larey Dresser, MD Taking Active   Brinzolamide-Brimonidine [facility]) 1-0.2 % SUSP [id number] Yes Place 1 drop into both eyes 2 times daily. [provider] Taking Active   docusate sodium (COLACE) 100 MG capsule [id number] Yes Take 100 mg by mouth 2 (two) times daily.  [provider] Taking Active Spouse/Significant Other  ELIQUIS 5 MG TABS tablet [id number] Yes TAKE 1 TABLET BY MOUTH TWICE DAILY. Larey Dresser, MD Taking Active   empagliflozin (JARDIANCE) 10 MG TABS tablet [id number] Yes Take 1 tablet (10 mg total) by mouth daily before breakfast. Larey Dresser, MD Taking Active   escitalopram (LEXAPRO) 10 MG tablet [id number] Yes TAKE 1 TABLET BY MOUTH DAILY. Binnie Rail, MD Taking Active   famotidine (PEPCID) 20 MG tablet [id number] Yes Take 20 mg by mouth daily as needed for heartburn. [provider] Taking Active   furosemide (LASIX) 20 MG tablet [id number] Yes Take 2 tablets (40 mg total) by mouth 2 (two) times daily.  Patient  taking differently: Take 40 mg by mouth 2 (two) times daily. 40 mg AM and 20 mg PM   Simmons, Brittainy M, PA-C Taking Active   latanoprost (XALATAN) 0.005 % ophthalmic solution [id number] Yes  [provider] Taking Active   losartan (COZAAR) 25 MG tablet [id number] Yes Take 1 tablet (25 mg total)  by mouth daily. Larey Dresser, MD Taking Active   Melatonin 10 MG CAPS [id number] Yes Take 10 mg by mouth at bedtime. [provider] Taking Active Spouse/Significant Other  metoprolol succinate (TOPROL-XL) 25 MG 24 hr tablet [id number] Yes TAKE 1/2 TABLET IN THE EVENING WITH OR IMMEDIATELY FOLLOWING A MEAL Larey Dresser, MD Taking Active   montelukast (SINGULAIR) 10 MG tablet [id number] Yes TAKE ONE TABLET AT BEDTIME. Binnie Rail, MD Taking Active   Multiple Vitamins-Minerals (CENTRUM SILVER PO) [id number] Yes Take 1 tablet by mouth daily. [provider] Taking Active Spouse/Significant Other  omeprazole (PRILOSEC) 20 MG capsule [id number] Yes TAKE (1) CAPSULE TWICE DAILY.  Patient taking differently: Take 20 mg by mouth daily.   Mansouraty, Telford Nab., MD Taking Active   Polyethyl Glycol-Propyl Glycol 0.4-0.3 % SOLN [id number] Yes Place 1 drop into both eyes daily as needed (for dry eyes).  [provider] Taking Active Spouse/Significant Other  potassium chloride SA (KLOR-CON) 20 MEQ tablet [id number] Yes TAKE 1 TABLET ONCE DAILY. Larey Dresser, MD Taking Active   Probiotic Product (ALIGN) [address] [place] [id number] Yes Take by mouth. [provider] Taking Active   rosuvastatin (CRESTOR) 40 MG tablet [id number] Yes TAKE 1 TABLET ONCE DAILY. Larey Dresser, MD Taking Active   senna (SENOKOT) 8.6 MG tablet [id number] Yes Take 1 tablet by mouth 2 (two) times daily. [provider] Taking Active Spouse/Significant Other  SYMBICORT 160-4.5 MCG/ACT inhaler [id number] Yes USE 2 PUFFS TWICE DAILY. Binnie Rail, MD Taking Active           Patient Active  Problem List   Diagnosis Date Noted  . Chronic anticoagulation 10/18/2020  . Hx of adenomatous colonic polyps 10/18/2020  . History of gastric polyps 10/18/2020  . Iron deficiency 10/18/2020  . History of anemia 10/18/2020  . Gastric intestinal metaplasia 10/18/2020  . Aortic atherosclerosis ([place]) 08/01/2020  . Secondary hypercoagulable state ([place]) 03/22/2020  . Personal history of colon cancer 01/08/2020  . Epigastric pain 01/08/2020  . Abnormal findings on esophagogastroduodenoscopy (EGD) 01/08/2020  . Calculus of gallbladder without cholecystitis without obstruction 01/08/2020  . Vertigo 11/25/2019  . Cancer of sigmoid colon ([place]) 12/12/2018  . Pleural effusion on right 07/15/2018  . Disease of lymph node   . Colon adenocarcinoma ([place]) 06/30/2018  . Acute GI bleeding 06/23/2018  . Depression 05/06/2018  . Syncope 01/29/2018  . Chronic diastolic CHF (congestive heart failure) ([place]) 05/04/2017  . CAD (coronary artery disease) 05/03/2017  . MGUS (monoclonal gammopathy of unknown significance) 03/07/2016  . Benign essential HTN 01/19/2016  . OSA (obstructive sleep apnea) 08/29/2015  . PAF (paroxysmal atrial fibrillation) ([place])   . Glaucoma 06/22/2015  . DOE (dyspnea on exertion) 02/04/2015  . Memory loss, short term 08/03/2014  . Anemia, unspecified 12/28/2013  . Occlusion and stenosis of carotid artery without mention of cerebral infarction 07/09/2013  . COPD GOLD II  07/09/2013  . Chronic renal disease, stage 3, moderately decreased glomerular filtration rate between 30-59 mL/min/1.73 square meter (HCC) 07/06/2013  . TIA (transient ischemic attack) 07/06/2013  . Basal cell carcinoma of skin 10/10/2012  . History of lung cancer 03/11/2012  . PAD (peripheral artery disease) ([place]) 12/16/2011  . AAA (abdominal aortic aneurysm) ([place]) 09/27/2011  . Squamous cell lung cancer ([place]) 11/08/2010  . GAIT IMBALANCE 03/02/2009  . Prediabetes 03/02/2009  . History of colonic polyps  03/02/2009  . OBSTRUCTIVE CHRONIC BRONCHITIS WITHOUT EXACERBAT 01/15/2008  . HYPERPLASIA PROSTATE UNS W/O UR  OBST & OTH LUTS 01/15/2008  . HYPERLIPIDEMIA 09/29/2007  . Peripheral vascular disease ([place]) 09/29/2007  . LEUKOPLAKIA, VOCAL CORDS 09/29/2007  . FEMORAL BRUIT 09/29/2007    Immunization History  Administered Date(s) Administered  . Fluad Quad(high Dose 65+) 08/11/2020  . Influenza Split 07/05/2017  . Influenza Whole 07/28/2010  . Influenza,inj,Quad PF,6+ Mos 07/07/2013, 07/30/2014  . Influenza-Unspecified 07/02/2015  . PFIZER(Purple Top)SARS-COV-2 Vaccination 10/18/2019, 11/08/2019, 06/14/2020  . Pneumococcal Conjugate-13 11/28/2015  . Pneumococcal Polysaccharide-23 10/02/2007  . Td 03/14/2010    Conditions to be addressed/monitored:  Hypertension, Hyperlipidemia, Atrial Fibrillation, Heart Failure, Coronary Artery Disease, GERD, COPD, Chronic Kidney Disease and Depression  Care Plan : CCM Pharmacy Care Plan  Updates made by Charlton Haws, RPH since 01/20/2021 12:00 AM    Problem: Hypertension, Hyperlipidemia, Atrial Fibrillation, Heart Failure, Coronary Artery Disease, GERD, COPD, Chronic Kidney Disease and Depression   Priority: High    Long-Range Goal: Disease management   Start Date: 01/20/2021  Expected End Date: 07/22/2021  This Visit's Progress: On track  Priority: High  Note:   Current Barriers:  . Unable to independently monitor therapeutic efficacy . Unable to maintain control of constipation  Pharmacist Clinical Goal(s):  [name] Patient will achieve adherence to monitoring guidelines and medication adherence to achieve therapeutic efficacy through collaboration with PharmD and provider.   Interventions: . 1:1 collaboration with Binnie Rail, MD regarding development and update of comprehensive plan of care as evidenced by provider attestation and co-signature . Inter-disciplinary care team collaboration (see longitudinal plan of  care) . Comprehensive medication review performed; medication list updated in electronic medical record  Hyperlipidemia: (LDL goal < 70) -Hx TIA, PAD, CAD (aortic atherosclerosis) -Controlled - LDL is at goal, pt denies side effects -Current treatment: . Rosuvastatin 40 mg daily -Educated on Cholesterol goals;  Benefits of statin for ASCVD risk reduction; -Recommended to continue current medication  Atrial Fibrillation (Goal: prevent stroke and major bleeding) -Controlled - pt reports Afib episodes are rare since ablation; he denies bleeding issues -CHADSVASC: 6 -hx GI bleed 06/2018 related to colon cancer -Current treatment: . Rate control: Metoprolol succinate 25 mg - 1/2 tab daily . Anticoagulation: Eliquis 5 mg BID -Home BP and HR readings: not checking  -Counseled on increased risk of stroke due to Afib and benefits of anticoagulation for stroke prevention; importance of adherence to anticoagulant exactly as prescribed; bleeding risk associated with Eliquis and importance of self-monitoring for signs/symptoms of bleeding; -Recommended to continue current medication  Heart Failure / HTN (Goal: BP < 140/90, prevent exacerbations) -Relatively controlled - pt reports frequent urination on furosemide, but swelling and shortness of breath are stable now -Last ejection fraction: 60-65% (Date: 01/13/2020) -HF type: Diastolic -NYHA Class: III (marked limitation of activity) -Current treatment: . Amlodipine 5 mg daily . Furosemide 20 mg - 2 tab BID . Losartan 25 mg daily . Metoprolol succinate 25 mg - 1/2 tab daily . Jardiance 10 mg daily . Klor-Con 20 mEq daily -Current dietary habits: eats out 3-4 nights per week; he does not add salt to food but eats fried foods, snacks often that may be high in sodium -Current exercise habits: limited  -Educated on Benefits of medications for managing symptoms and prolonging life Proper diuretic administration and potassium supplementation   Importance of avoiding salt -Recommended to continue current medication  COPD (Goal: control symptoms and prevent exacerbations) -Controlled - pt reports breathing/SOB is currently under control -Gold Grade: Gold 2 (FEV1 50-79%) -COPD Classification:  A (low sx, <2  exacerbations/yr) -MMRC/CAT score: not on file -Pulmonary function testing: (10/04/2016) FEV1 62% predicted, FEV1/FVC 0.64 -Exacerbations requiring treatment in last 6 months: 0 -Current treatment  . Symbicort 160-4.5 mcg/act 2 puffs BID . Montelukast 10 mg HS -Patient reports consistent use of maintenance inhaler -Frequency of rescue inhaler use: n/a -Counseled on Benefits of consistent maintenance inhaler use -Recommended to continue current medication  Depression/Anxiety (Goal: manage symptoms) -Controlled - pt reports he has his ups and downs while still mourning his wife but overall mood is "good" -Current treatment: . Escitalopram 10 mg daily -Medications previously tried/failed: fluoxetine -PHQ9: 0 (08/2020) -GAD7: not on file -Connected with PCP for mental health support -Educated on Benefits of medication for symptom control -Recommended to continue current medication  GERD / hx GI bleed (Goal: manage symptoms) -Controlled - pt is not sure how often he is taking omeprazole; it is part of his pill packs and he also has 2 extra bottles; of note pt reports constipation is an issue -GI bleed 06/2018 related to colon cancer -Current treatment  . Omeprazole 20 mg BID . Famotidine 20 mg PRN (rarely used) -Discussed that extra bottles of omeprazole (filled 11/28/20 and 12/25/20) indicate that his caregivers are likely only giving it to him once a day -Counseled that omeprazole may be contributing to constipation -Recommended to take omeprazole once a day - verify with caregivers that the are only putting 1 pill per day in his pill box  Glaucoma (Goal: maintain IOP in goal range) -Controlled - follows with Dr Edilia Bo  regularly -Current treatment  . Simbrinza (brinzolamide-brimonidine) eye drops BID . Latanoprost 0.005% eye drops HS . Artificial Tears PRN -Patient is satisfied with current regimen and denies issues -Recommended to continue current medication  Health Maintenance -Vaccine gaps: TDAP, Shingrix -Current therapy:  . Docusate 100 mg BID . Senna 8.6 mg BID . Melatonin 10 mg HS . Multivitamin . Digestive Health gummies -Counseled on contents of digestive gummies - contains sugar, corn syrup and gelatin which may exacerbate constipation -Recommend to switch to Align Probiotic (or store brand)  Patient Goals/Self-Care Activities . Patient will:  - take medications as prescribed focus on medication adherence by pill box weigh daily, and contact provider if weight gain of 3+ lbs overnight engage in dietary modifications by minimizing salt  Switch to Align (or similar) probiotic  Follow Up Plan: Telephone follow up appointment with care management team member scheduled for: 3 months      Medication Assistance: None required.  Patient affirms current coverage meets needs.  Patient's preferred pharmacy is:  [facility], [place] [address] Phone: [phone] Fax: [phone]  Uses pill box? Yes Pt endorses 100% compliance  We discussed: Current pharmacy is preferred with insurance plan and patient is satisfied with pharmacy services Patient decided to: Continue current medication management strategy  Care Plan and Follow Up Patient Decision:  Patient agrees to Care Plan and Follow-up.  Plan: Telephone follow up appointment with care management team member scheduled for:  3 months  Charlene Brooke, PharmD, [place], CPP Clinical Pharmacist [facility] [phone]

## 2021-01-23 ENCOUNTER — Ambulatory Visit (INDEPENDENT_AMBULATORY_CARE_PROVIDER_SITE_OTHER): Payer: PPO

## 2021-01-23 DIAGNOSIS — I4891 Unspecified atrial fibrillation: Secondary | ICD-10-CM

## 2021-01-24 ENCOUNTER — Encounter (HOSPITAL_COMMUNITY): Payer: Self-pay

## 2021-01-24 ENCOUNTER — Other Ambulatory Visit: Payer: Self-pay

## 2021-01-24 ENCOUNTER — Other Ambulatory Visit: Payer: PPO

## 2021-01-24 ENCOUNTER — Ambulatory Visit (HOSPITAL_BASED_OUTPATIENT_CLINIC_OR_DEPARTMENT_OTHER)
Admission: RE | Admit: 2021-01-24 | Discharge: 2021-01-24 | Disposition: A | Discharge status: Home / Self Care | Payer: PPO | Source: Ambulatory Visit | Attending: Oncology | Admitting: Oncology

## 2021-01-24 ENCOUNTER — Inpatient Hospital Stay: Payer: PPO | Attending: Oncology

## 2021-01-24 DIAGNOSIS — C187 Malignant neoplasm of sigmoid colon: Secondary | ICD-10-CM | POA: Insufficient documentation

## 2021-01-24 DIAGNOSIS — Z902 Acquired absence of lung [part of]: Secondary | ICD-10-CM | POA: Insufficient documentation

## 2021-01-24 DIAGNOSIS — C3431 Malignant neoplasm of lower lobe, right bronchus or lung: Secondary | ICD-10-CM | POA: Insufficient documentation

## 2021-01-24 DIAGNOSIS — C3490 Malignant neoplasm of unspecified part of unspecified bronchus or lung: Secondary | ICD-10-CM | POA: Diagnosis not present

## 2021-01-24 DIAGNOSIS — R918 Other nonspecific abnormal finding of lung field: Secondary | ICD-10-CM | POA: Insufficient documentation

## 2021-01-24 LAB — POCT I-STAT CREATININE: Creatinine, Ser: 1.4 mg/dL — ABNORMAL HIGH (ref 0.61–1.24)

## 2021-01-24 MED ORDER — IOHEXOL 300 MG/ML  SOLN
60.0000 mL | Freq: Once | INTRAMUSCULAR | Status: AC | PRN
  Administered 2021-01-24: 60 mL via INTRAVENOUS

## 2021-01-26 ENCOUNTER — Telehealth (HOSPITAL_COMMUNITY): Payer: Self-pay

## 2021-01-26 ENCOUNTER — Inpatient Hospital Stay: Payer: PPO | Admitting: Oncology

## 2021-01-26 ENCOUNTER — Other Ambulatory Visit: Payer: Self-pay

## 2021-01-26 DIAGNOSIS — C3431 Malignant neoplasm of lower lobe, right bronchus or lung: Secondary | ICD-10-CM | POA: Diagnosis not present

## 2021-01-26 DIAGNOSIS — C187 Malignant neoplasm of sigmoid colon: Secondary | ICD-10-CM | POA: Diagnosis not present

## 2021-01-26 DIAGNOSIS — R918 Other nonspecific abnormal finding of lung field: Secondary | ICD-10-CM | POA: Diagnosis not present

## 2021-01-26 DIAGNOSIS — Z902 Acquired absence of lung [part of]: Secondary | ICD-10-CM | POA: Diagnosis not present

## 2021-01-26 DIAGNOSIS — C3490 Malignant neoplasm of unspecified part of unspecified bronchus or lung: Secondary | ICD-10-CM

## 2021-01-26 NOTE — Telephone Encounter (Signed)
Received a text from Troy [name]'s daughter asking if paramedicine team could come out to see her dad. [name] reporting Ellen stating he did not feel well and he was dizzy. She reports his BP was 154/61 and HR 52. I advised her it was after hours for paramedicine visit but informed her to call EMS if he felt necessary and to follow up with [facility] in the morning if he continues to feel poorly. [name] agreed with plan.

## 2021-01-26 NOTE — Progress Notes (Signed)
[facility] OFFICE PROGRESS NOTE   Diagnosis: Colon cancer, lung cancer  INTERVAL HISTORY:   Troy [name] returns as scheduled.  He feels well.  Good appetite.  He has stable exertional dyspnea.  No difficulty with bowel function.  No cough.  Objective:  Vital signs in last 24 hours:  Blood pressure (!) 116/58, pulse 65, temperature 98.1 F (36.7 C), temperature source Oral, resp. rate 16, weight 203 lb (92.1 kg), SpO2 99 %.    Lymphatics: No cervical, supraclavicular, axillary, or inguinal nodes Resp: Decreased breath sounds at the right lower posterior chest, no respiratory distress Cardio: Regular rate and rhythm GI: No mass, nontender, no hepatosplenomegaly Vascular: No leg edema    Lab Results:  Lab Results  Component Value Date   WBC 5.5 11/10/2020   HGB 12.4 (L) 11/10/2020   HCT 38.3 (L) 11/10/2020   MCV 95.5 11/10/2020   PLT 231 11/10/2020   NEUTROABS 3.4 08/05/2020    CMP  Lab Results  Component Value Date   NA 139 01/05/2021   K 4.1 01/05/2021   CL 107 01/05/2021   CO2 24 01/05/2021   GLUCOSE 117 (H) 01/05/2021   BUN 23 01/05/2021   CREATININE 1.40 (H) 01/24/2021   CALCIUM 9.1 01/05/2021   PROT 7.3 12/09/2019   ALBUMIN 3.6 12/09/2019   AST 23 12/09/2019   ALT 22 12/09/2019   ALKPHOS 82 12/09/2019   BILITOT 0.8 12/09/2019   GFRNONAA 39 (L) 01/05/2021   GFRAA 74 05/16/2020    Lab Results  Component Value Date   CEA1 <1.00 08/05/2020    Imaging:  CT CHEST W CONTRAST  Result Date: 01/24/2021 CLINICAL DATA:  Primary Cancer Type: Lung Imaging Indication: Routine surveillance Interval therapy since last imaging? No Initial Cancer Diagnosis Date: 2012; Established by: Biopsy-proven Detailed Pathology: Stage Ib squamous cell carcinoma. Primary Tumor location: Right lower lobe. Surgeries: Right lower lobectomy 01/24/2011. Chemotherapy: No Immunotherapy? No Radiation therapy? No Other Cancers: Adenocarcinoma of sigmoid colon - stage II.  EXAM: CT CHEST WITH CONTRAST TECHNIQUE: Multidetector CT imaging of the chest was performed during intravenous contrast administration. CONTRAST:  16mL OMNIPAQUE IOHEXOL 300 MG/ML  SOLN COMPARISON:  Most recent CT chest 01/14/2020.  12/22/2010 PET-CT. FINDINGS: Cardiovascular: Normal heart size. No significant pericardial effusion/thickening. Three-vessel coronary atherosclerosis. Atherosclerotic nonaneurysmal thoracic aorta. Top-normal caliber main pulmonary artery (3.2 cm diameter). No central pulmonary emboli. Mediastinum/Nodes: Subcentimeter hypodense left thyroid nodule is stable. Not clinically significant; no follow-up imaging recommended (ref: J Am Coll Radiol. 2015 Feb;12(2): 143-50). Unremarkable esophagus. No axillary adenopathy. Stable mildly enlarged 1.0 cm short axis diameter high posterior right mediastinal node (series 2/image 22). No additional pathologically enlarged mediastinal nodes. No pathologically enlarged hilar nodes. Lungs/Pleura: No pneumothorax. Status post right lower lobectomy. Smooth right pleural thickening and loculated small right pleural effusion, unchanged. No left pleural effusion. Moderate centrilobular emphysema with diffuse bronchial wall thickening. Irregular nodular opacity in the basilar right upper lobe along the minor fissure measures 1.7 x 1.4 cm (series 4/image 78), previously 1.3 x 1.3 cm, mildly increased. Separate 1.1 x 0.9 cm nodular opacity along the minor fissure in the basilar right upper lobe (series 4/image 87), previously 0.9 x 0.6 cm, mildly increased. Peribronchovascular solid 0.8 cm medial right middle lobe nodule (series 4/image 97), previously 0.8 cm, stable. Tiny peripheral left upper lobe solid pulmonary nodules, largest 0.3 cm (series 4/image 32), stable. No new significant pulmonary nodules. Upper abdomen: Small indistinct focus of hyperenhancement in the posterior right liver lobe (  series 2/image 137) is stable and presumably benign. Cholelithiasis.  Musculoskeletal: No aggressive appearing focal osseous lesions. Subcutaneous loop recorder in the ventral left chest wall. Moderate thoracic spondylosis. IMPRESSION: 1. Mild interval growth of two separate irregular nodular opacities in the basilar right upper lobe along the minor fissure, largest 1.7 cm, equivocal for recurrent malignancy. Suggest PET-CT at this time for further characterization. 2. Stable mild high posterior right mediastinal lymphadenopathy. 3. Status post right lower lobectomy. Stable smooth right pleural thickening and loculated small right pleural effusion. 4. Three-vessel coronary atherosclerosis. 5. Cholelithiasis. 6. Aortic Atherosclerosis (ICD10-I70.0) and Emphysema (ICD10-J43.9). These results will be called to the ordering clinician or representative by the Radiologist Assistant, and communication documented in the PACS or [employer]. Electronically Signed   By: Ilona Sorrel M.D.   On: 01/24/2021 10:54    Medications: I have reviewed the patient's current medications.      Assessment/Plan:  1. Adenocarcinoma of sigmoid colon-stage II (T3N0)  Colonoscopy 06/25/2018- multiple polyps in the ascending and descending colon, partially obstructing mass in the sigmoid colon-biopsy confirmed adenocarcinoma  CTs 06/25/2018-enlarging right paratracheal lymph node compared to November 2018, no other evidence of metastatic disease  Low anterior resection 12/12/2018, stage II (T3N0) adenocarcinoma the sigmoid colon, no loss of mismatch repair protein expression, MSI-stable  Colonoscopy 06/23/2020-polyps removed from the transverse colon, ascending colon, and cecum-tubular adenomas   CT chest 01/14/2020-stable mildly enlarged high right paratracheal node and right hilar node  CT abdomen/pelvis 01/29/2020-no evidence of recurrent disease, stable right lower lobe nodule  CT chest 01/24/2021-mild growth of 2 irregular basilar right upper lobe nodules, stable high right mediastinal  node   2.  History of iron deficiency anemia Secondary to bleeding from tumor, exacerbated by anticoagulation with eliquis 3.Stage Ib squamous cell carcinoma of right lower lung s/pbronchoscopy, mediastinoscopy, right thoracoscopic lowerlobectomy 01/24/2011 -Path poorly differentiated squamous cell carcinoma with positive vascular invasion LN stations 4R, 4L, 7, 8, 9, 10, 11, and 12 w/o evidence of malignancy - pT2a,pN0,pMX stage IB  -managed and monitored per Dr. Elenor Quinones at [facility]. Last Ct chest 05/01/18 stable, without concerning evidence of recurrence -CT 06/25/2018- nonspecific small bilateral nodules, enlarging superior right paratracheal node -EUS biopsy of the right paratracheal nodes (several rounded nodes measuring 4 cm in total) on 07/10/2018- negative for malignancy -CT chest 03/03/2019- stable postoperative appearance of the chest, unchanged mildly prominent right paratracheal lymph node, no evidence of metastatic disease -CT chest 01/14/2020-stable surgical changes in the right chest, stable mild mediastinal and right hilar adenopathy -CT chest 01/24/2021- mild interval growth of irregular nodular opacities in the right upper lobe along the minor fissure  4. COPD 5. Atrial fibrillation 6. Thyrotoxicosis-potentially amiodarone related 7.  Multiple gastric polyps on endoscopy 06/23/2020-1 oozing polyp-hyperplastic polyp, biopsy from the gastric antrum revealed intestinal metaplasia       Disposition: TroyTroy [name] is in clinical remission from colon cancer and lung cancer.  I reviewed the restaging chest CT images with him.  2 nodular areas in the right upper lobe appear slightly larger.  These could represent new lung cancers or metastases.  He agrees to a staging PET scan.  He will be scheduled for a PET scan and return for an office visit in approximately 2 weeks.  Betsy Coder, MD  01/26/2021  2:22 PM

## 2021-01-29 NOTE — Patient Instructions (Addendum)
   Medications changes include :   none    Please followup in 6 months

## 2021-01-29 NOTE — [place] Notes (Signed)
Subjective:    Patient ID: Troy Kindred [name], male    DOB: 08-05-1941, 80 y.o.   MRN: [id number]  HPI The patient is here for follow up of their chronic medical problems, including htn, depression, prediabetes, CKD, COPD    He was placed on jardiance by Cardio.   He is eating fairly well.  He is exercising 3 times a week with a trainer.    Overall he feels well and has no concerns.  Medications and allergies reviewed with patient and updated if appropriate.  Patient Active Problem List   Diagnosis Date Noted  . Chronic anticoagulation 10/18/2020  . Hx of adenomatous colonic polyps 10/18/2020  . History of gastric polyps 10/18/2020  . Iron deficiency 10/18/2020  . History of anemia 10/18/2020  . Gastric intestinal metaplasia 10/18/2020  . Aortic atherosclerosis ([place]) 08/01/2020  . Secondary hypercoagulable state ([place]) 03/22/2020  . Personal history of colon cancer 01/08/2020  . Epigastric pain 01/08/2020  . Abnormal findings on esophagogastroduodenoscopy (EGD) 01/08/2020  . Calculus of gallbladder without cholecystitis without obstruction 01/08/2020  . Vertigo 11/25/2019  . Cancer of sigmoid colon ([place]) 12/12/2018  . Pleural effusion on right 07/15/2018  . Colon adenocarcinoma ([place]) 06/30/2018  . Acute GI bleeding 06/23/2018  . Depression 05/06/2018  . Syncope 01/29/2018  . Chronic diastolic CHF (congestive heart failure) ([place]) 05/04/2017  . CAD (coronary artery disease) 05/03/2017  . MGUS (monoclonal gammopathy of unknown significance) 03/07/2016  . Benign essential HTN 01/19/2016  . OSA (obstructive sleep apnea) 08/29/2015  . PAF (paroxysmal atrial fibrillation) ([place])   . Glaucoma 06/22/2015  . DOE (dyspnea on exertion) 02/04/2015  . Memory loss, short term 08/03/2014  . Anemia, unspecified 12/28/2013  . Occlusion and stenosis of carotid artery without mention of cerebral infarction 07/09/2013  . COPD GOLD II  07/09/2013  . Chronic renal disease, stage 3,  moderately decreased glomerular filtration rate between 30-59 mL/min/1.73 square meter (HCC) 07/06/2013  . TIA (transient ischemic attack) 07/06/2013  . Basal cell carcinoma of skin 10/10/2012  . History of lung cancer 03/11/2012  . PAD (peripheral artery disease) ([place]) 12/16/2011  . AAA (abdominal aortic aneurysm) ([place]) 09/27/2011  . Squamous cell lung cancer ([place]) 11/08/2010  . GAIT IMBALANCE 03/02/2009  . Prediabetes 03/02/2009  . History of colonic polyps 03/02/2009  . OBSTRUCTIVE CHRONIC BRONCHITIS WITHOUT EXACERBAT 01/15/2008  . HYPERPLASIA PROSTATE UNS W/O UR OBST & OTH LUTS 01/15/2008  . HYPERLIPIDEMIA 09/29/2007  . Peripheral vascular disease ([place]) 09/29/2007  . LEUKOPLAKIA, VOCAL CORDS 09/29/2007  . FEMORAL BRUIT 09/29/2007    Current Outpatient Medications on File Prior to Visit  Medication Sig Dispense Refill  . amLODipine (NORVASC) 5 MG tablet TAKE 1 TABLET ONCE DAILY. 60 tablet 1  . Brinzolamide-Brimonidine (SIMBRINZA) 1-0.2 % SUSP Place 1 drop into both eyes 2 times daily.    [name] docusate sodium (COLACE) 100 MG capsule Take 100 mg by mouth 2 (two) times daily.     [name] ELIQUIS 5 MG TABS tablet TAKE 1 TABLET BY MOUTH TWICE DAILY. 120 tablet 1  . empagliflozin (JARDIANCE) 10 MG TABS tablet Take 1 tablet (10 mg total) by mouth daily before breakfast. 30 tablet 11  . escitalopram (LEXAPRO) 10 MG tablet TAKE 1 TABLET BY MOUTH DAILY. 28 tablet 5  . famotidine (PEPCID) 20 MG tablet Take 20 mg by mouth daily as needed for heartburn.    . furosemide (LASIX) 20 MG tablet Take 2 tablets (40 mg total) by mouth 2 (two) times  daily. (Patient taking differently: Take 40 mg by mouth 2 (two) times daily. 40 mg AM and 20 mg PM) 120 tablet 11  . latanoprost (XALATAN) 0.005 % ophthalmic solution     . losartan (COZAAR) 25 MG tablet Take 1 tablet (25 mg total) by mouth daily. 90 tablet 3  . Melatonin 10 MG CAPS Take 10 mg by mouth at bedtime.    . metoprolol succinate (TOPROL-XL) 25 MG 24 hr  tablet TAKE 1/2 TABLET IN THE EVENING WITH OR IMMEDIATELY FOLLOWING A MEAL 30 tablet 1  . montelukast (SINGULAIR) 10 MG tablet TAKE ONE TABLET AT BEDTIME. 28 tablet 5  . Multiple Vitamins-Minerals (CENTRUM SILVER PO) Take 1 tablet by mouth daily.    [name] omeprazole (PRILOSEC) 20 MG capsule TAKE (1) CAPSULE TWICE DAILY. (Patient taking differently: Take 20 mg by mouth daily.) 56 capsule 3  . Polyethyl Glycol-Propyl Glycol 0.4-0.3 % SOLN Place 1 drop into both eyes daily as needed (for dry eyes).     . potassium chloride SA (KLOR-CON) 20 MEQ tablet TAKE 1 TABLET ONCE DAILY. 60 tablet 1  . Probiotic Product (ALIGN) 4 MG CAPS Take by mouth.    . rosuvastatin (CRESTOR) 40 MG tablet TAKE 1 TABLET ONCE DAILY. 60 tablet 1  . senna (SENOKOT) 8.6 MG tablet Take 1 tablet by mouth 2 (two) times daily.    . SYMBICORT 160-4.5 MCG/ACT inhaler USE 2 PUFFS TWICE DAILY. 10.2 g 11   No current facility-administered medications on file prior to visit.    Past Medical History:  Diagnosis Date  . AAA (abdominal aortic aneurysm) ([place]) LAST ABDOMINAL US 10-20-17 3.3 CM   MONITORED BY DR [name]  . Allergy   . Anxiety   . Arthritis   . Atrial fibrillation ([place])   . Basal cell carcinoma   . Bilateral carotid artery stenosis DUPLEX 12-29-2012  BY DR [facility]   BILATERAL ICA STENOSIS 60-79%  . BPH (benign prostatic hypertrophy)   . Cataract    removed both eyes  . CHF (congestive heart failure) ([place])   . Chronic diastolic heart failure ([place])   . Chronic kidney disease    stage 3, pt unaware  . Colon cancer ([place])   . Complication of anesthesia    had nausea after carotid artery surgery, states the medicine given to help the nausea, made it worse  . Constipation   . COPD (chronic obstructive pulmonary disease) ([place])   . Depression   . Emphysema of lung ([place])   . GERD (gastroesophageal reflux disease)   . Glaucoma BOTH EYES   Dr Gershon Crane  . History of basal cell carcinoma excision   . History of lung cancer  APRIL 2012  SQUAMOUS CELL---- S/P RIGHT LOWER LOBECTOMY AT [facility] --  NO CHEMORADIATION---  NO RECURRENCE    ONCOLOGIST- DR Tressie Stalker  LOV IN [facility] 10-27-2012  . History of pneumothorax    pt unaware  . Hx of adenomatous colonic polyps 2005    X 2; 1 hyperplastic polyp; Dr Olevia Perches  . Hyperlipidemia   . Hypertension   . Impaired fasting glucose 2007   108; A1c5.4%  . Lesion of bladder   . Lung cancer ([place]) 01/25/2012  . Microhematuria   . OSA (obstructive sleep apnea) 08/29/2015   CPAP SET ON 10  . PAD (peripheral artery disease) ([place]) ABI'S  JAN 2014  0.65 ON RIGHT ;  1.04 ON LEFT  . Peripheral vascular disease ([place]) S/P ANGIOPLASTY AND STENTING   FOLLOWED  BY DR  DICKSON  . Pleural effusion on right 11/18/2018   Chronic, noted on CXR  . Sleep apnea    + cpap   . Spinal stenosis Sept. 2015  . Status post placement of implantable loop recorder   . Stroke [facility]) Jul 07, 2013   mini  TIA  . Thoracic aorta atherosclerosis ([place])   . Thyrotoxicosis    amiodarone induced  . Urgency of urination     Past Surgical History:  Procedure Laterality Date  . ANGIO PLASTY     X 4 in legs  . AORTOGRAM  07-27-2002   MILD DIFFUSE ILIAC ARTERY OCCLUSIVE DISEASE /  LEFT RENAL ARTERY 20%/ PATENT LEFT FEM-POP GRAFT/ MILD SFA AND POPLITEAL ARTERY OCCLUSIVE DISEASE W/ SEVERE KIDNEY OCCLUSIVE DISEASE  . ATRIAL FIBRILLATION ABLATION N/A 04/09/2019   Procedure: ATRIAL FIBRILLATION ABLATION;  Surgeon: Thompson Grayer, MD;  Location: [place] CV LAB;  Service: Cardiovascular;  Laterality: N/A;  . ATRIAL FIBRILLATION ABLATION N/A 02/12/2020   Procedure: ATRIAL FIBRILLATION ABLATION;  Surgeon: Thompson Grayer, MD;  Location: [place] CV LAB;  Service: Cardiovascular;  Laterality: N/A;  . BASAL CELL CARCINOMA EXCISION     MULTIPLE TIMES--  RIGHT FOREARM, CHEEKS, AND BACK  . BIOPSY  06/25/2018   Procedure: BIOPSY;  Surgeon: Rush Landmark Telford Nab., MD;  Location: [place];  Service: Gastroenterology;;  .  CARDIOVASCULAR STRESS TEST  12-08-2012  DR MCLEAN   NORMAL LEXISCAN WITH NO EXERCISE NUCLEAR STUDY/ EF 66%/   NO ISCHEMIA/ NO SIGNIFICANT CHANGE FROM PRIOR STUDY  . CARDIOVERSION N/A 02/14/2018   Procedure: CARDIOVERSION;  Surgeon: Larey Dresser, MD;  Location: [facility] ENDOSCOPY;  Service: Cardiovascular;  Laterality: N/A;  . CAROTID ANGIOGRAM N/A 07/10/2013   Procedure: CAROTID ANGIOGRAM;  Surgeon: Elam Dutch, MD;  Location: [facility] CATH LAB;  Service: Cardiovascular;  Laterality: N/A;  . CAROTID ENDARTERECTOMY Right 07-14-13   cea  . CATARACT EXTRACTION W/ INTRAOCULAR LENS  IMPLANT, BILATERAL    . colon polyectomy    . COLONOSCOPY    . COLONOSCOPY WITH PROPOFOL N/A 06/25/2018   Procedure: COLONOSCOPY WITH PROPOFOL;  Surgeon: Rush Landmark Telford Nab., MD;  Location: [place];  Service: Gastroenterology;  Laterality: N/A;  . CYSTOSCOPY W/ RETROGRADES Bilateral 01/21/2013   Procedure: CYSTOSCOPY WITH RETROGRADE PYELOGRAM;  Surgeon: Molli Hazard, MD;  Location: [facility];  Service: Urology;  Laterality: Bilateral;   CYSTO, BLADDER BIOPSY, BILATERAL RETROGRADE PYELOGRAM  RAD TECH FROM RADIOLOGY PER JOY  . CYSTOSCOPY WITH BIOPSY N/A 01/21/2013   Procedure: CYSTOSCOPY WITH BIOPSY;  Surgeon: Molli Hazard, MD;  Location[facility]  Service: Urology;  Laterality: N/A;  . ENDARTERECTOMY Right 07/14/2013   Procedure: ENDARTERECTOMY CAROTID;  Surgeon: Angelia Mould, MD;  Location: [place];  Service: Vascular;  Laterality: Right;  . EP IMPLANTABLE DEVICE N/A 08/12/2015   Procedure: Loop Recorder Insertion;  Surgeon: Thompson Grayer, MD;  Location: [place] CV LAB;  Service: Cardiovascular;  Laterality: N/A;  . ESOPHAGOGASTRODUODENOSCOPY (EGD) WITH PROPOFOL N/A 06/25/2018   Procedure: ESOPHAGOGASTRODUODENOSCOPY (EGD) WITH PROPOFOL;  Surgeon: Rush Landmark Telford Nab., MD;  Location: [place];  Service: Gastroenterology;  Laterality: N/A;  .  ESOPHAGOGASTRODUODENOSCOPY (EGD) WITH PROPOFOL N/A 07/10/2018   Procedure: ESOPHAGOGASTRODUODENOSCOPY (EGD) WITH PROPOFOL;  Surgeon: Milus Banister, MD;  Location: [facility] ENDOSCOPY;  Service: Endoscopy;  Laterality: N/A;  . EUS N/A 07/10/2018   Procedure: UPPER ENDOSCOPIC ULTRASOUND (EUS) RADIAL;  Surgeon: Milus Banister, MD;  Location: [facility] ENDOSCOPY;  Service: Endoscopy;  Laterality: N/A;  . EYE SURGERY Right   . FEMORAL-POPLITEAL BYPASS GRAFT Left [facility]   AND 2001  IN [place]  . FEMORAL-POPLITEAL BYPASS GRAFT Left 08/30/2015   Procedure: REVISION OF BYPASS GRAFT Left  FEMORAL-POPLITEAL ARTERY;  Surgeon: Angelia Mould, MD;  Location: [place];  Service: Vascular;  Laterality: Left;  . FINE NEEDLE ASPIRATION N/A 07/10/2018   Procedure: FINE NEEDLE ASPIRATION (FNA) LINEAR;  Surgeon: Milus Banister, MD;  Location: [facility] ENDOSCOPY;  Service: Endoscopy;  Laterality: N/A;  . FLEXIBLE SIGMOIDOSCOPY N/A 12/12/2018   Procedure: FLEXIBLE SIGMOIDOSCOPY;  Surgeon: Ileana Roup, MD;  Location: [facility] ORS;  Service: General;  Laterality: N/A;  . LARYNGOSCOPY  06-27-2004   BX VOCAL CORD  (LEUKOPLAKIA)  PER PT NO ISSUES SINCE  . LEFT HEART CATH AND CORONARY ANGIOGRAPHY N/A 08/20/2017   Procedure: LEFT HEART CATH AND CORONARY ANGIOGRAPHY;  Surgeon: Larey Dresser, MD;  Location: [place] CV LAB;  Service: Cardiovascular;  Laterality: N/A;  . LOOP RECORDER INSERTION N/A 04/09/2019   Procedure: LOOP RECORDER INSERTION;  Surgeon: Thompson Grayer, MD;  Location: [place] CV LAB;  Service: Cardiovascular;  Laterality: N/A;  . LOOP RECORDER REMOVAL N/A 04/09/2019   Procedure: LOOP RECORDER REMOVAL;  Surgeon: Thompson Grayer, MD;  Location: [place] CV LAB;  Service: Cardiovascular;  Laterality: N/A;  . LOWER EXTREMITY ANGIOGRAM Bilateral 08/29/2015   Procedure: Lower Extremity Angiogram;  Surgeon: Angelia Mould, MD;  Location: [place] [facility] LAB;  Service: Cardiovascular;  Laterality:  Bilateral;  . LUNG LOBECTOMY  01/24/2011    RIGHT UPPER LOBE  (SQUAMOUS CELL CARCINOMA) Dr Dorthea Cove , [facility]. No chemotherapyor radiation  . PATCH ANGIOPLASTY Right 07/14/2013   Procedure: PATCH ANGIOPLASTY;  Surgeon: Angelia Mould, MD;  Location: [place];  Service: Vascular;  Laterality: Right;  . PATCH ANGIOPLASTY Left 08/30/2015   Procedure: VEIN PATCH ANGIOPLASTY OF PROXIMAL Left BYPASS GRAFT;  Surgeon: Angelia Mould, MD;  Location: [place];  Service: Vascular;  Laterality: Left;  . PERIPHERAL VASCULAR CATHETERIZATION N/A 08/29/2015   Procedure: Abdominal Aortogram;  Surgeon: Angelia Mould, MD;  Location: [place] CV LAB;  Service: Cardiovascular;  Laterality: N/A;  . POLYPECTOMY  06/25/2018   Procedure: POLYPECTOMY;  Surgeon: Rush Landmark Telford Nab., MD;  Location: [place];  Service: Gastroenterology;;  . POLYPECTOMY    . SUBMUCOSAL INJECTION  06/25/2018   Procedure: SUBMUCOSAL INJECTION;  Surgeon: Rush Landmark Telford Nab., MD;  Location: [place];  Service: Gastroenterology;;  . TEE WITHOUT CARDIOVERSION N/A 02/14/2018   Procedure: TRANSESOPHAGEAL ECHOCARDIOGRAM (TEE);  Surgeon: Larey Dresser, MD;  Location: [facility] ENDOSCOPY;  Service: Cardiovascular;  Laterality: N/A;  . trabecular surgery     OS  . TRANSTHORACIC ECHOCARDIOGRAM  12-29-2012  DR [facility]   MILD LVH/  LVSF NORMAL/ EF 26-37%/  GRADE I DIASTOLIC DYSFUNCTION    Social History   Socioeconomic History  . Marital status: Married    Spouse name: [name]   . Number of children: 3  . Years of education: 12+  . Highest education level: Not on file  Occupational History  . 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 Smoking status: Former Smoker    Packs/day: 0.50    Years: 50.00    Pack years: 25.00    Types: Cigarettes    Quit date: 07/28/2014  Years since quitting:  6.5  . Smokeless tobacco: Never Used  Vaping Use  . Vaping Use: Never used  Substance and Sexual Activity  . Alcohol use: Yes    Alcohol/week: 4.0 standard drinks    Types: 1 Glasses of wine, 1 Cans of beer, 1 Shots of liquor, 1 Standard drinks or equivalent per week    Comment:  socially, variable  . Drug use: No  . Sexual activity: Not on file  Other Topics Concern  . Not on file  Social History Narrative   Patient lives at home with spouse [name]   Patient has 3 children    Patient is right handed    Social Determinants of Health   Financial Resource Strain: Low Risk   . Difficulty of Paying Living Expenses: Not hard at all  Food Insecurity: No Food Insecurity  . Worried About Charity fundraiser in the Last Year: Never true  . Ran Out of Food in the Last Year: Never true  Transportation Needs: No Transportation Needs  . Lack of Transportation (Medical): No  . Lack of Transportation (Non-Medical): No  Physical Activity: Insufficiently Active  . Days of Exercise per Week: 3 days  . Minutes of Exercise per Session: 30 min  Stress: No Stress Concern Present  . Feeling of Stress : Not at all  Social Connections: Not on file    Family History  Problem Relation Age of Onset  . Stroke Mother        mini strokes  . Alcohol abuse Father   . Heart disease Father        MI after 2  . Stroke Father   . Hypertension Father   . Heart attack Father   . Heart disease Paternal Aunt        several  . Hypertension Paternal Aunt        several  . Stroke Paternal Aunt        several  . Stroke Paternal Uncle        several  . Heart disease Paternal Uncle        several;2 had MI pre 80  . Cancer Daughter 35       breast ca, also with benign sessile polyp   . Colon cancer Daughter 16  . Colon polyps Neg Hx   . Esophageal cancer Neg Hx   . Rectal cancer Neg Hx   . Stomach cancer Neg Hx     Review of Systems  Constitutional: Negative for chills and fever.  Respiratory:  Positive for wheezing (occ). Negative for cough and shortness of breath.   Cardiovascular: Negative for chest pain, palpitations and leg swelling.  Gastrointestinal: Negative for abdominal pain.  Neurological: Positive for light-headedness (once). Negative for headaches.  Psychiatric/Behavioral: Positive for dysphoric mood (controlled). The patient is not nervous/anxious.        Objective:   Vitals:   01/30/21 1056  BP: 138/68  Pulse: 60  Temp: 98.4 F (36.9 C)  SpO2: 94%   BP Readings from Last 3 Encounters:  01/30/21 138/68  01/26/21 (!) 116/58  12/29/20 140/78   Wt Readings from Last 3 Encounters:  01/30/21 201 lb 6.4 oz (91.4 kg)  01/26/21 203 lb (92.1 kg)  12/29/20 201 lb (91.2 kg)   Body mass index is 26.21 kg/m.   Physical Exam    Constitutional: Appears well-developed and well-nourished. No distress.  HENT:  Head: Normocephalic and atraumatic.  Neck: Neck supple. No tracheal deviation present. No  thyromegaly present.  No cervical lymphadenopathy Cardiovascular: Normal rate, regular rhythm and normal heart sounds.   No murmur heard. No carotid bruit .  No edema Pulmonary/Chest: Effort normal and breath sounds normal. No respiratory distress. No has no wheezes. No rales.  Skin: Skin is warm and dry. Not diaphoretic.  Psychiatric: Normal mood and affect. Behavior is normal.      Assessment & Plan:    See Problem List for Assessment and Plan of chronic medical problems.    This visit occurred during the SARS-CoV-2 public health emergency.  Safety protocols were in place, including screening questions prior to the visit, additional usage of staff PPE, and extensive cleaning of exam room while observing appropriate contact time as indicated for disinfecting solutions.

## 2021-01-30 ENCOUNTER — Encounter: Payer: Self-pay | Admitting: Internal Medicine

## 2021-01-30 ENCOUNTER — Other Ambulatory Visit: Payer: Self-pay

## 2021-01-30 ENCOUNTER — Ambulatory Visit (INDEPENDENT_AMBULATORY_CARE_PROVIDER_SITE_OTHER): Payer: PPO | Admitting: Internal Medicine

## 2021-01-30 VITALS — BP 138/68 | HR 60 | Temp 98.4°F | Ht 73.5 in | Wt 201.4 lb

## 2021-01-30 DIAGNOSIS — N1832 Chronic kidney disease, stage 3b: Secondary | ICD-10-CM | POA: Diagnosis not present

## 2021-01-30 DIAGNOSIS — J449 Chronic obstructive pulmonary disease, unspecified: Secondary | ICD-10-CM | POA: Diagnosis not present

## 2021-01-30 DIAGNOSIS — F3289 Other specified depressive episodes: Secondary | ICD-10-CM

## 2021-01-30 DIAGNOSIS — I10 Essential (primary) hypertension: Secondary | ICD-10-CM | POA: Diagnosis not present

## 2021-01-30 DIAGNOSIS — R7303 Prediabetes: Secondary | ICD-10-CM

## 2021-01-30 NOTE — Assessment & Plan Note (Addendum)
Chronic Lab Results  Component Value Date   HGBA1C 5.9 (A) 07/09/2019   Now on jardiance 10 mg daily Encouraged low sugar / carb diet and staying active Blood work not needed at this time, so will hold off on checking a1c

## 2021-01-30 NOTE — Assessment & Plan Note (Signed)
Chronic BP well controlled Continue norvasc 5 mg qd, losartan 50 mg qd, metoprolol 12.5 mg qd, lasix 40 mg qd cmp

## 2021-01-30 NOTE — Assessment & Plan Note (Signed)
Chronic Controlled, stable Continue Lexapro 10 mg daily

## 2021-01-30 NOTE — Assessment & Plan Note (Signed)
Chronic Controlled, stable Continue Singulair 10 mg nightly and Symbicort 160-4.5 MCG/ACT 2 puffs twice daily

## 2021-01-30 NOTE — Assessment & Plan Note (Signed)
Chronic Stable 

## 2021-02-08 ENCOUNTER — Other Ambulatory Visit: Payer: Self-pay

## 2021-02-08 ENCOUNTER — Encounter (HOSPITAL_COMMUNITY)
Admission: RE | Admit: 2021-02-08 | Discharge: 2021-02-08 | Disposition: A | Discharge status: Home / Self Care | Payer: PPO | Source: Ambulatory Visit | Attending: Oncology | Admitting: Oncology

## 2021-02-08 DIAGNOSIS — C3490 Malignant neoplasm of unspecified part of unspecified bronchus or lung: Secondary | ICD-10-CM | POA: Diagnosis not present

## 2021-02-08 DIAGNOSIS — Z79899 Other long term (current) drug therapy: Secondary | ICD-10-CM | POA: Insufficient documentation

## 2021-02-08 LAB — GLUCOSE, CAPILLARY: Glucose-Capillary: 141 mg/dL — ABNORMAL HIGH (ref 70–99)

## 2021-02-08 MED ORDER — FLUDEOXYGLUCOSE F - 18 (FDG) INJECTION
10.2000 | Freq: Once | INTRAVENOUS | Status: AC | PRN
  Administered 2021-02-08: 9.9 via INTRAVENOUS

## 2021-02-09 ENCOUNTER — Inpatient Hospital Stay: Payer: PPO

## 2021-02-09 ENCOUNTER — Inpatient Hospital Stay: Payer: PPO | Attending: Oncology | Admitting: Oncology

## 2021-02-09 ENCOUNTER — Telehealth: Payer: Self-pay | Admitting: *Deleted

## 2021-02-09 ENCOUNTER — Encounter: Payer: Self-pay | Admitting: *Deleted

## 2021-02-09 VITALS — BP 140/61 | HR 67 | Temp 98.2°F | Resp 18 | Ht 73.0 in | Wt 201.6 lb

## 2021-02-09 DIAGNOSIS — J449 Chronic obstructive pulmonary disease, unspecified: Secondary | ICD-10-CM | POA: Diagnosis not present

## 2021-02-09 DIAGNOSIS — Z85118 Personal history of other malignant neoplasm of bronchus and lung: Secondary | ICD-10-CM | POA: Diagnosis not present

## 2021-02-09 DIAGNOSIS — I4891 Unspecified atrial fibrillation: Secondary | ICD-10-CM | POA: Diagnosis not present

## 2021-02-09 DIAGNOSIS — R918 Other nonspecific abnormal finding of lung field: Secondary | ICD-10-CM | POA: Insufficient documentation

## 2021-02-09 DIAGNOSIS — C187 Malignant neoplasm of sigmoid colon: Secondary | ICD-10-CM

## 2021-02-09 DIAGNOSIS — Z862 Personal history of diseases of the blood and blood-forming organs and certain disorders involving the immune mechanism: Secondary | ICD-10-CM | POA: Insufficient documentation

## 2021-02-09 DIAGNOSIS — Z7901 Long term (current) use of anticoagulants: Secondary | ICD-10-CM | POA: Diagnosis not present

## 2021-02-09 DIAGNOSIS — C3490 Malignant neoplasm of unspecified part of unspecified bronchus or lung: Secondary | ICD-10-CM | POA: Diagnosis not present

## 2021-02-09 LAB — CEA (ACCESS): CEA (CHCC): 2.29 ng/mL (ref 0.00–5.00)

## 2021-02-09 LAB — CBC WITH DIFFERENTIAL (CANCER CENTER ONLY)
Abs Immature Granulocytes: 0.01 10*3/uL (ref 0.00–0.07)
Basophils Absolute: 0 10*3/uL (ref 0.0–0.1)
Basophils Relative: 0 %
Eosinophils Absolute: 0.1 10*3/uL (ref 0.0–0.5)
Eosinophils Relative: 2 %
HCT: 43.8 % (ref 39.0–52.0)
Hemoglobin: 14.3 g/dL (ref 13.0–17.0)
Immature Granulocytes: 0 %
Lymphocytes Relative: 13 %
Lymphs Abs: 0.6 10*3/uL — ABNORMAL LOW (ref 0.7–4.0)
MCH: 31 pg (ref 26.0–34.0)
MCHC: 32.6 g/dL (ref 30.0–36.0)
MCV: 95 fL (ref 80.0–100.0)
Monocytes Absolute: 0.3 10*3/uL (ref 0.1–1.0)
Monocytes Relative: 6 %
Neutro Abs: 3.8 10*3/uL (ref 1.7–7.7)
Neutrophils Relative %: 79 %
Platelet Count: 248 10*3/uL (ref 150–400)
RBC: 4.61 MIL/uL (ref 4.22–5.81)
RDW: 13.9 % (ref 11.5–15.5)
WBC Count: 4.7 10*3/uL (ref 4.0–10.5)
nRBC: 0 % (ref 0.0–0.2)

## 2021-02-09 LAB — BASIC METABOLIC PANEL - CANCER CENTER ONLY
Anion gap: 9 (ref 5–15)
BUN: 17 mg/dL (ref 8–23)
CO2: 27 mmol/L (ref 22–32)
Calcium: 9.2 mg/dL (ref 8.9–10.3)
Chloride: 104 mmol/L (ref 98–111)
Creatinine: 1.38 mg/dL — ABNORMAL HIGH (ref 0.61–1.24)
GFR, Estimated: 52 mL/min — ABNORMAL LOW (ref >60)
Glucose, Bld: 170 mg/dL — ABNORMAL HIGH (ref 70–99)
Potassium: 4.2 mmol/L (ref 3.5–5.1)
Sodium: 140 mmol/L (ref 135–145)

## 2021-02-09 LAB — CEA (IN HOUSE-CHCC): CEA (CHCC-In House): 1.43 ng/mL (ref 0.00–5.00)

## 2021-02-09 NOTE — Telephone Encounter (Signed)
-----   Message from Ladell Pier, MD sent at 02/09/2021  1:35 PM EDT ----- Please call patient, CEA is normal, follow-up as scheduled

## 2021-02-09 NOTE — Progress Notes (Signed)
Carelink Summary Report / Loop Recorder 

## 2021-02-09 NOTE — Progress Notes (Signed)
[place] OFFICE PROGRESS NOTE   Diagnosis: Non-small cell lung cancer, colon cancer  INTERVAL HISTORY:   Troy [name] returns as scheduled.  No complaint.  Objective:  Vital signs in last 24 hours:  Blood pressure 140/61, pulse 67, temperature 98.2 F (36.8 C), temperature source Oral, resp. rate 18, height $RemoveBe'6\' 1"'tsThOamjR$  (1.854 m), weight 201 lb 9.6 oz (91.4 kg), SpO2 93 %.   Physical examination-not performed today Lab Results:  Lab Results  Component Value Date   WBC 4.7 02/09/2021   HGB 14.3 02/09/2021   HCT 43.8 02/09/2021   MCV 95.0 02/09/2021   PLT 248 02/09/2021   NEUTROABS 3.8 02/09/2021    CMP  Lab Results  Component Value Date   NA 139 01/05/2021   K 4.1 01/05/2021   CL 107 01/05/2021   CO2 24 01/05/2021   GLUCOSE 117 (H) 01/05/2021   BUN 23 01/05/2021   CREATININE 1.40 (H) 01/24/2021   CALCIUM 9.1 01/05/2021   PROT 7.3 12/09/2019   ALBUMIN 3.6 12/09/2019   AST 23 12/09/2019   ALT 22 12/09/2019   ALKPHOS 82 12/09/2019   BILITOT 0.8 12/09/2019   GFRNONAA 39 (L) 01/05/2021   GFRAA 74 05/16/2020    Lab Results  Component Value Date   CEA1 <1.00 08/05/2020    Lab Results  Component Value Date   INR 1.0 12/03/2018    Imaging:  NM PET Image Restag (PS) Skull Base To Thigh  Result Date: 02/08/2021 CLINICAL DATA:  Subsequent treatment strategy for non-small cell lung cancer. RIGHT lower lobectomy 2012. EXAM: NUCLEAR MEDICINE PET SKULL BASE TO THIGH TECHNIQUE: 9.9 mCi F-18 FDG was injected intravenously. Full-ring PET imaging was performed from the skull base to thigh after the radiotracer. CT data was obtained and used for attenuation correction and anatomic localization. Fasting blood glucose: 140 mg/dl COMPARISON:  chest CT 01/24/2021 FINDINGS: Mediastinal blood pool activity: SUV max 1. 2.65 Liver activity: SUV max NA NECK: No hypermetabolic lymph nodes in the neck. Incidental CT findings: none CHEST: Band of collecting nodules in the RIGHT  upper lobe along the fissure are similar similar to recent comparison CT. Nodular portion measures 13 mm on image 82/4. The entire band has a metabolic activity with SUV max equal 3.2 on image 84. Slightly anteriorly second discrete nodule measures 13 mm on image 17/6 with low metabolic activity (SUV max equal 1.4). The nodules are not changed in size from recent CT exam. However the nodularity is increased from more remote scan from 2020. Bands of scarring have present at that time. There is volume loss in the RIGHT hemithorax related to RIGHT lower lobectomy. Small effusion similar prior. No suspicious lesions in LEFT lung. Within the RIGHT upper mediastinum there is soft tissue nodule deep to the SVC measuring 12 mm (57/series 4). This nodule not changed in size over comparison exams however does have metabolic activity SUV max equal 3.5. (Image 57) Incidental CT findings: none ABDOMEN/PELVIS: No abnormal hypermetabolic activity within the liver, pancreas, adrenal glands, or spleen. No hypermetabolic lymph nodes in the abdomen or pelvis. Incidental CT findings: Atherosclerotic calcification of the aorta. SKELETON: No focal hypermetabolic activity to suggest skeletal metastasis. Scattered sclerotic lesions in the LEFT and RIGHT iliac bones without hypermetabolic activity Incidental CT findings: none IMPRESSION: 1. Band of hypermetabolic nodules in the inferior aspect of the RIGHT upper lobe are indeterminate. Nodularity is increased over time which is concerning for malignancy. Recommend tissue sampling to exclude lung cancer recurrence. 2. Hypermetabolic  nodule deep to the SVC in the RIGHT upper mediastinum concerning for malignancy. 3. Scattered calcified lesions in the pelvis without metabolic activity are favored benign 4. Small RIGHT effusion similar prior. Electronically Signed   By: Suzy Bouchard M.D.   On: 02/08/2021 15:15    Medications: I have reviewed the patient's current  medications.   Assessment/Plan: 1.  Adenocarcinoma of sigmoid colon-stage II (T3N0)  Colonoscopy 06/25/2018- multiple polyps in the ascending and descending colon, partially obstructing mass in the sigmoid colon-biopsy confirmed adenocarcinoma  CTs 06/25/2018-enlarging right paratracheal lymph node compared to November 2018, no other evidence of metastatic disease  Low anterior resection 12/12/2018, stage II (T3N0) adenocarcinoma the sigmoid colon, no loss of mismatch repair protein expression, MSI-stable  Colonoscopy 06/23/2020-polyps removed from the transverse colon, ascending colon, and cecum-tubular adenomas   CT chest 01/14/2020-stable mildly enlarged high right paratracheal node and right hilar node  CT abdomen/pelvis 01/29/2020-no evidence of recurrent disease, stable right lower lobe nodule  CT chest 01/24/2021-mild growth of 2 irregular basilar right upper lobe nodules, stable high right mediastinal node  PET scan 02/08/2021- band of nodules in the right upper lobe including a 13 mm nodule with an SUV of 3.2 and a 13 mm nodule with an SUV of 1.4.,  12 mm nodule in the upper right mediastinum with an SUV of 3.5   2.  History of iron deficiency anemia Secondary to bleeding from tumor, exacerbated by anticoagulation with eliquis 3.Stage Ib squamous cell carcinoma of right lower lung s/pbronchoscopy, mediastinoscopy, right thoracoscopic lowerlobectomy 01/24/2011 -Path poorly differentiated squamous cell carcinoma with positive vascular invasion LN stations 4R, 4L, 7, 8, 9, 10, 11, and 12 w/o evidence of malignancy - pT2a,pN0,pMX stage IB  -managed and monitored per Dr. Elenor Quinones at [facility]. Last Ct chest 05/01/18 stable, without concerning evidence of recurrence -CT 06/25/2018- nonspecific small bilateral nodules, enlarging superior right paratracheal node -EUS biopsy of the right paratracheal nodes (several rounded nodes measuring 4 cm in total) on 07/10/2018- negative for malignancy -CT  chest 03/03/2019- stable postoperative appearance of the chest, unchanged mildly prominent right paratracheal lymph node, no evidence of metastatic disease -CT chest 01/14/2020-stable surgical changes in the right chest, stable mild mediastinal and right hilar adenopathy -CT chest 01/24/2021- mild interval growth of irregular nodular opacities in the right upper lobe along the minor fissure  4. COPD 5. Atrial fibrillation 6. Thyrotoxicosis-potentially amiodarone related 7.  Multiple gastric polyps on endoscopy 06/23/2020-1 oozing polyp-hyperplastic polyp, biopsy from the gastric antrum revealed intestinal metaplasia   Disposition: Troy [name] has a remote history of non-small cell lung cancer.  There are slowly enlarging nodules in the right upper lobe with mild metabolic activity.  There is a stable nodule in the right mediastinum which also has mild hypermetabolism.  These areas could represent slow progression of the previously noted non-small cell lung cancer, a new primary lung cancer, or less likely metastatic disease from colon cancer or another primary tumor site.  We discussed the differential diagnosis.  He agrees to a referral to Dr. Valeta Harms to consider a navigation bronchoscopy for biopsies of the mediastinal and right lung nodules.  He will return to the lab for a CEA today.  Troy [name] will return for an office visit in 2 months.  Betsy Coder, MD  02/09/2021  11:57 AM

## 2021-02-09 NOTE — Progress Notes (Signed)
Faxed referral order to Dr. June Leap at [phone] for consideration for navigational bronchoscopy for biopsy of mediastinal and right lung nodules.

## 2021-02-09 NOTE — Telephone Encounter (Signed)
Per Dr.Sherrill, called to make pt aware of message below. Pt was appreciative and verbalized understanding

## 2021-02-10 ENCOUNTER — Telehealth: Payer: Self-pay

## 2021-02-10 NOTE — Telephone Encounter (Signed)
Called patient but he did not answer, call went straight to VM. Will call back later.

## 2021-02-10 NOTE — Telephone Encounter (Signed)
-----   Message from Garner Nash, DO sent at 02/09/2021  6:29 PM EDT ----- Regarding: New consult nodule slot - next available Please schedule the patient with me in my next available new consult nodule slot.  Thanks  Leory Plowman    ----- Message ----- From: Ladell Pier, MD Sent: 02/09/2021  11:25 AM EDT To: Garner Nash, DO  This is an 80 y/o with h/o stage 1 NSCLC in 2012, rt. Lower lobectomy Has slowly enlarging cluster or nodularity in rt. Lung with mild hypermetabolism on PET this week Also stable rt. Mediastinal node with mild metabolism  He has a h/o colon cancer in 2019   Can you see him to for evaluation and f/u of the lung findings?  Thanks,  [employer]

## 2021-02-21 DIAGNOSIS — I5030 Unspecified diastolic (congestive) heart failure: Secondary | ICD-10-CM | POA: Diagnosis not present

## 2021-02-21 DIAGNOSIS — D692 Other nonthrombocytopenic purpura: Secondary | ICD-10-CM | POA: Diagnosis not present

## 2021-02-21 DIAGNOSIS — E261 Secondary hyperaldosteronism: Secondary | ICD-10-CM | POA: Diagnosis not present

## 2021-02-21 DIAGNOSIS — I2510 Atherosclerotic heart disease of native coronary artery without angina pectoris: Secondary | ICD-10-CM | POA: Diagnosis not present

## 2021-02-21 DIAGNOSIS — Z7982 Long term (current) use of aspirin: Secondary | ICD-10-CM | POA: Diagnosis not present

## 2021-02-23 LAB — CUP PACEART REMOTE DEVICE CHECK
Date Time Interrogation Session: 20220521010115
Implantable Pulse Generator Implant Date: 20200709

## 2021-02-26 IMAGING — CT CT CHEST W/ CM
2 of 4 series · 14 of 36 positions shown, 17 images · IV contrast (OMNIPAQUE)
Comparison: 03/03/2019 chest CT.

CLINICAL DATA: Status post right lower lobectomy in 0660 for
squamous cell lung cancer. Additional history of colon cancer.

EXAM:
CT CHEST WITH CONTRAST
TECHNIQUE: Multidetector CT imaging of the chest was performed during
intravenous contrast administration.
CONTRAST:  75mL OMNIPAQUE IOHEXOL 300 MG/ML  SOLN

[Series 2: axial st · axial · 0.88mm/px · z∈[-233,+63]mm · 11 of 174 slices shown, 14 images]
[im 13/174  mediastinal]
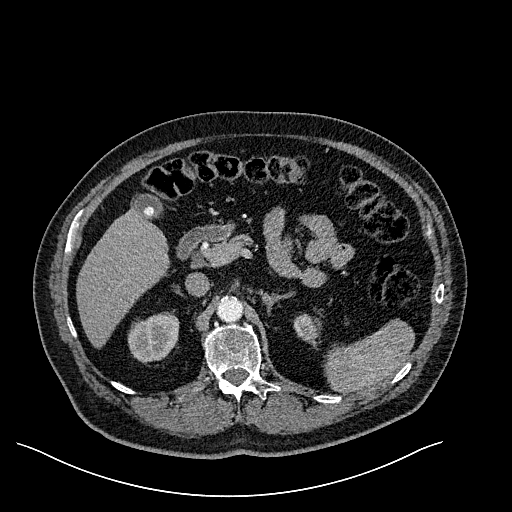
[im 13/174  lung]
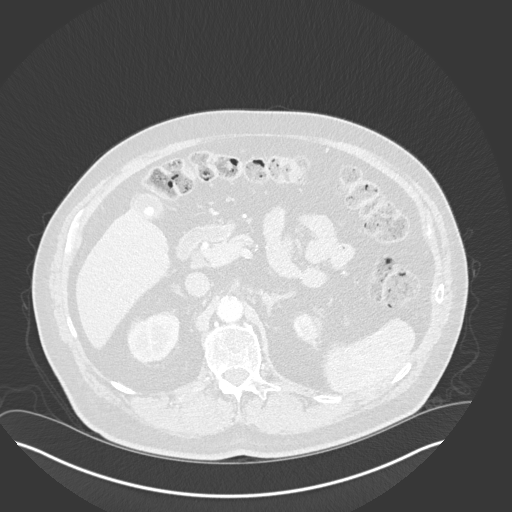
[im 25/174  lung]
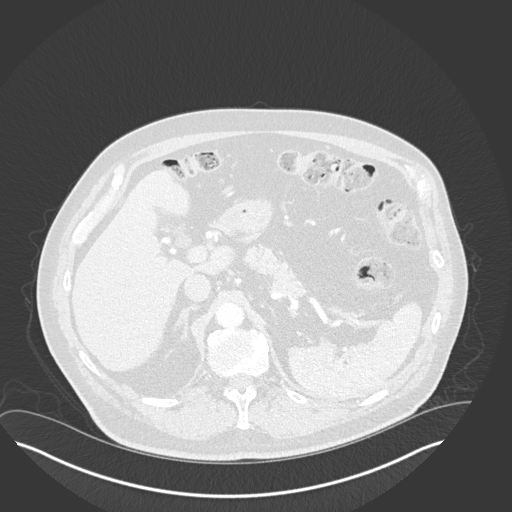
[im 38/174  lung]
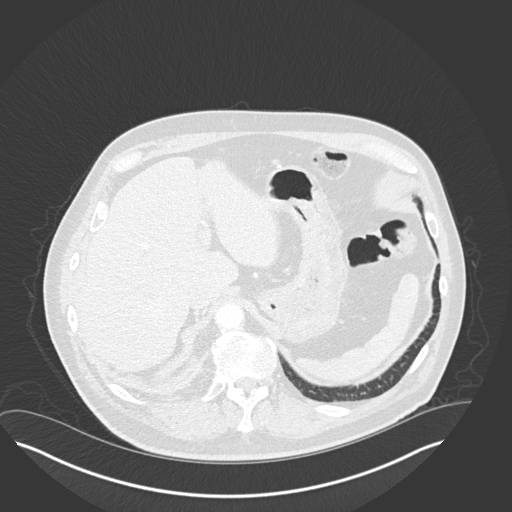
[im 62/174  lung]
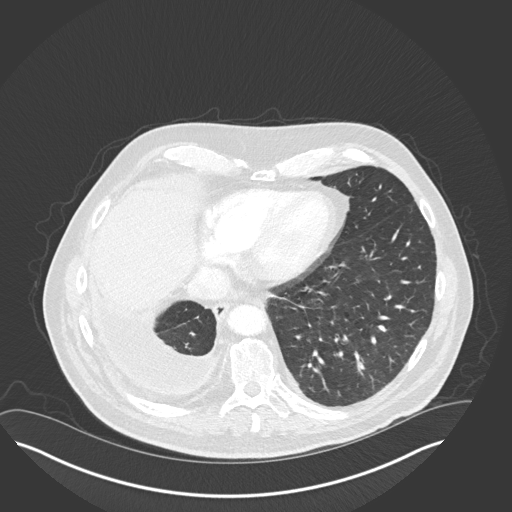
[im 75/174  mediastinal]
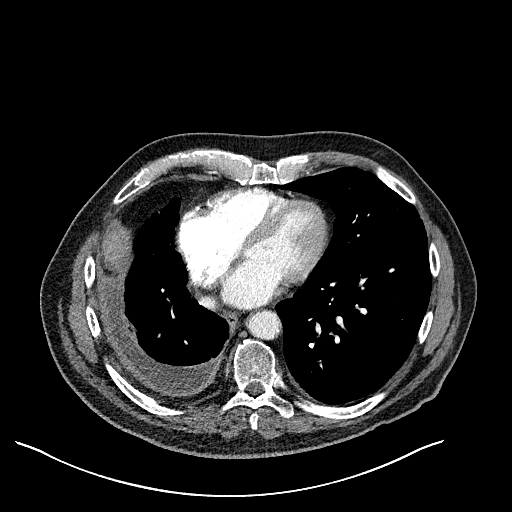
[im 75/174  lung]
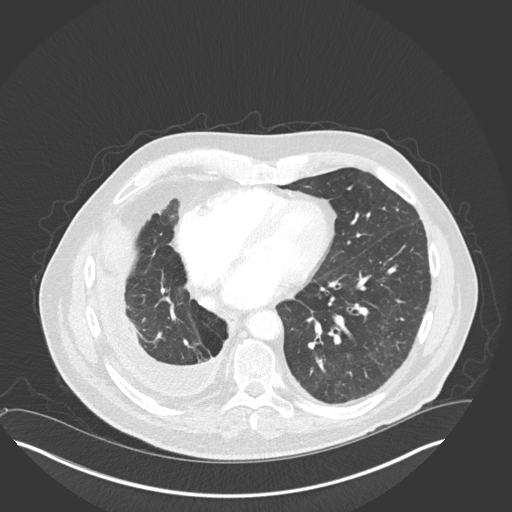
[im 87/174  lung]
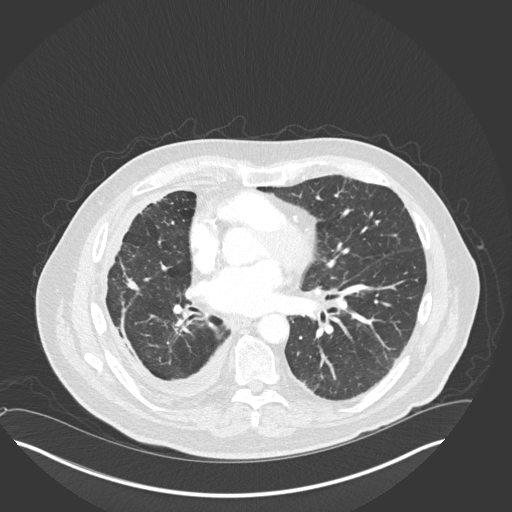
[im 99/174  lung]
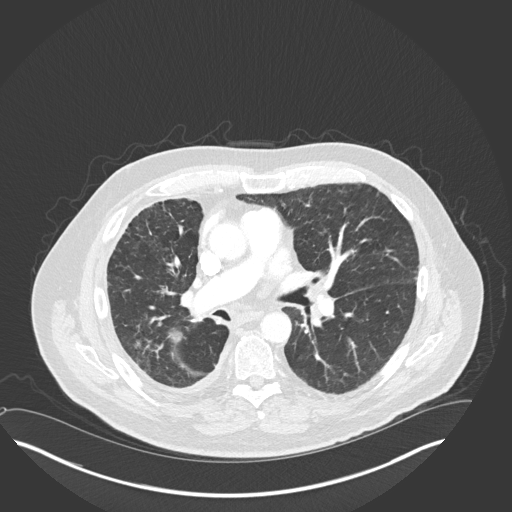
[im 112/174  lung]
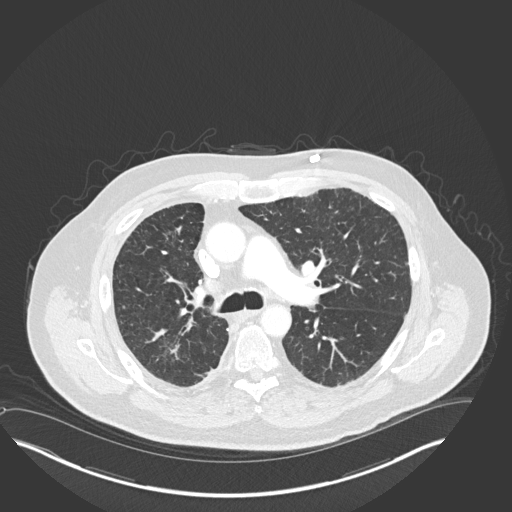
[im 136/174  mediastinal]
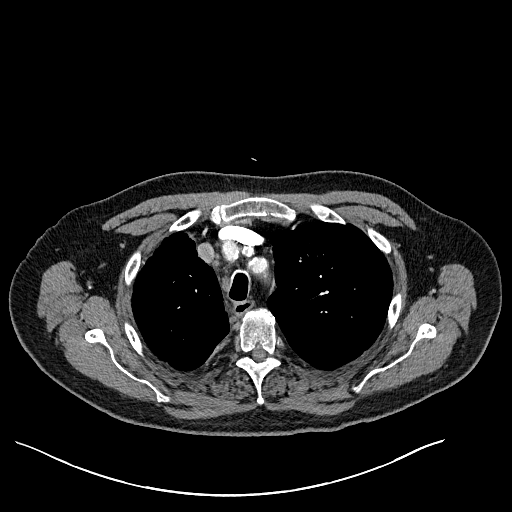
[im 136/174  lung]
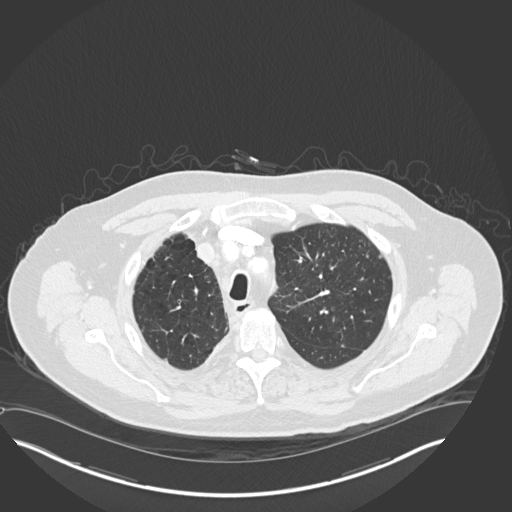
[im 149/174  lung]
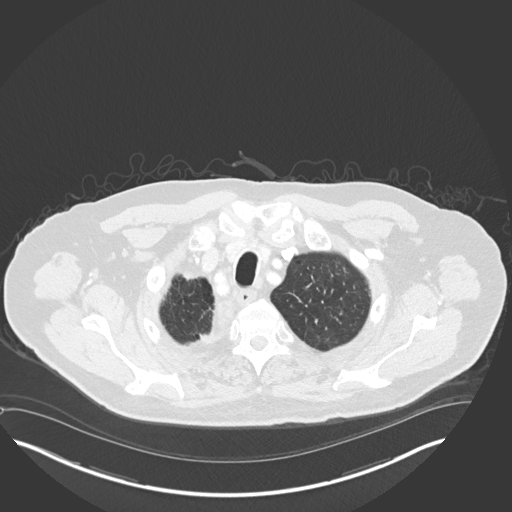
[im 161/174  lung]
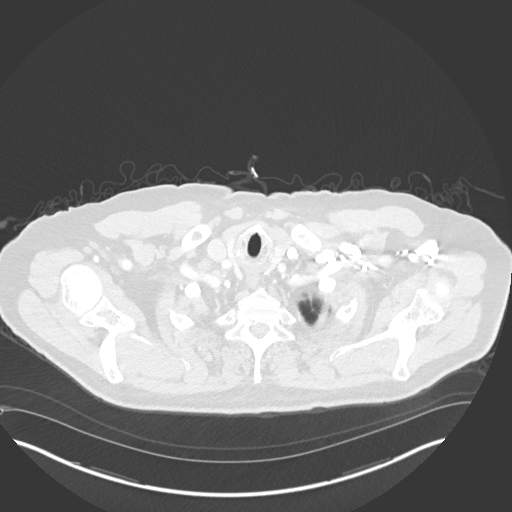

[Series 5: coronal · coronal · 0.79mm/px · 3 of 140 slices shown]
[im 28/140  lung]
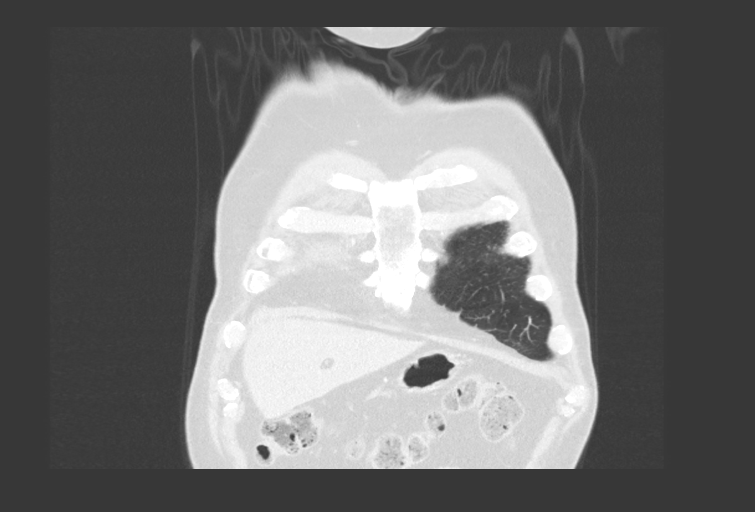
[im 56/140  lung]
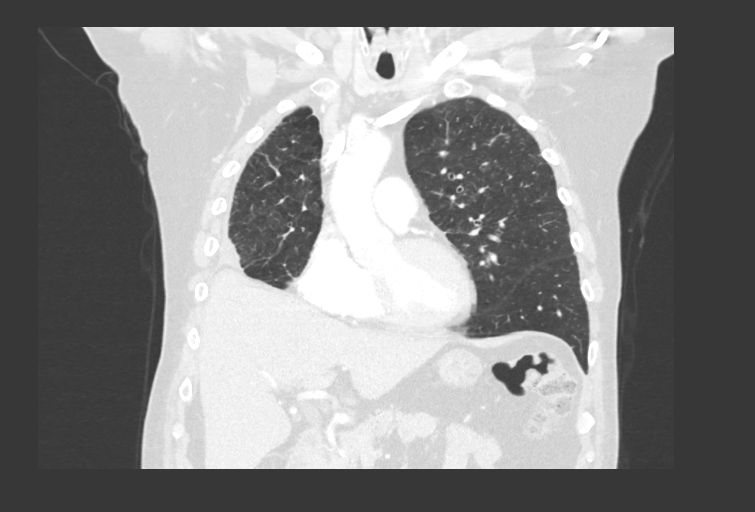
[im 84/140  lung]
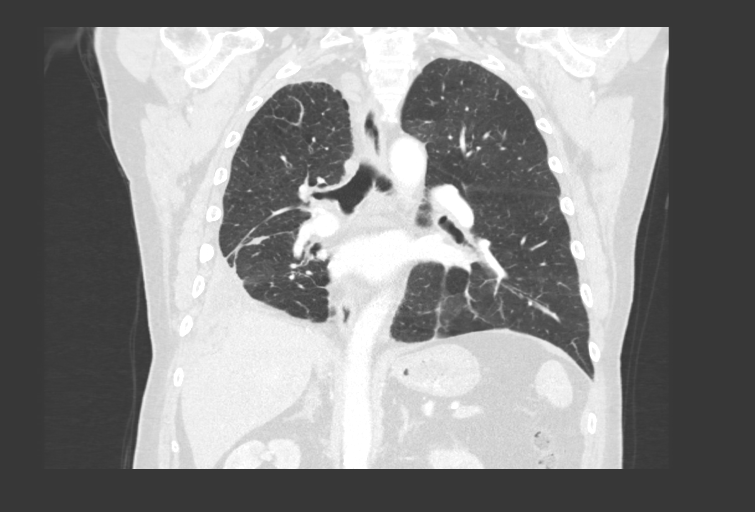

[14 of 36 positions shown; findings below may reference images not displayed]

FINDINGS: Cardiovascular: Normal heart size. No significant pericardial
effusion/thickening. Three-vessel coronary atherosclerosis.
Atherosclerotic nonaneurysmal thoracic aorta. Stable top-normal
caliber 3.4 cm diameter main pulmonary artery. No central pulmonary
emboli.

Mediastinum/Nodes: No discrete thyroid nodules. Unremarkable
esophagus. No axillary adenopathy. Mildly enlarged high right
paratracheal/paraesophageal mediastinal lymph nodes up to the 1.2 cm
(series 2/image 24), stable. No new pathologically enlarged
mediastinal nodes. Mildly enlarged 1.3 cm right hilar node (series
2/image 72), previously 1.3 cm, stable. No new pathologically
enlarged hilar nodes.

Lungs/Pleura: No pneumothorax. Stable diffuse smooth right pleural
thickening with small loculated dependent right pleural effusion. No
left pleural effusion. Moderate centrilobular and paraseptal
emphysema with diffuse bronchial wall thickening. Status post right
lower lobectomy. No acute consolidative airspace disease or lung
masses. Curvilinear parenchymal banding throughout the mid to lower
right lung is similar and compatible with nonspecific
postinfectious/postinflammatory scarring. Mild patchy centrilobular
ground-glass micronodularity throughout both lungs, upper lobe
predominant, not substantially changed back to 08/13/2017
high-resolution chest CT study. No new significant pulmonary
nodules.

Upper abdomen: Cholelithiasis. Small indistinct focus of
hyperenhancement in the posterior right liver lobe (series 2/image
140) remain stable, considered benign.

Musculoskeletal: No aggressive appearing focal osseous lesions.
Subcutaneous loop recorder in the medial ventral left chest wall.
Mild thoracic spondylosis.
IMPRESSION: 1. Stable postsurgical changes in the right hemithorax from right
lower lobectomy with no findings of local tumor recurrence. Stable
loculated small right pleural effusion with diffuse smooth right
pleural thickening.
2. No findings suspicious for metastatic disease in the chest.
Stable mild mediastinal and right hilar adenopathy, most likely
reactive.
3. Mild patchy centrilobular ground-glass micronodularity throughout
both lungs, upper lobe predominant, not substantially changed back
to 08/13/2017 high-resolution chest CT study, most compatible with
smoking related change (respiratory bronchiolitis) or
postinflammatory change.
4. Three-vessel coronary atherosclerosis.
5. Cholelithiasis.
6. Aortic Atherosclerosis ([id number].V) and Emphysema ([id number].H).

## 2021-02-28 ENCOUNTER — Ambulatory Visit (HOSPITAL_COMMUNITY)
Admission: RE | Admit: 2021-02-28 | Discharge: 2021-02-28 | Disposition: A | Discharge status: Home / Self Care | Payer: PPO | Source: Ambulatory Visit | Attending: Cardiology | Admitting: Cardiology

## 2021-02-28 ENCOUNTER — Encounter (HOSPITAL_COMMUNITY): Payer: Self-pay | Admitting: Cardiology

## 2021-02-28 ENCOUNTER — Ambulatory Visit (INDEPENDENT_AMBULATORY_CARE_PROVIDER_SITE_OTHER): Payer: PPO

## 2021-02-28 ENCOUNTER — Other Ambulatory Visit: Payer: Self-pay

## 2021-02-28 VITALS — BP 140/68 | HR 57 | Wt 202.8 lb

## 2021-02-28 DIAGNOSIS — N183 Chronic kidney disease, stage 3 unspecified: Secondary | ICD-10-CM | POA: Insufficient documentation

## 2021-02-28 DIAGNOSIS — I739 Peripheral vascular disease, unspecified: Secondary | ICD-10-CM | POA: Insufficient documentation

## 2021-02-28 DIAGNOSIS — Z87891 Personal history of nicotine dependence: Secondary | ICD-10-CM | POA: Diagnosis not present

## 2021-02-28 DIAGNOSIS — I6521 Occlusion and stenosis of right carotid artery: Secondary | ICD-10-CM | POA: Insufficient documentation

## 2021-02-28 DIAGNOSIS — Z7984 Long term (current) use of oral hypoglycemic drugs: Secondary | ICD-10-CM | POA: Diagnosis not present

## 2021-02-28 DIAGNOSIS — I5032 Chronic diastolic (congestive) heart failure: Secondary | ICD-10-CM

## 2021-02-28 DIAGNOSIS — E785 Hyperlipidemia, unspecified: Secondary | ICD-10-CM | POA: Diagnosis not present

## 2021-02-28 DIAGNOSIS — I714 Abdominal aortic aneurysm, without rupture: Secondary | ICD-10-CM | POA: Insufficient documentation

## 2021-02-28 DIAGNOSIS — I4891 Unspecified atrial fibrillation: Secondary | ICD-10-CM

## 2021-02-28 DIAGNOSIS — Z7901 Long term (current) use of anticoagulants: Secondary | ICD-10-CM | POA: Diagnosis not present

## 2021-02-28 DIAGNOSIS — I480 Paroxysmal atrial fibrillation: Secondary | ICD-10-CM | POA: Diagnosis not present

## 2021-02-28 DIAGNOSIS — I130 Hypertensive heart and chronic kidney disease with heart failure and stage 1 through stage 4 chronic kidney disease, or unspecified chronic kidney disease: Secondary | ICD-10-CM | POA: Insufficient documentation

## 2021-02-28 DIAGNOSIS — Z8673 Personal history of transient ischemic attack (TIA), and cerebral infarction without residual deficits: Secondary | ICD-10-CM | POA: Diagnosis not present

## 2021-02-28 DIAGNOSIS — Z7951 Long term (current) use of inhaled steroids: Secondary | ICD-10-CM | POA: Insufficient documentation

## 2021-02-28 DIAGNOSIS — Z79899 Other long term (current) drug therapy: Secondary | ICD-10-CM | POA: Insufficient documentation

## 2021-02-28 DIAGNOSIS — I2510 Atherosclerotic heart disease of native coronary artery without angina pectoris: Secondary | ICD-10-CM | POA: Insufficient documentation

## 2021-02-28 DIAGNOSIS — C187 Malignant neoplasm of sigmoid colon: Secondary | ICD-10-CM | POA: Diagnosis not present

## 2021-02-28 DIAGNOSIS — Z902 Acquired absence of lung [part of]: Secondary | ICD-10-CM | POA: Diagnosis not present

## 2021-02-28 DIAGNOSIS — R0602 Shortness of breath: Secondary | ICD-10-CM | POA: Insufficient documentation

## 2021-02-28 DIAGNOSIS — Z85118 Personal history of other malignant neoplasm of bronchus and lung: Secondary | ICD-10-CM | POA: Diagnosis not present

## 2021-02-28 DIAGNOSIS — J449 Chronic obstructive pulmonary disease, unspecified: Secondary | ICD-10-CM | POA: Diagnosis not present

## 2021-02-28 MED ORDER — FUROSEMIDE 20 MG PO TABS: 60.0000 mg | ORAL_TABLET | Freq: Two times a day (BID) | ORAL | 11 refills | Status: DC

## 2021-02-28 NOTE — Patient Instructions (Addendum)
EKG done today.  Labs done today. We will contact you only if your labs are abnormal.  INCREASE Lasix to 60mg  (3 tablets) by mouth daily.  No other medication changes were made. Please continue all current medications as prescribed.  Your physician recommends that you schedule a follow-up appointment in: 10 days for a lab only appointment and in 6 weeks for an appointment with our [facility] here in our office.  If you have any questions or concerns before your next appointment please send [name] a message through [place] or call our office at [phone].    TO LEAVE A MESSAGE FOR THE NURSE SELECT OPTION 2, PLEASE LEAVE A MESSAGE INCLUDING: . YOUR NAME . DATE OF BIRTH . CALL BACK NUMBER . REASON FOR CALL**this is important as we prioritize the call backs  YOU WILL RECEIVE A CALL BACK THE SAME DAY AS LONG AS YOU CALL BEFORE 4:00 PM   Do the following things EVERYDAY: 1) Weigh yourself in the morning before breakfast. Write it down and keep it in a log. 2) Take your medicines as prescribed 3) Eat low salt foods--Limit salt (sodium) to 2000 mg per day.  4) Stay as active as you can everyday 5) Limit all fluids for the day to less than 2 liters   At the [facility], you and your health needs are our priority. As part of our continuing mission to provide you with exceptional heart care, we have created designated Provider Care Teams. These Care Teams include your primary Cardiologist (physician) and Advanced Practice Providers (APPs- Physician Assistants and Nurse Practitioners) who all work together to provide you with the care you need, when you need it.   You may see any of the following providers on your designated Care Team at your next follow up: [name] Dr Glori Bickers . Dr [name] . Darrick Grinder, NP . Lyda Jester, PA . Audry Riles, PharmD   Please be sure to bring in all your medications bottles to every appointment.

## 2021-03-01 ENCOUNTER — Ambulatory Visit: Payer: PPO | Admitting: Pulmonary Disease

## 2021-03-01 ENCOUNTER — Encounter (HOSPITAL_COMMUNITY): Payer: Self-pay

## 2021-03-01 ENCOUNTER — Encounter: Payer: Self-pay | Admitting: Pulmonary Disease

## 2021-03-01 VITALS — BP 150/70 | HR 73 | Temp 97.3°F | Ht 73.0 in | Wt 201.0 lb

## 2021-03-01 DIAGNOSIS — J449 Chronic obstructive pulmonary disease, unspecified: Secondary | ICD-10-CM | POA: Diagnosis not present

## 2021-03-01 DIAGNOSIS — Z85038 Personal history of other malignant neoplasm of large intestine: Secondary | ICD-10-CM

## 2021-03-01 DIAGNOSIS — Z85118 Personal history of other malignant neoplasm of bronchus and lung: Secondary | ICD-10-CM

## 2021-03-01 DIAGNOSIS — R911 Solitary pulmonary nodule: Secondary | ICD-10-CM | POA: Diagnosis not present

## 2021-03-01 DIAGNOSIS — R918 Other nonspecific abnormal finding of lung field: Secondary | ICD-10-CM | POA: Diagnosis not present

## 2021-03-01 DIAGNOSIS — R942 Abnormal results of pulmonary function studies: Secondary | ICD-10-CM

## 2021-03-01 NOTE — Progress Notes (Signed)
Patient ID: Troy Mcwherter [name], male   DOB: December 21, 1940, 80 y.o.   MRN: [id number] PCP: Dr. Quay Burow Cardiology: [name]  80 y.o. with history of HTN, COPD, active smoking/COPD, carotid stenosis s/p right CEA, paroxysmal atrial fibrillation, and PAD.   He does not have known obstructive CAD but is at high risk for CAD based on his comorbidities.  Lexiscan Cardiolite in 3/14 showed no ischemia or infarction and echo in 3/14 showed normal EF.  He had a left fem-pop bypass at the [facility] in the '90s.  He is followed at [facility] for PAD. He has chronic right calf/thigh/buttocks claudication that is unchanged over the last few years and follows regularly at [facility].  He has had right lower lobectomy for lung cancer.  He continues to stay off cigarettes.    At a prior appointment, he was in atrial fibrillation but did not realize it. Dr Aundra Dubin  started him on Eliquis and diltiazem CD, and he spontaneously converted to NSR.  In 5/15, he was at [facility] and felt "strange" one day: fatigued, weak, short of breath.  He went to the ER and was in atrial fibrillation with HR in 80s-90s.  He spontaneously converted to NSR in the ER.  He felt back to normal after converting to NSR.  Started on Multaq 400 mg bid.  With CHF, he was eventually transitioned over to amiodarone.   He had patch angioplasty revision of left fem-pop bypass in 11/16.  Now with minimal claudication.  He follows with [facility].     He had a Cardiolite and echo in 7/17 that were unremarkable.  Repeat Cardiolite in 12/17 showed no ischemia, echo was uninterpretable.  Cardiac MRI was therefore done in 1/18, showing EF 66% with normal-appearing RV, no late gadolinium enhancement.   Given increased exertional dyspnea and a defect on Cardiolite, he had LHC in 11/18.  This showed no obstructive CAD. He had a chest CT done given concern for possible amiodarone lung toxicity with increased dyspnea. This showed emphysema, no ILD.    In 5/19, he developed a  probable viral syndrome with dehydration and had a presyncopal episode.  He was orthostatic in the hospital.  He was noted to be in atrial fibrillation this admission which remained persistent.  He had a TEE-guided DCCV back to NSR.   In 9/19, he was admitted with GI bleeding and found to have a sigmoid mass on colonoscopy, path showed sigmoid adenocarcinoma.  He was noted to have right paratracheal lymphadenopathy but EUS with biopsy in 10/19 did not show malignancy.  He had surgical resection of colon cancer.   Amiodarone was stopped with elevated ESR and increased dyspnea.  He was also found to have hyperthyroidism. He was treated with methimazole by endocrinology.    He had atrial fibrillation ablation in 7/20.   Echo in 4/21 showed EF 60-65%, normal RV.    In 5/21, due to high atrial fibrillation burden, he had redo atrial fibrillation ablation.   Cardiolite in 6/21 showed EF 70%, no ischemia/infarction.   He returns today for followup of CHF.  He is in NSR today.  Weight is up 1 lb.  He has shortness of breath that waxes and wanes, some days worse than others.  Generally able to walk in his driveway for exercise and dose ok for short distances on flat ground.  No chest pain.  No palpitations.  No orthopnea/PND.  Patient has been noted to have an enlarging RUL nodule that was  mildly hypermetabolic on PET, he will be seeing Dr. Valeta Harms for bronchoscopy and biopsy.   ECG (personally reviewed): NSR, nonspecific T wave flattening  Labs (3/13): LDL 75, HDL 35, K 4.1, creatinine 1.1 Labs (3/14): LDL 91, HDL 27 Labs (10/14): K 4.3, creatinine 0.98 Labs (4/15): K 4.9, creatinine 1.1, LDL 76, HDL 30 Labs (5/15): K 4.3, creatinine 1.7, BNP 251 Labs (6/15): K 4.4, creatinine 1.3 Labs (10/15): TSH normal Labs (5/16): K 4.2, creatinine 1.12, HCT 39.5, LFTs normal, LDL 84, HDL 43, TSH normal Labs (11/16): creatinine 0.94 Labs (4/17): LDL 39, HDL 39 Labs (6/17): K 4.5, creatinine 1.2 Labs (9/17): K  4, creatinine 1.25, HCT 38.7 Labs (12/17): K 4.5, creatinine 1.49 => 1.62, BNP 43 Labs (2/18): K 3.8, creatinine 1.33, LFTs normal, TSH normal Labs (6/18): K 4.3, creatinine 1.49, LFTs normal, TSH normal, hgb 12.3 Labs (11/18): ESR 60, TSH normal, LFTs normal, creatinine 1.34 Labs (1/19): LDL 50 Labs (5/19): K 4.6, creatinine 1.55, LFTs normal, hgb 12.7, LFTs normal Labs (10/19): K 4 => 4.1, creatinine 1.39 => 1.43, BNP 279, hgb 10.7, TSH low, free T4 and free T3 low, ESR 108 Labs (3/21): K 4.5, creatinine 1.19, TSH normal Labs (4/21): K 4.8, creatinine 1.28, BNP 524, LDL 59 Labs (7/21): hgb 11.5, creatinine 1.4 Labs (8/21): K 4.3, creatinine 1.09 Labs (9/21): hgb 12.7 Labs (2/22): K 5, creatinine 1.54 Labs (5/22): K 4.2, creatinine 1.38, hgb 14.3, LDL 31  PMH: 1. HTN: Fatigue and cough with ramipril use.  2. COPD: Quit smoking 2014. PFTs (1/18) with moderately severe COPD.  - CT chest (11/18): Emphysema noted, no ILD.  3. AAA: CT 1/13 with 3.0 cm AAA.  Abdominal US (1/14) with 3.4 cm AAA. Abdominal US (4/15) with 3.25 x 3.27 AAA. Abdominal US (3/16) with 3.7 cm AAA.  Followed at [facility].  - Abd [name] (1/19): 3.3 cm AAA. 4. Squamous cell lung cancer diagnosed 2/12.  Had right lower lobectomy in 4/12.  5. Hyperlipidemia 6. PAD: Left fem-pop bypass 1994.  ABIs (2/12) 0.62 on right, 0.95 on left. ABIs (1/14): 0.65 on right, 1.04 on left. ABIs (4/15) 0.66 right, 0.87 left.  ABIs (3/16) 0.6 right, 0.88 left.  Patch angioplasty left fem-pop bypass in 11/16.   - Left fem-pop bypass patent on doppler evaluation in 12/17.  - ABIs (2/21): normal on left (1.02), moderately decreased on right (0.54).  7. CAD: Stress myoview 2004 was normal. Lexiscan Cardiolite (3/14) with EF 66%, no ischemia or infarction. Lexiscan Cardiolite (7/17) with EF 61%, no ischemia/infarction.  - Cardiolite (12/17) with EF 62%, inferior/inferolateral fixed defect, most likely diaphragmatic attenuation, no ischemia.  - Cardiolite  (9/18) with EF > 65%, fixed inferior defect (attenuation versus infarction), no ischemia.  - LHC (11/18): No obstructive CAD.  - Cardiolite (6/21): EF 70%, no ischemia/infarction 8. Chronic diastolic CHF: Echo (2/91): technically difficult with EF 55-60%, upper normal RV size.  Echo (5/16) with EF 60-65%.  Echo (7/17) with EF 55-60%, normal RV size and systolic function.  - Echo 12/17 with very poor windows, unable to comment on LV or RV function.  - Cardiac MRI (1/18) with EF 66%, normal RV size and systolic function.  - TEE (5/19): EF 55-60%, normal RV size and systolic function.  - Echo (4/21): EF 60-65%, normal RV 9. Carotid stenosis: TIA 10/14.  Carotid dopplers with > 91% RICA, 66-06% LICA.  Patient had right CEA in 10/14. Carotids (4/15) patent right CEA, LICA 00-45% stenosis.  Carotids (10/15) < 40%  LICA, patent right CEA.  - Carotid dopplers (12/17): right CEA ok, < 59% LICA stenosis.  - Carotid dopplers (1/19): Right CEA ok, 7-47% LICA stenosis.  - Carotid dopplers (2/21): Right CEA ok, LICA 18-55% stenosis.  10. Atrial fibrillation: Paroxysmal. First noted after lobectomy in 4/12 (brief), recurrence in 4/15 then in 5/15.  - TEE-guided DCCV for persistent atrial fibrillation in 5/19.  - Atrial fibrillation ablation 7/20.  - Re-do atrial fibrillation ablation 5/21.  11. Spinal stenosis 12. OSA: Using CPAP.  13. Glaucoma 14. Has ILR 15. Colon cancer: Sigmoid adenocarcinoma, s/p surgical resection.  16. 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   SH: Married, lives in [place].  [facility] [place].  Quit smoking in 2014.  Rare ETOH now.   FH: No premature CAD  ROS: All systems reviewed and negative except as per HPI.   Current Outpatient Medications  Medication Sig Dispense Refill  . amLODipine (NORVASC) 5 MG tablet TAKE 1 TABLET ONCE DAILY. 60 tablet 1  . Brinzolamide-Brimonidine (SIMBRINZA) 1-0.2 % SUSP Place 1 drop into both eyes 2 times daily.     [name] docusate sodium (COLACE) 100 MG capsule Take 100 mg by mouth 2 (two) times daily.     [name] ELIQUIS 5 MG TABS tablet TAKE 1 TABLET BY MOUTH TWICE DAILY. 120 tablet 1  . empagliflozin (JARDIANCE) 10 MG TABS tablet Take 1 tablet (10 mg total) by mouth daily before breakfast. 30 tablet 11  . escitalopram (LEXAPRO) 10 MG tablet TAKE 1 TABLET BY MOUTH DAILY. 28 tablet 5  . famotidine (PEPCID) 20 MG tablet Take 20 mg by mouth daily as needed for heartburn.    . latanoprost (XALATAN) 0.005 % ophthalmic solution     . losartan (COZAAR) 25 MG tablet Take 1 tablet (25 mg total) by mouth daily. 90 tablet 3  . Melatonin 10 MG CAPS Take 10 mg by mouth at bedtime.    . metoprolol succinate (TOPROL-XL) 25 MG 24 hr tablet TAKE 1/2 TABLET IN THE EVENING WITH OR IMMEDIATELY FOLLOWING A MEAL 30 tablet 1  . montelukast (SINGULAIR) 10 MG tablet TAKE ONE TABLET AT BEDTIME. 28 tablet 5  . Multiple Vitamins-Minerals (CENTRUM SILVER PO) Take 1 tablet by mouth daily.    [name] omeprazole (PRILOSEC) 20 MG capsule Take 20 mg by mouth daily.    Vladimir Faster Glycol-Propyl Glycol 0.4-0.3 % SOLN Place 1 drop into both eyes daily as needed (for dry eyes).     . potassium chloride SA (KLOR-CON) 20 MEQ tablet TAKE 1 TABLET ONCE DAILY. 60 tablet 1  . Probiotic Product (ALIGN) 4 MG CAPS Take by mouth.    . rosuvastatin (CRESTOR) 40 MG tablet TAKE 1 TABLET ONCE DAILY. 60 tablet 1  . senna (SENOKOT) 8.6 MG tablet Take 1 tablet by mouth 2 (two) times daily.    . SYMBICORT 160-4.5 MCG/ACT inhaler USE 2 PUFFS TWICE DAILY. 10.2 g 11  . furosemide (LASIX) 20 MG tablet Take 3 tablets (60 mg total) by mouth 2 (two) times daily. 120 tablet 11   No current facility-administered medications for this encounter.   Wt Readings from Last 3 Encounters:  03/01/21 91.2 kg (201 lb)  02/28/21 92 kg (202 lb 12.8 oz)  02/09/21 91.4 kg (201 lb 9.6 oz)    BP 140/68 (BP Location: Left Arm, Patient Position: Sitting)   Pulse (!) 57   Wt 92 kg (202 lb 12.8  oz)   SpO2 93%   BMI 26.76 kg/m  General: NAD Neck: JVP 8-9 cm, no thyromegaly or thyroid nodule.  Lungs: Decreased BS right base.  CV: Nondisplaced PMI.  Heart regular S1/S2, no S3/S4, no murmur.  No peripheral edema.  No carotid bruit.  Unable to palpate pedal pulses.  Abdomen: Soft, nontender, no hepatosplenomegaly, no distention.  Skin: Intact without lesions or rashes.  Neurologic: Alert and oriented x 3.  Psych: Normal affect. Extremities: No clubbing or cyanosis.  HEENT: Normal.   Assessment/Plan: 1. Chronic diastolic CHF: Echo in 4/65 with EF 60-65%, normal RV.  NYHA class III symptoms, he looks at least mildly volume overloaded on exam.   - Increase Lasix to 60 mg bid, BMET 10 days.  - Continue Jardiance 10 mg daily.  2. Hyperlipidemia: Continue Crestor, good lipids in 3/22.  3. Carotid stenosis: s/p R CEA.  Dopplers followed at [facility].  4. PAD: s/p patch angioplasty to left fem-pop bypass in 11/16.  Followed at [facility].  Moderately decreased ABI on right in 2/21, but no pain or pedal ulcerations.  Stable mild claudication.  - He is due for followup at [facility], needs appt.  5. AAA: Stable on last [name], followed at [facility].    6. COPD: No longer smoking. PFTs in 1/18 showed moderately severe obstructive defect. CT chest in 11/18 showed emphysema.   7. Atrial fibrillation: S/p atrial fibrillation ablation in 7/20 and redo in 5/21.  He has been primarily in NSR since redo ablation.  He is in NSR today.  - Continue Toprol XL 12.5 mg daily.  - Continue Eliquis 5 mg bid.  - Continue to limit ETOH.  - Continue to use CPAP.  8. CKD: Stage 3.   9. CAD: LHC in 11/18 with nonobstructive disease only.  Cardiolite in 6/21 with no ischemia/infarction. No chest pain.  10. 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  He is no longer on methimazole, follows with endocrinology.     11. Colon cancer: Sigmoid adenocarcinoma, s/p surgical resection. In remission.  12. HTN: Stable.   13.  Pleural effusion: Chronic right pleural effusion, s/p right lower lobectomy.     14. Squamous cell lung cancer: S/p right lower lobectomy.  CT chest recently with enlarging RUL nodule that was mildly hypermetabolic by PET, seeing Dr. Valeta Harms for bronch/biopsy.   Followup 6 wks with NP/PA  [name] 03/01/2021

## 2021-03-01 NOTE — Progress Notes (Signed)
Synopsis: Referred in June 2022 for abnormal CT chest, PCP: By Binnie Rail, MD  Subjective:   PATIENT ID: Troy Kindred [name] GENDER: male DOB: 16-Nov-1940, MRN: [id number]  Chief Complaint  Patient presents with  . Follow-up  . Consult    Sob with exertion, wheezing    Is an 80 year old gentleman, past medical history of atrial fibrillation, AAA, BPH, chronic diastolic heart failure, emphysema, history of lung cancer, April 2012, squamous cell carcinoma status post right lower lobectomy at [facility], no chemo no radiation no recurrence, hyperlipidemia, hypertension.  Patient has no respiratory complaints today.  He is anxious about this slow-growing lesion that has been followed for some time.  He has several within the right chest, none within the left chest.  Nuclear medicine pet imaging on 02/08/2021 completed revealing a bandlike hypermetabolic nodules within the inferior aspect of the right upper lobe that have slowly increased over time this nodularity area concerning for malignancy as well as a hypermetabolic nodule just inferior to the SVC within the right upper mediastinum concerning for malignancy as well as a small nodule 13 mm in size with low-level metabolic uptake SUV of 1.4 less concerning for malignancy.  However due to his history of lung cancer status post lobectomy and colon cancer they have been following these areas closely and medical oncology.   Past Medical History:  Diagnosis Date  . AAA (abdominal aortic aneurysm) ([place]) LAST ABDOMINAL US 10-20-17 3.3 CM   MONITORED BY DR [name]  . Allergy   . Anxiety   . Arthritis   . Atrial fibrillation ([place])   . Basal cell carcinoma   . Bilateral carotid artery stenosis DUPLEX 12-29-2012  BY DR [facility]   BILATERAL ICA STENOSIS 60-79%  . BPH (benign prostatic hypertrophy)   . Cataract    removed both eyes  . CHF (congestive heart failure) ([place])   . Chronic diastolic heart failure ([place])   . Chronic kidney disease    stage  3, pt unaware  . Colon cancer ([place])   . Complication of anesthesia    had nausea after carotid artery surgery, states the medicine given to help the nausea, made it worse  . Constipation   . COPD (chronic obstructive pulmonary disease) ([place])   . Depression   . Emphysema of lung ([place])   . GERD (gastroesophageal reflux disease)   . Glaucoma BOTH EYES   Dr Gershon Crane  . History of basal cell carcinoma excision   . History of lung cancer APRIL 2012  SQUAMOUS CELL---- S/P RIGHT LOWER LOBECTOMY AT [facility] --  NO CHEMORADIATION---  NO RECURRENCE    ONCOLOGIST- DR Tressie Stalker  LOV IN [facility] 10-27-2012  . History of pneumothorax    pt unaware  . Hx of adenomatous colonic polyps 2005    X 2; 1 hyperplastic polyp; Dr Olevia Perches  . Hyperlipidemia   . Hypertension   . Impaired fasting glucose 2007   108; A1c5.4%  . Lesion of bladder   . Lung cancer ([place]) 01/25/2012  . Microhematuria   . OSA (obstructive sleep apnea) 08/29/2015   CPAP SET ON 10  . PAD (peripheral artery disease) ([place]) ABI'S  JAN 2014  0.65 ON RIGHT ;  1.04 ON LEFT  . Peripheral vascular disease ([place]) S/P ANGIOPLASTY AND STENTING   FOLLOWED  BY DR [name]  . Pleural effusion on right 11/18/2018   Chronic, noted on CXR  . Sleep apnea    + cpap   . Spinal stenosis Sept. 2015  .  Status post placement of implantable loop recorder   . Stroke [facility]) Jul 07, 2013   mini  TIA  . Thoracic aorta atherosclerosis ([place])   . Thyrotoxicosis    amiodarone induced  . Urgency of urination      Family History  Problem Relation Age of Onset  . Stroke Mother        mini strokes  . Alcohol abuse Father   . Heart disease Father        MI after 69  . Stroke Father   . Hypertension Father   . Heart attack Father   . Heart disease Paternal Aunt        several  . Hypertension Paternal Aunt        several  . Stroke Paternal Aunt        several  . Stroke Paternal Uncle        several  . Heart disease Paternal Uncle        several;2 had MI pre 56   . Cancer Daughter 53       breast ca, also with benign sessile polyp   . Colon cancer Daughter 34  . Colon polyps Neg Hx   . Esophageal cancer Neg Hx   . Rectal cancer Neg Hx   . Stomach cancer Neg Hx      Past Surgical History:  Procedure Laterality Date  . ANGIO PLASTY     X 4 in legs  . AORTOGRAM  07-27-2002   MILD DIFFUSE ILIAC ARTERY OCCLUSIVE DISEASE /  LEFT RENAL ARTERY 20%/ PATENT LEFT FEM-POP GRAFT/ MILD SFA AND POPLITEAL ARTERY OCCLUSIVE DISEASE W/ SEVERE KIDNEY OCCLUSIVE DISEASE  . ATRIAL FIBRILLATION ABLATION N/A 04/09/2019   Procedure: ATRIAL FIBRILLATION ABLATION;  Surgeon: Thompson Grayer, MD;  Location: [place] CV [facility];  Service: Cardiovascular;  Laterality: N/A;  . ATRIAL FIBRILLATION ABLATION N/A 02/12/2020   Procedure: ATRIAL FIBRILLATION ABLATION;  Surgeon: Thompson Grayer, MD;  Location: [place] [facility];  Service: Cardiovascular;  Laterality: N/A;  . BASAL CELL CARCINOMA EXCISION     MULTIPLE TIMES--  RIGHT FOREARM, CHEEKS, AND BACK  . BIOPSY  06/25/2018   Procedure: BIOPSY;  Surgeon: Rush Landmark Telford Nab., MD;  Location: [place];  Service: Gastroenterology;;  . CARDIOVASCULAR STRESS TEST  12-08-2012  DR MCLEAN   NORMAL LEXISCAN WITH NO EXERCISE NUCLEAR STUDY/ EF 66%/   NO ISCHEMIA/ NO SIGNIFICANT CHANGE FROM PRIOR STUDY  . CARDIOVERSION N/A 02/14/2018   Procedure: CARDIOVERSION;  Surgeon: Larey Dresser, MD;  Location: [facility] ENDOSCOPY;  Service: Cardiovascular;  Laterality: N/A;  . CAROTID ANGIOGRAM N/A 07/10/2013   Procedure: CAROTID ANGIOGRAM;  Surgeon: Elam Dutch, MD;  Location: [facility] CATH LAB;  Service: Cardiovascular;  Laterality: N/A;  . CAROTID ENDARTERECTOMY Right 07-14-13   cea  . CATARACT EXTRACTION W/ INTRAOCULAR LENS  IMPLANT, BILATERAL    . colon polyectomy    . COLONOSCOPY    . COLONOSCOPY WITH PROPOFOL N/A 06/25/2018   Procedure: COLONOSCOPY WITH PROPOFOL;  Surgeon: Rush Landmark Telford Nab., MD;  Location: [place];  Service:  Gastroenterology;  Laterality: N/A;  . CYSTOSCOPY W/ RETROGRADES Bilateral 01/21/2013   Procedure: CYSTOSCOPY WITH RETROGRADE PYELOGRAM;  Surgeon: Molli Hazard, MD;  Location: [facility];  Service: Urology;  Laterality: Bilateral;   CYSTO, BLADDER BIOPSY, BILATERAL RETROGRADE PYELOGRAM  RAD TECH FROM RADIOLOGY PER JOY  . CYSTOSCOPY WITH BIOPSY N/A 01/21/2013   Procedure: CYSTOSCOPY WITH BIOPSY;  Surgeon: Molli Hazard, MD;  Location:  [place];  Service: Urology;  Laterality: N/A;  . ENDARTERECTOMY Right 07/14/2013   Procedure: ENDARTERECTOMY CAROTID;  Surgeon: Angelia Mould, MD;  Location: [place];  Service: Vascular;  Laterality: Right;  . EP IMPLANTABLE DEVICE N/A 08/12/2015   Procedure: Loop Recorder Insertion;  Surgeon: Thompson Grayer, MD;  Location: [place] CV LAB;  Service: Cardiovascular;  Laterality: N/A;  . ESOPHAGOGASTRODUODENOSCOPY (EGD) WITH PROPOFOL N/A 06/25/2018   Procedure: ESOPHAGOGASTRODUODENOSCOPY (EGD) WITH PROPOFOL;  Surgeon: Rush Landmark Telford Nab., MD;  Location: [place];  Service: Gastroenterology;  Laterality: N/A;  . ESOPHAGOGASTRODUODENOSCOPY (EGD) WITH PROPOFOL N/A 07/10/2018   Procedure: ESOPHAGOGASTRODUODENOSCOPY (EGD) WITH PROPOFOL;  Surgeon: Milus Banister, MD;  Location: [facility] ENDOSCOPY;  Service: Endoscopy;  Laterality: N/A;  . EUS N/A 07/10/2018   Procedure: UPPER ENDOSCOPIC ULTRASOUND (EUS) RADIAL;  Surgeon: Milus Banister, MD;  Location: [facility] ENDOSCOPY;  Service: Endoscopy;  Laterality: N/A;  . EYE SURGERY Right   . FEMORAL-POPLITEAL BYPASS GRAFT Left [facility]   AND 2001  IN [place]  . FEMORAL-POPLITEAL BYPASS GRAFT Left 08/30/2015   Procedure: REVISION OF BYPASS GRAFT Left  FEMORAL-POPLITEAL ARTERY;  Surgeon: Angelia Mould, MD;  Location: [place];  Service: Vascular;  Laterality: Left;  . FINE NEEDLE ASPIRATION N/A 07/10/2018   Procedure: FINE NEEDLE ASPIRATION (FNA) LINEAR;   Surgeon: Milus Banister, MD;  Location: [facility] ENDOSCOPY;  Service: Endoscopy;  Laterality: N/A;  . FLEXIBLE SIGMOIDOSCOPY N/A 12/12/2018   Procedure: FLEXIBLE SIGMOIDOSCOPY;  Surgeon: Ileana Roup, MD;  Location: [facility] ORS;  Service: General;  Laterality: N/A;  . LARYNGOSCOPY  06-27-2004   BX VOCAL CORD  (LEUKOPLAKIA)  PER PT NO ISSUES SINCE  . LEFT HEART CATH AND CORONARY ANGIOGRAPHY N/A 08/20/2017   Procedure: LEFT HEART CATH AND CORONARY ANGIOGRAPHY;  Surgeon: Larey Dresser, MD;  Location: [place] [facility];  Service: Cardiovascular;  Laterality: N/A;  . LOOP RECORDER INSERTION N/A 04/09/2019   Procedure: LOOP RECORDER INSERTION;  Surgeon: Thompson Grayer, MD;  Location: [place] [facility];  Service: Cardiovascular;  Laterality: N/A;  . LOOP RECORDER REMOVAL N/A 04/09/2019   Procedure: LOOP RECORDER REMOVAL;  Surgeon: Thompson Grayer, MD;  Location: [place] CV LAB;  Service: Cardiovascular;  Laterality: N/A;  . LOWER EXTREMITY ANGIOGRAM Bilateral 08/29/2015   Procedure: Lower Extremity Angiogram;  Surgeon: Angelia Mould, MD;  Location: [place] [facility];  Service: Cardiovascular;  Laterality: Bilateral;  . LUNG LOBECTOMY  01/24/2011    RIGHT UPPER LOBE  (SQUAMOUS CELL CARCINOMA) Dr Dorthea [place]ove , [facility]. No chemotherapyor radiation  . PATCH ANGIOPLASTY Right 07/14/2013   Procedure: PATCH ANGIOPLASTY;  Surgeon: Angelia Mould, MD;  Location: [place];  Service: Vascular;  Laterality: Right;  . PATCH ANGIOPLASTY Left 08/30/2015   Procedure: VEIN PATCH ANGIOPLASTY OF PROXIMAL Left BYPASS GRAFT;  Surgeon: Angelia Mould, MD;  Location: [place];  Service: Vascular;  Laterality: Left;  . PERIPHERAL VASCULAR CATHETERIZATION N/A 08/29/2015   Procedure: Abdominal Aortogram;  Surgeon: Angelia Mould, MD;  Location: [place] [facility];  Service: Cardiovascular;  Laterality: N/A;  . POLYPECTOMY  06/25/2018   Procedure: POLYPECTOMY;  Surgeon: Rush Landmark Telford Nab., MD;  Location: [place];  Service: Gastroenterology;;  . POLYPECTOMY    . SUBMUCOSAL INJECTION  06/25/2018   Procedure: SUBMUCOSAL INJECTION;  Surgeon: Rush Landmark Telford Nab., MD;  Location: [place];  Service: Gastroenterology;;  . TEE WITHOUT CARDIOVERSION N/A 02/14/2018   Procedure: TRANSESOPHAGEAL ECHOCARDIOGRAM (TEE);  Surgeon: Aundra Dubin,  Elby Showers, MD;  Location: [place] ENDOSCOPY;  Service: Cardiovascular;  Laterality: N/A;  . trabecular surgery     OS  . TRANSTHORACIC ECHOCARDIOGRAM  12-29-2012  DR [facility]   MILD LVH/  LVSF NORMAL/ EF 56-81%/  GRADE I DIASTOLIC DYSFUNCTION    Social History   Socioeconomic History  . Marital status: Married    Spouse name: [name]   . Number of children: 3  . Years of education: 12+  . Highest education level: Not on file  Occupational History  . 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 Smoking status: Former Smoker    Packs/day: 0.50    Years: 50.00    Pack years: 25.00    Types: Cigarettes    Quit date: 07/28/2014    Years since quitting: 6.5  . Smokeless tobacco: Never Used  Vaping Use  . Vaping Use: Never used  Substance and Sexual Activity  . Alcohol use: Yes    Alcohol/week: 4.0 standard drinks    Types: 1 Glasses of wine, 1 Cans of beer, 1 Shots of liquor, 1 Standard drinks or equivalent per week    Comment:  socially, variable  . Drug use: No  . Sexual activity: Not on file  Other Topics Concern  . Not on file  Social History Narrative   Patient lives at home with spouse [name]   Patient has 3 children    Patient is right handed    Social Determinants of Health   Financial Resource Strain: Low Risk   . Difficulty of Paying Living Expenses: Not hard at all  Food Insecurity: No Food Insecurity  . Worried About Charity fundraiser in the Last Year: Never true  . Ran Out of Food in the Last Year: Never true   Transportation Needs: No Transportation Needs  . Lack of Transportation (Medical): No  . Lack of Transportation (Non-Medical): No  Physical Activity: Insufficiently Active  . Days of Exercise per Week: 3 days  . Minutes of Exercise per Session: 30 min  Stress: No Stress Concern Present  . Feeling of Stress : Not at all  Social Connections: Not on file  Intimate Partner Violence: Not on file     Allergies  Allergen Reactions  . Niacin-Lovastatin Er Shortness Of Breath and Other (See Comments)    Dyspnea, flushing  . Penicillins Hives, Shortness Of Breath and Other (See Comments)    Flushing  & dyspnea Because of a history of documented adverse serious drug reaction;Medi Alert bracelet  is recommended PCN reaction causing immediate rash, facial/tongue/throat swelling, SOB or lightheadedness with hypotension: Yes PCN reaction causing severe rash involving mucus membranes or skin necrosis: No PCN reaction occurring within the last 10 years: NO PCN reaction that required hospitalization: NO  . Sulfonamide Derivatives Hives, Shortness Of Breath and Other (See Comments)    Flushing & dyspnea Because of a history of documented adverse serious drug reaction;Medi Alert bracelet  is recommended  . Atorvastatin Other (See Comments)    Myalgias & athralgias- takes Crestor, DOES NOT LIKE LIPITOR     Outpatient Medications Prior to Visit  Medication Sig Dispense Refill  . amLODipine (NORVASC) 5 MG tablet TAKE 1 TABLET ONCE DAILY. 60 tablet 1  . Brinzolamide-Brimonidine (SIMBRINZA) 1-0.2 % SUSP Place 1 drop into both eyes 2 times daily.    [name] docusate sodium (COLACE) 100  MG capsule Take 100 mg by mouth 2 (two) times daily.     [name] ELIQUIS 5 MG TABS tablet TAKE 1 TABLET BY MOUTH TWICE DAILY. 120 tablet 1  . empagliflozin (JARDIANCE) 10 MG TABS tablet Take 1 tablet (10 mg total) by mouth daily before breakfast. 30 tablet 11  . escitalopram (LEXAPRO) 10 MG tablet TAKE 1 TABLET BY MOUTH DAILY. 28  tablet 5  . famotidine (PEPCID) 20 MG tablet Take 20 mg by mouth daily as needed for heartburn.    . furosemide (LASIX) 20 MG tablet Take 3 tablets (60 mg total) by mouth 2 (two) times daily. 120 tablet 11  . latanoprost (XALATAN) 0.005 % ophthalmic solution     . losartan (COZAAR) 25 MG tablet Take 1 tablet (25 mg total) by mouth daily. 90 tablet 3  . Melatonin 10 MG CAPS Take 10 mg by mouth at bedtime.    . metoprolol succinate (TOPROL-XL) 25 MG 24 hr tablet TAKE 1/2 TABLET IN THE EVENING WITH OR IMMEDIATELY FOLLOWING A MEAL 30 tablet 1  . montelukast (SINGULAIR) 10 MG tablet TAKE ONE TABLET AT BEDTIME. 28 tablet 5  . Multiple Vitamins-Minerals (CENTRUM SILVER PO) Take 1 tablet by mouth daily.    [name] omeprazole (PRILOSEC) 20 MG capsule Take 20 mg by mouth daily.    Vladimir Faster Glycol-Propyl Glycol 0.4-0.3 % SOLN Place 1 drop into both eyes daily as needed (for dry eyes).     . potassium chloride SA (KLOR-CON) 20 MEQ tablet TAKE 1 TABLET ONCE DAILY. 60 tablet 1  . Probiotic Product (ALIGN) 4 MG CAPS Take by mouth.    . rosuvastatin (CRESTOR) 40 MG tablet TAKE 1 TABLET ONCE DAILY. 60 tablet 1  . senna (SENOKOT) 8.6 MG tablet Take 1 tablet by mouth 2 (two) times daily.    . SYMBICORT 160-4.5 MCG/ACT inhaler USE 2 PUFFS TWICE DAILY. 10.2 g 11   No facility-administered medications prior to visit.    Review of Systems  Constitutional: Negative for chills, fever, malaise/fatigue and weight loss.  HENT: Negative for hearing loss, sore throat and tinnitus.   Eyes: Negative for blurred vision and double vision.  Respiratory: Negative for cough, hemoptysis, sputum production, shortness of breath, wheezing and stridor.   Cardiovascular: Negative for chest pain, palpitations, orthopnea, leg swelling and PND.  Gastrointestinal: Negative for abdominal pain, constipation, diarrhea, heartburn, nausea and vomiting.  Genitourinary: Negative for dysuria, hematuria and urgency.  Musculoskeletal: Negative for  joint pain and myalgias.  Skin: Negative for itching and rash.  Neurological: Negative for dizziness, tingling, weakness and headaches.  Endo/Heme/Allergies: Negative for environmental allergies. Does not bruise/bleed easily.  Psychiatric/Behavioral: Negative for depression. The patient is not nervous/anxious and does not have insomnia.   All other systems reviewed and are negative.    Objective:  Physical Exam Vitals reviewed.  Constitutional:      General: He is not in acute distress.    Appearance: He is well-developed.  HENT:     Head: Normocephalic and atraumatic.  Eyes:     General: No scleral icterus.    Conjunctiva/sclera: Conjunctivae normal.     Pupils: Pupils are equal, round, and reactive to light.  Neck:     Vascular: No JVD.     Trachea: No tracheal deviation.  Cardiovascular:     Rate and Rhythm: Normal rate and regular rhythm.     Heart sounds: Normal heart sounds. No murmur heard.   Pulmonary:     Effort: Pulmonary effort is normal.  No tachypnea, accessory muscle usage or respiratory distress.     Breath sounds: No stridor. No wheezing, rhonchi or rales.  Abdominal:     General: Bowel sounds are normal. There is no distension.     Palpations: Abdomen is soft.     Tenderness: There is no abdominal tenderness.  Musculoskeletal:        General: No tenderness.     Cervical back: Neck supple.  Lymphadenopathy:     Cervical: No cervical adenopathy.  Skin:    General: Skin is warm and dry.     Capillary Refill: Capillary refill takes less than 2 seconds.     Findings: No rash.  Neurological:     Mental Status: He is alert and oriented to person, place, and time.  Psychiatric:        Behavior: Behavior normal.      Vitals:   03/01/21 1147  BP: (!) 150/70  Pulse: 73  Temp: (!) 97.3 F (36.3 C)  SpO2: 92%  Weight: 201 lb (91.2 kg)  Height: 6\' 1"  (1.854 m)   92% on RA BMI Readings from Last 3 Encounters:  03/01/21 26.52 kg/m  02/28/21 26.76  kg/m  02/09/21 26.60 kg/m   Wt Readings from Last 3 Encounters:  03/01/21 201 lb (91.2 kg)  02/28/21 202 lb 12.8 oz (92 kg)  02/09/21 201 lb 9.6 oz (91.4 kg)     CBC    Component Value Date/Time   WBC 4.7 02/09/2021 1136   WBC 5.5 11/10/2020 1435   RBC 4.61 02/09/2021 1136   HGB 14.3 02/09/2021 1136   HGB 12.4 (L) 05/16/2020 1615   HGB 11.8 (L) 03/07/2016 1213   HCT 43.8 02/09/2021 1136   HCT 37.9 05/16/2020 1615   HCT 36.3 (L) 03/07/2016 1213   PLT 248 02/09/2021 1136   PLT 329 05/16/2020 1615   MCV 95.0 02/09/2021 1136   MCV 94 05/16/2020 1615   MCV 85.0 03/07/2016 1213   MCH 31.0 02/09/2021 1136   MCHC 32.6 02/09/2021 1136   RDW 13.9 02/09/2021 1136   RDW 14.2 05/16/2020 1615   RDW 14.7 (H) 03/07/2016 1213   LYMPHSABS 0.6 (L) 02/09/2021 1136   LYMPHSABS 0.9 05/16/2020 1615   LYMPHSABS 0.8 (L) 03/07/2016 1213   MONOABS 0.3 02/09/2021 1136   MONOABS 0.8 03/07/2016 1213   EOSABS 0.1 02/09/2021 1136   EOSABS 0.1 05/16/2020 1615   BASOSABS 0.0 02/09/2021 1136   BASOSABS 0.0 05/16/2020 1615   BASOSABS 0.0 03/07/2016 1213    Chest Imaging: 02/08/2021: Nuclear medicine pet imaging Hypermetabolic bandlike nodules within the right upper lobe along the fissure.  A smaller 13 mm nodule anterior right upper lobe.  Also has a right superior mediastinal node just adjacent to the SVC. All have been followed for some time on CT imaging concern for potential malignancy. The patient's images have been independently reviewed by me.    Pulmonary Functions Testing Results: PFT Results Latest Ref Rng & Units 08/13/2017 10/04/2016  FVC-Pre L 3.10 3.14  FVC-Predicted Pre % 67 67  FVC-Post L - 3.26  FVC-Predicted Post % - 70  Pre FEV1/FVC % % 64 62  Post FEV1/FCV % % - 64  FEV1-Pre L 1.98 1.96  FEV1-Predicted Pre % 59 58  FEV1-Post L - 2.10  DLCO uncorrected ml/min/mmHg 12.40 14.56  DLCO UNC% % 34 40  DLCO corrected ml/min/mmHg - 14.44  DLCO COR %Predicted % - 40  DLVA  Predicted % 56 59  TLC L 7.39  5.77  TLC % Predicted % 97 76  RV % Predicted % 156 92    FeNO:   Pathology:   Echocardiogram:   Heart Catheterization:     Assessment & Plan:     ICD-10-CM   1. Lung nodule  R91.1 CT Super D Chest Wo Contrast  2. Abnormal CT scan, lung  R91.8   3. Abnormal PET scan, lung  R94.2   4. COPD GOLD II   J44.9   5. Personal history of colon cancer  Z85.038   6. History of lung cancer  Z85.118     Discussion:  This is an 80 year old gentleman, history of lung cancer, history of colon cancer, now with abnormal CT scan of the lung abnormal PET scan, predominantly on the right side, bandlike nodule within the right upper lobe hypermetabolic, superior right mediastinal node.  As well as a less hypermetabolic lesion within the anterior portion of the right upper lobe.  Plan: I think the next best steps would include repeat CT scan in 3 months. If there is still persistence of the nodules or change within the areas of concern we could consider navigational bronchoscopy and tissue sampling.  But at this time these have at least been followed for a previous year.  It does appear that the most inferior portion in the right upper lobe has become slightly more dense. If this continues to change I do think we should consider tissue biopsy of that lesion as it does have moderate PET uptake with SUV of 3 on recent pet imaging. Patient is agreeable to this plan. We discussed various alternatives today in the office to include early tissue sampling. Patient is agreeable for a 48-month follow-up and a decision made then.  RTC in clinic after CT scan complete.   Current Outpatient Medications:  .  amLODipine (NORVASC) 5 MG tablet, TAKE 1 TABLET ONCE DAILY., Disp: 60 tablet, Rfl: 1 .  Brinzolamide-Brimonidine (SIMBRINZA) 1-0.2 % SUSP, Place 1 drop into both eyes 2 times daily., Disp: , Rfl:  .  docusate sodium (COLACE) 100 MG capsule, Take 100 mg by mouth 2 (two) times  daily. , Disp: , Rfl:  .  ELIQUIS 5 MG TABS tablet, TAKE 1 TABLET BY MOUTH TWICE DAILY., Disp: 120 tablet, Rfl: 1 .  empagliflozin (JARDIANCE) 10 MG TABS tablet, Take 1 tablet (10 mg total) by mouth daily before breakfast., Disp: 30 tablet, Rfl: 11 .  escitalopram (LEXAPRO) 10 MG tablet, TAKE 1 TABLET BY MOUTH DAILY., Disp: 28 tablet, Rfl: 5 .  famotidine (PEPCID) 20 MG tablet, Take 20 mg by mouth daily as needed for heartburn., Disp: , Rfl:  .  furosemide (LASIX) 20 MG tablet, Take 3 tablets (60 mg total) by mouth 2 (two) times daily., Disp: 120 tablet, Rfl: 11 .  latanoprost (XALATAN) 0.005 % ophthalmic solution, , Disp: , Rfl:  .  losartan (COZAAR) 25 MG tablet, Take 1 tablet (25 mg total) by mouth daily., Disp: 90 tablet, Rfl: 3 .  Melatonin 10 MG CAPS, Take 10 mg by mouth at bedtime., Disp: , Rfl:  .  metoprolol succinate (TOPROL-XL) 25 MG 24 hr tablet, TAKE 1/2 TABLET IN THE EVENING WITH OR IMMEDIATELY FOLLOWING A MEAL, Disp: 30 tablet, Rfl: 1 .  montelukast (SINGULAIR) 10 MG tablet, TAKE ONE TABLET AT BEDTIME., Disp: 28 tablet, Rfl: 5 .  Multiple Vitamins-Minerals (CENTRUM SILVER PO), Take 1 tablet by mouth daily., Disp: , Rfl:  .  omeprazole (PRILOSEC) 20 MG capsule, Take 20 mg  by mouth daily., Disp: , Rfl:  .  Polyethyl Glycol-Propyl Glycol 0.4-0.3 % SOLN, Place 1 drop into both eyes daily as needed (for dry eyes). , Disp: , Rfl:  .  potassium chloride SA (KLOR-CON) 20 MEQ tablet, TAKE 1 TABLET ONCE DAILY., Disp: 60 tablet, Rfl: 1 .  Probiotic Product (ALIGN) 4 MG CAPS, Take by mouth., Disp: , Rfl:  .  rosuvastatin (CRESTOR) 40 MG tablet, TAKE 1 TABLET ONCE DAILY., Disp: 60 tablet, Rfl: 1 .  senna (SENOKOT) 8.6 MG tablet, Take 1 tablet by mouth 2 (two) times daily., Disp: , Rfl:  .  SYMBICORT 160-4.5 MCG/ACT inhaler, USE 2 PUFFS TWICE DAILY., Disp: 10.2 g, Rfl: 11  I spent 62 minutes dedicated to the care of this patient on the date of this encounter to include pre-visit review of  records, face-to-face time with the patient discussing conditions above, post visit ordering of testing, clinical documentation with the electronic health record, making appropriate referrals as documented, and communicating necessary findings to members of the patients care team.   Garner Nash, DO [facility] 03/01/2021 5:52 PM

## 2021-03-01 NOTE — Patient Instructions (Signed)
Thank you for visiting Dr. Valeta Harms at [facility]. Today we recommend the following:  Orders Placed This Encounter  Procedures  . CT Super D Chest Wo Contrast   Follow up with me afterwards.   Return in about 3 months (around 06/01/2021) for w/ Dr. Valeta Harms .    Please do your part to reduce the spread of COVID-19.

## 2021-03-07 ENCOUNTER — Other Ambulatory Visit (HOSPITAL_COMMUNITY): Payer: Self-pay | Admitting: Cardiology

## 2021-03-07 DIAGNOSIS — R0602 Shortness of breath: Secondary | ICD-10-CM

## 2021-03-07 DIAGNOSIS — I509 Heart failure, unspecified: Secondary | ICD-10-CM

## 2021-03-10 ENCOUNTER — Ambulatory Visit (HOSPITAL_COMMUNITY)
Admission: RE | Admit: 2021-03-10 | Discharge: 2021-03-10 | Disposition: A | Discharge status: Home / Self Care | Payer: PPO | Source: Ambulatory Visit | Attending: Internal Medicine | Admitting: Internal Medicine

## 2021-03-10 ENCOUNTER — Other Ambulatory Visit: Payer: Self-pay

## 2021-03-10 DIAGNOSIS — I5032 Chronic diastolic (congestive) heart failure: Secondary | ICD-10-CM

## 2021-03-10 LAB — BASIC METABOLIC PANEL
Anion gap: 8 (ref 5–15)
BUN: 23 mg/dL (ref 8–23)
CO2: 28 mmol/L (ref 22–32)
Calcium: 8.9 mg/dL (ref 8.9–10.3)
Chloride: 103 mmol/L (ref 98–111)
Creatinine, Ser: 1.61 mg/dL — ABNORMAL HIGH (ref 0.61–1.24)
GFR, Estimated: 43 mL/min — ABNORMAL LOW (ref >60)
Glucose, Bld: 111 mg/dL — ABNORMAL HIGH (ref 70–99)
Potassium: 3.9 mmol/L (ref 3.5–5.1)
Sodium: 139 mmol/L (ref 135–145)

## 2021-03-16 DIAGNOSIS — G4733 Obstructive sleep apnea (adult) (pediatric): Secondary | ICD-10-CM | POA: Diagnosis not present

## 2021-03-20 ENCOUNTER — Other Ambulatory Visit: Payer: PPO

## 2021-03-20 DIAGNOSIS — H401133 Primary open-angle glaucoma, bilateral, severe stage: Secondary | ICD-10-CM | POA: Diagnosis not present

## 2021-03-21 ENCOUNTER — Ambulatory Visit
Admission: RE | Admit: 2021-03-21 | Discharge: 2021-03-21 | Disposition: A | Discharge status: Home / Self Care | Payer: PPO | Source: Ambulatory Visit | Attending: Pulmonary Disease | Admitting: Pulmonary Disease

## 2021-03-21 ENCOUNTER — Other Ambulatory Visit: Payer: Self-pay

## 2021-03-21 DIAGNOSIS — R911 Solitary pulmonary nodule: Secondary | ICD-10-CM

## 2021-03-21 DIAGNOSIS — R918 Other nonspecific abnormal finding of lung field: Secondary | ICD-10-CM | POA: Diagnosis not present

## 2021-03-22 NOTE — Progress Notes (Signed)
Carelink Summary Report / Loop Recorder 

## 2021-03-23 ENCOUNTER — Ambulatory Visit (INDEPENDENT_AMBULATORY_CARE_PROVIDER_SITE_OTHER): Payer: PPO

## 2021-03-23 DIAGNOSIS — R55 Syncope and collapse: Secondary | ICD-10-CM

## 2021-03-23 LAB — CUP PACEART REMOTE DEVICE CHECK
Date Time Interrogation Session: 20220623010905
Implantable Pulse Generator Implant Date: 20200709

## 2021-03-29 ENCOUNTER — Other Ambulatory Visit: Payer: Self-pay | Admitting: Physician Assistant

## 2021-03-29 DIAGNOSIS — I6523 Occlusion and stenosis of bilateral carotid arteries: Secondary | ICD-10-CM

## 2021-03-29 DIAGNOSIS — I714 Abdominal aortic aneurysm, without rupture, unspecified: Secondary | ICD-10-CM

## 2021-03-29 DIAGNOSIS — I739 Peripheral vascular disease, unspecified: Secondary | ICD-10-CM

## 2021-04-05 ENCOUNTER — Other Ambulatory Visit: Payer: Self-pay | Admitting: Internal Medicine

## 2021-04-07 ENCOUNTER — Other Ambulatory Visit (HOSPITAL_COMMUNITY): Payer: Self-pay | Admitting: *Deleted

## 2021-04-07 MED ORDER — FUROSEMIDE 20 MG PO TABS: 60.0000 mg | ORAL_TABLET | Freq: Two times a day (BID) | ORAL | 11 refills | Status: DC

## 2021-04-11 ENCOUNTER — Inpatient Hospital Stay: Payer: PPO | Admitting: Oncology

## 2021-04-12 NOTE — Progress Notes (Signed)
Carelink Summary Report / Loop Recorder 

## 2021-04-18 ENCOUNTER — Inpatient Hospital Stay: Payer: PPO | Attending: Oncology | Admitting: Oncology

## 2021-04-18 ENCOUNTER — Other Ambulatory Visit: Payer: Self-pay

## 2021-04-18 VITALS — BP 137/60 | HR 64 | Temp 98.2°F | Resp 20 | Ht 73.0 in | Wt 198.6 lb

## 2021-04-18 DIAGNOSIS — Z7901 Long term (current) use of anticoagulants: Secondary | ICD-10-CM | POA: Diagnosis not present

## 2021-04-18 DIAGNOSIS — R918 Other nonspecific abnormal finding of lung field: Secondary | ICD-10-CM | POA: Diagnosis not present

## 2021-04-18 DIAGNOSIS — I4891 Unspecified atrial fibrillation: Secondary | ICD-10-CM | POA: Insufficient documentation

## 2021-04-18 DIAGNOSIS — C3431 Malignant neoplasm of lower lobe, right bronchus or lung: Secondary | ICD-10-CM | POA: Diagnosis not present

## 2021-04-18 DIAGNOSIS — C187 Malignant neoplasm of sigmoid colon: Secondary | ICD-10-CM | POA: Diagnosis not present

## 2021-04-18 DIAGNOSIS — J449 Chronic obstructive pulmonary disease, unspecified: Secondary | ICD-10-CM | POA: Insufficient documentation

## 2021-04-18 NOTE — Progress Notes (Signed)
[place] OFFICE PROGRESS NOTE   Diagnosis: Colon cancer, non-small cell lung cancer  INTERVAL HISTORY:   [name] returns as scheduled.  He feels well.  He is exercising.  No bleeding.  No dyspnea. He saw Dr. Valeta Harms for evaluation of lung nodules.  A CT super D chest on 03/21/2021.  Consolidation and nodularity at the posterior inferior right upper lobe is slightly diminished.  An adjacent anterior nodule is also slightly diminished.  Objective:  Vital signs in last 24 hours:  Blood pressure 137/60, pulse 64, temperature 98.2 F (36.8 C), temperature source Oral, resp. rate 20, height $RemoveBe'6\' 1"'ghaMWtJiE$  (1.854 m), weight 198 lb 9.6 oz (90.1 kg), SpO2 92 %.    Lymphatics: No cervical, supraclavicular, axillary, or inguinal nodes Resp: Lungs clear bilaterally with decreased breath sounds at the right lower posterior chest, no respiratory distress Cardio: Regular rate and rhythm GI: No hepatosplenomegaly, no mass Vascular: No leg edema   Lab Results:  Lab Results  Component Value Date   WBC 4.7 02/09/2021   HGB 14.3 02/09/2021   HCT 43.8 02/09/2021   MCV 95.0 02/09/2021   PLT 248 02/09/2021   NEUTROABS 3.8 02/09/2021    CMP  Lab Results  Component Value Date   NA 139 03/10/2021   K 3.9 03/10/2021   CL 103 03/10/2021   CO2 28 03/10/2021   GLUCOSE 111 (H) 03/10/2021   BUN 23 03/10/2021   CREATININE 1.61 (H) 03/10/2021   CALCIUM 8.9 03/10/2021   PROT 7.3 12/09/2019   ALBUMIN 3.6 12/09/2019   AST 23 12/09/2019   ALT 22 12/09/2019   ALKPHOS 82 12/09/2019   BILITOT 0.8 12/09/2019   GFRNONAA 43 (L) 03/10/2021   GFRAA 74 05/16/2020    Lab Results  Component Value Date   CEA1 1.43 02/09/2021   CEA 2.29 02/09/2021     Medications: I have reviewed the patient's current medications.   Assessment/Plan:  1.  Adenocarcinoma of sigmoid colon-stage II (T3N0)  Colonoscopy 06/25/2018- multiple polyps in the ascending and descending colon, partially obstructing  mass in the sigmoid colon-biopsy confirmed adenocarcinoma CTs 06/25/2018-enlarging right paratracheal lymph node compared to November 2018, no other evidence of metastatic disease Low anterior resection 12/12/2018, stage II (T3N0) adenocarcinoma the sigmoid colon, no loss of mismatch repair protein expression, MSI-stable Colonoscopy 06/23/2020-polyps removed from the transverse colon, ascending colon, and cecum-tubular adenomas  CT chest 01/14/2020-stable mildly enlarged high right paratracheal node and right hilar node CT abdomen/pelvis 01/29/2020-no evidence of recurrent disease, stable right lower lobe nodule CT chest 01/24/2021-mild growth of 2 irregular basilar right upper lobe nodules, stable high right mediastinal node PET scan 02/08/2021- band of nodules in the right upper lobe including a 13 mm nodule with an SUV of 3.2 and a 13 mm nodule with an SUV of 1.4.,  12 mm nodule in the upper right mediastinum with an SUV of 3.5     2.  History of iron deficiency anemia Secondary to bleeding from tumor, exacerbated by anticoagulation with eliquis  3. 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 Elenor Quinones at [facility]. Last Ct chest 05/01/18 stable, without concerning evidence of recurrence  -CT 06/25/2018- nonspecific small bilateral nodules, enlarging superior right paratracheal node -EUS biopsy  of the right paratracheal nodes (several rounded nodes measuring 4 cm in total) on 07/10/2018- negative for malignancy -CT chest 03/03/2019- stable postoperative appearance of the chest, unchanged mildly prominent right paratracheal lymph node, no evidence of metastatic disease -CT chest 01/14/2020-stable surgical changes in the  right chest, stable mild mediastinal and right hilar adenopathy -CT chest 01/24/2021- mild interval growth of irregular nodular opacities in the right upper lobe along the minor fissure -PET [id number]/[id number] of hypermetabolic nodules in the inferior aspect of the right upper lobe-indeterminate, nodularity increased over time.  Hypermetabolic nodule in the right upper mediastinum, similar small right effusion -CT super D chest 03/21/2021-consolidation and nodularity in the posterior inferior right upper lobe slightly diminished, unchanged loculated small right effusion, fine clustered centrilobular nodularity concentrated in the left upper lobe-unchanged.  No enlarged mediastinal, hilar, or axillary nodes   4. COPD 5. Atrial fibrillation 6. Thyrotoxicosis-potentially amiodarone related 7.  Multiple gastric polyps on endoscopy 06/23/2020-1 oozing polyp-hyperplastic polyp, biopsy from the gastric antrum revealed intestinal metaplasia     Disposition: [name] is in clinical remission from lung cancer and colon cancer.  He will return for a CEA in 2 months and an office visit in 6 months.  The CT chest 03/21/2021 revealed a slight decrease in the right upper lobe nodularity.  He is scheduled to see Dr. Valeta Harms in August for follow-up of lung nodules.  I will defer repeat imaging to Dr. Valeta Harms.  Betsy Coder, MD  04/18/2021  12:04 PM

## 2021-04-18 NOTE — Progress Notes (Signed)
Advanced Heart Failure Clinic Note  Patient ID: Troy Glassburn [name], male   DOB: 01/14/1941, 80 y.o.   MRN: [id number] PCP: Dr. Quay Burow Cardiology: [name]  80 y.o. with history of HTN, COPD, active smoking/COPD, carotid stenosis s/p right CEA, paroxysmal atrial fibrillation, and PAD.   He does not have known obstructive CAD but is at high risk for CAD based on his comorbidities.  Lexiscan Cardiolite in 3/14 showed no ischemia or infarction and echo in 3/14 showed normal EF.  He had a left fem-pop bypass at the [facility] in the '90s.  He is followed at [facility] for PAD. He has chronic right calf/thigh/buttocks claudication that is unchanged over the last few years and follows regularly at [facility].  He has had right lower lobectomy for lung cancer.  He continues to stay off cigarettes.    At a prior appointment, he was in atrial fibrillation but did not realize it. Dr Aundra Dubin  started him on Eliquis and diltiazem CD, and he spontaneously converted to NSR.  In 5/15, he was at [facility] and felt "strange" one day: fatigued, weak, short of breath.  He went to the ER and was in atrial fibrillation with HR in 80s-90s.  He spontaneously converted to NSR in the ER.  He felt back to normal after converting to NSR.  Started on Multaq 400 mg bid.  With CHF, he was eventually transitioned over to amiodarone.   He had patch angioplasty revision of left fem-pop bypass in 11/16.  Now with minimal claudication.  He follows with [facility].     He had a Cardiolite and echo in 7/17 that were unremarkable.  Repeat Cardiolite in 12/17 showed no ischemia, echo was uninterpretable.  Cardiac MRI was therefore done in 1/18, showing EF 66% with normal-appearing RV, no late gadolinium enhancement.   Given increased exertional dyspnea and a defect on Cardiolite, he had LHC in 11/18.  This showed no obstructive CAD. He had a chest CT done given concern for possible amiodarone lung toxicity with increased dyspnea. This showed emphysema,  no ILD.    In 5/19, he developed a probable viral syndrome with dehydration and had a presyncopal episode.  He was orthostatic in the hospital.  He was noted to be in atrial fibrillation this admission which remained persistent.  He had a TEE-guided DCCV back to NSR.   In 9/19, he was admitted with GI bleeding and found to have a sigmoid mass on colonoscopy, path showed sigmoid adenocarcinoma.  He was noted to have right paratracheal lymphadenopathy but EUS with biopsy in 10/19 did not show malignancy.  He had surgical resection of colon cancer.   Amiodarone was stopped with elevated ESR and increased dyspnea.  He was also found to have hyperthyroidism. He was treated with methimazole by endocrinology.    He had atrial fibrillation ablation in 7/20.   Echo in 4/21 showed EF 60-65%, normal RV.    In 5/21, due to high atrial fibrillation burden, he had redo atrial fibrillation ablation.   Cardiolite in 6/21 showed EF 70%, no ischemia/infarction.   He returned 5/22 for followup of CHF.  He was in NSR.  Weight is up 1 lb.  He has shortness of breath that waxes and wanes, some days worse than others.  Generally able to walk in his driveway for exercise and dose ok for short distances on flat ground. His lasix was increased to 60 mg bid. Patient has been noted to have an enlarging RUL nodule  that was mildly hypermetabolic on PET, he will be seeing Dr. Valeta Harms for bronchoscopy and biopsy.   Today he returns for HF follow up. Overall feeling fine. Denies increasing SOB, CP, dizziness, edema, or PND/Orthopnea. Appetite ok. No fever or chills. Weight at home 197 pounds. Taking all medications. Exercising with personal trainer 3x/week (stretching, walking, weights), mild SOB with physical exertion.  ECG (personally reviewed): SB w/ AVB, nonspecific flattening of lateral T waves  ReDs Clip: 36%  Labs (3/13): LDL 75, HDL 35, K 4.1, creatinine 1.1 Labs (3/14): LDL 91, HDL 27 Labs (10/14): K 4.3, creatinine  0.98 Labs (4/15): K 4.9, creatinine 1.1, LDL 76, HDL 30 Labs (5/15): K 4.3, creatinine 1.7, BNP 251 Labs (6/15): K 4.4, creatinine 1.3 Labs (10/15): TSH normal Labs (5/16): K 4.2, creatinine 1.12, HCT 39.5, LFTs normal, LDL 84, HDL 43, TSH normal Labs (11/16): creatinine 0.94 Labs (4/17): LDL 39, HDL 39 Labs (6/17): K 4.5, creatinine 1.2 Labs (9/17): K 4, creatinine 1.25, HCT 38.7 Labs (12/17): K 4.5, creatinine 1.49 => 1.62, BNP 43 Labs (2/18): K 3.8, creatinine 1.33, LFTs normal, TSH normal Labs (6/18): K 4.3, creatinine 1.49, LFTs normal, TSH normal, hgb 12.3 Labs (11/18): ESR 60, TSH normal, LFTs normal, creatinine 1.34 Labs (1/19): LDL 50 Labs (5/19): K 4.6, creatinine 1.55, LFTs normal, hgb 12.7, LFTs normal Labs (10/19): K 4 => 4.1, creatinine 1.39 => 1.43, BNP 279, hgb 10.7, TSH low, free T4 and free T3 low, ESR 108 Labs (3/21): K 4.5, creatinine 1.19, TSH normal Labs (4/21): K 4.8, creatinine 1.28, BNP 524, LDL 59 Labs (7/21): hgb 11.5, creatinine 1.4 Labs (8/21): K 4.3, creatinine 1.09 Labs (9/21): hgb 12.7 Labs (2/22): K 5, creatinine 1.54 Labs (5/22): K 4.2, creatinine 1.38, hgb 14.3, LDL 31 Labs (6/22): K 3.9, creatinine 1.61  PMH: 1. HTN: Fatigue and cough with ramipril use.  2. COPD: Quit smoking 2014. PFTs (1/18) with moderately severe COPD.  - CT chest (11/18): Emphysema noted, no ILD.  3. AAA: CT 1/13 with 3.0 cm AAA.  Abdominal US (1/14) with 3.4 cm AAA. Abdominal US (4/15) with 3.25 x 3.27 AAA. Abdominal US (3/16) with 3.7 cm AAA.  Followed at [facility].  - Abd [name] (1/19): 3.3 cm AAA. 4. Squamous cell lung cancer diagnosed 2/12.  Had right lower lobectomy in 4/12.  5. Hyperlipidemia 6. PAD: Left fem-pop bypass 1994.  ABIs (2/12) 0.62 on right, 0.95 on left. ABIs (1/14): 0.65 on right, 1.04 on left. ABIs (4/15) 0.66 right, 0.87 left.  ABIs (3/16) 0.6 right, 0.88 left.  Patch angioplasty left fem-pop bypass in 11/16.   - Left fem-pop bypass patent on doppler evaluation  in 12/17.  - ABIs (2/21): normal on left (1.02), moderately decreased on right (0.54).  7. CAD: Stress myoview 2004 was normal. Lexiscan Cardiolite (3/14) with EF 66%, no ischemia or infarction. Lexiscan Cardiolite (7/17) with EF 61%, no ischemia/infarction.  - Cardiolite (12/17) with EF 62%, inferior/inferolateral fixed defect, most likely diaphragmatic attenuation, no ischemia.  - Cardiolite (9/18) with EF > 65%, fixed inferior defect (attenuation versus infarction), no ischemia.  - LHC (11/18): No obstructive CAD.  - Cardiolite (6/21): EF 70%, no ischemia/infarction 8. Chronic diastolic CHF: Echo (4/50): technically difficult with EF 55-60%, upper normal RV size.  Echo (5/16) with EF 60-65%.  Echo (7/17) with EF 55-60%, normal RV size and systolic function.  - Echo 12/17 with very poor windows, unable to comment on LV or RV function.  - Cardiac MRI (1/18) with  EF 66%, normal RV size and systolic function.  - TEE (5/19): EF 55-60%, normal RV size and systolic function.  - Echo (4/21): EF 60-65%, normal RV 9. Carotid stenosis: TIA 10/14.  Carotid dopplers with > 37% RICA, 85-88% LICA.  Patient had right CEA in 10/14. Carotids (4/15) patent right CEA, LICA 50-27% stenosis.  Carotids (74/12) < 87% LICA, patent right CEA.  - Carotid dopplers (12/17): right CEA ok, < 86% LICA stenosis.  - Carotid dopplers (1/19): Right CEA ok, 7-67% LICA stenosis.  - Carotid dopplers (2/21): Right CEA ok, LICA 20-94% stenosis.  10. Atrial fibrillation: Paroxysmal. First noted after lobectomy in 4/12 (brief), recurrence in 4/15 then in 5/15.  - TEE-guided DCCV for persistent atrial fibrillation in 5/19.  - Atrial fibrillation ablation 7/20.  - Re-do atrial fibrillation ablation 5/21.  11. Spinal stenosis 12. OSA: Using CPAP.  13. Glaucoma 14. Has ILR 15. Colon cancer: Sigmoid adenocarcinoma, s/p surgical resection.  16. 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   SH: Married, lives in [place].   Former [place].  Quit smoking in 2014.  Rare ETOH now.   FH: No premature CAD  ROS: All systems reviewed and negative except as per HPI.   Current Outpatient Medications  Medication Sig Dispense Refill   amLODipine (NORVASC) 5 MG tablet TAKE ONE TABLET ONCE DAILY, 60 tablet 1   Brinzolamide-Brimonidine (SIMBRINZA) 1-0.2 % SUSP Place 1 drop into both eyes 2 times daily.     docusate sodium (COLACE) 100 MG capsule Take 100 mg by mouth 2 (two) times daily.      ELIQUIS 5 MG TABS tablet TAKE ONE TABLET BY MOUTH TWICE DAILY. 120 tablet 1   empagliflozin (JARDIANCE) 10 MG TABS tablet Take 1 tablet (10 mg total) by mouth daily before breakfast. 30 tablet 11   escitalopram (LEXAPRO) 10 MG tablet TAKE ONE TABLET ONCE DAILY 28 tablet 5   famotidine (PEPCID) 20 MG tablet Take 20 mg by mouth daily as needed for heartburn.     furosemide (LASIX) 20 MG tablet Take 3 tablets (60 mg total) by mouth 2 (two) times daily. 180 tablet 11   latanoprost (XALATAN) 0.005 % ophthalmic solution      losartan (COZAAR) 25 MG tablet Take 1 tablet (25 mg total) by mouth daily. 90 tablet 3   Melatonin 10 MG CAPS Take 10 mg by mouth at bedtime.     metoprolol succinate (TOPROL-XL) 25 MG 24 hr tablet TAKE 1/2 TABLET IN THE EVENING WITH OR IMMEDIATELY FOLLOWING A MEAL 30 tablet 1   montelukast (SINGULAIR) 10 MG tablet TAKE ONE TABLET AT BEDTIME. 28 tablet 5   Multiple Vitamins-Minerals (CENTRUM SILVER PO) Take 1 tablet by mouth daily.     omeprazole (PRILOSEC) 20 MG capsule Take 20 mg by mouth daily.     Polyethyl Glycol-Propyl Glycol 0.4-0.3 % SOLN Place 1 drop into both eyes daily as needed (for dry eyes).      potassium chloride SA (KLOR-CON) 20 MEQ tablet TAKE ONE TABLET ONCE DAILY. 60 tablet 1   Probiotic Product (ALIGN) 4 MG CAPS Take by mouth.     rosuvastatin (CRESTOR) 40 MG tablet TAKE ONE TABLET ONCE DAILY 60 tablet 1   senna (SENOKOT) 8.6 MG tablet Take 1 tablet by mouth 2 (two) times daily.      SYMBICORT 160-4.5 MCG/ACT inhaler USE 2 PUFFS TWICE DAILY. 10.2 g 11   No current facility-administered medications for this encounter.   Wt Readings from Last  3 Encounters:  04/19/21 91.7 kg (202 lb 3.2 oz)  04/18/21 90.1 kg (198 lb 9.6 oz)  03/01/21 91.2 kg (201 lb)   BP 128/60   Pulse (!) 56   Wt 91.7 kg (202 lb 3.2 oz)   SpO2 93%   BMI 26.68 kg/m    General:  NAD. No resp difficulty HEENT: Normal Neck: Supple. JVP 6-7. Carotids 2+ bilat; no bruits. No lymphadenopathy or thryomegaly appreciated. Cor: PMI nondisplaced. Regular rate & rhythm. No rubs, gallops or murmurs. Lungs: Clear, absent breath sounds RLL Abdomen: Soft, nontender, nondistended. No hepatosplenomegaly. No bruits or masses. Good bowel sounds. Extremities: No cyanosis, clubbing, rash, trace LE edema Neuro: Alert & oriented x 3, cranial nerves grossly intact. Moves all 4 extremities w/o difficulty. Affect pleasant.  Assessment/Plan: 1. Chronic diastolic CHF: Echo in 7/11 with EF 60-65%, normal RV.  NYHA class II- early III symptoms, he does not look volume overloaded on exam. Reds 36%. OK to take an extra lasix today and tomorrow (drank wine and ate salty meal last night).   - Continue Lasix 60 mg bid. BMET today. - Continue Jardiance 10 mg daily.  - Discussed limiting salt and fluids. 2. Hyperlipidemia: Continue Crestor, good lipids in 3/22.  3. Carotid stenosis: s/p R CEA.  Dopplers followed at [facility].  4. PAD: s/p patch angioplasty to left fem-pop bypass in 11/16.  Followed at [facility].  Moderately decreased ABI on right in 2/21, but no pain or pedal ulcerations.  Stable mild claudication.  - He is due for followup at [facility], needs appt.  5. AAA: Stable on last [name], followed at [facility].    6. COPD: No longer smoking. PFTs in 1/18 showed moderately severe obstructive defect. CT chest in 11/18 showed emphysema.   7. Atrial fibrillation: S/p atrial fibrillation ablation in 7/20 and redo in 5/21.  He has been primarily in NSR  since redo ablation.  He is in SR today. - Continue Toprol XL12.5 mg daily.  - Continue Eliquis 5 mg bid. No bleeding issues. - Continue to limit ETOH.  - Continue to use CPAP.  8. CKD: Stage 3.  BMET today. 9. CAD: LHC in 11/18 with nonobstructive disease only.  Cardiolite in 6/21 with no ischemia/infarction. No chest pain.  10. 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  He is no longer on methimazole, follows with endocrinology.     11. Colon cancer: Sigmoid adenocarcinoma, s/p surgical resection. In remission.  12. HTN: Stable.   13. Pleural effusion: Chronic right pleural effusion, s/p right lower lobectomy.     14. Squamous cell lung cancer: S/p right lower lobectomy.  CT chest recently with enlarging RUL nodule that was mildly hypermetabolic by PET, seeing Dr. Valeta Harms for bronch/biopsy.   Followup 3 months with [name].  [place]ell FNP 04/19/2021

## 2021-04-19 ENCOUNTER — Ambulatory Visit (HOSPITAL_COMMUNITY)
Admission: RE | Admit: 2021-04-19 | Discharge: 2021-04-19 | Disposition: A | Discharge status: Home / Self Care | Payer: PPO | Source: Ambulatory Visit | Attending: Family Medicine | Admitting: Family Medicine

## 2021-04-19 ENCOUNTER — Encounter (HOSPITAL_COMMUNITY): Payer: Self-pay

## 2021-04-19 VITALS — BP 128/60 | HR 56 | Wt 202.2 lb

## 2021-04-19 DIAGNOSIS — I25119 Atherosclerotic heart disease of native coronary artery with unspecified angina pectoris: Secondary | ICD-10-CM | POA: Diagnosis not present

## 2021-04-19 DIAGNOSIS — Z87891 Personal history of nicotine dependence: Secondary | ICD-10-CM | POA: Diagnosis not present

## 2021-04-19 DIAGNOSIS — I739 Peripheral vascular disease, unspecified: Secondary | ICD-10-CM | POA: Diagnosis not present

## 2021-04-19 DIAGNOSIS — E782 Mixed hyperlipidemia: Secondary | ICD-10-CM | POA: Diagnosis not present

## 2021-04-19 DIAGNOSIS — Z7901 Long term (current) use of anticoagulants: Secondary | ICD-10-CM | POA: Diagnosis not present

## 2021-04-19 DIAGNOSIS — Z902 Acquired absence of lung [part of]: Secondary | ICD-10-CM | POA: Diagnosis not present

## 2021-04-19 DIAGNOSIS — J90 Pleural effusion, not elsewhere classified: Secondary | ICD-10-CM | POA: Diagnosis not present

## 2021-04-19 DIAGNOSIS — I480 Paroxysmal atrial fibrillation: Secondary | ICD-10-CM | POA: Insufficient documentation

## 2021-04-19 DIAGNOSIS — Z85118 Personal history of other malignant neoplasm of bronchus and lung: Secondary | ICD-10-CM | POA: Insufficient documentation

## 2021-04-19 DIAGNOSIS — Z85038 Personal history of other malignant neoplasm of large intestine: Secondary | ICD-10-CM | POA: Diagnosis not present

## 2021-04-19 DIAGNOSIS — C187 Malignant neoplasm of sigmoid colon: Secondary | ICD-10-CM | POA: Diagnosis not present

## 2021-04-19 DIAGNOSIS — I6523 Occlusion and stenosis of bilateral carotid arteries: Secondary | ICD-10-CM

## 2021-04-19 DIAGNOSIS — Z9862 Peripheral vascular angioplasty status: Secondary | ICD-10-CM | POA: Insufficient documentation

## 2021-04-19 DIAGNOSIS — I10 Essential (primary) hypertension: Secondary | ICD-10-CM | POA: Diagnosis not present

## 2021-04-19 DIAGNOSIS — I5032 Chronic diastolic (congestive) heart failure: Secondary | ICD-10-CM | POA: Diagnosis not present

## 2021-04-19 DIAGNOSIS — J439 Emphysema, unspecified: Secondary | ICD-10-CM | POA: Diagnosis not present

## 2021-04-19 DIAGNOSIS — I130 Hypertensive heart and chronic kidney disease with heart failure and stage 1 through stage 4 chronic kidney disease, or unspecified chronic kidney disease: Secondary | ICD-10-CM | POA: Diagnosis not present

## 2021-04-19 DIAGNOSIS — I6521 Occlusion and stenosis of right carotid artery: Secondary | ICD-10-CM | POA: Diagnosis not present

## 2021-04-19 DIAGNOSIS — I714 Abdominal aortic aneurysm, without rupture, unspecified: Secondary | ICD-10-CM

## 2021-04-19 DIAGNOSIS — Z85030 Personal history of malignant carcinoid tumor of large intestine: Secondary | ICD-10-CM | POA: Insufficient documentation

## 2021-04-19 DIAGNOSIS — E0590 Thyrotoxicosis, unspecified without thyrotoxic crisis or storm: Secondary | ICD-10-CM

## 2021-04-19 DIAGNOSIS — Z8709 Personal history of other diseases of the respiratory system: Secondary | ICD-10-CM

## 2021-04-19 DIAGNOSIS — Z79899 Other long term (current) drug therapy: Secondary | ICD-10-CM | POA: Insufficient documentation

## 2021-04-19 DIAGNOSIS — I4891 Unspecified atrial fibrillation: Secondary | ICD-10-CM | POA: Diagnosis not present

## 2021-04-19 DIAGNOSIS — Z7984 Long term (current) use of oral hypoglycemic drugs: Secondary | ICD-10-CM | POA: Insufficient documentation

## 2021-04-19 DIAGNOSIS — E785 Hyperlipidemia, unspecified: Secondary | ICD-10-CM | POA: Diagnosis not present

## 2021-04-19 DIAGNOSIS — Z7951 Long term (current) use of inhaled steroids: Secondary | ICD-10-CM | POA: Diagnosis not present

## 2021-04-19 DIAGNOSIS — C3490 Malignant neoplasm of unspecified part of unspecified bronchus or lung: Secondary | ICD-10-CM

## 2021-04-19 DIAGNOSIS — N183 Chronic kidney disease, stage 3 unspecified: Secondary | ICD-10-CM | POA: Diagnosis not present

## 2021-04-19 LAB — BASIC METABOLIC PANEL
Anion gap: 10 (ref 5–15)
BUN: 19 mg/dL (ref 8–23)
CO2: 26 mmol/L (ref 22–32)
Calcium: 8.9 mg/dL (ref 8.9–10.3)
Chloride: 100 mmol/L (ref 98–111)
Creatinine, Ser: 1.72 mg/dL — ABNORMAL HIGH (ref 0.61–1.24)
GFR, Estimated: 40 mL/min — ABNORMAL LOW (ref >60)
Glucose, Bld: 103 mg/dL — ABNORMAL HIGH (ref 70–99)
Potassium: 4 mmol/L (ref 3.5–5.1)
Sodium: 136 mmol/L (ref 135–145)

## 2021-04-19 NOTE — Progress Notes (Signed)
ReDS Vest / Clip - 04/19/21 1500       ReDS Vest / Clip   Station Marker C    Ruler Value 29    ReDS Value Range Moderate volume overload    ReDS Actual Value 36

## 2021-04-19 NOTE — Patient Instructions (Signed)
It was great to see you today! No medication changes are needed at this time.  Labs today We will only contact you if something comes back abnormal or we need to make some changes. Otherwise no news is good news!  Your physician recommends that you schedule a follow-up appointment in: 3-4 months with Dr Aundra Dubin  Do the following things EVERYDAY: Weigh yourself in the morning before breakfast. Write it down and keep it in a log. Take your medicines as prescribed Eat low salt foods--Limit salt (sodium) to 2000 mg per day.  Stay as active as you can everyday Limit all fluids for the day to less than 2 liters  milAt the [facility], you and your health needs are our priority. As part of our continuing mission to provide you with exceptional heart care, we have created designated Provider Care Teams. These Care Teams include your primary Cardiologist (physician) and Advanced Practice Providers (APPs- Physician Assistants and Nurse Practitioners) who all work together to provide you with the care you need, when you need it.   You may see any of the following providers on your designated Care Team at your next follow up: Dr Glori Bickers Dr [name] Dr Patrice Paradise, NP Lyda Jester, [place] Ginnie Smart Audry Riles, PharmD   Please be sure to bring in all your medications bottles to every appointment.

## 2021-04-20 ENCOUNTER — Other Ambulatory Visit: Payer: Self-pay

## 2021-04-20 ENCOUNTER — Ambulatory Visit (INDEPENDENT_AMBULATORY_CARE_PROVIDER_SITE_OTHER): Payer: PPO | Admitting: Pharmacist

## 2021-04-20 DIAGNOSIS — N1832 Chronic kidney disease, stage 3b: Secondary | ICD-10-CM

## 2021-04-20 DIAGNOSIS — I5032 Chronic diastolic (congestive) heart failure: Secondary | ICD-10-CM | POA: Diagnosis not present

## 2021-04-20 DIAGNOSIS — I480 Paroxysmal atrial fibrillation: Secondary | ICD-10-CM | POA: Diagnosis not present

## 2021-04-20 DIAGNOSIS — I10 Essential (primary) hypertension: Secondary | ICD-10-CM | POA: Diagnosis not present

## 2021-04-20 DIAGNOSIS — E782 Mixed hyperlipidemia: Secondary | ICD-10-CM | POA: Diagnosis not present

## 2021-04-20 DIAGNOSIS — I700 Atherosclerosis of aorta: Secondary | ICD-10-CM

## 2021-04-20 NOTE — Progress Notes (Signed)
Chronic Care Management Pharmacy Note  04/20/2021 Name:  Troy Curenton [name] MRN:  [id number] DOB:  11/20/1940  Summary: -Pt endorses compliance with meds as prescribed and denies issues -He reports he got his Covid booster last Friday (7/15)  Recommendations/Changes made from today's visit: -Advised to get Shingrix and Tetanus booster at pharmacy   Subjective: Troy Wehmeyer [name] is an 80 y.o. year old male who is a primary patient of Burns, Claudina Lick, MD.  The CCM team was consulted for assistance with disease management and care coordination needs.    Engaged with patient by telephone for follow up visit in response to provider referral for pharmacy case management and/or care coordination services.   Consent to Services:  The patient was given information about Chronic Care Management services, agreed to services, and gave verbal consent prior to initiation of services.  Please see initial visit note for detailed documentation.   Patient Care Team: Binnie Rail, MD as PCP - General (Internal Medicine) Thompson Grayer, MD as PCP - Electrophysiology (Cardiology) Everardo All, MD (Hematology and Oncology) Lerry Paterson, MD as Referring Physician (Thoracic Surgery) Larey Dresser, MD as Consulting Physician (Cardiology) Susa Day, MD as Consulting Physician (Orthopedic Surgery) Bond, Tracie Harrier, MD as Referring Physician (Ophthalmology) Charlton Haws, [facility] as Pharmacist (Pharmacist)   Patient lives at home alone since his wife passed recently. He has several hired caregivers who sit with him most days, and several family members who keep up with him as well. His caregivers set up a monthly pill box for him, combining [employer] pill packs with his OTC items.  Recent office visits: 01/30/21 Dr Quay Burow OV: chronic f/u; no med changes  08/02/20 Burns (PCP) - f/u depression. S/p tapering fluoxetine, starting lexapro. Discontinued Fluoxetine, f/u 6 mo  Recent  consult visits: 04/19/21 NP [facility] (cardiology): take extra lasix x 2 days;    04/18/21 Dr Benay Spice (oncology): f/u colon cancer/lung cancer, in remission. No changes.  02/28/21 Dr Aundra Dubin (cardiology): increase lasix from 40 to 60 mg BID  02/09/21 Dr Benay Spice (oncology): f/u lung cancer. Referred to pulmonary to evaluate lung nodules.  12/29/20 Simmons,PA-C (Cardiology) - increase lasix to 40 mg BID; f/u BMP 1 week, f/u 3 mo provider 12/07/20 Dr Aundra Dubin (cardiology): HF clinic. Started Jardiance 10 mg; increased Lasix to 40 mg AM and 20 mg PM. 11/16/20 [place],FNP (Cardiology) - increase lasix 60 mg 2x day for two days then back to 20 mg BID 10/18/20 Mansouraty (Gastro): f/u hx colon cancer; decrease omeprazole from 40 to 20 mg daily   Hospital visits: None in previous 6 months  Objective:  Lab Results  Component Value Date   CREATININE 1.72 (H) 04/19/2021   BUN 19 04/19/2021   GFR 54.19 (L) 11/30/2019   GFRNONAA 40 (L) 04/19/2021   GFRAA 74 05/16/2020   NA 136 04/19/2021   K 4.0 04/19/2021   CALCIUM 8.9 04/19/2021   CO2 26 04/19/2021   GLUCOSE 103 (H) 04/19/2021    Lab Results  Component Value Date/Time   HGBA1C 5.9 (A) 07/09/2019 10:28 AM   HGBA1C 6.3 (H) 12/03/2018 02:23 PM   HGBA1C 5.9 11/04/2017 02:44 PM   HGBA1C 6.1 02/10/2015 10:51 AM   GFR 54.19 (L) 11/30/2019 03:16 PM   GFR 52.56 (L) 07/14/2018 05:16 PM   MICROALBUR 0.4 09/29/2007 10:59 AM    Last diabetic Eye exam: No results found for: HMDIABEYEEXA  Last diabetic Foot exam: No results found for: HMDIABFOOTEX   Lab Results  Component Value Date   CHOL 101 12/07/2020   HDL 34 (L) 12/07/2020   LDLCALC 31 12/07/2020   TRIG 179 (H) 12/07/2020   CHOLHDL 3.0 12/07/2020    Hepatic Function Latest Ref Rng & Units 12/09/2019 09/04/2019 12/03/2018  Total Protein 6.5 - 8.1 g/dL 7.3 7.0 7.0  Albumin 3.5 - 5.0 g/dL 3.6 3.6 3.2(L)  AST 15 - 41 U/L _0 ALT 0 - 44 U/L _1 Alk Phosphatase 38 - 126 U/L 82 100 117   Total Bilirubin 0.3 - 1.2 mg/dL 0.8 0.6 0.6  Bilirubin, Direct 0.0 - 0.2 mg/dL 0.2 - -    Lab Results  Component Value Date/Time   TSH 2.05 06/02/2020 03:16 PM   TSH 1.166 04/21/2020 02:39 PM   TSH 2.48 11/30/2019 03:16 PM   FREET4 0.92 06/02/2020 03:16 PM   FREET4 1.12 11/30/2019 03:16 PM    CBC Latest Ref Rng & Units 02/09/2021 11/10/2020 10/18/2020  WBC 4.0 - 10.5 K/uL 4.7 5.5 6.1  Hemoglobin 13.0 - 17.0 g/dL 14.3 12.4(L) 13.9  Hematocrit 39.0 - 52.0 % 43.8 38.3(L) 41.1  Platelets 150 - 400 K/uL 248 231 278.0    No results found for: VD25OH  Clinical ASCVD: Yes  The ASCVD Risk score Mikey Bussing DC Jr., et al., 2013) failed to calculate for the following reasons:   The 2013 ASCVD risk score is only valid for ages 41 to 10    Depression screen PHQ 2/9 08/02/2020  Decreased Interest 0  Down, Depressed, Hopeless 0  PHQ - 2 Score 0  Some recent data might be hidden    CHA2DS2-VASc Score =    The patient's score is based upon:        Social History   Tobacco Use  Smoking Status Former   Packs/day: 0.50   Years: 50.00   Pack years: 25.00   Types: Cigarettes   Quit date: 07/28/2014   Years since quitting: 6.7  Smokeless Tobacco Never   BP Readings from Last 3 Encounters:  04/19/21 128/60  04/18/21 137/60  03/01/21 (!) 150/70   Pulse Readings from Last 3 Encounters:  04/19/21 (!) 56  04/18/21 64  03/01/21 73   Wt Readings from Last 3 Encounters:  04/19/21 202 lb 3.2 oz (91.7 kg)  04/18/21 198 lb 9.6 oz (90.1 kg)  03/01/21 201 lb (91.2 kg)   BMI Readings from Last 3 Encounters:  04/19/21 26.68 kg/m  04/18/21 26.20 kg/m  03/01/21 26.52 kg/m    Assessment/Interventions: Review of patient past medical history, allergies, medications, health status, including review of consultants reports, laboratory and other test data, was performed as part of comprehensive evaluation and provision of chronic care management services.   SDOH:  (Social Determinants of Health)  assessments and interventions performed: Yes  SDOH Screenings   Alcohol Screen: Low Risk    Last Alcohol Screening Score (AUDIT): 3  Depression (PHQ2-9): Low Risk    PHQ-2 Score: 0  Financial Resource Strain: Low Risk    Difficulty of Paying Living Expenses: Not hard at all  Food Insecurity: No Food Insecurity   Worried About Charity fundraiser in the Last Year: Never true   Ran Out of Food in the Last Year: Never true  Housing: Low Risk    Last Housing Risk Score: 0  Physical Activity: Insufficiently Active   Days of Exercise per Week: 3 days   Minutes of Exercise per Session: 30 min  Social Connections: Not on file  Stress: No Stress Concern Present   Feeling of Stress : Not at all  Tobacco Use: Medium Risk   Smoking Tobacco Use: Former   Smokeless Tobacco Use: Never  Transportation Needs: No Transportation Needs   Lack of Transportation (Medical): No   Lack of Transportation (Non-Medical): No    CCM Care Plan  Allergies  Allergen Reactions   Niacin-Lovastatin Er Shortness Of Breath and Other (See Comments)    Dyspnea, flushing   Penicillins Hives, Shortness Of Breath and Other (See Comments)    Flushing  & dyspnea Because of a history of documented adverse serious drug reaction;Medi Alert bracelet  is recommended PCN reaction causing immediate rash, facial/tongue/throat swelling, SOB or lightheadedness with hypotension: Yes PCN reaction causing severe rash involving mucus membranes or skin necrosis: No PCN reaction occurring within the last 10 years: NO PCN reaction that required hospitalization: NO   Sulfonamide Derivatives Hives, Shortness Of Breath and Other (See Comments)    Flushing & dyspnea Because of a history of documented adverse serious drug reaction;Medi Alert bracelet  is recommended   Atorvastatin Other (See Comments)    Myalgias & athralgias- takes Crestor, DOES NOT LIKE LIPITOR    Medications Reviewed Today     Reviewed by Charlton Haws,  [facility] (Pharmacist) on 04/20/21 at 1615  Med List Status: <None>   Medication Order Taking? Sig Documenting Provider Last Dose Status Informant  amLODipine (NORVASC) 5 MG tablet [id number] Yes TAKE ONE TABLET ONCE DAILY, Larey Dresser, MD Taking Active   Brinzolamide-Brimonidine [facility]) 1-0.2 % SUSP [id number] Yes Place 1 drop into both eyes 2 times daily. [provider] Taking Active   docusate sodium (COLACE) 100 MG capsule [id number] Yes Take 100 mg by mouth 2 (two) times daily.  [provider] Taking Active Spouse/Significant Other  ELIQUIS 5 MG TABS tablet [id number] Yes TAKE ONE TABLET BY MOUTH TWICE DAILY. Larey Dresser, MD Taking Active   empagliflozin (JARDIANCE) 10 MG TABS tablet [id number] Yes Take 1 tablet (10 mg total) by mouth daily before breakfast. Larey Dresser, MD Taking Active   escitalopram (LEXAPRO) 10 MG tablet [id number] Yes TAKE ONE TABLET ONCE DAILY Burns, Claudina Lick, MD Taking Active   famotidine (PEPCID) 20 MG tablet [id number] Yes Take 20 mg by mouth daily as needed for heartburn. [provider] Taking Active   furosemide (LASIX) 20 MG tablet [id number] Yes Take 3 tablets (60 mg total) by mouth 2 (two) times daily. Larey Dresser, MD Taking Active   latanoprost (XALATAN) 0.005 % ophthalmic solution [id number] Yes  [provider] Taking Active   losartan (COZAAR) 25 MG tablet [id number] Yes Take 1 tablet (25 mg total) by mouth daily. Larey Dresser, MD Taking Active   Melatonin 10 MG CAPS [id number] Yes Take 10 mg by mouth at bedtime. [provider] Taking Active Spouse/Significant Other  metoprolol succinate (TOPROL-XL) 25 MG 24 hr tablet [id number] Yes TAKE 1/2 TABLET IN THE EVENING WITH OR IMMEDIATELY FOLLOWING A MEAL Larey Dresser, MD Taking Active   montelukast (SINGULAIR) 10 MG tablet [id number] Yes TAKE ONE TABLET AT BEDTIME. Binnie Rail, MD Taking Active   Multiple Vitamins-Minerals (CENTRUM SILVER PO) [id number]  Yes Take 1 tablet by mouth daily. [provider] Taking Active Spouse/Significant Other  omeprazole (PRILOSEC) 20 MG capsule [id number] Yes Take 20 mg by mouth daily. [provider] Taking Active   Polyethyl Glycol-Propyl Glycol 0.4-0.3 % SOLN [id number] Yes Place 1 drop into both eyes daily as  needed (for dry eyes).  [provider] Taking Active Spouse/Significant Other  potassium chloride SA (KLOR-CON) 20 MEQ tablet [id number] Yes TAKE ONE TABLET ONCE DAILY. Larey Dresser, MD Taking Active   Probiotic Product (ALIGN) 4 [place] [id number] Yes Take by mouth. [provider] Taking Active   rosuvastatin (CRESTOR) 40 MG tablet [id number] Yes TAKE ONE TABLET ONCE DAILY Larey Dresser, MD Taking Active   senna (SENOKOT) 8.6 MG tablet [id number] Yes Take 1 tablet by mouth 2 (two) times daily. [provider] Taking Active Spouse/Significant Other  SYMBICORT 160-4.5 MCG/ACT inhaler [id number] Yes USE 2 PUFFS TWICE DAILY. Binnie Rail, MD Taking Active             Patient Active Problem List   Diagnosis Date Noted   Chronic anticoagulation 10/18/2020   Hx of adenomatous colonic polyps 10/18/2020   History of gastric polyps 10/18/2020   Iron deficiency 10/18/2020   History of anemia 10/18/2020   Gastric intestinal metaplasia 10/18/2020   Aortic atherosclerosis ([place]) 08/01/2020   Secondary hypercoagulable state ([place]) 03/22/2020   Personal history of colon cancer 01/08/2020   Epigastric pain 01/08/2020   Abnormal findings on esophagogastroduodenoscopy (EGD) 01/08/2020   Calculus of gallbladder without cholecystitis without obstruction 01/08/2020   Vertigo 11/25/2019   Cancer of sigmoid colon ([place]) 12/12/2018   Pleural effusion on right 07/15/2018   Colon adenocarcinoma ([place]) 06/30/2018   Acute GI bleeding 06/23/2018   Depression 05/06/2018   Syncope 01/29/2018   Chronic diastolic CHF (congestive heart failure) ([place]) 05/04/2017   CAD (coronary  artery disease) 05/03/2017   MGUS (monoclonal gammopathy of unknown significance) 03/07/2016   Benign essential HTN 01/19/2016   OSA (obstructive sleep apnea) 08/29/2015   PAF (paroxysmal atrial fibrillation) ([place])    Glaucoma 06/22/2015   DOE (dyspnea on exertion) 02/04/2015   Memory loss, short term 08/03/2014   Occlusion and stenosis of carotid artery without mention of cerebral infarction 07/09/2013   COPD GOLD II  07/09/2013   Chronic renal disease, stage 3, moderately decreased glomerular filtration rate between 30-59 mL/min/1.73 square meter ([place]) 07/06/2013   TIA (transient ischemic attack) 07/06/2013   Basal cell carcinoma of skin 10/10/2012   History of lung cancer 03/11/2012   PAD (peripheral artery disease) ([place]) 12/16/2011   AAA (abdominal aortic aneurysm) ([place]) 09/27/2011   Squamous cell lung cancer ([place]) 11/08/2010   GAIT IMBALANCE 03/02/2009   Prediabetes 03/02/2009   HYPERPLASIA PROSTATE UNS W/O UR OBST & OTH LUTS 01/15/2008   HYPERLIPIDEMIA 09/29/2007   Peripheral vascular disease ([place]) 09/29/2007   LEUKOPLAKIA, VOCAL CORDS 09/29/2007   FEMORAL BRUIT 09/29/2007    Immunization History  Administered Date(s) Administered   Fluad Quad(high Dose 65+) 08/11/2020   Influenza Split 07/05/2017   Influenza Whole 07/28/2010   Influenza,inj,Quad PF,6+ Mos 07/07/2013, 07/30/2014   Influenza-Unspecified 07/02/2015   PFIZER(Purple Top)SARS-COV-2 Vaccination 10/18/2019, 11/08/2019, 06/14/2020, 04/14/2021   Pneumococcal Conjugate-13 11/28/2015   Pneumococcal Polysaccharide-23 10/02/2007   Td 03/14/2010    Conditions to be addressed/monitored:  Hypertension, Hyperlipidemia, Atrial Fibrillation, Heart Failure, Coronary Artery Disease, GERD, COPD, Chronic Kidney Disease and Depression  Care Plan : CCM Pharmacy Care Plan  Updates made by Charlton Haws, [place] since 04/20/2021 12:00 AM     Problem: Hypertension, Hyperlipidemia, Atrial Fibrillation, Heart Failure,  Coronary Artery Disease, CKD   Priority: High     Long-Range Goal: Disease management   Start Date: 01/20/2021  Expected End Date: 07/22/2021  This Visit's Progress: On track  Recent  Progress: On track  Priority: High  Note:   Current Barriers:  Unable to independently monitor therapeutic efficacy Unable to maintain control of constipation  Pharmacist Clinical Goal(s):  Patient will achieve adherence to monitoring guidelines and medication adherence to achieve therapeutic efficacy through collaboration with PharmD and provider.   Interventions: 1:1 collaboration with Binnie Rail, MD regarding development and update of comprehensive plan of care as evidenced by provider attestation and co-signature Inter-disciplinary care team collaboration (see longitudinal plan of care) Comprehensive medication review performed; medication list updated in electronic medical record  Hyperlipidemia: (LDL goal < 70) -Hx TIA, PAD, CAD (aortic atherosclerosis) -Controlled - LDL is at goal, pt denies side effects -Current treatment: Rosuvastatin 40 mg daily -Educated on Cholesterol goals;  Benefits of statin for ASCVD risk reduction; -Recommended to continue current medication  Atrial Fibrillation (Goal: prevent stroke and major bleeding) -Controlled - pt reports Afib episodes are rare since ablation; he denies bleeding issues -CHADSVASC: 6 -s/p ablation 04/2019 and redo 01/2020 -hx GI bleed 06/2018 related to colon cancer -Current treatment: Rate control: Metoprolol succinate 25 mg - 1/2 tab daily Anticoagulation: Eliquis 5 mg BID -Home BP and HR readings: not checking  -Counseled on increased risk of stroke due to Afib and benefits of anticoagulation for stroke prevention; importance of adherence to anticoagulant exactly as prescribed; bleeding risk associated with Eliquis and importance of self-monitoring for signs/symptoms of bleeding; -Recommended to continue current medication  Heart  Failure / HTN (Goal: BP < 140/90, prevent exacerbations) -Relatively controlled - pt reports frequent urination on furosemide, but swelling and shortness of breath are stable now -Last ejection fraction: 60-65% (Date: 01/13/2020) -HF type: Diastolic -NYHA Class: III (marked limitation of activity) -Current treatment: Amlodipine 5 mg daily Furosemide 20 mg - 2 tab BID Losartan 25 mg daily Metoprolol succinate 25 mg - 1/2 tab daily Jardiance 10 mg daily [place]-Con 20 mEq daily -Recommended to continue current medication  Health Maintenance -Vaccine gaps: TDAP, Shingrix -Advised to get TD booster and Shingrix at pharmacy  Patient Goals/Self-Care Activities Patient will:  - take medications as prescribed -Focus on medication adherence by pill box -weigh daily, and contact provider if weight gain of 3+ lbs overnight -engage in dietary modifications by minimizing salt  -Get Shingrix and TD booster at pharmacy       Medication Assistance: None required.  Patient affirms current coverage meets needs.  Compliance/Adherence/Medication fill history: Care Gaps: Shingrix TDAP (due 03/14/20)  Star-Rating Drugs: Jardiance - LF 03/26/21 x 30 ds Losartan - LF 03/18/21 x 28 ds Rosuvastatin - LF 03/18/21 x 28 ds  Patient's preferred pharmacy is:  [place], [place] [address] Phone: [phone] Fax: [phone]  Uses pill box? Yes Pt endorses 100% compliance His caregivers set up a monthly pill box for him, combining [employer] pill packs with his OTC items.  We discussed: Current pharmacy is preferred with insurance plan and patient is satisfied with pharmacy services Patient decided to: Continue current medication management strategy  Care Plan and Follow Up Patient Decision:  Patient agrees to Care Plan and Follow-up.  Plan: Telephone follow up appointment with care management team member  scheduled for:  6 months  Charlene Brooke, PharmD, [place], CPP Clinical Pharmacist [facility] at [facility] [phone]

## 2021-04-20 NOTE — Patient Instructions (Signed)
Visit Information  Phone number for Pharmacist: [phone]   Goals Addressed             This Visit's Progress    Manage My Medicine       Timeframe:  Long-Range Goal Priority:  Medium Start Date:         01/20/21                    Expected End Date:      07/30/21                 Follow Up Date 04/29/21   - call for medicine refill 2 or 3 days before it runs out - call if I am sick and can't take my medicine - keep a list of all the medicines I take; vitamins and herbals too - use a pillbox to sort medicine  -Get Shingrix and Tetanus booster at pharmacy   Why is this important?   These steps will help you keep on track with your medicines.   Notes:         Patient verbalizes understanding of instructions provided today and agrees to view in [place].  Telephone follow up appointment with pharmacy team member scheduled for: 6 months  Charlene Brooke, PharmD, [place], CPP Clinical Pharmacist [facility] at [facility] [phone]

## 2021-04-25 ENCOUNTER — Ambulatory Visit (INDEPENDENT_AMBULATORY_CARE_PROVIDER_SITE_OTHER): Payer: PPO

## 2021-04-25 DIAGNOSIS — I4891 Unspecified atrial fibrillation: Secondary | ICD-10-CM

## 2021-04-25 LAB — CUP PACEART REMOTE DEVICE CHECK
Date Time Interrogation Session: 20220726010924
Implantable Pulse Generator Implant Date: 20200709

## 2021-04-30 ENCOUNTER — Other Ambulatory Visit: Payer: Self-pay | Admitting: Gastroenterology

## 2021-05-15 ENCOUNTER — Other Ambulatory Visit (HOSPITAL_COMMUNITY): Payer: Self-pay | Admitting: *Deleted

## 2021-05-22 NOTE — Progress Notes (Signed)
Carelink Summary Report / Loop Recorder 

## 2021-05-25 ENCOUNTER — Encounter: Payer: Self-pay | Admitting: Pulmonary Disease

## 2021-05-25 ENCOUNTER — Other Ambulatory Visit: Payer: Self-pay

## 2021-05-25 ENCOUNTER — Ambulatory Visit: Payer: PPO | Admitting: Pulmonary Disease

## 2021-05-25 VITALS — BP 126/60 | HR 59 | Temp 98.6°F | Ht 73.0 in | Wt 200.4 lb

## 2021-05-25 DIAGNOSIS — J449 Chronic obstructive pulmonary disease, unspecified: Secondary | ICD-10-CM | POA: Diagnosis not present

## 2021-05-25 DIAGNOSIS — R918 Other nonspecific abnormal finding of lung field: Secondary | ICD-10-CM

## 2021-05-25 DIAGNOSIS — R911 Solitary pulmonary nodule: Secondary | ICD-10-CM

## 2021-05-25 DIAGNOSIS — Z85038 Personal history of other malignant neoplasm of large intestine: Secondary | ICD-10-CM

## 2021-05-25 DIAGNOSIS — Z85118 Personal history of other malignant neoplasm of bronchus and lung: Secondary | ICD-10-CM | POA: Diagnosis not present

## 2021-05-25 DIAGNOSIS — R942 Abnormal results of pulmonary function studies: Secondary | ICD-10-CM

## 2021-05-25 NOTE — Patient Instructions (Signed)
Thank you for visiting Dr. Valeta Harms at [facility]. Today we recommend the following:  Orders Placed This Encounter  Procedures   CT Super D Chest Wo Contrast   Please schedule in 4 months CT CHEST WO CONTRAST in December 2022  Return in about 4 months (around 09/24/2021) for with APP or Dr. Valeta Harms. After CT Chest complete    Please do your part to reduce the spread of COVID-19.

## 2021-05-25 NOTE — Progress Notes (Signed)
Synopsis: Referred in June 2022 for abnormal CT chest, PCP: By Binnie Rail, MD  Subjective:   PATIENT ID: Troy [name]. GENDER: male DOB: [birth year], MRN: [id number]  Chief Complaint  Patient presents with   Follow-up    Pt states he has been doing okay since last visit. States that he still has SOB.    Is an 80 year old gentleman, past medical history of atrial fibrillation, AAA, BPH, chronic diastolic heart failure, emphysema, history of lung cancer, April 2012, squamous cell carcinoma status post right lower lobectomy at [facility], no chemo no radiation no recurrence, hyperlipidemia, hypertension.  Patient has no respiratory complaints today.  He is anxious about this slow-growing lesion that has been followed for some time.  He has several within the right chest, none within the left chest.  Nuclear medicine pet imaging on 02/08/2021 completed revealing a bandlike hypermetabolic nodules within the inferior aspect of the right upper lobe that have slowly increased over time this nodularity area concerning for malignancy as well as a hypermetabolic nodule just inferior to the SVC within the right upper mediastinum concerning for malignancy as well as a small nodule 13 mm in size with low-level metabolic uptake SUV of 1.4 less concerning for malignancy.  However due to his history of lung cancer status post lobectomy and colon cancer they have been following these areas closely and medical oncology.  OV 05/25/2021: Here today for follow-up regarding lung nodules.  Has no respiratory complaints today.  Had CT scan in June.  Here today for follow-up.  Lung nodule appears stable in comparison to his previous imaging.   Past Medical History:  Diagnosis Date   AAA (abdominal aortic aneurysm) ([place]) LAST ABDOMINAL US 10-20-17 3.3 CM   MONITORED BY DR [name]   Allergy    Anxiety    Arthritis    Atrial fibrillation (HCC)    Basal cell carcinoma    Bilateral carotid artery stenosis DUPLEX  12-29-2012  BY DR [facility]   BILATERAL ICA STENOSIS 60-79%   BPH (benign prostatic hypertrophy)    Cataract    removed both eyes   CHF (congestive heart failure) (HCC)    Chronic diastolic heart failure (HCC)    Chronic kidney disease    stage 3, pt unaware   Colon cancer ([place])    Complication of anesthesia    had nausea after carotid artery surgery, states the medicine given to help the nausea, made it worse   Constipation    COPD (chronic obstructive pulmonary disease) (HCC)    Depression    Emphysema of lung (HCC)    GERD (gastroesophageal reflux disease)    Glaucoma BOTH EYES   Dr Gershon Crane   History of basal cell carcinoma excision    History of lung cancer APRIL 2012  SQUAMOUS CELL---- S/P RIGHT LOWER LOBECTOMY AT [facility] --  NO CHEMORADIATION---  NO RECURRENCE    ONCOLOGIST- DR Tressie Stalker  LOV IN [facility] 10-27-2012   History of pneumothorax    pt unaware   Hx of adenomatous colonic polyps 2005    X 2; 1 hyperplastic polyp; Dr Olevia Perches   Hyperlipidemia    Hypertension    Impaired fasting glucose 2007   108; A1c5.4%   Lesion of bladder    Lung cancer ([place]) 01/25/2012   Microhematuria    OSA (obstructive sleep apnea) 08/29/2015   CPAP SET ON 10   PAD (peripheral artery disease) ([place]) ABI'S  JAN 2014  0.65 ON RIGHT ;  1.04 ON  LEFT   Peripheral vascular disease (HCC) S/P ANGIOPLASTY AND STENTING   FOLLOWED  BY DR [name]   Pleural effusion on right 11/18/2018   Chronic, noted on CXR   Sleep apnea    + cpap    Spinal stenosis Sept. 2015   Status post placement of implantable loop recorder    Stroke [facility]) Jul 07, 2013   mini  TIA   Thoracic aorta atherosclerosis (HCC)    Thyrotoxicosis    amiodarone induced   Urgency of urination      Family History  Problem Relation Age of Onset   Stroke Mother        mini strokes   Alcohol abuse Father    Heart disease Father        MI after 75   Stroke Father    Hypertension Father    Heart attack Father    Heart disease Paternal  Aunt        several   Hypertension Paternal Aunt        several   Stroke Paternal Aunt        several   Stroke Paternal Uncle        several   Heart disease Paternal Uncle        several;2 had MI pre 39   Cancer Daughter 30       breast ca, also with benign sessile polyp    Colon cancer Daughter 43   Colon polyps Neg Hx    Esophageal cancer Neg Hx    Rectal cancer Neg Hx    Stomach cancer Neg Hx      Past Surgical History:  Procedure Laterality Date   ANGIO PLASTY     X 4 in legs   AORTOGRAM  07-27-2002   MILD DIFFUSE ILIAC ARTERY OCCLUSIVE DISEASE /  LEFT RENAL ARTERY 20%/ PATENT LEFT FEM-POP GRAFT/ MILD SFA AND POPLITEAL ARTERY OCCLUSIVE DISEASE W/ SEVERE KIDNEY OCCLUSIVE DISEASE   ATRIAL FIBRILLATION ABLATION N/A 04/09/2019   Procedure: ATRIAL FIBRILLATION ABLATION;  Surgeon: Thompson Grayer, MD;  Location: [place] CV LAB;  Service: Cardiovascular;  Laterality: N/A;   ATRIAL FIBRILLATION ABLATION N/A 02/12/2020   Procedure: ATRIAL FIBRILLATION ABLATION;  Surgeon: Thompson Grayer, MD;  Location: [place] CV LAB;  Service: Cardiovascular;  Laterality: N/A;   BASAL CELL CARCINOMA EXCISION     MULTIPLE TIMES--  RIGHT FOREARM, CHEEKS, AND BACK   BIOPSY  06/25/2018   Procedure: BIOPSY;  Surgeon: Irving Copas., MD;  Location: [place];  Service: Gastroenterology;;   CARDIOVASCULAR STRESS TEST  12-08-2012  DR [facility]   NORMAL LEXISCAN WITH NO EXERCISE NUCLEAR STUDY/ EF 66%/   NO ISCHEMIA/ NO SIGNIFICANT CHANGE FROM PRIOR STUDY   CARDIOVERSION N/A 02/14/2018   Procedure: CARDIOVERSION;  Surgeon: Larey Dresser, MD;  Location: [place];  Service: Cardiovascular;  Laterality: N/A;   CAROTID ANGIOGRAM N/A 07/10/2013   Procedure: CAROTID ANGIOGRAM;  Surgeon: Elam Dutch, MD;  Location: [facility] CATH LAB;  Service: Cardiovascular;  Laterality: N/A;   CAROTID ENDARTERECTOMY Right 07-14-13   cea   CATARACT EXTRACTION W/ INTRAOCULAR LENS  IMPLANT, BILATERAL     colon  polyectomy     COLONOSCOPY     COLONOSCOPY WITH PROPOFOL N/A 06/25/2018   Procedure: COLONOSCOPY WITH PROPOFOL;  Surgeon: Rush Landmark Telford Nab., MD;  Location: [place];  Service: Gastroenterology;  Laterality: N/A;   CYSTOSCOPY W/ RETROGRADES Bilateral 01/21/2013   Procedure: CYSTOSCOPY WITH RETROGRADE PYELOGRAM;  Surgeon: Molli Hazard,  MD;  Location: [place];  Service: Urology;  Laterality: Bilateral;   CYSTO, BLADDER BIOPSY, BILATERAL RETROGRADE PYELOGRAM  RAD TECH FROM RADIOLOGY PER JOY   CYSTOSCOPY WITH BIOPSY N/A 01/21/2013   Procedure: CYSTOSCOPY WITH BIOPSY;  Surgeon: Molli Hazard, MD;  Location: [facility];  Service: Urology;  Laterality: N/A;   ENDARTERECTOMY Right 07/14/2013   Procedure: ENDARTERECTOMY CAROTID;  Surgeon: Angelia Mould, MD;  Location: [place];  Service: Vascular;  Laterality: Right;   EP IMPLANTABLE DEVICE N/A 08/12/2015   Procedure: Loop Recorder Insertion;  Surgeon: Thompson Grayer, MD;  Location: [place] CV LAB;  Service: Cardiovascular;  Laterality: N/A;   ESOPHAGOGASTRODUODENOSCOPY (EGD) WITH PROPOFOL N/A 06/25/2018   Procedure: ESOPHAGOGASTRODUODENOSCOPY (EGD) WITH PROPOFOL;  Surgeon: Rush Landmark Telford Nab., MD;  Location: [place];  Service: Gastroenterology;  Laterality: N/A;   ESOPHAGOGASTRODUODENOSCOPY (EGD) WITH PROPOFOL N/A 07/10/2018   Procedure: ESOPHAGOGASTRODUODENOSCOPY (EGD) WITH PROPOFOL;  Surgeon: Milus Banister, MD;  Location: [facility];  Service: Endoscopy;  Laterality: N/A;   EUS N/A 07/10/2018   Procedure: UPPER ENDOSCOPIC ULTRASOUND (EUS) RADIAL;  Surgeon: Milus Banister, MD;  Location: [facility];  Service: Endoscopy;  Laterality: N/A;   EYE SURGERY Right    FEMORAL-POPLITEAL BYPASS GRAFT Left [place]   AND 2001  IN [place]   FEMORAL-POPLITEAL BYPASS GRAFT Left 08/30/2015   Procedure: REVISION OF BYPASS GRAFT Left  FEMORAL-POPLITEAL ARTERY;  Surgeon:  Angelia Mould, MD;  Location: [place];  Service: Vascular;  Laterality: Left;   FINE NEEDLE ASPIRATION N/A 07/10/2018   Procedure: FINE NEEDLE ASPIRATION (FNA) LINEAR;  Surgeon: Milus Banister, MD;  Location: [facility];  Service: Endoscopy;  Laterality: N/A;   FLEXIBLE SIGMOIDOSCOPY N/A 12/12/2018   Procedure: FLEXIBLE SIGMOIDOSCOPY;  Surgeon: Ileana Roup, MD;  Location: [facility];  Service: General;  Laterality: N/A;   LARYNGOSCOPY  06-27-2004   BX VOCAL CORD  (LEUKOPLAKIA)  PER PT NO ISSUES SINCE   LEFT HEART CATH AND CORONARY ANGIOGRAPHY N/A 08/20/2017   Procedure: LEFT HEART CATH AND CORONARY ANGIOGRAPHY;  Surgeon: Larey Dresser, MD;  Location: [place] CV LAB;  Service: Cardiovascular;  Laterality: N/A;   LOOP RECORDER INSERTION N/A 04/09/2019   Procedure: LOOP RECORDER INSERTION;  Surgeon: Thompson Grayer, MD;  Location: [place] CV LAB;  Service: Cardiovascular;  Laterality: N/A;   LOOP RECORDER REMOVAL N/A 04/09/2019   Procedure: LOOP RECORDER REMOVAL;  Surgeon: Thompson Grayer, MD;  Location: [place] CV LAB;  Service: Cardiovascular;  Laterality: N/A;   LOWER EXTREMITY ANGIOGRAM Bilateral 08/29/2015   Procedure: Lower Extremity Angiogram;  Surgeon: Angelia Mould, MD;  Location: [place] CV LAB;  Service: Cardiovascular;  Laterality: Bilateral;   LUNG LOBECTOMY  01/24/2011    RIGHT UPPER LOBE  (SQUAMOUS CELL CARCINOMA) Dr Dorthea Cove , [facility]. No chemotherapyor radiation   PATCH ANGIOPLASTY Right 07/14/2013   Procedure: PATCH ANGIOPLASTY;  Surgeon: Angelia Mould, MD;  Location: [place];  Service: Vascular;  Laterality: Right;   PATCH ANGIOPLASTY Left 08/30/2015   Procedure: VEIN PATCH ANGIOPLASTY OF PROXIMAL Left BYPASS GRAFT;  Surgeon: Angelia Mould, MD;  Location: [place];  Service: Vascular;  Laterality: Left;   PERIPHERAL VASCULAR CATHETERIZATION N/A 08/29/2015   Procedure: Abdominal Aortogram;  Surgeon: Angelia Mould, MD;  Location: [place] [facility] LAB;  Service: Cardiovascular;  Laterality: N/A;   POLYPECTOMY  06/25/2018   Procedure: POLYPECTOMY;  Surgeon: Rush Landmark Telford Nab., MD;  Location: [facility]  ENDOSCOPY;  Service: Gastroenterology;;   POLYPECTOMY     SUBMUCOSAL INJECTION  06/25/2018   Procedure: SUBMUCOSAL INJECTION;  Surgeon: Irving Copas., MD;  Location: [place];  Service: Gastroenterology;;   TEE WITHOUT CARDIOVERSION N/A 02/14/2018   Procedure: TRANSESOPHAGEAL ECHOCARDIOGRAM (TEE);  Surgeon: Larey Dresser, MD;  Location: [facility] ENDOSCOPY;  Service: Cardiovascular;  Laterality: N/A;   trabecular surgery     OS   TRANSTHORACIC ECHOCARDIOGRAM  12-29-2012  DR [facility]   MILD LVH/  LVSF NORMAL/ EF 53-97%/  GRADE I DIASTOLIC DYSFUNCTION    Social History   Socioeconomic History   Marital status: Married    Spouse name: [name]    Number of children: 3   Years of education: 12+   Highest education level: Not on file  Occupational History   Occupation: Retired    Comment: Owns a [employer], Advertising account executive, 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  Tobacco Use   Smoking status: Former    Packs/day: 0.50    Years: 50.00    Pack years: 25.00    Types: Cigarettes    Quit date: 07/28/2014    Years since quitting: 6.8   Smokeless tobacco: Never  Vaping Use   Vaping Use: Never used  Substance and Sexual Activity   Alcohol use: Yes    Alcohol/week: 4.0 standard drinks    Types: 1 Glasses of wine, 1 Cans of beer, 1 Shots of liquor, 1 Standard drinks or equivalent per week    Comment:  socially, variable   Drug use: No   Sexual activity: Not on file  Other Topics Concern   Not on file  Social History Narrative   Patient lives at home with spouse [name]   Patient has 3 children    Patient is right handed    Social Determinants of Health   Financial Resource Strain: Low Risk    Difficulty of Paying Living Expenses: Not hard at all  Food Insecurity: No  Food Insecurity   Worried About Charity fundraiser in the Last Year: Never true   Arboriculturist in the Last Year: Never true  Transportation Needs: No Transportation Needs   Lack of Transportation (Medical): No   Lack of Transportation (Non-Medical): No  Physical Activity: Insufficiently Active   Days of Exercise per Week: 3 days   Minutes of Exercise per Session: 30 min  Stress: No Stress Concern Present   Feeling of Stress : Not at all  Social Connections: Not on file  Intimate Partner Violence: Not on file     Allergies  Allergen Reactions   Niacin-Lovastatin Er Shortness Of Breath and Other (See Comments)    Dyspnea, flushing   Penicillins Hives, Shortness Of Breath and Other (See Comments)    Flushing  & dyspnea Because of a history of documented adverse serious drug reaction;Medi Alert bracelet  is recommended PCN reaction causing immediate rash, facial/tongue/throat swelling, SOB or lightheadedness with hypotension: Yes PCN reaction causing severe rash involving mucus membranes or skin necrosis: No PCN reaction occurring within the last 10 years: NO PCN reaction that required hospitalization: NO   Sulfonamide Derivatives Hives, Shortness Of Breath and Other (See Comments)    Flushing & dyspnea Because of a history of documented adverse serious drug reaction;Medi Alert bracelet  is recommended   Atorvastatin Other (See Comments)    Myalgias & athralgias- takes Crestor, DOES NOT LIKE LIPITOR     Outpatient Medications  Prior to Visit  Medication Sig Dispense Refill   amLODipine (NORVASC) 5 MG tablet TAKE ONE TABLET ONCE DAILY, 60 tablet 1   Brinzolamide-Brimonidine (SIMBRINZA) 1-0.2 % SUSP Place 1 drop into both eyes 2 times daily.     clindamycin (CLEOCIN) 300 MG capsule Take 300 mg by mouth 3 (three) times daily.     docusate sodium (COLACE) 100 MG capsule Take 100 mg by mouth 2 (two) times daily.      ELIQUIS 5 MG TABS tablet TAKE ONE TABLET BY MOUTH TWICE DAILY. 120  tablet 1   empagliflozin (JARDIANCE) 10 MG TABS tablet Take 1 tablet (10 mg total) by mouth daily before breakfast. 30 tablet 11   escitalopram (LEXAPRO) 10 MG tablet TAKE ONE TABLET ONCE DAILY 28 tablet 5   famotidine (PEPCID) 20 MG tablet Take 20 mg by mouth daily as needed for heartburn.     furosemide (LASIX) 20 MG tablet Take 3 tablets (60 mg total) by mouth 2 (two) times daily. 180 tablet 11   latanoprost (XALATAN) 0.005 % ophthalmic solution      losartan (COZAAR) 25 MG tablet Take 1 tablet (25 mg total) by mouth daily. 90 tablet 3   Melatonin 10 MG CAPS Take 10 mg by mouth at bedtime.     metoprolol succinate (TOPROL-XL) 25 MG 24 hr tablet TAKE 1/2 TABLET IN THE EVENING WITH OR IMMEDIATELY FOLLOWING A MEAL 30 tablet 1   montelukast (SINGULAIR) 10 MG tablet TAKE ONE TABLET AT BEDTIME. 28 tablet 5   Multiple Vitamins-Minerals (CENTRUM SILVER PO) Take 1 tablet by mouth daily.     omeprazole (PRILOSEC) 20 MG capsule TAKE (1) CAPSULE TWICE DAILY. 56 capsule 3   Polyethyl Glycol-Propyl Glycol 0.4-0.3 % SOLN Place 1 drop into both eyes daily as needed (for dry eyes).      potassium chloride SA (KLOR-CON) 20 MEQ tablet TAKE ONE TABLET ONCE DAILY. 60 tablet 1   Probiotic Product (ALIGN) 4 MG CAPS Take by mouth.     rosuvastatin (CRESTOR) 40 MG tablet TAKE ONE TABLET ONCE DAILY 60 tablet 1   senna (SENOKOT) 8.6 MG tablet Take 1 tablet by mouth 2 (two) times daily.     SYMBICORT 160-4.5 MCG/ACT inhaler USE 2 PUFFS TWICE DAILY. 10.2 g 11   No facility-administered medications prior to visit.    Review of Systems  Constitutional:  Negative for chills, fever, malaise/fatigue and weight loss.  HENT:  Negative for hearing loss, sore throat and tinnitus.   Eyes:  Negative for blurred vision and double vision.  Respiratory:  Negative for cough, hemoptysis, sputum production, shortness of breath, wheezing and stridor.   Cardiovascular:  Negative for chest pain, palpitations, orthopnea, leg swelling  and PND.  Gastrointestinal:  Negative for abdominal pain, constipation, diarrhea, heartburn, nausea and vomiting.  Genitourinary:  Negative for dysuria, hematuria and urgency.  Musculoskeletal:  Negative for joint pain and myalgias.  Skin:  Negative for itching and rash.  Neurological:  Negative for dizziness, tingling, weakness and headaches.  Endo/Heme/Allergies:  Negative for environmental allergies. Does not bruise/bleed easily.  Psychiatric/Behavioral:  Negative for depression. The patient is not nervous/anxious and does not have insomnia.   All other systems reviewed and are negative.   Objective:  Physical Exam Vitals reviewed.  Constitutional:      General: He is not in acute distress.    Appearance: He is well-developed.  HENT:     Head: Normocephalic and atraumatic.  Eyes:     General: No scleral  icterus.    Conjunctiva/sclera: Conjunctivae normal.     Pupils: Pupils are equal, round, and reactive to light.  Neck:     Vascular: No JVD.     Trachea: No tracheal deviation.  Cardiovascular:     Rate and Rhythm: Normal rate and regular rhythm.     Heart sounds: Normal heart sounds. No murmur heard. Pulmonary:     Effort: Pulmonary effort is normal. No tachypnea, accessory muscle usage or respiratory distress.     Breath sounds: No stridor. No wheezing, rhonchi or rales.     Comments: Diminished breath sounds in the right base Abdominal:     General: Bowel sounds are normal. There is no distension.     Palpations: Abdomen is soft.     Tenderness: There is no abdominal tenderness.  Musculoskeletal:        General: No tenderness.     Cervical back: Neck supple.  Lymphadenopathy:     Cervical: No cervical adenopathy.  Skin:    General: Skin is warm and dry.     Capillary Refill: Capillary refill takes less than 2 seconds.     Findings: No rash.  Neurological:     Mental Status: He is alert and oriented to person, place, and time.  Psychiatric:        Behavior:  Behavior normal.     Vitals:   05/25/21 1039  BP: 126/60  Pulse: (!) 59  Temp: 98.6 F (37 C)  TempSrc: Oral  SpO2: 96%  Weight: 200 lb 6.4 oz (90.9 kg)  Height: 6\' 1"  (1.854 m)   96% on RA BMI Readings from Last 3 Encounters:  05/25/21 26.44 kg/m  04/19/21 26.68 kg/m  04/18/21 26.20 kg/m   Wt Readings from Last 3 Encounters:  05/25/21 200 lb 6.4 oz (90.9 kg)  04/19/21 202 lb 3.2 oz (91.7 kg)  04/18/21 198 lb 9.6 oz (90.1 kg)     CBC    Component Value Date/Time   WBC 4.7 02/09/2021 1136   WBC 5.5 11/10/2020 1435   RBC 4.61 02/09/2021 1136   HGB 14.3 02/09/2021 1136   HGB 12.4 (L) 05/16/2020 1615   HGB 11.8 (L) 03/07/2016 1213   HCT 43.8 02/09/2021 1136   HCT 37.9 05/16/2020 1615   HCT 36.3 (L) 03/07/2016 1213   PLT 248 02/09/2021 1136   PLT 329 05/16/2020 1615   MCV 95.0 02/09/2021 1136   MCV 94 05/16/2020 1615   MCV 85.0 03/07/2016 1213   MCH 31.0 02/09/2021 1136   MCHC 32.6 02/09/2021 1136   RDW 13.9 02/09/2021 1136   RDW 14.2 05/16/2020 1615   RDW 14.7 (H) 03/07/2016 1213   LYMPHSABS 0.6 (L) 02/09/2021 1136   LYMPHSABS 0.9 05/16/2020 1615   LYMPHSABS 0.8 (L) 03/07/2016 1213   MONOABS 0.3 02/09/2021 1136   MONOABS 0.8 03/07/2016 1213   EOSABS 0.1 02/09/2021 1136   EOSABS 0.1 05/16/2020 1615   BASOSABS 0.0 02/09/2021 1136   BASOSABS 0.0 05/16/2020 1615   BASOSABS 0.0 03/07/2016 1213    Chest Imaging: 02/08/2021: Nuclear medicine pet imaging Hypermetabolic bandlike nodules within the right upper lobe along the fissure.  A smaller 13 mm nodule anterior right upper lobe.  Also has a right superior mediastinal node just adjacent to the SVC. All have been followed for some time on CT imaging concern for potential malignancy. The patient's images have been independently reviewed by me.    03/29/2021: CT scan of the chest: Stable right upper lobe pulmonary nodule.  The patient's images have been independently reviewed by me.    Pulmonary Functions  Testing Results: PFT Results Latest Ref Rng & Units 08/13/2017 10/04/2016  FVC-Pre L 3.10 3.14  FVC-Predicted Pre % 67 67  FVC-Post L - 3.26  FVC-Predicted Post % - 70  Pre FEV1/FVC % % 64 62  Post FEV1/FCV % % - 64  FEV1-Pre L 1.98 1.96  FEV1-Predicted Pre % 59 58  FEV1-Post L - 2.10  DLCO uncorrected ml/min/mmHg 12.40 14.56  DLCO UNC% % 34 40  DLCO corrected ml/min/mmHg - 14.44  DLCO COR %Predicted % - 40  DLVA Predicted % 56 59  TLC L 7.39 5.77  TLC % Predicted % 97 76  RV % Predicted % 156 92    FeNO:   Pathology:   Echocardiogram:   Heart Catheterization:     Assessment & Plan:     ICD-10-CM   1. Lung nodule  R91.1 CT Super D Chest Wo Contrast    2. Abnormal CT scan, lung  R91.8     3. Abnormal PET scan, lung  R94.2     4. COPD GOLD II   J44.9     5. Personal history of colon cancer  Z85.038     6. History of lung cancer  Z85.118       Discussion:  This is a 80 year old gentleman, history of lung cancer status post right lower lobectomy, history of colon cancer now with an abnormal CT scan of the lung and abnormal PET scan followed for right upper lobe pulmonary nodule.  There was low-level hypermetabolic uptake within the right upper lobe bandlike lesion and small solitary lesion within the anterior portion of the right upper lobe.  This is now stable on follow-up CT imaging.  I still think we need close follow-up  Plan: We will start with a repeat noncontrasted CT scan of the chest in December 2022. If there is any change in this lesion then I think we can consider biopsy. Patient is agreeable to this plan. We will continue to follow closely primarily due to his previous history of malignancy    Current Outpatient Medications:    amLODipine (NORVASC) 5 MG tablet, TAKE ONE TABLET ONCE DAILY,, Disp: 60 tablet, Rfl: 1   Brinzolamide-Brimonidine (SIMBRINZA) 1-0.2 % SUSP, Place 1 drop into both eyes 2 times daily., Disp: , Rfl:    clindamycin (CLEOCIN)  300 MG capsule, Take 300 mg by mouth 3 (three) times daily., Disp: , Rfl:    docusate sodium (COLACE) 100 MG capsule, Take 100 mg by mouth 2 (two) times daily. , Disp: , Rfl:    ELIQUIS 5 MG TABS tablet, TAKE ONE TABLET BY MOUTH TWICE DAILY., Disp: 120 tablet, Rfl: 1   empagliflozin (JARDIANCE) 10 MG TABS tablet, Take 1 tablet (10 mg total) by mouth daily before breakfast., Disp: 30 tablet, Rfl: 11   escitalopram (LEXAPRO) 10 MG tablet, TAKE ONE TABLET ONCE DAILY, Disp: 28 tablet, Rfl: 5   famotidine (PEPCID) 20 MG tablet, Take 20 mg by mouth daily as needed for heartburn., Disp: , Rfl:    furosemide (LASIX) 20 MG tablet, Take 3 tablets (60 mg total) by mouth 2 (two) times daily., Disp: 180 tablet, Rfl: 11   latanoprost (XALATAN) 0.005 % ophthalmic solution, , Disp: , Rfl:    losartan (COZAAR) 25 MG tablet, Take 1 tablet (25 mg total) by mouth daily., Disp: 90 tablet, Rfl: 3   Melatonin 10 MG CAPS, Take 10 mg by mouth  at bedtime., Disp: , Rfl:    metoprolol succinate (TOPROL-XL) 25 MG 24 hr tablet, TAKE 1/2 TABLET IN THE EVENING WITH OR IMMEDIATELY FOLLOWING A MEAL, Disp: 30 tablet, Rfl: 1   montelukast (SINGULAIR) 10 MG tablet, TAKE ONE TABLET AT BEDTIME., Disp: 28 tablet, Rfl: 5   Multiple Vitamins-Minerals (CENTRUM SILVER PO), Take 1 tablet by mouth daily., Disp: , Rfl:    omeprazole (PRILOSEC) 20 MG capsule, TAKE (1) CAPSULE TWICE DAILY., Disp: 56 capsule, Rfl: 3   Polyethyl Glycol-Propyl Glycol 0.4-0.3 % SOLN, Place 1 drop into both eyes daily as needed (for dry eyes). , Disp: , Rfl:    potassium chloride SA (KLOR-CON) 20 MEQ tablet, TAKE ONE TABLET ONCE DAILY., Disp: 60 tablet, Rfl: 1   Probiotic Product (ALIGN) 4 MG CAPS, Take by mouth., Disp: , Rfl:    rosuvastatin (CRESTOR) 40 MG tablet, TAKE ONE TABLET ONCE DAILY, Disp: 60 tablet, Rfl: 1   senna (SENOKOT) 8.6 MG tablet, Take 1 tablet by mouth 2 (two) times daily., Disp: , Rfl:    SYMBICORT 160-4.5 MCG/ACT inhaler, USE 2 PUFFS TWICE  DAILY., Disp: 10.2 g, Rfl: 11   Garner Nash, DO [facility] 05/25/2021 11:24 AM

## 2021-05-29 ENCOUNTER — Ambulatory Visit (INDEPENDENT_AMBULATORY_CARE_PROVIDER_SITE_OTHER): Payer: PPO

## 2021-05-29 DIAGNOSIS — I480 Paroxysmal atrial fibrillation: Secondary | ICD-10-CM

## 2021-05-30 LAB — CUP PACEART REMOTE DEVICE CHECK
Date Time Interrogation Session: 20220828011358
Implantable Pulse Generator Implant Date: 20200709

## 2021-06-08 ENCOUNTER — Encounter: Payer: Self-pay | Admitting: Internal Medicine

## 2021-06-08 ENCOUNTER — Other Ambulatory Visit: Payer: Self-pay

## 2021-06-08 ENCOUNTER — Ambulatory Visit (INDEPENDENT_AMBULATORY_CARE_PROVIDER_SITE_OTHER): Payer: PPO | Admitting: Internal Medicine

## 2021-06-08 VITALS — BP 140/68 | HR 57 | Ht 73.0 in | Wt 203.0 lb

## 2021-06-08 DIAGNOSIS — E0580 Other thyrotoxicosis without thyrotoxic crisis or storm: Secondary | ICD-10-CM | POA: Diagnosis not present

## 2021-06-08 LAB — T3, FREE: T3, Free: 3 pg/mL (ref 2.3–4.2)

## 2021-06-08 LAB — TSH: TSH: 2.19 u[IU]/mL (ref 0.35–5.50)

## 2021-06-08 LAB — T4, FREE: Free T4: 0.75 ng/dL (ref 0.60–1.60)

## 2021-06-08 NOTE — Patient Instructions (Addendum)
Please continue off methimazole.  Please stop at the lab.   Please have your TSH checked yearly.  Please come back for a follow-up appointment as needed in the future.

## 2021-06-08 NOTE — Progress Notes (Signed)
Patient ID: Rosevelt Luu [name], male   DOB: 07/13/1941, 80 y.o.   MRN: [id number]   This visit occurred during the SARS-CoV-2 public health emergency.  Safety protocols were in place, including screening questions prior to the visit, additional usage of staff PPE, and extensive cleaning of exam room while observing appropriate contact time as indicated for disinfecting solutions.   HPI  Denzell Colasanti [name] is a 80 y.o.-year-old male, initially referred by his cardiologist, [name], presenting for follow-up for history of amiodarone-induced thyrotoxicosis.  Last visit was 1 year ago.  Interim history: Since last visit, he did not experience any weight loss, worsening palpitations, heat intolerance, worsening anxiety (he has chronic mild anxiety), insomnia. He was on ABx recently for oral surgery >> diarrhea >> resolved. He has a complicated medical history including lung cancer, colon cancer, atrial fibrillation, diastolic CHF.  Reviewed  history: Patient is seen in the cardiology clinic for several cardiovascular conditions: paroxysmal Afib, Diastolic CHF, CAD.  He also has a history of AAA, PAD, history of TIA.  He was found to have a low TSH at his visit on 07/14/2018.  He had repeat TFTs 9 days later and this showed again a suppressed TSH and also elevated free T4 and free T3.    She was taking a high dose of biotin, 10,000 mcg daily and he took this before his TFTs in 10-08/2018. He had a CT of the chest with contrast on 06/25/2018.  At that time, his amiodarone (that he was taking for almost 1 year) was stopped.    He was started on methimazole 10 mg daily.  He also started Toprol-XL 25 mg daily.  We had to increase the dose of methimazole slightly to 15 mg daily >> but then decreased to 10 mg daily.  He continues on this dose today.  We also started Prednisone 10 mg daily >> we were then able to decrease the dose and finally stop 10/2018.  TFTs normalized in  11/2018.  However, afterwards TSH increased so we decreased her methimazole - in 03/2019: 2.5 mg daily.  In 05/2019, we stopped methimazole completely and his TFTs remained normal. Metoprolol was cut down to 12.5 mg daily.   Reviewed his TFTs: Lab Results  Component Value Date   TSH 2.05 06/02/2020   TSH 1.166 04/21/2020   TSH 2.48 11/30/2019   TSH 3.52 05/29/2019   TSH 5.73 (H) 03/02/2019   TSH 11.765 (H) 01/29/2019   TSH 3.57 11/27/2018   TSH 0.02 (L) 10/27/2018   TSH <0.010 (L) 10/14/2018   TSH <0.01 (L) 09/26/2018   FREET4 0.92 06/02/2020   FREET4 1.12 11/30/2019   FREET4 0.93 05/29/2019   FREET4 0.73 03/02/2019   FREET4 0.70 (L) 01/29/2019   FREET4 0.90 11/27/2018   FREET4 1.34 10/27/2018   FREET4 1.71 10/14/2018   FREET4 2.64 (H) 09/26/2018   FREET4 3.84 (H) 08/27/2018   T3FREE 3.0 06/02/2020   T3FREE 3.4 11/30/2019   T3FREE 3.0 05/29/2019   T3FREE 2.6 03/02/2019   T3FREE 2.9 11/27/2018   T3FREE 3.0 10/27/2018   T3FREE 3.1 10/14/2018   T3FREE 3.9 09/26/2018   T3FREE 5.6 (H) 08/27/2018   T3FREE 4.7 (H) 07/23/2018   His Graves' antibodies were not elevated: Lab Results  Component Value Date   TSI <89 08/27/2018   Pt denies: - feeling nodules in neck - hoarseness - dysphagia - choking - SOB with lying down  Pt does not have a FH of thyroid ds. No  FH of thyroid cancer. No h/o radiation tx to head or neck.  No herbal supplements. No Biotin use. No recent steroids use. He takes Melatonin.  Pt. also has a history of lung cancer, HL, COPD, OSA-on CPAP. He also has a history of colon cancer and had a low anterior resection 11/2018.  No metastases.  No need for chemotherapy and radiation therapy.   His oncologist is Dr. Learta Codding.  ROS: Constitutional: no weight gain/no weight loss, no fatigue, no subjective hyperthermia, no subjective hypothermia Eyes: no blurry vision, no xerophthalmia ENT: no sore throat, + see HPI Cardiovascular: no CP/no SOB/no  palpitations/no leg swelling Respiratory: no cough/no SOB/no wheezing Gastrointestinal: no N/no V/+ D from ABx - now resolved/no C/no acid reflux Musculoskeletal: no muscle aches/no joint aches Skin: no rashes, no hair loss Neurological: no tremors/no numbness/no tingling/+ dizziness  I reviewed pt's medications, allergies, PMH, social hx, family hx, and changes were documented in the history of present illness. Otherwise, unchanged from my initial visit note, except his dose of Lasix was decreased from last visit  Past Medical History:  Diagnosis Date   AAA (abdominal aortic aneurysm) ([facility]) LAST ABDOMINAL US 10-20-17 3.3 CM   MONITORED BY DR [name]   Allergy    Anxiety    Arthritis    Atrial fibrillation ([place])    Basal cell carcinoma    Bilateral carotid artery stenosis DUPLEX 12-29-2012  BY DR [facility]   BILATERAL ICA STENOSIS 60-79%   BPH (benign prostatic hypertrophy)    Cataract    removed both eyes   CHF (congestive heart failure) (HCC)    Chronic diastolic heart failure (HCC)    Chronic kidney disease    stage 3, pt unaware   Colon cancer ([place])    Complication of anesthesia    had nausea after carotid artery surgery, states the medicine given to help the nausea, made it worse   Constipation    COPD (chronic obstructive pulmonary disease) (HCC)    Depression    Emphysema of lung (HCC)    GERD (gastroesophageal reflux disease)    Glaucoma BOTH EYES   Dr Gershon Crane   History of basal cell carcinoma excision    History of lung cancer APRIL 2012  SQUAMOUS CELL---- S/P RIGHT LOWER LOBECTOMY AT [facility] --  NO CHEMORADIATION---  NO RECURRENCE    ONCOLOGIST- DR Tressie Stalker  LOV IN [facility] 10-27-2012   History of pneumothorax    pt unaware   Hx of adenomatous colonic polyps 2005    X 2; 1 hyperplastic polyp; Dr Olevia Perches   Hyperlipidemia    Hypertension    Impaired fasting glucose 2007   108; A1c5.4%   Lesion of bladder    Lung cancer ([place]) 01/25/2012   Microhematuria    OSA  (obstructive sleep apnea) 08/29/2015   CPAP SET ON 10   PAD (peripheral artery disease) ([place]) ABI'S  JAN 2014  0.65 ON RIGHT ;  1.04 ON LEFT   Peripheral vascular disease (HCC) S/P ANGIOPLASTY AND STENTING   FOLLOWED  BY DR [name]   Pleural effusion on right 11/18/2018   Chronic, noted on CXR   Sleep apnea    + cpap    Spinal stenosis Sept. 2015   Status post placement of implantable loop recorder    Stroke [facility]) Jul 07, 2013   mini  TIA   Thoracic aorta atherosclerosis (HCC)    Thyrotoxicosis    amiodarone induced   Urgency of urination    Past Surgical History:  Procedure Laterality Date   ANGIO PLASTY     X 4 in legs   AORTOGRAM  07-27-2002   MILD DIFFUSE ILIAC ARTERY OCCLUSIVE DISEASE /  LEFT RENAL ARTERY 20%/ PATENT LEFT FEM-POP GRAFT/ MILD SFA AND POPLITEAL ARTERY OCCLUSIVE DISEASE W/ SEVERE KIDNEY OCCLUSIVE DISEASE   ATRIAL FIBRILLATION ABLATION N/A 04/09/2019   Procedure: ATRIAL FIBRILLATION ABLATION;  Surgeon: Thompson Grayer, MD;  Location: [place] CV LAB;  Service: Cardiovascular;  Laterality: N/A;   ATRIAL FIBRILLATION ABLATION N/A 02/12/2020   Procedure: ATRIAL FIBRILLATION ABLATION;  Surgeon: Thompson Grayer, MD;  Location: [place] CV LAB;  Service: Cardiovascular;  Laterality: N/A;   BASAL CELL CARCINOMA EXCISION     MULTIPLE TIMES--  RIGHT FOREARM, CHEEKS, AND BACK   BIOPSY  06/25/2018   Procedure: BIOPSY;  Surgeon: Irving Copas., MD;  Location: [place];  Service: Gastroenterology;;   CARDIOVASCULAR STRESS TEST  12-08-2012  DR [facility]   NORMAL LEXISCAN WITH NO EXERCISE NUCLEAR STUDY/ EF 66%/   NO ISCHEMIA/ NO SIGNIFICANT CHANGE FROM PRIOR STUDY   CARDIOVERSION N/A 02/14/2018   Procedure: CARDIOVERSION;  Surgeon: Larey Dresser, MD;  Location: [place];  Service: Cardiovascular;  Laterality: N/A;   CAROTID ANGIOGRAM N/A 07/10/2013   Procedure: CAROTID ANGIOGRAM;  Surgeon: Elam Dutch, MD;  Location: [facility] CATH LAB;  Service: Cardiovascular;   Laterality: N/A;   CAROTID ENDARTERECTOMY Right 07-14-13   cea   CATARACT EXTRACTION W/ INTRAOCULAR LENS  IMPLANT, BILATERAL     colon polyectomy     COLONOSCOPY     COLONOSCOPY WITH PROPOFOL N/A 06/25/2018   Procedure: COLONOSCOPY WITH PROPOFOL;  Surgeon: Rush Landmark Telford Nab., MD;  Location: [place];  Service: Gastroenterology;  Laterality: N/A;   CYSTOSCOPY W/ RETROGRADES Bilateral 01/21/2013   Procedure: CYSTOSCOPY WITH RETROGRADE PYELOGRAM;  Surgeon: Molli Hazard, MD;  Location: [facility];  Service: Urology;  Laterality: Bilateral;   CYSTO, BLADDER BIOPSY, BILATERAL RETROGRADE PYELOGRAM  RAD TECH FROM RADIOLOGY PER JOY   CYSTOSCOPY WITH BIOPSY N/A 01/21/2013   Procedure: CYSTOSCOPY WITH BIOPSY;  Surgeon: Molli Hazard, MD;  Location: [facility];  Service: Urology;  Laterality: N/A;   ENDARTERECTOMY Right 07/14/2013   Procedure: ENDARTERECTOMY CAROTID;  Surgeon: Angelia Mould, MD;  Location: [place];  Service: Vascular;  Laterality: Right;   EP IMPLANTABLE DEVICE N/A 08/12/2015   Procedure: Loop Recorder Insertion;  Surgeon: Thompson Grayer, MD;  Location: [place] CV LAB;  Service: Cardiovascular;  Laterality: N/A;   ESOPHAGOGASTRODUODENOSCOPY (EGD) WITH PROPOFOL N/A 06/25/2018   Procedure: ESOPHAGOGASTRODUODENOSCOPY (EGD) WITH PROPOFOL;  Surgeon: Rush Landmark Telford Nab., MD;  Location: [place];  Service: Gastroenterology;  Laterality: N/A;   ESOPHAGOGASTRODUODENOSCOPY (EGD) WITH PROPOFOL N/A 07/10/2018   Procedure: ESOPHAGOGASTRODUODENOSCOPY (EGD) WITH PROPOFOL;  Surgeon: Milus Banister, MD;  Location: [facility];  Service: Endoscopy;  Laterality: N/A;   EUS N/A 07/10/2018   Procedure: UPPER ENDOSCOPIC ULTRASOUND (EUS) RADIAL;  Surgeon: Milus Banister, MD;  Location: [facility];  Service: Endoscopy;  Laterality: N/A;   EYE SURGERY Right    FEMORAL-POPLITEAL BYPASS GRAFT Left [place]   AND 2001  IN  [place]   FEMORAL-POPLITEAL BYPASS GRAFT Left 08/30/2015   Procedure: REVISION OF BYPASS GRAFT Left  FEMORAL-POPLITEAL ARTERY;  Surgeon: Angelia Mould, MD;  Location: [place];  Service: Vascular;  Laterality: Left;   FINE NEEDLE ASPIRATION N/A 07/10/2018   Procedure: FINE NEEDLE ASPIRATION (FNA) LINEAR;  Surgeon: Milus Banister, MD;  Location: [facility];  Service: Endoscopy;  Laterality: N/A;   FLEXIBLE SIGMOIDOSCOPY N/A 12/12/2018   Procedure: FLEXIBLE SIGMOIDOSCOPY;  Surgeon: Ileana Roup, MD;  Location: [facility];  Service: General;  Laterality: N/A;   LARYNGOSCOPY  06-27-2004   BX VOCAL CORD  (LEUKOPLAKIA)  PER PT NO ISSUES SINCE   LEFT HEART CATH AND CORONARY ANGIOGRAPHY N/A 08/20/2017   Procedure: LEFT HEART CATH AND CORONARY ANGIOGRAPHY;  Surgeon: Larey Dresser, MD;  Location: [place] CV LAB;  Service: Cardiovascular;  Laterality: N/A;   LOOP RECORDER INSERTION N/A 04/09/2019   Procedure: LOOP RECORDER INSERTION;  Surgeon: Thompson Grayer, MD;  Location: [place] CV LAB;  Service: Cardiovascular;  Laterality: N/A;   LOOP RECORDER REMOVAL N/A 04/09/2019   Procedure: LOOP RECORDER REMOVAL;  Surgeon: Thompson Grayer, MD;  Location: [place] CV LAB;  Service: Cardiovascular;  Laterality: N/A;   LOWER EXTREMITY ANGIOGRAM Bilateral 08/29/2015   Procedure: Lower Extremity Angiogram;  Surgeon: Angelia Mould, MD;  Location: [place] CV LAB;  Service: Cardiovascular;  Laterality: Bilateral;   LUNG LOBECTOMY  01/24/2011    RIGHT UPPER LOBE  (SQUAMOUS CELL CARCINOMA) Dr Dorthea [place] , [facility]. No chemotherapyor radiation   PATCH ANGIOPLASTY Right 07/14/2013   Procedure: PATCH ANGIOPLASTY;  Surgeon: Angelia Mould, MD;  Location: [place];  Service: Vascular;  Laterality: Right;   PATCH ANGIOPLASTY Left 08/30/2015   Procedure: VEIN PATCH ANGIOPLASTY OF PROXIMAL Left BYPASS GRAFT;  Surgeon: Angelia Mould, MD;  Location: [place];  Service: Vascular;  Laterality: Left;    PERIPHERAL VASCULAR CATHETERIZATION N/A 08/29/2015   Procedure: Abdominal Aortogram;  Surgeon: Angelia Mould, MD;  Location: [place] CV LAB;  Service: Cardiovascular;  Laterality: N/A;   POLYPECTOMY  06/25/2018   Procedure: POLYPECTOMY;  Surgeon: Rush Landmark Telford Nab., MD;  Location: [place];  Service: Gastroenterology;;   POLYPECTOMY     SUBMUCOSAL INJECTION  06/25/2018   Procedure: SUBMUCOSAL INJECTION;  Surgeon: Irving Copas., MD;  Location: [place];  Service: Gastroenterology;;   TEE WITHOUT CARDIOVERSION N/A 02/14/2018   Procedure: TRANSESOPHAGEAL ECHOCARDIOGRAM (TEE);  Surgeon: Larey Dresser, MD;  Location: [facility] ENDOSCOPY;  Service: Cardiovascular;  Laterality: N/A;   trabecular surgery     OS   TRANSTHORACIC ECHOCARDIOGRAM  12-29-2012  DR [facility]   MILD LVH/  LVSF NORMAL/ EF 95-09%/  GRADE I DIASTOLIC DYSFUNCTION   Social History   Socioeconomic History   Marital status: Married    Spouse name: [name]    Number of children: 3   Years of education: 12+   Highest education level: Not on file  Occupational History   Occupation: Retired    Comment: Owns a [employer], Advertising account executive, 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  Tobacco Use   Smoking status: Former    Packs/day: 0.50    Years: 50.00    Pack years: 25.00    Types: Cigarettes    Quit date: 07/28/2014    Years since quitting: 6.8   Smokeless tobacco: Never  Vaping Use   Vaping Use: Never used  Substance and Sexual Activity   Alcohol use: Yes    Alcohol/week: 4.0 standard drinks    Types: 1 Glasses of wine, 1 Cans of beer, 1 Shots of liquor, 1 Standard drinks or equivalent per week    Comment:  socially, variable   Drug use: No   Sexual activity: Not on file  Other Topics Concern  Not on file  Social History Narrative   Patient lives at home with spouse [name]   Patient has 3 children    Patient is right handed    Social  Determinants of Health   Financial Resource Strain: Low Risk    Difficulty of Paying Living Expenses: Not hard at all  Food Insecurity: No Food Insecurity   Worried About Charity fundraiser in the Last Year: Never true   Arboriculturist in the Last Year: Never true  Transportation Needs: No Transportation Needs   Lack of Transportation (Medical): No   Lack of Transportation (Non-Medical): No  Physical Activity: Insufficiently Active   Days of Exercise per Week: 3 days   Minutes of Exercise per Session: 30 min  Stress: No Stress Concern Present   Feeling of Stress : Not at all  Social Connections: Not on file  Intimate Partner Violence: Not on file   Current Outpatient Medications on File Prior to Visit  Medication Sig Dispense Refill   amLODipine (NORVASC) 5 MG tablet TAKE ONE TABLET ONCE DAILY, 60 tablet 1   Brinzolamide-Brimonidine (SIMBRINZA) 1-0.2 % SUSP Place 1 drop into both eyes 2 times daily.     clindamycin (CLEOCIN) 300 MG capsule Take 300 mg by mouth 3 (three) times daily.     docusate sodium (COLACE) 100 MG capsule Take 100 mg by mouth 2 (two) times daily.      ELIQUIS 5 MG TABS tablet TAKE ONE TABLET BY MOUTH TWICE DAILY. 120 tablet 1   empagliflozin (JARDIANCE) 10 MG TABS tablet Take 1 tablet (10 mg total) by mouth daily before breakfast. 30 tablet 11   escitalopram (LEXAPRO) 10 MG tablet TAKE ONE TABLET ONCE DAILY 28 tablet 5   famotidine (PEPCID) 20 MG tablet Take 20 mg by mouth daily as needed for heartburn.     furosemide (LASIX) 20 MG tablet Take 3 tablets (60 mg total) by mouth 2 (two) times daily. 180 tablet 11   latanoprost (XALATAN) 0.005 % ophthalmic solution      losartan (COZAAR) 25 MG tablet Take 1 tablet (25 mg total) by mouth daily. 90 tablet 3   Melatonin 10 MG CAPS Take 10 mg by mouth at bedtime.     metoprolol succinate (TOPROL-XL) 25 MG 24 hr tablet TAKE 1/2 TABLET IN THE EVENING WITH OR IMMEDIATELY FOLLOWING A MEAL 30 tablet 1   montelukast  (SINGULAIR) 10 MG tablet TAKE ONE TABLET AT BEDTIME. 28 tablet 5   Multiple Vitamins-Minerals (CENTRUM SILVER PO) Take 1 tablet by mouth daily.     omeprazole (PRILOSEC) 20 MG capsule TAKE (1) CAPSULE TWICE DAILY. 56 capsule 3   Polyethyl Glycol-Propyl Glycol 0.4-0.3 % SOLN Place 1 drop into both eyes daily as needed (for dry eyes).      potassium chloride SA (KLOR-CON) 20 MEQ tablet TAKE ONE TABLET ONCE DAILY. 60 tablet 1   Probiotic Product (ALIGN) 4 MG CAPS Take by mouth.     rosuvastatin (CRESTOR) 40 MG tablet TAKE ONE TABLET ONCE DAILY 60 tablet 1   senna (SENOKOT) 8.6 MG tablet Take 1 tablet by mouth 2 (two) times daily.     SYMBICORT 160-4.5 MCG/ACT inhaler USE 2 PUFFS TWICE DAILY. 10.2 g 11   No current facility-administered medications on file prior to visit.   Allergies  Allergen Reactions   Niacin-Lovastatin Er Shortness Of Breath and Other (See Comments)    Dyspnea, flushing   Penicillins Hives, Shortness Of Breath and Other (See Comments)  Flushing  & dyspnea Because of a history of documented adverse serious drug reaction;Medi Alert bracelet  is recommended PCN reaction causing immediate rash, facial/tongue/throat swelling, SOB or lightheadedness with hypotension: Yes PCN reaction causing severe rash involving mucus membranes or skin necrosis: No PCN reaction occurring within the last 10 years: NO PCN reaction that required hospitalization: NO   Sulfonamide Derivatives Hives, Shortness Of Breath and Other (See Comments)    Flushing & dyspnea Because of a history of documented adverse serious drug reaction;Medi Alert bracelet  is recommended   Atorvastatin Other (See Comments)    Myalgias & athralgias- takes Crestor, DOES NOT LIKE LIPITOR   Family History  Problem Relation Age of Onset   Stroke Mother        mini strokes   Alcohol abuse Father    Heart disease Father        MI after 89   Stroke Father    Hypertension Father    Heart attack Father    Heart disease  Paternal Aunt        several   Hypertension Paternal Aunt        several   Stroke Paternal Aunt        several   Stroke Paternal Uncle        several   Heart disease Paternal Uncle        several;2 had MI pre 47   Cancer Daughter 12       breast ca, also with benign sessile polyp    Colon cancer Daughter 24   Colon polyps Neg Hx    Esophageal cancer Neg Hx    Rectal cancer Neg Hx    Stomach cancer Neg Hx     PE: BP 140/68 (BP Location: Right Arm, Patient Position: Sitting, Cuff Size: Normal)   Pulse (!) 57   Ht 6\' 1"  (1.854 m)   Wt 203 lb (92.1 kg)   SpO2 90%   BMI 26.78 kg/m  Wt Readings from Last 3 Encounters:  06/08/21 203 lb (92.1 kg)  05/25/21 200 lb 6.4 oz (90.9 kg)  04/19/21 202 lb 3.2 oz (91.7 kg)   Constitutional: Slightly overweight, in NAD, shuffling gait Eyes: PERRLA, EOMI, no exophthalmos ENT: moist mucous membranes, no thyromegaly, no cervical lymphadenopathy Cardiovascular: RRR, No MRG Respiratory: CTA B Gastrointestinal: abdomen soft, NT, ND, BS+ Musculoskeletal: no deformities, strength intact in all 4 Skin: moist, warm, no rashes Neurological: no tremor with outstretched hands, DTR normal in all 4  ASSESSMENT: 1.  Amiodarone-induced thyrotoxicosis (AIT)  PLAN:  1. Patient with history of amiodarone-induced thyrotoxicosis, without thyrotoxic symptoms other than possibly weight loss (which, he mentions, was intentional).  He did not have heat intolerance, palpitations, anxiety, hypertension patient.  He does have a history of paroxysmal atrial fibrillation was on amiodarone around the time of his initial investigation for thyrotoxicosis.  At that time he was also taking a high dose of biotin.  However, the thyroid tests remained abnormal even after stopping the supplement.  Also, amiodarone was stopped in ~06/2018.  -We initially started methimazole and then also added prednisone 10 mg daily.  He did well afterwards and we were able to decrease the  medication doses.  We ended up stopping prednisone completely in 10/2018 and stopping methimazole completely in 05/2019. -His latest set of tests were normal at last visit -He continues on Toprol-XL 12.5 mg daily for his A. fib -Last visit, he was already feeling better, without tremors and with improved palpitations.  He continues to feel well now, without weight loss, palpitations, heat intolerance, tremors. -At today's visit, we will check his TFTs to see if he needs to restart methimazole.  -If his tests are normal, he can continue to follow with PCP, we tests checked every year and return to see me if they become abnormal again  Component     Latest Ref Rng & Units 06/08/2021  TSH     0.35 - 5.50 uIU/mL 2.19  Triiodothyronine,Free,Serum     2.3 - 4.2 pg/mL 3.0  T4,Free(Direct)     0.60 - 1.60 ng/dL 0.75  TFTs are normal.  Continue with the plan above.  Philemon Kingdom, MD PhD [facility]

## 2021-06-09 NOTE — Progress Notes (Signed)
Carelink Summary Report / Loop Recorder 

## 2021-06-19 ENCOUNTER — Other Ambulatory Visit: Payer: Self-pay

## 2021-06-19 ENCOUNTER — Inpatient Hospital Stay: Payer: PPO | Attending: Oncology

## 2021-06-19 DIAGNOSIS — C187 Malignant neoplasm of sigmoid colon: Secondary | ICD-10-CM | POA: Insufficient documentation

## 2021-06-19 LAB — CEA (ACCESS): CEA (CHCC): 1.88 ng/mL (ref 0.00–5.00)

## 2021-06-20 LAB — CEA (IN HOUSE-CHCC): CEA (CHCC-In House): 1.19 ng/mL (ref 0.00–5.00)

## 2021-06-21 ENCOUNTER — Telehealth: Payer: Self-pay

## 2021-06-21 NOTE — Telephone Encounter (Signed)
Relayed message to Pt about normal CEA results. Pt verbalized understanding. Pt scheduled to return in January 2023

## 2021-06-21 NOTE — Telephone Encounter (Signed)
-----   Message from Ladell Pier, MD sent at 06/19/2021  5:04 PM EDT ----- Please call patient, CEA is normal, he should have an office visit scheduled for 6 months from last visit

## 2021-06-26 ENCOUNTER — Telehealth: Payer: PPO | Admitting: Physician Assistant

## 2021-06-26 ENCOUNTER — Other Ambulatory Visit: Payer: Self-pay

## 2021-06-26 ENCOUNTER — Emergency Department (HOSPITAL_BASED_OUTPATIENT_CLINIC_OR_DEPARTMENT_OTHER)
Admission: EM | Admit: 2021-06-26 | Discharge: 2021-06-26 | Disposition: A | Discharge status: Home / Self Care | Payer: PPO | Attending: Emergency Medicine | Admitting: Emergency Medicine

## 2021-06-26 ENCOUNTER — Encounter (HOSPITAL_BASED_OUTPATIENT_CLINIC_OR_DEPARTMENT_OTHER): Payer: Self-pay | Admitting: Emergency Medicine

## 2021-06-26 ENCOUNTER — Telehealth: Payer: Self-pay | Admitting: *Deleted

## 2021-06-26 DIAGNOSIS — N183 Chronic kidney disease, stage 3 unspecified: Secondary | ICD-10-CM | POA: Insufficient documentation

## 2021-06-26 DIAGNOSIS — R059 Cough, unspecified: Secondary | ICD-10-CM | POA: Diagnosis present

## 2021-06-26 DIAGNOSIS — U071 COVID-19: Secondary | ICD-10-CM

## 2021-06-26 DIAGNOSIS — Z87891 Personal history of nicotine dependence: Secondary | ICD-10-CM | POA: Insufficient documentation

## 2021-06-26 DIAGNOSIS — I2510 Atherosclerotic heart disease of native coronary artery without angina pectoris: Secondary | ICD-10-CM | POA: Insufficient documentation

## 2021-06-26 DIAGNOSIS — J449 Chronic obstructive pulmonary disease, unspecified: Secondary | ICD-10-CM | POA: Diagnosis not present

## 2021-06-26 DIAGNOSIS — I130 Hypertensive heart and chronic kidney disease with heart failure and stage 1 through stage 4 chronic kidney disease, or unspecified chronic kidney disease: Secondary | ICD-10-CM | POA: Diagnosis not present

## 2021-06-26 DIAGNOSIS — Z85038 Personal history of other malignant neoplasm of large intestine: Secondary | ICD-10-CM | POA: Diagnosis not present

## 2021-06-26 DIAGNOSIS — Z7901 Long term (current) use of anticoagulants: Secondary | ICD-10-CM | POA: Diagnosis not present

## 2021-06-26 DIAGNOSIS — Z85118 Personal history of other malignant neoplasm of bronchus and lung: Secondary | ICD-10-CM | POA: Diagnosis not present

## 2021-06-26 DIAGNOSIS — Z85828 Personal history of other malignant neoplasm of skin: Secondary | ICD-10-CM | POA: Diagnosis not present

## 2021-06-26 DIAGNOSIS — Z79899 Other long term (current) drug therapy: Secondary | ICD-10-CM | POA: Diagnosis not present

## 2021-06-26 DIAGNOSIS — I5032 Chronic diastolic (congestive) heart failure: Secondary | ICD-10-CM | POA: Diagnosis not present

## 2021-06-26 MED ORDER — MOLNUPIRAVIR EUA 200MG CAPSULE: 4.0000 | ORAL_CAPSULE | Freq: Two times a day (BID) | ORAL | 0 refills | Status: AC

## 2021-06-26 MED ORDER — NIRMATRELVIR/RITONAVIR (PAXLOVID) TABLET (RENAL DOSING): 2.0000 | ORAL_TABLET | Freq: Two times a day (BID) | ORAL | Status: DC

## 2021-06-26 NOTE — Discharge Instructions (Addendum)
You have been prescribed Molnupiravir which is an antiviral medication that assist in treating COVID-19.  This is a 5-day course of medication.    You have tested positive for COVID-19. Please stay home for at least 5 days from symptom onset. Isolate from others in your home. You are likely most infectious during these first 5 days.  You may end isolation after day 5 if you are fever free for 24 hours AND your symptoms are improving. Please continue to wear a mask in public until day 10.   If you have?moderate illness?(if you experienced shortness of breath or had difficulty breathing), or?severe illness?(you were hospitalized) due to COVID-19, or you have a weakened immune system, you need to isolate through day 10.  Please return if you develop difficulty breathing, chest pain, or worsening symptoms after day 5, please return to the emergency department.

## 2021-06-26 NOTE — Telephone Encounter (Signed)
Spoke to pt told him calling to ask some questions prior to virtual visit. Asked pt if he is having any SOB? Pt said yes last night. Pt said cough started on Saturday he was out of town over the weekend. Pt said he is having Chest congestion and some SOB. Pt said he takes 120 mg of lasix daily and is in the bathroom a lot. He did a COVID test this morning which was positive. Asked pt if coughing any thing up? Pt said no. Asked him if having any SOB when he is up and about. Pt said not right now but he is not walking around a lot. Denies fever or chills. Told pt due to his symptoms and having heart failure and being on lasix daily and other health problems. Troy [name] the provider is recommending you to go to the ED now to be evaluated. Told him he can go to [facility] or [facility], wait time at [facility] would probably be less. Pt verbalized understanding and said will go to [name] ED. Told him okay and I will cancel appt. Pt verbalized understanding.

## 2021-06-26 NOTE — ED Notes (Signed)
Pt from home with cough since Saturday. Pt states he had some sob as well. He reports a positive home covid test this morning. Pt called his pcp for a prescription; was given virtual visit and was told to go to ED because of his hx of copd. Pt denies that his symptoms are mild. Pt alert & oriented, nad noted.

## 2021-06-26 NOTE — ED Notes (Signed)
Pt discharged home after verbalizing understanding of discharge instructions; nad noted. 

## 2021-06-26 NOTE — ED Triage Notes (Signed)
Pt arrives to ED with c/o of testing COVID positive. Pt currently has no complaints and he states that he is here because his PCP office told him to come to [facility] for workup. Pt reports very little chest congestion and mild SOB that he states is around his baseline.

## 2021-06-26 NOTE — ED Provider Notes (Signed)
[facility] EMERGENCY DEPT Provider Note   CSN: [id number] Arrival date & time: 06/26/21  1436     History Chief Complaint  Patient presents with   Covid Positive    Troy Proffit [name] is a 80 y.o. male.  Past medical history of hyperlipidemia, CKD stage III, COPD, CHF, history of lung cancer, OSA, coronary artery disease.  He states that he started having cough with chest congestion and tingling in his throat that started on Saturday.  His symptoms worsen which prompted him to take a COVID test earlier this afternoon.  He was positive for COVID.  His daughter called his doctor who urged him to go to the emergency room to be evaluated same day due to his high comorbidities.  Patient states he is feeling well.  He denies any shortness of breath, chest pain, abdominal pain, nausea, vomiting, swelling in his legs, sore throat, congestion.  He is more interested in getting the antiviral medication.  HPI     Past Medical History:  Diagnosis Date   AAA (abdominal aortic aneurysm) ([place]) LAST ABDOMINAL US 10-20-17 3.3 CM   MONITORED BY DR [name]   Allergy    Anxiety    Arthritis    Atrial fibrillation (HCC)    Basal cell carcinoma    Bilateral carotid artery stenosis DUPLEX 12-29-2012  BY DR [facility]   BILATERAL ICA STENOSIS 60-79%   BPH (benign prostatic hypertrophy)    Cataract    removed both eyes   CHF (congestive heart failure) (HCC)    Chronic diastolic heart failure (HCC)    Chronic kidney disease    stage 3, pt unaware   Colon cancer ([place])    Complication of anesthesia    had nausea after carotid artery surgery, states the medicine given to help the nausea, made it worse   Constipation    COPD (chronic obstructive pulmonary disease) (HCC)    Depression    Emphysema of lung (HCC)    GERD (gastroesophageal reflux disease)    Glaucoma BOTH EYES   Dr Gershon Crane   History of basal cell carcinoma excision    History of lung cancer APRIL 2012  SQUAMOUS  CELL---- S/P RIGHT LOWER LOBECTOMY AT [facility] --  NO CHEMORADIATION---  NO RECURRENCE    ONCOLOGIST- DR Tressie Stalker  LOV IN [facility] 10-27-2012   History of pneumothorax    pt unaware   Hx of adenomatous colonic polyps 2005    X 2; 1 hyperplastic polyp; Dr Olevia Perches   Hyperlipidemia    Hypertension    Impaired fasting glucose 2007   108; A1c5.4%   Lesion of bladder    Lung cancer ([place]) 01/25/2012   Microhematuria    OSA (obstructive sleep apnea) 08/29/2015   CPAP SET ON 10   PAD (peripheral artery disease) ([place]) ABI'S  JAN 2014  0.65 ON RIGHT ;  1.04 ON LEFT   Peripheral vascular disease (HCC) S/P ANGIOPLASTY AND STENTING   FOLLOWED  BY DR [name]   Pleural effusion on right 11/18/2018   Chronic, noted on CXR   Sleep apnea    + cpap    Spinal stenosis Sept. 2015   Status post placement of implantable loop recorder    Stroke [facility]) Jul 07, 2013   mini  TIA   Thoracic aorta atherosclerosis (HCC)    Thyrotoxicosis    amiodarone induced   Urgency of urination     Patient Active Problem List   Diagnosis Date Noted   Chronic anticoagulation 10/18/2020  Hx of adenomatous colonic polyps 10/18/2020   History of gastric polyps 10/18/2020   Iron deficiency 10/18/2020   History of anemia 10/18/2020   Gastric intestinal metaplasia 10/18/2020   Aortic atherosclerosis ([place]) 08/01/2020   Secondary hypercoagulable state ([place]) 03/22/2020   Personal history of colon cancer 01/08/2020   Epigastric pain 01/08/2020   Abnormal findings on esophagogastroduodenoscopy (EGD) 01/08/2020   Calculus of gallbladder without cholecystitis without obstruction 01/08/2020   Vertigo 11/25/2019   Cancer of sigmoid colon ([place]) 12/12/2018   Pleural effusion on right 07/15/2018   Colon adenocarcinoma ([place]) 06/30/2018   Acute GI bleeding 06/23/2018   Depression 05/06/2018   Syncope 01/29/2018   Chronic diastolic CHF (congestive heart failure) ([place]) 05/04/2017   CAD (coronary artery disease) 05/03/2017   MGUS  (monoclonal gammopathy of unknown significance) 03/07/2016   Benign essential HTN 01/19/2016   OSA (obstructive sleep apnea) 08/29/2015   PAF (paroxysmal atrial fibrillation) ([place])    Glaucoma 06/22/2015   DOE (dyspnea on exertion) 02/04/2015   Memory loss, short term 08/03/2014   Occlusion and stenosis of carotid artery without mention of cerebral infarction 07/09/2013   COPD GOLD II  07/09/2013   Chronic renal disease, stage 3, moderately decreased glomerular filtration rate between 30-59 mL/min/1.73 square meter ([name]) 07/06/2013   TIA (transient ischemic attack) 07/06/2013   Basal cell carcinoma of skin 10/10/2012   History of lung cancer 03/11/2012   PAD (peripheral artery disease) ([place]) 12/16/2011   AAA (abdominal aortic aneurysm) ([place]) 09/27/2011   Squamous cell lung cancer ([place]) 11/08/2010   GAIT IMBALANCE 03/02/2009   Prediabetes 03/02/2009   HYPERPLASIA PROSTATE UNS W/O UR OBST & OTH LUTS 01/15/2008   HYPERLIPIDEMIA 09/29/2007   Peripheral vascular disease ([place]) 09/29/2007   LEUKOPLAKIA, VOCAL CORDS 09/29/2007   FEMORAL BRUIT 09/29/2007    Past Surgical History:  Procedure Laterality Date   ANGIO PLASTY     X 4 in legs   AORTOGRAM  07-27-2002   MILD DIFFUSE ILIAC ARTERY OCCLUSIVE DISEASE /  LEFT RENAL ARTERY 20%/ PATENT LEFT FEM-POP GRAFT/ MILD SFA AND POPLITEAL ARTERY OCCLUSIVE DISEASE W/ SEVERE KIDNEY OCCLUSIVE DISEASE   ATRIAL FIBRILLATION ABLATION N/A 04/09/2019   Procedure: ATRIAL FIBRILLATION ABLATION;  Surgeon: Thompson Grayer, MD;  Location: [place] CV LAB;  Service: Cardiovascular;  Laterality: N/A;   ATRIAL FIBRILLATION ABLATION N/A 02/12/2020   Procedure: ATRIAL FIBRILLATION ABLATION;  Surgeon: Thompson Grayer, MD;  Location: [place] CV LAB;  Service: Cardiovascular;  Laterality: N/A;   BASAL CELL CARCINOMA EXCISION     MULTIPLE TIMES--  RIGHT FOREARM, CHEEKS, AND BACK   BIOPSY  06/25/2018   Procedure: BIOPSY;  Surgeon: Irving Copas., MD;  Location:  [place];  Service: Gastroenterology;;   CARDIOVASCULAR STRESS TEST  12-08-2012  DR [facility]   NORMAL LEXISCAN WITH NO EXERCISE NUCLEAR STUDY/ EF 66%/   NO ISCHEMIA/ NO SIGNIFICANT CHANGE FROM PRIOR STUDY   CARDIOVERSION N/A 02/14/2018   Procedure: CARDIOVERSION;  Surgeon: Larey Dresser, MD;  Location: [place];  Service: Cardiovascular;  Laterality: N/A;   CAROTID ANGIOGRAM N/A 07/10/2013   Procedure: CAROTID ANGIOGRAM;  Surgeon: Elam Dutch, MD;  Location: [facility] CATH LAB;  Service: Cardiovascular;  Laterality: N/A;   CAROTID ENDARTERECTOMY Right 07-14-13   cea   CATARACT EXTRACTION W/ INTRAOCULAR LENS  IMPLANT, BILATERAL     colon polyectomy     COLONOSCOPY     COLONOSCOPY WITH PROPOFOL N/A 06/25/2018   Procedure: COLONOSCOPY WITH PROPOFOL;  Surgeon: Irving Copas.,  MD;  Location: [place];  Service: Gastroenterology;  Laterality: N/A;   CYSTOSCOPY W/ RETROGRADES Bilateral 01/21/2013   Procedure: CYSTOSCOPY WITH RETROGRADE PYELOGRAM;  Surgeon: Molli Hazard, MD;  Location: [facility];  Service: Urology;  Laterality: Bilateral;   CYSTO, BLADDER BIOPSY, BILATERAL RETROGRADE PYELOGRAM  RAD TECH FROM RADIOLOGY PER JOY   CYSTOSCOPY WITH BIOPSY N/A 01/21/2013   Procedure: CYSTOSCOPY WITH BIOPSY;  Surgeon: Molli Hazard, MD;  Location: [facility];  Service: Urology;  Laterality: N/A;   ENDARTERECTOMY Right 07/14/2013   Procedure: ENDARTERECTOMY CAROTID;  Surgeon: Angelia Mould, MD;  Location: [place];  Service: Vascular;  Laterality: Right;   EP IMPLANTABLE DEVICE N/A 08/12/2015   Procedure: Loop Recorder Insertion;  Surgeon: Thompson Grayer, MD;  Location: [place] CV LAB;  Service: Cardiovascular;  Laterality: N/A;   ESOPHAGOGASTRODUODENOSCOPY (EGD) WITH PROPOFOL N/A 06/25/2018   Procedure: ESOPHAGOGASTRODUODENOSCOPY (EGD) WITH PROPOFOL;  Surgeon: Rush Landmark Telford Nab., MD;  Location: [place];  Service:  Gastroenterology;  Laterality: N/A;   ESOPHAGOGASTRODUODENOSCOPY (EGD) WITH PROPOFOL N/A 07/10/2018   Procedure: ESOPHAGOGASTRODUODENOSCOPY (EGD) WITH PROPOFOL;  Surgeon: Milus Banister, MD;  Location: [facility];  Service: Endoscopy;  Laterality: N/A;   EUS N/A 07/10/2018   Procedure: UPPER ENDOSCOPIC ULTRASOUND (EUS) RADIAL;  Surgeon: Milus Banister, MD;  Location: [facility];  Service: Endoscopy;  Laterality: N/A;   EYE SURGERY Right    FEMORAL-POPLITEAL BYPASS GRAFT Left [place]   AND 2001  IN [place]   FEMORAL-POPLITEAL BYPASS GRAFT Left 08/30/2015   Procedure: REVISION OF BYPASS GRAFT Left  FEMORAL-POPLITEAL ARTERY;  Surgeon: Angelia Mould, MD;  Location: [place];  Service: Vascular;  Laterality: Left;   FINE NEEDLE ASPIRATION N/A 07/10/2018   Procedure: FINE NEEDLE ASPIRATION (FNA) LINEAR;  Surgeon: Milus Banister, MD;  Location: [facility];  Service: Endoscopy;  Laterality: N/A;   FLEXIBLE SIGMOIDOSCOPY N/A 12/12/2018   Procedure: FLEXIBLE SIGMOIDOSCOPY;  Surgeon: Ileana Roup, MD;  Location: [facility];  Service: General;  Laterality: N/A;   LARYNGOSCOPY  06-27-2004   BX VOCAL CORD  (LEUKOPLAKIA)  PER PT NO ISSUES SINCE   LEFT HEART CATH AND CORONARY ANGIOGRAPHY N/A 08/20/2017   Procedure: LEFT HEART CATH AND CORONARY ANGIOGRAPHY;  Surgeon: Larey Dresser, MD;  Location: [place] CV LAB;  Service: Cardiovascular;  Laterality: N/A;   LOOP RECORDER INSERTION N/A 04/09/2019   Procedure: LOOP RECORDER INSERTION;  Surgeon: Thompson Grayer, MD;  Location: [place] CV LAB;  Service: Cardiovascular;  Laterality: N/A;   LOOP RECORDER REMOVAL N/A 04/09/2019   Procedure: LOOP RECORDER REMOVAL;  Surgeon: Thompson Grayer, MD;  Location: [place] CV LAB;  Service: Cardiovascular;  Laterality: N/A;   LOWER EXTREMITY ANGIOGRAM Bilateral 08/29/2015   Procedure: Lower Extremity Angiogram;  Surgeon: Angelia Mould, MD;  Location: [place] CV LAB;  Service:  Cardiovascular;  Laterality: Bilateral;   LUNG LOBECTOMY  01/24/2011    RIGHT UPPER LOBE  (SQUAMOUS CELL CARCINOMA) Dr Dorthea [place] , [facility]. No chemotherapyor radiation   PATCH ANGIOPLASTY Right 07/14/2013   Procedure: PATCH ANGIOPLASTY;  Surgeon: Angelia Mould, MD;  Location: [place];  Service: Vascular;  Laterality: Right;   PATCH ANGIOPLASTY Left 08/30/2015   Procedure: VEIN PATCH ANGIOPLASTY OF PROXIMAL Left BYPASS GRAFT;  Surgeon: Angelia Mould, MD;  Location: [place];  Service: Vascular;  Laterality: Left;   PERIPHERAL VASCULAR CATHETERIZATION N/A 08/29/2015   Procedure: Abdominal Aortogram;  Surgeon: Angelia Mould, MD;  Location: [place] CV LAB;  Service: Cardiovascular;  Laterality: N/A;   POLYPECTOMY  06/25/2018   Procedure: POLYPECTOMY;  Surgeon: Rush Landmark Telford Nab., MD;  Location: [place];  Service: Gastroenterology;;   POLYPECTOMY     SUBMUCOSAL INJECTION  06/25/2018   Procedure: SUBMUCOSAL INJECTION;  Surgeon: Irving Copas., MD;  Location: [place];  Service: Gastroenterology;;   TEE WITHOUT CARDIOVERSION N/A 02/14/2018   Procedure: TRANSESOPHAGEAL ECHOCARDIOGRAM (TEE);  Surgeon: Larey Dresser, MD;  Location: [facility] ENDOSCOPY;  Service: Cardiovascular;  Laterality: N/A;   trabecular surgery     OS   TRANSTHORACIC ECHOCARDIOGRAM  12-29-2012  DR [facility]   MILD LVH/  LVSF NORMAL/ EF 28-36%/  GRADE I DIASTOLIC DYSFUNCTION       Family History  Problem Relation Age of Onset   Stroke Mother        mini strokes   Alcohol abuse Father    Heart disease Father        MI after 77   Stroke Father    Hypertension Father    Heart attack Father    Heart disease Paternal Aunt        several   Hypertension Paternal Aunt        several   Stroke Paternal Aunt        several   Stroke Paternal Uncle        several   Heart disease Paternal Uncle        several;2 had MI pre 6   Cancer Daughter 22       breast ca, also with benign sessile polyp     Colon cancer Daughter 22   Colon polyps Neg Hx    Esophageal cancer Neg Hx    Rectal cancer Neg Hx    Stomach cancer Neg Hx     Social History   Tobacco Use   Smoking status: Former    Packs/day: 0.50    Years: 50.00    Pack years: 25.00    Types: Cigarettes    Quit date: 07/28/2014    Years since quitting: 6.9   Smokeless tobacco: Never  Vaping Use   Vaping Use: Never used  Substance Use Topics   Alcohol use: Yes    Alcohol/week: 4.0 standard drinks    Types: 1 Glasses of wine, 1 Cans of beer, 1 Shots of liquor, 1 Standard drinks or equivalent per week    Comment:  socially, variable   Drug use: No    Home Medications Prior to Admission medications   Medication Sig Start Date End Date Taking? Authorizing Provider  molnupiravir EUA (LAGEVRIO) 200 mg CAPS capsule Take 4 capsules (800 mg total) by mouth 2 (two) times daily for 5 days. 06/26/21 07/01/21 Yes Kristi Hyer, Adora Fridge, PA-C  amLODipine (NORVASC) 5 MG tablet TAKE ONE TABLET ONCE DAILY, 03/09/21   Larey Dresser, MD  Brinzolamide-Brimonidine [facility]) 1-0.2 % SUSP Place 1 drop into both eyes 2 times daily. 04/16/20   [provider]  clindamycin (CLEOCIN) 300 MG capsule Take 300 mg by mouth 3 (three) times daily. 05/17/21   [provider]  docusate sodium (COLACE) 100 MG capsule Take 100 mg by mouth 2 (two) times daily.     [provider]  ELIQUIS 5 MG TABS tablet TAKE ONE TABLET BY MOUTH TWICE DAILY. 03/09/21   Larey Dresser, MD  empagliflozin (JARDIANCE) 10 MG TABS tablet Take 1 tablet (10 mg total) by mouth daily before breakfast. 12/07/20   Aundra Dubin,  Elby Showers, MD  escitalopram (LEXAPRO) 10 MG tablet TAKE ONE TABLET ONCE DAILY 04/05/21   Binnie Rail, MD  famotidine (PEPCID) 20 MG tablet Take 20 mg by mouth daily as needed for heartburn.    [provider]  furosemide (LASIX) 20 MG tablet Take 3 tablets (60 mg total) by mouth 2 (two) times daily. 04/07/21   Larey Dresser, MD   latanoprost Ivin Poot) 0.005 % ophthalmic solution  05/16/20   [provider]  losartan (COZAAR) 25 MG tablet Take 1 tablet (25 mg total) by mouth daily. 12/07/20   Larey Dresser, MD  Melatonin 10 MG CAPS Take 10 mg by mouth at bedtime.    [provider]  metoprolol succinate (TOPROL-XL) 25 MG 24 hr tablet TAKE 1/2 TABLET IN THE EVENING WITH OR IMMEDIATELY FOLLOWING A MEAL 03/09/21   Larey Dresser, MD  montelukast (SINGULAIR) 10 MG tablet TAKE ONE TABLET AT BEDTIME. 04/05/21   Burns, Claudina Lick, MD  Multiple Vitamins-Minerals (CENTRUM SILVER PO) Take 1 tablet by mouth daily.    [provider]  omeprazole (PRILOSEC) 20 MG capsule TAKE (1) CAPSULE TWICE DAILY. 05/01/21   Mansouraty, Telford Nab., MD  Polyethyl Glycol-Propyl Glycol 0.4-0.3 % SOLN Place 1 drop into both eyes daily as needed (for dry eyes).     [provider]  potassium chloride SA (KLOR-CON) 20 MEQ tablet TAKE ONE TABLET ONCE DAILY. 03/09/21   Larey Dresser, MD  Probiotic Product (ALIGN) 4 MG CAPS Take by mouth.    [provider]  rosuvastatin (CRESTOR) 40 MG tablet TAKE ONE TABLET ONCE DAILY 03/09/21   Larey Dresser, MD  senna (SENOKOT) 8.6 MG tablet Take 1 tablet by mouth 2 (two) times daily.    [provider]  SYMBICORT 160-4.5 MCG/ACT inhaler USE 2 PUFFS TWICE DAILY. 11/29/20   Binnie Rail, MD    Allergies    Niacin-lovastatin er, Penicillins, Sulfonamide derivatives, and Atorvastatin  Review of Systems   Review of Systems  Constitutional:  Negative for chills and fever.  HENT:  Negative for congestion and rhinorrhea.   Eyes:  Negative for visual disturbance.  Respiratory:  Positive for cough and shortness of breath. Negative for chest tightness.   Cardiovascular:  Negative for chest pain, palpitations and leg swelling.  Gastrointestinal:  Negative for abdominal pain, constipation, diarrhea, nausea and vomiting.  Genitourinary:  Negative for difficulty urinating.   Musculoskeletal:  Negative for back pain.  Skin:  Negative for rash and wound.  Neurological:  Negative for dizziness, syncope, weakness, light-headedness and headaches.  All other systems reviewed and are negative.  Physical Exam Updated Vital Signs BP 130/63 (BP Location: Right Arm)   Pulse (!) 56   Temp 98.1 F (36.7 C) (Oral)   Resp 18   Ht 6\' 1"  (1.854 m)   Wt 87.7 kg   SpO2 93%   BMI 25.52 kg/m   Physical Exam Vitals and nursing note reviewed.  Constitutional:      General: He is not in acute distress.    Appearance: Normal appearance. He is not ill-appearing, toxic-appearing or diaphoretic.  HENT:     Head: Normocephalic and atraumatic.     Nose: Nose normal. No congestion or rhinorrhea.     Mouth/Throat:     Mouth: Mucous membranes are moist.     Pharynx: Oropharynx is clear. No oropharyngeal exudate or posterior oropharyngeal erythema.  Eyes:     General: No scleral icterus.  Right eye: No discharge.        Left eye: No discharge.     Conjunctiva/sclera: Conjunctivae normal.     Pupils: Pupils are equal, round, and reactive to light.  Cardiovascular:     Rate and Rhythm: Normal rate and regular rhythm.     Pulses: Normal pulses.     Heart sounds: Normal heart sounds, S1 normal and S2 normal. No murmur heard.   No friction rub. No gallop.  Pulmonary:     Effort: Pulmonary effort is normal. No respiratory distress.     Breath sounds: Normal breath sounds. No wheezing, rhonchi or rales.  Abdominal:     General: Abdomen is flat. Bowel sounds are normal. There is no distension.     Palpations: Abdomen is soft. There is no pulsatile mass.     Tenderness: There is no abdominal tenderness. There is no guarding or rebound.  Musculoskeletal:     Right lower leg: No edema.     Left lower leg: No edema.  Skin:    General: Skin is warm and dry.     Coloration: Skin is not jaundiced.     Findings: No bruising, erythema, lesion or rash.  Neurological:      General: No focal deficit present.     Mental Status: He is alert and oriented to person, place, and time.  Psychiatric:        Mood and Affect: Mood normal.        Behavior: Behavior normal.    ED Results / Procedures / Treatments   Labs (all labs ordered are listed, but only abnormal results are displayed) Labs Reviewed - No data to display  EKG None  Radiology No results found.  Procedures Procedures   Medications Ordered in ED Medications - No data to display   ED Course  I have reviewed the triage vital signs and the nursing notes.  Pertinent labs & imaging results that were available during my care of the patient were reviewed by me and considered in my medical decision making (see chart for details).    MDM Rules/Calculators/A&P                         This is a well-appearing 80 year old male who presents to the emergency department after having a positive home COVID test.  His prior PCP recommended that he come here to be evaluated given that he has had some shortness of breath prior to his testing positive.  Patient is afebrile.  He is oxygenating at 95% on room air.  Vital signs are hemodynamically stable.  Exam with no adventitious  lung sounds.  He does not appear to be volume overloaded.  Patient feels fine and comfortable with being discharged home.  He would like the antiviral medication.  Given the patient is only 2 days out of symptom onset and he has multiple comorbidities, I do feel that he is a good candidate for antiviral treatment for COVID-19.  I discussed this with the pharmacist who feels like he would not be a good candidate for Paxlovid given that he has multiple medications that would interact. Will send him home with a prescription for Molnupiravir.  And return precautions if he develops worsening shortness of breath or chest pain.  Final Clinical Impression(s) / ED Diagnoses Final diagnoses:  [id number]    Rx / DC Orders ED Discharge Orders           Ordered  molnupiravir EUA (LAGEVRIO) 200 mg CAPS capsule  2 times daily        06/26/21 1642             Ilee Randleman, Cherlyn Roberts 06/26/21 2211    Truddie Hidden, MD 06/26/21 2324

## 2021-06-27 ENCOUNTER — Other Ambulatory Visit (HOSPITAL_COMMUNITY): Payer: Self-pay | Admitting: Cardiology

## 2021-06-27 DIAGNOSIS — R0602 Shortness of breath: Secondary | ICD-10-CM

## 2021-06-27 DIAGNOSIS — I509 Heart failure, unspecified: Secondary | ICD-10-CM

## 2021-06-28 ENCOUNTER — Telehealth: Payer: Self-pay | Admitting: Pharmacist

## 2021-06-28 NOTE — Progress Notes (Signed)
Chronic Care Management Pharmacy Assistant   Name: Troy Thede [name] MRN: [id number] DOB: 1941/01/30  Reason for Encounter: Disease State - Hypertension  Recent office visits:  None noted  Recent consult visits:  06/26/21 Karle Starch (ED) - [facility]. Covid-19. Start Molnupiravir 800 mg.  06/08/21 Gherghe (Endo) - Amiodarone-induced thyrotoxicosis. No med changes.  05/25/21 Icard (Pulmonary) - Lung nodule. No med changes.  Hospital visits:  None in previous 6 months  Medications: Outpatient Encounter Medications as of 06/28/2021  Medication Sig   amLODipine (NORVASC) 5 MG tablet TAKE ONE TABLET ONCE DAILY,   Brinzolamide-Brimonidine (SIMBRINZA) 1-0.2 % SUSP Place 1 drop into both eyes 2 times daily.   clindamycin (CLEOCIN) 300 MG capsule Take 300 mg by mouth 3 (three) times daily.   docusate sodium (COLACE) 100 MG capsule Take 100 mg by mouth 2 (two) times daily.    ELIQUIS 5 MG TABS tablet TAKE ONE TABLET BY MOUTH TWICE DAILY.   empagliflozin (JARDIANCE) 10 MG TABS tablet Take 1 tablet (10 mg total) by mouth daily before breakfast.   escitalopram (LEXAPRO) 10 MG tablet TAKE ONE TABLET ONCE DAILY   famotidine (PEPCID) 20 MG tablet Take 20 mg by mouth daily as needed for heartburn.   furosemide (LASIX) 20 MG tablet Take 3 tablets (60 mg total) by mouth 2 (two) times daily.   latanoprost (XALATAN) 0.005 % ophthalmic solution    losartan (COZAAR) 25 MG tablet Take 1 tablet (25 mg total) by mouth daily.   Melatonin 10 MG CAPS Take 10 mg by mouth at bedtime.   metoprolol succinate (TOPROL-XL) 25 MG 24 hr tablet TAKE 1/2 TABLET IN THE EVENING WITH OR IMMEDIATELY FOLLOWING A MEAL   molnupiravir EUA (LAGEVRIO) 200 mg CAPS capsule Take 4 capsules (800 mg total) by mouth 2 (two) times daily for 5 days.   montelukast (SINGULAIR) 10 MG tablet TAKE ONE TABLET AT BEDTIME.   Multiple Vitamins-Minerals (CENTRUM SILVER PO) Take 1 tablet by mouth daily.   omeprazole (PRILOSEC) 20 MG  capsule TAKE (1) CAPSULE TWICE DAILY.   Polyethyl Glycol-Propyl Glycol 0.4-0.3 % SOLN Place 1 drop into both eyes daily as needed (for dry eyes).    potassium chloride SA (KLOR-CON) 20 MEQ tablet TAKE ONE TABLET ONCE DAILY.   Probiotic Product (ALIGN) 4 MG CAPS Take by mouth.   rosuvastatin (CRESTOR) 40 MG tablet TAKE ONE TABLET ONCE DAILY   senna (SENOKOT) 8.6 MG tablet Take 1 tablet by mouth 2 (two) times daily.   SYMBICORT 160-4.5 MCG/ACT inhaler USE 2 PUFFS TWICE DAILY.   No facility-administered encounter medications on file as of 06/28/2021.    Recent Office Vitals: BP Readings from Last 3 Encounters:  06/26/21 130/63  06/08/21 140/68  05/25/21 126/60   Pulse Readings from Last 3 Encounters:  06/26/21 (!) 56  06/08/21 (!) 57  05/25/21 (!) 59    Wt Readings from Last 3 Encounters:  06/26/21 193 lb 6.4 oz (87.7 kg)  06/08/21 203 lb (92.1 kg)  05/25/21 200 lb 6.4 oz (90.9 kg)     Kidney Function Lab Results  Component Value Date/Time   CREATININE 1.72 (H) 04/19/2021 05:49 PM   CREATININE 1.61 (H) 03/10/2021 03:31 PM   CREATININE 1.38 (H) 02/09/2021 11:36 AM   CREATININE 1.28 (H) 01/14/2020 10:01 AM   CREATININE 1.62 (H) 09/20/2016 11:41 AM   CREATININE 1.49 (H) 09/13/2016 05:00 PM   CREATININE 1.2 03/07/2016 12:13 PM   GFR 54.19 (L) 11/30/2019 03:16 PM   GFRNONAA  40 (L) 04/19/2021 05:49 PM   GFRNONAA 52 (L) 02/09/2021 11:36 AM   GFRAA 74 05/16/2020 04:15 PM   GFRAA >60 01/14/2020 10:01 AM    BMP Latest Ref Rng & Units 04/19/2021 03/10/2021 02/09/2021  Glucose 70 - 99 mg/dL 103(H) 111(H) 170(H)  BUN 8 - 23 mg/dL 19 23 17   Creatinine 0.61 - 1.24 mg/dL 1.72(H) 1.61(H) 1.38(H)  BUN/Creat Ratio 10 - 24 - - -  Sodium 135 - 145 mmol/L 136 139 140  Potassium 3.5 - 5.1 mmol/L 4.0 3.9 4.2  Chloride 98 - 111 mmol/L 100 103 104  CO2 22 - 32 mmol/L 26 28 27   Calcium 8.9 - 10.3 mg/dL 8.9 8.9 9.2     Contacted patient on 06/29/21 to discuss hypertension disease  state  Current antihypertensive regimen:  Amlodipine 5 mg daily Furosemide 20 mg - 2 tab BID Losartan 25 mg daily Metoprolol succinate 25 mg - 1/2 tab daily Jardiance 10 mg daily Klor-Con 20 mEq daily  Current home BP readings:  06/26/21 124/62 06/30/21 200/100  Reviewed chart for medication changes and drug therapy problems ahead of medication adherence call.  Attempted to contact patient x 3 for medication review and health check, unable to reach patient, left voicemails to return call.  Patient was admitted to hospital on 07/01/2021 and is still admitted at the present time.  Star Rating Drugs:  Medication:  Last Fill: Day Supply Jardiance   06/09/21  30 Losartan  06/09/21  28 Rosuvastatin   06/09/21  28  Care Gaps: Annual wellness visit in last year? No Most Recent BP reading: 130/63  If Diabetic: Most recent A1C reading: 6.3 - 12/03/18 Last eye exam / retinopathy screening: Last diabetic foot exam:  PCP appointment on 08/02/21 .  Orinda Kenner, [name] Clinical Pharmacists Assistant [phone]

## 2021-06-30 ENCOUNTER — Emergency Department (HOSPITAL_COMMUNITY): Payer: PPO

## 2021-06-30 ENCOUNTER — Emergency Department (HOSPITAL_COMMUNITY)
Admission: EM | Admit: 2021-06-30 | Discharge: 2021-06-30 | Disposition: A | Discharge status: Home / Self Care | Payer: PPO | Source: Home / Self Care | Attending: Emergency Medicine | Admitting: Emergency Medicine

## 2021-06-30 DIAGNOSIS — J439 Emphysema, unspecified: Secondary | ICD-10-CM | POA: Diagnosis present

## 2021-06-30 DIAGNOSIS — I130 Hypertensive heart and chronic kidney disease with heart failure and stage 1 through stage 4 chronic kidney disease, or unspecified chronic kidney disease: Secondary | ICD-10-CM | POA: Diagnosis present

## 2021-06-30 DIAGNOSIS — Z888 Allergy status to other drugs, medicaments and biological substances status: Secondary | ICD-10-CM | POA: Diagnosis not present

## 2021-06-30 DIAGNOSIS — Z85118 Personal history of other malignant neoplasm of bronchus and lung: Secondary | ICD-10-CM | POA: Diagnosis not present

## 2021-06-30 DIAGNOSIS — I248 Other forms of acute ischemic heart disease: Secondary | ICD-10-CM | POA: Diagnosis present

## 2021-06-30 DIAGNOSIS — I959 Hypotension, unspecified: Secondary | ICD-10-CM | POA: Diagnosis not present

## 2021-06-30 DIAGNOSIS — R5381 Other malaise: Secondary | ICD-10-CM | POA: Diagnosis present

## 2021-06-30 DIAGNOSIS — E8809 Other disorders of plasma-protein metabolism, not elsewhere classified: Secondary | ICD-10-CM | POA: Diagnosis present

## 2021-06-30 DIAGNOSIS — I700 Atherosclerosis of aorta: Secondary | ICD-10-CM | POA: Diagnosis present

## 2021-06-30 DIAGNOSIS — R1084 Generalized abdominal pain: Secondary | ICD-10-CM | POA: Diagnosis not present

## 2021-06-30 DIAGNOSIS — Z85038 Personal history of other malignant neoplasm of large intestine: Secondary | ICD-10-CM | POA: Insufficient documentation

## 2021-06-30 DIAGNOSIS — J9601 Acute respiratory failure with hypoxia: Secondary | ICD-10-CM | POA: Diagnosis present

## 2021-06-30 DIAGNOSIS — Z79899 Other long term (current) drug therapy: Secondary | ICD-10-CM | POA: Insufficient documentation

## 2021-06-30 DIAGNOSIS — R109 Unspecified abdominal pain: Secondary | ICD-10-CM | POA: Diagnosis not present

## 2021-06-30 DIAGNOSIS — N183 Chronic kidney disease, stage 3 unspecified: Secondary | ICD-10-CM | POA: Insufficient documentation

## 2021-06-30 DIAGNOSIS — Z87891 Personal history of nicotine dependence: Secondary | ICD-10-CM | POA: Insufficient documentation

## 2021-06-30 DIAGNOSIS — Z7984 Long term (current) use of oral hypoglycemic drugs: Secondary | ICD-10-CM | POA: Insufficient documentation

## 2021-06-30 DIAGNOSIS — Z7951 Long term (current) use of inhaled steroids: Secondary | ICD-10-CM | POA: Insufficient documentation

## 2021-06-30 DIAGNOSIS — I5032 Chronic diastolic (congestive) heart failure: Secondary | ICD-10-CM | POA: Diagnosis present

## 2021-06-30 DIAGNOSIS — J159 Unspecified bacterial pneumonia: Secondary | ICD-10-CM | POA: Diagnosis present

## 2021-06-30 DIAGNOSIS — E876 Hypokalemia: Secondary | ICD-10-CM | POA: Diagnosis present

## 2021-06-30 DIAGNOSIS — R0789 Other chest pain: Secondary | ICD-10-CM | POA: Diagnosis not present

## 2021-06-30 DIAGNOSIS — I739 Peripheral vascular disease, unspecified: Secondary | ICD-10-CM | POA: Diagnosis present

## 2021-06-30 DIAGNOSIS — I2510 Atherosclerotic heart disease of native coronary artery without angina pectoris: Secondary | ICD-10-CM | POA: Insufficient documentation

## 2021-06-30 DIAGNOSIS — U071 COVID-19: Secondary | ICD-10-CM | POA: Diagnosis present

## 2021-06-30 DIAGNOSIS — R1011 Right upper quadrant pain: Secondary | ICD-10-CM | POA: Diagnosis not present

## 2021-06-30 DIAGNOSIS — I480 Paroxysmal atrial fibrillation: Secondary | ICD-10-CM | POA: Diagnosis present

## 2021-06-30 DIAGNOSIS — R7989 Other specified abnormal findings of blood chemistry: Secondary | ICD-10-CM | POA: Diagnosis not present

## 2021-06-30 DIAGNOSIS — J441 Chronic obstructive pulmonary disease with (acute) exacerbation: Secondary | ICD-10-CM | POA: Diagnosis not present

## 2021-06-30 DIAGNOSIS — N1831 Chronic kidney disease, stage 3a: Secondary | ICD-10-CM | POA: Diagnosis present

## 2021-06-30 DIAGNOSIS — R0602 Shortness of breath: Secondary | ICD-10-CM | POA: Diagnosis present

## 2021-06-30 DIAGNOSIS — R079 Chest pain, unspecified: Secondary | ICD-10-CM | POA: Diagnosis not present

## 2021-06-30 DIAGNOSIS — E119 Type 2 diabetes mellitus without complications: Secondary | ICD-10-CM | POA: Insufficient documentation

## 2021-06-30 DIAGNOSIS — I714 Abdominal aortic aneurysm, without rupture, unspecified: Secondary | ICD-10-CM | POA: Diagnosis present

## 2021-06-30 DIAGNOSIS — Z880 Allergy status to penicillin: Secondary | ICD-10-CM | POA: Diagnosis not present

## 2021-06-30 DIAGNOSIS — N400 Enlarged prostate without lower urinary tract symptoms: Secondary | ICD-10-CM | POA: Diagnosis present

## 2021-06-30 DIAGNOSIS — K8020 Calculus of gallbladder without cholecystitis without obstruction: Secondary | ICD-10-CM | POA: Diagnosis not present

## 2021-06-30 DIAGNOSIS — E785 Hyperlipidemia, unspecified: Secondary | ICD-10-CM | POA: Diagnosis present

## 2021-06-30 DIAGNOSIS — Z85828 Personal history of other malignant neoplasm of skin: Secondary | ICD-10-CM | POA: Insufficient documentation

## 2021-06-30 DIAGNOSIS — G4733 Obstructive sleep apnea (adult) (pediatric): Secondary | ICD-10-CM | POA: Diagnosis present

## 2021-06-30 DIAGNOSIS — R778 Other specified abnormalities of plasma proteins: Secondary | ICD-10-CM | POA: Diagnosis not present

## 2021-06-30 DIAGNOSIS — J9811 Atelectasis: Secondary | ICD-10-CM | POA: Diagnosis not present

## 2021-06-30 DIAGNOSIS — J90 Pleural effusion, not elsewhere classified: Secondary | ICD-10-CM | POA: Diagnosis not present

## 2021-06-30 DIAGNOSIS — R0902 Hypoxemia: Secondary | ICD-10-CM | POA: Diagnosis not present

## 2021-06-30 DIAGNOSIS — R091 Pleurisy: Secondary | ICD-10-CM | POA: Diagnosis not present

## 2021-06-30 DIAGNOSIS — N179 Acute kidney failure, unspecified: Secondary | ICD-10-CM | POA: Diagnosis present

## 2021-06-30 DIAGNOSIS — I10 Essential (primary) hypertension: Secondary | ICD-10-CM | POA: Diagnosis not present

## 2021-06-30 LAB — URINALYSIS, ROUTINE W REFLEX MICROSCOPIC
Bacteria, UA: NONE SEEN
Bilirubin Urine: NEGATIVE
Glucose, UA: >=500 mg/dL — AB
Hgb urine dipstick: NEGATIVE
Ketones, ur: 5 mg/dL — AB
Leukocytes,Ua: NEGATIVE
Nitrite: NEGATIVE
Protein, ur: NEGATIVE mg/dL
Specific Gravity, Urine: 1.016 (ref 1.005–1.030)
pH: 7 (ref 5.0–8.0)

## 2021-06-30 LAB — CBC WITH DIFFERENTIAL/PLATELET
Abs Immature Granulocytes: 0.02 10*3/uL (ref 0.00–0.07)
Basophils Absolute: 0 10*3/uL (ref 0.0–0.1)
Basophils Relative: 0 %
Eosinophils Absolute: 0 10*3/uL (ref 0.0–0.5)
Eosinophils Relative: 1 %
HCT: 41.7 % (ref 39.0–52.0)
Hemoglobin: 13.8 g/dL (ref 13.0–17.0)
Immature Granulocytes: 0 %
Lymphocytes Relative: 7 %
Lymphs Abs: 0.4 10*3/uL — ABNORMAL LOW (ref 0.7–4.0)
MCH: 31 pg (ref 26.0–34.0)
MCHC: 33.1 g/dL (ref 30.0–36.0)
MCV: 93.7 fL (ref 80.0–100.0)
Monocytes Absolute: 0.2 10*3/uL (ref 0.1–1.0)
Monocytes Relative: 3 %
Neutro Abs: 5.4 10*3/uL (ref 1.7–7.7)
Neutrophils Relative %: 89 %
Platelets: 234 10*3/uL (ref 150–400)
RBC: 4.45 MIL/uL (ref 4.22–5.81)
RDW: 14.2 % (ref 11.5–15.5)
WBC: 6.1 10*3/uL (ref 4.0–10.5)
nRBC: 0 % (ref 0.0–0.2)

## 2021-06-30 LAB — COMPREHENSIVE METABOLIC PANEL
ALT: 13 U/L (ref 0–44)
AST: 15 U/L (ref 15–41)
Albumin: 3.5 g/dL (ref 3.5–5.0)
Alkaline Phosphatase: 67 U/L (ref 38–126)
Anion gap: 9 (ref 5–15)
BUN: 23 mg/dL (ref 8–23)
CO2: 23 mmol/L (ref 22–32)
Calcium: 8.7 mg/dL — ABNORMAL LOW (ref 8.9–10.3)
Chloride: 105 mmol/L (ref 98–111)
Creatinine, Ser: 1.39 mg/dL — ABNORMAL HIGH (ref 0.61–1.24)
GFR, Estimated: 51 mL/min — ABNORMAL LOW (ref >60)
Glucose, Bld: 151 mg/dL — ABNORMAL HIGH (ref 70–99)
Potassium: 3.7 mmol/L (ref 3.5–5.1)
Sodium: 137 mmol/L (ref 135–145)
Total Bilirubin: 0.8 mg/dL (ref 0.3–1.2)
Total Protein: 6.9 g/dL (ref 6.5–8.1)

## 2021-06-30 LAB — TROPONIN I (HIGH SENSITIVITY)
Troponin I (High Sensitivity): 9 ng/L (ref <18)
Troponin I (High Sensitivity): 10 ng/L (ref <18)

## 2021-06-30 LAB — RESP PANEL BY RT-PCR (FLU A&B, COVID) ARPGX2
Influenza A by PCR: NEGATIVE
Influenza B by PCR: NEGATIVE
SARS Coronavirus 2 by RT PCR: POSITIVE — AB

## 2021-06-30 LAB — LIPASE, BLOOD: Lipase: 26 U/L (ref 11–51)

## 2021-06-30 MED ORDER — HYDROMORPHONE HCL 1 MG/ML IJ SOLN
0.5000 mg | Freq: Once | INTRAMUSCULAR | Status: AC
  Administered 2021-06-30: 0.5 mg via INTRAVENOUS
  Filled 2021-06-30: qty 1

## 2021-06-30 MED ORDER — ONDANSETRON HCL 4 MG/2ML IJ SOLN
4.0000 mg | Freq: Once | INTRAMUSCULAR | Status: AC | PRN
  Administered 2021-06-30: 4 mg via INTRAVENOUS
  Filled 2021-06-30: qty 2

## 2021-06-30 MED ORDER — SODIUM CHLORIDE 0.9 % IV BOLUS
500.0000 mL | Freq: Once | INTRAVENOUS | Status: AC
  Administered 2021-06-30: 500 mL via INTRAVENOUS

## 2021-06-30 MED ORDER — SODIUM CHLORIDE (PF) 0.9 % IJ SOLN
INTRAMUSCULAR | Status: AC
  Filled 2021-06-30: qty 50

## 2021-06-30 MED ORDER — IOHEXOL 350 MG/ML SOLN
100.0000 mL | Freq: Once | INTRAVENOUS | Status: AC | PRN
  Administered 2021-06-30: 80 mL via INTRAVENOUS

## 2021-06-30 NOTE — ED Triage Notes (Addendum)
Patient is from home complaining of nausea and right sided abdominal pain 8/10. Patient received 16mcg of fentanyl. Patient is COVID +. Received the [employer] vaccine. Patient has history of Afib and hypertension. Patient takes blood pressure medications and eliquis.

## 2021-06-30 NOTE — ED Provider Notes (Signed)
Patient was signed to me by Dr. Verta Ellen pending results of CT angio chest and abdomen.  Patient's AAA is stable.  He has known gallstones which were also seen.  Has some increased paratracheal adenopathy and patient will follow-up with his doctor for this.  His pain is controlled this time and will discharge   Lacretia Leigh, MD 06/30/21 1012

## 2021-06-30 NOTE — ED Provider Notes (Signed)
[facility] DEPT Provider Note   CSN: [id number] Arrival date & time: 06/30/21  0357     History Chief Complaint  Patient presents with   Abdominal Pain   Covid Positive    Troy Quale [name] is a 80 y.o. male.  HPI 80 year old male presents with upper abdominal pain.  Started all of a sudden around midnight.  Previous to this he has been feeling okay.  He had minimal COVID symptoms and had a home test earlier this week.  He reports no fevers.  Some mild dyspnea that did get a little worse with all of this.  The pain is primarily in his upper abdomen but seems to go down throughout the rest of his abdomen and into his chest.  It is a pressure-like sensation.  No vomiting but he felt like he needed to.  No diarrhea or urinary symptoms.  No back pain or fevers.  Pain is rated as severe.  Fentanyl given by EMS seem to transiently help a little bit.  Past Medical History:  Diagnosis Date   AAA (abdominal aortic aneurysm) ([place]) LAST ABDOMINAL US 10-20-17 3.3 CM   MONITORED BY DR [name]   Allergy    Anxiety    Arthritis    Atrial fibrillation (HCC)    Basal cell carcinoma    Bilateral carotid artery stenosis DUPLEX 12-29-2012  BY DR [facility]   BILATERAL ICA STENOSIS 60-79%   BPH (benign prostatic hypertrophy)    Cataract    removed both eyes   CHF (congestive heart failure) (HCC)    Chronic diastolic heart failure (HCC)    Chronic kidney disease    stage 3, pt unaware   Colon cancer ([place])    Complication of anesthesia    had nausea after carotid artery surgery, states the medicine given to help the nausea, made it worse   Constipation    COPD (chronic obstructive pulmonary disease) (HCC)    Depression    Emphysema of lung (HCC)    GERD (gastroesophageal reflux disease)    Glaucoma BOTH EYES   Dr Gershon Crane   History of basal cell carcinoma excision    History of lung cancer APRIL 2012  SQUAMOUS CELL---- S/P RIGHT LOWER LOBECTOMY AT [facility] --  NO  CHEMORADIATION---  NO RECURRENCE    ONCOLOGIST- DR Tressie Stalker  LOV IN [facility] 10-27-2012   History of pneumothorax    pt unaware   Hx of adenomatous colonic polyps 2005    X 2; 1 hyperplastic polyp; Dr Olevia Perches   Hyperlipidemia    Hypertension    Impaired fasting glucose 2007   108; A1c5.4%   Lesion of bladder    Lung cancer ([place]) 01/25/2012   Microhematuria    OSA (obstructive sleep apnea) 08/29/2015   CPAP SET ON 10   PAD (peripheral artery disease) ([place]) ABI'S  JAN 2014  0.65 ON RIGHT ;  1.04 ON LEFT   Peripheral vascular disease (HCC) S/P ANGIOPLASTY AND STENTING   FOLLOWED  BY DR [name]   Pleural effusion on right 11/18/2018   Chronic, noted on CXR   Sleep apnea    + cpap    Spinal stenosis Sept. 2015   Status post placement of implantable loop recorder    Stroke [facility]) Jul 07, 2013   mini  TIA   Thoracic aorta atherosclerosis (HCC)    Thyrotoxicosis    amiodarone induced   Urgency of urination     Patient Active Problem List   Diagnosis  Date Noted   Chronic anticoagulation 10/18/2020   Hx of adenomatous colonic polyps 10/18/2020   History of gastric polyps 10/18/2020   Iron deficiency 10/18/2020   History of anemia 10/18/2020   Gastric intestinal metaplasia 10/18/2020   Aortic atherosclerosis ([place]) 08/01/2020   Secondary hypercoagulable state ([place]) 03/22/2020   Personal history of colon cancer 01/08/2020   Epigastric pain 01/08/2020   Abnormal findings on esophagogastroduodenoscopy (EGD) 01/08/2020   Calculus of gallbladder without cholecystitis without obstruction 01/08/2020   Vertigo 11/25/2019   Cancer of sigmoid colon ([place]) 12/12/2018   Pleural effusion on right 07/15/2018   Colon adenocarcinoma ([place]) 06/30/2018   Acute GI bleeding 06/23/2018   Depression 05/06/2018   Syncope 01/29/2018   Chronic diastolic CHF (congestive heart failure) ([place]) 05/04/2017   CAD (coronary artery disease) 05/03/2017   MGUS (monoclonal gammopathy of unknown significance)  03/07/2016   Benign essential HTN 01/19/2016   OSA (obstructive sleep apnea) 08/29/2015   PAF (paroxysmal atrial fibrillation) ([place])    Glaucoma 06/22/2015   DOE (dyspnea on exertion) 02/04/2015   Memory loss, short term 08/03/2014   Occlusion and stenosis of carotid artery without mention of cerebral infarction 07/09/2013   COPD GOLD II  07/09/2013   Chronic renal disease, stage 3, moderately decreased glomerular filtration rate between 30-59 mL/min/1.73 square meter ([place]) 07/06/2013   TIA (transient ischemic attack) 07/06/2013   Basal cell carcinoma of skin 10/10/2012   History of lung cancer 03/11/2012   PAD (peripheral artery disease) ([place]) 12/16/2011   AAA (abdominal aortic aneurysm) ([place]) 09/27/2011   Squamous cell lung cancer ([place]) 11/08/2010   GAIT IMBALANCE 03/02/2009   Prediabetes 03/02/2009   HYPERPLASIA PROSTATE UNS W/O UR OBST & OTH LUTS 01/15/2008   HYPERLIPIDEMIA 09/29/2007   Peripheral vascular disease ([place]) 09/29/2007   LEUKOPLAKIA, VOCAL CORDS 09/29/2007   FEMORAL BRUIT 09/29/2007    Past Surgical History:  Procedure Laterality Date   ANGIO PLASTY     X 4 in legs   AORTOGRAM  07-27-2002   MILD DIFFUSE ILIAC ARTERY OCCLUSIVE DISEASE /  LEFT RENAL ARTERY 20%/ PATENT LEFT FEM-POP GRAFT/ MILD SFA AND POPLITEAL ARTERY OCCLUSIVE DISEASE W/ SEVERE KIDNEY OCCLUSIVE DISEASE   ATRIAL FIBRILLATION ABLATION N/A 04/09/2019   Procedure: ATRIAL FIBRILLATION ABLATION;  Surgeon: Thompson Grayer, MD;  Location: [place] CV LAB;  Service: Cardiovascular;  Laterality: N/A;   ATRIAL FIBRILLATION ABLATION N/A 02/12/2020   Procedure: ATRIAL FIBRILLATION ABLATION;  Surgeon: Thompson Grayer, MD;  Location: [place] CV LAB;  Service: Cardiovascular;  Laterality: N/A;   BASAL CELL CARCINOMA EXCISION     MULTIPLE TIMES--  RIGHT FOREARM, CHEEKS, AND BACK   BIOPSY  06/25/2018   Procedure: BIOPSY;  Surgeon: Irving Copas., MD;  Location: [facility];  Service: Gastroenterology;;    CARDIOVASCULAR STRESS TEST  12-08-2012  DR [facility]   NORMAL LEXISCAN WITH NO EXERCISE NUCLEAR STUDY/ EF 66%/   NO ISCHEMIA/ NO SIGNIFICANT CHANGE FROM PRIOR STUDY   CARDIOVERSION N/A 02/14/2018   Procedure: CARDIOVERSION;  Surgeon: Larey Dresser, MD;  Location: [place];  Service: Cardiovascular;  Laterality: N/A;   CAROTID ANGIOGRAM N/A 07/10/2013   Procedure: CAROTID ANGIOGRAM;  Surgeon: Elam Dutch, MD;  Location: [facility] CATH LAB;  Service: Cardiovascular;  Laterality: N/A;   CAROTID ENDARTERECTOMY Right 07-14-13   cea   CATARACT EXTRACTION W/ INTRAOCULAR LENS  IMPLANT, BILATERAL     colon polyectomy     COLONOSCOPY     COLONOSCOPY WITH PROPOFOL N/A 06/25/2018  Procedure: COLONOSCOPY WITH PROPOFOL;  Surgeon: Mansouraty, Telford Nab., MD;  Location: [place];  Service: Gastroenterology;  Laterality: N/A;   CYSTOSCOPY W/ RETROGRADES Bilateral 01/21/2013   Procedure: CYSTOSCOPY WITH RETROGRADE PYELOGRAM;  Surgeon: Molli Hazard, MD;  Location: [facility];  Service: Urology;  Laterality: Bilateral;   CYSTO, BLADDER BIOPSY, BILATERAL RETROGRADE PYELOGRAM  RAD TECH FROM RADIOLOGY PER JOY   CYSTOSCOPY WITH BIOPSY N/A 01/21/2013   Procedure: CYSTOSCOPY WITH BIOPSY;  Surgeon: Molli Hazard, MD;  Location: [facility];  Service: Urology;  Laterality: N/A;   ENDARTERECTOMY Right 07/14/2013   Procedure: ENDARTERECTOMY CAROTID;  Surgeon: Angelia Mould, MD;  Location: [place];  Service: Vascular;  Laterality: Right;   EP IMPLANTABLE DEVICE N/A 08/12/2015   Procedure: Loop Recorder Insertion;  Surgeon: Thompson Grayer, MD;  Location: [place] CV LAB;  Service: Cardiovascular;  Laterality: N/A;   ESOPHAGOGASTRODUODENOSCOPY (EGD) WITH PROPOFOL N/A 06/25/2018   Procedure: ESOPHAGOGASTRODUODENOSCOPY (EGD) WITH PROPOFOL;  Surgeon: Rush Landmark Telford Nab., MD;  Location: [place];  Service: Gastroenterology;  Laterality: N/A;    ESOPHAGOGASTRODUODENOSCOPY (EGD) WITH PROPOFOL N/A 07/10/2018   Procedure: ESOPHAGOGASTRODUODENOSCOPY (EGD) WITH PROPOFOL;  Surgeon: Milus Banister, MD;  Location: [facility];  Service: Endoscopy;  Laterality: N/A;   EUS N/A 07/10/2018   Procedure: UPPER ENDOSCOPIC ULTRASOUND (EUS) RADIAL;  Surgeon: Milus Banister, MD;  Location: [facility];  Service: Endoscopy;  Laterality: N/A;   EYE SURGERY Right    FEMORAL-POPLITEAL BYPASS GRAFT Left [place]   AND 2001  IN [place]   FEMORAL-POPLITEAL BYPASS GRAFT Left 08/30/2015   Procedure: REVISION OF BYPASS GRAFT Left  FEMORAL-POPLITEAL ARTERY;  Surgeon: Angelia Mould, MD;  Location: [place];  Service: Vascular;  Laterality: Left;   FINE NEEDLE ASPIRATION N/A 07/10/2018   Procedure: FINE NEEDLE ASPIRATION (FNA) LINEAR;  Surgeon: Milus Banister, MD;  Location: [facility];  Service: Endoscopy;  Laterality: N/A;   FLEXIBLE SIGMOIDOSCOPY N/A 12/12/2018   Procedure: FLEXIBLE SIGMOIDOSCOPY;  Surgeon: Ileana Roup, MD;  Location: [facility];  Service: General;  Laterality: N/A;   LARYNGOSCOPY  06-27-2004   BX VOCAL CORD  (LEUKOPLAKIA)  PER PT NO ISSUES SINCE   LEFT HEART CATH AND CORONARY ANGIOGRAPHY N/A 08/20/2017   Procedure: LEFT HEART CATH AND CORONARY ANGIOGRAPHY;  Surgeon: Larey Dresser, MD;  Location: [place] [facility];  Service: Cardiovascular;  Laterality: N/A;   LOOP RECORDER INSERTION N/A 04/09/2019   Procedure: LOOP RECORDER INSERTION;  Surgeon: Thompson Grayer, MD;  Location: [place] [facility];  Service: Cardiovascular;  Laterality: N/A;   LOOP RECORDER REMOVAL N/A 04/09/2019   Procedure: LOOP RECORDER REMOVAL;  Surgeon: Thompson Grayer, MD;  Location: [place] [facility];  Service: Cardiovascular;  Laterality: N/A;   LOWER EXTREMITY ANGIOGRAM Bilateral 08/29/2015   Procedure: Lower Extremity Angiogram;  Surgeon: Angelia Mould, MD;  Location: [place] [facility];  Service: Cardiovascular;  Laterality: Bilateral;    LUNG LOBECTOMY  01/24/2011    RIGHT UPPER LOBE  (SQUAMOUS CELL CARCINOMA) Dr Dorthea [place] , [facility]. No chemotherapyor radiation   PATCH ANGIOPLASTY Right 07/14/2013   Procedure: PATCH ANGIOPLASTY;  Surgeon: Angelia Mould, MD;  Location: [place];  Service: Vascular;  Laterality: Right;   PATCH ANGIOPLASTY Left 08/30/2015   Procedure: VEIN PATCH ANGIOPLASTY OF PROXIMAL Left BYPASS GRAFT;  Surgeon: Angelia Mould, MD;  Location: [place];  Service: Vascular;  Laterality: Left;   PERIPHERAL VASCULAR CATHETERIZATION N/A 08/29/2015  Procedure: Abdominal Aortogram;  Surgeon: Angelia Mould, MD;  Location: [place] [facility];  Service: Cardiovascular;  Laterality: N/A;   POLYPECTOMY  06/25/2018   Procedure: POLYPECTOMY;  Surgeon: Rush Landmark Telford Nab., MD;  Location: [place];  Service: Gastroenterology;;   POLYPECTOMY     SUBMUCOSAL INJECTION  06/25/2018   Procedure: SUBMUCOSAL INJECTION;  Surgeon: Irving Copas., MD;  Location: [place];  Service: Gastroenterology;;   TEE WITHOUT CARDIOVERSION N/A 02/14/2018   Procedure: TRANSESOPHAGEAL ECHOCARDIOGRAM (TEE);  Surgeon: Larey Dresser, MD;  Location: [facility] ENDOSCOPY;  Service: Cardiovascular;  Laterality: N/A;   trabecular surgery     OS   TRANSTHORACIC ECHOCARDIOGRAM  12-29-2012  DR [facility]   MILD LVH/  LVSF NORMAL/ EF 30-86%/  GRADE I DIASTOLIC DYSFUNCTION       Family History  Problem Relation Age of Onset   Stroke Mother        mini strokes   Alcohol abuse Father    Heart disease Father        MI after 21   Stroke Father    Hypertension Father    Heart attack Father    Heart disease Paternal Aunt        several   Hypertension Paternal Aunt        several   Stroke Paternal Aunt        several   Stroke Paternal Uncle        several   Heart disease Paternal Uncle        several;2 had MI pre 62   Cancer Daughter 27       breast ca, also with benign sessile polyp    Colon cancer Daughter 98   Colon polyps  Neg Hx    Esophageal cancer Neg Hx    Rectal cancer Neg Hx    Stomach cancer Neg Hx     Social History   Tobacco Use   Smoking status: Former    Packs/day: 0.50    Years: 50.00    Pack years: 25.00    Types: Cigarettes    Quit date: 07/28/2014    Years since quitting: 6.9   Smokeless tobacco: Never  Vaping Use   Vaping Use: Never used  Substance Use Topics   Alcohol use: Yes    Alcohol/week: 4.0 standard drinks    Types: 1 Glasses of wine, 1 Cans of beer, 1 Shots of liquor, 1 Standard drinks or equivalent per week    Comment:  socially, variable   Drug use: No    Home Medications Prior to Admission medications   Medication Sig Start Date End Date Taking? Authorizing Provider  amLODipine (NORVASC) 5 MG tablet TAKE ONE TABLET ONCE DAILY, Patient taking differently: Take 5 mg by mouth daily. 06/27/21  Yes Larey Dresser, MD  Brinzolamide-Brimonidine [facility]) 1-0.2 % SUSP Place 1 drop into both eyes 2 times daily. 04/16/20  Yes [provider]  docusate sodium (COLACE) 100 MG capsule Take 100 mg by mouth 2 (two) times daily.    Yes [provider]  ELIQUIS 5 MG TABS tablet TAKE ONE TABLET BY MOUTH TWICE DAILY. Patient taking differently: Take 5 mg by mouth 2 (two) times daily. 06/27/21  Yes Larey Dresser, MD  empagliflozin (JARDIANCE) 10 MG TABS tablet Take 1 tablet (10 mg total) by mouth daily before breakfast. 12/07/20  Yes Larey Dresser, MD  escitalopram (LEXAPRO) 10 MG tablet TAKE ONE TABLET ONCE DAILY Patient taking differently: Take 10 mg  by mouth daily. 04/05/21  Yes Burns, Claudina Lick, MD  famotidine (PEPCID) 20 MG tablet Take 20 mg by mouth daily as needed for heartburn.   Yes [provider]  furosemide (LASIX) 20 MG tablet Take 3 tablets (60 mg total) by mouth 2 (two) times daily. 04/07/21  Yes Larey Dresser, MD  latanoprost (XALATAN) 0.005 % ophthalmic solution Place 1 drop into both eyes at bedtime. 05/16/20  Yes [provider]   losartan (COZAAR) 25 MG tablet Take 1 tablet (25 mg total) by mouth daily. 12/07/20  Yes Larey Dresser, MD  Melatonin 10 MG CAPS Take 10 mg by mouth at bedtime.   Yes [provider]  molnupiravir EUA (LAGEVRIO) 200 mg CAPS capsule Take 4 capsules (800 mg total) by mouth 2 (two) times daily for 5 days. 06/26/21 07/01/21 Yes Loeffler, Adora Fridge, PA-C  montelukast (SINGULAIR) 10 MG tablet TAKE ONE TABLET AT BEDTIME. Patient taking differently: Take 10 mg by mouth at bedtime. 04/05/21  Yes Burns, Claudina Lick, MD  Multiple Vitamins-Minerals (CENTRUM SILVER PO) Take 1 tablet by mouth daily.   Yes [provider]  omeprazole (PRILOSEC) 20 MG capsule TAKE (1) CAPSULE TWICE DAILY. Patient taking differently: Take 20 mg by mouth 2 (two) times daily. 05/01/21  Yes Mansouraty, Telford Nab., MD  Polyethyl Glycol-Propyl Glycol 0.4-0.3 % SOLN Place 1 drop into both eyes daily as needed (for dry eyes).    Yes [provider]  potassium chloride SA (KLOR-CON) 20 MEQ tablet TAKE ONE TABLET ONCE DAILY. Patient taking differently: 20 mEq daily. 06/27/21  Yes Larey Dresser, MD  Probiotic Product (ALIGN) 4 MG CAPS Take 4 mg by mouth daily.   Yes [provider]  rosuvastatin (CRESTOR) 40 MG tablet TAKE ONE TABLET ONCE DAILY Patient taking differently: Take 40 mg by mouth daily. 06/27/21  Yes Larey Dresser, MD  senna (SENOKOT) 8.6 MG tablet Take 1 tablet by mouth 2 (two) times daily.   Yes [provider]  SYMBICORT 160-4.5 MCG/ACT inhaler USE 2 PUFFS TWICE DAILY. Patient taking differently: Inhale 2 puffs into the lungs 2 (two) times daily. 11/29/20  Yes Burns, Claudina Lick, MD  metoprolol succinate (TOPROL-XL) 25 MG 24 hr tablet TAKE 1/2 TABLET IN THE EVENING WITH OR IMMEDIATELY FOLLOWING A MEAL Patient taking differently: Take 12.5 mg by mouth daily. 06/27/21   Larey Dresser, MD    Allergies    Niacin-lovastatin er, Penicillins, Sulfonamide derivatives, and Atorvastatin  Review  of Systems   Review of Systems  Constitutional:  Negative for fever.  Respiratory:  Positive for cough and shortness of breath.   Cardiovascular:  Positive for chest pain.  Gastrointestinal:  Positive for abdominal pain and nausea. Negative for vomiting.  Musculoskeletal:  Negative for back pain.  All other systems reviewed and are negative.  Physical Exam Updated Vital Signs BP (!) 174/69   Pulse 61   Temp 97.7 F (36.5 C) (Oral)   Resp 14   Ht 6\' 1"  (1.854 m)   Wt 88.9 kg   SpO2 94%   BMI 25.86 kg/m   Physical Exam Vitals and nursing note reviewed.  Constitutional:      General: He is not in acute distress.    Appearance: He is well-developed. He is not ill-appearing or diaphoretic.  HENT:     Head: Normocephalic and atraumatic.     Right Ear: External ear normal.     Left Ear: External ear normal.  Nose: Nose normal.  Eyes:     General:        Right eye: No discharge.        Left eye: No discharge.  Cardiovascular:     Rate and Rhythm: Normal rate and regular rhythm.     Heart sounds: Normal heart sounds.  Pulmonary:     Effort: Pulmonary effort is normal.     Breath sounds: Normal breath sounds.  Abdominal:     Palpations: Abdomen is soft.     Tenderness: There is abdominal tenderness in the right upper quadrant and epigastric area. There is no right CVA tenderness or left CVA tenderness.  Musculoskeletal:     Cervical back: Neck supple.  Skin:    General: Skin is warm and dry.  Neurological:     Mental Status: He is alert.  Psychiatric:        Mood and Affect: Mood is not anxious.    ED Results / Procedures / Treatments   Labs (all labs ordered are listed, but only abnormal results are displayed) Labs Reviewed  RESP PANEL BY RT-PCR (FLU A&B, COVID) [id number]  COMPREHENSIVE METABOLIC PANEL  LIPASE, BLOOD  CBC WITH DIFFERENTIAL/PLATELET  URINALYSIS, ROUTINE W REFLEX MICROSCOPIC  TROPONIN I (HIGH SENSITIVITY)    EKG None  Radiology No results  found.  Procedures Procedures   Medications Ordered in ED Medications  sodium chloride 0.9 % bolus 500 mL (has no administration in time range)  HYDROmorphone (DILAUDID) injection 0.5 mg (0.5 mg Intravenous Given 06/30/21 0503)  ondansetron (ZOFRAN) injection 4 mg (4 mg Intravenous Given 06/30/21 0503)    ED Course  I have reviewed the triage vital signs and the nursing notes.  Pertinent labs & imaging results that were available during my care of the patient were reviewed by me and considered in my medical decision making (see chart for details).    MDM Rules/Calculators/A&P                           Unclear what is causing his chest/abdominal pain. RUQ ultrasound overall unremarkable. LFTs benign. Given history of AAA, will get CT dissection study given areas of pain. Will need 2nd troponin. He is otherwise overall feeling better. Care transferred to Dr. Zenia Resides. Final Clinical Impression(s) / ED Diagnoses Final diagnoses:  Right sided abdominal pain    Rx / DC Orders ED Discharge Orders     None        Sherwood Gambler, MD 06/30/21 [phone]

## 2021-06-30 NOTE — Discharge Instructions (Addendum)
You have peritracheal lymphadenopathy which needs to be evaluated by your doctor.  Call your doctor to schedule a follow-up visit

## 2021-06-30 NOTE — ED Notes (Signed)
Raeford Razor, daughter, is waiting in the car. She can be reached at [phone].

## 2021-06-30 NOTE — ED Notes (Signed)
Patient in 2L [place] due to O2 saturation being 87-89%, while laying flat. Patient prefers to lay flat.

## 2021-07-01 ENCOUNTER — Inpatient Hospital Stay (HOSPITAL_COMMUNITY)
Admission: EM | Admit: 2021-07-01 | Discharge: 2021-07-06 | DRG: 190 | Disposition: A | Discharge status: Home Health | Payer: PPO | Attending: Internal Medicine | Admitting: Internal Medicine

## 2021-07-01 ENCOUNTER — Encounter (HOSPITAL_COMMUNITY): Payer: Self-pay | Admitting: Emergency Medicine

## 2021-07-01 ENCOUNTER — Other Ambulatory Visit: Payer: Self-pay

## 2021-07-01 ENCOUNTER — Emergency Department (HOSPITAL_COMMUNITY): Payer: PPO

## 2021-07-01 DIAGNOSIS — I714 Abdominal aortic aneurysm, without rupture, unspecified: Secondary | ICD-10-CM | POA: Diagnosis present

## 2021-07-01 DIAGNOSIS — I2510 Atherosclerotic heart disease of native coronary artery without angina pectoris: Secondary | ICD-10-CM | POA: Diagnosis present

## 2021-07-01 DIAGNOSIS — Z888 Allergy status to other drugs, medicaments and biological substances status: Secondary | ICD-10-CM | POA: Diagnosis not present

## 2021-07-01 DIAGNOSIS — N400 Enlarged prostate without lower urinary tract symptoms: Secondary | ICD-10-CM | POA: Diagnosis present

## 2021-07-01 DIAGNOSIS — J439 Emphysema, unspecified: Principal | ICD-10-CM | POA: Diagnosis present

## 2021-07-01 DIAGNOSIS — E785 Hyperlipidemia, unspecified: Secondary | ICD-10-CM | POA: Diagnosis present

## 2021-07-01 DIAGNOSIS — E876 Hypokalemia: Secondary | ICD-10-CM | POA: Diagnosis present

## 2021-07-01 DIAGNOSIS — I480 Paroxysmal atrial fibrillation: Secondary | ICD-10-CM | POA: Diagnosis present

## 2021-07-01 DIAGNOSIS — Z7901 Long term (current) use of anticoagulants: Secondary | ICD-10-CM

## 2021-07-01 DIAGNOSIS — J9601 Acute respiratory failure with hypoxia: Secondary | ICD-10-CM | POA: Diagnosis present

## 2021-07-01 DIAGNOSIS — Z7951 Long term (current) use of inhaled steroids: Secondary | ICD-10-CM

## 2021-07-01 DIAGNOSIS — I739 Peripheral vascular disease, unspecified: Secondary | ICD-10-CM | POA: Diagnosis present

## 2021-07-01 DIAGNOSIS — Z823 Family history of stroke: Secondary | ICD-10-CM

## 2021-07-01 DIAGNOSIS — I5032 Chronic diastolic (congestive) heart failure: Secondary | ICD-10-CM | POA: Diagnosis present

## 2021-07-01 DIAGNOSIS — J159 Unspecified bacterial pneumonia: Secondary | ICD-10-CM | POA: Diagnosis present

## 2021-07-01 DIAGNOSIS — J441 Chronic obstructive pulmonary disease with (acute) exacerbation: Secondary | ICD-10-CM | POA: Diagnosis not present

## 2021-07-01 DIAGNOSIS — R7989 Other specified abnormal findings of blood chemistry: Secondary | ICD-10-CM | POA: Diagnosis present

## 2021-07-01 DIAGNOSIS — N179 Acute kidney failure, unspecified: Secondary | ICD-10-CM | POA: Diagnosis present

## 2021-07-01 DIAGNOSIS — R5381 Other malaise: Secondary | ICD-10-CM | POA: Diagnosis present

## 2021-07-01 DIAGNOSIS — R0602 Shortness of breath: Secondary | ICD-10-CM | POA: Diagnosis present

## 2021-07-01 DIAGNOSIS — Z9889 Other specified postprocedural states: Secondary | ICD-10-CM

## 2021-07-01 DIAGNOSIS — G4733 Obstructive sleep apnea (adult) (pediatric): Secondary | ICD-10-CM | POA: Diagnosis present

## 2021-07-01 DIAGNOSIS — Z85828 Personal history of other malignant neoplasm of skin: Secondary | ICD-10-CM

## 2021-07-01 DIAGNOSIS — Z9841 Cataract extraction status, right eye: Secondary | ICD-10-CM

## 2021-07-01 DIAGNOSIS — I700 Atherosclerosis of aorta: Secondary | ICD-10-CM | POA: Diagnosis present

## 2021-07-01 DIAGNOSIS — Z85118 Personal history of other malignant neoplasm of bronchus and lung: Secondary | ICD-10-CM

## 2021-07-01 DIAGNOSIS — N1831 Chronic kidney disease, stage 3a: Secondary | ICD-10-CM | POA: Diagnosis present

## 2021-07-01 DIAGNOSIS — Z8249 Family history of ischemic heart disease and other diseases of the circulatory system: Secondary | ICD-10-CM

## 2021-07-01 DIAGNOSIS — Z79899 Other long term (current) drug therapy: Secondary | ICD-10-CM

## 2021-07-01 DIAGNOSIS — Z87891 Personal history of nicotine dependence: Secondary | ICD-10-CM

## 2021-07-01 DIAGNOSIS — Z85038 Personal history of other malignant neoplasm of large intestine: Secondary | ICD-10-CM

## 2021-07-01 DIAGNOSIS — Z811 Family history of alcohol abuse and dependence: Secondary | ICD-10-CM

## 2021-07-01 DIAGNOSIS — Z9842 Cataract extraction status, left eye: Secondary | ICD-10-CM

## 2021-07-01 DIAGNOSIS — U071 COVID-19: Secondary | ICD-10-CM | POA: Diagnosis present

## 2021-07-01 DIAGNOSIS — Z880 Allergy status to penicillin: Secondary | ICD-10-CM | POA: Diagnosis not present

## 2021-07-01 DIAGNOSIS — Z803 Family history of malignant neoplasm of breast: Secondary | ICD-10-CM

## 2021-07-01 DIAGNOSIS — Z86010 Personal history of colonic polyps: Secondary | ICD-10-CM

## 2021-07-01 DIAGNOSIS — R778 Other specified abnormalities of plasma proteins: Secondary | ICD-10-CM | POA: Diagnosis present

## 2021-07-01 DIAGNOSIS — I248 Other forms of acute ischemic heart disease: Secondary | ICD-10-CM | POA: Diagnosis present

## 2021-07-01 DIAGNOSIS — H409 Unspecified glaucoma: Secondary | ICD-10-CM | POA: Diagnosis present

## 2021-07-01 DIAGNOSIS — Z79890 Hormone replacement therapy (postmenopausal): Secondary | ICD-10-CM

## 2021-07-01 DIAGNOSIS — E8809 Other disorders of plasma-protein metabolism, not elsewhere classified: Secondary | ICD-10-CM | POA: Diagnosis present

## 2021-07-01 DIAGNOSIS — Z961 Presence of intraocular lens: Secondary | ICD-10-CM | POA: Diagnosis present

## 2021-07-01 DIAGNOSIS — Z800 Family history of malignant neoplasm of digestive organs: Secondary | ICD-10-CM

## 2021-07-01 DIAGNOSIS — I130 Hypertensive heart and chronic kidney disease with heart failure and stage 1 through stage 4 chronic kidney disease, or unspecified chronic kidney disease: Secondary | ICD-10-CM | POA: Diagnosis present

## 2021-07-01 LAB — TROPONIN I (HIGH SENSITIVITY)
Troponin I (High Sensitivity): 163 ng/L (ref <18)
Troponin I (High Sensitivity): 165 ng/L (ref <18)

## 2021-07-01 LAB — COMPREHENSIVE METABOLIC PANEL
ALT: 20 U/L (ref 0–44)
AST: 27 U/L (ref 15–41)
Albumin: 3 g/dL — ABNORMAL LOW (ref 3.5–5.0)
Alkaline Phosphatase: 147 U/L — ABNORMAL HIGH (ref 38–126)
Anion gap: 14 (ref 5–15)
BUN: 26 mg/dL — ABNORMAL HIGH (ref 8–23)
CO2: 21 mmol/L — ABNORMAL LOW (ref 22–32)
Calcium: 8.6 mg/dL — ABNORMAL LOW (ref 8.9–10.3)
Chloride: 101 mmol/L (ref 98–111)
Creatinine, Ser: 2.12 mg/dL — ABNORMAL HIGH (ref 0.61–1.24)
GFR, Estimated: 31 mL/min — ABNORMAL LOW (ref >60)
Glucose, Bld: 136 mg/dL — ABNORMAL HIGH (ref 70–99)
Potassium: 3.2 mmol/L — ABNORMAL LOW (ref 3.5–5.1)
Sodium: 136 mmol/L (ref 135–145)
Total Bilirubin: 1.8 mg/dL — ABNORMAL HIGH (ref 0.3–1.2)
Total Protein: 6.5 g/dL (ref 6.5–8.1)

## 2021-07-01 LAB — I-STAT VENOUS BLOOD GAS, ED
Acid-base deficit: 1 mmol/L (ref 0.0–2.0)
Bicarbonate: 22.8 mmol/L (ref 20.0–28.0)
Calcium, Ion: 1.08 mmol/L — ABNORMAL LOW (ref 1.15–1.40)
HCT: 37 % — ABNORMAL LOW (ref 39.0–52.0)
Hemoglobin: 12.6 g/dL — ABNORMAL LOW (ref 13.0–17.0)
O2 Saturation: 91 %
Potassium: 3.3 mmol/L — ABNORMAL LOW (ref 3.5–5.1)
Sodium: 138 mmol/L (ref 135–145)
TCO2: 24 mmol/L (ref 22–32)
pCO2, Ven: 34 mmHg — ABNORMAL LOW (ref 44.0–60.0)
pH, Ven: 7.433 — ABNORMAL HIGH (ref 7.250–7.430)
pO2, Ven: 60 mmHg — ABNORMAL HIGH (ref 32.0–45.0)

## 2021-07-01 LAB — CBC WITH DIFFERENTIAL/PLATELET
Abs Immature Granulocytes: 0 10*3/uL (ref 0.00–0.07)
Basophils Absolute: 0 10*3/uL (ref 0.0–0.1)
Basophils Relative: 0 %
Eosinophils Absolute: 0 10*3/uL (ref 0.0–0.5)
Eosinophils Relative: 0 %
HCT: 39.3 % (ref 39.0–52.0)
Hemoglobin: 12.8 g/dL — ABNORMAL LOW (ref 13.0–17.0)
Lymphocytes Relative: 5 %
Lymphs Abs: 0.2 10*3/uL — ABNORMAL LOW (ref 0.7–4.0)
MCH: 30.5 pg (ref 26.0–34.0)
MCHC: 32.6 g/dL (ref 30.0–36.0)
MCV: 93.6 fL (ref 80.0–100.0)
Monocytes Absolute: 0 10*3/uL — ABNORMAL LOW (ref 0.1–1.0)
Monocytes Relative: 0 %
Neutro Abs: 4.7 10*3/uL (ref 1.7–7.7)
Neutrophils Relative %: 95 %
Platelets: 192 10*3/uL (ref 150–400)
RBC: 4.2 MIL/uL — ABNORMAL LOW (ref 4.22–5.81)
RDW: 14.7 % (ref 11.5–15.5)
WBC: 4.9 10*3/uL (ref 4.0–10.5)
nRBC: 0 % (ref 0.0–0.2)
nRBC: 0 /100 WBC

## 2021-07-01 LAB — MAGNESIUM: Magnesium: <0.5 mg/dL — CL (ref 1.7–2.4)

## 2021-07-01 LAB — BRAIN NATRIURETIC PEPTIDE: B Natriuretic Peptide: 380.8 pg/mL — ABNORMAL HIGH (ref 0.0–100.0)

## 2021-07-01 LAB — PROTIME-INR
INR: 1.8 — ABNORMAL HIGH (ref 0.8–1.2)
Prothrombin Time: 21.2 seconds — ABNORMAL HIGH (ref 11.4–15.2)

## 2021-07-01 LAB — FIBRINOGEN: Fibrinogen: >800 mg/dL — ABNORMAL HIGH (ref 210–475)

## 2021-07-01 LAB — LACTATE DEHYDROGENASE: LDH: 185 U/L (ref 98–192)

## 2021-07-01 LAB — D-DIMER, QUANTITATIVE: D-Dimer, Quant: 4.83 ug/mL-FEU — ABNORMAL HIGH (ref 0.00–0.50)

## 2021-07-01 MED ORDER — MONTELUKAST SODIUM 10 MG PO TABS
10.0000 mg | ORAL_TABLET | Freq: Every day | ORAL | Status: DC
  Administered 2021-07-02 – 2021-07-05 (×5): 10 mg via ORAL
  Filled 2021-07-01 (×7): qty 1

## 2021-07-01 MED ORDER — PANTOPRAZOLE SODIUM 40 MG PO TBEC
40.0000 mg | DELAYED_RELEASE_TABLET | Freq: Every day | ORAL | Status: DC
  Administered 2021-07-02 – 2021-07-06 (×5): 40 mg via ORAL
  Filled 2021-07-01 (×5): qty 1

## 2021-07-01 MED ORDER — ACETAMINOPHEN 325 MG PO TABS
650.0000 mg | ORAL_TABLET | Freq: Four times a day (QID) | ORAL | Status: DC | PRN
  Administered 2021-07-02 – 2021-07-05 (×2): 650 mg via ORAL
  Filled 2021-07-01 (×2): qty 2

## 2021-07-01 MED ORDER — IPRATROPIUM-ALBUTEROL 20-100 MCG/ACT IN AERS
1.0000 | INHALATION_SPRAY | Freq: Four times a day (QID) | RESPIRATORY_TRACT | Status: DC
  Administered 2021-07-02 (×2): 1 via RESPIRATORY_TRACT
  Filled 2021-07-01: qty 4

## 2021-07-01 MED ORDER — ALBUTEROL SULFATE HFA 108 (90 BASE) MCG/ACT IN AERS
1.0000 | INHALATION_SPRAY | RESPIRATORY_TRACT | Status: DC | PRN
  Filled 2021-07-01: qty 6.7

## 2021-07-01 MED ORDER — ROSUVASTATIN CALCIUM 20 MG PO TABS
40.0000 mg | ORAL_TABLET | Freq: Every day | ORAL | Status: DC
  Administered 2021-07-02 – 2021-07-06 (×6): 40 mg via ORAL
  Filled 2021-07-01 (×6): qty 2

## 2021-07-01 MED ORDER — BRIMONIDINE TARTRATE 0.2 % OP SOLN
1.0000 [drp] | Freq: Two times a day (BID) | OPHTHALMIC | Status: DC
  Administered 2021-07-02 – 2021-07-05 (×7): 1 [drp] via OPHTHALMIC
  Filled 2021-07-01: qty 5

## 2021-07-01 MED ORDER — LACTATED RINGERS IV SOLN: INTRAVENOUS | Status: DC

## 2021-07-01 MED ORDER — MAGNESIUM SULFATE 2 GM/50ML IV SOLN
2.0000 g | Freq: Once | INTRAVENOUS | Status: AC
  Administered 2021-07-01: 2 g via INTRAVENOUS
  Filled 2021-07-01: qty 50

## 2021-07-01 MED ORDER — LATANOPROST 0.005 % OP SOLN
1.0000 [drp] | Freq: Every day | OPHTHALMIC | Status: DC
  Administered 2021-07-02 – 2021-07-05 (×5): 1 [drp] via OPHTHALMIC
  Filled 2021-07-01: qty 2.5

## 2021-07-01 MED ORDER — ACETAMINOPHEN 650 MG RE SUPP: 650.0000 mg | Freq: Four times a day (QID) | RECTAL | Status: DC | PRN

## 2021-07-01 MED ORDER — BRINZOLAMIDE 1 % OP SUSP
1.0000 [drp] | Freq: Two times a day (BID) | OPHTHALMIC | Status: DC
  Administered 2021-07-03 – 2021-07-05 (×5): 1 [drp] via OPHTHALMIC
  Filled 2021-07-01: qty 10

## 2021-07-01 MED ORDER — APIXABAN 5 MG PO TABS
5.0000 mg | ORAL_TABLET | Freq: Two times a day (BID) | ORAL | Status: DC
  Administered 2021-07-02 – 2021-07-06 (×10): 5 mg via ORAL
  Filled 2021-07-01 (×10): qty 1

## 2021-07-01 MED ORDER — ACETAMINOPHEN 500 MG PO TABS
1000.0000 mg | ORAL_TABLET | Freq: Once | ORAL | Status: AC
  Administered 2021-07-01: 1000 mg via ORAL
  Filled 2021-07-01: qty 2

## 2021-07-01 MED ORDER — ONDANSETRON HCL 4 MG/2ML IJ SOLN: 4.0000 mg | Freq: Four times a day (QID) | INTRAMUSCULAR | Status: DC | PRN

## 2021-07-01 MED ORDER — METHYLPREDNISOLONE SODIUM SUCC 125 MG IJ SOLR
125.0000 mg | Freq: Once | INTRAMUSCULAR | Status: AC
  Administered 2021-07-01: 125 mg via INTRAVENOUS
  Filled 2021-07-01: qty 2

## 2021-07-01 MED ORDER — LACTATED RINGERS IV SOLN: INTRAVENOUS | Status: AC

## 2021-07-01 MED ORDER — METHYLPREDNISOLONE SODIUM SUCC 125 MG IJ SOLR
45.0000 mg | Freq: Two times a day (BID) | INTRAMUSCULAR | Status: DC
  Administered 2021-07-02: 45 mg via INTRAVENOUS
  Filled 2021-07-01: qty 2

## 2021-07-01 MED ORDER — METOPROLOL SUCCINATE ER 25 MG PO TB24
12.5000 mg | ORAL_TABLET | Freq: Every day | ORAL | Status: DC
  Administered 2021-07-02 – 2021-07-06 (×5): 12.5 mg via ORAL
  Filled 2021-07-01 (×5): qty 1

## 2021-07-01 NOTE — ED Notes (Signed)
EDP made aware of troponin 163

## 2021-07-01 NOTE — ED Provider Notes (Signed)
[facility] EMERGENCY DEPARTMENT Provider Note   CSN: [id number] Arrival date & time: 07/01/21  1740     History Chief Complaint  Patient presents with   Shortness of Troy [name] is a 80 y.o. male.  80 y.o male with a PMH of AAA, CHF, COPD, prior lung cancer with a right lobe resection presents to the ED via EMS with a chief complaint of acute onset of shortness of breath today.  Patient tested today for COVID-19 approximately 6 days ago, he is currently taking Molnupiravir, reports on day 5.  He reports worsening symptoms, continues to have shortness of breath at rest and exacerbated with movement.  Is endorsing productive cough, with some sputum.  In addition, daughter Troy [name] at the bedside reports patient has had decrease in p.o. intake, has had nausea and vomiting for the past 2 days.  Patient evaluated at [facility] yesterday, had a negative work-up as they were told.  She states as soon as they were discharged he got in the parking lot, drink some water and began to vomit.  He does not wear any oxygen at baseline, he was brought in by EMS on 5 L of O2 nasal cannula satting at 94%.  No prior history of blood clots, denies any hemoptysis, fever, chest pain, or leg swelling. Of note, patient does report increased urination, does take Lasix daily.    The history is provided by the patient.  Shortness of Breath Severity:  Moderate Onset quality:  Gradual Duration:  1 day Timing:  Constant Progression:  Worsening Chronicity:  New Context: activity   Relieved by:  Nothing Worsened by:  Movement and exertion Ineffective treatments:  None tried Associated symptoms: cough and vomiting   Associated symptoms: no abdominal pain, no chest pain, no fever, no hemoptysis, no sore throat and no wheezing       Past Medical History:  Diagnosis Date   AAA (abdominal aortic aneurysm) LAST ABDOMINAL US 10-20-17 3.3 CM   MONITORED BY DR [name]    Allergy    Anxiety    Arthritis    Atrial fibrillation (HCC)    Basal cell carcinoma    Bilateral carotid artery stenosis DUPLEX 12-29-2012  BY DR [facility]   BILATERAL ICA STENOSIS 60-79%   BPH (benign prostatic hypertrophy)    Cataract    removed both eyes   CHF (congestive heart failure) (HCC)    Chronic diastolic heart failure (HCC)    Chronic kidney disease    stage 3, pt unaware   Colon cancer ([place])    Complication of anesthesia    had nausea after carotid artery surgery, states the medicine given to help the nausea, made it worse   Constipation    COPD (chronic obstructive pulmonary disease) (HCC)    Depression    Emphysema of lung ([place])    GERD (gastroesophageal reflux disease)    Glaucoma BOTH EYES   Dr Gershon Crane   History of basal cell carcinoma excision    History of lung cancer APRIL 2012  SQUAMOUS CELL---- S/P RIGHT LOWER LOBECTOMY AT [facility] --  NO CHEMORADIATION---  NO RECURRENCE    ONCOLOGIST- DR Tressie Stalker  LOV IN [facility] 10-27-2012   History of pneumothorax    pt unaware   Hx of adenomatous colonic polyps 2005    X 2; 1 hyperplastic polyp; Dr Olevia Perches   Hyperlipidemia    Hypertension    Impaired fasting glucose 2007   108; A1c5.4%  Lesion of bladder    Lung cancer ([place]) 01/25/2012   Microhematuria    OSA (obstructive sleep apnea) 08/29/2015   CPAP SET ON 10   PAD (peripheral artery disease) ([place]) ABI'S  JAN 2014  0.65 ON RIGHT ;  1.04 ON LEFT   Peripheral vascular disease (HCC) S/P ANGIOPLASTY AND STENTING   FOLLOWED  BY DR [name]   Pleural effusion on right 11/18/2018   Chronic, noted on CXR   Sleep apnea    + cpap    Spinal stenosis Sept. 2015   Status post placement of implantable loop recorder    Stroke [facility]) Jul 07, 2013   mini  TIA   Thoracic aorta atherosclerosis ([place])    Thyrotoxicosis    amiodarone induced   Urgency of urination     Patient Active Problem List   Diagnosis Date Noted   COVID-19 virus infection 07/01/2021   Chronic  anticoagulation 10/18/2020   Hx of adenomatous colonic polyps 10/18/2020   History of gastric polyps 10/18/2020   Iron deficiency 10/18/2020   History of anemia 10/18/2020   Gastric intestinal metaplasia 10/18/2020   Aortic atherosclerosis ([place]) 08/01/2020   Secondary hypercoagulable state ([place]) 03/22/2020   Personal history of colon cancer 01/08/2020   Epigastric pain 01/08/2020   Abnormal findings on esophagogastroduodenoscopy (EGD) 01/08/2020   Calculus of gallbladder without cholecystitis without obstruction 01/08/2020   Vertigo 11/25/2019   Cancer of sigmoid colon ([place]) 12/12/2018   Pleural effusion on right 07/15/2018   Colon adenocarcinoma ([place]) 06/30/2018   Acute GI bleeding 06/23/2018   Depression 05/06/2018   Syncope 01/29/2018   Chronic diastolic CHF (congestive heart failure) ([place]) 05/04/2017   CAD (coronary artery disease) 05/03/2017   MGUS (monoclonal gammopathy of unknown significance) 03/07/2016   Benign essential HTN 01/19/2016   OSA (obstructive sleep apnea) 08/29/2015   PAF (paroxysmal atrial fibrillation) ([place])    Glaucoma 06/22/2015   DOE (dyspnea on exertion) 02/04/2015   Memory loss, short term 08/03/2014   Occlusion and stenosis of carotid artery without mention of cerebral infarction 07/09/2013   COPD GOLD II  07/09/2013   Chronic renal disease, stage 3, moderately decreased glomerular filtration rate between 30-59 mL/min/1.73 square meter ([place]) 07/06/2013   TIA (transient ischemic attack) 07/06/2013   Basal cell carcinoma of skin 10/10/2012   History of lung cancer 03/11/2012   PAD (peripheral artery disease) ([place]) 12/16/2011   AAA (abdominal aortic aneurysm) ([place]) 09/27/2011   Squamous cell lung cancer ([place]) 11/08/2010   GAIT IMBALANCE 03/02/2009   Prediabetes 03/02/2009   HYPERPLASIA PROSTATE UNS W/O UR OBST & OTH LUTS 01/15/2008   HYPERLIPIDEMIA 09/29/2007   Peripheral vascular disease ([place]) 09/29/2007   LEUKOPLAKIA, VOCAL CORDS 09/29/2007    FEMORAL BRUIT 09/29/2007    Past Surgical History:  Procedure Laterality Date   ANGIO PLASTY     X 4 in legs   AORTOGRAM  07-27-2002   MILD DIFFUSE ILIAC ARTERY OCCLUSIVE DISEASE /  LEFT RENAL ARTERY 20%/ PATENT LEFT FEM-POP GRAFT/ MILD SFA AND POPLITEAL ARTERY OCCLUSIVE DISEASE W/ SEVERE KIDNEY OCCLUSIVE DISEASE   ATRIAL FIBRILLATION ABLATION N/A 04/09/2019   Procedure: ATRIAL FIBRILLATION ABLATION;  Surgeon: Thompson Grayer, MD;  Location: [place] CV LAB;  Service: Cardiovascular;  Laterality: N/A;   ATRIAL FIBRILLATION ABLATION N/A 02/12/2020   Procedure: ATRIAL FIBRILLATION ABLATION;  Surgeon: Thompson Grayer, MD;  Location: [place] CV LAB;  Service: Cardiovascular;  Laterality: N/A;   BASAL CELL CARCINOMA EXCISION     MULTIPLE TIMES--  RIGHT FOREARM, CHEEKS, AND BACK   BIOPSY  06/25/2018   Procedure: BIOPSY;  Surgeon: Irving Copas., MD;  Location: [place];  Service: Gastroenterology;;   CARDIOVASCULAR STRESS TEST  12-08-2012  DR [facility]   NORMAL LEXISCAN WITH NO EXERCISE NUCLEAR STUDY/ EF 66%/   NO ISCHEMIA/ NO SIGNIFICANT CHANGE FROM PRIOR STUDY   CARDIOVERSION N/A 02/14/2018   Procedure: CARDIOVERSION;  Surgeon: Larey Dresser, MD;  Location: [place];  Service: Cardiovascular;  Laterality: N/A;   CAROTID ANGIOGRAM N/A 07/10/2013   Procedure: CAROTID ANGIOGRAM;  Surgeon: Elam Dutch, MD;  Location: [facility] CATH LAB;  Service: Cardiovascular;  Laterality: N/A;   CAROTID ENDARTERECTOMY Right 07-14-13   cea   CATARACT EXTRACTION W/ INTRAOCULAR LENS  IMPLANT, BILATERAL     colon polyectomy     COLONOSCOPY     COLONOSCOPY WITH PROPOFOL N/A 06/25/2018   Procedure: COLONOSCOPY WITH PROPOFOL;  Surgeon: Rush Landmark Telford Nab., MD;  Location: [place];  Service: Gastroenterology;  Laterality: N/A;   CYSTOSCOPY W/ RETROGRADES Bilateral 01/21/2013   Procedure: CYSTOSCOPY WITH RETROGRADE PYELOGRAM;  Surgeon: Molli Hazard, MD;  Location: [facility];  Service: Urology;  Laterality: Bilateral;   CYSTO, BLADDER BIOPSY, BILATERAL RETROGRADE PYELOGRAM  RAD TECH FROM RADIOLOGY PER JOY   CYSTOSCOPY WITH BIOPSY N/A 01/21/2013   Procedure: CYSTOSCOPY WITH BIOPSY;  Surgeon: Molli Hazard, MD;  Location: [facility];  Service: Urology;  Laterality: N/A;   ENDARTERECTOMY Right 07/14/2013   Procedure: ENDARTERECTOMY CAROTID;  Surgeon: Angelia Mould, MD;  Location: [place];  Service: Vascular;  Laterality: Right;   EP IMPLANTABLE DEVICE N/A 08/12/2015   Procedure: Loop Recorder Insertion;  Surgeon: Thompson Grayer, MD;  Location: [place] CV LAB;  Service: Cardiovascular;  Laterality: N/A;   ESOPHAGOGASTRODUODENOSCOPY (EGD) WITH PROPOFOL N/A 06/25/2018   Procedure: ESOPHAGOGASTRODUODENOSCOPY (EGD) WITH PROPOFOL;  Surgeon: Rush Landmark Telford Nab., MD;  Location: [place];  Service: Gastroenterology;  Laterality: N/A;   ESOPHAGOGASTRODUODENOSCOPY (EGD) WITH PROPOFOL N/A 07/10/2018   Procedure: ESOPHAGOGASTRODUODENOSCOPY (EGD) WITH PROPOFOL;  Surgeon: Milus Banister, MD;  Location: [facility] ENDOSCOPY;  Service: Endoscopy;  Laterality: N/A;   EUS N/A 07/10/2018   Procedure: UPPER ENDOSCOPIC ULTRASOUND (EUS) RADIAL;  Surgeon: Milus Banister, MD;  Location: [facility] ENDOSCOPY;  Service: Endoscopy;  Laterality: N/A;   EYE SURGERY Right    FEMORAL-POPLITEAL BYPASS GRAFT Left [place]   AND 2001  IN [place]   FEMORAL-POPLITEAL BYPASS GRAFT Left 08/30/2015   Procedure: REVISION OF BYPASS GRAFT Left  FEMORAL-POPLITEAL ARTERY;  Surgeon: Angelia Mould, MD;  Location: [place];  Service: Vascular;  Laterality: Left;   FINE NEEDLE ASPIRATION N/A 07/10/2018   Procedure: FINE NEEDLE ASPIRATION (FNA) LINEAR;  Surgeon: Milus Banister, MD;  Location: [facility] ENDOS[facility];  Service: Endoscopy;  Laterality: N/A;   FLEXIBLE SIGMOIDOSCOPY N/A 12/12/2018   Procedure: FLEXIBLE SIGMOIDOSCOPY;  Surgeon: Ileana Roup, MD;  Location: [facility]  ORS;  Service: General;  Laterality: N/A;   LARYNGOSCOPY  06-27-2004   BX VOCAL CORD  (LEUKOPLAKIA)  PER PT NO ISSUES SINCE   LEFT HEART CATH AND CORONARY ANGIOGRAPHY N/A 08/20/2017   Procedure: LEFT HEART CATH AND CORONARY ANGIOGRAPHY;  Surgeon: Larey Dresser, MD;  Location: [place] CV LAB;  Service: Cardiovascular;  Laterality: N/A;   LOOP RECORDER INSERTION N/A 04/09/2019   Procedure: LOOP RECORDER INSERTION;  Surgeon: Thompson Grayer, MD;  Location: [place] CV LAB;  Service: Cardiovascular;  Laterality: N/A;  LOOP RECORDER REMOVAL N/A 04/09/2019   Procedure: LOOP RECORDER REMOVAL;  Surgeon: Thompson Grayer, MD;  Location: [place] CV LAB;  Service: Cardiovascular;  Laterality: N/A;   LOWER EXTREMITY ANGIOGRAM Bilateral 08/29/2015   Procedure: Lower Extremity Angiogram;  Surgeon: Angelia Mould, MD;  Location: [place] CV LAB;  Service: Cardiovascular;  Laterality: Bilateral;   LUNG LOBECTOMY  01/24/2011    RIGHT UPPER LOBE  (SQUAMOUS CELL CARCINOMA) Dr Dorthea [place] , [facility]. No chemotherapyor radiation   PATCH ANGIOPLASTY Right 07/14/2013   Procedure: PATCH ANGIOPLASTY;  Surgeon: Angelia Mould, MD;  Location: [place];  Service: Vascular;  Laterality: Right;   PATCH ANGIOPLASTY Left 08/30/2015   Procedure: VEIN PATCH ANGIOPLASTY OF PROXIMAL Left BYPASS GRAFT;  Surgeon: Angelia Mould, MD;  Location: [place];  Service: Vascular;  Laterality: Left;   PERIPHERAL VASCULAR CATHETERIZATION N/A 08/29/2015   Procedure: Abdominal Aortogram;  Surgeon: Angelia Mould, MD;  Location: [place] CV LAB;  Service: Cardiovascular;  Laterality: N/A;   POLYPECTOMY  06/25/2018   Procedure: POLYPECTOMY;  Surgeon: Rush Landmark Telford Nab., MD;  Location: [place];  Service: Gastroenterology;;   POLYPECTOMY     SUBMUCOSAL INJECTION  06/25/2018   Procedure: SUBMUCOSAL INJECTION;  Surgeon: Irving Copas., MD;  Location: [place];  Service: Gastroenterology;;   TEE  WITHOUT CARDIOVERSION N/A 02/14/2018   Procedure: TRANSESOPHAGEAL ECHOCARDIOGRAM (TEE);  Surgeon: Larey Dresser, MD;  Location: [facility] ENDOSCOPY;  Service: Cardiovascular;  Laterality: N/A;   trabecular surgery     OS   TRANSTHORACIC ECHOCARDIOGRAM  12-29-2012  DR [facility]   MILD LVH/  LVSF NORMAL/ EF 41-74%/  GRADE I DIASTOLIC DYSFUNCTION       Family History  Problem Relation Age of Onset   Stroke Mother        mini strokes   Alcohol abuse Father    Heart disease Father        MI after 32   Stroke Father    Hypertension Father    Heart attack Father    Heart disease Paternal Aunt        several   Hypertension Paternal Aunt        several   Stroke Paternal Aunt        several   Stroke Paternal Uncle        several   Heart disease Paternal Uncle        several;2 had MI pre 91   Cancer Daughter 8       breast ca, also with benign sessile polyp    Colon cancer Daughter 14   Colon polyps Neg Hx    Esophageal cancer Neg Hx    Rectal cancer Neg Hx    Stomach cancer Neg Hx     Social History   Tobacco Use   Smoking status: Former    Packs/day: 0.50    Years: 50.00    Pack years: 25.00    Types: Cigarettes    Quit date: 07/28/2014    Years since quitting: 6.9   Smokeless tobacco: Never  Vaping Use   Vaping Use: Never used  Substance Use Topics   Alcohol use: Yes    Alcohol/week: 4.0 standard drinks    Types: 1 Glasses of wine, 1 Cans of beer, 1 Shots of liquor, 1 Standard drinks or equivalent per week    Comment:  socially, variable   Drug use: No    Home Medications Prior to Admission medications   Medication Sig Start  Date End Date Taking? Authorizing Provider  amLODipine (NORVASC) 5 MG tablet TAKE ONE TABLET ONCE DAILY, Patient taking differently: Take 5 mg by mouth daily. 06/27/21   Larey Dresser, MD  Brinzolamide-Brimonidine [facility]) 1-0.2 % SUSP Place 1 drop into both eyes 2 times daily. 04/16/20   [provider]  docusate sodium (COLACE)  100 MG capsule Take 100 mg by mouth 2 (two) times daily.     [provider]  ELIQUIS 5 MG TABS tablet TAKE ONE TABLET BY MOUTH TWICE DAILY. Patient taking differently: Take 5 mg by mouth 2 (two) times daily. 06/27/21   Larey Dresser, MD  empagliflozin (JARDIANCE) 10 MG TABS tablet Take 1 tablet (10 mg total) by mouth daily before breakfast. 12/07/20   Larey Dresser, MD  escitalopram (LEXAPRO) 10 MG tablet TAKE ONE TABLET ONCE DAILY Patient taking differently: Take 10 mg by mouth daily. 04/05/21   Binnie Rail, MD  famotidine (PEPCID) 20 MG tablet Take 20 mg by mouth daily as needed for heartburn.    [provider]  furosemide (LASIX) 20 MG tablet Take 3 tablets (60 mg total) by mouth 2 (two) times daily. 04/07/21   Larey Dresser, MD  latanoprost (XALATAN) 0.005 % ophthalmic solution Place 1 drop into both eyes at bedtime. 05/16/20   [provider]  losartan (COZAAR) 25 MG tablet Take 1 tablet (25 mg total) by mouth daily. 12/07/20   Larey Dresser, MD  Melatonin 10 MG CAPS Take 10 mg by mouth at bedtime.    [provider]  metoprolol succinate (TOPROL-XL) 25 MG 24 hr tablet TAKE 1/2 TABLET IN THE EVENING WITH OR IMMEDIATELY FOLLOWING A MEAL Patient taking differently: Take 12.5 mg by mouth daily. 06/27/21   Larey Dresser, MD  molnupiravir EUA (LAGEVRIO) 200 mg CAPS capsule Take 4 capsules (800 mg total) by mouth 2 (two) times daily for 5 days. 06/26/21 07/01/21  Loeffler, Adora Fridge, PA-C  montelukast (SINGULAIR) 10 MG tablet TAKE ONE TABLET AT BEDTIME. Patient taking differently: Take 10 mg by mouth at bedtime. 04/05/21   Binnie Rail, MD  Multiple Vitamins-Minerals (CENTRUM SILVER PO) Take 1 tablet by mouth daily.    [provider]  omeprazole (PRILOSEC) 20 MG capsule TAKE (1) CAPSULE TWICE DAILY. Patient taking differently: Take 20 mg by mouth 2 (two) times daily. 05/01/21   Mansouraty, Telford Nab., MD  Polyethyl Glycol-Propyl Glycol 0.4-0.3 %  SOLN Place 1 drop into both eyes daily as needed (for dry eyes).     [provider]  potassium chloride SA (KLOR-CON) 20 MEQ tablet TAKE ONE TABLET ONCE DAILY. Patient taking differently: 20 mEq daily. 06/27/21   Larey Dresser, MD  Probiotic Product (ALIGN) 4 MG CAPS Take 4 mg by mouth daily.    [provider]  rosuvastatin (CRESTOR) 40 MG tablet TAKE ONE TABLET ONCE DAILY Patient taking differently: Take 40 mg by mouth daily. 06/27/21   Larey Dresser, MD  senna (SENOKOT) 8.6 MG tablet Take 1 tablet by mouth 2 (two) times daily.    [provider]  SYMBICORT 160-4.5 MCG/ACT inhaler USE 2 PUFFS TWICE DAILY. Patient taking differently: Inhale 2 puffs into the lungs 2 (two) times daily. 11/29/20   Binnie Rail, MD    Allergies    Niacin-lovastatin er, Penicillins, Sulfonamide derivatives, and Atorvastatin  Review of Systems   Review of Systems  Constitutional:  Negative for fever.  HENT:  Negative for sore throat.  Respiratory:  Positive for cough and shortness of breath. Negative for hemoptysis and wheezing.   Cardiovascular:  Negative for chest pain.  Gastrointestinal:  Positive for nausea and vomiting. Negative for abdominal pain, blood in stool, constipation and diarrhea.  Genitourinary:  Negative for flank pain.  Musculoskeletal:  Negative for back pain.  Skin:  Negative for pallor and wound.  Neurological:  Negative for light-headedness and numbness.  All other systems reviewed and are negative.  Physical Exam Updated Vital Signs BP 137/68   Pulse 92   Temp 98.6 F (37 C) (Oral)   Resp 19   Ht 6\' 1"  (1.854 m)   Wt 88.9 kg   SpO2 97%   BMI 25.86 kg/m   Physical Exam Vitals and nursing note reviewed.  Constitutional:      Appearance: He is ill-appearing.  HENT:     Head: Normocephalic and atraumatic.     Mouth/Throat:     Comments: Oropharynx appears dry, and erythematous without any visible signs of exudates. Eyes:     Pupils:  Pupils are equal, round, and reactive to light.  Cardiovascular:     Rate and Rhythm: Normal rate.     Comments: No bilateral pitting edema, no calf tenderness. Pulmonary:     Effort: Tachypnea present. No respiratory distress.     Breath sounds: Decreased breath sounds present. No wheezing.  Chest:     Chest wall: No tenderness.  Musculoskeletal:     Cervical back: Normal range of motion and neck supple.     Right lower leg: No edema.     Left lower leg: No tenderness. No edema.  Skin:    General: Skin is warm and dry.  Neurological:     Mental Status: He is alert and oriented to person, place, and time.    ED Results / Procedures / Treatments   Labs (all labs ordered are listed, but only abnormal results are displayed) Labs Reviewed  COMPREHENSIVE METABOLIC PANEL - Abnormal; Notable for the following components:      Result Value   Potassium 3.2 (*)    CO2 21 (*)    Glucose, Bld 136 (*)    BUN 26 (*)    Creatinine, Ser 2.12 (*)    Calcium 8.6 (*)    Albumin 3.0 (*)    Alkaline Phosphatase 147 (*)    Total Bilirubin 1.8 (*)    GFR, Estimated 31 (*)    All other components within normal limits  BRAIN NATRIURETIC PEPTIDE - Abnormal; Notable for the following components:   B Natriuretic Peptide 380.8 (*)    All other components within normal limits  CBC WITH DIFFERENTIAL/PLATELET - Abnormal; Notable for the following components:   RBC 4.20 (*)    Hemoglobin 12.8 (*)    Lymphs Abs 0.2 (*)    Monocytes Absolute 0.0 (*)    All other components within normal limits  MAGNESIUM - Abnormal; Notable for the following components:   Magnesium <0.5 (*)    All other components within normal limits  PROTIME-INR - Abnormal; Notable for the following components:   Prothrombin Time 21.2 (*)    INR 1.8 (*)    All other components within normal limits  I-STAT VENOUS BLOOD GAS, ED - Abnormal; Notable for the following components:   pH, Ven 7.433 (*)    pCO2, Ven 34.0 (*)    pO2, Ven  60.0 (*)    Potassium 3.3 (*)    Calcium, Ion 1.08 (*)  HCT 37.0 (*)    Hemoglobin 12.6 (*)    All other components within normal limits  TROPONIN I (HIGH SENSITIVITY) - Abnormal; Notable for the following components:   Troponin I (High Sensitivity) 163 (*)    All other components within normal limits  TROPONIN I (HIGH SENSITIVITY)    EKG EKG Interpretation  Date/Time:  Saturday July 01 2021 17:44:09 EDT Ventricular Rate:  102 PR Interval:    QRS Duration: 91 QT Interval:  353 QTC Calculation: 460 R Axis:   48 Text Interpretation: Sinus rhythm Borderline T wave abnormalities No significant change since last tracing Confirmed by Blanchie Dessert ([id number]) on 07/01/2021 6:22:25 PM  Radiology DG Chest Port 1 View  Result Date: 07/01/2021 CLINICAL DATA:  COVID.  Shortness of breath. EXAM: PORTABLE CHEST 1 VIEW COMPARISON:  CT angiogram chest and chest x-ray 06/30/2021. FINDINGS: Small right pleural effusion is unchanged. There is linear atelectasis or scarring in the right lower lung similar to the prior study. There is no new focal lung infiltrate. There is no pneumothorax. The cardiomediastinal silhouette is within normal limits for projection. No acute fractures are seen. IMPRESSION: 1. Stable right pleural effusion with right lower lung scarring. 2. No new lung infiltrate. Electronically Signed   By: Ronney Asters M.D.   On: 07/01/2021 18:46   DG Chest Portable 1 View  Result Date: 06/30/2021 CLINICAL DATA:  Chest and abdominal pain.  COVID. EXAM: PORTABLE CHEST 1 VIEW COMPARISON:  04/21/2020 FINDINGS: Normal heart size and mediastinal contours. Implantable cardiac device. Chronic scarring and loculated pleural effusion at the right base. Generalized interstitial coarsening is also stable. No pulmonary edema or air leak. Extensive artifact from EKG leads. IMPRESSION: Baseline appearance of the chest when compared to 2021. Electronically Signed   By: Jorje Guild M.D.   On:  06/30/2021 05:45   CT Angio Chest/Abd/Pel for Dissection W and/or Wo Contrast  Result Date: 06/30/2021 CLINICAL DATA:  Abdominal pain, chest pain, nausea, concern for dissection or aneurysm, history of lung cancer status post right lower lobectomy 2012 EXAM: CT ANGIOGRAPHY CHEST, ABDOMEN AND PELVIS TECHNIQUE: Non-contrast CT of the chest was initially obtained. Multidetector CT imaging through the chest, abdomen and pelvis was performed using the standard protocol during bolus administration of intravenous contrast. Multiplanar reconstructed images and MIPs were obtained and reviewed to evaluate the vascular anatomy. CONTRAST:  82mL OMNIPAQUE IOHEXOL 350 MG/ML SOLN COMPARISON:  01/13/2020, 01/29/2020, 02/08/2021 FINDINGS: CTA CHEST FINDINGS Cardiovascular: Thoracic aortic atherosclerosis without significant aneurysm or dissection. Patent three-vessel arch anatomy with mild tortuosity. No mediastinal hemorrhage or hematoma. No significant central proximal hilar pulmonary embolus. Motion artifact through the lower lobe segmental branches. Normal heart size. No pericardial effusion. Native coronary atherosclerosis. Mediastinum/Nodes: No significant thyroid abnormality. Trachea and central airways are patent. Esophagus nondilated. No hiatal hernia. Continued enlargement of the high right paratracheal lymph node now measuring 1.8 cm in short axis, previously 1.2 cm. Left paratracheal lymph nodes are also larger measuring 1.3 cm, previously 0.8 cm. No supraclavicular or axillary adenopathy. Lungs/Pleura: Limited with motion artifact. Background emphysema. Patchy ground-glass attenuation compatible with air trapping. Slight anterior septal thickening in the apices. Postop changes from right lower lobectomy. Similar pleuroparenchymal nodular bandlike scarring in the residual right upper lobe and right middle lobe. Slight increase in the right effusion. Chronic right pleural thickening/rind. Trace amount of fluid within  the residual fissure on the right side. Overall stable left lung aeration. No new acute airspace process or pneumonia. Musculoskeletal: Degenerative change noted  of the spine. No acute or significant osseous finding. No compression fracture. Sternum intact. Review of the MIP images confirms the above findings. CTA ABDOMEN AND PELVIS FINDINGS VASCULAR Aorta: Atherosclerotic changes of the abdominal aorta. Infrarenal aorta has a fusiform atherosclerotic aneurysm measuring 3.7 cm, previously 3.4 cm. No acute aortic finding. Negative for dissection. Celiac: Atherosclerotic origin but remains patent including its branches SMA: Atherosclerotic origin with ostial narrowing but remains patent in the central mesentery Renals: Atherosclerotic origins with some degree of renal artery stenosis suspected bilaterally. Difficult to truly assess by CTA. Left renal atrophy noted. IMA: Similar proximal occlusion as before. Inflow: Heavily calcified pelvic iliac vessels without acute vascular finding or iliac occlusion. 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 No acute vascular process. Veins: Dedicated venous phase imaging not performed Review of the MIP images confirms the above findings. NON-VASCULAR Hepatobiliary: Arterial phase imaging only. Increased hepatic attenuation along the gallbladder fossa, posterior right liver, and right inferior liver margin may represent vascular shunting or transient hepatic attenuation defects. Limited assessment by single phase arterial imaging. No definite focal abnormality. Cholelithiasis again noted. Gallbladder nondistended. Common bile duct nondilated. No biliary obstruction pattern. Pancreas: No focal pancreatic abnormality or surrounding inflammatory process. Pancreatic duct nondilated. Spleen: Normal in size without focal abnormality. Adrenals/Urinary Tract: Normal adrenal glands. Chronic left renal atrophy suggesting chronic renal vascular disease. No focal renal  abnormality. No acute process or obstruction. Ureters are symmetric and decompressed. No hydroureter. Bladder unremarkable. Stomach/Bowel: Negative for bowel obstruction, significant dilatation, ileus, or free air. Normal appendix in the right hemipelvis. Postop changes of the distal colon. No free fluid, fluid collection, hemorrhage, hematoma, abscess or ascites. No peritoneal nodularity. Lymphatic: No abdominopelvic bulky adenopathy. Reproductive: No significant finding by CT. Other: No abdominal wall hernia or abnormality. No abdominopelvic ascites. Musculoskeletal: Postop changes in the left inguinal region. No recurrent hernia. Stable osteopenia and degenerative changes. No acute osseous finding. Review of the MIP images confirms the above findings. 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 Negative for acute dissection. Recommend follow-up ultrasound every 2 years. This recommendation follows ACR consensus guidelines: White Paper of the ACR Incidental Findings Committee II on Vascular Findings. J Am Coll Radiol 2013; 10:789-794. Negative for significant acute pulmonary embolus. Stable postop changes of the right hemithorax from right lower lobectomy with similar right inferior lung bandlike and nodular pleuroparenchymal scarring as well as right effusion with diffuse right pleural thickening. Continued slight progression of right and left paratracheal adenopathy remaining indeterminate. This still could be infectious/inflammatory or reactive. Consider follow-up surveillance PET scan for further evaluation. Cholelithiasis No other acute intra-abdominal or pelvic finding. Electronically Signed   By: Jerilynn Mages.  Shick M.D.   On: 06/30/2021 09:26   US Abdomen Limited RUQ (LIVER/GB)  Result Date: 06/30/2021 CLINICAL DATA:  Right-sided abdominal pain EXAM: ULTRASOUND ABDOMEN LIMITED RIGHT UPPER QUADRANT COMPARISON:  None. FINDINGS: Gallbladder: No gallstones or wall thickening visualized. No  sonographic Murphy sign noted by sonographer. Common bile duct: Diameter: 9 mm, possibly normal for age. Where visualized, no filling defect. Liver: No focal lesion identified. Within normal limits in parenchymal echogenicity. Portal vein is patent on color Doppler imaging with normal direction of blood flow towards the liver. IMPRESSION: 1. No cholecystitis. 2. Upper normal common bile duct diameter, please correlate with pending liver labs. 3. There was a gallstone by PET-CT 02/08/2021 which is not visualized today. Electronically Signed   By: Jorje Guild M.D.   On: 06/30/2021 05:35    Procedures Procedures  Medications Ordered in ED Medications  lactated ringers infusion ( Intravenous New Bag/Given 07/01/21 1954)  magnesium sulfate IVPB 2 g 50 mL (has no administration in time range)  methylPREDNISolone sodium succinate (SOLU-MEDROL) 125 mg/2 mL injection 125 mg (has no administration in time range)  acetaminophen (TYLENOL) tablet 1,000 mg (1,000 mg Oral Given 07/01/21 1951)    ED Course  I have reviewed the triage vital signs and the nursing notes.  Pertinent labs & imaging results that were available during my care of the patient were reviewed by me and considered in my medical decision making (see chart for details).    MDM Rules/Calculators/A&P  Patient with extensive past medical history including COPD, right lower lobectomy, prior history of lung CA, CHF presents to the ED via EMS with a chief complaint of shortness of breath and hypoxia.  MS reported an oxygen saturation of 87% on room air.  Does not wear oxygen at baseline, placed on 5 L of O2 satting at 94% to 95%.  Primary evaluation he is overall ill-appearing, in no respiratory distress at the time.  Vitals are remarkable for oxygen saturations around 94%.  He was also evaluated yesterday at [facility] long ED for nausea and vomiting.  Had a CTA to rule out dissection along with balance of his AAA.  The work-up was benign and he  was discharged home.  On today's visit family daughter Troy [name] at the bedside reports he has not had much p.o. intake in the last couple of days.  He continues to have vomiting episodes, no emesis.  Was placed on molnupiravir and is currently taking this medication on day 5.  Lungs are diminished to auscultation throughout, tachypnea present on my exam.  Abdomen is soft, some tenderness to palpation but no focal point of tenderness.  No bilateral pitting edema or calf tenderness.  Moves all upper and lower extremities.  Labs on today's visit reveal a CMP with some mild hypokalemia at 3.2, suspect likely due to GI loss with multiple episodes of vomiting.  Creatinine level is 2.1, according to family member patient has not have much intake in the last couple days.  CBC with no leukocytosis, hemoglobin is slightly decreased.  BNP slightly increased at 388, magnesium level is less than 0.5, provided with 2 g of magnesium IV infusion.  BG obtained, pH of 7.4, bicarb is normal, currently satting at 97% with 5 L of O2 via nasal cannula.  First troponin is 163, no prior history of elevated troponins in the past.  Creatinine has worsened in the last 24 hours.  I feel that patient is in need of admission at this time due to his worsening symptoms. X-ray of his chest showed: 1. Stable right pleural effusion with right lower lung scarring.  2. No new lung infiltrate.   He is receiving fluids at a rate of 125 an hour.  We will place call for hospitalist admission at this time.  Of note, patient is COVID-positive, with a positive test in her system.  8:57 PM Spoke to hospitalist service Dr. Velia Meyer who will admit patient for further management.   BP 137/68   Pulse 92   Temp 98.6 F (37 C) (Oral)   Resp 19   Ht 6\' 1"  (1.854 m)   Wt 88.9 kg   SpO2 97%   BMI 25.86 kg/m    Portions of this note were generated with [employer]. Dictation errors may occur despite best attempts at proofreading.   Final Clinical  Impression(s) / ED Diagnoses Final diagnoses:  Shortness of breath  COVID-19 virus infection    Rx / DC Orders ED Discharge Orders     None        Janeece Fitting, PA-C 07/01/21 2058    Blanchie Dessert, MD 07/02/21 1904

## 2021-07-01 NOTE — ED Triage Notes (Signed)
Pt arrives via EMS from home for c/o acute onset SOB today. COVID positive on Monday. 86% on RA per EMS. Pt 80% on RA after scooting himself over on the bed. Placed on 6 L [place], SpO2 92%.

## 2021-07-01 NOTE — H&P (Signed)
History and Physical    PLEASE NOTE THAT DRAGON DICTATION SOFTWARE WAS USED IN THE CONSTRUCTION OF THIS NOTE.   Troy Labat [name] PYP:[id number] DOB: [birth year] DOA: 07/01/2021  PCP: Binnie Rail, MD Patient coming from: home   I have personally briefly reviewed patient's old medical records in [place]  Chief Complaint: sob  HPI: Troy Mcclenton [name] is a 80 y.o. male with medical history significant for COPD, paroxysmal atrial fibrillation chronically anticoagulated on Eliquis, chronic diastolic heart failure, stage IIIa chronic kidney disease with baseline creatinine 1.4-1.7, hypertension, who is admitted to [facility] on 07/01/2021 with acute hypoxic respiratory failure in the setting of severe covid-19 infection after presenting from home to [facility] ED complaining of shortness of breath.   The patient reports 1 week of progressive shortness of breath associated with new onset productive cough started on he notes associated subjective fever in the absence of chills, full body rigors, or generalized myalgias.  Reports that his shortness of breath has not been associated with any orthopnea, PND, or new onset peripheral edema.No recent chest pain, diaphoresis, palpitations, N/V, pre-syncope, or syncope.  Denies any associated wheezing, hemoptysis, new lower extremity erythema, or calf tenderness.  No recent trauma, travel, surgical procedures, or periods of prolonged diminished ambulatory activity. no recent melena or hematochezia.  No recent headache, neck stiffness, rhinitis, rhinorrhea, sore throat.   In the setting We will from patient reports that he underwent home COVID-19 testing on 06/26/2021 which was found to be positive, prompting outpatient initiation of Molnupirvavir for a 5 day course with which he completed.   He notes that he is also having experiencing 4 to 5 days of intermittent nausea resulting in 3-4 episodes of nonbloody, nonbilious emesis over that time,  with most recent episode of emesis occurring just prior to presenting to [facility] emergency department today.  After the first several episodes of vomiting, the patient was reports development of mild epigastric discomfort which she reports is worse with palpation over the abdomen.  Denies any associated diarrhea, rash. Denies dysuria, gross hematuria, or change in urinary urgency/frequency.   In the setting of the above nausea/vomiting and limited ability to tolerate any oral intake over the last few days, as well as in the setting of interval development of epigastric discomfort, patient presented to [facility] emergency department yesterday, with notable work-up in the ED included CTA of the chest abdomen pelvis which showed evidence of his known abdominal aortic aneurysm measuring 3.7 cm relative to most recent prior 3.4, without any evidence of associated dissection.  This imaging also showed no evidence of acute pulmonary embolism or other acute intrathoracic process.  In setting of his presenting abdominal discomfort, CTA abdomen/pelvis was also notable for gallbladder wall thickening, pericholecystic fluid, CBD dilation, or choledocholithiasis.  Labs performed at [facility] long ED notable for creatinine 1.39.  He also underwent COVID-19 PCR at [facility] long ED yesterday, confirming the positive result from home test on 06/26/21. He was subsequently discharged home from the emergency department, however, in the setting of further progression of his shortness of breath as well as perpetuation of his nausea/vomiting, he presented to [facility] emergency department this evening for further evaluation management of the above.  He confirms a history of COPD without any known baseline supplemental oxygen requirement.  Reports compliance with his home respiratory includes scheduled Symbicort as well as as needed albuterol.  He notes increased use of his as needed albuterol inhaler over the  last few days, but  conveys no significant provement in his shortness of breath with this measure.  He also has a history of chronic diastolic heart failure, with most recent echocardiogram performed in April 2021 demonstrating LVEF 60 to 65%, no evidence of focal wall motion abnormalities, indeterminate diastolic parameters, and no evidence of significant valvular pathology.  He reports good compliance with his outpatient diuretic regimen which consists of Lasix 60 mg p.o. twice daily. No h/o diabetes.      ED Course:  Vital signs in the ED were notable for the following: Tetramex 98.6, initial heart rate 101, which decreased into the range of 89-90 following initiation of IV fluids; blood pressure 111/92 -137/68; respiratory rates 19-26; initial oxygen saturation 86% on room air, which is improved to 96 to 97% on 4 L nasal cannula.   Labs were notable for the following: CMP notable for the following: Potassium 3.2, bicarbonate 21, anion gap 14, BUN 26, creatinine 2.12, calcium adjusted for hypoalbuminemia 9.4, albumin 3.0, with additional liver enzymes otherwise noted to be within normal limits.  BNP 380 compared to 190 in April 2022.  Initial high-sensitivity troponin I 163.  CBC notable for white cell count 4900, hemoglobin 12.8 compared to 13.8 on 06/30/2021.  Imaging and additional notable ED work-up: EKG shows sinus rhythm with heart rate 102, notable intervals including QTC 460, no evidence of T wave or ST changes, including no evidence of ST elevation.  Chest x-ray showed stable right pleural effusion without evidence of infiltrate, edema, or pneumothorax.  While in the ED, the following were administered: Solumedrol 125 mg IV x1, magnesium sulfate 2 g IV over 2 hours x 1 dose, continuous LR at 125 cc/h.  Subsequently, the patient was admitted to the PCU for further evaluation and management presenting acute hypoxic respiratory failure in setting of severe COVID-19 infection.    Review of Systems: As per HPI  otherwise 10 point review of systems negative.   Past Medical History:  Diagnosis Date   AAA (abdominal aortic aneurysm) LAST ABDOMINAL US 10-20-17 3.3 CM   MONITORED BY DR [name]   Allergy    Anxiety    Arthritis    Atrial fibrillation (HCC)    Basal cell carcinoma    Bilateral carotid artery stenosis DUPLEX 12-29-2012  BY DR [facility]   BILATERAL ICA STENOSIS 60-79%   BPH (benign prostatic hypertrophy)    Cataract    removed both eyes   CHF (congestive heart failure) (HCC)    Chronic diastolic heart failure (HCC)    Chronic kidney disease    stage 3, pt unaware   Colon cancer ([place])    Complication of anesthesia    had nausea after carotid artery surgery, states the medicine given to help the nausea, made it worse   Constipation    COPD (chronic obstructive pulmonary disease) (HCC)    Depression    Emphysema of lung (HCC)    GERD (gastroesophageal reflux disease)    Glaucoma BOTH EYES   Dr Gershon Crane   History of basal cell carcinoma excision    History of lung cancer APRIL 2012  SQUAMOUS CELL---- S/P RIGHT LOWER LOBECTOMY AT [facility] --  NO CHEMORADIATION---  NO RECURRENCE    ONCOLOGIST- DR Tressie Stalker  LOV IN [facility] 10-27-2012   History of pneumothorax    pt unaware   Hx of adenomatous colonic polyps 2005    X 2; 1 hyperplastic polyp; Dr Olevia Perches   Hyperlipidemia    Hypertension  Impaired fasting glucose 2007   108; A1c5.4%   Lesion of bladder    Lung cancer (HCC) 01/25/2012   Microhematuria    OSA (obstructive sleep apnea) 08/29/2015   CPAP SET ON 10   PAD (peripheral artery disease) (HCC) ABI'S  JAN 2014  0.65 ON RIGHT ;  1.04 ON LEFT   Peripheral vascular disease (HCC) S/P ANGIOPLASTY AND STENTING   FOLLOWED  BY DR Edilia Bo   Pleural effusion on right 11/18/2018   Chronic, noted on CXR   Sleep apnea    + cpap    Spinal stenosis Sept. 2015   Status post placement of implantable loop recorder    Stroke [facility]) Jul 07, 2013   mini  TIA   Thoracic aorta atherosclerosis (HCC)     Thyrotoxicosis    amiodarone induced   Urgency of urination     Past Surgical History:  Procedure Laterality Date   ANGIO PLASTY     X 4 in legs   AORTOGRAM  07-27-2002   MILD DIFFUSE ILIAC ARTERY OCCLUSIVE DISEASE /  LEFT RENAL ARTERY 20%/ PATENT LEFT FEM-POP GRAFT/ MILD SFA AND POPLITEAL ARTERY OCCLUSIVE DISEASE W/ SEVERE KIDNEY OCCLUSIVE DISEASE   ATRIAL FIBRILLATION ABLATION N/A 04/09/2019   Procedure: ATRIAL FIBRILLATION ABLATION;  Surgeon: Hillis Range, MD;  [facility] INVASIVE CV LAB;  Service: Cardiovascular;  Laterality: N/A;   ATRIAL FIBRILLATION ABLATION N/A 02/12/2020   Procedure: ATRIAL FIBRILLATION ABLATION;  Surgeon: Hillis Range, MD;  Location: [facility] INVASIVE CV LAB;  Service: Cardiovascular;  Laterality: N/A;   BASAL CELL CARCINOMA EXCISION     MULTIPLE TIMES--  RIGHT FOREARM, CHEEKS, AND BACK   BIOPSY  06/25/2018   Procedure: BIOPSY;  Surgeon: Lemar Lofty., MD;  Location: [facility] ENDOSCOPY;  Service: Gastroenterology;;   CARDIOVASCULAR STRESS TEST  12-08-2012  DR [facility]   NORMAL LEXISCAN WITH NO EXERCISE NUCLEAR STUDY/ EF 66%/   NO ISCHEMIA/ NO SIGNIFICANT CHANGE FROM PRIOR STUDY   CARDIOVERSION N/A 02/14/2018   Procedure: CARDIOVERSION;  Surgeon: Laurey Morale, MD;  Location: [facility] ENDOSCOPY;  Service: Cardiovascular;  Laterality: N/A;   CAROTID ANGIOGRAM N/A 07/10/2013   Procedure: CAROTID ANGIOGRAM;  Surgeon: Sherren Kerns, MD;  Location: [facility] CATH LAB;  Service: Cardiovascular;  Laterality: N/A;   CAROTID ENDARTERECTOMY Right 07-14-13   cea   CATARACT EXTRACTION W/ INTRAOCULAR LENS  IMPLANT, BILATERAL     colon polyectomy     COLONOSCOPY     COLONOSCOPY WITH PROPOFOL N/A 06/25/2018   Procedure: COLONOSCOPY WITH PROPOFOL;  Surgeon: Meridee Score Netty Starring., MD;  Location: [facility] ENDOSCOPY;  Service: Gastroenterology;  Laterality: N/A;   CYSTOSCOPY W/ RETROGRADES Bilateral 01/21/2013   Procedure: CYSTOSCOPY WITH RETROGRADE PYELOGRAM;  Surgeon: Milford Cage, MD;  Location: [facility];  Service: Urology;  Laterality: Bilateral;   CYSTO, BLADDER BIOPSY, BILATERAL RETROGRADE PYELOGRAM  RAD TECH FROM RADIOLOGY PER JOY   CYSTOSCOPY WITH BIOPSY N/A 01/21/2013   Procedure: CYSTOSCOPY WITH BIOPSY;  Surgeon: Milford Cage, MD;  Location: [facility];  Service: Urology;  Laterality: N/A;   ENDARTERECTOMY Right 07/14/2013   Procedure: ENDARTERECTOMY CAROTID;  Surgeon: Chuck Hint, MD;  Location: [facility] OR;  Service: Vascular;  Laterality: Right;   EP IMPLANTABLE DEVICE N/A 08/12/2015   Procedure: Loop Recorder Insertion;  Surgeon: Hillis Range, MD;  [facility] INVAS[facility] CV LAB;  Service: Cardiovascular;  Laterality: N/A;   ESOPHAGOGASTRODUODENOSCOPY (EGD) WITH PROPOFOL N/A 06/25/2018   Procedure: ESOPHAGOGASTRODUODENOSCOPY (EGD) WITH PROPOFOL;  Surgeon: Irving Copas., MD;  Location: [place];  Service: Gastroenterology;  Laterality: N/A;   ESOPHAGOGASTRODUODENOSCOPY (EGD) WITH PROPOFOL N/A 07/10/2018   Procedure: ESOPHAGOGASTRODUODENOSCOPY (EGD) WITH PROPOFOL;  Surgeon: Milus Banister, MD;  Location: [facility] ENDOSCOPY;  Service: Endoscopy;  Laterality: N/A;   EUS N/A 07/10/2018   Procedure: UPPER ENDOSCOPIC ULTRASOUND (EUS) RADIAL;  Surgeon: Milus Banister, MD;  Location: [facility] ENDOSCOPY;  Service: Endoscopy;  Laterality: N/A;   EYE SURGERY Right    FEMORAL-POPLITEAL BYPASS GRAFT Left [place]   AND 2001  IN [place]   FEMORAL-POPLITEAL BYPASS GRAFT Left 08/30/2015   Procedure: REVISION OF BYPASS GRAFT Left  FEMORAL-POPLITEAL ARTERY;  Surgeon: Angelia Mould, MD;  Location: [place];  Service: Vascular;  Laterality: Left;   FINE NEEDLE ASPIRATION N/A 07/10/2018   Procedure: FINE NEEDLE ASPIRATION (FNA) LINEAR;  Surgeon: Milus Banister, MD;  Location: [facility] ENDOSCOPY;  Service: Endoscopy;  Laterality: N/A;   FLEXIBLE SIGMOIDOSCOPY N/A 12/12/2018   Procedure: FLEXIBLE SIGMOIDOSCOPY;   Surgeon: Ileana Roup, MD;  Location: [facility] ORS;  Service: General;  Laterality: N/A;   LARYNGOSCOPY  06-27-2004   BX VOCAL CORD  (LEUKOPLAKIA)  PER PT NO ISSUES SINCE   LEFT HEART CATH AND CORONARY ANGIOGRAPHY N/A 08/20/2017   Procedure: LEFT HEART CATH AND CORONARY ANGIOGRAPHY;  Surgeon: Larey Dresser, MD;  Location: [place] CV LAB;  Service: Cardiovascular;  Laterality: N/A;   LOOP RECORDER INSERTION N/A 04/09/2019   Procedure: LOOP RECORDER INSERTION;  Surgeon: Thompson Grayer, MD;  Location: [place] CV LAB;  Service: Cardiovascular;  Laterality: N/A;   LOOP RECORDER REMOVAL N/A 04/09/2019   Procedure: LOOP RECORDER REMOVAL;  Surgeon: Thompson Grayer, MD;  Location: [place] CV LAB;  Service: Cardiovascular;  Laterality: N/A;   LOWER EXTREMITY ANGIOGRAM Bilateral 08/29/2015   Procedure: Lower Extremity Angiogram;  Surgeon: Angelia Mould, MD;  Location: [place] CV LAB;  Service: Cardiovascular;  Laterality: Bilateral;   LUNG LOBECTOMY  01/24/2011    RIGHT UPPER LOBE  (SQUAMOUS CELL CARCINOMA) Dr Dorthea [place] , [facility]. No chemotherapyor radiation   PATCH ANGIOPLASTY Right 07/14/2013   Procedure: PATCH ANGIOPLASTY;  Surgeon: Angelia Mould, MD;  Location: [place];  Service: Vascular;  Laterality: Right;   PATCH ANGIOPLASTY Left 08/30/2015   Procedure: VEIN PATCH ANGIOPLASTY OF PROXIMAL Left BYPASS GRAFT;  Surgeon: Angelia Mould, MD;  Location: [place];  Service: Vascular;  Laterality: Left;   PERIPHERAL VASCULAR CATHETERIZATION N/A 08/29/2015   Procedure: Abdominal Aortogram;  Surgeon: Angelia Mould, MD;  Location: [place] CV LAB;  Service: Cardiovascular;  Laterality: N/A;   POLYPECTOMY  06/25/2018   Procedure: POLYPECTOMY;  Surgeon: Rush Landmark Telford Nab., MD;  Location: [place];  Service: Gastroenterology;;   POLYPECTOMY     SUBMUCOSAL INJECTION  06/25/2018   Procedure: SUBMUCOSAL INJECTION;  Surgeon: Irving Copas., MD;  Location: [place];  Service: Gastroenterology;;   TEE WITHOUT CARDIOVERSION N/A 02/14/2018   Procedure: TRANSESOPHAGEAL ECHOCARDIOGRAM (TEE);  Surgeon: Larey Dresser, MD;  Location: [facility] ENDOSCOPY;  Service: Cardiovascular;  Laterality: N/A;   trabecular surgery     OS   TRANSTHORACIC ECHOCARDIOGRAM  12-29-2012  DR [facility]   MILD LVH/  LVSF NORMAL/ EF 75-10%/  GRADE I DIASTOLIC DYSFUNCTION    Social History:  reports that he quit smoking about 6 years ago. His smoking use included cigarettes. He has a 25.00 pack-year smoking history. He has never used smokeless tobacco. He reports current alcohol  use of about 4.0 standard drinks per week. He reports that he does not use drugs.   Allergies  Allergen Reactions   Niacin-Lovastatin Er Shortness Of Breath and Other (See Comments)    Dyspnea, flushing   Penicillins Hives, Shortness Of Breath and Other (See Comments)    Flushing  & dyspnea Because of a history of documented adverse serious drug reaction;Medi Alert bracelet  is recommended PCN reaction causing immediate rash, facial/tongue/throat swelling, SOB or lightheadedness with hypotension: Yes PCN reaction causing severe rash involving mucus membranes or skin necrosis: No PCN reaction occurring within the last 10 years: NO PCN reaction that required hospitalization: NO   Sulfonamide Derivatives Hives, Shortness Of Breath and Other (See Comments)    Flushing & dyspnea Because of a history of documented adverse serious drug reaction;Medi Alert bracelet  is recommended   Atorvastatin Other (See Comments)    Myalgias & athralgias- takes Crestor, DOES NOT LIKE LIPITOR    Family History  Problem Relation Age of Onset   Stroke Mother        mini strokes   Alcohol abuse Father    Heart disease Father        MI after 13   Stroke Father    Hypertension Father    Heart attack Father    Heart disease Paternal Aunt        several   Hypertension Paternal Aunt        several   Stroke Paternal  Aunt        several   Stroke Paternal Uncle        several   Heart disease Paternal Uncle        several;2 had MI pre 77   Cancer Daughter 65       breast ca, also with benign sessile polyp    Colon cancer Daughter 59   Colon polyps Neg Hx    Esophageal cancer Neg Hx    Rectal cancer Neg Hx    Stomach cancer Neg Hx     Family history reviewed and not pertinent    Prior to Admission medications   Medication Sig Start Date End Date Taking? Authorizing Provider  amLODipine (NORVASC) 5 MG tablet TAKE ONE TABLET ONCE DAILY, Patient taking differently: Take 5 mg by mouth daily. 06/27/21   Larey Dresser, MD  Brinzolamide-Brimonidine [facility]) 1-0.2 % SUSP Place 1 drop into both eyes 2 times daily. 04/16/20   [provider]  docusate sodium (COLACE) 100 MG capsule Take 100 mg by mouth 2 (two) times daily.     [provider]  ELIQUIS 5 MG TABS tablet TAKE ONE TABLET BY MOUTH TWICE DAILY. Patient taking differently: Take 5 mg by mouth 2 (two) times daily. 06/27/21   Larey Dresser, MD  empagliflozin (JARDIANCE) 10 MG TABS tablet Take 1 tablet (10 mg total) by mouth daily before breakfast. 12/07/20   Larey Dresser, MD  escitalopram (LEXAPRO) 10 MG tablet TAKE ONE TABLET ONCE DAILY Patient taking differently: Take 10 mg by mouth daily. 04/05/21   Binnie Rail, MD  famotidine (PEPCID) 20 MG tablet Take 20 mg by mouth daily as needed for heartburn.    [provider]  furosemide (LASIX) 20 MG tablet Take 3 tablets (60 mg total) by mouth 2 (two) times daily. 04/07/21   Larey Dresser, MD  latanoprost (XALATAN) 0.005 % ophthalmic solution Place 1 drop into both eyes at bedtime. 05/16/20   [provider]  losartan (  COZAAR) 25 MG tablet Take 1 tablet (25 mg total) by mouth daily. 12/07/20   Larey Dresser, MD  Melatonin 10 MG CAPS Take 10 mg by mouth at bedtime.    [provider]  metoprolol succinate (TOPROL-XL) 25 MG 24 hr tablet TAKE 1/2 TABLET  IN THE EVENING WITH OR IMMEDIATELY FOLLOWING A MEAL Patient taking differently: Take 12.5 mg by mouth daily. 06/27/21   Larey Dresser, MD  molnupiravir EUA (LAGEVRIO) 200 mg CAPS capsule Take 4 capsules (800 mg total) by mouth 2 (two) times daily for 5 days. 06/26/21 07/01/21  Loeffler, Adora Fridge, PA-C  montelukast (SINGULAIR) 10 MG tablet TAKE ONE TABLET AT BEDTIME. Patient taking differently: Take 10 mg by mouth at bedtime. 04/05/21   Binnie Rail, MD  Multiple Vitamins-Minerals (CENTRUM SILVER PO) Take 1 tablet by mouth daily.    [provider]  omeprazole (PRILOSEC) 20 MG capsule TAKE (1) CAPSULE TWICE DAILY. Patient taking differently: Take 20 mg by mouth 2 (two) times daily. 05/01/21   Mansouraty, Telford Nab., MD  Polyethyl Glycol-Propyl Glycol 0.4-0.3 % SOLN Place 1 drop into both eyes daily as needed (for dry eyes).     [provider]  potassium chloride SA (KLOR-CON) 20 MEQ tablet TAKE ONE TABLET ONCE DAILY. Patient taking differently: 20 mEq daily. 06/27/21   Larey Dresser, MD  Probiotic Product (ALIGN) 4 MG CAPS Take 4 mg by mouth daily.    [provider]  rosuvastatin (CRESTOR) 40 MG tablet TAKE ONE TABLET ONCE DAILY Patient taking differently: Take 40 mg by mouth daily. 06/27/21   Larey Dresser, MD  senna (SENOKOT) 8.6 MG tablet Take 1 tablet by mouth 2 (two) times daily.    [provider]  SYMBICORT 160-4.5 MCG/ACT inhaler USE 2 PUFFS TWICE DAILY. Patient taking differently: Inhale 2 puffs into the lungs 2 (two) times daily. 11/29/20   Binnie Rail, MD     Objective    Physical Exam: Vitals:   07/01/21 1815 07/01/21 1830 07/01/21 1915 07/01/21 1945  BP: 99/76 111/62 123/72 137/68  Pulse: 97 89 92 92  Resp: (!) $RemoveB'21 20 19 19  'WUuFnqBw$ Temp:      TempSrc:      SpO2: 95% 97% 96% 97%  Weight:      Height:        General: appears to be stated age; alert, oriented; increased work of breathing noted Skin: warm, dry, no rash Head:   AT/ Mouth:  Oral mucosa membranes appear dry, normal dentition Neck: supple; trachea midline Heart: Mildly tachycardic, but regular; did not appreciate any M/R/G Lungs: CTAB, did not appreciate any wheezes, rales, or rhonchi Abdomen: + BS; soft, ND, NT Vascular: 2+ pedal pulses b/l; 2+ radial pulses b/l Extremities: no peripheral edema, no muscle wasting Neuro: strength and sensation intact in upper and lower extremities b/l    Labs on Admission: I have personally reviewed following labs and imaging studies  CBC: Recent Labs  Lab 06/30/21 0454 07/01/21 1823 07/01/21 1839  WBC 6.1 4.9  --   NEUTROABS 5.4 4.7  --   HGB 13.8 12.8* 12.6*  HCT 41.7 39.3 37.0*  MCV 93.7 93.6  --   PLT 234 192  --    Basic Metabolic Panel: Recent Labs  Lab 06/30/21 0454 07/01/21 1822 07/01/21 1823 07/01/21 1839  NA 137  --  136 138  K 3.7  --  3.2* 3.3*  CL 105  --  101  --  CO2 23  --  21*  --   GLUCOSE 151*  --  136*  --   BUN 23  --  26*  --   CREATININE 1.39*  --  2.12*  --   CALCIUM 8.7*  --  8.6*  --   MG  --  <0.5*  --   --    GFR: Estimated Creatinine Clearance: 31.4 mL/min (A) (by C-G formula based on SCr of 2.12 mg/dL (H)). Liver Function Tests: Recent Labs  Lab 06/30/21 0454 07/01/21 1823  AST 15 27  ALT 13 20  ALKPHOS 67 147*  BILITOT 0.8 1.8*  PROT 6.9 6.5  ALBUMIN 3.5 3.0*   Recent Labs  Lab 06/30/21 0454  LIPASE 26   No results for input(s): AMMONIA in the last 168 hours. Coagulation Profile: Recent Labs  Lab 07/01/21 1822  INR 1.8*   Cardiac Enzymes: No results for input(s): CKTOTAL, CKMB, CKMBINDEX, TROPONINI in the last 168 hours. BNP (last 3 results) No results for input(s): PROBNP in the last 8760 hours. HbA1C: No results for input(s): HGBA1C in the last 72 hours. CBG: No results for input(s): GLUCAP in the last 168 hours. Lipid Profile: No results for input(s): CHOL, HDL, LDLCALC, TRIG, CHOLHDL, LDLDIRECT in the last 72 hours. Thyroid  Function Tests: No results for input(s): TSH, T4TOTAL, FREET4, T3FREE, THYROIDAB in the last 72 hours. Anemia Panel: No results for input(s): VITAMINB12, FOLATE, FERRITIN, TIBC, IRON, RETICCTPCT in the last 72 hours. Urine analysis:    Component Value Date/Time   COLORURINE STRAW (A) 06/30/2021 0454   APPEARANCEUR CLEAR 06/30/2021 0454   LABSPEC 1.016 06/30/2021 0454   PHURINE 7.0 06/30/2021 0454   GLUCOSEU >=500 (A) 06/30/2021 0454   HGBUR NEGATIVE 06/30/2021 0454   BILIRUBINUR NEGATIVE 06/30/2021 0454   KETONESUR 5 (A) 06/30/2021 0454   PROTEINUR NEGATIVE 06/30/2021 0454   UROBILINOGEN 1.0 07/10/2013 1500   NITRITE NEGATIVE 06/30/2021 0454   LEUKOCYTESUR NEGATIVE 06/30/2021 0454    Radiological Exams on Admission: DG Chest Port 1 View  Result Date: 07/01/2021 CLINICAL DATA:  COVID.  Shortness of breath. EXAM: PORTABLE CHEST 1 VIEW COMPARISON:  CT angiogram chest and chest x-ray 06/30/2021. FINDINGS: Small right pleural effusion is unchanged. There is linear atelectasis or scarring in the right lower lung similar to the prior study. There is no new focal lung infiltrate. There is no pneumothorax. The cardiomediastinal silhouette is within normal limits for projection. No acute fractures are seen. IMPRESSION: 1. Stable right pleural effusion with right lower lung scarring. 2. No new lung infiltrate. Electronically Signed   By: Ronney Asters M.D.   On: 07/01/2021 18:46   DG Chest Portable 1 View  Result Date: 06/30/2021 CLINICAL DATA:  Chest and abdominal pain.  COVID. EXAM: PORTABLE CHEST 1 VIEW COMPARISON:  04/21/2020 FINDINGS: Normal heart size and mediastinal contours. Implantable cardiac device. Chronic scarring and loculated pleural effusion at the right base. Generalized interstitial coarsening is also stable. No pulmonary edema or air leak. Extensive artifact from EKG leads. IMPRESSION: Baseline appearance of the chest when compared to 2021. Electronically Signed   By: Jorje Guild M.D.   On: 06/30/2021 05:45   CT Angio Chest/Abd/Pel for Dissection W and/or Wo Contrast  Result Date: 06/30/2021 CLINICAL DATA:  Abdominal pain, chest pain, nausea, concern for dissection or aneurysm, history of lung cancer status post right lower lobectomy 2012 EXAM: CT ANGIOGRAPHY CHEST, ABDOMEN AND PELVIS TECHNIQUE: Non-contrast CT of the chest was initially obtained. Multidetector CT imaging  through the chest, abdomen and pelvis was performed using the standard protocol during bolus administration of intravenous contrast. Multiplanar reconstructed images and MIPs were obtained and reviewed to evaluate the vascular anatomy. CONTRAST:  24mL OMNIPAQUE IOHEXOL 350 MG/ML SOLN COMPARISON:  01/13/2020, 01/29/2020, 02/08/2021 FINDINGS: CTA CHEST FINDINGS Cardiovascular: Thoracic aortic atherosclerosis without significant aneurysm or dissection. Patent three-vessel arch anatomy with mild tortuosity. No mediastinal hemorrhage or hematoma. No significant central proximal hilar pulmonary embolus. Motion artifact through the lower lobe segmental branches. Normal heart size. No pericardial effusion. Native coronary atherosclerosis. Mediastinum/Nodes: No significant thyroid abnormality. Trachea and central airways are patent. Esophagus nondilated. No hiatal hernia. Continued enlargement of the high right paratracheal lymph node now measuring 1.8 cm in short axis, previously 1.2 cm. Left paratracheal lymph nodes are also larger measuring 1.3 cm, previously 0.8 cm. No supraclavicular or axillary adenopathy. Lungs/Pleura: Limited with motion artifact. Background emphysema. Patchy ground-glass attenuation compatible with air trapping. Slight anterior septal thickening in the apices. Postop changes from right lower lobectomy. Similar pleuroparenchymal nodular bandlike scarring in the residual right upper lobe and right middle lobe. Slight increase in the right effusion. Chronic right pleural thickening/rind. Trace amount  of fluid within the residual fissure on the right side. Overall stable left lung aeration. No new acute airspace process or pneumonia. Musculoskeletal: Degenerative change noted of the spine. No acute or significant osseous finding. No compression fracture. Sternum intact. Review of the MIP images confirms the above findings. CTA ABDOMEN AND PELVIS FINDINGS VASCULAR Aorta: Atherosclerotic changes of the abdominal aorta. Infrarenal aorta has a fusiform atherosclerotic aneurysm measuring 3.7 cm, previously 3.4 cm. No acute aortic finding. Negative for dissection. Celiac: Atherosclerotic origin but remains patent including its branches SMA: Atherosclerotic origin with ostial narrowing but remains patent in the central mesentery Renals: Atherosclerotic origins with some degree of renal artery stenosis suspected bilaterally. Difficult to truly assess by CTA. Left renal atrophy noted. IMA: Similar proximal occlusion as before. Inflow: Heavily calcified pelvic iliac vessels without acute vascular finding or iliac occlusion. 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 No acute vascular process. Veins: Dedicated venous phase imaging not performed Review of the MIP images confirms the above findings. NON-VASCULAR Hepatobiliary: Arterial phase imaging only. Increased hepatic attenuation along the gallbladder fossa, posterior right liver, and right inferior liver margin may represent vascular shunting or transient hepatic attenuation defects. Limited assessment by single phase arterial imaging. No definite focal abnormality. Cholelithiasis again noted. Gallbladder nondistended. Common bile duct nondilated. No biliary obstruction pattern. Pancreas: No focal pancreatic abnormality or surrounding inflammatory process. Pancreatic duct nondilated. Spleen: Normal in size without focal abnormality. Adrenals/Urinary Tract: Normal adrenal glands. Chronic left renal atrophy suggesting chronic renal vascular disease.  No focal renal abnormality. No acute process or obstruction. Ureters are symmetric and decompressed. No hydroureter. Bladder unremarkable. Stomach/Bowel: Negative for bowel obstruction, significant dilatation, ileus, or free air. Normal appendix in the right hemipelvis. Postop changes of the distal colon. No free fluid, fluid collection, hemorrhage, hematoma, abscess or ascites. No peritoneal nodularity. Lymphatic: No abdominopelvic bulky adenopathy. Reproductive: No significant finding by CT. Other: No abdominal wall hernia or abnormality. No abdominopelvic ascites. Musculoskeletal: Postop changes in the left inguinal region. No recurrent hernia. Stable osteopenia and degenerative changes. No acute osseous finding. Review of the MIP images confirms the above findings. 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 Negative for acute dissection. Recommend follow-up ultrasound every 2 years. This recommendation follows ACR consensus guidelines: White Paper of the ACR Incidental Findings Committee II on  Vascular Findings. J Am Coll Radiol 2013; 10:789-794. Negative for significant acute pulmonary embolus. Stable postop changes of the right hemithorax from right lower lobectomy with similar right inferior lung bandlike and nodular pleuroparenchymal scarring as well as right effusion with diffuse right pleural thickening. Continued slight progression of right and left paratracheal adenopathy remaining indeterminate. This still could be infectious/inflammatory or reactive. Consider follow-up surveillance PET scan for further evaluation. Cholelithiasis No other acute intra-abdominal or pelvic finding. Electronically Signed   By: Jerilynn Mages.  Shick M.D.   On: 06/30/2021 09:26   US Abdomen Limited RUQ (LIVER/GB)  Result Date: 06/30/2021 CLINICAL DATA:  Right-sided abdominal pain EXAM: ULTRASOUND ABDOMEN LIMITED RIGHT UPPER QUADRANT COMPARISON:  None. FINDINGS: Gallbladder: No gallstones or wall thickening  visualized. No sonographic Murphy sign noted by sonographer. Common bile duct: Diameter: 9 mm, possibly normal for age. Where visualized, no filling defect. Liver: No focal lesion identified. Within normal limits in parenchymal echogenicity. Portal vein is patent on color Doppler imaging with normal direction of blood flow towards the liver. IMPRESSION: 1. No cholecystitis. 2. Upper normal common bile duct diameter, please correlate with pending liver labs. 3. There was a gallstone by PET-CT 02/08/2021 which is not visualized today. Electronically Signed   By: Jorje Guild M.D.   On: 06/30/2021 05:35     EKG: Independently reviewed, with result as described above.    Assessment/Plan   Troy Mustard [name] is a 80 y.o. male with medical history significant for COPD, paroxysmal atrial fibrillation chronically anticoagulated on Eliquis, chronic diastolic heart failure, stage IIIa chronic kidney disease with baseline creatinine 1.4-1.7, hypertension, who is admitted to [facility] on 07/01/2021 with acute hypoxic respiratory failure in the setting of severe covid-19 infection after presenting from home to [facility] ED complaining of shortness of breath.    Principal Problem:   COVID-19 virus infection Active Problems:   PAF (paroxysmal atrial fibrillation) (HCC)   Acute respiratory failure with hypoxia (HCC)   SOB (shortness of breath)   AKI (acute kidney injury) ([place])   Hypomagnesemia   COPD with acute exacerbation (HCC)   Hypokalemia   Elevated troponin   Hyperlipidemia     #) Severe COVID-19 infection: diagnosis on the basis of: 8 day duration of shortness of breath associated with new onset productive cough, subjective fever, with positive outpatient COVID-19 test on 06/26/2021, further confirmed via COVID-19 PCR positive finding performed at [facility] long ED on 06/30/2021, with evidence of increased work of breathing upon presentation. Additionally, in the context of no known baseline  supplemental O2 requirements, the patient is requiring 4 L nasal cannula in order to maintain O2 sats greater than or equal to 94%. In setting of this acute hypoxia, criteria are met from patient's COVID-19 infection to be considered severe in nature. Consequently, there is a Grade 2c rec for initiation of systemic corticosteroids, which is further supported by treatment guidance recommendations from [place]'s Covid Treatment Guidelines.   Of note the patient has received a 5 day course of Monupiravir, with interval progression in his associated shortness of breath. In this setting, and given onset of respiratory symptoms starting greater than 7 days ago, there does not appear to be an indication for initiation of remdesivir at this time.   Will closely monitor ensuing degree of hypoxia as well as associated trend in supplemental oxygen requirements. Does not appear to me indications for initiation of Tocilizumab at this time, as further described below.  He has a documented history of  COPD, and presentation appears to be associated with an acute COPD exacerbation as a consequence of his severe COVID-19 infection, as further detailed below.  The patient conveys that he received the initial 2 doses of the COVID-19 vaccine followed by 2 boosters, with the second booster occurring sometime in the last 4 to 6 months. No h/o diabetes.  Of note, presenting chest x-ray showed stable right pleural effusion without evidence of acute cardiopulmonary process. Will check and trend general inflammatory markers, as further indicated below.  Of note, in the absence of leukocytosis or fever, criteria not currently met for sepsis.    Plan: Airborne and contact precautions. Monitor continuous pulse oximetry and monitor on telemetry. prn supplemental O2 to maintain O2 sats greater than or equal to 94%. Proning protocol initiated. PRN albuterol inhaler. Scheduled combivent inhaler q6H. PRN acetaminophen for fever. Start  solumedrol 45 mg IV BID, consistent with dose of 0.5 milligrams per kilogram body weight. Refrain from initiation of remdesivir, as above. Check and trend inflammatory markers (fibrinogen, d dimer or fibrin derivatives, crp, ferritin, LDH).  Supplementation of serum magnesium, as indicated below, with repeat serum mag in the morning.  Check serum phosphorus level. Check CMP and CBC in the morning. Check blood gas . Flutter valve and incentive spirometry. If rapid progression of supplemental oxygen demand, development of need for high flow O2, or worsening hypoxemia with CRP > 10, would consider initiation of Tocilizumab at that point. Check procalcitonin, which, if non-elevated in the context of the pro inflammatory state associated with COVID-19, would provide a high degree of negative predictive value that would further decrease the likelihood of any contribution from bacterial pneumonia.     #) Acute hypoxic respiratory failure: in the context of no known baseline supplemental oxygen requirements, presenting O2 sat noted to be in the mid 80s on room air, subsequently improving to the mid 90s on 4 L nasal cannula.[name] Appears to be on the basis of COVID-19 infection, as above, with likely secondary contribution from resultant acute COPD exacerbation. ACS is felt to be less likely at this time in the absence of any recent chest pain and presenting EKG showed no evidence of acute ischemic changes.  Mildly elevated initial high-sensitivity troponin I felt to represent elevation of the basis of type II supply demand mismatch as a consequence of corresponding diminished oxygen delivery capacity d/t acute hypoxic respiratory failure, as opposed to representing ACS.  No clinical or radiographic evidence of suggest acutely decompensated heart failure at this time, rather the patient appears clinically dehydrated, as further detailed below. While there is increased risk for acute PE in the setting of COVID-19 infection,  clinically, this appears to be less likely at this time, particularly given the patient's good compliance with chronic anticoagulation on Eliquis in the setting of paroxysmal atrial fibrillation. If rapid progression of supplemental oxygen demand or if development of need for high flow O2, would consider initiation of Tocilizumab at that point.   Plan: further evaluation and management of presenting COVID-19 infection, as above, including monitoring of continuous pulse oximetry with prn supplemental O2 to maintain O2 sats greater than or equal to 94%. monitor on telemetry. Trending of inflammatory markers, as above. Check CMP and CBC in the morning.  Repeat serum Mg and check Phos levels. Check blood gas, as above. Flutter valve and incentive spirometry. PRN albuterol inhaler. Scheduled combivent inhaler q6H. initiation of solumedrol, as above.  Continue to trend serial troponin.       #)  Acute kidney injury superimposed on stage III chronic kidney disease: In the setting of a documented history of CKD 3 a with baseline creatinine range of 1.4-1.7, presenting creatinine noted to be 2.12.  Appears well.  In the setting of diminished perfusion as a consequence of diminished oxygen delivery capacity corresponding with presenting acute hypoxic respiratory failure, as above, in addition to prerenal influence from dehydration given recent increase in GI losses as well as concomitant decline in oral intake over the last few days.  Potential pharmacologic exacerbation in the setting of home losartan, dehydration further worsened with continuation of home Lasix.  We will provide gentle IV fluids overnight, with close monitoring for evidence of interval development of volume overload given the patient's history of chronic diastolic heart failure.   Plan: Reduce rate of lactated Ringer's from 125 cc/h to 100 cc/h, and set duration of 10 hours.  Monitor strict I's and O's and daily weights.  Tempt avoid nephrotoxic  agents.  Check urinalysis with microscopy.  Add on random urine sodium as well as random urine creatinine.  Hold home losartan and Lasix for now.  Repeat CMP in the morning.  Further evaluation management of presenting acute hypoxic respiratory failure in the setting of severe COVID-19 infection, as above.      #) Elevated troponin: mildly elevated initial troponin of 163.  Suspect that this mildly elevated troponin is on the basis of supply demand mismatch in the setting of diminished oxygen delivery capacity presenting from acute hypoxic respiratory failure due to okay so severe COVID-19 infection, as opposed to representing a type I process due to acute plaque rupture. Additionally, relative decline in renal clearance of troponin in the setting of AKI is likely also contributing to presenting troponin elevation. EKG shows no evidence of acute ischemic changes, including no evidence of STEMI, and CXR showed no acute CP process, including no evidence of pneumothorax.  Additionally, presentation is not associated with any CP.  Overall, ACS is felt to be less likely relative to type 2 supply demand mismatch, as above, but will closely monitor on telemetry overnight while treating suspected underlying acute Evoxac respiratory failure due to severe COVID-19 infection, as further described above.    Plan: Continue to trend troponin overnight. Monitor on telemetry. PRN EKG for development of chest pain.  Supplementation of serum magnesium level, as further detailed below.  Repeat CMP and serum mag level in the morning. Repeat CBC in the AM. Additional evaluation and management of presenting acute hypoxic respiratory failure due to severe COVID-19 infection, as suspected driving force behind mildly elevated troponin, as above.  Resume home high intensity rosuvastatin, with first dose now.  Continue home beta-blocker.  In the setting of presenting AKI, will hold home losartan for now.  Echocardiogram ordered for  the morning, including to assess for interval focal wall motion abnormalities.     #) Hypomagnesemia: Presenting serum magnesium level found to be less than 0.5.   Plan: Continue magnesium sulfate 2 g IV every 2 hours initiated in the ED.  With repeat serum magnesium level ordered to be checked following completion of these 2 g of IV mag.  Monitor on telemetry.      #) Acute COPD exacerbation: In the setting of a documented history of COPD, presentation appears to be consistent with a secondary acute COPD exacerbation resulting from presenting severe COVID-19 infection, as further detailed above.  Will treat with systemic corticosteroids as well as scheduled and as needed breathing treatments as outlined  with management of severe COVID-19 infection.  Patient confirms that he is a former smoker, having completely quit smoking in 2015.  Plan: Solumedrol, as above.  Scheduled Combivent inhaler.  As needed albuterol inhaler.  Monitor on telemetry.  Monitor continuous pulse oximetry.  Check serum phosphorus level.  Check blood gas, as above.  Holding home Symbicort for now.     #) Hypokalemia: Presenting labs reflect mildly depressed serum potassium level 3.2.  However, the context of AKI on CKD 3A, will refrain from aggressive serum potassium supplementation at this time in order to reduce risk for development of hyperkalemia should renal function continue to worsen.  Plan: Monitor on telemetry.  Repeat CMP in the morning.  Further evaluation management of concomitant presenting hypomagnesemia, as further detailed above.  Repeat serum magnesium level in the morning.       #) Paroxysmal atrial fibrillation: Documented history of such. In the setting of a CHA2DS2-VASc score of 7, there is an indication for the patient to be on chronic anticoagulation for thromboembolic prophylaxis. Consistent with this, the patient is chronically anticoagulated on Eliquis. Home AV nodal blocking regimen:  Metoprolol succinate.  Most recent echocardiogram occurred in April 2021, as further detailed above. Presenting EKG shows sinus rhythm without evidence of acute ischemic changes..   Plan: monitor strict I's & O's and daily weights. Repeat BMP and CBC in the morning.  Further evaluation management of hypomagnesemia, as above.  Repeat serum magnesium in the morning.  Continue home metoprolol succinate.  Continue home Eliquis.  Monitor on telemetry.      #) Chronic diastolic heart failure: documented history of such, with most recent echocardiogram performed in April 2021, as further detailed above.  No clinical evidence to suggest acutely decompensated heart failure at this time, including present chest x-ray showing no evidence of acute cardiopulmonary process.  Patient's home diuretic regimen reportedly consists of the following: Lasix 60 mg p.o. twice daily.  However, there is a new clinical evidence for dehydration given recent increase in GI losses as well as concomitant reduction in oral intake over the last several days, will hold home Lasix for now while providing gentle IV fluids overnight, and closely monitoring for ensuing development of evidence for volume overload. Home cardiac medications also include the following: Toprol-XL and losartan.   Plan: monitor strict I's & O's and daily weights. Repeat BMP in the morning.  Recheck serum magnesium level in the morning.  Hold home Lasix for now, as above.  In the setting of acute kidney injury, will also hold him losartan for now.  Continue home beta-blocker.        #) Hyperlipidemia: On high intensity rosuvastatin as an outpatient.    Plan: Continue home statin.     DVT prophylaxis: Continue home Eliquis Code Status: Full code Family Communication: Patient's case was discussed with his daughter, who is present at bedside Disposition Plan: Per Rounding Team Consults called: none  Admission status: Inpatient; PCU   Of note, this  patient was added by me to the following Admit List/Treatment Team:  mcadmits.   Of note, the Adult Admission Order Set (Multimorbid order set) was used by me in the admission process for this patient.  PLEASE NOTE THAT DRAGON DICTATION SOFTWARE WAS USED IN THE CONSTRUCTION OF THIS NOTE.   Rhetta Mura DO [facility] Pager [phone] From [place]   07/01/2021, 9:01 PM

## 2021-07-01 NOTE — ED Notes (Addendum)
EDP made aware of magnesium <0.5

## 2021-07-02 ENCOUNTER — Inpatient Hospital Stay (HOSPITAL_COMMUNITY): Payer: PPO

## 2021-07-02 DIAGNOSIS — R0602 Shortness of breath: Secondary | ICD-10-CM | POA: Diagnosis not present

## 2021-07-02 DIAGNOSIS — J441 Chronic obstructive pulmonary disease with (acute) exacerbation: Secondary | ICD-10-CM | POA: Diagnosis not present

## 2021-07-02 DIAGNOSIS — R7989 Other specified abnormal findings of blood chemistry: Secondary | ICD-10-CM

## 2021-07-02 DIAGNOSIS — U071 COVID-19: Secondary | ICD-10-CM

## 2021-07-02 DIAGNOSIS — N179 Acute kidney failure, unspecified: Secondary | ICD-10-CM | POA: Diagnosis present

## 2021-07-02 DIAGNOSIS — R778 Other specified abnormalities of plasma proteins: Secondary | ICD-10-CM | POA: Diagnosis present

## 2021-07-02 DIAGNOSIS — E876 Hypokalemia: Secondary | ICD-10-CM | POA: Diagnosis present

## 2021-07-02 DIAGNOSIS — E785 Hyperlipidemia, unspecified: Secondary | ICD-10-CM | POA: Diagnosis present

## 2021-07-02 DIAGNOSIS — J9601 Acute respiratory failure with hypoxia: Secondary | ICD-10-CM | POA: Diagnosis not present

## 2021-07-02 LAB — TROPONIN I (HIGH SENSITIVITY): Troponin I (High Sensitivity): 96 ng/L — ABNORMAL HIGH (ref <18)

## 2021-07-02 LAB — ECHOCARDIOGRAM LIMITED
Height: 73 in
S' Lateral: 2.7 cm
Weight: 3136 oz

## 2021-07-02 LAB — CBC WITH DIFFERENTIAL/PLATELET
Abs Immature Granulocytes: 1.38 10*3/uL — ABNORMAL HIGH (ref 0.00–0.07)
Basophils Absolute: 0 10*3/uL (ref 0.0–0.1)
Basophils Relative: 0 %
Eosinophils Absolute: 0.3 10*3/uL (ref 0.0–0.5)
Eosinophils Relative: 2 %
HCT: 36.4 % — ABNORMAL LOW (ref 39.0–52.0)
Hemoglobin: 12.1 g/dL — ABNORMAL LOW (ref 13.0–17.0)
Immature Granulocytes: 10 %
Lymphocytes Relative: 2 %
Lymphs Abs: 0.3 10*3/uL — ABNORMAL LOW (ref 0.7–4.0)
MCH: 31.1 pg (ref 26.0–34.0)
MCHC: 33.2 g/dL (ref 30.0–36.0)
MCV: 93.6 fL (ref 80.0–100.0)
Monocytes Absolute: 0.3 10*3/uL (ref 0.1–1.0)
Monocytes Relative: 2 %
Neutro Abs: 10.9 10*3/uL — ABNORMAL HIGH (ref 1.7–7.7)
Neutrophils Relative %: 84 %
Platelets: 193 10*3/uL (ref 150–400)
RBC: 3.89 MIL/uL — ABNORMAL LOW (ref 4.22–5.81)
RDW: 15 % (ref 11.5–15.5)
WBC: 13.2 10*3/uL — ABNORMAL HIGH (ref 4.0–10.5)
nRBC: 0 % (ref 0.0–0.2)

## 2021-07-02 LAB — COMPREHENSIVE METABOLIC PANEL
ALT: 21 U/L (ref 0–44)
AST: 31 U/L (ref 15–41)
Albumin: 2.7 g/dL — ABNORMAL LOW (ref 3.5–5.0)
Alkaline Phosphatase: 84 U/L (ref 38–126)
Anion gap: 12 (ref 5–15)
BUN: 28 mg/dL — ABNORMAL HIGH (ref 8–23)
CO2: 23 mmol/L (ref 22–32)
Calcium: 8.5 mg/dL — ABNORMAL LOW (ref 8.9–10.3)
Chloride: 102 mmol/L (ref 98–111)
Creatinine, Ser: 1.84 mg/dL — ABNORMAL HIGH (ref 0.61–1.24)
GFR, Estimated: 37 mL/min — ABNORMAL LOW (ref >60)
Glucose, Bld: 174 mg/dL — ABNORMAL HIGH (ref 70–99)
Potassium: 4 mmol/L (ref 3.5–5.1)
Sodium: 137 mmol/L (ref 135–145)
Total Bilirubin: 1.5 mg/dL — ABNORMAL HIGH (ref 0.3–1.2)
Total Protein: 6.2 g/dL — ABNORMAL LOW (ref 6.5–8.1)

## 2021-07-02 LAB — FIBRINOGEN: Fibrinogen: >800 mg/dL — ABNORMAL HIGH (ref 210–475)

## 2021-07-02 LAB — MAGNESIUM: Magnesium: 2.9 mg/dL — ABNORMAL HIGH (ref 1.7–2.4)

## 2021-07-02 LAB — FERRITIN
Ferritin: 507 ng/mL — ABNORMAL HIGH (ref 24–336)
Ferritin: 615 ng/mL — ABNORMAL HIGH (ref 24–336)

## 2021-07-02 LAB — PHOSPHORUS: Phosphorus: 5.3 mg/dL — ABNORMAL HIGH (ref 2.5–4.6)

## 2021-07-02 LAB — D-DIMER, QUANTITATIVE: D-Dimer, Quant: 3.45 ug/mL-FEU — ABNORMAL HIGH (ref 0.00–0.50)

## 2021-07-02 LAB — LACTATE DEHYDROGENASE: LDH: 173 U/L (ref 98–192)

## 2021-07-02 LAB — C-REACTIVE PROTEIN
CRP: 26.1 mg/dL — ABNORMAL HIGH (ref <1.0)
CRP: 28.2 mg/dL — ABNORMAL HIGH (ref <1.0)

## 2021-07-02 LAB — PROCALCITONIN: Procalcitonin: 95.74 ng/mL

## 2021-07-02 MED ORDER — SODIUM CHLORIDE 0.9 % IV SOLN
500.0000 mg | INTRAVENOUS | Status: DC
  Administered 2021-07-02 – 2021-07-06 (×5): 500 mg via INTRAVENOUS
  Filled 2021-07-02 (×5): qty 500

## 2021-07-02 MED ORDER — UMECLIDINIUM BROMIDE 62.5 MCG/INH IN AEPB
1.0000 | INHALATION_SPRAY | Freq: Every day | RESPIRATORY_TRACT | Status: DC
  Administered 2021-07-03 – 2021-07-05 (×3): 1 via RESPIRATORY_TRACT
  Filled 2021-07-02: qty 7

## 2021-07-02 MED ORDER — LEVALBUTEROL TARTRATE 45 MCG/ACT IN AERO: 2.0000 | INHALATION_SPRAY | Freq: Four times a day (QID) | RESPIRATORY_TRACT | Status: DC | PRN

## 2021-07-02 MED ORDER — METHYLPREDNISOLONE SODIUM SUCC 125 MG IJ SOLR
60.0000 mg | Freq: Two times a day (BID) | INTRAMUSCULAR | Status: DC
  Administered 2021-07-02 – 2021-07-05 (×6): 60 mg via INTRAVENOUS
  Filled 2021-07-02 (×6): qty 2

## 2021-07-02 MED ORDER — SODIUM CHLORIDE 0.9 % IV SOLN
2.0000 g | INTRAVENOUS | Status: DC
  Administered 2021-07-02 – 2021-07-05 (×4): 2 g via INTRAVENOUS
  Filled 2021-07-02 (×4): qty 20

## 2021-07-02 MED ORDER — MELATONIN 5 MG PO TABS
5.0000 mg | ORAL_TABLET | Freq: Every evening | ORAL | Status: DC | PRN
  Administered 2021-07-02 – 2021-07-05 (×4): 5 mg via ORAL
  Filled 2021-07-02 (×4): qty 1

## 2021-07-02 MED ORDER — SODIUM CHLORIDE 0.9 % IV SOLN: 2.0000 g | INTRAVENOUS | Status: DC

## 2021-07-02 MED ORDER — LEVALBUTEROL TARTRATE 45 MCG/ACT IN AERO
2.0000 | INHALATION_SPRAY | Freq: Four times a day (QID) | RESPIRATORY_TRACT | Status: DC
  Administered 2021-07-02 – 2021-07-05 (×11): 2 via RESPIRATORY_TRACT
  Filled 2021-07-02 (×2): qty 15

## 2021-07-02 MED ORDER — SODIUM CHLORIDE 0.9 % IV SOLN
2.0000 g | INTRAVENOUS | Status: DC
  Filled 2021-07-02: qty 20

## 2021-07-02 NOTE — ED Notes (Addendum)
Admitting doctor paged because pt takes melatonin PRN. See new orders. Pt sat up in bed and sheets changed because he missed the urinal. Pt denies further needs.

## 2021-07-02 NOTE — ED Notes (Signed)
Waiting for inhaler from pharmacy. Pt pulled up in bed, repositioned and given a new pillow. Denies further needs.

## 2021-07-02 NOTE — Progress Notes (Signed)
VASCULAR LAB    Bilateral lower extremity venous duplex has been performed.  See CV proc for preliminary results.   [place]ia Crance, RVT 07/02/2021, 12:25 PM

## 2021-07-02 NOTE — ED Notes (Signed)
Pt resting in bed listening to tv. NAD noted.

## 2021-07-02 NOTE — ED Notes (Signed)
Pt ate 60% of meal tray and cleaned up afterwards. 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 Side rails up and call light within reach. Pt resituated in bed and given new pillows. Lights turned off. Pt denies further needs.

## 2021-07-02 NOTE — Progress Notes (Signed)
PROGRESS NOTE        PATIENT DETAILS Name: Troy Suhr [name] Age: 80 y.o. Sex: male Date of Birth: [birth year] Admit Date: 07/01/2021 Admitting Physician Rhetta Mura, DO ZCH:YIFOY, Claudina Lick, MD  Brief Narrative: Patient is a 80 y.o. male with history of COPD, PAF, HFpEF, CKD stage IIIa-who was diagnosed with COVID-19 on 9/26 (home test)-presented with shortness of breath-felt to be due to COPD exacerbation in the setting of COVID-19 infection.  See below for further details.  Subjective: Feels better than yesterday-less wheezing-feels less tight.  Objective:  Vitals: Blood pressure 123/65, pulse 71, temperature 98.6 F (37 C), temperature source Oral, resp. rate 18, height 6\' 1"  (1.854 m), weight 88.9 kg, SpO2 98 %.   Exam: Gen Exam:Alert awake-not in any distress HEENT:atraumatic, normocephalic Chest: Moving air but diminished air entry bilaterally-some scattered rhonchi. CVS:S1S2 regular Abdomen:soft non tender, non distended Extremities:no edema Neurology: Non focal Skin: no rash  Pertinent Labs/Radiology: WBC: 13.2 Hb: 12.1 Na: 137 K: 4.0 Creatinine: 1.84 Procalcitonin: 95.74  Recent Labs    07/01/21 1822 07/01/21 2113 07/02/21 0611  DDIMER 4.83*  --  3.45*  FERRITIN  --   --  507*  LDH  --  185 173  CRP  --   --  26.1*   Assessment and plan: Acute hypoxic respiratory failure due to COPD exacerbation: Improving-less wheezing-moving air well-continue steroids/bronchodilators.  COPD exacerbation likely provoked by COVID-19 infection.  ?  Pneumonia: Significant elevation in procalcitonin-recent imaging with no significant PNA-we will start empiric Rocephin/Zithromax-and plan on repeating imaging tomorrow morning.  Have ordered blood cultures.  COVID-19 infection: Completed a 5-day course of Molnuparivir prior to this hospitalization-since no significant infiltrates on imaging-do not think patient requires COVID-19 treatment.   On steroids already for COPD exacerbation.  Elevated D-dimer: Already on Eliquis-hypoxia improving-check lower extremity Doppler.  Recent CTA chest on 9/29 was negative for PE.  AKI on CKD stage IIIa: AKI hemodynamically mediated-improving with supportive care.  Minimal troponin elevation: Likely demand ischemia/AKI-trend is flat-not suggestive of ACS.  Supportive care.  PAF: Maintaining sinus rhythm-continue telemetry monitoring-on metoprolol and Eliquis.  HFpEF: Euvolemic on exam-continue metoprolol-resume Lasix in the next few days.  HTN: BP stable-continue metoprolol-resume rest of his antihypertensives over the next few days.  HLD: Continue statin  History of carotid artery stenosis-s/p right CEA  History of PAD-s/p fem pop bypass in 11/16  History of amiodarone induced thyrotoxicosis: Follows with endocrinology-no longer on methimazole-recent thyroid function tests were normal.  History of squamous cell cancer of the lung-s/p right lower lobe lobectomy-chronic right pleural effusion  History of sigmoid adenocarcinoma-s/p surgical resection-appears to be in remission  History of lung nodule: Follows with Pulmonology in the outpatient setting  Procedures: None Consults: None DVT Prophylaxis: Eliquis Code Status:Full code  Family Communication: Daugher-[phone] updated over the phone 10/2  Time spent: 35 minutes-Greater than 50% of this time was spent in counseling, explanation of diagnosis, planning of further management, and coordination of care.  Diet: Diet Order             Diet regular Room service appropriate? Yes; Fluid consistency: Thin  Diet effective now                      Disposition Plan: Status is: Inpatient  Remains inpatient appropriate because:Inpatient level of care appropriate due  to severity of illness  Dispo: The patient is from: Home              Anticipated d/c is to: Home              Patient currently is not medically  stable to d/c.   Difficult to place patient No    Barriers to Discharge: On oxygen/IV antibiotics-not at baseline  Antimicrobial agents: Anti-infectives (From admission, onward)    None        MEDICATIONS: Scheduled Meds:  apixaban  5 mg Oral BID   brinzolamide  1 drop Both Eyes BID   And   brimonidine  1 drop Both Eyes BID   Ipratropium-Albuterol  1 puff Inhalation Q6H WA   latanoprost  1 drop Both Eyes QHS   methylPREDNISolone (SOLU-MEDROL) injection  45 mg Intravenous Q12H   metoprolol succinate  12.5 mg Oral Daily   montelukast  10 mg Oral QHS   pantoprazole  40 mg Oral Daily   rosuvastatin  40 mg Oral Daily   Continuous Infusions: PRN Meds:.acetaminophen **OR** acetaminophen, albuterol, ondansetron (ZOFRAN) IV   I have personally reviewed following labs and imaging studies  LABORATORY DATA: CBC: Recent Labs  Lab 06/30/21 0454 07/01/21 1823 07/01/21 1839 07/02/21 0611  WBC 6.1 4.9  --  13.2*  NEUTROABS 5.4 4.7  --  10.9*  HGB 13.8 12.8* 12.6* 12.1*  HCT 41.7 39.3 37.0* 36.4*  MCV 93.7 93.6  --  93.6  PLT 234 192  --  341    Basic Metabolic Panel: Recent Labs  Lab 06/30/21 0454 07/01/21 1822 07/01/21 1823 07/01/21 1839 07/02/21 0611  NA 137  --  136 138 137  K 3.7  --  3.2* 3.3* 4.0  CL 105  --  101  --  102  CO2 23  --  21*  --  23  GLUCOSE 151*  --  136*  --  174*  BUN 23  --  26*  --  28*  CREATININE 1.39*  --  2.12*  --  1.84*  CALCIUM 8.7*  --  8.6*  --  8.5*  MG  --  <0.5*  --   --  2.9*  PHOS  --   --   --   --  5.3*    GFR: Estimated Creatinine Clearance: 36.2 mL/min (A) (by C-G formula based on SCr of 1.84 mg/dL (H)).  Liver Function Tests: Recent Labs  Lab 06/30/21 0454 07/01/21 1823 07/02/21 0611  AST 15 27 31   ALT 13 20 21   ALKPHOS 67 147* 84  BILITOT 0.8 1.8* 1.5*  PROT 6.9 6.5 6.2*  ALBUMIN 3.5 3.0* 2.7*   Recent Labs  Lab 06/30/21 0454  LIPASE 26   No results for input(s): AMMONIA in the last 168  hours.  Coagulation Profile: Recent Labs  Lab 07/01/21 1822  INR 1.8*    Cardiac Enzymes: No results for input(s): CKTOTAL, CKMB, CKMBINDEX, TROPONINI in the last 168 hours.  BNP (last 3 results) No results for input(s): PROBNP in the last 8760 hours.  Lipid Profile: No results for input(s): CHOL, HDL, LDLCALC, TRIG, CHOLHDL, LDLDIRECT in the last 72 hours.  Thyroid Function Tests: No results for input(s): TSH, T4TOTAL, FREET4, T3FREE, THYROIDAB in the last 72 hours.  Anemia Panel: Recent Labs    07/02/21 0611  FERRITIN 507*    Urine analysis:    Component Value Date/Time   COLORURINE STRAW (A) 06/30/2021 0454   APPEARANCEUR CLEAR 06/30/2021 0454  LABSPEC 1.016 06/30/2021 0454   PHURINE 7.0 06/30/2021 0454   GLUCOSEU >=500 (A) 06/30/2021 0454   HGBUR NEGATIVE 06/30/2021 0454   BILIRUBINUR NEGATIVE 06/30/2021 0454   KETONESUR 5 (A) 06/30/2021 0454   PROTEINUR NEGATIVE 06/30/2021 0454   UROBILINOGEN 1.0 07/10/2013 1500   NITRITE NEGATIVE 06/30/2021 0454   LEUKOCYTESUR NEGATIVE 06/30/2021 0454    Sepsis Labs: Lactic Acid, Venous No results found for: LATICACIDVEN  MICROBIOLOGY: Recent Results (from the past 240 hour(s))  Resp Panel by RT-PCR (Flu A&B, Covid) Nasopharyngeal Swab     Status: Abnormal   Collection Time: 06/30/21  4:54 AM   Specimen: Nasopharyngeal Swab; Nasopharyngeal(NP) swabs in vial transport medium  Result Value Ref Range Status   SARS Coronavirus 2 by RT PCR POSITIVE (A) NEGATIVE Final    Comment: RESULT CALLED TO, READ BACK BY AND VERIFIED WITH: WHEATLEY, T. RN ON 06/30/21 @ 0614 BY MECIAL J. (NOTE) SARS-CoV-2 target nucleic acids are DETECTED.  The SARS-CoV-2 RNA is generally detectable in upper respiratory specimens during the acute phase of infection. Positive results are indicative of the presence of the identified virus, but do not rule out bacterial infection or co-infection with other pathogens not detected by the test.  Clinical correlation with patient history and other diagnostic information is necessary to determine patient infection status. The expected result is Negative.  Fact Sheet for Patients: EntrepreneurPulse.com.au  Fact Sheet for Healthcare Providers: IncredibleEmployment.be  This test is not yet approved or cleared by the [place] FDA and  has been authorized for detection and/or diagnosis of SARS-CoV-2 by FDA under an Emergency Use Authorization (EUA).  This EUA will remain in effect (meaning th is test can be used) for the duration of  the COVID-19 declaration under Section 564(b)(1) of the Act, 21 U.S.C. section 360bbb-3(b)(1), unless the authorization is terminated or revoked sooner.     Influenza A by PCR NEGATIVE NEGATIVE Final   Influenza B by PCR NEGATIVE NEGATIVE Final    Comment: (NOTE) The Xpert Xpress SARS-CoV-2/FLU/RSV plus assay is intended as an aid in the diagnosis of influenza from Nasopharyngeal swab specimens and should not be used as a sole basis for treatment. Nasal washings and aspirates are unacceptable for Xpert Xpress SARS-CoV-2/FLU/RSV testing.  Fact Sheet for Patients: EntrepreneurPulse.com.au  Fact Sheet for Healthcare Providers: IncredibleEmployment.be  This test is not yet approved or cleared by the [place] FDA and has been authorized for detection and/or diagnosis of SARS-CoV-2 by FDA under an Emergency Use Authorization (EUA). This EUA will remain in effect (meaning this test can be used) for the duration of the COVID-19 declaration under Section 564(b)(1) of the Act, 21 U.S.C. section 360bbb-3(b)(1), unless the authorization is terminated or revoked.  Performed at [facility], [place] [address], [place], [place] [address]     RADIOLOGY STUDIES/RESULTS: DG Chest Port 1 View  Result Date: 07/01/2021 CLINICAL DATA:  COVID.  Shortness of  breath. EXAM: PORTABLE CHEST 1 VIEW COMPARISON:  CT angiogram chest and chest x-ray 06/30/2021. FINDINGS: Small right pleural effusion is unchanged. There is linear atelectasis or scarring in the right lower lung similar to the prior study. There is no new focal lung infiltrate. There is no pneumothorax. The cardiomediastinal silhouette is within normal limits for projection. No acute fractures are seen. IMPRESSION: 1. Stable right pleural effusion with right lower lung scarring. 2. No new lung infiltrate. Electronically Signed   By: Ronney Asters M.D.   On: 07/01/2021 18:46  LOS: 1 day   Oren Binet, MD  [facility]    To contact the attending provider between 7A-7P or the covering provider during after hours 7P-7A, please log into the web site [email or web address] and access using universal [place] password for that web site. If you do not have the password, please call the hospital operator.  07/02/2021, 9:14 AM

## 2021-07-02 NOTE — ED Notes (Signed)
I introduced myself to pt and SO. Pt on 2L [place]. Pt AxO x4 with a GCS of 15. Given a fan because feels hot (no way to control temperature in room) and feels like it will make his breathing worse. Pt denies pain. Pt resting in bed. Side rails up and call light within reach. Pt encouraged to drink what he can keep down since he's had trouble eating. Denies further needs.

## 2021-07-02 NOTE — ED Notes (Signed)
Placed Breakfast order

## 2021-07-02 NOTE — Progress Notes (Signed)
*  PRELIMINARY RESULTS* Echocardiogram Limited 2-D Echocardiogram  has been performed. Patient had extremely limited and difficult windows.  Troy [name] 07/02/2021, 4:58 PM

## 2021-07-03 ENCOUNTER — Telehealth: Payer: Self-pay | Admitting: Internal Medicine

## 2021-07-03 ENCOUNTER — Ambulatory Visit: Payer: PPO

## 2021-07-03 ENCOUNTER — Inpatient Hospital Stay (HOSPITAL_COMMUNITY): Payer: PPO

## 2021-07-03 DIAGNOSIS — U071 COVID-19: Secondary | ICD-10-CM | POA: Diagnosis not present

## 2021-07-03 DIAGNOSIS — J441 Chronic obstructive pulmonary disease with (acute) exacerbation: Secondary | ICD-10-CM | POA: Diagnosis not present

## 2021-07-03 DIAGNOSIS — N179 Acute kidney failure, unspecified: Secondary | ICD-10-CM | POA: Diagnosis not present

## 2021-07-03 DIAGNOSIS — J9601 Acute respiratory failure with hypoxia: Secondary | ICD-10-CM | POA: Diagnosis not present

## 2021-07-03 LAB — COMPREHENSIVE METABOLIC PANEL
ALT: 22 U/L (ref 0–44)
AST: 37 U/L (ref 15–41)
Albumin: 2.7 g/dL — ABNORMAL LOW (ref 3.5–5.0)
Alkaline Phosphatase: 81 U/L (ref 38–126)
Anion gap: 11 (ref 5–15)
BUN: 36 mg/dL — ABNORMAL HIGH (ref 8–23)
CO2: 24 mmol/L (ref 22–32)
Calcium: 8.2 mg/dL — ABNORMAL LOW (ref 8.9–10.3)
Chloride: 99 mmol/L (ref 98–111)
Creatinine, Ser: 1.47 mg/dL — ABNORMAL HIGH (ref 0.61–1.24)
GFR, Estimated: 48 mL/min — ABNORMAL LOW (ref >60)
Glucose, Bld: 162 mg/dL — ABNORMAL HIGH (ref 70–99)
Potassium: 3.9 mmol/L (ref 3.5–5.1)
Sodium: 134 mmol/L — ABNORMAL LOW (ref 135–145)
Total Bilirubin: 0.8 mg/dL (ref 0.3–1.2)
Total Protein: 6.7 g/dL (ref 6.5–8.1)

## 2021-07-03 LAB — CBC
HCT: 36 % — ABNORMAL LOW (ref 39.0–52.0)
Hemoglobin: 12 g/dL — ABNORMAL LOW (ref 13.0–17.0)
MCH: 31.5 pg (ref 26.0–34.0)
MCHC: 33.3 g/dL (ref 30.0–36.0)
MCV: 94.5 fL (ref 80.0–100.0)
Platelets: 205 10*3/uL (ref 150–400)
RBC: 3.81 MIL/uL — ABNORMAL LOW (ref 4.22–5.81)
RDW: 14.9 % (ref 11.5–15.5)
WBC: 11.9 10*3/uL — ABNORMAL HIGH (ref 4.0–10.5)
nRBC: 0 % (ref 0.0–0.2)

## 2021-07-03 LAB — D-DIMER, QUANTITATIVE: D-Dimer, Quant: 2.64 ug/mL-FEU — ABNORMAL HIGH (ref 0.00–0.50)

## 2021-07-03 LAB — PROCALCITONIN: Procalcitonin: 46.88 ng/mL

## 2021-07-03 LAB — C-REACTIVE PROTEIN: CRP: 22.2 mg/dL — ABNORMAL HIGH (ref <1.0)

## 2021-07-03 MED ORDER — INFLUENZA VAC A&B SA ADJ QUAD 0.5 ML IM PRSY
0.5000 mL | PREFILLED_SYRINGE | INTRAMUSCULAR | Status: DC
  Filled 2021-07-03: qty 0.5

## 2021-07-03 MED ORDER — FUROSEMIDE 40 MG PO TABS
60.0000 mg | ORAL_TABLET | Freq: Every day | ORAL | Status: DC
  Administered 2021-07-03 – 2021-07-06 (×4): 60 mg via ORAL
  Filled 2021-07-03: qty 3
  Filled 2021-07-03 (×3): qty 1

## 2021-07-03 MED ORDER — AMLODIPINE BESYLATE 5 MG PO TABS
5.0000 mg | ORAL_TABLET | Freq: Every day | ORAL | Status: DC
  Administered 2021-07-03 – 2021-07-06 (×4): 5 mg via ORAL
  Filled 2021-07-03 (×4): qty 1

## 2021-07-03 NOTE — ED Notes (Signed)
Pt up to a chair to eat lunch, changed pt's bedding and checked pt, pt is dry and clean.  Pt denies pain, pt offers no complaints

## 2021-07-03 NOTE — Telephone Encounter (Signed)
[facility] FYI 07/01/2021...   Caller states her father has Troy [name]. This is day 7. He is unable to keep liquids or food down. He was seen in the ED yesterday for severe abd pain, but sent home due to not finding anything wrong. He has urinated in the past 8 hours. Patient called his daughter, she came over and found him in the bathroom, alert but on the floor after he passed out. She and her husband got him up to the bed. He is weak, but alert and can talk, but is very short of breath. Patient states he did not really pass out, but was so weak he fell down while in the bathroom  Advised to call EMS 911 now. Patient understood and went to [facility]

## 2021-07-03 NOTE — ED Notes (Signed)
Report given to [place], [place]

## 2021-07-03 NOTE — ED Notes (Signed)
Pt up to bedside commode

## 2021-07-03 NOTE — ED Notes (Signed)
Pt continues to sleep. NAD noted.

## 2021-07-03 NOTE — ED Notes (Signed)
Pt has small soft bm, cleaned pt, pt back to bed, pt denies pain, pt has sob with exertion

## 2021-07-03 NOTE — ED Notes (Signed)
Pt appears to be asleep. NAD noted.

## 2021-07-03 NOTE — Progress Notes (Addendum)
PROGRESS NOTE        PATIENT DETAILS Name: Troy Malter [name] Age: 80 y.o. Sex: male Date of Birth: 11/21/1940 Admit Date: 07/01/2021 Admitting Physician Evalee Mutton Kristeen Mans, MD AJG:OTLXB, Claudina Lick, MD  Brief Narrative: Patient is a 80 y.o. male with history of COPD, PAF, HFpEF, CKD stage IIIa-who was diagnosed with COVID-19 on 9/26 (home test)-presented with shortness of breath-felt to be due to COPD exacerbation in the setting of COVID-19 infection.  See below for further details.  Subjective: Lying comfortably in bed-thinks he feels better than yesterday.  Objective:  Vitals: Blood pressure (!) 153/78, pulse 66, temperature 98.1 F (36.7 C), temperature source Oral, resp. rate 18, height 6\' 1"  (1.854 m), weight 88.9 kg, SpO2 96 %.   Exam: Gen Exam:Alert awake-not in any distress HEENT:atraumatic, normocephalic Chest: Hardly any rhonchi today-moving air well. CVS:S1S2 regular Abdomen:soft non tender, non distended Extremities:no edema Neurology: Non focal Skin: no rash   Pertinent Labs/Radiology: WBC: 11.9 Hb: 12.0 Na: 134 K: 3.9 Creatinine: 1.47 Procalcitonin: 95.74>> 46.88  Recent Labs    07/01/21 1822 07/01/21 2113 07/02/21 0611 07/02/21 0834 07/03/21 0510  DDIMER 4.83*  --  3.45*  --  2.64*  FERRITIN  --   --  507* 615*  --   LDH  --  185 173  --   --   CRP  --   --  26.1* 28.2* 22.2*   10/2>> blood culture: No growth  9/30>>CT angio chest/abdomen: No central PE, 3.7 cm AAA-no dissection-no acute abnormalities in abdomen. 10/02>> lower extremity Doppler: No DVT. 10/02>> Echo: EF 55-60%   Assessment and plan: Acute hypoxic respiratory failure due to COPD exacerbation: Improved-hardly any wheezing today-Taper steroids-continue bronchodilators.  COPD exacerbation likely provoked by COVID-19 infection.  Continue to attempt to titrate down FiO2.    Probable right lower lobe PNA: Significant elevation in procalcitonin-probably  has small consolidation in the right lower lobe-continue empiric Rocephin/Zithromax.  Follow cultures.  COVID-19 infection: Completed a 5-day course of Molnuparivir prior to this hospitalization-since no significant infiltrates on imaging-do not think patient requires COVID-19 treatment.  On steroids already for COPD exacerbation.  Elevated D-dimer: Likely related to COVID-19 infection-CTA chest/Dopplers negative for DVT.  Continue Eliquis.   AKI on CKD stage IIIa: AKI hemodynamically mediated-improved-close to baseline.  Check electrolytes periodically.  Minimal troponin elevation: Likely demand ischemia/AKI-trend is flat-not suggestive of ACS.  Supportive care.  PAF: Maintaining sinus rhythm-continue telemetry monitoring-on metoprolol and Eliquis.  HFpEF: Euvolemic on exam-continue metoprolol-resume Lasix today.  HTN: BP on the higher side-continue metoprolol-resume/amlodipine.  Follow and adjust.  HLD: Continue statin  History of carotid artery stenosis-s/p right CEA  History of PAD-s/p fem pop bypass in 11/16  History of amiodarone induced thyrotoxicosis: Follows with endocrinology-no longer on methimazole-recent thyroid function tests were normal.  History of squamous cell cancer of the lung-s/p right lower lobe lobectomy-chronic right pleural effusion  History of sigmoid adenocarcinoma-s/p surgical resection-appears to be in remission  History of lung nodule: Follows with Pulmonology in the outpatient setting  Procedures: None Consults: None DVT Prophylaxis: Eliquis Code Status:Full code  Family Communication: Daugher-[phone] updated over the phone 10/2  Time spent: 25 minutes-Greater than 50% of this time was spent in counseling, explanation of diagnosis, planning of further management, and coordination of care.  Diet: Diet Order  Diet regular Room service appropriate? Yes; Fluid consistency: Thin  Diet effective now                       Disposition Plan: Status is: Inpatient  Remains inpatient appropriate because:Inpatient level of care appropriate due to severity of illness  Dispo: The patient is from: Home              Anticipated d/c is to: Home              Patient currently is not medically stable to d/c.   Difficult to place patient No    Barriers to Discharge: On oxygen/IV antibiotics-not at baseline  Antimicrobial agents: Anti-infectives (From admission, onward)    Start     Dose/Rate Route Frequency Ordered Stop   07/03/21 1500  cefTRIAXone (ROCEPHIN) 2 g in sodium chloride 0.9 % 100 mL IVPB  Status:  Discontinued        2 g 200 mL/hr over 30 Minutes Intravenous Every 24 hours 07/02/21 1507 07/02/21 2032   07/02/21 2100  cefTRIAXone (ROCEPHIN) 2 g in sodium chloride 0.9 % 100 mL IVPB        2 g 200 mL/hr over 30 Minutes Intravenous Every 24 hours 07/02/21 2032     07/02/21 1000  cefTRIAXone (ROCEPHIN) 2 g in sodium chloride 0.9 % 100 mL IVPB  Status:  Discontinued        2 g 200 mL/hr over 30 Minutes Intravenous Every 24 hours 07/02/21 0949 07/02/21 1507   07/02/21 0945  azithromycin (ZITHROMAX) 500 mg in sodium chloride 0.9 % 250 mL IVPB        500 mg 250 mL/hr over 60 Minutes Intravenous Every 24 hours 07/02/21 0942          MEDICATIONS: Scheduled Meds:  amLODipine  5 mg Oral Daily   apixaban  5 mg Oral BID   brinzolamide  1 drop Both Eyes BID   And   brimonidine  1 drop Both Eyes BID   furosemide  60 mg Oral Daily   latanoprost  1 drop Both Eyes QHS   levalbuterol  2 puff Inhalation Q6H   methylPREDNISolone (SOLU-MEDROL) injection  60 mg Intravenous Q12H   metoprolol succinate  12.5 mg Oral Daily   montelukast  10 mg Oral QHS   pantoprazole  40 mg Oral Daily   rosuvastatin  40 mg Oral Daily   umeclidinium bromide  1 puff Inhalation Daily   Continuous Infusions:  azithromycin Stopped (07/02/21 1139)   cefTRIAXone (ROCEPHIN)  IV Stopped (07/02/21 2229)   PRN  Meds:.acetaminophen **OR** acetaminophen, levalbuterol, melatonin, ondansetron (ZOFRAN) IV   I have personally reviewed following labs and imaging studies  LABORATORY DATA: CBC: Recent Labs  Lab 06/30/21 0454 07/01/21 1823 07/01/21 1839 07/02/21 0611 07/03/21 0510  WBC 6.1 4.9  --  13.2* 11.9*  NEUTROABS 5.4 4.7  --  10.9*  --   HGB 13.8 12.8* 12.6* 12.1* 12.0*  HCT 41.7 39.3 37.0* 36.4* 36.0*  MCV 93.7 93.6  --  93.6 94.5  PLT 234 192  --  193 400    Basic Metabolic Panel: Recent Labs  Lab 06/30/21 0454 07/01/21 1822 07/01/21 1823 07/01/21 1839 07/02/21 0611 07/03/21 0510  NA 137  --  136 138 137 134*  K 3.7  --  3.2* 3.3* 4.0 3.9  CL 105  --  101  --  102 99  CO2 23  --  21*  --  23 24  GLUCOSE 151*  --  136*  --  174* 162*  BUN 23  --  26*  --  28* 36*  CREATININE 1.39*  --  2.12*  --  1.84* 1.47*  CALCIUM 8.7*  --  8.6*  --  8.5* 8.2*  MG  --  <0.5*  --   --  2.9*  --   PHOS  --   --   --   --  5.3*  --     GFR: Estimated Creatinine Clearance: 45.3 mL/min (A) (by C-G formula based on SCr of 1.47 mg/dL (H)).  Liver Function Tests: Recent Labs  Lab 06/30/21 0454 07/01/21 1823 07/02/21 0611 07/03/21 0510  AST 15 27 31  37  ALT 13 20 21 22   ALKPHOS 67 147* 84 81  BILITOT 0.8 1.8* 1.5* 0.8  PROT 6.9 6.5 6.2* 6.7  ALBUMIN 3.5 3.0* 2.7* 2.7*   Recent Labs  Lab 06/30/21 0454  LIPASE 26   No results for input(s): AMMONIA in the last 168 hours.  Coagulation Profile: Recent Labs  Lab 07/01/21 1822  INR 1.8*    Cardiac Enzymes: No results for input(s): CKTOTAL, CKMB, CKMBINDEX, TROPONINI in the last 168 hours.  BNP (last 3 results) No results for input(s): PROBNP in the last 8760 hours.  Lipid Profile: No results for input(s): CHOL, HDL, LDLCALC, TRIG, CHOLHDL, LDLDIRECT in the last 72 hours.  Thyroid Function Tests: No results for input(s): TSH, T4TOTAL, FREET4, T3FREE, THYROIDAB in the last 72 hours.  Anemia Panel: Recent Labs     07/02/21 0611 07/02/21 0834  FERRITIN 507* 615*    Urine analysis:    Component Value Date/Time   COLORURINE STRAW (A) 06/30/2021 0454   APPEARANCEUR CLEAR 06/30/2021 0454   LABSPEC 1.016 06/30/2021 0454   PHURINE 7.0 06/30/2021 0454   GLUCOSEU >=500 (A) 06/30/2021 0454   HGBUR NEGATIVE 06/30/2021 0454   BILIRUBINUR NEGATIVE 06/30/2021 0454   KETONESUR 5 (A) 06/30/2021 0454   PROTEINUR NEGATIVE 06/30/2021 0454   UROBILINOGEN 1.0 07/10/2013 1500   NITRITE NEGATIVE 06/30/2021 0454   LEUKOCYTESUR NEGATIVE 06/30/2021 0454    Sepsis Labs: Lactic Acid, Venous No results found for: LATICACIDVEN  MICROBIOLOGY: Recent Results (from the past 240 hour(s))  Resp Panel by RT-PCR (Flu A&B, Covid) Nasopharyngeal Swab     Status: Abnormal   Collection Time: 06/30/21  4:54 AM   Specimen: Nasopharyngeal Swab; Nasopharyngeal(NP) swabs in vial transport medium  Result Value Ref Range Status   SARS Coronavirus 2 by RT PCR POSITIVE (A) NEGATIVE Final    Comment: RESULT CALLED TO, READ BACK BY AND VERIFIED WITH: WHEATLEY, T. RN ON 06/30/21 @ 0614 BY MECIAL J. (NOTE) SARS-CoV-2 target nucleic acids are DETECTED.  The SARS-CoV-2 RNA is generally detectable in upper respiratory specimens during the acute phase of infection. Positive results are indicative of the presence of the identified virus, but do not rule out bacterial infection or co-infection with other pathogens not detected by the test. Clinical correlation with patient history and other diagnostic information is necessary to determine patient infection status. The expected result is Negative.  Fact Sheet for Patients: EntrepreneurPulse.com.au  Fact Sheet for Healthcare Providers: IncredibleEmployment.be  This test is not yet approved or cleared by the [place] FDA and  has been authorized for detection and/or diagnosis of SARS-CoV-2 by FDA under an Emergency Use Authorization (EUA).   This EUA will remain in effect (meaning th is test can be used) for the duration of  the COVID-19 declaration under Section 564(b)(1) of the Act, 21 U.S.C. section 360bbb-3(b)(1), unless the authorization is terminated or revoked sooner.     Influenza A by PCR NEGATIVE NEGATIVE Final   Influenza B by PCR NEGATIVE NEGATIVE Final    Comment: (NOTE) The Xpert Xpress SARS-CoV-2/FLU/RSV plus assay is intended as an aid in the diagnosis of influenza from Nasopharyngeal swab specimens and should not be used as a sole basis for treatment. Nasal washings and aspirates are unacceptable for Xpert Xpress SARS-CoV-2/FLU/RSV testing.  Fact Sheet for Patients: EntrepreneurPulse.com.au  Fact Sheet for Healthcare Providers: IncredibleEmployment.be  This test is not yet approved or cleared by the [place] FDA and has been authorized for detection and/or diagnosis of SARS-CoV-2 by FDA under an Emergency Use Authorization (EUA). This EUA will remain in effect (meaning this test can be used) for the duration of the COVID-19 declaration under Section 564(b)(1) of the Act, 21 U.S.C. section 360bbb-3(b)(1), unless the authorization is terminated or revoked.  Performed at [facility], [place] [address], [place], [place] [address]   Culture, blood (routine x 2)     Status: None (Preliminary result)   Collection Time: 07/02/21  8:34 AM   Specimen: BLOOD RIGHT HAND  Result Value Ref Range Status   Specimen Description BLOOD RIGHT HAND  Final   Special Requests   Final    BOTTLES DRAWN AEROBIC AND ANAEROBIC Blood Culture results may not be optimal due to an inadequate volume of blood received in culture bottles   Culture   Final    NO GROWTH < 24 HOURS Performed at [facility], [place] [address] [place], [place] [address]    Report Status PENDING  Incomplete  Culture, blood (routine x 2)     Status: None (Preliminary result)   Collection  Time: 07/02/21  8:34 AM   Specimen: BLOOD  Result Value Ref Range Status   Specimen Description BLOOD LEFT ANTECUBITAL  Final   Special Requests   Final    BOTTLES DRAWN AEROBIC AND ANAEROBIC Blood Culture adequate volume   Culture   Final    NO GROWTH < 24 HOURS Performed at [facility], [place] [address] [place], [place] [address]    Report Status PENDING  Incomplete    RADIOLOGY STUDIES/RESULTS: DG CHEST PORT 1 VIEW  Result Date: 07/03/2021 CLINICAL DATA:  80 year old male with history of acute hypoxemic respiratory failure. EXAM: PORTABLE CHEST 1 VIEW COMPARISON:  Chest x-ray 07/01/2021. FINDINGS: Small right pleural effusion with atelectasis and/or consolidation in the right lung base. Left lung is clear. No left pleural effusion. No definite pneumothorax. Mild diffuse interstitial prominence, similar to prior studies. No cephalization of the pulmonary vasculature. Heart size is upper limits of normal. Upper mediastinal contours are distorted by patient positioning and lordotic projection. Atherosclerotic calcifications in the thoracic aorta. Electronic device projecting over the left hemithorax, presumably an implantable loop recorder. IMPRESSION: 1. Persistent small right pleural effusion with atelectasis and/or consolidation in the right lung base. 2. Aortic atherosclerosis. Electronically Signed   By: Vinnie Langton M.D.   On: 07/03/2021 05:48   DG Chest Port 1 View  Result Date: 07/01/2021 CLINICAL DATA:  COVID.  Shortness of breath. EXAM: PORTABLE CHEST 1 VIEW COMPARISON:  CT angiogram chest and chest x-ray 06/30/2021. FINDINGS: Small right pleural effusion is unchanged. There is linear atelectasis or scarring in the right lower lung similar to the prior study. There is no new focal lung infiltrate. There is  no pneumothorax. The cardiomediastinal silhouette is within normal limits for projection. No acute fractures are seen. IMPRESSION: 1. Stable right pleural effusion with  right lower lung scarring. 2. No new lung infiltrate. Electronically Signed   By: Ronney Asters M.D.   On: 07/01/2021 18:46   VAS [name] LOWER EXTREMITY VENOUS (DVT)  Result Date: 07/02/2021  Lower Venous DVT Study Patient Name:  Troy Lacivita [name]  Date of Exam:   07/02/2021 Medical Rec #: [id number]               Accession #:    [id number] Date of Birth: 10/03/1940              Patient Gender: M Patient Age:   80 years Exam Location:  [facility] Procedure:      VAS [name] LOWER EXTREMITY VENOUS (DVT) Referring Phys: Oren Binet --------------------------------------------------------------------------------  Indications: Covid, elevated D-Dimer.  Comparison Study: No prior LEV on file Performing Technologist: Sharion Dove RVS  Examination Guidelines: A complete evaluation includes B-mode imaging, spectral Doppler, color Doppler, and power Doppler as needed of all accessible portions of each vessel. Bilateral testing is considered an integral part of a complete examination. Limited examinations for reoccurring indications may be performed as noted. The reflux portion of the exam is performed with the patient in reverse Trendelenburg.  +---------+---------------+---------+-----------+----------+-------------------+ RIGHT    CompressibilityPhasicitySpontaneityPropertiesThrombus Aging      +---------+---------------+---------+-----------+----------+-------------------+ CFV      Full                                         pulsatile waveforms +---------+---------------+---------+-----------+----------+-------------------+ SFJ      Full                                                             +---------+---------------+---------+-----------+----------+-------------------+ FV Prox  Full                                                             +---------+---------------+---------+-----------+----------+-------------------+ FV Mid   Full                                                              +---------+---------------+---------+-----------+----------+-------------------+ FV DistalFull                                                             +---------+---------------+---------+-----------+----------+-------------------+ PFV      Full                                                             +---------+---------------+---------+-----------+----------+-------------------+  POP      Full                                         pulsatile waveforms +---------+---------------+---------+-----------+----------+-------------------+ PTV      Full                                                             +---------+---------------+---------+-----------+----------+-------------------+ PERO     Full                                                             +---------+---------------+---------+-----------+----------+-------------------+   +---------+---------------+---------+-----------+----------+-------------------+ LEFT     CompressibilityPhasicitySpontaneityPropertiesThrombus Aging      +---------+---------------+---------+-----------+----------+-------------------+ CFV      Full                                         pulsatile waveforms +---------+---------------+---------+-----------+----------+-------------------+ SFJ      Full                                                             +---------+---------------+---------+-----------+----------+-------------------+ FV Prox  Full                                                             +---------+---------------+---------+-----------+----------+-------------------+ FV Mid   Full                                                             +---------+---------------+---------+-----------+----------+-------------------+ FV DistalFull                                                              +---------+---------------+---------+-----------+----------+-------------------+ PFV      Full                                                             +---------+---------------+---------+-----------+----------+-------------------+ POP      Full  pulsatile waveforms +---------+---------------+---------+-----------+----------+-------------------+ PTV      Full                                                             +---------+---------------+---------+-----------+----------+-------------------+ PERO     Full                                                             +---------+---------------+---------+-----------+----------+-------------------+    Summary: BILATERAL: - No evidence of deep vein thrombosis seen in the lower extremities, bilaterally. -   *See table(s) above for measurements and observations.    Preliminary    ECHOCARDIOGRAM LIMITED  Result Date: 07/02/2021    ECHOCARDIOGRAM LIMITED REPORT   Patient Name:   Troy Stetzer [name] Date of Exam: 07/02/2021 Medical Rec #:  [id number]              Height:       65.0 in Accession #:    [id number]             Weight:       200.0 lb Date of Birth:  1941/07/03             BSA:          1.978 m Patient Age:    25 years               BP:           121/59 mmHg Patient Gender: M                      HR:           75 bpm. Exam Location:  Inpatient Procedure: Limited Echo and Limited Color Doppler Indications:    Elevated Troponin  History:        Patient has prior history of Echocardiogram examinations, most                 recent 01/13/2020. CHF, Stroke, COPD and PAD, Arrythmias:Atrial                 Fibrillation; Risk Factors:Dyslipidemia and Hypertension.                 Covid-19 infection.  Sonographer:    Alvino Chapel RCS Referring Phys: Babs Bertin MD  Sonographer Comments: Technically challenging study due to limited acoustic windows and suboptimal apical window. Only a  limited echo could be obtained with subcostal window being the best available. IMPRESSIONS  1. Limited study.  2. Left ventricular ejection fraction, by estimation, is 55 to 60%. The left ventricle has normal function. Left ventricular endocardial border not optimally defined to evaluate regional wall motion. There is moderate left ventricular hypertrophy.  3. Right ventricular systolic function is mildly reduced. The right ventricular size is normal. Tricuspid regurgitation signal is inadequate for assessing PA pressure.  4. The mitral valve is abnormal. Trivial mitral valve regurgitation. Moderate mitral annular calcification.  5. Tricuspid valve regurgitation is mild to moderate.  6. The aortic valve was not well visualized. There is moderate calcification of the aortic valve.  7. The inferior vena cava is dilated in size with >50% respiratory variability, suggesting right atrial pressure of 8 mmHg. Comparison(s): Prior images reviewed side by side. LVEF remains normal range, unable to accurately evaluate regional wall motion. FINDINGS  Left Ventricle: Left ventricular ejection fraction, by estimation, is 55 to 60%. The left ventricle has normal function. Left ventricular endocardial border not optimally defined to evaluate regional wall motion. The left ventricular internal cavity size was normal in size. There is moderate left ventricular hypertrophy. Right Ventricle: The right ventricular size is normal. Right ventricular systolic function is mildly reduced. Tricuspid regurgitation signal is inadequate for assessing PA pressure. Mitral Valve: The mitral valve is abnormal. Moderate mitral annular calcification. Trivial mitral valve regurgitation. Tricuspid Valve: The tricuspid valve is grossly normal. Tricuspid valve regurgitation is mild to moderate. Aortic Valve: The aortic valve was not well visualized. There is moderate calcification of the aortic valve. Venous: The inferior vena cava is dilated in size  with greater than 50% respiratory variability, suggesting right atrial pressure of 8 mmHg. LEFT VENTRICLE PLAX 2D LVIDd:         4.00 cm LVIDs:         2.70 cm LV PW:         1.30 cm LV IVS:        1.30 cm LVOT diam:     1.80 cm LVOT Area:     2.54 cm   SHUNTS Systemic Diam: 1.80 cm Rozann Lesches MD Electronically signed by Rozann Lesches MD Signature Date/Time: 07/02/2021/5:07:31 PM    Final      LOS: 2 days   Oren Binet, MD  [facility]    To contact the attending provider between 7A-7P or the covering provider during after hours 7P-7A, please log into the web site [email or web address] and access using universal [place] password for that web site. If you do not have the password, please call the hospital operator.  07/03/2021, 10:43 AM

## 2021-07-03 NOTE — Evaluation (Signed)
Physical Therapy Evaluation Patient Details Name: Troy Derego [name] MRN: [id number] DOB: June 06, 1941 Today's Date: 07/03/2021  History of Present Illness  80 yo male with acute respiratory failure from Covid 19 was admitted 10/1, noted to be SOB, coughing, feverish, with myalgias and pleural effusion. Sustained a fall at home in [facility], brought to [facility] ED.  PMHx:  atherosclerosis, COPD, PAF, CHF 3a, CKD, HTN  Clinical Impression  Pt was seen for progression of movement from bed to gait, and pt is expecting to go directly home.  Talked with him about going to rehab and he is not fully convinced.  Follow along with him to mobilize as tolerated, in the event he decides to go home with his previous incomplete caregiver assist.   Pt may be able to get more help to cover home, and will be able to change his discharge plan in that event.  Even so, with pt's instability posteriorly, he is a higher fall risk and visually impaired as well.  Recommend SNF for now with confidence due to his lack of safety with gait and transfers.     Recommendations for follow up therapy are one component of a multi-disciplinary discharge planning process, led by the attending physician.  Recommendations may be updated based on patient status, additional functional criteria and insurance authorization.  Follow Up Recommendations SNF    Equipment Recommendations  None recommended by PT    Recommendations for Other Services       Precautions / Restrictions Precautions Precautions: Fall Precaution Comments: monitor O2 sats Restrictions Weight Bearing Restrictions: No      Mobility  Bed Mobility Overal bed mobility: Needs Assistance Bed Mobility: Supine to Sit;Sit to Supine     Supine to sit: Mod assist Sit to supine: Mod assist        Transfers Overall transfer level: Needs assistance Equipment used: 1 person hand held assist Transfers: Sit to/from Stand Sit to Stand: Mod assist         General  transfer comment: mod to power up  Ambulation/Gait Ambulation/Gait assistance: Min assist Gait Distance (Feet): 6 Feet Assistive device: Right platform walker;1 person hand held assist Gait Pattern/deviations: Step-to pattern;Step-through pattern;Decreased stride length;Wide base of support Gait velocity: reduced Gait velocity interpretation: <1.8 ft/sec, indicate of risk for recurrent falls General Gait Details: pt took sidesteps and forward back steps, very posteriorly unsteady  Stairs            Wheelchair Mobility    Modified Rankin (Stroke Patients Only)       Balance Overall balance assessment: Needs assistance Sitting-balance support: Feet supported Sitting balance-Leahy Scale: Fair     Standing balance support: Bilateral upper extremity supported Standing balance-Leahy Scale: Poor                   Standardized Balance Assessment Standardized Balance Assessment :  (reviewed Berg higher level skills with poor tolerance, indicated RW needed)           Pertinent Vitals/Pain Pain Assessment: No/denies pain    Home Living Family/patient expects to be discharged to:: Private residence Living Arrangements: Alone Available Help at Discharge: Personal care attendant;Available PRN/intermittently (40 hours a week) Type of Home: House Home Access: Stairs to enter   [employer] of Steps: 1+1 Home Layout: One level Home Equipment: George - 2 wheels;Cane - single point Additional Comments: Pt was walking with independence at home, no AD needed    Prior Function Level of Independence: Independent  Comments: vision is his main limit of mobility, held furniture and was familiar with the terrain     Hand Dominance   Dominant Hand: Right    Extremity/Trunk Assessment   Upper Extremity Assessment Upper Extremity Assessment: Overall WFL for tasks assessed    Lower Extremity Assessment Lower Extremity Assessment: Generalized  weakness    Cervical / Trunk Assessment Cervical / Trunk Assessment: Kyphotic (mild)  Communication   Communication: No difficulties  Cognition Arousal/Alertness: Awake/alert Behavior During Therapy: WFL for tasks assessed/performed Overall Cognitive Status: No family/caregiver present to determine baseline cognitive functioning                                 General Comments: giving history but no family to corroborate      General Comments General comments (skin integrity, edema, etc.): pt is up to side of bed with help needed, mildly distressed his system and dropped sats to 86% level on room air.  Struggle to get reading from his hands of sats    Exercises     Assessment/Plan    PT Assessment Patient needs continued PT services  PT Problem List Decreased strength;Decreased activity tolerance;Decreased balance;Decreased mobility;Decreased coordination;Decreased knowledge of use of DME;Decreased safety awareness;Cardiopulmonary status limiting activity       PT Treatment Interventions DME instruction;Gait training;Stair training;Functional mobility training;Therapeutic activities;Therapeutic exercise;Balance training;Neuromuscular re-education;Patient/family education    PT Goals (Current goals can be found in the Care Plan section)  Acute Rehab PT Goals Patient Stated Goal: to walk and get home PT Goal Formulation: With patient Time For Goal Achievement: 07/17/21 Potential to Achieve Goals: Good    Frequency Min 3X/week   Barriers to discharge Inaccessible home environment;Decreased caregiver support has limited caregive help and has variable caregiver performance per pt    Co-evaluation               AM-PAC PT "6 Clicks" Mobility  Outcome Measure Help needed turning from your back to your side while in a flat bed without using bedrails?: A Little Help needed moving from lying on your back to sitting on the side of a flat bed without using  bedrails?: A Lot Help needed moving to and from a bed to a chair (including a wheelchair)?: A Lot Help needed standing up from a chair using your arms (e.g., wheelchair or bedside chair)?: A Lot Help needed to walk in hospital room?: A Lot Help needed climbing 3-5 steps with a railing? : A Lot 6 Click Score: 13    End of Session Equipment Utilized During Treatment: Gait belt Activity Tolerance: Patient limited by fatigue;Treatment limited secondary to medical complications (Comment) (vision and LOB backwrd) Patient left: in bed;with call bell/phone within reach Nurse Communication: Mobility status;Other (comment) (pt declaration\ to go home despite need for rehab) PT Visit Diagnosis: Unsteadiness on feet (R26.81);Muscle weakness (generalized) (M62.81);Difficulty in walking, not elsewhere classified (R26.2);Adult, failure to thrive (R62.7)    Time: 5053-9767 PT Time Calculation (min) (ACUTE ONLY): 23 min   Charges:   PT Evaluation $PT Eval Moderate Complexity: 1 Mod PT Treatments $Therapeutic Activity: 8-22 mins       Ramond Dial 07/03/2021, 9:02 PM  Mee Hives, PT MS Acute Rehab Dept. Number: [place] and [place]

## 2021-07-04 DIAGNOSIS — N179 Acute kidney failure, unspecified: Secondary | ICD-10-CM | POA: Diagnosis not present

## 2021-07-04 DIAGNOSIS — J441 Chronic obstructive pulmonary disease with (acute) exacerbation: Secondary | ICD-10-CM | POA: Diagnosis not present

## 2021-07-04 DIAGNOSIS — U071 COVID-19: Secondary | ICD-10-CM | POA: Diagnosis not present

## 2021-07-04 DIAGNOSIS — J9601 Acute respiratory failure with hypoxia: Secondary | ICD-10-CM | POA: Diagnosis not present

## 2021-07-04 LAB — BASIC METABOLIC PANEL
Anion gap: 11 (ref 5–15)
BUN: 40 mg/dL — ABNORMAL HIGH (ref 8–23)
CO2: 22 mmol/L (ref 22–32)
Calcium: 8.2 mg/dL — ABNORMAL LOW (ref 8.9–10.3)
Chloride: 101 mmol/L (ref 98–111)
Creatinine, Ser: 1.26 mg/dL — ABNORMAL HIGH (ref 0.61–1.24)
GFR, Estimated: 58 mL/min — ABNORMAL LOW (ref >60)
Glucose, Bld: 186 mg/dL — ABNORMAL HIGH (ref 70–99)
Potassium: 3.8 mmol/L (ref 3.5–5.1)
Sodium: 134 mmol/L — ABNORMAL LOW (ref 135–145)

## 2021-07-04 LAB — PROCALCITONIN: Procalcitonin: 29.57 ng/mL

## 2021-07-04 MED ORDER — BENZONATATE 100 MG PO CAPS
200.0000 mg | ORAL_CAPSULE | Freq: Three times a day (TID) | ORAL | Status: DC | PRN
  Administered 2021-07-04: 200 mg via ORAL
  Filled 2021-07-04: qty 2

## 2021-07-04 NOTE — Plan of Care (Signed)
  Problem: Coping: Goal: Level of anxiety will decrease Outcome: Progressing   Problem: Pain Managment: Goal: General experience of comfort will improve Outcome: Progressing   Problem: Safety: Goal: Ability to remain free from injury will improve Outcome: Progressing   Problem: Skin Integrity: Goal: Risk for impaired skin integrity will decrease Outcome: Progressing   

## 2021-07-04 NOTE — TOC Initial Note (Signed)
Transition of Care (TOC) - Initial/Assessment Note    Patient Details  Name: Troy Krise [name] MRN: [id number] Date of Birth: Jan 15, 1941  Transition of Care [facility]) CM/SW Contact:    Angelita Ingles, RN Phone Number:[phone]  07/04/2021, 3:33 PM  Clinical Narrative:                 [facility] consulted for patient with high risk for readmission and SNF recommendation. CM called room to discuss disposition with the patient. Patient states that he is not going to rehab. Patient ended conversation stating that he had to use the restroom. CM spoke with daughter Troy [name] who was at the bedside. Troy [name] verbalized understanding for short term rehab recommendation but states that patient is adamant that he is not going to rehab. Daughter states that patient is from home where he has caregivers from 9am-7pm daily. Daughter also reports that patient has CPAP & rollator at home and does not currently have need for any other DME. Per daughter the patients caregivers provide assistance with ADL's, meals, shopping, appointments etc.  1550 CM spoke with patient who confirms that he does not want SNF for short term . Patient states that he has private caregivers daily from 9am-8pm and is willing to try Home Health physical therapy. TOC will continue to follow for disposition needs. Per patient he currently has no DME needs. TOC will continue to followfor any disposition needs.    Expected Discharge Plan: [place] Barriers to Discharge: Continued Medical Work up   Patient Goals and CMS Choice Patient states their goals for this hospitalization and ongoing recovery are:: Per daughter patient states that he wants to go home CMS Medicare.gov Compare Post Acute Care list provided to:: Patient Represenative (must comment) Choice offered to / list presented to : Patient, [facility] POA / Guardian  Expected Discharge Plan and Services Expected Discharge Plan: [place] In-house Referral:  NA Discharge Planning Services: CM Consult Post Acute Care Choice: [place] arrangements for the past 2 months: Single Family Home                 DME Arranged: N/A DME Agency: NA       HH Arranged: NA HH Agency: NA        Prior Living Arrangements/Services Living arrangements for the past 2 months: Single Family Home Lives with:: Self Patient language and need for interpreter reviewed:: Yes        Need for Family Participation in Patient Care: Yes (Comment) Care giver support system in place?: Yes (comment) Current home services: Other (comment) (Private sitters from 9a-7pm) Criminal Activity/Legal Involvement Pertinent to Current Situation/Hospitalization: No - Comment as needed  Activities of Daily Living Home Assistive Devices/Equipment: CPAP, Blood pressure cuff, Eyeglasses, External Monitoring Devices, Grab bars around toilet, Grab bars in shower, Shower chair with back, Environmental consultant (specify type) ADL Screening (condition at time of admission) Patient's cognitive ability adequate to safely complete daily activities?: Yes Is the patient deaf or have difficulty hearing?: No Does the patient have difficulty seeing, even when wearing glasses/contacts?: Yes Does the patient have difficulty concentrating, remembering, or making decisions?: No Patient able to express need for assistance with ADLs?: Yes Does the patient have difficulty dressing or bathing?: No Independently performs ADLs?: Yes (appropriate for developmental age) Does the patient have difficulty walking or climbing stairs?: No Weakness of Legs: Both Weakness of Arms/Hands: None  Permission Sought/Granted  Emotional Assessment Appearance:: Appears stated age Attitude/Demeanor/Rapport: Avoidant Affect (typically observed): Unable to Assess Orientation: : Oriented to Self, Oriented to Place, Oriented to Situation, Oriented to  Time Alcohol / Substance Use: Not Applicable Psych  Involvement: No (comment)  Admission diagnosis:  Shortness of breath [R06.02] Acute hypoxemic respiratory failure due to COVID-19 ([place]) [U07.1, J96.01] COVID-19 virus infection [U07.1] Patient Active Problem List   Diagnosis Date Noted   Acute respiratory failure with hypoxia (HCC) 07/02/2021   SOB (shortness of breath) 07/02/2021   AKI (acute kidney injury) ([place]) 07/02/2021   Hypomagnesemia 07/02/2021   COPD with acute exacerbation ([place]) 07/02/2021   Hypokalemia 07/02/2021   Elevated troponin 07/02/2021   Acute hypoxemic respiratory failure due to COVID-19 ([place]) 07/02/2021   Hyperlipidemia    COVID-19 virus infection 07/01/2021   Chronic anticoagulation 10/18/2020   Hx of adenomatous colonic polyps 10/18/2020   History of gastric polyps 10/18/2020   Iron deficiency 10/18/2020   History of anemia 10/18/2020   Gastric intestinal metaplasia 10/18/2020   Aortic atherosclerosis ([place]) 08/01/2020   Secondary hypercoagulable state ([place]) 03/22/2020   Personal history of colon cancer 01/08/2020   Epigastric pain 01/08/2020   Abnormal findings on esophagogastroduodenoscopy (EGD) 01/08/2020   Calculus of gallbladder without cholecystitis without obstruction 01/08/2020   Vertigo 11/25/2019   Cancer of sigmoid colon ([place]) 12/12/2018   Pleural effusion on right 07/15/2018   Colon adenocarcinoma ([place]) 06/30/2018   Acute GI bleeding 06/23/2018   Depression 05/06/2018   Syncope 01/29/2018   Chronic diastolic CHF (congestive heart failure) ([place]) 05/04/2017   CAD (coronary artery disease) 05/03/2017   MGUS (monoclonal gammopathy of unknown significance) 03/07/2016   Benign essential HTN 01/19/2016   OSA (obstructive sleep apnea) 08/29/2015   PAF (paroxysmal atrial fibrillation) (HCC)    Glaucoma 06/22/2015   DOE (dyspnea on exertion) 02/04/2015   Memory loss, short term 08/03/2014   Occlusion and stenosis of carotid artery without mention of cerebral infarction 07/09/2013   COPD GOLD II   07/09/2013   Chronic renal disease, stage 3, moderately decreased glomerular filtration rate between 30-59 mL/min/1.73 square meter (HCC) 07/06/2013   TIA (transient ischemic attack) 07/06/2013   Basal cell carcinoma of skin 10/10/2012   History of lung cancer 03/11/2012   PAD (peripheral artery disease) ([place]) 12/16/2011   AAA (abdominal aortic aneurysm) ([place]) 09/27/2011   Squamous cell lung cancer ([place]) 11/08/2010   GAIT IMBALANCE 03/02/2009   Prediabetes 03/02/2009   HYPERPLASIA PROSTATE UNS W/O UR OBST & OTH LUTS 01/15/2008   HYPERLIPIDEMIA 09/29/2007   Peripheral vascular disease ([place]) 09/29/2007   LEUKOPLAKIA, VOCAL CORDS 09/29/2007   FEMORAL BRUIT 09/29/2007   PCP:  Binnie Rail, MD Pharmacy:   [place], [place] [address] Phone: [phone] Fax: [phone]     Social Determinants of Health (SDOH) Interventions    Readmission Risk Interventions Readmission Risk Prevention Plan 07/04/2021 07/04/2021  Transportation Screening Complete Complete  Medication Review (RN Care Manager) Referral to Pharmacy Referral to Pharmacy  PCP or Specialist appointment within 3-5 days of discharge Complete Complete  HRI or Home Care Consult Complete Complete  SW Recovery Care/Counseling Consult Complete -  Palliative Care Screening Not Applicable -  [place] Patient Refused -  Some recent data might be hidden

## 2021-07-04 NOTE — Progress Notes (Signed)
No cpap tonight, Pt does not want to hear any  noise in the room.

## 2021-07-04 NOTE — Evaluation (Signed)
Occupational Therapy Evaluation Patient Details Name: Troy [name] [name] MRN: [id number] DOB: 06-22-1941 Today's Date: 07/04/2021   History of Present Illness 80 y.o. male presenting to [facility] ED with SOB and productive cough x several days. Positive home COVID-19 test 9/26. Patient admitted with severe COVID-19 infection, acute COPD exacerbation, acute hypoxic respiratory failure and AKI. PMHx significant for CHF, A-fib, COPD, PAF, CKD IIIa and HLD.   Clinical Impression   PTA patient was living alone in a private residence and was grossly I with ADLs/IADLs without AD. Has PCA from 9am-1pm and from 1pm-7pm. Patient with low vision at baseline reporting furniture walking to get around home. Patient currently functioning near baseline demonstrating observed ADLs including LB dressing with Min guard and cues secondary to low vision. Patient also limited by deficits listed below including mild balance deficits, decreased cardiopulmonary status, and generalized weakness and would benefit from continued acute OT services in prep for safe d/c home. Patient currently declining SNF rehab. Daughter present at bedside reports desire for patient to transition to ALF but at this time patient is not in agreement. OT will continue to follow acutely.        Recommendations for follow up therapy are one component of a multi-disciplinary discharge planning process, led by the attending physician.  Recommendations may be updated based on patient status, additional functional criteria and insurance authorization.   Follow Up Recommendations  Home health OT;Other (comment) (Near constant supervision A secondary to low vision)    Equipment Recommendations  None recommended by OT    Recommendations for Other Services       Precautions / Restrictions Precautions Precautions: Fall Precaution Comments: monitor O2 sats; legally blind in R eye; low vision in L eye Restrictions Weight Bearing Restrictions: No       Mobility Bed Mobility Overal bed mobility: Needs Assistance Bed Mobility: Supine to Sit     Supine to sit: Supervision     General bed mobility comments: Supervision A for supine to EOB with use of bed rail. Able to complete lateral scoots toward EOB without external assist.    Transfers Overall transfer level: Needs assistance Equipment used: 1 person hand held assist Transfers: Sit to/from Stand Sit to Stand: Min guard         General transfer comment: Min guard for steadying; no assist needed for power negotiation this date.    Balance Overall balance assessment: Needs assistance Sitting-balance support: Feet supported Sitting balance-Leahy Scale: Good     Standing balance support: Bilateral upper extremity supported Standing balance-Leahy Scale: Fair Standing balance comment: Able to maintain static standing balance with supervision A. Unilateral UE support for dynamic balance.                           ADL either performed or assessed with clinical judgement   ADL Overall ADL's : Needs assistance/impaired Eating/Feeding: Set up;Sitting   Grooming: Supervision/safety;Standing Grooming Details (indicate cue type and reason): Standing at sink surface with supervision A for safety and assist to locate ADL items on sink surface 2/2 low vision. Upper Body Bathing: Set up;Sitting   Lower Body Bathing: Min guard;Sit to/from stand   Upper Body Dressing : Set up;Sitting   Lower Body Dressing: Min guard;Sit to/from stand Lower Body Dressing Details (indicate cue type and reason): Able to doff/don footwear seated EOB without external assist. Would likley require Min guard for LB dressing in standing 2/2 mild balance deficits and low vision.  Toilet Transfer: Magazine features editor Details (indicate cue type and reason): Simulated with transfer to recliner and HHA.                 Vision Baseline Vision/History: 4 Cataracts (s/p failed  surgery) Ability to See in Adequate Light: 4 Severely impaired Patient Visual Report: No change from baseline Additional Comments: Patient with low vision at baseline; blind in R eye. Able to see very little in L eye.     Perception     Praxis      Pertinent Vitals/Pain Pain Assessment: No/denies pain     Hand Dominance Right   Extremity/Trunk Assessment Upper Extremity Assessment Upper Extremity Assessment: Overall WFL for tasks assessed   Lower Extremity Assessment Lower Extremity Assessment: Defer to PT evaluation   Cervical / Trunk Assessment Cervical / Trunk Assessment: Kyphotic (mild)   Communication Communication Communication: No difficulties   Cognition Arousal/Alertness: Awake/alert Behavior During Therapy: WFL for tasks assessed/performed Overall Cognitive Status: Within Functional Limits for tasks assessed                                 General Comments: Daughter present at bedside. Able to confirm all PLOF and home set-up information proivded at time of PT eval.   General Comments  Daughter present at bedside throughout assessment. SpO2 100% on 1.5L. Titatrated to RA for functional mobility and oral hygiene standing at sink level. SpO2 94%-97% with activity.    Exercises     Shoulder Instructions      Home Living Family/patient expects to be discharged to:: Private residence Living Arrangements: Alone Available Help at Discharge: Personal care attendant;Available PRN/intermittently (9am-1pm and 1pm-7pm) Type of Home: House Home Access: Stairs to enter [employer] of Steps: 1+1   Home Layout: One level         Bathroom Toilet: Handicapped height     Home Equipment: Environmental consultant - 2 wheels;Cane - single point;Walker - 4 wheels;Other (comment) (CPAP)          Prior Functioning/Environment Level of Independence: Needs assistance  Gait / Transfers Assistance Needed: No use of AD; furniture walks 2/2 visual deficits ADL's /  Homemaking Assistance Needed: Mod I with ADLs; bathes standing at shower level without supervision A; assist from PCA's for IADLs; daughters assist with filling pillbox but patient takes meds from pillbox on his own daily.            OT Problem List: Impaired balance (sitting and/or standing);Impaired vision/perception      OT Treatment/Interventions: Self-care/ADL training;Therapeutic exercise;Energy conservation;DME and/or AE instruction;Therapeutic activities;Visual/perceptual remediation/compensation;Patient/family education;Balance training    OT Goals(Current goals can be found in the care plan section) Acute Rehab OT Goals Patient Stated Goal: To return home. OT Goal Formulation: With patient Time For Goal Achievement: 07/18/21 Potential to Achieve Goals: Good ADL Goals Additional ADL Goal #1: Patient will complete ADLs with supervision A and min cues to compensate for visual deficits only. Additional ADL Goal #2: Patient will recall 3 techniques to reduce risk of falls in prep for safe return home. Additional ADL Goal #3: Patient will complete walk-in shower transfers with supervision A, use of grab bars and good safety awareness in prep for safe d/c home.  OT Frequency: Min 2X/week   Barriers to D/C: Decreased caregiver support  Lives alone; has PCA from 9am to 7pm.       Co-evaluation  AM-PAC OT "6 Clicks" Daily Activity     Outcome Measure Help from another person eating meals?: A Little Help from another person taking care of personal grooming?: A Little Help from another person toileting, which includes using toliet, bedpan, or urinal?: A Little Help from another person bathing (including washing, rinsing, drying)?: A Little Help from another person to put on and taking off regular upper body clothing?: A Little Help from another person to put on and taking off regular lower body clothing?: A Little 6 Click Score: 18   End of Session Equipment  Utilized During Treatment: Oxygen (Titrated from 1.5L to RA. Please refer below for additional details.) Nurse Communication: Mobility status;Other (comment) (Titrated to RA.)  Activity Tolerance: Patient tolerated treatment well Patient left: in chair;with call bell/phone within reach  OT Visit Diagnosis: Unsteadiness on feet (R26.81);Muscle weakness (generalized) (M62.81);History of falling (Z91.81);Low vision, both eyes (H54.2)                Time: 8657-8469 OT Time Calculation (min): 34 min Charges:  OT General Charges $OT Visit: 1 Visit OT Evaluation $OT Eval Low Complexity: 1 Low OT Treatments $Self Care/Home Management : 8-22 mins  Jameah Rouser H. OTR/L Supplemental OT, Department of rehab services [phone]  Darcey Demma R H. 07/04/2021, 12:09 PM

## 2021-07-04 NOTE — Progress Notes (Signed)
PROGRESS NOTE        PATIENT DETAILS Name: Troy Heatherly [name] Age: 80 y.o. Sex: male Date of Birth: 08/23/1941 Admit Date: 07/01/2021 Admitting Physician Evalee Mutton Kristeen Mans, MD JFH:LKTGY, Claudina Lick, MD  Brief Narrative: Patient is a 80 y.o. male with history of COPD, PAF, HFpEF, CKD stage IIIa-who was diagnosed with COVID-19 on 9/26 (home test)-presented with shortness of breath-felt to be due to COPD exacerbation in the setting of COVID-19 infection.  See below for further details.  Subjective: Feels better-wants to go home-and does not want to go to SNF.  Still some coughing.  Objective:  Vitals: Blood pressure (!) 148/69, pulse (!) 58, temperature 97.8 F (36.6 C), temperature source Oral, resp. rate 20, height 6\' 1"  (1.854 m), weight 88.9 kg, SpO2 96 %.   Exam: Gen Exam:Alert awake-not in any distress HEENT:atraumatic, normocephalic Chest: B/L clear to auscultation anteriorly CVS:S1S2 regular Abdomen:soft non tender, non distended Extremities:no edema Neurology: Non focal Skin: no rash   Pertinent Labs/Radiology: Na: 134 K: 3.8  creatinine: 1.26  procalcitonin: 95.74>> 46.88>> 29.57  Recent Labs    07/01/21 1822 07/01/21 2113 07/02/21 0611 07/02/21 0834 07/03/21 0510  DDIMER 4.83*  --  3.45*  --  2.64*  FERRITIN  --   --  507* 615*  --   LDH  --  185 173  --   --   CRP  --   --  26.1* 28.2* 22.2*    10/2>> blood culture: No growth  9/30>>CT angio chest/abdomen: No central PE, 3.7 cm AAA-no dissection-no acute abnormalities in abdomen. 10/02>> lower extremity Doppler: No DVT. 10/02>> Echo: EF 55-60%   Assessment and plan: Acute hypoxic respiratory failure due to COPD exacerbation: Provoked by COVID-19 infection-overall improved-continue bronchodilators and tapering steroids-continue to taper down FiO2.  Continue to mobilize.  Probable right lower lobe PNA: Afebrile-clinically improved-procalcitonin levels downtrending-remains  on Rocephin/Zithromax.  Cultures negative so far.    COVID-19 infection: Completed a 5-day course of Molnuparivir prior to this hospitalization-since no significant infiltrates on imaging-do not think patient requires COVID-19 treatment.  On steroids already for COPD exacerbation.  Elevated D-dimer: Likely related to COVID-19 infection-CTA chest/Dopplers negative for DVT.  Continue Eliquis.   AKI on CKD stage IIIa: AKI hemodynamically mediated-improved-close to baseline.  Check electrolytes periodically.  Minimal troponin elevation: Likely demand ischemia/AKI-trend is flat-not suggestive of ACS.  Supportive care.  PAF: Maintaining sinus rhythm-continue telemetry monitoring-on metoprolol and Eliquis.  HFpEF: Euvolemic on exam-continue metoprolol and Lasix.  HTN: BP controlled-continue metoprolol/amlodipine and Lasix.  Follow and adjust.   HLD: Continue statin  History of carotid artery stenosis-s/p right CEA  History of PAD-s/p fem pop bypass in 11/16  History of amiodarone induced thyrotoxicosis: Follows with endocrinology-no longer on methimazole-recent thyroid function tests were normal.  History of squamous cell cancer of the lung-s/p right lower lobe lobectomy-chronic right pleural effusion  History of sigmoid adenocarcinoma-s/p surgical resection-appears to be in remission  History of lung nodule: Follows with Pulmonology in the outpatient setting  OSA: Resume CPAP nightly.  Procedures: None Consults: None DVT Prophylaxis: Eliquis Code Status:Full code  Family Communication: Daugher-Kristy-[phone] updated over the phone 10/4  Time spent: 25 minutes-Greater than 50% of this time was spent in counseling, explanation of diagnosis, planning of further management, and coordination of care.  Diet: Diet Order  Diet regular Room service appropriate? Yes with Assist; Fluid consistency: Thin  Diet effective now                      Disposition  Plan: Status is: Inpatient  Remains inpatient appropriate because:Inpatient level of care appropriate due to severity of illness  Dispo: The patient is from: Home              Anticipated d/c is to: Home              Patient currently is not medically stable to d/c.   Difficult to place patient No    Barriers to Discharge: On oxygen/IV antibiotics-not at baseline  Antimicrobial agents: Anti-infectives (From admission, onward)    Start     Dose/Rate Route Frequency Ordered Stop   07/03/21 1500  cefTRIAXone (ROCEPHIN) 2 g in sodium chloride 0.9 % 100 mL IVPB  Status:  Discontinued        2 g 200 mL/hr over 30 Minutes Intravenous Every 24 hours 07/02/21 1507 07/02/21 2032   07/02/21 2100  cefTRIAXone (ROCEPHIN) 2 g in sodium chloride 0.9 % 100 mL IVPB        2 g 200 mL/hr over 30 Minutes Intravenous Every 24 hours 07/02/21 2032     07/02/21 1000  cefTRIAXone (ROCEPHIN) 2 g in sodium chloride 0.9 % 100 mL IVPB  Status:  Discontinued        2 g 200 mL/hr over 30 Minutes Intravenous Every 24 hours 07/02/21 0949 07/02/21 1507   07/02/21 0945  azithromycin (ZITHROMAX) 500 mg in sodium chloride 0.9 % 250 mL IVPB        500 mg 250 mL/hr over 60 Minutes Intravenous Every 24 hours 07/02/21 0942          MEDICATIONS: Scheduled Meds:  amLODipine  5 mg Oral Daily   apixaban  5 mg Oral BID   brinzolamide  1 drop Both Eyes BID   And   brimonidine  1 drop Both Eyes BID   furosemide  60 mg Oral Daily   influenza vaccine adjuvanted  0.5 mL Intramuscular Tomorrow-1000   latanoprost  1 drop Both Eyes QHS   levalbuterol  2 puff Inhalation Q6H   methylPREDNISolone (SOLU-MEDROL) injection  60 mg Intravenous Q12H   metoprolol succinate  12.5 mg Oral Daily   montelukast  10 mg Oral QHS   pantoprazole  40 mg Oral Daily   rosuvastatin  40 mg Oral Daily   umeclidinium bromide  1 puff Inhalation Daily   Continuous Infusions:  azithromycin Stopped (07/03/21 1206)   cefTRIAXone (ROCEPHIN)  IV 2  g (07/03/21 2050)   PRN Meds:.acetaminophen **OR** acetaminophen, levalbuterol, melatonin, ondansetron (ZOFRAN) IV   I have personally reviewed following labs and imaging studies  LABORATORY DATA: CBC: Recent Labs  Lab 06/30/21 0454 07/01/21 1823 07/01/21 1839 07/02/21 0611 07/03/21 0510  WBC 6.1 4.9  --  13.2* 11.9*  NEUTROABS 5.4 4.7  --  10.9*  --   HGB 13.8 12.8* 12.6* 12.1* 12.0*  HCT 41.7 39.3 37.0* 36.4* 36.0*  MCV 93.7 93.6  --  93.6 94.5  PLT 234 192  --  193 205     Basic Metabolic Panel: Recent Labs  Lab 06/30/21 0454 07/01/21 1822 07/01/21 1823 07/01/21 1839 07/02/21 0611 07/03/21 0510 07/04/21 0149  NA 137  --  136 138 137 134* 134*  K 3.7  --  3.2* 3.3* 4.0 3.9 3.8  CL 105  --  101  --  102 99 101  CO2 23  --  21*  --  23 24 22   GLUCOSE 151*  --  136*  --  174* 162* 186*  BUN 23  --  26*  --  28* 36* 40*  CREATININE 1.39*  --  2.12*  --  1.84* 1.47* 1.26*  CALCIUM 8.7*  --  8.6*  --  8.5* 8.2* 8.2*  MG  --  <0.5*  --   --  2.9*  --   --   PHOS  --   --   --   --  5.3*  --   --      GFR: Estimated Creatinine Clearance: 52.8 mL/min (A) (by C-G formula based on SCr of 1.26 mg/dL (H)).  Liver Function Tests: Recent Labs  Lab 06/30/21 0454 07/01/21 1823 07/02/21 0611 07/03/21 0510  AST 15 27 31  37  ALT 13 20 21 22   ALKPHOS 67 147* 84 81  BILITOT 0.8 1.8* 1.5* 0.8  PROT 6.9 6.5 6.2* 6.7  ALBUMIN 3.5 3.0* 2.7* 2.7*    Recent Labs  Lab 06/30/21 0454  LIPASE 26    No results for input(s): AMMONIA in the last 168 hours.  Coagulation Profile: Recent Labs  Lab 07/01/21 1822  INR 1.8*     Cardiac Enzymes: No results for input(s): CKTOTAL, CKMB, CKMBINDEX, TROPONINI in the last 168 hours.  BNP (last 3 results) No results for input(s): PROBNP in the last 8760 hours.  Lipid Profile: No results for input(s): CHOL, HDL, LDLCALC, TRIG, CHOLHDL, LDLDIRECT in the last 72 hours.  Thyroid Function Tests: No results for input(s): TSH,  T4TOTAL, FREET4, T3FREE, THYROIDAB in the last 72 hours.  Anemia Panel: Recent Labs    07/02/21 0611 07/02/21 0834  FERRITIN 507* 615*     Urine analysis:    Component Value Date/Time   COLORURINE STRAW (A) 06/30/2021 0454   APPEARANCEUR CLEAR 06/30/2021 0454   LABSPEC 1.016 06/30/2021 0454   PHURINE 7.0 06/30/2021 0454   GLUCOSEU >=500 (A) 06/30/2021 0454   HGBUR NEGATIVE 06/30/2021 0454   BILIRUBINUR NEGATIVE 06/30/2021 0454   KETONESUR 5 (A) 06/30/2021 0454   PROTEINUR NEGATIVE 06/30/2021 0454   UROBILINOGEN 1.0 07/10/2013 1500   NITRITE NEGATIVE 06/30/2021 0454   LEUKOCYTESUR NEGATIVE 06/30/2021 0454    Sepsis Labs: Lactic Acid, Venous No results found for: LATICACIDVEN  MICROBIOLOGY: Recent Results (from the past 240 hour(s))  Resp Panel by RT-PCR (Flu A&B, Covid) Nasopharyngeal Swab     Status: Abnormal   Collection Time: 06/30/21  4:54 AM   Specimen: Nasopharyngeal Swab; Nasopharyngeal(NP) swabs in vial transport medium  Result Value Ref Range Status   SARS Coronavirus 2 by RT PCR POSITIVE (A) NEGATIVE Final    Comment: RESULT CALLED TO, READ BACK BY AND VERIFIED WITH: WHEATLEY, T. RN ON 06/30/21 @ 0614 BY MECIAL J. (NOTE) SARS-CoV-2 target nucleic acids are DETECTED.  The SARS-CoV-2 RNA is generally detectable in upper respiratory specimens during the acute phase of infection. Positive results are indicative of the presence of the identified virus, but do not rule out bacterial infection or co-infection with other pathogens not detected by the test. Clinical correlation with patient history and other diagnostic information is necessary to determine patient infection status. The expected result is Negative.  Fact Sheet for Patients: EntrepreneurPulse.com.au  Fact Sheet for Healthcare Providers: IncredibleEmployment.be  This test is not yet approved or cleared by the [place] and  has been authorized for  detection and/or diagnosis of SARS-CoV-2 by FDA under an Emergency Use Authorization (EUA).  This EUA will remain in effect (meaning th is test can be used) for the duration of  the COVID-19 declaration under Section 564(b)(1) of the Act, 21 U.S.C. section 360bbb-3(b)(1), unless the authorization is terminated or revoked sooner.     Influenza A by PCR NEGATIVE NEGATIVE Final   Influenza B by PCR NEGATIVE NEGATIVE Final    Comment: (NOTE) The Xpert Xpress SARS-CoV-2/FLU/RSV plus assay is intended as an aid in the diagnosis of influenza from Nasopharyngeal swab specimens and should not be used as a sole basis for treatment. Nasal washings and aspirates are unacceptable for Xpert Xpress SARS-CoV-2/FLU/RSV testing.  Fact Sheet for Patients: EntrepreneurPulse.com.au  Fact Sheet for Healthcare Providers: IncredibleEmployment.be  This test is not yet approved or cleared by the [place] FDA and has been authorized for detection and/or diagnosis of SARS-CoV-2 by FDA under an Emergency Use Authorization (EUA). This EUA will remain in effect (meaning this test can be used) for the duration of the COVID-19 declaration under Section 564(b)(1) of the Act, 21 U.S.C. section 360bbb-3(b)(1), unless the authorization is terminated or revoked.  Performed at [facility], [place] [address] [place], [place] [address]   Culture, blood (routine x 2)     Status: None (Preliminary result)   Collection Time: 07/02/21  8:34 AM   Specimen: BLOOD RIGHT HAND  Result Value Ref Range Status   Specimen Description BLOOD RIGHT HAND  Final   Special Requests   Final    BOTTLES DRAWN AEROBIC AND ANAEROBIC Blood Culture results may not be optimal due to an inadequate volume of blood received in culture bottles   Culture   Final    NO GROWTH 2 DAYS Performed at [facility], [place] [address] [place], [place] [address]    Report Status PENDING   Incomplete  Culture, blood (routine x 2)     Status: None (Preliminary result)   Collection Time: 07/02/21  8:34 AM   Specimen: BLOOD  Result Value Ref Range Status   Specimen Description BLOOD LEFT ANTECUBITAL  Final   Special Requests   Final    BOTTLES DRAWN AEROBIC AND ANAEROBIC Blood Culture adequate volume   Culture   Final    NO GROWTH 2 DAYS Performed at [facility], [place] [address], [place], [place] [address]    Report Status PENDING  Incomplete    RADIOLOGY STUDIES/RESULTS: DG CHEST PORT 1 VIEW  Result Date: 07/03/2021 CLINICAL DATA:  80 year old male with history of acute hypoxemic respiratory failure. EXAM: PORTABLE CHEST 1 VIEW COMPARISON:  Chest x-ray 07/01/2021. FINDINGS: Small right pleural effusion with atelectasis and/or consolidation in the right lung base. Left lung is clear. No left pleural effusion. No definite pneumothorax. Mild diffuse interstitial prominence, similar to prior studies. No cephalization of the pulmonary vasculature. Heart size is upper limits of normal. Upper mediastinal contours are distorted by patient positioning and lordotic projection. Atherosclerotic calcifications in the thoracic aorta. Electronic device projecting over the left hemithorax, presumably an implantable loop recorder. IMPRESSION: 1. Persistent small right pleural effusion with atelectasis and/or consolidation in the right lung base. 2. Aortic atherosclerosis. Electronically Signed   By: Vinnie Langton M.D.   On: 07/03/2021 05:48   VAS [name] LOWER EXTREMITY VENOUS (DVT)  Result Date: 07/03/2021  Lower Venous DVT Study Patient Name:  Ana Liaw [name]  Date of Exam:   07/02/2021 Medical Rec #: [id number]  Accession #:    [id number] Date of Birth: [birth year]              Patient Gender: M Patient Age:   30 years Exam Location:  [facility] Procedure:      VAS [name] LOWER EXTREMITY VENOUS (DVT) Referring Phys: Oren Binet  --------------------------------------------------------------------------------  Indications: Covid, elevated D-Dimer.  Comparison Study: No prior LEV on file Performing Technologist: BW  Examination Guidelines: A complete evaluation includes B-mode imaging, spectral Doppler, color Doppler, and power Doppler as needed of all accessible portions of each vessel. Bilateral testing is considered an integral part of a complete examination. Limited examinations for reoccurring indications may be performed as noted. The reflux portion of the exam is performed with the patient in reverse Trendelenburg.  +---------+---------------+---------+-----------+----------+-------------------+ RIGHT    CompressibilityPhasicitySpontaneityPropertiesThrombus Aging      +---------+---------------+---------+-----------+----------+-------------------+ CFV      Full                                         pulsatile waveforms +---------+---------------+---------+-----------+----------+-------------------+ SFJ      Full                                                             +---------+---------------+---------+-----------+----------+-------------------+ FV Prox  Full                                                             +---------+---------------+---------+-----------+----------+-------------------+ FV Mid   Full                                                             +---------+---------------+---------+-----------+----------+-------------------+ FV DistalFull                                                             +---------+---------------+---------+-----------+----------+-------------------+ PFV      Full                                                             +---------+---------------+---------+-----------+----------+-------------------+ POP      Full                                         pulsatile waveforms  +---------+---------------+---------+-----------+----------+-------------------+ PTV      Full                                                             +---------+---------------+---------+-----------+----------+-------------------+  PERO     Full                                                             +---------+---------------+---------+-----------+----------+-------------------+   +---------+---------------+---------+-----------+----------+-------------------+ LEFT     CompressibilityPhasicitySpontaneityPropertiesThrombus Aging      +---------+---------------+---------+-----------+----------+-------------------+ CFV      Full                                         pulsatile waveforms +---------+---------------+---------+-----------+----------+-------------------+ SFJ      Full                                                             +---------+---------------+---------+-----------+----------+-------------------+ FV Prox  Full                                                             +---------+---------------+---------+-----------+----------+-------------------+ FV Mid   Full                                                             +---------+---------------+---------+-----------+----------+-------------------+ FV DistalFull                                                             +---------+---------------+---------+-----------+----------+-------------------+ PFV      Full                                                             +---------+---------------+---------+-----------+----------+-------------------+ POP      Full                                         pulsatile waveforms +---------+---------------+---------+-----------+----------+-------------------+ PTV      Full                                                             +---------+---------------+---------+-----------+----------+-------------------+  PERO     Full                                                             +---------+---------------+---------+-----------+----------+-------------------+  Summary: BILATERAL: - No evidence of deep vein thrombosis seen in the lower extremities, bilaterally. -   *See table(s) above for measurements and observations. Electronically signed by Servando Snare MD on 07/03/2021 at 4:56:20 PM.    Final    ECHOCARDIOGRAM LIMITED  Result Date: 07/02/2021    ECHOCARDIOGRAM LIMITED REPORT   Patient Name:   Cordarious Zeek [name] Date of Exam: 07/02/2021 Medical Rec #:  [id number]              Height:       65.0 in Accession #:    [id number]             Weight:       200.0 lb Date of Birth:  11-06-1940             BSA:          1.978 m Patient Age:    47 years               BP:           121/59 mmHg Patient Gender: M                      HR:           75 bpm. Exam Location:  Inpatient Procedure: Limited Echo and Limited Color Doppler Indications:    Elevated Troponin  History:        Patient has prior history of Echocardiogram examinations, most                 recent 01/13/2020. CHF, Stroke, COPD and PAD, Arrythmias:Atrial                 Fibrillation; Risk Factors:Dyslipidemia and Hypertension.                 Covid-19 infection.  Sonographer:    Alvino Chapel RCS Referring Phys: Babs Bertin MD  Sonographer Comments: Technically challenging study due to limited acoustic windows and suboptimal apical window. Only a limited echo could be obtained with subcostal window being the best available. IMPRESSIONS  1. Limited study.  2. Left ventricular ejection fraction, by estimation, is 55 to 60%. The left ventricle has normal function. Left ventricular endocardial border not optimally defined to evaluate regional wall motion. There is moderate left ventricular hypertrophy.  3. Right ventricular systolic function is mildly reduced. The right ventricular size is normal. Tricuspid regurgitation signal is inadequate for  assessing PA pressure.  4. The mitral valve is abnormal. Trivial mitral valve regurgitation. Moderate mitral annular calcification.  5. Tricuspid valve regurgitation is mild to moderate.  6. The aortic valve was not well visualized. There is moderate calcification of the aortic valve.  7. The inferior vena cava is dilated in size with >50% respiratory variability, suggesting right atrial pressure of 8 mmHg. Comparison(s): Prior images reviewed side by side. LVEF remains normal range, unable to accurately evaluate regional wall motion. FINDINGS  Left Ventricle: Left ventricular ejection fraction, by estimation, is 55 to 60%. The left ventricle has normal function. Left ventricular endocardial border not optimally defined to evaluate regional wall motion. The left ventricular internal cavity size was normal in size. There is moderate left ventricular hypertrophy. Right Ventricle: The right ventricular size is normal. Right ventricular systolic function is mildly reduced. Tricuspid regurgitation signal is inadequate for assessing PA pressure. Mitral Valve: The mitral valve is abnormal. Moderate mitral annular calcification. Trivial mitral valve regurgitation. Tricuspid Valve: The tricuspid valve  is grossly normal. Tricuspid valve regurgitation is mild to moderate. Aortic Valve: The aortic valve was not well visualized. There is moderate calcification of the aortic valve. Venous: The inferior vena cava is dilated in size with greater than 50% respiratory variability, suggesting right atrial pressure of 8 mmHg. LEFT VENTRICLE PLAX 2D LVIDd:         4.00 cm LVIDs:         2.70 cm LV PW:         1.30 cm LV IVS:        1.30 cm LVOT diam:     1.80 cm LVOT Area:     2.54 cm   SHUNTS Systemic Diam: 1.80 cm Rozann Lesches MD Electronically signed by Rozann Lesches MD Signature Date/Time: 07/02/2021/5:07:31 PM    Final      LOS: 3 days   Oren Binet, MD  [facility]    To contact the attending provider  between 7A-7P or the covering provider during after hours 7P-7A, please log into the web site [email or web address] and access using universal [place] password for that web site. If you do not have the password, please call the hospital operator.  07/04/2021, 10:42 AM

## 2021-07-05 ENCOUNTER — Inpatient Hospital Stay (HOSPITAL_COMMUNITY): Payer: PPO

## 2021-07-05 DIAGNOSIS — J441 Chronic obstructive pulmonary disease with (acute) exacerbation: Secondary | ICD-10-CM | POA: Diagnosis not present

## 2021-07-05 DIAGNOSIS — U071 COVID-19: Secondary | ICD-10-CM | POA: Diagnosis not present

## 2021-07-05 DIAGNOSIS — J9601 Acute respiratory failure with hypoxia: Secondary | ICD-10-CM | POA: Diagnosis not present

## 2021-07-05 DIAGNOSIS — N179 Acute kidney failure, unspecified: Secondary | ICD-10-CM | POA: Diagnosis not present

## 2021-07-05 LAB — BODY FLUID CELL COUNT WITH DIFFERENTIAL
Eos, Fluid: 0 %
Lymphs, Fluid: 63 %
Monocyte-Macrophage-Serous Fluid: 3 % — ABNORMAL LOW (ref 50–90)
Neutrophil Count, Fluid: 34 % — ABNORMAL HIGH (ref 0–25)
Total Nucleated Cell Count, Fluid: 212 cu mm (ref 0–1000)

## 2021-07-05 LAB — LACTATE DEHYDROGENASE: LDH: 166 U/L (ref 98–192)

## 2021-07-05 LAB — CUP PACEART REMOTE DEVICE CHECK
Date Time Interrogation Session: 20220930011609
Implantable Pulse Generator Implant Date: 20200709

## 2021-07-05 LAB — LACTATE DEHYDROGENASE, PLEURAL OR PERITONEAL FLUID: LD, Fluid: 74 U/L — ABNORMAL HIGH (ref 3–23)

## 2021-07-05 LAB — PROTEIN, PLEURAL OR PERITONEAL FLUID: Total protein, fluid: <3 g/dL

## 2021-07-05 LAB — PROTEIN, TOTAL: Total Protein: 6.6 g/dL (ref 6.5–8.1)

## 2021-07-05 MED ORDER — PREDNISONE 20 MG PO TABS
40.0000 mg | ORAL_TABLET | Freq: Every day | ORAL | Status: DC
  Administered 2021-07-06: 40 mg via ORAL
  Filled 2021-07-05: qty 2

## 2021-07-05 NOTE — Progress Notes (Signed)
RT note. Patient refuse cpap tonight

## 2021-07-05 NOTE — Plan of Care (Signed)
  Problem: Activity: Goal: Risk for activity intolerance will decrease Outcome: Progressing   Problem: Coping: Goal: Level of anxiety will decrease Outcome: Progressing   Problem: Elimination: Goal: Will not experience complications related to bowel motility Outcome: Progressing   Problem: Safety: Goal: Ability to remain free from injury will improve Outcome: Progressing   Problem: Skin Integrity: Goal: Risk for impaired skin integrity will decrease Outcome: Progressing

## 2021-07-05 NOTE — Procedures (Signed)
Thoracentesis  Procedure Note  Troy [name]  [id number]  06/25/1941  Date:07/05/21  Time:10:57 AM   Provider Performing:Emberley Kral C Tamala Julian   Procedure: Thoracentesis with imaging guidance ([id number])  Indication(s) Pleural Effusion  Consent Risks of the procedure as well as the alternatives and risks of each were explained to the patient and/or caregiver.  Consent for the procedure was obtained and is signed in the bedside chart  Anesthesia Topical only with 1% lidocaine    Time Out Verified patient identification, verified procedure, site/side was marked, verified correct patient position, special equipment/implants available, medications/allergies/relevant history reviewed, required imaging and test results available.   Sterile Technique Maximal sterile technique including full sterile barrier drape, hand hygiene, sterile gown, sterile gloves, mask, hair covering, sterile ultrasound probe cover (if used).  Procedure Description Ultrasound was used to identify appropriate pleural anatomy for placement and overlying skin marked.  Area of drainage cleaned and draped in sterile fashion. Lidocaine was used to anesthetize the skin and subcutaneous tissue.  150 cc's of brown appearing fluid was drained from the right pleural space. Catheter then removed and bandaid applied to site.   Complications/Tolerance None; patient tolerated the procedure well. Chest X-ray is ordered to confirm no post-procedural complication.   EBL Minimal   Specimen(s) Pleural fluid

## 2021-07-05 NOTE — Progress Notes (Addendum)
PROGRESS NOTE        PATIENT DETAILS Name: Troy Redder [name] Age: 80 y.o. Sex: male Date of Birth: 1941/05/06 Admit Date: 07/01/2021 Admitting Physician Evalee Mutton Kristeen Mans, MD NWG:NFAOZ, Claudina Lick, MD  Brief Narrative: Patient is a 81 y.o. male with history of COPD, PAF, HFpEF, CKD stage IIIa-who was diagnosed with COVID-19 on 9/26 (home test)-presented with shortness of breath-felt to be due to COPD exacerbation in the setting of COVID-19 infection.  See below for further details.  Subjective: Feels much better-wants to go home.  Objective:  Vitals: Blood pressure (!) 156/71, pulse 67, temperature 97.8 F (36.6 C), temperature source Oral, resp. rate 16, height 6\' 1"  (1.854 m), weight 88.9 kg, SpO2 96 %.   Exam: Gen Exam:Alert awake-not in any distress HEENT:atraumatic, normocephalic Chest: B/L clear to auscultation anteriorly CVS:S1S2 regular Abdomen:soft non tender, non distended Extremities:no edema Neurology: Non focal Skin: no rash   Pertinent Labs/Radiology:  procalcitonin: 95.74>> 46.88>> 29.57  Recent Labs    07/03/21 0510  DDIMER 2.64*  CRP 22.2*    10/2>> blood culture: No growth  9/30>>CT angio chest/abdomen: No central PE, 3.7 cm AAA-no dissection-no acute abnormalities in abdomen. 10/02>> lower extremity Doppler: No DVT. 10/02>> Echo: EF 55-60%   Assessment and plan: Acute hypoxic respiratory failure due to COPD exacerbation: Provoked by COVID-19 infection-overall improved-continue bronchodilators and tapering steroids-continue to taper down FiO2.  Continue to mobilize.  Probable right lower lobe PNA: Afebrile-clinically improved-procalcitonin levels downtrending-remains on Rocephin/Zithromax.  Cultures negative so far.  Has a chronic right-sided pleural effusion-since unable to explain significantly high procalcitonin levels-have asked PCCM to evaluate for possible thoracocentesis.  COVID-19 infection: Completed a 5-day  course of Molnuparivir prior to this hospitalization-since no significant infiltrates on imaging-do not think patient requires COVID-19 treatment.  On steroids already for COPD exacerbation.  Elevated D-dimer: Likely related to COVID-19 infection-CTA chest/Dopplers negative for DVT.  Continue Eliquis.   AKI on CKD stage IIIa: AKI hemodynamically mediated-improved-close to baseline.  Check electrolytes periodically.  Minimal troponin elevation: Likely demand ischemia/AKI-trend is flat-not suggestive of ACS.  Supportive care.  PAF: Maintaining sinus rhythm-continue telemetry monitoring-on metoprolol and Eliquis.  HFpEF: Euvolemic on exam-continue metoprolol and Lasix.  HTN: BP controlled-continue metoprolol/amlodipine and Lasix.  Follow and adjust.   HLD: Continue statin  History of carotid artery stenosis-s/p right CEA  History of PAD-s/p fem pop bypass in 11/16  History of amiodarone induced thyrotoxicosis: Follows with endocrinology-no longer on methimazole-recent thyroid function tests were normal.  History of squamous cell cancer of the lung-s/p right lower lobe lobectomy-chronic right pleural effusion-has enlarging mediastinal lymphadenopathy as well-spoke with PCCM-for thoracocentesis today.  History of sigmoid adenocarcinoma-s/p surgical resection-appears to be in remission  History of lung nodule: Follows with Pulmonology in the outpatient setting  OSA: Resume CPAP nightly.  Procedures: None Consults: None DVT Prophylaxis: Eliquis Code Status:Full code  Family Communication: Daugher-[phone] updated over the phone 10/5  Time spent: 25 minutes-Greater than 50% of this time was spent in counseling, explanation of diagnosis, planning of further management, and coordination of care.  Diet: Diet Order             Diet regular Room service appropriate? Yes with Assist; Fluid consistency: Thin  Diet effective now  Disposition  Plan: Status is: Inpatient  Remains inpatient appropriate because:Inpatient level of care appropriate due to severity of illness  Dispo: The patient is from: Home              Anticipated d/c is to: Home              Patient currently is not medically stable to d/c.   Difficult to place patient No    Barriers to Discharge: On oxygen/IV antibiotics-not at baseline  Antimicrobial agents: Anti-infectives (From admission, onward)    Start     Dose/Rate Route Frequency Ordered Stop   07/03/21 1500  cefTRIAXone (ROCEPHIN) 2 g in sodium chloride 0.9 % 100 mL IVPB  Status:  Discontinued        2 g 200 mL/hr over 30 Minutes Intravenous Every 24 hours 07/02/21 1507 07/02/21 2032   07/02/21 2100  cefTRIAXone (ROCEPHIN) 2 g in sodium chloride 0.9 % 100 mL IVPB        2 g 200 mL/hr over 30 Minutes Intravenous Every 24 hours 07/02/21 2032     07/02/21 1000  cefTRIAXone (ROCEPHIN) 2 g in sodium chloride 0.9 % 100 mL IVPB  Status:  Discontinued        2 g 200 mL/hr over 30 Minutes Intravenous Every 24 hours 07/02/21 0949 07/02/21 1507   07/02/21 0945  azithromycin (ZITHROMAX) 500 mg in sodium chloride 0.9 % 250 mL IVPB        500 mg 250 mL/hr over 60 Minutes Intravenous Every 24 hours 07/02/21 0942          MEDICATIONS: Scheduled Meds:  amLODipine  5 mg Oral Daily   apixaban  5 mg Oral BID   brinzolamide  1 drop Both Eyes BID   And   brimonidine  1 drop Both Eyes BID   furosemide  60 mg Oral Daily   influenza vaccine adjuvanted  0.5 mL Intramuscular Tomorrow-1000   latanoprost  1 drop Both Eyes QHS   levalbuterol  2 puff Inhalation Q6H   methylPREDNISolone (SOLU-MEDROL) injection  60 mg Intravenous Q12H   metoprolol succinate  12.5 mg Oral Daily   montelukast  10 mg Oral QHS   pantoprazole  40 mg Oral Daily   rosuvastatin  40 mg Oral Daily   umeclidinium bromide  1 puff Inhalation Daily   Continuous Infusions:  azithromycin 500 mg (07/05/21 0846)   cefTRIAXone (ROCEPHIN)  IV 2  g (07/04/21 2129)   PRN Meds:.acetaminophen **OR** acetaminophen, benzonatate, levalbuterol, melatonin, ondansetron (ZOFRAN) IV   I have personally reviewed following labs and imaging studies  LABORATORY DATA: CBC: Recent Labs  Lab 06/30/21 0454 07/01/21 1823 07/01/21 1839 07/02/21 0611 07/03/21 0510  WBC 6.1 4.9  --  13.2* 11.9*  NEUTROABS 5.4 4.7  --  10.9*  --   HGB 13.8 12.8* 12.6* 12.1* 12.0*  HCT 41.7 39.3 37.0* 36.4* 36.0*  MCV 93.7 93.6  --  93.6 94.5  PLT 234 192  --  193 205     Basic Metabolic Panel: Recent Labs  Lab 06/30/21 0454 07/01/21 1822 07/01/21 1823 07/01/21 1839 07/02/21 0611 07/03/21 0510 07/04/21 0149  NA 137  --  136 138 137 134* 134*  K 3.7  --  3.2* 3.3* 4.0 3.9 3.8  CL 105  --  101  --  102 99 101  CO2 23  --  21*  --  23 24 22   GLUCOSE 151*  --  136*  --  174* 162*  186*  BUN 23  --  26*  --  28* 36* 40*  CREATININE 1.39*  --  2.12*  --  1.84* 1.47* 1.26*  CALCIUM 8.7*  --  8.6*  --  8.5* 8.2* 8.2*  MG  --  <0.5*  --   --  2.9*  --   --   PHOS  --   --   --   --  5.3*  --   --      GFR: Estimated Creatinine Clearance: 52.8 mL/min (A) (by C-G formula based on SCr of 1.26 mg/dL (H)).  Liver Function Tests: Recent Labs  Lab 06/30/21 0454 07/01/21 1823 07/02/21 0611 07/03/21 0510  AST 15 27 31  37  ALT 13 20 21 22   ALKPHOS 67 147* 84 81  BILITOT 0.8 1.8* 1.5* 0.8  PROT 6.9 6.5 6.2* 6.7  ALBUMIN 3.5 3.0* 2.7* 2.7*    Recent Labs  Lab 06/30/21 0454  LIPASE 26    No results for input(s): AMMONIA in the last 168 hours.  Coagulation Profile: Recent Labs  Lab 07/01/21 1822  INR 1.8*     Cardiac Enzymes: No results for input(s): CKTOTAL, CKMB, CKMBINDEX, TROPONINI in the last 168 hours.  BNP (last 3 results) No results for input(s): PROBNP in the last 8760 hours.  Lipid Profile: No results for input(s): CHOL, HDL, LDLCALC, TRIG, CHOLHDL, LDLDIRECT in the last 72 hours.  Thyroid Function Tests: No results for  input(s): TSH, T4TOTAL, FREET4, T3FREE, THYROIDAB in the last 72 hours.  Anemia Panel: No results for input(s): VITAMINB12, FOLATE, FERRITIN, TIBC, IRON, RETICCTPCT in the last 72 hours.   Urine analysis:    Component Value Date/Time   COLORURINE STRAW (A) 06/30/2021 0454   APPEARANCEUR CLEAR 06/30/2021 0454   LABSPEC 1.016 06/30/2021 0454   PHURINE 7.0 06/30/2021 0454   GLUCOSEU >=500 (A) 06/30/2021 0454   HGBUR NEGATIVE 06/30/2021 0454   BILIRUBINUR NEGATIVE 06/30/2021 0454   KETONESUR 5 (A) 06/30/2021 0454   PROTEINUR NEGATIVE 06/30/2021 0454   UROBILINOGEN 1.0 07/10/2013 1500   NITRITE NEGATIVE 06/30/2021 0454   LEUKOCYTESUR NEGATIVE 06/30/2021 0454    Sepsis Labs: Lactic Acid, Venous No results found for: LATICACIDVEN  MICROBIOLOGY: Recent Results (from the past 240 hour(s))  Resp Panel by RT-PCR (Flu A&B, Covid) Nasopharyngeal Swab     Status: Abnormal   Collection Time: 06/30/21  4:54 AM   Specimen: Nasopharyngeal Swab; Nasopharyngeal(NP) swabs in vial transport medium  Result Value Ref Range Status   SARS Coronavirus 2 by RT PCR POSITIVE (A) NEGATIVE Final    Comment: RESULT CALLED TO, READ BACK BY AND VERIFIED WITH: WHEATLEY, T. RN ON 06/30/21 @ 0614 BY MECIAL J. (NOTE) SARS-CoV-2 target nucleic acids are DETECTED.  The SARS-CoV-2 RNA is generally detectable in upper respiratory specimens during the acute phase of infection. Positive results are indicative of the presence of the identified virus, but do not rule out bacterial infection or co-infection with other pathogens not detected by the test. Clinical correlation with patient history and other diagnostic information is necessary to determine patient infection status. The expected result is Negative.  Fact Sheet for Patients: EntrepreneurPulse.com.au  Fact Sheet for Healthcare Providers: IncredibleEmployment.be  This test is not yet approved or cleared by the [place] FDA and  has been authorized for detection and/or diagnosis of SARS-CoV-2 by FDA under an Emergency Use Authorization (EUA).  This EUA will remain in effect (meaning th is test can be used) for the duration of  the COVID-19 declaration under Section 564(b)(1) of the Act, 21 U.S.C. section 360bbb-3(b)(1), unless the authorization is terminated or revoked sooner.     Influenza A by PCR NEGATIVE NEGATIVE Final   Influenza B by PCR NEGATIVE NEGATIVE Final    Comment: (NOTE) The Xpert Xpress SARS-CoV-2/FLU/RSV plus assay is intended as an aid in the diagnosis of influenza from Nasopharyngeal swab specimens and should not be used as a sole basis for treatment. Nasal washings and aspirates are unacceptable for Xpert Xpress SARS-CoV-2/FLU/RSV testing.  Fact Sheet for Patients: EntrepreneurPulse.com.au  Fact Sheet for Healthcare Providers: IncredibleEmployment.be  This test is not yet approved or cleared by the [place] FDA and has been authorized for detection and/or diagnosis of SARS-CoV-2 by FDA under an Emergency Use Authorization (EUA). This EUA will remain in effect (meaning this test can be used) for the duration of the COVID-19 declaration under Section 564(b)(1) of the Act, 21 U.S.C. section 360bbb-3(b)(1), unless the authorization is terminated or revoked.  Performed at [facility], [place] [address], [place], [place] [address]   Culture, blood (routine x 2)     Status: None (Preliminary result)   Collection Time: 07/02/21  8:34 AM   Specimen: BLOOD RIGHT HAND  Result Value Ref Range Status   Specimen Description BLOOD RIGHT HAND  Final   Special Requests   Final    BOTTLES DRAWN AEROBIC AND ANAEROBIC Blood Culture results may not be optimal due to an inadequate volume of blood received in culture bottles   Culture   Final    NO GROWTH 3 DAYS Performed at [facility], [place] [address] [place],  [place] [address]    Report Status PENDING  Incomplete  Culture, blood (routine x 2)     Status: None (Preliminary result)   Collection Time: 07/02/21  8:34 AM   Specimen: BLOOD  Result Value Ref Range Status   Specimen Description BLOOD LEFT ANTECUBITAL  Final   Special Requests   Final    BOTTLES DRAWN AEROBIC AND ANAEROBIC Blood Culture adequate volume   Culture   Final    NO GROWTH 3 DAYS Performed at [facility], [place] [address], [place], [place] [address]    Report Status PENDING  Incomplete    RADIOLOGY STUDIES/RESULTS: No results found.   LOS: 4 days   Oren Binet, MD  [facility]    To contact the attending provider between 7A-7P or the covering provider during after hours 7P-7A, please log into the web site [email or web address] and access using universal [place] password for that web site. If you do not have the password, please call the hospital operator.  07/05/2021, 10:24 AM

## 2021-07-05 NOTE — Plan of Care (Signed)

## 2021-07-05 NOTE — Progress Notes (Signed)
Physical Therapy Treatment Patient Details Name: Troy Cai [name] MRN: [id number] DOB: 01/22/1941 Today's Date: 07/05/2021   History of Present Illness 80 y.o. male presenting to [facility] ED with SOB and productive cough x several days. Positive home COVID-19 test 9/26. Patient admitted with severe COVID-19 infection, acute COPD exacerbation, acute hypoxic respiratory failure and AKI. PMHx significant for CHF, A-fib, COPD, PAF, CKD IIIa and HLD.    PT Comments    Pt with improved mobility despite limitation to room due to being COVID+. Pt with impaired vision at baseline but compensates well with it. Pt was able to ambulate 60' in room with RW. Pt agreed the walker to be beneficial at this time and stated he would use his rollator at home. Daughter present and states she will be return home with him and staying with him for awhile. Pt functioning at supervision min guard level and maintained SPO2 >91% on RA during ambulation.Recommend HHPT as pt with improved mobility, has a good home set up and support. Acute PT to cont to follow.   Recommendations for follow up therapy are one component of a multi-disciplinary discharge planning process, led by the attending physician.  Recommendations may be updated based on patient status, additional functional criteria and insurance authorization.  Follow Up Recommendations  Home health PT;Supervision/Assistance - 24 hour (daughter able to stay with patient)     Equipment Recommendations  Rolling walker with 5" wheels (pt states he has a rollator which would suffice)    Recommendations for Other Services       Precautions / Restrictions Precautions Precautions: Fall Precaution Comments: monitor O2 sats; legally blind in R eye; low vision in L eye Restrictions Weight Bearing Restrictions: No     Mobility  Bed Mobility Overal bed mobility: Needs Assistance Bed Mobility: Supine to Sit     Supine to sit: Supervision     General bed mobility  comments: HOB elevated, no use of bed rails    Transfers Overall transfer level: Needs assistance Equipment used: Rolling walker (2 wheeled) Transfers: Sit to/from Stand Sit to Stand: Min guard         General transfer comment: min guard due to impaired vision, verbal directional cues to push up from bed not pull up on walker  Ambulation/Gait Ambulation/Gait assistance: Min guard Gait Distance (Feet): 60 Feet (10x6 in room (to door and back)) Assistive device: Rolling walker (2 wheeled) Gait Pattern/deviations: Step-through pattern;Decreased stride length;Trunk flexed Gait velocity: reduced Gait velocity interpretation: <1.31 ft/sec, indicative of household ambulator General Gait Details: pt SpO2 >91% on RA, noted SOB/wheezing, verbal directional cues, min guard for safety due to impaired vision in Pensions consultant    Modified Rankin (Stroke Patients Only)       Balance Overall balance assessment: Needs assistance Sitting-balance support: Feet supported Sitting balance-Leahy Scale: Good     Standing balance support: Bilateral upper extremity supported Standing balance-Leahy Scale: Fair Standing balance comment: pt requiring RW for safe ambulation at this time                            Cognition Arousal/Alertness: Awake/alert Behavior During Therapy: WFL for tasks assessed/performed Overall Cognitive Status: Within Functional Limits for tasks assessed  General Comments: Daughter present at bedside.      Exercises      General Comments        Pertinent Vitals/Pain Pain Assessment: No/denies pain    Home Living                      Prior Function            PT Goals (current goals can now be found in the care plan section) Acute Rehab PT Goals Patient Stated Goal: go home today PT Goal Formulation: With patient Time For Goal  Achievement: 07/17/21 Potential to Achieve Goals: Good Progress towards PT goals: Progressing toward goals    Frequency    Min 3X/week      PT Plan Discharge plan needs to be updated    Co-evaluation              AM-PAC PT "6 Clicks" Mobility   Outcome Measure  Help needed turning from your back to your side while in a flat bed without using bedrails?: A Little Help needed moving from lying on your back to sitting on the side of a flat bed without using bedrails?: A Little Help needed moving to and from a bed to a chair (including a wheelchair)?: A Little Help needed standing up from a chair using your arms (e.g., wheelchair or bedside chair)?: A Little Help needed to walk in hospital room?: A Little Help needed climbing 3-5 steps with a railing? : A Little 6 Click Score: 18    End of Session Equipment Utilized During Treatment: Gait belt Activity Tolerance: Patient limited by fatigue Patient left: with call Troy/phone within reach;in chair;with family/visitor present Nurse Communication: Mobility status PT Visit Diagnosis: Unsteadiness on feet (R26.81);Muscle weakness (generalized) (M62.81);Difficulty in walking, not elsewhere classified (R26.2);Adult, failure to thrive (R62.7)     Time: 1700-1749 PT Time Calculation (min) (ACUTE ONLY): 29 min  Charges:  $Gait Training: 23-37 mins                     Kittie Plater, PT, DPT Acute Rehabilitation Services Pager #: [phone] Office #: [phone]    Berline Lopes 07/05/2021, 1:27 PM

## 2021-07-06 ENCOUNTER — Other Ambulatory Visit (HOSPITAL_COMMUNITY): Payer: Self-pay

## 2021-07-06 DIAGNOSIS — J441 Chronic obstructive pulmonary disease with (acute) exacerbation: Secondary | ICD-10-CM | POA: Diagnosis not present

## 2021-07-06 DIAGNOSIS — U071 COVID-19: Secondary | ICD-10-CM | POA: Diagnosis not present

## 2021-07-06 DIAGNOSIS — N179 Acute kidney failure, unspecified: Secondary | ICD-10-CM | POA: Diagnosis not present

## 2021-07-06 DIAGNOSIS — J9601 Acute respiratory failure with hypoxia: Secondary | ICD-10-CM | POA: Diagnosis not present

## 2021-07-06 LAB — CYTOLOGY - NON PAP

## 2021-07-06 MED ORDER — PREDNISONE 10 MG PO TABS
ORAL_TABLET | ORAL | 0 refills | Status: DC
  Filled 2021-07-06: qty 10, 4d supply, fill #0

## 2021-07-06 MED ORDER — SPIRIVA HANDIHALER 18 MCG IN CAPS
18.0000 ug | ORAL_CAPSULE | Freq: Every day | RESPIRATORY_TRACT | 0 refills | Status: DC
  Filled 2021-07-06: qty 30, 30d supply, fill #0

## 2021-07-06 MED ORDER — ALBUTEROL SULFATE HFA 108 (90 BASE) MCG/ACT IN AERS
2.0000 | INHALATION_SPRAY | Freq: Four times a day (QID) | RESPIRATORY_TRACT | 0 refills | Status: DC | PRN
  Filled 2021-07-06: qty 8.5, 25d supply, fill #0

## 2021-07-06 MED ORDER — BENZONATATE 200 MG PO CAPS
200.0000 mg | ORAL_CAPSULE | Freq: Three times a day (TID) | ORAL | 0 refills | Status: DC | PRN
  Filled 2021-07-06: qty 18, 6d supply, fill #0

## 2021-07-06 MED ORDER — CEFDINIR 300 MG PO CAPS
300.0000 mg | ORAL_CAPSULE | Freq: Two times a day (BID) | ORAL | 0 refills | Status: AC
  Filled 2021-07-06: qty 4, 2d supply, fill #0

## 2021-07-06 NOTE — Progress Notes (Signed)
Occupational Therapy Treatment Patient Details Name: Troy [name]. MRN: [id number] DOB: 08/30/1941 Today's Date: 07/06/2021   History of present illness 80 y.o. male presenting to [facility] ED with SOB and productive cough x several days. Positive home COVID-19 test 9/26. Patient admitted with severe COVID-19 infection, acute COPD exacerbation, acute hypoxic respiratory failure and AKI. PMHx significant for CHF, A-fib, COPD, PAF, CKD IIIa and HLD.   OT comments  Patient with good progress toward patient focused goals.  Patient is probably close to baseline given vision deficits and new environment.  He is hoping to go home soon with prior level of supports.  [place] OT can be considered if the patient agrees, if not he does have adequate oversight and assistance.     Recommendations for follow up therapy are one component of a multi-disciplinary discharge planning process, led by the attending physician.  Recommendations may be updated based on patient status, additional functional criteria and insurance authorization.    Follow Up Recommendations  Home health OT    Equipment Recommendations  None recommended by OT    Recommendations for Other Services      Precautions / Restrictions Precautions Precautions: Fall Precaution Comments: legally blind in R eye; low vision in L eye. Patient refusing tele Restrictions Weight Bearing Restrictions: No       Mobility Bed Mobility Overal bed mobility: Needs Assistance Bed Mobility: Supine to Sit     Supine to sit: Supervision       Patient Response: Flat affect  Transfers Overall transfer level: Needs assistance Equipment used: Rolling walker (2 wheeled) Transfers: Sit to/from [employer] Transfers Sit to Stand: Supervision Stand pivot transfers: Supervision            Balance Overall balance assessment: Needs assistance Sitting-balance support: Feet supported Sitting balance-Leahy Scale: Good     Standing balance  support: Bilateral upper extremity supported Standing balance-Leahy Scale: Fair                             ADL either performed or assessed with clinical judgement   ADL Overall ADL's : Needs assistance/impaired     Grooming: Supervision/safety;Standing   Upper Body Bathing: Set up;Standing   Lower Body Bathing: Sit to/from stand;Set up   Upper Body Dressing : Set up;Sitting   Lower Body Dressing: Sit to/from stand;Supervision/safety   Toilet Transfer: Supervision/safety;RW;Cueing for safety   Toileting- Clothing Manipulation and Hygiene: Minimal assistance;Sit to/from stand       Functional mobility during ADLs: Supervision/safety;Rolling walker;Cueing for safety                         Cognition Arousal/Alertness: Awake/alert Behavior During Therapy: WFL for tasks assessed/performed Overall Cognitive Status: Within Functional Limits for tasks assessed                                           Pertinent Vitals/ Pain       Pain Assessment: No/denies pain                                                          Frequency  Min  2X/week        Progress Toward Goals  OT Goals(current goals can now be found in the care plan section)  Progress towards OT goals: Progressing toward goals  Acute Rehab OT Goals Patient Stated Goal: go home today OT Goal Formulation: With patient Time For Goal Achievement: 07/18/21 Potential to Achieve Goals: Good  Plan      Co-evaluation                 AM-PAC OT "6 Clicks" Daily Activity     Outcome Measure   Help from another person eating meals?: None Help from another person taking care of personal grooming?: None Help from another person toileting, which includes using toliet, bedpan, or urinal?: A Little Help from another person bathing (including washing, rinsing, drying)?: A Little Help from another person to put on and taking off regular upper  body clothing?: A Little Help from another person to put on and taking off regular lower body clothing?: A Little 6 Click Score: 20    End of Session Equipment Utilized During Treatment: Rolling walker  OT Visit Diagnosis: Muscle weakness (generalized) (M62.81);History of falling (Z91.81);Low vision, both eyes (H54.2)   Activity Tolerance Patient tolerated treatment well   Patient Left in chair;with call bell/phone within reach   Nurse Communication          Time: 7614-7092 OT Time Calculation (min): 32 min  Charges: OT General Charges $OT Visit: 1 Visit OT Treatments $Self Care/Home Management : 23-37 mins  07/06/2021  RP, OTR/L  Acute Rehabilitation Services  Office:  [phone]   Metta Clines 07/06/2021, 9:12 AM

## 2021-07-06 NOTE — Progress Notes (Signed)
Patient refused Cardiac monitoring.

## 2021-07-06 NOTE — Discharge Summary (Addendum)
PATIENT DETAILS Name: Troy Mclaren [name] Age: 80 y.o. Sex: male Date of Birth: 01-16-1941 MRN: [id number]. Admitting Physician: Jonetta Osgood, MD SEG:BTDVV, Claudina Lick, MD  Admit Date: 07/01/2021 Discharge date: 07/06/2021  Recommendations for Outpatient Follow-up:  Follow up with PCP in 1-2 weeks Please obtain CMP/CBC in one week Please follow pleural fluid cytology-pending at the time of discharge. Please ensure follow-up with pulmonology  Admitted From:  Home  Disposition: Home with home health services   Home Health: Yes  Equipment/Devices: None  Discharge Condition: Stable  CODE STATUS: FULL CODE  Diet recommendation:  Diet Order             Diet - low sodium heart healthy           Diet regular Room service appropriate? Yes with Assist; Fluid consistency: Thin  Diet effective now                    Brief Summary: Patient is a 80 y.o. male with history of COPD, PAF, HFpEF, CKD stage IIIa-who was diagnosed with COVID-19 on 9/26 (home test)-presented with shortness of breath-felt to be due to COPD exacerbation in the setting of COVID-19 infection.  See below for further details.  Brief Hospital Course: Acute hypoxic respiratory failure due to COPD exacerbation: Provoked by COVID-19 infection-overall improved-usual bronchodilator regimen-have added Spiriva-we will be on tapering steroids on discharge.  Follow-up with PCP/primary pulmonology for further optimization.  Probable right lower lobe PNA: Afebrile-clinically improved-procalcitonin levels downtrending-treated with Rocephin/Zithromax.  Pleural fluid culture/blood cultures negative so far.  We will continue on oral antibiotics for another 1-2 days.    COVID-19 infection: Completed a 5-day course of Molnuparivir prior to this hospitalization-since no significant infiltrates on imaging-do not think patient requires COVID-19 treatment.  On steroids already for COPD exacerbation.   Elevated  D-dimer: Likely related to COVID-19 infection-CTA chest/Dopplers negative for DVT.  Continue Eliquis.    AKI on CKD stage IIIa: AKI hemodynamically mediated-improved-close to baseline.  Check electrolytes periodically.   Minimal troponin elevation: Likely demand ischemia/AKI-trend is flat-not suggestive of ACS.  Supportive care.   PAF: Maintaining sinus rhythm-continue telemetry monitoring-on metoprolol and Eliquis.   HFpEF: Euvolemic on exam-continue metoprolol and Lasix.   HTN: BP controlled-continue metoprolol/amlodipine/ARB and Lasix on discharge.  Follow with PCP for further optimization.   HLD: Continue statin   History of carotid artery stenosis-s/p right CEA   History of PAD-s/p fem pop bypass in 11/16   History of amiodarone induced thyrotoxicosis: Follows with endocrinology-no longer on methimazole-recent thyroid function tests were normal.   History of squamous cell cancer of the lung-s/p right lower lobe lobectomy-chronic right pleural effusion-CT imaging of the chest shows enlarging mediastinal lymphadenopathy-underwent thoracocentesis on 10/5-appears to be transudate-cytology pending.  Spoke with PCCM MD on 10/6-Dr. Geralyn Corwin will  follow cytology results.   History of sigmoid adenocarcinoma-s/p surgical resection-appears to be in remission   History of lung nodule: Follows with Pulmonology in the outpatient setting   OSA: Resume CPAP nightly.  Deconditioning/debility: Refusing SNF-going home with maximal home health services.  Procedures 10/5>> right thoracocentesis  Discharge Diagnoses:  Principal Problem:   COVID-19 virus infection Active Problems:   PAF (paroxysmal atrial fibrillation) (HCC)   Acute respiratory failure with hypoxia (HCC)   SOB (shortness of breath)   AKI (acute kidney injury) ([place])   Hypomagnesemia   COPD with acute exacerbation (HCC)   Hypokalemia   Elevated troponin   Hyperlipidemia   Acute hypoxemic  respiratory failure due to  COVID-19 [facility])   Discharge Instructions:  Activity:  As tolerated with Full fall precautions use walker/cane & assistance as needed   Discharge Instructions     Diet - low sodium heart healthy   Complete by: As directed    Discharge instructions   Complete by: As directed    Follow with Primary MD  Binnie Rail, MD in 1-2 weeks  Follow-up with your pulmonologist-Dr. June Leap in the next 2 weeks.  Please get a complete blood count and chemistry panel checked by your Primary MD at your next visit, and again as instructed by your Primary MD.  Get Medicines reviewed and adjusted: Please take all your medications with you for your next visit with your Primary MD  Laboratory/radiological data: Please request your Primary MD to go over all hospital tests and procedure/radiological results at the follow up, please ask your Primary MD to get all Hospital records sent to his/her office.  In some cases, they will be blood work, cultures and biopsy results pending at the time of your discharge. Please request that your primary care M.D. follows up on these results.  Also Note the following: If you experience worsening of your admission symptoms, develop shortness of breath, life threatening emergency, suicidal or homicidal thoughts you must seek medical attention immediately by calling 911 or calling your MD immediately  if symptoms less severe.  You must read complete instructions/literature along with all the possible adverse reactions/side effects for all the Medicines you take and that have been prescribed to you. Take any new Medicines after you have completely understood and accpet all the possible adverse reactions/side effects.   Do not drive when taking Pain medications or sleeping medications (Benzodaizepines)  Do not take more than prescribed Pain, Sleep and Anxiety Medications. It is not advisable to combine anxiety,sleep and pain medications without talking with your  primary care practitioner  Special Instructions: If you have smoked or chewed Tobacco  in the last 2 yrs please stop smoking, stop any regular Alcohol  and or any Recreational drug use.  Wear Seat belts while driving.  Please note: You were cared for by a hospitalist during your hospital stay. Once you are discharged, your primary care physician will handle any further medical issues. Please note that NO REFILLS for any discharge medications will be authorized once you are discharged, as it is imperative that you return to your primary care physician (or establish a relationship with a primary care physician if you do not have one) for your post hospital discharge needs so that they can reassess your need for medications and monitor your lab values.   1.)  Pleural fluid cytology is pending-please ask your primary care practitioner/primary pulmonologist to follow-up on the results.  2.)  Final cultures from pleural fluid is still pending-please ask your primary care practitioner/primary pulmonologist to follow-up on the results.   Increase activity slowly   Complete by: As directed       Allergies as of 07/06/2021       Reactions   Niacin-lovastatin Er Shortness Of Breath, Other (See Comments)   Dyspnea, flushing   Penicillins Hives, Shortness Of Breath, Other (See Comments)   Flushing  & dyspnea Because of a history of documented adverse serious drug reaction;Medi Alert bracelet  is recommended PCN reaction causing immediate rash, facial/tongue/throat swelling, SOB or lightheadedness with hypotension: Yes PCN reaction causing severe rash involving mucus membranes or skin necrosis: No PCN reaction occurring within  the last 10 years: NO PCN reaction that required hospitalization: NO   Sulfonamide Derivatives Hives, Shortness Of Breath, Other (See Comments)   Flushing & dyspnea Because of a history of documented adverse serious drug reaction;Medi Alert bracelet  is recommended    Atorvastatin Other (See Comments)   Myalgias & athralgias- takes Crestor, DOES NOT LIKE LIPITOR        Medication List     STOP taking these medications    molnupiravir EUA 200 mg Caps capsule Commonly known as: LAGEVRIO       TAKE these medications    acetaminophen 325 MG tablet Commonly known as: TYLENOL Take 650 mg by mouth every 6 (six) hours as needed for moderate pain or headache.   albuterol 108 (90 Base) MCG/ACT inhaler Commonly known as: VENTOLIN HFA Inhale 2 puffs into the lungs every 6 (six) hours as needed for wheezing or shortness of breath.   amLODipine 5 MG tablet Commonly known as: NORVASC TAKE ONE TABLET ONCE DAILY,   benzonatate 200 MG capsule Commonly known as: TESSALON Take 1 capsule (200 mg total) by mouth 3 (three) times daily as needed for cough.   cefdinir 300 MG capsule Commonly known as: OMNICEF Take 1 capsule (300 mg total) by mouth 2 (two) times daily for 2 days.   CENTRUM SILVER PO Take 1 tablet by mouth daily.   docusate sodium 100 MG capsule Commonly known as: COLACE Take 100 mg by mouth 2 (two) times daily.   Eliquis 5 MG Tabs tablet Generic drug: apixaban TAKE ONE TABLET BY MOUTH TWICE DAILY. What changed: how much to take   empagliflozin 10 MG Tabs tablet Commonly known as: Jardiance Take 1 tablet (10 mg total) by mouth daily before breakfast.   escitalopram 10 MG tablet Commonly known as: LEXAPRO TAKE ONE TABLET ONCE DAILY   furosemide 20 MG tablet Commonly known as: LASIX Take 3 tablets (60 mg total) by mouth 2 (two) times daily.   latanoprost 0.005 % ophthalmic solution Commonly known as: XALATAN Place 1 drop into both eyes at bedtime.   losartan 25 MG tablet Commonly known as: COZAAR Take 1 tablet (25 mg total) by mouth daily.   Melatonin 10 MG Caps Take 10 mg by mouth at bedtime.   metoprolol succinate 25 MG 24 hr tablet Commonly known as: TOPROL-XL TAKE 1/2 TABLET IN THE EVENING WITH OR IMMEDIATELY  FOLLOWING A MEAL What changed: See the new instructions.   montelukast 10 MG tablet Commonly known as: SINGULAIR TAKE ONE TABLET AT BEDTIME. What changed: when to take this   omeprazole 20 MG capsule Commonly known as: PRILOSEC TAKE (1) CAPSULE TWICE DAILY. What changed: See the new instructions.   potassium chloride SA 20 MEQ tablet Commonly known as: KLOR-CON TAKE ONE TABLET ONCE DAILY. What changed: how to take this   predniSONE 10 MG tablet Commonly known as: DELTASONE Take 40 mg daily for 1 day, 30 mg daily for 1 day, 20 mg daily for 1 days,10 mg daily for 1 day, then stop   rosuvastatin 40 MG tablet Commonly known as: CRESTOR TAKE ONE TABLET ONCE DAILY   senna 8.6 MG tablet Commonly known as: SENOKOT Take 1 tablet by mouth 2 (two) times daily.   Simbrinza 1-0.2 % Susp Generic drug: Brinzolamide-Brimonidine Place 1 drop into both eyes 2 times daily.   Spiriva HandiHaler 18 MCG inhalation capsule Generic drug: tiotropium Place 1 capsule (18 mcg total) into inhaler and inhale daily.   Symbicort 160-4.5 MCG/ACT inhaler  Generic drug: budesonide-formoterol USE 2 PUFFS TWICE DAILY. What changed: See the new instructions.        Follow-up Information     Binnie Rail, MD. Schedule an appointment as soon as possible for a visit in 1 week(s).   Specialty: Internal Medicine Contact information: [place] [address] [phone]         Thompson Grayer, MD Follow up in 1 month(s).   Specialty: Cardiology Contact information: [place] [address] [phone]         June Leap L, DO Follow up in 2 week(s).   Specialty: Pulmonary Disease Why: Hospital follow up Contact information: [address] [place] [address] [place] [address] [phone]                Allergies  Allergen Reactions   Niacin-Lovastatin Er Shortness Of Breath and Other (See Comments)    Dyspnea, flushing   Penicillins  Hives, Shortness Of Breath and Other (See Comments)    Flushing  & dyspnea Because of a history of documented adverse serious drug reaction;Medi Alert bracelet  is recommended PCN reaction causing immediate rash, facial/tongue/throat swelling, SOB or lightheadedness with hypotension: Yes PCN reaction causing severe rash involving mucus membranes or skin necrosis: No PCN reaction occurring within the last 10 years: NO PCN reaction that required hospitalization: NO   Sulfonamide Derivatives Hives, Shortness Of Breath and Other (See Comments)    Flushing & dyspnea Because of a history of documented adverse serious drug reaction;Medi Alert bracelet  is recommended   Atorvastatin Other (See Comments)    Myalgias & athralgias- takes Crestor, DOES NOT LIKE LIPITOR      Consultations: PCCM   Other Procedures/Studies: DG Chest Port 1 View  Result Date: 07/05/2021 CLINICAL DATA:  Shortness of breath EXAM: PORTABLE CHEST 1 VIEW COMPARISON:  Chest x-ray dated July 03, 2021 FINDINGS: Cardiac and mediastinal contours are unchanged. Small right pleural effusion is likely increased in size compared to prior exam. Increased right basilar atelectasis. No new parenchymal process. IMPRESSION: Small right pleural effusion and right basilar atelectasis, slightly increased compared with prior radiograph. Electronically Signed   By: Yetta Glassman M.D.   On: 07/05/2021 12:15   DG CHEST PORT 1 VIEW  Result Date: 07/03/2021 CLINICAL DATA:  80 year old male with history of acute hypoxemic respiratory failure. EXAM: PORTABLE CHEST 1 VIEW COMPARISON:  Chest x-ray 07/01/2021. FINDINGS: Small right pleural effusion with atelectasis and/or consolidation in the right lung base. Left lung is clear. No left pleural effusion. No definite pneumothorax. Mild diffuse interstitial prominence, similar to prior studies. No cephalization of the pulmonary vasculature. Heart size is upper limits of normal. Upper mediastinal  contours are distorted by patient positioning and lordotic projection. Atherosclerotic calcifications in the thoracic aorta. Electronic device projecting over the left hemithorax, presumably an implantable loop recorder. IMPRESSION: 1. Persistent small right pleural effusion with atelectasis and/or consolidation in the right lung base. 2. Aortic atherosclerosis. Electronically Signed   By: Vinnie Langton M.D.   On: 07/03/2021 05:48   DG Chest Port 1 View  Result Date: 07/01/2021 CLINICAL DATA:  COVID.  Shortness of breath. EXAM: PORTABLE CHEST 1 VIEW COMPARISON:  CT angiogram chest and chest x-ray 06/30/2021. FINDINGS: Small right pleural effusion is unchanged. There is linear atelectasis or scarring in the right lower lung similar to the prior study. There is no new focal lung infiltrate. There is no pneumothorax. The cardiomediastinal silhouette is within normal  limits for projection. No acute fractures are seen. IMPRESSION: 1. Stable right pleural effusion with right lower lung scarring. 2. No new lung infiltrate. Electronically Signed   By: Ronney Asters M.D.   On: 07/01/2021 18:46   DG Chest Portable 1 View  Result Date: 06/30/2021 CLINICAL DATA:  Chest and abdominal pain.  COVID. EXAM: PORTABLE CHEST 1 VIEW COMPARISON:  04/21/2020 FINDINGS: Normal heart size and mediastinal contours. Implantable cardiac device. Chronic scarring and loculated pleural effusion at the right base. Generalized interstitial coarsening is also stable. No pulmonary edema or air leak. Extensive artifact from EKG leads. IMPRESSION: Baseline appearance of the chest when compared to 2021. Electronically Signed   By: Jorje Guild M.D.   On: 06/30/2021 05:45   CUP PACEART REMOTE DEVICE CHECK  Result Date: 07/05/2021 ILR summary report received. Battery status OK. Normal device function. No new symptom, tachy, brady, or pause episodes. 3 AF episodes, longest duration 22min, mean HR 85.  Burden 0.1%, known PAF, Eliquis  prescribed. Monthly summary reports and ROV/PRN LR  CT Angio Chest/Abd/Pel for Dissection W and/or Wo Contrast  Result Date: 06/30/2021 CLINICAL DATA:  Abdominal pain, chest pain, nausea, concern for dissection or aneurysm, history of lung cancer status post right lower lobectomy 2012 EXAM: CT ANGIOGRAPHY CHEST, ABDOMEN AND PELVIS TECHNIQUE: Non-contrast CT of the chest was initially obtained. Multidetector CT imaging through the chest, abdomen and pelvis was performed using the standard protocol during bolus administration of intravenous contrast. Multiplanar reconstructed images and MIPs were obtained and reviewed to evaluate the vascular anatomy. CONTRAST:  73mL OMNIPAQUE IOHEXOL 350 MG/ML SOLN COMPARISON:  01/13/2020, 01/29/2020, 02/08/2021 FINDINGS: CTA CHEST FINDINGS Cardiovascular: Thoracic aortic atherosclerosis without significant aneurysm or dissection. Patent three-vessel arch anatomy with mild tortuosity. No mediastinal hemorrhage or hematoma. No significant central proximal hilar pulmonary embolus. Motion artifact through the lower lobe segmental branches. Normal heart size. No pericardial effusion. Native coronary atherosclerosis. Mediastinum/Nodes: No significant thyroid abnormality. Trachea and central airways are patent. Esophagus nondilated. No hiatal hernia. Continued enlargement of the high right paratracheal lymph node now measuring 1.8 cm in short axis, previously 1.2 cm. Left paratracheal lymph nodes are also larger measuring 1.3 cm, previously 0.8 cm. No supraclavicular or axillary adenopathy. Lungs/Pleura: Limited with motion artifact. Background emphysema. Patchy ground-glass attenuation compatible with air trapping. Slight anterior septal thickening in the apices. Postop changes from right lower lobectomy. Similar pleuroparenchymal nodular bandlike scarring in the residual right upper lobe and right middle lobe. Slight increase in the right effusion. Chronic right pleural  thickening/rind. Trace amount of fluid within the residual fissure on the right side. Overall stable left lung aeration. No new acute airspace process or pneumonia. Musculoskeletal: Degenerative change noted of the spine. No acute or significant osseous finding. No compression fracture. Sternum intact. Review of the MIP images confirms the above findings. CTA ABDOMEN AND PELVIS FINDINGS VASCULAR Aorta: Atherosclerotic changes of the abdominal aorta. Infrarenal aorta has a fusiform atherosclerotic aneurysm measuring 3.7 cm, previously 3.4 cm. No acute aortic finding. Negative for dissection. Celiac: Atherosclerotic origin but remains patent including its branches SMA: Atherosclerotic origin with ostial narrowing but remains patent in the central mesentery Renals: Atherosclerotic origins with some degree of renal artery stenosis suspected bilaterally. Difficult to truly assess by CTA. Left renal atrophy noted. IMA: Similar proximal occlusion as before. Inflow: Heavily calcified pelvic iliac vessels without acute vascular finding or iliac occlusion. 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 No acute vascular process. Veins: Dedicated venous phase imaging not  performed Review of the MIP images confirms the above findings. NON-VASCULAR Hepatobiliary: Arterial phase imaging only. Increased hepatic attenuation along the gallbladder fossa, posterior right liver, and right inferior liver margin may represent vascular shunting or transient hepatic attenuation defects. Limited assessment by single phase arterial imaging. No definite focal abnormality. Cholelithiasis again noted. Gallbladder nondistended. Common bile duct nondilated. No biliary obstruction pattern. Pancreas: No focal pancreatic abnormality or surrounding inflammatory process. Pancreatic duct nondilated. Spleen: Normal in size without focal abnormality. Adrenals/Urinary Tract: Normal adrenal glands. Chronic left renal atrophy suggesting  chronic renal vascular disease. No focal renal abnormality. No acute process or obstruction. Ureters are symmetric and decompressed. No hydroureter. Bladder unremarkable. Stomach/Bowel: Negative for bowel obstruction, significant dilatation, ileus, or free air. Normal appendix in the right hemipelvis. Postop changes of the distal colon. No free fluid, fluid collection, hemorrhage, hematoma, abscess or ascites. No peritoneal nodularity. Lymphatic: No abdominopelvic bulky adenopathy. Reproductive: No significant finding by CT. Other: No abdominal wall hernia or abnormality. No abdominopelvic ascites. Musculoskeletal: Postop changes in the left inguinal region. No recurrent hernia. Stable osteopenia and degenerative changes. No acute osseous finding. Review of the MIP images confirms the above findings. 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 Negative for acute dissection. Recommend follow-up ultrasound every 2 years. This recommendation follows ACR consensus guidelines: White Paper of the ACR Incidental Findings Committee II on Vascular Findings. J Am Coll Radiol 2013; 10:789-794. Negative for significant acute pulmonary embolus. Stable postop changes of the right hemithorax from right lower lobectomy with similar right inferior lung bandlike and nodular pleuroparenchymal scarring as well as right effusion with diffuse right pleural thickening. Continued slight progression of right and left paratracheal adenopathy remaining indeterminate. This still could be infectious/inflammatory or reactive. Consider follow-up surveillance PET scan for further evaluation. Cholelithiasis No other acute intra-abdominal or pelvic finding. Electronically Signed   By: Jerilynn Mages.  Shick M.D.   On: 06/30/2021 09:26   VAS [name] LOWER EXTREMITY VENOUS (DVT)  Result Date: 07/03/2021  Lower Venous DVT Study Patient Name:  Troy Aull [name]  Date of Exam:   07/02/2021 Medical Rec #: [id number]               Accession #:     [id number] Date of Birth: 1940/12/24              Patient Gender: M Patient Age:   74 years Exam Location:  [facility] Procedure:      VAS [name] LOWER EXTREMITY VENOUS (DVT) Referring Phys: Oren Binet --------------------------------------------------------------------------------  Indications: Covid, elevated D-Dimer.  Comparison Study: No prior LEV on file Performing Technologist: BW  Examination Guidelines: A complete evaluation includes B-mode imaging, spectral Doppler, color Doppler, and power Doppler as needed of all accessible portions of each vessel. Bilateral testing is considered an integral part of a complete examination. Limited examinations for reoccurring indications may be performed as noted. The reflux portion of the exam is performed with the patient in reverse Trendelenburg.  +---------+---------------+---------+-----------+----------+-------------------+ RIGHT    CompressibilityPhasicitySpontaneityPropertiesThrombus Aging      +---------+---------------+---------+-----------+----------+-------------------+ CFV      Full                                         pulsatile waveforms +---------+---------------+---------+-----------+----------+-------------------+ SFJ      Full                                                             +---------+---------------+---------+-----------+----------+-------------------+  FV Prox  Full                                                             +---------+---------------+---------+-----------+----------+-------------------+ FV Mid   Full                                                             +---------+---------------+---------+-----------+----------+-------------------+ FV DistalFull                                                             +---------+---------------+---------+-----------+----------+-------------------+ PFV      Full                                                              +---------+---------------+---------+-----------+----------+-------------------+ POP      Full                                         pulsatile waveforms +---------+---------------+---------+-----------+----------+-------------------+ PTV      Full                                                             +---------+---------------+---------+-----------+----------+-------------------+ PERO     Full                                                             +---------+---------------+---------+-----------+----------+-------------------+   +---------+---------------+---------+-----------+----------+-------------------+ LEFT     CompressibilityPhasicitySpontaneityPropertiesThrombus Aging      +---------+---------------+---------+-----------+----------+-------------------+ CFV      Full                                         pulsatile waveforms +---------+---------------+---------+-----------+----------+-------------------+ SFJ      Full                                                             +---------+---------------+---------+-----------+----------+-------------------+ FV Prox  Full                                                             +---------+---------------+---------+-----------+----------+-------------------+  FV Mid   Full                                                             +---------+---------------+---------+-----------+----------+-------------------+ FV DistalFull                                                             +---------+---------------+---------+-----------+----------+-------------------+ PFV      Full                                                             +---------+---------------+---------+-----------+----------+-------------------+ POP      Full                                         pulsatile waveforms +---------+---------------+---------+-----------+----------+-------------------+  PTV      Full                                                             +---------+---------------+---------+-----------+----------+-------------------+ PERO     Full                                                             +---------+---------------+---------+-----------+----------+-------------------+     Summary: BILATERAL: - No evidence of deep vein thrombosis seen in the lower extremities, bilaterally. -   *See table(s) above for measurements and observations. Electronically signed by Servando Snare MD on 07/03/2021 at 4:56:20 PM.    Final    ECHOCARDIOGRAM LIMITED  Result Date: 07/02/2021    ECHOCARDIOGRAM LIMITED REPORT   Patient Name:   Troy Limbaugh [name] Date of Exam: 07/02/2021 Medical Rec #:  [id number]              Height:       65.0 in Accession #:    [id number]             Weight:       200.0 lb Date of Birth:  30-Oct-1940             BSA:          1.978 m Patient Age:    87 years               BP:           121/59 mmHg Patient Gender: M                      HR:  75 bpm. Exam Location:  Inpatient Procedure: Limited Echo and Limited Color Doppler Indications:    Elevated Troponin  History:        Patient has prior history of Echocardiogram examinations, most                 recent 01/13/2020. CHF, Stroke, COPD and PAD, Arrythmias:Atrial                 Fibrillation; Risk Factors:Dyslipidemia and Hypertension.                 Covid-19 infection.  Sonographer:    Alvino Chapel RCS Referring Phys: Babs Bertin MD  Sonographer Comments: Technically challenging study due to limited acoustic windows and suboptimal apical window. Only a limited echo could be obtained with subcostal window being the best available. IMPRESSIONS  1. Limited study.  2. Left ventricular ejection fraction, by estimation, is 55 to 60%. The left ventricle has normal function. Left ventricular endocardial border not optimally defined to evaluate regional wall motion. There is moderate left ventricular  hypertrophy.  3. Right ventricular systolic function is mildly reduced. The right ventricular size is normal. Tricuspid regurgitation signal is inadequate for assessing PA pressure.  4. The mitral valve is abnormal. Trivial mitral valve regurgitation. Moderate mitral annular calcification.  5. Tricuspid valve regurgitation is mild to moderate.  6. The aortic valve was not well visualized. There is moderate calcification of the aortic valve.  7. The inferior vena cava is dilated in size with >50% respiratory variability, suggesting right atrial pressure of 8 mmHg. Comparison(s): Prior images reviewed side by side. LVEF remains normal range, unable to accurately evaluate regional wall motion. FINDINGS  Left Ventricle: Left ventricular ejection fraction, by estimation, is 55 to 60%. The left ventricle has normal function. Left ventricular endocardial border not optimally defined to evaluate regional wall motion. The left ventricular internal cavity size was normal in size. There is moderate left ventricular hypertrophy. Right Ventricle: The right ventricular size is normal. Right ventricular systolic function is mildly reduced. Tricuspid regurgitation signal is inadequate for assessing PA pressure. Mitral Valve: The mitral valve is abnormal. Moderate mitral annular calcification. Trivial mitral valve regurgitation. Tricuspid Valve: The tricuspid valve is grossly normal. Tricuspid valve regurgitation is mild to moderate. Aortic Valve: The aortic valve was not well visualized. There is moderate calcification of the aortic valve. Venous: The inferior vena cava is dilated in size with greater than 50% respiratory variability, suggesting right atrial pressure of 8 mmHg. LEFT VENTRICLE PLAX 2D LVIDd:         4.00 cm LVIDs:         2.70 cm LV PW:         1.30 cm LV IVS:        1.30 cm LVOT diam:     1.80 cm LVOT Area:     2.54 cm   SHUNTS Systemic Diam: 1.80 cm Rozann Lesches MD Electronically signed by Rozann Lesches MD  Signature Date/Time: 07/02/2021/5:07:31 PM    Final    US Abdomen Limited RUQ (LIVER/GB)  Result Date: 06/30/2021 CLINICAL DATA:  Right-sided abdominal pain EXAM: ULTRASOUND ABDOMEN LIMITED RIGHT UPPER QUADRANT COMPARISON:  None. FINDINGS: Gallbladder: No gallstones or wall thickening visualized. No sonographic Murphy sign noted by sonographer. Common bile duct: Diameter: 9 mm, possibly normal for age. Where visualized, no filling defect. Liver: No focal lesion identified. Within normal limits in parenchymal echogenicity. Portal vein is patent on color Doppler imaging with normal direction of blood  flow towards the liver. IMPRESSION: 1. No cholecystitis. 2. Upper normal common bile duct diameter, please correlate with pending liver labs. 3. There was a gallstone by PET-CT 02/08/2021 which is not visualized today. Electronically Signed   By: Jorje Guild M.D.   On: 06/30/2021 05:35     TODAY-DAY OF DISCHARGE:  Subjective:   Troy [name] today has no headache,no chest abdominal pain,no new weakness tingling or numbness, feels much better wants to go home today.  Objective:   Blood pressure (!) 177/71, pulse (!) 58, temperature 98.1 F (36.7 C), temperature source Oral, resp. rate 18, height 6\' 1"  (1.854 m), weight 88.9 kg, SpO2 95 %.  Intake/Output Summary (Last 24 hours) at 07/06/2021 1014 Last data filed at 07/06/2021 0542 Gross per 24 hour  Intake --  Output 600 ml  Net -600 ml   Filed Weights   07/01/21 1746  Weight: 88.9 kg    Exam: Awake Alert, Oriented *3, No new F.N deficits, Normal affect [place].AT,PERRAL Supple Neck,No JVD, No cervical lymphadenopathy appriciated.  Symmetrical Chest wall movement, Good air movement bilaterally, CTAB RRR,No Gallops,Rubs or new Murmurs, No Parasternal Heave +ve B.Sounds, Abd Soft, Non tender, No organomegaly appriciated, No rebound -guarding or rigidity. No Cyanosis, Clubbing or edema, No new Rash or bruise   PERTINENT RADIOLOGIC STUDIES: DG  Chest Port 1 View  Result Date: 07/05/2021 CLINICAL DATA:  Shortness of breath EXAM: PORTABLE CHEST 1 VIEW COMPARISON:  Chest x-ray dated July 03, 2021 FINDINGS: Cardiac and mediastinal contours are unchanged. Small right pleural effusion is likely increased in size compared to prior exam. Increased right basilar atelectasis. No new parenchymal process. IMPRESSION: Small right pleural effusion and right basilar atelectasis, slightly increased compared with prior radiograph. Electronically Signed   By: Yetta Glassman M.D.   On: 07/05/2021 12:15     PERTINENT LAB RESULTS: CBC: No results for input(s): WBC, HGB, HCT, PLT in the last 72 hours. CMET CMP     Component Value Date/Time   NA 134 (L) 07/04/2021 0149   NA 142 05/16/2020 1615   NA 142 03/07/2016 1213   K 3.8 07/04/2021 0149   K 4.5 03/07/2016 1213   CL 101 07/04/2021 0149   CO2 22 07/04/2021 0149   CO2 25 03/07/2016 1213   GLUCOSE 186 (H) 07/04/2021 0149   GLUCOSE 85 03/07/2016 1213   BUN 40 (H) 07/04/2021 0149   BUN 17 05/16/2020 1615   BUN 12.5 03/07/2016 1213   CREATININE 1.26 (H) 07/04/2021 0149   CREATININE 1.38 (H) 02/09/2021 1136   CREATININE 1.62 (H) 09/20/2016 1141   CREATININE 1.2 03/07/2016 1213   CALCIUM 8.2 (L) 07/04/2021 0149   CALCIUM 9.8 03/07/2016 1213   PROT 6.6 07/05/2021 1436   PROT 7.7 03/07/2016 1213   PROT 6.8 03/07/2016 1213   ALBUMIN 2.7 (L) 07/03/2021 0510   ALBUMIN 3.2 (L) 03/07/2016 1213   AST 37 07/03/2021 0510   AST 17 09/04/2019 1113   AST 18 03/07/2016 1213   ALT 22 07/03/2021 0510   ALT 15 09/04/2019 1113   ALT 23 03/07/2016 1213   ALKPHOS 81 07/03/2021 0510   ALKPHOS 75 03/07/2016 1213   BILITOT 0.8 07/03/2021 0510   BILITOT 0.6 09/04/2019 1113   BILITOT 0.50 03/07/2016 1213   GFRNONAA 58 (L) 07/04/2021 0149   GFRNONAA 52 (L) 02/09/2021 1136   GFRAA 74 05/16/2020 1615   GFRAA >60 01/14/2020 1001    GFR Estimated Creatinine Clearance: 52.8 mL/min (A) (by C-G formula based  on SCr of 1.26 mg/dL (H)). No results for input(s): LIPASE, AMYLASE in the last 72 hours. No results for input(s): CKTOTAL, CKMB, CKMBINDEX, TROPONINI in the last 72 hours. Invalid input(s): POCBNP No results for input(s): DDIMER in the last 72 hours. No results for input(s): HGBA1C in the last 72 hours. No results for input(s): CHOL, HDL, LDLCALC, TRIG, CHOLHDL, LDLDIRECT in the last 72 hours. No results for input(s): TSH, T4TOTAL, T3FREE, THYROIDAB in the last 72 hours.  Invalid input(s): FREET3 No results for input(s): VITAMINB12, FOLATE, FERRITIN, TIBC, IRON, RETICCTPCT in the last 72 hours. Coags: No results for input(s): INR in the last 72 hours.  Invalid input(s): PT Microbiology: Recent Results (from the past 240 hour(s))  Resp Panel by RT-PCR (Flu A&B, Covid) Nasopharyngeal Swab     Status: Abnormal   Collection Time: 06/30/21  4:54 AM   Specimen: Nasopharyngeal Swab; Nasopharyngeal(NP) swabs in vial transport medium  Result Value Ref Range Status   SARS Coronavirus 2 by RT PCR POSITIVE (A) NEGATIVE Final    Comment: RESULT CALLED TO, READ BACK BY AND VERIFIED WITH: WHEATLEY, T. RN ON 06/30/21 @ 0614 BY MECIAL J. (NOTE) SARS-CoV-2 target nucleic acids are DETECTED.  The SARS-CoV-2 RNA is generally detectable in upper respiratory specimens during the acute phase of infection. Positive results are indicative of the presence of the identified virus, but do not rule out bacterial infection or co-infection with other pathogens not detected by the test. Clinical correlation with patient history and other diagnostic information is necessary to determine patient infection status. The expected result is Negative.  Fact Sheet for Patients: EntrepreneurPulse.com.au  Fact Sheet for Healthcare Providers: IncredibleEmployment.be  This test is not yet approved or cleared by the [place] FDA and  has been authorized for detection and/or  diagnosis of SARS-CoV-2 by FDA under an Emergency Use Authorization (EUA).  This EUA will remain in effect (meaning th is test can be used) for the duration of  the COVID-19 declaration under Section 564(b)(1) of the Act, 21 U.S.C. section 360bbb-3(b)(1), unless the authorization is terminated or revoked sooner.     Influenza A by PCR NEGATIVE NEGATIVE Final   Influenza B by PCR NEGATIVE NEGATIVE Final    Comment: (NOTE) The Xpert Xpress SARS-CoV-2/FLU/RSV plus assay is intended as an aid in the diagnosis of influenza from Nasopharyngeal swab specimens and should not be used as a sole basis for treatment. Nasal washings and aspirates are unacceptable for Xpert Xpress SARS-CoV-2/FLU/RSV testing.  Fact Sheet for Patients: EntrepreneurPulse.com.au  Fact Sheet for Healthcare Providers: IncredibleEmployment.be  This test is not yet approved or cleared by the [place] FDA and has been authorized for detection and/or diagnosis of SARS-CoV-2 by FDA under an Emergency Use Authorization (EUA). This EUA will remain in effect (meaning this test can be used) for the duration of the COVID-19 declaration under Section 564(b)(1) of the Act, 21 U.S.C. section 360bbb-3(b)(1), unless the authorization is terminated or revoked.  Performed at [facility], [place] [address], [place], [place] [address]   Culture, blood (routine x 2)     Status: None (Preliminary result)   Collection Time: 07/02/21  8:34 AM   Specimen: BLOOD RIGHT HAND  Result Value Ref Range Status   Specimen Description BLOOD RIGHT HAND  Final   Special Requests   Final    BOTTLES DRAWN AEROBIC AND ANAEROBIC Blood Culture results may not be optimal due to an inadequate volume of blood received in culture bottles  Culture   Final    NO GROWTH 4 DAYS Performed at [facility], [place] [address], [place], [place] [address]    Report Status PENDING  Incomplete  Culture,  blood (routine x 2)     Status: None (Preliminary result)   Collection Time: 07/02/21  8:34 AM   Specimen: BLOOD  Result Value Ref Range Status   Specimen Description BLOOD LEFT ANTECUBITAL  Final   Special Requests   Final    BOTTLES DRAWN AEROBIC AND ANAEROBIC Blood Culture adequate volume   Culture   Final    NO GROWTH 4 DAYS Performed at [facility], [place] [address], [place], [place] [address]    Report Status PENDING  Incomplete  Body fluid culture w Gram Stain     Status: None (Preliminary result)   Collection Time: 07/05/21 10:45 AM   Specimen: Pleural Fluid  Result Value Ref Range Status   Specimen Description FLUID  Final   Special Requests  RIGHT PLEURAL  Final   Gram Stain   Final    MODERATE WBC PRESENT,BOTH PMN AND MONONUCLEAR NO ORGANISMS SEEN    Culture   Final    NO GROWTH < 24 HOURS Performed at [facility], [place] [address], [place], [place] [address]    Report Status PENDING  Incomplete    FURTHER DISCHARGE INSTRUCTIONS:  Get Medicines reviewed and adjusted: Please take all your medications with you for your next visit with your Primary MD  Laboratory/radiological data: Please request your Primary MD to go over all hospital tests and procedure/radiological results at the follow up, please ask your Primary MD to get all Hospital records sent to his/her office.  In some cases, they will be blood work, cultures and biopsy results pending at the time of your discharge. Please request that your primary care M.D. goes through all the records of your hospital data and follows up on these results.  Also Note the following: If you experience worsening of your admission symptoms, develop shortness of breath, life threatening emergency, suicidal or homicidal thoughts you must seek medical attention immediately by calling 911 or calling your MD immediately  if symptoms less severe.  You must read complete instructions/literature along with all the possible  adverse reactions/side effects for all the Medicines you take and that have been prescribed to you. Take any new Medicines after you have completely understood and accpet all the possible adverse reactions/side effects.   Do not drive when taking Pain medications or sleeping medications (Benzodaizepines)  Do not take more than prescribed Pain, Sleep and Anxiety Medications. It is not advisable to combine anxiety,sleep and pain medications without talking with your primary care practitioner  Special Instructions: If you have smoked or chewed Tobacco  in the last 2 yrs please stop smoking, stop any regular Alcohol  and or any Recreational drug use.  Wear Seat belts while driving.  Please note: You were cared for by a hospitalist during your hospital stay. Once you are discharged, your primary care physician will handle any further medical issues. Please note that NO REFILLS for any discharge medications will be authorized once you are discharged, as it is imperative that you return to your primary care physician (or establish a relationship with a primary care physician if you do not have one) for your post hospital discharge needs so that they can reassess your need for medications and monitor your lab values.  Total Time spent coordinating discharge including counseling,  education and face to face time equals 35 minutes.  Signed: Jae Skeet 07/06/2021 10:14 AM

## 2021-07-06 NOTE — TOC Transition Note (Signed)
Transition of Care [facility]) - CM/SW Discharge Note   Patient Details  Name: Troy Eland [name] MRN: [id number] Date of Birth: [birth year]  [facility] CM/SW Contact:  Angelita Ingles, RN Phone Number:[phone]  07/06/2021, 12:22 PM   Clinical Narrative:    CM spoke with daughter to follow up on choice for [facility]. HH has been arranged with [facility]. No other needs noted at this time. TOC will sign off.    Final next level of care: [place] (and private caregivers) Barriers to Discharge: No Barriers Identified   Patient Goals and CMS Choice Patient states their goals for this hospitalization and ongoing recovery are:: Per daughter patient states that he wants to go home CMS Medicare.gov Compare Post Acute Care list provided to:: Patient Choice offered to / list presented to : Adult Children, Patient  Discharge Placement                       Discharge Plan and Services In-house Referral: NA Discharge Planning Services: CM Consult Post Acute Care Choice: Home Health          DME Arranged: N/A DME Agency: NA       HH Arranged: RN, PT, OT [place] Agency: [place] ([facility]) Date HH Agency Contacted: 07/06/21 Time [place]: 1222 Representative spoke with at [place]: [place] ([place]) Interventions     Readmission Risk Interventions Readmission Risk Prevention Plan 07/04/2021 07/04/2021  Transportation Screening Complete Complete  Medication Review Press photographer) Referral to Pharmacy Referral to Pharmacy  PCP or Specialist appointment within 3-5 days of discharge Complete Complete  HRI or Home Care Consult Complete Complete  SW Recovery Care/Counseling Consult Complete -  Stamps Patient Refused -  Some recent data might be hidden

## 2021-07-07 DIAGNOSIS — Z87891 Personal history of nicotine dependence: Secondary | ICD-10-CM | POA: Diagnosis not present

## 2021-07-07 DIAGNOSIS — M4800 Spinal stenosis, site unspecified: Secondary | ICD-10-CM | POA: Diagnosis not present

## 2021-07-07 DIAGNOSIS — F32A Depression, unspecified: Secondary | ICD-10-CM | POA: Diagnosis not present

## 2021-07-07 DIAGNOSIS — J9601 Acute respiratory failure with hypoxia: Secondary | ICD-10-CM | POA: Diagnosis not present

## 2021-07-07 DIAGNOSIS — I480 Paroxysmal atrial fibrillation: Secondary | ICD-10-CM | POA: Diagnosis not present

## 2021-07-07 DIAGNOSIS — I6523 Occlusion and stenosis of bilateral carotid arteries: Secondary | ICD-10-CM | POA: Diagnosis not present

## 2021-07-07 DIAGNOSIS — N400 Enlarged prostate without lower urinary tract symptoms: Secondary | ICD-10-CM | POA: Diagnosis not present

## 2021-07-07 DIAGNOSIS — N1831 Chronic kidney disease, stage 3a: Secondary | ICD-10-CM | POA: Diagnosis not present

## 2021-07-07 DIAGNOSIS — Z7901 Long term (current) use of anticoagulants: Secondary | ICD-10-CM | POA: Diagnosis not present

## 2021-07-07 DIAGNOSIS — G4733 Obstructive sleep apnea (adult) (pediatric): Secondary | ICD-10-CM | POA: Diagnosis not present

## 2021-07-07 DIAGNOSIS — I739 Peripheral vascular disease, unspecified: Secondary | ICD-10-CM | POA: Diagnosis not present

## 2021-07-07 DIAGNOSIS — Z7952 Long term (current) use of systemic steroids: Secondary | ICD-10-CM | POA: Diagnosis not present

## 2021-07-07 DIAGNOSIS — J439 Emphysema, unspecified: Secondary | ICD-10-CM | POA: Diagnosis not present

## 2021-07-07 DIAGNOSIS — Z7951 Long term (current) use of inhaled steroids: Secondary | ICD-10-CM | POA: Diagnosis not present

## 2021-07-07 DIAGNOSIS — I130 Hypertensive heart and chronic kidney disease with heart failure and stage 1 through stage 4 chronic kidney disease, or unspecified chronic kidney disease: Secondary | ICD-10-CM | POA: Diagnosis not present

## 2021-07-07 DIAGNOSIS — U071 COVID-19: Secondary | ICD-10-CM | POA: Diagnosis not present

## 2021-07-07 DIAGNOSIS — I5032 Chronic diastolic (congestive) heart failure: Secondary | ICD-10-CM | POA: Diagnosis not present

## 2021-07-07 DIAGNOSIS — Z9181 History of falling: Secondary | ICD-10-CM | POA: Diagnosis not present

## 2021-07-07 DIAGNOSIS — M1990 Unspecified osteoarthritis, unspecified site: Secondary | ICD-10-CM | POA: Diagnosis not present

## 2021-07-07 DIAGNOSIS — E785 Hyperlipidemia, unspecified: Secondary | ICD-10-CM | POA: Diagnosis not present

## 2021-07-07 LAB — CULTURE, BLOOD (ROUTINE X 2)
Culture: NO GROWTH
Culture: NO GROWTH
Special Requests: ADEQUATE

## 2021-07-08 LAB — BODY FLUID CULTURE W GRAM STAIN: Culture: NO GROWTH

## 2021-07-10 NOTE — Progress Notes (Signed)
Subjective:    Patient ID: Troy Kindred [name], male    DOB: 1941-03-20, 80 y.o.   MRN: [id number]  This visit occurred during the SARS-CoV-2 public health emergency.  Safety protocols were in place, including screening questions prior to the visit, additional usage of staff PPE, and extensive cleaning of exam room while observing appropriate contact time as indicated for disinfecting solutions.     HPI The patient is here for hospital follow up  Admitted 10/1 - 10/6  Diagnosed with covid on 9/26 and presented with SOB felt to be due to covid.   Acute hypoxic resp failure due to COPD exac -  Provoked by covid infection.    Home bronchodilator regimen, added spiriva Imrpoved Steroid taper upon d/c F/u with pulm  Probable RLL PNA: Afebrile Clnically improved Procalcitonin level downtrending Treated with rocphein/zithromax Pleural fluid cx/blood cx negative  Completed abx  Covid 19 infection: Completed 5 days of molnuparivir prior to hospitalization  Elevated d-dmer: Shaune Leeks from covid 19 CTA chest/ Leg dopplers negative for DVT Cotinue eliquis  AKIn on CKD stage IIIa -  Improved - close to baseline on d/c  Minimal troponin elevateion: Likely demand ischemia/ AKI Trend was flat  PAF: Maintained sinus rhythm Metoprolol, eliquis  HFpEF: Euvolemic Continued metoprolol, lasix  Htn: Controlled Continued metoprolol/amlodipine/ARB and lasix  HLD: Statin continued  H/o squamous cell cancer of lung s/p RLL lobectomy - chronic right pleural effusion: Enlarging mediastinal lymphadenoapthy S/p thoracocentesis on 10/5 Cytology - inflammatory Has b/u with Dr Valeta Harms  OSA: on cpap  Deconditioning: Refused SNF - d/c'd to home  He states good days and bad days.  He feels short of breath some days depending on the activity he is doing.  Overall he feels like he is better.  He does feel lightheaded at times, but has no other concerns.  He is eating well.  His  sleep is good with the melatonin.  He denies any depression or anxiety.  Medications and allergies reviewed with patient and updated if appropriate.  Patient Active Problem List   Diagnosis Date Noted   Acute respiratory failure with hypoxia ([place]) 07/02/2021   SOB (shortness of breath) 07/02/2021   AKI (acute kidney injury) ([place]) 07/02/2021   Hypomagnesemia 07/02/2021   COPD with acute exacerbation ([place]) 07/02/2021   Hypokalemia 07/02/2021   Elevated troponin 07/02/2021   Acute hypoxemic respiratory failure due to COVID-19 ([place]) 07/02/2021   Hyperlipidemia    COVID-19 virus infection 07/01/2021   Chronic anticoagulation 10/18/2020   Hx of adenomatous colonic polyps 10/18/2020   History of gastric polyps 10/18/2020   Iron deficiency 10/18/2020   History of anemia 10/18/2020   Gastric intestinal metaplasia 10/18/2020   Aortic atherosclerosis ([place]) 08/01/2020   Secondary hypercoagulable state ([place]) 03/22/2020   Personal history of colon cancer 01/08/2020   Epigastric pain 01/08/2020   Abnormal findings on esophagogastroduodenoscopy (EGD) 01/08/2020   Calculus of gallbladder without cholecystitis without obstruction 01/08/2020   Vertigo 11/25/2019   Cancer of sigmoid colon ([place]) 12/12/2018   Pleural effusion on right 07/15/2018   Colon adenocarcinoma ([place]) 06/30/2018   Acute GI bleeding 06/23/2018   Depression 05/06/2018   Syncope 01/29/2018   Chronic diastolic CHF (congestive heart failure) ([place]) 05/04/2017   CAD (coronary artery disease) 05/03/2017   MGUS (monoclonal gammopathy of unknown significance) 03/07/2016   Benign essential HTN 01/19/2016   OSA (obstructive sleep apnea) 08/29/2015   PAF (paroxysmal atrial fibrillation) ([place])    Glaucoma 06/22/2015  DOE (dyspnea on exertion) 02/04/2015   Memory loss, short term 08/03/2014   Occlusion and stenosis of carotid artery without mention of cerebral infarction 07/09/2013   COPD GOLD II  07/09/2013   Chronic renal disease,  stage 3, moderately decreased glomerular filtration rate between 30-59 mL/min/1.73 square meter ([place]) 07/06/2013   TIA (transient ischemic attack) 07/06/2013   Basal cell carcinoma of skin 10/10/2012   History of lung cancer 03/11/2012   PAD (peripheral artery disease) ([place]) 12/16/2011   AAA (abdominal aortic aneurysm) ([place]) 09/27/2011   Squamous cell lung cancer ([place]) 11/08/2010   GAIT IMBALANCE 03/02/2009   Prediabetes 03/02/2009   HYPERPLASIA PROSTATE UNS W/O UR OBST & OTH LUTS 01/15/2008   HYPERLIPIDEMIA 09/29/2007   Peripheral vascular disease ([place]) 09/29/2007   LEUKOPLAKIA, VOCAL CORDS 09/29/2007   FEMORAL BRUIT 09/29/2007    Current Outpatient Medications on File Prior to Visit  Medication Sig Dispense Refill   acetaminophen (TYLENOL) 325 MG tablet Take 650 mg by mouth every 6 (six) hours as needed for moderate pain or headache.     albuterol (VENTOLIN HFA) 108 (90 Base) MCG/ACT inhaler Inhale 2 puffs into the lungs every 6 (six) hours as needed for wheezing or shortness of breath. 8.5 g 0   amLODipine (NORVASC) 5 MG tablet TAKE ONE TABLET ONCE DAILY, (Patient taking differently: Take 5 mg by mouth daily.) 60 tablet 2   benzonatate (TESSALON) 200 MG capsule Take 1 capsule (200 mg total) by mouth 3 (three) times daily as needed for cough. 20 capsule 0   Brinzolamide-Brimonidine (SIMBRINZA) 1-0.2 % SUSP Place 1 drop into both eyes 2 times daily.     docusate sodium (COLACE) 100 MG capsule Take 100 mg by mouth 2 (two) times daily.      ELIQUIS 5 MG TABS tablet TAKE ONE TABLET BY MOUTH TWICE DAILY. (Patient taking differently: Take 5 mg by mouth 2 (two) times daily.) 120 tablet 3   empagliflozin (JARDIANCE) 10 MG TABS tablet Take 1 tablet (10 mg total) by mouth daily before breakfast. 30 tablet 11   escitalopram (LEXAPRO) 10 MG tablet TAKE ONE TABLET ONCE DAILY (Patient taking differently: Take 10 mg by mouth daily.) 28 tablet 5   furosemide (LASIX) 20 MG tablet Take 3 tablets (60 mg  total) by mouth 2 (two) times daily. 180 tablet 11   latanoprost (XALATAN) 0.005 % ophthalmic solution Place 1 drop into both eyes at bedtime.     losartan (COZAAR) 25 MG tablet Take 1 tablet (25 mg total) by mouth daily. 90 tablet 3   Melatonin 10 MG CAPS Take 10 mg by mouth at bedtime.     metoprolol succinate (TOPROL-XL) 25 MG 24 hr tablet TAKE 1/2 TABLET IN THE EVENING WITH OR IMMEDIATELY FOLLOWING A MEAL (Patient taking differently: Take 12.5 mg by mouth daily.) 30 tablet 3   montelukast (SINGULAIR) 10 MG tablet TAKE ONE TABLET AT BEDTIME. (Patient taking differently: Take 10 mg by mouth at bedtime.) 28 tablet 5   Multiple Vitamins-Minerals (CENTRUM SILVER PO) Take 1 tablet by mouth daily.     omeprazole (PRILOSEC) 20 MG capsule TAKE (1) CAPSULE TWICE DAILY. (Patient taking differently: Take 20 mg by mouth 2 (two) times daily.) 56 capsule 3   potassium chloride SA (KLOR-CON) 20 MEQ tablet TAKE ONE TABLET ONCE DAILY. (Patient taking differently: 20 mEq daily.) 60 tablet 3   predniSONE (DELTASONE) 10 MG tablet Take 4 tablets (40 mg) by mouth daily for 1 day, 3 tablets (30 mg) daily for  1 day, 2 tablets (20 mg) daily for 1 days,1 tablet (10 mg) daily for 1 day, then stop 10 tablet 0   rosuvastatin (CRESTOR) 40 MG tablet TAKE ONE TABLET ONCE DAILY (Patient taking differently: Take 40 mg by mouth daily.) 60 tablet 3   senna (SENOKOT) 8.6 MG tablet Take 1 tablet by mouth 2 (two) times daily.     SYMBICORT 160-4.5 MCG/ACT inhaler USE 2 PUFFS TWICE DAILY. (Patient taking differently: Inhale 2 puffs into the lungs 2 (two) times daily.) 10.2 g 11   tiotropium (SPIRIVA HANDIHALER) 18 MCG inhalation capsule Place 1 capsule (18 mcg total) into inhaler and inhale daily. 30 capsule 0   No current facility-administered medications on file prior to visit.    Past Medical History:  Diagnosis Date   AAA (abdominal aortic aneurysm) LAST ABDOMINAL US 10-20-17 3.3 CM   MONITORED BY DR [name]   Allergy     Anxiety    Arthritis    Atrial fibrillation (HCC)    Basal cell carcinoma    Bilateral carotid artery stenosis DUPLEX 12-29-2012  BY DR [facility]   BILATERAL ICA STENOSIS 60-79%   BPH (benign prostatic hypertrophy)    Cataract    removed both eyes   CHF (congestive heart failure) (HCC)    Chronic diastolic heart failure (HCC)    Chronic kidney disease    stage 3, pt unaware   Colon cancer ([place])    Complication of anesthesia    had nausea after carotid artery surgery, states the medicine given to help the nausea, made it worse   Constipation    COPD (chronic obstructive pulmonary disease) (HCC)    Depression    Emphysema of lung (HCC)    GERD (gastroesophageal reflux disease)    Glaucoma BOTH EYES   Dr Gershon Crane   History of basal cell carcinoma excision    History of lung cancer APRIL 2012  SQUAMOUS CELL---- S/P RIGHT LOWER LOBECTOMY AT [facility] --  NO CHEMORADIATION---  NO RECURRENCE    ONCOLOGIST- DR Tressie Stalker  LOV IN [facility] 10-27-2012   History of pneumothorax    pt unaware   Hx of adenomatous colonic polyps 2005    X 2; 1 hyperplastic polyp; Dr Olevia Perches   Hyperlipidemia    Hypertension    Impaired fasting glucose 2007   108; A1c5.4%   Lesion of bladder    Lung cancer ([place]) 01/25/2012   Microhematuria    OSA (obstructive sleep apnea) 08/29/2015   CPAP SET ON 10   PAD (peripheral artery disease) ([place]) ABI'S  JAN 2014  0.65 ON RIGHT ;  1.04 ON LEFT   Peripheral vascular disease (HCC) S/P ANGIOPLASTY AND STENTING   FOLLOWED  BY DR [name]   Pleural effusion on right 11/18/2018   Chronic, noted on CXR   Sleep apnea    + cpap    Spinal stenosis Sept. 2015   Status post placement of implantable loop recorder    Stroke ([place]) Jul 07, 2013   mini  TIA   Thoracic aorta atherosclerosis (HCC)    Thyrotoxicosis    amiodarone induced   Urgency of urination     Past Surgical History:  Procedure Laterality Date   ANGIO PLASTY     X 4 in legs   AORTOGRAM  07-27-2002   MILD DIFFUSE  ILIAC ARTERY OCCLUSIVE DISEASE /  LEFT RENAL ARTERY 20%/ PATENT LEFT FEM-POP GRAFT/ MILD SFA AND POPLITEAL ARTERY OCCLUSIVE DISEASE W/ SEVERE KIDNEY OCCLUSIVE DISEASE   ATRIAL FIBRILLATION  ABLATION N/A 04/09/2019   Procedure: ATRIAL FIBRILLATION ABLATION;  Surgeon: Thompson Grayer, MD;  Location: [place] [facility];  Service: Cardiovascular;  Laterality: N/A;   ATRIAL FIBRILLATION ABLATION N/A 02/12/2020   Procedure: ATRIAL FIBRILLATION ABLATION;  Surgeon: Thompson Grayer, MD;  Location: [place] CV [facility];  Service: Cardiovascular;  Laterality: N/A;   BASAL CELL CARCINOMA EXCISION     MULTIPLE TIMES--  RIGHT FOREARM, CHEEKS, AND BACK   BIOPSY  06/25/2018   Procedure: BIOPSY;  Surgeon: Irving Copas., MD;  Location: [place];  Service: Gastroenterology;;   CARDIOVASCULAR STRESS TEST  12-08-2012  DR [facility]   NORMAL LEXISCAN WITH NO EXERCISE NUCLEAR STUDY/ EF 66%/   NO ISCHEMIA/ NO SIGNIFICANT CHANGE FROM PRIOR STUDY   CARDIOVERSION N/A 02/14/2018   Procedure: CARDIOVERSION;  Surgeon: Larey Dresser, MD;  Location: [place];  Service: Cardiovascular;  Laterality: N/A;   CAROTID ANGIOGRAM N/A 07/10/2013   Procedure: CAROTID ANGIOGRAM;  Surgeon: Elam Dutch, MD;  Location: [facility] C[facility] LAB;  Service: Cardiovascular;  Laterality: N/A;   CAROTID ENDARTERECTOMY Right 07-14-13   cea   CATARACT EXTRACTION W/ INTRAOCULAR LENS  IMPLANT, BILATERAL     colon polyectomy     COLONOSCOPY     COLONOSCOPY WITH PROPOFOL N/A 06/25/2018   Procedure: COLONOSCOPY WITH PROPOFOL;  Surgeon: Rush Landmark Telford Nab., MD;  Location: [place];  Service: Gastroenterology;  Laterality: N/A;   CYSTOSCOPY W/ RETROGRADES Bilateral 01/21/2013   Procedure: CYSTOSCOPY WITH RETROGRADE PYELOGRAM;  Surgeon: Molli Hazard, MD;  Location: [facility];  Service: Urology;  Laterality: Bilateral;   CYSTO, BLADDER BIOPSY, BILATERAL RETROGRADE PYELOGRAM  RAD TECH FROM RADIOLOGY PER JOY   CYSTOSCOPY WITH  BIOPSY N/A 01/21/2013   Procedure: CYSTOSCOPY WITH BIOPSY;  Surgeon: Molli Hazard, MD;  Location: [facility];  Service: Urology;  Laterality: N/A;   ENDARTERECTOMY Right 07/14/2013   Procedure: ENDARTERECTOMY CAROTID;  Surgeon: Angelia Mould, MD;  Location: [place];  Service: Vascular;  Laterality: Right;   EP IMPLANTABLE DEVICE N/A 08/12/2015   Procedure: Loop Recorder Insertion;  Surgeon: Thompson Grayer, MD;  Location: [place] CV [facility];  Service: Cardiovascular;  Laterality: N/A;   ESOPHAGOGASTRODUODENOSCOPY (EGD) WITH PROPOFOL N/A 06/25/2018   Procedure: ESOPHAGOGASTRODUODENOSCOPY (EGD) WITH PROPOFOL;  Surgeon: Rush Landmark Telford Nab., MD;  Location: [place];  Service: Gastroenterology;  Laterality: N/A;   ESOPHAGOGASTRODUODENOSCOPY (EGD) WITH PROPOFOL N/A 07/10/2018   Procedure: ESOPHAGOGASTRODUODENOSCOPY (EGD) WITH PROPOFOL;  Surgeon: Milus Banister, MD;  Location: [facility];  Service: Endoscopy;  Laterality: N/A;   EUS N/A 07/10/2018   Procedure: UPPER ENDOSCOPIC ULTRASOUND (EUS) RADIAL;  Surgeon: Milus Banister, MD;  Location: [facility];  Service: Endoscopy;  Laterality: N/A;   EYE SURGERY Right    FEMORAL-POPLITEAL BYPASS GRAFT Left [place]   AND 2001  IN [place]   FEMORAL-POPLITEAL BYPASS GRAFT Left 08/30/2015   Procedure: REVISION OF BYPASS GRAFT Left  FEMORAL-POPLITEAL ARTERY;  Surgeon: Angelia Mould, MD;  Location: [place];  Service: Vascular;  Laterality: Left;   FINE NEEDLE ASPIRATION N/A 07/10/2018   Procedure: FINE NEEDLE ASPIRATION (FNA) LINEAR;  Surgeon: Milus Banister, MD;  Location: [facility];  Service: Endoscopy;  Laterality: N/A;   FLEXIBLE SIGMOIDOSCOPY N/A 12/12/2018   Procedure: FLEXIBLE SIGMOIDOSCOPY;  Surgeon: Ileana Roup, MD;  Location: [facility];  Service: General;  Laterality: N/A;   LARYNGOSCOPY  06-27-2004   BX VOCAL CORD  (LEUKOPLAKIA)  PER PT NO ISSUES SINCE  LEFT HEART CATH AND CORONARY  ANGIOGRAPHY N/A 08/20/2017   Procedure: LEFT HEART CATH AND CORONARY ANGIOGRAPHY;  Surgeon: Larey Dresser, MD;  Location: [place] [facility];  Service: Cardiovascular;  Laterality: N/A;   LOOP RECORDER INSERTION N/A 04/09/2019   Procedure: LOOP RECORDER INSERTION;  Surgeon: Thompson Grayer, MD;  Location: [place] [facility];  Service: Cardiovascular;  Laterality: N/A;   LOOP RECORDER REMOVAL N/A 04/09/2019   Procedure: LOOP RECORDER REMOVAL;  Surgeon: Thompson Grayer, MD;  Location: [place] [facility];  Service: Cardiovascular;  Laterality: N/A;   LOWER EXTREMITY ANGIOGRAM Bilateral 08/29/2015   Procedure: Lower Extremity Angiogram;  Surgeon: Angelia Mould, MD;  Location: [place] [facility];  Service: Cardiovascular;  Laterality: Bilateral;   LUNG LOBECTOMY  01/24/2011    RIGHT UPPER LOBE  (SQUAMOUS CELL CARCINOMA) Dr Dorthea Cove , [facility]. No chemotherapyor radiation   PATCH ANGIOPLASTY Right 07/14/2013   Procedure: PATCH ANGIOPLASTY;  Surgeon: Angelia Mould, MD;  Location: [place];  Service: Vascular;  Laterality: Right;   PATCH ANGIOPLASTY Left 08/30/2015   Procedure: VEIN PATCH ANGIOPLASTY OF PROXIMAL Left BYPASS GRAFT;  Surgeon: Angelia Mould, MD;  Location: [place];  Service: Vascular;  Laterality: Left;   PERIPHERAL VASCULAR CATHETERIZATION N/A 08/29/2015   Procedure: Abdominal Aortogram;  Surgeon: Angelia Mould, MD;  Location: [place] [facility];  Service: Cardiovascular;  Laterality: N/A;   POLYPECTOMY  06/25/2018   Procedure: POLYPECTOMY;  Surgeon: Rush Landmark Telford Nab., MD;  Location: [place];  Service: Gastroenterology;;   POLYPECTOMY     SUBMUCOSAL INJECTION  06/25/2018   Procedure: SUBMUCOSAL INJECTION;  Surgeon: Irving Copas., MD;  Location: [name];  Service: Gastroenterology;;   TEE WITHOUT CARDIOVERSION N/A 02/14/2018   Procedure: TRANSESOPHAGEAL ECHOCARDIOGRAM (TEE);  Surgeon: Larey Dresser, MD;  Location: [facility] ENDOSCOPY;  Service:  Cardiovascular;  Laterality: N/A;   trabecular surgery     OS   TRANSTHORACIC ECHOCARDIOGRAM  12-29-2012  DR [facility]   MILD LVH/  LVSF NORMAL/ EF 53-61%/  GRADE I DIASTOLIC DYSFUNCTION    Social History   Socioeconomic History   Marital status: Married    Spouse name: [name]    Number of children: 3   Years of education: 12+   Highest education level: Not on file  Occupational History   Occupation: Retired    Comment: Owns a [employer], Advertising account executive, 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  Tobacco Use   Smoking status: Former    Packs/day: 0.50    Years: 50.00    Pack years: 25.00    Types: Cigarettes    Quit date: 07/28/2014    Years since quitting: 6.9   Smokeless tobacco: Never  Vaping Use   Vaping Use: Never used  Substance and Sexual Activity   Alcohol use: Yes    Alcohol/week: 4.0 standard drinks    Types: 1 Glasses of wine, 1 Cans of beer, 1 Shots of liquor, 1 Standard drinks or equivalent per week    Comment:  socially, variable   Drug use: No   Sexual activity: Not on file  Other Topics Concern   Not on file  Social History Narrative   Patient lives at home with spouse [name]   Patient has 3 children    Patient is right handed    Social Determinants of Health   Financial Resource Strain: Low Risk    Difficulty of Paying Living Expenses: Not hard at  all  Food Insecurity: No Food Insecurity   Worried About Charity fundraiser in the Last Year: Never true   Ran Out of Food in the Last Year: Never true  Transportation Needs: No Transportation Needs   Lack of Transportation (Medical): No   Lack of Transportation (Non-Medical): No  Physical Activity: Insufficiently Active   Days of Exercise per Week: 3 days   Minutes of Exercise per Session: 30 min  Stress: No Stress Concern Present   Feeling of Stress : Not at all  Social Connections: Not on file    Family History  Problem Relation Age of Onset   Stroke  Mother        mini strokes   Alcohol abuse Father    Heart disease Father        MI after 60   Stroke Father    Hypertension Father    Heart attack Father    Heart disease Paternal Aunt        several   Hypertension Paternal Aunt        several   Stroke Paternal Aunt        several   Stroke Paternal Uncle        several   Heart disease Paternal Uncle        several;2 had MI pre 52   Cancer Daughter 34       breast ca, also with benign sessile polyp    Colon cancer Daughter 41   Colon polyps Neg Hx    Esophageal cancer Neg Hx    Rectal cancer Neg Hx    Stomach cancer Neg Hx     Review of Systems  Constitutional:  Negative for appetite change, chills and fever.  Respiratory:  Positive for shortness of breath (somedyas depending on acitivity). Negative for cough, chest tightness and wheezing.   Cardiovascular:  Negative for chest pain, palpitations and leg swelling.  Gastrointestinal:  Negative for abdominal pain, constipation, diarrhea and nausea.  Neurological:  Positive for light-headedness (occ if gets up too quick). Negative for headaches.  Psychiatric/Behavioral:  Negative for dysphoric mood and sleep disturbance. The patient is not nervous/anxious.       Objective:   Vitals:   07/11/21 1419  BP: 138/60  Pulse: 60  Temp: 98 F (36.7 C)  SpO2: 95%   BP Readings from Last 3 Encounters:  07/11/21 138/60  07/05/21 (!) 177/71  06/30/21 (!) 174/82   Wt Readings from Last 3 Encounters:  07/11/21 194 lb (88 kg)  07/01/21 196 lb (88.9 kg)  06/30/21 196 lb (88.9 kg)   Body mass index is 25.6 kg/m.   Physical Exam    Constitutional: Appears well-developed and well-nourished. No distress.  HENT:  Head: Normocephalic and atraumatic.  Neck: Neck supple. No tracheal deviation present. No thyromegaly present.  No cervical lymphadenopathy Cardiovascular: Normal rate, regular rhythm and normal heart sounds.   No murmur heard. No carotid bruit .  No  edema Pulmonary/Chest: Effort normal and breath sounds normal. No respiratory distress. No has no wheezes. No rales. Abdomen: Soft, nontender, nondistended Skin: Skin is warm and dry. Not diaphoretic.  Psychiatric: Normal mood and affect. Behavior is normal.      Assessment & Plan:    See Problem List for Assessment and Plan of chronic medical problems.

## 2021-07-11 ENCOUNTER — Encounter: Payer: Self-pay | Admitting: Internal Medicine

## 2021-07-11 ENCOUNTER — Ambulatory Visit (INDEPENDENT_AMBULATORY_CARE_PROVIDER_SITE_OTHER): Payer: PPO | Admitting: Internal Medicine

## 2021-07-11 ENCOUNTER — Other Ambulatory Visit: Payer: Self-pay

## 2021-07-11 VITALS — BP 138/60 | HR 60 | Temp 98.0°F | Ht 73.0 in | Wt 194.0 lb

## 2021-07-11 DIAGNOSIS — F3289 Other specified depressive episodes: Secondary | ICD-10-CM | POA: Diagnosis not present

## 2021-07-11 DIAGNOSIS — I480 Paroxysmal atrial fibrillation: Secondary | ICD-10-CM | POA: Diagnosis not present

## 2021-07-11 DIAGNOSIS — J9601 Acute respiratory failure with hypoxia: Secondary | ICD-10-CM | POA: Diagnosis not present

## 2021-07-11 DIAGNOSIS — R7303 Prediabetes: Secondary | ICD-10-CM | POA: Diagnosis not present

## 2021-07-11 DIAGNOSIS — E785 Hyperlipidemia, unspecified: Secondary | ICD-10-CM

## 2021-07-11 DIAGNOSIS — I10 Essential (primary) hypertension: Secondary | ICD-10-CM

## 2021-07-11 DIAGNOSIS — I5032 Chronic diastolic (congestive) heart failure: Secondary | ICD-10-CM

## 2021-07-11 DIAGNOSIS — U071 COVID-19: Secondary | ICD-10-CM | POA: Diagnosis not present

## 2021-07-11 LAB — COMPREHENSIVE METABOLIC PANEL
ALT: 47 U/L (ref 0–53)
AST: 26 U/L (ref 0–37)
Albumin: 3.6 g/dL (ref 3.5–5.2)
Alkaline Phosphatase: 88 U/L (ref 39–117)
BUN: 29 mg/dL — ABNORMAL HIGH (ref 6–23)
CO2: 28 mEq/L (ref 19–32)
Calcium: 8.9 mg/dL (ref 8.4–10.5)
Chloride: 98 mEq/L (ref 96–112)
Creatinine, Ser: 1.57 mg/dL — ABNORMAL HIGH (ref 0.40–1.50)
GFR: 41.29 mL/min — ABNORMAL LOW (ref >60.00)
Glucose, Bld: 141 mg/dL — ABNORMAL HIGH (ref 70–99)
Potassium: 4.3 mEq/L (ref 3.5–5.1)
Sodium: 136 mEq/L (ref 135–145)
Total Bilirubin: 0.6 mg/dL (ref 0.2–1.2)
Total Protein: 6.6 g/dL (ref 6.0–8.3)

## 2021-07-11 LAB — LIPID PANEL
Cholesterol: 102 mg/dL (ref 0–200)
HDL: 32.8 mg/dL — ABNORMAL LOW (ref >39.00)
NonHDL: 69.2
Total CHOL/HDL Ratio: 3
Triglycerides: 351 mg/dL — ABNORMAL HIGH (ref 0.0–149.0)
VLDL: 70.2 mg/dL — ABNORMAL HIGH (ref 0.0–40.0)

## 2021-07-11 LAB — HEMOGLOBIN A1C: Hgb A1c MFr Bld: 7 % — ABNORMAL HIGH (ref 4.6–6.5)

## 2021-07-11 LAB — LDL CHOLESTEROL, DIRECT: Direct LDL: 41 mg/dL

## 2021-07-11 NOTE — Assessment & Plan Note (Signed)
Chronic Blood pressure well controlled CMP Continue amlodipine 5 mg daily losartan 25 mg daily, metoprolol 12.5 mg XL daily

## 2021-07-11 NOTE — Assessment & Plan Note (Signed)
Chronic Resolved Oxygen saturation here today on room air is 95% Has shortness of breath some days depending on activity, but not on a regular basis Denies coughing and wheezing Continue Spiriva daily, Symbicort inhaler twice daily, albuterol inhaler as needed

## 2021-07-11 NOTE — Assessment & Plan Note (Signed)
Chronic Heart rate well controlled Denies palpitations Continue Eliquis 5 mg daily, metoprolol XL 12.5 mg daily CMP, CBC

## 2021-07-11 NOTE — Assessment & Plan Note (Signed)
Chronic Euvolemic Continue current medications, including Lasix 60 mg twice daily CMP

## 2021-07-11 NOTE — Assessment & Plan Note (Signed)
Chronic Regular exercise and healthy diet encouraged Check lipid panel  Continue rosuvastatin 40 mg daily

## 2021-07-11 NOTE — Patient Instructions (Addendum)
    Flu immunization administered today.     Blood work was ordered.     Medications changes include :   none     Please followup in 6 months

## 2021-07-11 NOTE — Assessment & Plan Note (Signed)
Chronic Controlled, Stable Continue Lexapro 10 mg daily

## 2021-07-12 LAB — CBC WITH DIFFERENTIAL/PLATELET
Basophils Absolute: 0 10*3/uL (ref 0.0–0.1)
Basophils Relative: 0.1 % (ref 0.0–3.0)
Eosinophils Absolute: 0.1 10*3/uL (ref 0.0–0.7)
Eosinophils Relative: 0.4 % (ref 0.0–5.0)
HCT: 40.2 % (ref 39.0–52.0)
Hemoglobin: 13.4 g/dL (ref 13.0–17.0)
Lymphocytes Relative: 5.6 % — ABNORMAL LOW (ref 12.0–46.0)
Lymphs Abs: 0.8 10*3/uL (ref 0.7–4.0)
MCHC: 33.4 g/dL (ref 30.0–36.0)
MCV: 91.6 fl (ref 78.0–100.0)
Monocytes Absolute: 0.6 10*3/uL (ref 0.1–1.0)
Monocytes Relative: 4.1 % (ref 3.0–12.0)
Neutro Abs: 12.2 10*3/uL — ABNORMAL HIGH (ref 1.4–7.7)
Neutrophils Relative %: 89.8 % — ABNORMAL HIGH (ref 43.0–77.0)
Platelets: 436 10*3/uL — ABNORMAL HIGH (ref 150.0–400.0)
RBC: 4.39 Mil/uL (ref 4.22–5.81)
RDW: 15 % (ref 11.5–15.5)
WBC: 13.6 10*3/uL — ABNORMAL HIGH (ref 4.0–10.5)

## 2021-07-13 ENCOUNTER — Telehealth: Payer: Self-pay | Admitting: Internal Medicine

## 2021-07-13 NOTE — Telephone Encounter (Signed)
There are 2 major types of heart failure.  His heart is functioning at a normal level, but his heart is on the stiffer side and does not feel as well as it should.  This can make him get fluid overloaded easier and because a different type of heart failure.  That is one of the reasons he is on the water pill to help control his fluid

## 2021-07-13 NOTE — Telephone Encounter (Signed)
Stanton Kidney, an RN from [facility] has called and states that the pt. Is unaware of his heart failure condition. She requests that provider's assistant contacts pt. To explain diagnosis ASAP.     Callback # (pt)- [phone] Callback # Stanton Kidney)- [phone]

## 2021-07-13 NOTE — Telephone Encounter (Signed)
Results given to patient today. 

## 2021-07-14 ENCOUNTER — Telehealth: Payer: Self-pay | Admitting: Internal Medicine

## 2021-07-14 DIAGNOSIS — I480 Paroxysmal atrial fibrillation: Secondary | ICD-10-CM | POA: Diagnosis not present

## 2021-07-14 DIAGNOSIS — J439 Emphysema, unspecified: Secondary | ICD-10-CM | POA: Diagnosis not present

## 2021-07-14 DIAGNOSIS — G4733 Obstructive sleep apnea (adult) (pediatric): Secondary | ICD-10-CM | POA: Diagnosis not present

## 2021-07-14 DIAGNOSIS — Z7951 Long term (current) use of inhaled steroids: Secondary | ICD-10-CM | POA: Diagnosis not present

## 2021-07-14 DIAGNOSIS — E785 Hyperlipidemia, unspecified: Secondary | ICD-10-CM | POA: Diagnosis not present

## 2021-07-14 DIAGNOSIS — Z7901 Long term (current) use of anticoagulants: Secondary | ICD-10-CM | POA: Diagnosis not present

## 2021-07-14 DIAGNOSIS — F32A Depression, unspecified: Secondary | ICD-10-CM | POA: Diagnosis not present

## 2021-07-14 DIAGNOSIS — M1990 Unspecified osteoarthritis, unspecified site: Secondary | ICD-10-CM | POA: Diagnosis not present

## 2021-07-14 DIAGNOSIS — J9601 Acute respiratory failure with hypoxia: Secondary | ICD-10-CM | POA: Diagnosis not present

## 2021-07-14 DIAGNOSIS — M4800 Spinal stenosis, site unspecified: Secondary | ICD-10-CM | POA: Diagnosis not present

## 2021-07-14 DIAGNOSIS — U071 COVID-19: Secondary | ICD-10-CM | POA: Diagnosis not present

## 2021-07-14 DIAGNOSIS — I739 Peripheral vascular disease, unspecified: Secondary | ICD-10-CM | POA: Diagnosis not present

## 2021-07-14 DIAGNOSIS — N1831 Chronic kidney disease, stage 3a: Secondary | ICD-10-CM | POA: Diagnosis not present

## 2021-07-14 DIAGNOSIS — I5032 Chronic diastolic (congestive) heart failure: Secondary | ICD-10-CM | POA: Diagnosis not present

## 2021-07-14 DIAGNOSIS — I130 Hypertensive heart and chronic kidney disease with heart failure and stage 1 through stage 4 chronic kidney disease, or unspecified chronic kidney disease: Secondary | ICD-10-CM | POA: Diagnosis not present

## 2021-07-14 DIAGNOSIS — Z9181 History of falling: Secondary | ICD-10-CM | POA: Diagnosis not present

## 2021-07-14 DIAGNOSIS — Z7952 Long term (current) use of systemic steroids: Secondary | ICD-10-CM | POA: Diagnosis not present

## 2021-07-14 DIAGNOSIS — N400 Enlarged prostate without lower urinary tract symptoms: Secondary | ICD-10-CM | POA: Diagnosis not present

## 2021-07-14 DIAGNOSIS — I6523 Occlusion and stenosis of bilateral carotid arteries: Secondary | ICD-10-CM | POA: Diagnosis not present

## 2021-07-14 DIAGNOSIS — Z87891 Personal history of nicotine dependence: Secondary | ICD-10-CM | POA: Diagnosis not present

## 2021-07-14 NOTE — Telephone Encounter (Signed)
Cecilie Lowers from [facility] called   Stated patient is doing well and he was scheduled for PT on Monday. Based on his last visit he does not see the need for PT.

## 2021-07-17 DIAGNOSIS — H401133 Primary open-angle glaucoma, bilateral, severe stage: Secondary | ICD-10-CM | POA: Diagnosis not present

## 2021-07-20 ENCOUNTER — Telehealth: Payer: Self-pay | Admitting: Internal Medicine

## 2021-07-20 NOTE — Telephone Encounter (Signed)
Troy [name] from [place] called  Switching from petrolatum to the Xeroform gauze instead for the dressings for his wounds.   Best contact #: [phone]

## 2021-07-27 ENCOUNTER — Encounter: Payer: Self-pay | Admitting: Pulmonary Disease

## 2021-07-27 ENCOUNTER — Ambulatory Visit (INDEPENDENT_AMBULATORY_CARE_PROVIDER_SITE_OTHER): Payer: PPO | Admitting: Pulmonary Disease

## 2021-07-27 ENCOUNTER — Other Ambulatory Visit: Payer: Self-pay

## 2021-07-27 VITALS — BP 114/68 | HR 74 | Temp 98.0°F | Ht 73.0 in | Wt 194.0 lb

## 2021-07-27 DIAGNOSIS — R599 Enlarged lymph nodes, unspecified: Secondary | ICD-10-CM

## 2021-07-27 DIAGNOSIS — J90 Pleural effusion, not elsewhere classified: Secondary | ICD-10-CM

## 2021-07-27 DIAGNOSIS — Z85038 Personal history of other malignant neoplasm of large intestine: Secondary | ICD-10-CM

## 2021-07-27 DIAGNOSIS — R911 Solitary pulmonary nodule: Secondary | ICD-10-CM

## 2021-07-27 MED ORDER — BREZTRI AEROSPHERE 160-9-4.8 MCG/ACT IN AERO: 2.0000 | INHALATION_SPRAY | Freq: Two times a day (BID) | RESPIRATORY_TRACT | 0 refills | Status: AC

## 2021-07-27 MED ORDER — BREZTRI AEROSPHERE 160-9-4.8 MCG/ACT IN AERO: 2.0000 | INHALATION_SPRAY | Freq: Two times a day (BID) | RESPIRATORY_TRACT | 3 refills | Status: DC

## 2021-07-27 NOTE — Progress Notes (Signed)
Synopsis: Referred in June 2022 for abnormal CT chest, PCP: By Binnie Rail, MD  Subjective:   PATIENT ID: Troy [name] [name] GENDER: male DOB: Dec 10, 1940, MRN: [id number]  Chief Complaint  Patient presents with   Follow-up    Is an 80 year old gentleman, past medical history of atrial fibrillation, AAA, BPH, chronic diastolic heart failure, emphysema, history of lung cancer, April 2012, squamous cell carcinoma status post right lower lobectomy at [facility], no chemo no radiation no recurrence, hyperlipidemia, hypertension.  Patient has no respiratory complaints today.  He is anxious about this slow-growing lesion that has been followed for some time.  He has several within the right chest, none within the left chest.  Nuclear medicine pet imaging on 02/08/2021 completed revealing a bandlike hypermetabolic nodules within the inferior aspect of the right upper lobe that have slowly increased over time this nodularity area concerning for malignancy as well as a hypermetabolic nodule just inferior to the SVC within the right upper mediastinum concerning for malignancy as well as a small nodule 13 mm in size with low-level metabolic uptake SUV of 1.4 less concerning for malignancy.  However due to his history of lung cancer status post lobectomy and colon cancer they have been following these areas closely and medical oncology.  OV 05/25/2021: Here today for follow-up regarding lung nodules.  Has no respiratory complaints today.  Had CT scan in June.  Here today for follow-up.  Lung nodule appears stable in comparison to his previous imaging.  OV 07/27/2021: Here today for follow-up after recent hospitalization.  Patient brought into the hospital found to have pleural effusion.  Had thoracentesis completed at that time which was negative for malignant cells did have lymphocyte predominance.  From respiratory standpoint he is doing okay.  Had repeat CT imaging at the time also showed dissipation of the  nodules that we were following in the upper lobes.  He was also discharged from the hospital on multiple inhalers and would like clarification which one he wants to be on.   Past Medical History:  Diagnosis Date   AAA (abdominal aortic aneurysm) LAST ABDOMINAL US 10-20-17 3.3 CM   MONITORED BY DR [name]   Allergy    Anxiety    Arthritis    Atrial fibrillation (HCC)    Basal cell carcinoma    Bilateral carotid artery stenosis DUPLEX 12-29-2012  BY DR [facility]   BILATERAL ICA STENOSIS 60-79%   BPH (benign prostatic hypertrophy)    Cataract    removed both eyes   CHF (congestive heart failure) (HCC)    Chronic diastolic heart failure (HCC)    Chronic kidney disease    stage 3, pt unaware   Colon cancer ([place])    Complication of anesthesia    had nausea after carotid artery surgery, states the medicine given to help the nausea, made it worse   Constipation    COPD (chronic obstructive pulmonary disease) (HCC)    Depression    Emphysema of lung (HCC)    GERD (gastroesophageal reflux disease)    Glaucoma BOTH EYES   Dr Gershon Crane   History of basal cell carcinoma excision    History of lung cancer APRIL 2012  SQUAMOUS CELL---- S/P RIGHT LOWER LOBECTOMY AT [facility] --  NO CHEMORADIATION---  NO RECURRENCE    ONCOLOGIST- DR Tressie Stalker  LOV IN [facility] 10-27-2012   History of pneumothorax    pt unaware   Hx of adenomatous colonic polyps 2005    X 2; 1  hyperplastic polyp; Dr Olevia Perches   Hyperlipidemia    Hypertension    Impaired fasting glucose 2007   108; A1c5.4%   Lesion of bladder    Lung cancer ([place]) 01/25/2012   Microhematuria    OSA (obstructive sleep apnea) 08/29/2015   CPAP SET ON 10   PAD (peripheral artery disease) ([place]) ABI'S  JAN 2014  0.65 ON RIGHT ;  1.04 ON LEFT   Peripheral vascular disease (HCC) S/P ANGIOPLASTY AND STENTING   FOLLOWED  BY DR [name]   Pleural effusion on right 11/18/2018   Chronic, noted on CXR   Sleep apnea    + cpap    Spinal stenosis Sept. 2015   Status  post placement of implantable loop recorder    Stroke ([place]) Jul 07, 2013   mini  TIA   Thoracic aorta atherosclerosis (HCC)    Thyrotoxicosis    amiodarone induced   Urgency of urination      Family History  Problem Relation Age of Onset   Stroke Mother        mini strokes   Alcohol abuse Father    Heart disease Father        MI after 52   Stroke Father    Hypertension Father    Heart attack Father    Heart disease Paternal Aunt        several   Hypertension Paternal Aunt        several   Stroke Paternal Aunt        several   Stroke Paternal Uncle        several   Heart disease Paternal Uncle        several;2 had MI pre 17   Cancer Daughter 57       breast ca, also with benign sessile polyp    Colon cancer Daughter 28   Colon polyps Neg Hx    Esophageal cancer Neg Hx    Rectal cancer Neg Hx    Stomach cancer Neg Hx      Past Surgical History:  Procedure Laterality Date   ANGIO PLASTY     X 4 in legs   AORTOGRAM  07-27-2002   MILD DIFFUSE ILIAC ARTERY OCCLUSIVE DISEASE /  LEFT RENAL ARTERY 20%/ PATENT LEFT FEM-POP GRAFT/ MILD SFA AND POPLITEAL ARTERY OCCLUSIVE DISEASE W/ SEVERE KIDNEY OCCLUSIVE DISEASE   ATRIAL FIBRILLATION ABLATION N/A 04/09/2019   Procedure: ATRIAL FIBRILLATION ABLATION;  Surgeon: Thompson Grayer, MD;  Location: [place] [facility];  Service: Cardiovascular;  Laterality: N/A;   ATRIAL FIBRILLATION ABLATION N/A 02/12/2020   Procedure: ATRIAL FIBRILLATION ABLATION;  Surgeon: Thompson Grayer, MD;  Location: [place] [facility];  Service: Cardiovascular;  Laterality: N/A;   BASAL CELL CARCINOMA EXCISION     MULTIPLE TIMES--  RIGHT FOREARM, CHEEKS, AND BACK   BIOPSY  06/25/2018   Procedure: BIOPSY;  Surgeon: Irving Copas., MD;  Location: [place];  Service: Gastroenterology;;   CARDIOVASCULAR STRESS TEST  12-08-2012  DR [facility]   NORMAL LEXISCAN WITH NO EXERCISE NUCLEAR STUDY/ EF 66%/   NO ISCHEMIA/ NO SIGNIFICANT CHANGE FROM PRIOR STUDY    CARDIOVERSION N/A 02/14/2018   Procedure: CARDIOVERSION;  Surgeon: Larey Dresser, MD;  Location: [place];  Service: Cardiovascular;  Laterality: N/A;   CAROTID ANGIOGRAM N/A 07/10/2013   Procedure: CAROTID ANGIOGRAM;  Surgeon: Elam Dutch, MD;  Location: [facility] CATH LAB;  Service: Cardiovascular;  Laterality: N/A;   CAROTID ENDARTERECTOMY Right 07-14-13   cea  CATARACT EXTRACTION W/ INTRAOCULAR LENS  IMPLANT, BILATERAL     colon polyectomy     COLONOSCOPY     COLONOSCOPY WITH PROPOFOL N/A 06/25/2018   Procedure: COLONOSCOPY WITH PROPOFOL;  Surgeon: Rush Landmark Telford Nab., MD;  Location: [place];  Service: Gastroenterology;  Laterality: N/A;   CYSTOSCOPY W/ RETROGRADES Bilateral 01/21/2013   Procedure: CYSTOSCOPY WITH RETROGRADE PYELOGRAM;  Surgeon: Molli Hazard, MD;  Location: [facility];  Service: Urology;  Laterality: Bilateral;   CYSTO, BLADDER BIOPSY, BILATERAL RETROGRADE PYELOGRAM  RAD TECH FROM RADIOLOGY PER JOY   CYSTOSCOPY WITH BIOPSY N/A 01/21/2013   Procedure: CYSTOSCOPY WITH BIOPSY;  Surgeon: Molli Hazard, MD;  Location: [facility];  Service: Urology;  Laterality: N/A;   ENDARTERECTOMY Right 07/14/2013   Procedure: ENDARTERECTOMY CAROTID;  Surgeon: Angelia Mould, MD;  Location: [place];  Service: Vascular;  Laterality: Right;   EP IMPLANTABLE DEVICE N/A 08/12/2015   Procedure: Loop Recorder Insertion;  Surgeon: Thompson Grayer, MD;  Location: [place] [facility];  Service: Cardiovascular;  Laterality: N/A;   ESOPHAGOGASTRODUODENOSCOPY (EGD) WITH PROPOFOL N/A 06/25/2018   Procedure: ESOPHAGOGASTRODUODENOSCOPY (EGD) WITH PROPOFOL;  Surgeon: Rush Landmark Telford Nab., MD;  Location: [place];  Service: Gastroenterology;  Laterality: N/A;   ESOPHAGOGASTRODUODENOSCOPY (EGD) WITH PROPOFOL N/A 07/10/2018   Procedure: ESOPHAGOGASTRODUODENOSCOPY (EGD) WITH PROPOFOL;  Surgeon: Milus Banister, MD;  Location: [facility];   Service: Endoscopy;  Laterality: N/A;   EUS N/A 07/10/2018   Procedure: UPPER ENDOSCOPIC ULTRASOUND (EUS) RADIAL;  Surgeon: Milus Banister, MD;  Location: [facility];  Service: Endoscopy;  Laterality: N/A;   EYE SURGERY Right    FEMORAL-POPLITEAL BYPASS GRAFT Left [place]   AND 2001  IN [place]   FEMORAL-POPLITEAL BYPASS GRAFT Left 08/30/2015   Procedure: REVISION OF BYPASS GRAFT Left  FEMORAL-POPLITEAL ARTERY;  Surgeon: Angelia Mould, MD;  Location: [place];  Service: Vascular;  Laterality: Left;   FINE NEEDLE ASPIRATION N/A 07/10/2018   Procedure: FINE NEEDLE ASPIRATION (FNA) LINEAR;  Surgeon: Milus Banister, MD;  Location: [facility];  Service: Endoscopy;  Laterality: N/A;   FLEXIBLE SIGMOIDOSCOPY N/A 12/12/2018   Procedure: FLEXIBLE SIGMOIDOSCOPY;  Surgeon: Ileana Roup, MD;  Location: [facility];  Service: General;  Laterality: N/A;   LARYNGOSCOPY  06-27-2004   BX VOCAL CORD  (LEUKOPLAKIA)  PER PT NO ISSUES SINCE   LEFT HEART CATH AND CORONARY ANGIOGRAPHY N/A 08/20/2017   Procedure: LEFT HEART CATH AND CORONARY ANGIOGRAPHY;  Surgeon: Larey Dresser, MD;  Location: [name] [facility];  Service: Cardiovascular;  Laterality: N/A;   LOOP RECORDER INSERTION N/A 04/09/2019   Procedure: LOOP RECORDER INSERTION;  Surgeon: Thompson Grayer, MD;  Location: [place] [facility];  Service: Cardiovascular;  Laterality: N/A;   LOOP RECORDER REMOVAL N/A 04/09/2019   Procedure: LOOP RECORDER REMOVAL;  Surgeon: Thompson Grayer, MD;  Location: [place] [facility];  Service: Cardiovascular;  Laterality: N/A;   LOWER EXTREMITY ANGIOGRAM Bilateral 08/29/2015   Procedure: Lower Extremity Angiogram;  Surgeon: Angelia Mould, MD;  Location: [place] [facility];  Service: Cardiovascular;  Laterality: Bilateral;   LUNG LOBECTOMY  01/24/2011    RIGHT UPPER LOBE  (SQUAMOUS CELL CARCINOMA) Dr Dorthea [place] , [facility]. No chemotherapyor radiation   PATCH ANGIOPLASTY Right 07/14/2013   Procedure: PATCH  ANGIOPLASTY;  Surgeon: Angelia Mould, MD;  Location: [place];  Service: Vascular;  Laterality: Right;   PATCH ANGIOPLASTY Left 08/30/2015   Procedure: [place]  OF PROXIMAL Left BYPASS GRAFT;  Surgeon: Angelia Mould, MD;  Location: [place];  Service: Vascular;  Laterality: Left;   PERIPHERAL VASCULAR CATHETERIZATION N/A 08/29/2015   Procedure: Abdominal Aortogram;  Surgeon: Angelia Mould, MD;  Location: [place] [facility];  Service: Cardiovascular;  Laterality: N/A;   POLYPECTOMY  06/25/2018   Procedure: POLYPECTOMY;  Surgeon: Rush Landmark Telford Nab., MD;  Location: [place];  Service: Gastroenterology;;   POLYPECTOMY     SUBMUCOSAL INJECTION  06/25/2018   Procedure: SUBMUCOSAL INJECTION;  Surgeon: Irving Copas., MD;  Location: [place];  Service: Gastroenterology;;   TEE WITHOUT CARDIOVERSION N/A 02/14/2018   Procedure: TRANSESOPHAGEAL ECHOCARDIOGRAM (TEE);  Surgeon: Larey Dresser, MD;  Location: [facility] ENDOSCOPY;  Service: Cardiovascular;  Laterality: N/A;   trabecular surgery     OS   TRANSTHORACIC ECHOCARDIOGRAM  12-29-2012  DR [facility]   MILD LVH/  LVSF NORMAL/ EF 95-63%/  GRADE I DIASTOLIC DYSFUNCTION    Social History   Socioeconomic History   Marital status: Married    Spouse name: [name]    Number of children: 3   Years of education: 12+   Highest education level: Not on file  Occupational History   Occupation: Retired    Comment: Owns a [employer], Advertising account executive, 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  Tobacco Use   Smoking status: Former    Packs/day: 0.50    Years: 50.00    Pack years: 25.00    Types: Cigarettes    Quit date: 07/28/2014    Years since quitting: 7.0   Smokeless tobacco: Never  Vaping Use   Vaping Use: Never used  Substance and Sexual Activity   Alcohol use: Yes    Alcohol/week: 4.0 standard drinks    Types: 1 Glasses of wine, 1 Cans of beer, 1 Shots of  liquor, 1 Standard drinks or equivalent per week    Comment:  socially, variable   Drug use: No   Sexual activity: Not on file  Other Topics Concern   Not on file  Social History Narrative   Patient lives at home with spouse [name]   Patient has 3 children    Patient is right handed    Social Determinants of Health   Financial Resource Strain: Low Risk    Difficulty of Paying Living Expenses: Not hard at all  Food Insecurity: No Food Insecurity   Worried About Charity fundraiser in the Last Year: Never true   Arboriculturist in the Last Year: Never true  Transportation Needs: No Transportation Needs   Lack of Transportation (Medical): No   Lack of Transportation (Non-Medical): No  Physical Activity: Insufficiently Active   Days of Exercise per Week: 3 days   Minutes of Exercise per Session: 30 min  Stress: No Stress Concern Present   Feeling of Stress : Not at all  Social Connections: Not on file  Intimate Partner Violence: Not on file     Allergies  Allergen Reactions   Niacin-Lovastatin Er Shortness Of Breath and Other (See Comments)    Dyspnea, flushing   Penicillins Hives, Shortness Of Breath and Other (See Comments)    Flushing  & dyspnea Because of a history of documented adverse serious drug reaction;Medi Alert bracelet  is recommended PCN reaction causing immediate rash, facial/tongue/throat swelling, SOB or lightheadedness with hypotension: Yes PCN reaction causing severe rash involving mucus membranes or skin necrosis: No PCN reaction occurring  within the last 10 years: NO PCN reaction that required hospitalization: NO   Sulfonamide Derivatives Hives, Shortness Of Breath and Other (See Comments)    Flushing & dyspnea Because of a history of documented adverse serious drug reaction;Medi Alert bracelet  is recommended   Atorvastatin Other (See Comments)    Myalgias & athralgias- takes Crestor, DOES NOT LIKE LIPITOR     Outpatient Medications Prior to Visit   Medication Sig Dispense Refill   acetaminophen (TYLENOL) 325 MG tablet Take 650 mg by mouth every 6 (six) hours as needed for moderate pain or headache.     albuterol (VENTOLIN HFA) 108 (90 Base) MCG/ACT inhaler Inhale 2 puffs into the lungs every 6 (six) hours as needed for wheezing or shortness of breath. 8.5 g 0   amLODipine (NORVASC) 5 MG tablet TAKE ONE TABLET ONCE DAILY, (Patient taking differently: Take 5 mg by mouth daily.) 60 tablet 2   benzonatate (TESSALON) 200 MG capsule Take 1 capsule (200 mg total) by mouth 3 (three) times daily as needed for cough. 20 capsule 0   Brinzolamide-Brimonidine (SIMBRINZA) 1-0.2 % SUSP Place 1 drop into both eyes 2 times daily.     docusate sodium (COLACE) 100 MG capsule Take 100 mg by mouth 2 (two) times daily.      ELIQUIS 5 MG TABS tablet TAKE ONE TABLET BY MOUTH TWICE DAILY. (Patient taking differently: Take 5 mg by mouth 2 (two) times daily.) 120 tablet 3   empagliflozin (JARDIANCE) 10 MG TABS tablet Take 1 tablet (10 mg total) by mouth daily before breakfast. 30 tablet 11   escitalopram (LEXAPRO) 10 MG tablet TAKE ONE TABLET ONCE DAILY (Patient taking differently: Take 10 mg by mouth daily.) 28 tablet 5   furosemide (LASIX) 20 MG tablet Take 3 tablets (60 mg total) by mouth 2 (two) times daily. 180 tablet 11   latanoprost (XALATAN) 0.005 % ophthalmic solution Place 1 drop into both eyes at bedtime.     losartan (COZAAR) 25 MG tablet Take 1 tablet (25 mg total) by mouth daily. 90 tablet 3   Melatonin 10 MG CAPS Take 10 mg by mouth at bedtime.     metoprolol succinate (TOPROL-XL) 25 MG 24 hr tablet TAKE 1/2 TABLET IN THE EVENING WITH OR IMMEDIATELY FOLLOWING A MEAL (Patient taking differently: Take 12.5 mg by mouth daily.) 30 tablet 3   montelukast (SINGULAIR) 10 MG tablet TAKE ONE TABLET AT BEDTIME. (Patient taking differently: Take 10 mg by mouth at bedtime.) 28 tablet 5   Multiple Vitamins-Minerals (CENTRUM SILVER PO) Take 1 tablet by mouth daily.      omeprazole (PRILOSEC) 20 MG capsule TAKE (1) CAPSULE TWICE DAILY. (Patient taking differently: Take 20 mg by mouth 2 (two) times daily.) 56 capsule 3   potassium chloride SA (KLOR-CON) 20 MEQ tablet TAKE ONE TABLET ONCE DAILY. (Patient taking differently: 20 mEq daily.) 60 tablet 3   predniSONE (DELTASONE) 10 MG tablet Take 4 tablets (40 mg) by mouth daily for 1 day, 3 tablets (30 mg) daily for 1 day, 2 tablets (20 mg) daily for 1 days,1 tablet (10 mg) daily for 1 day, then stop 10 tablet 0   rosuvastatin (CRESTOR) 40 MG tablet TAKE ONE TABLET ONCE DAILY (Patient taking differently: Take 40 mg by mouth daily.) 60 tablet 3   senna (SENOKOT) 8.6 MG tablet Take 1 tablet by mouth 2 (two) times daily.     SYMBICORT 160-4.5 MCG/ACT inhaler USE 2 PUFFS TWICE DAILY. (Patient taking differently: Inhale 2  puffs into the lungs 2 (two) times daily.) 10.2 g 11   tiotropium (SPIRIVA HANDIHALER) 18 MCG inhalation capsule Place 1 capsule (18 mcg total) into inhaler and inhale daily. 30 capsule 0   No facility-administered medications prior to visit.    Review of Systems  Constitutional:  Negative for chills, fever, malaise/fatigue and weight loss.  HENT:  Negative for hearing loss, sore throat and tinnitus.   Eyes:  Negative for blurred vision and double vision.  Respiratory:  Positive for shortness of breath. Negative for cough, hemoptysis, sputum production, wheezing and stridor.   Cardiovascular:  Negative for chest pain, palpitations, orthopnea, leg swelling and PND.  Gastrointestinal:  Negative for abdominal pain, constipation, diarrhea, heartburn, nausea and vomiting.  Genitourinary:  Negative for dysuria, hematuria and urgency.  Musculoskeletal:  Negative for joint pain and myalgias.  Skin:  Negative for itching and rash.  Neurological:  Negative for dizziness, tingling, weakness and headaches.  Endo/Heme/Allergies:  Negative for environmental allergies. Does not bruise/bleed easily.   Psychiatric/Behavioral:  Negative for depression. The patient is not nervous/anxious and does not have insomnia.   All other systems reviewed and are negative.   Objective:  Physical Exam Vitals reviewed.  Constitutional:      General: He is not in acute distress.    Appearance: He is well-developed.  HENT:     Head: Normocephalic and atraumatic.  Eyes:     General: No scleral icterus.    Conjunctiva/sclera: Conjunctivae normal.     Pupils: Pupils are equal, round, and reactive to light.  Neck:     Vascular: No JVD.     Trachea: No tracheal deviation.  Cardiovascular:     Rate and Rhythm: Normal rate and regular rhythm.     Heart sounds: Normal heart sounds. No murmur heard. Pulmonary:     Effort: Pulmonary effort is normal. No tachypnea, accessory muscle usage or respiratory distress.     Breath sounds: No stridor. No wheezing, rhonchi or rales.     Comments: Diminished breath sounds in the bases  Abdominal:     General: Bowel sounds are normal. There is no distension.     Palpations: Abdomen is soft.     Tenderness: There is no abdominal tenderness.  Musculoskeletal:        General: No tenderness.     Cervical back: Neck supple.  Lymphadenopathy:     Cervical: No cervical adenopathy.  Skin:    General: Skin is warm and dry.     Capillary Refill: Capillary refill takes less than 2 seconds.     Findings: No rash.  Neurological:     Mental Status: He is alert and oriented to person, place, and time.  Psychiatric:        Behavior: Behavior normal.     Vitals:   07/27/21 1550  BP: 114/68  Pulse: 74  Temp: 98 F (36.7 C)  TempSrc: Oral  SpO2: 93%  Weight: 194 lb (88 kg)  Height: 6\' 1"  (1.854 m)   93% on RA BMI Readings from Last 3 Encounters:  07/27/21 25.60 kg/m  07/11/21 25.60 kg/m  07/01/21 25.86 kg/m   Wt Readings from Last 3 Encounters:  07/27/21 194 lb (88 kg)  07/11/21 194 lb (88 kg)  07/01/21 196 lb (88.9 kg)     CBC    Component Value  Date/Time   WBC 13.6 (H) 07/11/2021 1455   RBC 4.39 07/11/2021 1455   HGB 13.4 07/11/2021 1455   HGB 14.3 02/09/2021 1136  HGB 12.4 (L) 05/16/2020 1615   HGB 11.8 (L) 03/07/2016 1213   HCT 40.2 07/11/2021 1455   HCT 37.9 05/16/2020 1615   HCT 36.3 (L) 03/07/2016 1213   PLT 436.0 (H) 07/11/2021 1455   PLT 248 02/09/2021 1136   PLT 329 05/16/2020 1615   MCV 91.6 07/11/2021 1455   MCV 94 05/16/2020 1615   MCV 85.0 03/07/2016 1213   MCH 31.5 07/03/2021 0510   MCHC 33.4 07/11/2021 1455   RDW 15.0 07/11/2021 1455   RDW 14.2 05/16/2020 1615   RDW 14.7 (H) 03/07/2016 1213   LYMPHSABS 0.8 07/11/2021 1455   LYMPHSABS 0.9 05/16/2020 1615   LYMPHSABS 0.8 (L) 03/07/2016 1213   MONOABS 0.6 07/11/2021 1455   MONOABS 0.8 03/07/2016 1213   EOSABS 0.1 07/11/2021 1455   EOSABS 0.1 05/16/2020 1615   BASOSABS 0.0 07/11/2021 1455   BASOSABS 0.0 05/16/2020 1615   BASOSABS 0.0 03/07/2016 1213    Chest Imaging: 02/08/2021: Nuclear medicine pet imaging Hypermetabolic bandlike nodules within the right upper lobe along the fissure.  A smaller 13 mm nodule anterior right upper lobe.  Also has a right superior mediastinal node just adjacent to the SVC. All have been followed for some time on CT imaging concern for potential malignancy. The patient's images have been independently reviewed by me.    03/29/2021: CT scan of the chest: Stable right upper lobe pulmonary nodule. The patient's images have been independently reviewed by me.   CT chest 06/30/2021: Loculated effusion, stable scattered nodular changes Slightly enlarged left paratracheal lymph node. The patient's images have been independently reviewed by me.    Pulmonary Functions Testing Results: PFT Results Latest Ref Rng & Units 08/13/2017 10/04/2016  FVC-Pre L 3.10 3.14  FVC-Predicted Pre % 67 67  FVC-Post L - 3.26  FVC-Predicted Post % - 70  Pre FEV1/FVC % % 64 62  Post FEV1/FCV % % - 64  FEV1-Pre L 1.98 1.96  FEV1-Predicted Pre %  59 58  FEV1-Post L - 2.10  DLCO uncorrected ml/min/mmHg 12.40 14.56  DLCO UNC% % 34 40  DLCO corrected ml/min/mmHg - 14.44  DLCO COR %Predicted % - 40  DLVA Predicted % 56 59  TLC L 7.39 5.77  TLC % Predicted % 97 76  RV % Predicted % 156 92    FeNO:   Pathology:   Echocardiogram:   Heart Catheterization:     Assessment & Plan:     ICD-10-CM   1. Lung nodule  R91.1     2. Personal history of colon cancer  Z85.038     3. Adenopathy  R59.9    Left paratracheal node      4. Pleural effusion on right  J90       Discussion:  This is an 80 year old gentleman, history of lung cancer status post right lower lobectomy, colon cancer.  Followed by medical oncology had new nodules in the upper lobe which showed some low-level PET activity in the past.  These were felt to be inflammatory.  Follow-up CT imaging in the hospital revealed partial lobe resolution of these.  Unfortunately did have a new right-sided pleural effusion that was tapped with no evidence of malignancy in the effusion.  Also found to have enlarging left paratracheal lymph node.  Plan: I think he needs short-term follow-up CT imaging. We discussed the pros and cons of neck steps in the office today.  I think we follow this closely if the effusion recurs or if the nodule  in the chest gets bigger I think he could consider bronchoscopy with endobronchial ultrasound sampling of the lymph node. If needed we can also retap his pleural effusion. Patient is agreeable to this plan. Will have repeat CT imaging 3 months. Patient to follow-up with [name] after the CT imaging complete.    Current Outpatient Medications:    acetaminophen (TYLENOL) 325 MG tablet, Take 650 mg by mouth every 6 (six) hours as needed for moderate pain or headache., Disp: , Rfl:    albuterol (VENTOLIN HFA) 108 (90 Base) MCG/ACT inhaler, Inhale 2 puffs into the lungs every 6 (six) hours as needed for wheezing or shortness of breath., Disp: 8.5 g, Rfl:  0   amLODipine (NORVASC) 5 MG tablet, TAKE ONE TABLET ONCE DAILY, (Patient taking differently: Take 5 mg by mouth daily.), Disp: 60 tablet, Rfl: 2   benzonatate (TESSALON) 200 MG capsule, Take 1 capsule (200 mg total) by mouth 3 (three) times daily as needed for cough., Disp: 20 capsule, Rfl: 0   Brinzolamide-Brimonidine (SIMBRINZA) 1-0.2 % SUSP, Place 1 drop into both eyes 2 times daily., Disp: , Rfl:    docusate sodium (COLACE) 100 MG capsule, Take 100 mg by mouth 2 (two) times daily. , Disp: , Rfl:    ELIQUIS 5 MG TABS tablet, TAKE ONE TABLET BY MOUTH TWICE DAILY. (Patient taking differently: Take 5 mg by mouth 2 (two) times daily.), Disp: 120 tablet, Rfl: 3   empagliflozin (JARDIANCE) 10 MG TABS tablet, Take 1 tablet (10 mg total) by mouth daily before breakfast., Disp: 30 tablet, Rfl: 11   escitalopram (LEXAPRO) 10 MG tablet, TAKE ONE TABLET ONCE DAILY (Patient taking differently: Take 10 mg by mouth daily.), Disp: 28 tablet, Rfl: 5   furosemide (LASIX) 20 MG tablet, Take 3 tablets (60 mg total) by mouth 2 (two) times daily., Disp: 180 tablet, Rfl: 11   latanoprost (XALATAN) 0.005 % ophthalmic solution, Place 1 drop into both eyes at bedtime., Disp: , Rfl:    losartan (COZAAR) 25 MG tablet, Take 1 tablet (25 mg total) by mouth daily., Disp: 90 tablet, Rfl: 3   Melatonin 10 MG CAPS, Take 10 mg by mouth at bedtime., Disp: , Rfl:    metoprolol succinate (TOPROL-XL) 25 MG 24 hr tablet, TAKE 1/2 TABLET IN THE EVENING WITH OR IMMEDIATELY FOLLOWING A MEAL (Patient taking differently: Take 12.5 mg by mouth daily.), Disp: 30 tablet, Rfl: 3   montelukast (SINGULAIR) 10 MG tablet, TAKE ONE TABLET AT BEDTIME. (Patient taking differently: Take 10 mg by mouth at bedtime.), Disp: 28 tablet, Rfl: 5   Multiple Vitamins-Minerals (CENTRUM SILVER PO), Take 1 tablet by mouth daily., Disp: , Rfl:    omeprazole (PRILOSEC) 20 MG capsule, TAKE (1) CAPSULE TWICE DAILY. (Patient taking differently: Take 20 mg by mouth 2  (two) times daily.), Disp: 56 capsule, Rfl: 3   potassium chloride SA (KLOR-CON) 20 MEQ tablet, TAKE ONE TABLET ONCE DAILY. (Patient taking differently: 20 mEq daily.), Disp: 60 tablet, Rfl: 3   predniSONE (DELTASONE) 10 MG tablet, Take 4 tablets (40 mg) by mouth daily for 1 day, 3 tablets (30 mg) daily for 1 day, 2 tablets (20 mg) daily for 1 days,1 tablet (10 mg) daily for 1 day, then stop, Disp: 10 tablet, Rfl: 0   rosuvastatin (CRESTOR) 40 MG tablet, TAKE ONE TABLET ONCE DAILY (Patient taking differently: Take 40 mg by mouth daily.), Disp: 60 tablet, Rfl: 3   senna (SENOKOT) 8.6 MG tablet, Take 1 tablet by mouth 2 (  two) times daily., Disp: , Rfl:    SYMBICORT 160-4.5 MCG/ACT inhaler, USE 2 PUFFS TWICE DAILY. (Patient taking differently: Inhale 2 puffs into the lungs 2 (two) times daily.), Disp: 10.2 g, Rfl: 11   tiotropium (SPIRIVA HANDIHALER) 18 MCG inhalation capsule, Place 1 capsule (18 mcg total) into inhaler and inhale daily., Disp: 30 capsule, Rfl: 0   Garner Nash, DO [facility] 07/27/2021 3:57 PM

## 2021-07-27 NOTE — Patient Instructions (Addendum)
Thank you for visiting Dr. Valeta Harms at [facility]. Today we recommend the following:  Move CT Chest to January 2023  Stop other inhalers Start Breztri samples, new prescription and spacer   Return in about 3 months (around 10/27/2021) for with Eric Form, NP, or Dr. Valeta Harms.    Please do your part to reduce the spread of COVID-19.

## 2021-07-28 DIAGNOSIS — J439 Emphysema, unspecified: Secondary | ICD-10-CM | POA: Diagnosis not present

## 2021-07-28 DIAGNOSIS — Z9181 History of falling: Secondary | ICD-10-CM | POA: Diagnosis not present

## 2021-07-28 DIAGNOSIS — I739 Peripheral vascular disease, unspecified: Secondary | ICD-10-CM | POA: Diagnosis not present

## 2021-07-28 DIAGNOSIS — F32A Depression, unspecified: Secondary | ICD-10-CM | POA: Diagnosis not present

## 2021-07-28 DIAGNOSIS — G4733 Obstructive sleep apnea (adult) (pediatric): Secondary | ICD-10-CM | POA: Diagnosis not present

## 2021-07-28 DIAGNOSIS — N1831 Chronic kidney disease, stage 3a: Secondary | ICD-10-CM | POA: Diagnosis not present

## 2021-07-28 DIAGNOSIS — I5032 Chronic diastolic (congestive) heart failure: Secondary | ICD-10-CM | POA: Diagnosis not present

## 2021-07-28 DIAGNOSIS — I480 Paroxysmal atrial fibrillation: Secondary | ICD-10-CM | POA: Diagnosis not present

## 2021-07-28 DIAGNOSIS — E785 Hyperlipidemia, unspecified: Secondary | ICD-10-CM | POA: Diagnosis not present

## 2021-07-28 DIAGNOSIS — I6523 Occlusion and stenosis of bilateral carotid arteries: Secondary | ICD-10-CM | POA: Diagnosis not present

## 2021-07-28 DIAGNOSIS — M4800 Spinal stenosis, site unspecified: Secondary | ICD-10-CM | POA: Diagnosis not present

## 2021-07-28 DIAGNOSIS — I130 Hypertensive heart and chronic kidney disease with heart failure and stage 1 through stage 4 chronic kidney disease, or unspecified chronic kidney disease: Secondary | ICD-10-CM | POA: Diagnosis not present

## 2021-07-28 DIAGNOSIS — M1990 Unspecified osteoarthritis, unspecified site: Secondary | ICD-10-CM | POA: Diagnosis not present

## 2021-07-28 DIAGNOSIS — Z7901 Long term (current) use of anticoagulants: Secondary | ICD-10-CM | POA: Diagnosis not present

## 2021-07-28 DIAGNOSIS — J9601 Acute respiratory failure with hypoxia: Secondary | ICD-10-CM | POA: Diagnosis not present

## 2021-07-28 DIAGNOSIS — N400 Enlarged prostate without lower urinary tract symptoms: Secondary | ICD-10-CM | POA: Diagnosis not present

## 2021-07-28 DIAGNOSIS — U071 COVID-19: Secondary | ICD-10-CM | POA: Diagnosis not present

## 2021-07-28 DIAGNOSIS — Z7952 Long term (current) use of systemic steroids: Secondary | ICD-10-CM | POA: Diagnosis not present

## 2021-07-28 DIAGNOSIS — Z7951 Long term (current) use of inhaled steroids: Secondary | ICD-10-CM | POA: Diagnosis not present

## 2021-07-28 DIAGNOSIS — Z87891 Personal history of nicotine dependence: Secondary | ICD-10-CM | POA: Diagnosis not present

## 2021-08-02 ENCOUNTER — Ambulatory Visit: Payer: PPO | Admitting: Internal Medicine

## 2021-08-02 LAB — CUP PACEART REMOTE DEVICE CHECK
Date Time Interrogation Session: 20221102012116
Implantable Pulse Generator Implant Date: 20200709

## 2021-08-07 ENCOUNTER — Ambulatory Visit (INDEPENDENT_AMBULATORY_CARE_PROVIDER_SITE_OTHER): Payer: PPO

## 2021-08-07 DIAGNOSIS — I480 Paroxysmal atrial fibrillation: Secondary | ICD-10-CM | POA: Diagnosis not present

## 2021-08-11 NOTE — Progress Notes (Signed)
Carelink Summary Report / Loop Recorder 

## 2021-08-21 ENCOUNTER — Ambulatory Visit (HOSPITAL_COMMUNITY)
Admission: RE | Admit: 2021-08-21 | Discharge: 2021-08-21 | Disposition: A | Discharge status: Home / Self Care | Payer: PPO | Source: Ambulatory Visit | Attending: Cardiology | Admitting: Cardiology

## 2021-08-21 ENCOUNTER — Encounter (HOSPITAL_COMMUNITY): Payer: Self-pay | Admitting: Cardiology

## 2021-08-21 ENCOUNTER — Other Ambulatory Visit (HOSPITAL_COMMUNITY): Payer: Self-pay

## 2021-08-21 VITALS — BP 130/60 | HR 75 | Wt 195.0 lb

## 2021-08-21 DIAGNOSIS — Z85118 Personal history of other malignant neoplasm of bronchus and lung: Secondary | ICD-10-CM | POA: Insufficient documentation

## 2021-08-21 DIAGNOSIS — N183 Chronic kidney disease, stage 3 unspecified: Secondary | ICD-10-CM | POA: Diagnosis not present

## 2021-08-21 DIAGNOSIS — I5032 Chronic diastolic (congestive) heart failure: Secondary | ICD-10-CM | POA: Diagnosis not present

## 2021-08-21 DIAGNOSIS — E785 Hyperlipidemia, unspecified: Secondary | ICD-10-CM | POA: Insufficient documentation

## 2021-08-21 DIAGNOSIS — Z7984 Long term (current) use of oral hypoglycemic drugs: Secondary | ICD-10-CM | POA: Diagnosis not present

## 2021-08-21 DIAGNOSIS — Z7901 Long term (current) use of anticoagulants: Secondary | ICD-10-CM | POA: Insufficient documentation

## 2021-08-21 DIAGNOSIS — I480 Paroxysmal atrial fibrillation: Secondary | ICD-10-CM

## 2021-08-21 DIAGNOSIS — Z85038 Personal history of other malignant neoplasm of large intestine: Secondary | ICD-10-CM | POA: Insufficient documentation

## 2021-08-21 DIAGNOSIS — E0590 Thyrotoxicosis, unspecified without thyrotoxic crisis or storm: Secondary | ICD-10-CM | POA: Diagnosis not present

## 2021-08-21 DIAGNOSIS — I739 Peripheral vascular disease, unspecified: Secondary | ICD-10-CM | POA: Diagnosis not present

## 2021-08-21 DIAGNOSIS — Z8616 Personal history of COVID-19: Secondary | ICD-10-CM | POA: Diagnosis not present

## 2021-08-21 DIAGNOSIS — J90 Pleural effusion, not elsewhere classified: Secondary | ICD-10-CM | POA: Diagnosis not present

## 2021-08-21 DIAGNOSIS — Z79899 Other long term (current) drug therapy: Secondary | ICD-10-CM | POA: Diagnosis not present

## 2021-08-21 DIAGNOSIS — I714 Abdominal aortic aneurysm, without rupture, unspecified: Secondary | ICD-10-CM | POA: Diagnosis not present

## 2021-08-21 DIAGNOSIS — I130 Hypertensive heart and chronic kidney disease with heart failure and stage 1 through stage 4 chronic kidney disease, or unspecified chronic kidney disease: Secondary | ICD-10-CM | POA: Insufficient documentation

## 2021-08-21 DIAGNOSIS — Z902 Acquired absence of lung [part of]: Secondary | ICD-10-CM | POA: Insufficient documentation

## 2021-08-21 DIAGNOSIS — I6521 Occlusion and stenosis of right carotid artery: Secondary | ICD-10-CM | POA: Insufficient documentation

## 2021-08-21 DIAGNOSIS — I2510 Atherosclerotic heart disease of native coronary artery without angina pectoris: Secondary | ICD-10-CM | POA: Insufficient documentation

## 2021-08-21 LAB — BASIC METABOLIC PANEL
Anion gap: 12 (ref 5–15)
BUN: 12 mg/dL (ref 8–23)
CO2: 23 mmol/L (ref 22–32)
Calcium: 8.7 mg/dL — ABNORMAL LOW (ref 8.9–10.3)
Chloride: 101 mmol/L (ref 98–111)
Creatinine, Ser: 1.51 mg/dL — ABNORMAL HIGH (ref 0.61–1.24)
GFR, Estimated: 46 mL/min — ABNORMAL LOW (ref >60)
Glucose, Bld: 178 mg/dL — ABNORMAL HIGH (ref 70–99)
Potassium: 3.1 mmol/L — ABNORMAL LOW (ref 3.5–5.1)
Sodium: 136 mmol/L (ref 135–145)

## 2021-08-21 LAB — BRAIN NATRIURETIC PEPTIDE: B Natriuretic Peptide: 175.6 pg/mL — ABNORMAL HIGH (ref 0.0–100.0)

## 2021-08-21 MED ORDER — ICOSAPENT ETHYL 1 G PO CAPS: 2.0000 g | ORAL_CAPSULE | Freq: Two times a day (BID) | ORAL | 11 refills | Status: DC

## 2021-08-21 NOTE — Patient Instructions (Signed)
EKG done today.  Labs done today. We will contact you only if your labs are abnormal.  START Vascepa 2g (2 capsules) by mouth 2 times daily.   PLEASE TAKE YOUR EVENING DOSE OF LASIX AT 4PM.   No other medication changes were made. Please continue all current medications as prescribed.  Your physician recommends that you schedule a follow-up appointment in: 2 months  If you have any questions or concerns before your next appointment please send [name] a message through [place] or call our office at [phone].    TO LEAVE A MESSAGE FOR THE NURSE SELECT OPTION 2, PLEASE LEAVE A MESSAGE INCLUDING: YOUR NAME DATE OF BIRTH CALL BACK NUMBER REASON FOR CALL**this is important as we prioritize the call backs  YOU WILL RECEIVE A CALL BACK THE SAME DAY AS LONG AS YOU CALL BEFORE 4:00 PM   Do the following things EVERYDAY: Weigh yourself in the morning before breakfast. Write it down and keep it in a log. Take your medicines as prescribed Eat low salt foods--Limit salt (sodium) to 2000 mg per day.  Stay as active as you can everyday Limit all fluids for the day to less than 2 liters   At the [facility], you and your health needs are our priority. As part of our continuing mission to provide you with exceptional heart care, we have created designated Provider Care Teams. These Care Teams include your primary Cardiologist (physician) and Advanced Practice Providers (APPs- Physician Assistants and Nurse Practitioners) who all work together to provide you with the care you need, when you need it.   You may see any of the following providers on your designated Care Team at your next follow up: Dr Glori Bickers Dr Haynes Kerns, NP Lyda Jester, [place] Audry Riles, PharmD   Please be sure to bring in all your medications bottles to every appointment.

## 2021-08-22 ENCOUNTER — Other Ambulatory Visit (HOSPITAL_COMMUNITY): Payer: Self-pay

## 2021-08-22 ENCOUNTER — Telehealth (HOSPITAL_COMMUNITY): Payer: Self-pay | Admitting: Surgery

## 2021-08-22 DIAGNOSIS — R0602 Shortness of breath: Secondary | ICD-10-CM

## 2021-08-22 DIAGNOSIS — I509 Heart failure, unspecified: Secondary | ICD-10-CM

## 2021-08-22 MED ORDER — VASCEPA 1 G PO CAPS: 2.0000 g | ORAL_CAPSULE | Freq: Two times a day (BID) | ORAL | 11 refills | Status: DC

## 2021-08-22 MED ORDER — POTASSIUM CHLORIDE CRYS ER 20 MEQ PO TBCR
40.0000 meq | EXTENDED_RELEASE_TABLET | Freq: Every day | ORAL | 3 refills | Status: DC
Start: 2021-08-22 — End: 2021-10-24

## 2021-08-22 NOTE — Telephone Encounter (Signed)
Patient contacted,results reviewed and daughter tells me that he does have Potassium 20 meq in his pill packs and is taking daily.  I will change medication list and send the new Potassium prescription to pharmacy of choice.  Appt scheduled for lab recheck on Tuesday Nov 29th.

## 2021-08-22 NOTE — Progress Notes (Signed)
Patient ID: Troy Pellerito [name], male   DOB: 1940-11-22, 80 y.o.   MRN: [id number] PCP: Dr. Lawerance Bach Cardiology: Dr. Shirlee Latch  80 y.o. with history of HTN, COPD, active smoking/COPD, carotid stenosis s/p right CEA, paroxysmal atrial fibrillation, and PAD.   He does not have known obstructive CAD but is at high risk for CAD based on his comorbidities.  Lexiscan Cardiolite in 3/14 showed no ischemia or infarction and echo in 3/14 showed normal EF.  He had a left fem-pop bypass at the [facility] in the '90s.  He is followed at [facility] for PAD. He has chronic right calf/thigh/buttocks claudication that is unchanged over the last few years and follows regularly at [facility].  He has had right lower lobectomy for lung cancer.  He continues to stay off cigarettes.    At a prior appointment, he was in atrial fibrillation but did not realize it. Dr Shirlee Latch  started him on Eliquis and diltiazem CD, and he spontaneously converted to NSR.  In 5/15, he was at [facility] and felt "strange" one day: fatigued, weak, short of breath.  He went to the ER and was in atrial fibrillation with HR in 80s-90s.  He spontaneously converted to NSR in the ER.  He felt back to normal after converting to NSR.  Started on Multaq 400 mg bid.  With CHF, he was eventually transitioned over to amiodarone.   He had patch angioplasty revision of left fem-pop bypass in 11/16.  Now with minimal claudication.  He follows with [facility].     He had a Cardiolite and echo in 7/17 that were unremarkable.  Repeat Cardiolite in 12/17 showed no ischemia, echo was uninterpretable.  Cardiac MRI was therefore done in 1/18, showing EF 66% with normal-appearing RV, no late gadolinium enhancement.   Given increased exertional dyspnea and a defect on Cardiolite, he had LHC in 11/18.  This showed no obstructive CAD. He had a chest CT done given concern for possible amiodarone lung toxicity with increased dyspnea. This showed emphysema, no ILD.    In 5/19, he developed a  probable viral syndrome with dehydration and had a presyncopal episode.  He was orthostatic in the hospital.  He was noted to be in atrial fibrillation this admission which remained persistent.  He had a TEE-guided DCCV back to NSR.   In 9/19, he was admitted with GI bleeding and found to have a sigmoid mass on colonoscopy, path showed sigmoid adenocarcinoma.  He was noted to have right paratracheal lymphadenopathy but EUS with biopsy in 10/19 did not show malignancy.  He had surgical resection of colon cancer.   Amiodarone was stopped with elevated ESR and increased dyspnea.  He was also found to have hyperthyroidism. He was treated with methimazole by endocrinology.    He had atrial fibrillation ablation in 7/20.   Echo in 4/21 showed EF 60-65%, normal RV.    In 5/21, due to high atrial fibrillation burden, he had redo atrial fibrillation ablation.   Cardiolite in 6/21 showed EF 70%, no ischemia/infarction.   He was admitted in 10/22 with COVID-19 and COPD exacerbation.  He had a thoracentesis, cytology was negative for cancerous cells.   He returns today for followup of CHF.  He is in NSR today.  Weight down 7 lbs.  Breathing is "up and down," but he is starting to recover from COVID.  Legs feel weak but he denies claudication.  No chest pain.  He walks in his driveway for exercise.  No dyspnea walking around the house or up his driveway.  He says that Lasix has been keeping him up at night.   ECG (personally reviewed): NSR, RBBB  Labs (3/13): LDL 75, HDL 35, K 4.1, creatinine 1.1 Labs (3/14): LDL 91, HDL 27 Labs (10/14): K 4.3, creatinine 0.98 Labs (4/15): K 4.9, creatinine 1.1, LDL 76, HDL 30 Labs (5/15): K 4.3, creatinine 1.7, BNP 251 Labs (6/15): K 4.4, creatinine 1.3 Labs (10/15): TSH normal Labs (5/16): K 4.2, creatinine 1.12, HCT 39.5, LFTs normal, LDL 84, HDL 43, TSH normal Labs (11/16): creatinine 0.94 Labs (4/17): LDL 39, HDL 39 Labs (6/17): K 4.5, creatinine 1.2 Labs  (9/17): K 4, creatinine 1.25, HCT 38.7 Labs (12/17): K 4.5, creatinine 1.49 => 1.62, BNP 43 Labs (2/18): K 3.8, creatinine 1.33, LFTs normal, TSH normal Labs (6/18): K 4.3, creatinine 1.49, LFTs normal, TSH normal, hgb 12.3 Labs (11/18): ESR 60, TSH normal, LFTs normal, creatinine 1.34 Labs (1/19): LDL 50 Labs (5/19): K 4.6, creatinine 1.55, LFTs normal, hgb 12.7, LFTs normal Labs (10/19): K 4 => 4.1, creatinine 1.39 => 1.43, BNP 279, hgb 10.7, TSH low, free T4 and free T3 low, ESR 108 Labs (3/21): K 4.5, creatinine 1.19, TSH normal Labs (4/21): K 4.8, creatinine 1.28, BNP 524, LDL 59 Labs (7/21): hgb 11.5, creatinine 1.4 Labs (8/21): K 4.3, creatinine 1.09 Labs (9/21): hgb 12.7 Labs (2/22): K 5, creatinine 1.54 Labs (5/22): K 4.2, creatinine 1.38, hgb 14.3, LDL 31 Labs (10/22): LDL 41, TGs 351, hgb 13.4, K 4.3, creatinine 1.57  PMH: 1. HTN: Fatigue and cough with ramipril use.  2. COPD: Quit smoking 2014. PFTs (1/18) with moderately severe COPD.  - CT chest (11/18): Emphysema noted, no ILD.  3. AAA: CT 1/13 with 3.0 cm AAA.  Abdominal US (1/14) with 3.4 cm AAA. Abdominal US (4/15) with 3.25 x 3.27 AAA. Abdominal US (3/16) with 3.7 cm AAA.  Followed at [facility].  - Abd [name] (1/19): 3.3 cm AAA. 4. Squamous cell lung cancer diagnosed 2/12.  Had right lower lobectomy in 4/12.  5. Hyperlipidemia 6. PAD: Left fem-pop bypass 1994.  ABIs (2/12) 0.62 on right, 0.95 on left. ABIs (1/14): 0.65 on right, 1.04 on left. ABIs (4/15) 0.66 right, 0.87 left.  ABIs (3/16) 0.6 right, 0.88 left.  Patch angioplasty left fem-pop bypass in 11/16.   - Left fem-pop bypass patent on doppler evaluation in 12/17.  - ABIs (2/21): normal on left (1.02), moderately decreased on right (0.54).  7. CAD: Stress myoview 2004 was normal. Lexiscan Cardiolite (3/14) with EF 66%, no ischemia or infarction. Lexiscan Cardiolite (7/17) with EF 61%, no ischemia/infarction.  - Cardiolite (12/17) with EF 62%, inferior/inferolateral fixed  defect, most likely diaphragmatic attenuation, no ischemia.  - Cardiolite (9/18) with EF > 65%, fixed inferior defect (attenuation versus infarction), no ischemia.  - LHC (11/18): No obstructive CAD.  - Cardiolite (6/21): EF 70%, no ischemia/infarction 8. Chronic diastolic CHF: Echo (9/32): technically difficult with EF 55-60%, upper normal RV size.  Echo (5/16) with EF 60-65%.  Echo (7/17) with EF 55-60%, normal RV size and systolic function.  - Echo 12/17 with very poor windows, unable to comment on LV or RV function.  - Cardiac MRI (1/18) with EF 66%, normal RV size and systolic function.  - TEE (5/19): EF 55-60%, normal RV size and systolic function.  - Echo (4/21): EF 60-65%, normal RV 9. Carotid stenosis: TIA 10/14.  Carotid dopplers with > 35% RICA, 57-32% LICA.  Patient had  right CEA in 10/14. Carotids (4/15) patent right CEA, LICA 28-78% stenosis.  Carotids (67/67) < 20% LICA, patent right CEA.  - Carotid dopplers (12/17): right CEA ok, < 94% LICA stenosis.  - Carotid dopplers (1/19): Right CEA ok, 7-09% LICA stenosis.  - Carotid dopplers (2/21): Right CEA ok, LICA 62-83% stenosis.  10. Atrial fibrillation: Paroxysmal. First noted after lobectomy in 4/12 (brief), recurrence in 4/15 then in 5/15.  - TEE-guided DCCV for persistent atrial fibrillation in 5/19.  - Atrial fibrillation ablation 7/20.  - Re-do atrial fibrillation ablation 5/21.  11. Spinal stenosis 12. OSA: Using CPAP.  13. Glaucoma 14. Has ILR 15. Colon cancer: Sigmoid adenocarcinoma, s/p surgical resection.  16. 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  17. COVID-19 10/22  SH: Married, lives in [place].  Former [place].  Quit smoking in 2014.  Rare ETOH now.   FH: No premature CAD  ROS: All systems reviewed and negative except as per HPI.   Current Outpatient Medications  Medication Sig Dispense Refill   acetaminophen (TYLENOL) 325 MG tablet Take 650 mg by mouth every 6 (six) hours  as needed for moderate pain or headache.     albuterol (VENTOLIN HFA) 108 (90 Base) MCG/ACT inhaler Inhale 2 puffs into the lungs every 6 (six) hours as needed for wheezing or shortness of breath. 8.5 g 0   amLODipine (NORVASC) 5 MG tablet TAKE ONE TABLET ONCE DAILY, 60 tablet 2   benzonatate (TESSALON) 200 MG capsule Take 1 capsule (200 mg total) by mouth 3 (three) times daily as needed for cough. 20 capsule 0   Brinzolamide-Brimonidine (SIMBRINZA) 1-0.2 % SUSP Place 1 drop into both eyes 2 times daily.     Budeson-Glycopyrrol-Formoterol (BREZTRI AEROSPHERE) 160-9-4.8 MCG/ACT AERO Inhale 2 puffs into the lungs in the morning and at bedtime. 10.7 g 0   docusate sodium (COLACE) 100 MG capsule Take 100 mg by mouth 2 (two) times daily.      ELIQUIS 5 MG TABS tablet TAKE ONE TABLET BY MOUTH TWICE DAILY. 120 tablet 3   empagliflozin (JARDIANCE) 10 MG TABS tablet Take 1 tablet (10 mg total) by mouth daily before breakfast. 30 tablet 11   escitalopram (LEXAPRO) 10 MG tablet TAKE ONE TABLET ONCE DAILY 28 tablet 5   furosemide (LASIX) 20 MG tablet Take 3 tablets (60 mg total) by mouth 2 (two) times daily. 180 tablet 11   latanoprost (XALATAN) 0.005 % ophthalmic solution Place 1 drop into both eyes at bedtime.     losartan (COZAAR) 25 MG tablet Take 1 tablet (25 mg total) by mouth daily. 90 tablet 3   Melatonin 10 MG CAPS Take 10 mg by mouth at bedtime.     metoprolol succinate (TOPROL-XL) 25 MG 24 hr tablet TAKE 1/2 TABLET IN THE EVENING WITH OR IMMEDIATELY FOLLOWING A MEAL 30 tablet 3   montelukast (SINGULAIR) 10 MG tablet TAKE ONE TABLET AT BEDTIME. 28 tablet 5   Multiple Vitamins-Minerals (CENTRUM SILVER PO) Take 1 tablet by mouth daily.     omeprazole (PRILOSEC) 20 MG capsule TAKE (1) CAPSULE TWICE DAILY. 56 capsule 3   rosuvastatin (CRESTOR) 40 MG tablet TAKE ONE TABLET ONCE DAILY 60 tablet 3   senna (SENOKOT) 8.6 MG tablet Take 1 tablet by mouth 2 (two) times daily.     potassium chloride SA  (KLOR-CON) 20 MEQ tablet Take 2 tablets (40 mEq total) by mouth daily. 60 tablet 3   VASCEPA 1 g capsule Take 2 capsules (2  g total) by mouth 2 (two) times daily. 120 capsule 11   No current facility-administered medications for this encounter.   Wt Readings from Last 3 Encounters:  08/21/21 88.5 kg (195 lb)  07/27/21 88 kg (194 lb)  07/11/21 88 kg (194 lb)    BP 130/60   Pulse 75   Wt 88.5 kg (195 lb)   SpO2 96%   BMI 25.73 kg/m    General: NAD Neck: No JVD, no thyromegaly or thyroid nodule.  Lungs: Distant BS CV: Nondisplaced PMI.  Heart regular S1/S2, no S3/S4, no murmur.  No peripheral edema.  No carotid bruit.  Normal pedal pulses.  Abdomen: Soft, nontender, no hepatosplenomegaly, no distention.  Skin: Intact without lesions or rashes.  Neurologic: Alert and oriented x 3.  Psych: Normal affect. Extremities: No clubbing or cyanosis.  HEENT: Normal.   Assessment/Plan: 1. Chronic diastolic CHF: Echo in 5/00 with EF 60-65%, normal RV.  NYHA class II-III symptoms, he does not look volume overloaded.    - Continue Lasix 60 mg bid but take the pm dose at 4 in the afternoon rather than at bedtime.  BMET/BNP today.  - Continue Jardiance 10 mg daily.  2. Hyperlipidemia: 10/22 lipids with good LDL, high TGs. - Continue Crestor 40 mg daily.  - Add Vascepa 2 g bid. Lipids 2 months.  3. Carotid stenosis: s/p R CEA.  Dopplers followed at [facility].  4. PAD: s/p patch angioplasty to left fem-pop bypass in 11/16.  Followed at [facility].  Moderately decreased ABI on right in 2/21, but no pain or pedal ulcerations.  Stable mild claudication.  - Followup with [facility]. 5. AAA: Stable on last [name], followed at [facility].    6. COPD: No longer smoking. PFTs in 1/18 showed moderately severe obstructive defect. CT chest in 11/18 showed emphysema.   7. Atrial fibrillation: S/p atrial fibrillation ablation in 7/20 and redo in 5/21.  He has been primarily in NSR since redo ablation.  He is in NSR today.  - Continue Toprol  XL 12.5 mg daily.  - Continue Eliquis 5 mg bid.  - Continue to limit ETOH.  - Continue to use CPAP.  8. CKD: Stage 3.   9. CAD: LHC in 11/18 with nonobstructive disease only.  Cardiolite in 6/21 with no ischemia/infarction. No chest pain.  10. 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  He is no longer on methimazole, follows with endocrinology.     11. Colon cancer: Sigmoid adenocarcinoma, s/p surgical resection. In remission.  12. HTN: Stable.   13. Pleural effusion: Chronic right pleural effusion, s/p right lower lobectomy.     14. Squamous cell lung cancer: S/p right lower lobectomy.  Follows with Dr. Valeta Harms.   Followup 2 months.   [name] 08/22/2021

## 2021-08-22 NOTE — Telephone Encounter (Signed)
-----   Message from Rafael Bihari, [place] sent at 08/21/2021  5:04 PM EST ----- K is low, kidney function stable. Has he been taking his daily KCL suppl? If so, increase to 40 mEq daily. Repeat BMET in 1 week

## 2021-08-29 ENCOUNTER — Ambulatory Visit (HOSPITAL_COMMUNITY)
Admission: RE | Admit: 2021-08-29 | Discharge: 2021-08-29 | Disposition: A | Discharge status: Home / Self Care | Payer: PPO | Source: Ambulatory Visit | Attending: Cardiology | Admitting: Cardiology

## 2021-08-29 ENCOUNTER — Other Ambulatory Visit: Payer: Self-pay

## 2021-08-29 DIAGNOSIS — I509 Heart failure, unspecified: Secondary | ICD-10-CM | POA: Diagnosis not present

## 2021-08-29 LAB — BASIC METABOLIC PANEL
Anion gap: 8 (ref 5–15)
BUN: 13 mg/dL (ref 8–23)
CO2: 27 mmol/L (ref 22–32)
Calcium: 8.9 mg/dL (ref 8.9–10.3)
Chloride: 101 mmol/L (ref 98–111)
Creatinine, Ser: 1.32 mg/dL — ABNORMAL HIGH (ref 0.61–1.24)
GFR, Estimated: 55 mL/min — ABNORMAL LOW (ref >60)
Glucose, Bld: 192 mg/dL — ABNORMAL HIGH (ref 70–99)
Potassium: 3.6 mmol/L (ref 3.5–5.1)
Sodium: 136 mmol/L (ref 135–145)

## 2021-09-11 ENCOUNTER — Ambulatory Visit (INDEPENDENT_AMBULATORY_CARE_PROVIDER_SITE_OTHER): Payer: PPO

## 2021-09-11 DIAGNOSIS — I480 Paroxysmal atrial fibrillation: Secondary | ICD-10-CM | POA: Diagnosis not present

## 2021-09-12 ENCOUNTER — Ambulatory Visit (HOSPITAL_COMMUNITY)
Admission: RE | Admit: 2021-09-12 | Discharge: 2021-09-12 | Disposition: A | Discharge status: Home / Self Care | Payer: PPO | Source: Ambulatory Visit | Attending: Pulmonary Disease | Admitting: Pulmonary Disease

## 2021-09-12 DIAGNOSIS — I700 Atherosclerosis of aorta: Secondary | ICD-10-CM | POA: Diagnosis not present

## 2021-09-12 DIAGNOSIS — J90 Pleural effusion, not elsewhere classified: Secondary | ICD-10-CM | POA: Diagnosis not present

## 2021-09-12 DIAGNOSIS — R911 Solitary pulmonary nodule: Secondary | ICD-10-CM | POA: Diagnosis not present

## 2021-09-12 DIAGNOSIS — J439 Emphysema, unspecified: Secondary | ICD-10-CM | POA: Diagnosis not present

## 2021-09-12 LAB — CUP PACEART REMOTE DEVICE CHECK
Date Time Interrogation Session: 20221205002642
Implantable Pulse Generator Implant Date: 20200709

## 2021-09-13 DIAGNOSIS — G4733 Obstructive sleep apnea (adult) (pediatric): Secondary | ICD-10-CM | POA: Diagnosis not present

## 2021-09-18 ENCOUNTER — Ambulatory Visit (INDEPENDENT_AMBULATORY_CARE_PROVIDER_SITE_OTHER): Payer: PPO

## 2021-09-18 DIAGNOSIS — Z0000 Encounter for general adult medical examination without abnormal findings: Secondary | ICD-10-CM | POA: Diagnosis not present

## 2021-09-18 NOTE — Progress Notes (Signed)
I connected with Troy [name]. today by telephone and verified that I am speaking with the correct person using two identifiers. Location patient: home Location provider: work Persons participating in the virtual visit: patient, provider.   I discussed the limitations, risks, security and privacy concerns of performing an evaluation and management service by telephone and the availability of in person appointments. I also discussed with the patient that there may be a patient responsible charge related to this service. The patient expressed understanding and verbally consented to this telephonic visit.    Interactive audio and video telecommunications were attempted between this provider and patient, however failed, due to patient having technical difficulties OR patient did not have access to video capability.  We continued and completed visit with audio only.  Some vital signs may be absent or patient reported.   Time Spent with patient on telephone encounter: 40 minutes  Subjective:   Troy [name]. is a 80 y.o. male who presents for Medicare Annual/Subsequent preventive examination.  Review of Systems     Cardiac Risk Factors include: advanced age (>46men, >59 women);dyslipidemia;family history of premature cardiovascular disease;hypertension;male gender     Objective:    There were no vitals filed for this visit. There is no height or weight on file to calculate BMI.  Advanced Directives 09/18/2021 07/03/2021 07/01/2021 06/30/2021 06/26/2021 02/09/2021 08/08/2020  Does Patient Have a Medical Advance Directive? Yes No No Yes Yes Yes Yes  Type of Advance Directive Living will;Healthcare Power of [place];Living will [place];Living will  Does patient want to make changes to medical advance directive? No - Patient declined - - - - - No - Patient declined  Copy of [place] in Chart? No - copy requested -  - - - - No - copy requested  Would patient like information on creating a medical advance directive? - No - Patient declined - - - - -  Pre-existing out of facility DNR order (yellow form or pink MOST form) - - - - - - -    Current Medications (verified) Outpatient Encounter Medications as of 09/18/2021  Medication Sig   acetaminophen (TYLENOL) 325 MG tablet Take 650 mg by mouth every 6 (six) hours as needed for moderate pain or headache.   albuterol (VENTOLIN HFA) 108 (90 Base) MCG/ACT inhaler Inhale 2 puffs into the lungs every 6 (six) hours as needed for wheezing or shortness of breath.   amLODipine (NORVASC) 5 MG tablet TAKE ONE TABLET ONCE DAILY,   benzonatate (TESSALON) 200 MG capsule Take 1 capsule (200 mg total) by mouth 3 (three) times daily as needed for cough.   Brinzolamide-Brimonidine (SIMBRINZA) 1-0.2 % SUSP Place 1 drop into both eyes 2 times daily.   Budeson-Glycopyrrol-Formoterol (BREZTRI AEROSPHERE) 160-9-4.8 MCG/ACT AERO Inhale 2 puffs into the lungs in the morning and at bedtime.   docusate sodium (COLACE) 100 MG capsule Take 100 mg by mouth 2 (two) times daily.    ELIQUIS 5 MG TABS tablet TAKE ONE TABLET BY MOUTH TWICE DAILY.   empagliflozin (JARDIANCE) 10 MG TABS tablet Take 1 tablet (10 mg total) by mouth daily before breakfast.   escitalopram (LEXAPRO) 10 MG tablet TAKE ONE TABLET ONCE DAILY   furosemide (LASIX) 20 MG tablet Take 3 tablets (60 mg total) by mouth 2 (two) times daily.   latanoprost (XALATAN) 0.005 % ophthalmic solution Place 1 drop into both eyes at bedtime.   losartan (COZAAR)  25 MG tablet Take 1 tablet (25 mg total) by mouth daily.   Melatonin 10 MG CAPS Take 10 mg by mouth at bedtime.   metoprolol succinate (TOPROL-XL) 25 MG 24 hr tablet TAKE 1/2 TABLET IN THE EVENING WITH OR IMMEDIATELY FOLLOWING A MEAL   montelukast (SINGULAIR) 10 MG tablet TAKE ONE TABLET AT BEDTIME.   Multiple Vitamins-Minerals (CENTRUM SILVER PO) Take 1 tablet by mouth daily.    omeprazole (PRILOSEC) 20 MG capsule TAKE (1) CAPSULE TWICE DAILY.   potassium chloride SA (KLOR-CON) 20 MEQ tablet Take 2 tablets (40 mEq total) by mouth daily.   rosuvastatin (CRESTOR) 40 MG tablet TAKE ONE TABLET ONCE DAILY   senna (SENOKOT) 8.6 MG tablet Take 1 tablet by mouth 2 (two) times daily.   VASCEPA 1 g capsule Take 2 capsules (2 g total) by mouth 2 (two) times daily.   No facility-administered encounter medications on file as of 09/18/2021.    Allergies (verified) Niacin-lovastatin er, Penicillins, Sulfonamide derivatives, and Atorvastatin   History: Past Medical History:  Diagnosis Date   AAA (abdominal aortic aneurysm) LAST ABDOMINAL US 10-20-17 3.3 CM   MONITORED BY DR [name]   Allergy    Anxiety    Arthritis    Atrial fibrillation (HCC)    Basal cell carcinoma    Bilateral carotid artery stenosis DUPLEX 12-29-2012  BY DR [facility]   BILATERAL ICA STENOSIS 60-79%   BPH (benign prostatic hypertrophy)    Cataract    removed both eyes   CHF (congestive heart failure) (HCC)    Chronic diastolic heart failure (HCC)    Chronic kidney disease    stage 3, pt unaware   Colon cancer ([place])    Complication of anesthesia    had nausea after carotid artery surgery, states the medicine given to help the nausea, made it worse   Constipation    COPD (chronic obstructive pulmonary disease) (HCC)    Depression    Emphysema of lung (HCC)    GERD (gastroesophageal reflux disease)    Glaucoma BOTH EYES   Dr Gershon Crane   History of basal cell carcinoma excision    History of lung cancer APRIL 2012  SQUAMOUS CELL---- S/P RIGHT LOWER LOBECTOMY AT [facility] --  NO CHEMORADIATION---  NO RECURRENCE    ONCOLOGIST- DR Tressie Stalker  LOV IN [facility] 10-27-2012   History of pneumothorax    pt unaware   Hx of adenomatous colonic polyps 2005    X 2; 1 hyperplastic polyp; Dr Olevia Perches   Hyperlipidemia    Hypertension    Impaired fasting glucose 2007   108; A1c5.4%   Lesion of bladder    Lung cancer ([place])  01/25/2012   Microhematuria    OSA (obstructive sleep apnea) 08/29/2015   CPAP SET ON 10   PAD (peripheral artery disease) ([place]) ABI'S  JAN 2014  0.65 ON RIGHT ;  1.04 ON LEFT   Peripheral vascular disease (HCC) S/P ANGIOPLASTY AND STENTING   FOLLOWED  BY DR [name]   Pleural effusion on right 11/18/2018   Chronic, noted on CXR   Sleep apnea    + cpap    Spinal stenosis Sept. 2015   Status post placement of implantable loop recorder    Stroke [facility]) Jul 07, 2013   mini  TIA   Thoracic aorta atherosclerosis (HCC)    Thyrotoxicosis    amiodarone induced   Urgency of urination    Past Surgical History:  Procedure Laterality Date   ANGIO PLASTY  X 4 in legs   AORTOGRAM  07-27-2002   MILD DIFFUSE ILIAC ARTERY OCCLUSIVE DISEASE /  LEFT RENAL ARTERY 20%/ PATENT LEFT FEM-POP GRAFT/ MILD SFA AND POPLITEAL ARTERY OCCLUSIVE DISEASE W/ SEVERE KIDNEY OCCLUSIVE DISEASE   ATRIAL FIBRILLATION ABLATION N/A 04/09/2019   Procedure: ATRIAL FIBRILLATION ABLATION;  Surgeon: Thompson Grayer, MD;  Location: [place] CV [facility];  Service: Cardiovascular;  Laterality: N/A;   ATRIAL FIBRILLATION ABLATION N/A 02/12/2020   Procedure: ATRIAL FIBRILLATION ABLATION;  Surgeon: Thompson Grayer, MD;  Location: [place] [facility];  Service: Cardiovascular;  Laterality: N/A;   BASAL CELL CARCINOMA EXCISION     MULTIPLE TIMES--  RIGHT FOREARM, CHEEKS, AND BACK   BIOPSY  06/25/2018   Procedure: BIOPSY;  Surgeon: Irving Copas., MD;  Location: [place];  Service: Gastroenterology;;   CARDIOVASCULAR STRESS TEST  12-08-2012  DR [facility]   NORMAL LEXISCAN WITH NO EXERCISE NUCLEAR STUDY/ EF 66%/   NO ISCHEMIA/ NO SIGNIFICANT CHANGE FROM PRIOR STUDY   CARDIOVERSION N/A 02/14/2018   Procedure: CARDIOVERSION;  Surgeon: Larey Dresser, MD;  Location: [place];  Service: Cardiovascular;  Laterality: N/A;   CAROTID ANGIOGRAM N/A 07/10/2013   Procedure: CAROTID ANGIOGRAM;  Surgeon: Elam Dutch, MD;  Location: [facility]  CATH LAB;  Service: Cardiovascular;  Laterality: N/A;   CAROTID ENDARTERECTOMY Right 07-14-13   cea   CATARACT EXTRACTION W/ INTRAOCULAR LENS  IMPLANT, BILATERAL     colon polyectomy     COLONOSCOPY     COLONOSCOPY WITH PROPOFOL N/A 06/25/2018   Procedure: COLONOSCOPY WITH PROPOFOL;  Surgeon: Rush Landmark Telford Nab., MD;  Location: [place];  Service: Gastroenterology;  Laterality: N/A;   CYSTOSCOPY W/ RETROGRADES Bilateral 01/21/2013   Procedure: CYSTOSCOPY WITH RETROGRADE PYELOGRAM;  Surgeon: Molli Hazard, MD;  Location: [facility];  Service: Urology;  Laterality: Bilateral;   CYSTO, BLADDER BIOPSY, BILATERAL RETROGRADE PYELOGRAM  RAD TECH FROM RADIOLOGY PER JOY   CYSTOSCOPY WITH BIOPSY N/A 01/21/2013   Procedure: CYSTOSCOPY WITH BIOPSY;  Surgeon: Molli Hazard, MD;  Location: [facility];  Service: Urology;  Laterality: N/A;   ENDARTERECTOMY Right 07/14/2013   Procedure: ENDARTERECTOMY CAROTID;  Surgeon: Angelia Mould, MD;  Location: [place];  Service: Vascular;  Laterality: Right;   EP IMPLANTABLE DEVICE N/A 08/12/2015   Procedure: Loop Recorder Insertion;  Surgeon: Thompson Grayer, MD;  Location: [place] CV [facility];  Service: Cardiovascular;  Laterality: N/A;   ESOPHAGOGASTRODUODENOSCOPY (EGD) WITH PROPOFOL N/A 06/25/2018   Procedure: ESOPHAGOGASTRODUODENOSCOPY (EGD) WITH PROPOFOL;  Surgeon: Rush Landmark Telford Nab., MD;  Location: [place];  Service: Gastroenterology;  Laterality: N/A;   ESOPHAGOGASTRODUODENOSCOPY (EGD) WITH PROPOFOL N/A 07/10/2018   Procedure: ESOPHAGOGASTRODUODENOSCOPY (EGD) WITH PROPOFOL;  Surgeon: Milus Banister, MD;  Location: [facility];  Service: Endoscopy;  Laterality: N/A;   EUS N/A 07/10/2018   Procedure: UPPER ENDOSCOPIC ULTRASOUND (EUS) RADIAL;  Surgeon: Milus Banister, MD;  Location: [facility];  Service: Endoscopy;  Laterality: N/A;   EYE SURGERY Right    FEMORAL-POPLITEAL BYPASS GRAFT Left  [place]   AND 2001  IN [place]   FEMORAL-POPLITEAL BYPASS GRAFT Left 08/30/2015   Procedure: REVISION OF BYPASS GRAFT Left  FEMORAL-POPLITEAL ARTERY;  Surgeon: Angelia Mould, MD;  Location: [place];  Service: Vascular;  Laterality: Left;   FINE NEEDLE ASPIRATION N/A 07/10/2018   Procedure: FINE NEEDLE ASPIRATION (FNA) LINEAR;  Surgeon: Milus Banister, MD;  Location[facility];  Service: Endoscopy;  Laterality: N/A;  FLEXIBLE SIGMOIDOSCOPY N/A 12/12/2018   Procedure: FLEXIBLE SIGMOIDOSCOPY;  Surgeon: Ileana Roup, MD;  [facility]: [facility];  Service: General;  Laterality: N/A;   LARYNGOSCOPY  06-27-2004   BX VOCAL CORD  (LEUKOPLAKIA)  PER PT NO ISSUES SINCE   LEFT HEART CATH AND CORONARY ANGIOGRAPHY N/A 08/20/2017   Procedure: LEFT HEART CATH AND CORONARY ANGIOGRAPHY;  Surgeon: Larey Dresser, MD;  Location: [place] [facility];  Service: Cardiovascular;  Laterality: N/A;   LOOP RECORDER INSERTION N/A 04/09/2019   Procedure: LOOP RECORDER INSERTION;  Surgeon: Thompson Grayer, MD;  Location: [place] [facility];  Service: Cardiovascular;  Laterality: N/A;   LOOP RECORDER REMOVAL N/A 04/09/2019   Procedure: LOOP RECORDER REMOVAL;  Surgeon: Thompson Grayer, MD;  Location: [place] CV [facility];  Service: Cardiovascular;  Laterality: N/A;   LOWER EXTREMITY ANGIOGRAM Bilateral 08/29/2015   Procedure: Lower Extremity Angiogram;  Surgeon: Angelia Mould, MD;  Location: [place] [facility];  Service: Cardiovascular;  Laterality: Bilateral;   LUNG LOBECTOMY  01/24/2011    RIGHT UPPER LOBE  (SQUAMOUS CELL CARCINOMA) Dr Dorthea Cove , [facility]. No chemotherapyor radiation   PATCH ANGIOPLASTY Right 07/14/2013   Procedure: PATCH ANGIOPLASTY;  Surgeon: Angelia Mould, MD;  Location: [place];  Service: Vascular;  Laterality: Right;   PATCH ANGIOPLASTY Left 08/30/2015   Procedure: VEIN PATCH ANGIOPLASTY OF PROXIMAL Left BYPASS GRAFT;  Surgeon: Angelia Mould, MD;  Location: [place];   Service: Vascular;  Laterality: Left;   PERIPHERAL VASCULAR CATHETERIZATION N/A 08/29/2015   Procedure: Abdominal Aortogram;  Surgeon: Angelia Mould, MD;  Location: [place] [facility];  Service: Cardiovascular;  Laterality: N/A;   POLYPECTOMY  06/25/2018   Procedure: POLYPECTOMY;  Surgeon: Rush Landmark Telford Nab., MD;  Location: [place];  Service: Gastroenterology;;   POLYPECTOMY     SUBMUCOSAL INJECTION  06/25/2018   Procedure: SUBMUCOSAL INJECTION;  Surgeon: Irving Copas., MD;  Location: [place];  Service: Gastroenterology;;   TEE WITHOUT CARDIOVERSION N/A 02/14/2018   Procedure: TRANSESOPHAGEAL ECHOCARDIOGRAM (TEE);  Surgeon: Larey Dresser, MD;  Location: [facility] ENDOSCOPY;  Service: Cardiovascular;  Laterality: N/A;   trabecular surgery     OS   TRANSTHORACIC ECHOCARDIOGRAM  12-29-2012  DR [facility]   MILD LVH/  LVSF NORMAL/ EF 22-97%/  GRADE I DIASTOLIC DYSFUNCTION   Family History  Problem Relation Age of Onset   Stroke Mother        mini strokes   Alcohol abuse Father    Heart disease Father        MI after 63   Stroke Father    Hypertension Father    Heart attack Father    Heart disease Paternal Aunt        several   Hypertension Paternal Aunt        several   Stroke Paternal Aunt        several   Stroke Paternal Uncle        several   Heart disease Paternal Uncle        several;2 had MI pre 28   Cancer Daughter 2       breast ca, also with benign sessile polyp    Colon cancer Daughter 66   Colon polyps Neg Hx    Esophageal cancer Neg Hx    Rectal cancer Neg Hx    Stomach cancer Neg Hx    Social History   Socioeconomic History   Marital status: Married    Spouse name: [name]  Number of children: 3   Years of education: 12+   Highest education level: Not on file  Occupational History   Occupation: Retired    Comment: Owns a [employer], Advertising account executive, 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  Tobacco Use   Smoking status: Former    Packs/day: 0.50    Years: 50.00    Pack years: 25.00    Types: Cigarettes    Quit date: 07/28/2014    Years since quitting: 7.1   Smokeless tobacco: Never  Vaping Use   Vaping Use: Never used  Substance and Sexual Activity   Alcohol use: Yes    Alcohol/week: 4.0 standard drinks    Types: 1 Glasses of wine, 1 Cans of beer, 1 Shots of liquor, 1 Standard drinks or equivalent per week    Comment:  socially, variable   Drug use: No   Sexual activity: Not on file  Other Topics Concern   Not on file  Social History Narrative   Patient lives at home with spouse [name]   Patient has 3 children    Patient is right handed    Social Determinants of Health   Financial Resource Strain: Low Risk    Difficulty of Paying Living Expenses: Not hard at all  Food Insecurity: No Food Insecurity   Worried About Charity fundraiser in the Last Year: Never true   Arboriculturist in the Last Year: Never true  Transportation Needs: No Transportation Needs   Lack of Transportation (Medical): No   Lack of Transportation (Non-Medical): No  Physical Activity: Sufficiently Active   Days of Exercise per Week: 3 days   Minutes of Exercise per Session: 60 min  Stress: No Stress Concern Present   Feeling of Stress : Not at all  Social Connections: Socially Isolated   Frequency of Communication with Friends and Family: More than three times a week   Frequency of Social Gatherings with Friends and Family: Once a week   Attends Religious Services: Never   Marine scientist or Organizations: No   Attends Archivist Meetings: Never   Marital Status: Widowed    Tobacco Counseling Counseling given: Not Answered   Clinical Intake:  Pre-visit preparation completed: Yes  Pain : No/denies pain     Nutritional Risks: None Diabetes: No  How often do you need to have someone help you when you read instructions, pamphlets, or other written  materials from your doctor or pharmacy?: 1 - Never What is the last grade level you completed in school?: High School Graduate  Diabetic? no  Interpreter Needed?: No  Information entered by :: Lisette Abu, LPN   Activities of Daily Living In your present state of health, do you have any difficulty performing the following activities: 09/18/2021 07/03/2021  Hearing? N N  Vision? N Y  Difficulty concentrating or making decisions? N N  Walking or climbing stairs? N N  Dressing or bathing? N N  Doing errands, shopping? N Y  Conservation officer, nature and eating ? N -  Using the Toilet? N -  In the past six months, have you accidently leaked urine? N -  Do you have problems with loss of bowel control? N -  Managing your Medications? N -  Managing your Finances? N -  Housekeeping or managing your Housekeeping? N -  Some recent data might be hidden    Patient Care Team: Binnie Rail, MD  as PCP - General (Internal Medicine) Thompson Grayer, MD as PCP - Electrophysiology (Cardiology) Everardo All, MD (Hematology and Oncology) Lerry Paterson, MD as Referring Physician (Thoracic Surgery) Larey Dresser, MD as Consulting Physician (Cardiology) Susa Day, MD as Consulting Physician (Orthopedic Surgery) Bond, Tracie Harrier, MD as Referring Physician (Ophthalmology) Charlton Haws, [facility] as Pharmacist (Pharmacist)  Indicate any recent Medical Services you may have received from other than [facility] providers in the past year (date may be approximate).     Assessment:   This is a routine wellness examination for Troy [name].  Hearing/Vision screen Hearing Screening - Comments:: Patient wears hearing aids. Vision Screening - Comments:: Patient wears eyeglasses.  Annual exam done by: Raynelle Fanning, MD.  Dietary issues and exercise activities discussed: Current Exercise Habits: Structured exercise class (Works with a trainer 3 x a week), Type of exercise: walking;stretching;strength  training/weights;treadmill, Time (Minutes): 60, Frequency (Times/Week): 3, Weekly Exercise (Minutes/Week): 180, Intensity: Mild, Exercise limited by: cardiac condition(s);respiratory conditions(s);psychological condition(s)   Goals Addressed               This Visit's Progress     Patient Stated (pt-stated)        Patient declined health goal at this time.      Depression Screen PHQ 2/9 Scores 09/18/2021 08/02/2020 06/20/2018 03/21/2018 11/18/2017 11/04/2017 01/12/2015  PHQ - 2 Score 0 0 0 0 0 0 0  PHQ- 9 Score - - 0 1 - - -    Fall Risk Fall Risk  09/18/2021 08/08/2020 08/02/2020 03/21/2018 11/18/2017  Falls in the past year? 0 0 0 Yes Yes  Number falls in past yr: 0 0 0 2 or more 1  Injury with Fall? 0 0 0 Yes No  Risk for fall due to : No Fall Risks Impaired vision;No Fall Risks No Fall Risks Impaired balance/gait;History of fall(s);Impaired vision Impaired vision  Risk for fall due to: Comment - - - - has glaucoma, and macular degeneration  Follow up Falls prevention discussed Falls evaluation completed Falls evaluation completed Falls prevention discussed;Education provided Falls prevention discussed    FALL RISK PREVENTION PERTAINING TO THE HOME:  Any stairs in or around the home? Yes  If so, are there any without handrails? No  Home free of loose throw rugs in walkways, pet beds, electrical cords, etc? Yes  Adequate lighting in your home to reduce risk of falls? Yes   ASSISTIVE DEVICES UTILIZED TO PREVENT FALLS:  Life alert? No  Use of a cane, walker or w/c? No  Grab bars in the bathroom? Yes  Shower chair or bench in shower? Yes  Elevated toilet seat or a handicapped toilet? Yes   TIMED UP AND GO:  Was the test performed? No .  Length of time to ambulate 10 feet: n/a sec.   Gait steady and fast without use of assistive device  Cognitive Function: Normal cognitive status assessed by direct observation by this Nurse Health Advisor. No abnormalities found.      Montreal Cognitive Assessment  08/18/2014  Visuospatial/ Executive (0/5) 2  Naming (0/3) 3  Attention: Read list of digits (0/2) 2  Attention: Read list of letters (0/1) 1  Attention: Serial 7 subtraction starting at 100 (0/3) 3  Language: Repeat phrase (0/2) 2  Language : Fluency (0/1) 0  Abstraction (0/2) 2  Delayed Recall (0/5) 3  Orientation (0/6) 6  Total 24  Adjusted Score (based on education) 24      Immunizations Immunization History  Administered Date(s) Administered  Fluad Quad(high Dose 65+) 08/11/2020   Influenza Split 07/05/2017   Influenza Whole 07/28/2010   Influenza,inj,Quad PF,6+ Mos 07/07/2013, 07/30/2014   Influenza-Unspecified 07/02/2015   PFIZER(Purple Top)SARS-COV-2 Vaccination 10/18/2019, 11/08/2019, 06/14/2020, 04/14/2021   Pneumococcal Conjugate-13 11/28/2015   Pneumococcal Polysaccharide-23 10/02/2007   Td 03/14/2010    TDAP status: Due, Education has been provided regarding the importance of this vaccine. Advised may receive this vaccine at local pharmacy or Health Dept. Aware to provide a copy of the vaccination record if obtained from local pharmacy or Health Dept. Verbalized acceptance and understanding.  Flu Vaccine status: Due, Education has been provided regarding the importance of this vaccine. Advised may receive this vaccine at local pharmacy or Health Dept. Aware to provide a copy of the vaccination record if obtained from local pharmacy or Health Dept. Verbalized acceptance and understanding.  Pneumococcal vaccine status: Up to date  Covid-19 vaccine status: Completed vaccines  Qualifies for Shingles Vaccine? Yes   Zostavax completed No   Shingrix Completed?: No.    Education has been provided regarding the importance of this vaccine. Patient has been advised to call insurance company to determine out of pocket expense if they have not yet received this vaccine. Advised may also receive vaccine at local pharmacy or Health Dept.  Verbalized acceptance and understanding.  Screening Tests Health Maintenance  Topic Date Due   Zoster Vaccines- Shingrix (1 of 2) Never done   TETANUS/TDAP  03/14/2020   INFLUENZA VACCINE  05/01/2021   COVID-19 Vaccine (5 - Booster for Pfizer series) 06/09/2021   COLONOSCOPY (Pts 45-61yrs Insurance coverage will need to be confirmed)  06/24/2023   Pneumonia Vaccine 76+ Years old  Completed   HPV VACCINES  Aged Out    Health Maintenance  Health Maintenance Due  Topic Date Due   Zoster Vaccines- Shingrix (1 of 2) Never done   TETANUS/TDAP  03/14/2020   INFLUENZA VACCINE  05/01/2021   COVID-19 Vaccine (5 - Booster for Pfizer series) 06/09/2021    Colorectal cancer screening: Type of screening: Colonoscopy. Completed 06/23/2020. Repeat every 3 years  Lung Cancer Screening: (Low Dose CT Chest recommended if Age 18-80 years, 30 pack-year currently smoking OR have quit w/in 15years.) does not qualify.   Lung Cancer Screening Referral: no  Additional Screening:  Hepatitis C Screening: does not qualify; Completed no  Vision Screening: Recommended annual ophthalmology exams for early detection of glaucoma and other disorders of the eye. Is the patient up to date with their annual eye exam?  Yes  Who is the provider or what is the name of the office in which the patient attends annual eye exams? Raynelle Fanning, MD. If pt is not established with a provider, would they like to be referred to a provider to establish care? No .   Dental Screening: Recommended annual dental exams for proper oral hygiene  Community Resource Referral / Chronic Care Management: CRR required this visit?  No   CCM required this visit?  No      Plan:     I have personally reviewed and noted the following in the patients chart:   Medical and social history Use of alcohol, tobacco or illicit drugs  Current medications and supplements including opioid prescriptions. Patient is not currently taking opioid  prescriptions. Functional ability and status Nutritional status Physical activity Advanced directives List of other physicians Hospitalizations, surgeries, and ER visits in previous 12 months Vitals Screenings to include cognitive, depression, and falls Referrals and appointments  In addition, I  have reviewed and discussed with patient certain preventive protocols, quality metrics, and best practice recommendations. A written personalized care plan for preventive services as well as general preventive health recommendations were provided to patient.     Sheral Flow, LPN   [id number]   Nurse Notes:  Patient is cogitatively intact. There were no vitals filed for this visit. There is no height or weight on file to calculate BMI. Hearing Screening - Comments:: Patient wears hearing aids. Vision Screening - Comments:: Patient wears eyeglasses.  Annual exam done by: Raynelle Fanning, MD.

## 2021-09-21 NOTE — Progress Notes (Signed)
Carelink Summary Report / Loop Recorder 

## 2021-09-22 ENCOUNTER — Other Ambulatory Visit: Payer: Self-pay | Admitting: Internal Medicine

## 2021-10-08 NOTE — Progress Notes (Signed)
Cardiology Office Note Date:  10/10/2021  Patient ID:  Troy Schetter., DOB 05/31/1941, MRN [id number] PCP:  Binnie Rail, MD  Cardiologist:  [name] Electrophysiologist: [name] Pulmonary: Dr. Valeta Harms    Chief Complaint:  annual visit  History of Present Illness: Troy Touhey [name] is a 81 y.o. male with history of HTN, COPD/emphysema, lung Ca (s/p RLL lobectomy 2012), chronic R pleural effusion, PVD (s/p right CEA and left fem-pop bypass remotely with chronic claudication > patch angioplasty revision Nov 2016), Afib, colon cancer (resected 2019), no known CAD, chronic CHF (diastolic), hyperthyroidism (suspect 2/2 amiodarone), AAA, OSA w/CPAP   He comes in today to be seen for [name], last seen by him Nov 2021, doing well, AF burden 0%, remained on Eliquis, no changes made.  Most recently saw [name] 08/21/21, not felt to be volume OL, vascepa added for lipid management  TODAY He feels well SOB waxes/wanes, his trainer has been out of town for 2 weeks and has been pretty sedentary the last couple weeks, he attributes some SOB lately to that. No CP, palpitations or cardiac awareness, not sure he would be aware of any afib or not if he had some No near syncope or syncope. He does not have a sense that he is retaining fluid currently No bleeding or signs of bleeding  Device information MDT loop implanted 08/12/2015 extracted 04/09/2019 MDT LNQ II implanted 04/09/2019  AFib hx Diagnosed +/- 2015 Amiodarone, CT chest 2018 neg for ILD, stopped 2019 with continued SOB and elevated ESR, hyperthyroid PVI ablation 04/09/2019, PVI/CTI ablation EPS/ablation Feb 12, 2020, veins were quiet, LA standard box lesion created   Past Medical History:  Diagnosis Date   AAA (abdominal aortic aneurysm) LAST ABDOMINAL US 10-20-17 3.3 CM   MONITORED BY DR [name]   Allergy    Anxiety    Arthritis    Atrial fibrillation (HCC)    Basal cell carcinoma    Bilateral carotid  artery stenosis DUPLEX 12-29-2012  BY DR [facility]   BILATERAL ICA STENOSIS 60-79%   BPH (benign prostatic hypertrophy)    Cataract    removed both eyes   CHF (congestive heart failure) (HCC)    Chronic diastolic heart failure (HCC)    Chronic kidney disease    stage 3, pt unaware   Colon cancer ([place])    Complication of anesthesia    had nausea after carotid artery surgery, states the medicine given to help the nausea, made it worse   Constipation    COPD (chronic obstructive pulmonary disease) ([place])    Depression    Emphysema of lung ([place])    GERD (gastroesophageal reflux disease)    Glaucoma BOTH EYES   Dr Gershon Crane   History of basal cell carcinoma excision    History of lung cancer APRIL 2012  SQUAMOUS CELL---- S/P RIGHT LOWER LOBECTOMY AT [facility] --  NO CHEMORADIATION---  NO RECURRENCE    ONCOLOGIST- DR Tressie Stalker  LOV IN [facility] 10-27-2012   History of pneumothorax    pt unaware   Hx of adenomatous colonic polyps 2005    X 2; 1 hyperplastic polyp; Dr Olevia Perches   Hyperlipidemia    Hypertension    Impaired fasting glucose 2007   108; A1c5.4%   Lesion of bladder    Lung cancer ([place]) 01/25/2012   Microhematuria    OSA (obstructive sleep apnea) 08/29/2015   CPAP SET ON 10   PAD (peripheral artery disease) ([place]) ABI'S  JAN 2014  0.35 ON RIGHT ;  1.04 ON LEFT   Peripheral vascular disease (HCC) S/P ANGIOPLASTY AND STENTING   FOLLOWED  BY DR [name]   Pleural effusion on right 11/18/2018   Chronic, noted on CXR   Sleep apnea    + cpap    Spinal stenosis Sept. 2015   Status post placement of implantable loop recorder    Stroke [facility]) Jul 07, 2013   mini  TIA   Thoracic aorta atherosclerosis (HCC)    Thyrotoxicosis    amiodarone induced   Urgency of urination     Past Surgical History:  Procedure Laterality Date   ANGIO PLASTY     X 4 in legs   AORTOGRAM  07-27-2002   MILD DIFFUSE ILIAC ARTERY OCCLUSIVE DISEASE /  LEFT RENAL ARTERY 20%/ PATENT LEFT FEM-POP GRAFT/ MILD SFA AND  POPLITEAL ARTERY OCCLUSIVE DISEASE W/ SEVERE KIDNEY OCCLUSIVE DISEASE   ATRIAL FIBRILLATION ABLATION N/A 04/09/2019   Procedure: ATRIAL FIBRILLATION ABLATION;  Surgeon: Thompson Grayer, MD;  Location: [place] CV LAB;  Service: Cardiovascular;  Laterality: N/A;   ATRIAL FIBRILLATION ABLATION N/A 02/12/2020   Procedure: ATRIAL FIBRILLATION ABLATION;  Surgeon: Thompson Grayer, MD;  Location: [place] CV LAB;  Service: Cardiovascular;  Laterality: N/A;   BASAL CELL CARCINOMA EXCISION     MULTIPLE TIMES--  RIGHT FOREARM, CHEEKS, AND BACK   BIOPSY  06/25/2018   Procedure: BIOPSY;  Surgeon: Irving Copas., MD;  Location: [place];  Service: Gastroenterology;;   CARDIOVASCULAR STRESS TEST  12-08-2012  DR [facility]   NORMAL LEXISCAN WITH NO EXERCISE NUCLEAR STUDY/ EF 66%/   NO ISCHEMIA/ NO SIGNIFICANT CHANGE FROM PRIOR STUDY   CARDIOVERSION N/A 02/14/2018   Procedure: CARDIOVERSION;  Surgeon: Larey Dresser, MD;  Location: [place];  Service: Cardiovascular;  Laterality: N/A;   CAROTID ANGIOGRAM N/A 07/10/2013   Procedure: CAROTID ANGIOGRAM;  Surgeon: Elam Dutch, MD;  Location: [facility] CATH LAB;  Service: Cardiovascular;  Laterality: N/A;   CAROTID ENDARTERECTOMY Right 07-14-13   cea   CATARACT EXTRACTION W/ INTRAOCULAR LENS  IMPLANT, BILATERAL     colon polyectomy     COLONOSCOPY     COLONOSCOPY WITH PROPOFOL N/A 06/25/2018   Procedure: COLONOSCOPY WITH PROPOFOL;  Surgeon: Rush Landmark Telford Nab., MD;  Location: [place];  Service: Gastroenterology;  Laterality: N/A;   CYSTOSCOPY W/ RETROGRADES Bilateral 01/21/2013   Procedure: CYSTOSCOPY WITH RETROGRADE PYELOGRAM;  Surgeon: Molli Hazard, MD;  Location: [facility];  Service: Urology;  Laterality: Bilateral;   CYSTO, BLADDER BIOPSY, BILATERAL RETROGRADE PYELOGRAM  RAD TECH FROM RADIOLOGY PER JOY   CYSTOSCOPY WITH BIOPSY N/A 01/21/2013   Procedure: CYSTOSCOPY WITH BIOPSY;  Surgeon: Molli Hazard, MD;   Location: [facility];  Service: Urology;  Laterality: N/A;   ENDARTERECTOMY Right 07/14/2013   Procedure: ENDARTERECTOMY CAROTID;  Surgeon: Angelia Mould, MD;  Location: [place];  Service: Vascular;  Laterality: Right;   EP IMPLANTABLE DEVICE N/A 08/12/2015   Procedure: Loop Recorder Insertion;  Surgeon: Thompson Grayer, MD;  Location: [name] CV LAB;  Service: Cardiovascular;  Laterality: N/A;   ESOPHAGOGASTRODUODENOSCOPY (EGD) WITH PROPOFOL N/A 06/25/2018   Procedure: ESOPHAGOGASTRODUODENOSCOPY (EGD) WITH PROPOFOL;  Surgeon: Rush Landmark Telford Nab., MD;  Location: [place];  Service: Gastroenterology;  Laterality: N/A;   ESOPHAGOGASTRODUODENOSCOPY (EGD) WITH PROPOFOL N/A 07/10/2018   Procedure: ESOPHAGOGASTRODUODENOSCOPY (EGD) WITH PROPOFOL;  Surgeon: Milus Banister, MD;  Location: [facility];  Service: Endoscopy;  Laterality: N/A;   EUS  N/A 07/10/2018   Procedure: UPPER ENDOSCOPIC ULTRASOUND (EUS) RADIAL;  Surgeon: Milus Banister, MD;  Location: [facility];  Service: Endoscopy;  Laterality: N/A;   EYE SURGERY Right    FEMORAL-POPLITEAL BYPASS GRAFT Left [place]   AND 2001  IN [place]   FEMORAL-POPLITEAL BYPASS GRAFT Left 08/30/2015   Procedure: REVISION OF BYPASS GRAFT Left  FEMORAL-POPLITEAL ARTERY;  Surgeon: Angelia Mould, MD;  Location: [place];  Service: Vascular;  Laterality: Left;   FINE NEEDLE ASPIRATION N/A 07/10/2018   Procedure: FINE NEEDLE ASPIRATION (FNA) LINEAR;  Surgeon: Milus Banister, MD;  Location: [facility];  Service: Endoscopy;  Laterality: N/A;   FLEXIBLE SIGMOIDOSCOPY N/A 12/12/2018   Procedure: FLEXIBLE SIGMOIDOSCOPY;  Surgeon: Ileana Roup, MD;  Location: [facility];  Service: General;  Laterality: N/A;   LARYNGOSCOPY  06-27-2004   BX VOCAL CORD  (LEUKOPLAKIA)  PER PT NO ISSUES SINCE   LEFT HEART CATH AND CORONARY ANGIOGRAPHY N/A 08/20/2017   Procedure: LEFT HEART CATH AND CORONARY ANGIOGRAPHY;  Surgeon:  Larey Dresser, MD;  Location: [place] CV LAB;  Service: Cardiovascular;  Laterality: N/A;   LOOP RECORDER INSERTION N/A 04/09/2019   Procedure: LOOP RECORDER INSERTION;  Surgeon: Thompson Grayer, MD;  Location: [place] CV LAB;  Service: Cardiovascular;  Laterality: N/A;   LOOP RECORDER REMOVAL N/A 04/09/2019   Procedure: LOOP RECORDER REMOVAL;  Surgeon: Thompson Grayer, MD;  Location: [place] CV LAB;  Service: Cardiovascular;  Laterality: N/A;   LOWER EXTREMITY ANGIOGRAM Bilateral 08/29/2015   Procedure: Lower Extremity Angiogram;  Surgeon: Angelia Mould, MD;  Location: [place] CV LAB;  Service: Cardiovascular;  Laterality: Bilateral;   LUNG LOBECTOMY  01/24/2011    RIGHT UPPER LOBE  (SQUAMOUS CELL CARCINOMA) Dr Dorthea [place] , [facility]. No chemotherapyor radiation   PATCH ANGIOPLASTY Right 07/14/2013   Procedure: PATCH ANGIOPLASTY;  Surgeon: Angelia Mould, MD;  Location: [place];  Service: Vascular;  Laterality: Right;   PATCH ANGIOPLASTY Left 08/30/2015   Procedure: VEIN PATCH ANGIOPLASTY OF PROXIMAL Left BYPASS GRAFT;  Surgeon: Angelia Mould, MD;  Location: [place];  Service: Vascular;  Laterality: Left;   PERIPHERAL VASCULAR CATHETERIZATION N/A 08/29/2015   Procedure: Abdominal Aortogram;  Surgeon: Angelia Mould, MD;  Location: [place] CV LAB;  Service: Cardiovascular;  Laterality: N/A;   POLYPECTOMY  06/25/2018   Procedure: POLYPECTOMY;  Surgeon: Rush Landmark Telford Nab., MD;  Location: [place];  Service: Gastroenterology;;   POLYPECTOMY     SUBMUCOSAL INJECTION  06/25/2018   Procedure: SUBMUCOSAL INJECTION;  Surgeon: Irving Copas., MD;  Location: [place];  Service: Gastroenterology;;   TEE WITHOUT CARDIOVERSION N/A 02/14/2018   Procedure: TRANSESOPHAGEAL ECHOCARDIOGRAM (TEE);  Surgeon: Larey Dresser, MD;  Location: [facility] ENDOSCOPY;  Service: Cardiovascular;  Laterality: N/A;   trabecular surgery     OS   TRANSTHORACIC ECHOCARDIOGRAM   12-29-2012  DR [facility]   MILD LVH/  LVSF NORMAL/ EF 45-40%/  GRADE I DIASTOLIC DYSFUNCTION    Current Outpatient Medications  Medication Sig Dispense Refill   acetaminophen (TYLENOL) 325 MG tablet Take 650 mg by mouth every 6 (six) hours as needed for moderate pain or headache.     albuterol (VENTOLIN HFA) 108 (90 Base) MCG/ACT inhaler Inhale 2 puffs into the lungs every 6 (six) hours as needed for wheezing or shortness of breath. 8.5 g 0   amLODipine (NORVASC) 5 MG tablet TAKE ONE TABLET ONCE DAILY, 60 tablet 2   Brinzolamide-Brimonidine [facility])  1-0.2 % SUSP Place 1 drop into both eyes 2 times daily.     Budeson-Glycopyrrol-Formoterol (BREZTRI AEROSPHERE) 160-9-4.8 MCG/ACT AERO Inhale 2 puffs into the lungs in the morning and at bedtime. 10.7 g 0   docusate sodium (COLACE) 100 MG capsule Take 100 mg by mouth 2 (two) times daily.      ELIQUIS 5 MG TABS tablet TAKE ONE TABLET BY MOUTH TWICE DAILY. 120 tablet 3   empagliflozin (JARDIANCE) 10 MG TABS tablet Take 1 tablet (10 mg total) by mouth daily before breakfast. 30 tablet 11   escitalopram (LEXAPRO) 10 MG tablet TAKE ONE TABLET BY MOUTH EVERY DAY 28 tablet 5   furosemide (LASIX) 20 MG tablet Take 3 tablets (60 mg total) by mouth 2 (two) times daily. 180 tablet 11   latanoprost (XALATAN) 0.005 % ophthalmic solution Place 1 drop into both eyes at bedtime.     losartan (COZAAR) 25 MG tablet Take 1 tablet (25 mg total) by mouth daily. 90 tablet 3   Melatonin 10 MG CAPS Take 10 mg by mouth at bedtime.     metoprolol succinate (TOPROL-XL) 25 MG 24 hr tablet TAKE 1/2 TABLET IN THE EVENING WITH OR IMMEDIATELY FOLLOWING A MEAL 30 tablet 3   montelukast (SINGULAIR) 10 MG tablet TAKE ONE TABLET AT BEDTIME 28 tablet 5   Multiple Vitamins-Minerals (CENTRUM SILVER PO) Take 1 tablet by mouth daily.     omeprazole (PRILOSEC) 20 MG capsule TAKE (1) CAPSULE TWICE DAILY. 56 capsule 3   potassium chloride SA (KLOR-CON) 20 MEQ tablet Take 2 tablets (40 mEq  total) by mouth daily. 60 tablet 3   rosuvastatin (CRESTOR) 40 MG tablet TAKE ONE TABLET ONCE DAILY 60 tablet 3   senna (SENOKOT) 8.6 MG tablet Take 1 tablet by mouth 2 (two) times daily.     VASCEPA 1 g capsule Take 2 capsules (2 g total) by mouth 2 (two) times daily. 120 capsule 11   benzonatate (TESSALON) 200 MG capsule Take 1 capsule (200 mg total) by mouth 3 (three) times daily as needed for cough. (Patient not taking: Reported on 10/10/2021) 20 capsule 0   No current facility-administered medications for this visit.    Allergies:   Niacin-lovastatin er, Penicillins, Sulfonamide derivatives, and Atorvastatin   Social History:  The patient  reports that he quit smoking about 7 years ago. His smoking use included cigarettes. He has a 25.00 pack-year smoking history. He has never used smokeless tobacco. He reports current alcohol use of about 4.0 standard drinks per week. He reports that he does not use drugs.   Family History:  The patient's family history includes Alcohol abuse in his father; Cancer (age of onset: 31) in his daughter; Colon cancer (age of onset: 25) in his daughter; Heart attack in his father; Heart disease in his father, paternal aunt, and paternal uncle; Hypertension in his father and paternal aunt; Stroke in his father, mother, paternal aunt, and paternal uncle.  ROS:  Please see the history of present illness.    All other systems are reviewed and otherwise negative.   PHYSICAL EXAM:  VS:  BP (!) 142/70    Pulse 71    Ht 6\' 1"  (1.854 m)    Wt 198 lb 9.6 oz (90.1 kg)    SpO2 95%    BMI 26.20 kg/m  BMI: Body mass index is 26.2 kg/m. Well nourished, well developed, in no acute distress HEENT: normocephalic, atraumatic Neck: no JVD, carotid bruits or masses Cardiac:  RRR; no significant murmurs, no rubs, or gallops Lungs:  CTA b/l, no wheezing, rhonchi or rales Abd: soft, nontender MS: no deformity or atrophy Ext: no edema Skin: warm and dry, no rash Neuro:  No  gross deficits appreciated Psych: euthymic mood, full affect  ILR site is stable, no tethering or discomfort   EKG:  Done today and reviewed by myself shows  SR 63bpm, 1st degree AVblock 244ms, RBBB  Device interrogation done today and reviewed by myself:  Battery is good R waves 0.07 AFib burden 0.1% EGM today is somewhat irregular, low amplitude Pwaves intermittently noted. EKG today confirms SR   07/02/2021: TTE IMPRESSIONS   1. Limited study.   2. Left ventricular ejection fraction, by estimation, is 55 to 60%. The  left ventricle has normal function. Left ventricular endocardial border  not optimally defined to evaluate regional wall motion. There is moderate  left ventricular hypertrophy.   3. Right ventricular systolic function is mildly reduced. The right  ventricular size is normal. Tricuspid regurgitation signal is inadequate  for assessing PA pressure.   4. The mitral valve is abnormal. Trivial mitral valve regurgitation.  Moderate mitral annular calcification.   5. Tricuspid valve regurgitation is mild to moderate.   6. The aortic valve was not well visualized. There is moderate  calcification of the aortic valve.   7. The inferior vena cava is dilated in size with >50% respiratory  variability, suggesting right atrial pressure of 8 mmHg.   Comparison(s): Prior images reviewed side by side. LVEF remains normal  range, unable to accurately evaluate regional wall motion.   03/29/2020: stress myoview Nuclear stress EF: 70%. There was no ST segment deviation noted during stress. The study is normal. This is a low risk study. The left ventricular ejection fraction is hyperdynamic (>65%).    02/12/2020: EPS/ablation CONCLUSIONS: 1. Sinus rhythm upon presentation.   2. Intracardiac echo reveals a moderate sized left atrium with four separate pulmonary veins without evidence of pulmonary vein stenosis. 3. Pulmonary veins were quiescent from a prior ablation  procedure.  Entrance and exit block were confirmed from a prior ablation.  Adenosine $RemoveBe'12mg'[id number]$  IV x 2 was administered with no return of conduction.  By voltage, there was very small area posteriorly near the left lower pulmonary vein which did require additional ablation today. 4. Isuprel was administered at 10 mcg/min with adequate acceleration in heart rate.  No arrhythmias were induced with atrial pacing.  5. Additional left atrial ablation was performed with a standard box lesion created along the posterior wall of the left atrium 6. No early apparent complications.  04/09/2019: EPS/ablation, loop removal and new implant CONCLUSIONS: 1. Sinus rhythm upon presentation.   2. Intracardiac echo reveals a moderate sized left atrium with four separate pulmonary veins without evidence of pulmonary vein stenosis. 3. Successful electrical isolation and anatomical encircling of all four pulmonary veins with radiofrequency current.    4. Isthmus dependant right atrial flutter induced during ablation and successfully ablated along the Cavo-tricuspid isthmus with complete bidirectional isthmus block acheived 5. No inducible arrhythmias following ablation  6. Successful ILR removal due to RRT battery status with a new Medtronic Reveal LINQ implanted for afib management post ablation 7. No early apparent complications.  08/20/2017: LHC  No obstructive CAD.  Mild luminal irregularities only.   Recent Labs: 06/08/2021: TSH 2.19 07/02/2021: Magnesium 2.9 07/11/2021: ALT 47; Hemoglobin 13.4; Platelets 436.0 08/21/2021: B Natriuretic Peptide 175.6 08/29/2021: BUN 13; Creatinine, Ser 1.32; Potassium 3.6; Sodium 136  12/07/2020: LDL Cholesterol 31 07/11/2021: Cholesterol 102; Direct LDL 41.0; HDL 32.80; Total CHOL/HDL Ratio 3; Triglycerides 351.0; VLDL 70.2   CrCl cannot be calculated (Patient's most recent lab result is older than the maximum 21 days allowed.).   Wt Readings from Last 3 Encounters:  10/10/21 198  lb 9.6 oz (90.1 kg)  08/21/21 195 lb (88.5 kg)  07/27/21 194 lb (88 kg)     Other studies reviewed: Additional studies/records reviewed today include: summarized above  ASSESSMENT AND PLAN:  Paroxysmal Afib CHA2DS2Vasc is 4, on Eliquis, appropriately dosed 0.1 % burden  Chronic CHF (diastolic) Follows with [name] Weight is up some SOB waxes/wanes chronically His exam does not support volume OL He reports daily weights (mostly) and weight generally stable, up a few down a couple   HTN Could be better, but mentions he has had an "interesting morning today" No changes    Disposition: F/u with monthly remotes as usual, in clnic with EP, with retirement/semi retirement of [name], he is interested in getting associated with another EP, will plan to see Dr. Quentin Ore    Current medicines are reviewed at length with the patient today.  The patient did not have any concerns regarding medicines.  Troy Night, PA-C 10/10/2021 11:41 AM     [facility] [address] [place] [address] [phone] (office)  [phone] (fax)

## 2021-10-10 ENCOUNTER — Other Ambulatory Visit: Payer: Self-pay

## 2021-10-10 ENCOUNTER — Ambulatory Visit: Payer: PPO | Admitting: Physician Assistant

## 2021-10-10 ENCOUNTER — Encounter: Payer: Self-pay | Admitting: Physician Assistant

## 2021-10-10 VITALS — BP 142/70 | HR 71 | Ht 73.0 in | Wt 198.6 lb

## 2021-10-10 DIAGNOSIS — I5032 Chronic diastolic (congestive) heart failure: Secondary | ICD-10-CM

## 2021-10-10 DIAGNOSIS — I10 Essential (primary) hypertension: Secondary | ICD-10-CM

## 2021-10-10 DIAGNOSIS — I480 Paroxysmal atrial fibrillation: Secondary | ICD-10-CM

## 2021-10-10 LAB — CUP PACEART INCLINIC DEVICE CHECK
Date Time Interrogation Session: 20230110124853
Implantable Pulse Generator Implant Date: 20200709

## 2021-10-10 MED ORDER — APIXABAN 5 MG PO TABS: 5.0000 mg | ORAL_TABLET | Freq: Two times a day (BID) | ORAL | 6 refills | Status: DC

## 2021-10-10 NOTE — Patient Instructions (Signed)
Medication Instructions:   Your physician recommends that you continue on your current medications as directed. Please refer to the Current Medication list given to you today.  *If you need a refill on your cardiac medications before your next appointment, please call your pharmacy*   Lab Work:  [place]   If you have labs (blood work) drawn today and your tests are completely normal, you will receive your results only by: [place] (if you have MyChart) OR A paper copy in the mail If you have any lab test that is abnormal or we need to change your treatment, we will call you to review the results.   Testing/Procedures: NONE ORDERED  TODAY     Follow-Up: At [facility], you and your health needs are our priority.  As part of our continuing mission to provide you with exceptional heart care, we have created designated Provider Care Teams.  These Care Teams include your primary Cardiologist (physician) and Advanced Practice Providers (APPs -  Physician Assistants and Nurse Practitioners) who all work together to provide you with the care you need, when you need it.  We recommend signing up for the patient portal called "MyChart".  Sign up information is provided on this After Visit Summary.  MyChart is used to connect with patients for Virtual Visits (Telemedicine).  Patients are able to view lab/test results, encounter notes, upcoming appointments, etc.  Non-urgent messages can be sent to your provider as well.   To learn more about what you can do with MyChart, go to [email or web address].    Your next appointment:   1 year(s)  The format for your next appointment:   In Person  Provider:   Lars Mage, MD    Other Instructions

## 2021-10-11 DIAGNOSIS — I509 Heart failure, unspecified: Secondary | ICD-10-CM | POA: Diagnosis not present

## 2021-10-11 DIAGNOSIS — D692 Other nonthrombocytopenic purpura: Secondary | ICD-10-CM | POA: Diagnosis not present

## 2021-10-11 DIAGNOSIS — Z902 Acquired absence of lung [part of]: Secondary | ICD-10-CM | POA: Diagnosis not present

## 2021-10-11 DIAGNOSIS — I480 Paroxysmal atrial fibrillation: Secondary | ICD-10-CM | POA: Diagnosis not present

## 2021-10-11 DIAGNOSIS — Z7901 Long term (current) use of anticoagulants: Secondary | ICD-10-CM | POA: Diagnosis not present

## 2021-10-11 DIAGNOSIS — Z7951 Long term (current) use of inhaled steroids: Secondary | ICD-10-CM | POA: Diagnosis not present

## 2021-10-11 DIAGNOSIS — J439 Emphysema, unspecified: Secondary | ICD-10-CM | POA: Diagnosis not present

## 2021-10-12 ENCOUNTER — Telehealth: Payer: PPO

## 2021-10-12 NOTE — Addendum Note (Signed)
Addended by: Claude Manges on: 10/12/2021 02:59 PM   Modules accepted: Orders

## 2021-10-13 ENCOUNTER — Encounter: Payer: Self-pay | Admitting: Pulmonary Disease

## 2021-10-13 ENCOUNTER — Ambulatory Visit: Payer: PPO | Admitting: Pulmonary Disease

## 2021-10-13 ENCOUNTER — Other Ambulatory Visit: Payer: Self-pay

## 2021-10-13 VITALS — BP 142/66 | HR 73 | Temp 97.6°F | Ht 73.0 in | Wt 203.0 lb

## 2021-10-13 DIAGNOSIS — J449 Chronic obstructive pulmonary disease, unspecified: Secondary | ICD-10-CM

## 2021-10-13 DIAGNOSIS — Z85038 Personal history of other malignant neoplasm of large intestine: Secondary | ICD-10-CM | POA: Diagnosis not present

## 2021-10-13 DIAGNOSIS — R599 Enlarged lymph nodes, unspecified: Secondary | ICD-10-CM

## 2021-10-13 DIAGNOSIS — J90 Pleural effusion, not elsewhere classified: Secondary | ICD-10-CM | POA: Diagnosis not present

## 2021-10-13 DIAGNOSIS — R911 Solitary pulmonary nodule: Secondary | ICD-10-CM

## 2021-10-13 DIAGNOSIS — Z85118 Personal history of other malignant neoplasm of bronchus and lung: Secondary | ICD-10-CM | POA: Diagnosis not present

## 2021-10-13 NOTE — Patient Instructions (Signed)
Thank you for visiting Dr. Valeta Harms at [facility]. Today we recommend the following:  Orders Placed This Encounter  Procedures   CT Super D Chest Wo Contrast    Return in about 6 months (around 04/12/2022) for with APP or Dr. Valeta Harms.    Please do your part to reduce the spread of COVID-19.

## 2021-10-13 NOTE — Progress Notes (Signed)
Synopsis: Referred in June 2022 for abnormal CT chest, PCP: By Binnie Rail, MD  Subjective:   PATIENT ID: Troy Kindred [name] GENDER: male DOB: 17-Feb-1941, MRN: [id number]  Chief Complaint  Patient presents with   Follow-up    Patient says he's had a lot of shortness of breath the past few weeks    Is an 81 year old gentleman, past medical history of atrial fibrillation, AAA, BPH, chronic diastolic heart failure, emphysema, history of lung cancer, April 2012, squamous cell carcinoma status post right lower lobectomy at [facility], no chemo no radiation no recurrence, hyperlipidemia, hypertension.  Patient has no respiratory complaints today.  He is anxious about this slow-growing lesion that has been followed for some time.  He has several within the right chest, none within the left chest.  Nuclear medicine pet imaging on 02/08/2021 completed revealing a bandlike hypermetabolic nodules within the inferior aspect of the right upper lobe that have slowly increased over time this nodularity area concerning for malignancy as well as a hypermetabolic nodule just inferior to the SVC within the right upper mediastinum concerning for malignancy as well as a small nodule 13 mm in size with low-level metabolic uptake SUV of 1.4 less concerning for malignancy.  However due to his history of lung cancer status post lobectomy and colon cancer they have been following these areas closely and medical oncology.  OV 05/25/2021: Here today for follow-up regarding lung nodules.  Has no respiratory complaints today.  Had CT scan in June.  Here today for follow-up.  Lung nodule appears stable in comparison to his previous imaging.  OV 07/27/2021: Here today for follow-up after recent hospitalization.  Patient brought into the hospital found to have pleural effusion.  Had thoracentesis completed at that time which was negative for malignant cells did have lymphocyte predominance.  From respiratory standpoint he is doing  okay.  Had repeat CT imaging at the time also showed dissipation of the nodules that we were following in the upper lobes.  He was also discharged from the hospital on multiple inhalers and would like clarification which one he wants to be on.  OV 10/13/2021: Here today for follow-up after recent noncontrasted CT imaging of the chest for lung nodules.  Patient has a right-sided lower lobe area that looks stable.  Reviewed CT results today in the office.  Persistent right-sided effusion appears stable.   Past Medical History:  Diagnosis Date   AAA (abdominal aortic aneurysm) LAST ABDOMINAL US 10-20-17 3.3 CM   MONITORED BY DR [name]   Allergy    Anxiety    Arthritis    Atrial fibrillation (HCC)    Basal cell carcinoma    Bilateral carotid artery stenosis DUPLEX 12-29-2012  BY DR [facility]   BILATERAL ICA STENOSIS 60-79%   BPH (benign prostatic hypertrophy)    Cataract    removed both eyes   CHF (congestive heart failure) (HCC)    Chronic diastolic heart failure (HCC)    Chronic kidney disease    stage 3, pt unaware   Colon cancer (HCC)    Complication of anesthesia    had nausea after carotid artery surgery, states the medicine given to help the nausea, made it worse   Constipation    COPD (chronic obstructive pulmonary disease) (HCC)    Depression    Emphysema of lung (HCC)    GERD (gastroesophageal reflux disease)    Glaucoma BOTH EYES   Dr Gershon Crane   History of basal cell carcinoma excision  History of lung cancer APRIL 2012  SQUAMOUS CELL---- S/P RIGHT LOWER LOBECTOMY AT [facility] --  NO CHEMORADIATION---  NO RECURRENCE    ONCOLOGIST- DR Tressie Stalker  LOV IN [facility] 10-27-2012   History of pneumothorax    pt unaware   Hx of adenomatous colonic polyps 2005    X 2; 1 hyperplastic polyp; Dr Olevia Perches   Hyperlipidemia    Hypertension    Impaired fasting glucose 2007   108; A1c5.4%   Lesion of bladder    Lung cancer ([place]) 01/25/2012   Microhematuria    OSA (obstructive sleep apnea)  08/29/2015   CPAP SET ON 10   PAD (peripheral artery disease) ([place]) ABI'S  JAN 2014  0.65 ON RIGHT ;  1.04 ON LEFT   Peripheral vascular disease ([place]) S/P ANGIOPLASTY AND STENTING   FOLLOWED  BY DR [name]   Pleural effusion on right 11/18/2018   Chronic, noted on CXR   Sleep apnea    + cpap    Spinal stenosis Sept. 2015   Status post placement of implantable loop recorder    Stroke ([place]) Jul 07, 2013   mini  TIA   Thoracic aorta atherosclerosis (HCC)    Thyrotoxicosis    amiodarone induced   Urgency of urination      Family History  Problem Relation Age of Onset   Stroke Mother        mini strokes   Alcohol abuse Father    Heart disease Father        MI after 59   Stroke Father    Hypertension Father    Heart attack Father    Heart disease Paternal Aunt        several   Hypertension Paternal Aunt        several   Stroke Paternal Aunt        several   Stroke Paternal Uncle        several   Heart disease Paternal Uncle        several;2 had MI pre 15   Cancer Daughter 9       breast ca, also with benign sessile polyp    Colon cancer Daughter 64   Colon polyps Neg Hx    Esophageal cancer Neg Hx    Rectal cancer Neg Hx    Stomach cancer Neg Hx      Past Surgical History:  Procedure Laterality Date   ANGIO PLASTY     X 4 in legs   AORTOGRAM  07-27-2002   MILD DIFFUSE ILIAC ARTERY OCCLUSIVE DISEASE /  LEFT RENAL ARTERY 20%/ PATENT LEFT FEM-POP GRAFT/ MILD SFA AND POPLITEAL ARTERY OCCLUSIVE DISEASE W/ SEVERE KIDNEY OCCLUSIVE DISEASE   ATRIAL FIBRILLATION ABLATION N/A 04/09/2019   Procedure: ATRIAL FIBRILLATION ABLATION;  Surgeon: Thompson Grayer, MD;  Location: [place] [facility];  Service: Cardiovascular;  Laterality: N/A;   ATRIAL FIBRILLATION ABLATION N/A 02/12/2020   Procedure: ATRIAL FIBRILLATION ABLATION;  Surgeon: Thompson Grayer, MD;  Location: [place] [facility];  Service: Cardiovascular;  Laterality: N/A;   BASAL CELL CARCINOMA EXCISION     MULTIPLE TIMES--   RIGHT FOREARM, CHEEKS, AND BACK   BIOPSY  06/25/2018   Procedure: BIOPSY;  Surgeon: Irving Copas., MD;  Location: [place];  Service: Gastroenterology;;   CARDIOVASCULAR STRESS TEST  12-08-2012  DR MCLEAN   NORMAL LEXISCAN WITH NO EXERCISE NUCLEAR STUDY/ EF 66%/   NO ISCHEMIA/ NO SIGNIFICANT CHANGE FROM PRIOR STUDY   CARDIOVERSION N/A 02/14/2018  Procedure: CARDIOVERSION;  Surgeon: Larey Dresser, MD;  Location: [facility] ENDOSCOPY;  Service: Cardiovascular;  Laterality: N/A;   CAROTID ANGIOGRAM N/A 07/10/2013   Procedure: CAROTID ANGIOGRAM;  Surgeon: Elam Dutch, MD;  Location: [facility] CATH LAB;  Service: Cardiovascular;  Laterality: N/A;   CAROTID ENDARTERECTOMY Right 07-14-13   cea   CATARACT EXTRACTION W/ INTRAOCULAR LENS  IMPLANT, BILATERAL     colon polyectomy     COLONOSCOPY     COLONOSCOPY WITH PROPOFOL N/A 06/25/2018   Procedure: COLONOSCOPY WITH PROPOFOL;  Surgeon: Rush Landmark Telford Nab., MD;  Location: [place];  Service: Gastroenterology;  Laterality: N/A;   CYSTOSCOPY W/ RETROGRADES Bilateral 01/21/2013   Procedure: CYSTOSCOPY WITH RETROGRADE PYELOGRAM;  Surgeon: Molli Hazard, MD;  Location[facility];  Service: Urology;  Laterality: Bilateral;   CYSTO, BLADDER BIOPSY, BILATERAL RETROGRADE PYELOGRAM  RAD TECH FROM RADIOLOGY PER JOY   CYSTOSCOPY WITH BIOPSY N/A 01/21/2013   Procedure: CYSTOSCOPY WITH BIOPSY;  Surgeon: Molli Hazard, MD;  Location: [facility];  Service: Urology;  Laterality: N/A;   ENDARTERECTOMY Right 07/14/2013   Procedure: ENDARTERECTOMY CAROTID;  Surgeon: Angelia Mould, MD;  Location: [place];  Service: Vascular;  Laterality: Right;   EP IMPLANTABLE DEVICE N/A 08/12/2015   Procedure: Loop Recorder Insertion;  Surgeon: Thompson Grayer, MD;  Location: [place] CV LAB;  Service: Cardiovascular;  Laterality: N/A;   ESOPHAGOGASTRODUODENOSCOPY (EGD) WITH PROPOFOL N/A 06/25/2018   Procedure:  ESOPHAGOGASTRODUODENOSCOPY (EGD) WITH PROPOFOL;  Surgeon: Rush Landmark Telford Nab., MD;  Location: [place];  Service: Gastroenterology;  Laterality: N/A;   ESOPHAGOGASTRODUODENOSCOPY (EGD) WITH PROPOFOL N/A 07/10/2018   Procedure: ESOPHAGOGASTRODUODENOSCOPY (EGD) WITH PROPOFOL;  Surgeon: Milus Banister, MD;  Location: [facility] ENDOSCOPY;  Service: Endoscopy;  Laterality: N/A;   EUS N/A 07/10/2018   Procedure: UPPER ENDOSCOPIC ULTRASOUND (EUS) RADIAL;  Surgeon: Milus Banister, MD;  Location: [facility] ENDOSCOPY;  Service: Endoscopy;  Laterality: N/A;   EYE SURGERY Right    FEMORAL-POPLITEAL BYPASS GRAFT Left [place]   AND 2001  IN [place]   FEMORAL-POPLITEAL BYPASS GRAFT Left 08/30/2015   Procedure: REVISION OF BYPASS GRAFT Left  FEMORAL-POPLITEAL ARTERY;  Surgeon: Angelia Mould, MD;  Location: [place];  Service: Vascular;  Laterality: Left;   FINE NEEDLE ASPIRATION N/A 07/10/2018   Procedure: FINE NEEDLE ASPIRATION (FNA) LINEAR;  Surgeon: Milus Banister, MD;  Location: [facility] ENDOSCOPY;  Service: Endoscopy;  Laterality: N/A;   FLEXIBLE SIGMOIDOSCOPY N/A 12/12/2018   Procedure: FLEXIBLE SIGMOIDOSCOPY;  Surgeon: Ileana Roup, MD;  Location: [facility] ORS;  Service: General;  Laterality: N/A;   LARYNGOSCOPY  06-27-2004   BX VOCAL CORD  (LEUKOPLAKIA)  PER PT NO ISSUES SINCE   LEFT HEART CATH AND CORONARY ANGIOGRAPHY N/A 08/20/2017   Procedure: LEFT HEART CATH AND CORONARY ANGIOGRAPHY;  Surgeon: Larey Dresser, MD;  Location: [place] [facility];  Service: Cardiovascular;  Laterality: N/A;   LOOP RECORDER INSERTION N/A 04/09/2019   Procedure: LOOP RECORDER INSERTION;  Surgeon: Thompson Grayer, MD;  Location: [place] [facility];  Service: Cardiovascular;  Laterality: N/A;   LOOP RECORDER REMOVAL N/A 04/09/2019   Procedure: LOOP RECORDER REMOVAL;  Surgeon: Thompson Grayer, MD;  Location: [place] [facility];  Service: Cardiovascular;  Laterality: N/A;   LOWER EXTREMITY ANGIOGRAM Bilateral 08/29/2015    Procedure: Lower Extremity Angiogram;  Surgeon: Angelia Mould, MD;  Location: [place] [facility];  Service: Cardiovascular;  Laterality: Bilateral;   LUNG LOBECTOMY  01/24/2011    RIGHT UPPER LOBE  (SQUAMOUS CELL CARCINOMA) Dr Dorthea Cove , [facility]. No chemotherapyor radiation   PATCH ANGIOPLASTY Right 07/14/2013   Procedure: PATCH ANGIOPLASTY;  Surgeon: Angelia Mould, MD;  Location: [place];  Service: Vascular;  Laterality: Right;   PATCH ANGIOPLASTY Left 08/30/2015   Procedure: VEIN PATCH ANGIOPLASTY OF PROXIMAL Left BYPASS GRAFT;  Surgeon: Angelia Mould, MD;  Location: [place];  Service: Vascular;  Laterality: Left;   PERIPHERAL VASCULAR CATHETERIZATION N/A 08/29/2015   Procedure: Abdominal Aortogram;  Surgeon: Angelia Mould, MD;  Location: [place] [facility];  Service: Cardiovascular;  Laterality: N/A;   POLYPECTOMY  06/25/2018   Procedure: POLYPECTOMY;  Surgeon: Rush Landmark Telford Nab., MD;  Location: [place];  Service: Gastroenterology;;   POLYPECTOMY     SUBMUCOSAL INJECTION  06/25/2018   Procedure: SUBMUCOSAL INJECTION;  Surgeon: Irving Copas., MD;  Location: [place];  Service: Gastroenterology;;   TEE WITHOUT CARDIOVERSION N/A 02/14/2018   Procedure: TRANSESOPHAGEAL ECHOCARDIOGRAM (TEE);  Surgeon: Larey Dresser, MD;  Location: [facility] ENDOSCOPY;  Service: Cardiovascular;  Laterality: N/A;   trabecular surgery     OS   TRANSTHORACIC ECHOCARDIOGRAM  12-29-2012  DR [facility]   MILD LVH/  LVSF NORMAL/ EF 65-68%/  GRADE I DIASTOLIC DYSFUNCTION    Social History   Socioeconomic History   Marital status: Married    Spouse name: [name]    Number of children: 3   Years of education: 12+   Highest education level: Not on file  Occupational History   Occupation: Retired    Comment: Owns a [employer], Advertising account executive, 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  Tobacco Use   Smoking status: Former     Packs/day: 0.50    Years: 50.00    Pack years: 25.00    Types: Cigarettes    Quit date: 07/28/2014    Years since quitting: 7.2   Smokeless tobacco: Never  Vaping Use   Vaping Use: Never used  Substance and Sexual Activity   Alcohol use: Yes    Alcohol/week: 4.0 standard drinks    Types: 1 Glasses of wine, 1 Cans of beer, 1 Shots of liquor, 1 Standard drinks or equivalent per week    Comment:  socially, variable   Drug use: No   Sexual activity: Not on file  Other Topics Concern   Not on file  Social History Narrative   Patient lives at home with spouse [name]   Patient has 3 children    Patient is right handed    Social Determinants of Health   Financial Resource Strain: Low Risk    Difficulty of Paying Living Expenses: Not hard at all  Food Insecurity: No Food Insecurity   Worried About Charity fundraiser in the Last Year: Never true   Arboriculturist in the Last Year: Never true  Transportation Needs: No Transportation Needs   Lack of Transportation (Medical): No   Lack of Transportation (Non-Medical): No  Physical Activity: Sufficiently Active   Days of Exercise per Week: 3 days   Minutes of Exercise per Session: 60 min  Stress: No Stress Concern Present   Feeling of Stress : Not at all  Social Connections: Socially Isolated   Frequency of Communication with Friends and Family: More than three times a week   Frequency of Social Gatherings with Friends and Family: Once a week   Attends Religious Services: Never  Active Member of Clubs or Organizations: No   Attends Archivist Meetings: Never   Marital Status: Widowed  Human resources officer Violence: Not At Risk   Fear of Current or Ex-Partner: No   Emotionally Abused: No   Physically Abused: No   Sexually Abused: No     Allergies  Allergen Reactions   Niacin-Lovastatin Er Shortness Of Breath and Other (See Comments)    Dyspnea, flushing   Penicillins Hives, Shortness Of Breath and Other (See Comments)     Flushing  & dyspnea Because of a history of documented adverse serious drug reaction;Medi Alert bracelet  is recommended PCN reaction causing immediate rash, facial/tongue/throat swelling, SOB or lightheadedness with hypotension: Yes PCN reaction causing severe rash involving mucus membranes or skin necrosis: No PCN reaction occurring within the last 10 years: NO PCN reaction that required hospitalization: NO   Sulfonamide Derivatives Hives, Shortness Of Breath and Other (See Comments)    Flushing & dyspnea Because of a history of documented adverse serious drug reaction;Medi Alert bracelet  is recommended   Atorvastatin Other (See Comments)    Myalgias & athralgias- takes Crestor, DOES NOT LIKE LIPITOR     Outpatient Medications Prior to Visit  Medication Sig Dispense Refill   acetaminophen (TYLENOL) 325 MG tablet Take 650 mg by mouth every 6 (six) hours as needed for moderate pain or headache.     albuterol (VENTOLIN HFA) 108 (90 Base) MCG/ACT inhaler Inhale 2 puffs into the lungs every 6 (six) hours as needed for wheezing or shortness of breath. 8.5 g 0   amLODipine (NORVASC) 5 MG tablet TAKE ONE TABLET ONCE DAILY, 60 tablet 2   apixaban (ELIQUIS) 5 MG TABS tablet Take 1 tablet (5 mg total) by mouth 2 (two) times daily. 60 tablet 6   Brinzolamide-Brimonidine (SIMBRINZA) 1-0.2 % SUSP Place 1 drop into both eyes 2 times daily.     Budeson-Glycopyrrol-Formoterol (BREZTRI AEROSPHERE) 160-9-4.8 MCG/ACT AERO Inhale 2 puffs into the lungs in the morning and at bedtime. 10.7 g 0   docusate sodium (COLACE) 100 MG capsule Take 100 mg by mouth 2 (two) times daily.      empagliflozin (JARDIANCE) 10 MG TABS tablet Take 1 tablet (10 mg total) by mouth daily before breakfast. 30 tablet 11   escitalopram (LEXAPRO) 10 MG tablet TAKE ONE TABLET BY MOUTH EVERY DAY 28 tablet 5   furosemide (LASIX) 20 MG tablet Take 3 tablets (60 mg total) by mouth 2 (two) times daily. 180 tablet 11   latanoprost  (XALATAN) 0.005 % ophthalmic solution Place 1 drop into both eyes at bedtime.     losartan (COZAAR) 25 MG tablet Take 1 tablet (25 mg total) by mouth daily. 90 tablet 3   Melatonin 10 MG CAPS Take 10 mg by mouth at bedtime.     metoprolol succinate (TOPROL-XL) 25 MG 24 hr tablet TAKE 1/2 TABLET IN THE EVENING WITH OR IMMEDIATELY FOLLOWING A MEAL 30 tablet 3   montelukast (SINGULAIR) 10 MG tablet TAKE ONE TABLET AT BEDTIME 28 tablet 5   Multiple Vitamins-Minerals (CENTRUM SILVER PO) Take 1 tablet by mouth daily.     omeprazole (PRILOSEC) 20 MG capsule TAKE (1) CAPSULE TWICE DAILY. 56 capsule 3   potassium chloride SA (KLOR-CON) 20 MEQ tablet Take 2 tablets (40 mEq total) by mouth daily. 60 tablet 3   rosuvastatin (CRESTOR) 40 MG tablet TAKE ONE TABLET ONCE DAILY 60 tablet 3   senna (SENOKOT) 8.6 MG tablet Take 1 tablet by mouth  2 (two) times daily.     VASCEPA 1 g capsule Take 2 capsules (2 g total) by mouth 2 (two) times daily. 120 capsule 11   benzonatate (TESSALON) 200 MG capsule Take 1 capsule (200 mg total) by mouth 3 (three) times daily as needed for cough. (Patient not taking: Reported on 10/10/2021) 20 capsule 0   No facility-administered medications prior to visit.    Review of Systems  Constitutional:  Negative for chills, fever, malaise/fatigue and weight loss.  HENT:  Negative for hearing loss, sore throat and tinnitus.   Eyes:  Negative for blurred vision and double vision.  Respiratory:  Positive for shortness of breath. Negative for cough, hemoptysis, sputum production, wheezing and stridor.   Cardiovascular:  Negative for chest pain, palpitations, orthopnea, leg swelling and PND.  Gastrointestinal:  Negative for abdominal pain, constipation, diarrhea, heartburn, nausea and vomiting.  Genitourinary:  Negative for dysuria, hematuria and urgency.  Musculoskeletal:  Negative for joint pain and myalgias.  Skin:  Negative for itching and rash.  Neurological:  Negative for dizziness,  tingling, weakness and headaches.  Endo/Heme/Allergies:  Negative for environmental allergies. Does not bruise/bleed easily.  Psychiatric/Behavioral:  Negative for depression. The patient is not nervous/anxious and does not have insomnia.   All other systems reviewed and are negative.   Objective:  Physical Exam Vitals reviewed.  Constitutional:      General: He is not in acute distress.    Appearance: He is well-developed.  HENT:     Head: Normocephalic and atraumatic.  Eyes:     General: No scleral icterus.    Conjunctiva/sclera: Conjunctivae normal.     Pupils: Pupils are equal, round, and reactive to light.  Neck:     Vascular: No JVD.     Trachea: No tracheal deviation.  Cardiovascular:     Rate and Rhythm: Normal rate and regular rhythm.     Heart sounds: Normal heart sounds. No murmur heard. Pulmonary:     Effort: Pulmonary effort is normal. No tachypnea, accessory muscle usage or respiratory distress.     Breath sounds: No stridor. No wheezing, rhonchi or rales.  Abdominal:     General: There is no distension.     Palpations: Abdomen is soft.     Tenderness: There is no abdominal tenderness.  Musculoskeletal:        General: No tenderness.     Cervical back: Neck supple.  Lymphadenopathy:     Cervical: No cervical adenopathy.  Skin:    General: Skin is warm and dry.     Capillary Refill: Capillary refill takes less than 2 seconds.     Findings: No rash.  Neurological:     Mental Status: He is alert and oriented to person, place, and time.  Psychiatric:        Behavior: Behavior normal.     Vitals:   10/13/21 1340  BP: (!) 142/66  Pulse: 73  Temp: 97.6 F (36.4 C)  TempSrc: Oral  SpO2: 95%  Weight: 203 lb (92.1 kg)  Height: 6\' 1"  (1.854 m)   95% on RA BMI Readings from Last 3 Encounters:  10/13/21 26.78 kg/m  10/10/21 26.20 kg/m  08/21/21 25.73 kg/m   Wt Readings from Last 3 Encounters:  10/13/21 203 lb (92.1 kg)  10/10/21 198 lb 9.6 oz  (90.1 kg)  08/21/21 195 lb (88.5 kg)     CBC    Component Value Date/Time   WBC 13.6 (H) 07/11/2021 1455   RBC 4.39 07/11/2021  1455   HGB 13.4 07/11/2021 1455   HGB 14.3 02/09/2021 1136   HGB 12.4 (L) 05/16/2020 1615   HGB 11.8 (L) 03/07/2016 1213   HCT 40.2 07/11/2021 1455   HCT 37.9 05/16/2020 1615   HCT 36.3 (L) 03/07/2016 1213   PLT 436.0 (H) 07/11/2021 1455   PLT 248 02/09/2021 1136   PLT 329 05/16/2020 1615   MCV 91.6 07/11/2021 1455   MCV 94 05/16/2020 1615   MCV 85.0 03/07/2016 1213   MCH 31.5 07/03/2021 0510   MCHC 33.4 07/11/2021 1455   RDW 15.0 07/11/2021 1455   RDW 14.2 05/16/2020 1615   RDW 14.7 (H) 03/07/2016 1213   LYMPHSABS 0.8 07/11/2021 1455   LYMPHSABS 0.9 05/16/2020 1615   LYMPHSABS 0.8 (L) 03/07/2016 1213   MONOABS 0.6 07/11/2021 1455   MONOABS 0.8 03/07/2016 1213   EOSABS 0.1 07/11/2021 1455   EOSABS 0.1 05/16/2020 1615   BASOSABS 0.0 07/11/2021 1455   BASOSABS 0.0 05/16/2020 1615   BASOSABS 0.0 03/07/2016 1213    Chest Imaging: 02/08/2021: Nuclear medicine pet imaging Hypermetabolic bandlike nodules within the right upper lobe along the fissure.  A smaller 13 mm nodule anterior right upper lobe.  Also has a right superior mediastinal node just adjacent to the SVC. All have been followed for some time on CT imaging concern for potential malignancy. The patient's images have been independently reviewed by me.    03/29/2021: CT scan of the chest: Stable right upper lobe pulmonary nodule. The patient's images have been independently reviewed by me.   CT chest 06/30/2021: Loculated effusion, stable scattered nodular changes Slightly enlarged left paratracheal lymph node. The patient's images have been independently reviewed by me.    Pulmonary Functions Testing Results: PFT Results Latest Ref Rng & Units 08/13/2017 10/04/2016  FVC-Pre L 3.10 3.14  FVC-Predicted Pre % 67 67  FVC-Post L - 3.26  FVC-Predicted Post % - 70  Pre FEV1/FVC % % 64 62   Post FEV1/FCV % % - 64  FEV1-Pre L 1.98 1.96  FEV1-Predicted Pre % 59 58  FEV1-Post L - 2.10  DLCO uncorrected ml/min/mmHg 12.40 14.56  DLCO UNC% % 34 40  DLCO corrected ml/min/mmHg - 14.44  DLCO COR %Predicted % - 40  DLVA Predicted % 56 59  TLC L 7.39 5.77  TLC % Predicted % 97 76  RV % Predicted % 156 92    FeNO:   Pathology:   Echocardiogram:   Heart Catheterization:     Assessment & Plan:     ICD-10-CM   1. Lung nodule  R91.1 CT Super D Chest Wo Contrast    2. COPD GOLD II   J44.9     3. Personal history of colon cancer  Z85.038     4. Adenopathy  R59.9     5. Pleural effusion on right  J90     6. History of lung cancer  Z85.118       Discussion:  This is a 81 year old gentleman history of lung cancer status post right lower lobectomy, history of colon cancer.  Follow-up with medical oncology Dr. Benay Spice.  Had some new nodule in the right upper lobe which showed low-level PET uptake activity in the past.  This felt to be inflammatory but showed partial resolution on repeat CT imaging.  Recent hospitalization with pleural effusion status postthoracentesis negative for malignancy.  Also found to have a left paratracheal node that was slightly enlarged.  Repeat CT imaging today showed that adenopathy  had decreased in size and the nodular area in the right lower lobe still persistent but stable.  Plan: 58-month noncontrasted CT chest for evaluation of the lower lobe nodular area. This will also let us look at the adenopathy again which I think appears to be inflammatory in nature. At this point I think we can continue to have him under active surveillance. He is going to follow-up with medical oncology next week. Repeat images in 6 months, follow-up with [name] in clinic after that. Continue Breztri, triple therapy inhaler. Continue albuterol for shortness of breath and wheezing.    Current Outpatient Medications:    acetaminophen (TYLENOL) 325 MG tablet, Take  650 mg by mouth every 6 (six) hours as needed for moderate pain or headache., Disp: , Rfl:    albuterol (VENTOLIN HFA) 108 (90 Base) MCG/ACT inhaler, Inhale 2 puffs into the lungs every 6 (six) hours as needed for wheezing or shortness of breath., Disp: 8.5 g, Rfl: 0   amLODipine (NORVASC) 5 MG tablet, TAKE ONE TABLET ONCE DAILY,, Disp: 60 tablet, Rfl: 2   apixaban (ELIQUIS) 5 MG TABS tablet, Take 1 tablet (5 mg total) by mouth 2 (two) times daily., Disp: 60 tablet, Rfl: 6   Brinzolamide-Brimonidine (SIMBRINZA) 1-0.2 % SUSP, Place 1 drop into both eyes 2 times daily., Disp: , Rfl:    Budeson-Glycopyrrol-Formoterol (BREZTRI AEROSPHERE) 160-9-4.8 MCG/ACT AERO, Inhale 2 puffs into the lungs in the morning and at bedtime., Disp: 10.7 g, Rfl: 0   docusate sodium (COLACE) 100 MG capsule, Take 100 mg by mouth 2 (two) times daily. , Disp: , Rfl:    empagliflozin (JARDIANCE) 10 MG TABS tablet, Take 1 tablet (10 mg total) by mouth daily before breakfast., Disp: 30 tablet, Rfl: 11   escitalopram (LEXAPRO) 10 MG tablet, TAKE ONE TABLET BY MOUTH EVERY DAY, Disp: 28 tablet, Rfl: 5   furosemide (LASIX) 20 MG tablet, Take 3 tablets (60 mg total) by mouth 2 (two) times daily., Disp: 180 tablet, Rfl: 11   latanoprost (XALATAN) 0.005 % ophthalmic solution, Place 1 drop into both eyes at bedtime., Disp: , Rfl:    losartan (COZAAR) 25 MG tablet, Take 1 tablet (25 mg total) by mouth daily., Disp: 90 tablet, Rfl: 3   Melatonin 10 MG CAPS, Take 10 mg by mouth at bedtime., Disp: , Rfl:    metoprolol succinate (TOPROL-XL) 25 MG 24 hr tablet, TAKE 1/2 TABLET IN THE EVENING WITH OR IMMEDIATELY FOLLOWING A MEAL, Disp: 30 tablet, Rfl: 3   montelukast (SINGULAIR) 10 MG tablet, TAKE ONE TABLET AT BEDTIME, Disp: 28 tablet, Rfl: 5   Multiple Vitamins-Minerals (CENTRUM SILVER PO), Take 1 tablet by mouth daily., Disp: , Rfl:    omeprazole (PRILOSEC) 20 MG capsule, TAKE (1) CAPSULE TWICE DAILY., Disp: 56 capsule, Rfl: 3   potassium  chloride SA (KLOR-CON) 20 MEQ tablet, Take 2 tablets (40 mEq total) by mouth daily., Disp: 60 tablet, Rfl: 3   rosuvastatin (CRESTOR) 40 MG tablet, TAKE ONE TABLET ONCE DAILY, Disp: 60 tablet, Rfl: 3   senna (SENOKOT) 8.6 MG tablet, Take 1 tablet by mouth 2 (two) times daily., Disp: , Rfl:    VASCEPA 1 g capsule, Take 2 capsules (2 g total) by mouth 2 (two) times daily., Disp: 120 capsule, Rfl: 11   benzonatate (TESSALON) 200 MG capsule, Take 1 capsule (200 mg total) by mouth 3 (three) times daily as needed for cough. (Patient not taking: Reported on 10/10/2021), Disp: 20 capsule, Rfl: 0   Leory Plowman  Marvel Plan, DO [facility] 10/13/2021 1:51 PM

## 2021-10-16 ENCOUNTER — Ambulatory Visit (INDEPENDENT_AMBULATORY_CARE_PROVIDER_SITE_OTHER): Payer: PPO

## 2021-10-16 DIAGNOSIS — I480 Paroxysmal atrial fibrillation: Secondary | ICD-10-CM

## 2021-10-17 LAB — CUP PACEART REMOTE DEVICE CHECK
Date Time Interrogation Session: 20230115231323
Implantable Pulse Generator Implant Date: 20200709

## 2021-10-19 ENCOUNTER — Inpatient Hospital Stay: Payer: PPO | Admitting: Oncology

## 2021-10-19 ENCOUNTER — Other Ambulatory Visit: Payer: Self-pay

## 2021-10-19 ENCOUNTER — Inpatient Hospital Stay: Payer: PPO | Attending: Oncology

## 2021-10-19 ENCOUNTER — Telehealth: Payer: Self-pay

## 2021-10-19 VITALS — BP 139/60 | HR 76 | Temp 97.8°F | Resp 18 | Ht 73.0 in | Wt 197.0 lb

## 2021-10-19 DIAGNOSIS — Z8616 Personal history of COVID-19: Secondary | ICD-10-CM | POA: Insufficient documentation

## 2021-10-19 DIAGNOSIS — C187 Malignant neoplasm of sigmoid colon: Secondary | ICD-10-CM | POA: Diagnosis not present

## 2021-10-19 DIAGNOSIS — C3431 Malignant neoplasm of lower lobe, right bronchus or lung: Secondary | ICD-10-CM | POA: Diagnosis not present

## 2021-10-19 DIAGNOSIS — C182 Malignant neoplasm of ascending colon: Secondary | ICD-10-CM | POA: Insufficient documentation

## 2021-10-19 LAB — CEA (ACCESS): CEA (CHCC): 1.88 ng/mL (ref 0.00–5.00)

## 2021-10-19 NOTE — Telephone Encounter (Signed)
-----   Message from Ladell Pier, MD sent at 10/19/2021  1:20 PM EST ----- Please call patient, CEA is normal, follow-up as scheduled

## 2021-10-19 NOTE — Progress Notes (Signed)
[facility] OFFICE PROGRESS NOTE   Diagnosis: Colon cancer, non-small cell lung cancer  INTERVAL HISTORY:  Mr Lemme returns as scheduled.  He saw Dr. Valeta Harms last week.  A repeat chest CT revealed no evidence of of a progressive malignancy.  Good appetite. He was admitted with a COPD exacerbation and COVID-19 07/01/2021.  He had right lower lobe pneumonia.    Objective:  Vital signs in last 24 hours:  Blood pressure 139/60, pulse 76, temperature 97.8 F (36.6 C), temperature source Oral, resp. rate 18, height _0  (1.854 m), weight 197 lb (89.4 kg), SpO2 100 %.    Lymphatics: No cervical, supraclavicular, axillary, or inguinal nodes Resp: Decreased breath sounds at the right compared to the left posterior chest, no respiratory distress Cardio: Regular rate and rhythm GI: No mass, no hepatosplenomegaly Vascular: No leg edema, left lower leg is larger than the right side   Portacath/PICC-without erythema  Lab Results:  Lab Results  Component Value Date   WBC 13.6 (H) 07/11/2021   HGB 13.4 07/11/2021   HCT 40.2 07/11/2021   MCV 91.6 07/11/2021   PLT 436.0 (H) 07/11/2021   NEUTROABS 12.2 (H) 07/11/2021    CMP  Lab Results  Component Value Date   NA 136 08/29/2021   K 3.6 08/29/2021   CL 101 08/29/2021   CO2 27 08/29/2021   GLUCOSE 192 (H) 08/29/2021   BUN 13 08/29/2021   CREATININE 1.32 (H) 08/29/2021   CALCIUM 8.9 08/29/2021   PROT 6.6 07/11/2021   ALBUMIN 3.6 07/11/2021   AST 26 07/11/2021   ALT 47 07/11/2021   ALKPHOS 88 07/11/2021   BILITOT 0.6 07/11/2021   GFRNONAA 55 (L) 08/29/2021   GFRAA 74 05/16/2020    Lab Results  Component Value Date   CEA1 1.19 06/19/2021   CEA 1.88 06/19/2021    Lab Results  Component Value Date   INR 1.8 (H) 07/01/2021   LABPROT 21.2 (H) 07/01/2021    Imaging:  CUP PACEART REMOTE DEVICE CHECK  Result Date: 10/17/2021 ILR summary report received. Battery status OK. Normal device function. No new  symptom, tachy, brady, or pause episodes. No new AF episodes. Monthly summary reports and ROV/PRN- JJB   Medications: I have reviewed the patient's current medications.   Assessment/Plan:  1.  Adenocarcinoma of sigmoid colon-stage II (T3N0)  Colonoscopy 06/25/2018- multiple polyps in the ascending and descending colon, partially obstructing mass in the sigmoid colon-biopsy confirmed adenocarcinoma CTs 06/25/2018-enlarging right paratracheal lymph node compared to November 2018, no other evidence of metastatic disease Low anterior resection 12/12/2018, stage II (T3N0) adenocarcinoma the sigmoid colon, no loss of mismatch repair protein expression, MSI-stable Colonoscopy 06/23/2020-polyps removed from the transverse colon, ascending colon, and cecum-tubular adenomas  CT chest 01/14/2020-stable mildly enlarged high right paratracheal node and right hilar node CT abdomen/pelvis 01/29/2020-no evidence of recurrent disease, stable right lower lobe nodule CT chest 01/24/2021-mild growth of 2 irregular basilar right upper lobe nodules, stable high right mediastinal node PET scan 02/08/2021- band of nodules in the right upper lobe including a 13 mm nodule with an SUV of 3.2 and a 13 mm nodule with an SUV of 1.4.,  12 mm nodule in the upper right mediastinum with an SUV of 3.5     2.  History of iron deficiency anemia Secondary to bleeding from tumor, exacerbated by anticoagulation with eliquis  3. 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 Elenor Quinones at [facility]. Last Ct chest 05/01/18 stable, without concerning evidence of recurrence  -CT 06/25/2018- nonspecific small bilateral nodules, enlarging superior right  paratracheal node -EUS biopsy of the right paratracheal nodes (several rounded nodes measuring 4 cm in total) on 07/10/2018- negative for malignancy -CT chest 03/03/2019- stable postoperative appearance of the chest, unchanged mildly prominent right paratracheal lymph node, no evidence of metastatic disease -CT chest 01/14/2020-stable surgical changes in the right chest, stable mild mediastinal and right hilar adenopathy -CT chest 01/24/2021- mild interval growth of irregular nodular opacities in the right upper lobe along the minor fissure -PET 1/84/0375-OHKG of hypermetabolic nodules in the inferior aspect of the right upper lobe-indeterminate, nodularity increased over time.  Hypermetabolic nodule in the right upper mediastinum, similar small right effusion -CT super D chest 03/21/2021-consolidation and nodularity in the posterior inferior right upper lobe slightly diminished, unchanged loculated small right effusion, fine clustered centrilobular nodularity concentrated in the left upper lobe-unchanged.  No enlarged mediastinal, hilar, or axillary nodes -Right pleural fluid 07/05/2021-reactive mesothelial cells -CT super D chest 09/12/2021-decrease in size of right paratracheal and left mediastinal nodes, stable small right pleural effusion, right lower lobectomy, similar soft tissue thickening in the inferior right upper lobe, upper lobe predominant centrilobular nodularity favored to represent bronchiolitis    4. COPD 5. Atrial fibrillation 6. Thyrotoxicosis-potentially amiodarone related 7.  Multiple gastric polyps on endoscopy 06/23/2020-1 oozing polyp-hyperplastic polyp, biopsy from the gastric antrum revealed intestinal metaplasia      Disposition: Mr Daleo is in clinical remission from colon cancer and lung cancer.  He will continue chest CT follow-up with Dr. Valeta Harms.  We will follow-up on the CEA from today.  He will return for an office visit and CEA in 6 months.  He will be due for a  colonoscopy in 2024.  Betsy Coder, MD  10/19/2021  11:58 AM

## 2021-10-19 NOTE — Telephone Encounter (Signed)
Pt verbalized understanding.

## 2021-10-23 ENCOUNTER — Encounter (HOSPITAL_COMMUNITY): Payer: PPO | Admitting: Cardiology

## 2021-10-23 DIAGNOSIS — L570 Actinic keratosis: Secondary | ICD-10-CM | POA: Diagnosis not present

## 2021-10-23 DIAGNOSIS — M549 Dorsalgia, unspecified: Secondary | ICD-10-CM | POA: Diagnosis not present

## 2021-10-23 DIAGNOSIS — Z85828 Personal history of other malignant neoplasm of skin: Secondary | ICD-10-CM | POA: Diagnosis not present

## 2021-10-23 DIAGNOSIS — L821 Other seborrheic keratosis: Secondary | ICD-10-CM | POA: Diagnosis not present

## 2021-10-23 DIAGNOSIS — D692 Other nonthrombocytopenic purpura: Secondary | ICD-10-CM | POA: Diagnosis not present

## 2021-10-23 DIAGNOSIS — I10 Essential (primary) hypertension: Secondary | ICD-10-CM | POA: Diagnosis not present

## 2021-10-24 ENCOUNTER — Other Ambulatory Visit: Payer: Self-pay

## 2021-10-24 ENCOUNTER — Encounter (HOSPITAL_COMMUNITY): Payer: Self-pay | Admitting: Cardiology

## 2021-10-24 ENCOUNTER — Ambulatory Visit (HOSPITAL_COMMUNITY)
Admission: RE | Admit: 2021-10-24 | Discharge: 2021-10-24 | Disposition: A | Discharge status: Home / Self Care | Payer: PPO | Source: Ambulatory Visit | Attending: Cardiology | Admitting: Cardiology

## 2021-10-24 VITALS — BP 144/70 | HR 59 | Wt 198.0 lb

## 2021-10-24 DIAGNOSIS — N183 Chronic kidney disease, stage 3 unspecified: Secondary | ICD-10-CM | POA: Insufficient documentation

## 2021-10-24 DIAGNOSIS — E0590 Thyrotoxicosis, unspecified without thyrotoxic crisis or storm: Secondary | ICD-10-CM | POA: Insufficient documentation

## 2021-10-24 DIAGNOSIS — I739 Peripheral vascular disease, unspecified: Secondary | ICD-10-CM | POA: Insufficient documentation

## 2021-10-24 DIAGNOSIS — I2510 Atherosclerotic heart disease of native coronary artery without angina pectoris: Secondary | ICD-10-CM | POA: Insufficient documentation

## 2021-10-24 DIAGNOSIS — I714 Abdominal aortic aneurysm, without rupture, unspecified: Secondary | ICD-10-CM | POA: Diagnosis not present

## 2021-10-24 DIAGNOSIS — I509 Heart failure, unspecified: Secondary | ICD-10-CM | POA: Diagnosis not present

## 2021-10-24 DIAGNOSIS — Z902 Acquired absence of lung [part of]: Secondary | ICD-10-CM | POA: Diagnosis not present

## 2021-10-24 DIAGNOSIS — J439 Emphysema, unspecified: Secondary | ICD-10-CM | POA: Diagnosis not present

## 2021-10-24 DIAGNOSIS — I5032 Chronic diastolic (congestive) heart failure: Secondary | ICD-10-CM

## 2021-10-24 DIAGNOSIS — J90 Pleural effusion, not elsewhere classified: Secondary | ICD-10-CM | POA: Diagnosis not present

## 2021-10-24 DIAGNOSIS — Z85118 Personal history of other malignant neoplasm of bronchus and lung: Secondary | ICD-10-CM | POA: Diagnosis not present

## 2021-10-24 DIAGNOSIS — Z8616 Personal history of COVID-19: Secondary | ICD-10-CM | POA: Insufficient documentation

## 2021-10-24 DIAGNOSIS — I130 Hypertensive heart and chronic kidney disease with heart failure and stage 1 through stage 4 chronic kidney disease, or unspecified chronic kidney disease: Secondary | ICD-10-CM | POA: Insufficient documentation

## 2021-10-24 DIAGNOSIS — Z85038 Personal history of other malignant neoplasm of large intestine: Secondary | ICD-10-CM | POA: Diagnosis not present

## 2021-10-24 DIAGNOSIS — Z79899 Other long term (current) drug therapy: Secondary | ICD-10-CM | POA: Insufficient documentation

## 2021-10-24 DIAGNOSIS — R0602 Shortness of breath: Secondary | ICD-10-CM | POA: Diagnosis not present

## 2021-10-24 DIAGNOSIS — Z7984 Long term (current) use of oral hypoglycemic drugs: Secondary | ICD-10-CM | POA: Diagnosis not present

## 2021-10-24 DIAGNOSIS — Z9861 Coronary angioplasty status: Secondary | ICD-10-CM | POA: Diagnosis not present

## 2021-10-24 DIAGNOSIS — Z9049 Acquired absence of other specified parts of digestive tract: Secondary | ICD-10-CM | POA: Insufficient documentation

## 2021-10-24 DIAGNOSIS — I480 Paroxysmal atrial fibrillation: Secondary | ICD-10-CM | POA: Insufficient documentation

## 2021-10-24 DIAGNOSIS — Z7901 Long term (current) use of anticoagulants: Secondary | ICD-10-CM | POA: Diagnosis not present

## 2021-10-24 DIAGNOSIS — E785 Hyperlipidemia, unspecified: Secondary | ICD-10-CM | POA: Diagnosis not present

## 2021-10-24 LAB — BASIC METABOLIC PANEL
Anion gap: 10 (ref 5–15)
BUN: 13 mg/dL (ref 8–23)
CO2: 23 mmol/L (ref 22–32)
Calcium: 9 mg/dL (ref 8.9–10.3)
Chloride: 104 mmol/L (ref 98–111)
Creatinine, Ser: 1.31 mg/dL — ABNORMAL HIGH (ref 0.61–1.24)
GFR, Estimated: 55 mL/min — ABNORMAL LOW (ref >60)
Glucose, Bld: 151 mg/dL — ABNORMAL HIGH (ref 70–99)
Potassium: 4 mmol/L (ref 3.5–5.1)
Sodium: 137 mmol/L (ref 135–145)

## 2021-10-24 LAB — BRAIN NATRIURETIC PEPTIDE: B Natriuretic Peptide: 363.8 pg/mL — ABNORMAL HIGH (ref 0.0–100.0)

## 2021-10-24 LAB — LIPID PANEL
Cholesterol: 80 mg/dL (ref 0–200)
HDL: 31 mg/dL — ABNORMAL LOW (ref >40)
LDL Cholesterol: 34 mg/dL (ref 0–99)
Total CHOL/HDL Ratio: 2.6 RATIO
Triglycerides: 74 mg/dL (ref <150)
VLDL: 15 mg/dL (ref 0–40)

## 2021-10-24 MED ORDER — FUROSEMIDE 80 MG PO TABS: 80.0000 mg | ORAL_TABLET | Freq: Two times a day (BID) | ORAL | 6 refills | Status: DC

## 2021-10-24 MED ORDER — POTASSIUM CHLORIDE CRYS ER 20 MEQ PO TBCR: EXTENDED_RELEASE_TABLET | ORAL | 6 refills | Status: DC

## 2021-10-24 NOTE — Patient Instructions (Addendum)
INCREASE Lasix to 80mg  (1 tab) twice a day  INCREASE Potassium to 40 meq (2 tabs) in the morning and 20 meq (1 tab)  Labs today and repeat in 10 days We will only contact you if something comes back abnormal or we need to make some changes. Otherwise no news is good news!  You have been ordered for a chest xray.  We will call you with the results.  Please call V[facility] to schedule a follow up visit.  Your physician recommends that you schedule a follow-up appointment in: 3 weeks with Nurse Practitioner/Physician Assistant  Please call office at [phone] option 2 if you have any questions or concerns.   At the [facility], you and your health needs are our priority. As part of our continuing mission to provide you with exceptional heart care, we have created designated Provider Care Teams. These Care Teams include your primary Cardiologist (physician) and Advanced Practice Providers (APPs- Physician Assistants and Nurse Practitioners) who all work together to provide you with the care you need, when you need it.   You may see any of the following providers on your designated Care Team at your next follow up: Dr Glori Bickers Dr Haynes Kerns, NP Lyda Jester, [place] [facility] [place], [place] Audry Riles, PharmD   Please be sure to bring in all your medications bottles to every appointment.

## 2021-10-24 NOTE — Progress Notes (Deleted)
.  vest

## 2021-10-24 NOTE — Progress Notes (Signed)
ReDS Vest / Clip - 10/24/21 1200       ReDS Vest / Clip   Station Marker C    Ruler Value 31    ReDS Value Range Low volume    ReDS Actual Value 33

## 2021-10-25 DIAGNOSIS — J439 Emphysema, unspecified: Secondary | ICD-10-CM | POA: Diagnosis not present

## 2021-10-25 NOTE — Progress Notes (Signed)
Patient ID: Troy Uher [name], male   DOB: January 10, 1941, 81 y.o.   MRN: [id number] PCP: Dr. Quay Burow Cardiology: [name]  81 y.o. with history of HTN, COPD, active smoking/COPD, carotid stenosis s/p right CEA, paroxysmal atrial fibrillation, and PAD.   He does not have known obstructive CAD but is at high risk for CAD based on his comorbidities.  Lexiscan Cardiolite in 3/14 showed no ischemia or infarction and echo in 3/14 showed normal EF.  He had a left fem-pop bypass at the [facility] in the '90s.  He is followed at [facility] for PAD. He has chronic right calf/thigh/buttocks claudication that is unchanged over the last few years and follows regularly at [facility].  He has had right lower lobectomy for lung cancer.  He continues to stay off cigarettes.    At a prior appointment, he was in atrial fibrillation but did not realize it. Dr Aundra Dubin  started him on Eliquis and diltiazem CD, and he spontaneously converted to NSR.  In 5/15, he was at [facility] and felt "strange" one day: fatigued, weak, short of breath.  He went to the ER and was in atrial fibrillation with HR in 80s-90s.  He spontaneously converted to NSR in the ER.  He felt back to normal after converting to NSR.  Started on Multaq 400 mg bid.  With CHF, he was eventually transitioned over to amiodarone.   He had patch angioplasty revision of left fem-pop bypass in 11/16.  Now with minimal claudication.  He follows with [facility].     He had a Cardiolite and echo in 7/17 that were unremarkable.  Repeat Cardiolite in 12/17 showed no ischemia, echo was uninterpretable.  Cardiac MRI was therefore done in 1/18, showing EF 66% with normal-appearing RV, no late gadolinium enhancement.   Given increased exertional dyspnea and a defect on Cardiolite, he had LHC in 11/18.  This showed no obstructive CAD. He had a chest CT done given concern for possible amiodarone lung toxicity with increased dyspnea. This showed emphysema, no ILD.    In 5/19, he developed a  probable viral syndrome with dehydration and had a presyncopal episode.  He was orthostatic in the hospital.  He was noted to be in atrial fibrillation this admission which remained persistent.  He had a TEE-guided DCCV back to NSR.   In 9/19, he was admitted with GI bleeding and found to have a sigmoid mass on colonoscopy, path showed sigmoid adenocarcinoma.  He was noted to have right paratracheal lymphadenopathy but EUS with biopsy in 10/19 did not show malignancy.  He had surgical resection of colon cancer.   Amiodarone was stopped with elevated ESR and increased dyspnea.  He was also found to have hyperthyroidism. He was treated with methimazole by endocrinology.    He had atrial fibrillation ablation in 7/20.   Echo in 4/21 showed EF 60-65%, normal RV.    In 5/21, due to high atrial fibrillation burden, he had redo atrial fibrillation ablation.   Cardiolite in 6/21 showed EF 70%, no ischemia/infarction.   He was admitted in 10/22 with COVID-19 and COPD exacerbation.  He had a thoracentesis, cytology was negative for cancerous cells.  Echo in 10/22 showed EF 55-60%, moderate LVH, mildly decreased RV systolic function.   He returns today for followup of CHF.  Yesterday, he bent over to tie his shoes and developed severe mid back pain radiating around his sides.  He called EMS, ECG showed NSR.  He did not got to  the ER.  The back pain felt like spasms and has now resolved.  He has been more short of breath recently.  He occasionally wheezes. No cough, no fever/congestion.  He is dyspneic after walking 100-200 feet.  No orthopnea/PND.  No chest pain.  He is in NSR today. He has, of note, been eating out a lot and generally following a high sodium diet.   ECG (personally reviewed): NSR, 1st degree AVB, RBBB  Labs (3/13): LDL 75, HDL 35, K 4.1, creatinine 1.1 Labs (3/14): LDL 91, HDL 27 Labs (10/14): K 4.3, creatinine 0.98 Labs (4/15): K 4.9, creatinine 1.1, LDL 76, HDL 30 Labs (5/15): K 4.3,  creatinine 1.7, BNP 251 Labs (6/15): K 4.4, creatinine 1.3 Labs (10/15): TSH normal Labs (5/16): K 4.2, creatinine 1.12, HCT 39.5, LFTs normal, LDL 84, HDL 43, TSH normal Labs (11/16): creatinine 0.94 Labs (4/17): LDL 39, HDL 39 Labs (6/17): K 4.5, creatinine 1.2 Labs (9/17): K 4, creatinine 1.25, HCT 38.7 Labs (12/17): K 4.5, creatinine 1.49 => 1.62, BNP 43 Labs (2/18): K 3.8, creatinine 1.33, LFTs normal, TSH normal Labs (6/18): K 4.3, creatinine 1.49, LFTs normal, TSH normal, hgb 12.3 Labs (11/18): ESR 60, TSH normal, LFTs normal, creatinine 1.34 Labs (1/19): LDL 50 Labs (5/19): K 4.6, creatinine 1.55, LFTs normal, hgb 12.7, LFTs normal Labs (10/19): K 4 => 4.1, creatinine 1.39 => 1.43, BNP 279, hgb 10.7, TSH low, free T4 and free T3 low, ESR 108 Labs (3/21): K 4.5, creatinine 1.19, TSH normal Labs (4/21): K 4.8, creatinine 1.28, BNP 524, LDL 59 Labs (7/21): hgb 11.5, creatinine 1.4 Labs (8/21): K 4.3, creatinine 1.09 Labs (9/21): hgb 12.7 Labs (2/22): K 5, creatinine 1.54 Labs (5/22): K 4.2, creatinine 1.38, hgb 14.3, LDL 31 Labs (10/22): LDL 41, TGs 351, hgb 13.4, K 4.3, creatinine 1.57 Labs (1122): K 3.6, creatinine 1.32  PMH: 1. HTN: Fatigue and cough with ramipril use.  2. COPD: Quit smoking 2014. PFTs (1/18) with moderately severe COPD.  - CT chest (11/18): Emphysema noted, no ILD.  3. AAA: CT 1/13 with 3.0 cm AAA.  Abdominal US (1/14) with 3.4 cm AAA. Abdominal US (4/15) with 3.25 x 3.27 AAA. Abdominal US (3/16) with 3.7 cm AAA.  Followed at [facility].  - Abd [name] (1/19): 3.3 cm AAA. 4. Squamous cell lung cancer diagnosed 2/12.  Had right lower lobectomy in 4/12.  5. Hyperlipidemia 6. PAD: Left fem-pop bypass 1994.  ABIs (2/12) 0.62 on right, 0.95 on left. ABIs (1/14): 0.65 on right, 1.04 on left. ABIs (4/15) 0.66 right, 0.87 left.  ABIs (3/16) 0.6 right, 0.88 left.  Patch angioplasty left fem-pop bypass in 11/16.   - Left fem-pop bypass patent on doppler evaluation in 12/17.  -  ABIs (2/21): normal on left (1.02), moderately decreased on right (0.54).  7. CAD: Stress myoview 2004 was normal. Lexiscan Cardiolite (3/14) with EF 66%, no ischemia or infarction. Lexiscan Cardiolite (7/17) with EF 61%, no ischemia/infarction.  - Cardiolite (12/17) with EF 62%, inferior/inferolateral fixed defect, most likely diaphragmatic attenuation, no ischemia.  - Cardiolite (9/18) with EF > 65%, fixed inferior defect (attenuation versus infarction), no ischemia.  - LHC (11/18): No obstructive CAD.  - Cardiolite (6/21): EF 70%, no ischemia/infarction 8. Chronic diastolic CHF: Echo (6/27): technically difficult with EF 55-60%, upper normal RV size.  Echo (5/16) with EF 60-65%.  Echo (7/17) with EF 55-60%, normal RV size and systolic function.  - Echo 12/17 with very poor windows, unable to comment on LV  or RV function.  - Cardiac MRI (1/18) with EF 66%, normal RV size and systolic function.  - TEE (5/19): EF 55-60%, normal RV size and systolic function.  - Echo (4/21): EF 60-65%, normal RV - Echo (10/22): EF 55-60%, moderate LVH, mildly decreased RV systolic function 9. Carotid stenosis: TIA 10/14.  Carotid dopplers with > 23% RICA, 76-28% LICA.  Patient had right CEA in 10/14. Carotids (4/15) patent right CEA, LICA 31-51% stenosis.  Carotids (76/16) < 07% LICA, patent right CEA.  - Carotid dopplers (12/17): right CEA ok, < 37% LICA stenosis.  - Carotid dopplers (1/19): Right CEA ok, 1-06% LICA stenosis.  - Carotid dopplers (2/21): Right CEA ok, LICA 26-94% stenosis.  10. Atrial fibrillation: Paroxysmal. First noted after lobectomy in 4/12 (brief), recurrence in 4/15 then in 5/15.  - TEE-guided DCCV for persistent atrial fibrillation in 5/19.  - Atrial fibrillation ablation 7/20.  - Re-do atrial fibrillation ablation 5/21.  11. Spinal stenosis 12. OSA: Using CPAP.  13. Glaucoma 14. Has ILR 15. Colon cancer: Sigmoid adenocarcinoma, s/p surgical resection.  16. 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  17. COVID-19 10/22  SH: Married, lives in [place].  Former [employer].  Quit smoking in 2014.  Rare ETOH now.   FH: No premature CAD  ROS: All systems reviewed and negative except as per HPI.   Current Outpatient Medications  Medication Sig Dispense Refill   acetaminophen (TYLENOL) 325 MG tablet Take 650 mg by mouth every 6 (six) hours as needed for moderate pain or headache.     albuterol (VENTOLIN HFA) 108 (90 Base) MCG/ACT inhaler Inhale 2 puffs into the lungs every 6 (six) hours as needed for wheezing or shortness of breath. 8.5 g 0   amLODipine (NORVASC) 5 MG tablet TAKE ONE TABLET ONCE DAILY, 60 tablet 2   apixaban (ELIQUIS) 5 MG TABS tablet Take 1 tablet (5 mg total) by mouth 2 (two) times daily. 60 tablet 6   Brinzolamide-Brimonidine (SIMBRINZA) 1-0.2 % SUSP Place 1 drop into both eyes 2 times daily.     Budeson-Glycopyrrol-Formoterol (BREZTRI AEROSPHERE) 160-9-4.8 MCG/ACT AERO Inhale 2 puffs into the lungs in the morning and at bedtime. 10.7 g 0   docusate sodium (COLACE) 100 MG capsule Take 100 mg by mouth 2 (two) times daily.      empagliflozin (JARDIANCE) 10 MG TABS tablet Take 1 tablet (10 mg total) by mouth daily before breakfast. 30 tablet 11   escitalopram (LEXAPRO) 10 MG tablet TAKE ONE TABLET BY MOUTH EVERY DAY 28 tablet 5   latanoprost (XALATAN) 0.005 % ophthalmic solution Place 1 drop into both eyes at bedtime.     losartan (COZAAR) 25 MG tablet Take 1 tablet (25 mg total) by mouth daily. 90 tablet 3   Melatonin 10 MG TABS Take 2 tablets by mouth at bedtime.     metoprolol succinate (TOPROL-XL) 25 MG 24 hr tablet TAKE 1/2 TABLET IN THE EVENING WITH OR IMMEDIATELY FOLLOWING A MEAL 30 tablet 3   montelukast (SINGULAIR) 10 MG tablet TAKE ONE TABLET AT BEDTIME 28 tablet 5   Multiple Vitamins-Minerals (CENTRUM SILVER PO) Take 1 tablet by mouth daily.     omeprazole (PRILOSEC) 20 MG capsule TAKE (1) CAPSULE TWICE DAILY. 56 capsule  3   rosuvastatin (CRESTOR) 40 MG tablet TAKE ONE TABLET ONCE DAILY 60 tablet 3   senna (SENOKOT) 8.6 MG tablet Take 1 tablet by mouth 2 (two) times daily.     VASCEPA 1 g  capsule Take 2 capsules (2 g total) by mouth 2 (two) times daily. 120 capsule 11   furosemide (LASIX) 80 MG tablet Take 1 tablet (80 mg total) by mouth 2 (two) times daily. 60 tablet 6   potassium chloride SA (KLOR-CON M) 20 MEQ tablet Take 2 tablets (40 mEq total) by mouth every morning AND 1 tablet (20 mEq total) every evening. 90 tablet 6   No current facility-administered medications for this encounter.   Wt Readings from Last 3 Encounters:  10/24/21 89.8 kg (198 lb)  10/19/21 89.4 kg (197 lb)  10/13/21 92.1 kg (203 lb)    BP (!) 144/70    Pulse (!) 59    Wt 89.8 kg (198 lb)    SpO2 95%    BMI 26.12 kg/m    General: NAD Neck: JVP 8-9 cm with HJR, no thyromegaly or thyroid nodule.  Lungs: Decreased BS right base.  CV: Nondisplaced PMI.  Heart regular S1/S2, no S3/S4, no murmur.  Trace ankle edema.  No carotid bruit.  Unable to palpate pedal pulses.  Abdomen: Soft, nontender, no hepatosplenomegaly, no distention.  Skin: Intact without lesions or rashes.  Neurologic: Alert and oriented x 3.  Psych: Normal affect. Extremities: No clubbing or cyanosis.  HEENT: Normal.   Assessment/Plan: 1. Chronic diastolic CHF: Echo in 67/54 with EF 55-60%, mild LVH, mildly dysfunctional RV.  NYHA class III symptoms, mild volume overload on exam with weight up 3 lbs.     - Increase Lasix to 80 mg bid with  BMET/BNP today.  BMET in 10 days.  - Increase KCl to 40 qam/20 qpm.  - Continue Jardiance 10 mg daily.  - I will arrange for CXR.  2. Hyperlipidemia: 10/22 lipids with good LDL, high TGs. - Continue Crestor 40 mg daily.  - Continue Vascepa 2 g bid. Lipids today.   3. Carotid stenosis: s/p R CEA.  Dopplers followed at [facility].  - Due for repeat dopplers, needs [facility] appt.  4. PAD: s/p patch angioplasty to left fem-pop bypass in  11/16.  Followed at [facility].  Moderately decreased ABI on right in 2/21, but no pain or pedal ulcerations.  Stable mild claudication.  - Followup with [facility], due for ABIs. 5. AAA: Stable on last [name], followed at [facility].   - Due for AAA [name], needs [facility] followup.   6. COPD: No longer smoking. PFTs in 1/18 showed moderately severe obstructive defect. CT chest in 11/18 showed emphysema.   7. Atrial fibrillation: S/p atrial fibrillation ablation in 7/20 and redo in 5/21.  He has been primarily in NSR since redo ablation.  He is in NSR today.  - Continue Toprol XL 12.5 mg daily.  - Continue Eliquis 5 mg bid.  - Continue to limit ETOH.  - Continue to use CPAP.  8. CKD: Stage 3.  BMET today.  9. CAD: LHC in 11/18 with nonobstructive disease only.  Cardiolite in 6/21 with no ischemia/infarction. No chest pain.  10. 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  He is no longer on methimazole, follows with endocrinology.     11. Colon cancer: Sigmoid adenocarcinoma, s/p surgical resection. In remission.  12. HTN: Stable.   13. Pleural effusion: Chronic right pleural effusion, s/p right lower lobectomy.     14. Squamous cell lung cancer: S/p right lower lobectomy.  Follows with Dr. Valeta Harms.   Followup 3 wks with APP.   [name] 10/25/2021

## 2021-10-26 NOTE — Progress Notes (Signed)
Carelink Summary Report / Loop Recorder 

## 2021-10-31 DIAGNOSIS — J189 Pneumonia, unspecified organism: Secondary | ICD-10-CM | POA: Diagnosis not present

## 2021-10-31 DIAGNOSIS — J440 Chronic obstructive pulmonary disease with acute lower respiratory infection: Secondary | ICD-10-CM | POA: Diagnosis not present

## 2021-11-06 ENCOUNTER — Ambulatory Visit (HOSPITAL_COMMUNITY)
Admission: RE | Admit: 2021-11-06 | Discharge: 2021-11-06 | Disposition: A | Discharge status: Home / Self Care | Payer: PPO | Source: Ambulatory Visit | Attending: Internal Medicine | Admitting: Internal Medicine

## 2021-11-06 ENCOUNTER — Other Ambulatory Visit: Payer: Self-pay

## 2021-11-06 DIAGNOSIS — I5032 Chronic diastolic (congestive) heart failure: Secondary | ICD-10-CM | POA: Insufficient documentation

## 2021-11-06 LAB — BASIC METABOLIC PANEL
Anion gap: 11 (ref 5–15)
BUN: 16 mg/dL (ref 8–23)
CO2: 24 mmol/L (ref 22–32)
Calcium: 9 mg/dL (ref 8.9–10.3)
Chloride: 103 mmol/L (ref 98–111)
Creatinine, Ser: 1.61 mg/dL — ABNORMAL HIGH (ref 0.61–1.24)
GFR, Estimated: 43 mL/min — ABNORMAL LOW (ref >60)
Glucose, Bld: 113 mg/dL — ABNORMAL HIGH (ref 70–99)
Potassium: 4.5 mmol/L (ref 3.5–5.1)
Sodium: 138 mmol/L (ref 135–145)

## 2021-11-14 NOTE — Progress Notes (Addendum)
Patient ID: Troy Icenogle [name], male   DOB: [birth year], 81 y.o.   MRN: [id number] PCP: Dr. Quay Burow Cardiology: [name]  81 y.o. with history of HTN, COPD, active smoking/COPD, carotid stenosis s/p right CEA, paroxysmal atrial fibrillation, and PAD.   He does not have known obstructive CAD but is at high risk for CAD based on his comorbidities.  Lexiscan Cardiolite in 3/14 showed no ischemia or infarction and echo in 3/14 showed normal EF.  He had a left fem-pop bypass at the [facility] in the '90s.  He is followed at [facility] for PAD. He has chronic right calf/thigh/buttocks claudication that is unchanged over the last few years and follows regularly at [facility].  He has had right lower lobectomy for lung cancer.  He continues to stay off cigarettes.    At a prior appointment, he was in atrial fibrillation but did not realize it. Dr Aundra Dubin  started him on Eliquis and diltiazem CD, and he spontaneously converted to NSR.  In 5/15, he was at [facility] and felt "strange" one day: fatigued, weak, short of breath.  He went to the ER and was in atrial fibrillation with HR in 80s-90s.  He spontaneously converted to NSR in the ER.  He felt back to normal after converting to NSR.  Started on Multaq 400 mg bid.  With CHF, he was eventually transitioned over to amiodarone.   He had patch angioplasty revision of left fem-pop bypass in 11/16.  Now with minimal claudication.  He follows with [facility].     He had a Cardiolite and echo in 7/17 that were unremarkable.  Repeat Cardiolite in 12/17 showed no ischemia, echo was uninterpretable.  Cardiac MRI was therefore done in 1/18, showing EF 66% with normal-appearing RV, no late gadolinium enhancement.   Given increased exertional dyspnea and a defect on Cardiolite, he had LHC in 11/18.  This showed no obstructive CAD. He had a chest CT done given concern for possible amiodarone lung toxicity with increased dyspnea. This showed emphysema, no ILD.    In 5/19, he developed a  probable viral syndrome with dehydration and had a presyncopal episode.  He was orthostatic in the hospital.  He was noted to be in atrial fibrillation this admission which remained persistent.  He had a TEE-guided DCCV back to NSR.   In 9/19, he was admitted with GI bleeding and found to have a sigmoid mass on colonoscopy, path showed sigmoid adenocarcinoma.  He was noted to have right paratracheal lymphadenopathy but EUS with biopsy in 10/19 did not show malignancy.  He had surgical resection of colon cancer.   Amiodarone was stopped with elevated ESR and increased dyspnea.  He was also found to have hyperthyroidism. He was treated with methimazole by endocrinology.    He had atrial fibrillation ablation in 7/20.   Echo in 4/21 showed EF 60-65%, normal RV.    In 5/21, due to high atrial fibrillation burden, he had redo atrial fibrillation ablation.   Cardiolite in 6/21 showed EF 70%, no ischemia/infarction.   He was admitted in 10/22 with COVID-19 and COPD exacerbation.  He had a thoracentesis, cytology was negative for cancerous cells.  Echo in 10/22 showed EF 55-60%, moderate LVH, mildly decreased RV systolic function.   Follow up 1/23 he was mildly volume overloaded, ReDs 33%. Lasix increased to 80 bid. Repeat CXR arranged to follow chronic right pleural effusion-->stable.  Today he returns for HF follow up. He is SOB with walking further  distances on flat ground or exercising in driveway. Occasional dizziness. Denies CP, palpations, abnormal bleeding, edema, or PND/Orthopnea. Appetite ok. No fever or chills. Weight at home 189 pounds. Taking all medications. He is up urinating 6-7x at night and is unhappy about this. Eats out a lot and generally follows a high sodium diet. He is compliant with his CPAP.  ReDs: 25%  Linq interrogation: several episodes of AF, most lasting < 1 hour (personally reviewed).  ECG (personally reviewed): none ordered today.  Labs (3/13): LDL 75, HDL 35, K  4.1, creatinine 1.1 Labs (3/14): LDL 91, HDL 27 Labs (10/14): K 4.3, creatinine 0.98 Labs (4/15): K 4.9, creatinine 1.1, LDL 76, HDL 30 Labs (5/15): K 4.3, creatinine 1.7, BNP 251 Labs (6/15): K 4.4, creatinine 1.3 Labs (10/15): TSH normal Labs (5/16): K 4.2, creatinine 1.12, HCT 39.5, LFTs normal, LDL 84, HDL 43, TSH normal Labs (11/16): creatinine 0.94 Labs (4/17): LDL 39, HDL 39 Labs (6/17): K 4.5, creatinine 1.2 Labs (9/17): K 4, creatinine 1.25, HCT 38.7 Labs (12/17): K 4.5, creatinine 1.49 => 1.62, BNP 43 Labs (2/18): K 3.8, creatinine 1.33, LFTs normal, TSH normal Labs (6/18): K 4.3, creatinine 1.49, LFTs normal, TSH normal, hgb 12.3 Labs (11/18): ESR 60, TSH normal, LFTs normal, creatinine 1.34 Labs (1/19): LDL 50 Labs (5/19): K 4.6, creatinine 1.55, LFTs normal, hgb 12.7, LFTs normal Labs (10/19): K 4 => 4.1, creatinine 1.39 => 1.43, BNP 279, hgb 10.7, TSH low, free T4 and free T3 low, ESR 108 Labs (3/21): K 4.5, creatinine 1.19, TSH normal Labs (4/21): K 4.8, creatinine 1.28, BNP 524, LDL 59 Labs (7/21): hgb 11.5, creatinine 1.4 Labs (8/21): K 4.3, creatinine 1.09 Labs (9/21): hgb 12.7 Labs (2/22): K 5, creatinine 1.54 Labs (5/22): K 4.2, creatinine 1.38, hgb 14.3, LDL 31 Labs (10/22): LDL 41, TGs 351, hgb 13.4, K 4.3, creatinine 1.57 Labs (1122): K 3.6, creatinine 1.32 Labs (1/23): K 4.0, creatinine 1.31, LDL 34  PMH: 1. HTN: Fatigue and cough with ramipril use.  2. COPD: Quit smoking 2014. PFTs (1/18) with moderately severe COPD.  - CT chest (11/18): Emphysema noted, no ILD.  3. AAA: CT 1/13 with 3.0 cm AAA.  Abdominal US (1/14) with 3.4 cm AAA. Abdominal US (4/15) with 3.25 x 3.27 AAA. Abdominal US (3/16) with 3.7 cm AAA.  Followed at [facility].  - Abd [name] (1/19): 3.3 cm AAA. 4. Squamous cell lung cancer diagnosed 2/12.  Had right lower lobectomy in 4/12.  5. Hyperlipidemia 6. PAD: Left fem-pop bypass 1994.  ABIs (2/12) 0.62 on right, 0.95 on left. ABIs (1/14): 0.65 on  right, 1.04 on left. ABIs (4/15) 0.66 right, 0.87 left.  ABIs (3/16) 0.6 right, 0.88 left.  Patch angioplasty left fem-pop bypass in 11/16.   - Left fem-pop bypass patent on doppler evaluation in 12/17.  - ABIs (2/21): normal on left (1.02), moderately decreased on right (0.54).  7. CAD: Stress myoview 2004 was normal. Lexiscan Cardiolite (3/14) with EF 66%, no ischemia or infarction. Lexiscan Cardiolite (7/17) with EF 61%, no ischemia/infarction.  - Cardiolite (12/17) with EF 62%, inferior/inferolateral fixed defect, most likely diaphragmatic attenuation, no ischemia.  - Cardiolite (9/18) with EF > 65%, fixed inferior defect (attenuation versus infarction), no ischemia.  - LHC (11/18): No obstructive CAD.  - Cardiolite (6/21): EF 70%, no ischemia/infarction 8. Chronic diastolic CHF: Echo (0/09): technically difficult with EF 55-60%, upper normal RV size.  Echo (5/16) with EF 60-65%.  Echo (7/17) with EF 55-60%, normal RV size  and systolic function.  - Echo 12/17 with very poor windows, unable to comment on LV or RV function.  - Cardiac MRI (1/18) with EF 66%, normal RV size and systolic function.  - TEE (5/19): EF 55-60%, normal RV size and systolic function.  - Echo (4/21): EF 60-65%, normal RV - Echo (10/22): EF 55-60%, moderate LVH, mildly decreased RV systolic function 9. Carotid stenosis: TIA 10/14.  Carotid dopplers with > 90% RICA, 30-09% LICA.  Patient had right CEA in 10/14. Carotids (4/15) patent right CEA, LICA 23-30% stenosis.  Carotids (07/62) < 26% LICA, patent right CEA.  - Carotid dopplers (12/17): right CEA ok, < 33% LICA stenosis.  - Carotid dopplers (1/19): Right CEA ok, 3-54% LICA stenosis.  - Carotid dopplers (2/21): Right CEA ok, LICA 56-25% stenosis.  10. Atrial fibrillation: Paroxysmal. First noted after lobectomy in 4/12 (brief), recurrence in 4/15 then in 5/15.  - TEE-guided DCCV for persistent atrial fibrillation in 5/19.  - Atrial fibrillation ablation 7/20.  - Re-do  atrial fibrillation ablation 5/21.  11. Spinal stenosis 12. OSA: Using CPAP.  13. Glaucoma 14. Has ILR 15. Colon cancer: Sigmoid adenocarcinoma, s/p surgical resection.  16. 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  17. COVID-19 10/22  SH: Married, lives in [place].  Former [place].  Quit smoking in 2014.  Rare ETOH now.   FH: No premature CAD  ROS: All systems reviewed and negative except as per HPI.   Current Outpatient Medications  Medication Sig Dispense Refill   acetaminophen (TYLENOL) 325 MG tablet Take 650 mg by mouth every 6 (six) hours as needed for moderate pain or headache.     albuterol (VENTOLIN HFA) 108 (90 Base) MCG/ACT inhaler Inhale 2 puffs into the lungs every 6 (six) hours as needed for wheezing or shortness of breath. 8.5 g 0   amLODipine (NORVASC) 5 MG tablet TAKE ONE TABLET ONCE DAILY, 60 tablet 2   apixaban (ELIQUIS) 5 MG TABS tablet Take 1 tablet (5 mg total) by mouth 2 (two) times daily. 60 tablet 6   Brinzolamide-Brimonidine (SIMBRINZA) 1-0.2 % SUSP Place 1 drop into both eyes 2 times daily.     Budeson-Glycopyrrol-Formoterol (BREZTRI AEROSPHERE) 160-9-4.8 MCG/ACT AERO Inhale 2 puffs into the lungs in the morning and at bedtime. 10.7 g 0   docusate sodium (COLACE) 100 MG capsule Take 100 mg by mouth 2 (two) times daily.      empagliflozin (JARDIANCE) 10 MG TABS tablet Take 1 tablet (10 mg total) by mouth daily before breakfast. 30 tablet 11   escitalopram (LEXAPRO) 10 MG tablet TAKE ONE TABLET BY MOUTH EVERY DAY 28 tablet 5   furosemide (LASIX) 80 MG tablet Take 1 tablet (80 mg total) by mouth 2 (two) times daily. 60 tablet 6   latanoprost (XALATAN) 0.005 % ophthalmic solution Place 1 drop into both eyes at bedtime.     losartan (COZAAR) 25 MG tablet Take 1 tablet (25 mg total) by mouth daily. 90 tablet 3   Melatonin 10 MG TABS Take 2 tablets by mouth at bedtime.     metoprolol succinate (TOPROL-XL) 25 MG 24 hr tablet TAKE 1/2  TABLET IN THE EVENING WITH OR IMMEDIATELY FOLLOWING A MEAL 30 tablet 3   montelukast (SINGULAIR) 10 MG tablet TAKE ONE TABLET AT BEDTIME 28 tablet 5   Multiple Vitamins-Minerals (CENTRUM SILVER PO) Take 1 tablet by mouth daily.     omeprazole (PRILOSEC) 20 MG capsule TAKE (1) CAPSULE TWICE DAILY. 56 capsule 3  potassium chloride SA (KLOR-CON M) 20 MEQ tablet Take 2 tablets (40 mEq total) by mouth every morning AND 1 tablet (20 mEq total) every evening. 90 tablet 6   rosuvastatin (CRESTOR) 40 MG tablet TAKE ONE TABLET ONCE DAILY 60 tablet 3   senna (SENOKOT) 8.6 MG tablet Take 1 tablet by mouth 2 (two) times daily.     VASCEPA 1 g capsule Take 2 capsules (2 g total) by mouth 2 (two) times daily. 120 capsule 11   No current facility-administered medications for this encounter.   Wt Readings from Last 3 Encounters:  11/15/21 88.4 kg (194 lb 12.8 oz)  10/24/21 89.8 kg (198 lb)  10/19/21 89.4 kg (197 lb)   BP 120/60    Pulse 67    Wt 88.4 kg (194 lb 12.8 oz)    SpO2 96%    BMI 25.70 kg/m    General:  NAD. No resp difficulty HEENT: Normal Neck: Supple. No JVD. Carotids 2+ bilat; no bruits. No lymphadenopathy or thryomegaly appreciated. Cor: PMI nondisplaced. Regular rate & rhythm. No rubs, gallops or murmurs. Lungs: Clear, decrease RLL Abdomen: Soft, nontender, nondistended. No hepatosplenomegaly. No bruits or masses. Good bowel sounds. Extremities: No cyanosis, clubbing, rash, edema Neuro: Alert & oriented x 3, cranial nerves grossly intact. Moves all 4 extremities w/o difficulty. Affect pleasant.  Assessment/Plan: 1. Chronic diastolic CHF: Echo in 55/97 with EF 55-60%, mild LVH, mildly dysfunctional RV.  Chronic NYHA class III symptoms, he is not volume overload on exam, weight down 4lbs, ReDs 25%  - Continue Lasix 80 mg bid. BMET/BNP today. - Continue KCl 40 qam/20 qpm.  - Continue Jardiance 10 mg daily.  - Continue losartan 25 mg daily. - Continue Toprol XL 12.5 mg daily. 2.  Hyperlipidemia: 10/22 lipids with good LDL, high TGs. - Continue Crestor 40 mg daily.  - Continue Vascepa 2 g bid. Lipids ok 1/23.  3. Carotid stenosis: s/p R CEA.  Dopplers followed at [facility].  - Due for repeat dopplers, he has [facility] appt.  4. PAD: s/p patch angioplasty to left fem-pop bypass in 11/16.  Followed at [facility].  Moderately decreased ABI on right in 2/21, but no pain or pedal ulcerations.  Stable mild claudication.  - Followup with [facility], due for ABIs. 5. AAA: Stable on last [name], followed at [facility].   - Due for AAA [name], needs [facility] followup.   6. COPD: No longer smoking. PFTs in 1/18 showed moderately severe obstructive defect. CT chest in 11/18 showed emphysema.   7. Atrial fibrillation: S/p atrial fibrillation ablation in 7/20 and redo in 5/21.  He has been primarily in NSR since redo ablation.  Regular on exam today. Short episodes of AF on Linq review.  - Continue Toprol XL 12.5 mg daily.  - Continue Eliquis 5 mg bid.  - Continue to limit ETOH.  - Continue to use CPAP.  8. CKD: Stage 3.  BMET today.  9. CAD: LHC in 11/18 with nonobstructive disease only.  Cardiolite in 6/21 with no ischemia/infarction. No chest pain.  10. 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  He is no longer on methimazole, follows with endocrinology.     11. Colon cancer: Sigmoid adenocarcinoma, s/p surgical resection. In remission.  12. HTN: Stable.   13. Pleural effusion: Chronic right pleural effusion, s/p right lower lobectomy.   - Stable on CXR 10/24/21.   14. Squamous cell lung cancer: S/p right lower lobectomy.  Follows with Dr. Valeta Harms.   I advised follow up in 6 weeks,  he prefers 2-3 months with [name]. I asked him to call if he has increasing SOB/edema.  [place] FNP 11/15/2021

## 2021-11-15 ENCOUNTER — Encounter (HOSPITAL_COMMUNITY): Payer: Self-pay

## 2021-11-15 ENCOUNTER — Ambulatory Visit (HOSPITAL_COMMUNITY)
Admission: RE | Admit: 2021-11-15 | Discharge: 2021-11-15 | Disposition: A | Discharge status: Home / Self Care | Payer: PPO | Source: Ambulatory Visit | Attending: Family Medicine | Admitting: Family Medicine

## 2021-11-15 ENCOUNTER — Other Ambulatory Visit: Payer: Self-pay

## 2021-11-15 VITALS — BP 120/60 | HR 67 | Wt 194.8 lb

## 2021-11-15 DIAGNOSIS — C187 Malignant neoplasm of sigmoid colon: Secondary | ICD-10-CM | POA: Insufficient documentation

## 2021-11-15 DIAGNOSIS — Z79899 Other long term (current) drug therapy: Secondary | ICD-10-CM | POA: Diagnosis not present

## 2021-11-15 DIAGNOSIS — Z85038 Personal history of other malignant neoplasm of large intestine: Secondary | ICD-10-CM | POA: Diagnosis not present

## 2021-11-15 DIAGNOSIS — I10 Essential (primary) hypertension: Secondary | ICD-10-CM

## 2021-11-15 DIAGNOSIS — I130 Hypertensive heart and chronic kidney disease with heart failure and stage 1 through stage 4 chronic kidney disease, or unspecified chronic kidney disease: Secondary | ICD-10-CM | POA: Insufficient documentation

## 2021-11-15 DIAGNOSIS — J439 Emphysema, unspecified: Secondary | ICD-10-CM | POA: Insufficient documentation

## 2021-11-15 DIAGNOSIS — I480 Paroxysmal atrial fibrillation: Secondary | ICD-10-CM | POA: Diagnosis not present

## 2021-11-15 DIAGNOSIS — Z7901 Long term (current) use of anticoagulants: Secondary | ICD-10-CM | POA: Diagnosis not present

## 2021-11-15 DIAGNOSIS — Z85118 Personal history of other malignant neoplasm of bronchus and lung: Secondary | ICD-10-CM | POA: Insufficient documentation

## 2021-11-15 DIAGNOSIS — N183 Chronic kidney disease, stage 3 unspecified: Secondary | ICD-10-CM | POA: Diagnosis not present

## 2021-11-15 DIAGNOSIS — I714 Abdominal aortic aneurysm, without rupture, unspecified: Secondary | ICD-10-CM | POA: Diagnosis not present

## 2021-11-15 DIAGNOSIS — I6523 Occlusion and stenosis of bilateral carotid arteries: Secondary | ICD-10-CM | POA: Diagnosis not present

## 2021-11-15 DIAGNOSIS — Z902 Acquired absence of lung [part of]: Secondary | ICD-10-CM | POA: Diagnosis not present

## 2021-11-15 DIAGNOSIS — R0602 Shortness of breath: Secondary | ICD-10-CM | POA: Insufficient documentation

## 2021-11-15 DIAGNOSIS — R42 Dizziness and giddiness: Secondary | ICD-10-CM | POA: Diagnosis not present

## 2021-11-15 DIAGNOSIS — E782 Mixed hyperlipidemia: Secondary | ICD-10-CM

## 2021-11-15 DIAGNOSIS — E785 Hyperlipidemia, unspecified: Secondary | ICD-10-CM | POA: Insufficient documentation

## 2021-11-15 DIAGNOSIS — J90 Pleural effusion, not elsewhere classified: Secondary | ICD-10-CM | POA: Insufficient documentation

## 2021-11-15 DIAGNOSIS — Z09 Encounter for follow-up examination after completed treatment for conditions other than malignant neoplasm: Secondary | ICD-10-CM | POA: Diagnosis not present

## 2021-11-15 DIAGNOSIS — Z7984 Long term (current) use of oral hypoglycemic drugs: Secondary | ICD-10-CM | POA: Diagnosis not present

## 2021-11-15 DIAGNOSIS — C3490 Malignant neoplasm of unspecified part of unspecified bronchus or lung: Secondary | ICD-10-CM

## 2021-11-15 DIAGNOSIS — E0590 Thyrotoxicosis, unspecified without thyrotoxic crisis or storm: Secondary | ICD-10-CM | POA: Diagnosis not present

## 2021-11-15 DIAGNOSIS — Z8616 Personal history of COVID-19: Secondary | ICD-10-CM | POA: Diagnosis not present

## 2021-11-15 DIAGNOSIS — I2510 Atherosclerotic heart disease of native coronary artery without angina pectoris: Secondary | ICD-10-CM | POA: Diagnosis not present

## 2021-11-15 DIAGNOSIS — Z87891 Personal history of nicotine dependence: Secondary | ICD-10-CM | POA: Insufficient documentation

## 2021-11-15 DIAGNOSIS — Z8709 Personal history of other diseases of the respiratory system: Secondary | ICD-10-CM

## 2021-11-15 DIAGNOSIS — I5032 Chronic diastolic (congestive) heart failure: Secondary | ICD-10-CM | POA: Diagnosis not present

## 2021-11-15 DIAGNOSIS — I739 Peripheral vascular disease, unspecified: Secondary | ICD-10-CM | POA: Diagnosis not present

## 2021-11-15 LAB — BASIC METABOLIC PANEL
Anion gap: 12 (ref 5–15)
BUN: 24 mg/dL — ABNORMAL HIGH (ref 8–23)
CO2: 22 mmol/L (ref 22–32)
Calcium: 9.3 mg/dL (ref 8.9–10.3)
Chloride: 101 mmol/L (ref 98–111)
Creatinine, Ser: 1.7 mg/dL — ABNORMAL HIGH (ref 0.61–1.24)
GFR, Estimated: 40 mL/min — ABNORMAL LOW (ref >60)
Glucose, Bld: 199 mg/dL — ABNORMAL HIGH (ref 70–99)
Potassium: 4.2 mmol/L (ref 3.5–5.1)
Sodium: 135 mmol/L (ref 135–145)

## 2021-11-15 LAB — BRAIN NATRIURETIC PEPTIDE: B Natriuretic Peptide: 184.4 pg/mL — ABNORMAL HIGH (ref 0.0–100.0)

## 2021-11-15 NOTE — Addendum Note (Signed)
Encounter addended by: Rafael Bihari, FNP on: 11/15/2021 1:33 PM  Actions taken: Clinical Note Signed

## 2021-11-15 NOTE — Patient Instructions (Signed)
Thank you for coming in today  Labs were done today, if any labs are abnormal the clinic will call you  Your physician recommends that you schedule a follow-up appointment in:  8-12 weeks with [name]  At the [facility], you and your health needs are our priority. As part of our continuing mission to provide you with exceptional heart care, we have created designated Provider Care Teams. These Care Teams include your primary Cardiologist (physician) and Advanced Practice Providers (APPs- Physician Assistants and Nurse Practitioners) who all work together to provide you with the care you need, when you need it.   You may see any of the following providers on your designated Care Team at your next follow up: Dr Glori Bickers Dr Haynes Kerns, NP Lyda Jester, [place] [facility], [place] Audry Riles, PharmD   Please be sure to bring in all your medications bottles to every appointment.   If you have any questions or concerns before your next appointment please send [name] a message through [place] or call our office at [phone].    TO LEAVE A MESSAGE FOR THE NURSE SELECT OPTION 2, PLEASE LEAVE A MESSAGE INCLUDING: YOUR NAME DATE OF BIRTH CALL BACK NUMBER REASON FOR CALL**this is important as we prioritize the call backs  YOU WILL RECEIVE A CALL BACK THE SAME DAY AS LONG AS YOU CALL BEFORE 4:00 PM

## 2021-11-15 NOTE — Progress Notes (Signed)
°   ReDS Vest / Clip - 11/15/21 1200       ReDS Vest / Clip   Station Marker C    Ruler Value 34    ReDS Value Range Low volume    ReDS Actual Value 25

## 2021-11-16 ENCOUNTER — Telehealth (HOSPITAL_COMMUNITY): Payer: Self-pay | Admitting: Cardiology

## 2021-11-16 DIAGNOSIS — I5032 Chronic diastolic (congestive) heart failure: Secondary | ICD-10-CM

## 2021-11-16 MED ORDER — FUROSEMIDE 20 MG PO TABS: 60.0000 mg | ORAL_TABLET | Freq: Two times a day (BID) | ORAL | 3 refills | Status: DC

## 2021-11-16 NOTE — Telephone Encounter (Signed)
Patient called.  Patient/pt caregiver aware. Pt will have labs repeated at vascular follow up-orders placed

## 2021-11-16 NOTE — Telephone Encounter (Signed)
-----   Message from Rafael Bihari, [place] sent at 11/15/2021  2:35 PM EST ----- BNP better, SCr elevated. Decrease Lasix back to 60 bid. BMET in 2 weeks

## 2021-11-17 ENCOUNTER — Other Ambulatory Visit (HOSPITAL_COMMUNITY): Payer: Self-pay | Admitting: Cardiology

## 2021-11-20 ENCOUNTER — Ambulatory Visit (INDEPENDENT_AMBULATORY_CARE_PROVIDER_SITE_OTHER): Payer: PPO

## 2021-11-20 DIAGNOSIS — I480 Paroxysmal atrial fibrillation: Secondary | ICD-10-CM | POA: Diagnosis not present

## 2021-11-25 ENCOUNTER — Other Ambulatory Visit: Payer: Self-pay

## 2021-11-25 DIAGNOSIS — I714 Abdominal aortic aneurysm, without rupture, unspecified: Secondary | ICD-10-CM

## 2021-11-25 DIAGNOSIS — I739 Peripheral vascular disease, unspecified: Secondary | ICD-10-CM

## 2021-11-25 DIAGNOSIS — I6523 Occlusion and stenosis of bilateral carotid arteries: Secondary | ICD-10-CM

## 2021-11-27 ENCOUNTER — Telehealth: Payer: Self-pay

## 2021-11-27 ENCOUNTER — Ambulatory Visit (INDEPENDENT_AMBULATORY_CARE_PROVIDER_SITE_OTHER): Payer: PPO

## 2021-11-27 DIAGNOSIS — I5032 Chronic diastolic (congestive) heart failure: Secondary | ICD-10-CM

## 2021-11-27 DIAGNOSIS — E785 Hyperlipidemia, unspecified: Secondary | ICD-10-CM

## 2021-11-27 DIAGNOSIS — I10 Essential (primary) hypertension: Secondary | ICD-10-CM

## 2021-11-27 DIAGNOSIS — I480 Paroxysmal atrial fibrillation: Secondary | ICD-10-CM

## 2021-11-27 NOTE — Telephone Encounter (Signed)
Reaching out regarding eliquis dosing, recent Scr has been elevated at 1.61 mg/dL and 1.7 mg/dL - would recommend considering reduction in eliquis dosing to 2.5mg  BID due to Scr being >1.5 mg/dL and age >82 years of age  Wanted to bring to your attention, I see patient has a repeat BMP ordered, Scr typically has been borderline but just in the last month has increased >1.5  Let me know if you have any questions! Tomasa Blase, PharmD Clinical Pharmacist, [place]

## 2021-11-27 NOTE — Patient Instructions (Signed)
Visit Information  Following are the goals we discussed today:   Manage My Medicine   Timeframe:  Long-Range Goal Priority:  Medium Start Date:         01/20/21                    Expected End Date:  11/27/2022                Follow Up Date 05/28/2022   - call for medicine refill 2 or 3 days before it runs out - call if I am sick and can't take my medicine - keep a list of all the medicines I take; vitamins and herbals too - use a pillbox to sort medicine  -Get Shingrix and Tetanus booster at pharmacy   Why is this important?   These steps will help you keep on track with your medicines.  Plan: Telephone follow up appointment with care management team member scheduled for:  6 months  The patient has been provided with contact information for the care management team and has been advised to call with any health related questions or concerns.   Tomasa Blase, PharmD Clinical Pharmacist, [facility]   Please call the care guide team at [phone] if you need to cancel or reschedule your appointment.   Patient verbalizes understanding of instructions and care plan provided today and agrees to view in [place]. Active MyChart status confirmed with patient.

## 2021-11-27 NOTE — Progress Notes (Signed)
Carelink Summary Report / Loop Recorder 

## 2021-11-27 NOTE — Progress Notes (Signed)
Chronic Care Management Pharmacy Note  11/27/2021 Name:  Troy Pollard [name] MRN:  [id number] DOB:  12/30/1940  Summary: -Patient reports compliance to current medications -Notes that as he has not had any issues with swelling recently lasix has been reduced to $RemoveBe'60mg'lGlVDDPNL$  BID  -Reports he is using breztri inhaler twice daily, does not use albuterol inhaler, but does note to SOB at times  -TG shows noted improvement since starting vascepa  -Scr has remained elevated >1.5 dg/mL on last 2 lab results - due to age >80 years - would be recommended to decrease eliquis to 2.$RemoveBefo'5mg'TxEZsUGFBXy$  BID    Recommendations/Changes made from today's visit: -Advised to get Shingrix and Tetanus booster at pharmacy -Will reach out to cardiology in regards to reduction in eliquis dosing to 2.$RemoveBef'5mg'VFoaAcvcbw$  BID due to age and Scr  -Patient to continue using breztri inhaler twice daily, also to start using albuterol QID prn to help control SOB he has been experiencing    Subjective: Troy Kindred [name] is an 81 y.o. year old male who is a primary patient of Burns, Claudina Lick, MD.  The CCM team was consulted for assistance with disease management and care coordination needs.    Engaged with patient by telephone for follow up visit in response to provider referral for pharmacy case management and/or care coordination services.   Consent to Services:  The patient was given information about Chronic Care Management services, agreed to services, and gave verbal consent prior to initiation of services.  Please see initial visit note for detailed documentation.   Patient Care Team: Binnie Rail, MD as PCP - General (Internal Medicine) Thompson Grayer, MD as PCP - Electrophysiology (Cardiology) Lerry Paterson, MD as Referring Physician (Thoracic Surgery) Larey Dresser, MD as Consulting Physician (Cardiology) Susa Day, MD as Consulting Physician (Orthopedic Surgery) Ladell Pier, MD as Consulting Physician (Oncology) Bond,  Tracie Harrier, MD as Referring Physician (Ophthalmology) Charlton Haws, [facility] as Pharmacist (Pharmacist)   Patient lives at home alone since his wife passed recently. He has several hired caregivers who sit with him most days, and several family members who keep up with him as well. His caregivers set up a monthly pill box for him, combining [employer] pill packs with his OTC items.  Recent office visits: 07/11/2021 - Dr. Quay Burow - no changes to medications f/u in 6 months   Recent consult visits: 10/24/2021 - [name] - Cardiology -increase lasix to $Remove'80mg'RqKhJND$  BID, Kcl increased to 40 qam/ 20qpm - f/u in 3 weeks  10/19/2021 - Dr. Benay Spice - Oncology - clinical remission from colon and lung cancer - follow up CEA - follow up in 6 months  10/13/2021 - Dr. Valeta Harms - Pulmonology - stable, follow up in 6 months  10/10/2021 - Tommye Standard PA-C- Cardiology - no changes to medications - f/u in 1 year  08/21/2021 - [name] - Cardiology - add vasepa 2g BID f/u in 2 months 06/08/2021 - Dr. Cruzita Lederer - TFTs normal no changes - f/u in 1 year   Hospital visits: 07/01/2021-07/05/2021 [facility] admission - dx with COVID 9/26 - SOB / COPD in setting of COVID infection - f/u with PCP in 1 week  06/30/2021 - ED visit for right sided abdominal pain - no changes to medications  06/26/2021 - ED visit - COVID positive - rx'd molnupiravir   Objective:  Lab Results  Component Value Date   CREATININE 1.70 (H) 11/15/2021   BUN 24 (H) 11/15/2021  GFR 41.29 (L) 07/11/2021   GFRNONAA 40 (L) 11/15/2021   GFRAA 74 05/16/2020   NA 135 11/15/2021   K 4.2 11/15/2021   CALCIUM 9.3 11/15/2021   CO2 22 11/15/2021   GLUCOSE 199 (H) 11/15/2021    Lab Results  Component Value Date/Time   HGBA1C 7.0 (H) 07/11/2021 02:55 PM   HGBA1C 5.9 (A) 07/09/2019 10:28 AM   HGBA1C 6.3 (H) 12/03/2018 02:23 PM   GFR 41.29 (L) 07/11/2021 02:55 PM   GFR 54.19 (L) 11/30/2019 03:16 PM   MICROALBUR 0.4 09/29/2007 10:59 AM    Last diabetic  Eye exam: No results found for: HMDIABEYEEXA  Last diabetic Foot exam: No results found for: HMDIABFOOTEX   Lab Results  Component Value Date   CHOL 80 10/24/2021   HDL 31 (L) 10/24/2021   LDLCALC 34 10/24/2021   LDLDIRECT 41.0 07/11/2021   TRIG 74 10/24/2021   CHOLHDL 2.6 10/24/2021    Hepatic Function Latest Ref Rng & Units 07/11/2021 07/05/2021 07/03/2021  Total Protein 6.0 - 8.3 g/dL 6.6 6.6 6.7  Albumin 3.5 - 5.2 g/dL 3.6 - 2.7(L)  AST 0 - 37 U/L 26 - 37  ALT 0 - 53 U/L 47 - 22  Alk Phosphatase 39 - 117 U/L 88 - 81  Total Bilirubin 0.2 - 1.2 mg/dL 0.6 - 0.8  Bilirubin, Direct 0.0 - 0.2 mg/dL - - -    Lab Results  Component Value Date/Time   TSH 2.19 06/08/2021 02:34 PM   TSH 2.05 06/02/2020 03:16 PM   FREET4 0.75 06/08/2021 02:34 PM   FREET4 0.92 06/02/2020 03:16 PM    CBC Latest Ref Rng & Units 07/11/2021 07/03/2021 07/02/2021  WBC 4.0 - 10.5 K/uL 13.6(H) 11.9(H) 13.2(H)  Hemoglobin 13.0 - 17.0 g/dL 13.4 12.0(L) 12.1(L)  Hematocrit 39.0 - 52.0 % 40.2 36.0(L) 36.4(L)  Platelets 150.0 - 400.0 K/uL 436.0(H) 205 193    No results found for: VD25OH  Clinical ASCVD: Yes  The ASCVD Risk score (Arnett DK, et al., 2019) failed to calculate for the following reasons:   The 2019 ASCVD risk score is only valid for ages 1 to 60    Depression screen PHQ 2/9 09/18/2021 08/02/2020  Decreased Interest 0 0  Down, Depressed, Hopeless 0 0  PHQ - 2 Score 0 0  Some recent data might be hidden    Social History   Tobacco Use  Smoking Status Former   Packs/day: 0.50   Years: 50.00   Pack years: 25.00   Types: Cigarettes   Quit date: 07/28/2014   Years since quitting: 7.3  Smokeless Tobacco Never   BP Readings from Last 3 Encounters:  11/15/21 120/60  10/24/21 (!) 144/70  10/19/21 139/60   Pulse Readings from Last 3 Encounters:  11/15/21 67  10/24/21 (!) 59  10/19/21 76   Wt Readings from Last 3 Encounters:  11/15/21 194 lb 12.8 oz (88.4 kg)  10/24/21 198 lb (89.8  kg)  10/19/21 197 lb (89.4 kg)   BMI Readings from Last 3 Encounters:  11/15/21 25.70 kg/m  10/24/21 26.12 kg/m  10/19/21 25.99 kg/m    Assessment/Interventions: Review of patient past medical history, allergies, medications, health status, including review of consultants reports, laboratory and other test data, was performed as part of comprehensive evaluation and provision of chronic care management services.   SDOH:  (Social Determinants of Health) assessments and interventions performed: Yes  SDOH Screenings   Alcohol Screen: Low Risk    Last Alcohol Screening Score (AUDIT):  4  Depression (PHQ2-9): Low Risk    PHQ-2 Score: 0  Financial Resource Strain: Low Risk    Difficulty of Paying Living Expenses: Not hard at all  Food Insecurity: No Food Insecurity   Worried About Charity fundraiser in the Last Year: Never true   Ran Out of Food in the Last Year: Never true  Housing: Low Risk    Last Housing Risk Score: 0  Physical Activity: Sufficiently Active   Days of Exercise per Week: 3 days   Minutes of Exercise per Session: 60 min  Social Connections: Socially Isolated   Frequency of Communication with Friends and Family: More than three times a week   Frequency of Social Gatherings with Friends and Family: Once a week   Attends Religious Services: Never   Marine scientist or Organizations: No   Attends Archivist Meetings: Never   Marital Status: Widowed  Stress: No Stress Concern Present   Feeling of Stress : Not at all  Tobacco Use: Medium Risk   Smoking Tobacco Use: Former   Smokeless Tobacco Use: Never   Passive Exposure: Not on file  Transportation Needs: No Transportation Needs   Lack of Transportation (Medical): No   Lack of Transportation (Non-Medical): No    CCM Care Plan  Allergies  Allergen Reactions   Niacin-Lovastatin Er Shortness Of Breath and Other (See Comments)    Dyspnea, flushing   Penicillins Hives, Shortness Of Breath and  Other (See Comments)    Flushing  & dyspnea Because of a history of documented adverse serious drug reaction;Medi Alert bracelet  is recommended PCN reaction causing immediate rash, facial/tongue/throat swelling, SOB or lightheadedness with hypotension: Yes PCN reaction causing severe rash involving mucus membranes or skin necrosis: No PCN reaction occurring within the last 10 years: NO PCN reaction that required hospitalization: NO   Sulfonamide Derivatives Hives, Shortness Of Breath and Other (See Comments)    Flushing & dyspnea Because of a history of documented adverse serious drug reaction;Medi Alert bracelet  is recommended   Atorvastatin Other (See Comments)    Myalgias & athralgias- takes Crestor, DOES NOT LIKE LIPITOR    Medications Reviewed Today     Reviewed by Tomasa Blase, [facility] (Pharmacist) on 11/27/21 at 1352  Med List Status: <None>   Medication Order Taking? Sig Documenting Provider Last Dose Status Informant  acetaminophen (TYLENOL) 325 MG tablet [id number] Yes Take 650 mg by mouth every 6 (six) hours as needed for moderate pain or headache. [provider] Taking Active Multiple Informants  albuterol (VENTOLIN HFA) 108 (90 Base) MCG/ACT inhaler [id number] Yes Inhale 2 puffs into the lungs every 6 (six) hours as needed for wheezing or shortness of breath. Jonetta Osgood, MD Taking Active   amLODipine (NORVASC) 5 MG tablet [id number] Yes TAKE ONE TABLET ONCE DAILY, Larey Dresser, MD Taking Active   apixaban (ELIQUIS) 5 MG TABS tablet [id number] Yes Take 1 tablet (5 mg total) by mouth 2 (two) times daily. Baldwin [place], PA-C Taking Active   Brinzolamide-Brimonidine [facility]) 1-0.2 % SUSP [id number] Yes Place 1 drop into both eyes 2 times daily. [provider] Taking Active Multiple Informants  Budeson-Glycopyrrol-Formoterol (BREZTRI AEROSPHERE) 160-9-4.8 MCG/ACT AERO [id number] Yes Inhale 2 puffs into the lungs in the morning and at bedtime. Garner Nash, DO Taking Active   docusate sodium (COLACE) 100 MG capsule [id number] Yes Take 100 mg by mouth 2 (two) times daily.  [provider] Taking Active Multiple  Informants  empagliflozin (JARDIANCE) 10 MG TABS tablet [id number] Yes Take 1 tablet (10 mg total) by mouth daily before breakfast. Larey Dresser, MD Taking Active Multiple Informants  escitalopram (LEXAPRO) 10 MG tablet [id number] Yes TAKE ONE TABLET BY MOUTH EVERY DAY Burns, Claudina Lick, MD Taking Active   furosemide (LASIX) 20 MG tablet [id number] Yes Take 3 tablets (60 mg total) by mouth 2 (two) times daily. Erlanger, [place], FNP Taking Active   latanoprost (XALATAN) 0.005 % ophthalmic solution [id number] Yes Place 1 drop into both eyes at bedtime. [provider] Taking Active Multiple Informants  losartan (COZAAR) 25 MG tablet [id number] Yes TAKE ONE TABLET BY MOUTH EVERY DAY Larey Dresser, MD Taking Active   Melatonin 10 MG TABS [id number] Yes Take 2 tablets by mouth at bedtime. [provider] Taking Active   metoprolol succinate (TOPROL-XL) 25 MG 24 hr tablet [id number] Yes TAKE 1/2 TABLET IN THE EVENING WITH OR IMMEDIATELY FOLLOWING A MEAL Larey Dresser, MD Taking Active Family Member  montelukast (SINGULAIR) 10 MG tablet [id number] Yes TAKE ONE TABLET AT BEDTIME Binnie Rail, MD Taking Active   Multiple Vitamins-Minerals (CENTRUM SILVER PO) [id number] Yes Take 1 tablet by mouth daily. [provider] Taking Active Multiple Informants  omeprazole (PRILOSEC) 20 MG capsule [id number] Yes TAKE (1) CAPSULE TWICE DAILY. Mansouraty, Telford Nab., MD Taking Active   potassium chloride SA (KLOR-CON M) 20 MEQ tablet [id number] Yes Take 2 tablets (40 mEq total) by mouth every morning AND 1 tablet (20 mEq total) every evening. Larey Dresser, MD Taking Active   rosuvastatin (CRESTOR) 40 MG tablet [id number] Yes TAKE ONE TABLET ONCE DAILY Larey Dresser, MD Taking Active   senna (SENOKOT) 8.6 MG tablet  [id number] Yes Take 1 tablet by mouth 2 (two) times daily. [provider] Taking Active Multiple Informants  VASCEPA 1 g capsule [id number] Yes Take 2 capsules (2 g total) by mouth 2 (two) times daily. Larey Dresser, MD Taking Active             Patient Active Problem List   Diagnosis Date Noted   Acute respiratory failure with hypoxia ([place]) 07/02/2021   SOB (shortness of breath) 07/02/2021   AKI (acute kidney injury) ([place]) 07/02/2021   Hypomagnesemia 07/02/2021   COPD with acute exacerbation ([place]) 07/02/2021   Hypokalemia 07/02/2021   Elevated troponin 07/02/2021   Acute hypoxemic respiratory failure due to COVID-19 ([place]) 07/02/2021   Hyperlipidemia    COVID-19 virus infection 07/01/2021   Chronic anticoagulation 10/18/2020   Hx of adenomatous colonic polyps 10/18/2020   History of gastric polyps 10/18/2020   Iron deficiency 10/18/2020   History of anemia 10/18/2020   Gastric intestinal metaplasia 10/18/2020   Aortic atherosclerosis ([place]) 08/01/2020   Secondary hypercoagulable state ([place]) 03/22/2020   Personal history of colon cancer 01/08/2020   Epigastric pain 01/08/2020   Abnormal findings on esophagogastroduodenoscopy (EGD) 01/08/2020   Calculus of gallbladder without cholecystitis without obstruction 01/08/2020   Vertigo 11/25/2019   Cancer of sigmoid colon ([place]) 12/12/2018   Pleural effusion on right 07/15/2018   Colon adenocarcinoma ([place]) 06/30/2018   Acute GI bleeding 06/23/2018   Depression 05/06/2018   Syncope 01/29/2018   Chronic diastolic CHF (congestive heart failure) ([place]) 05/04/2017   CAD (coronary artery disease) 05/03/2017   MGUS (monoclonal gammopathy of unknown significance) 03/07/2016   Benign essential HTN 01/19/2016   OSA (obstructive sleep apnea) 08/29/2015   PAF (paroxysmal atrial fibrillation) ([place])    Glaucoma 06/22/2015  DOE (dyspnea on exertion) 02/04/2015   Memory loss, short term 08/03/2014   Occlusion and stenosis of carotid  artery without mention of cerebral infarction 07/09/2013   COPD GOLD II  07/09/2013   Chronic renal disease, stage 3, moderately decreased glomerular filtration rate between 30-59 mL/min/1.73 square meter (HCC) 07/06/2013   TIA (transient ischemic attack) 07/06/2013   Basal cell carcinoma of skin 10/10/2012   History of lung cancer 03/11/2012   PAD (peripheral artery disease) ([place]) 12/16/2011   AAA (abdominal aortic aneurysm) ([place]) 09/27/2011   Squamous cell lung cancer ([place]) 11/08/2010   GAIT IMBALANCE 03/02/2009   Prediabetes 03/02/2009   HYPERPLASIA PROSTATE UNS W/O UR OBST & OTH LUTS 01/15/2008   HYPERLIPIDEMIA 09/29/2007   Peripheral vascular disease ([place]) 09/29/2007   LEUKOPLAKIA, VOCAL CORDS 09/29/2007   FEMORAL BRUIT 09/29/2007    Immunization History  Administered Date(s) Administered   Fluad Quad(high Dose 65+) 08/11/2020   Influenza Split 07/05/2017   Influenza Whole 07/28/2010   Influenza,inj,Quad PF,6+ Mos 07/07/2013, 07/30/2014   Influenza-Unspecified 07/02/2015, 07/01/2021   PFIZER(Purple Top)SARS-COV-2 Vaccination 10/18/2019, 11/08/2019, 06/14/2020, 04/14/2021   Pneumococcal Conjugate-13 11/28/2015   Pneumococcal Polysaccharide-23 10/02/2007   Td 03/14/2010    Conditions to be addressed/monitored:  Hypertension, Hyperlipidemia, Atrial Fibrillation, Heart Failure, Coronary Artery Disease, GERD, COPD, Chronic Kidney Disease and Depression  Care Plan : CCM Pharmacy Care Plan  Updates made by Tomasa Blase, RPH since 11/27/2021 12:00 AM     Problem: Hypertension, Hyperlipidemia, Atrial Fibrillation, Heart Failure, Coronary Artery Disease, CKD   Priority: High     Long-Range Goal: Disease management   Start Date: 01/20/2021  Expected End Date: 11/27/2022  This Visit's Progress: On track  Recent Progress: On track  Priority: High  Note:   Current Barriers:  Unable to independently monitor therapeutic efficacy Unable to maintain control of  constipation  Pharmacist Clinical Goal(s):  Patient will achieve adherence to monitoring guidelines and medication adherence to achieve therapeutic efficacy through collaboration with PharmD and provider.   Interventions: 1:1 collaboration with Binnie Rail, MD regarding development and update of comprehensive plan of care as evidenced by provider attestation and co-signature Inter-disciplinary care team collaboration (see longitudinal plan of care) Comprehensive medication review performed; medication list updated in electronic medical record  Hyperlipidemia: (LDL goal < 70) -Hx TIA, PAD, CAD (aortic atherosclerosis) Lab Results  Component Value Date   LDLCALC 34 10/24/2021  -Controlled - LDL is at goal, pt denies side effects -Current treatment: Rosuvastatin 40 mg daily Vascepa 2g BID  -Educated on Cholesterol goals;  Benefits of statin for ASCVD risk reduction; -Recommended to continue current medication  Atrial Fibrillation (Goal: prevent stroke and major bleeding) -Controlled - pt reports Afib episodes are rare since ablation; he denies bleeding issues -CHADSVASC: 6 -s/p ablation 04/2019 and redo 01/2020 -hx GI bleed 06/2018 related to colon cancer -Current treatment: Rate control: Metoprolol succinate 25 mg - 1/2 tab daily Anticoagulation: Eliquis 5 mg BID -Home BP and HR readings: not checking  -Counseled on increased risk of stroke due to Afib and benefits of anticoagulation for stroke prevention; importance of adherence to anticoagulant exactly as prescribed; bleeding risk associated with Eliquis and importance of self-monitoring for signs/symptoms of bleeding; -Recommended to continue current medication-Due to recently elevated Scr at - most recently 1.7 - would recommend eliquis be decreased to 2.$RemoveBefore'5mg'ZTDSRssgJxtXA$  BID due to Scr and Age  - message sent to cardiology to consider dosage adjustment   Heart Failure / HTN (Goal: BP < 140/90, prevent  exacerbations) -Relatively controlled  - pt reports frequent urination on furosemide, but swelling and shortness of breath are stable now -Last ejection fraction: 60-65% (Date: 01/13/2020) -HF type: Diastolic -NYHA Class: III (marked limitation of activity) -Current treatment: Amlodipine 5 mg daily Furosemide $RemoveBeforeDE'60mg'OpLjrpCQsJrCrJZ$  BID  Losartan 25 mg daily Metoprolol succinate 25 mg - 1/2 tab daily Jardiance 10 mg daily Klor-Con 79mEq in AM / 20 mEq in PM -Recommended to continue current medication  Health Maintenance -Vaccine gaps: TDAP, Shingrix -Advised to get TD booster and Shingrix at pharmacy  Patient Goals/Self-Care Activities Patient will:  - take medications as prescribed -Focus on medication adherence by pill box -weigh daily, and contact provider if weight gain of 3+ lbs overnight -engage in dietary modifications by minimizing salt        Medication Assistance: None required.  Patient affirms current coverage meets needs.  Care Gaps: Shingrix TDAP  COVID booster   Patient's preferred pharmacy is:  [facility], [address] [place] [address] Phone: [phone] Fax: [facility] [place] [address] [place] [address] Phone: [phone] Fax: [phone]   Uses pill box? Yes Pt endorses 100% compliance His caregivers set up a monthly pill box for him, combining [employer] pill packs with his OTC items.  We discussed: Current pharmacy is preferred with insurance plan and patient is satisfied with pharmacy services Patient decided to: Continue current medication management strategy  Care Plan and Follow Up Patient Decision:  Patient agrees to Care Plan and Follow-up.  Plan: Telephone follow up appointment with care management team member scheduled for:  6 months  Tomasa Blase, PharmD Clinical Pharmacist, [place]

## 2021-11-28 DIAGNOSIS — I5032 Chronic diastolic (congestive) heart failure: Secondary | ICD-10-CM | POA: Diagnosis not present

## 2021-11-28 DIAGNOSIS — I10 Essential (primary) hypertension: Secondary | ICD-10-CM

## 2021-11-28 DIAGNOSIS — I480 Paroxysmal atrial fibrillation: Secondary | ICD-10-CM

## 2021-11-28 DIAGNOSIS — E785 Hyperlipidemia, unspecified: Secondary | ICD-10-CM

## 2021-11-29 DIAGNOSIS — N1832 Chronic kidney disease, stage 3b: Secondary | ICD-10-CM | POA: Diagnosis not present

## 2021-11-29 DIAGNOSIS — Z7901 Long term (current) use of anticoagulants: Secondary | ICD-10-CM | POA: Diagnosis not present

## 2021-11-29 DIAGNOSIS — I480 Paroxysmal atrial fibrillation: Secondary | ICD-10-CM | POA: Diagnosis not present

## 2021-11-30 ENCOUNTER — Ambulatory Visit (HOSPITAL_COMMUNITY)
Admission: RE | Admit: 2021-11-30 | Discharge: 2021-11-30 | Disposition: A | Discharge status: Home / Self Care | Payer: PPO | Source: Ambulatory Visit | Attending: Vascular Surgery | Admitting: Vascular Surgery

## 2021-11-30 ENCOUNTER — Ambulatory Visit (INDEPENDENT_AMBULATORY_CARE_PROVIDER_SITE_OTHER)
Admission: RE | Admit: 2021-11-30 | Discharge: 2021-11-30 | Disposition: A | Discharge status: Home / Self Care | Payer: PPO | Source: Ambulatory Visit | Attending: Vascular Surgery | Admitting: Vascular Surgery

## 2021-11-30 ENCOUNTER — Ambulatory Visit: Payer: PPO

## 2021-11-30 ENCOUNTER — Other Ambulatory Visit: Payer: Self-pay

## 2021-11-30 DIAGNOSIS — I739 Peripheral vascular disease, unspecified: Secondary | ICD-10-CM | POA: Insufficient documentation

## 2021-11-30 DIAGNOSIS — I6523 Occlusion and stenosis of bilateral carotid arteries: Secondary | ICD-10-CM | POA: Insufficient documentation

## 2021-11-30 DIAGNOSIS — I714 Abdominal aortic aneurysm, without rupture, unspecified: Secondary | ICD-10-CM | POA: Diagnosis not present

## 2021-12-05 NOTE — Progress Notes (Signed)
HISTORY AND PHYSICAL     CC:  follow up. Requesting Provider:  Binnie Rail, MD  HPI: This is a 80 y.o. male who is here today for follow up and is pt of Dr. [name].  He is s/p left fem-pop bypass graft in [place] in 1994. Dr. [name] revised this in 2001. He had angioplasty of this in 2016. His bypass has been patent without any recurrent stenosis on surveillance.  He has hx of right CEA in October 2014 also by Dr. [name].  He has hx of AAA.   Patient did have robotic anterior resection of his sigmoid colon on 12/12/18 for colon cancer. He did well following this.   He also had an ablation for his atrial fibrillation in April 2020.  Pt was last seen on 11/18/2019.  At that time, He had been having some LLE pain that had been present for a couple of months.  It began in his lower back and radiated to his left thigh to his knee and lower leg.  He was not having pain in his foot.  He had been seeing a chiropractor and it was improving.  He was not having any stroke sx.   The pt returns today for follow up.  He states that he is doing well with his walking.  He states that he has a pretty long driveway and he walks up and back.  He does have some cramping in the right thigh but if he stops and stretches, this resolves and he continues on.  He is also limited by shortness of breath with exertion.  He denies and rest pain or non healing wounds.  He has had some achy back pain that comes and goes but denies any sudden sharp abdominal or back pain.    Pt denies any amaurosis fugax, speech difficulties, weakness, numbness, paralysis or clumsiness or facial droop.    He states he lives alone.  His wife of 84 years past away 3 years ago.    The pt is on a statin for cholesterol management.    The pt is not on an aspirin.    Other AC:  Eliquis The pt is on CCB, ARB, BB for hypertension.  The pt does have diabetes. Tobacco hx:  former  Pt does not have family hx of AAA.    Past Medical History:   Diagnosis Date   AAA (abdominal aortic aneurysm) LAST ABDOMINAL US 10-20-17 3.3 CM   MONITORED BY DR [name]   Allergy    Anxiety    Arthritis    Atrial fibrillation (HCC)    Basal cell carcinoma    Bilateral carotid artery stenosis DUPLEX 12-29-2012  BY DR [facility]   BILATERAL ICA STENOSIS 60-79%   BPH (benign prostatic hypertrophy)    Cataract    removed both eyes   CHF (congestive heart failure) (HCC)    Chronic diastolic heart failure (HCC)    Chronic kidney disease    stage 3, pt unaware   Colon cancer (HCC)    Complication of anesthesia    had nausea after carotid artery surgery, states the medicine given to help the nausea, made it worse   Constipation    COPD (chronic obstructive pulmonary disease) (HCC)    Depression    Emphysema of lung (HCC)    GERD (gastroesophageal reflux disease)    Glaucoma BOTH EYES   Dr Gershon Crane   History of basal cell carcinoma excision    History of lung cancer APRIL  2012  SQUAMOUS CELL---- S/P RIGHT LOWER LOBECTOMY AT [facility] --  NO CHEMORADIATION---  NO RECURRENCE    ONCOLOGIST- DR Tressie Stalker  LOV IN [facility] 10-27-2012   History of pneumothorax    pt unaware   Hx of adenomatous colonic polyps 2005    X 2; 1 hyperplastic polyp; Dr Olevia Perches   Hyperlipidemia    Hypertension    Impaired fasting glucose 2007   108; A1c5.4%   Lesion of bladder    Lung cancer ([place]) 01/25/2012   Microhematuria    OSA (obstructive sleep apnea) 08/29/2015   CPAP SET ON 10   PAD (peripheral artery disease) ([place]) ABI'S  JAN 2014  0.65 ON RIGHT ;  1.04 ON LEFT   Peripheral vascular disease (HCC) S/P ANGIOPLASTY AND STENTING   FOLLOWED  BY DR [name]   Pleural effusion on right 11/18/2018   Chronic, noted on CXR   Sleep apnea    + cpap    Spinal stenosis Sept. 2015   Status post placement of implantable loop recorder    Stroke ([place]) Jul 07, 2013   mini  TIA   Thoracic aorta atherosclerosis (HCC)    Thyrotoxicosis    amiodarone induced   Urgency of urination      Past Surgical History:  Procedure Laterality Date   ANGIO PLASTY     X 4 in legs   AORTOGRAM  07-27-2002   MILD DIFFUSE ILIAC ARTERY OCCLUSIVE DISEASE /  LEFT RENAL ARTERY 20%/ PATENT LEFT FEM-POP GRAFT/ MILD SFA AND POPLITEAL ARTERY OCCLUSIVE DISEASE W/ SEVERE KIDNEY OCCLUSIVE DISEASE   ATRIAL FIBRILLATION ABLATION N/A 04/09/2019   Procedure: ATRIAL FIBRILLATION ABLATION;  Surgeon: Thompson Grayer, MD;  Location: [place] CV [facility];  Service: Cardiovascular;  Laterality: N/A;   ATRIAL FIBRILLATION ABLATION N/A 02/12/2020   Procedure: ATRIAL FIBRILLATION ABLATION;  Surgeon: Thompson Grayer, MD;  Location: [place] CV LAB;  Service: Cardiovascular;  Laterality: N/A;   BASAL CELL CARCINOMA EXCISION     MULTIPLE TIMES--  RIGHT FOREARM, CHEEKS, AND BACK   BIOPSY  06/25/2018   Procedure: BIOPSY;  Surgeon: Irving Copas., MD;  Location: [place];  Service: Gastroenterology;;   CARDIOVASCULAR STRESS TEST  12-08-2012  [facility]   NORMAL LEXISCAN WITH NO EXERCISE NUCLEAR STUDY/ EF 66%/   NO ISCHEMIA/ NO SIGNIFICANT CHANGE FROM PRIOR STUDY   CARDIOVERSION N/A 02/14/2018   Procedure: CARDIOVERSION;  Surgeon: Larey Dresser, MD;  Location: [place];  Service: Cardiovascular;  Laterality: N/A;   CAROTID ANGIOGRAM N/A 07/10/2013   Procedure: CAROTID ANGIOGRAM;  Surgeon: Elam Dutch, MD;  Location: [facility] CATH LAB;  Service: Cardiovascular;  Laterality: N/A;   CAROTID ENDARTERECTOMY Right 07-14-13   cea   CATARACT EXTRACTION W/ INTRAOCULAR LENS  IMPLANT, BILATERAL     colon polyectomy     COLONOSCOPY     COLONOSCOPY WITH PROPOFOL N/A 06/25/2018   Procedure: COLONOSCOPY WITH PROPOFOL;  Surgeon: Rush Landmark Telford Nab., MD;  Location: [place];  Service: Gastroenterology;  Laterality: N/A;   CYSTOSCOPY W/ RETROGRADES Bilateral 01/21/2013   Procedure: CYSTOSCOPY WITH RETROGRADE PYELOGRAM;  Surgeon: Molli Hazard, MD;  Location[facility];  Service: Urology;   Laterality: Bilateral;   CYSTO, BLADDER BIOPSY, BILATERAL RETROGRADE PYELOGRAM  RAD TECH FROM RADIOLOGY PER JOY   CYSTOSCOPY WITH BIOPSY N/A 01/21/2013   Procedure: CYSTOSCOPY WITH BIOPSY;  Surgeon: Molli Hazard, MD;  Location: [facility];  Service: Urology;  Laterality: N/A;   ENDARTERECTOMY Right 07/14/2013  Procedure: ENDARTERECTOMY CAROTID;  Surgeon: Angelia Mould, MD;  Location: [place];  Service: Vascular;  Laterality: Right;   EP IMPLANTABLE DEVICE N/A 08/12/2015   Procedure: Loop Recorder Insertion;  Surgeon: Thompson Grayer, MD;  Location: [place] CV LAB;  Service: Cardiovascular;  Laterality: N/A;   ESOPHAGOGASTRODUODENOSCOPY (EGD) WITH PROPOFOL N/A 06/25/2018   Procedure: ESOPHAGOGASTRODUODENOSCOPY (EGD) WITH PROPOFOL;  Surgeon: Rush Landmark Telford Nab., MD;  Location: [place];  Service: Gastroenterology;  Laterality: N/A;   ESOPHAGOGASTRODUODENOSCOPY (EGD) WITH PROPOFOL N/A 07/10/2018   Procedure: ESOPHAGOGASTRODUODENOSCOPY (EGD) WITH PROPOFOL;  Surgeon: Milus Banister, MD;  Location: [facility];  Service: Endoscopy;  Laterality: N/A;   EUS N/A 07/10/2018   Procedure: UPPER ENDOSCOPIC ULTRASOUND (EUS) RADIAL;  Surgeon: Milus Banister, MD;  Location: [facility];  Service: Endoscopy;  Laterality: N/A;   EYE SURGERY Right    FEMORAL-POPLITEAL BYPASS GRAFT Left [place]   AND 2001  IN [place]   FEMORAL-POPLITEAL BYPASS GRAFT Left 08/30/2015   Procedure: REVISION OF BYPASS GRAFT Left  FEMORAL-POPLITEAL ARTERY;  Surgeon: Angelia Mould, MD;  Location: [place];  Service: Vascular;  Laterality: Left;   FINE NEEDLE ASPIRATION N/A 07/10/2018   Procedure: FINE NEEDLE ASPIRATION (FNA) LINEAR;  Surgeon: Milus Banister, MD;  Location: [facility];  Service: Endoscopy;  Laterality: N/A;   FLEXIBLE SIGMOIDOSCOPY N/A 12/12/2018   Procedure: FLEXIBLE SIGMOIDOSCOPY;  Surgeon: Ileana Roup, MD;  Location: [facility];  Service: General;   Laterality: N/A;   LARYNGOSCOPY  06-27-2004   BX VOCAL CORD  (LEUKOPLAKIA)  PER PT NO ISSUES SINCE   LEFT HEART CATH AND CORONARY ANGIOGRAPHY N/A 08/20/2017   Procedure: LEFT HEART CATH AND CORONARY ANGIOGRAPHY;  Surgeon: Larey Dresser, MD;  Location: [place] CV LAB;  Service: Cardiovascular;  Laterality: N/A;   LOOP RECORDER INSERTION N/A 04/09/2019   Procedure: LOOP RECORDER INSERTION;  Surgeon: Thompson Grayer, MD;  Location: [place] CV LAB;  Service: Cardiovascular;  Laterality: N/A;   LOOP RECORDER REMOVAL N/A 04/09/2019   Procedure: LOOP RECORDER REMOVAL;  Surgeon: Thompson Grayer, MD;  Location: [place] CV LAB;  Service: Cardiovascular;  Laterality: N/A;   LOWER EXTREMITY ANGIOGRAM Bilateral 08/29/2015   Procedure: Lower Extremity Angiogram;  Surgeon: Angelia Mould, MD;  Location: [place] CV LAB;  Service: Cardiovascular;  Laterality: Bilateral;   LUNG LOBECTOMY  01/24/2011    RIGHT UPPER LOBE  (SQUAMOUS CELL CARCINOMA) Dr Dorthea Cove , [name]. No chemotherapyor radiation   PATCH ANGIOPLASTY Right 07/14/2013   Procedure: PATCH ANGIOPLASTY;  Surgeon: Angelia Mould, MD;  Location: [place];  Service: Vascular;  Laterality: Right;   PATCH ANGIOPLASTY Left 08/30/2015   Procedure: VEIN PATCH ANGIOPLASTY OF PROXIMAL Left BYPASS GRAFT;  Surgeon: Angelia Mould, MD;  Location: [place];  Service: Vascular;  Laterality: Left;   PERIPHERAL VASCULAR CATHETERIZATION N/A 08/29/2015   Procedure: Abdominal Aortogram;  Surgeon: Angelia Mould, MD;  Location: [place] CV LAB;  Service: Cardiovascular;  Laterality: N/A;   POLYPECTOMY  06/25/2018   Procedure: POLYPECTOMY;  Surgeon: Rush Landmark Telford Nab., MD;  Location: [place];  Service: Gastroenterology;;   POLYPECTOMY     SUBMUCOSAL INJECTION  06/25/2018   Procedure: SUBMUCOSAL INJECTION;  Surgeon: Irving Copas., MD;  Location: [place];  Service: Gastroenterology;;   TEE WITHOUT CARDIOVERSION N/A  02/14/2018   Procedure: TRANSESOPHAGEAL ECHOCARDIOGRAM (TEE);  Surgeon: Larey Dresser, MD;  Location: [name] ENDOSCOPY;  Service: Cardiovascular;  Laterality: N/A;   trabecular surgery  OS   TRANSTHORACIC ECHOCARDIOGRAM  12-29-2012  DR [facility]   MILD LVH/  LVSF NORMAL/ EF 54-65%/  GRADE I DIASTOLIC DYSFUNCTION    Allergies  Allergen Reactions   Niacin-Lovastatin Er Shortness Of Breath and Other (See Comments)    Dyspnea, flushing   Penicillins Hives, Shortness Of Breath and Other (See Comments)    Flushing  & dyspnea Because of a history of documented adverse serious drug reaction;Medi Alert bracelet  is recommended PCN reaction causing immediate rash, facial/tongue/throat swelling, SOB or lightheadedness with hypotension: Yes PCN reaction causing severe rash involving mucus membranes or skin necrosis: No PCN reaction occurring within the last 10 years: NO PCN reaction that required hospitalization: NO   Sulfonamide Derivatives Hives, Shortness Of Breath and Other (See Comments)    Flushing & dyspnea Because of a history of documented adverse serious drug reaction;Medi Alert bracelet  is recommended   Atorvastatin Other (See Comments)    Myalgias & athralgias- takes Crestor, DOES NOT LIKE LIPITOR    Current Outpatient Medications  Medication Sig Dispense Refill   acetaminophen (TYLENOL) 325 MG tablet Take 650 mg by mouth every 6 (six) hours as needed for moderate pain or headache.     albuterol (VENTOLIN HFA) 108 (90 Base) MCG/ACT inhaler Inhale 2 puffs into the lungs every 6 (six) hours as needed for wheezing or shortness of breath. 8.5 g 0   amLODipine (NORVASC) 5 MG tablet TAKE ONE TABLET ONCE DAILY, 60 tablet 2   apixaban (ELIQUIS) 5 MG TABS tablet Take 1 tablet (5 mg total) by mouth 2 (two) times daily. 60 tablet 6   Brinzolamide-Brimonidine (SIMBRINZA) 1-0.2 % SUSP Place 1 drop into both eyes 2 times daily.     Budeson-Glycopyrrol-Formoterol (BREZTRI AEROSPHERE) 160-9-4.8  MCG/ACT AERO Inhale 2 puffs into the lungs in the morning and at bedtime. 10.7 g 0   docusate sodium (COLACE) 100 MG capsule Take 100 mg by mouth 2 (two) times daily.      empagliflozin (JARDIANCE) 10 MG TABS tablet Take 1 tablet (10 mg total) by mouth daily before breakfast. 30 tablet 11   escitalopram (LEXAPRO) 10 MG tablet TAKE ONE TABLET BY MOUTH EVERY DAY 28 tablet 5   furosemide (LASIX) 20 MG tablet Take 3 tablets (60 mg total) by mouth 2 (two) times daily. 180 tablet 3   latanoprost (XALATAN) 0.005 % ophthalmic solution Place 1 drop into both eyes at bedtime.     losartan (COZAAR) 25 MG tablet TAKE ONE TABLET BY MOUTH EVERY DAY 90 tablet 3   Melatonin 10 MG TABS Take 2 tablets by mouth at bedtime.     metoprolol succinate (TOPROL-XL) 25 MG 24 hr tablet TAKE 1/2 TABLET IN THE EVENING WITH OR IMMEDIATELY FOLLOWING A MEAL 30 tablet 3   montelukast (SINGULAIR) 10 MG tablet TAKE ONE TABLET AT BEDTIME 28 tablet 5   Multiple Vitamins-Minerals (CENTRUM SILVER PO) Take 1 tablet by mouth daily.     omeprazole (PRILOSEC) 20 MG capsule TAKE (1) CAPSULE TWICE DAILY. 56 capsule 3   potassium chloride SA (KLOR-CON M) 20 MEQ tablet Take 2 tablets (40 mEq total) by mouth every morning AND 1 tablet (20 mEq total) every evening. 90 tablet 6   rosuvastatin (CRESTOR) 40 MG tablet TAKE ONE TABLET ONCE DAILY 60 tablet 3   senna (SENOKOT) 8.6 MG tablet Take 1 tablet by mouth 2 (two) times daily.     VASCEPA 1 g capsule Take 2 capsules (2 g total) by mouth 2 (two) times  daily. 120 capsule 11   No current facility-administered medications for this visit.    Family History  Problem Relation Age of Onset   Stroke Mother        mini strokes   Alcohol abuse Father    Heart disease Father        MI after 52   Stroke Father    Hypertension Father    Heart attack Father    Heart disease Paternal Aunt        several   Hypertension Paternal Aunt        several   Stroke Paternal Aunt        several   Stroke  Paternal Uncle        several   Heart disease Paternal Uncle        several;2 had MI pre 72   Cancer Daughter 82       breast ca, also with benign sessile polyp    Colon cancer Daughter 96   Colon polyps Neg Hx    Esophageal cancer Neg Hx    Rectal cancer Neg Hx    Stomach cancer Neg Hx     Social History   Socioeconomic History   Marital status: Married    Spouse name: [name]    Number of children: 3   Years of education: 12+   Highest education level: Not on file  Occupational History   Occupation: Retired    Comment: Owns a [employer], Advertising account executive, 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  Tobacco Use   Smoking status: Former    Packs/day: 0.50    Years: 50.00    Pack years: 25.00    Types: Cigarettes    Quit date: 07/28/2014    Years since quitting: 7.3   Smokeless tobacco: Never  Vaping Use   Vaping Use: Never used  Substance and Sexual Activity   Alcohol use: Yes    Alcohol/week: 4.0 standard drinks    Types: 1 Glasses of wine, 1 Cans of beer, 1 Shots of liquor, 1 Standard drinks or equivalent per week    Comment:  socially, variable   Drug use: No   Sexual activity: Not on file  Other Topics Concern   Not on file  Social History Narrative   Patient lives at home with spouse [name]   Patient has 3 children    Patient is right handed    Social Determinants of Health   Financial Resource Strain: Low Risk    Difficulty of Paying Living Expenses: Not hard at all  Food Insecurity: No Food Insecurity   Worried About Charity fundraiser in the Last Year: Never true   Arboriculturist in the Last Year: Never true  Transportation Needs: No Transportation Needs   Lack of Transportation (Medical): No   Lack of Transportation (Non-Medical): No  Physical Activity: Sufficiently Active   Days of Exercise per Week: 3 days   Minutes of Exercise per Session: 60 min  Stress: No Stress Concern Present   Feeling of Stress  : Not at all  Social Connections: Socially Isolated   Frequency of Communication with Friends and Family: More than three times a week   Frequency of Social Gatherings with Friends and Family: Once a week   Attends Religious Services: Never   Marine scientist or Organizations: No   Attends Archivist Meetings: Never   Marital Status: Widowed  Intimate Partner Violence: Not At Risk   Fear of Current or Ex-Partner: No   Emotionally Abused: No   Physically Abused: No   Sexually Abused: No     REVIEW OF SYSTEMS:   [X]  denotes positive finding, [ ]  denotes negative finding Cardiac  Comments:  Chest pain or chest pressure:    Shortness of breath upon exertion:    Short of breath when lying flat:    Irregular heart rhythm:        Vascular    Pain in calf, thigh, or hip brought on by ambulation:    Pain in feet at night that wakes you up from your sleep:     Blood clot in your veins:    Leg swelling:         Pulmonary    Oxygen at home:    Wheezing:         Neurologic    Sudden weakness in arms or legs:     Sudden numbness in arms or legs:     Sudden onset of difficulty speaking or understanding others    Temporary loss of vision in one eye:     Problems with dizziness:         Gastrointestinal    Blood in stool:     Vomited blood:         Genitourinary    Burning when urinating:     Blood in urine:        Psychiatric    Major depression:         Hematologic    Bleeding problems:    Problems with blood clotting too easily:        Skin    Rashes or ulcers:        Constitutional    Fever or chills:      PHYSICAL EXAMINATION:  Today's Vitals   12/07/21 1233 12/07/21 1236  BP: (!) 172/73 (!) 145/63  Pulse: 64 65  Temp: (!) 96.4 F (35.8 C)   Weight: 193 lb 12.8 oz (87.9 kg)   Height: 6' (1.829 m)    Body mass index is 26.28 kg/m.   General:  WDWN in NAD; vital signs documented above Gait: shuffled gait HENT: WNL,  normocephalic Pulmonary: normal non-labored breathing  Cardiac: regular HR;  without carotid bruits Abdomen: soft, NT, aortic pulse is not palpable Skin: without rashes Vascular Exam/Pulses:  Right Left  Radial 2+ (normal) 2+ (normal)  Femoral 1+ (weak) 2+ (normal)  Popliteal Unable to palpate Unable to palpate  DP Brisk monophasic doppler Brisk monophasic doppler  PT absent Brisk monophasic doppler  Peroneal Brisk monophasic doppler Brisk biphasic doppler   Extremities: without ischemic changes, without Gangrene , without cellulitis; without open wounds;  Musculoskeletal: no muscle wasting or atrophy  Neurologic: A&O X 3;  speech is fluent/normal; moving all extremities equally  Psychiatric:  The pt has Normal affect.   Non-Invasive Vascular Imaging:   ABI's/TBI's on 11/30/2021: Right:  0.58/0.57 - great toe pressure:  75 Left:  1.11/0.96 - great toe pressure:  127  LLE  Arterial duplex on 11/30/2021: Left Graft #1: Femoral-popliteal  +--------------------+--------+--------+--------+--------+                        PSV cm/s Stenosis Waveform Comments   +--------------------+--------+--------+--------+--------+   Inflow               160  biphasic            +--------------------+--------+--------+--------+--------+   Proximal Anastomosis 168               biphasic            +--------------------+--------+--------+--------+--------+   Proximal Graft       100               biphasic            +--------------------+--------+--------+--------+--------+   Mid Graft            98                biphasic            +--------------------+--------+--------+--------+--------+   Distal Graft         73                biphasic            +--------------------+--------+--------+--------+--------+   Distal Anastomosis   124               biphasic            +--------------------+--------+--------+--------+--------+   Outflow              82                biphasic             +--------------------+--------+--------+--------+--------+   Summary:  Left: Patent left femoral to below-knee popliteal bypass graft with no evidence of significant stenosis  AAA duplex on 11/30/2021: Abdominal Aorta Findings:  +-----------+-------+----------+----------+--------+--------+--------+   Location    AP (cm) Trans (cm) PSV (cm/s) Waveform Thrombus Comments   +-----------+-------+----------+----------+--------+--------+--------+   Proximal    1.70    1.92       64                                      +-----------+-------+----------+----------+--------+--------+--------+   Mid         2.20    2.12       45                                      +-----------+-------+----------+----------+--------+--------+--------+   Distal      3.30    3.69       33                                      +-----------+-------+----------+----------+--------+--------+--------+   RT CIA Prox 1.1     1.2        267                                     +-----------+-------+----------+----------+--------+--------+--------+   LT CIA Prox 1.0     1.1        143                                     +-----------+-------+----------+----------+--------+--------+--------+   Summary:  Abdominal Aorta: There is evidence of abnormal dilatation of the distal  Abdominal aorta.  The largest aortic measurement is 3.7 cm. The largest  aortic diameter has increased compared to prior exam. Previous diameter  measurement was 3.3 cm obtained on 10/09/2017.   Stenosis: +------------------+-------------+   Location           Stenosis        +------------------+-------------+   Right Common Iliac >50% stenosis   +------------------+-------------+   Non-Invasive Vascular Imaging:   Carotid Duplex on 11/30/2021: Right:  normal Left:  1-39% ICA stenosis Vertebrals:  Bilateral vertebral arteries demonstrate antegrade flow.  Subclavians: Normal flow hemodynamics were seen in bilateral subclavian arteries.      ASSESSMENT/PLAN:: 81 y.o. male here for follow up for PAD with hx of right CEA in 2014 and s/p left fem-pop bypass graft in [place] in 1994. Dr. [name] revised this in 2001. He had angioplasty of this in 2016.   PAD -pt's ABI's are stable and slightly improved on the right.  He continues his walking regimen.  He does a greater than 50% right iliac stenosis with thigh claudication.  He states this is tolerable.  He will continue with walking program and if he develops any non healing wounds, rest pain or the cramping becomes unbearable, he will call us sooner.   his bypass is patent without hemodynamically significant stenosis -pt will f/u in one year with LLE arterial duplex and ABI  Carotid stenosis -duplex on 11/30/2021 revealed normal right carotid and 1-39% left ICA stenosis -he remains asymptomatic -discussed s/s of stroke/TIA and if pt develops any sx, they know to call 911 or go to the emergency room -pt will f/u in one year with carotid duplex  AAA -duplex on 11/30/2021 revealed maximum diameter of 3.69cm, which is slightly increased in the 3 years since it was last checked.  His last AAA duplex was in 2019 that measure 3.3cm.  He did have a CT scan in 2021 that revealed 3.4cm AAA.  Discussed with pt that the risk of rupture is not zero but is low given the AAA is 3.7cm.  discussed that we would consider repair if it reaches 5.5cm.  -he knows to call 911 if he develops sudden severe onset of back or abdominal pain.   We will have him f/u again in one year with AAA duplex.   -continue statin.  He is on Eliquis.   Leontine Locket, [facility] [phone]  Clinic MD:   [name]

## 2021-12-07 ENCOUNTER — Other Ambulatory Visit: Payer: Self-pay

## 2021-12-07 ENCOUNTER — Ambulatory Visit: Payer: PPO | Admitting: Physician Assistant

## 2021-12-07 ENCOUNTER — Encounter: Payer: Self-pay | Admitting: Physician Assistant

## 2021-12-07 VITALS — BP 145/63 | HR 65 | Temp 96.4°F | Ht 72.0 in | Wt 193.8 lb

## 2021-12-07 DIAGNOSIS — I739 Peripheral vascular disease, unspecified: Secondary | ICD-10-CM

## 2021-12-07 DIAGNOSIS — I6523 Occlusion and stenosis of bilateral carotid arteries: Secondary | ICD-10-CM

## 2021-12-07 DIAGNOSIS — I714 Abdominal aortic aneurysm, without rupture, unspecified: Secondary | ICD-10-CM

## 2021-12-13 ENCOUNTER — Other Ambulatory Visit: Payer: Self-pay | Admitting: Cardiology

## 2021-12-15 ENCOUNTER — Other Ambulatory Visit (HOSPITAL_COMMUNITY): Payer: Self-pay | Admitting: Cardiology

## 2021-12-18 ENCOUNTER — Other Ambulatory Visit: Payer: Self-pay | Admitting: Gastroenterology

## 2021-12-21 ENCOUNTER — Ambulatory Visit (INDEPENDENT_AMBULATORY_CARE_PROVIDER_SITE_OTHER): Payer: PPO

## 2021-12-21 DIAGNOSIS — I480 Paroxysmal atrial fibrillation: Secondary | ICD-10-CM | POA: Diagnosis not present

## 2021-12-24 LAB — CUP PACEART REMOTE DEVICE CHECK
Date Time Interrogation Session: 20230323001950
Implantable Pulse Generator Implant Date: 20200709

## 2022-01-01 DIAGNOSIS — H401133 Primary open-angle glaucoma, bilateral, severe stage: Secondary | ICD-10-CM | POA: Diagnosis not present

## 2022-01-01 NOTE — Progress Notes (Signed)
Carelink Summary Report / Loop Recorder 

## 2022-01-08 ENCOUNTER — Other Ambulatory Visit (HOSPITAL_COMMUNITY): Payer: Self-pay | Admitting: Cardiology

## 2022-01-08 DIAGNOSIS — G4733 Obstructive sleep apnea (adult) (pediatric): Secondary | ICD-10-CM | POA: Diagnosis not present

## 2022-01-09 ENCOUNTER — Encounter: Payer: Self-pay | Admitting: Internal Medicine

## 2022-01-09 NOTE — Progress Notes (Signed)
? ? ? ? ?Subjective:  ? ? Patient ID: Troy Kampf [name], male    DOB: 1940-11-20, 81 y.o.   MRN: [id number] ? ?This visit occurred during the SARS-CoV-2 public health emergency.  Safety protocols were in place, including screening questions prior to the visit, additional usage of staff PPE, and extensive cleaning of exam room while observing appropriate contact time as indicated for disinfecting solutions.   ? ? ?HPI ?Troy [name] is here for follow up of his chronic medical problems, including htn, hypothyroid, prediabetes/DM, depression, CKD ? ?He is taking his medications daily as prescribed. ? ?Having back spasms. Started 6-7 months ago.  ? Related to posture.  It usually occurs when he is sitting.  If he can lay down it goes away.  As long as he keeps good posture he will not good.   ? ? ?Medications and allergies reviewed with patient and updated if appropriate. ? ?Current Outpatient Medications on File Prior to Visit  ?Medication Sig Dispense Refill  ? acetaminophen (TYLENOL) 325 MG tablet Take 650 mg by mouth every 6 (six) hours as needed for moderate pain or headache.    ? albuterol (VENTOLIN HFA) 108 (90 Base) MCG/ACT inhaler Inhale 2 puffs into the lungs every 6 (six) hours as needed for wheezing or shortness of breath. 8.5 g 0  ? amLODipine (NORVASC) 5 MG tablet TAKE ONE TABLET BY MOUTH EVERY DAY 60 tablet 2  ? apixaban (ELIQUIS) 5 MG TABS tablet Take 1 tablet (5 mg total) by mouth 2 (two) times daily. 60 tablet 6  ? Brinzolamide-Brimonidine (SIMBRINZA) 1-0.2 % SUSP Place 1 drop into both eyes 2 times daily.    ? Budeson-Glycopyrrol-Formoterol (BREZTRI AEROSPHERE) 160-9-4.8 MCG/ACT AERO Inhale 2 puffs into the lungs in the morning and at bedtime. 10.7 g 0  ? docusate sodium (COLACE) 100 MG capsule Take 100 mg by mouth 2 (two) times daily.     ? escitalopram (LEXAPRO) 10 MG tablet TAKE ONE TABLET BY MOUTH EVERY DAY 28 tablet 5  ? furosemide (LASIX) 20 MG tablet Take 3 tablets (60 mg total) by mouth 2 (two)  times daily. 180 tablet 3  ? JARDIANCE 10 MG TABS tablet TAKE ONE TABLET BY MOUTH EVERY DAY BEFORE BREAKFAST. 30 tablet 11  ? latanoprost (XALATAN) 0.005 % ophthalmic solution Place 1 drop into both eyes at bedtime.    ? losartan (COZAAR) 25 MG tablet TAKE ONE TABLET BY MOUTH EVERY DAY 90 tablet 3  ? Melatonin 10 MG TABS Take 2 tablets by mouth at bedtime.    ? metoprolol succinate (TOPROL-XL) 25 MG 24 hr tablet TAKE 1/2 TABLET IN THE EVENING WITH OR IMMEDIATELY FOLLOWING A MEAL 30 tablet 3  ? montelukast (SINGULAIR) 10 MG tablet TAKE ONE TABLET AT BEDTIME 28 tablet 5  ? Multiple Vitamins-Minerals (CENTRUM SILVER PO) Take 1 tablet by mouth daily.    ? omeprazole (PRILOSEC) 20 MG capsule TAKE ONE CAPSULE TWICE DAILY 56 capsule 3  ? potassium chloride SA (KLOR-CON M) 20 MEQ tablet Take 2 tablets (40 mEq total) by mouth every morning AND 1 tablet (20 mEq total) every evening. 90 tablet 6  ? rosuvastatin (CRESTOR) 40 MG tablet TAKE ONE TABLET ONCE DAILY 60 tablet 3  ? senna (SENOKOT) 8.6 MG tablet Take 1 tablet by mouth 2 (two) times daily.    ? VASCEPA 1 g capsule Take 2 capsules (2 g total) by mouth 2 (two) times daily. 120 capsule 11  ? ?No current facility-administered medications on file  prior to visit.  ? ? ? ?Review of Systems  ?Constitutional:  Negative for appetite change and fever.  ?Respiratory:  Positive for shortness of breath (uses inhaler sometimes and it helps). Negative for cough and wheezing.   ?Cardiovascular:  Negative for chest pain, palpitations and leg swelling.  ?Neurological:  Negative for light-headedness and headaches.  ? ?   ?Objective:  ? ?Vitals:  ? 01/10/22 1327  ?BP: 138/60  ?Pulse: 78  ?Temp: 98.2 ?F (36.8 ?C)  ?SpO2: 93%  ? ?BP Readings from Last 3 Encounters:  ?01/10/22 138/60  ?12/07/21 (!) 145/63  ?11/15/21 120/60  ? ?Wt Readings from Last 3 Encounters:  ?01/10/22 194 lb (88 kg)  ?12/07/21 193 lb 12.8 oz (87.9 kg)  ?11/15/21 194 lb 12.8 oz (88.4 kg)  ? ?Body mass index is 26.31  kg/m?. ? ?  ?Physical Exam ?Constitutional:   ?   General: He is not in acute distress. ?   Appearance: Normal appearance. He is not ill-appearing.  ?HENT:  ?   Head: Normocephalic and atraumatic.  ?Eyes:  ?   Conjunctiva/sclera: Conjunctivae normal.  ?Cardiovascular:  ?   Rate and Rhythm: Normal rate and regular rhythm.  ?   Heart sounds: Normal heart sounds. No murmur heard. ?Pulmonary:  ?   Effort: Pulmonary effort is normal. No respiratory distress.  ?   Breath sounds: Normal breath sounds. No wheezing or rales.  ?Musculoskeletal:  ?   Right lower leg: No edema.  ?   Left lower leg: No edema.  ?Skin: ?   General: Skin is warm and dry.  ?   Findings: No rash.  ?Neurological:  ?   Mental Status: He is alert. Mental status is at baseline.  ?Psychiatric:     ?   Mood and Affect: Mood normal.  ? ?   ? ?Lab Results  ?Component Value Date  ? WBC 13.6 (H) 07/11/2021  ? HGB 13.4 07/11/2021  ? HCT 40.2 07/11/2021  ? PLT 436.0 (H) 07/11/2021  ? GLUCOSE 199 (H) 11/15/2021  ? CHOL 80 10/24/2021  ? TRIG 74 10/24/2021  ? HDL 31 (L) 10/24/2021  ? LDLDIRECT 41.0 07/11/2021  ? Viola 34 10/24/2021  ? ALT 47 07/11/2021  ? AST 26 07/11/2021  ? NA 135 11/15/2021  ? K 4.2 11/15/2021  ? CL 101 11/15/2021  ? CREATININE 1.70 (H) 11/15/2021  ? BUN 24 (H) 11/15/2021  ? CO2 22 11/15/2021  ? TSH 2.19 06/08/2021  ? PSA 1.53 01/15/2008  ? INR 1.8 (H) 07/01/2021  ? HGBA1C 7.0 (H) 07/11/2021  ? MICROALBUR 0.4 09/29/2007  ? ? ? ?Assessment & Plan:  ? ? ?See Problem List for Assessment and Plan of chronic medical problems.  ? ? ?

## 2022-01-09 NOTE — Patient Instructions (Addendum)
? ? ? ?  Blood work was ordered.   ? ? ?Medications changes include :   None ? ?.  ? ? ?Return in about 6 months (around 07/12/2022) for follow up. ? ?

## 2022-01-10 ENCOUNTER — Ambulatory Visit (INDEPENDENT_AMBULATORY_CARE_PROVIDER_SITE_OTHER): Payer: PPO | Admitting: Internal Medicine

## 2022-01-10 VITALS — BP 138/60 | HR 78 | Temp 98.2°F | Ht 72.0 in | Wt 194.0 lb

## 2022-01-10 DIAGNOSIS — J3089 Other allergic rhinitis: Secondary | ICD-10-CM

## 2022-01-10 DIAGNOSIS — I480 Paroxysmal atrial fibrillation: Secondary | ICD-10-CM | POA: Diagnosis not present

## 2022-01-10 DIAGNOSIS — K838 Other specified diseases of biliary tract: Secondary | ICD-10-CM

## 2022-01-10 DIAGNOSIS — R7303 Prediabetes: Secondary | ICD-10-CM

## 2022-01-10 DIAGNOSIS — R7989 Other specified abnormal findings of blood chemistry: Secondary | ICD-10-CM | POA: Diagnosis not present

## 2022-01-10 DIAGNOSIS — F3289 Other specified depressive episodes: Secondary | ICD-10-CM

## 2022-01-10 DIAGNOSIS — R748 Abnormal levels of other serum enzymes: Secondary | ICD-10-CM

## 2022-01-10 DIAGNOSIS — Z8639 Personal history of other endocrine, nutritional and metabolic disease: Secondary | ICD-10-CM

## 2022-01-10 DIAGNOSIS — N1832 Chronic kidney disease, stage 3b: Secondary | ICD-10-CM | POA: Diagnosis not present

## 2022-01-10 DIAGNOSIS — I10 Essential (primary) hypertension: Secondary | ICD-10-CM

## 2022-01-10 LAB — CBC WITH DIFFERENTIAL/PLATELET
Basophils Absolute: 0 10*3/uL (ref 0.0–0.1)
Basophils Relative: 0.4 % (ref 0.0–3.0)
Eosinophils Absolute: 0.2 10*3/uL (ref 0.0–0.7)
Eosinophils Relative: 3.4 % (ref 0.0–5.0)
HCT: 39.4 % (ref 39.0–52.0)
Hemoglobin: 13.1 g/dL (ref 13.0–17.0)
Lymphocytes Relative: 11.8 % — ABNORMAL LOW (ref 12.0–46.0)
Lymphs Abs: 0.7 10*3/uL (ref 0.7–4.0)
MCHC: 33.3 g/dL (ref 30.0–36.0)
MCV: 89.3 fl (ref 78.0–100.0)
Monocytes Absolute: 0.3 10*3/uL (ref 0.1–1.0)
Monocytes Relative: 4.8 % (ref 3.0–12.0)
Neutro Abs: 4.5 10*3/uL (ref 1.4–7.7)
Neutrophils Relative %: 79.6 % — ABNORMAL HIGH (ref 43.0–77.0)
Platelets: 284 10*3/uL (ref 150.0–400.0)
RBC: 4.41 Mil/uL (ref 4.22–5.81)
RDW: 17.5 % — ABNORMAL HIGH (ref 11.5–15.5)
WBC: 5.7 10*3/uL (ref 4.0–10.5)

## 2022-01-10 LAB — TSH: TSH: 1.69 u[IU]/mL (ref 0.35–5.50)

## 2022-01-10 LAB — COMPREHENSIVE METABOLIC PANEL
ALT: 187 U/L — ABNORMAL HIGH (ref 0–53)
AST: 119 U/L — ABNORMAL HIGH (ref 0–37)
Albumin: 4.2 g/dL (ref 3.5–5.2)
Alkaline Phosphatase: 299 U/L — ABNORMAL HIGH (ref 39–117)
BUN: 19 mg/dL (ref 6–23)
CO2: 27 mEq/L (ref 19–32)
Calcium: 9.3 mg/dL (ref 8.4–10.5)
Chloride: 103 mEq/L (ref 96–112)
Creatinine, Ser: 1.78 mg/dL — ABNORMAL HIGH (ref 0.40–1.50)
GFR: 35.39 mL/min — ABNORMAL LOW (ref >60.00)
Glucose, Bld: 107 mg/dL — ABNORMAL HIGH (ref 70–99)
Potassium: 4.4 mEq/L (ref 3.5–5.1)
Sodium: 139 mEq/L (ref 135–145)
Total Bilirubin: 0.9 mg/dL (ref 0.2–1.2)
Total Protein: 7 g/dL (ref 6.0–8.3)

## 2022-01-10 LAB — MICROALBUMIN / CREATININE URINE RATIO
Creatinine,U: 49.9 mg/dL
Microalb Creat Ratio: 2.3 mg/g (ref 0.0–30.0)
Microalb, Ur: 1.2 mg/dL (ref 0.0–1.9)

## 2022-01-10 LAB — HEMOGLOBIN A1C: Hgb A1c MFr Bld: 6.7 % — ABNORMAL HIGH (ref 4.6–6.5)

## 2022-01-10 NOTE — Assessment & Plan Note (Signed)
Chronic ?Controlled, Stable ?Continue Lexapro 10 mg daily ?

## 2022-01-10 NOTE — Assessment & Plan Note (Signed)
Chronic ?Following with cardiology ?On Eliquis, metoprolol, amlodipine ?CBC, CMP, TSH ?

## 2022-01-10 NOTE — Assessment & Plan Note (Signed)
Chronic ?Blood pressure well controlled ?CMP ?Continue amlodipine 5 mg daily, losartan 25 mg daily, metoprolol XL 12.5 mg daily ?

## 2022-01-10 NOTE — Assessment & Plan Note (Signed)
Chronic ?Continue singular 10 mg daily ?

## 2022-01-10 NOTE — Assessment & Plan Note (Signed)
Chronic ?Follows with [facility] ?

## 2022-01-10 NOTE — Assessment & Plan Note (Signed)
Chronic ?Last A1c was in the diabetic range-if it continues in diabetic range poorly we will classify him as a diabetic ?Check a1c, urine microalbumin ?Low sugar / carb diet ?Stressed regular exercise ?

## 2022-01-10 NOTE — Assessment & Plan Note (Signed)
History of amiodarone induced thyrotoxicosis ?Last TSH in normal range ?We will monitor TSH at least once a year-check today ?

## 2022-01-11 ENCOUNTER — Encounter: Payer: Self-pay | Admitting: Internal Medicine

## 2022-01-11 NOTE — Addendum Note (Signed)
Addended by: Binnie Rail on: 01/11/2022 12:02 PM ? ? Modules accepted: Orders ? ?

## 2022-01-12 ENCOUNTER — Ambulatory Visit
Admission: RE | Admit: 2022-01-12 | Discharge: 2022-01-12 | Disposition: A | Discharge status: Home / Self Care | Payer: PPO | Source: Ambulatory Visit | Attending: Internal Medicine | Admitting: Internal Medicine

## 2022-01-12 DIAGNOSIS — R748 Abnormal levels of other serum enzymes: Secondary | ICD-10-CM

## 2022-01-12 DIAGNOSIS — R945 Abnormal results of liver function studies: Secondary | ICD-10-CM | POA: Diagnosis not present

## 2022-01-12 DIAGNOSIS — K838 Other specified diseases of biliary tract: Secondary | ICD-10-CM | POA: Diagnosis not present

## 2022-01-15 ENCOUNTER — Telehealth: Payer: Self-pay | Admitting: Gastroenterology

## 2022-01-15 DIAGNOSIS — K838 Other specified diseases of biliary tract: Secondary | ICD-10-CM

## 2022-01-15 DIAGNOSIS — R7989 Other specified abnormal findings of blood chemistry: Secondary | ICD-10-CM

## 2022-01-15 NOTE — Telephone Encounter (Signed)
Alyse Low can you please send the referral to Dr Rush Landmark and Ardis Hughs for review of possible EUS? Thank you  ?

## 2022-01-15 NOTE — Addendum Note (Signed)
Addended by: Binnie Rail on: 01/15/2022 09:57 AM ? ? Modules accepted: Orders ? ?

## 2022-01-16 ENCOUNTER — Other Ambulatory Visit: Payer: Self-pay

## 2022-01-16 DIAGNOSIS — R7989 Other specified abnormal findings of blood chemistry: Secondary | ICD-10-CM

## 2022-01-16 DIAGNOSIS — K838 Other specified diseases of biliary tract: Secondary | ICD-10-CM

## 2022-01-16 NOTE — Telephone Encounter (Signed)
EUS scheduled, pt instructed and medications reviewed.  Patient instructions mailed to home and sent to voice mail .  Patient to call with any questions or concerns. He will follow up with PCP in regards to MRCP  ?

## 2022-01-16 NOTE — Telephone Encounter (Signed)
MRCP ordered w/o contrast due to CKD 3b ?

## 2022-01-16 NOTE — Telephone Encounter (Signed)
The PCP has been advised of the MRCP recommendation.  I have the pt on for EUS ERCP 02/06/22 at 1030 am at [facility]. ?

## 2022-01-16 NOTE — Addendum Note (Signed)
Addended by: Binnie Rail on: 01/16/2022 12:34 PM ? ? Modules accepted: Orders ? ?

## 2022-01-17 ENCOUNTER — Other Ambulatory Visit (INDEPENDENT_AMBULATORY_CARE_PROVIDER_SITE_OTHER): Payer: PPO

## 2022-01-17 DIAGNOSIS — R748 Abnormal levels of other serum enzymes: Secondary | ICD-10-CM

## 2022-01-17 LAB — COMPREHENSIVE METABOLIC PANEL
ALT: 95 U/L — ABNORMAL HIGH (ref 0–53)
AST: 86 U/L — ABNORMAL HIGH (ref 0–37)
Albumin: 4 g/dL (ref 3.5–5.2)
Alkaline Phosphatase: 269 U/L — ABNORMAL HIGH (ref 39–117)
BUN: 25 mg/dL — ABNORMAL HIGH (ref 6–23)
CO2: 23 mEq/L (ref 19–32)
Calcium: 8.9 mg/dL (ref 8.4–10.5)
Chloride: 103 mEq/L (ref 96–112)
Creatinine, Ser: 1.94 mg/dL — ABNORMAL HIGH (ref 0.40–1.50)
GFR: 31.91 mL/min — ABNORMAL LOW (ref >60.00)
Glucose, Bld: 105 mg/dL — ABNORMAL HIGH (ref 70–99)
Potassium: 4.3 mEq/L (ref 3.5–5.1)
Sodium: 135 mEq/L (ref 135–145)
Total Bilirubin: 0.5 mg/dL (ref 0.2–1.2)
Total Protein: 6.7 g/dL (ref 6.0–8.3)

## 2022-01-18 ENCOUNTER — Ambulatory Visit
Admission: RE | Admit: 2022-01-18 | Discharge: 2022-01-18 | Disposition: A | Discharge status: Home / Self Care | Payer: PPO | Source: Ambulatory Visit | Attending: Internal Medicine | Admitting: Internal Medicine

## 2022-01-18 DIAGNOSIS — K838 Other specified diseases of biliary tract: Secondary | ICD-10-CM

## 2022-01-18 DIAGNOSIS — K8050 Calculus of bile duct without cholangitis or cholecystitis without obstruction: Secondary | ICD-10-CM | POA: Diagnosis not present

## 2022-01-18 DIAGNOSIS — R7989 Other specified abnormal findings of blood chemistry: Secondary | ICD-10-CM

## 2022-01-18 DIAGNOSIS — K828 Other specified diseases of gallbladder: Secondary | ICD-10-CM | POA: Diagnosis not present

## 2022-01-22 ENCOUNTER — Other Ambulatory Visit: Payer: Self-pay

## 2022-01-22 DIAGNOSIS — K838 Other specified diseases of biliary tract: Secondary | ICD-10-CM

## 2022-01-22 DIAGNOSIS — R7989 Other specified abnormal findings of blood chemistry: Secondary | ICD-10-CM

## 2022-01-23 ENCOUNTER — Ambulatory Visit (INDEPENDENT_AMBULATORY_CARE_PROVIDER_SITE_OTHER): Payer: PPO

## 2022-01-23 DIAGNOSIS — I480 Paroxysmal atrial fibrillation: Secondary | ICD-10-CM

## 2022-01-24 LAB — CUP PACEART REMOTE DEVICE CHECK
Date Time Interrogation Session: 20230425002755
Implantable Pulse Generator Implant Date: 20200709

## 2022-01-25 ENCOUNTER — Other Ambulatory Visit (INDEPENDENT_AMBULATORY_CARE_PROVIDER_SITE_OTHER): Payer: PPO

## 2022-01-25 DIAGNOSIS — K838 Other specified diseases of biliary tract: Secondary | ICD-10-CM

## 2022-01-25 DIAGNOSIS — R7989 Other specified abnormal findings of blood chemistry: Secondary | ICD-10-CM | POA: Diagnosis not present

## 2022-01-25 LAB — CBC WITH DIFFERENTIAL/PLATELET
Basophils Absolute: 0 10*3/uL (ref 0.0–0.1)
Basophils Relative: 1 % (ref 0.0–3.0)
Eosinophils Absolute: 0.2 10*3/uL (ref 0.0–0.7)
Eosinophils Relative: 3.6 % (ref 0.0–5.0)
HCT: 40.2 % (ref 39.0–52.0)
Hemoglobin: 13.4 g/dL (ref 13.0–17.0)
Lymphocytes Relative: 20.1 % (ref 12.0–46.0)
Lymphs Abs: 0.9 10*3/uL (ref 0.7–4.0)
MCHC: 33.3 g/dL (ref 30.0–36.0)
MCV: 89.5 fl (ref 78.0–100.0)
Monocytes Absolute: 0.3 10*3/uL (ref 0.1–1.0)
Monocytes Relative: 6.5 % (ref 3.0–12.0)
Neutro Abs: 3.2 10*3/uL (ref 1.4–7.7)
Neutrophils Relative %: 68.8 % (ref 43.0–77.0)
Platelets: 285 10*3/uL (ref 150.0–400.0)
RBC: 4.49 Mil/uL (ref 4.22–5.81)
RDW: 16.7 % — ABNORMAL HIGH (ref 11.5–15.5)
WBC: 4.6 10*3/uL (ref 4.0–10.5)

## 2022-01-26 LAB — COMPREHENSIVE METABOLIC PANEL
ALT: 52 U/L (ref 0–53)
AST: 32 U/L (ref 0–37)
Albumin: 4.2 g/dL (ref 3.5–5.2)
Alkaline Phosphatase: 232 U/L — ABNORMAL HIGH (ref 39–117)
BUN: 28 mg/dL — ABNORMAL HIGH (ref 6–23)
CO2: 26 mEq/L (ref 19–32)
Calcium: 9.3 mg/dL (ref 8.4–10.5)
Chloride: 100 mEq/L (ref 96–112)
Creatinine, Ser: 1.66 mg/dL — ABNORMAL HIGH (ref 0.40–1.50)
GFR: 38.47 mL/min — ABNORMAL LOW (ref >60.00)
Glucose, Bld: 126 mg/dL — ABNORMAL HIGH (ref 70–99)
Potassium: 4.2 mEq/L (ref 3.5–5.1)
Sodium: 137 mEq/L (ref 135–145)
Total Bilirubin: 0.4 mg/dL (ref 0.2–1.2)
Total Protein: 7.1 g/dL (ref 6.0–8.3)

## 2022-01-31 ENCOUNTER — Telehealth: Payer: Self-pay

## 2022-01-31 ENCOUNTER — Encounter (HOSPITAL_COMMUNITY): Payer: Self-pay | Admitting: Gastroenterology

## 2022-01-31 NOTE — Telephone Encounter (Signed)
[facility] Pre-operative Risk Assessment  ?   ?Request for surgical clearance:     Endoscopy Procedure ? ?What type of surgery is being performed?     ERCP ? ?When is this surgery scheduled?     02/06/22 ? ?What type of clearance is required ?   Pharmacy ? ?Are there any medications that need to be held prior to surgery and how long? Eliquis - 2 DAYS ? ?Practice name and name of physician performing surgery?      [facility] ? ?What is your office phone and fax number?      Phone- [phone]  Fax- [phone] ? ?Anesthesia type (None, local, MAC, general) ?       [facility] ? ?

## 2022-01-31 NOTE — Telephone Encounter (Signed)
See 01/31/22 cardiac clearance encounter for details. ?

## 2022-01-31 NOTE — Telephone Encounter (Signed)
We will route to Pharm for input. Patient has follow up appointment with HF clinic tomorrow. Pre op team will follow for clinical assessment before finalizing.   ?

## 2022-01-31 NOTE — Telephone Encounter (Signed)
? ?  Patient Name: Jaymir Struble [name]  ?DOB: April 12, 1941 ?MRN: [id number] ? ?Primary Cardiologist: [name] ? ?Chart reviewed as part of pre-operative protocol coverage. As outlined below, PharmD feels patient is high risk off anticoagulation and recommends 1 day hold; they recommended clearance from [name] to hold for 2 days. They also outlined a need for medication dose adjustment as well (Eliquis dosing). Since patient has office visit scheduled tomorrow with [name], likely is best for this to be reviewed at time of visit.  ? ?I reached out to CHF clinic staff to make sure this gets relayed to [name] to address at their [place] tomorrow. They will include in preop comments in appt notes. Per office protocol, the provider seeing this patient should forward their finalized clearance decision to requesting party below. Will also cc to [name] so he is aware to review tomorrow. ? ?Charlie Pitter, PA-C ?01/31/2022, 2:46 PM ? ? ? ?

## 2022-01-31 NOTE — Telephone Encounter (Signed)
-----   Message from Irving Copas., MD sent at 01/31/2022  1:08 PM EDT ----- ?Regarding: RE: Appt at [facility] on 02/06/22, cardiac clearance?? ?[place], ?Thanks for catching this. ?This patient will need Eliquis hold. ?Please make sure we get approval for hold.  2 days prior is what I would like. ?Thanks. ?GM ? ?----- Message ----- ?From: Yevette Edwards, RN ?Sent: 01/31/2022   1:06 PM EDT ?To: Irving Copas., MD ?Subject: Appt at [facility] on 02/06/22, cardiac clearance??     ? ?Dr. Rush Landmark, ?We received a call from [facility] in the pre-service department at [facility]. She wanted to know if patient should be holding his Eliquis for his upcoming procedure. I did not see it documented anywhere in his chart. If you would like him to hold the Eliquis 2 days prior to his appt please let me know so that we can initiate a cardiac clearance request. ?Thanks, ?Gerre Couch, RN ? ? ?

## 2022-01-31 NOTE — Telephone Encounter (Signed)
Patient with diagnosis of A Fib on Eliquis for anticoagulation.   ? ?Procedure:  ERCP ?Date of procedure: 02/06/22 ? ?CHA2DS2-VASc Score = 8  ?This indicates a 10.8% annual risk of stroke. ?The patient's score is based upon: ?CHF History: 1 ?HTN History: 1 ?Diabetes History: 1 ?Stroke History: 2 ?Vascular Disease History: 1 ?Age Score: 2 ?Gender Score: 0 ? ? ?CrCl 43 mL/min ?Platelet count 285K ? ?Patient is very high risk off anticoagulation. Recommend 1 day Eliquis hold. Will need clearance from Dr Aundra Dubin to hold for 2 days. ? ?Of note, patient is on incorrect dose of Eliquis for A Fib. Patient should be on 2.5mg  BID. ? ?

## 2022-02-01 ENCOUNTER — Ambulatory Visit (HOSPITAL_COMMUNITY)
Admission: RE | Admit: 2022-02-01 | Discharge: 2022-02-01 | Disposition: A | Discharge status: Home / Self Care | Payer: PPO | Source: Ambulatory Visit | Attending: Cardiology | Admitting: Cardiology

## 2022-02-01 ENCOUNTER — Encounter (HOSPITAL_COMMUNITY): Payer: Self-pay | Admitting: Cardiology

## 2022-02-01 VITALS — BP 130/70 | HR 58 | Wt 193.8 lb

## 2022-02-01 DIAGNOSIS — E785 Hyperlipidemia, unspecified: Secondary | ICD-10-CM | POA: Insufficient documentation

## 2022-02-01 DIAGNOSIS — I480 Paroxysmal atrial fibrillation: Secondary | ICD-10-CM

## 2022-02-01 DIAGNOSIS — Z7984 Long term (current) use of oral hypoglycemic drugs: Secondary | ICD-10-CM | POA: Diagnosis not present

## 2022-02-01 DIAGNOSIS — I130 Hypertensive heart and chronic kidney disease with heart failure and stage 1 through stage 4 chronic kidney disease, or unspecified chronic kidney disease: Secondary | ICD-10-CM | POA: Insufficient documentation

## 2022-02-01 DIAGNOSIS — I5032 Chronic diastolic (congestive) heart failure: Secondary | ICD-10-CM | POA: Insufficient documentation

## 2022-02-01 DIAGNOSIS — Z902 Acquired absence of lung [part of]: Secondary | ICD-10-CM | POA: Diagnosis not present

## 2022-02-01 DIAGNOSIS — I6521 Occlusion and stenosis of right carotid artery: Secondary | ICD-10-CM | POA: Insufficient documentation

## 2022-02-01 DIAGNOSIS — Z7901 Long term (current) use of anticoagulants: Secondary | ICD-10-CM | POA: Diagnosis not present

## 2022-02-01 DIAGNOSIS — Z85118 Personal history of other malignant neoplasm of bronchus and lung: Secondary | ICD-10-CM | POA: Insufficient documentation

## 2022-02-01 DIAGNOSIS — I739 Peripheral vascular disease, unspecified: Secondary | ICD-10-CM | POA: Diagnosis not present

## 2022-02-01 DIAGNOSIS — I4819 Other persistent atrial fibrillation: Secondary | ICD-10-CM | POA: Insufficient documentation

## 2022-02-01 DIAGNOSIS — Z79899 Other long term (current) drug therapy: Secondary | ICD-10-CM | POA: Insufficient documentation

## 2022-02-01 DIAGNOSIS — I714 Abdominal aortic aneurysm, without rupture, unspecified: Secondary | ICD-10-CM | POA: Diagnosis not present

## 2022-02-01 DIAGNOSIS — J439 Emphysema, unspecified: Secondary | ICD-10-CM | POA: Diagnosis not present

## 2022-02-01 DIAGNOSIS — I2510 Atherosclerotic heart disease of native coronary artery without angina pectoris: Secondary | ICD-10-CM | POA: Insufficient documentation

## 2022-02-01 DIAGNOSIS — N183 Chronic kidney disease, stage 3 unspecified: Secondary | ICD-10-CM | POA: Diagnosis not present

## 2022-02-01 DIAGNOSIS — C187 Malignant neoplasm of sigmoid colon: Secondary | ICD-10-CM | POA: Insufficient documentation

## 2022-02-01 NOTE — Patient Instructions (Signed)
There has been no changes to your medications. ? ?Your physician recommends that you schedule a follow-up appointment in: 3 months. ? ?If you have any questions or concerns before your next appointment please send [name] a message through [place] or call our office at [phone].   ? ?TO LEAVE A MESSAGE FOR THE NURSE SELECT OPTION 2, PLEASE LEAVE A MESSAGE INCLUDING: ?YOUR NAME ?DATE OF BIRTH ?CALL BACK NUMBER ?REASON FOR CALL**this is important as we prioritize the call backs ? ?YOU WILL RECEIVE A CALL BACK THE SAME DAY AS LONG AS YOU CALL BEFORE 4:00 PM ? ?At the [facility], you and your health needs are our priority. As part of our continuing mission to provide you with exceptional heart care, we have created designated Provider Care Teams. These Care Teams include your primary Cardiologist (physician) and Advanced Practice Providers (APPs- Physician Assistants and Nurse Practitioners) who all work together to provide you with the care you need, when you need it.  ? ?You may see any of the following providers on your designated Care Team at your next follow up: ?Dr Glori Bickers ?Dr [name] ?Darrick Grinder, NP ?Lyda Jester, PA ?Jessica Milford,NP ?Marlyce Huge, PA ?Audry Riles, PharmD ? ? ?Please be sure to bring in all your medications bottles to every appointment.  ? ? ?

## 2022-02-02 NOTE — Telephone Encounter (Signed)
I have not received any cardiac clearance approval after patient's visit with [name] yesterday.  ? ?I called Dr. Claris Gladden office today and I am not able to get through on the Physician line. I left a detailed message on the nurse triage line with request. I have asked that someone give me a call back. I provided them with my direct line. ?

## 2022-02-02 NOTE — Telephone Encounter (Signed)
Sent a secure staff message to [name] regarding pt's cardiac clearance. Dr.McLean is OK with pt holding Eliquis 2 days prior to upcoming procedure. See cardiac clearance telephone encounter for details. ?

## 2022-02-02 NOTE — Telephone Encounter (Signed)
RE: Cardiac Clearance ?Received: Today ?Larey Dresser, MD  Yevette Edwards, RN ?Ok to hold eliquis for procedure.   ? ? ?Called and spoke with patient, he is aware that he can hold Eliquis 2 days prior to his upcoming procedure. Pt knows that tomorrow will be his last dose and he will hold on 5/7 and 5/8. Pt verbalized understanding and had no concerns at the end of the call.  ? ?

## 2022-02-04 NOTE — Progress Notes (Signed)
?Patient ID: Troy Kindred [name], male   DOB: [birth year], 81 y.o.   MRN: [id number] ?PCP: Dr. Quay Burow ?Cardiology: [name] ? ?81 y.o. with history of HTN, COPD, active smoking/COPD, carotid stenosis s/p right CEA, paroxysmal atrial fibrillation, and PAD.   He does not have known obstructive CAD but is at high risk for CAD based on his comorbidities.  Lexiscan Cardiolite in 3/14 showed no ischemia or infarction and echo in 3/14 showed normal EF.  He had a left fem-pop bypass at the [facility] in the '90s.  He is followed at [facility] for PAD. He has chronic right calf/thigh/buttocks claudication that is unchanged over the last few years and follows regularly at [facility].  He has had right lower lobectomy for lung cancer.  He continues to stay off cigarettes.   ? ?At a prior appointment, he was in atrial fibrillation but did not realize it. Dr Aundra Dubin  started him on Eliquis and diltiazem CD, and he spontaneously converted to NSR.  In 5/15, he was at [facility] and felt "strange" one day: fatigued, weak, short of breath.  He went to the ER and was in atrial fibrillation with HR in 80s-90s.  He spontaneously converted to NSR in the ER.  He felt back to normal after converting to NSR.  Started on Multaq 400 mg bid.  With CHF, he was eventually transitioned over to amiodarone.  ? ?He had patch angioplasty revision of left fem-pop bypass in 11/16.  Now with minimal claudication.  He follows with [facility].   ?  ?He had a Cardiolite and echo in 7/17 that were unremarkable.  Repeat Cardiolite in 12/17 showed no ischemia, echo was uninterpretable.  Cardiac MRI was therefore done in 1/18, showing EF 66% with normal-appearing RV, no late gadolinium enhancement.  ? ?Given increased exertional dyspnea and a defect on Cardiolite, he had LHC in 11/18.  This showed no obstructive CAD. He had a chest CT done given concern for possible amiodarone lung toxicity with increased dyspnea. This showed emphysema, no ILD.   ? ?In 5/19, he developed a  probable viral syndrome with dehydration and had a presyncopal episode.  He was orthostatic in the hospital.  He was noted to be in atrial fibrillation this admission which remained persistent.  He had a TEE-guided DCCV back to NSR.  ? ?In 9/19, he was admitted with GI bleeding and found to have a sigmoid mass on colonoscopy, path showed sigmoid adenocarcinoma.  He was noted to have right paratracheal lymphadenopathy but EUS with biopsy in 10/19 did not show malignancy.  He had surgical resection of colon cancer.  ? ?Amiodarone was stopped with elevated ESR and increased dyspnea.  He was also found to have hyperthyroidism. He was treated with methimazole by endocrinology.   ? ?He had atrial fibrillation ablation in 7/20.  ? ?Echo in 4/21 showed EF 60-65%, normal RV.   ? ?In 5/21, due to high atrial fibrillation burden, he had redo atrial fibrillation ablation.  ? ?Cardiolite in 6/21 showed EF 70%, no ischemia/infarction.  ? ?He was admitted in 10/22 with COVID-19 and COPD exacerbation.  He had a thoracentesis, cytology was negative for cancerous cells.  Echo in 10/22 showed EF 55-60%, moderate LVH, mildly decreased RV systolic function.  ? ?Recently, he has been found to have choledocholithiasis with elevated LFTs though most recent LFTs had come down to normal.  Planned for ERCP with Dr. Rush Landmark.  ? ?Today he returns for HF follow up. He walks slowly/shuffles,  primarily due to poor eyesight.  He has a trainer who comes 3 days/week and he does laps in his driveway.  No dyspnea walking slowly on flat ground.  No chest pain.  He has significant PAD, especially in the right leg, but denies claudication.  No palpitations.  ? ?ECG (personally reviewed): NSR with RBBB ? ?Labs (3/13): LDL 75, HDL 35, K 4.1, creatinine 1.1 ?Labs (3/14): LDL 91, HDL 27 ?Labs (10/14): K 4.3, creatinine 0.98 ?Labs (4/15): K 4.9, creatinine 1.1, LDL 76, HDL 30 ?Labs (5/15): K 4.3, creatinine 1.7, BNP 251 ?Labs (6/15): K 4.4, creatinine  1.3 ?Labs (10/15): TSH normal ?Labs (5/16): K 4.2, creatinine 1.12, HCT 39.5, LFTs normal, LDL 84, HDL 43, TSH normal ?Labs (11/16): creatinine 0.94 ?Labs (4/17): LDL 39, HDL 39 ?Labs (6/17): K 4.5, creatinine 1.2 ?Labs (9/17): K 4, creatinine 1.25, HCT 38.7 ?Labs (12/17): K 4.5, creatinine 1.49 => 1.62, BNP 43 ?Labs (2/18): K 3.8, creatinine 1.33, LFTs normal, TSH normal ?Labs (6/18): K 4.3, creatinine 1.49, LFTs normal, TSH normal, hgb 12.3 ?Labs (11/18): ESR 60, TSH normal, LFTs normal, creatinine 1.34 ?Labs (1/19): LDL 50 ?Labs (5/19): K 4.6, creatinine 1.55, LFTs normal, hgb 12.7, LFTs normal ?Labs (10/19): K 4 => 4.1, creatinine 1.39 => 1.43, BNP 279, hgb 10.7, TSH low, free T4 and free T3 low, ESR 108 ?Labs (3/21): K 4.5, creatinine 1.19, TSH normal ?Labs (4/21): K 4.8, creatinine 1.28, BNP 524, LDL 59 ?Labs (7/21): hgb 11.5, creatinine 1.4 ?Labs (8/21): K 4.3, creatinine 1.09 ?Labs (9/21): hgb 12.7 ?Labs (2/22): K 5, creatinine 1.54 ?Labs (5/22): K 4.2, creatinine 1.38, hgb 14.3, LDL 31 ?Labs (10/22): LDL 41, TGs 351, hgb 13.4, K 4.3, creatinine 1.57 ?Labs (1122): K 3.6, creatinine 1.32 ?Labs (1/23): K 4.0, creatinine 1.31, LDL 34 ?Labs (4/23): K 4.2, creatinine 1.66, LFTs normal, hgb 13.3, TSH normal ? ?PMH: ?1. HTN: Fatigue and cough with ramipril use.  ?2. COPD: Quit smoking 2014. PFTs (1/18) with moderately severe COPD.  ?- CT chest (11/18): Emphysema noted, no ILD.  ?3. AAA: CT 1/13 with 3.0 cm AAA.  Abdominal US (1/14) with 3.4 cm AAA. Abdominal US (4/15) with 3.25 x 3.27 AAA. Abdominal US (3/16) with 3.7 cm AAA.  Followed at [facility].  ?- Abd [name] (1/19): 3.3 cm AAA. ?4. Squamous cell lung cancer diagnosed 2/12.  Had right lower lobectomy in 4/12.  ?5. Hyperlipidemia ?6. PAD: Left fem-pop bypass 1994.  ABIs (2/12) 0.62 on right, 0.95 on left. ABIs (1/14): 0.65 on right, 1.04 on left. ABIs (4/15) 0.66 right, 0.87 left.  ABIs (3/16) 0.6 right, 0.88 left.  Patch angioplasty left fem-pop bypass in 11/16.   ?-  Left fem-pop bypass patent on doppler evaluation in 12/17.  ?- ABIs (2/21): normal on left (1.02), moderately decreased on right (0.54).  ?- Peripheral arterial dopplers (3/23): >50% right iliac stenosis, ABIs 0.58 on right and 1.11 on left.  ?7. CAD: Stress myoview 2004 was normal. Lexiscan Cardiolite (3/14) with EF 66%, no ischemia or infarction. Lexiscan Cardiolite (7/17) with EF 61%, no ischemia/infarction.  ?- Cardiolite (12/17) with EF 62%, inferior/inferolateral fixed defect, most likely diaphragmatic attenuation, no ischemia.  ?- Cardiolite (9/18) with EF > 65%, fixed inferior defect (attenuation versus infarction), no ischemia.  ?- LHC (11/18): No obstructive CAD.  ?- Cardiolite (6/21): EF 70%, no ischemia/infarction ?8. Chronic diastolic CHF: Echo (2/53): technically difficult with EF 55-60%, upper normal RV size.  Echo (5/16) with EF 60-65%.  Echo (7/17) with EF 55-60%, normal  RV size and systolic function.  ?- Echo 12/17 with very poor windows, unable to comment on LV or RV function.  ?- Cardiac MRI (1/18) with EF 66%, normal RV size and systolic function.  ?- TEE (5/19): EF 55-60%, normal RV size and systolic function.  ?- Echo (4/21): EF 60-65%, normal RV ?- Echo (10/22): EF 55-60%, moderate LVH, mildly decreased RV systolic function ?9. Carotid stenosis: TIA 10/14.  Carotid dopplers with > 19% RICA, 37-90% LICA.  Patient had right CEA in 10/14. Carotids (4/15) patent right CEA, LICA 24-09% stenosis.  Carotids (73/53) < 29% LICA, patent right CEA.  ?- Carotid dopplers (12/17): right CEA ok, < 92% LICA stenosis.  ?- Carotid dopplers (1/19): Right CEA ok, 4-26% LICA stenosis.  ?- Carotid dopplers (2/21): Right CEA ok, LICA 83-41% stenosis.  ?- Carotid dopplers (3/23): Right CEA ok, LICA 9-62% ?10. Atrial fibrillation: Paroxysmal. First noted after lobectomy in 4/12 (brief), recurrence in 4/15 then in 5/15.  ?- TEE-guided DCCV for persistent atrial fibrillation in 5/19.  ?- Atrial fibrillation ablation  7/20.  ?- Re-do atrial fibrillation ablation 5/21.  ?11. Spinal stenosis ?12. OSA: Using CPAP.  ?13. Glaucoma ?14. Has ILR ?15. Colon cancer: Sigmoid adenocarcinoma, s/p surgical resection.  ?16. Hyperthyroidism

## 2022-02-06 ENCOUNTER — Ambulatory Visit (HOSPITAL_COMMUNITY): Payer: PPO

## 2022-02-06 ENCOUNTER — Ambulatory Visit: Payer: PPO | Admitting: Nurse Practitioner

## 2022-02-06 ENCOUNTER — Telehealth: Payer: Self-pay

## 2022-02-06 ENCOUNTER — Encounter (HOSPITAL_COMMUNITY): Payer: Self-pay

## 2022-02-06 ENCOUNTER — Ambulatory Visit (HOSPITAL_BASED_OUTPATIENT_CLINIC_OR_DEPARTMENT_OTHER): Payer: PPO | Admitting: Anesthesiology

## 2022-02-06 ENCOUNTER — Ambulatory Visit (HOSPITAL_COMMUNITY): Payer: PPO | Admitting: Anesthesiology

## 2022-02-06 ENCOUNTER — Other Ambulatory Visit: Payer: Self-pay

## 2022-02-06 ENCOUNTER — Ambulatory Visit (HOSPITAL_COMMUNITY): Admit: 2022-02-06 | Payer: PPO | Admitting: Gastroenterology

## 2022-02-06 ENCOUNTER — Ambulatory Visit (HOSPITAL_COMMUNITY)
Admission: RE | Admit: 2022-02-06 | Discharge: 2022-02-06 | Disposition: A | Discharge status: Home / Self Care | Payer: PPO | Attending: Gastroenterology | Admitting: Gastroenterology

## 2022-02-06 ENCOUNTER — Encounter (HOSPITAL_COMMUNITY): Payer: Self-pay | Admitting: Gastroenterology

## 2022-02-06 ENCOUNTER — Encounter (HOSPITAL_COMMUNITY)
Admission: RE | Disposition: A | Discharge status: Home / Self Care | Payer: Self-pay | Source: Home / Self Care | Attending: Gastroenterology

## 2022-02-06 DIAGNOSIS — K317 Polyp of stomach and duodenum: Secondary | ICD-10-CM

## 2022-02-06 DIAGNOSIS — I4891 Unspecified atrial fibrillation: Secondary | ICD-10-CM | POA: Diagnosis not present

## 2022-02-06 DIAGNOSIS — K838 Other specified diseases of biliary tract: Secondary | ICD-10-CM | POA: Diagnosis not present

## 2022-02-06 DIAGNOSIS — K3189 Other diseases of stomach and duodenum: Secondary | ICD-10-CM | POA: Diagnosis not present

## 2022-02-06 DIAGNOSIS — I6523 Occlusion and stenosis of bilateral carotid arteries: Secondary | ICD-10-CM | POA: Diagnosis not present

## 2022-02-06 DIAGNOSIS — E1122 Type 2 diabetes mellitus with diabetic chronic kidney disease: Secondary | ICD-10-CM | POA: Insufficient documentation

## 2022-02-06 DIAGNOSIS — R932 Abnormal findings on diagnostic imaging of liver and biliary tract: Secondary | ICD-10-CM | POA: Diagnosis not present

## 2022-02-06 DIAGNOSIS — R945 Abnormal results of liver function studies: Secondary | ICD-10-CM | POA: Diagnosis not present

## 2022-02-06 DIAGNOSIS — J449 Chronic obstructive pulmonary disease, unspecified: Secondary | ICD-10-CM | POA: Insufficient documentation

## 2022-02-06 DIAGNOSIS — Z85118 Personal history of other malignant neoplasm of bronchus and lung: Secondary | ICD-10-CM | POA: Insufficient documentation

## 2022-02-06 DIAGNOSIS — Z902 Acquired absence of lung [part of]: Secondary | ICD-10-CM | POA: Diagnosis not present

## 2022-02-06 DIAGNOSIS — G4733 Obstructive sleep apnea (adult) (pediatric): Secondary | ICD-10-CM | POA: Diagnosis not present

## 2022-02-06 DIAGNOSIS — I5032 Chronic diastolic (congestive) heart failure: Secondary | ICD-10-CM | POA: Diagnosis not present

## 2022-02-06 DIAGNOSIS — K8050 Calculus of bile duct without cholangitis or cholecystitis without obstruction: Secondary | ICD-10-CM | POA: Insufficient documentation

## 2022-02-06 DIAGNOSIS — Z9989 Dependence on other enabling machines and devices: Secondary | ICD-10-CM | POA: Diagnosis not present

## 2022-02-06 DIAGNOSIS — K219 Gastro-esophageal reflux disease without esophagitis: Secondary | ICD-10-CM | POA: Diagnosis not present

## 2022-02-06 DIAGNOSIS — Z87891 Personal history of nicotine dependence: Secondary | ICD-10-CM | POA: Insufficient documentation

## 2022-02-06 DIAGNOSIS — I739 Peripheral vascular disease, unspecified: Secondary | ICD-10-CM | POA: Insufficient documentation

## 2022-02-06 DIAGNOSIS — N183 Chronic kidney disease, stage 3 unspecified: Secondary | ICD-10-CM | POA: Insufficient documentation

## 2022-02-06 DIAGNOSIS — G4730 Sleep apnea, unspecified: Secondary | ICD-10-CM | POA: Diagnosis not present

## 2022-02-06 DIAGNOSIS — K319 Disease of stomach and duodenum, unspecified: Secondary | ICD-10-CM | POA: Diagnosis not present

## 2022-02-06 DIAGNOSIS — Z9689 Presence of other specified functional implants: Secondary | ICD-10-CM

## 2022-02-06 DIAGNOSIS — R7989 Other specified abnormal findings of blood chemistry: Secondary | ICD-10-CM

## 2022-02-06 DIAGNOSIS — I1310 Hypertensive heart and chronic kidney disease without heart failure, with stage 1 through stage 4 chronic kidney disease, or unspecified chronic kidney disease: Secondary | ICD-10-CM | POA: Insufficient documentation

## 2022-02-06 HISTORY — PX: BIOPSY: SHX5522

## 2022-02-06 HISTORY — PX: PANCREATIC STENT PLACEMENT: SHX5539

## 2022-02-06 HISTORY — PX: REMOVAL OF STONES: SHX5545

## 2022-02-06 HISTORY — PX: SPHINCTEROTOMY: SHX5279

## 2022-02-06 HISTORY — PX: BALLOON DILATION: SHX5330

## 2022-02-06 HISTORY — PX: ENDOSCOPIC RETROGRADE CHOLANGIOPANCREATOGRAPHY (ERCP) WITH PROPOFOL: SHX5810

## 2022-02-06 LAB — GLUCOSE, CAPILLARY: Glucose-Capillary: 141 mg/dL — ABNORMAL HIGH (ref 70–99)

## 2022-02-06 SURGERY — ENDOSCOPIC RETROGRADE CHOLANGIOPANCREATOGRAPHY (ERCP) WITH PROPOFOL
Anesthesia: General

## 2022-02-06 MED ORDER — SUGAMMADEX SODIUM 200 MG/2ML IV SOLN
INTRAVENOUS | Status: DC | PRN
  Administered 2022-02-06: 200 mg via INTRAVENOUS

## 2022-02-06 MED ORDER — LIDOCAINE 2% (20 MG/ML) 5 ML SYRINGE
INTRAMUSCULAR | Status: DC | PRN
  Administered 2022-02-06: 100 mg via INTRAVENOUS

## 2022-02-06 MED ORDER — APIXABAN 5 MG PO TABS: 5.0000 mg | ORAL_TABLET | Freq: Two times a day (BID) | ORAL | 6 refills | Status: DC

## 2022-02-06 MED ORDER — FENTANYL CITRATE (PF) 100 MCG/2ML IJ SOLN
INTRAMUSCULAR | Status: AC
  Filled 2022-02-06: qty 2

## 2022-02-06 MED ORDER — ONDANSETRON HCL 4 MG/2ML IJ SOLN
INTRAMUSCULAR | Status: DC | PRN
  Administered 2022-02-06: 4 mg via INTRAVENOUS

## 2022-02-06 MED ORDER — LACTATED RINGERS IV SOLN
INTRAVENOUS | Status: DC
  Administered 2022-02-06: 1000 mL via INTRAVENOUS

## 2022-02-06 MED ORDER — CIPROFLOXACIN IN D5W 400 MG/200ML IV SOLN
INTRAVENOUS | Status: AC
  Filled 2022-02-06: qty 200

## 2022-02-06 MED ORDER — CIPROFLOXACIN IN D5W 400 MG/200ML IV SOLN
INTRAVENOUS | Status: DC | PRN
  Administered 2022-02-06: 400 mg via INTRAVENOUS

## 2022-02-06 MED ORDER — INDOMETHACIN 50 MG RE SUPP
RECTAL | Status: DC | PRN
Start: 2022-02-06 — End: 2022-02-06
  Administered 2022-02-06: 100 mg via RECTAL

## 2022-02-06 MED ORDER — PROPOFOL 10 MG/ML IV BOLUS
INTRAVENOUS | Status: DC | PRN
  Administered 2022-02-06: 100 mg via INTRAVENOUS

## 2022-02-06 MED ORDER — GLUCAGON HCL RDNA (DIAGNOSTIC) 1 MG IJ SOLR
INTRAMUSCULAR | Status: AC
  Filled 2022-02-06: qty 2

## 2022-02-06 MED ORDER — PROPOFOL 10 MG/ML IV BOLUS
INTRAVENOUS | Status: AC
  Filled 2022-02-06: qty 20

## 2022-02-06 MED ORDER — SODIUM CHLORIDE 0.9 % IV SOLN
INTRAVENOUS | Status: DC | PRN
  Administered 2022-02-06: 40 mL

## 2022-02-06 MED ORDER — INDOMETHACIN 50 MG RE SUPP
RECTAL | Status: AC
Start: 2022-02-06 — End: ?
  Filled 2022-02-06: qty 2

## 2022-02-06 MED ORDER — ROCURONIUM BROMIDE 10 MG/ML (PF) SYRINGE
PREFILLED_SYRINGE | INTRAVENOUS | Status: DC | PRN
  Administered 2022-02-06: 80 mg via INTRAVENOUS

## 2022-02-06 MED ORDER — GLUCAGON HCL RDNA (DIAGNOSTIC) 1 MG IJ SOLR
INTRAMUSCULAR | Status: DC | PRN
  Administered 2022-02-06 (×3): .25 mg via INTRAVENOUS

## 2022-02-06 MED ORDER — DEXAMETHASONE SODIUM PHOSPHATE 10 MG/ML IJ SOLN
INTRAMUSCULAR | Status: DC | PRN
  Administered 2022-02-06: 10 mg via INTRAVENOUS

## 2022-02-06 MED ORDER — FENTANYL CITRATE (PF) 100 MCG/2ML IJ SOLN
INTRAMUSCULAR | Status: DC | PRN
  Administered 2022-02-06 (×2): 50 ug via INTRAVENOUS

## 2022-02-06 MED ORDER — PROPOFOL 1000 MG/100ML IV EMUL
INTRAVENOUS | Status: AC
Start: 2022-02-06 — End: ?
  Filled 2022-02-06: qty 100

## 2022-02-06 NOTE — Anesthesia Procedure Notes (Signed)
Procedure Name: Intubation ?Date/Time: 02/06/2022 10:04 AM ?Performed by: Sharlette Dense, CRNA ?Pre-anesthesia Checklist: Patient identified, Emergency Drugs available, Suction available and Patient being monitored ?Patient Re-evaluated:Patient Re-evaluated prior to induction ?Oxygen Delivery Method: Circle system utilized ?Preoxygenation: Pre-oxygenation with 100% oxygen ?Induction Type: IV induction ?Ventilation: Mask ventilation without difficulty and Oral airway inserted - appropriate to patient size ?Laryngoscope Size: Sabra Heck and 3 ?Grade View: Grade I ?Tube type: Oral ?Tube size: 7.5 mm ?Number of attempts: 1 ?Airway Equipment and Method: Stylet ?Placement Confirmation: ETT inserted through vocal cords under direct vision, positive ETCO2 and breath sounds checked- equal and bilateral ?Secured at: 23 cm ?Tube secured with: Tape ?Dental Injury: Teeth and Oropharynx as per pre-operative assessment  ? ? ? ? ?

## 2022-02-06 NOTE — Discharge Instructions (Signed)
YOU HAD AN ENDOSCOPIC PROCEDURE TODAY: Refer to the procedure report and other information in the discharge instructions given to you for any specific questions about what was found during the examination. If this information does not answer your questions, please call [place] office at [phone] to clarify.  ° °YOU SHOULD EXPECT: Some feelings of bloating in the abdomen. Passage of more gas than usual. Walking can help get rid of the air that was put into your GI tract during the procedure and reduce the bloating.  ° °DIET: Your first meal following the procedure should be a light meal and then it is ok to progress to your normal diet. A half-sandwich or bowl of soup is an example of a good first meal. Heavy or fried foods are harder to digest and may make you feel nauseous or bloated. Drink plenty of fluids but you should avoid alcoholic beverages for 24 hours. ° °ACTIVITY: Your care partner should take you home directly after the procedure. You should plan to take it easy, moving slowly for the rest of the day. You can resume normal activity the day after the procedure however YOU SHOULD NOT DRIVE, use power tools, machinery or perform tasks that involve climbing or major physical exertion for 24 hours (because of the sedation medicines used during the test).  ° °SYMPTOMS TO REPORT IMMEDIATELY: °A gastroenterologist can be reached at any hour. Please call [phone]  for any of the following symptoms:  ° °Following upper endoscopy (EGD, EUS, ERCP, esophageal dilation) °Vomiting of blood or coffee ground material  °New, significant abdominal pain  °New, significant chest pain or pain under the shoulder blades  °Painful or persistently difficult swallowing  °New shortness of breath  °Black, tarry-looking or red, bloody stools ° °FOLLOW UP:  °If any biopsies were taken you will be contacted by phone or by letter within the next 1-3 weeks. Call [phone]  if you have not heard about the biopsies in 3 weeks.    °Please also call with any specific questions about appointments or follow up tests. ° °

## 2022-02-06 NOTE — Telephone Encounter (Signed)
-----   Message from Irving Copas., MD sent at 02/06/2022 11:58 AM EDT ----- ?Hey team, ?Able to complete patient's ERCP.  This should optimize his LFTs.  We will have him come into our office in a couple of weeks to ensure his LFTs have normalized post ERCP with stone removal. ? ?CW, ?You performed his colectomy for his colon cancer few years back.  He has done well.  With that being said, I would like to see if you would be willing to see him again and discuss whether he may be a candidate for cholecystectomy in the setting of [name] having removed to biliary stones.  Interesting, that his gallbladder itself does not look to be significantly filled with stones but there is no doubt these were primary gallbladder stones.  He has a nice sphincterotomy in case he is not a surgical candidate to try to help [name] prevent having issues again in the future.  Appreciate you seeing him again for follow-up.  If you need a new referral since it is a new issue, let me know and my nurse can work on setting that referral up. ? ?Niccolas Loeper, please set up LFTs in 2 to 3 weeks. ? ?Thanks to all. ?GM ?

## 2022-02-06 NOTE — Transfer of Care (Signed)
Immediate Anesthesia Transfer of Care Note ? ?Patient: Troy Tuckey [name] ? ?Procedure(s) Performed: ENDOSCOPIC RETROGRADE CHOLANGIOPANCREATOGRAPHY (ERCP) WITH PROPOFOL ?SPHINCTEROTOMY ?REMOVAL OF STONES ?BIOPSY ?BALLOON Tamponade ampulla ?PANCREATIC STENT PLACEMENT ? ?Patient Location: Endoscopy Unit ? ?Anesthesia Type:General ? ?Level of Consciousness: awake ? ?Airway & Oxygen Therapy: Patient Spontanous Breathing and Patient connected to face mask oxygen ? ?Post-op Assessment: Report given to RN and Post -op Vital signs reviewed and stable ? ?Post vital signs: Reviewed and stable ? ?Last Vitals:  ?Vitals Value Taken Time  ?BP    ?Temp    ?Pulse 60 02/06/22 1149  ?Resp 12 02/06/22 1149  ?SpO2 98 % 02/06/22 1149  ?Vitals shown include unvalidated device data. ? ?Last Pain:  ?Vitals:  ? 02/06/22 0936  ?TempSrc: Oral  ?PainSc: 0-No pain  ?   ? ?  ? ?Complications: No notable events documented. ?

## 2022-02-06 NOTE — Telephone Encounter (Signed)
LFT order entered will call pt in 2-3 weeks  ?

## 2022-02-06 NOTE — Op Note (Signed)
[facility] ?Patient Name: Troy [name] ?Procedure Date: 02/06/2022 ?MRN: [id number] ?Attending MD: Justice Britain , MD ?Date of Birth: 10-06-1940 ?CSN: [id number] ?Age: 81 ?Admit Type: Outpatient ?Procedure:                ERCP ?Indications:              Bile duct stone(s), For therapy of bile duct  ?                          stone(s), Abnormal liver function test ?Providers:                Justice Britain, MD, Burtis Junes, RN, Hinton Dyer  ?                          Technician, Technician ?Referring MD:             Binnie Rail, MD, Sharon Mt. White MD, MD,  ?                          Izola Price Sherrill ?Medicines:                General Anesthesia, Cipro 400 mg IV, Indomethacin  ?                          100 mg PR, Glucagon 0.75 mg IV ?Complications:            No immediate complications. ?Estimated Blood Loss:     Estimated blood loss was minimal. ?Procedure:                Pre-Anesthesia Assessment: ?                          - Prior to the procedure, a History and Physical  ?                          was performed, and patient medications and  ?                          allergies were reviewed. The patient's tolerance of  ?                          previous anesthesia was also reviewed. The risks  ?                          and benefits of the procedure and the sedation  ?                          options and risks were discussed with the patient.  ?                          All questions were answered, and informed consent  ?                          was obtained. Prior Anticoagulants: The patient has  ?  taken Eliquis (apixaban), last dose was 2 days  ?                          prior to procedure. ASA Grade Assessment: III - A  ?                          patient with severe systemic disease. After  ?                          reviewing the risks and benefits, the patient was  ?                          deemed in satisfactory condition to undergo the  ?                           procedure. ?                          After obtaining informed consent, the scope was  ?                          passed under direct vision. Throughout the  ?                          procedure, the patient's blood pressure, pulse, and  ?                          oxygen saturations were monitored continuously. The  ?                          [id number]-[id number] ([id number]) Olympus duodenoscope was  ?                          introduced through the mouth, and used to inject  ?                          contrast into and used to inject contrast into the  ?                          bile duct and ventral pancreatic duct. The ERCP was  ?                          somewhat difficult due to challenging cannulation  ?                          and abnormal J-shaped stomach anatomy. Successful  ?                          completion of the procedure was aided by changing  ?                          the patient's position to left-lateral for duodenal  ?  intubation and performing the maneuvers documented  ?                          (below) in this report for cannulation. The patient  ?                          tolerated the procedure. ?Scope In: ?Scope Out: ?Findings: ?     The scout film was normal. ?     The scope was passed under direct vision through the upper GI tract. A  ?     J-shaped deformity was found of the stomach. A few small semi-sessile  ?     polyps were found in the cardia, in the gastric fundus and in the  ?     gastric body. Patchy mildly friable mucosa with no bleeding was found in  ?     the entire examined stomach - biopsied for HP evaluation. To enter into  ?     the duodenum, required left-lateral positioning. No gross mucosal  ?     lesions were noted in the duodenal bulb, in the first portion of the  ?     duodenum and in the second portion of the duodenum. The major papilla  ?     was on the rim of a diverticulum. The major papilla was small but  ?     otherwise normal. ?      The bile duct could not be cannulated on initial wire-guided attempt.  ?     First wire passage led to placement of the wire within the pancreatic  ?     duct. Decision was made to pursue a double-wire approach due to  ?     difficulty with even getting the wire to enter the small ampulla. The  ?     wire was left within the pancreatic duct. The bile duct could not be  ?     cannulated with the Hydratome sphincterotome and Revolution Jagtome  ?     sphincterotome using double-wire techniqu. Decision made ot place one 4  ?     Fr by 7 cm temporary plastic pancreatic stent with a single external  ?     pigtail was placed into the ventral pancreatic duct. The stent was in  ?     good position. ?     Now after going into a semi-long position (long-position not possible  ?     due to the J-shaped stomach deformity), a short 0.025 inch Revolution  ?     Antonietta Breach was passed into the biliary tree after having engaged the  ?     ampulla. The Revolution Jagtome sphincterotome was passed over the  ?     guidewire and the bile duct was then deeply cannulated. Contrast was  ?     injected. I personally interpreted the bile duct images. Ductal flow of  ?     contrast was adequate. Image quality was adequate. Contrast extended to  ?     the entire biliary tree. Opacification of the entire opacified area was  ?     successful. The lower third of the main bile duct contained filling  ?     defects thought to be stones. The main bile duct was moderately dilated.  ?     The largest diameter was 15 mm. A 10 mm biliary sphincterotomy  was made  ?     with a monofilament Revolution [place]  ?     electrocautery. There was no post-sphincterotomy bleeding. To discover  ?     objects, the biliary tree was swept with a retrieval balloon. Two stones  ?     were removed. No stones remained. Sphincterotomy bleeding from the apex  ?     of the cut was noted after the larger stone was removed through the  ?     orifice.  This continued for a few minutes. To aid in hemostasis, balloon  ?     tamponade was performed by use of dilation of the common bile duct with  ?     an 05-09-09 mm balloon (to a maximum balloon size of 10 mm) dilator for a  ?     total of 4 minutes. The balloon was released and the area monitored and  ?     there was no evidence of ongoing bleeding. An occlusion cholangiogram  ?     was performed that showed no further significant biliary pathology. We  ?     monitored the area for another 10 minutes and there was no evidence of  ?     ongoing oozing/bleeding from the sphincterotomy site. ?     A pancreatogram was not performed. ?     The duodenoscope was withdrawn from the patient. ?Impression:               - J-shaped deformity of stomach. A few gastric  ?                          polyps. Friable gastric mucosa. Stomach biopsied. ?                          - No gross mucosal lesions in the duodenal bulb, in  ?                          the first portion of the duodenum and in the second  ?                          portion of the duodenum. ?                          - The major papilla was small and on the rim of a  ?                          diverticulum. ?                          - Double wire technique used initially but  ?                          unsuccessful at biliary cannulation. ?                          - One temporary plastic pancreatic stent was placed  ?                          into the ventral  pancreatic duct to decrease PEP  ?                          but also to aid in biliary cannulation. ?                          - Eventually able to access the biliary tree as  ?                          noted with maneuvers above. ?                          - Filling defects consistent with stones were seen  ?                          on the cholangiogram. ?                          - The entire main bile duct was moderately dilated. ?                          - Choledocholithiasis was found. Complete removal   ?                          was accomplished by biliary sphincterotomy and  ?                          balloon trawl. ?                          - There was some active oozing from the  ?

## 2022-02-06 NOTE — Anesthesia Preprocedure Evaluation (Signed)
Anesthesia Evaluation  ?Patient identified by MRN, date of birth, ID band ?Patient awake ? ? ? ?Reviewed: ?Allergy & Precautions, NPO status , Patient's Chart, lab work & pertinent test results, reviewed documented beta blocker date and time  ? ?History of Anesthesia Complications ?Negative for: history of anesthetic complications ? ?Airway ?Mallampati: II ? ?TM Distance: >3 FB ?Neck ROM: Full ? ? ? Dental ? ?(+) Teeth Intact, Dental Advisory Given ?  ?Pulmonary ?sleep apnea and Continuous Positive Airway Pressure Ventilation , COPD, former smoker,  ?H/o lung cancer s/p lobectomy ?  ?Pulmonary exam normal ? ? ? ? ? ? ? Cardiovascular ?hypertension, Pt. on medications and Pt. on home beta blockers ?+ Peripheral Vascular Disease and +CHF  ?Normal cardiovascular exam+ dysrhythmias Atrial Fibrillation  ? ? ?Echo 07/02/21: EF 55-60%, unable to evaluate regional wall motion, mod LVH, mildly reduced RVSF, mild-mod TR ? ?Stress test 03/29/20: Stress EF 70%, no ST segment deviation during stress, normal, low risk study ? ?Cath 2018: No obstructive CAD ?  ?Neuro/Psych ?Anxiety Depression B/l carotid artery stenosis ?TIACVA   ? GI/Hepatic ?Neg liver ROS, GERD  ,CBD stone ?  ?Endo/Other  ?diabetes ? Renal/GU ?CRFRenal disease (CKD3, Cr 1.66)  ?negative genitourinary ?  ?Musculoskeletal ?negative musculoskeletal ROS ?(+)  ? Abdominal ?  ?Peds ? Hematology ?negative hematology ROS ?(+)   ?Anesthesia Other Findings ?Eliquis ? Reproductive/Obstetrics ? ?  ? ? ? ? ? ? ? ? ? ? ? ? ? ?  ?  ? ? ? ? ? ? ? ?Anesthesia Physical ?Anesthesia Plan ? ?ASA: 3 ? ?Anesthesia Plan: General  ? ?Post-op Pain Management: Minimal or no pain anticipated  ? ?Induction: Intravenous ? ?PONV Risk Score and Plan: 2 and Ondansetron, Dexamethasone, Treatment may vary due to age or medical condition and Midazolam ? ?Airway Management Planned: Oral ETT ? ?Additional Equipment: None ? ?Intra-op Plan:  ? ?Post-operative Plan:  Extubation in OR ? ?Informed Consent: I have reviewed the patients History and Physical, chart, labs and discussed the procedure including the risks, benefits and alternatives for the proposed anesthesia with the patient or authorized representative who has indicated his/her understanding and acceptance.  ? ? ? ?Dental advisory given ? ?Plan Discussed with:  ? ?Anesthesia Plan Comments:   ? ? ? ? ? ? ?Anesthesia Quick Evaluation ? ?

## 2022-02-06 NOTE — Telephone Encounter (Signed)
-----   Message from Irving Copas., MD sent at 02/06/2022 12:48 PM EDT ----- ?Regarding: Follow up Pancreatic Stent ?Waynette Towers, ?This patient needs KUB in 2-weeks with the LFTs for PD stent evaluation. ?Thanks. ?GM ? ?

## 2022-02-06 NOTE — H&P (Signed)
? ?GASTROENTEROLOGY PROCEDURE H&P NOTE  ? ?Primary Care Physician: ?Binnie Rail, MD ? ?HPI: ?Troy Francois [name] is a 81 y.o. male who presents for ERCP for abnormal MRI CP, choledocholithiasis. ? ?Past Medical History:  ?Diagnosis Date  ? AAA (abdominal aortic aneurysm) ([place]) LAST ABDOMINAL US 10-20-17 3.3 CM  ? MONITORED BY DR [name]  ? Allergy   ? Anxiety   ? Arthritis   ? Atrial fibrillation ([place])   ? Basal cell carcinoma   ? Bilateral carotid artery stenosis DUPLEX 12-29-2012  BY DR [facility]  ? BILATERAL ICA STENOSIS 60-79%  ? BPH (benign prostatic hypertrophy)   ? Cataract   ? removed both eyes  ? CHF (congestive heart failure) ([place])   ? Chronic diastolic heart failure ([place])   ? Chronic kidney disease   ? stage 3, pt unaware  ? Colon cancer ([place])   ? Complication of anesthesia   ? had nausea after carotid artery surgery, states the medicine given to help the nausea, made it worse  ? Constipation   ? COPD (chronic obstructive pulmonary disease) ([place])   ? Depression   ? Emphysema of lung ([place])   ? GERD (gastroesophageal reflux disease)   ? Glaucoma BOTH EYES  ? Dr Gershon Crane  ? History of basal cell carcinoma excision   ? History of lung cancer APRIL 2012  SQUAMOUS CELL---- S/P RIGHT LOWER LOBECTOMY AT [facility] --  NO CHEMORADIATION---  NO RECURRENCE   ? ONCOLOGIST- DR Patsi Sears IN [facility] 10-27-2012  ? History of pneumothorax   ? pt unaware  ? Hx of adenomatous colonic polyps 2005  ?  X 2; 1 hyperplastic polyp; Dr Olevia Perches  ? Hyperlipidemia   ? Hypertension   ? Impaired fasting glucose 2007  ? 108; A1c5.4%  ? Lesion of bladder   ? Lung cancer ([place]) 01/25/2012  ? Microhematuria   ? OSA (obstructive sleep apnea) 08/29/2015  ? CPAP SET ON 10  ? PAD (peripheral artery disease) ([place]) ABI'S  JAN 2014  0.65 ON RIGHT ;  1.04 ON LEFT  ? Peripheral vascular disease ([place]) S/P ANGIOPLASTY AND STENTING  ? FOLLOWED  BY DR [name]  ? Pleural effusion on right 11/18/2018  ? Chronic, noted on CXR  ? Sleep apnea   ? + cpap   ?  Spinal stenosis Sept. 2015  ? Status post placement of implantable loop recorder   ? Stroke [facility]) Jul 07, 2013  ? mini  TIA  ? Thoracic aorta atherosclerosis ([place])   ? 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 Urgency of urination   ? ?Past Surgical History:  ?Procedure Laterality Date  ? ANGIO PLASTY    ? X 4 in legs  ? AORTOGRAM  07-27-2002  ? MILD DIFFUSE ILIAC ARTERY OCCLUSIVE DISEASE /  LEFT RENAL ARTERY 20%/ PATENT LEFT FEM-POP GRAFT/ MILD SFA AND POPLITEAL ARTERY OCCLUSIVE DISEASE W/ SEVERE KIDNEY OCCLUSIVE DISEASE  ? ATRIAL FIBRILLATION ABLATION N/A 04/09/2019  ? Procedure: ATRIAL FIBRILLATION ABLATION;  Surgeon: Thompson Grayer, MD;  Location: [place] CV LAB;  Service: Cardiovascular;  Laterality: N/A;  ? ATRIAL FIBRILLATION ABLATION N/A 02/12/2020  ? Procedure: ATRIAL FIBRILLATION ABLATION;  Surgeon: Thompson Grayer, MD;  Location: [place] CV LAB;  Service: Cardiovascular;  Laterality: N/A;  ? BASAL CELL CARCINOMA EXCISION    ? MULTIPLE TIMES--  RIGHT FOREARM, CHEEKS, AND BACK  ? BIOPSY  06/25/2018  ? Procedure: BIOPSY;  Surgeon: Rush Landmark Telford Nab., MD;  Location: [place];  Service: Gastroenterology;;  ? CARDIOVASCULAR STRESS TEST  12-08-2012  DR MCLEAN  ? NORMAL LEXISCAN WITH NO EXERCISE NUCLEAR STUDY/ EF 66%/   NO ISCHEMIA/ NO SIGNIFICANT CHANGE FROM PRIOR STUDY  ? CARDIOVERSION N/A 02/14/2018  ? Procedure: CARDIOVERSION;  Surgeon: Larey Dresser, MD;  Location: [name] ENDOSCOPY;  Service: Cardiovascular;  Laterality: N/A;  ? CAROTID ANGIOGRAM N/A 07/10/2013  ? Procedure: CAROTID ANGIOGRAM;  Surgeon: Elam Dutch, MD;  Location: [facility] CATH LAB;  Service: Cardiovascular;  Laterality: N/A;  ? CAROTID ENDARTERECTOMY Right 07-14-13  ? cea  ? CATARACT EXTRACTION W/ INTRAOCULAR LENS  IMPLANT, BILATERAL    ? colon polyectomy    ? COLONOSCOPY    ? COLONOSCOPY WITH PROPOFOL N/A 06/25/2018  ? Procedure: COLONOSCOPY WITH PROPOFOL;  Surgeon: Mansouraty, Telford Nab., MD;  Location: [place];  Service:  Gastroenterology;  Laterality: N/A;  ? CYSTOSCOPY W/ RETROGRADES Bilateral 01/21/2013  ? Procedure: CYSTOSCOPY WITH RETROGRADE PYELOGRAM;  Surgeon: Molli Hazard, MD;  Location[facility];  Service: Urology;  Laterality: Bilateral;   ?CYSTO, BLADDER BIOPSY, BILATERAL RETROGRADE PYELOGRAM ? RAD TECH FROM RADIOLOGY PER JOY  ? CYSTOSCOPY WITH BIOPSY N/A 01/21/2013  ? Procedure: CYSTOSCOPY WITH BIOPSY;  Surgeon: Molli Hazard, MD;  Location: [facility];  Service: Urology;  Laterality: N/A;  ? ENDARTERECTOMY Right 07/14/2013  ? Procedure: ENDARTERECTOMY CAROTID;  Surgeon: Angelia Mould, MD;  Location: [place];  Service: Vascular;  Laterality: Right;  ? EP IMPLANTABLE DEVICE N/A 08/12/2015  ? Procedure: Loop Recorder Insertion;  Surgeon: Thompson Grayer, MD;  Location: [place] CV LAB;  Service: Cardiovascular;  Laterality: N/A;  ? ESOPHAGOGASTRODUODENOSCOPY (EGD) WITH PROPOFOL N/A 06/25/2018  ? Procedure: ESOPHAGOGASTRODUODENOSCOPY (EGD) WITH PROPOFOL;  Surgeon: Rush Landmark Telford Nab., MD;  Location: [place];  Service: Gastroenterology;  Laterality: N/A;  ? ESOPHAGOGASTRODUODENOSCOPY (EGD) WITH PROPOFOL N/A 07/10/2018  ? Procedure: ESOPHAGOGASTRODUODENOSCOPY (EGD) WITH PROPOFOL;  Surgeon: Milus Banister, MD;  Location: [facility] ENDOSCOPY;  Service: Endoscopy;  Laterality: N/A;  ? EUS N/A 07/10/2018  ? Procedure: UPPER ENDOSCOPIC ULTRASOUND (EUS) RADIAL;  Surgeon: Milus Banister, MD;  Location: [facility] ENDOSCOPY;  Service: Endoscopy;  Laterality: N/A;  ? EYE SURGERY Right   ? FEMORAL-POPLITEAL BYPASS GRAFT [facility]  ? AND 2001  IN [place]  ? FEMORAL-POPLITEAL BYPASS GRAFT Left 08/30/2015  ? Procedure: REVISION OF BYPASS GRAFT Left  FEMORAL-POPLITEAL ARTERY;  Surgeon: Angelia Mould, MD;  Location: [place];  Service: Vascular;  Laterality: Left;  ? FINE NEEDLE ASPIRATION N/A 07/10/2018  ? Procedure: FINE NEEDLE ASPIRATION (FNA) LINEAR;  Surgeon: Milus Banister, MD;  Location: [facility] ENDOSCOPY;  Service: Endoscopy;  Laterality: N/A;  ? FLEXIBLE SIGMOIDOSCOPY N/A 12/12/2018  ? Procedure: FLEXIBLE SIGMOIDOSCOPY;  Surgeon: Ileana Roup, MD;  Location: [facility] ORS;  Service: General;  Laterality: N/A;  ? LARYNGOSCOPY  06-27-2004  ? BX VOCAL CORD  (LEUKOPLAKIA)  PER PT NO ISSUES SINCE  ? LEFT HEART CATH AND CORONARY ANGIOGRAPHY N/A 08/20/2017  ? Procedure: LEFT HEART CATH AND CORONARY ANGIOGRAPHY;  Surgeon: Larey Dresser, MD;  Location: [place] CV LAB;  Service: Cardiovascular;  Laterality: N/A;  ? LOOP RECORDER INSERTION N/A 04/09/2019  ? Procedure: LOOP RECORDER INSERTION;  Surgeon: Thompson Grayer, MD;  Location: [place] CV LAB;  Service: Cardiovascular;  Laterality: N/A;  ? LOOP RECORDER REMOVAL N/A 04/09/2019  ? Procedure: LOOP RECORDER REMOVAL;  Surgeon: Thompson Grayer, MD;  Location: [place] CV LAB;  Service:  Cardiovascular;  Laterality: N/A;  ? LOWER EXTREMITY ANGIOGRAM Bilateral 08/29/2015  ? Procedure: Lower Extremity Angiogram;  Surgeon: Angelia Mould, MD;  Location: [place] CV LAB;  Service: Cardiovascular;  Laterality: Bilateral;  ? LUNG LOBECTOMY  01/24/2011  ?  RIGHT UPPER LOBE  (SQUAMOUS CELL CARCINOMA) Dr Dorthea Cove , [facility]. No chemotherapyor radiation  ? PATCH ANGIOPLASTY Right 07/14/2013  ? Procedure: PATCH ANGIOPLASTY;  Surgeon: Angelia Mould, MD;  Location: [place];  Service: Vascular;  Laterality: Right;  ? PATCH ANGIOPLASTY Left 08/30/2015  ? Procedure: VEIN PATCH ANGIOPLASTY OF PROXIMAL Left BYPASS GRAFT;  Surgeon: Angelia Mould, MD;  Location: [place];  Service: Vascular;  Laterality: Left;  ? PERIPHERAL VASCULAR CATHETERIZATION N/A 08/29/2015  ? Procedure: Abdominal Aortogram;  Surgeon: Angelia Mould, MD;  Location: [place] CV LAB;  Service: Cardiovascular;  Laterality: N/A;  ? POLYPECTOMY  06/25/2018  ? Procedure: POLYPECTOMY;  Surgeon: Mansouraty, Telford Nab., MD;  Location: [place];  Service:  Gastroenterology;;  ? POLYPECTOMY    ? SUBMUCOSAL INJECTION  06/25/2018  ? Procedure: SUBMUCOSAL INJECTION;  Surgeon: Irving Copas., MD;  Location: [place];  Service: Gastroenterology;;  ? TEE WITHOUT C

## 2022-02-06 NOTE — Anesthesia Postprocedure Evaluation (Signed)
Anesthesia Post Note ? ?Patient: Cayle Thunder [name] ? ?Procedure(s) Performed: ENDOSCOPIC RETROGRADE CHOLANGIOPANCREATOGRAPHY (ERCP) WITH PROPOFOL ?SPHINCTEROTOMY ?REMOVAL OF STONES ?BIOPSY ?BALLOON Tamponade ampulla ?PANCREATIC STENT PLACEMENT ? ?  ? ?Patient location during evaluation: Endoscopy ?Anesthesia Type: General ?Level of consciousness: awake and alert ?Pain management: pain level controlled ?Vital Signs Assessment: post-procedure vital signs reviewed and stable ?Respiratory status: spontaneous breathing, nonlabored ventilation and respiratory function stable ?Cardiovascular status: blood pressure returned to baseline and stable ?Postop Assessment: no apparent nausea or vomiting ?Anesthetic complications: no ? ? ?No notable events documented. ? ?Last Vitals:  ?Vitals:  ? 02/06/22 1217 02/06/22 1220  ?BP: (!) 120/50 (!) 120/50  ?Pulse: (!) 53 (!) 54  ?Resp: 11 12  ?Temp:    ?SpO2: 93% 93%  ?  ?Last Pain:  ?Vitals:  ? 02/06/22 1217  ?TempSrc:   ?PainSc: 0-No pain  ? ? ?  ?  ?  ?  ?  ?  ? ?Lidia Collum ? ? ? ? ?

## 2022-02-07 ENCOUNTER — Encounter: Payer: Self-pay | Admitting: Gastroenterology

## 2022-02-07 LAB — SURGICAL PATHOLOGY

## 2022-02-08 ENCOUNTER — Encounter (HOSPITAL_COMMUNITY): Payer: Self-pay | Admitting: Gastroenterology

## 2022-02-08 NOTE — Progress Notes (Signed)
Carelink Summary Report / Loop Recorder 

## 2022-02-09 ENCOUNTER — Other Ambulatory Visit (HOSPITAL_COMMUNITY): Payer: Self-pay | Admitting: Cardiology

## 2022-02-12 ENCOUNTER — Inpatient Hospital Stay (HOSPITAL_COMMUNITY)
Admission: EM | Admit: 2022-02-12 | Discharge: 2022-02-16 | DRG: 377 | Disposition: A | Discharge status: Skilled Nursing Facility | Payer: PPO | Attending: Internal Medicine | Admitting: Internal Medicine

## 2022-02-12 ENCOUNTER — Emergency Department (HOSPITAL_COMMUNITY): Payer: PPO

## 2022-02-12 ENCOUNTER — Encounter (HOSPITAL_COMMUNITY): Payer: Self-pay | Admitting: Internal Medicine

## 2022-02-12 ENCOUNTER — Other Ambulatory Visit: Payer: Self-pay

## 2022-02-12 DIAGNOSIS — E785 Hyperlipidemia, unspecified: Secondary | ICD-10-CM | POA: Diagnosis present

## 2022-02-12 DIAGNOSIS — H409 Unspecified glaucoma: Secondary | ICD-10-CM | POA: Diagnosis present

## 2022-02-12 DIAGNOSIS — I480 Paroxysmal atrial fibrillation: Secondary | ICD-10-CM | POA: Diagnosis present

## 2022-02-12 DIAGNOSIS — N1832 Chronic kidney disease, stage 3b: Secondary | ICD-10-CM | POA: Diagnosis present

## 2022-02-12 DIAGNOSIS — K2970 Gastritis, unspecified, without bleeding: Secondary | ICD-10-CM | POA: Diagnosis not present

## 2022-02-12 DIAGNOSIS — K219 Gastro-esophageal reflux disease without esophagitis: Secondary | ICD-10-CM | POA: Diagnosis present

## 2022-02-12 DIAGNOSIS — Z85828 Personal history of other malignant neoplasm of skin: Secondary | ICD-10-CM

## 2022-02-12 DIAGNOSIS — Z880 Allergy status to penicillin: Secondary | ICD-10-CM

## 2022-02-12 DIAGNOSIS — K5710 Diverticulosis of small intestine without perforation or abscess without bleeding: Secondary | ICD-10-CM

## 2022-02-12 DIAGNOSIS — S0181XA Laceration without foreign body of other part of head, initial encounter: Secondary | ICD-10-CM | POA: Diagnosis present

## 2022-02-12 DIAGNOSIS — D638 Anemia in other chronic diseases classified elsewhere: Secondary | ICD-10-CM | POA: Diagnosis present

## 2022-02-12 DIAGNOSIS — I959 Hypotension, unspecified: Secondary | ICD-10-CM | POA: Diagnosis not present

## 2022-02-12 DIAGNOSIS — Z66 Do not resuscitate: Secondary | ICD-10-CM | POA: Diagnosis not present

## 2022-02-12 DIAGNOSIS — Z85118 Personal history of other malignant neoplasm of bronchus and lung: Secondary | ICD-10-CM

## 2022-02-12 DIAGNOSIS — Z7901 Long term (current) use of anticoagulants: Secondary | ICD-10-CM

## 2022-02-12 DIAGNOSIS — R195 Other fecal abnormalities: Principal | ICD-10-CM | POA: Diagnosis present

## 2022-02-12 DIAGNOSIS — F32A Depression, unspecified: Secondary | ICD-10-CM | POA: Diagnosis present

## 2022-02-12 DIAGNOSIS — Z888 Allergy status to other drugs, medicaments and biological substances status: Secondary | ICD-10-CM

## 2022-02-12 DIAGNOSIS — E1351 Other specified diabetes mellitus with diabetic peripheral angiopathy without gangrene: Secondary | ICD-10-CM

## 2022-02-12 DIAGNOSIS — Z902 Acquired absence of lung [part of]: Secondary | ICD-10-CM

## 2022-02-12 DIAGNOSIS — R41841 Cognitive communication deficit: Secondary | ICD-10-CM | POA: Diagnosis not present

## 2022-02-12 DIAGNOSIS — N400 Enlarged prostate without lower urinary tract symptoms: Secondary | ICD-10-CM | POA: Diagnosis present

## 2022-02-12 DIAGNOSIS — D649 Anemia, unspecified: Secondary | ICD-10-CM | POA: Diagnosis present

## 2022-02-12 DIAGNOSIS — I130 Hypertensive heart and chronic kidney disease with heart failure and stage 1 through stage 4 chronic kidney disease, or unspecified chronic kidney disease: Secondary | ICD-10-CM | POA: Diagnosis present

## 2022-02-12 DIAGNOSIS — K921 Melena: Secondary | ICD-10-CM | POA: Diagnosis not present

## 2022-02-12 DIAGNOSIS — E1122 Type 2 diabetes mellitus with diabetic chronic kidney disease: Secondary | ICD-10-CM | POA: Diagnosis present

## 2022-02-12 DIAGNOSIS — F419 Anxiety disorder, unspecified: Secondary | ICD-10-CM | POA: Diagnosis present

## 2022-02-12 DIAGNOSIS — I5032 Chronic diastolic (congestive) heart failure: Secondary | ICD-10-CM | POA: Diagnosis present

## 2022-02-12 DIAGNOSIS — Y92009 Unspecified place in unspecified non-institutional (private) residence as the place of occurrence of the external cause: Secondary | ICD-10-CM | POA: Diagnosis not present

## 2022-02-12 DIAGNOSIS — R079 Chest pain, unspecified: Secondary | ICD-10-CM | POA: Diagnosis not present

## 2022-02-12 DIAGNOSIS — G4733 Obstructive sleep apnea (adult) (pediatric): Secondary | ICD-10-CM | POA: Diagnosis present

## 2022-02-12 DIAGNOSIS — M6281 Muscle weakness (generalized): Secondary | ICD-10-CM | POA: Diagnosis not present

## 2022-02-12 DIAGNOSIS — R58 Hemorrhage, not elsewhere classified: Secondary | ICD-10-CM | POA: Diagnosis not present

## 2022-02-12 DIAGNOSIS — K317 Polyp of stomach and duodenum: Secondary | ICD-10-CM | POA: Diagnosis not present

## 2022-02-12 DIAGNOSIS — K264 Chronic or unspecified duodenal ulcer with hemorrhage: Principal | ICD-10-CM | POA: Diagnosis present

## 2022-02-12 DIAGNOSIS — Z23 Encounter for immunization: Secondary | ICD-10-CM

## 2022-02-12 DIAGNOSIS — W19XXXA Unspecified fall, initial encounter: Secondary | ICD-10-CM | POA: Diagnosis present

## 2022-02-12 DIAGNOSIS — N183 Chronic kidney disease, stage 3 unspecified: Secondary | ICD-10-CM | POA: Diagnosis not present

## 2022-02-12 DIAGNOSIS — E1151 Type 2 diabetes mellitus with diabetic peripheral angiopathy without gangrene: Secondary | ICD-10-CM | POA: Diagnosis present

## 2022-02-12 DIAGNOSIS — R42 Dizziness and giddiness: Secondary | ICD-10-CM | POA: Diagnosis not present

## 2022-02-12 DIAGNOSIS — M4802 Spinal stenosis, cervical region: Secondary | ICD-10-CM | POA: Diagnosis not present

## 2022-02-12 DIAGNOSIS — D62 Acute posthemorrhagic anemia: Secondary | ICD-10-CM

## 2022-02-12 DIAGNOSIS — I4891 Unspecified atrial fibrillation: Secondary | ICD-10-CM | POA: Diagnosis not present

## 2022-02-12 DIAGNOSIS — R2689 Other abnormalities of gait and mobility: Secondary | ICD-10-CM | POA: Diagnosis not present

## 2022-02-12 DIAGNOSIS — M1990 Unspecified osteoarthritis, unspecified site: Secondary | ICD-10-CM | POA: Diagnosis present

## 2022-02-12 DIAGNOSIS — Z881 Allergy status to other antibiotic agents status: Secondary | ICD-10-CM

## 2022-02-12 DIAGNOSIS — Z87891 Personal history of nicotine dependence: Secondary | ICD-10-CM

## 2022-02-12 DIAGNOSIS — Z79899 Other long term (current) drug therapy: Secondary | ICD-10-CM

## 2022-02-12 DIAGNOSIS — J449 Chronic obstructive pulmonary disease, unspecified: Secondary | ICD-10-CM | POA: Diagnosis present

## 2022-02-12 DIAGNOSIS — I714 Abdominal aortic aneurysm, without rupture, unspecified: Secondary | ICD-10-CM | POA: Diagnosis present

## 2022-02-12 DIAGNOSIS — Z8249 Family history of ischemic heart disease and other diseases of the circulatory system: Secondary | ICD-10-CM

## 2022-02-12 DIAGNOSIS — Z743 Need for continuous supervision: Secondary | ICD-10-CM | POA: Diagnosis not present

## 2022-02-12 DIAGNOSIS — C3490 Malignant neoplasm of unspecified part of unspecified bronchus or lung: Secondary | ICD-10-CM | POA: Diagnosis not present

## 2022-02-12 DIAGNOSIS — I10 Essential (primary) hypertension: Secondary | ICD-10-CM | POA: Diagnosis not present

## 2022-02-12 DIAGNOSIS — K922 Gastrointestinal hemorrhage, unspecified: Secondary | ICD-10-CM | POA: Diagnosis not present

## 2022-02-12 DIAGNOSIS — Z7951 Long term (current) use of inhaled steroids: Secondary | ICD-10-CM

## 2022-02-12 DIAGNOSIS — S0181XS Laceration without foreign body of other part of head, sequela: Secondary | ICD-10-CM | POA: Diagnosis not present

## 2022-02-12 DIAGNOSIS — R262 Difficulty in walking, not elsewhere classified: Secondary | ICD-10-CM | POA: Diagnosis not present

## 2022-02-12 DIAGNOSIS — Z961 Presence of intraocular lens: Secondary | ICD-10-CM | POA: Diagnosis present

## 2022-02-12 DIAGNOSIS — K5711 Diverticulosis of small intestine without perforation or abscess with bleeding: Secondary | ICD-10-CM | POA: Diagnosis not present

## 2022-02-12 DIAGNOSIS — R0602 Shortness of breath: Secondary | ICD-10-CM | POA: Diagnosis not present

## 2022-02-12 DIAGNOSIS — J9601 Acute respiratory failure with hypoxia: Secondary | ICD-10-CM | POA: Diagnosis not present

## 2022-02-12 DIAGNOSIS — K2971 Gastritis, unspecified, with bleeding: Secondary | ICD-10-CM | POA: Diagnosis not present

## 2022-02-12 DIAGNOSIS — Z7984 Long term (current) use of oral hypoglycemic drugs: Secondary | ICD-10-CM

## 2022-02-12 DIAGNOSIS — E119 Type 2 diabetes mellitus without complications: Secondary | ICD-10-CM | POA: Diagnosis not present

## 2022-02-12 DIAGNOSIS — S0990XA Unspecified injury of head, initial encounter: Secondary | ICD-10-CM | POA: Diagnosis not present

## 2022-02-12 DIAGNOSIS — E538 Deficiency of other specified B group vitamins: Secondary | ICD-10-CM | POA: Diagnosis not present

## 2022-02-12 DIAGNOSIS — I739 Peripheral vascular disease, unspecified: Secondary | ICD-10-CM | POA: Diagnosis not present

## 2022-02-12 DIAGNOSIS — J9811 Atelectasis: Secondary | ICD-10-CM | POA: Diagnosis not present

## 2022-02-12 DIAGNOSIS — J90 Pleural effusion, not elsewhere classified: Secondary | ICD-10-CM | POA: Diagnosis not present

## 2022-02-12 DIAGNOSIS — H548 Legal blindness, as defined in USA: Secondary | ICD-10-CM | POA: Diagnosis present

## 2022-02-12 DIAGNOSIS — Z882 Allergy status to sulfonamides status: Secondary | ICD-10-CM

## 2022-02-12 DIAGNOSIS — R531 Weakness: Secondary | ICD-10-CM | POA: Diagnosis not present

## 2022-02-12 DIAGNOSIS — K269 Duodenal ulcer, unspecified as acute or chronic, without hemorrhage or perforation: Secondary | ICD-10-CM | POA: Diagnosis not present

## 2022-02-12 DIAGNOSIS — R55 Syncope and collapse: Secondary | ICD-10-CM | POA: Diagnosis not present

## 2022-02-12 DIAGNOSIS — Z85038 Personal history of other malignant neoplasm of large intestine: Secondary | ICD-10-CM

## 2022-02-12 DIAGNOSIS — E876 Hypokalemia: Secondary | ICD-10-CM | POA: Diagnosis not present

## 2022-02-12 DIAGNOSIS — R2681 Unsteadiness on feet: Secondary | ICD-10-CM | POA: Diagnosis not present

## 2022-02-12 DIAGNOSIS — I700 Atherosclerosis of aorta: Secondary | ICD-10-CM | POA: Diagnosis present

## 2022-02-12 LAB — CBC WITH DIFFERENTIAL/PLATELET
Abs Immature Granulocytes: 0.06 10*3/uL (ref 0.00–0.07)
Basophils Absolute: 0 10*3/uL (ref 0.0–0.1)
Basophils Relative: 0 %
Eosinophils Absolute: 0 10*3/uL (ref 0.0–0.5)
Eosinophils Relative: 0 %
HCT: 26.3 % — ABNORMAL LOW (ref 39.0–52.0)
Hemoglobin: 8.6 g/dL — ABNORMAL LOW (ref 13.0–17.0)
Immature Granulocytes: 1 %
Lymphocytes Relative: 8 %
Lymphs Abs: 0.6 10*3/uL — ABNORMAL LOW (ref 0.7–4.0)
MCH: 31.5 pg (ref 26.0–34.0)
MCHC: 32.7 g/dL (ref 30.0–36.0)
MCV: 96.3 fL (ref 80.0–100.0)
Monocytes Absolute: 0.3 10*3/uL (ref 0.1–1.0)
Monocytes Relative: 4 %
Neutro Abs: 6.3 10*3/uL (ref 1.7–7.7)
Neutrophils Relative %: 87 %
Platelets: 254 10*3/uL (ref 150–400)
RBC: 2.73 MIL/uL — ABNORMAL LOW (ref 4.22–5.81)
RDW: 16.9 % — ABNORMAL HIGH (ref 11.5–15.5)
WBC: 7.3 10*3/uL (ref 4.0–10.5)
nRBC: 0 % (ref 0.0–0.2)

## 2022-02-12 LAB — URINALYSIS, ROUTINE W REFLEX MICROSCOPIC
Bilirubin Urine: NEGATIVE
Glucose, UA: >=500 mg/dL — AB
Hgb urine dipstick: NEGATIVE
Ketones, ur: NEGATIVE mg/dL
Leukocytes,Ua: NEGATIVE
Nitrite: NEGATIVE
Protein, ur: NEGATIVE mg/dL
Specific Gravity, Urine: <1.005 — ABNORMAL LOW (ref 1.005–1.030)
pH: 6 (ref 5.0–8.0)

## 2022-02-12 LAB — IRON AND TIBC
Iron: 62 ug/dL (ref 45–182)
Saturation Ratios: 19 % (ref 17.9–39.5)
TIBC: 322 ug/dL (ref 250–450)
UIBC: 260 ug/dL

## 2022-02-12 LAB — COMPREHENSIVE METABOLIC PANEL
ALT: 19 U/L (ref 0–44)
AST: 28 U/L (ref 15–41)
Albumin: 3.1 g/dL — ABNORMAL LOW (ref 3.5–5.0)
Alkaline Phosphatase: 84 U/L (ref 38–126)
Anion gap: 12 (ref 5–15)
BUN: 46 mg/dL — ABNORMAL HIGH (ref 8–23)
CO2: 20 mmol/L — ABNORMAL LOW (ref 22–32)
Calcium: 8.6 mg/dL — ABNORMAL LOW (ref 8.9–10.3)
Chloride: 105 mmol/L (ref 98–111)
Creatinine, Ser: 1.83 mg/dL — ABNORMAL HIGH (ref 0.61–1.24)
GFR, Estimated: 37 mL/min — ABNORMAL LOW (ref >60)
Glucose, Bld: 216 mg/dL — ABNORMAL HIGH (ref 70–99)
Potassium: 4.4 mmol/L (ref 3.5–5.1)
Sodium: 137 mmol/L (ref 135–145)
Total Bilirubin: 1 mg/dL (ref 0.3–1.2)
Total Protein: 5.5 g/dL — ABNORMAL LOW (ref 6.5–8.1)

## 2022-02-12 LAB — URINALYSIS, MICROSCOPIC (REFLEX)

## 2022-02-12 LAB — RETICULOCYTES
Immature Retic Fract: 35.3 % — ABNORMAL HIGH (ref 2.3–15.9)
RBC.: 2.52 MIL/uL — ABNORMAL LOW (ref 4.22–5.81)
Retic Count, Absolute: 122.2 10*3/uL (ref 19.0–186.0)
Retic Ct Pct: 4.9 % — ABNORMAL HIGH (ref 0.4–3.1)

## 2022-02-12 LAB — POC OCCULT BLOOD, ED: Fecal Occult Bld: POSITIVE — AB

## 2022-02-12 LAB — HEMOGLOBIN: Hemoglobin: 7.8 g/dL — ABNORMAL LOW (ref 13.0–17.0)

## 2022-02-12 LAB — VITAMIN B12: Vitamin B-12: 166 pg/mL — ABNORMAL LOW (ref 180–914)

## 2022-02-12 LAB — FERRITIN: Ferritin: 85 ng/mL (ref 24–336)

## 2022-02-12 LAB — FOLATE: Folate: 12.2 ng/mL (ref >5.9)

## 2022-02-12 MED ORDER — MORPHINE SULFATE (PF) 2 MG/ML IV SOLN: 2.0000 mg | INTRAVENOUS | Status: DC | PRN

## 2022-02-12 MED ORDER — PANTOPRAZOLE SODIUM 40 MG IV SOLR
40.0000 mg | Freq: Once | INTRAVENOUS | Status: AC
  Administered 2022-02-12: 40 mg via INTRAVENOUS
  Filled 2022-02-12: qty 10

## 2022-02-12 MED ORDER — ACETAMINOPHEN 650 MG RE SUPP
650.0000 mg | Freq: Four times a day (QID) | RECTAL | Status: DC | PRN
Start: 2022-02-12 — End: 2022-02-16

## 2022-02-12 MED ORDER — ARFORMOTEROL TARTRATE 15 MCG/2ML IN NEBU
15.0000 ug | INHALATION_SOLUTION | Freq: Two times a day (BID) | RESPIRATORY_TRACT | Status: DC
  Administered 2022-02-12 – 2022-02-16 (×8): 15 ug via RESPIRATORY_TRACT
  Filled 2022-02-12 (×8): qty 2

## 2022-02-12 MED ORDER — LACTATED RINGERS IV BOLUS
500.0000 mL | Freq: Once | INTRAVENOUS | Status: AC
  Administered 2022-02-12: 500 mL via INTRAVENOUS

## 2022-02-12 MED ORDER — ACETAMINOPHEN 325 MG PO TABS
650.0000 mg | ORAL_TABLET | Freq: Four times a day (QID) | ORAL | Status: DC | PRN
  Administered 2022-02-12 – 2022-02-15 (×5): 650 mg via ORAL
  Filled 2022-02-12 (×5): qty 2

## 2022-02-12 MED ORDER — UMECLIDINIUM BROMIDE 62.5 MCG/ACT IN AEPB
1.0000 | INHALATION_SPRAY | Freq: Every day | RESPIRATORY_TRACT | Status: DC
  Administered 2022-02-13 – 2022-02-16 (×4): 1 via RESPIRATORY_TRACT
  Filled 2022-02-12: qty 7

## 2022-02-12 MED ORDER — ALBUTEROL SULFATE (2.5 MG/3ML) 0.083% IN NEBU: 3.0000 mL | INHALATION_SOLUTION | Freq: Four times a day (QID) | RESPIRATORY_TRACT | Status: DC | PRN

## 2022-02-12 MED ORDER — MELATONIN 10 MG PO TABS: 20.0000 mg | ORAL_TABLET | Freq: Every day | ORAL | Status: DC

## 2022-02-12 MED ORDER — BRIMONIDINE TARTRATE 0.2 % OP SOLN
1.0000 [drp] | Freq: Three times a day (TID) | OPHTHALMIC | Status: DC
  Administered 2022-02-13 – 2022-02-16 (×9): 1 [drp] via OPHTHALMIC
  Filled 2022-02-12: qty 5

## 2022-02-12 MED ORDER — BUDESON-GLYCOPYRROL-FORMOTEROL 160-9-4.8 MCG/ACT IN AERO: 2.0000 | INHALATION_SPRAY | Freq: Two times a day (BID) | RESPIRATORY_TRACT | Status: DC

## 2022-02-12 MED ORDER — BUDESONIDE 0.25 MG/2ML IN SUSP
0.2500 mg | Freq: Two times a day (BID) | RESPIRATORY_TRACT | Status: DC
Start: 2022-02-12 — End: 2022-02-16
  Administered 2022-02-12 – 2022-02-16 (×8): 0.25 mg via RESPIRATORY_TRACT
  Filled 2022-02-12 (×8): qty 2

## 2022-02-12 MED ORDER — ICOSAPENT ETHYL 1 G PO CAPS
2.0000 g | ORAL_CAPSULE | Freq: Two times a day (BID) | ORAL | Status: DC
  Administered 2022-02-12 – 2022-02-16 (×8): 2 g via ORAL
  Filled 2022-02-12 (×9): qty 2

## 2022-02-12 MED ORDER — BRINZOLAMIDE 1 % OP SUSP
1.0000 [drp] | Freq: Three times a day (TID) | OPHTHALMIC | Status: DC
Start: 2022-02-12 — End: 2022-02-16
  Administered 2022-02-13 – 2022-02-16 (×9): 1 [drp] via OPHTHALMIC
  Filled 2022-02-12: qty 10

## 2022-02-12 MED ORDER — TETANUS-DIPHTH-ACELL PERTUSSIS 5-2.5-18.5 LF-MCG/0.5 IM SUSY
0.5000 mL | PREFILLED_SYRINGE | Freq: Once | INTRAMUSCULAR | Status: AC
Start: 2022-02-12 — End: 2022-02-12
  Administered 2022-02-12: 0.5 mL via INTRAMUSCULAR
  Filled 2022-02-12: qty 0.5

## 2022-02-12 MED ORDER — METOPROLOL SUCCINATE ER 25 MG PO TB24
12.5000 mg | ORAL_TABLET | Freq: Every day | ORAL | Status: DC
Start: 2022-02-12 — End: 2022-02-16
  Administered 2022-02-13 – 2022-02-15 (×3): 12.5 mg via ORAL
  Filled 2022-02-12 (×4): qty 1

## 2022-02-12 MED ORDER — ONDANSETRON HCL 4 MG PO TABS: 4.0000 mg | ORAL_TABLET | Freq: Four times a day (QID) | ORAL | Status: DC | PRN

## 2022-02-12 MED ORDER — PANTOPRAZOLE SODIUM 40 MG IV SOLR
40.0000 mg | Freq: Two times a day (BID) | INTRAVENOUS | Status: DC
  Administered 2022-02-12: 40 mg via INTRAVENOUS
  Filled 2022-02-12: qty 10

## 2022-02-12 MED ORDER — LATANOPROST 0.005 % OP SOLN
1.0000 [drp] | Freq: Every day | OPHTHALMIC | Status: DC
  Administered 2022-02-12 – 2022-02-15 (×4): 1 [drp] via OPHTHALMIC
  Filled 2022-02-12: qty 2.5

## 2022-02-12 MED ORDER — ESCITALOPRAM OXALATE 10 MG PO TABS
10.0000 mg | ORAL_TABLET | Freq: Every day | ORAL | Status: DC
  Administered 2022-02-13 – 2022-02-16 (×4): 10 mg via ORAL
  Filled 2022-02-12 (×4): qty 1

## 2022-02-12 MED ORDER — LACTATED RINGERS IV SOLN: INTRAVENOUS | Status: DC

## 2022-02-12 MED ORDER — BACITRACIN ZINC 500 UNIT/GM EX OINT
TOPICAL_OINTMENT | Freq: Once | CUTANEOUS | Status: AC
  Administered 2022-02-12: 1 via TOPICAL
  Filled 2022-02-12: qty 0.9

## 2022-02-12 MED ORDER — ONDANSETRON HCL 4 MG/2ML IJ SOLN: 4.0000 mg | Freq: Four times a day (QID) | INTRAMUSCULAR | Status: DC | PRN

## 2022-02-12 MED ORDER — MONTELUKAST SODIUM 10 MG PO TABS
10.0000 mg | ORAL_TABLET | Freq: Every day | ORAL | Status: DC
  Administered 2022-02-12 – 2022-02-15 (×4): 10 mg via ORAL
  Filled 2022-02-12 (×4): qty 1

## 2022-02-12 MED ORDER — ROSUVASTATIN CALCIUM 20 MG PO TABS
40.0000 mg | ORAL_TABLET | Freq: Every day | ORAL | Status: DC
Start: 2022-02-13 — End: 2022-02-16
  Administered 2022-02-13 – 2022-02-16 (×4): 40 mg via ORAL
  Filled 2022-02-12 (×4): qty 2

## 2022-02-12 MED ORDER — HYDRALAZINE HCL 20 MG/ML IJ SOLN: 5.0000 mg | INTRAMUSCULAR | Status: DC | PRN

## 2022-02-12 MED ORDER — LIDOCAINE-EPINEPHRINE 2 %-1:200000 IJ SOLN
10.0000 mL | Freq: Once | INTRAMUSCULAR | Status: AC
Start: 2022-02-12 — End: 2022-02-12
  Administered 2022-02-12: 10 mL via INTRADERMAL
  Filled 2022-02-12: qty 20

## 2022-02-12 MED ORDER — MELATONIN 5 MG PO TABS
20.0000 mg | ORAL_TABLET | Freq: Every day | ORAL | Status: DC
  Administered 2022-02-12 – 2022-02-15 (×4): 20 mg via ORAL
  Filled 2022-02-12 (×4): qty 4

## 2022-02-12 NOTE — ED Notes (Signed)
Trauma Response Nurse Documentation ? ? ?Troy Garcialopez [name] is a 81 y.o. male arriving to [facility] ED via EMS ? ?On Eliquis (apixaban) daily. Trauma was activated as a Level 2 by ED Charge RN based on the following trauma criteria Elderly patients > 65 with head trauma on anti-coagulation (excluding ASA). Trauma RN at the bedside on patient arrival. Patient cleared for CT by Troy. Regenia Skeeter. Patient to CT with team. GCS 15. ? ?History  ? Past Medical History:  ?Diagnosis Date  ? AAA (abdominal aortic aneurysm) (T[name]) LAST ABDOMINAL [name] 10-20-17 3.3 CM  ? MONITORED BY Troy [name]  ? Allergy   ? Anxiety   ? Arthritis   ? Atrial fibrillation (Troy [name])   ? Basal cell carcinoma   ? Bilateral carotid artery stenosis DUPLEX 12-29-2012  BY [name]  ? BILATERAL ICA STENOSIS 60-79%  ? BPH (benign prostatic hypertrophy)   ? Cataract   ? removed both eyes  ? CHF (congestive heart failure) (Troy [name])   ? Chronic diastolic heart failure (Troy [name])   ? Chronic kidney disease   ? stage 3, pt unaware  ? Colon cancer (Troy [name])   ? Complication of anesthesia   ? had nausea after carotid artery surgery, states the medicine given to help the nausea, made it worse  ? Constipation   ? COPD (chronic obstructive pulmonary disease) (Troy [name])   ? Depression   ? Emphysema of lung (Troy [name])   ? GERD (gastroesophageal reflux disease)   ? Glaucoma BOTH EYES  ? [name]  ? History of basal cell carcinoma excision   ? History of lung cancer APRIL 2012  SQUAMOUS CELL---- S/P RIGHT LOWER LOBECTOMY AT [facility] --  NO CHEMORADIATION---  NO RECURRENCE   ? ONCOLOGIST- Troy Patsi Sears IN [facility] 10-27-2012  ? History of pneumothorax   ? pt unaware  ? Hx of adenomatous colonic polyps 2005  ?  X 2; 1 hyperplastic polyp; Troy Olevia Perches  ? Hyperlipidemia   ? Hypertension   ? Impaired fasting glucose 2007  ? 108; A1c5.4%  ? Lesion of bladder   ? Lung cancer (Troy [name]) 01/25/2012  ? Microhematuria   ? OSA (obstructive sleep apnea) 08/29/2015  ? CPAP SET ON 10  ? PAD (peripheral artery disease)  (Troy [name]) ABI'S  JAN 2014  0.65 ON RIGHT ;  1.04 ON LEFT  ? Peripheral vascular disease (Troy [name]) S/P ANGIOPLASTY AND STENTING  ? FOLLOWED  BY Troy [name]  ? Pleural effusion on right 11/18/2018  ? Chronic, noted on CXR  ? Sleep apnea   ? + cpap   ? Spinal stenosis Sept. 2015  ? Status post placement of implantable loop recorder   ? Stroke [facility]) Jul 07, 2013  ? mini  TIA  ? Thoracic aorta atherosclerosis (Troy [name])   ? 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 Urgency of urination   ?  ? Past Surgical History:  ?Procedure Laterality Date  ? ANGIO PLASTY    ? X 4 in legs  ? AORTOGRAM  07-27-2002  ? MILD DIFFUSE ILIAC ARTERY OCCLUSIVE DISEASE /  LEFT RENAL ARTERY 20%/ PATENT LEFT FEM-POP GRAFT/ MILD SFA AND POPLITEAL ARTERY OCCLUSIVE DISEASE W/ SEVERE KIDNEY OCCLUSIVE DISEASE  ? ATRIAL FIBRILLATION ABLATION N/A 04/09/2019  ? Procedure: ATRIAL FIBRILLATION ABLATION;  Surgeon: Thompson Grayer, MD;  Location: [name];  Service: Cardiovascular;  Laterality: N/A;  ? ATRIAL FIBRILLATION ABLATION N/A 02/12/2020  ? Procedure: ATRIAL FIBRILLATION ABLATION;  Surgeon: Thompson Grayer, MD;  Location:  [name];  Service: Cardiovascular;  Laterality: N/A;  ? BALLOON DILATION N/A 02/06/2022  ? Procedure: BALLOON Tamponade ampulla;  Surgeon: Irving Copas., MD;  Location: [name];  Service: Gastroenterology;  Laterality: N/A;  ? BASAL CELL CARCINOMA EXCISION    ? MULTIPLE TIMES--  RIGHT FOREARM, CHEEKS, AND BACK  ? BIOPSY  06/25/2018  ? Procedure: BIOPSY;  Surgeon: Irving Copas., MD;  Location: [name];  Service: Gastroenterology;;  ? BIOPSY  02/06/2022  ? Procedure: BIOPSY;  Surgeon: Irving Copas., MD;  Location: [name];  Service: Gastroenterology;;  ? CARDIOVASCULAR STRESS TEST  12-08-2012  Troy MCLEAN  ? NORMAL LEXISCAN WITH NO EXERCISE NUCLEAR STUDY/ EF 66%/   NO ISCHEMIA/ NO SIGNIFICANT CHANGE FROM PRIOR STUDY  ? CARDIOVERSION N/A 02/14/2018  ? Procedure: CARDIOVERSION;  Surgeon: Larey Dresser, MD;  Location: [name];  Service: Cardiovascular;  Laterality: N/A;  ? CAROTID ANGIOGRAM N/A 07/10/2013  ? Procedure: CAROTID ANGIOGRAM;  Surgeon: Elam Dutch, MD;  Location: [name];  Service: Cardiovascular;  Laterality: N/A;  ? CAROTID ENDARTERECTOMY Right 07-14-13  ? cea  ? CATARACT EXTRACTION W/ INTRAOCULAR LENS  IMPLANT, BILATERAL    ? colon polyectomy    ? COLONOSCOPY    ? COLONOSCOPY WITH PROPOFOL N/A 06/25/2018  ? Procedure: COLONOSCOPY WITH PROPOFOL;  Surgeon: Mansouraty, Telford Nab., MD;  Location: [name];  Service: Gastroenterology;  Laterality: N/A;  ? CYSTOSCOPY W/ RETROGRADES Bilateral 01/21/2013  ? Procedure: CYSTOSCOPY WITH RETROGRADE PYELOGRAM;  Surgeon: Molli Hazard, MD;  Location: [facility];  Service: Urology;  Laterality: Bilateral;   ?CYSTO, BLADDER BIOPSY, BILATERAL RETROGRADE PYELOGRAM ? RAD TECH FROM RADIOLOGY PER JOY  ? CYSTOSCOPY WITH BIOPSY N/A 01/21/2013  ? Procedure: CYSTOSCOPY WITH BIOPSY;  Surgeon: Molli Hazard, MD;  Location: [name];  Service: Urology;  Laterality: N/A;  ? ENDARTERECTOMY Right 07/14/2013  ? Procedure: ENDARTERECTOMY CAROTID;  Surgeon: Angelia Mould, MD;  Location: [name];  Service: Vascular;  Laterality: Right;  ? ENDOSCOPIC RETROGRADE CHOLANGIOPANCREATOGRAPHY (ERCP) WITH PROPOFOL N/A 02/06/2022  ? Procedure: ENDOSCOPIC RETROGRADE CHOLANGIOPANCREATOGRAPHY (ERCP) WITH PROPOFOL;  Surgeon: Rush Landmark Telford Nab., MD;  Location: [name];  Service: Gastroenterology;  Laterality: N/A;  ? EP IMPLANTABLE DEVICE N/A 08/12/2015  ? Procedure: Loop Recorder Insertion;  Surgeon: Thompson Grayer, MD;  Location: [place] [name];  Service: Cardiovascular;  Laterality: N/A;  ? ESOPHAGOGASTRODUODENOSCOPY (EGD) WITH PROPOFOL N/A 06/25/2018  ? Procedure: ESOPHAGOGASTRODUODENOSCOPY (EGD) WITH PROPOFOL;  Surgeon: Rush Landmark Telford Nab., MD;  Location: [place];  Service: Gastroenterology;   Laterality: N/A;  ? ESOPHAGOGASTRODUODENOSCOPY (EGD) WITH PROPOFOL N/A 07/10/2018  ? Procedure: ESOPHAGOGASTRODUODENOSCOPY (EGD) WITH PROPOFOL;  Surgeon: Milus Banister, MD;  [facility]: [name];  Service: [name];  Laterality: N/A;  ? EUS N/A 07/10/2018  ? Procedure: UPPER ENDOSCOPIC ULTRASOUND (EUS) RADIAL;  Surgeon: Milus Banister, MD;  [facility]: [name];  Service: [name];  Laterality: N/A;  ? EYE SURGERY Right   ? FEMORAL-POPLITEAL BYPASS GRAFT Left [facility]  ? AND 2001  IN [place]  ? FEMORAL-POPLITEAL BYPASS GRAFT Left 08/30/2015  ? Procedure: REVISION OF BYPASS GRAFT Left  FEMORAL-POPLITEAL ARTERY;  Surgeon: Angelia Mould, MD;  Location: [place];  Service: Vascular;  Laterality: Left;  ? FINE NEEDLE ASPIRATION N/A 07/10/2018  ? Procedure: FINE NEEDLE ASPIRATION (FNA) LINEAR;  Surgeon: Milus Banister, MD;  Location: [name];  Service: [name];  Laterality: N/A;  ? FLEXIBLE SIGMOIDOSCOPY N/A  12/12/2018  ? Procedure: FLEXIBLE SIGMOIDOSCOPY;  Surgeon: Ileana Roup, MD;  Location: [facility];  Service: General;  Laterality: N/A;  ? LARYNGOSCOPY  06-27-2004  ? BX VOCAL CORD  (LEUKOPLAKIA)  PER PT NO ISSUES SINCE  ? LEFT HEART CATH AND CORONARY ANGIOGRAPHY N/A 08/20/2017  ? Procedure: LEFT HEART CATH AND CORONARY ANGIOGRAPHY;  Surgeon: Larey Dresser, MD;  Location: [place] [name];  Service: Cardiovascular;  Laterality: N/A;  ? LOOP RECORDER INSERTION N/A 04/09/2019  ? Procedure: LOOP RECORDER INSERTION;  Surgeon: Thompson Grayer, MD;  Location: [place] [name];  Service: Cardiovascular;  Laterality: N/A;  ? LOOP RECORDER REMOVAL N/A 04/09/2019  ? Procedure: LOOP RECORDER REMOVAL;  Surgeon: Thompson Grayer, MD;  Location: [place] [name];  Service: Cardiovascular;  Laterality: N/A;  ? LOWER EXTREMITY ANGIOGRAM Bilateral 08/29/2015  ? Procedure: Lower Extremity Angiogram;  Surgeon: Angelia Mould, MD;  Location: [place] [name];  Service: Cardiovascular;   Laterality: Bilateral;  ? LUNG LOBECTOMY  01/24/2011  ?  RIGHT UPPER LOBE  (SQUAMOUS CELL CARCINOMA) [facility] [place] , [facility]. No chemotherapyor radiation  ? PANCREATIC STENT PLACEMENT  02/06/2022  ? Procedure: PANCREATIC STENT

## 2022-02-12 NOTE — ED Provider Notes (Signed)
?[facility] ?Provider Note ? ? ?CSN: [id number] ?Arrival date & time: 02/12/22  1307 ? ?  ? ?History ? ?Chief Complaint  ?Patient presents with  ? Trauma  ? ? ?Troy Wavra [name] is a 81 y.o. male. ? ?HPI ?81 year old male presents as a level 2 trauma for a fall on blood thinners.  Patient reports that he was going to the bathroom and was feeling lightheaded and fell and struck the toilet on his forehead.  He states he has been feeling weak and fatigued and having a poor appetite for about 3 days.  He denies any acute pain.  He has chronic dyspnea but no new dyspnea.  No chest pain today.  EMS reports his first blood pressure was in the 98 systolic range and she was going to give him fluids but his blood pressure came up to 789 systolic and so she just transported instead.  Patient reports he last took his Eliquis this morning. ? ?Home Medications ?Prior to Admission medications   ?Medication Sig Start Date End Date Taking? Authorizing Provider  ?acetaminophen (TYLENOL) 325 MG tablet Take 650 mg by mouth every 6 (six) hours as needed for moderate pain or headache.    [provider]  ?albuterol (VENTOLIN HFA) 108 (90 Base) MCG/ACT inhaler Inhale 2 puffs into the lungs every 6 (six) hours as needed for wheezing or shortness of breath. 07/06/21   Ghimire, Henreitta Leber, MD  ?amLODipine (NORVASC) 5 MG tablet TAKE ONE TABLET BY MOUTH EVERY DAY ?Patient taking differently: Take 5 mg by mouth daily. 12/15/21   Larey Dresser, MD  ?apixaban (ELIQUIS) 5 MG TABS tablet Take 1 tablet (5 mg total) by mouth 2 (two) times daily. 02/08/22   Mansouraty, Telford Nab., MD  ?Brinzolamide-Brimonidine [facility]) 1-0.2 % SUSP Apply 1 drop to eye in the morning, at noon, and at bedtime.    [provider]  ?Budeson-Glycopyrrol-Formoterol (BREZTRI AEROSPHERE) 160-9-4.8 MCG/ACT AERO Inhale 2 puffs into the lungs in the morning and at bedtime. 07/27/21   Garner Nash, DO  ?docusate  sodium (COLACE) 100 MG capsule Take 100 mg by mouth 2 (two) times daily.     [provider]  ?escitalopram (LEXAPRO) 10 MG tablet TAKE ONE TABLET BY MOUTH EVERY DAY ?Patient taking differently: Take 10 mg by mouth daily. 09/22/21   Binnie Rail, MD  ?furosemide (LASIX) 20 MG tablet Take 3 tablets (60 mg total) by mouth 2 (two) times daily. 11/16/21   Rafael Bihari, FNP  ?JARDIANCE 10 MG TABS tablet TAKE ONE TABLET BY MOUTH EVERY DAY BEFORE BREAKFAST. ?Patient taking differently: 10 mg daily. 01/08/22   Larey Dresser, MD  ?latanoprost (XALATAN) 0.005 % ophthalmic solution Place 1 drop into both eyes at bedtime. 05/16/20   [provider]  ?losartan (COZAAR) 25 MG tablet TAKE ONE TABLET BY MOUTH EVERY DAY ?Patient taking differently: Take 25 mg by mouth daily. 11/17/21   Larey Dresser, MD  ?Melatonin 10 MG TABS Take 20 mg by mouth at bedtime.    [provider]  ?metoprolol succinate (TOPROL-XL) 25 MG 24 hr tablet TAKE 1/2 TABLET EVERY EVENING WITH OR IMMEDIATELY FOLLOWING A MEAL 02/09/22   Larey Dresser, MD  ?montelukast (SINGULAIR) 10 MG tablet TAKE ONE TABLET AT BEDTIME ?Patient taking differently: Take 10 mg by mouth at bedtime. 09/22/21   Binnie Rail, MD  ?Multiple Vitamins-Minerals (CENTRUM SILVER PO) Take 1 tablet by mouth daily.  [provider]  ?omeprazole (PRILOSEC) 20 MG capsule TAKE ONE CAPSULE TWICE DAILY 12/19/21   Mansouraty, Telford Nab., MD  ?OVER THE COUNTER MEDICATION Take 3 capsules by mouth daily. Balance of [place], Historical, MD  ?OVER THE COUNTER MEDICATION Take 3 capsules by mouth daily. Balance of [employer]    [provider]  ?potassium chloride SA (KLOR-CON M) 20 MEQ tablet Take 2 tablets (40 mEq total) by mouth every morning AND 1 tablet (20 mEq total) every evening. 10/24/21   Larey Dresser, MD  ?rosuvastatin (CRESTOR) 40 MG tablet TAKE ONE TABLET BY MOUTH EVERY DAY ?Patient taking differently: Take  40 mg by mouth daily. 02/09/22   Larey Dresser, MD  ?senna (SENOKOT) 8.6 MG tablet Take 1 tablet by mouth 2 (two) times daily.    [provider]  ?VASCEPA 1 g capsule Take 2 capsules (2 g total) by mouth 2 (two) times daily. 08/22/21   Larey Dresser, MD  ?   ? ?Allergies    ?Niacin-lovastatin er, Penicillins, Sulfonamide derivatives, and Atorvastatin   ? ?Review of Systems   ?Review of Systems  ?Constitutional:  Positive for fatigue.  ?Respiratory:  Negative for shortness of breath.   ?Cardiovascular:  Negative for chest pain.  ?Neurological:  Positive for weakness and light-headedness.  ? ?Physical Exam ?Updated Vital Signs ?BP (!) 124/52   Pulse 67   Temp 98.2 ?F (36.8 ?C) (Oral)   Resp (!) 22   Ht 6' (1.829 m)   Wt 87.5 kg   SpO2 100%   BMI 26.18 kg/m?  ?Physical Exam ?Vitals and nursing note reviewed.  ?Constitutional:   ?   Appearance: He is well-developed.  ?HENT:  ?   Head: Normocephalic. Laceration present.  ? ?Cardiovascular:  ?   Rate and Rhythm: Normal rate and regular rhythm.  ?   Heart sounds: Normal heart sounds.  ?Pulmonary:  ?   Effort: Pulmonary effort is normal.  ?   Breath sounds: Normal breath sounds.  ?Abdominal:  ?   Palpations: Abdomen is soft.  ?   Tenderness: There is no abdominal tenderness.  ?Skin: ?   General: Skin is warm and dry.  ?Neurological:  ?   Mental Status: He is alert and oriented to person, place, and time.  ?   Comments: No slurred speech or facial droop.  Equal strength in all 4 extremities.  ? ? ?ED Results / Procedures / Treatments   ?Labs ?(all labs ordered are listed, but only abnormal results are displayed) ?Labs Reviewed  ?COMPREHENSIVE METABOLIC PANEL - Abnormal; Notable for the following components:  ?    Result Value  ? CO2 20 (*)   ? Glucose, Bld 216 (*)   ? BUN 46 (*)   ? Creatinine, Ser 1.83 (*)   ? Calcium 8.6 (*)   ? Total Protein 5.5 (*)   ? Albumin 3.1 (*)   ? GFR, Estimated 37 (*)   ? All other components within normal limits  ?CBC WITH  DIFFERENTIAL/PLATELET - Abnormal; Notable for the following components:  ? RBC 2.73 (*)   ? Hemoglobin 8.6 (*)   ? HCT 26.3 (*)   ? RDW 16.9 (*)   ? Lymphs Abs 0.6 (*)   ? All other components within normal limits  ?POC OCCULT BLOOD, ED - Abnormal; Notable for the following components:  ? Fecal Occult Bld POSITIVE (*)   ? All other components within normal limits  ?URINALYSIS,  ROUTINE W REFLEX MICROSCOPIC  ?VITAMIN B12  ?FOLATE  ?IRON AND TIBC  ?FERRITIN  ?RETICULOCYTES  ?TYPE AND SCREEN  ? ? ?EKG ?EKG Interpretation ? ?Date/Time:  Monday Feb 12 2022 13:19:54 EDT ?Ventricular Rate:  79 ?PR Interval:    ?QRS Duration: 124 ?QT Interval:  454 ?QTC Calculation: 462 ?R Axis:   96 ?Text Interpretation: Atrial fibrillation Ventricular premature complex Right bundle branch block Confirmed by Sherwood Gambler [phone]) on 02/12/2022 1:44:22 PM ? ?Radiology ?CT Head Wo Contrast ? ?Result Date: 02/12/2022 ?CLINICAL DATA:  Fall, head and neck trauma EXAM: CT HEAD WITHOUT CONTRAST CT CERVICAL SPINE WITHOUT CONTRAST TECHNIQUE: Multidetector CT imaging of the head and cervical spine was performed following the standard protocol without intravenous contrast. Multiplanar CT image reconstructions of the cervical spine were also generated. RADIATION DOSE REDUCTION: This exam was performed according to the departmental dose-optimization program which includes automated exposure control, adjustment of the mA and/or kV according to patient size and/or use of iterative reconstruction technique. COMPARISON:  None Available. CT head dated June 23, 2018 FINDINGS: CT HEAD FINDINGS Brain: No evidence of acute infarction, hemorrhage, hydrocephalus, extra-axial collection or mass lesion/mass effect. Mild cerebral atrophy and chronic microvascular ischemic changes of the white matter. Vascular: No hyperdense vessel or unexpected calcification. Skull: Normal. Negative for fracture or focal lesion. Sinuses/Orbits: No acute finding.  Bilateral  cataract surgery. Other: None. CT CERVICAL SPINE FINDINGS Alignment: Straightening of the cervical spine. Skull base and vertebrae: No acute fracture. No primary bone lesion or focal pathologic process. Soft tis

## 2022-02-12 NOTE — ED Notes (Addendum)
Fell from standing position after getting weak and dizzy hitting head on the toilet.  Also complains of sob all the time but nothing that is worse. Patient on eliquis. Lives at home with caregivers who come in. Lac to forehead. No LOC ?

## 2022-02-12 NOTE — Progress Notes (Signed)
?  02/12/22 1300  ?Clinical Encounter Type  ?Visited With Patient and family together;Health care provider ?(Patient's Daughter)  ?Visit Type Trauma;ED;Initial ?(Level 2 Trauma)  ?Referral From Nurse  ?Consult/Referral To Chaplain ?Albertina Parr [place])  ? ?Responded to page in E.D.  Room 25 for Level 2 Trauma. Met Mr. Rockney Grenz. Lorenza Chick, [name] and his daughter at patient's bedside. Chaplain services not required at this time.  ?Staff will page Chaplain upon request of patient or family.  ?Chaplain Camay Pedigo, M.Min., [phone].   ?

## 2022-02-12 NOTE — H&P (Signed)
?History and Physical  ? ? ?Patient: Troy Boody [name] WUJ:[id number] DOB: 04-22-1941 ?DOA: 02/12/2022 ?DOS: the patient was seen and examined on 02/12/2022 ?PCP: Binnie Rail, MD  ?Patient coming from: Home - lives with 2 caregivers, 9am-8pm; NOK: Daughter, Troy [name] "Troy [name], [phone] ? ? ?Chief Complaint: Fall ? ?HPI: Troy Ringenberg [name] is a 81 y.o. male with medical history significant of AAA; afib on Eliquis; BPH; chronic diastolic CHF; stage 3 CKD; glaucoma with severe visual impairment; colon/lung CA; COPD; HTN; HLD; and PVD s/p stents presenting with a fall.  He had an ERCP procedure on 5/9 and sedation tends to make him constipated.  When he started having stools they were soft and dark.  He "came home feeling like hell" and has been in bed since then - first getting over the anesthesia and then with generalized weakness.  He has been weak and dizzy with no appetite.  Last diarrhea was a couple of days ago; his caregivers don't help him in the bathroom and he is visually impaired and so would not know if there has been blood.  No abdominal pain.  No n/v.  Today, he got up to go to the bathroom and got lightheaded, woozy, and he fell in the bathroom.  He did not lose consciousness.  He hit his forehead and had a few other minor scrapes.     ? ? ? ?ER Course:  ERCP a few days ago, almost passed out in the bathroom today.  Head lac, repaired.  Hgb 13 -> 8, +melena.  Given Protonix.  GI to see. ? ? ? ? ?Review of Systems: As mentioned in the history of present illness. All other systems reviewed and are negative. ?Past Medical History:  ?Diagnosis Date  ? AAA (abdominal aortic aneurysm) ([place]) LAST ABDOMINAL US 10-20-17 3.3 CM  ? MONITORED BY DR [name]  ? Anxiety   ? Arthritis   ? Atrial fibrillation ([place])   ? Basal cell carcinoma   ? BPH (benign prostatic hypertrophy)   ? Chronic diastolic heart failure ([place])   ? Chronic kidney disease   ? stage 3, pt unaware  ? Colon cancer ([place])   ? COPD  (chronic obstructive pulmonary disease) ([place])   ? Depression   ? GERD (gastroesophageal reflux disease)   ? Glaucoma BOTH EYES  ? Dr Gershon Crane  ? History of lung cancer APRIL 2012  SQUAMOUS CELL---- S/P RIGHT LOWER LOBECTOMY AT [facility] --  NO CHEMORADIATION---  NO RECURRENCE   ? ONCOLOGIST- DR Patsi Sears IN [facility] 10-27-2012  ? History of pneumothorax   ? pt unaware  ? Hx of adenomatous colonic polyps 2005  ?  X 2; 1 hyperplastic polyp; Dr Olevia Perches  ? Hyperlipidemia   ? Hypertension   ? Impaired fasting glucose 2007  ? 108; A1c5.4%  ? OSA (obstructive sleep apnea) 08/29/2015  ? CPAP SET ON 10  ? Peripheral vascular disease ([place]) S/P ANGIOPLASTY AND STENTING  ? FOLLOWED  BY DR [name]  ? Spinal stenosis 06/2014  ? Status post placement of implantable loop recorder   ? Thoracic aorta atherosclerosis ([place])   ? 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 ?Past Surgical History:  ?Procedure Laterality Date  ? ANGIO PLASTY    ? X 4 in legs  ? AORTOGRAM  07-27-2002  ? MILD DIFFUSE ILIAC ARTERY OCCLUSIVE DISEASE /  LEFT RENAL ARTERY 20%/ PATENT LEFT FEM-POP GRAFT/ MILD SFA AND POPLITEAL ARTERY OCCLUSIVE DISEASE W/ SEVERE KIDNEY OCCLUSIVE  DISEASE  ? ATRIAL FIBRILLATION ABLATION N/A 04/09/2019  ? Procedure: ATRIAL FIBRILLATION ABLATION;  Surgeon: Thompson Grayer, MD;  Location: [place] [facility];  Service: Cardiovascular;  Laterality: N/A;  ? ATRIAL FIBRILLATION ABLATION N/A 02/12/2020  ? Procedure: ATRIAL FIBRILLATION ABLATION;  Surgeon: Thompson Grayer, MD;  Location: [place] [facility];  Service: Cardiovascular;  Laterality: N/A;  ? BALLOON DILATION N/A 02/06/2022  ? Procedure: BALLOON Tamponade ampulla;  Surgeon: Irving Copas., MD;  Location: [name];  Service: Gastroenterology;  Laterality: N/A;  ? BASAL CELL CARCINOMA EXCISION    ? MULTIPLE TIMES--  RIGHT FOREARM, CHEEKS, AND BACK  ? BIOPSY  06/25/2018  ? Procedure: BIOPSY;  Surgeon: Irving Copas., MD;  Location: [place];  Service: Gastroenterology;;  ?  BIOPSY  02/06/2022  ? Procedure: BIOPSY;  Surgeon: Irving Copas., MD;  Location: [name];  Service: Gastroenterology;;  ? CARDIOVASCULAR STRESS TEST  12-08-2012  DR MCLEAN  ? NORMAL LEXISCAN WITH NO EXERCISE NUCLEAR STUDY/ EF 66%/   NO ISCHEMIA/ NO SIGNIFICANT CHANGE FROM PRIOR STUDY  ? CARDIOVERSION N/A 02/14/2018  ? Procedure: CARDIOVERSION;  Surgeon: Larey Dresser, MD;  Location: [facility] ENDOSCOPY;  Service: Cardiovascular;  Laterality: N/A;  ? CAROTID ANGIOGRAM N/A 07/10/2013  ? Procedure: CAROTID ANGIOGRAM;  Surgeon: Elam Dutch, MD;  Location: [facility] CATH LAB;  Service: Cardiovascular;  Laterality: N/A;  ? CAROTID ENDARTERECTOMY Right 07-14-13  ? cea  ? CATARACT EXTRACTION W/ INTRAOCULAR LENS  IMPLANT, BILATERAL    ? colon polyectomy    ? COLONOSCOPY    ? COLONOSCOPY WITH PROPOFOL N/A 06/25/2018  ? Procedure: COLONOSCOPY WITH PROPOFOL;  Surgeon: Mansouraty, Telford Nab., MD;  Location: [place];  Service: Gastroenterology;  Laterality: N/A;  ? CYSTOSCOPY W/ RETROGRADES Bilateral 01/21/2013  ? Procedure: CYSTOSCOPY WITH RETROGRADE PYELOGRAM;  Surgeon: Molli Hazard, MD;  Location: [facility];  Service: Urology;  Laterality: Bilateral;   ?CYSTO, BLADDER BIOPSY, BILATERAL RETROGRADE PYELOGRAM ? RAD TECH FROM RADIOLOGY PER JOY  ? CYSTOSCOPY WITH BIOPSY N/A 01/21/2013  ? Procedure: CYSTOSCOPY WITH BIOPSY;  Surgeon: Molli Hazard, MD;  Location: [facility];  Service: Urology;  Laterality: N/A;  ? ENDARTERECTOMY Right 07/14/2013  ? Procedure: ENDARTERECTOMY CAROTID;  Surgeon: Angelia Mould, MD;  Location: [facility] OR;  Service: Vascular;  Laterality: Right;  ? ENDOSCOPIC RETROGRADE CHOLANGIOPANCREATOGRAPHY (ERCP) WITH PROPOFOL N/A 02/06/2022  ? Procedure: ENDOSCOPIC RETROGRADE CHOLANGIOPANCREATOGRAPHY (ERCP) WITH PROPOFOL;  Surgeon: Rush Landmark Telford Nab., MD;  Location: [facility];  Service: Gastroenterology;  Laterality: N/A;  ? EP IMPLANTABLE DEVICE N/A  08/12/2015  ? Procedure: Loop Recorder Insertion;  Surgeon: Thompson Grayer, MD;  Location: [place] [facility];  Service: Cardiovascular;  Laterality: N/A;  ? ESOPHAGOGASTRODUODENOSCOPY (EGD) WITH PROPOFOL N/A 06/25/2018  ? Procedure: ESOPHAGOGASTRODUODENOSCOPY (EGD) WITH PROPOFOL;  Surgeon: Rush Landmark Telford Nab., MD;  Location: [place];  Service: Gastroenterology;  Laterality: N/A;  ? ESOPHAGOGASTRODUODENOSCOPY (EGD) WITH PROPOFOL N/A 07/10/2018  ? Procedure: ESOPHAGOGASTRODUODENOSCOPY (EGD) WITH PROPOFOL;  Surgeon: Milus Banister, MD;  Location: [facility];  Service: Endoscopy;  Laterality: N/A;  ? EUS N/A 07/10/2018  ? Procedure: UPPER ENDOSCOPIC ULTRASOUND (EUS) RADIAL;  Surgeon: Milus Banister, MD;  Location: [facility];  Service: Endoscopy;  Laterality: N/A;  ? EYE SURGERY Right   ? FEMORAL-POPLITEAL BYPASS GRAFT Left [facility]  ? AND 2001  IN [place]  ? FEMORAL-POPLITEAL BYPASS GRAFT Left 08/30/2015  ? Procedure: REVISION OF BYPASS GRAFT Left  FEMORAL-POPLITEAL ARTERY;  Surgeon: Angelia Mould, MD;  Location: [place];  Service: Vascular;  Laterality: Left;  ? FINE NEEDLE ASPIRATION N/A 07/10/2018  ? Procedure: FINE NEEDLE ASPIRATION (FNA) LINEAR;  Surgeon: Milus Banister, MD;  Location: [facility];  Service: Endoscopy;  Laterality: N/A;  ? FLEXIBLE SIGMOIDOSCOPY N/A 12/12/2018  ? Procedure: FLEXIBLE SIGMOIDOSCOPY;  Surgeon: Ileana Roup, MD;  Location: [facility];  Service: General;  Laterality: N/A;  ? LARYNGOSCOPY  06-27-2004  ? BX VOCAL CORD  (LEUKOPLAKIA)  PER PT NO ISSUES SINCE  ? LEFT HEART CATH AND CORONARY ANGIOGRAPHY N/A 08/20/2017  ? Procedure: LEFT HEART CATH AND CORONARY ANGIOGRAPHY;  Surgeon: Larey Dresser, MD;  Location: [place] [facility];  Service: Cardiovascular;  Laterality: N/A;  ? LOOP RECORDER INSERTION N/A 04/09/2019  ? Procedure: LOOP RECORDER INSERTION;  Surgeon: Thompson Grayer, MD;  Location: [place] [facility];  Service: Cardiovascular;  Laterality:  N/A;  ? LOOP RECORDER REMOVAL N/A 04/09/2019  ? Procedure: LOOP RECORDER REMOVAL;  Surgeon: Thompson Grayer, MD;  Location: [place] [facility];  Service: Cardiovascular;  Laterality: N/A;  ? LOWER EXTREMITY A

## 2022-02-12 NOTE — Progress Notes (Signed)
Pt arrived to [facility]. Alert and oriented x 4, identified appropriately. VS stable, no signs of acute distress, pt denied SOB and chest pain.  ?Pt placed on cardiac monitor and CCMD notified.  ?Oriented to room and equipment and instructed to call for assistance. ?Pt instructed how to use call bell and call bell left within pt reach. ?Will continue to monitor pt and treat per MD orders. ?

## 2022-02-12 NOTE — H&P (View-Only) (Signed)
Attending physician's note  ? ?I have taken a history, reviewed the chart, and examined the patient. I performed a substantive portion of this encounter, including complete performance of at least one of the key components, in conjunction with the APP. I agree with the APP's note, impression, and recommendations with my edits.  ? ?81 year old male with medical history as below to include history of A-fib (on Eliquis), AAA, BPH, CHF, CKD 3, colon/lung cancer, COPD, HTN, HLD, PVD s/p stenting, presenting to the ER with melena, FOBT positive stool, and acute blood loss anemia.  Patient is legally blind, so he describes melena more as loose stools with increased frequency.  He apparently had a syncopal event in the bathroom today. ? ?- ERCP (02/06/2022): J-shaped stomach with multiple gastric polyps and mild friability (path benign).  Required left lateral positioning to enter duodenal bulb.  Major papilla located on rim of diverticulum.  Difficulty with wire-guided cannulation requiring double wire approach to enter small ampulla.  Still unable to cannulate bile duct, so 4 [place] by 7 cm temporary plastic pancreatic stent was placed in PD.  This then allowed for deep cannulation of the CBD.  Lower third of main bile duct contained filling defects/stones, moderately dilated CBD up to 15 mm.  10 mm sphincterotomy and balloon sweeps with removal of 2 stones.  None remained.  Sphincterotomy bleeding from the apex of the cut after removal of larger stone which was treated with balloon tamponade with 10 mm balloon x4 minutes with cessation of bleeding.  Restarted Eliquis on 5/11 ? ?ER evaluation notable for the following: ?- H/H 8.6/26.3 (from 13.4/40.2 last month), MCV/RDW 96/17 ?- BUN/creatinine 46/1.83 (baseline BUN mid 20s) ?- Albumin 3.1, otherwise normal liver enzymes ?- FOBT positive ?- CT head: No acute intracranial pathology ? ? ? ?1) Acute blood loss anemia ?2) Presumed melena ?3) Change in bowel habits ?4)  Symptomatic anemia ?5) Elevated BUN beyond baseline ?6) Heme positive stool ? ?-Discussed potential DDx for UGIB to include possibly bleeding from sphincterotomy site ?- Hold Eliquis and allow for washout with procedures tomorrow ?- N.p.o. at midnight ?- Plan for upper endoscopy tomorrow.  Will start with EGD scope and exchanged for side-viewing duodenoscope if needed.  Did discuss the possibility of ERCP/biliary cannulation depending on endoscopic findings ?- Continue trending serial H/H with blood products as needed per protocol ? ? ?The indications, risks, and benefits of EGD and ERCP were explained to the patient and daughter at bedside in detail. Risks include but are not limited to bleeding, perforation, adverse reaction to medications, and cardiopulmonary compromise. Sequelae include but are not limited to the possibility of surgery, prolonged hospitalization, pancreatitis, inability to cannulate bile duct, and mortality. The patient verbalized understanding and wished to proceed. All questions answered, referred to scheduler. Further recommendations pending results of the exam.  ? ? ? ?[address], DO, FACG ?[phone] office  ? ?   ? ? ? ? ? ? ? ? ? Consultation ? ?Referring Provider: Dr. Regenia Skeeter     ?Primary Care Physician:  Binnie Rail, MD ?Primary Gastroenterologist:   Dr. Rush Landmark      ?Reason for Consultation:  Melena and Symptomatic Anemia    ?       ? HPI:   ?Anthonie Lotito [name] is a 81 y.o. male with a past medical history as listed below including A-fib on Eliquis, AAA, BPH, chronic diastolic CHF, stage III CKD, colon/lung cancer, COPD, hypertension, hyperlipidemia and PVD status post stents  who presented to the ER today after a fall.  We are consulted in regards to black stool which is Hemoccult positive and anemia. ?   Patient did undergo ERCP 02/06/2022 as below with Dr. Rush Landmark which was technically complicated. ?   Today, patient is seen with his daughter by his bedside who  does assist with history.  He is legally blind and tells me that really ever since time of the procedure he has not felt well at all just fatigued and dizzy anytime he would try and move, also tells me that he has had looser than normal stools until just the other day every time that he would get up to urinate which is "quite a lot because I am on Lasix".  Today when getting up to go to the bathroom he passed out, fell and hit his head which is what prompted his ER visit.  He did restart his Eliquis as told on 02/08/2022.  His last dose was this morning. ?   Denies fever or chills. ? ?GI History: ?02/06/22 ERCP Dr Rush Landmark for bile duct stones with J shaped deformity of stomach, a few gastric polyps biopsied, techinically difficult with eventual sphincterotomy and complete removal of stones, some activing bleeding after balloon trawl-treated with balloon tamponade-instructed to restart Eliquis 5/11 ? ? ?Past Medical History:  ?Diagnosis Date  ? AAA (abdominal aortic aneurysm) ([place]) LAST ABDOMINAL US 10-20-17 3.3 CM  ? MONITORED BY DR [name]  ? Anxiety   ? Arthritis   ? Atrial fibrillation ([place])   ? Basal cell carcinoma   ? BPH (benign prostatic hypertrophy)   ? Chronic diastolic heart failure ([place])   ? Chronic kidney disease   ? stage 3, pt unaware  ? Colon cancer ([place])   ? COPD (chronic obstructive pulmonary disease) ([place])   ? Depression   ? GERD (gastroesophageal reflux disease)   ? Glaucoma BOTH EYES  ? Dr Gershon Crane  ? History of lung cancer APRIL 2012  SQUAMOUS CELL---- S/P RIGHT LOWER LOBECTOMY AT [facility] --  NO CHEMORADIATION---  NO RECURRENCE   ? ONCOLOGIST- DR Patsi Sears IN [facility] 10-27-2012  ? History of pneumothorax   ? pt unaware  ? Hx of adenomatous colonic polyps 2005  ?  X 2; 1 hyperplastic polyp; Dr Olevia Perches  ? Hyperlipidemia   ? Hypertension   ? Impaired fasting glucose 2007  ? 108; A1c5.4%  ? OSA (obstructive sleep apnea) 08/29/2015  ? CPAP SET ON 10  ? Peripheral vascular disease ([place]) S/P ANGIOPLASTY  AND STENTING  ? FOLLOWED  BY DR [name]  ? Spinal stenosis 06/2014  ? Status post placement of implantable loop recorder   ? Thoracic aorta atherosclerosis ([place])   ? 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 ? ?Past Surgical History:  ?Procedure Laterality Date  ? ANGIO PLASTY    ? X 4 in legs  ? AORTOGRAM  07-27-2002  ? MILD DIFFUSE ILIAC ARTERY OCCLUSIVE DISEASE /  LEFT RENAL ARTERY 20%/ PATENT LEFT FEM-POP GRAFT/ MILD SFA AND POPLITEAL ARTERY OCCLUSIVE DISEASE W/ SEVERE KIDNEY OCCLUSIVE DISEASE  ? ATRIAL FIBRILLATION ABLATION N/A 04/09/2019  ? Procedure: ATRIAL FIBRILLATION ABLATION;  Surgeon: Thompson Grayer, MD;  Location: [place] [facility];  Service: Cardiovascular;  Laterality: N/A;  ? ATRIAL FIBRILLATION ABLATION N/A 02/12/2020  ? Procedure: ATRIAL FIBRILLATION ABLATION;  Surgeon: Thompson Grayer, MD;  Location: [place] [facility];  Service: Cardiovascular;  Laterality: N/A;  ? BALLOON DILATION N/A 02/06/2022  ? Procedure: BALLOON  Tamponade ampulla;  Surgeon: Irving Copas., MD;  Location[name];  Service: Gastroenterology;  Laterality: N/A;  ? BASAL CELL CARCINOMA EXCISION    ? MULTIPLE TIMES--  RIGHT FOREARM, CHEEKS, AND BACK  ? BIOPSY  06/25/2018  ? Procedure: BIOPSY;  Surgeon: Irving Copas., MD;  Location: [place];  Service: Gastroenterology;;  ? BIOPSY  02/06/2022  ? Procedure: BIOPSY;  Surgeon: Irving Copas., MD;  Location: [name];  Service: Gastroenterology;;  ? CARDIOVASCULAR STRESS TEST  12-08-2012  DR MCLEAN  ? NORMAL LEXISCAN WITH NO EXERCISE NUCLEAR STUDY/ EF 66%/   NO ISCHEMIA/ NO SIGNIFICANT CHANGE FROM PRIOR STUDY  ? CARDIOVERSION N/A 02/14/2018  ? Procedure: CARDIOVERSION;  Surgeon: Larey Dresser, MD;  Location: [facility] ENDOSCOPY;  Service: Cardiovascular;  Laterality: N/A;  ? CAROTID ANGIOGRAM N/A 07/10/2013  ? Procedure: CAROTID ANGIOGRAM;  Surgeon: Elam Dutch, MD;  Location: [facility] CATH LAB;  Service: Cardiovascular;  Laterality: N/A;  ? CAROTID  ENDARTERECTOMY Right 07-14-13  ? cea  ? CATARACT EXTRACTION W/ INTRAOCULAR LENS  IMPLANT, BILATERAL    ? colon polyectomy    ? COLONOSCOPY    ? COLONOSCOPY WITH PROPOFOL N/A 06/25/2018  ? Procedure: COLONOSCOPY WITH PROPOF

## 2022-02-12 NOTE — ED Notes (Signed)
Did manual blood pressure got patient undressed into a gown on the monitor patient is resting with nurse at bedside ?

## 2022-02-12 NOTE — Consult Note (Addendum)
Attending physician's note  ? ?I have taken a history, reviewed the chart, and examined the patient. I performed a substantive portion of this encounter, including complete performance of at least one of the key components, in conjunction with the APP. I agree with the APP's note, impression, and recommendations with my edits.  ? ?81 year old male with medical history as below to include history of A-fib (on Eliquis), AAA, BPH, CHF, CKD 3, colon/lung cancer, COPD, HTN, HLD, PVD s/p stenting, presenting to the ER with melena, FOBT positive stool, and acute blood loss anemia.  Patient is legally blind, so he describes melena more as loose stools with increased frequency.  He apparently had a syncopal event in the bathroom today. ? ?- ERCP (02/06/2022): J-shaped stomach with multiple gastric polyps and mild friability (path benign).  Required left lateral positioning to enter duodenal bulb.  Major papilla located on rim of diverticulum.  Difficulty with wire-guided cannulation requiring double wire approach to enter small ampulla.  Still unable to cannulate bile duct, so 4 [place] by 7 cm temporary plastic pancreatic stent was placed in PD.  This then allowed for deep cannulation of the CBD.  Lower third of main bile duct contained filling defects/stones, moderately dilated CBD up to 15 mm.  10 mm sphincterotomy and balloon sweeps with removal of 2 stones.  None remained.  Sphincterotomy bleeding from the apex of the cut after removal of larger stone which was treated with balloon tamponade with 10 mm balloon x4 minutes with cessation of bleeding.  Restarted Eliquis on 5/11 ? ?ER evaluation notable for the following: ?- H/H 8.6/26.3 (from 13.4/40.2 last month), MCV/RDW 96/17 ?- BUN/creatinine 46/1.83 (baseline BUN mid 20s) ?- Albumin 3.1, otherwise normal liver enzymes ?- FOBT positive ?- CT head: No acute intracranial pathology ? ? ? ?1) Acute blood loss anemia ?2) Presumed melena ?3) Change in bowel habits ?4)  Symptomatic anemia ?5) Elevated BUN beyond baseline ?6) Heme positive stool ? ?-Discussed potential DDx for UGIB to include possibly bleeding from sphincterotomy site ?- Hold Eliquis and allow for washout with procedures tomorrow ?- N.p.o. at midnight ?- Plan for upper endoscopy tomorrow.  Will start with EGD scope and exchanged for side-viewing duodenoscope if needed.  Did discuss the possibility of ERCP/biliary cannulation depending on endoscopic findings ?- Continue trending serial H/H with blood products as needed per protocol ? ? ?The indications, risks, and benefits of EGD and ERCP were explained to the patient and daughter at bedside in detail. Risks include but are not limited to bleeding, perforation, adverse reaction to medications, and cardiopulmonary compromise. Sequelae include but are not limited to the possibility of surgery, prolonged hospitalization, pancreatitis, inability to cannulate bile duct, and mortality. The patient verbalized understanding and wished to proceed. All questions answered, referred to scheduler. Further recommendations pending results of the exam.  ? ? ? ?[address] DO, FACG ?[phone] office  ? ?   ? ? ? ? ? ? ? ? ? Consultation ? ?Referring Provider: Dr. Regenia Skeeter     ?Primary Care Physician:  Binnie Rail, MD ?Primary Gastroenterologist:   Dr. Rush Landmark      ?Reason for Consultation:  Melena and Symptomatic Anemia    ?       ? HPI:   ?Troy [name] [name] is a 81 y.o. male with a past medical history as listed below including A-fib on Eliquis, AAA, BPH, chronic diastolic CHF, stage III CKD, colon/lung cancer, COPD, hypertension, hyperlipidemia and PVD status post stents  who presented to the ER today after a fall.  We are consulted in regards to black stool which is Hemoccult positive and anemia. ?   Patient did undergo ERCP 02/06/2022 as below with Dr. Rush Landmark which was technically complicated. ?   Today, patient is seen with his daughter by his bedside who  does assist with history.  He is legally blind and tells me that really ever since time of the procedure he has not felt well at all just fatigued and dizzy anytime he would try and move, also tells me that he has had looser than normal stools until just the other day every time that he would get up to urinate which is "quite a lot because I am on Lasix".  Today when getting up to go to the bathroom he passed out, fell and hit his head which is what prompted his ER visit.  He did restart his Eliquis as told on 02/08/2022.  His last dose was this morning. ?   Denies fever or chills. ? ?GI History: ?02/06/22 ERCP Dr Rush Landmark for bile duct stones with J shaped deformity of stomach, a few gastric polyps biopsied, techinically difficult with eventual sphincterotomy and complete removal of stones, some activing bleeding after balloon trawl-treated with balloon tamponade-instructed to restart Eliquis 5/11 ? ? ?Past Medical History:  ?Diagnosis Date  ? AAA (abdominal aortic aneurysm) ([place]) LAST ABDOMINAL US 10-20-17 3.3 CM  ? MONITORED BY DR [name]  ? Anxiety   ? Arthritis   ? Atrial fibrillation ([place])   ? Basal cell carcinoma   ? BPH (benign prostatic hypertrophy)   ? Chronic diastolic heart failure ([place])   ? Chronic kidney disease   ? stage 3, pt unaware  ? Colon cancer ([place])   ? COPD (chronic obstructive pulmonary disease) ([place])   ? Depression   ? GERD (gastroesophageal reflux disease)   ? Glaucoma BOTH EYES  ? Dr Gershon Crane  ? History of lung cancer APRIL 2012  SQUAMOUS CELL---- S/P RIGHT LOWER LOBECTOMY AT [facility] --  NO CHEMORADIATION---  NO RECURRENCE   ? ONCOLOGIST- DR Patsi Sears IN [facility] 10-27-2012  ? History of pneumothorax   ? pt unaware  ? Hx of adenomatous colonic polyps 2005  ?  X 2; 1 hyperplastic polyp; Dr Olevia Perches  ? Hyperlipidemia   ? Hypertension   ? Impaired fasting glucose 2007  ? 108; A1c5.4%  ? OSA (obstructive sleep apnea) 08/29/2015  ? CPAP SET ON 10  ? Peripheral vascular disease ([place]) S/P ANGIOPLASTY  AND STENTING  ? FOLLOWED  BY DR [name]  ? Spinal stenosis 06/2014  ? Status post placement of implantable loop recorder   ? Thoracic aorta atherosclerosis ([place])   ? 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 ? ?Past Surgical History:  ?Procedure Laterality Date  ? ANGIO PLASTY    ? X 4 in legs  ? AORTOGRAM  07-27-2002  ? MILD DIFFUSE ILIAC ARTERY OCCLUSIVE DISEASE /  LEFT RENAL ARTERY 20%/ PATENT LEFT FEM-POP GRAFT/ MILD SFA AND POPLITEAL ARTERY OCCLUSIVE DISEASE W/ SEVERE KIDNEY OCCLUSIVE DISEASE  ? ATRIAL FIBRILLATION ABLATION N/A 04/09/2019  ? Procedure: ATRIAL FIBRILLATION ABLATION;  Surgeon: Thompson Grayer, MD;  Location: [place] [facility];  Service: Cardiovascular;  Laterality: N/A;  ? ATRIAL FIBRILLATION ABLATION N/A 02/12/2020  ? Procedure: ATRIAL FIBRILLATION ABLATION;  Surgeon: Thompson Grayer, MD;  Location: [place] [facility];  Service: Cardiovascular;  Laterality: N/A;  ? BALLOON DILATION N/A 02/06/2022  ? Procedure: BALLOON  Tamponade ampulla;  Surgeon: Irving Copas., MD;  Location: [name];  Service: Gastroenterology;  Laterality: N/A;  ? BASAL CELL CARCINOMA EXCISION    ? MULTIPLE TIMES--  RIGHT FOREARM, CHEEKS, AND BACK  ? BIOPSY  06/25/2018  ? Procedure: BIOPSY;  Surgeon: Irving Copas., MD;  Location: [facility];  Service: Gastroenterology;;  ? BIOPSY  02/06/2022  ? Procedure: BIOPSY;  Surgeon: Irving Copas., MD;  Location: [name];  Service: Gastroenterology;;  ? CARDIOVASCULAR STRESS TEST  12-08-2012  DR MCLEAN  ? NORMAL LEXISCAN WITH NO EXERCISE NUCLEAR STUDY/ EF 66%/   NO ISCHEMIA/ NO SIGNIFICANT CHANGE FROM PRIOR STUDY  ? CARDIOVERSION N/A 02/14/2018  ? Procedure: CARDIOVERSION;  Surgeon: Larey Dresser, MD;  Location: [facility] ENDOSCOPY;  Service: Cardiovascular;  Laterality: N/A;  ? CAROTID ANGIOGRAM N/A 07/10/2013  ? Procedure: CAROTID ANGIOGRAM;  Surgeon: Elam Dutch, MD;  Location: [facility] CATH LAB;  Service: Cardiovascular;  Laterality: N/A;  ? CAROTID  ENDARTERECTOMY Right 07-14-13  ? cea  ? CATARACT EXTRACTION W/ INTRAOCULAR LENS  IMPLANT, BILATERAL    ? colon polyectomy    ? COLONOSCOPY    ? COLONOSCOPY WITH PROPOFOL N/A 06/25/2018  ? Procedure: COLONOSCOPY WITH PROPOF

## 2022-02-12 NOTE — ED Notes (Signed)
X-ray at bedside

## 2022-02-12 NOTE — ED Triage Notes (Signed)
Fall from standing position due to dizzy and weakness hitting head on toilet with small lac to forehead.  Denies LOC ?

## 2022-02-12 NOTE — Progress Notes (Signed)
Orthopedic Tech Progress Note ?Patient Details:  ?Troy Macdonnell [name] ?11/03/1940 ?[id number] ? ?Level 2 trauma  ? ?Patient ID: Troy Covin [name], male   DOB: August 30, 1941, 81 y.o.   MRN: [id number] ? ?Troy [name] ?02/12/2022, 1:23 PM ? ?

## 2022-02-13 ENCOUNTER — Encounter (HOSPITAL_COMMUNITY)
Admission: EM | Disposition: A | Discharge status: Skilled Nursing Facility | Payer: Self-pay | Source: Home / Self Care | Attending: Internal Medicine

## 2022-02-13 ENCOUNTER — Observation Stay (HOSPITAL_COMMUNITY): Payer: PPO | Admitting: Anesthesiology

## 2022-02-13 ENCOUNTER — Telehealth: Payer: Self-pay | Admitting: Gastroenterology

## 2022-02-13 ENCOUNTER — Encounter (HOSPITAL_COMMUNITY): Payer: Self-pay | Admitting: Internal Medicine

## 2022-02-13 DIAGNOSIS — K264 Chronic or unspecified duodenal ulcer with hemorrhage: Secondary | ICD-10-CM

## 2022-02-13 DIAGNOSIS — K317 Polyp of stomach and duodenum: Secondary | ICD-10-CM | POA: Diagnosis not present

## 2022-02-13 DIAGNOSIS — R58 Hemorrhage, not elsewhere classified: Secondary | ICD-10-CM | POA: Diagnosis not present

## 2022-02-13 DIAGNOSIS — I5032 Chronic diastolic (congestive) heart failure: Secondary | ICD-10-CM | POA: Diagnosis present

## 2022-02-13 DIAGNOSIS — N1832 Chronic kidney disease, stage 3b: Secondary | ICD-10-CM | POA: Diagnosis present

## 2022-02-13 DIAGNOSIS — I130 Hypertensive heart and chronic kidney disease with heart failure and stage 1 through stage 4 chronic kidney disease, or unspecified chronic kidney disease: Secondary | ICD-10-CM | POA: Diagnosis present

## 2022-02-13 DIAGNOSIS — E538 Deficiency of other specified B group vitamins: Secondary | ICD-10-CM

## 2022-02-13 DIAGNOSIS — E1151 Type 2 diabetes mellitus with diabetic peripheral angiopathy without gangrene: Secondary | ICD-10-CM | POA: Diagnosis present

## 2022-02-13 DIAGNOSIS — I480 Paroxysmal atrial fibrillation: Secondary | ICD-10-CM | POA: Diagnosis present

## 2022-02-13 DIAGNOSIS — Y92009 Unspecified place in unspecified non-institutional (private) residence as the place of occurrence of the external cause: Secondary | ICD-10-CM | POA: Diagnosis not present

## 2022-02-13 DIAGNOSIS — K269 Duodenal ulcer, unspecified as acute or chronic, without hemorrhage or perforation: Secondary | ICD-10-CM | POA: Diagnosis not present

## 2022-02-13 DIAGNOSIS — K2971 Gastritis, unspecified, with bleeding: Secondary | ICD-10-CM | POA: Diagnosis not present

## 2022-02-13 DIAGNOSIS — G4733 Obstructive sleep apnea (adult) (pediatric): Secondary | ICD-10-CM | POA: Diagnosis present

## 2022-02-13 DIAGNOSIS — D649 Anemia, unspecified: Secondary | ICD-10-CM

## 2022-02-13 DIAGNOSIS — R195 Other fecal abnormalities: Secondary | ICD-10-CM | POA: Diagnosis not present

## 2022-02-13 DIAGNOSIS — Z23 Encounter for immunization: Secondary | ICD-10-CM | POA: Diagnosis not present

## 2022-02-13 DIAGNOSIS — H409 Unspecified glaucoma: Secondary | ICD-10-CM

## 2022-02-13 DIAGNOSIS — J90 Pleural effusion, not elsewhere classified: Secondary | ICD-10-CM | POA: Diagnosis not present

## 2022-02-13 DIAGNOSIS — R0602 Shortness of breath: Secondary | ICD-10-CM | POA: Diagnosis not present

## 2022-02-13 DIAGNOSIS — D62 Acute posthemorrhagic anemia: Secondary | ICD-10-CM

## 2022-02-13 DIAGNOSIS — J9811 Atelectasis: Secondary | ICD-10-CM | POA: Diagnosis not present

## 2022-02-13 DIAGNOSIS — N183 Chronic kidney disease, stage 3 unspecified: Secondary | ICD-10-CM | POA: Diagnosis not present

## 2022-02-13 DIAGNOSIS — J9601 Acute respiratory failure with hypoxia: Secondary | ICD-10-CM | POA: Diagnosis not present

## 2022-02-13 DIAGNOSIS — K219 Gastro-esophageal reflux disease without esophagitis: Secondary | ICD-10-CM | POA: Diagnosis present

## 2022-02-13 DIAGNOSIS — E119 Type 2 diabetes mellitus without complications: Secondary | ICD-10-CM

## 2022-02-13 DIAGNOSIS — F32A Depression, unspecified: Secondary | ICD-10-CM | POA: Diagnosis present

## 2022-02-13 DIAGNOSIS — I10 Essential (primary) hypertension: Secondary | ICD-10-CM | POA: Diagnosis not present

## 2022-02-13 DIAGNOSIS — F419 Anxiety disorder, unspecified: Secondary | ICD-10-CM | POA: Diagnosis present

## 2022-02-13 DIAGNOSIS — E785 Hyperlipidemia, unspecified: Secondary | ICD-10-CM

## 2022-02-13 DIAGNOSIS — K5710 Diverticulosis of small intestine without perforation or abscess without bleeding: Secondary | ICD-10-CM | POA: Diagnosis not present

## 2022-02-13 DIAGNOSIS — N400 Enlarged prostate without lower urinary tract symptoms: Secondary | ICD-10-CM | POA: Diagnosis present

## 2022-02-13 DIAGNOSIS — E1122 Type 2 diabetes mellitus with diabetic chronic kidney disease: Secondary | ICD-10-CM | POA: Diagnosis present

## 2022-02-13 DIAGNOSIS — R079 Chest pain, unspecified: Secondary | ICD-10-CM | POA: Diagnosis not present

## 2022-02-13 DIAGNOSIS — K2970 Gastritis, unspecified, without bleeding: Secondary | ICD-10-CM | POA: Diagnosis not present

## 2022-02-13 DIAGNOSIS — Z743 Need for continuous supervision: Secondary | ICD-10-CM | POA: Diagnosis not present

## 2022-02-13 DIAGNOSIS — K921 Melena: Secondary | ICD-10-CM | POA: Diagnosis not present

## 2022-02-13 DIAGNOSIS — I714 Abdominal aortic aneurysm, without rupture, unspecified: Secondary | ICD-10-CM

## 2022-02-13 DIAGNOSIS — S0181XA Laceration without foreign body of other part of head, initial encounter: Secondary | ICD-10-CM | POA: Diagnosis present

## 2022-02-13 DIAGNOSIS — D638 Anemia in other chronic diseases classified elsewhere: Secondary | ICD-10-CM | POA: Diagnosis present

## 2022-02-13 DIAGNOSIS — J449 Chronic obstructive pulmonary disease, unspecified: Secondary | ICD-10-CM | POA: Diagnosis present

## 2022-02-13 DIAGNOSIS — R531 Weakness: Secondary | ICD-10-CM | POA: Diagnosis not present

## 2022-02-13 DIAGNOSIS — W19XXXA Unspecified fall, initial encounter: Secondary | ICD-10-CM

## 2022-02-13 DIAGNOSIS — H548 Legal blindness, as defined in USA: Secondary | ICD-10-CM | POA: Diagnosis present

## 2022-02-13 DIAGNOSIS — K5711 Diverticulosis of small intestine without perforation or abscess with bleeding: Secondary | ICD-10-CM | POA: Diagnosis not present

## 2022-02-13 DIAGNOSIS — Z66 Do not resuscitate: Secondary | ICD-10-CM | POA: Diagnosis not present

## 2022-02-13 DIAGNOSIS — M1990 Unspecified osteoarthritis, unspecified site: Secondary | ICD-10-CM | POA: Diagnosis present

## 2022-02-13 HISTORY — PX: ESOPHAGOGASTRODUODENOSCOPY: SHX5428

## 2022-02-13 HISTORY — PX: HOT HEMOSTASIS: SHX5433

## 2022-02-13 HISTORY — PX: SCLEROTHERAPY: SHX6841

## 2022-02-13 HISTORY — PX: HEMOSTASIS CLIP PLACEMENT: SHX6857

## 2022-02-13 HISTORY — PX: POLYPECTOMY: SHX5525

## 2022-02-13 LAB — BASIC METABOLIC PANEL
Anion gap: 9 (ref 5–15)
BUN: 36 mg/dL — ABNORMAL HIGH (ref 8–23)
CO2: 22 mmol/L (ref 22–32)
Calcium: 8.4 mg/dL — ABNORMAL LOW (ref 8.9–10.3)
Chloride: 106 mmol/L (ref 98–111)
Creatinine, Ser: 1.44 mg/dL — ABNORMAL HIGH (ref 0.61–1.24)
GFR, Estimated: 49 mL/min — ABNORMAL LOW (ref >60)
Glucose, Bld: 129 mg/dL — ABNORMAL HIGH (ref 70–99)
Potassium: 3.7 mmol/L (ref 3.5–5.1)
Sodium: 137 mmol/L (ref 135–145)

## 2022-02-13 LAB — CBC
HCT: 22.6 % — ABNORMAL LOW (ref 39.0–52.0)
HCT: 21.4 % — ABNORMAL LOW (ref 39.0–52.0)
Hemoglobin: 7.5 g/dL — ABNORMAL LOW (ref 13.0–17.0)
Hemoglobin: 6.7 g/dL — CL (ref 13.0–17.0)
MCH: 31.1 pg (ref 26.0–34.0)
MCH: 30.2 pg (ref 26.0–34.0)
MCHC: 33.2 g/dL (ref 30.0–36.0)
MCHC: 31.3 g/dL (ref 30.0–36.0)
MCV: 93.8 fL (ref 80.0–100.0)
MCV: 96.4 fL (ref 80.0–100.0)
Platelets: 214 10*3/uL (ref 150–400)
Platelets: 227 10*3/uL (ref 150–400)
RBC: 2.41 MIL/uL — ABNORMAL LOW (ref 4.22–5.81)
RBC: 2.22 MIL/uL — ABNORMAL LOW (ref 4.22–5.81)
RDW: 17.2 % — ABNORMAL HIGH (ref 11.5–15.5)
RDW: 17.8 % — ABNORMAL HIGH (ref 11.5–15.5)
WBC: 6.9 10*3/uL (ref 4.0–10.5)
WBC: 6.9 10*3/uL (ref 4.0–10.5)
nRBC: 0 % (ref 0.0–0.2)
nRBC: 0.3 % — ABNORMAL HIGH (ref 0.0–0.2)

## 2022-02-13 LAB — HEMOGLOBIN AND HEMATOCRIT, BLOOD
HCT: 22.6 % — ABNORMAL LOW (ref 39.0–52.0)
Hemoglobin: 7.4 g/dL — ABNORMAL LOW (ref 13.0–17.0)

## 2022-02-13 LAB — PREPARE RBC (CROSSMATCH)

## 2022-02-13 SURGERY — EGD (ESOPHAGOGASTRODUODENOSCOPY)
Anesthesia: Monitor Anesthesia Care

## 2022-02-13 MED ORDER — PANTOPRAZOLE SODIUM 40 MG IV SOLR
40.0000 mg | Freq: Two times a day (BID) | INTRAVENOUS | Status: DC
Start: 2022-02-13 — End: 2022-02-14
  Administered 2022-02-13 – 2022-02-14 (×2): 40 mg via INTRAVENOUS
  Filled 2022-02-13 (×2): qty 10

## 2022-02-13 MED ORDER — DIPHENHYDRAMINE HCL 25 MG PO CAPS
25.0000 mg | ORAL_CAPSULE | Freq: Once | ORAL | Status: AC
  Administered 2022-02-13: 25 mg via ORAL
  Filled 2022-02-13: qty 1

## 2022-02-13 MED ORDER — ACETAMINOPHEN 325 MG PO TABS
650.0000 mg | ORAL_TABLET | Freq: Once | ORAL | Status: AC
  Administered 2022-02-13: 650 mg via ORAL
  Filled 2022-02-13: qty 2

## 2022-02-13 MED ORDER — PANTOPRAZOLE SODIUM 40 MG PO TBEC
40.0000 mg | DELAYED_RELEASE_TABLET | Freq: Two times a day (BID) | ORAL | Status: DC
  Administered 2022-02-13: 40 mg via ORAL
  Filled 2022-02-13: qty 1

## 2022-02-13 MED ORDER — PROPOFOL 500 MG/50ML IV EMUL
INTRAVENOUS | Status: DC | PRN
  Administered 2022-02-13: 100 ug/kg/min via INTRAVENOUS

## 2022-02-13 MED ORDER — SODIUM CHLORIDE 0.9% IV SOLUTION: Freq: Once | INTRAVENOUS | Status: AC

## 2022-02-13 MED ORDER — GLUCAGON HCL RDNA (DIAGNOSTIC) 1 MG IJ SOLR
INTRAMUSCULAR | Status: DC | PRN
  Administered 2022-02-13: .5 mg via INTRAVENOUS

## 2022-02-13 MED ORDER — PROPOFOL 10 MG/ML IV BOLUS
INTRAVENOUS | Status: DC | PRN
  Administered 2022-02-13: 10 mg via INTRAVENOUS
  Administered 2022-02-13: 20 mg via INTRAVENOUS
  Administered 2022-02-13: 10 mg via INTRAVENOUS

## 2022-02-13 MED ORDER — FUROSEMIDE 10 MG/ML IJ SOLN
20.0000 mg | Freq: Once | INTRAMUSCULAR | Status: AC
  Administered 2022-02-13: 20 mg via INTRAVENOUS

## 2022-02-13 MED ORDER — CYANOCOBALAMIN 1000 MCG/ML IJ SOLN
1000.0000 ug | Freq: Every day | INTRAMUSCULAR | Status: DC
  Administered 2022-02-14 – 2022-02-16 (×3): 1000 ug via SUBCUTANEOUS
  Filled 2022-02-13 (×5): qty 1

## 2022-02-13 NOTE — Progress Notes (Signed)
Received call from lab regarding a critical lab on the patient of 6.7. Two orders of blood already ordered and one currently infusing. MD notified.  ? ?Troy [name] E  ?

## 2022-02-13 NOTE — Op Note (Signed)
[facility] ?Patient Name: Troy [name] ?Procedure Date : 02/13/2022 ?MRN: [id number] ?Attending MD: Gerrit Heck , MD ?Date of Birth: 19-Jun-1941 ?CSN: [id number] ?Age: 81 ?Admit Type: Inpatient ?Procedure:                Upper GI endoscopy ?Indications:              Acute post hemorrhagic anemia, Heme positive stool,  ?                          Melena ?                          ERCP (02/06/2022): J-shaped stomach with multiple  ?                          gastric polyps and mild friability (path benign).  ?                          Required left lateral positioning to enter duodenal  ?                          bulb. Major papilla located on rim of diverticulum.  ?                          Difficulty with wire-guided cannulation requiring  ?                          double wire approach to enter small ampulla. Still  ?                          unable to cannulate bile duct, so 4 [place] by 7 cm  ?                          temporary plastic pancreatic stent was placed in  ?                          PD. This then allowed for deep cannulation of the  ?                          CBD. Lower third of main bile duct contained  ?                          filling defects/stones, moderately dilated CBD up  ?                          to 15 mm. 10 mm sphincterotomy and balloon sweeps  ?                          with removal of 2 stones. None remained.  ?                          Sphincterotomy bleeding from the apex of the cut  ?  after removal of larger stone which was treated  ?                          with balloon tamponade with 10 mm balloon x4  ?                          minutes with cessation of bleeding. Restarted  ?                          Eliquis on 5/11 ?                          Admission Hgb 8.6 (from baseline 13), with FOBT+  ?                          stools, symptomatic anemia (fatigue, weakness, fall  ?                          at home), with melenic stool. Normal iron  studies.  ?                          Eliquis held on admission. ?Providers:                Gerrit Heck, MD, Glori Bickers, RN, Cletis Athens,  ?                          Technician, Claybon Jabs CRNA, CRNA ?Referring MD:              ?Medicines:                Monitored Anesthesia Care ?Complications:            No immediate complications. ?Estimated Blood Loss:     Estimated blood loss was minimal. ?Procedure:                Pre-Anesthesia Assessment: ?                          - Prior to the procedure, a History and Physical  ?                          was performed, and patient medications and  ?                          allergies were reviewed. The patient's tolerance of  ?                          previous anesthesia was also reviewed. The risks  ?                          and benefits of the procedure and the sedation  ?                          options and risks were discussed with the patient.  ?  All questions were answered, and informed consent  ?                          was obtained. Prior Anticoagulants: The patient has  ?                          taken Eliquis (apixaban), last dose was 1 day prior  ?                          to procedure. ASA Grade Assessment: III - A patient  ?                          with severe systemic disease. After reviewing the  ?                          risks and benefits, the patient was deemed in  ?                          satisfactory condition to undergo the procedure. ?                          After obtaining informed consent, the endoscope was  ?                          passed under direct vision. Throughout the  ?                          procedure, the patient's blood pressure, pulse, and  ?                          oxygen saturations were monitored continuously. The  ?                          GIF-H190 ([id number]) Olympus endoscope was introduced  ?                          through the mouth, and advanced to the second part  ?                           of duodenum. The upper GI endoscopy was  ?                          accomplished without difficulty. The patient  ?                          tolerated the procedure well. ?Scope In: ?Scope Out: ?Findings: ?     The examined esophagus was normal. ?     Multiple small sessile polyps were found in the gastric fundus and in  ?     the gastric body. One 4 mm polyp had active oozing. While the more  ?     likely culprit bleeding lesion was the sphincterotomy site ulcer below,  ?     given the active oozing, anemia, and need to eventually restart  ?  anticoagulation, the polyp was removed with a cold snare then treated  ?     with a single hemostatic clip with closure and cessation of bleeding.  ?     Resection and retrieval were complete. There was no bleeding at the end  ?     of the procedure. ?     Mild inflammation characterized by congestion (edema) and erythema was  ?     found in the gastric fundus and in the gastric body. This was previously  ?     biopsied and benign. ?     One non-bleeding duodenal ulcer with a visible vessel was found at the  ?     major papilla. The lesion was 3 mm in largest dimension. Area was  ?     successfully injected with 3 mL of a 1:10,000 solution of epinephrine  ?     for hemostasis with appropriate mucosal blanching. Coagulation for  ?     hemostasis using bicap heater probe was successful. ?     A non-bleeding diverticulum was found in the second portion of the  ?     duodenum. This was lavaged and had a clean base without stigmata of  ?     bleeding. A pancreatic stent was noted in position. The remainder of the  ?     visualized duodenum was normal appearing. ?Impression:               - Normal esophagus. ?                          - Multiple gastric polyps. One was actively oozing  ?                          upon endoscope insertion. Resected with a cold  ?                          snare then clip closed with one hemostatic clip. ?                          -  Gastritis. ?                          - Non-bleeding duodenal ulcer with a visible vessel  ?                          located at the major papilla. Injected with  ?                          epinephrine then treated with a bicap heater probe. ?                          - Non-bleeding duodenal diverticulum. ?Recommendation:           - Return patient to hospital ward for ongoing care. ?                          - Full liquid diet today, and if no sign of  ?  rebleeding, can advance diet as tolerated tomorrow. ?                          - Continue serial Hgb/Hct checks. ?                          - Continue holding Eliquis today. Will engage with  ?                          the Hospitalist service regarding prolonged hold to  ?                          allow continued mucosal healing. ?                          - Continue other medications. ?                          - Await pathology results. ?                          - Based on appearance and positioning, suspect the  ?                          pancreatic stent will appropriately fall out in the  ?                          next couple of days. Will obtain KUB prior to  ?                          discharge to confirm. ?                          - GI service will continue to follow. ?Procedure Code(s):        --- Professional --- ?                          93570, 59, Esophagogastroduodenoscopy, flexible,  ?                          transoral; with control of bleeding, any method ?                          43251, Esophagogastroduodenoscopy, flexible,  ?                          transoral; with removal of tumor(s), polyp(s), or  ?                          other lesion(s) by snare technique ?Diagnosis Code(s):        --- Professional --- ?                          K31.7, Polyp of stomach and duodenum ?  K29.70, Gastritis, unspecified, without bleeding ?                          K26.4, Chronic or unspecified duodenal ulcer  with  ?                          hemorrhage ?                          D62, Acute posthemorrhagic anemia ?                          R19.5, Other fecal abnormalities ?                          K92.1, Melena (include

## 2022-02-13 NOTE — Telephone Encounter (Signed)
Spoke with pt's daughter and let her know that since the pt is currently admitted, she could ask the pt's nurse on the floor to page Dr. Bryan Lemma.  ?

## 2022-02-13 NOTE — Anesthesia Postprocedure Evaluation (Signed)
Anesthesia Post Note ? ?Patient: Troy Wenzlick [name] ? ?Procedure(s) Performed: ESOPHAGOGASTRODUODENOSCOPY (EGD) (Left) ?POLYPECTOMY ?HOT HEMOSTASIS (ARGON PLASMA COAGULATION/BICAP) ?SCLEROTHERAPY ?HEMOSTASIS CLIP PLACEMENT ? ?  ? ?Patient location during evaluation: PACU ?Anesthesia Type: MAC ?Level of consciousness: awake and alert ?Pain management: pain level controlled ?Vital Signs Assessment: post-procedure vital signs reviewed and stable ?Respiratory status: spontaneous breathing, nonlabored ventilation and respiratory function stable ?Cardiovascular status: stable and blood pressure returned to baseline ?Anesthetic complications: no ? ? ?No notable events documented. ? ?Last Vitals:  ?Vitals:  ? 02/13/22 1200 02/13/22 1303  ?BP: 128/62   ?Pulse: 70   ?Resp: 19   ?Temp: 36.7 ?C 36.7 ?C  ?SpO2: 100%   ?  ?Last Pain:  ?Vitals:  ? 02/13/22 1303  ?TempSrc: Oral  ?PainSc:   ? ? ?  ?  ?  ?  ?  ?  ? ?Audry Pili ? ? ? ? ?

## 2022-02-13 NOTE — Interval H&P Note (Signed)
History and Physical Interval Note: ? ?02/13/2022 ?10:36 AM ? ?Troy [facility] [name]  has presented today for surgery, with the diagnosis of Upper GI bleed, acute blood loss anemia.  The various methods of treatment have been discussed with the patient and family. After consideration of risks, benefits and other options for treatment, the patient has consented to  Procedure(s): ?ESOPHAGOGASTRODUODENOSCOPY (EGD) (Left) as a surgical intervention.  The patient's history has been reviewed, patient examined, no change in status, stable for surgery.  I have reviewed the patient's chart and labs.  Questions were answered to the patient's satisfaction.   ? ? ?Singleton Hickox V Mylah Baynes ? ? ?

## 2022-02-13 NOTE — Anesthesia Procedure Notes (Signed)
Procedure Name: [place] ?Date/Time: 02/13/2022 10:55 AM ?Performed by: Moshe Salisbury, CRNA ?Pre-anesthesia Checklist: Patient identified, Emergency Drugs available, Suction available and Patient being monitored ?Patient Re-evaluated:Patient Re-evaluated prior to induction ?Oxygen Delivery Method: Nasal cannula ?Placement Confirmation: positive ETCO2 ?Dental Injury: Teeth and Oropharynx as per pre-operative assessment  ? ? ? ? ?

## 2022-02-13 NOTE — Anesthesia Preprocedure Evaluation (Addendum)
Anesthesia Evaluation  ?Patient identified by MRN, date of birth, ID band ?Patient awake ? ? ? ?Reviewed: ?Allergy & Precautions, NPO status , Patient's Chart, lab work & pertinent test results, reviewed documented beta blocker date and time  ? ?History of Anesthesia Complications ?Negative for: history of anesthetic complications ? ?Airway ?Mallampati: II ? ?TM Distance: >3 FB ?Neck ROM: Full ? ? ? Dental ? ?(+) Dental Advisory Given ?  ?Pulmonary ?sleep apnea and Continuous Positive Airway Pressure Ventilation , COPD,  COPD inhaler, former smoker,  ? ?Lung ca s/p lobectomy ? ?  ?Pulmonary exam normal ? ? ? ? ? ? ? Cardiovascular ?hypertension, Pt. on medications and Pt. on home beta blockers ?+ CAD and + Peripheral Vascular Disease  ?Normal cardiovascular exam+ Valvular Problems/Murmurs  ? ? ?'22 TTE - EF 55 to 60%. There is moderate left ventricular hypertrophy. Trivial mitral valve regurgitation. Tricuspid valve regurgitation is mild to moderate.  ? ?  ?Neuro/Psych ?PSYCHIATRIC DISORDERS Anxiety Depression TIA  ? GI/Hepatic ?Neg liver ROS, GERD  Medicated and Controlled,  ?Endo/Other  ?diabetes ? Renal/GU ?CRFRenal disease  ? ?  ?Musculoskeletal ? ?(+) Arthritis ,  ? Abdominal ?  ?Peds ? Hematology ? ?(+) Blood dyscrasia, anemia ,  ?On eliquis ?   ?Anesthesia Other Findings ? ? Reproductive/Obstetrics ? ?  ? ? ? ? ? ? ? ? ? ? ? ? ? ?  ?  ? ? ? ? ? ? ? ?Anesthesia Physical ?Anesthesia Plan ? ?ASA: 3 ? ?Anesthesia Plan: MAC  ? ?Post-op Pain Management: Minimal or no pain anticipated  ? ?Induction:  ? ?PONV Risk Score and Plan: 1 and Propofol infusion and Treatment may vary due to age or medical condition ? ?Airway Management Planned: Nasal Cannula and Natural Airway ? ?Additional Equipment: None ? ?Intra-op Plan:  ? ?Post-operative Plan:  ? ?Informed Consent: I have reviewed the patients History and Physical, chart, labs and discussed the procedure including the risks, benefits  and alternatives for the proposed anesthesia with the patient or authorized representative who has indicated his/her understanding and acceptance.  ? ?Patient has DNR.  ?Discussed DNR with patient and Suspend DNR. ?  ? ? ?Plan Discussed with: CRNA and Anesthesiologist ? ?Anesthesia Plan Comments:   ? ? ? ? ?Anesthesia Quick Evaluation ? ?

## 2022-02-13 NOTE — Progress Notes (Deleted)
Patient has home CPAP set up at bedside. RT inspected CPAP. No issues noted. Patient stated she will apply CPAP herself when she is ready to sleep. RT instructed patient to have RT called if she needs assistance. ?

## 2022-02-13 NOTE — Progress Notes (Signed)
?PROGRESS NOTE ? ? ? ?Troy Marsalis [name]  MGQ:[id number] DOB: [birth year] DOA: 02/12/2022 ?PCP: Binnie Rail, MD  ? ? ?Chief Complaint  ?Patient presents with  ? Trauma  ? ? ?Brief Narrative:  ?Patient is an 81 year old gentleman history of AAA, A-fib on chronic anticoagulation with Eliquis, BPH, chronic diastolic CHF, CKD stage IIIb, glaucoma with severe visual impairment, colon/lung cancer, COPD, hypertension, hyperlipidemia, PVD status post stents presented with a fall.  Patient noted to have had an ERCP done on 02/06/2022 and noted that sedation made him constipated.  Patient noted he was having stools that was soft and dark.  Patient noted to have been discharged home feeling like hell and had been in bed since then initially getting over anesthesia and then generalized weakness.  Patient with weakness, dizziness with decreased appetite.  Patient noted on day of admission presented to go to the bathroom got lightheaded, woozy fell in the bathroom denies any loss of consciousness.  Hit his forehead and had a few minor scrapes.  Patient seen in the ED noted to have a hemoglobin of 8 from 13, positive melena, placed on PPI, GI consulted.  Patient admitted, seen by GI underwent upper endoscopy 02/13/2022.  Hemoglobin continued to drop and patient transfused 2 units packed red blood cells. ? ? ?Assessment & Plan: ?  ?Principal Problem: ?  Occult GI bleeding ?Active Problems: ?  Diabetes mellitus without complication ([place]) ?  AAA (abdominal aortic aneurysm) ([place]) ?  Chronic renal disease, stage 3, moderately decreased glomerular filtration rate between 30-59 mL/min/1.73 square meter ([facility]) ?  COPD GOLD II  ?  Symptomatic anemia ?  Glaucoma ?  PAF (paroxysmal atrial fibrillation) ([place]) ?  Hyperlipidemia ?  Fall at home, initial encounter ?  Vitamin B12 deficiency ?  Gastric polyps ?  Duodenal diverticulum ?  Duodenal ulcer ? ?#1 upper GI bleed/acute blood loss anemia/nonbleeding duodenal ulcer with visible vessel,  duodenal diverticulum, multiple gastric polyps ?-Patient had presented with near syncopal episode, noted to be anemic, positive FOBT, concern for melanotic stools, hemoglobin noted at 8.2 from 13.4 on 01/25/2022. ?-Patient noted to have had a recent ERCP done and initial concern was for traumatic injury from ERCP with mild bleeding in the setting of anticoagulation with Eliquis. ?-Patient seen in consultation by GI, patient placed on IV PPI twice daily, placed on IV fluids, made n.p.o. and subsequently underwent upper endoscopy earlier today 02/13/2022 which showed multiple gastric polyps 1 actively oozing status post resection, nonbleeding duodenal ulcer with visible vessel noted status post epinephrine and cautery, nonbleeding duodenal diverticulum. ?-Hemoglobin trickled down as low as 6.7. ?-Anemia panel consistent with anemia of chronic disease. ?-2 units packed red blood cells ordered for transfusion. ?-Continue IV PPI twice daily. ?-Continue to hold Eliquis and could likely resume once cleared by GI hopefully in the next 48 hours. ?-Follow H&H. ?-Transfusion threshold hemoglobin < 8. ?-GI. ? ?2.  Vitamin B12 deficiency ?-Vitamin B12 levels at 166. ?-Vitamin B12 1000 mcg subcu daily during the hospitalization and can transition to oral vitamin B12 supplementation on discharge. ? ?3.  Paroxysmal atrial fibrillation ?-Continue metoprolol for rate control. ?-Hold Eliquis secondary to problem #1. ?-Defer resumption of anticoagulation to gastroenterology. ? ?4.  Fall ?-Patient with scattered abrasions and bruises. ?-Status post laceration repair to forehead. ?-CT head CT C-spine negative for any acute abnormalities. ? ?5.  Hypertension ?-Blood pressure noted to be soft on admission. ?-Continue to hold home regimen Norvasc, Cozaar. ?-Continue metoprolol. ? ?6.  Hyperlipidemia ?-  Continue Crestor, Vascepa. ? ?7.  Diabetes mellitus type 2 ?-Hemoglobin A1c 6.7 (01/10/2022). ?--Hold home regimen Jardiance ?- SSI. ? ?8.   Glaucoma ?-Patient with advanced visual impairment ?-Continue Latanoprost, Simbrinza. ? ?9.  AAA ?-Outpatient follow-up. ?-Continue beta-blocker. ? ?10.  Chronic diastolic CHF ?-Continue to hold Lasix for now. ?-Was on IV fluids which we will discontinue as patient being transfused 2 units packed red blood cells. ?-IV Lasix in between transfusion units. ?-Continue beta-blocker. ? ?11.  Mood disorder ?-Lexapro. ? ?12.  CKD stage IIIb ?-Stable. ? ? ?DVT prophylaxis: SCDs ?Code Status: DNR ?Family Communication: Updated patient and daughter at bedside ?Disposition: TBD ? ?Status is: Inpatient ?Remains inpatient appropriate because: Severity of illness ?  ?Consultants:  ?Gastroenterology: Dr. Bryan Lemma 02/12/2022 ? ?Procedures:  ?Upper endoscopy 02/13/2022 per Dr. Bryan Lemma gastroenterology. ?CT head CT C-spine 02/12/2022 ?Chest x-ray 02/12/2022 ?Transfusion 2 units packed red blood cells 02/13/2022 ? ? ? ?Antimicrobials:  ?None ? ? ?Subjective: ?Patient laying in bed.  Just returned from endoscopy.  Overall feels much better than he did on presentation.  No chest pain.  No shortness of breath.  Unsure whether he has had melanotic bowel movements as has some blindness.  Daughter at bedside. ? ?Objective: ?Vitals:  ? 02/13/22 1200 02/13/22 1303 02/13/22 1645 02/13/22 1714  ?BP: 128/62  (!) 150/55 (!) 138/47  ?Pulse: 70  69 62  ?Resp: 19  16 19   ?Temp: 98 ?F (36.7 ?C) 98 ?F (36.7 ?C) 98.4 ?F (36.9 ?C) 97.8 ?F (36.6 ?C)  ?TempSrc:  Oral Oral Oral  ?SpO2: 100%  96% 95%  ?Weight:      ?Height:      ? ? ?Intake/Output Summary (Last 24 hours) at 02/13/2022 1818 ?Last data filed at 02/13/2022 1126 ?Gross per 24 hour  ?Intake 1307.28 ml  ?Output 1070 ml  ?Net 237.28 ml  ? ?Filed Weights  ? 02/12/22 1315  ?Weight: 87.5 kg  ? ? ?Examination: ? ?General exam: Appears calm and comfortable  ?Respiratory system: Clear to auscultation anterior lung fields.  No wheezes, no crackles, no rhonchi. Respiratory effort normal. ?Cardiovascular  system: S1 & S2 heard, RRR. No JVD, murmurs, rubs, gallops or clicks. No pedal edema. ?Gastrointestinal system: Abdomen is nondistended, soft and nontender. No organomegaly or masses felt. Normal bowel sounds heard. ?Central nervous system: Alert and oriented. No focal neurological deficits. ?Extremities: Symmetric 5 x 5 power. ?Skin: No rashes, lesions or ulcers ?Psychiatry: Judgement and insight appear normal. Mood & affect appropriate.  ? ? ? ?Data Reviewed: I have personally reviewed following labs and imaging studies ? ?CBC: ?Recent Labs  ?Lab 02/12/22 ?1320 02/12/22 ?1721 02/13/22 ?0236 02/13/22 ?1248 02/13/22 ?1638  ?WBC 7.3  --  6.9  --  6.9  ?NEUTROABS 6.3  --   --   --   --   ?HGB 8.6* 7.8* 7.5* 7.4* 6.7*  ?HCT 26.3*  --  22.6* 22.6* 21.4*  ?MCV 96.3  --  93.8  --  96.4  ?PLT 254  --  214  --  227  ? ? ?Basic Metabolic Panel: ?Recent Labs  ?Lab 02/12/22 ?1320 02/13/22 ?0236  ?NA 137 137  ?K 4.4 3.7  ?CL 105 106  ?CO2 20* 22  ?GLUCOSE 216* 129*  ?BUN 46* 36*  ?CREATININE 1.83* 1.44*  ?CALCIUM 8.6* 8.4*  ? ? ?GFR: ?Estimated Creatinine Clearance: 44.2 mL/min (A) (by C-G formula based on SCr of 1.44 mg/dL (H)). ? ?Liver Function Tests: ?Recent Labs  ?Lab 02/12/22 ?1320  ?AST 28  ?  ALT 19  ?ALKPHOS 84  ?BILITOT 1.0  ?PROT 5.5*  ?ALBUMIN 3.1*  ? ? ?CBG: ?No results for input(s): GLUCAP in the last 168 hours. ? ? ?No results found for this or any previous visit (from the past 240 hour(s)).  ? ? ? ? ? ?Radiology Studies: ?CT Head Wo Contrast ? ?Result Date: 02/12/2022 ?CLINICAL DATA:  Fall, head and neck trauma EXAM: CT HEAD WITHOUT CONTRAST CT CERVICAL SPINE WITHOUT CONTRAST TECHNIQUE: Multidetector CT imaging of the head and cervical spine was performed following the standard protocol without intravenous contrast. Multiplanar CT image reconstructions of the cervical spine were also generated. RADIATION DOSE REDUCTION: This exam was performed according to the departmental dose-optimization program which includes  automated exposure control, adjustment of the mA and/or kV according to patient size and/or use of iterative reconstruction technique. COMPARISON:  None Available. CT head dated June 23, 2018 FINDINGS: CT HE

## 2022-02-13 NOTE — Telephone Encounter (Signed)
Inbound call from patients daughter stating that he had a procedure today at the hospital and she never got a " talk" with Dr. Marlynn Perking. Patients daughter is seeking advise about patient procedure. Please advise.   ?

## 2022-02-13 NOTE — Transfer of Care (Signed)
Immediate Anesthesia Transfer of Care Note ? ?Patient: Troy Schupp [name] ? ?Procedure(s) Performed: ESOPHAGOGASTRODUODENOSCOPY (EGD) (Left) ?POLYPECTOMY ?HOT HEMOSTASIS (ARGON PLASMA COAGULATION/BICAP) ?SCLEROTHERAPY ?HEMOSTASIS CLIP PLACEMENT ? ?Patient Location: PACU ? ?Anesthesia Type:MAC ? ?Level of Consciousness: drowsy and patient cooperative ? ?Airway & Oxygen Therapy: Patient Spontanous Breathing and Patient connected to nasal cannula oxygen ? ?Post-op Assessment: Report given to RN and Post -op Vital signs reviewed and stable ? ?Post vital signs: Reviewed and stable ? ?Last Vitals:  ?Vitals Value Taken Time  ?BP 154/46 02/13/22 1131  ?Temp    ?Pulse 80 02/13/22 1131  ?Resp 21 02/13/22 1132  ?SpO2 100 % 02/13/22 1131  ?Vitals shown include unvalidated device data. ? ?Last Pain:  ?Vitals:  ? 02/13/22 1008  ?TempSrc: Temporal  ?PainSc: 0-No pain  ?   ? ?  ? ?Complications: No notable events documented. ?

## 2022-02-14 ENCOUNTER — Inpatient Hospital Stay (HOSPITAL_COMMUNITY): Payer: PPO

## 2022-02-14 ENCOUNTER — Encounter (HOSPITAL_COMMUNITY): Payer: Self-pay | Admitting: Gastroenterology

## 2022-02-14 DIAGNOSIS — J449 Chronic obstructive pulmonary disease, unspecified: Secondary | ICD-10-CM

## 2022-02-14 DIAGNOSIS — K5710 Diverticulosis of small intestine without perforation or abscess without bleeding: Secondary | ICD-10-CM | POA: Diagnosis not present

## 2022-02-14 DIAGNOSIS — I714 Abdominal aortic aneurysm, without rupture, unspecified: Secondary | ICD-10-CM | POA: Diagnosis not present

## 2022-02-14 DIAGNOSIS — N1832 Chronic kidney disease, stage 3b: Secondary | ICD-10-CM | POA: Diagnosis not present

## 2022-02-14 DIAGNOSIS — K317 Polyp of stomach and duodenum: Secondary | ICD-10-CM | POA: Diagnosis not present

## 2022-02-14 DIAGNOSIS — R195 Other fecal abnormalities: Secondary | ICD-10-CM | POA: Diagnosis not present

## 2022-02-14 DIAGNOSIS — I480 Paroxysmal atrial fibrillation: Secondary | ICD-10-CM | POA: Diagnosis not present

## 2022-02-14 DIAGNOSIS — K269 Duodenal ulcer, unspecified as acute or chronic, without hemorrhage or perforation: Secondary | ICD-10-CM | POA: Diagnosis not present

## 2022-02-14 LAB — CBC
HCT: 28.6 % — ABNORMAL LOW (ref 39.0–52.0)
Hemoglobin: 9.6 g/dL — ABNORMAL LOW (ref 13.0–17.0)
MCH: 29.8 pg (ref 26.0–34.0)
MCHC: 33.6 g/dL (ref 30.0–36.0)
MCV: 88.8 fL (ref 80.0–100.0)
Platelets: 245 10*3/uL (ref 150–400)
RBC: 3.22 MIL/uL — ABNORMAL LOW (ref 4.22–5.81)
RDW: 22.5 % — ABNORMAL HIGH (ref 11.5–15.5)
WBC: 8 10*3/uL (ref 4.0–10.5)
nRBC: 0.4 % — ABNORMAL HIGH (ref 0.0–0.2)

## 2022-02-14 LAB — TYPE AND SCREEN
ABO/RH(D): A POS
Antibody Screen: NEGATIVE
Unit division: 0
Unit division: 0

## 2022-02-14 LAB — RENAL FUNCTION PANEL
Albumin: 3 g/dL — ABNORMAL LOW (ref 3.5–5.0)
Anion gap: 8 (ref 5–15)
BUN: 21 mg/dL (ref 8–23)
CO2: 22 mmol/L (ref 22–32)
Calcium: 8.7 mg/dL — ABNORMAL LOW (ref 8.9–10.3)
Chloride: 109 mmol/L (ref 98–111)
Creatinine, Ser: 1.34 mg/dL — ABNORMAL HIGH (ref 0.61–1.24)
GFR, Estimated: 53 mL/min — ABNORMAL LOW (ref >60)
Glucose, Bld: 137 mg/dL — ABNORMAL HIGH (ref 70–99)
Phosphorus: 3.9 mg/dL (ref 2.5–4.6)
Potassium: 4.4 mmol/L (ref 3.5–5.1)
Sodium: 139 mmol/L (ref 135–145)

## 2022-02-14 LAB — BPAM RBC
Blood Product Expiration Date: 202306072359
Blood Product Expiration Date: 202306072359
ISSUE DATE / TIME: 202305161641
ISSUE DATE / TIME: 202305162210
Unit Type and Rh: 6200
Unit Type and Rh: 6200

## 2022-02-14 LAB — MAGNESIUM: Magnesium: 2.2 mg/dL (ref 1.7–2.4)

## 2022-02-14 LAB — SURGICAL PATHOLOGY

## 2022-02-14 MED ORDER — PANTOPRAZOLE SODIUM 40 MG PO TBEC
40.0000 mg | DELAYED_RELEASE_TABLET | Freq: Two times a day (BID) | ORAL | Status: DC
  Administered 2022-02-14 – 2022-02-16 (×4): 40 mg via ORAL
  Filled 2022-02-14 (×4): qty 1

## 2022-02-14 MED ORDER — FUROSEMIDE 10 MG/ML IJ SOLN
20.0000 mg | Freq: Once | INTRAMUSCULAR | Status: AC
  Administered 2022-02-14: 20 mg via INTRAVENOUS
  Filled 2022-02-14: qty 2

## 2022-02-14 MED ORDER — FUROSEMIDE 40 MG PO TABS
60.0000 mg | ORAL_TABLET | Freq: Two times a day (BID) | ORAL | Status: DC
  Administered 2022-02-14 – 2022-02-16 (×4): 60 mg via ORAL
  Filled 2022-02-14 (×4): qty 1

## 2022-02-14 NOTE — Progress Notes (Addendum)
? ? Attending physician's note  ? ?I have taken a history, reviewed the chart, and examined the patient. I performed a substantive portion of this encounter, including complete performance of at least one of the key components, in conjunction with the APP. I agree with the APP's note, impression, and recommendations with my edits.  ? ?Transfused 2 unit PRBCs yesterday for hemoglobin 6.7.  Posttransfusion H/H 9.6/20.6.  He states he feels much better today.  No e/o overt bleeding overnight.  Tolerating p.o. intake and he is very hopeful for discharge today. ? ?BUN downtrending, now 21. ? ?Pathology returned from yesterday's upper endoscopy.  Polyp that was resected was a benign hyperplastic polyp without metaplasia, dysplasia. ? ?- Continue high-dose PPI x8 weeks, then reduce Protonix to 40 mg/day ?- Continue holding Eliquis for at least 1 more day ?- Can slowly advance diet as tolerated ?- Check CBC in 7-10 days after discharge.  Will arrange for this to be done in our outpatient lab ?- Will arrange for follow-up in the GI clinic ?- Abdominal x-ray as outpatient next week to ensure pancreatic stent appropriately migrated.  This has already been ordered as outpatient ?- GI service will sign off at this time.  Please do not hesitate to contact us with additional questions or concerns ? ?[address], DO, FACG ?([phone] office  ? ?   ? ?        ? ?Daily Rounding Note ? ?02/14/2022, 11:31 AM ? LOS: 1 day  ? ?SUBJECTIVE:   ?Chief complaint:   upper GI bleed  ? ?Feeling well.  No black stools.  Tolerating full liquids.  Feels well and begging to be able to discharge home. ? ?OBJECTIVE:        ? Vital signs in last 24 hours:    ?Temp:  [97.8 ?F (36.6 ?C)-98.5 ?F (36.9 ?C)] 98.5 ?F (36.9 ?C) (05/17 0739) ?Pulse Rate:  [59-71] 63 (05/17 0739) ?Resp:  [13-20] 13 (05/17 0508) ?BP: (128-155)/(47-97) 155/68 (05/17 0739) ?SpO2:  [95 %-100 %] 95 % (05/17 0739) ?Last BM  Date : 02/11/22 ?Filed Weights  ? 02/12/22 1315  ?Weight: 87.5 kg  ? ?General: Looks well. ?Heart: NSR in 60s on tele ?Chest: No labored breathing ?Abdomen: Soft, not tender, not distended. ? ?Intake/Output from previous day: ?05/16 0701 - 05/17 0700 ?In: 939.2 [P.O.:300; I.V.:324.2; Blood:315] ?Out: 200 [Urine:200] ? ?Intake/Output this shift: ?Total I/O ?In: 240 [P.O.:240] ?Out: 300 [Urine:300] ? ?Lab Results: ?Recent Labs  ?  02/13/22 ?0236 02/13/22 ?1248 02/13/22 ?8657 02/14/22 ?0512  ?WBC 6.9  --  6.9 8.0  ?HGB 7.5* 7.4* 6.7* 9.6*  ?HCT 22.6* 22.6* 21.4* 28.6*  ?PLT 214  --  227 245  ? ?BMET ?Recent Labs  ?  02/12/22 ?1320 02/13/22 ?0236 02/14/22 ?0512  ?NA 137 137 139  ?K 4.4 3.7 4.4  ?CL 105 106 109  ?CO2 20* 22 22  ?GLUCOSE 216* 129* 137*  ?BUN 46* 36* 21  ?CREATININE 1.83* 1.44* 1.34*  ?CALCIUM 8.6* 8.4* 8.7*  ? ?LFT ?Recent Labs  ?  02/12/22 ?1320 02/14/22 ?0512  ?PROT 5.5*  --   ?ALBUMIN 3.1* 3.0*  ?AST 28  --   ?ALT 19  --   ?ALKPHOS 84  --   ?BILITOT 1.0  --   ? ?PT/INR ?No results for input(s): LABPROT, INR in the last 72 hours. ?Hepatitis Panel ?No results for input(s): HEPBSAG, HCVAB, HEPAIGM, HEPBIGM in the last 72 hours. ? ?Studies/Results: ?CT Head Wo Contrast ? ?Result  Date: 02/12/2022 ?CLINICAL DATA:  Fall, head and neck trauma EXAM: CT HEAD WITHOUT CONTRAST CT CERVICAL SPINE WITHOUT CONTRAST TECHNIQUE: Multidetector CT imaging of the head and cervical spine was performed following the standard protocol without intravenous contrast. Multiplanar CT image reconstructions of the cervical spine were also generated. RADIATION DOSE REDUCTION: This exam was performed according to the departmental dose-optimization program which includes automated exposure control, adjustment of the mA and/or kV according to patient size and/or use of iterative reconstruction technique. COMPARISON:  None Available. CT head dated June 23, 2018 FINDINGS: CT HEAD FINDINGS Brain: No evidence of acute infarction,  hemorrhage, hydrocephalus, extra-axial collection or mass lesion/mass effect. Mild cerebral atrophy and chronic microvascular ischemic changes of the white matter. Vascular: No hyperdense vessel or unexpected calcification. Skull: Normal. Negative for fracture or focal lesion. Sinuses/Orbits: No acute finding.  Bilateral cataract surgery. Other: None. CT CERVICAL SPINE FINDINGS Alignment: Straightening of the cervical spine. Skull base and vertebrae: No acute fracture. No primary bone lesion or focal pathologic process. Soft tissues and spinal canal: No prevertebral fluid or swelling. No visible canal hematoma. Disc levels: Multilevel degenerate disc disease with disc space loss and osteophytes prominent at C4-C5, C5-C6 and C6-C7. Mild moderate multilevel uncovertebral joint and facet joint arthropathy. Moderate left neural foraminal stenosis at C4-C5 and moderate neural foraminal stenosis at C5-C6. Upper chest: No acute abnormality. Other: None IMPRESSION: 1. No acute intracranial abnormality. 2. Mild cerebral atrophy and chronic microvascular ischemic changes of the white matter. 3. No acute cervical spine fracture or traumatic subluxation. 4. Moderate multilevel degenerate disc disease prominent at C4-C5 and C5-C6. Uncovertebral joint and facet joint arthropathy with neural foraminal stenosis prominent at C5-C6. Electronically Signed   By: Keane Police D.O.   On: 02/12/2022 13:55  ? ?CT Cervical Spine Wo Contrast ? ?Result Date: 02/12/2022 ?CLINICAL DATA:  Fall, head and neck trauma EXAM: CT HEAD WITHOUT CONTRAST CT CERVICAL SPINE WITHOUT CONTRAST TECHNIQUE: Multidetector CT imaging of the head and cervical spine was performed following the standard protocol without intravenous contrast. Multiplanar CT image reconstructions of the cervical spine were also generated. RADIATION DOSE REDUCTION: This exam was performed according to the departmental dose-optimization program which includes automated exposure control,  adjustment of the mA and/or kV according to patient size and/or use of iterative reconstruction technique. COMPARISON:  None Available. CT head dated June 23, 2018 FINDINGS: CT HEAD FINDINGS Brain: No evidence of acute infarction, hemorrhage, hydrocephalus, extra-axial collection or mass lesion/mass effect. Mild cerebral atrophy and chronic microvascular ischemic changes of the white matter. Vascular: No hyperdense vessel or unexpected calcification. Skull: Normal. Negative for fracture or focal lesion. Sinuses/Orbits: No acute finding.  Bilateral cataract surgery. Other: None. CT CERVICAL SPINE FINDINGS Alignment: Straightening of the cervical spine. Skull base and vertebrae: No acute fracture. No primary bone lesion or focal pathologic process. Soft tissues and spinal canal: No prevertebral fluid or swelling. No visible canal hematoma. Disc levels: Multilevel degenerate disc disease with disc space loss and osteophytes prominent at C4-C5, C5-C6 and C6-C7. Mild moderate multilevel uncovertebral joint and facet joint arthropathy. Moderate left neural foraminal stenosis at C4-C5 and moderate neural foraminal stenosis at C5-C6. Upper chest: No acute abnormality. Other: None IMPRESSION: 1. No acute intracranial abnormality. 2. Mild cerebral atrophy and chronic microvascular ischemic changes of the white matter. 3. No acute cervical spine fracture or traumatic subluxation. 4. Moderate multilevel degenerate disc disease prominent at C4-C5 and C5-C6. Uncovertebral joint and facet joint arthropathy with neural foraminal stenosis prominent at  C5-C6. Electronically Signed   By: Keane Police D.O.   On: 02/12/2022 13:55  ? ?DG Chest Portable 1 View ? ?Result Date: 02/12/2022 ?CLINICAL DATA:  Syncope, fall, initial encounter. EXAM: PORTABLE CHEST 1 VIEW COMPARISON:  CT chest 09/12/2021 and chest radiograph 10/24/2021. FINDINGS: Trachea is midline. Heart size stable. Loop recorder projects over the left heart. Moderate right  pleural effusion appears partially loculated. Haziness in the right lung base. Findings appears similar to comparison exams. Left lung is clear. IMPRESSION: 1. Pleuroparenchymal scarring in the lower right he

## 2022-02-14 NOTE — Evaluation (Signed)
Physical Therapy Evaluation ?Patient Details ?Name: Troy Yonkers [name] ?MRN: [id number] ?DOB: March 24, 1941 ?Today's Date: 02/14/2022 ? ?History of Present Illness ? Pt is an 81 y/o male admitted secondary to fall, SOB, and GI bleeding. Pt is s/p EGD and found bleeding gastric polyp. PMH includes colon/lung cacner, DM, COPD, glaucoma, a fib, AAA, and dCHF.  ?Clinical Impression ? Pt admitted secondary to problem above with deficits below. Pt very unsteady and with increased SOB with basic mobility tasks. Very shaky and requiring mod A throughout; LOB X2-3 with short distance ambulation. Sats decreasing to 84% on RA and requiring 3L to return to 94%. RN notified. Had lengthy discussion with pt with support from patients son about safety concerns and increased risk for falls. Strongly recommend SNF level therapies at d/c to address current mobility deficits. Will continue to follow acutely.    ?   ? ?Recommendations for follow up therapy are one component of a multi-disciplinary discharge planning process, led by the attending physician.  Recommendations may be updated based on patient status, additional functional criteria and insurance authorization. ? ?Follow Up Recommendations Skilled nursing-short term rehab (<3 hours/day) ? ?  ?Assistance Recommended at Discharge Frequent or constant Supervision/Assistance  ?Patient can return home with the following ? A lot of help with walking and/or transfers;A lot of help with bathing/dressing/bathroom;Assistance with cooking/housework;Direct supervision/assist for medications management;Direct supervision/assist for financial management;Assist for transportation;Help with stairs or ramp for entrance ? ?  ?Equipment Recommendations Wheelchair (measurements PT);Wheelchair cushion (measurements PT)  ?Recommendations for Other Services ?    ?  ?Functional Status Assessment Patient has had a recent decline in their functional status and demonstrates the ability to make  significant improvements in function in a reasonable and predictable amount of time.  ? ?  ?Precautions / Restrictions Precautions ?Precautions: Fall ?Restrictions ?Weight Bearing Restrictions: No  ? ?  ? ?Mobility ? Bed Mobility ?Overal bed mobility: Needs Assistance ?Bed Mobility: Supine to Sit, Sit to Supine ?  ?  ?Supine to sit: Mod assist ?Sit to supine: Min assist ?  ?General bed mobility comments: Mod A For trunk elevation and to scoot hips to EOB. Min A For LE assist for return to supine. ?  ? ?Transfers ?Overall transfer level: Needs assistance ?Equipment used: Rolling walker (2 wheels) ?Transfers: Sit to/from Stand ?Sit to Stand: Mod assist ?  ?  ?  ?  ?  ?General transfer comment: Very unsteady upon standing. Pt with increased SOB and required seated rest in between pulling LE dressing up and attempting mobility. ?  ? ?Ambulation/Gait ?Ambulation/Gait assistance: Mod assist ?Gait Distance (Feet): 3 Feet ?Assistive device: Rolling walker (2 wheels) ?Gait Pattern/deviations: Step-through pattern, Decreased stride length ?Gait velocity: Decreased ?  ?  ?General Gait Details: Extremely unsteady. LOB X2-3 while taking steps at EOB. Increased SOB and sats down to 84% on RA. Required 3L to return to 94%. Unable to tolerate further mobility. ? ?Stairs ?  ?  ?  ?  ?  ? ?Wheelchair Mobility ?  ? ?Modified Rankin (Stroke Patients Only) ?  ? ?  ? ?Balance Overall balance assessment: Needs assistance ?Sitting-balance support: No upper extremity supported, Feet supported ?Sitting balance-Leahy Scale: Fair ?  ?  ?Standing balance support: Bilateral upper extremity supported ?Standing balance-Leahy Scale: Poor ?Standing balance comment: Reliant on BUE support ?  ?  ?  ?  ?  ?  ?  ?  ?  ?  ?  ?   ? ? ? ?Pertinent Vitals/Pain  Pain Assessment ?Pain Assessment: No/denies pain  ? ? ?Home Living Family/patient expects to be discharged to:: Private residence ?Living Arrangements: Other (Comment) (caregiver) ?Available Help at  Discharge: Personal care attendant ?Type of Home: House ?Home Access: Stairs to enter ?Entrance Stairs-Rails: Right ?Entrance Stairs-Number of Steps: 1+1 ?  ?Home Layout: One level ?Home Equipment: Grab bars - tub/shower;Shower seat ?Additional Comments: Caregivers 5-7 days/week, 9-2 on sat, and sun; 9-2 and 4-8 M-F  ?  ?Prior Function Prior Level of Function : Needs assist ?  ?  ?  ?  ?  ?  ?Mobility Comments: Has rollator that he uses occasionally. Reports he normally is independent. Son has concerns about mobility ?ADLs Comments: Caregiver assists with meds, assists with cooking, driving, grocery shopping, laundry. Assists with picking clothes out, but pt able to dress himself. ?  ? ? ?Hand Dominance  ?   ? ?  ?Extremity/Trunk Assessment  ? Upper Extremity Assessment ?Upper Extremity Assessment: Defer to OT evaluation ?  ? ?Lower Extremity Assessment ?Lower Extremity Assessment: Generalized weakness ?  ? ?Cervical / Trunk Assessment ?Cervical / Trunk Assessment: Kyphotic  ?Communication  ? Communication: No difficulties  ?Cognition Arousal/Alertness: Awake/alert ?Behavior During Therapy: [facility] for tasks assessed/performed ?Overall Cognitive Status: Impaired/Different from baseline ?Area of Impairment: Safety/judgement ?  ?  ?  ?  ?  ?  ?  ?  ?  ?  ?  ?  ?Safety/Judgement: Decreased awareness of deficits, Decreased awareness of safety ?  ?  ?General Comments: Pt with very poor safety awareness and awareness of deficits. Extremely unsafe with mobility tasks ?  ?  ? ?  ?General Comments General comments (skin integrity, edema, etc.): Had very lengthy discussion with pt with support from son about current safety concerns and risks associated with returning home. ? ?  ?Exercises    ? ?Assessment/Plan  ?  ?PT Assessment Patient needs continued PT services  ?PT Problem List Decreased strength;Decreased activity tolerance;Decreased balance;Decreased mobility;Decreased cognition;Decreased knowledge of use of DME;Decreased  safety awareness;Decreased knowledge of precautions;Cardiopulmonary status limiting activity ? ?   ?  ?PT Treatment Interventions DME instruction;Gait training;Therapeutic activities;Therapeutic exercise;Functional mobility training;Balance training;Patient/family education   ? ?PT Goals (Current goals can be found in the Care Plan section)  ?Acute Rehab PT Goals ?Patient Stated Goal: to go home ?PT Goal Formulation: With patient ?Time For Goal Achievement: 02/28/22 ?Potential to Achieve Goals: Good ? ?  ?Frequency Min 2X/week ?  ? ? ?Co-evaluation   ?  ?  ?  ?  ? ? ?  ?AM-PAC PT "6 Clicks" Mobility  ?Outcome Measure Help needed turning from your back to your side while in a flat bed without using bedrails?: A Little ?Help needed moving from lying on your back to sitting on the side of a flat bed without using bedrails?: A Lot ?Help needed moving to and from a bed to a chair (including a wheelchair)?: A Lot ?Help needed standing up from a chair using your arms (e.g., wheelchair or bedside chair)?: A Lot ?Help needed to walk in hospital room?: Total ?Help needed climbing 3-5 steps with a railing? : Total ?6 Click Score: 11 ? ?  ?End of Session Equipment Utilized During Treatment: Gait belt ?Activity Tolerance: Patient limited by fatigue ?Patient left: in bed;with bed alarm set;with call bell/phone within reach ?Nurse Communication: Mobility status ?PT Visit Diagnosis: Unsteadiness on feet (R26.81);Muscle weakness (generalized) (M62.81);Difficulty in walking, not elsewhere classified (R26.2) ?  ? ?Time: 9381-8299 ?PT Time Calculation (min) (ACUTE  ONLY): 47 min ? ? ?Charges:   PT Evaluation ?$PT Eval Moderate Complexity: 1 Mod ?PT Treatments ?$Therapeutic Activity: 23-37 mins ?  ?   ? ? ?Reuel Derby, PT, DPT  ?Acute Rehabilitation Services  ?Pager: [phone] ?Office: [phone] ? ? ?[place] ?02/14/2022, 2:34 PM ? ?

## 2022-02-14 NOTE — Progress Notes (Signed)
?PROGRESS NOTE ? ? ? ?Troy Portilla [name]  LOV:[id number] DOB: [birth year] DOA: 02/12/2022 ?PCP: Binnie Rail, MD  ? ?Brief Narrative:  ?No notes on file  ? ? ?Assessment and Plan: ?No notes have been filed under this hospital service. ?Service: Hospitalist ? ? ?Upper GI bleed/acute blood loss anemia/nonbleeding duodenal ulcer with visible vessel, duodenal diverticulum, multiple gastric polyps ?-Patient had presented with near syncopal episode, noted to be anemic, positive FOBT, concern for melanotic stools, hemoglobin noted at 8.2 from 13.4 on 01/25/2022. ?-Patient noted to have had a recent ERCP done and initial concern was for traumatic injury from ERCP with mild bleeding in the setting of anticoagulation with Eliquis. ?-Patient seen in consultation by GI, patient placed on IV PPI twice daily, placed on IV fluids, made n.p.o. and subsequently underwent upper endoscopy earlier today 02/13/2022 which showed multiple gastric polyps 1 actively oozing status post resection, nonbleeding duodenal ulcer with visible vessel noted status post epinephrine and cautery, nonbleeding duodenal diverticulum. ?-Hemoglobin trickled down as low as 6.7. ?-Anemia panel consistent with anemia of chronic disease as it showed an iron level of 62, U IBC of 260, TIBC of 322, saturation ratios of 19%, ferritin level of 85, folate level of 12.2 and a vitamin B12 level of 166 ?-Patient's hemoglobin/hematocrit is now improved to 9.6/20.6 ?-2 units packed red blood cells ordered and transfused on 02/13/2022 ?-Continue IV PPI twice daily and transition to p.o. twice daily for 8 weeks and then 40 mg daily. ?-Continue to hold Eliquis and could likely resume in a.m. per GI ?-Follow H&H. ?-Transfusion threshold hemoglobin < 8. ?-PT OT evaluated and recommending SNF ? ?Vitamin B12 Deficiency ?-Vitamin B12 levels at 166. ?-Vitamin B12 1000 mcg subcu daily during the hospitalization and can transition to oral vitamin B12 supplementation on  discharge. ? ?Paroxysmal atrial fibrillation ?-Continue Metoprolol Succinate 12.5 mg p.o. nightly for rate control. ?-Hold Eliquis secondary to Above ?-Defer resumption of anticoagulation to Gastroenterology and they are recommending resuming on 02/15/2022 ? ?Fall with generalized deconditioning ?-Patient with scattered abrasions and bruises. ?-Status post laceration repair to forehead. ?-CT head CT C-spine negative for any acute abnormalities. ?-PT OT evaluated and recommending SNF given his unsteadiness ? ?Hypertension ?-Blood pressure noted to be soft on admission. ?-Continued to hold home regimen Amlodipine and Cozaar. ?-Continue Metoprolol Succinate 12.5 mg p.o. nightly ? ?Hyperlipidemia ?-Continue Rosuvastatin 4 mg p.o., Vascepa 2 g p.o. twice daily. ? ?Diabetes Mellitus Type 2 ?-Hemoglobin A1c 6.7 (01/10/2022). ?-Continue to Hold home regimen Jardiance ?-Was never initiated on a sensitive NovoLog sliding scale ?-CBGs ranging from 129-216 on daily BMP/CMP ? ?Glaucoma ?-Patient with advanced visual impairment ?-Continue Latanoprost, Simbrinza. ? ?AAA ?-Outpatient follow-up. ?-Continue beta-blocker as above ? ?Chronic diastolic CHF ?-Resume home p.o. Lasix regimen today ?-Was on IV fluids which we will discontinue as patient being transfused 2 units packed red blood cells. ?-IV Lasix in between transfusion units given yesterday and given another dose of IV Lasix 20 mg today ?-Continue beta-blocker. ?-Strict I's and O's and daily weights ?-Patient has +1.316 L since admission ?-Continue to monitor for signs and symptoms of volume overload ? ?Mood Disorder ?-Continue with escitalopram 10 mg p.o. daily ? ?CKD stage IIIb ?-Stable.  Patient's BUNs/creatinine is now 21/1.34 and trended down from 46/1.83 on admission ?-Avoid further nephrotoxic medications, contrast dyes, hypotension and dehydration to ensure adequate renal perfusion and renally dose medications ?-Repeat CMP in a.m. ? ?Acute respiratory failure with  hypoxia in the setting of COPD ?-Patient desaturated on  ambulatory home O2 screen and required 3 L to bring his O2 saturations greater than 92% ?-Patient was noted to be wheezing on examination today ?-Continue with albuterol nebs 3 mL every 6 as needed for wheezing or shortness of breath as well as Brovana 15 mcg nebs twice daily associated with Incruse Ellipta 1 puff IH daily ?-Continue with budesonide 0.25 mg nebs twice daily ?-SpO2: 95 % ?O2 Flow Rate (L/min): 2 L/min ?-We will add guaifenesin 1200 mg p.o. twice daily, flutter valve and incentive spirometry ?-Given an extra dose of IV Lasix this morning given his wheezing ?-Check chest x-ray given his hypoxia ?-Repeat ambulatory home O2 screen in the morning ? ?DVT prophylaxis: Place and maintain sequential compression device Start: 02/13/22 1823 ?SCDs Start: 02/12/22 1638 ? ?  Code Status: DNR ?Family Communication: No family currently at bedside ? ?Disposition Plan:  ?Level of care: Telemetry Medical ?Status is: Inpatient ?Remains inpatient appropriate because: Needs GI clearance and now PT OT recommending SNF for safe discharge disposition given his extreme generalized weakness ?  ?Consultants:  ?Gastroenterology ? ?Procedures:  ?EGD ?Findings: ?     The examined esophagus was normal. ?     Multiple small sessile polyps were found in the gastric fundus and in  ?     the gastric body. One 4 mm polyp had active oozing. While the more  ?     likely culprit bleeding lesion was the sphincterotomy site ulcer below,  ?     given the active oozing, anemia, and need to eventually restart  ?     anticoagulation, the polyp was removed with a cold snare then treated  ?     with a single hemostatic clip with closure and cessation of bleeding.  ?     Resection and retrieval were complete. There was no bleeding at the end  ?     of the procedure. ?     Mild inflammation characterized by congestion (edema) and erythema was  ?     found in the gastric fundus and in the  gastric body. This was previously  ?     biopsied and benign. ?     One non-bleeding duodenal ulcer with a visible vessel was found at the  ?     major papilla. The lesion was 3 mm in largest dimension. Area was  ?     successfully injected with 3 mL of a 1:10,000 solution of epinephrine  ?     for hemostasis with appropriate mucosal blanching. Coagulation for  ?     hemostasis using bicap heater probe was successful. ?     A non-bleeding diverticulum was found in the second portion of the  ?     duodenum. This was lavaged and had a clean base without stigmata of  ?     bleeding. A pancreatic stent was noted in position. The remainder of the  ?     visualized duodenum was normal appearing. ?Impression:               - Normal esophagus. ?                          - Multiple gastric polyps. One was actively oozing  ?                          upon endoscope insertion. Resected with a cold  ?  snare then clip closed with one hemostatic clip. ?                          - Gastritis. ?                          - Non-bleeding duodenal ulcer with a visible vessel  ?                          located at the major papilla. Injected with  ?                          epinephrine then treated with a bicap heater probe. ?                          - Non-bleeding duodenal diverticulum. ?Recommendation:           - Return patient to hospital ward for ongoing care. ?                          - Full liquid diet today, and if no sign of  ?                          rebleeding, can advance diet as tolerated tomorrow. ?                          - Continue serial Hgb/Hct checks. ?                          - Continue holding Eliquis today. Will engage with  ?                          the Hospitalist service regarding prolonged hold to  ?                          allow continued mucosal healing. ?                          - Continue other medications. ?                          - Await pathology results. ?                           - Based on appearance and positioning, suspect the  ?                          pancreatic stent will appropriately fall out in the  ?                          next couple of days. Will obtain KUB prior to  ?

## 2022-02-14 NOTE — TOC Initial Note (Signed)
Transition of Care (TOC) - Initial/Assessment Note  ? ? ?Patient Details  ?Name: Troy Schowalter [name] ?MRN: [id number] ?Date of Birth: 05-19-1941 ? ?Transition of Care [facility]) CM/SW Contact:    ?Joanne Chars, LCSW ?Phone Number: ?02/14/2022, 4:04 PM ? ?Clinical Narrative:   CSW spoke with pt and son Kara Pacer regarding DC recommendation for SNF.  Permission given to speak with son and also with daughter [name]. Pt agitated, somewhat angry, but was able to discuss SNF option.  Pt currently lives home alone with [facility] aide support 8 hours per day.  Pt children have been trying to convince pt to go to ALF for some time and pt has refused.   Pt had questions about SNF but did agree for CSW to fax out referral to see what options there are.  Choice document given.  Pt is vaccinated for covid with 2 boosters.             ? ? ?Expected Discharge Plan: [place] ?Barriers to Discharge: Continued Medical Work up, SNF Pending bed offer ? ? ?Patient Goals and CMS Choice ?Patient states their goals for this hospitalization and ongoing recovery are:: get back home ?CMS Medicare.gov Compare Post Acute Care list provided to:: Patient Represenative (must comment) ?Choice offered to / list presented to : Adult Children (son) ? ?Expected Discharge Plan and Services ?Expected Discharge Plan: [place] ?In-house Referral: Clinical Social Work ?  ?Post Acute Care Choice: [place] ?Living arrangements for the past 2 months: [place] ?                ?  ?  ?  ?  ?  ?  ?  ?  ?  ?  ? ?Prior Living Arrangements/Services ?Living arrangements for the past 2 months: [place] ?Lives with:: Self ?Patient language and need for interpreter reviewed:: Yes ?Do you feel safe going back to the place where you live?: Yes      ?Need for Family Participation in Patient Care: Yes (Comment) ?Care giver support system in place?: Yes (comment) ?Current home services: Homehealth aide ?Criminal  Activity/Legal Involvement Pertinent to Current Situation/Hospitalization: No - Comment as needed ? ?Activities of Daily Living ?Home Assistive Devices/Equipment: None ?ADL Screening (condition at time of admission) ?Patient's cognitive ability adequate to safely complete daily activities?: Yes ?Is the patient deaf or have difficulty hearing?: No ?Does the patient have difficulty seeing, even when wearing glasses/contacts?: Yes ?Does the patient have difficulty concentrating, remembering, or making decisions?: No ?Patient able to express need for assistance with ADLs?: Yes ?Does the patient have difficulty dressing or bathing?: Yes ?Independently performs ADLs?: No ?Communication: Independent ?Dressing (OT): Independent ?Grooming: Independent ?Feeding: Independent ?Bathing: Needs assistance ?Is this a change from baseline?: Pre-admission baseline ?Toileting: Independent ?In/Out Bed: Independent ?Walks in Home: Independent ?Does the patient have difficulty walking or climbing stairs?: No ?Weakness of Legs: None ?Weakness of Arms/Hands: None ? ?Permission Sought/Granted ?Permission sought to share information with : Family Supports ?Permission granted to share information with : Yes, Verbal Permission Granted ? Share Information with NAME: son Marden Noble, daughter [name] ? Permission granted to share info w AGENCY: SNF ?   ?   ? ?Emotional Assessment ?Appearance:: Appears stated age ?Attitude/Demeanor/Rapport: Angry ?Affect (typically observed): Appropriate ?Orientation: : Oriented to Self, Oriented to Place, Oriented to  Time, Oriented to Situation ?Alcohol / Substance Use: Not Applicable ?Psych Involvement: No (comment) ? ?Admission diagnosis:  Laceration of forehead, initial encounter [S01.81XA] ?  Occult GI bleeding [R19.5] ?Symptomatic anemia [D64.9] ?Patient Active Problem List  ? Diagnosis Date Noted  ? Vitamin B12 deficiency 02/13/2022  ? Gastric polyps   ? Duodenal diverticulum   ? Duodenal ulcer   ? Occult GI  bleeding 02/12/2022  ? Fall at home, initial encounter 02/12/2022  ? Laceration of forehead   ? Melena   ? Heme positive stool   ? History of thyrotoxicosis 01/10/2022  ? Acute respiratory failure with hypoxia ([place]) 07/02/2021  ? SOB (shortness of breath) 07/02/2021  ? Hypomagnesemia 07/02/2021  ? COPD with acute exacerbation ([place]) 07/02/2021  ? Hypokalemia 07/02/2021  ? Acute hypoxemic respiratory failure due to COVID-19 [facility]) 07/02/2021  ? Hyperlipidemia   ? COVID-19 virus infection 07/01/2021  ? Chronic anticoagulation 10/18/2020  ? Hx of adenomatous colonic polyps 10/18/2020  ? History of gastric polyps 10/18/2020  ? Iron deficiency 10/18/2020  ? History of anemia 10/18/2020  ? Gastric intestinal metaplasia 10/18/2020  ? Aortic atherosclerosis ([place]) 08/01/2020  ? Secondary hypercoagulable state ([place]) 03/22/2020  ? Personal history of colon cancer 01/08/2020  ? Epigastric pain 01/08/2020  ? Abnormal findings on esophagogastroduodenoscopy (EGD) 01/08/2020  ? Calculus of gallbladder without cholecystitis without obstruction 01/08/2020  ? Vertigo 11/25/2019  ? Cancer of sigmoid colon ([place]) 12/12/2018  ? Pleural effusion on right 07/15/2018  ? Colon adenocarcinoma ([place]) 06/30/2018  ? Acute GI bleeding 06/23/2018  ? Acute blood loss anemia 06/23/2018  ? Depression 05/06/2018  ? Syncope 01/29/2018  ? Chronic diastolic CHF (congestive heart failure) ([place]) 05/04/2017  ? CAD (coronary artery disease) 05/03/2017  ? MGUS (monoclonal gammopathy of unknown significance) 03/07/2016  ? Benign essential HTN 01/19/2016  ? OSA (obstructive sleep apnea) 08/29/2015  ? PAF (paroxysmal atrial fibrillation) ([place])   ? Glaucoma 06/22/2015  ? DOE (dyspnea on exertion) 02/04/2015  ? Memory loss, short term 08/03/2014  ? Symptomatic anemia 12/28/2013  ? Occlusion and stenosis of carotid artery without mention of cerebral infarction 07/09/2013  ? COPD GOLD II  07/09/2013  ? Chronic renal disease, stage 3, moderately decreased glomerular  filtration rate between 30-59 mL/min/1.73 square meter ([place]) 07/06/2013  ? TIA (transient ischemic attack) 07/06/2013  ? Basal cell carcinoma of skin 10/10/2012  ? History of lung cancer 03/11/2012  ? PAD (peripheral artery disease) ([place]) 12/16/2011  ? AAA (abdominal aortic aneurysm) ([place]) 09/27/2011  ? Squamous cell lung cancer ([place]) 11/08/2010  ? Allergic rhinitis 07/28/2010  ? GAIT IMBALANCE 03/02/2009  ? Diabetes mellitus without complication ([place]) 53/66/4403  ? HYPERPLASIA PROSTATE UNS W/O UR OBST & OTH LUTS 01/15/2008  ? Peripheral vascular disease ([place]) 09/29/2007  ? LEUKOPLAKIA, VOCAL CORDS 09/29/2007  ? FEMORAL BRUIT 09/29/2007  ? ?PCP:  Binnie Rail, MD ?Pharmacy:   ?[facility], [place] [address] ?Phone: [phone] Fax: [phone] ? ?[facility] ?[address] ?[place] [address] ?Phone: [phone] Fax: [phone] ? ? ? ? ?Social Determinants of Health (SDOH) Interventions ?  ? ?Readmission Risk Interventions ? ?  07/04/2021  ?  3:08 PM 07/04/2021  ?  1:14 PM  ?Readmission Risk Prevention Plan  ?Transportation Screening Complete Complete  ?Medication Review (RN Care Manager) Referral to Pharmacy Referral to Pharmacy  ?PCP or Specialist appointment within 3-5 days of discharge Complete Complete  ?[place] or Home Care Consult Complete Complete  ?SW Recovery Care/Counseling Consult Complete   ?Palliative Care Screening Not Applicable   ?  [place] Patient Refused   ? ? ? ?

## 2022-02-14 NOTE — Plan of Care (Signed)
  Problem: Education: Goal: Knowledge of General Education information will improve Description Including pain rating scale, medication(s)/side effects and non-pharmacologic comfort measures Outcome: Progressing   Problem: Health Behavior/Discharge Planning: Goal: Ability to manage health-related needs will improve Outcome: Progressing   

## 2022-02-14 NOTE — TOC CAGE-AID Note (Signed)
Transition of Care (TOC) - CAGE-AID Screening ? ? ?Patient Details  ?Name: Troy Traweek [name] ?MRN: [id number] ?Date of Birth: 12/14/1940 ? ?Transition of Care (TOC) CM/SW Contact:    ?Shiana Rappleye C Tarpley-Carter, LCSWA ?Phone Number: ?02/14/2022, 12:54 PM ? ? ?Clinical Narrative: ?Pt participated in [place].  Pt stated he does not use substance or ETOH.  Pt was not offered resources, due to no usage of substance or ETOH.    ? ?Passenger transport manager, MSW, LCSW-A ?Pronouns:  She/Her/Hers ?[facility] ?Clinical Social Worker ?Direct Number:  [phone] ?[email or web address] ? ?CAGE-AID Screening: ?  ? ?Have You Ever Felt You Ought to Cut Down on Your Drinking or Drug Use?: No ?Have People Annoyed You By Critizing Your Drinking Or Drug Use?: No ?Have You Felt Bad Or Guilty About Your Drinking Or Drug Use?: No ?Have You Ever Had a Drink or Used Drugs First Thing In The Morning to Steady Your Nerves or to Get Rid of a Hangover?: No ?CAGE-AID Score: 0 ? ?Substance Abuse Education Offered: No ? ?  ? ? ? ? ? ? ?

## 2022-02-14 NOTE — [place] FL2 (Signed)
? MEDICAID FL2 LEVEL OF CARE SCREENING TOOL  ?  ? ?IDENTIFICATION  ?Patient Name: ?Troy Lehrke [name] Birthdate: 03/13/1941 Sex: male Admission Date (Current Location): ?02/12/2022  ?[place] and [place] Number: ? [place] ?  Facility and Address:  ?[facility], [place] [address] [place], [place] [address] ?     Provider Number: ?[id number]  ?Attending Physician Name and Address:  ?Raiford Noble [place], DO ? Relative Name and Phone Number:  ?Bill [place] Daughter [phone] ?   ?Current Level of Care: ?Hospital Recommended Level of Care: ?[place] Prior Approval Number: ?  ? ?Date Approved/Denied: ?  PASRR Number: ?[id number] A ? ?Discharge Plan: ?SNF ?  ? ?Current Diagnoses: ?Patient Active Problem List  ? Diagnosis Date Noted  ? Vitamin B12 deficiency 02/13/2022  ? Gastric polyps   ? Duodenal diverticulum   ? Duodenal ulcer   ? Occult GI bleeding 02/12/2022  ? Fall at home, initial encounter 02/12/2022  ? Laceration of forehead   ? Melena   ? Heme positive stool   ? History of thyrotoxicosis 01/10/2022  ? Acute respiratory failure with hypoxia ([place]) 07/02/2021  ? SOB (shortness of breath) 07/02/2021  ? Hypomagnesemia 07/02/2021  ? COPD with acute exacerbation ([place]) 07/02/2021  ? Hypokalemia 07/02/2021  ? Acute hypoxemic respiratory failure due to COVID-19 [facility]) 07/02/2021  ? Hyperlipidemia   ? COVID-19 virus infection 07/01/2021  ? Chronic anticoagulation 10/18/2020  ? Hx of adenomatous colonic polyps 10/18/2020  ? History of gastric polyps 10/18/2020  ? Iron deficiency 10/18/2020  ? History of anemia 10/18/2020  ? Gastric intestinal metaplasia 10/18/2020  ? Aortic atherosclerosis ([place]) 08/01/2020  ? Secondary hypercoagulable state ([place]) 03/22/2020  ? Personal history of colon cancer 01/08/2020  ? Epigastric pain 01/08/2020  ? Abnormal findings on esophagogastroduodenoscopy (EGD) 01/08/2020  ? Calculus of gallbladder without cholecystitis without obstruction  01/08/2020  ? Vertigo 11/25/2019  ? Cancer of sigmoid colon ([place]) 12/12/2018  ? Pleural effusion on right 07/15/2018  ? Colon adenocarcinoma ([place]) 06/30/2018  ? Acute GI bleeding 06/23/2018  ? Acute blood loss anemia 06/23/2018  ? Depression 05/06/2018  ? Syncope 01/29/2018  ? Chronic diastolic CHF (congestive heart failure) ([place]) 05/04/2017  ? CAD (coronary artery disease) 05/03/2017  ? MGUS (monoclonal gammopathy of unknown significance) 03/07/2016  ? Benign essential HTN 01/19/2016  ? OSA (obstructive sleep apnea) 08/29/2015  ? PAF (paroxysmal atrial fibrillation) ([name])   ? Glaucoma 06/22/2015  ? DOE (dyspnea on exertion) 02/04/2015  ? Memory loss, short term 08/03/2014  ? Symptomatic anemia 12/28/2013  ? Occlusion and stenosis of carotid artery without mention of cerebral infarction 07/09/2013  ? COPD GOLD II  07/09/2013  ? Chronic renal disease, stage 3, moderately decreased glomerular filtration rate between 30-59 mL/min/1.73 square meter ([place]) 07/06/2013  ? TIA (transient ischemic attack) 07/06/2013  ? Basal cell carcinoma of skin 10/10/2012  ? History of lung cancer 03/11/2012  ? PAD (peripheral artery disease) ([place]) 12/16/2011  ? AAA (abdominal aortic aneurysm) ([place]) 09/27/2011  ? Squamous cell lung cancer ([place]) 11/08/2010  ? Allergic rhinitis 07/28/2010  ? GAIT IMBALANCE 03/02/2009  ? Diabetes mellitus without complication ([place]) 76/16/0737  ? HYPERPLASIA PROSTATE UNS W/O UR OBST & OTH LUTS 01/15/2008  ? Peripheral vascular disease ([place]) 09/29/2007  ? LEUKOPLAKIA, VOCAL CORDS 09/29/2007  ? FEMORAL BRUIT 09/29/2007  ? ? ?Orientation RESPIRATION BLADDER Height & Weight   ?  ?Self, Time, Situation, Place ? Normal Incontinent Weight: 193 lb (87.5 kg) ?Height:  6' (182.9 cm)  ?BEHAVIORAL SYMPTOMS/MOOD NEUROLOGICAL BOWEL NUTRITION STATUS  ?    Continent Diet (see discharge summary)  ?AMBULATORY STATUS COMMUNICATION OF NEEDS Skin   ?Limited Assist Verbally Other (Comment), Normal (ecchymosis,dry) ?  ?  ?  ?    ?      ?     ? ? ?Personal Care Assistance Level of Assistance  ?Bathing, Feeding, Dressing Bathing Assistance: Limited assistance ?Feeding assistance: Limited assistance ?Dressing Assistance: Maximum assistance ?   ? ?Functional Limitations Info  ?Sight, Hearing, Speech Sight Info: Impaired ?Hearing Info: Adequate ?Speech Info: Adequate  ? ? ?SPECIAL CARE FACTORS FREQUENCY  ?PT (By licensed PT), OT (By licensed OT)   ?  ?PT Frequency: 5x week ?OT Frequency: 5x week ?  ?  ?  ?   ? ? ?Contractures Contractures Info: Not present  ? ? ?Additional Factors Info  ?Code Status, Allergies Code Status Info: DNR ?Allergies Info: Niacin-lovastatin Er, Penicillins, Sulfonamide Derivatives, Atorvastatin ?  ?  ?  ?   ? ?Current Medications (02/14/2022):  This is the current hospital active medication list ?Current Facility-Administered Medications  ?Medication Dose Route Frequency Provider Last Rate Last Admin  ? acetaminophen (TYLENOL) tablet 650 mg  650 mg Oral Q6H PRN Cirigliano, Vito V, DO   650 mg at 02/13/22 1307  ? Or  ? acetaminophen (TYLENOL) suppository 650 mg  650 mg Rectal Q6H PRN Cirigliano, Vito V, DO      ? albuterol (PROVENTIL) (2.5 MG/3ML) 0.083% nebulizer solution 3 mL  3 mL Inhalation Q6H PRN Cirigliano, Vito V, DO      ? arformoterol (BROVANA) nebulizer solution 15 mcg  15 mcg Nebulization BID Cirigliano, Vito V, DO   15 mcg at 02/14/22 0850  ? And  ? umeclidinium bromide (INCRUSE ELLIPTA) 62.5 MCG/ACT 1 puff  1 puff Inhalation Daily Cirigliano, Vito V, DO   1 puff at 02/14/22 0850  ? brinzolamide (AZOPT) 1 % ophthalmic suspension 1 drop  1 drop Both Eyes TID Cirigliano, Vito V, DO   1 drop at 02/14/22 0937  ? And  ? brimonidine (ALPHAGAN) 0.2 % ophthalmic solution 1 drop  1 drop Both Eyes TID Cirigliano, Vito V, DO   1 drop at 02/14/22 0938  ? budesonide (PULMICORT) nebulizer solution 0.25 mg  0.25 mg Nebulization BID Cirigliano, Vito V, DO   0.25 mg at 02/14/22 0850  ? cyanocobalamin ((VITAMIN B-12)) injection  1,000 mcg  1,000 mcg Subcutaneous Daily Cirigliano, Vito V, DO   1,000 mcg at 02/14/22 8099  ? escitalopram (LEXAPRO) tablet 10 mg  10 mg Oral Daily Cirigliano, Vito V, DO   10 mg at 02/14/22 8338  ? furosemide (LASIX) tablet 60 mg  60 mg Oral BID Raiford Noble Latif, DO      ? hydrALAZINE (APRESOLINE) injection 5 mg  5 mg Intravenous Q4H PRN Cirigliano, Vito V, DO      ? icosapent Ethyl (VASCEPA) 1 g capsule 2 g  2 g Oral BID Cirigliano, Vito V, DO   2 g at 02/14/22 2505  ? latanoprost (XALATAN) 0.005 % ophthalmic solution 1 drop  1 drop Both Eyes QHS Cirigliano, Vito V, DO   1 drop at 02/13/22 2155  ? melatonin tablet 20 mg  20 mg Oral QHS Cirigliano, Vito V, DO   20 mg at 02/13/22 2151  ? metoprolol succinate (TOPROL-XL) 24 hr tablet 12.5 mg  12.5 mg Oral QHS Cirigliano, Vito V, DO   12.5 mg at 02/13/22 2151  ?  montelukast (SINGULAIR) tablet 10 mg  10 mg Oral QHS Cirigliano, Vito V, DO   10 mg at 02/13/22 2152  ? morphine (PF) 2 MG/ML injection 2 mg  2 mg Intravenous Q2H PRN Cirigliano, Vito V, DO      ? ondansetron (ZOFRAN) tablet 4 mg  4 mg Oral Q6H PRN Cirigliano, Vito V, DO      ? Or  ? ondansetron (ZOFRAN) injection 4 mg  4 mg Intravenous Q6H PRN Cirigliano, Vito V, DO      ? pantoprazole (PROTONIX) EC tablet 40 mg  40 mg Oral BID Vena Rua, PA-C      ? rosuvastatin (CRESTOR) tablet 40 mg  40 mg Oral Daily Cirigliano, Vito V, DO   40 mg at 02/14/22 6546  ? ? ? ?Discharge Medications: ?Please see discharge summary for a list of discharge medications. ? ?Relevant Imaging Results: ? ?Relevant Lab Results: ? ? ?Additional Information ?SS# [id number], pt vaccinated for covid with 2 boosters ? ?Joanne Chars, LCSW ? ? ? ? ?

## 2022-02-14 NOTE — Progress Notes (Signed)
SATURATION QUALIFICATIONS: (This note is used to comply with regulatory documentation for home oxygen) ? ?Patient Saturations on Room Air at Rest = 92% ? ?Patient Saturations on Room Air while Ambulating = 84% ? ?Patient Saturations on 3 Liters of oxygen while Ambulating = 94% ? ?Please briefly explain why patient needs home oxygen: ?Patient O2 sats drop while trying to ambulate ? ?Cane Dubray, Tivis Ringer, RN ? ?

## 2022-02-14 NOTE — Discharge Summary (Signed)
Physician Discharge Summary   Patient: Troy Lepkowski [name] MRN: [id number] DOB: 02-04-1941  Admit date:     02/12/2022  Discharge date: 02/16/22  Discharge Physician: Raiford Noble, DO   PCP: Binnie Rail, MD   Recommendations at discharge:   Follow up with PCP within 1-2 weeks and repeat CBC,CMP,Mag,Phos within 1 week Follow up with Cardiology within 1-2 weeks Follow up with Gastroenterology and continuing PPI BID x8 weeks   Discharge Diagnoses: Principal Problem:   Occult GI bleeding Active Problems:   Diabetes mellitus without complication (HCC)   AAA (abdominal aortic aneurysm) (HCC)   Chronic renal disease, stage 3, moderately decreased glomerular filtration rate between 30-59 mL/min/1.73 square meter (HCC)   COPD GOLD II    Symptomatic anemia   Glaucoma   PAF (paroxysmal atrial fibrillation) ([place])   Hyperlipidemia   Fall at home, initial encounter   Vitamin B12 deficiency   Gastric polyps   Duodenal diverticulum   Duodenal ulcer  Resolved Problems:   * No resolved hospital problems. [facility] Course: Patient is an 81 year old gentleman history of AAA, A-fib on chronic anticoagulation with Eliquis, BPH, chronic diastolic CHF, CKD stage IIIb, glaucoma with severe visual impairment, colon/lung cancer, COPD, hypertension, hyperlipidemia, PVD status post stents presented with a fall.  Patient noted to have had an ERCP done on 02/06/2022 and noted that sedation made him constipated.  Patient noted he was having stools that was soft and dark.  Patient noted to have been discharged home feeling like hell and had been in bed since then initially getting over anesthesia and then generalized weakness.  Patient with weakness, dizziness with decreased appetite.  Patient noted on day of admission presented to go to the bathroom got lightheaded, woozy fell in the bathroom denies any loss of consciousness.  Hit his forehead and had a few minor scrapes.  Patient seen in the ED noted to  have a hemoglobin of 8 from 13, positive melena, placed on PPI, GI consulted.  Patient admitted, seen by GI underwent upper endoscopy 02/13/2022.  Hemoglobin continued to drop and patient transfused 2 units packed red blood cells.  **Interim History Blood Count stablized and PT/OT evaluated and recommending SNF. Diet has been advanced and will add Eliquis back later today per GI recc's. Patient appeared a little volume overloaded so he was given some IV Lasix with improvement. His Hypoxia is improving and now doing well. Medically stable for D/C and will go to SNF. Will need to follow up with PCP, Cardiology, and Gastroenterology.   Assessment and Plan:  Upper GI bleed/acute blood loss anemia/nonbleeding duodenal ulcer with visible vessel, duodenal diverticulum, multiple gastric polyps -Patient had presented with near syncopal episode, noted to be anemic, positive FOBT, concern for melanotic stools, hemoglobin noted at 8.2 from 13.4 on 01/25/2022. -Patient noted to have had a recent ERCP done and initial concern was for traumatic injury from ERCP with mild bleeding in the setting of anticoagulation with Eliquis. -Patient seen in consultation by GI, patient placed on IV PPI twice daily, placed on IV fluids, made n.p.o. and subsequently underwent upper endoscopy earlier today 02/13/2022 which showed multiple gastric polyps 1 actively oozing status post resection, nonbleeding duodenal ulcer with visible vessel noted status post epinephrine and cautery, nonbleeding duodenal diverticulum. -Hemoglobin trickled down as low as 6.7. -Anemia panel consistent with anemia of chronic disease as it showed an iron level of 62, U IBC of 260, TIBC of 322, saturation ratios of 19%, ferritin level of  85, folate level of 12.2 and a vitamin B12 level of 166 -Patient's hemoglobin/hematocrit is now stable and was 9.3/28.7 -2 units packed red blood cells ordered and transfused on 02/13/2022 -Continue IV PPI twice daily and  transition to p.o. twice daily for 8 weeks and then 40 mg daily. -Continue to hold Eliquis and could likely resume today. per GI -Follow H&H. -Diet advanced from FULL to SOFT today -Per GI c/w High Dose PPI x8 weeks and then reduce to 40 mg po Pantoprazole daily  -Transfusion threshold hemoglobin < 8. -After D/C will need to repeat CBC in 7-10 days; GI has also ordered an Abdominal X-Ray as an outpatient to ensure pancreatic stent has appropriately migrated  -PT OT evaluated and recommending SNF given his extreme weakness and unsteadiness;   Vitamin B12 Deficiency -Vitamin B12 levels at 166. -Vitamin B12 1000 mcg subcu daily during the hospitalization and can transition to oral vitamin B12 supplementation on discharge.  Paroxysmal atrial fibrillation -Continue Metoprolol Succinate 12.5 mg p.o. nightly for rate control. -Hold Eliquis secondary to Above -Defer resumption of anticoagulation to Gastroenterology and they are recommending resuming on 02/15/2022  Fall with generalized deconditioning -Patient with scattered abrasions and bruises. -Status post laceration repair to forehead. -CT head CT C-spine negative for any acute abnormalities. -PT OT evaluated and recommending SNF given his unsteadiness  Hypertension -Blood pressure noted to be soft on admission. -Continued to hold home regimen Amlodipine and Cozaar. -Continue Metoprolol Succinate 12.5 mg p.o. nightly -Continue to Monitor BP per Protocol -Last BP reading is now 129/72   Hypokalemia -Patient's K+ is now 3.2 -Replete with po Kcl 40 mEQ BID x2 -Continue to Monitor and Replete as Necessary -Repeat CMP in the AM  Hyperlipidemia -Continue Rosuvastatin 4 mg p.o., Vascepa 2 g p.o. twice daily.  Diabetes Mellitus Type 2 -Hemoglobin A1c 6.7 (01/10/2022). -Continue to Hold home regimen Jardiance -Was never initiated on a sensitive NovoLog sliding scale -CBGs ranging from 129-216 on daily BMP/CMP  Glaucoma -Patient with  advanced visual impairment -Continue Latanoprost, Simbrinza.  AAA -Outpatient follow-up. -Continue beta-blocker as above  Chronic diastolic CHF -Resume home p.o. Lasix regimen today -Was on IV fluids which we will discontinue as patient being transfused 2 units packed red blood cells. -IV Lasix now stopped  -Continue beta-blocker. -Strict I's and O's and daily weights -Patient has +331 mL since admission -Continue to monitor for signs and symptoms of volume overload  Mood Disorder -Continue with escitalopram 10 mg p.o. daily  CKD stage IIIb -Stable.  Patient's BUNs/creatinine is now 18/1.36 yesterday and today it is 21/1.49 and trended down from 46/1.83 on admission -Avoid further nephrotoxic medications, contrast dyes, hypotension and dehydration to ensure adequate renal perfusion and renally dose medications -Repeat CMP in a.m.   Acute respiratory failure with hypoxia in the setting of COPD and Chronic Diastolic CHF -Patient desaturated on ambulatory home O2 screen and required 3 L to bring his O2 saturations greater than 92% -Patient was noted to be wheezing on examination today -Continue with albuterol nebs 3 mL every 6 as needed for wheezing or shortness of breath as well as Brovana 15 mcg nebs twice daily associated with Incruse Ellipta 1 puff IH daily -Continue with budesonide 0.25 mg nebs twice daily -SpO2: 92 % O2 Flow Rate (L/min): 2 L/min; Now off of O2 -We will add guaifenesin 1200 mg p.o. twice daily, flutter valve and incentive spirometry -Resumed Home Lasix Regimen  -CXR done yesterday given his hypoxia and it showed "Central pulmonary  vessels are more prominent suggesting possible CHF. Increased interstitial markings are seen in the parahilar regions and lower lung fields suggesting interstitial pulmonary edema or multifocal pneumonia. Right pleural effusion." -Repeat CXR this AM showed "Mild right basilar atelectasis is noted with small right pleural effusion."   -Repeat ambulatory home O2 screen in the morning    Consultants: Gastroenterology   Procedures performed:   CT head CT C-spine 02/12/2022 Chest x-ray 02/12/2022 Transfusion 2 units packed red blood cells 02/13/2022   EGD Findings:      The examined esophagus was normal.      Multiple small sessile polyps were found in the gastric fundus and in       the gastric body. One 4 mm polyp had active oozing. While the more       likely culprit bleeding lesion was the sphincterotomy site ulcer below,       given the active oozing, anemia, and need to eventually restart       anticoagulation, the polyp was removed with a cold snare then treated       with a single hemostatic clip with closure and cessation of bleeding.       Resection and retrieval were complete. There was no bleeding at the end       of the procedure.      Mild inflammation characterized by congestion (edema) and erythema was       found in the gastric fundus and in the gastric body. This was previously       biopsied and benign.      One non-bleeding duodenal ulcer with a visible vessel was found at the       major papilla. The lesion was 3 mm in largest dimension. Area was       successfully injected with 3 mL of a 1:10,000 solution of epinephrine       for hemostasis with appropriate mucosal blanching. Coagulation for       hemostasis using bicap heater probe was successful.      A non-bleeding diverticulum was found in the second portion of the       duodenum. This was lavaged and had a clean base without stigmata of       bleeding. A pancreatic stent was noted in position. The remainder of the       visualized duodenum was normal appearing. Impression:               - Normal esophagus.                           - Multiple gastric polyps. One was actively oozing                            upon endoscope insertion. Resected with a cold                            snare then clip closed with one hemostatic clip.                            - Gastritis.                           - Non-bleeding duodenal ulcer with a visible vessel  located at the major papilla. Injected with                            epinephrine then treated with a bicap heater probe.                           - Non-bleeding duodenal diverticulum. Recommendation:           - Return patient to hospital ward for ongoing care.                           - Full liquid diet today, and if no sign of                            rebleeding, can advance diet as tolerated tomorrow.                           - Continue serial Hgb/Hct checks.                           - Continue holding Eliquis today. Will engage with                            the Hospitalist service regarding prolonged hold to                            allow continued mucosal healing.                           - Continue other medications.                           - Await pathology results.                           - Based on appearance and positioning, suspect the                            pancreatic stent will appropriately fall out in the                            next couple of days. Will obtain KUB prior to                            discharge to confirm.                           - GI service will continue to follow.  Disposition: Skilled nursing facility Diet recommendation:  Cardiac diet  DISCHARGE MEDICATION: Allergies as of 02/16/2022       Reactions   Niacin-lovastatin Er Shortness Of Breath, Other (See Comments)   Dyspnea, flushing   Penicillins Hives, Shortness Of Breath, Other (See Comments)   Flushing  & dyspnea Because of a history of documented adverse serious drug reaction;Medi Alert bracelet  is recommended PCN reaction causing immediate rash, facial/tongue/throat swelling, SOB or lightheadedness with  hypotension: Yes PCN reaction causing severe rash involving mucus membranes or skin necrosis: No PCN reaction occurring within the  last 10 years: NO PCN reaction that required hospitalization: NO   Sulfonamide Derivatives Hives, Shortness Of Breath, Other (See Comments)   Flushing & dyspnea Because of a history of documented adverse serious drug reaction;Medi Alert bracelet  is recommended   Atorvastatin Other (See Comments)   Myalgias & athralgias- takes Crestor, DOES NOT LIKE LIPITOR        Medication List     STOP taking these medications    omeprazole 20 MG capsule Commonly known as: PRILOSEC       TAKE these medications    acetaminophen 325 MG tablet Commonly known as: TYLENOL Take 650 mg by mouth every 6 (six) hours as needed for moderate pain or headache.   albuterol 108 (90 Base) MCG/ACT inhaler Commonly known as: VENTOLIN HFA Inhale 2 puffs into the lungs every 6 (six) hours as needed for wheezing or shortness of breath.   amLODipine 5 MG tablet Commonly known as: NORVASC TAKE ONE TABLET BY MOUTH EVERY DAY   apixaban 5 MG Tabs tablet Commonly known as: Eliquis Take 1 tablet (5 mg total) by mouth 2 (two) times daily.   Breztri Aerosphere 160-9-4.8 MCG/ACT Aero Generic drug: Budeson-Glycopyrrol-Formoterol Inhale 2 puffs into the lungs in the morning and at bedtime.   CENTRUM SILVER PO Take 1 tablet by mouth daily.   docusate sodium 100 MG capsule Commonly known as: COLACE Take 100 mg by mouth 2 (two) times daily.   escitalopram 10 MG tablet Commonly known as: LEXAPRO TAKE ONE TABLET BY MOUTH EVERY DAY   furosemide 20 MG tablet Commonly known as: LASIX Take 3 tablets (60 mg total) by mouth 2 (two) times daily.   Jardiance 10 MG Tabs tablet Generic drug: empagliflozin TAKE ONE TABLET BY MOUTH EVERY DAY BEFORE BREAKFAST.   latanoprost 0.005 % ophthalmic solution Commonly known as: XALATAN Place 1 drop into both eyes at bedtime.   losartan 25 MG tablet Commonly known as: COZAAR TAKE ONE TABLET BY MOUTH EVERY DAY   Melatonin 10 MG Tabs Take 20 mg by mouth at bedtime.    metoprolol succinate 25 MG 24 hr tablet Commonly known as: TOPROL-XL TAKE 1/2 TABLET EVERY EVENING WITH OR IMMEDIATELY FOLLOWING A MEAL What changed: See the new instructions.   montelukast 10 MG tablet Commonly known as: SINGULAIR TAKE ONE TABLET AT BEDTIME   ondansetron 4 MG tablet Commonly known as: ZOFRAN Take 1 tablet (4 mg total) by mouth every 6 (six) hours as needed for nausea.   OVER THE COUNTER MEDICATION Take 3 capsules by mouth daily. Balance of Nature Fruits   OVER THE COUNTER MEDICATION Take 3 capsules by mouth daily. Balance of Nature Vegetables   pantoprazole 40 MG tablet Commonly known as: PROTONIX Take 1 tablet (40 mg total) by mouth 2 (two) times daily.   potassium chloride SA 20 MEQ tablet Commonly known as: KLOR-CON M Take 2 tablets (40 mEq total) by mouth every morning AND 1 tablet (20 mEq total) every evening.   rosuvastatin 40 MG tablet Commonly known as: CRESTOR TAKE ONE TABLET BY MOUTH EVERY DAY   senna 8.6 MG tablet Commonly known as: SENOKOT Take 1 tablet by mouth 2 (two) times daily.   Simbrinza 1-0.2 % Susp Generic drug: Brinzolamide-Brimonidine Apply 1 drop to eye in the morning, at noon, and at bedtime.   Vascepa 1 g capsule Generic drug: icosapent Ethyl Take 2  capsules (2 g total) by mouth 2 (two) times daily.   vitamin B-12 1000 MCG tablet Commonly known as: CYANOCOBALAMIN Take 1 tablet (1,000 mcg total) by mouth daily.               Discharge Care Instructions  (From admission, onward)           Start     Ordered   02/16/22 0000  Discharge wound care:       Comments: Keep Head Dry and Clean   02/16/22 1220            Contact information for after-discharge care     Destination     [facility] Preferred SNF .   Service: Skilled Nursing Contact information: [address] [place] [phone]                    Discharge Exam: Danley Danker  Weights   02/12/22 1315  Weight: 87.5 kg   Vitals:   02/16/22 0754 02/16/22 0755  BP:    Pulse:  (!) 59  Resp:  17  Temp:    SpO2: 92% 92%   Examination: Physical Exam:  Constitutional: WN/WD overweight elderly Caucasian male in NAD appears calm Respiratory: Diminished to auscultation bilaterally, no wheezing, rales, rhonchi or crackles. Normal respiratory effort and patient is not tachypenic. No accessory muscle use. Unlabored breathing  Cardiovascular: RRR, no murmurs / rubs / gallops. S1 and S2 auscultated. Minimal extremity edema. Abdomen: Soft, non-tender, Distended 2/2 body habitus. Bowel sounds positive.  GU: Deferred. Musculoskeletal: No clubbing / cyanosis of digits/nails. No joint deformity upper and lower extremities. Neurologic: CN 2-12 grossly intact with no focal deficits.   Condition at discharge: stable  The results of significant diagnostics from this hospitalization (including imaging, microbiology, ancillary and laboratory) are listed below for reference.   Imaging Studies: CT Head Wo Contrast  Result Date: 02/12/2022 CLINICAL DATA:  Fall, head and neck trauma EXAM: CT HEAD WITHOUT CONTRAST CT CERVICAL SPINE WITHOUT CONTRAST TECHNIQUE: Multidetector CT imaging of the head and cervical spine was performed following the standard protocol without intravenous contrast. Multiplanar CT image reconstructions of the cervical spine were also generated. RADIATION DOSE REDUCTION: This exam was performed according to the departmental dose-optimization program which includes automated exposure control, adjustment of the mA and/or kV according to patient size and/or use of iterative reconstruction technique. COMPARISON:  None Available. CT head dated June 23, 2018 FINDINGS: CT HEAD FINDINGS Brain: No evidence of acute infarction, hemorrhage, hydrocephalus, extra-axial collection or mass lesion/mass effect. Mild cerebral atrophy and chronic microvascular ischemic changes of the  white matter. Vascular: No hyperdense vessel or unexpected calcification. Skull: Normal. Negative for fracture or focal lesion. Sinuses/Orbits: No acute finding.  Bilateral cataract surgery. Other: None. CT CERVICAL SPINE FINDINGS Alignment: Straightening of the cervical spine. Skull base and vertebrae: No acute fracture. No primary bone lesion or focal pathologic process. Soft tissues and spinal canal: No prevertebral fluid or swelling. No visible canal hematoma. Disc levels: Multilevel degenerate disc disease with disc space loss and osteophytes prominent at C4-C5, C5-C6 and C6-C7. Mild moderate multilevel uncovertebral joint and facet joint arthropathy. Moderate left neural foraminal stenosis at C4-C5 and moderate neural foraminal stenosis at C5-C6. Upper chest: No acute abnormality. Other: None IMPRESSION: 1. No acute intracranial abnormality. 2. Mild cerebral atrophy and chronic microvascular ischemic changes of the white matter. 3. No acute cervical spine fracture or traumatic subluxation. 4. Moderate multilevel degenerate disc  disease prominent at C4-C5 and C5-C6. Uncovertebral joint and facet joint arthropathy with neural foraminal stenosis prominent at C5-C6. Electronically Signed   By: Keane Police D.O.   On: 02/12/2022 13:55   CT Cervical Spine Wo Contrast  Result Date: 02/12/2022 CLINICAL DATA:  Fall, head and neck trauma EXAM: CT HEAD WITHOUT CONTRAST CT CERVICAL SPINE WITHOUT CONTRAST TECHNIQUE: Multidetector CT imaging of the head and cervical spine was performed following the standard protocol without intravenous contrast. Multiplanar CT image reconstructions of the cervical spine were also generated. RADIATION DOSE REDUCTION: This exam was performed according to the departmental dose-optimization program which includes automated exposure control, adjustment of the mA and/or kV according to patient size and/or use of iterative reconstruction technique. COMPARISON:  None Available. CT head dated  June 23, 2018 FINDINGS: CT HEAD FINDINGS Brain: No evidence of acute infarction, hemorrhage, hydrocephalus, extra-axial collection or mass lesion/mass effect. Mild cerebral atrophy and chronic microvascular ischemic changes of the white matter. Vascular: No hyperdense vessel or unexpected calcification. Skull: Normal. Negative for fracture or focal lesion. Sinuses/Orbits: No acute finding.  Bilateral cataract surgery. Other: None. CT CERVICAL SPINE FINDINGS Alignment: Straightening of the cervical spine. Skull base and vertebrae: No acute fracture. No primary bone lesion or focal pathologic process. Soft tissues and spinal canal: No prevertebral fluid or swelling. No visible canal hematoma. Disc levels: Multilevel degenerate disc disease with disc space loss and osteophytes prominent at C4-C5, C5-C6 and C6-C7. Mild moderate multilevel uncovertebral joint and facet joint arthropathy. Moderate left neural foraminal stenosis at C4-C5 and moderate neural foraminal stenosis at C5-C6. Upper chest: No acute abnormality. Other: None IMPRESSION: 1. No acute intracranial abnormality. 2. Mild cerebral atrophy and chronic microvascular ischemic changes of the white matter. 3. No acute cervical spine fracture or traumatic subluxation. 4. Moderate multilevel degenerate disc disease prominent at C4-C5 and C5-C6. Uncovertebral joint and facet joint arthropathy with neural foraminal stenosis prominent at C5-C6. Electronically Signed   By: Keane Police D.O.   On: 02/12/2022 13:55   MR ABDOMEN MRCP WO CONTRAST  Result Date: 01/18/2022 CLINICAL DATA:  Elevated liver function tests. Dilated common bile duct on recent ultrasound. Personal history of colon carcinoma. EXAM: MRI ABDOMEN WITHOUT CONTRAST  (INCLUDING MRCP) TECHNIQUE: Multiplanar multisequence MR imaging of the abdomen was performed. Heavily T2-weighted images of the biliary and pancreatic ducts were obtained, and three-dimensional MRCP images were rendered by post  processing. COMPARISON:  Ultrasound on 01/12/2022 and PET-CT on 02/08/2021 FINDINGS: Lower chest: No acute findings. Small right pleural effusion versus thickening and right lower lung nodular density are stable compared to prior chest CT on 09/12/2021. Hepatobiliary: No masses visualized on this unenhanced exam. Gallbladder is incompletely distended but otherwise unremarkable in appearance. Mild diffuse biliary ductal dilatation is seen, with common bile duct measuring 12 mm. Two filling defects are seen in the distal common bile duct, largest measuring 10 mm, consistent with choledocholithiasis. Pancreas: No mass or inflammatory process visualized on this unenhanced exam. Spleen:  Within normal limits in size. Adrenals/Urinary tract: Mild-to-moderate diffuse left renal parenchymal atrophy noted. No evidence of mass or hydronephrosis. Stomach/Bowel: Unremarkable. Vascular/Lymphatic: No pathologically enlarged lymph nodes identified. No evidence of abdominal aortic aneurysm. Other:  None. Musculoskeletal:  No suspicious bone lesions identified. IMPRESSION: Prior cholecystectomy. Mild diffuse biliary ductal dilatation due to choledocholithiasis. Electronically Signed   By: Marlaine Hind M.D.   On: 01/18/2022 14:33   DG CHEST PORT 1 VIEW  Result Date: 02/16/2022 CLINICAL DATA:  Shortness of breath. EXAM:  PORTABLE CHEST 1 VIEW COMPARISON:  Feb 14, 2022. FINDINGS: The heart size and mediastinal contours are within normal limits. Left lung is clear. Mild right basilar atelectasis is noted with small right pleural effusion. The visualized skeletal structures are unremarkable. IMPRESSION: Mild right basilar atelectasis is noted with small right pleural effusion. Electronically Signed   By: Marijo Conception M.D.   On: 02/16/2022 09:44   DG CHEST PORT 1 VIEW  Result Date: 02/14/2022 CLINICAL DATA:  Trauma, fall EXAM: PORTABLE CHEST 1 VIEW COMPARISON:  Previous studies including the examination of 02/12/2022 FINDINGS:  Transverse diameter of heart is increased. Central pulmonary vessels are more prominent. Increased interstitial markings are seen in the parahilar regions and lower lung fields. There is blunting of right lateral CP angle. Right lung is smaller than left. There is asymmetric pleural thickening in the right apex. IMPRESSION: Central pulmonary vessels are more prominent suggesting possible CHF. Increased interstitial markings are seen in the parahilar regions and lower lung fields suggesting interstitial pulmonary edema or multifocal pneumonia. Right pleural effusion. Electronically Signed   By: Elmer Picker M.D.   On: 02/14/2022 15:36   DG Chest Portable 1 View  Result Date: 02/12/2022 CLINICAL DATA:  Syncope, fall, initial encounter. EXAM: PORTABLE CHEST 1 VIEW COMPARISON:  CT chest 09/12/2021 and chest radiograph 10/24/2021. FINDINGS: Trachea is midline. Heart size stable. Loop recorder projects over the left heart. Moderate right pleural effusion appears partially loculated. Haziness in the right lung base. Findings appears similar to comparison exams. Left lung is clear. IMPRESSION: 1. Pleuroparenchymal scarring in the lower right hemithorax a moderate right fibrothorax. 2. No acute findings. Electronically Signed   By: Lorin Picket M.D.   On: 02/12/2022 13:32   DG ERCP  Result Date: 02/06/2022 CLINICAL DATA:  Evidence of choledocholithiasis by imaging. EXAM: ERCP TECHNIQUE: Multiple spot images obtained with the fluoroscopic device and submitted for interpretation post-procedure. COMPARISON:  MRI/MRCP on 01/18/2022 FINDINGS: Imaging with a C-arm during the endoscopic procedure demonstrates cannulation of the pancreatic duct, placement of small caliber pancreatic duct stent, cannulation of the common bile duct followed by balloon sweep maneuver to remove contents. Cholangiogram shows diffuse dilatation of the common bile duct. Post balloon sweep maneuver cholangiogram does not show convincing  residual filling defect. IMPRESSION: Balloon sweep maneuvers performed to remove calculi from the common bile duct. These images were submitted for radiologic interpretation only. Please see the procedural report for the amount of contrast and the fluoroscopy time utilized. Electronically Signed   By: Aletta Edouard M.D.   On: 02/06/2022 11:53   DG C-Arm 1-60 Min-No Report  Result Date: 02/06/2022 Fluoroscopy was utilized by the requesting physician.  No radiographic interpretation.   CUP PACEART REMOTE DEVICE CHECK  Result Date: 01/24/2022 ILR summary report received. Battery status OK. Normal device function. No new symptom, tachy, brady, or pause episodes. 1 new AF episodes, duration 22min, mean HR 37.  Burden 0.1%, Eliquis.  Monthly summary reports and ROV/PRN LA   Microbiology: Results for orders placed or performed during the hospital encounter of 07/01/21  Culture, blood (routine x 2)     Status: None   Collection Time: 07/02/21  8:34 AM   Specimen: BLOOD RIGHT HAND  Result Value Ref Range Status   Specimen Description BLOOD RIGHT HAND  Final   Special Requests   Final    BOTTLES DRAWN AEROBIC AND ANAEROBIC Blood Culture results may not be optimal due to an inadequate volume of blood received in culture bottles  Culture   Final    NO GROWTH 5 DAYS Performed at [facility], [place] [address] [place], [place] [address]    Report Status 07/07/2021 FINAL  Final  Culture, blood (routine x 2)     Status: None   Collection Time: 07/02/21  8:34 AM   Specimen: BLOOD  Result Value Ref Range Status   Specimen Description BLOOD LEFT ANTECUBITAL  Final   Special Requests   Final    BOTTLES DRAWN AEROBIC AND ANAEROBIC Blood Culture adequate volume   Culture   Final    NO GROWTH 5 DAYS Performed at [facility], [place] [address], [place], [place] [address]    Report Status 07/07/2021 FINAL  Final  Body fluid culture w Gram Stain     Status: None   Collection Time: 07/05/21  10:45 AM   Specimen: Pleural Fluid  Result Value Ref Range Status   Specimen Description FLUID  Final   Special Requests  RIGHT PLEURAL  Final   Gram Stain   Final    MODERATE WBC PRESENT,BOTH PMN AND MONONUCLEAR NO ORGANISMS SEEN    Culture   Final    NO GROWTH 3 DAYS Performed at [facility], [place] [address], [place], [place] [address]    Report Status 07/08/2021 FINAL  Final   *Note: Due to a large number of results and/or encounters for the requested time period, some results have not been displayed. A complete set of results can be found in Results Review.    Labs: CBC: Recent Labs  Lab 02/12/22 1320 02/12/22 1721 02/13/22 0236 02/13/22 1248 02/13/22 1638 02/14/22 0512 02/15/22 0302 02/16/22 0141  WBC 7.3  --  6.9  --  6.9 8.0 7.1 6.3  NEUTROABS 6.3  --   --   --   --   --  6.1 5.0  HGB 8.6*   < > 7.5* 7.4* 6.7* 9.6* 9.3* 9.3*  HCT 26.3*  --  22.6* 22.6* 21.4* 28.6* 28.7* 28.7*  MCV 96.3  --  93.8  --  96.4 88.8 91.1 91.7  PLT 254  --  214  --  227 245 227 246   < > = values in this interval not displayed.   Basic Metabolic Panel: Recent Labs  Lab 02/12/22 1320 02/13/22 0236 02/14/22 0512 02/15/22 0302 02/16/22 0141  NA 137 137 139 137 138  K 4.4 3.7 4.4 3.3* 3.2*  CL 105 106 109 104 104  CO2 20* 22 22 23 24   GLUCOSE 216* 129* 137* 147* 155*  BUN 46* 36* 21 18 21   CREATININE 1.83* 1.44* 1.34* 1.36* 1.49*  CALCIUM 8.6* 8.4* 8.7* 8.5* 8.6*  MG  --   --  2.2 2.1 2.2  PHOS  --   --  3.9 4.0 3.5   Liver Function Tests: Recent Labs  Lab 02/12/22 1320 02/14/22 0512 02/15/22 0302 02/16/22 0141  AST 28  --  17 22  ALT 19  --  20 21  ALKPHOS 84  --  78 78  BILITOT 1.0  --  1.0 1.0  PROT 5.5*  --  5.5* 5.9*  ALBUMIN 3.1* 3.0* 3.1* 3.1*   CBG: No results for input(s): GLUCAP in the last 168 hours.  Discharge time spent: greater than 30 minutes.  Signed: Raiford Noble, DO [facility] 02/16/2022

## 2022-02-15 DIAGNOSIS — I480 Paroxysmal atrial fibrillation: Secondary | ICD-10-CM | POA: Diagnosis not present

## 2022-02-15 DIAGNOSIS — K5710 Diverticulosis of small intestine without perforation or abscess without bleeding: Secondary | ICD-10-CM | POA: Diagnosis not present

## 2022-02-15 DIAGNOSIS — N183 Chronic kidney disease, stage 3 unspecified: Secondary | ICD-10-CM

## 2022-02-15 DIAGNOSIS — K269 Duodenal ulcer, unspecified as acute or chronic, without hemorrhage or perforation: Secondary | ICD-10-CM | POA: Diagnosis not present

## 2022-02-15 DIAGNOSIS — K317 Polyp of stomach and duodenum: Secondary | ICD-10-CM | POA: Diagnosis not present

## 2022-02-15 LAB — COMPREHENSIVE METABOLIC PANEL
ALT: 20 U/L (ref 0–44)
AST: 17 U/L (ref 15–41)
Albumin: 3.1 g/dL — ABNORMAL LOW (ref 3.5–5.0)
Alkaline Phosphatase: 78 U/L (ref 38–126)
Anion gap: 10 (ref 5–15)
BUN: 18 mg/dL (ref 8–23)
CO2: 23 mmol/L (ref 22–32)
Calcium: 8.5 mg/dL — ABNORMAL LOW (ref 8.9–10.3)
Chloride: 104 mmol/L (ref 98–111)
Creatinine, Ser: 1.36 mg/dL — ABNORMAL HIGH (ref 0.61–1.24)
GFR, Estimated: 52 mL/min — ABNORMAL LOW (ref >60)
Glucose, Bld: 147 mg/dL — ABNORMAL HIGH (ref 70–99)
Potassium: 3.3 mmol/L — ABNORMAL LOW (ref 3.5–5.1)
Sodium: 137 mmol/L (ref 135–145)
Total Bilirubin: 1 mg/dL (ref 0.3–1.2)
Total Protein: 5.5 g/dL — ABNORMAL LOW (ref 6.5–8.1)

## 2022-02-15 LAB — CBC WITH DIFFERENTIAL/PLATELET
Abs Immature Granulocytes: 0.1 10*3/uL — ABNORMAL HIGH (ref 0.00–0.07)
Basophils Absolute: 0 10*3/uL (ref 0.0–0.1)
Basophils Relative: 0 %
Eosinophils Absolute: 0.4 10*3/uL (ref 0.0–0.5)
Eosinophils Relative: 5 %
HCT: 28.7 % — ABNORMAL LOW (ref 39.0–52.0)
Hemoglobin: 9.3 g/dL — ABNORMAL LOW (ref 13.0–17.0)
Lymphocytes Relative: 6 %
Lymphs Abs: 0.4 10*3/uL — ABNORMAL LOW (ref 0.7–4.0)
MCH: 29.5 pg (ref 26.0–34.0)
MCHC: 32.4 g/dL (ref 30.0–36.0)
MCV: 91.1 fL (ref 80.0–100.0)
Monocytes Absolute: 0.1 10*3/uL (ref 0.1–1.0)
Monocytes Relative: 2 %
Myelocytes: 1 %
Neutro Abs: 6.1 10*3/uL (ref 1.7–7.7)
Neutrophils Relative %: 86 %
Platelets: 227 10*3/uL (ref 150–400)
RBC: 3.15 MIL/uL — ABNORMAL LOW (ref 4.22–5.81)
RDW: 22.6 % — ABNORMAL HIGH (ref 11.5–15.5)
WBC: 7.1 10*3/uL (ref 4.0–10.5)
nRBC: 0 /100 WBC
nRBC: 0.3 % — ABNORMAL HIGH (ref 0.0–0.2)

## 2022-02-15 LAB — PHOSPHORUS: Phosphorus: 4 mg/dL (ref 2.5–4.6)

## 2022-02-15 LAB — MAGNESIUM: Magnesium: 2.1 mg/dL (ref 1.7–2.4)

## 2022-02-15 MED ORDER — FUROSEMIDE 10 MG/ML IJ SOLN
20.0000 mg | Freq: Once | INTRAMUSCULAR | Status: AC
  Administered 2022-02-15: 20 mg via INTRAVENOUS
  Filled 2022-02-15: qty 2

## 2022-02-15 MED ORDER — POTASSIUM CHLORIDE CRYS ER 20 MEQ PO TBCR
40.0000 meq | EXTENDED_RELEASE_TABLET | Freq: Two times a day (BID) | ORAL | Status: AC
  Administered 2022-02-15 (×2): 40 meq via ORAL
  Filled 2022-02-15 (×2): qty 2

## 2022-02-15 NOTE — Progress Notes (Signed)
Physical Therapy Treatment Patient Details Name: Troy Kreitzer [name] MRN: [id number] DOB: 1941/09/22 Today's Date: 02/15/2022   History of Present Illness Pt is an 81 y/o male admitted secondary to fall, SOB, and GI bleeding. Pt is s/p EGD and found bleeding gastric polyp. PMH includes colon/lung cacner, DM, COPD, glaucoma, a fib, AAA, and dCHF.    PT Comments    Pt received supine and agreeable to session with fair progress towards acute goals with session focused on increased ambulation tolerance and trial of ambulation without AD. Pt able to come to sitting EOB with min guard for safety and demonstrating fair sitting balance without UE support for tolieting. Pt requiring mod assist to come to standing to steady on rise as pt coming to partial standing x2 and sitting back on bed before coming to full upright standing on third try. Pt able to demonstrate ambulation with min assist down to min guard at times with HHA x1 and chair follow for safety. Anticipate RW cumbersome and difficult for pt with vision loss as pt "furniture surfs" at home as wayfinding. Pt encouraged to hover hand on rail in hall as wayfidning and increased stability. Pt with most noted instability out in open while reaching for doorway, furniture etc despite cues for location. Current plan remains appropriate to address deficits and maximize functional independence and decrease caregiver burden. Pt continues to benefit from skilled PT services to progress toward functional mobility goals.    Recommendations for follow up therapy are one component of a multi-disciplinary discharge planning process, led by the attending physician.  Recommendations may be updated based on patient status, additional functional criteria and insurance authorization.  Follow Up Recommendations  Skilled nursing-short term rehab (<3 hours/day)     Assistance Recommended at Discharge Frequent or constant Supervision/Assistance  Patient can return  home with the following A lot of help with walking and/or transfers;A lot of help with bathing/dressing/bathroom;Assistance with cooking/housework;Direct supervision/assist for medications management;Direct supervision/assist for financial management;Assist for transportation;Help with stairs or ramp for entrance   Equipment Recommendations  Wheelchair (measurements PT);Wheelchair cushion (measurements PT)    Recommendations for Other Services       Precautions / Restrictions Precautions Precautions: Fall Precaution Comments: legally blind, monitor O2/orthostatics Restrictions Weight Bearing Restrictions: No     Mobility  Bed Mobility Overal bed mobility: Needs Assistance Bed Mobility: Supine to Sit     Supine to sit: Min guard     General bed mobility comments: min guard for safety    Transfers Overall transfer level: Needs assistance Equipment used: 1 person hand held assist Transfers: Sit to/from Stand Sit to Stand: Min assist, Mod assist           General transfer comment: min a to stand with HHA with pt coming to partial stand and sitting back on bed x2 before coming to full upright standing    Ambulation/Gait Ambulation/Gait assistance: Min assist Gait Distance (Feet): 75 Feet Assistive device: 1 person hand held assist (hovering other hand over rail in hall as wayfinding) Gait Pattern/deviations: Step-through pattern, Decreased stride length Gait velocity: Decreased     General Gait Details: Pt slightly unsteady with HHA x1 and hovering hand over hall rail as wayfinding, more unsteady when out in open as pt reaching in air for doorway etc despite verbal cues for where furntiure etc is located. no LOB and O2 maintaining >92% throughout   Chief Strategy Officer  Modified Rankin (Stroke Patients Only)       Balance Overall balance assessment: Needs assistance Sitting-balance support: No upper extremity supported, Feet  supported Sitting balance-Leahy Scale: Fair     Standing balance support: Bilateral upper extremity supported Standing balance-Leahy Scale: Poor Standing balance comment: Reliant on SUE support                            Cognition Arousal/Alertness: Awake/alert Behavior During Therapy: WFL for tasks assessed/performed, Impulsive Overall Cognitive Status: Impaired/Different from baseline Area of Impairment: Safety/judgement, Attention, Memory, Awareness, Problem solving                   Current Attention Level: Selective Memory: Decreased short-term memory   Safety/Judgement: Decreased awareness of deficits, Decreased awareness of safety Awareness: Emergent Problem Solving: Requires verbal cues, Requires tactile cues, Difficulty sequencing General Comments: Pt with very poor safety awareness and awareness of deficits. difficulty recalling marriage anniversary - unable to report appropriate year for this but does accurately say this year is 2023. benefits from cues for sequencing though requires increased cues due to low vision (where items are, etc)        Exercises      General Comments General comments (skin integrity, edema, etc.): VSS on RA, HR max during ambulation 112bpm      Pertinent Vitals/Pain Pain Assessment Pain Assessment: No/denies pain    Home Living                          Prior Function            PT Goals (current goals can now be found in the care plan section) Acute Rehab PT Goals Patient Stated Goal: to go home PT Goal Formulation: With patient Time For Goal Achievement: 02/28/22    Frequency    Min 2X/week      PT Plan      Co-evaluation              AM-PAC PT "6 Clicks" Mobility   Outcome Measure  Help needed turning from your back to your side while in a flat bed without using bedrails?: A Little Help needed moving from lying on your back to sitting on the side of a flat bed without using  bedrails?: A Lot Help needed moving to and from a bed to a chair (including a wheelchair)?: A Lot Help needed standing up from a chair using your arms (e.g., wheelchair or bedside chair)?: A Lot Help needed to walk in hospital room?: A Lot Help needed climbing 3-5 steps with a railing? : Total 6 Click Score: 12    End of Session Equipment Utilized During Treatment: Gait belt Activity Tolerance: Patient tolerated treatment well Patient left: with call bell/phone within reach;in chair;with chair alarm set Nurse Communication: Mobility status PT Visit Diagnosis: Unsteadiness on feet (R26.81);Muscle weakness (generalized) (M62.81);Difficulty in walking, not elsewhere classified (R26.2)     Time: 1531-1601 PT Time Calculation (min) (ACUTE ONLY): 30 min  Charges:  $Gait Training: 8-22 mins $Therapeutic Activity: 8-22 mins                     Rowan Blaker R. PTA Acute Rehabilitation Services Office: [facility] 02/15/2022, 4:26 PM

## 2022-02-15 NOTE — Progress Notes (Signed)
PROGRESS NOTE    Troy Vandezande [name]  HKV:[id number] DOB: 05-01-1941 DOA: 02/12/2022 PCP: Binnie Rail, MD   Brief Narrative:  Patient is an 81 year old gentleman history of AAA, A-fib on chronic anticoagulation with Eliquis, BPH, chronic diastolic CHF, CKD stage IIIb, glaucoma with severe visual impairment, colon/lung cancer, COPD, hypertension, hyperlipidemia, PVD status post stents presented with a fall.  Patient noted to have had an ERCP done on 02/06/2022 and noted that sedation made him constipated.  Patient noted he was having stools that was soft and dark.  Patient noted to have been discharged home feeling like hell and had been in bed since then initially getting over anesthesia and then generalized weakness.  Patient with weakness, dizziness with decreased appetite.  Patient noted on day of admission presented to go to the bathroom got lightheaded, woozy fell in the bathroom denies any loss of consciousness.  Hit his forehead and had a few minor scrapes.  Patient seen in the ED noted to have a hemoglobin of 8 from 13, positive melena, placed on PPI, GI consulted.  Patient admitted, seen by GI underwent upper endoscopy 02/13/2022.  Hemoglobin continued to drop and patient transfused 2 units packed red blood cells.  **Interim History Blood Count stablized and PT/OT evaluated and recommending SNF. Diet has been advanced and will add Eliquis back later today per GI recc's. Patient appeared a little volume overloaded so he was given some IV Lasix with improvement. His Hypoxia is improving.    Assessment and Plan:  Upper GI bleed/acute blood loss anemia/nonbleeding duodenal ulcer with visible vessel, duodenal diverticulum, multiple gastric polyps -Patient had presented with near syncopal episode, noted to be anemic, positive FOBT, concern for melanotic stools, hemoglobin noted at 8.2 from 13.4 on 01/25/2022. -Patient noted to have had a recent ERCP done and initial concern was for traumatic  injury from ERCP with mild bleeding in the setting of anticoagulation with Eliquis. -Patient seen in consultation by GI, patient placed on IV PPI twice daily, placed on IV fluids, made n.p.o. and subsequently underwent upper endoscopy earlier today 02/13/2022 which showed multiple gastric polyps 1 actively oozing status post resection, nonbleeding duodenal ulcer with visible vessel noted status post epinephrine and cautery, nonbleeding duodenal diverticulum. -Hemoglobin trickled down as low as 6.7. -Anemia panel consistent with anemia of chronic disease as it showed an iron level of 62, U IBC of 260, TIBC of 322, saturation ratios of 19%, ferritin level of 85, folate level of 12.2 and a vitamin B12 level of 166 -Patient's hemoglobin/hematocrit is now improved to 9.6/20.6 yesterday and today it is 9.3/28.7 and stable from yesterday  -2 units packed red blood cells ordered and transfused on 02/13/2022 -Continue IV PPI twice daily and transition to p.o. twice daily for 8 weeks and then 40 mg daily. -Continue to hold Eliquis and could likely resume today. per GI -Follow H&H. -Diet advanced from FULL to SOFT today -Per GI c/w High Dose PPI x8 weeks and then reduce to 40 mg po Pantoprazole daily  -Transfusion threshold hemoglobin < 8. -After D/C will need to repeat CBC in 7-10 days; GI has also ordered an Abdominal X-Ray as an outpatient to ensure pancreatic stent has appropriately migrated  -PT OT evaluated and recommending SNF given his extreme weakness and unsteadiness;   Vitamin B12 Deficiency -Vitamin B12 levels at 166. -Vitamin B12 1000 mcg subcu daily during the hospitalization and can transition to oral vitamin B12 supplementation on discharge.  Paroxysmal atrial fibrillation -Continue Metoprolol Succinate  12.5 mg p.o. nightly for rate control. -Hold Eliquis secondary to Above -Defer resumption of anticoagulation to Gastroenterology and they are recommending resuming on 02/15/2022  Fall with  generalized deconditioning -Patient with scattered abrasions and bruises. -Status post laceration repair to forehead. -CT head CT C-spine negative for any acute abnormalities. -PT OT evaluated and recommending SNF given his unsteadiness  Hypertension -Blood pressure noted to be soft on admission. -Continued to hold home regimen Amlodipine and Cozaar. -Continue Metoprolol Succinate 12.5 mg p.o. nightly -Continue to Monitor BP per Protocol -Last BP reading is now 134/64  Hypokalemia -Patient's K+ is now 3.3 -Replete with po Kcl 40 mEQ BID x2 -Continue to Monitor and Replete as Necessary -Repeat CMP in the AM  Hyperlipidemia -Continue Rosuvastatin 4 mg p.o., Vascepa 2 g p.o. twice daily.  Diabetes Mellitus Type 2 -Hemoglobin A1c 6.7 (01/10/2022). -Continue to Hold home regimen Jardiance -Was never initiated on a sensitive NovoLog sliding scale -CBGs ranging from 129-216 on daily BMP/CMP  Glaucoma -Patient with advanced visual impairment -Continue Latanoprost, Simbrinza.  AAA -Outpatient follow-up. -Continue beta-blocker as above  Chronic diastolic CHF -Resume home p.o. Lasix regimen today -Was on IV fluids which we will discontinue as patient being transfused 2 units packed red blood cells. -IV Lasix in between transfusion units given yesterday and given another dose of IV Lasix 20 mg yesterday and will repeat today  -Continue beta-blocker. -Strict I's and O's and daily weights -Patient has +416 mL since admission -Continue to monitor for signs and symptoms of volume overload  Mood Disorder -Continue with escitalopram 10 mg p.o. daily  CKD stage IIIb -Stable.  Patient's BUNs/creatinine is now 18/1.36 and trended down from 46/1.83 on admission -Avoid further nephrotoxic medications, contrast dyes, hypotension and dehydration to ensure adequate renal perfusion and renally dose medications -Repeat CMP in a.m.   Acute respiratory failure with hypoxia in the setting of  COPD and Chronic Diastolic CHF -Patient desaturated on ambulatory home O2 screen and required 3 L to bring his O2 saturations greater than 92% -Patient was noted to be wheezing on examination today -Continue with albuterol nebs 3 mL every 6 as needed for wheezing or shortness of breath as well as Brovana 15 mcg nebs twice daily associated with Incruse Ellipta 1 puff IH daily -Continue with budesonide 0.25 mg nebs twice daily -SpO2: 97 % O2 Flow Rate (L/min): 2 L/min; Now off of O2 -We will add guaifenesin 1200 mg p.o. twice daily, flutter valve and incentive spirometry -Given an extra dose of IV Lasix yesterday morning given his wheezing; Now back on po Lasix 60 mg po BID and will give an extra dose of IV 20 mg again  -Check chest x-ray given his hypoxia and it showed "Central pulmonary vessels are more prominent suggesting possible CHF. Increased interstitial markings are seen in the parahilar regions and lower lung fields suggesting interstitial pulmonary edema or multifocal pneumonia. Right pleural effusion." -Repeat ambulatory home O2 screen in the morning  DVT prophylaxis: Place and maintain sequential compression device Start: 02/13/22 1823 SCDs Start: 02/12/22 1638    Code Status: DNR Family Communication: No family present at bedside   Disposition Plan:  Level of care: Telemetry Medical Status is: Inpatient Remains inpatient appropriate because: Continues to improve and needs SNF with Insurance Auth   Consultants:  Gastroenterology  Procedures:  CT head CT C-spine 02/12/2022 Chest x-ray 02/12/2022 Transfusion 2 units packed red blood cells 02/13/2022  EGD Findings:      The examined esophagus was normal.  Multiple small sessile polyps were found in the gastric fundus and in       the gastric body. One 4 mm polyp had active oozing. While the more       likely culprit bleeding lesion was the sphincterotomy site ulcer below,       given the active oozing, anemia, and  need to eventually restart       anticoagulation, the polyp was removed with a cold snare then treated       with a single hemostatic clip with closure and cessation of bleeding.       Resection and retrieval were complete. There was no bleeding at the end       of the procedure.      Mild inflammation characterized by congestion (edema) and erythema was       found in the gastric fundus and in the gastric body. This was previously       biopsied and benign.      One non-bleeding duodenal ulcer with a visible vessel was found at the       major papilla. The lesion was 3 mm in largest dimension. Area was       successfully injected with 3 mL of a 1:10,000 solution of epinephrine       for hemostasis with appropriate mucosal blanching. Coagulation for       hemostasis using bicap heater probe was successful.      A non-bleeding diverticulum was found in the second portion of the       duodenum. This was lavaged and had a clean base without stigmata of       bleeding. A pancreatic stent was noted in position. The remainder of the       visualized duodenum was normal appearing. Impression:               - Normal esophagus.                           - Multiple gastric polyps. One was actively oozing                            upon endoscope insertion. Resected with a cold                            snare then clip closed with one hemostatic clip.                           - Gastritis.                           - Non-bleeding duodenal ulcer with a visible vessel                            located at the major papilla. Injected with                            epinephrine then treated with a bicap heater probe.                           - Non-bleeding duodenal diverticulum. Recommendation:           -  Return patient to hospital ward for ongoing care.                           - Full liquid diet today, and if no sign of                            rebleeding, can advance diet as tolerated  tomorrow.                           - Continue serial Hgb/Hct checks.                           - Continue holding Eliquis today. Will engage with                            the Hospitalist service regarding prolonged hold to                            allow continued mucosal healing.                           - Continue other medications.                           - Await pathology results.                           - Based on appearance and positioning, suspect the                            pancreatic stent will appropriately fall out in the                            next couple of days. Will obtain KUB prior to                            discharge to confirm.                           - GI service will continue to follow.  Antimicrobials:  Anti-infectives (From admission, onward)    None       Subjective: Seen and examined at bedside and sitting in the chair working with therapy. Asking for Diet to be advanced as he is tired of potato soup. No nausea or vomiting. Hgb/Hct is relatively stable. Still unsteady on his feet. Being checked for Hypoxia.  Objective: Vitals:   02/14/22 2013 02/15/22 0508 02/15/22 0742 02/15/22 0754  BP: (!) 155/63 137/61  134/64  Pulse: 61 60  (!) 59  Resp: 18 20    Temp: 98.4 F (36.9 C) 98.4 F (36.9 C)  98.2 F (36.8 C)  TempSrc: Axillary Axillary  Oral  SpO2: 100% 100% 100% 97%  Weight:      Height:        Intake/Output Summary (Last 24 hours) at 02/15/2022 1040 Last data filed at 02/15/2022 0600 Gross per 24 hour  Intake --  Output 1200 ml  Net -  1200 ml   Filed Weights   02/12/22 1315  Weight: 87.5 kg   Examination: Physical Exam:  Constitutional: WN/WD overweight elderly Caucasian male in NAD Respiratory: Diminished to auscultation bilaterally with coarse breath sounds and some slight crackles and wheezing, no rales, rhonchi. Normal respiratory effort and patient is not tachypenic. No accessory muscle use. Unlabored breathing.   Cardiovascular: RRR, no murmurs / rubs / gallops. S1 and S2 auscultated. Mild 1+ extremity edema.  Abdomen: Soft, non-tender, Distended 2/2 body habitus. Bowel sounds positive.  GU: Deferred. Musculoskeletal: No clubbing / cyanosis of digits/nails. No joint deformity upper and lower extremities. Neurologic: CN 2-12 grossly intact with no focal deficits. Romberg sign and cerebellar reflexes not assessed.  Psychiatric: Normal judgment and insight. Alert and oriented x 3. Normal mood and appropriate affect.   Data Reviewed: I have personally reviewed following labs and imaging studies  CBC: Recent Labs  Lab 02/12/22 1320 02/12/22 1721 02/13/22 0236 02/13/22 1248 02/13/22 1638 02/14/22 0512 02/15/22 0302  WBC 7.3  --  6.9  --  6.9 8.0 7.1  NEUTROABS 6.3  --   --   --   --   --  6.1  HGB 8.6*   < > 7.5* 7.4* 6.7* 9.6* 9.3*  HCT 26.3*  --  22.6* 22.6* 21.4* 28.6* 28.7*  MCV 96.3  --  93.8  --  96.4 88.8 91.1  PLT 254  --  214  --  227 245 227   < > = values in this interval not displayed.   Basic Metabolic Panel: Recent Labs  Lab 02/12/22 1320 02/13/22 0236 02/14/22 0512 02/15/22 0302  NA 137 137 139 137  K 4.4 3.7 4.4 3.3*  CL 105 106 109 104  CO2 20* 22 22 23   GLUCOSE 216* 129* 137* 147*  BUN 46* 36* 21 18  CREATININE 1.83* 1.44* 1.34* 1.36*  CALCIUM 8.6* 8.4* 8.7* 8.5*  MG  --   --  2.2 2.1  PHOS  --   --  3.9 4.0   GFR: Estimated Creatinine Clearance: 46.8 mL/min (A) (by C-G formula based on SCr of 1.36 mg/dL (H)). Liver Function Tests: Recent Labs  Lab 02/12/22 1320 02/14/22 0512 02/15/22 0302  AST 28  --  17  ALT 19  --  20  ALKPHOS 84  --  78  BILITOT 1.0  --  1.0  PROT 5.5*  --  5.5*  ALBUMIN 3.1* 3.0* 3.1*   No results for input(s): LIPASE, AMYLASE in the last 168 hours. No results for input(s): AMMONIA in the last 168 hours. Coagulation Profile: No results for input(s): INR, PROTIME in the last 168 hours. Cardiac Enzymes: No results for input(s):  CKTOTAL, CKMB, CKMBINDEX, TROPONINI in the last 168 hours. BNP (last 3 results) No results for input(s): PROBNP in the last 8760 hours. HbA1C: No results for input(s): HGBA1C in the last 72 hours. CBG: No results for input(s): GLUCAP in the last 168 hours. Lipid Profile: No results for input(s): CHOL, HDL, LDLCALC, TRIG, CHOLHDL, LDLDIRECT in the last 72 hours. Thyroid Function Tests: No results for input(s): TSH, T4TOTAL, FREET4, T3FREE, THYROIDAB in the last 72 hours. Anemia Panel: Recent Labs    02/12/22 1721  VITAMINB12 166*  FOLATE 12.2  FERRITIN 85  TIBC 322  IRON 62  RETICCTPCT 4.9*   Sepsis Labs: No results for input(s): PROCALCITON, LATICACIDVEN in the last 168 hours.  No results found for this or any previous visit (from the past 240 hour(s)).  Radiology Studies: DG CHEST PORT 1 VIEW  Result Date: 02/14/2022 CLINICAL DATA:  Trauma, fall EXAM: PORTABLE CHEST 1 VIEW COMPARISON:  Previous studies including the examination of 02/12/2022 FINDINGS: Transverse diameter of heart is increased. Central pulmonary vessels are more prominent. Increased interstitial markings are seen in the parahilar regions and lower lung fields. There is blunting of right lateral CP angle. Right lung is smaller than left. There is asymmetric pleural thickening in the right apex. IMPRESSION: Central pulmonary vessels are more prominent suggesting possible CHF. Increased interstitial markings are seen in the parahilar regions and lower lung fields suggesting interstitial pulmonary edema or multifocal pneumonia. Right pleural effusion. Electronically Signed   By: Elmer Picker M.D.   On: 02/14/2022 15:36    Scheduled Meds:  arformoterol  15 mcg Nebulization BID   And   umeclidinium bromide  1 puff Inhalation Daily   brinzolamide  1 drop Both Eyes TID   And   brimonidine  1 drop Both Eyes TID   budesonide  0.25 mg Nebulization BID   cyanocobalamin  1,000 mcg Subcutaneous Daily   escitalopram   10 mg Oral Daily   furosemide  20 mg Intravenous Once   furosemide  60 mg Oral BID   icosapent Ethyl  2 g Oral BID   latanoprost  1 drop Both Eyes QHS   melatonin  20 mg Oral QHS   metoprolol succinate  12.5 mg Oral QHS   montelukast  10 mg Oral QHS   pantoprazole  40 mg Oral BID   potassium chloride  40 mEq Oral BID   rosuvastatin  40 mg Oral Daily   Continuous Infusions:   LOS: 2 days   Raiford Noble, DO [facility] Available via Epic secure chat 7am-7pm After these hours, please refer to coverage provider listed on [email or web address] 02/15/2022, 10:40 AM

## 2022-02-15 NOTE — Evaluation (Signed)
Occupational Therapy Evaluation Patient Details Name: Troy Haskin [name] MRN: [id number] DOB: 08-20-1941 Today's Date: 02/15/2022   History of Present Illness Pt is an 81 y/o male admitted secondary to fall, SOB, and GI bleeding. Pt is s/p EGD and found bleeding gastric polyp. PMH includes colon/lung cacner, DM, COPD, glaucoma, a fib, AAA, and dCHF.   Clinical Impression   PTA, pt lives alone with frequent caregiver support during the day and is alone at night. Pt reports typically Independent with ADLs and mobility with assist for IADLs due to baseline visual deficits. Pt presents now with deficits in cognition, standing balance, strength and endurance. Pt requires Min-Mod A for basic transfers using RW requiring increased cues to locate chair, etc due to legally blind. Pt requires Min A for UB ADL and Max A for LB ADLs. Pt SOB with activity (questionable desat to 88%) but recovers well with seated rest break. Based on high fall risk and current deficits, recommend ST [facility] at [facility]. Pt appears in agreement with this recommendation, citing he "will do what he has to". Will continue to follow acutely.       Recommendations for follow up therapy are one component of a multi-disciplinary discharge planning process, led by the attending physician.  Recommendations may be updated based on patient status, additional functional criteria and insurance authorization.   Follow Up Recommendations  Skilled nursing-short term rehab (<3 hours/day)    Assistance Recommended at Discharge Frequent or constant Supervision/Assistance  Patient can return home with the following A lot of help with walking and/or transfers;A lot of help with bathing/dressing/bathroom    Functional Status Assessment  Patient has had a recent decline in their functional status and demonstrates the ability to make significant improvements in function in a reasonable and predictable amount of time.  Equipment Recommendations   Other (comment) (Rolling walker)    Recommendations for Other Services       Precautions / Restrictions Precautions Precautions: Fall Precaution Comments: legally blind, monitor O2/orthostatics Restrictions Weight Bearing Restrictions: No      Mobility Bed Mobility Overal bed mobility: Needs Assistance Bed Mobility: Supine to Sit     Supine to sit: Min assist, HOB elevated     General bed mobility comments: handheld assist to lift trunk though pt pulling to assist significant amount    Transfers Overall transfer level: Needs assistance Equipment used: Rolling walker (2 wheels) Transfers: Sit to/from Stand, Bed to chair/wheelchair/BSC Sit to Stand: Min assist     Step pivot transfers: Mod assist     General transfer comment: Min A to stand with pt pulling on RW, Mod A to step to recliner with assist to manuever DME, maintain balance and cues for environmental setup d/t low vision      Balance Overall balance assessment: Needs assistance Sitting-balance support: No upper extremity supported, Feet supported Sitting balance-Leahy Scale: Fair     Standing balance support: Bilateral upper extremity supported Standing balance-Leahy Scale: Poor                             ADL either performed or assessed with clinical judgement   ADL Overall ADL's : Needs assistance/impaired Eating/Feeding: Supervision/ safety;Sitting Eating/Feeding Details (indicate cue type and reason): requires cues for where items are located d/t low vision Grooming: Minimal assistance;Standing   Upper Body Bathing: Minimal assistance;Sitting   Lower Body Bathing: Maximal assistance;Sit to/from stand   Upper Body Dressing : Minimal assistance;Sitting  Lower Body Dressing: Maximal assistance;Sit to/from stand Lower Body Dressing Details (indicate cue type and reason): able to bend to reach B feet and cross LEs with increased time though some difficulty fully reaching. will need  increased assist due to low vision and balance deficits in standing Toilet Transfer: Minimal assistance;Stand-pivot;Rolling walker (2 wheels);BSC/3in1   Toileting- Clothing Manipulation and Hygiene: Maximal assistance;Sit to/from stand;Sitting/lateral lean         General ADL Comments: Limited by balance deficits but also baseline visual deficits in new environment hindering independence     Vision Baseline Vision/History: 2 Legally blind;1 Wears glasses Ability to See in Adequate Light: 3 Highly impaired Patient Visual Report: No change from baseline Vision Assessment?: Vision impaired- to be further tested in functional context Additional Comments: per daughter, pt is legally blind. pt reports R eye completely blind and able to see blurry outlines of items usually. says bright lights hinder eyesight     Perception     Praxis      Pertinent Vitals/Pain Pain Assessment Pain Assessment: No/denies pain     Hand Dominance Right   Extremity/Trunk Assessment Upper Extremity Assessment Upper Extremity Assessment: Generalized weakness   Lower Extremity Assessment Lower Extremity Assessment: Defer to PT evaluation   Cervical / Trunk Assessment Cervical / Trunk Assessment: Kyphotic   Communication Communication Communication: No difficulties   Cognition Arousal/Alertness: Awake/alert Behavior During Therapy: WFL for tasks assessed/performed, Impulsive Overall Cognitive Status: Impaired/Different from baseline Area of Impairment: Safety/judgement, Attention, Memory, Awareness, Problem solving                   Current Attention Level: Selective Memory: Decreased short-term memory   Safety/Judgement: Decreased awareness of deficits, Decreased awareness of safety Awareness: Emergent Problem Solving: Requires verbal cues, Requires tactile cues, Difficulty sequencing General Comments: Pt with very poor safety awareness and awareness of deficits. difficulty recalling  marriage anniversary - unable to report appropriate year for this but does accurately say this year is 2023. benefits from cues for sequencing though requires increased cues due to low vision (where items are, etc)     General Comments  variable BP readings with initial low reading sitting EOB (90s/60s) though increased back to 140s/70s with activity. minor dizziness reported initially. SpO2 questionable desat to 88%, sustained >92% on RA. SOB with activity but recovered once in chair, left on RA. Daughter intermittently present    Exercises     Shoulder Instructions      Home Living Family/patient expects to be discharged to:: Private residence Living Arrangements: Alone Available Help at Discharge: Personal care attendant;Family;Available PRN/intermittently (caregiver during the day; alone at night) Type of Home: House Home Access: Stairs to enter [employer] of Steps: 1+1 Entrance Stairs-Rails: Right Home Layout: One level     Bathroom Shower/Tub: Occupational psychologist: Handicapped height     Home Equipment: Grab bars - tub/shower;Shower seat   Additional Comments: Caregivers 5-7 days/week, 9-2 on sat, and sun; 9-2 and 4-8 M-F      Prior Functioning/Environment Prior Level of Function : Needs assist             Mobility Comments: Has rollator that he uses occasionally. Reports he normally is independent without device. reports one recent fall with syncopal episode ADLs Comments: Caregiver assists with meds, assists with cooking, driving, grocery shopping, laundry due to pt low vision. Assists with picking clothes out, but pt able to dress himself, bathe self and toilet without assist  OT Problem List: Decreased strength;Decreased activity tolerance;Impaired balance (sitting and/or standing);Decreased cognition;Decreased safety awareness;Decreased knowledge of use of DME or AE;Cardiopulmonary status limiting activity      OT  Treatment/Interventions: Self-care/ADL training;Therapeutic exercise;Energy conservation;DME and/or AE instruction;Therapeutic activities;Patient/family education;Balance training    OT Goals(Current goals can be found in the care plan section) Acute Rehab OT Goals Patient Stated Goal: agreeable to rehab OT Goal Formulation: With patient/family Time For Goal Achievement: 03/01/22 Potential to Achieve Goals: Good  OT Frequency: Min 2X/week    Co-evaluation              AM-PAC OT "6 Clicks" Daily Activity     Outcome Measure Help from another person eating meals?: A Little Help from another person taking care of personal grooming?: A Little Help from another person toileting, which includes using toliet, bedpan, or urinal?: A Lot Help from another person bathing (including washing, rinsing, drying)?: A Lot Help from another person to put on and taking off regular upper body clothing?: A Little Help from another person to put on and taking off regular lower body clothing?: A Lot 6 Click Score: 15   End of Session Equipment Utilized During Treatment: Rolling walker (2 wheels);Gait belt;Oxygen Nurse Communication: Mobility status;Other (comment) (assist with ordering meals)  Activity Tolerance: Patient tolerated treatment well Patient left: in chair;with call bell/phone within reach;with chair alarm set;with family/visitor present  OT Visit Diagnosis: Other abnormalities of gait and mobility (R26.89);Unsteadiness on feet (R26.81);Muscle weakness (generalized) (M62.81)                Time: 1001-1037 OT Time Calculation (min): 36 min Charges:  OT General Charges $OT Visit: 1 Visit OT Evaluation $OT Eval Moderate Complexity: 1 Mod OT Treatments $Self Care/Home Management : 8-22 mins  Malachy Chamber, OTR/L Acute Rehab Services Office: [phone]   Layla Maw 02/15/2022, 12:26 PM

## 2022-02-15 NOTE — Hospital Course (Addendum)
Patient is an 81 year old gentleman history of AAA, A-fib on chronic anticoagulation with Eliquis, BPH, chronic diastolic CHF, CKD stage IIIb, glaucoma with severe visual impairment, colon/lung cancer, COPD, hypertension, hyperlipidemia, PVD status post stents presented with a fall.  Patient noted to have had an ERCP done on 02/06/2022 and noted that sedation made him constipated.  Patient noted he was having stools that was soft and dark.  Patient noted to have been discharged home feeling like hell and had been in bed since then initially getting over anesthesia and then generalized weakness.  Patient with weakness, dizziness with decreased appetite.  Patient noted on day of admission presented to go to the bathroom got lightheaded, woozy fell in the bathroom denies any loss of consciousness.  Hit his forehead and had a few minor scrapes.  Patient seen in the ED noted to have a hemoglobin of 8 from 13, positive melena, placed on PPI, GI consulted.  Patient admitted, seen by GI underwent upper endoscopy 02/13/2022.  Hemoglobin continued to drop and patient transfused 2 units packed red blood cells.  **Interim History Blood Count stablized and PT/OT evaluated and recommending SNF. Diet has been advanced and will add Eliquis back later today per GI recc's. Patient appeared a little volume overloaded so he was given some IV Lasix with improvement. His Hypoxia is improving and now doing well. Medically stable for D/C and will go to SNF. Will need to follow up with PCP, Cardiology, and Gastroenterology.

## 2022-02-15 NOTE — TOC Progression Note (Addendum)
Transition of Care (TOC) - Progression Note    Patient Details  Name: Troy Schwenke [name] MRN: [id number] Date of Birth: 1941/01/01  Transition of Care [facility]) CM/SW Contact  Troy Chars, Troy [name] Phone Number: 02/15/2022, 10:16 AM  Clinical Narrative:  Bed offers presented to pt daughter Troy [name] in the room.  She provided contact at Troy [name], [phone], who Troy [name] spoke to and is reviewing for possible admission there.  CSW LM with Troy [name] and Troy [name] at [place].  SNF auth request initiated with Troy [name] at [facility].   1330; Auth approved by [facility]: [id number], PTAR [id number].  CSW spoke with pt and daughter, still no response from [facility].  CSW called and left another message with Troy [name] and Troy [name] at [place].  No response.    1555: CSW spoke with son Troy [name], he reports that Troy [name] was able to speak with Troy [name] at [facility] in the last hour and was told they might be able to take pt for rehab. CSW called reception at [facility] twice, asked to speak to a person, was put in several more voicemails.  Per Troy [name], they do want to choose Troy [name] if [facility] [name] is not an option.    1605: TC Troy [name]/Troy [name].  Rehab is for residents only, do not have bed availability to accept pts on the wait list.    Expected Discharge Plan: [place] Barriers to Discharge: Continued Medical Work up, SNF Pending bed offer  Expected Discharge Plan and Services Expected Discharge Plan: [place] In-house Referral: Clinical Social Work   Post Acute Care Choice: [place] Living arrangements for the past 2 months: [place] Determinants of Health (SDOH) Interventions    Readmission Risk Interventions    07/04/2021    3:08 PM 07/04/2021    1:14 PM  Readmission Risk Prevention Plan  Transportation Screening Complete Complete  Medication Review (RN Care Manager) Referral to Pharmacy Referral to  Pharmacy  PCP or Specialist appointment within 3-5 days of discharge Complete Complete  HRI or Home Care Consult Complete Complete  SW Recovery Care/Counseling Consult Complete   Palliative Care Screening Not [place] Patient Refused

## 2022-02-16 ENCOUNTER — Inpatient Hospital Stay (HOSPITAL_COMMUNITY): Payer: PPO

## 2022-02-16 DIAGNOSIS — R58 Hemorrhage, not elsewhere classified: Secondary | ICD-10-CM | POA: Diagnosis not present

## 2022-02-16 DIAGNOSIS — E785 Hyperlipidemia, unspecified: Secondary | ICD-10-CM | POA: Diagnosis not present

## 2022-02-16 DIAGNOSIS — H409 Unspecified glaucoma: Secondary | ICD-10-CM | POA: Diagnosis not present

## 2022-02-16 DIAGNOSIS — M6281 Muscle weakness (generalized): Secondary | ICD-10-CM | POA: Diagnosis not present

## 2022-02-16 DIAGNOSIS — K317 Polyp of stomach and duodenum: Secondary | ICD-10-CM | POA: Diagnosis not present

## 2022-02-16 DIAGNOSIS — E1351 Other specified diabetes mellitus with diabetic peripheral angiopathy without gangrene: Secondary | ICD-10-CM | POA: Diagnosis not present

## 2022-02-16 DIAGNOSIS — Y92009 Unspecified place in unspecified non-institutional (private) residence as the place of occurrence of the external cause: Secondary | ICD-10-CM | POA: Diagnosis not present

## 2022-02-16 DIAGNOSIS — W19XXXA Unspecified fall, initial encounter: Secondary | ICD-10-CM | POA: Diagnosis not present

## 2022-02-16 DIAGNOSIS — I4891 Unspecified atrial fibrillation: Secondary | ICD-10-CM | POA: Diagnosis not present

## 2022-02-16 DIAGNOSIS — R41841 Cognitive communication deficit: Secondary | ICD-10-CM | POA: Diagnosis not present

## 2022-02-16 DIAGNOSIS — D649 Anemia, unspecified: Secondary | ICD-10-CM | POA: Diagnosis not present

## 2022-02-16 DIAGNOSIS — R269 Unspecified abnormalities of gait and mobility: Secondary | ICD-10-CM | POA: Diagnosis not present

## 2022-02-16 DIAGNOSIS — S0181XS Laceration without foreign body of other part of head, sequela: Secondary | ICD-10-CM | POA: Diagnosis not present

## 2022-02-16 DIAGNOSIS — I10 Essential (primary) hypertension: Secondary | ICD-10-CM | POA: Diagnosis not present

## 2022-02-16 DIAGNOSIS — R531 Weakness: Secondary | ICD-10-CM | POA: Diagnosis not present

## 2022-02-16 DIAGNOSIS — J9601 Acute respiratory failure with hypoxia: Secondary | ICD-10-CM | POA: Diagnosis not present

## 2022-02-16 DIAGNOSIS — N183 Chronic kidney disease, stage 3 unspecified: Secondary | ICD-10-CM | POA: Diagnosis not present

## 2022-02-16 DIAGNOSIS — R2689 Other abnormalities of gait and mobility: Secondary | ICD-10-CM | POA: Diagnosis not present

## 2022-02-16 DIAGNOSIS — K269 Duodenal ulcer, unspecified as acute or chronic, without hemorrhage or perforation: Secondary | ICD-10-CM | POA: Diagnosis not present

## 2022-02-16 DIAGNOSIS — I714 Abdominal aortic aneurysm, without rupture, unspecified: Secondary | ICD-10-CM | POA: Diagnosis not present

## 2022-02-16 DIAGNOSIS — J9811 Atelectasis: Secondary | ICD-10-CM | POA: Diagnosis not present

## 2022-02-16 DIAGNOSIS — C3490 Malignant neoplasm of unspecified part of unspecified bronchus or lung: Secondary | ICD-10-CM | POA: Diagnosis not present

## 2022-02-16 DIAGNOSIS — I480 Paroxysmal atrial fibrillation: Secondary | ICD-10-CM | POA: Diagnosis not present

## 2022-02-16 DIAGNOSIS — D62 Acute posthemorrhagic anemia: Secondary | ICD-10-CM | POA: Diagnosis not present

## 2022-02-16 DIAGNOSIS — E119 Type 2 diabetes mellitus without complications: Secondary | ICD-10-CM | POA: Diagnosis not present

## 2022-02-16 DIAGNOSIS — M419 Scoliosis, unspecified: Secondary | ICD-10-CM | POA: Diagnosis not present

## 2022-02-16 DIAGNOSIS — R0602 Shortness of breath: Secondary | ICD-10-CM | POA: Diagnosis not present

## 2022-02-16 DIAGNOSIS — Z9689 Presence of other specified functional implants: Secondary | ICD-10-CM | POA: Diagnosis not present

## 2022-02-16 DIAGNOSIS — R262 Difficulty in walking, not elsewhere classified: Secondary | ICD-10-CM | POA: Diagnosis not present

## 2022-02-16 DIAGNOSIS — N1831 Chronic kidney disease, stage 3a: Secondary | ICD-10-CM | POA: Diagnosis not present

## 2022-02-16 DIAGNOSIS — N1832 Chronic kidney disease, stage 3b: Secondary | ICD-10-CM | POA: Diagnosis not present

## 2022-02-16 DIAGNOSIS — E1122 Type 2 diabetes mellitus with diabetic chronic kidney disease: Secondary | ICD-10-CM | POA: Diagnosis not present

## 2022-02-16 DIAGNOSIS — R195 Other fecal abnormalities: Secondary | ICD-10-CM | POA: Diagnosis not present

## 2022-02-16 DIAGNOSIS — K922 Gastrointestinal hemorrhage, unspecified: Secondary | ICD-10-CM | POA: Diagnosis not present

## 2022-02-16 DIAGNOSIS — Z743 Need for continuous supervision: Secondary | ICD-10-CM | POA: Diagnosis not present

## 2022-02-16 DIAGNOSIS — Z23 Encounter for immunization: Secondary | ICD-10-CM | POA: Diagnosis not present

## 2022-02-16 DIAGNOSIS — I739 Peripheral vascular disease, unspecified: Secondary | ICD-10-CM | POA: Diagnosis not present

## 2022-02-16 DIAGNOSIS — E538 Deficiency of other specified B group vitamins: Secondary | ICD-10-CM | POA: Diagnosis not present

## 2022-02-16 DIAGNOSIS — J90 Pleural effusion, not elsewhere classified: Secondary | ICD-10-CM | POA: Diagnosis not present

## 2022-02-16 DIAGNOSIS — J449 Chronic obstructive pulmonary disease, unspecified: Secondary | ICD-10-CM | POA: Diagnosis not present

## 2022-02-16 DIAGNOSIS — I5030 Unspecified diastolic (congestive) heart failure: Secondary | ICD-10-CM | POA: Diagnosis not present

## 2022-02-16 DIAGNOSIS — R2681 Unsteadiness on feet: Secondary | ICD-10-CM | POA: Diagnosis not present

## 2022-02-16 LAB — PHOSPHORUS: Phosphorus: 3.5 mg/dL (ref 2.5–4.6)

## 2022-02-16 LAB — COMPREHENSIVE METABOLIC PANEL
ALT: 21 U/L (ref 0–44)
AST: 22 U/L (ref 15–41)
Albumin: 3.1 g/dL — ABNORMAL LOW (ref 3.5–5.0)
Alkaline Phosphatase: 78 U/L (ref 38–126)
Anion gap: 10 (ref 5–15)
BUN: 21 mg/dL (ref 8–23)
CO2: 24 mmol/L (ref 22–32)
Calcium: 8.6 mg/dL — ABNORMAL LOW (ref 8.9–10.3)
Chloride: 104 mmol/L (ref 98–111)
Creatinine, Ser: 1.49 mg/dL — ABNORMAL HIGH (ref 0.61–1.24)
GFR, Estimated: 47 mL/min — ABNORMAL LOW (ref >60)
Glucose, Bld: 155 mg/dL — ABNORMAL HIGH (ref 70–99)
Potassium: 3.2 mmol/L — ABNORMAL LOW (ref 3.5–5.1)
Sodium: 138 mmol/L (ref 135–145)
Total Bilirubin: 1 mg/dL (ref 0.3–1.2)
Total Protein: 5.9 g/dL — ABNORMAL LOW (ref 6.5–8.1)

## 2022-02-16 LAB — CBC WITH DIFFERENTIAL/PLATELET
Abs Immature Granulocytes: 0.03 10*3/uL (ref 0.00–0.07)
Basophils Absolute: 0 10*3/uL (ref 0.0–0.1)
Basophils Relative: 0 %
Eosinophils Absolute: 0.2 10*3/uL (ref 0.0–0.5)
Eosinophils Relative: 3 %
HCT: 28.7 % — ABNORMAL LOW (ref 39.0–52.0)
Hemoglobin: 9.3 g/dL — ABNORMAL LOW (ref 13.0–17.0)
Immature Granulocytes: 1 %
Lymphocytes Relative: 12 %
Lymphs Abs: 0.8 10*3/uL (ref 0.7–4.0)
MCH: 29.7 pg (ref 26.0–34.0)
MCHC: 32.4 g/dL (ref 30.0–36.0)
MCV: 91.7 fL (ref 80.0–100.0)
Monocytes Absolute: 0.3 10*3/uL (ref 0.1–1.0)
Monocytes Relative: 5 %
Neutro Abs: 5 10*3/uL (ref 1.7–7.7)
Neutrophils Relative %: 79 %
Platelets: 246 10*3/uL (ref 150–400)
RBC: 3.13 MIL/uL — ABNORMAL LOW (ref 4.22–5.81)
RDW: 22.6 % — ABNORMAL HIGH (ref 11.5–15.5)
WBC: 6.3 10*3/uL (ref 4.0–10.5)
nRBC: 0 % (ref 0.0–0.2)

## 2022-02-16 LAB — MAGNESIUM: Magnesium: 2.2 mg/dL (ref 1.7–2.4)

## 2022-02-16 MED ORDER — POTASSIUM CHLORIDE CRYS ER 20 MEQ PO TBCR
40.0000 meq | EXTENDED_RELEASE_TABLET | Freq: Two times a day (BID) | ORAL | Status: DC
  Administered 2022-02-16: 40 meq via ORAL
  Filled 2022-02-16: qty 2

## 2022-02-16 MED ORDER — VITAMIN B-12 1000 MCG PO TABS: 1000.0000 ug | ORAL_TABLET | Freq: Every day | ORAL | 0 refills | Status: DC

## 2022-02-16 MED ORDER — PANTOPRAZOLE SODIUM 40 MG PO TBEC
40.0000 mg | DELAYED_RELEASE_TABLET | Freq: Two times a day (BID) | ORAL | 0 refills | Status: DC
Start: 2022-02-16 — End: 2022-07-31

## 2022-02-16 MED ORDER — ONDANSETRON HCL 4 MG PO TABS: 4.0000 mg | ORAL_TABLET | Freq: Four times a day (QID) | ORAL | 0 refills | Status: DC | PRN

## 2022-02-16 MED ORDER — APIXABAN 5 MG PO TABS
5.0000 mg | ORAL_TABLET | Freq: Two times a day (BID) | ORAL | Status: DC
Start: 2022-02-16 — End: 2022-02-16

## 2022-02-16 NOTE — TOC Transition Note (Signed)
Transition of Care [facility]) - CM/SW Discharge Note   Patient Details  Name: Troy Swatzell [name] MRN: [id number] Date of Birth: Mar 29, 1941  Transition of Care [facility]) CM/SW Contact:  Joanne Chars, LCSW Phone Number: 02/16/2022, 12:54 PM   Clinical Narrative:   Pt discharging to [facility].  RN call report to [phone].     Final next level of care: Skilled Nursing Facility Barriers to Discharge: Barriers Resolved   Patient Goals and CMS Choice Patient states their goals for this hospitalization and ongoing recovery are:: get back home CMS Medicare.gov Compare Post Acute Care list provided to:: Patient Represenative (must comment) Choice offered to / list presented to : Adult Children (son)  Discharge Placement              Patient chooses bed at: [facility] Patient to be transferred to facility by: [place] Name of family member notified: daughter [name] Patient and family notified of of transfer: 02/16/22  Discharge Plan and Services In-house Referral: Clinical Social Work   Post Acute Care Choice: [place]                               Social Determinants of Health (SDOH) Interventions     Readmission Risk Interventions    07/04/2021    3:08 PM 07/04/2021    1:14 PM  Readmission Risk Prevention Plan  Transportation Screening Complete Complete  Medication Review (RN Care Manager) Referral to Pharmacy Referral to Pharmacy  PCP or Specialist appointment within 3-5 days of discharge Complete Complete  HRI or [place] Complete Complete  SW Recovery Care/Counseling Consult Complete   [place] Patient Refused

## 2022-02-16 NOTE — Care Management Important Message (Signed)
Important Message  Patient Details  Name: Troy Adriano [name] MRN: [id number] Date of Birth: 08-24-1941   Medicare Important Message Given:  Yes     Haddon Fyfe 02/16/2022, 4:10 PM

## 2022-02-16 NOTE — TOC Progression Note (Signed)
Transition of Care (TOC) - Progression Note    Patient Details  Name: Crews Mccollam [name] MRN: [id number] Date of Birth: Sep 28, 1941  Transition of Care [facility]) CM/SW Contact  Joanne Chars, LCSW Phone Number: 02/16/2022, 9:47 AM  Clinical Narrative:   CSW updated daughter [name] that [facility] does not have bed, she would like to move forward with [place].  CSW confirmed bed available with [facility].  CSW LM with [facility] that [facility] is facility choice.    Expected Discharge Plan: [place] Barriers to Discharge: Continued Medical Work up, SNF Pending bed offer  Expected Discharge Plan and Services Expected Discharge Plan: [place] In-house Referral: Clinical Social Work   Post Acute Care Choice: [place] Living arrangements for the past 2 months: [place] Determinants of Health (SDOH) Interventions    Readmission Risk Interventions    07/04/2021    3:08 PM 07/04/2021    1:14 PM  Readmission Risk Prevention Plan  Transportation Screening Complete Complete  Medication Review (RN Care Manager) Referral to Pharmacy Referral to Pharmacy  PCP or Specialist appointment within 3-5 days of discharge Complete Complete  HRI or [place] Complete Complete  SW Recovery Care/Counseling Consult Complete   [place] Patient Refused

## 2022-02-16 NOTE — Progress Notes (Signed)
Mobility Specialist Progress Note   02/16/22 1233  Mobility  Activity Ambulated with assistance in hallway  Level of Assistance Minimal assist, patient does 75% or more  Assistive Device Front wheel walker  Distance Ambulated (ft) 80 ft  Activity Response Tolerated well  $Mobility charge 1 Mobility   Pre Mobility: HR,  150/60 BP, 96% SpO2 During Mobility: 89 HR, 79% - 84% SpO2 Post Mobility: 66 HR, 161/65 BP, SpO2  Session limited today by SpO2 levels. Received in bed having no current c/o and agreeable. Requiring minA for UE to bring self to EOB. Able to stand and walk w/ contact guard but requiring mod cues for step by step direction d/t vision impairment. Pt having "shuffle-like" gait while ambulating but no faults in mobility . X2 standing rest breaks d/t slight SOB, SpO2 levels declined to 79% - 84% after second break. Practiced PLB but SpO2 wasn't rising, decided to head back to room for safety. Pt requesting to ambulate farther and stating to be feeling fine besides slight SOB. Returned to the chair, placed call bell in reach and chair alarm on.   Told pt I will return in the afternoon to do more ambulation if time permits w/ O2 tank.  [place] Falling Mobility Specialist Phone Number [phone]

## 2022-02-19 ENCOUNTER — Non-Acute Institutional Stay (SKILLED_NURSING_FACILITY): Payer: PPO | Admitting: Adult Health

## 2022-02-19 ENCOUNTER — Telehealth: Payer: Self-pay

## 2022-02-19 ENCOUNTER — Other Ambulatory Visit: Payer: Self-pay

## 2022-02-19 ENCOUNTER — Encounter: Payer: Self-pay | Admitting: Adult Health

## 2022-02-19 DIAGNOSIS — E538 Deficiency of other specified B group vitamins: Secondary | ICD-10-CM

## 2022-02-19 DIAGNOSIS — N1831 Chronic kidney disease, stage 3a: Secondary | ICD-10-CM | POA: Diagnosis not present

## 2022-02-19 DIAGNOSIS — R531 Weakness: Secondary | ICD-10-CM | POA: Diagnosis not present

## 2022-02-19 DIAGNOSIS — E785 Hyperlipidemia, unspecified: Secondary | ICD-10-CM

## 2022-02-19 DIAGNOSIS — K922 Gastrointestinal hemorrhage, unspecified: Secondary | ICD-10-CM | POA: Diagnosis not present

## 2022-02-19 DIAGNOSIS — D649 Anemia, unspecified: Secondary | ICD-10-CM

## 2022-02-19 DIAGNOSIS — I10 Essential (primary) hypertension: Secondary | ICD-10-CM | POA: Diagnosis not present

## 2022-02-19 DIAGNOSIS — I480 Paroxysmal atrial fibrillation: Secondary | ICD-10-CM | POA: Diagnosis not present

## 2022-02-19 DIAGNOSIS — H409 Unspecified glaucoma: Secondary | ICD-10-CM

## 2022-02-19 DIAGNOSIS — E1122 Type 2 diabetes mellitus with diabetic chronic kidney disease: Secondary | ICD-10-CM | POA: Diagnosis not present

## 2022-02-19 DIAGNOSIS — R195 Other fecal abnormalities: Secondary | ICD-10-CM

## 2022-02-19 NOTE — Progress Notes (Unsigned)
Location:  [place] Room Number: [place] of Service:  SNF (31) Provider:  Durenda Age, DNP, FNP-BC  Patient Care Team: Binnie Rail, MD as PCP - General (Internal Medicine) Thompson Grayer, MD as PCP - Electrophysiology (Cardiology) Lerry Paterson, MD as Referring Physician (Thoracic Surgery) Larey Dresser, MD as Consulting Physician (Cardiology) Susa Day, MD as Consulting Physician (Orthopedic Surgery) Ladell Pier, MD as Consulting Physician (Oncology) Bond, Tracie Harrier, MD as Referring Physician (Ophthalmology) Charlton Haws, [facility] as Pharmacist (Pharmacist)  Extended Emergency Contact Information Primary Emergency Contact: [facility] Phone: [phone] Relation: Friend Secondary Emergency Contact: [facility] Phone: [phone] Mobile Phone: [phone] Relation: Daughter  Code Status:  DNR  Goals of care: Advanced Directive information    02/19/2022    9:49 AM  Advanced Directives  Does Patient Have a Medical Advance Directive? Yes  Type of Advance Directive Living will;Out of facility DNR (pink MOST or yellow form)  Does patient want to make changes to medical advance directive? No - Patient declined     Chief Complaint  Patient presents with   Hospitalization Follow-up    Follow up hospitalization 5/15 to 02/16/22     HPI:  Pt is a 81 y.o. Troy [name] who was admitted to [place] post hospital admission 5/15 to 02/16/22.  He has a PMH of AAA, A-fib on chronic anticoagulation with Eliquis, BPH, chronic diastolic CHF, CKD stage IIIb, glaucoma with severe visual impairment, colon/lung cancer, COPD, hypertension, hyperlipidemia and PVD status post stents he fell at home and presented to the hospital.  Of note, patient had an ERCP done on 02/06/2022 and noted that sedation made him constipated.  He noted that he was having stools that was soft and dark.  He was discharged  home feeling like hell and had been in bed since then due to generalized weakness.  He had been having dizziness with decreased appetite.  On the day of admission to the hospital, he got to the bathroom, got lightheaded and fell in the bathroom.  He denies losing consciousness.  He hit his forehead sustaining a minor abrasion.  In the ED, he had hemoglobin of 8, dropped from 13, positive melena and was placed on PPI.  GI was consulted and underwent upper endoscopy on 02/13/2022.  Hemoglobin continued to drop and was transfused 2 units PRBC.  Upper endoscopy showed multiple gastric polyps, 1 active bleeding oozing status post resection, nonbleeding duodenal ulcer with visible vessel noted status post epinephrine and cautery nonbleeding duodenal diverticulum.  He was seen in his room today with daughter at bedside.  Past Medical History:  Diagnosis Date   AAA (abdominal aortic aneurysm) ([place]) LAST ABDOMINAL US 10-20-17 3.3 CM   MONITORED BY DR [name]   Anxiety    Arthritis    Atrial fibrillation (HCC)    Basal cell carcinoma    BPH (benign prostatic hypertrophy)    Chronic diastolic heart failure (HCC)    Chronic kidney disease    stage 3, pt unaware   Colon cancer (HCC)    COPD (chronic obstructive pulmonary disease) (HCC)    Depression    GERD (gastroesophageal reflux disease)    Glaucoma BOTH EYES   Dr Gershon Crane   History of lung cancer APRIL 2012  SQUAMOUS CELL---- S/P RIGHT LOWER LOBECTOMY AT [facility] --  NO CHEMORADIATION---  NO RECURRENCE    ONCOLOGIST- DR Tressie Stalker  LOV IN [facility] 10-27-2012   History of pneumothorax  pt unaware   Hx of adenomatous colonic polyps 2005    X 2; 1 hyperplastic polyp; Dr Olevia Perches   Hyperlipidemia    Hypertension    Impaired fasting glucose 2007   108; A1c5.4%   OSA (obstructive sleep apnea) 08/29/2015   CPAP SET ON 10   Peripheral vascular disease (HCC) S/P ANGIOPLASTY AND STENTING   FOLLOWED  BY DR DICKSON   Spinal stenosis 06/2014   Status post  placement of implantable loop recorder    Thoracic aorta atherosclerosis (HCC)    Thyrotoxicosis    amiodarone induced   Past Surgical History:  Procedure Laterality Date   ANGIO PLASTY     X 4 in legs   AORTOGRAM  07-27-2002   MILD DIFFUSE ILIAC ARTERY OCCLUSIVE DISEASE /  LEFT RENAL ARTERY 20%/ PATENT LEFT FEM-POP GRAFT/ MILD SFA AND POPLITEAL ARTERY OCCLUSIVE DISEASE W/ SEVERE KIDNEY OCCLUSIVE DISEASE   ATRIAL FIBRILLATION ABLATION N/A 04/09/2019   Procedure: ATRIAL FIBRILLATION ABLATION;  Surgeon: Thompson Grayer, MD;  Location: [place] CV LAB;  Service: Cardiovascular;  Laterality: N/A;   ATRIAL FIBRILLATION ABLATION N/A 02/12/2020   Procedure: ATRIAL FIBRILLATION ABLATION;  Surgeon: Thompson Grayer, MD;  Location: [name] CV LAB;  Service: Cardiovascular;  Laterality: N/A;   BALLOON DILATION N/A 02/06/2022   Procedure: BALLOON Tamponade ampulla;  Surgeon: Irving Copas., MD;  Location: [name];  Service: Gastroenterology;  Laterality: N/A;   BASAL CELL CARCINOMA EXCISION     MULTIPLE TIMES--  RIGHT FOREARM, CHEEKS, AND BACK   BIOPSY  06/25/2018   Procedure: BIOPSY;  Surgeon: Rush Landmark Telford Nab., MD;  Location: [place];  Service: Gastroenterology;;   BIOPSY  02/06/2022   Procedure: BIOPSY;  Surgeon: Irving Copas., MD;  Location: [facility];  Service: Gastroenterology;;   CARDIOVASCULAR STRESS TEST  12-08-2012  DR [facility]   NORMAL LEXISCAN WITH NO EXERCISE NUCLEAR STUDY/ EF 66%/   NO ISCHEMIA/ NO SIGNIFICANT CHANGE FROM PRIOR STUDY   CARDIOVERSION N/A 02/14/2018   Procedure: CARDIOVERSION;  Surgeon: Larey Dresser, MD;  Location: [place];  Service: Cardiovascular;  Laterality: N/A;   CAROTID ANGIOGRAM N/A 07/10/2013   Procedure: CAROTID ANGIOGRAM;  Surgeon: Elam Dutch, MD;  Location: [facility] CATH LAB;  Service: Cardiovascular;  Laterality: N/A;   CAROTID ENDARTERECTOMY Right 07-14-13   cea   CATARACT EXTRACTION W/ INTRAOCULAR LENS  IMPLANT,  BILATERAL     colon polyectomy     COLONOSCOPY     COLONOSCOPY WITH PROPOFOL N/A 06/25/2018   Procedure: COLONOSCOPY WITH PROPOFOL;  Surgeon: Rush Landmark Telford Nab., MD;  Location: [place];  Service: Gastroenterology;  Laterality: N/A;   CYSTOSCOPY W/ RETROGRADES Bilateral 01/21/2013   Procedure: CYSTOSCOPY WITH RETROGRADE PYELOGRAM;  Surgeon: Molli Hazard, MD;  Location: [facility];  Service: Urology;  Laterality: Bilateral;   CYSTO, BLADDER BIOPSY, BILATERAL RETROGRADE PYELOGRAM  RAD TECH FROM RADIOLOGY PER JOY   CYSTOSCOPY WITH BIOPSY N/A 01/21/2013   Procedure: CYSTOSCOPY WITH BIOPSY;  Surgeon: Molli Hazard, MD;  Location[name];  Service: Urology;  Laterality: N/A;   ENDARTERECTOMY Right 07/14/2013   Procedure: ENDARTERECTOMY CAROTID;  Surgeon: Angelia Mould, MD;  Location: [facility] OR;  Service: Vascular;  Laterality: Right;   ENDOSCOPIC RETROGRADE CHOLANGIOPANCREATOGRAPHY (ERCP) WITH PROPOFOL N/A 02/06/2022   Procedure: ENDOSCOPIC RETROGRADE CHOLANGIOPANCREATOGRAPHY (ERCP) WITH PROPOFOL;  Surgeon: Irving Copas., MD;  Location: [name];  Service: Gastroenterology;  Laterality: N/A;   EP IMPLANTABLE DEVICE N/A 08/12/2015   Procedure:  Loop Recorder Insertion;  Surgeon: Thompson Grayer, MD;  Location: [place] CV LAB;  Service: Cardiovascular;  Laterality: N/A;   ESOPHAGOGASTRODUODENOSCOPY Left 02/13/2022   Procedure: ESOPHAGOGASTRODUODENOSCOPY (EGD);  Surgeon: Lavena Bullion, DO;  Location: [facility] ENDOSCOPY;  Service: Gastroenterology;  Laterality: Left;   ESOPHAGOGASTRODUODENOSCOPY (EGD) WITH PROPOFOL N/A 06/25/2018   Procedure: ESOPHAGOGASTRODUODENOSCOPY (EGD) WITH PROPOFOL;  Surgeon: Rush Landmark Telford Nab., MD;  Location: [place];  Service: Gastroenterology;  Laterality: N/A;   ESOPHAGOGASTRODUODENOSCOPY (EGD) WITH PROPOFOL N/A 07/10/2018   Procedure: ESOPHAGOGASTRODUODENOSCOPY (EGD) WITH PROPOFOL;  Surgeon:  Milus Banister, MD;  [facility];  Service: Endoscopy;  Laterality: N/A;   EUS N/A 07/10/2018   Procedure: UPPER ENDOSCOPIC ULTRASOUND (EUS) RADIAL;  Surgeon: Milus Banister, MD;  [facility];  Service: Endoscopy;  Laterality: N/A;   EYE SURGERY Right    FEMORAL-POPLITEAL BYPASS GRAFT Left [place]   AND 2001  IN [place]   FEMORAL-POPLITEAL BYPASS GRAFT Left 08/30/2015   Procedure: REVISION OF BYPASS GRAFT Left  FEMORAL-POPLITEAL ARTERY;  Surgeon: Angelia Mould, MD;  Location: [place];  Service: Vascular;  Laterality: Left;   FINE NEEDLE ASPIRATION N/A 07/10/2018   Procedure: FINE NEEDLE ASPIRATION (FNA) LINEAR;  Surgeon: Milus Banister, MD;  [facility];  Service: Endoscopy;  Laterality: N/A;   FLEXIBLE SIGMOIDOSCOPY N/A 12/12/2018   Procedure: FLEXIBLE SIGMOIDOSCOPY;  Surgeon: Ileana Roup, MD;  [facility];  Service: General;  Laterality: N/A;   HEMOSTASIS CLIP PLACEMENT  02/13/2022   Procedure: HEMOSTASIS CLIP PLACEMENT;  Surgeon: Lavena Bullion, DO;  Location: [place];  Service: Gastroenterology;;   HOT HEMOSTASIS N/A 02/13/2022   Procedure: HOT HEMOSTASIS (ARGON PLASMA COAGULATION/BICAP);  Surgeon: Lavena Bullion, DO;  Location: [facility] ENDOSCOPY;  Service: Gastroenterology;  Laterality: N/A;   LARYNGOSCOPY  06-27-2004   BX VOCAL CORD  (LEUKOPLAKIA)  PER PT NO ISSUES SINCE   LEFT HEART CATH AND CORONARY ANGIOGRAPHY N/A 08/20/2017   Procedure: LEFT HEART CATH AND CORONARY ANGIOGRAPHY;  Surgeon: Larey Dresser, MD;  Location: [place] CV LAB;  Service: Cardiovascular;  Laterality: N/A;   LOOP RECORDER INSERTION N/A 04/09/2019   Procedure: LOOP RECORDER INSERTION;  Surgeon: Thompson Grayer, MD;  Location: [place] CV LAB;  Service: Cardiovascular;  Laterality: N/A;   LOOP RECORDER REMOVAL N/A 04/09/2019   Procedure: LOOP RECORDER REMOVAL;  Surgeon: Thompson Grayer, MD;  Location: [place] CV LAB;  Service:  Cardiovascular;  Laterality: N/A;   LOWER EXTREMITY ANGIOGRAM Bilateral 08/29/2015   Procedure: Lower Extremity Angiogram;  Surgeon: Angelia Mould, MD;  Location: [place] [facility] LAB;  Service: Cardiovascular;  Laterality: Bilateral;   LUNG LOBECTOMY  01/24/2011    RIGHT UPPER LOBE  (SQUAMOUS CELL CARCINOMA) Dr Dorthea Cove , [facility]. No chemotherapyor radiation   PANCREATIC STENT PLACEMENT  02/06/2022   Procedure: PANCREATIC STENT PLACEMENT;  Surgeon: Rush Landmark Telford Nab., MD;  Location: [name];  Service: Gastroenterology;;   [facility] ANGIOPLASTY Right 07/14/2013   Procedure: PATCH ANGIOPLASTY;  Surgeon: Angelia Mould, MD;  Location: [place];  Service: Vascular;  Laterality: Right;   PATCH ANGIOPLASTY Left 08/30/2015   Procedure: VEIN PATCH ANGIOPLASTY OF PROXIMAL Left BYPASS GRAFT;  Surgeon: Angelia Mould, MD;  Location: [place];  Service: Vascular;  Laterality: Left;   PERIPHERAL VASCULAR CATHETERIZATION N/A 08/29/2015   Procedure: Abdominal Aortogram;  Surgeon: Angelia Mould, MD;  Location: [place] CV LAB;  Service: Cardiovascular;  Laterality: N/A;   POLYPECTOMY  06/25/2018  Procedure: POLYPECTOMY;  Surgeon: Rush Landmark Telford Nab., MD;  Location: [place];  Service: Gastroenterology;;   POLYPECTOMY     POLYPECTOMY  02/13/2022   Procedure: POLYPECTOMY;  Surgeon: Lavena Bullion, DO;  Location: [place];  Service: Gastroenterology;;   REMOVAL OF STONES  02/06/2022   Procedure: REMOVAL OF STONES;  Surgeon: Irving Copas., MD;  Location[name];  Service: Gastroenterology;;   Clide Deutscher  02/13/2022   Procedure: Clide Deutscher;  Surgeon: Lavena Bullion, DO;  Location: [place];  Service: Gastroenterology;;   Joan Mayans  02/06/2022   Procedure: Joan Mayans;  Surgeon: Irving Copas., MD;  Location: [name];  Service: Gastroenterology;;   SUBMUCOSAL INJECTION  06/25/2018   Procedure: SUBMUCOSAL INJECTION;  Surgeon:  Irving Copas., MD;  Location: [place];  Service: Gastroenterology;;   TEE WITHOUT CARDIOVERSION N/A 02/14/2018   Procedure: TRANSESOPHAGEAL ECHOCARDIOGRAM (TEE);  Surgeon: Larey Dresser, MD;  Location: [facility] ENDOSCOPY;  Service: Cardiovascular;  Laterality: N/A;   trabecular surgery     OS   TRANSTHORACIC ECHOCARDIOGRAM  12-29-2012  DR [facility]   MILD LVH/  LVSF NORMAL/ EF 29-56%/  GRADE I DIASTOLIC DYSFUNCTION    Allergies  Allergen Reactions   Niacin-Lovastatin Er Shortness Of Breath and Other (See Comments)    Dyspnea, flushing   Penicillins Hives, Shortness Of Breath and Other (See Comments)    Flushing  & dyspnea Because of a history of documented adverse serious drug reaction;Medi Alert bracelet  is recommended PCN reaction causing immediate rash, facial/tongue/throat swelling, SOB or lightheadedness with hypotension: Yes PCN reaction causing severe rash involving mucus membranes or skin necrosis: No PCN reaction occurring within the last 10 years: NO PCN reaction that required hospitalization: NO   Sulfonamide Derivatives Hives, Shortness Of Breath and Other (See Comments)    Flushing & dyspnea Because of a history of documented adverse serious drug reaction;Medi Alert bracelet  is recommended   Atorvastatin Other (See Comments)    Myalgias & athralgias- takes Crestor, DOES NOT LIKE LIPITOR    Outpatient Encounter Medications as of 02/19/2022  Medication Sig   acetaminophen (TYLENOL) 325 MG tablet Take 650 mg by mouth every 6 (six) hours as needed for moderate pain or headache.   albuterol (VENTOLIN HFA) 108 (90 Base) MCG/ACT inhaler Inhale 2 puffs into the lungs every 6 (six) hours as needed for wheezing or shortness of breath.   amLODipine (NORVASC) 5 MG tablet TAKE ONE TABLET BY MOUTH EVERY DAY   apixaban (ELIQUIS) 5 MG TABS tablet Take 1 tablet (5 mg total) by mouth 2 (two) times daily.   Brinzolamide-Brimonidine (SIMBRINZA) 1-0.2 % SUSP Apply 1 drop to eye in  the morning, at noon, and at bedtime.   Budeson-Glycopyrrol-Formoterol (BREZTRI AEROSPHERE) 160-9-4.8 MCG/ACT AERO Inhale 2 puffs into the lungs in the morning and at bedtime.   docusate sodium (COLACE) 100 MG capsule Take 100 mg by mouth 2 (two) times daily.    escitalopram (LEXAPRO) 10 MG tablet TAKE ONE TABLET BY MOUTH EVERY DAY   furosemide (LASIX) 20 MG tablet Take 3 tablets (60 mg total) by mouth 2 (two) times daily.   JARDIANCE 10 MG TABS tablet TAKE ONE TABLET BY MOUTH EVERY DAY BEFORE BREAKFAST.   latanoprost (XALATAN) 0.005 % ophthalmic solution Place 1 drop into both eyes at bedtime.   losartan (COZAAR) 25 MG tablet TAKE ONE TABLET BY MOUTH EVERY DAY   Melatonin 10 MG TABS Take 20 mg by mouth at bedtime.   metoprolol succinate (TOPROL-XL) 25 MG  24 hr tablet TAKE 1/2 TABLET EVERY EVENING WITH OR IMMEDIATELY FOLLOWING A MEAL   montelukast (SINGULAIR) 10 MG tablet TAKE ONE TABLET AT BEDTIME   Multiple Vitamins-Minerals (CENTRUM SILVER PO) Take 1 tablet by mouth daily.   ondansetron (ZOFRAN) 4 MG tablet Take 1 tablet (4 mg total) by mouth every 6 (six) hours as needed for nausea.   OVER THE COUNTER MEDICATION Take 3 capsules by mouth daily. Balance of Nature Fruits   OVER THE COUNTER MEDICATION Take 3 capsules by mouth daily. Balance of Nature Vegetables   pantoprazole (PROTONIX) 40 MG tablet Take 1 tablet (40 mg total) by mouth 2 (two) times daily.   potassium chloride SA (KLOR-CON M) 20 MEQ tablet Take 2 tablets (40 mEq total) by mouth every morning AND 1 tablet (20 mEq total) every evening.   rosuvastatin (CRESTOR) 40 MG tablet TAKE ONE TABLET BY MOUTH EVERY DAY   senna (SENOKOT) 8.6 MG tablet Take 1 tablet by mouth 2 (two) times daily.   vitamin B-12 (CYANOCOBALAMIN) 1000 MCG tablet Take 1 tablet (1,000 mcg total) by mouth daily.   [DISCONTINUED] VASCEPA 1 g capsule Take 2 capsules (2 g total) by mouth 2 (two) times daily.   No facility-administered encounter medications on file as  of 02/19/2022.    Review of Systems  Constitutional:  Negative for activity change, appetite change and fever.  HENT:  Negative for sore throat.   Eyes: Negative.   Cardiovascular:  Negative for chest pain and leg swelling.  Gastrointestinal:  Negative for abdominal distention, diarrhea and vomiting.  Genitourinary:  Negative for dysuria, frequency and urgency.  Skin:  Negative for color change.  Neurological:  Negative for dizziness and headaches.  Psychiatric/Behavioral:  Negative for behavioral problems and sleep disturbance. The patient is not nervous/anxious.       Immunization History  Administered Date(s) Administered   Fluad Quad(high Dose 65+) 08/11/2020   Influenza Split 07/05/2017   Influenza Whole 07/28/2010   Influenza,inj,Quad PF,6+ Mos 07/07/2013, 07/30/2014   Influenza-Unspecified 07/02/2015, 07/01/2021   PFIZER(Purple Top)SARS-COV-2 Vaccination 10/18/2019, 11/08/2019, 06/14/2020, 04/14/2021   Pneumococcal Conjugate-13 11/28/2015   Pneumococcal Polysaccharide-23 10/02/2007   Td 03/14/2010   Tdap 02/12/2022   Pertinent  Health Maintenance Due  Topic Date Due   FOOT EXAM  Never done   OPHTHALMOLOGY EXAM  Never done   INFLUENZA VACCINE  05/01/2022   HEMOGLOBIN A1C  07/12/2022   COLONOSCOPY (Pts 45-25yrs Insurance coverage will need to be confirmed)  06/24/2023      02/13/2022   10:00 PM 02/14/2022    8:15 AM 02/14/2022    7:20 PM 02/15/2022    7:16 AM 02/15/2022    8:20 PM  Fall Risk  Patient Fall Risk Level High fall risk High fall risk High fall risk High fall risk High fall risk     Vitals:   02/19/22 0950  BP: (!) 144/72  Pulse: 61  Temp: 97.6 F (36.4 C)  Weight: 193 lb (87.5 kg)  Height: 6' (1.829 m)   Body mass index is 26.18 kg/m.  Physical Exam Constitutional:      Appearance: Normal appearance.  HENT:     Head: Normocephalic and atraumatic.     Mouth/Throat:     Mouth: Mucous membranes are moist.  Eyes:     Conjunctiva/sclera:  Conjunctivae normal.     Comments: Right eye legally blind and left eye with limited vision.  Cardiovascular:     Rate and Rhythm: Normal rate and regular rhythm.  Pulses: Normal pulses.     Heart sounds: Normal heart sounds.  Pulmonary:     Effort: Pulmonary effort is normal.     Breath sounds: Normal breath sounds.  Abdominal:     General: Bowel sounds are normal.     Palpations: Abdomen is soft.  Musculoskeletal:        General: No swelling. Normal range of motion.     Cervical back: Normal range of motion.  Skin:    General: Skin is warm and dry.  Neurological:     General: No focal deficit present.     Mental Status: He is alert and oriented to person, place, and time.  Psychiatric:        Mood and Affect: Mood normal.        Behavior: Behavior normal.        Thought Content: Thought content normal.        Judgment: Judgment normal.       Labs reviewed: Recent Labs    02/14/22 0512 02/15/22 0302 02/16/22 0141  NA 139 137 138  K 4.4 3.3* 3.2*  CL 109 104 104  CO2 22 23 24   GLUCOSE 137* 147* 155*  BUN 21 18 21   CREATININE 1.34* 1.36* 1.49*  CALCIUM 8.7* 8.5* 8.6*  MG 2.2 2.1 2.2  PHOS 3.9 4.0 3.5   Recent Labs    02/12/22 1320 02/14/22 0512 02/15/22 0302 02/16/22 0141  AST 28  --  17 22  ALT 19  --  20 21  ALKPHOS 84  --  78 78  BILITOT 1.0  --  1.0 1.0  PROT 5.5*  --  5.5* 5.9*  ALBUMIN 3.1* 3.0* 3.1* 3.1*   Recent Labs    02/12/22 1320 02/12/22 1721 02/14/22 0512 02/15/22 0302 02/16/22 0141  WBC 7.3   < > 8.0 7.1 6.3  NEUTROABS 6.3  --   --  6.1 5.0  HGB 8.6*   < > 9.6* 9.3* 9.3*  HCT 26.3*   < > 28.6* 28.7* 28.7*  MCV 96.3   < > 88.8 91.1 91.7  PLT 254   < > 245 227 246   < > = values in this interval not displayed.   Lab Results  Component Value Date   TSH 1.69 01/10/2022   Lab Results  Component Value Date   HGBA1C 6.7 (H) 01/10/2022   Lab Results  Component Value Date   CHOL 80 10/24/2021   HDL 31 (L) 10/24/2021    LDLCALC 34 10/24/2021   LDLDIRECT 41.0 07/11/2021   TRIG 74 10/24/2021   CHOLHDL 2.6 10/24/2021    Significant Diagnostic Results in last 30 days:  CT Head Wo Contrast  Result Date: 02/12/2022 CLINICAL DATA:  Fall, head and neck trauma EXAM: CT HEAD WITHOUT CONTRAST CT CERVICAL SPINE WITHOUT CONTRAST TECHNIQUE: Multidetector CT imaging of the head and cervical spine was performed following the standard protocol without intravenous contrast. Multiplanar CT image reconstructions of the cervical spine were also generated. RADIATION DOSE REDUCTION: This exam was performed according to the departmental dose-optimization program which includes automated exposure control, adjustment of the mA and/or kV according to patient size and/or use of iterative reconstruction technique. COMPARISON:  None Available. CT head dated June 23, 2018 FINDINGS: CT HEAD FINDINGS Brain: No evidence of acute infarction, hemorrhage, hydrocephalus, extra-axial collection or mass lesion/mass effect. Mild cerebral atrophy and chronic microvascular ischemic changes of the white matter. Vascular: No hyperdense vessel or unexpected calcification. Skull: Normal. Negative for fracture  or focal lesion. Sinuses/Orbits: No acute finding.  Bilateral cataract surgery. Other: None. CT CERVICAL SPINE FINDINGS Alignment: Straightening of the cervical spine. Skull base and vertebrae: No acute fracture. No primary bone lesion or focal pathologic process. Soft tissues and spinal canal: No prevertebral fluid or swelling. No visible canal hematoma. Disc levels: Multilevel degenerate disc disease with disc space loss and osteophytes prominent at C4-C5, C5-C6 and C6-C7. Mild moderate multilevel uncovertebral joint and facet joint arthropathy. Moderate left neural foraminal stenosis at C4-C5 and moderate neural foraminal stenosis at C5-C6. Upper chest: No acute abnormality. Other: None IMPRESSION: 1. No acute intracranial abnormality. 2. Mild cerebral  atrophy and chronic microvascular ischemic changes of the white matter. 3. No acute cervical spine fracture or traumatic subluxation. 4. Moderate multilevel degenerate disc disease prominent at C4-C5 and C5-C6. Uncovertebral joint and facet joint arthropathy with neural foraminal stenosis prominent at C5-C6. Electronically Signed   By: Keane Police D.O.   On: 02/12/2022 13:55   CT Cervical Spine Wo Contrast  Result Date: 02/12/2022 CLINICAL DATA:  Fall, head and neck trauma EXAM: CT HEAD WITHOUT CONTRAST CT CERVICAL SPINE WITHOUT CONTRAST TECHNIQUE: Multidetector CT imaging of the head and cervical spine was performed following the standard protocol without intravenous contrast. Multiplanar CT image reconstructions of the cervical spine were also generated. RADIATION DOSE REDUCTION: This exam was performed according to the departmental dose-optimization program which includes automated exposure control, adjustment of the mA and/or kV according to patient size and/or use of iterative reconstruction technique. COMPARISON:  None Available. CT head dated June 23, 2018 FINDINGS: CT HEAD FINDINGS Brain: No evidence of acute infarction, hemorrhage, hydrocephalus, extra-axial collection or mass lesion/mass effect. Mild cerebral atrophy and chronic microvascular ischemic changes of the white matter. Vascular: No hyperdense vessel or unexpected calcification. Skull: Normal. Negative for fracture or focal lesion. Sinuses/Orbits: No acute finding.  Bilateral cataract surgery. Other: None. CT CERVICAL SPINE FINDINGS Alignment: Straightening of the cervical spine. Skull base and vertebrae: No acute fracture. No primary bone lesion or focal pathologic process. Soft tissues and spinal canal: No prevertebral fluid or swelling. No visible canal hematoma. Disc levels: Multilevel degenerate disc disease with disc space loss and osteophytes prominent at C4-C5, C5-C6 and C6-C7. Mild moderate multilevel uncovertebral joint and  facet joint arthropathy. Moderate left neural foraminal stenosis at C4-C5 and moderate neural foraminal stenosis at C5-C6. Upper chest: No acute abnormality. Other: None IMPRESSION: 1. No acute intracranial abnormality. 2. Mild cerebral atrophy and chronic microvascular ischemic changes of the white matter. 3. No acute cervical spine fracture or traumatic subluxation. 4. Moderate multilevel degenerate disc disease prominent at C4-C5 and C5-C6. Uncovertebral joint and facet joint arthropathy with neural foraminal stenosis prominent at C5-C6. Electronically Signed   By: Keane Police D.O.   On: 02/12/2022 13:55   DG CHEST PORT 1 VIEW  Result Date: 02/16/2022 CLINICAL DATA:  Shortness of breath. EXAM: PORTABLE CHEST 1 VIEW COMPARISON:  Feb 14, 2022. FINDINGS: The heart size and mediastinal contours are within normal limits. Left lung is clear. Mild right basilar atelectasis is noted with small right pleural effusion. The visualized skeletal structures are unremarkable. IMPRESSION: Mild right basilar atelectasis is noted with small right pleural effusion. Electronically Signed   By: Marijo Conception M.D.   On: 02/16/2022 09:44   DG CHEST PORT 1 VIEW  Result Date: 02/14/2022 CLINICAL DATA:  Trauma, fall EXAM: PORTABLE CHEST 1 VIEW COMPARISON:  Previous studies including the examination of 02/12/2022 FINDINGS: Transverse diameter of heart  is increased. Central pulmonary vessels are more prominent. Increased interstitial markings are seen in the parahilar regions and lower lung fields. There is blunting of right lateral CP angle. Right lung is smaller than left. There is asymmetric pleural thickening in the right apex. IMPRESSION: Central pulmonary vessels are more prominent suggesting possible CHF. Increased interstitial markings are seen in the parahilar regions and lower lung fields suggesting interstitial pulmonary edema or multifocal pneumonia. Right pleural effusion. Electronically Signed   By: Elmer Picker M.D.   On: 02/14/2022 15:36   DG Chest Portable 1 View  Result Date: 02/12/2022 CLINICAL DATA:  Syncope, fall, initial encounter. EXAM: PORTABLE CHEST 1 VIEW COMPARISON:  CT chest 09/12/2021 and chest radiograph 10/24/2021. FINDINGS: Trachea is midline. Heart size stable. Loop recorder projects over the left heart. Moderate right pleural effusion appears partially loculated. Haziness in the right lung base. Findings appears similar to comparison exams. Left lung is clear. IMPRESSION: 1. Pleuroparenchymal scarring in the lower right hemithorax a moderate right fibrothorax. 2. No acute findings. Electronically Signed   By: Lorin Picket M.D.   On: 02/12/2022 13:32   DG ERCP  Result Date: 02/06/2022 CLINICAL DATA:  Evidence of choledocholithiasis by imaging. EXAM: ERCP TECHNIQUE: Multiple spot images obtained with the fluoroscopic device and submitted for interpretation post-procedure. COMPARISON:  MRI/MRCP on 01/18/2022 FINDINGS: Imaging with a C-arm during the endoscopic procedure demonstrates cannulation of the pancreatic duct, placement of small caliber pancreatic duct stent, cannulation of the common bile duct followed by balloon sweep maneuver to remove contents. Cholangiogram shows diffuse dilatation of the common bile duct. Post balloon sweep maneuver cholangiogram does not show convincing residual filling defect. IMPRESSION: Balloon sweep maneuvers performed to remove calculi from the common bile duct. These images were submitted for radiologic interpretation only. Please see the procedural report for the amount of contrast and the fluoroscopy time utilized. Electronically Signed   By: Aletta Edouard M.D.   On: 02/06/2022 11:53   DG C-Arm 1-60 Min-No Report  Result Date: 02/06/2022 Fluoroscopy was utilized by the requesting physician.  No radiographic interpretation.   CUP PACEART REMOTE DEVICE CHECK  Result Date: 01/24/2022 ILR summary report received. Battery status OK. Normal  device function. No new symptom, tachy, brady, or pause episodes. 1 new AF episodes, duration 47min, mean HR 37.  Burden 0.1%, Eliquis.  Monthly summary reports and ROV/PRN LA   Assessment/Plan  1. Upper GI bleed -    S/P transfusion of 2 units PRBC -    Continue pantoprazole 40 mg twice a day x8 weeks then 40 mg daily -   Follow-up with GI  2. Vitamin B12 deficiency Lab Results  Component Value Date   VITAMINB12 166 (L) 02/12/2022   -    Continue vitamin B12 injection  3. Benign essential HTN -Continue losartan and amlodipine  4. Generalized weakness -   For PT and OT, for therapeutic strengthening exercises  5. Hyperlipidemia, unspecified hyperlipidemia type Lab Results  Component Value Date   CHOL 80 10/24/2021   HDL 31 (L) 10/24/2021   LDLCALC 34 10/24/2021   LDLDIRECT 41.0 07/11/2021   TRIG 74 10/24/2021   CHOLHDL 2.6 10/24/2021   -   Continue rosuvastatin  6. Type 2 diabetes mellitus with stage 3a chronic kidney disease, without long-term current use of insulin (HCC) Lab Results  Component Value Date   HGBA1C 6.7 (H) 01/10/2022   -   Continue Jardiance  7. Glaucoma of both eyes, unspecified glaucoma type -Continue Simbrinza and  latanoprost eyedrops -   Requesting for self administration of eyedrops  8.  PAF -   Continue metoprolol succinate -   Eliquis was restarted on 5/18 per GI recommendation -    Follow-up with cardiology     Family/ staff Communication: Discussed plan of care with resident and charge nurse.  Labs/tests ordered:   For CBC and BMP    Durenda Age, DNP, MSN, FNP-BC [facility] [phone] (Monday-Friday 8:00 a.m. - 5:00 p.m.) [phone] (after hours)

## 2022-02-19 NOTE — Telephone Encounter (Signed)
Pt is in [facility] for rehab

## 2022-02-19 NOTE — Telephone Encounter (Signed)
Spoke with pt and let him know Dr. Bryan Lemma wanted him to have a repeat CBC in 7-10 days and follow up with Dr. Rush Landmark. Pt already scheduled for soonest available appt with Dr. Rush Landmark on 7/5. Pt verbalized understanding and had no other concerns at end of call. Order placed for CBC and reminder in epic.

## 2022-02-19 NOTE — Telephone Encounter (Signed)
-----   Message from [place], DO sent at 02/14/2022 12:12 PM EDT ----- I anticipate this patient will discharge home either today or tomorrow.  Can you please coordinate the following:  - Repeat CBC in 7-10 days.  Can place this under my name.  Please contact patient with date/time of when to go to the lab - Follow-up with Dr. Rush Landmark in about 4 weeks  Thank you

## 2022-02-20 ENCOUNTER — Non-Acute Institutional Stay (SKILLED_NURSING_FACILITY): Payer: PPO | Admitting: Internal Medicine

## 2022-02-20 ENCOUNTER — Telehealth: Payer: Self-pay

## 2022-02-20 DIAGNOSIS — R269 Unspecified abnormalities of gait and mobility: Secondary | ICD-10-CM

## 2022-02-20 DIAGNOSIS — E1351 Other specified diabetes mellitus with diabetic peripheral angiopathy without gangrene: Secondary | ICD-10-CM | POA: Diagnosis not present

## 2022-02-20 DIAGNOSIS — E538 Deficiency of other specified B group vitamins: Secondary | ICD-10-CM | POA: Diagnosis not present

## 2022-02-20 DIAGNOSIS — N1831 Chronic kidney disease, stage 3a: Secondary | ICD-10-CM | POA: Diagnosis not present

## 2022-02-20 DIAGNOSIS — I480 Paroxysmal atrial fibrillation: Secondary | ICD-10-CM | POA: Diagnosis not present

## 2022-02-20 DIAGNOSIS — I10 Essential (primary) hypertension: Secondary | ICD-10-CM

## 2022-02-20 DIAGNOSIS — D62 Acute posthemorrhagic anemia: Secondary | ICD-10-CM

## 2022-02-20 NOTE — Assessment & Plan Note (Addendum)
Multifactorial etiology with the greatest impact related to symptomatic anemia with hemoglobin as low as 6.7. PT/OT to assess the patient prior to discharge and discuss home needs with his daughter.  He does have a rollator, toilet seat, and shower bench at home.  Specifically need for home PT/OT will be assessed

## 2022-02-20 NOTE — Assessment & Plan Note (Signed)
Minimal elevation of systolic blood pressure.  No change as aggressive treatment of the blood pressure may exacerbate his balance and fall risk issues.

## 2022-02-20 NOTE — Telephone Encounter (Signed)
-----   Message from Timothy Lasso, RN sent at 02/06/2022  1:02 PM EDT ----- Make sure pt gets labs and KUB

## 2022-02-20 NOTE — Assessment & Plan Note (Addendum)
CBC will be checked today 5/23 prior to discharge as requested by his family.

## 2022-02-20 NOTE — Assessment & Plan Note (Signed)
Glucoses while hospitalized ranged from a low 129 up to high of 216.  On 01/10/2022 A1c was 6.7% indicating adequate control.  No change indicated.  It is critical to avoid hypoglycemia which would mimic TIAs.

## 2022-02-20 NOTE — Patient Instructions (Signed)
See assessment and plan under each diagnosis in the problem list and acutely for this visit 

## 2022-02-20 NOTE — Telephone Encounter (Signed)
The pt has been advised to come in for labs and Xray.  He states he will come in this week.

## 2022-02-20 NOTE — Progress Notes (Signed)
NURSING HOME LOCATION:  [facility] ROOM NUMBER:  [id number]  CODE STATUS:  DNR  PCP:  Billey Gosling MD   The patient is tentatively being discharged from [facility] on this date by [name] MD.  The medical history in this facility was reviewed and summarized and medical problem list was updated. Time spent and note content is documented as follows.  Summary of [facility] medical records: He was hospitalized 5/15 - 02/16/2022 after a fall in the context of significant anemia. He had an ERCP performed 5/9 and described constipation following the procedure.  He describes the stools as soft or loose as well as dark.  He made the comment that after he was discharged following the ERCP "felt like hell" and had essentially been bedridden since.  He describes weakness, anorexia, and dizziness.  On the day of admission he got lightheaded and woozy attempting to go to the bathroom.  He hit his head sustaining minor abrasions.  In the ED H/H was 8.6/26.3, down from prior H/H of 13.4/40.2 on 01/25/2022. FOBT was positive.  Placed on PPI and GI consulted.  Eliquis was held. EGD was performed 5/16 revealing revealed multiple gastric polyps, 1 actively oozing after resection as well as a nonbleeding duodenal ulcer with visible vessel for which epinephrine cautery performed.  Nonbleeding duodenal diverticulum also noted.   Anemia progressed with a nadir H/H of 6.7/21.4 necessitating the transfusion of 2 units of packed cells.  With transfusion anemia stabilized with final H/H of 9.3/28.7. Indices were normal despite a B12 level of 166 for which parenteral supplementation was initiated with subsequent transition to oral B12. AKI was present with a creatinine of 1.83 peak and nadir GFR 37 indicating stage IIIb.  At discharge value was 1.49 with a GFR of 47 indicating stage IIIa CKD. Total protein was 5.9 and albumin 3.1 indicating suboptimal nutrition.  While hospitalized glucoses  ranged from a low of 129 up to 216.  The most recent A1c on record was 6.7% on 01/10/2022 indicating good control.  TSH was therapeutic. Course was complicated by acute respiratory failure with hypoxia in the setting of COPD and chronic diastolic congestive heart failure.  3 L/min nasal oxygen required to keep O2 sats greater than 92%.  Clinically wheezing present on exam.  Pulmonary toilet with albuterol nebs every 6 hours was initiated as well is Brovana 15 mcg nebs twice daily as well as Incruse Ellipta daily.  Budesonide 0.25 mg nebs were continued twice a day with clinical improvement.  Review of systems: Pertinent or active symptoms include: He states that he is "pretty good" having significant improvement in his weakness and dizziness.  He continues to have intermittent shortness of breath in the context of COPD.  He states that the loose stools prior to the fall have resolved.  He also describes nocturia 6-7 times per night.  Negative ROS: Constitutional: No fever, significant weight change, fatigue  Eyes: No redness, discharge, pain, vision change ENT/mouth: No nasal congestion, purulent discharge, earache, change in hearing, sore throat  Cardiovascular: No chest pain, palpitations, paroxysmal nocturnal dyspnea, claudication, edema  Respiratory: No cough, sputum production, hemoptysis, DOE, significant snoring, apnea   Gastrointestinal: No heartburn, dysphagia, abdominal pain, nausea /vomiting, rectal bleeding, melena, change in bowels Genitourinary: No dysuria, hematuria, pyuria, incontinence, nocturia Musculoskeletal: No joint stiffness, joint swelling, weakness, pain Dermatologic: No rash, pruritus, change in appearance of skin Neurologic: No dizziness, headache, syncope, seizures, numbness, tingling Psychiatric: No significant anxiety, depression, insomnia,  anorexia Endocrine: No change in hair/skin/nails, excessive thirst, excessive hunger, excessive urination  Hematologic/lymphatic: No  significant bruising, lymphadenopathy, abnormal bleeding Allergy/immunology: No itchy/watery eyes, significant sneezing, urticaria, angioedema  Physical exam:  Pertinent or positive findings: Facies tend to be somewhat blank in the context of marked vision loss.  When I initially entered the room he did not recognize me even though I have been his primary care physician for decades.  There is slight ptosis on the left.  Rhythm is irregular but rate is controlled.  There is a slight bronchovesicular quality to the breath sounds but no abnormal breath sounds.  He has trace edema at the ankles.  Pedal pulses are decreased.  He has faint bruising over the distal forearms as well as a faint bruise over the mid forehead. General appearance: Adequately nourished; no acute distress, increased work of breathing is present.   Lymphatic: No lymphadenopathy about the head, neck, axilla. Eyes: No conjunctival inflammation or lid edema is present. There is no scleral icterus. Ears:  External ear exam shows no significant lesions or deformities.   Nose:  External nasal examination shows no deformity or inflammation. Nasal mucosa are pink and moist without lesions, exudates Oral exam: Lips and gums are healthy appearing.There is no oropharyngeal erythema or exudate. Neck:  No thyromegaly, masses, tenderness noted.    Heart:  Normal rate and regular rhythm. S1 and S2 normal without gallop, murmur, click, rub.  Lungs: Chest clear to auscultation without wheezes, rhonchi, rales, rubs. Abdomen: Bowel sounds are normal. Abdomen is soft and nontender with no organomegaly, hernias, masses. GU: Deferred as previously addressed. Extremities:  No cyanosis, clubbing, edema  Neurologic exam:  CN 2-7 intact Strength equal  in upper & lower extremities Balance, Rhomberg, finger to nose testing could not be completed due to clinical state Deep tendon reflexes are equal Skin: Warm & dry w/o tenting. No significant lesions or  rash.  See clinical summary of Discharge Diagnoses in the Problem List with associated updated therapeutic plan  Discharge instructions were written and discharge instructions provided. Follow-up will be by the primary care physician in seven - 14 days.

## 2022-02-20 NOTE — Assessment & Plan Note (Signed)
During the hospitalization for acute GI blood loss peak creatinine was 1.83 with a nadir GFR 37 indicating stage IIIb renal disease.  At discharge with resuscitation creatinine was 1.49 with GFR 47 indicating stage IIIa CKD.  Med list reviewed; no need for medication or dose adjustment unless there is progression of CKD.

## 2022-02-20 NOTE — Assessment & Plan Note (Signed)
Today rhythm is slightly irregular but rate is adequately controlled.

## 2022-02-21 ENCOUNTER — Encounter: Payer: Self-pay | Admitting: Internal Medicine

## 2022-02-22 ENCOUNTER — Ambulatory Visit (INDEPENDENT_AMBULATORY_CARE_PROVIDER_SITE_OTHER)
Admission: RE | Admit: 2022-02-22 | Discharge: 2022-02-22 | Disposition: A | Discharge status: Home / Self Care | Payer: PPO | Source: Ambulatory Visit | Attending: Gastroenterology | Admitting: Gastroenterology

## 2022-02-22 ENCOUNTER — Other Ambulatory Visit (INDEPENDENT_AMBULATORY_CARE_PROVIDER_SITE_OTHER): Payer: PPO

## 2022-02-22 DIAGNOSIS — R195 Other fecal abnormalities: Secondary | ICD-10-CM | POA: Diagnosis not present

## 2022-02-22 DIAGNOSIS — Z9689 Presence of other specified functional implants: Secondary | ICD-10-CM

## 2022-02-22 DIAGNOSIS — M419 Scoliosis, unspecified: Secondary | ICD-10-CM | POA: Diagnosis not present

## 2022-02-22 DIAGNOSIS — D649 Anemia, unspecified: Secondary | ICD-10-CM | POA: Diagnosis not present

## 2022-02-22 LAB — CBC WITH DIFFERENTIAL/PLATELET
Basophils Absolute: 0 10*3/uL (ref 0.0–0.1)
Basophils Relative: 0.8 % (ref 0.0–3.0)
Eosinophils Absolute: 0.2 10*3/uL (ref 0.0–0.7)
Eosinophils Relative: 3.1 % (ref 0.0–5.0)
HCT: 31.1 % — ABNORMAL LOW (ref 39.0–52.0)
Hemoglobin: 10.2 g/dL — ABNORMAL LOW (ref 13.0–17.0)
Lymphocytes Relative: 18.4 % (ref 12.0–46.0)
Lymphs Abs: 1.1 10*3/uL (ref 0.7–4.0)
MCHC: 32.9 g/dL (ref 30.0–36.0)
MCV: 88.9 fl (ref 78.0–100.0)
Monocytes Absolute: 0.4 10*3/uL (ref 0.1–1.0)
Monocytes Relative: 6.9 % (ref 3.0–12.0)
Neutro Abs: 4.2 10*3/uL (ref 1.4–7.7)
Neutrophils Relative %: 70.8 % (ref 43.0–77.0)
Platelets: 387 10*3/uL (ref 150.0–400.0)
RBC: 3.5 Mil/uL — ABNORMAL LOW (ref 4.22–5.81)
RDW: 21.1 % — ABNORMAL HIGH (ref 11.5–15.5)
WBC: 5.9 10*3/uL (ref 4.0–10.5)

## 2022-02-26 LAB — CUP PACEART REMOTE DEVICE CHECK
Date Time Interrogation Session: 20230528003140
Implantable Pulse Generator Implant Date: 20200709

## 2022-02-27 ENCOUNTER — Ambulatory Visit (INDEPENDENT_AMBULATORY_CARE_PROVIDER_SITE_OTHER): Payer: PPO

## 2022-02-27 DIAGNOSIS — I480 Paroxysmal atrial fibrillation: Secondary | ICD-10-CM

## 2022-03-01 ENCOUNTER — Other Ambulatory Visit: Payer: Self-pay

## 2022-03-01 DIAGNOSIS — Z8719 Personal history of other diseases of the digestive system: Secondary | ICD-10-CM

## 2022-03-01 DIAGNOSIS — D649 Anemia, unspecified: Secondary | ICD-10-CM

## 2022-03-01 DIAGNOSIS — Z9689 Presence of other specified functional implants: Secondary | ICD-10-CM

## 2022-03-02 DIAGNOSIS — I714 Abdominal aortic aneurysm, without rupture, unspecified: Secondary | ICD-10-CM | POA: Diagnosis not present

## 2022-03-02 DIAGNOSIS — I4891 Unspecified atrial fibrillation: Secondary | ICD-10-CM | POA: Diagnosis not present

## 2022-03-02 DIAGNOSIS — W19XXXA Unspecified fall, initial encounter: Secondary | ICD-10-CM | POA: Diagnosis not present

## 2022-03-02 DIAGNOSIS — I5030 Unspecified diastolic (congestive) heart failure: Secondary | ICD-10-CM | POA: Diagnosis not present

## 2022-03-05 DIAGNOSIS — I509 Heart failure, unspecified: Secondary | ICD-10-CM | POA: Diagnosis not present

## 2022-03-05 DIAGNOSIS — R2689 Other abnormalities of gait and mobility: Secondary | ICD-10-CM | POA: Diagnosis not present

## 2022-03-05 DIAGNOSIS — M6281 Muscle weakness (generalized): Secondary | ICD-10-CM | POA: Diagnosis not present

## 2022-03-05 DIAGNOSIS — Z8719 Personal history of other diseases of the digestive system: Secondary | ICD-10-CM | POA: Diagnosis not present

## 2022-03-07 ENCOUNTER — Telehealth: Payer: Self-pay | Admitting: Internal Medicine

## 2022-03-07 DIAGNOSIS — H547 Unspecified visual loss: Secondary | ICD-10-CM | POA: Diagnosis not present

## 2022-03-07 DIAGNOSIS — Z7984 Long term (current) use of oral hypoglycemic drugs: Secondary | ICD-10-CM | POA: Diagnosis not present

## 2022-03-07 DIAGNOSIS — I5032 Chronic diastolic (congestive) heart failure: Secondary | ICD-10-CM | POA: Diagnosis not present

## 2022-03-07 DIAGNOSIS — N1832 Chronic kidney disease, stage 3b: Secondary | ICD-10-CM | POA: Diagnosis not present

## 2022-03-07 DIAGNOSIS — I714 Abdominal aortic aneurysm, without rupture, unspecified: Secondary | ICD-10-CM | POA: Diagnosis not present

## 2022-03-07 DIAGNOSIS — G4733 Obstructive sleep apnea (adult) (pediatric): Secondary | ICD-10-CM | POA: Diagnosis not present

## 2022-03-07 DIAGNOSIS — F32A Depression, unspecified: Secondary | ICD-10-CM | POA: Diagnosis not present

## 2022-03-07 DIAGNOSIS — F419 Anxiety disorder, unspecified: Secondary | ICD-10-CM | POA: Diagnosis not present

## 2022-03-07 DIAGNOSIS — E1122 Type 2 diabetes mellitus with diabetic chronic kidney disease: Secondary | ICD-10-CM | POA: Diagnosis not present

## 2022-03-07 DIAGNOSIS — M4800 Spinal stenosis, site unspecified: Secondary | ICD-10-CM | POA: Diagnosis not present

## 2022-03-07 DIAGNOSIS — Z87891 Personal history of nicotine dependence: Secondary | ICD-10-CM | POA: Diagnosis not present

## 2022-03-07 DIAGNOSIS — Z9181 History of falling: Secondary | ICD-10-CM | POA: Diagnosis not present

## 2022-03-07 DIAGNOSIS — D62 Acute posthemorrhagic anemia: Secondary | ICD-10-CM | POA: Diagnosis not present

## 2022-03-07 DIAGNOSIS — F39 Unspecified mood [affective] disorder: Secondary | ICD-10-CM | POA: Diagnosis not present

## 2022-03-07 DIAGNOSIS — N400 Enlarged prostate without lower urinary tract symptoms: Secondary | ICD-10-CM | POA: Diagnosis not present

## 2022-03-07 DIAGNOSIS — I130 Hypertensive heart and chronic kidney disease with heart failure and stage 1 through stage 4 chronic kidney disease, or unspecified chronic kidney disease: Secondary | ICD-10-CM | POA: Diagnosis not present

## 2022-03-07 DIAGNOSIS — C189 Malignant neoplasm of colon, unspecified: Secondary | ICD-10-CM | POA: Diagnosis not present

## 2022-03-07 DIAGNOSIS — M1990 Unspecified osteoarthritis, unspecified site: Secondary | ICD-10-CM | POA: Diagnosis not present

## 2022-03-07 DIAGNOSIS — E1151 Type 2 diabetes mellitus with diabetic peripheral angiopathy without gangrene: Secondary | ICD-10-CM | POA: Diagnosis not present

## 2022-03-07 DIAGNOSIS — H409 Unspecified glaucoma: Secondary | ICD-10-CM | POA: Diagnosis not present

## 2022-03-07 DIAGNOSIS — I480 Paroxysmal atrial fibrillation: Secondary | ICD-10-CM | POA: Diagnosis not present

## 2022-03-07 DIAGNOSIS — E785 Hyperlipidemia, unspecified: Secondary | ICD-10-CM | POA: Diagnosis not present

## 2022-03-07 DIAGNOSIS — I700 Atherosclerosis of aorta: Secondary | ICD-10-CM | POA: Diagnosis not present

## 2022-03-07 DIAGNOSIS — K219 Gastro-esophageal reflux disease without esophagitis: Secondary | ICD-10-CM | POA: Diagnosis not present

## 2022-03-07 NOTE — Telephone Encounter (Signed)
[facility] is requesting home health physical therapy 1 time a week for 3 weeks.

## 2022-03-07 NOTE — Telephone Encounter (Signed)
Okay for orders? 

## 2022-03-08 ENCOUNTER — Encounter: Payer: Self-pay | Admitting: Internal Medicine

## 2022-03-08 NOTE — Telephone Encounter (Signed)
Message left today with verbals.

## 2022-03-08 NOTE — Progress Notes (Signed)
Subjective:    Patient ID: Troy Kindred [name], male    DOB: Nov 14, 1940, 81 y.o.   MRN: [id number]     HPI Troy [name] is here for follow up from the hospital.    Here was here 4/12 and routine blood work showed elevated LFTs.  Abdominal US showed dilated CBD up to 12 mm.  Diffused increased echogenicity of hepatic parenchyma. Mr abdomen MRCP showed prior cholecystectomy. Mild diffuse biliary ductal dilatation due to choledochlolithiasis.   ERCP 5/9 removed calculi from CBD  Admitted 02/12/22-02/16/22 for occult GIB  Recommendations at discharge:    Follow up with PCP within 1-2 weeks and repeat CBC,CMP,Mag,Phos within 1 week Follow up with Cardiology within 1-2 weeks Follow up with Gastroenterology and continuing PPI BID x8 weeks   Went to ED after a fall.   Anesthesia from ERCP made him constipated, weak.  He was having soft dark stools.  He fell in the bathroom secondary to lightheadedness w/o LOC.  No major injuries, but had several abrasion and forehead laceration that was repaired.   In ED hgb was 8, prior was 13.  Positive melena.  Placed on PPI.  GI consulted, did EGD.   EGD showed gastritis, gastric polyps - one of which was bleeding, non bleeding ulcers, Hgb dropped further and he was transfused 2 units PRBC.  H/H stabilized.  PT/OT eval advised SNF.  Diet advanced, eliquis restarted per GI.  He was a little volume and hypoxic overloaded, given IV lasix.  He improved.    To continue PPI bid x 8 weeks, then daily.    Vitamin B12 def - B12 level 166.  Given B12 SQ daily during hospital course - d/c'd on oral B12.     Discharged to rehab - now at home.   Energy level is coming back.  PT evaluation at home yesterday  Stent has not moved as it should - scheduled for EGD in August.       Medications and allergies reviewed with patient and updated if appropriate.  Current Outpatient Medications on File Prior to Visit  Medication Sig Dispense Refill   acetaminophen  (TYLENOL) 325 MG tablet Take 650 mg by mouth every 6 (six) hours as needed for moderate pain or headache.     albuterol (VENTOLIN HFA) 108 (90 Base) MCG/ACT inhaler Inhale 2 puffs into the lungs every 6 (six) hours as needed for wheezing or shortness of breath. 8.5 g 0   amLODipine (NORVASC) 5 MG tablet TAKE ONE TABLET BY MOUTH EVERY DAY 60 tablet 2   apixaban (ELIQUIS) 5 MG TABS tablet Take 1 tablet (5 mg total) by mouth 2 (two) times daily. 60 tablet 6   Brinzolamide-Brimonidine (SIMBRINZA) 1-0.2 % SUSP Apply 1 drop to eye in the morning, at noon, and at bedtime.     Budeson-Glycopyrrol-Formoterol (BREZTRI AEROSPHERE) 160-9-4.8 MCG/ACT AERO Inhale 2 puffs into the lungs in the morning and at bedtime. 10.7 g 0   docusate sodium (COLACE) 100 MG capsule Take 100 mg by mouth 2 (two) times daily.      escitalopram (LEXAPRO) 10 MG tablet TAKE ONE TABLET BY MOUTH EVERY DAY 28 tablet 5   furosemide (LASIX) 20 MG tablet Take 3 tablets (60 mg total) by mouth 2 (two) times daily. 180 tablet 3   JARDIANCE 10 MG TABS tablet TAKE ONE TABLET BY MOUTH EVERY DAY BEFORE BREAKFAST. 30 tablet 11   latanoprost (XALATAN) 0.005 % ophthalmic solution Place 1 drop into both eyes  at bedtime.     losartan (COZAAR) 25 MG tablet TAKE ONE TABLET BY MOUTH EVERY DAY 90 tablet 3   Melatonin 10 MG TABS Take 20 mg by mouth at bedtime.     metoprolol succinate (TOPROL-XL) 25 MG 24 hr tablet TAKE 1/2 TABLET EVERY EVENING WITH OR IMMEDIATELY FOLLOWING A MEAL 45 tablet 3   Multiple Vitamins-Minerals (CENTRUM SILVER PO) Take 1 tablet by mouth daily.     ondansetron (ZOFRAN) 4 MG tablet Take 1 tablet (4 mg total) by mouth every 6 (six) hours as needed for nausea. 20 tablet 0   OVER THE COUNTER MEDICATION Take 3 capsules by mouth daily. Balance of Nature Fruits     OVER THE COUNTER MEDICATION Take 3 capsules by mouth daily. Balance of Nature Vegetables     pantoprazole (PROTONIX) 40 MG tablet Take 1 tablet (40 mg total) by mouth 2 (two)  times daily. 60 tablet 0   potassium chloride SA (KLOR-CON M) 20 MEQ tablet Take 2 tablets (40 mEq total) by mouth every morning AND 1 tablet (20 mEq total) every evening. 90 tablet 6   rosuvastatin (CRESTOR) 40 MG tablet TAKE ONE TABLET BY MOUTH EVERY DAY 90 tablet 3   senna (SENOKOT) 8.6 MG tablet Take 1 tablet by mouth 2 (two) times daily.     vitamin B-12 (CYANOCOBALAMIN) 1000 MCG tablet Take 1 tablet (1,000 mcg total) by mouth daily. 30 tablet 0   No current facility-administered medications on file prior to visit.     Review of Systems  Constitutional:  Negative for appetite change (eatng well) and fever.  Respiratory:  Positive for shortness of breath (chronic - no change). Negative for cough and wheezing.   Cardiovascular:  Negative for chest pain, palpitations and leg swelling.  Gastrointestinal:  Positive for constipation (mild - taking stool softener). Negative for abdominal pain, diarrhea and nausea.       No gerd  Neurological:  Negative for dizziness, light-headedness and headaches.       Objective:   Vitals:   03/09/22 1102  BP: (!) 116/58  Pulse: 80  Temp: 97.9 F (36.6 C)  SpO2: 94%   BP Readings from Last 3 Encounters:  03/09/22 (!) 116/58  02/21/22 (!) 144/72  02/19/22 (!) 144/72   Wt Readings from Last 3 Encounters:  02/19/22 193 lb (87.5 kg)  02/12/22 193 lb (87.5 kg)  02/06/22 193 lb 12.6 oz (87.9 kg)   Body mass index is 26.18 kg/m.    Physical Exam Constitutional:      General: He is not in acute distress.    Appearance: Normal appearance. He is not ill-appearing.  HENT:     Head: Normocephalic and atraumatic.  Eyes:     Conjunctiva/sclera: Conjunctivae normal.  Cardiovascular:     Rate and Rhythm: Normal rate and regular rhythm.     Heart sounds: Normal heart sounds. No murmur heard. Pulmonary:     Effort: Pulmonary effort is normal. No respiratory distress.     Breath sounds: Normal breath sounds. No wheezing or rales.   Musculoskeletal:     Right lower leg: No edema.     Left lower leg: No edema.  Skin:    General: Skin is warm and dry.     Findings: No rash.  Neurological:     Mental Status: He is alert. Mental status is at baseline.  Psychiatric:        Mood and Affect: Mood normal.  Lab Results  Component Value Date   WBC 5.9 02/22/2022   HGB 10.2 (L) 02/22/2022   HCT 31.1 (L) 02/22/2022   PLT 387.0 02/22/2022   GLUCOSE 155 (H) 02/16/2022   CHOL 80 10/24/2021   TRIG 74 10/24/2021   HDL 31 (L) 10/24/2021   LDLDIRECT 41.0 07/11/2021   LDLCALC 34 10/24/2021   ALT 21 02/16/2022   AST 22 02/16/2022   NA 138 02/16/2022   K 3.2 (L) 02/16/2022   CL 104 02/16/2022   CREATININE 1.49 (H) 02/16/2022   BUN 21 02/16/2022   CO2 24 02/16/2022   TSH 1.69 01/10/2022   PSA 1.53 01/15/2008   INR 1.8 (H) 07/01/2021   HGBA1C 6.7 (H) 01/10/2022   MICROALBUR 1.2 01/10/2022     Assessment & Plan:    See Problem List for Assessment and Plan of chronic medical problems.

## 2022-03-09 ENCOUNTER — Other Ambulatory Visit: Payer: Self-pay | Admitting: Internal Medicine

## 2022-03-09 ENCOUNTER — Ambulatory Visit (INDEPENDENT_AMBULATORY_CARE_PROVIDER_SITE_OTHER): Payer: PPO | Admitting: Internal Medicine

## 2022-03-09 VITALS — BP 116/58 | HR 80 | Temp 97.9°F | Ht 72.0 in

## 2022-03-09 DIAGNOSIS — E538 Deficiency of other specified B group vitamins: Secondary | ICD-10-CM

## 2022-03-09 DIAGNOSIS — K922 Gastrointestinal hemorrhage, unspecified: Secondary | ICD-10-CM | POA: Diagnosis not present

## 2022-03-09 DIAGNOSIS — D649 Anemia, unspecified: Secondary | ICD-10-CM

## 2022-03-09 DIAGNOSIS — F3289 Other specified depressive episodes: Secondary | ICD-10-CM

## 2022-03-09 DIAGNOSIS — I10 Essential (primary) hypertension: Secondary | ICD-10-CM

## 2022-03-09 NOTE — Patient Instructions (Addendum)
        Medications changes include :   none      Return for follow up as scheduled.

## 2022-03-09 NOTE — Assessment & Plan Note (Signed)
Recent GI bleed resulting in hospitalization, symptomatic anemia EGD showed gastritis, bleeding gastric polyp and nonbleeding gastric ulcers On pantoprazole 40 mg twice daily x8 weeks and then 40 mg daily Has GI follow-up

## 2022-03-09 NOTE — Assessment & Plan Note (Addendum)
Chronic Controlled, Stable Continue Lexapro 10 mg daily

## 2022-03-09 NOTE — Assessment & Plan Note (Signed)
New Identified with work-up of anemia Did receive B12 injections daily while hospitalized and now taking oral B12-advised to continue long-term and we will monitor

## 2022-03-09 NOTE — Assessment & Plan Note (Signed)
S/p blood transfusion Currently asymptomatic Has shortness of breath, but that is his baseline and chronic He did have blood work while he was at rehab so we will hold off on rechecking blood work at this time No symptoms consistent with active bleeding

## 2022-03-09 NOTE — Assessment & Plan Note (Signed)
Chronic Blood pressure here well controlled-a little on the low side Continue to monitor but no change in medication today Continue amlodipine 5 mg daily, Lasix 60 mg twice daily, losartan 25 mg daily, metoprolol XL 12.5 mg daily

## 2022-03-12 NOTE — Progress Notes (Signed)
Carelink Summary Report / Loop Recorder 

## 2022-03-16 DIAGNOSIS — K219 Gastro-esophageal reflux disease without esophagitis: Secondary | ICD-10-CM | POA: Diagnosis not present

## 2022-03-16 DIAGNOSIS — E1151 Type 2 diabetes mellitus with diabetic peripheral angiopathy without gangrene: Secondary | ICD-10-CM | POA: Diagnosis not present

## 2022-03-16 DIAGNOSIS — Z9181 History of falling: Secondary | ICD-10-CM | POA: Diagnosis not present

## 2022-03-16 DIAGNOSIS — E1122 Type 2 diabetes mellitus with diabetic chronic kidney disease: Secondary | ICD-10-CM | POA: Diagnosis not present

## 2022-03-16 DIAGNOSIS — N1832 Chronic kidney disease, stage 3b: Secondary | ICD-10-CM | POA: Diagnosis not present

## 2022-03-16 DIAGNOSIS — H409 Unspecified glaucoma: Secondary | ICD-10-CM | POA: Diagnosis not present

## 2022-03-16 DIAGNOSIS — I700 Atherosclerosis of aorta: Secondary | ICD-10-CM | POA: Diagnosis not present

## 2022-03-16 DIAGNOSIS — F39 Unspecified mood [affective] disorder: Secondary | ICD-10-CM | POA: Diagnosis not present

## 2022-03-16 DIAGNOSIS — M4800 Spinal stenosis, site unspecified: Secondary | ICD-10-CM | POA: Diagnosis not present

## 2022-03-16 DIAGNOSIS — N400 Enlarged prostate without lower urinary tract symptoms: Secondary | ICD-10-CM | POA: Diagnosis not present

## 2022-03-16 DIAGNOSIS — Z87891 Personal history of nicotine dependence: Secondary | ICD-10-CM | POA: Diagnosis not present

## 2022-03-16 DIAGNOSIS — H547 Unspecified visual loss: Secondary | ICD-10-CM | POA: Diagnosis not present

## 2022-03-16 DIAGNOSIS — D62 Acute posthemorrhagic anemia: Secondary | ICD-10-CM | POA: Diagnosis not present

## 2022-03-16 DIAGNOSIS — F32A Depression, unspecified: Secondary | ICD-10-CM | POA: Diagnosis not present

## 2022-03-16 DIAGNOSIS — I130 Hypertensive heart and chronic kidney disease with heart failure and stage 1 through stage 4 chronic kidney disease, or unspecified chronic kidney disease: Secondary | ICD-10-CM | POA: Diagnosis not present

## 2022-03-16 DIAGNOSIS — I5032 Chronic diastolic (congestive) heart failure: Secondary | ICD-10-CM | POA: Diagnosis not present

## 2022-03-16 DIAGNOSIS — Z7984 Long term (current) use of oral hypoglycemic drugs: Secondary | ICD-10-CM | POA: Diagnosis not present

## 2022-03-16 DIAGNOSIS — C189 Malignant neoplasm of colon, unspecified: Secondary | ICD-10-CM | POA: Diagnosis not present

## 2022-03-16 DIAGNOSIS — I480 Paroxysmal atrial fibrillation: Secondary | ICD-10-CM | POA: Diagnosis not present

## 2022-03-16 DIAGNOSIS — F419 Anxiety disorder, unspecified: Secondary | ICD-10-CM | POA: Diagnosis not present

## 2022-03-16 DIAGNOSIS — I714 Abdominal aortic aneurysm, without rupture, unspecified: Secondary | ICD-10-CM | POA: Diagnosis not present

## 2022-03-16 DIAGNOSIS — G4733 Obstructive sleep apnea (adult) (pediatric): Secondary | ICD-10-CM | POA: Diagnosis not present

## 2022-03-16 DIAGNOSIS — E785 Hyperlipidemia, unspecified: Secondary | ICD-10-CM | POA: Diagnosis not present

## 2022-03-16 DIAGNOSIS — M1990 Unspecified osteoarthritis, unspecified site: Secondary | ICD-10-CM | POA: Diagnosis not present

## 2022-03-22 DIAGNOSIS — F32A Depression, unspecified: Secondary | ICD-10-CM | POA: Diagnosis not present

## 2022-03-22 DIAGNOSIS — Z87891 Personal history of nicotine dependence: Secondary | ICD-10-CM | POA: Diagnosis not present

## 2022-03-22 DIAGNOSIS — Z7984 Long term (current) use of oral hypoglycemic drugs: Secondary | ICD-10-CM | POA: Diagnosis not present

## 2022-03-22 DIAGNOSIS — H409 Unspecified glaucoma: Secondary | ICD-10-CM | POA: Diagnosis not present

## 2022-03-22 DIAGNOSIS — F39 Unspecified mood [affective] disorder: Secondary | ICD-10-CM | POA: Diagnosis not present

## 2022-03-22 DIAGNOSIS — H547 Unspecified visual loss: Secondary | ICD-10-CM | POA: Diagnosis not present

## 2022-03-22 DIAGNOSIS — F419 Anxiety disorder, unspecified: Secondary | ICD-10-CM | POA: Diagnosis not present

## 2022-03-22 DIAGNOSIS — C189 Malignant neoplasm of colon, unspecified: Secondary | ICD-10-CM | POA: Diagnosis not present

## 2022-03-22 DIAGNOSIS — N1832 Chronic kidney disease, stage 3b: Secondary | ICD-10-CM | POA: Diagnosis not present

## 2022-03-22 DIAGNOSIS — I700 Atherosclerosis of aorta: Secondary | ICD-10-CM | POA: Diagnosis not present

## 2022-03-22 DIAGNOSIS — E1151 Type 2 diabetes mellitus with diabetic peripheral angiopathy without gangrene: Secondary | ICD-10-CM | POA: Diagnosis not present

## 2022-03-22 DIAGNOSIS — N400 Enlarged prostate without lower urinary tract symptoms: Secondary | ICD-10-CM | POA: Diagnosis not present

## 2022-03-22 DIAGNOSIS — D62 Acute posthemorrhagic anemia: Secondary | ICD-10-CM | POA: Diagnosis not present

## 2022-03-22 DIAGNOSIS — M4800 Spinal stenosis, site unspecified: Secondary | ICD-10-CM | POA: Diagnosis not present

## 2022-03-22 DIAGNOSIS — I714 Abdominal aortic aneurysm, without rupture, unspecified: Secondary | ICD-10-CM | POA: Diagnosis not present

## 2022-03-22 DIAGNOSIS — I130 Hypertensive heart and chronic kidney disease with heart failure and stage 1 through stage 4 chronic kidney disease, or unspecified chronic kidney disease: Secondary | ICD-10-CM | POA: Diagnosis not present

## 2022-03-22 DIAGNOSIS — E785 Hyperlipidemia, unspecified: Secondary | ICD-10-CM | POA: Diagnosis not present

## 2022-03-22 DIAGNOSIS — E1122 Type 2 diabetes mellitus with diabetic chronic kidney disease: Secondary | ICD-10-CM | POA: Diagnosis not present

## 2022-03-22 DIAGNOSIS — G4733 Obstructive sleep apnea (adult) (pediatric): Secondary | ICD-10-CM | POA: Diagnosis not present

## 2022-03-22 DIAGNOSIS — K219 Gastro-esophageal reflux disease without esophagitis: Secondary | ICD-10-CM | POA: Diagnosis not present

## 2022-03-22 DIAGNOSIS — M1990 Unspecified osteoarthritis, unspecified site: Secondary | ICD-10-CM | POA: Diagnosis not present

## 2022-03-22 DIAGNOSIS — I480 Paroxysmal atrial fibrillation: Secondary | ICD-10-CM | POA: Diagnosis not present

## 2022-03-22 DIAGNOSIS — I5032 Chronic diastolic (congestive) heart failure: Secondary | ICD-10-CM | POA: Diagnosis not present

## 2022-03-22 DIAGNOSIS — Z9181 History of falling: Secondary | ICD-10-CM | POA: Diagnosis not present

## 2022-03-23 ENCOUNTER — Telehealth: Payer: Self-pay

## 2022-03-23 ENCOUNTER — Other Ambulatory Visit: Payer: Self-pay

## 2022-03-23 DIAGNOSIS — D649 Anemia, unspecified: Secondary | ICD-10-CM

## 2022-03-29 ENCOUNTER — Telehealth: Payer: Self-pay | Admitting: Internal Medicine

## 2022-03-29 DIAGNOSIS — N400 Enlarged prostate without lower urinary tract symptoms: Secondary | ICD-10-CM | POA: Diagnosis not present

## 2022-03-29 DIAGNOSIS — H409 Unspecified glaucoma: Secondary | ICD-10-CM | POA: Diagnosis not present

## 2022-03-29 DIAGNOSIS — M4800 Spinal stenosis, site unspecified: Secondary | ICD-10-CM | POA: Diagnosis not present

## 2022-03-29 DIAGNOSIS — E785 Hyperlipidemia, unspecified: Secondary | ICD-10-CM | POA: Diagnosis not present

## 2022-03-29 DIAGNOSIS — M1990 Unspecified osteoarthritis, unspecified site: Secondary | ICD-10-CM | POA: Diagnosis not present

## 2022-03-29 DIAGNOSIS — I130 Hypertensive heart and chronic kidney disease with heart failure and stage 1 through stage 4 chronic kidney disease, or unspecified chronic kidney disease: Secondary | ICD-10-CM | POA: Diagnosis not present

## 2022-03-29 DIAGNOSIS — D62 Acute posthemorrhagic anemia: Secondary | ICD-10-CM | POA: Diagnosis not present

## 2022-03-29 DIAGNOSIS — E1151 Type 2 diabetes mellitus with diabetic peripheral angiopathy without gangrene: Secondary | ICD-10-CM | POA: Diagnosis not present

## 2022-03-29 DIAGNOSIS — G4733 Obstructive sleep apnea (adult) (pediatric): Secondary | ICD-10-CM | POA: Diagnosis not present

## 2022-03-29 DIAGNOSIS — F32A Depression, unspecified: Secondary | ICD-10-CM | POA: Diagnosis not present

## 2022-03-29 DIAGNOSIS — E1122 Type 2 diabetes mellitus with diabetic chronic kidney disease: Secondary | ICD-10-CM | POA: Diagnosis not present

## 2022-03-29 DIAGNOSIS — F419 Anxiety disorder, unspecified: Secondary | ICD-10-CM | POA: Diagnosis not present

## 2022-03-29 DIAGNOSIS — Z9181 History of falling: Secondary | ICD-10-CM | POA: Diagnosis not present

## 2022-03-29 DIAGNOSIS — Z7984 Long term (current) use of oral hypoglycemic drugs: Secondary | ICD-10-CM | POA: Diagnosis not present

## 2022-03-29 DIAGNOSIS — I480 Paroxysmal atrial fibrillation: Secondary | ICD-10-CM | POA: Diagnosis not present

## 2022-03-29 DIAGNOSIS — N1832 Chronic kidney disease, stage 3b: Secondary | ICD-10-CM | POA: Diagnosis not present

## 2022-03-29 DIAGNOSIS — I700 Atherosclerosis of aorta: Secondary | ICD-10-CM | POA: Diagnosis not present

## 2022-03-29 DIAGNOSIS — I714 Abdominal aortic aneurysm, without rupture, unspecified: Secondary | ICD-10-CM | POA: Diagnosis not present

## 2022-03-29 DIAGNOSIS — K219 Gastro-esophageal reflux disease without esophagitis: Secondary | ICD-10-CM | POA: Diagnosis not present

## 2022-03-29 DIAGNOSIS — Z87891 Personal history of nicotine dependence: Secondary | ICD-10-CM | POA: Diagnosis not present

## 2022-03-29 DIAGNOSIS — H547 Unspecified visual loss: Secondary | ICD-10-CM | POA: Diagnosis not present

## 2022-03-29 DIAGNOSIS — C189 Malignant neoplasm of colon, unspecified: Secondary | ICD-10-CM | POA: Diagnosis not present

## 2022-03-29 DIAGNOSIS — F39 Unspecified mood [affective] disorder: Secondary | ICD-10-CM | POA: Diagnosis not present

## 2022-03-29 DIAGNOSIS — I5032 Chronic diastolic (congestive) heart failure: Secondary | ICD-10-CM | POA: Diagnosis not present

## 2022-03-29 NOTE — Telephone Encounter (Signed)
Request HH-PT 2 more visits   Please Advice

## 2022-03-30 NOTE — Telephone Encounter (Signed)
Ok for [employer], thanks

## 2022-03-30 NOTE — Telephone Encounter (Signed)
LDVM for jerrilynn with verbal ok for Request HH-PT 2 more visits

## 2022-04-02 ENCOUNTER — Ambulatory Visit (INDEPENDENT_AMBULATORY_CARE_PROVIDER_SITE_OTHER): Payer: PPO

## 2022-04-02 DIAGNOSIS — I480 Paroxysmal atrial fibrillation: Secondary | ICD-10-CM | POA: Diagnosis not present

## 2022-04-03 LAB — CUP PACEART REMOTE DEVICE CHECK
Date Time Interrogation Session: 20230702231655
Implantable Pulse Generator Implant Date: 20200709

## 2022-04-04 ENCOUNTER — Other Ambulatory Visit: Payer: Self-pay

## 2022-04-04 ENCOUNTER — Ambulatory Visit (INDEPENDENT_AMBULATORY_CARE_PROVIDER_SITE_OTHER): Payer: PPO | Admitting: Gastroenterology

## 2022-04-04 ENCOUNTER — Encounter: Payer: Self-pay | Admitting: Gastroenterology

## 2022-04-04 ENCOUNTER — Other Ambulatory Visit (INDEPENDENT_AMBULATORY_CARE_PROVIDER_SITE_OTHER): Payer: PPO

## 2022-04-04 ENCOUNTER — Ambulatory Visit (INDEPENDENT_AMBULATORY_CARE_PROVIDER_SITE_OTHER)
Admission: RE | Admit: 2022-04-04 | Discharge: 2022-04-04 | Disposition: A | Discharge status: Home / Self Care | Payer: PPO | Source: Ambulatory Visit | Attending: Gastroenterology | Admitting: Gastroenterology

## 2022-04-04 VITALS — BP 118/60 | HR 59 | Ht 73.0 in | Wt 194.0 lb

## 2022-04-04 DIAGNOSIS — K31A0 Gastric intestinal metaplasia, unspecified: Secondary | ICD-10-CM | POA: Diagnosis not present

## 2022-04-04 DIAGNOSIS — T85528D Displacement of other gastrointestinal prosthetic devices, implants and grafts, subsequent encounter: Secondary | ICD-10-CM

## 2022-04-04 DIAGNOSIS — D649 Anemia, unspecified: Secondary | ICD-10-CM

## 2022-04-04 DIAGNOSIS — Z86010 Personal history of colonic polyps: Secondary | ICD-10-CM | POA: Diagnosis not present

## 2022-04-04 DIAGNOSIS — K91841 Postprocedural hemorrhage and hematoma of a digestive system organ or structure following other procedure: Secondary | ICD-10-CM | POA: Diagnosis not present

## 2022-04-04 DIAGNOSIS — Z9889 Other specified postprocedural states: Secondary | ICD-10-CM

## 2022-04-04 DIAGNOSIS — Z85038 Personal history of other malignant neoplasm of large intestine: Secondary | ICD-10-CM

## 2022-04-04 DIAGNOSIS — K922 Gastrointestinal hemorrhage, unspecified: Secondary | ICD-10-CM | POA: Diagnosis not present

## 2022-04-04 DIAGNOSIS — K8020 Calculus of gallbladder without cholecystitis without obstruction: Secondary | ICD-10-CM | POA: Diagnosis not present

## 2022-04-04 DIAGNOSIS — Z9689 Presence of other specified functional implants: Secondary | ICD-10-CM | POA: Diagnosis not present

## 2022-04-04 DIAGNOSIS — K8050 Calculus of bile duct without cholangitis or cholecystitis without obstruction: Secondary | ICD-10-CM

## 2022-04-04 DIAGNOSIS — J90 Pleural effusion, not elsewhere classified: Secondary | ICD-10-CM | POA: Diagnosis not present

## 2022-04-04 LAB — IBC + FERRITIN
Ferritin: 44.6 ng/mL (ref 22.0–322.0)
Iron: 61 ug/dL (ref 42–165)
Saturation Ratios: 15.1 % — ABNORMAL LOW (ref 20.0–50.0)
TIBC: 404.6 ug/dL (ref 250.0–450.0)
Transferrin: 289 mg/dL (ref 212.0–360.0)

## 2022-04-04 LAB — HEPATIC FUNCTION PANEL
ALT: 13 U/L (ref 0–53)
AST: 15 U/L (ref 0–37)
Albumin: 4.2 g/dL (ref 3.5–5.2)
Alkaline Phosphatase: 76 U/L (ref 39–117)
Bilirubin, Direct: 0.1 mg/dL (ref 0.0–0.3)
Total Bilirubin: 0.7 mg/dL (ref 0.2–1.2)
Total Protein: 6.7 g/dL (ref 6.0–8.3)

## 2022-04-04 LAB — CBC WITH DIFFERENTIAL/PLATELET
Basophils Absolute: 0 10*3/uL (ref 0.0–0.1)
Basophils Relative: 0.7 % (ref 0.0–3.0)
Eosinophils Absolute: 0.2 10*3/uL (ref 0.0–0.7)
Eosinophils Relative: 3.6 % (ref 0.0–5.0)
HCT: 36.7 % — ABNORMAL LOW (ref 39.0–52.0)
Hemoglobin: 12 g/dL — ABNORMAL LOW (ref 13.0–17.0)
Lymphocytes Relative: 16.2 % (ref 12.0–46.0)
Lymphs Abs: 0.7 10*3/uL (ref 0.7–4.0)
MCHC: 32.6 g/dL (ref 30.0–36.0)
MCV: 90.1 fl (ref 78.0–100.0)
Monocytes Absolute: 0.2 10*3/uL (ref 0.1–1.0)
Monocytes Relative: 5.4 % (ref 3.0–12.0)
Neutro Abs: 3.2 10*3/uL (ref 1.4–7.7)
Neutrophils Relative %: 74.1 % (ref 43.0–77.0)
Platelets: 284 10*3/uL (ref 150.0–400.0)
RBC: 4.07 Mil/uL — ABNORMAL LOW (ref 4.22–5.81)
RDW: 18.8 % — ABNORMAL HIGH (ref 11.5–15.5)
WBC: 4.3 10*3/uL (ref 4.0–10.5)

## 2022-04-04 NOTE — Progress Notes (Signed)
[facility] VISIT   Primary Care Provider Binnie Rail, MD [place] [address] [phone]  Patient Profile: Troy [name]. is a 81 y.o. male with a pmh significant for Colon cancer (status post LAR), colon polyps, diverticulosis, cholelithiasis (s/p ERCP for choledocholithiasis but GB still in place), GIM, chronic renal insufficiency, carotid artery stenosis, history of prior BCC, A. fib (on anticoagulation), BPH, prior lung cancer, sleep apnea, peripheral arterial disease, hypertension, hyperlipidemia, iron deficiency.  The patient presents to the [facility] for an evaluation and management of problem(s) noted below:  Problem List 1. Choledocholithiasis   2. Calculus of gallbladder without cholecystitis without obstruction   3. Bleeding from sphincterotomy site   4. Upper GI bleeding   5. Migration of pancreatic stent, subsequent encounter   6. History of ERCP   7. Anemia, unspecified type   8. Gastric intestinal metaplasia   9. Personal history of colon cancer   10. Hx of adenomatous colonic polyps      History of Present Illness Please see prior notes for full details of HPI.   Interval History The patient returns for follow-up today.  Overall he is doing well.  The patient was referred back to me earlier this year when he was found to have abnormal LFTs and an MRI/MRCP that showed evidence of choledocholithiasis.  He underwent an ERCP by me in May that required sphincterotomy and sphincteroplasty for stone removal.  Patient did well after his procedure.  He was restarted on his blood thinner.  1 week after his procedure however he came in with melena and anemia.  A repeat upper endoscopy was performed that showed a visible vessel at the site of the sphincterotomy that was treated endoscopically.  A small fundic gland polyp/hyperplastic gastric polyp with mild oozing was also noted and resected.  He was  able to be discharged.  Patient states he has done well.  We offered the patient a referral to surgery to consider cholecystectomy but he does not want to undergo surgery at this time unless something develops in the future.  He is feeling well otherwise.  He has been regaining his strength.  He denies any abdominal pain or discomfort.  He has not had any other changes in his bowel habits.    GI Review of Systems Positive as above Negative for dysphagia, odynophagia, pain, alteration of bowel habits, further melena, hematochezia  Review of Systems General: Denies fevers/chills/weight loss unintentionally Cardiovascular: Denies chest pain Pulmonary: Denies shortness of breath Gastroenterological: See HPI Genitourinary: Denies darkened urine Hematological: Denies easy bruising/bleeding Dermatological: Denies jaundice Psychological: Mood is stable   Medications Current Outpatient Medications  Medication Sig Dispense Refill   acetaminophen (TYLENOL) 325 MG tablet Take 650 mg by mouth every 6 (six) hours as needed for moderate pain or headache.     albuterol (VENTOLIN HFA) 108 (90 Base) MCG/ACT inhaler Inhale 2 puffs into the lungs every 6 (six) hours as needed for wheezing or shortness of breath. 8.5 g 0   amLODipine (NORVASC) 5 MG tablet TAKE ONE TABLET BY MOUTH EVERY DAY 60 tablet 2   apixaban (ELIQUIS) 5 MG TABS tablet Take 1 tablet (5 mg total) by mouth 2 (two) times daily. 60 tablet 6   Brinzolamide-Brimonidine (SIMBRINZA) 1-0.2 % SUSP Apply 1 drop to eye in the morning, at noon, and at bedtime.     Budeson-Glycopyrrol-Formoterol (BREZTRI AEROSPHERE) 160-9-4.8 MCG/ACT AERO Inhale 2 puffs into the lungs in the morning  and at bedtime. 10.7 g 0   docusate sodium (COLACE) 100 MG capsule Take 100 mg by mouth 2 (two) times daily.      escitalopram (LEXAPRO) 10 MG tablet TAKE ONE TABLET BY MOUTH EVERY DAY 28 tablet 5   furosemide (LASIX) 20 MG tablet Take 3 tablets (60 mg total) by mouth 2  (two) times daily. 180 tablet 3   JARDIANCE 10 MG TABS tablet TAKE ONE TABLET BY MOUTH EVERY DAY BEFORE BREAKFAST. 30 tablet 11   latanoprost (XALATAN) 0.005 % ophthalmic solution Place 1 drop into both eyes at bedtime.     losartan (COZAAR) 25 MG tablet TAKE ONE TABLET BY MOUTH EVERY DAY 90 tablet 3   Melatonin 10 MG TABS Take 20 mg by mouth at bedtime.     metoprolol succinate (TOPROL-XL) 25 MG 24 hr tablet TAKE 1/2 TABLET EVERY EVENING WITH OR IMMEDIATELY FOLLOWING A MEAL 45 tablet 3   montelukast (SINGULAIR) 10 MG tablet TAKE ONE TABLET BY MOUTH AT BEDTIME 28 tablet 5   Multiple Vitamins-Minerals (CENTRUM SILVER PO) Take 1 tablet by mouth daily.     ondansetron (ZOFRAN) 4 MG tablet Take 1 tablet (4 mg total) by mouth every 6 (six) hours as needed for nausea. 20 tablet 0   OVER THE COUNTER MEDICATION Take 3 capsules by mouth daily. Balance of Nature Fruits     OVER THE COUNTER MEDICATION Take 3 capsules by mouth daily. Balance of Nature Vegetables     pantoprazole (PROTONIX) 40 MG tablet Take 1 tablet (40 mg total) by mouth 2 (two) times daily. 60 tablet 0   potassium chloride SA (KLOR-CON M) 20 MEQ tablet Take 2 tablets (40 mEq total) by mouth every morning AND 1 tablet (20 mEq total) every evening. 90 tablet 6   rosuvastatin (CRESTOR) 40 MG tablet TAKE ONE TABLET BY MOUTH EVERY DAY 90 tablet 3   senna (SENOKOT) 8.6 MG tablet Take 1 tablet by mouth 2 (two) times daily.     vitamin B-12 (CYANOCOBALAMIN) 1000 MCG tablet Take 1 tablet (1,000 mcg total) by mouth daily. 30 tablet 0   No current facility-administered medications for this visit.    Allergies Allergies  Allergen Reactions   Niacin-Lovastatin Er Shortness Of Breath and Other (See Comments)    Dyspnea, flushing   Penicillins Hives, Shortness Of Breath and Other (See Comments)    Flushing  & dyspnea Because of a history of documented adverse serious drug reaction;Medi Alert bracelet  is recommended PCN reaction causing immediate  rash, facial/tongue/throat swelling, SOB or lightheadedness with hypotension: Yes PCN reaction causing severe rash involving mucus membranes or skin necrosis: No PCN reaction occurring within the last 10 years: NO PCN reaction that required hospitalization: NO   Sulfonamide Derivatives Hives, Shortness Of Breath and Other (See Comments)    Flushing & dyspnea Because of a history of documented adverse serious drug reaction;Medi Alert bracelet  is recommended   Atorvastatin Other (See Comments)    Myalgias & athralgias- takes Crestor, DOES NOT LIKE LIPITOR    Histories Past Medical History:  Diagnosis Date   AAA (abdominal aortic aneurysm) ([place]) LAST ABDOMINAL US 10-20-17 3.3 CM   MONITORED BY DR [name]   Anxiety    Arthritis    Atrial fibrillation (HCC)    Basal cell carcinoma    BPH (benign prostatic hypertrophy)    Chronic diastolic heart failure (HCC)    Chronic kidney disease    stage 3, pt unaware   Colon cancer ([place])  COPD (chronic obstructive pulmonary disease) (HCC)    Depression    GERD (gastroesophageal reflux disease)    Glaucoma BOTH EYES   Dr Gershon Crane   History of lung cancer APRIL 2012  SQUAMOUS CELL---- S/P RIGHT LOWER LOBECTOMY AT [facility] --  NO CHEMORADIATION---  NO RECURRENCE    ONCOLOGIST- DR Tressie Stalker  LOV IN [facility] 10-27-2012   History of pneumothorax    pt unaware   Hx of adenomatous colonic polyps 2005    X 2; 1 hyperplastic polyp; Dr Olevia Perches   Hyperlipidemia    Hypertension    Impaired fasting glucose 2007   108; A1c5.4%   OSA (obstructive sleep apnea) 08/29/2015   CPAP SET ON 10   Peripheral vascular disease (HCC) S/P ANGIOPLASTY AND STENTING   FOLLOWED  BY DR DICKSON   Spinal stenosis 06/2014   Status post placement of implantable loop recorder    Thoracic aorta atherosclerosis (HCC)    Thyrotoxicosis    amiodarone induced   Past Surgical History:  Procedure Laterality Date   ANGIO PLASTY     X 4 in legs   AORTOGRAM  07-27-2002   MILD DIFFUSE  ILIAC ARTERY OCCLUSIVE DISEASE /  LEFT RENAL ARTERY 20%/ PATENT LEFT FEM-POP GRAFT/ MILD SFA AND POPLITEAL ARTERY OCCLUSIVE DISEASE W/ SEVERE KIDNEY OCCLUSIVE DISEASE   ATRIAL FIBRILLATION ABLATION N/A 04/09/2019   Procedure: ATRIAL FIBRILLATION ABLATION;  Surgeon: Thompson Grayer, MD;  Location: [facility];  Service: Cardiovascular;  Laterality: N/A;   ATRIAL FIBRILLATION ABLATION N/A 02/12/2020   Procedure: ATRIAL FIBRILLATION ABLATION;  Surgeon: Thompson Grayer, MD;  Location: [facility];  Service: Cardiovascular;  Laterality: N/A;   BALLOON DILATION N/A 02/06/2022   Procedure: BALLOON Tamponade ampulla;  Surgeon: Irving Copas., MD;  Location: [name];  Service: Gastroenterology;  Laterality: N/A;   BASAL CELL CARCINOMA EXCISION     MULTIPLE TIMES--  RIGHT FOREARM, CHEEKS, AND BACK   BIOPSY  06/25/2018   Procedure: BIOPSY;  Surgeon: Rush Landmark Telford Nab., MD;  Location: [facility];  Service: Gastroenterology;;   BIOPSY  02/06/2022   Procedure: BIOPSY;  Surgeon: Irving Copas., MD;  Location: [facility];  Service: Gastroenterology;;   CARDIOVASCULAR STRESS TEST  12-08-2012  DR [facility]   NORMAL LEXISCAN WITH NO EXERCISE NUCLEAR STUDY/ EF 66%/   NO ISCHEMIA/ NO SIGNIFICANT CHANGE FROM PRIOR STUDY   CARDIOVERSION N/A 02/14/2018   Procedure: CARDIOVERSION;  Surgeon: Larey Dresser, MD;  Location: [place];  Service: Cardiovascular;  Laterality: N/A;   CAROTID ANGIOGRAM N/A 07/10/2013   Procedure: CAROTID ANGIOGRAM;  Surgeon: Elam Dutch, MD;  Location: [facility] CATH LAB;  Service: Cardiovascular;  Laterality: N/A;   CAROTID ENDARTERECTOMY Right 07-14-13   cea   CATARACT EXTRACTION W/ INTRAOCULAR LENS  IMPLANT, BILATERAL     colon polyectomy     COLONOSCOPY     COLONOSCOPY WITH PROPOFOL N/A 06/25/2018   Procedure: COLONOSCOPY WITH PROPOFOL;  Surgeon: Rush Landmark Telford Nab., MD;  Location: [place];  Service: Gastroenterology;  Laterality: N/A;    CYSTOSCOPY W/ RETROGRADES Bilateral 01/21/2013   Procedure: CYSTOSCOPY WITH RETROGRADE PYELOGRAM;  Surgeon: Molli Hazard, MD;  Location[facility];  Service: Urology;  Laterality: Bilateral;   CYSTO, BLADDER BIOPSY, BILATERAL RETROGRADE PYELOGRAM  RAD TECH FROM RADIOLOGY PER JOY   CYSTOSCOPY WITH BIOPSY N/A 01/21/2013   Procedure: CYSTOSCOPY WITH BIOPSY;  Surgeon: Molli Hazard, MD;  Location: [facility];  Service: Urology;  Laterality: N/A;  ENDARTERECTOMY Right 07/14/2013   Procedure: ENDARTERECTOMY CAROTID;  Surgeon: Angelia Mould, MD;  Location: [facility] OR;  Service: Vascular;  Laterality: Right;   ENDOSCOPIC RETROGRADE CHOLANGIOPANCREATOGRAPHY (ERCP) WITH PROPOFOL N/A 02/06/2022   Procedure: ENDOSCOPIC RETROGRADE CHOLANGIOPANCREATOGRAPHY (ERCP) WITH PROPOFOL;  Surgeon: Irving Copas., MD;  Location: [name];  Service: Gastroenterology;  Laterality: N/A;   EP IMPLANTABLE DEVICE N/A 08/12/2015   Procedure: Loop Recorder Insertion;  Surgeon: Thompson Grayer, MD;  Location: [place] [facility];  Service: Cardiovascular;  Laterality: N/A;   ESOPHAGOGASTRODUODENOSCOPY Left 02/13/2022   Procedure: ESOPHAGOGASTRODUODENOSCOPY (EGD);  Surgeon: Lavena Bullion, DO;  Location: [facility] ENDOSCOPY;  Service: Gastroenterology;  Laterality: Left;   ESOPHAGOGASTRODUODENOSCOPY (EGD) WITH PROPOFOL N/A 06/25/2018   Procedure: ESOPHAGOGASTRODUODENOSCOPY (EGD) WITH PROPOFOL;  Surgeon: Rush Landmark Telford Nab., MD;  Location: [place];  Service: Gastroenterology;  Laterality: N/A;   ESOPHAGOGASTRODUODENOSCOPY (EGD) WITH PROPOFOL N/A 07/10/2018   Procedure: ESOPHAGOGASTRODUODENOSCOPY (EGD) WITH PROPOFOL;  Surgeon: Milus Banister, MD;  [facility];  Service: Endoscopy;  Laterality: N/A;   EUS N/A 07/10/2018   Procedure: UPPER ENDOSCOPIC ULTRASOUND (EUS) RADIAL;  Surgeon: Milus Banister, MD;  Location: [facility];  Service: Endoscopy;   Laterality: N/A;   EYE SURGERY Right    FEMORAL-POPLITEAL BYPASS GRAFT Left [place]   AND 2001  IN [place]   FEMORAL-POPLITEAL BYPASS GRAFT Left 08/30/2015   Procedure: REVISION OF BYPASS GRAFT Left  FEMORAL-POPLITEAL ARTERY;  Surgeon: Angelia Mould, MD;  Location: [place];  Service: Vascular;  Laterality: Left;   FINE NEEDLE ASPIRATION N/A 07/10/2018   Procedure: FINE NEEDLE ASPIRATION (FNA) LINEAR;  Surgeon: Milus Banister, MD;  [facility];  Service: Endoscopy;  Laterality: N/A;   FLEXIBLE SIGMOIDOSCOPY N/A 12/12/2018   Procedure: FLEXIBLE SIGMOIDOSCOPY;  Surgeon: Ileana Roup, MD;  [facility]: [facility];  Service: General;  Laterality: N/A;   HEMOSTASIS CLIP PLACEMENT  02/13/2022   Procedure: HEMOSTASIS CLIP PLACEMENT;  Surgeon: Lavena Bullion, DO;  Location: [name];  Service: Gastroenterology;;   HOT HEMOSTASIS N/A 02/13/2022   Procedure: HOT HEMOSTASIS (ARGON PLASMA COAGULATION/BICAP);  Surgeon: Lavena Bullion, DO;  Location: [facility] ENDOSCOPY;  Service: Gastroenterology;  Laterality: N/A;   LARYNGOSCOPY  06-27-2004   BX VOCAL CORD  (LEUKOPLAKIA)  PER PT NO ISSUES SINCE   LEFT HEART CATH AND CORONARY ANGIOGRAPHY N/A 08/20/2017   Procedure: LEFT HEART CATH AND CORONARY ANGIOGRAPHY;  Surgeon: Larey Dresser, MD;  Location: [place] [facility];  Service: Cardiovascular;  Laterality: N/A;   LOOP RECORDER INSERTION N/A 04/09/2019   Procedure: LOOP RECORDER INSERTION;  Surgeon: Thompson Grayer, MD;  Location: [place] CV [facility];  Service: Cardiovascular;  Laterality: N/A;   LOOP RECORDER REMOVAL N/A 04/09/2019   Procedure: LOOP RECORDER REMOVAL;  Surgeon: Thompson Grayer, MD;  Location: [place] [facility];  Service: Cardiovascular;  Laterality: N/A;   LOWER EXTREMITY ANGIOGRAM Bilateral 08/29/2015   Procedure: Lower Extremity Angiogram;  Surgeon: Angelia Mould, MD;  Location: [place] [facility];  Service: Cardiovascular;  Laterality: Bilateral;   LUNG  LOBECTOMY  01/24/2011    RIGHT UPPER LOBE  (SQUAMOUS CELL CARCINOMA) Dr Dorthea Cove , [facility]. No chemotherapyor radiation   PANCREATIC STENT PLACEMENT  02/06/2022   Procedure: PANCREATIC STENT PLACEMENT;  Surgeon: Rush Landmark Telford Nab., MD;  Location: [name];  Service: Gastroenterology;;   [facility] ANGIOPLASTY Right 07/14/2013   Procedure: PATCH ANGIOPLASTY;  Surgeon: Angelia Mould, MD;  Location: [place];  Service: Vascular;  Laterality:  Right;   PATCH ANGIOPLASTY Left 08/30/2015   Procedure: VEIN PATCH ANGIOPLASTY OF PROXIMAL Left BYPASS GRAFT;  Surgeon: Angelia Mould, MD;  Location: [place];  Service: Vascular;  Laterality: Left;   PERIPHERAL VASCULAR CATHETERIZATION N/A 08/29/2015   Procedure: Abdominal Aortogram;  Surgeon: Angelia Mould, MD;  Location: [place] [facility];  Service: Cardiovascular;  Laterality: N/A;   POLYPECTOMY  06/25/2018   Procedure: POLYPECTOMY;  Surgeon: Rush Landmark Telford Nab., MD;  Location: [place];  Service: Gastroenterology;;   POLYPECTOMY     POLYPECTOMY  02/13/2022   Procedure: POLYPECTOMY;  Surgeon: Lavena Bullion, DO;  Location: [place];  Service: Gastroenterology;;   REMOVAL OF STONES  02/06/2022   Procedure: REMOVAL OF STONES;  Surgeon: Irving Copas., MD;  Location[name];  Service: Gastroenterology;;   Clide Deutscher  02/13/2022   Procedure: Clide Deutscher;  Surgeon: Lavena Bullion, DO;  Location: [place];  Service: Gastroenterology;;   Joan Mayans  02/06/2022   Procedure: Joan Mayans;  Surgeon: Irving Copas., MD;  Location: [name];  Service: Gastroenterology;;   SUBMUCOSAL INJECTION  06/25/2018   Procedure: SUBMUCOSAL INJECTION;  Surgeon: Irving Copas., MD;  Location: [place];  Service: Gastroenterology;;   TEE WITHOUT CARDIOVERSION N/A 02/14/2018   Procedure: TRANSESOPHAGEAL ECHOCARDIOGRAM (TEE);  Surgeon: Larey Dresser, MD;  Location: [facility] ENDOSCOPY;  Service:  Cardiovascular;  Laterality: N/A;   trabecular surgery     OS   TRANSTHORACIC ECHOCARDIOGRAM  12-29-2012  DR [facility]   MILD LVH/  LVSF NORMAL/ EF 18-56%/  GRADE I DIASTOLIC DYSFUNCTION   Social History   Socioeconomic History   Marital status: Married    Spouse name: [name]    Number of children: 3   Years of education: 12+   Highest education level: Not on file  Occupational History   Occupation: Retired    Comment: Owns a [employer], Advertising account executive, 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  Tobacco Use   Smoking status: Former    Packs/day: 0.50    Years: 50.00    Total pack years: 25.00    Types: Cigarettes    Quit date: 07/28/2014    Years since quitting: 7.6   Smokeless tobacco: Never  Vaping Use   Vaping Use: Never used  Substance and Sexual Activity   Alcohol use: Yes    Alcohol/week: 4.0 standard drinks of alcohol    Types: 1 Glasses of wine, 1 Cans of beer, 1 Shots of liquor, 1 Standard drinks or equivalent per week    Comment: socially, variable; moderately heavy use in the past   Drug use: No   Sexual activity: Not Currently  Other Topics Concern   Not on file  Social History Narrative   Patient lives at home a lone has caregivers   Patient has 3 children    Patient is right handed    Social Determinants of Health   Financial Resource Strain: Low Risk  (09/18/2021)   Overall Financial Resource Strain (CARDIA)    Difficulty of Paying Living Expenses: Not hard at all  Food Insecurity: No Food Insecurity (09/18/2021)   Hunger Vital Sign    Worried About Running Out of Food in the Last Year: Never true    Ran Out of Food in the Last Year: Never true  Transportation Needs: No Transportation Needs (09/18/2021)   PRAPARE - Hydrologist (Medical): No    Lack of Transportation (Non-Medical):  No  Physical Activity: Sufficiently Active (09/18/2021)   Exercise Vital Sign    Days of  Exercise per Week: 3 days    Minutes of Exercise per Session: 60 min  Stress: No Stress Concern Present (09/18/2021)   [place]    Feeling of Stress : Not at all  Social Connections: Socially Isolated (09/18/2021)   Social Connection and Isolation Panel [NHANES]    Frequency of Communication with Friends and Family: More than three times a week    Frequency of Social Gatherings with Friends and Family: Once a week    Attends Religious Services: Never    Marine scientist or Organizations: No    Attends Archivist Meetings: Never    Marital Status: Widowed  Intimate Partner Violence: Not At Risk (09/18/2021)   Humiliation, Afraid, Rape, and Kick questionnaire    Fear of Current or Ex-Partner: No    Emotionally Abused: No    Physically Abused: No    Sexually Abused: No   Family History  Problem Relation Age of Onset   Stroke Mother        mini strokes   Alcohol abuse Father    Heart disease Father        MI after 30   Stroke Father    Hypertension Father    Heart attack Father    Heart disease Paternal Aunt        several   Hypertension Paternal Aunt        several   Stroke Paternal Aunt        several   Stroke Paternal Uncle        several   Heart disease Paternal Uncle        several;2 had MI pre 37   Cancer Daughter 62       breast ca, also with benign sessile polyp    Colon cancer Daughter 71   Colon polyps Neg Hx    Esophageal cancer Neg Hx    Rectal cancer Neg Hx    Stomach cancer Neg Hx    I have reviewed his medical, social, and family history in detail and updated the electronic medical record as necessary.    PHYSICAL EXAMINATION  BP 118/60   Pulse (!) 59   Ht 6\' 1"  (1.854 m)   Wt 194 lb (88 kg)   BMI 25.60 kg/m  Wt Readings from Last 3 Encounters:  04/04/22 194 lb (88 kg)  02/19/22 193 lb (87.5 kg)  02/12/22 193 lb (87.5 kg)  GEN: NAD, appears stated age, doesn't  appear chronically ill PSYCH: Cooperative, without pressured speech EYE: Conjunctivae pink, sclerae anicteric ENT: MMM CV: Nontachycardic RESP: No audible wheezing GI: NABS, soft, surgical scars present, nontender, nondistended, no rebound MSK/EXT: Trace bilateral lower extremity edema SKIN: No jaundice NEURO:  Alert & Oriented x 3, no focal deficits   REVIEW OF DATA  I reviewed the following data at the time of this encounter:  GI Procedures and Studies  May 2023 ERCP - J-shaped deformity of stomach. A few gastric polyps. Friable gastric mucosa. Stomach biopsied. - No gross mucosal lesions in the duodenal bulb, in the first portion of the duodenum and in the second portion of the duodenum. - The major papilla was small and on the rim of a diverticulum. - Double wire technique used initially but unsuccessful at biliary cannulation. - One temporary plastic pancreatic stent was placed into the ventral pancreatic  duct to decrease PEP but also to aid in biliary cannulation. - Eventually able to access the biliary tree as noted with maneuvers above. - Filling defects consistent with stones were seen on the cholangiogram. - The entire main bile duct was moderately dilated. - Choledocholithiasis was found. Complete removal was accomplished by biliary sphincterotomy and balloon trawl. - There was some active oozing from the sphincterotomy apex site after balloon trawl of the larger CBD stone. Common bile duct was successfully dilated for balloon tamponade with good effect and site monitored without ongoing bleeding.  May 2023 EGD - Normal esophagus. - Multiple gastric polyps. One was actively oozing upon endoscope insertion. Resected with a cold snare then clip closed with one hemostatic clip. - Gastritis. - Non-bleeding duodenal ulcer with a visible vessel located at the major papilla. Injected with epinephrine then treated with a bicap heater probe. - Non-bleeding duodenal  diverticulum.  Laboratory Studies  Reviewed those in epic  Imaging Studies  No new imaging studies to review   ASSESSMENT  Mr. Kazlauskas is a 81 y.o. male with a pmh significant for Colon cancer (status post LAR), colon polyps, diverticulosis, cholelithiasis (s/p ERCP for choledocholithiasis but GB still in place), GIM, chronic renal insufficiency, carotid artery stenosis, history of prior BCC, A. fib (on anticoagulation), BPH, prior lung cancer, sleep apnea, peripheral arterial disease, hypertension, hyperlipidemia, iron deficiency.  The patient is seen today for evaluation and management of:  1. Choledocholithiasis   2. Calculus of gallbladder without cholecystitis without obstruction   3. Bleeding from sphincterotomy site   4. Upper GI bleeding   5. Migration of pancreatic stent, subsequent encounter   6. History of ERCP   7. Anemia, unspecified type   8. Gastric intestinal metaplasia   9. Personal history of colon cancer   10. Hx of adenomatous colonic polyps    The patient is clinically and hemodynamically stable.  He had evidence of obstructing choledocholithiasis with abnormal LFTs which were subsequently removed in May.  Unfortunately he had post ERCP sphincterotomy bleeding that required treatment.  He has done well since his procedure.  He is been referred to Dr. Dema Severin to consider cholecystectomy but he defers on this currently as he does not want to undergo any further surgeries unless something else develops.  He understands the risks of potential recurrent choledocholithiasis even with a sphincterotomy in place  He would be more amenable to considering cholecystectomy if he had another bout of choledocholithiasis however.  We will respect his wishes as he does understand the risks.  He will be due for colon cancer screening/colon polyp surveillance in 2024 and will also undergo gastric mapping for intestinal metaplasia surveillance.  We will recheck his blood counts today to make sure  he is not had a persisting anemia that could require earlier endoscopic reevaluation.  We will also obtain a KUB today to ensure that the previously placed pancreatic stent which was still in place during recent hospitalization has already fallen out.  If it is fallen out, we will cancel the EGD that has been put into the system.  All patient questions were answered to the best of my ability, and the patient agrees to the aforementioned plan of action with follow-up as indicated.   PLAN  CBC and iron indices to be obtained -If evidence of iron deficiency is present then would consider repeat earlier EGD/SBE in hospital-based setting for resection of any remaining gastric polyps that could be a risk for bleeding and consider video capsule  endoscopy Colonoscopy for surveillance in 2024 EGD for GIM surveillance in 2024 LFTs to be obtained Patient defers on cholecystectomy for now but will consider in future Continue current PPI dosing Obtain KUB to ensure pancreas stent has migrated   Orders Placed This Encounter  Procedures   DG Abd 2 Views   CBC with Differential/Platelet   Hepatic function panel   IBC + Ferritin    New Prescriptions   No medications on file   Modified Medications   No medications on file    Planned Follow Up No follow-ups on file.   Total Time in Face-to-Face and in Coordination of Care for patient including independent/personal interpretation/review of prior testing, medical history, examination, medication adjustment, communicating results with the patient directly, and documentation with the EHR is 25 minutes.   Justice Britain, MD [facility] Office # [phone]

## 2022-04-04 NOTE — Patient Instructions (Addendum)
Please go to the x-ray department in the basement level for your abdominal x-ray.  We will contact you tomorrow with the imaging results and if you have your stent, you will have to keep your EGD scheduled for August 10th.  Please have labs drawn on 04/19/22 when you see Dr. Benay Spice.   The [place] providers would like to encourage you to use [facility] to communicate with providers for non-urgent requests or questions.  Due to long hold times on the telephone, sending your provider a message by [facility] may be a faster and more efficient way to get a response.  Please allow 48 business hours for a response.  Please remember that this is for non-urgent requests.   Due to recent changes in healthcare laws, you may see the results of your imaging and laboratory studies on MyChart before your provider has had a chance to review them.  We understand that in some cases there may be results that are confusing or concerning to you. Not all laboratory results come back in the same time frame and the provider may be waiting for multiple results in order to interpret others.  Please give [name] 48 hours in order for your provider to thoroughly review all the results before contacting the office for clarification of your results.

## 2022-04-05 ENCOUNTER — Encounter: Payer: Self-pay | Admitting: Gastroenterology

## 2022-04-06 DIAGNOSIS — D649 Anemia, unspecified: Secondary | ICD-10-CM | POA: Insufficient documentation

## 2022-04-06 DIAGNOSIS — K8050 Calculus of bile duct without cholangitis or cholecystitis without obstruction: Secondary | ICD-10-CM | POA: Insufficient documentation

## 2022-04-06 DIAGNOSIS — K922 Gastrointestinal hemorrhage, unspecified: Secondary | ICD-10-CM | POA: Insufficient documentation

## 2022-04-06 DIAGNOSIS — K91841 Postprocedural hemorrhage and hematoma of a digestive system organ or structure following other procedure: Secondary | ICD-10-CM | POA: Insufficient documentation

## 2022-04-06 DIAGNOSIS — Z9889 Other specified postprocedural states: Secondary | ICD-10-CM | POA: Insufficient documentation

## 2022-04-09 ENCOUNTER — Ambulatory Visit (HOSPITAL_COMMUNITY)
Admission: RE | Admit: 2022-04-09 | Discharge: 2022-04-09 | Disposition: A | Discharge status: Home / Self Care | Payer: PPO | Source: Ambulatory Visit | Attending: Pulmonary Disease | Admitting: Pulmonary Disease

## 2022-04-09 DIAGNOSIS — I700 Atherosclerosis of aorta: Secondary | ICD-10-CM | POA: Diagnosis not present

## 2022-04-09 DIAGNOSIS — R918 Other nonspecific abnormal finding of lung field: Secondary | ICD-10-CM | POA: Diagnosis not present

## 2022-04-09 DIAGNOSIS — I2510 Atherosclerotic heart disease of native coronary artery without angina pectoris: Secondary | ICD-10-CM | POA: Diagnosis not present

## 2022-04-09 DIAGNOSIS — Z902 Acquired absence of lung [part of]: Secondary | ICD-10-CM | POA: Insufficient documentation

## 2022-04-09 DIAGNOSIS — J432 Centrilobular emphysema: Secondary | ICD-10-CM | POA: Diagnosis not present

## 2022-04-09 DIAGNOSIS — J439 Emphysema, unspecified: Secondary | ICD-10-CM | POA: Diagnosis not present

## 2022-04-09 DIAGNOSIS — J90 Pleural effusion, not elsewhere classified: Secondary | ICD-10-CM | POA: Diagnosis not present

## 2022-04-09 DIAGNOSIS — R911 Solitary pulmonary nodule: Secondary | ICD-10-CM | POA: Insufficient documentation

## 2022-04-12 ENCOUNTER — Ambulatory Visit: Payer: PPO | Admitting: Nurse Practitioner

## 2022-04-12 ENCOUNTER — Encounter: Payer: Self-pay | Admitting: Nurse Practitioner

## 2022-04-12 VITALS — BP 126/62 | HR 63 | Temp 98.2°F | Ht 73.0 in | Wt 194.2 lb

## 2022-04-12 DIAGNOSIS — R911 Solitary pulmonary nodule: Secondary | ICD-10-CM

## 2022-04-12 DIAGNOSIS — J449 Chronic obstructive pulmonary disease, unspecified: Secondary | ICD-10-CM

## 2022-04-12 DIAGNOSIS — J90 Pleural effusion, not elsewhere classified: Secondary | ICD-10-CM

## 2022-04-12 DIAGNOSIS — C3491 Malignant neoplasm of unspecified part of right bronchus or lung: Secondary | ICD-10-CM

## 2022-04-12 NOTE — Progress Notes (Signed)
@Patient  ID: Troy Kindred [name], male    DOB: 07-23-1941, 81 y.o.   MRN: [id number]  Chief Complaint  Patient presents with   Follow-up    CT results. Still having SOB with exertion. No new issues.    Referring provider: Binnie Rail, MD  HPI: 81 year old male, former smoker followed for COPD, lung nodule, and pleural effusion.  He is a patient of Dr. Juline Patch and last seen in office 10/13/2021.  Past medical history significant for PVD, AAA without rupture, PAD, TIA, PAF on Eliquis, hypertension, CAD, CHF, allergic rhinitis, mild OSA, CKD, MGUS, depression, IDA.  He also has a history of squamous cell carcinoma status post right lower lobectomy and colon cancer.  He is followed by medical oncology with Dr. Benay Spice.  TEST/EVENTS:  04/09/2022 super D chest: Heart size is borderline enlarged.  There is atherosclerosis present.  No LAD is present.  Status post right lower lobectomy.  There is compensatory hyperextension of the right middle and upper lobes.  There is increased nodular thickening along the minor fissure.  There are more discrete areas of nodularity inferiorly measuring 2.3 x 2.1 and 1.4 x 1.1 cm, both appear slightly more prominent than prior exam.  Additionally in the inferior aspect of the right upper lobe just above the minor fissure there is an increasingly masslike appearing region with internal air bronchograms measuring 5.1 x 3 cm.  There is a small chronic right-sided pleural effusion with chronic pleural thickening which is stable.  There are multiple tiny 1 to 3 mm micronodules in the periphery of the left lung, similar to the prior study, likely benign mucoid impaction.  There is mild diffuse bronchial wall thickening with mild centrilobular and paraseptal emphysema.  10/13/2021: OV with Dr. Valeta Harms for follow-up after recent noncontrasted CT imaging of the chest for lung nodules.  He has a right sided lower lobe area that looks stable.  Adenopathy had decreased in size.   There is also a persistent right-sided pleural effusion that appears stable; effusion was previously tapped and was negative for malignancy.  Plans for repeat CT in 6 months.  Continued on Breztri and prn albuterol for COPD.   04/12/2022: Today - follow up Patient presents today for follow up to discuss CT scan results. His breathing has been stable and he has not had any exacerbations requiring prednisone or abx in well over a year. He really only gets winded with climbing or strenuous activity; otherwise, he feels well controlled on the [facility] inhaler. Rarely uses his albuterol. He denies any cough, wheezing, hemoptysis, weight loss, anorexia, fevers, night sweats. He did have problems with GI bleed in May but is doing much better and back on his Eliquis.   Allergies  Allergen Reactions   Niacin-Lovastatin Er Shortness Of Breath and Other (See Comments)    Dyspnea, flushing   Penicillins Hives, Shortness Of Breath and Other (See Comments)    Flushing  & dyspnea Because of a history of documented adverse serious drug reaction;Medi Alert bracelet  is recommended PCN reaction causing immediate rash, facial/tongue/throat swelling, SOB or lightheadedness with hypotension: Yes PCN reaction causing severe rash involving mucus membranes or skin necrosis: No PCN reaction occurring within the last 10 years: NO PCN reaction that required hospitalization: NO   Sulfonamide Derivatives Hives, Shortness Of Breath and Other (See Comments)    Flushing & dyspnea Because of a history of documented adverse serious drug reaction;Medi Alert bracelet  is recommended   Atorvastatin Other (See Comments)  Myalgias & athralgias- takes Crestor, DOES NOT LIKE LIPITOR    Immunization History  Administered Date(s) Administered   Fluad Quad(high Dose 65+) 08/11/2020   Influenza Split 07/05/2017   Influenza Whole 07/28/2010   Influenza,inj,Quad PF,6+ Mos 07/07/2013, 07/30/2014   Influenza-Unspecified 07/02/2015,  07/01/2021   PFIZER(Purple Top)SARS-COV-2 Vaccination 10/18/2019, 11/08/2019, 06/14/2020, 04/14/2021   Pneumococcal Conjugate-13 11/28/2015   Pneumococcal Polysaccharide-23 10/02/2007   Td 03/14/2010   Tdap 02/12/2022    Past Medical History:  Diagnosis Date   AAA (abdominal aortic aneurysm) ([place]) LAST ABDOMINAL US 10-20-17 3.3 CM   MONITORED BY DR [name]   Anxiety    Arthritis    Atrial fibrillation (HCC)    Basal cell carcinoma    BPH (benign prostatic hypertrophy)    Chronic diastolic heart failure (HCC)    Chronic kidney disease    stage 3, pt unaware   Colon cancer ([place])    COPD (chronic obstructive pulmonary disease) ([place])    Depression    GERD (gastroesophageal reflux disease)    Glaucoma BOTH EYES   Dr Gershon Crane   History of lung cancer APRIL 2012  SQUAMOUS CELL---- S/P RIGHT LOWER LOBECTOMY AT [facility] --  NO CHEMORADIATION---  NO RECURRENCE    ONCOLOGIST- DR Tressie Stalker  LOV IN [facility] 10-27-2012   History of pneumothorax    pt unaware   Hx of adenomatous colonic polyps 2005    X 2; 1 hyperplastic polyp; Dr Olevia Perches   Hyperlipidemia    Hypertension    Impaired fasting glucose 2007   108; A1c5.4%   OSA (obstructive sleep apnea) 08/29/2015   CPAP SET ON 10   Peripheral vascular disease (HCC) S/P ANGIOPLASTY AND STENTING   FOLLOWED  BY DR DICKSON   Spinal stenosis 06/2014   Status post placement of implantable loop recorder    Thoracic aorta atherosclerosis ([place])    Thyrotoxicosis    amiodarone induced    Tobacco History: Social History   Tobacco Use  Smoking Status Former   Packs/day: 0.50   Years: 50.00   Total pack years: 25.00   Types: Cigarettes   Quit date: 07/28/2014   Years since quitting: 7.7  Smokeless Tobacco Never   Counseling given: Not Answered   Outpatient Medications Prior to Visit  Medication Sig Dispense Refill   acetaminophen (TYLENOL) 325 MG tablet Take 650 mg by mouth every 6 (six) hours as needed for moderate pain or headache.      albuterol (VENTOLIN HFA) 108 (90 Base) MCG/ACT inhaler Inhale 2 puffs into the lungs every 6 (six) hours as needed for wheezing or shortness of breath. 8.5 g 0   amLODipine (NORVASC) 5 MG tablet TAKE ONE TABLET BY MOUTH EVERY DAY 60 tablet 2   apixaban (ELIQUIS) 5 MG TABS tablet Take 1 tablet (5 mg total) by mouth 2 (two) times daily. 60 tablet 6   Brinzolamide-Brimonidine (SIMBRINZA) 1-0.2 % SUSP Apply 1 drop to eye in the morning, at noon, and at bedtime.     Budeson-Glycopyrrol-Formoterol (BREZTRI AEROSPHERE) 160-9-4.8 MCG/ACT AERO Inhale 2 puffs into the lungs in the morning and at bedtime. 10.7 g 0   docusate sodium (COLACE) 100 MG capsule Take 100 mg by mouth 2 (two) times daily.      escitalopram (LEXAPRO) 10 MG tablet TAKE ONE TABLET BY MOUTH EVERY DAY 28 tablet 5   furosemide (LASIX) 20 MG tablet Take 3 tablets (60 mg total) by mouth 2 (two) times daily. 180 tablet 3   JARDIANCE 10 MG TABS tablet  TAKE ONE TABLET BY MOUTH EVERY DAY BEFORE BREAKFAST. 30 tablet 11   latanoprost (XALATAN) 0.005 % ophthalmic solution Place 1 drop into both eyes at bedtime.     losartan (COZAAR) 25 MG tablet TAKE ONE TABLET BY MOUTH EVERY DAY 90 tablet 3   Melatonin 10 MG TABS Take 20 mg by mouth at bedtime.     metoprolol succinate (TOPROL-XL) 25 MG 24 hr tablet TAKE 1/2 TABLET EVERY EVENING WITH OR IMMEDIATELY FOLLOWING A MEAL 45 tablet 3   montelukast (SINGULAIR) 10 MG tablet TAKE ONE TABLET BY MOUTH AT BEDTIME 28 tablet 5   Multiple Vitamins-Minerals (CENTRUM SILVER PO) Take 1 tablet by mouth daily.     ondansetron (ZOFRAN) 4 MG tablet Take 1 tablet (4 mg total) by mouth every 6 (six) hours as needed for nausea. 20 tablet 0   OVER THE COUNTER MEDICATION Take 3 capsules by mouth daily. Balance of Nature Fruits     OVER THE COUNTER MEDICATION Take 3 capsules by mouth daily. Balance of Nature Vegetables     pantoprazole (PROTONIX) 40 MG tablet Take 1 tablet (40 mg total) by mouth 2 (two) times daily. 60 tablet  0   potassium chloride SA (KLOR-CON M) 20 MEQ tablet Take 2 tablets (40 mEq total) by mouth every morning AND 1 tablet (20 mEq total) every evening. 90 tablet 6   rosuvastatin (CRESTOR) 40 MG tablet TAKE ONE TABLET BY MOUTH EVERY DAY 90 tablet 3   senna (SENOKOT) 8.6 MG tablet Take 1 tablet by mouth 2 (two) times daily.     vitamin B-12 (CYANOCOBALAMIN) 1000 MCG tablet Take 1 tablet (1,000 mcg total) by mouth daily. 30 tablet 0   No facility-administered medications prior to visit.     Review of Systems:   Constitutional: No weight loss or gain, night sweats, fevers, chills, fatigue, or lassitude. HEENT: No headaches, difficulty swallowing, tooth/dental problems, or sore throat. No sneezing, itching, ear ache, nasal congestion, or post nasal drip.  CV:  No chest pain, orthopnea, PND, swelling in lower extremities, anasarca, dizziness, palpitations, syncope Resp: +shortness of breath with exertion (stable). No excess mucus or change in color of mucus. No productive or non-productive. No hemoptysis. No wheezing.  No chest wall deformity GI:  No heartburn, indigestion, abdominal pain, nausea, vomiting, diarrhea, change in bowel habits, loss of appetite, bloody stools.  Skin: No rash, lesions, ulcerations MSK:  No joint pain or swelling.  No decreased range of motion.  No back pain. Neuro: No dizziness or lightheadedness.  Psych: No depression or anxiety. Mood stable.     Physical Exam:  BP 126/62 (BP Location: Right Arm, Patient Position: Sitting, Cuff Size: Normal)   Pulse 63   Temp 98.2 F (36.8 C) (Oral)   Ht 6\' 1"  (1.854 m)   Wt 194 lb 3.2 oz (88.1 kg)   SpO2 96%   BMI 25.62 kg/m   GEN: Pleasant, interactive, well-appearing; in no acute distress. HEENT:  Normocephalic and atraumatic. PERRLA. Sclera white. Nasal turbinates pink, moist and patent bilaterally. No rhinorrhea present. Oropharynx pink and moist, without exudate or edema. No lesions, ulcerations, or postnasal drip.   NECK:  Supple w/ fair ROM. No JVD present. No lymphadenopathy.   CV: RRR, no m/r/g, no peripheral edema. Pulses intact, +2 bilaterally. No cyanosis, pallor or clubbing. PULMONARY:  Unlabored, regular breathing. Diminished bases bilaterally A&P w/o wheezes/rales/rhonchi. No accessory muscle use. No dullness to percussion. GI: BS present and normoactive. Soft, non-tender to palpation.  MSK: No  erythema, warmth or tenderness. Cap refil <2 sec all extrem. No deformities or joint swelling noted.  Neuro: A/Ox3. No focal deficits noted.   Skin: Warm, no lesions or rashe Psych: Normal affect and behavior. Judgement and thought content appropriate.     Lab Results:  CBC    Component Value Date/Time   WBC 4.3 04/04/2022 1045   RBC 4.07 (L) 04/04/2022 1045   HGB 12.0 (L) 04/04/2022 1045   HGB 14.3 02/09/2021 1136   HGB 12.4 (L) 05/16/2020 1615   HGB 11.8 (L) 03/07/2016 1213   HCT 36.7 (L) 04/04/2022 1045   HCT 37.9 05/16/2020 1615   HCT 36.3 (L) 03/07/2016 1213   PLT 284.0 04/04/2022 1045   PLT 248 02/09/2021 1136   PLT 329 05/16/2020 1615   MCV 90.1 04/04/2022 1045   MCV 94 05/16/2020 1615   MCV 85.0 03/07/2016 1213   MCH 29.7 02/16/2022 0141   MCHC 32.6 04/04/2022 1045   RDW 18.8 (H) 04/04/2022 1045   RDW 14.2 05/16/2020 1615   RDW 14.7 (H) 03/07/2016 1213   LYMPHSABS 0.7 04/04/2022 1045   LYMPHSABS 0.9 05/16/2020 1615   LYMPHSABS 0.8 (L) 03/07/2016 1213   MONOABS 0.2 04/04/2022 1045   MONOABS 0.8 03/07/2016 1213   EOSABS 0.2 04/04/2022 1045   EOSABS 0.1 05/16/2020 1615   BASOSABS 0.0 04/04/2022 1045   BASOSABS 0.0 05/16/2020 1615   BASOSABS 0.0 03/07/2016 1213    BMET    Component Value Date/Time   NA 138 02/16/2022 0141   NA 142 05/16/2020 1615   NA 142 03/07/2016 1213   K 3.2 (L) 02/16/2022 0141   K 4.5 03/07/2016 1213   CL 104 02/16/2022 0141   CO2 24 02/16/2022 0141   CO2 25 03/07/2016 1213   GLUCOSE 155 (H) 02/16/2022 0141   GLUCOSE 85 03/07/2016 1213   BUN  21 02/16/2022 0141   BUN 17 05/16/2020 1615   BUN 12.5 03/07/2016 1213   CREATININE 1.49 (H) 02/16/2022 0141   CREATININE 1.38 (H) 02/09/2021 1136   CREATININE 1.62 (H) 09/20/2016 1141   CREATININE 1.2 03/07/2016 1213   CALCIUM 8.6 (L) 02/16/2022 0141   CALCIUM 9.8 03/07/2016 1213   GFRNONAA 47 (L) 02/16/2022 0141   GFRNONAA 52 (L) 02/09/2021 1136   GFRAA 74 05/16/2020 1615   GFRAA >60 01/14/2020 1001    BNP    Component Value Date/Time   BNP 184.4 (H) 11/15/2021 1258   BNP 42.6 09/13/2016 1700     Imaging:  CT Super D Chest Wo Contrast  Result Date: 04/11/2022 CLINICAL DATA:  81 year old male history of COPD. Hypertension. Status post right lower lobectomy in 2012 for history of squamous cell carcinoma. * Tracking Code: BO * EXAM: CT CHEST WITHOUT CONTRAST TECHNIQUE: Multidetector CT imaging of the chest was performed using thin slice collimation for electromagnetic bronchoscopy planning purposes, without intravenous contrast. RADIATION DOSE REDUCTION: This exam was performed according to the departmental dose-optimization program which includes automated exposure control, adjustment of the mA and/or kV according to patient size and/or use of iterative reconstruction technique. COMPARISON:  Chest CT 09/12/2021. FINDINGS: Cardiovascular: Heart size is borderline enlarged. There is no significant pericardial fluid, thickening or pericardial calcification. There is aortic atherosclerosis, as well as atherosclerosis of the great vessels of the mediastinum and the coronary arteries, including calcified atherosclerotic plaque in the left main, left anterior descending, left circumflex and right coronary arteries. Mediastinum/Nodes: No pathologically enlarged mediastinal or hilar lymph nodes. Please note that accurate exclusion of  hilar adenopathy is limited on noncontrast CT scans. Esophagus is unremarkable in appearance. No axillary lymphadenopathy. Lungs/Pleura: Status post right lower  lobectomy. Compensatory hyperexpansion of the right middle and upper lobes. Increased nodular thickening along the minor fissure, most pronounced on image 86 of series 5. More inferiorly there are more discrete areas of nodularity measuring 2.3 x 1.2 cm (axial image 92 of series 5), and 1.4 x 1.1 cm (axial image 96 of series 5), both of which appear slightly more prominent than the prior examination. Additionally, in the inferior aspect of the right upper lobe just above the minor fissure (axial image 83 of series 5) there is an increasingly mass-like appearing region with internal air bronchograms measuring 5.1 x 3.0 cm. Small chronic right-sided pleural effusion with chronic pleural thickening, stable compared to the prior examination. No left pleural effusion. Multiple tiny 1-3 mm micro nodules in the periphery of the left lung, similar to the prior study, nonspecific, but favored to reflect areas of benign mucoid impaction within terminal bronchioles. Mild diffuse bronchial wall thickening with mild centrilobular and paraseptal emphysema. Upper Abdomen: Aortic atherosclerosis. Pneumobilia, presumably from prior sphincterotomy. Musculoskeletal: There are no aggressive appearing lytic or blastic lesions noted in the visualized portions of the skeleton. IMPRESSION: 1. Status post right lower lobectomy with increased soft tissue thickening and nodularity in the right lung, as well as an increasingly mass-like appearing area in the inferior right upper lobe, as detailed above. The possibility of recurrent neoplasm should be strongly considered, and further evaluation with PET-CT is suggested in the near future to better evaluate these findings. 2. Mild diffuse bronchial wall thickening with mild centrilobular and paraseptal emphysema; imaging findings suggestive of underlying COPD. 3. Aortic atherosclerosis, in addition to left main and three-vessel coronary artery disease. Aortic Atherosclerosis (ICD10-I70.0) and  Emphysema (ICD10-J43.9). Electronically Signed   By: Vinnie Langton M.D.   On: 04/11/2022 06:47   DG Abd 2 Views  Result Date: 04/04/2022 CLINICAL DATA:  Pancreatic stent placement EXAM: ABDOMEN - 2 VIEW COMPARISON:  KUB 02/23/2019 FINDINGS: The previously seen pancreatic stent is no longer seen. There is a nonobstructive bowel gas pattern. A surgical clip in the left upper quadrant is unchanged. There is no definite free intraperitoneal air. A right pleural effusion is again seen. There is no acute osseous abnormality. IMPRESSION: 1. The pancreatic stent is no longer identified. 2. Persistent right pleural effusion. Electronically Signed   By: Valetta Mole M.D.   On: 04/04/2022 13:03   CUP PACEART REMOTE DEVICE CHECK  Result Date: 04/03/2022 ILR summary report received. Battery status OK. Normal device function. No new symptom, tachy, brady, or pause episodes. There were 36 AF episodes, on OAC according to previous reports.  AF burden is 1.2% of the time, this is not a new finding.   Monthly  summary reports and ROV/PRN Kathy Breach, RN, CCDS, [employer]        Latest Ref Rng & Units 08/13/2017    3:00 PM 10/04/2016   11:03 AM  PFT Results  FVC-Pre L 3.10  3.14   FVC-Predicted Pre % 67  67   FVC-Post L  3.26   FVC-Predicted Post %  70   Pre FEV1/FVC % % 64  62   Post FEV1/FCV % %  64   FEV1-Pre L 1.98  1.96   FEV1-Predicted Pre % 59  58   FEV1-Post L  2.10   DLCO uncorrected ml/min/mmHg 12.40  14.56   DLCO UNC% %  34  40   DLCO corrected ml/min/mmHg  14.44   DLCO COR %Predicted %  40   DLVA Predicted % 56  59   TLC L 7.39  5.77   TLC % Predicted % 97  76   RV % Predicted % 156  92     No results found for: "NITRICOXIDE"      Assessment & Plan:   Lung nodule Repeat super D chest with more prominent RUL nodules as well as a masslike appearing region in the RUL measuring 5.1x3 cm, which is new. Concerning given his hx of squamous cell lung cancer. We will obtain PET  scan for further evaluation. If there is evidence of hypermetabolism, he will need tissue biopsy. I have notified Dr.Sherrill of these findings as well.   Patient Instructions  Continue Albuterol inhaler 2 puffs every 6 hours as needed for shortness of breath or wheezing. Notify if symptoms persist despite rescue inhaler/neb use.  Continue Breztri 2 puffs Twice daily. Brush tongue and rinse mouth afterwards.  Continue singulair 10 mg At bedtime   PET scan ordered today for further evaluation - someone will contact you for scheduling  Follow up after PET scan in 3-4 weeks with Dr. Valeta Harms or Eric Form, NP to discuss next step. If symptoms do not improve or worsen, please contact office for sooner follow up or seek emergency care.     Squamous cell lung cancer (HCC) S/p RL lobectomy. No radiation or chemo. See above findings on CT. Follow up with Dr. Benay Spice scheduled for next week.   COPD GOLD II  Appears compensated on current regimen with stable respiratory symptoms. Continue triple therapy with Breztri and PRN albuterol.   Pleural effusion on right Small, chronic. Stable on repeat imaging.    I spent 32 minutes of dedicated to the care of this patient on the date of this encounter to include pre-visit review of records, face-to-face time with the patient discussing conditions above, post visit ordering of testing, clinical documentation with the electronic health record, making appropriate referrals as documented, and communicating necessary findings to members of the patients care team.  Clayton Bibles, NP 04/12/2022  Pt aware and understands NP's role.

## 2022-04-12 NOTE — Assessment & Plan Note (Signed)
Appears compensated on current regimen with stable respiratory symptoms. Continue triple therapy with Breztri and PRN albuterol.

## 2022-04-12 NOTE — Assessment & Plan Note (Signed)
Repeat super D chest with more prominent RUL nodules as well as a masslike appearing region in the RUL measuring 5.1x3 cm, which is new. Concerning given his hx of squamous cell lung cancer. We will obtain PET scan for further evaluation. If there is evidence of hypermetabolism, he will need tissue biopsy. I have notified Dr.Sherrill of these findings as well.   Patient Instructions  Continue Albuterol inhaler 2 puffs every 6 hours as needed for shortness of breath or wheezing. Notify if symptoms persist despite rescue inhaler/neb use.  Continue Breztri 2 puffs Twice daily. Brush tongue and rinse mouth afterwards.  Continue singulair 10 mg At bedtime   PET scan ordered today for further evaluation - someone will contact you for scheduling  Follow up after PET scan in 3-4 weeks with Dr. Valeta Harms or Eric Form, NP to discuss next step. If symptoms do not improve or worsen, please contact office for sooner follow up or seek emergency care.

## 2022-04-12 NOTE — Assessment & Plan Note (Signed)
Small, chronic. Stable on repeat imaging.

## 2022-04-12 NOTE — Patient Instructions (Signed)
Continue Albuterol inhaler 2 puffs every 6 hours as needed for shortness of breath or wheezing. Notify if symptoms persist despite rescue inhaler/neb use.  Continue Breztri 2 puffs Twice daily. Brush tongue and rinse mouth afterwards.  Continue singulair 10 mg At bedtime   PET scan ordered today for further evaluation - someone will contact you for scheduling  Follow up after PET scan in 3-4 weeks with Dr. Valeta Harms or Eric Form, NP to discuss next step. If symptoms do not improve or worsen, please contact office for sooner follow up or seek emergency care.

## 2022-04-12 NOTE — Assessment & Plan Note (Signed)
S/p RL lobectomy. No radiation or chemo. See above findings on CT. Follow up with Dr. Benay Spice scheduled for next week.

## 2022-04-16 DIAGNOSIS — F32A Depression, unspecified: Secondary | ICD-10-CM | POA: Diagnosis not present

## 2022-04-16 DIAGNOSIS — C189 Malignant neoplasm of colon, unspecified: Secondary | ICD-10-CM | POA: Diagnosis not present

## 2022-04-16 DIAGNOSIS — N400 Enlarged prostate without lower urinary tract symptoms: Secondary | ICD-10-CM | POA: Diagnosis not present

## 2022-04-16 DIAGNOSIS — D62 Acute posthemorrhagic anemia: Secondary | ICD-10-CM | POA: Diagnosis not present

## 2022-04-16 DIAGNOSIS — E1122 Type 2 diabetes mellitus with diabetic chronic kidney disease: Secondary | ICD-10-CM | POA: Diagnosis not present

## 2022-04-16 DIAGNOSIS — F419 Anxiety disorder, unspecified: Secondary | ICD-10-CM | POA: Diagnosis not present

## 2022-04-16 DIAGNOSIS — Z7984 Long term (current) use of oral hypoglycemic drugs: Secondary | ICD-10-CM | POA: Diagnosis not present

## 2022-04-16 DIAGNOSIS — E785 Hyperlipidemia, unspecified: Secondary | ICD-10-CM | POA: Diagnosis not present

## 2022-04-16 DIAGNOSIS — I5032 Chronic diastolic (congestive) heart failure: Secondary | ICD-10-CM | POA: Diagnosis not present

## 2022-04-16 DIAGNOSIS — I480 Paroxysmal atrial fibrillation: Secondary | ICD-10-CM | POA: Diagnosis not present

## 2022-04-16 DIAGNOSIS — G4733 Obstructive sleep apnea (adult) (pediatric): Secondary | ICD-10-CM | POA: Diagnosis not present

## 2022-04-16 DIAGNOSIS — I700 Atherosclerosis of aorta: Secondary | ICD-10-CM | POA: Diagnosis not present

## 2022-04-16 DIAGNOSIS — Z87891 Personal history of nicotine dependence: Secondary | ICD-10-CM | POA: Diagnosis not present

## 2022-04-16 DIAGNOSIS — Z9181 History of falling: Secondary | ICD-10-CM | POA: Diagnosis not present

## 2022-04-16 DIAGNOSIS — M4800 Spinal stenosis, site unspecified: Secondary | ICD-10-CM | POA: Diagnosis not present

## 2022-04-16 DIAGNOSIS — K219 Gastro-esophageal reflux disease without esophagitis: Secondary | ICD-10-CM | POA: Diagnosis not present

## 2022-04-16 DIAGNOSIS — F39 Unspecified mood [affective] disorder: Secondary | ICD-10-CM | POA: Diagnosis not present

## 2022-04-16 DIAGNOSIS — I714 Abdominal aortic aneurysm, without rupture, unspecified: Secondary | ICD-10-CM | POA: Diagnosis not present

## 2022-04-16 DIAGNOSIS — H409 Unspecified glaucoma: Secondary | ICD-10-CM | POA: Diagnosis not present

## 2022-04-16 DIAGNOSIS — M1990 Unspecified osteoarthritis, unspecified site: Secondary | ICD-10-CM | POA: Diagnosis not present

## 2022-04-16 DIAGNOSIS — H547 Unspecified visual loss: Secondary | ICD-10-CM | POA: Diagnosis not present

## 2022-04-16 DIAGNOSIS — I130 Hypertensive heart and chronic kidney disease with heart failure and stage 1 through stage 4 chronic kidney disease, or unspecified chronic kidney disease: Secondary | ICD-10-CM | POA: Diagnosis not present

## 2022-04-16 DIAGNOSIS — E1151 Type 2 diabetes mellitus with diabetic peripheral angiopathy without gangrene: Secondary | ICD-10-CM | POA: Diagnosis not present

## 2022-04-16 DIAGNOSIS — N1832 Chronic kidney disease, stage 3b: Secondary | ICD-10-CM | POA: Diagnosis not present

## 2022-04-19 ENCOUNTER — Inpatient Hospital Stay: Payer: PPO | Admitting: Oncology

## 2022-04-19 ENCOUNTER — Inpatient Hospital Stay: Payer: PPO | Attending: Oncology

## 2022-04-19 VITALS — BP 148/64 | HR 64 | Temp 98.1°F | Resp 18 | Wt 195.6 lb

## 2022-04-19 DIAGNOSIS — I4891 Unspecified atrial fibrillation: Secondary | ICD-10-CM | POA: Diagnosis not present

## 2022-04-19 DIAGNOSIS — Z85118 Personal history of other malignant neoplasm of bronchus and lung: Secondary | ICD-10-CM | POA: Diagnosis not present

## 2022-04-19 DIAGNOSIS — Z862 Personal history of diseases of the blood and blood-forming organs and certain disorders involving the immune mechanism: Secondary | ICD-10-CM | POA: Diagnosis not present

## 2022-04-19 DIAGNOSIS — C187 Malignant neoplasm of sigmoid colon: Secondary | ICD-10-CM | POA: Insufficient documentation

## 2022-04-19 DIAGNOSIS — Z7901 Long term (current) use of anticoagulants: Secondary | ICD-10-CM | POA: Insufficient documentation

## 2022-04-19 DIAGNOSIS — J90 Pleural effusion, not elsewhere classified: Secondary | ICD-10-CM | POA: Insufficient documentation

## 2022-04-19 DIAGNOSIS — J449 Chronic obstructive pulmonary disease, unspecified: Secondary | ICD-10-CM | POA: Insufficient documentation

## 2022-04-19 DIAGNOSIS — C182 Malignant neoplasm of ascending colon: Secondary | ICD-10-CM | POA: Diagnosis present

## 2022-04-19 LAB — CEA (ACCESS): CEA (CHCC): 2.35 ng/mL (ref 0.00–5.00)

## 2022-04-19 NOTE — Progress Notes (Signed)
[place] OFFICE PROGRESS NOTE   Diagnosis: Colon cancer, lung cancer  INTERVAL HISTORY:   Troy [name] returns as scheduled.  Good appetite.  He is exercising.  He has dyspnea.  He underwent a chest super D CT on 04/09/2022.  Increased soft tissue thickening and nodularity was noted in the right lung with an increasing masslike area in the inferior right upper lobe.  He is scheduled for a PET scan 04/23/2022.  Objective:  Vital signs in last 24 hours:  Blood pressure (!) 148/64, pulse 64, temperature 98.1 F (36.7 C), temperature source Oral, resp. rate 18, weight 195 lb 9.6 oz (88.7 kg), SpO2 94 %.     Lymphatics: No cervical, supraclavicular, axillary, or inguinal nodes Resp: Decreased breath sounds at the right lower posterior chest, no respiratory distress Cardio: Irregular GI: No hepatosplenomegaly Vascular: Left lower leg is larger than the right side Skin: Right chest surgical site without evidence of recurrent tumor   Lab Results:  Lab Results  Component Value Date   WBC 4.3 04/04/2022   HGB 12.0 (L) 04/04/2022   HCT 36.7 (L) 04/04/2022   MCV 90.1 04/04/2022   PLT 284.0 04/04/2022   NEUTROABS 3.2 04/04/2022    CMP  Lab Results  Component Value Date   NA 138 02/16/2022   K 3.2 (L) 02/16/2022   CL 104 02/16/2022   CO2 24 02/16/2022   GLUCOSE 155 (H) 02/16/2022   BUN 21 02/16/2022   CREATININE 1.49 (H) 02/16/2022   CALCIUM 8.6 (L) 02/16/2022   PROT 6.7 04/04/2022   ALBUMIN 4.2 04/04/2022   AST 15 04/04/2022   ALT 13 04/04/2022   ALKPHOS 76 04/04/2022   BILITOT 0.7 04/04/2022   GFRNONAA 47 (L) 02/16/2022   GFRAA 74 05/16/2020    Lab Results  Component Value Date   CEA1 1.19 06/19/2021   CEA 1.88 10/19/2021    Lab Results  Component Value Date   INR 1.8 (H) 07/01/2021   LABPROT 21.2 (H) 07/01/2021    Imaging:  No results found.  Medications: I have reviewed the patient's current  medications.   Assessment/Plan: 1.Adenocarcinoma of sigmoid colon-stage II (T3N0)  Colonoscopy 06/25/2018- multiple polyps in the ascending and descending colon, partially obstructing mass in the sigmoid colon-biopsy confirmed adenocarcinoma CTs 06/25/2018-enlarging right paratracheal lymph node compared to November 2018, no other evidence of metastatic disease Low anterior resection 12/12/2018, stage II (T3N0) adenocarcinoma the sigmoid colon, no loss of mismatch repair protein expression, MSI-stable Colonoscopy 06/23/2020-polyps removed from the transverse colon, ascending colon, and cecum-tubular adenomas  CT chest 01/14/2020-stable mildly enlarged high right paratracheal node and right hilar node CT abdomen/pelvis 01/29/2020-no evidence of recurrent disease, stable right lower lobe nodule CT chest 01/24/2021-mild growth of 2 irregular basilar right upper lobe nodules, stable high right mediastinal node PET scan 02/08/2021- band of nodules in the right upper lobe including a 13 mm nodule with an SUV of 3.2 and a 13 mm nodule with an SUV of 1.4.,  12 mm nodule in the upper right mediastinum with an SUV of 3.5     2.  History of iron deficiency anemia Secondary to bleeding from tumor, exacerbated by anticoagulation with eliquis  3. 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 Elenor [facility]ones at [facility]. Last Ct chest 05/01/18 stable, without concerning evidence of recurrence  -CT 06/25/2018- nonspecific small bilateral nodules, enlarging superior right paratracheal node -EUS biopsy of the right paratracheal nodes (several rounded nodes measuring 4 cm in total) on 07/10/2018- negative for malignancy -CT chest 03/03/2019-  stable postoperative appearance of the chest, unchanged mildly prominent right paratracheal lymph node, no evidence of metastatic disease -CT chest 01/14/2020-stable surgical changes in the right chest, stable mild mediastinal and right hilar adenopathy -CT chest 01/24/2021- mild interval growth of irregular nodular opacities in the right upper lobe along the minor fissure -PET 11/12/9415-EYCX of hypermetabolic nodules in the inferior aspect of the right upper lobe-indeterminate, nodularity increased over time.  Hypermetabolic nodule in the right upper mediastinum, similar small right effusion -CT super D chest 03/21/2021-consolidation and nodularity in the posterior inferior right upper lobe slightly diminished, unchanged loculated small right effusion, fine clustered centrilobular nodularity concentrated in the left upper lobe-unchanged.  No enlarged mediastinal, hilar, or axillary nodes -Right pleural fluid 07/05/2021-reactive mesothelial cells -CT super D chest 09/12/2021-decrease in size of right paratracheal and left mediastinal nodes, stable small right pleural effusion, right lower lobectomy, similar soft tissue thickening in the inferior right upper lobe, upper lobe predominant centrilobular nodularity favored to represent bronchiolitis -CT super D chest 04/09/2022-increased nodular thickening at the minor fissure, increased masslike density in the inferior right upper lobe, stable right pleural effusion    4. COPD 5. Atrial fibrillation 6. Thyrotoxicosis-potentially amiodarone related 7.  Multiple gastric polyps on endoscopy 06/23/2020-1 oozing polyp-hyperplastic polyp, biopsy from the gastric antrum revealed intestinal metaplasia        Disposition: Troy [name] remains in clinical remission from colon cancer.  He has a history of non-small cell lung cancer.  I reviewed the July CT findings and images with him.  There are areas of increased nodular thickening at the minor fissure we discussed  the differential diagnosis.  He is scheduled for a PET scan next week.  He will undergo a bronchoscopic biopsy if there is a suspicious area on the PET.  Troy*will return for an office visit during the week of 05/07/2022.  We will follow-up on the CEA from today.  Betsy Coder, MD  04/19/2022  4:11 PM

## 2022-04-20 ENCOUNTER — Telehealth: Payer: Self-pay

## 2022-04-20 NOTE — Telephone Encounter (Signed)
-----   Message from Ladell Pier, MD sent at 04/19/2022  5:44 PM EDT ----- Please call patient, the CEA is normal, follow-up as scheduled

## 2022-04-20 NOTE — Telephone Encounter (Signed)
Pt verbalized understanding.

## 2022-04-23 ENCOUNTER — Encounter (HOSPITAL_COMMUNITY)
Admission: RE | Admit: 2022-04-23 | Discharge: 2022-04-23 | Disposition: A | Discharge status: Home / Self Care | Payer: PPO | Source: Ambulatory Visit | Attending: Nurse Practitioner | Admitting: Nurse Practitioner

## 2022-04-23 DIAGNOSIS — R911 Solitary pulmonary nodule: Secondary | ICD-10-CM | POA: Insufficient documentation

## 2022-04-23 NOTE — Progress Notes (Signed)
Carelink Summary Report / Loop Recorder 

## 2022-04-24 ENCOUNTER — Ambulatory Visit (HOSPITAL_COMMUNITY)
Admission: RE | Admit: 2022-04-24 | Discharge: 2022-04-24 | Disposition: A | Discharge status: Home / Self Care | Payer: PPO | Source: Ambulatory Visit | Attending: Nurse Practitioner | Admitting: Nurse Practitioner

## 2022-04-24 DIAGNOSIS — Z85118 Personal history of other malignant neoplasm of bronchus and lung: Secondary | ICD-10-CM | POA: Insufficient documentation

## 2022-04-24 DIAGNOSIS — I739 Peripheral vascular disease, unspecified: Secondary | ICD-10-CM | POA: Insufficient documentation

## 2022-04-24 DIAGNOSIS — R911 Solitary pulmonary nodule: Secondary | ICD-10-CM | POA: Insufficient documentation

## 2022-04-24 DIAGNOSIS — C3411 Malignant neoplasm of upper lobe, right bronchus or lung: Secondary | ICD-10-CM | POA: Diagnosis not present

## 2022-04-24 LAB — GLUCOSE, CAPILLARY: Glucose-Capillary: 127 mg/dL — ABNORMAL HIGH (ref 70–99)

## 2022-04-24 MED ORDER — FLUDEOXYGLUCOSE F - 18 (FDG) INJECTION
9.5000 | Freq: Once | INTRAVENOUS | Status: AC | PRN
  Administered 2022-04-24: 10.25 via INTRAVENOUS

## 2022-04-25 ENCOUNTER — Other Ambulatory Visit: Payer: Self-pay | Admitting: Nurse Practitioner

## 2022-04-25 ENCOUNTER — Other Ambulatory Visit: Payer: Self-pay | Admitting: Oncology

## 2022-04-25 DIAGNOSIS — C3491 Malignant neoplasm of unspecified part of right bronchus or lung: Secondary | ICD-10-CM

## 2022-04-25 NOTE — Progress Notes (Signed)
Yes, CT is ordered and follow up scheduled with you! Thanks!

## 2022-04-25 NOTE — Progress Notes (Signed)
Reviewed imaging with Dr. Benay Spice and Dr. Valeta Harms who agree that there is not a significant change from previous imaging. Recommended close CT follow up. Discussed with patient who was agreeable to this plan. We will schedule repeat CT in 3 months. Orders placed today.   Tyasia, please schedule pt for follow up in 3 months, thanks!

## 2022-05-04 ENCOUNTER — Other Ambulatory Visit: Payer: Self-pay | Admitting: Gastroenterology

## 2022-05-07 ENCOUNTER — Ambulatory Visit (INDEPENDENT_AMBULATORY_CARE_PROVIDER_SITE_OTHER): Payer: PPO

## 2022-05-07 ENCOUNTER — Other Ambulatory Visit: Payer: Self-pay | Admitting: Gastroenterology

## 2022-05-07 DIAGNOSIS — I480 Paroxysmal atrial fibrillation: Secondary | ICD-10-CM

## 2022-05-07 DIAGNOSIS — H401133 Primary open-angle glaucoma, bilateral, severe stage: Secondary | ICD-10-CM | POA: Diagnosis not present

## 2022-05-07 LAB — CUP PACEART REMOTE DEVICE CHECK
Date Time Interrogation Session: 20230804231740
Implantable Pulse Generator Implant Date: 20200709

## 2022-05-09 ENCOUNTER — Encounter (HOSPITAL_COMMUNITY): Payer: PPO | Admitting: Internal Medicine

## 2022-05-10 ENCOUNTER — Other Ambulatory Visit (HOSPITAL_COMMUNITY): Payer: Self-pay | Admitting: Cardiology

## 2022-05-10 ENCOUNTER — Encounter (HOSPITAL_COMMUNITY): Payer: Self-pay

## 2022-05-10 ENCOUNTER — Ambulatory Visit (HOSPITAL_COMMUNITY): Admit: 2022-05-10 | Payer: PPO | Admitting: Gastroenterology

## 2022-05-10 ENCOUNTER — Inpatient Hospital Stay: Payer: PPO | Admitting: Nurse Practitioner

## 2022-05-10 DIAGNOSIS — I509 Heart failure, unspecified: Secondary | ICD-10-CM

## 2022-05-10 DIAGNOSIS — R0602 Shortness of breath: Secondary | ICD-10-CM

## 2022-05-10 SURGERY — ESOPHAGOGASTRODUODENOSCOPY (EGD) WITH PROPOFOL
Anesthesia: Monitor Anesthesia Care

## 2022-05-11 DIAGNOSIS — G4733 Obstructive sleep apnea (adult) (pediatric): Secondary | ICD-10-CM | POA: Diagnosis not present

## 2022-05-15 ENCOUNTER — Encounter (HOSPITAL_COMMUNITY): Payer: Self-pay | Admitting: Cardiology

## 2022-05-15 ENCOUNTER — Ambulatory Visit (HOSPITAL_COMMUNITY)
Admission: RE | Admit: 2022-05-15 | Discharge: 2022-05-15 | Disposition: A | Discharge status: Home / Self Care | Payer: PPO | Source: Ambulatory Visit | Attending: Internal Medicine | Admitting: Internal Medicine

## 2022-05-15 VITALS — BP 140/70 | HR 55 | Wt 199.8 lb

## 2022-05-15 DIAGNOSIS — I5032 Chronic diastolic (congestive) heart failure: Secondary | ICD-10-CM | POA: Insufficient documentation

## 2022-05-15 LAB — CBC
HCT: 38.6 % — ABNORMAL LOW (ref 39.0–52.0)
Hemoglobin: 12.3 g/dL — ABNORMAL LOW (ref 13.0–17.0)
MCH: 29.6 pg (ref 26.0–34.0)
MCHC: 31.9 g/dL (ref 30.0–36.0)
MCV: 92.8 fL (ref 80.0–100.0)
Platelets: 223 10*3/uL (ref 150–400)
RBC: 4.16 MIL/uL — ABNORMAL LOW (ref 4.22–5.81)
RDW: 15.7 % — ABNORMAL HIGH (ref 11.5–15.5)
WBC: 4.7 10*3/uL (ref 4.0–10.5)
nRBC: 0 % (ref 0.0–0.2)

## 2022-05-15 LAB — BASIC METABOLIC PANEL
Anion gap: 11 (ref 5–15)
BUN: 21 mg/dL (ref 8–23)
CO2: 21 mmol/L — ABNORMAL LOW (ref 22–32)
Calcium: 8.8 mg/dL — ABNORMAL LOW (ref 8.9–10.3)
Chloride: 106 mmol/L (ref 98–111)
Creatinine, Ser: 1.76 mg/dL — ABNORMAL HIGH (ref 0.61–1.24)
GFR, Estimated: 38 mL/min — ABNORMAL LOW (ref >60)
Glucose, Bld: 111 mg/dL — ABNORMAL HIGH (ref 70–99)
Potassium: 4.3 mmol/L (ref 3.5–5.1)
Sodium: 138 mmol/L (ref 135–145)

## 2022-05-15 LAB — BRAIN NATRIURETIC PEPTIDE: B Natriuretic Peptide: 222.4 pg/mL — ABNORMAL HIGH (ref 0.0–100.0)

## 2022-05-15 MED ORDER — FUROSEMIDE 20 MG PO TABS: ORAL_TABLET | ORAL | 3 refills | Status: DC

## 2022-05-15 NOTE — Patient Instructions (Signed)
Increase Lasix to 80mg  Twice daily, for 2 days, then change to 80mg  in the morning and 60mg  in the evening  Labs done today, your results will be available in MyChart, we will contact you for abnormal readings.  Get a pulse oximeter from [facility].  Your physician recommends that you schedule a follow-up appointment in: 6 weeks  If you have any questions or concerns before your next appointment please send [name] a message through [place] or call our office at [phone].    TO LEAVE A MESSAGE FOR THE NURSE SELECT OPTION 2, PLEASE LEAVE A MESSAGE INCLUDING: YOUR NAME DATE OF BIRTH CALL BACK NUMBER REASON FOR CALL**this is important as we prioritize the call backs  YOU WILL RECEIVE A CALL BACK THE SAME DAY AS LONG AS YOU CALL BEFORE 4:00 PM  At the [name], you and your health needs are our priority. As part of our continuing mission to provide you with exceptional heart care, we have created designated Provider Care Teams. These Care Teams include your primary Cardiologist (physician) and Advanced Practice Providers (APPs- Physician Assistants and Nurse Practitioners) who all work together to provide you with the care you need, when you need it.   You may see any of the following providers on your designated Care Team at your next follow up: Dr Glori Bickers Dr Haynes Kerns, NP Lyda Jester, [place] [facility] [place], [place] Audry Riles, PharmD   Please be sure to bring in all your medications bottles to every appointment.

## 2022-05-16 ENCOUNTER — Inpatient Hospital Stay: Payer: PPO | Attending: Oncology | Admitting: Oncology

## 2022-05-16 ENCOUNTER — Encounter: Payer: Self-pay | Admitting: Oncology

## 2022-05-16 ENCOUNTER — Inpatient Hospital Stay: Payer: PPO | Admitting: Nurse Practitioner

## 2022-05-16 VITALS — BP 136/60 | HR 60 | Temp 98.1°F | Resp 20 | Ht 73.0 in | Wt 196.0 lb

## 2022-05-16 DIAGNOSIS — D509 Iron deficiency anemia, unspecified: Secondary | ICD-10-CM | POA: Insufficient documentation

## 2022-05-16 DIAGNOSIS — J449 Chronic obstructive pulmonary disease, unspecified: Secondary | ICD-10-CM | POA: Insufficient documentation

## 2022-05-16 DIAGNOSIS — Z85038 Personal history of other malignant neoplasm of large intestine: Secondary | ICD-10-CM | POA: Diagnosis not present

## 2022-05-16 DIAGNOSIS — C187 Malignant neoplasm of sigmoid colon: Secondary | ICD-10-CM

## 2022-05-16 DIAGNOSIS — Z7901 Long term (current) use of anticoagulants: Secondary | ICD-10-CM | POA: Insufficient documentation

## 2022-05-16 DIAGNOSIS — Z79899 Other long term (current) drug therapy: Secondary | ICD-10-CM | POA: Insufficient documentation

## 2022-05-16 DIAGNOSIS — Z85118 Personal history of other malignant neoplasm of bronchus and lung: Secondary | ICD-10-CM | POA: Insufficient documentation

## 2022-05-16 DIAGNOSIS — I4891 Unspecified atrial fibrillation: Secondary | ICD-10-CM | POA: Insufficient documentation

## 2022-05-16 NOTE — Progress Notes (Signed)
[place] OFFICE PROGRESS NOTE   Diagnosis: Non-small cell lung cancer, colon cancer  INTERVAL HISTORY:   Troy [name] returns for a scheduled visit.  He continues to have exertional dyspnea.  He reports the furosemide dose was increased by cardiology yesterday.  He feels better today.  He relates this to the cooler weather. He underwent a PET scan 04/24/2022.  The bandlike opacity in the inferior right upper lobe has mild metabolic activity.  Focal nodularity anteriorly has low-level metabolic activity, SUV 2.3.  No evidence of hypermetabolic activity in the liver, adrenal glands, or lymph nodes. Dr. Valeta Harms viewed the images and recommends CT surveillance. Objective:  Vital signs in last 24 hours:  Blood pressure 136/60, pulse 60, temperature 98.1 F (36.7 C), temperature source Oral, resp. rate 20, height $RemoveBe'6\' 1"'bYapEjcXe$  (1.854 m), weight 196 lb (88.9 kg), SpO2 95 %.    HEENT: Neck without mass Lymphatics: No cervical, supraclavicular, axillary, or inguinal nodes Resp: Decreased breath sounds at the right lower chest, no respiratory distress Cardio: Irregular GI: No hepatosplenomegaly Vascular: No leg edema, left lower leg is slightly larger than the right side   Lab Results:  Lab Results  Component Value Date   WBC 4.7 05/15/2022   HGB 12.3 (L) 05/15/2022   HCT 38.6 (L) 05/15/2022   MCV 92.8 05/15/2022   PLT 223 05/15/2022   NEUTROABS 3.2 04/04/2022    CMP  Lab Results  Component Value Date   NA 138 05/15/2022   K 4.3 05/15/2022   CL 106 05/15/2022   CO2 21 (L) 05/15/2022   GLUCOSE 111 (H) 05/15/2022   BUN 21 05/15/2022   CREATININE 1.76 (H) 05/15/2022   CALCIUM 8.8 (L) 05/15/2022   PROT 6.7 04/04/2022   ALBUMIN 4.2 04/04/2022   AST 15 04/04/2022   ALT 13 04/04/2022   ALKPHOS 76 04/04/2022   BILITOT 0.7 04/04/2022   GFRNONAA 38 (L) 05/15/2022   GFRAA 74 05/16/2020    Lab Results  Component Value Date   CEA1 1.19 06/19/2021   CEA 2.35 04/19/2022     Medications: I have reviewed the patient's current medications.   Assessment/Plan: 1.Adenocarcinoma of sigmoid colon-stage II (T3N0)  Colonoscopy 06/25/2018- multiple polyps in the ascending and descending colon, partially obstructing mass in the sigmoid colon-biopsy confirmed adenocarcinoma CTs 06/25/2018-enlarging right paratracheal lymph node compared to November 2018, no other evidence of metastatic disease Low anterior resection 12/12/2018, stage II (T3N0) adenocarcinoma the sigmoid colon, no loss of mismatch repair protein expression, MSI-stable Colonoscopy 06/23/2020-polyps removed from the transverse colon, ascending colon, and cecum-tubular adenomas  CT chest 01/14/2020-stable mildly enlarged high right paratracheal node and right hilar node CT abdomen/pelvis 01/29/2020-no evidence of recurrent disease, stable right lower lobe nodule CT chest 01/24/2021-mild growth of 2 irregular basilar right upper lobe nodules, stable high right mediastinal node PET scan 02/08/2021- band of nodules in the right upper lobe including a 13 mm nodule with an SUV of 3.2 and a 13 mm nodule with an SUV of 1.4.,  12 mm nodule in the upper right mediastinum with an SUV of 3.5      2.  History of iron deficiency anemia Secondary to bleeding from tumor, exacerbated by anticoagulation with eliquis  3. Stage Ib squamous cell carcinoma of right lower lung s/p bronchoscopy, mediastinoscopy, right thoracoscopic lower lobectomy 01/24/2011 -Path poorly differentiated squamous cell carcinoma with positive vascular invasion LN stations 4R, 4L, 7, 8, 9, 10, 11, and 12 w/o evidence of malignancy  - pT2a,pN0,pMX stage  IB  -managed and monitored per Dr. Elenor Quinones at [facility]. Last Ct chest 05/01/18 stable, without concerning evidence of recurrence  -CT 06/25/2018- nonspecific small bilateral nodules, enlarging superior right paratracheal node -EUS biopsy of the right paratracheal nodes (several rounded nodes measuring 4 cm in total) on  07/10/2018- negative for malignancy -CT chest 03/03/2019- stable postoperative appearance of the chest, unchanged mildly prominent right paratracheal lymph node, no evidence of metastatic disease -CT chest 01/14/2020-stable surgical changes in the right chest, stable mild mediastinal and right hilar adenopathy -CT chest 01/24/2021- mild interval growth of irregular nodular opacities in the right upper lobe along the minor fissure -PET 0/03/1218-XJOI of hypermetabolic nodules in the inferior aspect of the right upper lobe-indeterminate, nodularity increased over time.  Hypermetabolic nodule in the right upper mediastinum, similar small right effusion -CT super D chest 03/21/2021-consolidation and nodularity in the posterior inferior right upper lobe slightly diminished, unchanged loculated small right effusion, fine clustered centrilobular nodularity concentrated in the left upper lobe-unchanged.  No enlarged mediastinal, hilar, or axillary nodes -Right pleural fluid 07/05/2021-reactive mesothelial cells -CT super D chest 09/12/2021-decrease in size of right paratracheal and left mediastinal nodes, stable small right pleural effusion, right lower lobectomy, similar soft tissue thickening in the inferior right upper lobe, upper lobe predominant centrilobular nodularity favored to represent bronchiolitis -CT super D chest 04/09/2022-increased nodular thickening at the minor fissure, increased masslike density in the inferior right upper lobe, stable right pleural effusion -PET 04/24/2022-enlarging bandlike opacity in the inferior right upper lobe low-level hypermetabolic activity-slightly increased from the previous PET, no hypermetabolic activity in the anterior nodularity, no evidence of metastatic disease, stable chronic loculated right pleural effusion    4. COPD 5. Atrial fibrillation 6. Thyrotoxicosis-potentially amiodarone related 7.  Multiple gastric polyps on endoscopy 06/23/2020-1 oozing  polyp-hyperplastic polyp, biopsy from the gastric antrum revealed intestinal metaplasia   Disposition: Troy [name] is in clinical remission from colon cancer and lung cancer.  The slight increase in nodular thickening at the minor fissure and inferior right upper lobe are likely benign findings.  The PET revealed a slight increase in metabolic activity at the inferior right upper lobe compared to a PET from May 2022.  The plan is to continue CT surveillance.  He is scheduled for a chest CT and follow-up with Dr. Valeta Harms at a 46-month interval.  He will return for an office visit and CEA in approximately 5 months.  Betsy Coder, MD  05/16/2022  12:37 PM

## 2022-05-17 NOTE — Progress Notes (Signed)
Patient ID: Troy Reddy [name], male   DOB: March 23, 1941, 81 y.o.   MRN: [id number] PCP: Dr. Quay Burow Cardiology: [name]  81 y.o. with history of HTN, COPD, active smoking/COPD, carotid stenosis s/p right CEA, paroxysmal atrial fibrillation, and PAD.   He does not have known obstructive CAD but is at high risk for CAD based on his comorbidities.  Lexiscan Cardiolite in 3/14 showed no ischemia or infarction and echo in 3/14 showed normal EF.  He had a left fem-pop bypass at the [facility] in the '90s.  He is followed at [facility] for PAD. He has chronic right calf/thigh/buttocks claudication that is unchanged over the last few years and follows regularly at [facility].  He has had right lower lobectomy for lung cancer.  He continues to stay off cigarettes.    At a prior appointment, he was in atrial fibrillation but did not realize it. Dr Aundra Dubin  started him on Eliquis and diltiazem CD, and he spontaneously converted to NSR.  In 5/15, he was at [facility] and felt "strange" one day: fatigued, weak, short of breath.  He went to the ER and was in atrial fibrillation with HR in 80s-90s.  He spontaneously converted to NSR in the ER.  He felt back to normal after converting to NSR.  Started on Multaq 400 mg bid.  With CHF, he was eventually transitioned over to amiodarone.   He had patch angioplasty revision of left fem-pop bypass in 11/16.  Now with minimal claudication.  He follows with [facility].     He had a Cardiolite and echo in 7/17 that were unremarkable.  Repeat Cardiolite in 12/17 showed no ischemia, echo was uninterpretable.  Cardiac MRI was therefore done in 1/18, showing EF 66% with normal-appearing RV, no late gadolinium enhancement.   Given increased exertional dyspnea and a defect on Cardiolite, he had LHC in 11/18.  This showed no obstructive CAD. He had a chest CT done given concern for possible amiodarone lung toxicity with increased dyspnea. This showed emphysema, no ILD.    In 5/19, he developed a  probable viral syndrome with dehydration and had a presyncopal episode.  He was orthostatic in the hospital.  He was noted to be in atrial fibrillation this admission which remained persistent.  He had a TEE-guided DCCV back to NSR.   In 9/19, he was admitted with GI bleeding and found to have a sigmoid mass on colonoscopy, path showed sigmoid adenocarcinoma.  He was noted to have right paratracheal lymphadenopathy but EUS with biopsy in 10/19 did not show malignancy.  He had surgical resection of colon cancer.   Amiodarone was stopped with elevated ESR and increased dyspnea.  He was also found to have hyperthyroidism. He was treated with methimazole by endocrinology.    He had atrial fibrillation ablation in 7/20.   Echo in 4/21 showed EF 60-65%, normal RV.    In 5/21, due to high atrial fibrillation burden, he had redo atrial fibrillation ablation.   Cardiolite in 6/21 showed EF 70%, no ischemia/infarction.   He was admitted in 10/22 with COVID-19 and COPD exacerbation.  He had a thoracentesis, cytology was negative for cancerous cells.  Echo in 10/22 showed EF 55-60%, moderate LVH, mildly decreased RV systolic function.   He was found to have choledocholithiasis with elevated LFTs, had ERCP with sphincterotomy in 5/23, refused cholecystectomy.  He was also admitted in 5/23 with upper GI bleeding.  He was found to have a duodenal ulcer on EGD.  Today he returns for HF follow up. He walks slowly/shuffles, primarily due to poor eyesight.  He has a trainer who comes 3 days/week and he does laps in his driveway.  He is short of breath walking more than about 100 feet, especially in the heat.  Weight is up about 6 lbs. He has significant PAD, especially in the right leg, but denies claudication.  No palpitations.  Oxygen saturation was 89% when he walked in today.   ECG (personally reviewed): Slow atrial fibrillation with rate 50s, RBBB  Labs (3/13): LDL 75, HDL 35, K 4.1, creatinine 1.1 Labs  (3/14): LDL 91, HDL 27 Labs (10/14): K 4.3, creatinine 0.98 Labs (4/15): K 4.9, creatinine 1.1, LDL 76, HDL 30 Labs (5/15): K 4.3, creatinine 1.7, BNP 251 Labs (6/15): K 4.4, creatinine 1.3 Labs (10/15): TSH normal Labs (5/16): K 4.2, creatinine 1.12, HCT 39.5, LFTs normal, LDL 84, HDL 43, TSH normal Labs (11/16): creatinine 0.94 Labs (4/17): LDL 39, HDL 39 Labs (6/17): K 4.5, creatinine 1.2 Labs (9/17): K 4, creatinine 1.25, HCT 38.7 Labs (12/17): K 4.5, creatinine 1.49 => 1.62, BNP 43 Labs (2/18): K 3.8, creatinine 1.33, LFTs normal, TSH normal Labs (6/18): K 4.3, creatinine 1.49, LFTs normal, TSH normal, hgb 12.3 Labs (11/18): ESR 60, TSH normal, LFTs normal, creatinine 1.34 Labs (1/19): LDL 50 Labs (5/19): K 4.6, creatinine 1.55, LFTs normal, hgb 12.7, LFTs normal Labs (10/19): K 4 => 4.1, creatinine 1.39 => 1.43, BNP 279, hgb 10.7, TSH low, free T4 and free T3 low, ESR 108 Labs (3/21): K 4.5, creatinine 1.19, TSH normal Labs (4/21): K 4.8, creatinine 1.28, BNP 524, LDL 59 Labs (7/21): hgb 11.5, creatinine 1.4 Labs (8/21): K 4.3, creatinine 1.09 Labs (9/21): hgb 12.7 Labs (2/22): K 5, creatinine 1.54 Labs (5/22): K 4.2, creatinine 1.38, hgb 14.3, LDL 31 Labs (10/22): LDL 41, TGs 351, hgb 13.4, K 4.3, creatinine 1.57 Labs (1122): K 3.6, creatinine 1.32 Labs (1/23): K 4.0, creatinine 1.31, LDL 34 Labs (4/23): K 4.2, creatinine 1.66, LFTs normal, hgb 13.3, TSH normal Labs (5/23): K 3.2, creatinine 1.49 Labs (7/23): hgb 12  PMH: 1. HTN: Fatigue and cough with ramipril use.  2. COPD: Quit smoking 2014. PFTs (1/18) with moderately severe COPD.  - CT chest (11/18): Emphysema noted, no ILD.  3. AAA: CT 1/13 with 3.0 cm AAA.  Abdominal US (1/14) with 3.4 cm AAA. Abdominal US (4/15) with 3.25 x 3.27 AAA. Abdominal US (3/16) with 3.7 cm AAA.  Followed at [facility].  - Abd [name] (1/19): 3.3 cm AAA. 4. Squamous cell lung cancer diagnosed 2/12.  Had right lower lobectomy in 4/12.  5.  Hyperlipidemia 6. PAD: Left fem-pop bypass 1994.  ABIs (2/12) 0.62 on right, 0.95 on left. ABIs (1/14): 0.65 on right, 1.04 on left. ABIs (4/15) 0.66 right, 0.87 left.  ABIs (3/16) 0.6 right, 0.88 left.  Patch angioplasty left fem-pop bypass in 11/16.   - Left fem-pop bypass patent on doppler evaluation in 12/17.  - ABIs (2/21): normal on left (1.02), moderately decreased on right (0.54).  - Peripheral arterial dopplers (3/23): >50% right iliac stenosis, ABIs 0.58 on right and 1.11 on left.  7. CAD: Stress myoview 2004 was normal. Lexiscan Cardiolite (3/14) with EF 66%, no ischemia or infarction. Lexiscan Cardiolite (7/17) with EF 61%, no ischemia/infarction.  - Cardiolite (12/17) with EF 62%, inferior/inferolateral fixed defect, most likely diaphragmatic attenuation, no ischemia.  - Cardiolite (9/18) with EF > 65%, fixed inferior defect (attenuation versus infarction), no  ischemia.  - LHC (11/18): No obstructive CAD.  - Cardiolite (6/21): EF 70%, no ischemia/infarction 8. Chronic diastolic CHF: Echo (4/43): technically difficult with EF 55-60%, upper normal RV size.  Echo (5/16) with EF 60-65%.  Echo (7/17) with EF 55-60%, normal RV size and systolic function.  - Echo 12/17 with very poor windows, unable to comment on LV or RV function.  - Cardiac MRI (1/18) with EF 66%, normal RV size and systolic function.  - TEE (5/19): EF 55-60%, normal RV size and systolic function.  - Echo (4/21): EF 60-65%, normal RV - Echo (10/22): EF 55-60%, moderate LVH, mildly decreased RV systolic function 9. Carotid stenosis: TIA 10/14.  Carotid dopplers with > 15% RICA, 40-08% LICA.  Patient had right CEA in 10/14. Carotids (4/15) patent right CEA, LICA 67-61% stenosis.  Carotids (95/09) < 32% LICA, patent right CEA.  - Carotid dopplers (12/17): right CEA ok, < 67% LICA stenosis.  - Carotid dopplers (1/19): Right CEA ok, 1-24% LICA stenosis.  - Carotid dopplers (2/21): Right CEA ok, LICA 58-09% stenosis.  - Carotid  dopplers (3/23): Right CEA ok, LICA 9-83% 10. Atrial fibrillation: Paroxysmal. First noted after lobectomy in 4/12 (brief), recurrence in 4/15 then in 5/15.  - TEE-guided DCCV for persistent atrial fibrillation in 5/19.  - Atrial fibrillation ablation 7/20.  - Re-do atrial fibrillation ablation 5/21.  11. Spinal stenosis 12. OSA: Using CPAP.  13. Glaucoma 14. Has ILR 15. Colon cancer: Sigmoid adenocarcinoma, s/p surgical resection.  16. 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  17. COVID-19 10/22 18. Choledocholithiasis 19. Upper GI bleeding in 5/23 from duodenal ulcer.   SH: Married, lives in [place].  Former [place].  Quit smoking in 2014.  Rare ETOH now.   FH: No premature CAD  ROS: All systems reviewed and negative except as per HPI.   Current Outpatient Medications  Medication Sig Dispense Refill   acetaminophen (TYLENOL) 325 MG tablet Take 650 mg by mouth every 6 (six) hours as needed for moderate pain or headache.     albuterol (VENTOLIN HFA) 108 (90 Base) MCG/ACT inhaler Inhale 2 puffs into the lungs every 6 (six) hours as needed for wheezing or shortness of breath. 8.5 g 0   amLODipine (NORVASC) 5 MG tablet TAKE ONE TABLET BY MOUTH EVERY DAY 60 tablet 2   apixaban (ELIQUIS) 5 MG TABS tablet Take 1 tablet (5 mg total) by mouth 2 (two) times daily. 60 tablet 6   Brinzolamide-Brimonidine (SIMBRINZA) 1-0.2 % SUSP Apply 1 drop to eye in the morning, at noon, and at bedtime.     Budeson-Glycopyrrol-Formoterol (BREZTRI AEROSPHERE) 160-9-4.8 MCG/ACT AERO Inhale 2 puffs into the lungs in the morning and at bedtime. 10.7 g 0   docusate sodium (COLACE) 100 MG capsule Take 100 mg by mouth 2 (two) times daily.      escitalopram (LEXAPRO) 10 MG tablet TAKE ONE TABLET BY MOUTH EVERY DAY 28 tablet 5   JARDIANCE 10 MG TABS tablet TAKE ONE TABLET BY MOUTH EVERY DAY BEFORE BREAKFAST. 30 tablet 11   latanoprost (XALATAN) 0.005 % ophthalmic solution Place 1 drop  into both eyes at bedtime.     losartan (COZAAR) 25 MG tablet TAKE ONE TABLET BY MOUTH EVERY DAY 90 tablet 3   Melatonin 10 MG TABS Take 20 mg by mouth at bedtime.     metoprolol succinate (TOPROL-XL) 25 MG 24 hr tablet TAKE 1/2 TABLET EVERY EVENING WITH OR IMMEDIATELY FOLLOWING A MEAL 45 tablet 3  montelukast (SINGULAIR) 10 MG tablet TAKE ONE TABLET BY MOUTH AT BEDTIME 28 tablet 5   Multiple Vitamins-Minerals (CENTRUM SILVER PO) Take 1 tablet by mouth daily.     omeprazole (PRILOSEC) 20 MG capsule Take 1 capsule (20 mg total) by mouth 2 (two) times daily. 60 capsule 3   ondansetron (ZOFRAN) 4 MG tablet Take 1 tablet (4 mg total) by mouth every 6 (six) hours as needed for nausea. 20 tablet 0   OVER THE COUNTER MEDICATION Take 3 capsules by mouth daily. Balance of Nature Fruits     OVER THE COUNTER MEDICATION Take 3 capsules by mouth daily. Balance of Nature Vegetables     pantoprazole (PROTONIX) 40 MG tablet Take 1 tablet (40 mg total) by mouth 2 (two) times daily. 60 tablet 0   potassium chloride SA (KLOR-CON M) 20 MEQ tablet TAKE TWO TABLETS BY MOUTH IN THE MORNING AND TAKE ONE TABLET IN THE EVENING 90 tablet 6   rosuvastatin (CRESTOR) 40 MG tablet TAKE ONE TABLET BY MOUTH EVERY DAY 90 tablet 3   senna (SENOKOT) 8.6 MG tablet Take 1 tablet by mouth 2 (two) times daily.     vitamin B-12 (CYANOCOBALAMIN) 1000 MCG tablet Take 1 tablet (1,000 mcg total) by mouth daily. 30 tablet 0   furosemide (LASIX) 20 MG tablet Take 4 tablets (80 mg total) by mouth every morning AND 3 tablets (60 mg total) every evening. 450 tablet 3   No current facility-administered medications for this encounter.   Wt Readings from Last 3 Encounters:  05/16/22 88.9 kg (196 lb)  05/15/22 90.6 kg (199 lb 12.8 oz)  04/19/22 88.7 kg (195 lb 9.6 oz)   BP (!) 140/70   Pulse (!) 55   Wt 90.6 kg (199 lb 12.8 oz)   SpO2 (!) 89%   BMI 26.36 kg/m    General: NAD Neck: JVP 8-9 cm, no thyromegaly or thyroid nodule.  Lungs:  Clear to auscultation bilaterally with normal respiratory effort. CV: Nondisplaced PMI.  Heart regular S1/S2, no S3/S4, no murmur.  1+ ankle edema.  No carotid bruit.  Unable to palpate pedal pulses.  Abdomen: Soft, nontender, no hepatosplenomegaly, no distention.  Skin: Intact without lesions or rashes.  Neurologic: Alert and oriented x 3.  Psych: Normal affect. Extremities: No clubbing or cyanosis.  HEENT: Normal.   Assessment/Plan: 1. Chronic diastolic CHF: Echo in 94/49 with EF 55-60%, mild LVH, mildly dysfunctional RV.  Chronic NYHA class III symptoms, mild volume overload on exam today with weight up about 6 lbs.   - Increase Lasix to 80 mg bid x 2 days then 80 qam/60 qpm.  BMET today and again in 10 days.   - Continue KCl 40 qam/20 qpm.  - Continue Jardiance 10 mg daily.  - Continue losartan 25 mg daily. - Continue Toprol XL 12.5 mg daily. 2. Hyperlipidemia: 1/23 lipids with good LDL. - Continue Crestor 40 mg daily.  - Continue Vascepa 2 g bid. 3. Carotid stenosis: s/p R CEA.  Dopplers followed at [facility].  4. PAD: s/p patch angioplasty to left fem-pop bypass in 11/16.  Followed at [facility].  Peripheral arterial dopplers in 3/23 with >50% right iliac stenosis, but no rest pain or pedal ulcerations.  Rare mild claudication.  - Follows with [facility].  5. AAA: Stable on last [name], followed at [facility].   6. COPD: No longer smoking. PFTs in 1/18 showed moderately severe obstructive defect. CT chest in 11/18 showed emphysema.  COPD contributes to his dyspnea.  Noted to have oxygen saturation 89% when walking in today.  - I recommended that he get a pulse oximeter and check oxygen saturation at home.  He may benefit from home oxygen, at least with exertion.  7. Atrial fibrillation: S/p atrial fibrillation ablation in 7/20 and redo in 5/21.  He has been primarily in NSR since redo ablation.  He is actually in slow AF today, he has not noticed this.  - Continue Toprol XL 12.5 mg daily.  - Continue Eliquis 5 mg  bid.  - Continue to limit ETOH.  - Continue to use CPAP.  - Will reassess at followup, if remains in AF will consider cardioversion.  8. CKD: Stage 3.  BMET today.  9. CAD: LHC in 11/18 with nonobstructive disease only.  Cardiolite in 6/21 with no ischemia/infarction. No chest pain.  10. 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  He is no longer on methimazole, follows with endocrinology.     11. Colon cancer: Sigmoid adenocarcinoma, s/p surgical resection. In remission.  12. HTN: Stable.   13. Pleural effusion: Chronic right pleural effusion, s/p right lower lobectomy.   14. Squamous cell lung cancer: S/p right lower lobectomy.  Follows with Dr. Valeta Harms.  15. Upper GI bleeding: In 5/23 due to duodenal ulcer.  - Continue PPI.  - Check CBC today.   Followup in 6 wks with APP.   [name] 05/17/2022

## 2022-05-28 ENCOUNTER — Telehealth: Payer: PPO

## 2022-05-29 ENCOUNTER — Other Ambulatory Visit (HOSPITAL_COMMUNITY): Payer: Self-pay | Admitting: Cardiology

## 2022-06-06 ENCOUNTER — Ambulatory Visit (INDEPENDENT_AMBULATORY_CARE_PROVIDER_SITE_OTHER): Payer: PPO

## 2022-06-06 DIAGNOSIS — G459 Transient cerebral ischemic attack, unspecified: Secondary | ICD-10-CM

## 2022-06-07 LAB — CUP PACEART REMOTE DEVICE CHECK
Date Time Interrogation Session: 20230906232301
Implantable Pulse Generator Implant Date: 20200709

## 2022-06-13 NOTE — Progress Notes (Signed)
Carelink Summary Report / Loop Recorder 

## 2022-06-25 NOTE — Progress Notes (Signed)
Carelink Summary Report / Loop Recorder 

## 2022-06-26 ENCOUNTER — Encounter (HOSPITAL_COMMUNITY): Payer: Self-pay

## 2022-06-26 ENCOUNTER — Ambulatory Visit (HOSPITAL_COMMUNITY)
Admission: RE | Admit: 2022-06-26 | Discharge: 2022-06-26 | Disposition: A | Discharge status: Home / Self Care | Payer: PPO | Source: Ambulatory Visit | Attending: Family Medicine | Admitting: Family Medicine

## 2022-06-26 VITALS — BP 120/50 | HR 53 | Wt 193.8 lb

## 2022-06-26 DIAGNOSIS — Z7901 Long term (current) use of anticoagulants: Secondary | ICD-10-CM | POA: Insufficient documentation

## 2022-06-26 DIAGNOSIS — I5032 Chronic diastolic (congestive) heart failure: Secondary | ICD-10-CM | POA: Diagnosis not present

## 2022-06-26 DIAGNOSIS — I714 Abdominal aortic aneurysm, without rupture, unspecified: Secondary | ICD-10-CM | POA: Insufficient documentation

## 2022-06-26 DIAGNOSIS — J449 Chronic obstructive pulmonary disease, unspecified: Secondary | ICD-10-CM | POA: Insufficient documentation

## 2022-06-26 DIAGNOSIS — I110 Hypertensive heart disease with heart failure: Secondary | ICD-10-CM | POA: Insufficient documentation

## 2022-06-26 DIAGNOSIS — E0590 Thyrotoxicosis, unspecified without thyrotoxic crisis or storm: Secondary | ICD-10-CM | POA: Insufficient documentation

## 2022-06-26 DIAGNOSIS — J90 Pleural effusion, not elsewhere classified: Secondary | ICD-10-CM | POA: Insufficient documentation

## 2022-06-26 DIAGNOSIS — C7801 Secondary malignant neoplasm of right lung: Secondary | ICD-10-CM | POA: Diagnosis not present

## 2022-06-26 DIAGNOSIS — I10 Essential (primary) hypertension: Secondary | ICD-10-CM | POA: Diagnosis not present

## 2022-06-26 DIAGNOSIS — I6521 Occlusion and stenosis of right carotid artery: Secondary | ICD-10-CM | POA: Diagnosis not present

## 2022-06-26 DIAGNOSIS — J439 Emphysema, unspecified: Secondary | ICD-10-CM

## 2022-06-26 DIAGNOSIS — K922 Gastrointestinal hemorrhage, unspecified: Secondary | ICD-10-CM | POA: Insufficient documentation

## 2022-06-26 DIAGNOSIS — Z8709 Personal history of other diseases of the respiratory system: Secondary | ICD-10-CM

## 2022-06-26 DIAGNOSIS — F17200 Nicotine dependence, unspecified, uncomplicated: Secondary | ICD-10-CM | POA: Diagnosis not present

## 2022-06-26 DIAGNOSIS — I739 Peripheral vascular disease, unspecified: Secondary | ICD-10-CM | POA: Diagnosis not present

## 2022-06-26 DIAGNOSIS — G4733 Obstructive sleep apnea (adult) (pediatric): Secondary | ICD-10-CM

## 2022-06-26 DIAGNOSIS — E782 Mixed hyperlipidemia: Secondary | ICD-10-CM | POA: Diagnosis not present

## 2022-06-26 DIAGNOSIS — I2510 Atherosclerotic heart disease of native coronary artery without angina pectoris: Secondary | ICD-10-CM | POA: Diagnosis not present

## 2022-06-26 DIAGNOSIS — Z85038 Personal history of other malignant neoplasm of large intestine: Secondary | ICD-10-CM | POA: Insufficient documentation

## 2022-06-26 DIAGNOSIS — E785 Hyperlipidemia, unspecified: Secondary | ICD-10-CM | POA: Diagnosis not present

## 2022-06-26 DIAGNOSIS — I4819 Other persistent atrial fibrillation: Secondary | ICD-10-CM | POA: Diagnosis not present

## 2022-06-26 DIAGNOSIS — N183 Chronic kidney disease, stage 3 unspecified: Secondary | ICD-10-CM | POA: Insufficient documentation

## 2022-06-26 DIAGNOSIS — I6523 Occlusion and stenosis of bilateral carotid arteries: Secondary | ICD-10-CM

## 2022-06-26 DIAGNOSIS — I480 Paroxysmal atrial fibrillation: Secondary | ICD-10-CM

## 2022-06-26 DIAGNOSIS — C3490 Malignant neoplasm of unspecified part of unspecified bronchus or lung: Secondary | ICD-10-CM

## 2022-06-26 LAB — CBC
HCT: 39 % (ref 39.0–52.0)
Hemoglobin: 12.5 g/dL — ABNORMAL LOW (ref 13.0–17.0)
MCH: 29.2 pg (ref 26.0–34.0)
MCHC: 32.1 g/dL (ref 30.0–36.0)
MCV: 91.1 fL (ref 80.0–100.0)
Platelets: 249 10*3/uL (ref 150–400)
RBC: 4.28 MIL/uL (ref 4.22–5.81)
RDW: 16.1 % — ABNORMAL HIGH (ref 11.5–15.5)
WBC: 4.6 10*3/uL (ref 4.0–10.5)
nRBC: 0 % (ref 0.0–0.2)

## 2022-06-26 LAB — BASIC METABOLIC PANEL
Anion gap: 8 (ref 5–15)
BUN: 25 mg/dL — ABNORMAL HIGH (ref 8–23)
CO2: 25 mmol/L (ref 22–32)
Calcium: 9.3 mg/dL (ref 8.9–10.3)
Chloride: 105 mmol/L (ref 98–111)
Creatinine, Ser: 2.16 mg/dL — ABNORMAL HIGH (ref 0.61–1.24)
GFR, Estimated: 30 mL/min — ABNORMAL LOW (ref >60)
Glucose, Bld: 168 mg/dL — ABNORMAL HIGH (ref 70–99)
Potassium: 4.3 mmol/L (ref 3.5–5.1)
Sodium: 138 mmol/L (ref 135–145)

## 2022-06-26 NOTE — Patient Instructions (Signed)
It was great to see you today! No medication changes are needed at this time.  Labs today We will only contact you if something comes back abnormal or we need to make some changes. Otherwise no news is good news!  Please be sure to follow up with Dr Radford Pax regarding your CPAP [phone]   Your physician recommends that you schedule a follow-up appointment in: 4-6 weeks  in the [facility]     You are scheduled for a TEE/Cardioversion/TEE Cardioversion on 07/10/2022 with [name].  Please arrive at the [facility] (Main Entrance A) at [facility]: [address] [place], [place] [address] at 9 am.   DIET: Nothing to eat or drink after midnight except a sip of water with medications (see medication instructions below)  Medication Instructions: Hold Lasix  Continue your anticoagulant: Eliquis  You will need to continue your anticoagulant after your procedure until you  are told by your  Provider that it is safe to stop   Labs: Pre procedure labs done 06/26/2022  You must have a responsible person to drive you home and stay in the waiting area during your procedure. Failure to do so could result in cancellation.  Bring your insurance cards.  *Special Note: Every effort is made to have your procedure done on time. Occasionally there are emergencies that occur at the hospital that may cause delays. Please be patient if a delay does occur.    Do the following things EVERYDAY: Weigh yourself in the morning before breakfast. Write it down and keep it in a log. Take your medicines as prescribed Eat low salt foods--Limit salt (sodium) to 2000 mg per day.  Stay as active as you can everyday Limit all fluids for the day to less than 2 liters

## 2022-06-26 NOTE — H&P (View-Only) (Signed)
Patient ID: Troy Reddy [name], male   DOB: March 23, 1941, 81 y.o.   MRN: [id number] PCP: Dr. Quay Burow Cardiology: [name]  81 y.o. with history of HTN, COPD, active smoking/COPD, carotid stenosis s/p right CEA, paroxysmal atrial fibrillation, and PAD.   He does not have known obstructive CAD but is at high risk for CAD based on his comorbidities.  Lexiscan Cardiolite in 3/14 showed no ischemia or infarction and echo in 3/14 showed normal EF.  He had a left fem-pop bypass at the [facility] in the '90s.  He is followed at [facility] for PAD. He has chronic right calf/thigh/buttocks claudication that is unchanged over the last few years and follows regularly at [facility].  He has had right lower lobectomy for lung cancer.  He continues to stay off cigarettes.    At a prior appointment, he was in atrial fibrillation but did not realize it. Dr Aundra Dubin  started him on Eliquis and diltiazem CD, and he spontaneously converted to NSR.  In 5/15, he was at [facility] and felt "strange" one day: fatigued, weak, short of breath.  He went to the ER and was in atrial fibrillation with HR in 80s-90s.  He spontaneously converted to NSR in the ER.  He felt back to normal after converting to NSR.  Started on Multaq 400 mg bid.  With CHF, he was eventually transitioned over to amiodarone.   He had patch angioplasty revision of left fem-pop bypass in 11/16.  Now with minimal claudication.  He follows with [facility].     He had a Cardiolite and echo in 7/17 that were unremarkable.  Repeat Cardiolite in 12/17 showed no ischemia, echo was uninterpretable.  Cardiac MRI was therefore done in 1/18, showing EF 66% with normal-appearing RV, no late gadolinium enhancement.   Given increased exertional dyspnea and a defect on Cardiolite, he had LHC in 11/18.  This showed no obstructive CAD. He had a chest CT done given concern for possible amiodarone lung toxicity with increased dyspnea. This showed emphysema, no ILD.    In 5/19, he developed a  probable viral syndrome with dehydration and had a presyncopal episode.  He was orthostatic in the hospital.  He was noted to be in atrial fibrillation this admission which remained persistent.  He had a TEE-guided DCCV back to NSR.   In 9/19, he was admitted with GI bleeding and found to have a sigmoid mass on colonoscopy, path showed sigmoid adenocarcinoma.  He was noted to have right paratracheal lymphadenopathy but EUS with biopsy in 10/19 did not show malignancy.  He had surgical resection of colon cancer.   Amiodarone was stopped with elevated ESR and increased dyspnea.  He was also found to have hyperthyroidism. He was treated with methimazole by endocrinology.    He had atrial fibrillation ablation in 7/20.   Echo in 4/21 showed EF 60-65%, normal RV.    In 5/21, due to high atrial fibrillation burden, he had redo atrial fibrillation ablation.   Cardiolite in 6/21 showed EF 70%, no ischemia/infarction.   He was admitted in 10/22 with COVID-19 and COPD exacerbation.  He had a thoracentesis, cytology was negative for cancerous cells.  Echo in 10/22 showed EF 55-60%, moderate LVH, mildly decreased RV systolic function.   He was found to have choledocholithiasis with elevated LFTs, had ERCP with sphincterotomy in 5/23, refused cholecystectomy.  He was also admitted in 5/23 with upper GI bleeding.  He was found to have a duodenal ulcer on EGD.  Follow up 8/23, found to be in atrial fibrillation with slow ventricular rate. Mildly volume overloaded and instructed to increase Lasix to 80 bid x 3 days then Lasix 80/60.  Today he returns for HF follow up. Overall feeling fine. He continues to work out with a Physiological scientist 3x/week. He has dyspnea walking further distances on flat ground. Denies palpitations, abnormal bleeding, CP, dizziness, edema, or PND/Orthopnea. Appetite ok. No fever or chills. Weight at home 188-190 pounds. Taking all medications. No bothersome leg pain. Wakes frequently at  night to void.  ECG (personally reviewed): atrial fibrillation, 56 bpm  Labs (3/13): LDL 75, HDL 35, K 4.1, creatinine 1.1 Labs (3/14): LDL 91, HDL 27 Labs (10/14): K 4.3, creatinine 0.98 Labs (4/15): K 4.9, creatinine 1.1, LDL 76, HDL 30 Labs (5/15): K 4.3, creatinine 1.7, BNP 251 Labs (6/15): K 4.4, creatinine 1.3 Labs (10/15): TSH normal Labs (5/16): K 4.2, creatinine 1.12, HCT 39.5, LFTs normal, LDL 84, HDL 43, TSH normal Labs (11/16): creatinine 0.94 Labs (4/17): LDL 39, HDL 39 Labs (6/17): K 4.5, creatinine 1.2 Labs (9/17): K 4, creatinine 1.25, HCT 38.7 Labs (12/17): K 4.5, creatinine 1.49 => 1.62, BNP 43 Labs (2/18): K 3.8, creatinine 1.33, LFTs normal, TSH normal Labs (6/18): K 4.3, creatinine 1.49, LFTs normal, TSH normal, hgb 12.3 Labs (11/18): ESR 60, TSH normal, LFTs normal, creatinine 1.34 Labs (1/19): LDL 50 Labs (5/19): K 4.6, creatinine 1.55, LFTs normal, hgb 12.7, LFTs normal Labs (10/19): K 4 => 4.1, creatinine 1.39 => 1.43, BNP 279, hgb 10.7, TSH low, free T4 and free T3 low, ESR 108 Labs (3/21): K 4.5, creatinine 1.19, TSH normal Labs (4/21): K 4.8, creatinine 1.28, BNP 524, LDL 59 Labs (7/21): hgb 11.5, creatinine 1.4 Labs (8/21): K 4.3, creatinine 1.09 Labs (9/21): hgb 12.7 Labs (2/22): K 5, creatinine 1.54 Labs (5/22): K 4.2, creatinine 1.38, hgb 14.3, LDL 31 Labs (10/22): LDL 41, TGs 351, hgb 13.4, K 4.3, creatinine 1.57 Labs (1122): K 3.6, creatinine 1.32 Labs (1/23): K 4.0, creatinine 1.31, LDL 34 Labs (4/23): K 4.2, creatinine 1.66, LFTs normal, hgb 13.3, TSH normal Labs (5/23): K 3.2, creatinine 1.49 Labs (7/23): hgb 12 Labs (8/23): K 4.3, creatinine 1.76  PMH: 1. HTN: Fatigue and cough with ramipril use.  2. COPD: Quit smoking 2014. PFTs (1/18) with moderately severe COPD.  - CT chest (11/18): Emphysema noted, no ILD.  3. AAA: CT 1/13 with 3.0 cm AAA.  Abdominal US (1/14) with 3.4 cm AAA. Abdominal US (4/15) with 3.25 x 3.27 AAA. Abdominal US  (3/16) with 3.7 cm AAA.  Followed at [facility].  - Abd [name] (1/19): 3.3 cm AAA. 4. Squamous cell lung cancer diagnosed 2/12.  Had right lower lobectomy in 4/12.  5. Hyperlipidemia 6. PAD: Left fem-pop bypass 1994.  ABIs (2/12) 0.62 on right, 0.95 on left. ABIs (1/14): 0.65 on right, 1.04 on left. ABIs (4/15) 0.66 right, 0.87 left.  ABIs (3/16) 0.6 right, 0.88 left.  Patch angioplasty left fem-pop bypass in 11/16.   - Left fem-pop bypass patent on doppler evaluation in 12/17.  - ABIs (2/21): normal on left (1.02), moderately decreased on right (0.54).  - Peripheral arterial dopplers (3/23): >50% right iliac stenosis, ABIs 0.58 on right and 1.11 on left.  7. CAD: Stress myoview 2004 was normal. Lexiscan Cardiolite (3/14) with EF 66%, no ischemia or infarction. Lexiscan Cardiolite (7/17) with EF 61%, no ischemia/infarction.  - Cardiolite (12/17) with EF 62%, inferior/inferolateral fixed defect, most likely diaphragmatic attenuation, no  ischemia.  - Cardiolite (9/18) with EF > 65%, fixed inferior defect (attenuation versus infarction), no ischemia.  - LHC (11/18): No obstructive CAD.  - Cardiolite (6/21): EF 70%, no ischemia/infarction 8. Chronic diastolic CHF: Echo (9/21): technically difficult with EF 55-60%, upper normal RV size.  Echo (5/16) with EF 60-65%.  Echo (7/17) with EF 55-60%, normal RV size and systolic function.  - Echo 12/17 with very poor windows, unable to comment on LV or RV function.  - Cardiac MRI (1/18) with EF 66%, normal RV size and systolic function.  - TEE (5/19): EF 55-60%, normal RV size and systolic function.  - Echo (4/21): EF 60-65%, normal RV - Echo (10/22): EF 55-60%, moderate LVH, mildly decreased RV systolic function 9. Carotid stenosis: TIA 10/14.  Carotid dopplers with > 19% RICA, 41-74% LICA.  Patient had right CEA in 10/14. Carotids (4/15) patent right CEA, LICA 08-14% stenosis.  Carotids (48/18) < 56% LICA, patent right CEA.  - Carotid dopplers (12/17): right CEA ok, <  31% LICA stenosis.  - Carotid dopplers (1/19): Right CEA ok, 4-97% LICA stenosis.  - Carotid dopplers (2/21): Right CEA ok, LICA 02-63% stenosis.  - Carotid dopplers (3/23): Right CEA ok, LICA 7-85% 10. Atrial fibrillation: Paroxysmal. First noted after lobectomy in 4/12 (brief), recurrence in 4/15 then in 5/15.  - TEE-guided DCCV for persistent atrial fibrillation in 5/19.  - Atrial fibrillation ablation 7/20.  - Re-do atrial fibrillation ablation 5/21.  11. Spinal stenosis 12. OSA: Using CPAP.  13. Glaucoma 14. Has ILR 15. Colon cancer: Sigmoid adenocarcinoma, s/p surgical resection.  16. 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  17. COVID-19 10/22 18. Choledocholithiasis 19. Upper GI bleeding in 5/23 from duodenal ulcer.   SH: Married, lives in [place].  Former [place].  Quit smoking in 2014.  Rare ETOH now.   FH: No premature CAD  ROS: All systems reviewed and negative except as per HPI.   Current Outpatient Medications  Medication Sig Dispense Refill   acetaminophen (TYLENOL) 325 MG tablet Take 650 mg by mouth every 6 (six) hours as needed for moderate pain or headache.     albuterol (VENTOLIN HFA) 108 (90 Base) MCG/ACT inhaler Inhale 2 puffs into the lungs every 6 (six) hours as needed for wheezing or shortness of breath. 8.5 g 0   amLODipine (NORVASC) 5 MG tablet TAKE ONE TABLET BY MOUTH EVERY DAY 90 tablet 3   apixaban (ELIQUIS) 5 MG TABS tablet Take 1 tablet (5 mg total) by mouth 2 (two) times daily. 60 tablet 6   Brinzolamide-Brimonidine (SIMBRINZA) 1-0.2 % SUSP Apply 1 drop to eye in the morning, at noon, and at bedtime.     Budeson-Glycopyrrol-Formoterol (BREZTRI AEROSPHERE) 160-9-4.8 MCG/ACT AERO Inhale 2 puffs into the lungs in the morning and at bedtime. 10.7 g 0   docusate sodium (COLACE) 100 MG capsule Take 100 mg by mouth 2 (two) times daily.      escitalopram (LEXAPRO) 10 MG tablet TAKE ONE TABLET BY MOUTH EVERY DAY 28 tablet 5    furosemide (LASIX) 20 MG tablet Take 4 tablets (80 mg total) by mouth every morning AND 3 tablets (60 mg total) every evening. 450 tablet 3   JARDIANCE 10 MG TABS tablet TAKE ONE TABLET BY MOUTH EVERY DAY BEFORE BREAKFAST. 30 tablet 11   latanoprost (XALATAN) 0.005 % ophthalmic solution Place 1 drop into both eyes at bedtime.     losartan (COZAAR) 25 MG tablet TAKE ONE TABLET BY MOUTH  EVERY DAY 90 tablet 3   Melatonin 10 MG TABS Take 20 mg by mouth at bedtime.     metoprolol succinate (TOPROL-XL) 25 MG 24 hr tablet TAKE 1/2 TABLET EVERY EVENING WITH OR IMMEDIATELY FOLLOWING A MEAL 45 tablet 3   montelukast (SINGULAIR) 10 MG tablet TAKE ONE TABLET BY MOUTH AT BEDTIME 28 tablet 5   Multiple Vitamins-Minerals (CENTRUM SILVER PO) Take 1 tablet by mouth daily.     omeprazole (PRILOSEC) 20 MG capsule Take 1 capsule (20 mg total) by mouth 2 (two) times daily. 60 capsule 3   ondansetron (ZOFRAN) 4 MG tablet Take 1 tablet (4 mg total) by mouth every 6 (six) hours as needed for nausea. 20 tablet 0   OVER THE COUNTER MEDICATION Take 3 capsules by mouth daily. Balance of Nature Fruits     OVER THE COUNTER MEDICATION Take 3 capsules by mouth daily. Balance of Nature Vegetables     pantoprazole (PROTONIX) 40 MG tablet Take 1 tablet (40 mg total) by mouth 2 (two) times daily. 60 tablet 0   potassium chloride SA (KLOR-CON M) 20 MEQ tablet TAKE TWO TABLETS BY MOUTH IN THE MORNING AND TAKE ONE TABLET IN THE EVENING 90 tablet 6   rosuvastatin (CRESTOR) 40 MG tablet TAKE ONE TABLET BY MOUTH EVERY DAY 90 tablet 3   senna (SENOKOT) 8.6 MG tablet Take 1 tablet by mouth 2 (two) times daily.     vitamin B-12 (CYANOCOBALAMIN) 1000 MCG tablet Take 1 tablet (1,000 mcg total) by mouth daily. 30 tablet 0   No current facility-administered medications for this encounter.   Wt Readings from Last 3 Encounters:  06/26/22 87.9 kg (193 lb 12.8 oz)  05/16/22 88.9 kg (196 lb)  05/15/22 90.6 kg (199 lb 12.8 oz)   BP (!) 120/50    Pulse (!) 53   Wt 87.9 kg (193 lb 12.8 oz)   SpO2 93%   BMI 25.57 kg/m    Physical Exam General:  NAD. No resp difficulty, walked into clinic HEENT: Normal Neck: Supple. No JVD. Carotids 2+ bilat; no bruits. No lymphadenopathy or thryomegaly appreciated. Cor: PMI nondisplaced. Irregular rate & rhythm. No rubs, gallops or murmurs. Lungs: Clear, absent BS RLL Abdomen: Soft, nontender, nondistended. No hepatosplenomegaly. No bruits or masses. Good bowel sounds. Extremities: No cyanosis, clubbing, rash, edema Neuro: Alert & oriented x 3, cranial nerves grossly intact. Moves all 4 extremities w/o difficulty. Affect pleasant.  Assessment/Plan: 1. Chronic diastolic CHF: Echo in 56/81 with EF 55-60%, mild LVH, mildly dysfunctional RV.  Chronic NYHA class III symptoms, he is not volume overloaded today, weight down 6 lbs.   - Continue Lasix 80 qam/60 qpm.  BMET today. - Continue KCl 40 qam/20 qpm.  - Continue Jardiance 10 mg daily.  - Continue losartan 25 mg daily. - Continue Toprol XL 12.5 mg daily. 2. Hyperlipidemia: 1/23 lipids with good LDL. - Continue Crestor 40 mg daily.  - Continue Vascepa 2 g bid. 3. Carotid stenosis: s/p R CEA.  Dopplers followed at [facility].  4. PAD: s/p patch angioplasty to left fem-pop bypass in 11/16.  Followed at [facility].  Peripheral arterial dopplers in 3/23 with >50% right iliac stenosis, but no rest pain or pedal ulcerations.  Rare mild claudication.  - Follows with [facility].  5. AAA: Stable on last [name], followed at [facility].   6. COPD: No longer smoking. PFTs in 1/18 showed moderately severe obstructive defect. CT chest in 11/18 showed emphysema.  COPD contributes to his dyspnea.  Oxygen saturation 93% when walking in today.  - Recommended that he get a pulse oximeter and check oxygen saturation at home.  He may benefit from home oxygen, at least with exertion.  7. Atrial fibrillation: S/p atrial fibrillation ablation in 7/20 and redo in 5/21.  He has been primarily in NSR since  redo ablation.  He is actually in slow AF today, he has not noticed this. Discussed risks/benefits of cardioversion versus focusing on rate control strategy. He would like to proceed with DCCV, will arrange. Discussed with [name]. - Continue Toprol XL 12.5 mg daily.  - Continue Eliquis 5 mg bid. He has not missed any doses. - Continue to limit ETOH.  - Continue to use CPAP. Advised her call Dr. Theodosia Blender office regarding new equipment. - Concern for his ability to maintain NSR. Consider referral back to EP after cardioversion. 8. CKD: Stage 3.  BMET today.  9. CAD: LHC in 11/18 with nonobstructive disease only.  Cardiolite in 6/21 with no ischemia/infarction. No chest pain.  10. 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  He is no longer on methimazole, follows with endocrinology.     11. Colon cancer: Sigmoid adenocarcinoma, s/p surgical resection. In remission.  12. HTN: Stable.   13. Pleural effusion: Chronic right pleural effusion, s/p right lower lobectomy.   14. Squamous cell lung cancer: S/p right lower lobectomy.  Follows with Dr. Valeta Harms.  15. Upper GI bleeding: In 5/23 due to duodenal ulcer. No abnormal bleeding. - Continue PPI.  - Check CBC today.   Follow up with APP 2-3 weeks after cardioversion.  Maricela Bo [facility] FNP-BC 06/26/2022

## 2022-06-26 NOTE — Progress Notes (Signed)
Patient ID: Troy Brodzinski [name], male   DOB: Oct 14, 1940, 81 y.o.   MRN: [id number] PCP: Dr. Quay Burow Cardiology: [name]  81 y.o. with history of HTN, COPD, active smoking/COPD, carotid stenosis s/p right CEA, paroxysmal atrial fibrillation, and PAD.   He does not have known obstructive CAD but is at high risk for CAD based on his comorbidities.  Lexiscan Cardiolite in 3/14 showed no ischemia or infarction and echo in 3/14 showed normal EF.  He had a left fem-pop bypass at the [facility] in the '90s.  He is followed at [facility] for PAD. He has chronic right calf/thigh/buttocks claudication that is unchanged over the last few years and follows regularly at [facility].  He has had right lower lobectomy for lung cancer.  He continues to stay off cigarettes.    At a prior appointment, he was in atrial fibrillation but did not realize it. Dr Aundra Dubin  started him on Eliquis and diltiazem CD, and he spontaneously converted to NSR.  In 5/15, he was at [facility] and felt "strange" one day: fatigued, weak, short of breath.  He went to the ER and was in atrial fibrillation with HR in 80s-90s.  He spontaneously converted to NSR in the ER.  He felt back to normal after converting to NSR.  Started on Multaq 400 mg bid.  With CHF, he was eventually transitioned over to amiodarone.   He had patch angioplasty revision of left fem-pop bypass in 11/16.  Now with minimal claudication.  He follows with [facility].     He had a Cardiolite and echo in 7/17 that were unremarkable.  Repeat Cardiolite in 12/17 showed no ischemia, echo was uninterpretable.  Cardiac MRI was therefore done in 1/18, showing EF 66% with normal-appearing RV, no late gadolinium enhancement.   Given increased exertional dyspnea and a defect on Cardiolite, he had LHC in 11/18.  This showed no obstructive CAD. He had a chest CT done given concern for possible amiodarone lung toxicity with increased dyspnea. This showed emphysema, no ILD.    In 5/19, he developed a  probable viral syndrome with dehydration and had a presyncopal episode.  He was orthostatic in the hospital.  He was noted to be in atrial fibrillation this admission which remained persistent.  He had a TEE-guided DCCV back to NSR.   In 9/19, he was admitted with GI bleeding and found to have a sigmoid mass on colonoscopy, path showed sigmoid adenocarcinoma.  He was noted to have right paratracheal lymphadenopathy but EUS with biopsy in 10/19 did not show malignancy.  He had surgical resection of colon cancer.   Amiodarone was stopped with elevated ESR and increased dyspnea.  He was also found to have hyperthyroidism. He was treated with methimazole by endocrinology.    He had atrial fibrillation ablation in 7/20.   Echo in 4/21 showed EF 60-65%, normal RV.    In 5/21, due to high atrial fibrillation burden, he had redo atrial fibrillation ablation.   Cardiolite in 6/21 showed EF 70%, no ischemia/infarction.   He was admitted in 10/22 with COVID-19 and COPD exacerbation.  He had a thoracentesis, cytology was negative for cancerous cells.  Echo in 10/22 showed EF 55-60%, moderate LVH, mildly decreased RV systolic function.   He was found to have choledocholithiasis with elevated LFTs, had ERCP with sphincterotomy in 5/23, refused cholecystectomy.  He was also admitted in 5/23 with upper GI bleeding.  He was found to have a duodenal ulcer on EGD.  Follow up 8/23, found to be in atrial fibrillation with slow ventricular rate. Mildly volume overloaded and instructed to increase Lasix to 80 bid x 3 days then Lasix 80/60.  Today he returns for HF follow up. Overall feeling fine. He continues to work out with a Physiological scientist 3x/week. He has dyspnea walking further distances on flat ground. Denies palpitations, abnormal bleeding, CP, dizziness, edema, or PND/Orthopnea. Appetite ok. No fever or chills. Weight at home 188-190 pounds. Taking all medications. No bothersome leg pain. Wakes frequently at  night to void.  ECG (personally reviewed): atrial fibrillation, 56 bpm  Labs (3/13): LDL 75, HDL 35, K 4.1, creatinine 1.1 Labs (3/14): LDL 91, HDL 27 Labs (10/14): K 4.3, creatinine 0.98 Labs (4/15): K 4.9, creatinine 1.1, LDL 76, HDL 30 Labs (5/15): K 4.3, creatinine 1.7, BNP 251 Labs (6/15): K 4.4, creatinine 1.3 Labs (10/15): TSH normal Labs (5/16): K 4.2, creatinine 1.12, HCT 39.5, LFTs normal, LDL 84, HDL 43, TSH normal Labs (11/16): creatinine 0.94 Labs (4/17): LDL 39, HDL 39 Labs (6/17): K 4.5, creatinine 1.2 Labs (9/17): K 4, creatinine 1.25, HCT 38.7 Labs (12/17): K 4.5, creatinine 1.49 => 1.62, BNP 43 Labs (2/18): K 3.8, creatinine 1.33, LFTs normal, TSH normal Labs (6/18): K 4.3, creatinine 1.49, LFTs normal, TSH normal, hgb 12.3 Labs (11/18): ESR 60, TSH normal, LFTs normal, creatinine 1.34 Labs (1/19): LDL 50 Labs (5/19): K 4.6, creatinine 1.55, LFTs normal, hgb 12.7, LFTs normal Labs (10/19): K 4 => 4.1, creatinine 1.39 => 1.43, BNP 279, hgb 10.7, TSH low, free T4 and free T3 low, ESR 108 Labs (3/21): K 4.5, creatinine 1.19, TSH normal Labs (4/21): K 4.8, creatinine 1.28, BNP 524, LDL 59 Labs (7/21): hgb 11.5, creatinine 1.4 Labs (8/21): K 4.3, creatinine 1.09 Labs (9/21): hgb 12.7 Labs (2/22): K 5, creatinine 1.54 Labs (5/22): K 4.2, creatinine 1.38, hgb 14.3, LDL 31 Labs (10/22): LDL 41, TGs 351, hgb 13.4, K 4.3, creatinine 1.57 Labs (1122): K 3.6, creatinine 1.32 Labs (1/23): K 4.0, creatinine 1.31, LDL 34 Labs (4/23): K 4.2, creatinine 1.66, LFTs normal, hgb 13.3, TSH normal Labs (5/23): K 3.2, creatinine 1.49 Labs (7/23): hgb 12 Labs (8/23): K 4.3, creatinine 1.76  PMH: 1. HTN: Fatigue and cough with ramipril use.  2. COPD: Quit smoking 2014. PFTs (1/18) with moderately severe COPD.  - CT chest (11/18): Emphysema noted, no ILD.  3. AAA: CT 1/13 with 3.0 cm AAA.  Abdominal US (1/14) with 3.4 cm AAA. Abdominal US (4/15) with 3.25 x 3.27 AAA. Abdominal US  (3/16) with 3.7 cm AAA.  Followed at [facility].  - Abd [name] (1/19): 3.3 cm AAA. 4. Squamous cell lung cancer diagnosed 2/12.  Had right lower lobectomy in 4/12.  5. Hyperlipidemia 6. PAD: Left fem-pop bypass 1994.  ABIs (2/12) 0.62 on right, 0.95 on left. ABIs (1/14): 0.65 on right, 1.04 on left. ABIs (4/15) 0.66 right, 0.87 left.  ABIs (3/16) 0.6 right, 0.88 left.  Patch angioplasty left fem-pop bypass in 11/16.   - Left fem-pop bypass patent on doppler evaluation in 12/17.  - ABIs (2/21): normal on left (1.02), moderately decreased on right (0.54).  - Peripheral arterial dopplers (3/23): >50% right iliac stenosis, ABIs 0.58 on right and 1.11 on left.  7. CAD: Stress myoview 2004 was normal. Lexiscan Cardiolite (3/14) with EF 66%, no ischemia or infarction. Lexiscan Cardiolite (7/17) with EF 61%, no ischemia/infarction.  - Cardiolite (12/17) with EF 62%, inferior/inferolateral fixed defect, most likely diaphragmatic attenuation, no  ischemia.  - Cardiolite (9/18) with EF > 65%, fixed inferior defect (attenuation versus infarction), no ischemia.  - LHC (11/18): No obstructive CAD.  - Cardiolite (6/21): EF 70%, no ischemia/infarction 8. Chronic diastolic CHF: Echo (9/21): technically difficult with EF 55-60%, upper normal RV size.  Echo (5/16) with EF 60-65%.  Echo (7/17) with EF 55-60%, normal RV size and systolic function.  - Echo 12/17 with very poor windows, unable to comment on LV or RV function.  - Cardiac MRI (1/18) with EF 66%, normal RV size and systolic function.  - TEE (5/19): EF 55-60%, normal RV size and systolic function.  - Echo (4/21): EF 60-65%, normal RV - Echo (10/22): EF 55-60%, moderate LVH, mildly decreased RV systolic function 9. Carotid stenosis: TIA 10/14.  Carotid dopplers with > 19% RICA, 41-74% LICA.  Patient had right CEA in 10/14. Carotids (4/15) patent right CEA, LICA 08-14% stenosis.  Carotids (48/18) < 56% LICA, patent right CEA.  - Carotid dopplers (12/17): right CEA ok, <  31% LICA stenosis.  - Carotid dopplers (1/19): Right CEA ok, 4-97% LICA stenosis.  - Carotid dopplers (2/21): Right CEA ok, LICA 02-63% stenosis.  - Carotid dopplers (3/23): Right CEA ok, LICA 7-85% 10. Atrial fibrillation: Paroxysmal. First noted after lobectomy in 4/12 (brief), recurrence in 4/15 then in 5/15.  - TEE-guided DCCV for persistent atrial fibrillation in 5/19.  - Atrial fibrillation ablation 7/20.  - Re-do atrial fibrillation ablation 5/21.  11. Spinal stenosis 12. OSA: Using CPAP.  13. Glaucoma 14. Has ILR 15. Colon cancer: Sigmoid adenocarcinoma, s/p surgical resection.  16. 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  17. COVID-19 10/22 18. Choledocholithiasis 19. Upper GI bleeding in 5/23 from duodenal ulcer.   SH: Married, lives in [place].  Former [place].  Quit smoking in 2014.  Rare ETOH now.   FH: No premature CAD  ROS: All systems reviewed and negative except as per HPI.   Current Outpatient Medications  Medication Sig Dispense Refill   acetaminophen (TYLENOL) 325 MG tablet Take 650 mg by mouth every 6 (six) hours as needed for moderate pain or headache.     albuterol (VENTOLIN HFA) 108 (90 Base) MCG/ACT inhaler Inhale 2 puffs into the lungs every 6 (six) hours as needed for wheezing or shortness of breath. 8.5 g 0   amLODipine (NORVASC) 5 MG tablet TAKE ONE TABLET BY MOUTH EVERY DAY 90 tablet 3   apixaban (ELIQUIS) 5 MG TABS tablet Take 1 tablet (5 mg total) by mouth 2 (two) times daily. 60 tablet 6   Brinzolamide-Brimonidine (SIMBRINZA) 1-0.2 % SUSP Apply 1 drop to eye in the morning, at noon, and at bedtime.     Budeson-Glycopyrrol-Formoterol (BREZTRI AEROSPHERE) 160-9-4.8 MCG/ACT AERO Inhale 2 puffs into the lungs in the morning and at bedtime. 10.7 g 0   docusate sodium (COLACE) 100 MG capsule Take 100 mg by mouth 2 (two) times daily.      escitalopram (LEXAPRO) 10 MG tablet TAKE ONE TABLET BY MOUTH EVERY DAY 28 tablet 5    furosemide (LASIX) 20 MG tablet Take 4 tablets (80 mg total) by mouth every morning AND 3 tablets (60 mg total) every evening. 450 tablet 3   JARDIANCE 10 MG TABS tablet TAKE ONE TABLET BY MOUTH EVERY DAY BEFORE BREAKFAST. 30 tablet 11   latanoprost (XALATAN) 0.005 % ophthalmic solution Place 1 drop into both eyes at bedtime.     losartan (COZAAR) 25 MG tablet TAKE ONE TABLET BY MOUTH  EVERY DAY 90 tablet 3   Melatonin 10 MG TABS Take 20 mg by mouth at bedtime.     metoprolol succinate (TOPROL-XL) 25 MG 24 hr tablet TAKE 1/2 TABLET EVERY EVENING WITH OR IMMEDIATELY FOLLOWING A MEAL 45 tablet 3   montelukast (SINGULAIR) 10 MG tablet TAKE ONE TABLET BY MOUTH AT BEDTIME 28 tablet 5   Multiple Vitamins-Minerals (CENTRUM SILVER PO) Take 1 tablet by mouth daily.     omeprazole (PRILOSEC) 20 MG capsule Take 1 capsule (20 mg total) by mouth 2 (two) times daily. 60 capsule 3   ondansetron (ZOFRAN) 4 MG tablet Take 1 tablet (4 mg total) by mouth every 6 (six) hours as needed for nausea. 20 tablet 0   OVER THE COUNTER MEDICATION Take 3 capsules by mouth daily. Balance of Nature Fruits     OVER THE COUNTER MEDICATION Take 3 capsules by mouth daily. Balance of Nature Vegetables     pantoprazole (PROTONIX) 40 MG tablet Take 1 tablet (40 mg total) by mouth 2 (two) times daily. 60 tablet 0   potassium chloride SA (KLOR-CON M) 20 MEQ tablet TAKE TWO TABLETS BY MOUTH IN THE MORNING AND TAKE ONE TABLET IN THE EVENING 90 tablet 6   rosuvastatin (CRESTOR) 40 MG tablet TAKE ONE TABLET BY MOUTH EVERY DAY 90 tablet 3   senna (SENOKOT) 8.6 MG tablet Take 1 tablet by mouth 2 (two) times daily.     vitamin B-12 (CYANOCOBALAMIN) 1000 MCG tablet Take 1 tablet (1,000 mcg total) by mouth daily. 30 tablet 0   No current facility-administered medications for this encounter.   Wt Readings from Last 3 Encounters:  06/26/22 87.9 kg (193 lb 12.8 oz)  05/16/22 88.9 kg (196 lb)  05/15/22 90.6 kg (199 lb 12.8 oz)   BP (!) 120/50    Pulse (!) 53   Wt 87.9 kg (193 lb 12.8 oz)   SpO2 93%   BMI 25.57 kg/m    Physical Exam General:  NAD. No resp difficulty, walked into clinic HEENT: Normal Neck: Supple. No JVD. Carotids 2+ bilat; no bruits. No lymphadenopathy or thryomegaly appreciated. Cor: PMI nondisplaced. Irregular rate & rhythm. No rubs, gallops or murmurs. Lungs: Clear, absent BS RLL Abdomen: Soft, nontender, nondistended. No hepatosplenomegaly. No bruits or masses. Good bowel sounds. Extremities: No cyanosis, clubbing, rash, edema Neuro: Alert & oriented x 3, cranial nerves grossly intact. Moves all 4 extremities w/o difficulty. Affect pleasant.  Assessment/Plan: 1. Chronic diastolic CHF: Echo in 56/81 with EF 55-60%, mild LVH, mildly dysfunctional RV.  Chronic NYHA class III symptoms, he is not volume overloaded today, weight down 6 lbs.   - Continue Lasix 80 qam/60 qpm.  BMET today. - Continue KCl 40 qam/20 qpm.  - Continue Jardiance 10 mg daily.  - Continue losartan 25 mg daily. - Continue Toprol XL 12.5 mg daily. 2. Hyperlipidemia: 1/23 lipids with good LDL. - Continue Crestor 40 mg daily.  - Continue Vascepa 2 g bid. 3. Carotid stenosis: s/p R CEA.  Dopplers followed at [facility].  4. PAD: s/p patch angioplasty to left fem-pop bypass in 11/16.  Followed at [facility].  Peripheral arterial dopplers in 3/23 with >50% right iliac stenosis, but no rest pain or pedal ulcerations.  Rare mild claudication.  - Follows with [facility].  5. AAA: Stable on last [name], followed at [facility].   6. COPD: No longer smoking. PFTs in 1/18 showed moderately severe obstructive defect. CT chest in 11/18 showed emphysema.  COPD contributes to his dyspnea.  Oxygen saturation 93% when walking in today.  - Recommended that he get a pulse oximeter and check oxygen saturation at home.  He may benefit from home oxygen, at least with exertion.  7. Atrial fibrillation: S/p atrial fibrillation ablation in 7/20 and redo in 5/21.  He has been primarily in NSR since  redo ablation.  He is actually in slow AF today, he has not noticed this. Discussed risks/benefits of cardioversion versus focusing on rate control strategy. He would like to proceed with DCCV, will arrange. Discussed with [name]. - Continue Toprol XL 12.5 mg daily.  - Continue Eliquis 5 mg bid. He has not missed any doses. - Continue to limit ETOH.  - Continue to use CPAP. Advised her call Dr. Theodosia Blender office regarding new equipment. - Concern for his ability to maintain NSR. Consider referral back to EP after cardioversion. 8. CKD: Stage 3.  BMET today.  9. CAD: LHC in 11/18 with nonobstructive disease only.  Cardiolite in 6/21 with no ischemia/infarction. No chest pain.  10. 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  He is no longer on methimazole, follows with endocrinology.     11. Colon cancer: Sigmoid adenocarcinoma, s/p surgical resection. In remission.  12. HTN: Stable.   13. Pleural effusion: Chronic right pleural effusion, s/p right lower lobectomy.   14. Squamous cell lung cancer: S/p right lower lobectomy.  Follows with Dr. Valeta Harms.  15. Upper GI bleeding: In 5/23 due to duodenal ulcer. No abnormal bleeding. - Continue PPI.  - Check CBC today.   Follow up with APP 2-3 weeks after cardioversion.  Maricela Bo [facility] FNP-BC 06/26/2022

## 2022-07-09 ENCOUNTER — Ambulatory Visit (INDEPENDENT_AMBULATORY_CARE_PROVIDER_SITE_OTHER): Payer: PPO

## 2022-07-09 DIAGNOSIS — I480 Paroxysmal atrial fibrillation: Secondary | ICD-10-CM

## 2022-07-10 ENCOUNTER — Ambulatory Visit (HOSPITAL_COMMUNITY): Payer: PPO | Admitting: Certified Registered Nurse Anesthetist

## 2022-07-10 ENCOUNTER — Ambulatory Visit (HOSPITAL_BASED_OUTPATIENT_CLINIC_OR_DEPARTMENT_OTHER): Payer: PPO | Admitting: Certified Registered Nurse Anesthetist

## 2022-07-10 ENCOUNTER — Other Ambulatory Visit (HOSPITAL_COMMUNITY): Payer: Self-pay

## 2022-07-10 ENCOUNTER — Encounter (HOSPITAL_COMMUNITY)
Admission: RE | Disposition: A | Discharge status: Home / Self Care | Payer: Self-pay | Source: Home / Self Care | Attending: Cardiology

## 2022-07-10 ENCOUNTER — Encounter: Payer: Self-pay | Admitting: Internal Medicine

## 2022-07-10 ENCOUNTER — Ambulatory Visit (HOSPITAL_COMMUNITY)
Admission: RE | Admit: 2022-07-10 | Discharge: 2022-07-10 | Disposition: A | Discharge status: Home / Self Care | Payer: PPO | Attending: Cardiology | Admitting: Cardiology

## 2022-07-10 ENCOUNTER — Other Ambulatory Visit: Payer: Self-pay

## 2022-07-10 ENCOUNTER — Encounter (HOSPITAL_COMMUNITY): Payer: Self-pay | Admitting: Cardiology

## 2022-07-10 ENCOUNTER — Telehealth (HOSPITAL_COMMUNITY): Payer: Self-pay | Admitting: *Deleted

## 2022-07-10 DIAGNOSIS — Z7984 Long term (current) use of oral hypoglycemic drugs: Secondary | ICD-10-CM | POA: Diagnosis not present

## 2022-07-10 DIAGNOSIS — J439 Emphysema, unspecified: Secondary | ICD-10-CM | POA: Diagnosis not present

## 2022-07-10 DIAGNOSIS — Z87891 Personal history of nicotine dependence: Secondary | ICD-10-CM | POA: Diagnosis not present

## 2022-07-10 DIAGNOSIS — I110 Hypertensive heart disease with heart failure: Secondary | ICD-10-CM

## 2022-07-10 DIAGNOSIS — F418 Other specified anxiety disorders: Secondary | ICD-10-CM | POA: Diagnosis not present

## 2022-07-10 DIAGNOSIS — Z7901 Long term (current) use of anticoagulants: Secondary | ICD-10-CM | POA: Diagnosis not present

## 2022-07-10 DIAGNOSIS — Z800 Family history of malignant neoplasm of digestive organs: Secondary | ICD-10-CM | POA: Insufficient documentation

## 2022-07-10 DIAGNOSIS — Z85118 Personal history of other malignant neoplasm of bronchus and lung: Secondary | ICD-10-CM | POA: Insufficient documentation

## 2022-07-10 DIAGNOSIS — I2510 Atherosclerotic heart disease of native coronary artery without angina pectoris: Secondary | ICD-10-CM

## 2022-07-10 DIAGNOSIS — Z79899 Other long term (current) drug therapy: Secondary | ICD-10-CM | POA: Diagnosis not present

## 2022-07-10 DIAGNOSIS — I480 Paroxysmal atrial fibrillation: Secondary | ICD-10-CM

## 2022-07-10 DIAGNOSIS — I4891 Unspecified atrial fibrillation: Secondary | ICD-10-CM | POA: Diagnosis not present

## 2022-07-10 DIAGNOSIS — I5032 Chronic diastolic (congestive) heart failure: Secondary | ICD-10-CM

## 2022-07-10 DIAGNOSIS — Z902 Acquired absence of lung [part of]: Secondary | ICD-10-CM | POA: Diagnosis not present

## 2022-07-10 DIAGNOSIS — N183 Chronic kidney disease, stage 3 unspecified: Secondary | ICD-10-CM | POA: Diagnosis not present

## 2022-07-10 DIAGNOSIS — I130 Hypertensive heart and chronic kidney disease with heart failure and stage 1 through stage 4 chronic kidney disease, or unspecified chronic kidney disease: Secondary | ICD-10-CM | POA: Insufficient documentation

## 2022-07-10 DIAGNOSIS — E785 Hyperlipidemia, unspecified: Secondary | ICD-10-CM | POA: Insufficient documentation

## 2022-07-10 DIAGNOSIS — J449 Chronic obstructive pulmonary disease, unspecified: Secondary | ICD-10-CM

## 2022-07-10 DIAGNOSIS — E1151 Type 2 diabetes mellitus with diabetic peripheral angiopathy without gangrene: Secondary | ICD-10-CM | POA: Insufficient documentation

## 2022-07-10 DIAGNOSIS — I4819 Other persistent atrial fibrillation: Secondary | ICD-10-CM | POA: Insufficient documentation

## 2022-07-10 DIAGNOSIS — I509 Heart failure, unspecified: Secondary | ICD-10-CM

## 2022-07-10 HISTORY — PX: CARDIOVERSION: SHX1299

## 2022-07-10 LAB — CUP PACEART REMOTE DEVICE CHECK
Date Time Interrogation Session: 20231009232446
Implantable Pulse Generator Implant Date: 20200709

## 2022-07-10 LAB — BASIC METABOLIC PANEL
Anion gap: 7 (ref 5–15)
BUN: 25 mg/dL — ABNORMAL HIGH (ref 8–23)
CO2: 26 mmol/L (ref 22–32)
Calcium: 9 mg/dL (ref 8.9–10.3)
Chloride: 104 mmol/L (ref 98–111)
Creatinine, Ser: 2.19 mg/dL — ABNORMAL HIGH (ref 0.61–1.24)
GFR, Estimated: 30 mL/min — ABNORMAL LOW (ref >60)
Glucose, Bld: 138 mg/dL — ABNORMAL HIGH (ref 70–99)
Potassium: 4.9 mmol/L (ref 3.5–5.1)
Sodium: 137 mmol/L (ref 135–145)

## 2022-07-10 SURGERY — CARDIOVERSION
Anesthesia: Monitor Anesthesia Care

## 2022-07-10 MED ORDER — SODIUM CHLORIDE 0.9 % IV SOLN
INTRAVENOUS | Status: DC
Start: 2022-07-10 — End: 2022-07-10

## 2022-07-10 MED ORDER — APIXABAN 5 MG PO TABS: ORAL_TABLET | ORAL | 6 refills | Status: DC

## 2022-07-10 MED ORDER — APIXABAN 5 MG PO TABS: 5.0000 mg | ORAL_TABLET | Freq: Once | ORAL | Status: DC

## 2022-07-10 MED ORDER — LIDOCAINE 2% (20 MG/ML) 5 ML SYRINGE
INTRAMUSCULAR | Status: DC | PRN
  Administered 2022-07-10: 80 mg via INTRAVENOUS

## 2022-07-10 MED ORDER — APIXABAN 2.5 MG PO TABS
2.5000 mg | ORAL_TABLET | Freq: Once | ORAL | Status: AC
  Administered 2022-07-10: 2.5 mg via ORAL
  Filled 2022-07-10: qty 1

## 2022-07-10 MED ORDER — FUROSEMIDE 20 MG PO TABS: 60.0000 mg | ORAL_TABLET | Freq: Two times a day (BID) | ORAL | 3 refills | Status: DC

## 2022-07-10 MED ORDER — PROPOFOL 10 MG/ML IV BOLUS
INTRAVENOUS | Status: DC | PRN
  Administered 2022-07-10: 90 mg via INTRAVENOUS

## 2022-07-10 NOTE — Telephone Encounter (Signed)
-----   Message from Larey Dresser, MD sent at 07/10/2022  1:06 PM EDT ----- 1. Hold Lasix for 2 days, then decrease to 60 mg bid.  2. Stop losartan.  3. Decrease Eliquis to 2.5 mg bid.  4. BMET in 1 week.

## 2022-07-10 NOTE — Procedures (Signed)
Electrical Cardioversion Procedure Note Troy [name] [id number] 01/29/1941  Procedure: Electrical Cardioversion Indications:  Atrial Fibrillation  Procedure Details Consent: Risks of procedure as well as the alternatives and risks of each were explained to the (patient/caregiver).  Consent for procedure obtained. Time Out: Verified patient identification, verified procedure, site/side was marked, verified correct patient position, special equipment/implants available, medications/allergies/relevent history reviewed, required imaging and test results available.  Performed  Patient placed on cardiac monitor, pulse oximetry, supplemental oxygen as necessary.  Sedation given:  Propofol per anesthesiology Pacer pads placed anterior and posterior chest.  Cardioverted 2 time(s).  Cardioverted at [place].  Evaluation Findings: Post procedure EKG shows: NSR with low voltage, abnormal-appearing P-waves.  Complications: None Patient did tolerate procedure well.  Will get BMET today to determine Eliquis dose.    [name] 07/10/2022, 11:20 AM

## 2022-07-10 NOTE — Patient Instructions (Addendum)
     Your sugars are well controlled    Medications changes include :   none    Return in about 6 months (around 01/10/2023) for follow up.

## 2022-07-10 NOTE — Transfer of Care (Signed)
Immediate Anesthesia Transfer of Care Note  Patient: Troy Pierron [name]  Procedure(s) Performed: CARDIOVERSION TRANSESOPHAGEAL ECHOCARDIOGRAM (TEE)  Patient Location: Endoscopy Unit  Anesthesia Type:General  Level of Consciousness: awake, alert , oriented, patient cooperative, and responds to stimulation  Airway & Oxygen Therapy: Patient Spontanous Breathing  Post-op Assessment: Report given to RN and Post -op Vital signs reviewed and stable  Post vital signs: Reviewed and stable  Last Vitals:  Vitals Value Taken Time  BP    Temp    Pulse    Resp    SpO2      Last Pain:  Vitals:   07/10/22 0911  TempSrc: Temporal  PainSc: 0-No pain         Complications: No notable events documented.

## 2022-07-10 NOTE — Anesthesia Preprocedure Evaluation (Signed)
Anesthesia Evaluation  Patient identified by MRN, date of birth, ID band Patient awake    Reviewed: Allergy & Precautions, H&P , NPO status , Patient's Chart, lab work & pertinent test results  Airway Mallampati: II   Neck ROM: full    Dental   Pulmonary sleep apnea , COPD, former smoker,    breath sounds clear to auscultation       Cardiovascular hypertension, + CAD, + Peripheral Vascular Disease and +CHF  + dysrhythmias Atrial Fibrillation  Rhythm:irregular Rate:Normal     Neuro/Psych PSYCHIATRIC DISORDERS Anxiety Depression TIA   GI/Hepatic PUD, GERD  ,  Endo/Other  diabetes, Type 2  Renal/GU Renal InsufficiencyRenal disease     Musculoskeletal  (+) Arthritis ,   Abdominal   Peds  Hematology   Anesthesia Other Findings   Reproductive/Obstetrics                             Anesthesia Physical Anesthesia Plan  ASA: 3  Anesthesia Plan: MAC   Post-op Pain Management:    Induction: Intravenous  PONV Risk Score and Plan: 1 and Propofol infusion and Treatment may vary due to age or medical condition  Airway Management Planned: Nasal Cannula  Additional Equipment:   Intra-op Plan:   Post-operative Plan:   Informed Consent: I have reviewed the patients History and Physical, chart, labs and discussed the procedure including the risks, benefits and alternatives for the proposed anesthesia with the patient or authorized representative who has indicated his/her understanding and acceptance.     Dental advisory given  Plan Discussed with: CRNA, Anesthesiologist and Surgeon  Anesthesia Plan Comments:         Anesthesia Quick Evaluation

## 2022-07-10 NOTE — Progress Notes (Unsigned)
Subjective:    Patient ID: Troy Kindred [name], male    DOB: [birth year], 81 y.o.   MRN: [id number]     HPI Troy [name] is here for follow up of his chronic medical problems, including htn, hld, DM, depression, CKD  S/p cardioversion yesterday - SOB is a little better.  He is hoping that will continue to improve and he will feel better.  Not following with nephrology-he states he was seen nephrology, but not sure what happened Has not seen them now  Medications and allergies reviewed with patient and updated if appropriate.  Current Outpatient Medications on File Prior to Visit  Medication Sig Dispense Refill   acetaminophen (TYLENOL) 325 MG tablet Take 650 mg by mouth every 6 (six) hours as needed for moderate pain or headache.     albuterol (VENTOLIN HFA) 108 (90 Base) MCG/ACT inhaler Inhale 2 puffs into the lungs every 6 (six) hours as needed for wheezing or shortness of breath. 8.5 g 0   amLODipine (NORVASC) 5 MG tablet TAKE ONE TABLET BY MOUTH EVERY DAY 90 tablet 3   apixaban (ELIQUIS) 5 MG TABS tablet Take 1/2 tab Twice daily 60 tablet 6   Brinzolamide-Brimonidine (SIMBRINZA) 1-0.2 % SUSP Place 1 drop into both eyes 3 (three) times daily.     Budeson-Glycopyrrol-Formoterol (BREZTRI AEROSPHERE) 160-9-4.8 MCG/ACT AERO Inhale 2 puffs into the lungs in the morning and at bedtime. (Patient taking differently: Inhale 2 puffs into the lungs daily.) 10.7 g 0   docusate sodium (COLACE) 100 MG capsule Take 100 mg by mouth 2 (two) times daily.      escitalopram (LEXAPRO) 10 MG tablet TAKE ONE TABLET BY MOUTH EVERY DAY 28 tablet 5   furosemide (LASIX) 20 MG tablet Take 3 tablets (60 mg total) by mouth 2 (two) times daily. 450 tablet 3   JARDIANCE 10 MG TABS tablet TAKE ONE TABLET BY MOUTH EVERY DAY BEFORE BREAKFAST. 30 tablet 11   Melatonin 10 MG TABS Take 20 mg by mouth at bedtime.     metoprolol succinate (TOPROL-XL) 25 MG 24 hr tablet TAKE 1/2 TABLET EVERY EVENING WITH OR IMMEDIATELY  FOLLOWING A MEAL 45 tablet 3   montelukast (SINGULAIR) 10 MG tablet TAKE ONE TABLET BY MOUTH AT BEDTIME 28 tablet 5   Multiple Vitamins-Minerals (CENTRUM SILVER PO) Take 1 tablet by mouth daily.     omeprazole (PRILOSEC) 20 MG capsule Take 1 capsule (20 mg total) by mouth 2 (two) times daily. 60 capsule 3   ondansetron (ZOFRAN) 4 MG tablet Take 1 tablet (4 mg total) by mouth every 6 (six) hours as needed for nausea. 20 tablet 0   OVER THE COUNTER MEDICATION Take 3 capsules by mouth daily. Balance of Nature Fruits     OVER THE COUNTER MEDICATION Take 3 capsules by mouth daily. Balance of Nature Vegetables     pantoprazole (PROTONIX) 40 MG tablet Take 1 tablet (40 mg total) by mouth 2 (two) times daily. 60 tablet 0   Polyethyl Glycol-Propyl Glycol (SYSTANE ULTRA) 0.4-0.3 % SOLN Place 1 drop into both eyes daily as needed (dry eyes).     potassium chloride SA (KLOR-CON M) 20 MEQ tablet TAKE TWO TABLETS BY MOUTH IN THE MORNING AND TAKE ONE TABLET IN THE EVENING 90 tablet 6   rosuvastatin (CRESTOR) 40 MG tablet TAKE ONE TABLET BY MOUTH EVERY DAY 90 tablet 3   senna (SENOKOT) 8.6 MG tablet Take 1 tablet by mouth 2 (two) times daily.  vitamin B-12 (CYANOCOBALAMIN) 1000 MCG tablet Take 1 tablet (1,000 mcg total) by mouth daily. 30 tablet 0   No current facility-administered medications on file prior to visit.     Review of Systems  Constitutional:  Negative for appetite change and fever.  Respiratory:  Positive for shortness of breath (improved). Negative for cough and wheezing.   Cardiovascular:  Negative for chest pain, palpitations and leg swelling.  Neurological:  Negative for light-headedness and headaches.       Objective:   Vitals:   07/11/22 1455  BP: 112/62  Pulse: 70  Temp: 98 F (36.7 C)  SpO2: 92%   BP Readings from Last 3 Encounters:  07/11/22 112/62  07/10/22 (!) 155/82  06/26/22 (!) 120/50   Wt Readings from Last 3 Encounters:  07/11/22 196 lb (88.9 kg)  07/10/22  193 lb 12.6 oz (87.9 kg)  06/26/22 193 lb 12.8 oz (87.9 kg)   Body mass index is 25.86 kg/m.    Physical Exam Constitutional:      General: He is not in acute distress.    Appearance: Normal appearance. He is not ill-appearing.  HENT:     Head: Normocephalic and atraumatic.  Eyes:     Conjunctiva/sclera: Conjunctivae normal.  Cardiovascular:     Rate and Rhythm: Normal rate and regular rhythm.     Heart sounds: Normal heart sounds.  Pulmonary:     Effort: Pulmonary effort is normal. No respiratory distress.     Breath sounds: Normal breath sounds. No wheezing or rales.  Musculoskeletal:     Right lower leg: No edema.     Left lower leg: No edema.  Skin:    General: Skin is warm and dry.     Findings: No rash.  Neurological:     Mental Status: He is alert. Mental status is at baseline.  Psychiatric:        Mood and Affect: Mood normal.        Lab Results  Component Value Date   WBC 4.6 06/26/2022   HGB 12.5 (L) 06/26/2022   HCT 39.0 06/26/2022   PLT 249 06/26/2022   GLUCOSE 138 (H) 07/10/2022   CHOL 80 10/24/2021   TRIG 74 10/24/2021   HDL 31 (L) 10/24/2021   LDLDIRECT 41.0 07/11/2021   LDLCALC 34 10/24/2021   ALT 13 04/04/2022   AST 15 04/04/2022   NA 137 07/10/2022   K 4.9 07/10/2022   CL 104 07/10/2022   CREATININE 2.19 (H) 07/10/2022   BUN 25 (H) 07/10/2022   CO2 26 07/10/2022   TSH 1.69 01/10/2022   PSA 1.53 01/15/2008   INR 1.8 (H) 07/01/2021   HGBA1C 6.8 (A) 07/11/2022   HGBA1C 6.8 07/11/2022   HGBA1C 6.8 (A) 07/11/2022   HGBA1C 6.8 07/11/2022   MICROALBUR 1.2 01/10/2022     Assessment & Plan:    See Problem List for Assessment and Plan of chronic medical problems.

## 2022-07-10 NOTE — Telephone Encounter (Signed)
Pt aware, agreeable, and verbalized understanding, repeat lab sch 10/17, instructions also sent via mychart per his request

## 2022-07-10 NOTE — Discharge Instructions (Signed)

## 2022-07-10 NOTE — Anesthesia Postprocedure Evaluation (Signed)
Anesthesia Post Note  Patient: Troy Faulk [name]  Procedure(s) Performed: CARDIOVERSION     Patient location during evaluation: Endoscopy Anesthesia Type: MAC Level of consciousness: awake and alert Pain management: pain level controlled Vital Signs Assessment: post-procedure vital signs reviewed and stable Respiratory status: spontaneous breathing, nonlabored ventilation, respiratory function stable and patient connected to nasal cannula oxygen Cardiovascular status: stable and blood pressure returned to baseline Postop Assessment: no apparent nausea or vomiting Anesthetic complications: no   No notable events documented.  Last Vitals:  Vitals:   07/10/22 1150 07/10/22 1151  BP: (!) 155/82 (!) 155/82  Pulse: 70 69  Resp: 14 15  Temp:    SpO2: 93% 95%    Last Pain:  Vitals:   07/10/22 1151  TempSrc:   PainSc: 0-No pain                 Myia Bergh S

## 2022-07-10 NOTE — Interval H&P Note (Signed)
History and Physical Interval Note:  07/10/2022 10:58 AM  Troy [name] [name]  has presented today for surgery, with the diagnosis of AFIB.  The various methods of treatment have been discussed with the patient and family. After consideration of risks, benefits and other options for treatment, the patient has consented to  Procedure(s): CARDIOVERSION (N/A) TRANSESOPHAGEAL ECHOCARDIOGRAM (TEE) (N/A) as a surgical intervention.  The patient's history has been reviewed, patient examined, no change in status, stable for surgery.  I have reviewed the patient's chart and labs.  Questions were answered to the patient's satisfaction.     Nykeria Mealing [employer]

## 2022-07-11 ENCOUNTER — Ambulatory Visit (INDEPENDENT_AMBULATORY_CARE_PROVIDER_SITE_OTHER): Payer: PPO | Admitting: Internal Medicine

## 2022-07-11 VITALS — BP 112/62 | HR 70 | Temp 98.0°F | Ht 73.0 in | Wt 196.0 lb

## 2022-07-11 DIAGNOSIS — F3289 Other specified depressive episodes: Secondary | ICD-10-CM | POA: Diagnosis not present

## 2022-07-11 DIAGNOSIS — N1831 Chronic kidney disease, stage 3a: Secondary | ICD-10-CM

## 2022-07-11 DIAGNOSIS — I5032 Chronic diastolic (congestive) heart failure: Secondary | ICD-10-CM

## 2022-07-11 DIAGNOSIS — E785 Hyperlipidemia, unspecified: Secondary | ICD-10-CM

## 2022-07-11 DIAGNOSIS — J3089 Other allergic rhinitis: Secondary | ICD-10-CM | POA: Diagnosis not present

## 2022-07-11 DIAGNOSIS — I480 Paroxysmal atrial fibrillation: Secondary | ICD-10-CM

## 2022-07-11 DIAGNOSIS — E1351 Other specified diabetes mellitus with diabetic peripheral angiopathy without gangrene: Secondary | ICD-10-CM

## 2022-07-11 DIAGNOSIS — Z23 Encounter for immunization: Secondary | ICD-10-CM

## 2022-07-11 DIAGNOSIS — I10 Essential (primary) hypertension: Secondary | ICD-10-CM | POA: Diagnosis not present

## 2022-07-11 LAB — POCT GLYCOSYLATED HEMOGLOBIN (HGB A1C)
HbA1c POC (<> result, manual entry): 6.8 % (ref 4.0–5.6)
HbA1c, POC (controlled diabetic range): 6.8 % (ref 0.0–7.0)
HbA1c, POC (prediabetic range): 6.8 % — AB (ref 5.7–6.4)
Hemoglobin A1C: 6.8 % — AB (ref 4.0–5.6)

## 2022-07-11 NOTE — Assessment & Plan Note (Signed)
Chronic BP well controlled Continue amlodipine 5 mg daily, metoprolol xl 12. 5 mg daily cmp

## 2022-07-11 NOTE — Assessment & Plan Note (Signed)
Chronic Controlled, Stable Continue Lexapro 10 mg daily

## 2022-07-11 NOTE — Assessment & Plan Note (Signed)
Chronic Euvolemic Management per cardiology On Lasix 60 mg twice daily, metoprolol XL 12.5 mg daily, Jardiance 10 mg daily

## 2022-07-11 NOTE — Assessment & Plan Note (Signed)
Chronic Continue montelukast 10 mg nightly

## 2022-07-11 NOTE — Assessment & Plan Note (Addendum)
Chronic Following with cardiology Had cardioversion yesterday and is feeling better-shortness of breath and energy level slightly better Eliquis 2.5 mg twice daily, metoprolol XL 12.5 mg daily

## 2022-07-11 NOTE — Assessment & Plan Note (Addendum)
Chronic Jardiance 10 mg daily Medications were just adjusted and he was cardioverted so is no longer in A-fib Hopefully GFR will improve when it is rechecked in a week or so We will hold off on nephrology referral He would prefer not to see any other doctors if its not necessary

## 2022-07-11 NOTE — Assessment & Plan Note (Addendum)
Chronic Check A1c today-6.8% Sugars controlled Continue diet control Encourage diabetic diet, continue regular exercise

## 2022-07-11 NOTE — Assessment & Plan Note (Addendum)
Chronic Regular exercise and healthy diet encouraged Continue Crestor 40 mg daily

## 2022-07-12 ENCOUNTER — Encounter (HOSPITAL_COMMUNITY): Payer: Self-pay | Admitting: Cardiology

## 2022-07-12 NOTE — Addendum Note (Signed)
Addended by: Marcina Millard on: 07/12/2022 04:48 PM   Modules accepted: Orders

## 2022-07-17 ENCOUNTER — Ambulatory Visit (HOSPITAL_COMMUNITY)
Admission: RE | Admit: 2022-07-17 | Discharge: 2022-07-17 | Disposition: A | Discharge status: Home / Self Care | Payer: PPO | Source: Ambulatory Visit | Attending: Cardiology | Admitting: Cardiology

## 2022-07-17 DIAGNOSIS — I480 Paroxysmal atrial fibrillation: Secondary | ICD-10-CM | POA: Diagnosis not present

## 2022-07-17 DIAGNOSIS — I5032 Chronic diastolic (congestive) heart failure: Secondary | ICD-10-CM

## 2022-07-17 LAB — BASIC METABOLIC PANEL
Anion gap: 10 (ref 5–15)
BUN: 22 mg/dL (ref 8–23)
CO2: 26 mmol/L (ref 22–32)
Calcium: 9.7 mg/dL (ref 8.9–10.3)
Chloride: 106 mmol/L (ref 98–111)
Creatinine, Ser: 1.96 mg/dL — ABNORMAL HIGH (ref 0.61–1.24)
GFR, Estimated: 34 mL/min — ABNORMAL LOW (ref >60)
Glucose, Bld: 108 mg/dL — ABNORMAL HIGH (ref 70–99)
Potassium: 4.5 mmol/L (ref 3.5–5.1)
Sodium: 142 mmol/L (ref 135–145)

## 2022-07-18 NOTE — Progress Notes (Signed)
Carelink Summary Report / Loop Recorder 

## 2022-07-20 ENCOUNTER — Ambulatory Visit (HOSPITAL_COMMUNITY)
Admission: RE | Admit: 2022-07-20 | Discharge: 2022-07-20 | Disposition: A | Discharge status: Home / Self Care | Payer: PPO | Source: Ambulatory Visit | Attending: Nurse Practitioner | Admitting: Nurse Practitioner

## 2022-07-20 DIAGNOSIS — J90 Pleural effusion, not elsewhere classified: Secondary | ICD-10-CM | POA: Diagnosis not present

## 2022-07-20 DIAGNOSIS — C3491 Malignant neoplasm of unspecified part of right bronchus or lung: Secondary | ICD-10-CM | POA: Diagnosis not present

## 2022-07-20 DIAGNOSIS — J439 Emphysema, unspecified: Secondary | ICD-10-CM | POA: Diagnosis not present

## 2022-07-20 DIAGNOSIS — C3490 Malignant neoplasm of unspecified part of unspecified bronchus or lung: Secondary | ICD-10-CM | POA: Diagnosis not present

## 2022-07-26 ENCOUNTER — Ambulatory Visit (INDEPENDENT_AMBULATORY_CARE_PROVIDER_SITE_OTHER): Payer: PPO | Admitting: Pulmonary Disease

## 2022-07-26 ENCOUNTER — Encounter: Payer: Self-pay | Admitting: Pulmonary Disease

## 2022-07-26 VITALS — BP 136/76 | HR 84 | Temp 97.9°F | Ht 73.5 in | Wt 191.5 lb

## 2022-07-26 DIAGNOSIS — J449 Chronic obstructive pulmonary disease, unspecified: Secondary | ICD-10-CM

## 2022-07-26 DIAGNOSIS — R911 Solitary pulmonary nodule: Secondary | ICD-10-CM

## 2022-07-26 DIAGNOSIS — C3491 Malignant neoplasm of unspecified part of right bronchus or lung: Secondary | ICD-10-CM | POA: Diagnosis not present

## 2022-07-26 NOTE — Progress Notes (Signed)
Synopsis: Referred in June 2022 for abnormal CT chest, PCP: By Binnie Rail, MD  Subjective:   PATIENT ID: Troy [name] [name] GENDER: male DOB: December 01, 1940, MRN: [id number]  Chief Complaint  Patient presents with   Follow-up    SOB at times.  COPD Does [name] CPAP every night     Is an 81 year old gentleman, past medical history of atrial fibrillation, AAA, BPH, chronic diastolic heart failure, emphysema, history of lung cancer, April 2012, squamous cell carcinoma status post right lower lobectomy at [facility], no chemo no radiation no recurrence, hyperlipidemia, hypertension.  Patient has no respiratory complaints today.  He is anxious about this slow-growing lesion that has been followed for some time.  He has several within the right chest, none within the left chest.  Nuclear medicine pet imaging on 02/08/2021 completed revealing a bandlike hypermetabolic nodules within the inferior aspect of the right upper lobe that have slowly increased over time this nodularity area concerning for malignancy as well as a hypermetabolic nodule just inferior to the SVC within the right upper mediastinum concerning for malignancy as well as a small nodule 13 mm in size with low-level metabolic uptake SUV of 1.4 less concerning for malignancy.  However due to his history of lung cancer status post lobectomy and colon cancer they have been following these areas closely and medical oncology.  OV 05/25/2021: Here today for follow-up regarding lung nodules.  Has no respiratory complaints today.  Had CT scan in June.  Here today for follow-up.  Lung nodule appears stable in comparison to his previous imaging.  OV 07/27/2021: Here today for follow-up after recent hospitalization.  Patient brought into the hospital found to have pleural effusion.  Had thoracentesis completed at that time which was negative for malignant cells did have lymphocyte predominance.  From respiratory standpoint he is doing okay.  Had repeat CT  imaging at the time also showed dissipation of the nodules that we were following in the upper lobes.  He was also discharged from the hospital on multiple inhalers and would like clarification which one he wants to be on.  OV 10/13/2021: Here today for follow-up after recent noncontrasted CT imaging of the chest for lung nodules.  Patient has a right-sided lower lobe area that looks stable.  Reviewed CT results today in the office.  Persistent right-sided effusion appears stable.  OV 07/26/2022: Here today for follow-up after recent CT scan of the chest.  Recent CT completed in October 2023 reviewed images today in the office this shows persistence of the right upper lobe band like retraction and rounded nodular density.  This looks stable to me.  There is been slow progression over time.  Unable to exclude malignancy.  Otherwise patient is doing well.  Still has some issues with eyesight has glaucoma currently managed by ophthalmology.    Past Medical History:  Diagnosis Date   AAA (abdominal aortic aneurysm) ([place]) LAST ABDOMINAL US 10-20-17 3.3 CM   MONITORED BY DR [name]   Anxiety    Arthritis    Atrial fibrillation (HCC)    Basal cell carcinoma    BPH (benign prostatic hypertrophy)    Chronic diastolic heart failure (HCC)    Chronic kidney disease    stage 3, pt unaware   Colon cancer (HCC)    COPD (chronic obstructive pulmonary disease) (HCC)    Depression    GERD (gastroesophageal reflux disease)    Glaucoma BOTH EYES   Dr Gershon Crane   History of  lung cancer APRIL 2012  SQUAMOUS CELL---- S/P RIGHT LOWER LOBECTOMY AT [facility] --  NO CHEMORADIATION---  NO RECURRENCE    ONCOLOGIST- DR Tressie Stalker  LOV IN [facility] 10-27-2012   History of pneumothorax    pt unaware   Hx of adenomatous colonic polyps 2005    X 2; 1 hyperplastic polyp; Dr Olevia Perches   Hyperlipidemia    Hypertension    Impaired fasting glucose 2007   108; A1c5.4%   OSA (obstructive sleep apnea) 08/29/2015   CPAP SET ON 10    Peripheral vascular disease (HCC) S/P ANGIOPLASTY AND STENTING   FOLLOWED  BY DR DICKSON   Spinal stenosis 06/2014   Status post placement of implantable loop recorder    Thoracic aorta atherosclerosis (HCC)    Thyrotoxicosis    amiodarone induced     Family History  Problem Relation Age of Onset   Stroke Mother        mini strokes   Alcohol abuse Father    Heart disease Father        MI after 51   Stroke Father    Hypertension Father    Heart attack Father    Heart disease Paternal Aunt        several   Hypertension Paternal Aunt        several   Stroke Paternal Aunt        several   Stroke Paternal Uncle        several   Heart disease Paternal Uncle        several;2 had MI pre 66   Cancer Daughter 59       breast ca, also with benign sessile polyp    Colon cancer Daughter 36   Colon polyps Neg Hx    Esophageal cancer Neg Hx    Rectal cancer Neg Hx    Stomach cancer Neg Hx      Past Surgical History:  Procedure Laterality Date   ANGIO PLASTY     X 4 in legs   AORTOGRAM  07-27-2002   MILD DIFFUSE ILIAC ARTERY OCCLUSIVE DISEASE /  LEFT RENAL ARTERY 20%/ PATENT LEFT FEM-POP GRAFT/ MILD SFA AND POPLITEAL ARTERY OCCLUSIVE DISEASE W/ SEVERE KIDNEY OCCLUSIVE DISEASE   ATRIAL FIBRILLATION ABLATION N/A 04/09/2019   Procedure: ATRIAL FIBRILLATION ABLATION;  Surgeon: Thompson Grayer, MD;  Location: [place] [facility];  Service: Cardiovascular;  Laterality: N/A;   ATRIAL FIBRILLATION ABLATION N/A 02/12/2020   Procedure: ATRIAL FIBRILLATION ABLATION;  Surgeon: Thompson Grayer, MD;  Location: [place] [facility];  Service: Cardiovascular;  Laterality: N/A;   BALLOON DILATION N/A 02/06/2022   Procedure: BALLOON Tamponade ampulla;  Surgeon: Irving Copas., MD;  Location: [name];  Service: Gastroenterology;  Laterality: N/A;   BASAL CELL CARCINOMA EXCISION     MULTIPLE TIMES--  RIGHT FOREARM, CHEEKS, AND BACK   BIOPSY  06/25/2018   Procedure: BIOPSY;  Surgeon: Rush Landmark  Telford Nab., MD;  Location: [place];  Service: Gastroenterology;;   BIOPSY  02/06/2022   Procedure: BIOPSY;  Surgeon: Irving Copas., MD;  Location: [facility];  Service: Gastroenterology;;   CARDIOVASCULAR STRESS TEST  12-08-2012  DR [facility]   NORMAL LEXISCAN WITH NO EXERCISE NUCLEAR STUDY/ EF 66%/   NO ISCHEMIA/ NO SIGNIFICANT CHANGE FROM PRIOR STUDY   CARDIOVERSION N/A 02/14/2018   Procedure: CARDIOVERSION;  Surgeon: Larey Dresser, MD;  Location: [facility] ENDOSCOPY;  Service: Cardiovascular;  Laterality: N/A;   CARDIOVERSION N/A 07/10/2022   Procedure: CARDIOVERSION;  Surgeon:  Larey Dresser, MD;  Location: [facility] ENDOSCOPY;  Service: Cardiovascular;  Laterality: N/A;   CAROTID ANGIOGRAM N/A 07/10/2013   Procedure: CAROTID ANGIOGRAM;  Surgeon: Elam Dutch, MD;  Location: [facility] CATH LAB;  Service: Cardiovascular;  Laterality: N/A;   CAROTID ENDARTERECTOMY Right 07-14-13   cea   CATARACT EXTRACTION W/ INTRAOCULAR LENS  IMPLANT, BILATERAL     colon polyectomy     COLONOSCOPY     COLONOSCOPY WITH PROPOFOL N/A 06/25/2018   Procedure: COLONOSCOPY WITH PROPOFOL;  Surgeon: Rush Landmark Telford Nab., MD;  Location: [place];  Service: Gastroenterology;  Laterality: N/A;   CYSTOSCOPY W/ RETROGRADES Bilateral 01/21/2013   Procedure: CYSTOSCOPY WITH RETROGRADE PYELOGRAM;  Surgeon: Molli Hazard, MD;  Location: [facility];  Service: Urology;  Laterality: Bilateral;   CYSTO, BLADDER BIOPSY, BILATERAL RETROGRADE PYELOGRAM  RAD TECH FROM RADIOLOGY PER JOY   CYSTOSCOPY WITH BIOPSY N/A 01/21/2013   Procedure: CYSTOSCOPY WITH BIOPSY;  Surgeon: Molli Hazard, MD;  Location: [facility];  Service: Urology;  Laterality: N/A;   ENDARTERECTOMY Right 07/14/2013   Procedure: ENDARTERECTOMY CAROTID;  Surgeon: Angelia Mould, MD;  Location: [facility] OR;  Service: Vascular;  Laterality: Right;   ENDOSCOPIC RETROGRADE CHOLANGIOPANCREATOGRAPHY (ERCP) WITH  PROPOFOL N/A 02/06/2022   Procedure: ENDOSCOPIC RETROGRADE CHOLANGIOPANCREATOGRAPHY (ERCP) WITH PROPOFOL;  Surgeon: Irving Copas., MD;  Location: [name];  Service: Gastroenterology;  Laterality: N/A;   EP IMPLANTABLE DEVICE N/A 08/12/2015   Procedure: Loop Recorder Insertion;  Surgeon: Thompson Grayer, MD;  Location: [place] [facility];  Service: Cardiovascular;  Laterality: N/A;   ESOPHAGOGASTRODUODENOSCOPY Left 02/13/2022   Procedure: ESOPHAGOGASTRODUODENOSCOPY (EGD);  Surgeon: Lavena Bullion, DO;  Location: [facility] ENDOSCOPY;  Service: Gastroenterology;  Laterality: Left;   ESOPHAGOGASTRODUODENOSCOPY (EGD) WITH PROPOFOL N/A 06/25/2018   Procedure: ESOPHAGOGASTRODUODENOSCOPY (EGD) WITH PROPOFOL;  Surgeon: Rush Landmark Telford Nab., MD;  Location: [place];  Service: Gastroenterology;  Laterality: N/A;   ESOPHAGOGASTRODUODENOSCOPY (EGD) WITH PROPOFOL N/A 07/10/2018   Procedure: ESOPHAGOGASTRODUODENOSCOPY (EGD) WITH PROPOFOL;  Surgeon: Milus Banister, MD;  Location[facility];  Service: Endoscopy;  Laterality: N/A;   EUS N/A 07/10/2018   Procedure: UPPER ENDOSCOPIC ULTRASOUND (EUS) RADIAL;  Surgeon: Milus Banister, MD;  Location: [facility];  Service: Endoscopy;  Laterality: N/A;   EYE SURGERY Right    FEMORAL-POPLITEAL BYPASS GRAFT Left [name]   AND 2001  IN [place]   FEMORAL-POPLITEAL BYPASS GRAFT Left 08/30/2015   Procedure: REVISION OF BYPASS GRAFT Left  FEMORAL-POPLITEAL ARTERY;  Surgeon: Angelia Mould, MD;  Location: [place];  Service: Vascular;  Laterality: Left;   FINE NEEDLE ASPIRATION N/A 07/10/2018   Procedure: FINE NEEDLE ASPIRATION (FNA) LINEAR;  Surgeon: Milus Banister, MD;  Location: [facility];  Service: Endoscopy;  Laterality: N/A;   FLEXIBLE SIGMOIDOSCOPY N/A 12/12/2018   Procedure: FLEXIBLE SIGMOIDOSCOPY;  Surgeon: Ileana Roup, MD;  [facility]: [facility];  Service: General;  Laterality: N/A;   HEMOSTASIS CLIP PLACEMENT  02/13/2022    Procedure: HEMOSTASIS CLIP PLACEMENT;  Surgeon: Lavena Bullion, DO;  Location: [place];  Service: Gastroenterology;;   HOT HEMOSTASIS N/A 02/13/2022   Procedure: HOT HEMOSTASIS (ARGON PLASMA COAGULATION/BICAP);  Surgeon: Lavena Bullion, DO;  Location: [facility] ENDOSCOPY;  Service: Gastroenterology;  Laterality: N/A;   LARYNGOSCOPY  06-27-2004   BX VOCAL CORD  (LEUKOPLAKIA)  PER PT NO ISSUES SINCE   LEFT HEART CATH AND CORONARY ANGIOGRAPHY N/A 08/20/2017   Procedure: LEFT HEART CATH AND CORONARY ANGIOGRAPHY;  Surgeon: Larey Dresser, MD;  Location: [place] [facility];  Service: Cardiovascular;  Laterality: N/A;   LOOP RECORDER INSERTION N/A 04/09/2019   Procedure: LOOP RECORDER INSERTION;  Surgeon: Thompson Grayer, MD;  Location: [place] [facility];  Service: Cardiovascular;  Laterality: N/A;   LOOP RECORDER REMOVAL N/A 04/09/2019   Procedure: LOOP RECORDER REMOVAL;  Surgeon: Thompson Grayer, MD;  Location: [place] [facility];  Service: Cardiovascular;  Laterality: N/A;   LOWER EXTREMITY ANGIOGRAM Bilateral 08/29/2015   Procedure: Lower Extremity Angiogram;  Surgeon: Angelia Mould, MD;  Location: [place] [facility];  Service: Cardiovascular;  Laterality: Bilateral;   LUNG LOBECTOMY  01/24/2011    RIGHT UPPER LOBE  (SQUAMOUS CELL CARCINOMA) Dr Dorthea Cove , [facility]. No chemotherapyor radiation   PANCREATIC STENT PLACEMENT  02/06/2022   Procedure: PANCREATIC STENT PLACEMENT;  Surgeon: Rush Landmark Telford Nab., MD;  Location: [name];  Service: Gastroenterology;;   [facility] ANGIOPLASTY Right 07/14/2013   Procedure: PATCH ANGIOPLASTY;  Surgeon: Angelia Mould, MD;  Location: [place];  Service: Vascular;  Laterality: Right;   PATCH ANGIOPLASTY Left 08/30/2015   Procedure: VEIN PATCH ANGIOPLASTY OF PROXIMAL Left BYPASS GRAFT;  Surgeon: Angelia Mould, MD;  Location: [place];  Service: Vascular;  Laterality: Left;   PERIPHERAL VASCULAR CATHETERIZATION N/A 08/29/2015   Procedure: Abdominal  Aortogram;  Surgeon: Angelia Mould, MD;  Location: [place] [facility];  Service: Cardiovascular;  Laterality: N/A;   POLYPECTOMY  06/25/2018   Procedure: POLYPECTOMY;  Surgeon: Rush Landmark Telford Nab., MD;  Location: [place];  Service: Gastroenterology;;   POLYPECTOMY     POLYPECTOMY  02/13/2022   Procedure: POLYPECTOMY;  Surgeon: Lavena Bullion, DO;  Location: [place];  Service: Gastroenterology;;   REMOVAL OF STONES  02/06/2022   Procedure: REMOVAL OF STONES;  Surgeon: Irving Copas., MD;  Location: [name];  Service: Gastroenterology;;   Clide Deutscher  02/13/2022   Procedure: Clide Deutscher;  Surgeon: Lavena Bullion, DO;  Location: [place];  Service: Gastroenterology;;   Joan Mayans  02/06/2022   Procedure: Joan Mayans;  Surgeon: Irving Copas., MD;  Location: [name];  Service: Gastroenterology;;   SUBMUCOSAL INJECTION  06/25/2018   Procedure: SUBMUCOSAL INJECTION;  Surgeon: Irving Copas., MD;  Location: [place];  Service: Gastroenterology;;   TEE WITHOUT CARDIOVERSION N/A 02/14/2018   Procedure: TRANSESOPHAGEAL ECHOCARDIOGRAM (TEE);  Surgeon: Larey Dresser, MD;  Location: [facility] ENDOSCOPY;  Service: Cardiovascular;  Laterality: N/A;   trabecular surgery     OS   TRANSTHORACIC ECHOCARDIOGRAM  12-29-2012  DR [facility]   MILD LVH/  LVSF NORMAL/ EF 09-47%/  GRADE I DIASTOLIC DYSFUNCTION    Social History   Socioeconomic History   Marital status: Married    Spouse name: [name]    Number of children: 3   Years of education: 12+   Highest education level: Not on file  Occupational History   Occupation: Retired    Comment: Owns a [employer], Advertising account executive, 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  Tobacco Use   Smoking status: Former    Packs/day: 0.50    Years: 50.00    Total pack years: 25.00    Types: Cigarettes    Quit date: 07/28/2014    Years since quitting: 8.0    Smokeless tobacco: Never  Vaping Use   Vaping Use: Never used  Substance and Sexual Activity   Alcohol use: Yes    Alcohol/week: 4.0 standard drinks of  alcohol    Types: 1 Glasses of wine, 1 Cans of beer, 1 Shots of liquor, 1 Standard drinks or equivalent per week    Comment: socially, variable; moderately heavy use in the past   Drug use: No   Sexual activity: Not Currently  Other Topics Concern   Not on file  Social History Narrative   Patient lives at home a lone has caregivers   Patient has 3 children    Patient is right handed    Social Determinants of Health   Financial Resource Strain: Low Risk  (09/18/2021)   Overall Financial Resource Strain (CARDIA)    Difficulty of Paying Living Expenses: Not hard at all  Food Insecurity: No Food Insecurity (09/18/2021)   Hunger Vital Sign    Worried About Running Out of Food in the Last Year: Never true    [place] in the Last Year: Never true  Transportation Needs: No Transportation Needs (09/18/2021)   PRAPARE - Hydrologist (Medical): No    Lack of Transportation (Non-Medical): No  Physical Activity: Sufficiently Active (09/18/2021)   Exercise Vital Sign    Days of Exercise per Week: 3 days    Minutes of Exercise per Session: 60 min  Stress: No Stress Concern Present (09/18/2021)   [place]    Feeling of Stress : Not at all  Social Connections: Socially Isolated (09/18/2021)   Social Connection and Isolation Panel [NHANES]    Frequency of Communication with Friends and Family: More than three times a week    Frequency of Social Gatherings with Friends and Family: Once a week    Attends Religious Services: Never    Marine scientist or Organizations: No    Attends Archivist Meetings: Never    Marital Status: Widowed  Intimate Partner Violence: Not At Risk (09/18/2021)   Humiliation, Afraid, Rape, and  Kick questionnaire    Fear of Current or Ex-Partner: No    Emotionally Abused: No    Physically Abused: No    Sexually Abused: No     Allergies  Allergen Reactions   Niacin-Lovastatin Er Shortness Of Breath and Other (See Comments)    Dyspnea, flushing   Penicillins Hives, Shortness Of Breath and Other (See Comments)    Flushing  & dyspnea Because of a history of documented adverse serious drug reaction;Medi Alert bracelet  is recommended PCN reaction causing immediate rash, facial/tongue/throat swelling, SOB or lightheadedness with hypotension: Yes PCN reaction causing severe rash involving mucus membranes or skin necrosis: No PCN reaction occurring within the last 10 years: NO PCN reaction that required hospitalization: NO   Sulfonamide Derivatives Hives, Shortness Of Breath and Other (See Comments)    Flushing & dyspnea Because of a history of documented adverse serious drug reaction;Medi Alert bracelet  is recommended   Atorvastatin Other (See Comments)    Myalgias & athralgias- takes Crestor, DOES NOT LIKE LIPITOR     Outpatient Medications Prior to Visit  Medication Sig Dispense Refill   acetaminophen (TYLENOL) 325 MG tablet Take 650 mg by mouth every 6 (six) hours as needed for moderate pain or headache.     albuterol (VENTOLIN HFA) 108 (90 Base) MCG/ACT inhaler Inhale 2 puffs into the lungs every 6 (six) hours as needed for wheezing or shortness of breath. 8.5 g 0   amLODipine (NORVASC) 5 MG tablet TAKE ONE TABLET BY MOUTH EVERY DAY 90 tablet 3  apixaban (ELIQUIS) 5 MG TABS tablet Take 1/2 tab Twice daily 60 tablet 6   Brinzolamide-Brimonidine (SIMBRINZA) 1-0.2 % SUSP Place 1 drop into both eyes 3 (three) times daily.     Budeson-Glycopyrrol-Formoterol (BREZTRI AEROSPHERE) 160-9-4.8 MCG/ACT AERO Inhale 2 puffs into the lungs in the morning and at bedtime. (Patient taking differently: Inhale 2 puffs into the lungs daily.) 10.7 g 0   docusate sodium (COLACE) 100 MG capsule  Take 100 mg by mouth 2 (two) times daily.      escitalopram (LEXAPRO) 10 MG tablet TAKE ONE TABLET BY MOUTH EVERY DAY 28 tablet 5   furosemide (LASIX) 20 MG tablet Take 3 tablets (60 mg total) by mouth 2 (two) times daily. 450 tablet 3   JARDIANCE 10 MG TABS tablet TAKE ONE TABLET BY MOUTH EVERY DAY BEFORE BREAKFAST. 30 tablet 11   Melatonin 10 MG TABS Take 20 mg by mouth at bedtime.     metoprolol succinate (TOPROL-XL) 25 MG 24 hr tablet TAKE 1/2 TABLET EVERY EVENING WITH OR IMMEDIATELY FOLLOWING A MEAL 45 tablet 3   montelukast (SINGULAIR) 10 MG tablet TAKE ONE TABLET BY MOUTH AT BEDTIME 28 tablet 5   Multiple Vitamins-Minerals (CENTRUM SILVER PO) Take 1 tablet by mouth daily.     omeprazole (PRILOSEC) 20 MG capsule Take 1 capsule (20 mg total) by mouth 2 (two) times daily. 60 capsule 3   ondansetron (ZOFRAN) 4 MG tablet Take 1 tablet (4 mg total) by mouth every 6 (six) hours as needed for nausea. 20 tablet 0   OVER THE COUNTER MEDICATION Take 3 capsules by mouth daily. Balance of Nature Fruits     OVER THE COUNTER MEDICATION Take 3 capsules by mouth daily. Balance of Nature Vegetables     pantoprazole (PROTONIX) 40 MG tablet Take 1 tablet (40 mg total) by mouth 2 (two) times daily. 60 tablet 0   Polyethyl Glycol-Propyl Glycol (SYSTANE ULTRA) 0.4-0.3 % SOLN Place 1 drop into both eyes daily as needed (dry eyes).     potassium chloride SA (KLOR-CON M) 20 MEQ tablet TAKE TWO TABLETS BY MOUTH IN THE MORNING AND TAKE ONE TABLET IN THE EVENING 90 tablet 6   rosuvastatin (CRESTOR) 40 MG tablet TAKE ONE TABLET BY MOUTH EVERY DAY 90 tablet 3   senna (SENOKOT) 8.6 MG tablet Take 1 tablet by mouth 2 (two) times daily.     vitamin B-12 (CYANOCOBALAMIN) 1000 MCG tablet Take 1 tablet (1,000 mcg total) by mouth daily. 30 tablet 0   No facility-administered medications prior to visit.    Review of Systems  Constitutional:  Negative for chills, fever, malaise/fatigue and weight loss.  HENT:  Negative for  hearing loss, sore throat and tinnitus.   Eyes:  Negative for blurred vision and double vision.  Respiratory:  Positive for shortness of breath. Negative for cough, hemoptysis, sputum production, wheezing and stridor.   Cardiovascular:  Negative for chest pain, palpitations, orthopnea, leg swelling and PND.  Gastrointestinal:  Negative for abdominal pain, constipation, diarrhea, heartburn, nausea and vomiting.  Genitourinary:  Negative for dysuria, hematuria and urgency.  Musculoskeletal:  Negative for joint pain and myalgias.  Skin:  Negative for itching and rash.  Neurological:  Negative for dizziness, tingling, weakness and headaches.  Endo/Heme/Allergies:  Negative for environmental allergies. Does not bruise/bleed easily.  Psychiatric/Behavioral:  Negative for depression. The patient is not nervous/anxious and does not have insomnia.   All other systems reviewed and are negative.    Objective:  Physical Exam Vitals  reviewed.  Constitutional:      General: He is not in acute distress.    Appearance: He is well-developed.  HENT:     Head: Normocephalic and atraumatic.  Eyes:     General: No scleral icterus.    Conjunctiva/sclera: Conjunctivae normal.     Pupils: Pupils are equal, round, and reactive to light.  Neck:     Vascular: No JVD.     Trachea: No tracheal deviation.  Cardiovascular:     Rate and Rhythm: Normal rate and regular rhythm.     Heart sounds: Normal heart sounds. No murmur heard. Pulmonary:     Effort: Pulmonary effort is normal. No tachypnea, accessory muscle usage or respiratory distress.     Breath sounds: No stridor. No wheezing, rhonchi or rales.  Abdominal:     General: There is no distension.     Palpations: Abdomen is soft.     Tenderness: There is no abdominal tenderness.  Musculoskeletal:        General: No tenderness.     Cervical back: Neck supple.  Lymphadenopathy:     Cervical: No cervical adenopathy.  Skin:    General: Skin is warm and  dry.     Capillary Refill: Capillary refill takes less than 2 seconds.     Findings: No rash.  Neurological:     Mental Status: He is alert and oriented to person, place, and time.  Psychiatric:        Behavior: Behavior normal.      Vitals:   07/26/22 1422  BP: 136/76  Pulse: 84  Temp: 97.9 F (36.6 C)  TempSrc: Oral  SpO2: 95%  Weight: 191 lb 8 oz (86.9 kg)  Height: 6' 1.5" (1.867 m)   95% on RA BMI Readings from Last 3 Encounters:  07/26/22 24.92 kg/m  07/11/22 25.86 kg/m  07/10/22 25.57 kg/m   Wt Readings from Last 3 Encounters:  07/26/22 191 lb 8 oz (86.9 kg)  07/11/22 196 lb (88.9 kg)  07/10/22 193 lb 12.6 oz (87.9 kg)     CBC    Component Value Date/Time   WBC 4.6 06/26/2022 1528   RBC 4.28 06/26/2022 1528   HGB 12.5 (L) 06/26/2022 1528   HGB 14.3 02/09/2021 1136   HGB 12.4 (L) 05/16/2020 1615   HGB 11.8 (L) 03/07/2016 1213   HCT 39.0 06/26/2022 1528   HCT 37.9 05/16/2020 1615   HCT 36.3 (L) 03/07/2016 1213   PLT 249 06/26/2022 1528   PLT 248 02/09/2021 1136   PLT 329 05/16/2020 1615   MCV 91.1 06/26/2022 1528   MCV 94 05/16/2020 1615   MCV 85.0 03/07/2016 1213   MCH 29.2 06/26/2022 1528   MCHC 32.1 06/26/2022 1528   RDW 16.1 (H) 06/26/2022 1528   RDW 14.2 05/16/2020 1615   RDW 14.7 (H) 03/07/2016 1213   LYMPHSABS 0.7 04/04/2022 1045   LYMPHSABS 0.9 05/16/2020 1615   LYMPHSABS 0.8 (L) 03/07/2016 1213   MONOABS 0.2 04/04/2022 1045   MONOABS 0.8 03/07/2016 1213   EOSABS 0.2 04/04/2022 1045   EOSABS 0.1 05/16/2020 1615   BASOSABS 0.0 04/04/2022 1045   BASOSABS 0.0 05/16/2020 1615   BASOSABS 0.0 03/07/2016 1213    Chest Imaging: 02/08/2021: Nuclear medicine pet imaging Hypermetabolic bandlike nodules within the right upper lobe along the fissure.  A smaller 13 mm nodule anterior right upper lobe.  Also has a right superior mediastinal node just adjacent to the SVC. All have been followed for some time  on CT imaging concern for potential  malignancy. The patient's images have been independently reviewed by me.    03/29/2021: CT scan of the chest: Stable right upper lobe pulmonary nodule. The patient's images have been independently reviewed by me.   CT chest 06/30/2021: Loculated effusion, stable scattered nodular changes Slightly enlarged left paratracheal lymph node. The patient's images have been independently reviewed by me.    Pulmonary Functions Testing Results:    Latest Ref Rng & Units 08/13/2017    3:00 PM 10/04/2016   11:03 AM  PFT Results  FVC-Pre L 3.10  3.14   FVC-Predicted Pre % 67  67   FVC-Post L  3.26   FVC-Predicted Post %  70   Pre FEV1/FVC % % 64  62   Post FEV1/FCV % %  64   FEV1-Pre L 1.98  1.96   FEV1-Predicted Pre % 59  58   FEV1-Post L  2.10   DLCO uncorrected ml/min/mmHg 12.40  14.56   DLCO UNC% % 34  40   DLCO corrected ml/min/mmHg  14.44   DLCO COR %Predicted %  40   DLVA Predicted % 56  59   TLC L 7.39  5.77   TLC % Predicted % 97  76   RV % Predicted % 156  92     FeNO:   Pathology:   Echocardiogram:   Heart Catheterization:     Assessment & Plan:     ICD-10-CM   1. Lung nodule  R91.1 CT Super D Chest Wo Contrast    2. COPD GOLD II   J44.9     3. Squamous cell carcinoma of right lung (HCC)  C34.91       Discussion:  This is a 81 year old gentleman with a history of lung cancer status post right lower lobectomy, history of colon cancer.  Follow-up with medical oncology by Dr. Benay Spice.  For some time we have been following her right upper lobe nodule with low-level PET uptake.  Felt to be inflammatory as it 1 time had some partial resolution however the nodule itself is staying persistent.  Repeat thoracentesis from the right side was negative for malignancy.  Has a left paratracheal node that we have been following.  Plan: I think can have a repeat noncontrast CT chest in 6 months. The nodule itself is much more rounded appearance. I will reach out to Dr.  Benay Spice to let him take a look at the images. If he wants [name] to consider biopsying it we can in the future. I talked about that today with the patient as well.  He is up for having it biopsied if needed. Otherwise we will continue on Breztri for the management of his COPD. Continue albuterol for shortness of breath and wheezing. Follow-up with [name] in clinic in 6 months after his repeat CT chest.    Current Outpatient Medications:    acetaminophen (TYLENOL) 325 MG tablet, Take 650 mg by mouth every 6 (six) hours as needed for moderate pain or headache., Disp: , Rfl:    albuterol (VENTOLIN HFA) 108 (90 Base) MCG/ACT inhaler, Inhale 2 puffs into the lungs every 6 (six) hours as needed for wheezing or shortness of breath., Disp: 8.5 g, Rfl: 0   amLODipine (NORVASC) 5 MG tablet, TAKE ONE TABLET BY MOUTH EVERY DAY, Disp: 90 tablet, Rfl: 3   apixaban (ELIQUIS) 5 MG TABS tablet, Take 1/2 tab Twice daily, Disp: 60 tablet, Rfl: 6   Brinzolamide-Brimonidine (SIMBRINZA) 1-0.2 % SUSP,  Place 1 drop into both eyes 3 (three) times daily., Disp: , Rfl:    Budeson-Glycopyrrol-Formoterol (BREZTRI AEROSPHERE) 160-9-4.8 MCG/ACT AERO, Inhale 2 puffs into the lungs in the morning and at bedtime. (Patient taking differently: Inhale 2 puffs into the lungs daily.), Disp: 10.7 g, Rfl: 0   docusate sodium (COLACE) 100 MG capsule, Take 100 mg by mouth 2 (two) times daily. , Disp: , Rfl:    escitalopram (LEXAPRO) 10 MG tablet, TAKE ONE TABLET BY MOUTH EVERY DAY, Disp: 28 tablet, Rfl: 5   furosemide (LASIX) 20 MG tablet, Take 3 tablets (60 mg total) by mouth 2 (two) times daily., Disp: 450 tablet, Rfl: 3   JARDIANCE 10 MG TABS tablet, TAKE ONE TABLET BY MOUTH EVERY DAY BEFORE BREAKFAST., Disp: 30 tablet, Rfl: 11   Melatonin 10 MG TABS, Take 20 mg by mouth at bedtime., Disp: , Rfl:    metoprolol succinate (TOPROL-XL) 25 MG 24 hr tablet, TAKE 1/2 TABLET EVERY EVENING WITH OR IMMEDIATELY FOLLOWING A MEAL, Disp: 45 tablet, Rfl:  3   montelukast (SINGULAIR) 10 MG tablet, TAKE ONE TABLET BY MOUTH AT BEDTIME, Disp: 28 tablet, Rfl: 5   Multiple Vitamins-Minerals (CENTRUM SILVER PO), Take 1 tablet by mouth daily., Disp: , Rfl:    omeprazole (PRILOSEC) 20 MG capsule, Take 1 capsule (20 mg total) by mouth 2 (two) times daily., Disp: 60 capsule, Rfl: 3   ondansetron (ZOFRAN) 4 MG tablet, Take 1 tablet (4 mg total) by mouth every 6 (six) hours as needed for nausea., Disp: 20 tablet, Rfl: 0   OVER THE COUNTER MEDICATION, Take 3 capsules by mouth daily. Balance of [employer], Disp: , Rfl:    OVER THE COUNTER MEDICATION, Take 3 capsules by mouth daily. Balance of [employer], Disp: , Rfl:    pantoprazole (PROTONIX) 40 MG tablet, Take 1 tablet (40 mg total) by mouth 2 (two) times daily., Disp: 60 tablet, Rfl: 0   Polyethyl Glycol-Propyl Glycol (SYSTANE ULTRA) 0.4-0.3 % SOLN, Place 1 drop into both eyes daily as needed (dry eyes)., Disp: , Rfl:    potassium chloride SA (KLOR-CON M) 20 MEQ tablet, TAKE TWO TABLETS BY MOUTH IN THE MORNING AND TAKE ONE TABLET IN THE EVENING, Disp: 90 tablet, Rfl: 6   rosuvastatin (CRESTOR) 40 MG tablet, TAKE ONE TABLET BY MOUTH EVERY DAY, Disp: 90 tablet, Rfl: 3   senna (SENOKOT) 8.6 MG tablet, Take 1 tablet by mouth 2 (two) times daily., Disp: , Rfl:    vitamin B-12 (CYANOCOBALAMIN) 1000 MCG tablet, Take 1 tablet (1,000 mcg total) by mouth daily., Disp: 30 tablet, Rfl: 0   Garner Nash, DO [facility] 07/26/2022 5:49 PM

## 2022-07-26 NOTE — Patient Instructions (Addendum)
Thank you for visiting Dr. Valeta Harms at [facility]. Today we recommend the following:  Orders Placed This Encounter  Procedures   CT Super D Chest Wo Contrast   Return in about 6 months (around 01/25/2023) for with Eric Form, NP, or Dr. Valeta Harms.    Please do your part to reduce the spread of COVID-19.

## 2022-07-31 ENCOUNTER — Ambulatory Visit (HOSPITAL_COMMUNITY)
Admission: RE | Admit: 2022-07-31 | Discharge: 2022-07-31 | Disposition: A | Discharge status: Home / Self Care | Payer: PPO | Source: Ambulatory Visit | Attending: Family Medicine | Admitting: Family Medicine

## 2022-07-31 ENCOUNTER — Encounter (HOSPITAL_COMMUNITY): Payer: Self-pay

## 2022-07-31 VITALS — BP 162/70 | HR 74 | Wt 194.6 lb

## 2022-07-31 DIAGNOSIS — J439 Emphysema, unspecified: Secondary | ICD-10-CM | POA: Diagnosis not present

## 2022-07-31 DIAGNOSIS — E0590 Thyrotoxicosis, unspecified without thyrotoxic crisis or storm: Secondary | ICD-10-CM

## 2022-07-31 DIAGNOSIS — E785 Hyperlipidemia, unspecified: Secondary | ICD-10-CM | POA: Diagnosis not present

## 2022-07-31 DIAGNOSIS — Z902 Acquired absence of lung [part of]: Secondary | ICD-10-CM | POA: Diagnosis not present

## 2022-07-31 DIAGNOSIS — Z7901 Long term (current) use of anticoagulants: Secondary | ICD-10-CM | POA: Insufficient documentation

## 2022-07-31 DIAGNOSIS — N183 Chronic kidney disease, stage 3 unspecified: Secondary | ICD-10-CM | POA: Diagnosis not present

## 2022-07-31 DIAGNOSIS — Z85118 Personal history of other malignant neoplasm of bronchus and lung: Secondary | ICD-10-CM | POA: Insufficient documentation

## 2022-07-31 DIAGNOSIS — Z7984 Long term (current) use of oral hypoglycemic drugs: Secondary | ICD-10-CM | POA: Diagnosis not present

## 2022-07-31 DIAGNOSIS — I6523 Occlusion and stenosis of bilateral carotid arteries: Secondary | ICD-10-CM | POA: Diagnosis not present

## 2022-07-31 DIAGNOSIS — C187 Malignant neoplasm of sigmoid colon: Secondary | ICD-10-CM | POA: Diagnosis not present

## 2022-07-31 DIAGNOSIS — Z85038 Personal history of other malignant neoplasm of large intestine: Secondary | ICD-10-CM | POA: Diagnosis not present

## 2022-07-31 DIAGNOSIS — C3490 Malignant neoplasm of unspecified part of unspecified bronchus or lung: Secondary | ICD-10-CM

## 2022-07-31 DIAGNOSIS — I4819 Other persistent atrial fibrillation: Secondary | ICD-10-CM | POA: Diagnosis not present

## 2022-07-31 DIAGNOSIS — E782 Mixed hyperlipidemia: Secondary | ICD-10-CM | POA: Diagnosis not present

## 2022-07-31 DIAGNOSIS — Z79899 Other long term (current) drug therapy: Secondary | ICD-10-CM | POA: Insufficient documentation

## 2022-07-31 DIAGNOSIS — G4733 Obstructive sleep apnea (adult) (pediatric): Secondary | ICD-10-CM

## 2022-07-31 DIAGNOSIS — I480 Paroxysmal atrial fibrillation: Secondary | ICD-10-CM | POA: Diagnosis not present

## 2022-07-31 DIAGNOSIS — I5032 Chronic diastolic (congestive) heart failure: Secondary | ICD-10-CM

## 2022-07-31 DIAGNOSIS — Z8709 Personal history of other diseases of the respiratory system: Secondary | ICD-10-CM

## 2022-07-31 DIAGNOSIS — I10 Essential (primary) hypertension: Secondary | ICD-10-CM | POA: Diagnosis not present

## 2022-07-31 DIAGNOSIS — I739 Peripheral vascular disease, unspecified: Secondary | ICD-10-CM | POA: Diagnosis not present

## 2022-07-31 DIAGNOSIS — I714 Abdominal aortic aneurysm, without rupture, unspecified: Secondary | ICD-10-CM | POA: Diagnosis not present

## 2022-07-31 DIAGNOSIS — Z8719 Personal history of other diseases of the digestive system: Secondary | ICD-10-CM

## 2022-07-31 DIAGNOSIS — I2510 Atherosclerotic heart disease of native coronary artery without angina pectoris: Secondary | ICD-10-CM | POA: Diagnosis not present

## 2022-07-31 DIAGNOSIS — I130 Hypertensive heart and chronic kidney disease with heart failure and stage 1 through stage 4 chronic kidney disease, or unspecified chronic kidney disease: Secondary | ICD-10-CM | POA: Insufficient documentation

## 2022-07-31 LAB — BASIC METABOLIC PANEL
Anion gap: 10 (ref 5–15)
BUN: 14 mg/dL (ref 8–23)
CO2: 23 mmol/L (ref 22–32)
Calcium: 9 mg/dL (ref 8.9–10.3)
Chloride: 107 mmol/L (ref 98–111)
Creatinine, Ser: 1.51 mg/dL — ABNORMAL HIGH (ref 0.61–1.24)
GFR, Estimated: 46 mL/min — ABNORMAL LOW (ref >60)
Glucose, Bld: 145 mg/dL — ABNORMAL HIGH (ref 70–99)
Potassium: 3.8 mmol/L (ref 3.5–5.1)
Sodium: 140 mmol/L (ref 135–145)

## 2022-07-31 LAB — BRAIN NATRIURETIC PEPTIDE: B Natriuretic Peptide: 227.9 pg/mL — ABNORMAL HIGH (ref 0.0–100.0)

## 2022-07-31 MED ORDER — METOPROLOL SUCCINATE ER 25 MG PO TB24: 25.0000 mg | ORAL_TABLET | Freq: Every day | ORAL | 3 refills | Status: DC

## 2022-07-31 NOTE — Patient Instructions (Signed)
INCREASE Metoprolol to 25 mg, one tab daul   Labs today We will only contact you if something comes back abnormal or we need to make some changes. Otherwise no news is good news!  You have been referred to [facility] with Dr Quentin Ore -they will be in contact with an appointment  Your physician recommends that you schedule a follow-up appointment in: 3 months with Dr Aundra Dubin. Please call in December to schedule a follow up appointment.  Do the following things EVERYDAY: Weigh yourself in the morning before breakfast. Write it down and keep it in a log. Take your medicines as prescribed Eat low salt foods--Limit salt (sodium) to 2000 mg per day.  Stay as active as you can everyday Limit all fluids for the day to less than 2 liters  At the [facility], you and your health needs are our priority. As part of our continuing mission to provide you with exceptional heart care, we have created designated Provider Care Teams. These Care Teams include your primary Cardiologist (physician) and Advanced Practice Providers (APPs- Physician Assistants and Nurse Practitioners) who all work together to provide you with the care you need, when you need it.   You may see any of the following providers on your designated Care Team at your next follow up: Dr Glori Bickers Dr [name] Dr. Roxana Hires, NP Lyda Jester, [place] [facility], [place] Forestine Na, NP Audry Riles, PharmD   Please be sure to bring in all your medications bottles to every appointment.   If you have any questions or concerns before your next appointment please send [name] a message through [place] or call our office at [phone].    TO LEAVE A MESSAGE FOR THE NURSE SELECT OPTION 2, PLEASE LEAVE A MESSAGE INCLUDING: YOUR NAME DATE OF BIRTH CALL BACK NUMBER REASON FOR CALL**this is important as we prioritize the call backs  YOU WILL RECEIVE A CALL BACK THE SAME DAY  AS LONG AS YOU CALL BEFORE 4:00 PM

## 2022-07-31 NOTE — Progress Notes (Addendum)
Patient ID: Troy Reddy [name], male   DOB: March 23, 1941, 81 y.o.   MRN: [id number] PCP: Dr. Quay Burow Cardiology: [name]  81 y.o. with history of HTN, COPD, active smoking/COPD, carotid stenosis s/p right CEA, paroxysmal atrial fibrillation, and PAD.   He does not have known obstructive CAD but is at high risk for CAD based on his comorbidities.  Lexiscan Cardiolite in 3/14 showed no ischemia or infarction and echo in 3/14 showed normal EF.  He had a left fem-pop bypass at the [facility] in the '90s.  He is followed at [facility] for PAD. He has chronic right calf/thigh/buttocks claudication that is unchanged over the last few years and follows regularly at [facility].  He has had right lower lobectomy for lung cancer.  He continues to stay off cigarettes.    At a prior appointment, he was in atrial fibrillation but did not realize it. Dr Aundra Dubin  started him on Eliquis and diltiazem CD, and he spontaneously converted to NSR.  In 5/15, he was at [facility] and felt "strange" one day: fatigued, weak, short of breath.  He went to the ER and was in atrial fibrillation with HR in 80s-90s.  He spontaneously converted to NSR in the ER.  He felt back to normal after converting to NSR.  Started on Multaq 400 mg bid.  With CHF, he was eventually transitioned over to amiodarone.   He had patch angioplasty revision of left fem-pop bypass in 11/16.  Now with minimal claudication.  He follows with [facility].     He had a Cardiolite and echo in 7/17 that were unremarkable.  Repeat Cardiolite in 12/17 showed no ischemia, echo was uninterpretable.  Cardiac MRI was therefore done in 1/18, showing EF 66% with normal-appearing RV, no late gadolinium enhancement.   Given increased exertional dyspnea and a defect on Cardiolite, he had LHC in 11/18.  This showed no obstructive CAD. He had a chest CT done given concern for possible amiodarone lung toxicity with increased dyspnea. This showed emphysema, no ILD.    In 5/19, he developed a  probable viral syndrome with dehydration and had a presyncopal episode.  He was orthostatic in the hospital.  He was noted to be in atrial fibrillation this admission which remained persistent.  He had a TEE-guided DCCV back to NSR.   In 9/19, he was admitted with GI bleeding and found to have a sigmoid mass on colonoscopy, path showed sigmoid adenocarcinoma.  He was noted to have right paratracheal lymphadenopathy but EUS with biopsy in 10/19 did not show malignancy.  He had surgical resection of colon cancer.   Amiodarone was stopped with elevated ESR and increased dyspnea.  He was also found to have hyperthyroidism. He was treated with methimazole by endocrinology.    He had atrial fibrillation ablation in 7/20.   Echo in 4/21 showed EF 60-65%, normal RV.    In 5/21, due to high atrial fibrillation burden, he had redo atrial fibrillation ablation.   Cardiolite in 6/21 showed EF 70%, no ischemia/infarction.   He was admitted in 10/22 with COVID-19 and COPD exacerbation.  He had a thoracentesis, cytology was negative for cancerous cells.  Echo in 10/22 showed EF 55-60%, moderate LVH, mildly decreased RV systolic function.   He was found to have choledocholithiasis with elevated LFTs, had ERCP with sphincterotomy in 5/23, refused cholecystectomy.  He was also admitted in 5/23 with upper GI bleeding.  He was found to have a duodenal ulcer on EGD.  Follow up 8/23, found to be in atrial fibrillation with slow ventricular rate. Mildly volume overloaded and instructed to increase Lasix to 80 bid x 3 days then Lasix 80/60. S/p DCCV to NSR on 07/10/22. SCr noted to be 2.19, Lasix decreased to 60 bid and losartan stopped. Eliquis dose decreased to 2.5 bid.  Today he returns for HF follow up. Overall feeling fair. He remains SOB with activity. Felt more dyspnea a few days after his cardioversion. Works out with a Clinical research associate 3x/week. Denies palpitations, abnormal bleeding, CP, dizziness, edema, or  PND/Orthopnea. Appetite ok. No fever or chills. Wears CPAP. Drinks a glass of wine once a week. Taking all medications. Has poor eye sight and has a [place] aide help with his medications. He is unaware if he has any abnormal bleeding due to poor eye sight. Asking what we are going to do about his atrial fibrillation.   ECG (personally reviewed): NSR, 66 bpm  Labs (3/13): LDL 75, HDL 35, K 4.1, creatinine 1.1 Labs (3/14): LDL 91, HDL 27 Labs (10/14): K 4.3, creatinine 0.98 Labs (4/15): K 4.9, creatinine 1.1, LDL 76, HDL 30 Labs (5/15): K 4.3, creatinine 1.7, BNP 251 Labs (6/15): K 4.4, creatinine 1.3 Labs (10/15): TSH normal Labs (5/16): K 4.2, creatinine 1.12, HCT 39.5, LFTs normal, LDL 84, HDL 43, TSH normal Labs (11/16): creatinine 0.94 Labs (4/17): LDL 39, HDL 39 Labs (6/17): K 4.5, creatinine 1.2 Labs (9/17): K 4, creatinine 1.25, HCT 38.7 Labs (12/17): K 4.5, creatinine 1.49 => 1.62, BNP 43 Labs (2/18): K 3.8, creatinine 1.33, LFTs normal, TSH normal Labs (6/18): K 4.3, creatinine 1.49, LFTs normal, TSH normal, hgb 12.3 Labs (11/18): ESR 60, TSH normal, LFTs normal, creatinine 1.34 Labs (1/19): LDL 50 Labs (5/19): K 4.6, creatinine 1.55, LFTs normal, hgb 12.7, LFTs normal Labs (10/19): K 4 => 4.1, creatinine 1.39 => 1.43, BNP 279, hgb 10.7, TSH low, free T4 and free T3 low, ESR 108 Labs (3/21): K 4.5, creatinine 1.19, TSH normal Labs (4/21): K 4.8, creatinine 1.28, BNP 524, LDL 59 Labs (7/21): hgb 11.5, creatinine 1.4 Labs (8/21): K 4.3, creatinine 1.09 Labs (9/21): hgb 12.7 Labs (2/22): K 5, creatinine 1.54 Labs (5/22): K 4.2, creatinine 1.38, hgb 14.3, LDL 31 Labs (10/22): LDL 41, TGs 351, hgb 13.4, K 4.3, creatinine 1.57 Labs (1122): K 3.6, creatinine 1.32 Labs (1/23): K 4.0, creatinine 1.31, LDL 34 Labs (4/23): K 4.2, creatinine 1.66, LFTs normal, hgb 13.3, TSH normal Labs (5/23): K 3.2, creatinine 1.49 Labs (7/23): hgb 12 Labs (8/23): K 4.3, creatinine 1.76 Labs (9/23): hgb  12.5 Labs (10/23): K 4.5, creatinine 1.96  PMH: 1. HTN: Fatigue and cough with ramipril use.  2. COPD: Quit smoking 2014. PFTs (1/18) with moderately severe COPD.  - CT chest (11/18): Emphysema noted, no ILD.  3. AAA: CT 1/13 with 3.0 cm AAA.  Abdominal US (1/14) with 3.4 cm AAA. Abdominal US (4/15) with 3.25 x 3.27 AAA. Abdominal US (3/16) with 3.7 cm AAA.  Followed at [facility].  - Abd [name] (1/19): 3.3 cm AAA. 4. Squamous cell lung cancer diagnosed 2/12.  Had right lower lobectomy in 4/12.  5. Hyperlipidemia 6. PAD: Left fem-pop bypass 1994.  ABIs (2/12) 0.62 on right, 0.95 on left. ABIs (1/14): 0.65 on right, 1.04 on left. ABIs (4/15) 0.66 right, 0.87 left.  ABIs (3/16) 0.6 right, 0.88 left.  Patch angioplasty left fem-pop bypass in 11/16.   - Left fem-pop bypass patent on doppler evaluation in 12/17.  - ABIs (  2/21): normal on left (1.02), moderately decreased on right (0.54).  - Peripheral arterial dopplers (3/23): >50% right iliac stenosis, ABIs 0.58 on right and 1.11 on left.  7. CAD: Stress myoview 2004 was normal. Lexiscan Cardiolite (3/14) with EF 66%, no ischemia or infarction. Lexiscan Cardiolite (7/17) with EF 61%, no ischemia/infarction.  - Cardiolite (12/17) with EF 62%, inferior/inferolateral fixed defect, most likely diaphragmatic attenuation, no ischemia.  - Cardiolite (9/18) with EF > 65%, fixed inferior defect (attenuation versus infarction), no ischemia.  - LHC (11/18): No obstructive CAD.  - Cardiolite (6/21): EF 70%, no ischemia/infarction 8. Chronic diastolic CHF: Echo (0/17): technically difficult with EF 55-60%, upper normal RV size.  Echo (5/16) with EF 60-65%.  Echo (7/17) with EF 55-60%, normal RV size and systolic function.  - Echo 12/17 with very poor windows, unable to comment on LV or RV function.  - Cardiac MRI (1/18) with EF 66%, normal RV size and systolic function.  - TEE (5/19): EF 55-60%, normal RV size and systolic function.  - Echo (4/21): EF 60-65%, normal  RV - Echo (10/22): EF 55-60%, moderate LVH, mildly decreased RV systolic function 9. Carotid stenosis: TIA 10/14.  Carotid dopplers with > 79% RICA, 39-03% LICA.  Patient had right CEA in 10/14. Carotids (4/15) patent right CEA, LICA 00-92% stenosis.  Carotids (33/00) < 76% LICA, patent right CEA.  - Carotid dopplers (12/17): right CEA ok, < 22% LICA stenosis.  - Carotid dopplers (1/19): Right CEA ok, 6-33% LICA stenosis.  - Carotid dopplers (2/21): Right CEA ok, LICA 35-45% stenosis.  - Carotid dopplers (3/23): Right CEA ok, LICA 6-25% 10. Atrial fibrillation: Paroxysmal. First noted after lobectomy in 4/12 (brief), recurrence in 4/15 then in 5/15.  - TEE-guided DCCV for persistent atrial fibrillation in 5/19.  - Atrial fibrillation ablation 7/20.  - Re-do atrial fibrillation ablation 5/21.  11. Spinal stenosis 12. OSA: Using CPAP.  13. Glaucoma 14. Has ILR 15. Colon cancer: Sigmoid adenocarcinoma, s/p surgical resection.  16. 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  17. COVID-19 10/22 18. Choledocholithiasis 19. Upper GI bleeding in 5/23 from duodenal ulcer.   SH: Married, lives in [place].  [place].  Quit smoking in 2014.  Rare ETOH now.   FH: No premature CAD  ROS: All systems reviewed and negative except as per HPI.   Current Outpatient Medications  Medication Sig Dispense Refill   acetaminophen (TYLENOL) 325 MG tablet Take 650 mg by mouth every 6 (six) hours as needed for moderate pain or headache.     albuterol (VENTOLIN HFA) 108 (90 Base) MCG/ACT inhaler Inhale 2 puffs into the lungs every 6 (six) hours as needed for wheezing or shortness of breath. 8.5 g 0   amLODipine (NORVASC) 5 MG tablet TAKE ONE TABLET BY MOUTH EVERY DAY 90 tablet 3   apixaban (ELIQUIS) 5 MG TABS tablet Take 1/2 tab Twice daily 60 tablet 6   Brinzolamide-Brimonidine (SIMBRINZA) 1-0.2 % SUSP Place 1 drop into both eyes 3 (three) times daily.      Budeson-Glycopyrrol-Formoterol (BREZTRI AEROSPHERE) 160-9-4.8 MCG/ACT AERO Inhale 2 puffs into the lungs in the morning and at bedtime. (Patient taking differently: Inhale 2 puffs into the lungs daily.) 10.7 g 0   docusate sodium (COLACE) 100 MG capsule Take 100 mg by mouth 2 (two) times daily.      escitalopram (LEXAPRO) 10 MG tablet TAKE ONE TABLET BY MOUTH EVERY DAY 28 tablet 5   furosemide (LASIX) 20 MG tablet Take  3 tablets (60 mg total) by mouth 2 (two) times daily. 450 tablet 3   JARDIANCE 10 MG TABS tablet TAKE ONE TABLET BY MOUTH EVERY DAY BEFORE BREAKFAST. 30 tablet 11   Melatonin 10 MG TABS Take 20 mg by mouth at bedtime.     metoprolol succinate (TOPROL-XL) 25 MG 24 hr tablet Take 1 tablet (25 mg total) by mouth daily. 90 tablet 3   montelukast (SINGULAIR) 10 MG tablet TAKE ONE TABLET BY MOUTH AT BEDTIME 28 tablet 5   Multiple Vitamins-Minerals (CENTRUM SILVER PO) Take 1 tablet by mouth daily.     omeprazole (PRILOSEC) 20 MG capsule Take 1 capsule (20 mg total) by mouth 2 (two) times daily. 60 capsule 3   ondansetron (ZOFRAN) 4 MG tablet Take 1 tablet (4 mg total) by mouth every 6 (six) hours as needed for nausea. 20 tablet 0   OVER THE COUNTER MEDICATION Take 3 capsules by mouth daily. Balance of [employer]     Polyethyl Glycol-Propyl Glycol (SYSTANE ULTRA) 0.4-0.3 % SOLN Place 1 drop into both eyes daily as needed (dry eyes).     potassium chloride SA (KLOR-CON M) 20 MEQ tablet TAKE TWO TABLETS BY MOUTH IN THE MORNING AND TAKE ONE TABLET IN THE EVENING 90 tablet 6   rosuvastatin (CRESTOR) 40 MG tablet TAKE ONE TABLET BY MOUTH EVERY DAY 90 tablet 3   senna (SENOKOT) 8.6 MG tablet Take 1 tablet by mouth 2 (two) times daily.     vitamin B-12 (CYANOCOBALAMIN) 1000 MCG tablet Take 1 tablet (1,000 mcg total) by mouth daily. 30 tablet 0   No current facility-administered medications for this encounter.   Wt Readings from Last 3 Encounters:  07/31/22 88.3 kg (194 lb 9.6 oz)  07/26/22  86.9 kg (191 lb 8 oz)  07/11/22 88.9 kg (196 lb)   BP (!) 162/70   Pulse 74   Wt 88.3 kg (194 lb 9.6 oz)   SpO2 90%   BMI 25.33 kg/m    Physical Exam General:  NAD. No resp difficulty, walked into clinic HEENT: Normal, poor eye sight Neck: Supple. No JVD. Carotids 2+ bilat; no bruits. No lymphadenopathy or thryomegaly appreciated. Cor: PMI nondisplaced. Regular rate & rhythm. No rubs, gallops or murmurs. Lungs: Clear, absent BS RLL Abdomen: Soft, nontender, nondistended. No hepatosplenomegaly. No bruits or masses. Good bowel sounds. Extremities: No cyanosis, clubbing, rash, edema Neuro: Alert & oriented x 3, cranial nerves grossly intact. Moves all 4 extremities w/o difficulty. Affect pleasant. Shuffling gait. Appears to have mild memory impairment.  Assessment/Plan: 1. Chronic diastolic CHF: Echo in 79/02 with EF 55-60%, mild LVH, mildly dysfunctional RV.  Chronic NYHA class III symptoms, he is not volume overloaded today. - Increase Toprol XL to 25 mg daily. - Continue Lasix 60 mg bid. BMET and BNP today. - Continue KCl 40 qam/20 qpm.  - Continue Jardiance 10 mg daily. No GU symptoms. - Off losartan with increased SCr. Consider re-challenge when renal function stabilized. 2. Hyperlipidemia: 1/23 lipids with good LDL. - Continue Crestor 40 mg daily.  - Continue Vascepa 2 g bid. 3. Carotid stenosis: s/p R CEA.  Dopplers followed at [facility].  4. PAD: s/p patch angioplasty to left fem-pop bypass in 11/16.  Followed at [facility].  Peripheral arterial dopplers in 3/23 with >50% right iliac stenosis, but no rest pain or pedal ulcerations.  Rare mild claudication.  - Follows with [facility].  5. AAA: Stable on last [name], followed at [facility].   6. COPD: No  longer smoking. PFTs in 1/18 showed moderately severe obstructive defect. CT chest in 11/18 showed emphysema.  COPD contributes to his dyspnea.  Oxygen saturation 90% when walking in today.  - Recommended that he get a pulse oximeter and check oxygen  saturation at home.  He may benefit from home oxygen, at least with exertion.  7. Atrial fibrillation: S/p atrial fibrillation ablation in 7/20 and redo in 5/21.  He has been primarily in NSR since redo ablation.  Back in slow atrial fibrillation 9/23 and s/p DCCV 10/23 to NSR. In NSR on ECG today. - Increase Toprol as above. - Continue Eliquis 2.5 mg bid. (Reduce dose with creatinine and age). - Continue to limit ETOH.  - Continue to use CPAP.  - He feels poorly in atrial fibrillation, but rate control likely best strategy. 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 Refer back to EP to discuss options (? Re-do ablation). I believe he is going to be assigned to Dr. Quentin Ore. 8. CKD: Stage 3.  BMET today.  9. CAD: LHC in 11/18 with nonobstructive disease only.  Cardiolite in 6/21 with no ischemia/infarction. No chest pain.  10. 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  He is no longer on methimazole, follows with endocrinology.     11. Colon cancer: Sigmoid adenocarcinoma, s/p surgical resection. In remission.  12. HTN: Elevated off losartan. - Increase Toprol as above.  13. Pleural effusion: Chronic right pleural effusion, s/p right lower lobectomy.   14. Squamous cell lung cancer: S/p right lower lobectomy.  Follows with Dr. Valeta Harms.  15. Upper GI bleeding: In 5/23 due to duodenal ulcer. No abnormal bleeding. - Continue PPI.  - Recent CBC stable  Follow up in 3 months with Dr. Wynema Birch [facility] FNP-BC 07/31/2022

## 2022-08-01 ENCOUNTER — Telehealth (HOSPITAL_COMMUNITY): Payer: Self-pay

## 2022-08-01 NOTE — Telephone Encounter (Signed)
Patient aware and agreeable. 

## 2022-08-08 ENCOUNTER — Encounter: Payer: Self-pay | Admitting: Student

## 2022-08-08 ENCOUNTER — Ambulatory Visit: Payer: PPO | Attending: Student | Admitting: Student

## 2022-08-08 VITALS — BP 138/88 | HR 72 | Ht 73.5 in | Wt 189.0 lb

## 2022-08-08 DIAGNOSIS — I480 Paroxysmal atrial fibrillation: Secondary | ICD-10-CM | POA: Diagnosis not present

## 2022-08-08 DIAGNOSIS — J439 Emphysema, unspecified: Secondary | ICD-10-CM

## 2022-08-08 DIAGNOSIS — I5032 Chronic diastolic (congestive) heart failure: Secondary | ICD-10-CM | POA: Diagnosis not present

## 2022-08-08 NOTE — Patient Instructions (Signed)
Medication Instructions:  Your physician recommends that you continue on your current medications as directed. Please refer to the Current Medication list given to you today.  *If you need a refill on your cardiac medications before your next appointment, please call your pharmacy*   Lab Work: None If you have labs (blood work) drawn today and your tests are completely normal, you will receive your results only by: [place] (if you have MyChart) OR A paper copy in the mail If you have any lab test that is abnormal or we need to change your treatment, we will call you to review the results.   Follow-Up: At [facility], you and your health needs are our priority.  As part of our continuing mission to provide you with exceptional heart care, we have created designated Provider Care Teams.  These Care Teams include your primary Cardiologist (physician) and Advanced Practice Providers (APPs -  Physician Assistants and Nurse Practitioners) who all work together to provide you with the care you need, when you need it.   Your next appointment:   6 month(s)  The format for your next appointment:   In Person  Provider:   Doralee Albino, MD   Important Information About Sugar

## 2022-08-08 NOTE — Addendum Note (Signed)
Encounter addended by: Rafael Bihari, FNP on: 08/08/2022 11:18 AM  Actions taken: Clinical Note Signed

## 2022-08-08 NOTE — Progress Notes (Signed)
Electrophysiology Office Note Date: 08/08/2022  ID:  Troy Kindred [name], DOB [birth year], MRN [id number]  PCP: Binnie Rail, MD Primary Cardiologist: None Electrophysiologist: [name] -> Dr. Myles Gip  CC: ILR follow-up  Troy Kindred [name] is a 81 y.o. male seen today for Dr. Myles Gip . he presents today for acute visit due to recurrent AF, DOE, and fatigue.    Patient reports feeling worse overall since his cardioversion 07/10/2022. He was under the impression that he had gone back into AF at his appt 07/31/2022, and was here today to discuss treatment options moving forward.  He is SOB with more than minimal exertion, and walks very slowly. This seems to have worsened recently.  He was seen by pulmonary recently, recommended repeat non-contrast CT chest in 6 months. He has severe COPD and h/o R lobectomy.   Device History: MDT loop implanted 08/12/2015 extracted 04/09/2019 MDT LNQ II implanted 04/09/2019   AFib hx Diagnosed +/- 2015 Amiodarone, CT chest 2018 neg for ILD, stopped 2019 with continued SOB and elevated ESR, hyperthyroid PVI ablation 04/09/2019, PVI/CTI ablation EPS/ablation Feb 12, 2020, veins were quiet, LA standard box lesion created  Past Medical History:  Diagnosis Date   AAA (abdominal aortic aneurysm) ([place]) LAST ABDOMINAL US 10-20-17 3.3 CM   MONITORED BY DR [name]   Anxiety    Arthritis    Atrial fibrillation (HCC)    Basal cell carcinoma    BPH (benign prostatic hypertrophy)    Chronic diastolic heart failure (HCC)    Chronic kidney disease    stage 3, pt unaware   Colon cancer ([place])    COPD (chronic obstructive pulmonary disease) ([place])    Depression    GERD (gastroesophageal reflux disease)    Glaucoma BOTH EYES   Dr Gershon Crane   History of lung cancer APRIL 2012  SQUAMOUS CELL---- S/P RIGHT LOWER LOBECTOMY AT [facility] --  NO CHEMORADIATION---  NO RECURRENCE    ONCOLOGIST- DR Tressie Stalker  LOV IN [facility] 10-27-2012   History of pneumothorax    pt  unaware   Hx of adenomatous colonic polyps 2005    X 2; 1 hyperplastic polyp; Dr Olevia Perches   Hyperlipidemia    Hypertension    Impaired fasting glucose 2007   108; A1c5.4%   OSA (obstructive sleep apnea) 08/29/2015   CPAP SET ON 10   Peripheral vascular disease (HCC) S/P ANGIOPLASTY AND STENTING   FOLLOWED  BY DR DICKSON   Spinal stenosis 06/2014   Status post placement of implantable loop recorder    Thoracic aorta atherosclerosis (HCC)    Thyrotoxicosis    amiodarone induced   Past Surgical History:  Procedure Laterality Date   ANGIO PLASTY     X 4 in legs   AORTOGRAM  07-27-2002   MILD DIFFUSE ILIAC ARTERY OCCLUSIVE DISEASE /  LEFT RENAL ARTERY 20%/ PATENT LEFT FEM-POP GRAFT/ MILD SFA AND POPLITEAL ARTERY OCCLUSIVE DISEASE W/ SEVERE KIDNEY OCCLUSIVE DISEASE   ATRIAL FIBRILLATION ABLATION N/A 04/09/2019   Procedure: ATRIAL FIBRILLATION ABLATION;  Surgeon: Thompson Grayer, MD;  Location: [place] [facility];  Service: Cardiovascular;  Laterality: N/A;   ATRIAL FIBRILLATION ABLATION N/A 02/12/2020   Procedure: ATRIAL FIBRILLATION ABLATION;  Surgeon: Thompson Grayer, MD;  Location: [place] [facility];  Service: Cardiovascular;  Laterality: N/A;   BALLOON DILATION N/A 02/06/2022   Procedure: BALLOON Tamponade ampulla;  Surgeon: Irving Copas., MD;  Location[name];  Service: Gastroenterology;  Laterality: N/A;   BASAL CELL CARCINOMA  EXCISION     MULTIPLE TIMES--  RIGHT FOREARM, CHEEKS, AND BACK   BIOPSY  06/25/2018   Procedure: BIOPSY;  Surgeon: Rush Landmark Telford Nab., MD;  Location: [name];  Service: Gastroenterology;;   BIOPSY  02/06/2022   Procedure: BIOPSY;  Surgeon: Irving Copas., MD;  [facility]: [facility];  Service: Gastroenterology;;   CARDIOVASCULAR STRESS TEST  12-08-2012  DR Elsie WITH NO EXERCISE NUCLEAR STUDY/ EF 66%/   NO ISCHEMIA/ NO SIGNIFICANT CHANGE FROM PRIOR STUDY   CARDIOVERSION N/A 02/14/2018   Procedure: CARDIOVERSION;   Surgeon: Larey Dresser, MD;  Location: [facility] ENDOSCOPY;  Service: Cardiovascular;  Laterality: N/A;   CARDIOVERSION N/A 07/10/2022   Procedure: CARDIOVERSION;  Surgeon: Larey Dresser, MD;  Location: [place];  Service: Cardiovascular;  Laterality: N/A;   CAROTID ANGIOGRAM N/A 07/10/2013   Procedure: CAROTID ANGIOGRAM;  Surgeon: Elam Dutch, MD;  Location: [facility] CATH LAB;  Service: Cardiovascular;  Laterality: N/A;   CAROTID ENDARTERECTOMY Right 07-14-13   cea   CATARACT EXTRACTION W/ INTRAOCULAR LENS  IMPLANT, BILATERAL     colon polyectomy     COLONOSCOPY     COLONOSCOPY WITH PROPOFOL N/A 06/25/2018   Procedure: COLONOSCOPY WITH PROPOFOL;  Surgeon: Rush Landmark Telford Nab., MD;  Location: [place];  Service: Gastroenterology;  Laterality: N/A;   CYSTOSCOPY W/ RETROGRADES Bilateral 01/21/2013   Procedure: CYSTOSCOPY WITH RETROGRADE PYELOGRAM;  Surgeon: Molli Hazard, MD;  Location: [facility];  Service: Urology;  Laterality: Bilateral;   CYSTO, BLADDER BIOPSY, BILATERAL RETROGRADE PYELOGRAM  RAD TECH FROM RADIOLOGY PER JOY   CYSTOSCOPY WITH BIOPSY N/A 01/21/2013   Procedure: CYSTOSCOPY WITH BIOPSY;  Surgeon: Molli Hazard, MD;  Location: [facility];  Service: Urology;  Laterality: N/A;   ENDARTERECTOMY Right 07/14/2013   Procedure: ENDARTERECTOMY CAROTID;  Surgeon: Angelia Mould, MD;  Location: [facility] OR;  Service: Vascular;  Laterality: Right;   ENDOSCOPIC RETROGRADE CHOLANGIOPANCREATOGRAPHY (ERCP) WITH PROPOFOL N/A 02/06/2022   Procedure: ENDOSCOPIC RETROGRADE CHOLANGIOPANCREATOGRAPHY (ERCP) WITH PROPOFOL;  Surgeon: Irving Copas., MD;  Location: [name];  Service: Gastroenterology;  Laterality: N/A;   EP IMPLANTABLE DEVICE N/A 08/12/2015   Procedure: Loop Recorder Insertion;  Surgeon: Thompson Grayer, MD;  Location: [place] [facility];  Service: Cardiovascular;  Laterality: N/A;   ESOPHAGOGASTRODUODENOSCOPY Left  02/13/2022   Procedure: ESOPHAGOGASTRODUODENOSCOPY (EGD);  Surgeon: Lavena Bullion, DO;  Location: [facility] ENDOSCOPY;  Service: Gastroenterology;  Laterality: Left;   ESOPHAGOGASTRODUODENOSCOPY (EGD) WITH PROPOFOL N/A 06/25/2018   Procedure: ESOPHAGOGASTRODUODENOSCOPY (EGD) WITH PROPOFOL;  Surgeon: Rush Landmark Telford Nab., MD;  Location: [place];  Service: Gastroenterology;  Laterality: N/A;   ESOPHAGOGASTRODUODENOSCOPY (EGD) WITH PROPOFOL N/A 07/10/2018   Procedure: ESOPHAGOGASTRODUODENOSCOPY (EGD) WITH PROPOFOL;  Surgeon: Milus Banister, MD;  [facility];  Service: Endoscopy;  Laterality: N/A;   EUS N/A 07/10/2018   Procedure: UPPER ENDOSCOPIC ULTRASOUND (EUS) RADIAL;  Surgeon: Milus Banister, MD;  [facility];  Service: Endoscopy;  Laterality: N/A;   EYE SURGERY Right    FEMORAL-POPLITEAL BYPASS GRAFT Left [place]   AND 2001  IN [place]   FEMORAL-POPLITEAL BYPASS GRAFT Left 08/30/2015   Procedure: REVISION OF BYPASS GRAFT Left  FEMORAL-POPLITEAL ARTERY;  Surgeon: Angelia Mould, MD;  Location: [place];  Service: Vascular;  Laterality: Left;   FINE NEEDLE ASPIRATION N/A 07/10/2018   Procedure: FINE NEEDLE ASPIRATION (FNA) LINEAR;  Surgeon: Milus Banister, MD;  [facility];  Service: Endoscopy;  Laterality: N/A;   FLEXIBLE SIGMOIDOSCOPY N/A 12/12/2018   Procedure: FLEXIBLE SIGMOIDOSCOPY;  Surgeon: Ileana Roup, MD;  [facility];  Service: General;  Laterality: N/A;   HEMOSTASIS CLIP PLACEMENT  02/13/2022   Procedure: HEMOSTASIS CLIP PLACEMENT;  Surgeon: Lavena Bullion, DO;  Location: [place];  Service: Gastroenterology;;   HOT HEMOSTASIS N/A 02/13/2022   Procedure: HOT HEMOSTASIS (ARGON PLASMA COAGULATION/BICAP);  Surgeon: Lavena Bullion, DO;  Location: [facility] ENDOSCOPY;  Service: Gastroenterology;  Laterality: N/A;   LARYNGOSCOPY  06-27-2004   BX VOCAL CORD  (LEUKOPLAKIA)  PER PT NO ISSUES SINCE   LEFT HEART CATH AND  CORONARY ANGIOGRAPHY N/A 08/20/2017   Procedure: LEFT HEART CATH AND CORONARY ANGIOGRAPHY;  Surgeon: Larey Dresser, MD;  Location: [place] [facility] LAB;  Service: Cardiovascular;  Laterality: N/A;   LOOP RECORDER INSERTION N/A 04/09/2019   Procedure: LOOP RECORDER INSERTION;  Surgeon: Thompson Grayer, MD;  Location: [place] [facility];  Service: Cardiovascular;  Laterality: N/A;   LOOP RECORDER REMOVAL N/A 04/09/2019   Procedure: LOOP RECORDER REMOVAL;  Surgeon: Thompson Grayer, MD;  Location: [place] [facility];  Service: Cardiovascular;  Laterality: N/A;   LOWER EXTREMITY ANGIOGRAM Bilateral 08/29/2015   Procedure: Lower Extremity Angiogram;  Surgeon: Angelia Mould, MD;  Location: [place] [facility];  Service: Cardiovascular;  Laterality: Bilateral;   LUNG LOBECTOMY  01/24/2011    RIGHT UPPER LOBE  (SQUAMOUS CELL CARCINOMA) Dr Dorthea Cove , [facility]. No chemotherapyor radiation   PANCREATIC STENT PLACEMENT  02/06/2022   Procedure: PANCREATIC STENT PLACEMENT;  Surgeon: Rush Landmark Telford Nab., MD;  Location: [name];  Service: Gastroenterology;;   [facility] ANGIOPLASTY Right 07/14/2013   Procedure: PATCH ANGIOPLASTY;  Surgeon: Angelia Mould, MD;  Location: [name];  Service: Vascular;  Laterality: Right;   PATCH ANGIOPLASTY Left 08/30/2015   Procedure: VEIN PATCH ANGIOPLASTY OF PROXIMAL Left BYPASS GRAFT;  Surgeon: Angelia Mould, MD;  Location: [place];  Service: Vascular;  Laterality: Left;   PERIPHERAL VASCULAR CATHETERIZATION N/A 08/29/2015   Procedure: Abdominal Aortogram;  Surgeon: Angelia Mould, MD;  Location: [place] [facility] LAB;  Service: Cardiovascular;  Laterality: N/A;   POLYPECTOMY  06/25/2018   Procedure: POLYPECTOMY;  Surgeon: Rush Landmark Telford Nab., MD;  Location: [place];  Service: Gastroenterology;;   POLYPECTOMY     POLYPECTOMY  02/13/2022   Procedure: POLYPECTOMY;  Surgeon: Lavena Bullion, DO;  Location: [place];  Service: Gastroenterology;;   REMOVAL  OF STONES  02/06/2022   Procedure: REMOVAL OF STONES;  Surgeon: Irving Copas., MD;  Location[name];  Service: Gastroenterology;;   Clide Deutscher  02/13/2022   Procedure: Clide Deutscher;  Surgeon: Lavena Bullion, DO;  Location: [place];  Service: Gastroenterology;;   Joan Mayans  02/06/2022   Procedure: Joan Mayans;  Surgeon: Irving Copas., MD;  Location: [name];  Service: Gastroenterology;;   SUBMUCOSAL INJECTION  06/25/2018   Procedure: SUBMUCOSAL INJECTION;  Surgeon: Irving Copas., MD;  Location: [place];  Service: Gastroenterology;;   TEE WITHOUT CARDIOVERSION N/A 02/14/2018   Procedure: TRANSESOPHAGEAL ECHOCARDIOGRAM (TEE);  Surgeon: Larey Dresser, MD;  Location: [facility] ENDOSCOPY;  Service: Cardiovascular;  Laterality: N/A;   trabecular surgery     OS   TRANSTHORACIC ECHOCARDIOGRAM  12-29-2012  DR [facility]   MILD LVH/  LVSF NORMAL/ EF 91-47%/  GRADE I DIASTOLIC DYSFUNCTION    Current Outpatient Medications  Medication Sig Dispense Refill   acetaminophen (TYLENOL) 325 MG tablet Take 650 mg by mouth every  6 (six) hours as needed for moderate pain or headache.     albuterol (VENTOLIN HFA) 108 (90 Base) MCG/ACT inhaler Inhale 2 puffs into the lungs every 6 (six) hours as needed for wheezing or shortness of breath. 8.5 g 0   amLODipine (NORVASC) 5 MG tablet TAKE ONE TABLET BY MOUTH EVERY DAY 90 tablet 3   apixaban (ELIQUIS) 5 MG TABS tablet Take 1/2 tab Twice daily 60 tablet 6   Brinzolamide-Brimonidine (SIMBRINZA) 1-0.2 % SUSP Place 1 drop into both eyes 3 (three) times daily.     Budeson-Glycopyrrol-Formoterol (BREZTRI AEROSPHERE) 160-9-4.8 MCG/ACT AERO Inhale 2 puffs into the lungs in the morning and at bedtime. (Patient taking differently: Inhale 2 puffs into the lungs daily.) 10.7 g 0   docusate sodium (COLACE) 100 MG capsule Take 100 mg by mouth 2 (two) times daily.      escitalopram (LEXAPRO) 10 MG tablet TAKE ONE TABLET BY MOUTH  EVERY DAY 28 tablet 5   furosemide (LASIX) 20 MG tablet Take 3 tablets (60 mg total) by mouth 2 (two) times daily. 450 tablet 3   JARDIANCE 10 MG TABS tablet TAKE ONE TABLET BY MOUTH EVERY DAY BEFORE BREAKFAST. 30 tablet 11   Melatonin 10 MG TABS Take 20 mg by mouth at bedtime.     metoprolol succinate (TOPROL-XL) 25 MG 24 hr tablet Take 1 tablet (25 mg total) by mouth daily. 90 tablet 3   montelukast (SINGULAIR) 10 MG tablet TAKE ONE TABLET BY MOUTH AT BEDTIME 28 tablet 5   Multiple Vitamins-Minerals (CENTRUM SILVER PO) Take 1 tablet by mouth daily.     omeprazole (PRILOSEC) 20 MG capsule Take 1 capsule (20 mg total) by mouth 2 (two) times daily. 60 capsule 3   ondansetron (ZOFRAN) 4 MG tablet Take 1 tablet (4 mg total) by mouth every 6 (six) hours as needed for nausea. 20 tablet 0   OVER THE COUNTER MEDICATION Take 3 capsules by mouth daily. Balance of Nature Fruits     Polyethyl Glycol-Propyl Glycol (SYSTANE ULTRA) 0.4-0.3 % SOLN Place 1 drop into both eyes daily as needed (dry eyes).     potassium chloride SA (KLOR-CON M) 20 MEQ tablet TAKE TWO TABLETS BY MOUTH IN THE MORNING AND TAKE ONE TABLET IN THE EVENING 90 tablet 6   rosuvastatin (CRESTOR) 40 MG tablet TAKE ONE TABLET BY MOUTH EVERY DAY 90 tablet 3   senna (SENOKOT) 8.6 MG tablet Take 1 tablet by mouth 2 (two) times daily.     vitamin B-12 (CYANOCOBALAMIN) 1000 MCG tablet Take 1 tablet (1,000 mcg total) by mouth daily. 30 tablet 0   No current facility-administered medications for this visit.    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  Tobacco Use   Smoking status: Former    Packs/day: 0.50    Years: 50.00    Total pack years: 25.00    Types: Cigarettes    Quit date: 07/28/2014    Years since quitting: 8.0   Smokeless tobacco: Never  Vaping Use   Vaping Use: Never  used  Substance and Sexual Activity   Alcohol use: Yes    Alcohol/week: 4.0 standard drinks of alcohol    Types: 1 Glasses of wine, 1 Cans of beer, 1 Shots of liquor, 1 Standard drinks or equivalent per week    Comment: socially, variable; moderately heavy use in the past   Drug use: No   Sexual activity: Not Currently  Other Topics Concern   Not on file  Social History Narrative   Patient lives at home a lone has caregivers   Patient has 3 children    Patient is right handed    Social Determinants of Health   Financial Resource Strain: Low Risk  (09/18/2021)   Overall Financial Resource Strain (CARDIA)    Difficulty of Paying Living Expenses: Not hard at all  Food Insecurity: No Food Insecurity (09/18/2021)   Hunger Vital Sign    Worried About Running Out of Food in the Last Year: Never true    Ran Out of Food in the Last Year: Never true  Transportation Needs: No Transportation Needs (09/18/2021)   PRAPARE - Hydrologist (Medical): No    Lack of Transportation (Non-Medical): No  Physical Activity: Sufficiently Active (09/18/2021)   Exercise Vital Sign    Days of Exercise per Week: 3 days    Minutes of Exercise per Session: 60 min  Stress: No Stress Concern Present (09/18/2021)    [place]    Feeling of Stress : Not at all  Social Connections: Socially Isolated (09/18/2021)   Social Connection and Isolation Panel [NHANES]    Frequency of Communication with Friends and Family: More than three times a week    Frequency of Social Gatherings with Friends and Family: Once a week    Attends Religious Services: Never     Marine scientist or Organizations: No    Attends Archivist Meetings: Never    Marital Status: Widowed  Intimate Partner Violence: Not At Risk (09/18/2021)   Humiliation, Afraid, Rape, and Kick questionnaire    Fear of Current or Ex-Partner: No    Emotionally Abused: No    Physically Abused: No    Sexually Abused: No    Family History: Family History  Problem Relation Age of Onset   Stroke Mother        mini strokes   Alcohol abuse Father    Heart disease Father        MI after 54   Stroke Father    Hypertension Father    Heart attack Father    Heart disease Paternal Aunt        several   Hypertension Paternal Aunt        several   Stroke Paternal Aunt        several   Stroke Paternal Uncle        several   Heart disease Paternal Uncle        several;2 had MI pre 64   Cancer Daughter 38       breast ca, also with benign sessile polyp    Colon cancer Daughter 47   Colon polyps Neg Hx    Esophageal cancer Neg Hx    Rectal cancer Neg Hx    Stomach cancer Neg Hx      Review of Systems: All other systems reviewed and are otherwise negative except as noted above.  Physical Exam: Vitals:   08/08/22 1033  BP:  138/88  Pulse: 72  SpO2: 90%  Weight: 189 lb (85.7 kg)  Height: 6' 1.5" (1.867 m)     GEN- The patient is well appearing, alert and oriented x 3 today.   HEENT: normocephalic, atraumatic; sclera clear, conjunctiva pink; hearing intact; oropharynx clear; neck supple  Lungs- Clear to ausculation bilaterally, normal work of breathing.  No wheezes, rales, rhonchi Heart- Regular rate and rhythm, no murmurs, rubs or gallops  GI- soft, non-tender, non-distended, bowel sounds present  Extremities- no clubbing, cyanosis, or edema  MS- no significant deformity or atrophy Skin- warm and dry, no rash or lesion; ILR pocket well healed Psych- euthymic mood, full affect Neuro- strength and sensation are intact  ILR Interrogation- reviewed in detail  today,  See PACEART report  EKG:  EKG is not ordered today.  Recent Labs: 01/10/2022: TSH 1.69 02/16/2022: Magnesium 2.2 04/04/2022: ALT 13 06/26/2022: Hemoglobin 12.5; Platelets 249 07/31/2022: B Natriuretic Peptide 227.9; BUN 14; Creatinine, Ser 1.51; Potassium 3.8; Sodium 140   Wt Readings from Last 3 Encounters:  07/31/22 194 lb 9.6 oz (88.3 kg)  07/26/22 191 lb 8 oz (86.9 kg)  07/11/22 196 lb (88.9 kg)     Other studies Reviewed: Additional studies/ records that were reviewed today include: Echo 07/2021 shows LVEF 55-60%, Previous EP office notes, Previous remote checks, Most recent labwork.   Assessment and Plan:  1. Atrial fibrillation s/p Medtronic Loop recorder He had PVI in 04/2019 and redo 01/2020 with touch up of left pulmonary vein and addition of box lesion By his device he has not had further AF since [facility] 07/10/2022 See Pace Art report No changes today With lung disease and suspect thyroid toxicity in past, poor candidate for amiodarone His CrCl makes him a very poor tikosyn candidate, and would only qualify for the lowest dose.  Poor candidate for flecainide with likely severe proximal LAD and RCA stenosis on CT Cor 10/2020 I think with his co-morbidities included partial lobectomy he is a poor candidate for re-do ablation and general anaesthesia, as it would likely be low yield. Additionally, he has not had further AF since his [facility] and still feels terrible.   2. CKD III Unfortunately his recent Cr levels would only qualify him for the lowest dose of tikosyn (125 mcg), but could consider this.   3. COPD PFTs in 1/18 showed moderately severe obstructive defect. CT chest in 11/18 showed emphysema. H/o R lower lobectomy Additionally complicates picture re: symptoms and makes poor candidate for amiodarone.  4. DOE 5. Decreased functional status He continues to feel worse despite maintaining NSR.  Do not think AF is the driving factor here. Have recommended he touch base  back with HF and Pulm clinics.  Suspect multifactorial, and ultimately may represent progression of his chronic pulmonary issues and deconditioning.   Current medicines are reviewed at length with the patient today.    Disposition:  Follow up with Dr. Myles Gip  in 6 Months for general follow up.  We could consider  Tikosyn, but he understands he would only qualify for lowest dose, and is not willing to consider admission. But as above, He is maintaining NSR and thus AF is not currently a driving contributor to his symptoms.    Jacalyn Lefevre, PA-C  08/08/2022 7:56 AM  [facility] [address] [place] [address] [phone] (office) [phone] (fax)

## 2022-08-13 ENCOUNTER — Ambulatory Visit (INDEPENDENT_AMBULATORY_CARE_PROVIDER_SITE_OTHER): Payer: PPO

## 2022-08-13 DIAGNOSIS — I480 Paroxysmal atrial fibrillation: Secondary | ICD-10-CM | POA: Diagnosis not present

## 2022-08-14 ENCOUNTER — Ambulatory Visit (HOSPITAL_COMMUNITY)
Admission: RE | Admit: 2022-08-14 | Discharge: 2022-08-14 | Disposition: A | Discharge status: Home / Self Care | Payer: PPO | Source: Ambulatory Visit | Attending: Cardiology | Admitting: Cardiology

## 2022-08-14 ENCOUNTER — Telehealth: Payer: Self-pay | Admitting: Internal Medicine

## 2022-08-14 VITALS — BP 164/80 | HR 67 | Wt 191.0 lb

## 2022-08-14 DIAGNOSIS — C187 Malignant neoplasm of sigmoid colon: Secondary | ICD-10-CM | POA: Insufficient documentation

## 2022-08-14 DIAGNOSIS — I5032 Chronic diastolic (congestive) heart failure: Secondary | ICD-10-CM

## 2022-08-14 DIAGNOSIS — R0602 Shortness of breath: Secondary | ICD-10-CM | POA: Diagnosis not present

## 2022-08-14 DIAGNOSIS — Z79899 Other long term (current) drug therapy: Secondary | ICD-10-CM | POA: Insufficient documentation

## 2022-08-14 DIAGNOSIS — Z7901 Long term (current) use of anticoagulants: Secondary | ICD-10-CM | POA: Insufficient documentation

## 2022-08-14 DIAGNOSIS — I714 Abdominal aortic aneurysm, without rupture, unspecified: Secondary | ICD-10-CM | POA: Diagnosis not present

## 2022-08-14 DIAGNOSIS — I480 Paroxysmal atrial fibrillation: Secondary | ICD-10-CM

## 2022-08-14 DIAGNOSIS — Z7984 Long term (current) use of oral hypoglycemic drugs: Secondary | ICD-10-CM | POA: Insufficient documentation

## 2022-08-14 DIAGNOSIS — Z85118 Personal history of other malignant neoplasm of bronchus and lung: Secondary | ICD-10-CM | POA: Diagnosis not present

## 2022-08-14 DIAGNOSIS — R195 Other fecal abnormalities: Secondary | ICD-10-CM

## 2022-08-14 DIAGNOSIS — R059 Cough, unspecified: Secondary | ICD-10-CM | POA: Diagnosis not present

## 2022-08-14 DIAGNOSIS — I130 Hypertensive heart and chronic kidney disease with heart failure and stage 1 through stage 4 chronic kidney disease, or unspecified chronic kidney disease: Secondary | ICD-10-CM | POA: Diagnosis not present

## 2022-08-14 DIAGNOSIS — I739 Peripheral vascular disease, unspecified: Secondary | ICD-10-CM | POA: Diagnosis not present

## 2022-08-14 DIAGNOSIS — J439 Emphysema, unspecified: Secondary | ICD-10-CM | POA: Diagnosis not present

## 2022-08-14 DIAGNOSIS — R269 Unspecified abnormalities of gait and mobility: Secondary | ICD-10-CM | POA: Diagnosis not present

## 2022-08-14 DIAGNOSIS — I4819 Other persistent atrial fibrillation: Secondary | ICD-10-CM | POA: Insufficient documentation

## 2022-08-14 DIAGNOSIS — I2510 Atherosclerotic heart disease of native coronary artery without angina pectoris: Secondary | ICD-10-CM | POA: Diagnosis not present

## 2022-08-14 DIAGNOSIS — J90 Pleural effusion, not elsewhere classified: Secondary | ICD-10-CM | POA: Diagnosis not present

## 2022-08-14 DIAGNOSIS — N183 Chronic kidney disease, stage 3 unspecified: Secondary | ICD-10-CM

## 2022-08-14 DIAGNOSIS — Z902 Acquired absence of lung [part of]: Secondary | ICD-10-CM | POA: Diagnosis not present

## 2022-08-14 DIAGNOSIS — E785 Hyperlipidemia, unspecified: Secondary | ICD-10-CM | POA: Diagnosis not present

## 2022-08-14 LAB — COMPREHENSIVE METABOLIC PANEL
ALT: 20 U/L (ref 0–44)
AST: 21 U/L (ref 15–41)
Albumin: 3.8 g/dL (ref 3.5–5.0)
Alkaline Phosphatase: 82 U/L (ref 38–126)
Anion gap: 12 (ref 5–15)
BUN: 18 mg/dL (ref 8–23)
CO2: 25 mmol/L (ref 22–32)
Calcium: 9.2 mg/dL (ref 8.9–10.3)
Chloride: 102 mmol/L (ref 98–111)
Creatinine, Ser: 1.41 mg/dL — ABNORMAL HIGH (ref 0.61–1.24)
GFR, Estimated: 50 mL/min — ABNORMAL LOW (ref >60)
Glucose, Bld: 132 mg/dL — ABNORMAL HIGH (ref 70–99)
Potassium: 3.6 mmol/L (ref 3.5–5.1)
Sodium: 139 mmol/L (ref 135–145)
Total Bilirubin: 0.4 mg/dL (ref 0.3–1.2)
Total Protein: 6.9 g/dL (ref 6.5–8.1)

## 2022-08-14 LAB — CUP PACEART REMOTE DEVICE CHECK
Date Time Interrogation Session: 20231112232033
Implantable Pulse Generator Implant Date: 20200709

## 2022-08-14 LAB — CBC
HCT: 42.8 % (ref 39.0–52.0)
Hemoglobin: 14.3 g/dL (ref 13.0–17.0)
MCH: 30.4 pg (ref 26.0–34.0)
MCHC: 33.4 g/dL (ref 30.0–36.0)
MCV: 91.1 fL (ref 80.0–100.0)
Platelets: 264 10*3/uL (ref 150–400)
RBC: 4.7 MIL/uL (ref 4.22–5.81)
RDW: 15.7 % — ABNORMAL HIGH (ref 11.5–15.5)
WBC: 4.1 10*3/uL (ref 4.0–10.5)
nRBC: 0 % (ref 0.0–0.2)

## 2022-08-14 LAB — BRAIN NATRIURETIC PEPTIDE: B Natriuretic Peptide: 186.5 pg/mL — ABNORMAL HIGH (ref 0.0–100.0)

## 2022-08-14 MED ORDER — AMLODIPINE BESYLATE 10 MG PO TABS: 10.0000 mg | ORAL_TABLET | Freq: Every day | ORAL | 3 refills | Status: DC

## 2022-08-14 NOTE — Telephone Encounter (Signed)
Pt and caregiver, Laurence Aly, called to report elevated BP.  BP Today:  173/79 & 180/93.  Transferred call to [facility] triage.

## 2022-08-14 NOTE — Patient Instructions (Addendum)
INCREASE Amlodipine to 10 mg daily.  Labs done today, your results will be available in MyChart, we will contact you for abnormal readings.  Non-Cardiac CT scanning, (CAT scanning), is a noninvasive, special x-ray that produces cross-sectional images of the body using x-rays and a computer. CT scans help physicians diagnose and treat medical conditions. For some CT exams, a contrast material is used to enhance visibility in the area of the body being studied. CT scans provide greater clarity and reveal more details than regular x-ray exams. ONCE APPROVED BY INSURANCE YOU WILL BE CALLED TO HAVE THE TEST ARRANGED.   Your physician recommends that you schedule a follow-up appointment in: 3 weeks  If you have any questions or concerns before your next appointment please send [name] a message through [name] or call our office at [phone].    TO LEAVE A MESSAGE FOR THE NURSE SELECT OPTION 2, PLEASE LEAVE A MESSAGE INCLUDING: YOUR NAME DATE OF BIRTH CALL BACK NUMBER REASON FOR CALL**this is important as we prioritize the call backs  YOU WILL RECEIVE A CALL BACK THE SAME DAY AS LONG AS YOU CALL BEFORE 4:00 PM  At the [facility], you and your health needs are our priority. As part of our continuing mission to provide you with exceptional heart care, we have created designated Provider Care Teams. These Care Teams include your primary Cardiologist (physician) and Advanced Practice Providers (APPs- Physician Assistants and Nurse Practitioners) who all work together to provide you with the care you need, when you need it.   You may see any of the following providers on your designated Care Team at your next follow up: Dr Glori Bickers Dr [name] Dr. Roxana Hires, NP Lyda Jester, [place] [facility] [place], [place] Forestine Na, NP Audry Riles, PharmD   Please be sure to bring in all your medications bottles to every appointment.

## 2022-08-14 NOTE — Telephone Encounter (Signed)
Please call him-is he having any concerning symptoms such as headaches, dizziness, palpitations or chest pain or shortness of breath.  This is the first time his blood pressure has been this elevated or was not normal the past couple of days and now is high today-any obvious cause?

## 2022-08-14 NOTE — Telephone Encounter (Signed)
Pt left VM on scheduling line about elevated bp please advise

## 2022-08-14 NOTE — Progress Notes (Signed)
Patient ID: Troy Reddy [name], male   DOB: March 23, 1941, 81 y.o.   MRN: [id number] PCP: Dr. Quay Burow Cardiology: [name]  81 y.o. with history of HTN, COPD, active smoking/COPD, carotid stenosis s/p right CEA, paroxysmal atrial fibrillation, and PAD.   He does not have known obstructive CAD but is at high risk for CAD based on his comorbidities.  Lexiscan Cardiolite in 3/14 showed no ischemia or infarction and echo in 3/14 showed normal EF.  He had a left fem-pop bypass at the [facility] in the '90s.  He is followed at [facility] for PAD. He has chronic right calf/thigh/buttocks claudication that is unchanged over the last few years and follows regularly at [facility].  He has had right lower lobectomy for lung cancer.  He continues to stay off cigarettes.    At a prior appointment, he was in atrial fibrillation but did not realize it. Dr Aundra Dubin  started him on Eliquis and diltiazem CD, and he spontaneously converted to NSR.  In 5/15, he was at [facility] and felt "strange" one day: fatigued, weak, short of breath.  He went to the ER and was in atrial fibrillation with HR in 80s-90s.  He spontaneously converted to NSR in the ER.  He felt back to normal after converting to NSR.  Started on Multaq 400 mg bid.  With CHF, he was eventually transitioned over to amiodarone.   He had patch angioplasty revision of left fem-pop bypass in 11/16.  Now with minimal claudication.  He follows with [facility].     He had a Cardiolite and echo in 7/17 that were unremarkable.  Repeat Cardiolite in 12/17 showed no ischemia, echo was uninterpretable.  Cardiac MRI was therefore done in 1/18, showing EF 66% with normal-appearing RV, no late gadolinium enhancement.   Given increased exertional dyspnea and a defect on Cardiolite, he had LHC in 11/18.  This showed no obstructive CAD. He had a chest CT done given concern for possible amiodarone lung toxicity with increased dyspnea. This showed emphysema, no ILD.    In 5/19, he developed a  probable viral syndrome with dehydration and had a presyncopal episode.  He was orthostatic in the hospital.  He was noted to be in atrial fibrillation this admission which remained persistent.  He had a TEE-guided DCCV back to NSR.   In 9/19, he was admitted with GI bleeding and found to have a sigmoid mass on colonoscopy, path showed sigmoid adenocarcinoma.  He was noted to have right paratracheal lymphadenopathy but EUS with biopsy in 10/19 did not show malignancy.  He had surgical resection of colon cancer.   Amiodarone was stopped with elevated ESR and increased dyspnea.  He was also found to have hyperthyroidism. He was treated with methimazole by endocrinology.    He had atrial fibrillation ablation in 7/20.   Echo in 4/21 showed EF 60-65%, normal RV.    In 5/21, due to high atrial fibrillation burden, he had redo atrial fibrillation ablation.   Cardiolite in 6/21 showed EF 70%, no ischemia/infarction.   He was admitted in 10/22 with COVID-19 and COPD exacerbation.  He had a thoracentesis, cytology was negative for cancerous cells.  Echo in 10/22 showed EF 55-60%, moderate LVH, mildly decreased RV systolic function.   He was found to have choledocholithiasis with elevated LFTs, had ERCP with sphincterotomy in 5/23, refused cholecystectomy.  He was also admitted in 5/23 with upper GI bleeding.  He was found to have a duodenal ulcer on EGD.  Follow up 8/23, found to be in atrial fibrillation with slow ventricular rate. Mildly volume overloaded and instructed to increase Lasix to 80 bid x 3 days then Lasix 80/60. S/p DCCV to NSR on 07/10/22. SCr noted to be 2.19, Lasix decreased to 60 bid and losartan stopped. Eliquis dose decreased to 2.5 bid.  Today he returns for HF follow up.  He has been feeling poorly for 2 wks.  He has a left-sided frontal headache that comes and goes throughout the day for the last 2 wks. He has also noted his BP running high, SBP almost up to 200 at times, at home.   BP today is 164/80.  His balance has been poor and he sometimes has to lean against the wall.  No falls.  He is short of breath walking more than a couple hundred feet.  This seems worse than in the past.  No chest pain.  No orthopnea/PND.  Weight is down 3 lbs.  He remains in NSR today and denies palpitations.  No wheezing, no coughing, no fever, no congestion.  No COVID exposures that he knows of.   ECG (personally reviewed): NSR, RBBB  REDS clip 26%  Labs (3/13): LDL 75, HDL 35, K 4.1, creatinine 1.1 Labs (3/14): LDL 91, HDL 27 Labs (10/14): K 4.3, creatinine 0.98 Labs (4/15): K 4.9, creatinine 1.1, LDL 76, HDL 30 Labs (5/15): K 4.3, creatinine 1.7, BNP 251 Labs (6/15): K 4.4, creatinine 1.3 Labs (10/15): TSH normal Labs (5/16): K 4.2, creatinine 1.12, HCT 39.5, LFTs normal, LDL 84, HDL 43, TSH normal Labs (11/16): creatinine 0.94 Labs (4/17): LDL 39, HDL 39 Labs (6/17): K 4.5, creatinine 1.2 Labs (9/17): K 4, creatinine 1.25, HCT 38.7 Labs (12/17): K 4.5, creatinine 1.49 => 1.62, BNP 43 Labs (2/18): K 3.8, creatinine 1.33, LFTs normal, TSH normal Labs (6/18): K 4.3, creatinine 1.49, LFTs normal, TSH normal, hgb 12.3 Labs (11/18): ESR 60, TSH normal, LFTs normal, creatinine 1.34 Labs (1/19): LDL 50 Labs (5/19): K 4.6, creatinine 1.55, LFTs normal, hgb 12.7, LFTs normal Labs (10/19): K 4 => 4.1, creatinine 1.39 => 1.43, BNP 279, hgb 10.7, TSH low, free T4 and free T3 low, ESR 108 Labs (3/21): K 4.5, creatinine 1.19, TSH normal Labs (4/21): K 4.8, creatinine 1.28, BNP 524, LDL 59 Labs (7/21): hgb 11.5, creatinine 1.4 Labs (8/21): K 4.3, creatinine 1.09 Labs (9/21): hgb 12.7 Labs (2/22): K 5, creatinine 1.54 Labs (5/22): K 4.2, creatinine 1.38, hgb 14.3, LDL 31 Labs (10/22): LDL 41, TGs 351, hgb 13.4, K 4.3, creatinine 1.57 Labs (1122): K 3.6, creatinine 1.32 Labs (1/23): K 4.0, creatinine 1.31, LDL 34 Labs (4/23): K 4.2, creatinine 1.66, LFTs normal, hgb 13.3, TSH normal Labs  (5/23): K 3.2, creatinine 1.49 Labs (7/23): hgb 12 Labs (8/23): K 4.3, creatinine 1.76 Labs (9/23): hgb 12.5 Labs (10/23): K 4.5, creatinine 1.96 => 1.51  PMH: 1. HTN: Fatigue and cough with ramipril use.  2. COPD: Quit smoking 2014. PFTs (1/18) with moderately severe COPD.  - CT chest (11/18): Emphysema noted, no ILD.  3. AAA: CT 1/13 with 3.0 cm AAA.  Abdominal US (1/14) with 3.4 cm AAA. Abdominal US (4/15) with 3.25 x 3.27 AAA. Abdominal US (3/16) with 3.7 cm AAA.  Followed at [facility].  - Abd [name] (1/19): 3.3 cm AAA. 4. Squamous cell lung cancer diagnosed 2/12.  Had right lower lobectomy in 4/12.  5. Hyperlipidemia 6. PAD: Left fem-pop bypass 1994.  ABIs (2/12) 0.62 on right, 0.95 on left.  ABIs (1/14): 0.65 on right, 1.04 on left. ABIs (4/15) 0.66 right, 0.87 left.  ABIs (3/16) 0.6 right, 0.88 left.  Patch angioplasty left fem-pop bypass in 11/16.   - Left fem-pop bypass patent on doppler evaluation in 12/17.  - ABIs (2/21): normal on left (1.02), moderately decreased on right (0.54).  - Peripheral arterial dopplers (3/23): >50% right iliac stenosis, ABIs 0.58 on right and 1.11 on left.  7. CAD: Stress myoview 2004 was normal. Lexiscan Cardiolite (3/14) with EF 66%, no ischemia or infarction. Lexiscan Cardiolite (7/17) with EF 61%, no ischemia/infarction.  - Cardiolite (12/17) with EF 62%, inferior/inferolateral fixed defect, most likely diaphragmatic attenuation, no ischemia.  - Cardiolite (9/18) with EF > 65%, fixed inferior defect (attenuation versus infarction), no ischemia.  - LHC (11/18): No obstructive CAD.  - Cardiolite (6/21): EF 70%, no ischemia/infarction 8. Chronic diastolic CHF: Echo (8/58): technically difficult with EF 55-60%, upper normal RV size.  Echo (5/16) with EF 60-65%.  Echo (7/17) with EF 55-60%, normal RV size and systolic function.  - Echo 12/17 with very poor windows, unable to comment on LV or RV function.  - Cardiac MRI (1/18) with EF 66%, normal RV size and  systolic function.  - TEE (5/19): EF 55-60%, normal RV size and systolic function.  - Echo (4/21): EF 60-65%, normal RV - Echo (10/22): EF 55-60%, moderate LVH, mildly decreased RV systolic function 9. Carotid stenosis: TIA 10/14.  Carotid dopplers with > 85% RICA, 02-77% LICA.  Patient had right CEA in 10/14. Carotids (4/15) patent right CEA, LICA 41-28% stenosis.  Carotids (78/67) < 67% LICA, patent right CEA.  - Carotid dopplers (12/17): right CEA ok, < 20% LICA stenosis.  - Carotid dopplers (1/19): Right CEA ok, 9-47% LICA stenosis.  - Carotid dopplers (2/21): Right CEA ok, LICA 09-62% stenosis.  - Carotid dopplers (3/23): Right CEA ok, LICA 8-36% 10. Atrial fibrillation: Paroxysmal. First noted after lobectomy in 4/12 (brief), recurrence in 4/15 then in 5/15.  - TEE-guided DCCV for persistent atrial fibrillation in 5/19.  - Atrial fibrillation ablation 7/20.  - Re-do atrial fibrillation ablation 5/21.  - DCCV 10/23 11. Spinal stenosis 12. OSA: Using CPAP.  13. Glaucoma 14. Has ILR 15. Colon cancer: Sigmoid adenocarcinoma, s/p surgical resection.  16. 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  17. COVID-19 10/22 18. Choledocholithiasis 19. Upper GI bleeding in 5/23 from duodenal ulcer.   SH: Married, lives in [place].  Former [place].  Quit smoking in 2014.  Rare ETOH now.   FH: No premature CAD  ROS: All systems reviewed and negative except as per HPI.   Current Outpatient Medications  Medication Sig Dispense Refill   acetaminophen (TYLENOL) 325 MG tablet Take 650 mg by mouth every 6 (six) hours as needed for moderate pain or headache.     albuterol (VENTOLIN HFA) 108 (90 Base) MCG/ACT inhaler Inhale 2 puffs into the lungs every 6 (six) hours as needed for wheezing or shortness of breath. 8.5 g 0   apixaban (ELIQUIS) 5 MG TABS tablet Take 1/2 tab Twice daily 60 tablet 6   Brinzolamide-Brimonidine (SIMBRINZA) 1-0.2 % SUSP Place 1 drop into both  eyes 3 (three) times daily.     Budeson-Glycopyrrol-Formoterol (BREZTRI AEROSPHERE) 160-9-4.8 MCG/ACT AERO Inhale 2 puffs into the lungs in the morning and at bedtime. (Patient taking differently: Inhale 2 puffs into the lungs daily.) 10.7 g 0   docusate sodium (COLACE) 100 MG capsule Take 100 mg by mouth 2 (two)  times daily.      escitalopram (LEXAPRO) 10 MG tablet TAKE ONE TABLET BY MOUTH EVERY DAY 28 tablet 5   furosemide (LASIX) 20 MG tablet Take 3 tablets (60 mg total) by mouth 2 (two) times daily. 450 tablet 3   JARDIANCE 10 MG TABS tablet TAKE ONE TABLET BY MOUTH EVERY DAY BEFORE BREAKFAST. 30 tablet 11   Melatonin 10 MG TABS Take 20 mg by mouth at bedtime.     metoprolol succinate (TOPROL-XL) 25 MG 24 hr tablet Take 1 tablet (25 mg total) by mouth daily. 90 tablet 3   montelukast (SINGULAIR) 10 MG tablet TAKE ONE TABLET BY MOUTH AT BEDTIME 28 tablet 5   Multiple Vitamins-Minerals (CENTRUM SILVER PO) Take 1 tablet by mouth daily.     omeprazole (PRILOSEC) 20 MG capsule Take 1 capsule (20 mg total) by mouth 2 (two) times daily. 60 capsule 3   ondansetron (ZOFRAN) 4 MG tablet Take 1 tablet (4 mg total) by mouth every 6 (six) hours as needed for nausea. 20 tablet 0   OVER THE COUNTER MEDICATION Take 3 capsules by mouth daily. Balance of Nature Fruits     Polyethyl Glycol-Propyl Glycol (SYSTANE ULTRA) 0.4-0.3 % SOLN Place 1 drop into both eyes daily as needed (dry eyes).     potassium chloride SA (KLOR-CON M) 20 MEQ tablet TAKE TWO TABLETS BY MOUTH IN THE MORNING AND TAKE ONE TABLET IN THE EVENING 90 tablet 6   rosuvastatin (CRESTOR) 40 MG tablet TAKE ONE TABLET BY MOUTH EVERY DAY 90 tablet 3   senna (SENOKOT) 8.6 MG tablet Take 1 tablet by mouth 2 (two) times daily.     vitamin B-12 (CYANOCOBALAMIN) 1000 MCG tablet Take 1 tablet (1,000 mcg total) by mouth daily. 30 tablet 0   amLODipine (NORVASC) 10 MG tablet Take 1 tablet (10 mg total) by mouth daily. 90 tablet 3   No current  facility-administered medications for this encounter.   Wt Readings from Last 3 Encounters:  08/14/22 86.6 kg (191 lb)  08/08/22 85.7 kg (189 lb)  07/31/22 88.3 kg (194 lb 9.6 oz)   BP (!) 164/80   Pulse 67   Wt 86.6 kg (191 lb)   SpO2 95%   BMI 24.86 kg/m    General: NAD Neck: JVP 7-8 cm, no thyromegaly or thyroid nodule.  Lungs: Mildly decreased BS on right.  CV: Nondisplaced PMI.  Heart regular S1/S2, no S3/S4, no murmur.  Trace ankle edema.  No carotid bruit.  Difficult to palpate pedal pulses.  Abdomen: Soft, nontender, no hepatosplenomegaly, no distention.  Skin: Intact without lesions or rashes.  Neurologic: Alert and oriented x 3.  Psych: Normal affect. Extremities: No clubbing or cyanosis.  HEENT: Normal.   Assessment/Plan: 1. Chronic diastolic CHF: Echo in 23/55 with EF 55-60%, mild LVH, mildly dysfunctional RV.  Chronic NYHA class III symptoms.  Exertional dyspnea seems worse but weight is down and REDS clip not elevated at 26%.  He does not look particularly volume overloaded on exam.  - Continue Lasix 60 mg bid. BMET and BNP today. - Continue KCl 40 qam/20 qpm.  - Continue Jardiance 10 mg daily. No GU symptoms. - I will get CXR today given dyspnea but no definite evidence for CHF/volume overload.  2. Hyperlipidemia: 1/23 lipids with good LDL. - Continue Crestor 40 mg daily. Check lipids next appt. - Continue Vascepa 2 g bid. 3. Carotid stenosis: s/p R CEA.  Dopplers followed at [facility].  4. PAD: s/p patch angioplasty  to left fem-pop bypass in 11/16.  Followed at [facility].  Peripheral arterial dopplers in 3/23 with >50% right iliac stenosis, but no rest pain or pedal ulcerations.  Rare mild claudication.  - Follows with [facility].  5. AAA: Stable on last [name], followed at [facility].   6. COPD: No longer smoking. PFTs in 1/18 showed moderately severe obstructive defect. CT chest in 11/18 showed emphysema.  COPD contributes to his dyspnea.  Oxygen saturation 95% on RA today.  - Suspect this  is a significant contributor to his dyspnea.  He is going to call for pulmonary followup.  - CXR today.   7. Atrial fibrillation: S/p atrial fibrillation ablation in 7/20 and redo in 5/21.  He has been primarily in NSR since redo ablation.  Back in slow atrial fibrillation 9/23 and s/p DCCV 10/23 to NSR. In NSR on ECG today.  He saw EP recently, poor candidate for redo ablation.  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 GFR would limit Tikosyn to the lowest dose, probably not a good choice for him.  - Continue Toprol XL 25 mg daily.  - Continue Eliquis 2.5 mg bid. (Reduced dose with creatinine and age). - Continue to limit ETOH.  - Continue to use CPAP.  8. CKD: Stage 3.  BMET today.  9. CAD: LHC in 11/18 with nonobstructive disease only.  Cardiolite in 6/21 with no ischemia/infarction. No chest pain.  10. 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  He is no longer on methimazole, follows with endocrinology.     11. Colon cancer: Sigmoid adenocarcinoma, s/p surgical resection. In remission.  12. HTN: Elevated off losartan. - Increase amlodipine to 10 mg daily.  13. Pleural effusion: Chronic right pleural effusion, s/p right lower lobectomy.   14. Squamous cell lung cancer: S/p right lower lobectomy.  Follows with Dr. Valeta Harms.  15. Upper GI bleeding: In 5/23 due to duodenal ulcer. No abnormal bleeding. - Continue PPI.  - Check CBC.  16. Neurology: Patient has had a headache on and off for about 2 wks, this is new.  BP is also quite high.  He additionally has noted problems with his balance though he has not fallen.  - I will order CT head w/o contrast to assess for evidence of CVA.  - Control BP as above, may be contributing to headache.   Follow up in 3 wks   [name]  08/14/2022

## 2022-08-14 NOTE — Telephone Encounter (Signed)
Noted  

## 2022-08-14 NOTE — Telephone Encounter (Signed)
Spoke with pt and Dr.McLean. Pt will see Dr.McLean today at 3pm.

## 2022-08-14 NOTE — Progress Notes (Signed)
ReDS Vest / Clip - 08/14/22 1600       ReDS Vest / Clip   Station Marker D    Ruler Value 37    ReDS Value Range Low volume    ReDS Actual Value 26

## 2022-08-14 NOTE — Telephone Encounter (Signed)
The triage nurse called to let us know that she advised patient to go to the ER but he stated that he did not want to go to the ER due to the extended wait times.

## 2022-08-20 ENCOUNTER — Other Ambulatory Visit: Payer: Self-pay | Admitting: Internal Medicine

## 2022-08-20 ENCOUNTER — Other Ambulatory Visit: Payer: Self-pay | Admitting: Physician Assistant

## 2022-08-20 NOTE — Telephone Encounter (Signed)
Prescription refill request for Eliquis received. Indication:afib Last office visit:1/23 Scr:1.4 Age: 81 Weight:86.6  kg  Prescription refilled

## 2022-08-21 ENCOUNTER — Other Ambulatory Visit (HOSPITAL_COMMUNITY): Payer: PPO

## 2022-08-24 ENCOUNTER — Inpatient Hospital Stay (HOSPITAL_COMMUNITY)
Admission: EM | Admit: 2022-08-24 | Discharge: 2022-08-29 | DRG: 190 | Disposition: A | Discharge status: Skilled Nursing Facility | Payer: PPO | Attending: Family Medicine | Admitting: Family Medicine

## 2022-08-24 ENCOUNTER — Encounter (HOSPITAL_COMMUNITY): Payer: Self-pay

## 2022-08-24 ENCOUNTER — Other Ambulatory Visit: Payer: Self-pay

## 2022-08-24 ENCOUNTER — Observation Stay (HOSPITAL_COMMUNITY): Payer: PPO

## 2022-08-24 ENCOUNTER — Emergency Department (HOSPITAL_COMMUNITY): Payer: PPO

## 2022-08-24 DIAGNOSIS — R410 Disorientation, unspecified: Secondary | ICD-10-CM | POA: Diagnosis not present

## 2022-08-24 DIAGNOSIS — G4733 Obstructive sleep apnea (adult) (pediatric): Secondary | ICD-10-CM | POA: Diagnosis not present

## 2022-08-24 DIAGNOSIS — R778 Other specified abnormalities of plasma proteins: Secondary | ICD-10-CM | POA: Diagnosis not present

## 2022-08-24 DIAGNOSIS — R519 Headache, unspecified: Secondary | ICD-10-CM | POA: Diagnosis not present

## 2022-08-24 DIAGNOSIS — E1122 Type 2 diabetes mellitus with diabetic chronic kidney disease: Secondary | ICD-10-CM | POA: Diagnosis not present

## 2022-08-24 DIAGNOSIS — I10 Essential (primary) hypertension: Secondary | ICD-10-CM | POA: Diagnosis not present

## 2022-08-24 DIAGNOSIS — Z959 Presence of cardiac and vascular implant and graft, unspecified: Secondary | ICD-10-CM | POA: Diagnosis not present

## 2022-08-24 DIAGNOSIS — Z95818 Presence of other cardiac implants and grafts: Secondary | ICD-10-CM

## 2022-08-24 DIAGNOSIS — T501X5A Adverse effect of loop [high-ceiling] diuretics, initial encounter: Secondary | ICD-10-CM | POA: Diagnosis not present

## 2022-08-24 DIAGNOSIS — N289 Disorder of kidney and ureter, unspecified: Secondary | ICD-10-CM

## 2022-08-24 DIAGNOSIS — F39 Unspecified mood [affective] disorder: Secondary | ICD-10-CM | POA: Diagnosis not present

## 2022-08-24 DIAGNOSIS — I5032 Chronic diastolic (congestive) heart failure: Secondary | ICD-10-CM | POA: Diagnosis not present

## 2022-08-24 DIAGNOSIS — R7989 Other specified abnormal findings of blood chemistry: Secondary | ICD-10-CM

## 2022-08-24 DIAGNOSIS — F32A Depression, unspecified: Secondary | ICD-10-CM | POA: Diagnosis not present

## 2022-08-24 DIAGNOSIS — Z86010 Personal history of colonic polyps: Secondary | ICD-10-CM

## 2022-08-24 DIAGNOSIS — Z7901 Long term (current) use of anticoagulants: Secondary | ICD-10-CM | POA: Diagnosis not present

## 2022-08-24 DIAGNOSIS — Z823 Family history of stroke: Secondary | ICD-10-CM

## 2022-08-24 DIAGNOSIS — R0602 Shortness of breath: Secondary | ICD-10-CM | POA: Diagnosis not present

## 2022-08-24 DIAGNOSIS — J90 Pleural effusion, not elsewhere classified: Secondary | ICD-10-CM | POA: Diagnosis not present

## 2022-08-24 DIAGNOSIS — Z7951 Long term (current) use of inhaled steroids: Secondary | ICD-10-CM

## 2022-08-24 DIAGNOSIS — Z803 Family history of malignant neoplasm of breast: Secondary | ICD-10-CM

## 2022-08-24 DIAGNOSIS — R112 Nausea with vomiting, unspecified: Secondary | ICD-10-CM | POA: Diagnosis not present

## 2022-08-24 DIAGNOSIS — N1832 Chronic kidney disease, stage 3b: Secondary | ICD-10-CM | POA: Diagnosis not present

## 2022-08-24 DIAGNOSIS — J440 Chronic obstructive pulmonary disease with acute lower respiratory infection: Secondary | ICD-10-CM | POA: Diagnosis not present

## 2022-08-24 DIAGNOSIS — I714 Abdominal aortic aneurysm, without rupture, unspecified: Secondary | ICD-10-CM | POA: Diagnosis present

## 2022-08-24 DIAGNOSIS — I5031 Acute diastolic (congestive) heart failure: Secondary | ICD-10-CM

## 2022-08-24 DIAGNOSIS — I5033 Acute on chronic diastolic (congestive) heart failure: Secondary | ICD-10-CM | POA: Diagnosis not present

## 2022-08-24 DIAGNOSIS — J189 Pneumonia, unspecified organism: Secondary | ICD-10-CM | POA: Diagnosis present

## 2022-08-24 DIAGNOSIS — Z85038 Personal history of other malignant neoplasm of large intestine: Secondary | ICD-10-CM

## 2022-08-24 DIAGNOSIS — E785 Hyperlipidemia, unspecified: Secondary | ICD-10-CM | POA: Diagnosis not present

## 2022-08-24 DIAGNOSIS — J9621 Acute and chronic respiratory failure with hypoxia: Secondary | ICD-10-CM | POA: Diagnosis not present

## 2022-08-24 DIAGNOSIS — I779 Disorder of arteries and arterioles, unspecified: Secondary | ICD-10-CM | POA: Diagnosis present

## 2022-08-24 DIAGNOSIS — J441 Chronic obstructive pulmonary disease with (acute) exacerbation: Principal | ICD-10-CM

## 2022-08-24 DIAGNOSIS — I2489 Other forms of acute ischemic heart disease: Secondary | ICD-10-CM | POA: Diagnosis not present

## 2022-08-24 DIAGNOSIS — Z66 Do not resuscitate: Secondary | ICD-10-CM | POA: Diagnosis present

## 2022-08-24 DIAGNOSIS — Z902 Acquired absence of lung [part of]: Secondary | ICD-10-CM

## 2022-08-24 DIAGNOSIS — H409 Unspecified glaucoma: Secondary | ICD-10-CM | POA: Diagnosis present

## 2022-08-24 DIAGNOSIS — Z9989 Dependence on other enabling machines and devices: Secondary | ICD-10-CM | POA: Diagnosis not present

## 2022-08-24 DIAGNOSIS — Z85118 Personal history of other malignant neoplasm of bronchus and lung: Secondary | ICD-10-CM

## 2022-08-24 DIAGNOSIS — I509 Heart failure, unspecified: Secondary | ICD-10-CM

## 2022-08-24 DIAGNOSIS — Z888 Allergy status to other drugs, medicaments and biological substances status: Secondary | ICD-10-CM

## 2022-08-24 DIAGNOSIS — I5043 Acute on chronic combined systolic (congestive) and diastolic (congestive) heart failure: Secondary | ICD-10-CM | POA: Diagnosis not present

## 2022-08-24 DIAGNOSIS — Z882 Allergy status to sulfonamides status: Secondary | ICD-10-CM

## 2022-08-24 DIAGNOSIS — J9600 Acute respiratory failure, unspecified whether with hypoxia or hypercapnia: Secondary | ICD-10-CM | POA: Diagnosis not present

## 2022-08-24 DIAGNOSIS — F05 Delirium due to known physiological condition: Secondary | ICD-10-CM | POA: Diagnosis not present

## 2022-08-24 DIAGNOSIS — Z8673 Personal history of transient ischemic attack (TIA), and cerebral infarction without residual deficits: Secondary | ICD-10-CM

## 2022-08-24 DIAGNOSIS — Z7984 Long term (current) use of oral hypoglycemic drugs: Secondary | ICD-10-CM

## 2022-08-24 DIAGNOSIS — Z95828 Presence of other vascular implants and grafts: Secondary | ICD-10-CM

## 2022-08-24 DIAGNOSIS — Z800 Family history of malignant neoplasm of digestive organs: Secondary | ICD-10-CM

## 2022-08-24 DIAGNOSIS — I130 Hypertensive heart and chronic kidney disease with heart failure and stage 1 through stage 4 chronic kidney disease, or unspecified chronic kidney disease: Secondary | ICD-10-CM | POA: Diagnosis not present

## 2022-08-24 DIAGNOSIS — I482 Chronic atrial fibrillation, unspecified: Secondary | ICD-10-CM | POA: Diagnosis not present

## 2022-08-24 DIAGNOSIS — Z880 Allergy status to penicillin: Secondary | ICD-10-CM

## 2022-08-24 DIAGNOSIS — I480 Paroxysmal atrial fibrillation: Secondary | ICD-10-CM | POA: Diagnosis not present

## 2022-08-24 DIAGNOSIS — Z8711 Personal history of peptic ulcer disease: Secondary | ICD-10-CM

## 2022-08-24 DIAGNOSIS — I110 Hypertensive heart disease with heart failure: Secondary | ICD-10-CM | POA: Diagnosis not present

## 2022-08-24 DIAGNOSIS — F419 Anxiety disorder, unspecified: Secondary | ICD-10-CM | POA: Diagnosis not present

## 2022-08-24 DIAGNOSIS — J9601 Acute respiratory failure with hypoxia: Secondary | ICD-10-CM | POA: Diagnosis not present

## 2022-08-24 DIAGNOSIS — I959 Hypotension, unspecified: Secondary | ICD-10-CM | POA: Diagnosis not present

## 2022-08-24 DIAGNOSIS — N400 Enlarged prostate without lower urinary tract symptoms: Secondary | ICD-10-CM | POA: Diagnosis present

## 2022-08-24 DIAGNOSIS — Z1152 Encounter for screening for COVID-19: Secondary | ICD-10-CM | POA: Diagnosis not present

## 2022-08-24 DIAGNOSIS — I739 Peripheral vascular disease, unspecified: Secondary | ICD-10-CM | POA: Diagnosis not present

## 2022-08-24 DIAGNOSIS — Z8249 Family history of ischemic heart disease and other diseases of the circulatory system: Secondary | ICD-10-CM

## 2022-08-24 DIAGNOSIS — N1831 Chronic kidney disease, stage 3a: Secondary | ICD-10-CM | POA: Diagnosis present

## 2022-08-24 DIAGNOSIS — Z79899 Other long term (current) drug therapy: Secondary | ICD-10-CM

## 2022-08-24 DIAGNOSIS — Z85828 Personal history of other malignant neoplasm of skin: Secondary | ICD-10-CM

## 2022-08-24 DIAGNOSIS — R0902 Hypoxemia: Secondary | ICD-10-CM | POA: Diagnosis not present

## 2022-08-24 DIAGNOSIS — I2510 Atherosclerotic heart disease of native coronary artery without angina pectoris: Secondary | ICD-10-CM | POA: Diagnosis present

## 2022-08-24 DIAGNOSIS — Z87891 Personal history of nicotine dependence: Secondary | ICD-10-CM

## 2022-08-24 DIAGNOSIS — Z961 Presence of intraocular lens: Secondary | ICD-10-CM | POA: Diagnosis present

## 2022-08-24 DIAGNOSIS — E860 Dehydration: Secondary | ICD-10-CM | POA: Diagnosis present

## 2022-08-24 DIAGNOSIS — J9690 Respiratory failure, unspecified, unspecified whether with hypoxia or hypercapnia: Secondary | ICD-10-CM | POA: Diagnosis not present

## 2022-08-24 DIAGNOSIS — E1151 Type 2 diabetes mellitus with diabetic peripheral angiopathy without gangrene: Secondary | ICD-10-CM | POA: Diagnosis not present

## 2022-08-24 DIAGNOSIS — J439 Emphysema, unspecified: Secondary | ICD-10-CM | POA: Diagnosis not present

## 2022-08-24 DIAGNOSIS — Z9181 History of falling: Secondary | ICD-10-CM

## 2022-08-24 DIAGNOSIS — T68XXXA Hypothermia, initial encounter: Secondary | ICD-10-CM | POA: Diagnosis not present

## 2022-08-24 DIAGNOSIS — N179 Acute kidney failure, unspecified: Secondary | ICD-10-CM | POA: Diagnosis present

## 2022-08-24 DIAGNOSIS — R2681 Unsteadiness on feet: Secondary | ICD-10-CM | POA: Diagnosis present

## 2022-08-24 DIAGNOSIS — N183 Chronic kidney disease, stage 3 unspecified: Secondary | ICD-10-CM | POA: Diagnosis present

## 2022-08-24 DIAGNOSIS — R32 Unspecified urinary incontinence: Secondary | ICD-10-CM | POA: Diagnosis not present

## 2022-08-24 DIAGNOSIS — R197 Diarrhea, unspecified: Secondary | ICD-10-CM | POA: Diagnosis not present

## 2022-08-24 DIAGNOSIS — H547 Unspecified visual loss: Secondary | ICD-10-CM | POA: Diagnosis present

## 2022-08-24 DIAGNOSIS — R0689 Other abnormalities of breathing: Secondary | ICD-10-CM | POA: Diagnosis not present

## 2022-08-24 DIAGNOSIS — K219 Gastro-esophageal reflux disease without esophagitis: Secondary | ICD-10-CM | POA: Diagnosis present

## 2022-08-24 LAB — CBC WITH DIFFERENTIAL/PLATELET
Abs Immature Granulocytes: 0.01 10*3/uL (ref 0.00–0.07)
Basophils Absolute: 0 10*3/uL (ref 0.0–0.1)
Basophils Relative: 0 %
Eosinophils Absolute: 0 10*3/uL (ref 0.0–0.5)
Eosinophils Relative: 0 %
HCT: 50 % (ref 39.0–52.0)
Hemoglobin: 16.9 g/dL (ref 13.0–17.0)
Immature Granulocytes: 0 %
Lymphocytes Relative: 4 %
Lymphs Abs: 0.2 10*3/uL — ABNORMAL LOW (ref 0.7–4.0)
MCH: 31 pg (ref 26.0–34.0)
MCHC: 33.8 g/dL (ref 30.0–36.0)
MCV: 91.6 fL (ref 80.0–100.0)
Monocytes Absolute: 0.2 10*3/uL (ref 0.1–1.0)
Monocytes Relative: 4 %
Neutro Abs: 5 10*3/uL (ref 1.7–7.7)
Neutrophils Relative %: 92 %
Platelets: 264 10*3/uL (ref 150–400)
RBC: 5.46 MIL/uL (ref 4.22–5.81)
RDW: 15.8 % — ABNORMAL HIGH (ref 11.5–15.5)
WBC: 5.5 10*3/uL (ref 4.0–10.5)
nRBC: 0 % (ref 0.0–0.2)

## 2022-08-24 LAB — URINALYSIS, ROUTINE W REFLEX MICROSCOPIC
Bacteria, UA: NONE SEEN
Bilirubin Urine: NEGATIVE
Glucose, UA: >=500 mg/dL — AB
Hgb urine dipstick: NEGATIVE
Ketones, ur: 5 mg/dL — AB
Leukocytes,Ua: NEGATIVE
Nitrite: NEGATIVE
Protein, ur: 30 mg/dL — AB
Specific Gravity, Urine: 1.022 (ref 1.005–1.030)
pH: 5 (ref 5.0–8.0)

## 2022-08-24 LAB — BASIC METABOLIC PANEL
Anion gap: 19 — ABNORMAL HIGH (ref 5–15)
BUN: 26 mg/dL — ABNORMAL HIGH (ref 8–23)
CO2: 17 mmol/L — ABNORMAL LOW (ref 22–32)
Calcium: 9.4 mg/dL (ref 8.9–10.3)
Chloride: 105 mmol/L (ref 98–111)
Creatinine, Ser: 1.95 mg/dL — ABNORMAL HIGH (ref 0.61–1.24)
GFR, Estimated: 34 mL/min — ABNORMAL LOW (ref >60)
Glucose, Bld: 184 mg/dL — ABNORMAL HIGH (ref 70–99)
Potassium: 4.1 mmol/L (ref 3.5–5.1)
Sodium: 141 mmol/L (ref 135–145)

## 2022-08-24 LAB — TROPONIN I (HIGH SENSITIVITY)
Troponin I (High Sensitivity): 20 ng/L — ABNORMAL HIGH (ref <18)
Troponin I (High Sensitivity): 38 ng/L — ABNORMAL HIGH (ref <18)
Troponin I (High Sensitivity): 114 ng/L (ref <18)

## 2022-08-24 LAB — BRAIN NATRIURETIC PEPTIDE: B Natriuretic Peptide: 256.7 pg/mL — ABNORMAL HIGH (ref 0.0–100.0)

## 2022-08-24 LAB — ECHOCARDIOGRAM COMPLETE
Area-P 1/2: 4.89 cm2
Height: 73 in
Weight: 3008 oz

## 2022-08-24 MED ORDER — LATANOPROST 0.005 % OP SOLN
1.0000 [drp] | Freq: Every day | OPHTHALMIC | Status: DC
  Administered 2022-08-24 – 2022-08-28 (×5): 1 [drp] via OPHTHALMIC
  Filled 2022-08-24: qty 2.5

## 2022-08-24 MED ORDER — OXYCODONE HCL 5 MG PO TABS: 5.0000 mg | ORAL_TABLET | ORAL | Status: DC | PRN

## 2022-08-24 MED ORDER — ACETAMINOPHEN 325 MG PO TABS
650.0000 mg | ORAL_TABLET | Freq: Once | ORAL | Status: AC
  Administered 2022-08-24: 650 mg via ORAL
  Filled 2022-08-24: qty 2

## 2022-08-24 MED ORDER — SODIUM CHLORIDE 0.9 % IV SOLN
500.0000 mg | INTRAVENOUS | Status: AC
  Administered 2022-08-24: 500 mg via INTRAVENOUS
  Filled 2022-08-24: qty 5

## 2022-08-24 MED ORDER — UMECLIDINIUM BROMIDE 62.5 MCG/ACT IN AEPB
1.0000 | INHALATION_SPRAY | Freq: Every day | RESPIRATORY_TRACT | Status: DC
  Administered 2022-08-24 – 2022-08-29 (×6): 1 via RESPIRATORY_TRACT
  Filled 2022-08-24 (×2): qty 7

## 2022-08-24 MED ORDER — ASPIRIN 81 MG PO CHEW
324.0000 mg | CHEWABLE_TABLET | Freq: Once | ORAL | Status: AC
  Administered 2022-08-24: 324 mg via ORAL
  Filled 2022-08-24: qty 4

## 2022-08-24 MED ORDER — POLYETHYLENE GLYCOL 3350 17 G PO PACK: 17.0000 g | PACK | Freq: Every day | ORAL | Status: DC | PRN

## 2022-08-24 MED ORDER — AMLODIPINE BESYLATE 10 MG PO TABS
10.0000 mg | ORAL_TABLET | Freq: Every day | ORAL | Status: DC
  Administered 2022-08-24 – 2022-08-29 (×6): 10 mg via ORAL
  Filled 2022-08-24: qty 1
  Filled 2022-08-24: qty 2
  Filled 2022-08-24 (×4): qty 1

## 2022-08-24 MED ORDER — METHYLPREDNISOLONE SODIUM SUCC 125 MG IJ SOLR
60.0000 mg | Freq: Two times a day (BID) | INTRAMUSCULAR | Status: AC
  Administered 2022-08-24: 60 mg via INTRAVENOUS
  Filled 2022-08-24: qty 2

## 2022-08-24 MED ORDER — ONDANSETRON HCL 4 MG PO TABS: 4.0000 mg | ORAL_TABLET | Freq: Four times a day (QID) | ORAL | Status: DC | PRN

## 2022-08-24 MED ORDER — ZOLPIDEM TARTRATE 5 MG PO TABS
5.0000 mg | ORAL_TABLET | Freq: Every evening | ORAL | Status: DC | PRN
  Filled 2022-08-24: qty 1

## 2022-08-24 MED ORDER — MONTELUKAST SODIUM 10 MG PO TABS
10.0000 mg | ORAL_TABLET | Freq: Every day | ORAL | Status: DC
  Administered 2022-08-24 – 2022-08-28 (×5): 10 mg via ORAL
  Filled 2022-08-24 (×5): qty 1

## 2022-08-24 MED ORDER — SODIUM CHLORIDE 0.9% FLUSH
3.0000 mL | Freq: Two times a day (BID) | INTRAVENOUS | Status: DC
  Administered 2022-08-24 – 2022-08-29 (×11): 3 mL via INTRAVENOUS

## 2022-08-24 MED ORDER — APIXABAN 2.5 MG PO TABS
2.5000 mg | ORAL_TABLET | Freq: Two times a day (BID) | ORAL | Status: DC
  Administered 2022-08-24 – 2022-08-29 (×11): 2.5 mg via ORAL
  Filled 2022-08-24 (×11): qty 1

## 2022-08-24 MED ORDER — BUDESON-GLYCOPYRROL-FORMOTEROL 160-9-4.8 MCG/ACT IN AERO: 2.0000 | INHALATION_SPRAY | Freq: Two times a day (BID) | RESPIRATORY_TRACT | Status: DC

## 2022-08-24 MED ORDER — ACETAMINOPHEN 325 MG PO TABS
650.0000 mg | ORAL_TABLET | Freq: Four times a day (QID) | ORAL | Status: DC | PRN
  Administered 2022-08-26 – 2022-08-28 (×3): 650 mg via ORAL
  Filled 2022-08-24 (×3): qty 2

## 2022-08-24 MED ORDER — METOPROLOL SUCCINATE ER 25 MG PO TB24
25.0000 mg | ORAL_TABLET | Freq: Every day | ORAL | Status: DC
  Administered 2022-08-24 – 2022-08-29 (×6): 25 mg via ORAL
  Filled 2022-08-24 (×6): qty 1

## 2022-08-24 MED ORDER — PREDNISONE 20 MG PO TABS
40.0000 mg | ORAL_TABLET | Freq: Every day | ORAL | Status: AC
  Administered 2022-08-25 – 2022-08-28 (×4): 40 mg via ORAL
  Filled 2022-08-24 (×4): qty 2

## 2022-08-24 MED ORDER — FLUTICASONE FUROATE-VILANTEROL 200-25 MCG/ACT IN AEPB
1.0000 | INHALATION_SPRAY | Freq: Every day | RESPIRATORY_TRACT | Status: DC
  Administered 2022-08-24 – 2022-08-29 (×6): 1 via RESPIRATORY_TRACT
  Filled 2022-08-24: qty 28

## 2022-08-24 MED ORDER — APIXABAN 5 MG PO TABS: 5.0000 mg | ORAL_TABLET | Freq: Two times a day (BID) | ORAL | Status: DC

## 2022-08-24 MED ORDER — ALBUTEROL SULFATE (2.5 MG/3ML) 0.083% IN NEBU: 2.5000 mg | INHALATION_SOLUTION | RESPIRATORY_TRACT | Status: DC | PRN

## 2022-08-24 MED ORDER — BRIMONIDINE TARTRATE 0.2 % OP SOLN
1.0000 [drp] | Freq: Three times a day (TID) | OPHTHALMIC | Status: DC
  Administered 2022-08-24 – 2022-08-29 (×13): 1 [drp] via OPHTHALMIC
  Filled 2022-08-24: qty 5

## 2022-08-24 MED ORDER — GUAIFENESIN ER 600 MG PO TB12: 600.0000 mg | ORAL_TABLET | Freq: Two times a day (BID) | ORAL | Status: DC | PRN

## 2022-08-24 MED ORDER — PANTOPRAZOLE SODIUM 40 MG PO TBEC
40.0000 mg | DELAYED_RELEASE_TABLET | Freq: Every day | ORAL | Status: DC
  Administered 2022-08-24 – 2022-08-29 (×6): 40 mg via ORAL
  Filled 2022-08-24 (×6): qty 1

## 2022-08-24 MED ORDER — ONDANSETRON HCL 4 MG/2ML IJ SOLN
4.0000 mg | Freq: Once | INTRAMUSCULAR | Status: AC
  Administered 2022-08-24: 4 mg via INTRAVENOUS
  Filled 2022-08-24: qty 2

## 2022-08-24 MED ORDER — HYDRALAZINE HCL 20 MG/ML IJ SOLN: 5.0000 mg | INTRAMUSCULAR | Status: DC | PRN

## 2022-08-24 MED ORDER — FUROSEMIDE 10 MG/ML IJ SOLN
60.0000 mg | Freq: Once | INTRAMUSCULAR | Status: AC
  Administered 2022-08-24: 60 mg via INTRAVENOUS
  Filled 2022-08-24: qty 6

## 2022-08-24 MED ORDER — ESCITALOPRAM OXALATE 10 MG PO TABS
10.0000 mg | ORAL_TABLET | Freq: Every day | ORAL | Status: DC
  Administered 2022-08-24 – 2022-08-29 (×6): 10 mg via ORAL
  Filled 2022-08-24 (×6): qty 1

## 2022-08-24 MED ORDER — POLYVINYL ALCOHOL 1.4 % OP SOLN: 1.0000 [drp] | Freq: Every day | OPHTHALMIC | Status: DC | PRN

## 2022-08-24 MED ORDER — ACETAMINOPHEN 650 MG RE SUPP: 650.0000 mg | Freq: Four times a day (QID) | RECTAL | Status: DC | PRN

## 2022-08-24 MED ORDER — BISACODYL 5 MG PO TBEC
5.0000 mg | DELAYED_RELEASE_TABLET | Freq: Every day | ORAL | Status: DC | PRN
  Administered 2022-08-25 – 2022-08-29 (×2): 5 mg via ORAL
  Filled 2022-08-24 (×2): qty 1

## 2022-08-24 MED ORDER — METHYLPREDNISOLONE SODIUM SUCC 125 MG IJ SOLR
60.0000 mg | Freq: Two times a day (BID) | INTRAMUSCULAR | Status: AC
  Administered 2022-08-24: 60 mg via INTRAVENOUS
  Filled 2022-08-24 (×2): qty 2

## 2022-08-24 MED ORDER — PERFLUTREN LIPID MICROSPHERE
1.0000 mL | INTRAVENOUS | Status: AC | PRN
  Administered 2022-08-24: 2 mL via INTRAVENOUS

## 2022-08-24 MED ORDER — METHYLPREDNISOLONE SODIUM SUCC 125 MG IJ SOLR: 60.0000 mg | Freq: Two times a day (BID) | INTRAMUSCULAR | Status: DC

## 2022-08-24 MED ORDER — LACTATED RINGERS IV SOLN: INTRAVENOUS | Status: AC

## 2022-08-24 MED ORDER — ONDANSETRON HCL 4 MG/2ML IJ SOLN: 4.0000 mg | Freq: Four times a day (QID) | INTRAMUSCULAR | Status: DC | PRN

## 2022-08-24 MED ORDER — ROSUVASTATIN CALCIUM 20 MG PO TABS
40.0000 mg | ORAL_TABLET | Freq: Every day | ORAL | Status: DC
  Administered 2022-08-24 – 2022-08-29 (×6): 40 mg via ORAL
  Filled 2022-08-24 (×6): qty 2

## 2022-08-24 MED ORDER — BRINZOLAMIDE 1 % OP SUSP
1.0000 [drp] | Freq: Three times a day (TID) | OPHTHALMIC | Status: DC
  Administered 2022-08-24 – 2022-08-29 (×13): 1 [drp] via OPHTHALMIC
  Filled 2022-08-24: qty 10

## 2022-08-24 MED ORDER — AZITHROMYCIN 250 MG PO TABS
500.0000 mg | ORAL_TABLET | Freq: Every day | ORAL | Status: AC
  Administered 2022-08-25 – 2022-08-28 (×4): 500 mg via ORAL
  Filled 2022-08-24 (×4): qty 2

## 2022-08-24 MED ORDER — MELATONIN 5 MG PO TABS
20.0000 mg | ORAL_TABLET | Freq: Every day | ORAL | Status: DC
  Administered 2022-08-24 – 2022-08-28 (×5): 20 mg via ORAL
  Filled 2022-08-24 (×5): qty 4

## 2022-08-24 NOTE — ED Notes (Signed)
Patient readjusted in bed for comfort at this time. Patient and patient's daughter at bedside requesting patient's ordered eye drops. This RN explained that pharmacy has been contacted and we are waiting for them to be sent up to the ER at this time.

## 2022-08-24 NOTE — ED Notes (Signed)
Admitting provider Yates notified of patient's troponin result of 114. No complaints of chest pain from patient. NIBP, cardiac monitor and pulse ox remain in place.

## 2022-08-24 NOTE — ED Notes (Signed)
Patient transported to CT 

## 2022-08-24 NOTE — H&P (Signed)
History and Physical    Patient: Troy [name] [name] DOB: 05-Jun-1941 DOA: 08/24/2022 DOS: the patient was seen and examined on 08/24/2022 PCP: Binnie Rail, MD  Patient coming from: Home - lives alone, has caregivers; NOK: Daughter, Troy [name], [phone]   Chief Complaint: SOB  HPI: Troy Venuto [name] is a 81 y.o. male with medical history significant of afib on Eliquis; chronic diastolic CHF; stage 3 CKD; colon and lung cancer; COPD; glaucoma; HTN; HLD; PVD s/p stent; and OSA on CPAP presenting with SOB.  He reports that his oldest daughter made him some, had SOB and diarrhea, urinary incontinence.  Incontinence started with Lasix (30 BID -> 60 ->80).  SOB has worsened in the last 3 weeks.  Come cough, nonproductive.  Does not wear home O2, just CPAP at home.  Diarrhea started yesterday after lunch, several episodes but no longer present.  No chest pain.    ER Course:  Carryover, per Dr. Hal Hope:   81 year old male with history of hypertension, hyperlipidemia, COPD, lung cancer, diastolic heart failure, atrial fibrillation anticoagulated on apixaban, chronic kidney disease, colon cancer, peripheral vascular disease presents with a worsening shortness of breath with peripheral edema ER physician feels the symptoms are consistent with CHF exacerbation was given 60 mg IV Lasix admitted for CHF exacerbation.  Has been requiring oxygen which is new for the patient.     Review of Systems: As mentioned in the history of present illness. All other systems reviewed and are negative. Past Medical History:  Diagnosis Date   AAA (abdominal aortic aneurysm) ([place]) 10-20-17   MONITORED BY DR [name], LAST ABDOMINAL US 3.3 CM   Anxiety    Arthritis    Atrial fibrillation (HCC)    Basal cell carcinoma    BPH (benign prostatic hypertrophy)    Chronic diastolic heart failure (HCC)    Chronic kidney disease    stage 3, pt unaware   Colon cancer (HCC)    COPD  (chronic obstructive pulmonary disease) (HCC)    Depression    GERD (gastroesophageal reflux disease)    Glaucoma BOTH EYES   Dr Gershon [facility]   History of lung cancer APRIL 2012   ONCOLOGIST- DR Tressie Stalker  LOV IN [facility] 10-27-2012 SQUAMOUS CELL---- S/P RIGHT LOWER LOBECTOMY AT [facility] -- NO CHEMORADIATION--- NO RECURRENCE   Hx of adenomatous colonic polyps 2005    X 2; 1 hyperplastic polyp; Dr Olevia Perches   Hyperlipidemia    Hypertension    OSA (obstructive sleep apnea) 08/29/2015   CPAP SET ON 10   Peripheral vascular disease ([place])    FOLLOWED  BY DR DICKSON   Spinal stenosis 06/2014   Thoracic aorta atherosclerosis (HCC)    Thyrotoxicosis    amiodarone induced   Past Surgical History:  Procedure Laterality Date   ANGIO PLASTY     X 4 in legs   AORTOGRAM  07-27-2002   MILD DIFFUSE ILIAC ARTERY OCCLUSIVE DISEASE /  LEFT RENAL ARTERY 20%/ PATENT LEFT FEM-POP GRAFT/ MILD SFA AND POPLITEAL ARTERY OCCLUSIVE DISEASE W/ SEVERE KIDNEY OCCLUSIVE DISEASE   ATRIAL FIBRILLATION ABLATION N/A 04/09/2019   Procedure: ATRIAL FIBRILLATION ABLATION;  Surgeon: Thompson Grayer, MD;  Location: [place] [facility];  Service: Cardiovascular;  Laterality: N/A;   ATRIAL FIBRILLATION ABLATION N/A 02/12/2020   Procedure: ATRIAL FIBRILLATION ABLATION;  Surgeon: Thompson Grayer, MD;  Location: [place] [facility];  Service: Cardiovascular;  Laterality: N/A;   BALLOON DILATION N/A 02/06/2022   Procedure: BALLOON Tamponade ampulla;  Surgeon: Irving Copas., MD;  Location[name];  Service: Gastroenterology;  Laterality: N/A;   BASAL CELL CARCINOMA EXCISION     MULTIPLE TIMES--  RIGHT FOREARM, CHEEKS, AND BACK   BIOPSY  06/25/2018   Procedure: BIOPSY;  Surgeon: Rush Landmark Telford Nab., MD;  Location: [place];  Service: Gastroenterology;;   BIOPSY  02/06/2022   Procedure: BIOPSY;  Surgeon: Irving Copas., MD;  Location: [facility];  Service: Gastroenterology;;   CARDIOVASCULAR STRESS TEST  12-08-2012  DR  [facility]   NORMAL LEXISCAN WITH NO EXERCISE NUCLEAR STUDY/ EF 66%/   NO ISCHEMIA/ NO SIGNIFICANT CHANGE FROM PRIOR STUDY   CARDIOVERSION N/A 02/14/2018   Procedure: CARDIOVERSION;  Surgeon: Larey Dresser, MD;  Location: [facility] ENDOSCOPY;  Service: Cardiovascular;  Laterality: N/A;   CARDIOVERSION N/A 07/10/2022   Procedure: CARDIOVERSION;  Surgeon: Larey Dresser, MD;  Location: [employer];  Service: Cardiovascular;  Laterality: N/A;   CAROTID ANGIOGRAM N/A 07/10/2013   Procedure: CAROTID ANGIOGRAM;  Surgeon: Elam Dutch, MD;  Location: [facility] CATH LAB;  Service: Cardiovascular;  Laterality: N/A;   CAROTID ENDARTERECTOMY Right 07-14-13   cea   CATARACT EXTRACTION W/ INTRAOCULAR LENS  IMPLANT, BILATERAL     colon polyectomy     COLONOSCOPY     COLONOSCOPY WITH PROPOFOL N/A 06/25/2018   Procedure: COLONOSCOPY WITH PROPOFOL;  Surgeon: Rush Landmark Telford Nab., MD;  Location: [place];  Service: Gastroenterology;  Laterality: N/A;   CYSTOSCOPY W/ RETROGRADES Bilateral 01/21/2013   Procedure: CYSTOSCOPY WITH RETROGRADE PYELOGRAM;  Surgeon: Molli Hazard, MD;  Location: [name];  Service: Urology;  Laterality: Bilateral;   CYSTO, BLADDER BIOPSY, BILATERAL RETROGRADE PYELOGRAM  RAD TECH FROM RADIOLOGY PER JOY   CYSTOSCOPY WITH BIOPSY N/A 01/21/2013   Procedure: CYSTOSCOPY WITH BIOPSY;  Surgeon: Molli Hazard, MD;  Location: [facility];  Service: Urology;  Laterality: N/A;   ENDARTERECTOMY Right 07/14/2013   Procedure: ENDARTERECTOMY CAROTID;  Surgeon: Angelia Mould, MD;  Location: [facility] OR;  Service: Vascular;  Laterality: Right;   ENDOSCOPIC RETROGRADE CHOLANGIOPANCREATOGRAPHY (ERCP) WITH PROPOFOL N/A 02/06/2022   Procedure: ENDOSCOPIC RETROGRADE CHOLANGIOPANCREATOGRAPHY (ERCP) WITH PROPOFOL;  Surgeon: Irving Copas., MD;  Location: [name];  Service: Gastroenterology;  Laterality: N/A;   EP IMPLANTABLE DEVICE N/A 08/12/2015    Procedure: Loop Recorder Insertion;  Surgeon: Thompson Grayer, MD;  Location: [place] [facility];  Service: Cardiovascular;  Laterality: N/A;   ESOPHAGOGASTRODUODENOSCOPY Left 02/13/2022   Procedure: ESOPHAGOGASTRODUODENOSCOPY (EGD);  Surgeon: Lavena Bullion, DO;  Location: [facility] ENDOSCOPY;  Service: Gastroenterology;  Laterality: Left;   ESOPHAGOGASTRODUODENOSCOPY (EGD) WITH PROPOFOL N/A 06/25/2018   Procedure: ESOPHAGOGASTRODUODENOSCOPY (EGD) WITH PROPOFOL;  Surgeon: Rush Landmark Telford Nab., MD;  Location: [place];  Service: Gastroenterology;  Laterality: N/A;   ESOPHAGOGASTRODUODENOSCOPY (EGD) WITH PROPOFOL N/A 07/10/2018   Procedure: ESOPHAGOGASTRODUODENOSCOPY (EGD) WITH PROPOFOL;  Surgeon: Milus Banister, MD;  [facility];  Service: Endoscopy;  Laterality: N/A;   EUS N/A 07/10/2018   Procedure: UPPER ENDOSCOPIC ULTRASOUND (EUS) RADIAL;  Surgeon: Milus Banister, MD;  Location: [facility];  Service: Endoscopy;  Laterality: N/A;   EYE SURGERY Right    FEMORAL-POPLITEAL BYPASS GRAFT Left [place]   AND 2001  IN [place]   FEMORAL-POPLITEAL BYPASS GRAFT Left 08/30/2015   Procedure: REVISION OF BYPASS GRAFT Left  FEMORAL-POPLITEAL ARTERY;  Surgeon: Angelia Mould, MD;  Location: [place];  Service: Vascular;  Laterality: Left;   FINE NEEDLE ASPIRATION N/A 07/10/2018  Procedure: FINE NEEDLE ASPIRATION (FNA) LINEAR;  Surgeon: Milus Banister, MD;  [facility];  Service: Endoscopy;  Laterality: N/A;   FLEXIBLE SIGMOIDOSCOPY N/A 12/12/2018   Procedure: FLEXIBLE SIGMOIDOSCOPY;  Surgeon: Ileana Roup, MD;  [facility];  Service: General;  Laterality: N/A;   HEMOSTASIS CLIP PLACEMENT  02/13/2022   Procedure: HEMOSTASIS CLIP PLACEMENT;  Surgeon: Lavena Bullion, DO;  Location: [place];  Service: Gastroenterology;;   HOT HEMOSTASIS N/A 02/13/2022   Procedure: HOT HEMOSTASIS (ARGON PLASMA COAGULATION/BICAP);  Surgeon: Lavena Bullion, DO;   Location: [facility] ENDOSCOPY;  Service: Gastroenterology;  Laterality: N/A;   LARYNGOSCOPY  06-27-2004   BX VOCAL CORD  (LEUKOPLAKIA)  PER PT NO ISSUES SINCE   LEFT HEART CATH AND CORONARY ANGIOGRAPHY N/A 08/20/2017   Procedure: LEFT HEART CATH AND CORONARY ANGIOGRAPHY;  Surgeon: Larey Dresser, MD;  Location: [place] [facility];  Service: Cardiovascular;  Laterality: N/A;   LOOP RECORDER INSERTION N/A 04/09/2019   Procedure: LOOP RECORDER INSERTION;  Surgeon: Thompson Grayer, MD;  Location: [place] [facility];  Service: Cardiovascular;  Laterality: N/A;   LOOP RECORDER REMOVAL N/A 04/09/2019   Procedure: LOOP RECORDER REMOVAL;  Surgeon: Thompson Grayer, MD;  Location: [place] [facility];  Service: Cardiovascular;  Laterality: N/A;   LOWER EXTREMITY ANGIOGRAM Bilateral 08/29/2015   Procedure: Lower Extremity Angiogram;  Surgeon: Angelia Mould, MD;  Location: [place] [facility];  Service: Cardiovascular;  Laterality: Bilateral;   LUNG LOBECTOMY  01/24/2011    RIGHT UPPER LOBE  (SQUAMOUS CELL CARCINOMA) Dr Dorthea Cove , [facility]. No chemotherapyor radiation   PANCREATIC STENT PLACEMENT  02/06/2022   Procedure: PANCREATIC STENT PLACEMENT;  Surgeon: Rush Landmark Telford Nab., MD;  Location[name];  Service: Gastroenterology;;   [facility] ANGIOPLASTY Right 07/14/2013   Procedure: PATCH ANGIOPLASTY;  Surgeon: Angelia Mould, MD;  Location: [place];  Service: Vascular;  Laterality: Right;   PATCH ANGIOPLASTY Left 08/30/2015   Procedure: VEIN PATCH ANGIOPLASTY OF PROXIMAL Left BYPASS GRAFT;  Surgeon: Angelia Mould, MD;  Location: [place];  Service: Vascular;  Laterality: Left;   PERIPHERAL VASCULAR CATHETERIZATION N/A 08/29/2015   Procedure: Abdominal Aortogram;  Surgeon: Angelia Mould, MD;  Location: [place] [facility];  Service: Cardiovascular;  Laterality: N/A;   POLYPECTOMY  06/25/2018   Procedure: POLYPECTOMY;  Surgeon: Rush Landmark Telford Nab., MD;  Location: [place];  Service:  Gastroenterology;;   POLYPECTOMY     POLYPECTOMY  02/13/2022   Procedure: POLYPECTOMY;  Surgeon: Lavena Bullion, DO;  Location: [place];  Service: Gastroenterology;;   REMOVAL OF STONES  02/06/2022   Procedure: REMOVAL OF STONES;  Surgeon: Irving Copas., MD;  Location[name];  Service: Gastroenterology;;   Clide Deutscher  02/13/2022   Procedure: Clide Deutscher;  Surgeon: Lavena Bullion, DO;  Location: [place];  Service: Gastroenterology;;   Joan Mayans  02/06/2022   Procedure: Joan Mayans;  Surgeon: Irving Copas., MD;  Location: [name];  Service: Gastroenterology;;   SUBMUCOSAL INJECTION  06/25/2018   Procedure: SUBMUCOSAL INJECTION;  Surgeon: Irving Copas., MD;  Location: [place];  Service: Gastroenterology;;   TEE WITHOUT CARDIOVERSION N/A 02/14/2018   Procedure: TRANSESOPHAGEAL ECHOCARDIOGRAM (TEE);  Surgeon: Larey Dresser, MD;  Location: [facility] ENDOSCOPY;  Service: Cardiovascular;  Laterality: N/A;   trabecular surgery     OS   TRANSTHORACIC ECHOCARDIOGRAM  12-29-2012  DR [facility]   MILD LVH/  LVSF NORMAL/ EF 16-38%/  GRADE I DIASTOLIC DYSFUNCTION   Social History:  reports that he quit smoking about 8 years ago. His smoking use included cigarettes. He has a 25.00 pack-year smoking history. He has never used smokeless tobacco. He reports current alcohol use of about 4.0 standard drinks of alcohol per week. He reports that he does not use drugs.  Allergies  Allergen Reactions   Niacin-Lovastatin Er Shortness Of Breath and Other (See Comments)    Dyspnea Flushing   Penicillins Hives, Shortness Of Breath and Other (See Comments)    Dyspnea Flushing Because of a history of documented adverse serious drug reaction;Medi Alert bracelet  is recommended PCN reaction causing immediate rash, facial/tongue/throat swelling, SOB or lightheadedness with hypotension: Yes PCN reaction causing severe rash involving mucus membranes or skin  necrosis: No PCN reaction occurring within the last 10 years: NO PCN reaction that required hospitalization: NO   Sulfonamide Derivatives Hives, Shortness Of Breath and Other (See Comments)    Dyspnea  Flushing Because of a history of documented adverse serious drug reaction;Medi Alert bracelet  is recommended   Atorvastatin Other (See Comments)    Myalgias Arthralgias     Family History  Problem Relation Age of Onset   Stroke Mother        mini strokes   Alcohol abuse Father    Heart disease Father        MI after 59   Stroke Father    Hypertension Father    Heart attack Father    Heart disease Paternal Aunt        several   Hypertension Paternal Aunt        several   Stroke Paternal Aunt        several   Stroke Paternal Uncle        several   Heart disease Paternal Uncle        several;2 had MI pre 4   Cancer Daughter 107       breast ca, also with benign sessile polyp    Colon cancer Daughter 45   Colon polyps Neg Hx    Esophageal cancer Neg Hx    Rectal cancer Neg Hx    Stomach cancer Neg Hx     Prior to Admission medications   Medication Sig Start Date End Date Taking? Authorizing Provider  acetaminophen (TYLENOL) 325 MG tablet Take 650 mg by mouth every 6 (six) hours as needed for moderate pain or headache.   Yes [provider]  albuterol (VENTOLIN HFA) 108 (90 Base) MCG/ACT inhaler Inhale 2 puffs into the lungs every 6 (six) hours as needed for wheezing or shortness of breath. 07/06/21  Yes Ghimire, Henreitta Leber, MD  amLODipine (NORVASC) 10 MG tablet Take 1 tablet (10 mg total) by mouth daily. 08/14/22   Larey Dresser, MD  Brinzolamide-Brimonidine [facility]) 1-0.2 % SUSP Place 1 drop into both eyes 3 (three) times daily.    [provider]  Budeson-Glycopyrrol-Formoterol (BREZTRI AEROSPHERE) 160-9-4.8 MCG/ACT AERO Inhale 2 puffs into the lungs in the morning and at bedtime. Patient taking differently: Inhale 2 puffs into the lungs daily.  07/27/21   Icard, Octavio Graves, DO  docusate sodium (COLACE) 100 MG capsule Take 100 mg by mouth 2 (two) times daily.     [provider]  ELIQUIS 5 MG TABS tablet TAKE ONE TABLET BY MOUTH TWICE DAILY 08/20/22   Baldwin Jamaica, PA-C  escitalopram (LEXAPRO) 10 MG tablet TAKE ONE TABLET BY MOUTH EVERY DAY 08/20/22   Binnie Rail, MD  furosemide (LASIX) 20  MG tablet Take 3 tablets (60 mg total) by mouth 2 (two) times daily. 07/10/22   Larey Dresser, MD  JARDIANCE 10 MG TABS tablet TAKE ONE TABLET BY MOUTH EVERY DAY BEFORE BREAKFAST. 01/08/22   Larey Dresser, MD  Melatonin 10 MG TABS Take 20 mg by mouth at bedtime.    [provider]  metoprolol succinate (TOPROL-XL) 25 MG 24 hr tablet Take 1 tablet (25 mg total) by mouth daily. 07/31/22   [place]ford, Maricela Bo, FNP  montelukast (SINGULAIR) 10 MG tablet TAKE ONE TABLET BY MOUTH AT BEDTIME 08/20/22   Binnie Rail, MD  Multiple Vitamins-Minerals (CENTRUM SILVER PO) Take 1 tablet by mouth daily.    [provider]  omeprazole (PRILOSEC) 20 MG capsule Take 1 capsule (20 mg total) by mouth 2 (two) times daily. 05/07/22   Mansouraty, Telford Nab., MD  ondansetron (ZOFRAN) 4 MG tablet Take 1 tablet (4 mg total) by mouth every 6 (six) hours as needed for nausea. 02/16/22   Sheikh, Omair Latif, DO  OVER THE COUNTER MEDICATION Take 3 capsules by mouth daily. Balance of Yahoo, Historical, MD  Polyethyl Glycol-Propyl Glycol (SYSTANE ULTRA) 0.4-0.3 % SOLN Place 1 drop into both eyes daily as needed (dry eyes).    [provider]  potassium chloride SA (KLOR-CON M) 20 MEQ tablet TAKE TWO TABLETS BY MOUTH IN THE MORNING AND TAKE ONE TABLET IN THE EVENING 05/10/22   Larey Dresser, MD  rosuvastatin (CRESTOR) 40 MG tablet TAKE ONE TABLET BY MOUTH EVERY DAY 02/09/22   Larey Dresser, MD  senna (SENOKOT) 8.6 MG tablet Take 1 tablet by mouth 2 (two) times daily.    [provider]  vitamin B-12  (CYANOCOBALAMIN) 1000 MCG tablet Take 1 tablet (1,000 mcg total) by mouth daily. 02/16/22   Kerney Elbe, DO    Physical Exam: Vitals:   08/24/22 1230 08/24/22 1300 08/24/22 1301 08/24/22 1303  BP: (!) 150/66 (!) 161/68    Pulse: 82 76 82   Resp: 18 (!) 28 (!) 23   Temp:    98.3 F (36.8 C)  TempSrc:    Oral  SpO2: 96% 93% 94%   Weight:      Height:       General:  Appears calm and comfortable and is in NAD Eyes:  chronic visual impairment ENT:  grossly normal hearing, lips & tongue, mmm Neck:  no LAD, masses or thyromegaly Cardiovascular:  RR with mild tachycardia, no m/r/g. No LE edema.  Respiratory:   Mild diffuse wheezing with reasonable air movement.  Mildly increased respiratory effort. Abdomen:  soft, NT, ND Skin:  no rash or induration seen on limited exam Musculoskeletal:  grossly normal tone BUE/BLE, good ROM, no bony abnormality Psychiatric:  grossly normal mood and affect, speech fluent and appropriate, AOx3 Neurologic:  CN 2-12 grossly intact, poor eye contact, moves all extremities in coordinated fashion   Radiological Exams on Admission: Independently reviewed - see discussion in A/P where applicable  DG Chest Port 1 View  Result Date: 08/24/2022 CLINICAL DATA:  Shortness of breath EXAM: PORTABLE CHEST 1 VIEW COMPARISON:  08/14/2022 FINDINGS: Chronic right pleural effusion/scarring. Stable linear opacities right lung base. The left lung is clear. Unchanged cardiac and mediastinal contours. Subcutaneous moderate device at the left aspect of the cardiac silhouette. No acute osseous abnormality. IMPRESSION: Chronic right pleural effusion/scarring. No acute cardiopulmonary process. Electronically Signed   By: Francetta Found.Troy.  On: 08/24/2022 02:22    EKG: Independently reviewed.  Sinus tachycardia with rate 107; RBBB; nonspecific ST changes    Labs on Admission: I have personally reviewed the available labs and imaging studies at the time of the  admission.  Pertinent labs:    CO2 17 Glucose 184 BUN 26/Creatinine 1.95/GFR 34; 18/1.41/50 on 11/14 Anion gap 19 BNP 256.7 HS troponin 20, 38 Unremarkable CBC UA: >500 glucose, 5 ketones, 30 protein   Assessment and Plan: Active Problems:   Chronic renal disease, stage 3, moderately decreased glomerular filtration rate between 30-59 mL/min/1.73 square meter (HCC)   Glaucoma   PAF (paroxysmal atrial fibrillation) (HCC)   OSA (obstructive sleep apnea)   Benign essential HTN   Chronic diastolic CHF (congestive heart failure) (HCC)   AKI (acute kidney injury) ([place])   COPD with acute exacerbation (HCC)   Hyperlipidemia   DNR (do not resuscitate)    Acute on chronic respiratory failure associated with a COPD exacerbation -Patient's shortness of breath and cough are most likely caused by acute COPD exacerbation.  -He has history of COPD but does not wear home O2; O2 sats in the ER were as low as 86% -He does not have fever and or leukocytosis.  -Chest x-ray is not consistent with pneumonia -will observe for now and attempt to wean off [place] O2 if possible -Nebulizers: scheduled Duoneb and prn albuterol -Solu-Medrol 80 mg IV BID -> Prednisone 40 mg PO daily -IV Azithromycin  -Continue Breztri, Singulair -Coordinated care with TOC team/PT/OT/Nutrition/RT consults  AKI on stage 3a CKD -Patient with baseline compromised renal function, stage 3a CKD  -Current creatinine is increased at least 1.5 times compared to baseline and presumed to have occurred within the last 7 days -Likely due to prerenal secondary to dehydration in the setting of overdiuresis and diarrhea yesterday (resolved) -IVF -Follow up renal function by BMP -Avoid ACEI and NSAIDs  Chronic diastolic CHF -Hold Lasix and Jardiance for now -Will need to consider what dose to resume Lasix at when time, as patient reports he has been having incontinence and feels that the increasing doses of Lasix have been too  much -Troponin is uptrending, which is likely associated with demand ischemia; cardiology has been asked to consult  Afib -Rate controlled with metoprolol -Continue Eliquis    HTN -Hold losartan -Continue metprolol, amlodipine   HLD -Continue Crestor   DM -Recent A1c was 6.8, indicating good control -hold Jardiance -Cover with moderate-scale SSI    Glaucoma -Advanced visual impairment -Continue latanoprost, Simbrinza   AAA -Larger than prior in March 2023 -Due for annual f/u with vascular again next March   Mood Troy/o -Continue Lexapro, melatonin  OSA -Continue CPAP   DNR -I have discussed code status with the patient and his daughter and  they are in agreement that the patient would not desire resuscitation and would prefer to die a natural death should that situation arise. -He will need a gold out of facility DNR form at the time of discharge      Advance Care Planning:   Code Status: DNR   Consults: TOC team; nutrition; PT/OT  DVT Prophylaxis: Eliquis  Family Communication: None present at the time of the evaluation but his daughter came in shortly thereafter  Severity of Illness: The appropriate patient status for this patient is OBSERVATION. Observation status is judged to be reasonable and necessary in order to provide the required intensity of service to ensure the patient's safety. The patient's presenting symptoms, physical exam  findings, and initial radiographic and laboratory data in the context of their medical condition is felt to place them at decreased risk for further clinical deterioration. Furthermore, it is anticipated that the patient will be medically stable for discharge from the hospital within 2 midnights of admission.   Author: Karmen Bongo, MD 08/24/2022 1:36 PM  For on call review [email or web address].

## 2022-08-24 NOTE — ED Notes (Signed)
Cardiology at bedside to evaluate patient and update him and family on plan pf care.

## 2022-08-24 NOTE — ED Triage Notes (Signed)
Pt via EMS from home, daughter called EMS for n/v since lunch time with SOB. H/A X 3 wks

## 2022-08-24 NOTE — Consult Note (Signed)
Cardiology Consultation   Patient ID: Troy Kernodle [name] MRN: [id number]; DOB: [birth year]  Admit date: 08/24/2022 Date of Consult: 08/24/2022  PCP:  Binnie Rail, MD   [place] Providers Cardiologist:  [name], MD  Electrophysiologist:  Thompson Grayer, MD  Advanced Heart Failure:  [name], MD       Patient Profile:   Troy Smithson [name] is a 81 y.o. male with a hx of HTN, HLD, AFib s/p ablation on Eliquis, HFpEF, CKD III, BPH, Left femoral to below knee popliteal artery bypass in [place] (1994). Revision of bypass with a left popliteal tibial endarterectomy and extension of the bypass using a reversed basilic arm leg. 08/30/2015: Vein patch angioplasty of proximal left femoral to popliteal bypass graft using left GSV, R-CEA, COPD, hx tob use (now quit), lung CA 2012, non-isch MV 2021  He is being seen 08/24/2022 for the evaluation of elevated trop at the request of Dr Lorin Mercy.  History of Present Illness:   Mr. [name] was seen by Dr Aundra Dubin on 11/14. C/o HA, BP was above normal, balance issues but no falls. Wt 191 lbs  Assessment/Plan: 1. Chronic diastolic CHF: Echo in 42/35 with EF 55-60%, mild LVH, mildly dysfunctional RV.  Chronic NYHA class III symptoms.  Exertional dyspnea seems worse but weight is down and REDS clip not elevated at 26%.  He does not look particularly volume overloaded on exam.  - Continue Lasix 60 mg bid. BMET and BNP today. - Continue KCl 40 qam/20 qpm.  - Continue Jardiance 10 mg daily. No GU symptoms. - I will get CXR today given dyspnea but no definite evidence for CHF/volume overload.  2. Hyperlipidemia: 1/23 lipids with good LDL. - Continue Crestor 40 mg daily. Check lipids next appt. - Continue Vascepa 2 g bid. 3. Carotid stenosis: s/p R CEA.  Dopplers followed at [facility].  4. PAD: s/p patch angioplasty to left fem-pop bypass in 11/16.  Followed at [facility].  Peripheral arterial dopplers in 3/23 with >50% right iliac stenosis,  but no rest pain or pedal ulcerations.  Rare mild claudication.  - Follows with [facility].  5. AAA: Stable on last [name], followed at [facility].   6. COPD: No longer smoking. PFTs in 1/18 showed moderately severe obstructive defect. CT chest in 11/18 showed emphysema.  COPD contributes to his dyspnea.  Oxygen saturation 95% on RA today.  - Suspect this is a significant contributor to his dyspnea.  He is going to call for pulmonary followup.  - CXR today.   7. Atrial fibrillation: S/p atrial fibrillation ablation in 7/20 and redo in 5/21.  He has been primarily in NSR since redo ablation.  Back in slow atrial fibrillation 9/23 and s/p DCCV 10/23 to NSR. In NSR on ECG today.  He saw EP recently, poor candidate for redo ablation.  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 GFR would limit Tikosyn to the lowest dose, probably not a good choice for him.  - Continue Toprol XL 25 mg daily.  - Continue Eliquis 2.5 mg bid. (Reduced dose with creatinine and age). - Continue to limit ETOH.  - Continue to use CPAP.  8. CKD: Stage 3.  BMET today.  9. CAD: LHC in 11/18 with nonobstructive disease only.  Cardiolite in 6/21 with no ischemia/infarction. No chest pain.  10. 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  He is no longer on methimazole, follows with endocrinology.     11. Colon cancer: Sigmoid adenocarcinoma,  s/p surgical resection. In remission.  12. HTN: Elevated off losartan. - Increase amlodipine to 10 mg daily.  13. Pleural effusion: Chronic right pleural effusion, s/p right lower lobectomy.   14. Squamous cell lung cancer: S/p right lower lobectomy.  Follows with Dr. Valeta Harms.  15. Upper GI bleeding: In 5/23 due to duodenal ulcer. No abnormal bleeding. - Continue PPI.  - Check CBC.  16. Neurology: Patient has had a headache on and off for about 2 wks, this is new.  BP is also quite high.  He additionally has noted problems with his balance  though he has not fallen.  - I will order CT head w/o contrast to assess for evidence of CVA. (Not done yet) - Control BP as above, may be contributing to headache.   BUN 18, creatinine 1.41- that day        Pt came to the ER today with N&V, diarrhea yesterday, HA, prod cough w/ sputum color unknown, weakness, L flank pain.  EMS gave Duoneb x 2, 2 gm Mg and Solumedrol 125 mg.   Still requiring O2, Lasix 60 mg IV given. Not on O2 at home  Trop trending up, Cards asked to see.   [name] has not had any chest pain.  He does not know if he has had any atrial fibrillation, he has not had any palpitations, but then he usually does not.  He feels that he has been drinking adequate liquids, but notes that he increased his water intake in the last few days.  The nausea, vomiting, diarrhea started yesterday.  He had about 3 episodes of diarrhea and then it resolved.  He has had very little to eat and drink since then.  He threw up all of his medications yesterday, so has not had his usual meds in over 24 hours.  Right now, his headache is still severe, [name] was going to do a CT, but the office never called him to schedule.  With the medications he has had in the emergency room, he was feeling a little bit more relaxed, no nausea, still has this severe headache.  He still requires O2, but was not on O2 prior to admission.   Past Medical History:  Diagnosis Date   AAA (abdominal aortic aneurysm) ([place]) 10-20-17   MONITORED BY DR [name], 3.7 CM by ABDOMINAL US 11/2021   Anxiety    Arthritis    Atrial fibrillation (HCC)    Basal cell carcinoma    BPH (benign prostatic hypertrophy)    Chronic diastolic heart failure (HCC)    Chronic kidney disease    stage 3, pt unaware   Colon cancer (HCC)    COPD (chronic obstructive pulmonary disease) (HCC)    Depression    GERD (gastroesophageal reflux disease)    Glaucoma BOTH EYES   Dr Gershon Crane   History of lung cancer APRIL 2012    ONCOLOGIST- DR Tressie Stalker  LOV IN [facility] 10-27-2012 SQUAMOUS CELL---- S/P RIGHT LOWER LOBECTOMY AT [facility] -- NO CHEMORADIATION--- NO RECURRENCE   Hx of adenomatous colonic polyps 2005    X 2; 1 hyperplastic polyp; Dr Olevia Perches   Hyperlipidemia    Hypertension    OSA (obstructive sleep apnea) 08/29/2015   CPAP SET ON 10   Peripheral vascular disease ([place])    FOLLOWED  BY DR DICKSON   Spinal stenosis 06/2014   Thoracic aorta atherosclerosis (HCC)    Thyrotoxicosis    amiodarone induced    Past Surgical History:  Procedure  Laterality Date   ANGIO PLASTY     X 4 in legs   AORTOGRAM  07-27-2002   MILD DIFFUSE ILIAC ARTERY OCCLUSIVE DISEASE /  LEFT RENAL ARTERY 20%/ PATENT LEFT FEM-POP GRAFT/ MILD SFA AND POPLITEAL ARTERY OCCLUSIVE DISEASE W/ SEVERE KIDNEY OCCLUSIVE DISEASE   ATRIAL FIBRILLATION ABLATION N/A 04/09/2019   Procedure: ATRIAL FIBRILLATION ABLATION;  Surgeon: Thompson Grayer, MD;  Location: [place] [facility];  Service: Cardiovascular;  Laterality: N/A;   ATRIAL FIBRILLATION ABLATION N/A 02/12/2020   Procedure: ATRIAL FIBRILLATION ABLATION;  Surgeon: Thompson Grayer, MD;  Location: [place] [facility];  Service: Cardiovascular;  Laterality: N/A;   BALLOON DILATION N/A 02/06/2022   Procedure: BALLOON Tamponade ampulla;  Surgeon: Irving Copas., MD;  Location[name];  Service: Gastroenterology;  Laterality: N/A;   BASAL CELL CARCINOMA EXCISION     MULTIPLE TIMES--  RIGHT FOREARM, CHEEKS, AND BACK   BIOPSY  06/25/2018   Procedure: BIOPSY;  Surgeon: Rush Landmark Telford Nab., MD;  Location: [place];  Service: Gastroenterology;;   BIOPSY  02/06/2022   Procedure: BIOPSY;  Surgeon: Irving Copas., MD;  [facility]: [facility];  Service: Gastroenterology;;   CARDIOVASCULAR STRESS TEST  12-08-2012  DR [facility]   NORMAL LEXISCAN WITH NO EXERCISE NUCLEAR STUDY/ EF 66%/   NO ISCHEMIA/ NO SIGNIFICANT CHANGE FROM PRIOR STUDY   CARDIOVERSION N/A 02/14/2018   Procedure: CARDIOVERSION;   Surgeon: Larey Dresser, MD;  Location: [facility] ENDOSCOPY;  Service: Cardiovascular;  Laterality: N/A;   CARDIOVERSION N/A 07/10/2022   Procedure: CARDIOVERSION;  Surgeon: Larey Dresser, MD;  Location: [place];  Service: Cardiovascular;  Laterality: N/A;   CAROTID ANGIOGRAM N/A 07/10/2013   Procedure: CAROTID ANGIOGRAM;  Surgeon: Elam Dutch, MD;  Location: [facility] CATH LAB;  Service: Cardiovascular;  Laterality: N/A;   CAROTID ENDARTERECTOMY Right 07-14-13   cea   CATARACT EXTRACTION W/ INTRAOCULAR LENS  IMPLANT, BILATERAL     colon polyectomy     COLONOSCOPY     COLONOSCOPY WITH PROPOFOL N/A 06/25/2018   Procedure: COLONOSCOPY WITH PROPOFOL;  Surgeon: Rush Landmark Telford Nab., MD;  Location: [place];  Service: Gastroenterology;  Laterality: N/A;   CYSTOSCOPY W/ RETROGRADES Bilateral 01/21/2013   Procedure: CYSTOSCOPY WITH RETROGRADE PYELOGRAM;  Surgeon: Molli Hazard, MD;  Location: [facility];  Service: Urology;  Laterality: Bilateral;   CYSTO, BLADDER BIOPSY, BILATERAL RETROGRADE PYELOGRAM  RAD TECH FROM RADIOLOGY PER JOY   CYSTOSCOPY WITH BIOPSY N/A 01/21/2013   Procedure: CYSTOSCOPY WITH BIOPSY;  Surgeon: Molli Hazard, MD;  Location: [facility];  Service: Urology;  Laterality: N/A;   ENDARTERECTOMY Right 07/14/2013   Procedure: ENDARTERECTOMY CAROTID;  Surgeon: Angelia Mould, MD;  Location: [facility] OR;  Service: Vascular;  Laterality: Right;   ENDOSCOPIC RETROGRADE CHOLANGIOPANCREATOGRAPHY (ERCP) WITH PROPOFOL N/A 02/06/2022   Procedure: ENDOSCOPIC RETROGRADE CHOLANGIOPANCREATOGRAPHY (ERCP) WITH PROPOFOL;  Surgeon: Irving Copas., MD;  Location: [name];  Service: Gastroenterology;  Laterality: N/A;   EP IMPLANTABLE DEVICE N/A 08/12/2015   Procedure: Loop Recorder Insertion;  Surgeon: Thompson Grayer, MD;  Location: [place] CV LAB;  Service: Cardiovascular;  Laterality: N/A;   ESOPHAGOGASTRODUODENOSCOPY Left  02/13/2022   Procedure: ESOPHAGOGASTRODUODENOSCOPY (EGD);  Surgeon: Lavena Bullion, DO;  Location: [facility] ENDOSCOPY;  Service: Gastroenterology;  Laterality: Left;   ESOPHAGOGASTRODUODENOSCOPY (EGD) WITH PROPOFOL N/A 06/25/2018   Procedure: ESOPHAGOGASTRODUODENOSCOPY (EGD) WITH PROPOFOL;  Surgeon: Rush Landmark Telford Nab., MD;  Location: [place];  Service: Gastroenterology;  Laterality: N/A;   ESOPHAGOGASTRODUODENOSCOPY (  EGD) WITH PROPOFOL N/A 07/10/2018   Procedure: ESOPHAGOGASTRODUODENOSCOPY (EGD) WITH PROPOFOL;  Surgeon: Milus Banister, MD;  [facility];  Service: Endoscopy;  Laterality: N/A;   EUS N/A 07/10/2018   Procedure: UPPER ENDOSCOPIC ULTRASOUND (EUS) RADIAL;  Surgeon: Milus Banister, MD;  Location: [facility];  Service: Endoscopy;  Laterality: N/A;   EYE SURGERY Right    FEMORAL-POPLITEAL BYPASS GRAFT Left [place]   AND 2001  IN [place]   FEMORAL-POPLITEAL BYPASS GRAFT Left 08/30/2015   Procedure: REVISION OF BYPASS GRAFT Left  FEMORAL-POPLITEAL ARTERY;  Surgeon: Angelia Mould, MD;  Location: [place];  Service: Vascular;  Laterality: Left;   FINE NEEDLE ASPIRATION N/A 07/10/2018   Procedure: FINE NEEDLE ASPIRATION (FNA) LINEAR;  Surgeon: Milus Banister, MD;  [facility];  Service: Endoscopy;  Laterality: N/A;   FLEXIBLE SIGMOIDOSCOPY N/A 12/12/2018   Procedure: FLEXIBLE SIGMOIDOSCOPY;  Surgeon: Ileana Roup, MD;  [facility];  Service: General;  Laterality: N/A;   HEMOSTASIS CLIP PLACEMENT  02/13/2022   Procedure: HEMOSTASIS CLIP PLACEMENT;  Surgeon: Lavena Bullion, DO;  Location: [place];  Service: Gastroenterology;;   HOT HEMOSTASIS N/A 02/13/2022   Procedure: HOT HEMOSTASIS (ARGON PLASMA COAGULATION/BICAP);  Surgeon: Lavena Bullion, DO;  Location: [facility] ENDOSCOPY;  Service: Gastroenterology;  Laterality: N/A;   LARYNGOSCOPY  06-27-2004   BX VOCAL CORD  (LEUKOPLAKIA)  PER PT NO ISSUES SINCE   LEFT HEART CATH AND  CORONARY ANGIOGRAPHY N/A 08/20/2017   Procedure: LEFT HEART CATH AND CORONARY ANGIOGRAPHY;  Surgeon: Larey Dresser, MD;  Location: [place] CV LAB;  Service: Cardiovascular;  Laterality: N/A;   LOOP RECORDER INSERTION N/A 04/09/2019   Procedure: LOOP RECORDER INSERTION;  Surgeon: Thompson Grayer, MD;  Location: [place] [facility];  Service: Cardiovascular;  Laterality: N/A;   LOOP RECORDER REMOVAL N/A 04/09/2019   Procedure: LOOP RECORDER REMOVAL;  Surgeon: Thompson Grayer, MD;  Location: [place] CV [facility];  Service: Cardiovascular;  Laterality: N/A;   LOWER EXTREMITY ANGIOGRAM Bilateral 08/29/2015   Procedure: Lower Extremity Angiogram;  Surgeon: Angelia Mould, MD;  Location: [place] [facility];  Service: Cardiovascular;  Laterality: Bilateral;   LUNG LOBECTOMY  01/24/2011    RIGHT UPPER LOBE  (SQUAMOUS CELL CARCINOMA) Dr Dorthea Cove , [facility]. No chemotherapyor radiation   PANCREATIC STENT PLACEMENT  02/06/2022   Procedure: PANCREATIC STENT PLACEMENT;  Surgeon: Rush Landmark Telford Nab., MD;  Location[name];  Service: Gastroenterology;;   [facility] ANGIOPLASTY Right 07/14/2013   Procedure: PATCH ANGIOPLASTY;  Surgeon: Angelia Mould, MD;  Location: [place];  Service: Vascular;  Laterality: Right;   PATCH ANGIOPLASTY Left 08/30/2015   Procedure: VEIN PATCH ANGIOPLASTY OF PROXIMAL Left BYPASS GRAFT;  Surgeon: Angelia Mould, MD;  Location: [place];  Service: Vascular;  Laterality: Left;   PERIPHERAL VASCULAR CATHETERIZATION N/A 08/29/2015   Procedure: Abdominal Aortogram;  Surgeon: Angelia Mould, MD;  Location: [place] CV LAB;  Service: Cardiovascular;  Laterality: N/A;   POLYPECTOMY  06/25/2018   Procedure: POLYPECTOMY;  Surgeon: Rush Landmark Telford Nab., MD;  Location: [place];  Service: Gastroenterology;;   POLYPECTOMY     POLYPECTOMY  02/13/2022   Procedure: POLYPECTOMY;  Surgeon: Lavena Bullion, DO;  Location: [place] ENDOSCOPY;  Service: Gastroenterology;;   REMOVAL  OF STONES  02/06/2022   Procedure: REMOVAL OF STONES;  Surgeon: Irving Copas., MD;  Location[name];  Service: Gastroenterology;;   Clide Deutscher  02/13/2022   Procedure: SCLEROTHERAPY;  Surgeon: Lavena Bullion, DO;  Location: [facility] ENDOSCOPY;  Service: Gastroenterology;;   Joan Mayans  02/06/2022   Procedure: Joan Mayans;  Surgeon: Irving Copas., MD;  Location[name];  Service: Gastroenterology;;   Lia Foyer INJECTION  06/25/2018   Procedure: SUBMUCOSAL INJECTION;  Surgeon: Irving Copas., MD;  Location: [place];  Service: Gastroenterology;;   TEE WITHOUT CARDIOVERSION N/A 02/14/2018   Procedure: TRANSESOPHAGEAL ECHOCARDIOGRAM (TEE);  Surgeon: Larey Dresser, MD;  Location[facility] ENDOSCOPY;  Service: Cardiovascular;  Laterality: N/A;   trabecular surgery     OS   TRANSTHORACIC ECHOCARDIOGRAM  12-29-2012  DR [facility]   MILD LVH/  LVSF NORMAL/ EF 86-75%/  GRADE I DIASTOLIC DYSFUNCTION     Home Medications:  Prior to Admission medications   Medication Sig Start Date End Date Taking? Authorizing Provider  acetaminophen (TYLENOL) 325 MG tablet Take 650 mg by mouth every 6 (six) hours as needed for moderate pain or headache.   Yes [provider]  albuterol (VENTOLIN HFA) 108 (90 Base) MCG/ACT inhaler Inhale 2 puffs into the lungs every 6 (six) hours as needed for wheezing or shortness of breath. 07/06/21  Yes Ghimire, Henreitta Leber, MD  amLODipine (NORVASC) 10 MG tablet Take 1 tablet (10 mg total) by mouth daily. 08/14/22  Yes Larey Dresser, MD  Brinzolamide-Brimonidine [facility]) 1-0.2 % SUSP Place 1 drop into both eyes 3 (three) times daily.   Yes [provider]  Budeson-Glycopyrrol-Formoterol (BREZTRI AEROSPHERE) 160-9-4.8 MCG/ACT AERO Inhale 2 puffs into the lungs in the morning and at bedtime. Patient taking differently: Inhale 2 puffs into the lungs daily. 07/27/21  Yes Icard, Octavio Graves, DO  docusate sodium (COLACE) 100 MG  capsule Take 100 mg by mouth 2 (two) times daily as needed (constipation).   Yes [provider]  ELIQUIS 5 MG TABS tablet TAKE ONE TABLET BY MOUTH TWICE DAILY 08/20/22  Yes Baldwin Jamaica, PA-C  escitalopram (LEXAPRO) 10 MG tablet TAKE ONE TABLET BY MOUTH EVERY DAY Patient taking differently: Take 10 mg by mouth daily. 08/20/22  Yes Burns, Claudina Lick, MD  furosemide (LASIX) 20 MG tablet Take 3 tablets (60 mg total) by mouth 2 (two) times daily. Patient taking differently: Take 60-80 mg by mouth See admin instructions. 80 mg in the morning, 60 mg in the evening 07/10/22  Yes Larey Dresser, MD  JARDIANCE 10 MG TABS tablet TAKE ONE TABLET BY MOUTH EVERY DAY BEFORE BREAKFAST. Patient taking differently: Take 10 mg by mouth daily. 01/08/22  Yes Larey Dresser, MD  latanoprost (XALATAN) 0.005 % ophthalmic solution Place 1 drop into both eyes at bedtime.   Yes [provider]  losartan (COZAAR) 25 MG tablet Take 25 mg by mouth daily.   Yes [provider]  Melatonin 10 MG TABS Take 20 mg by mouth at bedtime.   Yes [provider]  metoprolol succinate (TOPROL-XL) 25 MG 24 hr tablet Take 1 tablet (25 mg total) by mouth daily. Patient taking differently: Take 25 mg by mouth at bedtime. 07/31/22  Yes Milford, Jessica M, FNP  montelukast (SINGULAIR) 10 MG tablet TAKE ONE TABLET BY MOUTH AT BEDTIME Patient taking differently: Take 10 mg by mouth at bedtime. 08/20/22  Yes Burns, Claudina Lick, MD  omeprazole (PRILOSEC) 20 MG capsule Take 1 capsule (20 mg total) by mouth 2 (two) times daily. 05/07/22  Yes Mansouraty, Telford Nab., MD  OVER THE COUNTER MEDICATION Take 3 capsules by mouth daily. Balance of [employer]   Yes  [provider]  OVER THE COUNTER MEDICATION Take 3 capsules by mouth daily. Balance of [employer]   Yes [provider]  Polyethyl Glycol-Propyl Glycol (SYSTANE ULTRA) 0.4-0.3 % SOLN Place 1 drop into both eyes daily as needed (dry eyes).    Yes [provider]  potassium chloride SA (KLOR-CON M) 20 MEQ tablet TAKE TWO TABLETS BY MOUTH IN THE MORNING AND TAKE ONE TABLET IN THE EVENING Patient taking differently: Take 20-40 mEq by mouth See admin instructions. 40 mEq in the morning, 20 mEq in the evening 05/10/22  Yes Larey Dresser, MD  Probiotic Product (DIGESTIVE ADVANTAGE) CAPS Take 1 capsule by mouth daily.   Yes [provider]  rosuvastatin (CRESTOR) 40 MG tablet TAKE ONE TABLET BY MOUTH EVERY DAY Patient taking differently: Take 40 mg by mouth daily. 02/09/22  Yes Larey Dresser, MD    Inpatient Medications: Scheduled Meds:  amLODipine  10 mg Oral Daily   apixaban  2.5 mg Oral BID   [START ON 08/25/2022] azithromycin  500 mg Oral Daily   brinzolamide  1 drop Both Eyes TID   And   brimonidine  1 drop Both Eyes TID   Budeson-Glycopyrrol-Formoterol  2 puff Inhalation BID   escitalopram  10 mg Oral Daily   latanoprost  1 drop Both Eyes QHS   melatonin  20 mg Oral QHS   methylPREDNISolone (SOLU-MEDROL) injection  60 mg Intravenous Q12H   Followed by   Derrill Memo ON 08/25/2022] predniSONE  40 mg Oral Q breakfast   metoprolol succinate  25 mg Oral Daily   montelukast  10 mg Oral QHS   pantoprazole  40 mg Oral Daily   rosuvastatin  40 mg Oral Daily   sodium chloride flush  3 mL Intravenous Q12H   Continuous Infusions:  lactated ringers 50 mL/hr at 08/24/22 0919   PRN Meds: acetaminophen **OR** acetaminophen, albuterol, bisacodyl, guaiFENesin, hydrALAZINE, ondansetron **OR** ondansetron (ZOFRAN) IV, oxyCODONE, polyethylene glycol, polyvinyl alcohol, zolpidem  Allergies:    Allergies  Allergen Reactions   Niacin-Lovastatin Er Shortness Of Breath and Other (See Comments)    Dyspnea Flushing   Penicillins Hives, Shortness Of Breath and Other (See Comments)    Dyspnea Flushing Because of a history of documented adverse serious drug reaction;Medi Alert bracelet  is recommended PCN reaction causing  immediate rash, facial/tongue/throat swelling, SOB or lightheadedness with hypotension: Yes PCN reaction causing severe rash involving mucus membranes or skin necrosis: No PCN reaction occurring within the last 10 years: NO PCN reaction that required hospitalization: NO   Sulfonamide Derivatives Hives, Shortness Of Breath and Other (See Comments)    Dyspnea  Flushing Because of a history of documented adverse serious drug reaction;Medi Alert bracelet  is recommended   Atorvastatin Other (See Comments)    Myalgias Arthralgias     Social History:   Social History   Socioeconomic History   Marital status: Married    Spouse name: [name]    Number of children: 3   Years of education: 12+   Highest education level: Not on file  Occupational History   Occupation: Retired    Comment: Owns a [employer], Advertising account executive, 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  Tobacco Use   Smoking status: Former    Packs/day: 0.50    Years: 50.00    Total pack years: 25.00    Types: Cigarettes    Quit date: 07/28/2014    Years since quitting:  8.0   Smokeless tobacco: Never  Vaping Use   Vaping Use: Never used  Substance and Sexual Activity   Alcohol use: Yes    Alcohol/week: 4.0 standard drinks of alcohol    Types: 1 Glasses of wine, 1 Cans of beer, 1 Shots of liquor, 1 Standard drinks or equivalent per week    Comment: socially, variable; moderately heavy use in the past   Drug use: No   Sexual activity: Not Currently  Other Topics Concern   Not on file  Social History Narrative   Patient lives at home a lone has caregivers   Patient has 3 children    Patient is right handed    Social Determinants of Health   Financial Resource Strain: Low Risk  (09/18/2021)   Overall Financial Resource Strain (CARDIA)    Difficulty of Paying Living Expenses: Not hard at all  Food Insecurity: No Food Insecurity (09/18/2021)   Hunger Vital Sign    Worried  About Running Out of Food in the Last Year: Never true    Ran Out of Food in the Last Year: Never true  Transportation Needs: No Transportation Needs (09/18/2021)   PRAPARE - Hydrologist (Medical): No    Lack of Transportation (Non-Medical): No  Physical Activity: Sufficiently Active (09/18/2021)   Exercise Vital Sign    Days of Exercise per Week: 3 days    Minutes of Exercise per Session: 60 min  Stress: No Stress Concern Present (09/18/2021)   [place]    Feeling of Stress : Not at all  Social Connections: Socially Isolated (09/18/2021)   Social Connection and Isolation Panel [NHANES]    Frequency of Communication with Friends and Family: More than three times a week    Frequency of Social Gatherings with Friends and Family: Once a week    Attends Religious Services: Never    Marine scientist or Organizations: No    Attends Archivist Meetings: Never    Marital Status: Widowed  Intimate Partner Violence: Not At Risk (09/18/2021)   Humiliation, Afraid, Rape, and Kick questionnaire    Fear of Current or Ex-Partner: No    Emotionally Abused: No    Physically Abused: No    Sexually Abused: No    Family History:   Family History  Problem Relation Age of Onset   Stroke Mother        mini strokes   Alcohol abuse Father    Heart disease Father        MI after 63   Stroke Father    Hypertension Father    Heart attack Father    Heart disease Paternal Aunt        several   Hypertension Paternal Aunt        several   Stroke Paternal Aunt        several   Stroke Paternal Uncle        several   Heart disease Paternal Uncle        several;2 had MI pre 56   Cancer Daughter 23       breast ca, also with benign sessile polyp    Colon cancer Daughter 51   Colon polyps Neg Hx    Esophageal cancer Neg Hx    Rectal cancer Neg Hx    Stomach cancer Neg Hx      ROS:   Please see the history of present  illness.  All other ROS reviewed and negative.     Physical Exam/Data:   Vitals:   08/24/22 1230 08/24/22 1300 08/24/22 1301 08/24/22 1303  BP: (!) 150/66 (!) 161/68    Pulse: 82 76 82   Resp: 18 (!) 28 (!) 23   Temp:    98.3 F (36.8 C)  TempSrc:    Oral  SpO2: 96% 93% 94%   Weight:      Height:        Intake/Output Summary (Last 24 hours) at 08/24/2022 1351 Last data filed at 08/24/2022 0842 Gross per 24 hour  Intake --  Output 400 ml  Net -400 ml      08/24/2022    1:49 AM 08/14/2022    3:29 PM 08/08/2022   10:33 AM  Last 3 Weights  Weight (lbs) 188 lb 191 lb 189 lb  Weight (kg) 85.276 kg 86.637 kg 85.73 kg     Body mass index is 24.8 kg/m.  General:  Well nourished, well developed, in no acute distress HEENT: normal Neck: no JVD Vascular: No carotid bruits; Distal pulses 2+ bilaterally Cardiac:  normal S1, S2; RRR; no murmur  Lungs:  decreased BS bases bilaterally, no wheezing, rhonchi or rales  Abd: soft, nontender, no hepatomegaly  Ext: no edema Musculoskeletal:  No deformities, BUE and BLE strength normal and equal Skin: warm and dry  Neuro:  CNs 2-12 intact, no focal abnormalities noted Psych:  Normal affect   EKG:  The EKG was personally reviewed and demonstrates:  ST, HR 120, RBBB, QRS duration 120 ms, no sig change from 11/14 Telemetry:  Telemetry was personally reviewed and demonstrates:  SR  Relevant CV Studies:  LINQ INTERROGATION: 10/10-11/08/2022 ILR summary report received. Battery status OK. Normal device function. No new symptom, tachy, brady, or pause episodes.  2 new AF episodes, longest duration 28min, controlled rates. Burden 0.1%, Eliquis. Monthly summary reports and ROV/PRN LA  ECHO: 07/02/2021  1. Limited study.   2. Left ventricular ejection fraction, by estimation, is 55 to 60%. The left ventricle has normal function. Left ventricular endocardial border not optimally defined to evaluate  regional wall motion. There is moderate left ventricular hypertrophy.   3. Right ventricular systolic function is mildly reduced. The right ventricular size is normal. Tricuspid regurgitation signal is inadequate for assessing PA pressure.   4. The mitral valve is abnormal. Trivial mitral valve regurgitation. Moderate mitral annular calcification.   5. Tricuspid valve regurgitation is mild to moderate.   6. The aortic valve was not well visualized. There is moderate calcification of the aortic valve.   7. The inferior vena cava is dilated in size with >50% respiratory variability, suggesting RA pressure of 8 mmHg.   Comparison(s): Prior images reviewed side by side. LVEF remains normal range, unable to accurately evaluate regional wall motion.   LEXISCAN MV: 03/29/2020 Nuclear stress EF: 70%. There was no ST segment deviation noted during stress. The study is normal. This is a low risk study. The left ventricular ejection fraction is hyperdynamic (>65%).   Finalized by Skeet Latch, MD on Tue Mar 29, 2020  3:19 PM Nuclear stress EF: 70%. There was no ST segment deviation noted during stress. The study is normal. Findings consistent with ischemia. This is a low risk study. The left ventricular ejection fraction is moderately decreased (30-44%).  CARDIAC CTA: 02/11/2020  Coronary calcium score is 869 which is 69th percentile for age and sex matched peers. There is probable severe stenosis of the  proximal LAD and proximal RCA with mixed atherosclerotic plaque. Mixed atherosclerosis seen in ostial and distal left main coronary artery as well.   Pulmonary artery: Mildly dilated main pulmonary artery, 30 mm.   IMPRESSION: 1. No pulmonary vein stenosis. Anatomically normal pulmonary vein drainage with ligated right lower pulmonary vein.   2. Normal left atrial appendage, no left atrial appendage thrombus. No intracardiac mass or thrombus.   3. The esophagus runs in the left atrial  midline and is not in the proximity to any of the pulmonary veins.   4. Probable severe proximal LAD and RCA stenosis. Consider coronary angiography if clinically indicated.   Electronically Signed   By: Cherlynn Kaiser   On: 02/10/2020 00:16  Laboratory Data:  High Sensitivity Troponin:   Recent Labs  Lab 08/24/22 0137 08/24/22 0448 08/24/22 1036  TROPONINIHS 20* 38* 114*     Chemistry Recent Labs  Lab 08/24/22 0137  NA 141  K 4.1  CL 105  CO2 17*  GLUCOSE 184*  BUN 26*  CREATININE 1.95*  CALCIUM 9.4  GFRNONAA 34*  ANIONGAP 19*    No results for input(s): "PROT", "ALBUMIN", "AST", "ALT", "ALKPHOS", "BILITOT" in the last 168 hours. Lipids No results for input(s): "CHOL", "TRIG", "HDL", "LABVLDL", "LDLCALC", "CHOLHDL" in the last 168 hours.  Hematology Recent Labs  Lab 08/24/22 0137  WBC 5.5  RBC 5.46  HGB 16.9  HCT 50.0  MCV 91.6  MCH 31.0  MCHC 33.8  RDW 15.8*  PLT 264   Thyroid  Lab Results  Component Value Date   TSH 1.69 01/10/2022    B Natriuretic Peptide  Date Value Ref Range Status  08/24/2022 256.7 (H) 0.0 - 100.0 pg/mL Final    Comment:    Performed at [facility], [place] [address], [place], [place] [address]  08/14/2022 186.5 (H) 0.0 - 100.0 pg/mL Final    Comment:    Performed at [facility], [place] [address] [place], [place] [address]  07/31/2022 227.9 (H) 0.0 - 100.0 pg/mL Final    Comment:    Performed at [facility], [place] [address], [place], [place] [address]    DDimer No results for input(s): "DDIMER" in the last 168 hours.   Radiology/Studies:  DG Chest Port 1 View  Result Date: 08/24/2022 CLINICAL DATA:  Shortness of breath EXAM: PORTABLE CHEST 1 VIEW COMPARISON:  08/14/2022 FINDINGS: Chronic right pleural effusion/scarring. Stable linear opacities right lung base. The left lung is clear. Unchanged cardiac and mediastinal contours. Subcutaneous moderate device at the left aspect of the cardiac silhouette.  No acute osseous abnormality. IMPRESSION: Chronic right pleural effusion/scarring. No acute cardiopulmonary process. Electronically Signed   By: Merilyn Baba M.D.   On: 08/24/2022 02:22     Assessment and Plan:   Elevated troponin - trop peak 114 is lower than the 165 he had in 2022 when he was hospitalized with hypoxic respiratory failure secondary to COVID, at that time he was felt to have demand ischemia -With the lack of ischemic symptoms, this is quite likely demand ischemia in the setting of acute respiratory failure - will check an echo, as long as echo stable, no further eval - continue to follow on telemetry - if no ischemic sx and echo ok, follow up with Dr Aundra Dubin  2.  Acute respiratory failure, chronic diastolic CHF -He is requiring oxygen, which he is not on at home. - CXR without acute disease - BNP mildly elevated above baseline  186 - he got lasix 60 mg IV x 1 - do not think additional IV Lasix needed - pta on 60 mg bid per pt, but another source says 80 mg am and 60 mg pm. - with decreased renal function, hold for now  3. ARF on CKD III - Baseline Cr generally about 1.5, but has been higher since August this year - 11/14 labs w/ BUN/Cr 18/1.41, at baseline - on admit, BUN/Cr 26/1.95, c/w dehydration, decreased po intake and continuing to take Lasix - Lasix is on hold.   Otherwise, per IM   Risk Assessment/Risk Scores:       New York Heart Association (NYHA) Functional Class NYHA Class IV  CHA2DS2-VASc Score = 8   This indicates a 10.8% annual risk of stroke. The patient's score is based upon: CHF History: 1 HTN History: 1 Diabetes History: 1 Stroke History: 2 Vascular Disease History: 1 Age Score: 2 Gender Score: 0   For questions or updates, please contact [place] Please consult [email or web address] for contact info under    Signed, Rosaria Ferries, PA-C  08/24/2022 1:51 PM

## 2022-08-24 NOTE — ED Provider Notes (Signed)
[facility] EMERGENCY DEPARTMENT Provider Note   CSN: [id number] Arrival date & time: 08/24/22  0131     History  Chief Complaint  Patient presents with   [place] of [place] [name] is a 81 y.o. male.  The history is provided by the patient and the EMS personnel.  [place] of Breath He has history of hypertension, hyperlipidemia, COPD, lung cancer, diastolic heart failure, atrial fibrillation anticoagulated on apixaban, chronic kidney disease, colon cancer, peripheral vascular disease and comes in with onset about 2 PM of feeling generally weak, short of breath, nausea, vomiting.  He has had a cough productive of sputum but he does not know the color.  He is complaining of pain in his left flank.  He denies fever, chills, sweats.  He denies chest pain, heaviness, tightness, pressure.  He has felt like this in the past when he has been in atrial fibrillation, but today it is worse.  He has also been having a headache for the last 3 weeks.  EMS treated him with magnesium and methylprednisolone and nebulizer treatments with albuterol and ipratropium.   Home Medications Prior to Admission medications   Medication Sig Start Date End Date Taking? Authorizing Provider  acetaminophen (TYLENOL) 325 MG tablet Take 650 mg by mouth every 6 (six) hours as needed for moderate pain or headache.    [provider]  albuterol (VENTOLIN HFA) 108 (90 Base) MCG/ACT inhaler Inhale 2 puffs into the lungs every 6 (six) hours as needed for wheezing or [place] of breath. 07/06/21   Ghimire, Henreitta Leber, MD  amLODipine (NORVASC) 10 MG tablet Take 1 tablet (10 mg total) by mouth daily. 08/14/22   Larey Dresser, MD  Brinzolamide-Brimonidine [facility]) 1-0.2 % SUSP Place 1 drop into both eyes 3 (three) times daily.    [provider]  Budeson-Glycopyrrol-Formoterol (BREZTRI AEROSPHERE) 160-9-4.8 MCG/ACT AERO Inhale 2 puffs into the lungs in the morning and  at bedtime. Patient taking differently: Inhale 2 puffs into the lungs daily. 07/27/21   Icard, Octavio Graves, DO  docusate sodium (COLACE) 100 MG capsule Take 100 mg by mouth 2 (two) times daily.     [provider]  ELIQUIS 5 MG TABS tablet TAKE ONE TABLET BY MOUTH TWICE DAILY 08/20/22   Baldwin Jamaica, PA-C  escitalopram (LEXAPRO) 10 MG tablet TAKE ONE TABLET BY MOUTH EVERY DAY 08/20/22   Binnie Rail, MD  furosemide (LASIX) 20 MG tablet Take 3 tablets (60 mg total) by mouth 2 (two) times daily. 07/10/22   Larey Dresser, MD  JARDIANCE 10 MG TABS tablet TAKE ONE TABLET BY MOUTH EVERY DAY BEFORE BREAKFAST. 01/08/22   Larey Dresser, MD  Melatonin 10 MG TABS Take 20 mg by mouth at bedtime.    [provider]  metoprolol succinate (TOPROL-XL) 25 MG 24 hr tablet Take 1 tablet (25 mg total) by mouth daily. 07/31/22   [place], Maricela Bo, FNP  montelukast (SINGULAIR) 10 MG tablet TAKE ONE TABLET BY MOUTH AT BEDTIME 08/20/22   Binnie Rail, MD  Multiple Vitamins-Minerals (CENTRUM SILVER PO) Take 1 tablet by mouth daily.    [provider]  omeprazole (PRILOSEC) 20 MG capsule Take 1 capsule (20 mg total) by mouth 2 (two) times daily. 05/07/22   Mansouraty, Telford Nab., MD  ondansetron (ZOFRAN) 4 MG tablet Take 1 tablet (4 mg total) by mouth every 6 (six) hours as needed for nausea. 02/16/22   Raiford Noble  Latif, DO  OVER THE COUNTER MEDICATION Take 3 capsules by mouth daily. Balance of [place], Historical, MD  Polyethyl Glycol-Propyl Glycol (SYSTANE ULTRA) 0.4-0.3 % SOLN Place 1 drop into both eyes daily as needed (dry eyes).    [provider]  potassium chloride SA (KLOR-CON M) 20 MEQ tablet TAKE TWO TABLETS BY MOUTH IN THE MORNING AND TAKE ONE TABLET IN THE EVENING 05/10/22   Larey Dresser, MD  rosuvastatin (CRESTOR) 40 MG tablet TAKE ONE TABLET BY MOUTH EVERY DAY 02/09/22   Larey Dresser, MD  senna (SENOKOT) 8.6 MG tablet Take 1 tablet by  mouth 2 (two) times daily.    [provider]  vitamin B-12 (CYANOCOBALAMIN) 1000 MCG tablet Take 1 tablet (1,000 mcg total) by mouth daily. 02/16/22   Raiford Noble Latif, DO      Allergies    Niacin-lovastatin er, Penicillins, Sulfonamide derivatives, and Atorvastatin    Review of Systems   Review of Systems  Respiratory:  Positive for [place] of breath.   All other systems reviewed and are negative.   Physical Exam Updated Vital Signs Temp 98.2 F (36.8 C) (Oral)   Resp (!) 32   SpO2 (!) 85%  Physical Exam Vitals and nursing note reviewed.   81 year old male, appears uncomfortable, but is in no acute distress. Vital signs are significant for elevated respiratory rate, heart rate, blood pressure. Oxygen saturation is 86%, which is hypoxic. Head is normocephalic and atraumatic. PERRLA, EOMI. Oropharynx is clear. Neck is nontender and supple without adenopathy or JVD. Back is nontender and there is no CVA tenderness.  There is no presacral edema. Lungs have coarse expiratory wheezes without rales or rhonchi. Chest is nontender. Heart has regular rate and rhythm without murmur. Abdomen is soft, flat, nontender without masses or hepatosplenomegaly and peristalsis is normoactive. Extremities have 1+ pretibial edema, full range of motion is present. Skin is warm and dry without rash. Neurologic: Mental status is normal, cranial nerves are intact, moves all extremities equally.  ED Results / Procedures / Treatments   Labs (all labs ordered are listed, but only abnormal results are displayed) Labs Reviewed  BRAIN NATRIURETIC PEPTIDE - Abnormal; Notable for the following components:      Result Value   B Natriuretic Peptide 256.7 (*)    All other components within normal limits  BASIC METABOLIC PANEL - Abnormal; Notable for the following components:   CO2 17 (*)    Glucose, Bld 184 (*)    BUN 26 (*)    Creatinine, Ser 1.95 (*)    GFR, Estimated 34 (*)    Anion gap  19 (*)    All other components within normal limits  CBC WITH DIFFERENTIAL/PLATELET - Abnormal; Notable for the following components:   RDW 15.8 (*)    Lymphs Abs 0.2 (*)    All other components within normal limits  URINALYSIS, ROUTINE W REFLEX MICROSCOPIC - Abnormal; Notable for the following components:   Glucose, UA >=500 (*)    Ketones, ur 5 (*)    Protein, ur 30 (*)    All other components within normal limits  TROPONIN I (HIGH SENSITIVITY) - Abnormal; Notable for the following components:   Troponin I (High Sensitivity) 20 (*)    All other components within normal limits  TROPONIN I (HIGH SENSITIVITY) - Abnormal; Notable for the following components:   Troponin I (High Sensitivity) 38 (*)    All other components within normal limits  EKG EKG Interpretation  Date/Time:  Friday August 24 2022 01:39:19 EST Ventricular Rate:  107 PR Interval:    QRS Duration: 120 QT Interval:  381 QTC Calculation: 509 R Axis:   193 Text Interpretation: Sinus tachycardia Right bundle branch block Right axis deviation When compared with ECG of 08/14/2022, HEART RATE has increased Confirmed by Delora Fuel ([id number]) on 08/24/2022 1:43:52 AM  Radiology DG Chest Port 1 View  Result Date: 08/24/2022 CLINICAL DATA:  [place] of breath EXAM: PORTABLE CHEST 1 VIEW COMPARISON:  08/14/2022 FINDINGS: Chronic right pleural effusion/scarring. Stable linear opacities right lung base. The left lung is clear. Unchanged cardiac and mediastinal contours. Subcutaneous moderate device at the left aspect of the cardiac silhouette. No acute osseous abnormality. IMPRESSION: Chronic right pleural effusion/scarring. No acute cardiopulmonary process. Electronically Signed   By: Merilyn Baba M.D.   On: 08/24/2022 02:22    Procedures Procedures  Cardiac monitor shows sinus tachycardia, per my interpretation.  Medications Ordered in ED Medications  ondansetron (ZOFRAN) injection 4 mg (4 mg Intravenous Given  08/24/22 0148)  acetaminophen (TYLENOL) tablet 650 mg (650 mg Oral Given 08/24/22 0148)  furosemide (LASIX) injection 60 mg (60 mg Intravenous Given 08/24/22 0450)   ED Course/ Medical Decision Making/ A&P                           Medical Decision Making Amount and/or Complexity of Data Reviewed Labs: ordered. Radiology: ordered.  Risk OTC drugs. Prescription drug management. Decision regarding hospitalization.   Weakness, dyspnea with hypoxia, nausea and vomiting.  Differential diagnosis includes, but is not limited to, heart failure exacerbation, pneumonia, ACS, electrolyte disturbance.  The coarse wheezes that I hear are not typical of COPD or bronchospasm and I suspect this is a heart failure exacerbation.  I have reviewed his past records, and on 01/13/2019 when he had an echocardiogram showing ejection fraction 60-65% and indeterminant diastolic parameters but elevated left ventricular end-diastolic pressure.  Echocardiogram on 07/02/2021 showed ejection fraction 50-55% with no mention of diastolic parameters or left ventricular end-diastolic pressure.  Chest x-ray shows chronic right pleural effusion/scarring with no acute cardiopulmonary process.  I have independently viewed the image, and agree with radiologist's interpretation.  I have reviewed and interpreted the electrocardiogram, and my interpretation is sinus tachycardia with right bundle branch block.  I have reviewed and interpreted his laboratory test, and my interpretation is renal insufficiency which is slightly worse than baseline, metabolic acidosis which is new, elevated BNP and troponin.  BNP is somewhat more elevated than most recent value consistent with CHF exacerbation, normal WBC but with left shift, normal hemoglobin and platelet levels.  I have ordered a dose of intravenous furosemide.  Patient had fairly good diuresis with furosemide and states that he feels better.  On auscultation, lungs sound much better.  I  gave him a trial off of oxygen, but he still had desaturation down to 86-88%.  Oxygen was turned back on and decision was made to admit him.  Of note, repeat troponin has risen to 38.  This is felt to be from demand ischemia and not a non-STEMI.  I have ordered a dose of aspirin but I am holding off on heparin at this time.  Case is discussed with Dr. Hal Hope of [facility], who agrees to admit the patient.  CRITICAL CARE Performed by: Delora Fuel Total critical care time: 45 minutes Critical care time was exclusive of separately billable procedures and treating  other patients. Critical care was necessary to treat or prevent imminent or life-threatening deterioration. Critical care was time spent personally by me on the following activities: development of treatment plan with patient and/or surrogate as well as nursing, discussions with consultants, evaluation of patient's response to treatment, examination of patient, obtaining history from patient or surrogate, ordering and performing treatments and interventions, ordering and review of laboratory studies, ordering and review of radiographic studies, pulse oximetry and re-evaluation of patient's condition.  Final Clinical Impression(s) / ED Diagnoses Final diagnoses:  Hypoxia  Acute on chronic congestive heart failure, unspecified heart failure type ([place])  Elevated troponin  Renal insufficiency    Rx / DC Orders ED Discharge Orders     None         Delora Fuel, MD 01/64/29 [phone]

## 2022-08-24 NOTE — ED Triage Notes (Signed)
Meds given by EMS duo neb tx, 2g Mag and 125mg  Solumedrol

## 2022-08-24 NOTE — ED Notes (Signed)
ED TO INPATIENT HANDOFF REPORT  ED Nurse Name and Phone #: Terius Jacuinde RN [phone]  S Name/Age/Gender Troy Kindred [name] 81 y.o. male Room/Bed: 041C/041C  Code Status   Code Status: DNR  Home/SNF/Other Rehab Patient oriented to: self, place, time, and situation Is this baseline? Yes   Triage Complete: Triage complete  Chief Complaint Acute CHF (congestive heart failure) ([place]) [I50.9]  Triage Note Pt via EMS from home, daughter called EMS for n/v since lunch time with SOB. H/A X 3 wks   Meds given by EMS duo neb tx, 2g Mag and 125mg  Solumedrol    Allergies Allergies  Allergen Reactions   Niacin-Lovastatin Er Shortness Of Breath and Other (See Comments)    Dyspnea Flushing   Penicillins Hives, Shortness Of Breath and Other (See Comments)    Dyspnea Flushing Because of a history of documented adverse serious drug reaction;Medi Alert bracelet  is recommended PCN reaction causing immediate rash, facial/tongue/throat swelling, SOB or lightheadedness with hypotension: Yes PCN reaction causing severe rash involving mucus membranes or skin necrosis: No PCN reaction occurring within the last 10 years: NO PCN reaction that required hospitalization: NO   Sulfonamide Derivatives Hives, Shortness Of Breath and Other (See Comments)    Dyspnea  Flushing Because of a history of documented adverse serious drug reaction;Medi Alert bracelet  is recommended   Atorvastatin Other (See Comments)    Myalgias Arthralgias     Level of Care/Admitting Diagnosis ED Disposition     ED Disposition  Admit   Condition  --   [place]: [place] [[id number]]  Level of Care: Telemetry Cardiac [[id number]]  May place patient in observation at [facility] or [facility] if equivalent level of care is available:: No  Covid Evaluation: Asymptomatic - no recent exposure (last 10 days) testing not required  Diagnosis: Acute CHF (congestive heart failure) [facility]) [[id number]]   Admitting Physician: Rise Patience [phone]  Attending Physician: Rise Patience Lei.Right          B Medical/Surgery History Past Medical History:  Diagnosis Date   AAA (abdominal aortic aneurysm) ([place]) 10-20-17   MONITORED BY DR [name], 3.7 CM by ABDOMINAL US 11/2021   Anxiety    Arthritis    Atrial fibrillation (HCC)    Basal cell carcinoma    BPH (benign prostatic hypertrophy)    Chronic diastolic heart failure (HCC)    Chronic kidney disease    stage 3, pt unaware   Colon cancer ([place])    COPD (chronic obstructive pulmonary disease) ([place])    Depression    GERD (gastroesophageal reflux disease)    Glaucoma BOTH EYES   Dr Gershon Crane   History of lung cancer APRIL 2012   ONCOLOGIST- DR Tressie Stalker  LOV [facility] 10-27-2012 SQUAMOUS CELL---- S/P RIGHT LOWER LOBECTOMY AT [facility] -- NO CHEMORADIATION--- NO RECURRENCE   Hx of adenomatous colonic polyps 2005    X 2; 1 hyperplastic polyp; Dr Olevia Perches   Hyperlipidemia    Hypertension    OSA (obstructive sleep apnea) 08/29/2015   CPAP SET ON 10   Peripheral vascular disease ([place])    FOLLOWED  BY DR DICKSON   Spinal stenosis 06/2014   Thoracic aorta atherosclerosis (HCC)    Thyrotoxicosis    amiodarone induced   Past Surgical History:  Procedure Laterality Date   ANGIO PLASTY     X 4 in legs   AORTOGRAM  07-27-2002   MILD DIFFUSE ILIAC ARTERY OCCLUSIVE DISEASE /  LEFT RENAL ARTERY 20%/ PATENT LEFT FEM-POP GRAFT/ MILD SFA AND POPLITEAL ARTERY OCCLUSIVE DISEASE W/ SEVERE KIDNEY OCCLUSIVE DISEASE   ATRIAL FIBRILLATION ABLATION N/A 04/09/2019   Procedure: ATRIAL FIBRILLATION ABLATION;  Surgeon: Thompson Grayer, MD;  Location: [place] CV LAB;  Service: Cardiovascular;  Laterality: N/A;   ATRIAL FIBRILLATION ABLATION N/A 02/12/2020   Procedure: ATRIAL FIBRILLATION ABLATION;  Surgeon: Thompson Grayer, MD;  Location: [place] CV LAB;  Service: Cardiovascular;  Laterality: N/A;   BALLOON DILATION N/A 02/06/2022   Procedure: BALLOON  Tamponade ampulla;  Surgeon: Irving Copas., MD;  Location: [name];  Service: Gastroenterology;  Laterality: N/A;   BASAL CELL CARCINOMA EXCISION     MULTIPLE TIMES--  RIGHT FOREARM, CHEEKS, AND BACK   BIOPSY  06/25/2018   Procedure: BIOPSY;  Surgeon: Rush Landmark [place] Nab., MD;  Location: [place];  Service: Gastroenterology;;   BIOPSY  02/06/2022   Procedure: BIOPSY;  Surgeon: Irving Copas., MD;  [facility];  Service: Gastroenterology;;   CARDIOVASCULAR STRESS TEST  12-08-2012  DR [facility]   NORMAL LEXISCAN WITH NO EXERCISE NUCLEAR STUDY/ EF 66%/   NO ISCHEMIA/ NO SIGNIFICANT CHANGE FROM PRIOR STUDY   CARDIOVERSION N/A 02/14/2018   Procedure: CARDIOVERSION;  Surgeon: Larey Dresser, MD;  Location: [facility] ENDOSCOPY;  Service: Cardiovascular;  Laterality: N/A;   CARDIOVERSION N/A 07/10/2022   Procedure: CARDIOVERSION;  Surgeon: Larey Dresser, MD;  Location: [place];  Service: Cardiovascular;  Laterality: N/A;   CAROTID ANGIOGRAM N/A 07/10/2013   Procedure: CAROTID ANGIOGRAM;  Surgeon: Elam Dutch, MD;  Location: [facility] C[facility] LAB;  Service: Cardiovascular;  Laterality: N/A;   CAROTID ENDARTERECTOMY Right 07-14-13   cea   CATARACT EXTRACTION W/ INTRAOCULAR LENS  IMPLANT, BILATERAL     colon polyectomy     COLONOSCOPY     COLONOSCOPY WITH PROPOFOL N/A 06/25/2018   Procedure: COLONOSCOPY WITH PROPOFOL;  Surgeon: Rush Landmark [place] Nab., MD;  Location: [place];  Service: Gastroenterology;  Laterality: N/A;   CYSTOSCOPY W/ RETROGRADES Bilateral 01/21/2013   Procedure: CYSTOSCOPY WITH RETROGRADE PYELOGRAM;  Surgeon: Molli Hazard, MD;  Location: [facility];  Service: Urology;  Laterality: Bilateral;   CYSTO, BLADDER BIOPSY, BILATERAL RETROGRADE PYELOGRAM  RAD TECH FROM RADIOLOGY PER JOY   CYSTOSCOPY WITH BIOPSY N/A 01/21/2013   Procedure: CYSTOSCOPY WITH BIOPSY;  Surgeon: Molli Hazard, MD;  Location: [facility];  Service: Urology;  Laterality: N/A;   ENDARTERECTOMY Right 07/14/2013   Procedure: ENDARTERECTOMY CAROTID;  Surgeon: Angelia Mould, MD;  Location: [facility] OR;  Service: Vascular;  Laterality: Right;   ENDOSCOPIC RETROGRADE CHOLANGIOPANCREATOGRAPHY (ERCP) WITH PROPOFOL N/A 02/06/2022   Procedure: ENDOSCOPIC RETROGRADE CHOLANGIOPANCREATOGRAPHY (ERCP) WITH PROPOFOL;  Surgeon: Irving Copas., MD;  Location: [name];  Service: Gastroenterology;  Laterality: N/A;   EP IMPLANTABLE DEVICE N/A 08/12/2015   Procedure: Loop Recorder Insertion;  Surgeon: Thompson Grayer, MD;  Location: [place] CV LAB;  Service: Cardiovascular;  Laterality: N/A;   ESOPHAGOGASTRODUODENOSCOPY Left 02/13/2022   Procedure: ESOPHAGOGASTRODUODENOSCOPY (EGD);  Surgeon: Lavena Bullion, DO;  Location: [facility] ENDOSCOPY;  Service: Gastroenterology;  Laterality: Left;   ESOPHAGOGASTRODUODENOSCOPY (EGD) WITH PROPOFOL N/A 06/25/2018   Procedure: ESOPHAGOGASTRODUODENOSCOPY (EGD) WITH PROPOFOL;  Surgeon: Rush Landmark Telford Nab., MD;  Location: [name];  Service: Gastroenterology;  Laterality: N/A;   ESOPHAGOGASTRODUODENOSCOPY (EGD) WITH PROPOFOL N/A 07/10/2018   Procedure: ESOPHAGOGASTRODUODENOSCOPY (EGD) WITH PROPOFOL;  Surgeon: Milus Banister, MD;  [facility];  Service: Endoscopy;  Laterality: N/A;  EUS N/A 07/10/2018   Procedure: UPPER ENDOSCOPIC ULTRASOUND (EUS) RADIAL;  Surgeon: Milus Banister, MD;  [facility];  Service: Endoscopy;  Laterality: N/A;   EYE SURGERY Right    FEMORAL-POPLITEAL BYPASS GRAFT Left [place]   AND 2001  IN [place]   FEMORAL-POPLITEAL BYPASS GRAFT Left 08/30/2015   Procedure: REVISION OF BYPASS GRAFT Left  FEMORAL-POPLITEAL ARTERY;  Surgeon: Angelia Mould, MD;  Location: [place];  Service: Vascular;  Laterality: Left;   FINE NEEDLE ASPIRATION N/A 07/10/2018   Procedure: FINE NEEDLE ASPIRATION (FNA) LINEAR;  Surgeon: Milus Banister, MD;   [facility];  Service: Endoscopy;  Laterality: N/A;   FLEXIBLE SIGMOIDOSCOPY N/A 12/12/2018   Procedure: FLEXIBLE SIGMOIDOSCOPY;  Surgeon: Ileana Roup, MD;  [facility] ORS;  Service: General;  Laterality: N/A;   HEMOSTASIS CLIP PLACEMENT  02/13/2022   Procedure: HEMOSTASIS CLIP PLACEMENT;  Surgeon: Lavena Bullion, DO;  Location: [place];  Service: Gastroenterology;;   HOT HEMOSTASIS N/A 02/13/2022   Procedure: HOT HEMOSTASIS (ARGON PLASMA COAGULATION/BICAP);  Surgeon: Lavena Bullion, DO;  Location: [facility] ENDOSCOPY;  Service: Gastroenterology;  Laterality: N/A;   LARYNGOSCOPY  06-27-2004   BX VOCAL CORD  (LEUKOPLAKIA)  PER PT NO ISSUES SINCE   LEFT HEART CATH AND CORONARY ANGIOGRAPHY N/A 08/20/2017   Procedure: LEFT HEART CATH AND CORONARY ANGIOGRAPHY;  Surgeon: Larey Dresser, MD;  Location: [place] CV LAB;  Service: Cardiovascular;  Laterality: N/A;   LOOP RECORDER INSERTION N/A 04/09/2019   Procedure: LOOP RECORDER INSERTION;  Surgeon: Thompson Grayer, MD;  Location: [place] CV LAB;  Service: Cardiovascular;  Laterality: N/A;   LOOP RECORDER REMOVAL N/A 04/09/2019   Procedure: LOOP RECORDER REMOVAL;  Surgeon: Thompson Grayer, MD;  Location: [place] CV LAB;  Service: Cardiovascular;  Laterality: N/A;   LOWER EXTREMITY ANGIOGRAM Bilateral 08/29/2015   Procedure: Lower Extremity Angiogram;  Surgeon: Angelia Mould, MD;  Location: [name] CV LAB;  Service: Cardiovascular;  Laterality: Bilateral;   LUNG LOBECTOMY  01/24/2011    RIGHT UPPER LOBE  (SQUAMOUS CELL CARCINOMA) Dr Dorthea [place] , [facility] No chemotherapyor radiation   PANCREATIC STENT PLACEMENT  02/06/2022   Procedure: PANCREATIC STENT PLACEMENT;  Surgeon: Rush Landmark Telford Nab., MD;  Location[name];  Service: Gastroenterology;;   [facility] ANGIOPLASTY Right 07/14/2013   Procedure: PATCH ANGIOPLASTY;  Surgeon: Angelia Mould, MD;  Location: [place];  Service: Vascular;  Laterality: Right;    PATCH ANGIOPLASTY Left 08/30/2015   Procedure: VEIN PATCH ANGIOPLASTY OF PROXIMAL Left BYPASS GRAFT;  Surgeon: Angelia Mould, MD;  Location: [place];  Service: Vascular;  Laterality: Left;   PERIPHERAL VASCULAR CATHETERIZATION N/A 08/29/2015   Procedure: Abdominal Aortogram;  Surgeon: Angelia Mould, MD;  Location: [place] CV LAB;  Service: Cardiovascular;  Laterality: N/A;   POLYPECTOMY  06/25/2018   Procedure: POLYPECTOMY;  Surgeon: Rush Landmark Telford Nab., MD;  Location: [place];  Service: Gastroenterology;;   POLYPECTOMY     POLYPECTOMY  02/13/2022   Procedure: POLYPECTOMY;  Surgeon: Lavena Bullion, DO;  Location: [place] ENDOSCOPY;  Service: Gastroenterology;;   REMOVAL OF STONES  02/06/2022   Procedure: REMOVAL OF STONES;  Surgeon: Irving Copas., MD;  Location[name];  Service: Gastroenterology;;   Clide Deutscher  02/13/2022   Procedure: Clide Deutscher;  Surgeon: Lavena Bullion, DO;  Location: [place];  Service: Gastroenterology;;   Joan Mayans  02/06/2022   Procedure: Joan Mayans;  Surgeon: Irving Copas., MD;  Location: [facility];  Service: Gastroenterology;;   SUBMUCOSAL INJECTION  06/25/2018   Procedure: SUBMUCOSAL INJECTION;  Surgeon: Irving Copas., MD;  Location: [place];  Service: Gastroenterology;;   TEE WITHOUT CARDIOVERSION N/A 02/14/2018   Procedure: TRANSESOPHAGEAL ECHOCARDIOGRAM (TEE);  Surgeon: Larey Dresser, MD;  Location: [facility] ENDOSCOPY;  Service: Cardiovascular;  Laterality: N/A;   trabecular surgery     OS   TRANSTHORACIC ECHOCARDIOGRAM  12-29-2012  DR [facility]   MILD LVH/  LVSF NORMAL/ EF 95-62%/  GRADE I DIASTOLIC DYSFUNCTION     A IV Location/Drains/Wounds Patient Lines/Drains/Airways Status     Active Line/Drains/Airways     Name Placement date Placement time Site Days   Peripheral IV 08/24/22 20 G Right Antecubital 08/24/22  0130  Antecubital  less than 1   GI Stent 02/06/22  1119  --   199   Incision (Closed) 12/12/18 Abdomen 12/12/18  1052  -- 1351   Incision - 5 Ports Abdomen Mid;Upper Right;Upper;Medial Medial;Mid;Right Right;Lateral Right;Lower 12/12/18  0808  -- 1351   Wound / Incision (Open or Dehisced) 02/12/22 Laceration Face Left;Upper 02/12/22  1942  Face  193            Intake/Output Last 24 hours  Intake/Output Summary (Last 24 hours) at 08/24/2022 1506 Last data filed at 08/24/2022 1308 Gross per 24 hour  Intake --  Output 400 ml  Net -400 ml    Labs/Imaging Results for orders placed or performed during the hospital encounter of 08/24/22 (from the past 48 hour(s))  Brain natriuretic peptide     Status: Abnormal   Collection Time: 08/24/22  1:37 AM  Result Value Ref Range   B Natriuretic Peptide 256.7 (H) 0.0 - 100.0 pg/mL    Comment: Performed at [facility], [place] [address], [place], [place] [address]  Basic metabolic panel     Status: Abnormal   Collection Time: 08/24/22  1:37 AM  Result Value Ref Range   Sodium 141 135 - 145 mmol/L   Potassium 4.1 3.5 - 5.1 mmol/L   Chloride 105 98 - 111 mmol/L   CO2 17 (L) 22 - 32 mmol/L   Glucose, Bld 184 (H) 70 - 99 mg/dL    Comment: Glucose reference range applies only to samples taken after fasting for at least 8 hours.   BUN 26 (H) 8 - 23 mg/dL   Creatinine, Ser 1.95 (H) 0.61 - 1.24 mg/dL   Calcium 9.4 8.9 - 10.3 mg/dL   GFR, Estimated 34 (L) >60 mL/min    Comment: (NOTE) Calculated using the CKD-EPI Creatinine Equation (2021)    Anion gap 19 (H) 5 - 15    Comment: Performed at [facility] [address] [place], [place] [address]  Troponin I (High Sensitivity)     Status: Abnormal   Collection Time: 08/24/22  1:37 AM  Result Value Ref Range   Troponin I (High Sensitivity) 20 (H) <18 ng/L    Comment: (NOTE) Elevated high sensitivity troponin I (hsTnI) values and significant  changes across serial measurements may suggest ACS but many other  chronic and acute conditions are  known to elevate hsTnI results.  Refer to the "Links" section for chest pain algorithms and additional  guidance. Performed at [facility], [place] [address] [place], [place] [address]   CBC with Differential     Status: Abnormal   Collection Time: 08/24/22  1:37 AM  Result Value Ref Range   WBC 5.5 4.0 - 10.5  K/uL   RBC 5.46 4.22 - 5.81 MIL/uL   Hemoglobin 16.9 13.0 - 17.0 g/dL   HCT 50.0 39.0 - 52.0 %   MCV 91.6 80.0 - 100.0 fL   MCH 31.0 26.0 - 34.0 pg   MCHC 33.8 30.0 - 36.0 g/dL   RDW 15.8 (H) 11.5 - 15.5 %   Platelets 264 150 - 400 K/uL   nRBC 0.0 0.0 - 0.2 %   Neutrophils Relative % 92 %   Neutro Abs 5.0 1.7 - 7.7 K/uL   Lymphocytes Relative 4 %   Lymphs Abs 0.2 (L) 0.7 - 4.0 K/uL   Monocytes Relative 4 %   Monocytes Absolute 0.2 0.1 - 1.0 K/uL   Eosinophils Relative 0 %   Eosinophils Absolute 0.0 0.0 - 0.5 K/uL   Basophils Relative 0 %   Basophils Absolute 0.0 0.0 - 0.1 K/uL   Immature Granulocytes 0 %   Abs Immature Granulocytes 0.01 0.00 - 0.07 K/uL    Comment: Performed at [facility] [address], [place], [place] [address]  Troponin I (High Sensitivity)     Status: Abnormal   Collection Time: 08/24/22  4:48 AM  Result Value Ref Range   Troponin I (High Sensitivity) 38 (H) <18 ng/L    Comment: (NOTE) Elevated high sensitivity troponin I (hsTnI) values and significant  changes across serial measurements may suggest ACS but many other  chronic and acute conditions are known to elevate hsTnI results.  Refer to the "Links" section for chest pain algorithms and additional  guidance. Performed at [facility], [place] [address] [place], [place] [address]   Urinalysis, Routine w reflex microscopic Urine, Clean Catch     Status: Abnormal   Collection Time: 08/24/22  5:01 AM  Result Value Ref Range   Color, Urine YELLOW YELLOW   APPearance CLEAR CLEAR   Specific Gravity, Urine 1.022 1.005 - 1.030   pH 5.0 5.0 - 8.0   Glucose, UA >=500 (A)  NEGATIVE mg/dL   Hgb urine dipstick NEGATIVE NEGATIVE   Bilirubin Urine NEGATIVE NEGATIVE   Ketones, ur 5 (A) NEGATIVE mg/dL   Protein, ur 30 (A) NEGATIVE mg/dL   Nitrite NEGATIVE NEGATIVE   Leukocytes,Ua NEGATIVE NEGATIVE   RBC / HPF 0-5 0 - 5 RBC/hpf   WBC, UA 0-5 0 - 5 WBC/hpf   Bacteria, UA NONE SEEN NONE SEEN   Squamous Epithelial / LPF 0-5 0 - 5   Mucus PRESENT    Hyaline Casts, UA PRESENT     Comment: Performed at [facility], [place] [address] [place], [place] [address]  Troponin I (High Sensitivity)     Status: Abnormal   Collection Time: 08/24/22 10:36 AM  Result Value Ref Range   Troponin I (High Sensitivity) 114 (HH) <18 ng/L    Comment: CRITICAL RESULT CALLED TO, READ BACK BY AND VERIFIED WITH Bulmaro Feagans RN @1300  11.24.2023 E.AHMED (NOTE) Elevated high sensitivity troponin I (hsTnI) values and significant  changes across serial measurements may suggest ACS but many other  chronic and acute conditions are known to elevate hsTnI results.  Refer to the "Links" section for chest pain algorithms and additional  guidance. Performed at [facility], [place] [address], [place], [place] [address]    *Note: Due to a large number of results and/or encounters for the requested time period, some results have not been displayed. A complete set of results can be found in Results Review.  DG Chest Port 1 View  Result Date: 08/24/2022 CLINICAL DATA:  Shortness of breath EXAM: PORTABLE CHEST 1 VIEW COMPARISON:  08/14/2022 FINDINGS: Chronic right pleural effusion/scarring. Stable linear opacities right lung base. The left lung is clear. Unchanged cardiac and mediastinal contours. Subcutaneous moderate device at the left aspect of the cardiac silhouette. No acute osseous abnormality. IMPRESSION: Chronic right pleural effusion/scarring. No acute cardiopulmonary process. Electronically Signed   By: Merilyn Baba M.D.   On: 08/24/2022 02:22    Pending Labs Unresulted Labs (From  admission, onward)     Start     Ordered   08/25/22 1607  Basic metabolic panel  Tomorrow morning,   R        08/24/22 0846   08/25/22 0500  CBC  Tomorrow morning,   R        08/24/22 0846   08/24/22 0845  Expectorated Sputum Assessment w Gram Stain, Rflx to Resp Cult  (COPD / Pneumonia / Cellulitis / Lower Extremity Wound)  Once,   R        08/24/22 0846            Vitals/Pain Today's Vitals   08/24/22 1303 08/24/22 1330 08/24/22 1400 08/24/22 1415  BP:  (!) 143/70 138/65   Pulse:  81 81 84  Resp:  19 (!) 23 20  Temp: 98.3 F (36.8 C)     TempSrc: Oral     SpO2:  96% 95% 93%  Weight:      Height:      PainSc:        Isolation Precautions No active isolations  Medications Medications  amLODipine (NORVASC) tablet 10 mg (10 mg Oral Given 08/24/22 0920)  metoprolol succinate (TOPROL-XL) 24 hr tablet 25 mg (25 mg Oral Given 08/24/22 0921)  rosuvastatin (CRESTOR) tablet 40 mg (40 mg Oral Given 08/24/22 0921)  escitalopram (LEXAPRO) tablet 10 mg (10 mg Oral Given 08/24/22 0920)  pantoprazole (PROTONIX) EC tablet 40 mg (40 mg Oral Given 08/24/22 0921)  melatonin tablet 20 mg (has no administration in time range)  Budeson-Glycopyrrol-Formoterol 160-9-4.8 MCG/ACT AERO 2 puff (2 puffs Inhalation Not Given 08/24/22 1221)  montelukast (SINGULAIR) tablet 10 mg (has no administration in time range)  brinzolamide (AZOPT) 1 % ophthalmic suspension 1 drop (1 drop Both Eyes Not Given 08/24/22 1222)    And  brimonidine (ALPHAGAN) 0.2 % ophthalmic solution 1 drop (1 drop Both Eyes Not Given 08/24/22 1222)  polyvinyl alcohol (LIQUIFILM TEARS) 1.4 % ophthalmic solution 1 drop (has no administration in time range)  methylPREDNISolone sodium succinate (SOLU-MEDROL) 125 mg/2 mL injection 60 mg (60 mg Intravenous Given 08/24/22 0917)    Followed by  predniSONE (DELTASONE) tablet 40 mg (has no administration in time range)  albuterol (PROVENTIL) (2.5 MG/3ML) 0.083% nebulizer solution 2.5 mg  (has no administration in time range)  sodium chloride flush (NS) 0.9 % injection 3 mL (3 mLs Intravenous Given 08/24/22 1051)  lactated ringers infusion ( Intravenous New Bag/Given 08/24/22 0919)  acetaminophen (TYLENOL) tablet 650 mg (has no administration in time range)    Or  acetaminophen (TYLENOL) suppository 650 mg (has no administration in time range)  polyethylene glycol (MIRALAX / GLYCOLAX) packet 17 g (has no administration in time range)  bisacodyl (DULCOLAX) EC tablet 5 mg (has no administration in time range)  ondansetron (ZOFRAN) tablet 4 mg (has no administration in time range)    Or  ondansetron (ZOFRAN) injection 4 mg (has no administration in time range)  guaiFENesin ([place])  12 hr tablet 600 mg (has no administration in time range)  hydrALAZINE (APRESOLINE) injection 5 mg (has no administration in time range)  azithromycin (ZITHROMAX) 500 mg in sodium chloride 0.9 % 250 mL IVPB (0 mg Intravenous Stopped 08/24/22 1121)    Followed by  azithromycin (ZITHROMAX) tablet 500 mg (has no administration in time range)  oxyCODONE (Oxy IR/ROXICODONE) immediate release tablet 5 mg (has no administration in time range)  zolpidem (AMBIEN) tablet 5 mg (has no administration in time range)  apixaban (ELIQUIS) tablet 2.5 mg (2.5 mg Oral Given 08/24/22 0920)  latanoprost (XALATAN) 0.005 % ophthalmic solution 1 drop (has no administration in time range)  ondansetron (ZOFRAN) injection 4 mg (4 mg Intravenous Given 08/24/22 0148)  acetaminophen (TYLENOL) tablet 650 mg (650 mg Oral Given 08/24/22 0148)  furosemide (LASIX) injection 60 mg (60 mg Intravenous Given 08/24/22 0450)  aspirin chewable tablet 324 mg (324 mg Oral Given 08/24/22 0810)    Mobility walks with device Moderate fall risk   Focused Assessments Cardiac Assessment Handoff:  Cardiac Rhythm: Normal sinus rhythm Lab Results  Component Value Date   CKTOTAL 41 02/08/2015   TROPONINI <0.03 06/24/2018   Lab Results   Component Value Date   DDIMER 2.64 (H) 07/03/2021   Does the Patient currently have chest pain? No   , Pulmonary Assessment Handoff:  Lung sounds: Bilateral Breath Sounds: Diminished O2 Device: Nasal Cannula O2 Flow Rate (L/min): 2.5 L/min    R Recommendations: See Admitting Provider Note  Report given to:   Additional Notes:

## 2022-08-25 DIAGNOSIS — E860 Dehydration: Secondary | ICD-10-CM | POA: Diagnosis present

## 2022-08-25 DIAGNOSIS — E785 Hyperlipidemia, unspecified: Secondary | ICD-10-CM | POA: Diagnosis present

## 2022-08-25 DIAGNOSIS — J440 Chronic obstructive pulmonary disease with acute lower respiratory infection: Secondary | ICD-10-CM | POA: Diagnosis present

## 2022-08-25 DIAGNOSIS — I5031 Acute diastolic (congestive) heart failure: Secondary | ICD-10-CM | POA: Diagnosis not present

## 2022-08-25 DIAGNOSIS — I480 Paroxysmal atrial fibrillation: Secondary | ICD-10-CM | POA: Diagnosis present

## 2022-08-25 DIAGNOSIS — N1831 Chronic kidney disease, stage 3a: Secondary | ICD-10-CM | POA: Diagnosis present

## 2022-08-25 DIAGNOSIS — E1151 Type 2 diabetes mellitus with diabetic peripheral angiopathy without gangrene: Secondary | ICD-10-CM | POA: Diagnosis present

## 2022-08-25 DIAGNOSIS — I2510 Atherosclerotic heart disease of native coronary artery without angina pectoris: Secondary | ICD-10-CM | POA: Diagnosis present

## 2022-08-25 DIAGNOSIS — J9621 Acute and chronic respiratory failure with hypoxia: Secondary | ICD-10-CM | POA: Diagnosis present

## 2022-08-25 DIAGNOSIS — I130 Hypertensive heart and chronic kidney disease with heart failure and stage 1 through stage 4 chronic kidney disease, or unspecified chronic kidney disease: Secondary | ICD-10-CM | POA: Diagnosis present

## 2022-08-25 DIAGNOSIS — I714 Abdominal aortic aneurysm, without rupture, unspecified: Secondary | ICD-10-CM | POA: Diagnosis present

## 2022-08-25 DIAGNOSIS — Z1152 Encounter for screening for COVID-19: Secondary | ICD-10-CM | POA: Diagnosis not present

## 2022-08-25 DIAGNOSIS — G4733 Obstructive sleep apnea (adult) (pediatric): Secondary | ICD-10-CM | POA: Diagnosis present

## 2022-08-25 DIAGNOSIS — E1122 Type 2 diabetes mellitus with diabetic chronic kidney disease: Secondary | ICD-10-CM | POA: Diagnosis present

## 2022-08-25 DIAGNOSIS — I5043 Acute on chronic combined systolic (congestive) and diastolic (congestive) heart failure: Secondary | ICD-10-CM | POA: Diagnosis not present

## 2022-08-25 DIAGNOSIS — I779 Disorder of arteries and arterioles, unspecified: Secondary | ICD-10-CM | POA: Diagnosis present

## 2022-08-25 DIAGNOSIS — I5033 Acute on chronic diastolic (congestive) heart failure: Secondary | ICD-10-CM

## 2022-08-25 DIAGNOSIS — F39 Unspecified mood [affective] disorder: Secondary | ICD-10-CM | POA: Diagnosis present

## 2022-08-25 DIAGNOSIS — I10 Essential (primary) hypertension: Secondary | ICD-10-CM | POA: Diagnosis not present

## 2022-08-25 DIAGNOSIS — J189 Pneumonia, unspecified organism: Secondary | ICD-10-CM | POA: Diagnosis present

## 2022-08-25 DIAGNOSIS — N179 Acute kidney failure, unspecified: Secondary | ICD-10-CM | POA: Diagnosis present

## 2022-08-25 DIAGNOSIS — I509 Heart failure, unspecified: Secondary | ICD-10-CM

## 2022-08-25 DIAGNOSIS — J439 Emphysema, unspecified: Secondary | ICD-10-CM | POA: Diagnosis present

## 2022-08-25 DIAGNOSIS — I5032 Chronic diastolic (congestive) heart failure: Secondary | ICD-10-CM | POA: Diagnosis not present

## 2022-08-25 DIAGNOSIS — F05 Delirium due to known physiological condition: Secondary | ICD-10-CM | POA: Diagnosis present

## 2022-08-25 DIAGNOSIS — J441 Chronic obstructive pulmonary disease with (acute) exacerbation: Secondary | ICD-10-CM | POA: Diagnosis present

## 2022-08-25 DIAGNOSIS — I2489 Other forms of acute ischemic heart disease: Secondary | ICD-10-CM | POA: Diagnosis present

## 2022-08-25 DIAGNOSIS — Z66 Do not resuscitate: Secondary | ICD-10-CM | POA: Diagnosis present

## 2022-08-25 DIAGNOSIS — N289 Disorder of kidney and ureter, unspecified: Secondary | ICD-10-CM | POA: Diagnosis present

## 2022-08-25 DIAGNOSIS — N400 Enlarged prostate without lower urinary tract symptoms: Secondary | ICD-10-CM | POA: Diagnosis present

## 2022-08-25 LAB — BASIC METABOLIC PANEL
Anion gap: 12 (ref 5–15)
BUN: 34 mg/dL — ABNORMAL HIGH (ref 8–23)
CO2: 22 mmol/L (ref 22–32)
Calcium: 8.8 mg/dL — ABNORMAL LOW (ref 8.9–10.3)
Chloride: 103 mmol/L (ref 98–111)
Creatinine, Ser: 1.7 mg/dL — ABNORMAL HIGH (ref 0.61–1.24)
GFR, Estimated: 40 mL/min — ABNORMAL LOW (ref >60)
Glucose, Bld: 164 mg/dL — ABNORMAL HIGH (ref 70–99)
Potassium: 4 mmol/L (ref 3.5–5.1)
Sodium: 137 mmol/L (ref 135–145)

## 2022-08-25 LAB — CBC
HCT: 39.3 % (ref 39.0–52.0)
Hemoglobin: 13 g/dL (ref 13.0–17.0)
MCH: 30 pg (ref 26.0–34.0)
MCHC: 33.1 g/dL (ref 30.0–36.0)
MCV: 90.8 fL (ref 80.0–100.0)
Platelets: 217 10*3/uL (ref 150–400)
RBC: 4.33 MIL/uL (ref 4.22–5.81)
RDW: 15.8 % — ABNORMAL HIGH (ref 11.5–15.5)
WBC: 6.5 10*3/uL (ref 4.0–10.5)
nRBC: 0 % (ref 0.0–0.2)

## 2022-08-25 LAB — TROPONIN I (HIGH SENSITIVITY): Troponin I (High Sensitivity): 111 ng/L (ref <18)

## 2022-08-25 MED ORDER — FUROSEMIDE 40 MG PO TABS
60.0000 mg | ORAL_TABLET | Freq: Every day | ORAL | Status: DC
  Administered 2022-08-25: 60 mg via ORAL
  Filled 2022-08-25: qty 1

## 2022-08-25 MED ORDER — FUROSEMIDE 40 MG PO TABS: 80.0000 mg | ORAL_TABLET | Freq: Every day | ORAL | Status: DC

## 2022-08-25 MED ORDER — FUROSEMIDE 40 MG PO TABS: 60.0000 mg | ORAL_TABLET | Freq: Every day | ORAL | Status: DC

## 2022-08-25 MED ORDER — FUROSEMIDE 40 MG PO TABS
80.0000 mg | ORAL_TABLET | Freq: Every day | ORAL | Status: DC
  Administered 2022-08-25 – 2022-08-26 (×2): 80 mg via ORAL
  Filled 2022-08-25 (×2): qty 2

## 2022-08-25 MED ORDER — FUROSEMIDE 20 MG PO TABS: 60.0000 mg | ORAL_TABLET | ORAL | Status: DC

## 2022-08-25 NOTE — Progress Notes (Signed)
OT evaluation: PTA patient reports independent with ADLs and mobility using rollator vs RW for the last few weeks.  He has a caregivers during the day who assist with IADLs due to low vision, but was completing basic ADLs without assistance.  He was admitted for above and presents with problem list below, including impaired balance, generalized weakness, decreased activity tolerance, and decreased cardiopulmonary status.  He is on 2L upon entry, requires 3L during activity to maintain SpO2 >90%.  Patient requires min assist for bed mobility, mod assist for transfers using RW and setup to total assist for ADLs.  He presents with unsteadiness and posterior lean in standing, requiring max verbal cues due to visual deficits to take a few steps towards [facility] in room.  Patients son arrives during session and reports pt has had several falls at night (no caregiver at night) attempting to get to the bathroom.  Based on performance today, pt will best benefit from continued OT service acutely and after dc at SNF level to optimize independence, safety for ADLs and mobility. Will follow acutely.    08/25/22 0900  OT Visit Information  Last OT Received On 08/25/22  Assistance Needed +2 (for mobility progression)  History of Present Illness Pt is an 81 y/o male presenting on 11/24 with SOB. Admitted with acute respiratory failure with associated COPD exacerbation, AKI, and chronic diastolic CHF.  PMHx significant for CHF, A-fib, COPD, PAF, CKD IIIa, colon and lung cancer, glaucoma, PVD s/p stent, OSA on CPAP, and HLD.  Precautions  Precautions Fall  Precaution Comments watch O2  Restrictions  Weight Bearing Restrictions No  Home Living  Family/patient expects to be discharged to: Private residence  Living Arrangements Alone  Available Help at Discharge Personal care attendant;Family;Available PRN/intermittently (caregiver during day 9am-8pm, alone at night)  Type of Home House  Home Access Stairs to enter   Entrance Stairs-Number of Steps 3  Entrance Stairs-Rails Right  Home Layout Two level;Able to live on main level with bedroom/bathroom  Bathroom Shower/Tub Walk-in shower  [place] bars - tub/shower;Shower Land (2 wheels);Cane - single point;Rollator (4 wheels);BSC/3in1  Additional Comments Caregivers 5-7 days/week, 9-2 on sat, and sun; 9-8 M-F  Prior Function  Prior Level of Function  Needs assist;Patient poor historian/Family not available  Mobility Comments using rollator or walker for mobility for the last few weeks but reports prior to independent- no falls in the last 6 months. Son arrives during session and reports pt has been using rollator for a while and has had several falls, question history of pt.  ADLs Comments Caregiver assists with meds, assists with cooking, driving, grocery shopping, laundry due to pt low vision. Assists with picking clothes out, but pt able to dress himself, bathe self and toilet without assist  Communication  Communication No difficulties  Pain Assessment  Pain Assessment No/denies pain  Cognition  Arousal/Alertness Awake/alert  Behavior During Therapy [facility] for tasks assessed/performed  Overall Cognitive Status Impaired/Different from baseline  Area of Impairment Memory;Problem solving;Awareness;Safety/judgement;Following commands  Memory Decreased short-term memory  Following Commands Follows one step commands consistently;Follows one step commands with increased time;Follows multi-step commands inconsistently  Safety/Judgement Decreased awareness of safety  Awareness Emergent  Problem Solving Slow processing;Difficulty sequencing;Requires verbal cues  General Comments patient oriented, follows simple commands but difficulty with mulitple step.  Son reports different PLOF compared to patient and question pts history.  Upper Extremity Assessment  Upper Extremity Assessment Generalized  weakness  Lower  Extremity Assessment  Lower Extremity Assessment Defer to PT evaluation  Vision- History  Baseline Vision/History 3 Glaucoma  Ability to See in Adequate Light 3 Highly impaired  Patient Visual Report Other (comment) (L eye worse than R eye, reports legally blind)  Vision- Assessment  Additional Comments pt reports vision is baseline  ADL  Overall ADL's  Needs assistance/impaired  Grooming Supervision/safety;Set up;Sitting  Upper Body Dressing  Minimal assistance;Sitting  Lower Body Dressing Total assistance;Sit to/from stand  Lower Body Dressing Details (indicate cue type and reason) pt requires assist for socks, mod assist to stand  Toilet Transfer Moderate assistance;Rolling walker (2 wheels)  Toilet Transfer Details (indicate cue type and reason) simulated side stepping to [facility], pt declines recliner  Functional mobility during ADLs Moderate assistance;Rolling walker (2 wheels);Cueing for safety;Cueing for sequencing  General ADL Comments pt limited byweakness, balance, and visual deficits  Bed Mobility  Overal bed mobility Needs Assistance  Bed Mobility Supine to Sit;Sit to Supine  Supine to sit Min assist  Sit to supine Min assist  General bed mobility comments trunk support and balance coming to EOB, increased time and effort  Transfers  Overall transfer level Needs assistance  Equipment used Rolling walker (2 wheels)  Transfers Sit to/from Stand  Sit to Stand Mod assist  General transfer comment mod assist to power up and steady with cueing for hand placement and foward translation, preference to posterior lean.  Balance  Overall balance assessment Needs assistance  Sitting-balance support No upper extremity supported;Feet supported  Sitting balance-Leahy Scale Fair  Sitting balance - Comments min guard to close supervision statically, up to min assist for attempts at LB dressing  Standing balance support Bilateral upper extremity supported;During functional  activity  Standing balance-Leahy Scale Poor  Standing balance comment posterior lean and relies on BUE and external support  General Comments  General comments (skin integrity, edema, etc.) Pt on 2L Dresser upon entry, requires 3L to sustapin O2 >90% during activity  OT - End of Session  Equipment Utilized During Treatment Rolling walker (2 wheels);Gait belt;Oxygen  Activity Tolerance Patient tolerated treatment well  Patient left in bed;with call bell/phone within reach;with bed alarm set;with nursing/sitter in room;with family/visitor present  Nurse Communication Mobility status  OT Assessment  OT Recommendation/Assessment Patient needs continued OT Services  OT Visit Diagnosis Other abnormalities of gait and mobility (R26.89);Muscle weakness (generalized) (M62.81)  OT Problem List Decreased strength;Decreased activity tolerance;Impaired balance (sitting and/or standing);Decreased safety awareness;Decreased knowledge of use of DME or AE;Decreased knowledge of precautions;Cardiopulmonary status limiting activity;Decreased cognition  OT Plan  OT Frequency (ACUTE ONLY) Min 2X/week  OT Treatment/Interventions (ACUTE ONLY) Self-care/ADL training;Therapeutic exercise;DME and/or AE instruction;Energy conservation;Therapeutic activities;Patient/family education;Balance training;Cognitive remediation/compensation  AM-PAC OT "6 Clicks" Daily Activity Outcome Measure (Version 2)  Help from another person eating meals? 3  Help from another person taking care of personal grooming? 3  Help from another person toileting, which includes using toliet, bedpan, or urinal? 2  Help from another person bathing (including washing, rinsing, drying)? 2  Help from another person to put on and taking off regular upper body clothing? 3  Help from another person to put on and taking off regular lower body clothing? 1  6 Click Score 14  Progressive Mobility  What is the highest level of mobility based on the progressive  mobility assessment? Level 3 (Stands with assist) - Balance while standing  and cannot march in place  Mobility Referral Yes  Activity Ambulated with assistance in room  OT  Recommendation  Follow Up Recommendations Skilled nursing-short term rehab (<3 hours/day)  Assistance recommended at discharge Frequent or constant Supervision/Assistance  Patient can return home with the following A lot of help with walking and/or transfers;A lot of help with bathing/dressing/bathroom;Assistance with cooking/housework;Direct supervision/assist for medications management;Direct supervision/assist for financial management;Assist for transportation;Help with stairs or ramp for entrance  Functional Status Assessent Patient has had a recent decline in their functional status and demonstrates the ability to make significant improvements in function in a reasonable and predictable amount of time.  OT Equipment None recommended by OT  Individuals Consulted  Consulted and Agree with Results and Recommendations Patient  Acute Rehab OT Goals  Patient Stated Goal get better  OT Goal Formulation With patient  Time For Goal Achievement 09/08/22  Potential to Achieve Goals Good  OT Time Calculation  OT Start Time (ACUTE ONLY) 0941  OT Stop Time (ACUTE ONLY) 1036  OT Time Calculation (min) 55 min  OT General Charges  $OT Visit 1 Visit  OT Evaluation  $OT Eval Moderate Complexity 1 Mod  OT Treatments  $Self Care/Home Management  38-52 mins  Written Expression  Dominant Hand Right   Jolaine Artist, [place] Office [phone]

## 2022-08-25 NOTE — Progress Notes (Signed)
Rounding Note    Patient Name: Troy Marszalek [name] Date of Encounter: 08/25/2022  [place] Cardiologist: [name], MD    Subjective   81 year old gentleman with with a history of hypertension, chronic diastolic congestive heart failure, atrial fibrillation-status post ablation, chronic kidney disease, peripheral vascular disease history of lung cancer  He is typically followed by [name] in the advanced heart failure clinic.  He had recurrent atrial fibrillation.  He is a poor candidate for redo A-fib ablation.  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  He was seen yesterday.  He had minimally elevated troponin levels, likely due to demand ischemia.  He presented to the emergency room yesterday with shortness of breath and diarrhea bradycardia he was also found to have elevated troponin levels.  Repeat echocardiogram performed on August 24, 2022  EF 50 to 55% on high-dose oral nitrates chronic.  We were not able to assess his diastolic function.  He has moderate left atrial enlargement and right atrial enlargement.     Inpatient Medications    Scheduled Meds:  amLODipine  10 mg Oral Daily   apixaban  2.5 mg Oral BID   azithromycin  500 mg Oral Daily   brinzolamide  1 drop Both Eyes TID   And   brimonidine  1 drop Both Eyes TID   escitalopram  10 mg Oral Daily   fluticasone furoate-vilanterol  1 puff Inhalation Daily   latanoprost  1 drop Both Eyes QHS   melatonin  20 mg Oral QHS   metoprolol succinate  25 mg Oral Daily   montelukast  10 mg Oral QHS   pantoprazole  40 mg Oral Daily   predniSONE  40 mg Oral Q breakfast   rosuvastatin  40 mg Oral Daily   sodium chloride flush  3 mL Intravenous Q12H   umeclidinium bromide  1 puff Inhalation Daily   Continuous Infusions:  PRN Meds: acetaminophen **OR** acetaminophen, albuterol, bisacodyl, guaiFENesin, hydrALAZINE, ondansetron **OR** ondansetron (ZOFRAN) IV,  oxyCODONE, polyethylene glycol, polyvinyl alcohol, zolpidem   Vital Signs    Vitals:   08/25/22 0008 08/25/22 0449 08/25/22 0734 08/25/22 0816  BP: (!) 140/75 139/69  (!) 155/80  Pulse: 71 68  73  Resp:      Temp: 97.8 F (36.6 C) 97.6 F (36.4 C)    TempSrc: Oral Oral    SpO2: 96% 93% 93% 98%  Weight:      Height:        Intake/Output Summary (Last 24 hours) at 08/25/2022 1046 Last data filed at 08/25/2022 0000 Gross per 24 hour  Intake --  Output 201 ml  Net -201 ml      08/24/2022    6:22 PM 08/24/2022    1:49 AM 08/14/2022    3:29 PM  Last 3 Weights  Weight (lbs) 177 lb 7.5 oz 188 lb 191 lb  Weight (kg) 80.5 kg 85.276 kg 86.637 kg      Telemetry    - Personally Reviewed  ECG     - Personally Reviewed  Physical Exam   GEN: No acute distress.   Neck: No JVD Cardiac: RRR, no murmurs, rubs, or gallops.  Respiratory: Clear to auscultation bilaterally. GI: Soft, nontender, non-distended  MS: No edema; No deformity. Neuro:  Nonfocal  Psych: Normal affect   Labs    High Sensitivity Troponin:   Recent Labs  Lab 08/24/22 0137 08/24/22 0448 08/24/22 1036  TROPONINIHS 20* 38* 114*  Chemistry Recent Labs  Lab 08/24/22 0137 08/25/22 0200  NA 141 137  K 4.1 4.0  CL 105 103  CO2 17* 22  GLUCOSE 184* 164*  BUN 26* 34*  CREATININE 1.95* 1.70*  CALCIUM 9.4 8.8*  GFRNONAA 34* 40*  ANIONGAP 19* 12    Lipids No results for input(s): "CHOL", "TRIG", "HDL", "LABVLDL", "LDLCALC", "CHOLHDL" in the last 168 hours.  Hematology Recent Labs  Lab 08/24/22 0137 08/25/22 0200  WBC 5.5 6.5  RBC 5.46 4.33  HGB 16.9 13.0  HCT 50.0 39.3  MCV 91.6 90.8  MCH 31.0 30.0  MCHC 33.8 33.1  RDW 15.8* 15.8*  PLT 264 217   Thyroid No results for input(s): "TSH", "FREET4" in the last 168 hours.  BNP Recent Labs  Lab 08/24/22 0137  BNP 256.7*    DDimer No results for input(s): "DDIMER" in the last 168 hours.   Radiology    ECHOCARDIOGRAM  COMPLETE  Result Date: 08/24/2022    ECHOCARDIOGRAM REPORT   Patient Name:   Troy Neyhart [name] Date of Exam: 08/24/2022 Medical Rec #:  [id number]              Height:       73.0 in Accession #:    [id number]             Weight:       188.0 lb Date of Birth:  09-May-1941             BSA:          2.096 m Patient Age:    25 years               BP:           145/68 mmHg Patient Gender: M                      HR:           78 bpm. Exam Location:  Inpatient Procedure: 2D Echo, Cardiac Doppler, Color Doppler and Intracardiac            Opacification Agent Indications:    CHF-Acute Diastolic P61.95  History:        Patient has prior history of Echocardiogram examinations, most                 recent 07/02/2021. CHF, PAD and TIA, Arrythmias:Atrial                 Fibrillation, Signs/Symptoms:Dyspnea and Syncope; Risk                 Factors:Sleep Apnea, Hypertension, Dyslipidemia and Former                 Smoker.  Sonographer:    Greer Pickerel Referring Phys: 9 RHONDA G BARRETT  Sonographer Comments: Image acquisition challenging due to COPD, Image acquisition challenging due to respiratory motion and Image acquisition challenging due to patient body habitus. IMPRESSIONS  1. Left ventricular ejection fraction, by estimation, is 50 to 55%. The left ventricle has low normal function. Left ventricular endocardial border not optimally defined to evaluate regional wall motion. Left ventricular diastolic parameters are indeterminate.  2. Right ventricular systolic function is normal. The right ventricular size is normal. Tricuspid regurgitation signal is inadequate for assessing PA pressure.  3. Left atrial size was moderately dilated.  4. Right atrial size was moderately dilated.  5. The mitral valve is abnormal. No evidence of mitral valve regurgitation.  No evidence of mitral stenosis.  6. The aortic valve was not well visualized. Aortic valve regurgitation is not visualized. No aortic stenosis is present.  7. The  inferior vena cava is dilated in size with <50% respiratory variability, suggesting right atrial pressure of 15 mmHg.  8. Difficult study; images largerly done in the subcostal view. FINDINGS  Left Ventricle: Left ventricular ejection fraction, by estimation, is 50 to 55%. The left ventricle has low normal function. Left ventricular endocardial border not optimally defined to evaluate regional wall motion. Definity contrast agent was given IV  to delineate the left ventricular endocardial borders. The left ventricular internal cavity size was normal in size. Suboptimal image quality limits for assessment of left ventricular hypertrophy. Left ventricular diastolic parameters are indeterminate. Right Ventricle: The right ventricular size is normal. No increase in right ventricular wall thickness. Right ventricular systolic function is normal. Tricuspid regurgitation signal is inadequate for assessing PA pressure. Left Atrium: Left atrial size was moderately dilated. Right Atrium: Right atrial size was moderately dilated. Pericardium: There is no evidence of pericardial effusion. Mitral Valve: The mitral valve is abnormal. Mild mitral annular calcification. No evidence of mitral valve regurgitation. No evidence of mitral valve stenosis. Tricuspid Valve: The tricuspid valve is grossly normal. Tricuspid valve regurgitation is trivial. No evidence of tricuspid stenosis. Aortic Valve: The aortic valve was not well visualized. Aortic valve regurgitation is not visualized. No aortic stenosis is present. Pulmonic Valve: The pulmonic valve was not well visualized. Pulmonic valve regurgitation is not visualized. Aorta: The aortic arch was not well visualized and the ascending aorta was not well visualized. Venous: The inferior vena cava is dilated in size with less than 50% respiratory variability, suggesting right atrial pressure of 15 mmHg. IAS/Shunts: The interatrial septum appears to be lipomatous. No atrial level shunt  detected by color flow Doppler.   Diastology LV e' medial:    5.91 cm/s LV E/e' medial:  10.2 LV e' lateral:   8.05 cm/s LV E/e' lateral: 7.5  RIGHT VENTRICLE RV S prime:     8.81 cm/s TAPSE (M-mode): 1.8 cm LEFT ATRIUM           Index        RIGHT ATRIUM           Index LA Vol (A2C): 33.4 ml 15.94 ml/m  RA Area:     25.00 cm LA Vol (A4C): 84.5 ml 40.32 ml/m  RA Volume:   77.50 ml  36.98 ml/m  AORTIC VALVE LVOT Vmax:   82.00 cm/s LVOT Vmean:  52.500 cm/s LVOT VTI:    0.142 m MITRAL VALVE MV Area (PHT): 4.89 cm    SHUNTS MV Decel Time: 155 msec    Systemic VTI: 0.14 m MV E velocity: 60.20 cm/s MV A velocity: 32.80 cm/s MV E/A ratio:  1.84 Rudean Haskell MD Electronically signed by Rudean Haskell MD Signature Date/Time: 08/24/2022/5:07:05 PM    Final    CT HEAD WO CONTRAST (5MM)  Result Date: 08/24/2022 CLINICAL DATA:  Headache with recent worsening in severity and frequency. EXAM: CT HEAD WITHOUT CONTRAST TECHNIQUE: Contiguous axial images were obtained from the base of the skull through the vertex without intravenous contrast. RADIATION DOSE REDUCTION: This exam was performed according to the departmental dose-optimization program which includes automated exposure control, adjustment of the mA and/or kV according to patient size and/or use of iterative reconstruction technique. COMPARISON:  02/12/2022 FINDINGS: Brain: Ventricles, cisterns and other CSF spaces are normal. There is chronic ischemic microvascular  disease present. There is no mass, mass effect, shift of midline structures or acute hemorrhage. No evidence of acute infarction. Vascular: No hyperdense vessel or unexpected calcification. Skull: Normal. Negative for fracture or focal lesion. Sinuses/Orbits: No acute findings. Other: None. IMPRESSION: 1. No acute findings. 2. Chronic ischemic microvascular disease. Electronically Signed   By: Marin Olp M.D.   On: 08/24/2022 15:35   DG Chest Port 1 View  Result Date:  08/24/2022 CLINICAL DATA:  Shortness of breath EXAM: PORTABLE CHEST 1 VIEW COMPARISON:  08/14/2022 FINDINGS: Chronic right pleural effusion/scarring. Stable linear opacities right lung base. The left lung is clear. Unchanged cardiac and mediastinal contours. Subcutaneous moderate device at the left aspect of the cardiac silhouette. No acute osseous abnormality. IMPRESSION: Chronic right pleural effusion/scarring. No acute cardiopulmonary process. Electronically Signed   By: Merilyn Baba M.D.   On: 08/24/2022 02:22    Cardiac Studies     Patient Profile     81 y.o. male   Assessment & Plan     Dyspnea :   Possibly due to a viral illness.  He is associated diarrhea for the past several weeks.  Echocardiogram reveals no significant change.  He has low normal LVEF with chronic diastolic dysfunction.  2.  Hypertension: His blood pressure is up today.   May be due to prednisone   Would continue to follow     For questions or updates, please contact [place] Please consult [email or web address] for contact info under        Signed, Mertie Moores, MD  08/25/2022, 10:46 AM

## 2022-08-25 NOTE — Progress Notes (Signed)
Pt refused cpap. Cpap at bedside

## 2022-08-25 NOTE — Progress Notes (Signed)
Refused cpap.

## 2022-08-25 NOTE — Progress Notes (Signed)
PROGRESS NOTE    Troy Digirolamo [name]  CLE:[id number] DOB: 20-May-1941 DOA: 08/24/2022 PCP: Binnie Rail, MD  Chief Complaint  Patient presents with   Shortness of Breath    Brief Narrative:  Troy Kindred [name] is Troy [name] 81 y.o. male with medical history significant of afib on Eliquis; chronic diastolic CHF; stage 3 CKD; colon and lung cancer; COPD; glaucoma; HTN; HLD; PVD s/p stent; and OSA on CPAP presenting with SOB.  He reports that his oldest daughter made him some, had SOB and diarrhea, urinary incontinence.  Incontinence started with Lasix (30 BID -> 60 ->80).  SOB has worsened in the last 3 weeks.  Come cough, nonproductive.  Does not wear home O2, just CPAP at home.  Diarrhea started yesterday after lunch, several episodes but no longer present.  No chest pain.   ER Course:  Carryover, per Dr. Hal Hope:   81 year old male with history of hypertension, hyperlipidemia, COPD, lung cancer, diastolic heart failure, atrial fibrillation anticoagulated on apixaban, chronic kidney disease, colon cancer, peripheral vascular disease presents with Troy [name] worsening shortness of breath with peripheral edema ER physician feels the symptoms are consistent with CHF exacerbation was given 60 mg IV Lasix admitted for CHF exacerbation.  Has been requiring oxygen which is new for the patient.   He's currently being treated for Troy [name] COPD exacerbation.  Home lasix to be resumed 11/26.  Assessment & Plan:   Active Problems:   Chronic renal disease, stage 3, moderately decreased glomerular filtration rate between 30-59 mL/min/1.73 square meter (HCC)   Glaucoma   PAF (paroxysmal atrial fibrillation) (HCC)   OSA (obstructive sleep apnea)   Benign essential HTN   Chronic diastolic CHF (congestive heart failure) (HCC)   AKI (acute kidney injury) ([place])   COPD with acute exacerbation (HCC)   Hyperlipidemia   DNR (do not resuscitate)  Acute on chronic respiratory failure associated with Troy [name] COPD  exacerbation -Patient's shortness of breath and cough are most likely caused by acute COPD exacerbation.  -He has history of COPD but does not wear home O2; O2 sats in the ER were as low as 86% -currently requiring 2 L [place], wean as tolerated -CXR with chronic right pleural effusion/scarring, no acute cardiopulm process -Nebulizers: scheduled Duoneb and prn albuterol -Solu-Medrol 80 mg IV BID -> Prednisone 40 mg PO daily -IV Azithromycin  -Continue Breztri, Singulair -Coordinated care with TOC team/PT/OT/Nutrition/RT consults   AKI on stage 3a CKD -Patient with baseline compromised renal function, stage 3a CKD  -baseline creatinine appears to be around 1.5 -peaked at 1.7 here -improving, trend - resume lasix, and continue holding losartan    Chronic diastolic CHF  Elevated Troponin -Hold Lasix and Jardiance for now -troponin elevated to 114 - suspect demand -echo with EF 50-55%, LV endocardial border not optimally defined to evaluate regional wall motion, IVC dilated with <50% resp variability  -appreciate cardiology assistance -resume lasix 11/25 -apparently per admitting provider had been having incontinence with lasix, not necessarily unexpected, would follow going forward on resumption   Chronic Right Pleural Effusion - based on 07/26/22 pulm note, looks like he's had thoracentesis of R side in past (based on lab results, looks like that was in 07/2021?).  Would recommend continued follow up with pulmoanry regarding this.  They're also following right upper lobe nodule with low level pet uptake.  Headache Had head CT for this on presentation (without acute findings), no complaints to me, will follow   Afib -Rate controlled with metoprolol -Continue  Eliquis    HTN -Hold losartan -Continue metprolol, amlodipine   HLD -Continue Crestor   DM -Recent A1c was 6.8, indicating good control -hold Jardiance -Cover with moderate-scale SSI    Glaucoma -Advanced visual  impairment -Continue latanoprost, Simbrinza   AAA -Larger than prior in March 2023 -Due for annual f/u with vascular again next March   Mood d/o -Continue Lexapro, melatonin   OSA -Continue CPAP   DNR -based on discussion with patient per admitting physician     DVT prophylaxis: eliquis Code Status: DNR Family Communication: none Disposition:   Status is: Observation The patient will require care spanning > 2 midnights and should be moved to inpatient because: continued need for inpatient care for COPD exacerbation   Consultants:  cardiology  Procedures:  Echo IMPRESSIONS     1. Left ventricular ejection fraction, by estimation, is 50 to 55%. The  left ventricle has low normal function. Left ventricular endocardial  border not optimally defined to evaluate regional wall motion. Left  ventricular diastolic parameters are  indeterminate.   2. Right ventricular systolic function is normal. The right ventricular  size is normal. Tricuspid regurgitation signal is inadequate for assessing  PA pressure.   3. Left atrial size was moderately dilated.   4. Right atrial size was moderately dilated.   5. The mitral valve is abnormal. No evidence of mitral valve  regurgitation. No evidence of mitral stenosis.   6. The aortic valve was not well visualized. Aortic valve regurgitation  is not visualized. No aortic stenosis is present.   7. The inferior vena cava is dilated in size with <50% respiratory  variability, suggesting right atrial pressure of 15 mmHg.   8. Difficult study; images largerly done in the subcostal view.   Antimicrobials:  Anti-infectives (From admission, onward)    Start     Dose/Rate Route Frequency Ordered Stop   08/25/22 0900  azithromycin (ZITHROMAX) tablet 500 mg       See Hyperspace for full Linked Orders Report.   500 mg Oral Daily 08/24/22 0846 08/29/22 0959   08/24/22 0845  azithromycin (ZITHROMAX) 500 mg in sodium chloride 0.9 % 250 mL IVPB        See Hyperspace for full Linked Orders Report.   500 mg 250 mL/hr over 60 Minutes Intravenous Every 24 hours 08/24/22 0846 08/24/22 1121       Subjective: Sob since Friday No new complaints today  Objective: Vitals:   08/25/22 0449 08/25/22 0734 08/25/22 0816 08/25/22 1244  BP: 139/69  (!) 155/80 (!) 151/77  Pulse: 68  73   Resp:    20  Temp: 97.6 F (36.4 C)   (!) 97.4 F (36.3 C)  TempSrc: Oral   Oral  SpO2: 93% 93% 98%   Weight:      Height:        Intake/Output Summary (Last 24 hours) at 08/25/2022 1523 Last data filed at 08/25/2022 0000 Gross per 24 hour  Intake --  Output 201 ml  Net -201 ml   Filed Weights   08/24/22 0149 08/24/22 1822  Weight: 85.3 kg 80.5 kg    Examination:  General exam: Appears calm and comfortable  Respiratory system: coarse breath sounds, on 2 L [name] Cardiovascular system: S1 & S2 heard, RRR. Gastrointestinal system: Abdomen is nondistended, soft and nontender.  Central nervous system: Alert and oriented. No focal neurological deficits. Extremities: no LEE    Data Reviewed: I have personally reviewed following labs and imaging studies  CBC: Recent Labs  Lab 08/24/22 0137 08/25/22 0200  WBC 5.5 6.5  NEUTROABS 5.0  --   HGB 16.9 13.0  HCT 50.0 39.3  MCV 91.6 90.8  PLT 264 440    Basic Metabolic Panel: Recent Labs  Lab 08/24/22 0137 08/25/22 0200  NA 141 137  K 4.1 4.0  CL 105 103  CO2 17* 22  GLUCOSE 184* 164*  BUN 26* 34*  CREATININE 1.95* 1.70*  CALCIUM 9.4 8.8*    GFR: Estimated Creatinine Clearance: 38.5 mL/min (Makisha Marrin) (by C-G formula based on SCr of 1.7 mg/dL (H)).  Liver Function Tests: No results for input(s): "AST", "ALT", "ALKPHOS", "BILITOT", "PROT", "ALBUMIN" in the last 168 hours.  CBG: No results for input(s): "GLUCAP" in the last 168 hours.   No results found for this or any previous visit (from the past 240 hour(s)).       Radiology Studies: ECHOCARDIOGRAM COMPLETE  Result Date:  08/24/2022    ECHOCARDIOGRAM REPORT   Patient Name:   Troy Lesniak [name] Date of Exam: 08/24/2022 Medical Rec #:  [id number]              Height:       73.0 in Accession #:    [id number]             Weight:       188.0 lb Date of Birth:  03-31-1941             BSA:          2.096 m Patient Age:    49 years               BP:           145/68 mmHg Patient Gender: M                      HR:           78 bpm. Exam Location:  Inpatient Procedure: 2D Echo, Cardiac Doppler, Color Doppler and Intracardiac            Opacification Agent Indications:    CHF-Acute Diastolic J18.84  History:        Patient has prior history of Echocardiogram examinations, most                 recent 07/02/2021. CHF, PAD and TIA, Arrythmias:Atrial                 Fibrillation, Signs/Symptoms:Dyspnea and Syncope; Risk                 Factors:Sleep Apnea, Hypertension, Dyslipidemia and Former                 Smoker.  Sonographer:    Greer Pickerel Referring Phys: 63 RHONDA G BARRETT  Sonographer Comments: Image acquisition challenging due to COPD, Image acquisition challenging due to respiratory motion and Image acquisition challenging due to patient body habitus. IMPRESSIONS  1. Left ventricular ejection fraction, by estimation, is 50 to 55%. The left ventricle has low normal function. Left ventricular endocardial border not optimally defined to evaluate regional wall motion. Left ventricular diastolic parameters are indeterminate.  2. Right ventricular systolic function is normal. The right ventricular size is normal. Tricuspid regurgitation signal is inadequate for assessing PA pressure.  3. Left atrial size was moderately dilated.  4. Right atrial size was moderately dilated.  5. The mitral valve is abnormal. No evidence of mitral valve regurgitation. No evidence of  mitral stenosis.  6. The aortic valve was not well visualized. Aortic valve regurgitation is not visualized. No aortic stenosis is present.  7. The inferior vena cava is  dilated in size with <50% respiratory variability, suggesting right atrial pressure of 15 mmHg.  8. Difficult study; images largerly done in the subcostal view. FINDINGS  Left Ventricle: Left ventricular ejection fraction, by estimation, is 50 to 55%. The left ventricle has low normal function. Left ventricular endocardial border not optimally defined to evaluate regional wall motion. Definity contrast agent was given IV  to delineate the left ventricular endocardial borders. The left ventricular internal cavity size was normal in size. Suboptimal image quality limits for assessment of left ventricular hypertrophy. Left ventricular diastolic parameters are indeterminate. Right Ventricle: The right ventricular size is normal. No increase in right ventricular wall thickness. Right ventricular systolic function is normal. Tricuspid regurgitation signal is inadequate for assessing PA pressure. Left Atrium: Left atrial size was moderately dilated. Right Atrium: Right atrial size was moderately dilated. Pericardium: There is no evidence of pericardial effusion. Mitral Valve: The mitral valve is abnormal. Mild mitral annular calcification. No evidence of mitral valve regurgitation. No evidence of mitral valve stenosis. Tricuspid Valve: The tricuspid valve is grossly normal. Tricuspid valve regurgitation is trivial. No evidence of tricuspid stenosis. Aortic Valve: The aortic valve was not well visualized. Aortic valve regurgitation is not visualized. No aortic stenosis is present. Pulmonic Valve: The pulmonic valve was not well visualized. Pulmonic valve regurgitation is not visualized. Aorta: The aortic arch was not well visualized and the ascending aorta was not well visualized. Venous: The inferior vena cava is dilated in size with less than 50% respiratory variability, suggesting right atrial pressure of 15 mmHg. IAS/Shunts: The interatrial septum appears to be lipomatous. No atrial level shunt detected by color flow  Doppler.   Diastology LV e' medial:    5.91 cm/s LV E/e' medial:  10.2 LV e' lateral:   8.05 cm/s LV E/e' lateral: 7.5  RIGHT VENTRICLE RV S prime:     8.81 cm/s TAPSE (M-mode): 1.8 cm LEFT ATRIUM           Index        RIGHT ATRIUM           Index LA Vol (A2C): 33.4 ml 15.94 ml/m  RA Area:     25.00 cm LA Vol (A4C): 84.5 ml 40.32 ml/m  RA Volume:   77.50 ml  36.98 ml/m  AORTIC VALVE LVOT Vmax:   82.00 cm/s LVOT Vmean:  52.500 cm/s LVOT VTI:    0.142 m MITRAL VALVE MV Area (PHT): 4.89 cm    SHUNTS MV Decel Time: 155 msec    Systemic VTI: 0.14 m MV E velocity: 60.20 cm/s MV Arlo Buffone velocity: 32.80 cm/s MV E/Braiden Rodman ratio:  1.84 Rudean Haskell MD Electronically signed by Rudean Haskell MD Signature Date/Time: 08/24/2022/5:07:05 PM    Final    CT HEAD WO CONTRAST (5MM)  Result Date: 08/24/2022 CLINICAL DATA:  Headache with recent worsening in severity and frequency. EXAM: CT HEAD WITHOUT CONTRAST TECHNIQUE: Contiguous axial images were obtained from the base of the skull through the vertex without intravenous contrast. RADIATION DOSE REDUCTION: This exam was performed according to the departmental dose-optimization program which includes automated exposure control, adjustment of the mA and/or kV according to patient size and/or use of iterative reconstruction technique. COMPARISON:  02/12/2022 FINDINGS: Brain: Ventricles, cisterns and other CSF spaces are normal. There is chronic ischemic microvascular disease present. There  is no mass, mass effect, shift of midline structures or acute hemorrhage. No evidence of acute infarction. Vascular: No hyperdense vessel or unexpected calcification. Skull: Normal. Negative for fracture or focal lesion. Sinuses/Orbits: No acute findings. Other: None. IMPRESSION: 1. No acute findings. 2. Chronic ischemic microvascular disease. Electronically Signed   By: Marin Olp M.D.   On: 08/24/2022 15:35   DG Chest Port 1 View  Result Date: 08/24/2022 CLINICAL DATA:   Shortness of breath EXAM: PORTABLE CHEST 1 VIEW COMPARISON:  08/14/2022 FINDINGS: Chronic right pleural effusion/scarring. Stable linear opacities right lung base. The left lung is clear. Unchanged cardiac and mediastinal contours. Subcutaneous moderate device at the left aspect of the cardiac silhouette. No acute osseous abnormality. IMPRESSION: Chronic right pleural effusion/scarring. No acute cardiopulmonary process. Electronically Signed   By: Merilyn Baba M.D.   On: 08/24/2022 02:22        Scheduled Meds:  amLODipine  10 mg Oral Daily   apixaban  2.5 mg Oral BID   azithromycin  500 mg Oral Daily   brinzolamide  1 drop Both Eyes TID   And   brimonidine  1 drop Both Eyes TID   escitalopram  10 mg Oral Daily   fluticasone furoate-vilanterol  1 puff Inhalation Daily   latanoprost  1 drop Both Eyes QHS   melatonin  20 mg Oral QHS   metoprolol succinate  25 mg Oral Daily   montelukast  10 mg Oral QHS   pantoprazole  40 mg Oral Daily   predniSONE  40 mg Oral Q breakfast   rosuvastatin  40 mg Oral Daily   sodium chloride flush  3 mL Intravenous Q12H   umeclidinium bromide  1 puff Inhalation Daily   Continuous Infusions:   LOS: 0 days    Time spent: over 30 min    Fayrene Helper, MD [facility]   To contact the attending provider between 7A-7P or the covering provider during after hours 7P-7A, please log into the web site [email or web address] and access using universal [place] password for that web site. If you do not have the password, please call the hospital operator.  08/25/2022, 3:23 PM

## 2022-08-25 NOTE — Evaluation (Signed)
Physical Therapy Evaluation Patient Details Name: Troy Gupton [name] MRN: [id number] DOB: 23-Mar-1941 Today's Date: 08/25/2022  History of Present Illness  Pt is an 81 y/o male presenting on 11/24 with SOB. Admitted with acute respiratory failure with associated COPD exacerbation, AKI, and chronic diastolic CHF.  PMHx significant for CHF, A-fib, COPD, PAF, CKD IIIa, colon and lung cancer, glaucoma, PVD s/p stent, OSA on CPAP, and HLD.   Clinical Impression  Pt in bed upon arrival of PT, agreeable to evaluation at this time. Prior to admission the pt was mobilizing with use of rollator in the home, per family report his mobility has been gradually declining with more frequent falls and instances of instability, especially at night when he is home alone. The pt now presents with limitations in functional mobility, strength, power, dynamic stability, and activity tolerance due to above dx, and will continue to benefit from skilled PT to address these deficits. He requires minA to complete bed mobility and sit-stand transfers, as well as minA to manage balance and movement of RW with short bout of gait in the room. The pt was able to complete session with VSS on 1L O2, but is limited by fatigue and poor vision (chronic due to glaucoma). He is at significant risk of falls and does not have the caregivers or support available to safely return home. Will benefit from SNF rehab at d/c to maximize functional independence and safety with OOB mobility. Pt's daughters present and in agreement with plan.       Recommendations for follow up therapy are one component of a multi-disciplinary discharge planning process, led by the attending physician.  Recommendations may be updated based on patient status, additional functional criteria and insurance authorization.  Follow Up Recommendations Skilled nursing-short term rehab (<3 hours/day) Can patient physically be transported by private vehicle: Yes     Assistance Recommended at Discharge Frequent or constant Supervision/Assistance  Patient can return home with the following  A lot of help with walking and/or transfers;A lot of help with bathing/dressing/bathroom;Assistance with cooking/housework;Direct supervision/assist for medications management;Direct supervision/assist for financial management;Assist for transportation;Help with stairs or ramp for entrance    Equipment Recommendations  (defer to post acute)  Recommendations for Other Services       Functional Status Assessment Patient has had a recent decline in their functional status and demonstrates the ability to make significant improvements in function in a reasonable and predictable amount of time.     Precautions / Restrictions Precautions Precautions: Fall Precaution Comments: watch O2, stable on 1L through session Restrictions Weight Bearing Restrictions: No      Mobility  Bed Mobility Overal bed mobility: Needs Assistance Bed Mobility: Supine to Sit     Supine to sit: Min assist     General bed mobility comments: trunk support and balance coming to EOB, increased time and effort    Transfers Overall transfer level: Needs assistance Equipment used: Rolling walker (2 wheels) Transfers: Sit to/from Stand Sit to Stand: Min assist, From elevated surface           General transfer comment: minA to rise from elevated EOB, cues for hand placement and minA to steady    Ambulation/Gait Ambulation/Gait assistance: Min assist Gait Distance (Feet): 12 Feet Assistive device: Rolling walker (2 wheels) Gait Pattern/deviations: Step-through pattern, Decreased stride length, Shuffle, Trunk flexed, Narrow base of support Gait velocity: decreased Gait velocity interpretation: <1.31 ft/sec, indicative of household ambulator   General Gait Details: small steps, limited by poor  vision and pt needing max cues for direction and assist for lien management. No overt LOB  but minA to generally stay upright and manage RW    Balance Overall balance assessment: Needs assistance Sitting-balance support: No upper extremity supported, Feet supported Sitting balance-Leahy Scale: Fair Sitting balance - Comments: min guard to close supervision statically, up to min assist for attempts at LB dressing   Standing balance support: Bilateral upper extremity supported, During functional activity Standing balance-Leahy Scale: Poor Standing balance comment: posterior lean and relies on BUE and external support                             Pertinent Vitals/Pain Pain Assessment Pain Assessment: No/denies pain    Home Living Family/patient expects to be discharged to:: Private residence Living Arrangements: Alone Available Help at Discharge: Personal care attendant;Family;Available PRN/intermittently (caregiver during day 9am-8pm, alone at night) Type of Home: House Home Access: Stairs to enter Entrance Stairs-Rails: Right Entrance Stairs-Number of Steps: 3   Home Layout: Two level;Able to live on main level with bedroom/bathroom Home Equipment: Grab bars - tub/shower;Shower Land (2 wheels);Cane - single point;Rollator (4 wheels);BSC/3in1 Additional Comments: Caregivers 5-7 days/week, 9-2 on sat, and sun; 9-8 M-F    Prior Function Prior Level of Function : Needs assist;Patient poor historian/Family not available;History of Falls (last six months)             Mobility Comments: using rollator or walker for mobility for the last few weeks but reports prior to independent- no falls in the last 6 months. per daughters present, pt has had falls and near-falls ADLs Comments: Caregiver assists with meds, assists with cooking, driving, grocery shopping, laundry due to pt low vision. Assists with picking clothes out, but pt able to dress himself, bathe self and toilet without assist     Hand Dominance   Dominant Hand: Right     Extremity/Trunk Assessment   Upper Extremity Assessment Upper Extremity Assessment: Defer to OT evaluation    Lower Extremity Assessment Lower Extremity Assessment: Generalized weakness    Cervical / Trunk Assessment Cervical / Trunk Assessment: Normal  Communication   Communication: No difficulties  Cognition Arousal/Alertness: Awake/alert Behavior During Therapy: WFL for tasks assessed/performed Overall Cognitive Status: Impaired/Different from baseline Area of Impairment: Memory, Problem solving, Awareness, Safety/judgement, Following commands                     Memory: Decreased short-term memory Following Commands: Follows one step commands consistently, Follows one step commands with increased time, Follows multi-step commands inconsistently Safety/Judgement: Decreased awareness of safety Awareness: Emergent Problem Solving: Slow processing, Difficulty sequencing, Requires verbal cues General Comments: patient oriented, follows simple commands but difficulty with mulitple step. Family reports different PLOF compared to patient and question pts history.        General Comments General comments (skin integrity, edema, etc.): VSS on 1L with SpO2 in 90s through session    Exercises General Exercises - Lower Extremity Heel Slides: Strengthening, Both, 10 reps, Supine (against min resistance) Straight Leg Raises: AROM, Both, 5 reps, Supine   Assessment/Plan    PT Assessment Patient needs continued PT services  PT Problem List Decreased strength;Decreased activity tolerance;Decreased balance;Decreased mobility;Decreased cognition;Decreased safety awareness       PT Treatment Interventions DME instruction;Gait training;Stair training;Functional mobility training;Therapeutic activities;Therapeutic exercise;Balance training;Patient/family education    PT Goals (Current goals can be found in the Care Plan section)  Acute  Rehab PT Goals Patient Stated Goal: return  home, remain independent PT Goal Formulation: With patient Time For Goal Achievement: 09/08/22 Potential to Achieve Goals: Good    Frequency Min 3X/week        AM-PAC PT "6 Clicks" Mobility  Outcome Measure Help needed turning from your back to your side while in a flat bed without using bedrails?: A Little Help needed moving from lying on your back to sitting on the side of a flat bed without using bedrails?: A Little Help needed moving to and from a bed to a chair (including a wheelchair)?: A Little Help needed standing up from a chair using your arms (e.g., wheelchair or bedside chair)?: A Little Help needed to walk in hospital room?: Total Help needed climbing 3-5 steps with a railing? : Total 6 Click Score: 14    End of Session Equipment Utilized During Treatment: Gait belt;Oxygen Activity Tolerance: Patient tolerated treatment well Patient left: in chair;with call bell/phone within reach;with chair alarm set;with family/visitor present Nurse Communication: Mobility status PT Visit Diagnosis: Unsteadiness on feet (R26.81);Other abnormalities of gait and mobility (R26.89);Repeated falls (R29.6);Muscle weakness (generalized) (M62.81)    Time: 1310-1350 PT Time Calculation (min) (ACUTE ONLY): 40 min   Charges:   PT Evaluation $PT Eval Moderate Complexity: 1 Mod PT Treatments $Gait Training: 8-22 mins $Therapeutic Exercise: 8-22 mins        [place], PT, DPT   Acute Rehabilitation Department  Sandra Cockayne 08/25/2022, 2:54 PM

## 2022-08-26 DIAGNOSIS — N1831 Chronic kidney disease, stage 3a: Secondary | ICD-10-CM | POA: Diagnosis not present

## 2022-08-26 DIAGNOSIS — G4733 Obstructive sleep apnea (adult) (pediatric): Secondary | ICD-10-CM

## 2022-08-26 DIAGNOSIS — I10 Essential (primary) hypertension: Secondary | ICD-10-CM | POA: Diagnosis not present

## 2022-08-26 DIAGNOSIS — N179 Acute kidney failure, unspecified: Secondary | ICD-10-CM

## 2022-08-26 DIAGNOSIS — I5032 Chronic diastolic (congestive) heart failure: Secondary | ICD-10-CM | POA: Diagnosis not present

## 2022-08-26 DIAGNOSIS — I5031 Acute diastolic (congestive) heart failure: Secondary | ICD-10-CM

## 2022-08-26 DIAGNOSIS — I480 Paroxysmal atrial fibrillation: Secondary | ICD-10-CM

## 2022-08-26 LAB — RESPIRATORY PANEL BY PCR

## 2022-08-26 LAB — CBC WITH DIFFERENTIAL/PLATELET
Abs Immature Granulocytes: 0.06 10*3/uL (ref 0.00–0.07)
Basophils Absolute: 0 10*3/uL (ref 0.0–0.1)
Basophils Relative: 0 %
Eosinophils Absolute: 0 10*3/uL (ref 0.0–0.5)
Eosinophils Relative: 0 %
HCT: 37.9 % — ABNORMAL LOW (ref 39.0–52.0)
Hemoglobin: 12.8 g/dL — ABNORMAL LOW (ref 13.0–17.0)
Immature Granulocytes: 1 %
Lymphocytes Relative: 4 %
Lymphs Abs: 0.3 10*3/uL — ABNORMAL LOW (ref 0.7–4.0)
MCH: 30.5 pg (ref 26.0–34.0)
MCHC: 33.8 g/dL (ref 30.0–36.0)
MCV: 90.2 fL (ref 80.0–100.0)
Monocytes Absolute: 0.3 10*3/uL (ref 0.1–1.0)
Monocytes Relative: 5 %
Neutro Abs: 5.7 10*3/uL (ref 1.7–7.7)
Neutrophils Relative %: 90 %
Platelets: 227 10*3/uL (ref 150–400)
RBC: 4.2 MIL/uL — ABNORMAL LOW (ref 4.22–5.81)
RDW: 15.1 % (ref 11.5–15.5)
WBC: 6.3 10*3/uL (ref 4.0–10.5)
nRBC: 0 % (ref 0.0–0.2)

## 2022-08-26 LAB — RESP PANEL BY RT-PCR (FLU A&B, COVID) ARPGX2
Influenza A by PCR: NEGATIVE
Influenza B by PCR: NEGATIVE
SARS Coronavirus 2 by RT PCR: NEGATIVE

## 2022-08-26 LAB — COMPREHENSIVE METABOLIC PANEL
ALT: 18 U/L (ref 0–44)
AST: 23 U/L (ref 15–41)
Albumin: 3.1 g/dL — ABNORMAL LOW (ref 3.5–5.0)
Alkaline Phosphatase: 54 U/L (ref 38–126)
Anion gap: 12 (ref 5–15)
BUN: 41 mg/dL — ABNORMAL HIGH (ref 8–23)
CO2: 22 mmol/L (ref 22–32)
Calcium: 8.4 mg/dL — ABNORMAL LOW (ref 8.9–10.3)
Chloride: 100 mmol/L (ref 98–111)
Creatinine, Ser: 1.54 mg/dL — ABNORMAL HIGH (ref 0.61–1.24)
GFR, Estimated: 45 mL/min — ABNORMAL LOW (ref >60)
Glucose, Bld: 172 mg/dL — ABNORMAL HIGH (ref 70–99)
Potassium: 4 mmol/L (ref 3.5–5.1)
Sodium: 134 mmol/L — ABNORMAL LOW (ref 135–145)
Total Bilirubin: 0.7 mg/dL (ref 0.3–1.2)
Total Protein: 6.2 g/dL — ABNORMAL LOW (ref 6.5–8.1)

## 2022-08-26 LAB — MAGNESIUM: Magnesium: 2.5 mg/dL — ABNORMAL HIGH (ref 1.7–2.4)

## 2022-08-26 LAB — PHOSPHORUS: Phosphorus: 3.5 mg/dL (ref 2.5–4.6)

## 2022-08-26 LAB — GLUCOSE, CAPILLARY: Glucose-Capillary: 180 mg/dL — ABNORMAL HIGH (ref 70–99)

## 2022-08-26 MED ORDER — INSULIN ASPART 100 UNIT/ML IJ SOLN: 0.0000 [IU] | Freq: Every day | INTRAMUSCULAR | Status: DC

## 2022-08-26 MED ORDER — EMPAGLIFLOZIN 10 MG PO TABS
10.0000 mg | ORAL_TABLET | Freq: Every day | ORAL | Status: DC
  Administered 2022-08-26 – 2022-08-29 (×4): 10 mg via ORAL
  Filled 2022-08-26 (×4): qty 1

## 2022-08-26 MED ORDER — FUROSEMIDE 40 MG PO TABS
60.0000 mg | ORAL_TABLET | Freq: Two times a day (BID) | ORAL | Status: DC
  Administered 2022-08-26 – 2022-08-28 (×4): 60 mg via ORAL
  Filled 2022-08-26 (×4): qty 1

## 2022-08-26 MED ORDER — INSULIN ASPART 100 UNIT/ML IJ SOLN
0.0000 [IU] | Freq: Three times a day (TID) | INTRAMUSCULAR | Status: DC
  Administered 2022-08-27 – 2022-08-28 (×2): 2 [IU] via SUBCUTANEOUS
  Administered 2022-08-28: 1 [IU] via SUBCUTANEOUS
  Administered 2022-08-28: 3 [IU] via SUBCUTANEOUS

## 2022-08-26 NOTE — NC FL2 (Signed)
[place] LEVEL OF CARE SCREENING TOOL     IDENTIFICATION  Patient Name: Troy Kiedrowski [name] Birthdate: 1941-09-20 Sex: male Admission Date (Current Location): 08/24/2022  [facility] and [place] Number:  Herbalist and Address:  [facility], [place] [address], [place], [name] [address]      Provider Number: [id number]  Attending Physician Name and Address:  [name], MD  Relative Name and Phone Number:       Current Level of Care: Hospital Recommended Level of Care: [place] Prior Approval Number:    Date Approved/Denied:   PASRR Number: [id number]  Discharge Plan: SNF    Current Diagnoses: Patient Active Problem List   Diagnosis Date Noted   COPD exacerbation ([place]) 08/25/2022   Acute congestive heart failure ([place]) 08/24/2022   DNR (do not resuscitate) 08/24/2022   History of ERCP 04/06/2022   Anemia 04/06/2022   Upper GI bleeding 04/06/2022   Bleeding from sphincterotomy site 04/06/2022   Choledocholithiasis 04/06/2022   Vitamin B12 deficiency 02/13/2022   Gastric polyps    Duodenal diverticulum    Duodenal ulcer    Occult GI bleeding 02/12/2022   Fall at home, initial encounter 02/12/2022   Melena    Heme positive stool    History of thyrotoxicosis 01/10/2022   SOB (shortness of breath) 07/02/2021   AKI (acute kidney injury) ([place]) 07/02/2021   Hypomagnesemia 07/02/2021   COPD with acute exacerbation ([place]) 07/02/2021   Hypokalemia 07/02/2021   Hyperlipidemia    COVID-19 virus infection 07/01/2021   Chronic anticoagulation 10/18/2020   Hx of adenomatous colonic polyps 10/18/2020   History of gastric polyps 10/18/2020   Iron deficiency 10/18/2020   History of anemia 10/18/2020   Gastric intestinal metaplasia 10/18/2020   Aortic atherosclerosis ([place]) 08/01/2020   Secondary hypercoagulable state ([place]) 03/22/2020   Personal history of colon cancer 01/08/2020   Epigastric pain 01/08/2020    Calculus of gallbladder without cholecystitis without obstruction 01/08/2020   Vertigo 11/25/2019   Cancer of sigmoid colon ([name]) 12/12/2018   Pleural effusion on right 07/15/2018   Colon adenocarcinoma ([place]) 06/30/2018   Acute GI bleeding 06/23/2018   Acute blood loss anemia 06/23/2018   Depression 05/06/2018   Syncope 01/29/2018   Chronic diastolic CHF (congestive heart failure) ([place]) 05/04/2017   CAD (coronary artery disease) 05/03/2017   MGUS (monoclonal gammopathy of unknown significance) 03/07/2016   Benign essential HTN 01/19/2016   OSA (obstructive sleep apnea) 08/29/2015   PAF (paroxysmal atrial fibrillation) ([place])    Glaucoma 06/22/2015   DOE (dyspnea on exertion) 02/04/2015   Memory loss, short term 08/03/2014   Symptomatic anemia 12/28/2013   Occlusion and stenosis of carotid artery without mention of cerebral infarction 07/09/2013   COPD GOLD II  07/09/2013   Chronic renal disease, stage 3, moderately decreased glomerular filtration rate between 30-59 mL/min/1.73 square meter ([place]) 07/06/2013   TIA (transient ischemic attack) 07/06/2013   Basal cell carcinoma of skin 10/10/2012   History of lung cancer 03/11/2012   Lung nodule 03/11/2012   PAD (peripheral artery disease) ([place]) 12/16/2011   AAA (abdominal aortic aneurysm) ([place]) 09/27/2011   Squamous cell lung cancer ([place]) 11/08/2010   Allergic rhinitis 07/28/2010   GAIT IMBALANCE 03/02/2009   Secondary DM with peripheral vascular disease ([place]) 03/02/2009   HYPERPLASIA PROSTATE UNS W/O UR OBST & OTH LUTS 01/15/2008   Peripheral vascular disease ([place]) 09/29/2007   LEUKOPLAKIA, VOCAL CORDS 09/29/2007   FEMORAL BRUIT  09/29/2007    Orientation RESPIRATION BLADDER Height & Weight     Self, Time, Place  O2, Normal (2L) Continent Weight: 177 lb 7.5 oz (80.5 kg) Height:  6\' 1"  (185.4 cm)  BEHAVIORAL SYMPTOMS/MOOD NEUROLOGICAL BOWEL NUTRITION STATUS      Continent Diet (See DC summary)  AMBULATORY STATUS  COMMUNICATION OF NEEDS Skin   Extensive Assist Verbally Normal                       Personal Care Assistance Level of Assistance  Bathing, Feeding, Dressing Bathing Assistance: Limited assistance Feeding assistance: Independent Dressing Assistance: Limited assistance     Functional Limitations Info  Sight, Hearing, Speech Sight Info: Adequate Hearing Info: Adequate Speech Info: Adequate    SPECIAL CARE FACTORS FREQUENCY  PT (By licensed PT), OT (By licensed OT)     PT Frequency: 5x a week OT Frequency: 5x a week            Contractures Contractures Info: Not present    Additional Factors Info  Code Status, Allergies Code Status Info: DNR Allergies Info: Niacin-lovastatin Er, Penicillins, Sulfonamide Derivatives, Lipitor (Atorvastatin)           Current Medications (08/26/2022):  This is the current hospital active medication list Current Facility-Administered Medications  Medication Dose Route Frequency Provider Last Rate Last Admin   acetaminophen (TYLENOL) tablet 650 mg  650 mg Oral Q6H PRN Karmen Bongo, MD   650 mg at 08/26/22 0957   Or   acetaminophen (TYLENOL) suppository 650 mg  650 mg Rectal Q6H PRN Karmen Bongo, MD       albuterol (PROVENTIL) (2.5 MG/3ML) 0.083% nebulizer solution 2.5 mg  2.5 mg Nebulization Q2H PRN Karmen Bongo, MD       amLODipine (NORVASC) tablet 10 mg  10 mg Oral Daily Karmen Bongo, MD   10 mg at 08/26/22 9562   apixaban (ELIQUIS) tablet 2.5 mg  2.5 mg Oral BID Karmen Bongo, MD   2.5 mg at 08/26/22 0955   azithromycin (ZITHROMAX) tablet 500 mg  500 mg Oral Daily Karmen Bongo, MD   500 mg at 08/26/22 1308   bisacodyl (DULCOLAX) EC tablet 5 mg  5 mg Oral Daily PRN Karmen Bongo, MD   5 mg at 08/25/22 1616   brinzolamide (AZOPT) 1 % ophthalmic suspension 1 drop  1 drop Both Eyes TID Karmen Bongo, MD   1 drop at 08/26/22 0956   And   brimonidine (ALPHAGAN) 0.2 % ophthalmic solution 1 drop  1 drop Both Eyes TID  Karmen Bongo, MD   1 drop at 08/26/22 0956   empagliflozin (JARDIANCE) tablet 10 mg  10 mg Oral Daily Larey Dresser, MD       escitalopram (LEXAPRO) tablet 10 mg  10 mg Oral Daily Karmen Bongo, MD   10 mg at 08/26/22 0954   fluticasone furoate-vilanterol (BREO ELLIPTA) 200-25 MCG/ACT 1 puff  1 puff Inhalation Daily Karmen Bongo, MD   1 puff at 08/26/22 0807   furosemide (LASIX) tablet 60 mg  60 mg Oral BID Larey Dresser, MD       guaiFENesin [facility]) 12 hr tablet 600 mg  600 mg Oral BID PRN Karmen Bongo, MD       hydrALAZINE (APRESOLINE) injection 5 mg  5 mg Intravenous Q4H PRN Karmen Bongo, MD       latanoprost (XALATAN) 0.005 % ophthalmic solution 1 drop  1 drop Both Eyes QHS Karmen Bongo, MD   1  drop at 08/25/22 1938   melatonin tablet 20 mg  20 mg Oral Ivery Quale, MD   20 mg at 08/25/22 2020   metoprolol succinate (TOPROL-XL) 24 hr tablet 25 mg  25 mg Oral Daily Karmen Bongo, MD   25 mg at 08/26/22 0952   montelukast (SINGULAIR) tablet 10 mg  10 mg Oral Ivery Quale, MD   10 mg at 08/25/22 2018   ondansetron (ZOFRAN) tablet 4 mg  4 mg Oral Q6H PRN Karmen Bongo, MD       Or   ondansetron [facility]) injection 4 mg  4 mg Intravenous Q6H PRN Karmen Bongo, MD       oxyCODONE (Oxy IR/ROXICODONE) immediate release tablet 5 mg  5 mg Oral Q4H PRN Karmen Bongo, MD       pantoprazole (PROTONIX) EC tablet 40 mg  40 mg Oral Daily Karmen Bongo, MD   40 mg at 08/26/22 0953   polyethylene glycol (MIRALAX / GLYCOLAX) packet 17 g  17 g Oral Daily PRN Karmen Bongo, MD       polyvinyl alcohol (LIQUIFILM TEARS) 1.4 % ophthalmic solution 1 drop  1 drop Both Eyes Daily PRN Karmen Bongo, MD       predniSONE (DELTASONE) tablet 40 mg  40 mg Oral Q breakfast Karmen Bongo, MD   40 mg at 08/26/22 1540   rosuvastatin (CRESTOR) tablet 40 mg  40 mg Oral Daily Karmen Bongo, MD   40 mg at 08/26/22 0954   sodium chloride flush (NS) 0.9 % injection 3 mL  3 mL  Intravenous Lillia [place], MD   3 mL at 08/25/22 2034   umeclidinium bromide (INCRUSE ELLIPTA) 62.5 MCG/ACT 1 puff  1 puff Inhalation Daily Karmen Bongo, MD   1 puff at 08/26/22 0807   zolpidem (AMBIEN) tablet 5 mg  5 mg Oral QHS PRN Karmen Bongo, MD         Discharge Medications: Please see discharge summary for a list of discharge medications.  Relevant Imaging Results:  Relevant Lab Results:   Additional Information SS# [id number], pt vaccinated for covid with 2 boosters  Emeterio Reeve, LCSW

## 2022-08-26 NOTE — TOC Initial Note (Signed)
Transition of Care (TOC) - Initial/Assessment Note    Patient Details  Name: Troy Levinson [name] MRN: [id number] Date of Birth: 02-05-1941  Transition of Care [facility]) CM/SW Contact:    Emeterio Reeve, LCSW Phone Number: 08/26/2022, 12:11 PM  Clinical Narrative:                  CSW received SNF consult. CSW spoke with pts daughter [name] on phone. CSW introduced self and explained role at the hospital. [name] reports that PTA pt lived at home alone. He had a paid companion that came a few days a week to help with light cleaning and meals Pt refused to have any help with mobility of ADL's. [name] reports pt owns a rollator, walker and cane but he refuses to use them. Pt has had many near falls and is moving a lot slower than he was 3 weeks ago.   CSW reviewed PT/OT recommendations for SNF. [name] reports that she believes SNF will be beneficial. Pt gave CSW permission to fax out to facilities in the area. Pt has been to [facility] in the past and would prefer not to return. [name] reports pt is on the waitlist at [facility] but would like for his information to be sent there with the hopes of him moving to assisted living after. [name] would prefer [facility], [facility] or [facility] with the hopes to go to assisted living after SNF.   CSW gave pt medicare.gov rating list to review. CSW explained insurance auth process.  CSW will continue to follow.  Expected Discharge Plan: Skilled Nursing Facility Barriers to Discharge: Continued Medical Work up, Ship broker   Patient Goals and CMS Choice   CMS Medicare.gov Compare Post Acute Care list provided to:: Patient Choice offered to / list presented to : Patient  Expected Discharge Plan and Services Expected Discharge Plan: [place] arrangements for the past 2 months: Single Family Home                                      Prior Living Arrangements/Services Living  arrangements for the past 2 months: Single Family Home Lives with:: Self Patient language and need for interpreter reviewed:: Yes Do you feel safe going back to the place where you live?: Yes      Need for Family Participation in Patient Care: Yes (Comment) Care giver support system in place?: Yes (comment) Current home services: Housekeeping, Actuary Criminal Activity/Legal Involvement Pertinent to Current Situation/Hospitalization: No - Comment as needed  Activities of Daily Living      Permission Sought/Granted Permission sought to share information with : Investment banker, corporate granted to share info w AGENCY: SNF        Emotional Assessment Appearance:: Appears stated age Attitude/Demeanor/Rapport: Engaged Affect (typically observed): Appropriate Orientation: : Oriented to Self, Oriented to Place, Oriented to  Time Alcohol / Substance Use: Not Applicable Psych Involvement: No (comment)  Admission diagnosis:  Renal insufficiency [N28.9] Hypoxia [R09.02] Elevated troponin [R79.89] Acute CHF (congestive heart failure) (HCC) [I50.9] Acute on chronic congestive heart failure, unspecified heart failure type ([place]) [I50.9] COPD exacerbation ([place]) [J44.1] Patient Active Problem List   Diagnosis Date Noted   COPD exacerbation ([place]) 08/25/2022   Acute congestive heart failure ([place]) 08/24/2022   DNR (do not resuscitate) 08/24/2022   History of  ERCP 04/06/2022   Anemia 04/06/2022   Upper GI bleeding 04/06/2022   Bleeding from sphincterotomy site 04/06/2022   Choledocholithiasis 04/06/2022   Vitamin B12 deficiency 02/13/2022   Gastric polyps    Duodenal diverticulum    Duodenal ulcer    Occult GI bleeding 02/12/2022   Fall at home, initial encounter 02/12/2022   Melena    Heme positive stool    History of thyrotoxicosis 01/10/2022   SOB (shortness of breath) 07/02/2021   AKI (acute kidney injury) ([place]) 07/02/2021   Hypomagnesemia 07/02/2021    COPD with acute exacerbation ([place]) 07/02/2021   Hypokalemia 07/02/2021   Hyperlipidemia    COVID-19 virus infection 07/01/2021   Chronic anticoagulation 10/18/2020   Hx of adenomatous colonic polyps 10/18/2020   History of gastric polyps 10/18/2020   Iron deficiency 10/18/2020   History of anemia 10/18/2020   Gastric intestinal metaplasia 10/18/2020   Aortic atherosclerosis ([place]) 08/01/2020   Secondary hypercoagulable state ([place]) 03/22/2020   Personal history of colon cancer 01/08/2020   Epigastric pain 01/08/2020   Calculus of gallbladder without cholecystitis without obstruction 01/08/2020   Vertigo 11/25/2019   Cancer of sigmoid colon ([place]) 12/12/2018   Pleural effusion on right 07/15/2018   Colon adenocarcinoma ([place]) 06/30/2018   Acute GI bleeding 06/23/2018   Acute blood loss anemia 06/23/2018   Depression 05/06/2018   Syncope 01/29/2018   Chronic diastolic CHF (congestive heart failure) ([place]) 05/04/2017   CAD (coronary artery disease) 05/03/2017   MGUS (monoclonal gammopathy of unknown significance) 03/07/2016   Benign essential HTN 01/19/2016   OSA (obstructive sleep apnea) 08/29/2015   PAF (paroxysmal atrial fibrillation) ([place])    Glaucoma 06/22/2015   DOE (dyspnea on exertion) 02/04/2015   Memory loss, short term 08/03/2014   Symptomatic anemia 12/28/2013   Occlusion and stenosis of carotid artery without mention of cerebral infarction 07/09/2013   COPD GOLD II  07/09/2013   Chronic renal disease, stage 3, moderately decreased glomerular filtration rate between 30-59 mL/min/1.73 square meter ([place]) 07/06/2013   TIA (transient ischemic attack) 07/06/2013   Basal cell carcinoma of skin 10/10/2012   History of lung cancer 03/11/2012   Lung nodule 03/11/2012   PAD (peripheral artery disease) ([place]) 12/16/2011   AAA (abdominal aortic aneurysm) ([place]) 09/27/2011   Squamous cell lung cancer ([place]) 11/08/2010   Allergic rhinitis 07/28/2010   GAIT IMBALANCE 03/02/2009    Secondary DM with peripheral vascular disease ([place]) 03/02/2009   HYPERPLASIA PROSTATE UNS W/O UR OBST & OTH LUTS 01/15/2008   Peripheral vascular disease ([place]) 09/29/2007   LEUKOPLAKIA, VOCAL CORDS 09/29/2007   FEMORAL BRUIT 09/29/2007   PCP:  Binnie Rail, MD Pharmacy:   [facility] [place] [address] Phone: [phone] Fax: [phone]  [facility] [address] [place] [address] Phone: [phone] Fax: [phone]     Social Determinants of Health (SDOH) Interventions    Readmission Risk Interventions    07/04/2021    3:08 PM 07/04/2021    1:14 PM  Readmission Risk Prevention Plan  Transportation Screening Complete Complete  Medication Review (RN Care Manager) Referral to Pharmacy Referral to Pharmacy  PCP or Specialist appointment within 3-5 days of discharge Complete Complete  HRI or [place] Complete Complete  SW Recovery Care/Counseling Consult Complete   Palliative Care Screening Not [place] Patient Refused    Emeterio Reeve,  LCSW Clinical Education officer, museum

## 2022-08-26 NOTE — Progress Notes (Signed)
[facility] PROGRESS NOTE  Troy Corbello [name] (DOB: 1941/01/03) TXM:[id number] PCP: Binnie Rail, MD  Brief Narrative: Troy Kindred [name] is an 81 y.o. male with a history of Afib on eliquis; chronic HFpEF; stage 3 CKD; colon and lung cancer; COPD; glaucoma; HTN; HLD; PVD s/p stent; and OSA on CPAP who presented 11/24 with shortness of breath and peripheral swelling found to be hypoxemic. Treatment for acute hypoxic respiratory failure and AECOPD initiated on admission.   Subjective: Headaches are overall improved, usually worse in the morning over the past several weeks. Thinks his vision has been getting worse for months, though optometry evaluation showed his vision wasn't very much hanged from prior. No further nausea, vomiting or diarrhea. Does not have breathing complaints.   Objective: BP (!) 155/70 (BP Location: Left Arm)   Pulse 65   Temp 97.7 F (36.5 C) (Oral)   Resp 18   Ht 6\' 1"  (1.854 m)   Wt 80.5 kg   SpO2 96%   BMI 23.41 kg/m   Gen: 81yo M in no distress Pulm: Diminished at right base but no wheezes or crackles  CV: RRR with occasional PVCs on monitor, trace pitting LE edema GI: Soft, NT, ND, +BS. No bruit or mass Neuro: Alert and oriented. Impaired visual acuity. No new focal deficits. Ext: Warm, no deformities Skin: No rashes, lesions or ulcers on visualized skin   Assessment & Plan: Acute hypoxic respiratory failure due to AECOPD: CXR with chronic right pleural effusion/scarring, no acute cardiopulm process - Check RVP, covid, flu PCR with lymphopenia and other nonspecific viral symptoms.  - Continue supplemental oxygen for SpO2 < 90% and/or elevated WOB. May need this newly at discharge (severity of acute component vs. progressive chronic component unclear).   - Continue steroids thru 11/28 - Continue azithromycin (11/24 - 11/28) - Continue breztri, singulair, scheduled duonebs, prn albuterol - Coordinated care with TOC team/PT/OT/Nutrition/RT  consults   AKI on stage 3a CKD: Improved. Cr 1.95 > 1.54 - Resume lasix and jardiance with CrCl > 63ml/min. Hold ARB.    Chronic HFpEF, HTN: Echo this admit not significantly changed from prior. EF 50-55%, LV endocardial border not optimally defined to evaluate regional wall motion, IVC dilated with <50% resp variability  - Cardiology consulted, [name] following today, appreciate insights. Will restart lasix, jardiance.  - Continue norvasc  PAF: s/p ablation July 2020, May 2021, had recurrence and said to be poor candidate for redo ablation per EP. s/p DCCV Oct 2023   - Continue metoprolol  - Continue dose-reduced eliquis (age > 88, Cr > 1.5 though is very close to this cutoff).  - Avoid amiodarone due to concern for pulmonary toxicity.  - Tikosyn not ideal with renal dysfunction.   CAD, demand myocardial ischemia: Mildly elevated Tn at admission in setting of hypoxemia. No anginal chest pain. Nonobstructive disease at last cath.  - Continue metoprolol, anticoagulation, rosuvastatin 40mg . Last LDL is 34. - Defer further work up at this time per cardiology.  History of squamous cell lung cancer s/p right lower lobectomy with chronic right pleural effusion:  - Continue outpatient follow up with Dr. Valeta Harms (also following a non-avid by PET RUL nodule).   Headache: Had head CT for this on presentation (without acute findings). No current complaints.  - Continue monitoring    HTN -Hold losartan -Continue metprolol, amlodipine   HLD - Continue crestor   T2DM: HbA1c 6.8% indicating good control.  - Restarting jardiance.  - CBGs 160-170's  here, will start sensitive SSI.   Glaucoma: With advanced visual impairment - Continue latanoprost, simbrinza, ophthalmology follow up.   AAA: PET scan showed 4.2cm July 2023. By U/S it was 3.7cm March 2023, was 3.3cm Jan 2019.  - Follow up with vascular surgery.     PAD: s/p patch angioplasty to left fem-pop bypass in 11/16. Peripheral arterial  dopplers in 3/23 with >50% right iliac stenosis, but no rest pain or pedal ulcerations.  - Follow up with [facility]  Carotid stenosis: s/p right CEA.  - Follow up with [facility]  Mood disorder: Quiescent - Continue lexapro, melatonin   OSA - Continue CPAP   DNR: POA per admitting MD.    History of hyperthyroidism: Thought to be due to amiodarone which was stopped. Pt no longer on methimazole, last TSH 1.69 earlier this year.   History of sigmoid adenocarcinoma: s/p resection.   History of duodenal ulcer bleeding: No bleeding noted currently.   [name], MD [facility] [email or web address] 08/26/2022, 4:14 PM

## 2022-08-26 NOTE — Progress Notes (Signed)
Mobility Specialist Progress Note    08/26/22 1104  Mobility  Activity Ambulated with assistance in hallway  Level of Assistance Contact guard assist, steadying assist  Assistive Device Front wheel walker  Distance Ambulated (ft) 80 ft  Activity Response Tolerated well  Mobility Referral Yes  $Mobility charge 1 Mobility   Pre-Mobility: 64 HR, 149/67 (92) BP, 96% SpO2 Post-Mobility: 60 HR, 91% SpO2  Pt received in chair and agreeable. No complaints. SpO2 as low as 86% on RA and recovered to >/=90% on 2LO2. Rolled back to room in chair. Left with call bell in reach and daughter present.   Hildred Alamin Mobility Specialist  Please Psychologist, sport and exercise or Rehab Office at [phone]

## 2022-08-26 NOTE — Progress Notes (Signed)
Nurse requested Mobility Specialist to perform oxygen saturation test with pt which includes removing pt from oxygen both at rest and while ambulating.  Below are the results from that testing.     Patient Saturations on Room Air at Rest = spO2 86%  Patient Saturations on 2 Liters of oxygen while Ambulating = sp02 >/=90%  At end of testing pt left in room on 2  Liters of oxygen.  Reported results to nurse.

## 2022-08-26 NOTE — Progress Notes (Signed)
Patient ID: Troy Cubbage [name], male   DOB: 1940/10/11, 81 y.o.   MRN: [id number]     Advanced Heart Failure Rounding Note  PCP-Cardiologist: [name], MD   Subjective:    Breathing better today, on oxygen.   He remains in NSR.    Objective:   Weight Range: 80.5 kg Body mass index is 23.41 kg/m.   Vital Signs:   Temp:  [97.4 F (36.3 C)-97.9 F (36.6 C)] 97.9 F (36.6 C) (11/26 0911) Pulse Rate:  [65-275] 78 (11/26 0952) Resp:  [16-20] 18 (11/26 0911) BP: (139-162)/(67-114) 156/67 (11/26 0911) SpO2:  [91 %-97 %] 96 % (11/26 0911) Last BM Date : 08/23/22  Weight change: Filed Weights   08/24/22 0149 08/24/22 1822  Weight: 85.3 kg 80.5 kg    Intake/Output:   Intake/Output Summary (Last 24 hours) at 08/26/2022 1049 Last data filed at 08/26/2022 0718 Gross per 24 hour  Intake 90 ml  Output 1800 ml  Net -1710 ml      Physical Exam    General:  Well appearing. No resp difficulty HEENT: Normal Neck: Supple. JVP not elevated. Carotids 2+ bilat; no bruits. No lymphadenopathy or thyromegaly appreciated. Cor: PMI nondisplaced. Regular rate & rhythm. No rubs, gallops or murmurs. Lungs: Distant BS, decreased at right base.  Abdomen: Soft, nontender, nondistended. No hepatosplenomegaly. No bruits or masses. Good bowel sounds. Extremities: No cyanosis, clubbing, rash, edema Neuro: Alert & orientedx3, cranial nerves grossly intact. moves all 4 extremities w/o difficulty. Affect pleasant   Telemetry   NSR 60s (personally reviewed)  Labs    CBC Recent Labs    08/24/22 0137 08/25/22 0200 08/26/22 0215  WBC 5.5 6.5 6.3  NEUTROABS 5.0  --  5.7  HGB 16.9 13.0 12.8*  HCT 50.0 39.3 37.9*  MCV 91.6 90.8 90.2  PLT 264 217 564   Basic Metabolic Panel Recent Labs    08/25/22 0200 08/26/22 0215  NA 137 134*  K 4.0 4.0  CL 103 100  CO2 22 22  GLUCOSE 164* 172*  BUN 34* 41*  CREATININE 1.70* 1.54*  CALCIUM 8.8* 8.4*  MG  --  2.5*  PHOS  --  3.5    Liver Function Tests Recent Labs    08/26/22 0215  AST 23  ALT 18  ALKPHOS 54  BILITOT 0.7  PROT 6.2*  ALBUMIN 3.1*   No results for input(s): "LIPASE", "AMYLASE" in the last 72 hours. Cardiac Enzymes No results for input(s): "CKTOTAL", "CKMB", "CKMBINDEX", "TROPONINI" in the last 72 hours.  BNP: BNP (last 3 results) Recent Labs    07/31/22 1444 08/14/22 1619 08/24/22 0137  BNP 227.9* 186.5* 256.7*    ProBNP (last 3 results) No results for input(s): "PROBNP" in the last 8760 hours.   D-Dimer No results for input(s): "DDIMER" in the last 72 hours. Hemoglobin A1C No results for input(s): "HGBA1C" in the last 72 hours. Fasting Lipid Panel No results for input(s): "CHOL", "HDL", "LDLCALC", "TRIG", "CHOLHDL", "LDLDIRECT" in the last 72 hours. Thyroid Function Tests No results for input(s): "TSH", "T4TOTAL", "T3FREE", "THYROIDAB" in the last 72 hours.  Invalid input(s): "FREET3"  Other results:   Imaging    No results found.   Medications:     Scheduled Medications:  amLODipine  10 mg Oral Daily   apixaban  2.5 mg Oral BID   azithromycin  500 mg Oral Daily   brinzolamide  1 drop Both Eyes TID   And   brimonidine  1 drop Both Eyes  TID   escitalopram  10 mg Oral Daily   fluticasone furoate-vilanterol  1 puff Inhalation Daily   furosemide  80 mg Oral Daily   And   furosemide  60 mg Oral q1800   latanoprost  1 drop Both Eyes QHS   melatonin  20 mg Oral QHS   metoprolol succinate  25 mg Oral Daily   montelukast  10 mg Oral QHS   pantoprazole  40 mg Oral Daily   predniSONE  40 mg Oral Q breakfast   rosuvastatin  40 mg Oral Daily   sodium chloride flush  3 mL Intravenous Q12H   umeclidinium bromide  1 puff Inhalation Daily    Infusions:   PRN Medications: acetaminophen **OR** acetaminophen, albuterol, bisacodyl, guaiFENesin, hydrALAZINE, ondansetron **OR** ondansetron (ZOFRAN) IV, oxyCODONE, polyethylene glycol, polyvinyl alcohol,  zolpidem   Assessment/Plan   1. Chronic diastolic CHF: Echo in 01/74 with EF 55-60%, mild LVH, mildly dysfunctional RV.  Echo in 11/23 with EF 50-55%, RV normal, IVC dilated. On exam today, he does not look particularly volume overloaded. Creatinine down to baseline, 1.54.  - Restart home Lasix 60 mg bid.   - Restart Jardiance 10 mg daily. No GU symptoms. 2. Hyperlipidemia: 1/23 lipids with good LDL. - Continue Crestor 40 mg daily.  - Continue Vascepa 2 g bid. 3. Carotid stenosis: s/p R CEA.  Dopplers followed at [facility].  4. PAD: s/p patch angioplasty to left fem-pop bypass in 11/16.  Followed at [facility].  Peripheral arterial dopplers in 3/23 with >50% right iliac stenosis, but no rest pain or pedal ulcerations.  Rare mild claudication.  - Follows with [facility].  5. AAA: Stable on last [name], followed at [facility].   6. AECOPD: No longer smoking. PFTs in 1/18 showed moderately severe obstructive defect. CT chest in 11/18 showed emphysema.  His oxygen saturation was low at admission, suspect due to progression of COPD vs acute exacerbation.  He is being treated for COPD exacerbation with azithromycin, nebs, and prednisone.  - Check respiratory virus panel.  - Continue prednisone, nebs, azithromycin per primary service.  - I suspect he will need home oxygen.   7. Atrial fibrillation: S/p atrial fibrillation ablation in 7/20 and redo in 5/21.  He has been primarily in NSR since redo ablation.  Back in slow atrial fibrillation 9/23 and s/p DCCV 10/23 to NSR. In NSR on ECG today.  He saw EP recently, poor candidate for redo ablation.  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 GFR would limit Tikosyn to the lowest dose, probably not a good choice for him.  - Continue Toprol XL 25 mg daily.  - Continue Eliquis 2.5 mg bid. (Reduced dose with creatinine and age). - Continue to use CPAP.  8. CKD: Stage 3.  Creatinine 1.54 today is at his baseline.  9. CAD: LHC in 11/18  with nonobstructive disease only.  Cardiolite in 6/21 with no ischemia/infarction. No chest pain. HS-TnI mildly elevated at admission with no trend, ?demand ischemia due to hypoxemia in setting of COPD exacerbation.  10. 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  He is no longer on methimazole, follows with endocrinology.     11. Colon cancer: Sigmoid adenocarcinoma, s/p surgical resection. In remission.  12. HTN: Continue amlodipine, BP remains mildly elevated.  13. Pleural effusion: Chronic right pleural effusion, s/p right lower lobectomy.   14. Squamous cell lung cancer: S/p right lower lobectomy.  Follows with Dr. Valeta Harms.  15. Upper GI bleeding: In 5/23 due  to duodenal ulcer. No abnormal bleeding. - hgb stable.  16. Neurology: Persistent HA, CT normal.   Discussion today with patient's daughter.  I do not think he can live on his own.  He will need home oxygen.  He will need rehab stay at discharge then think he will need long-term SNF.   Length of Stay: 1  [name], MD  08/26/2022, 10:49 AM  Advanced Heart Failure Team Pager [phone] (M-F; 7a - 5p)  Please contact [facility] for night-coverage after hours (5p -7a ) and weekends on [email or web address]

## 2022-08-26 NOTE — Progress Notes (Signed)
Patient setup on nasal cpap with 2L oxygen.  Patient tolerating well at this time.

## 2022-08-27 ENCOUNTER — Inpatient Hospital Stay (HOSPITAL_COMMUNITY): Payer: PPO

## 2022-08-27 DIAGNOSIS — I5032 Chronic diastolic (congestive) heart failure: Secondary | ICD-10-CM | POA: Diagnosis not present

## 2022-08-27 DIAGNOSIS — I10 Essential (primary) hypertension: Secondary | ICD-10-CM | POA: Diagnosis not present

## 2022-08-27 DIAGNOSIS — N1831 Chronic kidney disease, stage 3a: Secondary | ICD-10-CM | POA: Diagnosis not present

## 2022-08-27 DIAGNOSIS — N179 Acute kidney failure, unspecified: Secondary | ICD-10-CM | POA: Diagnosis not present

## 2022-08-27 DIAGNOSIS — I5043 Acute on chronic combined systolic (congestive) and diastolic (congestive) heart failure: Secondary | ICD-10-CM

## 2022-08-27 LAB — BASIC METABOLIC PANEL
Anion gap: 14 (ref 5–15)
BUN: 36 mg/dL — ABNORMAL HIGH (ref 8–23)
CO2: 23 mmol/L (ref 22–32)
Calcium: 8.6 mg/dL — ABNORMAL LOW (ref 8.9–10.3)
Chloride: 98 mmol/L (ref 98–111)
Creatinine, Ser: 1.4 mg/dL — ABNORMAL HIGH (ref 0.61–1.24)
GFR, Estimated: 50 mL/min — ABNORMAL LOW (ref >60)
Glucose, Bld: 132 mg/dL — ABNORMAL HIGH (ref 70–99)
Potassium: 3.4 mmol/L — ABNORMAL LOW (ref 3.5–5.1)
Sodium: 135 mmol/L (ref 135–145)

## 2022-08-27 LAB — GLUCOSE, CAPILLARY
Glucose-Capillary: 122 mg/dL — ABNORMAL HIGH (ref 70–99)
Glucose-Capillary: 126 mg/dL — ABNORMAL HIGH (ref 70–99)
Glucose-Capillary: 200 mg/dL — ABNORMAL HIGH (ref 70–99)
Glucose-Capillary: 128 mg/dL — ABNORMAL HIGH (ref 70–99)

## 2022-08-27 MED ORDER — ALBUTEROL SULFATE (2.5 MG/3ML) 0.083% IN NEBU: 2.5000 mg | INHALATION_SOLUTION | Freq: Once | RESPIRATORY_TRACT | Status: DC

## 2022-08-27 MED ORDER — SPIRONOLACTONE 12.5 MG HALF TABLET
12.5000 mg | ORAL_TABLET | Freq: Every day | ORAL | Status: DC
  Administered 2022-08-27: 12.5 mg via ORAL
  Filled 2022-08-27 (×2): qty 1

## 2022-08-27 MED ORDER — POTASSIUM CHLORIDE CRYS ER 20 MEQ PO TBCR
20.0000 meq | EXTENDED_RELEASE_TABLET | Freq: Once | ORAL | Status: AC
  Administered 2022-08-27: 20 meq via ORAL
  Filled 2022-08-27: qty 1

## 2022-08-27 NOTE — Plan of Care (Signed)

## 2022-08-27 NOTE — Progress Notes (Addendum)
Patient ID: Troy Waymire [name], male   DOB: 06/01/1941, 81 y.o.   MRN: [id number]     Advanced Heart Failure Rounding Note  PCP-Cardiologist: [name], MD   Subjective:    Yesterday lasix and jardiance restarted.   Had a hard time sleeping with CPAP.  Objective:   Weight Range: 80.5 kg Body mass index is 23.41 kg/m.   Vital Signs:   Temp:  [96.6 F (35.9 C)-97.9 F (36.6 C)] 96.6 F (35.9 C) (11/27 0530) Pulse Rate:  [60-78] 60 (11/27 0530) Resp:  [16-18] 16 (11/27 0530) BP: (127-158)/(66-78) 127/71 (11/27 0530) SpO2:  [93 %-96 %] 93 % (11/27 0742) Last BM Date : 08/23/22  Weight change: Filed Weights   08/24/22 0149 08/24/22 1822  Weight: 85.3 kg 80.5 kg    Intake/Output:   Intake/Output Summary (Last 24 hours) at 08/27/2022 0747 Last data filed at 08/27/2022 0700 Gross per 24 hour  Intake 120 ml  Output 2200 ml  Net -2080 ml      Physical Exam    General:  In bed.  No resp difficulty HEENT: normal Neck: supple. no JVD. Carotids 2+ bilat; no bruits. No lymphadenopathy or thryomegaly appreciated. Cor: PMI nondisplaced. Regular rate & rhythm. No rubs, gallops or murmurs. Lungs: EW on 6 liters [place]  Abdomen: soft, nontender, nondistended. No hepatosplenomegaly. No bruits or masses. Good bowel sounds. Extremities: no cyanosis, clubbing, rash, edema Neuro: alert & orientedx3, cranial nerves grossly intact. moves all 4 extremities w/o difficulty. Affect pleasant   Telemetry  SR /SB with PACs. 50-60s   Labs    CBC Recent Labs    08/25/22 0200 08/26/22 0215  WBC 6.5 6.3  NEUTROABS  --  5.7  HGB 13.0 12.8*  HCT 39.3 37.9*  MCV 90.8 90.2  PLT 217 938   Basic Metabolic Panel Recent Labs    08/26/22 0215 08/27/22 0624  NA 134* 135  K 4.0 3.4*  CL 100 98  CO2 22 23  GLUCOSE 172* 132*  BUN 41* 36*  CREATININE 1.54* 1.40*  CALCIUM 8.4* 8.6*  MG 2.5*  --   PHOS 3.5  --    Liver Function Tests Recent Labs    08/26/22 0215  AST 23   ALT 18  ALKPHOS 54  BILITOT 0.7  PROT 6.2*  ALBUMIN 3.1*   No results for input(s): "LIPASE", "AMYLASE" in the last 72 hours. Cardiac Enzymes No results for input(s): "CKTOTAL", "CKMB", "CKMBINDEX", "TROPONINI" in the last 72 hours.  BNP: BNP (last 3 results) Recent Labs    07/31/22 1444 08/14/22 1619 08/24/22 0137  BNP 227.9* 186.5* 256.7*    ProBNP (last 3 results) No results for input(s): "PROBNP" in the last 8760 hours.   D-Dimer No results for input(s): "DDIMER" in the last 72 hours. Hemoglobin A1C No results for input(s): "HGBA1C" in the last 72 hours. Fasting Lipid Panel No results for input(s): "CHOL", "HDL", "LDLCALC", "TRIG", "CHOLHDL", "LDLDIRECT" in the last 72 hours. Thyroid Function Tests No results for input(s): "TSH", "T4TOTAL", "T3FREE", "THYROIDAB" in the last 72 hours.  Invalid input(s): "FREET3"  Other results:   Imaging    No results found.   Medications:     Scheduled Medications:  amLODipine  10 mg Oral Daily   apixaban  2.5 mg Oral BID   azithromycin  500 mg Oral Daily   brinzolamide  1 drop Both Eyes TID   And   brimonidine  1 drop Both Eyes TID   empagliflozin  10 mg  Oral Daily   escitalopram  10 mg Oral Daily   fluticasone furoate-vilanterol  1 puff Inhalation Daily   furosemide  60 mg Oral BID   insulin aspart  0-5 Units Subcutaneous QHS   insulin aspart  0-9 Units Subcutaneous TID WC   latanoprost  1 drop Both Eyes QHS   melatonin  20 mg Oral QHS   metoprolol succinate  25 mg Oral Daily   montelukast  10 mg Oral QHS   pantoprazole  40 mg Oral Daily   predniSONE  40 mg Oral Q breakfast   rosuvastatin  40 mg Oral Daily   sodium chloride flush  3 mL Intravenous Q12H   umeclidinium bromide  1 puff Inhalation Daily    Infusions:   PRN Medications: acetaminophen **OR** acetaminophen, albuterol, bisacodyl, guaiFENesin, hydrALAZINE, ondansetron **OR** ondansetron (ZOFRAN) IV, oxyCODONE, polyethylene glycol, polyvinyl  alcohol, zolpidem   Assessment/Plan   1. Chronic diastolic CHF: Echo in 59/56 with EF 55-60%, mild LVH, mildly dysfunctional RV.  Echo in 11/23 with EF 50-55%, RV normal, IVC dilated. Volume stabe. Continue lasix 60 mg twice a day .  - Add 12.5 mg spiro daily. Give 20 meq K today  - Continue Jardiance 10 mg daily. No GU symptoms. 2. Hyperlipidemia: 1/23 lipids with good LDL. - Continue Crestor 40 mg daily.  - Continue Vascepa 2 g bid. 3. Carotid stenosis: s/p R CEA.  Dopplers followed at [facility].  4. PAD: s/p patch angioplasty to left fem-pop bypass in 11/16.  Followed at [facility].  Peripheral arterial dopplers in 3/23 with >50% right iliac stenosis, but no rest pain or pedal ulcerations.  Rare mild claudication.  - Follows with [facility].  5. AAA: Stable on last [name], followed at [facility].   6. AECOPD: No longer smoking. PFTs in 1/18 showed moderately severe obstructive defect. CT chest in 11/18 showed emphysema.  His oxygen saturation was low at admission, suspect due to progression of COPD vs acute exacerbation.  He is being treated for COPD exacerbation with azithromycin, nebs, and prednisone.  - Viral paned negative.   - Continue prednisone, nebs, azithromycin per primary service.  - May need home oxygen .  Currently on 6 liters.  7. Atrial fibrillation: S/p atrial fibrillation ablation in 7/20 and redo in 5/21.  He has been primarily in NSR since redo ablation.  Back in slow atrial fibrillation 9/23 and s/p DCCV 10/23 to NSR.  -  He saw EP recently, poor candidate for redo ablation.  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 GFR would limit Tikosyn to the lowest dose, probably not a good choice for him.  - In SR with PACs - Continue Toprol XL 25 mg daily.  - Continue Eliquis 2.5 mg bid. (Reduced dose with creatinine and age). - Continue to use CPAP.  8. CKD: Stage 3.  Creatinine 1.54 today is at his baseline.  9. CAD: LHC in 11/18 with nonobstructive  disease only.  Cardiolite in 6/21 with no ischemia/infarction. No chest pain. HS-TnI mildly elevated at admission with no trend, ?demand ischemia due to hypoxemia in setting of COPD exacerbation.  10. 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  He is no longer on methimazole, follows with endocrinology.     11. Colon cancer: Sigmoid adenocarcinoma, s/p surgical resection. In remission.  12. HTN: Continue amlodipine, BP improved.  Add 12.5 mg spironolactone daily.  13. Pleural effusion: Chronic right pleural effusion, s/p right lower lobectomy.  14. Squamous cell lung cancer: S/p right lower  lobectomy.  Follows with Dr. Valeta Harms.  15. Upper GI bleeding: In 5/23 due to duodenal ulcer. No abnormal bleeding. - hgb stable.  16. Neurology: Persistent HA, CT normal.   Dr Aundra Dubin discussed with patient's daughter.  I do not think he can live on his own.  He will need home oxygen.  He will need rehab stay at discharge then think he will need long-term SNF.   Length of Stay: 2  Darrick Grinder, NP  08/27/2022, 7:47 AM  Advanced Heart Failure Team Pager [phone] (M-F; 7a - 5p)  Please contact [facility] for night-coverage after hours (5p -7a ) and weekends on [email or web address]   Patient seen with NP, agree with the above note.   Lasix and Jardiance restarted yesterday.  I/Os negative -2530.  Currently on 2L [place], oxygen saturation 95%.    COVID and respiratory virus panels negative.    General: NAD Neck: JVP 8-9 cm, no thyromegaly or thyroid nodule.  Lungs: Rhonchi bilaterally CV: Nondisplaced PMI.  Heart regular S1/S2, no S3/S4, 1/6 SEM RUSB.  No peripheral edema.   Abdomen: Soft, nontender, no hepatosplenomegaly, no distention.  Skin: Intact without lesions or rashes.  Neurologic: Alert and oriented x 3.  Psych: Normal affect. Extremities: No clubbing or cyanosis.  HEENT: Normal.   Probably mild volume overload, good diuresis yesterday after resuming home Lasix and Jardiance.  Today,  will start spironolactone 12.5 mg daily.    BP still elevated at times, as above adding on spironolactone.  Continue amlodipine.   Primary team treating for AECOPD.   He will need acute rehab, then think long-term he will need assisted living.  [name] 08/27/2022 11:33 AM

## 2022-08-27 NOTE — Progress Notes (Signed)
Initial Nutrition Assessment  DOCUMENTATION CODES:   Not applicable  INTERVENTION:  Encourage adequate PO intake Ordered automatic meal delivery  Double protein portions with meals 10A and 2P snack to optimize PO intake MVI with minerals daily Recommend bowel regimen  NUTRITION DIAGNOSIS:   Increased nutrient needs related to chronic illness (CHF, COPD, CKD) as evidenced by estimated needs.  GOAL:   Patient will meet greater than or equal to 90% of their needs  MONITOR:   PO intake, Labs, Weight trends, I & O's  REASON FOR ASSESSMENT:   Consult Other (Comment) (nutrition goals)  ASSESSMENT:   Pt admitted with SOB r/t acute exacerbation COPD. PMH significant for afib, chronic diastolic CHF, CKD stage 3, colon and lung cancer, COPD, HTN, HLD, PVD s/p stent.  Pt resting at time of visit. Awoke to his name. He states that his appetite and PO intake is about at his baseline. He reports eating most of each meal provided during admission, however since he has been ordering his meals, he has been eating breakfast around lunch time. He states that he sometimes wishes there was more food on each tray. Will order double protein and snacks BID between meals. No documented meal completions on file to review.   At home he recalls eating out for his meals, twice daily. He typically eats hamburgers, fried chicken and vegetables. He drinks about 5-6 8oz beverages including fruit juice, crangrape juice, and water.   Pt reports that his last known weight was 184.1 lbs and denies changes to his weight. Reviewed weight history. Within the last year, his weight appears to have fluctuated up and down between 87-90 kg. More recently, it appears that his weight has been trending down. -6.1 kg between 11/14-11/24. Question whether this loss is r/t his acute CHF as pt has been on lasix during admission. Will continue to monitor throughout admission.   Edema: non-pitting BLE  Pt likely has a degree of  malnutrition given arm and lower extremity fat and muscle losses, however based on pt's report of PO intake at his baseline, unable to diagnose at this time. Will continue to reassess as able throughout admission.   Medications: jardiance, 60mg  lasix BID, SSI 0-5 units qhs, SSI 0-9 units TID, melatonin, protonix, klor-con, prednisone  Labs: potassium 3.4, BUN 36, Cr 1.40, GFR 50, HgbA1c 6.8%, CBG's 122, 180  UOP: 2624ml x24 hours I/O's: -4334ml since admit  NUTRITION - FOCUSED PHYSICAL EXAM:  Flowsheet Row Most Recent Value  Orbital Region No depletion  Upper Arm Region Severe depletion  Thoracic and Lumbar Region Mild depletion  Buccal Region No depletion  Temple Region No depletion  Clavicle Bone Region No depletion  Clavicle and Acromion Bone Region No depletion  Scapular Bone Region No depletion  Dorsal Hand Mild depletion  Patellar Region Moderate depletion  Anterior Thigh Region Moderate depletion  Posterior Calf Region Mild depletion  Edema (RD Assessment) None  Hair Reviewed  Eyes Reviewed  Mouth Reviewed  Skin Reviewed  Nails Reviewed       Diet Order:   Diet Order             Diet Heart Room service appropriate? Yes; Fluid consistency: Thin  Diet effective now                  EDUCATION NEEDS:   No education needs have been identified at this time  Skin:  Skin Assessment: Reviewed RN Assessment  Last BM:  11/23  Height:   Ht  Readings from Last 1 Encounters:  08/24/22 6\' 1"  (1.854 m)    Weight:   Wt Readings from Last 1 Encounters:  08/24/22 80.5 kg   BMI:  Body mass index is 23.41 kg/m.  Estimated Nutritional Needs:   Kcal:  2000-2200  Protein:  100-115g  Fluid:  >/=2L  Clayborne Dana, RDN, LDN Clinical Nutrition

## 2022-08-27 NOTE — Progress Notes (Signed)
[facility] PROGRESS NOTE  Troy Anaya [name] (DOB: 05/04/1941) ZOX:[id number] PCP: Binnie Rail, MD  Brief Narrative: Troy Kindred [name] is an 81 y.o. male with a history of Afib on eliquis; chronic HFpEF; stage 3 CKD; colon and lung cancer; COPD; glaucoma; HTN; HLD; PVD s/p stent; and OSA on CPAP who presented 11/24 with shortness of breath and peripheral swelling found to be hypoxemic. Treatment for acute hypoxic respiratory failure and AECOPD initiated on admission.   Subjective: Having more chest congestion, sounds "phlegmy" per daughter, cough is worse today and started actually after admission. He reports shortness of breath when asked and confirms it is worse when laying more supine and improved with sitting up. Has no subjective fever or chills. Temp up to 99.   Objective: BP (!) 151/65 (BP Location: Left Arm)   Pulse 72   Temp 99 F (37.2 C) (Oral)   Resp 16   Ht 6\' 1"  (1.854 m)   Wt 80.5 kg   SpO2 96%   BMI 23.41 kg/m   Gen: No distress Pulm: Inspiratory and expiratory low pitched rhonchi, normal rate and effort.  CV: RRR, no rub or gallop. No significant edema GI: Soft, NT, ND, +BS  Neuro: Alert and oriented. No new focal deficits. Ext: Warm, no deformities Skin: No new rashes, lesions or ulcers on visualized skin   Assessment & Plan: Acute hypoxic respiratory failure due to AECOPD: CXR with chronic right pleural effusion/scarring, no acute cardiopulm process initially. Covid, flu, full viral panel negative.  - Continue supplemental oxygen for SpO2 < 90% and/or elevated WOB. May need this newly at discharge though it's likely that this was an undiagnosed need prior to acute illness.  - Continue steroids thru 11/28 - Continue azithromycin (11/24 - 11/28) - Repeat CXR as pt's daughter reports increasing productive cough.  - Neb ordered given abnormal pulmonary exam. - Continue breztri, singulair, scheduled duonebs, prn albuterol - Coordinated care with  TOC team/PT/OT/Nutrition/RT consults   AKI on stage 3a CKD: Improved. Cr 1.95 >> 1.40 - Resumed lasix and jardiance with CrCl > 64ml/min. Holding ARB.    Chronic HFpEF, HTN: Echo this admit not significantly changed from prior. EF 50-55%, LV endocardial border not optimally defined to evaluate regional wall motion, IVC dilated with <50% resp variability  - Cardiology consulted, [name] following, appreciate insights. Restarted lasix, jardiance 11/26, continue.  - Continue norvasc  PAF: s/p ablation July 2020, May 2021, had recurrence and said to be poor candidate for redo ablation per EP. s/p DCCV Oct 2023   - Continue metoprolol  - Continue dose-reduced eliquis (age > 105, Cr > 1.5 though is very close to this cutoff).  - Avoid amiodarone due to concern for pulmonary toxicity.  - Tikosyn not ideal with renal dysfunction.   Deconditioning: Severe, no longer safe to discharge home at this time.  - SNF rehabilitation being pursued, insurance authorization pending. Should be stable for discharge in next 24 hours.  CAD, demand myocardial ischemia: Mildly elevated Tn at admission in setting of hypoxemia. No anginal chest pain. Nonobstructive disease at last cath.  - Continue metoprolol, anticoagulation, rosuvastatin 40mg . Last LDL is 34. - Defer further work up at this time per cardiology.  History of squamous cell lung cancer s/p right lower lobectomy with chronic right pleural effusion:  - Continue outpatient follow up with Dr. Valeta Harms (also following a non-avid by PET RUL nodule).   Headache: Had head CT for this on presentation (without acute findings).  No current complaints.  - Continue monitoring    HTN -Holding losartan -Continue metprolol, amlodipine   HLD - Continue crestor   T2DM: HbA1c 6.8% indicating good control.  - Restarted jardiance.  - CBGs at inpatient goal. If remain < 180, can stop CBG checks.    Glaucoma: With advanced visual impairment - Continue latanoprost,  simbrinza, ophthalmology follow up.   AAA: PET scan showed 4.2cm July 2023. By U/S it was 3.7cm March 2023, was 3.3cm Jan 2019.  - The importance of follow up with vascular surgery was stressed to the patient and daughter.   PAD: s/p patch angioplasty to left fem-pop bypass in 11/16. Peripheral arterial dopplers in 3/23 with >50% right iliac stenosis, but no rest pain or pedal ulcerations.  - [facility]  Carotid stenosis: s/p right CEA.  - Follow up with [facility]  Mood disorder: Quiescent - Continue lexapro, melatonin   OSA - Continue CPAP   DNR: POA per admitting MD.    History of hyperthyroidism: Thought to be due to amiodarone which was stopped. Pt no longer on methimazole, last TSH 1.69 earlier this year.   History of sigmoid adenocarcinoma: s/p resection.   History of duodenal ulcer bleeding: No bleeding noted currently.   [name], MD [facility] [email or web address] 08/27/2022, 4:01 PM

## 2022-08-27 NOTE — Progress Notes (Signed)
Physical Therapy Treatment Patient Details Name: Troy Elvin [name] MRN: [id number] DOB: 03-26-1941 Today's Date: 08/27/2022   History of Present Illness Pt is an 81 y/o male presenting on 11/24 with SOB. Admitted with acute respiratory failure with associated COPD exacerbation, AKI, and chronic diastolic CHF.  PMHx significant for CHF, A-fib, COPD, PAF, CKD IIIa, colon and lung cancer, glaucoma, PVD s/p stent, OSA on CPAP, and HLD.    PT Comments    Pt progressing towards his physical therapy goals and is agreeable to participate. Pt ambulating 75 ft with RW and min assist. Emphasis on multimodal cueing for larger step lengths and increased foot clearance in addition to performing standing marching to promote. Pt presents as a high fall risk based on decreased gait speed, safety awareness and history of falls. Continue to recommend SNF for ongoing Physical Therapy.      Recommendations for follow up therapy are one component of a multi-disciplinary discharge planning process, led by the attending physician.  Recommendations may be updated based on patient status, additional functional criteria and insurance authorization.  Follow Up Recommendations  Skilled nursing-short term rehab (<3 hours/day) Can patient physically be transported by private vehicle: Yes   Assistance Recommended at Discharge Frequent or constant Supervision/Assistance  Patient can return home with the following Assistance with cooking/housework;Direct supervision/assist for medications management;Direct supervision/assist for financial management;Assist for transportation;Help with stairs or ramp for entrance;A little help with walking and/or transfers;A little help with bathing/dressing/bathroom   Equipment Recommendations  None recommended by PT    Recommendations for Other Services       Precautions / Restrictions Precautions Precautions: Fall;Other (comment) Precaution Comments: watch  O2 Restrictions Weight Bearing Restrictions: No     Mobility  Bed Mobility Overal bed mobility: Needs Assistance Bed Mobility: Supine to Sit, Sit to Supine     Supine to sit: Min assist Sit to supine: Min guard   General bed mobility comments: MinA to assist trunk with ascension    Transfers Overall transfer level: Needs assistance Equipment used: Rolling walker (2 wheels) Transfers: Sit to/from Stand Sit to Stand: Min assist           General transfer comment: MinA to boost and initially steady. Posterior lean    Ambulation/Gait Ambulation/Gait assistance: Min assist Gait Distance (Feet): 75 Feet Assistive device: Rolling walker (2 wheels) Gait Pattern/deviations: Step-through pattern, Decreased stride length, Shuffle, Trunk flexed, Narrow base of support Gait velocity: decreased     General Gait Details: Max multimodal cues for larger step lengths, increased clearance, walker proximity, environmental navigation. Pt with shuffling gait pattern   Stairs             Wheelchair Mobility    Modified Rankin (Stroke Patients Only)       Balance Overall balance assessment: Needs assistance Sitting-balance support: No upper extremity supported, Feet supported Sitting balance-Leahy Scale: Fair     Standing balance support: Bilateral upper extremity supported, During functional activity Standing balance-Leahy Scale: Poor Standing balance comment: posterior lean and relies on BUE and external support                            Cognition Arousal/Alertness: Awake/alert Behavior During Therapy: WFL for tasks assessed/performed Overall Cognitive Status: Impaired/Different from baseline Area of Impairment: Memory, Problem solving, Awareness, Safety/judgement, Following commands                     Memory: Decreased short-term memory  Following Commands: Follows one step commands consistently, Follows one step commands with increased time,  Follows multi-step commands inconsistently Safety/Judgement: Decreased awareness of safety Awareness: Emergent Problem Solving: Slow processing, Difficulty sequencing, Requires verbal cues          Exercises General Exercises - Lower Extremity Long Arc Quad: Both, 15 reps, Seated Hip Flexion/Marching: Both, 20 reps, Seated, Standing Heel Raises: Both, 20 reps, Seated    General Comments        Pertinent Vitals/Pain Pain Assessment Pain Assessment: No/denies pain    Home Living                          Prior Function            PT Goals (current goals can now be found in the care plan section) Acute Rehab PT Goals Patient Stated Goal: return home, remain independent Potential to Achieve Goals: Good Progress towards PT goals: Progressing toward goals    Frequency    Min 3X/week      PT Plan Current plan remains appropriate    Co-evaluation              AM-PAC PT "6 Clicks" Mobility   Outcome Measure  Help needed turning from your back to your side while in a flat bed without using bedrails?: A Little Help needed moving from lying on your back to sitting on the side of a flat bed without using bedrails?: A Little Help needed moving to and from a bed to a chair (including a wheelchair)?: A Little Help needed standing up from a chair using your arms (e.g., wheelchair or bedside chair)?: A Little Help needed to walk in hospital room?: A Little Help needed climbing 3-5 steps with a railing? : A Lot 6 Click Score: 17    End of Session Equipment Utilized During Treatment: Gait belt;Oxygen Activity Tolerance: Patient tolerated treatment well Patient left: in bed;with call bell/phone within reach;with bed alarm set Nurse Communication: Mobility status PT Visit Diagnosis: Unsteadiness on feet (R26.81);Other abnormalities of gait and mobility (R26.89);Repeated falls (R29.6);Muscle weakness (generalized) (M62.81)     Time: 1610-9604 PT Time  Calculation (min) (ACUTE ONLY): 24 min  Charges:  $Gait Training: 8-22 mins $Therapeutic Activity: 8-22 mins                     Wyona Almas, PT, DPT Acute Rehabilitation Services Office [place] 08/27/2022, 5:07 PM

## 2022-08-27 NOTE — TOC Progression Note (Addendum)
Transition of Care (TOC) - Progression Note    Patient Details  Name: Troy Lasser [name] MRN: [id number] Date of Birth: 09/20/1941  Transition of Care [place]) CM/SW [place], [place] Phone Number: 08/27/2022, 3:10 PM  Clinical Narrative:     Update- CSW received message from [place] with [facility] who informed CSW they can offer SNF bed for patient $43.00 a day. CSW called patients daughter [name] who accepted SNF bed offers. CSW called patients insurance authorization and started insurance authorization for SNF and PTAR. CSW awaiting determination.  CSW provided patient with patients daughter SNF bed offers. Patient and patients daughter [name] accepted SNF bed offer with [facility]. CSW spoke with Nicki with [facility] confirmed SNF bed offer for patient.  CSW called patients insurance and LVM. CSW awaiting callback to start insurance authorization for patient. CSW will continue to follow and assist with patients dc planning needs.  Expected Discharge Plan: [place] Barriers to Discharge: Continued Medical Work up, Ship broker  Expected Discharge Plan and Services Expected Discharge Plan: [place] arrangements for the past 2 months: [place] Determinants of Health (SDOH) Interventions    Readmission Risk Interventions    07/04/2021    3:08 PM 07/04/2021    1:14 PM  Readmission Risk Prevention Plan  Transportation Screening Complete Complete  Medication Review (RN Care Manager) Referral to Pharmacy Referral to Pharmacy  PCP or Specialist appointment within 3-5 days of discharge Complete Complete  HRI or [place] Complete Complete  SW Recovery Care/Counseling Consult Complete   Palliative Care Screening Not [place] Patient Refused

## 2022-08-28 DIAGNOSIS — N1831 Chronic kidney disease, stage 3a: Secondary | ICD-10-CM | POA: Diagnosis not present

## 2022-08-28 DIAGNOSIS — I5032 Chronic diastolic (congestive) heart failure: Secondary | ICD-10-CM | POA: Diagnosis not present

## 2022-08-28 DIAGNOSIS — I10 Essential (primary) hypertension: Secondary | ICD-10-CM | POA: Diagnosis not present

## 2022-08-28 DIAGNOSIS — N179 Acute kidney failure, unspecified: Secondary | ICD-10-CM | POA: Diagnosis not present

## 2022-08-28 LAB — BASIC METABOLIC PANEL
Anion gap: 11 (ref 5–15)
BUN: 36 mg/dL — ABNORMAL HIGH (ref 8–23)
CO2: 23 mmol/L (ref 22–32)
Calcium: 8.6 mg/dL — ABNORMAL LOW (ref 8.9–10.3)
Chloride: 102 mmol/L (ref 98–111)
Creatinine, Ser: 1.44 mg/dL — ABNORMAL HIGH (ref 0.61–1.24)
GFR, Estimated: 49 mL/min — ABNORMAL LOW (ref >60)
Glucose, Bld: 128 mg/dL — ABNORMAL HIGH (ref 70–99)
Potassium: 3.8 mmol/L (ref 3.5–5.1)
Sodium: 136 mmol/L (ref 135–145)

## 2022-08-28 LAB — GLUCOSE, CAPILLARY
Glucose-Capillary: 139 mg/dL — ABNORMAL HIGH (ref 70–99)
Glucose-Capillary: 169 mg/dL — ABNORMAL HIGH (ref 70–99)
Glucose-Capillary: 222 mg/dL — ABNORMAL HIGH (ref 70–99)
Glucose-Capillary: 182 mg/dL — ABNORMAL HIGH (ref 70–99)

## 2022-08-28 LAB — PROCALCITONIN: Procalcitonin: 0.58 ng/mL

## 2022-08-28 MED ORDER — LEVOFLOXACIN 500 MG PO TABS
500.0000 mg | ORAL_TABLET | Freq: Every day | ORAL | Status: DC
  Administered 2022-08-28: 500 mg via ORAL
  Filled 2022-08-28: qty 1

## 2022-08-28 MED ORDER — FUROSEMIDE 40 MG PO TABS
80.0000 mg | ORAL_TABLET | Freq: Two times a day (BID) | ORAL | Status: DC
  Administered 2022-08-29: 80 mg via ORAL
  Filled 2022-08-28: qty 2

## 2022-08-28 MED ORDER — LEVOFLOXACIN 500 MG PO TABS
500.0000 mg | ORAL_TABLET | Freq: Every day | ORAL | Status: DC
  Administered 2022-08-29: 500 mg via ORAL
  Filled 2022-08-28: qty 1

## 2022-08-28 MED ORDER — SPIRONOLACTONE 25 MG PO TABS
25.0000 mg | ORAL_TABLET | Freq: Every day | ORAL | Status: DC
  Administered 2022-08-28 – 2022-08-29 (×2): 25 mg via ORAL
  Filled 2022-08-28 (×2): qty 1

## 2022-08-28 MED ORDER — POTASSIUM CHLORIDE CRYS ER 20 MEQ PO TBCR
40.0000 meq | EXTENDED_RELEASE_TABLET | Freq: Once | ORAL | Status: AC
  Administered 2022-08-28: 40 meq via ORAL
  Filled 2022-08-28: qty 2

## 2022-08-28 MED ORDER — FUROSEMIDE 10 MG/ML IJ SOLN
80.0000 mg | Freq: Once | INTRAMUSCULAR | Status: AC
  Administered 2022-08-28: 80 mg via INTRAVENOUS
  Filled 2022-08-28: qty 8

## 2022-08-28 NOTE — Progress Notes (Signed)
[facility] PROGRESS NOTE  Troy Gustafson [name] (DOB: 12-28-1940) XHB:[id number] PCP: Binnie Rail, MD  Brief Narrative: Troy Kindred [name] is an 81 y.o. male with a history of Afib on eliquis; chronic HFpEF; stage 3 CKD; colon and lung cancer; COPD; glaucoma; HTN; HLD; PVD s/p stent; and OSA on CPAP who presented 11/24 with shortness of breath and peripheral swelling found to be hypoxemic. Treatment for acute hypoxic respiratory failure and AECOPD initiated on admission. Heart failure team has followed the patient as there was concern for exacerbation at admission. Due to some volume overload suspected, IV lasix given and oral diuretic will be increased.   Due to increasing cough and persistent hypoxemia, CXR repeated 11/27 shows increased RLL opacity, PCT elevated to 0.58. Levaquin started for pneumonia after discussion with patient and family.    Subjective: Patient seen this morning and again this afternoon hoping to meet with family at bedside, missed them both times. Patient himself has no complaints, feels his breathing is much better this afternoon. Children state he's been having worsening confusion and even hallucination over the past few days/nights.   Objective: BP (!) 162/75 (BP Location: Left Arm)   Pulse 63   Temp 98.2 F (36.8 C) (Oral)   Resp 16   Ht 6\' 1"  (1.854 m)   Wt 80.9 kg   SpO2 94%   BMI 23.53 kg/m   Gen: No distress, resting quietly Pulm: Diminished most at left base, low pitched rhonchi stable/slightly improved. Nonlabored.  CV: RRR, trace pitting edema, +mild JVD GI: Soft, NT, ND, +BS Neuro: Alert and interactive, conversant, follows commands. No new focal deficits. Ext: Warm, no deformities Skin: No rashes, lesions or ulcers on visualized skin   Assessment & Plan: Acute hypoxic respiratory failure due to AECOPD, suspected RLL pneumonia: CXR with chronic right pleural effusion/scarring, no acute cardiopulm process initially, repeat CXR due to  worsening cough shows increase in RLL opacity. Covid, flu, full viral panel negative.  - Continue supplemental oxygen for SpO2 < 90% and/or elevated WOB. Will check pulse oximetry with ambulation in AM.  - Completed steroids and azithromycin (11/24 - 11/28) - With tenuous respiratory status, elevated PCT, increased RLL opacity, antibiotics started 11/28. Due to high severity PCN allergy, levaquin ordered after informed consent discussion of side effects/black box warning with daughter and patient. Plan 5 days. - Neb ordered given abnormal pulmonary exam. - Continue breztri, singulair, scheduled duonebs, prn albuterol - Coordinated care with TOC team/PT/OT/Nutrition/RT consults   AKI on stage 3a CKD: Improved. Cr 1.95 >> 1.40 - Resumed lasix and jardiance with CrCl > 48ml/min. Holding ARB.    Acute on chronic HFpEF, HTN: Echo this admit not significantly changed from prior. EF 50-55%, LV endocardial border not optimally defined to evaluate regional wall motion, IVC dilated with <50% resp variability  - Cardiology consulted, [name] following, appreciate insights. Restarted lasix, jardiance 11/26, felt to be volume up, recommending augmenting lasix to 80mg  po BID, also ordered IV dose at 6pm. Will monitor volume status, BMP. K supp ordered concomitantly. - Increased spironolactone to 25 mg.  - Continue norvasc  PAF: s/p ablation July 2020, May 2021, had recurrence and said to be poor candidate for redo ablation per EP. s/p DCCV Oct 2023   - Continue metoprolol  - Continue dose-reduced eliquis (age > 52, Cr > 1.5 though is very close to this cutoff).  - Avoid amiodarone due to concern for pulmonary toxicity.  - Tikosyn not ideal with renal  dysfunction.   Acute delirium, suspect progressive cognitive impairment: Unclear what baseline will be after acute episode. No focal deficits.  - Continue delirium precautions.   Deconditioning: Severe, no longer safe to discharge home at this time.  -  SNF rehabilitation being pursued, insurance authorization pending. Should be stable for discharge in next 24 hours.  CAD, demand myocardial ischemia: Mildly elevated Tn at admission in setting of hypoxemia. No anginal chest pain. Nonobstructive disease at last cath.  - Continue metoprolol, anticoagulation, rosuvastatin 40mg . Last LDL is 34. - Defer further work up at this time per cardiology.  History of squamous cell lung cancer s/p right lower lobectomy with chronic right pleural effusion:  - Continue outpatient follow up with Dr. Valeta Harms (also following a non-avid by PET RUL nodule).   Headache: Had head CT for this on presentation (without acute findings). No current complaints.  - Continue monitoring    HTN -Holding losartan -Continue metprolol, amlodipine   HLD - Continue crestor   T2DM: HbA1c 6.8% indicating good control.  - Restarted jardiance.  - CBGs at inpatient goal. If remain < 180, can stop CBG checks.    Glaucoma: With advanced visual impairment - Continue latanoprost, simbrinza, ophthalmology follow up.   AAA: PET scan showed 4.2cm July 2023. By U/S it was 3.7cm March 2023, was 3.3cm Jan 2019.  - The importance of follow up with vascular surgery was stressed to the patient and daughter.   PAD: s/p patch angioplasty to left fem-pop bypass in 11/16. Peripheral arterial dopplers in 3/23 with >50% right iliac stenosis, but no rest pain or pedal ulcerations.  - Follow [facility] with [facility]  Carotid stenosis: s/p right CEA.  - Follow up with [facility]  Mood disorder: Quiescent - Continue lexapro, melatonin   OSA - Continue CPAP   DNR: POA per admitting MD.    History of hyperthyroidism: Thought to be due to amiodarone which was stopped. Pt no longer on methimazole, last TSH 1.69 earlier this year.   History of sigmoid adenocarcinoma: s/p resection.   History of duodenal ulcer bleeding: No bleeding noted currently.   Troy Pour, MD [facility] [email or web address] 08/28/2022, 1:40 PM

## 2022-08-28 NOTE — Progress Notes (Signed)
Mobility Specialist - Progress Note   08/28/22 1558  Mobility  Activity Transferred from chair to bed  Level of Assistance Moderate assist, patient does 50-74%  Assistive Device Front wheel walker  Activity Response Tolerated well  Mobility Referral Yes  $Mobility charge 1 Mobility   Pt received in chair requesting assistance back to bed. Pt wad ModA to stand and MinA to ambulate to bed. Pt was left in bed with all needs met and bed alarm on.   Franki Monte  Mobility Specialist Please contact via Solicitor or Rehab office at [phone]

## 2022-08-28 NOTE — Progress Notes (Addendum)
Patient ID: Troy Ratz [name], male   DOB: January 08, 1941, 81 y.o.   MRN: [id number]     Advanced Heart Failure Rounding Note  PCP-Cardiologist: [name], MD   Subjective:   Lasix and Jardiance restarted 11/26  Down to 1L [place], hates the CPAP. Feels good this morning, denies CP and SOB.   Objective:   Weight Range: 80.9 kg Body mass index is 23.53 kg/m.   Vital Signs:   Temp:  [97.9 F (36.6 C)-99 F (37.2 C)] 98.4 F (36.9 C) (11/28 0503) Pulse Rate:  [61-72] 61 (11/28 0503) Resp:  [16-18] 16 (11/28 0503) BP: (151-170)/(65-77) 157/67 (11/28 0503) SpO2:  [94 %-96 %] 94 % (11/28 0503) Weight:  [80.9 kg] 80.9 kg (11/28 0500) Last BM Date : 08/23/22  Weight change: Filed Weights   08/24/22 0149 08/24/22 1822 08/28/22 0500  Weight: 85.3 kg 80.5 kg 80.9 kg    Intake/Output:   Intake/Output Summary (Last 24 hours) at 08/28/2022 0828 Last data filed at 08/28/2022 0504 Gross per 24 hour  Intake --  Output 2150 ml  Net -2150 ml      Physical Exam    General:  elderly appearing.   HEENT: vision impairment Neck: supple. JVD ~9 cm. Carotids 2+ bilat; no bruits. No lymphadenopathy or thyromegaly appreciated. Cor: PMI nondisplaced. Regular rate & rhythm. No rubs, gallops or murmurs. Lungs: EW Abdomen: soft, nontender, nondistended. No hepatosplenomegaly. No bruits or masses. Good bowel sounds. Extremities: no cyanosis, clubbing, rash, edema  Neuro: alert & oriented x 3, cranial nerves grossly intact. moves all 4 extremities w/o difficulty. Affect pleasant.   Telemetry   SR w/ PACs 70s (Personally reviewed)    Labs    CBC Recent Labs    08/26/22 0215  WBC 6.3  NEUTROABS 5.7  HGB 12.8*  HCT 37.9*  MCV 90.2  PLT 774   Basic Metabolic Panel Recent Labs    08/26/22 0215 08/27/22 0624 08/28/22 0301  NA 134* 135 136  K 4.0 3.4* 3.8  CL 100 98 102  CO2 22 23 23   GLUCOSE 172* 132* 128*  BUN 41* 36* 36*  CREATININE 1.54* 1.40* 1.44*  CALCIUM 8.4*  8.6* 8.6*  MG 2.5*  --   --   PHOS 3.5  --   --    Liver Function Tests Recent Labs    08/26/22 0215  AST 23  ALT 18  ALKPHOS 54  BILITOT 0.7  PROT 6.2*  ALBUMIN 3.1*   No results for input(s): "LIPASE", "AMYLASE" in the last 72 hours. Cardiac Enzymes No results for input(s): "CKTOTAL", "CKMB", "CKMBINDEX", "TROPONINI" in the last 72 hours.  BNP: BNP (last 3 results) Recent Labs    07/31/22 1444 08/14/22 1619 08/24/22 0137  BNP 227.9* 186.5* 256.7*    ProBNP (last 3 results) No results for input(s): "PROBNP" in the last 8760 hours.   D-Dimer No results for input(s): "DDIMER" in the last 72 hours. Hemoglobin A1C No results for input(s): "HGBA1C" in the last 72 hours. Fasting Lipid Panel No results for input(s): "CHOL", "HDL", "LDLCALC", "TRIG", "CHOLHDL", "LDLDIRECT" in the last 72 hours. Thyroid Function Tests No results for input(s): "TSH", "T4TOTAL", "T3FREE", "THYROIDAB" in the last 72 hours.  Invalid input(s): "FREET3"  Other results:   Imaging    DG CHEST PORT 1 VIEW  Result Date: 08/27/2022 CLINICAL DATA:  Acute respiratory failure with hypoxia EXAM: PORTABLE CHEST 1 VIEW COMPARISON:  Radiograph 08/24/2022 FINDINGS: Unchanged cardiomediastinal silhouette. There is a chronic small right pleural  effusion. There is adjacent right lower lung airspace disease, slightly increased from prior exams. Left lung is clear. No pneumothorax. Bones are unchanged. IMPRESSION: Chronic small right pleural effusion with slightly increased adjacent right lower lung opacities which could be atelectasis or infection. Electronically Signed   By: Maurine Simmering M.D.   On: 08/27/2022 16:38     Medications:     Scheduled Medications:  albuterol  2.5 mg Nebulization Once   amLODipine  10 mg Oral Daily   apixaban  2.5 mg Oral BID   azithromycin  500 mg Oral Daily   brinzolamide  1 drop Both Eyes TID   And   brimonidine  1 drop Both Eyes TID   empagliflozin  10 mg Oral Daily    escitalopram  10 mg Oral Daily   fluticasone furoate-vilanterol  1 puff Inhalation Daily   furosemide  60 mg Oral BID   insulin aspart  0-5 Units Subcutaneous QHS   insulin aspart  0-9 Units Subcutaneous TID WC   latanoprost  1 drop Both Eyes QHS   levofloxacin  500 mg Oral Daily   melatonin  20 mg Oral QHS   metoprolol succinate  25 mg Oral Daily   montelukast  10 mg Oral QHS   pantoprazole  40 mg Oral Daily   predniSONE  40 mg Oral Q breakfast   rosuvastatin  40 mg Oral Daily   sodium chloride flush  3 mL Intravenous Q12H   spironolactone  12.5 mg Oral Daily   umeclidinium bromide  1 puff Inhalation Daily    Infusions:   PRN Medications: acetaminophen **OR** acetaminophen, albuterol, bisacodyl, guaiFENesin, hydrALAZINE, ondansetron **OR** ondansetron (ZOFRAN) IV, oxyCODONE, polyethylene glycol, polyvinyl alcohol, zolpidem   Assessment/Plan   1. Chronic diastolic CHF: Echo in 29/52 with EF 55-60%, mild LVH, mildly dysfunctional RV.  Echo in 11/23 with EF 50-55%, RV normal, IVC dilated. Volume stabe. Continue lasix 60 mg twice a day .  - Increase 12.5>25 mg spiro daily. - Continue Jardiance 10 mg daily. No GU symptoms. 2. Hyperlipidemia: 1/23 lipids with good LDL. - Continue Crestor 40 mg daily.  - Continue Vascepa 2 g bid. 3. Carotid stenosis: s/p R CEA.  Dopplers followed at [facility].  4. PAD: s/p patch angioplasty to left fem-pop bypass in 11/16.  Followed at [facility].  Peripheral arterial dopplers in 3/23 with >50% right iliac stenosis, but no rest pain or pedal ulcerations.  Rare mild claudication.  - Follows with [facility].  5. AAA: Stable on last [name], followed at [facility].   6. AECOPD: No longer smoking. PFTs in 1/18 showed moderately severe obstructive defect. CT chest in 11/18 showed emphysema.  His oxygen saturation was low at admission, suspect due to progression of COPD vs acute exacerbation.  He is being treated for COPD exacerbation with azithromycin, nebs, and prednisone.  - Viral  paned negative.   - Continue prednisone, nebs, azithromycin per primary service.  - May need home oxygen. Currently down to 1 liter, sats stable.  7. Atrial fibrillation: S/p atrial fibrillation ablation in 7/20 and redo in 5/21.  He has been primarily in NSR since redo ablation.  Back in slow atrial fibrillation 9/23 and s/p DCCV 10/23 to NSR.  -  He saw EP recently, poor candidate for redo ablation.  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 GFR would limit Tikosyn to the lowest dose, probably not a good choice for him.  - In SR with PACs - Continue Toprol  XL 25 mg daily.  - Continue Eliquis 2.5 mg bid. (Reduced dose with creatinine and age). - Continue to use CPAP.  8. CKD: Stage 3. Creatinine 1.44 today is at his baseline.  9. CAD: LHC in 11/18 with nonobstructive disease only.  Cardiolite in 6/21 with no ischemia/infarction. No chest pain. HS-TnI mildly elevated at admission with no trend, ?demand ischemia due to hypoxemia in setting of COPD exacerbation.  10. 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  He is no longer on methimazole, follows with endocrinology.     11. Colon cancer: Sigmoid adenocarcinoma, s/p surgical resection. In remission.  12. HTN: Continue amlodipine and increase spironolactone daily. Consider hydralazine if elevated BP persists?  13. Pleural effusion: Chronic right pleural effusion, s/p right lower lobectomy.  14. Squamous cell lung cancer: S/p right lower lobectomy.  Follows with Dr. Valeta Harms.  15. Upper GI bleeding: In 5/23 due to duodenal ulcer. No abnormal bleeding. - hgb stable.  16. Neurology: Persistent HA, CT normal.   Dr Aundra Dubin discussed with patient's daughter.  I do not think he can live on his own.  He will need home oxygen.  He will need rehab stay at discharge then think he will need long-term SNF.   Length of Stay: [place] AGACNP-BC  08/28/2022, 8:28 AM  Advanced Heart  Failure Team Pager [phone] (M-F; 7a - 5p)  Please contact [facility] for night-coverage after hours (5p -7a ) and weekends on [email or web address]   Patient seen with NP, agree with the above note.   No complaints today.  Denies dyspnea.  He remains in NSR.   General: NAD Neck: JVP 9 cm, no thyromegaly or thyroid nodule.  Lungs: Clear to auscultation bilaterally with normal respiratory effort. CV: Nondisplaced PMI.  Heart regular S1/S2, no S3/S4, no murmur.  No peripheral edema.   Abdomen: Soft, nontender, no hepatosplenomegaly, no distention.  Skin: Intact without lesions or rashes.  Neurologic: Alert and oriented x 3.  Psych: Normal affect. Extremities: No clubbing or cyanosis.  HEENT: Normal.   Now off oxygen.  Suspect mild volume overload, will replace his pm po Lasix with a dose of Lasix 80 mg IV.  Will increase po Lasix to 80 mg bid for now starting tomorrow.   Should be ready for rehab facility when a bed is available.   [name] 08/28/2022 12:55 PM

## 2022-08-28 NOTE — TOC Progression Note (Signed)
Transition of Care (TOC) - Progression Note    Patient Details  Name: Orange Hilligoss [name] MRN: [id number] Date of Birth: 23-Feb-1941  Transition of Care [facility]) CM/SW [place], [place] Phone Number: 08/28/2022, 12:38 PM  Clinical Narrative:     Patients insurance authorization has been approved for SNF and PTAR. SNF approval number # [id number]. PTAR approval number # [id number]. CSW informed MD. CSW will continue to follow and assist with patients dc planning needs.   Expected Discharge Plan: [place] Barriers to Discharge: Continued Medical Work up, Ship broker  Expected Discharge Plan and Services Expected Discharge Plan: [place] arrangements for the past 2 months: [place] Determinants of Health (SDOH) Interventions    Readmission Risk Interventions    07/04/2021    3:08 PM 07/04/2021    1:14 PM  Readmission Risk Prevention Plan  Transportation Screening Complete Complete  Medication Review (RN Care Manager) Referral to Pharmacy Referral to Pharmacy  PCP or Specialist appointment within 3-5 days of discharge Complete Complete  HRI or [place] Complete Complete  SW Recovery Care/Counseling Consult Complete   Palliative Care Screening Not [place] Patient Refused

## 2022-08-28 NOTE — Care Management Important Message (Signed)
Important Message  Patient Details  Name: Troy Preast [name] MRN: [id number] Date of Birth: 25-Aug-1941   Medicare Important Message Given:  Yes     Shelda Altes 08/28/2022, 12:01 PM

## 2022-08-28 NOTE — Progress Notes (Signed)
Pharmacy Antibiotic Note  Troy [name]. is a 81 y.o. male admitted on 08/24/2022 with  shortness of breath .  Pharmacy has been consulted for levaquin dosing.  Patient currently afebrile, wbc normal. Has been treated with azithromycin while admitted. New orders to start levofloxacin for pneumonia. His renal function has been relatively stable with a scr ~1.4. He renal function is borderline for daily dosing, will continue to follow.   No culture data, respiratory panels have been negative  Plan: Levofloxacin 500mg  q 24 hours  Height: 6\' 1"  (185.4 cm) Weight: 80.9 kg (178 lb 5.6 oz) IBW/kg (Calculated) : 79.9  Temp (24hrs), Avg:98.5 F (36.9 C), Min:97.9 F (36.6 C), Max:99 F (37.2 C)  Recent Labs  Lab 08/24/22 0137 08/25/22 0200 08/26/22 0215 08/27/22 0624 08/28/22 0301  WBC 5.5 6.5 6.3  --   --   CREATININE 1.95* 1.70* 1.54* 1.40* 1.44*    Estimated Creatinine Clearance: 45.5 mL/min (A) (by C-G formula based on SCr of 1.44 mg/dL (H)).    Allergies  Allergen Reactions   Niacin-Lovastatin Er Shortness Of Breath and Other (See Comments)    Dyspnea Flushing   Penicillins Hives, Shortness Of Breath and Other (See Comments)    Dyspnea Flushing Because of a history of documented adverse serious drug reaction;Medi Alert bracelet  is recommended PCN reaction causing immediate rash, facial/tongue/throat swelling, SOB or lightheadedness with hypotension: Yes PCN reaction causing severe rash involving mucus membranes or skin necrosis: No PCN reaction occurring within the last 10 years: NO PCN reaction that required hospitalization: NO   Sulfonamide Derivatives Hives, Shortness Of Breath and Other (See Comments)    Dyspnea  Flushing Because of a history of documented adverse serious drug reaction;Medi Alert bracelet  is recommended   Lipitor [Atorvastatin] Other (See Comments)    Myalgias Arthralgias     Thank you for allowing pharmacy to be a part of this  patient's care.  Erin Hearing PharmD., BCPS Clinical Pharmacist 08/28/2022 7:24 AM

## 2022-08-28 NOTE — Progress Notes (Signed)
Pt refused to be placed on cpap

## 2022-08-28 NOTE — Progress Notes (Signed)
Occupational Therapy Treatment Patient Details Name: Troy Arabie [name] MRN: [id number] DOB: 12/13/1940 Today's Date: 08/28/2022   History of present illness Pt is an 81 y/o male presenting on 11/24 with SOB. Admitted with acute respiratory failure with associated COPD exacerbation, AKI, and chronic diastolic CHF.  PMHx significant for CHF, A-fib, COPD, PAF, CKD IIIa, colon and lung cancer, glaucoma, PVD s/p stent, OSA on CPAP, and HLD.   OT comments  Troy [name] is making steady progress. Overall he required min A-min G for 158ft mobility with RW and 2x standing rest breaks. Pt continues to have short shuffling gait, and reports he is afraid to take larger steps due to low vision and unfamiliar environment. He required 1-2L O2 during functional tasks to maintain SpO2 >90%. He was able to rest on RW with good sats. Increased time spent adapting built up buttons on pt's call bell so he can access the RN, TV on/off, and volume controls. OT to continue to follow acutely. POC remains appropriate.    Recommendations for follow up therapy are one component of a multi-disciplinary discharge planning process, led by the attending physician.  Recommendations may be updated based on patient status, additional functional criteria and insurance authorization.    Follow Up Recommendations  Skilled nursing-short term rehab (<3 hours/day)     Assistance Recommended at Discharge Frequent or constant Supervision/Assistance  Patient can return home with the following  A lot of help with walking and/or transfers;A lot of help with bathing/dressing/bathroom;Assistance with cooking/housework;Direct supervision/assist for medications management;Direct supervision/assist for financial management;Assist for transportation;Help with stairs or ramp for entrance   Equipment Recommendations  None recommended by OT       Precautions / Restrictions Precautions Precautions: Fall;Other (comment) Precaution Comments: watch  O2, low vision Restrictions Weight Bearing Restrictions: No       Mobility Bed Mobility Overal bed mobility: Needs Assistance Bed Mobility: Supine to Sit     Supine to sit: Min assist     General bed mobility comments: increased time    Transfers Overall transfer level: Needs assistance Equipment used: Rolling walker (2 wheels) Transfers: Sit to/from Stand Sit to Stand: Min assist                 Balance Overall balance assessment: Needs assistance Sitting-balance support: No upper extremity supported, Feet supported Sitting balance-Leahy Scale: Fair     Standing balance support: Bilateral upper extremity supported, During functional activity Standing balance-Leahy Scale: Poor                             ADL either performed or assessed with clinical judgement   ADL Overall ADL's : Needs assistance/impaired                         Toilet Transfer: Minimal assistance;BSC/3in1;Rolling walker (2 wheels) Toilet Transfer Details (indicate cue type and reason): requires significant cues for low vision         Functional mobility during ADLs: Minimal assistance;Rolling walker (2 wheels);Cueing for sequencing;Cueing for safety General ADL Comments: cues for low vision and gati pattern. increased time spent modifying pt's call bell to access the RN call button, the TV on/off button and the volume.    Extremity/Trunk Assessment Upper Extremity Assessment Upper Extremity Assessment: Generalized weakness   Lower Extremity Assessment Lower Extremity Assessment: Defer to PT evaluation        Vision   Additional Comments: low  vision at baseline   Perception Perception Perception: Not tested   Praxis Praxis Praxis: Not tested    Cognition Arousal/Alertness: Awake/alert Behavior During Therapy: WFL for tasks assessed/performed Overall Cognitive Status: Impaired/Different from baseline Area of Impairment: Memory, Problem solving,  Awareness, Safety/judgement, Following commands                     Memory: Decreased short-term memory Following Commands: Follows one step commands consistently, Follows one step commands with increased time, Follows multi-step commands inconsistently Safety/Judgement: Decreased awareness of safety Awareness: Emergent Problem Solving: Slow processing, Difficulty sequencing, Requires verbal cues General Comments: patient oriented, follows simple commands but difficulty with mulitple step. Family reports different PLOF compared to patient and question pts history.              General Comments VSS on RA at rest, requires 1-2L [place] with mobility.    Pertinent Vitals/ Pain       Pain Assessment Pain Assessment: No/denies pain   Frequency  Min 2X/week        Progress Toward Goals  OT Goals(current goals can now be found in the care plan section)  Progress towards OT goals: Progressing toward goals  Acute Rehab OT Goals Patient Stated Goal: to get stronger OT Goal Formulation: With patient Time For Goal Achievement: 09/08/22 Potential to Achieve Goals: Good ADL Goals Pt Will Perform Grooming: with supervision;standing Pt Will Perform Lower Body Dressing: with supervision;sit to/from stand Pt Will Transfer to Toilet: with supervision;ambulating;bedside commode Pt Will Perform Toileting - Clothing Manipulation and hygiene: with supervision;sit to/from stand Additional ADL Goal #1: Pt will verbalize 3 fall prevention techniques to optimize safety during ADL routines.  Plan Discharge plan remains appropriate    Co-evaluation                 AM-PAC OT "6 Clicks" Daily Activity     Outcome Measure   Help from another person eating meals?: A Little Help from another person taking care of personal grooming?: A Little Help from another person toileting, which includes using toliet, bedpan, or urinal?: A Little Help from another person bathing (including washing,  rinsing, drying)?: A Lot Help from another person to put on and taking off regular upper body clothing?: A Little Help from another person to put on and taking off regular lower body clothing?: A Lot 6 Click Score: 16    End of Session Equipment Utilized During Treatment: Gait belt;Rolling walker (2 wheels);Oxygen  OT Visit Diagnosis: Other abnormalities of gait and mobility (R26.89);Muscle weakness (generalized) (M62.81)   Activity Tolerance Patient tolerated treatment well   Patient Left in bed;with call bell/phone within reach;with bed alarm set;with nursing/sitter in room;with family/visitor present   Nurse Communication Mobility status        Time: 1359-1440 OT Time Calculation (min): 41 min  Charges: OT General Charges $OT Visit: 1 Visit OT Treatments $Therapeutic Activity: 38-52 mins    Elliot Cousin 08/28/2022, 3:25 PM

## 2022-08-29 ENCOUNTER — Other Ambulatory Visit (HOSPITAL_COMMUNITY): Payer: Self-pay

## 2022-08-29 DIAGNOSIS — J189 Pneumonia, unspecified organism: Secondary | ICD-10-CM | POA: Diagnosis not present

## 2022-08-29 DIAGNOSIS — E1122 Type 2 diabetes mellitus with diabetic chronic kidney disease: Secondary | ICD-10-CM | POA: Diagnosis not present

## 2022-08-29 DIAGNOSIS — I130 Hypertensive heart and chronic kidney disease with heart failure and stage 1 through stage 4 chronic kidney disease, or unspecified chronic kidney disease: Secondary | ICD-10-CM | POA: Diagnosis not present

## 2022-08-29 DIAGNOSIS — F419 Anxiety disorder, unspecified: Secondary | ICD-10-CM | POA: Diagnosis not present

## 2022-08-29 DIAGNOSIS — I5043 Acute on chronic combined systolic (congestive) and diastolic (congestive) heart failure: Secondary | ICD-10-CM | POA: Diagnosis not present

## 2022-08-29 DIAGNOSIS — I5032 Chronic diastolic (congestive) heart failure: Secondary | ICD-10-CM | POA: Diagnosis not present

## 2022-08-29 DIAGNOSIS — J441 Chronic obstructive pulmonary disease with (acute) exacerbation: Secondary | ICD-10-CM | POA: Diagnosis not present

## 2022-08-29 DIAGNOSIS — I5031 Acute diastolic (congestive) heart failure: Secondary | ICD-10-CM | POA: Diagnosis not present

## 2022-08-29 DIAGNOSIS — Z7901 Long term (current) use of anticoagulants: Secondary | ICD-10-CM | POA: Diagnosis not present

## 2022-08-29 DIAGNOSIS — I2510 Atherosclerotic heart disease of native coronary artery without angina pectoris: Secondary | ICD-10-CM | POA: Diagnosis not present

## 2022-08-29 DIAGNOSIS — R531 Weakness: Secondary | ICD-10-CM | POA: Diagnosis not present

## 2022-08-29 DIAGNOSIS — J9600 Acute respiratory failure, unspecified whether with hypoxia or hypercapnia: Secondary | ICD-10-CM | POA: Diagnosis not present

## 2022-08-29 DIAGNOSIS — Z959 Presence of cardiac and vascular implant and graft, unspecified: Secondary | ICD-10-CM | POA: Diagnosis not present

## 2022-08-29 DIAGNOSIS — I480 Paroxysmal atrial fibrillation: Secondary | ICD-10-CM | POA: Diagnosis not present

## 2022-08-29 DIAGNOSIS — F32A Depression, unspecified: Secondary | ICD-10-CM | POA: Diagnosis not present

## 2022-08-29 DIAGNOSIS — R410 Disorientation, unspecified: Secondary | ICD-10-CM | POA: Diagnosis not present

## 2022-08-29 DIAGNOSIS — N1832 Chronic kidney disease, stage 3b: Secondary | ICD-10-CM | POA: Diagnosis not present

## 2022-08-29 DIAGNOSIS — I482 Chronic atrial fibrillation, unspecified: Secondary | ICD-10-CM | POA: Diagnosis not present

## 2022-08-29 DIAGNOSIS — R2689 Other abnormalities of gait and mobility: Secondary | ICD-10-CM | POA: Diagnosis not present

## 2022-08-29 DIAGNOSIS — N400 Enlarged prostate without lower urinary tract symptoms: Secondary | ICD-10-CM | POA: Diagnosis not present

## 2022-08-29 DIAGNOSIS — R519 Headache, unspecified: Secondary | ICD-10-CM | POA: Diagnosis not present

## 2022-08-29 DIAGNOSIS — Z9989 Dependence on other enabling machines and devices: Secondary | ICD-10-CM | POA: Diagnosis not present

## 2022-08-29 DIAGNOSIS — J9601 Acute respiratory failure with hypoxia: Secondary | ICD-10-CM | POA: Diagnosis not present

## 2022-08-29 DIAGNOSIS — Z7984 Long term (current) use of oral hypoglycemic drugs: Secondary | ICD-10-CM | POA: Diagnosis not present

## 2022-08-29 DIAGNOSIS — I509 Heart failure, unspecified: Secondary | ICD-10-CM | POA: Diagnosis not present

## 2022-08-29 DIAGNOSIS — I714 Abdominal aortic aneurysm, without rupture, unspecified: Secondary | ICD-10-CM | POA: Diagnosis not present

## 2022-08-29 DIAGNOSIS — G4733 Obstructive sleep apnea (adult) (pediatric): Secondary | ICD-10-CM | POA: Diagnosis not present

## 2022-08-29 DIAGNOSIS — F39 Unspecified mood [affective] disorder: Secondary | ICD-10-CM | POA: Diagnosis not present

## 2022-08-29 DIAGNOSIS — I739 Peripheral vascular disease, unspecified: Secondary | ICD-10-CM | POA: Diagnosis not present

## 2022-08-29 LAB — CBC
HCT: 39.2 % (ref 39.0–52.0)
Hemoglobin: 14 g/dL (ref 13.0–17.0)
MCH: 31.1 pg (ref 26.0–34.0)
MCHC: 35.7 g/dL (ref 30.0–36.0)
MCV: 87.1 fL (ref 80.0–100.0)
Platelets: 259 10*3/uL (ref 150–400)
RBC: 4.5 MIL/uL (ref 4.22–5.81)
RDW: 14.5 % (ref 11.5–15.5)
WBC: 5.5 10*3/uL (ref 4.0–10.5)
nRBC: 0 % (ref 0.0–0.2)

## 2022-08-29 LAB — BASIC METABOLIC PANEL
Anion gap: 11 (ref 5–15)
BUN: 41 mg/dL — ABNORMAL HIGH (ref 8–23)
CO2: 22 mmol/L (ref 22–32)
Calcium: 8.6 mg/dL — ABNORMAL LOW (ref 8.9–10.3)
Chloride: 99 mmol/L (ref 98–111)
Creatinine, Ser: 1.54 mg/dL — ABNORMAL HIGH (ref 0.61–1.24)
GFR, Estimated: 45 mL/min — ABNORMAL LOW (ref >60)
Glucose, Bld: 143 mg/dL — ABNORMAL HIGH (ref 70–99)
Potassium: 4.1 mmol/L (ref 3.5–5.1)
Sodium: 132 mmol/L — ABNORMAL LOW (ref 135–145)

## 2022-08-29 LAB — GLUCOSE, CAPILLARY: Glucose-Capillary: 118 mg/dL — ABNORMAL HIGH (ref 70–99)

## 2022-08-29 LAB — LIPID PANEL
Cholesterol: 105 mg/dL (ref 0–200)
HDL: 34 mg/dL — ABNORMAL LOW (ref >40)
LDL Cholesterol: 42 mg/dL (ref 0–99)
Total CHOL/HDL Ratio: 3.1 RATIO
Triglycerides: 146 mg/dL (ref <150)
VLDL: 29 mg/dL (ref 0–40)

## 2022-08-29 LAB — PROCALCITONIN: Procalcitonin: 0.41 ng/mL

## 2022-08-29 MED ORDER — ICOSAPENT ETHYL 1 G PO CAPS: 2.0000 g | ORAL_CAPSULE | Freq: Two times a day (BID) | ORAL | Status: DC

## 2022-08-29 MED ORDER — APIXABAN 2.5 MG PO TABS: 2.5000 mg | ORAL_TABLET | Freq: Two times a day (BID) | ORAL | 0 refills | Status: DC

## 2022-08-29 MED ORDER — LEVOFLOXACIN 500 MG PO TABS: 500.0000 mg | ORAL_TABLET | Freq: Every day | ORAL | 0 refills | Status: AC

## 2022-08-29 MED ORDER — ICOSAPENT ETHYL 1 G PO CAPS
2.0000 g | ORAL_CAPSULE | Freq: Two times a day (BID) | ORAL | Status: DC
  Filled 2022-08-29: qty 2

## 2022-08-29 MED ORDER — FUROSEMIDE 20 MG PO TABS: 80.0000 mg | ORAL_TABLET | Freq: Two times a day (BID) | ORAL | 3 refills | Status: DC

## 2022-08-29 MED ORDER — SPIRONOLACTONE 25 MG PO TABS: 25.0000 mg | ORAL_TABLET | Freq: Every day | ORAL | 0 refills | Status: DC

## 2022-08-29 NOTE — TOC Benefit Eligibility Note (Signed)
Patient Teacher, English as a foreign language completed.    The patient is currently admitted and upon discharge could be taking icosapent Etyl (Vascepa) 1 g capsule.  The current 30 day co-pay is $13.06.   The patient is insured through [employer] [place] Patient Advocate Specialist [place] Patient Advocate Team Direct Number:[phone] Fax: [phone]

## 2022-08-29 NOTE — Progress Notes (Signed)
Patient ID: Troy Pinney [name], male   DOB: September 28, 1941, 81 y.o.   MRN: [id number]     Advanced Heart Failure Rounding Note  PCP-Cardiologist: [name], MD   Subjective:    He is off oxygen this morning.  Good diuresis yesterday, weight down.  Creatinine mildly higher at 1.54.  SBP around 150 generally.   He is now off oral steroids and azithromycin, but with PCT elevated at 0.41, he was started on levofloxacin for possible component of PNA (RLL infiltrate on CXR this admission).   Objective:   Weight Range: 79.8 kg Body mass index is 23.22 kg/m.   Vital Signs:   Temp:  [97.6 F (36.4 C)-98.2 F (36.8 C)] 97.6 F (36.4 C) (11/29 0512) Pulse Rate:  [59-67] 59 (11/29 0512) Resp:  [16-20] 20 (11/29 0512) BP: (153-161)/(75-79) 153/75 (11/29 0512) SpO2:  [93 %-94 %] 93 % (11/29 0804) Weight:  [79.8 kg] 79.8 kg (11/29 0512) Last BM Date : 08/23/22  Weight change: Filed Weights   08/24/22 1822 08/28/22 0500 08/29/22 0512  Weight: 80.5 kg 80.9 kg 79.8 kg    Intake/Output:   Intake/Output Summary (Last 24 hours) at 08/29/2022 0948 Last data filed at 08/29/2022 0500 Gross per 24 hour  Intake 240 ml  Output 3200 ml  Net -2960 ml      Physical Exam    General: NAD Neck: JVP 8 cm, no thyromegaly or thyroid nodule.  Lungs: Decreased BS right base.  CV: Nondisplaced PMI.  Heart regular S1/S2, no S3/S4, no murmur.  No peripheral edema.   Abdomen: Soft, nontender, no hepatosplenomegaly, no distention.  Skin: Intact without lesions or rashes.  Neurologic: Alert and oriented x 3.  Psych: Normal affect. Extremities: No clubbing or cyanosis.  HEENT: Normal.    Telemetry   SR w/ PACs 70s (Personally reviewed)    Labs    CBC Recent Labs    08/29/22 0341  WBC 5.5  HGB 14.0  HCT 39.2  MCV 87.1  PLT 010   Basic Metabolic Panel Recent Labs    08/28/22 0301 08/29/22 0341  NA 136 132*  K 3.8 4.1  CL 102 99  CO2 23 22  GLUCOSE 128* 143*  BUN 36* 41*   CREATININE 1.44* 1.54*  CALCIUM 8.6* 8.6*   Liver Function Tests No results for input(s): "AST", "ALT", "ALKPHOS", "BILITOT", "PROT", "ALBUMIN" in the last 72 hours.  No results for input(s): "LIPASE", "AMYLASE" in the last 72 hours. Cardiac Enzymes No results for input(s): "CKTOTAL", "CKMB", "CKMBINDEX", "TROPONINI" in the last 72 hours.  BNP: BNP (last 3 results) Recent Labs    07/31/22 1444 08/14/22 1619 08/24/22 0137  BNP 227.9* 186.5* 256.7*    ProBNP (last 3 results) No results for input(s): "PROBNP" in the last 8760 hours.   D-Dimer No results for input(s): "DDIMER" in the last 72 hours. Hemoglobin A1C No results for input(s): "HGBA1C" in the last 72 hours. Fasting Lipid Panel No results for input(s): "CHOL", "HDL", "LDLCALC", "TRIG", "CHOLHDL", "LDLDIRECT" in the last 72 hours. Thyroid Function Tests No results for input(s): "TSH", "T4TOTAL", "T3FREE", "THYROIDAB" in the last 72 hours.  Invalid input(s): "FREET3"  Other results:   Imaging    No results found.   Medications:     Scheduled Medications:  albuterol  2.5 mg Nebulization Once   amLODipine  10 mg Oral Daily   apixaban  2.5 mg Oral BID   brinzolamide  1 drop Both Eyes TID   And   brimonidine  1 drop Both Eyes TID   empagliflozin  10 mg Oral Daily   escitalopram  10 mg Oral Daily   fluticasone furoate-vilanterol  1 puff Inhalation Daily   furosemide  80 mg Oral BID   insulin aspart  0-5 Units Subcutaneous QHS   insulin aspart  0-9 Units Subcutaneous TID WC   latanoprost  1 drop Both Eyes QHS   levofloxacin  500 mg Oral Daily   melatonin  20 mg Oral QHS   metoprolol succinate  25 mg Oral Daily   montelukast  10 mg Oral QHS   pantoprazole  40 mg Oral Daily   rosuvastatin  40 mg Oral Daily   sodium chloride flush  3 mL Intravenous Q12H   spironolactone  25 mg Oral Daily   umeclidinium bromide  1 puff Inhalation Daily    Infusions:   PRN Medications: acetaminophen **OR**  acetaminophen, albuterol, bisacodyl, guaiFENesin, hydrALAZINE, ondansetron **OR** ondansetron (ZOFRAN) IV, oxyCODONE, polyethylene glycol, polyvinyl alcohol, zolpidem   Assessment/Plan   1. Chronic diastolic CHF: Echo in 37/16 with EF 55-60%, mild LVH, mildly dysfunctional RV.  Echo in 11/23 with EF 50-55%, RV normal, IVC dilated. Good diuresis yesterday, volume status improved.  - Continue spironolactone 25 mg daily.  - Continue Lasix 80 mg po bid.  - Continue Jardiance 10 mg daily. No GU symptoms. 2. Hyperlipidemia: 1/23 lipids with good LDL. - Continue Crestor 40 mg daily.  - Continue Vascepa 2 g bid. 3. Carotid stenosis: s/p R CEA.  Dopplers followed at [facility].  4. PAD: s/p patch angioplasty to left fem-pop bypass in 11/16.  Followed at [facility].  Peripheral arterial dopplers in 3/23 with >50% right iliac stenosis, but no rest pain or pedal ulcerations.  Rare mild claudication.  - Follows with [facility].  5. AAA: Stable on last [name], followed at [facility].   6. AECOPD: No longer smoking. PFTs in 1/18 showed moderately severe obstructive defect. CT chest in 11/18 showed emphysema.  His oxygen saturation was low at admission, suspect due to progression of COPD vs acute exacerbation.  Viral panel and COVID negative. He was treated for COPD exacerbation with azithromycin, nebs, and prednisone. Now off prednisone and azithromycin and on room air.  However, PCT persistently elevated at 0.41, and with RLL infiltrate on CXR it was decided to treat him with a course of levofloxacin.  - Continue levofloxacin per primary team. Needs to be renally dosed.  7. Atrial fibrillation: S/p atrial fibrillation ablation in 7/20 and redo in 5/21.  He has been primarily in NSR since redo ablation.  Back in slow atrial fibrillation 9/23 and s/p DCCV 10/23 to NSR. NSR today.  -  He saw EP recently, poor candidate for redo ablation.  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 GFR would  limit Tikosyn to the lowest dose, probably not a good choice for him.  - Continue Toprol XL 25 mg daily.  - Continue Eliquis 2.5 mg bid. (Reduced dose with creatinine and age). - Continue to use CPAP.  8. CKD: Stage 3. Creatinine mildly higher at 1.54 today (had IV Lasix yesterday).  9. CAD: LHC in 11/18 with nonobstructive disease only.  Cardiolite in 6/21 with no ischemia/infarction. No chest pain. HS-TnI mildly elevated at admission with no trend, ?demand ischemia due to hypoxemia in setting of COPD exacerbation.  10. 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  He is no longer on methimazole, follows with endocrinology.     11. Colon cancer: Sigmoid adenocarcinoma, s/p  surgical resection. In remission.  12. HTN: Continue amlodipine and increase spironolactone daily. BP elevated but would be careful with lowering too aggressively due to fall risk (he is unsteady on his feet).  13. Pleural effusion: Chronic right pleural effusion, s/p right lower lobectomy.   14. Squamous cell lung cancer: S/p right lower lobectomy.  Follows with Dr. Valeta Harms.  15. Upper GI bleeding: In 5/23 due to duodenal ulcer. No abnormal bleeding. - hgb stable.  63. Neurology: Persistent HA, CT normal.   He will need rehab placement and likely long-term SNF.  From a cardiac standpoint, think he is ready to leave hospital.   Length of Stay: [place]   08/29/2022, 9:48 AM  Advanced Heart Failure Team Pager [phone] (M-F; 7a - 5p)  Please contact [facility] for night-coverage after hours (5p -7a ) and weekends on [email or web address]

## 2022-08-29 NOTE — Discharge Summary (Signed)
Physician Discharge Summary  Hooper Petteway [name] QQP:[id number] DOB: [birth year] DOA: 08/24/2022  PCP: Binnie Rail, MD  Admit date: 08/24/2022 Discharge date: 08/29/2022  Time spent: 40 minutes  Recommendations for Outpatient Follow-up:  Follow outpatient CBC/CMP  Follow blood pressure outpatient/volume status - started on spironolactone/lasix increased - watch volume/lytes/renal function - adjust as needed Holding losartan at this point at discharge, follow for possible resumption outpatient with cardiology Eliquis dose reduced due to renal function, follow Follow repeat chest imaging outpatient, being treated for pneumonia Needs vascular follow up outpatient Needs pulmonary follow up outpatient    Discharge Diagnoses:  Principal Problem:   Acute congestive heart failure ([place]) Active Problems:   Chronic renal disease, stage 3, moderately decreased glomerular filtration rate between 30-59 mL/min/1.73 square meter (HCC)   Glaucoma   PAF (paroxysmal atrial fibrillation) (HCC)   OSA (obstructive sleep apnea)   Benign essential HTN   Chronic diastolic CHF (congestive heart failure) ([place])   AKI (acute kidney injury) ([place])   COPD with acute exacerbation ([place])   Hyperlipidemia   DNR (do not resuscitate)   COPD exacerbation ([place])   Discharge Condition: stable  Diet recommendation: heart healthy, diabetic  Filed Weights   08/24/22 1822 08/28/22 0500 08/29/22 0512  Weight: 80.5 kg 80.9 kg 79.8 kg    History of present illness:  Schyler Counsell [name] is an 81 y.o. male with Duana Benedict history of Afib on eliquis; chronic HFpEF; stage 3 CKD; colon and lung cancer; COPD; glaucoma; HTN; HLD; PVD s/p stent; and OSA on CPAP who presented 11/24 with shortness of breath and peripheral swelling found to be hypoxemic. Treatment for acute hypoxic respiratory failure and AECOPD initiated on admission. Heart failure team has followed the patient as there was concern for exacerbation at admission.  Due to some volume overload suspected, IV lasix given and oral diuretic will be increased.    Due to increasing cough and persistent hypoxemia, CXR repeated 11/27 shows increased RLL opacity, PCT elevated to 0.58. Levaquin started for pneumonia after discussion with patient and family.    He's improved with antibiotics, diuresis, and treatment for COPD exacerbation.  Stable for discharge on 11/29.   See below for additional details  Hospital Course:  Assessment and Plan: Acute hypoxic respiratory failure due to AECOPD, suspected RLL pneumonia: CXR with chronic right pleural effusion/scarring, no acute cardiopulm process initially, repeat CXR due to worsening cough shows increase in RLL opacity. Covid, flu, full viral panel negative.  - doing well on RA today - Completed steroids and azithromycin (11/24 - 11/28) - With tenuous respiratory status, elevated PCT, increased RLL opacity, antibiotics started 11/28. Due to high severity PCN allergy, levaquin ordered after informed consent discussion of side effects/black box warning with daughter and patient. Plan 5 days.  Will complete at [facility]. - Continue breztri, singulair, albuterol at discharge   AKI on stage 3a CKD: Improved. Cr 1.95 >> 1.54 on the day of discharge - Resumed lasix and jardiance with CrCl > 91ml/min. Holding ARB at discharge.  Monitor on diuresis with lasix/spironolactone.    Acute on chronic HFpEF, HTN: Echo this admit not significantly changed from prior. EF 50-55%, LV endocardial border not optimally defined to evaluate regional wall motion, IVC dilated with <50% resp variability  - Cardiology consulted, appreciate Dr. Claris Gladden insights - will continue lasix 80 mg BID at discharge - holding losartan for now - continue jardiance  - Increased spironolactone to 25 mg.  - Continue norvasc   PAF: s/p  ablation July 2020, May 2021, had recurrence and said to be poor candidate for redo ablation per EP. s/p DCCV Oct 2023   -  Continue metoprolol  - Continue dose-reduced eliquis (age > 73, Cr > 1.5 though is very close to this cutoff).  Monitor outpatient, can adjust dose as indicated. - Avoid amiodarone due to concern for pulmonary toxicity.  - Tikosyn not ideal with renal dysfunction.   Acute delirium, suspect progressive cognitive impairment: Unclear what baseline will be after acute episode. No focal deficits.  - Continue delirium precautions.  - follow outpatient and w/u additionally as needed   Deconditioning: Severe, no longer safe to discharge home at this time.  - d/c to SNF   CAD, demand myocardial ischemia: Mildly elevated Tn at admission in setting of hypoxemia. No anginal chest pain. Nonobstructive disease at last cath.  - Continue metoprolol, anticoagulation, rosuvastatin 40mg . Last LDL is 34. - Defer further work up at this time per cardiology.   History of squamous cell lung cancer s/p right lower lobectomy with chronic right pleural effusion:  - Continue outpatient follow up with Dr. Valeta Harms (also following Jager Koska non-avid by PET RUL nodule).    Headache: Had head CT for this on presentation (without acute findings). No current complaints.  - Continue monitoring    HTN -Holding losartan at discharge -continue lasix, he's been started on spironolactone -Continue metprolol, amlodipine   HLD - Continue crestor   T2DM: HbA1c 6.8%   - resume jardiance   Glaucoma: With advanced visual impairment - Continue latanoprost, simbrinza, ophthalmology follow up.   AAA: PET scan showed 4.2cm July 2023. By U/S it was 3.7cm March 2023, was 3.3cm Jan 2019.  - The importance of follow up with vascular surgery was stressed to the patient and daughter.   PAD: s/p patch angioplasty to left fem-pop bypass in 11/16. Peripheral arterial dopplers in 3/23 with >50% right iliac stenosis, but no rest pain or pedal ulcerations.  - Follow up with [facility] outpatient   Carotid stenosis: s/p right CEA.  - Follow up with [facility]  outpatient   Mood disorder:  - Continue lexapro, melatonin   OSA - Continue CPAP   DNR: Per discussion with admitting provider     Procedures: Echo IMPRESSIONS     1. Left ventricular ejection fraction, by estimation, is 50 to 55%. The  left ventricle has low normal function. Left ventricular endocardial  border not optimally defined to evaluate regional wall motion. Left  ventricular diastolic parameters are  indeterminate.   2. Right ventricular systolic function is normal. The right ventricular  size is normal. Tricuspid regurgitation signal is inadequate for assessing  PA pressure.   3. Left atrial size was moderately dilated.   4. Right atrial size was moderately dilated.   5. The mitral valve is abnormal. No evidence of mitral valve  regurgitation. No evidence of mitral stenosis.   6. The aortic valve was not well visualized. Aortic valve regurgitation  is not visualized. No aortic stenosis is present.   7. The inferior vena cava is dilated in size with <50% respiratory  variability, suggesting right atrial pressure of 15 mmHg.   8. Difficult study; images largerly done in the subcostal view.    Consultations: cardiology  Discharge Exam: Vitals:   08/29/22 0804 08/29/22 1013  BP:  (!) 160/72  Pulse:  62  Resp:  20  Temp:  97.6 F (36.4 C)  SpO2: 93% 93%   Feeling well, comfortable with discharge Discussed  with daughter over phone  General: No acute distress. Cardiovascular: RRR Lungs: Clear to auscultation bilaterally Abdomen: Soft, nontender, nondistended Neurological: Alert and oriented 3. Moves all extremities 4 with equal strength. Cranial nerves II through XII grossly intact. Extremities: No clubbing or cyanosis. No edema.  Discharge Instructions   Discharge Instructions     (HEART FAILURE PATIENTS) Call MD:  Anytime you have any of the following symptoms: 1) 3 pound weight gain in 24 hours or 5 pounds in 1 week 2) shortness of breath, with  or without Katina Remick dry hacking cough 3) swelling in the hands, feet or stomach 4) if you have to sleep on extra pillows at night in order to breathe.   Complete by: As directed    Call MD for:  difficulty breathing, headache or visual disturbances   Complete by: As directed    Call MD for:  extreme fatigue   Complete by: As directed    Call MD for:  hives   Complete by: As directed    Call MD for:  persistant dizziness or light-headedness   Complete by: As directed    Call MD for:  persistant nausea and vomiting   Complete by: As directed    Call MD for:  redness, tenderness, or signs of infection (pain, swelling, redness, odor or green/yellow discharge around incision site)   Complete by: As directed    Call MD for:  severe uncontrolled pain   Complete by: As directed    Call MD for:  temperature >100.4   Complete by: As directed    Diet - low sodium heart healthy   Complete by: As directed    Discharge instructions   Complete by: As directed    You were seen for Nakhia Levitan COPD and Dezi Schaner heart failure exacerbation.  You're being discharged on levaquin for pneumonia as well.  You should have repeat chest imaging in [place] F[place] few weeks with your PCP.  We've made some adjustments to your heart failure regimen.  You've been increased to 80 mg lasix twice daily.  You've been started on spironolactone at 25 mg daily.  HOLD your losartan for now until you're told to resume this with your cardiologist.  I decreased your eliquis to 2.5 mg twice daily due to your age and kidney function.  You're borderline for this dose reduction so this should be monitored going forward.  Follow outpatient with cardiology.  You should have your routine follow up with pulmonology (Dr. Valeta Harms) and vascular (Dr. [name]).  Return for new, recurrent, or worsening symptoms.  Please ask your PCP to request records from this hospitalization so they know what was done and what the next steps will be.   Increase activity slowly   Complete  by: As directed       Allergies as of 08/29/2022       Reactions   Niacin-lovastatin Er Shortness Of Breath, Other (See Comments)   Dyspnea Flushing   Penicillins Hives, Shortness Of Breath, Other (See Comments)   Dyspnea Flushing Because of Beyonce Sawatzky history of documented adverse serious drug reaction;Medi Alert bracelet  is recommended PCN reaction causing immediate rash, facial/tongue/throat swelling, SOB or lightheadedness with hypotension: Yes PCN reaction causing severe rash involving mucus membranes or skin necrosis: No PCN reaction occurring within the last 10 years: NO PCN reaction that required hospitalization: NO   Sulfonamide Derivatives Hives, Shortness Of Breath, Other (See Comments)   Dyspnea  Flushing Because of Fredi Hurtado history of documented adverse serious drug reaction;Medi Alert  bracelet  is recommended   Lipitor [atorvastatin] Other (See Comments)   Myalgias Arthralgias         Medication List     STOP taking these medications    losartan 25 MG tablet Commonly known as: COZAAR       TAKE these medications    acetaminophen 325 MG tablet Commonly known as: TYLENOL Take 650 mg by mouth every 6 (six) hours as needed for moderate pain or headache.   albuterol 108 (90 Base) MCG/ACT inhaler Commonly known as: VENTOLIN HFA Inhale 2 puffs into the lungs every 6 (six) hours as needed for wheezing or shortness of breath.   amLODipine 10 MG tablet Commonly known as: NORVASC Take 1 tablet (10 mg total) by mouth daily.   apixaban 2.5 MG Tabs tablet Commonly known as: Eliquis Take 1 tablet (2.5 mg total) by mouth 2 (two) times daily. What changed:  medication strength how much to take   Breztri Aerosphere 160-9-4.8 MCG/ACT Aero Generic drug: Budeson-Glycopyrrol-Formoterol Inhale 2 puffs into the lungs in the morning and at bedtime. What changed: when to take this   Digestive Advantage Caps Take 1 capsule by mouth daily.   docusate sodium 100 MG  capsule Commonly known as: COLACE Take 100 mg by mouth 2 (two) times daily as needed (constipation).   escitalopram 10 MG tablet Commonly known as: LEXAPRO TAKE ONE TABLET BY MOUTH EVERY DAY   furosemide 20 MG tablet Commonly known as: LASIX Take 4 tablets (80 mg total) by mouth 2 (two) times daily. What changed: how much to take   Jardiance 10 MG Tabs tablet Generic drug: empagliflozin TAKE ONE TABLET BY MOUTH EVERY DAY BEFORE BREAKFAST. What changed: See the new instructions.   latanoprost 0.005 % ophthalmic solution Commonly known as: XALATAN Place 1 drop into both eyes at bedtime.   levofloxacin 500 MG tablet Commonly known as: LEVAQUIN Take 1 tablet (500 mg total) by mouth daily for 3 days. Start taking on: August 30, 2022   Melatonin 10 MG Tabs Take 20 mg by mouth at bedtime.   metoprolol succinate 25 MG 24 hr tablet Commonly known as: TOPROL-XL Take 1 tablet (25 mg total) by mouth daily. What changed: when to take this   montelukast 10 MG tablet Commonly known as: SINGULAIR TAKE ONE TABLET BY MOUTH AT BEDTIME   omeprazole 20 MG capsule Commonly known as: PRILOSEC Take 1 capsule (20 mg total) by mouth 2 (two) times daily.   OVER THE COUNTER MEDICATION Take 3 capsules by mouth daily. Balance of Nature Fruits   OVER THE COUNTER MEDICATION Take 3 capsules by mouth daily. Balance of Nature Veggies   potassium chloride SA 20 MEQ tablet Commonly known as: KLOR-CON M TAKE TWO TABLETS BY MOUTH IN THE MORNING AND TAKE ONE TABLET IN THE EVENING What changed: See the new instructions.   rosuvastatin 40 MG tablet Commonly known as: CRESTOR TAKE ONE TABLET BY MOUTH EVERY DAY   Simbrinza 1-0.2 % Susp Generic drug: Brinzolamide-Brimonidine Place 1 drop into both eyes 3 (three) times daily.   spironolactone 25 MG tablet Commonly known as: ALDACTONE Take 1 tablet (25 mg total) by mouth daily. Start taking on: August 30, 2022   Systane Ultra 0.4-0.3 %  Soln Generic drug: Polyethyl Glycol-Propyl Glycol Place 1 drop into both eyes daily as needed (dry eyes).       Allergies  Allergen Reactions   Niacin-Lovastatin Er Shortness Of Breath and Other (See Comments)    Dyspnea Flushing  Penicillins Hives, Shortness Of Breath and Other (See Comments)    Dyspnea Flushing Because of Dom Haverland history of documented adverse serious drug reaction;Medi Alert bracelet  is recommended PCN reaction causing immediate rash, facial/tongue/throat swelling, SOB or lightheadedness with hypotension: Yes PCN reaction causing severe rash involving mucus membranes or skin necrosis: No PCN reaction occurring within the last 10 years: NO PCN reaction that required hospitalization: NO   Sulfonamide Derivatives Hives, Shortness Of Breath and Other (See Comments)    Dyspnea  Flushing Because of Nicolas Sisler history of documented adverse serious drug reaction;Medi Alert bracelet  is recommended   Lipitor [Atorvastatin] Other (See Comments)    Myalgias Arthralgias       The results of significant diagnostics from this hospitalization (including imaging, microbiology, ancillary and laboratory) are listed below for reference.    Significant Diagnostic Studies: DG CHEST PORT 1 VIEW  Result Date: 08/27/2022 CLINICAL DATA:  Acute respiratory failure with hypoxia EXAM: PORTABLE CHEST 1 VIEW COMPARISON:  Radiograph 08/24/2022 FINDINGS: Unchanged cardiomediastinal silhouette. There is Brinna Divelbiss chronic small right pleural effusion. There is adjacent right lower lung airspace disease, slightly increased from prior exams. Left lung is clear. No pneumothorax. Bones are unchanged. IMPRESSION: Chronic small right pleural effusion with slightly increased adjacent right lower lung opacities which could be atelectasis or infection. Electronically Signed   By: Maurine Simmering M.D.   On: 08/27/2022 16:38   ECHOCARDIOGRAM COMPLETE  Result Date: 08/24/2022    ECHOCARDIOGRAM REPORT   Patient Name:   Decklyn Hyder [name] Date of Exam: 08/24/2022 Medical Rec #:  [id number]              Height:       73.0 in Accession #:    [id number]             Weight:       188.0 lb Date of Birth:  Jun 14, 1941             BSA:          2.096 m Patient Age:    68 years               BP:           145/68 mmHg Patient Gender: M                      HR:           78 bpm. Exam Location:  Inpatient Procedure: 2D Echo, Cardiac Doppler, Color Doppler and Intracardiac            Opacification Agent Indications:    CHF-Acute Diastolic M84.13  History:        Patient has prior history of Echocardiogram examinations, most                 recent 07/02/2021. CHF, PAD and TIA, Arrythmias:Atrial                 Fibrillation, Signs/Symptoms:Dyspnea and Syncope; Risk                 Factors:Sleep Apnea, Hypertension, Dyslipidemia and Former                 Smoker.  Sonographer:    Greer Pickerel Referring Phys: 8 RHONDA G BARRETT  Sonographer Comments: Image acquisition challenging due to COPD, Image acquisition challenging due to respiratory motion and Image acquisition challenging due to patient body habitus. IMPRESSIONS  1. Left ventricular ejection fraction, by  estimation, is 50 to 55%. The left ventricle has low normal function. Left ventricular endocardial border not optimally defined to evaluate regional wall motion. Left ventricular diastolic parameters are indeterminate.  2. Right ventricular systolic function is normal. The right ventricular size is normal. Tricuspid regurgitation signal is inadequate for assessing PA pressure.  3. Left atrial size was moderately dilated.  4. Right atrial size was moderately dilated.  5. The mitral valve is abnormal. No evidence of mitral valve regurgitation. No evidence of mitral stenosis.  6. The aortic valve was not well visualized. Aortic valve regurgitation is not visualized. No aortic stenosis is present.  7. The inferior vena cava is dilated in size with <50% respiratory variability, suggesting  right atrial pressure of 15 mmHg.  8. Difficult study; images largerly done in the subcostal view. FINDINGS  Left Ventricle: Left ventricular ejection fraction, by estimation, is 50 to 55%. The left ventricle has low normal function. Left ventricular endocardial border not optimally defined to evaluate regional wall motion. Definity contrast agent was given IV  to delineate the left ventricular endocardial borders. The left ventricular internal cavity size was normal in size. Suboptimal image quality limits for assessment of left ventricular hypertrophy. Left ventricular diastolic parameters are indeterminate. Right Ventricle: The right ventricular size is normal. No increase in right ventricular wall thickness. Right ventricular systolic function is normal. Tricuspid regurgitation signal is inadequate for assessing PA pressure. Left Atrium: Left atrial size was moderately dilated. Right Atrium: Right atrial size was moderately dilated. Pericardium: There is no evidence of pericardial effusion. Mitral Valve: The mitral valve is abnormal. Mild mitral annular calcification. No evidence of mitral valve regurgitation. No evidence of mitral valve stenosis. Tricuspid Valve: The tricuspid valve is grossly normal. Tricuspid valve regurgitation is trivial. No evidence of tricuspid stenosis. Aortic Valve: The aortic valve was not well visualized. Aortic valve regurgitation is not visualized. No aortic stenosis is present. Pulmonic Valve: The pulmonic valve was not well visualized. Pulmonic valve regurgitation is not visualized. Aorta: The aortic arch was not well visualized and the ascending aorta was not well visualized. Venous: The inferior vena cava is dilated in size with less than 50% respiratory variability, suggesting right atrial pressure of 15 mmHg. IAS/Shunts: The interatrial septum appears to be lipomatous. No atrial level shunt detected by color flow Doppler.   Diastology LV e' medial:    5.91 cm/s LV E/e' medial:   10.2 LV e' lateral:   8.05 cm/s LV E/e' lateral: 7.5  RIGHT VENTRICLE RV S prime:     8.81 cm/s TAPSE (M-mode): 1.8 cm LEFT ATRIUM           Index        RIGHT ATRIUM           Index LA Vol (A2C): 33.4 ml 15.94 ml/m  RA Area:     25.00 cm LA Vol (A4C): 84.5 ml 40.32 ml/m  RA Volume:   77.50 ml  36.98 ml/m  AORTIC VALVE LVOT Vmax:   82.00 cm/s LVOT Vmean:  52.500 cm/s LVOT VTI:    0.142 m MITRAL VALVE MV Area (PHT): 4.89 cm    SHUNTS MV Decel Time: 155 msec    Systemic VTI: 0.14 m MV E velocity: 60.20 cm/s MV Kongmeng Santoro velocity: 32.80 cm/s MV E/Oluwadara Gorman ratio:  1.84 Rudean Haskell MD Electronically signed by Rudean Haskell MD Signature Date/Time: 08/24/2022/5:07:05 PM    Final    CT HEAD WO CONTRAST (5MM)  Result Date: 08/24/2022 CLINICAL DATA:  Headache  with recent worsening in severity and frequency. EXAM: CT HEAD WITHOUT CONTRAST TECHNIQUE: Contiguous axial images were obtained from the base of the skull through the vertex without intravenous contrast. RADIATION DOSE REDUCTION: This exam was performed according to the departmental dose-optimization program which includes automated exposure control, adjustment of the mA and/or kV according to patient size and/or use of iterative reconstruction technique. COMPARISON:  02/12/2022 FINDINGS: Brain: Ventricles, cisterns and other CSF spaces are normal. There is chronic ischemic microvascular disease present. There is no mass, mass effect, shift of midline structures or acute hemorrhage. No evidence of acute infarction. Vascular: No hyperdense vessel or unexpected calcification. Skull: Normal. Negative for fracture or focal lesion. Sinuses/Orbits: No acute findings. Other: None. IMPRESSION: 1. No acute findings. 2. Chronic ischemic microvascular disease. Electronically Signed   By: Marin Olp M.D.   On: 08/24/2022 15:35   DG Chest Port 1 View  Result Date: 08/24/2022 CLINICAL DATA:  Shortness of breath EXAM: PORTABLE CHEST 1 VIEW COMPARISON:  08/14/2022  FINDINGS: Chronic right pleural effusion/scarring. Stable linear opacities right lung base. The left lung is clear. Unchanged cardiac and mediastinal contours. Subcutaneous moderate device at the left aspect of the cardiac silhouette. No acute osseous abnormality. IMPRESSION: Chronic right pleural effusion/scarring. No acute cardiopulmonary process. Electronically Signed   By: Merilyn Baba M.D.   On: 08/24/2022 02:22   DG Chest 2 View  Result Date: 08/16/2022 CLINICAL DATA:  Pt states he has had Darbie Biancardi cough and SOB x 2 weeks. EXAM: CHEST - 2 VIEW COMPARISON:  02/16/2022 and previous FINDINGS: Chronic right pleural effusion/scarring. Stable linear opacities in the right lung base. Left lung remains clear. Heart size and mediastinal contours are within normal limits. Subcutaneous monitoring device overlies the cardiac silhouette. Visualized bones unremarkable. IMPRESSION: Chronic right pleural effusion/scarring. No acute findings. Electronically Signed   By: Lucrezia Europe M.D.   On: 08/16/2022 10:45   CUP PACEART REMOTE DEVICE CHECK  Result Date: 08/14/2022 ILR summary report received. Battery status OK. Normal device function. No new symptom, tachy, brady, or pause episodes. 2 new AF episodes, longest duration 55min, controlled rates.  Burden 0.1%, Eliquis. Monthly summary reports and ROV/PRN LA   Microbiology: Recent Results (from the past 240 hour(s))  Respiratory (~20 pathogens) panel by PCR     Status: None   Collection Time: 08/26/22  9:25 AM   Specimen: Nasopharyngeal Swab; Respiratory  Result Value Ref Range Status   Adenovirus NOT DETECTED NOT DETECTED Final   Coronavirus 229E NOT DETECTED NOT DETECTED Final    Comment: (NOTE) The Coronavirus on the Respiratory Panel, DOES NOT test for the novel  Coronavirus (2019 nCoV)    Coronavirus HKU1 NOT DETECTED NOT DETECTED Final   Coronavirus NL63 NOT DETECTED NOT DETECTED Final   Coronavirus OC43 NOT DETECTED NOT DETECTED Final   Metapneumovirus  NOT DETECTED NOT DETECTED Final   Rhinovirus / Enterovirus NOT DETECTED NOT DETECTED Final   Influenza Airi Copado NOT DETECTED NOT DETECTED Final   Influenza B NOT DETECTED NOT DETECTED Final   Parainfluenza Virus 1 NOT DETECTED NOT DETECTED Final   Parainfluenza Virus 2 NOT DETECTED NOT DETECTED Final   Parainfluenza Virus 3 NOT DETECTED NOT DETECTED Final   Parainfluenza Virus 4 NOT DETECTED NOT DETECTED Final   Respiratory Syncytial Virus NOT DETECTED NOT DETECTED Final   Bordetella pertussis NOT DETECTED NOT DETECTED Final   Bordetella Parapertussis NOT DETECTED NOT DETECTED Final   Chlamydophila pneumoniae NOT DETECTED NOT DETECTED Final   Mycoplasma pneumoniae  NOT DETECTED NOT DETECTED Final    Comment: Performed at [facility], [place] [address] [place], [place] [address]  Resp Panel by RT-PCR (Flu Rakia Frayne&B, Covid) Anterior Nasal Swab     Status: None   Collection Time: 08/26/22  9:25 AM   Specimen: Anterior Nasal Swab  Result Value Ref Range Status   SARS Coronavirus 2 by RT PCR NEGATIVE NEGATIVE Final    Comment: (NOTE) SARS-CoV-2 target nucleic acids are NOT DETECTED.  The SARS-CoV-2 RNA is generally detectable in upper respiratory specimens during the acute phase of infection. The lowest concentration of SARS-CoV-2 viral copies this assay can detect is 138 copies/mL. Aideliz Garmany negative result does not preclude SARS-Cov-2 infection and should not be used as the sole basis for treatment or other patient management decisions. Ronelle Michie negative result may occur with  improper specimen collection/handling, submission of specimen other than nasopharyngeal swab, presence of viral mutation(s) within the areas targeted by this assay, and inadequate number of viral copies(<138 copies/mL). Haasini Patnaude negative result must be combined with clinical observations, patient history, and epidemiological information. The expected result is Negative.  Fact Sheet for Patients:   EntrepreneurPulse.com.au  Fact Sheet for Healthcare Providers:  IncredibleEmployment.be  This test is no t yet approved or cleared by the [place] FDA and  has been authorized for detection and/or diagnosis of SARS-CoV-2 by FDA under an Emergency Use Authorization (EUA). This EUA will remain  in effect (meaning this test can be used) for the duration of the COVID-19 declaration under Section 564(b)(1) of the Act, 21 U.S.C.section 360bbb-3(b)(1), unless the authorization is terminated  or revoked sooner.       Influenza Kindal Ponti by PCR NEGATIVE NEGATIVE Final   Influenza B by PCR NEGATIVE NEGATIVE Final    Comment: (NOTE) The Xpert Xpress SARS-CoV-2/FLU/RSV plus assay is intended as an aid in the diagnosis of influenza from Nasopharyngeal swab specimens and should not be used as Richards Pherigo sole basis for treatment. Nasal washings and aspirates are unacceptable for Xpert Xpress SARS-CoV-2/FLU/RSV testing.  Fact Sheet for Patients: EntrepreneurPulse.com.au  Fact Sheet for Healthcare Providers: IncredibleEmployment.be  This test is not yet approved or cleared by the [place] FDA and has been authorized for detection and/or diagnosis of SARS-CoV-2 by FDA under an Emergency Use Authorization (EUA). This EUA will remain in effect (meaning this test can be used) for the duration of the COVID-19 declaration under Section 564(b)(1) of the Act, 21 U.S.C. section 360bbb-3(b)(1), unless the authorization is terminated or revoked.  Performed at [facility], [place] [address], [place], [place] [address]      Labs: Basic Metabolic Panel: Recent Labs  Lab 08/25/22 0200 08/26/22 0215 08/27/22 0624 08/28/22 0301 08/29/22 0341  NA 137 134* 135 136 132*  K 4.0 4.0 3.4* 3.8 4.1  CL 103 100 98 102 99  CO2 22 22 23 23 22   GLUCOSE 164* 172* 132* 128* 143*  BUN 34* 41* 36* 36* 41*  CREATININE 1.70* 1.54* 1.40* 1.44*  1.54*  CALCIUM 8.8* 8.4* 8.6* 8.6* 8.6*  MG  --  2.5*  --   --   --   PHOS  --  3.5  --   --   --    Liver Function Tests: Recent Labs  Lab 08/26/22 0215  AST 23  ALT 18  ALKPHOS 54  BILITOT 0.7  PROT 6.2*  ALBUMIN 3.1*   No results for input(s): "LIPASE", "AMYLASE" in the last 168 hours. No results for input(s): "AMMONIA"  in the last 168 hours. CBC: Recent Labs  Lab 08/24/22 0137 08/25/22 0200 08/26/22 0215 08/29/22 0341  WBC 5.5 6.5 6.3 5.5  NEUTROABS 5.0  --  5.7  --   HGB 16.9 13.0 12.8* 14.0  HCT 50.0 39.3 37.9* 39.2  MCV 91.6 90.8 90.2 87.1  PLT 264 217 227 259   Cardiac Enzymes: No results for input(s): "CKTOTAL", "CKMB", "CKMBINDEX", "TROPONINI" in the last 168 hours. BNP: BNP (last 3 results) Recent Labs    07/31/22 1444 08/14/22 1619 08/24/22 0137  BNP 227.9* 186.5* 256.7*    ProBNP (last 3 results) No results for input(s): "PROBNP" in the last 8760 hours.  CBG: Recent Labs  Lab 08/28/22 0918 08/28/22 1242 08/28/22 1630 08/28/22 2159 08/29/22 0821  GLUCAP 139* 169* 222* 182* 118*       Signed:  Fayrene Helper MD.  [facility] 08/29/2022, 11:18 AM

## 2022-08-29 NOTE — TOC Transition Note (Addendum)
Transition of Care [facility]) - CM/SW Discharge Note   Patient Details  Name: Troy Ogle [name] MRN: [id number] Date of Birth: 28-Jan-1941  Transition of Care [facility]) CM/SW Contact:  Milas Gain, [place] Phone Number: 08/29/2022, 11:51 AM   Clinical Narrative:     MD informed CSW patient will transport by Daughter Troy [name]. CSW spoke with Troy [name] who confirmed she will transport patient to SNF. CSW informed Whitney at [facility].  Patient will DC to: [facility]  Anticipated DC date: 08/29/2022  Family notified: Troy [name]  Transport by: daughter Troy [name]  ?  Per MD patient ready for DC to [facility] . RN, patient, patient's family, and facility notified of DC. Patients daughter Troy [name] confirmed will bring patients cpap from home to facility.Discharge Summary sent to facility. RN given number for report tele# [phone] RM# [phone]. DC packet on chart. Patients daughter Troy [name] to transport patient to SNF.  CSW signing off.   Final next level of care: Skilled Nursing Facility Barriers to Discharge: No Barriers Identified   Patient Goals and CMS Choice   CMS Medicare.gov Compare Post Acute Care list provided to:: Patient Represenative (must comment) (Patients daughter Troy [name]) Choice offered to / list presented to : Adult Children (patients daughter)  Discharge Placement              Patient chooses bed at: [facility] Patient to be transferred to facility by: patients daufghter Name of family member notified: Troy [name] Patient and family notified of of transfer: 08/29/22  Discharge Plan and Services                                     Social Determinants of Health (SDOH) Interventions     Readmission Risk Interventions    07/04/2021    3:08 PM 07/04/2021    1:14 PM  Readmission Risk Prevention Plan  Transportation Screening Complete Complete  Medication Review (RN Care Manager) Referral to Pharmacy Referral to Pharmacy  PCP or Specialist appointment  within 3-5 days of discharge Complete Complete  HRI or [place] Complete Complete  SW Recovery Care/Counseling Consult Complete   Palliative Care Screening Not [place] Patient Refused

## 2022-08-31 DIAGNOSIS — J189 Pneumonia, unspecified organism: Secondary | ICD-10-CM | POA: Diagnosis not present

## 2022-08-31 DIAGNOSIS — G4733 Obstructive sleep apnea (adult) (pediatric): Secondary | ICD-10-CM | POA: Diagnosis not present

## 2022-08-31 DIAGNOSIS — I482 Chronic atrial fibrillation, unspecified: Secondary | ICD-10-CM | POA: Diagnosis not present

## 2022-08-31 DIAGNOSIS — Z9989 Dependence on other enabling machines and devices: Secondary | ICD-10-CM | POA: Diagnosis not present

## 2022-08-31 DIAGNOSIS — I5043 Acute on chronic combined systolic (congestive) and diastolic (congestive) heart failure: Secondary | ICD-10-CM | POA: Diagnosis not present

## 2022-08-31 DIAGNOSIS — N1832 Chronic kidney disease, stage 3b: Secondary | ICD-10-CM | POA: Diagnosis not present

## 2022-08-31 DIAGNOSIS — R531 Weakness: Secondary | ICD-10-CM | POA: Diagnosis not present

## 2022-08-31 DIAGNOSIS — Z7984 Long term (current) use of oral hypoglycemic drugs: Secondary | ICD-10-CM | POA: Diagnosis not present

## 2022-08-31 DIAGNOSIS — F32A Depression, unspecified: Secondary | ICD-10-CM | POA: Diagnosis not present

## 2022-08-31 DIAGNOSIS — J9601 Acute respiratory failure with hypoxia: Secondary | ICD-10-CM | POA: Diagnosis not present

## 2022-08-31 DIAGNOSIS — E1122 Type 2 diabetes mellitus with diabetic chronic kidney disease: Secondary | ICD-10-CM | POA: Diagnosis not present

## 2022-08-31 DIAGNOSIS — R2689 Other abnormalities of gait and mobility: Secondary | ICD-10-CM | POA: Diagnosis not present

## 2022-09-05 ENCOUNTER — Encounter (HOSPITAL_COMMUNITY): Payer: PPO

## 2022-09-13 ENCOUNTER — Telehealth: Payer: Self-pay

## 2022-09-13 NOTE — Telephone Encounter (Signed)
Spoke to pt's daughter who was wanting to schedule pt an appt s/p his hospitalization. Pt is due for his annual f/u with studies here in March. Per MD, pt can be seen sooner if the family is requesting that or he can wait until March 2024, based on his last few AAA studies/measurements. Pt's daughter was given the choice and she has chosen to wait until next March. I have scheduled these appts and she is aware. They will call if anything changes. No further questions/concerns at this time.

## 2022-09-17 ENCOUNTER — Ambulatory Visit (INDEPENDENT_AMBULATORY_CARE_PROVIDER_SITE_OTHER): Payer: PPO

## 2022-09-17 DIAGNOSIS — I480 Paroxysmal atrial fibrillation: Secondary | ICD-10-CM

## 2022-09-18 LAB — CUP PACEART REMOTE DEVICE CHECK
Date Time Interrogation Session: 20231217232437
Implantable Pulse Generator Implant Date: 20200709

## 2022-09-26 ENCOUNTER — Telehealth: Payer: Self-pay

## 2022-09-26 ENCOUNTER — Ambulatory Visit (HOSPITAL_COMMUNITY)
Admission: RE | Admit: 2022-09-26 | Discharge: 2022-09-26 | Disposition: A | Discharge status: Home / Self Care | Payer: PPO | Source: Ambulatory Visit | Attending: Adult Health | Admitting: Adult Health

## 2022-09-26 ENCOUNTER — Encounter (HOSPITAL_COMMUNITY): Payer: Self-pay

## 2022-09-26 VITALS — BP 146/80 | HR 78 | Wt 177.0 lb

## 2022-09-26 DIAGNOSIS — Z7901 Long term (current) use of anticoagulants: Secondary | ICD-10-CM | POA: Insufficient documentation

## 2022-09-26 DIAGNOSIS — I480 Paroxysmal atrial fibrillation: Secondary | ICD-10-CM

## 2022-09-26 DIAGNOSIS — I5032 Chronic diastolic (congestive) heart failure: Secondary | ICD-10-CM | POA: Insufficient documentation

## 2022-09-26 DIAGNOSIS — Z8673 Personal history of transient ischemic attack (TIA), and cerebral infarction without residual deficits: Secondary | ICD-10-CM | POA: Diagnosis not present

## 2022-09-26 DIAGNOSIS — Z8616 Personal history of COVID-19: Secondary | ICD-10-CM | POA: Insufficient documentation

## 2022-09-26 DIAGNOSIS — J449 Chronic obstructive pulmonary disease, unspecified: Secondary | ICD-10-CM | POA: Insufficient documentation

## 2022-09-26 DIAGNOSIS — Z8719 Personal history of other diseases of the digestive system: Secondary | ICD-10-CM | POA: Insufficient documentation

## 2022-09-26 DIAGNOSIS — I739 Peripheral vascular disease, unspecified: Secondary | ICD-10-CM | POA: Insufficient documentation

## 2022-09-26 DIAGNOSIS — N183 Chronic kidney disease, stage 3 unspecified: Secondary | ICD-10-CM | POA: Diagnosis not present

## 2022-09-26 DIAGNOSIS — E785 Hyperlipidemia, unspecified: Secondary | ICD-10-CM | POA: Insufficient documentation

## 2022-09-26 DIAGNOSIS — Z79899 Other long term (current) drug therapy: Secondary | ICD-10-CM | POA: Insufficient documentation

## 2022-09-26 DIAGNOSIS — I2510 Atherosclerotic heart disease of native coronary artery without angina pectoris: Secondary | ICD-10-CM | POA: Insufficient documentation

## 2022-09-26 DIAGNOSIS — G4733 Obstructive sleep apnea (adult) (pediatric): Secondary | ICD-10-CM | POA: Diagnosis not present

## 2022-09-26 DIAGNOSIS — I4819 Other persistent atrial fibrillation: Secondary | ICD-10-CM | POA: Diagnosis not present

## 2022-09-26 DIAGNOSIS — Z87891 Personal history of nicotine dependence: Secondary | ICD-10-CM | POA: Insufficient documentation

## 2022-09-26 DIAGNOSIS — I130 Hypertensive heart and chronic kidney disease with heart failure and stage 1 through stage 4 chronic kidney disease, or unspecified chronic kidney disease: Secondary | ICD-10-CM | POA: Insufficient documentation

## 2022-09-26 DIAGNOSIS — Z902 Acquired absence of lung [part of]: Secondary | ICD-10-CM | POA: Diagnosis not present

## 2022-09-26 DIAGNOSIS — Z85118 Personal history of other malignant neoplasm of bronchus and lung: Secondary | ICD-10-CM | POA: Diagnosis not present

## 2022-09-26 DIAGNOSIS — Z85038 Personal history of other malignant neoplasm of large intestine: Secondary | ICD-10-CM | POA: Diagnosis not present

## 2022-09-26 DIAGNOSIS — Z7984 Long term (current) use of oral hypoglycemic drugs: Secondary | ICD-10-CM | POA: Insufficient documentation

## 2022-09-26 DIAGNOSIS — I714 Abdominal aortic aneurysm, without rupture, unspecified: Secondary | ICD-10-CM | POA: Diagnosis not present

## 2022-09-26 LAB — BASIC METABOLIC PANEL
Anion gap: 9 (ref 5–15)
BUN: 14 mg/dL (ref 8–23)
CO2: 28 mmol/L (ref 22–32)
Calcium: 8.9 mg/dL (ref 8.9–10.3)
Chloride: 101 mmol/L (ref 98–111)
Creatinine, Ser: 1.58 mg/dL — ABNORMAL HIGH (ref 0.61–1.24)
GFR, Estimated: 44 mL/min — ABNORMAL LOW (ref >60)
Glucose, Bld: 120 mg/dL — ABNORMAL HIGH (ref 70–99)
Potassium: 4.1 mmol/L (ref 3.5–5.1)
Sodium: 138 mmol/L (ref 135–145)

## 2022-09-26 NOTE — Progress Notes (Signed)
Patient ID: Troy Petties [name], male   DOB: 1941-02-14, 81 y.o.   MRN: [id number] PCP: Dr. Quay Burow Cardiology: [name]  Mr Troy [name] 81 y.o. with history of HTN, COPD, active smoking/COPD, carotid stenosis s/p right CEA, paroxysmal atrial fibrillation, and PAD.   He does not have known obstructive CAD but is at high risk for CAD based on his comorbidities.  Lexiscan Cardiolite in 3/14 showed no ischemia or infarction and echo in 3/14 showed normal EF.  He had a left fem-pop bypass at the [facility] in the '90s.  He is followed at [facility] for PAD. He has chronic right calf/thigh/buttocks claudication that is unchanged over the last few years and follows regularly at [facility].  He has had right lower lobectomy for lung cancer.  He continues to stay off cigarettes.    At a prior appointment, he was in atrial fibrillation but did not realize it. Dr Aundra Dubin  started him on Eliquis and diltiazem CD, and he spontaneously converted to NSR.  In 5/15, he was at [facility] and felt "strange" one day: fatigued, weak, short of breath.  He went to the ER and was in atrial fibrillation with HR in 80s-90s.  He spontaneously converted to NSR in the ER.  He felt back to normal after converting to NSR.  Started on Multaq 400 mg bid.  With CHF, he was eventually transitioned over to amiodarone.   He had patch angioplasty revision of left fem-pop bypass in 11/16.  Now with minimal claudication.  He follows with [facility].     He had a Cardiolite and echo in 7/17 that were unremarkable.  Repeat Cardiolite in 12/17 showed no ischemia, echo was uninterpretable.  Cardiac MRI was therefore done in 1/18, showing EF 66% with normal-appearing RV, no late gadolinium enhancement.   Given increased exertional dyspnea and a defect on Cardiolite, he had LHC in 11/18.  This showed no obstructive CAD. He had a chest CT done given concern for possible amiodarone lung toxicity with increased dyspnea. This showed emphysema, no ILD.    In 5/19, he  developed a probable viral syndrome with dehydration and had a presyncopal episode.  He was orthostatic in the hospital.  He was noted to be in atrial fibrillation this admission which remained persistent.  He had a TEE-guided DCCV back to NSR.   In 9/19, he was admitted with GI bleeding and found to have a sigmoid mass on colonoscopy, path showed sigmoid adenocarcinoma.  He was noted to have right paratracheal lymphadenopathy but EUS with biopsy in 10/19 did not show malignancy.  He had surgical resection of colon cancer.   Amiodarone was stopped with elevated ESR and increased dyspnea.  He was also found to have hyperthyroidism. He was treated with methimazole by endocrinology.    He had atrial fibrillation ablation in 7/20.   Echo in 4/21 showed EF 60-65%, normal RV.    In 5/21, due to high atrial fibrillation burden, he had redo atrial fibrillation ablation.   Cardiolite in 6/21 showed EF 70%, no ischemia/infarction.   He was admitted in 10/22 with COVID-19 and COPD exacerbation.  He had a thoracentesis, cytology was negative for cancerous cells.  Echo in 10/22 showed EF 55-60%, moderate LVH, mildly decreased RV systolic function.   He was found to have choledocholithiasis with elevated LFTs, had ERCP with sphincterotomy in 5/23, refused cholecystectomy.  He was also admitted in 5/23 with upper GI bleeding.  He was found to have a duodenal ulcer  on EGD.   Follow up 8/23, found to be in atrial fibrillation with slow ventricular rate. Mildly volume overloaded and instructed to increase Lasix to 80 bid x 3 days then Lasix 80/60. S/p DCCV to NSR on 07/10/22. SCr noted to be 2.19, Lasix decreased to 60 bid and losartan stopped. Eliquis dose decreased to 2.5 bid.  Admitted 08/2022 with AECOPD, CAP, and A/C HFpEF. Treated with antibiotics, nebs, and steroids. Echo with EF 50-55%, RV normal, IVC dilated  Diuresed with IV lasix and transitioned to lasix 80 mg po twice a day. Discharged to [facility]  SNF.   Today he returns for HF follow up with his son and caregiver. Overall feeling fine. Frustrated about follow up medical appointments.  His son reports that he has been getting stronger about sit up and do some walking. Still having issues with stamina. Denies SOB/PND/Orthopnea. Appetite ok. No fever or chills. Taking all medications. He has a personal care giver to visit at [place]. Plans d/c to [facility] in few days.    Labs (3/13): LDL 75, HDL 35, K 4.1, creatinine 1.1 Labs (3/14): LDL 91, HDL 27 Labs (10/14): K 4.3, creatinine 0.98 Labs (4/15): K 4.9, creatinine 1.1, LDL 76, HDL 30 Labs (5/15): K 4.3, creatinine 1.7, BNP 251 Labs (6/15): K 4.4, creatinine 1.3 Labs (10/15): TSH normal Labs (5/16): K 4.2, creatinine 1.12, HCT 39.5, LFTs normal, LDL 84, HDL 43, TSH normal Labs (11/16): creatinine 0.94 Labs (4/17): LDL 39, HDL 39 Labs (6/17): K 4.5, creatinine 1.2 Labs (9/17): K 4, creatinine 1.25, HCT 38.7 Labs (12/17): K 4.5, creatinine 1.49 => 1.62, BNP 43 Labs (2/18): K 3.8, creatinine 1.33, LFTs normal, TSH normal Labs (6/18): K 4.3, creatinine 1.49, LFTs normal, TSH normal, hgb 12.3 Labs (11/18): ESR 60, TSH normal, LFTs normal, creatinine 1.34 Labs (1/19): LDL 50 Labs (5/19): K 4.6, creatinine 1.55, LFTs normal, hgb 12.7, LFTs normal Labs (10/19): K 4 => 4.1, creatinine 1.39 => 1.43, BNP 279, hgb 10.7, TSH low, free T4 and free T3 low, ESR 108 Labs (3/21): K 4.5, creatinine 1.19, TSH normal Labs (4/21): K 4.8, creatinine 1.28, BNP 524, LDL 59 Labs (7/21): hgb 11.5, creatinine 1.4 Labs (8/21): K 4.3, creatinine 1.09 Labs (9/21): hgb 12.7 Labs (2/22): K 5, creatinine 1.54 Labs (5/22): K 4.2, creatinine 1.38, hgb 14.3, LDL 31 Labs (10/22): LDL 41, TGs 351, hgb 13.4, K 4.3, creatinine 1.57 Labs (1122): K 3.6, creatinine 1.32 Labs (1/23): K 4.0, creatinine 1.31, LDL 34 Labs (4/23): K 4.2, creatinine 1.66, LFTs normal, hgb 13.3, TSH normal Labs (5/23): K  3.2, creatinine 1.49 Labs (7/23): hgb 12 Labs (8/23): K 4.3, creatinine 1.76 Labs (9/23): hgb 12.5 Labs (10/23): K 4.5, creatinine 1.96 => 1.51 Labs (08/29/22): K 4.1 Creatinine 1/54   PMH: 1. HTN: Fatigue and cough with ramipril use.  2. COPD: Quit smoking 2014. PFTs (1/18) with moderately severe COPD.  - CT chest (11/18): Emphysema noted, no ILD.  3. AAA: CT 1/13 with 3.0 cm AAA.  Abdominal US (1/14) with 3.4 cm AAA. Abdominal US (4/15) with 3.25 x 3.27 AAA. Abdominal US (3/16) with 3.7 cm AAA.  Followed at [facility].  - Abd [name] (1/19): 3.3 cm AAA. 4. Squamous cell lung cancer diagnosed 2/12.  Had right lower lobectomy in 4/12.  5. Hyperlipidemia 6. PAD: Left fem-pop bypass 1994.  ABIs (2/12) 0.62 on right, 0.95 on left. ABIs (1/14): 0.65 on right, 1.04 on left. ABIs (4/15) 0.66 right, 0.87 left.  ABIs (3/16)  0.6 right, 0.88 left.  Patch angioplasty left fem-pop bypass in 11/16.   - Left fem-pop bypass patent on doppler evaluation in 12/17.  - ABIs (2/21): normal on left (1.02), moderately decreased on right (0.54).  - Peripheral arterial dopplers (3/23): >50% right iliac stenosis, ABIs 0.58 on right and 1.11 on left.  7. CAD: Stress myoview 2004 was normal. Lexiscan Cardiolite (3/14) with EF 66%, no ischemia or infarction. Lexiscan Cardiolite (7/17) with EF 61%, no ischemia/infarction.  - Cardiolite (12/17) with EF 62%, inferior/inferolateral fixed defect, most likely diaphragmatic attenuation, no ischemia.  - Cardiolite (9/18) with EF > 65%, fixed inferior defect (attenuation versus infarction), no ischemia.  - LHC (11/18): No obstructive CAD.  - Cardiolite (6/21): EF 70%, no ischemia/infarction 8. Chronic diastolic CHF: Echo (5/64): technically difficult with EF 55-60%, upper normal RV size.  Echo (5/16) with EF 60-65%.  Echo (7/17) with EF 55-60%, normal RV size and systolic function.  - Echo 12/17 with very poor windows, unable to comment on LV or RV function.  - Cardiac MRI (1/18) with EF  66%, normal RV size and systolic function.  - TEE (5/19): EF 55-60%, normal RV size and systolic function.  - Echo (4/21): EF 60-65%, normal RV - Echo (10/22): EF 55-60%, moderate LVH, mildly decreased RV systolic function - Echo (11/23): EF 50-55%, RV normal, IVC dilated  9. Carotid stenosis: TIA 10/14.  Carotid dopplers with > 33% RICA, 29-51% LICA.  Patient had right CEA in 10/14. Carotids (4/15) patent right CEA, LICA 88-41% stenosis.  Carotids (66/06) < 30% LICA, patent right CEA.  - Carotid dopplers (12/17): right CEA ok, < 16% LICA stenosis.  - Carotid dopplers (1/19): Right CEA ok, 0-10% LICA stenosis.  - Carotid dopplers (2/21): Right CEA ok, LICA 93-23% stenosis.  - Carotid dopplers (3/23): Right CEA ok, LICA 5-57% 10. Atrial fibrillation: Paroxysmal. First noted after lobectomy in 4/12 (brief), recurrence in 4/15 then in 5/15.  - TEE-guided DCCV for persistent atrial fibrillation in 5/19.  - Atrial fibrillation ablation 7/20.  - Re-do atrial fibrillation ablation 5/21.  - DCCV 10/23 11. Spinal stenosis 12. OSA: Using CPAP.  13. Glaucoma 14. Has ILR 15. Colon cancer: Sigmoid adenocarcinoma, s/p surgical resection.  16. 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  17. COVID-19 10/22 18. Choledocholithiasis 19. Upper GI bleeding in 5/23 from duodenal ulcer.   SH: Married, lives in [place].  Former [place].  Quit smoking in 2014.  Rare ETOH now.   FH: No premature CAD  ROS: All systems reviewed and negative except as per HPI.   Current Outpatient Medications  Medication Sig Dispense Refill   acetaminophen (TYLENOL) 325 MG tablet Take 650 mg by mouth every 6 (six) hours as needed for moderate pain or headache.     albuterol (VENTOLIN HFA) 108 (90 Base) MCG/ACT inhaler Inhale 2 puffs into the lungs every 6 (six) hours as needed for wheezing or shortness of breath. 8.5 g 0   amLODipine (NORVASC) 10 MG tablet Take 1 tablet (10 mg total) by mouth  daily. 90 tablet 3   apixaban (ELIQUIS) 2.5 MG TABS tablet Take 1 tablet (2.5 mg total) by mouth 2 (two) times daily. 60 tablet 0   Brinzolamide-Brimonidine (SIMBRINZA) 1-0.2 % SUSP Place 1 drop into both eyes 3 (three) times daily.     Budeson-Glycopyrrol-Formoterol (BREZTRI AEROSPHERE) 160-9-4.8 MCG/ACT AERO Inhale 2 puffs into the lungs in the morning and at bedtime. (Patient taking differently: Inhale 2 puffs into the lungs  daily.) 10.7 g 0   docusate sodium (COLACE) 100 MG capsule Take 100 mg by mouth 2 (two) times daily as needed (constipation).     escitalopram (LEXAPRO) 10 MG tablet TAKE ONE TABLET BY MOUTH EVERY DAY (Patient taking differently: Take 10 mg by mouth daily.) 28 tablet 5   furosemide (LASIX) 20 MG tablet Take 4 tablets (80 mg total) by mouth 2 (two) times daily. 450 tablet 3   JARDIANCE 10 MG TABS tablet TAKE ONE TABLET BY MOUTH EVERY DAY BEFORE BREAKFAST. (Patient taking differently: Take 10 mg by mouth daily.) 30 tablet 11   latanoprost (XALATAN) 0.005 % ophthalmic solution Place 1 drop into both eyes at bedtime.     Melatonin 10 MG TABS Take 20 mg by mouth at bedtime.     metoprolol succinate (TOPROL-XL) 25 MG 24 hr tablet Take 1 tablet (25 mg total) by mouth daily. (Patient taking differently: Take 25 mg by mouth at bedtime.) 90 tablet 3   montelukast (SINGULAIR) 10 MG tablet TAKE ONE TABLET BY MOUTH AT BEDTIME (Patient taking differently: Take 10 mg by mouth at bedtime.) 28 tablet 5   omeprazole (PRILOSEC) 20 MG capsule Take 1 capsule (20 mg total) by mouth 2 (two) times daily. 60 capsule 3   OVER THE COUNTER MEDICATION Take 3 capsules by mouth daily. Balance of Nature Fruits     OVER THE COUNTER MEDICATION Take 3 capsules by mouth daily. Balance of Nature Veggies     Polyethyl Glycol-Propyl Glycol (SYSTANE ULTRA) 0.4-0.3 % SOLN Place 1 drop into both eyes daily as needed (dry eyes).     potassium chloride SA (KLOR-CON M) 20 MEQ tablet TAKE TWO TABLETS BY MOUTH IN THE  MORNING AND TAKE ONE TABLET IN THE EVENING (Patient taking differently: Take 20-40 mEq by mouth See admin instructions. 40 mEq in the morning, 20 mEq in the evening) 90 tablet 6   Probiotic Product (DIGESTIVE ADVANTAGE) CAPS Take 1 capsule by mouth daily.     rosuvastatin (CRESTOR) 40 MG tablet TAKE ONE TABLET BY MOUTH EVERY DAY (Patient taking differently: Take 40 mg by mouth daily.) 90 tablet 3   spironolactone (ALDACTONE) 25 MG tablet Take 1 tablet (25 mg total) by mouth daily. 30 tablet 0   No current facility-administered medications for this encounter.   Wt Readings from Last 3 Encounters:  09/26/22 80.3 kg (177 lb)  08/29/22 79.8 kg (176 lb)  08/14/22 86.6 kg (191 lb)   BP (!) 146/80   Pulse 78   Wt 80.3 kg (177 lb)   SpO2 91%   BMI 23.35 kg/m      General: arrived in a wheelchair.  No resp difficulty HEENT: normal Neck: supple. no JVD. Carotids 2+ bilat; no bruits. No lymphadenopathy or thryomegaly appreciated. Cor: PMI nondisplaced. Regular rate & rhythm. No rubs, gallops or murmurs. Lungs: clear Abdomen: soft, nontender, nondistended. No hepatosplenomegaly. No bruits or masses. Good bowel sounds. Extremities: no cyanosis, clubbing, rash, edema Neuro: alert & orientedx3, cranial nerves grossly intact. moves all 4 extremities w/o difficulty. Affect pleasant  Assessment/Plan: 1. Chronic diastolic CHF: Echo in 09/23 with EF 55-60%, mild LVH, mildly dysfunctional RV. Echo repeated during recent admit 08/2022 EF 50-55%, RV normal, IVC dilated  NYHA II. Volume status stable. Continue Lasix 80 mg bid - Continue Jardiance 10 mg daily. No GU symptoms. 2. Hyperlipidemia: 1/23 lipids with good LDL. - Continue Crestor 40 mg daily.  - Continue Vascepa 2 g bid. 3. Carotid stenosis: s/p R CEA.  Dopplers followed at [facility].  4. PAD: s/p patch angioplasty to left fem-pop bypass in 11/16.  Followed at [facility].  Peripheral arterial dopplers in 3/23 with >50% right iliac stenosis, but no rest pain  or pedal ulcerations.  Rare mild claudication.  - Follows with [facility].  5. AAA: Stable on last [name], followed at [facility].   6. COPD: No longer smoking. PFTs in 1/18 showed moderately severe obstructive defect. CT chest in 11/18 showed emphysema.  COPD contributes to his dyspnea. 7. Atrial fibrillation: S/p atrial fibrillation ablation in 7/20 and redo in 5/21.  He has been primarily in NSR since redo ablation.  Back in slow atrial fibrillation 9/23 and s/p DCCV 10/23 to NSR. He saw EP recently, poor candidate for redo ablation.  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 GFR would limit Tikosyn to the lowest dose, probably not a good choice for him.  - Rate controlled. No bleeding issues.  - Continue Toprol XL 25 mg daily.  - Continue Eliquis 2.5 mg bid. (Reduced dose with creatinine and age). - Continue to limit ETOH.  - Continue to use CPAP.  8. CKD: Stage 3. Check BMET today  9. CAD: LHC in 11/18 with nonobstructive disease only.  Cardiolite in 6/21 with no ischemia/infarction.  - No chest pain. 10. 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  He is no longer on methimazole, follows with endocrinology.     11. Colon cancer: Sigmoid adenocarcinoma, s/p surgical resection. In remission.  12. HTN: Continue current regimen.  13. Pleural effusion: Chronic right pleural effusion, s/p right lower lobectomy.   14. Squamous cell lung cancer: S/p right lower lobectomy.  Follows with Dr. Valeta Harms.  15. Upper GI bleeding: In 5/23 due to duodenal ulcer.  - Continue PPI.    Follow up  with Dr Aundra Dubin in 3 months.   Jesyca Weisenburger NP-C  09/26/2022

## 2022-09-26 NOTE — Telephone Encounter (Signed)
[employer] LINQ 11 CLE:  Established HF Device reached RRT 12/21 - route to triage LA

## 2022-09-26 NOTE — Patient Instructions (Signed)
Thank you for coming in today  Labs were done today, if any labs are abnormal the clinic will call you No news is good news  Your physician recommends that you schedule a follow-up appointment in:  3 months with Dr. Mclean    Do the following things EVERYDAY: Weigh yourself in the morning before breakfast. Write it down and keep it in a log. Take your medicines as prescribed Eat low salt foods--Limit salt (sodium) to 2000 mg per day.  Stay as active as you can everyday Limit all fluids for the day to less than 2 liters  At [facility], you and your health needs are our priority. As part of our continuing mission to provide you with exceptional heart care, we have created designated Provider Care Teams. These Care Teams include your primary Cardiologist (physician) and Advanced Practice Providers (APPs- Physician Assistants and Nurse Practitioners) who all work together to provide you with the care you need, when you need it.   You may see any of the following providers on your designated Care Team at your next follow up: Dr Daniel Bensimhon Dr Dalton McLean Dr. Aditya Sabharwal Amy Clegg, NP Brittainy Simmons, PA Jessica Milford,NP Lindsay Finch, PA Alma Diaz, NP Lauren Kemp, PharmD   Please be sure to bring in all your medications bottles to every appointment.   If you have any questions or concerns before your next appointment please send us a message through mychart or call our office at [phone].    TO LEAVE A MESSAGE FOR THE NURSE SELECT OPTION 2, PLEASE LEAVE A MESSAGE INCLUDING: YOUR NAME DATE OF BIRTH CALL BACK NUMBER REASON FOR CALL**this is important as we prioritize the call backs  YOU WILL RECEIVE A CALL BACK THE SAME DAY AS LONG AS YOU CALL BEFORE 4:00 PM  

## 2022-09-27 DIAGNOSIS — H401133 Primary open-angle glaucoma, bilateral, severe stage: Secondary | ICD-10-CM | POA: Diagnosis not present

## 2022-09-27 NOTE — Progress Notes (Signed)
Carelink Summary Report / Loop Recorder 

## 2022-09-28 DIAGNOSIS — J449 Chronic obstructive pulmonary disease, unspecified: Secondary | ICD-10-CM | POA: Diagnosis not present

## 2022-09-28 DIAGNOSIS — R531 Weakness: Secondary | ICD-10-CM | POA: Diagnosis not present

## 2022-09-28 DIAGNOSIS — R2681 Unsteadiness on feet: Secondary | ICD-10-CM | POA: Diagnosis not present

## 2022-09-28 DIAGNOSIS — K219 Gastro-esophageal reflux disease without esophagitis: Secondary | ICD-10-CM | POA: Diagnosis not present

## 2022-09-28 DIAGNOSIS — I509 Heart failure, unspecified: Secondary | ICD-10-CM | POA: Diagnosis not present

## 2022-09-28 NOTE — Telephone Encounter (Signed)
Spoke with patient regarding if he wanted to have loop recorder taken out or left in, patient does not know at this point and he is moving patient will call back when he makes a decision as to what he wants to do as far as leaving loop in or having it taken out.  Will continue to monitor for now.

## 2022-10-05 ENCOUNTER — Ambulatory Visit (INDEPENDENT_AMBULATORY_CARE_PROVIDER_SITE_OTHER): Payer: PPO

## 2022-10-05 VITALS — Ht 73.5 in | Wt 176.0 lb

## 2022-10-05 DIAGNOSIS — Z0000 Encounter for general adult medical examination without abnormal findings: Secondary | ICD-10-CM

## 2022-10-05 NOTE — Patient Instructions (Signed)
Troy [name] , Thank you for taking time to come for your Medicare Wellness Visit. I appreciate your ongoing commitment to your health goals. Please review the following plan we discussed and let me know if I can assist you in the future.   These are the goals we discussed:  Goals      Client understands the importance of follow-up with providers by attending scheduled visits        This is a list of the screening recommended for you and due dates:  Health Maintenance  Topic Date Due   Complete foot exam   Never done   COVID-19 Vaccine (5 - 2023-24 season) 06/01/2022   Zoster (Shingles) Vaccine (1 of 2) 10/11/2022*   Hemoglobin A1C  01/10/2023   Yearly kidney health urinalysis for diabetes  01/11/2023   Eye exam for diabetics  05/08/2023   Colon Cancer Screening  06/24/2023   Yearly kidney function blood test for diabetes  09/27/2023   Medicare Annual Wellness Visit  10/06/2023   DTaP/Tdap/Td vaccine (3 - Td or Tdap) 02/13/2032   Pneumonia Vaccine  Completed   Flu Shot  Completed   HPV Vaccine  Aged Out  *Topic was postponed. The date shown is not the original due date.    Advanced directives: No  Conditions/risks identified: Yes; Type II Diabetes  Next appointment: Follow up in one year for your annual wellness visit.   Preventive Care 85 Years and Older, Male  Preventive care refers to lifestyle choices and visits with your health care provider that can promote health and wellness. What does preventive care include? A yearly physical exam. This is also called an annual well check. Dental exams once or twice a year. Routine eye exams. Ask your health care provider how often you should have your eyes checked. Personal lifestyle choices, including: Daily care of your teeth and gums. Regular physical activity. Eating a healthy diet. Avoiding tobacco and drug use. Limiting alcohol use. Practicing safe sex. Taking low doses of aspirin every day. Taking vitamin and mineral  supplements as recommended by your health care provider. What happens during an annual well check? The services and screenings done by your health care provider during your annual well check will depend on your age, overall health, lifestyle risk factors, and family history of disease. Counseling  Your health care provider may ask you questions about your: Alcohol use. Tobacco use. Drug use. Emotional well-being. Home and relationship well-being. Sexual activity. Eating habits. History of falls. Memory and ability to understand (cognition). Work and work Statistician. Screening  You may have the following tests or measurements: Height, weight, and BMI. Blood pressure. Lipid and cholesterol levels. These may be checked every 5 years, or more frequently if you are over 27 years old. Skin check. Lung cancer screening. You may have this screening every year starting at age 32 if you have a 30-pack-year history of smoking and currently smoke or have quit within the past 15 years. Fecal occult blood test (FOBT) of the stool. You may have this test every year starting at age 48. Flexible sigmoidoscopy or colonoscopy. You may have a sigmoidoscopy every 5 years or a colonoscopy every 10 years starting at age 8. Prostate cancer screening. Recommendations will vary depending on your family history and other risks. Hepatitis C blood test. Hepatitis B blood test. Sexually transmitted disease (STD) testing. Diabetes screening. This is done by checking your blood sugar (glucose) after you have not eaten for a while (fasting). You may  have this done every 1-3 years. Abdominal aortic aneurysm (AAA) screening. You may need this if you are a current or former smoker. Osteoporosis. You may be screened starting at age 54 if you are at high risk. Talk with your health care provider about your test results, treatment options, and if necessary, the need for more tests. Vaccines  Your health care provider  may recommend certain vaccines, such as: Influenza vaccine. This is recommended every year. Tetanus, diphtheria, and acellular pertussis (Tdap, Td) vaccine. You may need a Td booster every 10 years. Zoster vaccine. You may need this after age 62. Pneumococcal 13-valent conjugate (PCV13) vaccine. One dose is recommended after age 37. Pneumococcal polysaccharide (PPSV23) vaccine. One dose is recommended after age 41. Talk to your health care provider about which screenings and vaccines you need and how often you need them. This information is not intended to replace advice given to you by your health care provider. Make sure you discuss any questions you have with your health care provider. Document Released: 10/14/2015 Document Revised: 06/06/2016 Document Reviewed: 07/19/2015 Elsevier Interactive Patient Education  2017 [place] Prevention in the Home Falls can cause injuries. They can happen to people of all ages. There are many things you can do to make your home safe and to help prevent falls. What can I do on the outside of my home? Regularly fix the edges of walkways and driveways and fix any cracks. Remove anything that might make you trip as you walk through a door, such as a raised step or threshold. Trim any bushes or trees on the path to your home. Use bright outdoor lighting. Clear any walking paths of anything that might make someone trip, such as rocks or tools. Regularly check to see if handrails are loose or broken. Make sure that both sides of any steps have handrails. Any raised decks and porches should have guardrails on the edges. Have any leaves, snow, or ice cleared regularly. Use sand or salt on walking paths during winter. Clean up any spills in your garage right away. This includes oil or grease spills. What can I do in the bathroom? Use night lights. Install grab bars by the toilet and in the tub and shower. Do not use towel bars as grab bars. Use  non-skid mats or decals in the tub or shower. If you need to sit down in the shower, use a plastic, non-slip stool. Keep the floor dry. Clean up any water that spills on the floor as soon as it happens. Remove soap buildup in the tub or shower regularly. Attach bath mats securely with double-sided non-slip rug tape. Do not have throw rugs and other things on the floor that can make you trip. What can I do in the bedroom? Use night lights. Make sure that you have a light by your bed that is easy to reach. Do not use any sheets or blankets that are too big for your bed. They should not hang down onto the floor. Have a firm chair that has side arms. You can use this for support while you get dressed. Do not have throw rugs and other things on the floor that can make you trip. What can I do in the kitchen? Clean up any spills right away. Avoid walking on wet floors. Keep items that you use a lot in easy-to-reach places. If you need to reach something above you, use a strong step stool that has a grab bar. Keep electrical cords  out of the way. Do not use floor polish or wax that makes floors slippery. If you must use wax, use non-skid floor wax. Do not have throw rugs and other things on the floor that can make you trip. What can I do with my stairs? Do not leave any items on the stairs. Make sure that there are handrails on both sides of the stairs and use them. Fix handrails that are broken or loose. Make sure that handrails are as long as the stairways. Check any carpeting to make sure that it is firmly attached to the stairs. Fix any carpet that is loose or worn. Avoid having throw rugs at the top or bottom of the stairs. If you do have throw rugs, attach them to the floor with carpet tape. Make sure that you have a light switch at the top of the stairs and the bottom of the stairs. If you do not have them, ask someone to add them for you. What else can I do to help prevent falls? Wear  shoes that: Do not have high heels. Have rubber bottoms. Are comfortable and fit you well. Are closed at the toe. Do not wear sandals. If you use a stepladder: Make sure that it is fully opened. Do not climb a closed stepladder. Make sure that both sides of the stepladder are locked into place. Ask someone to hold it for you, if possible. Clearly mark and make sure that you can see: Any grab bars or handrails. First and last steps. Where the edge of each step is. Use tools that help you move around (mobility aids) if they are needed. These include: Canes. Walkers. Scooters. Crutches. Turn on the lights when you go into a dark area. Replace any light bulbs as soon as they burn out. Set up your furniture so you have a clear path. Avoid moving your furniture around. If any of your floors are uneven, fix them. If there are any pets around you, be aware of where they are. Review your medicines with your doctor. Some medicines can make you feel dizzy. This can increase your chance of falling. Ask your doctor what other things that you can do to help prevent falls. This information is not intended to replace advice given to you by your health care provider. Make sure you discuss any questions you have with your health care provider. Document Released: 07/14/2009 Document Revised: 02/23/2016 Document Reviewed: 10/22/2014 Elsevier Interactive Patient Education  2017 [employer].

## 2022-10-05 NOTE — Progress Notes (Signed)
Virtual Visit via Telephone Note  I connected with  Troy Debord [name] on 10/05/22 at  2:00 PM EST by telephone and verified that I am speaking with the correct person using two identifiers.  Location: Patient: Home/[facility] Provider: [employer] Persons participating in the virtual visit: patient/Nurse Health Advisor   I discussed the limitations, risks, security and privacy concerns of performing an evaluation and management service by telephone and the availability of in person appointments. The patient expressed understanding and agreed to proceed.  Interactive audio and video telecommunications were attempted between this nurse and patient, however failed, due to patient having technical difficulties OR patient did not have access to video capability.  We continued and completed visit with audio only.  Some vital signs may be absent or patient reported.   Sheral Flow, LPN  Subjective:   Troy Adamczak [name] is a 82 y.o. male who presents for Medicare Annual/Subsequent preventive examination.  Review of Systems     Cardiac Risk Factors include: advanced age (>38men, >62 women);dyslipidemia;family history of premature cardiovascular disease;hypertension;male gender;sedentary lifestyle     Objective:    Today's Vitals   10/05/22 1404  Weight: 176 lb (79.8 kg)  Height: 6' 1.5" (1.867 m)  PainSc: 0-No pain   Body mass index is 22.91 kg/m.     10/05/2022    2:05 PM 08/24/2022    1:47 AM 07/10/2022    9:06 AM 05/16/2022   12:29 PM 02/19/2022    9:49 AM 02/13/2022   10:05 AM 02/12/2022   11:00 PM  Advanced Directives  Does Patient Have a Medical Advance Directive? Yes Yes Yes No Yes Yes Yes  Type of Paramedic of [place];Living will [place];Living will [place];Living will Living will;Healthcare Power of Attorney Living will;Out of facility DNR (pink MOST or yellow form)   Living will  Does patient want to make changes to medical advance directive?    No - Patient declined No - Patient declined  No - Patient declined  Copy of [place] in Chart? No - copy requested  Yes - validated most recent copy scanned in chart (See row information)      Would patient like information on creating a medical advance directive?    No - Patient declined   No - Patient declined    Current Medications (verified) Outpatient Encounter Medications as of 10/05/2022  Medication Sig   acetaminophen (TYLENOL) 325 MG tablet Take 650 mg by mouth every 6 (six) hours as needed for moderate pain or headache.   albuterol (VENTOLIN HFA) 108 (90 Base) MCG/ACT inhaler Inhale 2 puffs into the lungs every 6 (six) hours as needed for wheezing or shortness of breath.   amLODipine (NORVASC) 10 MG tablet Take 1 tablet (10 mg total) by mouth daily.   apixaban (ELIQUIS) 2.5 MG TABS tablet Take 1 tablet (2.5 mg total) by mouth 2 (two) times daily.   Brinzolamide-Brimonidine (SIMBRINZA) 1-0.2 % SUSP Place 1 drop into both eyes 3 (three) times daily.   Budeson-Glycopyrrol-Formoterol (BREZTRI AEROSPHERE) 160-9-4.8 MCG/ACT AERO Inhale 2 puffs into the lungs in the morning and at bedtime. (Patient taking differently: Inhale 2 puffs into the lungs daily.)   docusate sodium (COLACE) 100 MG capsule Take 100 mg by mouth 2 (two) times daily as needed (constipation).   escitalopram (LEXAPRO) 10 MG tablet TAKE ONE TABLET BY MOUTH EVERY DAY (Patient taking differently: Take 10 mg by mouth daily.)  furosemide (LASIX) 20 MG tablet Take 4 tablets (80 mg total) by mouth 2 (two) times daily.   JARDIANCE 10 MG TABS tablet TAKE ONE TABLET BY MOUTH EVERY DAY BEFORE BREAKFAST. (Patient taking differently: Take 10 mg by mouth daily.)   latanoprost (XALATAN) 0.005 % ophthalmic solution Place 1 drop into both eyes at bedtime.   Melatonin 10 MG TABS Take 20 mg by mouth at bedtime.   metoprolol succinate (TOPROL-XL)  25 MG 24 hr tablet Take 1 tablet (25 mg total) by mouth daily. (Patient taking differently: Take 25 mg by mouth at bedtime.)   montelukast (SINGULAIR) 10 MG tablet TAKE ONE TABLET BY MOUTH AT BEDTIME (Patient taking differently: Take 10 mg by mouth at bedtime.)   omeprazole (PRILOSEC) 20 MG capsule Take 1 capsule (20 mg total) by mouth 2 (two) times daily.   OVER THE COUNTER MEDICATION Take 3 capsules by mouth daily. Balance of Nature Fruits   OVER THE COUNTER MEDICATION Take 3 capsules by mouth daily. Balance of Nature Veggies   Polyethyl Glycol-Propyl Glycol (SYSTANE ULTRA) 0.4-0.3 % SOLN Place 1 drop into both eyes daily as needed (dry eyes).   potassium chloride SA (KLOR-CON M) 20 MEQ tablet TAKE TWO TABLETS BY MOUTH IN THE MORNING AND TAKE ONE TABLET IN THE EVENING (Patient taking differently: Take 20-40 mEq by mouth See admin instructions. 40 mEq in the morning, 20 mEq in the evening)   Probiotic Product (DIGESTIVE ADVANTAGE) CAPS Take 1 capsule by mouth daily.   rosuvastatin (CRESTOR) 40 MG tablet TAKE ONE TABLET BY MOUTH EVERY DAY (Patient taking differently: Take 40 mg by mouth daily.)   spironolactone (ALDACTONE) 25 MG tablet Take 1 tablet (25 mg total) by mouth daily.   No facility-administered encounter medications on file as of 10/05/2022.    Allergies (verified) Niacin-lovastatin er, Penicillins, Sulfonamide derivatives, and Lipitor [atorvastatin]   History: Past Medical History:  Diagnosis Date   AAA (abdominal aortic aneurysm) ([place]) 10-20-17   MONITORED BY DR [name], 3.7 CM by ABDOMINAL US 11/2021   Anxiety    Arthritis    Atrial fibrillation (HCC)    Basal cell carcinoma    BPH (benign prostatic hypertrophy)    Chronic diastolic heart failure (HCC)    Chronic kidney disease    stage 3, pt unaware   Colon cancer (HCC)    COPD (chronic obstructive pulmonary disease) (HCC)    Depression    GERD (gastroesophageal reflux disease)    Glaucoma BOTH EYES   Dr Gershon Crane    History of lung cancer APRIL 2012   ONCOLOGIST- DR Tressie Stalker  [facility] 10-27-2012 SQUAMOUS CELL---- S/P RIGHT LOWER LOBECTOMY AT [facility] -- NO CHEMORADIATION--- NO RECURRENCE   Hx of adenomatous colonic polyps 2005    X 2; 1 hyperplastic polyp; Dr Olevia Perches   Hyperlipidemia    Hypertension    OSA (obstructive sleep apnea) 08/29/2015   CPAP SET ON 10   Peripheral vascular disease ([place])    FOLLOWED  BY DR DICKSON   Spinal stenosis 06/2014   Thoracic aorta atherosclerosis (HCC)    Thyrotoxicosis    amiodarone induced   Past Surgical History:  Procedure Laterality Date   ANGIO PLASTY     X 4 in legs   AORTOGRAM  07-27-2002   MILD DIFFUSE ILIAC ARTERY OCCLUSIVE DISEASE /  LEFT RENAL ARTERY 20%/ PATENT LEFT FEM-POP GRAFT/ MILD SFA AND POPLITEAL ARTERY OCCLUSIVE DISEASE W/ SEVERE KIDNEY OCCLUSIVE DISEASE   ATRIAL FIBRILLATION ABLATION N/A 04/09/2019  Procedure: ATRIAL FIBRILLATION ABLATION;  Surgeon: Thompson Grayer, MD;  Location: [place] [facility];  Service: Cardiovascular;  Laterality: N/A;   ATRIAL FIBRILLATION ABLATION N/A 02/12/2020   Procedure: ATRIAL FIBRILLATION ABLATION;  Surgeon: Thompson Grayer, MD;  Location: [place] [facility];  Service: Cardiovascular;  Laterality: N/A;   BALLOON DILATION N/A 02/06/2022   Procedure: BALLOON Tamponade ampulla;  Surgeon: Irving Copas., MD;  Location[name];  Service: Gastroenterology;  Laterality: N/A;   BASAL CELL CARCINOMA EXCISION     MULTIPLE TIMES--  RIGHT FOREARM, CHEEKS, AND BACK   BIOPSY  06/25/2018   Procedure: BIOPSY;  Surgeon: Rush Landmark Telford Nab., MD;  Location: [place];  Service: Gastroenterology;;   BIOPSY  02/06/2022   Procedure: BIOPSY;  Surgeon: Irving Copas., MD;  Location: [facility];  Service: Gastroenterology;;   CARDIOVASCULAR STRESS TEST  12-08-2012  DR [facility]   NORMAL LEXISCAN WITH NO EXERCISE NUCLEAR STUDY/ EF 66%/   NO ISCHEMIA/ NO SIGNIFICANT CHANGE FROM PRIOR STUDY   CARDIOVERSION N/A  02/14/2018   Procedure: CARDIOVERSION;  Surgeon: Larey Dresser, MD;  Location: [facility] ENDOSCOPY;  Service: Cardiovascular;  Laterality: N/A;   CARDIOVERSION N/A 07/10/2022   Procedure: CARDIOVERSION;  Surgeon: Larey Dresser, MD;  Location: [place];  Service: Cardiovascular;  Laterality: N/A;   CAROTID ANGIOGRAM N/A 07/10/2013   Procedure: CAROTID ANGIOGRAM;  Surgeon: Elam Dutch, MD;  Location: [facility] CATH LAB;  Service: Cardiovascular;  Laterality: N/A;   CAROTID ENDARTERECTOMY Right 07-14-13   cea   CATARACT EXTRACTION W/ INTRAOCULAR LENS  IMPLANT, BILATERAL     colon polyectomy     COLONOSCOPY     COLONOSCOPY WITH PROPOFOL N/A 06/25/2018   Procedure: COLONOSCOPY WITH PROPOFOL;  Surgeon: Rush Landmark Telford Nab., MD;  Location: [place];  Service: Gastroenterology;  Laterality: N/A;   CYSTOSCOPY W/ RETROGRADES Bilateral 01/21/2013   Procedure: CYSTOSCOPY WITH RETROGRADE PYELOGRAM;  Surgeon: Molli Hazard, MD;  Location: [facility];  Service: Urology;  Laterality: Bilateral;   CYSTO, BLADDER BIOPSY, BILATERAL RETROGRADE PYELOGRAM  RAD TECH FROM RADIOLOGY PER JOY   CYSTOSCOPY WITH BIOPSY N/A 01/21/2013   Procedure: CYSTOSCOPY WITH BIOPSY;  Surgeon: Molli Hazard, MD;  Location: [facility];  Service: Urology;  Laterality: N/A;   ENDARTERECTOMY Right 07/14/2013   Procedure: ENDARTERECTOMY CAROTID;  Surgeon: Angelia Mould, MD;  Location: [facility] OR;  Service: Vascular;  Laterality: Right;   ENDOSCOPIC RETROGRADE CHOLANGIOPANCREATOGRAPHY (ERCP) WITH PROPOFOL N/A 02/06/2022   Procedure: ENDOSCOPIC RETROGRADE CHOLANGIOPANCREATOGRAPHY (ERCP) WITH PROPOFOL;  Surgeon: Irving Copas., MD;  Location: [name];  Service: Gastroenterology;  Laterality: N/A;   EP IMPLANTABLE DEVICE N/A 08/12/2015   Procedure: Loop Recorder Insertion;  Surgeon: Thompson Grayer, MD;  Location: [place] [facility];  Service: Cardiovascular;  Laterality: N/A;    ESOPHAGOGASTRODUODENOSCOPY Left 02/13/2022   Procedure: ESOPHAGOGASTRODUODENOSCOPY (EGD);  Surgeon: Lavena Bullion, DO;  Location: [facility] ENDOSCOPY;  Service: Gastroenterology;  Laterality: Left;   ESOPHAGOGASTRODUODENOSCOPY (EGD) WITH PROPOFOL N/A 06/25/2018   Procedure: ESOPHAGOGASTRODUODENOSCOPY (EGD) WITH PROPOFOL;  Surgeon: Rush Landmark Telford Nab., MD;  Location: [place];  Service: Gastroenterology;  Laterality: N/A;   ESOPHAGOGASTRODUODENOSCOPY (EGD) WITH PROPOFOL N/A 07/10/2018   Procedure: ESOPHAGOGASTRODUODENOSCOPY (EGD) WITH PROPOFOL;  Surgeon: Milus Banister, MD;  [facility];  Service: Endoscopy;  Laterality: N/A;   EUS N/A 07/10/2018   Procedure: UPPER ENDOSCOPIC ULTRASOUND (EUS) RADIAL;  Surgeon: Milus Banister, MD;  [facility];  Service: Endoscopy;  Laterality: N/A;  EYE SURGERY Right    FEMORAL-POPLITEAL BYPASS GRAFT Left 1994  [facility]   AND 2001  IN [place]   FEMORAL-POPLITEAL BYPASS GRAFT Left 08/30/2015   Procedure: REVISION OF BYPASS GRAFT Left  FEMORAL-POPLITEAL ARTERY;  Surgeon: Angelia Mould, MD;  Location: [place];  Service: Vascular;  Laterality: Left;   FINE NEEDLE ASPIRATION N/A 07/10/2018   Procedure: FINE NEEDLE ASPIRATION (FNA) LINEAR;  Surgeon: Milus Banister, MD;  [facility];  Service: Endoscopy;  Laterality: N/A;   FLEXIBLE SIGMOIDOSCOPY N/A 12/12/2018   Procedure: FLEXIBLE SIGMOIDOSCOPY;  Surgeon: Ileana Roup, MD;  [facility];  Service: General;  Laterality: N/A;   HEMOSTASIS CLIP PLACEMENT  02/13/2022   Procedure: HEMOSTASIS CLIP PLACEMENT;  Surgeon: Lavena Bullion, DO;  Location: [place];  Service: Gastroenterology;;   HOT HEMOSTASIS N/A 02/13/2022   Procedure: HOT HEMOSTASIS (ARGON PLASMA COAGULATION/BICAP);  Surgeon: Lavena Bullion, DO;  Location: [facility] ENDOSCOPY;  Service: Gastroenterology;  Laterality: N/A;   LARYNGOSCOPY  06-27-2004   BX VOCAL CORD  (LEUKOPLAKIA)  PER PT NO  ISSUES SINCE   LEFT HEART CATH AND CORONARY ANGIOGRAPHY N/A 08/20/2017   Procedure: LEFT HEART CATH AND CORONARY ANGIOGRAPHY;  Surgeon: Larey Dresser, MD;  Location: [place] [facility];  Service: Cardiovascular;  Laterality: N/A;   LOOP RECORDER INSERTION N/A 04/09/2019   Procedure: LOOP RECORDER INSERTION;  Surgeon: Thompson Grayer, MD;  Location: [place] [facility];  Service: Cardiovascular;  Laterality: N/A;   LOOP RECORDER REMOVAL N/A 04/09/2019   Procedure: LOOP RECORDER REMOVAL;  Surgeon: Thompson Grayer, MD;  Location: [place] [facility];  Service: Cardiovascular;  Laterality: N/A;   LOWER EXTREMITY ANGIOGRAM Bilateral 08/29/2015   Procedure: Lower Extremity Angiogram;  Surgeon: Angelia Mould, MD;  Location: [place] [facility];  Service: Cardiovascular;  Laterality: Bilateral;   LUNG LOBECTOMY  01/24/2011    RIGHT UPPER LOBE  (SQUAMOUS CELL CARCINOMA) Dr Dorthea Cove , [facility]. No chemotherapyor radiation   PANCREATIC STENT PLACEMENT  02/06/2022   Procedure: PANCREATIC STENT PLACEMENT;  Surgeon: Rush Landmark Telford Nab., MD;  Location[name];  Service: Gastroenterology;;   [facility] ANGIOPLASTY Right 07/14/2013   Procedure: PATCH ANGIOPLASTY;  Surgeon: Angelia Mould, MD;  Location: [place];  Service: Vascular;  Laterality: Right;   PATCH ANGIOPLASTY Left 08/30/2015   Procedure: VEIN PATCH ANGIOPLASTY OF PROXIMAL Left BYPASS GRAFT;  Surgeon: Angelia Mould, MD;  Location: [place];  Service: Vascular;  Laterality: Left;   PERIPHERAL VASCULAR CATHETERIZATION N/A 08/29/2015   Procedure: Abdominal Aortogram;  Surgeon: Angelia Mould, MD;  Location: [place] [facility];  Service: Cardiovascular;  Laterality: N/A;   POLYPECTOMY  06/25/2018   Procedure: POLYPECTOMY;  Surgeon: Rush Landmark Telford Nab., MD;  Location: [place];  Service: Gastroenterology;;   POLYPECTOMY     POLYPECTOMY  02/13/2022   Procedure: POLYPECTOMY;  Surgeon: Lavena Bullion, DO;  Location: [place] ENDOSCOPY;   Service: Gastroenterology;;   REMOVAL OF STONES  02/06/2022   Procedure: REMOVAL OF STONES;  Surgeon: Irving Copas., MD;  Location[name];  Service: Gastroenterology;;   Clide Deutscher  02/13/2022   Procedure: Clide Deutscher;  Surgeon: Lavena Bullion, DO;  Location: [place];  Service: Gastroenterology;;   Joan Mayans  02/06/2022   Procedure: Joan Mayans;  Surgeon: Irving Copas., MD;  Location: [name];  Service: Gastroenterology;;   SUBMUCOSAL INJECTION  06/25/2018   Procedure: SUBMUCOSAL INJECTION;  Surgeon: Irving Copas., MD;  Location: [place];  Service: Gastroenterology;;  TEE WITHOUT CARDIOVERSION N/A 02/14/2018   Procedure: TRANSESOPHAGEAL ECHOCARDIOGRAM (TEE);  Surgeon: Larey Dresser, MD;  Location: [facility] ENDOSCOPY;  Service: Cardiovascular;  Laterality: N/A;   trabecular surgery     OS   TRANSTHORACIC ECHOCARDIOGRAM  12-29-2012  DR [facility]   MILD LVH/  LVSF NORMAL/ EF 68-03%/  GRADE I DIASTOLIC DYSFUNCTION   Family History  Problem Relation Age of Onset   Stroke Mother        mini strokes   Alcohol abuse Father    Heart disease Father        MI after 64   Stroke Father    Hypertension Father    Heart attack Father    Heart disease Paternal Aunt        several   Hypertension Paternal Aunt        several   Stroke Paternal Aunt        several   Stroke Paternal Uncle        several   Heart disease Paternal Uncle        several;2 had MI pre 41   Cancer Daughter 105       breast ca, also with benign sessile polyp    Colon cancer Daughter 66   Colon polyps Neg Hx    Esophageal cancer Neg Hx    Rectal cancer Neg Hx    Stomach cancer Neg Hx    Social History   Socioeconomic History   Marital status: Married    Spouse name: [name]    Number of children: 3   Years of education: 12+   Highest education level: Not on file  Occupational History   Occupation: Retired    Comment: Owns a [employer], Retail banker, 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  Tobacco Use   Smoking status: Former    Packs/day: 0.50    Years: 50.00    Total pack years: 25.00    Types: Cigarettes    Quit date: 07/28/2014    Years since quitting: 8.1   Smokeless tobacco: Never  Vaping Use   Vaping Use: Never used  Substance and Sexual Activity   Alcohol use: Yes    Alcohol/week: 4.0 standard drinks of alcohol    Types: 1 Glasses of wine, 1 Cans of beer, 1 Shots of liquor, 1 Standard drinks or equivalent per week    Comment: socially, variable; moderately heavy use in the past   Drug use: No   Sexual activity: Not Currently  Other Topics Concern   Not on file  Social History Narrative   Patient lives at home a lone has caregivers   Patient has 3 children    Patient is right handed    Social Determinants of Health   Financial Resource Strain: Low Risk  (10/05/2022)   Overall Financial Resource Strain (CARDIA)    Difficulty of Paying Living Expenses: Not hard at all  Food Insecurity: No Food Insecurity (10/05/2022)   Hunger Vital Sign    Worried About Running Out of Food in the Last Year: Never true    Ran Out of Food in the Last Year: Never true  Transportation Needs: No Transportation Needs (10/05/2022)   PRAPARE - Hydrologist (Medical): No    Lack of Transportation (Non-Medical): No  Physical Activity: Inactive (10/05/2022)   Exercise Vital Sign    Days of Exercise per Week: 0 days    Minutes  of Exercise per Session: 0 min  Stress: No Stress Concern Present (10/05/2022)   [place]    Feeling of Stress : Not at all  Social Connections: Socially Isolated (10/05/2022)   Social Connection and Isolation Panel [NHANES]    Frequency of Communication with Friends and Family: More than three times a week    Frequency of Social Gatherings with Friends and Family: Once a week     Attends Religious Services: Never    Marine scientist or Organizations: No    Attends Archivist Meetings: Never    Marital Status: Widowed    Tobacco Counseling Counseling given: Not Answered   Clinical Intake:  Pre-visit preparation completed: Yes  Pain : No/denies pain Pain Score: 0-No pain     BMI - recorded: 22.91 Nutritional Risks: None Diabetes: No  How often do you need to have someone help you when you read instructions, pamphlets, or other written materials from your doctor or pharmacy?: 1 - Never What is the last grade level you completed in school?: HSG  Nutrition Risk Assessment:  Has the patient had any N/V/D within the last 2 months?  No  Does the patient have any non-healing wounds?  No  Has the patient had any unintentional weight loss or weight gain?  No   Diabetes:  Is the patient diabetic?  Yes  If diabetic, was a CBG obtained today?  No  Did the patient bring in their glucometer from home?  No  How often do you monitor your CBG's? No.   Financial Strains and Diabetes Management:  Are you having any financial strains with the device, your supplies or your medication? No .  Does the patient want to be seen by Chronic Care Management for management of their diabetes?  No  Would the patient like to be referred to a Nutritionist or for Diabetic Management?  No   Diabetic Exams:  Diabetic Eye Exam: Completed 05/07/2022 Diabetic Foot Exam: Overdue, Pt has been advised about the importance in completing this exam. Pt is scheduled for diabetic foot exam on next appointment with pcp.   Interpreter Needed?: No  Information entered by :: Lisette Abu, LPN.   Activities of Daily Living    10/05/2022    2:12 PM 02/12/2022   11:00 PM  In your present state of health, do you have any difficulty performing the following activities:  Hearing? 1 0  Vision? 0 1  Difficulty concentrating or making decisions? 0 0  Walking or climbing  stairs? 1 0  Dressing or bathing? 1 1  Doing errands, shopping? 1 1  Preparing Food and eating ? N   Using the Toilet? N   In the past six months, have you accidently leaked urine? N   Do you have problems with loss of bowel control? N   Managing your Medications? N   Managing your Finances? N   Housekeeping or managing your Housekeeping? N     Patient Care Team: Binnie Rail, MD as PCP - General (Internal Medicine) Thompson Grayer, MD as PCP - Electrophysiology (Cardiology) Larey Dresser, MD as PCP - Advanced Heart Failure (Cardiology) Larey Dresser, MD as PCP - Cardiology (Cardiology) Lerry Paterson, MD as Referring Physician (Thoracic Surgery) Larey Dresser, MD as Consulting Physician (Cardiology) Susa Day, MD as Consulting Physician (Orthopedic Surgery) Ladell Pier, MD as Consulting Physician (Oncology) Bond, Tracie Harrier, MD as Referring Physician (Ophthalmology) Charlene Brooke  N, RPH as Pharmacist Warehouse manager)  Indicate any recent Medical Services you may have received from other than [facility] providers in the past year (date may be approximate).     Assessment:   This is a routine wellness examination for Troy [name].  Hearing/Vision screen Hearing Screening - Comments:: Patient has hearing aids. Vision Screening - Comments:: Wears rx glasses - up to date with routine eye exams with Dr. Raynelle Fanning   Dietary issues and exercise activities discussed: Current Exercise Habits: The patient does not participate in regular exercise at present, Exercise limited by: respiratory conditions(s);orthopedic condition(s)   Goals Addressed             This Visit's Progress    Client understands the importance of follow-up with providers by attending scheduled visits        Depression Screen    10/05/2022    2:10 PM 09/18/2021    3:15 PM 08/02/2020   11:24 AM 06/20/2018    4:22 PM 03/21/2018   10:15 AM 11/18/2017   12:30 PM 11/04/2017    2:20 PM  PHQ 2/9  Scores  PHQ - 2 Score 0 0 0 0 0 0 0  PHQ- 9 Score    0 1      Fall Risk    10/05/2022    2:05 PM 09/18/2021    3:17 PM 08/08/2020    1:45 PM 08/02/2020   11:24 AM 03/21/2018   10:15 AM  Fall Risk   Falls in the past year? 0 0 0 0 Yes  Number falls in past yr: 0 0 0 0 2 or more  Injury with Fall? 0 0 0 0 Yes  Risk for fall due to : No Fall Risks No Fall Risks Impaired vision;No Fall Risks No Fall Risks Impaired balance/gait;History of fall(s);Impaired vision  Follow up Falls prevention discussed Falls prevention discussed Falls evaluation completed Falls evaluation completed Falls prevention discussed;Education provided    FALL RISK PREVENTION PERTAINING TO THE HOME:  Any stairs in or around the home? No  If so, are there any without handrails? No  Home free of loose throw rugs in walkways, pet beds, electrical cords, etc? Yes  Adequate lighting in your home to reduce risk of falls? Yes   ASSISTIVE DEVICES UTILIZED TO PREVENT FALLS:  Life alert? Yes  Use of a cane, walker or w/c? Yes  Grab bars in the bathroom? Yes  Shower chair or bench in shower? Yes  Elevated toilet seat or a handicapped toilet? Yes   TIMED UP AND GO:  Was the test performed? No . Phone Visit  Cognitive Function:      08/18/2014    2:54 PM  Montreal Cognitive Assessment   Visuospatial/ Executive (0/5) 2  Naming (0/3) 3  Attention: Read list of digits (0/2) 2  Attention: Read list of letters (0/1) 1  Attention: Serial 7 subtraction starting at 100 (0/3) 3  Language: Repeat phrase (0/2) 2  Language : Fluency (0/1) 0  Abstraction (0/2) 2  Delayed Recall (0/5) 3  Orientation (0/6) 6  Total 24  Adjusted Score (based on education) 24      10/05/2022    2:13 PM  6CIT Screen  What Year? 0 points  What month? 0 points  What time? 0 points  Count back from 20 0 points  Months in reverse 0 points  Repeat phrase 0 points  Total Score 0 points    Immunizations Immunization History  Administered  Date(s) Administered   Fluad  Quad(high Dose 65+) 08/11/2020, 07/11/2022   Influenza Split 07/05/2017   Influenza Whole 07/28/2010   Influenza,inj,Quad PF,6+ Mos 07/07/2013, 07/30/2014   Influenza-Unspecified 07/02/2015, 07/01/2021   PFIZER(Purple Top)SARS-COV-2 Vaccination 10/18/2019, 11/08/2019, 06/14/2020, 04/14/2021   Pneumococcal Conjugate-13 11/28/2015   Pneumococcal Polysaccharide-23 10/02/2007   Td 03/14/2010   Tdap 02/12/2022    TDAP status: Up to date  Flu Vaccine status: Up to date  Pneumococcal vaccine status: Up to date  Covid-19 vaccine status: Completed vaccines  Qualifies for Shingles Vaccine? Yes   Zostavax completed No   Shingrix Completed?: No.    Education has been provided regarding the importance of this vaccine. Patient has been advised to call insurance company to determine out of pocket expense if they have not yet received this vaccine. Advised may also receive vaccine at local pharmacy or Health Dept. Verbalized acceptance and understanding.  Screening Tests Health Maintenance  Topic Date Due   FOOT EXAM  Never done   COVID-19 Vaccine (5 - 2023-24 season) 06/01/2022   [facility] Vaccines- Shingrix (1 of 2) 10/11/2022 (Originally 10/01/1960)   HEMOGLOBIN A1C  01/10/2023   Diabetic kidney evaluation - Urine ACR  01/11/2023   OPHTHALMOLOGY EXAM  05/08/2023   COLONOSCOPY (Pts 45-54yrs Insurance coverage will need to be confirmed)  06/24/2023   Diabetic kidney evaluation - eGFR measurement  09/27/2023   Medicare Annual Wellness (AWV)  10/06/2023   DTaP/Tdap/Td (3 - Td or Tdap) 02/13/2032   Pneumonia Vaccine 62+ Years old  Completed   INFLUENZA VACCINE  Completed   HPV VACCINES  Aged Out    Health Maintenance  Health Maintenance Due  Topic Date Due   FOOT EXAM  Never done   COVID-19 Vaccine (5 - 2023-24 season) 06/01/2022    Colorectal cancer screening: Type of screening: Colonoscopy. Completed 06/23/2020. Repeat every 3 years  Lung Cancer  Screening: (Low Dose CT Chest recommended if Age 31-80 years, 30 pack-year currently smoking OR have quit w/in 15years.) does not qualify.   Lung Cancer Screening Referral: no  Additional Screening:  Hepatitis C Screening: does not qualify; Completed no  Vision Screening: Recommended annual ophthalmology exams for early detection of glaucoma and other disorders of the eye. Is the patient up to date with their annual eye exam?  Yes  Who is the provider or what is the name of the office in which the patient attends annual eye exams? Raynelle Fanning, MD. If pt is not established with a provider, would they like to be referred to a provider to establish care? No .   Dental Screening: Recommended annual dental exams for proper oral hygiene  Community Resource Referral / Chronic Care Management: CRR required this visit?  No   CCM required this visit?  No      Plan:     I have personally reviewed and noted the following in the patient's chart:   Medical and social history Use of alcohol, tobacco or illicit drugs  Current medications and supplements including opioid prescriptions. Patient is not currently taking opioid prescriptions. Functional ability and status Nutritional status Physical activity Advanced directives List of other physicians Hospitalizations, surgeries, and ER visits in previous 12 months Vitals Screenings to include cognitive, depression, and falls Referrals and appointments  In addition, I have reviewed and discussed with patient certain preventive protocols, quality metrics, and best practice recommendations. A written personalized care plan for preventive services as well as general preventive health recommendations were provided to patient.     Sheral Flow, LPN  10/05/2022   Nurse Notes: N/A

## 2022-10-08 ENCOUNTER — Telehealth: Payer: Self-pay | Admitting: Pulmonary Disease

## 2022-10-08 NOTE — Telephone Encounter (Signed)
Attempted to call pt's daughter Troy [name] but unable to reach. Left message for her to return call.

## 2022-10-08 NOTE — Telephone Encounter (Signed)
Spoke with daughter who states pt's C-pap is so old it has stopped working. Pt currently sees dr. Valeta Harms who does not manage sleep. It does not appear that C-Pap has ever been managed by our office. Pt's daughter was offered first available sleep consult with NP. Pt was scheduled for sleep consult with Tammy Parret on 10/19/22. Nothing further needed at this time.

## 2022-10-08 NOTE — Telephone Encounter (Signed)
PT daughter calling again. Ret Emily's call re: CPAP. Please try her again.    [phone]

## 2022-10-09 ENCOUNTER — Encounter: Payer: Self-pay | Admitting: Internal Medicine

## 2022-10-09 ENCOUNTER — Encounter (HOSPITAL_COMMUNITY): Payer: Self-pay | Admitting: Cardiology

## 2022-10-09 DIAGNOSIS — G4733 Obstructive sleep apnea (adult) (pediatric): Secondary | ICD-10-CM

## 2022-10-11 DIAGNOSIS — M4800 Spinal stenosis, site unspecified: Secondary | ICD-10-CM | POA: Diagnosis not present

## 2022-10-11 DIAGNOSIS — E785 Hyperlipidemia, unspecified: Secondary | ICD-10-CM | POA: Diagnosis not present

## 2022-10-11 DIAGNOSIS — E1122 Type 2 diabetes mellitus with diabetic chronic kidney disease: Secondary | ICD-10-CM | POA: Diagnosis not present

## 2022-10-11 DIAGNOSIS — N400 Enlarged prostate without lower urinary tract symptoms: Secondary | ICD-10-CM | POA: Diagnosis not present

## 2022-10-11 DIAGNOSIS — H3550 Unspecified hereditary retinal dystrophy: Secondary | ICD-10-CM | POA: Diagnosis not present

## 2022-10-11 DIAGNOSIS — F419 Anxiety disorder, unspecified: Secondary | ICD-10-CM | POA: Diagnosis not present

## 2022-10-11 DIAGNOSIS — N1832 Chronic kidney disease, stage 3b: Secondary | ICD-10-CM | POA: Diagnosis not present

## 2022-10-11 DIAGNOSIS — I714 Abdominal aortic aneurysm, without rupture, unspecified: Secondary | ICD-10-CM | POA: Diagnosis not present

## 2022-10-11 DIAGNOSIS — F32A Depression, unspecified: Secondary | ICD-10-CM | POA: Diagnosis not present

## 2022-10-11 DIAGNOSIS — H547 Unspecified visual loss: Secondary | ICD-10-CM | POA: Diagnosis not present

## 2022-10-11 DIAGNOSIS — H409 Unspecified glaucoma: Secondary | ICD-10-CM | POA: Diagnosis not present

## 2022-10-11 DIAGNOSIS — I482 Chronic atrial fibrillation, unspecified: Secondary | ICD-10-CM | POA: Diagnosis not present

## 2022-10-11 DIAGNOSIS — Z7984 Long term (current) use of oral hypoglycemic drugs: Secondary | ICD-10-CM | POA: Diagnosis not present

## 2022-10-11 DIAGNOSIS — J9601 Acute respiratory failure with hypoxia: Secondary | ICD-10-CM | POA: Diagnosis not present

## 2022-10-11 DIAGNOSIS — I5042 Chronic combined systolic (congestive) and diastolic (congestive) heart failure: Secondary | ICD-10-CM | POA: Diagnosis not present

## 2022-10-11 DIAGNOSIS — J449 Chronic obstructive pulmonary disease, unspecified: Secondary | ICD-10-CM | POA: Diagnosis not present

## 2022-10-11 DIAGNOSIS — E1151 Type 2 diabetes mellitus with diabetic peripheral angiopathy without gangrene: Secondary | ICD-10-CM | POA: Diagnosis not present

## 2022-10-11 DIAGNOSIS — I2510 Atherosclerotic heart disease of native coronary artery without angina pectoris: Secondary | ICD-10-CM | POA: Diagnosis not present

## 2022-10-11 DIAGNOSIS — I130 Hypertensive heart and chronic kidney disease with heart failure and stage 1 through stage 4 chronic kidney disease, or unspecified chronic kidney disease: Secondary | ICD-10-CM | POA: Diagnosis not present

## 2022-10-11 DIAGNOSIS — M1990 Unspecified osteoarthritis, unspecified site: Secondary | ICD-10-CM | POA: Diagnosis not present

## 2022-10-11 DIAGNOSIS — K219 Gastro-esophageal reflux disease without esophagitis: Secondary | ICD-10-CM | POA: Diagnosis not present

## 2022-10-11 DIAGNOSIS — I6521 Occlusion and stenosis of right carotid artery: Secondary | ICD-10-CM | POA: Diagnosis not present

## 2022-10-11 DIAGNOSIS — G4733 Obstructive sleep apnea (adult) (pediatric): Secondary | ICD-10-CM | POA: Diagnosis not present

## 2022-10-11 DIAGNOSIS — R32 Unspecified urinary incontinence: Secondary | ICD-10-CM | POA: Diagnosis not present

## 2022-10-12 ENCOUNTER — Telehealth: Payer: Self-pay | Admitting: Internal Medicine

## 2022-10-12 NOTE — Telephone Encounter (Signed)
Okay for orders? 

## 2022-10-12 NOTE — Telephone Encounter (Signed)
Caller & What Company:  Troy [name]   [facility]  Phone [phone]   Needs Verbal orders for what service & frequency:Home health, physical therapy - 1 x a week for 1 week, 2 times a week for 4 weeks, and then 1 time a week x 4 weeks

## 2022-10-14 ENCOUNTER — Other Ambulatory Visit (HOSPITAL_COMMUNITY): Payer: Self-pay | Admitting: Cardiology

## 2022-10-15 ENCOUNTER — Other Ambulatory Visit (HOSPITAL_COMMUNITY): Payer: Self-pay | Admitting: Internal Medicine

## 2022-10-15 NOTE — Telephone Encounter (Signed)
Message left today with verbals on number given in message.

## 2022-10-16 ENCOUNTER — Inpatient Hospital Stay: Payer: PPO | Admitting: Oncology

## 2022-10-16 ENCOUNTER — Encounter: Payer: Self-pay | Admitting: Oncology

## 2022-10-16 ENCOUNTER — Inpatient Hospital Stay: Payer: PPO | Attending: Oncology

## 2022-10-16 VITALS — BP 136/73 | HR 83 | Temp 98.1°F | Resp 18 | Ht 73.5 in | Wt 176.0 lb

## 2022-10-16 DIAGNOSIS — C187 Malignant neoplasm of sigmoid colon: Secondary | ICD-10-CM

## 2022-10-16 DIAGNOSIS — Z862 Personal history of diseases of the blood and blood-forming organs and certain disorders involving the immune mechanism: Secondary | ICD-10-CM | POA: Insufficient documentation

## 2022-10-16 DIAGNOSIS — C3431 Malignant neoplasm of lower lobe, right bronchus or lung: Secondary | ICD-10-CM | POA: Insufficient documentation

## 2022-10-16 DIAGNOSIS — I4891 Unspecified atrial fibrillation: Secondary | ICD-10-CM | POA: Insufficient documentation

## 2022-10-16 DIAGNOSIS — J90 Pleural effusion, not elsewhere classified: Secondary | ICD-10-CM | POA: Insufficient documentation

## 2022-10-16 DIAGNOSIS — J449 Chronic obstructive pulmonary disease, unspecified: Secondary | ICD-10-CM | POA: Insufficient documentation

## 2022-10-16 LAB — CEA (ACCESS): CEA (CHCC): 2.3 ng/mL (ref 0.00–5.00)

## 2022-10-16 NOTE — Progress Notes (Signed)
[facility] OFFICE PROGRESS NOTE   Diagnosis: Colon cancer, non-small cell lung cancer  INTERVAL HISTORY:   Troy [name] returns as scheduled.  He saw Dr. Tonia Brooms in October after undergoing a repeat chest CT.  A decision was made to continue observation of the right lung nodule.  He is scheduled for a follow-up chest CT and pulmonary visit at a 18-month interval.  Troy [name] was admitted in November with respiratory failure due to COPD and suspected pneumonia.  He was discharged to skilled nursing facility and is now in assisted living facility.  He reports losing weight with diuresis and a diminished appetite.  He has diminished vision.  No specific complaint today.   Objective:  Vital signs in last 24 hours:  Blood pressure 136/73, pulse 83, temperature 98.1 F (36.7 C), temperature source Oral, resp. rate 18, height 6' 1.5" (1.867 m), weight 176 lb (79.8 kg), SpO2 95 %.    HEENT: Neck without mass Lymphatics: No cervical, supraclavicular, axillary, or inguinal nodes Resp: Decreased breath sounds at the right lower posterior chest, no respiratory distress Cardio: Regular rate and rhythm GI: Nontender, no mass, no hepatosplenomegaly Vascular: No leg edema, left lower leg is larger than the right side Neuro: Ambulates with an unsteady gait   Lab Results:  Lab Results  Component Value Date   WBC 5.5 08/29/2022   HGB 14.0 08/29/2022   HCT 39.2 08/29/2022   MCV 87.1 08/29/2022   PLT 259 08/29/2022   NEUTROABS 5.7 08/26/2022    CMP  Lab Results  Component Value Date   NA 138 09/26/2022   K 4.1 09/26/2022   CL 101 09/26/2022   CO2 28 09/26/2022   GLUCOSE 120 (H) 09/26/2022   BUN 14 09/26/2022   CREATININE 1.58 (H) 09/26/2022   CALCIUM 8.9 09/26/2022   PROT 6.2 (L) 08/26/2022   ALBUMIN 3.1 (L) 08/26/2022   AST 23 08/26/2022   ALT 18 08/26/2022   ALKPHOS 54 08/26/2022   BILITOT 0.7 08/26/2022   GFRNONAA 44 (L) 09/26/2022   GFRAA 74 05/16/2020    Lab  Results  Component Value Date   CEA1 1.19 06/19/2021   CEA 2.35 04/19/2022    Medications: I have reviewed the patient's current medications.   Assessment/Plan: Adenocarcinoma of sigmoid colon-stage II (T3N0)  Colonoscopy 06/25/2018- multiple polyps in the ascending and descending colon, partially obstructing mass in the sigmoid colon-biopsy confirmed adenocarcinoma CTs 06/25/2018-enlarging right paratracheal lymph node compared to November 2018, no other evidence of metastatic disease Low anterior resection 12/12/2018, stage II (T3N0) adenocarcinoma the sigmoid colon, no loss of mismatch repair protein expression, MSI-stable Colonoscopy 06/23/2020-polyps removed from the transverse colon, ascending colon, and cecum-tubular adenomas  CT chest 01/14/2020-stable mildly enlarged high right paratracheal node and right hilar node CT abdomen/pelvis 01/29/2020-no evidence of recurrent disease, stable right lower lobe nodule CT chest 01/24/2021-mild growth of 2 irregular basilar right upper lobe nodules, stable high right mediastinal node PET scan 02/08/2021- band of nodules in the right upper lobe including a 13 mm nodule with an SUV of 3.2 and a 13 mm nodule with an SUV of 1.4.,  12 mm nodule in the upper right mediastinum with an SUV of 3.5      2.  History of iron deficiency anemia Secondary to bleeding from tumor, exacerbated by anticoagulation with eliquis  3. 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 Ewing Schlein at [facility]. Last Ct chest 05/01/18 stable, without concerning evidence of recurrence  -CT 06/25/2018- nonspecific small bilateral nodules, enlarging superior right paratracheal node -EUS biopsy of the  right paratracheal nodes (several rounded nodes measuring 4 cm in total) on 07/10/2018- negative for malignancy -CT chest 03/03/2019- stable postoperative appearance of the chest, unchanged mildly prominent right paratracheal lymph node, no evidence of metastatic disease -CT chest 01/14/2020-stable surgical changes in the right chest, stable mild mediastinal and right hilar adenopathy -CT chest 01/24/2021- mild interval growth of irregular nodular opacities in the right upper lobe along the minor fissure -PET 02/08/2021-band of hypermetabolic nodules in the inferior aspect of the right upper lobe-indeterminate, nodularity increased over time.  Hypermetabolic nodule in the right upper mediastinum, similar small right effusion -CT super D chest 03/21/2021-consolidation and nodularity in the posterior inferior right upper lobe slightly diminished, unchanged loculated small right effusion, fine clustered centrilobular nodularity concentrated in the left upper lobe-unchanged.  No enlarged mediastinal, hilar, or axillary nodes -Right pleural fluid 07/05/2021-reactive mesothelial cells -CT super D chest 09/12/2021-decrease in size of right paratracheal and left mediastinal nodes, stable small right pleural effusion, right lower lobectomy, similar soft tissue thickening in the inferior right upper lobe, upper lobe predominant centrilobular nodularity favored to represent bronchiolitis -CT super D chest 04/09/2022-increased nodular thickening at the minor fissure, increased masslike density in the inferior right upper lobe, stable right pleural effusion -PET 04/24/2022-enlarging bandlike opacity in the inferior right upper lobe low-level hypermetabolic activity-slightly increased from the previous PET, no hypermetabolic activity in the anterior nodularity, no evidence of metastatic disease, stable chronic loculated right pleural effusion -CT super D chest 07/20/2022-unchanged appearance of tracer avid bandlike opacity in  the inferior right upper lobe-indeterminant, nodular area at the anterior basal right upper lobe unchanged    4. COPD 5. Atrial fibrillation 6. Thyrotoxicosis-potentially amiodarone related 7.  Multiple gastric polyps on endoscopy 06/23/2020-1 oozing polyp-hyperplastic polyp, biopsy from the gastric antrum revealed intestinal metaplasia EGD 02/13/2022-gastric polyps-1 oozing polyp resected, nonbleeding duodenal ulcer with a visible vessel injected with epinephrine and treated with a heater probe, pathology revealed a hyperplastic gastric polyp     Disposition: Troy [name] is in clinical remission from lung cancer and colon cancer.  He has an indeterminate right lung density on repeat imaging.  He is scheduled for a follow-up chest CT and pulmonary visit in April.  He will return for office visit in 6 months.  We will follow-up on the CEA from today.  He continues follow-up with other providers for multiple comorbid conditions.  Thornton Papas, MD  10/16/2022  3:17 PM

## 2022-10-19 ENCOUNTER — Ambulatory Visit (INDEPENDENT_AMBULATORY_CARE_PROVIDER_SITE_OTHER): Payer: PPO | Admitting: Adult Health

## 2022-10-19 ENCOUNTER — Encounter: Payer: Self-pay | Admitting: Adult Health

## 2022-10-19 VITALS — BP 140/60 | HR 72 | Temp 97.8°F | Ht 73.0 in | Wt 180.4 lb

## 2022-10-19 DIAGNOSIS — C3491 Malignant neoplasm of unspecified part of right bronchus or lung: Secondary | ICD-10-CM

## 2022-10-19 DIAGNOSIS — G4733 Obstructive sleep apnea (adult) (pediatric): Secondary | ICD-10-CM | POA: Diagnosis not present

## 2022-10-19 DIAGNOSIS — J449 Chronic obstructive pulmonary disease, unspecified: Secondary | ICD-10-CM

## 2022-10-19 NOTE — Progress Notes (Signed)
@Patient  ID: Troy Kindred [name], male    DOB: 16-Jun-1941, 82 y.o.   MRN: [id number]  Chief Complaint  Patient presents with   Consult    Referring provider: Binnie Rail, MD  HPI: 82 year old male former smoker followed for COPD, lung nodule, squamous cell carcinoma of the right lung and sleep apnea Medical history significant for A-fib, previous colon cancer  TEST/EVENTS :  2017 Mild with AHI overall 6/hr but severe during REM sleep with AHI 30/hr with oxygen saturations < 88% for 5.5 minutes of total sleep time.  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 Elenor Quinones at [facility]. Last Ct chest 05/01/18 stable, without concerning evidence of recurrence  -CT 06/25/2018- nonspecific small bilateral nodules, enlarging superior right paratracheal node -EUS biopsy of the right paratracheal nodes (several rounded nodes measuring 4 cm in total) on 07/10/2018- negative for malignancy -CT chest 03/03/2019- stable postoperative appearance of the chest, unchanged mildly prominent right paratracheal lymph node, no evidence of metastatic disease -CT chest 01/14/2020-stable surgical changes in the right chest, stable mild mediastinal and right hilar adenopathy -CT chest 01/24/2021- mild interval growth of irregular nodular opacities in the right upper lobe along the minor fissure -PET 11/14/863-HQIO of hypermetabolic nodules in the inferior aspect of the right upper lobe-indeterminate, nodularity increased over time.  Hypermetabolic nodule in the right upper mediastinum, similar small right effusion -CT super D chest 03/21/2021-consolidation and nodularity in the posterior inferior right  upper lobe slightly diminished, unchanged loculated small right effusion, fine clustered centrilobular nodularity concentrated in the left upper lobe-unchanged.  No enlarged mediastinal, hilar, or axillary nodes -Right pleural fluid 07/05/2021-reactive mesothelial cells -CT super D chest 09/12/2021-decrease in size of right paratracheal and left mediastinal nodes, stable small right pleural effusion, right lower lobectomy, similar soft tissue thickening in the inferior right upper lobe, upper lobe predominant centrilobular nodularity favored to represent bronchiolitis -CT super D chest 04/09/2022-increased nodular thickening at the minor fissure, increased masslike density in the inferior right upper lobe, stable right pleural effusion -PET 04/24/2022-enlarging bandlike opacity in the inferior right upper lobe low-level hypermetabolic activity-slightly increased from the previous PET, no hypermetabolic activity in the anterior nodularity, no evidence of metastatic disease, stable chronic loculated right pleural effusion -CT super D chest 07/20/2022-unchanged appearance of tracer avid bandlike opacity in the inferior right upper lobe-indeterminant, nodular area at the anterior basal right upper lobe unchanged  10/19/2022 Follow up : OSA , COPD . Lung cancer  Patient returns today for a follow-up visit.  Patient has sleep apnea is been on CPAP for several years.  Recently his CPAP machine gave an error message that his motor life had exceeded life expectancy.  Patient says 2 weeks ago it stopped working completely.  Patient says he cannot sleep without it.  Needs a new CPAP. Patient is accompanied by his caregiver today.  Patient was diagnosed with sleep apnea in 2017.  Has been on CPAP since then.  Patient says he feels tired during the daytime and has restless sleep without his CPAP. We have contacted his homecare company and placed an order for new CPAP.  Patient has a history of lung cancer status post  right lower lobectomy.  He also has a known right upper lobe lung nodule that is being followed serially on the CT scan.  He has an upcoming CT and 3 to 4 months.  He says overall breathing is doing okay.  He has COPD and is on Breztri twice daily.  Denies any flare of cough or wheezing     Allergies  Allergen Reactions   Niacin-Lovastatin Er Shortness Of Breath and Other (See Comments)    Dyspnea Flushing   Penicillins Hives, Shortness Of Breath and Other (See Comments)    Dyspnea Flushing Because of a history of documented adverse serious drug reaction;Medi Alert bracelet  is recommended PCN reaction causing immediate rash, facial/tongue/throat swelling, SOB or lightheadedness with hypotension: Yes PCN reaction causing severe rash involving mucus membranes or skin necrosis: No PCN reaction occurring within the last 10 years: NO PCN reaction that required hospitalization: NO   Sulfonamide Derivatives Hives, Shortness Of Breath and Other (See Comments)    Dyspnea  Flushing Because of a history of documented adverse serious drug reaction;Medi Alert bracelet  is recommended   Lipitor [Atorvastatin] Other (See Comments)    Myalgias Arthralgias     Immunization History  Administered Date(s) Administered   Fluad Quad(high Dose 65+) 08/11/2020, 07/11/2022   Influenza Split 07/05/2017   Influenza Whole 07/28/2010   Influenza,inj,Quad PF,6+ Mos 07/07/2013, 07/30/2014   Influenza-Unspecified 07/02/2015, 07/01/2021   PFIZER(Purple Top)SARS-COV-2 Vaccination 10/18/2019, 11/08/2019, 06/14/2020, 04/14/2021   Pneumococcal Conjugate-13 11/28/2015   Pneumococcal Polysaccharide-23 10/02/2007   Td 03/14/2010   Tdap 02/12/2022    Past Medical History:  Diagnosis Date   AAA (abdominal aortic aneurysm) (HCC) 10-20-17   MONITORED BY DR Edilia Bo, 3.7 CM by ABDOMINAL US 11/2021   Anxiety    Arthritis    Atrial fibrillation (HCC)    Basal cell carcinoma    BPH (benign prostatic hypertrophy)     Chronic diastolic heart failure (HCC)    Chronic kidney disease    stage 3, pt unaware   Colon cancer (HCC)    COPD (chronic obstructive pulmonary disease) (HCC)    Depression    GERD (gastroesophageal reflux disease)    Glaucoma BOTH EYES   Dr Nile Riggs   History of lung cancer APRIL 2012   ONCOLOGIST- DR Mariel Sleet  LOV IN [facility] 10-27-2012 SQUAMOUS CELL---- S/P RIGHT LOWER LOBECTOMY AT [facility] -- NO CHEMORADIATION--- NO RECURRENCE   Hx of adenomatous colonic polyps 2005    X 2; 1 hyperplastic polyp; Dr Juanda Chance   Hyperlipidemia    Hypertension    OSA (obstructive sleep apnea) 08/29/2015   CPAP SET ON 10   Peripheral vascular disease (HCC)    FOLLOWED  BY DR DICKSON   Spinal stenosis 06/2014   Thoracic aorta atherosclerosis (HCC)    Thyrotoxicosis    amiodarone induced    Tobacco History: Social History   Tobacco Use  Smoking Status Former   Packs/day: 0.50   Years: 50.00   Total pack years: 25.00   Types: Cigarettes   Quit date: 07/28/2014   Years since quitting: 8.2  Smokeless Tobacco Never   Counseling given: Not Answered   Outpatient Medications Prior to Visit  Medication Sig Dispense Refill   acetaminophen (TYLENOL) 325 MG tablet Take 650 mg by mouth every 6 (six) hours as needed for moderate pain or headache.     albuterol (VENTOLIN HFA) 108 (90 Base) MCG/ACT inhaler Inhale 2 puffs into the lungs every 6 (six) hours as needed for wheezing or shortness of breath. 8.5 g  0   amLODipine (NORVASC) 10 MG tablet Take 1 tablet (10 mg total) by mouth daily. 90 tablet 3   Brinzolamide-Brimonidine (SIMBRINZA) 1-0.2 % SUSP Place 1 drop into both eyes 3 (three) times daily.     Budeson-Glycopyrrol-Formoterol (BREZTRI AEROSPHERE) 160-9-4.8 MCG/ACT AERO Inhale 2 puffs into the lungs in the morning and at bedtime. (Patient taking differently: Inhale 2 puffs into the lungs daily.) 10.7 g 0   docusate sodium (COLACE) 100 MG capsule Take 100 mg by mouth 2 (two) times daily as needed  (constipation).     escitalopram (LEXAPRO) 10 MG tablet TAKE ONE TABLET BY MOUTH EVERY DAY (Patient taking differently: Take 10 mg by mouth daily.) 28 tablet 5   furosemide (LASIX) 20 MG tablet Take 4 tablets (80 mg total) by mouth 2 (two) times daily. 450 tablet 3   JARDIANCE 10 MG TABS tablet TAKE ONE TABLET BY MOUTH EVERY DAY BEFORE BREAKFAST. (Patient taking differently: Take 10 mg by mouth daily.) 30 tablet 11   latanoprost (XALATAN) 0.005 % ophthalmic solution Place 1 drop into both eyes at bedtime.     Melatonin 10 MG TABS Take 20 mg by mouth at bedtime.     metoprolol succinate (TOPROL-XL) 25 MG 24 hr tablet Take 1 tablet (25 mg total) by mouth daily. (Patient taking differently: Take 25 mg by mouth at bedtime.) 90 tablet 3   montelukast (SINGULAIR) 10 MG tablet TAKE ONE TABLET BY MOUTH AT BEDTIME (Patient taking differently: Take 10 mg by mouth at bedtime.) 28 tablet 5   omeprazole (PRILOSEC) 20 MG capsule Take 1 capsule (20 mg total) by mouth 2 (two) times daily. 60 capsule 3   OVER THE COUNTER MEDICATION Take 3 capsules by mouth daily. Balance of Nature Fruits     OVER THE COUNTER MEDICATION Take 3 capsules by mouth daily. Balance of Nature Veggies     Polyethyl Glycol-Propyl Glycol (SYSTANE ULTRA) 0.4-0.3 % SOLN Place 1 drop into both eyes daily as needed (dry eyes).     potassium chloride SA (KLOR-CON M) 20 MEQ tablet TAKE TWO TABLETS BY MOUTH IN THE MORNING AND TAKE ONE TABLET IN THE EVENING (Patient taking differently: Take 20-40 mEq by mouth See admin instructions. 40 mEq in the morning, 20 mEq in the evening) 90 tablet 6   Probiotic Product (DIGESTIVE ADVANTAGE) CAPS Take 1 capsule by mouth daily.     rosuvastatin (CRESTOR) 40 MG tablet TAKE ONE TABLET BY MOUTH EVERY DAY (Patient taking differently: Take 40 mg by mouth daily.) 90 tablet 3   apixaban (ELIQUIS) 2.5 MG TABS tablet Take 1 tablet (2.5 mg total) by mouth 2 (two) times daily. 60 tablet 0   spironolactone (ALDACTONE) 25 MG  tablet Take 1 tablet (25 mg total) by mouth daily. 30 tablet 0   No facility-administered medications prior to visit.     Review of Systems:   Constitutional:   No  weight loss, night sweats,  Fevers, chills,  +fatigue, or  lassitude.  HEENT:   No headaches,  Difficulty swallowing,  Tooth/dental problems, or  Sore throat,                No sneezing, itching, ear ache, nasal congestion, post nasal drip,   CV:  No chest pain,  Orthopnea, PND, swelling in lower extremities, anasarca, dizziness, palpitations, syncope.   GI  No heartburn, indigestion, abdominal pain, nausea, vomiting, diarrhea, change in bowel habits, loss of appetite, bloody stools.   Resp:  No excess mucus, no productive  cough,  No non-productive cough,  No coughing up of blood.  No change in color of mucus.  No wheezing.  No chest wall deformity  Skin: no rash or lesions.  GU: no dysuria, change in color of urine, no urgency or frequency.  No flank pain, no hematuria   MS:  No joint pain or swelling.  No decreased range of motion.  No back pain.    Physical Exam  BP (!) 140/60 (BP Location: Right Arm, Patient Position: Sitting, Cuff Size: Normal)   Pulse 72   Temp 97.8 F (36.6 C) (Oral)   Ht 6\' 1"  (1.854 m)   Wt 180 lb 6.4 oz (81.8 kg)   SpO2 95%   BMI 23.80 kg/m   GEN: A/Ox3; pleasant , NAD, well nourished , wc    HEENT:  [place]/AT,  , NOSE-clear, THROAT-clear, no lesions, no postnasal drip or exudate noted.   NECK:  Supple w/ fair ROM; no JVD; normal carotid impulses w/o bruits; no thyromegaly or nodules palpated; no lymphadenopathy.    RESP  Clear  P & A; w/o, wheezes/ rales/ or rhonchi. no accessory muscle use, no dullness to percussion  CARD:  RRR, no m/r/g, no peripheral edema, pulses intact, no cyanosis or clubbing.  GI:   Soft & nt; nml bowel sounds; no organomegaly or masses detected.   Musco: Warm bil, no deformities or joint swelling noted.   Neuro: alert, no focal deficits noted.     Skin: Warm, no lesions or rashes    Lab Results:  CBC     Imaging: No results found.       Latest Ref Rng & Units 08/13/2017    3:00 PM 10/04/2016   11:03 AM  PFT Results  FVC-Pre L 3.10  3.14   FVC-Predicted Pre % 67  67   FVC-Post L  3.26   FVC-Predicted Post %  70   Pre FEV1/FVC % % 64  62   Post FEV1/FCV % %  64   FEV1-Pre L 1.98  1.96   FEV1-Predicted Pre % 59  58   FEV1-Post L  2.10   DLCO uncorrected ml/min/mmHg 12.40  14.56   DLCO UNC% % 34  40   DLCO corrected ml/min/mmHg  14.44   DLCO COR %Predicted %  40   DLVA Predicted % 56  59   TLC L 7.39  5.77   TLC % Predicted % 97  76   RV % Predicted % 156  92     No results found for: "NITRICOXIDE"      Assessment & Plan:   OSA (obstructive sleep apnea) Longstanding sleep apnea on nocturnal CPAP.  Patient is on auto CPAP 4 to 10 cm H2O. We have contacted his homecare company placed order for new CPAP.  Patient is to restart CPAP once available.  Patient education was given. Will get CPAP download on return visit  Plan  Patient Instructions  Order for new CPAP .  Restart CPAP At bedtime  when available .  Work on healthy sleep regimen  Do not drive if sleep  CT chest as planned  Follow up in 3 months and As needed      COPD GOLD II  Appears compensated continue on current regimen.  Follow-up Dr. 12/02/2016 in 3 months and as needed  Squamous cell lung cancer [facility]) History of lung cancer-has been stable on serial CT.  Will continue to follow lung nodule stability.  CT chest is pending for March.  Follow-up with Dr. Tonia Brooms as planned     Rubye Oaks, NP 10/19/2022

## 2022-10-19 NOTE — Assessment & Plan Note (Signed)
History of lung cancer-has been stable on serial CT.  Will continue to follow lung nodule stability.  CT chest is pending for March.  Follow-up with Dr. Tonia Brooms as planned

## 2022-10-19 NOTE — Assessment & Plan Note (Signed)
Appears compensated continue on current regimen.  Follow-up Dr. Tonia Brooms in 3 months and as needed

## 2022-10-19 NOTE — Patient Instructions (Signed)
Order for new CPAP .  Restart CPAP At bedtime  when available .  Work on healthy sleep regimen  Do not drive if sleep  CT chest as planned  Follow up in 3 months and As needed

## 2022-10-19 NOTE — Assessment & Plan Note (Signed)
Longstanding sleep apnea on nocturnal CPAP.  Patient is on auto CPAP 4 to 10 cm H2O. We have contacted his homecare company placed order for new CPAP.  Patient is to restart CPAP once available.  Patient education was given. Will get CPAP download on return visit  Plan  Patient Instructions  Order for new CPAP .  Restart CPAP At bedtime  when available .  Work on healthy sleep regimen  Do not drive if sleep  CT chest as planned  Follow up in 3 months and As needed

## 2022-10-22 ENCOUNTER — Ambulatory Visit: Payer: PPO | Attending: Cardiovascular Disease

## 2022-10-22 DIAGNOSIS — I480 Paroxysmal atrial fibrillation: Secondary | ICD-10-CM | POA: Diagnosis not present

## 2022-10-22 DIAGNOSIS — D485 Neoplasm of uncertain behavior of skin: Secondary | ICD-10-CM | POA: Diagnosis not present

## 2022-10-22 DIAGNOSIS — L821 Other seborrheic keratosis: Secondary | ICD-10-CM | POA: Diagnosis not present

## 2022-10-22 DIAGNOSIS — Z85828 Personal history of other malignant neoplasm of skin: Secondary | ICD-10-CM | POA: Diagnosis not present

## 2022-10-22 DIAGNOSIS — D692 Other nonthrombocytopenic purpura: Secondary | ICD-10-CM | POA: Diagnosis not present

## 2022-10-22 DIAGNOSIS — C44319 Basal cell carcinoma of skin of other parts of face: Secondary | ICD-10-CM | POA: Diagnosis not present

## 2022-10-24 LAB — CUP PACEART REMOTE DEVICE CHECK
Date Time Interrogation Session: 20240119233050
Implantable Pulse Generator Implant Date: 20200709

## 2022-10-25 NOTE — Progress Notes (Signed)
[facility] Summary Report / Loop Recorder

## 2022-10-29 DIAGNOSIS — J449 Chronic obstructive pulmonary disease, unspecified: Secondary | ICD-10-CM | POA: Diagnosis not present

## 2022-10-29 DIAGNOSIS — R2681 Unsteadiness on feet: Secondary | ICD-10-CM | POA: Diagnosis not present

## 2022-10-29 DIAGNOSIS — K219 Gastro-esophageal reflux disease without esophagitis: Secondary | ICD-10-CM | POA: Diagnosis not present

## 2022-10-29 DIAGNOSIS — I509 Heart failure, unspecified: Secondary | ICD-10-CM | POA: Diagnosis not present

## 2022-10-29 DIAGNOSIS — R531 Weakness: Secondary | ICD-10-CM | POA: Diagnosis not present

## 2022-11-06 ENCOUNTER — Telehealth: Payer: Self-pay | Admitting: Adult Health

## 2022-11-07 NOTE — Telephone Encounter (Signed)
Attempted to call [employer] but was on hold for too long. 8 mins. Will try to call again to figure out cpap order.

## 2022-11-09 NOTE — Telephone Encounter (Signed)
Message received back from [employer]: New, Drucilla Chalet, Waldemar Dickens, [name]; Gaye Pollack; Minus Liberty hello [place],  I messaged the rep that is working this account for follow up. it looks like its on hold for auth. review at the moment.  I sent a copy of your message as well.  Thank you,  Demetrius Charity

## 2022-11-09 NOTE — Telephone Encounter (Signed)
Community message sent to [employer]. Will await response.

## 2022-11-12 NOTE — Telephone Encounter (Signed)
Please call [name] (daughter)  to advise of progress @ [phone].

## 2022-11-12 NOTE — Telephone Encounter (Signed)
New response received:  Troy [name], Troy [name], CMA; Troy [name]; Troy [name]   Noted and sent to auth intake team. Thank you     Called and spoke with pt's daughter [name] and updated her on what I had found out from [employer] and she verbalized understanding.

## 2022-11-12 NOTE — Telephone Encounter (Signed)
Sent a new community message to [employer]. Will update when receive a response.

## 2022-11-20 DIAGNOSIS — I6521 Occlusion and stenosis of right carotid artery: Secondary | ICD-10-CM | POA: Diagnosis not present

## 2022-11-20 DIAGNOSIS — I130 Hypertensive heart and chronic kidney disease with heart failure and stage 1 through stage 4 chronic kidney disease, or unspecified chronic kidney disease: Secondary | ICD-10-CM | POA: Diagnosis not present

## 2022-11-20 DIAGNOSIS — E1122 Type 2 diabetes mellitus with diabetic chronic kidney disease: Secondary | ICD-10-CM | POA: Diagnosis not present

## 2022-11-20 DIAGNOSIS — Z7984 Long term (current) use of oral hypoglycemic drugs: Secondary | ICD-10-CM | POA: Diagnosis not present

## 2022-11-20 DIAGNOSIS — E1151 Type 2 diabetes mellitus with diabetic peripheral angiopathy without gangrene: Secondary | ICD-10-CM | POA: Diagnosis not present

## 2022-11-20 DIAGNOSIS — N1832 Chronic kidney disease, stage 3b: Secondary | ICD-10-CM | POA: Diagnosis not present

## 2022-11-20 DIAGNOSIS — I5042 Chronic combined systolic (congestive) and diastolic (congestive) heart failure: Secondary | ICD-10-CM | POA: Diagnosis not present

## 2022-11-20 DIAGNOSIS — M1990 Unspecified osteoarthritis, unspecified site: Secondary | ICD-10-CM | POA: Diagnosis not present

## 2022-11-20 DIAGNOSIS — J449 Chronic obstructive pulmonary disease, unspecified: Secondary | ICD-10-CM | POA: Diagnosis not present

## 2022-11-20 DIAGNOSIS — J9601 Acute respiratory failure with hypoxia: Secondary | ICD-10-CM | POA: Diagnosis not present

## 2022-11-20 DIAGNOSIS — K219 Gastro-esophageal reflux disease without esophagitis: Secondary | ICD-10-CM | POA: Diagnosis not present

## 2022-11-20 DIAGNOSIS — M4800 Spinal stenosis, site unspecified: Secondary | ICD-10-CM | POA: Diagnosis not present

## 2022-11-20 DIAGNOSIS — I482 Chronic atrial fibrillation, unspecified: Secondary | ICD-10-CM | POA: Diagnosis not present

## 2022-11-20 DIAGNOSIS — H3550 Unspecified hereditary retinal dystrophy: Secondary | ICD-10-CM | POA: Diagnosis not present

## 2022-11-20 DIAGNOSIS — I714 Abdominal aortic aneurysm, without rupture, unspecified: Secondary | ICD-10-CM | POA: Diagnosis not present

## 2022-11-20 DIAGNOSIS — H547 Unspecified visual loss: Secondary | ICD-10-CM | POA: Diagnosis not present

## 2022-11-20 DIAGNOSIS — N400 Enlarged prostate without lower urinary tract symptoms: Secondary | ICD-10-CM | POA: Diagnosis not present

## 2022-11-20 DIAGNOSIS — E785 Hyperlipidemia, unspecified: Secondary | ICD-10-CM | POA: Diagnosis not present

## 2022-11-20 DIAGNOSIS — F32A Depression, unspecified: Secondary | ICD-10-CM | POA: Diagnosis not present

## 2022-11-20 DIAGNOSIS — I2510 Atherosclerotic heart disease of native coronary artery without angina pectoris: Secondary | ICD-10-CM | POA: Diagnosis not present

## 2022-11-20 DIAGNOSIS — R32 Unspecified urinary incontinence: Secondary | ICD-10-CM | POA: Diagnosis not present

## 2022-11-20 DIAGNOSIS — H409 Unspecified glaucoma: Secondary | ICD-10-CM | POA: Diagnosis not present

## 2022-11-20 DIAGNOSIS — F419 Anxiety disorder, unspecified: Secondary | ICD-10-CM | POA: Diagnosis not present

## 2022-11-20 DIAGNOSIS — G4733 Obstructive sleep apnea (adult) (pediatric): Secondary | ICD-10-CM | POA: Diagnosis not present

## 2022-11-26 ENCOUNTER — Ambulatory Visit: Payer: PPO

## 2022-11-29 DIAGNOSIS — K219 Gastro-esophageal reflux disease without esophagitis: Secondary | ICD-10-CM | POA: Diagnosis not present

## 2022-11-29 DIAGNOSIS — R2681 Unsteadiness on feet: Secondary | ICD-10-CM | POA: Diagnosis not present

## 2022-11-29 DIAGNOSIS — J449 Chronic obstructive pulmonary disease, unspecified: Secondary | ICD-10-CM | POA: Diagnosis not present

## 2022-11-29 DIAGNOSIS — R531 Weakness: Secondary | ICD-10-CM | POA: Diagnosis not present

## 2022-11-29 DIAGNOSIS — I509 Heart failure, unspecified: Secondary | ICD-10-CM | POA: Diagnosis not present

## 2022-12-03 ENCOUNTER — Other Ambulatory Visit: Payer: Self-pay | Admitting: *Deleted

## 2022-12-03 DIAGNOSIS — I739 Peripheral vascular disease, unspecified: Secondary | ICD-10-CM

## 2022-12-03 DIAGNOSIS — I714 Abdominal aortic aneurysm, without rupture, unspecified: Secondary | ICD-10-CM

## 2022-12-03 DIAGNOSIS — I6523 Occlusion and stenosis of bilateral carotid arteries: Secondary | ICD-10-CM

## 2022-12-07 ENCOUNTER — Other Ambulatory Visit: Payer: Self-pay

## 2022-12-07 ENCOUNTER — Emergency Department (HOSPITAL_COMMUNITY): Payer: PPO

## 2022-12-07 ENCOUNTER — Emergency Department (HOSPITAL_COMMUNITY)
Admission: EM | Admit: 2022-12-07 | Discharge: 2022-12-07 | Disposition: A | Discharge status: Home / Self Care | Payer: PPO | Attending: Emergency Medicine | Admitting: Emergency Medicine

## 2022-12-07 DIAGNOSIS — W0110XA Fall on same level from slipping, tripping and stumbling with subsequent striking against unspecified object, initial encounter: Secondary | ICD-10-CM | POA: Diagnosis not present

## 2022-12-07 DIAGNOSIS — Z7901 Long term (current) use of anticoagulants: Secondary | ICD-10-CM | POA: Insufficient documentation

## 2022-12-07 DIAGNOSIS — S0990XA Unspecified injury of head, initial encounter: Secondary | ICD-10-CM | POA: Diagnosis not present

## 2022-12-07 DIAGNOSIS — W19XXXA Unspecified fall, initial encounter: Secondary | ICD-10-CM | POA: Diagnosis not present

## 2022-12-07 DIAGNOSIS — Y9389 Activity, other specified: Secondary | ICD-10-CM | POA: Insufficient documentation

## 2022-12-07 DIAGNOSIS — R519 Headache, unspecified: Secondary | ICD-10-CM | POA: Diagnosis not present

## 2022-12-07 DIAGNOSIS — M542 Cervicalgia: Secondary | ICD-10-CM | POA: Diagnosis not present

## 2022-12-07 DIAGNOSIS — Z043 Encounter for examination and observation following other accident: Secondary | ICD-10-CM | POA: Diagnosis not present

## 2022-12-07 DIAGNOSIS — J90 Pleural effusion, not elsewhere classified: Secondary | ICD-10-CM | POA: Diagnosis not present

## 2022-12-07 DIAGNOSIS — I10 Essential (primary) hypertension: Secondary | ICD-10-CM | POA: Diagnosis not present

## 2022-12-07 MED ORDER — ACETAMINOPHEN 325 MG PO TABS
650.0000 mg | ORAL_TABLET | Freq: Once | ORAL | Status: AC
  Administered 2022-12-07: 650 mg via ORAL
  Filled 2022-12-07: qty 2

## 2022-12-07 NOTE — ED Notes (Signed)
Patient transported to CT 

## 2022-12-07 NOTE — ED Notes (Signed)
Small skin tear on left elbow bandaged.

## 2022-12-07 NOTE — ED Triage Notes (Signed)
Pt arrived via [facility] for cc of fall, level 2 on thinners (eliquis) from [facility]. Pt suffered mechanical fall attempting to sit in wheelchair, witnessed by staff, striking head on carpet floor. No LOC, ccollar in place. Pt reporting head and neck pain, mostly stiffness reported. GCS 15, A&Ox4 for EMS.   PTA EMS Vitals BP 160/72 HR 74 SPO2 95% RR 18 CBG 215

## 2022-12-07 NOTE — ED Notes (Signed)
Trauma Response Nurse Documentation  Troy Blocker [name] is a 82 y.o. male arriving to [facility] ED via EMS  On Eliquis (apixaban) daily. Trauma was activated as a Level 2 based on the following trauma criteria Elderly patients > 65 with head trauma on anti-coagulation (excluding ASA). Trauma team at the bedside on patient arrival.   Pt transported to CT with trauma response nurse present to monitor. RN remained with the patient throughout their absence from the department for clinical observation. GCS 15.  History   Past Medical History:  Diagnosis Date   AAA (abdominal aortic aneurysm) ([place]) 10-20-17   MONITORED BY DR [name], 3.7 CM by ABDOMINAL US 11/2021   Anxiety    Arthritis    Atrial fibrillation (HCC)    Basal cell carcinoma    BPH (benign prostatic hypertrophy)    Chronic diastolic heart failure (HCC)    Chronic kidney disease    stage 3, pt unaware   Colon cancer (HCC)    COPD (chronic obstructive pulmonary disease) (HCC)    Depression    GERD (gastroesophageal reflux disease)    Glaucoma BOTH EYES   Dr Gershon Crane   History of lung cancer APRIL 2012   ONCOLOGIST- DR Tressie Stalker  LOV IN [facility] 10-27-2012 SQUAMOUS CELL---- S/P RIGHT LOWER LOBECTOMY AT [facility] -- NO CHEMORADIATION--- NO RECURRENCE   Hx of adenomatous colonic polyps 2005    X 2; 1 hyperplastic polyp; Dr Olevia Perches   Hyperlipidemia    Hypertension    OSA (obstructive sleep apnea) 08/29/2015   CPAP SET ON 10   Peripheral vascular disease ([place])    FOLLOWED  BY DR DICKSON   Spinal stenosis 06/2014   Thoracic aorta atherosclerosis (HCC)    Thyrotoxicosis    amiodarone induced     Past Surgical History:  Procedure Laterality Date   ANGIO PLASTY     X 4 in legs   AORTOGRAM  07-27-2002   MILD DIFFUSE ILIAC ARTERY OCCLUSIVE DISEASE /  LEFT RENAL ARTERY 20%/ PATENT LEFT FEM-POP GRAFT/ MILD SFA AND POPLITEAL ARTERY OCCLUSIVE DISEASE W/ SEVERE KIDNEY OCCLUSIVE DISEASE   ATRIAL FIBRILLATION ABLATION N/A 04/09/2019    Procedure: ATRIAL FIBRILLATION ABLATION;  Surgeon: Thompson Grayer, MD;  Location: [place] [facility];  Service: Cardiovascular;  Laterality: N/A;   ATRIAL FIBRILLATION ABLATION N/A 02/12/2020   Procedure: ATRIAL FIBRILLATION ABLATION;  Surgeon: Thompson Grayer, MD;  Location: [place] [facility];  Service: Cardiovascular;  Laterality: N/A;   BALLOON DILATION N/A 02/06/2022   Procedure: BALLOON Tamponade ampulla;  Surgeon: Irving Copas., MD;  Location: [name];  Service: Gastroenterology;  Laterality: N/A;   BASAL CELL CARCINOMA EXCISION     MULTIPLE TIMES--  RIGHT FOREARM, CHEEKS, AND BACK   BIOPSY  06/25/2018   Procedure: BIOPSY;  Surgeon: Rush Landmark Telford Nab., MD;  Location: [place];  Service: Gastroenterology;;   BIOPSY  02/06/2022   Procedure: BIOPSY;  Surgeon: Irving Copas., MD;  Location: [facility];  Service: Gastroenterology;;   CARDIOVASCULAR STRESS TEST  12-08-2012  DR [place] WITH NO EXERCISE NUCLEAR STUDY/ EF 66%/   NO ISCHEMIA/ NO SIGNIFICANT CHANGE FROM PRIOR STUDY   CARDIOVERSION N/A 02/14/2018   Procedure: CARDIOVERSION;  Surgeon: Larey Dresser, MD;  Location[facility] ENDOSCOPY;  Service: Cardiovascular;  Laterality: N/A;   CARDIOVERSION N/A 07/10/2022   Procedure: CARDIOVERSION;  Surgeon: Larey Dresser, MD;  Location: [name];  Service: Cardiovascular;  Laterality: N/A;   CAROTID ANGIOGRAM N/A 07/10/2013  Procedure: CAROTID ANGIOGRAM;  Surgeon: Elam Dutch, MD;  Location: [facility] CATH LAB;  Service: Cardiovascular;  Laterality: N/A;   CAROTID ENDARTERECTOMY Right 07-14-13   cea   CATARACT EXTRACTION W/ INTRAOCULAR LENS  IMPLANT, BILATERAL     colon polyectomy     COLONOSCOPY     COLONOSCOPY WITH PROPOFOL N/A 06/25/2018   Procedure: COLONOSCOPY WITH PROPOFOL;  Surgeon: Rush Landmark Telford Nab., MD;  Location: [place];  Service: Gastroenterology;  Laterality: N/A;   CYSTOSCOPY W/ RETROGRADES Bilateral 01/21/2013   Procedure:  CYSTOSCOPY WITH RETROGRADE PYELOGRAM;  Surgeon: Molli Hazard, MD;  Location: [facility];  Service: Urology;  Laterality: Bilateral;   CYSTO, BLADDER BIOPSY, BILATERAL RETROGRADE PYELOGRAM  RAD TECH FROM RADIOLOGY PER JOY   CYSTOSCOPY WITH BIOPSY N/A 01/21/2013   Procedure: CYSTOSCOPY WITH BIOPSY;  Surgeon: Molli Hazard, MD;  Location: [facility];  Service: Urology;  Laterality: N/A;   ENDARTERECTOMY Right 07/14/2013   Procedure: ENDARTERECTOMY CAROTID;  Surgeon: Angelia Mould, MD;  Location: [facility] OR;  Service: Vascular;  Laterality: Right;   ENDOSCOPIC RETROGRADE CHOLANGIOPANCREATOGRAPHY (ERCP) WITH PROPOFOL N/A 02/06/2022   Procedure: ENDOSCOPIC RETROGRADE CHOLANGIOPANCREATOGRAPHY (ERCP) WITH PROPOFOL;  Surgeon: Irving Copas., MD;  Location: [name];  Service: Gastroenterology;  Laterality: N/A;   EP IMPLANTABLE DEVICE N/A 08/12/2015   Procedure: Loop Recorder Insertion;  Surgeon: Thompson Grayer, MD;  Location: [place] [facility];  Service: Cardiovascular;  Laterality: N/A;   ESOPHAGOGASTRODUODENOSCOPY Left 02/13/2022   Procedure: ESOPHAGOGASTRODUODENOSCOPY (EGD);  Surgeon: Lavena Bullion, DO;  Location: [facility] ENDOSCOPY;  Service: Gastroenterology;  Laterality: Left;   ESOPHAGOGASTRODUODENOSCOPY (EGD) WITH PROPOFOL N/A 06/25/2018   Procedure: ESOPHAGOGASTRODUODENOSCOPY (EGD) WITH PROPOFOL;  Surgeon: Rush Landmark Telford Nab., MD;  Location: [place];  Service: Gastroenterology;  Laterality: N/A;   ESOPHAGOGASTRODUODENOSCOPY (EGD) WITH PROPOFOL N/A 07/10/2018   Procedure: ESOPHAGOGASTRODUODENOSCOPY (EGD) WITH PROPOFOL;  Surgeon: Milus Banister, MD;  [facility];  Service: Endoscopy;  Laterality: N/A;   EUS N/A 07/10/2018   Procedure: UPPER ENDOSCOPIC ULTRASOUND (EUS) RADIAL;  Surgeon: Milus Banister, MD;  Location: [facility];  Service: Endoscopy;  Laterality: N/A;   EYE SURGERY Right    FEMORAL-POPLITEAL BYPASS  GRAFT Left [place]   AND 2001  IN [place]   FEMORAL-POPLITEAL BYPASS GRAFT Left 08/30/2015   Procedure: REVISION OF BYPASS GRAFT Left  FEMORAL-POPLITEAL ARTERY;  Surgeon: Angelia Mould, MD;  Location: [place];  Service: Vascular;  Laterality: Left;   FINE NEEDLE ASPIRATION N/A 07/10/2018   Procedure: FINE NEEDLE ASPIRATION (FNA) LINEAR;  Surgeon: Milus Banister, MD;  Location: [facility];  Service: Endoscopy;  Laterality: N/A;   FLEXIBLE SIGMOIDOSCOPY N/A 12/12/2018   Procedure: FLEXIBLE SIGMOIDOSCOPY;  Surgeon: Ileana Roup, MD;  Location: [facility];  Service: General;  Laterality: N/A;   HEMOSTASIS CLIP PLACEMENT  02/13/2022   Procedure: HEMOSTASIS CLIP PLACEMENT;  Surgeon: Lavena Bullion, DO;  Location: [place];  Service: Gastroenterology;;   HOT HEMOSTASIS N/A 02/13/2022   Procedure: HOT HEMOSTASIS (ARGON PLASMA COAGULATION/BICAP);  Surgeon: Lavena Bullion, DO;  Location: [facility] ENDOSCOPY;  Service: Gastroenterology;  Laterality: N/A;   LARYNGOSCOPY  06-27-2004   BX VOCAL CORD  (LEUKOPLAKIA)  PER PT NO ISSUES SINCE   LEFT HEART CATH AND CORONARY ANGIOGRAPHY N/A 08/20/2017   Procedure: LEFT HEART CATH AND CORONARY ANGIOGRAPHY;  Surgeon: Larey Dresser, MD;  Location: [place] [facility];  Service: Cardiovascular;  Laterality: N/A;   LOOP RECORDER INSERTION  N/A 04/09/2019   Procedure: LOOP RECORDER INSERTION;  Surgeon: Thompson Grayer, MD;  Location: [place] [facility];  Service: Cardiovascular;  Laterality: N/A;   LOOP RECORDER REMOVAL N/A 04/09/2019   Procedure: LOOP RECORDER REMOVAL;  Surgeon: Thompson Grayer, MD;  Location: [place] [facility];  Service: Cardiovascular;  Laterality: N/A;   LOWER EXTREMITY ANGIOGRAM Bilateral 08/29/2015   Procedure: Lower Extremity Angiogram;  Surgeon: Angelia Mould, MD;  Location: [place] [facility];  Service: Cardiovascular;  Laterality: Bilateral;   LUNG LOBECTOMY  01/24/2011    RIGHT UPPER LOBE  (SQUAMOUS CELL CARCINOMA)  Dr Dorthea Cove , [facility]. No chemotherapyor radiation   PANCREATIC STENT PLACEMENT  02/06/2022   Procedure: PANCREATIC STENT PLACEMENT;  Surgeon: Rush Landmark Telford Nab., MD;  Location: [name];  Service: Gastroenterology;;   [facility] ANGIOPLASTY Right 07/14/2013   Procedure: PATCH ANGIOPLASTY;  Surgeon: Angelia Mould, MD;  Location: [place];  Service: Vascular;  Laterality: Right;   PATCH ANGIOPLASTY Left 08/30/2015   Procedure: VEIN PATCH ANGIOPLASTY OF PROXIMAL Left BYPASS GRAFT;  Surgeon: Angelia Mould, MD;  Location: [place];  Service: Vascular;  Laterality: Left;   PERIPHERAL VASCULAR CATHETERIZATION N/A 08/29/2015   Procedure: Abdominal Aortogram;  Surgeon: Angelia Mould, MD;  Location: [place] [facility];  Service: Cardiovascular;  Laterality: N/A;   POLYPECTOMY  06/25/2018   Procedure: POLYPECTOMY;  Surgeon: Rush Landmark Telford Nab., MD;  Location: [place];  Service: Gastroenterology;;   POLYPECTOMY     POLYPECTOMY  02/13/2022   Procedure: POLYPECTOMY;  Surgeon: Lavena Bullion, DO;  Location: [place];  Service: Gastroenterology;;   REMOVAL OF STONES  02/06/2022   Procedure: REMOVAL OF STONES;  Surgeon: Irving Copas., MD;  Location: [name];  Service: Gastroenterology;;   Clide Deutscher  02/13/2022   Procedure: Clide Deutscher;  Surgeon: Lavena Bullion, DO;  Location: [place];  Service: Gastroenterology;;   Joan Mayans  02/06/2022   Procedure: Joan Mayans;  Surgeon: Irving Copas., MD;  Location: [name];  Service: Gastroenterology;;   SUBMUCOSAL INJECTION  06/25/2018   Procedure: SUBMUCOSAL INJECTION;  Surgeon: Irving Copas., MD;  Location: [place];  Service: Gastroenterology;;   TEE WITHOUT CARDIOVERSION N/A 02/14/2018   Procedure: TRANSESOPHAGEAL ECHOCARDIOGRAM (TEE);  Surgeon: Larey Dresser, MD;  Location: [facility] ENDOSCOPY;  Service: Cardiovascular;  Laterality: N/A;   trabecular surgery     OS    TRANSTHORACIC ECHOCARDIOGRAM  12-29-2012  DR [facility]   MILD LVH/  LVSF NORMAL/ EF [id number]  GRADE I DIASTOLIC DYSFUNCTION     Initial Focused Assessment (If applicable, or please see trauma documentation): Patient A&Ox4, GCS 15, PERR 4 C-collar in place by EMS Skin tear left elbow  CT's Completed:   CT Head and CT C-Spine   Interventions:  IV, labs CXR/PXR CT Head/Cspine  Plan for disposition:  Discharge home   Event Summary: Patient to ED from [facility] after falling while trying to get in a wheelchair, witnessed by staff. Takes eliquis for Afib. Pain in head/neck, c-collar placed by EMS. Imaging negative for any acute injury. Patient discharged back to [facility].  Bedside handoff with ED RN [place].    Park Pope Angellica Maddison  Trauma Response RN  Please call TRN at [phone] for further assistance.

## 2022-12-07 NOTE — ED Provider Notes (Signed)
[facility] Provider Note   CSN: [id number] Arrival date & time: 12/07/22  1554     History  Chief Complaint  Patient presents with   Troy Cooler [name] is a 82 y.o. male.  82 year old male brought in by EMS from facility.  Patient is on Eliquis, was sitting down on his rollator when the rollator broke and he fell hitting the back of his head on the carpeted floor.  No loss of consciousness.  Denies any acute injuries.       Home Medications Prior to Admission medications   Medication Sig Start Date End Date Taking? Authorizing Provider  acetaminophen (TYLENOL) 325 MG tablet Take 650 mg by mouth every 6 (six) hours as needed for moderate pain or headache.    [provider]  albuterol (VENTOLIN HFA) 108 (90 Base) MCG/ACT inhaler Inhale 2 puffs into the lungs every 6 (six) hours as needed for wheezing or shortness of breath. 07/06/21   Ghimire, Henreitta Leber, MD  amLODipine (NORVASC) 10 MG tablet Take 1 tablet (10 mg total) by mouth daily. 08/14/22   Larey Dresser, MD  apixaban (ELIQUIS) 2.5 MG TABS tablet Take 1 tablet (2.5 mg total) by mouth 2 (two) times daily. 08/29/22 10/16/22  Elodia Florence., MD  Brinzolamide-Brimonidine [facility]) 1-0.2 % SUSP Place 1 drop into both eyes 3 (three) times daily.    [provider]  Budeson-Glycopyrrol-Formoterol (BREZTRI AEROSPHERE) 160-9-4.8 MCG/ACT AERO Inhale 2 puffs into the lungs in the morning and at bedtime. Patient taking differently: Inhale 2 puffs into the lungs daily. 07/27/21   Icard, Octavio Graves, DO  docusate sodium (COLACE) 100 MG capsule Take 100 mg by mouth 2 (two) times daily as needed (constipation).    [provider]  escitalopram (LEXAPRO) 10 MG tablet TAKE ONE TABLET BY MOUTH EVERY DAY Patient taking differently: Take 10 mg by mouth daily. 08/20/22   Binnie Rail, MD  furosemide (LASIX) 20 MG tablet Take 4 tablets (80 mg total) by  mouth 2 (two) times daily. 08/29/22   Elodia Florence., MD  JARDIANCE 10 MG TABS tablet TAKE ONE TABLET BY MOUTH EVERY DAY BEFORE BREAKFAST. Patient taking differently: Take 10 mg by mouth daily. 01/08/22   Larey Dresser, MD  latanoprost (XALATAN) 0.005 % ophthalmic solution Place 1 drop into both eyes at bedtime.    [provider]  Melatonin 10 MG TABS Take 20 mg by mouth at bedtime.    [provider]  metoprolol succinate (TOPROL-XL) 25 MG 24 hr tablet Take 1 tablet (25 mg total) by mouth daily. Patient taking differently: Take 25 mg by mouth at bedtime. 07/31/22   Milford, Maricela Bo, FNP  montelukast (SINGULAIR) 10 MG tablet TAKE ONE TABLET BY MOUTH AT BEDTIME Patient taking differently: Take 10 mg by mouth at bedtime. 08/20/22   Binnie Rail, MD  omeprazole (PRILOSEC) 20 MG capsule Take 1 capsule (20 mg total) by mouth 2 (two) times daily. 05/07/22   Mansouraty, Telford Nab., MD  OVER THE COUNTER MEDICATION Take 3 capsules by mouth daily. Balance of [employer], Historical, MD  OVER THE COUNTER MEDICATION Take 3 capsules by mouth daily. Balance of [employer], Historical, MD  Polyethyl Glycol-Propyl Glycol (SYSTANE ULTRA) 0.4-0.3 % SOLN Place 1 drop into both eyes daily as needed (dry eyes).    [provider]  potassium chloride SA (  KLOR-CON M) 20 MEQ tablet TAKE TWO TABLETS BY MOUTH IN THE MORNING AND TAKE ONE TABLET IN THE EVENING Patient taking differently: Take 20-40 mEq by mouth See admin instructions. 40 mEq in the morning, 20 mEq in the evening 05/10/22   Larey Dresser, MD  Probiotic Product (DIGESTIVE ADVANTAGE) CAPS Take 1 capsule by mouth daily.    [provider]  rosuvastatin (CRESTOR) 40 MG tablet TAKE ONE TABLET BY MOUTH EVERY DAY Patient taking differently: Take 40 mg by mouth daily. 02/09/22   Larey Dresser, MD  spironolactone (ALDACTONE) 25 MG tablet Take 1 tablet (25 mg total) by mouth daily.  08/30/22 09/29/22  Elodia Florence., MD      Allergies    Niacin-lovastatin er, Penicillins, Sulfonamide derivatives, and Lipitor [atorvastatin]    Review of Systems   Review of Systems Negative except as per HPI Physical Exam Updated Vital Signs BP (!) 144/67   Pulse 60   Temp 98.2 F (36.8 C) (Oral)   Resp 17   Ht '6\' 1"'$  (1.854 m)   Wt 82 kg   SpO2 96%   BMI 23.85 kg/m  Physical Exam Vitals and nursing note reviewed.  Constitutional:      General: He is not in acute distress.    Appearance: He is well-developed. He is not diaphoretic.     Interventions: Cervical collar in place.  HENT:     Head: Normocephalic and atraumatic.     Nose: Nose normal.     Mouth/Throat:     Mouth: Mucous membranes are moist.  Eyes:     Extraocular Movements: Extraocular movements intact.     Pupils: Pupils are equal, round, and reactive to light.  Cardiovascular:     Rate and Rhythm: Normal rate and regular rhythm.     Pulses: Normal pulses.     Heart sounds: Normal heart sounds.  Pulmonary:     Effort: Pulmonary effort is normal.  Musculoskeletal:        General: No swelling, tenderness, deformity or signs of injury. Normal range of motion.     Cervical back: Neck supple. No tenderness or bony tenderness.     Thoracic back: No tenderness or bony tenderness.     Lumbar back: No tenderness or bony tenderness.  Skin:    General: Skin is warm and dry.  Neurological:     Mental Status: He is alert and oriented to person, place, and time.  Psychiatric:        Behavior: Behavior normal.     ED Results / Procedures / Treatments   Labs (all labs ordered are listed, but only abnormal results are displayed) Labs Reviewed - No data to display  EKG None  Radiology CT Head Wo Contrast  Result Date: 12/07/2022 CLINICAL DATA:  82 year old male with head and neck injury from fall. Initial encounter. EXAM: CT HEAD WITHOUT CONTRAST CT CERVICAL SPINE WITHOUT CONTRAST TECHNIQUE:  Multidetector CT imaging of the head and cervical spine was performed following the standard protocol without intravenous contrast. Multiplanar CT image reconstructions of the cervical spine were also generated. RADIATION DOSE REDUCTION: This exam was performed according to the departmental dose-optimization program which includes automated exposure control, adjustment of the mA and/or kV according to patient size and/or use of iterative reconstruction technique. COMPARISON:  02/12/2022 CTs FINDINGS: CT HEAD FINDINGS Brain: No evidence of acute infarction, hemorrhage, hydrocephalus, extra-axial collection or mass lesion/mass effect. Atrophy and chronic small-vessel white matter ischemic changes again noted. Vascular: Carotid  and vertebral atherosclerotic calcifications are noted. Skull: Normal. Negative for fracture or focal lesion. Sinuses/Orbits: No acute finding. Bilateral globe surgical changes noted. Other: None. CT CERVICAL SPINE FINDINGS Alignment: Normal. Skull base and vertebrae: No acute fracture. No primary bone lesion or focal pathologic process. Soft tissues and spinal canal: No prevertebral fluid or swelling. No visible canal hematoma. Disc levels: Multilevel degenerative disc disease, spondylosis and facet arthropathy again noted contributing to bony foraminal narrowing at multiple levels. Upper chest: No acute abnormality. Other: None IMPRESSION: 1. No evidence of acute intracranial abnormality. Atrophy and chronic small-vessel white matter ischemic changes. 2. No static evidence of acute injury to the cervical spine. Multilevel degenerative changes. Electronically Signed   By: Margarette Canada M.D.   On: 12/07/2022 16:57   CT Cervical Spine Wo Contrast  Result Date: 12/07/2022 CLINICAL DATA:  82 year old male with head and neck injury from fall. Initial encounter. EXAM: CT HEAD WITHOUT CONTRAST CT CERVICAL SPINE WITHOUT CONTRAST TECHNIQUE: Multidetector CT imaging of the head and cervical spine was  performed following the standard protocol without intravenous contrast. Multiplanar CT image reconstructions of the cervical spine were also generated. RADIATION DOSE REDUCTION: This exam was performed according to the departmental dose-optimization program which includes automated exposure control, adjustment of the mA and/or kV according to patient size and/or use of iterative reconstruction technique. COMPARISON:  02/12/2022 CTs FINDINGS: CT HEAD FINDINGS Brain: No evidence of acute infarction, hemorrhage, hydrocephalus, extra-axial collection or mass lesion/mass effect. Atrophy and chronic small-vessel white matter ischemic changes again noted. Vascular: Carotid and vertebral atherosclerotic calcifications are noted. Skull: Normal. Negative for fracture or focal lesion. Sinuses/Orbits: No acute finding. Bilateral globe surgical changes noted. Other: None. CT CERVICAL SPINE FINDINGS Alignment: Normal. Skull base and vertebrae: No acute fracture. No primary bone lesion or focal pathologic process. Soft tissues and spinal canal: No prevertebral fluid or swelling. No visible canal hematoma. Disc levels: Multilevel degenerative disc disease, spondylosis and facet arthropathy again noted contributing to bony foraminal narrowing at multiple levels. Upper chest: No acute abnormality. Other: None IMPRESSION: 1. No evidence of acute intracranial abnormality. Atrophy and chronic small-vessel white matter ischemic changes. 2. No static evidence of acute injury to the cervical spine. Multilevel degenerative changes. Electronically Signed   By: Margarette Canada M.D.   On: 12/07/2022 16:57   DG Chest Port 1 View  Result Date: 12/07/2022 CLINICAL DATA:  Fall. EXAM: PORTABLE CHEST 1 VIEW COMPARISON:  August 27, 2022. FINDINGS: Stable cardiomediastinal silhouette. Stable small right pleural effusion is noted with associated right basilar atelectasis or infiltrate. Left lung is unremarkable. Bony thorax unremarkable. IMPRESSION:  Stable small right pleural effusion with associated right basilar atelectasis or infiltrate. Electronically Signed   By: Marijo Conception M.D.   On: 12/07/2022 16:13   DG Pelvis Portable  Result Date: 12/07/2022 CLINICAL DATA:  Fall. EXAM: PORTABLE PELVIS 1-2 VIEWS COMPARISON:  None Available. FINDINGS: There is no evidence of pelvic fracture or diastasis. No pelvic bone lesions are seen. IMPRESSION: Negative. Electronically Signed   By: Marijo Conception M.D.   On: 12/07/2022 16:12    Procedures Procedures    Medications Ordered in ED Medications  acetaminophen (TYLENOL) tablet 650 mg (650 mg Oral Given 12/07/22 1733)    ED Course/ Medical Decision Making/ A&P                             Medical Decision Making Amount and/or Complexity of Data  Reviewed Radiology: ordered.  Risk OTC drugs.   82 year old male on blood thinners presents via EMS from facility after minor fall resulting in hitting his head without LOC.  Exam is unremarkable, patient denies complaints.  Portable chest and pelvis x-rays as ordered and read by self are negative for acute bony injury, agree with radiologist interpretation.  CT of the head and C-spine are negative for acute intracranial injury, negative for C-spine injury.  Patient is discharged back to facility.        Final Clinical Impression(s) / ED Diagnoses Final diagnoses:  Fall, initial encounter    Rx / DC Orders ED Discharge Orders     None         Tacy Learn, PA-C 12/07/22 2135    Lorelle Gibbs, DO 12/08/22 1522

## 2022-12-10 NOTE — Progress Notes (Signed)
Carelink Summary Report / Loop Recorder 

## 2022-12-11 ENCOUNTER — Telehealth: Payer: Self-pay | Admitting: Internal Medicine

## 2022-12-11 NOTE — Telephone Encounter (Signed)
Aaron Edelman with [facility] called and states that patient missed his last re certification appointment. He needs an order to restart home health with physical therapy. Appointment is scheduled for 12/12/22. Brian's call back number is [phone]

## 2022-12-11 NOTE — Telephone Encounter (Signed)
Okay for orders? 

## 2022-12-11 NOTE — Telephone Encounter (Signed)
Verbals left today for [employer].

## 2022-12-12 DIAGNOSIS — Z7901 Long term (current) use of anticoagulants: Secondary | ICD-10-CM | POA: Diagnosis not present

## 2022-12-12 DIAGNOSIS — I509 Heart failure, unspecified: Secondary | ICD-10-CM | POA: Diagnosis not present

## 2022-12-12 DIAGNOSIS — Z9181 History of falling: Secondary | ICD-10-CM | POA: Diagnosis not present

## 2022-12-12 DIAGNOSIS — Z7984 Long term (current) use of oral hypoglycemic drugs: Secondary | ICD-10-CM | POA: Diagnosis not present

## 2022-12-12 DIAGNOSIS — J449 Chronic obstructive pulmonary disease, unspecified: Secondary | ICD-10-CM | POA: Diagnosis not present

## 2022-12-12 DIAGNOSIS — E1122 Type 2 diabetes mellitus with diabetic chronic kidney disease: Secondary | ICD-10-CM | POA: Diagnosis not present

## 2022-12-12 DIAGNOSIS — N189 Chronic kidney disease, unspecified: Secondary | ICD-10-CM | POA: Diagnosis not present

## 2022-12-12 DIAGNOSIS — H547 Unspecified visual loss: Secondary | ICD-10-CM | POA: Diagnosis not present

## 2022-12-12 DIAGNOSIS — I4891 Unspecified atrial fibrillation: Secondary | ICD-10-CM | POA: Diagnosis not present

## 2022-12-12 DIAGNOSIS — H409 Unspecified glaucoma: Secondary | ICD-10-CM | POA: Diagnosis not present

## 2022-12-12 DIAGNOSIS — Z85038 Personal history of other malignant neoplasm of large intestine: Secondary | ICD-10-CM | POA: Diagnosis not present

## 2022-12-13 ENCOUNTER — Ambulatory Visit (HOSPITAL_COMMUNITY)
Admission: RE | Admit: 2022-12-13 | Discharge: 2022-12-13 | Disposition: A | Discharge status: Home / Self Care | Payer: PPO | Source: Ambulatory Visit | Attending: Vascular Surgery | Admitting: Vascular Surgery

## 2022-12-13 ENCOUNTER — Ambulatory Visit (INDEPENDENT_AMBULATORY_CARE_PROVIDER_SITE_OTHER)
Admission: RE | Admit: 2022-12-13 | Discharge: 2022-12-13 | Disposition: A | Discharge status: Home / Self Care | Payer: PPO | Source: Ambulatory Visit | Attending: Vascular Surgery | Admitting: Vascular Surgery

## 2022-12-13 ENCOUNTER — Ambulatory Visit (INDEPENDENT_AMBULATORY_CARE_PROVIDER_SITE_OTHER): Payer: PPO | Admitting: Physician Assistant

## 2022-12-13 ENCOUNTER — Telehealth: Payer: Self-pay | Admitting: Internal Medicine

## 2022-12-13 VITALS — BP 176/78 | HR 84 | Temp 97.9°F | Resp 20 | Ht 73.0 in | Wt 178.0 lb

## 2022-12-13 DIAGNOSIS — I6523 Occlusion and stenosis of bilateral carotid arteries: Secondary | ICD-10-CM | POA: Insufficient documentation

## 2022-12-13 DIAGNOSIS — I714 Abdominal aortic aneurysm, without rupture, unspecified: Secondary | ICD-10-CM

## 2022-12-13 DIAGNOSIS — I739 Peripheral vascular disease, unspecified: Secondary | ICD-10-CM | POA: Insufficient documentation

## 2022-12-13 NOTE — Progress Notes (Signed)
[facility] [name] HISTORY AND PHYSICAL   History of Present Illness:  Patient is a 82 y.o. year old male who presents for evaluation of abdominal aortic aneurysm.  Last largest diameter measured was 3.7 cm. He denise lumbar/abdominal pain out of the ordinary.    He is s/p left fem-pop bypass graft in [place] in 1994. Dr. [name] revised this in 2001. He had angioplasty of this in 2016. His bypass has been patent without any recurrent stenosis on surveillance.   He denise non healing wounds and rest pain.  He is not very ambulatory.  The mode of transportation is WC.    He is s/p right carotid endarterectomy in October of 2014. He denies any TIA or stroke like symptoms. He has not had any headaches, dizziness, facial drooping, slurred speech, weakness or numbness of upper or lower extremities    The pt is on a statin for cholesterol management.  The pt is not on a daily aspirin.   Other AC:  Eliquis (a. Fib) The pt is on amlodipine for hypertension.   The pt is not diabetic.   Tobacco hx:  Former smoker, quit 2015    Past Medical History:  Diagnosis Date   AAA (abdominal aortic aneurysm) ([place]) 10-20-17   MONITORED BY DR [name], 3.7 CM by ABDOMINAL US 11/2021   Anxiety    Arthritis    Atrial fibrillation (HCC)    Basal cell carcinoma    BPH (benign prostatic hypertrophy)    Chronic diastolic heart failure (HCC)    Chronic kidney disease    stage 3, pt unaware   Colon cancer (HCC)    COPD (chronic obstructive pulmonary disease) (HCC)    Depression    GERD (gastroesophageal reflux disease)    Glaucoma BOTH EYES   Dr Gershon Crane   History of lung cancer APRIL 2012   ONCOLOGIST- DR Tressie Stalker  LOV IN [facility] 10-27-2012 SQUAMOUS CELL---- S/P RIGHT LOWER LOBECTOMY AT [facility] -- NO CHEMORADIATION--- NO RECURRENCE   Hx of adenomatous colonic polyps 2005    X 2; 1 hyperplastic polyp; Dr Olevia Perches   Hyperlipidemia    Hypertension    OSA (obstructive sleep apnea) 08/29/2015    CPAP SET ON 10   Peripheral vascular disease ([place])    FOLLOWED  BY DR DICKSON   Spinal stenosis 06/2014   Thoracic aorta atherosclerosis (HCC)    Thyrotoxicosis    amiodarone induced    Past Surgical History:  Procedure Laterality Date   ANGIO PLASTY     X 4 in legs   AORTOGRAM  07-27-2002   MILD DIFFUSE ILIAC ARTERY OCCLUSIVE DISEASE /  LEFT RENAL ARTERY 20%/ PATENT LEFT FEM-POP GRAFT/ MILD SFA AND POPLITEAL ARTERY OCCLUSIVE DISEASE W/ SEVERE KIDNEY OCCLUSIVE DISEASE   ATRIAL FIBRILLATION ABLATION N/A 04/09/2019   Procedure: ATRIAL FIBRILLATION ABLATION;  Surgeon: Thompson Grayer, MD;  Location: [place] [facility];  Service: Cardiovascular;  Laterality: N/A;   ATRIAL FIBRILLATION ABLATION N/A 02/12/2020   Procedure: ATRIAL FIBRILLATION ABLATION;  Surgeon: Thompson Grayer, MD;  Location: [place] [facility];  Service: Cardiovascular;  Laterality: N/A;   BALLOON DILATION N/A 02/06/2022   Procedure: BALLOON Tamponade ampulla;  Surgeon: Irving Copas., MD;  Location: [name];  Service: Gastroenterology;  Laterality: N/A;   BASAL CELL CARCINOMA EXCISION     MULTIPLE TIMES--  RIGHT FOREARM, CHEEKS, AND BACK   BIOPSY  06/25/2018   Procedure: BIOPSY;  Surgeon: Rush Landmark Telford Nab., MD;  Location: [place];  Service: Gastroenterology;;   BIOPSY  02/06/2022   Procedure: BIOPSY;  Surgeon: Irving Copas., MD;  Location: [name];  Service: Gastroenterology;;   CARDIOVASCULAR STRESS TEST  12-08-2012  [facility]   NORMAL LEXISCAN WITH NO EXERCISE NUCLEAR STUDY/ EF 66%/   NO ISCHEMIA/ NO SIGNIFICANT CHANGE FROM PRIOR STUDY   CARDIOVERSION N/A 02/14/2018   Procedure: CARDIOVERSION;  Surgeon: Larey Dresser, MD;  Location: [facility] ENDOSCOPY;  Service: Cardiovascular;  Laterality: N/A;   CARDIOVERSION N/A 07/10/2022   Procedure: CARDIOVERSION;  Surgeon: Larey Dresser, MD;  Location: [facility]OSCOPY;  Service: Cardiovascular;  Laterality: N/A;   CAROTID ANGIOGRAM N/A 07/10/2013    Procedure: CAROTID ANGIOGRAM;  Surgeon: Elam Dutch, MD;  Location: [facility] CATH LAB;  Service: Cardiovascular;  Laterality: N/A;   CAROTID ENDARTERECTOMY Right 07-14-13   cea   CATARACT EXTRACTION W/ INTRAOCULAR LENS  IMPLANT, BILATERAL     colon polyectomy     COLONOSCOPY     COLONOSCOPY WITH PROPOFOL N/A 06/25/2018   Procedure: COLONOSCOPY WITH PROPOFOL;  Surgeon: Rush Landmark Telford Nab., MD;  Location: [place];  Service: Gastroenterology;  Laterality: N/A;   CYSTOSCOPY W/ RETROGRADES Bilateral 01/21/2013   Procedure: CYSTOSCOPY WITH RETROGRADE PYELOGRAM;  Surgeon: Molli Hazard, MD;  Location: [facility];  Service: Urology;  Laterality: Bilateral;   CYSTO, BLADDER BIOPSY, BILATERAL RETROGRADE PYELOGRAM  RAD TECH FROM RADIOLOGY PER JOY   CYSTOSCOPY WITH BIOPSY N/A 01/21/2013   Procedure: CYSTOSCOPY WITH BIOPSY;  Surgeon: Molli Hazard, MD;  Location: [facility];  Service: Urology;  Laterality: N/A;   ENDARTERECTOMY Right 07/14/2013   Procedure: ENDARTERECTOMY CAROTID;  Surgeon: Angelia Mould, MD;  Location: [facility] OR;  Service: Vascular;  Laterality: Right;   ENDOSCOPIC RETROGRADE CHOLANGIOPANCREATOGRAPHY (ERCP) WITH PROPOFOL N/A 02/06/2022   Procedure: ENDOSCOPIC RETROGRADE CHOLANGIOPANCREATOGRAPHY (ERCP) WITH PROPOFOL;  Surgeon: Irving Copas., MD;  Location: [name];  Service: Gastroenterology;  Laterality: N/A;   EP IMPLANTABLE DEVICE N/A 08/12/2015   Procedure: Loop Recorder Insertion;  Surgeon: Thompson Grayer, MD;  Location: [place] [facility];  Service: Cardiovascular;  Laterality: N/A;   ESOPHAGOGASTRODUODENOSCOPY Left 02/13/2022   Procedure: ESOPHAGOGASTRODUODENOSCOPY (EGD);  Surgeon: Lavena Bullion, DO;  Location: [facility] ENDOSCOPY;  Service: Gastroenterology;  Laterality: Left;   ESOPHAGOGASTRODUODENOSCOPY (EGD) WITH PROPOFOL N/A 06/25/2018   Procedure: ESOPHAGOGASTRODUODENOSCOPY (EGD) WITH PROPOFOL;  Surgeon:  Rush Landmark Telford Nab., MD;  Location: [place];  Service: Gastroenterology;  Laterality: N/A;   ESOPHAGOGASTRODUODENOSCOPY (EGD) WITH PROPOFOL N/A 07/10/2018   Procedure: ESOPHAGOGASTRODUODENOSCOPY (EGD) WITH PROPOFOL;  Surgeon: Milus Banister, MD;  Location: [facility] ENDOSCOPY;  Service: Endoscopy;  Laterality: N/A;   EUS N/A 07/10/2018   Procedure: UPPER ENDOSCOPIC ULTRASOUND (EUS) RADIAL;  Surgeon: Milus Banister, MD;  Location: [facility] ENDOSCOPY;  Service: Endoscopy;  Laterality: N/A;   EYE SURGERY Right    FEMORAL-POPLITEAL BYPASS GRAFT Left [place]   AND 2001  IN [place]   FEMORAL-POPLITEAL BYPASS GRAFT Left 08/30/2015   Procedure: REVISION OF BYPASS GRAFT Left  FEMORAL-POPLITEAL ARTERY;  Surgeon: Angelia Mould, MD;  Location: [place];  Service: Vascular;  Laterality: Left;   FINE NEEDLE ASPIRATION N/A 07/10/2018   Procedure: FINE NEEDLE ASPIRATION (FNA) LINEAR;  Surgeon: Milus Banister, MD;  Location: [facility]OS[facility];  Service: Endoscopy;  Laterality: N/A;   FLEXIBLE SIGMOIDOSCOPY N/A 12/12/2018   Procedure: FLEXIBLE SIGMOIDOSCOPY;  Surgeon: Ileana Roup, MD;  Location: [facility] ORS;  Service: General;  Laterality: N/A;   HEMOSTASIS  CLIP PLACEMENT  02/13/2022   Procedure: HEMOSTASIS CLIP PLACEMENT;  Surgeon: Lavena Bullion, DO;  Location: [place];  Service: Gastroenterology;;   HOT HEMOSTASIS N/A 02/13/2022   Procedure: HOT HEMOSTASIS (ARGON PLASMA COAGULATION/BICAP);  Surgeon: Lavena Bullion, DO;  Location: [facility] ENDOSCOPY;  Service: Gastroenterology;  Laterality: N/A;   LARYNGOSCOPY  06-27-2004   BX VOCAL CORD  (LEUKOPLAKIA)  PER PT NO ISSUES SINCE   LEFT HEART CATH AND CORONARY ANGIOGRAPHY N/A 08/20/2017   Procedure: LEFT HEART CATH AND CORONARY ANGIOGRAPHY;  Surgeon: Larey Dresser, MD;  Location: [place] [facility];  Service: Cardiovascular;  Laterality: N/A;   LOOP RECORDER INSERTION N/A 04/09/2019   Procedure: LOOP RECORDER INSERTION;  Surgeon: Thompson Grayer, MD;  Location: [place] [facility];  Service: Cardiovascular;  Laterality: N/A;   LOOP RECORDER REMOVAL N/A 04/09/2019   Procedure: LOOP RECORDER REMOVAL;  Surgeon: Thompson Grayer, MD;  Location: [place] [facility];  Service: Cardiovascular;  Laterality: N/A;   LOWER EXTREMITY ANGIOGRAM Bilateral 08/29/2015   Procedure: Lower Extremity Angiogram;  Surgeon: Angelia Mould, MD;  Location: [place] [facility];  Service: Cardiovascular;  Laterality: Bilateral;   LUNG LOBECTOMY  01/24/2011    RIGHT UPPER LOBE  (SQUAMOUS CELL CARCINOMA) Dr Dorthea Cove , [name]. No chemotherapyor radiation   PANCREATIC STENT PLACEMENT  02/06/2022   Procedure: PANCREATIC STENT PLACEMENT;  Surgeon: Rush Landmark Telford Nab., MD;  Location: [name];  Service: Gastroenterology;;   [facility] ANGIOPLASTY Right 07/14/2013   Procedure: PATCH ANGIOPLASTY;  Surgeon: Angelia Mould, MD;  Location: [place];  Service: Vascular;  Laterality: Right;   PATCH ANGIOPLASTY Left 08/30/2015   Procedure: VEIN PATCH ANGIOPLASTY OF PROXIMAL Left BYPASS GRAFT;  Surgeon: Angelia Mould, MD;  Location: [place];  Service: Vascular;  Laterality: Left;   PERIPHERAL VASCULAR CATHETERIZATION N/A 08/29/2015   Procedure: Abdominal Aortogram;  Surgeon: Angelia Mould, MD;  Location: [place] [facility];  Service: Cardiovascular;  Laterality: N/A;   POLYPECTOMY  06/25/2018   Procedure: POLYPECTOMY;  Surgeon: Rush Landmark Telford Nab., MD;  Location: [place];  Service: Gastroenterology;;   POLYPECTOMY     POLYPECTOMY  02/13/2022   Procedure: POLYPECTOMY;  Surgeon: Lavena Bullion, DO;  Location: [place];  Service: Gastroenterology;;   REMOVAL OF STONES  02/06/2022   Procedure: REMOVAL OF STONES;  Surgeon: Irving Copas., MD;  Location: [name];  Service: Gastroenterology;;   Clide Deutscher  02/13/2022   Procedure: Clide Deutscher;  Surgeon: Lavena Bullion, DO;  Location: [place];  Service: Gastroenterology;;    Joan Mayans  02/06/2022   Procedure: Joan Mayans;  Surgeon: Irving Copas., MD;  Location: [name];  Service: Gastroenterology;;   SUBMUCOSAL INJECTION  06/25/2018   Procedure: SUBMUCOSAL INJECTION;  Surgeon: Irving Copas., MD;  Location: [place];  Service: Gastroenterology;;   TEE WITHOUT CARDIOVERSION N/A 02/14/2018   Procedure: TRANSESOPHAGEAL ECHOCARDIOGRAM (TEE);  Surgeon: Larey Dresser, MD;  Location: [facility] ENDOSCOPY;  Service: Cardiovascular;  Laterality: N/A;   trabecular surgery     OS   TRANSTHORACIC ECHOCARDIOGRAM  12-29-2012  DR [facility]   MILD LVH/  LVSF NORMAL/ EF [id number]  GRADE I DIASTOLIC DYSFUNCTION     Social History Social History   Tobacco Use   Smoking status: Former    Packs/day: 0.50    Years: 50.00    Additional pack years: 0.00    Total pack years: 25.00    Types: Cigarettes    Quit date: 07/28/2014    Years  since quitting: 8.3    Passive exposure: Never   Smokeless tobacco: Never  Vaping Use   Vaping Use: Never used  Substance Use Topics   Alcohol use: Yes    Alcohol/week: 4.0 standard drinks of alcohol    Types: 1 Glasses of wine, 1 Cans of beer, 1 Shots of liquor, 1 Standard drinks or equivalent per week    Comment: socially, variable; moderately heavy use in the past   Drug use: No    Family History Family History  Problem Relation Age of Onset   Stroke Mother        mini strokes   Alcohol abuse Father    Heart disease Father        MI after 39   Stroke Father    Hypertension Father    Heart attack Father    Heart disease Paternal Aunt        several   Hypertension Paternal Aunt        several   Stroke Paternal Aunt        several   Stroke Paternal Uncle        several   Heart disease Paternal Uncle        several;2 had MI pre 46   Cancer Daughter 11       breast ca, also with benign sessile polyp    Colon cancer Daughter 48   Colon polyps Neg Hx    Esophageal cancer Neg Hx    Rectal cancer Neg Hx     Stomach cancer Neg Hx     Allergies  Allergies  Allergen Reactions   Niacin-Lovastatin Er Shortness Of Breath and Other (See Comments)    Dyspnea Flushing   Penicillins Hives, Shortness Of Breath and Other (See Comments)    Dyspnea Flushing Because of a history of documented adverse serious drug reaction;Medi Alert bracelet  is recommended PCN reaction causing immediate rash, facial/tongue/throat swelling, SOB or lightheadedness with hypotension: Yes PCN reaction causing severe rash involving mucus membranes or skin necrosis: No PCN reaction occurring within the last 10 years: NO PCN reaction that required hospitalization: NO   Sulfonamide Derivatives Hives, Shortness Of Breath and Other (See Comments)    Dyspnea  Flushing Because of a history of documented adverse serious drug reaction;Medi Alert bracelet  is recommended   Lipitor [Atorvastatin] Other (See Comments)    Myalgias Arthralgias      Current Outpatient Medications  Medication Sig Dispense Refill   acetaminophen (TYLENOL) 325 MG tablet Take 650 mg by mouth every 6 (six) hours as needed for moderate pain or headache.     albuterol (VENTOLIN HFA) 108 (90 Base) MCG/ACT inhaler Inhale 2 puffs into the lungs every 6 (six) hours as needed for wheezing or shortness of breath. 8.5 g 0   amLODipine (NORVASC) 10 MG tablet Take 1 tablet (10 mg total) by mouth daily. 90 tablet 3   Brinzolamide-Brimonidine (SIMBRINZA) 1-0.2 % SUSP Place 1 drop into both eyes 3 (three) times daily.     Budeson-Glycopyrrol-Formoterol (BREZTRI AEROSPHERE) 160-9-4.8 MCG/ACT AERO Inhale 2 puffs into the lungs in the morning and at bedtime. (Patient taking differently: Inhale 2 puffs into the lungs daily.) 10.7 g 0   docusate sodium (COLACE) 100 MG capsule Take 100 mg by mouth 2 (two) times daily as needed (constipation).     escitalopram (LEXAPRO) 10 MG tablet TAKE ONE TABLET BY MOUTH EVERY DAY (Patient taking differently: Take 10 mg by mouth daily.)  28 tablet 5  furosemide (LASIX) 20 MG tablet Take 4 tablets (80 mg total) by mouth 2 (two) times daily. 450 tablet 3   JARDIANCE 10 MG TABS tablet TAKE ONE TABLET BY MOUTH EVERY DAY BEFORE BREAKFAST. (Patient taking differently: Take 10 mg by mouth daily.) 30 tablet 11   latanoprost (XALATAN) 0.005 % ophthalmic solution Place 1 drop into both eyes at bedtime.     Melatonin 10 MG TABS Take 20 mg by mouth at bedtime.     metoprolol succinate (TOPROL-XL) 25 MG 24 hr tablet Take 1 tablet (25 mg total) by mouth daily. (Patient taking differently: Take 25 mg by mouth at bedtime.) 90 tablet 3   montelukast (SINGULAIR) 10 MG tablet TAKE ONE TABLET BY MOUTH AT BEDTIME (Patient taking differently: Take 10 mg by mouth at bedtime.) 28 tablet 5   omeprazole (PRILOSEC) 20 MG capsule Take 1 capsule (20 mg total) by mouth 2 (two) times daily. 60 capsule 3   OVER THE COUNTER MEDICATION Take 3 capsules by mouth daily. Balance of Nature Fruits     OVER THE COUNTER MEDICATION Take 3 capsules by mouth daily. Balance of Nature Veggies     Polyethyl Glycol-Propyl Glycol (SYSTANE ULTRA) 0.4-0.3 % SOLN Place 1 drop into both eyes daily as needed (dry eyes).     potassium chloride SA (KLOR-CON M) 20 MEQ tablet TAKE TWO TABLETS BY MOUTH IN THE MORNING AND TAKE ONE TABLET IN THE EVENING (Patient taking differently: Take 20-40 mEq by mouth See admin instructions. 40 mEq in the morning, 20 mEq in the evening) 90 tablet 6   Probiotic Product (DIGESTIVE ADVANTAGE) CAPS Take 1 capsule by mouth daily.     rosuvastatin (CRESTOR) 40 MG tablet TAKE ONE TABLET BY MOUTH EVERY DAY (Patient taking differently: Take 40 mg by mouth daily.) 90 tablet 3   apixaban (ELIQUIS) 2.5 MG TABS tablet Take 1 tablet (2.5 mg total) by mouth 2 (two) times daily. 60 tablet 0   spironolactone (ALDACTONE) 25 MG tablet Take 1 tablet (25 mg total) by mouth daily. 30 tablet 0   No current facility-administered medications for this visit.    ROS:    General:  No weight loss, Fever, chills  HEENT: No recent headaches, no nasal bleeding, no visual changes, no sore throat  Neurologic: No dizziness, blackouts, seizures. No recent symptoms of stroke or mini- stroke. No recent episodes of slurred speech, or temporary blindness.  Cardiac: No recent episodes of chest pain/pressure, no shortness of breath at rest.  No shortness of breath with exertion.  Denies history of atrial fibrillation or irregular heartbeat  Vascular: No history of rest pain in feet.  No history of claudication.  No history of non-healing ulcer, No history of DVT   Pulmonary: No home oxygen, no productive cough, no hemoptysis,  No asthma or wheezing  Musculoskeletal:  '[ ]'$  Arthritis, '[ ]'$  Low back pain,  '[ ]'$  Joint pain  Hematologic:No history of hypercoagulable state.  No history of easy bleeding.  No history of anemia  Gastrointestinal: No hematochezia or melena,  No gastroesophageal reflux, no trouble swallowing  Urinary: [ x] chronic Kidney disease, '[ ]'$  on HD - '[ ]'$  MWF or '[ ]'$  TTHS, '[ ]'$  Burning with urination, '[ ]'$  Frequent urination, '[ ]'$  Difficulty urinating;   Skin: No rashes  Psychological: No history of anxiety,  No history of depression   Physical Examination  Vitals:   12/13/22 1036 12/13/22 1039  BP: (!) 171/73 (!) 176/78  Pulse: 84  Resp: 20   Temp: 97.9 F (36.6 C)   TempSrc: Temporal   SpO2: 95%   Weight: 178 lb (80.7 kg)   Height: '6\' 1"'$  (1.854 m)     Body mass index is 23.48 kg/m.  General:  Alert and oriented, no acute distress HEENT: Normal Neck: No bruit or JVD Pulmonary: Clear to auscultation bilaterally Cardiac: Regular Rate and Rhythm without murmur Gastrointestinal: Soft, non-tender, non-distended, no mass, no scars Skin: No rash Extremity Pulses:   radial,  femoral B  Musculoskeletal: No deformity or edema  Neurologic: Upper and lower extremity motor 5/5 and symmetric  DATA:  ABI Findings:   +---------+------------------+-----+----------+--------+  Right   Rt Pressure (mmHg)IndexWaveform  Comment   +---------+------------------+-----+----------+--------+  Brachial 176                                        +---------+------------------+-----+----------+--------+  PTA     87                0.49 monophasic          +---------+------------------+-----+----------+--------+  DP      103               0.58 monophasic          +---------+------------------+-----+----------+--------+  Great Toe81                0.46                     +---------+------------------+-----+----------+--------+   +---------+------------------+-----+---------+-------+  Left    Lt Pressure (mmHg)IndexWaveform Comment  +---------+------------------+-----+---------+-------+  Brachial 177                                      +---------+------------------+-----+---------+-------+  PTA     171               0.97 biphasic          +---------+------------------+-----+---------+-------+  DP      173               0.98 triphasic         +---------+------------------+-----+---------+-------+  Great Toe184               1.04                   +---------+------------------+-----+---------+-------+   +-------+-----------+-----------+------------+------------+  ABI/TBIToday's ABIToday's TBIPrevious ABIPrevious TBI  +-------+-----------+-----------+------------+------------+  Right 0.58       0.46       0.58        0.57          +-------+-----------+-----------+------------+------------+  Left  0.98       1.04       1.11        0.96          +-------+-----------+-----------+------------+------------+       Bilateral ABIs and TBIs appear essentially unchanged compared to prior  study on 11/30/2021.    Summary:  Right: Resting right ankle-brachial index indicates moderate right lower  extremity arterial disease. The right  toe-brachial index is abnormal.   Left: Resting left ankle-brachial index is within normal range. The left  toe-brachial index is normal.   +-----------+--------+-----+--------+--------+--------+  LEFT      PSV cm/sRatioStenosisWaveformComments  +-----------+--------+-----+--------+--------+--------+  ATA Distal 52  biphasic          +-----------+--------+-----+--------+--------+--------+  PTA Distal 40                   biphasic          +-----------+--------+-----+--------+--------+--------+  PERO Distal40                   biphasic          +-----------+--------+-----+--------+--------+--------+     Left Graft #1: Femoral-popliteal  +--------------------+--------+--------+--------+--------+                     PSV cm/sStenosisWaveformComments  +--------------------+--------+--------+--------+--------+  Inflow             115             biphasic          +--------------------+--------+--------+--------+--------+  Proximal Anastomosis127             biphasic          +--------------------+--------+--------+--------+--------+  Proximal Graft      85              biphasic          +--------------------+--------+--------+--------+--------+  Mid Graft           71              biphasic          +--------------------+--------+--------+--------+--------+  Distal Graft        35              biphasic          +--------------------+--------+--------+--------+--------+  Distal Anastomosis  35              biphasic          +--------------------+--------+--------+--------+--------+  Outflow            23              biphasic          +--------------------+--------+--------+--------+--------+        Summary:  Left: Patent left femoral-popliteal bypass graft. Low velocities in the  graft could suggest a threatened bypass. Three vessel biphasic runoff  noted.   Abdominal Aorta Findings:   +-----------+-------+----------+----------+--------+--------+--------+  Location  AP (cm)Trans (cm)PSV (cm/s)WaveformThrombusComments  +-----------+-------+----------+----------+--------+--------+--------+  Proximal  2.18   2.35      30                                  +-----------+-------+----------+----------+--------+--------+--------+  Mid       2.93   3.15      36                                  +-----------+-------+----------+----------+--------+--------+--------+  Distal    3.60   4.48      30                                  +-----------+-------+----------+----------+--------+--------+--------+  RT CIA Prox1.0    1.7       273                                 +-----------+-------+----------+----------+--------+--------+--------+  LT CIA Prox1.3    1.6                                           +-----------+-------+----------+----------+--------+--------+--------+  Visualization of the Proximal Abdominal Aorta was limited.    Summary:  Abdominal Aorta: There is evidence of abnormal dilatation of the distal  Abdominal aorta. The largest aortic measurement is 4.5 cm. The largest  aortic diameter has increased compared to prior exam. Previous diameter  measurement was 3.7 cm obtained on  11/30/2021.  Stenosis: +------------------+-------------+  Location          Stenosis       +------------------+-------------+  Right Common Iliac>50% stenosis  +------------------+-------------+     Right Carotid Findings:  +----------+--------+--------+--------+------------------+--------+           PSV cm/sEDV cm/sStenosisPlaque DescriptionComments  +----------+--------+--------+--------+------------------+--------+  CCA Prox  91      6                                           +----------+--------+--------+--------+------------------+--------+  CCA Distal52      5               heterogenous                 +----------+--------+--------+--------+------------------+--------+  ICA Prox  62      18                                          +----------+--------+--------+--------+------------------+--------+  ICA Mid   91      13                                          +----------+--------+--------+--------+------------------+--------+  ICA Distal90      17                                          +----------+--------+--------+--------+------------------+--------+  ECA      125     10              heterogenous                +----------+--------+--------+--------+------------------+--------+   +----------+--------+-------+----------------+-------------------+           PSV cm/sEDV cmsDescribe        Arm Pressure (mmHG)  +----------+--------+-------+----------------+-------------------+  [id number]            Multiphasic, [id number]                  +----------+--------+-------+----------------+-------------------+   +---------+--------+--+--------+-+---------+  VertebralPSV cm/s28EDV cm/s8Antegrade  +---------+--------+--+--------+-+---------+      Left Carotid Findings:  +----------+--------+--------+--------+------------------------------+-----  ----+           PSV cm/sEDV cm/sStenosisPlaque Description             Comments   +----------+--------+--------+--------+------------------------------+-----  ----+  CCA Prox  85      9               heterogenous                              +----------+--------+--------+--------+------------------------------+-----  ----+  CCA Distal81      10  smooth and homogeneous                    +----------+--------+--------+--------+------------------------------+-----  ----+  ICA Prox  130     18              heterogenous, irregular and    Shadowing                                   calcific                                   +----------+--------+--------+--------+------------------------------+-----  ----+  ICA Mid   91      17                                                        +----------+--------+--------+--------+------------------------------+-----  ----+  ICA Distal67      16                                                        +----------+--------+--------+--------+------------------------------+-----  ----+  ECA      122     6               heterogenous and smooth                   +----------+--------+--------+--------+------------------------------+-----  ----+   +----------+--------+--------+----------------+-------------------+           PSV cm/sEDV cm/sDescribe        Arm Pressure (mmHG)  +----------+--------+--------+----------------+-------------------+  [id number]            Multiphasic, [id number]                  +----------+--------+--------+----------------+-------------------+   +---------+--------+--+--------+-+---------+  VertebralPSV cm/s24EDV cm/s5Antegrade  +---------+--------+--+--------+-+---------+         Summary:  Right Carotid: Patent right ICA endarterectomy site without evidence of                 restenosis.   Left Carotid: Velocities in the left ICA are consistent with a 1-39%  stenosis.   Vertebrals: Bilateral vertebral arteries demonstrate antegrade flow.  Subclavians: Normal flow hemodynamics were seen in bilateral subclavian               arteries.   ASSESSMENT/PLAN:  Patient is a 82 y.o. year old male who presents for evaluation of abdominal aortic aneurysm.  Last largest diameter measured was 3.7 cm. He denise lumbar/abdominal pain out of the ordinary.    He is s/p left fem-pop bypass graft in [place] in 1994. Dr. [name] revised this in 2001. He had angioplasty of this in 2016. His bypass has been patent without any recurrent stenosis on surveillance.   He denise non healing wounds and rest pain.  He is not  very ambulatory.  The mode of transportation is WC.    He is s/p right carotid endarterectomy in October of 2014. He denies any TIA or stroke like symptoms. He has not had any headaches, dizziness, facial drooping,  slurred speech, weakness or numbness of upper or lower extremities.  The AAA has increased in diameter from 3.7 cm to 4.5 cm in 1 year.  He denise abdominal or lumbar pain out of the ordinary.  We will repeat the duplex in 6 months.     Carotids are stable and B < 39% .    ABI's stable without change.   He developed low velocity in the bypass with out flow velocity 23.  Left: Patent left femoral-popliteal bypass graft. Low velocities in the graft could suggest a threatened bypass. Three vessel biphasic runoff noted.   He has no symptoms of claudication, rest pain or non healing wounds.  I will schedule him for angiogram with left LE runoff and possible intervention to prevent bypass occlusion.   I have schedule repeat surveillance of the carotids this can be schedule for 1 year and repeat AAA duplex in 6 months.      Roxy Horseman PA-C [facility] Office: [phone]  MD Trula Slade

## 2022-12-13 NOTE — Telephone Encounter (Signed)
[place]:  [place]   Phone Number:  [phone]   Needs Verbal orders for what service & frequency:  Home Health & Physical Therapy - 1 x a week for 2 weeks, then 2 x a week for 4 weeks, 1 x a week for 3 weeks

## 2022-12-14 ENCOUNTER — Other Ambulatory Visit: Payer: Self-pay

## 2022-12-14 DIAGNOSIS — I739 Peripheral vascular disease, unspecified: Secondary | ICD-10-CM

## 2022-12-14 LAB — VAS US ABI WITH/WO TBI
Left ABI: 0.98
Right ABI: 0.58

## 2022-12-14 NOTE — Telephone Encounter (Signed)
Okay for orders? 

## 2022-12-14 NOTE — Telephone Encounter (Signed)
Message left today with verbals. 

## 2022-12-20 ENCOUNTER — Encounter (HOSPITAL_COMMUNITY)
Admission: RE | Disposition: A | Discharge status: Home / Self Care | Payer: Self-pay | Source: Home / Self Care | Attending: Vascular Surgery

## 2022-12-20 ENCOUNTER — Ambulatory Visit (HOSPITAL_COMMUNITY)
Admission: RE | Admit: 2022-12-20 | Discharge: 2022-12-20 | Disposition: A | Discharge status: Home / Self Care | Payer: PPO | Attending: Vascular Surgery | Admitting: Vascular Surgery

## 2022-12-20 ENCOUNTER — Encounter (HOSPITAL_COMMUNITY): Payer: PPO | Admitting: Cardiology

## 2022-12-20 ENCOUNTER — Other Ambulatory Visit: Payer: Self-pay

## 2022-12-20 DIAGNOSIS — Z79899 Other long term (current) drug therapy: Secondary | ICD-10-CM | POA: Insufficient documentation

## 2022-12-20 DIAGNOSIS — I739 Peripheral vascular disease, unspecified: Secondary | ICD-10-CM | POA: Insufficient documentation

## 2022-12-20 DIAGNOSIS — T82898A Other specified complication of vascular prosthetic devices, implants and grafts, initial encounter: Secondary | ICD-10-CM

## 2022-12-20 DIAGNOSIS — I714 Abdominal aortic aneurysm, without rupture, unspecified: Secondary | ICD-10-CM | POA: Insufficient documentation

## 2022-12-20 DIAGNOSIS — Z87891 Personal history of nicotine dependence: Secondary | ICD-10-CM | POA: Diagnosis not present

## 2022-12-20 DIAGNOSIS — I10 Essential (primary) hypertension: Secondary | ICD-10-CM | POA: Diagnosis not present

## 2022-12-20 DIAGNOSIS — I4891 Unspecified atrial fibrillation: Secondary | ICD-10-CM | POA: Insufficient documentation

## 2022-12-20 DIAGNOSIS — Z7901 Long term (current) use of anticoagulants: Secondary | ICD-10-CM | POA: Insufficient documentation

## 2022-12-20 HISTORY — PX: ABDOMINAL AORTOGRAM W/LOWER EXTREMITY: CATH118223

## 2022-12-20 LAB — POCT I-STAT, CHEM 8
BUN: 16 mg/dL (ref 8–23)
Calcium, Ion: 1.24 mmol/L (ref 1.15–1.40)
Chloride: 102 mmol/L (ref 98–111)
Creatinine, Ser: 1.4 mg/dL — ABNORMAL HIGH (ref 0.61–1.24)
Glucose, Bld: 127 mg/dL — ABNORMAL HIGH (ref 70–99)
HCT: 41 % (ref 39.0–52.0)
Hemoglobin: 13.9 g/dL (ref 13.0–17.0)
Potassium: 4.1 mmol/L (ref 3.5–5.1)
Sodium: 139 mmol/L (ref 135–145)
TCO2: 23 mmol/L (ref 22–32)

## 2022-12-20 SURGERY — ABDOMINAL AORTOGRAM W/LOWER EXTREMITY
Anesthesia: LOCAL

## 2022-12-20 MED ORDER — FENTANYL CITRATE (PF) 100 MCG/2ML IJ SOLN
INTRAMUSCULAR | Status: DC | PRN
  Administered 2022-12-20: 25 ug via INTRAVENOUS

## 2022-12-20 MED ORDER — LIDOCAINE HCL (PF) 1 % IJ SOLN
INTRAMUSCULAR | Status: DC | PRN
  Administered 2022-12-20: 20 mL

## 2022-12-20 MED ORDER — SODIUM CHLORIDE 0.9 % IV SOLN: INTRAVENOUS | Status: DC

## 2022-12-20 MED ORDER — LABETALOL HCL 5 MG/ML IV SOLN: 10.0000 mg | INTRAVENOUS | Status: DC | PRN

## 2022-12-20 MED ORDER — SODIUM CHLORIDE 0.9% FLUSH: 3.0000 mL | INTRAVENOUS | Status: DC | PRN

## 2022-12-20 MED ORDER — HYDRALAZINE HCL 20 MG/ML IJ SOLN: 5.0000 mg | INTRAMUSCULAR | Status: DC | PRN

## 2022-12-20 MED ORDER — MIDAZOLAM HCL 2 MG/2ML IJ SOLN
INTRAMUSCULAR | Status: AC
  Filled 2022-12-20: qty 2

## 2022-12-20 MED ORDER — ACETAMINOPHEN 325 MG PO TABS: 650.0000 mg | ORAL_TABLET | ORAL | Status: DC | PRN

## 2022-12-20 MED ORDER — ONDANSETRON HCL 4 MG/2ML IJ SOLN: 4.0000 mg | Freq: Four times a day (QID) | INTRAMUSCULAR | Status: DC | PRN

## 2022-12-20 MED ORDER — FENTANYL CITRATE (PF) 100 MCG/2ML IJ SOLN
INTRAMUSCULAR | Status: AC
  Filled 2022-12-20: qty 2

## 2022-12-20 MED ORDER — SODIUM CHLORIDE 0.9% FLUSH: 3.0000 mL | Freq: Two times a day (BID) | INTRAVENOUS | Status: DC

## 2022-12-20 MED ORDER — LIDOCAINE HCL (PF) 1 % IJ SOLN
INTRAMUSCULAR | Status: AC
  Filled 2022-12-20: qty 30

## 2022-12-20 MED ORDER — HEPARIN (PORCINE) IN NACL 1000-0.9 UT/500ML-% IV SOLN
INTRAVENOUS | Status: DC | PRN
  Administered 2022-12-20 (×2): 500 mL

## 2022-12-20 MED ORDER — MIDAZOLAM HCL 2 MG/2ML IJ SOLN
INTRAMUSCULAR | Status: DC | PRN
  Administered 2022-12-20: 1 mg via INTRAVENOUS

## 2022-12-20 MED ORDER — SODIUM CHLORIDE 0.9 % IV SOLN: 250.0000 mL | INTRAVENOUS | Status: DC | PRN

## 2022-12-20 MED ORDER — IODIXANOL 320 MG/ML IV SOLN
INTRAVENOUS | Status: DC | PRN
  Administered 2022-12-20: 60 mL via INTRA_ARTERIAL

## 2022-12-20 SURGICAL SUPPLY — 13 items
CATH OMNI FLUSH 5F 65CM (CATHETERS) IMPLANT
DEVICE CLOSURE MYNXGRIP 5F (Vascular Products) IMPLANT
KIT ANGIASSIST CO2 SYSTEM (KITS) IMPLANT
KIT MICROPUNCTURE NIT STIFF (SHEATH) IMPLANT
KIT PV (KITS) ×1 IMPLANT
SHEATH PINNACLE 5F 10CM (SHEATH) IMPLANT
SHEATH PROBE COVER 6X72 (BAG) IMPLANT
STOPCOCK MORSE 400PSI 3WAY (MISCELLANEOUS) IMPLANT
SYR MEDRAD MARK 7 150ML (SYRINGE) ×1 IMPLANT
TRANSDUCER W/STOPCOCK (MISCELLANEOUS) ×1 IMPLANT
TRAY PV CATH (CUSTOM PROCEDURE TRAY) ×1 IMPLANT
TUBING CIL FLEX 10 FLL-RA (TUBING) IMPLANT
WIRE BENTSON .035X145CM (WIRE) IMPLANT

## 2022-12-20 NOTE — Op Note (Signed)
    Patient name: Troy Canner [name] MRN: [id number] DOB: March 16, 1941 Sex: male  12/20/2022 Pre-operative Diagnosis: Concern for threatened left common femoral to below-knee popliteal vein bypass with sluggish flow Post-operative diagnosis:  Same Surgeon:  Marty Heck, MD Procedure Performed: 1.  Ultrasound-guided access right common femoral artery 2.  CO2 aortogram with catheter selection of aorta 3.  Left lower extremity arteriogram with selection of second-order branches 4.  Mynx closure of the right common femoral artery 5.  52 minutes of monitored moderate conscious sedation time  Indications: Patient is a 82 year old male the previously underwent a left common femoral to below-knee popliteal bypass in 1994 in [place].  He has had previous revision as well as angioplasty by Dr. [name] in our practice.  He presents today for aortogram with lower extremity arteriogram with a focus on the bypass due to sluggish velocities after risks benefits discussed.  Findings:   Initial aortogram was performed with CO2.  Due to his abdominal aortic aneurysm the CO2 showed very poor visualization of his iliac arteries.  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  A pullback pressure was done here and the pressure difference from the left common femoral to the aorta was less than 5 mmHg.  The left common femoral to below-knee popliteal bypass is widely patent with no focal stenosis although flow is very sluggish.  The profunda is patent with a moderate to high-grade stenosis in the proximal profunda.  Distally below the bypass there is two-vessel runoff in the peroneal and posterior tibial that are small.   Procedure:  The patient was identified in the holding area and taken to room 7.  The patient was then placed supine on the table and prepped and draped in the usual sterile fashion.  A time out was called.   Ultrasound was used to evaluate the right common femoral artery.  It was patent .  A digital ultrasound image was acquired.  A micropuncture needle was used to access the right common femoral artery under ultrasound guidance.  An 018 wire was advanced without resistance and a micropuncture sheath was placed.  The 018 wire was removed and a benson wire was placed.  The micropuncture sheath was exchanged for a 5 french sheath.  An omniflush catheter was advanced over the wire to the level of L-1.  An abdominal angiogram was obtained.  Next, using the omniflush catheter and a benson wire, the aortic bifurcation was crossed and the catheter was placed into theleft external iliac artery and left runoff was obtained.  We tried to do most of the case with CO2 but these images were of very poor quality.  We then used as limited contrast as possible.  Ultimately no focal flow-limiting stenosis was identified.  The bypass remains widely patent with two-vessel runoff.  Wires and catheters were removed.  A mynx closure device was deployed in the right common femoral artery.  Plan: No intervention was performed given no flow-limiting stenosis identified.  Will arrange follow-up in 6 months in the [facility] clinic for surveillance.     Marty Heck, MD [facility] Office: [phone]

## 2022-12-20 NOTE — Progress Notes (Signed)
Attempted to call report to [facility] multiple times without answer. Discharge instructions given to pt and daughter both verbalizing understanding.

## 2022-12-20 NOTE — H&P (Signed)
History and Physical Interval Note:  12/20/2022 11:07 AM  Troy Kindred [name]  has presented today for surgery, with the diagnosis of pad.  The various methods of treatment have been discussed with the patient and family. After consideration of risks, benefits and other options for treatment, the patient has consented to  Procedure(s): ABDOMINAL AORTOGRAM W/LOWER EXTREMITY (N/A) as a surgical intervention.  The patient's history has been reviewed, patient examined, no change in status, stable for surgery.  I have reviewed the patient's chart and labs.  Questions were answered to the patient's satisfaction.     [facility]  HISTORY [facility] PHYS[facility]    History of Present Illness:  Patient is a 82 y.o. year old male who presents for evaluation of abdominal aortic aneurysm.  Last largest diameter measured was 3.7 cm. He denise lumbar/abdominal pain out of the ordinary.               He is s/p left fem-pop bypass graft in [place] in 1994. Dr. [name] revised this in 2001. He had angioplasty of this in 2016. His bypass has been patent without any recurrent stenosis on surveillance.   He denise non healing wounds and rest pain.  He is not very ambulatory.  The mode of transportation is WC.               He is s/p right carotid endarterectomy in October of 2014. He denies any TIA or stroke like symptoms. He has not had any headaches, dizziness, facial drooping, slurred speech, weakness or numbness of upper or lower extremities                The pt is on a statin for cholesterol management.  The pt is not on a daily aspirin.   Other AC:  Eliquis (a. Fib) The pt is on amlodipine for hypertension.   The pt is not diabetic.   Tobacco hx:  Former smoker, quit 2015           Past Medical History:  Diagnosis Date   AAA (abdominal aortic aneurysm) ([place]) 10-20-17    MONITORED BY DR [name], 3.7 CM by ABDOMINAL US 11/2021   Anxiety     Arthritis      Atrial fibrillation (HCC)     Basal cell carcinoma     BPH (benign prostatic hypertrophy)     Chronic diastolic heart failure (HCC)     Chronic kidney disease      stage 3, pt unaware   Colon cancer (HCC)     COPD (chronic obstructive pulmonary disease) ([place])     Depression     GERD (gastroesophageal reflux disease)     Glaucoma BOTH EYES    Dr Gershon Crane   History of lung cancer APRIL 2012    ONCOLOGIST- DR Tressie Stalker  LOV IN [facility] 10-27-2012 SQUAMOUS CELL---- S/P RIGHT LOWER LOBECTOMY AT [facility] -- NO CHEMORADIATION--- NO RECURRENCE   Hx of adenomatous colonic polyps 2005     X 2; 1 hyperplastic polyp; Dr Olevia Perches   Hyperlipidemia     Hypertension     OSA (obstructive sleep apnea) 08/29/2015    CPAP SET ON 10   Peripheral vascular disease ([place])      FOLLOWED  BY DR DICKSON   Spinal stenosis 06/2014   Thoracic aorta atherosclerosis (HCC)     Thyrotoxicosis      amiodarone induced  Past Surgical History:  Procedure Laterality Date   ANGIO PLASTY        X 4 in legs   AORTOGRAM   07-27-2002    MILD DIFFUSE ILIAC ARTERY OCCLUSIVE DISEASE /  LEFT RENAL ARTERY 20%/ PATENT LEFT FEM-POP GRAFT/ MILD SFA AND POPLITEAL ARTERY OCCLUSIVE DISEASE W/ SEVERE KIDNEY OCCLUSIVE DISEASE   ATRIAL FIBRILLATION ABLATION N/A 04/09/2019    Procedure: ATRIAL FIBRILLATION ABLATION;  Surgeon: Thompson Grayer, MD;  Location: [place] [facility];  Service: Cardiovascular;  Laterality: N/A;   ATRIAL FIBRILLATION ABLATION N/A 02/12/2020    Procedure: ATRIAL FIBRILLATION ABLATION;  Surgeon: Thompson Grayer, MD;  Location: [place] [facility];  Service: Cardiovascular;  Laterality: N/A;   BALLOON DILATION N/A 02/06/2022    Procedure: BALLOON Tamponade ampulla;  Surgeon: Irving Copas., MD;  Location[name];  Service: Gastroenterology;  Laterality: N/A;   BASAL CELL CARCINOMA EXCISION        MULTIPLE TIMES--  RIGHT FOREARM, CHEEKS, AND BACK   BIOPSY   06/25/2018    Procedure: BIOPSY;  Surgeon:  Rush Landmark Telford Nab., MD;  Location: [place];  Service: Gastroenterology;;   BIOPSY   02/06/2022    Procedure: BIOPSY;  Surgeon: Irving Copas., MD;  [facility]: [facility];  Service: Gastroenterology;;   CARDIOVASCULAR STRESS TEST   12-08-2012  DR [facility]    NORMAL LEXISCAN WITH NO EXERCISE NUCLEAR STUDY/ EF 66%/   NO ISCHEMIA/ NO SIGNIFICANT CHANGE FROM PRIOR STUDY   CARDIOVERSION N/A 02/14/2018    Procedure: CARDIOVERSION;  Surgeon: Larey Dresser, MD;  Location: [facility] ENDOSCOPY;  Service: Cardiovascular;  Laterality: N/A;   CARDIOVERSION N/A 07/10/2022    Procedure: CARDIOVERSION;  Surgeon: Larey Dresser, MD;  Location: [place];  Service: Cardiovascular;  Laterality: N/A;   CAROTID ANGIOGRAM N/A 07/10/2013    Procedure: CAROTID ANGIOGRAM;  Surgeon: Elam Dutch, MD;  Location: [facility] CATH LAB;  Service: Cardiovascular;  Laterality: N/A;   CAROTID ENDARTERECTOMY Right 07-14-13    cea   CATARACT EXTRACTION W/ INTRAOCULAR LENS  IMPLANT, BILATERAL       colon polyectomy       COLONOSCOPY       COLONOSCOPY WITH PROPOFOL N/A 06/25/2018    Procedure: COLONOSCOPY WITH PROPOFOL;  Surgeon: Rush Landmark Telford Nab., MD;  Location: [place];  Service: Gastroenterology;  Laterality: N/A;   CYSTOSCOPY W/ RETROGRADES Bilateral 01/21/2013    Procedure: CYSTOSCOPY WITH RETROGRADE PYELOGRAM;  Surgeon: Molli Hazard, MD;  Location: [facility];  Service: Urology;  Laterality: Bilateral;   CYSTO, BLADDER BIOPSY, BILATERAL RETROGRADE PYELOGRAM  RAD TECH FROM RADIOLOGY PER JOY   CYSTOSCOPY WITH BIOPSY N/A 01/21/2013    Procedure: CYSTOSCOPY WITH BIOPSY;  Surgeon: Molli Hazard, MD;  Location: [facility];  Service: Urology;  Laterality: N/A;   ENDARTERECTOMY Right 07/14/2013    Procedure: ENDARTERECTOMY CAROTID;  Surgeon: Angelia Mould, MD;  Location: [facility] OR;  Service: Vascular;  Laterality: Right;   ENDOSCOPIC RETROGRADE  CHOLANGIOPANCREATOGRAPHY (ERCP) WITH PROPOFOL N/A 02/06/2022    Procedure: ENDOSCOPIC RETROGRADE CHOLANGIOPANCREATOGRAPHY (ERCP) WITH PROPOFOL;  Surgeon: Irving Copas., MD;  Location: [name];  Service: Gastroenterology;  Laterality: N/A;   EP IMPLANTABLE DEVICE N/A 08/12/2015    Procedure: Loop Recorder Insertion;  Surgeon: Thompson Grayer, MD;  Location: [name] [facility];  Service: Cardiovascular;  Laterality: N/A;   ESOPHAGOGASTRODUODENOSCOPY Left 02/13/2022    Procedure: ESOPHAGOGASTRODUODENOSCOPY (EGD);  Surgeon: Lavena Bullion, DO;  Location: [place];  Service: Gastroenterology;  Laterality: Left;   ESOPHAGOGASTRODUODENOSCOPY (EGD) WITH PROPOFOL N/A 06/25/2018    Procedure: ESOPHAGOGASTRODUODENOSCOPY (EGD) WITH PROPOFOL;  Surgeon: Rush Landmark Telford Nab., MD;  Location: [place];  Service: Gastroenterology;  Laterality: N/A;   ESOPHAGOGASTRODUODENOSCOPY (EGD) WITH PROPOFOL N/A 07/10/2018    Procedure: ESOPHAGOGASTRODUODENOSCOPY (EGD) WITH PROPOFOL;  Surgeon: Milus Banister, MD;  [facility];  Service: Endoscopy;  Laterality: N/A;   EUS N/A 07/10/2018    Procedure: UPPER ENDOSCOPIC ULTRASOUND (EUS) RADIAL;  Surgeon: Milus Banister, MD;  [facility];  Service: Endoscopy;  Laterality: N/A;   EYE SURGERY Right     FEMORAL-POPLITEAL BYPASS GRAFT Left [place]    AND 2001  IN [place]   FEMORAL-POPLITEAL BYPASS GRAFT Left 08/30/2015    Procedure: REVISION OF BYPASS GRAFT Left  FEMORAL-POPLITEAL ARTERY;  Surgeon: Angelia Mould, MD;  Location: [place];  Service: Vascular;  Laterality: Left;   FINE NEEDLE ASPIRATION N/A 07/10/2018    Procedure: FINE NEEDLE ASPIRATION (FNA) LINEAR;  Surgeon: Milus Banister, MD;  [facility];  Service: Endoscopy;  Laterality: N/A;   FLEXIBLE SIGMOIDOSCOPY N/A 12/12/2018    Procedure: FLEXIBLE SIGMOIDOSCOPY;  Surgeon: Ileana Roup, MD;  [facility];  Service: General;  Laterality:  N/A;   HEMOSTASIS CLIP PLACEMENT   02/13/2022    Procedure: HEMOSTASIS CLIP PLACEMENT;  Surgeon: Lavena Bullion, DO;  Location: [place];  Service: Gastroenterology;;   HOT HEMOSTASIS N/A 02/13/2022    Procedure: HOT HEMOSTASIS (ARGON PLASMA COAGULATION/BICAP);  Surgeon: Lavena Bullion, DO;  Location: [facility] ENDOSCOPY;  Service: Gastroenterology;  Laterality: N/A;   LARYNGOSCOPY   06-27-2004    BX VOCAL CORD  (LEUKOPLAKIA)  PER PT NO ISSUES SINCE   LEFT HEART CATH AND CORONARY ANGIOGRAPHY N/A 08/20/2017    Procedure: LEFT HEART CATH AND CORONARY ANGIOGRAPHY;  Surgeon: Larey Dresser, MD;  Location: [place] [facility];  Service: Cardiovascular;  Laterality: N/A;   LOOP RECORDER INSERTION N/A 04/09/2019    Procedure: LOOP RECORDER INSERTION;  Surgeon: Thompson Grayer, MD;  Location: [place] [facility];  Service: Cardiovascular;  Laterality: N/A;   LOOP RECORDER REMOVAL N/A 04/09/2019    Procedure: LOOP RECORDER REMOVAL;  Surgeon: Thompson Grayer, MD;  Location: [place] [facility];  Service: Cardiovascular;  Laterality: N/A;   LOWER EXTREMITY ANGIOGRAM Bilateral 08/29/2015    Procedure: Lower Extremity Angiogram;  Surgeon: Angelia Mould, MD;  Location: [place] [facility];  Service: Cardiovascular;  Laterality: Bilateral;   LUNG LOBECTOMY   01/24/2011     RIGHT UPPER LOBE  (SQUAMOUS CELL CARCINOMA) Dr Dorthea Cove , [facility]. No chemotherapyor radiation   PANCREATIC STENT PLACEMENT   02/06/2022    Procedure: PANCREATIC STENT PLACEMENT;  Surgeon: Rush Landmark Telford Nab., MD;  Location: [name];  Service: Gastroenterology;;   [facility] ANGIOPLASTY Right 07/14/2013    Procedure: PATCH ANGIOPLASTY;  Surgeon: Angelia Mould, MD;  Location: [place];  Service: Vascular;  Laterality: Right;   PATCH ANGIOPLASTY Left 08/30/2015    Procedure: VEIN PATCH ANGIOPLASTY OF PROXIMAL Left BYPASS GRAFT;  Surgeon: Angelia Mould, MD;  Location: [place];  Service: Vascular;  Laterality: Left;   PERIPHERAL  VASCULAR CATHETERIZATION N/A 08/29/2015    Procedure: Abdominal Aortogram;  Surgeon: Angelia Mould, MD;  Location: [place] [facility];  Service: Cardiovascular;  Laterality: N/A;   POLYPECTOMY   06/25/2018    Procedure: POLYPECTOMY;  Surgeon: Rush Landmark Telford Nab., MD;  Location: [place];  Service: Gastroenterology;;   POLYPECTOMY       POLYPECTOMY   02/13/2022    Procedure: POLYPECTOMY;  Surgeon: Lavena Bullion, DO;  Location: [place] [facility];  Service: Gastroenterology;;   REMOVAL OF STONES   02/06/2022    Procedure: REMOVAL OF STONES;  Surgeon: Irving Copas., MD;  Location[name];  Service: Gastroenterology;;   Clide Deutscher   02/13/2022    Procedure: Clide Deutscher;  Surgeon: Lavena Bullion, DO;  Location: [name];  Service: Gastroenterology;;   Joan Mayans   02/06/2022    Procedure: Joan Mayans;  Surgeon: Irving Copas., MD;  Location: [name];  Service: Gastroenterology;;   SUBMUCOSAL INJECTION   06/25/2018    Procedure: SUBMUCOSAL INJECTION;  Surgeon: Irving Copas., MD;  Location: [place];  Service: Gastroenterology;;   TEE WITHOUT CARDIOVERSION N/A 02/14/2018    Procedure: TRANSESOPHAGEAL ECHOCARDIOGRAM (TEE);  Surgeon: Larey Dresser, MD;  Location: [facility] ENDOSCOPY;  Service: Cardiovascular;  Laterality: N/A;   trabecular surgery        OS   TRANSTHORACIC ECHOCARDIOGRAM   12-29-2012  DR [facility]    MILD LVH/  LVSF NORMAL/ EF [id number]  GRADE I DIASTOLIC DYSFUNCTION        Social History Social History         Tobacco Use   Smoking status: Former      Packs/day: 0.50      Years: 50.00      Additional pack years: 0.00      Total pack years: 25.00      Types: Cigarettes      Quit date: 07/28/2014      Years since quitting: 8.3      Passive exposure: Never   Smokeless tobacco: Never  Vaping Use   Vaping Use: Never used  Substance Use Topics   Alcohol use: Yes      Alcohol/week: 4.0 standard drinks of alcohol       Types: 1 Glasses of wine, 1 Cans of beer, 1 Shots of liquor, 1 Standard drinks or equivalent per week      Comment: socially, variable; moderately heavy use in the past   Drug use: No      Family History      Family History  Problem Relation Age of Onset   Stroke Mother          mini strokes   Alcohol abuse Father     Heart disease Father          MI after 44   Stroke Father     Hypertension Father     Heart attack Father     Heart disease Paternal Aunt          several   Hypertension Paternal Aunt          several   Stroke Paternal Aunt          several   Stroke Paternal Uncle          several   Heart disease Paternal Uncle          several;2 had MI pre 87   Cancer Daughter 81        breast ca, also with benign sessile polyp    Colon cancer Daughter 44   Colon polyps Neg Hx     Esophageal cancer Neg Hx     Rectal cancer Neg Hx     Stomach cancer Neg Hx        Allergies  Allergies  Allergen Reactions   Niacin-Lovastatin Er Shortness Of Breath and Other (See Comments)      Dyspnea Flushing   Penicillins Hives, Shortness Of Breath and Other (See Comments)      Dyspnea Flushing Because of a history of documented adverse serious drug reaction;Medi Alert bracelet  is recommended PCN reaction causing immediate rash, facial/tongue/throat swelling, SOB or lightheadedness with hypotension: Yes PCN reaction causing severe rash involving mucus membranes or skin necrosis: No PCN reaction occurring within the last 10 years: NO PCN reaction that required hospitalization: NO   Sulfonamide Derivatives Hives, Shortness Of Breath and Other (See Comments)      Dyspnea  Flushing Because of a history of documented adverse serious drug reaction;Medi Alert bracelet  is recommended   Lipitor [Atorvastatin] Other (See Comments)      Myalgias Arthralgias               Current Outpatient Medications  Medication Sig Dispense Refill   acetaminophen (TYLENOL) 325 MG  tablet Take 650 mg by mouth every 6 (six) hours as needed for moderate pain or headache.       albuterol (VENTOLIN HFA) 108 (90 Base) MCG/ACT inhaler Inhale 2 puffs into the lungs every 6 (six) hours as needed for wheezing or shortness of breath. 8.5 g 0   amLODipine (NORVASC) 10 MG tablet Take 1 tablet (10 mg total) by mouth daily. 90 tablet 3   Brinzolamide-Brimonidine (SIMBRINZA) 1-0.2 % SUSP Place 1 drop into both eyes 3 (three) times daily.       Budeson-Glycopyrrol-Formoterol (BREZTRI AEROSPHERE) 160-9-4.8 MCG/ACT AERO Inhale 2 puffs into the lungs in the morning and at bedtime. (Patient taking differently: Inhale 2 puffs into the lungs daily.) 10.7 g 0   docusate sodium (COLACE) 100 MG capsule Take 100 mg by mouth 2 (two) times daily as needed (constipation).       escitalopram (LEXAPRO) 10 MG tablet TAKE ONE TABLET BY MOUTH EVERY DAY (Patient taking differently: Take 10 mg by mouth daily.) 28 tablet 5   furosemide (LASIX) 20 MG tablet Take 4 tablets (80 mg total) by mouth 2 (two) times daily. 450 tablet 3   JARDIANCE 10 MG TABS tablet TAKE ONE TABLET BY MOUTH EVERY DAY BEFORE BREAKFAST. (Patient taking differently: Take 10 mg by mouth daily.) 30 tablet 11   latanoprost (XALATAN) 0.005 % ophthalmic solution Place 1 drop into both eyes at bedtime.       Melatonin 10 MG TABS Take 20 mg by mouth at bedtime.       metoprolol succinate (TOPROL-XL) 25 MG 24 hr tablet Take 1 tablet (25 mg total) by mouth daily. (Patient taking differently: Take 25 mg by mouth at bedtime.) 90 tablet 3   montelukast (SINGULAIR) 10 MG tablet TAKE ONE TABLET BY MOUTH AT BEDTIME (Patient taking differently: Take 10 mg by mouth at bedtime.) 28 tablet 5   omeprazole (PRILOSEC) 20 MG capsule Take 1 capsule (20 mg total) by mouth 2 (two) times daily. 60 capsule 3   OVER THE COUNTER MEDICATION Take 3 capsules by mouth daily. Balance of Nature Fruits       OVER THE COUNTER MEDICATION Take 3 capsules by mouth daily. Balance of  Nature Veggies       Polyethyl Glycol-Propyl Glycol (SYSTANE ULTRA) 0.4-0.3 % SOLN Place 1 drop into both eyes daily as needed (dry eyes).       potassium chloride SA (KLOR-CON M) 20 MEQ tablet TAKE TWO TABLETS BY MOUTH IN THE MORNING  AND TAKE ONE TABLET IN THE EVENING (Patient taking differently: Take 20-40 mEq by mouth See admin instructions. 40 mEq in the morning, 20 mEq in the evening) 90 tablet 6   Probiotic Product (DIGESTIVE ADVANTAGE) CAPS Take 1 capsule by mouth daily.       rosuvastatin (CRESTOR) 40 MG tablet TAKE ONE TABLET BY MOUTH EVERY DAY (Patient taking differently: Take 40 mg by mouth daily.) 90 tablet 3   apixaban (ELIQUIS) 2.5 MG TABS tablet Take 1 tablet (2.5 mg total) by mouth 2 (two) times daily. 60 tablet 0   spironolactone (ALDACTONE) 25 MG tablet Take 1 tablet (25 mg total) by mouth daily. 30 tablet 0    No current facility-administered medications for this visit.      ROS:    General:  No weight loss, Fever, chills   HEENT: No recent headaches, no nasal bleeding, no visual changes, no sore throat   Neurologic: No dizziness, blackouts, seizures. No recent symptoms of stroke or mini- stroke. No recent episodes of slurred speech, or temporary blindness.   Cardiac: No recent episodes of chest pain/pressure, no shortness of breath at rest.  No shortness of breath with exertion.  Denies history of atrial fibrillation or irregular heartbeat   Vascular: No history of rest pain in feet.  No history of claudication.  No history of non-healing ulcer, No history of DVT    Pulmonary: No home oxygen, no productive cough, no hemoptysis,  No asthma or wheezing   Musculoskeletal:  [ ]  Arthritis, [ ]  Low back pain,  [ ]  Joint pain   Hematologic:No history of hypercoagulable state.  No history of easy bleeding.  No history of anemia   Gastrointestinal: No hematochezia or melena,  No gastroesophageal reflux, no trouble swallowing   Urinary: [ x] chronic Kidney disease, [ ]  on HD  - [ ]  MWF or [ ]  TTHS, [ ]  Burning with urination, [ ]  Frequent urination, [ ]  Difficulty urinating;    Skin: No rashes   Psychological: No history of anxiety,  No history of depression     Physical Examination       Vitals:    12/13/22 1036 12/13/22 1039  BP: (!) 171/73 (!) 176/78  Pulse: 84    Resp: 20    Temp: 97.9 F (36.6 C)    TempSrc: Temporal    SpO2: 95%    Weight: 178 lb (80.7 kg)    Height: 6\' 1"  (1.854 m)        Body mass index is 23.48 kg/m.   General:  Alert and oriented, no acute distress HEENT: Normal Neck: No bruit or JVD Pulmonary: Clear to auscultation bilaterally Cardiac: Regular Rate and Rhythm without murmur Gastrointestinal: Soft, non-tender, non-distended, no mass, no scars Skin: No rash Extremity Pulses:   radial,  femoral B  Musculoskeletal: No deformity or edema      Neurologic: Upper and lower extremity motor 5/5 and symmetric   DATA:  ABI Findings:  +---------+------------------+-----+----------+--------+  Right   Rt Pressure (mmHg)IndexWaveform  Comment   +---------+------------------+-----+----------+--------+  Brachial 176                                        +---------+------------------+-----+----------+--------+  PTA     87                0.49 monophasic          +---------+------------------+-----+----------+--------+  DP      103               0.58 monophasic          +---------+------------------+-----+----------+--------+  Great Toe81                0.46                     +---------+------------------+-----+----------+--------+   +---------+------------------+-----+---------+-------+  Left    Lt Pressure (mmHg)IndexWaveform Comment  +---------+------------------+-----+---------+-------+  Brachial 177                                      +---------+------------------+-----+---------+-------+  PTA     171               0.97 biphasic           +---------+------------------+-----+---------+-------+  DP      173               0.98 triphasic         +---------+------------------+-----+---------+-------+  Great Toe184               1.04                   +---------+------------------+-----+---------+-------+   +-------+-----------+-----------+------------+------------+  ABI/TBIToday's ABIToday's TBIPrevious ABIPrevious TBI  +-------+-----------+-----------+------------+------------+  Right 0.58       0.46       0.58        0.57          +-------+-----------+-----------+------------+------------+  Left  0.98       1.04       1.11        0.96          +-------+-----------+-----------+------------+------------+       Bilateral ABIs and TBIs appear essentially unchanged compared to prior  study on 11/30/2021.    Summary:  Right: Resting right ankle-brachial index indicates moderate right lower  extremity arterial disease. The right toe-brachial index is abnormal.   Left: Resting left ankle-brachial index is within normal range. The left  toe-brachial index is normal.   +-----------+--------+-----+--------+--------+--------+  LEFT      PSV cm/sRatioStenosisWaveformComments  +-----------+--------+-----+--------+--------+--------+  ATA Distal 52                   biphasic          +-----------+--------+-----+--------+--------+--------+  PTA Distal 40                   biphasic          +-----------+--------+-----+--------+--------+--------+  PERO Distal40                   biphasic          +-----------+--------+-----+--------+--------+--------+     Left Graft #1: Femoral-popliteal  +--------------------+--------+--------+--------+--------+                     PSV cm/sStenosisWaveformComments  +--------------------+--------+--------+--------+--------+  Inflow             115             biphasic           +--------------------+--------+--------+--------+--------+  Proximal Anastomosis127             biphasic          +--------------------+--------+--------+--------+--------+  Proximal Graft      85  biphasic          +--------------------+--------+--------+--------+--------+  Mid Graft           71              biphasic          +--------------------+--------+--------+--------+--------+  Distal Graft        35              biphasic          +--------------------+--------+--------+--------+--------+  Distal Anastomosis  35              biphasic          +--------------------+--------+--------+--------+--------+  Outflow            23              biphasic          +--------------------+--------+--------+--------+--------+        Summary:  Left: Patent left femoral-popliteal bypass graft. Low velocities in the  graft could suggest a threatened bypass. Three vessel biphasic runoff  noted.    Abdominal Aorta Findings:  +-----------+-------+----------+----------+--------+--------+--------+  Location  AP (cm)Trans (cm)PSV (cm/s)WaveformThrombusComments  +-----------+-------+----------+----------+--------+--------+--------+  Proximal  2.18   2.35      30                                  +-----------+-------+----------+----------+--------+--------+--------+  Mid       2.93   3.15      36                                  +-----------+-------+----------+----------+--------+--------+--------+  Distal    3.60   4.48      30                                  +-----------+-------+----------+----------+--------+--------+--------+  RT CIA Prox1.0    1.7       273                                 +-----------+-------+----------+----------+--------+--------+--------+  LT CIA Prox1.3    1.6                                           +-----------+-------+----------+----------+--------+--------+--------+    Visualization of the Proximal Abdominal Aorta was limited.    Summary:  Abdominal Aorta: There is evidence of abnormal dilatation of the distal  Abdominal aorta. The largest aortic measurement is 4.5 cm. The largest  aortic diameter has increased compared to prior exam. Previous diameter  measurement was 3.7 cm obtained on  11/30/2021.  Stenosis: +------------------+-------------+  Location          Stenosis       +------------------+-------------+  Right Common Iliac>50% stenosis  +------------------+-------------+     Right Carotid Findings:  +----------+--------+--------+--------+------------------+--------+           PSV cm/sEDV cm/sStenosisPlaque DescriptionComments  +----------+--------+--------+--------+------------------+--------+  CCA Prox  91      6                                           +----------+--------+--------+--------+------------------+--------+  CCA Distal52      5               heterogenous                +----------+--------+--------+--------+------------------+--------+  ICA Prox  62      18                                          +----------+--------+--------+--------+------------------+--------+  ICA Mid   91      13                                          +----------+--------+--------+--------+------------------+--------+  ICA Distal90      17                                          +----------+--------+--------+--------+------------------+--------+  ECA      125     10              heterogenous                +----------+--------+--------+--------+------------------+--------+   +----------+--------+-------+----------------+-------------------+           PSV cm/sEDV cmsDescribe        Arm Pressure (mmHG)  +----------+--------+-------+----------------+-------------------+  [id number]            Multiphasic, [id number]                   +----------+--------+-------+----------------+-------------------+   +---------+--------+--+--------+-+---------+  VertebralPSV cm/s28EDV cm/s8Antegrade  +---------+--------+--+--------+-+---------+      Left Carotid Findings:  +----------+--------+--------+--------+------------------------------+-----  ----+           PSV cm/sEDV cm/sStenosisPlaque Description             Comments   +----------+--------+--------+--------+------------------------------+-----  ----+  CCA Prox  85      9               heterogenous                              +----------+--------+--------+--------+------------------------------+-----  ----+  CCA Distal81      10              smooth and homogeneous                    +----------+--------+--------+--------+------------------------------+-----  ----+  ICA Prox  130     18              heterogenous, irregular and    Shadowing                                   calcific                                  +----------+--------+--------+--------+------------------------------+-----  ----+  ICA Mid   91      17                                                        +----------+--------+--------+--------+------------------------------+-----  ----+  ICA Distal67      16                                                        +----------+--------+--------+--------+------------------------------+-----  ----+  ECA      122     6               heterogenous and smooth                   +----------+--------+--------+--------+------------------------------+-----  ----+   +----------+--------+--------+----------------+-------------------+           PSV cm/sEDV cm/sDescribe        Arm Pressure (mmHG)  +----------+--------+--------+----------------+-------------------+  [id number]            Multiphasic, [id number]                   +----------+--------+--------+----------------+-------------------+   +---------+--------+--+--------+-+---------+  VertebralPSV cm/s24EDV cm/s5Antegrade  +---------+--------+--+--------+-+---------+         Summary:  Right Carotid: Patent right ICA endarterectomy site without evidence of                 restenosis.   Left Carotid: Velocities in the left ICA are consistent with a 1-39%  stenosis.   Vertebrals: Bilateral vertebral arteries demonstrate antegrade flow.  Subclavians: Normal flow hemodynamics were seen in bilateral subclavian               arteries.    ASSESSMENT/PLAN:   Patient is a 82 y.o. year old male who presents for evaluation of abdominal aortic aneurysm.  Last largest diameter measured was 3.7 cm. He denise lumbar/abdominal pain out of the ordinary.               He is s/p left fem-pop bypass graft in [place] in 1994. Dr. [name] revised this in 2001. He had angioplasty of this in 2016. His bypass has been patent without any recurrent stenosis on surveillance.   He denise non healing wounds and rest pain.  He is not very ambulatory.  The mode of transportation is WC.               He is s/p right carotid endarterectomy in October of 2014. He denies any TIA or stroke like symptoms. He has not had any headaches, dizziness, facial drooping, slurred speech, weakness or numbness of upper or lower extremities.   The AAA has increased in diameter from 3.7 cm to 4.5 cm in 1 year.  He denise abdominal or lumbar pain out of the ordinary.  We will repeat the duplex in 6 months.                Carotids are stable and B < 39% .     ABI's stable without change.   He developed low velocity in the bypass with out flow velocity 23.  Left: Patent left femoral-popliteal bypass graft. Low velocities in the graft could suggest a threatened bypass. Three vessel biphasic runoff noted.   He has no symptoms of claudication, rest pain or non healing wounds.  I will schedule him for  angiogram with left LE runoff and possible intervention to prevent bypass occlusion.     I have schedule repeat surveillance of the carotids this can be schedule for 1 year and repeat AAA duplex in 6 months.  Roxy Horseman PA-C [facility] Office: [phone]   MD Trula Slade

## 2022-12-20 NOTE — Progress Notes (Signed)
Pt ambulated in room with no signs of oozing from Right groin site

## 2022-12-21 ENCOUNTER — Encounter (HOSPITAL_COMMUNITY): Payer: Self-pay | Admitting: Vascular Surgery

## 2022-12-24 DIAGNOSIS — G4733 Obstructive sleep apnea (adult) (pediatric): Secondary | ICD-10-CM | POA: Diagnosis not present

## 2022-12-25 DIAGNOSIS — H547 Unspecified visual loss: Secondary | ICD-10-CM | POA: Diagnosis not present

## 2022-12-25 DIAGNOSIS — I4891 Unspecified atrial fibrillation: Secondary | ICD-10-CM | POA: Diagnosis not present

## 2022-12-25 DIAGNOSIS — Z7901 Long term (current) use of anticoagulants: Secondary | ICD-10-CM | POA: Diagnosis not present

## 2022-12-25 DIAGNOSIS — Z9181 History of falling: Secondary | ICD-10-CM | POA: Diagnosis not present

## 2022-12-25 DIAGNOSIS — N189 Chronic kidney disease, unspecified: Secondary | ICD-10-CM | POA: Diagnosis not present

## 2022-12-25 DIAGNOSIS — H409 Unspecified glaucoma: Secondary | ICD-10-CM | POA: Diagnosis not present

## 2022-12-25 DIAGNOSIS — E1122 Type 2 diabetes mellitus with diabetic chronic kidney disease: Secondary | ICD-10-CM | POA: Diagnosis not present

## 2022-12-25 DIAGNOSIS — I509 Heart failure, unspecified: Secondary | ICD-10-CM | POA: Diagnosis not present

## 2022-12-25 DIAGNOSIS — Z85038 Personal history of other malignant neoplasm of large intestine: Secondary | ICD-10-CM | POA: Diagnosis not present

## 2022-12-25 DIAGNOSIS — J449 Chronic obstructive pulmonary disease, unspecified: Secondary | ICD-10-CM | POA: Diagnosis not present

## 2022-12-25 DIAGNOSIS — Z7984 Long term (current) use of oral hypoglycemic drugs: Secondary | ICD-10-CM | POA: Diagnosis not present

## 2022-12-28 DIAGNOSIS — R531 Weakness: Secondary | ICD-10-CM | POA: Diagnosis not present

## 2022-12-28 DIAGNOSIS — K219 Gastro-esophageal reflux disease without esophagitis: Secondary | ICD-10-CM | POA: Diagnosis not present

## 2022-12-28 DIAGNOSIS — I509 Heart failure, unspecified: Secondary | ICD-10-CM | POA: Diagnosis not present

## 2022-12-28 DIAGNOSIS — J449 Chronic obstructive pulmonary disease, unspecified: Secondary | ICD-10-CM | POA: Diagnosis not present

## 2022-12-28 DIAGNOSIS — R2681 Unsteadiness on feet: Secondary | ICD-10-CM | POA: Diagnosis not present

## 2022-12-31 ENCOUNTER — Ambulatory Visit: Payer: PPO | Attending: Cardiovascular Disease

## 2023-01-02 ENCOUNTER — Encounter (HOSPITAL_COMMUNITY): Payer: Self-pay | Admitting: Vascular Surgery

## 2023-01-03 DIAGNOSIS — N189 Chronic kidney disease, unspecified: Secondary | ICD-10-CM | POA: Diagnosis not present

## 2023-01-03 DIAGNOSIS — Z7901 Long term (current) use of anticoagulants: Secondary | ICD-10-CM | POA: Diagnosis not present

## 2023-01-03 DIAGNOSIS — H409 Unspecified glaucoma: Secondary | ICD-10-CM | POA: Diagnosis not present

## 2023-01-03 DIAGNOSIS — Z7984 Long term (current) use of oral hypoglycemic drugs: Secondary | ICD-10-CM | POA: Diagnosis not present

## 2023-01-03 DIAGNOSIS — I509 Heart failure, unspecified: Secondary | ICD-10-CM | POA: Diagnosis not present

## 2023-01-03 DIAGNOSIS — J449 Chronic obstructive pulmonary disease, unspecified: Secondary | ICD-10-CM | POA: Diagnosis not present

## 2023-01-03 DIAGNOSIS — I4891 Unspecified atrial fibrillation: Secondary | ICD-10-CM | POA: Diagnosis not present

## 2023-01-03 DIAGNOSIS — H547 Unspecified visual loss: Secondary | ICD-10-CM | POA: Diagnosis not present

## 2023-01-03 DIAGNOSIS — Z9181 History of falling: Secondary | ICD-10-CM | POA: Diagnosis not present

## 2023-01-03 DIAGNOSIS — E1122 Type 2 diabetes mellitus with diabetic chronic kidney disease: Secondary | ICD-10-CM | POA: Diagnosis not present

## 2023-01-03 DIAGNOSIS — Z85038 Personal history of other malignant neoplasm of large intestine: Secondary | ICD-10-CM | POA: Diagnosis not present

## 2023-01-08 DIAGNOSIS — H547 Unspecified visual loss: Secondary | ICD-10-CM | POA: Diagnosis not present

## 2023-01-08 DIAGNOSIS — Z85038 Personal history of other malignant neoplasm of large intestine: Secondary | ICD-10-CM | POA: Diagnosis not present

## 2023-01-08 DIAGNOSIS — H409 Unspecified glaucoma: Secondary | ICD-10-CM | POA: Diagnosis not present

## 2023-01-08 DIAGNOSIS — J449 Chronic obstructive pulmonary disease, unspecified: Secondary | ICD-10-CM | POA: Diagnosis not present

## 2023-01-08 DIAGNOSIS — E1122 Type 2 diabetes mellitus with diabetic chronic kidney disease: Secondary | ICD-10-CM | POA: Diagnosis not present

## 2023-01-08 DIAGNOSIS — I4891 Unspecified atrial fibrillation: Secondary | ICD-10-CM | POA: Diagnosis not present

## 2023-01-08 DIAGNOSIS — N189 Chronic kidney disease, unspecified: Secondary | ICD-10-CM | POA: Diagnosis not present

## 2023-01-08 DIAGNOSIS — Z7901 Long term (current) use of anticoagulants: Secondary | ICD-10-CM | POA: Diagnosis not present

## 2023-01-08 DIAGNOSIS — Z9181 History of falling: Secondary | ICD-10-CM | POA: Diagnosis not present

## 2023-01-08 DIAGNOSIS — I509 Heart failure, unspecified: Secondary | ICD-10-CM | POA: Diagnosis not present

## 2023-01-08 DIAGNOSIS — Z7984 Long term (current) use of oral hypoglycemic drugs: Secondary | ICD-10-CM | POA: Diagnosis not present

## 2023-01-13 ENCOUNTER — Encounter: Payer: Self-pay | Admitting: Internal Medicine

## 2023-01-13 NOTE — Patient Instructions (Addendum)
      Your sugars are well controlled.     Medications changes include :   none     Return in about 6 months (around 07/16/2023) for follow up.

## 2023-01-13 NOTE — Progress Notes (Unsigned)
Subjective:    Patient ID: Troy Bruce [name], male    DOB: 19-Dec-1940, 82 y.o.   MRN: [id number]     HPI Troy [name] is here for follow up of his chronic medical problems.  November 23-hospitalized for hypoxia, acute on chronic CHF March 24-fall-ED visit March 24-admitted PAD for abdominal aortogram with lower extremity-AAA increased from 3.7 cm to 4.7 cm in 1 year-ultrasound to be repeated in 6 months.  ABIs without change.    Medications and allergies reviewed with patient and updated if appropriate.  Current Outpatient Medications on File Prior to Visit  Medication Sig Dispense Refill   acetaminophen (TYLENOL) 325 MG tablet Take 650 mg by mouth every 6 (six) hours as needed for moderate pain or headache.     albuterol (VENTOLIN HFA) 108 (90 Base) MCG/ACT inhaler Inhale 2 puffs into the lungs every 6 (six) hours as needed for wheezing or shortness of breath.     amLODipine (NORVASC) 10 MG tablet Take 10 mg by mouth daily.     apixaban (ELIQUIS) 2.5 MG TABS tablet Take 2.5 mg by mouth 2 (two) times daily.     Brinzolamide-Brimonidine (SIMBRINZA) 1-0.2 % SUSP Place 1 drop into both eyes in the morning, at noon, and at bedtime.     Budeson-Glycopyrrol-Formoterol (BREZTRI AEROSPHERE) 160-9-4.8 MCG/ACT AERO Inhale 2 puffs into the lungs in the morning and at bedtime. 10.7 g 0   docusate sodium (COLACE) 100 MG capsule Take 100 mg by mouth 2 (two) times daily as needed for moderate constipation.     escitalopram (LEXAPRO) 10 MG tablet TAKE ONE TABLET BY MOUTH EVERY DAY (Patient taking differently: Take 10 mg by mouth daily.) 28 tablet 5   furosemide (LASIX) 20 MG tablet Take 80 mg by mouth daily.     JARDIANCE 10 MG TABS tablet TAKE ONE TABLET BY MOUTH EVERY DAY BEFORE BREAKFAST. (Patient taking differently: Take 10 mg by mouth every morning.) 30 tablet 11   latanoprost (XALATAN) 0.005 % ophthalmic solution Place 1 drop into both eyes at bedtime.     Melatonin 10 MG TABS Take 20 mg  by mouth at bedtime.     metoprolol succinate (TOPROL-XL) 25 MG 24 hr tablet Take 25 mg by mouth daily.     montelukast (SINGULAIR) 10 MG tablet TAKE ONE TABLET BY MOUTH AT BEDTIME (Patient taking differently: Take 10 mg by mouth daily.) 28 tablet 5   Multiple Vitamins-Minerals (MULTIVITAMIN WITH MINERALS) tablet Take 1 tablet by mouth daily.     omeprazole (PRILOSEC) 20 MG capsule Take 1 capsule (20 mg total) by mouth 2 (two) times daily. (Patient taking differently: Take 20 mg by mouth daily.) 60 capsule 3   Polyethyl Glycol-Propyl Glycol (SYSTANE ULTRA) 0.4-0.3 % SOLN Place 1 drop into both eyes daily as needed (dry eyes).     potassium chloride SA (KLOR-CON M) 20 MEQ tablet TAKE TWO TABLETS BY MOUTH IN THE MORNING AND TAKE ONE TABLET IN THE EVENING (Patient taking differently: Take 40 mEq by mouth daily.) 90 tablet 6   PRESCRIPTION MEDICATION Pt uses CPAP machine at bedtime     rosuvastatin (CRESTOR) 40 MG tablet TAKE ONE TABLET BY MOUTH EVERY DAY (Patient taking differently: Take 40 mg by mouth daily.) 90 tablet 3   senna-docusate (SENOKOT-S) 8.6-50 MG tablet Take 1 tablet by mouth daily.     spironolactone (ALDACTONE) 25 MG tablet Take 25 mg by mouth daily.     No current facility-administered medications on file  prior to visit.     Review of Systems     Objective:  There were no vitals filed for this visit. BP Readings from Last 3 Encounters:  12/20/22 (!) 153/69  12/13/22 (!) 176/78  12/07/22 (!) 144/67   Wt Readings from Last 3 Encounters:  12/20/22 180 lb (81.6 kg)  12/13/22 178 lb (80.7 kg)  12/07/22 180 lb 12.4 oz (82 kg)   There is no height or weight on file to calculate BMI.    Physical Exam     Lab Results  Component Value Date   WBC 5.5 08/29/2022   HGB 13.9 12/20/2022   HCT 41.0 12/20/2022   PLT 259 08/29/2022   GLUCOSE 127 (H) 12/20/2022   CHOL 105 08/29/2022   TRIG 146 08/29/2022   HDL 34 (L) 08/29/2022   LDLDIRECT 41.0 07/11/2021   LDLCALC 42  08/29/2022   ALT 18 08/26/2022   AST 23 08/26/2022   NA 139 12/20/2022   K 4.1 12/20/2022   CL 102 12/20/2022   CREATININE 1.40 (H) 12/20/2022   BUN 16 12/20/2022   CO2 28 09/26/2022   TSH 1.69 01/10/2022   PSA 1.53 01/15/2008   INR 1.8 (H) 07/01/2021   HGBA1C 6.8 (A) 07/11/2022   HGBA1C 6.8 07/11/2022   HGBA1C 6.8 (A) 07/11/2022   HGBA1C 6.8 07/11/2022   MICROALBUR 1.2 01/10/2022     Assessment & Plan:    See Problem List for Assessment and Plan of chronic medical problems.

## 2023-01-14 ENCOUNTER — Ambulatory Visit (INDEPENDENT_AMBULATORY_CARE_PROVIDER_SITE_OTHER): Payer: PPO | Admitting: Internal Medicine

## 2023-01-14 ENCOUNTER — Encounter: Payer: Self-pay | Admitting: Internal Medicine

## 2023-01-14 VITALS — BP 120/76 | HR 60 | Temp 97.9°F | Ht 73.0 in

## 2023-01-14 DIAGNOSIS — I480 Paroxysmal atrial fibrillation: Secondary | ICD-10-CM | POA: Diagnosis not present

## 2023-01-14 DIAGNOSIS — F3289 Other specified depressive episodes: Secondary | ICD-10-CM | POA: Diagnosis not present

## 2023-01-14 DIAGNOSIS — J3089 Other allergic rhinitis: Secondary | ICD-10-CM

## 2023-01-14 DIAGNOSIS — E1351 Other specified diabetes mellitus with diabetic peripheral angiopathy without gangrene: Secondary | ICD-10-CM

## 2023-01-14 DIAGNOSIS — E785 Hyperlipidemia, unspecified: Secondary | ICD-10-CM

## 2023-01-14 DIAGNOSIS — N1831 Chronic kidney disease, stage 3a: Secondary | ICD-10-CM | POA: Diagnosis not present

## 2023-01-14 DIAGNOSIS — E1122 Type 2 diabetes mellitus with diabetic chronic kidney disease: Secondary | ICD-10-CM

## 2023-01-14 DIAGNOSIS — I10 Essential (primary) hypertension: Secondary | ICD-10-CM

## 2023-01-14 LAB — POCT GLYCOSYLATED HEMOGLOBIN (HGB A1C): Hemoglobin A1C: 6.6 % — AB (ref 4.0–5.6)

## 2023-01-14 NOTE — Assessment & Plan Note (Signed)
Chronic Regular exercise and healthy diet encouraged Continue Crestor 40 mg daily 

## 2023-01-14 NOTE — Assessment & Plan Note (Signed)
Chronic Continue montelukast 10 mg nightly 

## 2023-01-14 NOTE — Assessment & Plan Note (Signed)
Chronic Controlled, Stable Continue Lexapro 10 mg daily 

## 2023-01-14 NOTE — Assessment & Plan Note (Signed)
Chronic Following with cardiology Eliquis 2.5 mg twice daily, metoprolol XL 25 mg daily

## 2023-01-14 NOTE — Assessment & Plan Note (Signed)
Chronic Check A1c today-6 0.6% Sugars controlled Continue diet control Encourage diabetic diet, continue regular exercise

## 2023-01-14 NOTE — Assessment & Plan Note (Signed)
Chronic Jardiance 10 mg daily GFR stable

## 2023-01-14 NOTE — Assessment & Plan Note (Signed)
Chronic BP well controlled Continue amlodipine 10 mg daily, metoprolol xl 25 mg daily, spironolactone 25 mg daily

## 2023-01-15 DIAGNOSIS — Z85038 Personal history of other malignant neoplasm of large intestine: Secondary | ICD-10-CM | POA: Diagnosis not present

## 2023-01-15 DIAGNOSIS — Z7984 Long term (current) use of oral hypoglycemic drugs: Secondary | ICD-10-CM | POA: Diagnosis not present

## 2023-01-15 DIAGNOSIS — E1122 Type 2 diabetes mellitus with diabetic chronic kidney disease: Secondary | ICD-10-CM | POA: Diagnosis not present

## 2023-01-15 DIAGNOSIS — J449 Chronic obstructive pulmonary disease, unspecified: Secondary | ICD-10-CM | POA: Diagnosis not present

## 2023-01-15 DIAGNOSIS — N189 Chronic kidney disease, unspecified: Secondary | ICD-10-CM | POA: Diagnosis not present

## 2023-01-15 DIAGNOSIS — Z9181 History of falling: Secondary | ICD-10-CM | POA: Diagnosis not present

## 2023-01-15 DIAGNOSIS — H547 Unspecified visual loss: Secondary | ICD-10-CM | POA: Diagnosis not present

## 2023-01-15 DIAGNOSIS — I4891 Unspecified atrial fibrillation: Secondary | ICD-10-CM | POA: Diagnosis not present

## 2023-01-15 DIAGNOSIS — H409 Unspecified glaucoma: Secondary | ICD-10-CM | POA: Diagnosis not present

## 2023-01-15 DIAGNOSIS — I509 Heart failure, unspecified: Secondary | ICD-10-CM | POA: Diagnosis not present

## 2023-01-15 DIAGNOSIS — Z7901 Long term (current) use of anticoagulants: Secondary | ICD-10-CM | POA: Diagnosis not present

## 2023-01-17 DIAGNOSIS — I4891 Unspecified atrial fibrillation: Secondary | ICD-10-CM | POA: Diagnosis not present

## 2023-01-17 DIAGNOSIS — Z7984 Long term (current) use of oral hypoglycemic drugs: Secondary | ICD-10-CM | POA: Diagnosis not present

## 2023-01-17 DIAGNOSIS — J449 Chronic obstructive pulmonary disease, unspecified: Secondary | ICD-10-CM | POA: Diagnosis not present

## 2023-01-17 DIAGNOSIS — E1122 Type 2 diabetes mellitus with diabetic chronic kidney disease: Secondary | ICD-10-CM | POA: Diagnosis not present

## 2023-01-17 DIAGNOSIS — Z7901 Long term (current) use of anticoagulants: Secondary | ICD-10-CM | POA: Diagnosis not present

## 2023-01-17 DIAGNOSIS — I509 Heart failure, unspecified: Secondary | ICD-10-CM | POA: Diagnosis not present

## 2023-01-17 DIAGNOSIS — N189 Chronic kidney disease, unspecified: Secondary | ICD-10-CM | POA: Diagnosis not present

## 2023-01-17 DIAGNOSIS — H409 Unspecified glaucoma: Secondary | ICD-10-CM | POA: Diagnosis not present

## 2023-01-17 DIAGNOSIS — H547 Unspecified visual loss: Secondary | ICD-10-CM | POA: Diagnosis not present

## 2023-01-17 DIAGNOSIS — Z85038 Personal history of other malignant neoplasm of large intestine: Secondary | ICD-10-CM | POA: Diagnosis not present

## 2023-01-17 DIAGNOSIS — Z9181 History of falling: Secondary | ICD-10-CM | POA: Diagnosis not present

## 2023-01-21 ENCOUNTER — Ambulatory Visit (HOSPITAL_COMMUNITY): Payer: PPO | Attending: Pulmonary Disease

## 2023-01-22 DIAGNOSIS — I4891 Unspecified atrial fibrillation: Secondary | ICD-10-CM | POA: Diagnosis not present

## 2023-01-22 DIAGNOSIS — Z7984 Long term (current) use of oral hypoglycemic drugs: Secondary | ICD-10-CM | POA: Diagnosis not present

## 2023-01-22 DIAGNOSIS — I509 Heart failure, unspecified: Secondary | ICD-10-CM | POA: Diagnosis not present

## 2023-01-22 DIAGNOSIS — H409 Unspecified glaucoma: Secondary | ICD-10-CM | POA: Diagnosis not present

## 2023-01-22 DIAGNOSIS — J449 Chronic obstructive pulmonary disease, unspecified: Secondary | ICD-10-CM | POA: Diagnosis not present

## 2023-01-22 DIAGNOSIS — Z85038 Personal history of other malignant neoplasm of large intestine: Secondary | ICD-10-CM | POA: Diagnosis not present

## 2023-01-22 DIAGNOSIS — H547 Unspecified visual loss: Secondary | ICD-10-CM | POA: Diagnosis not present

## 2023-01-22 DIAGNOSIS — E1122 Type 2 diabetes mellitus with diabetic chronic kidney disease: Secondary | ICD-10-CM | POA: Diagnosis not present

## 2023-01-22 DIAGNOSIS — Z7901 Long term (current) use of anticoagulants: Secondary | ICD-10-CM | POA: Diagnosis not present

## 2023-01-22 DIAGNOSIS — N189 Chronic kidney disease, unspecified: Secondary | ICD-10-CM | POA: Diagnosis not present

## 2023-01-22 DIAGNOSIS — Z9181 History of falling: Secondary | ICD-10-CM | POA: Diagnosis not present

## 2023-01-24 ENCOUNTER — Telehealth: Payer: Self-pay

## 2023-01-24 DIAGNOSIS — G4733 Obstructive sleep apnea (adult) (pediatric): Secondary | ICD-10-CM | POA: Diagnosis not present

## 2023-01-24 NOTE — Telephone Encounter (Signed)
ATC X1 LVM for patient to call the office back. Patient was a no-show for CT on Monday will need to re-schedule apt with Maralyn Sago tomorrow unless he is still wanting to see her

## 2023-01-25 ENCOUNTER — Encounter: Payer: Self-pay | Admitting: Acute Care

## 2023-01-25 ENCOUNTER — Ambulatory Visit: Payer: PPO | Admitting: Acute Care

## 2023-01-25 VITALS — BP 138/80 | HR 63 | Temp 97.8°F | Ht 73.0 in | Wt 182.0 lb

## 2023-01-25 DIAGNOSIS — G4733 Obstructive sleep apnea (adult) (pediatric): Secondary | ICD-10-CM

## 2023-01-25 DIAGNOSIS — J449 Chronic obstructive pulmonary disease, unspecified: Secondary | ICD-10-CM | POA: Diagnosis not present

## 2023-01-25 DIAGNOSIS — Z85038 Personal history of other malignant neoplasm of large intestine: Secondary | ICD-10-CM | POA: Diagnosis not present

## 2023-01-25 DIAGNOSIS — Z85118 Personal history of other malignant neoplasm of bronchus and lung: Secondary | ICD-10-CM

## 2023-01-25 NOTE — Patient Instructions (Signed)
It is good to see you today. We will have the PCC's here today get you scheduled for the CT chest. Please let both the patient [phone]) and Neysa Bonito [phone] know the time and date if the scan . They will notify the transportation and make sure he is at the follow up scan. He will need a follow up appointment with me to review the results after.  Continue CPAP as you have been doing. Your down Load shows excellent compliance and management of your OSA. I am glad you are having a stable time with your COPD Continue your Breztri 2 puffs twice daily. Rinse mouth after use.  Note your daily symptoms > remember "red flags" for COPD:  Increase in cough, increase in sputum production, increase in shortness of breath or activity intolerance. If you notice these symptoms, please call to be seen.  Please contact office for sooner follow up if symptoms do not improve or worsen or seek emergency care.

## 2023-01-25 NOTE — Progress Notes (Signed)
History of Present Illness Troy Wrightson [name] is a 82 y.o. male former smoker followed for COPD, lung nodule, history of squamous cell carcinoma of the right lung April 2021 status post right lower lobectomy at [facility], no chemo no radiation no recurrence and sleep apnea. Medical history significant for A-fib, previous colon cancer which is being monitored closely by medical oncology.   01/25/2023 Pt. Presents for follow up. He is here for what is supposed to be a CT Chest follow up to evaluate the slight progression of the inferior right upper lobe nodule, that was done 07/20/2022. He did not get the CT Chest done, so we will get that scheduled today. He lives at a facility, and they had the appointment information ( confirmed by his transportation today) but they forgot about the appointment. The patient is very angry about this. I told him we will get his CT Chest scheduled, and get him back to the office to review his results.  He is also followed for sleep apnea. I have reviewed his down Load and it shows good control of his sleep apnea with therapy. AHI is 0.7, on set pressure of 10 cm H2O.Compliance is 100%, and average usage is 5 hours and 45 minutes.  He is using his Breztri as prescribed. He has had a stable period with his COPD. NO discolored secretions. No worsening dyspnea.   Test Results: 2017 Mild with AHI overall 6/hr but severe during REM sleep with AHI 30/hr with oxygen saturations < 88% for 5.5 minutes of total sleep time.  On CPAP at 10cm H2O   Stage Ib squamous cell carcinoma of right lower lung s/p bronchoscopy, mediastinoscopy, right thoracoscopic lower lobectomy 01/24/2011 -Path poorly differentiated squamous cell carcinoma with positive vascular invasion LN stations 4R, 4L, 7, 8, 9, 10, 11, and 12 w/o evidence of malignancy  - pT2a,pN0,pMX stage IB  -managed and monitored per Dr. Ewing Schlein at [facility]. Last Ct chest 05/01/18 stable, without concerning evidence of recurrence  -CT  06/25/2018- nonspecific small bilateral nodules, enlarging superior right paratracheal node -EUS biopsy of the right paratracheal nodes (several rounded nodes measuring 4 cm in total) on 07/10/2018- negative for malignancy -CT chest 03/03/2019- stable postoperative appearance of the chest, unchanged mildly prominent right paratracheal lymph node, no evidence of metastatic disease -CT chest 01/14/2020-stable surgical changes in the right chest, stable mild mediastinal and right hilar adenopathy -CT chest 01/24/2021- mild interval growth of irregular nodular opacities in the right upper lobe along the minor fissure -PET 02/08/2021-band of hypermetabolic nodules in the inferior aspect of the right upper lobe-indeterminate, nodularity increased over time.  Hypermetabolic nodule in the right upper mediastinum, similar small right effusion -CT super D chest 03/21/2021-consolidation and nodularity in the posterior inferior right upper lobe slightly diminished, unchanged loculated small right effusion, fine clustered centrilobular nodularity concentrated in the left upper lobe-unchanged.  No enlarged mediastinal, hilar, or axillary nodes -Right pleural fluid 07/05/2021-reactive mesothelial cells -CT super D chest 09/12/2021-decrease in size of right paratracheal and left mediastinal nodes, stable small right pleural effusion, right lower lobectomy, similar soft tissue thickening in the inferior right upper lobe, upper lobe predominant centrilobular nodularity favored to represent bronchiolitis -CT super D chest 04/09/2022-increased nodular thickening at the minor fissure, increased masslike density in the inferior right upper lobe, stable right pleural effusion -PET 04/24/2022-enlarging bandlike opacity in the inferior right upper lobe low-level hypermetabolic activity-slightly increased from the previous PET, no hypermetabolic activity in the anterior nodularity, no evidence of metastatic disease,  stable chronic loculated  right pleural effusion -CT super D chest 07/20/2022-unchanged appearance of tracer avid bandlike opacity in the inferior right upper lobe-indeterminant, nodular area at the anterior basal right upper lobe unchanged CT Chest ordered 07/26/2022 that was due 11/2022 has not been completed.       Latest Ref Rng & Units 12/20/2022    9:35 AM 08/29/2022    3:41 AM 08/26/2022    2:15 AM  CBC  WBC 4.0 - 10.5 K/uL  5.5  6.3   Hemoglobin 13.0 - 17.0 g/dL 19.1  47.8  29.5   Hematocrit 39.0 - 52.0 % 41.0  39.2  37.9   Platelets 150 - 400 K/uL  259  227        Latest Ref Rng & Units 12/20/2022    9:35 AM 09/26/2022   11:30 AM 08/29/2022    3:41 AM  BMP  Glucose 70 - 99 mg/dL 621  308  657   BUN 8 - 23 mg/dL 16  14  41   Creatinine 0.61 - 1.24 mg/dL 8.46  9.62  9.52   Sodium 135 - 145 mmol/L 139  138  132   Potassium 3.5 - 5.1 mmol/L 4.1  4.1  4.1   Chloride 98 - 111 mmol/L 102  101  99   CO2 22 - 32 mmol/L  28  22   Calcium 8.9 - 10.3 mg/dL  8.9  8.6     BNP    Component Value Date/Time   BNP 256.7 (H) 08/24/2022 0137   BNP 42.6 09/13/2016 1700    ProBNP    Component Value Date/Time   PROBNP 279.0 (H) 07/14/2018 1716    PFT    Component Value Date/Time   FEV1PRE 1.98 08/13/2017 1500   FEV1POST 2.10 10/04/2016 1103   FVCPRE 3.10 08/13/2017 1500   FVCPOST 3.26 10/04/2016 1103   TLC 7.39 08/13/2017 1500   DLCOUNC 12.40 08/13/2017 1500   PREFEV1FVCRT 64 08/13/2017 1500   PSTFEV1FVCRT 64 10/04/2016 1103    No results found.   Past medical hx Past Medical History:  Diagnosis Date   AAA (abdominal aortic aneurysm) (HCC) 10-20-17   MONITORED BY DR Edilia Bo, 3.7 CM by ABDOMINAL US 11/2021   Anxiety    Arthritis    Atrial fibrillation (HCC)    Basal cell carcinoma    BPH (benign prostatic hypertrophy)    Chronic diastolic heart failure (HCC)    Chronic kidney disease    stage 3, pt unaware   Colon cancer (HCC)    COPD (chronic obstructive pulmonary disease) (HCC)     Depression    GERD (gastroesophageal reflux disease)    Glaucoma BOTH EYES   Dr Nile Riggs   History of lung cancer APRIL 2012   ONCOLOGIST- DR Mariel Sleet  LOV [facility] 10-27-2012 SQUAMOUS CELL---- S/P RIGHT LOWER LOBECTOMY AT [facility] -- NO CHEMORADIATION--- NO RECURRENCE   Hx of adenomatous colonic polyps 2005    X 2; 1 hyperplastic polyp; Dr Juanda Chance   Hyperlipidemia    Hypertension    OSA (obstructive sleep apnea) 08/29/2015   CPAP SET ON 10   Peripheral vascular disease (HCC)    FOLLOWED  BY DR DICKSON   Spinal stenosis 06/2014   Thoracic aorta atherosclerosis (HCC)    Thyrotoxicosis    amiodarone induced     Social History   Tobacco Use   Smoking status: Former    Packs/day: 0.50    Years: 50.00    Additional pack years:  0.00    Total pack years: 25.00    Types: Cigarettes    Quit date: 07/28/2014    Years since quitting: 8.5    Passive exposure: Never   Smokeless tobacco: Never  Vaping Use   Vaping Use: Never used  Substance Use Topics   Alcohol use: Yes    Alcohol/week: 4.0 standard drinks of alcohol    Types: 1 Glasses of wine, 1 Cans of beer, 1 Shots of liquor, 1 Standard drinks or equivalent per week    Comment: socially, variable; moderately heavy use in the past   Drug use: No    Mr.Cunanan reports that he quit smoking about 8 years ago. His smoking use included cigarettes. He has a 25.00 pack-year smoking history. He has never been exposed to tobacco smoke. He has never used smokeless tobacco. He reports current alcohol use of about 4.0 standard drinks of alcohol per week. He reports that he does not use drugs.  Tobacco Cessation: Former smoker with a 25 pack year smoking history, quit 2015   Past surgical hx, Family hx, Social hx all reviewed.  Current Outpatient Medications on File Prior to Visit  Medication Sig   acetaminophen (TYLENOL) 325 MG tablet Take 650 mg by mouth every 6 (six) hours as needed for moderate pain or headache.   albuterol (VENTOLIN HFA)  108 (90 Base) MCG/ACT inhaler Inhale 2 puffs into the lungs every 6 (six) hours as needed for wheezing or shortness of breath.   amLODipine (NORVASC) 10 MG tablet Take 10 mg by mouth daily.   apixaban (ELIQUIS) 2.5 MG TABS tablet Take 2.5 mg by mouth 2 (two) times daily.   Brinzolamide-Brimonidine (SIMBRINZA) 1-0.2 % SUSP Place 1 drop into both eyes in the morning, at noon, and at bedtime.   Budeson-Glycopyrrol-Formoterol (BREZTRI AEROSPHERE) 160-9-4.8 MCG/ACT AERO Inhale 2 puffs into the lungs in the morning and at bedtime.   docusate sodium (COLACE) 100 MG capsule Take 100 mg by mouth 2 (two) times daily as needed for moderate constipation.   escitalopram (LEXAPRO) 10 MG tablet TAKE ONE TABLET BY MOUTH EVERY DAY (Patient taking differently: Take 10 mg by mouth daily.)   furosemide (LASIX) 20 MG tablet Take 80 mg by mouth daily.   JARDIANCE 10 MG TABS tablet TAKE ONE TABLET BY MOUTH EVERY DAY BEFORE BREAKFAST. (Patient taking differently: Take 10 mg by mouth every morning.)   latanoprost (XALATAN) 0.005 % ophthalmic solution Place 1 drop into both eyes at bedtime.   Melatonin 10 MG TABS Take 20 mg by mouth at bedtime.   metoprolol succinate (TOPROL-XL) 25 MG 24 hr tablet Take 25 mg by mouth daily.   montelukast (SINGULAIR) 10 MG tablet TAKE ONE TABLET BY MOUTH AT BEDTIME (Patient taking differently: Take 10 mg by mouth daily.)   Multiple Vitamins-Minerals (MULTIVITAMIN WITH MINERALS) tablet Take 1 tablet by mouth daily.   omeprazole (PRILOSEC) 20 MG capsule Take 1 capsule (20 mg total) by mouth 2 (two) times daily. (Patient taking differently: Take 20 mg by mouth daily.)   Polyethyl Glycol-Propyl Glycol (SYSTANE ULTRA) 0.4-0.3 % SOLN Place 1 drop into both eyes daily as needed (dry eyes).   potassium chloride SA (KLOR-CON M) 20 MEQ tablet TAKE TWO TABLETS BY MOUTH IN THE MORNING AND TAKE ONE TABLET IN THE EVENING (Patient taking differently: Take 40 mEq by mouth daily.)   PRESCRIPTION MEDICATION Pt  uses CPAP machine at bedtime   rosuvastatin (CRESTOR) 40 MG tablet TAKE ONE TABLET BY MOUTH EVERY DAY (Patient  taking differently: Take 40 mg by mouth daily.)   senna-docusate (SENOKOT-S) 8.6-50 MG tablet Take 1 tablet by mouth daily.   spironolactone (ALDACTONE) 25 MG tablet Take 25 mg by mouth daily.   No current facility-administered medications on file prior to visit.     Allergies  Allergen Reactions   Niacin-Lovastatin Er Shortness Of Breath and Other (See Comments)    Dyspnea Flushing  *Not listed on MAR    Penicillins Hives, Shortness Of Breath and Other (See Comments)    Dyspnea Flushing Because of a history of documented adverse serious drug reaction;Medi Alert bracelet  is recommended PCN reaction causing immediate rash, facial/tongue/throat swelling, SOB or lightheadedness with hypotension: Yes PCN reaction causing severe rash involving mucus membranes or skin necrosis: No PCN reaction occurring within the last 10 years: NO PCN reaction that required hospitalization: NO   Sulfonamide Derivatives Hives, Shortness Of Breath and Other (See Comments)    Dyspnea  Flushing Because of a history of documented adverse serious drug reaction;Medi Alert bracelet  is recommended   Lipitor [Atorvastatin] Other (See Comments)    Myalgias Arthralgias   *Not listed on MAR     Review Of Systems:  Constitutional:   No  weight loss, night sweats,  Fevers, chills, fatigue, or  lassitude.  HEENT:   No headaches,  Difficulty swallowing,  Tooth/dental problems, or  Sore throat,                No sneezing, itching, ear ache, nasal congestion, post nasal drip,   CV:  No chest pain,  Orthopnea, PND, swelling in lower extremities, anasarca, dizziness, palpitations, syncope.   GI  No heartburn, indigestion, abdominal pain, nausea, vomiting, diarrhea, change in bowel habits, loss of appetite, bloody stools.   Resp: No shortness of breath with exertion or at rest.  No excess mucus, no  productive cough,  No non-productive cough,  No coughing up of blood.  No change in color of mucus.  No wheezing.  No chest wall deformity  Skin: no rash or lesions.  GU: no dysuria, change in color of urine, no urgency or frequency.  No flank pain, no hematuria   MS:  No joint pain or swelling.  No decreased range of motion.  No back pain.  Psych:  No change in mood or affect. No depression or anxiety.  No memory loss.   Vital Signs BP 138/80 (BP Location: Right Arm, Patient Position: Sitting, Cuff Size: Normal)   Pulse 63   Temp 97.8 F (36.6 C) (Oral)   Ht 6\' 1"  (1.854 m)   Wt 182 lb (82.6 kg)   SpO2 96%   BMI 24.01 kg/m    Physical Exam:  General- No distress,  A&Ox3, pleasant but anygry he missed his CT Chest. ENT: No sinus tenderness, TM clear, pale nasal mucosa, no oral exudate,no post nasal drip, no LAN Cardiac: S1, S2, regular rate and rhythm, no murmur Chest: No wheeze/ rales/ dullness; no accessory muscle use, no nasal flaring, no sternal retractions Abd.: Soft Non-tender, ND, BS +, Body mass index is 24.01 kg/m.  Ext: No clubbing cyanosis, edema Neuro:  Physically deconditioned, In wheelchair, Alert and oriented x 3, MAE x 4, appropriate Skin: No rashes, warm and dry, no lesions  Psych: normal mood and behavior   Assessment/Plan Stage Ib squamous cell carcinoma of right lower lung s/p bronchoscopy, mediastinoscopy, right thoracoscopic lower lobectomy 01/24/2011 Surveillance CT with concern of some progressive changes, follow up CT chest due 11/2022 (  Patient missed this scan) Plan We will have the PCC's here today get you scheduled for the CT chest. Please let both the patient [phone]) and Neysa Bonito [phone] know the time and date if the scan . They will notify the transportation and make sure he is at the follow up scan. He will need a follow up appointment with me to review the results after.  Continue CPAP as you have been doing.  OSA on CPAP Set  Pressure of 10 cm H2O Plan Your down Load shows excellent compliance and management of your OSA. Continue on CPAP at bedtime. You appear to be benefiting from the treatment  Goal is to wear for at least 6 hours each night for maximal clinical benefit. Continue to work on weight loss, as the link between excess weight  and sleep apnea is well established.   Remember to establish a good bedtime routine, and work on sleep hygiene.  Limit daytime naps , avoid stimulants such as caffeine and nicotine close to bedtime, exercise daily to promote sleep quality, avoid heavy , spicy, fried , or rich foods before bed. Ensure adequate exposure to natural light during the day,establish a relaxing bedtime routine with a pleasant sleep environment ( Bedroom between 60 and 67 degrees, turn off bright lights , TV or device screens screens , consider black out curtains or white noise machines) Do not drive if sleepy. Remember to clean mask, tubing, filter, and reservoir once weekly with soapy water.  Follow up  In  6 months Dr. Craige Cotta , or before as needed.     COPD Stable Interval I am glad you are having a stable time with your COPD Continue your Breztri 2 puffs twice daily. Rinse mouth after use.  Note your daily symptoms > remember "red flags" for COPD:  Increase in cough, increase in sputum production, increase in shortness of breath or activity intolerance. If you notice these symptoms, please call to be seen.  Please contact office for sooner follow up if symptoms do not improve or worsen or seek emergency care.     I spent 30 minutes dedicated to the care of this patient on the date of this encounter to include pre-visit review of records, face-to-face time with the patient discussing conditions above, post visit ordering of testing, clinical documentation with the electronic health record, making appropriate referrals as documented, and communicating necessary information to the patient's healthcare team.     Bevelyn Ngo, NP 01/25/2023  1:29 PM

## 2023-01-28 DIAGNOSIS — J449 Chronic obstructive pulmonary disease, unspecified: Secondary | ICD-10-CM | POA: Diagnosis not present

## 2023-01-28 DIAGNOSIS — K219 Gastro-esophageal reflux disease without esophagitis: Secondary | ICD-10-CM | POA: Diagnosis not present

## 2023-01-28 DIAGNOSIS — R2681 Unsteadiness on feet: Secondary | ICD-10-CM | POA: Diagnosis not present

## 2023-01-28 DIAGNOSIS — R531 Weakness: Secondary | ICD-10-CM | POA: Diagnosis not present

## 2023-01-28 DIAGNOSIS — I509 Heart failure, unspecified: Secondary | ICD-10-CM | POA: Diagnosis not present

## 2023-01-29 DIAGNOSIS — N189 Chronic kidney disease, unspecified: Secondary | ICD-10-CM | POA: Diagnosis not present

## 2023-01-29 DIAGNOSIS — J449 Chronic obstructive pulmonary disease, unspecified: Secondary | ICD-10-CM | POA: Diagnosis not present

## 2023-01-29 DIAGNOSIS — H547 Unspecified visual loss: Secondary | ICD-10-CM | POA: Diagnosis not present

## 2023-01-29 DIAGNOSIS — H409 Unspecified glaucoma: Secondary | ICD-10-CM | POA: Diagnosis not present

## 2023-01-29 DIAGNOSIS — I4891 Unspecified atrial fibrillation: Secondary | ICD-10-CM | POA: Diagnosis not present

## 2023-01-29 DIAGNOSIS — E1122 Type 2 diabetes mellitus with diabetic chronic kidney disease: Secondary | ICD-10-CM | POA: Diagnosis not present

## 2023-01-29 DIAGNOSIS — Z7984 Long term (current) use of oral hypoglycemic drugs: Secondary | ICD-10-CM | POA: Diagnosis not present

## 2023-01-29 DIAGNOSIS — Z9181 History of falling: Secondary | ICD-10-CM | POA: Diagnosis not present

## 2023-01-29 DIAGNOSIS — Z85038 Personal history of other malignant neoplasm of large intestine: Secondary | ICD-10-CM | POA: Diagnosis not present

## 2023-01-29 DIAGNOSIS — I509 Heart failure, unspecified: Secondary | ICD-10-CM | POA: Diagnosis not present

## 2023-01-29 DIAGNOSIS — Z7901 Long term (current) use of anticoagulants: Secondary | ICD-10-CM | POA: Diagnosis not present

## 2023-01-31 ENCOUNTER — Encounter (HOSPITAL_COMMUNITY): Payer: Self-pay | Admitting: Cardiology

## 2023-01-31 ENCOUNTER — Ambulatory Visit (HOSPITAL_COMMUNITY)
Admission: RE | Admit: 2023-01-31 | Discharge: 2023-01-31 | Disposition: A | Discharge status: Home / Self Care | Payer: PPO | Source: Ambulatory Visit | Attending: Cardiology | Admitting: Cardiology

## 2023-01-31 VITALS — BP 140/60 | HR 63 | Wt 183.0 lb

## 2023-01-31 DIAGNOSIS — C187 Malignant neoplasm of sigmoid colon: Secondary | ICD-10-CM | POA: Insufficient documentation

## 2023-01-31 DIAGNOSIS — I130 Hypertensive heart and chronic kidney disease with heart failure and stage 1 through stage 4 chronic kidney disease, or unspecified chronic kidney disease: Secondary | ICD-10-CM | POA: Insufficient documentation

## 2023-01-31 DIAGNOSIS — Z7901 Long term (current) use of anticoagulants: Secondary | ICD-10-CM | POA: Diagnosis not present

## 2023-01-31 DIAGNOSIS — I4819 Other persistent atrial fibrillation: Secondary | ICD-10-CM | POA: Diagnosis not present

## 2023-01-31 DIAGNOSIS — Z79899 Other long term (current) drug therapy: Secondary | ICD-10-CM | POA: Diagnosis not present

## 2023-01-31 DIAGNOSIS — Z85118 Personal history of other malignant neoplasm of bronchus and lung: Secondary | ICD-10-CM | POA: Insufficient documentation

## 2023-01-31 DIAGNOSIS — I739 Peripheral vascular disease, unspecified: Secondary | ICD-10-CM | POA: Diagnosis not present

## 2023-01-31 DIAGNOSIS — I714 Abdominal aortic aneurysm, without rupture, unspecified: Secondary | ICD-10-CM | POA: Diagnosis not present

## 2023-01-31 DIAGNOSIS — Z7984 Long term (current) use of oral hypoglycemic drugs: Secondary | ICD-10-CM | POA: Diagnosis not present

## 2023-01-31 DIAGNOSIS — I480 Paroxysmal atrial fibrillation: Secondary | ICD-10-CM

## 2023-01-31 DIAGNOSIS — N183 Chronic kidney disease, stage 3 unspecified: Secondary | ICD-10-CM | POA: Diagnosis not present

## 2023-01-31 DIAGNOSIS — E785 Hyperlipidemia, unspecified: Secondary | ICD-10-CM | POA: Insufficient documentation

## 2023-01-31 DIAGNOSIS — I2510 Atherosclerotic heart disease of native coronary artery without angina pectoris: Secondary | ICD-10-CM | POA: Insufficient documentation

## 2023-01-31 DIAGNOSIS — Z902 Acquired absence of lung [part of]: Secondary | ICD-10-CM | POA: Diagnosis not present

## 2023-01-31 DIAGNOSIS — I5032 Chronic diastolic (congestive) heart failure: Secondary | ICD-10-CM | POA: Diagnosis not present

## 2023-01-31 LAB — CBC
HCT: 42.9 % (ref 39.0–52.0)
Hemoglobin: 14.5 g/dL (ref 13.0–17.0)
MCH: 29.9 pg (ref 26.0–34.0)
MCHC: 33.8 g/dL (ref 30.0–36.0)
MCV: 88.5 fL (ref 80.0–100.0)
Platelets: 329 10*3/uL (ref 150–400)
RBC: 4.85 MIL/uL (ref 4.22–5.81)
RDW: 14.4 % (ref 11.5–15.5)
WBC: 5.8 10*3/uL (ref 4.0–10.5)
nRBC: 0 % (ref 0.0–0.2)

## 2023-01-31 LAB — BASIC METABOLIC PANEL
Anion gap: 9 (ref 5–15)
BUN: 19 mg/dL (ref 8–23)
CO2: 25 mmol/L (ref 22–32)
Calcium: 9.2 mg/dL (ref 8.9–10.3)
Chloride: 100 mmol/L (ref 98–111)
Creatinine, Ser: 1.48 mg/dL — ABNORMAL HIGH (ref 0.61–1.24)
GFR, Estimated: 47 mL/min — ABNORMAL LOW (ref >60)
Glucose, Bld: 154 mg/dL — ABNORMAL HIGH (ref 70–99)
Potassium: 3.8 mmol/L (ref 3.5–5.1)
Sodium: 134 mmol/L — ABNORMAL LOW (ref 135–145)

## 2023-01-31 LAB — TSH: TSH: 2.272 u[IU]/mL (ref 0.350–4.500)

## 2023-01-31 NOTE — Patient Instructions (Signed)
No Medication changes    Lab Work:  Labs done today, your results will be available in MyChart, we will contact you for abnormal readings.    Follow-Up in:   Your physician recommends that you schedule a follow-up appointment in: 3 months    Do the following things EVERYDAY: Weigh yourself in the morning before breakfast. Write it down and keep it in a log. Take your medicines as prescribed Eat low salt foods--Limit salt (sodium) to 2000 mg per day.  Stay as active as you can everyday Limit all fluids for the day to less than 2 liters    Need to Contact us:  If you have any questions or concerns before your next appointment please send [name] a message through [place] or call our office at [phone].    TO LEAVE A MESSAGE FOR THE NURSE SELECT OPTION 2, PLEASE LEAVE A MESSAGE INCLUDING: YOUR NAME DATE OF BIRTH CALL BACK NUMBER REASON FOR CALL**this is important as we prioritize the call backs  YOU WILL RECEIVE A CALL BACK THE SAME DAY AS LONG AS YOU CALL BEFORE 4:00 PM   At the [facility], you and your health needs are our priority. As part of our continuing mission to provide you with exceptional heart care, we have created designated Provider Care Teams. These Care Teams include your primary Cardiologist (physician) and Advanced Practice Providers (APPs- Physician Assistants and Nurse Practitioners) who all work together to provide you with the care you need, when you need it.   You may see any of the following providers on your designated Care Team at your next follow up: Dr Arvilla Meres Dr Marca Ancona Dr. Marcos Eke, NP Robbie Lis, [place] [facility] [place], [place] Brynda Peon, NP Karle Plumber, PharmD   Please be sure to bring in all your medications bottles to every appointment.    Thank you for choosing [facility]

## 2023-02-01 NOTE — Progress Notes (Signed)
Patient ID: Troy Mintzer [name], male   DOB: 1940/11/15, 82 y.o.   MRN: [id number] PCP: Dr. Lawerance Bach Cardiology: Dr. Shirlee Latch  Mr Rutigliano 82 y.o. with history of HTN, COPD, active smoking/COPD, carotid stenosis s/p right CEA, paroxysmal atrial fibrillation, and PAD.   He does not have known obstructive CAD but is at high risk for CAD based on his comorbidities.  Lexiscan Cardiolite in 3/14 showed no ischemia or infarction and echo in 3/14 showed normal EF.  He had a left fem-pop bypass at the [facility] in the '90s.  He is followed at [facility] for PAD. He has chronic right calf/thigh/buttocks claudication that is unchanged over the last few years and follows regularly at [facility].  He has had right lower lobectomy for lung cancer.  He continues to stay off cigarettes.    At a prior appointment, he was in atrial fibrillation but did not realize it. Dr Shirlee Latch  started him on Eliquis and diltiazem CD, and he spontaneously converted to NSR.  In 5/15, he was at [facility] and felt "strange" one day: fatigued, weak, short of breath.  He went to the ER and was in atrial fibrillation with HR in 80s-90s.  He spontaneously converted to NSR in the ER.  He felt back to normal after converting to NSR.  Started on Multaq 400 mg bid.  With CHF, he was eventually transitioned over to amiodarone.   He had patch angioplasty revision of left fem-pop bypass in 11/16.  Now with minimal claudication.  He follows with [facility].     He had a Cardiolite and echo in 7/17 that were unremarkable.  Repeat Cardiolite in 12/17 showed no ischemia, echo was uninterpretable.  Cardiac MRI was therefore done in 1/18, showing EF 66% with normal-appearing RV, no late gadolinium enhancement.   Given increased exertional dyspnea and a defect on Cardiolite, he had LHC in 11/18.  This showed no obstructive CAD. He had a chest CT done given concern for possible amiodarone lung toxicity with increased dyspnea. This showed emphysema, no ILD.    In 5/19, he  developed a probable viral syndrome with dehydration and had a presyncopal episode.  He was orthostatic in the hospital.  He was noted to be in atrial fibrillation this admission which remained persistent.  He had a TEE-guided DCCV back to NSR.   In 9/19, he was admitted with GI bleeding and found to have a sigmoid mass on colonoscopy, path showed sigmoid adenocarcinoma.  He was noted to have right paratracheal lymphadenopathy but EUS with biopsy in 10/19 did not show malignancy.  He had surgical resection of colon cancer.   Amiodarone was stopped with elevated ESR and increased dyspnea.  He was also found to have hyperthyroidism. He was treated with methimazole by endocrinology.    He had atrial fibrillation ablation in 7/20.   Echo in 4/21 showed EF 60-65%, normal RV.    In 5/21, due to high atrial fibrillation burden, he had redo atrial fibrillation ablation.   Cardiolite in 6/21 showed EF 70%, no ischemia/infarction.   He was admitted in 10/22 with COVID-19 and COPD exacerbation.  He had a thoracentesis, cytology was negative for cancerous cells.  Echo in 10/22 showed EF 55-60%, moderate LVH, mildly decreased RV systolic function.   He was found to have choledocholithiasis with elevated LFTs, had ERCP with sphincterotomy in 5/23, refused cholecystectomy.  He was also admitted in 5/23 with upper GI bleeding.  He was found to have a duodenal ulcer  on EGD.   Follow up 8/23, found to be in atrial fibrillation with slow ventricular rate. Mildly volume overloaded and instructed to increase Lasix to 80 bid x 3 days then Lasix 80/60. S/p DCCV to NSR on 07/10/22. SCr noted to be 2.19, Lasix decreased to 60 bid and losartan stopped. Eliquis dose decreased to 2.5 bid.  Admitted 11/23 with AECOPD, CAP, and A/C HFpEF. Treated with antibiotics, nebs, and steroids. Echo with EF 50-55%, RV normal, IVC dilated.  Diuresed with IV lasix and transitioned to lasix 80 mg po twice a day. Discharged to [facility].   Peripheral angiogram was done in 3/24, diffuse disease but no intervention.   Today he returns for HF follow up.  He is now living at [facility] assisted living.  Weight up about 6 lbs. He is working with a Psychologist, educational twice a week and is also getting PT.  He has a walker for shorter distances and uses a wheelchair for longer distances.  No dyspnea walking in his room, he does get short of breath after walking about 50 feet.  No orthopnea/PND.  No chest pain.  No lightheadedness.   ECG (Personally reviewed): NSR, 1st degree AVB, RBBB   Labs (3/13): LDL 75, HDL 35, K 4.1, creatinine 1.1 Labs (3/14): LDL 91, HDL 27 Labs (10/14): K 4.3, creatinine 0.98 Labs (4/15): K 4.9, creatinine 1.1, LDL 76, HDL 30 Labs (5/15): K 4.3, creatinine 1.7, BNP 251 Labs (6/15): K 4.4, creatinine 1.3 Labs (10/15): TSH normal Labs (5/16): K 4.2, creatinine 1.12, HCT 39.5, LFTs normal, LDL 84, HDL 43, TSH normal Labs (11/16): creatinine 0.94 Labs (4/17): LDL 39, HDL 39 Labs (6/17): K 4.5, creatinine 1.2 Labs (9/17): K 4, creatinine 1.25, HCT 38.7 Labs (12/17): K 4.5, creatinine 1.49 => 1.62, BNP 43 Labs (2/18): K 3.8, creatinine 1.33, LFTs normal, TSH normal Labs (6/18): K 4.3, creatinine 1.49, LFTs normal, TSH normal, hgb 12.3 Labs (11/18): ESR 60, TSH normal, LFTs normal, creatinine 1.34 Labs (1/19): LDL 50 Labs (5/19): K 4.6, creatinine 1.55, LFTs normal, hgb 12.7, LFTs normal Labs (10/19): K 4 => 4.1, creatinine 1.39 => 1.43, BNP 279, hgb 10.7, TSH low, free T4 and free T3 low, ESR 108 Labs (3/21): K 4.5, creatinine 1.19, TSH normal Labs (4/21): K 4.8, creatinine 1.28, BNP 524, LDL 59 Labs (7/21): hgb 11.5, creatinine 1.4 Labs (8/21): K 4.3, creatinine 1.09 Labs (9/21): hgb 12.7 Labs (2/22): K 5, creatinine 1.54 Labs (5/22): K 4.2, creatinine 1.38, hgb 14.3, LDL 31 Labs (10/22): LDL 41, TGs 351, hgb 13.4, K 4.3, creatinine 1.57 Labs (1122): K 3.6, creatinine 1.32 Labs (1/23): K 4.0, creatinine 1.31,  LDL 34 Labs (4/23): K 4.2, creatinine 1.66, LFTs normal, hgb 13.3, TSH normal Labs (5/23): K 3.2, creatinine 1.49 Labs (7/23): hgb 12 Labs (8/23): K 4.3, creatinine 1.76 Labs (9/23): hgb 12.5 Labs (10/23): K 4.5, creatinine 1.96 => 1.51 Labs (08/29/22): K 4.1 Creatinine 1.54, LDL 42, TGs 146 Labs (12/23): K 4.1, creatinine 1.58  PMH: 1. HTN: Fatigue and cough with ramipril use.  2. COPD: Quit smoking 2014. PFTs (1/18) with moderately severe COPD.  - CT chest (11/18): Emphysema noted, no ILD.  3. AAA: CT 1/13 with 3.0 cm AAA.  Abdominal US (1/14) with 3.4 cm AAA. Abdominal US (4/15) with 3.25 x 3.27 AAA. Abdominal US (3/16) with 3.7 cm AAA.  Followed at [facility].  - Abd [name] (1/19): 3.3 cm AAA. 4. Squamous cell lung cancer diagnosed 2/12.  Had right lower lobectomy  in 4/12.  5. Hyperlipidemia 6. PAD: Left fem-pop bypass 1994.  ABIs (2/12) 0.62 on right, 0.95 on left. ABIs (1/14): 0.65 on right, 1.04 on left. ABIs (4/15) 0.66 right, 0.87 left.  ABIs (3/16) 0.6 right, 0.88 left.  Patch angioplasty left fem-pop bypass in 11/16.   - Left fem-pop bypass patent on doppler evaluation in 12/17.  - ABIs (2/21): normal on left (1.02), moderately decreased on right (0.54).  - Peripheral arterial dopplers (3/23): >50% right iliac stenosis, ABIs 0.58 on right and 1.11 on left.  - Peripheral angiogram (3/24): Diffuse disease, no intervention.  7. CAD: Stress myoview 2004 was normal. Lexiscan Cardiolite (3/14) with EF 66%, no ischemia or infarction. Lexiscan Cardiolite (7/17) with EF 61%, no ischemia/infarction.  - Cardiolite (12/17) with EF 62%, inferior/inferolateral fixed defect, most likely diaphragmatic attenuation, no ischemia.  - Cardiolite (9/18) with EF > 65%, fixed inferior defect (attenuation versus infarction), no ischemia.  - LHC (11/18): No obstructive CAD.  - Cardiolite (6/21): EF 70%, no ischemia/infarction 8. Chronic diastolic CHF: Echo (3/14): technically difficult with EF 55-60%, upper normal  RV size.  Echo (5/16) with EF 60-65%.  Echo (7/17) with EF 55-60%, normal RV size and systolic function.  - Echo 12/17 with very poor windows, unable to comment on LV or RV function.  - Cardiac MRI (1/18) with EF 66%, normal RV size and systolic function.  - TEE (5/19): EF 55-60%, normal RV size and systolic function.  - Echo (4/21): EF 60-65%, normal RV - Echo (10/22): EF 55-60%, moderate LVH, mildly decreased RV systolic function - Echo (11/23): EF 50-55%, RV normal, IVC dilated  9. Carotid stenosis: TIA 10/14.  Carotid dopplers with > 80% RICA, 60-79% LICA.  Patient had right CEA in 10/14. Carotids (4/15) patent right CEA, LICA 40-59% stenosis.  Carotids (16/10) < 40% LICA, patent right CEA.  - Carotid dopplers (12/17): right CEA ok, < 39% LICA stenosis.  - Carotid dopplers (1/19): Right CEA ok, 1-39% LICA stenosis.  - Carotid dopplers (2/21): Right CEA ok, LICA 40-59% stenosis.  - Carotid dopplers (3/23): Right CEA ok, LICA 1-39% 10. Atrial fibrillation: Paroxysmal. First noted after lobectomy in 4/12 (brief), recurrence in 4/15 then in 5/15.  - TEE-guided DCCV for persistent atrial fibrillation in 5/19.  - Atrial fibrillation ablation 7/20.  - Re-do atrial fibrillation ablation 5/21.  - DCCV 10/23 11. Spinal stenosis 12. OSA: Using CPAP.  13. Glaucoma 14. Has ILR 15. Colon cancer: Sigmoid adenocarcinoma, s/p surgical resection.  16. 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  17. COVID-19 10/22 18. Choledocholithiasis 19. Upper GI bleeding in 5/23 from duodenal ulcer.   SH: Married, lives in [place].  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.  Quit smoking in 2014.  Rare ETOH now.   FH: No premature CAD  ROS: All systems reviewed and negative except as per HPI.   Current Outpatient Medications  Medication Sig Dispense Refill   acetaminophen (TYLENOL) 325 MG tablet Take 650 mg by mouth every 6 (six) hours as needed for moderate pain or headache.     albuterol (VENTOLIN  HFA) 108 (90 Base) MCG/ACT inhaler Inhale 2 puffs into the lungs every 6 (six) hours as needed for wheezing or shortness of breath.     amLODipine (NORVASC) 10 MG tablet Take 10 mg by mouth daily.     apixaban (ELIQUIS) 2.5 MG TABS tablet Take 2.5 mg by mouth 2 (two) times daily.     Brinzolamide-Brimonidine (SIMBRINZA) 1-0.2 % SUSP Place 1 drop into  both eyes in the morning, at noon, and at bedtime.     Budeson-Glycopyrrol-Formoterol (BREZTRI AEROSPHERE) 160-9-4.8 MCG/ACT AERO Inhale 2 puffs into the lungs in the morning and at bedtime. 10.7 g 0   docusate sodium (COLACE) 100 MG capsule Take 100 mg by mouth 2 (two) times daily as needed for moderate constipation.     escitalopram (LEXAPRO) 10 MG tablet TAKE ONE TABLET BY MOUTH EVERY DAY 28 tablet 5   furosemide (LASIX) 20 MG tablet Take 80 mg by mouth daily.     JARDIANCE 10 MG TABS tablet TAKE ONE TABLET BY MOUTH EVERY DAY BEFORE BREAKFAST. 30 tablet 11   latanoprost (XALATAN) 0.005 % ophthalmic solution Place 1 drop into both eyes at bedtime.     Melatonin 10 MG TABS Take 20 mg by mouth at bedtime.     metoprolol succinate (TOPROL-XL) 25 MG 24 hr tablet Take 25 mg by mouth daily.     montelukast (SINGULAIR) 10 MG tablet TAKE ONE TABLET BY MOUTH AT BEDTIME 28 tablet 5   Multiple Vitamins-Minerals (MULTIVITAMIN WITH MINERALS) tablet Take 1 tablet by mouth daily.     omeprazole (PRILOSEC OTC) 20 MG tablet Take 20 mg by mouth daily.     Polyethyl Glycol-Propyl Glycol (SYSTANE ULTRA) 0.4-0.3 % SOLN Place 1 drop into both eyes daily as needed (dry eyes).     potassium chloride SA (KLOR-CON M) 20 MEQ tablet TAKE TWO TABLETS BY MOUTH IN THE MORNING AND TAKE ONE TABLET IN THE EVENING 90 tablet 6   PRESCRIPTION MEDICATION Pt uses CPAP machine at bedtime     rosuvastatin (CRESTOR) 40 MG tablet TAKE ONE TABLET BY MOUTH EVERY DAY 90 tablet 3   senna-docusate (SENOKOT-S) 8.6-50 MG tablet Take 1 tablet by mouth daily.     spironolactone (ALDACTONE) 25 MG  tablet Take 25 mg by mouth daily.     No current facility-administered medications for this encounter.   Wt Readings from Last 3 Encounters:  01/31/23 83 kg (183 lb)  01/25/23 82.6 kg (182 lb)  12/20/22 81.6 kg (180 lb)   BP (!) 140/60   Pulse 63   Wt 83 kg (183 lb)   SpO2 94%   BMI 24.14 kg/m    General: NAD Neck: No JVD, no thyromegaly or thyroid nodule.  Lungs: Clear to auscultation bilaterally with normal respiratory effort. CV: Nondisplaced PMI.  Heart regular S1/S2, no S3/S4, no murmur.  No peripheral edema.  No carotid bruit.  Normal pedal pulses.  Abdomen: Soft, nontender, no hepatosplenomegaly, no distention.  Skin: Intact without lesions or rashes.  Neurologic: Alert and oriented x 3.  Psych: Normal affect. Extremities: No clubbing or cyanosis.  HEENT: Normal.   Assessment/Plan: 1. Chronic diastolic CHF: Echo in 10/22 with EF 55-60%, mild LVH, mildly dysfunctional RV. Echo in 11/23 with EF 50-55%, RV normal, IVC dilated.  NYHA class III, likely primarily due to deconditioning and COPD.  He is not volume overloaded on exam.  - Continue Jardiance 10 mg daily - Continue Lasix 80 mg daily.  BMET today. 2. Hyperlipidemia: Good lipids in 11/23. - Continue Crestor 40 mg daily.  - Continue Vascepa 2 g bid. 3. Carotid stenosis: s/p R CEA.  Dopplers followed at [facility].  4. PAD: s/p patch angioplasty to left fem-pop bypass in 11/16.  Followed at [facility].  Peripheral arterial dopplers in 3/23 with >50% right iliac stenosis. Peripheral angiography in 3/24 with diffuse disease but nothing critical and no intervention.  No claudication.  - Follows  with [facility].  5. AAA: Stable on last [name], followed at [facility].   6. COPD: No longer smoking. PFTs in 1/18 showed moderately severe obstructive defect. CT chest in 11/18 showed emphysema.  COPD contributes to his dyspnea. 7. Atrial fibrillation: S/p atrial fibrillation ablation in 7/20 and redo in 5/21.  He has been primarily in NSR since redo ablation.   Back in slow atrial fibrillation 9/23 and s/p DCCV 10/23 to NSR. He saw EP recently, poor candidate for redo ablation.  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 GFR would limit Tikosyn to the lowest dose, probably not a good choice for him. He is in NSR today.  - Continue Toprol XL 25 mg daily.  - Continue Eliquis 2.5 mg bid. (Reduced dose with creatinine and age). - Continue to limit ETOH.  - Continue to use CPAP.  8. CKD: Stage 3. Check BMET today  9. CAD: LHC in 11/18 with nonobstructive disease only.  Cardiolite in 6/21 with no ischemia/infarction.  - No chest pain.  10. 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  He is no longer on methimazole, follows with endocrinology.     - Check TSH today.  11. Colon cancer: Sigmoid adenocarcinoma, s/p surgical resection. In remission.  12. HTN: Continue current regimen.  13. Pleural effusion: Chronic right pleural effusion, s/p right lower lobectomy.   14. Squamous cell lung cancer: S/p right lower lobectomy.  Follows with Dr. Tonia Brooms.  15. Upper GI bleeding: In 5/23 due to duodenal ulcer.  - Continue PPI.  - CBC today.   Followup in 3 months.   Marca Ancona  02/01/2023

## 2023-02-04 ENCOUNTER — Ambulatory Visit: Payer: PPO

## 2023-02-05 ENCOUNTER — Ambulatory Visit (HOSPITAL_BASED_OUTPATIENT_CLINIC_OR_DEPARTMENT_OTHER)
Admission: RE | Admit: 2023-02-05 | Discharge: 2023-02-05 | Disposition: A | Discharge status: Home / Self Care | Payer: PPO | Source: Ambulatory Visit | Attending: Pulmonary Disease | Admitting: Pulmonary Disease

## 2023-02-05 DIAGNOSIS — R911 Solitary pulmonary nodule: Secondary | ICD-10-CM | POA: Diagnosis not present

## 2023-02-05 DIAGNOSIS — J90 Pleural effusion, not elsewhere classified: Secondary | ICD-10-CM | POA: Diagnosis not present

## 2023-02-05 DIAGNOSIS — J432 Centrilobular emphysema: Secondary | ICD-10-CM | POA: Diagnosis not present

## 2023-02-07 DIAGNOSIS — E1122 Type 2 diabetes mellitus with diabetic chronic kidney disease: Secondary | ICD-10-CM | POA: Diagnosis not present

## 2023-02-07 DIAGNOSIS — I509 Heart failure, unspecified: Secondary | ICD-10-CM | POA: Diagnosis not present

## 2023-02-07 DIAGNOSIS — J449 Chronic obstructive pulmonary disease, unspecified: Secondary | ICD-10-CM | POA: Diagnosis not present

## 2023-02-07 DIAGNOSIS — Z9181 History of falling: Secondary | ICD-10-CM | POA: Diagnosis not present

## 2023-02-07 DIAGNOSIS — Z85038 Personal history of other malignant neoplasm of large intestine: Secondary | ICD-10-CM | POA: Diagnosis not present

## 2023-02-07 DIAGNOSIS — H409 Unspecified glaucoma: Secondary | ICD-10-CM | POA: Diagnosis not present

## 2023-02-07 DIAGNOSIS — I4891 Unspecified atrial fibrillation: Secondary | ICD-10-CM | POA: Diagnosis not present

## 2023-02-07 DIAGNOSIS — H547 Unspecified visual loss: Secondary | ICD-10-CM | POA: Diagnosis not present

## 2023-02-07 DIAGNOSIS — Z7901 Long term (current) use of anticoagulants: Secondary | ICD-10-CM | POA: Diagnosis not present

## 2023-02-07 DIAGNOSIS — Z7984 Long term (current) use of oral hypoglycemic drugs: Secondary | ICD-10-CM | POA: Diagnosis not present

## 2023-02-07 DIAGNOSIS — N189 Chronic kidney disease, unspecified: Secondary | ICD-10-CM | POA: Diagnosis not present

## 2023-02-12 ENCOUNTER — Ambulatory Visit: Payer: PPO | Attending: Cardiovascular Disease | Admitting: Cardiovascular Disease

## 2023-02-12 NOTE — Telephone Encounter (Signed)
ILR @ [facility]   ILR reached [facility]:  Patient called, discussed options to leave device in or explanted.  Patient would like to:  undecided  Marked "I" in Paceart: done Enter note in Paceart:  done Canceled future remotes: done Discontinued from website: done Entered in Speciality Comments:  done  Advised if further questions arise to please call the device clinic at [phone].

## 2023-02-13 ENCOUNTER — Encounter: Payer: Self-pay | Admitting: Cardiovascular Disease

## 2023-02-18 DIAGNOSIS — H401133 Primary open-angle glaucoma, bilateral, severe stage: Secondary | ICD-10-CM | POA: Diagnosis not present

## 2023-02-21 NOTE — Progress Notes (Signed)
This patient needs and appt to discuss CT results and next steps.   Troy [name] - what do you think about the CT?  Thanks,  BLI  Josephine [name], DO [facility] 02/21/2023 11:46 AM

## 2023-02-23 DIAGNOSIS — G4733 Obstructive sleep apnea (adult) (pediatric): Secondary | ICD-10-CM | POA: Diagnosis not present

## 2023-02-27 DIAGNOSIS — I509 Heart failure, unspecified: Secondary | ICD-10-CM | POA: Diagnosis not present

## 2023-02-27 DIAGNOSIS — R2681 Unsteadiness on feet: Secondary | ICD-10-CM | POA: Diagnosis not present

## 2023-02-27 DIAGNOSIS — J449 Chronic obstructive pulmonary disease, unspecified: Secondary | ICD-10-CM | POA: Diagnosis not present

## 2023-02-27 DIAGNOSIS — R531 Weakness: Secondary | ICD-10-CM | POA: Diagnosis not present

## 2023-02-27 DIAGNOSIS — K219 Gastro-esophageal reflux disease without esophagitis: Secondary | ICD-10-CM | POA: Diagnosis not present

## 2023-03-06 ENCOUNTER — Ambulatory Visit: Payer: PPO | Admitting: Internal Medicine

## 2023-03-07 ENCOUNTER — Encounter: Payer: Self-pay | Admitting: Pulmonary Disease

## 2023-03-07 ENCOUNTER — Ambulatory Visit: Payer: PPO | Admitting: Pulmonary Disease

## 2023-03-07 VITALS — BP 140/60 | HR 66 | Ht 73.0 in | Wt 182.0 lb

## 2023-03-07 DIAGNOSIS — J449 Chronic obstructive pulmonary disease, unspecified: Secondary | ICD-10-CM

## 2023-03-07 DIAGNOSIS — Z85038 Personal history of other malignant neoplasm of large intestine: Secondary | ICD-10-CM

## 2023-03-07 DIAGNOSIS — R918 Other nonspecific abnormal finding of lung field: Secondary | ICD-10-CM | POA: Diagnosis not present

## 2023-03-07 DIAGNOSIS — R942 Abnormal results of pulmonary function studies: Secondary | ICD-10-CM | POA: Diagnosis not present

## 2023-03-07 DIAGNOSIS — R911 Solitary pulmonary nodule: Secondary | ICD-10-CM

## 2023-03-07 DIAGNOSIS — C3491 Malignant neoplasm of unspecified part of right bronchus or lung: Secondary | ICD-10-CM | POA: Diagnosis not present

## 2023-03-07 DIAGNOSIS — Z85118 Personal history of other malignant neoplasm of bronchus and lung: Secondary | ICD-10-CM

## 2023-03-07 NOTE — Progress Notes (Signed)
Synopsis: Referred in June 2022 for abnormal CT chest, PCP: By Pincus Sanes, MD  Subjective:   PATIENT ID: Troy Bruce [name] GENDER: male DOB: Jun 14, 1941, MRN: [id number]  Chief Complaint  Patient presents with   Follow-up    F/up on CT scan    Is an 82 year old gentleman, past medical history of atrial fibrillation, AAA, BPH, chronic diastolic heart failure, emphysema, history of lung cancer, April 2012, squamous cell carcinoma status post right lower lobectomy at [facility], no chemo no radiation no recurrence, hyperlipidemia, hypertension.  Patient has no respiratory complaints today.  He is anxious about this slow-growing lesion that has been followed for some time.  He has several within the right chest, none within the left chest.  Nuclear medicine pet imaging on 02/08/2021 completed revealing a bandlike hypermetabolic nodules within the inferior aspect of the right upper lobe that have slowly increased over time this nodularity area concerning for malignancy as well as a hypermetabolic nodule just inferior to the SVC within the right upper mediastinum concerning for malignancy as well as a small nodule 13 mm in size with low-level metabolic uptake SUV of 1.4 less concerning for malignancy.  However due to his history of lung cancer status post lobectomy and colon cancer they have been following these areas closely and medical oncology.  OV 05/25/2021: Here today for follow-up regarding lung nodules.  Has no respiratory complaints today.  Had CT scan in June.  Here today for follow-up.  Lung nodule appears stable in comparison to his previous imaging.  OV 07/27/2021: Here today for follow-up after recent hospitalization.  Patient brought into the hospital found to have pleural effusion.  Had thoracentesis completed at that time which was negative for malignant cells did have lymphocyte predominance.  From respiratory standpoint he is doing okay.  Had repeat CT imaging at the time also showed  dissipation of the nodules that we were following in the upper lobes.  He was also discharged from the hospital on multiple inhalers and would like clarification which one he wants to be on.  OV 10/13/2021: Here today for follow-up after recent noncontrasted CT imaging of the chest for lung nodules.  Patient has a right-sided lower lobe area that looks stable.  Reviewed CT results today in the office.  Persistent right-sided effusion appears stable.  OV 07/26/2022: Here today for follow-up after recent CT scan of the chest.  Recent CT completed in October 2023 reviewed images today in the office this shows persistence of the right upper lobe band like retraction and rounded nodular density.  This looks stable to me.  There is been slow progression over time.  Unable to exclude malignancy.  Otherwise patient is doing well.  Still has some issues with eyesight has glaucoma currently managed by ophthalmology.  OV 03/07/2023: Here today for follow-up after repeat CT imaging.  It shows progression of the pulmonary nodule on the fissure line in the right upper lobe.  Also shows that there is concern for increased area and scarring which may also be recurrence of malignancy or metastatic disease.  Case discussed with medical oncology.  Would prefer to have repeat biopsy before consideration of treatment.  Has a history of lung cancer status post right lower lobectomy at [facility] in 2012.    Past Medical History:  Diagnosis Date   AAA (abdominal aortic aneurysm) (HCC) 10-20-17   MONITORED BY DR Edilia Bo, 3.7 CM by ABDOMINAL US 11/2021   Anxiety    Arthritis  Atrial fibrillation (HCC)    Basal cell carcinoma    BPH (benign prostatic hypertrophy)    Chronic diastolic heart failure (HCC)    Chronic kidney disease    stage 3, pt unaware   Colon cancer (HCC)    COPD (chronic obstructive pulmonary disease) (HCC)    Depression    GERD (gastroesophageal reflux disease)    Glaucoma BOTH EYES   Dr Nile Riggs    History of lung cancer APRIL 2012   ONCOLOGIST- DR Mariel Sleet  LOV IN [facility] 10-27-2012 SQUAMOUS CELL---- S/P RIGHT LOWER LOBECTOMY AT [facility] -- NO CHEMORADIATION--- NO RECURRENCE   Hx of adenomatous colonic polyps 2005    X 2; 1 hyperplastic polyp; Dr Juanda Chance   Hyperlipidemia    Hypertension    OSA (obstructive sleep apnea) 08/29/2015   CPAP SET ON 10   Peripheral vascular disease (HCC)    FOLLOWED  BY DR DICKSON   Spinal stenosis 06/2014   Thoracic aorta atherosclerosis (HCC)    Thyrotoxicosis    amiodarone induced     Family History  Problem Relation Age of Onset   Stroke Mother        mini strokes   Alcohol abuse Father    Heart disease Father        MI after 10   Stroke Father    Hypertension Father    Heart attack Father    Heart disease Paternal Aunt        several   Hypertension Paternal Aunt        several   Stroke Paternal Aunt        several   Stroke Paternal Uncle        several   Heart disease Paternal Uncle        several;2 had MI pre 68   Cancer Daughter 105       breast ca, also with benign sessile polyp    Colon cancer Daughter 57   Colon polyps Neg Hx    Esophageal cancer Neg Hx    Rectal cancer Neg Hx    Stomach cancer Neg Hx      Past Surgical History:  Procedure Laterality Date   ABDOMINAL AORTOGRAM W/LOWER EXTREMITY N/A 12/20/2022   Procedure: ABDOMINAL AORTOGRAM W/LOWER EXTREMITY;  Surgeon: Cephus Shelling, MD;  [facility];  Service: Cardiovascular;  Laterality: N/A;   ANGIO PLASTY     X 4 in legs   AORTOGRAM  07-27-2002   MILD DIFFUSE ILIAC ARTERY OCCLUSIVE DISEASE /  LEFT RENAL ARTERY 20%/ PATENT LEFT FEM-POP GRAFT/ MILD SFA AND POPLITEAL ARTERY OCCLUSIVE DISEASE W/ SEVERE KIDNEY OCCLUSIVE DISEASE   ATRIAL FIBRILLATION ABLATION N/A 04/09/2019   Procedure: ATRIAL FIBRILLATION ABLATION;  Surgeon: Hillis Range, MD;  [facility];  Service: Cardiovascular;  Laterality: N/A;   ATRIAL FIBRILLATION ABLATION N/A  02/12/2020   Procedure: ATRIAL FIBRILLATION ABLATION;  Surgeon: Hillis Range, MD;  [facility];  Service: Cardiovascular;  Laterality: N/A;   BALLOON DILATION N/A 02/06/2022   Procedure: BALLOON Tamponade ampulla;  Surgeon: Lemar Lofty., MD;  Location: [facility] ENDOSCOPY;  Service: Gastroenterology;  Laterality: N/A;   BASAL CELL CARCINOMA EXCISION     MULTIPLE TIMES--  RIGHT FOREARM, CHEEKS, AND BACK   BIOPSY  06/25/2018   Procedure: BIOPSY;  Surgeon: Meridee Score Netty Starring., MD;  Location: [facility] ENDOSCOPY;  Service: Gastroenterology;;   BIOPSY  02/06/2022   Procedure: BIOPSY;  Surgeon: Lemar Lofty., MD;  Location:  [facility] ENDOSCOPY;  Service: Gastroenterology;;   CARDIOVASCULAR STRESS TEST  12-08-2012  DR [facility]   NORMAL LEXISCAN WITH NO EXERCISE NUCLEAR STUDY/ EF 66%/   NO ISCHEMIA/ NO SIGNIFICANT CHANGE FROM PRIOR STUDY   CARDIOVERSION N/A 02/14/2018   Procedure: CARDIOVERSION;  Surgeon: Laurey Morale, MD;  Location: [facility] ENDOSCOPY;  Service: Cardiovascular;  Laterality: N/A;   CARDIOVERSION N/A 07/10/2022   Procedure: CARDIOVERSION;  Surgeon: Laurey Morale, MD;  Location: [facility] ENDOSCOPY;  Service: Cardiovascular;  Laterality: N/A;   CAROTID ANGIOGRAM N/A 07/10/2013   Procedure: CAROTID ANGIOGRAM;  Surgeon: Sherren Kerns, MD;  Location: [facility] CATH LAB;  Service: Cardiovascular;  Laterality: N/A;   CAROTID ENDARTERECTOMY Right 07-14-13   cea   CATARACT EXTRACTION W/ INTRAOCULAR LENS  IMPLANT, BILATERAL     colon polyectomy     COLONOSCOPY     COLONOSCOPY WITH PROPOFOL N/A 06/25/2018   Procedure: COLONOSCOPY WITH PROPOFOL;  Surgeon: Meridee Score Netty Starring., MD;  Location: [facility] ENDOSCOPY;  Service: Gastroenterology;  Laterality: N/A;   CYSTOSCOPY W/ RETROGRADES Bilateral 01/21/2013   Procedure: CYSTOSCOPY WITH RETROGRADE PYELOGRAM;  Surgeon: Milford Cage, MD;  Location: [facility];  Service: Urology;  Laterality: Bilateral;   CYSTO, BLADDER  BIOPSY, BILATERAL RETROGRADE PYELOGRAM  RAD TECH FROM RADIOLOGY PER JOY   CYSTOSCOPY WITH BIOPSY N/A 01/21/2013   Procedure: CYSTOSCOPY WITH BIOPSY;  Surgeon: Milford Cage, MD;  Location: [facility];  Service: Urology;  Laterality: N/A;   ENDARTERECTOMY Right 07/14/2013   Procedure: ENDARTERECTOMY CAROTID;  Surgeon: Chuck Hint, MD;  Location: [facility] OR;  Service: Vascular;  Laterality: Right;   ENDOSCOPIC RETROGRADE CHOLANGIOPANCREATOGRAPHY (ERCP) WITH PROPOFOL N/A 02/06/2022   Procedure: ENDOSCOPIC RETROGRADE CHOLANGIOPANCREATOGRAPHY (ERCP) WITH PROPOFOL;  Surgeon: Lemar Lofty., MD;  Location: [facility] ENDOSCOPY;  Service: Gastroenterology;  Laterality: N/A;   EP IMPLANTABLE DEVICE N/A 08/12/2015   Procedure: Loop Recorder Insertion;  Surgeon: Hillis Range, MD;  [facility];  Service: Cardiovascular;  Laterality: N/A;   ESOPHAGOGASTRODUODENOSCOPY Left 02/13/2022   Procedure: ESOPHAGOGASTRODUODENOSCOPY (EGD);  Surgeon: Shellia Cleverly, DO;  Location: [facility] ENDOSCOPY;  Service: Gastroenterology;  Laterality: Left;   ESOPHAGOGASTRODUODENOSCOPY (EGD) WITH PROPOFOL N/A 06/25/2018   Procedure: ESOPHAGOGASTRODUODENOSCOPY (EGD) WITH PROPOFOL;  Surgeon: Meridee Score Netty Starring., MD;  Location: [name] ENDOSCOPY;  Service: Gastroenterology;  Laterality: N/A;   ESOPHAGOGASTRODUODENOSCOPY (EGD) WITH PROPOFOL N/A 07/10/2018   Procedure: ESOPHAGOGASTRODUODENOSCOPY (EGD) WITH PROPOFOL;  Surgeon: Rachael Fee, MD;  Location: [facility] ENDOSCOPY;  Service: Endoscopy;  Laterality: N/A;   EUS N/A 07/10/2018   Procedure: UPPER ENDOSCOPIC ULTRASOUND (EUS) RADIAL;  Surgeon: Rachael Fee, MD;  Location: [facility] ENDOSCOPY;  Service: Endoscopy;  Laterality: N/A;   EYE SURGERY Right    FEMORAL-POPLITEAL BYPASS GRAFT Left 1994  [facility]   AND 2001  IN [place]   FEMORAL-POPLITEAL BYPASS GRAFT Left 08/30/2015   Procedure: REVISION OF BYPASS GRAFT Left  FEMORAL-POPLITEAL ARTERY;   Surgeon: Chuck Hint, MD;  Location: [facility] OR;  Service: Vascular;  Laterality: Left;   FINE NEEDLE ASPIRATION N/A 07/10/2018   Procedure: FINE NEEDLE ASPIRATION (FNA) LINEAR;  Surgeon: Rachael Fee, MD;  Location: [facility] ENDOSCOPY;  Service: Endoscopy;  Laterality: N/A;   FLEXIBLE SIGMOIDOSCOPY N/A 12/12/2018   Procedure: FLEXIBLE SIGMOIDOSCOPY;  Surgeon: Andria Meuse, MD;  Location: [facility] ORS;  Service: General;  Laterality: N/A;   HEMOSTASIS CLIP PLACEMENT  02/13/2022   Procedure: HEMOSTASIS CLIP PLACEMENT;  Surgeon: Shellia Cleverly, DO;  Location[facility]  ENDOSCOPY;  Service: Gastroenterology;;   HOT HEMOSTASIS N/A 02/13/2022   Procedure: HOT HEMOSTASIS (ARGON PLASMA COAGULATION/BICAP);  Surgeon: Shellia Cleverly, DO;  Location: [facility] ENDOSCOPY;  Service: Gastroenterology;  Laterality: N/A;   LARYNGOSCOPY  06-27-2004   BX VOCAL CORD  (LEUKOPLAKIA)  PER PT NO ISSUES SINCE   LEFT HEART CATH AND CORONARY ANGIOGRAPHY N/A 08/20/2017   Procedure: LEFT HEART CATH AND CORONARY ANGIOGRAPHY;  Surgeon: Laurey Morale, MD;  Location: [facility] INVASIVE CV LAB;  Service: Cardiovascular;  Laterality: N/A;   LOOP RECORDER INSERTION N/A 04/09/2019   Procedure: LOOP RECORDER INSERTION;  Surgeon: Hillis Range, MD;  [facility];  Service: Cardiovascular;  Laterality: N/A;   LOOP RECORDER REMOVAL N/A 04/09/2019   Procedure: LOOP RECORDER REMOVAL;  Surgeon: Hillis Range, MD;  [facility];  Service: Cardiovascular;  Laterality: N/A;   LOWER EXTREMITY ANGIOGRAM Bilateral 08/29/2015   Procedure: Lower Extremity Angiogram;  Surgeon: Chuck Hint, MD;  Location: [facility] INVASIVE CV LAB;  Service: Cardiovascular;  Laterality: Bilateral;   LUNG LOBECTOMY  01/24/2011    RIGHT UPPER LOBE  (SQUAMOUS CELL CARCINOMA) Dr Desmond Lope , [facility]. No chemotherapyor radiation   PANCREATIC STENT PLACEMENT  02/06/2022   Procedure: PANCREATIC STENT PLACEMENT;  Surgeon: Meridee Score Netty Starring., MD;   Location: [facility] ENDOSCOPY;  Service: Gastroenterology;;   [facility] ANGIOPLASTY Right 07/14/2013   Procedure: PATCH ANGIOPLASTY;  Surgeon: Chuck Hint, MD;  Location: [facility] OR;  Service: Vascular;  Laterality: Right;   PATCH ANGIOPLASTY Left 08/30/2015   Procedure: VEIN PATCH ANGIOPLASTY OF PROXIMAL Left BYPASS GRAFT;  Surgeon: Chuck Hint, MD;  Location: [facility] OR;  Service: Vascular;  Laterality: Left;   PERIPHERAL VASCULAR CATHETERIZATION N/A 08/29/2015   Procedure: Abdominal Aortogram;  Surgeon: Chuck Hint, MD;  Location: [facility] INVASIVE CV LAB;  Service: Cardiovascular;  Laterality: N/A;   POLYPECTOMY  06/25/2018   Procedure: POLYPECTOMY;  Surgeon: Meridee Score Netty Starring., MD;  Location: [facility] ENDOSCOPY;  Service: Gastroenterology;;   POLYPECTOMY     POLYPECTOMY  02/13/2022   Procedure: POLYPECTOMY;  Surgeon: Shellia Cleverly, DO;  Location[facility] ENDOSCOPY;  Service: Gastroenterology;;   REMOVAL OF STONES  02/06/2022   Procedure: REMOVAL OF STONES;  Surgeon: Lemar Lofty., MD;  Location: [facility] ENDOSCOPY;  Service: Gastroenterology;;   Susa Day  02/13/2022   Procedure: Susa Day;  Surgeon: Shellia Cleverly, DO;  Location: [facility] ENDOSCOPY;  Service: Gastroenterology;;   Dennison Mascot  02/06/2022   Procedure: Dennison Mascot;  Surgeon: Lemar Lofty., MD;  Location: [facility] ENDOSCOPY;  Service: Gastroenterology;;   SUBMUCOSAL INJECTION  06/25/2018   Procedure: SUBMUCOSAL INJECTION;  Surgeon: Lemar Lofty., MD;  Location: [facility] ENDOSCOPY;  Service: Gastroenterology;;   TEE WITHOUT CARDIOVERSION N/A 02/14/2018   Procedure: TRANSESOPHAGEAL ECHOCARDIOGRAM (TEE);  Surgeon: Laurey Morale, MD;  Location: [facility] ENDOSCOPY;  Service: Cardiovascular;  Laterality: N/A;   trabecular surgery     OS   TRANSTHORACIC ECHOCARDIOGRAM  12-29-2012  DR [facility]   MILD LVH/  LVSF NORMAL/ EF 55-60%/  GRADE I DIASTOLIC DYSFUNCTION    Social History   Socioeconomic History   Marital  status: Married    Spouse name: Westly Pam    Number of children: 3   Years of education: 12+   Highest education level: Not on file  Occupational History   Occupation: Retired    Comment: Owns a [employer], Sales executive, 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  Tobacco Use  Smoking status: Former    Packs/day: 0.50    Years: 50.00    Additional pack years: 0.00    Total pack years: 25.00    Types: Cigarettes    Quit date: 07/28/2014    Years since quitting: 8.6    Passive exposure: Never   Smokeless tobacco: Never  Vaping Use   Vaping Use: Never used  Substance and Sexual Activity   Alcohol use: Yes    Alcohol/week: 4.0 standard drinks of alcohol    Types: 1 Glasses of wine, 1 Cans of beer, 1 Shots of liquor, 1 Standard drinks or equivalent per week    Comment: socially, variable; moderately heavy use in the past   Drug use: No   Sexual activity: Not Currently  Other Topics Concern   Not on file  Social History Narrative   Patient lives at home a lone has caregivers   Patient has 3 children    Patient is right handed    Social Determinants of Health   Financial Resource Strain: Low Risk  (10/05/2022)   Overall Financial Resource Strain (CARDIA)    Difficulty of Paying Living Expenses: Not hard at all  Food Insecurity: No Food Insecurity (10/05/2022)   Hunger Vital Sign    Worried About Running Out of Food in the Last Year: Never true    Ran Out of Food in the Last Year: Never true  Transportation Needs: No Transportation Needs (10/05/2022)   PRAPARE - Administrator, Civil Service (Medical): No    Lack of Transportation (Non-Medical): No  Physical Activity: Inactive (10/05/2022)   Exercise Vital Sign    Days of Exercise per Week: 0 days    Minutes of Exercise per Session: 0 min  Stress: No Stress Concern Present (10/05/2022)   [employer] of Occupational Health - Occupational Stress Questionnaire    Feeling  of Stress : Not at all  Social Connections: Socially Isolated (10/05/2022)   Social Connection and Isolation Panel [NHANES]    Frequency of Communication with Friends and Family: More than three times a week    Frequency of Social Gatherings with Friends and Family: Once a week    Attends Religious Services: Never    Database administrator or Organizations: No    Attends Banker Meetings: Never    Marital Status: Widowed  Intimate Partner Violence: Not At Risk (10/05/2022)   Humiliation, Afraid, Rape, and Kick questionnaire    Fear of Current or Ex-Partner: No    Emotionally Abused: No    Physically Abused: No    Sexually Abused: No     Allergies  Allergen Reactions   Niacin-Lovastatin Er Shortness Of Breath and Other (See Comments)    Dyspnea Flushing  *Not listed on MAR    Penicillins Hives, Shortness Of Breath and Other (See Comments)    Dyspnea Flushing Because of a history of documented adverse serious drug reaction;Medi Alert bracelet  is recommended PCN reaction causing immediate rash, facial/tongue/throat swelling, SOB or lightheadedness with hypotension: Yes PCN reaction causing severe rash involving mucus membranes or skin necrosis: No PCN reaction occurring within the last 10 years: NO PCN reaction that required hospitalization: NO   Sulfonamide Derivatives Hives, Shortness Of Breath and Other (See Comments)    Dyspnea  Flushing Because of a history of documented adverse serious drug reaction;Medi Alert bracelet  is recommended   Lipitor [Atorvastatin] Other (See Comments)    Myalgias Arthralgias   *Not listed on [facility]  Outpatient Medications Prior to Visit  Medication Sig Dispense Refill   acetaminophen (TYLENOL) 325 MG tablet Take 650 mg by mouth every 6 (six) hours as needed for moderate pain or headache.     albuterol (VENTOLIN HFA) 108 (90 Base) MCG/ACT inhaler Inhale 2 puffs into the lungs every 6 (six) hours as needed for wheezing or  shortness of breath.     amLODipine (NORVASC) 10 MG tablet Take 10 mg by mouth daily.     apixaban (ELIQUIS) 2.5 MG TABS tablet Take 2.5 mg by mouth 2 (two) times daily.     Brinzolamide-Brimonidine (SIMBRINZA) 1-0.2 % SUSP Place 1 drop into both eyes in the morning, at noon, and at bedtime.     Budeson-Glycopyrrol-Formoterol (BREZTRI AEROSPHERE) 160-9-4.8 MCG/ACT AERO Inhale 2 puffs into the lungs in the morning and at bedtime. 10.7 g 0   docusate sodium (COLACE) 100 MG capsule Take 100 mg by mouth 2 (two) times daily as needed for moderate constipation.     escitalopram (LEXAPRO) 10 MG tablet TAKE ONE TABLET BY MOUTH EVERY DAY 28 tablet 5   furosemide (LASIX) 20 MG tablet Take 80 mg by mouth daily.     JARDIANCE 10 MG TABS tablet TAKE ONE TABLET BY MOUTH EVERY DAY BEFORE BREAKFAST. 30 tablet 11   latanoprost (XALATAN) 0.005 % ophthalmic solution Place 1 drop into both eyes at bedtime.     Melatonin 10 MG TABS Take 20 mg by mouth at bedtime.     metoprolol succinate (TOPROL-XL) 25 MG 24 hr tablet Take 25 mg by mouth daily.     montelukast (SINGULAIR) 10 MG tablet TAKE ONE TABLET BY MOUTH AT BEDTIME 28 tablet 5   Multiple Vitamins-Minerals (MULTIVITAMIN WITH MINERALS) tablet Take 1 tablet by mouth daily.     omeprazole (PRILOSEC OTC) 20 MG tablet Take 20 mg by mouth daily.     Polyethyl Glycol-Propyl Glycol (SYSTANE ULTRA) 0.4-0.3 % SOLN Place 1 drop into both eyes daily as needed (dry eyes).     potassium chloride SA (KLOR-CON M) 20 MEQ tablet TAKE TWO TABLETS BY MOUTH IN THE MORNING AND TAKE ONE TABLET IN THE EVENING 90 tablet 6   PRESCRIPTION MEDICATION Pt uses CPAP machine at bedtime     rosuvastatin (CRESTOR) 40 MG tablet TAKE ONE TABLET BY MOUTH EVERY DAY 90 tablet 3   senna-docusate (SENOKOT-S) 8.6-50 MG tablet Take 1 tablet by mouth daily.     spironolactone (ALDACTONE) 25 MG tablet Take 25 mg by mouth daily.     No facility-administered medications prior to visit.    Review of  Systems  Constitutional:  Positive for weight loss. Negative for chills, fever and malaise/fatigue.  HENT:  Negative for hearing loss, sore throat and tinnitus.   Eyes:  Negative for blurred vision and double vision.  Respiratory:  Positive for shortness of breath. Negative for cough, hemoptysis, sputum production, wheezing and stridor.   Cardiovascular:  Negative for chest pain, palpitations, orthopnea, leg swelling and PND.  Gastrointestinal:  Negative for abdominal pain, constipation, diarrhea, heartburn, nausea and vomiting.  Genitourinary:  Negative for dysuria, hematuria and urgency.  Musculoskeletal:  Negative for joint pain and myalgias.  Skin:  Negative for itching and rash.  Neurological:  Negative for dizziness, tingling, weakness and headaches.  Endo/Heme/Allergies:  Negative for environmental allergies. Does not bruise/bleed easily.  Psychiatric/Behavioral:  Negative for depression. The patient is not nervous/anxious and does not have insomnia.   All other systems reviewed and are negative.  Objective:  Physical Exam Vitals reviewed.  Constitutional:      General: He is not in acute distress.    Appearance: He is well-developed.  HENT:     Head: Normocephalic and atraumatic.  Eyes:     General: No scleral icterus.    Conjunctiva/sclera: Conjunctivae normal.     Pupils: Pupils are equal, round, and reactive to light.  Neck:     Vascular: No JVD.     Trachea: No tracheal deviation.  Cardiovascular:     Rate and Rhythm: Normal rate and regular rhythm.     Heart sounds: Normal heart sounds. No murmur heard. Pulmonary:     Effort: Pulmonary effort is normal. No tachypnea, accessory muscle usage or respiratory distress.     Breath sounds: No stridor. No wheezing, rhonchi or rales.  Abdominal:     General: There is no distension.     Palpations: Abdomen is soft.     Tenderness: There is no abdominal tenderness.  Musculoskeletal:        General: No tenderness.      Cervical back: Neck supple.     Comments: Cool lower extremities, chronic vascular changes.  Lymphadenopathy:     Cervical: No cervical adenopathy.  Skin:    General: Skin is warm and dry.     Capillary Refill: Capillary refill takes less than 2 seconds.     Findings: No rash.  Neurological:     Mental Status: He is alert and oriented to person, place, and time.  Psychiatric:        Behavior: Behavior normal.      Vitals:   03/07/23 1424  BP: (!) 140/60  Pulse: 66  SpO2: 94%  Weight: 182 lb (82.6 kg)  Height: 6\' 1"  (1.854 m)   94% on RA BMI Readings from Last 3 Encounters:  03/07/23 24.01 kg/m  01/31/23 24.14 kg/m  01/25/23 24.01 kg/m   Wt Readings from Last 3 Encounters:  03/07/23 182 lb (82.6 kg)  01/31/23 183 lb (83 kg)  01/25/23 182 lb (82.6 kg)     CBC    Component Value Date/Time   WBC 5.8 01/31/2023 1448   RBC 4.85 01/31/2023 1448   HGB 14.5 01/31/2023 1448   HGB 14.3 02/09/2021 1136   HGB 12.4 (L) 05/16/2020 1615   HGB 11.8 (L) 03/07/2016 1213   HCT 42.9 01/31/2023 1448   HCT 37.9 05/16/2020 1615   HCT 36.3 (L) 03/07/2016 1213   PLT 329 01/31/2023 1448   PLT 248 02/09/2021 1136   PLT 329 05/16/2020 1615   MCV 88.5 01/31/2023 1448   MCV 94 05/16/2020 1615   MCV 85.0 03/07/2016 1213   MCH 29.9 01/31/2023 1448   MCHC 33.8 01/31/2023 1448   RDW 14.4 01/31/2023 1448   RDW 14.2 05/16/2020 1615   RDW 14.7 (H) 03/07/2016 1213   LYMPHSABS 0.3 (L) 08/26/2022 0215   LYMPHSABS 0.9 05/16/2020 1615   LYMPHSABS 0.8 (L) 03/07/2016 1213   MONOABS 0.3 08/26/2022 0215   MONOABS 0.8 03/07/2016 1213   EOSABS 0.0 08/26/2022 0215   EOSABS 0.1 05/16/2020 1615   BASOSABS 0.0 08/26/2022 0215   BASOSABS 0.0 05/16/2020 1615   BASOSABS 0.0 03/07/2016 1213    Chest Imaging: 02/08/2021: Nuclear medicine pet imaging Hypermetabolic bandlike nodules within the right upper lobe along the fissure.  A smaller 13 mm nodule anterior right upper lobe.  Also has a right  superior mediastinal node just adjacent to the SVC. All have been followed for  some time on CT imaging concern for potential malignancy. The patient's images have been independently reviewed by me.    03/29/2021: CT scan of the chest: Stable right upper lobe pulmonary nodule. The patient's images have been independently reviewed by me.   CT chest 06/30/2021: Loculated effusion, stable scattered nodular changes Slightly enlarged left paratracheal lymph node. The patient's images have been independently reviewed by me.    CT chest May 2024: Right upper lobe pulmonary nodule increased in size as well as along the fissure line concerning for recurrence of malignancy and or 2 lesions. The patient's images have been independently reviewed by me.    Pulmonary Functions Testing Results:    Latest Ref Rng & Units 08/13/2017    3:00 PM 10/04/2016   11:03 AM  PFT Results  FVC-Pre L 3.10  3.14   FVC-Predicted Pre % 67  67   FVC-Post L  3.26   FVC-Predicted Post %  70   Pre FEV1/FVC % % 64  62   Post FEV1/FCV % %  64   FEV1-Pre L 1.98  1.96   FEV1-Predicted Pre % 59  58   FEV1-Post L  2.10   DLCO uncorrected ml/min/mmHg 12.40  14.56   DLCO UNC% % 34  40   DLCO corrected ml/min/mmHg  14.44   DLCO COR %Predicted %  40   DLVA Predicted % 56  59   TLC L 7.39  5.77   TLC % Predicted % 97  76   RV % Predicted % 156  92     FeNO:   Pathology:   Echocardiogram:   Heart Catheterization:     Assessment & Plan:     ICD-10-CM   1. Lung nodule  R91.1 Procedural/ Surgical Case Request: ROBOTIC ASSISTED NAVIGATIONAL BRONCHOSCOPY    Ambulatory referral to Pulmonology    NM PET Image Initial (PI) Skull Base To Thigh (F-18 FDG)    2. History of lung cancer  Z85.118     3. COPD mixed type (HCC)  J44.9     4. Personal history of colon cancer  Z85.038     5. Squamous cell carcinoma of right lung (HCC)  C34.91     6. Abnormal CT scan, lung  R91.8     7. Abnormal PET scan, lung  R94.2         Discussion:  This is a 82 year old male, history of lung cancer status post right lower lobectomy at [facility] in 2012 also has a history of colon cancer.  Follows with Dr. Truett Perna from medical oncology who.  We reviewed his CT imaging which shows progression of lesions in the right upper lobe concerning for malignancy.  Plan: After review of the images and discussion with Dr. Truett Perna I think we can move forward with bronchoscopy and biopsy. We talked about the risk of bleeding and pneumothorax. He is on Eliquis. We will need to hold his Eliquis prior to to the procedure, last dose Saturday prior to the procedure.  I would prefer to have PET scan prior to the procedure to help better localization and targeting.  Patient will need 1 week follow-up post bronchoscopy. We appreciate PCC's help with scheduling.   Current Outpatient Medications:    acetaminophen (TYLENOL) 325 MG tablet, Take 650 mg by mouth every 6 (six) hours as needed for moderate pain or headache., Disp: , Rfl:    albuterol (VENTOLIN HFA) 108 (90 Base) MCG/ACT inhaler, Inhale 2 puffs into the lungs every 6 (six)  hours as needed for wheezing or shortness of breath., Disp: , Rfl:    amLODipine (NORVASC) 10 MG tablet, Take 10 mg by mouth daily., Disp: , Rfl:    apixaban (ELIQUIS) 2.5 MG TABS tablet, Take 2.5 mg by mouth 2 (two) times daily., Disp: , Rfl:    Brinzolamide-Brimonidine (SIMBRINZA) 1-0.2 % SUSP, Place 1 drop into both eyes in the morning, at noon, and at bedtime., Disp: , Rfl:    Budeson-Glycopyrrol-Formoterol (BREZTRI AEROSPHERE) 160-9-4.8 MCG/ACT AERO, Inhale 2 puffs into the lungs in the morning and at bedtime., Disp: 10.7 g, Rfl: 0   docusate sodium (COLACE) 100 MG capsule, Take 100 mg by mouth 2 (two) times daily as needed for moderate constipation., Disp: , Rfl:    escitalopram (LEXAPRO) 10 MG tablet, TAKE ONE TABLET BY MOUTH EVERY DAY, Disp: 28 tablet, Rfl: 5   furosemide (LASIX) 20 MG tablet, Take 80 mg by  mouth daily., Disp: , Rfl:    JARDIANCE 10 MG TABS tablet, TAKE ONE TABLET BY MOUTH EVERY DAY BEFORE BREAKFAST., Disp: 30 tablet, Rfl: 11   latanoprost (XALATAN) 0.005 % ophthalmic solution, Place 1 drop into both eyes at bedtime., Disp: , Rfl:    Melatonin 10 MG TABS, Take 20 mg by mouth at bedtime., Disp: , Rfl:    metoprolol succinate (TOPROL-XL) 25 MG 24 hr tablet, Take 25 mg by mouth daily., Disp: , Rfl:    montelukast (SINGULAIR) 10 MG tablet, TAKE ONE TABLET BY MOUTH AT BEDTIME, Disp: 28 tablet, Rfl: 5   Multiple Vitamins-Minerals (MULTIVITAMIN WITH MINERALS) tablet, Take 1 tablet by mouth daily., Disp: , Rfl:    omeprazole (PRILOSEC OTC) 20 MG tablet, Take 20 mg by mouth daily., Disp: , Rfl:    Polyethyl Glycol-Propyl Glycol (SYSTANE ULTRA) 0.4-0.3 % SOLN, Place 1 drop into both eyes daily as needed (dry eyes)., Disp: , Rfl:    potassium chloride SA (KLOR-CON M) 20 MEQ tablet, TAKE TWO TABLETS BY MOUTH IN THE MORNING AND TAKE ONE TABLET IN THE EVENING, Disp: 90 tablet, Rfl: 6   PRESCRIPTION MEDICATION, Pt uses CPAP machine at bedtime, Disp: , Rfl:    rosuvastatin (CRESTOR) 40 MG tablet, TAKE ONE TABLET BY MOUTH EVERY DAY, Disp: 90 tablet, Rfl: 3   senna-docusate (SENOKOT-S) 8.6-50 MG tablet, Take 1 tablet by mouth daily., Disp: , Rfl:    spironolactone (ALDACTONE) 25 MG tablet, Take 25 mg by mouth daily., Disp: , Rfl:   I spent 42 minutes dedicated to the care of this patient on the date of this encounter to include pre-visit review of records, face-to-face time with the patient discussing conditions above, post visit ordering of testing, clinical documentation with the electronic health record, making appropriate referrals as documented, and communicating necessary findings to members of the patients care team.    Josephine [name], DO [facility] 03/07/2023 2:52 PM

## 2023-03-07 NOTE — H&P (View-Only) (Signed)
 Synopsis: Referred in June 2022 for abnormal CT chest, PCP: By Burns, Stacy J, MD  Subjective:   PATIENT ID: Troy Douglas Scherr [name] GENDER: male DOB: 07/09/1941, MRN: [id number]  Chief Complaint  Patient presents with   Follow-up    F/up on CT scan    Is an 82-year-old gentleman, past medical history of atrial fibrillation, AAA, BPH, chronic diastolic heart failure, emphysema, history of lung cancer, April 2012, squamous cell carcinoma status post right lower lobectomy at [facility], no chemo no radiation no recurrence, hyperlipidemia, hypertension.  Patient has no respiratory complaints today.  He is anxious about this slow-growing lesion that has been followed for some time.  He has several within the right chest, none within the left chest.  Nuclear medicine pet imaging on 02/08/2021 completed revealing a bandlike hypermetabolic nodules within the inferior aspect of the right upper lobe that have slowly increased over time this nodularity area concerning for malignancy as well as a hypermetabolic nodule just inferior to the SVC within the right upper mediastinum concerning for malignancy as well as a small nodule 13 mm in size with low-level metabolic uptake SUV of 1.4 less concerning for malignancy.  However due to his history of lung cancer status post lobectomy and colon cancer they have been following these areas closely and medical oncology.  OV 05/25/2021: Here today for follow-up regarding lung nodules.  Has no respiratory complaints today.  Had CT scan in June.  Here today for follow-up.  Lung nodule appears stable in comparison to his previous imaging.  OV 07/27/2021: Here today for follow-up after recent hospitalization.  Patient brought into the hospital found to have pleural effusion.  Had thoracentesis completed at that time which was negative for malignant cells did have lymphocyte predominance.  From respiratory standpoint he is doing okay.  Had repeat CT imaging at the time also showed  dissipation of the nodules that we were following in the upper lobes.  He was also discharged from the hospital on multiple inhalers and would like clarification which one he wants to be on.  OV 10/13/2021: Here today for follow-up after recent noncontrasted CT imaging of the chest for lung nodules.  Patient has a right-sided lower lobe area that looks stable.  Reviewed CT results today in the office.  Persistent right-sided effusion appears stable.  OV 07/26/2022: Here today for follow-up after recent CT scan of the chest.  Recent CT completed in October 2023 reviewed images today in the office this shows persistence of the right upper lobe band like retraction and rounded nodular density.  This looks stable to me.  There is been slow progression over time.  Unable to exclude malignancy.  Otherwise patient is doing well.  Still has some issues with eyesight has glaucoma currently managed by ophthalmology.  OV 03/07/2023: Here today for follow-up after repeat CT imaging.  It shows progression of the pulmonary nodule on the fissure line in the right upper lobe.  Also shows that there is concern for increased area and scarring which may also be recurrence of malignancy or metastatic disease.  Case discussed with medical oncology.  Would prefer to have repeat biopsy before consideration of treatment.  Has a history of lung cancer status post right lower lobectomy at [facility] in 2012.    Past Medical History:  Diagnosis Date   AAA (abdominal aortic aneurysm) (HCC) 10-20-17   MONITORED BY DR DICKSON, 3.7 CM by ABDOMINAL US 11/2021   Anxiety    Arthritis      Atrial fibrillation (HCC)    Basal cell carcinoma    BPH (benign prostatic hypertrophy)    Chronic diastolic heart failure (HCC)    Chronic kidney disease    stage 3, pt unaware   Colon cancer (HCC)    COPD (chronic obstructive pulmonary disease) (HCC)    Depression    GERD (gastroesophageal reflux disease)    Glaucoma BOTH EYES   Dr Shapiro    History of lung cancer APRIL 2012   ONCOLOGIST- DR NEIJSTROM  LOV IN EPIC 10-27-2012 SQUAMOUS CELL---- S/P RIGHT LOWER LOBECTOMY AT [facility] -- NO CHEMORADIATION--- NO RECURRENCE   Hx of adenomatous colonic polyps 2005    X 2; 1 hyperplastic polyp; Dr Brodie   Hyperlipidemia    Hypertension    OSA (obstructive sleep apnea) 08/29/2015   CPAP SET ON 10   Peripheral vascular disease (HCC)    FOLLOWED  BY DR DICKSON   Spinal stenosis 06/2014   Thoracic aorta atherosclerosis (HCC)    Thyrotoxicosis    amiodarone induced     Family History  Problem Relation Age of Onset   Stroke Mother        mini strokes   Alcohol abuse Father    Heart disease Father        MI after 55   Stroke Father    Hypertension Father    Heart attack Father    Heart disease Paternal Aunt        several   Hypertension Paternal Aunt        several   Stroke Paternal Aunt        several   Stroke Paternal Uncle        several   Heart disease Paternal Uncle        several;2 had MI pre 55   Cancer Daughter 47       breast ca, also with benign sessile polyp    Colon cancer Daughter 51   Colon polyps Neg Hx    Esophageal cancer Neg Hx    Rectal cancer Neg Hx    Stomach cancer Neg Hx      Past Surgical History:  Procedure Laterality Date   ABDOMINAL AORTOGRAM W/LOWER EXTREMITY N/A 12/20/2022   Procedure: ABDOMINAL AORTOGRAM W/LOWER EXTREMITY;  Surgeon: Clark, Christopher J, MD;  [facility];  Service: Cardiovascular;  Laterality: N/A;   ANGIO PLASTY     X 4 in legs   AORTOGRAM  07-27-2002   MILD DIFFUSE ILIAC ARTERY OCCLUSIVE DISEASE /  LEFT RENAL ARTERY 20%/ PATENT LEFT FEM-POP GRAFT/ MILD SFA AND POPLITEAL ARTERY OCCLUSIVE DISEASE W/ SEVERE KIDNEY OCCLUSIVE DISEASE   ATRIAL FIBRILLATION ABLATION N/A 04/09/2019   Procedure: ATRIAL FIBRILLATION ABLATION;  Surgeon: Allred, James, MD;  [facility];  Service: Cardiovascular;  Laterality: N/A;   ATRIAL FIBRILLATION ABLATION N/A  02/12/2020   Procedure: ATRIAL FIBRILLATION ABLATION;  Surgeon: Allred, James, MD;  [facility];  Service: Cardiovascular;  Laterality: N/A;   BALLOON DILATION N/A 02/06/2022   Procedure: BALLOON Tamponade ampulla;  Surgeon: Mansouraty, Gabriel Jr., MD;  [facility]: [facility];  Service: Gastroenterology;  Laterality: N/A;   BASAL CELL CARCINOMA EXCISION     MULTIPLE TIMES--  RIGHT FOREARM, CHEEKS, AND BACK   BIOPSY  06/25/2018   Procedure: BIOPSY;  Surgeon: Mansouraty, Gabriel Jr., MD;  [facility];  Service: Gastroenterology;;   BIOPSY  02/06/2022   Procedure: BIOPSY;  Surgeon: Mansouraty, Gabriel Jr., MD;  Location:   [facility];  Service: Gastroenterology;;   CARDIOVASCULAR STRESS TEST  12-08-2012  DR MCLEAN   NORMAL LEXISCAN WITH NO EXERCISE NUCLEAR STUDY/ EF 66%/   NO ISCHEMIA/ NO SIGNIFICANT CHANGE FROM PRIOR STUDY   CARDIOVERSION N/A 02/14/2018   Procedure: CARDIOVERSION;  Surgeon: McLean, Dalton S, MD;  [facility];  Service: Cardiovascular;  Laterality: N/A;   CARDIOVERSION N/A 07/10/2022   Procedure: CARDIOVERSION;  Surgeon: McLean, Dalton S, MD;  [facility];  Service: Cardiovascular;  Laterality: N/A;   CAROTID ANGIOGRAM N/A 07/10/2013   Procedure: CAROTID ANGIOGRAM;  Surgeon: Charles E Fields, MD;  [facility];  Service: Cardiovascular;  Laterality: N/A;   CAROTID ENDARTERECTOMY Right 07-14-13   cea   CATARACT EXTRACTION W/ INTRAOCULAR LENS  IMPLANT, BILATERAL     colon polyectomy     COLONOSCOPY     COLONOSCOPY WITH PROPOFOL N/A 06/25/2018   Procedure: COLONOSCOPY WITH PROPOFOL;  Surgeon: Mansouraty, Gabriel Jr., MD;  [facility];  Service: Gastroenterology;  Laterality: N/A;   CYSTOSCOPY W/ RETROGRADES Bilateral 01/21/2013   Procedure: CYSTOSCOPY WITH RETROGRADE PYELOGRAM;  Surgeon: Daniel Young Woodruff, MD;  Location: [facility];  Service: Urology;  Laterality: Bilateral;   CYSTO, BLADDER  BIOPSY, BILATERAL RETROGRADE PYELOGRAM  RAD TECH FROM RADIOLOGY PER JOY   CYSTOSCOPY WITH BIOPSY N/A 01/21/2013   Procedure: CYSTOSCOPY WITH BIOPSY;  Surgeon: Daniel Young Woodruff, MD;  Location: [facility];  Service: Urology;  Laterality: N/A;   ENDARTERECTOMY Right 07/14/2013   Procedure: ENDARTERECTOMY CAROTID;  Surgeon: Christopher S Dickson, MD;  [facility];  Service: Vascular;  Laterality: Right;   ENDOSCOPIC RETROGRADE CHOLANGIOPANCREATOGRAPHY (ERCP) WITH PROPOFOL N/A 02/06/2022   Procedure: ENDOSCOPIC RETROGRADE CHOLANGIOPANCREATOGRAPHY (ERCP) WITH PROPOFOL;  Surgeon: Mansouraty, Gabriel Jr., MD;  [facility];  Service: Gastroenterology;  Laterality: N/A;   EP IMPLANTABLE DEVICE N/A 08/12/2015   Procedure: Loop Recorder Insertion;  Surgeon: James Allred, MD;  [facility];  Service: Cardiovascular;  Laterality: N/A;   ESOPHAGOGASTRODUODENOSCOPY Left 02/13/2022   Procedure: ESOPHAGOGASTRODUODENOSCOPY (EGD);  Surgeon: Cirigliano, Vito V, DO;  [facility];  Service: Gastroenterology;  Laterality: Left;   ESOPHAGOGASTRODUODENOSCOPY (EGD) WITH PROPOFOL N/A 06/25/2018   Procedure: ESOPHAGOGASTRODUODENOSCOPY (EGD) WITH PROPOFOL;  Surgeon: Mansouraty, Gabriel Jr., MD;  [facility];  Service: Gastroenterology;  Laterality: N/A;   ESOPHAGOGASTRODUODENOSCOPY (EGD) WITH PROPOFOL N/A 07/10/2018   Procedure: ESOPHAGOGASTRODUODENOSCOPY (EGD) WITH PROPOFOL;  Surgeon: Jacobs, Daniel P, MD;  [facility];  Service: Endoscopy;  Laterality: N/A;   EUS N/A 07/10/2018   Procedure: UPPER ENDOSCOPIC ULTRASOUND (EUS) RADIAL;  Surgeon: Jacobs, Daniel P, MD;  [facility];  Service: Endoscopy;  Laterality: N/A;   EYE SURGERY Right    FEMORAL-POPLITEAL BYPASS GRAFT Left 1994  [facility]   AND 2001  IN [place]   FEMORAL-POPLITEAL BYPASS GRAFT Left 08/30/2015   Procedure: REVISION OF BYPASS GRAFT Left  FEMORAL-POPLITEAL ARTERY;   Surgeon: Christopher S Dickson, MD;  [facility];  Service: Vascular;  Laterality: Left;   FINE NEEDLE ASPIRATION N/A 07/10/2018   Procedure: FINE NEEDLE ASPIRATION (FNA) LINEAR;  Surgeon: Jacobs, Daniel P, MD;  [facility];  Service: Endoscopy;  Laterality: N/A;   FLEXIBLE SIGMOIDOSCOPY N/A 12/12/2018   Procedure: FLEXIBLE SIGMOIDOSCOPY;  Surgeon: White, Christopher M, MD;  [facility];  Service: General;  Laterality: N/A;   HEMOSTASIS CLIP PLACEMENT  02/13/2022   Procedure: HEMOSTASIS CLIP PLACEMENT;  Surgeon: Cirigliano, Vito V, DO;  [facility];  Service: Gastroenterology;;   HOT HEMOSTASIS N/A 02/13/2022   Procedure: HOT HEMOSTASIS (ARGON PLASMA COAGULATION/BICAP);  Surgeon: Cirigliano, Vito V, DO;  [facility];  Service: Gastroenterology;  Laterality: N/A;   LARYNGOSCOPY  06-27-2004   BX VOCAL CORD  (LEUKOPLAKIA)  PER PT NO ISSUES SINCE   LEFT HEART CATH AND CORONARY ANGIOGRAPHY N/A 08/20/2017   Procedure: LEFT HEART CATH AND CORONARY ANGIOGRAPHY;  Surgeon: McLean, Dalton S, MD;  [facility];  Service: Cardiovascular;  Laterality: N/A;   LOOP RECORDER INSERTION N/A 04/09/2019   Procedure: LOOP RECORDER INSERTION;  Surgeon: Allred, James, MD;  [facility];  Service: Cardiovascular;  Laterality: N/A;   LOOP RECORDER REMOVAL N/A 04/09/2019   Procedure: LOOP RECORDER REMOVAL;  Surgeon: Allred, James, MD;  [facility];  Service: Cardiovascular;  Laterality: N/A;   LOWER EXTREMITY ANGIOGRAM Bilateral 08/29/2015   Procedure: Lower Extremity Angiogram;  Surgeon: Christopher S Dickson, MD;  [facility];  Service: Cardiovascular;  Laterality: Bilateral;   LUNG LOBECTOMY  01/24/2011    RIGHT UPPER LOBE  (SQUAMOUS CELL CARCINOMA) Dr D'imico , [facility]. No chemotherapyor radiation   PANCREATIC STENT PLACEMENT  02/06/2022   Procedure: PANCREATIC STENT PLACEMENT;  Surgeon: Mansouraty, Gabriel Jr., MD;   [facility];  Service: Gastroenterology;;   PATCH ANGIOPLASTY Right 07/14/2013   Procedure: PATCH ANGIOPLASTY;  Surgeon: Christopher S Dickson, MD;  [facility];  Service: Vascular;  Laterality: Right;   PATCH ANGIOPLASTY Left 08/30/2015   Procedure: VEIN PATCH ANGIOPLASTY OF PROXIMAL Left BYPASS GRAFT;  Surgeon: Christopher S Dickson, MD;  [facility];  Service: Vascular;  Laterality: Left;   PERIPHERAL VASCULAR CATHETERIZATION N/A 08/29/2015   Procedure: Abdominal Aortogram;  Surgeon: Christopher S Dickson, MD;  [facility];  Service: Cardiovascular;  Laterality: N/A;   POLYPECTOMY  06/25/2018   Procedure: POLYPECTOMY;  Surgeon: Mansouraty, Gabriel Jr., MD;  [facility];  Service: Gastroenterology;;   POLYPECTOMY     POLYPECTOMY  02/13/2022   Procedure: POLYPECTOMY;  Surgeon: Cirigliano, Vito V, DO;  [facility];  Service: Gastroenterology;;   REMOVAL OF STONES  02/06/2022   Procedure: REMOVAL OF STONES;  Surgeon: Mansouraty, Gabriel Jr., MD;  [facility];  Service: Gastroenterology;;   SCLEROTHERAPY  02/13/2022   Procedure: SCLEROTHERAPY;  Surgeon: Cirigliano, Vito V, DO;  [facility];  Service: Gastroenterology;;   SPHINCTEROTOMY  02/06/2022   Procedure: SPHINCTEROTOMY;  Surgeon: Mansouraty, Gabriel Jr., MD;  [facility];  Service: Gastroenterology;;   SUBMUCOSAL INJECTION  06/25/2018   Procedure: SUBMUCOSAL INJECTION;  Surgeon: Mansouraty, Gabriel Jr., MD;  [facility];  Service: Gastroenterology;;   TEE WITHOUT CARDIOVERSION N/A 02/14/2018   Procedure: TRANSESOPHAGEAL ECHOCARDIOGRAM (TEE);  Surgeon: McLean, Dalton S, MD;  [facility];  Service: Cardiovascular;  Laterality: N/A;   trabecular surgery     OS   TRANSTHORACIC ECHOCARDIOGRAM  12-29-2012  DR MCLEAN   MILD LVH/  LVSF NORMAL/ EF 55-60%/  GRADE I DIASTOLIC DYSFUNCTION    Social History   Socioeconomic History   Marital  status: Married    Spouse name: Lacy    Number of children: 3   Years of education: 12+   Highest education level: Not on file  Occupational History   Occupation: Retired    Comment: Owns a local electrical company, [employer], 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  Tobacco Use     Smoking status: Former    Packs/day: 0.50    Years: 50.00    Additional pack years: 0.00    Total pack years: 25.00    Types: Cigarettes    Quit date: 07/28/2014    Years since quitting: 8.6    Passive exposure: Never   Smokeless tobacco: Never  Vaping Use   Vaping Use: Never used  Substance and Sexual Activity   Alcohol use: Yes    Alcohol/week: 4.0 standard drinks of alcohol    Types: 1 Glasses of wine, 1 Cans of beer, 1 Shots of liquor, 1 Standard drinks or equivalent per week    Comment: socially, variable; moderately heavy use in the past   Drug use: No   Sexual activity: Not Currently  Other Topics Concern   Not on file  Social History Narrative   Patient lives at home a lone has caregivers   Patient has 3 children    Patient is right handed    Social Determinants of Health   Financial Resource Strain: Low Risk  (10/05/2022)   Overall Financial Resource Strain (CARDIA)    Difficulty of Paying Living Expenses: Not hard at all  Food Insecurity: No Food Insecurity (10/05/2022)   Hunger Vital Sign    Worried About Running Out of Food in the Last Year: Never true    Ran Out of Food in the Last Year: Never true  Transportation Needs: No Transportation Needs (10/05/2022)   PRAPARE - Transportation    Lack of Transportation (Medical): No    Lack of Transportation (Non-Medical): No  Physical Activity: Inactive (10/05/2022)   Exercise Vital Sign    Days of Exercise per Week: 0 days    Minutes of Exercise per Session: 0 min  Stress: No Stress Concern Present (10/05/2022)   [employer] - Occupational Stress Questionnaire    Feeling  of Stress : Not at all  Social Connections: Socially Isolated (10/05/2022)   Social Connection and Isolation Panel [NHANES]    Frequency of Communication with Friends and Family: More than three times a week    Frequency of Social Gatherings with Friends and Family: Once a week    Attends Religious Services: Never    Active Member of Clubs or Organizations: No    Attends Club or Organization Meetings: Never    Marital Status: Widowed  Intimate Partner Violence: Not At Risk (10/05/2022)   Humiliation, Afraid, Rape, and Kick questionnaire    Fear of Current or Ex-Partner: No    Emotionally Abused: No    Physically Abused: No    Sexually Abused: No     Allergies  Allergen Reactions   Niacin-Lovastatin Er Shortness Of Breath and Other (See Comments)    Dyspnea Flushing  *Not listed on MAR    Penicillins Hives, Shortness Of Breath and Other (See Comments)    Dyspnea Flushing Because of a history of documented adverse serious drug reaction;Medi Alert bracelet  is recommended PCN reaction causing immediate rash, facial/tongue/throat swelling, SOB or lightheadedness with hypotension: Yes PCN reaction causing severe rash involving mucus membranes or skin necrosis: No PCN reaction occurring within the last 10 years: NO PCN reaction that required hospitalization: NO   Sulfonamide Derivatives Hives, Shortness Of Breath and Other (See Comments)    Dyspnea  Flushing Because of a history of documented adverse serious drug reaction;Medi Alert bracelet  is recommended   Lipitor [Atorvastatin] Other (See Comments)    Myalgias Arthralgias   *Not listed on MAR        Outpatient Medications Prior to Visit  Medication Sig Dispense Refill   acetaminophen (TYLENOL) 325 MG tablet Take 650 mg by mouth every 6 (six) hours as needed for moderate pain or headache.     albuterol (VENTOLIN HFA) 108 (90 Base) MCG/ACT inhaler Inhale 2 puffs into the lungs every 6 (six) hours as needed for wheezing or  shortness of breath.     amLODipine (NORVASC) 10 MG tablet Take 10 mg by mouth daily.     apixaban (ELIQUIS) 2.5 MG TABS tablet Take 2.5 mg by mouth 2 (two) times daily.     Brinzolamide-Brimonidine (SIMBRINZA) 1-0.2 % SUSP Place 1 drop into both eyes in the morning, at noon, and at bedtime.     Budeson-Glycopyrrol-Formoterol (BREZTRI AEROSPHERE) 160-9-4.8 MCG/ACT AERO Inhale 2 puffs into the lungs in the morning and at bedtime. 10.7 g 0   docusate sodium (COLACE) 100 MG capsule Take 100 mg by mouth 2 (two) times daily as needed for moderate constipation.     escitalopram (LEXAPRO) 10 MG tablet TAKE ONE TABLET BY MOUTH EVERY DAY 28 tablet 5   furosemide (LASIX) 20 MG tablet Take 80 mg by mouth daily.     JARDIANCE 10 MG TABS tablet TAKE ONE TABLET BY MOUTH EVERY DAY BEFORE BREAKFAST. 30 tablet 11   latanoprost (XALATAN) 0.005 % ophthalmic solution Place 1 drop into both eyes at bedtime.     Melatonin 10 MG TABS Take 20 mg by mouth at bedtime.     metoprolol succinate (TOPROL-XL) 25 MG 24 hr tablet Take 25 mg by mouth daily.     montelukast (SINGULAIR) 10 MG tablet TAKE ONE TABLET BY MOUTH AT BEDTIME 28 tablet 5   Multiple Vitamins-Minerals (MULTIVITAMIN WITH MINERALS) tablet Take 1 tablet by mouth daily.     omeprazole (PRILOSEC OTC) 20 MG tablet Take 20 mg by mouth daily.     Polyethyl Glycol-Propyl Glycol (SYSTANE ULTRA) 0.4-0.3 % SOLN Place 1 drop into both eyes daily as needed (dry eyes).     potassium chloride SA (KLOR-CON M) 20 MEQ tablet TAKE TWO TABLETS BY MOUTH IN THE MORNING AND TAKE ONE TABLET IN THE EVENING 90 tablet 6   PRESCRIPTION MEDICATION Pt uses CPAP machine at bedtime     rosuvastatin (CRESTOR) 40 MG tablet TAKE ONE TABLET BY MOUTH EVERY DAY 90 tablet 3   senna-docusate (SENOKOT-S) 8.6-50 MG tablet Take 1 tablet by mouth daily.     spironolactone (ALDACTONE) 25 MG tablet Take 25 mg by mouth daily.     No facility-administered medications prior to visit.    Review of  Systems  Constitutional:  Positive for weight loss. Negative for chills, fever and malaise/fatigue.  HENT:  Negative for hearing loss, sore throat and tinnitus.   Eyes:  Negative for blurred vision and double vision.  Respiratory:  Positive for shortness of breath. Negative for cough, hemoptysis, sputum production, wheezing and stridor.   Cardiovascular:  Negative for chest pain, palpitations, orthopnea, leg swelling and PND.  Gastrointestinal:  Negative for abdominal pain, constipation, diarrhea, heartburn, nausea and vomiting.  Genitourinary:  Negative for dysuria, hematuria and urgency.  Musculoskeletal:  Negative for joint pain and myalgias.  Skin:  Negative for itching and rash.  Neurological:  Negative for dizziness, tingling, weakness and headaches.  Endo/Heme/Allergies:  Negative for environmental allergies. Does not bruise/bleed easily.  Psychiatric/Behavioral:  Negative for depression. The patient is not nervous/anxious and does not have insomnia.   All other systems reviewed and are negative.      Objective:  Physical Exam Vitals reviewed.  Constitutional:      General: He is not in acute distress.    Appearance: He is well-developed.  HENT:     Head: Normocephalic and atraumatic.  Eyes:     General: No scleral icterus.    Conjunctiva/sclera: Conjunctivae normal.     Pupils: Pupils are equal, round, and reactive to light.  Neck:     Vascular: No JVD.     Trachea: No tracheal deviation.  Cardiovascular:     Rate and Rhythm: Normal rate and regular rhythm.     Heart sounds: Normal heart sounds. No murmur heard. Pulmonary:     Effort: Pulmonary effort is normal. No tachypnea, accessory muscle usage or respiratory distress.     Breath sounds: No stridor. No wheezing, rhonchi or rales.  Abdominal:     General: There is no distension.     Palpations: Abdomen is soft.     Tenderness: There is no abdominal tenderness.  Musculoskeletal:        General: No tenderness.      Cervical back: Neck supple.     Comments: Cool lower extremities, chronic vascular changes.  Lymphadenopathy:     Cervical: No cervical adenopathy.  Skin:    General: Skin is warm and dry.     Capillary Refill: Capillary refill takes less than 2 seconds.     Findings: No rash.  Neurological:     Mental Status: He is alert and oriented to person, place, and time.  Psychiatric:        Behavior: Behavior normal.      Vitals:   03/07/23 1424  BP: (!) 140/60  Pulse: 66  SpO2: 94%  Weight: 182 lb (82.6 kg)  Height: 6' 1" (1.854 m)   94% on RA BMI Readings from Last 3 Encounters:  03/07/23 24.01 kg/m  01/31/23 24.14 kg/m  01/25/23 24.01 kg/m   Wt Readings from Last 3 Encounters:  03/07/23 182 lb (82.6 kg)  01/31/23 183 lb (83 kg)  01/25/23 182 lb (82.6 kg)     CBC    Component Value Date/Time   WBC 5.8 01/31/2023 1448   RBC 4.85 01/31/2023 1448   HGB 14.5 01/31/2023 1448   HGB 14.3 02/09/2021 1136   HGB 12.4 (L) 05/16/2020 1615   HGB 11.8 (L) 03/07/2016 1213   HCT 42.9 01/31/2023 1448   HCT 37.9 05/16/2020 1615   HCT 36.3 (L) 03/07/2016 1213   PLT 329 01/31/2023 1448   PLT 248 02/09/2021 1136   PLT 329 05/16/2020 1615   MCV 88.5 01/31/2023 1448   MCV 94 05/16/2020 1615   MCV 85.0 03/07/2016 1213   MCH 29.9 01/31/2023 1448   MCHC 33.8 01/31/2023 1448   RDW 14.4 01/31/2023 1448   RDW 14.2 05/16/2020 1615   RDW 14.7 (H) 03/07/2016 1213   LYMPHSABS 0.3 (L) 08/26/2022 0215   LYMPHSABS 0.9 05/16/2020 1615   LYMPHSABS 0.8 (L) 03/07/2016 1213   MONOABS 0.3 08/26/2022 0215   MONOABS 0.8 03/07/2016 1213   EOSABS 0.0 08/26/2022 0215   EOSABS 0.1 05/16/2020 1615   BASOSABS 0.0 08/26/2022 0215   BASOSABS 0.0 05/16/2020 1615   BASOSABS 0.0 03/07/2016 1213    Chest Imaging: 02/08/2021: Nuclear medicine pet imaging Hypermetabolic bandlike nodules within the right upper lobe along the fissure.  A smaller 13 mm nodule anterior right upper lobe.  Also has a right  superior mediastinal node just adjacent to the SVC. All have been followed for   some time on CT imaging concern for potential malignancy. The patient's images have been independently reviewed by me.    03/29/2021: CT scan of the chest: Stable right upper lobe pulmonary nodule. The patient's images have been independently reviewed by me.   CT chest 06/30/2021: Loculated effusion, stable scattered nodular changes Slightly enlarged left paratracheal lymph node. The patient's images have been independently reviewed by me.    CT chest May 2024: Right upper lobe pulmonary nodule increased in size as well as along the fissure line concerning for recurrence of malignancy and or 2 lesions. The patient's images have been independently reviewed by me.    Pulmonary Functions Testing Results:    Latest Ref Rng & Units 08/13/2017    3:00 PM 10/04/2016   11:03 AM  PFT Results  FVC-Pre L 3.10  3.14   FVC-Predicted Pre % 67  67   FVC-Post L  3.26   FVC-Predicted Post %  70   Pre FEV1/FVC % % 64  62   Post FEV1/FCV % %  64   FEV1-Pre L 1.98  1.96   FEV1-Predicted Pre % 59  58   FEV1-Post L  2.10   DLCO uncorrected ml/min/mmHg 12.40  14.56   DLCO UNC% % 34  40   DLCO corrected ml/min/mmHg  14.44   DLCO COR %Predicted %  40   DLVA Predicted % 56  59   TLC L 7.39  5.77   TLC % Predicted % 97  76   RV % Predicted % 156  92     FeNO:   Pathology:   Echocardiogram:   Heart Catheterization:     Assessment & Plan:     ICD-10-CM   1. Lung nodule  R91.1 Procedural/ Surgical Case Request: ROBOTIC ASSISTED NAVIGATIONAL BRONCHOSCOPY    Ambulatory referral to Pulmonology    NM PET Image Initial (PI) Skull Base To Thigh (F-18 FDG)    2. History of lung cancer  Z85.118     3. COPD mixed type (HCC)  J44.9     4. Personal history of colon cancer  Z85.038     5. Squamous cell carcinoma of right lung (HCC)  C34.91     6. Abnormal CT scan, lung  R91.8     7. Abnormal PET scan, lung  R94.2         Discussion:  This is a 81-year-old male, history of lung cancer status post right lower lobectomy at [facility] in 2012 also has a history of colon cancer.  Follows with Dr. Sherrill from medical oncology who.  We reviewed his CT imaging which shows progression of lesions in the right upper lobe concerning for malignancy.  Plan: After review of the images and discussion with Dr. Sherrill I think we can move forward with bronchoscopy and biopsy. We talked about the risk of bleeding and pneumothorax. He is on Eliquis. We will need to hold his Eliquis prior to to the procedure, last dose Saturday prior to the procedure.  I would prefer to have PET scan prior to the procedure to help better localization and targeting.  Patient will need 1 week follow-up post bronchoscopy. We appreciate PCC's help with scheduling.   Current Outpatient Medications:    acetaminophen (TYLENOL) 325 MG tablet, Take 650 mg by mouth every 6 (six) hours as needed for moderate pain or headache., Disp: , Rfl:    albuterol (VENTOLIN HFA) 108 (90 Base) MCG/ACT inhaler, Inhale 2 puffs into the lungs every 6 (six)   hours as needed for wheezing or shortness of breath., Disp: , Rfl:    amLODipine (NORVASC) 10 MG tablet, Take 10 mg by mouth daily., Disp: , Rfl:    apixaban (ELIQUIS) 2.5 MG TABS tablet, Take 2.5 mg by mouth 2 (two) times daily., Disp: , Rfl:    Brinzolamide-Brimonidine (SIMBRINZA) 1-0.2 % SUSP, Place 1 drop into both eyes in the morning, at noon, and at bedtime., Disp: , Rfl:    Budeson-Glycopyrrol-Formoterol (BREZTRI AEROSPHERE) 160-9-4.8 MCG/ACT AERO, Inhale 2 puffs into the lungs in the morning and at bedtime., Disp: 10.7 g, Rfl: 0   docusate sodium (COLACE) 100 MG capsule, Take 100 mg by mouth 2 (two) times daily as needed for moderate constipation., Disp: , Rfl:    escitalopram (LEXAPRO) 10 MG tablet, TAKE ONE TABLET BY MOUTH EVERY DAY, Disp: 28 tablet, Rfl: 5   furosemide (LASIX) 20 MG tablet, Take 80 mg by  mouth daily., Disp: , Rfl:    JARDIANCE 10 MG TABS tablet, TAKE ONE TABLET BY MOUTH EVERY DAY BEFORE BREAKFAST., Disp: 30 tablet, Rfl: 11   latanoprost (XALATAN) 0.005 % ophthalmic solution, Place 1 drop into both eyes at bedtime., Disp: , Rfl:    Melatonin 10 MG TABS, Take 20 mg by mouth at bedtime., Disp: , Rfl:    metoprolol succinate (TOPROL-XL) 25 MG 24 hr tablet, Take 25 mg by mouth daily., Disp: , Rfl:    montelukast (SINGULAIR) 10 MG tablet, TAKE ONE TABLET BY MOUTH AT BEDTIME, Disp: 28 tablet, Rfl: 5   Multiple Vitamins-Minerals (MULTIVITAMIN WITH MINERALS) tablet, Take 1 tablet by mouth daily., Disp: , Rfl:    omeprazole (PRILOSEC OTC) 20 MG tablet, Take 20 mg by mouth daily., Disp: , Rfl:    Polyethyl Glycol-Propyl Glycol (SYSTANE ULTRA) 0.4-0.3 % SOLN, Place 1 drop into both eyes daily as needed (dry eyes)., Disp: , Rfl:    potassium chloride SA (KLOR-CON M) 20 MEQ tablet, TAKE TWO TABLETS BY MOUTH IN THE MORNING AND TAKE ONE TABLET IN THE EVENING, Disp: 90 tablet, Rfl: 6   PRESCRIPTION MEDICATION, Pt uses CPAP machine at bedtime, Disp: , Rfl:    rosuvastatin (CRESTOR) 40 MG tablet, TAKE ONE TABLET BY MOUTH EVERY DAY, Disp: 90 tablet, Rfl: 3   senna-docusate (SENOKOT-S) 8.6-50 MG tablet, Take 1 tablet by mouth daily., Disp: , Rfl:    spironolactone (ALDACTONE) 25 MG tablet, Take 25 mg by mouth daily., Disp: , Rfl:   I spent 42 minutes dedicated to the care of this patient on the date of this encounter to include pre-visit review of records, face-to-face time with the patient discussing conditions above, post visit ordering of testing, clinical documentation with the electronic health record, making appropriate referrals as documented, and communicating necessary findings to members of the patients care team.    Marcee Jacobs L Yaneli Keithley, DO [facility] 03/07/2023 2:52 PM   

## 2023-03-07 NOTE — Patient Instructions (Signed)
Thank you for visiting Dr. Tonia Brooms at [facility]. Today we recommend the following:  Orders Placed This Encounter  Procedures   Procedural/ Surgical Case Request: ROBOTIC ASSISTED NAVIGATIONAL BRONCHOSCOPY   NM PET Image Initial (PI) Skull Base To Thigh (F-18 FDG)   Ambulatory referral to Pulmonology   Bronchoscopy 03/26/2023  Return in about 26 days (around 04/02/2023) for w/ Kandice Robinsons, NP , after Bronchoscopy.    Please do your part to reduce the spread of COVID-19.

## 2023-03-11 ENCOUNTER — Ambulatory Visit: Payer: PPO

## 2023-03-22 ENCOUNTER — Encounter (HOSPITAL_COMMUNITY): Payer: PPO

## 2023-03-25 ENCOUNTER — Encounter (HOSPITAL_COMMUNITY): Payer: Self-pay | Admitting: Emergency Medicine

## 2023-03-25 ENCOUNTER — Encounter
Admission: RE | Admit: 2023-03-25 | Discharge: 2023-03-25 | Disposition: A | Discharge status: Home / Self Care | Payer: PPO | Source: Ambulatory Visit | Attending: Pulmonary Disease | Admitting: Pulmonary Disease

## 2023-03-25 DIAGNOSIS — R911 Solitary pulmonary nodule: Secondary | ICD-10-CM | POA: Insufficient documentation

## 2023-03-25 DIAGNOSIS — C3490 Malignant neoplasm of unspecified part of unspecified bronchus or lung: Secondary | ICD-10-CM | POA: Diagnosis not present

## 2023-03-25 LAB — GLUCOSE, CAPILLARY: Glucose-Capillary: 113 mg/dL — ABNORMAL HIGH (ref 70–99)

## 2023-03-25 MED ORDER — FLUDEOXYGLUCOSE F - 18 (FDG) INJECTION
9.4000 | Freq: Once | INTRAVENOUS | Status: AC
  Administered 2023-03-25: 9.92 via INTRAVENOUS

## 2023-03-25 NOTE — Anesthesia Preprocedure Evaluation (Signed)
Anesthesia Evaluation  Patient identified by MRN, date of birth, ID band Patient awake    Reviewed: Allergy & Precautions, H&P , NPO status , Patient's Chart, lab work & pertinent test results  Airway Mallampati: II  TM Distance: >3 FB Neck ROM: Full    Dental no notable dental hx.    Pulmonary sleep apnea , COPD, former smoker   Pulmonary exam normal breath sounds clear to auscultation       Cardiovascular hypertension, Pt. on medications + CAD, + Peripheral Vascular Disease, +CHF and + DOE  negative cardio ROS Normal cardiovascular exam Rhythm:Regular Rate:Normal     Neuro/Psych   Anxiety Depression    TIA negative psych ROS   GI/Hepatic negative GI ROS, Neg liver ROS,GERD  ,,  Endo/Other  negative endocrine ROSdiabetes    Renal/GU Renal InsufficiencyRenal diseasenegative Renal ROS  negative genitourinary   Musculoskeletal  (+) Arthritis , Osteoarthritis,    Abdominal   Peds negative pediatric ROS (+)  Hematology negative hematology ROS (+)   Anesthesia Other Findings   Reproductive/Obstetrics negative OB ROS                             Anesthesia Physical Anesthesia Plan  ASA: 3  Anesthesia Plan: General   Post-op Pain Management: Minimal or no pain anticipated   Induction: Intravenous  PONV Risk Score and Plan: 2 and Ondansetron, Midazolam and Treatment may vary due to age or medical condition  Airway Management Planned: Oral ETT  Additional Equipment:   Intra-op Plan:   Post-operative Plan: Extubation in OR  Informed Consent: I have reviewed the patients History and Physical, chart, labs and discussed the procedure including the risks, benefits and alternatives for the proposed anesthesia with the patient or authorized representative who has indicated his/her understanding and acceptance.     Dental advisory given  Plan Discussed with: CRNA  Anesthesia Plan  Comments: (See PAT note from 6/24 by K Jefm Bryant PA-C )        Anesthesia Quick Evaluation

## 2023-03-25 NOTE — Progress Notes (Signed)
SDW call  Patient was given pre-op instructions over the phone. Patient verbalized understanding of instructions provided. Patient lives in a memory care facility, [facility] [place], [phone].  Spoke with Augusto Garbe, they are aware patient has a bronch scheduled tomorrow, 03/26/2023 and that his son will pick him up.  I faxed a copy of the pre-surgical instructions to them for reference to his medications.  (Fax [phone] and [phone]).    PCP - Dr. Cheryll Cockayne Cardiologist - Dr. Shirlee Latch Pulmonary:    PPM/ICD - Patient has a loop recorder, does not have a remote Device Orders -  Rep Notified -    Chest x-ray - 12/07/2022 EKG -  01/31/2023 Stress Test -03/29/2020 ECHO - 08/24/2022 Cardiac Cath -   Sleep Study/sleep apnea/CPAP: Diagnosed with sleep apnea and wears a CPAP every night  Non-diabetic Patient is not sure why he is on Jardiance, instructed him to hold today and tomorrow's dose.    Blood Thinner Instructions: Eliquis, states last dose 03/23/2023 Aspirin Instructions:denies   ERAS Protcol - No, NPO PRE-SURGERY Ensure or G2-    COVID TEST- n/a    Anesthesia review: Yes HTN, PVD, sleep apnea, CPAP, CKD, AAA   Patient denies shortness of breath, fever, cough and chest pain over the phone call   Your procedure is scheduled on Tuesday March 26, 2023  Report to [facility] Main Entrance "A" at  0645  A.M., then check in with the Admitting office.  Call this number if you have problems the morning of surgery:  [phone]   If you have any questions prior to your surgery date call [phone]: Open Monday-Friday 8am-4pm If you experience any cold or flu symptoms such as cough, fever, chills, shortness of breath, etc. between now and your scheduled surgery, please notify us at the above number    Remember:  Do not eat or drink after midnight the night before your surgery  Take these medicines the morning of surgery with A SIP OF WATER:  Amlodipine, simbrinza eye  drops, breztri aerosphere, lexapro, metoprolol, omeprazole, crestor  As needed: Tylenol, albuterol, systane eye drops.   As of today, STOP taking any Aspirin (unless otherwise instructed by your surgeon) Aleve, Naproxen, Ibuprofen, Motrin, Advil, Goody's, BC's, all herbal medications, fish oil, and all vitamins.

## 2023-03-25 NOTE — Progress Notes (Signed)
Surgical Instructions   Your procedure is scheduled on Tuesday March 26, 2023.  Report to [facility] Main Entrance "A" at 6:45 A.M., then check in with the Admitting office.  Call this number if you have problems the morning of surgery:  [phone]   If you have any questions prior to your surgery date call [phone]: Open Monday-Friday 8am-4pm If you experience any cold or flu symptoms such as cough, fever, chills, shortness of breath, etc. between now and your scheduled surgery, please notify us at the above number    Remember:  Do not eat or drink after midnight the night before your surgery   Take these medicines the morning of surgery with A SIP OF WATER:  amLODipine (NORVASC)  Brinzolamide-Brimonidine (SIMBRINZA)  Budeson-Glycopyrrol-Formoterol (BREZTRI AEROSPHERE)  escitalopram (LEXAPRO)  metoprolol succinate (TOPROL-XL)  omeprazole (PRILOSEC OTC)  rosuvastatin (CRESTOR)    As needed acetaminophen (TYLENOL)  albuterol (VENTOLIN HFA)  Polyethyl Glycol-Propyl Glycol (SYSTANE ULTRA) eye drops   You may be on additional medications, however, if it is not listed above, do NOT take it the morning of surgery.    Your last day of apixaban Everlene Balls) should have been 03/23/2023.  Do NOT take JARDIANCE three days prior to surgery or the day of surgery.    As of today, STOP taking any Aspirin (unless otherwise instructed by your surgeon) Aleve, Naproxen, Ibuprofen, Motrin, Advil, Goody's, BC's, all herbal medications, fish oil, and all vitamins.           Do NOT Smoke (Tobacco/Vaping)  24 hours prior to your procedure  If you use a CPAP at night, you may bring your mask for your overnight stay.   Contacts, glasses, hearing aids, dentures or partials may not be worn into surgery, please bring cases for these belongings   For patients admitted to the hospital, discharge time will be determined by your treatment team.   Patients discharged the day of surgery will not be  allowed to drive home, and someone needs to stay with them for 24 hours.   SURGICAL WAITING ROOM VISITATION Patients having surgery or a procedure may have no more than 2 support people in the waiting area - these visitors may rotate.   Children under the age of 47 must have an adult with them who is not the patient. If the patient needs to stay at the hospital during part of their recovery, the visitor guidelines for inpatient rooms apply. Pre-op nurse will coordinate an appropriate time for 1 support person to accompany patient in pre-op.  This support person may not rotate.   Please refer to [email or web address] for the visitor guidelines for Inpatients (after your surgery is over and you are in a regular room).    Special instructions:    Oral Hygiene is also important to reduce your risk of infection.  Remember - BRUSH YOUR TEETH THE MORNING OF SURGERY WITH YOUR REGULAR TOOTHPASTE   [facility]- Preparing For Surgery  Please follow these instructions carefully.     Shower the [employer] [facility] and the with Dial Soap.   Pat yourself dry with a CLEAN TOWEL.  Wear CLEAN PAJAMAS to bed the night before surgery  Place CLEAN SHEETS on your bed the night before your surgery  Day of Surgery:  Take a shower with Dial soap. Do not wear jewelry or makeup. Do not wear lotions, powders, cologne or deodorant. Men may shave face and neck. Do not bring valuables to the hospital.  [facility] is not  responsible for any belongings or valuables.   Wear Clean/Comfortable clothing the morning of surgery  Remember to brush your teeth WITH YOUR REGULAR TOOTHPASTE.   If you received a COVID test during your pre-op visit, it is requested that you wear a mask when out in public, stay away from anyone that may not be feeling well, and notify your surgeon if you develop symptoms. If you have been in contact with anyone that has tested positive  in the last 10 days, please notify your surgeon.    Please read over the following fact sheets that you were given.

## 2023-03-25 NOTE — Progress Notes (Addendum)
Case: [id number] Date/Time: 03/26/23 0915   Procedure: ROBOTIC ASSISTED NAVIGATIONAL BRONCHOSCOPY (Right)   Anesthesia type: General   Diagnosis: Lung nodule [R91.1]   Pre-op diagnosis: lung nodule   [facility]   Surgeons: [name], MD       DISCUSSION: Troy [name] is an 82 yo male who is being evaluated prior to procedure above. Patient is presenting as SDW for a lung nodule which has progressed. Has a history of lung cancer status post right lower lobectomy at [facility] in 2012. Has had a thoracentesis in 2022 which was negative for malignant cells and lung nodules have been managed expectantly. Now scheduled for bronch with biopsy.  Other PMH significant for former smoking (25 pack years, quit 07/28/14), h/o PONV, HTN, GERD, PVD (s/p angioplasty and stenting 2016), stroke (2014), carotid artery stenosis (s/p right CEA 2014), OSA with CPAP, CKD, depression, anxiety, CHF, A-fib (on Eliquis), loop recorder in place, AAA (Abdominal US 10/20/17), COPD, BPH, HLD, sigmoid colon cancer (s/p LAR on 12/12/2018).   Pt last seen by cardiology 01/31/23 by Dr. Shirlee Latch for routine CHF follow up. Weight noted to be up but appeared euvolemic on exam. Has chronic DOE but otherwise noted to be stable. Has hx of A.fib and is s/p ablation in 04/2019 and redo in 01/2020. He has been primarily in NSR since redo ablation however s/p DCCV 10/23 to NSR after he was back in A.fib. He saw EP recently, poor candidate for redo ablation. Not an amiodarone candidate due to prior amiodarone-induced hyperthyroidism and his lung disease. Takes a BB for rate control. LHC in 11/18 with nonobstructive disease only. Cardiolite in 6/21 with no ischemia/infarction. On Jardiance but was not given hold instructions. Will be holding 6/24 and 6/25 dose. Discussed with Dr. Hart Rochester who states ok to proceed.  Follows with Vascular surgery for carotid artery stenosis s/p CEA, PAD s/p patch angioplasty to left  fem-pop bypass in 11/16, and AAA. Recently had an aortogram which did not show any flow-limiting stenosis. Also had recent carotid US and abdominal US. Repeat surveillance of the carotids this can be scheduled for 1 year (March 2025) and repeat AAA duplex in 6 months (Sept 2024).  COPD: No longer smoking. PFTs in 1/18 showed moderately severe obstructive defect. CT chest in 11/18 showed emphysema. COPD contributes to his dyspnea.   Pt can proceed with planned procedure barring acute status change.    VS:     03/07/2023    2:24 PM 01/31/2023    2:04 PM 01/25/2023   11:24 AM  Vitals with BMI  Height 6\' 1"   6\' 1"   Weight 182 lbs 183 lbs 182 lbs  BMI 24.02  24.02  Systolic 140 140 454  Diastolic 60 60 80  Pulse 66 63 63     PROVIDERS: PCP: Cheryll Cockayne, MD Cardiology: Marca Ancona, MD Pulmonology: Audie Box, MD   LABS: Obtain DOS (all labs ordered are listed, but only abnormal results are displayed)  Labs Reviewed - No data to display   IMAGES:  CT Chest 02/05/23:  IMPRESSION: Status post right lower lobectomy.  Chronic right pleural effusion.   Progressive band like opacity in the right upper lobe along the minor fissure, favoring recurrence.   16 mm right perifissural nodule is also progressive, suspicious for recurrence/metastasis.   Aortic Atherosclerosis (ICD10-I70.0) and Emphysema (ICD10-J43.9).     EKG 01/31/23:  NSR, rate 63 1st degree AVB RBBB     CV:  Abdominal Aortogram 12/20/22:  Findings:    Initial aortogram was performed with CO2.  Due to his abdominal aortic aneurysm the CO2 showed very poor visualization of his iliac arteries.  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  A pullback pressure was done here and the pressure difference from the left common femoral to the aorta was less than 5 mmHg.  The left common femoral to below-knee popliteal bypass is  widely patent with no focal stenosis although flow is very sluggish.  The profunda is patent with a moderate to high-grade stenosis in the proximal profunda.  Distally below the bypass there is two-vessel runoff in the peroneal and posterior tibial that are small.  Carotid US 12/13/22:  Summary:  Right Carotid: Patent right ICA endarterectomy site without evidence of restenosis.   Left Carotid: Velocities in the left ICA are consistent with a 1-39% stenosis.   Vertebrals: Bilateral vertebral arteries demonstrate antegrade flow. Subclavians: Normal flow hemodynamics were seen in bilateral subclavian arteries.   AAA Abdominal US 12/13/22:  Summary:  Abdominal Aorta: There is evidence of abnormal dilatation of the distal  Abdominal aorta. The largest aortic measurement is 4.5 cm. The largest  aortic diameter has increased compared to prior exam. Previous diameter  measurement was 3.7 cm obtained on 11/30/2021.    Echo 08/24/22:  IMPRESSIONS     1. Left ventricular ejection fraction, by estimation, is 50 to 55%. The  left ventricle has low normal function. Left ventricular endocardial  border not optimally defined to evaluate regional wall motion. Left  ventricular diastolic parameters are  indeterminate.   2. Right ventricular systolic function is normal. The right ventricular  size is normal. Tricuspid regurgitation signal is inadequate for assessing  PA pressure.   3. Left atrial size was moderately dilated.   4. Right atrial size was moderately dilated.   5. The mitral valve is abnormal. No evidence of mitral valve  regurgitation. No evidence of mitral stenosis.   6. The aortic valve was not well visualized. Aortic valve regurgitation  is not visualized. No aortic stenosis is present.   7. The inferior vena cava is dilated in size with <50% respiratory  variability, suggesting right atrial pressure of 15 mmHg.   8. Difficult study; images largerly done in the subcostal view.    Lexiscan Stress Test 03/29/20:  Addendum by Chilton Si, MD on Wed Mar 30, 2020 11:27 AM Nuclear stress EF: 70%. There was no ST segment deviation noted during stress. The study is normal. This is a low risk study. The left ventricular ejection fraction is hyperdynamic (>65%).     Past Medical History:  Diagnosis Date   AAA (abdominal aortic aneurysm) (HCC) 10-20-17   MONITORED BY DR Edilia Bo, 3.7 CM by ABDOMINAL US 11/2021   Anxiety    Arthritis    Atrial fibrillation (HCC)    Basal cell carcinoma    BPH (benign prostatic hypertrophy)    Chronic diastolic heart failure (HCC)    Chronic kidney disease    stage 3, pt unaware   Colon cancer (HCC)    COPD (chronic obstructive pulmonary disease) (HCC)    Depression    GERD (gastroesophageal reflux disease)    Glaucoma BOTH EYES   Dr Nile Riggs   History of lung cancer APRIL 2012   ONCOLOGIST- DR Mariel Sleet  LOV IN [facility] 10-27-2012 SQUAMOUS CELL---- S/P RIGHT LOWER LOBECTOMY AT [facility] -- NO CHEMORADIATION--- NO  RECURRENCE   Hx of adenomatous colonic polyps 2005    X 2; 1 hyperplastic polyp; Dr Juanda Chance   Hyperlipidemia    Hypertension    OSA (obstructive sleep apnea) 08/29/2015   CPAP SET ON 10   Peripheral vascular disease (HCC)    FOLLOWED  BY DR DICKSON   Spinal stenosis 06/2014   Thoracic aorta atherosclerosis (HCC)    Thyrotoxicosis    amiodarone induced    Past Surgical History:  Procedure Laterality Date   ABDOMINAL AORTOGRAM W/LOWER EXTREMITY N/A 12/20/2022   Procedure: ABDOMINAL AORTOGRAM W/LOWER EXTREMITY;  Surgeon: Cephus Shelling, MD;  [facility];  Service: Cardiovascular;  Laterality: N/A;   ANGIO PLASTY     X 4 in legs   AORTOGRAM  07-27-2002   MILD DIFFUSE ILIAC ARTERY OCCLUSIVE DISEASE /  LEFT RENAL ARTERY 20%/ PATENT LEFT FEM-POP GRAFT/ MILD SFA AND POPLITEAL ARTERY OCCLUSIVE DISEASE W/ SEVERE KIDNEY OCCLUSIVE DISEASE   ATRIAL FIBRILLATION ABLATION N/A 04/09/2019   Procedure: ATRIAL  FIBRILLATION ABLATION;  Surgeon: Hillis Range, MD;  [facility];  Service: Cardiovascular;  Laterality: N/A;   ATRIAL FIBRILLATION ABLATION N/A 02/12/2020   Procedure: ATRIAL FIBRILLATION ABLATION;  Surgeon: Hillis Range, MD;  [facility];  Service: Cardiovascular;  Laterality: N/A;   BALLOON DILATION N/A 02/06/2022   Procedure: BALLOON Tamponade ampulla;  Surgeon: Lemar Lofty., MD;  Location: [facility] ENDOSCOPY;  Service: Gastroenterology;  Laterality: N/A;   BASAL CELL CARCINOMA EXCISION     MULTIPLE TIMES--  RIGHT FOREARM, CHEEKS, AND BACK   BIOPSY  06/25/2018   Procedure: BIOPSY;  Surgeon: Meridee Score Netty Starring., MD;  Location: [facility] ENDOSCOPY;  Service: Gastroenterology;;   BIOPSY  02/06/2022   Procedure: BIOPSY;  Surgeon: Lemar Lofty., MD;  [facility];  Service: Gastroenterology;;   CARDIOVASCULAR STRESS TEST  12-08-2012  DR [facility]   NORMAL LEXISCAN WITH NO EXERCISE NUCLEAR STUDY/ EF 66%/   NO ISCHEMIA/ NO SIGNIFICANT CHANGE FROM PRIOR STUDY   CARDIOVERSION N/A 02/14/2018   Procedure: CARDIOVERSION;  Surgeon: Laurey Morale, MD;  Location: [facility] ENDOSCOPY;  Service: Cardiovascular;  Laterality: N/A;   CARDIOVERSION N/A 07/10/2022   Procedure: CARDIOVERSION;  Surgeon: Laurey Morale, MD;  Location: [facility] ENDOSCOPY;  Service: Cardiovascular;  Laterality: N/A;   CAROTID ANGIOGRAM N/A 07/10/2013   Procedure: CAROTID ANGIOGRAM;  Surgeon: Sherren Kerns, MD;  Location: [facility] CATH LAB;  Service: Cardiovascular;  Laterality: N/A;   CAROTID ENDARTERECTOMY Right 07-14-13   cea   CATARACT EXTRACTION W/ INTRAOCULAR LENS  IMPLANT, BILATERAL     colon polyectomy     COLONOSCOPY     COLONOSCOPY WITH PROPOFOL N/A 06/25/2018   Procedure: COLONOSCOPY WITH PROPOFOL;  Surgeon: Meridee Score Netty Starring., MD;  Location: [facility] ENDOSCOPY;  Service: Gastroenterology;  Laterality: N/A;   CYSTOSCOPY W/ RETROGRADES Bilateral 01/21/2013   Procedure: CYSTOSCOPY WITH  RETROGRADE PYELOGRAM;  Surgeon: Milford Cage, MD;  Location: [facility];  Service: Urology;  Laterality: Bilateral;   CYSTO, BLADDER BIOPSY, BILATERAL RETROGRADE PYELOGRAM  RAD TECH FROM RADIOLOGY PER JOY   CYSTOSCOPY WITH BIOPSY N/A 01/21/2013   Procedure: CYSTOSCOPY WITH BIOPSY;  Surgeon: Milford Cage, MD;  Location: [facility];  Service: Urology;  Laterality: N/A;   ENDARTERECTOMY Right 07/14/2013   Procedure: ENDARTERECTOMY CAROTID;  Surgeon: Chuck Hint, MD;  Location: [facility] OR;  Service: Vascular;  Laterality: Right;   ENDOSCOPIC RETROGRADE CHOLANGIOPANCREATOGRAPHY (ERCP) WITH PROPOFOL N/A 02/06/2022  Procedure: ENDOSCOPIC RETROGRADE CHOLANGIOPANCREATOGRAPHY (ERCP) WITH PROPOFOL;  Surgeon: Meridee Score Netty Starring., MD;  [facility];  Service: Gastroenterology;  Laterality: N/A;   EP IMPLANTABLE DEVICE N/A 08/12/2015   Procedure: Loop Recorder Insertion;  Surgeon: Hillis Range, MD;  [facility];  Service: Cardiovascular;  Laterality: N/A;   ESOPHAGOGASTRODUODENOSCOPY Left 02/13/2022   Procedure: ESOPHAGOGASTRODUODENOSCOPY (EGD);  Surgeon: Shellia Cleverly, DO;  Location: [facility] ENDOSCOPY;  Service: Gastroenterology;  Laterality: Left;   ESOPHAGOGASTRODUODENOSCOPY (EGD) WITH PROPOFOL N/A 06/25/2018   Procedure: ESOPHAGOGASTRODUODENOSCOPY (EGD) WITH PROPOFOL;  Surgeon: Meridee Score Netty Starring., MD;  Location: [facility] ENDOSCOPY;  Service: Gastroenterology;  Laterality: N/A;   ESOPHAGOGASTRODUODENOSCOPY (EGD) WITH PROPOFOL N/A 07/10/2018   Procedure: ESOPHAGOGASTRODUODENOSCOPY (EGD) WITH PROPOFOL;  Surgeon: Rachael Fee, MD;  [facility];  Service: Endoscopy;  Laterality: N/A;   EUS N/A 07/10/2018   Procedure: UPPER ENDOSCOPIC ULTRASOUND (EUS) RADIAL;  Surgeon: Rachael Fee, MD;  [facility];  Service: Endoscopy;  Laterality: N/A;   EYE SURGERY Right    FEMORAL-POPLITEAL BYPASS GRAFT Left 1994   [facility]   AND 2001  IN [place]   FEMORAL-POPLITEAL BYPASS GRAFT Left 08/30/2015   Procedure: REVISION OF BYPASS GRAFT Left  FEMORAL-POPLITEAL ARTERY;  Surgeon: Chuck Hint, MD;  Location: [facility] OR;  Service: Vascular;  Laterality: Left;   FINE NEEDLE ASPIRATION N/A 07/10/2018   Procedure: FINE NEEDLE ASPIRATION (FNA) LINEAR;  Surgeon: Rachael Fee, MD;  [facility];  Service: Endoscopy;  Laterality: N/A;   FLEXIBLE SIGMOIDOSCOPY N/A 12/12/2018   Procedure: FLEXIBLE SIGMOIDOSCOPY;  Surgeon: Andria Meuse, MD;  [facility] ORS;  Service: General;  Laterality: N/A;   HEMOSTASIS CLIP PLACEMENT  02/13/2022   Procedure: HEMOSTASIS CLIP PLACEMENT;  Surgeon: Shellia Cleverly, DO;  [facility];  Service: Gastroenterology;;   HOT HEMOSTASIS N/A 02/13/2022   Procedure: HOT HEMOSTASIS (ARGON PLASMA COAGULATION/BICAP);  Surgeon: Shellia Cleverly, DO;  Location: [facility] ENDOSCOPY;  Service: Gastroenterology;  Laterality: N/A;   LARYNGOSCOPY  06-27-2004   BX VOCAL CORD  (LEUKOPLAKIA)  PER PT NO ISSUES SINCE   LEFT HEART CATH AND CORONARY ANGIOGRAPHY N/A 08/20/2017   Procedure: LEFT HEART CATH AND CORONARY ANGIOGRAPHY;  Surgeon: Laurey Morale, MD;  Location: [facility] INVASIVE CV LAB;  Service: Cardiovascular;  Laterality: N/A;   LOOP RECORDER INSERTION N/A 04/09/2019   Procedure: LOOP RECORDER INSERTION;  Surgeon: Hillis Range, MD;  [facility];  Service: Cardiovascular;  Laterality: N/A;   LOOP RECORDER REMOVAL N/A 04/09/2019   Procedure: LOOP RECORDER REMOVAL;  Surgeon: Hillis Range, MD;  [facility];  Service: Cardiovascular;  Laterality: N/A;   LOWER EXTREMITY ANGIOGRAM Bilateral 08/29/2015   Procedure: Lower Extremity Angiogram;  Surgeon: Chuck Hint, MD;  Location: [facility] INVASIVE CV LAB;  Service: Cardiovascular;  Laterality: Bilateral;   LUNG LOBECTOMY  01/24/2011    RIGHT UPPER LOBE  (SQUAMOUS CELL CARCINOMA) Dr Desmond Lope ,  [facility]. No chemotherapyor radiation   PANCREATIC STENT PLACEMENT  02/06/2022   Procedure: PANCREATIC STENT PLACEMENT;  Surgeon: Meridee Score Netty Starring., MD;  Location: [facility] ENDOSCOPY;  Service: Gastroenterology;;   [facility] ANGIOPLASTY Right 07/14/2013   Procedure: PATCH ANGIOPLASTY;  Surgeon: Chuck Hint, MD;  Location: [facility] OR;  Service: Vascular;  Laterality: Right;   PATCH ANGIOPLASTY Left 08/30/2015   Procedure: VEIN PATCH ANGIOPLASTY OF PROXIMAL Left BYPASS GRAFT;  Surgeon: Chuck Hint, MD;  Location: [facility] OR;  Service: Vascular;  Laterality: Left;  PERIPHERAL VASCULAR CATHETERIZATION N/A 08/29/2015   Procedure: Abdominal Aortogram;  Surgeon: Chuck Hint, MD;  Location: [facility] INVASIVE CV LAB;  Service: Cardiovascular;  Laterality: N/A;   POLYPECTOMY  06/25/2018   Procedure: POLYPECTOMY;  Surgeon: Meridee Score Netty Starring., MD;  Location: [facility] ENDOSCOPY;  Service: Gastroenterology;;   POLYPECTOMY     POLYPECTOMY  02/13/2022   Procedure: POLYPECTOMY;  Surgeon: Shellia Cleverly, DO;  [facility];  Service: Gastroenterology;;   REMOVAL OF STONES  02/06/2022   Procedure: REMOVAL OF STONES;  Surgeon: Lemar Lofty., MD;  Location: [facility] ENDOSCOPY;  Service: Gastroenterology;;   Susa Day  02/13/2022   Procedure: Susa Day;  Surgeon: Shellia Cleverly, DO;  Location: [facility] ENDOSCOPY;  Service: Gastroenterology;;   Dennison Mascot  02/06/2022   Procedure: Dennison Mascot;  Surgeon: Lemar Lofty., MD;  Location: [facility] ENDOSCOPY;  Service: Gastroenterology;;   SUBMUCOSAL INJECTION  06/25/2018   Procedure: SUBMUCOSAL INJECTION;  Surgeon: Lemar Lofty., MD;  Location: [facility] ENDOSCOPY;  Service: Gastroenterology;;   TEE WITHOUT CARDIOVERSION N/A 02/14/2018   Procedure: TRANSESOPHAGEAL ECHOCARDIOGRAM (TEE);  Surgeon: Laurey Morale, MD;  Location: [facility] ENDOSCOPY;  Service: Cardiovascular;  Laterality: N/A;   trabecular surgery     OS   TRANSTHORACIC  ECHOCARDIOGRAM  12-29-2012  DR [facility]   MILD LVH/  LVSF NORMAL/ EF 55-60%/  GRADE I DIASTOLIC DYSFUNCTION    MEDICATIONS: No current facility-administered medications for this encounter.    acetaminophen (TYLENOL) 325 MG tablet   albuterol (VENTOLIN HFA) 108 (90 Base) MCG/ACT inhaler   amLODipine (NORVASC) 10 MG tablet   apixaban (ELIQUIS) 2.5 MG TABS tablet   Brinzolamide-Brimonidine (SIMBRINZA) 1-0.2 % SUSP   Budeson-Glycopyrrol-Formoterol (BREZTRI AEROSPHERE) 160-9-4.8 MCG/ACT AERO   docusate sodium (COLACE) 100 MG capsule   escitalopram (LEXAPRO) 10 MG tablet   furosemide (LASIX) 20 MG tablet   JARDIANCE 10 MG TABS tablet   latanoprost (XALATAN) 0.005 % ophthalmic solution   Melatonin 10 MG TABS   metoprolol succinate (TOPROL-XL) 25 MG 24 hr tablet   montelukast (SINGULAIR) 10 MG tablet   omeprazole (PRILOSEC OTC) 20 MG tablet   Polyethyl Glycol-Propyl Glycol (SYSTANE ULTRA) 0.4-0.3 % SOLN   potassium chloride SA (KLOR-CON M) 20 MEQ tablet   PRESCRIPTION MEDICATION   rosuvastatin (CRESTOR) 40 MG tablet   senna-docusate (SENOKOT-S) 8.6-50 MG tablet   spironolactone (ALDACTONE) 25 MG tablet   Ubaldo Glassing, PA-C [facility] Surgical Short Stay/Anesthesiology [facility] Phone [phone] 03/25/2023 1:47 PM

## 2023-03-26 ENCOUNTER — Ambulatory Visit (HOSPITAL_COMMUNITY): Payer: PPO | Admitting: Medical

## 2023-03-26 ENCOUNTER — Ambulatory Visit (HOSPITAL_BASED_OUTPATIENT_CLINIC_OR_DEPARTMENT_OTHER): Payer: PPO | Admitting: Medical

## 2023-03-26 ENCOUNTER — Other Ambulatory Visit: Payer: Self-pay

## 2023-03-26 ENCOUNTER — Ambulatory Visit (HOSPITAL_COMMUNITY): Payer: PPO

## 2023-03-26 ENCOUNTER — Encounter (HOSPITAL_COMMUNITY): Payer: Self-pay | Admitting: Emergency Medicine

## 2023-03-26 ENCOUNTER — Encounter (HOSPITAL_COMMUNITY)
Admission: RE | Disposition: A | Discharge status: Home / Self Care | Payer: Self-pay | Source: Ambulatory Visit | Attending: Emergency Medicine

## 2023-03-26 ENCOUNTER — Ambulatory Visit (HOSPITAL_COMMUNITY)
Admission: RE | Admit: 2023-03-26 | Discharge: 2023-03-26 | Disposition: A | Discharge status: Home / Self Care | Payer: PPO | Source: Ambulatory Visit | Attending: Emergency Medicine | Admitting: Emergency Medicine

## 2023-03-26 DIAGNOSIS — J439 Emphysema, unspecified: Secondary | ICD-10-CM | POA: Diagnosis not present

## 2023-03-26 DIAGNOSIS — Z7984 Long term (current) use of oral hypoglycemic drugs: Secondary | ICD-10-CM | POA: Insufficient documentation

## 2023-03-26 DIAGNOSIS — Z87891 Personal history of nicotine dependence: Secondary | ICD-10-CM | POA: Diagnosis not present

## 2023-03-26 DIAGNOSIS — J984 Other disorders of lung: Secondary | ICD-10-CM | POA: Diagnosis not present

## 2023-03-26 DIAGNOSIS — Z902 Acquired absence of lung [part of]: Secondary | ICD-10-CM | POA: Diagnosis not present

## 2023-03-26 DIAGNOSIS — F32A Depression, unspecified: Secondary | ICD-10-CM | POA: Insufficient documentation

## 2023-03-26 DIAGNOSIS — Z7901 Long term (current) use of anticoagulants: Secondary | ICD-10-CM | POA: Diagnosis not present

## 2023-03-26 DIAGNOSIS — I2510 Atherosclerotic heart disease of native coronary artery without angina pectoris: Secondary | ICD-10-CM | POA: Diagnosis not present

## 2023-03-26 DIAGNOSIS — Z85038 Personal history of other malignant neoplasm of large intestine: Secondary | ICD-10-CM | POA: Diagnosis not present

## 2023-03-26 DIAGNOSIS — G4733 Obstructive sleep apnea (adult) (pediatric): Secondary | ICD-10-CM | POA: Diagnosis not present

## 2023-03-26 DIAGNOSIS — I509 Heart failure, unspecified: Secondary | ICD-10-CM | POA: Diagnosis not present

## 2023-03-26 DIAGNOSIS — Z08 Encounter for follow-up examination after completed treatment for malignant neoplasm: Secondary | ICD-10-CM | POA: Diagnosis not present

## 2023-03-26 DIAGNOSIS — E1151 Type 2 diabetes mellitus with diabetic peripheral angiopathy without gangrene: Secondary | ICD-10-CM | POA: Insufficient documentation

## 2023-03-26 DIAGNOSIS — Z85118 Personal history of other malignant neoplasm of bronchus and lung: Secondary | ICD-10-CM | POA: Diagnosis not present

## 2023-03-26 DIAGNOSIS — E1122 Type 2 diabetes mellitus with diabetic chronic kidney disease: Secondary | ICD-10-CM | POA: Diagnosis not present

## 2023-03-26 DIAGNOSIS — E785 Hyperlipidemia, unspecified: Secondary | ICD-10-CM | POA: Diagnosis not present

## 2023-03-26 DIAGNOSIS — I4891 Unspecified atrial fibrillation: Secondary | ICD-10-CM | POA: Diagnosis not present

## 2023-03-26 DIAGNOSIS — C3411 Malignant neoplasm of upper lobe, right bronchus or lung: Secondary | ICD-10-CM | POA: Diagnosis not present

## 2023-03-26 DIAGNOSIS — Z9889 Other specified postprocedural states: Secondary | ICD-10-CM | POA: Insufficient documentation

## 2023-03-26 DIAGNOSIS — I5032 Chronic diastolic (congestive) heart failure: Secondary | ICD-10-CM | POA: Insufficient documentation

## 2023-03-26 DIAGNOSIS — R911 Solitary pulmonary nodule: Secondary | ICD-10-CM | POA: Diagnosis not present

## 2023-03-26 DIAGNOSIS — Z09 Encounter for follow-up examination after completed treatment for conditions other than malignant neoplasm: Secondary | ICD-10-CM | POA: Insufficient documentation

## 2023-03-26 DIAGNOSIS — I130 Hypertensive heart and chronic kidney disease with heart failure and stage 1 through stage 4 chronic kidney disease, or unspecified chronic kidney disease: Secondary | ICD-10-CM | POA: Diagnosis not present

## 2023-03-26 DIAGNOSIS — F419 Anxiety disorder, unspecified: Secondary | ICD-10-CM | POA: Insufficient documentation

## 2023-03-26 DIAGNOSIS — R918 Other nonspecific abnormal finding of lung field: Secondary | ICD-10-CM | POA: Diagnosis not present

## 2023-03-26 DIAGNOSIS — Z79899 Other long term (current) drug therapy: Secondary | ICD-10-CM | POA: Insufficient documentation

## 2023-03-26 DIAGNOSIS — Z8673 Personal history of transient ischemic attack (TIA), and cerebral infarction without residual deficits: Secondary | ICD-10-CM | POA: Insufficient documentation

## 2023-03-26 DIAGNOSIS — J449 Chronic obstructive pulmonary disease, unspecified: Secondary | ICD-10-CM

## 2023-03-26 DIAGNOSIS — I110 Hypertensive heart disease with heart failure: Secondary | ICD-10-CM | POA: Diagnosis not present

## 2023-03-26 DIAGNOSIS — K219 Gastro-esophageal reflux disease without esophagitis: Secondary | ICD-10-CM | POA: Insufficient documentation

## 2023-03-26 DIAGNOSIS — N183 Chronic kidney disease, stage 3 unspecified: Secondary | ICD-10-CM | POA: Insufficient documentation

## 2023-03-26 DIAGNOSIS — N400 Enlarged prostate without lower urinary tract symptoms: Secondary | ICD-10-CM | POA: Insufficient documentation

## 2023-03-26 HISTORY — PX: VIDEO BRONCHOSCOPY WITH RADIAL ENDOBRONCHIAL ULTRASOUND: SHX6849

## 2023-03-26 HISTORY — PX: HEMOSTASIS CONTROL: SHX6838

## 2023-03-26 HISTORY — PX: BRONCHIAL NEEDLE ASPIRATION BIOPSY: SHX5106

## 2023-03-26 HISTORY — PX: BRONCHIAL WASHINGS: SHX5105

## 2023-03-26 HISTORY — PX: BRONCHIAL BIOPSY: SHX5109

## 2023-03-26 HISTORY — PX: BRONCHIAL BRUSHINGS: SHX5108

## 2023-03-26 LAB — CBC
HCT: 39.7 % (ref 39.0–52.0)
Hemoglobin: 12.8 g/dL — ABNORMAL LOW (ref 13.0–17.0)
MCH: 29.1 pg (ref 26.0–34.0)
MCHC: 32.2 g/dL (ref 30.0–36.0)
MCV: 90.2 fL (ref 80.0–100.0)
Platelets: 315 10*3/uL (ref 150–400)
RBC: 4.4 MIL/uL (ref 4.22–5.81)
RDW: 14.8 % (ref 11.5–15.5)
WBC: 5 10*3/uL (ref 4.0–10.5)
nRBC: 0 % (ref 0.0–0.2)

## 2023-03-26 LAB — BASIC METABOLIC PANEL
Anion gap: 12 (ref 5–15)
BUN: 16 mg/dL (ref 8–23)
CO2: 21 mmol/L — ABNORMAL LOW (ref 22–32)
Calcium: 9 mg/dL (ref 8.9–10.3)
Chloride: 101 mmol/L (ref 98–111)
Creatinine, Ser: 1.36 mg/dL — ABNORMAL HIGH (ref 0.61–1.24)
GFR, Estimated: 52 mL/min — ABNORMAL LOW (ref >60)
Glucose, Bld: 138 mg/dL — ABNORMAL HIGH (ref 70–99)
Potassium: 3.6 mmol/L (ref 3.5–5.1)
Sodium: 134 mmol/L — ABNORMAL LOW (ref 135–145)

## 2023-03-26 LAB — CULTURE, BAL-QUANTITATIVE W GRAM STAIN

## 2023-03-26 SURGERY — BRONCHOSCOPY, WITH BIOPSY USING ELECTROMAGNETIC NAVIGATION
Anesthesia: General | Laterality: Right

## 2023-03-26 MED ORDER — FENTANYL CITRATE (PF) 250 MCG/5ML IJ SOLN
INTRAMUSCULAR | Status: DC | PRN
  Administered 2023-03-26: 50 ug via INTRAVENOUS

## 2023-03-26 MED ORDER — ACETAMINOPHEN 325 MG PO TABS
ORAL_TABLET | ORAL | Status: AC
  Filled 2023-03-26: qty 2

## 2023-03-26 MED ORDER — PROMETHAZINE HCL 25 MG/ML IJ SOLN: 6.2500 mg | INTRAMUSCULAR | Status: DC | PRN

## 2023-03-26 MED ORDER — PHENYLEPHRINE HCL-NACL 20-0.9 MG/250ML-% IV SOLN
INTRAVENOUS | Status: DC | PRN
  Administered 2023-03-26: 50 ug/min via INTRAVENOUS

## 2023-03-26 MED ORDER — OXYCODONE HCL 5 MG PO TABS: 5.0000 mg | ORAL_TABLET | Freq: Once | ORAL | Status: DC | PRN

## 2023-03-26 MED ORDER — ONDANSETRON HCL 4 MG/2ML IJ SOLN
INTRAMUSCULAR | Status: DC | PRN
  Administered 2023-03-26: 4 mg via INTRAVENOUS

## 2023-03-26 MED ORDER — CHLORHEXIDINE GLUCONATE 0.12 % MT SOLN: 15.0000 mL | Freq: Once | OROMUCOSAL | Status: AC

## 2023-03-26 MED ORDER — PROPOFOL 10 MG/ML IV BOLUS
INTRAVENOUS | Status: DC | PRN
  Administered 2023-03-26: 140 mg via INTRAVENOUS

## 2023-03-26 MED ORDER — ROCURONIUM BROMIDE 10 MG/ML (PF) SYRINGE
PREFILLED_SYRINGE | INTRAVENOUS | Status: DC | PRN
  Administered 2023-03-26: 5 mg via INTRAVENOUS
  Administered 2023-03-26: 20 mg via INTRAVENOUS

## 2023-03-26 MED ORDER — LIDOCAINE 2% (20 MG/ML) 5 ML SYRINGE
INTRAMUSCULAR | Status: DC | PRN
  Administered 2023-03-26: 80 mg via INTRAVENOUS

## 2023-03-26 MED ORDER — EPINEPHRINE 1 MG/10ML IJ SOSY
PREFILLED_SYRINGE | INTRAMUSCULAR | Status: DC | PRN
  Administered 2023-03-26: .4 mg via ENDOTRACHEOPULMONARY

## 2023-03-26 MED ORDER — AMISULPRIDE (ANTIEMETIC) 5 MG/2ML IV SOLN: 10.0000 mg | Freq: Once | INTRAVENOUS | Status: DC | PRN

## 2023-03-26 MED ORDER — SUGAMMADEX SODIUM 200 MG/2ML IV SOLN
INTRAVENOUS | Status: DC | PRN
  Administered 2023-03-26: 200 mg via INTRAVENOUS

## 2023-03-26 MED ORDER — EPHEDRINE SULFATE-NACL 50-0.9 MG/10ML-% IV SOSY
PREFILLED_SYRINGE | INTRAVENOUS | Status: DC | PRN
  Administered 2023-03-26: 10 mg via INTRAVENOUS

## 2023-03-26 MED ORDER — SUCCINYLCHOLINE CHLORIDE 200 MG/10ML IV SOSY
PREFILLED_SYRINGE | INTRAVENOUS | Status: DC | PRN
  Administered 2023-03-26: 140 mg via INTRAVENOUS

## 2023-03-26 MED ORDER — CHLORHEXIDINE GLUCONATE 0.12 % MT SOLN
OROMUCOSAL | Status: AC
  Administered 2023-03-26: 15 mL via OROMUCOSAL
  Filled 2023-03-26: qty 15

## 2023-03-26 MED ORDER — APIXABAN 2.5 MG PO TABS: 2.5000 mg | ORAL_TABLET | Freq: Two times a day (BID) | ORAL | Status: AC

## 2023-03-26 MED ORDER — ACETAMINOPHEN 325 MG PO TABS
650.0000 mg | ORAL_TABLET | Freq: Once | ORAL | Status: AC
  Administered 2023-03-26: 650 mg via ORAL

## 2023-03-26 MED ORDER — HYDROMORPHONE HCL 1 MG/ML IJ SOLN: 0.2500 mg | INTRAMUSCULAR | Status: DC | PRN

## 2023-03-26 MED ORDER — LACTATED RINGERS IV SOLN: INTRAVENOUS | Status: DC

## 2023-03-26 MED ORDER — OXYCODONE HCL 5 MG/5ML PO SOLN: 5.0000 mg | Freq: Once | ORAL | Status: DC | PRN

## 2023-03-26 NOTE — Op Note (Signed)
Video Bronchoscopy with Robotic Assisted Bronchoscopic Navigation   Date of Operation: 03/26/2023   Pre-op Diagnosis: Right middle lobe opacity, right upper lobe nodule  Post-op Diagnosis: Same  Surgeon: Levy Pupa  Assistants: None  Anesthesia: General endotracheal anesthesia  Operation: Flexible video fiberoptic bronchoscopy with robotic assistance and biopsies.  Estimated Blood Loss: Minimal  Complications: None  Indications and History: Troy Nikolai [name] is a 82 y.o. male with history of non-small cell lung cancer post right lower lobe lobectomy, colon cancer.  He has been found to have a somewhat linear right upper lobe opacity and adjacent right upper lobe pulmonary nodule.  Recommendation made to achieve a tissue diagnosis and obtain culture data via robotic assisted navigational bronchoscopy.  The risks, benefits, complications, treatment options and expected outcomes were discussed with the patient.  The possibilities of pneumothorax, pneumonia, reaction to medication, pulmonary aspiration, perforation of a viscus, bleeding, failure to diagnose a condition and creating a complication requiring transfusion or operation were discussed with the patient who freely signed the consent.    Description of Procedure: The patient was seen in the Preoperative Area, was examined and was deemed appropriate to proceed.  The patient was taken to [facility] endoscopy room 3, identified as Troy Bruce [name] and the procedure verified as Flexible Video Fiberoptic Bronchoscopy.  A Time Out was held and the above information confirmed.   Prior to the date of the procedure a high-resolution CT scan of the chest was performed. Utilizing ION software program a virtual tracheobronchial tree was generated to allow the creation of distinct navigation pathways to the patient's parenchymal abnormalities. After being taken to the operating room general anesthesia was initiated and the patient  was orally  intubated. The video fiberoptic bronchoscope was introduced via the endotracheal tube and a general inspection was performed which showed normal left lung anatomy.  The right lower lobe was surgically absent with a good suture line and stump present.  Right upper lobe and right middle lobe airways were normal.  There was some raised irregular mucosa in the distal bronchus intermedius.  Endobronchial brushings and endobronchial forceps biopsies were performed at this location for cytology and pathology.  Aspiration of the bilateral mainstems was completed to remove any remaining secretions. Robotic catheter inserted into patient's endotracheal tube.   Target #1 right upper lobe opacity: The distinct navigation pathways prepared prior to this procedure were then utilized to navigate to patient's lesion identified on CT scan. The robotic catheter was secured into place and the vision probe was withdrawn.  Lesion location was approximated using fluoroscopy and radial endobronchial ultrasound for peripheral targeting. Under fluoroscopic guidance transbronchial needle brushings, transbronchial needle biopsies, and transbronchial forceps biopsies were performed to be sent for cytology and pathology.   Target #2 right upper lobe pulmonary nodule: The distinct navigation pathways prepared prior to this procedure were then utilized to navigate to patient's lesion identified on CT scan. The robotic catheter was secured into place and the vision probe was withdrawn.  Lesion location was approximated using fluoroscopy.  Local registration and targeting was performed using Cios three-dimensional imaging. Under fluoroscopic guidance transbronchial needle brushings, transbronchial needle biopsies, and transbronchial forceps biopsies were performed to be sent for cytology and pathology. A bronchioalveolar lavage was performed in the right upper lobe adjacent to the nodule and sent for cytology.  At the end of the procedure a  general airway inspection was performed and there was no evidence of active bleeding. The bronchoscope was removed.  The patient tolerated the procedure well. There was no significant blood loss and there were no obvious complications. A post-procedural chest x-ray is pending.  Samples Target #1: 1. Transbronchial needle brushings from right upper lobe opacity 2. Transbronchial Wang needle biopsies from right upper lobe opacity 3. Transbronchial forceps biopsies from right upper lobe opacity  Samples Target #2: 1. Transbronchial needle brushings from right upper lobe nodule 2. Transbronchial Wang needle biopsies from right upper lobe nodule 3. Transbronchial forceps biopsies from right upper lobe nodule 4. Bronchoalveolar lavage from right upper lobe  Endobronchial samples: 1. Endobronchial biopsies from bronchus intermedius 2.  Endobronchial forceps biopsies from the bronchus intermedius   Plans:  The patient will be discharged from the PACU to home when recovered from anesthesia and after chest x-ray is reviewed. We will review the cytology, pathology and microbiology results with the patient when they become available. Outpatient followup will be with Dr. Tonia Brooms or S. Alexandria Lodge, NP.   Levy Pupa, MD, PhD 03/26/2023, 10:26 AM [facility] [phone] or if no answer before 7:00PM call [phone] For any issues after 7:00PM please call eLink [phone]

## 2023-03-26 NOTE — Anesthesia Postprocedure Evaluation (Signed)
Anesthesia Post Note  Patient: Troy Bruce [name]  Procedure(s) Performed: ROBOTIC ASSISTED NAVIGATIONAL BRONCHOSCOPY (Right) BRONCHIAL BRUSHINGS BRONCHIAL BIOPSIES HEMOSTASIS CONTROL VIDEO BRONCHOSCOPY WITH RADIAL ENDOBRONCHIAL ULTRASOUND BRONCHIAL NEEDLE ASPIRATION BIOPSIES BRONCHIAL WASHINGS     Patient location during evaluation: PACU Anesthesia Type: General Level of consciousness: awake and alert Pain management: pain level controlled Vital Signs Assessment: post-procedure vital signs reviewed and stable Respiratory status: spontaneous breathing, nonlabored ventilation and respiratory function stable Cardiovascular status: blood pressure returned to baseline and stable Postop Assessment: no apparent nausea or vomiting Anesthetic complications: no   No notable events documented.  Last Vitals:  Vitals:   03/26/23 1145 03/26/23 1200  BP: (!) 155/63 (!) 157/67  Pulse: (!) 54 (!) 51  Resp: 17 19  Temp:  36.7 C  SpO2: 92% 94%    Last Pain:  Vitals:   03/26/23 1100  PainSc: 0-No pain                 Lowella Curb

## 2023-03-26 NOTE — Transfer of Care (Signed)
Immediate Anesthesia Transfer of Care Note  Patient: Troy Bruce [name]  Procedure(s) Performed: ROBOTIC ASSISTED NAVIGATIONAL BRONCHOSCOPY (Right) BRONCHIAL BRUSHINGS BRONCHIAL BIOPSIES HEMOSTASIS CONTROL VIDEO BRONCHOSCOPY WITH RADIAL ENDOBRONCHIAL ULTRASOUND BRONCHIAL NEEDLE ASPIRATION BIOPSIES BRONCHIAL WASHINGS  Patient Location: PACU  Anesthesia Type:General  Level of Consciousness: drowsy  Airway & Oxygen Therapy: Patient Spontanous Breathing and Patient connected to face mask oxygen  Post-op Assessment: Report given to RN and Post -op Vital signs reviewed and stable  Post vital signs: Reviewed and stable  Last Vitals:  Vitals Value Taken Time  BP 143/67 03/26/23 1030  Temp 36.7 C 03/26/23 1030  Pulse 56 03/26/23 1035  Resp 19 03/26/23 1035  SpO2 91 % 03/26/23 1035  Vitals shown include unvalidated device data.  Last Pain:  Vitals:   03/26/23 1030  PainSc: Asleep         Complications: No notable events documented.

## 2023-03-26 NOTE — Interval H&P Note (Signed)
History and Physical Interval Note:  03/26/2023 9:03 AM  Troy Bruce [name]  has presented today for surgery, with the diagnosis of lung nodule.  The various methods of treatment have been discussed with the patient and family. After consideration of risks, benefits and other options for treatment, the patient has consented to  Procedure(s): ROBOTIC ASSISTED NAVIGATIONAL BRONCHOSCOPY (Right) as a surgical intervention.  The patient's history has been reviewed, patient examined, no change in status, stable for surgery.  I have reviewed the patient's chart and labs.  Questions were answered to the patient's satisfaction.     [name]

## 2023-03-26 NOTE — Discharge Instructions (Addendum)
Flexible Bronchoscopy, Care After This sheet gives you information about how to care for yourself after your test. Your doctor may also give you more specific instructions. If you have problems or questions, contact your doctor. Follow these instructions at home: Eating and drinking When your numbness is gone and your cough and gag reflexes have come back, you may: Eat only soft foods. Slowly drink liquids. The day after the test, go back to your normal diet. Driving Do not drive for 24 hours if you were given a medicine to help you relax (sedative). Do not drive or use heavy machinery while taking prescription pain medicine. General instructions  Take over-the-counter and prescription medicines only as told by your doctor. Return to your normal activities as told. Ask what activities are safe for you. Do not use any products that have nicotine or tobacco in them. This includes cigarettes and e-cigarettes. If you need help quitting, ask your doctor. Keep all follow-up visits as told by your doctor. This is important. It is very important if you had a tissue sample (biopsy) taken. Get help right away if: You have shortness of breath that gets worse. You get light-headed. You feel like you are going to pass out (faint). You have chest pain. You cough up: More than a little blood. More blood than before. Summary Do not eat or drink anything (not even water) for 2 hours after your test, or until your numbing medicine wears off. Do not use cigarettes. Do not use e-cigarettes. Get help right away if you have chest pain.  Please call our office for any questions or concerns.  [phone].  Okay to restart your Eliquis on 03/27/2023.  This information is not intended to replace advice given to you by your health care provider. Make sure you discuss any questions you have with your health care provider. Document Released: 07/15/2009 Document Revised: 08/30/2017 Document Reviewed:  10/05/2016 Elsevier Patient Education  2020 [employer].

## 2023-03-26 NOTE — Anesthesia Procedure Notes (Signed)
Procedure Name: Intubation Date/Time: 03/26/2023 9:20 AM  Performed by: Loleta Keatin Benham, CRNAPre-anesthesia Checklist: Patient identified, Patient being monitored, Timeout performed, Emergency Drugs available and Suction available Patient Re-evaluated:Patient Re-evaluated prior to induction Oxygen Delivery Method: Circle system utilized Preoxygenation: Pre-oxygenation with 100% oxygen Induction Type: IV induction Ventilation: Mask ventilation without difficulty Laryngoscope Size: Mac and 3 Grade View: Grade I Tube type: Oral Tube size: 8.5 mm Number of attempts: 1 Airway Equipment and Method: Stylet Placement Confirmation: ETT inserted through vocal cords under direct vision, positive ETCO2 and breath sounds checked- equal and bilateral Secured at: 23 cm Tube secured with: Tape Dental Injury: Teeth and Oropharynx as per pre-operative assessment

## 2023-03-27 LAB — CYTOLOGY - NON PAP

## 2023-03-27 LAB — AEROBIC/ANAEROBIC CULTURE W GRAM STAIN (SURGICAL/DEEP WOUND)

## 2023-03-27 LAB — CULTURE, BAL-QUANTITATIVE W GRAM STAIN

## 2023-03-28 LAB — CULTURE, BAL-QUANTITATIVE W GRAM STAIN: Culture: NO GROWTH

## 2023-03-28 LAB — ACID FAST SMEAR (AFB, MYCOBACTERIA): Acid Fast Smear: NEGATIVE

## 2023-03-28 LAB — SURGICAL PATHOLOGY

## 2023-03-29 ENCOUNTER — Encounter (HOSPITAL_COMMUNITY): Payer: Self-pay | Admitting: Emergency Medicine

## 2023-03-29 LAB — AEROBIC/ANAEROBIC CULTURE W GRAM STAIN (SURGICAL/DEEP WOUND)

## 2023-03-30 DIAGNOSIS — K219 Gastro-esophageal reflux disease without esophagitis: Secondary | ICD-10-CM | POA: Diagnosis not present

## 2023-03-30 DIAGNOSIS — R2681 Unsteadiness on feet: Secondary | ICD-10-CM | POA: Diagnosis not present

## 2023-03-30 DIAGNOSIS — R531 Weakness: Secondary | ICD-10-CM | POA: Diagnosis not present

## 2023-03-30 DIAGNOSIS — J449 Chronic obstructive pulmonary disease, unspecified: Secondary | ICD-10-CM | POA: Diagnosis not present

## 2023-03-30 DIAGNOSIS — I509 Heart failure, unspecified: Secondary | ICD-10-CM | POA: Diagnosis not present

## 2023-03-30 LAB — AEROBIC/ANAEROBIC CULTURE W GRAM STAIN (SURGICAL/DEEP WOUND): Gram Stain: NONE SEEN

## 2023-03-30 LAB — FUNGUS CULTURE RESULT

## 2023-03-30 LAB — FUNGUS CULTURE WITH STAIN

## 2023-03-30 NOTE — Progress Notes (Signed)
Procedure completed by RB. Patient seeing SG in office on 7/2. Please keep appt. Would consider referral to rad onc.   Thanks,  BLI  Josephine [name], DO [facility] 03/30/2023 2:38 PM

## 2023-03-31 LAB — AEROBIC/ANAEROBIC CULTURE W GRAM STAIN (SURGICAL/DEEP WOUND): Culture: NO GROWTH

## 2023-04-02 ENCOUNTER — Encounter: Payer: Self-pay | Admitting: Acute Care

## 2023-04-02 ENCOUNTER — Ambulatory Visit: Payer: PPO | Admitting: Acute Care

## 2023-04-02 VITALS — BP 132/70 | HR 51 | Temp 98.5°F | Ht 73.0 in | Wt 175.0 lb

## 2023-04-02 DIAGNOSIS — Z9889 Other specified postprocedural states: Secondary | ICD-10-CM | POA: Diagnosis not present

## 2023-04-02 DIAGNOSIS — Z87891 Personal history of nicotine dependence: Secondary | ICD-10-CM | POA: Diagnosis not present

## 2023-04-02 DIAGNOSIS — C3490 Malignant neoplasm of unspecified part of unspecified bronchus or lung: Secondary | ICD-10-CM | POA: Diagnosis not present

## 2023-04-02 DIAGNOSIS — Z85118 Personal history of other malignant neoplasm of bronchus and lung: Secondary | ICD-10-CM

## 2023-04-02 DIAGNOSIS — Z85038 Personal history of other malignant neoplasm of large intestine: Secondary | ICD-10-CM

## 2023-04-02 NOTE — Patient Instructions (Addendum)
We have reviewed you biopsy results. They were suspicious for non small cell lung cancer I have referred you radiation oncology and for treatment options, as well as surgery and you have an appointment with Dr. Truett Perna 04/16/2023.  Based on the recommendation of these doctors, you can choose your best option for treatment. Follow up as needed. Please contact office for sooner follow up if symptoms do not improve or worsen or seek emergency care

## 2023-04-02 NOTE — Progress Notes (Signed)
History of Present Illness Troy Cardoni [name] is a 82 y.o. male former smoker ( quit 10/29/2023 with a 25 pack year smoking history) with past medical history of atrial fibrillation, AAA, BPH, chronic diastolic heart failure, emphysema, history of lung cancer, April 2012, squamous cell carcinoma status post right lower lobectomy at [facility], no chemo no radiation , hyperlipidemia, hypertension. He also has a history of colon cancer. He was referred to Dr. Tonia Brooms 6/22 for abnormal CT Chest by Dr Truett Perna who follows him closely from a medical oncology standpoint. He has had a slowly growing RUL pulmonary nodule.   Synopsis 04/02/2023 Pt.presents for follow up after Flexible video fiberoptic bronchoscopy with robotic assistance and biopsies on 03/26/2023. [name]  He states he has done well since the procedure. No bleeding after the procedure. No sore throat or change in color of secretions . No fever. He is very agitated today, and really just wanted is biopsy results. We discussed that the biopsy from the right upper lobe is suspicious for non small cell malignancy, same as what he had previously in the lung and colon.. We discussed that his best option is for radiation therapy. Pt. Wants surgery. I explained based on his current state of deconditioning,and PFT's done in 2018, he was not a good candidate for surgery. He insisted that he be referred to surgery, which I have done. Dr. Cliffton Asters and Dorris Fetch can evaluate him and determine if he is a surgical candidate. I have referred him to radiation oncology for evaluation and he has follow up with Dr. Truett Perna in 2 weeks on 7/16. He will need MRI Brain if he qualifies for surgery. I will not order at this time . Once  he has been evaluated by surgery, they can order if they feel appropriate..   Test Results: Cytology 03/26/2023 A. LUNG, BRONCHUS INTERMEDIUS, BIOPSY:  Benign bronchial mucosa.  Negative for granulomas or malignancy.  See comment.    A. LUNG,  DISTAL BRONCHUS INTERMEDIAL, BRUSHING:    FINAL MICROSCOPIC DIAGNOSIS:  - No malignant cells identified  - Reactive bronchial cells    FINAL MICROSCOPIC DIAGNOSIS:  B. LUNG, RUL OPACITY, BRUSHING:  - No malignant cells identified   C. LUNG, RUL OPACITY, FINE NEEDLE ASPIRATION AND BIOPSY:  - Rare atypical cells suspicious for non-small cell carcinoma.  - See comment   D. LUNG, RUL NODULE, FINE NEEDLE ASPIRATION AND BIOPSY:  - Rare atypical cells  - See comment   E. LUNG, RUL NODULE, BRUSHING:  - No malignant cells identified   03/29/2021: CT scan of the chest: Stable right upper lobe pulmonary nodule.    CT chest 06/30/2021: Loculated effusion, stable scattered nodular changes Slightly enlarged left paratracheal lymph node.     CT chest May 2024: Right upper lobe pulmonary nodule increased in size as well as along the fissure line concerning for recurrence of malignancy and or 2 lesions.  PET 03/25/2023  The enlarging irregular perifissural right lung nodule is mildly hypermetabolic, suspicious for bronchogenic carcinoma. 2. No significant hypermetabolic activity within the progressive bandlike opacity posteriorly in the right upper lobe on recent CT, favoring post treatment changes. Continued follow-up recommended. 3. New ill-defined parenchymal opacities in the right middle lobe with associated hypermetabolic activity, likely inflammatory. Recommend attention on follow-up. 4. No evidence of distant metastatic disease. 5. Infrarenal abdominal aortic aneurysm measuring up to 4.3 cm in AP diameter, similar to previous study. Recommend follow-up every 12 months and vascular consultation.     Latest Ref  Rng & Units 03/26/2023    8:32 AM 01/31/2023    2:48 PM 12/20/2022    9:35 AM  CBC  WBC 4.0 - 10.5 K/uL 5.0  5.8    Hemoglobin 13.0 - 17.0 g/dL 16.1  09.6  04.5   Hematocrit 39.0 - 52.0 % 39.7  42.9  41.0   Platelets 150 - 400 K/uL 315  329         Latest Ref Rng &  Units 03/26/2023    8:32 AM 01/31/2023    2:48 PM 12/20/2022    9:35 AM  BMP  Glucose 70 - 99 mg/dL 409  811  914   BUN 8 - 23 mg/dL 16  19  16    Creatinine 0.61 - 1.24 mg/dL 7.82  9.56  2.13   Sodium 135 - 145 mmol/L 134  134  139   Potassium 3.5 - 5.1 mmol/L 3.6  3.8  4.1   Chloride 98 - 111 mmol/L 101  100  102   CO2 22 - 32 mmol/L 21  25    Calcium 8.9 - 10.3 mg/dL 9.0  9.2      BNP    Component Value Date/Time   BNP 256.7 (H) 08/24/2022 0137   BNP 42.6 09/13/2016 1700    ProBNP    Component Value Date/Time   PROBNP 279.0 (H) 07/14/2018 1716    PFT    Component Value Date/Time   FEV1PRE 1.98 08/13/2017 1500   FEV1POST 2.10 10/04/2016 1103   FVCPRE 3.10 08/13/2017 1500   FVCPOST 3.26 10/04/2016 1103   TLC 7.39 08/13/2017 1500   DLCOUNC 12.40 08/13/2017 1500   PREFEV1FVCRT 64 08/13/2017 1500   PSTFEV1FVCRT 64 10/04/2016 1103    NM PET Image Initial (PI) Skull Base To Thigh (F-18 FDG)  Result Date: 03/26/2023 CLINICAL DATA:  Subsequent treatment strategy for lung cancer. Increased right lung nodularity on CT. EXAM: NUCLEAR MEDICINE PET SKULL BASE TO THIGH TECHNIQUE: 9.92 mCi F-18 FDG was injected intravenously. Full-ring PET imaging was performed from the skull base to thigh after the radiotracer. CT data was obtained and used for attenuation correction and anatomic localization. Fasting blood glucose: 113 mg/dl COMPARISON:  Previous PET-CT 04/24/2022. Chest CT 02/05/2023 and 07/26/2022. FINDINGS: Mediastinal blood pool activity: SUV max 2.6 NECK: No hypermetabolic cervical lymph nodes are identified.New symmetric muscular activity bilaterally, within physiologic limits. No suspicious activity identified within the pharyngeal mucosal space. Incidental CT findings: Previous scleral banding. Bilateral carotid atherosclerosis. CHEST: Status post remote right lower lobectomy. Chronic superior right paratracheal lymph nodes measuring up to 2.2 x 1.5 cm on image 49/4 are unchanged,  with low level hypermetabolic activity (SUV max 3.6, similar to previous study). No new or enlarging mediastinal, hilar or axillary lymph nodes identified. The progressive bandlike opacity posteriorly in the right upper lobe on recent CT demonstrates low level metabolic activity (SUV max 4.5, previously 4.6). The enlarging irregular perifissural nodule is mildly hypermetabolic (SUV max 3.2, corresponding with nodule measuring 1.5 cm on image 77/604). There are additional focal areas of hypermetabolic activity more inferiorly in the right middle lobe corresponding with ill-defined parenchymal opacities which are not well seen on the recent prior chest CT and potentially inflammatory. The greatest activity corresponds with an ill-defined part solid nodule measuring 2.2 x 1.1 cm on image 81/604 (SUV max 4.8). No hypermetabolic pulmonary activity or suspicious nodularity demonstrated on the left. Incidental CT findings: Unchanged appearance of chronic right-sided fibrothorax. Atherosclerosis of the aorta, great vessels and coronary arteries.  Moderate centrilobular and paraseptal emphysema. ABDOMEN/PELVIS: There is no hypermetabolic activity within the liver, adrenal glands, spleen or pancreas. There is no hypermetabolic nodal activity in the abdomen or pelvis. Incidental CT findings: Progressive left renal cortical thinning and atrophy. Diffuse aortic and branch vessel atherosclerosis with similar infrarenal abdominal aortic aneurysm having a maximal AP diameter of 4.3 cm on image 125/4 (previously 4.2 cm). SKELETON: There is no hypermetabolic activity to suggest osseous metastatic disease. Incidental CT findings: Multilevel spondylosis. IMPRESSION: 1. The enlarging irregular perifissural right lung nodule is mildly hypermetabolic, suspicious for bronchogenic carcinoma. 2. No significant hypermetabolic activity within the progressive bandlike opacity posteriorly in the right upper lobe on recent CT, favoring post  treatment changes. Continued follow-up recommended. 3. New ill-defined parenchymal opacities in the right middle lobe with associated hypermetabolic activity, likely inflammatory. Recommend attention on follow-up. 4. No evidence of distant metastatic disease. 5. Infrarenal abdominal aortic aneurysm measuring up to 4.3 cm in AP diameter, similar to previous study. Recommend follow-up every 12 months and vascular consultation. Reference: J Am Coll Radiol 2013;10:789-794. 6. Aortic Atherosclerosis (ICD10-I70.0) and Emphysema (ICD10-J43.9). Electronically Signed   By: Carey Bullocks M.D.   On: 03/26/2023 12:19   DG Chest Port 1 View  Result Date: 03/26/2023 CLINICAL DATA:  Post bronchoscopy. EXAM: PORTABLE CHEST 1 VIEW COMPARISON:  Radiographs 12/07/2022 and 08/27/2022. CT 02/05/2023 PET-CT 03/25/2023. FINDINGS: 1126 hours. Stable volume loss in the right hemithorax from previous right lower lobe resection. Interval increased density at the right lung base attributed to post biopsy hemorrhage. Adjacent pleural thickening is unchanged. There is no pneumothorax. The left lung appears clear. The heart size and mediastinal contours are stable. Loop recorder noted in the left anterior chest wall. No acute osseous findings. IMPRESSION: No evidence of pneumothorax following bronchoscopy. Interval increased density at the right lung base attributed to post biopsy hemorrhage. Electronically Signed   By: Carey Bullocks M.D.   On: 03/26/2023 11:55   DG C-ARM BRONCHOSCOPY  Result Date: 03/26/2023 C-ARM BRONCHOSCOPY: Fluoroscopy was utilized by the requesting physician.  No radiographic interpretation.   DG C-Arm 1-60 Min-No Report  Result Date: 03/26/2023 Fluoroscopy was utilized by the requesting physician.  No radiographic interpretation.     Past medical hx Past Medical History:  Diagnosis Date   AAA (abdominal aortic aneurysm) (HCC) 10-20-17   MONITORED BY DR Edilia Bo, 3.7 CM by ABDOMINAL US 11/2021    Anxiety    Arthritis    Atrial fibrillation (HCC)    Basal cell carcinoma    BPH (benign prostatic hypertrophy)    Chronic diastolic heart failure (HCC)    Chronic kidney disease    stage 3, pt unaware   Colon cancer (HCC)    COPD (chronic obstructive pulmonary disease) (HCC)    Depression    GERD (gastroesophageal reflux disease)    Glaucoma BOTH EYES   Dr Nile Riggs   History of lung cancer APRIL 2012   ONCOLOGIST- DR Mariel Sleet  LOV IN [facility] 10-27-2012 SQUAMOUS CELL---- S/P RIGHT LOWER LOBECTOMY AT [facility] -- NO CHEMORADIATION--- NO RECURRENCE   Hx of adenomatous colonic polyps 2005    X 2; 1 hyperplastic polyp; Dr Juanda Chance   Hyperlipidemia    Hypertension    OSA (obstructive sleep apnea) 08/29/2015   CPAP SET ON 10   Peripheral vascular disease (HCC)    FOLLOWED  BY DR DICKSON   Spinal stenosis 06/2014   Thoracic aorta atherosclerosis (HCC)    Thyrotoxicosis    amiodarone induced  Social History   Tobacco Use   Smoking status: Former    Packs/day: 0.50    Years: 50.00    Additional pack years: 0.00    Total pack years: 25.00    Types: Cigarettes    Quit date: 07/28/2014    Years since quitting: 8.6    Passive exposure: Never   Smokeless tobacco: Never  Vaping Use   Vaping Use: Never used  Substance Use Topics   Alcohol use: Yes    Alcohol/week: 4.0 standard drinks of alcohol    Types: 1 Glasses of wine, 1 Cans of beer, 1 Shots of liquor, 1 Standard drinks or equivalent per week    Comment: socially, variable; moderately heavy use in the past   Drug use: No    Mr.Demorest reports that he quit smoking about 8 years ago. His smoking use included cigarettes. He has a 25.00 pack-year smoking history. He has never been exposed to tobacco smoke. He has never used smokeless tobacco. He reports current alcohol use of about 4.0 standard drinks of alcohol per week. He reports that he does not use drugs.  Tobacco Cessation: Former smoker quit 2015 with a 25++ pack year smoking  history   Past surgical hx, Family hx, Social hx all reviewed.  Current Outpatient Medications on File Prior to Visit  Medication Sig   acetaminophen (TYLENOL) 325 MG tablet Take 650 mg by mouth every 6 (six) hours as needed for moderate pain or headache.   albuterol (VENTOLIN HFA) 108 (90 Base) MCG/ACT inhaler Inhale 2 puffs into the lungs every 6 (six) hours as needed for wheezing or shortness of breath.   amLODipine (NORVASC) 10 MG tablet Take 10 mg by mouth daily.   apixaban (ELIQUIS) 2.5 MG TABS tablet Take 1 tablet (2.5 mg total) by mouth 2 (two) times daily. Okay to restart this medication on 03/27/2023   Brinzolamide-Brimonidine (SIMBRINZA) 1-0.2 % SUSP Place 1 drop into both eyes 3 (three) times daily. 8a, 2p, 8p   Budeson-Glycopyrrol-Formoterol (BREZTRI AEROSPHERE) 160-9-4.8 MCG/ACT AERO Inhale 2 puffs into the lungs in the morning and at bedtime.   docusate sodium (COLACE) 100 MG capsule Take 100 mg by mouth 2 (two) times daily as needed for moderate constipation.   escitalopram (LEXAPRO) 10 MG tablet TAKE ONE TABLET BY MOUTH EVERY DAY   furosemide (LASIX) 20 MG tablet Take 80 mg by mouth daily.   JARDIANCE 10 MG TABS tablet TAKE ONE TABLET BY MOUTH EVERY DAY BEFORE BREAKFAST.   latanoprost (XALATAN) 0.005 % ophthalmic solution Place 1 drop into both eyes at bedtime.   Melatonin 10 MG TABS Take 20 mg by mouth at bedtime.   metoprolol succinate (TOPROL-XL) 25 MG 24 hr tablet Take 25 mg by mouth daily.   montelukast (SINGULAIR) 10 MG tablet TAKE ONE TABLET BY MOUTH AT BEDTIME   omeprazole (PRILOSEC OTC) 20 MG tablet Take 20 mg by mouth daily.   Polyethyl Glycol-Propyl Glycol (SYSTANE ULTRA) 0.4-0.3 % SOLN Place 1 drop into both eyes daily as needed (dry eyes).   potassium chloride SA (KLOR-CON M) 20 MEQ tablet TAKE TWO TABLETS BY MOUTH IN THE MORNING AND TAKE ONE TABLET IN THE EVENING (Patient taking differently: Take 40 mEq by mouth daily.)   PRESCRIPTION MEDICATION Pt uses CPAP  machine at bedtime   rosuvastatin (CRESTOR) 40 MG tablet TAKE ONE TABLET BY MOUTH EVERY DAY   senna-docusate (SENOKOT-S) 8.6-50 MG tablet Take 1 tablet by mouth daily.   spironolactone (ALDACTONE) 25 MG tablet Take  25 mg by mouth daily.   No current facility-administered medications on file prior to visit.     Allergies  Allergen Reactions   Niacin-Lovastatin Er Shortness Of Breath and Other (See Comments)    Dyspnea Flushing  *Not listed on MAR    Penicillins Hives, Shortness Of Breath and Other (See Comments)    Dyspnea Flushing Because of a history of documented adverse serious drug reaction;Medi Alert bracelet  is recommended PCN reaction causing immediate rash, facial/tongue/throat swelling, SOB or lightheadedness with hypotension: Yes PCN reaction causing severe rash involving mucus membranes or skin necrosis: No PCN reaction occurring within the last 10 years: NO PCN reaction that required hospitalization: NO   Sulfonamide Derivatives Hives, Shortness Of Breath and Other (See Comments)    Dyspnea  Flushing Because of a history of documented adverse serious drug reaction;Medi Alert bracelet  is recommended   Lipitor [Atorvastatin] Other (See Comments)    Myalgias Arthralgias   *Not listed on MAR     Review Of Systems:  Constitutional:   No  weight loss, night sweats,  Fevers, chills, fatigue, or  lassitude.  HEENT:   No headaches,  Difficulty swallowing,  Tooth/dental problems, or  Sore throat,                No sneezing, itching, ear ache, nasal congestion, post nasal drip,   CV:  No chest pain,  Orthopnea, PND, swelling in lower extremities, anasarca, dizziness, palpitations, syncope.   GI  No heartburn, indigestion, abdominal pain, nausea, vomiting, diarrhea, change in bowel habits, loss of appetite, bloody stools.   Resp: No shortness of breath with exertion or at rest.  No excess mucus, no productive cough,  No non-productive cough,  No coughing up of blood.   No change in color of mucus.  No wheezing.  No chest wall deformity  Skin: no rash or lesions.  GU: no dysuria, change in color of urine, no urgency or frequency.  No flank pain, no hematuria   MS:  No joint pain or swelling.  No decreased range of motion.  No back pain.  Psych:  No change in mood or affect. No depression or anxiety.  No memory loss.   Vital Signs BP 132/70   Pulse (!) 51   Temp 98.5 F (36.9 C) (Oral)   Ht 6\' 1"  (1.854 m)   Wt 175 lb (79.4 kg)   SpO2 90%   BMI 23.09 kg/m    Physical Exam:  General- No distress,  A&Ox3, anxious and agitated ENT: No sinus tenderness, TM clear, pale nasal mucosa, no oral exudate,no post nasal drip, no LAN Cardiac: S1, S2, regular rate and rhythm, no murmur Chest: No wheeze/ rales/ dullness; no accessory muscle use, no nasal flaring, no sternal retractions, diminished per bases Abd.: Soft Non-tender, ND, BS +. Body mass index is 23.09 kg/m.  Ext: No clubbing cyanosis, edema Neuro:  Physically deconditioned , in wheelchair Skin: No rashes, warm and dry Psych: angry behavior, rude  and abrupt   Assessment/Plan Right upper lobe pulmonary nodule + for  Rare atypical cells suspicious for non-small cell carcinoma. Former smoker  Previous lung cancer / colon cancer Plan We have reviewed you biopsy results. They were suspicious  for non small cell lung cancer I have referred you radiation oncology  for treatment options, as well as surgery( at patient insistence ) and you have an appointment with Dr. Truett Perna 04/16/2023 to discuss medical oncology options .  Based on the recommendation of  these doctors, you can choose your best option for treatment. Follow up as needed. Please contact office for sooner follow up if symptoms do not improve or worsen or seek emergency care   I spent 40 minutes dedicated to the care of this patient on the date of this encounter to include pre-visit review of records, face-to-face time with the  patient discussing conditions above, post visit ordering of testing, clinical documentation with the electronic health record, making appropriate referrals as documented, and communicating necessary information to the patient's healthcare team.   Bevelyn Ngo, NP 04/02/2023  10:44 AM

## 2023-04-05 ENCOUNTER — Telehealth: Payer: Self-pay

## 2023-04-05 ENCOUNTER — Encounter: Payer: Self-pay | Admitting: Radiation Oncology

## 2023-04-05 NOTE — Telephone Encounter (Signed)
Rn called pt daughter Wilkie Aye for meaningful use and nurse evaluation information. Consult note completed and routed to Dr. Roselind Messier.

## 2023-04-05 NOTE — Progress Notes (Signed)
Thoracic Location of Tumor / Histology: right upper lobe   CT Super D Chest Wo Contrast 02/05/2023  IMPRESSION: Status post right lower lobectomy.  Chronic right pleural effusion.   Progressive band like opacity in the right upper lobe along the minor fissure, favoring recurrence.   16 mm right perifissural nodule is also progressive, suspicious for recurrence/metastasis.   Aortic Atherosclerosis (ICD10-I70.0) and Emphysema (ICD10-J43.9).   NM PET Image Initial (PI) Skull Base To Thigh 03/25/2023  IMPRESSION: 1. The enlarging irregular perifissural right lung nodule is mildly hypermetabolic, suspicious for bronchogenic carcinoma. 2. No significant hypermetabolic activity within the progressive bandlike opacity posteriorly in the right upper lobe on recent CT, favoring post treatment changes. Continued follow-up recommended. 3. New ill-defined parenchymal opacities in the right middle lobe with associated hypermetabolic activity, likely inflammatory. Recommend attention on follow-up. 4. No evidence of distant metastatic disease. 5. Infrarenal abdominal aortic aneurysm measuring up to 4.3 cm in AP diameter, similar to previous study. Recommend follow-up every 12 months and vascular consultation. Reference: J Am Coll Radiol 2013;10:789-794. 6. Aortic Atherosclerosis (ICD10-I70.0) and Emphysema (ICD10-J43.9).  Patient presented withsymptoms of: (Per Dr. Myrlene Broker note)   Is an 82 year old gentleman, past medical history of atrial fibrillation, AAA, BPH, chronic diastolic heart failure, emphysema, history of lung cancer, April 2012, squamous cell carcinoma status post right lower lobectomy at [facility], no chemo no radiation no recurrence, hyperlipidemia, hypertension. Patient has no respiratory complaints today. He is anxious about this slow-growing lesion that has been followed for some time. He has several within the right chest, none within the left chest.  Constitutional:  Positive for  weight loss.  Respiratory:  Positive for shortness of breath.    Biopsies revealed:  03-26-23 FINAL MICROSCOPIC DIAGNOSIS:  B. LUNG, RUL OPACITY, BRUSHING:  - No malignant cells identified   C. LUNG, RUL OPACITY, FINE NEEDLE ASPIRATION AND BIOPSY:  - Rare atypical cells suspicious for non-small cell carcinoma.  - See comment   D. LUNG, RUL NODULE, FINE NEEDLE ASPIRATION AND BIOPSY:  - Rare atypical cells  - See comment   E. LUNG, RUL NODULE, BRUSHING:  - No malignant cells identified    Tobacco/Marijuana/Snuff/ETOH use: Former smoker  Past/Anticipated interventions by cardiothoracic surgery, if any:  Video Bronchoscopy with Robotic Assisted Bronchoscopic Navigation    Date of Operation: 03/26/2023    Pre-op Diagnosis: Right middle lobe opacity, right upper lobe nodule   Post-op Diagnosis: Same   Surgeon: Levy Pupa  Pt to see Dr. Cliffton Asters on 04-19-23.  Past/Anticipated interventions by medical oncology, if any:  Ladene Artist, MD 10/16/2022  INTERVAL HISTORY:    Mr. Bruski returns as scheduled.  He saw Dr. Tonia Brooms in October after undergoing a repeat chest CT.  A decision was made to continue observation of the right lung nodule.  He is scheduled for a follow-up chest CT and pulmonary visit at a 6-month interval.   Mr Rasheed was admitted in November with respiratory failure due to COPD and suspected pneumonia.  He was discharged to skilled nursing facility and is now in assisted living facility.    He reports losing weight with diuresis and a diminished appetite.  He has diminished vision.  No specific complaint today.   Assessment/Plan: Adenocarcinoma of sigmoid colon-stage II (T3N0)  Colonoscopy 06/25/2018- multiple polyps in the ascending and descending colon, partially obstructing mass in the sigmoid colon-biopsy confirmed adenocarcinoma CTs 06/25/2018-enlarging right paratracheal lymph node compared to November 2018, no other evidence of metastatic disease Low  anterior resection  12/12/2018, stage II (T3N0) adenocarcinoma the sigmoid colon, no loss of mismatch repair protein expression, MSI-stable Colonoscopy 06/23/2020-polyps removed from the transverse colon, ascending colon, and cecum-tubular adenomas  CT chest 01/14/2020-stable mildly enlarged high right paratracheal node and right hilar node CT abdomen/pelvis 01/29/2020-no evidence of recurrent disease, stable right lower lobe nodule CT chest 01/24/2021-mild growth of 2 irregular basilar right upper lobe nodules, stable high right mediastinal node PET scan 02/08/2021- band of nodules in the right upper lobe including a 13 mm nodule with an SUV of 3.2 and a 13 mm nodule with an SUV of 1.4.,  12 mm nodule in the upper right mediastinum with an SUV of 3.5    2.  History of iron deficiency anemia Secondary to bleeding from tumor, exacerbated by anticoagulation with eliquis   3. 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 Ewing Schlein at [facility]. Last Ct chest 05/01/18 stable, without concerning evidence of recurrence  -CT 06/25/2018- nonspecific small bilateral nodules, enlarging superior right paratracheal node -EUS biopsy of the right paratracheal nodes (several rounded nodes measuring 4 cm in total) on 07/10/2018- negative for malignancy -CT chest 03/03/2019- stable postoperative appearance of the chest, unchanged mildly prominent right paratracheal lymph node, no evidence of metastatic disease -CT chest 01/14/2020-stable surgical changes in the right chest, stable mild mediastinal and right hilar adenopathy -CT chest 01/24/2021- mild interval growth of irregular nodular opacities in the right upper lobe along the minor  fissure -PET 02/08/2021-band of hypermetabolic nodules in the inferior aspect of the right upper lobe-indeterminate, nodularity increased over time.  Hypermetabolic nodule in the right upper mediastinum, similar small right effusion -CT super D chest 03/21/2021-consolidation and nodularity in the posterior inferior right upper lobe slightly diminished, unchanged loculated small right effusion, fine clustered centrilobular nodularity concentrated in the left upper lobe-unchanged.  No enlarged mediastinal, hilar, or axillary nodes -Right pleural fluid 07/05/2021-reactive mesothelial cells -CT super D chest 09/12/2021-decrease in size of right paratracheal and left mediastinal nodes, stable small right pleural effusion, right lower lobectomy, similar soft tissue thickening in the inferior right upper lobe, upper lobe predominant centrilobular nodularity favored to represent bronchiolitis -CT super D chest 04/09/2022-increased nodular thickening at the minor fissure, increased masslike density in the inferior right upper lobe, stable right pleural effusion -PET 04/24/2022-enlarging bandlike opacity in the inferior right upper lobe low-level hypermetabolic activity-slightly increased from the previous PET, no hypermetabolic activity in the anterior nodularity, no evidence of metastatic disease, stable chronic loculated right pleural effusion -CT super D chest 07/20/2022-unchanged appearance of tracer avid bandlike opacity in the inferior right upper lobe-indeterminant, nodular area at the anterior basal right upper lobe unchanged     4. COPD 5. Atrial fibrillation 6. Thyrotoxicosis-potentially amiodarone related 7.  Multiple gastric polyps on endoscopy 06/23/2020-1 oozing polyp-hyperplastic polyp, biopsy from the gastric antrum revealed intestinal metaplasia EGD 02/13/2022-gastric polyps-1 oozing polyp resected, nonbleeding duodenal ulcer with a visible vessel injected with epinephrine and treated with a heater  probe, pathology revealed a hyperplastic gastric polyp   Disposition: Mr Troy [name] is in clinical remission from lung cancer and colon cancer.  He has an indeterminate right lung density on repeat imaging.  He is scheduled for a follow-up chest CT and pulmonary visit in April.  He  will return for office visit in 6 months.  We will follow-up on the CEA from today.   He continues follow-up with other providers for multiple comorbid conditions.  Signs/Symptoms Weight changes, if any: has lost weight but has gained some of it back per pt daughter Respiratory complaints, if any: yes, baseline cough for years, pt does have shortness of breath  Hemoptysis, if any: no Pain issues, if any:  no  SAFETY ISSUES: Prior radiation? no Pacemaker/ICD? Yes, pt has loop recorder  Possible current pregnancy?no Is the patient on methotrexate? no  Current Complaints / other details:  Rn spoke with pt daughter Wilkie Aye for pre consult information. She has no major questions or concerns. She stated they have a plan for transport to clinic from ALF on Monday.

## 2023-04-07 NOTE — Progress Notes (Signed)
Radiation Oncology         [phone]) [phone] ________________________________  Initial Outpatient Consultation  Name: Troy Buzby [name] MRN: [id number]  Date: 04/08/2023  DOB: May 01, 1941  CC:Pincus Sanes, MD  Josephine [name], DO   REFERRING PHYSICIAN: Josephine [name], DO  DIAGNOSIS: The primary encounter diagnosis was Malignant neoplasm of right upper lobe of lung (HCC). A diagnosis of Lung nodule was also pertinent to this visit.  Opacities and nodule in the right upper lobe suspicious for non- small cell lung cancer   History of right lung cancer SCC diagnosed in April 2012, s/p right lower lobectomy (did not receive any adjuvant therapy)  History of colon cancer diagnosed in September 2019  HISTORY OF PRESENT ILLNESS::Troy Tiquan Bendel [name] is a 82 y.o. male who is accompanied by healthcare assistant.. he is seen as a courtesy of Dr. Tonia Brooms for an opinion concerning radiation therapy as part of management for his recently diagnosed probable right lung cancer. The patient has a history of right lung cancer SCC diagnosed in April 2012, s/p right lower lobectomy at [facility] He did not receive at chemo or radiation therapy at that time and remained without evidence of disease recurrence.   He has been followed by Dr. Tonia Brooms for several years now for follow-up of several nodules in the right lung / right upper lobe. Prior to his recent imaging studies, the patient had a super D chest CT performed on 07/20/22 which showed no change in  appearance of an indeterminate tracer avid bandlike opacity in the inferior right upper lobe and of a nodular area at the anterior basal right upper lobe.   He was supposed to presented for a follow-up chest CT in march 2024, however this was no completed due to a scheduling error.   Subsequently, the patient presented for a follow-up super-D chest CT on 02/05/23 which demonstrated: progression of a band like opacity in the right upper lobe along the minor  fissure favoring disease recurrence, a progressive 16 mm right perifissural nodule, suspicious for recurrence/metastasis, and a chronic right pleural effusion.   Accordingly, Dr. Tonia Brooms recommended proceeding with a PET scan which was performed on 03/25/23 and demonstrated: the enlarging irregular perifissural right lung nodule as mildly hypermetabolic, suspicious for bronchogenic carcinoma; new ill-defined parenchymal opacities in the right middle lobe with associated hypermetabolic activity (likely inflammatory in etiology); and no significant hypermetabolic activity within the progressive bandlike opacity posteriorly in the right upper lobe, favoring post treatment changes. PET otherwise showed no evidence of distant metastatic disease.   He was subsequently referred to pulmonary medicine and opted to proceed with bronchoscopy with biopsies of the RUL on 03/26/23 under the care of Dr. Delton Coombes. Biopsy of the RUL opacity showed rare atypical cells suspicious for non-small cell carcinoma. Biopsy of the RUL nodule showed rare atypical cells present. Biopsies of the bronchus intermedius and intermedial distal bronchus showed no evidence of malignancy.   In addition to radiation therapy to the RUL, Dr. Tonia Brooms has referred the patient to Dr. Cliffton Asters to discuss surgical treatment options. He is currently scheduled to meet with Dr. Truett Perna on 04/16/23 to discuss management of his pulmonary nodules.  The patient also has a history of colon cancer (adenocarcinoma of the sigmoid colon) diagnosed in September 2019. He previously followed with Dr. Truett Perna for this.    PREVIOUS RADIATION THERAPY: No  PAST MEDICAL HISTORY:  Past Medical History:  Diagnosis Date   AAA (abdominal aortic aneurysm) (HCC) 10-20-17  MONITORED BY DR Edilia Bo, 3.7 CM by ABDOMINAL US 11/2021   Anxiety    Arthritis    Atrial fibrillation (HCC)    Basal cell carcinoma    BPH (benign prostatic hypertrophy)    Chronic diastolic heart  failure (HCC)    Chronic kidney disease    stage 3, pt unaware   Colon cancer (HCC)    COPD (chronic obstructive pulmonary disease) (HCC)    Depression    GERD (gastroesophageal reflux disease)    Glaucoma BOTH EYES   Dr Nile Riggs   History of lung cancer APRIL 2012   ONCOLOGIST- DR Mariel Sleet  LOV IN [facility] 10-27-2012 SQUAMOUS CELL---- S/P RIGHT LOWER LOBECTOMY AT [facility] -- NO CHEMORADIATION--- NO RECURRENCE   Hx of adenomatous colonic polyps 2005    X 2; 1 hyperplastic polyp; Dr Juanda Chance   Hyperlipidemia    Hypertension    OSA (obstructive sleep apnea) 08/29/2015   CPAP SET ON 10   Peripheral vascular disease (HCC)    FOLLOWED  BY DR DICKSON   Spinal stenosis 06/2014   Thoracic aorta atherosclerosis (HCC)    Thyrotoxicosis    amiodarone induced    PAST SURGICAL HISTORY: Past Surgical History:  Procedure Laterality Date   ABDOMINAL AORTOGRAM W/LOWER EXTREMITY N/A 12/20/2022   Procedure: ABDOMINAL AORTOGRAM W/LOWER EXTREMITY;  Surgeon: Cephus Shelling, MD;  [facility];  Service: Cardiovascular;  Laterality: N/A;   ANGIO PLASTY     X 4 in legs   AORTOGRAM  07-27-2002   MILD DIFFUSE ILIAC ARTERY OCCLUSIVE DISEASE /  LEFT RENAL ARTERY 20%/ PATENT LEFT FEM-POP GRAFT/ MILD SFA AND POPLITEAL ARTERY OCCLUSIVE DISEASE W/ SEVERE KIDNEY OCCLUSIVE DISEASE   ATRIAL FIBRILLATION ABLATION N/A 04/09/2019   Procedure: ATRIAL FIBRILLATION ABLATION;  Surgeon: Hillis Range, MD;  [facility];  Service: Cardiovascular;  Laterality: N/A;   ATRIAL FIBRILLATION ABLATION N/A 02/12/2020   Procedure: ATRIAL FIBRILLATION ABLATION;  Surgeon: Hillis Range, MD;  [facility];  Service: Cardiovascular;  Laterality: N/A;   BALLOON DILATION N/A 02/06/2022   Procedure: BALLOON Tamponade ampulla;  Surgeon: Lemar Lofty., MD;  Location: [facility] ENDOSCOPY;  Service: Gastroenterology;  Laterality: N/A;   BASAL CELL CARCINOMA EXCISION     MULTIPLE TIMES--  RIGHT  FOREARM, CHEEKS, AND BACK   BIOPSY  06/25/2018   Procedure: BIOPSY;  Surgeon: Meridee Score Netty Starring., MD;  Location: [facility] ENDOSCOPY;  Service: Gastroenterology;;   BIOPSY  02/06/2022   Procedure: BIOPSY;  Surgeon: Lemar Lofty., MD;  Location: [facility] ENDOSCOPY;  Service: Gastroenterology;;   BRONCHIAL BIOPSY  03/26/2023   Procedure: BRONCHIAL BIOPSIES;  Surgeon: [name], MD;  Location: [facility] ENDOSCOPY;  Service: Pulmonary;;   BRONCHIAL BRUSHINGS  03/26/2023   Procedure: BRONCHIAL BRUSHINGS;  Surgeon: [name], MD;  Location: [facility] ENDOSCOPY;  Service: Pulmonary;;   BRONCHIAL NEEDLE ASPIRATION BIOPSY  03/26/2023   Procedure: BRONCHIAL NEEDLE ASPIRATION BIOPSIES;  Surgeon: [name], MD;  Location: [facility] ENDOSCOPY;  Service: Pulmonary;;   BRONCHIAL WASHINGS  03/26/2023   Procedure: BRONCHIAL WASHINGS;  Surgeon: [name], MD;  [facility] ENDOS[facility];  Service: Pulmonary;;   CARDIOVASCULAR STRESS TEST  12-08-2012  DR MCLEAN   NORMAL LEXISCAN WITH NO EXERCISE NUCLEAR STUDY/ EF 66%/   NO ISCHEMIA/ NO SIGNIFICANT CHANGE FROM PRIOR STUDY   CARDIOVERSION N/A 02/14/2018   Procedure: CARDIOVERSION;  Surgeon: Laurey Morale, MD;  Location: [facility] ENDOSCOPY;  Service: Cardiovascular;  Laterality: N/A;   CARDIOVERSION  N/A 07/10/2022   Procedure: CARDIOVERSION;  Surgeon: Laurey Morale, MD;  Location: [facility] ENDOSCOPY;  Service: Cardiovascular;  Laterality: N/A;   CAROTID ANGIOGRAM N/A 07/10/2013   Procedure: CAROTID ANGIOGRAM;  Surgeon: Sherren Kerns, MD;  Location: [facility] CATH LAB;  Service: Cardiovascular;  Laterality: N/A;   CAROTID ENDARTERECTOMY Right 07-14-13   cea   CATARACT EXTRACTION W/ INTRAOCULAR LENS  IMPLANT, BILATERAL     colon polyectomy     COLONOSCOPY     COLONOSCOPY WITH PROPOFOL N/A 06/25/2018   Procedure: COLONOSCOPY WITH PROPOFOL;  Surgeon: Meridee Score Netty Starring., MD;  Location: [facility] ENDOSCOPY;  Service: Gastroenterology;  Laterality: N/A;   CYSTOSCOPY W/ RETROGRADES  Bilateral 01/21/2013   Procedure: CYSTOSCOPY WITH RETROGRADE PYELOGRAM;  Surgeon: Milford Cage, MD;  Location: [facility];  Service: Urology;  Laterality: Bilateral;   CYSTO, BLADDER BIOPSY, BILATERAL RETROGRADE PYELOGRAM  RAD TECH FROM RADIOLOGY PER JOY   CYSTOSCOPY WITH BIOPSY N/A 01/21/2013   Procedure: CYSTOSCOPY WITH BIOPSY;  Surgeon: Milford Cage, MD;  Location: [facility];  Service: Urology;  Laterality: N/A;   ENDARTERECTOMY Right 07/14/2013   Procedure: ENDARTERECTOMY CAROTID;  Surgeon: Chuck Hint, MD;  Location: [facility] OR;  Service: Vascular;  Laterality: Right;   ENDOSCOPIC RETROGRADE CHOLANGIOPANCREATOGRAPHY (ERCP) WITH PROPOFOL N/A 02/06/2022   Procedure: ENDOSCOPIC RETROGRADE CHOLANGIOPANCREATOGRAPHY (ERCP) WITH PROPOFOL;  Surgeon: Lemar Lofty., MD;  Location: [facility] ENDOSCOPY;  Service: Gastroenterology;  Laterality: N/A;   EP IMPLANTABLE DEVICE N/A 08/12/2015   Procedure: Loop Recorder Insertion;  Surgeon: Hillis Range, MD;  [facility];  Service: Cardiovascular;  Laterality: N/A;   ESOPHAGOGASTRODUODENOSCOPY Left 02/13/2022   Procedure: ESOPHAGOGASTRODUODENOSCOPY (EGD);  Surgeon: Shellia Cleverly, DO;  Location: [facility] ENDOSCOPY;  Service: Gastroenterology;  Laterality: Left;   ESOPHAGOGASTRODUODENOSCOPY (EGD) WITH PROPOFOL N/A 06/25/2018   Procedure: ESOPHAGOGASTRODUODENOSCOPY (EGD) WITH PROPOFOL;  Surgeon: Meridee Score Netty Starring., MD;  Location: [facility] ENDOSCOPY;  Service: Gastroenterology;  Laterality: N/A;   ESOPHAGOGASTRODUODENOSCOPY (EGD) WITH PROPOFOL N/A 07/10/2018   Procedure: ESOPHAGOGASTRODUODENOSCOPY (EGD) WITH PROPOFOL;  Surgeon: Rachael Fee, MD;  Location: [facility] ENDOSCOPY;  Service: Endoscopy;  Laterality: N/A;   EUS N/A 07/10/2018   Procedure: UPPER ENDOSCOPIC ULTRASOUND (EUS) RADIAL;  Surgeon: Rachael Fee, MD;  Location: [facility] ENDOSCOPY;  Service: Endoscopy;  Laterality: N/A;   EYE SURGERY  Right    FEMORAL-POPLITEAL BYPASS GRAFT Left 1994  [facility]   AND 2001  IN [place]   FEMORAL-POPLITEAL BYPASS GRAFT Left 08/30/2015   Procedure: REVISION OF BYPASS GRAFT Left  FEMORAL-POPLITEAL ARTERY;  Surgeon: Chuck Hint, MD;  Location: [facility] OR;  Service: Vascular;  Laterality: Left;   FINE NEEDLE ASPIRATION N/A 07/10/2018   Procedure: FINE NEEDLE ASPIRATION (FNA) LINEAR;  Surgeon: Rachael Fee, MD;  Location: [facility] ENDOSCOPY;  Service: Endoscopy;  Laterality: N/A;   FLEXIBLE SIGMOIDOSCOPY N/A 12/12/2018   Procedure: FLEXIBLE SIGMOIDOSCOPY;  Surgeon: Andria Meuse, MD;  Location: [facility] ORS;  Service: General;  Laterality: N/A;   HEMOSTASIS CLIP PLACEMENT  02/13/2022   Procedure: HEMOSTASIS CLIP PLACEMENT;  Surgeon: Shellia Cleverly, DO;  Location[facility] ENDOSCOPY;  Service: Gastroenterology;;   HEMOSTASIS CONTROL  03/26/2023   Procedure: HEMOSTASIS CONTROL;  Surgeon: [name], MD;  Location: [facility] ENDOSCOPY;  Service: Pulmonary;;   HOT HEMOSTASIS N/A 02/13/2022   Procedure: HOT HEMOSTASIS (ARGON PLASMA COAGULATION/BICAP);  Surgeon: Shellia Cleverly, DO;  Location: [facility] ENDOSCOPY;  Service: Gastroenterology;  Laterality: N/A;   LARYNGOSCOPY  06-27-2004  BX VOCAL CORD  (LEUKOPLAKIA)  PER PT NO ISSUES SINCE   LEFT HEART CATH AND CORONARY ANGIOGRAPHY N/A 08/20/2017   Procedure: LEFT HEART CATH AND CORONARY ANGIOGRAPHY;  Surgeon: Laurey Morale, MD;  Location: [facility] INVASIVE CV LAB;  Service: Cardiovascular;  Laterality: N/A;   LOOP RECORDER INSERTION N/A 04/09/2019   Procedure: LOOP RECORDER INSERTION;  Surgeon: Hillis Range, MD;  [facility];  Service: Cardiovascular;  Laterality: N/A;   LOOP RECORDER REMOVAL N/A 04/09/2019   Procedure: LOOP RECORDER REMOVAL;  Surgeon: Hillis Range, MD;  Location[facility];  Service: Cardiovascular;  Laterality: N/A;   LOWER EXTREMITY ANGIOGRAM Bilateral 08/29/2015   Procedure: Lower Extremity Angiogram;  Surgeon:  Chuck Hint, MD;  Location: [facility] INVASIVE CV LAB;  Service: Cardiovascular;  Laterality: Bilateral;   LUNG LOBECTOMY  01/24/2011    RIGHT UPPER LOBE  (SQUAMOUS CELL CARCINOMA) Dr Desmond Lope , [facility]. No chemotherapyor radiation   PANCREATIC STENT PLACEMENT  02/06/2022   Procedure: PANCREATIC STENT PLACEMENT;  Surgeon: Meridee Score Netty Starring., MD;  Location: [facility] ENDOSCOPY;  Service: Gastroenterology;;   [facility] ANGIOPLASTY Right 07/14/2013   Procedure: PATCH ANGIOPLASTY;  Surgeon: Chuck Hint, MD;  Location: [facility] OR;  Service: Vascular;  Laterality: Right;   PATCH ANGIOPLASTY Left 08/30/2015   Procedure: VEIN PATCH ANGIOPLASTY OF PROXIMAL Left BYPASS GRAFT;  Surgeon: Chuck Hint, MD;  Location: [facility] OR;  Service: Vascular;  Laterality: Left;   PERIPHERAL VASCULAR CATHETERIZATION N/A 08/29/2015   Procedure: Abdominal Aortogram;  Surgeon: Chuck Hint, MD;  Location: [facility] INVASIVE CV LAB;  Service: Cardiovascular;  Laterality: N/A;   POLYPECTOMY  06/25/2018   Procedure: POLYPECTOMY;  Surgeon: Meridee Score Netty Starring., MD;  Location: [facility] ENDOSCOPY;  Service: Gastroenterology;;   POLYPECTOMY     POLYPECTOMY  02/13/2022   Procedure: POLYPECTOMY;  Surgeon: Shellia Cleverly, DO;  Location[facility] ENDOSCOPY;  Service: Gastroenterology;;   REMOVAL OF STONES  02/06/2022   Procedure: REMOVAL OF STONES;  Surgeon: Lemar Lofty., MD;  Location: [facility] ENDOSCOPY;  Service: Gastroenterology;;   Susa Day  02/13/2022   Procedure: Susa Day;  Surgeon: Shellia Cleverly, DO;  Location: [facility] ENDOSCOPY;  Service: Gastroenterology;;   Dennison Mascot  02/06/2022   Procedure: Dennison Mascot;  Surgeon: Lemar Lofty., MD;  Location: [facility] ENDOSCOPY;  Service: Gastroenterology;;   SUBMUCOSAL INJECTION  06/25/2018   Procedure: SUBMUCOSAL INJECTION;  Surgeon: Lemar Lofty., MD;  Location: [facility] ENDOSCOPY;  Service: Gastroenterology;;   TEE WITHOUT CARDIOVERSION N/A 02/14/2018    Procedure: TRANSESOPHAGEAL ECHOCARDIOGRAM (TEE);  Surgeon: Laurey Morale, MD;  Location: [facility] ENDOSCOPY;  Service: Cardiovascular;  Laterality: N/A;   trabecular surgery     OS   TRANSTHORACIC ECHOCARDIOGRAM  12-29-2012  DR [facility]   MILD LVH/  LVSF NORMAL/ EF 55-60%/  GRADE I DIASTOLIC DYSFUNCTION   VIDEO BRONCHOSCOPY WITH RADIAL ENDOBRONCHIAL ULTRASOUND  03/26/2023   Procedure: VIDEO BRONCHOSCOPY WITH RADIAL ENDOBRONCHIAL ULTRASOUND;  Surgeon: [name], MD;  Location: [facility] ENDOSCOPY;  Service: Pulmonary;;    FAMILY HISTORY:  Family History  Problem Relation Age of Onset   Stroke Mother        mini strokes   Alcohol abuse Father    Heart disease Father        MI after 50   Stroke Father    Hypertension Father    Heart attack Father    Heart disease Paternal Aunt        several   Hypertension Paternal Aunt  several   Stroke Paternal Aunt        several   Stroke Paternal Uncle        several   Heart disease Paternal Uncle        several;2 had MI pre 66   Cancer Daughter 8       breast ca, also with benign sessile polyp    Colon cancer Daughter 39   Colon polyps Neg Hx    Esophageal cancer Neg Hx    Rectal cancer Neg Hx    Stomach cancer Neg Hx     SOCIAL HISTORY:  Social History   Tobacco Use   Smoking status: Former    Packs/day: 0.50    Years: 50.00    Additional pack years: 0.00    Total pack years: 25.00    Types: Cigarettes    Quit date: 07/28/2014    Years since quitting: 8.7    Passive exposure: Never   Smokeless tobacco: Never  Vaping Use   Vaping Use: Never used  Substance Use Topics   Alcohol use: Yes    Alcohol/week: 4.0 standard drinks of alcohol    Types: 1 Glasses of wine, 1 Cans of beer, 1 Shots of liquor, 1 Standard drinks or equivalent per week    Comment: socially, variable; moderately heavy use in the past   Drug use: No    ALLERGIES:  Allergies  Allergen Reactions   Niacin-Lovastatin Er Shortness Of Breath and Other (See  Comments)    Dyspnea Flushing  *Not listed on MAR    Penicillins Hives, Shortness Of Breath and Other (See Comments)    Dyspnea Flushing Because of a history of documented adverse serious drug reaction;Medi Alert bracelet  is recommended PCN reaction causing immediate rash, facial/tongue/throat swelling, SOB or lightheadedness with hypotension: Yes PCN reaction causing severe rash involving mucus membranes or skin necrosis: No PCN reaction occurring within the last 10 years: NO PCN reaction that required hospitalization: NO   Sulfonamide Derivatives Hives, Shortness Of Breath and Other (See Comments)    Dyspnea  Flushing Because of a history of documented adverse serious drug reaction;Medi Alert bracelet  is recommended   Lipitor [Atorvastatin] Other (See Comments)    Myalgias Arthralgias   *Not listed on MAR     MEDICATIONS:  Current Outpatient Medications  Medication Sig Dispense Refill   acetaminophen (TYLENOL) 325 MG tablet Take 650 mg by mouth every 6 (six) hours as needed for moderate pain or headache.     albuterol (VENTOLIN HFA) 108 (90 Base) MCG/ACT inhaler Inhale 2 puffs into the lungs every 6 (six) hours as needed for wheezing or shortness of breath.     amLODipine (NORVASC) 10 MG tablet Take 10 mg by mouth daily.     apixaban (ELIQUIS) 2.5 MG TABS tablet Take 1 tablet (2.5 mg total) by mouth 2 (two) times daily. Okay to restart this medication on 03/27/2023 60 tablet    Brinzolamide-Brimonidine (SIMBRINZA) 1-0.2 % SUSP Place 1 drop into both eyes 3 (three) times daily. 8a, 2p, 8p     Budeson-Glycopyrrol-Formoterol (BREZTRI AEROSPHERE) 160-9-4.8 MCG/ACT AERO Inhale 2 puffs into the lungs in the morning and at bedtime. 10.7 g 0   docusate sodium (COLACE) 100 MG capsule Take 100 mg by mouth 2 (two) times daily as needed for moderate constipation.     escitalopram (LEXAPRO) 10 MG tablet TAKE ONE TABLET BY MOUTH EVERY DAY 28 tablet 5   furosemide (LASIX) 20 MG tablet Take 80  mg  by mouth daily.     JARDIANCE 10 MG TABS tablet TAKE ONE TABLET BY MOUTH EVERY DAY BEFORE BREAKFAST. 30 tablet 11   latanoprost (XALATAN) 0.005 % ophthalmic solution Place 1 drop into both eyes at bedtime.     Melatonin 10 MG TABS Take 20 mg by mouth at bedtime.     metoprolol succinate (TOPROL-XL) 25 MG 24 hr tablet Take 25 mg by mouth daily.     montelukast (SINGULAIR) 10 MG tablet TAKE ONE TABLET BY MOUTH AT BEDTIME 28 tablet 5   omeprazole (PRILOSEC OTC) 20 MG tablet Take 20 mg by mouth daily.     Polyethyl Glycol-Propyl Glycol (SYSTANE ULTRA) 0.4-0.3 % SOLN Place 1 drop into both eyes daily as needed (dry eyes).     potassium chloride SA (KLOR-CON M) 20 MEQ tablet TAKE TWO TABLETS BY MOUTH IN THE MORNING AND TAKE ONE TABLET IN THE EVENING (Patient taking differently: Take 40 mEq by mouth daily.) 90 tablet 6   PRESCRIPTION MEDICATION Pt uses CPAP machine at bedtime     rosuvastatin (CRESTOR) 40 MG tablet TAKE ONE TABLET BY MOUTH EVERY DAY 90 tablet 3   senna-docusate (SENOKOT-S) 8.6-50 MG tablet Take 1 tablet by mouth daily.     spironolactone (ALDACTONE) 25 MG tablet Take 25 mg by mouth daily.     No current facility-administered medications for this encounter.    REVIEW OF SYSTEMS:  A 10+ POINT REVIEW OF SYSTEMS WAS OBTAINED including neurology, dermatology, psychiatry, cardiac, respiratory, lymph, extremities, GI, GU, musculoskeletal, constitutional, reproductive, HEENT.  He reports poor vision related to glaucoma.  He denies any significant cough or hemoptysis.  He denies any pain along the right chest area.   PHYSICAL EXAM:  height is 6\' 1"  (1.854 m) and weight is 173 lb 12.8 oz (78.8 kg). His temperature is 97.8 F (36.6 C). His blood pressure is 148/58 (abnormal) and his pulse is 56 (abnormal). His respiration is 24 (abnormal) and oxygen saturation is 96%.   General: Alert and oriented, in no acute distress, ambulates with the assistance of a walker,  most often is in a  wheelchair. HEENT: Head is normocephalic. Extraocular movements are intact.  Neck: Neck is supple, no palpable cervical or supraclavicular lymphadenopathy. Heart: Regular in rate and rhythm with no murmurs, rubs, or gallops. Chest: Clear to auscultation bilaterally, with no rhonchi, wheezes, or rales. Abdomen: Soft, nontender, nondistended, with no rigidity or guarding. Extremities: No cyanosis or edema. Lymphatics: see Neck Exam Skin: No concerning lesions. Musculoskeletal: symmetric strength and muscle tone throughout. Neurologic: Cranial nerves II through XII are grossly intact. No obvious focalities. Speech is fluent. Coordination is intact. Psychiatric: Judgment and insight are intact. Affect is appropriate.   ECOG = 1  0 - Asymptomatic (Fully active, able to carry on all predisease activities without restriction)  1 - Symptomatic but completely ambulatory (Restricted in physically strenuous activity but ambulatory and able to carry out work of a light or sedentary nature. For example, light housework, office work)  2 - Symptomatic, <50% in bed during the day (Ambulatory and capable of all self care but unable to carry out any work activities. Up and about more than 50% of waking hours)  3 - Symptomatic, >50% in bed, but not bedbound (Capable of only limited self-care, confined to bed or chair 50% or more of waking hours)  4 - Bedbound (Completely disabled. Cannot carry on any self-care. Totally confined to bed or chair)  5 - Death   Santiago Glad MM,  Creech RH, Tormey DC, et al. [phone]). "Toxicity and response criteria of the [facility]". Am. Evlyn Clines. Oncol. 5 (6): 649-55  LABORATORY DATA:  Lab Results  Component Value Date   WBC 5.0 03/26/2023   HGB 12.8 (L) 03/26/2023   HCT 39.7 03/26/2023   MCV 90.2 03/26/2023   PLT 315 03/26/2023   NEUTROABS 5.7 08/26/2022   Lab Results  Component Value Date   NA 134 (L) 03/26/2023   K 3.6 03/26/2023   CL 101  03/26/2023   CO2 21 (L) 03/26/2023   GLUCOSE 138 (H) 03/26/2023   BUN 16 03/26/2023   CREATININE 1.36 (H) 03/26/2023   CALCIUM 9.0 03/26/2023      RADIOGRAPHY: NM PET Image Initial (PI) Skull Base To Thigh (F-18 FDG)  Result Date: 03/26/2023 CLINICAL DATA:  Subsequent treatment strategy for lung cancer. Increased right lung nodularity on CT. EXAM: NUCLEAR MEDICINE PET SKULL BASE TO THIGH TECHNIQUE: 9.92 mCi F-18 FDG was injected intravenously. Full-ring PET imaging was performed from the skull base to thigh after the radiotracer. CT data was obtained and used for attenuation correction and anatomic localization. Fasting blood glucose: 113 mg/dl COMPARISON:  Previous PET-CT 04/24/2022. Chest CT 02/05/2023 and 07/26/2022. FINDINGS: Mediastinal blood pool activity: SUV max 2.6 NECK: No hypermetabolic cervical lymph nodes are identified.New symmetric muscular activity bilaterally, within physiologic limits. No suspicious activity identified within the pharyngeal mucosal space. Incidental CT findings: Previous scleral banding. Bilateral carotid atherosclerosis. CHEST: Status post remote right lower lobectomy. Chronic superior right paratracheal lymph nodes measuring up to 2.2 x 1.5 cm on image 49/4 are unchanged, with low level hypermetabolic activity (SUV max 3.6, similar to previous study). No new or enlarging mediastinal, hilar or axillary lymph nodes identified. The progressive bandlike opacity posteriorly in the right upper lobe on recent CT demonstrates low level metabolic activity (SUV max 4.5, previously 4.6). The enlarging irregular perifissural nodule is mildly hypermetabolic (SUV max 3.2, corresponding with nodule measuring 1.5 cm on image 77/604). There are additional focal areas of hypermetabolic activity more inferiorly in the right middle lobe corresponding with ill-defined parenchymal opacities which are not well seen on the recent prior chest CT and potentially inflammatory. The greatest  activity corresponds with an ill-defined part solid nodule measuring 2.2 x 1.1 cm on image 81/604 (SUV max 4.8). No hypermetabolic pulmonary activity or suspicious nodularity demonstrated on the left. Incidental CT findings: Unchanged appearance of chronic right-sided fibrothorax. Atherosclerosis of the aorta, great vessels and coronary arteries. Moderate centrilobular and paraseptal emphysema. ABDOMEN/PELVIS: There is no hypermetabolic activity within the liver, adrenal glands, spleen or pancreas. There is no hypermetabolic nodal activity in the abdomen or pelvis. Incidental CT findings: Progressive left renal cortical thinning and atrophy. Diffuse aortic and branch vessel atherosclerosis with similar infrarenal abdominal aortic aneurysm having a maximal AP diameter of 4.3 cm on image 125/4 (previously 4.2 cm). SKELETON: There is no hypermetabolic activity to suggest osseous metastatic disease. Incidental CT findings: Multilevel spondylosis. IMPRESSION: 1. The enlarging irregular perifissural right lung nodule is mildly hypermetabolic, suspicious for bronchogenic carcinoma. 2. No significant hypermetabolic activity within the progressive bandlike opacity posteriorly in the right upper lobe on recent CT, favoring post treatment changes. Continued follow-up recommended. 3. New ill-defined parenchymal opacities in the right middle lobe with associated hypermetabolic activity, likely inflammatory. Recommend attention on follow-up. 4. No evidence of distant metastatic disease. 5. Infrarenal abdominal aortic aneurysm measuring up to 4.3 cm in AP diameter, similar to previous study. Recommend follow-up every 12  months and vascular consultation. Reference: J Am Coll Radiol 2013;10:789-794. 6. Aortic Atherosclerosis (ICD10-I70.0) and Emphysema (ICD10-J43.9). Electronically Signed   By: Carey Bullocks M.D.   On: 03/26/2023 12:19   DG Chest Port 1 View  Result Date: 03/26/2023 CLINICAL DATA:  Post bronchoscopy. EXAM:  PORTABLE CHEST 1 VIEW COMPARISON:  Radiographs 12/07/2022 and 08/27/2022. CT 02/05/2023 PET-CT 03/25/2023. FINDINGS: 1126 hours. Stable volume loss in the right hemithorax from previous right lower lobe resection. Interval increased density at the right lung base attributed to post biopsy hemorrhage. Adjacent pleural thickening is unchanged. There is no pneumothorax. The left lung appears clear. The heart size and mediastinal contours are stable. Loop recorder noted in the left anterior chest wall. No acute osseous findings. IMPRESSION: No evidence of pneumothorax following bronchoscopy. Interval increased density at the right lung base attributed to post biopsy hemorrhage. Electronically Signed   By: Carey Bullocks M.D.   On: 03/26/2023 11:55   DG C-ARM BRONCHOSCOPY  Result Date: 03/26/2023 C-ARM BRONCHOSCOPY: Fluoroscopy was utilized by the requesting physician.  No radiographic interpretation.   DG C-Arm 1-60 Min-No Report  Result Date: 03/26/2023 Fluoroscopy was utilized by the requesting physician.  No radiographic interpretation.      IMPRESSION: Opacities and nodule in the right upper lobe suspicious for non- small cell lung cancer   I reviewed the patient's imaging studies in detail.  Given his poor eyesight related to glaucoma he was unable to view imaging studies.  We discussed that pathology shows findings suspicious but not diagnostic of lung cancer in the 2 areas biopsied.  We discussed options for management including watchful waiting given his extensive medical issues.  We also discussed consideration for SBRT or ultra hypofractionated radiation therapy for management of these 2 suspicious lesions in the right lung.  We also discussed that he may be a potential surgical candidate however given his multiple medical issues this would be doubtful.  He is considering canceling his consultation with Dr. Cliffton Asters in light of this issue.  PLAN: Treatment plan is pending at this time.  The  patient will meet with Dr. Truett Perna who has been following him some time for his history of colon cancer.  The patient would like to have input from Dr. Truett Perna concerning management of these pulmonary nodules before making his final decision concerning treatment.   60 minutes of total time was spent for this patient encounter, including preparation, face-to-face counseling with the patient and coordination of care, physical exam, and documentation of the encounter.   ------------------------------------------------  Billie Lade, PhD, MD  This document serves as a record of services personally performed by Antony Blackbird, MD. It was created on his behalf by Neena Rhymes, a trained medical scribe. The creation of this record is based on the scribe's personal observations and the provider's statements to them. This document has been checked and approved by the attending provider.

## 2023-04-08 ENCOUNTER — Ambulatory Visit
Admission: RE | Admit: 2023-04-08 | Discharge: 2023-04-08 | Disposition: A | Discharge status: Home / Self Care | Payer: PPO | Source: Ambulatory Visit | Attending: Radiation Oncology | Admitting: Radiation Oncology

## 2023-04-08 ENCOUNTER — Encounter: Payer: Self-pay | Admitting: Radiation Oncology

## 2023-04-08 VITALS — BP 148/58 | HR 56 | Temp 97.8°F | Resp 24 | Ht 73.0 in | Wt 173.8 lb

## 2023-04-08 DIAGNOSIS — Z800 Family history of malignant neoplasm of digestive organs: Secondary | ICD-10-CM | POA: Insufficient documentation

## 2023-04-08 DIAGNOSIS — I739 Peripheral vascular disease, unspecified: Secondary | ICD-10-CM | POA: Diagnosis not present

## 2023-04-08 DIAGNOSIS — N400 Enlarged prostate without lower urinary tract symptoms: Secondary | ICD-10-CM | POA: Diagnosis not present

## 2023-04-08 DIAGNOSIS — I714 Abdominal aortic aneurysm, without rupture, unspecified: Secondary | ICD-10-CM | POA: Insufficient documentation

## 2023-04-08 DIAGNOSIS — I700 Atherosclerosis of aorta: Secondary | ICD-10-CM | POA: Insufficient documentation

## 2023-04-08 DIAGNOSIS — Z79899 Other long term (current) drug therapy: Secondary | ICD-10-CM | POA: Insufficient documentation

## 2023-04-08 DIAGNOSIS — R911 Solitary pulmonary nodule: Secondary | ICD-10-CM

## 2023-04-08 DIAGNOSIS — C3411 Malignant neoplasm of upper lobe, right bronchus or lung: Secondary | ICD-10-CM | POA: Diagnosis not present

## 2023-04-08 DIAGNOSIS — I130 Hypertensive heart and chronic kidney disease with heart failure and stage 1 through stage 4 chronic kidney disease, or unspecified chronic kidney disease: Secondary | ICD-10-CM | POA: Diagnosis not present

## 2023-04-08 DIAGNOSIS — G4733 Obstructive sleep apnea (adult) (pediatric): Secondary | ICD-10-CM | POA: Insufficient documentation

## 2023-04-08 DIAGNOSIS — E785 Hyperlipidemia, unspecified: Secondary | ICD-10-CM | POA: Diagnosis not present

## 2023-04-08 DIAGNOSIS — Z85048 Personal history of other malignant neoplasm of rectum, rectosigmoid junction, and anus: Secondary | ICD-10-CM | POA: Insufficient documentation

## 2023-04-08 DIAGNOSIS — I7143 Infrarenal abdominal aortic aneurysm, without rupture: Secondary | ICD-10-CM | POA: Insufficient documentation

## 2023-04-08 DIAGNOSIS — Z87891 Personal history of nicotine dependence: Secondary | ICD-10-CM | POA: Diagnosis not present

## 2023-04-08 DIAGNOSIS — I4891 Unspecified atrial fibrillation: Secondary | ICD-10-CM | POA: Insufficient documentation

## 2023-04-08 DIAGNOSIS — Z86010 Personal history of colonic polyps: Secondary | ICD-10-CM | POA: Insufficient documentation

## 2023-04-08 DIAGNOSIS — K219 Gastro-esophageal reflux disease without esophagitis: Secondary | ICD-10-CM | POA: Diagnosis not present

## 2023-04-08 DIAGNOSIS — I5032 Chronic diastolic (congestive) heart failure: Secondary | ICD-10-CM | POA: Diagnosis not present

## 2023-04-08 DIAGNOSIS — Z85820 Personal history of malignant melanoma of skin: Secondary | ICD-10-CM | POA: Insufficient documentation

## 2023-04-08 DIAGNOSIS — J432 Centrilobular emphysema: Secondary | ICD-10-CM | POA: Diagnosis not present

## 2023-04-08 DIAGNOSIS — I6523 Occlusion and stenosis of bilateral carotid arteries: Secondary | ICD-10-CM | POA: Diagnosis not present

## 2023-04-08 DIAGNOSIS — C3431 Malignant neoplasm of lower lobe, right bronchus or lung: Secondary | ICD-10-CM

## 2023-04-08 NOTE — Progress Notes (Signed)
Notice of potential device damage sent to device clinic, Per Lanora Manis RN loop recorder no longer working as of 09/20/22.

## 2023-04-11 ENCOUNTER — Other Ambulatory Visit: Payer: Self-pay

## 2023-04-12 LAB — CYTOLOGY - NON PAP

## 2023-04-12 NOTE — Progress Notes (Signed)
The proposed treatment discussed in conference is for discussion purpose only and is not a binding recommendation.  The patients have not been physically examined, or presented with their treatment options.  Therefore, final treatment plans cannot be decided.  

## 2023-04-15 ENCOUNTER — Ambulatory Visit: Payer: PPO

## 2023-04-16 ENCOUNTER — Inpatient Hospital Stay: Payer: PPO | Attending: Oncology

## 2023-04-16 ENCOUNTER — Inpatient Hospital Stay: Payer: PPO | Admitting: Oncology

## 2023-04-16 VITALS — BP 150/61 | HR 60 | Temp 98.2°F | Resp 18 | Ht 73.0 in | Wt 173.2 lb

## 2023-04-16 DIAGNOSIS — C187 Malignant neoplasm of sigmoid colon: Secondary | ICD-10-CM | POA: Insufficient documentation

## 2023-04-16 DIAGNOSIS — C3411 Malignant neoplasm of upper lobe, right bronchus or lung: Secondary | ICD-10-CM | POA: Insufficient documentation

## 2023-04-16 DIAGNOSIS — Z85118 Personal history of other malignant neoplasm of bronchus and lung: Secondary | ICD-10-CM | POA: Diagnosis not present

## 2023-04-16 DIAGNOSIS — J449 Chronic obstructive pulmonary disease, unspecified: Secondary | ICD-10-CM | POA: Insufficient documentation

## 2023-04-16 DIAGNOSIS — I739 Peripheral vascular disease, unspecified: Secondary | ICD-10-CM | POA: Diagnosis not present

## 2023-04-16 DIAGNOSIS — I4891 Unspecified atrial fibrillation: Secondary | ICD-10-CM | POA: Diagnosis not present

## 2023-04-16 LAB — CEA (ACCESS): CEA (CHCC): 2.09 ng/mL (ref 0.00–5.00)

## 2023-04-16 NOTE — Progress Notes (Signed)
[facility] OFFICE PROGRESS NOTE   Diagnosis: Colon cancer non-small cell lung cancer  INTERVAL HISTORY:   Troy [name] returns for a scheduled visit.  He has exertional dyspnea.  Good appetite.  No pain.  A CT super D chest on 02/05/2023 revealed a progressive bandlike opacity in the right upper lobe at the minor fissure and a progressive 16 mm right perifissural nodule.  A PET scan 03/25/2023 found the perifissural right lung nodule be mildly hypermetabolic.  No hyperbolic activity associated with the bandlike opacity in the posterior right upper lobe.  New ill-defined parenchymal opacities in the right middle lobe are favored to be inflammatory.  No evidence of metastatic disease.  He was taken to a bronchoscopy Dr. Delton Coombes on 03/26/2023 biopsies were obtained from the right upper lobe opacity and right pulmonary nodule.  Objective:  Vital signs in last 24 hours:  Blood pressure (!) 150/61, pulse 60, temperature 98.2 F (36.8 C), temperature source Oral, resp. rate 18, height 6\' 1"  (1.854 m), weight 173 lb 3.2 oz (78.6 kg), SpO2 98%.    HEENT: Neck without mass Lymphatics: No cervical, supraclavicular, axillary, or inguinal nodes Resp: Decreased breath sounds in the right lower posterior chest, no respiratory distress Cardio: Regular rate and rhythm GI: No hepatosplenomegaly Vascular: No leg edema   Lab Results:  Lab Results  Component Value Date   WBC 5.0 03/26/2023   HGB 12.8 (L) 03/26/2023   HCT 39.7 03/26/2023   MCV 90.2 03/26/2023   PLT 315 03/26/2023   NEUTROABS 5.7 08/26/2022    CMP  Lab Results  Component Value Date   NA 134 (L) 03/26/2023   K 3.6 03/26/2023   CL 101 03/26/2023   CO2 21 (L) 03/26/2023   GLUCOSE 138 (H) 03/26/2023   BUN 16 03/26/2023   CREATININE 1.36 (H) 03/26/2023   CALCIUM 9.0 03/26/2023   PROT 6.2 (L) 08/26/2022   ALBUMIN 3.1 (L) 08/26/2022   AST 23 08/26/2022   ALT 18 08/26/2022   ALKPHOS 54 08/26/2022   BILITOT 0.7  08/26/2022   GFRNONAA 52 (L) 03/26/2023   GFRAA 74 05/16/2020    Lab Results  Component Value Date   CEA1 1.19 06/19/2021   CEA 2.09 04/16/2023   Medications: I have reviewed the patient's current medications.   Assessment/Plan:  Adenocarcinoma of sigmoid colon-stage II (T3N0)  Colonoscopy 06/25/2018- multiple polyps in the ascending and descending colon, partially obstructing mass in the sigmoid colon-biopsy confirmed adenocarcinoma CTs 06/25/2018-enlarging right paratracheal lymph node compared to November 2018, no other evidence of metastatic disease Low anterior resection 12/12/2018, stage II (T3N0) adenocarcinoma the sigmoid colon, no loss of mismatch repair protein expression, MSI-stable Colonoscopy 06/23/2020-polyps removed from the transverse colon, ascending colon, and cecum-tubular adenomas  CT chest 01/14/2020-stable mildly enlarged high right paratracheal node and right hilar node CT abdomen/pelvis 01/29/2020-no evidence of recurrent disease, stable right lower lobe nodule CT chest 01/24/2021-mild growth of 2 irregular basilar right upper lobe nodules, stable high right mediastinal node PET scan 02/08/2021- band of nodules in the right upper lobe including a 13 mm nodule with an SUV of 3.2 and a 13 mm nodule with an SUV of 1.4.,  12 mm nodule in the upper right mediastinum with an SUV of 3.5      2.  History of iron deficiency anemia Secondary to bleeding from tumor, exacerbated by anticoagulation with eliquis  3. 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 Ewing Schlein at [facility]. Last Ct chest 05/01/18 stable, without concerning evidence of recurrence  -CT 06/25/2018- nonspecific small  bilateral nodules, enlarging superior right paratracheal node -EUS biopsy of the right paratracheal nodes (several rounded nodes measuring 4 cm in total) on 07/10/2018- negative for malignancy -CT chest 03/03/2019- stable postoperative appearance of the chest, unchanged mildly prominent right paratracheal lymph node, no evidence of metastatic disease -CT chest 01/14/2020-stable surgical changes in the right chest, stable mild mediastinal and right hilar adenopathy -CT chest 01/24/2021- mild interval growth of irregular nodular opacities in the right upper lobe along the minor fissure -PET 02/08/2021-band of hypermetabolic nodules in the inferior aspect of the right upper lobe-indeterminate, nodularity increased over time.  Hypermetabolic nodule in the right upper mediastinum, similar small right effusion -CT super D chest 03/21/2021-consolidation and nodularity in the posterior inferior right upper lobe slightly diminished, unchanged loculated small right effusion, fine clustered centrilobular nodularity concentrated in the left upper lobe-unchanged.  No enlarged mediastinal, hilar, or axillary nodes -Right pleural fluid 07/05/2021-reactive mesothelial cells -CT super D chest 09/12/2021-decrease in size of right paratracheal and left mediastinal nodes, stable small right pleural effusion, right lower lobectomy, similar soft tissue thickening in the inferior right upper lobe, upper lobe predominant centrilobular nodularity favored to represent bronchiolitis -CT super D chest 04/09/2022-increased nodular thickening at the minor fissure, increased masslike density in the inferior right upper lobe, stable right pleural effusion -PET 04/24/2022-enlarging bandlike opacity in the inferior right upper lobe low-level hypermetabolic activity-slightly increased from the previous PET, no hypermetabolic activity in the anterior nodularity, no evidence of metastatic disease, stable chronic loculated right pleural effusion -CT  super D chest 07/20/2022-unchanged appearance of tracer avid bandlike opacity in the inferior right upper lobe-indeterminant, nodular area at the anterior basal right upper lobe unchanged -CT super D chest 02/05/2023-chronic right pleural effusion, progressive bandlike opacity in the right upper lobe along the minor fissure, progressive 16 mm right perifissural nodule -PET 03/25/2023-enlarging perifissural right lung nodule mildly hypermetabolic, no hypermetabolic activity within the progressive bandlike opacity in the posterior right upper lobe, new ill-defined parenchymal opacities in the right middle lobe-likely inflammatory, no evidence of distant metastatic disease -Bronchoscopy biopsy of right upper lobe nodule and right lung bandlike thickening-adenocarcinoma and squamous cell carcinoma     4. COPD 5. Atrial fibrillation 6. Thyrotoxicosis-potentially amiodarone related 7.  Multiple gastric polyps on endoscopy 06/23/2020-1 oozing polyp-hyperplastic polyp, biopsy from the gastric antrum revealed intestinal metaplasia EGD 02/13/2022-gastric polyps-1 oozing polyp resected, nonbleeding duodenal ulcer with a visible vessel injected with epinephrine and treated with a heater probe, pathology revealed a hyperplastic gastric polyp 8.  Peripheral vascular disease     Disposition: Troy Kalman has a remote history of non-small cell lung cancer and colon cancer.  He has a history of slowly enlarging right lung densities, now confirmed to represent squamous cell carcinoma and adenocarcinoma.  I discussed treatment options with Troy [name] and his daughter.  He does not appear to be a surgical candidate.  They are in agreement.  We discussed other treatment options including stereotactic radiation, systemic therapy, and observation.  He is concerned about the potential toxicities associated with radiation given his already reduced lung capacity.  I explained chemotherapy would not be curative and may be difficult for  him to tolerate.  We  will submit tissue for molecular testing to see whether he would be a candidate for a targeted therapy or immunotherapy.  He would like to meet again with Dr. Roselind Messier discuss stereotactic radiation.  I will follow-up on results of molecular testing.  We will most likely follow him with observation if he decides against radiation and he is not a candidate for targeted therapy.  He will return for an office visit in 3 months.  I discussed case with Dr. Roselind Messier.  He will see Troy [name] for further discussion.  Thornton Papas, MD  04/16/2023  3:58 PM

## 2023-04-18 ENCOUNTER — Telehealth: Payer: Self-pay | Admitting: Radiation Oncology

## 2023-04-18 NOTE — Telephone Encounter (Signed)
7/18 @ 8:57 am Called both patient contact numbers, left voicemail for patient to call office to be schedule for consult with Dr. Roselind Messier.

## 2023-04-19 ENCOUNTER — Encounter: Payer: PPO | Admitting: Thoracic Surgery (Cardiothoracic Vascular Surgery)

## 2023-04-22 ENCOUNTER — Encounter: Payer: Self-pay | Admitting: Radiation Oncology

## 2023-04-22 ENCOUNTER — Telehealth: Payer: Self-pay

## 2023-04-22 NOTE — Progress Notes (Signed)
Thoracic Location of Tumor / Histology: right upper lobe opacity and right pulmonary nodule.   02/05/2023  IMPRESSION: Status post right lower lobectomy.  Chronic right pleural effusion.   Progressive band like opacity in the right upper lobe along the minor fissure, favoring recurrence.   16 mm right perifissural nodule is also progressive, suspicious for recurrence/metastasis.  03/25/2023 NM PET Image Initial (PI) Skull Base To Thigh  IMPRESSION: 1. The enlarging irregular perifissural right lung nodule is mildly hypermetabolic, suspicious for bronchogenic carcinoma. 2. No significant hypermetabolic activity within the progressive bandlike opacity posteriorly in the right upper lobe on recent CT, favoring post treatment changes. Continued follow-up recommended. 3. New ill-defined parenchymal opacities in the right middle lobe with associated hypermetabolic activity, likely inflammatory. Recommend attention on follow-up. 4. No evidence of distant metastatic disease. 5. Infrarenal abdominal aortic aneurysm measuring up to 4.3 cm in AP diameter, similar to previous study. Recommend follow-up every 12 months and vascular consultation. Reference: J Am Coll Radiol 2013;10:789-794. 6. Aortic Atherosclerosis (ICD10-I70.0) and Emphysema (ICD10-J43.9).  Patient presented with symptoms of: (Per Dr. Kavin Leech note on 03-07-23) Patient has no respiratory complaints today. He is anxious about this slow-growing lesion that has been followed for some time. He has several within the right chest, none within the left chest.   Biopsies revealed:  03-26-23 FINAL MICROSCOPIC DIAGNOSIS: B. LUNG, RUL OPACITY, BRUSHING: - No malignant cells identified  C. LUNG, RUL OPACITY, FINE NEEDLE ASPIRATION AND BIOPSY: AMENDED DIAGNOSIS: - Squamous cell carcinoma - See amendment comment  ORIGINAL DIAGNOSIS: - Rare atypical cells suspicious for non-small cell carcinoma. - See comment  D. LUNG, RUL NODULE,  FINE NEEDLE ASPIRATION AND BIOPSY: AMENDED DIAGNOSIS: - Adenocarcinoma - See amendment comment  ORIGINAL DIAGOSIS: - Rare atypical cells - See comment  E. LUNG, RUL NODULE, BRUSHING: - No malignant cells identified  AMENDMENT COMMENT: This case is reviewed for thoracic conference and the cellblocks for parts C and D both show non-small cell carcinoma and the case is amended to reflect the findings in the cellblock which were likely not available at the time of original sign-out.  C.  Review of the cellblock for the right upper lobe opacity shows non-small cell carcinoma which is positive with p40 and cytokeratin 5/6 and negative with TTF-1 and Napsin A.  The morphology and immunophenotype are consistent with squamous cell carcinoma. D.  Review of the cellblock for the right upper lobe nodule shows adenocarcinoma which is positive with TTF-1 and Napsin A and negative with p40 and cytokeratin 5/6 supporting the diagnosis.  Dr. Venetia Night agrees.  This case was discussed with Dr. Delton Coombes on 04/11/2023.  ORIGINAL COMMENT: C.  D.  Dr. Venetia Night agrees.  Dr. Delton Coombes was notified on 03/27/2023.  SPECIMEN ADEQUACY: B. Satisfactory for Evaluation C. Satisfactory for Evaluation D. Satisfactory for Evaluation E.  Satisfactory for evaluation, scant cellularity   Tobacco/Marijuana/Snuff/ETOH use: former smoker  Past/Anticipated interventions by cardiothoracic surgery, if any:  Video Bronchoscopy with Robotic Assisted Bronchoscopic Navigation    Date of Operation: 03/26/2023    Pre-op Diagnosis: Right middle lobe opacity, right upper lobe nodule   Post-op Diagnosis: Same   Surgeon: Levy Pupa  Past/Anticipated interventions by medical oncology, if any:  Ladene Artist, MD 04/16/2023  Diagnosis: Colon cancer non-small cell lung cancer  INTERVAL HISTORY:    Troy [name] returns for a scheduled visit.  He has exertional dyspnea.  Good appetite.  No pain.  A CT super D chest on  02/05/2023 revealed a progressive  bandlike opacity in the right upper lobe at the minor fissure and a progressive 16 mm right perifissural nodule.  A PET scan 03/25/2023 found the perifissural right lung nodule be mildly hypermetabolic.  No hyperbolic activity associated with the bandlike opacity in the posterior right upper lobe.  New ill-defined parenchymal opacities in the right middle lobe are favored to be inflammatory.  No evidence of metastatic disease.   He was taken to a bronchoscopy Dr. Delton Coombes on 03/26/2023 biopsies were obtained from the right upper lobe opacity and right pulmonary nodule.  Assessment/Plan:   Adenocarcinoma of sigmoid colon-stage II (T3N0)  Colonoscopy 06/25/2018- multiple polyps in the ascending and descending colon, partially obstructing mass in the sigmoid colon-biopsy confirmed adenocarcinoma CTs 06/25/2018-enlarging right paratracheal lymph node compared to November 2018, no other evidence of metastatic disease Low anterior resection 12/12/2018, stage II (T3N0) adenocarcinoma the sigmoid colon, no loss of mismatch repair protein expression, MSI-stable Colonoscopy 06/23/2020-polyps removed from the transverse colon, ascending colon, and cecum-tubular adenomas  CT chest 01/14/2020-stable mildly enlarged high right paratracheal node and right hilar node CT abdomen/pelvis 01/29/2020-no evidence of recurrent disease, stable right lower lobe nodule CT chest 01/24/2021-mild growth of 2 irregular basilar right upper lobe nodules, stable high right mediastinal node PET scan 02/08/2021- band of nodules in the right upper lobe including a 13 mm nodule with an SUV of 3.2 and a 13 mm nodule with an SUV of 1.4.,  12 mm nodule in the upper right mediastinum with an SUV of 3.5       2.  History of iron deficiency anemia Secondary to bleeding from tumor, exacerbated by anticoagulation with eliquis  3. Stage Ib squamous cell carcinoma of right lower lung s/p bronchoscopy, mediastinoscopy, right  thoracoscopic lower lobectomy 01/24/2011 -Path poorly differentiated squamous cell carcinoma with positive vascular invasion LN stations 4R, 4L, 7, 8, 9, 10, 11, and 12 w/o evidence of malignancy  - pT2a,pN0,pMX stage IB  -managed and monitored per Dr. Ewing Schlein at [facility]. Last Ct chest 05/01/18 stable, without concerning evidence of recurrence  -CT 06/25/2018- nonspecific small bilateral nodules, enlarging superior right paratracheal node -EUS biopsy of the right paratracheal nodes (several rounded nodes measuring 4 cm in total) on 07/10/2018- negative for malignancy -CT chest 03/03/2019- stable postoperative appearance of the chest, unchanged mildly prominent right paratracheal lymph node, no evidence of metastatic disease -CT chest 01/14/2020-stable surgical changes in the right chest, stable mild mediastinal and right hilar adenopathy -CT chest 01/24/2021- mild interval growth of irregular nodular opacities in the right upper lobe along the minor fissure -PET 02/08/2021-band of hypermetabolic nodules in the inferior aspect of the right upper lobe-indeterminate, nodularity increased over time.  Hypermetabolic nodule in the right upper mediastinum, similar small right effusion -CT super D chest 03/21/2021-consolidation and nodularity in the posterior inferior right upper lobe slightly diminished, unchanged loculated small right effusion, fine clustered centrilobular nodularity concentrated in the left upper lobe-unchanged.  No enlarged mediastinal, hilar, or axillary nodes -Right pleural fluid 07/05/2021-reactive mesothelial cells -CT super D chest 09/12/2021-decrease in size of right paratracheal and left mediastinal nodes, stable small right pleural effusion, right lower lobectomy, similar soft tissue thickening in the inferior right upper lobe, upper lobe predominant centrilobular nodularity favored to represent bronchiolitis -CT super D chest 04/09/2022-increased nodular thickening at the minor fissure, increased  masslike density in the inferior right upper lobe, stable right pleural effusion -PET 04/24/2022-enlarging bandlike opacity in the inferior right upper lobe low-level hypermetabolic activity-slightly increased from the previous PET, no hypermetabolic  activity in the anterior nodularity, no evidence of metastatic disease, stable chronic loculated right pleural effusion -CT super D chest 07/20/2022-unchanged appearance of tracer avid bandlike opacity in the inferior right upper lobe-indeterminant, nodular area at the anterior basal right upper lobe unchanged -CT super D chest 02/05/2023-chronic right pleural effusion, progressive bandlike opacity in the right upper lobe along the minor fissure, progressive 16 mm right perifissural nodule -PET 03/25/2023-enlarging perifissural right lung nodule mildly hypermetabolic, no hypermetabolic activity within the progressive bandlike opacity in the posterior right upper lobe, new ill-defined parenchymal opacities in the right middle lobe-likely inflammatory, no evidence of distant metastatic disease -Bronchoscopy biopsy of right upper lobe nodule and right lung bandlike thickening-adenocarcinoma and squamous cell carcinoma  4. COPD 5. Atrial fibrillation 6. Thyrotoxicosis-potentially amiodarone related 7.  Multiple gastric polyps on endoscopy 06/23/2020-1 oozing polyp-hyperplastic polyp, biopsy from the gastric antrum revealed intestinal metaplasia EGD 02/13/2022-gastric polyps-1 oozing polyp resected, nonbleeding duodenal ulcer with a visible vessel injected with epinephrine and treated with a heater probe, pathology revealed a hyperplastic gastric polyp 8.  Peripheral vascular disease  Disposition: Troy [name] has a remote history of non-small cell lung cancer and colon cancer.  He has a history of slowly enlarging right lung densities, now confirmed to represent squamous cell carcinoma and adenocarcinoma.  I discussed treatment options with Troy.[name] and his daughter.   He does not appear to be a surgical candidate.  They are in agreement.  We discussed other treatment options including stereotactic radiation, systemic therapy, and observation.  He is concerned about the potential toxicities associated with radiation given his already reduced lung capacity.  I explained chemotherapy would not be curative and may be difficult for him to tolerate.  We will submit tissue for molecular testing to see whether he would be a candidate for a targeted therapy or immunotherapy.   He would like to meet again with Dr. Roselind Messier discuss stereotactic radiation.  I will follow-up on results of molecular testing.  We will most likely follow him with observation if he decides against radiation and he is not a candidate for targeted therapy.  He will return for an office visit in 3 months.   I discussed case with Dr. Roselind Messier.  He will see Troy. [name] for further discussion.   Signs/Symptoms Weight changes, if any: yes, daughter notices he is losing weight, eating fairly well, does like like food at [place] Respiratory complaints, if any: pt clears throat often, baseline shortness of breath (has had for years)  Hemoptysis, if any: no Pain issues, if any:  some upper back pain  SAFETY ISSUES: Prior radiation? Unsure for now Pacemaker/ICD? yes , still active for afib Possible current pregnancy?no Is the patient on methotrexate? no  Current Complaints / other details: The daughter wants her dad to understand fully the risk, benefits, and side effects. Daughter wants to make sure radiation is needed at this time, can it monitored longer. Pt is fully blind in one eye and legally blind in the other. Overall daughter is worried because he so many other health issues.

## 2023-04-22 NOTE — Progress Notes (Signed)
Radiation Oncology         [phone] ________________________________  Follow-up New Visit - Visit was held over telephone due to vision impairment    Outpatient   Name: Troy Kimmel [name] MRN: [id number]  Date: 04/23/2023  DOB: 1940-12-29  CC:Pincus Sanes, MD  Ladene Artist, MD   REFERRING PHYSICIAN: Ladene Artist, MD  DIAGNOSIS: The encounter diagnosis was Squamous cell carcinoma of right lung [facility]).  The primary encounter diagnosis was Malignant neoplasm of right upper lobe of lung (HCC). A diagnosis of Lung nodule was also pertinent to this visit.   Opacities and nodule in the right upper lobe suspicious on biopsy although not diagnostic for non- small cell lung cancer    History of right lung cancer SCC diagnosed in April 2012, s/p right lower lobectomy (did not receive any adjuvant therapy)   History of colon cancer diagnosed in September 2019  Narrative / Interval History :: I connected with  Troy Bruce [name] on 04/23/23 by telephone and verified that I am speaking with the correct person using two identifiers.   I discussed the limitations of evaluation and management by telemedicine. The patient expressed understanding and agreed to proceed.  Troy Nadir Vasques [name] is a 82 y.o. male who contacted today to further discuss the role of radiation therapy as a part of management for his recently diagnosed RUL nodule suspicious for lung cancer. He was last seen here for his initial consultation visit on 04/08/23.   To review from his initial visit, we discussed options for management including watchful waiting given his extensive medical issues. We also discussed consideration for SBRT or ultra hypofractionated radiation therapy for management of these 2 suspicious lesions in the right lung.  We also discussed that he may be a potential surgical candidate however given his multiple medical issues, this would be doubtful. The patient also requested having input  from Dr. Truett Perna concerning management of these pulmonary nodules before making his final decision concerning treatment.     Since his last visit, the patient accordingly met with Dr. Truett Perna on 04/16/23. At which time, the patient was noted to express concern about the potential toxicities associated with radiation given his already reduced lung capacity. Dr. Truett Perna explained that chemotherapy would not be curative and may be difficult for him to tolerate. In light of this, Dr. Truett Perna will submit tissue for molecular testing to see whether he would be a candidate for a targeted therapy or immunotherapy. Dr. Truett Perna will follow-up with the patient to report results from molecular testing. If the patient decides against radiation therapy and is not a candidate for a targeted therapy, Dr. Truett Perna will likely have him followed under observation.  Of note: the patient's case was presented at the tumor board held on 04/11/23.   HPI Initial Consultation Visit - 04/08/23::Troy Durant Scibilia [name] is a 82 y.o. male who is accompanied by healthcare assistant.. he is seen as a courtesy of Dr. Tonia Brooms for an opinion concerning radiation therapy as part of management for his recently diagnosed probable right lung cancer. The patient has a history of right lung cancer SCC diagnosed in April 2012, s/p right lower lobectomy at [facility]. He did not receive at chemo or radiation therapy at that time and remained without evidence of disease recurrence.    He has been followed by Dr. Tonia Brooms for several years now for follow-up of several nodules in the right lung / right upper lobe. Prior to his recent  imaging studies, the patient had a super D chest CT performed on 07/20/22 which showed no change in  appearance of an indeterminate tracer avid bandlike opacity in the inferior right upper lobe and of a nodular area at the anterior basal right upper lobe.    He was supposed to presented for a follow-up chest CT in march 2024,  however this was no completed due to a scheduling error.    Subsequently, the patient presented for a follow-up super-D chest CT on 02/05/23 which demonstrated: progression of a band like opacity in the right upper lobe along the minor fissure favoring disease recurrence, a progressive 16 mm right perifissural nodule, suspicious for recurrence/metastasis, and a chronic right pleural effusion.    Accordingly, Dr. Tonia Brooms recommended proceeding with a PET scan which was performed on 03/25/23 and demonstrated: the enlarging irregular perifissural right lung nodule as mildly hypermetabolic, suspicious for bronchogenic carcinoma; new ill-defined parenchymal opacities in the right middle lobe with associated hypermetabolic activity (likely inflammatory in etiology); and no significant hypermetabolic activity within the progressive bandlike opacity posteriorly in the right upper lobe, favoring post treatment changes. PET otherwise showed no evidence of distant metastatic disease.    He was subsequently referred to pulmonary medicine and opted to proceed with bronchoscopy with biopsies of the RUL on 03/26/23 under the care of Dr. Delton Coombes. Biopsy of the RUL opacity showed rare atypical cells suspicious for non-small cell carcinoma. Biopsy of the RUL nodule showed rare atypical cells present. Biopsies of the bronchus intermedius and intermedial distal bronchus showed no evidence of malignancy.    In addition to radiation therapy to the RUL, Dr. Tonia Brooms has referred the patient to Dr. Cliffton Asters to discuss surgical treatment options. He is currently scheduled to meet with Dr. Truett Perna on 04/16/23 to discuss management of his pulmonary nodules.   The patient also has a history of colon cancer (adenocarcinoma of the sigmoid colon) diagnosed in September 2019. He previously followed with Dr. Truett Perna for this.     PREVIOUS RADIATION THERAPY: No  PAST MEDICAL HISTORY:  Past Medical History:  Diagnosis Date   AAA  (abdominal aortic aneurysm) (HCC) 10-20-17   MONITORED BY DR Edilia Bo, 3.7 CM by ABDOMINAL US 11/2021   Anxiety    Arthritis    Atrial fibrillation (HCC)    Basal cell carcinoma    BPH (benign prostatic hypertrophy)    Chronic diastolic heart failure (HCC)    Chronic kidney disease    stage 3, pt unaware   Colon cancer (HCC)    COPD (chronic obstructive pulmonary disease) (HCC)    Depression    GERD (gastroesophageal reflux disease)    Glaucoma BOTH EYES   Dr Nile Riggs   History of lung cancer APRIL 2012   ONCOLOGIST- DR Mariel Sleet  LOV IN [facility] 10-27-2012 SQUAMOUS CELL---- S/P RIGHT LOWER LOBECTOMY AT [facility] -- NO CHEMORADIATION--- NO RECURRENCE   Hx of adenomatous colonic polyps 2005    X 2; 1 hyperplastic polyp; Dr Juanda Chance   Hyperlipidemia    Hypertension    OSA (obstructive sleep apnea) 08/29/2015   CPAP SET ON 10   Peripheral vascular disease (HCC)    FOLLOWED  BY DR DICKSON   Spinal stenosis 06/2014   Thoracic aorta atherosclerosis (HCC)    Thyrotoxicosis    amiodarone induced    PAST SURGICAL HISTORY: Past Surgical History:  Procedure Laterality Date   ABDOMINAL AORTOGRAM W/LOWER EXTREMITY N/A 12/20/2022   Procedure: ABDOMINAL AORTOGRAM W/LOWER EXTREMITY;  Surgeon: Cephus Shelling, MD;  [facility];  Service: Cardiovascular;  Laterality: N/A;   ANGIO PLASTY     X 4 in legs   AORTOGRAM  07-27-2002   MILD DIFFUSE ILIAC ARTERY OCCLUSIVE DISEASE /  LEFT RENAL ARTERY 20%/ PATENT LEFT FEM-POP GRAFT/ MILD SFA AND POPLITEAL ARTERY OCCLUSIVE DISEASE W/ SEVERE KIDNEY OCCLUSIVE DISEASE   ATRIAL FIBRILLATION ABLATION N/A 04/09/2019   Procedure: ATRIAL FIBRILLATION ABLATION;  Surgeon: Hillis Range, MD;  [facility];  Service: Cardiovascular;  Laterality: N/A;   ATRIAL FIBRILLATION ABLATION N/A 02/12/2020   Procedure: ATRIAL FIBRILLATION ABLATION;  Surgeon: Hillis Range, MD;  [facility];  Service: Cardiovascular;  Laterality: N/A;    BALLOON DILATION N/A 02/06/2022   Procedure: BALLOON Tamponade ampulla;  Surgeon: Lemar Lofty., MD;  Location: [facility] ENDOSCOPY;  Service: Gastroenterology;  Laterality: N/A;   BASAL CELL CARCINOMA EXCISION     MULTIPLE TIMES--  RIGHT FOREARM, CHEEKS, AND BACK   BIOPSY  06/25/2018   Procedure: BIOPSY;  Surgeon: Meridee Score Netty Starring., MD;  Location: [facility] ENDOSCOPY;  Service: Gastroenterology;;   BIOPSY  02/06/2022   Procedure: BIOPSY;  Surgeon: Lemar Lofty., MD;  Location: [facility] ENDOSCOPY;  Service: Gastroenterology;;   BRONCHIAL BIOPSY  03/26/2023   Procedure: BRONCHIAL BIOPSIES;  Surgeon: [name], MD;  Location: [facility] ENDOSCOPY;  Service: Pulmonary;;   BRONCHIAL BRUSHINGS  03/26/2023   Procedure: BRONCHIAL BRUSHINGS;  Surgeon: [name], MD;  Location: [facility] ENDOSCOPY;  Service: Pulmonary;;   BRONCHIAL NEEDLE ASPIRATION BIOPSY  03/26/2023   Procedure: BRONCHIAL NEEDLE ASPIRATION BIOPSIES;  Surgeon: [name], MD;  Location: [facility] ENDOSCOPY;  Service: Pulmonary;;   BRONCHIAL WASHINGS  03/26/2023   Procedure: BRONCHIAL WASHINGS;  Surgeon: [name], MD;  Location[facility] ENDOS[facility];  Service: Pulmonary;;   CARDIOVASCULAR STRESS TEST  12-08-2012  DR MCLEAN   NORMAL LEXISCAN WITH NO EXERCISE NUCLEAR STUDY/ EF 66%/   NO ISCHEMIA/ NO SIGNIFICANT CHANGE FROM PRIOR STUDY   CARDIOVERSION N/A 02/14/2018   Procedure: CARDIOVERSION;  Surgeon: Laurey Morale, MD;  Location: [facility] ENDOSCOPY;  Service: Cardiovascular;  Laterality: N/A;   CARDIOVERSION N/A 07/10/2022   Procedure: CARDIOVERSION;  Surgeon: Laurey Morale, MD;  Location: [facility] ENDOSCOPY;  Service: Cardiovascular;  Laterality: N/A;   CAROTID ANGIOGRAM N/A 07/10/2013   Procedure: CAROTID ANGIOGRAM;  Surgeon: Sherren Kerns, MD;  Location: [facility] CATH LAB;  Service: Cardiovascular;  Laterality: N/A;   CAROTID ENDARTERECTOMY Right 07-14-13   cea   CATARACT EXTRACTION W/ INTRAOCULAR LENS  IMPLANT, BILATERAL     colon polyectomy      COLONOSCOPY     COLONOSCOPY WITH PROPOFOL N/A 06/25/2018   Procedure: COLONOSCOPY WITH PROPOFOL;  Surgeon: Meridee Score Netty Starring., MD;  Location: [facility] ENDOSCOPY;  Service: Gastroenterology;  Laterality: N/A;   CYSTOSCOPY W/ RETROGRADES Bilateral 01/21/2013   Procedure: CYSTOSCOPY WITH RETROGRADE PYELOGRAM;  Surgeon: Milford Cage, MD;  Location: [facility];  Service: Urology;  Laterality: Bilateral;   CYSTO, BLADDER BIOPSY, BILATERAL RETROGRADE PYELOGRAM  RAD TECH FROM RADIOLOGY PER JOY   CYSTOSCOPY WITH BIOPSY N/A 01/21/2013   Procedure: CYSTOSCOPY WITH BIOPSY;  Surgeon: Milford Cage, MD;  Location: [facility];  Service: Urology;  Laterality: N/A;   ENDARTERECTOMY Right 07/14/2013   Procedure: ENDARTERECTOMY CAROTID;  Surgeon: Chuck Hint, MD;  Location: [facility] OR;  Service: Vascular;  Laterality: Right;   ENDOSCOPIC RETROGRADE CHOLANGIOPANCREATOGRAPHY (ERCP) WITH PROPOFOL N/A 02/06/2022   Procedure: ENDOSCOPIC RETROGRADE CHOLANGIOPANCREATOGRAPHY (  ERCP) WITH PROPOFOL;  Surgeon: Mansouraty, Netty Starring., MD;  Location: [facility] ENDOSCOPY;  Service: Gastroenterology;  Laterality: N/A;   EP IMPLANTABLE DEVICE N/A 08/12/2015   Procedure: Loop Recorder Insertion;  Surgeon: Hillis Range, MD;  [facility];  Service: Cardiovascular;  Laterality: N/A;   ESOPHAGOGASTRODUODENOSCOPY Left 02/13/2022   Procedure: ESOPHAGOGASTRODUODENOSCOPY (EGD);  Surgeon: Shellia Cleverly, DO;  Location: [facility] ENDOSCOPY;  Service: Gastroenterology;  Laterality: Left;   ESOPHAGOGASTRODUODENOSCOPY (EGD) WITH PROPOFOL N/A 06/25/2018   Procedure: ESOPHAGOGASTRODUODENOSCOPY (EGD) WITH PROPOFOL;  Surgeon: Meridee Score Netty Starring., MD;  Location: [facility] ENDOSCOPY;  Service: Gastroenterology;  Laterality: N/A;   ESOPHAGOGASTRODUODENOSCOPY (EGD) WITH PROPOFOL N/A 07/10/2018   Procedure: ESOPHAGOGASTRODUODENOSCOPY (EGD) WITH PROPOFOL;  Surgeon: Rachael Fee, MD;  Location: [facility]  ENDOSCOPY;  Service: Endoscopy;  Laterality: N/A;   EUS N/A 07/10/2018   Procedure: UPPER ENDOSCOPIC ULTRASOUND (EUS) RADIAL;  Surgeon: Rachael Fee, MD;  Location: [facility] ENDOSCOPY;  Service: Endoscopy;  Laterality: N/A;   EYE SURGERY Right    FEMORAL-POPLITEAL BYPASS GRAFT Left 1994  [facility]   AND 2001  IN [place]   FEMORAL-POPLITEAL BYPASS GRAFT Left 08/30/2015   Procedure: REVISION OF BYPASS GRAFT Left  FEMORAL-POPLITEAL ARTERY;  Surgeon: Chuck Hint, MD;  Location: [facility] OR;  Service: Vascular;  Laterality: Left;   FINE NEEDLE ASPIRATION N/A 07/10/2018   Procedure: FINE NEEDLE ASPIRATION (FNA) LINEAR;  Surgeon: Rachael Fee, MD;  Location: [facility] ENDOSCOPY;  Service: Endoscopy;  Laterality: N/A;   FLEXIBLE SIGMOIDOSCOPY N/A 12/12/2018   Procedure: FLEXIBLE SIGMOIDOSCOPY;  Surgeon: Andria Meuse, MD;  Location: [facility] ORS;  Service: General;  Laterality: N/A;   HEMOSTASIS CLIP PLACEMENT  02/13/2022   Procedure: HEMOSTASIS CLIP PLACEMENT;  Surgeon: Shellia Cleverly, DO;  Location: [facility] ENDOSCOPY;  Service: Gastroenterology;;   HEMOSTASIS CONTROL  03/26/2023   Procedure: HEMOSTASIS CONTROL;  Surgeon: [name], MD;  Location: [facility] ENDOSCOPY;  Service: Pulmonary;;   HOT HEMOSTASIS N/A 02/13/2022   Procedure: HOT HEMOSTASIS (ARGON PLASMA COAGULATION/BICAP);  Surgeon: Shellia Cleverly, DO;  Location: [facility] ENDOSCOPY;  Service: Gastroenterology;  Laterality: N/A;   LARYNGOSCOPY  06-27-2004   BX VOCAL CORD  (LEUKOPLAKIA)  PER PT NO ISSUES SINCE   LEFT HEART CATH AND CORONARY ANGIOGRAPHY N/A 08/20/2017   Procedure: LEFT HEART CATH AND CORONARY ANGIOGRAPHY;  Surgeon: Laurey Morale, MD;  Location: [facility] INVASIVE CV LAB;  Service: Cardiovascular;  Laterality: N/A;   LOOP RECORDER INSERTION N/A 04/09/2019   Procedure: LOOP RECORDER INSERTION;  Surgeon: Hillis Range, MD;  [facility];  Service: Cardiovascular;  Laterality: N/A;   LOOP RECORDER REMOVAL N/A 04/09/2019    Procedure: LOOP RECORDER REMOVAL;  Surgeon: Hillis Range, MD;  Location[facility];  Service: Cardiovascular;  Laterality: N/A;   LOWER EXTREMITY ANGIOGRAM Bilateral 08/29/2015   Procedure: Lower Extremity Angiogram;  Surgeon: Chuck Hint, MD;  Location: [facility] INVASIVE CV LAB;  Service: Cardiovascular;  Laterality: Bilateral;   LUNG LOBECTOMY  01/24/2011    RIGHT UPPER LOBE  (SQUAMOUS CELL CARCINOMA) Dr Desmond Lope , [facility]. No chemotherapyor radiation   PANCREATIC STENT PLACEMENT  02/06/2022   Procedure: PANCREATIC STENT PLACEMENT;  Surgeon: Meridee Score Netty Starring., MD;  Location: [facility] ENDOSCOPY;  Service: Gastroenterology;;   [facility] ANGIOPLASTY Right 07/14/2013   Procedure: PATCH ANGIOPLASTY;  Surgeon: Chuck Hint, MD;  Location: [facility] OR;  Service: Vascular;  Laterality: Right;   PATCH ANGIOPLASTY Left 08/30/2015   Procedure: VEIN PATCH ANGIOPLASTY OF PROXIMAL Left  BYPASS GRAFT;  Surgeon: Chuck Hint, MD;  Location: [facility] OR;  Service: Vascular;  Laterality: Left;   PERIPHERAL VASCULAR CATHETERIZATION N/A 08/29/2015   Procedure: Abdominal Aortogram;  Surgeon: Chuck Hint, MD;  Location: [facility] INVASIVE CV LAB;  Service: Cardiovascular;  Laterality: N/A;   POLYPECTOMY  06/25/2018   Procedure: POLYPECTOMY;  Surgeon: Meridee Score Netty Starring., MD;  Location: [facility] ENDOSCOPY;  Service: Gastroenterology;;   POLYPECTOMY     POLYPECTOMY  02/13/2022   Procedure: POLYPECTOMY;  Surgeon: Shellia Cleverly, DO;  Location: [facility] ENDOSCOPY;  Service: Gastroenterology;;   REMOVAL OF STONES  02/06/2022   Procedure: REMOVAL OF STONES;  Surgeon: Lemar Lofty., MD;  Location: [facility] ENDOSCOPY;  Service: Gastroenterology;;   Susa Day  02/13/2022   Procedure: Susa Day;  Surgeon: Shellia Cleverly, DO;  Location: [facility] ENDOSCOPY;  Service: Gastroenterology;;   Dennison Mascot  02/06/2022   Procedure: Dennison Mascot;  Surgeon: Lemar Lofty., MD;  Location: [facility] ENDOSCOPY;  Service:  Gastroenterology;;   SUBMUCOSAL INJECTION  06/25/2018   Procedure: SUBMUCOSAL INJECTION;  Surgeon: Lemar Lofty., MD;  Location: [facility] ENDOSCOPY;  Service: Gastroenterology;;   TEE WITHOUT CARDIOVERSION N/A 02/14/2018   Procedure: TRANSESOPHAGEAL ECHOCARDIOGRAM (TEE);  Surgeon: Laurey Morale, MD;  Location: [facility] ENDOSCOPY;  Service: Cardiovascular;  Laterality: N/A;   trabecular surgery     OS   TRANSTHORACIC ECHOCARDIOGRAM  12-29-2012  DR [facility]   MILD LVH/  LVSF NORMAL/ EF 55-60%/  GRADE I DIASTOLIC DYSFUNCTION   VIDEO BRONCHOSCOPY WITH RADIAL ENDOBRONCHIAL ULTRASOUND  03/26/2023   Procedure: VIDEO BRONCHOSCOPY WITH RADIAL ENDOBRONCHIAL ULTRASOUND;  Surgeon: [name], MD;  Location: [facility] ENDOSCOPY;  Service: Pulmonary;;    FAMILY HISTORY:  Family History  Problem Relation Age of Onset   Stroke Mother        mini strokes   Alcohol abuse Father    Heart disease Father        MI after 61   Stroke Father    Hypertension Father    Heart attack Father    Heart disease Paternal Aunt        several   Hypertension Paternal Aunt        several   Stroke Paternal Aunt        several   Stroke Paternal Uncle        several   Heart disease Paternal Uncle        several;2 had MI pre 53   Cancer Daughter 41       breast ca, also with benign sessile polyp    Colon cancer Daughter 41   Colon polyps Neg Hx    Esophageal cancer Neg Hx    Rectal cancer Neg Hx    Stomach cancer Neg Hx     SOCIAL HISTORY:  Social History   Tobacco Use   Smoking status: Former    Current packs/day: 0.00    Average packs/day: 0.5 packs/day for 50.0 years (25.0 ttl pk-yrs)    Types: Cigarettes    Start date: 07/28/1964    Quit date: 07/28/2014    Years since quitting: 8.7    Passive exposure: Never   Smokeless tobacco: Never  Vaping Use   Vaping status: Never Used  Substance Use Topics   Alcohol use: Yes    Alcohol/week: 4.0 standard drinks of alcohol    Types: 1 Glasses of wine, 1 Cans  of beer, 1 Shots of liquor, 1 Standard drinks or equivalent per week    Comment: socially,  variable; moderately heavy use in the past   Drug use: No    ALLERGIES:  Allergies  Allergen Reactions   Niacin-Lovastatin Er Shortness Of Breath and Other (See Comments)    Dyspnea Flushing  *Not listed on MAR    Penicillins Hives, Shortness Of Breath and Other (See Comments)    Dyspnea Flushing Because of a history of documented adverse serious drug reaction;Medi Alert bracelet  is recommended PCN reaction causing immediate rash, facial/tongue/throat swelling, SOB or lightheadedness with hypotension: Yes PCN reaction causing severe rash involving mucus membranes or skin necrosis: No PCN reaction occurring within the last 10 years: NO PCN reaction that required hospitalization: NO   Sulfonamide Derivatives Hives, Shortness Of Breath and Other (See Comments)    Dyspnea  Flushing Because of a history of documented adverse serious drug reaction;Medi Alert bracelet  is recommended   Lipitor [Atorvastatin] Other (See Comments)    Myalgias Arthralgias   *Not listed on MAR     MEDICATIONS:  Current Outpatient Medications  Medication Sig Dispense Refill   acetaminophen (TYLENOL) 325 MG tablet Take 650 mg by mouth every 6 (six) hours as needed for moderate pain or headache.     albuterol (VENTOLIN HFA) 108 (90 Base) MCG/ACT inhaler Inhale 2 puffs into the lungs every 6 (six) hours as needed for wheezing or shortness of breath.     amLODipine (NORVASC) 10 MG tablet Take 10 mg by mouth daily.     apixaban (ELIQUIS) 2.5 MG TABS tablet Take 1 tablet (2.5 mg total) by mouth 2 (two) times daily. Okay to restart this medication on 03/27/2023 60 tablet    Brinzolamide-Brimonidine (SIMBRINZA) 1-0.2 % SUSP Place 1 drop into both eyes 3 (three) times daily. 8a, 2p, 8p     Budeson-Glycopyrrol-Formoterol (BREZTRI AEROSPHERE) 160-9-4.8 MCG/ACT AERO Inhale 2 puffs into the lungs in the morning and at bedtime.  10.7 g 0   docusate sodium (COLACE) 100 MG capsule Take 100 mg by mouth 2 (two) times daily as needed for moderate constipation.     escitalopram (LEXAPRO) 10 MG tablet TAKE ONE TABLET BY MOUTH EVERY DAY 28 tablet 5   furosemide (LASIX) 20 MG tablet Take 80 mg by mouth daily.     JARDIANCE 10 MG TABS tablet TAKE ONE TABLET BY MOUTH EVERY DAY BEFORE BREAKFAST. 30 tablet 11   latanoprost (XALATAN) 0.005 % ophthalmic solution Place 1 drop into both eyes at bedtime.     Melatonin 10 MG TABS Take 20 mg by mouth at bedtime.     metoprolol succinate (TOPROL-XL) 25 MG 24 hr tablet Take 25 mg by mouth daily.     montelukast (SINGULAIR) 10 MG tablet TAKE ONE TABLET BY MOUTH AT BEDTIME 28 tablet 5   omeprazole (PRILOSEC OTC) 20 MG tablet Take 20 mg by mouth daily.     Polyethyl Glycol-Propyl Glycol (SYSTANE ULTRA) 0.4-0.3 % SOLN Place 1 drop into both eyes daily as needed (dry eyes).     potassium chloride SA (KLOR-CON M) 20 MEQ tablet TAKE TWO TABLETS BY MOUTH IN THE MORNING AND TAKE ONE TABLET IN THE EVENING (Patient taking differently: Take 40 mEq by mouth daily.) 90 tablet 6   PRESCRIPTION MEDICATION Pt uses CPAP machine at bedtime     rosuvastatin (CRESTOR) 40 MG tablet TAKE ONE TABLET BY MOUTH EVERY DAY 90 tablet 3   senna-docusate (SENOKOT-S) 8.6-50 MG tablet Take 1 tablet by mouth daily.     spironolactone (ALDACTONE) 25 MG tablet Take 25 mg by mouth  daily.     No current facility-administered medications for this encounter.    REVIEW OF SYSTEMS:  A 10+ POINT REVIEW OF SYSTEMS WAS OBTAINED including neurology, dermatology, psychiatry, cardiac, respiratory, lymph, extremities, GI, GU, musculoskeletal, constitutional, reproductive, HEENT.  He reports no new symptoms since his last evaluation in the clinic   PHYSICAL EXAM: Not performed today encounter is by phone contact at the patient's request   LABORATORY DATA:  Lab Results  Component Value Date   WBC 5.0 03/26/2023   HGB 12.8 (L)  03/26/2023   HCT 39.7 03/26/2023   MCV 90.2 03/26/2023   PLT 315 03/26/2023   NEUTROABS 5.7 08/26/2022   Lab Results  Component Value Date   NA 134 (L) 03/26/2023   K 3.6 03/26/2023   CL 101 03/26/2023   CO2 21 (L) 03/26/2023   GLUCOSE 138 (H) 03/26/2023   BUN 16 03/26/2023   CREATININE 1.36 (H) 03/26/2023   CALCIUM 9.0 03/26/2023      RADIOGRAPHY: NM PET Image Initial (PI) Skull Base To Thigh (F-18 FDG)  Result Date: 03/26/2023 CLINICAL DATA:  Subsequent treatment strategy for lung cancer. Increased right lung nodularity on CT. EXAM: NUCLEAR MEDICINE PET SKULL BASE TO THIGH TECHNIQUE: 9.92 mCi F-18 FDG was injected intravenously. Full-ring PET imaging was performed from the skull base to thigh after the radiotracer. CT data was obtained and used for attenuation correction and anatomic localization. Fasting blood glucose: 113 mg/dl COMPARISON:  Previous PET-CT 04/24/2022. Chest CT 02/05/2023 and 07/26/2022. FINDINGS: Mediastinal blood pool activity: SUV max 2.6 NECK: No hypermetabolic cervical lymph nodes are identified.New symmetric muscular activity bilaterally, within physiologic limits. No suspicious activity identified within the pharyngeal mucosal space. Incidental CT findings: Previous scleral banding. Bilateral carotid atherosclerosis. CHEST: Status post remote right lower lobectomy. Chronic superior right paratracheal lymph nodes measuring up to 2.2 x 1.5 cm on image 49/4 are unchanged, with low level hypermetabolic activity (SUV max 3.6, similar to previous study). No new or enlarging mediastinal, hilar or axillary lymph nodes identified. The progressive bandlike opacity posteriorly in the right upper lobe on recent CT demonstrates low level metabolic activity (SUV max 4.5, previously 4.6). The enlarging irregular perifissural nodule is mildly hypermetabolic (SUV max 3.2, corresponding with nodule measuring 1.5 cm on image 77/604). There are additional focal areas of hypermetabolic  activity more inferiorly in the right middle lobe corresponding with ill-defined parenchymal opacities which are not well seen on the recent prior chest CT and potentially inflammatory. The greatest activity corresponds with an ill-defined part solid nodule measuring 2.2 x 1.1 cm on image 81/604 (SUV max 4.8). No hypermetabolic pulmonary activity or suspicious nodularity demonstrated on the left. Incidental CT findings: Unchanged appearance of chronic right-sided fibrothorax. Atherosclerosis of the aorta, great vessels and coronary arteries. Moderate centrilobular and paraseptal emphysema. ABDOMEN/PELVIS: There is no hypermetabolic activity within the liver, adrenal glands, spleen or pancreas. There is no hypermetabolic nodal activity in the abdomen or pelvis. Incidental CT findings: Progressive left renal cortical thinning and atrophy. Diffuse aortic and branch vessel atherosclerosis with similar infrarenal abdominal aortic aneurysm having a maximal AP diameter of 4.3 cm on image 125/4 (previously 4.2 cm). SKELETON: There is no hypermetabolic activity to suggest osseous metastatic disease. Incidental CT findings: Multilevel spondylosis. IMPRESSION: 1. The enlarging irregular perifissural right lung nodule is mildly hypermetabolic, suspicious for bronchogenic carcinoma. 2. No significant hypermetabolic activity within the progressive bandlike opacity posteriorly in the right upper lobe on recent CT, favoring post treatment changes. Continued follow-up recommended. 3.  New ill-defined parenchymal opacities in the right middle lobe with associated hypermetabolic activity, likely inflammatory. Recommend attention on follow-up. 4. No evidence of distant metastatic disease. 5. Infrarenal abdominal aortic aneurysm measuring up to 4.3 cm in AP diameter, similar to previous study. Recommend follow-up every 12 months and vascular consultation. Reference: J Am Coll Radiol 2013;10:789-794. 6. Aortic Atherosclerosis  (ICD10-I70.0) and Emphysema (ICD10-J43.9). Electronically Signed   By: Carey Bullocks M.D.   On: 03/26/2023 12:19   DG Chest Port 1 View  Result Date: 03/26/2023 CLINICAL DATA:  Post bronchoscopy. EXAM: PORTABLE CHEST 1 VIEW COMPARISON:  Radiographs 12/07/2022 and 08/27/2022. CT 02/05/2023 PET-CT 03/25/2023. FINDINGS: 1126 hours. Stable volume loss in the right hemithorax from previous right lower lobe resection. Interval increased density at the right lung base attributed to post biopsy hemorrhage. Adjacent pleural thickening is unchanged. There is no pneumothorax. The left lung appears clear. The heart size and mediastinal contours are stable. Loop recorder noted in the left anterior chest wall. No acute osseous findings. IMPRESSION: No evidence of pneumothorax following bronchoscopy. Interval increased density at the right lung base attributed to post biopsy hemorrhage. Electronically Signed   By: Carey Bullocks M.D.   On: 03/26/2023 11:55   DG C-ARM BRONCHOSCOPY  Result Date: 03/26/2023 C-ARM BRONCHOSCOPY: Fluoroscopy was utilized by the requesting physician.  No radiographic interpretation.   DG C-Arm 1-60 Min-No Report  Result Date: 03/26/2023 Fluoroscopy was utilized by the requesting physician.  No radiographic interpretation.      IMPRESSION: Opacities and nodule in the right upper lobe suspicious but not diagnostic on biopsy for non- small cell lung cancer   I reviewed the situation again with the patient.  We discussed that he would be a candidate for  SBRT or ultra hypofractionated radiation therapy extending over 2 weeks,  for the 2 suspicious nodules.  We discussed the anticipated side effects with this treatment and potential long-term toxicities particularly as it relates to his previous lung cancer surgery and other medical issues.  He is concerned that his respiratory status may worsen given his prior history of lung cancer.  Given these issues he is more in favor of close  follow-up and not proceeding with radiation therapy at this time.   PLAN: As needed follow-up in radiation oncology.  He will continue to follow-up with Dr. Truett Perna.  Next appointment is for November 25.  Anticipate imaging studies in 3 to 6 months.  If these lesions progress then the patient may be more willing to consider radiation treatment.   15 minutes of total time was spent for this patient encounter, including preparation, counseling with the patient and coordination of care, and documentation of the encounter.   ------------------------------------------------  Billie Lade, PhD, MD  This document serves as a record of services personally performed by Antony Blackbird, MD. It was created on his behalf by Neena Rhymes, a trained medical scribe. The creation of this record is based on the scribe's personal observations and the provider's statements to them. This document has been checked and approved by the attending provider.

## 2023-04-22 NOTE — Telephone Encounter (Signed)
Rn called daughter for meaningful use and nurse evaluation information. Note completed and routed to Dr. Roselind Messier for review.

## 2023-04-23 ENCOUNTER — Ambulatory Visit: Admission: RE | Admit: 2023-04-23 | Payer: PPO | Source: Ambulatory Visit | Admitting: Radiation Oncology

## 2023-04-23 ENCOUNTER — Ambulatory Visit
Admission: RE | Admit: 2023-04-23 | Discharge: 2023-04-23 | Disposition: A | Discharge status: Home / Self Care | Payer: PPO | Source: Ambulatory Visit | Attending: Radiation Oncology | Admitting: Radiation Oncology

## 2023-04-23 DIAGNOSIS — C3491 Malignant neoplasm of unspecified part of right bronchus or lung: Secondary | ICD-10-CM

## 2023-04-23 DIAGNOSIS — C3411 Malignant neoplasm of upper lobe, right bronchus or lung: Secondary | ICD-10-CM

## 2023-04-23 DIAGNOSIS — Z87891 Personal history of nicotine dependence: Secondary | ICD-10-CM | POA: Diagnosis not present

## 2023-04-24 ENCOUNTER — Telehealth: Payer: Self-pay | Admitting: *Deleted

## 2023-04-24 DIAGNOSIS — Z85118 Personal history of other malignant neoplasm of bronchus and lung: Secondary | ICD-10-CM

## 2023-04-24 DIAGNOSIS — C187 Malignant neoplasm of sigmoid colon: Secondary | ICD-10-CM

## 2023-04-24 NOTE — Telephone Encounter (Signed)
Per Dr. Truett Perna: Patient declined SRS RT to lung lesion. Would like him to have CT chest without contrast 1 week before his November OV to f/u on status.  Called his daughter and she agrees to this. Order placed.

## 2023-04-25 DIAGNOSIS — G4733 Obstructive sleep apnea (adult) (pediatric): Secondary | ICD-10-CM | POA: Diagnosis not present

## 2023-04-26 ENCOUNTER — Encounter (HOSPITAL_COMMUNITY): Payer: Self-pay | Admitting: Oncology

## 2023-04-29 DIAGNOSIS — I509 Heart failure, unspecified: Secondary | ICD-10-CM | POA: Diagnosis not present

## 2023-04-29 DIAGNOSIS — R2681 Unsteadiness on feet: Secondary | ICD-10-CM | POA: Diagnosis not present

## 2023-04-29 DIAGNOSIS — J449 Chronic obstructive pulmonary disease, unspecified: Secondary | ICD-10-CM | POA: Diagnosis not present

## 2023-04-29 DIAGNOSIS — R531 Weakness: Secondary | ICD-10-CM | POA: Diagnosis not present

## 2023-04-29 DIAGNOSIS — K219 Gastro-esophageal reflux disease without esophagitis: Secondary | ICD-10-CM | POA: Diagnosis not present

## 2023-04-30 ENCOUNTER — Encounter (HOSPITAL_COMMUNITY): Payer: Self-pay | Admitting: Cardiology

## 2023-04-30 ENCOUNTER — Encounter (HOSPITAL_COMMUNITY): Payer: Self-pay | Admitting: Oncology

## 2023-04-30 ENCOUNTER — Ambulatory Visit (HOSPITAL_COMMUNITY)
Admission: RE | Admit: 2023-04-30 | Discharge: 2023-04-30 | Disposition: A | Discharge status: Home / Self Care | Payer: PPO | Source: Ambulatory Visit | Attending: Cardiology | Admitting: Cardiology

## 2023-04-30 VITALS — BP 154/62 | HR 52 | Wt 164.2 lb

## 2023-04-30 DIAGNOSIS — J439 Emphysema, unspecified: Secondary | ICD-10-CM | POA: Insufficient documentation

## 2023-04-30 DIAGNOSIS — C3490 Malignant neoplasm of unspecified part of unspecified bronchus or lung: Secondary | ICD-10-CM | POA: Insufficient documentation

## 2023-04-30 DIAGNOSIS — I5032 Chronic diastolic (congestive) heart failure: Secondary | ICD-10-CM | POA: Insufficient documentation

## 2023-04-30 DIAGNOSIS — Z87891 Personal history of nicotine dependence: Secondary | ICD-10-CM | POA: Insufficient documentation

## 2023-04-30 DIAGNOSIS — I130 Hypertensive heart and chronic kidney disease with heart failure and stage 1 through stage 4 chronic kidney disease, or unspecified chronic kidney disease: Secondary | ICD-10-CM | POA: Diagnosis not present

## 2023-04-30 DIAGNOSIS — I2510 Atherosclerotic heart disease of native coronary artery without angina pectoris: Secondary | ICD-10-CM | POA: Insufficient documentation

## 2023-04-30 DIAGNOSIS — I714 Abdominal aortic aneurysm, without rupture, unspecified: Secondary | ICD-10-CM | POA: Insufficient documentation

## 2023-04-30 DIAGNOSIS — J90 Pleural effusion, not elsewhere classified: Secondary | ICD-10-CM | POA: Insufficient documentation

## 2023-04-30 DIAGNOSIS — I480 Paroxysmal atrial fibrillation: Secondary | ICD-10-CM | POA: Diagnosis not present

## 2023-04-30 DIAGNOSIS — C187 Malignant neoplasm of sigmoid colon: Secondary | ICD-10-CM | POA: Diagnosis not present

## 2023-04-30 DIAGNOSIS — I739 Peripheral vascular disease, unspecified: Secondary | ICD-10-CM | POA: Diagnosis not present

## 2023-04-30 DIAGNOSIS — Z7901 Long term (current) use of anticoagulants: Secondary | ICD-10-CM | POA: Insufficient documentation

## 2023-04-30 DIAGNOSIS — Z79899 Other long term (current) drug therapy: Secondary | ICD-10-CM | POA: Diagnosis not present

## 2023-04-30 DIAGNOSIS — N183 Chronic kidney disease, stage 3 unspecified: Secondary | ICD-10-CM | POA: Insufficient documentation

## 2023-04-30 DIAGNOSIS — Z7984 Long term (current) use of oral hypoglycemic drugs: Secondary | ICD-10-CM | POA: Diagnosis not present

## 2023-04-30 DIAGNOSIS — I4819 Other persistent atrial fibrillation: Secondary | ICD-10-CM | POA: Insufficient documentation

## 2023-04-30 DIAGNOSIS — K269 Duodenal ulcer, unspecified as acute or chronic, without hemorrhage or perforation: Secondary | ICD-10-CM | POA: Insufficient documentation

## 2023-04-30 DIAGNOSIS — Z95820 Peripheral vascular angioplasty status with implants and grafts: Secondary | ICD-10-CM | POA: Diagnosis not present

## 2023-04-30 DIAGNOSIS — E785 Hyperlipidemia, unspecified: Secondary | ICD-10-CM | POA: Insufficient documentation

## 2023-04-30 DIAGNOSIS — Z902 Acquired absence of lung [part of]: Secondary | ICD-10-CM | POA: Diagnosis not present

## 2023-04-30 LAB — CBC
HCT: 40 % (ref 39.0–52.0)
Hemoglobin: 12.7 g/dL — ABNORMAL LOW (ref 13.0–17.0)
MCH: 28.4 pg (ref 26.0–34.0)
MCHC: 31.8 g/dL (ref 30.0–36.0)
MCV: 89.5 fL (ref 80.0–100.0)
Platelets: 282 10*3/uL (ref 150–400)
RBC: 4.47 MIL/uL (ref 4.22–5.81)
RDW: 15.4 % (ref 11.5–15.5)
WBC: 4.5 10*3/uL (ref 4.0–10.5)
nRBC: 0 % (ref 0.0–0.2)

## 2023-04-30 LAB — BASIC METABOLIC PANEL
Anion gap: 9 (ref 5–15)
BUN: 16 mg/dL (ref 8–23)
CO2: 24 mmol/L (ref 22–32)
Calcium: 9 mg/dL (ref 8.9–10.3)
Chloride: 101 mmol/L (ref 98–111)
Creatinine, Ser: 1.45 mg/dL — ABNORMAL HIGH (ref 0.61–1.24)
GFR, Estimated: 48 mL/min — ABNORMAL LOW (ref >60)
Glucose, Bld: 204 mg/dL — ABNORMAL HIGH (ref 70–99)
Potassium: 4.1 mmol/L (ref 3.5–5.1)
Sodium: 134 mmol/L — ABNORMAL LOW (ref 135–145)

## 2023-04-30 LAB — BRAIN NATRIURETIC PEPTIDE: B Natriuretic Peptide: 286 pg/mL — ABNORMAL HIGH (ref 0.0–100.0)

## 2023-04-30 MED ORDER — METOPROLOL SUCCINATE ER 25 MG PO TB24: 12.5000 mg | ORAL_TABLET | Freq: Every day | ORAL | 3 refills | Status: AC

## 2023-04-30 NOTE — Patient Instructions (Signed)
DECREASE Toprol XL to 12.5 mg daily.  Labs done today, your results will be available in MyChart, we will contact you for abnormal readings.  Your physician recommends that you schedule a follow-up appointment in: 3 months ( October) ** PLEASE CALL THE OFFICE IN MID AUGUST TO ARRANGE YOUR FOLLOW UP APPOINTMENT. **  If you have any questions or concerns before your next appointment please send [name] a message through [employer] or call our office at [phone].    TO LEAVE A MESSAGE FOR THE NURSE SELECT OPTION 2, PLEASE LEAVE A MESSAGE INCLUDING: YOUR NAME DATE OF BIRTH CALL BACK NUMBER REASON FOR CALL**this is important as we prioritize the call backs  YOU WILL RECEIVE A CALL BACK THE SAME DAY AS LONG AS YOU CALL BEFORE 4:00 PM  At the [facility], you and your health needs are our priority. As part of our continuing mission to provide you with exceptional heart care, we have created designated Provider Care Teams. These Care Teams include your primary Cardiologist (physician) and Advanced Practice Providers (APPs- Physician Assistants and Nurse Practitioners) who all work together to provide you with the care you need, when you need it.   You may see any of the following providers on your designated Care Team at your next follow up: Dr Arvilla Meres Dr Marca Ancona Dr. Marcos Eke, NP Robbie Lis, [place] [facility] [place], [place] Brynda Peon, NP Karle Plumber, PharmD   Please be sure to bring in all your medications bottles to every appointment.    Thank you for choosing [facility]

## 2023-05-01 NOTE — Progress Notes (Signed)
Patient ID: Troy Trembly [name], male   DOB: 1941-06-25, 82 y.o.   MRN: [id number] PCP: Dr. Lawerance Bach Cardiology: Dr. Shirlee Latch  Troy [name] 82 y.o. with history of HTN, COPD, active smoking/COPD, carotid stenosis s/p right CEA, paroxysmal atrial fibrillation, and PAD.   He does not have known obstructive CAD but is at high risk for CAD based on his comorbidities.  Lexiscan Cardiolite in 3/14 showed no ischemia or infarction and echo in 3/14 showed normal EF.  He had a left fem-pop bypass at the [facility] in the '90s.  He is followed at [facility] for PAD. He has chronic right calf/thigh/buttocks claudication that is unchanged over the last few years and follows regularly at [facility].  He has had right lower lobectomy for lung cancer.  He continues to stay off cigarettes.    At a prior appointment, he was in atrial fibrillation but did not realize it. Dr Shirlee Latch  started him on Eliquis and diltiazem CD, and he spontaneously converted to NSR.  In 5/15, he was at [facility] and felt "strange" one day: fatigued, weak, short of breath.  He went to the ER and was in atrial fibrillation with HR in 80s-90s.  He spontaneously converted to NSR in the ER.  He felt back to normal after converting to NSR.  Started on Multaq 400 mg bid.  With CHF, he was eventually transitioned over to amiodarone.   He had patch angioplasty revision of left fem-pop bypass in 11/16.  Now with minimal claudication.  He follows with [facility].     He had a Cardiolite and echo in 7/17 that were unremarkable.  Repeat Cardiolite in 12/17 showed no ischemia, echo was uninterpretable.  Cardiac MRI was therefore done in 1/18, showing EF 66% with normal-appearing RV, no late gadolinium enhancement.   Given increased exertional dyspnea and a defect on Cardiolite, he had LHC in 11/18.  This showed no obstructive CAD. He had a chest CT done given concern for possible amiodarone lung toxicity with increased dyspnea. This showed emphysema, no ILD.    In 5/19, he  developed a probable viral syndrome with dehydration and had a presyncopal episode.  He was orthostatic in the hospital.  He was noted to be in atrial fibrillation this admission which remained persistent.  He had a TEE-guided DCCV back to NSR.   In 9/19, he was admitted with GI bleeding and found to have a sigmoid mass on colonoscopy, path showed sigmoid adenocarcinoma.  He was noted to have right paratracheal lymphadenopathy but EUS with biopsy in 10/19 did not show malignancy.  He had surgical resection of colon cancer.   Amiodarone was stopped with elevated ESR and increased dyspnea.  He was also found to have hyperthyroidism. He was treated with methimazole by endocrinology.    He had atrial fibrillation ablation in 7/20.   Echo in 4/21 showed EF 60-65%, normal RV.    In 5/21, due to high atrial fibrillation burden, he had redo atrial fibrillation ablation.   Cardiolite in 6/21 showed EF 70%, no ischemia/infarction.   He was admitted in 10/22 with COVID-19 and COPD exacerbation.  He had a thoracentesis, cytology was negative for cancerous cells.  Echo in 10/22 showed EF 55-60%, moderate LVH, mildly decreased RV systolic function.   He was found to have choledocholithiasis with elevated LFTs, had ERCP with sphincterotomy in 5/23, refused cholecystectomy.  He was also admitted in 5/23 with upper GI bleeding.  He was found to have a duodenal ulcer  on EGD.   Follow up 8/23, found to be in atrial fibrillation with slow ventricular rate. Mildly volume overloaded and instructed to increase Lasix to 80 bid x 3 days then Lasix 80/60. S/p DCCV to NSR on 07/10/22. SCr noted to be 2.19, Lasix decreased to 60 bid and losartan stopped. Eliquis dose decreased to 2.5 bid.  Admitted 11/23 with AECOPD, CAP, and A/C HFpEF. Treated with antibiotics, nebs, and steroids. Echo with EF 50-55%, RV normal, IVC dilated.  Diuresed with IV lasix and transitioned to lasix 80 mg po twice a day. Discharged to [facility].   Peripheral angiogram was done in 3/24, diffuse disease but no intervention.   Patient has had recurrence of non-small cell lung cancer diagnosed by biopsy in 6/24.  He is not a surgical candidate.  He saw the radiation oncologist to discuss radiation therapy, for now will watch and see if the mass grows.   Today he returns for HF follow up.  He is now living at [facility] assisted living.  Weight is down about 20 lbs.  He is not very active, does not do much walking.  He has a trainer who comes in a couple of times a week and helps him walk in the hall.  Otherwise, he uses a wheelchair for longer distances.  Mild stable dyspnea when walking with his walker.  No chest pain.  No lightheadedness/syncope.  No falls.   ECG (Personally reviewed): Junctional rhythm rate 51, RBBB, inferolateral TWIs   Labs (3/13): LDL 75, HDL 35, K 4.1, creatinine 1.1 Labs (3/14): LDL 91, HDL 27 Labs (10/14): K 4.3, creatinine 0.98 Labs (4/15): K 4.9, creatinine 1.1, LDL 76, HDL 30 Labs (5/15): K 4.3, creatinine 1.7, BNP 251 Labs (6/15): K 4.4, creatinine 1.3 Labs (10/15): TSH normal Labs (5/16): K 4.2, creatinine 1.12, HCT 39.5, LFTs normal, LDL 84, HDL 43, TSH normal Labs (11/16): creatinine 0.94 Labs (4/17): LDL 39, HDL 39 Labs (6/17): K 4.5, creatinine 1.2 Labs (9/17): K 4, creatinine 1.25, HCT 38.7 Labs (12/17): K 4.5, creatinine 1.49 => 1.62, BNP 43 Labs (2/18): K 3.8, creatinine 1.33, LFTs normal, TSH normal Labs (6/18): K 4.3, creatinine 1.49, LFTs normal, TSH normal, hgb 12.3 Labs (11/18): ESR 60, TSH normal, LFTs normal, creatinine 1.34 Labs (1/19): LDL 50 Labs (5/19): K 4.6, creatinine 1.55, LFTs normal, hgb 12.7, LFTs normal Labs (10/19): K 4 => 4.1, creatinine 1.39 => 1.43, BNP 279, hgb 10.7, TSH low, free T4 and free T3 low, ESR 108 Labs (3/21): K 4.5, creatinine 1.19, TSH normal Labs (4/21): K 4.8, creatinine 1.28, BNP 524, LDL 59 Labs (7/21): hgb 11.5, creatinine 1.4 Labs (8/21): K 4.3,  creatinine 1.09 Labs (9/21): hgb 12.7 Labs (2/22): K 5, creatinine 1.54 Labs (5/22): K 4.2, creatinine 1.38, hgb 14.3, LDL 31 Labs (10/22): LDL 41, TGs 351, hgb 13.4, K 4.3, creatinine 1.57 Labs (1122): K 3.6, creatinine 1.32 Labs (1/23): K 4.0, creatinine 1.31, LDL 34 Labs (4/23): K 4.2, creatinine 1.66, LFTs normal, hgb 13.3, TSH normal Labs (5/23): K 3.2, creatinine 1.49 Labs (7/23): hgb 12 Labs (8/23): K 4.3, creatinine 1.76 Labs (9/23): hgb 12.5 Labs (10/23): K 4.5, creatinine 1.96 => 1.51 Labs (08/29/22): K 4.1 Creatinine 1.54, LDL 42, TGs 146 Labs (12/23): K 4.1, creatinine 1.58 Labs (6/24): K 3.6, creatinine 1.36  PMH: 1. HTN: Fatigue and cough with ramipril use.  2. COPD: Quit smoking 2014. PFTs (1/18) with moderately severe COPD.  - CT chest (11/18): Emphysema noted, no ILD.  3. AAA: CT 1/13 with 3.0 cm AAA.  Abdominal US (1/14) with 3.4 cm AAA. Abdominal US (4/15) with 3.25 x 3.27 AAA. Abdominal US (3/16) with 3.7 cm AAA.  Followed at [facility].  - Abd [name] (1/19): 3.3 cm AAA. 4. Squamous cell lung cancer diagnosed 2/12.  Had right lower lobectomy in 4/12.  - Recurrence of non-small cell lung cancer 6/24 5. Hyperlipidemia 6. PAD: Left fem-pop bypass 1994.  ABIs (2/12) 0.62 on right, 0.95 on left. ABIs (1/14): 0.65 on right, 1.04 on left. ABIs (4/15) 0.66 right, 0.87 left.  ABIs (3/16) 0.6 right, 0.88 left.  Patch angioplasty left fem-pop bypass in 11/16.   - Left fem-pop bypass patent on doppler evaluation in 12/17.  - ABIs (2/21): normal on left (1.02), moderately decreased on right (0.54).  - Peripheral arterial dopplers (3/23): >50% right iliac stenosis, ABIs 0.58 on right and 1.11 on left.  - Peripheral angiogram (3/24): Diffuse disease, no intervention.  7. CAD: Stress myoview 2004 was normal. Lexiscan Cardiolite (3/14) with EF 66%, no ischemia or infarction. Lexiscan Cardiolite (7/17) with EF 61%, no ischemia/infarction.  - Cardiolite (12/17) with EF 62%,  inferior/inferolateral fixed defect, most likely diaphragmatic attenuation, no ischemia.  - Cardiolite (9/18) with EF > 65%, fixed inferior defect (attenuation versus infarction), no ischemia.  - LHC (11/18): No obstructive CAD.  - Cardiolite (6/21): EF 70%, no ischemia/infarction 8. Chronic diastolic CHF: Echo (3/14): technically difficult with EF 55-60%, upper normal RV size.  Echo (5/16) with EF 60-65%.  Echo (7/17) with EF 55-60%, normal RV size and systolic function.  - Echo 12/17 with very poor windows, unable to comment on LV or RV function.  - Cardiac MRI (1/18) with EF 66%, normal RV size and systolic function.  - TEE (5/19): EF 55-60%, normal RV size and systolic function.  - Echo (4/21): EF 60-65%, normal RV - Echo (10/22): EF 55-60%, moderate LVH, mildly decreased RV systolic function - Echo (11/23): EF 50-55%, RV normal, IVC dilated  9. Carotid stenosis: TIA 10/14.  Carotid dopplers with > 80% RICA, 60-79% LICA.  Patient had right CEA in 10/14. Carotids (4/15) patent right CEA, LICA 40-59% stenosis.  Carotids (16/10) < 40% LICA, patent right CEA.  - Carotid dopplers (12/17): right CEA ok, < 39% LICA stenosis.  - Carotid dopplers (1/19): Right CEA ok, 1-39% LICA stenosis.  - Carotid dopplers (2/21): Right CEA ok, LICA 40-59% stenosis.  - Carotid dopplers (3/23): Right CEA ok, LICA 1-39% 10. Atrial fibrillation: Paroxysmal. First noted after lobectomy in 4/12 (brief), recurrence in 4/15 then in 5/15.  - TEE-guided DCCV for persistent atrial fibrillation in 5/19.  - Atrial fibrillation ablation 7/20.  - Re-do atrial fibrillation ablation 5/21.  - DCCV 10/23 11. Spinal stenosis 12. OSA: Using CPAP.  13. Glaucoma 14. Has ILR 15. Colon cancer: Sigmoid adenocarcinoma, s/p surgical resection.  16. 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  17. COVID-19 10/22 18. Choledocholithiasis 19. Upper GI bleeding in 5/23 from duodenal ulcer.   SH: Married, lives in [place].   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.  Quit smoking in 2014.  Rare ETOH now.   FH: No premature CAD  ROS: All systems reviewed and negative except as per HPI.   Current Outpatient Medications  Medication Sig Dispense Refill   acetaminophen (TYLENOL) 325 MG tablet Take 650 mg by mouth every 6 (six) hours as needed for moderate pain or headache.     albuterol (VENTOLIN HFA) 108 (90 Base) MCG/ACT inhaler Inhale 2  puffs into the lungs every 6 (six) hours as needed for wheezing or shortness of breath.     amLODipine (NORVASC) 10 MG tablet Take 10 mg by mouth daily.     apixaban (ELIQUIS) 2.5 MG TABS tablet Take 1 tablet (2.5 mg total) by mouth 2 (two) times daily. Okay to restart this medication on 03/27/2023 60 tablet    Brinzolamide-Brimonidine (SIMBRINZA) 1-0.2 % SUSP Place 1 drop into both eyes 3 (three) times daily. 8a, 2p, 8p     Budeson-Glycopyrrol-Formoterol (BREZTRI AEROSPHERE) 160-9-4.8 MCG/ACT AERO Inhale 2 puffs into the lungs in the morning and at bedtime. 10.7 g 0   docusate sodium (COLACE) 100 MG capsule Take 100 mg by mouth 2 (two) times daily as needed for moderate constipation.     escitalopram (LEXAPRO) 10 MG tablet TAKE ONE TABLET BY MOUTH EVERY DAY 28 tablet 5   furosemide (LASIX) 20 MG tablet Take 80 mg by mouth daily.     JARDIANCE 10 MG TABS tablet TAKE ONE TABLET BY MOUTH EVERY DAY BEFORE BREAKFAST. 30 tablet 11   latanoprost (XALATAN) 0.005 % ophthalmic solution Place 1 drop into both eyes at bedtime.     Melatonin 10 MG TABS Take 20 mg by mouth at bedtime.     montelukast (SINGULAIR) 10 MG tablet TAKE ONE TABLET BY MOUTH AT BEDTIME 28 tablet 5   omeprazole (PRILOSEC OTC) 20 MG tablet Take 20 mg by mouth daily.     Polyethyl Glycol-Propyl Glycol (SYSTANE ULTRA) 0.4-0.3 % SOLN Place 1 drop into both eyes daily as needed (dry eyes).     potassium chloride SA (KLOR-CON M) 20 MEQ tablet TAKE TWO TABLETS BY MOUTH IN THE MORNING AND TAKE ONE TABLET IN THE EVENING (Patient taking  differently: Take 40 mEq by mouth daily.) 90 tablet 6   PRESCRIPTION MEDICATION Pt uses CPAP machine at bedtime     rosuvastatin (CRESTOR) 40 MG tablet TAKE ONE TABLET BY MOUTH EVERY DAY 90 tablet 3   senna-docusate (SENOKOT-S) 8.6-50 MG tablet Take 1 tablet by mouth daily.     spironolactone (ALDACTONE) 25 MG tablet Take 25 mg by mouth daily.     metoprolol succinate (TOPROL-XL) 25 MG 24 hr tablet Take 0.5 tablets (12.5 mg total) by mouth daily. 45 tablet 3   No current facility-administered medications for this encounter.   Wt Readings from Last 3 Encounters:  04/30/23 74.5 kg (164 lb 3.2 oz)  04/16/23 78.6 kg (173 lb 3.2 oz)  04/05/23 78.8 kg (173 lb 12.8 oz)   BP (!) 154/62   Pulse (!) 52   Wt 74.5 kg (164 lb 3.2 oz)   SpO2 92%   BMI 21.66 kg/m    General: NAD, frail Neck: JVP 8 cm, no thyromegaly or thyroid nodule.  Lungs: Decreased BS right base.  CV: Nondisplaced PMI.  Heart regular S1/S2, no S3/S4, no murmur.  Trace ankle edema.  No carotid bruit.  Normal pedal pulses.  Abdomen: Soft, nontender, no hepatosplenomegaly, no distention.  Skin: Intact without lesions or rashes.  Neurologic: Alert and oriented x 3.  Psych: Normal affect. Extremities: No clubbing or cyanosis.  HEENT: Normal.   Assessment/Plan: 1. Chronic diastolic CHF: Echo in 10/22 with EF 55-60%, mild LVH, mildly dysfunctional RV. Echo in 11/23 with EF 50-55%, RV normal, IVC dilated.  NYHA class III, likely primarily due to deconditioning and COPD.  He is not volume overloaded on exam.  - Continue Jardiance 10 mg daily - Continue Lasix 80 mg daily.  BMET/BNP today. 2. Hyperlipidemia: Good lipids in 11/23. - Continue Crestor 40 mg daily.  3. Carotid stenosis: s/p R CEA.  Dopplers followed at [facility].  4. PAD: s/p patch angioplasty to left fem-pop bypass in 11/16.  Followed at [facility].  Peripheral arterial dopplers in 3/23 with >50% right iliac stenosis. Peripheral angiography in 3/24 with diffuse disease but nothing  critical and no intervention.  No claudication.  - Follows with [facility].  5. AAA: Stable on last [name], followed at [facility].   6. COPD: No longer smoking. PFTs in 1/18 showed moderately severe obstructive defect. CT chest in 11/18 showed emphysema.  COPD contributes to his dyspnea. 7. Atrial fibrillation: S/p atrial fibrillation ablation in 7/20 and redo in 5/21.  He has been primarily in NSR since redo ablation.  Back in slow atrial fibrillation 9/23 and s/p DCCV 10/23 to NSR. He saw EP, poor candidate for redo ablation.  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 GFR would limit Tikosyn to the lowest dose, probably not a good choice for him. Today, he is in a stable junctional rhythm rate 50s.  - With junctional bradycardia, decrease Toprol XL to 12.5 mg daily.  - Continue Eliquis 2.5 mg bid. (Reduced dose with creatinine and age). - Continue to use CPAP.  8. CKD: Stage 3. Check BMET today  9. CAD: LHC in 11/18 with nonobstructive disease only.  Cardiolite in 6/21 with no ischemia/infarction. No chest pain.  10. 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  He is no longer on methimazole, follows with endocrinology.  No longer on amiodarone.  11. Colon cancer: Sigmoid adenocarcinoma, s/p surgical resection. In remission.  12. HTN: Mildly hypertensive today but will continue current regimen.  13. Pleural effusion: Chronic right pleural effusion, s/p right lower lobectomy.   14. Non-small cell lung cancer: S/p right lower lobectomy.  Now with recurrence cocumented by biopsy in 6/24.  Too frail for surgery, for now planning watchful waiting.  Radiation would be an option.   15. Upper GI bleeding: In 5/23 due to duodenal ulcer.  - Continue PPI.  - CBC today.   Followup in 3 months.   Marca Ancona  05/01/2023

## 2023-05-07 ENCOUNTER — Encounter (HOSPITAL_COMMUNITY): Payer: Self-pay

## 2023-05-13 ENCOUNTER — Telehealth: Payer: Self-pay | Admitting: *Deleted

## 2023-05-13 NOTE — Telephone Encounter (Signed)
Notified daughter, Wilkie Aye that recent molecular testing for PD-L1 shows he is not a good candidate for immunotherapy, additional testing for targeted therapies could not be performed due to lack of adequate tissue. We can consider performing this testing from the peripheral blood in the future, follow-up as scheduled. She agrees to pass this information to Mr. Amber.

## 2023-05-20 ENCOUNTER — Ambulatory Visit: Payer: PPO

## 2023-05-26 DIAGNOSIS — G4733 Obstructive sleep apnea (adult) (pediatric): Secondary | ICD-10-CM | POA: Diagnosis not present

## 2023-05-30 DIAGNOSIS — R531 Weakness: Secondary | ICD-10-CM | POA: Diagnosis not present

## 2023-05-30 DIAGNOSIS — J449 Chronic obstructive pulmonary disease, unspecified: Secondary | ICD-10-CM | POA: Diagnosis not present

## 2023-05-30 DIAGNOSIS — K219 Gastro-esophageal reflux disease without esophagitis: Secondary | ICD-10-CM | POA: Diagnosis not present

## 2023-05-30 DIAGNOSIS — I509 Heart failure, unspecified: Secondary | ICD-10-CM | POA: Diagnosis not present

## 2023-05-30 DIAGNOSIS — R2681 Unsteadiness on feet: Secondary | ICD-10-CM | POA: Diagnosis not present

## 2023-06-05 ENCOUNTER — Encounter: Payer: Self-pay | Admitting: Internal Medicine

## 2023-06-06 ENCOUNTER — Encounter: Payer: Self-pay | Admitting: Internal Medicine

## 2023-06-06 ENCOUNTER — Other Ambulatory Visit: Payer: Self-pay | Admitting: *Deleted

## 2023-06-06 DIAGNOSIS — I714 Abdominal aortic aneurysm, without rupture, unspecified: Secondary | ICD-10-CM

## 2023-06-06 DIAGNOSIS — I739 Peripheral vascular disease, unspecified: Secondary | ICD-10-CM

## 2023-06-06 NOTE — Progress Notes (Signed)
Subjective:    Patient ID: Troy Bruce [name], male    DOB: 1941/09/29, 82 y.o.   MRN: [id number]      HPI Troy [name] is here for  Chief Complaint  Patient presents with   Ear Pain    Left ear and jaw pain (Saw dentist yesterday)    Left ear pain - saw dentist yesterday - xrays normal.  Put on mouth wash for bacterial infection.  Left ear pain - Going on for 3 months.   Yesterday right ear hurt as well.  Pain with yawning and chewing.     Daughter is concerned about his depression and weight loss.  He is also mixing up dreams and reality more and more.     Medications and allergies reviewed with patient and updated if appropriate.  Current Outpatient Medications on File Prior to Visit  Medication Sig Dispense Refill   acetaminophen (TYLENOL) 325 MG tablet Take 650 mg by mouth every 6 (six) hours as needed for moderate pain or headache.     albuterol (VENTOLIN HFA) 108 (90 Base) MCG/ACT inhaler Inhale 2 puffs into the lungs every 6 (six) hours as needed for wheezing or shortness of breath.     amLODipine (NORVASC) 10 MG tablet Take 10 mg by mouth daily.     apixaban (ELIQUIS) 2.5 MG TABS tablet Take 1 tablet (2.5 mg total) by mouth 2 (two) times daily. Okay to restart this medication on 03/27/2023 60 tablet    Brinzolamide-Brimonidine (SIMBRINZA) 1-0.2 % SUSP Place 1 drop into both eyes 3 (three) times daily. 8a, 2p, 8p     Budeson-Glycopyrrol-Formoterol (BREZTRI AEROSPHERE) 160-9-4.8 MCG/ACT AERO Inhale 2 puffs into the lungs in the morning and at bedtime. 10.7 g 0   docusate sodium (COLACE) 100 MG capsule Take 100 mg by mouth 2 (two) times daily as needed for moderate constipation.     escitalopram (LEXAPRO) 10 MG tablet TAKE ONE TABLET BY MOUTH EVERY DAY 28 tablet 5   furosemide (LASIX) 20 MG tablet Take 80 mg by mouth daily.     JARDIANCE 10 MG TABS tablet TAKE ONE TABLET BY MOUTH EVERY DAY BEFORE BREAKFAST. 30 tablet 11   latanoprost (XALATAN) 0.005 % ophthalmic solution  Place 1 drop into both eyes at bedtime.     Melatonin 10 MG TABS Take 20 mg by mouth at bedtime.     metoprolol succinate (TOPROL-XL) 25 MG 24 hr tablet Take 0.5 tablets (12.5 mg total) by mouth daily. 45 tablet 3   montelukast (SINGULAIR) 10 MG tablet TAKE ONE TABLET BY MOUTH AT BEDTIME 28 tablet 5   omeprazole (PRILOSEC OTC) 20 MG tablet Take 20 mg by mouth daily.     omeprazole (PRILOSEC) 20 MG capsule Take 20 mg by mouth daily.     Polyethyl Glycol-Propyl Glycol (SYSTANE ULTRA) 0.4-0.3 % SOLN Place 1 drop into both eyes daily as needed (dry eyes).     potassium chloride SA (KLOR-CON M) 20 MEQ tablet TAKE TWO TABLETS BY MOUTH IN THE MORNING AND TAKE ONE TABLET IN THE EVENING (Patient taking differently: Take 40 mEq by mouth daily.) 90 tablet 6   PRESCRIPTION MEDICATION Pt uses CPAP machine at bedtime     rosuvastatin (CRESTOR) 40 MG tablet TAKE ONE TABLET BY MOUTH EVERY DAY 90 tablet 3   senna-docusate (SENOKOT-S) 8.6-50 MG tablet Take 1 tablet by mouth daily.     spironolactone (ALDACTONE) 25 MG tablet Take 25 mg by mouth daily.     No  current facility-administered medications on file prior to visit.    Review of Systems  Constitutional:  Negative for fever.  HENT:  Positive for congestion (from time to time), ear discharge and ear pain. Negative for postnasal drip, sinus pain and sore throat.   Neurological:  Negative for headaches.       Objective:   Vitals:   06/07/23 1406  BP: 132/60  Pulse: 73  Temp: 97.9 F (36.6 C)  SpO2: 94%   BP Readings from Last 3 Encounters:  06/07/23 132/60  04/30/23 (!) 154/62  04/16/23 (!) 150/61   Wt Readings from Last 3 Encounters:  06/07/23 168 lb (76.2 kg)  04/30/23 164 lb 3.2 oz (74.5 kg)  04/16/23 173 lb 3.2 oz (78.6 kg)   Body mass index is 22.16 kg/m.    Physical Exam Constitutional:      General: He is not in acute distress.    Appearance: Normal appearance. He is not ill-appearing.  HENT:     Head: Normocephalic and  atraumatic.     Right Ear: Tympanic membrane, ear canal and external ear normal. There is no impacted cerumen.     Left Ear: Tympanic membrane, ear canal and external ear normal. There is no impacted cerumen.  Cardiovascular:     Rate and Rhythm: Normal rate and regular rhythm.  Pulmonary:     Effort: Pulmonary effort is normal.     Breath sounds: Normal breath sounds.  Skin:    General: Skin is warm and dry.  Neurological:     Mental Status: He is alert.            Assessment & Plan:    See Problem List for Assessment and Plan of chronic medical problems.

## 2023-06-07 ENCOUNTER — Ambulatory Visit (INDEPENDENT_AMBULATORY_CARE_PROVIDER_SITE_OTHER): Payer: PPO | Admitting: Internal Medicine

## 2023-06-07 VITALS — BP 132/60 | HR 73 | Temp 97.9°F | Ht 73.0 in | Wt 168.0 lb

## 2023-06-07 DIAGNOSIS — R413 Other amnesia: Secondary | ICD-10-CM

## 2023-06-07 DIAGNOSIS — M26629 Arthralgia of temporomandibular joint, unspecified side: Secondary | ICD-10-CM | POA: Insufficient documentation

## 2023-06-07 DIAGNOSIS — M26623 Arthralgia of bilateral temporomandibular joint: Secondary | ICD-10-CM

## 2023-06-07 DIAGNOSIS — F3289 Other specified depressive episodes: Secondary | ICD-10-CM | POA: Diagnosis not present

## 2023-06-07 MED ORDER — DICLOFENAC SODIUM 1 % EX GEL: 2.0000 g | Freq: Four times a day (QID) | CUTANEOUS | Status: AC

## 2023-06-07 MED ORDER — ESCITALOPRAM OXALATE 20 MG PO TABS: 20.0000 mg | ORAL_TABLET | Freq: Every day | ORAL | 3 refills | Status: AC

## 2023-06-07 NOTE — Patient Instructions (Signed)
Ear pain is likely  TMJ arthralgia    Medications changes include :   increase lexapro to 20 mg daily.   Start voltaren gel 1-2 times a day to TMJ joints as needed     Temporomandibular Joint Syndrome  Temporomandibular joint syndrome (TMJ syndrome) is a condition that causes pain in the temporomandibular joints. These joints are located near your ears and allow your jaw to open and close. For people with TMJ syndrome, chewing, biting, or other movements of the jaw can be difficult or painful. TMJ syndrome is often mild and goes away within a few weeks. However, sometimes the condition becomes a long-term (chronic) problem. What are the causes? This condition may be caused by: Grinding your teeth or clenching your jaw. Some people do this when they are stressed. Arthritis. An injury to the jaw. A head or neck injury. Teeth or dentures that are not aligned well. In some cases, the cause of TMJ syndrome may not be known. What are the signs or symptoms? The most common symptom of this condition is aching pain on the side of the head in the area of the TMJ. Other symptoms may include: Pain when moving your jaw, such as when chewing or biting. Not being able to open your jaw all the way. Making a clicking sound when you open your mouth. Headache. Earache. Neck or shoulder pain. How is this diagnosed? This condition may be diagnosed based on: Your symptoms and medical history. A physical exam. Your health care provider may check the range of motion of your jaw. Imaging tests, such as X-rays or an MRI. You may also need to see your dentist, who will check if your teeth and jaw are lined up correctly. How is this treated? TMJ syndrome often goes away on its own. If treatment is needed, it may include: Eating soft foods and applying ice or heat. Medicines to relieve pain or inflammation. Medicines or massage to relax the muscles. A splint, bite plate, or mouthpiece to prevent teeth  grinding or jaw clenching. Relaxation techniques or counseling to help reduce stress. A therapy for pain in which an electrical current is applied to the nerves through the skin (transcutaneous electrical nerve stimulation). Acupuncture. This may help to relieve pain. Jaw surgery. This is rarely needed. Follow these instructions at home:  Eating and drinking Eat a soft diet if you are having trouble chewing. Avoid foods that require a lot of chewing. Do not chew gum. General instructions Take over-the-counter and prescription medicines only as told by your health care provider. If directed, put ice on the painful area. To do this: Put ice in a plastic bag. Place a towel between your skin and the bag. Leave the ice on for 20 minutes, 2-3 times a day. Remove the ice if your skin turns bright red. This is very important. If you cannot feel pain, heat, or cold, you have a greater risk of damage to the area. Apply a warm, wet cloth (warm compress) to the painful area as told. Massage your jaw area and do any jaw stretching exercises as told by your health care provider. If you were given a splint, bite plate, or mouthpiece, wear it as told by your health care provider. Keep all follow-up visits. This is important. Where to find more information [employer]: [employer] Contact a health care provider if: You have trouble eating. You have new or worsening symptoms. Get help right away if: Your jaw  locks. Summary Temporomandibular joint syndrome (TMJ syndrome) is a condition that causes pain in the temporomandibular joints. These joints are located near your ears and allow your jaw to open and close. TMJ syndrome is often mild and goes away within a few weeks. However, sometimes the condition becomes a long-term (chronic) problem. Symptoms include an aching pain on the side of the head in the area of the TMJ, pain when chewing or biting, and  being unable to open your jaw all the way. You may also make a clicking sound when you open your mouth. TMJ syndrome often goes away on its own. If treatment is needed, it may include medicines to relieve pain, reduce inflammation, or relax the muscles. A splint, bite plate, or mouthpiece may also be used to prevent teeth grinding or jaw clenching. This information is not intended to replace advice given to you by your health care provider. Make sure you discuss any questions you have with your health care provider. Document Revised: 04/30/2021 Document Reviewed: 04/30/2021 Elsevier Patient Education  2024 [employer].

## 2023-06-07 NOTE — Assessment & Plan Note (Signed)
He does not seem too concerned-he did ask his caregiver here today which she thought and she does acknowledge some memory issues, but feels like they are to be expected Will increase Lexapro to see if that helps Can consider seeing a neurologist for further evaluation in the near future if we would like

## 2023-06-07 NOTE — Assessment & Plan Note (Signed)
Chronic ?  Well-controlled or not His caregiver, him and his daughter all agree especially may not be as well-controlled as ideal Increase Lexapro to 20 mg daily

## 2023-06-07 NOTE — Assessment & Plan Note (Signed)
Acute Having left ear pain x 3 months and just recently right ear pain Exam of the ears normal Pain is worse with yawning, chewing Likely TMJ arthralgia Cannot take NSAIDs Can try warm compresses, cool compresses and topical Voltaren gel If no improvement may need to see dentist or oral surgeon

## 2023-06-13 ENCOUNTER — Ambulatory Visit (HOSPITAL_COMMUNITY)
Admission: RE | Admit: 2023-06-13 | Discharge: 2023-06-13 | Disposition: A | Discharge status: Home / Self Care | Payer: PPO | Source: Ambulatory Visit | Attending: Vascular Surgery | Admitting: Vascular Surgery

## 2023-06-13 ENCOUNTER — Ambulatory Visit (INDEPENDENT_AMBULATORY_CARE_PROVIDER_SITE_OTHER)
Admission: RE | Admit: 2023-06-13 | Discharge: 2023-06-13 | Disposition: A | Discharge status: Home / Self Care | Payer: PPO | Source: Ambulatory Visit | Attending: Vascular Surgery | Admitting: Vascular Surgery

## 2023-06-13 ENCOUNTER — Ambulatory Visit: Payer: PPO | Admitting: Physician Assistant

## 2023-06-13 VITALS — BP 172/66 | HR 66 | Temp 98.5°F | Resp 18 | Ht 73.0 in | Wt 168.0 lb

## 2023-06-13 DIAGNOSIS — I714 Abdominal aortic aneurysm, without rupture, unspecified: Secondary | ICD-10-CM | POA: Insufficient documentation

## 2023-06-13 DIAGNOSIS — I6523 Occlusion and stenosis of bilateral carotid arteries: Secondary | ICD-10-CM | POA: Diagnosis not present

## 2023-06-13 DIAGNOSIS — I739 Peripheral vascular disease, unspecified: Secondary | ICD-10-CM | POA: Diagnosis not present

## 2023-06-13 LAB — VAS US ABI WITH/WO TBI
Left ABI: 0.97
Right ABI: 0.51

## 2023-06-13 NOTE — Progress Notes (Signed)
Office Note     CC:  follow up Requesting Provider:  Pincus Sanes, MD  HPI: Troy Castaldo [name] is a 82 y.o. (1941/03/27) male who presents for surveillance of AAA as well as PAD with history of left leg bypass.  He currently resides in a assisted living facility in [place].  He has history of left femoropopliteal bypass graft performed in [place] in 1994.  This was revised by Dr. Edilia Bo in 2001.  He had angioplasty of this in 2016.  He denies any rest pain or tissue loss of bilateral lower extremities.  He states he is minimally ambulatory with a walker.  His nursing assistant here today states he has fallen several times while trying to go to the bathroom at night.  He also has history of AAA.  He was last seen in March of this year with a duplex measuring largest diameter of AAA at 4.5 cm.  He denies any new or changing abdominal or back pain.  He is also seen annually for carotid artery stenosis.  He denies any diagnosis of CVA or TIA since last office visit.  He also denies any current strokelike symptoms including slurring speech or one-sided weakness.  He is legally blind.   Past Medical History:  Diagnosis Date   AAA (abdominal aortic aneurysm) (HCC) 10-20-17   MONITORED BY DR Edilia Bo, 3.7 CM by ABDOMINAL US 11/2021   Anxiety    Arthritis    Atrial fibrillation (HCC)    Basal cell carcinoma    BPH (benign prostatic hypertrophy)    Chronic diastolic heart failure (HCC)    Chronic kidney disease    stage 3, pt unaware   Colon cancer (HCC)    COPD (chronic obstructive pulmonary disease) (HCC)    Depression    GERD (gastroesophageal reflux disease)    Glaucoma BOTH EYES   Dr Nile Riggs   History of lung cancer APRIL 2012   ONCOLOGIST- DR Mariel Sleet  LOV IN [facility] 10-27-2012 SQUAMOUS CELL---- S/P RIGHT LOWER LOBECTOMY AT [facility] -- NO CHEMORADIATION--- NO RECURRENCE   Hx of adenomatous colonic polyps 2005    X 2; 1 hyperplastic polyp; Dr Juanda Chance   Hyperlipidemia     Hypertension    OSA (obstructive sleep apnea) 08/29/2015   CPAP SET ON 10   Peripheral vascular disease (HCC)    FOLLOWED  BY DR DICKSON   Spinal stenosis 06/2014   Thoracic aorta atherosclerosis (HCC)    Thyrotoxicosis    amiodarone induced    Past Surgical History:  Procedure Laterality Date   ABDOMINAL AORTOGRAM W/LOWER EXTREMITY N/A 12/20/2022   Procedure: ABDOMINAL AORTOGRAM W/LOWER EXTREMITY;  Surgeon: Cephus Shelling, MD;  [facility];  Service: Cardiovascular;  Laterality: N/A;   ANGIO PLASTY     X 4 in legs   AORTOGRAM  07-27-2002   MILD DIFFUSE ILIAC ARTERY OCCLUSIVE DISEASE /  LEFT RENAL ARTERY 20%/ PATENT LEFT FEM-POP GRAFT/ MILD SFA AND POPLITEAL ARTERY OCCLUSIVE DISEASE W/ SEVERE KIDNEY OCCLUSIVE DISEASE   ATRIAL FIBRILLATION ABLATION N/A 04/09/2019   Procedure: ATRIAL FIBRILLATION ABLATION;  Surgeon: Hillis Range, MD;  [facility];  Service: Cardiovascular;  Laterality: N/A;   ATRIAL FIBRILLATION ABLATION N/A 02/12/2020   Procedure: ATRIAL FIBRILLATION ABLATION;  Surgeon: Hillis Range, MD;  [facility];  Service: Cardiovascular;  Laterality: N/A;   BALLOON DILATION N/A 02/06/2022   Procedure: BALLOON Tamponade ampulla;  Surgeon: Meridee Score Netty Starring., MD;  Location: [facility];  Service: Gastroenterology;  Laterality: N/A;   BASAL CELL CARCINOMA EXCISION     MULTIPLE TIMES--  RIGHT FOREARM, CHEEKS, AND BACK   BIOPSY  06/25/2018   Procedure: BIOPSY;  Surgeon: Meridee Score Netty Starring., MD;  Location: [facility] ENDOSCOPY;  Service: Gastroenterology;;   BIOPSY  02/06/2022   Procedure: BIOPSY;  Surgeon: Lemar Lofty., MD;  Location: [facility];  Service: Gastroenterology;;   BRONCHIAL BIOPSY  03/26/2023   Procedure: BRONCHIAL BIOPSIES;  Surgeon: [name], MD;  Location: [facility] ENDOSCOPY;  Service: Pulmonary;;   BRONCHIAL BRUSHINGS  03/26/2023   Procedure: BRONCHIAL BRUSHINGS;  Surgeon: [name], MD;   Location: [facility] ENDOSCOPY;  Service: Pulmonary;;   BRONCHIAL NEEDLE ASPIRATION BIOPSY  03/26/2023   Procedure: BRONCHIAL NEEDLE ASPIRATION BIOPSIES;  Surgeon: [name], MD;  Location: [facility] ENDOSCOPY;  Service: Pulmonary;;   BRONCHIAL WASHINGS  03/26/2023   Procedure: BRONCHIAL WASHINGS;  Surgeon: [name], MD;  [facility];  Service: Pulmonary;;   CARDIOVASCULAR STRESS TEST  12-08-2012  DR MCLEAN   NORMAL LEXISCAN WITH NO EXERCISE NUCLEAR STUDY/ EF 66%/   NO ISCHEMIA/ NO SIGNIFICANT CHANGE FROM PRIOR STUDY   CARDIOVERSION N/A 02/14/2018   Procedure: CARDIOVERSION;  Surgeon: Laurey Morale, MD;  Location: [facility] ENDOSCOPY;  Service: Cardiovascular;  Laterality: N/A;   CARDIOVERSION N/A 07/10/2022   Procedure: CARDIOVERSION;  Surgeon: Laurey Morale, MD;  Location: [facility] ENDOSCOPY;  Service: Cardiovascular;  Laterality: N/A;   CAROTID ANGIOGRAM N/A 07/10/2013   Procedure: CAROTID ANGIOGRAM;  Surgeon: Sherren Kerns, MD;  Location: [facility] CATH LAB;  Service: Cardiovascular;  Laterality: N/A;   CAROTID ENDARTERECTOMY Right 07-14-13   cea   CATARACT EXTRACTION W/ INTRAOCULAR LENS  IMPLANT, BILATERAL     colon polyectomy     COLONOSCOPY     COLONOSCOPY WITH PROPOFOL N/A 06/25/2018   Procedure: COLONOSCOPY WITH PROPOFOL;  Surgeon: Meridee Score Netty Starring., MD;  Location: [facility] ENDOSCOPY;  Service: Gastroenterology;  Laterality: N/A;   CYSTOSCOPY W/ RETROGRADES Bilateral 01/21/2013   Procedure: CYSTOSCOPY WITH RETROGRADE PYELOGRAM;  Surgeon: Milford Cage, MD;  Location: [facility];  Service: Urology;  Laterality: Bilateral;   CYSTO, BLADDER BIOPSY, BILATERAL RETROGRADE PYELOGRAM  RAD TECH FROM RADIOLOGY PER JOY   CYSTOSCOPY WITH BIOPSY N/A 01/21/2013   Procedure: CYSTOSCOPY WITH BIOPSY;  Surgeon: Milford Cage, MD;  Location: [facility];  Service: Urology;  Laterality: N/A;   ENDARTERECTOMY Right 07/14/2013   Procedure: ENDARTERECTOMY CAROTID;   Surgeon: Chuck Hint, MD;  Location: [facility] OR;  Service: Vascular;  Laterality: Right;   ENDOSCOPIC RETROGRADE CHOLANGIOPANCREATOGRAPHY (ERCP) WITH PROPOFOL N/A 02/06/2022   Procedure: ENDOSCOPIC RETROGRADE CHOLANGIOPANCREATOGRAPHY (ERCP) WITH PROPOFOL;  Surgeon: Lemar Lofty., MD;  Location: [facility] ENDOSCOPY;  Service: Gastroenterology;  Laterality: N/A;   EP IMPLANTABLE DEVICE N/A 08/12/2015   Procedure: Loop Recorder Insertion;  Surgeon: Hillis Range, MD;  [facility];  Service: Cardiovascular;  Laterality: N/A;   ESOPHAGOGASTRODUODENOSCOPY Left 02/13/2022   Procedure: ESOPHAGOGASTRODUODENOSCOPY (EGD);  Surgeon: Shellia Cleverly, DO;  Location: [facility] ENDOSCOPY;  Service: Gastroenterology;  Laterality: Left;   ESOPHAGOGASTRODUODENOSCOPY (EGD) WITH PROPOFOL N/A 06/25/2018   Procedure: ESOPHAGOGASTRODUODENOSCOPY (EGD) WITH PROPOFOL;  Surgeon: Meridee Score Netty Starring., MD;  Location: [facility] ENDOSCOPY;  Service: Gastroenterology;  Laterality: N/A;   ESOPHAGOGASTRODUODENOSCOPY (EGD) WITH PROPOFOL N/A 07/10/2018   Procedure: ESOPHAGOGASTRODUODENOSCOPY (EGD) WITH PROPOFOL;  Surgeon: Rachael Fee, MD;  [facility];  Service: Endoscopy;  Laterality: N/A;   EUS N/A  07/10/2018   Procedure: UPPER ENDOSCOPIC ULTRASOUND (EUS) RADIAL;  Surgeon: Rachael Fee, MD;  Location[facility]OS[facility];  Service: Endoscopy;  Laterality: N/A;   EYE SURGERY Right    FEMORAL-POPLITEAL BYPASS GRAFT Left 1994  [facility]   AND 2001  IN [place]   FEMORAL-POPLITEAL BYPASS GRAFT Left 08/30/2015   Procedure: REVISION OF BYPASS GRAFT Left  FEMORAL-POPLITEAL ARTERY;  Surgeon: Chuck Hint, MD;  Location: [facility] OR;  Service: Vascular;  Laterality: Left;   FINE NEEDLE ASPIRATION N/A 07/10/2018   Procedure: FINE NEEDLE ASPIRATION (FNA) LINEAR;  Surgeon: Rachael Fee, MD;  Location[facility];  Service: Endoscopy;  Laterality: N/A;   FLEXIBLE SIGMOIDOSCOPY N/A 12/12/2018   Procedure:  FLEXIBLE SIGMOIDOSCOPY;  Surgeon: Andria Meuse, MD;  [facility] ORS;  Service: General;  Laterality: N/A;   HEMOSTASIS CLIP PLACEMENT  02/13/2022   Procedure: HEMOSTASIS CLIP PLACEMENT;  Surgeon: Shellia Cleverly, DO;  [facility];  Service: Gastroenterology;;   HEMOSTASIS CONTROL  03/26/2023   Procedure: HEMOSTASIS CONTROL;  Surgeon: [name], MD;  Location: [facility] ENDOSCOPY;  Service: Pulmonary;;   HOT HEMOSTASIS N/A 02/13/2022   Procedure: HOT HEMOSTASIS (ARGON PLASMA COAGULATION/BICAP);  Surgeon: Shellia Cleverly, DO;  Location: [facility] ENDOSCOPY;  Service: Gastroenterology;  Laterality: N/A;   LARYNGOSCOPY  06-27-2004   BX VOCAL CORD  (LEUKOPLAKIA)  PER PT NO ISSUES SINCE   LEFT HEART CATH AND CORONARY ANGIOGRAPHY N/A 08/20/2017   Procedure: LEFT HEART CATH AND CORONARY ANGIOGRAPHY;  Surgeon: Laurey Morale, MD;  Location: [facility] INVASIVE CV LAB;  Service: Cardiovascular;  Laterality: N/A;   LOOP RECORDER INSERTION N/A 04/09/2019   Procedure: LOOP RECORDER INSERTION;  Surgeon: Hillis Range, MD;  [facility];  Service: Cardiovascular;  Laterality: N/A;   LOOP RECORDER REMOVAL N/A 04/09/2019   Procedure: LOOP RECORDER REMOVAL;  Surgeon: Hillis Range, MD;  [facility];  Service: Cardiovascular;  Laterality: N/A;   LOWER EXTREMITY ANGIOGRAM Bilateral 08/29/2015   Procedure: Lower Extremity Angiogram;  Surgeon: Chuck Hint, MD;  Location: [facility] INVASIVE CV LAB;  Service: Cardiovascular;  Laterality: Bilateral;   LUNG LOBECTOMY  01/24/2011    RIGHT UPPER LOBE  (SQUAMOUS CELL CARCINOMA) Dr Desmond Lope , [facility]. No chemotherapyor radiation   PANCREATIC STENT PLACEMENT  02/06/2022   Procedure: PANCREATIC STENT PLACEMENT;  Surgeon: Meridee Score Netty Starring., MD;  Location: [facility] ENDOSCOPY;  Service: Gastroenterology;;   [facility] ANGIOPLASTY Right 07/14/2013   Procedure: PATCH ANGIOPLASTY;  Surgeon: Chuck Hint, MD;  Location: [facility] OR;  Service:  Vascular;  Laterality: Right;   PATCH ANGIOPLASTY Left 08/30/2015   Procedure: VEIN PATCH ANGIOPLASTY OF PROXIMAL Left BYPASS GRAFT;  Surgeon: Chuck Hint, MD;  Location: [facility] OR;  Service: Vascular;  Laterality: Left;   PERIPHERAL VASCULAR CATHETERIZATION N/A 08/29/2015   Procedure: Abdominal Aortogram;  Surgeon: Chuck Hint, MD;  Location: [facility] INVASIVE CV LAB;  Service: Cardiovascular;  Laterality: N/A;   POLYPECTOMY  06/25/2018   Procedure: POLYPECTOMY;  Surgeon: Meridee Score Netty Starring., MD;  Location: [facility] ENDOSCOPY;  Service: Gastroenterology;;   POLYPECTOMY     POLYPECTOMY  02/13/2022   Procedure: POLYPECTOMY;  Surgeon: Shellia Cleverly, DO;  [facility];  Service: Gastroenterology;;   REMOVAL OF STONES  02/06/2022   Procedure: REMOVAL OF STONES;  Surgeon: Lemar Lofty., MD;  Location: [facility] ENDOSCOPY;  Service: Gastroenterology;;   Susa Day  02/13/2022   Procedure: Susa Day;  Surgeon: Shellia Cleverly, DO;  Location: [facility]  ENDOSCOPY;  Service: Gastroenterology;;   Dennison Mascot  02/06/2022   Procedure: Dennison Mascot;  Surgeon: Meridee Score Netty Starring., MD;  Location: [facility] ENDOSCOPY;  Service: Gastroenterology;;   Sunnie Nielsen INJECTION  06/25/2018   Procedure: SUBMUCOSAL INJECTION;  Surgeon: Lemar Lofty., MD;  Location: [facility] ENDOSCOPY;  Service: Gastroenterology;;   TEE WITHOUT CARDIOVERSION N/A 02/14/2018   Procedure: TRANSESOPHAGEAL ECHOCARDIOGRAM (TEE);  Surgeon: Laurey Morale, MD;  Location: [facility] ENDOSCOPY;  Service: Cardiovascular;  Laterality: N/A;   trabecular surgery     OS   TRANSTHORACIC ECHOCARDIOGRAM  12-29-2012  DR [facility]   MILD LVH/  LVSF NORMAL/ EF 55-60%/  GRADE I DIASTOLIC DYSFUNCTION   VIDEO BRONCHOSCOPY WITH RADIAL ENDOBRONCHIAL ULTRASOUND  03/26/2023   Procedure: VIDEO BRONCHOSCOPY WITH RADIAL ENDOBRONCHIAL ULTRASOUND;  Surgeon: [name], MD;  [facility]OS[facility];  Service: Pulmonary;;    Social History    Socioeconomic History   Marital status: Widowed    Spouse name: Westly Pam    Number of children: 3   Years of education: 12+   Highest education level: Not on file  Occupational History   Occupation: Retired    Comment: Owns a [employer], Sales executive, 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  Tobacco Use   Smoking status: Former    Current packs/day: 0.00    Average packs/day: 0.5 packs/day for 50.0 years (25.0 ttl pk-yrs)    Types: Cigarettes    Start date: 07/28/1964    Quit date: 07/28/2014    Years since quitting: 8.8    Passive exposure: Never   Smokeless tobacco: Never  Vaping Use   Vaping status: Never Used  Substance and Sexual Activity   Alcohol use: Yes    Alcohol/week: 4.0 standard drinks of alcohol    Types: 1 Glasses of wine, 1 Cans of beer, 1 Shots of liquor, 1 Standard drinks or equivalent per week    Comment: socially, variable; moderately heavy use in the past   Drug use: No   Sexual activity: Not Currently  Other Topics Concern   Not on file  Social History Narrative   Patient lives at home a lone has caregivers   Patient has 3 children    Patient is right handed    Social Determinants of Health   Financial Resource Strain: Low Risk  (10/05/2022)   Overall Financial Resource Strain (CARDIA)    Difficulty of Paying Living Expenses: Not hard at all  Food Insecurity: No Food Insecurity (04/08/2023)   Hunger Vital Sign    Worried About Running Out of Food in the Last Year: Never true    Ran Out of Food in the Last Year: Never true  Transportation Needs: No Transportation Needs (04/08/2023)   PRAPARE - Administrator, Civil Service (Medical): No    Lack of Transportation (Non-Medical): No  Physical Activity: Inactive (10/05/2022)   Exercise Vital Sign    Days of Exercise per Week: 0 days    Minutes of Exercise per Session: 0 min  Stress: No Stress Concern Present (10/05/2022)   [employer] of  Occupational Health - Occupational Stress Questionnaire    Feeling of Stress : Not at all  Social Connections: Socially Isolated (10/05/2022)   Social Connection and Isolation Panel [NHANES]    Frequency of Communication with Friends and Family: More than three times a week    Frequency of Social Gatherings with Friends and Family: Once a week    Attends Religious Services:  Never    Active Member of Clubs or Organizations: No    Attends Banker Meetings: Never    Marital Status: Widowed  Intimate Partner Violence: Not At Risk (04/08/2023)   Humiliation, Afraid, Rape, and Kick questionnaire    Fear of Current or Ex-Partner: No    Emotionally Abused: No    Physically Abused: No    Sexually Abused: No    Family History  Problem Relation Age of Onset   Stroke Mother        mini strokes   Alcohol abuse Father    Heart disease Father        MI after 26   Stroke Father    Hypertension Father    Heart attack Father    Heart disease Paternal Aunt        several   Hypertension Paternal Aunt        several   Stroke Paternal Aunt        several   Stroke Paternal Uncle        several   Heart disease Paternal Uncle        several;2 had MI pre 66   Cancer Daughter 60       breast ca, also with benign sessile polyp    Colon cancer Daughter 69   Colon polyps Neg Hx    Esophageal cancer Neg Hx    Rectal cancer Neg Hx    Stomach cancer Neg Hx     Current Outpatient Medications  Medication Sig Dispense Refill   acetaminophen (TYLENOL) 325 MG tablet Take 650 mg by mouth every 6 (six) hours as needed for moderate pain or headache.     albuterol (VENTOLIN HFA) 108 (90 Base) MCG/ACT inhaler Inhale 2 puffs into the lungs every 6 (six) hours as needed for wheezing or shortness of breath.     amLODipine (NORVASC) 10 MG tablet Take 10 mg by mouth daily.     apixaban (ELIQUIS) 2.5 MG TABS tablet Take 1 tablet (2.5 mg total) by mouth 2 (two) times daily. Okay to restart this medication  on 03/27/2023 60 tablet    Brinzolamide-Brimonidine (SIMBRINZA) 1-0.2 % SUSP Place 1 drop into both eyes 3 (three) times daily. 8a, 2p, 8p     Budeson-Glycopyrrol-Formoterol (BREZTRI AEROSPHERE) 160-9-4.8 MCG/ACT AERO Inhale 2 puffs into the lungs in the morning and at bedtime. 10.7 g 0   diclofenac Sodium (VOLTAREN) 1 % GEL Apply 2 g topically 4 (four) times daily.     docusate sodium (COLACE) 100 MG capsule Take 100 mg by mouth 2 (two) times daily as needed for moderate constipation.     escitalopram (LEXAPRO) 20 MG tablet Take 1 tablet (20 mg total) by mouth daily. 90 tablet 3   furosemide (LASIX) 20 MG tablet Take 80 mg by mouth daily.     JARDIANCE 10 MG TABS tablet TAKE ONE TABLET BY MOUTH EVERY DAY BEFORE BREAKFAST. 30 tablet 11   latanoprost (XALATAN) 0.005 % ophthalmic solution Place 1 drop into both eyes at bedtime.     Melatonin 10 MG TABS Take 20 mg by mouth at bedtime.     metoprolol succinate (TOPROL-XL) 25 MG 24 hr tablet Take 0.5 tablets (12.5 mg total) by mouth daily. 45 tablet 3   montelukast (SINGULAIR) 10 MG tablet TAKE ONE TABLET BY MOUTH AT BEDTIME 28 tablet 5   omeprazole (PRILOSEC OTC) 20 MG tablet Take 20 mg by mouth daily.     omeprazole (PRILOSEC) 20 MG  capsule Take 20 mg by mouth daily.     Polyethyl Glycol-Propyl Glycol (SYSTANE ULTRA) 0.4-0.3 % SOLN Place 1 drop into both eyes daily as needed (dry eyes).     potassium chloride SA (KLOR-CON M) 20 MEQ tablet TAKE TWO TABLETS BY MOUTH IN THE MORNING AND TAKE ONE TABLET IN THE EVENING (Patient taking differently: Take 40 mEq by mouth daily.) 90 tablet 6   PRESCRIPTION MEDICATION Pt uses CPAP machine at bedtime     rosuvastatin (CRESTOR) 40 MG tablet TAKE ONE TABLET BY MOUTH EVERY DAY 90 tablet 3   senna-docusate (SENOKOT-S) 8.6-50 MG tablet Take 1 tablet by mouth daily.     spironolactone (ALDACTONE) 25 MG tablet Take 25 mg by mouth daily.     No current facility-administered medications for this visit.    Allergies   Allergen Reactions   Niacin-Lovastatin Er Shortness Of Breath and Other (See Comments)    Dyspnea Flushing  *Not listed on MAR    Penicillins Hives, Shortness Of Breath and Other (See Comments)    Dyspnea Flushing Because of a history of documented adverse serious drug reaction;Medi Alert bracelet  is recommended PCN reaction causing immediate rash, facial/tongue/throat swelling, SOB or lightheadedness with hypotension: Yes PCN reaction causing severe rash involving mucus membranes or skin necrosis: No PCN reaction occurring within the last 10 years: NO PCN reaction that required hospitalization: NO   Sulfonamide Derivatives Hives, Shortness Of Breath and Other (See Comments)    Dyspnea  Flushing Because of a history of documented adverse serious drug reaction;Medi Alert bracelet  is recommended   Lipitor [Atorvastatin] Other (See Comments)    Myalgias Arthralgias   *Not listed on MAR      REVIEW OF SYSTEMS:   [X]  denotes positive finding, [ ]  denotes negative finding Cardiac  Comments:  Chest pain or chest pressure:    Shortness of breath upon exertion:    Short of breath when lying flat:    Irregular heart rhythm:        Vascular    Pain in calf, thigh, or hip brought on by ambulation:    Pain in feet at night that wakes you up from your sleep:     Blood clot in your veins:    Leg swelling:         Pulmonary    Oxygen at home:    Productive cough:     Wheezing:         Neurologic    Sudden weakness in arms or legs:     Sudden numbness in arms or legs:     Sudden onset of difficulty speaking or slurred speech:    Temporary loss of vision in one eye:     Problems with dizziness:         Gastrointestinal    Blood in stool:     Vomited blood:         Genitourinary    Burning when urinating:     Blood in urine:        Psychiatric    Major depression:         Hematologic    Bleeding problems:    Problems with blood clotting too easily:        Skin     Rashes or ulcers:        Constitutional    Fever or chills:      PHYSICAL EXAMINATION:  Vitals:   06/13/23 1222  BP: (!) 172/66  Pulse: 66  Resp: 18  Temp: 98.5 F (36.9 C)  TempSrc: Temporal  SpO2: 91%  Weight: 168 lb (76.2 kg)  Height: 6\' 1"  (1.854 m)    General:  WDWN in NAD; vital signs documented above Gait: Not observed HENT: WNL, normocephalic Pulmonary: normal non-labored breathing , without Rales, rhonchi,  wheezing Cardiac: regular HR Abdomen: soft, NT, no masses Skin: without rashes Extremities: without ischemic changes, without Gangrene , without cellulitis; without open wounds; brisk DP signals by Doppler bilaterally Musculoskeletal: no muscle wasting or atrophy  Neurologic: A&O X 3; cranial nerves grossly intact Psychiatric:  The pt has Normal affect.   Non-Invasive Vascular Imaging:   Largest AAA measurement 3.8 cm  Widely patent left lower extremity bypass; 37 cm/s peak systolic velocity at the distal anastomosis  ABI/TBIToday's ABIToday's TBIPrevious ABIPrevious TBI  +-------+-----------+-----------+------------+------------+  Right 0.51       0.46       0.58        0.46          +-------+-----------+-----------+------------+------------+  Left  0.97       0.99       0.98        1.04          +-------+-----------+-----------+------------+------------+      ASSESSMENT/PLAN:: 82 y.o. male here for follow up for surveillance of AAA and PAD including left lower extremity bypass graft   -[name] is an 82 year old male who currently resides in a nursing facility.  He is also minimally ambulatory with a walker.  He has not developed any rest pain or tissue loss.  Duplex demonstrates a widely patent left leg bypass graft.  ABIs and TBI's are unchanged bilaterally.  We will repeat left lower extremity bypass duplex and ABIs in 1 year.  He is also followed for surveillance of AAA.  In March of this year AAA measured 4.5 cm in  largest diameter.  Today her largest diameter measurement is 3.8 cm.  This does not represent a reversal of aneurysmal dilatation of his aorta rather likely a difference of ultrasonographer technique, angle, presence of bowel gas, etc.  We will repeat duplex in 1 year  We will also repeat his carotid duplex in 1 year   Emilie Rutter, PA-C [facility] [phone]  Clinic MD:   Randie Heinz

## 2023-06-19 ENCOUNTER — Other Ambulatory Visit: Payer: Self-pay

## 2023-06-19 DIAGNOSIS — I739 Peripheral vascular disease, unspecified: Secondary | ICD-10-CM

## 2023-06-19 DIAGNOSIS — I6523 Occlusion and stenosis of bilateral carotid arteries: Secondary | ICD-10-CM

## 2023-06-19 DIAGNOSIS — I714 Abdominal aortic aneurysm, without rupture, unspecified: Secondary | ICD-10-CM

## 2023-06-24 ENCOUNTER — Ambulatory Visit: Payer: PPO

## 2023-06-26 DIAGNOSIS — G4733 Obstructive sleep apnea (adult) (pediatric): Secondary | ICD-10-CM | POA: Diagnosis not present

## 2023-06-27 DIAGNOSIS — I480 Paroxysmal atrial fibrillation: Secondary | ICD-10-CM | POA: Diagnosis not present

## 2023-06-27 DIAGNOSIS — F3342 Major depressive disorder, recurrent, in full remission: Secondary | ICD-10-CM | POA: Diagnosis not present

## 2023-06-27 DIAGNOSIS — D6869 Other thrombophilia: Secondary | ICD-10-CM | POA: Diagnosis not present

## 2023-06-27 DIAGNOSIS — I5032 Chronic diastolic (congestive) heart failure: Secondary | ICD-10-CM | POA: Diagnosis not present

## 2023-06-27 DIAGNOSIS — C3411 Malignant neoplasm of upper lobe, right bronchus or lung: Secondary | ICD-10-CM | POA: Diagnosis not present

## 2023-06-27 DIAGNOSIS — G4733 Obstructive sleep apnea (adult) (pediatric): Secondary | ICD-10-CM | POA: Diagnosis not present

## 2023-06-27 DIAGNOSIS — J449 Chronic obstructive pulmonary disease, unspecified: Secondary | ICD-10-CM | POA: Diagnosis not present

## 2023-06-27 DIAGNOSIS — E261 Secondary hyperaldosteronism: Secondary | ICD-10-CM | POA: Diagnosis not present

## 2023-06-27 DIAGNOSIS — Z515 Encounter for palliative care: Secondary | ICD-10-CM | POA: Diagnosis not present

## 2023-06-27 DIAGNOSIS — D692 Other nonthrombocytopenic purpura: Secondary | ICD-10-CM | POA: Diagnosis not present

## 2023-06-30 DIAGNOSIS — K219 Gastro-esophageal reflux disease without esophagitis: Secondary | ICD-10-CM | POA: Diagnosis not present

## 2023-06-30 DIAGNOSIS — R2681 Unsteadiness on feet: Secondary | ICD-10-CM | POA: Diagnosis not present

## 2023-06-30 DIAGNOSIS — R531 Weakness: Secondary | ICD-10-CM | POA: Diagnosis not present

## 2023-06-30 DIAGNOSIS — I509 Heart failure, unspecified: Secondary | ICD-10-CM | POA: Diagnosis not present

## 2023-06-30 DIAGNOSIS — J449 Chronic obstructive pulmonary disease, unspecified: Secondary | ICD-10-CM | POA: Diagnosis not present

## 2023-07-02 ENCOUNTER — Telehealth: Payer: Self-pay | Admitting: Pulmonary Disease

## 2023-07-02 NOTE — Telephone Encounter (Signed)
New message   The patient's appointment with Dr. Tonia Brooms is scheduled for Friday, July 05, 2023.  Call and spoke with patient's daughter, Wilkie Aye.  1. A virtual appointment with Dr. Tonia Brooms on August 16, 2023. 2. An appointment with an Advanced Practice Provider. 3. The next available appointment with Dr. Tonia Brooms is in January 2025.  Wilkie Aye has emphasized the urgency of her father's situation, expressing that he needs to be seen due to shortness of breath

## 2023-07-05 ENCOUNTER — Ambulatory Visit: Payer: PPO | Admitting: Pulmonary Disease

## 2023-07-08 NOTE — Telephone Encounter (Signed)
Appointment scheduled.

## 2023-07-18 ENCOUNTER — Encounter: Payer: Self-pay | Admitting: Pulmonary Disease

## 2023-07-18 ENCOUNTER — Ambulatory Visit: Payer: PPO | Admitting: Pulmonary Disease

## 2023-07-18 VITALS — BP 124/58 | HR 53 | Temp 98.2°F | Ht 73.0 in | Wt 168.2 lb

## 2023-07-18 DIAGNOSIS — C3490 Malignant neoplasm of unspecified part of unspecified bronchus or lung: Secondary | ICD-10-CM | POA: Diagnosis not present

## 2023-07-18 DIAGNOSIS — Z85038 Personal history of other malignant neoplasm of large intestine: Secondary | ICD-10-CM | POA: Diagnosis not present

## 2023-07-18 DIAGNOSIS — Z9889 Other specified postprocedural states: Secondary | ICD-10-CM | POA: Diagnosis not present

## 2023-07-18 DIAGNOSIS — Z85118 Personal history of other malignant neoplasm of bronchus and lung: Secondary | ICD-10-CM

## 2023-07-18 DIAGNOSIS — Z87891 Personal history of nicotine dependence: Secondary | ICD-10-CM | POA: Diagnosis not present

## 2023-07-18 NOTE — Progress Notes (Signed)
Synopsis: Referred in June 2022 for abnormal CT chest, PCP: By Pincus Sanes, MD  Subjective:   PATIENT ID: Troy Bruce [name] GENDER: male DOB: [birth year], MRN: [id number]  Chief Complaint  Patient presents with   Follow-up    SOB with exertion.  Some cough.    Is an 82 year old gentleman, past medical history of atrial fibrillation, AAA, BPH, chronic diastolic heart failure, emphysema, history of lung cancer, April 2012, squamous cell carcinoma status post right lower lobectomy at [facility], no chemo no radiation no recurrence, hyperlipidemia, hypertension.  Patient has no respiratory complaints today.  He is anxious about this slow-growing lesion that has been followed for some time.  He has several within the right chest, none within the left chest.  Nuclear medicine pet imaging on 02/08/2021 completed revealing a bandlike hypermetabolic nodules within the inferior aspect of the right upper lobe that have slowly increased over time this nodularity area concerning for malignancy as well as a hypermetabolic nodule just inferior to the SVC within the right upper mediastinum concerning for malignancy as well as a small nodule 13 mm in size with low-level metabolic uptake SUV of 1.4 less concerning for malignancy.  However due to his history of lung cancer status post lobectomy and colon cancer they have been following these areas closely and medical oncology.  OV 05/25/2021: Here today for follow-up regarding lung nodules.  Has no respiratory complaints today.  Had CT scan in June.  Here today for follow-up.  Lung nodule appears stable in comparison to his previous imaging.  OV 07/27/2021: Here today for follow-up after recent hospitalization.  Patient brought into the hospital found to have pleural effusion.  Had thoracentesis completed at that time which was negative for malignant cells did have lymphocyte predominance.  From respiratory standpoint he is doing okay.  Had repeat CT imaging at the  time also showed dissipation of the nodules that we were following in the upper lobes.  He was also discharged from the hospital on multiple inhalers and would like clarification which one he wants to be on.  OV 10/13/2021: Here today for follow-up after recent noncontrasted CT imaging of the chest for lung nodules.  Patient has a right-sided lower lobe area that looks stable.  Reviewed CT results today in the office.  Persistent right-sided effusion appears stable.  OV 07/26/2022: Here today for follow-up after recent CT scan of the chest.  Recent CT completed in October 2023 reviewed images today in the office this shows persistence of the right upper lobe band like retraction and rounded nodular density.  This looks stable to me.  There is been slow progression over time.  Unable to exclude malignancy.  Otherwise patient is doing well.  Still has some issues with eyesight has glaucoma currently managed by ophthalmology.  OV 03/07/2023: Here today for follow-up after repeat CT imaging.  It shows progression of the pulmonary nodule on the fissure line in the right upper lobe.  Also shows that there is concern for increased area and scarring which may also be recurrence of malignancy or metastatic disease.  Case discussed with medical oncology.  Would prefer to have repeat biopsy before consideration of treatment.  Has a history of lung cancer status post right lower lobectomy at [facility] in 2012.  OV 07/18/2023: Here today for follow-up.  He has not had his repeat imaging it scheduled for next month.  Here today for follow-up.  He nor I are sure why he is here today.  He has no respiratory complaints.  Overall doing well he states his chronic cough is normal for him.  He has repeat imaging and follow-up with medical oncology already established.  They decided that there cannot wait to see if there is any progression on the lesion before consideration for radiation treatments after discussion with radiation  oncology.     Past Medical History:  Diagnosis Date   AAA (abdominal aortic aneurysm) (HCC) 10-20-17   MONITORED BY DR Edilia Bo, 3.7 CM by ABDOMINAL US 11/2021   Anxiety    Arthritis    Atrial fibrillation (HCC)    Basal cell carcinoma    BPH (benign prostatic hypertrophy)    Chronic diastolic heart failure (HCC)    Chronic kidney disease    stage 3, pt unaware   Colon cancer (HCC)    COPD (chronic obstructive pulmonary disease) (HCC)    Depression    GERD (gastroesophageal reflux disease)    Glaucoma BOTH EYES   Dr Nile Riggs   History of lung cancer APRIL 2012   ONCOLOGIST- DR Mariel Sleet  LOV IN [facility] 10-27-2012 SQUAMOUS CELL---- S/P RIGHT LOWER LOBECTOMY AT [facility] -- NO CHEMORADIATION--- NO RECURRENCE   Hx of adenomatous colonic polyps 2005    X 2; 1 hyperplastic polyp; Dr Juanda Chance   Hyperlipidemia    Hypertension    OSA (obstructive sleep apnea) 08/29/2015   CPAP SET ON 10   Peripheral vascular disease (HCC)    FOLLOWED  BY DR DICKSON   Spinal stenosis 06/2014   Thoracic aorta atherosclerosis (HCC)    Thyrotoxicosis    amiodarone induced     Family History  Problem Relation Age of Onset   Stroke Mother        mini strokes   Alcohol abuse Father    Heart disease Father        MI after 67   Stroke Father    Hypertension Father    Heart attack Father    Heart disease Paternal Aunt        several   Hypertension Paternal Aunt        several   Stroke Paternal Aunt        several   Stroke Paternal Uncle        several   Heart disease Paternal Uncle        several;2 had MI pre 40   Cancer Daughter 26       breast ca, also with benign sessile polyp    Colon cancer Daughter 13   Colon polyps Neg Hx    Esophageal cancer Neg Hx    Rectal cancer Neg Hx    Stomach cancer Neg Hx      Past Surgical History:  Procedure Laterality Date   ABDOMINAL AORTOGRAM W/LOWER EXTREMITY N/A 12/20/2022   Procedure: ABDOMINAL AORTOGRAM W/LOWER EXTREMITY;  Surgeon: Cephus Shelling,  MD;  [facility];  Service: Cardiovascular;  Laterality: N/A;   ANGIO PLASTY     X 4 in legs   AORTOGRAM  07-27-2002   MILD DIFFUSE ILIAC ARTERY OCCLUSIVE DISEASE /  LEFT RENAL ARTERY 20%/ PATENT LEFT FEM-POP GRAFT/ MILD SFA AND POPLITEAL ARTERY OCCLUSIVE DISEASE W/ SEVERE KIDNEY OCCLUSIVE DISEASE   ATRIAL FIBRILLATION ABLATION N/A 04/09/2019   Procedure: ATRIAL FIBRILLATION ABLATION;  Surgeon: Hillis Range, MD;  [facility];  Service: Cardiovascular;  Laterality: N/A;   ATRIAL FIBRILLATION ABLATION N/A 02/12/2020   Procedure: ATRIAL FIBRILLATION ABLATION;  Surgeon: Hillis Range, MD;  [facility];  Service: Cardiovascular;  Laterality: N/A;   BALLOON DILATION N/A 02/06/2022   Procedure: BALLOON Tamponade ampulla;  Surgeon: Lemar Lofty., MD;  Location: [facility] ENDOSCOPY;  Service: Gastroenterology;  Laterality: N/A;   BASAL CELL CARCINOMA EXCISION     MULTIPLE TIMES--  RIGHT FOREARM, CHEEKS, AND BACK   BIOPSY  06/25/2018   Procedure: BIOPSY;  Surgeon: Meridee Score Netty Starring., MD;  Location: [facility] ENDOSCOPY;  Service: Gastroenterology;;   BIOPSY  02/06/2022   Procedure: BIOPSY;  Surgeon: Lemar Lofty., MD;  Location: [facility] ENDOSCOPY;  Service: Gastroenterology;;   BRONCHIAL BIOPSY  03/26/2023   Procedure: BRONCHIAL BIOPSIES;  Surgeon: [name], MD;  Location: [facility] ENDOSCOPY;  Service: Pulmonary;;   BRONCHIAL BRUSHINGS  03/26/2023   Procedure: BRONCHIAL BRUSHINGS;  Surgeon: [name], MD;  Location: [facility] ENDOSCOPY;  Service: Pulmonary;;   BRONCHIAL NEEDLE ASPIRATION BIOPSY  03/26/2023   Procedure: BRONCHIAL NEEDLE ASPIRATION BIOPSIES;  Surgeon: [name], MD;  Location: [facility] ENDOSCOPY;  Service: Pulmonary;;   BRONCHIAL WASHINGS  03/26/2023   Procedure: BRONCHIAL WASHINGS;  Surgeon: [name], MD;  [facility];  Service: Pulmonary;;   CARDIOVASCULAR STRESS TEST  12-08-2012  DR MCLEAN   NORMAL LEXISCAN WITH NO  EXERCISE NUCLEAR STUDY/ EF 66%/   NO ISCHEMIA/ NO SIGNIFICANT CHANGE FROM PRIOR STUDY   CARDIOVERSION N/A 02/14/2018   Procedure: CARDIOVERSION;  Surgeon: Laurey Morale, MD;  Location: [facility] ENDOSCOPY;  Service: Cardiovascular;  Laterality: N/A;   CARDIOVERSION N/A 07/10/2022   Procedure: CARDIOVERSION;  Surgeon: Laurey Morale, MD;  Location: [facility] ENDOSCOPY;  Service: Cardiovascular;  Laterality: N/A;   CAROTID ANGIOGRAM N/A 07/10/2013   Procedure: CAROTID ANGIOGRAM;  Surgeon: Sherren Kerns, MD;  Location: [facility] CATH LAB;  Service: Cardiovascular;  Laterality: N/A;   CAROTID ENDARTERECTOMY Right 07-14-13   cea   CATARACT EXTRACTION W/ INTRAOCULAR LENS  IMPLANT, BILATERAL     colon polyectomy     COLONOSCOPY     COLONOSCOPY WITH PROPOFOL N/A 06/25/2018   Procedure: COLONOSCOPY WITH PROPOFOL;  Surgeon: Meridee Score Netty Starring., MD;  Location: [facility] ENDOSCOPY;  Service: Gastroenterology;  Laterality: N/A;   CYSTOSCOPY W/ RETROGRADES Bilateral 01/21/2013   Procedure: CYSTOSCOPY WITH RETROGRADE PYELOGRAM;  Surgeon: Milford Cage, MD;  Location[facility];  Service: Urology;  Laterality: Bilateral;   CYSTO, BLADDER BIOPSY, BILATERAL RETROGRADE PYELOGRAM  RAD TECH FROM RADIOLOGY PER JOY   CYSTOSCOPY WITH BIOPSY N/A 01/21/2013   Procedure: CYSTOSCOPY WITH BIOPSY;  Surgeon: Milford Cage, MD;  Location: [facility];  Service: Urology;  Laterality: N/A;   ENDARTERECTOMY Right 07/14/2013   Procedure: ENDARTERECTOMY CAROTID;  Surgeon: Chuck Hint, MD;  Location: [facility] OR;  Service: Vascular;  Laterality: Right;   ENDOSCOPIC RETROGRADE CHOLANGIOPANCREATOGRAPHY (ERCP) WITH PROPOFOL N/A 02/06/2022   Procedure: ENDOSCOPIC RETROGRADE CHOLANGIOPANCREATOGRAPHY (ERCP) WITH PROPOFOL;  Surgeon: Lemar Lofty., MD;  Location: [facility] ENDOSCOPY;  Service: Gastroenterology;  Laterality: N/A;   EP IMPLANTABLE DEVICE N/A 08/12/2015   Procedure: Loop Recorder Insertion;   Surgeon: Hillis Range, MD;  [facility];  Service: Cardiovascular;  Laterality: N/A;   ESOPHAGOGASTRODUODENOSCOPY Left 02/13/2022   Procedure: ESOPHAGOGASTRODUODENOSCOPY (EGD);  Surgeon: Shellia Cleverly, DO;  Location: [facility] ENDOSCOPY;  Service: Gastroenterology;  Laterality: Left;   ESOPHAGOGASTRODUODENOSCOPY (EGD) WITH PROPOFOL N/A 06/25/2018   Procedure: ESOPHAGOGASTRODUODENOSCOPY (EGD) WITH PROPOFOL;  Surgeon: Meridee Score Netty Starring., MD;  Location: [facility] ENDOSCOPY;  Service: Gastroenterology;  Laterality: N/A;  ESOPHAGOGASTRODUODENOSCOPY (EGD) WITH PROPOFOL N/A 07/10/2018   Procedure: ESOPHAGOGASTRODUODENOSCOPY (EGD) WITH PROPOFOL;  Surgeon: Rachael Fee, MD;  Location: [facility] ENDOSCOPY;  Service: Endoscopy;  Laterality: N/A;   EUS N/A 07/10/2018   Procedure: UPPER ENDOSCOPIC ULTRASOUND (EUS) RADIAL;  Surgeon: Rachael Fee, MD;  Location: [facility] ENDOSCOPY;  Service: Endoscopy;  Laterality: N/A;   EYE SURGERY Right    FEMORAL-POPLITEAL BYPASS GRAFT Left 1994  [facility]   AND 2001  IN [place]   FEMORAL-POPLITEAL BYPASS GRAFT Left 08/30/2015   Procedure: REVISION OF BYPASS GRAFT Left  FEMORAL-POPLITEAL ARTERY;  Surgeon: Chuck Hint, MD;  Location: [facility] OR;  Service: Vascular;  Laterality: Left;   FINE NEEDLE ASPIRATION N/A 07/10/2018   Procedure: FINE NEEDLE ASPIRATION (FNA) LINEAR;  Surgeon: Rachael Fee, MD;  Location: [facility] ENDOSCOPY;  Service: Endoscopy;  Laterality: N/A;   FLEXIBLE SIGMOIDOSCOPY N/A 12/12/2018   Procedure: FLEXIBLE SIGMOIDOSCOPY;  Surgeon: Andria Meuse, MD;  Location: [facility] ORS;  Service: General;  Laterality: N/A;   HEMOSTASIS CLIP PLACEMENT  02/13/2022   Procedure: HEMOSTASIS CLIP PLACEMENT;  Surgeon: Shellia Cleverly, DO;  Location[facility] ENDOS[facility];  Service: Gastroenterology;;   HEMOSTASIS CONTROL  03/26/2023   Procedure: HEMOSTASIS CONTROL;  Surgeon: [name], MD;  Location: [facility] ENDOSCOPY;  Service: Pulmonary;;   HOT HEMOSTASIS N/A  02/13/2022   Procedure: HOT HEMOSTASIS (ARGON PLASMA COAGULATION/BICAP);  Surgeon: Shellia Cleverly, DO;  Location: [facility] ENDOSCOPY;  Service: Gastroenterology;  Laterality: N/A;   LARYNGOSCOPY  06-27-2004   BX VOCAL CORD  (LEUKOPLAKIA)  PER PT NO ISSUES SINCE   LEFT HEART CATH AND CORONARY ANGIOGRAPHY N/A 08/20/2017   Procedure: LEFT HEART CATH AND CORONARY ANGIOGRAPHY;  Surgeon: Laurey Morale, MD;  Location: [facility] INVASIVE CV LAB;  Service: Cardiovascular;  Laterality: N/A;   LOOP RECORDER INSERTION N/A 04/09/2019   Procedure: LOOP RECORDER INSERTION;  Surgeon: Hillis Range, MD;  [facility];  Service: Cardiovascular;  Laterality: N/A;   LOOP RECORDER REMOVAL N/A 04/09/2019   Procedure: LOOP RECORDER REMOVAL;  Surgeon: Hillis Range, MD;  [facility];  Service: Cardiovascular;  Laterality: N/A;   LOWER EXTREMITY ANGIOGRAM Bilateral 08/29/2015   Procedure: Lower Extremity Angiogram;  Surgeon: Chuck Hint, MD;  Location: [facility] INVASIVE CV LAB;  Service: Cardiovascular;  Laterality: Bilateral;   LUNG LOBECTOMY  01/24/2011    RIGHT UPPER LOBE  (SQUAMOUS CELL CARCINOMA) Dr Desmond Lope , [facility]. No chemotherapyor radiation   PANCREATIC STENT PLACEMENT  02/06/2022   Procedure: PANCREATIC STENT PLACEMENT;  Surgeon: Meridee Score Netty Starring., MD;  Location: [facility] ENDOSCOPY;  Service: Gastroenterology;;   [facility] ANGIOPLASTY Right 07/14/2013   Procedure: PATCH ANGIOPLASTY;  Surgeon: Chuck Hint, MD;  Location: [facility] OR;  Service: Vascular;  Laterality: Right;   PATCH ANGIOPLASTY Left 08/30/2015   Procedure: VEIN PATCH ANGIOPLASTY OF PROXIMAL Left BYPASS GRAFT;  Surgeon: Chuck Hint, MD;  Location: [facility] OR;  Service: Vascular;  Laterality: Left;   PERIPHERAL VASCULAR CATHETERIZATION N/A 08/29/2015   Procedure: Abdominal Aortogram;  Surgeon: Chuck Hint, MD;  Location: [facility] INVASIVE CV LAB;  Service: Cardiovascular;  Laterality: N/A;   POLYPECTOMY  06/25/2018    Procedure: POLYPECTOMY;  Surgeon: Meridee Score Netty Starring., MD;  Location: [facility] ENDOSCOPY;  Service: Gastroenterology;;   POLYPECTOMY     POLYPECTOMY  02/13/2022   Procedure: POLYPECTOMY;  Surgeon: Shellia Cleverly, DO;  Location[facility] ENDOSCOPY;  Service: Gastroenterology;;   REMOVAL OF STONES  02/06/2022   Procedure: REMOVAL  OF STONES;  Surgeon: Meridee Score Netty Starring., MD;  Location: [facility] ENDOSCOPY;  Service: Gastroenterology;;   Susa Day  02/13/2022   Procedure: Susa Day;  Surgeon: Shellia Cleverly, DO;  Location: [facility] ENDOSCOPY;  Service: Gastroenterology;;   Dennison Mascot  02/06/2022   Procedure: Dennison Mascot;  Surgeon: Lemar Lofty., MD;  Location: [facility] ENDOSCOPY;  Service: Gastroenterology;;   SUBMUCOSAL INJECTION  06/25/2018   Procedure: SUBMUCOSAL INJECTION;  Surgeon: Lemar Lofty., MD;  Location: [facility] ENDOSCOPY;  Service: Gastroenterology;;   TEE WITHOUT CARDIOVERSION N/A 02/14/2018   Procedure: TRANSESOPHAGEAL ECHOCARDIOGRAM (TEE);  Surgeon: Laurey Morale, MD;  Location: [facility] ENDOSCOPY;  Service: Cardiovascular;  Laterality: N/A;   trabecular surgery     OS   TRANSTHORACIC ECHOCARDIOGRAM  12-29-2012  [facility]   MILD LVH/  LVSF NORMAL/ EF 55-60%/  GRADE I DIASTOLIC DYSFUNCTION   VIDEO BRONCHOSCOPY WITH RADIAL ENDOBRONCHIAL ULTRASOUND  03/26/2023   Procedure: VIDEO BRONCHOSCOPY WITH RADIAL ENDOBRONCHIAL ULTRASOUND;  Surgeon: [name], MD;  Location: [facility] ENDOSCOPY;  Service: Pulmonary;;    Social History   Socioeconomic History   Marital status: Widowed    Spouse name: Westly Pam    Number of children: 3   Years of education: 12+   Highest education level: Not on file  Occupational History   Occupation: Retired    Comment: Owns a [employer], Sales executive, 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  Tobacco Use   Smoking status: Former    Current packs/day: 0.00    Average packs/day: 0.5 packs/day for 50.0  years (25.0 ttl pk-yrs)    Types: Cigarettes    Start date: 07/28/1964    Quit date: 07/28/2014    Years since quitting: 8.9    Passive exposure: Never   Smokeless tobacco: Never  Vaping Use   Vaping status: Never Used  Substance and Sexual Activity   Alcohol use: Yes    Alcohol/week: 4.0 standard drinks of alcohol    Types: 1 Glasses of wine, 1 Cans of beer, 1 Shots of liquor, 1 Standard drinks or equivalent per week    Comment: socially, variable; moderately heavy use in the past   Drug use: No   Sexual activity: Not Currently  Other Topics Concern   Not on file  Social History Narrative   Patient lives at home a lone has caregivers   Patient has 3 children    Patient is right handed    Social Determinants of Health   Financial Resource Strain: Low Risk  (10/05/2022)   Overall Financial Resource Strain (CARDIA)    Difficulty of Paying Living Expenses: Not hard at all  Food Insecurity: No Food Insecurity (04/08/2023)   Hunger Vital Sign    Worried About Running Out of Food in the Last Year: Never true    Ran Out of Food in the Last Year: Never true  Transportation Needs: No Transportation Needs (04/08/2023)   PRAPARE - Administrator, Civil Service (Medical): No    Lack of Transportation (Non-Medical): No  Physical Activity: Inactive (10/05/2022)   Exercise Vital Sign    Days of Exercise per Week: 0 days    Minutes of Exercise per Session: 0 min  Stress: No Stress Concern Present (10/05/2022)   [employer] of Occupational Health - Occupational Stress Questionnaire    Feeling of Stress : Not at all  Social Connections: Socially Isolated (10/05/2022)   Social Connection and Isolation Panel [NHANES]    Frequency  of Communication with Friends and Family: More than three times a week    Frequency of Social Gatherings with Friends and Family: Once a week    Attends Religious Services: Never    Database administrator or Organizations: No    Attends Banker  Meetings: Never    Marital Status: Widowed  Intimate Partner Violence: Not At Risk (04/08/2023)   Humiliation, Afraid, Rape, and Kick questionnaire    Fear of Current or Ex-Partner: No    Emotionally Abused: No    Physically Abused: No    Sexually Abused: No     Allergies  Allergen Reactions   Niacin-Lovastatin Er Shortness Of Breath and Other (See Comments)    Dyspnea Flushing  *Not listed on MAR    Penicillins Hives, Shortness Of Breath and Other (See Comments)    Dyspnea Flushing Because of a history of documented adverse serious drug reaction;Medi Alert bracelet  is recommended PCN reaction causing immediate rash, facial/tongue/throat swelling, SOB or lightheadedness with hypotension: Yes PCN reaction causing severe rash involving mucus membranes or skin necrosis: No PCN reaction occurring within the last 10 years: NO PCN reaction that required hospitalization: NO   Sulfonamide Derivatives Hives, Shortness Of Breath and Other (See Comments)    Dyspnea  Flushing Because of a history of documented adverse serious drug reaction;Medi Alert bracelet  is recommended   Lipitor [Atorvastatin] Other (See Comments)    Myalgias Arthralgias   *Not listed on MAR      Outpatient Medications Prior to Visit  Medication Sig Dispense Refill   acetaminophen (TYLENOL) 325 MG tablet Take 650 mg by mouth every 6 (six) hours as needed for moderate pain or headache.     albuterol (VENTOLIN HFA) 108 (90 Base) MCG/ACT inhaler Inhale 2 puffs into the lungs every 6 (six) hours as needed for wheezing or shortness of breath.     amLODipine (NORVASC) 10 MG tablet Take 10 mg by mouth daily.     apixaban (ELIQUIS) 2.5 MG TABS tablet Take 1 tablet (2.5 mg total) by mouth 2 (two) times daily. Okay to restart this medication on 03/27/2023 60 tablet    Brinzolamide-Brimonidine (SIMBRINZA) 1-0.2 % SUSP Place 1 drop into both eyes 3 (three) times daily. 8a, 2p, 8p     Budeson-Glycopyrrol-Formoterol (BREZTRI  AEROSPHERE) 160-9-4.8 MCG/ACT AERO Inhale 2 puffs into the lungs in the morning and at bedtime. 10.7 g 0   diclofenac Sodium (VOLTAREN) 1 % GEL Apply 2 g topically 4 (four) times daily.     docusate sodium (COLACE) 100 MG capsule Take 100 mg by mouth 2 (two) times daily as needed for moderate constipation.     escitalopram (LEXAPRO) 20 MG tablet Take 1 tablet (20 mg total) by mouth daily. 90 tablet 3   furosemide (LASIX) 20 MG tablet Take 80 mg by mouth daily.     JARDIANCE 10 MG TABS tablet TAKE ONE TABLET BY MOUTH EVERY DAY BEFORE BREAKFAST. 30 tablet 11   latanoprost (XALATAN) 0.005 % ophthalmic solution Place 1 drop into both eyes at bedtime.     Melatonin 10 MG TABS Take 20 mg by mouth at bedtime.     metoprolol succinate (TOPROL-XL) 25 MG 24 hr tablet Take 0.5 tablets (12.5 mg total) by mouth daily. 45 tablet 3   montelukast (SINGULAIR) 10 MG tablet TAKE ONE TABLET BY MOUTH AT BEDTIME 28 tablet 5   omeprazole (PRILOSEC OTC) 20 MG tablet Take 20 mg by mouth daily.  omeprazole (PRILOSEC) 20 MG capsule Take 20 mg by mouth daily.     Polyethyl Glycol-Propyl Glycol (SYSTANE ULTRA) 0.4-0.3 % SOLN Place 1 drop into both eyes daily as needed (dry eyes).     potassium chloride SA (KLOR-CON M) 20 MEQ tablet TAKE TWO TABLETS BY MOUTH IN THE MORNING AND TAKE ONE TABLET IN THE EVENING (Patient taking differently: Take 40 mEq by mouth daily.) 90 tablet 6   PRESCRIPTION MEDICATION Pt uses CPAP machine at bedtime     rosuvastatin (CRESTOR) 40 MG tablet TAKE ONE TABLET BY MOUTH EVERY DAY 90 tablet 3   senna-docusate (SENOKOT-S) 8.6-50 MG tablet Take 1 tablet by mouth daily.     spironolactone (ALDACTONE) 25 MG tablet Take 25 mg by mouth daily.     No facility-administered medications prior to visit.    Review of Systems  Constitutional:  Negative for chills, fever, malaise/fatigue and weight loss.  HENT:  Negative for hearing loss, sore throat and tinnitus.   Eyes:  Negative for blurred vision and  double vision.  Respiratory:  Positive for cough and shortness of breath. Negative for hemoptysis, sputum production, wheezing and stridor.   Cardiovascular:  Negative for chest pain, palpitations, orthopnea, leg swelling and PND.  Gastrointestinal:  Negative for abdominal pain, constipation, diarrhea, heartburn, nausea and vomiting.  Genitourinary:  Negative for dysuria, hematuria and urgency.  Musculoskeletal:  Negative for joint pain and myalgias.  Skin:  Negative for itching and rash.  Neurological:  Negative for dizziness, tingling, weakness and headaches.  Endo/Heme/Allergies:  Negative for environmental allergies. Does not bruise/bleed easily.  Psychiatric/Behavioral:  Negative for depression. The patient is not nervous/anxious and does not have insomnia.   All other systems reviewed and are negative.    Objective:  Physical Exam Vitals reviewed.  Constitutional:      General: He is not in acute distress.    Appearance: He is well-developed.  HENT:     Head: Normocephalic and atraumatic.  Eyes:     General: No scleral icterus.    Conjunctiva/sclera: Conjunctivae normal.     Pupils: Pupils are equal, round, and reactive to light.  Neck:     Vascular: No JVD.     Trachea: No tracheal deviation.  Cardiovascular:     Rate and Rhythm: Normal rate and regular rhythm.     Heart sounds: Normal heart sounds. No murmur heard. Pulmonary:     Effort: Pulmonary effort is normal. No tachypnea, accessory muscle usage or respiratory distress.     Breath sounds: No stridor. No wheezing, rhonchi or rales.     Comments: Diminished breath sounds bilaterally Abdominal:     General: Bowel sounds are normal. There is no distension.     Palpations: Abdomen is soft.     Tenderness: There is no abdominal tenderness.  Musculoskeletal:        General: No tenderness.     Cervical back: Neck supple.  Lymphadenopathy:     Cervical: No cervical adenopathy.  Skin:    General: Skin is warm and  dry.     Capillary Refill: Capillary refill takes less than 2 seconds.     Findings: No rash.  Neurological:     Mental Status: He is alert and oriented to person, place, and time.  Psychiatric:        Behavior: Behavior normal.      Vitals:   07/18/23 1120  BP: (!) 124/58  Pulse: (!) 53  Temp: 98.2 F (36.8 C)  TempSrc: Oral  SpO2: 94%  Weight: 168 lb 3.2 oz (76.3 kg)  Height: 6\' 1"  (1.854 m)   94% on RA BMI Readings from Last 3 Encounters:  07/18/23 22.19 kg/m  06/13/23 22.16 kg/m  06/07/23 22.16 kg/m   Wt Readings from Last 3 Encounters:  07/18/23 168 lb 3.2 oz (76.3 kg)  06/13/23 168 lb (76.2 kg)  06/07/23 168 lb (76.2 kg)     CBC    Component Value Date/Time   WBC 4.5 04/30/2023 1557   RBC 4.47 04/30/2023 1557   HGB 12.7 (L) 04/30/2023 1557   HGB 14.3 02/09/2021 1136   HGB 12.4 (L) 05/16/2020 1615   HGB 11.8 (L) 03/07/2016 1213   HCT 40.0 04/30/2023 1557   HCT 37.9 05/16/2020 1615   HCT 36.3 (L) 03/07/2016 1213   PLT 282 04/30/2023 1557   PLT 248 02/09/2021 1136   PLT 329 05/16/2020 1615   MCV 89.5 04/30/2023 1557   MCV 94 05/16/2020 1615   MCV 85.0 03/07/2016 1213   MCH 28.4 04/30/2023 1557   MCHC 31.8 04/30/2023 1557   RDW 15.4 04/30/2023 1557   RDW 14.2 05/16/2020 1615   RDW 14.7 (H) 03/07/2016 1213   LYMPHSABS 0.3 (L) 08/26/2022 0215   LYMPHSABS 0.9 05/16/2020 1615   LYMPHSABS 0.8 (L) 03/07/2016 1213   MONOABS 0.3 08/26/2022 0215   MONOABS 0.8 03/07/2016 1213   EOSABS 0.0 08/26/2022 0215   EOSABS 0.1 05/16/2020 1615   BASOSABS 0.0 08/26/2022 0215   BASOSABS 0.0 05/16/2020 1615   BASOSABS 0.0 03/07/2016 1213    Chest Imaging: 02/08/2021: Nuclear medicine pet imaging Hypermetabolic bandlike nodules within the right upper lobe along the fissure.  A smaller 13 mm nodule anterior right upper lobe.  Also has a right superior mediastinal node just adjacent to the SVC. All have been followed for some time on CT imaging concern for potential  malignancy. The patient's images have been independently reviewed by me.    03/29/2021: CT scan of the chest: Stable right upper lobe pulmonary nodule. The patient's images have been independently reviewed by me.   CT chest 06/30/2021: Loculated effusion, stable scattered nodular changes Slightly enlarged left paratracheal lymph node. The patient's images have been independently reviewed by me.    CT chest May 2024: Right upper lobe pulmonary nodule increased in size as well as along the fissure line concerning for recurrence of malignancy and or 2 lesions. The patient's images have been independently reviewed by me.    CT chest follow-up November 2024: Pending  Pulmonary Functions Testing Results:    Latest Ref Rng & Units 08/13/2017    3:00 PM 10/04/2016   11:03 AM  PFT Results  FVC-Pre L 3.10  3.14   FVC-Predicted Pre % 67  67   FVC-Post L  3.26   FVC-Predicted Post %  70   Pre FEV1/FVC % % 64  62   Post FEV1/FCV % %  64   FEV1-Pre L 1.98  1.96   FEV1-Predicted Pre % 59  58   FEV1-Post L  2.10   DLCO uncorrected ml/min/mmHg 12.40  14.56   DLCO UNC% % 34  40   DLCO corrected ml/min/mmHg  14.44   DLCO COR %Predicted %  40   DLVA Predicted % 56  59   TLC L 7.39  5.77   TLC % Predicted % 97  76   RV % Predicted % 156  92     FeNO:   Pathology:   Echocardiogram:  Heart Catheterization:     Assessment & Plan:     ICD-10-CM   1. Non-small cell lung cancer, unspecified laterality (HCC)  C34.90     2. Former smoker  Z87.891     3. History of lung cancer  Z85.118     4. Personal history of colon cancer  Z85.038     5. Status post bronchoscopy with biopsy  Z98.890         Discussion:  This is a 82 year old gentleman history of lung cancer status post right lower lobectomy at [facility] in 2012 with a history of colon cancer.  Followed by medical oncology with Dr. Truett Perna.  Patient had CT imaging with a progression of a right upper lobe lesion.  Biopsy was  suspicious for non-small cell carcinoma.  Was referred to radiation oncology.  Decision was made for repeat CT scan in 3 to 6 months and if there is any sign of progression would consider SBRT at that time.  Plan: Patient's CT imaging is scheduled in November.  Follow-up imaging has not yet been complete. There is not much for [name] to talk about today. He needs to keep his appointments as scheduled with medical oncology and radiation oncology after his follow-up imaging is complete.  He should continue his current inhaler regimen with use of Breztri and as needed albuterol.   Current Outpatient Medications:    acetaminophen (TYLENOL) 325 MG tablet, Take 650 mg by mouth every 6 (six) hours as needed for moderate pain or headache., Disp: , Rfl:    albuterol (VENTOLIN HFA) 108 (90 Base) MCG/ACT inhaler, Inhale 2 puffs into the lungs every 6 (six) hours as needed for wheezing or shortness of breath., Disp: , Rfl:    amLODipine (NORVASC) 10 MG tablet, Take 10 mg by mouth daily., Disp: , Rfl:    apixaban (ELIQUIS) 2.5 MG TABS tablet, Take 1 tablet (2.5 mg total) by mouth 2 (two) times daily. Okay to restart this medication on 03/27/2023, Disp: 60 tablet, Rfl:    Brinzolamide-Brimonidine (SIMBRINZA) 1-0.2 % SUSP, Place 1 drop into both eyes 3 (three) times daily. 8a, 2p, 8p, Disp: , Rfl:    Budeson-Glycopyrrol-Formoterol (BREZTRI AEROSPHERE) 160-9-4.8 MCG/ACT AERO, Inhale 2 puffs into the lungs in the morning and at bedtime., Disp: 10.7 g, Rfl: 0   diclofenac Sodium (VOLTAREN) 1 % GEL, Apply 2 g topically 4 (four) times daily., Disp: , Rfl:    docusate sodium (COLACE) 100 MG capsule, Take 100 mg by mouth 2 (two) times daily as needed for moderate constipation., Disp: , Rfl:    escitalopram (LEXAPRO) 20 MG tablet, Take 1 tablet (20 mg total) by mouth daily., Disp: 90 tablet, Rfl: 3   furosemide (LASIX) 20 MG tablet, Take 80 mg by mouth daily., Disp: , Rfl:    JARDIANCE 10 MG TABS tablet, TAKE ONE TABLET BY  MOUTH EVERY DAY BEFORE BREAKFAST., Disp: 30 tablet, Rfl: 11   latanoprost (XALATAN) 0.005 % ophthalmic solution, Place 1 drop into both eyes at bedtime., Disp: , Rfl:    Melatonin 10 MG TABS, Take 20 mg by mouth at bedtime., Disp: , Rfl:    metoprolol succinate (TOPROL-XL) 25 MG 24 hr tablet, Take 0.5 tablets (12.5 mg total) by mouth daily., Disp: 45 tablet, Rfl: 3   montelukast (SINGULAIR) 10 MG tablet, TAKE ONE TABLET BY MOUTH AT BEDTIME, Disp: 28 tablet, Rfl: 5   omeprazole (PRILOSEC OTC) 20 MG tablet, Take 20 mg by mouth daily., Disp: , Rfl:  omeprazole (PRILOSEC) 20 MG capsule, Take 20 mg by mouth daily., Disp: , Rfl:    Polyethyl Glycol-Propyl Glycol (SYSTANE ULTRA) 0.4-0.3 % SOLN, Place 1 drop into both eyes daily as needed (dry eyes)., Disp: , Rfl:    potassium chloride SA (KLOR-CON M) 20 MEQ tablet, TAKE TWO TABLETS BY MOUTH IN THE MORNING AND TAKE ONE TABLET IN THE EVENING (Patient taking differently: Take 40 mEq by mouth daily.), Disp: 90 tablet, Rfl: 6   PRESCRIPTION MEDICATION, Pt uses CPAP machine at bedtime, Disp: , Rfl:    rosuvastatin (CRESTOR) 40 MG tablet, TAKE ONE TABLET BY MOUTH EVERY DAY, Disp: 90 tablet, Rfl: 3   senna-docusate (SENOKOT-S) 8.6-50 MG tablet, Take 1 tablet by mouth daily., Disp: , Rfl:    spironolactone (ALDACTONE) 25 MG tablet, Take 25 mg by mouth daily., Disp: , Rfl:    Josephine [name], DO  Pulmonary Critical Care 07/18/2023 11:39 AM

## 2023-07-18 NOTE — Patient Instructions (Addendum)
Thank you for visiting Dr. Tonia Brooms at [facility].  Follow up with Dr. Roselind Messier and Dr. Truett Perna as scheduled.   Return in about 6 months (around 01/16/2024) for with APP or Dr. Tonia Brooms.    Please do your part to reduce the spread of COVID-19.

## 2023-07-26 DIAGNOSIS — G4733 Obstructive sleep apnea (adult) (pediatric): Secondary | ICD-10-CM | POA: Diagnosis not present

## 2023-07-29 ENCOUNTER — Ambulatory Visit: Payer: PPO

## 2023-07-30 DIAGNOSIS — I509 Heart failure, unspecified: Secondary | ICD-10-CM | POA: Diagnosis not present

## 2023-07-30 DIAGNOSIS — R531 Weakness: Secondary | ICD-10-CM | POA: Diagnosis not present

## 2023-07-30 DIAGNOSIS — J449 Chronic obstructive pulmonary disease, unspecified: Secondary | ICD-10-CM | POA: Diagnosis not present

## 2023-07-30 DIAGNOSIS — R2681 Unsteadiness on feet: Secondary | ICD-10-CM | POA: Diagnosis not present

## 2023-07-30 DIAGNOSIS — K219 Gastro-esophageal reflux disease without esophagitis: Secondary | ICD-10-CM | POA: Diagnosis not present

## 2023-07-31 ENCOUNTER — Encounter: Payer: Self-pay | Admitting: Family Medicine

## 2023-07-31 ENCOUNTER — Ambulatory Visit (INDEPENDENT_AMBULATORY_CARE_PROVIDER_SITE_OTHER): Payer: PPO | Admitting: Family Medicine

## 2023-07-31 ENCOUNTER — Ambulatory Visit (INDEPENDENT_AMBULATORY_CARE_PROVIDER_SITE_OTHER): Payer: PPO

## 2023-07-31 VITALS — BP 142/80 | HR 53 | Temp 97.6°F | Ht 73.0 in

## 2023-07-31 DIAGNOSIS — I5032 Chronic diastolic (congestive) heart failure: Secondary | ICD-10-CM

## 2023-07-31 DIAGNOSIS — R058 Other specified cough: Secondary | ICD-10-CM

## 2023-07-31 DIAGNOSIS — I10 Essential (primary) hypertension: Secondary | ICD-10-CM | POA: Diagnosis not present

## 2023-07-31 DIAGNOSIS — Z85118 Personal history of other malignant neoplasm of bronchus and lung: Secondary | ICD-10-CM

## 2023-07-31 DIAGNOSIS — J449 Chronic obstructive pulmonary disease, unspecified: Secondary | ICD-10-CM | POA: Diagnosis not present

## 2023-07-31 LAB — CBC WITH DIFFERENTIAL/PLATELET
Basophils Absolute: 0 10*3/uL (ref 0.0–0.1)
Basophils Relative: 0.5 % (ref 0.0–3.0)
Eosinophils Absolute: 0.1 10*3/uL (ref 0.0–0.7)
Eosinophils Relative: 1.6 % (ref 0.0–5.0)
HCT: 42.8 % (ref 39.0–52.0)
Hemoglobin: 13.7 g/dL (ref 13.0–17.0)
Lymphocytes Relative: 10.4 % — ABNORMAL LOW (ref 12.0–46.0)
Lymphs Abs: 0.6 10*3/uL — ABNORMAL LOW (ref 0.7–4.0)
MCHC: 31.9 g/dL (ref 30.0–36.0)
MCV: 89.5 fL (ref 78.0–100.0)
Monocytes Absolute: 0.2 10*3/uL (ref 0.1–1.0)
Monocytes Relative: 4.1 % (ref 3.0–12.0)
Neutro Abs: 4.8 10*3/uL (ref 1.4–7.7)
Neutrophils Relative %: 83.4 % — ABNORMAL HIGH (ref 43.0–77.0)
Platelets: 349 10*3/uL (ref 150.0–400.0)
RBC: 4.78 Mil/uL (ref 4.22–5.81)
RDW: 17.5 % — ABNORMAL HIGH (ref 11.5–15.5)
WBC: 5.7 10*3/uL (ref 4.0–10.5)

## 2023-07-31 LAB — BASIC METABOLIC PANEL
BUN: 18 mg/dL (ref 6–23)
CO2: 29 meq/L (ref 19–32)
Calcium: 9.7 mg/dL (ref 8.4–10.5)
Chloride: 97 meq/L (ref 96–112)
Creatinine, Ser: 1.66 mg/dL — ABNORMAL HIGH (ref 0.40–1.50)
GFR: 38.07 mL/min — ABNORMAL LOW (ref >60.00)
Glucose, Bld: 177 mg/dL — ABNORMAL HIGH (ref 70–99)
Potassium: 4.3 meq/L (ref 3.5–5.1)
Sodium: 135 meq/L (ref 135–145)

## 2023-07-31 LAB — BRAIN NATRIURETIC PEPTIDE: Pro B Natriuretic peptide (BNP): 211 pg/mL — ABNORMAL HIGH (ref 0.0–100.0)

## 2023-07-31 NOTE — Patient Instructions (Signed)
Please go downstairs for labs and a chest X ray.   Stop taking Magic Mouthwash as advised by your dentist.   We will be in touch with your lab results and recommendations.

## 2023-07-31 NOTE — Progress Notes (Signed)
Please call and let him (and his daughter) that his chest X ray does not show pneumonia or anything new. Hopefully his cough will calm down now that we have stopped the mouthwash and if not, let us know.

## 2023-07-31 NOTE — Progress Notes (Signed)
Subjective:  Troy Delapena [name] is a 82 y.o. male who presents for a 4 wk hx of cough. His caregiver is with him from [place]. He is sitting in a wheelchair and spends most of time in the chair. Walks with walker some.  States cough started around the same time as the [employer] he was prescribed by his dentist.  Caregiver states she thought the medication was to be taken for only 2 weeks but not written on the [facility] at the [facility].  Denies fever, chills, dizziness, ear pain, ST, chest pain, palpitations, shortness of breath, abdominal pain, N/V/D, LE edema.  Denies any appetite or oral intake changes. Activities are same as usual.   Hx of COPD on Breztri bid. Has not needed albuterol lately.   Hx of lung cancer  Medication list from [place] with him.     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     ROS as in subjective.   Objective: Vitals:   07/31/23 1119  BP: (!) 142/80  Pulse: (!) 53  Temp: 97.6 F (36.4 C)  SpO2: 97%    General appearance: Alert, WD/WN, no distress                             Skin: warm, dry                           Head: no sinus tenderness                            Eyes: conjunctiva normal, corneas clear, PERRLA                            Ears: pearly TMs, external ear canals normal                          Nose: septum midline, no congestion or drainage              Mouth/throat: MMM, tongue normal, mild pharyngeal erythema                           Neck: supple, no adenopathy, no thyromegaly, nontender                          Heart: bradycardia, irregular rhythm                          Lungs: CTA bilaterally, no wheezes, rales, or rhonchi   Extremities without edema      Assessment: Cough present for greater than 3 weeks - Plan: CBC with Differential/Platelet, Brain natriuretic peptide, Basic metabolic panel, DG Chest 2 View, Basic metabolic panel, Brain natriuretic peptide, CBC with Differential/Platelet  Chronic  diastolic CHF (congestive heart failure) (HCC) - Plan: Brain natriuretic peptide, Basic metabolic panel, DG Chest 2 View, Basic metabolic panel, Brain natriuretic peptide  Benign essential HTN  COPD GOLD II  - Plan: DG Chest 2 View  History of lung cancer - Plan: DG Chest 2 View   Plan: Euvolemic.  Chest X ray, CBC, BNP and BMP ordered.  Caregiver called dentist office and patient was supposed to stop Magic Mouthwash after 2 weeks.  Orders written to D/C mouthwash.  Monitor BP Follow up with PCP as recommended.

## 2023-08-19 ENCOUNTER — Ambulatory Visit (HOSPITAL_BASED_OUTPATIENT_CLINIC_OR_DEPARTMENT_OTHER)
Admission: RE | Admit: 2023-08-19 | Discharge: 2023-08-19 | Disposition: A | Discharge status: Home / Self Care | Payer: PPO | Source: Ambulatory Visit | Attending: Oncology | Admitting: Oncology

## 2023-08-19 DIAGNOSIS — C187 Malignant neoplasm of sigmoid colon: Secondary | ICD-10-CM | POA: Insufficient documentation

## 2023-08-19 DIAGNOSIS — Z85118 Personal history of other malignant neoplasm of bronchus and lung: Secondary | ICD-10-CM | POA: Diagnosis not present

## 2023-08-21 ENCOUNTER — Telehealth: Payer: Self-pay

## 2023-08-21 ENCOUNTER — Ambulatory Visit: Payer: PPO | Admitting: Primary Care

## 2023-08-21 NOTE — Telephone Encounter (Signed)
He was recently seen in October by Dr. Tonia Brooms and recommended to follow-up in 6 months, unless having acute/new respiratory symptoms can cancel todays visit and reschedule for April 2025

## 2023-08-21 NOTE — Progress Notes (Deleted)
@Patient  ID: Troy Bruce [name], male    DOB: Sep 21, 1941, 82 y.o.   MRN: [id number]  No chief complaint on file.   Referring provider: Pincus Sanes, MD  HPI:       Allergies  Allergen Reactions   Niacin-Lovastatin Er Shortness Of Breath and Other (See Comments)    Dyspnea Flushing  *Not listed on MAR    Penicillins Hives, Shortness Of Breath and Other (See Comments)    Dyspnea Flushing Because of a history of documented adverse serious drug reaction;Medi Alert bracelet  is recommended PCN reaction causing immediate rash, facial/tongue/throat swelling, SOB or lightheadedness with hypotension: Yes PCN reaction causing severe rash involving mucus membranes or skin necrosis: No PCN reaction occurring within the last 10 years: NO PCN reaction that required hospitalization: NO   Sulfonamide Derivatives Hives, Shortness Of Breath and Other (See Comments)    Dyspnea  Flushing Because of a history of documented adverse serious drug reaction;Medi Alert bracelet  is recommended   Lipitor [Atorvastatin] Other (See Comments)    Myalgias Arthralgias   *Not listed on MAR     Immunization History  Administered Date(s) Administered   Fluad Quad(high Dose 65+) 08/11/2020, 07/11/2022   Influenza Split 07/05/2017   Influenza Whole 07/28/2010   Influenza,inj,Quad PF,6+ Mos 07/07/2013, 07/30/2014   Influenza-Unspecified 07/02/2015, 07/01/2021   PFIZER(Purple Top)SARS-COV-2 Vaccination 10/18/2019, 11/08/2019, 06/14/2020, 04/14/2021   Pneumococcal Conjugate-13 11/28/2015   Pneumococcal Polysaccharide-23 10/02/2007   Td 03/14/2010   Tdap 02/12/2022    Past Medical History:  Diagnosis Date   AAA (abdominal aortic aneurysm) (HCC) 10-20-17   MONITORED BY DR Edilia Bo, 3.7 CM by ABDOMINAL US 11/2021   Anxiety    Arthritis    Atrial fibrillation (HCC)    Basal cell carcinoma    BPH (benign prostatic hypertrophy)    Chronic diastolic heart failure (HCC)    Chronic kidney  disease    stage 3, pt unaware   Colon cancer (HCC)    COPD (chronic obstructive pulmonary disease) (HCC)    Depression    GERD (gastroesophageal reflux disease)    Glaucoma BOTH EYES   Dr Nile Riggs   History of lung cancer APRIL 2012   ONCOLOGIST- DR Mariel Sleet  LOV IN [facility] 10-27-2012 SQUAMOUS CELL---- S/P RIGHT LOWER LOBECTOMY AT [facility] -- NO CHEMORADIATION--- NO RECURRENCE   Hx of adenomatous colonic polyps 2005    X 2; 1 hyperplastic polyp; Dr Juanda Chance   Hyperlipidemia    Hypertension    OSA (obstructive sleep apnea) 08/29/2015   CPAP SET ON 10   Peripheral vascular disease (HCC)    FOLLOWED  BY DR DICKSON   Spinal stenosis 06/2014   Thoracic aorta atherosclerosis (HCC)    Thyrotoxicosis    amiodarone induced    Tobacco History: Social History   Tobacco Use  Smoking Status Former   Current packs/day: 0.00   Average packs/day: 0.5 packs/day for 50.0 years (25.0 ttl pk-yrs)   Types: Cigarettes   Start date: 07/28/1964   Quit date: 07/28/2014   Years since quitting: 9.0   Passive exposure: Never  Smokeless Tobacco Never   Counseling given: Not Answered   Outpatient Medications Prior to Visit  Medication Sig Dispense Refill   acetaminophen (TYLENOL) 325 MG tablet Take 650 mg by mouth every 6 (six) hours as needed for moderate pain or headache.     albuterol (VENTOLIN HFA) 108 (90 Base) MCG/ACT inhaler Inhale 2 puffs into the lungs every 6 (six) hours as needed for wheezing  or shortness of breath.     amLODipine (NORVASC) 10 MG tablet Take 10 mg by mouth daily.     apixaban (ELIQUIS) 2.5 MG TABS tablet Take 1 tablet (2.5 mg total) by mouth 2 (two) times daily. Okay to restart this medication on 03/27/2023 60 tablet    Brinzolamide-Brimonidine (SIMBRINZA) 1-0.2 % SUSP Place 1 drop into both eyes 3 (three) times daily. 8a, 2p, 8p     Budeson-Glycopyrrol-Formoterol (BREZTRI AEROSPHERE) 160-9-4.8 MCG/ACT AERO Inhale 2 puffs into the lungs in the morning and at bedtime. 10.7 g 0    diclofenac Sodium (VOLTAREN) 1 % GEL Apply 2 g topically 4 (four) times daily.     docusate sodium (COLACE) 100 MG capsule Take 100 mg by mouth 2 (two) times daily as needed for moderate constipation.     escitalopram (LEXAPRO) 20 MG tablet Take 1 tablet (20 mg total) by mouth daily. 90 tablet 3   furosemide (LASIX) 20 MG tablet Take 80 mg by mouth daily.     JARDIANCE 10 MG TABS tablet TAKE ONE TABLET BY MOUTH EVERY DAY BEFORE BREAKFAST. 30 tablet 11   latanoprost (XALATAN) 0.005 % ophthalmic solution Place 1 drop into both eyes at bedtime.     Melatonin 10 MG TABS Take 20 mg by mouth at bedtime.     metoprolol succinate (TOPROL-XL) 25 MG 24 hr tablet Take 0.5 tablets (12.5 mg total) by mouth daily. 45 tablet 3   montelukast (SINGULAIR) 10 MG tablet TAKE ONE TABLET BY MOUTH AT BEDTIME 28 tablet 5   omeprazole (PRILOSEC OTC) 20 MG tablet Take 20 mg by mouth daily.     omeprazole (PRILOSEC) 20 MG capsule Take 20 mg by mouth daily.     Polyethyl Glycol-Propyl Glycol (SYSTANE ULTRA) 0.4-0.3 % SOLN Place 1 drop into both eyes daily as needed (dry eyes).     potassium chloride SA (KLOR-CON M) 20 MEQ tablet TAKE TWO TABLETS BY MOUTH IN THE MORNING AND TAKE ONE TABLET IN THE EVENING (Patient taking differently: Take 40 mEq by mouth daily.) 90 tablet 6   PRESCRIPTION MEDICATION Pt uses CPAP machine at bedtime     rosuvastatin (CRESTOR) 40 MG tablet TAKE ONE TABLET BY MOUTH EVERY DAY 90 tablet 3   senna-docusate (SENOKOT-S) 8.6-50 MG tablet Take 1 tablet by mouth daily.     spironolactone (ALDACTONE) 25 MG tablet Take 25 mg by mouth daily.     No facility-administered medications prior to visit.      Review of Systems  Review of Systems   Physical Exam  There were no vitals taken for this visit. Physical Exam   Lab Results:  CBC    Component Value Date/Time   WBC 5.7 07/31/2023 1201   RBC 4.78 07/31/2023 1201   HGB 13.7 07/31/2023 1201   HGB 14.3 02/09/2021 1136   HGB 12.4 (L)  05/16/2020 1615   HGB 11.8 (L) 03/07/2016 1213   HCT 42.8 07/31/2023 1201   HCT 37.9 05/16/2020 1615   HCT 36.3 (L) 03/07/2016 1213   PLT 349.0 07/31/2023 1201   PLT 248 02/09/2021 1136   PLT 329 05/16/2020 1615   MCV 89.5 07/31/2023 1201   MCV 94 05/16/2020 1615   MCV 85.0 03/07/2016 1213   MCH 28.4 04/30/2023 1557   MCHC 31.9 07/31/2023 1201   RDW 17.5 (H) 07/31/2023 1201   RDW 14.2 05/16/2020 1615   RDW 14.7 (H) 03/07/2016 1213   LYMPHSABS 0.6 (L) 07/31/2023 1201   LYMPHSABS 0.9 05/16/2020 1615  LYMPHSABS 0.8 (L) 03/07/2016 1213   MONOABS 0.2 07/31/2023 1201   MONOABS 0.8 03/07/2016 1213   EOSABS 0.1 07/31/2023 1201   EOSABS 0.1 05/16/2020 1615   BASOSABS 0.0 07/31/2023 1201   BASOSABS 0.0 05/16/2020 1615   BASOSABS 0.0 03/07/2016 1213    BMET    Component Value Date/Time   NA 135 07/31/2023 1201   NA 142 05/16/2020 1615   NA 142 03/07/2016 1213   K 4.3 07/31/2023 1201   K 4.5 03/07/2016 1213   CL 97 07/31/2023 1201   CO2 29 07/31/2023 1201   CO2 25 03/07/2016 1213   GLUCOSE 177 (H) 07/31/2023 1201   GLUCOSE 85 03/07/2016 1213   BUN 18 07/31/2023 1201   BUN 17 05/16/2020 1615   BUN 12.5 03/07/2016 1213   CREATININE 1.66 (H) 07/31/2023 1201   CREATININE 1.38 (H) 02/09/2021 1136   CREATININE 1.62 (H) 09/20/2016 1141   CREATININE 1.2 03/07/2016 1213   CALCIUM 9.7 07/31/2023 1201   CALCIUM 9.8 03/07/2016 1213   GFRNONAA 48 (L) 04/30/2023 1557   GFRNONAA 52 (L) 02/09/2021 1136   GFRAA 74 05/16/2020 1615   GFRAA >60 01/14/2020 1001    BNP    Component Value Date/Time   BNP 286.0 (H) 04/30/2023 1557   BNP 42.6 09/13/2016 1700    ProBNP    Component Value Date/Time   PROBNP 211.0 (H) 07/31/2023 1201    Imaging: DG Chest 2 View  Result Date: 07/31/2023 CLINICAL DATA:  Cough for 4 weeks EXAM: CHEST - 2 VIEW COMPARISON:  Chest radiograph 04/25/2023 FINDINGS: A loop recorder is again noted. The cardiomediastinal silhouette is stable. Aeration of the  right lower lobe has improved which may reflect improved hemorrhage following biopsy. There is similar volume loss in the right hemithorax following right lower lobe resection. A right pleural effusion is similar to the prior study. The left lung is clear. There is no significant left effusion. There is no pneumothorax There is no acute osseous abnormality. IMPRESSION: 1. Improved aeration of the right lower lobe since 03/26/2023 which may reflect improved post biopsy hemorrhage. 2. Similar chronic right pleural effusion. Electronically Signed   By: Lesia Hausen M.D.   On: 07/31/2023 14:48     Assessment & Plan:   No problem-specific Assessment & Plan notes found for this encounter.     Glenford Bayley, NP 08/21/2023

## 2023-08-26 ENCOUNTER — Encounter (HOSPITAL_COMMUNITY): Payer: Self-pay

## 2023-08-26 ENCOUNTER — Emergency Department (HOSPITAL_COMMUNITY): Payer: PPO

## 2023-08-26 ENCOUNTER — Inpatient Hospital Stay: Payer: PPO | Admitting: Oncology

## 2023-08-26 ENCOUNTER — Other Ambulatory Visit: Payer: Self-pay

## 2023-08-26 ENCOUNTER — Telehealth: Payer: Self-pay | Admitting: *Deleted

## 2023-08-26 ENCOUNTER — Emergency Department (HOSPITAL_COMMUNITY)
Admission: EM | Admit: 2023-08-26 | Discharge: 2023-08-26 | Disposition: A | Discharge status: Home / Self Care | Payer: PPO | Attending: Emergency Medicine | Admitting: Emergency Medicine

## 2023-08-26 DIAGNOSIS — Z7901 Long term (current) use of anticoagulants: Secondary | ICD-10-CM | POA: Insufficient documentation

## 2023-08-26 DIAGNOSIS — I10 Essential (primary) hypertension: Secondary | ICD-10-CM | POA: Diagnosis not present

## 2023-08-26 DIAGNOSIS — I509 Heart failure, unspecified: Secondary | ICD-10-CM | POA: Insufficient documentation

## 2023-08-26 DIAGNOSIS — G4733 Obstructive sleep apnea (adult) (pediatric): Secondary | ICD-10-CM | POA: Diagnosis not present

## 2023-08-26 DIAGNOSIS — S0990XA Unspecified injury of head, initial encounter: Secondary | ICD-10-CM | POA: Diagnosis not present

## 2023-08-26 DIAGNOSIS — Z79899 Other long term (current) drug therapy: Secondary | ICD-10-CM | POA: Insufficient documentation

## 2023-08-26 DIAGNOSIS — S22030A Wedge compression fracture of third thoracic vertebra, initial encounter for closed fracture: Secondary | ICD-10-CM

## 2023-08-26 DIAGNOSIS — S22039A Unspecified fracture of third thoracic vertebra, initial encounter for closed fracture: Secondary | ICD-10-CM | POA: Diagnosis not present

## 2023-08-26 DIAGNOSIS — S199XXA Unspecified injury of neck, initial encounter: Secondary | ICD-10-CM | POA: Diagnosis not present

## 2023-08-26 DIAGNOSIS — I5033 Acute on chronic diastolic (congestive) heart failure: Secondary | ICD-10-CM | POA: Insufficient documentation

## 2023-08-26 DIAGNOSIS — I6782 Cerebral ischemia: Secondary | ICD-10-CM | POA: Diagnosis not present

## 2023-08-26 DIAGNOSIS — I110 Hypertensive heart disease with heart failure: Secondary | ICD-10-CM | POA: Diagnosis not present

## 2023-08-26 DIAGNOSIS — M4854XA Collapsed vertebra, not elsewhere classified, thoracic region, initial encounter for fracture: Secondary | ICD-10-CM | POA: Diagnosis not present

## 2023-08-26 DIAGNOSIS — J449 Chronic obstructive pulmonary disease, unspecified: Secondary | ICD-10-CM | POA: Insufficient documentation

## 2023-08-26 DIAGNOSIS — W01198A Fall on same level from slipping, tripping and stumbling with subsequent striking against other object, initial encounter: Secondary | ICD-10-CM | POA: Diagnosis not present

## 2023-08-26 DIAGNOSIS — Z7951 Long term (current) use of inhaled steroids: Secondary | ICD-10-CM | POA: Diagnosis not present

## 2023-08-26 DIAGNOSIS — S299XXA Unspecified injury of thorax, initial encounter: Secondary | ICD-10-CM | POA: Diagnosis not present

## 2023-08-26 DIAGNOSIS — S29002A Unspecified injury of muscle and tendon of back wall of thorax, initial encounter: Secondary | ICD-10-CM | POA: Diagnosis present

## 2023-08-26 DIAGNOSIS — I4891 Unspecified atrial fibrillation: Secondary | ICD-10-CM | POA: Insufficient documentation

## 2023-08-26 DIAGNOSIS — J90 Pleural effusion, not elsewhere classified: Secondary | ICD-10-CM | POA: Insufficient documentation

## 2023-08-26 DIAGNOSIS — W19XXXA Unspecified fall, initial encounter: Secondary | ICD-10-CM

## 2023-08-26 DIAGNOSIS — Y92009 Unspecified place in unspecified non-institutional (private) residence as the place of occurrence of the external cause: Secondary | ICD-10-CM | POA: Insufficient documentation

## 2023-08-26 DIAGNOSIS — I959 Hypotension, unspecified: Secondary | ICD-10-CM | POA: Diagnosis not present

## 2023-08-26 DIAGNOSIS — M549 Dorsalgia, unspecified: Secondary | ICD-10-CM | POA: Diagnosis not present

## 2023-08-26 DIAGNOSIS — M47812 Spondylosis without myelopathy or radiculopathy, cervical region: Secondary | ICD-10-CM | POA: Diagnosis not present

## 2023-08-26 DIAGNOSIS — R0902 Hypoxemia: Secondary | ICD-10-CM | POA: Diagnosis not present

## 2023-08-26 DIAGNOSIS — R918 Other nonspecific abnormal finding of lung field: Secondary | ICD-10-CM | POA: Diagnosis not present

## 2023-08-26 LAB — CBC
HCT: 48.1 % (ref 39.0–52.0)
Hemoglobin: 15.5 g/dL (ref 13.0–17.0)
MCH: 29.1 pg (ref 26.0–34.0)
MCHC: 32.2 g/dL (ref 30.0–36.0)
MCV: 90.2 fL (ref 80.0–100.0)
Platelets: 342 10*3/uL (ref 150–400)
RBC: 5.33 MIL/uL (ref 4.22–5.81)
RDW: 15.9 % — ABNORMAL HIGH (ref 11.5–15.5)
WBC: 6.7 10*3/uL (ref 4.0–10.5)
nRBC: 0 % (ref 0.0–0.2)

## 2023-08-26 LAB — COMPREHENSIVE METABOLIC PANEL
ALT: 11 U/L (ref 0–44)
AST: 22 U/L (ref 15–41)
Albumin: 3.8 g/dL (ref 3.5–5.0)
Alkaline Phosphatase: 82 U/L (ref 38–126)
Anion gap: 15 (ref 5–15)
BUN: 18 mg/dL (ref 8–23)
CO2: 20 mmol/L — ABNORMAL LOW (ref 22–32)
Calcium: 9.7 mg/dL (ref 8.9–10.3)
Chloride: 100 mmol/L (ref 98–111)
Creatinine, Ser: 1.42 mg/dL — ABNORMAL HIGH (ref 0.61–1.24)
GFR, Estimated: 49 mL/min — ABNORMAL LOW (ref >60)
Glucose, Bld: 129 mg/dL — ABNORMAL HIGH (ref 70–99)
Potassium: 4.7 mmol/L (ref 3.5–5.1)
Sodium: 135 mmol/L (ref 135–145)
Total Bilirubin: 1.1 mg/dL (ref <1.2)
Total Protein: 7.8 g/dL (ref 6.5–8.1)

## 2023-08-26 LAB — I-STAT CG4 LACTIC ACID, ED: Lactic Acid, Venous: 1.7 mmol/L (ref 0.5–1.9)

## 2023-08-26 LAB — URINALYSIS, ROUTINE W REFLEX MICROSCOPIC
Bacteria, UA: NONE SEEN
Bilirubin Urine: NEGATIVE
Glucose, UA: >=500 mg/dL — AB
Hgb urine dipstick: NEGATIVE
Ketones, ur: NEGATIVE mg/dL
Leukocytes,Ua: NEGATIVE
Nitrite: NEGATIVE
Protein, ur: 100 mg/dL — AB
Specific Gravity, Urine: 1.021 (ref 1.005–1.030)
pH: 6 (ref 5.0–8.0)

## 2023-08-26 LAB — SAMPLE TO BLOOD BANK

## 2023-08-26 LAB — I-STAT CHEM 8, ED
BUN: 21 mg/dL (ref 8–23)
Calcium, Ion: 1.06 mmol/L — ABNORMAL LOW (ref 1.15–1.40)
Chloride: 103 mmol/L (ref 98–111)
Creatinine, Ser: 1.6 mg/dL — ABNORMAL HIGH (ref 0.61–1.24)
Glucose, Bld: 127 mg/dL — ABNORMAL HIGH (ref 70–99)
HCT: 50 % (ref 39.0–52.0)
Hemoglobin: 17 g/dL (ref 13.0–17.0)
Potassium: 4.5 mmol/L (ref 3.5–5.1)
Sodium: 137 mmol/L (ref 135–145)
TCO2: 22 mmol/L (ref 22–32)

## 2023-08-26 LAB — PROTIME-INR
INR: 1.1 (ref 0.8–1.2)
Prothrombin Time: 14.8 s (ref 11.4–15.2)

## 2023-08-26 LAB — ETHANOL: Alcohol, Ethyl (B): <10 mg/dL (ref <10)

## 2023-08-26 MED ORDER — LIDOCAINE 5 % EX PTCH
1.0000 | MEDICATED_PATCH | Freq: Once | CUTANEOUS | Status: DC
  Administered 2023-08-26: 1 via TRANSDERMAL
  Filled 2023-08-26: qty 1

## 2023-08-26 MED ORDER — LIDOCAINE 5 % EX PTCH: 1.0000 | MEDICATED_PATCH | CUTANEOUS | 0 refills | Status: AC

## 2023-08-26 MED ORDER — ACETAMINOPHEN 500 MG PO TABS
1000.0000 mg | ORAL_TABLET | Freq: Once | ORAL | Status: AC
  Administered 2023-08-26: 1000 mg via ORAL
  Filled 2023-08-26: qty 2

## 2023-08-26 MED ORDER — ACETAMINOPHEN 500 MG PO TABS: 500.0000 mg | ORAL_TABLET | Freq: Four times a day (QID) | ORAL | 0 refills | Status: AC | PRN

## 2023-08-26 MED ORDER — FUROSEMIDE 20 MG PO TABS
80.0000 mg | ORAL_TABLET | Freq: Once | ORAL | Status: AC
  Administered 2023-08-26: 80 mg via ORAL
  Filled 2023-08-26: qty 4

## 2023-08-26 MED ORDER — FUROSEMIDE 10 MG/ML IJ SOLN: 40.0000 mg | Freq: Once | INTRAMUSCULAR | Status: DC

## 2023-08-26 NOTE — ED Notes (Signed)
Pt in CT accompanied by this RN at this time.

## 2023-08-26 NOTE — Discharge Instructions (Signed)
Were seen in the emergency department after your fall.  You did fracture the bone in your back called T3 in your spine.  You can take either 1 Tylenol Extra Strength every 4-6 hours or 2 Tylenol Extra Strength every 8 hours and use the lidocaine patches for pain as well as ice or heat.  You can follow-up with the neurosurgeon in the next 1 to 2 weeks to have your symptoms rechecked.  Your workup also incidentally showed that you had some fluid on your lungs.  We did give you some Lasix through the IV in the ER and you can follow-up with your primary doctor or your cardiologist for further management.  You should return to the emergency department for significantly worsening pain, numbness or weakness in your arms or any other new or concerning symptoms.

## 2023-08-26 NOTE — ED Notes (Signed)
Kingsley DO at bedside.

## 2023-08-26 NOTE — Progress Notes (Signed)
Orthopedic Tech Progress Note Patient Details:  Troy Lagow [name] 1941/09/19 [id number] Checked in for Level 2 Trauma.  Patient ID: [name], male   DOB: 03-22-1941, 82 y.o.   MRN: [id number]  Blase Mess 08/26/2023, 10:00 AM

## 2023-08-26 NOTE — Telephone Encounter (Signed)
Mr. Wion had fallen today at [facility] and injured his upper back. Is uncomfortable today. Daughter requests a telephone visit later this week. Per Dr. Wilber Bihari schedule for this week, Wednesday

## 2023-08-26 NOTE — ED Triage Notes (Signed)
Pt BIB [facility] after fall at home. Pt on Eliquis. Pt was sitting on recliner, got up using wheelchair, then slipped and fell. Pt denies LOC and is Aox4. Reports head and back pain.

## 2023-08-26 NOTE — Progress Notes (Unsigned)
Subjective:    Patient ID: Troy Bruce [name], male    DOB: 02/29/1941, 82 y.o.   MRN: [id number]     HPI Takeru is here for follow up from the ED  Fall yesterday at home - taken to ED for evaluation.  He was trying to stand up using rolling walker yesterday morning and slipped and fell and hit the back of his head.  Had pain in back of head and mid back.  Denied dizziness, CP, SOB, N/T, weakness or nausea.   Imaging - acute T3 compression fx, cxr with chronic, stable right pleural effusion and increasing b/l opacities - infection or edema.  He did admit to increased SOB recently. No fever or cough.  Discussed fx with Dr Jordan Likes - light activity - f/u in a few weeks.   In ED received lasix 80 mg  x 1  Medications and allergies reviewed with patient and updated if appropriate.  Current Outpatient Medications on File Prior to Visit  Medication Sig Dispense Refill   acetaminophen (TYLENOL) 325 MG tablet Take 650 mg by mouth every 6 (six) hours as needed for moderate pain or headache.     acetaminophen (TYLENOL) 500 MG tablet Take 1 tablet (500 mg total) by mouth every 6 (six) hours as needed. 30 tablet 0   albuterol (VENTOLIN HFA) 108 (90 Base) MCG/ACT inhaler Inhale 2 puffs into the lungs every 6 (six) hours as needed for wheezing or shortness of breath.     amLODipine (NORVASC) 10 MG tablet Take 10 mg by mouth daily.     apixaban (ELIQUIS) 2.5 MG TABS tablet Take 1 tablet (2.5 mg total) by mouth 2 (two) times daily. Okay to restart this medication on 03/27/2023 60 tablet    Brinzolamide-Brimonidine (SIMBRINZA) 1-0.2 % SUSP Place 1 drop into both eyes 3 (three) times daily. 8a, 2p, 8p     Budeson-Glycopyrrol-Formoterol (BREZTRI AEROSPHERE) 160-9-4.8 MCG/ACT AERO Inhale 2 puffs into the lungs in the morning and at bedtime. 10.7 g 0   diclofenac Sodium (VOLTAREN) 1 % GEL Apply 2 g topically 4 (four) times daily. (Patient taking differently: Apply 2 g topically in the morning and at  bedtime.)     docusate sodium (COLACE) 100 MG capsule Take 100 mg by mouth 2 (two) times daily as needed for moderate constipation.     escitalopram (LEXAPRO) 20 MG tablet Take 1 tablet (20 mg total) by mouth daily. 90 tablet 3   furosemide (LASIX) 20 MG tablet Take 80 mg by mouth daily.     JARDIANCE 10 MG TABS tablet TAKE ONE TABLET BY MOUTH EVERY DAY BEFORE BREAKFAST. 30 tablet 11   latanoprost (XALATAN) 0.005 % ophthalmic solution Place 1 drop into both eyes at bedtime.     lidocaine (LIDODERM) 5 % Place 1 patch onto the skin daily. Remove & Discard patch within 12 hours or as directed by MD 30 patch 0   loperamide (IMODIUM A-D) 2 MG tablet Take 2 mg by mouth 2 (two) times daily as needed for diarrhea or loose stools.     Melatonin 10 MG TABS Take 20 mg by mouth at bedtime.     metoprolol succinate (TOPROL-XL) 25 MG 24 hr tablet Take 0.5 tablets (12.5 mg total) by mouth daily. 45 tablet 3   montelukast (SINGULAIR) 10 MG tablet TAKE ONE TABLET BY MOUTH AT BEDTIME (Patient taking differently: Take 10 mg by mouth daily.) 28 tablet 5   omeprazole (PRILOSEC OTC) 20 MG tablet  Take 20 mg by mouth daily.     omeprazole (PRILOSEC) 20 MG capsule Take 20 mg by mouth daily.     Polyethyl Glycol-Propyl Glycol (SYSTANE ULTRA) 0.4-0.3 % SOLN Place 1 drop into both eyes daily as needed (dry eyes).     potassium chloride SA (KLOR-CON M) 20 MEQ tablet TAKE TWO TABLETS BY MOUTH IN THE MORNING AND TAKE ONE TABLET IN THE EVENING (Patient taking differently: Take 40 mEq by mouth daily.) 90 tablet 6   PRESCRIPTION MEDICATION Pt uses CPAP machine at bedtime     rosuvastatin (CRESTOR) 40 MG tablet TAKE ONE TABLET BY MOUTH EVERY DAY 90 tablet 3   senna-docusate (SENOKOT-S) 8.6-50 MG tablet Take 1 tablet by mouth daily.     spironolactone (ALDACTONE) 25 MG tablet Take 25 mg by mouth daily.     No current facility-administered medications on file prior to visit.     Review of Systems     Objective:  There were  no vitals filed for this visit. BP Readings from Last 3 Encounters:  08/26/23 (!) 162/80  07/31/23 (!) 142/80  07/18/23 (!) 124/58   Wt Readings from Last 3 Encounters:  07/18/23 168 lb 3.2 oz (76.3 kg)  06/13/23 168 lb (76.2 kg)  06/07/23 168 lb (76.2 kg)   There is no height or weight on file to calculate BMI.    Physical Exam     Lab Results  Component Value Date   WBC 6.7 08/26/2023   HGB 17.0 08/26/2023   HCT 50.0 08/26/2023   PLT 342 08/26/2023   GLUCOSE 127 (H) 08/26/2023   CHOL 105 08/29/2022   TRIG 146 08/29/2022   HDL 34 (L) 08/29/2022   LDLDIRECT 41.0 07/11/2021   LDLCALC 42 08/29/2022   ALT 11 08/26/2023   AST 22 08/26/2023   NA 137 08/26/2023   K 4.5 08/26/2023   CL 103 08/26/2023   CREATININE 1.60 (H) 08/26/2023   BUN 21 08/26/2023   CO2 20 (L) 08/26/2023   TSH 2.272 01/31/2023   PSA 1.53 01/15/2008   INR 1.1 08/26/2023   HGBA1C 6.6 (A) 01/14/2023   MICROALBUR 1.2 01/10/2022     Assessment & Plan:    See Problem List for Assessment and Plan of chronic medical problems.

## 2023-08-26 NOTE — ED Notes (Signed)
Help get patient on the monitor patient is resting with call bell in reach

## 2023-08-26 NOTE — ED Notes (Signed)
Pt in CT with this RN. Pt desat from 95% to 90% on RA. Scan paused and this RN placed pt on 3 L nasal cannula. O2 sats now 99% and scan restarted.

## 2023-08-26 NOTE — ED Provider Notes (Signed)
[place] EMERGENCY DEPARTMENT AT [facility] Provider Note   CSN: [id number] Arrival date & time: 08/26/23  4098     History  Chief Complaint  Patient presents with   Troy Cloud [name] is a 82 y.o. male.  Patient is an 82 year old male with a past medical history of A-fib on Eliquis, CHF, COPD, hypertension presenting to the emergency department after a fall.  The patient states that he was trying to stand up using his rolling walker this morning when he slipped and fell and hit the back of his head.  He is complaining of pain to the back of his head and his mid back to EMS and route.  The patient states he did not feel dizzy or have any chest pain or shortness of breath prior to the fall.  He denies any numbness or weakness, nausea or vomiting.  The history is provided by the patient and the EMS personnel.  Fall       Home Medications Prior to Admission medications   Medication Sig Start Date End Date Taking? Authorizing Provider  acetaminophen (TYLENOL) 325 MG tablet Take 650 mg by mouth every 6 (six) hours as needed for moderate pain or headache.   Yes [provider]  acetaminophen (TYLENOL) 500 MG tablet Take 1 tablet (500 mg total) by mouth every 6 (six) hours as needed. 08/26/23  Yes Theresia Lo, Benetta Spar K, DO  albuterol (VENTOLIN HFA) 108 (90 Base) MCG/ACT inhaler Inhale 2 puffs into the lungs every 6 (six) hours as needed for wheezing or shortness of breath.   Yes [provider]  amLODipine (NORVASC) 10 MG tablet Take 10 mg by mouth daily.   Yes [provider]  apixaban (ELIQUIS) 2.5 MG TABS tablet Take 1 tablet (2.5 mg total) by mouth 2 (two) times daily. Okay to restart this medication on 03/27/2023 03/26/23  Yes [name], MD  Brinzolamide-Brimonidine [facility]) 1-0.2 % SUSP Place 1 drop into both eyes 3 (three) times daily. 8a, 2p, 8p   Yes [provider]  Budeson-Glycopyrrol-Formoterol (BREZTRI  AEROSPHERE) 160-9-4.8 MCG/ACT AERO Inhale 2 puffs into the lungs in the morning and at bedtime. 07/27/21  Yes Icard, Bradley L, DO  diclofenac Sodium (VOLTAREN) 1 % GEL Apply 2 g topically 4 (four) times daily. Patient taking differently: Apply 2 g topically in the morning and at bedtime. 06/07/23  Yes Burns, Bobette Mo, MD  docusate sodium (COLACE) 100 MG capsule Take 100 mg by mouth 2 (two) times daily as needed for moderate constipation.   Yes [provider]  escitalopram (LEXAPRO) 20 MG tablet Take 1 tablet (20 mg total) by mouth daily. 06/07/23  Yes Burns, Bobette Mo, MD  furosemide (LASIX) 20 MG tablet Take 80 mg by mouth daily.   Yes [provider]  JARDIANCE 10 MG TABS tablet TAKE ONE TABLET BY MOUTH EVERY DAY BEFORE BREAKFAST. 01/08/22  Yes Laurey Morale, MD  latanoprost (XALATAN) 0.005 % ophthalmic solution Place 1 drop into both eyes at bedtime.   Yes [provider]  lidocaine (LIDODERM) 5 % Place 1 patch onto the skin daily. Remove & Discard patch within 12 hours or as directed by MD 08/26/23  Yes Theresia Lo, Cecile Sheerer, DO  loperamide (IMODIUM A-D) 2 MG tablet Take 2 mg by mouth 2 (two) times daily as needed for diarrhea or loose stools.   Yes [provider]  Melatonin 10 MG TABS Take 20 mg by mouth at bedtime.  Yes [provider]  metoprolol succinate (TOPROL-XL) 25 MG 24 hr tablet Take 0.5 tablets (12.5 mg total) by mouth daily. 04/30/23  Yes Laurey Morale, MD  montelukast (SINGULAIR) 10 MG tablet TAKE ONE TABLET BY MOUTH AT BEDTIME Patient taking differently: Take 10 mg by mouth daily. 08/20/22  Yes Burns, Bobette Mo, MD  omeprazole (PRILOSEC OTC) 20 MG tablet Take 20 mg by mouth daily.   Yes [provider]  omeprazole (PRILOSEC) 20 MG capsule Take 20 mg by mouth daily. 05/15/23  Yes [provider]  Polyethyl Glycol-Propyl Glycol (SYSTANE ULTRA) 0.4-0.3 % SOLN Place 1 drop into both eyes daily as needed (dry eyes).   Yes  [provider]  potassium chloride SA (KLOR-CON M) 20 MEQ tablet TAKE TWO TABLETS BY MOUTH IN THE MORNING AND TAKE ONE TABLET IN THE EVENING Patient taking differently: Take 40 mEq by mouth daily. 05/10/22  Yes Laurey Morale, MD  rosuvastatin (CRESTOR) 40 MG tablet TAKE ONE TABLET BY MOUTH EVERY DAY 02/09/22  Yes Laurey Morale, MD  senna-docusate (SENOKOT-S) 8.6-50 MG tablet Take 1 tablet by mouth daily.   Yes [provider]  spironolactone (ALDACTONE) 25 MG tablet Take 25 mg by mouth daily.   Yes [provider]  PRESCRIPTION MEDICATION Pt uses CPAP machine at bedtime    [provider]      Allergies    Niacin-lovastatin er, Penicillins, Sulfonamide derivatives, and Lipitor [atorvastatin]    Review of Systems   Review of Systems  Physical Exam Updated Vital Signs BP (!) 141/72 (BP Location: Right Arm)   Pulse (!) 54   Temp (!) 97.5 F (36.4 C) (Oral)   Resp 17   SpO2 99%  Physical Exam Vitals and nursing note reviewed.  Constitutional:      General: He is not in acute distress.    Appearance: Normal appearance.  HENT:     Head: Normocephalic and atraumatic.     Nose: Nose normal.     Mouth/Throat:     Mouth: Mucous membranes are moist.     Pharynx: Oropharynx is clear.  Eyes:     Extraocular Movements: Extraocular movements intact.     Conjunctiva/sclera: Conjunctivae normal.     Pupils: Pupils are equal, round, and reactive to light.  Neck:     Comments: No midline neck tenderness, c-collar in place Cardiovascular:     Rate and Rhythm: Regular rhythm. Bradycardia present.     Heart sounds: Normal heart sounds.  Pulmonary:     Effort: Pulmonary effort is normal.     Breath sounds: Normal breath sounds.  Abdominal:     General: Abdomen is flat.     Palpations: Abdomen is soft.     Tenderness: There is no abdominal tenderness.  Musculoskeletal:        General: Normal range of motion.     Comments: No midline back  tenderness Pelvis stable, nontender No bony tenderness to bilateral upper or lower extremities  Skin:    General: Skin is warm and dry.  Neurological:     General: No focal deficit present.     Mental Status: He is alert and oriented to person, place, and time.     Sensory: No sensory deficit.     Motor: No weakness.  Psychiatric:        Mood and Affect: Mood normal.        Behavior: Behavior normal.     ED Results / Procedures / Treatments  Labs (all labs ordered are listed, but only abnormal results are displayed) Labs Reviewed  COMPREHENSIVE METABOLIC PANEL - Abnormal; Notable for the following components:      Result Value   CO2 20 (*)    Glucose, Bld 129 (*)    Creatinine, Ser 1.42 (*)    GFR, Estimated 49 (*)    All other components within normal limits  CBC - Abnormal; Notable for the following components:   RDW 15.9 (*)    All other components within normal limits  URINALYSIS, ROUTINE W REFLEX MICROSCOPIC - Abnormal; Notable for the following components:   Glucose, UA >=500 (*)    Protein, ur 100 (*)    All other components within normal limits  I-STAT CHEM 8, ED - Abnormal; Notable for the following components:   Creatinine, Ser 1.60 (*)    Glucose, Bld 127 (*)    Calcium, Ion 1.06 (*)    All other components within normal limits  ETHANOL  PROTIME-INR  I-STAT CG4 LACTIC ACID, ED  SAMPLE TO BLOOD BANK    EKG EKG Interpretation Date/Time:  Monday August 26 2023 09:47:35 EST Ventricular Rate:  52 PR Interval:    QRS Duration:  127 QT Interval:  491 QTC Calculation: 457 R Axis:   130  Text Interpretation: Atrial fibrillation Right bundle branch block No significant change since last tracing Confirmed by Elayne Snare ([id number]) on 08/26/2023 10:04:57 AM  Radiology DG Pelvis Portable  Result Date: 08/26/2023 CLINICAL DATA:  Status post fall at home with back pain EXAM: PORTABLE PELVIS 1 VIEWS COMPARISON:  Pelvis radiograph dated 12/07/2022 FINDINGS:  There is no evidence of pelvic fracture or diastasis. No pelvic bone lesions are seen. Vascular calcifications. Surgical clips project over the left inguinal region. Surgical sutures project over the left hemipelvis. IMPRESSION: No radiographic finding of acute displaced fracture. Electronically Signed   By: Agustin Cree M.D.   On: 08/26/2023 10:21   CT CERVICAL SPINE WO CONTRAST  Result Date: 08/26/2023 CLINICAL DATA:  Polytrauma, blunt EXAM: CT CERVICAL SPINE WITHOUT CONTRAST TECHNIQUE: Multidetector CT imaging of the cervical spine was performed without intravenous contrast. Multiplanar CT image reconstructions were also generated. RADIATION DOSE REDUCTION: This exam was performed according to the departmental dose-optimization program which includes automated exposure control, adjustment of the mA and/or kV according to patient size and/or use of iterative reconstruction technique. COMPARISON:  12/07/2022 FINDINGS: Alignment: Facet joints are aligned without dislocation or traumatic listhesis. Dens and lateral masses are aligned. Skull base and vertebrae: Acute mild superior endplate compression fracture of the T3 vertebral body with 15-20% vertebral body height loss. No retropulsion. Vertebral bodies of the cervical spine are intact without fracture. No focal bone lesion. Soft tissues and spinal canal: No prevertebral fluid or swelling. No visible canal hematoma. Disc levels: Similar degree of advanced multilevel cervical spondylosis. Upper chest: 2.0 x 1.6 cm site of extrapleural nodularity at the apex of the right hemithorax (series 5, image 112), not appreciably changed compared to the recent prior CT. Other: None. IMPRESSION: 1. Acute mild superior endplate compression fracture of the T3 vertebral body. 2. No acute fracture or traumatic listhesis of the cervical spine. 3. 2.0 x 1.6 cm site of extrapleural nodularity at the apex of the right hemithorax, not appreciably changed compared to the recent prior  CT. Electronically Signed   By: Duanne Guess D.O.   On: 08/26/2023 10:20   CT HEAD WO CONTRAST  Result Date: 08/26/2023 CLINICAL DATA:  Head trauma, moderate-severe EXAM:  CT HEAD WITHOUT CONTRAST TECHNIQUE: Contiguous axial images were obtained from the base of the skull through the vertex without intravenous contrast. RADIATION DOSE REDUCTION: This exam was performed according to the departmental dose-optimization program which includes automated exposure control, adjustment of the mA and/or kV according to patient size and/or use of iterative reconstruction technique. COMPARISON:  12/07/2022 FINDINGS: Brain: No evidence of acute infarction, hemorrhage, hydrocephalus, extra-axial collection or mass lesion/mass effect. Patchy low-density changes within the periventricular and subcortical white matter most compatible with chronic microvascular ischemic change. Mild-moderate diffuse cerebral volume loss. Vascular: Atherosclerotic calcifications involving the large vessels of the skull base. No unexpected hyperdense vessel. Skull: Normal. Negative for fracture or focal lesion. Sinuses/Orbits: No acute finding. Other: None. IMPRESSION: 1. No acute intracranial findings. 2. Chronic microvascular ischemic change and cerebral volume loss. Electronically Signed   By: Duanne Guess D.O.   On: 08/26/2023 10:12   DG Chest Port 1 View  Result Date: 08/26/2023 CLINICAL DATA:  Trauma EXAM: PORTABLE CHEST 1 VIEW COMPARISON:  07/31/2023 FINDINGS: Left chest wall loop recorder. Normal heart size. Volume loss in the right lung. Similar chronic right-sided pleural effusion. Increasing bilateral perihilar and bibasilar opacities. No left-sided pleural effusion. No pneumothorax. IMPRESSION: 1. Increasing bilateral perihilar and bibasilar opacities, which may represent edema or infection. 2. Similar chronic right-sided pleural effusion. Electronically Signed   By: Duanne Guess D.O.   On: 08/26/2023 10:11     Procedures Procedures    Medications Ordered in ED Medications  furosemide (LASIX) injection 40 mg (has no administration in time range)  acetaminophen (TYLENOL) tablet 1,000 mg (has no administration in time range)  lidocaine (LIDODERM) 5 % 1-3 patch (has no administration in time range)    ED Course/ Medical Decision Making/ A&P Clinical Course as of 08/26/23 1215  Mon Aug 26, 2023  1024 Acute T3 compression fracture on CT C-spine, no other traumatic injury seen on imaging. [VK]  1126 CXR with pneumonia vs edema. Patient reports history of CHF with increased SOB recently, no fever or significant cough. Will be given dose of lasix. Neurosurgery paged for fracture recommendations. [VK]  1136 I spoke with Dr. Jordan Likes with neurosurgery who said no specific brace for this level of injury and can be discharged with light activity and follow up in a few weeks. [VK]  1214 Patient declined any narcotic pain control. He is stable for discharge home with outpatient follow up. [VK]    Clinical Course User Index [VK] Rexford Maus, DO                                 Medical Decision Making This patient presents to the ED with chief complaint(s) of fall with pertinent past medical history of A-fib on Eliquis, CHF, COPD, hypertension which further complicates the presenting complaint. The complaint involves an extensive differential diagnosis and also carries with it a high risk of complications and morbidity.    The differential diagnosis includes due to patient's age and blood thinner use concern for ICH, mass effect, cervical spine fracture, no other traumatic injury seen on exam, no presyncopal symptoms taking syncopal fall unlikely  Additional history obtained: Additional history obtained from EMS  Records reviewed Primary Care Documents  ED Course and Reassessment: On patient's arrival he is hemodynamically stable and rate controlled A-fib in no acute distress.  Patient was made a  prehospital arrival level 2 trauma due to his fall on  thinners.  Patient's primary survey was intact on arrival, no significant findings on secondary survey.  Patient will have chest and pelvis x-ray and CT of his head and C-spine.  He declined any pain control at this time and will be closely reassessed.  Independent labs interpretation:  The following labs were independently interpreted: within normal range  Independent visualization of imaging: - I independently visualized the following imaging with scope of interpretation limited to determining acute life threatening conditions related to emergency care: CTH/Cspine, CXR, which revealed T3 compression fracture, pulmonary edema on CXR  Consultation: - Consulted or discussed management/test interpretation w/ external professional: neurosurgery  Consideration for admission or further workup: Patient has no emergent conditions requiring admission or further work-up at this time and is stable for discharge home with primary care and neurosurgery follow-up  Social Determinants of health: N/A    Amount and/or Complexity of Data Reviewed Labs: ordered. Radiology: ordered.  Risk OTC drugs. Prescription drug management.          Final Clinical Impression(s) / ED Diagnoses Final diagnoses:  Fall in home, initial encounter  Closed wedge compression fracture of T3 vertebra, initial encounter (HCC)  Pleural effusion    Rx / DC Orders ED Discharge Orders          Ordered    acetaminophen (TYLENOL) 500 MG tablet  Every 6 hours PRN        08/26/23 1212    lidocaine (LIDODERM) 5 %  Every 24 hours        08/26/23 1212              Rexford Maus, DO 08/26/23 1215

## 2023-08-26 NOTE — Progress Notes (Signed)
   08/26/23 1049  Spiritual Encounters  Type of Visit Initial  Care provided to: Patient  Conversation partners present during encounter Nurse  Referral source Trauma page  Reason for visit Trauma  OnCall Visit No   Chaplain responding to Trauma 2 "Fall on Thiners" page.  Pt alert and responding to medical staff as they provide care.  Pt not available to speak with chaplain as he received care and was taken to CT.  No support person present.  Chaplain services remain available by Spiritual Consult or for emergent cases, paging [phone]  Chaplain Raelene Bott, MDiv [email or web address] [email or web address] [phone]

## 2023-08-27 ENCOUNTER — Encounter: Payer: Self-pay | Admitting: Internal Medicine

## 2023-08-27 ENCOUNTER — Ambulatory Visit (INDEPENDENT_AMBULATORY_CARE_PROVIDER_SITE_OTHER): Payer: PPO | Admitting: Internal Medicine

## 2023-08-27 VITALS — BP 122/68 | HR 80 | Temp 98.3°F | Ht 73.0 in

## 2023-08-27 DIAGNOSIS — S22030A Wedge compression fracture of third thoracic vertebra, initial encounter for closed fracture: Secondary | ICD-10-CM | POA: Diagnosis not present

## 2023-08-27 DIAGNOSIS — I5033 Acute on chronic diastolic (congestive) heart failure: Secondary | ICD-10-CM

## 2023-08-27 DIAGNOSIS — F3289 Other specified depressive episodes: Secondary | ICD-10-CM

## 2023-08-27 NOTE — Patient Instructions (Signed)
      Medications changes include :   None

## 2023-08-27 NOTE — Assessment & Plan Note (Signed)
Acute Has had some increased SOB, ? Cough CXR in ED showed possible HF - less likely infection Received extra dose of lasix 80 mg x 1 in ED yesterday Unable to determine if SOB is better Lungs sound clear I do not want to dehydrate him more - will just have his HHA and living facility monitor his SOB and cough for now Reviewed recent CT of lungs

## 2023-08-27 NOTE — Assessment & Plan Note (Signed)
Chronic Controlled  Continue Lexapro to 20 mg daily

## 2023-08-27 NOTE — Assessment & Plan Note (Signed)
Acute Pain management with standing tylenol and tylenol as needed for breakthrough pain Lidocaine patch If pain is not controlled we can consider tramadol

## 2023-08-28 ENCOUNTER — Inpatient Hospital Stay: Payer: PPO | Attending: Oncology | Admitting: Oncology

## 2023-08-28 DIAGNOSIS — C187 Malignant neoplasm of sigmoid colon: Secondary | ICD-10-CM | POA: Diagnosis not present

## 2023-08-28 DIAGNOSIS — Z85118 Personal history of other malignant neoplasm of bronchus and lung: Secondary | ICD-10-CM

## 2023-08-28 NOTE — Progress Notes (Signed)
[facility]   HEMATOLOGY- ONCOLOGY TeleHEALTH OFFICE VISIT PROGRESS NOTE  I connected with Troy [name] 08/28/23 at  8:30 AM EST by telephone and verified that I am speaking with the correct person using two identifiers.   I discussed the limitations, risks, security and privacy concerns of performing an evaluation and management service by telemedicine and the availability of in-person appointments. I also discussed with the patient that there may be a patient responsible charge related to this service. The patient expressed understanding and agreed to proceed.   Patient's location: Home Provider's location: Office   Diagnosis: Non-small cell lung cancer  INTERVAL HISTORY:   Mr Batz seen today for a telehealth visit per his request.  He fell 08/26/2023.  He was seen in the emergency room and was diagnosed with a T3 compression fracture.  He has discomfort at the back.  No other complaint.  No dyspnea.  Objective:   Lab Results:  Lab Results  Component Value Date   WBC 6.7 08/26/2023   HGB 17.0 08/26/2023   HCT 50.0 08/26/2023   MCV 90.2 08/26/2023   PLT 342 08/26/2023   NEUTROABS 4.8 07/31/2023    Imaging:  DG Pelvis Portable  Result Date: 08/26/2023 CLINICAL DATA:  Status post fall at home with back pain EXAM: PORTABLE PELVIS 1 VIEWS COMPARISON:  Pelvis radiograph dated 12/07/2022 FINDINGS: There is no evidence of pelvic fracture or diastasis. No pelvic bone lesions are seen. Vascular calcifications. Surgical clips project over the left inguinal region. Surgical sutures project over the left hemipelvis. IMPRESSION: No radiographic finding of acute displaced fracture. Electronically Signed   By: Agustin Cree M.D.   On: 08/26/2023 10:21   CT CERVICAL SPINE WO CONTRAST  Result Date: 08/26/2023 CLINICAL DATA:  Polytrauma, blunt EXAM: CT CERVICAL SPINE WITHOUT CONTRAST TECHNIQUE: Multidetector CT imaging of the cervical spine was performed without intravenous  contrast. Multiplanar CT image reconstructions were also generated. RADIATION DOSE REDUCTION: This exam was performed according to the departmental dose-optimization program which includes automated exposure control, adjustment of the mA and/or kV according to patient size and/or use of iterative reconstruction technique. COMPARISON:  12/07/2022 FINDINGS: Alignment: Facet joints are aligned without dislocation or traumatic listhesis. Dens and lateral masses are aligned. Skull base and vertebrae: Acute mild superior endplate compression fracture of the T3 vertebral body with 15-20% vertebral body height loss. No retropulsion. Vertebral bodies of the cervical spine are intact without fracture. No focal bone lesion. Soft tissues and spinal canal: No prevertebral fluid or swelling. No visible canal hematoma. Disc levels: Similar degree of advanced multilevel cervical spondylosis. Upper chest: 2.0 x 1.6 cm site of extrapleural nodularity at the apex of the right hemithorax (series 5, image 112), not appreciably changed compared to the recent prior CT. Other: None. IMPRESSION: 1. Acute mild superior endplate compression fracture of the T3 vertebral body. 2. No acute fracture or traumatic listhesis of the cervical spine. 3. 2.0 x 1.6 cm site of extrapleural nodularity at the apex of the right hemithorax, not appreciably changed compared to the recent prior CT. Electronically Signed   By: Duanne Guess D.O.   On: 08/26/2023 10:20   CT HEAD WO CONTRAST  Result Date: 08/26/2023 CLINICAL DATA:  Head trauma, moderate-severe EXAM: CT HEAD WITHOUT CONTRAST TECHNIQUE: Contiguous axial images were obtained from the base of the skull through the vertex without intravenous contrast. RADIATION DOSE REDUCTION: This exam was performed according to the departmental dose-optimization program which includes automated exposure control, adjustment of  the mA and/or kV according to patient size and/or use of iterative reconstruction  technique. COMPARISON:  12/07/2022 FINDINGS: Brain: No evidence of acute infarction, hemorrhage, hydrocephalus, extra-axial collection or mass lesion/mass effect. Patchy low-density changes within the periventricular and subcortical white matter most compatible with chronic microvascular ischemic change. Mild-moderate diffuse cerebral volume loss. Vascular: Atherosclerotic calcifications involving the large vessels of the skull base. No unexpected hyperdense vessel. Skull: Normal. Negative for fracture or focal lesion. Sinuses/Orbits: No acute finding. Other: None. IMPRESSION: 1. No acute intracranial findings. 2. Chronic microvascular ischemic change and cerebral volume loss. Electronically Signed   By: Duanne Guess D.O.   On: 08/26/2023 10:12   DG Chest Port 1 View  Result Date: 08/26/2023 CLINICAL DATA:  Trauma EXAM: PORTABLE CHEST 1 VIEW COMPARISON:  07/31/2023 FINDINGS: Left chest wall loop recorder. Normal heart size. Volume loss in the right lung. Similar chronic right-sided pleural effusion. Increasing bilateral perihilar and bibasilar opacities. No left-sided pleural effusion. No pneumothorax. IMPRESSION: 1. Increasing bilateral perihilar and bibasilar opacities, which may represent edema or infection. 2. Similar chronic right-sided pleural effusion. Electronically Signed   By: Duanne Guess D.O.   On: 08/26/2023 10:11    Medications: I have reviewed the patient's current medications.  Assessment/Plan: Adenocarcinoma of sigmoid colon-stage II (T3N0)  Colonoscopy 06/25/2018- multiple polyps in the ascending and descending colon, partially obstructing mass in the sigmoid colon-biopsy confirmed adenocarcinoma CTs 06/25/2018-enlarging right paratracheal lymph node compared to November 2018, no other evidence of metastatic disease Low anterior resection 12/12/2018, stage II (T3N0) adenocarcinoma the sigmoid colon, no loss of mismatch repair protein expression, MSI-stable Colonoscopy  06/23/2020-polyps removed from the transverse colon, ascending colon, and cecum-tubular adenomas  CT chest 01/14/2020-stable mildly enlarged high right paratracheal node and right hilar node CT abdomen/pelvis 01/29/2020-no evidence of recurrent disease, stable right lower lobe nodule CT chest 01/24/2021-mild growth of 2 irregular basilar right upper lobe nodules, stable high right mediastinal node PET scan 02/08/2021- band of nodules in the right upper lobe including a 13 mm nodule with an SUV of 3.2 and a 13 mm nodule with an SUV of 1.4.,  12 mm nodule in the upper right mediastinum with an SUV of 3.5      2.  History of iron deficiency anemia Secondary to bleeding from tumor, exacerbated by anticoagulation with eliquis  3. 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 Ewing Schlein at [facility]. Last Ct chest 05/01/18 stable, without concerning evidence of recurrence  -CT 06/25/2018- nonspecific small bilateral nodules, enlarging superior right paratracheal node -EUS biopsy of the right paratracheal nodes (several rounded nodes measuring 4 cm in total) on 07/10/2018- negative for malignancy -CT chest 03/03/2019- stable postoperative appearance of the chest, unchanged mildly prominent right paratracheal lymph node, no evidence of metastatic disease -CT chest 01/14/2020-stable surgical changes in the right chest, stable mild mediastinal and right hilar adenopathy -CT chest 01/24/2021- mild interval growth of irregular nodular opacities in the right upper lobe along the minor fissure -PET 02/08/2021-band of hypermetabolic nodules in the inferior aspect of the right upper lobe-indeterminate, nodularity increased over time.   Hypermetabolic nodule in the right upper mediastinum, similar small right effusion -CT super D chest 03/21/2021-consolidation and nodularity in the posterior inferior right upper lobe  slightly diminished, unchanged loculated small right effusion, fine clustered centrilobular nodularity concentrated in the left upper lobe-unchanged.  No enlarged mediastinal, hilar, or axillary nodes -Right pleural fluid 07/05/2021-reactive mesothelial cells -CT super D chest 09/12/2021-decrease in size of right paratracheal and left mediastinal nodes, stable small right pleural effusion, right lower lobectomy, similar soft tissue thickening in the inferior right upper lobe, upper lobe predominant centrilobular nodularity favored to represent bronchiolitis -CT super D chest 04/09/2022-increased nodular thickening at the minor fissure, increased masslike density in the inferior right upper lobe, stable right pleural effusion -PET 04/24/2022-enlarging bandlike opacity in the inferior right upper lobe low-level hypermetabolic activity-slightly increased from the previous PET, no hypermetabolic activity in the anterior nodularity, no evidence of metastatic disease, stable chronic loculated right pleural effusion -CT super D chest 07/20/2022-unchanged appearance of tracer avid bandlike opacity in the inferior right upper lobe-indeterminant, nodular area at the anterior basal right upper lobe unchanged -CT super D chest 02/05/2023-chronic right pleural effusion, progressive bandlike opacity in the right upper lobe along the minor fissure, progressive 16 mm right perifissural nodule -PET 03/25/2023-enlarging perifissural right lung nodule mildly hypermetabolic, no hypermetabolic activity within the progressive bandlike opacity in the posterior right upper lobe, new ill-defined parenchymal opacities in the right middle lobe-likely inflammatory, no evidence of distant metastatic disease -Bronchoscopy biopsy of right upper lobe nodule and  right lung bandlike thickening-adenocarcinoma and squamous cell carcinoma insufficient tissue for NGS testing, PD-L1 0% -CT chest 08/19/2023: Slight increase in size of the Burton fissural nodule at the minor fissure, bandlike opacity in the posterior right upper lobe-stable versus mildly improved, mild progression of small mediastinal nodes     4. COPD 5. Atrial fibrillation 6. Thyrotoxicosis-potentially amiodarone related 7.  Multiple gastric polyps on endoscopy 06/23/2020-1 oozing polyp-hyperplastic polyp, biopsy from the gastric antrum revealed intestinal metaplasia EGD 02/13/2022-gastric polyps-1 oozing polyp resected, nonbleeding duodenal ulcer with a visible vessel injected with epinephrine and treated with a heater probe, pathology revealed a hyperplastic gastric polyp 8.  Peripheral vascular disease 9.  Fall 2011 2524: T3 compression fracture      Disposition: Mr Steger is been diagnosed with adenocarcinoma and squamous of carcinoma involving right lung densities.  One of the lung lesions is slightly larger on the 08/19/2023 CT.  Small mediastinal lymph nodes are also slightly larger.  He appears asymptomatic.  There is no apparent new site of metastatic disease.  I discussed treatment options with Mr. Sermeno.  He has multiple comorbid conditions.  We discussed radiation.  He met with Dr. Roselind Messier in July.  Mr Denecke decided against radiation.  The PD-L1 score returned at 0% and there was insufficient tissue for NGS testing.  We decided to continue observation.  He will return for an office visit in 3 months.  We will have her repeat chest CT in 3-4 months   I discussed the assessment and treatment plan with the patient. The patient was provided an opportunity to ask questions and all were answered. The patient agreed with the plan and demonstrated an understanding of the instructions.   The patient was advised to call back or seek an in-person evaluation if the symptoms worsen or if the  condition fails to improve as anticipated.  I provided 20 minutes of telephone, chart review, and documentation time during this encounter, and > 50% was spent counseling as documented under my assessment & plan.  Thornton Papas MD  08/28/2023 8:48 AM

## 2023-08-30 DIAGNOSIS — R2681 Unsteadiness on feet: Secondary | ICD-10-CM | POA: Diagnosis not present

## 2023-08-30 DIAGNOSIS — K219 Gastro-esophageal reflux disease without esophagitis: Secondary | ICD-10-CM | POA: Diagnosis not present

## 2023-08-30 DIAGNOSIS — J449 Chronic obstructive pulmonary disease, unspecified: Secondary | ICD-10-CM | POA: Diagnosis not present

## 2023-08-30 DIAGNOSIS — I509 Heart failure, unspecified: Secondary | ICD-10-CM | POA: Diagnosis not present

## 2023-08-30 DIAGNOSIS — R531 Weakness: Secondary | ICD-10-CM | POA: Diagnosis not present

## 2023-09-02 ENCOUNTER — Inpatient Hospital Stay (HOSPITAL_COMMUNITY)
Admission: EM | Admit: 2023-09-02 | Discharge: 2023-09-07 | DRG: 193 | Disposition: A | Discharge status: Skilled Nursing Facility | Payer: PPO | Source: Skilled Nursing Facility | Attending: Internal Medicine | Admitting: Internal Medicine

## 2023-09-02 ENCOUNTER — Emergency Department (HOSPITAL_COMMUNITY): Payer: PPO

## 2023-09-02 ENCOUNTER — Other Ambulatory Visit: Payer: Self-pay

## 2023-09-02 ENCOUNTER — Encounter (HOSPITAL_COMMUNITY): Payer: Self-pay

## 2023-09-02 ENCOUNTER — Ambulatory Visit: Payer: PPO

## 2023-09-02 DIAGNOSIS — K219 Gastro-esophageal reflux disease without esophagitis: Secondary | ICD-10-CM | POA: Diagnosis present

## 2023-09-02 DIAGNOSIS — I739 Peripheral vascular disease, unspecified: Secondary | ICD-10-CM | POA: Diagnosis not present

## 2023-09-02 DIAGNOSIS — C4491 Basal cell carcinoma of skin, unspecified: Secondary | ICD-10-CM | POA: Diagnosis not present

## 2023-09-02 DIAGNOSIS — Z882 Allergy status to sulfonamides status: Secondary | ICD-10-CM

## 2023-09-02 DIAGNOSIS — H409 Unspecified glaucoma: Secondary | ICD-10-CM | POA: Diagnosis not present

## 2023-09-02 DIAGNOSIS — Z811 Family history of alcohol abuse and dependence: Secondary | ICD-10-CM

## 2023-09-02 DIAGNOSIS — C3431 Malignant neoplasm of lower lobe, right bronchus or lung: Secondary | ICD-10-CM | POA: Diagnosis not present

## 2023-09-02 DIAGNOSIS — Z8249 Family history of ischemic heart disease and other diseases of the circulatory system: Secondary | ICD-10-CM | POA: Diagnosis not present

## 2023-09-02 DIAGNOSIS — E876 Hypokalemia: Secondary | ICD-10-CM | POA: Diagnosis not present

## 2023-09-02 DIAGNOSIS — I130 Hypertensive heart and chronic kidney disease with heart failure and stage 1 through stage 4 chronic kidney disease, or unspecified chronic kidney disease: Secondary | ICD-10-CM | POA: Diagnosis not present

## 2023-09-02 DIAGNOSIS — J90 Pleural effusion, not elsewhere classified: Secondary | ICD-10-CM | POA: Diagnosis present

## 2023-09-02 DIAGNOSIS — R531 Weakness: Secondary | ICD-10-CM | POA: Diagnosis not present

## 2023-09-02 DIAGNOSIS — M6281 Muscle weakness (generalized): Secondary | ICD-10-CM | POA: Diagnosis not present

## 2023-09-02 DIAGNOSIS — Z823 Family history of stroke: Secondary | ICD-10-CM

## 2023-09-02 DIAGNOSIS — N400 Enlarged prostate without lower urinary tract symptoms: Secondary | ICD-10-CM | POA: Diagnosis not present

## 2023-09-02 DIAGNOSIS — B370 Candidal stomatitis: Secondary | ICD-10-CM | POA: Diagnosis not present

## 2023-09-02 DIAGNOSIS — Z888 Allergy status to other drugs, medicaments and biological substances status: Secondary | ICD-10-CM

## 2023-09-02 DIAGNOSIS — Z85118 Personal history of other malignant neoplasm of bronchus and lung: Secondary | ICD-10-CM

## 2023-09-02 DIAGNOSIS — F32A Depression, unspecified: Secondary | ICD-10-CM | POA: Diagnosis present

## 2023-09-02 DIAGNOSIS — R413 Other amnesia: Secondary | ICD-10-CM | POA: Diagnosis not present

## 2023-09-02 DIAGNOSIS — R112 Nausea with vomiting, unspecified: Secondary | ICD-10-CM | POA: Diagnosis not present

## 2023-09-02 DIAGNOSIS — Z880 Allergy status to penicillin: Secondary | ICD-10-CM

## 2023-09-02 DIAGNOSIS — Z7401 Bed confinement status: Secondary | ICD-10-CM | POA: Diagnosis not present

## 2023-09-02 DIAGNOSIS — N1832 Chronic kidney disease, stage 3b: Secondary | ICD-10-CM | POA: Diagnosis present

## 2023-09-02 DIAGNOSIS — C3490 Malignant neoplasm of unspecified part of unspecified bronchus or lung: Secondary | ICD-10-CM | POA: Diagnosis present

## 2023-09-02 DIAGNOSIS — E1122 Type 2 diabetes mellitus with diabetic chronic kidney disease: Secondary | ICD-10-CM | POA: Diagnosis present

## 2023-09-02 DIAGNOSIS — R0902 Hypoxemia: Principal | ICD-10-CM

## 2023-09-02 DIAGNOSIS — J9601 Acute respiratory failure with hypoxia: Secondary | ICD-10-CM | POA: Diagnosis present

## 2023-09-02 DIAGNOSIS — F419 Anxiety disorder, unspecified: Secondary | ICD-10-CM | POA: Diagnosis present

## 2023-09-02 DIAGNOSIS — Z7984 Long term (current) use of oral hypoglycemic drugs: Secondary | ICD-10-CM | POA: Diagnosis not present

## 2023-09-02 DIAGNOSIS — I480 Paroxysmal atrial fibrillation: Secondary | ICD-10-CM | POA: Diagnosis not present

## 2023-09-02 DIAGNOSIS — E785 Hyperlipidemia, unspecified: Secondary | ICD-10-CM | POA: Diagnosis present

## 2023-09-02 DIAGNOSIS — Z7901 Long term (current) use of anticoagulants: Secondary | ICD-10-CM

## 2023-09-02 DIAGNOSIS — R42 Dizziness and giddiness: Secondary | ICD-10-CM | POA: Diagnosis not present

## 2023-09-02 DIAGNOSIS — J449 Chronic obstructive pulmonary disease, unspecified: Secondary | ICD-10-CM | POA: Diagnosis present

## 2023-09-02 DIAGNOSIS — Z79899 Other long term (current) drug therapy: Secondary | ICD-10-CM | POA: Diagnosis not present

## 2023-09-02 DIAGNOSIS — I5032 Chronic diastolic (congestive) heart failure: Secondary | ICD-10-CM | POA: Diagnosis present

## 2023-09-02 DIAGNOSIS — J440 Chronic obstructive pulmonary disease with acute lower respiratory infection: Secondary | ICD-10-CM | POA: Diagnosis present

## 2023-09-02 DIAGNOSIS — I5033 Acute on chronic diastolic (congestive) heart failure: Secondary | ICD-10-CM | POA: Diagnosis not present

## 2023-09-02 DIAGNOSIS — C189 Malignant neoplasm of colon, unspecified: Secondary | ICD-10-CM | POA: Diagnosis not present

## 2023-09-02 DIAGNOSIS — N183 Chronic kidney disease, stage 3 unspecified: Secondary | ICD-10-CM | POA: Diagnosis present

## 2023-09-02 DIAGNOSIS — I10 Essential (primary) hypertension: Secondary | ICD-10-CM | POA: Diagnosis not present

## 2023-09-02 DIAGNOSIS — Z902 Acquired absence of lung [part of]: Secondary | ICD-10-CM

## 2023-09-02 DIAGNOSIS — E1165 Type 2 diabetes mellitus with hyperglycemia: Secondary | ICD-10-CM | POA: Diagnosis present

## 2023-09-02 DIAGNOSIS — R41841 Cognitive communication deficit: Secondary | ICD-10-CM | POA: Diagnosis not present

## 2023-09-02 DIAGNOSIS — H8110 Benign paroxysmal vertigo, unspecified ear: Secondary | ICD-10-CM | POA: Diagnosis not present

## 2023-09-02 DIAGNOSIS — J189 Pneumonia, unspecified organism: Secondary | ICD-10-CM | POA: Diagnosis not present

## 2023-09-02 DIAGNOSIS — D631 Anemia in chronic kidney disease: Secondary | ICD-10-CM | POA: Diagnosis not present

## 2023-09-02 DIAGNOSIS — Z1152 Encounter for screening for COVID-19: Secondary | ICD-10-CM

## 2023-09-02 DIAGNOSIS — E1151 Type 2 diabetes mellitus with diabetic peripheral angiopathy without gangrene: Secondary | ICD-10-CM | POA: Diagnosis not present

## 2023-09-02 DIAGNOSIS — Z860101 Personal history of adenomatous and serrated colon polyps: Secondary | ICD-10-CM

## 2023-09-02 DIAGNOSIS — I959 Hypotension, unspecified: Secondary | ICD-10-CM | POA: Diagnosis not present

## 2023-09-02 DIAGNOSIS — Z85828 Personal history of other malignant neoplasm of skin: Secondary | ICD-10-CM | POA: Diagnosis not present

## 2023-09-02 DIAGNOSIS — Z6821 Body mass index (BMI) 21.0-21.9, adult: Secondary | ICD-10-CM

## 2023-09-02 DIAGNOSIS — G4733 Obstructive sleep apnea (adult) (pediatric): Secondary | ICD-10-CM | POA: Diagnosis not present

## 2023-09-02 DIAGNOSIS — R1312 Dysphagia, oropharyngeal phase: Secondary | ICD-10-CM | POA: Diagnosis not present

## 2023-09-02 DIAGNOSIS — Z87891 Personal history of nicotine dependence: Secondary | ICD-10-CM

## 2023-09-02 DIAGNOSIS — Z803 Family history of malignant neoplasm of breast: Secondary | ICD-10-CM

## 2023-09-02 DIAGNOSIS — J309 Allergic rhinitis, unspecified: Secondary | ICD-10-CM | POA: Diagnosis not present

## 2023-09-02 DIAGNOSIS — Z66 Do not resuscitate: Secondary | ICD-10-CM | POA: Diagnosis present

## 2023-09-02 DIAGNOSIS — R4182 Altered mental status, unspecified: Secondary | ICD-10-CM | POA: Diagnosis not present

## 2023-09-02 DIAGNOSIS — E871 Hypo-osmolality and hyponatremia: Secondary | ICD-10-CM | POA: Diagnosis not present

## 2023-09-02 DIAGNOSIS — R1111 Vomiting without nausea: Secondary | ICD-10-CM | POA: Diagnosis not present

## 2023-09-02 DIAGNOSIS — Z85038 Personal history of other malignant neoplasm of large intestine: Secondary | ICD-10-CM

## 2023-09-02 DIAGNOSIS — R54 Age-related physical debility: Secondary | ICD-10-CM | POA: Diagnosis present

## 2023-09-02 DIAGNOSIS — I129 Hypertensive chronic kidney disease with stage 1 through stage 4 chronic kidney disease, or unspecified chronic kidney disease: Secondary | ICD-10-CM | POA: Diagnosis not present

## 2023-09-02 DIAGNOSIS — R251 Tremor, unspecified: Secondary | ICD-10-CM | POA: Diagnosis present

## 2023-09-02 DIAGNOSIS — R2689 Other abnormalities of gait and mobility: Secondary | ICD-10-CM | POA: Diagnosis not present

## 2023-09-02 HISTORY — DX: Nausea with vomiting, unspecified: R11.2

## 2023-09-02 LAB — CBC WITH DIFFERENTIAL/PLATELET
Abs Immature Granulocytes: 0.04 10*3/uL (ref 0.00–0.07)
Basophils Absolute: 0 10*3/uL (ref 0.0–0.1)
Basophils Relative: 0 %
Eosinophils Absolute: 0 10*3/uL (ref 0.0–0.5)
Eosinophils Relative: 0 %
HCT: 44.4 % (ref 39.0–52.0)
Hemoglobin: 15.1 g/dL (ref 13.0–17.0)
Immature Granulocytes: 0 %
Lymphocytes Relative: 3 %
Lymphs Abs: 0.3 10*3/uL — ABNORMAL LOW (ref 0.7–4.0)
MCH: 30.2 pg (ref 26.0–34.0)
MCHC: 34 g/dL (ref 30.0–36.0)
MCV: 88.8 fL (ref 80.0–100.0)
Monocytes Absolute: 0.3 10*3/uL (ref 0.1–1.0)
Monocytes Relative: 3 %
Neutro Abs: 9.9 10*3/uL — ABNORMAL HIGH (ref 1.7–7.7)
Neutrophils Relative %: 94 %
Platelets: 343 10*3/uL (ref 150–400)
RBC: 5 MIL/uL (ref 4.22–5.81)
RDW: 15.4 % (ref 11.5–15.5)
WBC: 10.5 10*3/uL (ref 4.0–10.5)
nRBC: 0 % (ref 0.0–0.2)

## 2023-09-02 LAB — COMPREHENSIVE METABOLIC PANEL
ALT: 13 U/L (ref 0–44)
AST: 16 U/L (ref 15–41)
Albumin: 3.8 g/dL (ref 3.5–5.0)
Alkaline Phosphatase: 89 U/L (ref 38–126)
Anion gap: 13 (ref 5–15)
BUN: 22 mg/dL (ref 8–23)
CO2: 22 mmol/L (ref 22–32)
Calcium: 9.3 mg/dL (ref 8.9–10.3)
Chloride: 100 mmol/L (ref 98–111)
Creatinine, Ser: 1.56 mg/dL — ABNORMAL HIGH (ref 0.61–1.24)
GFR, Estimated: 44 mL/min — ABNORMAL LOW (ref >60)
Glucose, Bld: 145 mg/dL — ABNORMAL HIGH (ref 70–99)
Potassium: 3.3 mmol/L — ABNORMAL LOW (ref 3.5–5.1)
Sodium: 135 mmol/L (ref 135–145)
Total Bilirubin: 1 mg/dL (ref <1.2)
Total Protein: 8.1 g/dL (ref 6.5–8.1)

## 2023-09-02 LAB — I-STAT CG4 LACTIC ACID, ED
Lactic Acid, Venous: 1.9 mmol/L (ref 0.5–1.9)
Lactic Acid, Venous: 1.5 mmol/L (ref 0.5–1.9)

## 2023-09-02 LAB — MAGNESIUM: Magnesium: 2.7 mg/dL — ABNORMAL HIGH (ref 1.7–2.4)

## 2023-09-02 LAB — PROTIME-INR
INR: 1.2 (ref 0.8–1.2)
Prothrombin Time: 15.9 s — ABNORMAL HIGH (ref 11.4–15.2)

## 2023-09-02 LAB — TROPONIN I (HIGH SENSITIVITY)
Troponin I (High Sensitivity): 18 ng/L — ABNORMAL HIGH (ref <18)
Troponin I (High Sensitivity): 22 ng/L — ABNORMAL HIGH (ref <18)

## 2023-09-02 LAB — BRAIN NATRIURETIC PEPTIDE: B Natriuretic Peptide: 212 pg/mL — ABNORMAL HIGH (ref 0.0–100.0)

## 2023-09-02 MED ORDER — LEVOFLOXACIN IN D5W 500 MG/100ML IV SOLN
500.0000 mg | INTRAVENOUS | Status: DC
  Administered 2023-09-02: 500 mg via INTRAVENOUS
  Filled 2023-09-02: qty 100

## 2023-09-02 MED ORDER — ALBUTEROL SULFATE HFA 108 (90 BASE) MCG/ACT IN AERS: 2.0000 | INHALATION_SPRAY | Freq: Four times a day (QID) | RESPIRATORY_TRACT | Status: DC | PRN

## 2023-09-02 MED ORDER — SPIRONOLACTONE 25 MG PO TABS
25.0000 mg | ORAL_TABLET | Freq: Every day | ORAL | Status: DC
  Administered 2023-09-03: 25 mg via ORAL
  Filled 2023-09-02: qty 1

## 2023-09-02 MED ORDER — METOPROLOL SUCCINATE ER 25 MG PO TB24
12.5000 mg | ORAL_TABLET | Freq: Every day | ORAL | Status: DC
  Administered 2023-09-03 – 2023-09-07 (×5): 12.5 mg via ORAL
  Filled 2023-09-02 (×5): qty 1

## 2023-09-02 MED ORDER — INSULIN ASPART 100 UNIT/ML IJ SOLN
0.0000 [IU] | Freq: Every day | INTRAMUSCULAR | Status: DC
  Administered 2023-09-06: 2 [IU] via SUBCUTANEOUS

## 2023-09-02 MED ORDER — APIXABAN 2.5 MG PO TABS
2.5000 mg | ORAL_TABLET | Freq: Two times a day (BID) | ORAL | Status: DC
  Administered 2023-09-03 – 2023-09-07 (×10): 2.5 mg via ORAL
  Filled 2023-09-02 (×10): qty 1

## 2023-09-02 MED ORDER — BUDESON-GLYCOPYRROL-FORMOTEROL 160-9-4.8 MCG/ACT IN AERO: 2.0000 | INHALATION_SPRAY | Freq: Two times a day (BID) | RESPIRATORY_TRACT | Status: DC

## 2023-09-02 MED ORDER — FUROSEMIDE 40 MG PO TABS
80.0000 mg | ORAL_TABLET | Freq: Every day | ORAL | Status: DC
  Administered 2023-09-03: 80 mg via ORAL
  Filled 2023-09-02: qty 2

## 2023-09-02 MED ORDER — POTASSIUM CHLORIDE 20 MEQ PO PACK
20.0000 meq | PACK | Freq: Once | ORAL | Status: AC
  Administered 2023-09-03: 20 meq via ORAL
  Filled 2023-09-02: qty 1

## 2023-09-02 MED ORDER — SENNOSIDES-DOCUSATE SODIUM 8.6-50 MG PO TABS
1.0000 | ORAL_TABLET | Freq: Every day | ORAL | Status: DC
  Administered 2023-09-03 – 2023-09-07 (×5): 1 via ORAL
  Filled 2023-09-02 (×5): qty 1

## 2023-09-02 MED ORDER — LEVOFLOXACIN IN D5W 750 MG/150ML IV SOLN: 750.0000 mg | INTRAVENOUS | Status: DC

## 2023-09-02 MED ORDER — MELATONIN 5 MG PO TABS
20.0000 mg | ORAL_TABLET | Freq: Every day | ORAL | Status: DC
  Administered 2023-09-03 – 2023-09-06 (×5): 20 mg via ORAL
  Filled 2023-09-02 (×5): qty 4

## 2023-09-02 MED ORDER — DICLOFENAC SODIUM 1 % EX GEL
2.0000 g | Freq: Two times a day (BID) | CUTANEOUS | Status: DC
  Administered 2023-09-03 – 2023-09-07 (×8): 2 g via TOPICAL
  Filled 2023-09-02: qty 100

## 2023-09-02 MED ORDER — MONTELUKAST SODIUM 10 MG PO TABS
10.0000 mg | ORAL_TABLET | Freq: Every day | ORAL | Status: DC
  Administered 2023-09-03 – 2023-09-06 (×4): 10 mg via ORAL
  Filled 2023-09-02 (×4): qty 1

## 2023-09-02 MED ORDER — ESCITALOPRAM OXALATE 10 MG PO TABS
20.0000 mg | ORAL_TABLET | Freq: Every day | ORAL | Status: DC
  Administered 2023-09-03 – 2023-09-07 (×5): 20 mg via ORAL
  Filled 2023-09-02 (×5): qty 2

## 2023-09-02 MED ORDER — ACETAMINOPHEN 650 MG RE SUPP: 650.0000 mg | Freq: Four times a day (QID) | RECTAL | Status: DC | PRN

## 2023-09-02 MED ORDER — INSULIN ASPART 100 UNIT/ML IJ SOLN
0.0000 [IU] | Freq: Three times a day (TID) | INTRAMUSCULAR | Status: DC
  Administered 2023-09-03 – 2023-09-06 (×5): 1 [IU] via SUBCUTANEOUS

## 2023-09-02 MED ORDER — ROSUVASTATIN CALCIUM 20 MG PO TABS
40.0000 mg | ORAL_TABLET | Freq: Every day | ORAL | Status: DC
  Administered 2023-09-03 – 2023-09-07 (×5): 40 mg via ORAL
  Filled 2023-09-02 (×5): qty 2

## 2023-09-02 MED ORDER — PANTOPRAZOLE SODIUM 40 MG PO TBEC
40.0000 mg | DELAYED_RELEASE_TABLET | Freq: Every day | ORAL | Status: DC
  Administered 2023-09-03 – 2023-09-07 (×5): 40 mg via ORAL
  Filled 2023-09-02 (×5): qty 1

## 2023-09-02 MED ORDER — BRIMONIDINE TARTRATE 0.2 % OP SOLN
1.0000 [drp] | Freq: Three times a day (TID) | OPHTHALMIC | Status: DC
  Administered 2023-09-03 – 2023-09-07 (×13): 1 [drp] via OPHTHALMIC
  Filled 2023-09-02: qty 5

## 2023-09-02 MED ORDER — AMLODIPINE BESYLATE 5 MG PO TABS
10.0000 mg | ORAL_TABLET | Freq: Every day | ORAL | Status: DC
  Administered 2023-09-03 – 2023-09-07 (×5): 10 mg via ORAL
  Filled 2023-09-02 (×5): qty 2

## 2023-09-02 MED ORDER — ACETAMINOPHEN 325 MG PO TABS: 650.0000 mg | ORAL_TABLET | Freq: Four times a day (QID) | ORAL | Status: DC | PRN

## 2023-09-02 MED ORDER — DOCUSATE SODIUM 100 MG PO CAPS: 100.0000 mg | ORAL_CAPSULE | Freq: Two times a day (BID) | ORAL | Status: DC | PRN

## 2023-09-02 MED ORDER — MORPHINE SULFATE (PF) 4 MG/ML IV SOLN
4.0000 mg | Freq: Once | INTRAVENOUS | Status: AC
  Administered 2023-09-02: 4 mg via INTRAVENOUS
  Filled 2023-09-02: qty 1

## 2023-09-02 MED ORDER — POTASSIUM CHLORIDE CRYS ER 20 MEQ PO TBCR
40.0000 meq | EXTENDED_RELEASE_TABLET | Freq: Every day | ORAL | Status: DC
  Administered 2023-09-03 – 2023-09-07 (×5): 40 meq via ORAL
  Filled 2023-09-02 (×5): qty 2

## 2023-09-02 MED ORDER — LIDOCAINE 5 % EX PTCH
1.0000 | MEDICATED_PATCH | CUTANEOUS | Status: DC
  Administered 2023-09-03 – 2023-09-07 (×5): 1 via TRANSDERMAL
  Filled 2023-09-02 (×4): qty 1

## 2023-09-02 MED ORDER — FUROSEMIDE 10 MG/ML IJ SOLN
40.0000 mg | Freq: Once | INTRAMUSCULAR | Status: AC
  Administered 2023-09-02: 40 mg via INTRAVENOUS
  Filled 2023-09-02: qty 4

## 2023-09-02 MED ORDER — ONDANSETRON HCL 4 MG/2ML IJ SOLN
4.0000 mg | Freq: Once | INTRAMUSCULAR | Status: AC
  Administered 2023-09-02: 4 mg via INTRAVENOUS
  Filled 2023-09-02: qty 2

## 2023-09-02 NOTE — ED Notes (Signed)
Patient had BM and was cleaned and bed linens changed.

## 2023-09-02 NOTE — ED Provider Notes (Signed)
[facility]GENC[facility] DEPARTMENT AT [facility] Provider Note   CSN: [id number] Arrival date & time: 09/02/23  1630     History  Chief Complaint  Patient presents with   Weakness   Emesis   Tremors    Troy Docken [name] is a 82 y.o. male.  HPI   82 year old male with past medical history of lung cancer pending treatment presents to the emergency department from his facility with general decline.  Patient is complaining of weakness, vomiting and shortness of breath.  He was noted to be hypoxic the facility and was requiring 3 L nasal cannula without history of supplemental requirement.  Patient had a doctor's visit about a week ago and was told he had fluid on the lungs but no medication adjustment was made.  Home Medications Prior to Admission medications   Medication Sig Start Date End Date Taking? Authorizing Provider  acetaminophen (TYLENOL) 325 MG tablet Take 650 mg by mouth every 6 (six) hours as needed for moderate pain or headache.    [provider]  acetaminophen (TYLENOL) 500 MG tablet Take 1 tablet (500 mg total) by mouth every 6 (six) hours as needed. 08/26/23   Elayne Snare K, DO  albuterol (VENTOLIN HFA) 108 (90 Base) MCG/ACT inhaler Inhale 2 puffs into the lungs every 6 (six) hours as needed for wheezing or shortness of breath.    [provider]  amLODipine (NORVASC) 10 MG tablet Take 10 mg by mouth daily.    [provider]  apixaban (ELIQUIS) 2.5 MG TABS tablet Take 1 tablet (2.5 mg total) by mouth 2 (two) times daily. Okay to restart this medication on 03/27/2023 03/26/23   [name], MD  Brinzolamide-Brimonidine [facility]) 1-0.2 % SUSP Place 1 drop into both eyes 3 (three) times daily. 8a, 2p, 8p    [provider]  Budeson-Glycopyrrol-Formoterol (BREZTRI AEROSPHERE) 160-9-4.8 MCG/ACT AERO Inhale 2 puffs into the lungs in the morning and at bedtime. 07/27/21   Josephine [name], DO  diclofenac Sodium  (VOLTAREN) 1 % GEL Apply 2 g topically 4 (four) times daily. Patient taking differently: Apply 2 g topically in the morning and at bedtime. 06/07/23   Pincus Sanes, MD  docusate sodium (COLACE) 100 MG capsule Take 100 mg by mouth 2 (two) times daily as needed for moderate constipation.    [provider]  escitalopram (LEXAPRO) 20 MG tablet Take 1 tablet (20 mg total) by mouth daily. 06/07/23   Pincus Sanes, MD  furosemide (LASIX) 20 MG tablet Take 80 mg by mouth daily.    [provider]  JARDIANCE 10 MG TABS tablet TAKE ONE TABLET BY MOUTH EVERY DAY BEFORE BREAKFAST. 01/08/22   Laurey Morale, MD  latanoprost (XALATAN) 0.005 % ophthalmic solution Place 1 drop into both eyes at bedtime.    [provider]  lidocaine (LIDODERM) 5 % Place 1 patch onto the skin daily. Remove & Discard patch within 12 hours or as directed by MD 08/26/23   Elayne Snare K, DO  loperamide (IMODIUM A-D) 2 MG tablet Take 2 mg by mouth 2 (two) times daily as needed for diarrhea or loose stools.    [provider]  Melatonin 10 MG TABS Take 20 mg by mouth at bedtime.    [provider]  metoprolol succinate (TOPROL-XL) 25 MG 24 hr tablet Take 0.5 tablets (12.5 mg total) by mouth daily. 04/30/23   Laurey Morale, MD  montelukast (SINGULAIR) 10 MG tablet  TAKE ONE TABLET BY MOUTH AT BEDTIME Patient taking differently: Take 10 mg by mouth daily. 08/20/22   Pincus Sanes, MD  omeprazole (PRILOSEC OTC) 20 MG tablet Take 20 mg by mouth daily.    [provider]  omeprazole (PRILOSEC) 20 MG capsule Take 20 mg by mouth daily. 05/15/23   [provider]  Polyethyl Glycol-Propyl Glycol (SYSTANE ULTRA) 0.4-0.3 % SOLN Place 1 drop into both eyes daily as needed (dry eyes).    [provider]  potassium chloride SA (KLOR-CON M) 20 MEQ tablet TAKE TWO TABLETS BY MOUTH IN THE MORNING AND TAKE ONE TABLET IN THE EVENING Patient taking differently: Take 40 mEq by  mouth daily. 05/10/22   Laurey Morale, MD  PRESCRIPTION MEDICATION Pt uses CPAP machine at bedtime    [provider]  rosuvastatin (CRESTOR) 40 MG tablet TAKE ONE TABLET BY MOUTH EVERY DAY 02/09/22   Laurey Morale, MD  senna-docusate (SENOKOT-S) 8.6-50 MG tablet Take 1 tablet by mouth daily.    [provider]  spironolactone (ALDACTONE) 25 MG tablet Take 25 mg by mouth daily.    [provider]      Allergies    Niacin-lovastatin er, Penicillins, Sulfonamide derivatives, and Lipitor [atorvastatin]    Review of Systems   Review of Systems  Constitutional:  Positive for chills and fever.  HENT:  Positive for congestion.   Respiratory:  Positive for cough, chest tightness and shortness of breath.   Cardiovascular:  Positive for chest pain and leg swelling.  Gastrointestinal:  Positive for nausea and vomiting. Negative for diarrhea.  Skin:  Negative for rash.  Neurological:  Negative for headaches.    Physical Exam Updated Vital Signs BP (!) 141/Troy [name]   Pulse 85   Temp 97.9 F (36.6 C) (Oral)   Resp (!) 25   Ht 6\' 1"  (1.854 m)   Wt 76.3 kg   SpO2 93%   BMI 22.19 kg/m  Physical Exam Vitals and nursing note reviewed.  Constitutional:      Appearance: Normal appearance. He is ill-appearing.  HENT:     Head: Normocephalic.     Mouth/Throat:     Mouth: Mucous membranes are moist.  Cardiovascular:     Rate and Rhythm: Normal rate.  Pulmonary:     Breath sounds: Rales present.     Comments: Requiring supplemental oxygen, tachypnea  Abdominal:     Palpations: Abdomen is soft.     Tenderness: There is no abdominal tenderness.  Skin:    General: Skin is warm.  Neurological:     Mental Status: He is alert and oriented to person, place, and time. Mental status is at baseline.  Psychiatric:        Mood and Affect: Mood normal.     ED Results / Procedures / Treatments   Labs (all labs ordered are listed, but only abnormal results are  displayed) Labs Reviewed  COMPREHENSIVE METABOLIC PANEL - Abnormal; Notable for the following components:      Result Value   Potassium 3.3 (*)    Glucose, Bld 145 (*)    Creatinine, Ser 1.56 (*)    GFR, Estimated 44 (*)    All other components within normal limits  CBC WITH DIFFERENTIAL/PLATELET - Abnormal; Notable for the following components:   Neutro Abs 9.9 (*)    Lymphs Abs 0.3 (*)    All other components within normal limits  PROTIME-INR - Abnormal; Notable for the following components:   Prothrombin  Time 15.9 (*)    All other components within normal limits  BRAIN NATRIURETIC PEPTIDE - Abnormal; Notable for the following components:   B Natriuretic Peptide 212.0 (*)    All other components within normal limits  MAGNESIUM - Abnormal; Notable for the following components:   Magnesium 2.7 (*)    All other components within normal limits  TROPONIN I (HIGH SENSITIVITY) - Abnormal; Notable for the following components:   Troponin I (High Sensitivity) 18 (*)    All other components within normal limits  CULTURE, BLOOD (ROUTINE X 2)  CULTURE, BLOOD (ROUTINE X 2)  RESP PANEL BY RT-PCR (RSV, FLU A&B, COVID)  [id number]  URINALYSIS, W/ REFLEX TO CULTURE (INFECTION SUSPECTED)  I-STAT CG4 LACTIC ACID, ED  I-STAT CG4 LACTIC ACID, ED    EKG EKG Interpretation Date/Time:  Monday September 02 2023 16:48:11 EST Ventricular Rate:  103 PR Interval:    QRS Duration:  120 QT Interval:  393 QTC Calculation: 515 R Axis:   165  Text Interpretation: Atrial fibrillation IRBBB and LPFB Nonspecific ST depression Confirmed by Coralee Pesa [phone]) on 09/02/2023 5:38:25 PM  Radiology No results found.  Procedures .Critical Care  Performed by: Rozelle Logan, DO Authorized by: Rozelle Logan, DO   Critical care provider statement:    Critical care time (minutes):  75   Critical care time was exclusive of:  Separately billable procedures and treating other patients   Critical care was  necessary to treat or prevent imminent or life-threatening deterioration of the following conditions:  Respiratory failure   Critical care was time spent personally by me on the following activities:  Development of treatment plan with patient or surrogate, discussions with consultants, evaluation of patient's response to treatment, examination of patient, ordering and review of laboratory studies, ordering and review of radiographic studies, ordering and performing treatments and interventions, pulse oximetry, re-evaluation of patient's condition and review of old charts   I assumed direction of critical care for this patient from another provider in my specialty: no     Care discussed with: admitting provider       Medications Ordered in ED Medications  morphine (PF) 4 MG/ML injection 4 mg (4 mg Intravenous Given 09/02/23 1832)  ondansetron (ZOFRAN) injection 4 mg (4 mg Intravenous Given 09/02/23 1831)    ED Course/ Medical Decision Making/ A&P                                 Medical Decision Making Amount and/or Complexity of Data Reviewed Labs: ordered. Radiology: ordered.  Risk Prescription drug management. Decision regarding hospitalization.   82 year old male presents the emergency department from facility with concern for hypoxia, vomiting, weakness.  Patient is currently being followed for new lung nodules, possible cancerous.  History of A-fib, anticoagulant Eliquis and noted to be compliant.  Also history of COPD and CHF.  Patient arrives on 3 L nasal cannula, I am unable to wean.  He at times is tachypneic but he is afebrile.  Blood pressure is stable.  Workup shows no leukocytosis, chemistry is around baseline.  Troponin is slightly elevated but flat, BNP is slightly elevated as well.  Respiratory panel is pending.  Lactic acid is normal.  Chest x-ray shows pleural effusion with questionable infiltrate.  Will treat with diuresis and antibiotics.  Last echo was in  2023.  Patient unable to wean to room air which is baseline.  Doubt  PE at this time given that he is compliance with Eliquis, will defer CT imaging to inpatient team.  Patients evaluation and results requires admission for further treatment and care.  Spoke with hospitalist, reviewed patient's ED course and they accept admission.  Patient agrees with admission plan, offers no new complaints and is stable/unchanged at time of admit.        Final Clinical Impression(s) / ED Diagnoses Final diagnoses:  None    Rx / DC Orders ED Discharge Orders     None         Rozelle Logan, DO 09/02/23 2256

## 2023-09-02 NOTE — H&P (Signed)
History and Physical    Troy [name] GNF:[id number] DOB: [birth year] DOA: 09/02/2023  PCP: Pincus Sanes, MD   Patient coming from: [place]   Chief Complaint: N/V, SOB, productive cough   HPI: Troy Connon [name] is a pleasant 82 y.o. 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  Past Medical History:  Diagnosis Date  . AAA (abdominal aortic aneurysm) (HCC) 10-20-17   MONITORED BY DR Edilia Bo, 3.7 CM by ABDOMINAL US 11/2021  . Anxiety   . Arthritis   . Atrial fibrillation (HCC)   . Basal cell carcinoma   . BPH (benign prostatic hypertrophy)   . Chronic diastolic heart failure (HCC)   . Chronic kidney disease    stage 3, pt unaware  . Colon cancer (HCC)   . COPD (chronic obstructive pulmonary disease) (HCC)   . Depression   . GERD (gastroesophageal reflux disease)   . Glaucoma BOTH EYES   Dr Nile Riggs  . History of lung cancer APRIL 2012   ONCOLOGIST- DR Mariel Sleet  LOV IN [facility] 10-27-2012 SQUAMOUS CELL---- S/P RIGHT LOWER LOBECTOMY AT [facility] -- NO CHEMORADIATION--- NO RECURRENCE  . Hx of adenomatous colonic polyps 2005    X 2; 1 hyperplastic polyp; Dr Juanda Chance  . Hyperlipidemia   . Hypertension   . Nausea & vomiting 09/02/2023  . OSA (obstructive sleep apnea) 08/29/2015   CPAP SET ON 10  . Peripheral vascular disease (HCC)    FOLLOWED  BY DR Edilia Bo  . Spinal stenosis 06/2014  . Thoracic aorta atherosclerosis (HCC)   . Thyrotoxicosis    amiodarone induced    Past Surgical History:  Procedure Laterality Date  . ABDOMINAL AORTOGRAM W/LOWER EXTREMITY N/A  12/20/2022   Procedure: ABDOMINAL AORTOGRAM W/LOWER EXTREMITY;  Surgeon: Cephus Shelling, MD;  Location: [facility] INVASIVE CV LAB;  Service: Cardiovascular;  Laterality: N/A;  . ANGIO PLASTY     X 4 in legs  . AORTOGRAM  07-27-2002   MILD DIFFUSE ILIAC ARTERY OCCLUSIVE DISEASE /  LEFT RENAL ARTERY 20%/ PATENT LEFT FEM-POP GRAFT/ MILD SFA AND POPLITEAL ARTERY OCCLUSIVE DISEASE W/ SEVERE KIDNEY OCCLUSIVE DISEASE  . ATRIAL FIBRILLATION ABLATION N/A 04/09/2019   Procedure: ATRIAL FIBRILLATION ABLATION;  Surgeon: Hillis Range, MD;  Location: [facility] INVASIVE CV LAB;  Service: Cardiovascular;  Laterality: N/A;  . ATRIAL FIBRILLATION ABLATION N/A 02/12/2020   Procedure: ATRIAL FIBRILLATION ABLATION;  Surgeon: Hillis Range, MD;  Location: [facility] INVASIVE CV LAB;  Service: Cardiovascular;  Laterality: N/A;  . BALLOON DILATION N/A 02/06/2022   Procedure: BALLOON Tamponade ampulla;  Surgeon: Lemar Lofty., MD;  Location: [facility] ENDOSCOPY;  Service: Gastroenterology;  Laterality: N/A;  . BASAL CELL CARCINOMA EXCISION     MULTIPLE TIMES--  RIGHT FOREARM, CHEEKS, AND BACK  . BIOPSY  06/25/2018   Procedure: BIOPSY;  Surgeon: Meridee Score Netty Starring., MD;  Location: [facility] ENDOSCOPY;  Service: Gastroenterology;;  . BIOPSY  02/06/2022   Procedure: BIOPSY;  Surgeon: Lemar Lofty., MD;  Location: [facility] ENDOSCOPY;  Service: Gastroenterology;;  . BRONCHIAL BIOPSY  03/26/2023   Procedure: BRONCHIAL BIOPSIES;  Surgeon: [name], MD;  Location: [facility] ENDOSCOPY;  Service: Pulmonary;;  . BRONCHIAL BRUSHINGS  03/26/2023   Procedure: BRONCHIAL BRUSHINGS;  Surgeon: [name], MD;  Location: [facility] ENDOSCOPY;  Service: Pulmonary;;  . BRONCHIAL NEEDLE ASPIRATION BIOPSY  03/26/2023   Procedure: BRONCHIAL NEEDLE ASPIRATION BIOPSIES;  Surgeon: [name], MD;  Location: [facility] ENDOSCOPY;  Service: Pulmonary;;  . BRONCHIAL WASHINGS  03/26/2023   Procedure: BRONCHIAL WASHINGS;  Surgeon: [name], MD;  Location: [facility] ENDOSCOPY;   Service: Pulmonary;;  . CARDIOVASCULAR STRESS TEST  12-08-2012  DR MCLEAN   NORMAL LEXISCAN WITH NO EXERCISE NUCLEAR STUDY/ EF 66%/   NO ISCHEMIA/ NO SIGNIFICANT CHANGE FROM PRIOR STUDY  . CARDIOVERSION N/A 02/14/2018   Procedure: CARDIOVERSION;  Surgeon: Laurey Morale, MD;  Location: [facility] ENDOSCOPY;  Service: Cardiovascular;  Laterality: N/A;  . CARDIOVERSION N/A 07/10/2022   Procedure: CARDIOVERSION;  Surgeon: Laurey Morale, MD;  Location: [facility] ENDOSCOPY;  Service: Cardiovascular;  Laterality: N/A;  . CAROTID ANGIOGRAM N/A 07/10/2013   Procedure: CAROTID ANGIOGRAM;  Surgeon: Sherren Kerns, MD;  Location: [facility] CATH LAB;  Service: Cardiovascular;  Laterality: N/A;  . CAROTID ENDARTERECTOMY Right 07-14-13   cea  . CATARACT EXTRACTION W/ INTRAOCULAR LENS  IMPLANT, BILATERAL    . colon polyectomy    . COLONOSCOPY    . COLONOSCOPY WITH PROPOFOL N/A 06/25/2018   Procedure: COLONOSCOPY WITH PROPOFOL;  Surgeon: Meridee Score Netty Starring., MD;  Location: [facility] ENDOSCOPY;  Service: Gastroenterology;  Laterality: N/A;  . CYSTOSCOPY W/ RETROGRADES Bilateral 01/21/2013   Procedure: CYSTOSCOPY WITH RETROGRADE PYELOGRAM;  Surgeon: Milford Cage, MD;  Location: [facility];  Service: Urology;  Laterality: Bilateral;   CYSTO, BLADDER BIOPSY, BILATERAL RETROGRADE PYELOGRAM  RAD TECH FROM RADIOLOGY PER JOY  . CYSTOSCOPY WITH BIOPSY N/A 01/21/2013   Procedure: CYSTOSCOPY WITH BIOPSY;  Surgeon: Milford Cage, MD;  Location: [facility];  Service: Urology;  Laterality: N/A;  . ENDARTERECTOMY Right 07/14/2013   Procedure: ENDARTERECTOMY CAROTID;  Surgeon: Chuck Hint, MD;  Location: [facility] OR;  Service: Vascular;  Laterality: Right;  . ENDOSCOPIC RETROGRADE CHOLANGIOPANCREATOGRAPHY (ERCP) WITH PROPOFOL N/A 02/06/2022   Procedure: ENDOSCOPIC RETROGRADE CHOLANGIOPANCREATOGRAPHY (ERCP) WITH PROPOFOL;  Surgeon: Lemar Lofty., MD;  Location: [facility] ENDOSCOPY;  Service:  Gastroenterology;  Laterality: N/A;  . EP IMPLANTABLE DEVICE N/A 08/12/2015   Procedure: Loop Recorder Insertion;  Surgeon: Hillis Range, MD;  [facility]: [facility] INVASIVE CV LAB;  Service: Cardiovascular;  Laterality: N/A;  . ESOPHAGOGASTRODUODENOSCOPY Left 02/13/2022   Procedure: ESOPHAGOGASTRODUODENOSCOPY (EGD);  Surgeon: Shellia Cleverly, DO;  Location: [facility] ENDOSCOPY;  Service: Gastroenterology;  Laterality: Left;  . ESOPHAGOGASTRODUODENOSCOPY (EGD) WITH PROPOFOL N/A 06/25/2018   Procedure: ESOPHAGOGASTRODUODENOSCOPY (EGD) WITH PROPOFOL;  Surgeon: Meridee Score Netty Starring., MD;  Location: [facility] ENDOSCOPY;  Service: Gastroenterology;  Laterality: N/A;  . ESOPHAGOGASTRODUODENOSCOPY (EGD) WITH PROPOFOL N/A 07/10/2018   Procedure: ESOPHAGOGASTRODUODENOSCOPY (EGD) WITH PROPOFOL;  Surgeon: Rachael Fee, MD;  Location: [facility] ENDOSCOPY;  Service: Endoscopy;  Laterality: N/A;  . EUS N/A 07/10/2018   Procedure: UPPER ENDOSCOPIC ULTRASOUND (EUS) RADIAL;  Surgeon: Rachael Fee, MD;  Location: [facility] ENDOSCOPY;  Service: Endoscopy;  Laterality: N/A;  . EYE SURGERY Right   . FEMORAL-POPLITEAL BYPASS GRAFT Left 1994  [facility]   AND 2001  IN [place]  . FEMORAL-POPLITEAL BYPASS GRAFT Left 08/30/2015   Procedure: REVISION OF BYPASS GRAFT Left  FEMORAL-POPLITEAL ARTERY;  Surgeon: Chuck Hint, MD;  Location: [facility] OR;  Service: Vascular;  Laterality: Left;  . FINE  NEEDLE ASPIRATION N/A 07/10/2018   Procedure: FINE NEEDLE ASPIRATION (FNA) LINEAR;  Surgeon: Rachael Fee, MD;  Location: [facility] ENDOSCOPY;  Service: Endoscopy;  Laterality: N/A;  . FLEXIBLE SIGMOIDOSCOPY N/A 12/12/2018   Procedure: FLEXIBLE SIGMOIDOSCOPY;  Surgeon: Andria Meuse, MD;  Location: [facility] ORS;  Service: General;  Laterality: N/A;  . HEMOSTASIS CLIP PLACEMENT  02/13/2022   Procedure: HEMOSTASIS CLIP PLACEMENT;  Surgeon: Shellia Cleverly, DO;  Location: [facility] ENDOSCOPY;  Service: Gastroenterology;;  . HEMOSTASIS CONTROL  03/26/2023    Procedure: HEMOSTASIS CONTROL;  Surgeon: [name], MD;  Location: [facility] ENDOSCOPY;  Service: Pulmonary;;  . HOT HEMOSTASIS N/A 02/13/2022   Procedure: HOT HEMOSTASIS (ARGON PLASMA COAGULATION/BICAP);  Surgeon: Shellia Cleverly, DO;  Location: [facility] ENDOSCOPY;  Service: Gastroenterology;  Laterality: N/A;  . LARYNGOSCOPY  06-27-2004   BX VOCAL CORD  (LEUKOPLAKIA)  PER PT NO ISSUES SINCE  . LEFT HEART CATH AND CORONARY ANGIOGRAPHY N/A 08/20/2017   Procedure: LEFT HEART CATH AND CORONARY ANGIOGRAPHY;  Surgeon: Laurey Morale, MD;  Location: [facility] INVASIVE CV LAB;  Service: Cardiovascular;  Laterality: N/A;  . LOOP RECORDER INSERTION N/A 04/09/2019   Procedure: LOOP RECORDER INSERTION;  Surgeon: Hillis Range, MD;  Location: [facility] INVASIVE CV LAB;  Service: Cardiovascular;  Laterality: N/A;  . LOOP RECORDER REMOVAL N/A 04/09/2019   Procedure: LOOP RECORDER REMOVAL;  Surgeon: Hillis Range, MD;  Location: [facility] INVASIVE CV LAB;  Service: Cardiovascular;  Laterality: N/A;  . LOWER EXTREMITY ANGIOGRAM Bilateral 08/29/2015   Procedure: Lower Extremity Angiogram;  Surgeon: Chuck Hint, MD;  Location: [facility] INVASIVE CV LAB;  Service: Cardiovascular;  Laterality: Bilateral;  . LUNG LOBECTOMY  01/24/2011    RIGHT UPPER LOBE  (SQUAMOUS CELL CARCINOMA) Dr Desmond Lope , [facility]. No chemotherapyor radiation  . PANCREATIC STENT PLACEMENT  02/06/2022   Procedure: PANCREATIC STENT PLACEMENT;  Surgeon: Meridee Score Netty Starring., MD;  Location: [facility] ENDOSCOPY;  Service: Gastroenterology;;  . Vella Redhead ANGIOPLASTY Right 07/14/2013   Procedure: PATCH ANGIOPLASTY;  Surgeon: Chuck Hint, MD;  Location: [facility] OR;  Service: Vascular;  Laterality: Right;  . PATCH ANGIOPLASTY Left 08/30/2015   Procedure: VEIN PATCH ANGIOPLASTY OF PROXIMAL Left BYPASS GRAFT;  Surgeon: Chuck Hint, MD;  Location: [facility] OR;  Service: Vascular;  Laterality: Left;  . PERIPHERAL VASCULAR CATHETERIZATION N/A 08/29/2015   Procedure: Abdominal Aortogram;   Surgeon: Chuck Hint, MD;  Location: [facility] INVASIVE CV LAB;  Service: Cardiovascular;  Laterality: N/A;  . POLYPECTOMY  06/25/2018   Procedure: POLYPECTOMY;  Surgeon: Meridee Score Netty Starring., MD;  Location: [facility] ENDOSCOPY;  Service: Gastroenterology;;  . POLYPECTOMY    . POLYPECTOMY  02/13/2022   Procedure: POLYPECTOMY;  Surgeon: Shellia Cleverly, DO;  Location: [facility] ENDOSCOPY;  Service: Gastroenterology;;  . REMOVAL OF STONES  02/06/2022   Procedure: REMOVAL OF STONES;  Surgeon: Lemar Lofty., MD;  Location: [facility] ENDOSCOPY;  Service: Gastroenterology;;  . Susa Day  02/13/2022   Procedure: Susa Day;  Surgeon: Shellia Cleverly, DO;  Location: [facility] ENDOSCOPY;  Service: Gastroenterology;;  . Dennison Mascot  02/06/2022   Procedure: SPHINCTEROTOMY;  Surgeon: Lemar Lofty., MD;  Location: [facility] ENDOSCOPY;  Service: Gastroenterology;;  . Sunnie Nielsen INJECTION  06/25/2018   Procedure: SUBMUCOSAL INJECTION;  Surgeon: Lemar Lofty., MD;  Location: [facility] ENDOSCOPY;  Service: Gastroenterology;;  . TEE WITHOUT CARDIOVERSION N/A 02/14/2018   Procedure: TRANSESOPHAGEAL ECHOCARDIOGRAM (TEE);  Surgeon: Laurey Morale, MD;  Location: [facility] ENDOSCOPY;  Service: Cardiovascular;  Laterality: N/A;  . trabecular surgery  OS  . TRANSTHORACIC ECHOCARDIOGRAM  12-29-2012  DR [facility]   MILD LVH/  LVSF NORMAL/ EF 55-60%/  GRADE I DIASTOLIC DYSFUNCTION  . VIDEO BRONCHOSCOPY WITH RADIAL ENDOBRONCHIAL ULTRASOUND  03/26/2023   Procedure: VIDEO BRONCHOSCOPY WITH RADIAL ENDOBRONCHIAL ULTRASOUND;  Surgeon: [name], MD;  Location: [facility] ENDOSCOPY;  Service: Pulmonary;;    Social History:   reports that he quit smoking about 9 years ago. His smoking use included cigarettes. He started smoking about 59 years ago. He has a 25 pack-year smoking history. He has never been exposed to tobacco smoke. He has never used smokeless tobacco. He reports current alcohol use of about 4.0 standard drinks of  alcohol per week. He reports that he does not use drugs.  Allergies  Allergen Reactions  . Niacin-Lovastatin Er Shortness Of Breath and Other (See Comments)    Dyspnea Flushing  *Not listed on MAR   . Penicillins Hives, Shortness Of Breath and Other (See Comments)    Dyspnea Flushing Because of a history of documented adverse serious drug reaction;Medi Alert bracelet  is recommended PCN reaction causing immediate rash, facial/tongue/throat swelling, SOB or lightheadedness with hypotension: Yes PCN reaction causing severe rash involving mucus membranes or skin necrosis: No PCN reaction occurring within the last 10 years: NO PCN reaction that required hospitalization: NO  . Sulfonamide Derivatives Hives, Shortness Of Breath and Other (See Comments)    Dyspnea  Flushing Because of a history of documented adverse serious drug reaction;Medi Alert bracelet  is recommended  . Lipitor [Atorvastatin] Other (See Comments)    Myalgias Arthralgias   *Not listed on MAR     Family History  Problem Relation Age of Onset  . Stroke Mother        mini strokes  . Alcohol abuse Father   . Heart disease Father        MI after 43  . Stroke Father   . Hypertension Father   . Heart attack Father   . Heart disease Paternal Aunt        several  . Hypertension Paternal Aunt        several  . Stroke Paternal Aunt        several  . Stroke Paternal Uncle        several  . Heart disease Paternal Uncle        several;2 had MI pre 68  . Cancer Daughter 64       breast ca, also with benign sessile polyp   . Colon cancer Daughter 39  . Colon polyps Neg Hx   . Esophageal cancer Neg Hx   . Rectal cancer Neg Hx   . Stomach cancer Neg Hx      Prior to Admission medications   Medication Sig Start Date End Date Taking? Authorizing Provider  acetaminophen (TYLENOL) 325 MG tablet Take 650 mg by mouth every 6 (six) hours as needed for moderate pain or headache.   Yes [provider]   acetaminophen (TYLENOL) 500 MG tablet Take 1 tablet (500 mg total) by mouth every 6 (six) hours as needed. 08/26/23  Yes Theresia Lo, Benetta Spar K, DO  albuterol (VENTOLIN HFA) 108 (90 Base) MCG/ACT inhaler Inhale 2 puffs into the lungs every 6 (six) hours as needed for wheezing or shortness of breath.   Yes [provider]  amLODipine (NORVASC) 10 MG tablet Take 10 mg by mouth daily.   Yes [provider]  apixaban (ELIQUIS) 2.5 MG TABS tablet Take 1 tablet (2.5  mg total) by mouth 2 (two) times daily. Okay to restart this medication on 03/27/2023 03/26/23  Yes [name], MD  Brinzolamide-Brimonidine [facility]) 1-0.2 % SUSP Place 1 drop into both eyes 3 (three) times daily. 8a, 2p, 8p   Yes [provider]  Budeson-Glycopyrrol-Formoterol (BREZTRI AEROSPHERE) 160-9-4.8 MCG/ACT AERO Inhale 2 puffs into the lungs in the morning and at bedtime. 07/27/21  Yes Icard, Bradley L, DO  diclofenac Sodium (VOLTAREN) 1 % GEL Apply 2 g topically 4 (four) times daily. Patient taking differently: Apply 2 g topically in the morning and at bedtime. 06/07/23  Yes Burns, Bobette Mo, MD  docusate sodium (COLACE) 100 MG capsule Take 100 mg by mouth 2 (two) times daily as needed for moderate constipation.   Yes [provider]  escitalopram (LEXAPRO) 20 MG tablet Take 1 tablet (20 mg total) by mouth daily. 06/07/23  Yes Burns, Bobette Mo, MD  furosemide (LASIX) 20 MG tablet Take 80 mg by mouth daily.   Yes [provider]  JARDIANCE 10 MG TABS tablet TAKE ONE TABLET BY MOUTH EVERY DAY BEFORE BREAKFAST. 01/08/22  Yes Laurey Morale, MD  latanoprost (XALATAN) 0.005 % ophthalmic solution Place 1 drop into both eyes at bedtime.   Yes [provider]  lidocaine (LIDODERM) 5 % Place 1 patch onto the skin daily. Remove & Discard patch within 12 hours or as directed by MD 08/26/23  Yes Theresia Lo, Cecile Sheerer, DO  loperamide (IMODIUM A-D) 2 MG tablet Take 2 mg by mouth 2 (two) times daily  as needed for diarrhea or loose stools.   Yes [provider]  Melatonin 10 MG TABS Take 20 mg by mouth at bedtime.   Yes [provider]  metoprolol succinate (TOPROL-XL) 25 MG 24 hr tablet Take 0.5 tablets (12.5 mg total) by mouth daily. 04/30/23  Yes Laurey Morale, MD  montelukast (SINGULAIR) 10 MG tablet TAKE ONE TABLET BY MOUTH AT BEDTIME Patient taking differently: Take 10 mg by mouth daily. 08/20/22  Yes Burns, Bobette Mo, MD  omeprazole (PRILOSEC) 20 MG capsule Take 20 mg by mouth daily. 05/15/23  Yes [provider]  Polyethyl Glycol-Propyl Glycol (SYSTANE ULTRA) 0.4-0.3 % SOLN Place 1 drop into both eyes daily as needed (dry eyes).   Yes [provider]  potassium chloride SA (KLOR-CON M) 20 MEQ tablet TAKE TWO TABLETS BY MOUTH IN THE MORNING AND TAKE ONE TABLET IN THE EVENING Patient taking differently: Take 40 mEq by mouth daily. 05/10/22  Yes Laurey Morale, MD  rosuvastatin (CRESTOR) 40 MG tablet TAKE ONE TABLET BY MOUTH EVERY DAY 02/09/22  Yes Laurey Morale, MD  senna-docusate (SENOKOT-S) 8.6-50 MG tablet Take 1 tablet by mouth daily.   Yes [provider]  spironolactone (ALDACTONE) 25 MG tablet Take 25 mg by mouth daily.   Yes [provider]  PRESCRIPTION MEDICATION Pt uses CPAP machine at bedtime    [provider]    Physical Exam: Vitals:   09/02/23 2130 09/02/23 2145 09/02/23 2200 09/02/23 2204  BP: 123/68  (!) 140/72   Pulse: 80 77 77   Resp: 18 18 (!) 24   Temp:    97.6 F (36.4 C)  TempSrc:    Oral  SpO2: 90% 91% (!) 86%   Weight:      Height:         Constitutional: NAD, calm  Eyes: PERTLA, lids and conjunctivae normal ENMT: Mucous membranes are moist. Posterior pharynx clear of any  exudate or lesions.   Neck: supple, no masses  Respiratory: clear to auscultation bilaterally, no wheezing, no crackles. No accessory muscle use.  Cardiovascular: S1 & S2 heard, regular rate and rhythm. No  extremity edema. No significant JVD. Abdomen: No distension, no tenderness, soft. Bowel sounds active.  Musculoskeletal: no clubbing / cyanosis. No joint deformity upper and lower extremities.   Skin: no significant rashes, lesions, ulcers. Warm, dry, well-perfused. Neurologic: CN 2-12 grossly intact. Sensation intact, DTR normal. Strength 5/5 in all 4 limbs. Alert and oriented.  Psychiatric: Pleasant. Cooperative.    Labs and Imaging on Admission: I have personally reviewed following labs and imaging studies  CBC: Recent Labs  Lab 09/02/23 1730  WBC 10.5  NEUTROABS 9.9*  HGB 15.1  HCT 44.4  MCV 88.8  PLT 343   Basic Metabolic Panel: Recent Labs  Lab 09/02/23 1730  NA 135  K 3.3*  CL 100  CO2 22  GLUCOSE 145*  BUN 22  CREATININE 1.56*  CALCIUM 9.3  MG 2.7*   GFR: Estimated Creatinine Clearance: 39.4 mL/min (A) (by C-G formula based on SCr of 1.56 mg/dL (H)). Liver Function Tests: Recent Labs  Lab 09/02/23 1730  AST 16  ALT 13  ALKPHOS 89  BILITOT 1.0  PROT 8.1  ALBUMIN 3.8   No results for input(s): "LIPASE", "AMYLASE" in the last 168 hours. No results for input(s): "AMMONIA" in the last 168 hours. Coagulation Profile: Recent Labs  Lab 09/02/23 1730  INR 1.2   Cardiac Enzymes: No results for input(s): "CKTOTAL", "CKMB", "CKMBINDEX", "TROPONINI" in the last 168 hours. BNP (last 3 results) Recent Labs    07/31/23 1201  PROBNP 211.0*   HbA1C: No results for input(s): "HGBA1C" in the last 72 hours. CBG: No results for input(s): "GLUCAP" in the last 168 hours. Lipid Profile: No results for input(s): "CHOL", "HDL", "LDLCALC", "TRIG", "CHOLHDL", "LDLDIRECT" in the last 72 hours. Thyroid Function Tests: No results for input(s): "TSH", "T4TOTAL", "FREET4", "T3FREE", "THYROIDAB" in the last 72 hours. Anemia Panel: No results for input(s): "VITAMINB12", "FOLATE", "FERRITIN", "TIBC", "IRON", "RETICCTPCT" in the last 72 hours. Urine analysis:     Component Value Date/Time   COLORURINE YELLOW 08/26/2023 0937   APPEARANCEUR CLEAR 08/26/2023 0937   LABSPEC 1.021 08/26/2023 0937   PHURINE 6.0 08/26/2023 0937   GLUCOSEU >=500 (A) 08/26/2023 0937   HGBUR NEGATIVE 08/26/2023 0937   BILIRUBINUR NEGATIVE 08/26/2023 0937   KETONESUR NEGATIVE 08/26/2023 0937   PROTEINUR 100 (A) 08/26/2023 0937   UROBILINOGEN 1.0 07/10/2013 1500   NITRITE NEGATIVE 08/26/2023 0937   LEUKOCYTESUR NEGATIVE 08/26/2023 0937   Sepsis Labs: @LABRCNTIP (procalcitonin:4,lacticidven:4) )No results found for this or any previous visit (from the past 240 hour(s)).   Radiological Exams on Admission: DG Chest 2 View  Result Date: 09/02/2023 CLINICAL DATA:  Suspected sepsis. New onset vomiting, weakness, and tremors. History of lung cancer not on treatment. EXAM: CHEST - 2 VIEW COMPARISON:  08/26/2023 FINDINGS: Shallow inspiration. Heart size and pulmonary vascularity are likely normal for technique. There is infiltration or atelectasis in the right mid lung with small right pleural effusion. This appears similar to the prior study and could indicate atelectasis or pneumonia. Mild perihilar infiltration bilaterally may represent edema. No pneumothorax. Mediastinal contours appear intact. IMPRESSION: 1. Shallow inspiration. 2. Persistent small right pleural effusion with infiltration in the right lower lung, possibly infection or atelectasis. 3. Perihilar infiltration may indicate superimposed edema Electronically Signed   By: Burman Nieves M.D.   On: 09/02/2023 21:36  EKG: Independently reviewed. ***  Assessment/Plan Principal Problem:   Pneumonia Active Problems:   Colon adenocarcinoma (HCC)   Squamous cell lung cancer (HCC)   PAD (peripheral artery disease) (HCC)   Chronic renal disease, stage 3, moderately decreased glomerular filtration rate between 30-59 mL/min/1.73 square meter (HCC)   COPD GOLD II    PAF (paroxysmal atrial fibrillation) (HCC)   OSA  (obstructive sleep apnea)   Benign essential HTN   Chronic diastolic CHF (congestive heart failure) (HCC)   Pleural effusion on right   Hypokalemia   Acute respiratory failure with hypoxia (HCC)   Nausea & vomiting    1.  -  -  -  -  -  -  -  -   2.  -  -  -  -  -  -  -   3.  -  -  -  -  -  -  -   4.  -  -  -  -  -  -  -  -   5.  -  -  -  -  -  -  -   6.  -  -  -  -  -   7.  -  -  -  -  -  -   8.  -  -  -  -  -  -     DVT prophylaxis: ***  Code Status: ***  Level of Care: Level of care: Telemetry Family Communication: ***  Disposition Plan:  Patient is from: ***  Anticipated d/c is to: *** Anticipated d/c date is: *** Patient currently: ***  Consults called: ***  Admission status: ***    Briscoe Deutscher, MD [facility]  09/02/2023, 11:53 PM

## 2023-09-02 NOTE — ED Notes (Signed)
ED TO INPATIENT HANDOFF REPORT  ED Nurse Name and Phone #: Robbi Garter Name/Age/Gender Troy Bruce [name] 82 y.o. male Room/Bed: WA04/WA04  Code Status   Code Status: Prior  Home/SNF/Other [facility] Patient oriented to: self, place, time, and situation Is this baseline? Yes   Triage Complete: Triage complete  Chief Complaint Acute respiratory failure with hypoxia (HCC) [J96.01]  Triage Note Pt BIBA from [facility] c/o new onset of vomiting, weakness, and exacerbation of his tremors.  Vomiting aprox. 6 times.  Pt also c/o urinary retention. Hx of lung cancer but hasn't started treatment yet. Hx of A fib, CHF, COPD and diabetes.  En route was 85 O2, 3L of [place] placed by EMS and went up to 94.  BP 122/60 HR 115 RR 18 O2 94 3L  CBG 249    Allergies Allergies  Allergen Reactions   Niacin-Lovastatin Er Shortness Of Breath and Other (See Comments)    Dyspnea Flushing  *Not listed on MAR    Penicillins Hives, Shortness Of Breath and Other (See Comments)    Dyspnea Flushing Because of a history of documented adverse serious drug reaction;Medi Alert bracelet  is recommended PCN reaction causing immediate rash, facial/tongue/throat swelling, SOB or lightheadedness with hypotension: Yes PCN reaction causing severe rash involving mucus membranes or skin necrosis: No PCN reaction occurring within the last 10 years: NO PCN reaction that required hospitalization: NO   Sulfonamide Derivatives Hives, Shortness Of Breath and Other (See Comments)    Dyspnea  Flushing Because of a history of documented adverse serious drug reaction;Medi Alert bracelet  is recommended   Lipitor [Atorvastatin] Other (See Comments)    Myalgias Arthralgias   *Not listed on MAR     Level of Care/Admitting Diagnosis ED Disposition     ED Disposition  Admit   Condition  --   Comment  Hospital Area: [facility] [[id number]]  Level of Care: Telemetry [5]  Admit to tele  based on following criteria: Monitor QTC interval  May admit patient to [facility] or [facility] if equivalent level of care is available:: Yes  Covid Evaluation: Asymptomatic - no recent exposure (last 10 days) testing not required  Diagnosis: Acute respiratory failure with hypoxia [facility]) [[id number]]  Admitting Physician: Briscoe Deutscher [[id number]]  Attending Physician: Briscoe Deutscher [[id number]]  Certification:: I certify this patient will need inpatient services for at least 2 midnights  Expected Medical Readiness: 09/05/2023          B Medical/Surgery History Past Medical History:  Diagnosis Date   AAA (abdominal aortic aneurysm) (HCC) 10-20-17   MONITORED BY DR Edilia Bo, 3.7 CM by ABDOMINAL US 11/2021   Anxiety    Arthritis    Atrial fibrillation (HCC)    Basal cell carcinoma    BPH (benign prostatic hypertrophy)    Chronic diastolic heart failure (HCC)    Chronic kidney disease    stage 3, pt unaware   Colon cancer (HCC)    COPD (chronic obstructive pulmonary disease) (HCC)    Depression    GERD (gastroesophageal reflux disease)    Glaucoma BOTH EYES   Dr Nile Riggs   History of lung cancer APRIL 2012   ONCOLOGIST- DR Mariel Sleet  LOV IN [facility] 10-27-2012 SQUAMOUS CELL---- S/P RIGHT LOWER LOBECTOMY AT [facility] -- NO CHEMORADIATION--- NO RECURRENCE   Hx of adenomatous colonic polyps 2005    X 2; 1 hyperplastic polyp; Dr Juanda Chance   Hyperlipidemia    Hypertension  OSA (obstructive sleep apnea) 08/29/2015   CPAP SET ON 10   Peripheral vascular disease (HCC)    FOLLOWED  BY DR DICKSON   Spinal stenosis 06/2014   Thoracic aorta atherosclerosis (HCC)    Thyrotoxicosis    amiodarone induced   Past Surgical History:  Procedure Laterality Date   ABDOMINAL AORTOGRAM W/LOWER EXTREMITY N/A 12/20/2022   Procedure: ABDOMINAL AORTOGRAM W/LOWER EXTREMITY;  Surgeon: Cephus Shelling, MD;  [facility];  Service: Cardiovascular;  Laterality: N/A;   ANGIO PLASTY     X 4 in  legs   AORTOGRAM  07-27-2002   MILD DIFFUSE ILIAC ARTERY OCCLUSIVE DISEASE /  LEFT RENAL ARTERY 20%/ PATENT LEFT FEM-POP GRAFT/ MILD SFA AND POPLITEAL ARTERY OCCLUSIVE DISEASE W/ SEVERE KIDNEY OCCLUSIVE DISEASE   ATRIAL FIBRILLATION ABLATION N/A 04/09/2019   Procedure: ATRIAL FIBRILLATION ABLATION;  Surgeon: Hillis Range, MD;  [facility];  Service: Cardiovascular;  Laterality: N/A;   ATRIAL FIBRILLATION ABLATION N/A 02/12/2020   Procedure: ATRIAL FIBRILLATION ABLATION;  Surgeon: Hillis Range, MD;  [facility];  Service: Cardiovascular;  Laterality: N/A;   BALLOON DILATION N/A 02/06/2022   Procedure: BALLOON Tamponade ampulla;  Surgeon: Lemar Lofty., MD;  Location: [facility] ENDOSCOPY;  Service: Gastroenterology;  Laterality: N/A;   BASAL CELL CARCINOMA EXCISION     MULTIPLE TIMES--  RIGHT FOREARM, CHEEKS, AND BACK   BIOPSY  06/25/2018   Procedure: BIOPSY;  Surgeon: Meridee Score Netty Starring., MD;  Location: [facility] ENDOSCOPY;  Service: Gastroenterology;;   BIOPSY  02/06/2022   Procedure: BIOPSY;  Surgeon: Lemar Lofty., MD;  Location: [facility] ENDOSCOPY;  Service: Gastroenterology;;   BRONCHIAL BIOPSY  03/26/2023   Procedure: BRONCHIAL BIOPSIES;  Surgeon: [name], MD;  Location: [facility] ENDOSCOPY;  Service: Pulmonary;;   BRONCHIAL BRUSHINGS  03/26/2023   Procedure: BRONCHIAL BRUSHINGS;  Surgeon: [name], MD;  Location: [facility] ENDOSCOPY;  Service: Pulmonary;;   BRONCHIAL NEEDLE ASPIRATION BIOPSY  03/26/2023   Procedure: BRONCHIAL NEEDLE ASPIRATION BIOPSIES;  Surgeon: [name], MD;  Location: [facility] ENDOSCOPY;  Service: Pulmonary;;   BRONCHIAL WASHINGS  03/26/2023   Procedure: BRONCHIAL WASHINGS;  Surgeon: [name], MD;  [facility];  Service: Pulmonary;;   CARDIOVASCULAR STRESS TEST  12-08-2012  DR MCLEAN   NORMAL LEXISCAN WITH NO EXERCISE NUCLEAR STUDY/ EF 66%/   NO ISCHEMIA/ NO SIGNIFICANT CHANGE FROM PRIOR STUDY   CARDIOVERSION N/A  02/14/2018   Procedure: CARDIOVERSION;  Surgeon: Laurey Morale, MD;  Location: [facility] ENDOSCOPY;  Service: Cardiovascular;  Laterality: N/A;   CARDIOVERSION N/A 07/10/2022   Procedure: CARDIOVERSION;  Surgeon: Laurey Morale, MD;  Location: [facility] ENDOSCOPY;  Service: Cardiovascular;  Laterality: N/A;   CAROTID ANGIOGRAM N/A 07/10/2013   Procedure: CAROTID ANGIOGRAM;  Surgeon: Sherren Kerns, MD;  Location: [facility] CATH LAB;  Service: Cardiovascular;  Laterality: N/A;   CAROTID ENDARTERECTOMY Right 07-14-13   cea   CATARACT EXTRACTION W/ INTRAOCULAR LENS  IMPLANT, BILATERAL     colon polyectomy     COLONOSCOPY     COLONOSCOPY WITH PROPOFOL N/A 06/25/2018   Procedure: COLONOSCOPY WITH PROPOFOL;  Surgeon: Meridee Score Netty Starring., MD;  Location: [facility] ENDOSCOPY;  Service: Gastroenterology;  Laterality: N/A;   CYSTOSCOPY W/ RETROGRADES Bilateral 01/21/2013   Procedure: CYSTOSCOPY WITH RETROGRADE PYELOGRAM;  Surgeon: Milford Cage, MD;  Location: [facility];  Service: Urology;  Laterality: Bilateral;   CYSTO, BLADDER BIOPSY, BILATERAL RETROGRADE PYELOGRAM  RAD TECH FROM  RADIOLOGY PER JOY   CYSTOSCOPY WITH BIOPSY N/A 01/21/2013   Procedure: CYSTOSCOPY WITH BIOPSY;  Surgeon: Milford Cage, MD;  Location: [facility];  Service: Urology;  Laterality: N/A;   ENDARTERECTOMY Right 07/14/2013   Procedure: ENDARTERECTOMY CAROTID;  Surgeon: Chuck Hint, MD;  Location: [facility] OR;  Service: Vascular;  Laterality: Right;   ENDOSCOPIC RETROGRADE CHOLANGIOPANCREATOGRAPHY (ERCP) WITH PROPOFOL N/A 02/06/2022   Procedure: ENDOSCOPIC RETROGRADE CHOLANGIOPANCREATOGRAPHY (ERCP) WITH PROPOFOL;  Surgeon: Lemar Lofty., MD;  Location: [facility] ENDOSCOPY;  Service: Gastroenterology;  Laterality: N/A;   EP IMPLANTABLE DEVICE N/A 08/12/2015   Procedure: Loop Recorder Insertion;  Surgeon: Hillis Range, MD;  [facility];  Service: Cardiovascular;  Laterality: N/A;    ESOPHAGOGASTRODUODENOSCOPY Left 02/13/2022   Procedure: ESOPHAGOGASTRODUODENOSCOPY (EGD);  Surgeon: Shellia Cleverly, DO;  Location: [facility] ENDOSCOPY;  Service: Gastroenterology;  Laterality: Left;   ESOPHAGOGASTRODUODENOSCOPY (EGD) WITH PROPOFOL N/A 06/25/2018   Procedure: ESOPHAGOGASTRODUODENOSCOPY (EGD) WITH PROPOFOL;  Surgeon: Meridee Score Netty Starring., MD;  Location: [facility] ENDOSCOPY;  Service: Gastroenterology;  Laterality: N/A;   ESOPHAGOGASTRODUODENOSCOPY (EGD) WITH PROPOFOL N/A 07/10/2018   Procedure: ESOPHAGOGASTRODUODENOSCOPY (EGD) WITH PROPOFOL;  Surgeon: Rachael Fee, MD;  Location: [facility] ENDOSCOPY;  Service: Endoscopy;  Laterality: N/A;   EUS N/A 07/10/2018   Procedure: UPPER ENDOSCOPIC ULTRASOUND (EUS) RADIAL;  Surgeon: Rachael Fee, MD;  Location: [facility] ENDOSCOPY;  Service: Endoscopy;  Laterality: N/A;   EYE SURGERY Right    FEMORAL-POPLITEAL BYPASS GRAFT Left 1994  [facility]   AND 2001  IN [place]   FEMORAL-POPLITEAL BYPASS GRAFT Left 08/30/2015   Procedure: REVISION OF BYPASS GRAFT Left  FEMORAL-POPLITEAL ARTERY;  Surgeon: Chuck Hint, MD;  Location: [facility] OR;  Service: Vascular;  Laterality: Left;   FINE NEEDLE ASPIRATION N/A 07/10/2018   Procedure: FINE NEEDLE ASPIRATION (FNA) LINEAR;  Surgeon: Rachael Fee, MD;  Location: [facility] ENDOSCOPY;  Service: Endoscopy;  Laterality: N/A;   FLEXIBLE SIGMOIDOSCOPY N/A 12/12/2018   Procedure: FLEXIBLE SIGMOIDOSCOPY;  Surgeon: Andria Meuse, MD;  Location: [facility] ORS;  Service: General;  Laterality: N/A;   HEMOSTASIS CLIP PLACEMENT  02/13/2022   Procedure: HEMOSTASIS CLIP PLACEMENT;  Surgeon: Shellia Cleverly, DO;  [facility] ENDOSCOPY;  Service: Gastroenterology;;   HEMOSTASIS CONTROL  03/26/2023   Procedure: HEMOSTASIS CONTROL;  Surgeon: [name], MD;  Location: [facility] ENDOSCOPY;  Service: Pulmonary;;   HOT HEMOSTASIS N/A 02/13/2022   Procedure: HOT HEMOSTASIS (ARGON PLASMA COAGULATION/BICAP);  Surgeon: Shellia Cleverly, DO;   Location: [facility] ENDOSCOPY;  Service: Gastroenterology;  Laterality: N/A;   LARYNGOSCOPY  06-27-2004   BX VOCAL CORD  (LEUKOPLAKIA)  PER PT NO ISSUES SINCE   LEFT HEART CATH AND CORONARY ANGIOGRAPHY N/A 08/20/2017   Procedure: LEFT HEART CATH AND CORONARY ANGIOGRAPHY;  Surgeon: Laurey Morale, MD;  Location: [facility] INVASIVE CV LAB;  Service: Cardiovascular;  Laterality: N/A;   LOOP RECORDER INSERTION N/A 04/09/2019   Procedure: LOOP RECORDER INSERTION;  Surgeon: Hillis Range, MD;  [facility];  Service: Cardiovascular;  Laterality: N/A;   LOOP RECORDER REMOVAL N/A 04/09/2019   Procedure: LOOP RECORDER REMOVAL;  Surgeon: Hillis Range, MD;  [facility];  Service: Cardiovascular;  Laterality: N/A;   LOWER EXTREMITY ANGIOGRAM Bilateral 08/29/2015   Procedure: Lower Extremity Angiogram;  Surgeon: Chuck Hint, MD;  Location: [facility] INVASIVE CV LAB;  Service: Cardiovascular;  Laterality: Bilateral;   LUNG LOBECTOMY  01/24/2011    RIGHT UPPER LOBE  (SQUAMOUS CELL  CARCINOMA) Dr Desmond Lope , [facility]. No chemotherapyor radiation   PANCREATIC STENT PLACEMENT  02/06/2022   Procedure: PANCREATIC STENT PLACEMENT;  Surgeon: Meridee Score Netty Starring., MD;  Location: [facility] ENDOSCOPY;  Service: Gastroenterology;;   [facility] ANGIOPLASTY Right 07/14/2013   Procedure: PATCH ANGIOPLASTY;  Surgeon: Chuck Hint, MD;  Location: [facility] OR;  Service: Vascular;  Laterality: Right;   PATCH ANGIOPLASTY Left 08/30/2015   Procedure: VEIN PATCH ANGIOPLASTY OF PROXIMAL Left BYPASS GRAFT;  Surgeon: Chuck Hint, MD;  Location: [facility] OR;  Service: Vascular;  Laterality: Left;   PERIPHERAL VASCULAR CATHETERIZATION N/A 08/29/2015   Procedure: Abdominal Aortogram;  Surgeon: Chuck Hint, MD;  Location: [facility] INVASIVE CV LAB;  Service: Cardiovascular;  Laterality: N/A;   POLYPECTOMY  06/25/2018   Procedure: POLYPECTOMY;  Surgeon: Meridee Score Netty Starring., MD;  Location: [facility] ENDOSCOPY;  Service:  Gastroenterology;;   POLYPECTOMY     POLYPECTOMY  02/13/2022   Procedure: POLYPECTOMY;  Surgeon: Shellia Cleverly, DO;  Location[facility] ENDOSCOPY;  Service: Gastroenterology;;   REMOVAL OF STONES  02/06/2022   Procedure: REMOVAL OF STONES;  Surgeon: Lemar Lofty., MD;  Location: [facility] ENDOSCOPY;  Service: Gastroenterology;;   Susa Day  02/13/2022   Procedure: Susa Day;  Surgeon: Shellia Cleverly, DO;  Location: [facility] ENDOSCOPY;  Service: Gastroenterology;;   Dennison Mascot  02/06/2022   Procedure: Dennison Mascot;  Surgeon: Lemar Lofty., MD;  Location: [facility] ENDOSCOPY;  Service: Gastroenterology;;   SUBMUCOSAL INJECTION  06/25/2018   Procedure: SUBMUCOSAL INJECTION;  Surgeon: Lemar Lofty., MD;  Location: [facility] ENDOSCOPY;  Service: Gastroenterology;;   TEE WITHOUT CARDIOVERSION N/A 02/14/2018   Procedure: TRANSESOPHAGEAL ECHOCARDIOGRAM (TEE);  Surgeon: Laurey Morale, MD;  Location: [facility] ENDOSCOPY;  Service: Cardiovascular;  Laterality: N/A;   trabecular surgery     OS   TRANSTHORACIC ECHOCARDIOGRAM  12-29-2012  DR [facility]   MILD LVH/  LVSF NORMAL/ EF 55-60%/  GRADE I DIASTOLIC DYSFUNCTION   VIDEO BRONCHOSCOPY WITH RADIAL ENDOBRONCHIAL ULTRASOUND  03/26/2023   Procedure: VIDEO BRONCHOSCOPY WITH RADIAL ENDOBRONCHIAL ULTRASOUND;  Surgeon: [name], MD;  Location: [facility] ENDOSCOPY;  Service: Pulmonary;;     A IV Location/Drains/Wounds Patient Lines/Drains/Airways Status     Active Line/Drains/Airways     Name Placement date Placement time Site Days   Peripheral IV 09/02/23 20 G 1" Distal;Left;Posterior Forearm 09/02/23  1728  Forearm  less than 1   GI Stent 02/06/22  1119  --  573   Incision - 5 Ports Abdomen Mid;Upper Right;Upper;Medial Medial;Mid;Right Right;Lateral Right;Lower 12/12/18  0808  -- 1725   Wound / Incision (Open or Dehisced) 02/12/22 Laceration Face Left;Upper 02/12/22  1942  Face  567            Intake/Output Last 24 hours No intake or  output data in the 24 hours ending 09/02/23 2307  Labs/Imaging Results for orders placed or performed during the hospital encounter of 09/02/23 (from the past 48 hour(s))  Comprehensive metabolic panel     Status: Abnormal   Collection Time: 09/02/23  5:30 PM  Result Value Ref Range   Sodium 135 135 - 145 mmol/L   Potassium 3.3 (L) 3.5 - 5.1 mmol/L   Chloride 100 98 - 111 mmol/L   CO2 22 22 - 32 mmol/L   Glucose, Bld 145 (H) 70 - 99 mg/dL    Comment: Glucose reference range applies only to samples taken after fasting for at least 8 hours.   BUN 22 8 - 23 mg/dL   Creatinine, Ser 1.61 (  H) 0.61 - 1.24 mg/dL   Calcium 9.3 8.9 - 21.3 mg/dL   Total Protein 8.1 6.5 - 8.1 g/dL   Albumin 3.8 3.5 - 5.0 g/dL   AST 16 15 - 41 U/L   ALT 13 0 - 44 U/L   Alkaline Phosphatase 89 38 - 126 U/L   Total Bilirubin 1.0 <1.2 mg/dL   GFR, Estimated 44 (L) >60 mL/min    Comment: (NOTE) Calculated using the CKD-EPI Creatinine Equation (2021)    Anion gap 13 5 - 15    Comment: Performed at [facility], [address] [place], [place] [address]  CBC with Differential     Status: Abnormal   Collection Time: 09/02/23  5:30 PM  Result Value Ref Range   WBC 10.5 4.0 - 10.5 K/uL   RBC 5.00 4.22 - 5.81 MIL/uL   Hemoglobin 15.1 13.0 - 17.0 g/dL   HCT 84.6 96.2 - 95.2 %   MCV 88.8 80.0 - 100.0 fL   MCH 30.2 26.0 - 34.0 pg   MCHC 34.0 30.0 - 36.0 g/dL   RDW 84.1 32.4 - 40.1 %   Platelets 343 150 - 400 K/uL   nRBC 0.0 0.0 - 0.2 %   Neutrophils Relative % 94 %   Neutro Abs 9.9 (H) 1.7 - 7.7 K/uL   Lymphocytes Relative 3 %   Lymphs Abs 0.3 (L) 0.7 - 4.0 K/uL   Monocytes Relative 3 %   Monocytes Absolute 0.3 0.1 - 1.0 K/uL   Eosinophils Relative 0 %   Eosinophils Absolute 0.0 0.0 - 0.5 K/uL   Basophils Relative 0 %   Basophils Absolute 0.0 0.0 - 0.1 K/uL   Immature Granulocytes 0 %   Abs Immature Granulocytes 0.04 0.00 - 0.07 K/uL    Comment: Performed at [facility], [address], [place], [place] [address]  Protime-INR     Status: Abnormal   Collection Time: 09/02/23  5:30 PM  Result Value Ref Range   Prothrombin Time 15.9 (H) 11.4 - 15.2 seconds   INR 1.2 0.8 - 1.2    Comment: (NOTE) INR goal varies based on device and disease states. Performed at [facility], [address] [place], [place] [address]   Troponin I (High Sensitivity)     Status: Abnormal   Collection Time: 09/02/23  5:30 PM  Result Value Ref Range   Troponin I (High Sensitivity) 18 (H) <18 ng/L    Comment: (NOTE) Elevated high sensitivity troponin I (hsTnI) values and significant  changes across serial measurements may suggest ACS but many other  chronic and acute conditions are known to elevate hsTnI results.  Refer to the "Links" section for chest pain algorithms and additional  guidance. Performed at [facility], [address], [place], [place] [address]   Brain natriuretic peptide     Status: Abnormal   Collection Time: 09/02/23  5:30 PM  Result Value Ref Range   B Natriuretic Peptide 212.0 (H) 0.0 - 100.0 pg/mL    Comment: Performed at [facility], [address] [place], [place] [address]  Magnesium     Status: Abnormal   Collection Time: 09/02/23  5:30 PM  Result Value Ref Range   Magnesium 2.7 (H) 1.7 - 2.4 mg/dL    Comment: Performed at [facility], [address], [place], [place] [address]  I-Stat Lactic Acid, ED     Status: None   Collection Time:  09/02/23  5:33 PM  Result Value Ref Range   Lactic Acid, Venous 1.9 0.5 - 1.9 mmol/L  Troponin I (High Sensitivity)     Status: Abnormal   Collection Time: 09/02/23  8:25 PM  Result Value Ref Range   Troponin I (High Sensitivity) 22 (H) <18 ng/L    Comment: (NOTE) Elevated high sensitivity troponin I (hsTnI) values and significant  changes across serial measurements may suggest ACS but many other  chronic and acute  conditions are known to elevate hsTnI results.  Refer to the "Links" section for chest pain algorithms and additional  guidance. Performed at [facility], [address] [place], [place] [address]   I-Stat Lactic Acid, ED     Status: None   Collection Time: 09/02/23  8:31 PM  Result Value Ref Range   Lactic Acid, Venous 1.5 0.5 - 1.9 mmol/L   *Note: Due to a large number of results and/or encounters for the requested time period, some results have not been displayed. A complete set of results can be found in Results Review.   DG Chest 2 View  Result Date: 09/02/2023 CLINICAL DATA:  Suspected sepsis. New onset vomiting, weakness, and tremors. History of lung cancer not on treatment. EXAM: CHEST - 2 VIEW COMPARISON:  08/26/2023 FINDINGS: Shallow inspiration. Heart size and pulmonary vascularity are likely normal for technique. There is infiltration or atelectasis in the right mid lung with small right pleural effusion. This appears similar to the prior study and could indicate atelectasis or pneumonia. Mild perihilar infiltration bilaterally may represent edema. No pneumothorax. Mediastinal contours appear intact. IMPRESSION: 1. Shallow inspiration. 2. Persistent small right pleural effusion with infiltration in the right lower lung, possibly infection or atelectasis. 3. Perihilar infiltration may indicate superimposed edema Electronically Signed   By: Burman Nieves M.D.   On: 09/02/2023 21:36    Pending Labs Unresulted Labs (From admission, onward)     Start     Ordered   09/02/23 1806  Resp panel by RT-PCR (RSV, Flu A&B, Covid) Anterior Nasal Swab  Once,   URGENT        09/02/23 1806   09/02/23 1714  Culture, blood (Routine x 2)  BLOOD CULTURE X 2,   R (with STAT occurrences)      09/02/23 1714   09/02/23 1714  Urinalysis, w/ Reflex to Culture (Infection Suspected) -Urine, Clean Catch  Once,   URGENT       Question:  Specimen Source  Answer:  Urine, Clean Catch    09/02/23 1714            Vitals/Pain Today's Vitals   09/02/23 2145 09/02/23 2200 09/02/23 2203 09/02/23 2204  BP:  (!) 140/72    Pulse: 77 77    Resp: 18 (!) 24    Temp:    97.6 F (36.4 C)  TempSrc:    Oral  SpO2: 91% (!) 86%    Weight:      Height:      PainSc:   0-No pain     Isolation Precautions No active isolations  Medications Medications  levofloxacin (LEVAQUIN) IVPB 500 mg (500 mg Intravenous New Bag/Given 09/02/23 2248)  morphine (PF) 4 MG/ML injection 4 mg (4 mg Intravenous Given 09/02/23 1832)  ondansetron (ZOFRAN) injection 4 mg (4 mg Intravenous Given 09/02/23 1831)  furosemide (LASIX) injection 40 mg (40 mg Intravenous Given 09/02/23 2248)    Mobility non-ambulatory     Focused Assessments     R  Recommendations: See Admitting Provider Note  Report given to:   Additional Notes:  '

## 2023-09-02 NOTE — ED Triage Notes (Addendum)
Pt BIBA from [facility] c/o new onset of vomiting, weakness, and exacerbation of his tremors.  Vomiting aprox. 6 times.  Pt also c/o urinary retention. Hx of lung cancer but hasn't started treatment yet. Hx of A fib, CHF, COPD and diabetes.  En route was 85 O2, 3L of [place] placed by EMS and went up to 94.  BP 122/60 HR 115 RR 18 O2 94 3L  CBG 249

## 2023-09-03 ENCOUNTER — Telehealth: Payer: Self-pay | Admitting: Radiation Oncology

## 2023-09-03 DIAGNOSIS — I5032 Chronic diastolic (congestive) heart failure: Secondary | ICD-10-CM

## 2023-09-03 DIAGNOSIS — J189 Pneumonia, unspecified organism: Secondary | ICD-10-CM | POA: Diagnosis not present

## 2023-09-03 DIAGNOSIS — I10 Essential (primary) hypertension: Secondary | ICD-10-CM

## 2023-09-03 DIAGNOSIS — J9601 Acute respiratory failure with hypoxia: Secondary | ICD-10-CM

## 2023-09-03 DIAGNOSIS — C189 Malignant neoplasm of colon, unspecified: Secondary | ICD-10-CM

## 2023-09-03 LAB — RESP PANEL BY RT-PCR (RSV, FLU A&B, COVID)  RVPGX2
Influenza A by PCR: NEGATIVE
Influenza B by PCR: NEGATIVE
Resp Syncytial Virus by PCR: NEGATIVE
SARS Coronavirus 2 by RT PCR: NEGATIVE

## 2023-09-03 LAB — GLUCOSE, CAPILLARY
Glucose-Capillary: 161 mg/dL — ABNORMAL HIGH (ref 70–99)
Glucose-Capillary: 158 mg/dL — ABNORMAL HIGH (ref 70–99)
Glucose-Capillary: 190 mg/dL — ABNORMAL HIGH (ref 70–99)
Glucose-Capillary: 174 mg/dL — ABNORMAL HIGH (ref 70–99)
Glucose-Capillary: 114 mg/dL — ABNORMAL HIGH (ref 70–99)

## 2023-09-03 LAB — CBC
HCT: 43.6 % (ref 39.0–52.0)
Hemoglobin: 13.9 g/dL (ref 13.0–17.0)
MCH: 29 pg (ref 26.0–34.0)
MCHC: 31.9 g/dL (ref 30.0–36.0)
MCV: 91 fL (ref 80.0–100.0)
Platelets: 277 10*3/uL (ref 150–400)
RBC: 4.79 MIL/uL (ref 4.22–5.81)
RDW: 15.6 % — ABNORMAL HIGH (ref 11.5–15.5)
WBC: 7.8 10*3/uL (ref 4.0–10.5)
nRBC: 0 % (ref 0.0–0.2)

## 2023-09-03 LAB — BASIC METABOLIC PANEL
Anion gap: 8 (ref 5–15)
BUN: 24 mg/dL — ABNORMAL HIGH (ref 8–23)
CO2: 23 mmol/L (ref 22–32)
Calcium: 8.7 mg/dL — ABNORMAL LOW (ref 8.9–10.3)
Chloride: 103 mmol/L (ref 98–111)
Creatinine, Ser: 1.59 mg/dL — ABNORMAL HIGH (ref 0.61–1.24)
GFR, Estimated: 43 mL/min — ABNORMAL LOW (ref >60)
Glucose, Bld: 172 mg/dL — ABNORMAL HIGH (ref 70–99)
Potassium: 3.9 mmol/L (ref 3.5–5.1)
Sodium: 134 mmol/L — ABNORMAL LOW (ref 135–145)

## 2023-09-03 LAB — URINALYSIS, W/ REFLEX TO CULTURE (INFECTION SUSPECTED)
Bilirubin Urine: NEGATIVE
Glucose, UA: >=500 mg/dL — AB
Ketones, ur: NEGATIVE mg/dL
Leukocytes,Ua: NEGATIVE
Nitrite: NEGATIVE
Protein, ur: 30 mg/dL — AB
Specific Gravity, Urine: 1.015 (ref 1.005–1.030)
pH: 5 (ref 5.0–8.0)

## 2023-09-03 LAB — HEMOGLOBIN A1C
Hgb A1c MFr Bld: 6.6 % — ABNORMAL HIGH (ref 4.8–5.6)
Mean Plasma Glucose: 142.72 mg/dL

## 2023-09-03 LAB — STREP PNEUMONIAE URINARY ANTIGEN: Strep Pneumo Urinary Antigen: NEGATIVE

## 2023-09-03 MED ORDER — ALBUTEROL SULFATE (2.5 MG/3ML) 0.083% IN NEBU: 2.5000 mg | INHALATION_SOLUTION | Freq: Four times a day (QID) | RESPIRATORY_TRACT | Status: DC | PRN

## 2023-09-03 MED ORDER — SODIUM CHLORIDE 0.9 % IV SOLN
500.0000 mg | INTRAVENOUS | Status: DC
  Administered 2023-09-03 – 2023-09-04 (×2): 500 mg via INTRAVENOUS
  Filled 2023-09-03 (×2): qty 5

## 2023-09-03 MED ORDER — SODIUM CHLORIDE 0.9 % IV SOLN
2.0000 g | INTRAVENOUS | Status: DC
  Administered 2023-09-03 – 2023-09-04 (×2): 2 g via INTRAVENOUS
  Filled 2023-09-03 (×2): qty 20

## 2023-09-03 MED ORDER — MOMETASONE FURO-FORMOTEROL FUM 100-5 MCG/ACT IN AERO
2.0000 | INHALATION_SPRAY | Freq: Two times a day (BID) | RESPIRATORY_TRACT | Status: DC
  Administered 2023-09-03 – 2023-09-07 (×9): 2 via RESPIRATORY_TRACT
  Filled 2023-09-03: qty 8.8

## 2023-09-03 MED ORDER — UMECLIDINIUM BROMIDE 62.5 MCG/ACT IN AEPB
1.0000 | INHALATION_SPRAY | Freq: Every day | RESPIRATORY_TRACT | Status: DC
  Administered 2023-09-03 – 2023-09-07 (×5): 1 via RESPIRATORY_TRACT
  Filled 2023-09-03: qty 7

## 2023-09-03 NOTE — H&P (Incomplete)
History and Physical    Cordairo Mumby GNF:[id number] DOB: [birth year] DOA: 09/02/2023  PCP: Pincus Sanes, MD   Patient coming from: [place]   Chief Complaint: N/V, SOB, productive cough   HPI: Troy [name]. is a pleasant 82 y.o. 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  Past Medical History:  Diagnosis Date  . AAA (abdominal aortic aneurysm) (HCC) 10-20-17   MONITORED BY DR Edilia Bo, 3.7 CM by ABDOMINAL US 11/2021  . Anxiety   . Arthritis   . Atrial fibrillation (HCC)   . Basal cell carcinoma   . BPH (benign prostatic hypertrophy)   . Chronic diastolic heart failure (HCC)   . Chronic kidney disease    stage 3, pt unaware  . Colon cancer (HCC)   . COPD (chronic obstructive pulmonary disease) (HCC)   . Depression   . GERD (gastroesophageal reflux disease)   . Glaucoma BOTH EYES   Dr Nile Riggs  . History of lung cancer APRIL 2012   ONCOLOGIST- DR Mariel Sleet  LOV IN [facility] 10-27-2012 SQUAMOUS CELL---- S/P RIGHT LOWER LOBECTOMY AT [facility] -- NO CHEMORADIATION--- NO RECURRENCE  . Hx of adenomatous colonic polyps 2005    X 2; 1 hyperplastic polyp; Dr Juanda Chance  . Hyperlipidemia   . Hypertension   . Nausea & vomiting 09/02/2023  . OSA (obstructive sleep apnea) 08/29/2015   CPAP SET ON 10  . Peripheral vascular disease (HCC)    FOLLOWED  BY DR Edilia Bo  . Spinal stenosis 06/2014  . Thoracic aorta atherosclerosis (HCC)   . Thyrotoxicosis    amiodarone induced    Past Surgical History:  Procedure Laterality Date  . ABDOMINAL AORTOGRAM W/LOWER EXTREMITY N/A  12/20/2022   Procedure: ABDOMINAL AORTOGRAM W/LOWER EXTREMITY;  Surgeon: Cephus Shelling, MD;  Location: [facility] INVASIVE CV LAB;  Service: Cardiovascular;  Laterality: N/A;  . ANGIO PLASTY     X 4 in legs  . AORTOGRAM  07-27-2002   MILD DIFFUSE ILIAC ARTERY OCCLUSIVE DISEASE /  LEFT RENAL ARTERY 20%/ PATENT LEFT FEM-POP GRAFT/ MILD SFA AND POPLITEAL ARTERY OCCLUSIVE DISEASE W/ SEVERE KIDNEY OCCLUSIVE DISEASE  . ATRIAL FIBRILLATION ABLATION N/A 04/09/2019   Procedure: ATRIAL FIBRILLATION ABLATION;  Surgeon: Hillis Range, MD;  Location: [facility] INVASIVE CV LAB;  Service: Cardiovascular;  Laterality: N/A;  . ATRIAL FIBRILLATION ABLATION N/A 02/12/2020   Procedure: ATRIAL FIBRILLATION ABLATION;  Surgeon: Hillis Range, MD;  Location: [facility] INVASIVE CV LAB;  Service: Cardiovascular;  Laterality: N/A;  . BALLOON DILATION N/A 02/06/2022   Procedure: BALLOON Tamponade ampulla;  Surgeon: Lemar Lofty., MD;  Location: [facility] ENDOSCOPY;  Service: Gastroenterology;  Laterality: N/A;  . BASAL CELL CARCINOMA EXCISION     MULTIPLE TIMES--  RIGHT FOREARM, CHEEKS, AND BACK  . BIOPSY  06/25/2018   Procedure: BIOPSY;  Surgeon: Meridee Score Netty Starring., MD;  Location: [facility] ENDOSCOPY;  Service: Gastroenterology;;  . BIOPSY  02/06/2022   Procedure: BIOPSY;  Surgeon: Lemar Lofty., MD;  Location: [facility] ENDOSCOPY;  Service: Gastroenterology;;  . BRONCHIAL BIOPSY  03/26/2023   Procedure: BRONCHIAL BIOPSIES;  Surgeon: [name], MD;  Location: [facility] ENDOSCOPY;  Service: Pulmonary;;  . BRONCHIAL BRUSHINGS  03/26/2023   Procedure: BRONCHIAL BRUSHINGS;  Surgeon: [name], MD;  Location: [facility] ENDOSCOPY;  Service: Pulmonary;;  . BRONCHIAL NEEDLE ASPIRATION BIOPSY  03/26/2023   Procedure: BRONCHIAL NEEDLE ASPIRATION BIOPSIES;  Surgeon: [name], MD;  Location: [facility] ENDOSCOPY;  Service: Pulmonary;;  . BRONCHIAL WASHINGS  03/26/2023   Procedure: BRONCHIAL WASHINGS;  Surgeon: [name], MD;  Location: [facility] ENDOSCOPY;   Service: Pulmonary;;  . CARDIOVASCULAR STRESS TEST  12-08-2012  DR MCLEAN   NORMAL LEXISCAN WITH NO EXERCISE NUCLEAR STUDY/ EF 66%/   NO ISCHEMIA/ NO SIGNIFICANT CHANGE FROM PRIOR STUDY  . CARDIOVERSION N/A 02/14/2018   Procedure: CARDIOVERSION;  Surgeon: Laurey Morale, MD;  Location: [facility] ENDOSCOPY;  Service: Cardiovascular;  Laterality: N/A;  . CARDIOVERSION N/A 07/10/2022   Procedure: CARDIOVERSION;  Surgeon: Laurey Morale, MD;  Location: [facility] ENDOSCOPY;  Service: Cardiovascular;  Laterality: N/A;  . CAROTID ANGIOGRAM N/A 07/10/2013   Procedure: CAROTID ANGIOGRAM;  Surgeon: Sherren Kerns, MD;  Location: [facility] CATH LAB;  Service: Cardiovascular;  Laterality: N/A;  . CAROTID ENDARTERECTOMY Right 07-14-13   cea  . CATARACT EXTRACTION W/ INTRAOCULAR LENS  IMPLANT, BILATERAL    . colon polyectomy    . COLONOSCOPY    . COLONOSCOPY WITH PROPOFOL N/A 06/25/2018   Procedure: COLONOSCOPY WITH PROPOFOL;  Surgeon: Meridee Score Netty Starring., MD;  Location: [facility] ENDOSCOPY;  Service: Gastroenterology;  Laterality: N/A;  . CYSTOSCOPY W/ RETROGRADES Bilateral 01/21/2013   Procedure: CYSTOSCOPY WITH RETROGRADE PYELOGRAM;  Surgeon: Milford Cage, MD;  Location: [facility];  Service: Urology;  Laterality: Bilateral;   CYSTO, BLADDER BIOPSY, BILATERAL RETROGRADE PYELOGRAM  RAD TECH FROM RADIOLOGY PER JOY  . CYSTOSCOPY WITH BIOPSY N/A 01/21/2013   Procedure: CYSTOSCOPY WITH BIOPSY;  Surgeon: Milford Cage, MD;  Location: [facility];  Service: Urology;  Laterality: N/A;  . ENDARTERECTOMY Right 07/14/2013   Procedure: ENDARTERECTOMY CAROTID;  Surgeon: Chuck Hint, MD;  Location: [facility] OR;  Service: Vascular;  Laterality: Right;  . ENDOSCOPIC RETROGRADE CHOLANGIOPANCREATOGRAPHY (ERCP) WITH PROPOFOL N/A 02/06/2022   Procedure: ENDOSCOPIC RETROGRADE CHOLANGIOPANCREATOGRAPHY (ERCP) WITH PROPOFOL;  Surgeon: Lemar Lofty., MD;  Location: [facility] ENDOSCOPY;  Service:  Gastroenterology;  Laterality: N/A;  . EP IMPLANTABLE DEVICE N/A 08/12/2015   Procedure: Loop Recorder Insertion;  Surgeon: Hillis Range, MD;  Location: [facility] INVASIVE CV LAB;  Service: Cardiovascular;  Laterality: N/A;  . ESOPHAGOGASTRODUODENOSCOPY Left 02/13/2022   Procedure: ESOPHAGOGASTRODUODENOSCOPY (EGD);  Surgeon: Shellia Cleverly, DO;  Location: [facility] ENDOSCOPY;  Service: Gastroenterology;  Laterality: Left;  . ESOPHAGOGASTRODUODENOSCOPY (EGD) WITH PROPOFOL N/A 06/25/2018   Procedure: ESOPHAGOGASTRODUODENOSCOPY (EGD) WITH PROPOFOL;  Surgeon: Meridee Score Netty Starring., MD;  Location: [facility] ENDOSCOPY;  Service: Gastroenterology;  Laterality: N/A;  . ESOPHAGOGASTRODUODENOSCOPY (EGD) WITH PROPOFOL N/A 07/10/2018   Procedure: ESOPHAGOGASTRODUODENOSCOPY (EGD) WITH PROPOFOL;  Surgeon: Rachael Fee, MD;  Location: [facility] ENDOSCOPY;  Service: Endoscopy;  Laterality: N/A;  . EUS N/A 07/10/2018   Procedure: UPPER ENDOSCOPIC ULTRASOUND (EUS) RADIAL;  Surgeon: Rachael Fee, MD;  Location: [facility] ENDOSCOPY;  Service: Endoscopy;  Laterality: N/A;  . EYE SURGERY Right   . FEMORAL-POPLITEAL BYPASS GRAFT Left 1994  [facility]   AND 2001  IN [place]  . FEMORAL-POPLITEAL BYPASS GRAFT Left 08/30/2015   Procedure: REVISION OF BYPASS GRAFT Left  FEMORAL-POPLITEAL ARTERY;  Surgeon: Chuck Hint, MD;  Location: [facility] OR;  Service: Vascular;  Laterality: Left;  . FINE  NEEDLE ASPIRATION N/A 07/10/2018   Procedure: FINE NEEDLE ASPIRATION (FNA) LINEAR;  Surgeon: Rachael Fee, MD;  Location: [facility] ENDOSCOPY;  Service: Endoscopy;  Laterality: N/A;  . FLEXIBLE SIGMOIDOSCOPY N/A 12/12/2018   Procedure: FLEXIBLE SIGMOIDOSCOPY;  Surgeon: Andria Meuse, MD;  Location: [facility] ORS;  Service: General;  Laterality: N/A;  . HEMOSTASIS CLIP PLACEMENT  02/13/2022   Procedure: HEMOSTASIS CLIP PLACEMENT;  Surgeon: Shellia Cleverly, DO;  Location: [facility] ENDOSCOPY;  Service: Gastroenterology;;  . HEMOSTASIS CONTROL  03/26/2023    Procedure: HEMOSTASIS CONTROL;  Surgeon: [name], MD;  Location: [facility] ENDOSCOPY;  Service: Pulmonary;;  . HOT HEMOSTASIS N/A 02/13/2022   Procedure: HOT HEMOSTASIS (ARGON PLASMA COAGULATION/BICAP);  Surgeon: Shellia Cleverly, DO;  Location: [facility] ENDOSCOPY;  Service: Gastroenterology;  Laterality: N/A;  . LARYNGOSCOPY  06-27-2004   BX VOCAL CORD  (LEUKOPLAKIA)  PER PT NO ISSUES SINCE  . LEFT HEART CATH AND CORONARY ANGIOGRAPHY N/A 08/20/2017   Procedure: LEFT HEART CATH AND CORONARY ANGIOGRAPHY;  Surgeon: Laurey Morale, MD;  Location: [facility] INVASIVE CV LAB;  Service: Cardiovascular;  Laterality: N/A;  . LOOP RECORDER INSERTION N/A 04/09/2019   Procedure: LOOP RECORDER INSERTION;  Surgeon: Hillis Range, MD;  Location: [facility] INVASIVE CV LAB;  Service: Cardiovascular;  Laterality: N/A;  . LOOP RECORDER REMOVAL N/A 04/09/2019   Procedure: LOOP RECORDER REMOVAL;  Surgeon: Hillis Range, MD;  Location: [facility] INVASIVE CV LAB;  Service: Cardiovascular;  Laterality: N/A;  . LOWER EXTREMITY ANGIOGRAM Bilateral 08/29/2015   Procedure: Lower Extremity Angiogram;  Surgeon: Chuck Hint, MD;  Location: [facility] INVASIVE CV LAB;  Service: Cardiovascular;  Laterality: Bilateral;  . LUNG LOBECTOMY  01/24/2011    RIGHT UPPER LOBE  (SQUAMOUS CELL CARCINOMA) Dr Desmond Lope , [facility]. No chemotherapyor radiation  . PANCREATIC STENT PLACEMENT  02/06/2022   Procedure: PANCREATIC STENT PLACEMENT;  Surgeon: Meridee Score Netty Starring., MD;  Location: [facility] ENDOSCOPY;  Service: Gastroenterology;;  . Vella Redhead ANGIOPLASTY Right 07/14/2013   Procedure: PATCH ANGIOPLASTY;  Surgeon: Chuck Hint, MD;  Location: [facility] OR;  Service: Vascular;  Laterality: Right;  . PATCH ANGIOPLASTY Left 08/30/2015   Procedure: VEIN PATCH ANGIOPLASTY OF PROXIMAL Left BYPASS GRAFT;  Surgeon: Chuck Hint, MD;  Location: [facility] OR;  Service: Vascular;  Laterality: Left;  . PERIPHERAL VASCULAR CATHETERIZATION N/A 08/29/2015   Procedure: Abdominal Aortogram;   Surgeon: Chuck Hint, MD;  Location: [facility] INVASIVE CV LAB;  Service: Cardiovascular;  Laterality: N/A;  . POLYPECTOMY  06/25/2018   Procedure: POLYPECTOMY;  Surgeon: Meridee Score Netty Starring., MD;  Location: [facility] ENDOSCOPY;  Service: Gastroenterology;;  . POLYPECTOMY    . POLYPECTOMY  02/13/2022   Procedure: POLYPECTOMY;  Surgeon: Shellia Cleverly, DO;  Location: [facility] ENDOSCOPY;  Service: Gastroenterology;;  . REMOVAL OF STONES  02/06/2022   Procedure: REMOVAL OF STONES;  Surgeon: Lemar Lofty., MD;  Location: [facility] ENDOSCOPY;  Service: Gastroenterology;;  . Susa Day  02/13/2022   Procedure: Susa Day;  Surgeon: Shellia Cleverly, DO;  Location: [facility] ENDOSCOPY;  Service: Gastroenterology;;  . Dennison Mascot  02/06/2022   Procedure: SPHINCTEROTOMY;  Surgeon: Lemar Lofty., MD;  Location: [facility] ENDOSCOPY;  Service: Gastroenterology;;  . Sunnie Nielsen INJECTION  06/25/2018   Procedure: SUBMUCOSAL INJECTION;  Surgeon: Lemar Lofty., MD;  Location: [facility] ENDOSCOPY;  Service: Gastroenterology;;  . TEE WITHOUT CARDIOVERSION N/A 02/14/2018   Procedure: TRANSESOPHAGEAL ECHOCARDIOGRAM (TEE);  Surgeon: Laurey Morale, MD;  Location: [facility] ENDOSCOPY;  Service: Cardiovascular;  Laterality: N/A;  . trabecular surgery  OS  . TRANSTHORACIC ECHOCARDIOGRAM  12-29-2012  DR [facility]   MILD LVH/  LVSF NORMAL/ EF 55-60%/  GRADE I DIASTOLIC DYSFUNCTION  . VIDEO BRONCHOSCOPY WITH RADIAL ENDOBRONCHIAL ULTRASOUND  03/26/2023   Procedure: VIDEO BRONCHOSCOPY WITH RADIAL ENDOBRONCHIAL ULTRASOUND;  Surgeon: [name], MD;  Location: [facility] ENDOSCOPY;  Service: Pulmonary;;    Social History:   reports that he quit smoking about 9 years ago. His smoking use included cigarettes. He started smoking about 59 years ago. He has a 25 pack-year smoking history. He has never been exposed to tobacco smoke. He has never used smokeless tobacco. He reports current alcohol use of about 4.0 standard drinks of  alcohol per week. He reports that he does not use drugs.  Allergies  Allergen Reactions  . Niacin-Lovastatin Er Shortness Of Breath and Other (See Comments)    Dyspnea Flushing  *Not listed on MAR   . Penicillins Hives, Shortness Of Breath and Other (See Comments)    Dyspnea Flushing Because of a history of documented adverse serious drug reaction;Medi Alert bracelet  is recommended PCN reaction causing immediate rash, facial/tongue/throat swelling, SOB or lightheadedness with hypotension: Yes PCN reaction causing severe rash involving mucus membranes or skin necrosis: No PCN reaction occurring within the last 10 years: NO PCN reaction that required hospitalization: NO  . Sulfonamide Derivatives Hives, Shortness Of Breath and Other (See Comments)    Dyspnea  Flushing Because of a history of documented adverse serious drug reaction;Medi Alert bracelet  is recommended  . Lipitor [Atorvastatin] Other (See Comments)    Myalgias Arthralgias   *Not listed on MAR     Family History  Problem Relation Age of Onset  . Stroke Mother        mini strokes  . Alcohol abuse Father   . Heart disease Father        MI after 43  . Stroke Father   . Hypertension Father   . Heart attack Father   . Heart disease Paternal Aunt        several  . Hypertension Paternal Aunt        several  . Stroke Paternal Aunt        several  . Stroke Paternal Uncle        several  . Heart disease Paternal Uncle        several;2 had MI pre 68  . Cancer Daughter 64       breast ca, also with benign sessile polyp   . Colon cancer Daughter 39  . Colon polyps Neg Hx   . Esophageal cancer Neg Hx   . Rectal cancer Neg Hx   . Stomach cancer Neg Hx      Prior to Admission medications   Medication Sig Start Date End Date Taking? Authorizing Provider  acetaminophen (TYLENOL) 325 MG tablet Take 650 mg by mouth every 6 (six) hours as needed for moderate pain or headache.   Yes [provider]   acetaminophen (TYLENOL) 500 MG tablet Take 1 tablet (500 mg total) by mouth every 6 (six) hours as needed. 08/26/23  Yes Theresia Lo, Benetta Spar K, DO  albuterol (VENTOLIN HFA) 108 (90 Base) MCG/ACT inhaler Inhale 2 puffs into the lungs every 6 (six) hours as needed for wheezing or shortness of breath.   Yes [provider]  amLODipine (NORVASC) 10 MG tablet Take 10 mg by mouth daily.   Yes [provider]  apixaban (ELIQUIS) 2.5 MG TABS tablet Take 1 tablet (2.5  mg total) by mouth 2 (two) times daily. Okay to restart this medication on 03/27/2023 03/26/23  Yes [name], MD  Brinzolamide-Brimonidine [facility]) 1-0.2 % SUSP Place 1 drop into both eyes 3 (three) times daily. 8a, 2p, 8p   Yes [provider]  Budeson-Glycopyrrol-Formoterol (BREZTRI AEROSPHERE) 160-9-4.8 MCG/ACT AERO Inhale 2 puffs into the lungs in the morning and at bedtime. 07/27/21  Yes Icard, Bradley L, DO  diclofenac Sodium (VOLTAREN) 1 % GEL Apply 2 g topically 4 (four) times daily. Patient taking differently: Apply 2 g topically in the morning and at bedtime. 06/07/23  Yes Burns, Bobette Mo, MD  docusate sodium (COLACE) 100 MG capsule Take 100 mg by mouth 2 (two) times daily as needed for moderate constipation.   Yes [provider]  escitalopram (LEXAPRO) 20 MG tablet Take 1 tablet (20 mg total) by mouth daily. 06/07/23  Yes Burns, Bobette Mo, MD  furosemide (LASIX) 20 MG tablet Take 80 mg by mouth daily.   Yes [provider]  JARDIANCE 10 MG TABS tablet TAKE ONE TABLET BY MOUTH EVERY DAY BEFORE BREAKFAST. 01/08/22  Yes Laurey Morale, MD  latanoprost (XALATAN) 0.005 % ophthalmic solution Place 1 drop into both eyes at bedtime.   Yes [provider]  lidocaine (LIDODERM) 5 % Place 1 patch onto the skin daily. Remove & Discard patch within 12 hours or as directed by MD 08/26/23  Yes Theresia Lo, Cecile Sheerer, DO  loperamide (IMODIUM A-D) 2 MG tablet Take 2 mg by mouth 2 (two) times daily  as needed for diarrhea or loose stools.   Yes [provider]  Melatonin 10 MG TABS Take 20 mg by mouth at bedtime.   Yes [provider]  metoprolol succinate (TOPROL-XL) 25 MG 24 hr tablet Take 0.5 tablets (12.5 mg total) by mouth daily. 04/30/23  Yes Laurey Morale, MD  montelukast (SINGULAIR) 10 MG tablet TAKE ONE TABLET BY MOUTH AT BEDTIME Patient taking differently: Take 10 mg by mouth daily. 08/20/22  Yes Burns, Bobette Mo, MD  omeprazole (PRILOSEC) 20 MG capsule Take 20 mg by mouth daily. 05/15/23  Yes [provider]  Polyethyl Glycol-Propyl Glycol (SYSTANE ULTRA) 0.4-0.3 % SOLN Place 1 drop into both eyes daily as needed (dry eyes).   Yes [provider]  potassium chloride SA (KLOR-CON M) 20 MEQ tablet TAKE TWO TABLETS BY MOUTH IN THE MORNING AND TAKE ONE TABLET IN THE EVENING Patient taking differently: Take 40 mEq by mouth daily. 05/10/22  Yes Laurey Morale, MD  rosuvastatin (CRESTOR) 40 MG tablet TAKE ONE TABLET BY MOUTH EVERY DAY 02/09/22  Yes Laurey Morale, MD  senna-docusate (SENOKOT-S) 8.6-50 MG tablet Take 1 tablet by mouth daily.   Yes [provider]  spironolactone (ALDACTONE) 25 MG tablet Take 25 mg by mouth daily.   Yes [provider]  PRESCRIPTION MEDICATION Pt uses CPAP machine at bedtime    [provider]    Physical Exam: Vitals:   09/02/23 2130 09/02/23 2145 09/02/23 2200 09/02/23 2204  BP: 123/68  (!) 140/72   Pulse: 80 77 77   Resp: 18 18 (!) 24   Temp:    97.6 F (36.4 C)  TempSrc:    Oral  SpO2: 90% 91% (!) 86%   Weight:      Height:         Constitutional: NAD, calm  Eyes: PERTLA, lids and conjunctivae normal ENMT: Mucous membranes are moist. Posterior pharynx clear of any  exudate or lesions.   Neck: supple, no masses  Respiratory: clear to auscultation bilaterally, no wheezing, no crackles. No accessory muscle use.  Cardiovascular: S1 & S2 heard, regular rate and rhythm. No  extremity edema. No significant JVD. Abdomen: No distension, no tenderness, soft. Bowel sounds active.  Musculoskeletal: no clubbing / cyanosis. No joint deformity upper and lower extremities.   Skin: no significant rashes, lesions, ulcers. Warm, dry, well-perfused. Neurologic: CN 2-12 grossly intact. Sensation intact, DTR normal. Strength 5/5 in all 4 limbs. Alert and oriented.  Psychiatric: Pleasant. Cooperative.    Labs and Imaging on Admission: I have personally reviewed following labs and imaging studies  CBC: Recent Labs  Lab 09/02/23 1730  WBC 10.5  NEUTROABS 9.9*  HGB 15.1  HCT 44.4  MCV 88.8  PLT 343   Basic Metabolic Panel: Recent Labs  Lab 09/02/23 1730  NA 135  K 3.3*  CL 100  CO2 22  GLUCOSE 145*  BUN 22  CREATININE 1.56*  CALCIUM 9.3  MG 2.7*   GFR: Estimated Creatinine Clearance: 39.4 mL/min (A) (by C-G formula based on SCr of 1.56 mg/dL (H)). Liver Function Tests: Recent Labs  Lab 09/02/23 1730  AST 16  ALT 13  ALKPHOS 89  BILITOT 1.0  PROT 8.1  ALBUMIN 3.8   No results for input(s): "LIPASE", "AMYLASE" in the last 168 hours. No results for input(s): "AMMONIA" in the last 168 hours. Coagulation Profile: Recent Labs  Lab 09/02/23 1730  INR 1.2   Cardiac Enzymes: No results for input(s): "CKTOTAL", "CKMB", "CKMBINDEX", "TROPONINI" in the last 168 hours. BNP (last 3 results) Recent Labs    07/31/23 1201  PROBNP 211.0*   HbA1C: No results for input(s): "HGBA1C" in the last 72 hours. CBG: No results for input(s): "GLUCAP" in the last 168 hours. Lipid Profile: No results for input(s): "CHOL", "HDL", "LDLCALC", "TRIG", "CHOLHDL", "LDLDIRECT" in the last 72 hours. Thyroid Function Tests: No results for input(s): "TSH", "T4TOTAL", "FREET4", "T3FREE", "THYROIDAB" in the last 72 hours. Anemia Panel: No results for input(s): "VITAMINB12", "FOLATE", "FERRITIN", "TIBC", "IRON", "RETICCTPCT" in the last 72 hours. Urine analysis:     Component Value Date/Time   COLORURINE YELLOW 08/26/2023 0937   APPEARANCEUR CLEAR 08/26/2023 0937   LABSPEC 1.021 08/26/2023 0937   PHURINE 6.0 08/26/2023 0937   GLUCOSEU >=500 (A) 08/26/2023 0937   HGBUR NEGATIVE 08/26/2023 0937   BILIRUBINUR NEGATIVE 08/26/2023 0937   KETONESUR NEGATIVE 08/26/2023 0937   PROTEINUR 100 (A) 08/26/2023 0937   UROBILINOGEN 1.0 07/10/2013 1500   NITRITE NEGATIVE 08/26/2023 0937   LEUKOCYTESUR NEGATIVE 08/26/2023 0937   Sepsis Labs: @LABRCNTIP (procalcitonin:4,lacticidven:4) )No results found for this or any previous visit (from the past 240 hour(s)).   Radiological Exams on Admission: DG Chest 2 View  Result Date: 09/02/2023 CLINICAL DATA:  Suspected sepsis. New onset vomiting, weakness, and tremors. History of lung cancer not on treatment. EXAM: CHEST - 2 VIEW COMPARISON:  08/26/2023 FINDINGS: Shallow inspiration. Heart size and pulmonary vascularity are likely normal for technique. There is infiltration or atelectasis in the right mid lung with small right pleural effusion. This appears similar to the prior study and could indicate atelectasis or pneumonia. Mild perihilar infiltration bilaterally may represent edema. No pneumothorax. Mediastinal contours appear intact. IMPRESSION: 1. Shallow inspiration. 2. Persistent small right pleural effusion with infiltration in the right lower lung, possibly infection or atelectasis. 3. Perihilar infiltration may indicate superimposed edema Electronically Signed   By: Burman Nieves M.D.   On: 09/02/2023 21:36  EKG: Independently reviewed. ***  Assessment/Plan Principal Problem:   Pneumonia Active Problems:   Colon adenocarcinoma (HCC)   Squamous cell lung cancer (HCC)   PAD (peripheral artery disease) (HCC)   Chronic renal disease, stage 3, moderately decreased glomerular filtration rate between 30-59 mL/min/1.73 square meter (HCC)   COPD GOLD II    PAF (paroxysmal atrial fibrillation) (HCC)   OSA  (obstructive sleep apnea)   Benign essential HTN   Chronic diastolic CHF (congestive heart failure) (HCC)   Pleural effusion on right   Hypokalemia   Acute respiratory failure with hypoxia (HCC)   Nausea & vomiting    1.  -  -  -  -  -  -  -  -   2.  -  -  -  -  -  -  -   3.  -  -  -  -  -  -  -   4.  -  -  -  -  -  -  -  -   5.  -  -  -  -  -  -  -   6.  -  -  -  -  -   7.  -  -  -  -  -  -   8.  -  -  -  -  -  -     DVT prophylaxis: ***  Code Status: ***  Level of Care: Level of care: Telemetry Family Communication: ***  Disposition Plan:  Patient is from: ***  Anticipated d/c is to: *** Anticipated d/c date is: *** Patient currently: ***  Consults called: ***  Admission status: ***    Briscoe Deutscher, MD [facility]  09/02/2023, 11:53 PM

## 2023-09-03 NOTE — Progress Notes (Addendum)
PROGRESS NOTE    Troy Hogancamp [name]  BMW:[id number] DOB: [birth year] DOA: 09/02/2023 PCP: Pincus Sanes, MD    Brief Narrative:  Troy Bruce [name] is a 82 y.o. 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   Family was contacted by nursing home personnel at this afternoon due to a severe episode of vomiting.  Patient denies any abdominal pain or diarrhea.  In the ED, patient was afebrile.  Was slightly tachypneic.  Vitals were stable.  Labs showed hypokalemia with potassium of 3.3 with a creatinine elevation at 1.5.  Lactate was 1.9.  Urinalysis was negative for infection.  COVID influenza and RSV was negative.  Chest x-ray showed small pleural effusion with infiltrate in the right lower lobe and perihilar infiltration.  Blood cultures were collected in the ED, patient was given 40 mg of Lasix and Levaquin and was admitted hospital for further treatment.    Assessment/Plan     Pneumonia; acute hypoxic respiratory failure  Continue oxygen and nebulizer supportive care.  Was started on Levaquin will change to Rocephin and Zithromax.  Strep pneumo urinary antigen negative, pending legionella antigens.  Blood cultures negative in less than 12 hours.  Lactate was 1.5.  Patient without leukocytosis.  Currently on 3 L of oxygen by nasal cannula.  Will continue to wean as able.  Vomiting.  Will continue to monitor.  Antiemetics.  UA was negative for infection.  No further episodes reported this morning.  Hypertension.  Blood pressure seems to stable at this time. On amlodipine.  Will continue.  Chronic diastolic CHF   On diuretics.   Continue intake and output charting Daily weights.  Appears to be compensated.  Received 1 dose of IV Lasix.  On Lasix 80 mg daily.   Paroxysmal atrial fibrillation. Rate controlled at this time.  Continue Eliquis and metoprolol.   COPD;  Continue oxygen and nebulizers.  On supplemental oxygen at 3 L/min at this time.  OSA  Continue CPAP at nighttime.   PAD  Stable. Continue Eliquis and Crestor      CKD 3B  At baseline.  Check BMP in AM.   Hypokalemia  Initial potassium of 3.3.  Was replenished.  Has improved to 3.6 at this time.  Check BMP in AM.   History of lung cancer  - Hx of right lower lobectomy, now under observation for recurrence involving RUL   Diabetes mellitus type 2.  Hemoglobin A1c of 6.6.  Previous hemoglobin A1c 01/14/2023 was 6.6.  Had some hyperglycemia on presentation.  Will put the patient on sliding scale insulin.   Deconditioning, debility.  Patient states that he is at assisted living facility.  Will get PT OT evaluation.  Requires ADLs as per the patient.    DVT prophylaxis: apixaban (ELIQUIS) tablet 2.5 mg Start: 09/03/23 0045 apixaban (ELIQUIS) tablet 2.5 mg   Code Status:     Code Status: Limited: Do not attempt resuscitation (DNR) -DNR-LIMITED -Do Not Intubate/DNI   Disposition: Patient from skilled nursing facility.  Will get PT OT evaluation.  Status is: Inpatient Remains inpatient appropriate because: Hypoxic respiratory failure, IV antibiotics, patient from skilled nursing facility   Family Communication: Spoke  with the patient's daughter on the phone and updated her about the clinical condition of the patient.  Consultants:  None  Procedures:  None  Antimicrobials:  Levaquin IV, will change to Rocephin and Zithromax  Anti-infectives (From admission, onward)    Start     Dose/Rate Route Frequency Ordered Stop   09/04/23 2300  levofloxacin (LEVAQUIN) IVPB 750 mg  Status:  Discontinued        750 mg 100 mL/hr over 90 Minutes  Intravenous Every 48 hours 09/02/23 2352 09/03/23 1045   09/03/23 2200  azithromycin (ZITHROMAX) 500 mg in sodium chloride 0.9 % 250 mL IVPB        500 mg 250 mL/hr over 60 Minutes Intravenous Every 24 hours 09/03/23 1045     09/03/23 2200  cefTRIAXone (ROCEPHIN) 2 g in sodium chloride 0.9 % 100 mL IVPB        2 g 200 mL/hr over 30 Minutes Intravenous Every 24 hours 09/03/23 1045     09/02/23 2215  levofloxacin (LEVAQUIN) IVPB 500 mg  Status:  Discontinued        500 mg 100 mL/hr over 60 Minutes Intravenous Every 24 hours 09/02/23 2209 09/02/23 2353        Subjective: Today, patient was seen and examined at bedside.  Advertising copywriter.  Patient denies dyspnea has some shortness of breath and cough.  Denies any chest pain.  No abdominal pain fever or chills.  Has been eating some.  Objective: Vitals:   09/03/23 0335 09/03/23 0337 09/03/23 0844 09/03/23 1322  BP: 131/67   129/68  Pulse: 78   74  Resp: (!) 22   18  Temp: 98.5 F (36.9 C)   97.7 F (36.5 C)  TempSrc: Oral   Oral  SpO2: 95%  95% 93%  Weight:  75.8 kg    Height:        Intake/Output Summary (Last 24 hours) at 09/03/2023 1400 Last data filed at 09/03/2023 0800 Gross per 24 hour  Intake 540 ml  Output 300 ml  Net 240 ml   Filed Weights   09/02/23 1651 09/03/23 0337  Weight: 76.3 kg 75.8 kg    Physical Examination: Body mass index is 22.05 kg/m.  General:  Average built, not in obvious distress, appears chronically ill deconditioned and weak, on nasal cannula oxygen HENT:   No scleral pallor or icterus noted. Oral mucosa is moist.  Chest:  Diminished breath sounds bilaterally.  CVS: S1 &S2 heard. No murmur.  Regular rate and rhythm. Abdomen: Soft, nontender, nondistended.  Bowel sounds are heard.   Extremities: No cyanosis, clubbing or edema.  Peripheral pulses are palpable. Psych: Alert, awake and oriented, normal mood CNS:  No cranial nerve deficits.  Power equal in all extremities.   Skin: Warm and dry.   Skin with multiple areas of superficial bruising.  Data Reviewed:   CBC: Recent Labs  Lab 09/02/23 1730 09/03/23 0426  WBC 10.5 7.8  NEUTROABS 9.9*  --   HGB 15.1 13.9  HCT 44.4 43.6  MCV 88.8 91.0  PLT 343 277    Basic Metabolic Panel: Recent Labs  Lab 09/02/23 1730 09/03/23 0426  NA 135 134*  K 3.3* 3.9  CL 100 103  CO2 22 23  GLUCOSE 145* 172*  BUN 22 24*  CREATININE 1.56* 1.59*  CALCIUM 9.3 8.7*  MG 2.7*  --     Liver Function Tests: Recent Labs  Lab 09/02/23 1730  AST 16  ALT 13  ALKPHOS  89  BILITOT 1.0  PROT 8.1  ALBUMIN 3.8     Radiology Studies: DG Chest 2 View  Result Date: 09/02/2023 CLINICAL DATA:  Suspected sepsis. New onset vomiting, weakness, and tremors. History of lung cancer not on treatment. EXAM: CHEST - 2 VIEW COMPARISON:  08/26/2023 FINDINGS: Shallow inspiration. Heart size and pulmonary vascularity are likely normal for technique. There is infiltration or atelectasis in the right mid lung with small right pleural effusion. This appears similar to the prior study and could indicate atelectasis or pneumonia. Mild perihilar infiltration bilaterally may represent edema. No pneumothorax. Mediastinal contours appear intact. IMPRESSION: 1. Shallow inspiration. 2. Persistent small right pleural effusion with infiltration in the right lower lung, possibly infection or atelectasis. 3. Perihilar infiltration may indicate superimposed edema Electronically Signed   By: Burman Nieves M.D.   On: 09/02/2023 21:36      LOS: 1 day     Joycelyn Das, MD [facility] Available via Epic secure chat 7am-7pm After these hours, please refer to coverage provider listed on [email or web address] 09/03/2023, 2:00 PM

## 2023-09-03 NOTE — Evaluation (Signed)
Occupational Therapy Evaluation Patient Details Name: Troy Wommack [name] MRN: [id number] DOB: Nov 10, 1940 Today's Date: 09/03/2023   History of Present Illness Patient is a 82 year old male who presented form LTC with vomiting, SOB and weakness. Patient was admitted with pneumonia, acute hypoxic respiratory failure, chronic diastolic CHF, PMH: lung cancer, CKD III, COPD, PAF, hypokalemia, OSA, h/o R lower lobectomy   Clinical Impression   Patient is a 82 year old male who presented for above. Patient was living at [facility] with caregiver support prior level. Currently, patient is +2 for standing to transfer into recliner with strong posterior leaning and step by step cues for sequencing of all tasks. Patient noted to have limited vision in L eye and no vision in R eye impacting participation in ADLs as well. Patient was noted to have decreased functional activity tolerance, decreased endurance, decreased standing balance, decreased safety awareness, and decreased knowledge of AD/AE impacting participation in ADLs. Patient will benefit from continued inpatient follow up therapy, <3 hours/day.       If plan is discharge home, recommend the following: Two people to help with walking and/or transfers;A lot of help with bathing/dressing/bathroom;Assistance with cooking/housework;Direct supervision/assist for medications management;Assist for transportation;Direct supervision/assist for financial management;Assistance with feeding;Help with stairs or ramp for entrance    Functional Status Assessment  Patient has had a recent decline in their functional status and/or demonstrates limited ability to make significant improvements in function in a reasonable and predictable amount of time  Equipment Recommendations  None recommended by OT       Precautions / Restrictions Precautions Precaution Comments: R eye no vision, L eye low vision Restrictions Weight Bearing Restrictions: No       Mobility Bed Mobility Overal bed mobility: Needs Assistance Bed Mobility: Supine to Sit     Supine to sit: Max assist, +2 for physical assistance, +2 for safety/equipment     General bed mobility comments: with cues for advancing to EOB           Balance Overall balance assessment: Needs assistance Sitting-balance support: Feet supported, Single extremity supported Sitting balance-Leahy Scale: Fair     Standing balance support: Reliant on assistive device for balance, Bilateral upper extremity supported Standing balance-Leahy Scale: Poor           ADL either performed or assessed with clinical judgement   ADL Overall ADL's : Needs assistance/impaired Eating/Feeding: Set up;Sitting;Supervision/ safety   Grooming: Minimal assistance;Bed level   Upper Body Bathing: Bed level;Minimal assistance   Lower Body Bathing: Bed level;Total assistance   Upper Body Dressing : Bed level;Minimal assistance   Lower Body Dressing: Bed level;Total assistance   Toilet Transfer: +2 for physical assistance;+2 for safety/equipment;Maximal assistance;Rolling walker (2 wheels);Stand-pivot Statistician Details (indicate cue type and reason): to recliner with increased cues for visual deficits and to advance each foot with physical A to weight shift. patient noted to have posterior leaning as well. Toileting- Clothing Manipulation and Hygiene: Total assistance;Bed level               Vision Ability to See in Adequate Light: 4 Severely impaired (on R side) Patient Visual Report: No change from baseline Additional Comments: L eye has low vision, R eye is severely impaired. patient did not demonstrate any compensatory strategies for this issue during session.            Pertinent Vitals/Pain Pain Assessment Pain Assessment: Faces Faces Pain Scale: No hurt Pain Intervention(s):  (noted to have two lidocane  patches on back)     Extremity/Trunk Assessment Upper Extremity  Assessment Upper Extremity Assessment: Generalized weakness;Right hand dominant   Lower Extremity Assessment Lower Extremity Assessment: Defer to PT evaluation   Cervical / Trunk Assessment Cervical / Trunk Assessment: Kyphotic   Communication Communication Communication: No apparent difficulties   Cognition Arousal: Alert Behavior During Therapy: Flat affect Overall Cognitive Status: No family/caregiver present to determine baseline cognitive functioning             General Comments: patient knew he was in the hospital, did not know the date. able to follow directions during session and vocalize needs.                Home Living Family/patient expects to be discharged to:: Assisted living           Home Equipment: Wheelchair - manual;Standard Walker;Grab bars - tub/shower          Prior Functioning/Environment Prior Level of Function : Needs assist;Patient poor historian/Family not available             Mobility Comments: patient reported using RW ADLs Comments: patient reported having caregiver help with ADLs as needed.        OT Problem List: Decreased activity tolerance;Impaired balance (sitting and/or standing);Decreased coordination;Decreased safety awareness;Decreased knowledge of precautions;Decreased knowledge of use of DME or AE      OT Treatment/Interventions: Self-care/ADL training;Therapeutic exercise;DME and/or AE instruction;Therapeutic activities;Patient/family education;Balance training    OT Goals(Current goals can be found in the care plan section) Acute Rehab OT Goals Patient Stated Goal: none stated OT Goal Formulation: Patient unable to participate in goal setting Time For Goal Achievement: 09/17/23 Potential to Achieve Goals: Fair  OT Frequency: Min 1X/week    Co-evaluation PT/OT/SLP Co-Evaluation/Treatment: Yes Reason for Co-Treatment: To address functional/ADL transfers PT goals addressed during session: Mobility/safety  with mobility OT goals addressed during session: ADL's and self-care      AM-PAC OT "6 Clicks" Daily Activity     Outcome Measure Help from another person eating meals?: A Little Help from another person taking care of personal grooming?: A Lot Help from another person toileting, which includes using toliet, bedpan, or urinal?: Total Help from another person bathing (including washing, rinsing, drying)?: A Lot Help from another person to put on and taking off regular upper body clothing?: A Lot Help from another person to put on and taking off regular lower body clothing?: A Lot 6 Click Score: 12   End of Session Equipment Utilized During Treatment: Gait belt;Rolling walker (2 wheels) Nurse Communication: Mobility status  Activity Tolerance: Patient limited by fatigue Patient left: in chair;with call bell/phone within reach;with chair alarm set  OT Visit Diagnosis: Unsteadiness on feet (R26.81);Other abnormalities of gait and mobility (R26.89);Muscle weakness (generalized) (M62.81)                Time: 1610-9604 OT Time Calculation (min): 19 min Charges:  OT General Charges $OT Visit: 1 Visit OT Evaluation $OT Eval Moderate Complexity: 1 Mod  Troy [name] OTR/L, MS Acute Rehabilitation Department Office# [phone]   Troy [name] 09/03/2023, 12:43 PM

## 2023-09-03 NOTE — Hospital Course (Signed)
Troy Abdou Dottery [name] is a 82 y.o. 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   Family was contacted by nursing home personnel at this afternoon due to a severe episode of vomiting.  Patient denies any abdominal pain or diarrhea.  In the ED, patient was afebrile.  Was slightly tachypneic.  Vitals were stable.  Labs showed hypokalemia with potassium of 3.3 with a creatinine elevation at 1.5.  Lactate was 1.9.  Urinalysis was negative for infection.  COVID influenza and RSV was negative.  Chest x-ray showed small pleural effusion with infiltrate in the right lower lobe and perihilar infiltration.  Blood cultures were collected in the ED, patient was given 40 mg of Lasix and Levaquin and was admitted hospital for further treatment.    Assessment/Plan     Pneumonia; acute hypoxic respiratory failure  Continue oxygen and nebulizer supportive care.  Was started on Levaquin will continue for now.  Check l strep pneumo urinary antigen negative, pending legionella antigens.  Blood cultures negative in less than 12 hours.  Lactate was 1.5.  Patient without leukocytosis.  Vomiting.  Will continue to monitor.  Antiemetics.  UA was negative for infection.  Chronic diastolic CHF   On diuretics.  Continue intake and output charting Daily weights.   Paroxysmal atrial fibrillation. Rate controlled at this time.  Continue Eliquis.   COPD;  Continue oxygen and nebulizers.  OSA  Continue CPAP at nighttime.   PAD  Stable. Continue Eliquis and Crestor      CKD 3B  At baseline.   Hypokalemia  Initial potassium of 3.3.  Was replenished.  Has improved to  3.6 at this time.   Lung cancer  - Hx of right lower lobectomy, now under observation for recurrence involving RUL   Diabetes mellitus type 2.  Hemoglobin A1c of 6.6.  Previous hemoglobin A1c 01/14/2023 was 6.6.  Had some hyperglycemia on presentation.  Will put the patient on sliding scale insulin.

## 2023-09-03 NOTE — Evaluation (Signed)
Physical Therapy Evaluation Patient Details Name: Troy [name]. MRN: [id number] DOB: Apr 21, 1941 Today's Date: 09/03/2023  History of Present Illness  Patient is a 82 year old male who presented form LTC with vomiting, SOB and weakness. Patient was admitted with pneumonia, acute hypoxic respiratory failure, chronic diastolic CHF, PMH: lung cancer, CKD III, COPD, PAF, hypokalemia, OSA, h/o R lower lobectomy  Clinical Impression  Pt admitted with above diagnosis.  Pt currently with functional limitations due to the deficits listed below (see PT Problem List). Pt will benefit from acute skilled PT to increase their independence and safety with mobility to allow discharge.    The patient  requires +2 max support for stand and pivot to recliner using Rw, difficulty stepping, wide base. [name] No family present  for Prior level of function. Patient does report visual deficits.  Patient will benefit from continued inpatient follow up therapy, <3 hours/day          If plan is discharge home, recommend the following: Two people to help with walking and/or transfers;Assistance with cooking/housework;Assist for transportation;Help with stairs or ramp for entrance;Two people to help with bathing/dressing/bathroom   Can travel by private vehicle   No    Equipment Recommendations None recommended by PT  Recommendations for Other Services       Functional Status Assessment Patient has had a recent decline in their functional status and demonstrates the ability to make significant improvements in function in a reasonable and predictable amount of time.     Precautions / Restrictions Precautions Precaution Comments: R eye no vision, L eye low vision Restrictions Weight Bearing Restrictions: No      Mobility  Bed Mobility                    Transfers Overall transfer level: Needs assistance Equipment used: Rolling walker (2 wheels) Transfers: Sit to/from Stand, Bed to  chair/wheelchair/BSC Sit to Stand: Max assist, +2 physical assistance, +2 safety/equipment           General transfer comment: max assist to rise from bed, max support to step  to recliner, facilitation to step LLE to move closer to reclier, assist to turn and to sit down    Ambulation/Gait                  Stairs            Wheelchair Mobility     Tilt Bed    Modified Rankin (Stroke Patients Only)       Balance     Sitting balance-Leahy Scale: Fair   Postural control: Posterior lean Standing balance support: Reliant on assistive device for balance, Bilateral upper extremity supported Standing balance-Leahy Scale: Poor                               Pertinent Vitals/Pain Pain Assessment Pain Assessment: No/denies pain    Home Living Family/patient expects to be discharged to:: Assisted living                 Home Equipment: Wheelchair - manual;Standard Walker;Grab bars - tub/shower      Prior Function Prior Level of Function : Needs assist;Patient poor historian/Family not available             Mobility Comments: patient reported using RW ADLs Comments: patient reported having caregiver help with ADLs as needed.     Extremity/Trunk Assessment   Upper Extremity  Assessment Upper Extremity Assessment: Defer to OT evaluation (noted tremors)    Lower Extremity Assessment Lower Extremity Assessment: Generalized weakness    Cervical / Trunk Assessment Cervical / Trunk Assessment: Kyphotic  Communication   Communication Communication: No apparent difficulties  Cognition Arousal: Alert Behavior During Therapy: Flat affect Overall Cognitive Status: No family/caregiver present to determine baseline cognitive functioning                                 General Comments: patient knew he was in the hospital, did not know the date. able to follow directions during session and vocalize needs.        General  Comments      Exercises     Assessment/Plan    PT Assessment Patient needs continued PT services  PT Problem List Decreased strength;Decreased mobility;Decreased safety awareness;Decreased range of motion;Decreased activity tolerance;Decreased balance;Decreased knowledge of use of DME       PT Treatment Interventions DME instruction;Therapeutic activities;Cognitive remediation;Gait training;Therapeutic exercise;Patient/family education;Functional mobility training;Balance training    PT Goals (Current goals can be found in the Care Plan section)  Acute Rehab PT Goals PT Goal Formulation: Patient unable to participate in goal setting Time For Goal Achievement: 09/17/23 Potential to Achieve Goals: Fair    Frequency Min 1X/week     Co-evaluation PT/OT/SLP Co-Evaluation/Treatment: Yes Reason for Co-Treatment: To address functional/ADL transfers PT goals addressed during session: Mobility/safety with mobility OT goals addressed during session: ADL's and self-care       AM-PAC PT "6 Clicks" Mobility  Outcome Measure Help needed turning from your back to your side while in a flat bed without using bedrails?: A Lot Help needed moving from lying on your back to sitting on the side of a flat bed without using bedrails?: A Lot Help needed moving to and from a bed to a chair (including a wheelchair)?: A Lot Help needed standing up from a chair using your arms (e.g., wheelchair or bedside chair)?: Total Help needed to walk in hospital room?: Total Help needed climbing 3-5 steps with a railing? : Total 6 Click Score: 9    End of Session Equipment Utilized During Treatment: Gait belt Activity Tolerance: Patient tolerated treatment well Patient left: in chair;with call bell/phone within reach;with chair alarm set Nurse Communication: Mobility status PT Visit Diagnosis: Unsteadiness on feet (R26.81);Muscle weakness (generalized) (M62.81);Difficulty in walking, not elsewhere classified  (R26.2)    Time: 4098-1191 PT Time Calculation (min) (ACUTE ONLY): 19 min   Charges:   PT Evaluation $PT Eval Low Complexity: 1 Low   PT General Charges $$ ACUTE PT VISIT: 1 Visit         Blanchard Kelch PT Acute Rehabilitation Services Office [phone] Weekend [phone]   Rada Hay 09/03/2023, 1:30 PM

## 2023-09-03 NOTE — Telephone Encounter (Signed)
12/3 Received secure chat from [employer] [place]. Pt is currently inpatient.  Reschedule Follow up consult after discharge.  Called and spoke to patient's daughter.  All appointments has been canceled and patient's daughter will call back to reschedule.

## 2023-09-04 ENCOUNTER — Ambulatory Visit: Payer: PPO | Admitting: Radiation Oncology

## 2023-09-04 ENCOUNTER — Ambulatory Visit: Payer: PPO

## 2023-09-04 DIAGNOSIS — J189 Pneumonia, unspecified organism: Secondary | ICD-10-CM | POA: Diagnosis not present

## 2023-09-04 DIAGNOSIS — G4733 Obstructive sleep apnea (adult) (pediatric): Secondary | ICD-10-CM | POA: Diagnosis not present

## 2023-09-04 DIAGNOSIS — I480 Paroxysmal atrial fibrillation: Secondary | ICD-10-CM

## 2023-09-04 DIAGNOSIS — J9601 Acute respiratory failure with hypoxia: Secondary | ICD-10-CM | POA: Diagnosis not present

## 2023-09-04 DIAGNOSIS — J90 Pleural effusion, not elsewhere classified: Secondary | ICD-10-CM

## 2023-09-04 LAB — BASIC METABOLIC PANEL
Anion gap: 13 (ref 5–15)
BUN: 31 mg/dL — ABNORMAL HIGH (ref 8–23)
CO2: 23 mmol/L (ref 22–32)
Calcium: 9.1 mg/dL (ref 8.9–10.3)
Chloride: 97 mmol/L — ABNORMAL LOW (ref 98–111)
Creatinine, Ser: 1.87 mg/dL — ABNORMAL HIGH (ref 0.61–1.24)
GFR, Estimated: 35 mL/min — ABNORMAL LOW (ref >60)
Glucose, Bld: 131 mg/dL — ABNORMAL HIGH (ref 70–99)
Potassium: 3.9 mmol/L (ref 3.5–5.1)
Sodium: 133 mmol/L — ABNORMAL LOW (ref 135–145)

## 2023-09-04 LAB — GLUCOSE, CAPILLARY
Glucose-Capillary: 130 mg/dL — ABNORMAL HIGH (ref 70–99)
Glucose-Capillary: 107 mg/dL — ABNORMAL HIGH (ref 70–99)
Glucose-Capillary: 133 mg/dL — ABNORMAL HIGH (ref 70–99)
Glucose-Capillary: 130 mg/dL — ABNORMAL HIGH (ref 70–99)

## 2023-09-04 LAB — CBC
HCT: 38.8 % — ABNORMAL LOW (ref 39.0–52.0)
Hemoglobin: 12.5 g/dL — ABNORMAL LOW (ref 13.0–17.0)
MCH: 29.6 pg (ref 26.0–34.0)
MCHC: 32.2 g/dL (ref 30.0–36.0)
MCV: 91.9 fL (ref 80.0–100.0)
Platelets: 253 10*3/uL (ref 150–400)
RBC: 4.22 MIL/uL (ref 4.22–5.81)
RDW: 16.1 % — ABNORMAL HIGH (ref 11.5–15.5)
WBC: 7.1 10*3/uL (ref 4.0–10.5)
nRBC: 0 % (ref 0.0–0.2)

## 2023-09-04 LAB — LEGIONELLA PNEUMOPHILA SEROGP 1 UR AG: L. pneumophila Serogp 1 Ur Ag: NEGATIVE

## 2023-09-04 LAB — MAGNESIUM: Magnesium: 2.5 mg/dL — ABNORMAL HIGH (ref 1.7–2.4)

## 2023-09-04 MED ORDER — SODIUM CHLORIDE 0.45 % IV SOLN: INTRAVENOUS | Status: AC

## 2023-09-04 MED ORDER — NYSTATIN 100000 UNIT/ML MT SUSP
5.0000 mL | Freq: Four times a day (QID) | OROMUCOSAL | Status: DC
  Administered 2023-09-04 – 2023-09-07 (×8): 500000 [IU] via OROMUCOSAL
  Filled 2023-09-04 (×8): qty 5

## 2023-09-04 NOTE — Evaluation (Signed)
Clinical/Bedside Swallow Evaluation Patient Details  Name: Troy Almasri [name] MRN: [id number] Date of Birth: [birth year]  Today's Date: 09/04/2023 Time: SLP Start Time (ACUTE ONLY): 1610 SLP Stop Time (ACUTE ONLY): 1650 SLP Time Calculation (min) (ACUTE ONLY): 40 min  Past Medical History:  Past Medical History:  Diagnosis Date   AAA (abdominal aortic aneurysm) (HCC) 10-20-17   MONITORED BY DR Edilia Bo, 3.7 CM by ABDOMINAL US 11/2021   Anxiety    Arthritis    Atrial fibrillation (HCC)    Basal cell carcinoma    BPH (benign prostatic hypertrophy)    Chronic diastolic heart failure (HCC)    Chronic kidney disease    stage 3, pt unaware   Colon cancer (HCC)    COPD (chronic obstructive pulmonary disease) (HCC)    Depression    GERD (gastroesophageal reflux disease)    Glaucoma BOTH EYES   Dr Nile Riggs   History of lung cancer APRIL 2012   ONCOLOGIST- DR Mariel Sleet  LOV IN [facility] 10-27-2012 SQUAMOUS CELL---- S/P RIGHT LOWER LOBECTOMY AT [facility] -- NO CHEMORADIATION--- NO RECURRENCE   Hx of adenomatous colonic polyps 2005    X 2; 1 hyperplastic polyp; Dr Juanda Chance   Hyperlipidemia    Hypertension    Nausea & vomiting 09/02/2023   OSA (obstructive sleep apnea) 08/29/2015   CPAP SET ON 10   Peripheral vascular disease (HCC)    FOLLOWED  BY DR DICKSON   Spinal stenosis 06/2014   Thoracic aorta atherosclerosis (HCC)    Thyrotoxicosis    amiodarone induced   Past Surgical History:  Past Surgical History:  Procedure Laterality Date   ABDOMINAL AORTOGRAM W/LOWER EXTREMITY N/A 12/20/2022   Procedure: ABDOMINAL AORTOGRAM W/LOWER EXTREMITY;  Surgeon: Cephus Shelling, MD;  [facility];  Service: Cardiovascular;  Laterality: N/A;   ANGIO PLASTY     X 4 in legs   AORTOGRAM  07-27-2002   MILD DIFFUSE ILIAC ARTERY OCCLUSIVE DISEASE /  LEFT RENAL ARTERY 20%/ PATENT LEFT FEM-POP GRAFT/ MILD SFA AND POPLITEAL ARTERY OCCLUSIVE DISEASE W/ SEVERE KIDNEY OCCLUSIVE DISEASE   ATRIAL  FIBRILLATION ABLATION N/A 04/09/2019   Procedure: ATRIAL FIBRILLATION ABLATION;  Surgeon: Hillis Range, MD;  [facility];  Service: Cardiovascular;  Laterality: N/A;   ATRIAL FIBRILLATION ABLATION N/A 02/12/2020   Procedure: ATRIAL FIBRILLATION ABLATION;  Surgeon: Hillis Range, MD;  [facility];  Service: Cardiovascular;  Laterality: N/A;   BALLOON DILATION N/A 02/06/2022   Procedure: BALLOON Tamponade ampulla;  Surgeon: Lemar Lofty., MD;  Location: [facility] ENDOSCOPY;  Service: Gastroenterology;  Laterality: N/A;   BASAL CELL CARCINOMA EXCISION     MULTIPLE TIMES--  RIGHT FOREARM, CHEEKS, AND BACK   BIOPSY  06/25/2018   Procedure: BIOPSY;  Surgeon: Meridee Score Netty Starring., MD;  Location: [facility] ENDOSCOPY;  Service: Gastroenterology;;   BIOPSY  02/06/2022   Procedure: BIOPSY;  Surgeon: Lemar Lofty., MD;  Location: [facility]OS[facility];  Service: Gastroenterology;;   BRONCHIAL BIOPSY  03/26/2023   Procedure: BRONCHIAL BIOPSIES;  Surgeon: [name], MD;  Location: [facility] ENDOSCOPY;  Service: Pulmonary;;   BRONCHIAL BRUSHINGS  03/26/2023   Procedure: BRONCHIAL BRUSHINGS;  Surgeon: [name], MD;  Location: [facility] ENDOSCOPY;  Service: Pulmonary;;   BRONCHIAL NEEDLE ASPIRATION BIOPSY  03/26/2023   Procedure: BRONCHIAL NEEDLE ASPIRATION BIOPSIES;  Surgeon: [name], MD;  Location: [facility] ENDOSCOPY;  Service: Pulmonary;;   BRONCHIAL WASHINGS  03/26/2023   Procedure: BRONCHIAL WASHINGS;  Surgeon: Levy Pupa  S, MD;  Location: [facility]OS[facility];  Service: Pulmonary;;   CARDIOVASCULAR STRESS TEST  12-08-2012  DR [facility]   NORMAL LEXISCAN WITH NO EXERCISE NUCLEAR STUDY/ EF 66%/   NO ISCHEMIA/ NO SIGNIFICANT CHANGE FROM PRIOR STUDY   CARDIOVERSION N/A 02/14/2018   Procedure: CARDIOVERSION;  Surgeon: Laurey Morale, MD;  Location: [facility] ENDOSCOPY;  Service: Cardiovascular;  Laterality: N/A;   CARDIOVERSION N/A 07/10/2022   Procedure: CARDIOVERSION;  Surgeon: Laurey Morale,  MD;  Location: [facility] ENDOSCOPY;  Service: Cardiovascular;  Laterality: N/A;   CAROTID ANGIOGRAM N/A 07/10/2013   Procedure: CAROTID ANGIOGRAM;  Surgeon: Sherren Kerns, MD;  Location: [facility] CATH LAB;  Service: Cardiovascular;  Laterality: N/A;   CAROTID ENDARTERECTOMY Right 07-14-13   cea   CATARACT EXTRACTION W/ INTRAOCULAR LENS  IMPLANT, BILATERAL     colon polyectomy     COLONOSCOPY     COLONOSCOPY WITH PROPOFOL N/A 06/25/2018   Procedure: COLONOSCOPY WITH PROPOFOL;  Surgeon: Meridee Score Netty Starring., MD;  Location: [facility] ENDOSCOPY;  Service: Gastroenterology;  Laterality: N/A;   CYSTOSCOPY W/ RETROGRADES Bilateral 01/21/2013   Procedure: CYSTOSCOPY WITH RETROGRADE PYELOGRAM;  Surgeon: Milford Cage, MD;  Location[facility];  Service: Urology;  Laterality: Bilateral;   CYSTO, BLADDER BIOPSY, BILATERAL RETROGRADE PYELOGRAM  RAD TECH FROM RADIOLOGY PER JOY   CYSTOSCOPY WITH BIOPSY N/A 01/21/2013   Procedure: CYSTOSCOPY WITH BIOPSY;  Surgeon: Milford Cage, MD;  Location: [facility];  Service: Urology;  Laterality: N/A;   ENDARTERECTOMY Right 07/14/2013   Procedure: ENDARTERECTOMY CAROTID;  Surgeon: Chuck Hint, MD;  Location: [facility] OR;  Service: Vascular;  Laterality: Right;   ENDOSCOPIC RETROGRADE CHOLANGIOPANCREATOGRAPHY (ERCP) WITH PROPOFOL N/A 02/06/2022   Procedure: ENDOSCOPIC RETROGRADE CHOLANGIOPANCREATOGRAPHY (ERCP) WITH PROPOFOL;  Surgeon: Lemar Lofty., MD;  Location: [facility] ENDOSCOPY;  Service: Gastroenterology;  Laterality: N/A;   EP IMPLANTABLE DEVICE N/A 08/12/2015   Procedure: Loop Recorder Insertion;  Surgeon: Hillis Range, MD;  [facility];  Service: Cardiovascular;  Laterality: N/A;   ESOPHAGOGASTRODUODENOSCOPY Left 02/13/2022   Procedure: ESOPHAGOGASTRODUODENOSCOPY (EGD);  Surgeon: Shellia Cleverly, DO;  Location: [facility] ENDOSCOPY;  Service: Gastroenterology;  Laterality: Left;   ESOPHAGOGASTRODUODENOSCOPY  (EGD) WITH PROPOFOL N/A 06/25/2018   Procedure: ESOPHAGOGASTRODUODENOSCOPY (EGD) WITH PROPOFOL;  Surgeon: Meridee Score Netty Starring., MD;  Location: [facility] ENDOSCOPY;  Service: Gastroenterology;  Laterality: N/A;   ESOPHAGOGASTRODUODENOSCOPY (EGD) WITH PROPOFOL N/A 07/10/2018   Procedure: ESOPHAGOGASTRODUODENOSCOPY (EGD) WITH PROPOFOL;  Surgeon: Rachael Fee, MD;  Location: [facility] ENDOSCOPY;  Service: Endoscopy;  Laterality: N/A;   EUS N/A 07/10/2018   Procedure: UPPER ENDOSCOPIC ULTRASOUND (EUS) RADIAL;  Surgeon: Rachael Fee, MD;  Location: [facility] ENDOSCOPY;  Service: Endoscopy;  Laterality: N/A;   EYE SURGERY Right    FEMORAL-POPLITEAL BYPASS GRAFT Left 1994  [facility]   AND 2001  IN [place]   FEMORAL-POPLITEAL BYPASS GRAFT Left 08/30/2015   Procedure: REVISION OF BYPASS GRAFT Left  FEMORAL-POPLITEAL ARTERY;  Surgeon: Chuck Hint, MD;  Location: [facility] OR;  Service: Vascular;  Laterality: Left;   FINE NEEDLE ASPIRATION N/A 07/10/2018   Procedure: FINE NEEDLE ASPIRATION (FNA) LINEAR;  Surgeon: Rachael Fee, MD;  Location: [facility] ENDOSCOPY;  Service: Endoscopy;  Laterality: N/A;   FLEXIBLE SIGMOIDOSCOPY N/A 12/12/2018   Procedure: FLEXIBLE SIGMOIDOSCOPY;  Surgeon: Andria Meuse, MD;  Location: [facility] ORS;  Service: General;  Laterality: N/A;   HEMOSTASIS CLIP PLACEMENT  02/13/2022   Procedure: HEMOSTASIS CLIP PLACEMENT;  Surgeon: Shellia Cleverly,  DO;  Location[facility]OS[facility];  Service: Gastroenterology;;   HEMOSTASIS CONTROL  03/26/2023   Procedure: HEMOSTASIS CONTROL;  Surgeon: [name], MD;  Location: [facility] ENDOSCOPY;  Service: Pulmonary;;   HOT HEMOSTASIS N/A 02/13/2022   Procedure: HOT HEMOSTASIS (ARGON PLASMA COAGULATION/BICAP);  Surgeon: Shellia Cleverly, DO;  Location: [facility] ENDOSCOPY;  Service: Gastroenterology;  Laterality: N/A;   LARYNGOSCOPY  06-27-2004   BX VOCAL CORD  (LEUKOPLAKIA)  PER PT NO ISSUES SINCE   LEFT HEART CATH AND CORONARY ANGIOGRAPHY N/A 08/20/2017   Procedure:  LEFT HEART CATH AND CORONARY ANGIOGRAPHY;  Surgeon: Laurey Morale, MD;  Location: [facility] INVASIVE CV LAB;  Service: Cardiovascular;  Laterality: N/A;   LOOP RECORDER INSERTION N/A 04/09/2019   Procedure: LOOP RECORDER INSERTION;  Surgeon: Hillis Range, MD;  [facility];  Service: Cardiovascular;  Laterality: N/A;   LOOP RECORDER REMOVAL N/A 04/09/2019   Procedure: LOOP RECORDER REMOVAL;  Surgeon: Hillis Range, MD;  [facility];  Service: Cardiovascular;  Laterality: N/A;   LOWER EXTREMITY ANGIOGRAM Bilateral 08/29/2015   Procedure: Lower Extremity Angiogram;  Surgeon: Chuck Hint, MD;  Location: [facility] INVASIVE CV LAB;  Service: Cardiovascular;  Laterality: Bilateral;   LUNG LOBECTOMY  01/24/2011    RIGHT UPPER LOBE  (SQUAMOUS CELL CARCINOMA) Dr Desmond Lope , [facility]. No chemotherapyor radiation   PANCREATIC STENT PLACEMENT  02/06/2022   Procedure: PANCREATIC STENT PLACEMENT;  Surgeon: Meridee Score Netty Starring., MD;  Location: [facility] ENDOSCOPY;  Service: Gastroenterology;;   [facility] ANGIOPLASTY Right 07/14/2013   Procedure: PATCH ANGIOPLASTY;  Surgeon: Chuck Hint, MD;  Location: [facility] OR;  Service: Vascular;  Laterality: Right;   PATCH ANGIOPLASTY Left 08/30/2015   Procedure: VEIN PATCH ANGIOPLASTY OF PROXIMAL Left BYPASS GRAFT;  Surgeon: Chuck Hint, MD;  Location: [facility] OR;  Service: Vascular;  Laterality: Left;   PERIPHERAL VASCULAR CATHETERIZATION N/A 08/29/2015   Procedure: Abdominal Aortogram;  Surgeon: Chuck Hint, MD;  Location: [facility] INVASIVE CV LAB;  Service: Cardiovascular;  Laterality: N/A;   POLYPECTOMY  06/25/2018   Procedure: POLYPECTOMY;  Surgeon: Meridee Score Netty Starring., MD;  Location: [facility] ENDOSCOPY;  Service: Gastroenterology;;   POLYPECTOMY     POLYPECTOMY  02/13/2022   Procedure: POLYPECTOMY;  Surgeon: Shellia Cleverly, DO;  [facility]OS[facility];  Service: Gastroenterology;;   REMOVAL OF STONES  02/06/2022   Procedure: REMOVAL OF  STONES;  Surgeon: Lemar Lofty., MD;  Location: [facility] ENDOSCOPY;  Service: Gastroenterology;;   Susa Day  02/13/2022   Procedure: Susa Day;  Surgeon: Shellia Cleverly, DO;  Location: [facility] ENDOSCOPY;  Service: Gastroenterology;;   Dennison Mascot  02/06/2022   Procedure: Dennison Mascot;  Surgeon: Lemar Lofty., MD;  Location: [facility] ENDOSCOPY;  Service: Gastroenterology;;   SUBMUCOSAL INJECTION  06/25/2018   Procedure: SUBMUCOSAL INJECTION;  Surgeon: Lemar Lofty., MD;  Location: [facility] ENDOSCOPY;  Service: Gastroenterology;;   TEE WITHOUT CARDIOVERSION N/A 02/14/2018   Procedure: TRANSESOPHAGEAL ECHOCARDIOGRAM (TEE);  Surgeon: Laurey Morale, MD;  Location: [facility] ENDOSCOPY;  Service: Cardiovascular;  Laterality: N/A;   trabecular surgery     OS   TRANSTHORACIC ECHOCARDIOGRAM  12-29-2012  DR [facility]   MILD LVH/  LVSF NORMAL/ EF 55-60%/  GRADE I DIASTOLIC DYSFUNCTION   VIDEO BRONCHOSCOPY WITH RADIAL ENDOBRONCHIAL ULTRASOUND  03/26/2023   Procedure: VIDEO BRONCHOSCOPY WITH RADIAL ENDOBRONCHIAL ULTRASOUND;  Surgeon: [name], MD;  Location: [facility] ENDOSCOPY;  Service: Pulmonary;;   HPI:  82 year old male who presented form LTC with vomiting, SOB  and weakness. Patient was admitted with pneumonia, acute hypoxic respiratory failure, chronic diastolic CHF, PMH: lung cancer, CKD III, COPD, PAF, hypokalemia, OSA, h/o R lower lobectomy, DNR. Shallow inspiration. 2. Persistent small right pleural effusion with infiltration in the right lower lung, possibly infection or atelectasis. 3. Perihilar infiltration may indicate superimposed edema. 08/26/2023 Chronic microvascular ischemic change and cerebral volume loss. 17 x 16 mm right perifissural nodule along the minor fissure, mildly progressive, raising the possibility of primary bronchogenic carcinoma versus metastasis.  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    Swallow evaluation  ordered.  Pt's caregiver of several years, "T" present and admits to pt having coughing with liquids more than solids over the last several weeks.    Assessment / Plan / Recommendation  Clinical Impression  Patient presenting with subtle indication of dysphagia -likely pharyngeal-pharyngoesophageal.  He also appears with white coating on tongue that did not clear fully despite aggressive oral care - concerning for oral candidiasis.  Pt has generalized weakness but no focal CN deficits.  Frequent throat clearing and belching noted after swallowing thin liquids on an inconsistent basis.  Pt with adequate mastication of salad and T (caregiver) reports he had had prolonged mastication.  No clinical indications of aspiration with Ensure, nor salad. "T" reports pt has been having difficulty swallowing liquids within the last few weeks - causing him to cough.  Denies him having significant issues with swallowing solids - but does state he masticates more thoroughly.  In discussion with pt in providing option of MBS vs compensation strategies- pt deferred to MD.  MD indicated via secure chat to proceed with MBS- Goal is to help determine least restrictive diet with most appropriate compensation strategies and potential exercises to maximize swallow function.  Will conduct MBS next date as ordered. SLP Visit Diagnosis: Dysphagia, oropharyngeal phase (R13.12)    Aspiration Risk  Moderate aspiration risk    Diet Recommendation Regular;Thin liquid    Liquid Administration via: Straw Medication Administration: Whole meds with puree Supervision: Staff to assist with self feeding Compensations: Slow rate;Small sips/bites Postural Changes: Seated upright at 90 degrees;Remain upright for at least 30 minutes after po intake    Other  Recommendations Oral Care Recommendations: Oral care BID    Recommendations for follow up therapy are one component of a multi-disciplinary discharge planning process, led by the  attending physician.  Recommendations may be updated based on patient status, additional functional criteria and insurance authorization.  Follow up Recommendations Follow physician's recommendations for discharge plan and follow up therapies      Assistance Recommended at Discharge  TBD  Functional Status Assessment Patient has had a recent decline in their functional status and demonstrates the ability to make significant improvements in function in a reasonable and predictable amount of time.  Frequency and Duration min 1 x/week  1 week       Prognosis Prognosis for improved oropharyngeal function: Good      Swallow Study   General HPI: 82 year old male who presented form LTC with vomiting, SOB and weakness. Patient was admitted with pneumonia, acute hypoxic respiratory failure, chronic diastolic CHF, PMH: lung cancer, CKD III, COPD, PAF, hypokalemia, OSA, h/o R lower lobectomy, DNR. Shallow inspiration. 2. Persistent small right pleural effusion with infiltration in the right lower lung, possibly infection or atelectasis. 3. Perihilar infiltration may indicate superimposed edema. 08/26/2023 Chronic microvascular ischemic change and cerebral volume loss. 17  x 16 mm right perifissural nodule along the minor fissure, mildly progressive, raising the possibility of primary bronchogenic carcinoma versus metastasis.  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    Swallow evaluation ordered.  Pt's caregiver of several years, "T" present and admits to pt having coughing with liquids more than solids over the last several weeks. Type of Study: Bedside Swallow Evaluation Previous Swallow Assessment: none in EPIC Diet Prior to this Study: Regular;Thin liquids (Level 0) Temperature Spikes Noted: No Respiratory Status: Nasal cannula History of Recent Intubation: No Behavior/Cognition: Alert;Cooperative;Pleasant mood Oral Cavity Assessment:  Dry;Erythema;Other (comment) (white patches on tongue, concerning for oral candidiasis) Oral Care Completed by SLP: Yes Oral Cavity - Dentition: Missing dentition;Other (Comment) (some dentition missing) Vision: Impaired for self-feeding (pt has macular degeneration) Self-Feeding Abilities: Needs assist;Other (Comment) (pt has worsening tremor currently vs baseline) Patient Positioning: Upright in bed Baseline Vocal Quality: Low vocal intensity Volitional Cough: Weak Volitional Swallow: Able to elicit    Oral/Motor/Sensory Function Overall Oral Motor/Sensory Function: Generalized oral weakness   Ice Chips Ice chips: Not tested   Thin Liquid Thin Liquid: Impaired Presentation: Straw;Self Fed (with hand over hand assist) Pharyngeal  Phase Impairments: Multiple swallows;Throat Clearing - Immediate;Throat Clearing - Delayed Other Comments: eructation    Nectar Thick Nectar Thick Liquid: Within functional limits Presentation: Straw;Self Fed   Honey Thick Honey Thick Liquid: Not tested   Puree Puree: Not tested   Solid     Solid: Within functional limits Presentation: Marita Snellen 09/04/2023,5:08 PM  Rolena Infante, MS [facility] SLP Acute Rehab Services Office [phone]

## 2023-09-04 NOTE — Progress Notes (Signed)
   09/04/23 2049  BiPAP/CPAP/SIPAP  Reason BIPAP/CPAP not in use Non-compliant (Unable to tolerate our CPAP/BiPAP machine-Pulled)  BiPAP/CPAP /SiPAP Vitals  SpO2 96 %  Bilateral Breath Sounds Clear;Diminished   Remains on 3 lpm humidified n/c.

## 2023-09-04 NOTE — [place] FL2 (Signed)
[place] MEDICAID FL2 LEVEL OF CARE FORM     IDENTIFICATION  Patient Name: Troy Dayal [name] Birthdate: [birth year] Sex: male Admission Date (Current Location): 09/02/2023  [facility] and [place] Number:  Producer, television/film/video and Address:  [facility],  [address] [place], [place] [address]      Provider Number: [id number]  Attending Physician Name and Address:  Osvaldo Shipper, MD  Relative Name and Phone Number:  Geraldo Pitter (Daughter)  [phone] [facility])    Current Level of Care: Hospital Recommended Level of Care: Skilled Nursing Facility Prior Approval Number:    Date Approved/Denied:   PASRR Number: [id number]  Discharge Plan: SNF    Current Diagnoses: Patient Active Problem List   Diagnosis Date Noted   Acute respiratory failure with hypoxia (HCC) 09/02/2023   Nausea & vomiting 09/02/2023   Pneumonia 09/02/2023   Compression fracture of T3 vertebra (HCC) 08/26/2023   Acute on chronic heart failure with preserved ejection fraction (HFpEF) (HCC) 08/26/2023   TMJ arthralgia 06/07/2023   COPD exacerbation (HCC) 08/25/2022   DNR (do not resuscitate) 08/24/2022   History of ERCP 04/06/2022   Anemia 04/06/2022   Upper GI bleeding 04/06/2022   Bleeding from sphincterotomy site 04/06/2022   Choledocholithiasis 04/06/2022   Vitamin B12 deficiency 02/13/2022   Gastric polyps    Duodenal diverticulum    Duodenal ulcer    Occult GI bleeding 02/12/2022   Fall at home, initial encounter 02/12/2022   Melena    Heme positive stool    History of thyrotoxicosis 01/10/2022   SOB (shortness of breath) 07/02/2021   Hypomagnesemia 07/02/2021   COPD with acute exacerbation (HCC) 07/02/2021   Hypokalemia 07/02/2021   Hyperlipidemia    COVID-19 virus infection 07/01/2021   Chronic anticoagulation 10/18/2020   Hx of adenomatous colonic polyps 10/18/2020   History of gastric polyps 10/18/2020   Iron deficiency 10/18/2020   History of anemia 10/18/2020    Gastric intestinal metaplasia 10/18/2020   Aortic atherosclerosis (HCC) 08/01/2020   Secondary hypercoagulable state (HCC) 03/22/2020   Personal history of colon cancer 01/08/2020   Epigastric pain 01/08/2020   Calculus of gallbladder without cholecystitis without obstruction 01/08/2020   Benign paroxysmal positional vertigo 12/02/2019   Presbycusis of both ears 12/02/2019   Tremor of unknown origin 12/02/2019   Vertigo 11/25/2019   Cancer of sigmoid colon (HCC) 12/12/2018   Pleural effusion on right 07/15/2018   Colon adenocarcinoma (HCC) 06/30/2018   Acute GI bleeding 06/23/2018   Acute blood loss anemia 06/23/2018   Primary open angle glaucoma (POAG) of both eyes, severe stage 06/04/2018   Pseudophakia of both eyes 06/04/2018   Depression 05/06/2018   Syncope 01/29/2018   Chronic diastolic CHF (congestive heart failure) (HCC) 05/04/2017   CAD (coronary artery disease) 05/03/2017   Screening for malignant neoplasm of respiratory organ 04/19/2017   MGUS (monoclonal gammopathy of unknown significance) 03/07/2016   Benign essential HTN 01/19/2016   OSA (obstructive sleep apnea) 08/29/2015   PAF (paroxysmal atrial fibrillation) (HCC)    Glaucoma 06/22/2015   DOE (dyspnea on exertion) 02/04/2015   Memory loss, short term 08/03/2014   Symptomatic anemia 12/28/2013   Occlusion and stenosis of carotid artery without mention of cerebral infarction 07/09/2013   COPD GOLD II  07/09/2013   Chronic renal disease, stage 3, moderately decreased glomerular filtration rate between 30-59 mL/min/1.73 square meter (HCC) 07/06/2013   TIA (transient ischemic attack) 07/06/2013   Basal cell carcinoma of skin 10/10/2012   History of lung  cancer 03/11/2012   Lung nodule 03/11/2012   Malignant neoplasm of lower lobe of right lung (HCC) 03/11/2012   PAD (peripheral artery disease) (HCC) 12/16/2011   AAA (abdominal aortic aneurysm) (HCC) 09/27/2011   Squamous cell lung cancer (HCC) 11/08/2010    Allergic rhinitis 07/28/2010   GAIT IMBALANCE 03/02/2009   Secondary DM with peripheral vascular disease (HCC) 03/02/2009   HYPERPLASIA PROSTATE UNS W/O UR OBST & OTH LUTS 01/15/2008   Peripheral vascular disease (HCC) 09/29/2007   LEUKOPLAKIA, VOCAL CORDS 09/29/2007   FEMORAL BRUIT 09/29/2007    Orientation RESPIRATION BLADDER Height & Weight     Self  O2 (2L) Incontinent Weight: 167 lb 1.7 oz (75.8 kg) Height:  6\' 1"  (185.4 cm)  BEHAVIORAL SYMPTOMS/MOOD NEUROLOGICAL BOWEL NUTRITION STATUS      Incontinent Diet (heart healthy)  AMBULATORY STATUS COMMUNICATION OF NEEDS Skin   Extensive Assist Verbally Normal                       Personal Care Assistance Level of Assistance  Bathing, Feeding, Dressing Bathing Assistance: Limited assistance   Dressing Assistance: Limited assistance     Functional Limitations Info  Sight, Hearing, Speech Sight Info: Adequate Hearing Info: Adequate Speech Info: Adequate    SPECIAL CARE FACTORS FREQUENCY  PT (By licensed PT), OT (By licensed OT)     PT Frequency: 5 x a week OT Frequency: 5 x a week            Contractures Contractures Info: Not present    Additional Factors Info  Code Status, Allergies Code Status Info: DNR Allergies Info: Niacin-lovastatin Er  Penicillins  Sulfonamide Derivatives  Lipitor (Atorvastatin)           Current Medications (09/04/2023):  This is the current hospital active medication list Current Facility-Administered Medications  Medication Dose Route Frequency Provider Last Rate Last Admin   0.45 % sodium chloride infusion   Intravenous Continuous Osvaldo Shipper, MD 75 mL/hr at 09/04/23 1224 Infusion Verify at 09/04/23 1224   acetaminophen (TYLENOL) tablet 650 mg  650 mg Oral Q6H PRN Opyd, Lavone Neri, MD       Or   acetaminophen (TYLENOL) suppository 650 mg  650 mg Rectal Q6H PRN Opyd, Lavone Neri, MD       albuterol (PROVENTIL) (2.5 MG/3ML) 0.083% nebulizer solution 2.5 mg  2.5 mg Nebulization  Q6H PRN Opyd, Lavone Neri, MD       amLODipine (NORVASC) tablet 10 mg  10 mg Oral Daily Opyd, Lavone Neri, MD   10 mg at 09/04/23 1113   apixaban (ELIQUIS) tablet 2.5 mg  2.5 mg Oral BID Opyd, Lavone Neri, MD   2.5 mg at 09/04/23 1104   azithromycin (ZITHROMAX) 500 mg in sodium chloride 0.9 % 250 mL IVPB  500 mg Intravenous Q24H Pokhrel, Laxman, MD 250 mL/hr at 09/03/23 2325 500 mg at 09/03/23 2325   brimonidine (ALPHAGAN) 0.2 % ophthalmic solution 1 drop  1 drop Both Eyes TID Opyd, Lavone Neri, MD   1 drop at 09/04/23 1107   cefTRIAXone (ROCEPHIN) 2 g in sodium chloride 0.9 % 100 mL IVPB  2 g Intravenous Q24H Pokhrel, Laxman, MD 200 mL/hr at 09/03/23 2238 2 g at 09/03/23 2238   diclofenac Sodium (VOLTAREN) 1 % topical gel 2 g  2 g Topical BID Opyd, Lavone Neri, MD   2 g at 09/04/23 1106   docusate sodium (COLACE) capsule 100 mg  100 mg Oral BID PRN Opyd, Marcial Pacas  S, MD       escitalopram (LEXAPRO) tablet 20 mg  20 mg Oral Daily Opyd, Lavone Neri, MD   20 mg at 09/04/23 1105   insulin aspart (novoLOG) injection 0-5 Units  0-5 Units Subcutaneous QHS Opyd, Lavone Neri, MD       insulin aspart (novoLOG) injection 0-6 Units  0-6 Units Subcutaneous TID WC Opyd, Lavone Neri, MD   1 Units at 09/03/23 1730   lidocaine (LIDODERM) 5 % 1 patch  1 patch Transdermal Q24H Opyd, Lavone Neri, MD   1 patch at 09/04/23 1105   melatonin tablet 20 mg  20 mg Oral QHS Opyd, Lavone Neri, MD   20 mg at 09/03/23 2216   metoprolol succinate (TOPROL-XL) 24 hr tablet 12.5 mg  12.5 mg Oral Daily Opyd, Lavone Neri, MD   12.5 mg at 09/04/23 1104   mometasone-formoterol (DULERA) 100-5 MCG/ACT inhaler 2 puff  2 puff Inhalation BID Opyd, Lavone Neri, MD   2 puff at 09/04/23 1308   And   umeclidinium bromide (INCRUSE ELLIPTA) 62.5 MCG/ACT 1 puff  1 puff Inhalation Daily Opyd, Lavone Neri, MD   1 puff at 09/04/23 0853   montelukast (SINGULAIR) tablet 10 mg  10 mg Oral QHS Opyd, Lavone Neri, MD   10 mg at 09/03/23 2217   pantoprazole (PROTONIX) EC tablet 40 mg   40 mg Oral Daily Opyd, Lavone Neri, MD   40 mg at 09/04/23 1104   potassium chloride SA (KLOR-CON M) CR tablet 40 mEq  40 mEq Oral Daily Opyd, Lavone Neri, MD   40 mEq at 09/04/23 1104   rosuvastatin (CRESTOR) tablet 40 mg  40 mg Oral Daily Opyd, Lavone Neri, MD   40 mg at 09/04/23 1104   senna-docusate (Senokot-S) tablet 1 tablet  1 tablet Oral Daily Opyd, Lavone Neri, MD   1 tablet at 09/04/23 1104     Discharge Medications: Please see discharge summary for a list of discharge medications.  Relevant Imaging Results:  Relevant Lab Results:   Additional Information [id number]  Valentina Shaggy Nicholous Girgenti, LCSW

## 2023-09-04 NOTE — Progress Notes (Signed)
   09/03/23 2230  BiPAP/CPAP/SIPAP  Reason BIPAP/CPAP not in use Non-compliant   Pt. removed CPAP from earlier placement, was unable to tolerate, decline attempt to adjust, RN aware at bedside.

## 2023-09-04 NOTE — Progress Notes (Signed)
PROGRESS NOTE    Troy Linkenhoker [name]  WUJ:[id number] DOB: 1941-07-29 DOA: 09/02/2023 PCP: Pincus Sanes, MD    Brief Narrative:  Troy Bruce [name] is a 82 y.o. 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   Family was contacted by nursing home personnel at this afternoon due to a severe episode of vomiting.  Patient denies any abdominal pain or diarrhea.  In the ED, patient was afebrile.  Was slightly tachypneic.  Vitals were stable.  Labs showed hypokalemia with potassium of 3.3 with a creatinine elevation at 1.5.  Lactate was 1.9.  Urinalysis was negative for infection.  COVID influenza and RSV was negative.  Chest x-ray showed small pleural effusion with infiltrate in the right lower lobe and perihilar infiltration.  Blood cultures were collected in the ED, patient was given 40 mg of Lasix and Levaquin and was admitted hospital for further treatment.    Assessment/Plan    Community-acquired pneumonia/acute hypoxic respiratory failure  Patient was initially started on levofloxacin and general changed over to ceftriaxone and azithromycin. Since he vomited this could have been aspiration as well. Strep pneumo urinary antigen was negative.  COVID-19, influenza and RSV PCR is negative. WBC is noted to be normal. Chest x-ray showed small right-sided pleural effusion with possible infiltrate. Patient feels better.  Still noted to be on 2 L of oxygen by nasal cannula.  Incentive spirometry.  Mobilization. Hopefully can wean him off of oxygen eventually.  Does not appear that patient is on home oxygen.  Vomiting Looks like this has  improved with antiemetics.  No further episodes reported.  Continue to monitor.  UA was negative for infection.   Essential hypertension Blood pressure seems to stable at this time. On amlodipine.  Will continue.  Acute on chronic kidney disease stage IIIb Worsening creatinine and BUN noted this morning.  Noted to be on diuretics.  Has had poor oral intake.  Will hold his diuretics for today.  Gently hydrate for a few hours.  Recheck labs in the morning.  Hyponatremia Stable.  Chronic diastolic CHF   See above regarding diuretics.  Holding for now.  Received 1 dose of IV Lasix in the emergency department on admission.     Paroxysmal atrial fibrillation. Rate controlled at this time.  Continue Eliquis and metoprolol.   COPD;  Stable.  OSA  Continue CPAP at nighttime.   PAD  Stable. Continue Eliquis and Crestor     Hypokalemia  Initial potassium of 3.3.  Supplemented.    History of lung cancer  Hx of right lower lobectomy, now under observation for recurrence involving RUL   Diabetes mellitus type 2.   Hemoglobin A1c of 6.6.  SSI  Deconditioning, debility.   Patient states that he is at assisted living facility.  PT and OT evaluation   DVT prophylaxis: apixaban (ELIQUIS) tablet 2.5 mg Start: 09/03/23 0045 apixaban (ELIQUIS) tablet 2.5 mg  Code Status:    Code Status: Limited: Do not attempt resuscitation (DNR) -DNR-LIMITED -Do Not Intubate/DNI  Family communication: No family at bedside. Disposition: Patient from nursing facility.  Will get PT OT evaluation.   Consultants:  None  Procedures:  None  Anti-infectives (From admission, onward)    Start     Dose/Rate Route Frequency Ordered Stop   09/04/23 2300  levofloxacin (LEVAQUIN) IVPB 750 mg  Status:  Discontinued        750 mg 100 mL/hr over 90 Minutes Intravenous Every 48 hours 09/02/23 2352 09/03/23 1045   09/03/23 2200  azithromycin (ZITHROMAX) 500 mg in sodium chloride 0.9 % 250 mL IVPB        500  mg 250 mL/hr over 60 Minutes Intravenous Every 24 hours 09/03/23 1045     09/03/23 2200  cefTRIAXone (ROCEPHIN) 2 g in sodium chloride 0.9 % 100 mL IVPB        2 g 200 mL/hr over 30 Minutes Intravenous Every 24 hours 09/03/23 1045     09/02/23 2215  levofloxacin (LEVAQUIN) IVPB 500 mg  Status:  Discontinued        500 mg 100 mL/hr over 60 Minutes Intravenous Every 24 hours 09/02/23 2209 09/02/23 2353        Subjective: Patient that he feels better.  Denies any chest pain shortness of breath.  No nausea.  No abdominal pain.  Objective: Vitals:   09/03/23 2028 09/03/23 2053 09/04/23 0436 09/04/23 0853  BP:  138/70 (!) 152/60   Pulse:  73    Resp:  (!) 21 20   Temp:  97.8 F (36.6 C) 97.6 F (36.4 C)   TempSrc:  Oral Oral   SpO2: 93% 95% (!) 89% 97%  Weight:      Height:        Intake/Output Summary (Last 24 hours) at 09/04/2023 1059 Last data filed at 09/04/2023 1610 Gross per 24 hour  Intake 711.18 ml  Output 1850 ml  Net -1138.82 ml   Filed Weights   09/02/23 1651 09/03/23 0337  Weight: 76.3 kg 75.8 kg    Physical Examination:  General appearance: Awake alert.  In no distress Resp: Normal effort at rest.  Diminished air entry at the right base with few crackles.  No wheezing or rhonchi. Cardio: S1-S2 is normal regular.  No S3-S4.  No rubs murmurs or bruit GI: Abdomen is soft.  Nontender nondistended.  Bowel sounds are present normal.  No masses organomegaly Extremities: No edema.  Full range of motion of lower extremities. Neurologic:  No focal neurological deficits.     Data Reviewed:   CBC: Recent Labs  Lab 09/02/23 1730 09/03/23 0426 09/04/23 0416  WBC 10.5 7.8 7.1  NEUTROABS 9.9*  --   --   HGB 15.1 13.9 12.5*  HCT 44.4 43.6 38.8*  MCV 88.8 91.0 91.9  PLT 343 277 253    Basic Metabolic Panel: Recent Labs  Lab 09/02/23 1730 09/03/23 0426 09/04/23 0416  NA 135 134* 133*  K 3.3* 3.9 3.9  CL 100 103 97*  CO2 22 23 23   GLUCOSE 145* 172*  131*  BUN 22 24* 31*  CREATININE 1.56* 1.59* 1.87*  CALCIUM 9.3 8.7* 9.1  MG 2.7*  --  2.5*    Liver Function Tests: Recent Labs  Lab 09/02/23 1730  AST 16  ALT 13  ALKPHOS 89  BILITOT 1.0  PROT 8.1  ALBUMIN 3.8     Radiology Studies: DG Chest 2 View  Result Date: 09/02/2023 CLINICAL DATA:  Suspected sepsis. New onset vomiting, weakness, and tremors. History of lung cancer not on treatment. EXAM: CHEST - 2 VIEW COMPARISON:  08/26/2023 FINDINGS: Shallow inspiration. Heart size and pulmonary vascularity are likely normal for technique. There is  infiltration or atelectasis in the right mid lung with small right pleural effusion. This appears similar to the prior study and could indicate atelectasis or pneumonia. Mild perihilar infiltration bilaterally may represent edema. No pneumothorax. Mediastinal contours appear intact. IMPRESSION: 1. Shallow inspiration. 2. Persistent small right pleural effusion with infiltration in the right lower lung, possibly infection or atelectasis. 3. Perihilar infiltration may indicate superimposed edema Electronically Signed   By: Burman Nieves M.D.   On: 09/02/2023 21:36      LOS: 2 days     Osvaldo Shipper, MD [facility] Available via Epic secure chat 7am-7pm After these hours, please refer to coverage provider listed on [email or web address] 09/04/2023, 10:59 AM

## 2023-09-04 NOTE — TOC Initial Note (Addendum)
Transition of Care (TOC) - Initial/Assessment Note    Patient Details  Name: Roswell Adornetto [name] MRN: [id number] Date of Birth: November 23, 1940  Transition of Care [facility]) CM/SW Contact:    Larrie Kass, LCSW Phone Number: 09/04/2023, 12:26 PM  Clinical Narrative:                 CSW spoke with pt's daughter Wilkie Aye to discuss d/c plan. Pt is from [facility] assisted living. CSW discussed the recommendations for SNF placement. Pt's daughter reports pt is wheelchair bound and would like to discuss recommendations with the facility and her father. Pt's daughter reports she will call this CSW back later today. TOC to follow.   Adden 2:00pm CSW received a call back for pt's daughter, she has agreed to SNF placement. She reports her preference being [facility].pt's daughter does not want pt to go to [place].  CSW explained the process, pt will need insurance authorization. CSW to fax pt out for SNF placement. TOC to follow.   Expected Discharge Plan:  (TBD) Barriers to Discharge: Continued Medical Work up   Patient Goals and CMS Choice            Expected Discharge Plan and Services       Living arrangements for the past 2 months: Assisted Living Facility                                      Prior Living Arrangements/Services Living arrangements for the past 2 months: Assisted Living Facility Lives with:: Self                   Activities of Daily Living   ADL Screening (condition at time of admission) Independently performs ADLs?: No Does the patient have a NEW difficulty with bathing/dressing/toileting/self-feeding that is expected to last >3 days?: No Does the patient have a NEW difficulty with getting in/out of bed, walking, or climbing stairs that is expected to last >3 days?: No Does the patient have a NEW difficulty with communication that is expected to last >3 days?: No Is the patient deaf or have difficulty hearing?: Yes Does  the patient have difficulty seeing, even when wearing glasses/contacts?: Yes Does the patient have difficulty concentrating, remembering, or making decisions?: No  Permission Sought/Granted                  Emotional Assessment              Admission diagnosis:  Pleural effusion [J90] Hypoxia [R09.02] Acute respiratory failure with hypoxia (HCC) [J96.01] Patient Active Problem List   Diagnosis Date Noted   Acute respiratory failure with hypoxia (HCC) 09/02/2023   Nausea & vomiting 09/02/2023   Pneumonia 09/02/2023   Compression fracture of T3 vertebra (HCC) 08/26/2023   Acute on chronic heart failure with preserved ejection fraction (HFpEF) (HCC) 08/26/2023   TMJ arthralgia 06/07/2023   COPD exacerbation (HCC) 08/25/2022   DNR (do not resuscitate) 08/24/2022   History of ERCP 04/06/2022   Anemia 04/06/2022   Upper GI bleeding 04/06/2022   Bleeding from sphincterotomy site 04/06/2022   Choledocholithiasis 04/06/2022   Vitamin B12 deficiency 02/13/2022   Gastric polyps    Duodenal diverticulum    Duodenal ulcer    Occult GI bleeding 02/12/2022   Fall at home, initial encounter 02/12/2022   Melena    Heme positive stool    History of  thyrotoxicosis 01/10/2022   SOB (shortness of breath) 07/02/2021   Hypomagnesemia 07/02/2021   COPD with acute exacerbation (HCC) 07/02/2021   Hypokalemia 07/02/2021   Hyperlipidemia    COVID-19 virus infection 07/01/2021   Chronic anticoagulation 10/18/2020   Hx of adenomatous colonic polyps 10/18/2020   History of gastric polyps 10/18/2020   Iron deficiency 10/18/2020   History of anemia 10/18/2020   Gastric intestinal metaplasia 10/18/2020   Aortic atherosclerosis (HCC) 08/01/2020   Secondary hypercoagulable state (HCC) 03/22/2020   Personal history of colon cancer 01/08/2020   Epigastric pain 01/08/2020   Calculus of gallbladder without cholecystitis without obstruction 01/08/2020   Benign paroxysmal positional vertigo  12/02/2019   Presbycusis of both ears 12/02/2019   Tremor of unknown origin 12/02/2019   Vertigo 11/25/2019   Cancer of sigmoid colon (HCC) 12/12/2018   Pleural effusion on right 07/15/2018   Colon adenocarcinoma (HCC) 06/30/2018   Acute GI bleeding 06/23/2018   Acute blood loss anemia 06/23/2018   Primary open angle glaucoma (POAG) of both eyes, severe stage 06/04/2018   Pseudophakia of both eyes 06/04/2018   Depression 05/06/2018   Syncope 01/29/2018   Chronic diastolic CHF (congestive heart failure) (HCC) 05/04/2017   CAD (coronary artery disease) 05/03/2017   Screening for malignant neoplasm of respiratory organ 04/19/2017   MGUS (monoclonal gammopathy of unknown significance) 03/07/2016   Benign essential HTN 01/19/2016   OSA (obstructive sleep apnea) 08/29/2015   PAF (paroxysmal atrial fibrillation) (HCC)    Glaucoma 06/22/2015   DOE (dyspnea on exertion) 02/04/2015   Memory loss, short term 08/03/2014   Symptomatic anemia 12/28/2013   Occlusion and stenosis of carotid artery without mention of cerebral infarction 07/09/2013   COPD GOLD II  07/09/2013   Chronic renal disease, stage 3, moderately decreased glomerular filtration rate between 30-59 mL/min/1.73 square meter (HCC) 07/06/2013   TIA (transient ischemic attack) 07/06/2013   Basal cell carcinoma of skin 10/10/2012   History of lung cancer 03/11/2012   Lung nodule 03/11/2012   Malignant neoplasm of lower lobe of right lung (HCC) 03/11/2012   PAD (peripheral artery disease) (HCC) 12/16/2011   AAA (abdominal aortic aneurysm) (HCC) 09/27/2011   Squamous cell lung cancer (HCC) 11/08/2010   Allergic rhinitis 07/28/2010   GAIT IMBALANCE 03/02/2009   Secondary DM with peripheral vascular disease (HCC) 03/02/2009   HYPERPLASIA PROSTATE UNS W/O UR OBST & OTH LUTS 01/15/2008   Peripheral vascular disease (HCC) 09/29/2007   LEUKOPLAKIA, VOCAL CORDS 09/29/2007   FEMORAL BRUIT 09/29/2007   PCP:  Pincus Sanes,  MD Pharmacy:   [facility] - [place], [place] - [name] [name] [address] Phone: [phone] Fax: [phone]     Social Determinants of Health (SDOH) Social History: SDOH Screenings   Food Insecurity: No Food Insecurity (09/03/2023)  Housing: Low Risk  (09/03/2023)  Transportation Needs: No Transportation Needs (09/03/2023)  Utilities: Not At Risk (09/03/2023)  Alcohol Screen: Low Risk  (10/05/2022)  Depression (PHQ2-9): Low Risk  (04/08/2023)  Financial Resource Strain: Low Risk  (10/05/2022)  Physical Activity: Inactive (10/05/2022)  Social Connections: Socially Isolated (10/05/2022)  Stress: No Stress Concern Present (10/05/2022)  Tobacco Use: Medium Risk (09/02/2023)   SDOH Interventions:     Readmission Risk Interventions    08/29/2022   11:58 AM 07/04/2021    3:08 PM 07/04/2021    1:14 PM  Readmission Risk Prevention Plan  Transportation Screening Complete Complete Complete  PCP or Specialist Appt within 3-5 Days Complete  HRI or Home Care Consult Complete    Social Work Consult for Recovery Care Planning/Counseling Complete    Palliative Care Screening Not Applicable    Medication Review (RN Care Manager) Complete Referral to Pharmacy Referral to Pharmacy  PCP or Specialist appointment within 3-5 days of discharge  Complete Complete  HRI or Home Care Consult  Complete Complete  SW Recovery Care/Counseling Consult  Complete   Palliative Care Screening  Not Applicable   Skilled Nursing Facility  Patient Refused

## 2023-09-05 DIAGNOSIS — J90 Pleural effusion, not elsewhere classified: Secondary | ICD-10-CM | POA: Diagnosis not present

## 2023-09-05 DIAGNOSIS — J189 Pneumonia, unspecified organism: Secondary | ICD-10-CM | POA: Diagnosis not present

## 2023-09-05 DIAGNOSIS — J9601 Acute respiratory failure with hypoxia: Secondary | ICD-10-CM | POA: Diagnosis not present

## 2023-09-05 LAB — BASIC METABOLIC PANEL
Anion gap: 11 (ref 5–15)
BUN: 32 mg/dL — ABNORMAL HIGH (ref 8–23)
CO2: 19 mmol/L — ABNORMAL LOW (ref 22–32)
Calcium: 9 mg/dL (ref 8.9–10.3)
Chloride: 105 mmol/L (ref 98–111)
Creatinine, Ser: 1.54 mg/dL — ABNORMAL HIGH (ref 0.61–1.24)
GFR, Estimated: 45 mL/min — ABNORMAL LOW (ref >60)
Glucose, Bld: 123 mg/dL — ABNORMAL HIGH (ref 70–99)
Potassium: 3.9 mmol/L (ref 3.5–5.1)
Sodium: 135 mmol/L (ref 135–145)

## 2023-09-05 LAB — GLUCOSE, CAPILLARY
Glucose-Capillary: 102 mg/dL — ABNORMAL HIGH (ref 70–99)
Glucose-Capillary: 166 mg/dL — ABNORMAL HIGH (ref 70–99)
Glucose-Capillary: 105 mg/dL — ABNORMAL HIGH (ref 70–99)
Glucose-Capillary: 120 mg/dL — ABNORMAL HIGH (ref 70–99)

## 2023-09-05 LAB — CBC
HCT: 33.5 % — ABNORMAL LOW (ref 39.0–52.0)
Hemoglobin: 11 g/dL — ABNORMAL LOW (ref 13.0–17.0)
MCH: 30.1 pg (ref 26.0–34.0)
MCHC: 32.8 g/dL (ref 30.0–36.0)
MCV: 91.5 fL (ref 80.0–100.0)
Platelets: 213 10*3/uL (ref 150–400)
RBC: 3.66 MIL/uL — ABNORMAL LOW (ref 4.22–5.81)
RDW: 15.9 % — ABNORMAL HIGH (ref 11.5–15.5)
WBC: 6.3 10*3/uL (ref 4.0–10.5)
nRBC: 0 % (ref 0.0–0.2)

## 2023-09-05 MED ORDER — CEFDINIR 300 MG PO CAPS
300.0000 mg | ORAL_CAPSULE | Freq: Two times a day (BID) | ORAL | Status: DC
  Administered 2023-09-05 – 2023-09-07 (×4): 300 mg via ORAL
  Filled 2023-09-05 (×4): qty 1

## 2023-09-05 MED ORDER — AZITHROMYCIN 250 MG PO TABS
500.0000 mg | ORAL_TABLET | Freq: Every day | ORAL | Status: DC
  Administered 2023-09-05 – 2023-09-06 (×2): 500 mg via ORAL
  Filled 2023-09-05 (×2): qty 2

## 2023-09-05 NOTE — Progress Notes (Signed)
Physical Therapy Treatment Patient Details Name: Troy Kassin [name] MRN: [id number] DOB: 06-Nov-1940 Today's Date: 09/05/2023   History of Present Illness Patient is a 82 year old male who presented form LTC with vomiting, SOB and weakness. Patient was admitted with pneumonia, acute hypoxic respiratory failure, chronic diastolic CHF, PMH: lung cancer, CKD III, COPD, PAF, hypokalemia, OSA, h/o R lower lobectomy    PT Comments  Pt asleep upon arrival, agreeable to therapy. Pt needing increased cues and repeating cues with mobility, inconsistently following 1 step commands. Pt needing max A+2 for bed mobility to come to sitting EOB, strong posterior lean in sitting. Therapist assisted pt's hands to prop self in sitting, still needing min A to CGA for safety due to slowly reclining back into posterior lean. Pt powers to stand with mod A+2, again posterior lean and BLE braced against front of bed. Pt able to perform 3 STS reps and stand for ~30 sec increments. Pt noted to be soiled and fatigued with standing, assisted pt back to supine and rolled R/L to assist in pericare and linen change. Pt on 3L O2 with SpO2 89-94%- RN notified. Patient will benefit from continued inpatient follow up therapy, <3 hours/day.  Pt's caregiver present during session; reports pt used manual w/c in the apartment, needed physical assist with transfers, was ambulating and performing exercises with PT using RW and gait belt up until ~2 weeks ago when pt got sick.    If plan is discharge home, recommend the following: Two people to help with walking and/or transfers;Assistance with cooking/housework;Assist for transportation;Help with stairs or ramp for entrance;Two people to help with bathing/dressing/bathroom   Can travel by private vehicle     No  Equipment Recommendations  None recommended by PT    Recommendations for Other Services       Precautions / Restrictions Precautions Precautions: Fall Precaution  Comments: R eye no vision, L eye low vision Restrictions Weight Bearing Restrictions: No     Mobility  Bed Mobility Overal bed mobility: Needs Assistance Bed Mobility: Supine to Sit, Sit to Supine, Rolling Rolling: Mod assist   Supine to sit: Max assist, +2 for physical assistance Sit to supine: Max assist, +2 for physical assistance   General bed mobility comments: max A+2 for supine<>sit for trunk and BLE management, heavy multimodal cues due to low vision; mod A for rolling R/L in bed for linen change and pericare    Transfers Overall transfer level: Needs assistance Equipment used: Rolling walker (2 wheels) Transfers: Sit to/from Stand Sit to Stand: Mod assist, +2 physical assistance, From elevated surface           General transfer comment: mod A+2 to power up from elevated bed, pt pulling from braced RW due to low vision, verbal cues for weight shift forward but pt maintains strong posterior lean with BLE braced against bed    Ambulation/Gait                   Stairs             Wheelchair Mobility     Tilt Bed    Modified Rankin (Stroke Patients Only)       Balance Overall balance assessment: Needs assistance Sitting-balance support: Feet supported, Bilateral upper extremity supported Sitting balance-Leahy Scale: Poor Sitting balance - Comments: minA to CGA, requires UE support Postural control: Posterior lean Standing balance support: Bilateral upper extremity supported, Reliant on assistive device for balance Standing balance-Leahy Scale: Poor Standing balance  comment: posterior lean in stance with BLE braced against bed                            Cognition Arousal: Lethargic Behavior During Therapy: Flat affect Overall Cognitive Status: Impaired/Different from baseline                                 General Comments: pt pleasant, needing increased cues, following 1 step commands inconsistently, caregiver  present offering cues/encouragement as needed        Exercises      General Comments General comments (skin integrity, edema, etc.): Pt on 3L O2 with SpO2 89-94%      Pertinent Vitals/Pain      Home Living                          Prior Function            PT Goals (current goals can now be found in the care plan section) Progress towards PT goals: Progressing toward goals    Frequency    Min 1X/week      PT Plan      Co-evaluation              AM-PAC PT "6 Clicks" Mobility   Outcome Measure  Help needed turning from your back to your side while in a flat bed without using bedrails?: A Lot Help needed moving from lying on your back to sitting on the side of a flat bed without using bedrails?: A Lot Help needed moving to and from a bed to a chair (including a wheelchair)?: Total Help needed standing up from a chair using your arms (e.g., wheelchair or bedside chair)?: Total Help needed to walk in hospital room?: Total Help needed climbing 3-5 steps with a railing? : Total 6 Click Score: 8    End of Session Equipment Utilized During Treatment: Gait belt Activity Tolerance: Patient tolerated treatment well;Patient limited by fatigue Patient left: in bed;with call bell/phone within reach;with bed alarm set;with family/visitor present Nurse Communication: Mobility status PT Visit Diagnosis: Unsteadiness on feet (R26.81);Muscle weakness (generalized) (M62.81);Difficulty in walking, not elsewhere classified (R26.2)     Time: 2831-5176 PT Time Calculation (min) (ACUTE ONLY): 33 min  Charges:    $Therapeutic Activity: 23-37 mins PT General Charges $$ ACUTE PT VISIT: 1 Visit                     Tori Karee Christopherson PT, DPT 09/05/23, 2:47 PM

## 2023-09-05 NOTE — Progress Notes (Signed)
Hand off report from Lennie Odor., RN. This RN has assessed and agrees with the morning shift assessment documented.

## 2023-09-05 NOTE — TOC Progression Note (Signed)
Transition of Care (TOC) - Progression Note    Patient Details  Name: Troy Mcfarling [name] MRN: [id number] Date of Birth: 1941/09/29  Transition of [facility]) CM/SW Contact  Larrie Kass, LCSW Phone Number: 09/05/2023, 11:34 AM  Clinical Narrative:     CSW presented bed offers to pt's daughter she has chosen [facility]. CSW has contacted [employer] to start insurance authorization. TOC to follow.     Expected Discharge Plan:  (TBD) Barriers to Discharge: Continued Medical Work up  Expected Discharge Plan and Services       Living arrangements for the past 2 months: Assisted Living Facility                                       Social Determinants of Health (SDOH) Interventions SDOH Screenings   Food Insecurity: No Food Insecurity (09/03/2023)  Housing: Low Risk  (09/03/2023)  Transportation Needs: No Transportation Needs (09/03/2023)  Utilities: Not At Risk (09/03/2023)  Alcohol Screen: Low Risk  (10/05/2022)  Depression (PHQ2-9): Low Risk  (04/08/2023)  Financial Resource Strain: Low Risk  (10/05/2022)  Physical Activity: Inactive (10/05/2022)  Social Connections: Socially Isolated (10/05/2022)  Stress: No Stress Concern Present (10/05/2022)  Tobacco Use: Medium Risk (09/02/2023)    Readmission Risk Interventions    08/29/2022   11:58 AM 07/04/2021    3:08 PM 07/04/2021    1:14 PM  Readmission Risk Prevention Plan  Transportation Screening Complete Complete Complete  PCP or Specialist Appt within 3-5 Days Complete    HRI or Home Care Consult Complete    Social Work Consult for Recovery Care Planning/Counseling Complete    Palliative Care Screening Not Applicable    Medication Review Oceanographer) Complete Referral to Pharmacy Referral to Pharmacy  PCP or Specialist appointment within 3-5 days of discharge  Complete Complete  HRI or Home Care Consult  Complete Complete  SW Recovery Care/Counseling Consult  Complete   Palliative Care Screening  Not  Applicable   Skilled Nursing Facility  Patient Refused

## 2023-09-05 NOTE — Progress Notes (Signed)
PROGRESS NOTE    Troy Cluney [name]  IEP:[id number] DOB: 02-13-1941 DOA: 09/02/2023 PCP: Pincus Sanes, MD    Brief Narrative:  Troy Bruce [name] is a 82 y.o. 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   Family was contacted by nursing home personnel at this afternoon due to a severe episode of vomiting.  Patient denies any abdominal pain or diarrhea.  In the ED, patient was afebrile.  Was slightly tachypneic.  Vitals were stable.  Labs showed hypokalemia with potassium of 3.3 with a creatinine elevation at 1.5.  Lactate was 1.9.  Urinalysis was negative for infection.  COVID influenza and RSV was negative.  Chest x-ray showed small pleural effusion with infiltrate in the right lower lobe and perihilar infiltration.  Blood cultures were collected in the ED, patient was given 40 mg of Lasix and Levaquin and was admitted hospital for further treatment.    Assessment/Plan    Community-acquired pneumonia/acute hypoxic respiratory failure  Patient was initially started on levofloxacin and general changed over to ceftriaxone and azithromycin. Since he vomited this could have been aspiration as well. Strep pneumo urinary antigen was negative.  COVID-19, influenza and RSV PCR is negative. WBC is noted to be normal. Chest x-ray showed small right-sided pleural effusion with possible infiltrate. Patient continues to feel better.  Remains on 2 L of oxygen by nasal cannula.  Continue with incentive spirometry and mobilization.  Seen by speech therapy.  Modified barium swallow today. Changed to oral antibiotics today.  Vomiting Looks like  this has improved with antiemetics.  No further episodes reported.  Continue to monitor.  UA was negative for infection.   Oral candidiasis Nystatin.  Seems to be better this morning.  Essential hypertension Blood pressure seems to stable at this time. On amlodipine.  Will continue.  Acute on chronic kidney disease stage IIIb Worsening creatinine and BUN noted yesterday morning.  Likely due to poor oral intake and diuretics.  Diuretics were discontinued.  He was gently hydrated.  Creatinine is better this morning.  Continue to hold his diuretics for another 24 hours.  Recheck labs tomorrow.    Hyponatremia Stable.  Chronic diastolic CHF   See above regarding diuretics.  Holding for now.   Received 1 dose of IV Lasix in the emergency department on admission.     Paroxysmal atrial fibrillation. Rate controlled at this time.  Continue Eliquis and metoprolol.   COPD;  Stable.  OSA  Continue CPAP at nighttime.   PAD  Stable. Continue Eliquis and Crestor     Hypokalemia  Supplemented.    History of lung cancer  Hx of right lower lobectomy, now under observation for recurrence involving RUL   Diabetes mellitus type 2.   Hemoglobin A1c of 6.6.  SSI  Deconditioning, debility.   Patient states that he is at assisted living facility.  PT and OT evaluation   DVT prophylaxis: Apixaban Code Status:    Code Status: Limited: Do not attempt resuscitation (DNR) -DNR-LIMITED -Do Not Intubate/DNI  Family communication: Daughter was updated yesterday Disposition: Patient from ALF.  SNF is recommended.  Anticipate discharge in 24 hours.   Consultants:  None  Procedures:  None   Anti-infectives (From admission, onward)    Start     Dose/Rate Route Frequency Ordered Stop   09/04/23 2300  levofloxacin (LEVAQUIN) IVPB 750 mg  Status:  Discontinued        750 mg 100 mL/hr over 90 Minutes Intravenous Every 48 hours 09/02/23 2352 09/03/23 1045   09/03/23 2200  azithromycin  (ZITHROMAX) 500 mg in sodium chloride 0.9 % 250 mL IVPB        500 mg 250 mL/hr over 60 Minutes Intravenous Every 24 hours 09/03/23 1045     09/03/23 2200  cefTRIAXone (ROCEPHIN) 2 g in sodium chloride 0.9 % 100 mL IVPB        2 g 200 mL/hr over 30 Minutes Intravenous Every 24 hours 09/03/23 1045     09/02/23 2215  levofloxacin (LEVAQUIN) IVPB 500 mg  Status:  Discontinued        500 mg 100 mL/hr over 60 Minutes Intravenous Every 24 hours 09/02/23 2209 09/02/23 2353        Subjective: Patient states that he is feeling better.  Denies any shortness of breath or chest pain this morning.  No nausea vomiting.  Objective: Vitals:   09/04/23 2049 09/05/23 0500 09/05/23 0551 09/05/23 0839  BP:   (!) 145/70   Pulse:   72   Resp:   15   Temp:   97.6 F (36.4 C)   TempSrc:   Oral   SpO2: 96%  97% 95%  Weight:  75.1 kg    Height:        Intake/Output Summary (Last 24 hours) at 09/05/2023 0853 Last data filed at 09/05/2023 0829 Gross per 24 hour  Intake 885.1 ml  Output 2600 ml  Net -1714.9 ml   Filed Weights   09/02/23 1651 09/03/23 0337 09/05/23 0500  Weight: 76.3 kg 75.8 kg 75.1 kg    Physical Examination:  General appearance: Awake alert.  In no distress Resp: Normal effort at rest.  Coarse breath sounds with few crackles at the bases.  No wheezing or rhonchi. Cardio: S1-S2 is normal regular.  No S3-S4.  No rubs murmurs or bruit GI: Abdomen is soft.  Nontender nondistended.  Bowel sounds are present normal.  No masses organomegaly    Data Reviewed:   CBC: Recent Labs  Lab 09/02/23 1730 09/03/23 0426 09/04/23 0416 09/05/23 0413  WBC 10.5 7.8 7.1 6.3  NEUTROABS 9.9*  --   --   --   HGB 15.1 13.9 12.5* 11.0*  HCT 44.4 43.6 38.8* 33.5*  MCV 88.8 91.0 91.9 91.5  PLT 343 277 253 213    Basic Metabolic Panel: Recent Labs  Lab 09/02/23 1730 09/03/23 0426 09/04/23 0416 09/05/23 0413  NA 135 134* 133* 135  K 3.3* 3.9 3.9 3.9  CL 100 103 97* 105  CO2 22 23 23   19*  GLUCOSE 145* 172* 131* 123*  BUN 22 24* 31* 32*  CREATININE 1.56* 1.59* 1.87* 1.54*  CALCIUM 9.3 8.7* 9.1 9.0  MG 2.7*  --  2.5*  --     Liver Function Tests: Recent Labs  Lab 09/02/23 1730  AST 16  ALT 13  ALKPHOS 89  BILITOT 1.0  PROT 8.1  ALBUMIN 3.8     Radiology Studies: No results found.    LOS: 3 days     Osvaldo Shipper, MD [facility] Available via Epic secure chat 7am-7pm After these hours, please refer to coverage provider listed on [email or web address] 09/05/2023, 8:53 AM

## 2023-09-05 NOTE — Progress Notes (Signed)
   09/05/23 2022  BiPAP/CPAP/SIPAP  BiPAP/CPAP/SIPAP Pt Type Adult  Reason BIPAP/CPAP not in use Non-compliant   Pt can not tolerate.

## 2023-09-05 NOTE — Plan of Care (Signed)
  Problem: Coping: Goal: Ability to adjust to condition or change in health will improve Outcome: Progressing   Problem: Fluid Volume: Goal: Ability to maintain a balanced intake and output will improve Outcome: Progressing

## 2023-09-06 ENCOUNTER — Inpatient Hospital Stay (HOSPITAL_COMMUNITY): Payer: PPO

## 2023-09-06 DIAGNOSIS — J189 Pneumonia, unspecified organism: Secondary | ICD-10-CM | POA: Diagnosis not present

## 2023-09-06 LAB — GLUCOSE, CAPILLARY
Glucose-Capillary: 95 mg/dL (ref 70–99)
Glucose-Capillary: 166 mg/dL — ABNORMAL HIGH (ref 70–99)
Glucose-Capillary: 111 mg/dL — ABNORMAL HIGH (ref 70–99)
Glucose-Capillary: 218 mg/dL — ABNORMAL HIGH (ref 70–99)

## 2023-09-06 LAB — BASIC METABOLIC PANEL
Anion gap: 11 (ref 5–15)
BUN: 29 mg/dL — ABNORMAL HIGH (ref 8–23)
CO2: 23 mmol/L (ref 22–32)
Calcium: 9 mg/dL (ref 8.9–10.3)
Chloride: 99 mmol/L (ref 98–111)
Creatinine, Ser: 1.47 mg/dL — ABNORMAL HIGH (ref 0.61–1.24)
GFR, Estimated: 47 mL/min — ABNORMAL LOW (ref >60)
Glucose, Bld: 111 mg/dL — ABNORMAL HIGH (ref 70–99)
Potassium: 4 mmol/L (ref 3.5–5.1)
Sodium: 133 mmol/L — ABNORMAL LOW (ref 135–145)

## 2023-09-06 MED ORDER — CEFDINIR 300 MG PO CAPS: 300.0000 mg | ORAL_CAPSULE | Freq: Two times a day (BID) | ORAL | Status: AC

## 2023-09-06 MED ORDER — FUROSEMIDE 20 MG PO TABS: 40.0000 mg | ORAL_TABLET | Freq: Every day | ORAL | Status: DC

## 2023-09-06 MED ORDER — NYSTATIN 100000 UNIT/ML MT SUSP: 5.0000 mL | Freq: Four times a day (QID) | OROMUCOSAL | Status: AC

## 2023-09-06 MED ORDER — AZITHROMYCIN 500 MG PO TABS: 500.0000 mg | ORAL_TABLET | Freq: Every day | ORAL | Status: AC

## 2023-09-06 NOTE — Progress Notes (Signed)
OT Cancellation Note  Patient Details Name: Troy Gialanella [name] MRN: [id number] DOB: Mar 05, 1941   Cancelled Treatment:    Reason Eval/Treat Not Completed: Fatigue/lethargy limiting ability to participate.   Evern Bio 09/06/2023, 12:58 PM Berna Spare, OTR/L Acute Rehabilitation Services Office: [phone]

## 2023-09-06 NOTE — Discharge Summary (Addendum)
[facility]  Physician Discharge Summary   Patient ID: Troy Lucchese [name] MRN: [id number] DOB/AGE: [birth year] 82 y.o.  Admit date: 09/02/2023 Discharge date:   09/06/2023   PCP: Pincus Sanes, MD  DISCHARGE DIAGNOSES:    Pneumonia   Colon adenocarcinoma (HCC)   Squamous cell lung cancer (HCC)   PAD (peripheral artery disease) (HCC)   Chronic renal disease, stage 3, moderately decreased glomerular filtration rate between 30-59 mL/min/1.73 square meter (HCC)   COPD GOLD II    PAF (paroxysmal atrial fibrillation) (HCC)   OSA (obstructive sleep apnea)   Benign essential HTN   Chronic diastolic CHF (congestive heart failure) (HCC)   Pleural effusion on right   Hypokalemia   Acute respiratory failure with hypoxia (HCC)   RECOMMENDATIONS FOR OUTPATIENT FOLLOW UP: Please check CBC and basic metabolic panel in 3 to 4 days Please consult palliative care for goals of care conversation.   Home Health: Going to SNF Equipment/Devices: None   CODE STATUS: DNR  DISCHARGE CONDITION: fair  Diet recommendation: Heart healthy  INITIAL HISTORY: 82 y.o. 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   Family was contacted by nursing home personnel at this afternoon due to a severe episode of vomiting.  Patient denies any abdominal pain or diarrhea.  In the ED, patient was afebrile.  Was slightly tachypneic.  Vitals were stable.  Labs showed hypokalemia with potassium of 3.3 with a creatinine elevation at 1.5.  Lactate was 1.9.  Urinalysis was negative for infection.  COVID influenza and RSV was negative.  Chest x-ray showed  small pleural effusion with infiltrate in the right lower lobe and perihilar infiltration.  Blood cultures were collected in the ED, patient was given 40 mg of Lasix and Levaquin and was admitted hospital for further treatment.     HOSPITAL COURSE:   Community-acquired pneumonia/acute hypoxic respiratory failure  Patient was initially started on levofloxacin and then changed over to ceftriaxone and azithromycin. Since he vomited prior to admission this could have been aspiration as well. Strep pneumo urinary antigen was negative.  COVID-19, influenza and RSV PCR is negative. WBC is noted to be normal. Chest x-ray showed small right-sided pleural effusion with possible infiltrate. Patient continues to improve.  Remains on 2 to 3 L of oxygen via nasal cannula.  Should be able to wean down while he is at the skilled nursing facility. Seen by speech therapy.  Modified barium swallow could not be done yesterday.  Hopefully will be done today prior to discharge. Changed to oral antibiotics today.   Vomiting Looks like this has improved with antiemetics.  No further episodes reported.  Continue to monitor.  UA was negative for infection.    Oral candidiasis Nystatin.  Seems to be improving.   Essential hypertension Continue home meds   Acute on chronic kidney disease stage IIIb Worsening creatinine and BUN noted a few days ago.  His diuretics were held since he appeared to be hypovolemic.  He was given gentle hydration with improvement in renal function.  Diuretics can be resumed at SNF at a lower dose.  Recheck renal function panel in 3 to 4 days.  Hyponatremia Stable.   Chronic diastolic CHF   See above regarding diuretics.     Paroxysmal atrial fibrillation. Rate controlled at this time.  Continue Eliquis and metoprolol.   COPD;  Stable.   OSA  Continue CPAP at nighttime.   PAD  Stable. Continue Eliquis and Crestor     Hypokalemia  On daily supplements.    History of  lung cancer  Hx of right lower lobectomy, now under observation for recurrence involving RUL    Diabetes mellitus type 2.   Hemoglobin A1c of 6.6.     Deconditioning, debility.   Patient was not assisted living facility.  Seen by PT.  Noted to be deconditioned.  Would benefit from short-term rehab at [facility].   Patient is stable.  Okay for discharge to SNF when bed is available.   PERTINENT LABS:  The results of significant diagnostics from this hospitalization (including imaging, microbiology, ancillary and laboratory) are listed below for reference.    Microbiology: Recent Results (from the past 240 hour(s))  Culture, blood (Routine x 2)     Status: None (Preliminary result)   Collection Time: 09/02/23  5:25 PM   Specimen: BLOOD  Result Value Ref Range Status   Specimen Description   Final    BLOOD RIGHT ANTECUBITAL Performed at [facility], [address] [place], [place] [address]    Special Requests   Final    BOTTLES DRAWN AEROBIC AND ANAEROBIC Blood Culture adequate volume Performed at [facility] [facility], [address] [place], [place] [address]    Culture   Final    NO GROWTH 4 DAYS Performed at [facility], [address], [place], [place] [address]    Report Status PENDING  Incomplete  Culture, blood (Routine x 2)     Status: None (Preliminary result)   Collection Time: 09/02/23  5:30 PM   Specimen: BLOOD LEFT FOREARM  Result Value Ref Range Status   Specimen Description   Final    BLOOD LEFT FOREARM Performed at [facility], [address], [place], [place] [address]    Special Requests   Final    BOTTLES DRAWN AEROBIC AND ANAEROBIC Blood Culture results may not be optimal due to an inadequate volume of blood received in culture bottles Performed at [facility], [address], [place], [place] [address]    Culture   Final    NO GROWTH 4 DAYS Performed at [facility], [address] [place], [place] [address]    Report Status PENDING  Incomplete  Resp panel by RT-PCR (RSV, Flu A&B, Covid) Anterior Nasal Swab     Status: None   Collection Time: 09/03/23  1:02 AM   Specimen: Anterior Nasal Swab  Result Value Ref Range Status   SARS Coronavirus 2 by RT PCR NEGATIVE NEGATIVE Final    Comment: (NOTE) SARS-CoV-2 target nucleic acids are NOT DETECTED.  The SARS-CoV-2 RNA is generally detectable in upper respiratory specimens during the acute phase of infection. The lowest concentration of SARS-CoV-2 viral copies this assay can detect is 138 copies/mL. A negative result does not preclude SARS-Cov-2 infection and should not be used as the sole basis for treatment or other patient management decisions. A negative result may occur with  improper specimen collection/handling, submission of specimen other than nasopharyngeal swab, presence of viral mutation(s) within the areas targeted by this assay, and inadequate number of viral copies(<138 copies/mL). A negative result must  be combined with clinical observations, patient history, and epidemiological information. The expected result is Negative.  Fact Sheet for Patients:  BloggerCourse.com  Fact Sheet for Healthcare Providers:  SeriousBroker.it  This test is no t yet approved or cleared by the [place] FDA and  has been authorized for detection and/or diagnosis of SARS-CoV-2 by FDA under an Emergency Use Authorization (EUA). This EUA will remain  in effect (meaning this test can be used) for the duration of the COVID-19 declaration under Section 564(b)(1) of the Act, 21 U.S.C.section 360bbb-3(b)(1), unless the authorization is terminated  or revoked sooner.       Influenza A by PCR NEGATIVE NEGATIVE Final   Influenza B by PCR NEGATIVE NEGATIVE Final    Comment: (NOTE) The Xpert Xpress SARS-CoV-2/FLU/RSV plus assay is intended as an aid in the diagnosis of  influenza from Nasopharyngeal swab specimens and should not be used as a sole basis for treatment. Nasal washings and aspirates are unacceptable for Xpert Xpress SARS-CoV-2/FLU/RSV testing.  Fact Sheet for Patients: BloggerCourse.com  Fact Sheet for Healthcare Providers: SeriousBroker.it  This test is not yet approved or cleared by the [place] FDA and has been authorized for detection and/or diagnosis of SARS-CoV-2 by FDA under an Emergency Use Authorization (EUA). This EUA will remain in effect (meaning this test can be used) for the duration of the COVID-19 declaration under Section 564(b)(1) of the Act, 21 U.S.C. section 360bbb-3(b)(1), unless the authorization is terminated or revoked.     Resp Syncytial Virus by PCR NEGATIVE NEGATIVE Final    Comment: (NOTE) Fact Sheet for Patients: BloggerCourse.com  Fact Sheet for Healthcare Providers: SeriousBroker.it  This test is not yet approved or cleared by the [place] FDA and has been authorized for detection and/or diagnosis of SARS-CoV-2 by FDA under an Emergency Use Authorization (EUA). This EUA will remain in effect (meaning this test can be used) for the duration of the COVID-19 declaration under Section 564(b)(1) of the Act, 21 U.S.C. section 360bbb-3(b)(1), unless the authorization is terminated or revoked.  Performed at [facility], [address], [place], [place] [address]      Labs:   Basic Metabolic Panel: Recent Labs  Lab 09/02/23 1730 09/03/23 0426 09/04/23 0416 09/05/23 0413 09/06/23 0409  NA 135 134* 133* 135 133*  K 3.3* 3.9 3.9 3.9 4.0  CL 100 103 97* 105 99  CO2 22 23 23  19* 23  GLUCOSE 145* 172* 131* 123* 111*  BUN 22 24* 31* 32* 29*  CREATININE 1.56* 1.59* 1.87* 1.54* 1.47*  CALCIUM 9.3 8.7* 9.1 9.0 9.0  MG 2.7*  --  2.5*  --   --    Liver Function  Tests: Recent Labs  Lab 09/02/23 1730  AST 16  ALT 13  ALKPHOS 89  BILITOT 1.0  PROT 8.1  ALBUMIN 3.8    CBC: Recent Labs  Lab 09/02/23 1730 09/03/23 0426 09/04/23 0416 09/05/23 0413  WBC 10.5 7.8 7.1 6.3  NEUTROABS 9.9*  --   --   --   HGB 15.1 13.9 12.5* 11.0*  HCT 44.4 43.6 38.8* 33.5*  MCV 88.8 91.0 91.9 91.5  PLT 343 277 253 213      CBG: Recent Labs  Lab 09/05/23 0734 09/05/23 1221 09/05/23 1630 09/05/23 2106 09/06/23 0740  GLUCAP 102* 166* 105* 120* 95     IMAGING STUDIES DG Chest 2 View  Result Date: 09/02/2023 CLINICAL DATA:  Suspected sepsis. New onset vomiting, weakness, and tremors. History of  lung cancer not on treatment. EXAM: CHEST - 2 VIEW COMPARISON:  08/26/2023 FINDINGS: Shallow inspiration. Heart size and pulmonary vascularity are likely normal for technique. There is infiltration or atelectasis in the right mid lung with small right pleural effusion. This appears similar to the prior study and could indicate atelectasis or pneumonia. Mild perihilar infiltration bilaterally may represent edema. No pneumothorax. Mediastinal contours appear intact. IMPRESSION: 1. Shallow inspiration. 2. Persistent small right pleural effusion with infiltration in the right lower lung, possibly infection or atelectasis. 3. Perihilar infiltration may indicate superimposed edema Electronically Signed   By: Burman Nieves M.D.   On: 09/02/2023 21:36   DG Pelvis Portable  Result Date: 08/26/2023 CLINICAL DATA:  Status post fall at home with back pain EXAM: PORTABLE PELVIS 1 VIEWS COMPARISON:  Pelvis radiograph dated 12/07/2022 FINDINGS: There is no evidence of pelvic fracture or diastasis. No pelvic bone lesions are seen. Vascular calcifications. Surgical clips project over the left inguinal region. Surgical sutures project over the left hemipelvis. IMPRESSION: No radiographic finding of acute displaced fracture. Electronically Signed   By: Agustin Cree M.D.   On:  08/26/2023 10:21   CT CERVICAL SPINE WO CONTRAST  Result Date: 08/26/2023 CLINICAL DATA:  Polytrauma, blunt EXAM: CT CERVICAL SPINE WITHOUT CONTRAST TECHNIQUE: Multidetector CT imaging of the cervical spine was performed without intravenous contrast. Multiplanar CT image reconstructions were also generated. RADIATION DOSE REDUCTION: This exam was performed according to the departmental dose-optimization program which includes automated exposure control, adjustment of the mA and/or kV according to patient size and/or use of iterative reconstruction technique. COMPARISON:  12/07/2022 FINDINGS: Alignment: Facet joints are aligned without dislocation or traumatic listhesis. Dens and lateral masses are aligned. Skull base and vertebrae: Acute mild superior endplate compression fracture of the T3 vertebral body with 15-20% vertebral body height loss. No retropulsion. Vertebral bodies of the cervical spine are intact without fracture. No focal bone lesion. Soft tissues and spinal canal: No prevertebral fluid or swelling. No visible canal hematoma. Disc levels: Similar degree of advanced multilevel cervical spondylosis. Upper chest: 2.0 x 1.6 cm site of extrapleural nodularity at the apex of the right hemithorax (series 5, image 112), not appreciably changed compared to the recent prior CT. Other: None. IMPRESSION: 1. Acute mild superior endplate compression fracture of the T3 vertebral body. 2. No acute fracture or traumatic listhesis of the cervical spine. 3. 2.0 x 1.6 cm site of extrapleural nodularity at the apex of the right hemithorax, not appreciably changed compared to the recent prior CT. Electronically Signed   By: Duanne Guess D.O.   On: 08/26/2023 10:20   CT HEAD WO CONTRAST  Result Date: 08/26/2023 CLINICAL DATA:  Head trauma, moderate-severe EXAM: CT HEAD WITHOUT CONTRAST TECHNIQUE: Contiguous axial images were obtained from the base of the skull through the vertex without intravenous contrast.  RADIATION DOSE REDUCTION: This exam was performed according to the departmental dose-optimization program which includes automated exposure control, adjustment of the mA and/or kV according to patient size and/or use of iterative reconstruction technique. COMPARISON:  12/07/2022 FINDINGS: Brain: No evidence of acute infarction, hemorrhage, hydrocephalus, extra-axial collection or mass lesion/mass effect. Patchy low-density changes within the periventricular and subcortical white matter most compatible with chronic microvascular ischemic change. Mild-moderate diffuse cerebral volume loss. Vascular: Atherosclerotic calcifications involving the large vessels of the skull base. No unexpected hyperdense vessel. Skull: Normal. Negative for fracture or focal lesion. Sinuses/Orbits: No acute finding. Other: None. IMPRESSION: 1. No acute intracranial findings. 2. Chronic microvascular ischemic  change and cerebral volume loss. Electronically Signed   By: Duanne Guess D.O.   On: 08/26/2023 10:12   DG Chest Port 1 View  Result Date: 08/26/2023 CLINICAL DATA:  Trauma EXAM: PORTABLE CHEST 1 VIEW COMPARISON:  07/31/2023 FINDINGS: Left chest wall loop recorder. Normal heart size. Volume loss in the right lung. Similar chronic right-sided pleural effusion. Increasing bilateral perihilar and bibasilar opacities. No left-sided pleural effusion. No pneumothorax. IMPRESSION: 1. Increasing bilateral perihilar and bibasilar opacities, which may represent edema or infection. 2. Similar chronic right-sided pleural effusion. Electronically Signed   By: Duanne Guess D.O.   On: 08/26/2023 10:11   CT Chest Wo Contrast  Result Date: 08/24/2023 CLINICAL DATA:  History of colon and lung cancer, follow-up right lung mass EXAM: CT CHEST WITHOUT CONTRAST TECHNIQUE: Multidetector CT imaging of the chest was performed following the standard protocol without IV contrast. RADIATION DOSE REDUCTION: This exam was performed according to  the departmental dose-optimization program which includes automated exposure control, adjustment of the mA and/or kV according to patient size and/or use of iterative reconstruction technique. COMPARISON:  PET-CT dated 03/25/2023 FINDINGS: Cardiovascular: The heart is normal in size. Rightward cardiomediastinal shift. No pericardial effusion. No evidence of thoracic aortic aneurysm. Atherosclerotic calcifications of the aortic arch. Moderate three-vessel coronary atherosclerosis. Mediastinum/Nodes: Small mediastinal lymph nodes, including a 10 mm short axis AP window node (series 2/image 50), previously 9 mm. 17 mm short axis superior right paratracheal node (series 2/image 22), previously 14 mm. Visualized thyroid is unremarkable. Lungs/Pleura: Status post right lower lobectomy. 17 x 16 mm right perifissural nodule along the minor fissure (series 4/image 94), previously 16 x 14 mm, mildly progressive, raising the possibility of primary bronchogenic carcinoma versus metastasis. Additional band like opacity in the posterior right upper lobe along the minor fissure (series 4/image 80), similar versus mildly improved, favoring post treatment changes on PET. Moderate centrilobular and paraseptal emphysematous changes, upper lung predominant. No suspicious pulmonary nodules. Small right pleural effusion, partially loculated with chronic pleural thickening, unchanged. No pneumothorax. Upper Abdomen: Visualized upper abdomen is grossly unremarkable, noting vascular calcifications. Musculoskeletal: Degenerative changes of the visualized thoracolumbar spine. IMPRESSION: Status post right lower lobectomy. 17 x 16 mm right perifissural nodule along the minor fissure, mildly progressive, raising the possibility of primary bronchogenic carcinoma versus metastasis. Additional band like opacity in the posterior right upper lobe along the minor fissure, similar versus mildly improved, favoring post treatment changes on PET. Small  mediastinal lymph nodes, mildly progressive, suspicious for nodal metastases. Aortic Atherosclerosis (ICD10-I70.0) and Emphysema (ICD10-J43.9). Electronically Signed   By: Charline Bills M.D.   On: 08/24/2023 00:27    DISCHARGE EXAMINATION: Vitals:   09/05/23 2103 09/06/23 0500 09/06/23 0536 09/06/23 0821  BP: (!) 153/62  134/68   Pulse: 74  (!) 59   Resp: 18  18   Temp: 98.7 F (37.1 C)  98.1 F (36.7 C)   TempSrc: Oral  Oral   SpO2: 94%  100% 100%  Weight:  73.2 kg    Height:       General appearance: Awake alert.  In no distress Resp: Improved aeration bilaterally with few crackles at the bases. Cardio: S1-S2 is normal regular.  No S3-S4.  No rubs murmurs or bruit GI: Abdomen is soft.  Nontender nondistended.  Bowel sounds are present normal.  No masses organomegaly    DISPOSITION: SNF  Discharge Instructions     Call MD for:  difficulty breathing, headache or visual disturbances   Complete  by: As directed    Call MD for:  extreme fatigue   Complete by: As directed    Call MD for:  persistant dizziness or light-headedness   Complete by: As directed    Call MD for:  persistant nausea and vomiting   Complete by: As directed    Call MD for:  severe uncontrolled pain   Complete by: As directed    Call MD for:  temperature >100.4   Complete by: As directed    Diet - low sodium heart healthy   Complete by: As directed    Discharge instructions   Complete by: As directed    Please review discharge summary for instructions.  You were cared for by a hospitalist during your hospital stay. If you have any questions about your discharge medications or the care you received while you were in the hospital after you are discharged, you can call the unit and asked to speak with the hospitalist on call if the hospitalist that took care of you is not available. Once you are discharged, your primary care physician will handle any further medical issues. Please note that NO REFILLS for  any discharge medications will be authorized once you are discharged, as it is imperative that you return to your primary care physician (or establish a relationship with a primary care physician if you do not have one) for your aftercare needs so that they can reassess your need for medications and monitor your lab values. If you do not have a primary care physician, you can call [phone] for a physician referral.   Increase activity slowly   Complete by: As directed          Allergies as of 09/06/2023       Reactions   Niacin-lovastatin Er Shortness Of Breath, Other (See Comments)   Dyspnea Flushing *Not listed on MAR    Penicillins Hives, Shortness Of Breath, Other (See Comments)   Dyspnea Flushing Because of a history of documented adverse serious drug reaction;Medi Alert bracelet  is recommended PCN reaction causing immediate rash, facial/tongue/throat swelling, SOB or lightheadedness with hypotension: Yes PCN reaction causing severe rash involving mucus membranes or skin necrosis: No PCN reaction occurring within the last 10 years: NO PCN reaction that required hospitalization: NO   Sulfonamide Derivatives Hives, Shortness Of Breath, Other (See Comments)   Dyspnea  Flushing Because of a history of documented adverse serious drug reaction;Medi Alert bracelet  is recommended   Lipitor [atorvastatin] Other (See Comments)   Myalgias Arthralgias  *Not listed on MAR         Medication List     TAKE these medications    acetaminophen 325 MG tablet Commonly known as: TYLENOL Take 650 mg by mouth every 6 (six) hours as needed for moderate pain or headache.   acetaminophen 500 MG tablet Commonly known as: TYLENOL Take 1 tablet (500 mg total) by mouth every 6 (six) hours as needed.   albuterol 108 (90 Base) MCG/ACT inhaler Commonly known as: VENTOLIN HFA Inhale 2 puffs into the lungs every 6 (six) hours as needed for wheezing or shortness of breath.   amLODipine 10 MG  tablet Commonly known as: NORVASC Take 10 mg by mouth daily.   apixaban 2.5 MG Tabs tablet Commonly known as: Eliquis Take 1 tablet (2.5 mg total) by mouth 2 (two) times daily. Okay to restart this medication on 03/27/2023   azithromycin 500 MG tablet Commonly known as: ZITHROMAX Take 1 tablet (500 mg total)  by mouth daily for 2 days.   Breztri Aerosphere 160-9-4.8 MCG/ACT Aero Generic drug: Budeson-Glycopyrrol-Formoterol Inhale 2 puffs into the lungs in the morning and at bedtime.   cefdinir 300 MG capsule Commonly known as: OMNICEF Take 1 capsule (300 mg total) by mouth every 12 (twelve) hours for 4 days.   diclofenac Sodium 1 % Gel Commonly known as: Voltaren Apply 2 g topically 4 (four) times daily. What changed: when to take this   docusate sodium 100 MG capsule Commonly known as: COLACE Take 100 mg by mouth 2 (two) times daily as needed for moderate constipation.   escitalopram 20 MG tablet Commonly known as: Lexapro Take 1 tablet (20 mg total) by mouth daily.   furosemide 20 MG tablet Commonly known as: LASIX Take 2 tablets (40 mg total) by mouth daily. What changed: how much to take   Jardiance 10 MG Tabs tablet Generic drug: empagliflozin TAKE ONE TABLET BY MOUTH EVERY DAY BEFORE BREAKFAST.   latanoprost 0.005 % ophthalmic solution Commonly known as: XALATAN Place 1 drop into both eyes at bedtime.   lidocaine 5 % Commonly known as: Lidoderm Place 1 patch onto the skin daily. Remove & Discard patch within 12 hours or as directed by MD   loperamide 2 MG tablet Commonly known as: IMODIUM A-D Take 2 mg by mouth 2 (two) times daily as needed for diarrhea or loose stools.   Melatonin 10 MG Tabs Take 20 mg by mouth at bedtime.   metoprolol succinate 25 MG 24 hr tablet Commonly known as: TOPROL-XL Take 0.5 tablets (12.5 mg total) by mouth daily.   montelukast 10 MG tablet Commonly known as: SINGULAIR TAKE ONE TABLET BY MOUTH AT BEDTIME What changed:  when to take this   nystatin 100000 UNIT/ML suspension Commonly known as: MYCOSTATIN Use as directed 5 mLs (500,000 Units total) in the mouth or throat 4 (four) times daily for 5 days.   omeprazole 20 MG capsule Commonly known as: PRILOSEC Take 20 mg by mouth daily.   potassium chloride SA 20 MEQ tablet Commonly known as: KLOR-CON M TAKE TWO TABLETS BY MOUTH IN THE MORNING AND TAKE ONE TABLET IN THE EVENING What changed: See the new instructions.   PRESCRIPTION MEDICATION Pt uses CPAP machine at bedtime   rosuvastatin 40 MG tablet Commonly known as: CRESTOR TAKE ONE TABLET BY MOUTH EVERY DAY   senna-docusate 8.6-50 MG tablet Commonly known as: Senokot-S Take 1 tablet by mouth daily.   Simbrinza 1-0.2 % Susp Generic drug: Brinzolamide-Brimonidine Place 1 drop into both eyes 3 (three) times daily. 8a, 2p, 8p   spironolactone 25 MG tablet Commonly known as: ALDACTONE Take 25 mg by mouth daily.   Systane Ultra 0.4-0.3 % Soln Generic drug: Polyethyl Glycol-Propyl Glycol Place 1 drop into both eyes daily as needed (dry eyes).          Contact information for follow-up providers     Pincus Sanes, MD. Schedule an appointment as soon as possible for a visit in 1 week(s).   Specialty: Internal Medicine Why: post hospitalization follow up Contact information: [address] [place] [address] [phone]              Contact information for after-discharge care     Destination     [facility] Preferred SNF .   Service: Skilled Nursing Contact information: [address] [place] [address] [phone]  TOTAL DISCHARGE TIME: 35 minutes  Suhaylah [place] [employer] on [email or web address]  09/06/2023, 9:25 AM

## 2023-09-06 NOTE — Plan of Care (Signed)
  Problem: Skin Integrity: Goal: Risk for impaired skin integrity will decrease Outcome: Progressing   Problem: Safety: Goal: Ability to remain free from injury will improve Outcome: Progressing   Problem: Skin Integrity: Goal: Risk for impaired skin integrity will decrease Outcome: Progressing

## 2023-09-06 NOTE — Procedures (Signed)
Modified Barium Swallow Study  Patient Details  Name: Troy Fluharty [name] MRN: [id number] Date of Birth: Jan 25, 1941  Today's Date: 09/06/2023  Modified Barium Swallow completed.  Full report located under Chart Review in the Imaging Section.  History of Present Illness 82 year old male who presented form LTC with vomiting, SOB and weakness. Patient was admitted with pneumonia, acute hypoxic respiratory failure, chronic diastolic CHF, PMH: lung cancer, CKD III, COPD, PAF, hypokalemia, OSA, h/o R lower lobectomy, DNR. Shallow inspiration. 2. Persistent small right pleural effusion with infiltration in the right lower lung, possibly infection or atelectasis. 3. Perihilar infiltration may indicate superimposed edema. 08/26/2023 Chronic microvascular ischemic change and cerebral volume loss. 17 x 16 mm right perifissural nodule along the minor fissure, mildly progressive, raising the possibility of primary bronchogenic carcinoma versus metastasis.  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    Swallow evaluation ordered.  Pt's caregiver of several years, "Troy [name]" present and admits to pt having coughing with liquids more than solids over the last several weeks.   Clinical Impression Patient presents with mild oral and moderate pharyngeal dysphagia with sensorimotor deficits resulting in both audible and silent aspiration of thin.   Oral manipulation was mildly impaired due to tongue weakness.  Impaired tongue base retraction, and hyolaryngeal elevator muscle contraction noted resulting in impaired airway closure allowing aspiration of barium mixed with secretions. Pt did not sense min aspirates but cough noted with larger amount.  Aspirates were not clear with cued or reflexive coughing. Chin tuck posture did not prevent aspiration of thin liquids - but slight chin elevation was protective for nectar and tsps of thin. Pt has more difficulty with  pharyngeal clearance of liquids than solids and dry swallows were helpful to decrease stasis.  Did not test barium tablet as pt reported he was tired from swallowing. A-P view note conducted as MBS chair was broken and IR chair does not allot for A-P view. Recommend regular/nectar diet and tsps of thin at any time with Bascom Levels water protocol implementation.  Using teach back, educated pt to strengthen his "hock" - forced expectoration and cough for airway protection. Follow up at next venue of care indicated please. Factors that may increase risk of adverse event in presence of aspiration [employer]oots 2021): Poor general health and/or compromised immunity;Frail or deconditioned;Weak cough  Swallow Evaluation Recommendations Supervision: Patient able to self-feed Swallowing strategies  : Slow rate;Small bites/sips;effortful swallow (intermittent hard cough, if reflexively coughing - cough strongly to clear) Postural changes: Stay upright 30-60 min after meals;Position pt fully upright for meals Oral care recommendations: Oral care BID (2x/day)     Troy Infante, MS [facility] SLP Acute Rehab Services Office [phone]  Chales Abrahams 09/06/2023,9:31 AM

## 2023-09-06 NOTE — Progress Notes (Signed)
   09/06/23 1200  SLP Visit Information  SLP Received On 09/06/23  General Information  Behavior/Cognition Lethargic/Drowsy  Patient Positioning  (lying flat, pt had just finished eating some breakfat and had undergone MBS earlier today)  Oral care provided N/A  HPI 82 year old male who presented form LTC with vomiting, SOB and weakness. Patient was admitted with pneumonia, acute hypoxic respiratory failure, chronic diastolic CHF, PMH: lung cancer, CKD III, COPD, PAF, hypokalemia, OSA, h/o R lower lobectomy, DNR. Shallow inspiration. 2. Persistent small right pleural effusion with infiltration in the right lower lung, possibly infection or atelectasis. 3. Perihilar infiltration may indicate superimposed edema. 08/26/2023 Chronic microvascular ischemic change and cerebral volume loss. 17 x 16 mm right perifissural nodule along the minor fissure, mildly progressive, raising the possibility of primary bronchogenic carcinoma versus metastasis. 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 Swallow evaluation ordered. Pt's caregiver of several years  -- "T" present and admits to pt having coughing with liquids more than solids over the last several weeks.  Treatment Provided  Treatment provided Purpose of session was to educate pt's caregiver "T" and pt's daughter to findings of MBS and - recommendations.  SLP reviewed flouroscopy loops with daughter and caregiver and explained clinical reasoning for compensations.   Discussed risk/benefit of potential care plans including dehydration risks if pt will not consume nectar liquids.  Discussed Bascom Levels water protocol with daughter and provided swallow compensation strategies.  Encouraged them to have pt strengthen cough and strong expectoration.     Daughter inquired as to why pt is having dysphagia - suspect his deconditioning has large impact.  Dysphagia  Dysphagia Treatment  Temperature Spikes Noted N/A   Oral Cavity - Dentition Missing dentition;Other (Comment);Adequate natural dentition  Respiratory Status Supplemental O2 delivered via (comment)  Patient observed directly with PO's No  Type of cueing Visual;Verbal  Family/Caregiver Educated caregiver "T" in room and daughter via FaceTime  Treatment Methods Patient/caregiver education  Reason PO's not observed Lethargic  Pain Assessment  Pain Assessment No/denies pain  SLP - End of Session  Patient left in bed;with call bell/phone within reach;with bed alarm set;with family/visitor present  Nurse Communication Aspiration precautions reviewed;Swallow strategies reviewed;Diet recommendation;Other (comment)  Assessment / Recommendations / Plan  Plan Other (Comment) (follow up at next venue of care)  Dysphagia Recommendations  Diet recommendations Regular;Nectar-thick liquid (tsps of thin)  Liquids provided via Teaspoon;Cup;Straw  Medication Administration Whole meds with puree  Supervision Full supervision/cueing for compensatory strategies  Postural Changes and/or Swallow Maneuvers Seated upright 90 degrees;Upright 30-60 min after meal  General Recommendations  Oral Care Recommendations Oral care prior to ice chip/H20;Oral care BID  Follow Up Recommendations Skilled nursing-short term rehab (<3 hours/day)  Assistance recommended at discharge Frequent or constant Supervision/Assistance  SLP Visit Diagnosis Dysphagia, oropharyngeal phase (R13.12)  Progression Toward Goals  Progression toward goals Progressing toward goals  Patient/Family Stated Goal  (inferred that daughter wants pt to get better)  SLP Time Calculation  SLP Start Time (ACUTE ONLY) 1101  SLP Stop Time (ACUTE ONLY) 1115  SLP Time Calculation (min) (ACUTE ONLY) 14 min  SLP Evaluations  $ SLP Speech Visit 1 Visit  SLP Evaluations  $Swallowing Treatment 1 Procedure   Rolena Infante, MS [facility] SLP Acute Rehab Services Office [phone]

## 2023-09-06 NOTE — TOC Progression Note (Addendum)
Transition of Care (TOC) - Progression Note    Patient Details  Name: Troy Corson [name] MRN: [id number] Date of Birth: 1941/07/25  Transition of Care [facility]) CM/SW Contact  Larrie Kass, LCSW Phone Number: 09/06/2023, 11:00 AM  Clinical Narrative:     [facility] requesting a peer to peer with their Medical Director, Lovena Le [phone], will need to be completed by 12pm on 12/7. MD made aware. TOC to follow.   Expected Discharge Plan:  (TBD) Barriers to Discharge: Continued Medical Work up  Expected Discharge Plan and Services       Living arrangements for the past 2 months: Assisted Living Facility                                       Social Determinants of Health (SDOH) Interventions SDOH Screenings   Food Insecurity: No Food Insecurity (09/03/2023)  Housing: Low Risk  (09/03/2023)  Transportation Needs: No Transportation Needs (09/03/2023)  Utilities: Not At Risk (09/03/2023)  Alcohol Screen: Low Risk  (10/05/2022)  Depression (PHQ2-9): Low Risk  (04/08/2023)  Financial Resource Strain: Low Risk  (10/05/2022)  Physical Activity: Inactive (10/05/2022)  Social Connections: Socially Isolated (10/05/2022)  Stress: No Stress Concern Present (10/05/2022)  Tobacco Use: Medium Risk (09/02/2023)    Readmission Risk Interventions    08/29/2022   11:58 AM 07/04/2021    3:08 PM 07/04/2021    1:14 PM  Readmission Risk Prevention Plan  Transportation Screening Complete Complete Complete  PCP or Specialist Appt within 3-5 Days Complete    HRI or Home Care Consult Complete    Social Work Consult for Recovery Care Planning/Counseling Complete    Palliative Care Screening Not Applicable    Medication Review Oceanographer) Complete Referral to Pharmacy Referral to Pharmacy  PCP or Specialist appointment within 3-5 days of discharge  Complete Complete  HRI or Home Care Consult  Complete Complete  SW Recovery Care/Counseling Consult  Complete   Palliative Care  Screening  Not Applicable   Skilled Nursing Facility  Patient Refused

## 2023-09-07 DIAGNOSIS — M6281 Muscle weakness (generalized): Secondary | ICD-10-CM | POA: Diagnosis not present

## 2023-09-07 DIAGNOSIS — D631 Anemia in chronic kidney disease: Secondary | ICD-10-CM | POA: Diagnosis not present

## 2023-09-07 DIAGNOSIS — R42 Dizziness and giddiness: Secondary | ICD-10-CM | POA: Diagnosis not present

## 2023-09-07 DIAGNOSIS — J9601 Acute respiratory failure with hypoxia: Secondary | ICD-10-CM | POA: Diagnosis not present

## 2023-09-07 DIAGNOSIS — J90 Pleural effusion, not elsewhere classified: Secondary | ICD-10-CM | POA: Diagnosis not present

## 2023-09-07 DIAGNOSIS — I129 Hypertensive chronic kidney disease with stage 1 through stage 4 chronic kidney disease, or unspecified chronic kidney disease: Secondary | ICD-10-CM | POA: Diagnosis not present

## 2023-09-07 DIAGNOSIS — C3431 Malignant neoplasm of lower lobe, right bronchus or lung: Secondary | ICD-10-CM | POA: Diagnosis not present

## 2023-09-07 DIAGNOSIS — R0902 Hypoxemia: Secondary | ICD-10-CM | POA: Diagnosis not present

## 2023-09-07 DIAGNOSIS — C189 Malignant neoplasm of colon, unspecified: Secondary | ICD-10-CM | POA: Diagnosis not present

## 2023-09-07 DIAGNOSIS — E119 Type 2 diabetes mellitus without complications: Secondary | ICD-10-CM | POA: Diagnosis not present

## 2023-09-07 DIAGNOSIS — M6259 Muscle wasting and atrophy, not elsewhere classified, multiple sites: Secondary | ICD-10-CM | POA: Diagnosis not present

## 2023-09-07 DIAGNOSIS — E876 Hypokalemia: Secondary | ICD-10-CM | POA: Diagnosis not present

## 2023-09-07 DIAGNOSIS — I5033 Acute on chronic diastolic (congestive) heart failure: Secondary | ICD-10-CM | POA: Diagnosis not present

## 2023-09-07 DIAGNOSIS — R41841 Cognitive communication deficit: Secondary | ICD-10-CM | POA: Diagnosis not present

## 2023-09-07 DIAGNOSIS — Z515 Encounter for palliative care: Secondary | ICD-10-CM | POA: Diagnosis not present

## 2023-09-07 DIAGNOSIS — I739 Peripheral vascular disease, unspecified: Secondary | ICD-10-CM | POA: Diagnosis not present

## 2023-09-07 DIAGNOSIS — N1832 Chronic kidney disease, stage 3b: Secondary | ICD-10-CM | POA: Diagnosis not present

## 2023-09-07 DIAGNOSIS — R4182 Altered mental status, unspecified: Secondary | ICD-10-CM | POA: Diagnosis not present

## 2023-09-07 DIAGNOSIS — K219 Gastro-esophageal reflux disease without esophagitis: Secondary | ICD-10-CM | POA: Diagnosis not present

## 2023-09-07 DIAGNOSIS — R112 Nausea with vomiting, unspecified: Secondary | ICD-10-CM | POA: Diagnosis not present

## 2023-09-07 DIAGNOSIS — H409 Unspecified glaucoma: Secondary | ICD-10-CM | POA: Diagnosis not present

## 2023-09-07 DIAGNOSIS — R278 Other lack of coordination: Secondary | ICD-10-CM | POA: Diagnosis not present

## 2023-09-07 DIAGNOSIS — J189 Pneumonia, unspecified organism: Secondary | ICD-10-CM | POA: Diagnosis not present

## 2023-09-07 DIAGNOSIS — R488 Other symbolic dysfunctions: Secondary | ICD-10-CM | POA: Diagnosis not present

## 2023-09-07 DIAGNOSIS — I4891 Unspecified atrial fibrillation: Secondary | ICD-10-CM | POA: Diagnosis not present

## 2023-09-07 DIAGNOSIS — I509 Heart failure, unspecified: Secondary | ICD-10-CM | POA: Diagnosis not present

## 2023-09-07 DIAGNOSIS — Z7401 Bed confinement status: Secondary | ICD-10-CM | POA: Diagnosis not present

## 2023-09-07 DIAGNOSIS — J449 Chronic obstructive pulmonary disease, unspecified: Secondary | ICD-10-CM | POA: Diagnosis not present

## 2023-09-07 DIAGNOSIS — J309 Allergic rhinitis, unspecified: Secondary | ICD-10-CM | POA: Diagnosis not present

## 2023-09-07 DIAGNOSIS — C4491 Basal cell carcinoma of skin, unspecified: Secondary | ICD-10-CM | POA: Diagnosis not present

## 2023-09-07 DIAGNOSIS — R1312 Dysphagia, oropharyngeal phase: Secondary | ICD-10-CM | POA: Diagnosis not present

## 2023-09-07 DIAGNOSIS — R531 Weakness: Secondary | ICD-10-CM | POA: Diagnosis not present

## 2023-09-07 DIAGNOSIS — G4733 Obstructive sleep apnea (adult) (pediatric): Secondary | ICD-10-CM | POA: Diagnosis not present

## 2023-09-07 DIAGNOSIS — I10 Essential (primary) hypertension: Secondary | ICD-10-CM | POA: Diagnosis not present

## 2023-09-07 DIAGNOSIS — R2689 Other abnormalities of gait and mobility: Secondary | ICD-10-CM | POA: Diagnosis not present

## 2023-09-07 DIAGNOSIS — H8110 Benign paroxysmal vertigo, unspecified ear: Secondary | ICD-10-CM | POA: Diagnosis not present

## 2023-09-07 DIAGNOSIS — R413 Other amnesia: Secondary | ICD-10-CM | POA: Diagnosis not present

## 2023-09-07 DIAGNOSIS — R2681 Unsteadiness on feet: Secondary | ICD-10-CM | POA: Diagnosis not present

## 2023-09-07 DIAGNOSIS — I5032 Chronic diastolic (congestive) heart failure: Secondary | ICD-10-CM | POA: Diagnosis not present

## 2023-09-07 DIAGNOSIS — I480 Paroxysmal atrial fibrillation: Secondary | ICD-10-CM | POA: Diagnosis not present

## 2023-09-07 LAB — CULTURE, BLOOD (ROUTINE X 2)
Culture: NO GROWTH
Culture: NO GROWTH
Special Requests: ADEQUATE

## 2023-09-07 LAB — GLUCOSE, CAPILLARY
Glucose-Capillary: 116 mg/dL — ABNORMAL HIGH (ref 70–99)
Glucose-Capillary: 134 mg/dL — ABNORMAL HIGH (ref 70–99)

## 2023-09-07 MED ORDER — FUROSEMIDE 40 MG PO TABS
40.0000 mg | ORAL_TABLET | Freq: Every day | ORAL | Status: DC
  Administered 2023-09-07: 40 mg via ORAL
  Filled 2023-09-07: qty 1

## 2023-09-07 NOTE — Progress Notes (Signed)
[facility] Liaison note:       Notified by [facility] manager of patient/family request for [facility] services at [facility] after discharge.              Hospital liaison will follow patient for discharge disposition.       Please call with any hospice or outpatient palliative care related questions.         Thank you for the opportunity to participate in this patient's care. Thea Gist, BSN, [employer]  [phone]

## 2023-09-07 NOTE — Plan of Care (Signed)
  Problem: Nutritional: Goal: Maintenance of adequate nutrition will improve Outcome: Progressing   Problem: Skin Integrity: Goal: Risk for impaired skin integrity will decrease Outcome: Progressing   Problem: Safety: Goal: Ability to remain free from injury will improve Outcome: Progressing

## 2023-09-07 NOTE — Discharge Summary (Addendum)
[facility]  Physician Discharge Summary   Patient ID: Troy Perteet [name] MRN: [id number] DOB/AGE: [birth year] 82 y.o.  Admit date: 09/02/2023 Discharge date:   09/07/2023   PCP: Pincus Sanes, MD  DISCHARGE DIAGNOSES:    Pneumonia   Colon adenocarcinoma (HCC)   Squamous cell lung cancer (HCC)   PAD (peripheral artery disease) (HCC)   Chronic renal disease, stage 3, moderately decreased glomerular filtration rate between 30-59 mL/min/1.73 square meter (HCC)   COPD GOLD II    PAF (paroxysmal atrial fibrillation) (HCC)   OSA (obstructive sleep apnea)   Benign essential HTN   Chronic diastolic CHF (congestive heart failure) (HCC)   Pleural effusion on right   Hypokalemia   Acute respiratory failure with hypoxia (HCC)   RECOMMENDATIONS FOR OUTPATIENT FOLLOW UP: Please check CBC and basic metabolic panel in 3 to 4 days Please consult palliative care for goals of care conversation.   Home Health: Going to SNF Equipment/Devices: None   CODE STATUS: DNR  DISCHARGE CONDITION: fair  Diet recommendation: Heart healthy  INITIAL HISTORY: 82 y.o. 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   Family was contacted by nursing home personnel at this afternoon due to a severe episode of vomiting.  Patient denies any abdominal pain or diarrhea.  In the ED, patient was afebrile.  Was slightly tachypneic.  Vitals were stable.  Labs showed hypokalemia with potassium of 3.3 with a creatinine elevation at 1.5.  Lactate was 1.9.  Urinalysis was negative for infection.  COVID influenza and RSV was negative.  Chest x-ray showed  small pleural effusion with infiltrate in the right lower lobe and perihilar infiltration.  Blood cultures were collected in the ED, patient was given 40 mg of Lasix and Levaquin and was admitted hospital for further treatment.     HOSPITAL COURSE:   Community-acquired pneumonia/acute hypoxic respiratory failure  Patient was initially started on levofloxacin and then changed over to ceftriaxone and azithromycin. Since he vomited prior to admission this could have been aspiration as well. Strep pneumo urinary antigen was negative.  COVID-19, influenza and RSV PCR is negative. WBC is noted to be normal. Chest x-ray showed small right-sided pleural effusion with possible infiltrate. Patient continues to improve.  Remains on 2 to 3 L of oxygen via nasal cannula.  Should be able to wean down while he is at the skilled nursing facility. He was changed over to oral antibiotics.  Currently on cefdinir and azithromycin.  Seen by speech therapy.   Vomiting Looks like this has improved with antiemetics.  No further episodes reported.  Continue to monitor.  UA was negative for infection.    Oral candidiasis Nystatin.  Seems to be improving.   Essential hypertension Continue home meds   Acute on chronic kidney disease stage IIIb Worsening creatinine and BUN noted a few days ago.  His diuretics were held since he appeared to be hypovolemic.  He was given gentle hydration with improvement in renal function.   Diuretics can be resumed at a lower dose.  Will recheck his labs tomorrow if he is still here.   Hyponatremia Stable.   Chronic diastolic CHF   See above  regarding diuretics.     Paroxysmal atrial fibrillation. Rate controlled at this time.  Continue Eliquis and metoprolol.   COPD;  Stable.   OSA  Continue CPAP at nighttime.   PAD  Stable. Continue Eliquis and Crestor     Hypokalemia  On daily supplements.    History of lung cancer  Hx of right lower lobectomy, now under  observation for recurrence involving RUL    Diabetes mellitus type 2.   Hemoglobin A1c of 6.6.     Deconditioning, debility.   Patient was not assisted living facility.  Seen by PT.  Noted to be deconditioned.  Would benefit from short-term rehab at [facility].   Patient is stable.  Okay for discharge to SNF when bed is available.    PERTINENT LABS:  The results of significant diagnostics from this hospitalization (including imaging, microbiology, ancillary and laboratory) are listed below for reference.    Microbiology: Recent Results (from the past 240 hour(s))  Culture, blood (Routine x 2)     Status: None   Collection Time: 09/02/23  5:25 PM   Specimen: BLOOD  Result Value Ref Range Status   Specimen Description   Final    BLOOD RIGHT ANTECUBITAL Performed at [facility], [address] [place], [place] [address]    Special Requests   Final    BOTTLES DRAWN AEROBIC AND ANAEROBIC Blood Culture adequate volume Performed at [facility], [address] [place], [place] [address]    Culture   Final    NO GROWTH 5 DAYS Performed at [name], [address], [place], [place] [address]    Report Status 09/07/2023 FINAL  Final  Culture, blood (Routine x 2)     Status: None   Collection Time: 09/02/23  5:30 PM   Specimen: BLOOD LEFT FOREARM  Result Value Ref Range Status   Specimen Description   Final    BLOOD LEFT FOREARM Performed at [facility], [address], [place], [place] [address]    Special Requests   Final    BOTTLES DRAWN AEROBIC AND ANAEROBIC Blood Culture results may not be optimal due to an inadequate volume of blood received in culture bottles Performed at [facility], [address] [place], [place] [address]    Culture   Final    NO GROWTH 5 DAYS Performed at [facility], [address], [place], [place] [address]    Report Status 09/07/2023 FINAL  Final  Resp panel by  RT-PCR (RSV, Flu A&B, Covid) Anterior Nasal Swab     Status: None   Collection Time: 09/03/23  1:02 AM   Specimen: Anterior Nasal Swab  Result Value Ref Range Status   SARS Coronavirus 2 by RT PCR NEGATIVE NEGATIVE Final    Comment: (NOTE) SARS-CoV-2 target nucleic acids are NOT DETECTED.  The SARS-CoV-2 RNA is generally detectable in upper respiratory specimens during the acute phase of infection. The lowest concentration of SARS-CoV-2 viral copies this assay can detect is 138 copies/mL. A negative result does not preclude SARS-Cov-2 infection and should not be used as the sole basis for treatment or other patient management decisions. A negative result may occur with  improper specimen collection/handling, submission of specimen other than nasopharyngeal swab, presence of viral mutation(s) within the areas targeted by this assay, and inadequate number of viral copies(<138 copies/mL). A negative result must be combined with clinical observations, patient history, and epidemiological information. The expected  result is Negative.  Fact Sheet for Patients:  BloggerCourse.com  Fact Sheet for Healthcare Providers:  SeriousBroker.it  This test is no t yet approved or cleared by the [place] FDA and  has been authorized for detection and/or diagnosis of SARS-CoV-2 by FDA under an Emergency Use Authorization (EUA). This EUA will remain  in effect (meaning this test can be used) for the duration of the COVID-19 declaration under Section 564(b)(1) of the Act, 21 U.S.C.section 360bbb-3(b)(1), unless the authorization is terminated  or revoked sooner.       Influenza A by PCR NEGATIVE NEGATIVE Final   Influenza B by PCR NEGATIVE NEGATIVE Final    Comment: (NOTE) The Xpert Xpress SARS-CoV-2/FLU/RSV plus assay is intended as an aid in the diagnosis of influenza from Nasopharyngeal swab specimens and should not be used as a sole basis  for treatment. Nasal washings and aspirates are unacceptable for Xpert Xpress SARS-CoV-2/FLU/RSV testing.  Fact Sheet for Patients: BloggerCourse.com  Fact Sheet for Healthcare Providers: SeriousBroker.it  This test is not yet approved or cleared by the [place] FDA and has been authorized for detection and/or diagnosis of SARS-CoV-2 by FDA under an Emergency Use Authorization (EUA). This EUA will remain in effect (meaning this test can be used) for the duration of the COVID-19 declaration under Section 564(b)(1) of the Act, 21 U.S.C. section 360bbb-3(b)(1), unless the authorization is terminated or revoked.     Resp Syncytial Virus by PCR NEGATIVE NEGATIVE Final    Comment: (NOTE) Fact Sheet for Patients: BloggerCourse.com  Fact Sheet for Healthcare Providers: SeriousBroker.it  This test is not yet approved or cleared by the [place] FDA and has been authorized for detection and/or diagnosis of SARS-CoV-2 by FDA under an Emergency Use Authorization (EUA). This EUA will remain in effect (meaning this test can be used) for the duration of the COVID-19 declaration under Section 564(b)(1) of the Act, 21 U.S.C. section 360bbb-3(b)(1), unless the authorization is terminated or revoked.  Performed at [facility], [address], [place], [place] [address]      Labs:   Basic Metabolic Panel: Recent Labs  Lab 09/02/23 1730 09/03/23 0426 09/04/23 0416 09/05/23 0413 09/06/23 0409  NA 135 134* 133* 135 133*  K 3.3* 3.9 3.9 3.9 4.0  CL 100 103 97* 105 99  CO2 22 23 23  19* 23  GLUCOSE 145* 172* 131* 123* 111*  BUN 22 24* 31* 32* 29*  CREATININE 1.56* 1.59* 1.87* 1.54* 1.47*  CALCIUM 9.3 8.7* 9.1 9.0 9.0  MG 2.7*  --  2.5*  --   --    Liver Function Tests: Recent Labs  Lab 09/02/23 1730  AST 16  ALT 13  ALKPHOS 89  BILITOT 1.0  PROT 8.1   ALBUMIN 3.8    CBC: Recent Labs  Lab 09/02/23 1730 09/03/23 0426 09/04/23 0416 09/05/23 0413  WBC 10.5 7.8 7.1 6.3  NEUTROABS 9.9*  --   --   --   HGB 15.1 13.9 12.5* 11.0*  HCT 44.4 43.6 38.8* 33.5*  MCV 88.8 91.0 91.9 91.5  PLT 343 277 253 213      CBG: Recent Labs  Lab 09/06/23 0740 09/06/23 1151 09/06/23 1617 09/06/23 2012 09/07/23 0748  GLUCAP 95 166* 111* 218* 116*     IMAGING STUDIES DG Swallowing Func-Speech Pathology  Result Date: 09/06/2023 Table formatting from the original result was not included. Modified Barium Swallow Study Patient Details Name: Troy Pande [name] MRN: [id number] Date of Birth:  [birth year] Today's Date: 09/06/2023 HPI/PMH: HPI: 82 year old male who presented form LTC with vomiting, SOB and weakness. Patient was admitted with pneumonia, acute hypoxic respiratory failure, chronic diastolic CHF, PMH: lung cancer, CKD III, COPD, PAF, hypokalemia, OSA, h/o R lower lobectomy, DNR. Shallow inspiration. 2. Persistent small right pleural effusion with infiltration in the right lower lung, possibly infection or atelectasis. 3. Perihilar infiltration may indicate superimposed edema. 08/26/2023 Chronic microvascular ischemic change and cerebral volume loss. 17 x 16 mm right perifissural nodule along the minor fissure, mildly progressive, raising the possibility of primary bronchogenic carcinoma versus metastasis.  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    Swallow evaluation ordered.  Pt's caregiver of several years, "T" present and admits to pt having coughing with liquids more than solids over the last several weeks. Clinical Impression: Clinical Impression: Patient presents with mild oral and moderate pharyngeal dysphagia with sensorimotor deficits resulting in both audible and silent aspiration of thin.   Oral manipulation was mildly impaired due to tongue weakness.  Impaired tongue base  retraction, and hyolaryngeal elevator muscle contraction noted resulting in impaired airway closure allowing aspiration of barium mixed with secretions. Pt did not sense min aspirates but cough noted with larger amount.  Aspirates were not clear with cued or reflexive coughing. Chin tuck posture did not prevent aspiration of thin liquids - but slight chin elevation was protective for nectar and tsps of thin. Pt has more difficulty with pharyngeal clearance of liquids than solids and dry swallows were helpful to decrease stasis.  Did not test barium tablet as pt reported he was tired from swallowing. A-P view note conducted as MBS chair was broken and IR chair does not allot for A-P view. Recommend regular/nectar diet and tsps of thin at any time with Bascom Levels water protocol implementation.  Using teach back, educated pt to strengthen his "hock" - forced expectoration and cough for airway protection. Follow up at next venue of care indicated please. Factors that may increase risk of adverse event in presence of aspiration Rubye Oaks & Clearance Coots 2021): Factors that may increase risk of adverse event in presence of aspiration Rubye Oaks & Clearance Coots 2021): Poor general health and/or compromised immunity; Frail or deconditioned; Weak cough Recommendations/Plan: Swallowing Evaluation Recommendations Swallowing Evaluation Recommendations Supervision: Patient able to self-feed Swallowing strategies  : Slow rate; Small bites/sips; effortful swallow (intermittent hard cough, if reflexively coughing - cough strongly to clear) Postural changes: Stay upright 30-60 min after meals; Position pt fully upright for meals Oral care recommendations: Oral care BID (2x/day) Treatment Plan Treatment Plan Treatment recommendations: Therapy as outlined in treatment plan below Follow-up recommendations: Skilled nursing-short term rehab (<3 hours/day) Functional status assessment: Patient has had a recent decline in their functional status and  demonstrates the ability to make significant improvements in function in a reasonable and predictable amount of time. Treatment frequency: Min 1x/week Treatment duration: 2 weeks Interventions: Aspiration precaution training; Compensatory techniques; Trials of upgraded texture/liquids; Patient/family education; Diet toleration management by SLP Recommendations Recommendations for follow up therapy are one component of a multi-disciplinary discharge planning process, led by the attending physician.  Recommendations may be updated based on patient status, additional functional criteria and insurance authorization. Assessment: Orofacial Exam: Orofacial Exam Oral Cavity: Oral Hygiene: Pooled secretions Oral Cavity - Dentition: Missing dentition; Other (Comment); Adequate natural dentition (some dentition missing) Orofacial Anatomy: WFL Oral Motor/Sensory Function: WFL Anatomy: Anatomy: WFL Boluses Administered: Boluses Administered Boluses Administered:  Thin liquids (Level 0); Mildly thick liquids (Level 2, nectar thick); Moderately thick liquids (Level 3, honey thick); Puree; Solid  Oral Impairment Domain: Oral Impairment Domain Lip Closure: No labial escape Tongue control during bolus hold: Cohesive bolus between tongue to palatal seal Bolus preparation/mastication: Slow prolonged chewing/mashing with complete recollection Bolus transport/lingual motion: Delayed initiation of tongue motion (oral holding) Oral residue: Trace residue lining oral structures Location of oral residue : Tongue Initiation of pharyngeal swallow : Pyriform sinuses; Posterior laryngeal surface of the epiglottis  Pharyngeal Impairment Domain: Pharyngeal Impairment Domain Soft palate elevation: No bolus between soft palate (SP)/pharyngeal wall (PW) Laryngeal elevation: Partial superior movement of thyroid cartilage/partial approximation of arytenoids to epiglottic petiole Anterior hyoid excursion: Partial anterior movement Epiglottic movement:  Partial inversion Laryngeal vestibule closure: Incomplete, narrow column air/contrast in laryngeal vestibule Pharyngeal stripping wave : Present - diminished Pharyngeal contraction (A/P view only): N/A Pharyngoesophageal segment opening: Complete distension and complete duration, no obstruction of flow Tongue base retraction: Trace column of contrast or air between tongue base and PPW Pharyngeal residue: Trace residue within or on pharyngeal structures Location of pharyngeal residue: Valleculae; Pyriform sinuses; Tongue base; Aryepiglottic folds  Esophageal Impairment Domain: Esophageal Impairment Domain Esophageal clearance upright position: Complete clearance, esophageal coating Pill: Pill Consistency administered: -- (DNT) Penetration/Aspiration Scale Score: Penetration/Aspiration Scale Score 1.  Material does not enter airway: Moderately thick liquids (Level 3, honey thick); Puree; Solid 3.  Material enters airway, remains ABOVE vocal cords and not ejected out: Mildly thick liquids (Level 2, nectar thick) 8.  Material enters airway, passes BELOW cords without attempt by patient to eject out (silent aspiration) : Thin liquids (Level 0) Compensatory Strategies: Compensatory Strategies Compensatory strategies: Yes Chin tuck: Ineffective Ineffective Chin Tuck: Thin liquid (Level 0) Posterior head tilt: Effective; Ineffective Effective Posterior head tilt: Mildly thick liquid (Level 2, nectar thick) Ineffective Posterior head tilt: Thin liquid (Level 0) Other(comment): Effective (counting 1,2,3... swallow)   General Information: Caregiver present: No  Diet Prior to this Study: Regular; Thin liquids (Level 0)   Temperature : Normal   Respiratory Status: WFL   Supplemental O2: Nasal cannula   History of Recent Intubation: No  Behavior/Cognition: Alert; Cooperative Self-Feeding Abilities: Needs assist with self-feeding Baseline vocal quality/speech: Hypophonia/low volume Volitional Cough: Able to elicit Volitional  Swallow: Able to elicit Exam Limitations: Fatigue Goal Planning: Prognosis for improved oropharyngeal function: Guarded Barriers to Reach Goals: Overall medical prognosis No data recorded Patient/Family Stated Goal: pt wants to be done with testing Consulted and agree with results and recommendations: Patient Pain: Pain Assessment Pain Assessment: No/denies pain Breathing: 0 Negative Vocalization: 0 Facial Expression: 0 Body Language: 0 Consolability: 0 PAINAD Score: 0 End of Session: Start Time:SLP Start Time (ACUTE ONLY): 0830 Stop Time: SLP Stop Time (ACUTE ONLY): 0855 Time Calculation:SLP Time Calculation (min) (ACUTE ONLY): 25 min Charges: SLP Evaluations $ SLP Speech Visit: 1 Visit SLP Evaluations $MBS Swallow: 1 Procedure $Swallowing Treatment: 1 Procedure SLP visit diagnosis: SLP Visit Diagnosis: Dysphagia, oropharyngeal phase (R13.12) Past Medical History: Past Medical History: Diagnosis Date  AAA (abdominal aortic aneurysm) (HCC) 10-20-17  MONITORED BY DR Edilia Bo, 3.7 CM by ABDOMINAL US 11/2021  Anxiety   Arthritis   Atrial fibrillation (HCC)   Basal cell carcinoma   BPH (benign prostatic hypertrophy)   Chronic diastolic heart failure (HCC)   Chronic kidney disease   stage 3, pt unaware  Colon cancer (HCC)   COPD (chronic obstructive pulmonary disease) (HCC)   Depression   GERD (gastroesophageal  reflux disease)   Glaucoma BOTH EYES  Dr Nile Riggs  History of lung cancer APRIL 2012  ONCOLOGIST- DR Mariel Sleet  LOV [facility] 10-27-2012 SQUAMOUS CELL---- S/P RIGHT LOWER LOBECTOMY AT [facility] -- NO CHEMORADIATION--- NO RECURRENCE  Hx of adenomatous colonic polyps 2005   X 2; 1 hyperplastic polyp; Dr Juanda Chance  Hyperlipidemia   Hypertension   Nausea & vomiting 09/02/2023  OSA (obstructive sleep apnea) 08/29/2015  CPAP SET ON 10  Peripheral vascular disease (HCC)   FOLLOWED  BY DR DICKSON  Spinal stenosis 06/2014  Thoracic aorta atherosclerosis (HCC)   Thyrotoxicosis   amiodarone induced Past Surgical History: Past Surgical  History: Procedure Laterality Date  ABDOMINAL AORTOGRAM W/LOWER EXTREMITY N/A 12/20/2022  Procedure: ABDOMINAL AORTOGRAM W/LOWER EXTREMITY;  Surgeon: Cephus Shelling, MD;  Location: [facility];  Service: Cardiovascular;  Laterality: N/A;  ANGIO PLASTY    X 4 in legs  AORTOGRAM  07-27-2002  MILD DIFFUSE ILIAC ARTERY OCCLUSIVE DISEASE /  LEFT RENAL ARTERY 20%/ PATENT LEFT FEM-POP GRAFT/ MILD SFA AND POPLITEAL ARTERY OCCLUSIVE DISEASE W/ SEVERE KIDNEY OCCLUSIVE DISEASE  ATRIAL FIBRILLATION ABLATION N/A 04/09/2019  Procedure: ATRIAL FIBRILLATION ABLATION;  Surgeon: Hillis Range, MD;  Location: [facility];  Service: Cardiovascular;  Laterality: N/A;  ATRIAL FIBRILLATION ABLATION N/A 02/12/2020  Procedure: ATRIAL FIBRILLATION ABLATION;  Surgeon: Hillis Range, MD;  Location: [facility];  Service: Cardiovascular;  Laterality: N/A;  BALLOON DILATION N/A 02/06/2022  Procedure: BALLOON Tamponade ampulla;  Surgeon: Lemar Lofty., MD;  Location: [facility];  Service: Gastroenterology;  Laterality: N/A;  BASAL CELL CARCINOMA EXCISION    MULTIPLE TIMES--  RIGHT FOREARM, CHEEKS, AND BACK  BIOPSY  06/25/2018  Procedure: BIOPSY;  Surgeon: Meridee Score Netty Starring., MD;  Location: [facility] ENDOSCOPY;  Service: Gastroenterology;;  BIOPSY  02/06/2022  Procedure: BIOPSY;  Surgeon: Lemar Lofty., MD;  Location: [facility]OSCOPY;  Service: Gastroenterology;;  BRONCHIAL BIOPSY  03/26/2023  Procedure: BRONCHIAL BIOPSIES;  Surgeon: [name], MD;  Location: [facility] ENDOSCOPY;  Service: Pulmonary;;  BRONCHIAL BRUSHINGS  03/26/2023  Procedure: BRONCHIAL BRUSHINGS;  Surgeon: [name], MD;  Location: [facility] ENDOSCOPY;  Service: Pulmonary;;  BRONCHIAL NEEDLE ASPIRATION BIOPSY  03/26/2023  Procedure: BRONCHIAL NEEDLE ASPIRATION BIOPSIES;  Surgeon: [name], MD;  Location: [facility] ENDOSCOPY;  Service: Pulmonary;;  BRONCHIAL WASHINGS  03/26/2023  Procedure: BRONCHIAL WASHINGS;  Surgeon: [name], MD;   Location: [facility] ENDOSCOPY;  Service: Pulmonary;;  CARDIOVASCULAR STRESS TEST  12-08-2012  DR MCLEAN  NORMAL LEXISCAN WITH NO EXERCISE NUCLEAR STUDY/ EF 66%/   NO ISCHEMIA/ NO SIGNIFICANT CHANGE FROM PRIOR STUDY  CARDIOVERSION N/A 02/14/2018  Procedure: CARDIOVERSION;  Surgeon: Laurey Morale, MD;  Location: [facility] ENDOSCOPY;  Service: Cardiovascular;  Laterality: N/A;  CARDIOVERSION N/A 07/10/2022  Procedure: CARDIOVERSION;  Surgeon: Laurey Morale, MD;  Location: [facility] ENDOSCOPY;  Service: Cardiovascular;  Laterality: N/A;  CAROTID ANGIOGRAM N/A 07/10/2013  Procedure: CAROTID ANGIOGRAM;  Surgeon: Sherren Kerns, MD;  Location: [facility] CATH LAB;  Service: Cardiovascular;  Laterality: N/A;  CAROTID ENDARTERECTOMY Right 07-14-13  cea  CATARACT EXTRACTION W/ INTRAOCULAR LENS  IMPLANT, BILATERAL    colon polyectomy    COLONOSCOPY    COLONOSCOPY WITH PROPOFOL N/A 06/25/2018  Procedure: COLONOSCOPY WITH PROPOFOL;  Surgeon: Meridee Score Netty Starring., MD;  Location: [facility] ENDOSCOPY;  Service: Gastroenterology;  Laterality: N/A;  CYSTOSCOPY W/ RETROGRADES Bilateral 01/21/2013  Procedure: CYSTOSCOPY WITH RETROGRADE PYELOGRAM;  Surgeon: Milford Cage, MD;  Location: [facility];  Service: Urology;  Laterality: Bilateral;  CYSTO, BLADDER BIOPSY, BILATERAL RETROGRADE PYELOGRAM  RAD TECH FROM RADIOLOGY PER JOY  CYSTOSCOPY WITH BIOPSY N/A 01/21/2013  Procedure: CYSTOSCOPY WITH BIOPSY;  Surgeon: Milford Cage, MD;  Location: [facility];  Service: Urology;  Laterality: N/A;  ENDARTERECTOMY Right 07/14/2013  Procedure: ENDARTERECTOMY CAROTID;  Surgeon: Chuck Hint, MD;  Location: [facility] OR;  Service: Vascular;  Laterality: Right;  ENDOSCOPIC RETROGRADE CHOLANGIOPANCREATOGRAPHY (ERCP) WITH PROPOFOL N/A 02/06/2022  Procedure: ENDOSCOPIC RETROGRADE CHOLANGIOPANCREATOGRAPHY (ERCP) WITH PROPOFOL;  Surgeon: Lemar Lofty., MD;  Location: [facility] ENDOSCOPY;  Service: Gastroenterology;  Laterality: N/A;   EP IMPLANTABLE DEVICE N/A 08/12/2015  Procedure: Loop Recorder Insertion;  Surgeon: Hillis Range, MD;  Location: [facility];  Service: Cardiovascular;  Laterality: N/A;  ESOPHAGOGASTRODUODENOSCOPY Left 02/13/2022  Procedure: ESOPHAGOGASTRODUODENOSCOPY (EGD);  Surgeon: Shellia Cleverly, DO;  Location: [facility] ENDOSCOPY;  Service: Gastroenterology;  Laterality: Left;  ESOPHAGOGASTRODUODENOSCOPY (EGD) WITH PROPOFOL N/A 06/25/2018  Procedure: ESOPHAGOGASTRODUODENOSCOPY (EGD) WITH PROPOFOL;  Surgeon: Meridee Score Netty Starring., MD;  Location: [facility] ENDOSCOPY;  Service: Gastroenterology;  Laterality: N/A;  ESOPHAGOGASTRODUODENOSCOPY (EGD) WITH PROPOFOL N/A 07/10/2018  Procedure: ESOPHAGOGASTRODUODENOSCOPY (EGD) WITH PROPOFOL;  Surgeon: Rachael Fee, MD;  Location: [facility] ENDOSCOPY;  Service: Endoscopy;  Laterality: N/A;  EUS N/A 07/10/2018  Procedure: UPPER ENDOSCOPIC ULTRASOUND (EUS) RADIAL;  Surgeon: Rachael Fee, MD;  Location: [facility] ENDOSCOPY;  Service: Endoscopy;  Laterality: N/A;  EYE SURGERY Right   FEMORAL-POPLITEAL BYPASS GRAFT Left 1994  [facility]  AND 2001  IN [place]  FEMORAL-POPLITEAL BYPASS GRAFT Left 08/30/2015  Procedure: REVISION OF BYPASS GRAFT Left  FEMORAL-POPLITEAL ARTERY;  Surgeon: Chuck Hint, MD;  Location: [facility] OR;  Service: Vascular;  Laterality: Left;  FINE NEEDLE ASPIRATION N/A 07/10/2018  Procedure: FINE NEEDLE ASPIRATION (FNA) LINEAR;  Surgeon: Rachael Fee, MD;  Location: [facility] ENDOSCOPY;  Service: Endoscopy;  Laterality: N/A;  FLEXIBLE SIGMOIDOSCOPY N/A 12/12/2018  Procedure: FLEXIBLE SIGMOIDOSCOPY;  Surgeon: Andria Meuse, MD;  Location: [facility] ORS;  Service: General;  Laterality: N/A;  HEMOSTASIS CLIP PLACEMENT  02/13/2022  Procedure: HEMOSTASIS CLIP PLACEMENT;  Surgeon: Shellia Cleverly, DO;  Location: [facility] ENDOSCOPY;  Service: Gastroenterology;;  HEMOSTASIS CONTROL  03/26/2023  Procedure: HEMOSTASIS CONTROL;  Surgeon: [name], MD;  Location: [facility] ENDOSCOPY;  Service:  Pulmonary;;  HOT HEMOSTASIS N/A 02/13/2022  Procedure: HOT HEMOSTASIS (ARGON PLASMA COAGULATION/BICAP);  Surgeon: Shellia Cleverly, DO;  Location: [facility] ENDOSCOPY;  Service: Gastroenterology;  Laterality: N/A;  LARYNGOSCOPY  06-27-2004  BX VOCAL CORD  (LEUKOPLAKIA)  PER PT NO ISSUES SINCE  LEFT HEART CATH AND CORONARY ANGIOGRAPHY N/A 08/20/2017  Procedure: LEFT HEART CATH AND CORONARY ANGIOGRAPHY;  Surgeon: Laurey Morale, MD;  Location: [facility] INVASIVE CV LAB;  Service: Cardiovascular;  Laterality: N/A;  LOOP RECORDER INSERTION N/A 04/09/2019  Procedure: LOOP RECORDER INSERTION;  Surgeon: Hillis Range, MD;  [facility]: [facility] INVASIVE CV LAB;  Service: Cardiovascular;  Laterality: N/A;  LOOP RECORDER REMOVAL N/A 04/09/2019  Procedure: LOOP RECORDER REMOVAL;  Surgeon: Hillis Range, MD;  Location: [facility] INVASIVE CV LAB;  Service: Cardiovascular;  Laterality: N/A;  LOWER EXTREMITY ANGIOGRAM Bilateral 08/29/2015  Procedure: Lower Extremity Angiogram;  Surgeon: Chuck Hint, MD;  Location: [facility] INVASIVE CV LAB;  Service: Cardiovascular;  Laterality: Bilateral;  LUNG LOBECTOMY  01/24/2011   RIGHT UPPER LOBE  (SQUAMOUS CELL CARCINOMA) Dr Desmond Lope , [facility]. No chemotherapyor radiation  PANCREATIC STENT PLACEMENT  02/06/2022  Procedure: PANCREATIC STENT PLACEMENT;  Surgeon: Lemar Lofty., MD;  Location:  [facility] ENDOSCOPY;  Service: Gastroenterology;;  [facility] ANGIOPLASTY Right 07/14/2013  Procedure: PATCH ANGIOPLASTY;  Surgeon: Chuck Hint, MD;  Location: [facility] OR;  Service: Vascular;  Laterality: Right;  PATCH ANGIOPLASTY Left 08/30/2015  Procedure: VEIN PATCH ANGIOPLASTY OF PROXIMAL Left BYPASS GRAFT;  Surgeon: Chuck Hint, MD;  Location: [facility] OR;  Service: Vascular;  Laterality: Left;  PERIPHERAL VASCULAR CATHETERIZATION N/A 08/29/2015  Procedure: Abdominal Aortogram;  Surgeon: Chuck Hint, MD;  Location: [facility] INVASIVE CV LAB;  Service: Cardiovascular;  Laterality: N/A;  POLYPECTOMY  06/25/2018  Procedure:  POLYPECTOMY;  Surgeon: Meridee Score Netty Starring., MD;  Location: [facility] ENDOSCOPY;  Service: Gastroenterology;;  POLYPECTOMY    POLYPECTOMY  02/13/2022  Procedure: POLYPECTOMY;  Surgeon: Shellia Cleverly, DO;  [facility] ENDOSCOPY;  Service: Gastroenterology;;  REMOVAL OF STONES  02/06/2022  Procedure: REMOVAL OF STONES;  Surgeon: Lemar Lofty., MD;  Location: [facility] ENDOSCOPY;  Service: Gastroenterology;;  Susa Day  02/13/2022  Procedure: Susa Day;  Surgeon: Shellia Cleverly, DO;  Location: [facility] ENDOSCOPY;  Service: Gastroenterology;;  Dennison Mascot  02/06/2022  Procedure: Dennison Mascot;  Surgeon: Lemar Lofty., MD;  Location: [facility] ENDOSCOPY;  Service: Gastroenterology;;  SUBMUCOSAL INJECTION  06/25/2018  Procedure: SUBMUCOSAL INJECTION;  Surgeon: Lemar Lofty., MD;  Location: [facility] ENDOSCOPY;  Service: Gastroenterology;;  TEE WITHOUT CARDIOVERSION N/A 02/14/2018  Procedure: TRANSESOPHAGEAL ECHOCARDIOGRAM (TEE);  Surgeon: Laurey Morale, MD;  Location: [facility] ENDOSCOPY;  Service: Cardiovascular;  Laterality: N/A;  trabecular surgery    OS  TRANSTHORACIC ECHOCARDIOGRAM  12-29-2012  [facility]  MILD LVH/  LVSF NORMAL/ EF 55-60%/  GRADE I DIASTOLIC DYSFUNCTION  VIDEO BRONCHOSCOPY WITH RADIAL ENDOBRONCHIAL ULTRASOUND  03/26/2023  Procedure: VIDEO BRONCHOSCOPY WITH RADIAL ENDOBRONCHIAL ULTRASOUND;  Surgeon: [name], MD;  [facility]: [facility] ENDOSCOPY;  Service: Pulmonary;; Rolena Infante, MS [facility] SLP Acute Rehab Services Office [phone] Chales Abrahams 09/06/2023, 9:32 AM  DG Chest 2 View  Result Date: 09/02/2023 CLINICAL DATA:  Suspected sepsis. New onset vomiting, weakness, and tremors. History of lung cancer not on treatment. EXAM: CHEST - 2 VIEW COMPARISON:  08/26/2023 FINDINGS: Shallow inspiration. Heart size and pulmonary vascularity are likely normal for technique. There is infiltration or atelectasis in the right mid lung with small right pleural effusion. This appears similar to the prior  study and could indicate atelectasis or pneumonia. Mild perihilar infiltration bilaterally may represent edema. No pneumothorax. Mediastinal contours appear intact. IMPRESSION: 1. Shallow inspiration. 2. Persistent small right pleural effusion with infiltration in the right lower lung, possibly infection or atelectasis. 3. Perihilar infiltration may indicate superimposed edema Electronically Signed   By: Burman Nieves M.D.   On: 09/02/2023 21:36   DG Pelvis Portable  Result Date: 08/26/2023 CLINICAL DATA:  Status post fall at home with back pain EXAM: PORTABLE PELVIS 1 VIEWS COMPARISON:  Pelvis radiograph dated 12/07/2022 FINDINGS: There is no evidence of pelvic fracture or diastasis. No pelvic bone lesions are seen. Vascular calcifications. Surgical clips project over the left inguinal region. Surgical sutures project over the left hemipelvis. IMPRESSION: No radiographic finding of acute displaced fracture. Electronically Signed   By: Agustin Cree M.D.   On: 08/26/2023 10:21   CT CERVICAL SPINE WO CONTRAST  Result Date: 08/26/2023 CLINICAL DATA:  Polytrauma, blunt EXAM: CT CERVICAL SPINE WITHOUT CONTRAST TECHNIQUE: Multidetector CT imaging of the cervical spine was performed without intravenous contrast. Multiplanar CT image reconstructions were also generated. RADIATION DOSE REDUCTION: This exam was performed according to the departmental dose-optimization program which includes automated exposure control, adjustment  of the mA and/or kV according to patient size and/or use of iterative reconstruction technique. COMPARISON:  12/07/2022 FINDINGS: Alignment: Facet joints are aligned without dislocation or traumatic listhesis. Dens and lateral masses are aligned. Skull base and vertebrae: Acute mild superior endplate compression fracture of the T3 vertebral body with 15-20% vertebral body height loss. No retropulsion. Vertebral bodies of the cervical spine are intact without fracture. No focal bone lesion.  Soft tissues and spinal canal: No prevertebral fluid or swelling. No visible canal hematoma. Disc levels: Similar degree of advanced multilevel cervical spondylosis. Upper chest: 2.0 x 1.6 cm site of extrapleural nodularity at the apex of the right hemithorax (series 5, image 112), not appreciably changed compared to the recent prior CT. Other: None. IMPRESSION: 1. Acute mild superior endplate compression fracture of the T3 vertebral body. 2. No acute fracture or traumatic listhesis of the cervical spine. 3. 2.0 x 1.6 cm site of extrapleural nodularity at the apex of the right hemithorax, not appreciably changed compared to the recent prior CT. Electronically Signed   By: Duanne Guess D.O.   On: 08/26/2023 10:20   CT HEAD WO CONTRAST  Result Date: 08/26/2023 CLINICAL DATA:  Head trauma, moderate-severe EXAM: CT HEAD WITHOUT CONTRAST TECHNIQUE: Contiguous axial images were obtained from the base of the skull through the vertex without intravenous contrast. RADIATION DOSE REDUCTION: This exam was performed according to the departmental dose-optimization program which includes automated exposure control, adjustment of the mA and/or kV according to patient size and/or use of iterative reconstruction technique. COMPARISON:  12/07/2022 FINDINGS: Brain: No evidence of acute infarction, hemorrhage, hydrocephalus, extra-axial collection or mass lesion/mass effect. Patchy low-density changes within the periventricular and subcortical white matter most compatible with chronic microvascular ischemic change. Mild-moderate diffuse cerebral volume loss. Vascular: Atherosclerotic calcifications involving the large vessels of the skull base. No unexpected hyperdense vessel. Skull: Normal. Negative for fracture or focal lesion. Sinuses/Orbits: No acute finding. Other: None. IMPRESSION: 1. No acute intracranial findings. 2. Chronic microvascular ischemic change and cerebral volume loss. Electronically Signed   By: Duanne Guess D.O.   On: 08/26/2023 10:12   DG Chest Port 1 View  Result Date: 08/26/2023 CLINICAL DATA:  Trauma EXAM: PORTABLE CHEST 1 VIEW COMPARISON:  07/31/2023 FINDINGS: Left chest wall loop recorder. Normal heart size. Volume loss in the right lung. Similar chronic right-sided pleural effusion. Increasing bilateral perihilar and bibasilar opacities. No left-sided pleural effusion. No pneumothorax. IMPRESSION: 1. Increasing bilateral perihilar and bibasilar opacities, which may represent edema or infection. 2. Similar chronic right-sided pleural effusion. Electronically Signed   By: Duanne Guess D.O.   On: 08/26/2023 10:11   CT Chest Wo Contrast  Result Date: 08/24/2023 CLINICAL DATA:  History of colon and lung cancer, follow-up right lung mass EXAM: CT CHEST WITHOUT CONTRAST TECHNIQUE: Multidetector CT imaging of the chest was performed following the standard protocol without IV contrast. RADIATION DOSE REDUCTION: This exam was performed according to the departmental dose-optimization program which includes automated exposure control, adjustment of the mA and/or kV according to patient size and/or use of iterative reconstruction technique. COMPARISON:  PET-CT dated 03/25/2023 FINDINGS: Cardiovascular: The heart is normal in size. Rightward cardiomediastinal shift. No pericardial effusion. No evidence of thoracic aortic aneurysm. Atherosclerotic calcifications of the aortic arch. Moderate three-vessel coronary atherosclerosis. Mediastinum/Nodes: Small mediastinal lymph nodes, including a 10 mm short axis AP window node (series 2/image 50), previously 9 mm. 17 mm short axis superior right paratracheal node (series 2/image 22), previously 14 mm. Visualized  thyroid is unremarkable. Lungs/Pleura: Status post right lower lobectomy. 17 x 16 mm right perifissural nodule along the minor fissure (series 4/image 94), previously 16 x 14 mm, mildly progressive, raising the possibility of primary bronchogenic  carcinoma versus metastasis. Additional band like opacity in the posterior right upper lobe along the minor fissure (series 4/image 80), similar versus mildly improved, favoring post treatment changes on PET. Moderate centrilobular and paraseptal emphysematous changes, upper lung predominant. No suspicious pulmonary nodules. Small right pleural effusion, partially loculated with chronic pleural thickening, unchanged. No pneumothorax. Upper Abdomen: Visualized upper abdomen is grossly unremarkable, noting vascular calcifications. Musculoskeletal: Degenerative changes of the visualized thoracolumbar spine. IMPRESSION: Status post right lower lobectomy. 17 x 16 mm right perifissural nodule along the minor fissure, mildly progressive, raising the possibility of primary bronchogenic carcinoma versus metastasis. Additional band like opacity in the posterior right upper lobe along the minor fissure, similar versus mildly improved, favoring post treatment changes on PET. Small mediastinal lymph nodes, mildly progressive, suspicious for nodal metastases. Aortic Atherosclerosis (ICD10-I70.0) and Emphysema (ICD10-J43.9). Electronically Signed   By: Charline Bills M.D.   On: 08/24/2023 00:27    DISCHARGE EXAMINATION: Vitals:   09/06/23 2249 09/07/23 0500 09/07/23 0806 09/07/23 0905  BP:    (!) 140/60  Pulse: 60   66  Resp: 18     Temp:      TempSrc:      SpO2: 95%  95% 93%  Weight:  73.3 kg    Height:        General appearance: Awake alert.  In no distress.  Mildly distracted Resp: Normal effort at rest.  Coarse breath sounds bilaterally with few crackles at the bases.  No wheezing or rhonchi. Cardio: S1-S2 is normal regular.  No S3-S4.  No rubs murmurs or bruit GI: Abdomen is soft.  Nontender nondistended.  Bowel sounds are present normal.  No masses organomegaly    DISPOSITION: SNF  Discharge Instructions     Call MD for:  difficulty breathing, headache or visual disturbances   Complete by: As  directed    Call MD for:  extreme fatigue   Complete by: As directed    Call MD for:  persistant dizziness or light-headedness   Complete by: As directed    Call MD for:  persistant nausea and vomiting   Complete by: As directed    Call MD for:  severe uncontrolled pain   Complete by: As directed    Call MD for:  temperature >100.4   Complete by: As directed    Diet - low sodium heart healthy   Complete by: As directed    Discharge instructions   Complete by: As directed    Please review discharge summary for instructions.  You were cared for by a hospitalist during your hospital stay. If you have any questions about your discharge medications or the care you received while you were in the hospital after you are discharged, you can call the unit and asked to speak with the hospitalist on call if the hospitalist that took care of you is not available. Once you are discharged, your primary care physician will handle any further medical issues. Please note that NO REFILLS for any discharge medications will be authorized once you are discharged, as it is imperative that you return to your primary care physician (or establish a relationship with a primary care physician if you do not have one) for your aftercare needs so that they can reassess your need for  medications and monitor your lab values. If you do not have a primary care physician, you can call [phone] for a physician referral.   Increase activity slowly   Complete by: As directed          Allergies as of 09/07/2023       Reactions   Niacin-lovastatin Er Shortness Of Breath, Other (See Comments)   Dyspnea Flushing *Not listed on MAR    Penicillins Hives, Shortness Of Breath, Other (See Comments)   Dyspnea Flushing Because of a history of documented adverse serious drug reaction;Medi Alert bracelet  is recommended PCN reaction causing immediate rash, facial/tongue/throat swelling, SOB or lightheadedness with hypotension:  Yes PCN reaction causing severe rash involving mucus membranes or skin necrosis: No PCN reaction occurring within the last 10 years: NO PCN reaction that required hospitalization: NO   Sulfonamide Derivatives Hives, Shortness Of Breath, Other (See Comments)   Dyspnea  Flushing Because of a history of documented adverse serious drug reaction;Medi Alert bracelet  is recommended   Lipitor [atorvastatin] Other (See Comments)   Myalgias Arthralgias  *Not listed on MAR         Medication List     TAKE these medications    acetaminophen 325 MG tablet Commonly known as: TYLENOL Take 650 mg by mouth every 6 (six) hours as needed for moderate pain or headache.   acetaminophen 500 MG tablet Commonly known as: TYLENOL Take 1 tablet (500 mg total) by mouth every 6 (six) hours as needed.   albuterol 108 (90 Base) MCG/ACT inhaler Commonly known as: VENTOLIN HFA Inhale 2 puffs into the lungs every 6 (six) hours as needed for wheezing or shortness of breath.   amLODipine 10 MG tablet Commonly known as: NORVASC Take 10 mg by mouth daily.   apixaban 2.5 MG Tabs tablet Commonly known as: Eliquis Take 1 tablet (2.5 mg total) by mouth 2 (two) times daily. Okay to restart this medication on 03/27/2023   azithromycin 500 MG tablet Commonly known as: ZITHROMAX Take 1 tablet (500 mg total) by mouth daily for 2 days.   Breztri Aerosphere 160-9-4.8 MCG/ACT Aero Generic drug: Budeson-Glycopyrrol-Formoterol Inhale 2 puffs into the lungs in the morning and at bedtime.   cefdinir 300 MG capsule Commonly known as: OMNICEF Take 1 capsule (300 mg total) by mouth every 12 (twelve) hours for 4 days.   diclofenac Sodium 1 % Gel Commonly known as: Voltaren Apply 2 g topically 4 (four) times daily. What changed: when to take this   docusate sodium 100 MG capsule Commonly known as: COLACE Take 100 mg by mouth 2 (two) times daily as needed for moderate constipation.   escitalopram 20 MG  tablet Commonly known as: Lexapro Take 1 tablet (20 mg total) by mouth daily.   furosemide 20 MG tablet Commonly known as: LASIX Take 2 tablets (40 mg total) by mouth daily. What changed: how much to take   Jardiance 10 MG Tabs tablet Generic drug: empagliflozin TAKE ONE TABLET BY MOUTH EVERY DAY BEFORE BREAKFAST.   latanoprost 0.005 % ophthalmic solution Commonly known as: XALATAN Place 1 drop into both eyes at bedtime.   lidocaine 5 % Commonly known as: Lidoderm Place 1 patch onto the skin daily. Remove & Discard patch within 12 hours or as directed by MD   loperamide 2 MG tablet Commonly known as: IMODIUM A-D Take 2 mg by mouth 2 (two) times daily as needed for diarrhea or loose stools.   Melatonin 10 MG Tabs Take 20  mg by mouth at bedtime.   metoprolol succinate 25 MG 24 hr tablet Commonly known as: TOPROL-XL Take 0.5 tablets (12.5 mg total) by mouth daily.   montelukast 10 MG tablet Commonly known as: SINGULAIR TAKE ONE TABLET BY MOUTH AT BEDTIME What changed: when to take this   nystatin 100000 UNIT/ML suspension Commonly known as: MYCOSTATIN Use as directed 5 mLs (500,000 Units total) in the mouth or throat 4 (four) times daily for 5 days.   omeprazole 20 MG capsule Commonly known as: PRILOSEC Take 20 mg by mouth daily.   potassium chloride SA 20 MEQ tablet Commonly known as: KLOR-CON M TAKE TWO TABLETS BY MOUTH IN THE MORNING AND TAKE ONE TABLET IN THE EVENING What changed: See the new instructions.   PRESCRIPTION MEDICATION Pt uses CPAP machine at bedtime   rosuvastatin 40 MG tablet Commonly known as: CRESTOR TAKE ONE TABLET BY MOUTH EVERY DAY   senna-docusate 8.6-50 MG tablet Commonly known as: Senokot-S Take 1 tablet by mouth daily.   Simbrinza 1-0.2 % Susp Generic drug: Brinzolamide-Brimonidine Place 1 drop into both eyes 3 (three) times daily. 8a, 2p, 8p   spironolactone 25 MG tablet Commonly known as: ALDACTONE Take 25 mg by mouth  daily.   Systane Ultra 0.4-0.3 % Soln Generic drug: Polyethyl Glycol-Propyl Glycol Place 1 drop into both eyes daily as needed (dry eyes).          Contact information for follow-up providers     Pincus Sanes, MD. Schedule an appointment as soon as possible for a visit in 1 week(s).   Specialty: Internal Medicine Why: post hospitalization follow up Contact information: [address] [place] [address] [phone]              Contact information for after-discharge care     Destination     [facility] Preferred SNF .   Service: Skilled Nursing Contact information: [address] [place] [address] [phone]                     TOTAL DISCHARGE TIME: 35 minutes  Amzie Sillas Rito Ehrlich  [facility] Pager on [email or web address]  09/07/2023, 9:57 AM

## 2023-09-07 NOTE — TOC Transition Note (Addendum)
Transition of Care [facility]) - CM/SW Discharge Note   Patient Details  Name: Troy Zeiders [name] MRN: [id number] Date of Birth: 11-05-1940  Transition of Care [facility]) CM/SW Contact:  Darleene Cleaver, LCSW Phone Number: 09/07/2023, 1:26 PM   Clinical Narrative:     Patient originally denied for SNF and patient's insurance company requested a peer to peer.  Peer to peer completed yesterday, and per [employer], medical director Dr. Logan Bores he had not made a decision about patient approval for SNF or not.  CSW contacted patient's daughter Troy [name] [phone] to let her know that insurance final decision was still pending.  CSW received collateral information from daughter who said that normally patient is able to transfer himself from bed to wheelchair, commode to wheelchair, and to feed himself.  Patient also only needs minimal assistance with dressing, and usually on a regular diet.  Patient also does not normally use oxygen, he is currently on oxygen here at the hospital.    This CSW contacted [facility] medical director Dr, Logan Bores to discuss case with him.  CSW informed Dr. Logan Bores, that patient was not back to his base line yet.  CSW explained what information was given by patient's daughter and discussed the situation.  Per Dr. Logan Bores he will agree to approve for SNF placement at [facility], but palliative should follow him at [facility].  Referral made to [facility] for outpatient palliative to follow at [facility].    Authorization number for SNF is [id number], authorization for EMS is [phone].  Dr. Logan Bores also expressed his concern that patient may not return back to base line to return back to the ALF.  Dr. Logan Bores requested that family is made aware that patient may not return to base line, and may need a higher level of care then ALF at [facility].  CSW assured Dr. Logan Bores that the information will be shared with the daughter.  Dr. Logan Bores stated he will also try to speak to the daughter if he has a  chance regarding patient's disposition after SNF.    CSW contacted patient's daughter and shared information that Dr. Logan Bores suggested they begin thinking about options if patient is not able to return back to [facility].  Patient's daughter expressed understanding, and asked if there is someone at SNF who can help navigate options.  CSW informed her that yes there is a Child psychotherapist at SNF who can help with discharge planning if patient is not able to return back to ALF.  CSW also informed daughter that [facility] will follow him for palliative in case patient does get worse, and my need hospice services.  Patient's daughter expressed understanding and was appreciative of the information given.    CSW updated Nikki in admissions at [facility] that patient may not be able to rehab back to his baseline.  Lowella Bandy said she will follow up with patient's daughter as well while he is at [facility].   Per Lowella Bandy at [facility] patient can come today. Patient to be d/c'ed today to [facility].  Patient and family agreeable to plans will transport via ems RN to call report o [phone].  Per patient's daughter she would like to be called once EMS arrives to pick up patient.  CSW notified bedside nurse.      Final next level of care: Skilled Nursing Facility Barriers to Discharge: Barriers Resolved   Patient Goals and CMS Choice CMS Medicare.gov Compare Post Acute Care list provided to:: Patient  Represenative (must comment) Choice offered to / list presented to : Adult Children  Discharge Placement  [facility].   Existing PASRR number confirmed : 09/04/23          Patient chooses bed at: [facility] Patient to be transferred to facility by: [facility] Name of family member notified: Daughter Troy [name] [phone] Patient and family notified of of transfer: 09/07/23  Discharge Plan and Services   Additional resources added to the After Visit Summary for                                        Social Determinants of Health (SDOH) Interventions SDOH Screenings   Food Insecurity: No Food Insecurity (09/03/2023)  Housing: Low Risk  (09/03/2023)  Transportation Needs: No Transportation Needs (09/03/2023)  Utilities: Not At Risk (09/03/2023)  Alcohol Screen: Low Risk  (10/05/2022)  Depression (PHQ2-9): Low Risk  (04/08/2023)  Financial Resource Strain: Low Risk  (10/05/2022)  Physical Activity: Inactive (10/05/2022)  Social Connections: Socially Isolated (10/05/2022)  Stress: No Stress Concern Present (10/05/2022)  Tobacco Use: Medium Risk (09/02/2023)     Readmission Risk Interventions    08/29/2022   11:58 AM 07/04/2021    3:08 PM 07/04/2021    1:14 PM  Readmission Risk Prevention Plan  Transportation Screening Complete Complete Complete  PCP or Specialist Appt within 3-5 Days Complete    HRI or Home Care Consult Complete    Social Work Consult for Recovery Care Planning/Counseling Complete    Palliative Care Screening Not Applicable    Medication Review Oceanographer) Complete Referral to Pharmacy Referral to Pharmacy  PCP or Specialist appointment within 3-5 days of discharge  Complete Complete  HRI or Home Care Consult  Complete Complete  SW Recovery Care/Counseling Consult  Complete   Palliative Care Screening  Not Applicable   Skilled Nursing Facility  Patient Refused

## 2023-09-09 DIAGNOSIS — R2689 Other abnormalities of gait and mobility: Secondary | ICD-10-CM | POA: Diagnosis not present

## 2023-09-09 DIAGNOSIS — J189 Pneumonia, unspecified organism: Secondary | ICD-10-CM | POA: Diagnosis not present

## 2023-09-09 DIAGNOSIS — R1312 Dysphagia, oropharyngeal phase: Secondary | ICD-10-CM | POA: Diagnosis not present

## 2023-09-09 DIAGNOSIS — J449 Chronic obstructive pulmonary disease, unspecified: Secondary | ICD-10-CM | POA: Diagnosis not present

## 2023-09-09 DIAGNOSIS — I480 Paroxysmal atrial fibrillation: Secondary | ICD-10-CM | POA: Diagnosis not present

## 2023-09-09 DIAGNOSIS — I5032 Chronic diastolic (congestive) heart failure: Secondary | ICD-10-CM | POA: Diagnosis not present

## 2023-09-09 DIAGNOSIS — M6281 Muscle weakness (generalized): Secondary | ICD-10-CM | POA: Diagnosis not present

## 2023-09-10 DIAGNOSIS — J189 Pneumonia, unspecified organism: Secondary | ICD-10-CM | POA: Diagnosis not present

## 2023-09-10 DIAGNOSIS — I4891 Unspecified atrial fibrillation: Secondary | ICD-10-CM | POA: Diagnosis not present

## 2023-09-10 DIAGNOSIS — J449 Chronic obstructive pulmonary disease, unspecified: Secondary | ICD-10-CM | POA: Diagnosis not present

## 2023-09-10 DIAGNOSIS — J9601 Acute respiratory failure with hypoxia: Secondary | ICD-10-CM | POA: Diagnosis not present

## 2023-09-10 DIAGNOSIS — N1832 Chronic kidney disease, stage 3b: Secondary | ICD-10-CM | POA: Diagnosis not present

## 2023-09-10 DIAGNOSIS — G4733 Obstructive sleep apnea (adult) (pediatric): Secondary | ICD-10-CM | POA: Diagnosis not present

## 2023-09-10 DIAGNOSIS — I10 Essential (primary) hypertension: Secondary | ICD-10-CM | POA: Diagnosis not present

## 2023-09-10 DIAGNOSIS — I509 Heart failure, unspecified: Secondary | ICD-10-CM | POA: Diagnosis not present

## 2023-09-12 ENCOUNTER — Telehealth: Payer: Self-pay | Admitting: Radiation Oncology

## 2023-09-12 DIAGNOSIS — I509 Heart failure, unspecified: Secondary | ICD-10-CM | POA: Diagnosis not present

## 2023-09-12 DIAGNOSIS — N1832 Chronic kidney disease, stage 3b: Secondary | ICD-10-CM | POA: Diagnosis not present

## 2023-09-12 DIAGNOSIS — J449 Chronic obstructive pulmonary disease, unspecified: Secondary | ICD-10-CM | POA: Diagnosis not present

## 2023-09-12 DIAGNOSIS — G4733 Obstructive sleep apnea (adult) (pediatric): Secondary | ICD-10-CM | POA: Diagnosis not present

## 2023-09-12 DIAGNOSIS — R2689 Other abnormalities of gait and mobility: Secondary | ICD-10-CM | POA: Diagnosis not present

## 2023-09-12 DIAGNOSIS — R1312 Dysphagia, oropharyngeal phase: Secondary | ICD-10-CM | POA: Diagnosis not present

## 2023-09-12 DIAGNOSIS — E119 Type 2 diabetes mellitus without complications: Secondary | ICD-10-CM | POA: Diagnosis not present

## 2023-09-12 DIAGNOSIS — J189 Pneumonia, unspecified organism: Secondary | ICD-10-CM | POA: Diagnosis not present

## 2023-09-12 DIAGNOSIS — J9601 Acute respiratory failure with hypoxia: Secondary | ICD-10-CM | POA: Diagnosis not present

## 2023-09-12 DIAGNOSIS — M6281 Muscle weakness (generalized): Secondary | ICD-10-CM | POA: Diagnosis not present

## 2023-09-12 DIAGNOSIS — I480 Paroxysmal atrial fibrillation: Secondary | ICD-10-CM | POA: Diagnosis not present

## 2023-09-12 DIAGNOSIS — I5032 Chronic diastolic (congestive) heart failure: Secondary | ICD-10-CM | POA: Diagnosis not present

## 2023-09-12 NOTE — Telephone Encounter (Addendum)
12/12 @ 9:33 am Follow up call to patient's daughter to get patient's reschedule for his missed appointment on 12/4, patient currently in rehab facility.  Maybe a while, requested to follow up - per patient's daughter.

## 2023-09-16 ENCOUNTER — Encounter (HOSPITAL_COMMUNITY): Payer: PPO | Admitting: Cardiology

## 2023-09-16 DIAGNOSIS — R2689 Other abnormalities of gait and mobility: Secondary | ICD-10-CM | POA: Diagnosis not present

## 2023-09-16 DIAGNOSIS — J449 Chronic obstructive pulmonary disease, unspecified: Secondary | ICD-10-CM | POA: Diagnosis not present

## 2023-09-16 DIAGNOSIS — M6281 Muscle weakness (generalized): Secondary | ICD-10-CM | POA: Diagnosis not present

## 2023-09-16 DIAGNOSIS — R1312 Dysphagia, oropharyngeal phase: Secondary | ICD-10-CM | POA: Diagnosis not present

## 2023-09-16 DIAGNOSIS — I5032 Chronic diastolic (congestive) heart failure: Secondary | ICD-10-CM | POA: Diagnosis not present

## 2023-09-16 DIAGNOSIS — Z515 Encounter for palliative care: Secondary | ICD-10-CM | POA: Diagnosis not present

## 2023-09-16 DIAGNOSIS — J189 Pneumonia, unspecified organism: Secondary | ICD-10-CM | POA: Diagnosis not present

## 2023-09-16 DIAGNOSIS — I480 Paroxysmal atrial fibrillation: Secondary | ICD-10-CM | POA: Diagnosis not present

## 2023-09-19 DIAGNOSIS — M6281 Muscle weakness (generalized): Secondary | ICD-10-CM | POA: Diagnosis not present

## 2023-09-19 DIAGNOSIS — I5032 Chronic diastolic (congestive) heart failure: Secondary | ICD-10-CM | POA: Diagnosis not present

## 2023-09-19 DIAGNOSIS — J189 Pneumonia, unspecified organism: Secondary | ICD-10-CM | POA: Diagnosis not present

## 2023-09-19 DIAGNOSIS — I480 Paroxysmal atrial fibrillation: Secondary | ICD-10-CM | POA: Diagnosis not present

## 2023-09-19 DIAGNOSIS — J449 Chronic obstructive pulmonary disease, unspecified: Secondary | ICD-10-CM | POA: Diagnosis not present

## 2023-09-19 DIAGNOSIS — R1312 Dysphagia, oropharyngeal phase: Secondary | ICD-10-CM | POA: Diagnosis not present

## 2023-09-19 DIAGNOSIS — R2689 Other abnormalities of gait and mobility: Secondary | ICD-10-CM | POA: Diagnosis not present

## 2023-09-20 DIAGNOSIS — Z515 Encounter for palliative care: Secondary | ICD-10-CM | POA: Diagnosis not present

## 2023-09-23 DIAGNOSIS — M6281 Muscle weakness (generalized): Secondary | ICD-10-CM | POA: Diagnosis not present

## 2023-09-23 DIAGNOSIS — R1312 Dysphagia, oropharyngeal phase: Secondary | ICD-10-CM | POA: Diagnosis not present

## 2023-09-23 DIAGNOSIS — J189 Pneumonia, unspecified organism: Secondary | ICD-10-CM | POA: Diagnosis not present

## 2023-09-23 DIAGNOSIS — I480 Paroxysmal atrial fibrillation: Secondary | ICD-10-CM | POA: Diagnosis not present

## 2023-09-23 DIAGNOSIS — I5032 Chronic diastolic (congestive) heart failure: Secondary | ICD-10-CM | POA: Diagnosis not present

## 2023-09-23 DIAGNOSIS — J449 Chronic obstructive pulmonary disease, unspecified: Secondary | ICD-10-CM | POA: Diagnosis not present

## 2023-09-23 DIAGNOSIS — R2689 Other abnormalities of gait and mobility: Secondary | ICD-10-CM | POA: Diagnosis not present

## 2023-09-24 DIAGNOSIS — I4891 Unspecified atrial fibrillation: Secondary | ICD-10-CM | POA: Diagnosis not present

## 2023-09-24 DIAGNOSIS — I10 Essential (primary) hypertension: Secondary | ICD-10-CM | POA: Diagnosis not present

## 2023-09-26 DIAGNOSIS — I5032 Chronic diastolic (congestive) heart failure: Secondary | ICD-10-CM | POA: Diagnosis not present

## 2023-09-26 DIAGNOSIS — R2689 Other abnormalities of gait and mobility: Secondary | ICD-10-CM | POA: Diagnosis not present

## 2023-09-26 DIAGNOSIS — M6281 Muscle weakness (generalized): Secondary | ICD-10-CM | POA: Diagnosis not present

## 2023-09-26 DIAGNOSIS — R1312 Dysphagia, oropharyngeal phase: Secondary | ICD-10-CM | POA: Diagnosis not present

## 2023-09-26 DIAGNOSIS — I480 Paroxysmal atrial fibrillation: Secondary | ICD-10-CM | POA: Diagnosis not present

## 2023-09-26 DIAGNOSIS — J189 Pneumonia, unspecified organism: Secondary | ICD-10-CM | POA: Diagnosis not present

## 2023-09-26 DIAGNOSIS — J449 Chronic obstructive pulmonary disease, unspecified: Secondary | ICD-10-CM | POA: Diagnosis not present

## 2023-09-30 DIAGNOSIS — J449 Chronic obstructive pulmonary disease, unspecified: Secondary | ICD-10-CM | POA: Diagnosis not present

## 2023-09-30 DIAGNOSIS — J189 Pneumonia, unspecified organism: Secondary | ICD-10-CM | POA: Diagnosis not present

## 2023-09-30 DIAGNOSIS — R1312 Dysphagia, oropharyngeal phase: Secondary | ICD-10-CM | POA: Diagnosis not present

## 2023-09-30 DIAGNOSIS — M6281 Muscle weakness (generalized): Secondary | ICD-10-CM | POA: Diagnosis not present

## 2023-09-30 DIAGNOSIS — I480 Paroxysmal atrial fibrillation: Secondary | ICD-10-CM | POA: Diagnosis not present

## 2023-09-30 DIAGNOSIS — R2689 Other abnormalities of gait and mobility: Secondary | ICD-10-CM | POA: Diagnosis not present

## 2023-09-30 DIAGNOSIS — I5032 Chronic diastolic (congestive) heart failure: Secondary | ICD-10-CM | POA: Diagnosis not present

## 2023-10-01 DIAGNOSIS — Z515 Encounter for palliative care: Secondary | ICD-10-CM | POA: Diagnosis not present

## 2023-10-03 DIAGNOSIS — R278 Other lack of coordination: Secondary | ICD-10-CM | POA: Diagnosis not present

## 2023-10-03 DIAGNOSIS — R1312 Dysphagia, oropharyngeal phase: Secondary | ICD-10-CM | POA: Diagnosis not present

## 2023-10-03 DIAGNOSIS — J449 Chronic obstructive pulmonary disease, unspecified: Secondary | ICD-10-CM | POA: Diagnosis not present

## 2023-10-03 DIAGNOSIS — R2681 Unsteadiness on feet: Secondary | ICD-10-CM | POA: Diagnosis not present

## 2023-10-03 DIAGNOSIS — R2689 Other abnormalities of gait and mobility: Secondary | ICD-10-CM | POA: Diagnosis not present

## 2023-10-03 DIAGNOSIS — R488 Other symbolic dysfunctions: Secondary | ICD-10-CM | POA: Diagnosis not present

## 2023-10-03 DIAGNOSIS — M6259 Muscle wasting and atrophy, not elsewhere classified, multiple sites: Secondary | ICD-10-CM | POA: Diagnosis not present

## 2023-10-07 ENCOUNTER — Ambulatory Visit: Payer: PPO | Admitting: Pulmonary Disease

## 2023-10-07 DIAGNOSIS — M6259 Muscle wasting and atrophy, not elsewhere classified, multiple sites: Secondary | ICD-10-CM | POA: Diagnosis not present

## 2023-10-07 DIAGNOSIS — J449 Chronic obstructive pulmonary disease, unspecified: Secondary | ICD-10-CM | POA: Diagnosis not present

## 2023-10-07 DIAGNOSIS — R488 Other symbolic dysfunctions: Secondary | ICD-10-CM | POA: Diagnosis not present

## 2023-10-07 DIAGNOSIS — R1312 Dysphagia, oropharyngeal phase: Secondary | ICD-10-CM | POA: Diagnosis not present

## 2023-10-07 DIAGNOSIS — R278 Other lack of coordination: Secondary | ICD-10-CM | POA: Diagnosis not present

## 2023-10-07 DIAGNOSIS — R2689 Other abnormalities of gait and mobility: Secondary | ICD-10-CM | POA: Diagnosis not present

## 2023-10-07 DIAGNOSIS — R2681 Unsteadiness on feet: Secondary | ICD-10-CM | POA: Diagnosis not present

## 2023-10-08 DIAGNOSIS — M6259 Muscle wasting and atrophy, not elsewhere classified, multiple sites: Secondary | ICD-10-CM | POA: Diagnosis not present

## 2023-10-08 DIAGNOSIS — J449 Chronic obstructive pulmonary disease, unspecified: Secondary | ICD-10-CM | POA: Diagnosis not present

## 2023-10-08 DIAGNOSIS — R2689 Other abnormalities of gait and mobility: Secondary | ICD-10-CM | POA: Diagnosis not present

## 2023-10-08 DIAGNOSIS — R488 Other symbolic dysfunctions: Secondary | ICD-10-CM | POA: Diagnosis not present

## 2023-10-08 DIAGNOSIS — R2681 Unsteadiness on feet: Secondary | ICD-10-CM | POA: Diagnosis not present

## 2023-10-08 DIAGNOSIS — R1312 Dysphagia, oropharyngeal phase: Secondary | ICD-10-CM | POA: Diagnosis not present

## 2023-10-08 DIAGNOSIS — R278 Other lack of coordination: Secondary | ICD-10-CM | POA: Diagnosis not present

## 2023-10-09 ENCOUNTER — Ambulatory Visit: Payer: PPO

## 2023-10-09 VITALS — Ht 73.0 in | Wt 161.0 lb

## 2023-10-09 DIAGNOSIS — R278 Other lack of coordination: Secondary | ICD-10-CM | POA: Diagnosis not present

## 2023-10-09 DIAGNOSIS — R488 Other symbolic dysfunctions: Secondary | ICD-10-CM | POA: Diagnosis not present

## 2023-10-09 DIAGNOSIS — R2681 Unsteadiness on feet: Secondary | ICD-10-CM | POA: Diagnosis not present

## 2023-10-09 DIAGNOSIS — R2689 Other abnormalities of gait and mobility: Secondary | ICD-10-CM | POA: Diagnosis not present

## 2023-10-09 DIAGNOSIS — E1351 Other specified diabetes mellitus with diabetic peripheral angiopathy without gangrene: Secondary | ICD-10-CM | POA: Diagnosis not present

## 2023-10-09 DIAGNOSIS — Z0000 Encounter for general adult medical examination without abnormal findings: Secondary | ICD-10-CM | POA: Diagnosis not present

## 2023-10-09 DIAGNOSIS — J449 Chronic obstructive pulmonary disease, unspecified: Secondary | ICD-10-CM | POA: Diagnosis not present

## 2023-10-09 DIAGNOSIS — M6259 Muscle wasting and atrophy, not elsewhere classified, multiple sites: Secondary | ICD-10-CM | POA: Diagnosis not present

## 2023-10-09 DIAGNOSIS — R1312 Dysphagia, oropharyngeal phase: Secondary | ICD-10-CM | POA: Diagnosis not present

## 2023-10-09 NOTE — Progress Notes (Signed)
 Subjective:   Troy Stoke [name] is a 83 y.o. male who presents for Medicare Annual/Subsequent preventive examination.  Visit Complete: Virtual I connected with  Troy Vicenta Chol [name] on 10/09/23 by a audio enabled telemedicine application and verified that I am speaking with the correct person using two identifiers.  Patient Location: Skilled Nursing Facility  Provider Location: Office/Clinic  I discussed the limitations of evaluation and management by telemedicine. The patient expressed understanding and agreed to proceed.  Vital Signs: Because this visit was a virtual/telehealth visit, some criteria may be missing or patient reported. Any vitals not documented were not able to be obtained and vitals that have been documented are patient reported.    Cardiac Risk Factors include: advanced age (>74men, >65 women)     Objective:    Today's Vitals   10/09/23 1423  Weight: 161 lb (73 kg)  Height: 6' 1 (1.854 m)   Body mass index is 21.24 kg/m.     10/09/2023    2:32 PM 09/03/2023   12:00 AM 09/02/2023    4:52 PM 04/22/2023    3:35 PM 04/05/2023   11:53 AM 04/05/2023   11:35 AM 03/26/2023    7:41 AM  Advanced Directives  Does Patient Have a Medical Advance Directive? Yes Yes Yes Yes Yes Yes Yes  Type of Estate Agent of [place];Living will Healthcare Power of [employer] Power of [place] Power of [name];Living will  Does patient want to make changes to medical advance directive?  No - Patient declined   No - Patient declined    Copy of Healthcare Power of Attorney in Chart? No - copy requested No - copy requested     No - copy requested    Current Medications (verified) Outpatient Encounter Medications as of 10/09/2023  Medication Sig   acetaminophen  (TYLENOL ) 325 MG tablet Take 650 mg by mouth every 6 (six) hours as needed for moderate pain or headache.   acetaminophen  (TYLENOL ) 500 MG tablet Take 1 tablet (500 mg total) by  mouth every 6 (six) hours as needed.   albuterol  (VENTOLIN  HFA) 108 (90 Base) MCG/ACT inhaler Inhale 2 puffs into the lungs every 6 (six) hours as needed for wheezing or shortness of breath.   amLODipine  (NORVASC ) 10 MG tablet Take 10 mg by mouth daily.   apixaban  (ELIQUIS ) 2.5 MG TABS tablet Take 1 tablet (2.5 mg total) by mouth 2 (two) times daily. Okay to restart this medication on 03/27/2023   Brinzolamide -Brimonidine  (SIMBRINZA) 1-0.2 % SUSP Place 1 drop into both eyes 3 (three) times daily. 8a, 2p, 8p   Budeson-Glycopyrrol-Formoterol  (BREZTRI  AEROSPHERE) 160-9-4.8 MCG/ACT AERO Inhale 2 puffs into the lungs in the morning and at bedtime.   diclofenac  Sodium (VOLTAREN ) 1 % GEL Apply 2 g topically 4 (four) times daily. (Patient taking differently: Apply 2 g topically in the morning and at bedtime.)   docusate sodium  (COLACE) 100 MG capsule Take 100 mg by mouth 2 (two) times daily as needed for moderate constipation.   escitalopram  (LEXAPRO ) 20 MG tablet Take 1 tablet (20 mg total) by mouth daily.   furosemide  (LASIX ) 20 MG tablet Take 2 tablets (40 mg total) by mouth daily.   JARDIANCE  10 MG TABS tablet TAKE ONE TABLET BY MOUTH EVERY DAY BEFORE BREAKFAST.   latanoprost  (XALATAN ) 0.005 % ophthalmic solution Place 1 drop into both eyes at bedtime.   lidocaine  (LIDODERM ) 5 % Place 1 patch onto the skin daily. Remove & Discard  patch within 12 hours or as directed by MD   loperamide (IMODIUM A-D) 2 MG tablet Take 2 mg by mouth 2 (two) times daily as needed for diarrhea or loose stools.   Melatonin 10 MG TABS Take 20 mg by mouth at bedtime.   metoprolol  succinate (TOPROL -XL) 25 MG 24 hr tablet Take 0.5 tablets (12.5 mg total) by mouth daily.   montelukast  (SINGULAIR ) 10 MG tablet TAKE ONE TABLET BY MOUTH AT BEDTIME (Patient taking differently: Take 10 mg by mouth daily.)   omeprazole  (PRILOSEC) 20 MG capsule Take 20 mg by mouth daily.   Polyethyl Glycol-Propyl Glycol (SYSTANE ULTRA) 0.4-0.3 % SOLN  Place 1 drop into both eyes daily as needed (dry eyes).   potassium chloride  SA (KLOR-CON  M) 20 MEQ tablet TAKE TWO TABLETS BY MOUTH IN THE MORNING AND TAKE ONE TABLET IN THE EVENING (Patient taking differently: Take 40 mEq by mouth daily.)   PRESCRIPTION MEDICATION Pt uses CPAP machine at bedtime   rosuvastatin  (CRESTOR ) 40 MG tablet TAKE ONE TABLET BY MOUTH EVERY DAY   senna-docusate (SENOKOT-S) 8.6-50 MG tablet Take 1 tablet by mouth daily.   spironolactone  (ALDACTONE ) 25 MG tablet Take 25 mg by mouth daily.   No facility-administered encounter medications on file as of 10/09/2023.    Allergies (verified) Niacin-lovastatin er, Penicillins, Sulfonamide derivatives, and Lipitor [atorvastatin ]   History: Past Medical History:  Diagnosis Date   AAA (abdominal aortic aneurysm) (HCC) 10-20-17   MONITORED BY DR ELIZA, 3.7 CM by ABDOMINAL US  11/2021   Anxiety    Arthritis    Atrial fibrillation (HCC)    Basal cell carcinoma    BPH (benign prostatic hypertrophy)    Chronic diastolic heart failure (HCC)    Chronic kidney disease    stage 3, pt unaware   Colon cancer (HCC)    COPD (chronic obstructive pulmonary disease) (HCC)    Depression    GERD (gastroesophageal reflux disease)    Glaucoma BOTH EYES   Dr Roz   History of lung cancer APRIL 2012   ONCOLOGIST- DR PERNELL  LOV [facility] 10-27-2012 SQUAMOUS CELL---- S/P RIGHT LOWER LOBECTOMY AT [facility] -- NO CHEMORADIATION--- NO RECURRENCE   Hx of adenomatous colonic polyps 2005    X 2; 1 hyperplastic polyp; Dr Obie   Hyperlipidemia    Hypertension    Nausea & vomiting 09/02/2023   OSA (obstructive sleep apnea) 08/29/2015   CPAP SET ON 10   Peripheral vascular disease (HCC)    FOLLOWED  BY DR DICKSON   Spinal stenosis 06/2014   Thoracic aorta atherosclerosis (HCC)    Thyrotoxicosis    amiodarone  induced   Past Surgical History:  Procedure Laterality Date   ABDOMINAL AORTOGRAM W/LOWER EXTREMITY N/A 12/20/2022   Procedure:  ABDOMINAL AORTOGRAM W/LOWER EXTREMITY;  Surgeon: Gretta Lonni PARAS, MD;  [facility];  Service: Cardiovascular;  Laterality: N/A;   ANGIO PLASTY     X 4 in legs   AORTOGRAM  07-27-2002   MILD DIFFUSE ILIAC ARTERY OCCLUSIVE DISEASE /  LEFT RENAL ARTERY 20%/ PATENT LEFT FEM-POP GRAFT/ MILD SFA AND POPLITEAL ARTERY OCCLUSIVE DISEASE W/ SEVERE KIDNEY OCCLUSIVE DISEASE   ATRIAL FIBRILLATION ABLATION N/A 04/09/2019   Procedure: ATRIAL FIBRILLATION ABLATION;  Surgeon: Kelsie Agent, MD;  [facility];  Service: Cardiovascular;  Laterality: N/A;   ATRIAL FIBRILLATION ABLATION N/A 02/12/2020   Procedure: ATRIAL FIBRILLATION ABLATION;  Surgeon: Kelsie Agent, MD;  [facility];  Service: Cardiovascular;  Laterality: N/A;   BALLOON DILATION N/A 02/06/2022   Procedure: BALLOON Tamponade ampulla;  Surgeon: Wilhelmenia Aloha Raddle., MD;  Location: [facility];  Service: Gastroenterology;  Laterality: N/A;   BASAL CELL CARCINOMA EXCISION     MULTIPLE TIMES--  RIGHT FOREARM, CHEEKS, AND BACK   BIOPSY  06/25/2018   Procedure: BIOPSY;  Surgeon: Wilhelmenia Aloha Raddle., MD;  Location: [facility] ENDOSCOPY;  Service: Gastroenterology;;   BIOPSY  02/06/2022   Procedure: BIOPSY;  Surgeon: Wilhelmenia Aloha Raddle., MD;  Location: [facility];  Service: Gastroenterology;;   BRONCHIAL BIOPSY  03/26/2023   Procedure: BRONCHIAL BIOPSIES;  Surgeon: Shelah Lamar RAMAN, MD;  Location[facility] ENDOSCOPY;  Service: Pulmonary;;   BRONCHIAL BRUSHINGS  03/26/2023   Procedure: BRONCHIAL BRUSHINGS;  Surgeon: Shelah Lamar RAMAN, MD;  Location[facility]COPY;  Service: Pulmonary;;   BRONCHIAL NEEDLE ASPIRATION BIOPSY  03/26/2023   Procedure: BRONCHIAL NEEDLE ASPIRATION BIOPSIES;  Surgeon: Shelah Lamar RAMAN, MD;  Location: [facility] ENDOSCOPY;  Service: Pulmonary;;   BRONCHIAL WASHINGS  03/26/2023   Procedure: BRONCHIAL WASHINGS;  Surgeon: Shelah Lamar RAMAN, MD;  [facility];  Service: Pulmonary;;    CARDIOVASCULAR STRESS TEST  12-08-2012  DR MCLEAN   NORMAL LEXISCAN  WITH NO EXERCISE NUCLEAR STUDY/ EF 66%/   NO ISCHEMIA/ NO SIGNIFICANT CHANGE FROM PRIOR STUDY   CARDIOVERSION N/A 02/14/2018   Procedure: CARDIOVERSION;  Surgeon: Rolan Ezra RAMAN, MD;  Location: [facility]COPY;  Service: Cardiovascular;  Laterality: N/A;   CARDIOVERSION N/A 07/10/2022   Procedure: CARDIOVERSION;  Surgeon: Rolan Ezra RAMAN, MD;  Location: [facility] ENDOSCOPY;  Service: Cardiovascular;  Laterality: N/A;   CAROTID ANGIOGRAM N/A 07/10/2013   Procedure: CAROTID ANGIOGRAM;  Surgeon: Carlin FORBES Haddock, MD;  Location: [facility] CATH LAB;  Service: Cardiovascular;  Laterality: N/A;   CAROTID ENDARTERECTOMY Right 07-14-13   cea   CATARACT EXTRACTION W/ INTRAOCULAR LENS  IMPLANT, BILATERAL     colon polyectomy     COLONOSCOPY     COLONOSCOPY WITH PROPOFOL  N/A 06/25/2018   Procedure: COLONOSCOPY WITH PROPOFOL ;  Surgeon: Mansouraty, Aloha Raddle., MD;  Location: [facility] ENDOSCOPY;  Service: Gastroenterology;  Laterality: N/A;   CYSTOSCOPY W/ RETROGRADES Bilateral 01/21/2013   Procedure: CYSTOSCOPY WITH RETROGRADE PYELOGRAM;  Surgeon: Toribio Neysa Repine, MD;  Location: [facility];  Service: Urology;  Laterality: Bilateral;   CYSTO, BLADDER BIOPSY, BILATERAL RETROGRADE PYELOGRAM  RAD TECH FROM RADIOLOGY PER JOY   CYSTOSCOPY WITH BIOPSY N/A 01/21/2013   Procedure: CYSTOSCOPY WITH BIOPSY;  Surgeon: Toribio Neysa Repine, MD;  Location: [facility];  Service: Urology;  Laterality: N/A;   ENDARTERECTOMY Right 07/14/2013   Procedure: ENDARTERECTOMY CAROTID;  Surgeon: Lonni RAMAN Blade, MD;  Location: [facility] OR;  Service: Vascular;  Laterality: Right;   ENDOSCOPIC RETROGRADE CHOLANGIOPANCREATOGRAPHY (ERCP) WITH PROPOFOL  N/A 02/06/2022   Procedure: ENDOSCOPIC RETROGRADE CHOLANGIOPANCREATOGRAPHY (ERCP) WITH PROPOFOL ;  Surgeon: Wilhelmenia Aloha Raddle., MD;  Location: [facility] ENDOSCOPY;  Service: Gastroenterology;  Laterality: N/A;    EP IMPLANTABLE DEVICE N/A 08/12/2015   Procedure: Loop Recorder Insertion;  Surgeon: Lynwood Rakers, MD;  [facility];  Service: Cardiovascular;  Laterality: N/A;   ESOPHAGOGASTRODUODENOSCOPY Left 02/13/2022   Procedure: ESOPHAGOGASTRODUODENOSCOPY (EGD);  Surgeon: San Sandor GAILS, DO;  Location: [facility] ENDOSCOPY;  Service: Gastroenterology;  Laterality: Left;   ESOPHAGOGASTRODUODENOSCOPY (EGD) WITH PROPOFOL  N/A 06/25/2018   Procedure: ESOPHAGOGASTRODUODENOSCOPY (EGD) WITH PROPOFOL ;  Surgeon: Wilhelmenia Aloha Raddle., MD;  Location: [facility] ENDOSCOPY;  Service: Gastroenterology;  Laterality: N/A;   ESOPHAGOGASTRODUODENOSCOPY (EGD) WITH PROPOFOL  N/A 07/10/2018  Procedure: ESOPHAGOGASTRODUODENOSCOPY (EGD) WITH PROPOFOL ;  Surgeon: Teressa Toribio SQUIBB, MD;  Location: [facility];  Service: Endoscopy;  Laterality: N/A;   EUS N/A 07/10/2018   Procedure: UPPER ENDOSCOPIC ULTRASOUND (EUS) RADIAL;  Surgeon: Teressa Toribio SQUIBB, MD;  Location: [facility]OS[facility];  Service: Endoscopy;  Laterality: N/A;   EYE SURGERY Right    FEMORAL-POPLITEAL BYPASS GRAFT Left 1994  [facility]   AND 2001  IN [place]    FEMORAL-POPLITEAL BYPASS GRAFT Left 08/30/2015   Procedure: REVISION OF BYPASS GRAFT Left  FEMORAL-POPLITEAL ARTERY;  Surgeon: Lonni GORMAN Blade, MD;  Location: [facility] OR;  Service: Vascular;  Laterality: Left;   FINE NEEDLE ASPIRATION N/A 07/10/2018   Procedure: FINE NEEDLE ASPIRATION (FNA) LINEAR;  Surgeon: Teressa Toribio SQUIBB, MD;  Location: [facility];  Service: Endoscopy;  Laterality: N/A;   FLEXIBLE SIGMOIDOSCOPY N/A 12/12/2018   Procedure: FLEXIBLE SIGMOIDOSCOPY;  Surgeon: Teresa Lonni HERO, MD;  Location: [facility] ORS;  Service: General;  Laterality: N/A;   HEMOSTASIS CLIP PLACEMENT  02/13/2022   Procedure: HEMOSTASIS CLIP PLACEMENT;  Surgeon: San Sandor GAILS, DO;  [facility];  Service: Gastroenterology;;   HEMOSTASIS CONTROL  03/26/2023   Procedure: HEMOSTASIS CONTROL;  Surgeon: Shelah Lamar GORMAN, MD;  Location: [facility] ENDOSCOPY;  Service: Pulmonary;;   HOT HEMOSTASIS N/A 02/13/2022   Procedure: HOT HEMOSTASIS (ARGON PLASMA COAGULATION/BICAP);  Surgeon: San Sandor GAILS, DO;  Location: [facility] ENDOSCOPY;  Service: Gastroenterology;  Laterality: N/A;   LARYNGOSCOPY  06-27-2004   BX VOCAL CORD  (LEUKOPLAKIA)  PER PT NO ISSUES SINCE   LEFT HEART CATH AND CORONARY ANGIOGRAPHY N/A 08/20/2017   Procedure: LEFT HEART CATH AND CORONARY ANGIOGRAPHY;  Surgeon: Rolan Ezra GORMAN, MD;  Location: [facility] INVASIVE CV LAB;  Service: Cardiovascular;  Laterality: N/A;   LOOP RECORDER INSERTION N/A 04/09/2019   Procedure: LOOP RECORDER INSERTION;  Surgeon: Kelsie Agent, MD;  [facility];  Service: Cardiovascular;  Laterality: N/A;   LOOP RECORDER REMOVAL N/A 04/09/2019   Procedure: LOOP RECORDER REMOVAL;  Surgeon: Kelsie Agent, MD;  [facility];  Service: Cardiovascular;  Laterality: N/A;   LOWER EXTREMITY ANGIOGRAM Bilateral 08/29/2015   Procedure: Lower Extremity Angiogram;  Surgeon: Lonni GORMAN Blade, MD;  Location: [facility] INVASIVE CV LAB;  Service: Cardiovascular;  Laterality: Bilateral;   LUNG LOBECTOMY  01/24/2011    RIGHT UPPER LOBE  (SQUAMOUS CELL CARCINOMA) Dr Shanda , [place]. No chemotherapyor radiation   PANCREATIC STENT PLACEMENT  02/06/2022   Procedure: PANCREATIC STENT PLACEMENT;  Surgeon: Wilhelmenia Aloha Raddle., MD;  Location: [facility];  Service: Gastroenterology;;   [name] ANGIOPLASTY Right 07/14/2013   Procedure: PATCH ANGIOPLASTY;  Surgeon: Lonni GORMAN Blade, MD;  Location: [facility] OR;  Service: Vascular;  Laterality: Right;   PATCH ANGIOPLASTY Left 08/30/2015   Procedure: VEIN PATCH ANGIOPLASTY OF PROXIMAL Left BYPASS GRAFT;  Surgeon: Lonni GORMAN Blade, MD;  Location: [facility] OR;  Service: Vascular;  Laterality: Left;   PERIPHERAL VASCULAR CATHETERIZATION N/A 08/29/2015   Procedure: Abdominal Aortogram;  Surgeon: Lonni GORMAN Blade, MD;  Location: [facility] INVASIVE CV  LAB;  Service: Cardiovascular;  Laterality: N/A;   POLYPECTOMY  06/25/2018   Procedure: POLYPECTOMY;  Surgeon: Wilhelmenia Aloha Raddle., MD;  Location: [facility] ENDOSCOPY;  Service: Gastroenterology;;   POLYPECTOMY     POLYPECTOMY  02/13/2022   Procedure: POLYPECTOMY;  Surgeon: San Sandor GAILS, DO;  [facility];  Service: Gastroenterology;;   REMOVAL OF STONES  02/06/2022   Procedure: REMOVAL OF STONES;  Surgeon: Wilhelmenia Aloha Raddle., MD;  Location: [facility];  Service: Gastroenterology;;   MATIAS  02/13/2022   Procedure: MATIAS;  Surgeon: San Sandor GAILS, DO;  [facility];  Service: Gastroenterology;;   ANNETT  02/06/2022   Procedure: ANNETT;  Surgeon: Wilhelmenia Aloha Raddle., MD;  Location: [facility];  Service: Gastroenterology;;   SUBMUCOSAL INJECTION  06/25/2018   Procedure: SUBMUCOSAL INJECTION;  Surgeon: Wilhelmenia Aloha Raddle., MD;  Location: [facility] ENDOSCOPY;  Service: Gastroenterology;;   TEE WITHOUT CARDIOVERSION N/A 02/14/2018   Procedure: TRANSESOPHAGEAL ECHOCARDIOGRAM (TEE);  Surgeon: Rolan Ezra RAMAN, MD;  Location: [facility] ENDOSCOPY;  Service: Cardiovascular;  Laterality: N/A;   trabecular surgery     OS   TRANSTHORACIC ECHOCARDIOGRAM  12-29-2012  DR [facility]   MILD LVH/  LVSF NORMAL/ EF 55-60%/  GRADE I DIASTOLIC DYSFUNCTION   VIDEO BRONCHOSCOPY WITH RADIAL ENDOBRONCHIAL ULTRASOUND  03/26/2023   Procedure: VIDEO BRONCHOSCOPY WITH RADIAL ENDOBRONCHIAL ULTRASOUND;  Surgeon: Shelah Lamar RAMAN, MD;  [facility];  Service: Pulmonary;;   Family History  Problem Relation Age of Onset   Stroke Mother        mini strokes   Alcohol  abuse Father    Heart disease Father        MI after 83   Stroke Father    Hypertension Father    Heart attack Father    Heart disease Paternal Aunt        several   Hypertension Paternal Aunt        several   Stroke Paternal Aunt        several   Stroke Paternal Uncle        several   Heart  disease Paternal Uncle        several;2 had MI pre 24   Cancer Daughter 86       breast ca, also with benign sessile polyp    Colon cancer Daughter 48   Colon polyps Neg Hx    Esophageal cancer Neg Hx    Rectal cancer Neg Hx    Stomach cancer Neg Hx    Social History   Socioeconomic History   Marital status: Widowed    Spouse name: Gracelyn    Number of children: 3   Years of education: 12+   Highest education level: Not on file  Occupational History   Occupation: Retired    Comment: Owns a ambulance person, Sales executive, 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  Tobacco Use   Smoking status: Former    Current packs/day: 0.00    Average packs/day: 0.5 packs/day for 50.0 years (25.0 ttl pk-yrs)    Types: Cigarettes    Start date: 07/28/1964    Quit date: 07/28/2014    Years since quitting: 9.2    Passive exposure: Never   Smokeless tobacco: Never  Vaping Use   Vaping status: Never Used  Substance and Sexual Activity   Alcohol  use: Yes    Alcohol /week: 4.0 standard drinks of alcohol     Types: 1 Glasses of wine, 1 Cans of beer, 1 Shots of liquor, 1 Standard drinks or equivalent per week    Comment: socially, variable; moderately heavy use in the past   Drug use: No   Sexual activity: Not Currently  Other Topics Concern   Not on file  Social History Narrative   Patient lives at home a lone has caregivers   Patient has 3 children    Patient is right handed    [employer]  assistant living has caregivers 24 around the clock   Social Drivers of Health   Financial Resource Strain: Low Risk  (10/05/2022)   Overall Financial Resource Strain (CARDIA)    Difficulty of Paying Living Expenses: Not hard at all  Food Insecurity: No Food Insecurity (09/03/2023)   Hunger Vital Sign    Worried About Running Out of Food in the Last Year: Never true    Ran Out of Food in the Last Year: Never true  Transportation Needs: No Transportation Needs  (09/03/2023)   PRAPARE - Administrator, Civil Service (Medical): No    Lack of Transportation (Non-Medical): No  Physical Activity: Inactive (10/05/2022)   Exercise Vital Sign    Days of Exercise per Week: 0 days    Minutes of Exercise per Session: 0 min  Stress: No Stress Concern Present (10/05/2022)   [employer] of Occupational Health - Occupational Stress Questionnaire    Feeling of Stress : Not at all  Social Connections: Socially Isolated (10/05/2022)   Social Connection and Isolation Panel [NHANES]    Frequency of Communication with Friends and Family: More than three times a week    Frequency of Social Gatherings with Friends and Family: Once a week    Attends Religious Services: Never    Database Administrator or Organizations: No    Attends Banker Meetings: Never    Marital Status: Widowed    Tobacco Counseling Counseling given: Not Answered   Clinical Intake:  Pre-visit preparation completed: Yes  Pain : No/denies pain     BMI - recorded: 21.24 Nutritional Status: BMI of 19-24  Normal Nutritional Risks: Nausea/ vomitting/ diarrhea  How often do you need to have someone help you when you read instructions, pamphlets, or other written materials from your doctor or pharmacy?: 5 - Always  Interpreter Needed?: No  Information entered by :: Arminda Foglio, RMA   Activities of Daily Living    10/09/2023    2:28 PM 09/03/2023   12:00 AM  In your present state of health, do you have any difficulty performing the following activities:  Hearing? 0 1  Vision? 1 1  Difficulty concentrating or making decisions? 0 0  Walking or climbing stairs? 0   Dressing or bathing? 0   Doing errands, shopping? 0 1  Comment has care giver or daughter   Preparing Food and eating ? Y   Using the Toilet? Y   In the past six months, have you accidently leaked urine? Y   Do you have problems with loss of bowel control? Y   Managing your Medications? Y    Managing your Finances? Y   Housekeeping or managing your Housekeeping? Y     Patient Care Team: Geofm Glade PARAS, MD as PCP - General (Internal Medicine) Kelsie Agent, MD (Inactive) as PCP - Electrophysiology (Cardiology) Rolan Ezra RAMAN, MD as PCP - Advanced Heart Failure (Cardiology) Rolan Ezra RAMAN, MD as PCP - Cardiology (Cardiology) Medora Ned, MD as Referring Physician (Thoracic Surgery) Rolan Ezra RAMAN, MD as Consulting Physician (Cardiology) Duwayne Purchase, MD as Consulting Physician (Orthopedic Surgery) Cloretta Arley NOVAK, MD as Consulting Physician (Oncology) Bond, Purchase Mcardle, MD as Referring Physician (Ophthalmology) Fate Morna SAILOR, [facility] (Inactive) as Pharmacist (Pharmacist)  Indicate any recent Medical Services you may have received from other than [facility] providers in the past year (date may be approximate).     Assessment:   This is a routine wellness examination for [place].  Hearing/Vision  screen No results found.   Goals Addressed   None   Depression Screen    04/08/2023    1:01 PM 04/08/2023    1:00 PM 01/14/2023    2:45 PM 10/05/2022    2:10 PM 09/18/2021    3:15 PM 08/02/2020   11:24 AM 06/20/2018    4:22 PM  PHQ 2/9 Scores  PHQ - 2 Score 0 0 0 0 0 0 0  PHQ- 9 Score       0    Fall Risk    10/09/2023    2:32 PM 10/05/2022    2:05 PM 09/18/2021    3:17 PM 08/08/2020    1:45 PM 08/02/2020   11:24 AM  Fall Risk   Falls in the past year? 1 0 0 0 0  Number falls in past yr: 1 0 0 0 0  Injury with Fall? 1 0 0 0 0  Risk for fall due to :  No Fall Risks No Fall Risks Impaired vision;No Fall Risks No Fall Risks  Follow up Falls evaluation completed;Falls prevention discussed Falls prevention discussed Falls prevention discussed Falls evaluation completed Falls evaluation completed    MEDICARE RISK AT HOME: Medicare Risk at Home Any stairs in or around the home?: Yes Home free of loose throw rugs in walkways, pet beds, electrical cords, etc?:  Yes Adequate lighting in your home to reduce risk of falls?: Yes Life alert?: Yes Use of a cane, walker or w/c?: Yes (w/C) Grab bars in the bathroom?: Yes Shower chair or bench in shower?: Yes Elevated toilet seat or a handicapped toilet?: Yes  TIMED UP AND GO:  Was the test performed?  No    Cognitive Function:      08/18/2014    2:54 PM  Montreal Cognitive Assessment   Visuospatial/ Executive (0/5) 2  Naming (0/3) 3  Attention: Read list of digits (0/2) 2  Attention: Read list of letters (0/1) 1  Attention: Serial 7 subtraction starting at 100 (0/3) 3  Language: Repeat phrase (0/2) 2  Language : Fluency (0/1) 0  Abstraction (0/2) 2  Delayed Recall (0/5) 3  Orientation (0/6) 6  Total 24  Adjusted Score (based on education) 24      10/09/2023    2:33 PM 10/05/2022    2:13 PM  6CIT Screen  What Year? 0 points 0 points  What month? 0 points 0 points  What time?  0 points  Count back from 20  0 points  Months in reverse  0 points  Repeat phrase  0 points  Total Score  0 points    Immunizations Immunization History  Administered Date(s) Administered   Fluad Quad(high Dose 65+) 08/11/2020, 07/11/2022   Influenza Split 07/05/2017   Influenza Whole 07/28/2010   Influenza,inj,Quad PF,6+ Mos 07/07/2013, 07/30/2014   Influenza-Unspecified 07/02/2015, 07/01/2021   PFIZER(Purple Top)SARS-COV-2 Vaccination 10/18/2019, 11/08/2019, 06/14/2020, 04/14/2021   Pneumococcal Conjugate-13 11/28/2015   Pneumococcal Polysaccharide-23 10/02/2007   Td 03/14/2010   Tdap 02/12/2022    TDAP status: Up to date  Flu Vaccine status: Up to date  Pneumococcal vaccine status: Up to date  Covid-19 vaccine status: Completed vaccines  Qualifies for Shingles Vaccine? Yes   Zostavax completed No   Shingrix Completed?: No.    Education has been provided regarding the importance of this vaccine. Patient has been advised to call insurance company to determine out of pocket expense if they have  not yet received this vaccine. Advised may also receive vaccine at  local pharmacy or Health Dept. Verbalized acceptance and understanding.  Screening Tests Health Maintenance  Topic Date Due   FOOT EXAM  Never done   Zoster Vaccines- Shingrix (1 of 2) Never done   Diabetic kidney evaluation - Urine ACR  01/11/2023   OPHTHALMOLOGY EXAM  05/08/2023   COVID-19 Vaccine (5 - 2024-25 season) 06/02/2023   Colonoscopy  06/24/2023   INFLUENZA VACCINE  12/30/2023 (Originally 05/02/2023)   HEMOGLOBIN A1C  03/03/2024   Diabetic kidney evaluation - eGFR measurement  09/05/2024   Medicare Annual Wellness (AWV)  10/08/2024   DTaP/Tdap/Td (3 - Td or Tdap) 02/13/2032   Pneumonia Vaccine 57+ Years old  Completed   HPV VACCINES  Aged Out    Health Maintenance  Health Maintenance Due  Topic Date Due   FOOT EXAM  Never done   Zoster Vaccines- Shingrix (1 of 2) Never done   Diabetic kidney evaluation - Urine ACR  01/11/2023   OPHTHALMOLOGY EXAM  05/08/2023   COVID-19 Vaccine (5 - 2024-25 season) 06/02/2023   Colonoscopy  06/24/2023    Colorectal cancer screening: Type of screening: Colonoscopy. Completed 06/23/2020. Repeat every 3 years  Lung Cancer Screening: (Low Dose CT Chest recommended if Age 69-80 years, 20 pack-year currently smoking OR have quit w/in 15years.) does not qualify.   Lung Cancer Screening Referral: N/A  Additional Screening:  Hepatitis C Screening: does not qualify;   Vision Screening: Recommended annual ophthalmology exams for early detection of glaucoma and other disorders of the eye. Is the patient up to date with their annual eye exam?  Yes  Who is the provider or what is the name of the office in which the patient attends annual eye exams? Dr. Geneva If pt is not established with a provider, would they like to be referred to a provider to establish care? No .   Dental Screening: Recommended annual dental exams for proper oral hygiene    Community Resource Referral /  Chronic Care Management: CRR required this visit?  No   CCM required this visit?  No     Plan:     I have personally reviewed and noted the following in the patient's chart:   Medical and social history Use of alcohol , tobacco or illicit drugs  Current medications and supplements including opioid prescriptions. Patient is not currently taking opioid prescriptions. Functional ability and status Nutritional status Physical activity Advanced directives List of other physicians Hospitalizations, surgeries, and ER visits in previous 12 months Vitals Screenings to include cognitive, depression, and falls Referrals and appointments  In addition, I have reviewed and discussed with patient certain preventive protocols, quality metrics, and best practice recommendations. A written personalized care plan for preventive services as well as general preventive health recommendations were provided to patient.     Kemonte Ullman L Crist Kruszka, CMA   10/09/2023   After Visit Summary: (MyChart) Due to this being a telephonic visit, the after visit summary with patients personalized plan was offered to patient via MyChart   Nurse Notes: Patient is due for a UACR and order has been placed.  Patient stated that he has had a more recent colonoscopy than 2021.  I see no documentation in chart.  He also stated that he has been to see eye doctor this past year.  Records have been requested today.  Patient is also due for a foot examine.  He had no concerns to address today.

## 2023-10-09 NOTE — Patient Instructions (Signed)
 Troy [name] , Thank you for taking time to come for your Medicare Wellness Visit. I appreciate your ongoing commitment to your health goals. Please review the following plan we discussed and let me know if I can assist you in the future.   Referrals/Orders/Follow-Ups/Clinician Recommendations: You are due for a foot exam and a Shingles vaccine.    This is a list of the screening recommended for you and due dates:  Health Maintenance  Topic Date Due   Yearly kidney health urinalysis for diabetes  01/11/2023   Eye exam for diabetics  05/08/2023   Colon Cancer Screening  06/24/2023   Flu Shot  12/30/2023*   COVID-19 Vaccine (6 - 2024-25 season) 12/30/2023*   Complete foot exam   01/29/2024*   Zoster (Shingles) Vaccine (1 of 2) 01/29/2024*   Hemoglobin A1C  03/03/2024   Yearly kidney function blood test for diabetes  09/05/2024   Medicare Annual Wellness Visit  10/08/2024   DTaP/Tdap/Td vaccine (3 - Td or Tdap) 02/13/2032   Pneumonia Vaccine  Completed   HPV Vaccine  Aged Out  *Topic was postponed. The date shown is not the original due date.    Advanced directives: (Copy Requested) Please bring a copy of your health care power of attorney and living will to the office to be added to your chart at your convenience.  Next Medicare Annual Wellness Visit scheduled for next year: Yes

## 2023-10-10 DIAGNOSIS — M6259 Muscle wasting and atrophy, not elsewhere classified, multiple sites: Secondary | ICD-10-CM | POA: Diagnosis not present

## 2023-10-10 DIAGNOSIS — J449 Chronic obstructive pulmonary disease, unspecified: Secondary | ICD-10-CM | POA: Diagnosis not present

## 2023-10-10 DIAGNOSIS — R1312 Dysphagia, oropharyngeal phase: Secondary | ICD-10-CM | POA: Diagnosis not present

## 2023-10-10 DIAGNOSIS — R488 Other symbolic dysfunctions: Secondary | ICD-10-CM | POA: Diagnosis not present

## 2023-10-10 DIAGNOSIS — R2689 Other abnormalities of gait and mobility: Secondary | ICD-10-CM | POA: Diagnosis not present

## 2023-10-10 DIAGNOSIS — R278 Other lack of coordination: Secondary | ICD-10-CM | POA: Diagnosis not present

## 2023-10-10 DIAGNOSIS — R2681 Unsteadiness on feet: Secondary | ICD-10-CM | POA: Diagnosis not present

## 2023-10-11 DIAGNOSIS — R2681 Unsteadiness on feet: Secondary | ICD-10-CM | POA: Diagnosis not present

## 2023-10-11 DIAGNOSIS — R2689 Other abnormalities of gait and mobility: Secondary | ICD-10-CM | POA: Diagnosis not present

## 2023-10-11 DIAGNOSIS — R488 Other symbolic dysfunctions: Secondary | ICD-10-CM | POA: Diagnosis not present

## 2023-10-11 DIAGNOSIS — R1312 Dysphagia, oropharyngeal phase: Secondary | ICD-10-CM | POA: Diagnosis not present

## 2023-10-11 DIAGNOSIS — M6259 Muscle wasting and atrophy, not elsewhere classified, multiple sites: Secondary | ICD-10-CM | POA: Diagnosis not present

## 2023-10-11 DIAGNOSIS — J449 Chronic obstructive pulmonary disease, unspecified: Secondary | ICD-10-CM | POA: Diagnosis not present

## 2023-10-11 DIAGNOSIS — R278 Other lack of coordination: Secondary | ICD-10-CM | POA: Diagnosis not present

## 2023-10-11 NOTE — Progress Notes (Addendum)
 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 No Pacemaker/ICD? He reports a loop recorder. Possible current pregnancy? No Is the patient on methotrexate? No  Current Complaints / other details:  None at this time.     BP 135/65   Pulse 61   Resp 19   SpO2 96%  Temp - 97.1

## 2023-10-13 DIAGNOSIS — R2689 Other abnormalities of gait and mobility: Secondary | ICD-10-CM | POA: Diagnosis not present

## 2023-10-13 DIAGNOSIS — R2681 Unsteadiness on feet: Secondary | ICD-10-CM | POA: Diagnosis not present

## 2023-10-13 DIAGNOSIS — M6259 Muscle wasting and atrophy, not elsewhere classified, multiple sites: Secondary | ICD-10-CM | POA: Diagnosis not present

## 2023-10-13 DIAGNOSIS — R278 Other lack of coordination: Secondary | ICD-10-CM | POA: Diagnosis not present

## 2023-10-13 DIAGNOSIS — J449 Chronic obstructive pulmonary disease, unspecified: Secondary | ICD-10-CM | POA: Diagnosis not present

## 2023-10-13 DIAGNOSIS — R488 Other symbolic dysfunctions: Secondary | ICD-10-CM | POA: Diagnosis not present

## 2023-10-13 DIAGNOSIS — R1312 Dysphagia, oropharyngeal phase: Secondary | ICD-10-CM | POA: Diagnosis not present

## 2023-10-13 NOTE — Progress Notes (Signed)
 Radiation Oncology         ([phone]) [phone] ________________________________  Follow-up New Visit   Outpatient   Name: Troy Wigfall [name] MRN: [id number]  Date: 10/14/2023  DOB: 1941/02/15  CC:Geofm Glade PARAS, MD  Cloretta Arley NOVAK, MD   REFERRING PHYSICIAN: Cloretta Arley NOVAK, MD  DIAGNOSIS: The encounter diagnosis was Malignant neoplasm of lower lobe of right lung [facility]).  Opacities and nodule in the right upper lobe suspicious on biopsy although not diagnostic for non- small cell lung cancer - now with mild progression of the RUL/perifissural nodule and mild progression of several smaller mediastinal lymph nodes suspicious for nodal metastatic disease.   History of right lung cancer SCC diagnosed in April 2012, s/p right lower lobectomy (did not receive any adjuvant therapy)   History of colon cancer diagnosed in September 2019  HISTORY OF PRESENT ILLNESS:Troy Johntavius Shepard [name] is a 83 y.o. male who is accompanied by his supportive daughter and caretaker. He retuns to day to further discuss the role of radiation therapy as part of management for his lung cancer. To review from his last visit here on 04/23/23, we discussed that he would be a candidate for SBRT or ultra hypofractionated radiation therapy to the RUL. He however expressed concern that radiation may worsen his respiratory status given his prior history of lung cancer.  Since his last visit, he has continued to follow with Dr. Cloretta under observation.   Of note: Dr. Cloretta also had submitted tissue around the time of our last visit to determine if he would be a candidate for a targeted therapy or immunotherapy. His PD-L1 score however returned at 0% and there was insufficient tissue for NGS testing.   This past November, he presented for a routine follow-up chest CT without contrast on 08/19/23 which demonstrated: mild progression of the right perifissural nodule, which remains concerning for primary bronchogenic carcinoma  vs metastatic disease, as well as mild progression of several smaller mediastinal lymph nodes suspicious for nodal metastatic disease. CT otherwise showed stable to mild improvement of the additional band like opacity in the posterior right upper lobe along the minor fissure (favoring post treatment changes).   After reviewing these findings with Dr. Audery on 08/28/23, the patient has again expressed interest in proceeding with observation and repeat imaging in 3-4 months.   In most recent history, the patient was hospitalized from 09/02/23 through 09/07/23 for management of PNA and acute respiratory failure after presenting to the ED with nausea, vomiting, cough, and shortness of breath x 3 days. A chest-x-ray was performed which showed a small pleural effusion with infiltrate in the right lower lobe and perihilar infiltration. His hospital course consisted of levofloxacin  which was later changed to ceftriaxone  and azithromycin . He was also discharged to his SNF with oral antibiotics.  -- He also had a barrium swallow study performed while inpatient which showed oropharyngeal phase dysphagia.    Of note: He also presented to the ED on 08/26/23 after suffering a fall. A CT of the head and c-spine were performed at that time which showed a T3 compression fracture. A chest x-ray was also performed which showed evidence of pulmonary edema.    PREVIOUS RADIATION THERAPY: No  PAST MEDICAL HISTORY:  Past Medical History:  Diagnosis Date   AAA (abdominal aortic aneurysm) (HCC) 10-20-17   MONITORED BY DR ELIZA, 3.7 CM by ABDOMINAL US  11/2021   Anxiety    Arthritis    Atrial fibrillation (HCC)    Basal  cell carcinoma    BPH (benign prostatic hypertrophy)    Chronic diastolic heart failure (HCC)    Chronic kidney disease    stage 3, pt unaware   Colon cancer (HCC)    COPD (chronic obstructive pulmonary disease) (HCC)    Depression    GERD (gastroesophageal reflux disease)    Glaucoma BOTH EYES    Dr Roz   History of lung cancer APRIL 2012   ONCOLOGIST- DR PERNELL  LOV IN [facility] 10-27-2012 SQUAMOUS CELL---- S/P RIGHT LOWER LOBECTOMY AT [facility] -- NO CHEMORADIATION--- NO RECURRENCE   Hx of adenomatous colonic polyps 2005    X 2; 1 hyperplastic polyp; Dr Obie   Hyperlipidemia    Hypertension    Nausea & vomiting 09/02/2023   OSA (obstructive sleep apnea) 08/29/2015   CPAP SET ON 10   Peripheral vascular disease (HCC)    FOLLOWED  BY DR DICKSON   Spinal stenosis 06/2014   Thoracic aorta atherosclerosis (HCC)    Thyrotoxicosis    amiodarone  induced    PAST SURGICAL HISTORY: Past Surgical History:  Procedure Laterality Date   ABDOMINAL AORTOGRAM W/LOWER EXTREMITY N/A 12/20/2022   Procedure: ABDOMINAL AORTOGRAM W/LOWER EXTREMITY;  Surgeon: Gretta Lonni PARAS, MD;  [facility];  Service: Cardiovascular;  Laterality: N/A;   ANGIO PLASTY     X 4 in legs   AORTOGRAM  07-27-2002   MILD DIFFUSE ILIAC ARTERY OCCLUSIVE DISEASE /  LEFT RENAL ARTERY 20%/ PATENT LEFT FEM-POP GRAFT/ MILD SFA AND POPLITEAL ARTERY OCCLUSIVE DISEASE W/ SEVERE KIDNEY OCCLUSIVE DISEASE   ATRIAL FIBRILLATION ABLATION N/A 04/09/2019   Procedure: ATRIAL FIBRILLATION ABLATION;  Surgeon: Kelsie Agent, MD;  [facility];  Service: Cardiovascular;  Laterality: N/A;   ATRIAL FIBRILLATION ABLATION N/A 02/12/2020   Procedure: ATRIAL FIBRILLATION ABLATION;  Surgeon: Kelsie Agent, MD;  [facility];  Service: Cardiovascular;  Laterality: N/A;   BALLOON DILATION N/A 02/06/2022   Procedure: BALLOON Tamponade ampulla;  Surgeon: Wilhelmenia Aloha Raddle., MD;  Location: [facility];  Service: Gastroenterology;  Laterality: N/A;   BASAL CELL CARCINOMA EXCISION     MULTIPLE TIMES--  RIGHT FOREARM, CHEEKS, AND BACK   BIOPSY  06/25/2018   Procedure: BIOPSY;  Surgeon: Wilhelmenia Aloha Raddle., MD;  Location: [facility] ENDOSCOPY;  Service: Gastroenterology;;   BIOPSY  02/06/2022   Procedure:  BIOPSY;  Surgeon: Wilhelmenia Aloha Raddle., MD;  Location: [facility];  Service: Gastroenterology;;   BRONCHIAL BIOPSY  03/26/2023   Procedure: BRONCHIAL BIOPSIES;  Surgeon: Shelah Lamar RAMAN, MD;  Location: [facility] ENDOSCOPY;  Service: Pulmonary;;   BRONCHIAL BRUSHINGS  03/26/2023   Procedure: BRONCHIAL BRUSHINGS;  Surgeon: Shelah Lamar RAMAN, MD;  Location: [facility] ENDOSCOPY;  Service: Pulmonary;;   BRONCHIAL NEEDLE ASPIRATION BIOPSY  03/26/2023   Procedure: BRONCHIAL NEEDLE ASPIRATION BIOPSIES;  Surgeon: Shelah Lamar RAMAN, MD;  Location: [facility] ENDOSCOPY;  Service: Pulmonary;;   BRONCHIAL WASHINGS  03/26/2023   Procedure: BRONCHIAL WASHINGS;  Surgeon: Shelah Lamar RAMAN, MD;  [facility];  Service: Pulmonary;;   CARDIOVASCULAR STRESS TEST  12-08-2012  DR MCLEAN   NORMAL LEXISCAN  WITH NO EXERCISE NUCLEAR STUDY/ EF 66%/   NO ISCHEMIA/ NO SIGNIFICANT CHANGE FROM PRIOR STUDY   CARDIOVERSION N/A 02/14/2018   Procedure: CARDIOVERSION;  Surgeon: Rolan Ezra RAMAN, MD;  Location: [facility] ENDOSCOPY;  Service: Cardiovascular;  Laterality: N/A;   CARDIOVERSION N/A 07/10/2022   Procedure: CARDIOVERSION;  Surgeon: Rolan Ezra RAMAN, MD;  Location: [facility] ENDOSCOPY;  Service: Cardiovascular;  Laterality:  N/A;   CAROTID ANGIOGRAM N/A 07/10/2013   Procedure: CAROTID ANGIOGRAM;  Surgeon: Carlin FORBES Haddock, MD;  Location: [facility] CATH LAB;  Service: Cardiovascular;  Laterality: N/A;   CAROTID ENDARTERECTOMY Right 07-14-13   cea   CATARACT EXTRACTION W/ INTRAOCULAR LENS  IMPLANT, BILATERAL     colon polyectomy     COLONOSCOPY     COLONOSCOPY WITH PROPOFOL  N/A 06/25/2018   Procedure: COLONOSCOPY WITH PROPOFOL ;  Surgeon: Wilhelmenia Aloha Raddle., MD;  Location: [facility] ENDOSCOPY;  Service: Gastroenterology;  Laterality: N/A;   CYSTOSCOPY W/ RETROGRADES Bilateral 01/21/2013   Procedure: CYSTOSCOPY WITH RETROGRADE PYELOGRAM;  Surgeon: Toribio Neysa Repine, MD;  Location: [facility];  Service: Urology;  Laterality: Bilateral;   CYSTO,  BLADDER BIOPSY, BILATERAL RETROGRADE PYELOGRAM  RAD TECH FROM RADIOLOGY PER JOY   CYSTOSCOPY WITH BIOPSY N/A 01/21/2013   Procedure: CYSTOSCOPY WITH BIOPSY;  Surgeon: Toribio Neysa Repine, MD;  Location: [facility];  Service: Urology;  Laterality: N/A;   ENDARTERECTOMY Right 07/14/2013   Procedure: ENDARTERECTOMY CAROTID;  Surgeon: Lonni GORMAN Blade, MD;  Location: [facility] OR;  Service: Vascular;  Laterality: Right;   ENDOSCOPIC RETROGRADE CHOLANGIOPANCREATOGRAPHY (ERCP) WITH PROPOFOL  N/A 02/06/2022   Procedure: ENDOSCOPIC RETROGRADE CHOLANGIOPANCREATOGRAPHY (ERCP) WITH PROPOFOL ;  Surgeon: Wilhelmenia Aloha Raddle., MD;  Location: [facility] ENDOSCOPY;  Service: Gastroenterology;  Laterality: N/A;   EP IMPLANTABLE DEVICE N/A 08/12/2015   Procedure: Loop Recorder Insertion;  Surgeon: Lynwood Rakers, MD;  [facility];  Service: Cardiovascular;  Laterality: N/A;   ESOPHAGOGASTRODUODENOSCOPY Left 02/13/2022   Procedure: ESOPHAGOGASTRODUODENOSCOPY (EGD);  Surgeon: San Sandor GAILS, DO;  Location: [facility] ENDOSCOPY;  Service: Gastroenterology;  Laterality: Left;   ESOPHAGOGASTRODUODENOSCOPY (EGD) WITH PROPOFOL  N/A 06/25/2018   Procedure: ESOPHAGOGASTRODUODENOSCOPY (EGD) WITH PROPOFOL ;  Surgeon: Wilhelmenia Aloha Raddle., MD;  Location: [name] ENDOSCOPY;  Service: Gastroenterology;  Laterality: N/A;   ESOPHAGOGASTRODUODENOSCOPY (EGD) WITH PROPOFOL  N/A 07/10/2018   Procedure: ESOPHAGOGASTRODUODENOSCOPY (EGD) WITH PROPOFOL ;  Surgeon: Teressa Toribio SQUIBB, MD;  Location: [facility] ENDOS[facility];  Service: Endoscopy;  Laterality: N/A;   EUS N/A 07/10/2018   Procedure: UPPER ENDOSCOPIC ULTRASOUND (EUS) RADIAL;  Surgeon: Teressa Toribio SQUIBB, MD;  Location: [facility] ENDOSCOPY;  Service: Endoscopy;  Laterality: N/A;   EYE SURGERY Right    FEMORAL-POPLITEAL BYPASS GRAFT Left 1994  [facility]   AND 2001  IN [place]    FEMORAL-POPLITEAL BYPASS GRAFT Left 08/30/2015   Procedure: REVISION OF BYPASS GRAFT Left  FEMORAL-POPLITEAL  ARTERY;  Surgeon: Lonni GORMAN Blade, MD;  Location: [facility] OR;  Service: Vascular;  Laterality: Left;   FINE NEEDLE ASPIRATION N/A 07/10/2018   Procedure: FINE NEEDLE ASPIRATION (FNA) LINEAR;  Surgeon: Teressa Toribio SQUIBB, MD;  Location: [facility];  Service: Endoscopy;  Laterality: N/A;   FLEXIBLE SIGMOIDOSCOPY N/A 12/12/2018   Procedure: FLEXIBLE SIGMOIDOSCOPY;  Surgeon: Teresa Lonni HERO, MD;  Location: [facility] ORS;  Service: General;  Laterality: N/A;   HEMOSTASIS CLIP PLACEMENT  02/13/2022   Procedure: HEMOSTASIS CLIP PLACEMENT;  Surgeon: San Sandor GAILS, DO;  Location[facility];  Service: Gastroenterology;;   HEMOSTASIS CONTROL  03/26/2023   Procedure: HEMOSTASIS CONTROL;  Surgeon: Shelah Lamar GORMAN, MD;  Location: [facility] ENDOSCOPY;  Service: Pulmonary;;   HOT HEMOSTASIS N/A 02/13/2022   Procedure: HOT HEMOSTASIS (ARGON PLASMA COAGULATION/BICAP);  Surgeon: San Sandor GAILS, DO;  Location: [facility] ENDOSCOPY;  Service: Gastroenterology;  Laterality: N/A;   LARYNGOSCOPY  06-27-2004   BX VOCAL CORD  (LEUKOPLAKIA)  PER PT NO ISSUES SINCE   LEFT HEART CATH AND CORONARY ANGIOGRAPHY N/A  08/20/2017   Procedure: LEFT HEART CATH AND CORONARY ANGIOGRAPHY;  Surgeon: Rolan Ezra RAMAN, MD;  Location: [facility] INVASIVE CV LAB;  Service: Cardiovascular;  Laterality: N/A;   LOOP RECORDER INSERTION N/A 04/09/2019   Procedure: LOOP RECORDER INSERTION;  Surgeon: Kelsie Agent, MD;  [facility];  Service: Cardiovascular;  Laterality: N/A;   LOOP RECORDER REMOVAL N/A 04/09/2019   Procedure: LOOP RECORDER REMOVAL;  Surgeon: Kelsie Agent, MD;  [facility];  Service: Cardiovascular;  Laterality: N/A;   LOWER EXTREMITY ANGIOGRAM Bilateral 08/29/2015   Procedure: Lower Extremity Angiogram;  Surgeon: Lonni RAMAN Blade, MD;  Location: [facility] INVASIVE CV LAB;  Service: Cardiovascular;  Laterality: Bilateral;   LUNG LOBECTOMY  01/24/2011    RIGHT UPPER LOBE  (SQUAMOUS CELL CARCINOMA) Dr Shanda , [facility]. No  chemotherapyor radiation   PANCREATIC STENT PLACEMENT  02/06/2022   Procedure: PANCREATIC STENT PLACEMENT;  Surgeon: Wilhelmenia Aloha Raddle., MD;  Location: [facility];  Service: Gastroenterology;;   [facility] ANGIOPLASTY Right 07/14/2013   Procedure: PATCH ANGIOPLASTY;  Surgeon: Lonni RAMAN Blade, MD;  Location: [facility] OR;  Service: Vascular;  Laterality: Right;   PATCH ANGIOPLASTY Left 08/30/2015   Procedure: VEIN PATCH ANGIOPLASTY OF PROXIMAL Left BYPASS GRAFT;  Surgeon: Lonni RAMAN Blade, MD;  Location: [facility] OR;  Service: Vascular;  Laterality: Left;   PERIPHERAL VASCULAR CATHETERIZATION N/A 08/29/2015   Procedure: Abdominal Aortogram;  Surgeon: Lonni RAMAN Blade, MD;  Location: [facility] INVASIVE CV LAB;  Service: Cardiovascular;  Laterality: N/A;   POLYPECTOMY  06/25/2018   Procedure: POLYPECTOMY;  Surgeon: Wilhelmenia Aloha Raddle., MD;  Location: [facility] ENDOSCOPY;  Service: Gastroenterology;;   POLYPECTOMY     POLYPECTOMY  02/13/2022   Procedure: POLYPECTOMY;  Surgeon: San Sandor GAILS, DO;  [facility];  Service: Gastroenterology;;   REMOVAL OF STONES  02/06/2022   Procedure: REMOVAL OF STONES;  Surgeon: Wilhelmenia Aloha Raddle., MD;  Location: [facility];  Service: Gastroenterology;;   MATIAS  02/13/2022   Procedure: MATIAS;  Surgeon: San Sandor GAILS, DO;  Location: [facility] ENDOSCOPY;  Service: Gastroenterology;;   ANNETT  02/06/2022   Procedure: ANNETT;  Surgeon: Wilhelmenia Aloha Raddle., MD;  Location: [facility];  Service: Gastroenterology;;   SUBMUCOSAL INJECTION  06/25/2018   Procedure: SUBMUCOSAL INJECTION;  Surgeon: Wilhelmenia Aloha Raddle., MD;  Location: [facility] ENDOSCOPY;  Service: Gastroenterology;;   TEE WITHOUT CARDIOVERSION N/A 02/14/2018   Procedure: TRANSESOPHAGEAL ECHOCARDIOGRAM (TEE);  Surgeon: Rolan Ezra RAMAN, MD;  Location: [facility] ENDOSCOPY;  Service: Cardiovascular;  Laterality: N/A;   trabecular surgery     OS   TRANSTHORACIC ECHOCARDIOGRAM   12-29-2012  DR [facility]   MILD LVH/  LVSF NORMAL/ EF 55-60%/  GRADE I DIASTOLIC DYSFUNCTION   VIDEO BRONCHOSCOPY WITH RADIAL ENDOBRONCHIAL ULTRASOUND  03/26/2023   Procedure: VIDEO BRONCHOSCOPY WITH RADIAL ENDOBRONCHIAL ULTRASOUND;  Surgeon: Shelah Lamar RAMAN, MD;  Location[facility] ENDOS[facility];  Service: Pulmonary;;    FAMILY HISTORY:  Family History  Problem Relation Age of Onset   Stroke Mother        mini strokes   Alcohol  abuse Father    Heart disease Father        MI after 45   Stroke Father    Hypertension Father    Heart attack Father    Heart disease Paternal Aunt        several   Hypertension Paternal Aunt        several   Stroke Paternal Aunt        several  Stroke Paternal Uncle        several   Heart disease Paternal Uncle        several;2 had MI pre 39   Colon cancer Daughter 8   Cancer Daughter 56       breast ca, also with benign sessile polyp    Cervical cancer Daughter    Colon polyps Neg Hx    Esophageal cancer Neg Hx    Rectal cancer Neg Hx    Stomach cancer Neg Hx     SOCIAL HISTORY:  Social History   Tobacco Use   Smoking status: Former    Current packs/day: 0.00    Average packs/day: 0.5 packs/day for 50.0 years (25.0 ttl pk-yrs)    Types: Cigarettes    Start date: 07/28/1964    Quit date: 07/28/2014    Years since quitting: 9.2    Passive exposure: Never   Smokeless tobacco: Never  Vaping Use   Vaping status: Never Used  Substance Use Topics   Alcohol  use: Yes    Alcohol /week: 4.0 standard drinks of alcohol     Types: 1 Glasses of wine, 1 Cans of beer, 1 Shots of liquor, 1 Standard drinks or equivalent per week    Comment: socially, variable; moderately heavy use in the past   Drug use: No    ALLERGIES:  Allergies  Allergen Reactions   Niacin-Lovastatin Er Shortness Of Breath and Other (See Comments)    Dyspnea Flushing  *Not listed on MAR    Penicillins Hives, Shortness Of Breath and Other (See Comments)    Dyspnea Flushing Because  of a history of documented adverse serious drug reaction;Medi Alert bracelet  is recommended PCN reaction causing immediate rash, facial/tongue/throat swelling, SOB or lightheadedness with hypotension: Yes PCN reaction causing severe rash involving mucus membranes or skin necrosis: No PCN reaction occurring within the last 10 years: NO PCN reaction that required hospitalization: NO   Sulfonamide Derivatives Hives, Shortness Of Breath and Other (See Comments)    Dyspnea  Flushing Because of a history of documented adverse serious drug reaction;Medi Alert bracelet  is recommended   Lipitor [Atorvastatin ] Other (See Comments)    Myalgias Arthralgias   *Not listed on MAR     MEDICATIONS:  Current Outpatient Medications  Medication Sig Dispense Refill   acetaminophen  (TYLENOL ) 500 MG tablet Take 1 tablet (500 mg total) by mouth every 6 (six) hours as needed. 30 tablet 0   albuterol  (VENTOLIN  HFA) 108 (90 Base) MCG/ACT inhaler Inhale 2 puffs into the lungs every 6 (six) hours as needed for wheezing or shortness of breath.     amLODipine  (NORVASC ) 10 MG tablet Take 10 mg by mouth daily.     apixaban  (ELIQUIS ) 2.5 MG TABS tablet Take 1 tablet (2.5 mg total) by mouth 2 (two) times daily. Okay to restart this medication on 03/27/2023 60 tablet    Brinzolamide -Brimonidine  (SIMBRINZA) 1-0.2 % SUSP Place 1 drop into both eyes 3 (three) times daily. 8a, 2p, 8p     Budeson-Glycopyrrol-Formoterol  (BREZTRI  AEROSPHERE) 160-9-4.8 MCG/ACT AERO Inhale 2 puffs into the lungs in the morning and at bedtime. 10.7 g 0   diclofenac  Sodium (VOLTAREN ) 1 % GEL Apply 2 g topically 4 (four) times daily. (Patient taking differently: Apply 2 g topically in the morning and at bedtime.)     escitalopram  (LEXAPRO ) 20 MG tablet Take 1 tablet (20 mg total) by mouth daily. 90 tablet 3   furosemide  (LASIX ) 20 MG tablet Take 2 tablets (40 mg  total) by mouth daily.     JARDIANCE  10 MG TABS tablet TAKE ONE TABLET BY MOUTH EVERY DAY  BEFORE BREAKFAST. 30 tablet 11   latanoprost  (XALATAN ) 0.005 % ophthalmic solution Place 1 drop into both eyes at bedtime.     lidocaine  (LIDODERM ) 5 % Place 1 patch onto the skin daily. Remove & Discard patch within 12 hours or as directed by MD 30 patch 0   loperamide (IMODIUM A-D) 2 MG tablet Take 2 mg by mouth 2 (two) times daily as needed for diarrhea or loose stools.     Melatonin 10 MG TABS Take 20 mg by mouth at bedtime.     metoprolol  succinate (TOPROL -XL) 25 MG 24 hr tablet Take 0.5 tablets (12.5 mg total) by mouth daily. 45 tablet 3   montelukast  (SINGULAIR ) 10 MG tablet TAKE ONE TABLET BY MOUTH AT BEDTIME (Patient taking differently: Take 10 mg by mouth daily.) 28 tablet 5   omeprazole  (PRILOSEC) 20 MG capsule Take 20 mg by mouth daily.     Polyethyl Glycol-Propyl Glycol (SYSTANE ULTRA) 0.4-0.3 % SOLN Place 1 drop into both eyes daily as needed (dry eyes).     potassium chloride  SA (KLOR-CON  M) 20 MEQ tablet TAKE TWO TABLETS BY MOUTH IN THE MORNING AND TAKE ONE TABLET IN THE EVENING (Patient taking differently: Take 40 mEq by mouth daily.) 90 tablet 6   PRESCRIPTION MEDICATION Pt uses CPAP machine at bedtime     rosuvastatin  (CRESTOR ) 40 MG tablet TAKE ONE TABLET BY MOUTH EVERY DAY 90 tablet 3   senna-docusate (SENOKOT-S) 8.6-50 MG tablet Take 1 tablet by mouth daily.     spironolactone  (ALDACTONE ) 25 MG tablet Take 25 mg by mouth daily.     acetaminophen  (TYLENOL ) 325 MG tablet Take 650 mg by mouth every 6 (six) hours as needed for moderate pain or headache. (Patient not taking: Reported on 10/14/2023)     docusate sodium  (COLACE) 100 MG capsule Take 100 mg by mouth 2 (two) times daily as needed for moderate constipation. (Patient not taking: Reported on 10/14/2023)     No current facility-administered medications for this encounter.    REVIEW OF SYSTEMS:  A 10+ POINT REVIEW OF SYSTEMS WAS OBTAINED including neurology, dermatology, psychiatry, cardiac, respiratory, lymph, extremities,  GI, GU, musculoskeletal, constitutional, reproductive, HEENT. The patient denies any pain.    PHYSICAL EXAM:  temporal temperature is 97.1 F (36.2 C) (abnormal). His blood pressure is 135/65 and his pulse is 61. His respiration is 19 and oxygen saturation is 96%.   In general this is a uncomfortable appearing male in no acute distress. He's alert and oriented x4 and appropriate throughout the examination. Cardiopulmonary assessment is negative for acute distress and he exhibits normal effort. remains in wheelchair today.    ECOG = 2  0 - Asymptomatic (Fully active, able to carry on all predisease activities without restriction)  1 - Symptomatic but completely ambulatory (Restricted in physically strenuous activity but ambulatory and able to carry out work of a light or sedentary nature. For example, light housework, office work)  2 - Symptomatic, <50% in bed during the day (Ambulatory and capable of all self care but unable to carry out any work activities. Up and about more than 50% of waking hours)  3 - Symptomatic, >50% in bed, but not bedbound (Capable of only limited self-care, confined to bed or chair 50% or more of waking hours)  4 - Bedbound (Completely disabled. Cannot carry on any self-care. Totally confined to  bed or chair)  5 - Death   Raylene MM, Creech RH, Tormey DC, et al. [phone]). Toxicity and response criteria of the [facility]. Am. DOROTHA Bridges. Oncol. 5 (6): 649-55  LABORATORY DATA:  Lab Results  Component Value Date   WBC 6.3 09/05/2023   HGB 11.0 (L) 09/05/2023   HCT 33.5 (L) 09/05/2023   MCV 91.5 09/05/2023   PLT 213 09/05/2023   NEUTROABS 9.9 (H) 09/02/2023   Lab Results  Component Value Date   NA 133 (L) 09/06/2023   K 4.0 09/06/2023   CL 99 09/06/2023   CO2 23 09/06/2023   GLUCOSE 111 (H) 09/06/2023   BUN 29 (H) 09/06/2023   CREATININE 1.47 (H) 09/06/2023   CALCIUM  9.0 09/06/2023      RADIOGRAPHY: No results found.     IMPRESSION: Opacities and nodule in the right upper lobe suspicious on biopsy although not diagnostic for non- small cell lung cancer - now with mild progression of the RUL/perifissural nodule and mild progression of several smaller mediastinal lymph nodes suspicious for nodal metastatic disease.  We have reviewed the patient's case and pertinent imaging. Most recent CT scan shows interval enlargement of the right perihilar mass as well as enlarged mediastinal lymph nodes. It is unclear the etiology of the lymphadenopathy. Dr. Shannon recommends PET imaging to further evaluation. If PET scan reveals metastatic disease, the patient would require a conventionally fractionated course of treatment lasting over 6 weeks to treat the enlarging lung mass and lymph nodes. However, after thorough discussion, the patient and his family would prefer to only treat the lung mass. Considering the patient's comorbidities, if PET confirms metastatic disease, the patient and their family would prefer to forgo treatment altogether, particularly since he already has problems with dysphagia.  Today, I talked to the patient and family about the findings and work-up thus far.  We discussed the natural history of enlarging lung nodules and enlarged lymph nodes and general treatment, highlighting the role of radiotherapy in the management.  We discussed the available radiation techniques, and focused on the details of logistics and delivery.  We reviewed the anticipated acute and late sequelae associated with radiation in this setting.  The patient was encouraged to ask questions that I answered to the best of my ability.   Of note, the patient does have a loop recorder in place. If patient does proceed with treatment, we will need to get clearance for this.   PLAN: PET scan ordered. I will call the patient with the results and proceed with treatment planning accordingly.    30 minutes of total time was spent for this patient  encounter, including preparation, face-to-face counseling with the patient and coordination of care, physical exam, and documentation of the encounter.   ------------------------------------------------   Leeroy Due, PA-C   Lynwood CHARM Shannon, PhD, MD   [facility]  Radiation Oncology Direct Dial: [phone]  Fax: [phone] [email or web address]    This document serves as a record of services personally performed by Lynwood Shannon, MD and Leeroy Due, PA-C. It was created on his behalf by Dorthy Fuse, a trained medical scribe. The creation of this record is based on the scribe's personal observations and the provider's statements to them. This document has been checked and approved by the attending provider.

## 2023-10-14 ENCOUNTER — Ambulatory Visit
Admission: RE | Admit: 2023-10-14 | Discharge: 2023-10-14 | Disposition: A | Discharge status: Home / Self Care | Payer: PPO | Source: Ambulatory Visit | Attending: Radiation Oncology | Admitting: Radiation Oncology

## 2023-10-14 ENCOUNTER — Encounter: Payer: Self-pay | Admitting: Radiation Oncology

## 2023-10-14 VITALS — BP 135/65 | HR 61 | Temp 97.1°F | Resp 19

## 2023-10-14 DIAGNOSIS — E785 Hyperlipidemia, unspecified: Secondary | ICD-10-CM | POA: Diagnosis not present

## 2023-10-14 DIAGNOSIS — K219 Gastro-esophageal reflux disease without esophagitis: Secondary | ICD-10-CM | POA: Diagnosis not present

## 2023-10-14 DIAGNOSIS — I739 Peripheral vascular disease, unspecified: Secondary | ICD-10-CM | POA: Diagnosis not present

## 2023-10-14 DIAGNOSIS — I130 Hypertensive heart and chronic kidney disease with heart failure and stage 1 through stage 4 chronic kidney disease, or unspecified chronic kidney disease: Secondary | ICD-10-CM | POA: Insufficient documentation

## 2023-10-14 DIAGNOSIS — Z803 Family history of malignant neoplasm of breast: Secondary | ICD-10-CM | POA: Diagnosis not present

## 2023-10-14 DIAGNOSIS — I714 Abdominal aortic aneurysm, without rupture, unspecified: Secondary | ICD-10-CM | POA: Insufficient documentation

## 2023-10-14 DIAGNOSIS — J449 Chronic obstructive pulmonary disease, unspecified: Secondary | ICD-10-CM | POA: Insufficient documentation

## 2023-10-14 DIAGNOSIS — C3431 Malignant neoplasm of lower lobe, right bronchus or lung: Secondary | ICD-10-CM | POA: Insufficient documentation

## 2023-10-14 DIAGNOSIS — Z923 Personal history of irradiation: Secondary | ICD-10-CM | POA: Diagnosis not present

## 2023-10-14 DIAGNOSIS — Z87891 Personal history of nicotine dependence: Secondary | ICD-10-CM | POA: Insufficient documentation

## 2023-10-14 DIAGNOSIS — Z85828 Personal history of other malignant neoplasm of skin: Secondary | ICD-10-CM | POA: Diagnosis not present

## 2023-10-14 DIAGNOSIS — Z79899 Other long term (current) drug therapy: Secondary | ICD-10-CM | POA: Diagnosis not present

## 2023-10-14 DIAGNOSIS — Z800 Family history of malignant neoplasm of digestive organs: Secondary | ICD-10-CM | POA: Diagnosis not present

## 2023-10-14 DIAGNOSIS — G4733 Obstructive sleep apnea (adult) (pediatric): Secondary | ICD-10-CM | POA: Insufficient documentation

## 2023-10-14 DIAGNOSIS — N400 Enlarged prostate without lower urinary tract symptoms: Secondary | ICD-10-CM | POA: Insufficient documentation

## 2023-10-14 DIAGNOSIS — I509 Heart failure, unspecified: Secondary | ICD-10-CM | POA: Insufficient documentation

## 2023-10-14 DIAGNOSIS — R112 Nausea with vomiting, unspecified: Secondary | ICD-10-CM | POA: Insufficient documentation

## 2023-10-14 DIAGNOSIS — Z791 Long term (current) use of non-steroidal anti-inflammatories (NSAID): Secondary | ICD-10-CM | POA: Insufficient documentation

## 2023-10-14 DIAGNOSIS — I5032 Chronic diastolic (congestive) heart failure: Secondary | ICD-10-CM | POA: Diagnosis not present

## 2023-10-14 DIAGNOSIS — Z860101 Personal history of adenomatous and serrated colon polyps: Secondary | ICD-10-CM | POA: Insufficient documentation

## 2023-10-14 DIAGNOSIS — Z85118 Personal history of other malignant neoplasm of bronchus and lung: Secondary | ICD-10-CM | POA: Insufficient documentation

## 2023-10-14 DIAGNOSIS — Z7901 Long term (current) use of anticoagulants: Secondary | ICD-10-CM | POA: Insufficient documentation

## 2023-10-14 DIAGNOSIS — N183 Chronic kidney disease, stage 3 unspecified: Secondary | ICD-10-CM | POA: Insufficient documentation

## 2023-10-14 DIAGNOSIS — Z85038 Personal history of other malignant neoplasm of large intestine: Secondary | ICD-10-CM | POA: Diagnosis not present

## 2023-10-14 DIAGNOSIS — C3411 Malignant neoplasm of upper lobe, right bronchus or lung: Secondary | ICD-10-CM | POA: Diagnosis not present

## 2023-10-15 ENCOUNTER — Encounter: Payer: Self-pay | Admitting: Internal Medicine

## 2023-10-15 DIAGNOSIS — R1312 Dysphagia, oropharyngeal phase: Secondary | ICD-10-CM | POA: Diagnosis not present

## 2023-10-15 DIAGNOSIS — R2689 Other abnormalities of gait and mobility: Secondary | ICD-10-CM | POA: Diagnosis not present

## 2023-10-15 DIAGNOSIS — M6259 Muscle wasting and atrophy, not elsewhere classified, multiple sites: Secondary | ICD-10-CM | POA: Diagnosis not present

## 2023-10-15 DIAGNOSIS — J449 Chronic obstructive pulmonary disease, unspecified: Secondary | ICD-10-CM | POA: Diagnosis not present

## 2023-10-15 DIAGNOSIS — R1310 Dysphagia, unspecified: Secondary | ICD-10-CM

## 2023-10-15 DIAGNOSIS — R2681 Unsteadiness on feet: Secondary | ICD-10-CM | POA: Diagnosis not present

## 2023-10-15 DIAGNOSIS — R278 Other lack of coordination: Secondary | ICD-10-CM | POA: Diagnosis not present

## 2023-10-15 DIAGNOSIS — R488 Other symbolic dysfunctions: Secondary | ICD-10-CM | POA: Diagnosis not present

## 2023-10-16 ENCOUNTER — Other Ambulatory Visit (HOSPITAL_COMMUNITY): Payer: Self-pay | Admitting: Internal Medicine

## 2023-10-16 DIAGNOSIS — R059 Cough, unspecified: Secondary | ICD-10-CM

## 2023-10-16 DIAGNOSIS — R2681 Unsteadiness on feet: Secondary | ICD-10-CM | POA: Diagnosis not present

## 2023-10-16 DIAGNOSIS — R488 Other symbolic dysfunctions: Secondary | ICD-10-CM | POA: Diagnosis not present

## 2023-10-16 DIAGNOSIS — M6259 Muscle wasting and atrophy, not elsewhere classified, multiple sites: Secondary | ICD-10-CM | POA: Diagnosis not present

## 2023-10-16 DIAGNOSIS — J449 Chronic obstructive pulmonary disease, unspecified: Secondary | ICD-10-CM | POA: Diagnosis not present

## 2023-10-16 DIAGNOSIS — R1310 Dysphagia, unspecified: Secondary | ICD-10-CM

## 2023-10-16 DIAGNOSIS — R2689 Other abnormalities of gait and mobility: Secondary | ICD-10-CM | POA: Diagnosis not present

## 2023-10-16 DIAGNOSIS — R1312 Dysphagia, oropharyngeal phase: Secondary | ICD-10-CM | POA: Diagnosis not present

## 2023-10-16 DIAGNOSIS — R278 Other lack of coordination: Secondary | ICD-10-CM | POA: Diagnosis not present

## 2023-10-17 DIAGNOSIS — J449 Chronic obstructive pulmonary disease, unspecified: Secondary | ICD-10-CM | POA: Diagnosis not present

## 2023-10-17 DIAGNOSIS — R2689 Other abnormalities of gait and mobility: Secondary | ICD-10-CM | POA: Diagnosis not present

## 2023-10-17 DIAGNOSIS — R1312 Dysphagia, oropharyngeal phase: Secondary | ICD-10-CM | POA: Diagnosis not present

## 2023-10-17 DIAGNOSIS — M6259 Muscle wasting and atrophy, not elsewhere classified, multiple sites: Secondary | ICD-10-CM | POA: Diagnosis not present

## 2023-10-17 DIAGNOSIS — R2681 Unsteadiness on feet: Secondary | ICD-10-CM | POA: Diagnosis not present

## 2023-10-17 DIAGNOSIS — R488 Other symbolic dysfunctions: Secondary | ICD-10-CM | POA: Diagnosis not present

## 2023-10-17 DIAGNOSIS — R278 Other lack of coordination: Secondary | ICD-10-CM | POA: Diagnosis not present

## 2023-10-18 ENCOUNTER — Telehealth: Payer: Self-pay | Admitting: Internal Medicine

## 2023-10-18 DIAGNOSIS — M6259 Muscle wasting and atrophy, not elsewhere classified, multiple sites: Secondary | ICD-10-CM | POA: Diagnosis not present

## 2023-10-18 DIAGNOSIS — R488 Other symbolic dysfunctions: Secondary | ICD-10-CM | POA: Diagnosis not present

## 2023-10-18 DIAGNOSIS — R1312 Dysphagia, oropharyngeal phase: Secondary | ICD-10-CM | POA: Diagnosis not present

## 2023-10-18 DIAGNOSIS — J449 Chronic obstructive pulmonary disease, unspecified: Secondary | ICD-10-CM | POA: Diagnosis not present

## 2023-10-18 DIAGNOSIS — R278 Other lack of coordination: Secondary | ICD-10-CM | POA: Diagnosis not present

## 2023-10-18 DIAGNOSIS — R2681 Unsteadiness on feet: Secondary | ICD-10-CM | POA: Diagnosis not present

## 2023-10-18 DIAGNOSIS — R2689 Other abnormalities of gait and mobility: Secondary | ICD-10-CM | POA: Diagnosis not present

## 2023-10-18 NOTE — Telephone Encounter (Signed)
Copied from CRM [id number]. Topic: General - Other >> Oct 18, 2023 11:23 AM [place] A wrote: Reason for CRM: "Allena Earing" called because said she has faxed over form to be signed regarding patients medication Senokot. Please complete and fax over

## 2023-10-18 NOTE — Telephone Encounter (Signed)
Paperwork faxed back today.

## 2023-10-20 DIAGNOSIS — R2689 Other abnormalities of gait and mobility: Secondary | ICD-10-CM | POA: Diagnosis not present

## 2023-10-20 DIAGNOSIS — R488 Other symbolic dysfunctions: Secondary | ICD-10-CM | POA: Diagnosis not present

## 2023-10-20 DIAGNOSIS — R2681 Unsteadiness on feet: Secondary | ICD-10-CM | POA: Diagnosis not present

## 2023-10-20 DIAGNOSIS — R278 Other lack of coordination: Secondary | ICD-10-CM | POA: Diagnosis not present

## 2023-10-20 DIAGNOSIS — M6259 Muscle wasting and atrophy, not elsewhere classified, multiple sites: Secondary | ICD-10-CM | POA: Diagnosis not present

## 2023-10-20 DIAGNOSIS — R1312 Dysphagia, oropharyngeal phase: Secondary | ICD-10-CM | POA: Diagnosis not present

## 2023-10-20 DIAGNOSIS — J449 Chronic obstructive pulmonary disease, unspecified: Secondary | ICD-10-CM | POA: Diagnosis not present

## 2023-10-21 DIAGNOSIS — J449 Chronic obstructive pulmonary disease, unspecified: Secondary | ICD-10-CM | POA: Diagnosis not present

## 2023-10-21 DIAGNOSIS — R2681 Unsteadiness on feet: Secondary | ICD-10-CM | POA: Diagnosis not present

## 2023-10-21 DIAGNOSIS — R2689 Other abnormalities of gait and mobility: Secondary | ICD-10-CM | POA: Diagnosis not present

## 2023-10-21 DIAGNOSIS — R488 Other symbolic dysfunctions: Secondary | ICD-10-CM | POA: Diagnosis not present

## 2023-10-21 DIAGNOSIS — R1312 Dysphagia, oropharyngeal phase: Secondary | ICD-10-CM | POA: Diagnosis not present

## 2023-10-21 DIAGNOSIS — R278 Other lack of coordination: Secondary | ICD-10-CM | POA: Diagnosis not present

## 2023-10-21 DIAGNOSIS — M6259 Muscle wasting and atrophy, not elsewhere classified, multiple sites: Secondary | ICD-10-CM | POA: Diagnosis not present

## 2023-10-22 DIAGNOSIS — R2689 Other abnormalities of gait and mobility: Secondary | ICD-10-CM | POA: Diagnosis not present

## 2023-10-22 DIAGNOSIS — R488 Other symbolic dysfunctions: Secondary | ICD-10-CM | POA: Diagnosis not present

## 2023-10-22 DIAGNOSIS — M6259 Muscle wasting and atrophy, not elsewhere classified, multiple sites: Secondary | ICD-10-CM | POA: Diagnosis not present

## 2023-10-22 DIAGNOSIS — R2681 Unsteadiness on feet: Secondary | ICD-10-CM | POA: Diagnosis not present

## 2023-10-22 DIAGNOSIS — R1312 Dysphagia, oropharyngeal phase: Secondary | ICD-10-CM | POA: Diagnosis not present

## 2023-10-22 DIAGNOSIS — J449 Chronic obstructive pulmonary disease, unspecified: Secondary | ICD-10-CM | POA: Diagnosis not present

## 2023-10-22 DIAGNOSIS — R278 Other lack of coordination: Secondary | ICD-10-CM | POA: Diagnosis not present

## 2023-10-23 DIAGNOSIS — R1312 Dysphagia, oropharyngeal phase: Secondary | ICD-10-CM | POA: Diagnosis not present

## 2023-10-23 DIAGNOSIS — R488 Other symbolic dysfunctions: Secondary | ICD-10-CM | POA: Diagnosis not present

## 2023-10-23 DIAGNOSIS — R2689 Other abnormalities of gait and mobility: Secondary | ICD-10-CM | POA: Diagnosis not present

## 2023-10-23 DIAGNOSIS — R278 Other lack of coordination: Secondary | ICD-10-CM | POA: Diagnosis not present

## 2023-10-23 DIAGNOSIS — J449 Chronic obstructive pulmonary disease, unspecified: Secondary | ICD-10-CM | POA: Diagnosis not present

## 2023-10-23 DIAGNOSIS — R2681 Unsteadiness on feet: Secondary | ICD-10-CM | POA: Diagnosis not present

## 2023-10-23 DIAGNOSIS — M6259 Muscle wasting and atrophy, not elsewhere classified, multiple sites: Secondary | ICD-10-CM | POA: Diagnosis not present

## 2023-10-24 ENCOUNTER — Encounter (HOSPITAL_COMMUNITY)
Admission: RE | Admit: 2023-10-24 | Discharge: 2023-10-24 | Disposition: A | Discharge status: Home / Self Care | Payer: PPO | Source: Ambulatory Visit | Attending: Radiology | Admitting: Radiology

## 2023-10-24 DIAGNOSIS — C3411 Malignant neoplasm of upper lobe, right bronchus or lung: Secondary | ICD-10-CM | POA: Diagnosis not present

## 2023-10-24 DIAGNOSIS — C3431 Malignant neoplasm of lower lobe, right bronchus or lung: Secondary | ICD-10-CM | POA: Diagnosis not present

## 2023-10-24 LAB — GLUCOSE, CAPILLARY: Glucose-Capillary: 135 mg/dL — ABNORMAL HIGH (ref 70–99)

## 2023-10-24 MED ORDER — FLUDEOXYGLUCOSE F - 18 (FDG) INJECTION
7.9800 | Freq: Once | INTRAVENOUS | Status: AC | PRN
  Administered 2023-10-24: 7.98 via INTRAVENOUS

## 2023-10-25 DIAGNOSIS — R488 Other symbolic dysfunctions: Secondary | ICD-10-CM | POA: Diagnosis not present

## 2023-10-25 DIAGNOSIS — R2681 Unsteadiness on feet: Secondary | ICD-10-CM | POA: Diagnosis not present

## 2023-10-25 DIAGNOSIS — M6259 Muscle wasting and atrophy, not elsewhere classified, multiple sites: Secondary | ICD-10-CM | POA: Diagnosis not present

## 2023-10-25 DIAGNOSIS — R2689 Other abnormalities of gait and mobility: Secondary | ICD-10-CM | POA: Diagnosis not present

## 2023-10-25 DIAGNOSIS — R1312 Dysphagia, oropharyngeal phase: Secondary | ICD-10-CM | POA: Diagnosis not present

## 2023-10-25 DIAGNOSIS — J449 Chronic obstructive pulmonary disease, unspecified: Secondary | ICD-10-CM | POA: Diagnosis not present

## 2023-10-25 DIAGNOSIS — R278 Other lack of coordination: Secondary | ICD-10-CM | POA: Diagnosis not present

## 2023-10-26 DIAGNOSIS — G4733 Obstructive sleep apnea (adult) (pediatric): Secondary | ICD-10-CM | POA: Diagnosis not present

## 2023-10-28 ENCOUNTER — Encounter: Payer: Self-pay | Admitting: Oncology

## 2023-10-28 DIAGNOSIS — J449 Chronic obstructive pulmonary disease, unspecified: Secondary | ICD-10-CM | POA: Diagnosis not present

## 2023-10-28 DIAGNOSIS — R488 Other symbolic dysfunctions: Secondary | ICD-10-CM | POA: Diagnosis not present

## 2023-10-28 DIAGNOSIS — R1312 Dysphagia, oropharyngeal phase: Secondary | ICD-10-CM | POA: Diagnosis not present

## 2023-10-28 DIAGNOSIS — R2689 Other abnormalities of gait and mobility: Secondary | ICD-10-CM | POA: Diagnosis not present

## 2023-10-28 DIAGNOSIS — R278 Other lack of coordination: Secondary | ICD-10-CM | POA: Diagnosis not present

## 2023-10-28 DIAGNOSIS — M6259 Muscle wasting and atrophy, not elsewhere classified, multiple sites: Secondary | ICD-10-CM | POA: Diagnosis not present

## 2023-10-28 DIAGNOSIS — R2681 Unsteadiness on feet: Secondary | ICD-10-CM | POA: Diagnosis not present

## 2023-10-29 DIAGNOSIS — J449 Chronic obstructive pulmonary disease, unspecified: Secondary | ICD-10-CM | POA: Diagnosis not present

## 2023-10-29 DIAGNOSIS — R2681 Unsteadiness on feet: Secondary | ICD-10-CM | POA: Diagnosis not present

## 2023-10-29 DIAGNOSIS — R2689 Other abnormalities of gait and mobility: Secondary | ICD-10-CM | POA: Diagnosis not present

## 2023-10-29 DIAGNOSIS — R278 Other lack of coordination: Secondary | ICD-10-CM | POA: Diagnosis not present

## 2023-10-29 DIAGNOSIS — R1312 Dysphagia, oropharyngeal phase: Secondary | ICD-10-CM | POA: Diagnosis not present

## 2023-10-29 DIAGNOSIS — R488 Other symbolic dysfunctions: Secondary | ICD-10-CM | POA: Diagnosis not present

## 2023-10-29 DIAGNOSIS — M6259 Muscle wasting and atrophy, not elsewhere classified, multiple sites: Secondary | ICD-10-CM | POA: Diagnosis not present

## 2023-10-30 DIAGNOSIS — J449 Chronic obstructive pulmonary disease, unspecified: Secondary | ICD-10-CM | POA: Diagnosis not present

## 2023-10-30 DIAGNOSIS — R2681 Unsteadiness on feet: Secondary | ICD-10-CM | POA: Diagnosis not present

## 2023-10-30 DIAGNOSIS — R1312 Dysphagia, oropharyngeal phase: Secondary | ICD-10-CM | POA: Diagnosis not present

## 2023-10-30 DIAGNOSIS — R488 Other symbolic dysfunctions: Secondary | ICD-10-CM | POA: Diagnosis not present

## 2023-10-30 DIAGNOSIS — R278 Other lack of coordination: Secondary | ICD-10-CM | POA: Diagnosis not present

## 2023-10-30 DIAGNOSIS — R2689 Other abnormalities of gait and mobility: Secondary | ICD-10-CM | POA: Diagnosis not present

## 2023-10-30 DIAGNOSIS — M6259 Muscle wasting and atrophy, not elsewhere classified, multiple sites: Secondary | ICD-10-CM | POA: Diagnosis not present

## 2023-10-31 DIAGNOSIS — R2689 Other abnormalities of gait and mobility: Secondary | ICD-10-CM | POA: Diagnosis not present

## 2023-10-31 DIAGNOSIS — R1312 Dysphagia, oropharyngeal phase: Secondary | ICD-10-CM | POA: Diagnosis not present

## 2023-10-31 DIAGNOSIS — R278 Other lack of coordination: Secondary | ICD-10-CM | POA: Diagnosis not present

## 2023-10-31 DIAGNOSIS — M6259 Muscle wasting and atrophy, not elsewhere classified, multiple sites: Secondary | ICD-10-CM | POA: Diagnosis not present

## 2023-10-31 DIAGNOSIS — J449 Chronic obstructive pulmonary disease, unspecified: Secondary | ICD-10-CM | POA: Diagnosis not present

## 2023-10-31 DIAGNOSIS — R488 Other symbolic dysfunctions: Secondary | ICD-10-CM | POA: Diagnosis not present

## 2023-10-31 DIAGNOSIS — R2681 Unsteadiness on feet: Secondary | ICD-10-CM | POA: Diagnosis not present

## 2023-11-01 ENCOUNTER — Telehealth: Payer: Self-pay | Admitting: Internal Medicine

## 2023-11-01 DIAGNOSIS — R488 Other symbolic dysfunctions: Secondary | ICD-10-CM | POA: Diagnosis not present

## 2023-11-01 DIAGNOSIS — J449 Chronic obstructive pulmonary disease, unspecified: Secondary | ICD-10-CM | POA: Diagnosis not present

## 2023-11-01 DIAGNOSIS — R2689 Other abnormalities of gait and mobility: Secondary | ICD-10-CM | POA: Diagnosis not present

## 2023-11-01 DIAGNOSIS — R1312 Dysphagia, oropharyngeal phase: Secondary | ICD-10-CM | POA: Diagnosis not present

## 2023-11-01 DIAGNOSIS — M6259 Muscle wasting and atrophy, not elsewhere classified, multiple sites: Secondary | ICD-10-CM | POA: Diagnosis not present

## 2023-11-01 DIAGNOSIS — R278 Other lack of coordination: Secondary | ICD-10-CM | POA: Diagnosis not present

## 2023-11-01 DIAGNOSIS — R2681 Unsteadiness on feet: Secondary | ICD-10-CM | POA: Diagnosis not present

## 2023-11-01 NOTE — Telephone Encounter (Signed)
Unfortunately he should be seen.  This could be pneumonia, COVID, RSV-it is impossible to tell without testing him and evaluating him in person

## 2023-11-01 NOTE — Telephone Encounter (Unsigned)
Copied from CRM [id number]. Topic: Clinical - Medical Advice >> Nov 01, 2023 10:43 AM Lennart Pall wrote: Reason for CRM: Claris Pong (cell [phone]) - Occupational Therapist at [facility]- Patient is a lot weaker this week, he is hallucinating- oxygen at rest is 88-90 - more congestion, coughing, phlegm, She is wanting to know if something can be called in for Patient or if he needs to be seen, Please call her back on her cell number provided

## 2023-11-03 ENCOUNTER — Emergency Department (HOSPITAL_COMMUNITY)
Admission: EM | Admit: 2023-11-03 | Discharge: 2023-11-03 | Disposition: A | Discharge status: Skilled Nursing Facility | Payer: PPO | Attending: Emergency Medicine | Admitting: Emergency Medicine

## 2023-11-03 ENCOUNTER — Other Ambulatory Visit: Payer: Self-pay

## 2023-11-03 ENCOUNTER — Encounter (HOSPITAL_COMMUNITY): Payer: Self-pay

## 2023-11-03 DIAGNOSIS — R319 Hematuria, unspecified: Secondary | ICD-10-CM

## 2023-11-03 DIAGNOSIS — N390 Urinary tract infection, site not specified: Secondary | ICD-10-CM | POA: Diagnosis not present

## 2023-11-03 DIAGNOSIS — R059 Cough, unspecified: Secondary | ICD-10-CM | POA: Diagnosis not present

## 2023-11-03 DIAGNOSIS — Z7901 Long term (current) use of anticoagulants: Secondary | ICD-10-CM | POA: Diagnosis not present

## 2023-11-03 LAB — URINALYSIS, ROUTINE W REFLEX MICROSCOPIC
Bacteria, UA: NONE SEEN
Bilirubin Urine: NEGATIVE
Glucose, UA: >=500 mg/dL — AB
Ketones, ur: NEGATIVE mg/dL
Nitrite: NEGATIVE
Protein, ur: 100 mg/dL — AB
RBC / HPF: >50 RBC/hpf (ref 0–5)
Specific Gravity, Urine: 1.02 (ref 1.005–1.030)
WBC, UA: >50 WBC/hpf (ref 0–5)
pH: 5 (ref 5.0–8.0)

## 2023-11-03 LAB — COMPREHENSIVE METABOLIC PANEL
ALT: 13 U/L (ref 0–44)
AST: 15 U/L (ref 15–41)
Albumin: 2.8 g/dL — ABNORMAL LOW (ref 3.5–5.0)
Alkaline Phosphatase: 73 U/L (ref 38–126)
Anion gap: 11 (ref 5–15)
BUN: 15 mg/dL (ref 8–23)
CO2: 22 mmol/L (ref 22–32)
Calcium: 8.8 mg/dL — ABNORMAL LOW (ref 8.9–10.3)
Chloride: 104 mmol/L (ref 98–111)
Creatinine, Ser: 1.35 mg/dL — ABNORMAL HIGH (ref 0.61–1.24)
GFR, Estimated: 52 mL/min — ABNORMAL LOW (ref >60)
Glucose, Bld: 123 mg/dL — ABNORMAL HIGH (ref 70–99)
Potassium: 4.1 mmol/L (ref 3.5–5.1)
Sodium: 137 mmol/L (ref 135–145)
Total Bilirubin: 0.7 mg/dL (ref 0.0–1.2)
Total Protein: 6.2 g/dL — ABNORMAL LOW (ref 6.5–8.1)

## 2023-11-03 LAB — CBC WITH DIFFERENTIAL/PLATELET
Abs Immature Granulocytes: 0.04 10*3/uL (ref 0.00–0.07)
Basophils Absolute: 0 10*3/uL (ref 0.0–0.1)
Basophils Relative: 1 %
Eosinophils Absolute: 0.1 10*3/uL (ref 0.0–0.5)
Eosinophils Relative: 2 %
HCT: 37.5 % — ABNORMAL LOW (ref 39.0–52.0)
Hemoglobin: 12 g/dL — ABNORMAL LOW (ref 13.0–17.0)
Immature Granulocytes: 1 %
Lymphocytes Relative: 7 %
Lymphs Abs: 0.4 10*3/uL — ABNORMAL LOW (ref 0.7–4.0)
MCH: 29.3 pg (ref 26.0–34.0)
MCHC: 32 g/dL (ref 30.0–36.0)
MCV: 91.7 fL (ref 80.0–100.0)
Monocytes Absolute: 0.2 10*3/uL (ref 0.1–1.0)
Monocytes Relative: 3 %
Neutro Abs: 5.5 10*3/uL (ref 1.7–7.7)
Neutrophils Relative %: 86 %
Platelets: 253 10*3/uL (ref 150–400)
RBC: 4.09 MIL/uL — ABNORMAL LOW (ref 4.22–5.81)
RDW: 16.2 % — ABNORMAL HIGH (ref 11.5–15.5)
WBC: 6.3 10*3/uL (ref 4.0–10.5)
nRBC: 0 % (ref 0.0–0.2)

## 2023-11-03 MED ORDER — CEPHALEXIN 500 MG PO CAPS: 500.0000 mg | ORAL_CAPSULE | Freq: Three times a day (TID) | ORAL | 0 refills | Status: AC

## 2023-11-03 MED ORDER — SODIUM CHLORIDE 0.9 % IV SOLN
1.0000 g | Freq: Once | INTRAVENOUS | Status: AC
  Administered 2023-11-03: 1 g via INTRAVENOUS
  Filled 2023-11-03: qty 10

## 2023-11-03 NOTE — ED Triage Notes (Signed)
Patient arrived by [facility] with complaint of hematuria. Patient takes eliquis and denies any dysuria or abdominal pain. Alert to baseline. Patient in no distress

## 2023-11-03 NOTE — ED Provider Notes (Signed)
[place] EMERGENCY DEPARTMENT AT [facility] Provider Note   CSN: [id number] Arrival date & time: 11/03/23  0865     History  No chief complaint on file.   Troy Truth Barot [name] is a 83 y.o. male.  Patient sent to the emergency department due to blood in his urine.  Patient is currently on Eliquis.  Patient denies any complaints.  He states that he may have noticed some burning when he urinates.  Patient is on Eliquis for atrial fibrillation.  Patient currently resides at [facility].  Patient has not had any fever or chills.  Patient denies any appetite change.  Patient is currently being evaluated by oncology.  The history is provided by the patient. No language interpreter was used.       Home Medications Prior to Admission medications   Medication Sig Start Date End Date Taking? Authorizing Provider  acetaminophen (TYLENOL) 500 MG tablet Take 1 tablet (500 mg total) by mouth every 6 (six) hours as needed. Patient taking differently: Take 500 mg by mouth every 6 (six) hours as needed for moderate pain (pain score 4-6), mild pain (pain score 1-3) or headache. 08/26/23  Yes Theresia Lo, Benetta Spar K, DO  albuterol (VENTOLIN HFA) 108 (90 Base) MCG/ACT inhaler Inhale 2 puffs into the lungs every 6 (six) hours as needed for wheezing or shortness of breath.   Yes [provider]  amLODipine (NORVASC) 10 MG tablet Take 10 mg by mouth daily.   Yes [provider]  apixaban (ELIQUIS) 2.5 MG TABS tablet Take 1 tablet (2.5 mg total) by mouth 2 (two) times daily. Okay to restart this medication on 03/27/2023 03/26/23  Yes [name], MD  Brinzolamide-Brimonidine [facility]) 1-0.2 % SUSP Place 1 drop into both eyes 3 (three) times daily. 8a, 2p, 8p   Yes [provider]  Budeson-Glycopyrrol-Formoterol (BREZTRI AEROSPHERE) 160-9-4.8 MCG/ACT AERO Inhale 2 puffs into the lungs in the morning and at bedtime. 07/27/21  Yes Icard, Bradley L, DO  diclofenac Sodium  (VOLTAREN) 1 % GEL Apply 2 g topically 4 (four) times daily. 06/07/23  Yes Burns, Bobette Mo, MD  docusate sodium (COLACE) 100 MG capsule Take 100 mg by mouth 2 (two) times daily as needed for moderate constipation.   Yes [provider]  escitalopram (LEXAPRO) 20 MG tablet Take 1 tablet (20 mg total) by mouth daily. 06/07/23  Yes Burns, Bobette Mo, MD  furosemide (LASIX) 40 MG tablet Take 40 mg by mouth daily.   Yes [provider]  JARDIANCE 10 MG TABS tablet TAKE ONE TABLET BY MOUTH EVERY DAY BEFORE BREAKFAST. 01/08/22  Yes Laurey Morale, MD  latanoprost (XALATAN) 0.005 % ophthalmic solution Place 1 drop into both eyes at bedtime.   Yes [provider]  lidocaine (LIDODERM) 5 % Place 1 patch onto the skin daily. Remove & Discard patch within 12 hours or as directed by MD 08/26/23  Yes Theresia Lo, Cecile Sheerer, DO  loperamide (IMODIUM A-D) 2 MG tablet Take 2 mg by mouth 2 (two) times daily as needed for diarrhea or loose stools.   Yes [provider]  Melatonin 10 MG TABS Take 20 mg by mouth at bedtime.   Yes [provider]  metoprolol succinate (TOPROL-XL) 25 MG 24 hr tablet Take 0.5 tablets (12.5 mg total) by mouth daily. 04/30/23  Yes Laurey Morale, MD  montelukast (SINGULAIR) 10 MG tablet TAKE ONE TABLET BY MOUTH AT BEDTIME Patient taking differently: Take 10 mg by mouth  daily. 08/20/22  Yes Burns, Bobette Mo, MD  omeprazole (PRILOSEC) 20 MG capsule Take 20 mg by mouth daily. 05/15/23  Yes [provider]  Polyethyl Glycol-Propyl Glycol (SYSTANE ULTRA) 0.4-0.3 % SOLN Place 1 drop into both eyes daily as needed (dry eyes).   Yes [provider]  potassium chloride SA (KLOR-CON M) 20 MEQ tablet TAKE TWO TABLETS BY MOUTH IN THE MORNING AND TAKE ONE TABLET IN THE EVENING Patient taking differently: Take 40 mEq by mouth daily. 05/10/22  Yes Laurey Morale, MD  rosuvastatin (CRESTOR) 40 MG tablet TAKE ONE TABLET BY MOUTH EVERY DAY 02/09/22  Yes  Laurey Morale, MD  senna-docusate (SENOKOT-S) 8.6-50 MG tablet Take 1 tablet by mouth daily as needed for mild constipation or moderate constipation.   Yes [provider]  spironolactone (ALDACTONE) 25 MG tablet Take 25 mg by mouth daily.   Yes [provider]  PRESCRIPTION MEDICATION Pt uses CPAP machine at bedtime    [provider]      Allergies    Niacin-lovastatin er, Penicillins, Sulfonamide derivatives, and Lipitor [atorvastatin]    Review of Systems   Review of Systems  Genitourinary:  Positive for hematuria.  All other systems reviewed and are negative.   Physical Exam Updated Vital Signs BP (!) 130/57 (BP Location: Left Arm)   Pulse 62   Temp 97.9 F (36.6 C)   Resp 16   SpO2 94%  Physical Exam Vitals and nursing note reviewed.  Constitutional:      Appearance: He is well-developed.  HENT:     Head: Normocephalic.  Cardiovascular:     Rate and Rhythm: Normal rate.  Pulmonary:     Effort: Pulmonary effort is normal.  Abdominal:     General: There is no distension.  Musculoskeletal:        General: Normal range of motion.     Cervical back: Normal range of motion.  Skin:    General: Skin is warm.  Neurological:     General: No focal deficit present.     Mental Status: He is alert and oriented to person, place, and time.  Psychiatric:        Mood and Affect: Mood normal.     ED Results / Procedures / Treatments   Labs (all labs ordered are listed, but only abnormal results are displayed) Labs Reviewed  CBC WITH DIFFERENTIAL/PLATELET - Abnormal; Notable for the following components:      Result Value   RBC 4.09 (*)    Hemoglobin 12.0 (*)    HCT 37.5 (*)    RDW 16.2 (*)    Lymphs Abs 0.4 (*)    All other components within normal limits  COMPREHENSIVE METABOLIC PANEL - Abnormal; Notable for the following components:   Glucose, Bld 123 (*)    Creatinine, Ser 1.35 (*)    Calcium 8.8 (*)    Total Protein 6.2 (*)     Albumin 2.8 (*)    GFR, Estimated 52 (*)    All other components within normal limits  URINALYSIS, ROUTINE W REFLEX MICROSCOPIC - Abnormal; Notable for the following components:   Color, Urine RED (*)    APPearance CLOUDY (*)    Glucose, UA >=500 (*)    Hgb urine dipstick MODERATE (*)    Protein, ur 100 (*)    Leukocytes,Ua TRACE (*)    All other components within normal limits  URINE CULTURE    EKG None  Radiology No results found.  Procedures Procedures    Medications Ordered in ED Medications  cefTRIAXone (ROCEPHIN) 1 g in sodium chloride 0.9 % 100 mL IVPB (1 g Intravenous New Bag/Given 11/03/23 1239)    ED Course/ Medical Decision Making/ A&P                                 Medical Decision Making Patient brought to the emergency department because he has blood in his urine.  Amount and/or Complexity of Data Reviewed Independent Historian:     Details: Patient is here with his daughter who is supportive.  He is brought in by EMS who helps provide history Labs: ordered. Decision-making details documented in ED Course.    Details: Labs ordered reviewed and interpreted UA shows greater than 50 red blood cells greater than 50 white blood cells.  Risk Risk Details: Patient given IV fluids x 500 cc.  Rocephin 1 g IV.  Urine culture is ordered.  I will give the patient a prescription for Keflex I have advised that his Eliquis should be held tonight and in the morning.  Return to the emergency department if bleeding worsens or change.           Final Clinical Impression(s) / ED Diagnoses Final diagnoses:  Hematuria, unspecified type  Urinary tract infection with hematuria, site unspecified    Rx / DC Orders ED Discharge Orders     None         Elson Areas, [place] 11/03/23 1449    Cathren Laine, MD 11/03/23 1544

## 2023-11-03 NOTE — ED Notes (Signed)
Pt urinated via urinal while in bed, reports burning/painful urination.

## 2023-11-03 NOTE — ED Notes (Signed)
 Report called to facility

## 2023-11-03 NOTE — ED Notes (Signed)
Pt was assisted to passenger seat of daughters vehicle. Pt ambulated to wheelchair and vehicle via stand and sit.

## 2023-11-03 NOTE — Discharge Instructions (Addendum)
Hold Eliquis today.  Return if any problems.  Have your Physician follow up on your urine culture

## 2023-11-04 ENCOUNTER — Ambulatory Visit: Payer: PPO | Admitting: Podiatry

## 2023-11-04 ENCOUNTER — Ambulatory Visit: Payer: PPO | Admitting: Family Medicine

## 2023-11-04 NOTE — Telephone Encounter (Signed)
Appointment was made on Friday with Troy [name] this morning.

## 2023-11-05 DIAGNOSIS — R2681 Unsteadiness on feet: Secondary | ICD-10-CM | POA: Diagnosis not present

## 2023-11-05 DIAGNOSIS — R488 Other symbolic dysfunctions: Secondary | ICD-10-CM | POA: Diagnosis not present

## 2023-11-05 DIAGNOSIS — R1312 Dysphagia, oropharyngeal phase: Secondary | ICD-10-CM | POA: Diagnosis not present

## 2023-11-05 DIAGNOSIS — M6259 Muscle wasting and atrophy, not elsewhere classified, multiple sites: Secondary | ICD-10-CM | POA: Diagnosis not present

## 2023-11-05 DIAGNOSIS — R278 Other lack of coordination: Secondary | ICD-10-CM | POA: Diagnosis not present

## 2023-11-05 LAB — URINE CULTURE: Culture: >=100000 — AB

## 2023-11-05 NOTE — Telephone Encounter (Signed)
Dr. Truett Perna trying to find an appointment date/time

## 2023-11-06 ENCOUNTER — Ambulatory Visit (HOSPITAL_COMMUNITY)
Admission: RE | Admit: 2023-11-06 | Discharge: 2023-11-06 | Disposition: A | Discharge status: Home / Self Care | Payer: PPO | Source: Ambulatory Visit | Attending: Internal Medicine | Admitting: Internal Medicine

## 2023-11-06 ENCOUNTER — Telehealth (HOSPITAL_BASED_OUTPATIENT_CLINIC_OR_DEPARTMENT_OTHER): Payer: Self-pay | Admitting: Emergency Medicine

## 2023-11-06 ENCOUNTER — Ambulatory Visit (HOSPITAL_COMMUNITY): Admission: RE | Admit: 2023-11-06 | Payer: PPO | Source: Ambulatory Visit

## 2023-11-06 ENCOUNTER — Encounter (HOSPITAL_COMMUNITY): Payer: Self-pay

## 2023-11-06 DIAGNOSIS — R1310 Dysphagia, unspecified: Secondary | ICD-10-CM

## 2023-11-06 NOTE — Telephone Encounter (Signed)
 Post ED Visit - Positive Culture Follow-up: Successful Patient Follow-Up  Culture assessed and recommendations reviewed by:  []  Rankin Dee, Pharm.D. []  Venetia Gully, Pharm.D., BCPS AQ-ID []  Garrel Crews, Pharm.D., BCPS []  Almarie Lunger, [address].D., BCPS []  [place], [address].D., BCPS, AAHIVP []  Rosaline Bihari, Pharm.D., BCPS, AAHIVP []  Vernell Meier, PharmD, BCPS []  Latanya Hint, PharmD, BCPS []  Donald Medley, PharmD, BCPS [x]  Koren Or, PharmD  Positive urine culture  []  Patient discharged without antimicrobial prescription and treatment is now indicated [x]  Organism is resistant to prescribed ED discharge antimicrobial []  Patient with positive blood cultures  Changes discussed with ED provider: Richerd Later New antibiotic prescription Macrobid one BID for seven days Called and faxed to [facility] Phone # [phone] Fax: [phone]  Contacted [place], date 11/06/2023, time 1400   Clotilda JAYSON Ebbing 11/06/2023, 6:26 PM

## 2023-11-07 DIAGNOSIS — M6259 Muscle wasting and atrophy, not elsewhere classified, multiple sites: Secondary | ICD-10-CM | POA: Diagnosis not present

## 2023-11-07 DIAGNOSIS — R1312 Dysphagia, oropharyngeal phase: Secondary | ICD-10-CM | POA: Diagnosis not present

## 2023-11-07 DIAGNOSIS — R2681 Unsteadiness on feet: Secondary | ICD-10-CM | POA: Diagnosis not present

## 2023-11-07 DIAGNOSIS — R278 Other lack of coordination: Secondary | ICD-10-CM | POA: Diagnosis not present

## 2023-11-07 DIAGNOSIS — R488 Other symbolic dysfunctions: Secondary | ICD-10-CM | POA: Diagnosis not present

## 2023-11-08 ENCOUNTER — Inpatient Hospital Stay: Payer: PPO | Attending: Oncology | Admitting: Oncology

## 2023-11-08 DIAGNOSIS — Z85118 Personal history of other malignant neoplasm of bronchus and lung: Secondary | ICD-10-CM | POA: Diagnosis not present

## 2023-11-08 DIAGNOSIS — J449 Chronic obstructive pulmonary disease, unspecified: Secondary | ICD-10-CM | POA: Diagnosis not present

## 2023-11-08 NOTE — Progress Notes (Signed)
 [facility]   HEMATOLOGY- ONCOLOGY TeleHEALTH OFFICE VISIT PROGRESS NOTE  I connected withNAME@ on 11/08/23 at  2:30 PM EST by telephone and verified that I am speaking with the correct person using two identifiers.   I discussed the limitations, risks, security and privacy concerns of performing an evaluation and management service by telemedicine and the availability of in-person appointments. I also discussed with the patient that there may be a patient responsible charge related to this service. The patient expressed understanding and agreed to proceed.  Other persons participating in the visit and their role in the encounter: Daughter  Patient's location: Home Provider's location: Office   Diagnosis: Non-small cell lung cancer  INTERVAL HISTORY:   Mr. Walgren is seen today for a telehealth visit per his request.  He was seen in the emergency room on 11/03/2023 with hematuria.  He is being treated for urinary tract infection.  His daughter reports Mr Mattern developed altered mental status at the time of the urinary tract infection.  The infection and his mental status have improved.  Mr Crewe underwent a restaging PET on 10/24/2023.  He was contacted by radiation oncology with the result.  His daughter reports Mr Cockerell has been upset by the PET findings.  She indicates he is now blind and has limited ability to ambulate.  It is difficult to get him into the wheelchair or car.    Lab Results:  Lab Results  Component Value Date   WBC 6.3 11/03/2023   HGB 12.0 (L) 11/03/2023   HCT 37.5 (L) 11/03/2023   MCV 91.7 11/03/2023   PLT 253 11/03/2023   NEUTROABS 5.5 11/03/2023    Imaging:   Medications: I have reviewed the patient's current medications.  Assessment/Plan: Adenocarcinoma of sigmoid colon-stage II (T3N0)  Colonoscopy 06/25/2018- multiple polyps in the ascending and descending colon, partially obstructing mass in the sigmoid colon-biopsy confirmed  adenocarcinoma CTs 06/25/2018-enlarging right paratracheal lymph node compared to November 2018, no other evidence of metastatic disease Low anterior resection 12/12/2018, stage II (T3N0) adenocarcinoma the sigmoid colon, no loss of mismatch repair protein expression, MSI-stable Colonoscopy 06/23/2020-polyps removed from the transverse colon, ascending colon, and cecum-tubular adenomas  CT chest 01/14/2020-stable mildly enlarged high right paratracheal node and right hilar node CT abdomen/pelvis 01/29/2020-no evidence of recurrent disease, stable right lower lobe nodule CT chest 01/24/2021-mild growth of 2 irregular basilar right upper lobe nodules, stable high right mediastinal node PET scan 02/08/2021- band of nodules in the right upper lobe including a 13 mm nodule with an SUV of 3.2 and a 13 mm nodule with an SUV of 1.4.,  12 mm nodule in the upper right mediastinum with an SUV of 3.5      2.  History of iron  deficiency anemia Secondary to bleeding from tumor, exacerbated by anticoagulation with eliquis   3. 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 Medora at [facility]. Last Ct chest 05/01/18 stable, without concerning evidence of recurrence  -CT 06/25/2018- nonspecific small bilateral nodules, enlarging superior right paratracheal node -EUS biopsy of the right paratracheal nodes (several rounded nodes measuring 4 cm in total) on 07/10/2018- negative for malignancy -CT chest 03/03/2019- stable postoperative appearance of the chest, unchanged mildly prominent right paratracheal lymph  node, no evidence of metastatic disease -CT chest 01/14/2020-stable surgical changes in the right chest, stable mild mediastinal and  right hilar adenopathy -CT chest 01/24/2021- mild interval growth of irregular nodular opacities in the right upper lobe along the minor fissure -PET 02/08/2021-band of hypermetabolic nodules in the inferior aspect of the right upper lobe-indeterminate, nodularity increased over time.  Hypermetabolic nodule in the right upper mediastinum, similar small right effusion -CT super D chest 03/21/2021-consolidation and nodularity in the posterior inferior right upper lobe slightly diminished, unchanged loculated small right effusion, fine clustered centrilobular nodularity concentrated in the left upper lobe-unchanged.  No enlarged mediastinal, hilar, or axillary nodes -Right pleural fluid 07/05/2021-reactive mesothelial cells -CT super D chest 09/12/2021-decrease in size of right paratracheal and left mediastinal nodes, stable small right pleural effusion, right lower lobectomy, similar soft tissue thickening in the inferior right upper lobe, upper lobe predominant centrilobular nodularity favored to represent bronchiolitis -CT super D chest 04/09/2022-increased nodular thickening at the minor fissure, increased masslike density in the inferior right upper lobe, stable right pleural effusion -PET 04/24/2022-enlarging bandlike opacity in the inferior right upper lobe low-level hypermetabolic activity-slightly increased from the previous PET, no hypermetabolic activity in the anterior nodularity, no evidence of metastatic disease, stable chronic loculated right pleural effusion -CT super D chest 07/20/2022-unchanged appearance of tracer avid bandlike opacity in the inferior right upper lobe-indeterminant, nodular area at the anterior basal right upper lobe unchanged -CT super D chest 02/05/2023-chronic right pleural effusion, progressive bandlike opacity in the right upper lobe along the minor fissure, progressive 16 mm right perifissural nodule -PET 03/25/2023-enlarging perifissural right lung nodule mildly  hypermetabolic, no hypermetabolic activity within the progressive bandlike opacity in the posterior right upper lobe, new ill-defined parenchymal opacities in the right middle lobe-likely inflammatory, no evidence of distant metastatic disease -03/26/2023: Bronchoscopy biopsy of right upper lobe nodule and right lung bandlike thickening-adenocarcinoma and squamous cell carcinoma insufficient tissue for NGS testing, PD-L1 0% -CT chest 08/19/2023: Slight increase in size of the [place] fissural nodule at the minor fissure, bandlike opacity in the posterior right upper lobe-stable versus mildly improved, mild progression of small mediastinal nodes -PET 10/24/2023: New nodularity measuring 7 mm at the posterior right upper lobe, stable AP window node, stable [place] fissural nodular density tween the right middle lobe and right upper lobe, stable pleural nodularity the right lung apex, stable right pleural effusion, minimal hypermetabolism associated with the mediastinal lymph node and nodular lung densities     4. COPD 5. Atrial fibrillation 6. Thyrotoxicosis-potentially amiodarone  related 7.  Multiple gastric polyps on endoscopy 06/23/2020-1 oozing polyp-hyperplastic polyp, biopsy from the gastric antrum revealed intestinal metaplasia EGD 02/13/2022-gastric polyps-1 oozing polyp resected, nonbleeding duodenal ulcer with a visible vessel injected with epinephrine  and treated with a heater probe, pathology revealed a hyperplastic gastric polyp 8.  Peripheral vascular disease 9.  Fall 2011 2524: T3 compression fracture 10.  Urinary tract infection 11/03/2023     Disposition Mr Flanagan has a history of non-small cell lung cancer.  He was confirmed to have adenocarcinoma and squamous cell carcinoma on biopsy of right lung nodular densities last summer.  He decided against radiation.  I reviewed the current PET images.  I reviewed the findings with Mr. Brands and his daughter.  There are multiple right lung densities  which are essentially unchanged as is a mediastinal lymph node.  There is very mild hypermetabolism associated with these lesions.  I suspect he has indolent non-small cell lung cancer.  We discussed treatment  options including observation, radiation, and chemotherapy.  Mr Mcnay has multiple comorbid conditions colluding COPD, limited mobility, and blindness.  His daughter understands he is poor overall medical condition and prognosis.  She says the family has discussed hospice care.  Mr Lough is comfortable with observation.  He would like to return for a follow-up visit 12/19/2023 as scheduled.  Available to see him in the interim as needed.   I discussed the assessment and treatment plan with the patient. The patient was provided an opportunity to ask questions and all were answered. The patient agreed with the plan and demonstrated an understanding of the instructions.   The patient was advised to call back or seek an in-person evaluation if the symptoms worsen or if the condition fails to improve as anticipated.  I provided 30 minutes of chart review, telephone, and documentation time during this encounter, and > 50% was spent counseling as documented under my assessment & plan.  Arley Hof MD  11/08/2023 3:22 PM

## 2023-11-11 DIAGNOSIS — R1312 Dysphagia, oropharyngeal phase: Secondary | ICD-10-CM | POA: Diagnosis not present

## 2023-11-11 DIAGNOSIS — H409 Unspecified glaucoma: Secondary | ICD-10-CM | POA: Diagnosis not present

## 2023-11-11 DIAGNOSIS — E119 Type 2 diabetes mellitus without complications: Secondary | ICD-10-CM | POA: Diagnosis not present

## 2023-11-11 DIAGNOSIS — R278 Other lack of coordination: Secondary | ICD-10-CM | POA: Diagnosis not present

## 2023-11-11 DIAGNOSIS — R2681 Unsteadiness on feet: Secondary | ICD-10-CM | POA: Diagnosis not present

## 2023-11-11 DIAGNOSIS — K5900 Constipation, unspecified: Secondary | ICD-10-CM | POA: Diagnosis not present

## 2023-11-11 DIAGNOSIS — G4700 Insomnia, unspecified: Secondary | ICD-10-CM | POA: Diagnosis not present

## 2023-11-11 DIAGNOSIS — K219 Gastro-esophageal reflux disease without esophagitis: Secondary | ICD-10-CM | POA: Diagnosis not present

## 2023-11-11 DIAGNOSIS — M6259 Muscle wasting and atrophy, not elsewhere classified, multiple sites: Secondary | ICD-10-CM | POA: Diagnosis not present

## 2023-11-11 DIAGNOSIS — I2510 Atherosclerotic heart disease of native coronary artery without angina pectoris: Secondary | ICD-10-CM | POA: Diagnosis not present

## 2023-11-11 DIAGNOSIS — I10 Essential (primary) hypertension: Secondary | ICD-10-CM | POA: Diagnosis not present

## 2023-11-11 DIAGNOSIS — J449 Chronic obstructive pulmonary disease, unspecified: Secondary | ICD-10-CM | POA: Diagnosis not present

## 2023-11-11 DIAGNOSIS — F339 Major depressive disorder, recurrent, unspecified: Secondary | ICD-10-CM | POA: Diagnosis not present

## 2023-11-11 DIAGNOSIS — I509 Heart failure, unspecified: Secondary | ICD-10-CM | POA: Diagnosis not present

## 2023-11-11 DIAGNOSIS — I4891 Unspecified atrial fibrillation: Secondary | ICD-10-CM | POA: Diagnosis not present

## 2023-11-11 DIAGNOSIS — R488 Other symbolic dysfunctions: Secondary | ICD-10-CM | POA: Diagnosis not present

## 2023-11-12 ENCOUNTER — Ambulatory Visit: Payer: PPO | Admitting: Podiatry

## 2023-11-12 ENCOUNTER — Telehealth (HOSPITAL_COMMUNITY): Payer: Self-pay | Admitting: Internal Medicine

## 2023-11-12 NOTE — Telephone Encounter (Signed)
On 11/06/23 Dollene Cleveland, SLP called to advise she spoke w/pt's daughter who cancelled this appt due to UTI/unable to get out of bed.  On 11/12/23 attempted to f/u with daughter, phone's VM box was full. Reached out directly to pt and he said he did not want this appt scheduled any time soon. AHARRIS

## 2023-12-13 ENCOUNTER — Ambulatory Visit: Payer: PPO | Admitting: Oncology

## 2023-12-19 ENCOUNTER — Ambulatory Visit: Payer: PPO | Admitting: Oncology

## 2024-03-09 DIAGNOSIS — R1310 Dysphagia, unspecified: Secondary | ICD-10-CM | POA: Diagnosis not present

## 2024-03-09 DIAGNOSIS — J449 Chronic obstructive pulmonary disease, unspecified: Secondary | ICD-10-CM | POA: Diagnosis not present

## 2024-03-09 DIAGNOSIS — I480 Paroxysmal atrial fibrillation: Secondary | ICD-10-CM | POA: Diagnosis not present

## 2024-05-21 DIAGNOSIS — N390 Urinary tract infection, site not specified: Secondary | ICD-10-CM | POA: Diagnosis not present

## 2024-10-08 ENCOUNTER — Encounter
